# Patient Record
Sex: Female | Born: 1935
Health system: Southern US, Community
[De-identification: ages and names within clinical notes are randomized; demographics above are authoritative.]

## PROBLEM LIST (undated history)

## (undated) DIAGNOSIS — F32A Depression, unspecified: Secondary | ICD-10-CM

## (undated) DIAGNOSIS — I10 Essential (primary) hypertension: Secondary | ICD-10-CM

## (undated) DIAGNOSIS — I890 Lymphedema, not elsewhere classified: Secondary | ICD-10-CM

## (undated) DIAGNOSIS — R519 Headache, unspecified: Secondary | ICD-10-CM

## (undated) DIAGNOSIS — I272 Pulmonary hypertension, unspecified: Secondary | ICD-10-CM

## (undated) DIAGNOSIS — E785 Hyperlipidemia, unspecified: Secondary | ICD-10-CM

## (undated) DIAGNOSIS — N281 Cyst of kidney, acquired: Secondary | ICD-10-CM

## (undated) DIAGNOSIS — D649 Anemia, unspecified: Secondary | ICD-10-CM

## (undated) DIAGNOSIS — M1990 Unspecified osteoarthritis, unspecified site: Secondary | ICD-10-CM

## (undated) DIAGNOSIS — K760 Fatty (change of) liver, not elsewhere classified: Secondary | ICD-10-CM

## (undated) DIAGNOSIS — I82409 Acute embolism and thrombosis of unspecified deep veins of unspecified lower extremity: Secondary | ICD-10-CM

## (undated) DIAGNOSIS — I509 Heart failure, unspecified: Secondary | ICD-10-CM

## (undated) DIAGNOSIS — R0600 Dyspnea, unspecified: Secondary | ICD-10-CM

## (undated) DIAGNOSIS — F419 Anxiety disorder, unspecified: Secondary | ICD-10-CM

## (undated) DIAGNOSIS — E039 Hypothyroidism, unspecified: Secondary | ICD-10-CM

## (undated) DIAGNOSIS — R918 Other nonspecific abnormal finding of lung field: Secondary | ICD-10-CM

## (undated) DIAGNOSIS — R51 Headache: Secondary | ICD-10-CM

## (undated) DIAGNOSIS — R490 Dysphonia: Secondary | ICD-10-CM

## (undated) DIAGNOSIS — K5900 Constipation, unspecified: Secondary | ICD-10-CM

## (undated) DIAGNOSIS — E7800 Pure hypercholesterolemia, unspecified: Secondary | ICD-10-CM

## (undated) DIAGNOSIS — F329 Major depressive disorder, single episode, unspecified: Secondary | ICD-10-CM

## (undated) DIAGNOSIS — R079 Chest pain, unspecified: Secondary | ICD-10-CM

## (undated) DIAGNOSIS — G4733 Obstructive sleep apnea (adult) (pediatric): Secondary | ICD-10-CM

## (undated) DIAGNOSIS — M5030 Other cervical disc degeneration, unspecified cervical region: Secondary | ICD-10-CM

## (undated) DIAGNOSIS — N200 Calculus of kidney: Secondary | ICD-10-CM

## (undated) DIAGNOSIS — N3010 Interstitial cystitis (chronic) without hematuria: Secondary | ICD-10-CM

## (undated) DIAGNOSIS — I519 Heart disease, unspecified: Secondary | ICD-10-CM

## (undated) HISTORY — PX: REPLACEMENT TOTAL KNEE: SUR1224

## (undated) HISTORY — PX: HEMORRHOID SURGERY: SHX153

## (undated) HISTORY — DX: Cyst of kidney, acquired: N28.1

## (undated) HISTORY — DX: Calculus of kidney: N20.0

## (undated) HISTORY — DX: Essential (primary) hypertension: I10

## (undated) HISTORY — PX: OTHER SURGICAL HISTORY: SHX169

## (undated) HISTORY — PX: BACK SURGERY: SHX140

## (undated) HISTORY — PX: BREAST REDUCTION SURGERY: SHX8

## (undated) HISTORY — PX: ABDOMINAL HYSTERECTOMY: SHX81

## (undated) HISTORY — DX: Other nonspecific abnormal finding of lung field: R91.8

## (undated) HISTORY — DX: Depression, unspecified: F32.A

## (undated) HISTORY — DX: Interstitial cystitis (chronic) without hematuria: N30.10

## (undated) HISTORY — PX: TONSILECTOMY/ADENOIDECTOMY WITH MYRINGOTOMY: SHX6125

## (undated) HISTORY — DX: Fatty (change of) liver, not elsewhere classified: K76.0

## (undated) HISTORY — DX: Anemia, unspecified: D64.9

## (undated) HISTORY — PX: APPENDECTOMY: SHX54

## (undated) HISTORY — DX: Anxiety disorder, unspecified: F41.9

## (undated) HISTORY — PX: EYE SURGERY: SHX253

## (undated) HISTORY — DX: Unspecified osteoarthritis, unspecified site: M19.90

## (undated) HISTORY — PX: CARDIAC CATHETERIZATION: SHX172

## (undated) HISTORY — DX: Pulmonary hypertension, unspecified: I27.20

## (undated) HISTORY — DX: Major depressive disorder, single episode, unspecified: F32.9

## (undated) HISTORY — PX: FRACTURE SURGERY: SHX138

## (undated) HISTORY — DX: Hyperlipidemia, unspecified: E78.5

## (undated) HISTORY — PX: KNEE ARTHROSCOPY: SUR90

## (undated) HISTORY — PX: JOINT REPLACEMENT: SHX530

## (undated) HISTORY — PX: EXCISIONAL HEMORRHOIDECTOMY: SHX1541

## (undated) HISTORY — PX: REDUCTION MAMMAPLASTY: SUR839

## (undated) HISTORY — DX: Hypothyroidism, unspecified: E03.9

## (undated) HISTORY — PX: CERVICAL SPINE SURGERY: SHX589

## (undated) MED FILL — Iron Sucrose Inj 20 MG/ML (Fe Equiv): INTRAVENOUS | Qty: 10 | Status: AC

---

## 1999-10-06 ENCOUNTER — Encounter: Payer: Self-pay | Admitting: Internal Medicine

## 1999-10-06 ENCOUNTER — Encounter: Admission: RE | Admit: 1999-10-06 | Discharge: 1999-10-06 | Payer: Self-pay | Admitting: Internal Medicine

## 2004-07-31 ENCOUNTER — Ambulatory Visit: Payer: Self-pay | Admitting: Internal Medicine

## 2004-08-21 ENCOUNTER — Ambulatory Visit: Payer: Self-pay | Admitting: Internal Medicine

## 2004-11-29 ENCOUNTER — Ambulatory Visit: Payer: Self-pay

## 2005-01-24 ENCOUNTER — Emergency Department: Payer: Self-pay | Admitting: Emergency Medicine

## 2005-03-08 ENCOUNTER — Ambulatory Visit: Payer: Self-pay | Admitting: Gastroenterology

## 2005-03-13 ENCOUNTER — Ambulatory Visit: Payer: Self-pay | Admitting: Gastroenterology

## 2005-03-27 ENCOUNTER — Emergency Department: Payer: Self-pay | Admitting: Emergency Medicine

## 2005-05-22 ENCOUNTER — Emergency Department: Payer: Self-pay | Admitting: Emergency Medicine

## 2005-05-22 ENCOUNTER — Other Ambulatory Visit: Payer: Self-pay

## 2005-05-23 ENCOUNTER — Ambulatory Visit: Payer: Self-pay | Admitting: Emergency Medicine

## 2005-06-18 ENCOUNTER — Ambulatory Visit: Payer: Self-pay | Admitting: Internal Medicine

## 2005-08-09 ENCOUNTER — Ambulatory Visit (HOSPITAL_COMMUNITY): Admission: RE | Admit: 2005-08-09 | Discharge: 2005-08-09 | Payer: Self-pay | Admitting: Urology

## 2005-08-09 ENCOUNTER — Ambulatory Visit (HOSPITAL_BASED_OUTPATIENT_CLINIC_OR_DEPARTMENT_OTHER): Admission: RE | Admit: 2005-08-09 | Discharge: 2005-08-09 | Payer: Self-pay | Admitting: Urology

## 2005-10-23 ENCOUNTER — Ambulatory Visit: Payer: Self-pay | Admitting: Gastroenterology

## 2005-11-18 ENCOUNTER — Ambulatory Visit: Payer: Self-pay | Admitting: Internal Medicine

## 2006-03-14 ENCOUNTER — Ambulatory Visit: Payer: Self-pay | Admitting: General Practice

## 2006-03-14 ENCOUNTER — Other Ambulatory Visit: Payer: Self-pay

## 2006-03-27 ENCOUNTER — Ambulatory Visit: Payer: Self-pay | Admitting: Internal Medicine

## 2006-04-02 ENCOUNTER — Encounter: Payer: Self-pay | Admitting: General Practice

## 2006-05-03 ENCOUNTER — Encounter: Payer: Self-pay | Admitting: General Practice

## 2006-06-02 ENCOUNTER — Ambulatory Visit: Payer: Self-pay | Admitting: Internal Medicine

## 2006-06-23 ENCOUNTER — Ambulatory Visit: Payer: Self-pay | Admitting: Internal Medicine

## 2006-08-05 ENCOUNTER — Ambulatory Visit (HOSPITAL_BASED_OUTPATIENT_CLINIC_OR_DEPARTMENT_OTHER): Admission: RE | Admit: 2006-08-05 | Discharge: 2006-08-05 | Payer: Self-pay

## 2006-10-27 ENCOUNTER — Encounter: Payer: Self-pay | Admitting: Internal Medicine

## 2006-11-01 ENCOUNTER — Encounter: Payer: Self-pay | Admitting: Internal Medicine

## 2006-12-02 ENCOUNTER — Encounter: Payer: Self-pay | Admitting: Internal Medicine

## 2006-12-19 ENCOUNTER — Ambulatory Visit: Payer: Self-pay

## 2007-01-21 ENCOUNTER — Encounter: Payer: Self-pay | Admitting: Orthopedic Surgery

## 2007-02-01 ENCOUNTER — Encounter: Payer: Self-pay | Admitting: Orthopedic Surgery

## 2007-03-03 ENCOUNTER — Encounter: Payer: Self-pay | Admitting: Orthopedic Surgery

## 2007-04-03 ENCOUNTER — Encounter: Payer: Self-pay | Admitting: Orthopedic Surgery

## 2007-06-10 ENCOUNTER — Encounter: Payer: Self-pay | Admitting: Internal Medicine

## 2007-06-18 ENCOUNTER — Ambulatory Visit: Payer: Self-pay | Admitting: Physical Medicine and Rehabilitation

## 2007-06-26 ENCOUNTER — Ambulatory Visit: Payer: Self-pay | Admitting: Internal Medicine

## 2007-07-01 ENCOUNTER — Ambulatory Visit: Payer: Self-pay | Admitting: Internal Medicine

## 2007-07-04 ENCOUNTER — Encounter: Payer: Self-pay | Admitting: Internal Medicine

## 2007-07-17 ENCOUNTER — Ambulatory Visit: Payer: Self-pay | Admitting: Internal Medicine

## 2007-08-03 ENCOUNTER — Encounter: Payer: Self-pay | Admitting: Internal Medicine

## 2007-08-04 ENCOUNTER — Ambulatory Visit: Payer: Self-pay | Admitting: Internal Medicine

## 2007-08-10 ENCOUNTER — Ambulatory Visit: Payer: Self-pay | Admitting: Internal Medicine

## 2007-09-03 ENCOUNTER — Encounter: Payer: Self-pay | Admitting: Internal Medicine

## 2007-12-30 ENCOUNTER — Ambulatory Visit: Payer: Self-pay | Admitting: Internal Medicine

## 2008-02-03 ENCOUNTER — Encounter: Payer: Self-pay | Admitting: Internal Medicine

## 2008-03-02 ENCOUNTER — Encounter: Payer: Self-pay | Admitting: Internal Medicine

## 2008-05-05 ENCOUNTER — Ambulatory Visit: Payer: Self-pay | Admitting: Specialist

## 2008-06-28 ENCOUNTER — Ambulatory Visit: Payer: Self-pay | Admitting: Internal Medicine

## 2008-10-12 ENCOUNTER — Ambulatory Visit: Payer: Self-pay | Admitting: Internal Medicine

## 2008-11-11 ENCOUNTER — Ambulatory Visit: Payer: Self-pay | Admitting: Specialist

## 2008-12-26 ENCOUNTER — Ambulatory Visit: Payer: Self-pay | Admitting: Surgery

## 2009-04-05 LAB — HM PAP SMEAR: HM Pap smear: NEGATIVE

## 2009-04-07 ENCOUNTER — Ambulatory Visit: Payer: Self-pay | Admitting: Internal Medicine

## 2009-05-31 ENCOUNTER — Ambulatory Visit: Payer: Self-pay | Admitting: Specialist

## 2009-06-13 ENCOUNTER — Ambulatory Visit: Payer: Self-pay | Admitting: Gastroenterology

## 2009-06-30 ENCOUNTER — Ambulatory Visit: Payer: Self-pay | Admitting: Internal Medicine

## 2009-11-27 ENCOUNTER — Ambulatory Visit: Payer: Self-pay | Admitting: Specialist

## 2009-12-08 ENCOUNTER — Ambulatory Visit: Payer: Self-pay | Admitting: Gastroenterology

## 2009-12-13 ENCOUNTER — Encounter: Payer: Self-pay | Admitting: Unknown Physician Specialty

## 2009-12-18 ENCOUNTER — Ambulatory Visit: Payer: Self-pay | Admitting: Gastroenterology

## 2009-12-31 ENCOUNTER — Encounter: Payer: Self-pay | Admitting: Unknown Physician Specialty

## 2010-01-31 ENCOUNTER — Encounter: Payer: Self-pay | Admitting: Unknown Physician Specialty

## 2010-02-14 ENCOUNTER — Ambulatory Visit: Payer: Self-pay | Admitting: General Surgery

## 2010-04-27 ENCOUNTER — Emergency Department: Payer: Self-pay | Admitting: Emergency Medicine

## 2010-05-03 ENCOUNTER — Ambulatory Visit: Payer: Self-pay | Admitting: Specialist

## 2010-06-25 ENCOUNTER — Ambulatory Visit: Payer: Self-pay | Admitting: Otolaryngology

## 2010-07-02 ENCOUNTER — Ambulatory Visit: Payer: Self-pay | Admitting: Internal Medicine

## 2010-07-24 ENCOUNTER — Ambulatory Visit: Payer: Self-pay | Admitting: Internal Medicine

## 2011-02-16 ENCOUNTER — Emergency Department: Payer: Self-pay | Admitting: Internal Medicine

## 2011-04-01 LAB — PULMONARY FUNCTION TEST

## 2011-07-04 ENCOUNTER — Ambulatory Visit: Payer: Self-pay | Admitting: Internal Medicine

## 2011-07-09 ENCOUNTER — Ambulatory Visit: Payer: Self-pay | Admitting: Cardiology

## 2011-08-23 ENCOUNTER — Ambulatory Visit: Payer: Self-pay | Admitting: Gastroenterology

## 2011-08-26 ENCOUNTER — Inpatient Hospital Stay: Payer: Self-pay | Admitting: Orthopedic Surgery

## 2011-08-30 ENCOUNTER — Encounter: Payer: Self-pay | Admitting: Internal Medicine

## 2011-09-03 ENCOUNTER — Encounter: Payer: Self-pay | Admitting: Internal Medicine

## 2011-09-03 HISTORY — PX: HIP SURGERY: SHX245

## 2011-09-17 LAB — URINALYSIS, COMPLETE
Bilirubin,UR: NEGATIVE
Blood: NEGATIVE
Nitrite: NEGATIVE
Ph: 7 (ref 4.5–8.0)
Protein: NEGATIVE

## 2011-09-25 ENCOUNTER — Ambulatory Visit: Payer: Self-pay | Admitting: Internal Medicine

## 2011-09-25 ENCOUNTER — Other Ambulatory Visit: Payer: Self-pay | Admitting: Orthopedic Surgery

## 2011-09-27 LAB — VANCOMYCIN, TROUGH: Vancomycin, Trough: 7 ug/mL — ABNORMAL LOW (ref 10–20)

## 2011-09-27 LAB — CREATININE, SERUM
Creatinine: 0.73 mg/dL (ref 0.60–1.30)
EGFR (African American): >60

## 2011-09-29 LAB — CREATININE, SERUM
Creatinine: 0.89 mg/dL (ref 0.60–1.30)
EGFR (African American): >60
EGFR (Non-African Amer.): >60

## 2011-09-29 LAB — VANCOMYCIN, TROUGH: Vancomycin, Trough: 22 ug/mL (ref 10–20)

## 2011-09-29 LAB — WOUND CULTURE

## 2011-09-30 LAB — URINALYSIS, COMPLETE
Bilirubin,UR: NEGATIVE
Blood: NEGATIVE
Glucose,UR: NEGATIVE mg/dL (ref 0–75)
Hyaline Cast: 1
Nitrite: NEGATIVE
Specific Gravity: 1.008 (ref 1.003–1.030)
Squamous Epithelial: 4
WBC UR: 6 /HPF (ref 0–5)

## 2011-10-01 LAB — URINE CULTURE

## 2011-10-02 LAB — VANCOMYCIN, TROUGH: Vancomycin, Trough: 16 ug/mL (ref 10–20)

## 2011-10-02 LAB — CREATININE, SERUM
EGFR (African American): >60
EGFR (Non-African Amer.): 56 — ABNORMAL LOW

## 2011-10-03 LAB — SEDIMENTATION RATE: Erythrocyte Sed Rate: 62 mm/hr — ABNORMAL HIGH (ref 0–30)

## 2011-10-04 ENCOUNTER — Encounter: Payer: Self-pay | Admitting: Internal Medicine

## 2011-10-06 LAB — URINALYSIS, COMPLETE
Bacteria: NONE SEEN
Bilirubin,UR: NEGATIVE
Blood: NEGATIVE
Glucose,UR: NEGATIVE mg/dL (ref 0–75)
Ketone: NEGATIVE
Nitrite: NEGATIVE
RBC,UR: NONE SEEN /HPF (ref 0–5)
Specific Gravity: 1.006 (ref 1.003–1.030)
Squamous Epithelial: NONE SEEN
WBC UR: <1 /HPF (ref 0–5)

## 2011-10-09 LAB — CBC WITH DIFFERENTIAL/PLATELET
Basophil #: 0 10*3/uL (ref 0.0–0.1)
Basophil %: 0.8 %
Eosinophil #: 0.4 10*3/uL (ref 0.0–0.7)
HGB: 9 g/dL — ABNORMAL LOW (ref 12.0–16.0)
Lymphocyte %: 37.6 %
MCH: 31.8 pg (ref 26.0–34.0)
MCHC: 33.2 g/dL (ref 32.0–36.0)
MCV: 96 fL (ref 80–100)
Monocyte #: 0.5 10*3/uL (ref 0.0–0.7)
Neutrophil %: 44.5 %
Platelet: 189 10*3/uL (ref 150–440)
RBC: 2.82 10*6/uL — ABNORMAL LOW (ref 3.80–5.20)

## 2011-10-09 LAB — BASIC METABOLIC PANEL
BUN: 11 mg/dL (ref 7–18)
Calcium, Total: 9.1 mg/dL (ref 8.5–10.1)
Creatinine: 1.12 mg/dL (ref 0.60–1.30)
EGFR (African American): >60
EGFR (Non-African Amer.): 50 — ABNORMAL LOW
Glucose: 76 mg/dL (ref 65–99)
Potassium: 2.8 mmol/L — ABNORMAL LOW (ref 3.5–5.1)
Sodium: 141 mmol/L (ref 136–145)

## 2011-10-14 LAB — CBC WITH DIFFERENTIAL/PLATELET
Basophil #: 0 10*3/uL (ref 0.0–0.1)
Eosinophil %: 10.3 %
HCT: 26.8 % — ABNORMAL LOW (ref 35.0–47.0)
Lymphocyte #: 2.2 10*3/uL (ref 1.0–3.6)
MCV: 96 fL (ref 80–100)
Monocyte #: 0.6 10*3/uL (ref 0.0–0.7)
Monocyte %: 8.4 %
Neutrophil #: 3.5 10*3/uL (ref 1.4–6.5)
RBC: 2.8 10*6/uL — ABNORMAL LOW (ref 3.80–5.20)
RDW: 16.1 % — ABNORMAL HIGH (ref 11.5–14.5)
WBC: 7.1 10*3/uL (ref 3.6–11.0)

## 2011-10-14 LAB — SEDIMENTATION RATE: Erythrocyte Sed Rate: 54 mm/hr — ABNORMAL HIGH (ref 0–30)

## 2011-10-24 ENCOUNTER — Ambulatory Visit: Payer: Self-pay | Admitting: Orthopedic Surgery

## 2011-11-01 ENCOUNTER — Other Ambulatory Visit: Payer: Self-pay | Admitting: Gastroenterology

## 2011-11-01 LAB — CLOSTRIDIUM DIFFICILE BY PCR

## 2011-11-13 ENCOUNTER — Encounter: Payer: Self-pay | Admitting: Cardiothoracic Surgery

## 2011-11-13 ENCOUNTER — Encounter: Payer: Self-pay | Admitting: Nurse Practitioner

## 2011-12-02 ENCOUNTER — Encounter: Payer: Self-pay | Admitting: Nurse Practitioner

## 2011-12-02 ENCOUNTER — Encounter: Payer: Self-pay | Admitting: Cardiothoracic Surgery

## 2012-01-01 ENCOUNTER — Encounter: Payer: Self-pay | Admitting: Nurse Practitioner

## 2012-01-01 ENCOUNTER — Encounter: Payer: Self-pay | Admitting: Cardiothoracic Surgery

## 2012-02-03 ENCOUNTER — Ambulatory Visit: Payer: Self-pay | Admitting: Specialist

## 2012-02-05 ENCOUNTER — Ambulatory Visit: Payer: Self-pay | Admitting: Orthopedic Surgery

## 2012-04-02 ENCOUNTER — Ambulatory Visit: Payer: Self-pay | Admitting: Orthopedic Surgery

## 2012-04-09 ENCOUNTER — Inpatient Hospital Stay: Payer: Self-pay | Admitting: Orthopedic Surgery

## 2012-04-09 LAB — HEMOGLOBIN: HGB: 10.6 g/dL — ABNORMAL LOW (ref 12.0–16.0)

## 2012-04-10 LAB — BASIC METABOLIC PANEL
BUN: 13 mg/dL (ref 7–18)
Calcium, Total: 7.5 mg/dL — ABNORMAL LOW (ref 8.5–10.1)
Chloride: 106 mmol/L (ref 98–107)
EGFR (African American): >60
EGFR (Non-African Amer.): >60
Glucose: 129 mg/dL — ABNORMAL HIGH (ref 65–99)
Osmolality: 281 (ref 275–301)
Sodium: 140 mmol/L (ref 136–145)

## 2012-04-10 LAB — PLATELET COUNT: Platelet: 151 10*3/uL (ref 150–440)

## 2012-04-10 LAB — HEMOGLOBIN: HGB: 8.8 g/dL — ABNORMAL LOW (ref 12.0–16.0)

## 2012-04-11 LAB — HEMOGLOBIN: HGB: 8.3 g/dL — ABNORMAL LOW (ref 12.0–16.0)

## 2012-04-12 LAB — HEMOGLOBIN: HGB: 7 g/dL — ABNORMAL LOW (ref 12.0–16.0)

## 2012-04-13 LAB — HEMOGLOBIN: HGB: 7.7 g/dL — ABNORMAL LOW

## 2012-04-14 ENCOUNTER — Encounter: Payer: Self-pay | Admitting: Internal Medicine

## 2012-04-14 LAB — HEMOGLOBIN: HGB: 8.6 g/dL — ABNORMAL LOW (ref 12.0–16.0)

## 2012-06-03 ENCOUNTER — Ambulatory Visit: Payer: Self-pay | Admitting: Gastroenterology

## 2012-06-08 ENCOUNTER — Telehealth: Payer: Self-pay | Admitting: Internal Medicine

## 2012-06-08 DIAGNOSIS — Z139 Encounter for screening, unspecified: Secondary | ICD-10-CM

## 2012-06-08 NOTE — Telephone Encounter (Signed)
I placed an order for a screening mammogram on this pt - per her request.  She would like a 10:30-11:00 appt.  See orders.  Thanks.

## 2012-06-08 NOTE — Telephone Encounter (Signed)
Pt called she received letter from [facility] stated it time for her mammogram Pt needs order Pt would like 10:30 - 11 appointment

## 2012-06-16 NOTE — Telephone Encounter (Signed)
Patient scheduled at [facility].

## 2012-06-22 ENCOUNTER — Other Ambulatory Visit: Payer: Self-pay | Admitting: Internal Medicine

## 2012-06-22 NOTE — Telephone Encounter (Signed)
Patient has an appt on 07/03/2012 with Dr. Nicki Reaper, ok to refill until appt?

## 2012-06-23 NOTE — Telephone Encounter (Signed)
Ok to refill x 3 

## 2012-06-24 ENCOUNTER — Other Ambulatory Visit: Payer: Self-pay | Admitting: Internal Medicine

## 2012-06-24 NOTE — Telephone Encounter (Signed)
Refill on Lipitor (generic) 20 mg.

## 2012-06-25 ENCOUNTER — Ambulatory Visit: Payer: Self-pay | Admitting: Orthopedic Surgery

## 2012-07-01 MED ORDER — ATORVASTATIN CALCIUM 20 MG PO TABS: 20.0000 mg | ORAL_TABLET | Freq: Every day | ORAL | Status: DC

## 2012-07-01 NOTE — Telephone Encounter (Signed)
Ok to refill x 4

## 2012-07-01 NOTE — Telephone Encounter (Signed)
Already sent in to pharmacy.

## 2012-07-03 ENCOUNTER — Ambulatory Visit (INDEPENDENT_AMBULATORY_CARE_PROVIDER_SITE_OTHER): Payer: Medicare Other | Admitting: Internal Medicine

## 2012-07-03 ENCOUNTER — Encounter: Payer: Self-pay | Admitting: Internal Medicine

## 2012-07-03 ENCOUNTER — Other Ambulatory Visit: Payer: Self-pay | Admitting: *Deleted

## 2012-07-03 VITALS — BP 148/62 | HR 84 | Temp 98.4°F | Ht 64.0 in | Wt 180.0 lb

## 2012-07-03 DIAGNOSIS — I2789 Other specified pulmonary heart diseases: Secondary | ICD-10-CM

## 2012-07-03 DIAGNOSIS — Z23 Encounter for immunization: Secondary | ICD-10-CM

## 2012-07-03 DIAGNOSIS — I272 Pulmonary hypertension, unspecified: Secondary | ICD-10-CM

## 2012-07-03 DIAGNOSIS — E7800 Pure hypercholesterolemia, unspecified: Secondary | ICD-10-CM

## 2012-07-03 DIAGNOSIS — D649 Anemia, unspecified: Secondary | ICD-10-CM

## 2012-07-03 DIAGNOSIS — Z0000 Encounter for general adult medical examination without abnormal findings: Secondary | ICD-10-CM

## 2012-07-03 DIAGNOSIS — I10 Essential (primary) hypertension: Secondary | ICD-10-CM

## 2012-07-03 DIAGNOSIS — R918 Other nonspecific abnormal finding of lung field: Secondary | ICD-10-CM

## 2012-07-03 DIAGNOSIS — R5381 Other malaise: Secondary | ICD-10-CM

## 2012-07-03 DIAGNOSIS — M1990 Unspecified osteoarthritis, unspecified site: Secondary | ICD-10-CM

## 2012-07-03 DIAGNOSIS — K5900 Constipation, unspecified: Secondary | ICD-10-CM

## 2012-07-03 DIAGNOSIS — E039 Hypothyroidism, unspecified: Secondary | ICD-10-CM

## 2012-07-03 DIAGNOSIS — Z139 Encounter for screening, unspecified: Secondary | ICD-10-CM

## 2012-07-03 DIAGNOSIS — K5909 Other constipation: Secondary | ICD-10-CM

## 2012-07-03 DIAGNOSIS — R5383 Other fatigue: Secondary | ICD-10-CM

## 2012-07-03 LAB — CBC WITH DIFFERENTIAL/PLATELET
Basophils Relative: 0 % (ref 0–1)
Eosinophils Absolute: 0.3 10*3/uL (ref 0.0–0.7)
Eosinophils Relative: 3 % (ref 0–5)
Lymphs Abs: 2.7 10*3/uL (ref 0.7–4.0)
MCH: 30.7 pg (ref 26.0–34.0)
MCHC: 32.6 g/dL (ref 30.0–36.0)
MCV: 94.1 fL (ref 78.0–100.0)
Monocytes Relative: 8 % (ref 3–12)
Neutrophils Relative %: 61 % (ref 43–77)
Platelets: 235 10*3/uL (ref 150–400)
RBC: 3.55 MIL/uL — ABNORMAL LOW (ref 3.87–5.11)

## 2012-07-03 MED ORDER — BENAZEPRIL HCL 40 MG PO TABS: 40.0000 mg | ORAL_TABLET | Freq: Every day | ORAL | Status: DC

## 2012-07-03 MED ORDER — AMLODIPINE BESYLATE 10 MG PO TABS: 10.0000 mg | ORAL_TABLET | Freq: Every day | ORAL | Status: DC

## 2012-07-03 MED ORDER — METOPROLOL SUCCINATE ER 100 MG PO TB24: 100.0000 mg | ORAL_TABLET | Freq: Every day | ORAL | Status: DC

## 2012-07-03 MED ORDER — ATORVASTATIN CALCIUM 20 MG PO TABS: 20.0000 mg | ORAL_TABLET | Freq: Every day | ORAL | Status: DC

## 2012-07-03 MED ORDER — HYDROCHLOROTHIAZIDE 25 MG PO TABS: 25.0000 mg | ORAL_TABLET | Freq: Every day | ORAL | Status: DC

## 2012-07-03 MED ORDER — LEVOTHYROXINE SODIUM 100 MCG PO TABS: 100.0000 ug | ORAL_TABLET | Freq: Every day | ORAL | Status: DC

## 2012-07-04 ENCOUNTER — Encounter: Payer: Self-pay | Admitting: Internal Medicine

## 2012-07-04 ENCOUNTER — Other Ambulatory Visit: Payer: Self-pay | Admitting: Internal Medicine

## 2012-07-04 DIAGNOSIS — R918 Other nonspecific abnormal finding of lung field: Secondary | ICD-10-CM | POA: Insufficient documentation

## 2012-07-04 DIAGNOSIS — I10 Essential (primary) hypertension: Secondary | ICD-10-CM | POA: Insufficient documentation

## 2012-07-04 DIAGNOSIS — D649 Anemia, unspecified: Secondary | ICD-10-CM | POA: Insufficient documentation

## 2012-07-04 DIAGNOSIS — E785 Hyperlipidemia, unspecified: Secondary | ICD-10-CM | POA: Insufficient documentation

## 2012-07-04 DIAGNOSIS — E039 Hypothyroidism, unspecified: Secondary | ICD-10-CM | POA: Insufficient documentation

## 2012-07-04 DIAGNOSIS — I272 Pulmonary hypertension, unspecified: Secondary | ICD-10-CM | POA: Insufficient documentation

## 2012-07-04 DIAGNOSIS — M1990 Unspecified osteoarthritis, unspecified site: Secondary | ICD-10-CM | POA: Insufficient documentation

## 2012-07-04 DIAGNOSIS — K5909 Other constipation: Secondary | ICD-10-CM | POA: Insufficient documentation

## 2012-07-04 NOTE — Progress Notes (Signed)
  Subjective:    Patient ID: Kimberly Roy, female    DOB: [birth year], 78 y.o.   MRN: [id number]  HPI 78 year old female with past history of hypertension, hypercholesterolemia, hypothyroidism, anemia, chronic constipation and pulmonary hypertension (new diagnosis).  She comes in today for scheduled follow up.  Accompanied by her daughter.  History obtained from both of them.  She had questions regarding the new diagnosis of pulmonary hypertension.  Had a right heart cath that revealed elevated pulmonary artery pressure.  Pt reported - mild.  Saw Dr Raul Del.  Wanted her to start Adcirca.  She request to be referred to another pulmonologist to discuss the diagnosis and treatment recommendations.  Breathing is stable.  No chest pain or tightness.  No nausea or vomiting.  Persistent problems with constipation.  Is on chronic narcotic pain meds.  Saw Dawn Aline Brochure this past week.  Planning to start Relistor.  No blood in the stool.  Does report increased fatigue.  History of anemia.  Was recently in rehab.  Synthroid changed to generic.  She is concerned that her "levels may be off".  Wants checked today.  No increased abdominal pain currently.  Swelling improved.  Wearing her compression stockings.     Past Medical History  Diagnosis Date  . Osteoarthritis     knees/cervical and lumbar spine  . Hypertension   . Hyperlipidemia   . Hypothyroidism   . Anemia   . Depression   . Anxiety   . Nephrolithiasis   . Interstitial cystitis   . Pulmonary nodules     followed by Dr Raul Del  . Fatty liver   . Renal cyst     right  . Pulmonary hypertension     Review of Systems Patient denies any headache, lightheadedness or dizziness.  No chest pain, tightness or palpitations. Breathing is stable.  No significant increased cough.  Some allergy symptoms - controlled with Nasonex.  No nausea or vomiting.  No abdominal pain or cramping currently.  No BRBPR or melana.  Persistent problems with constipation.   No  urine change.  No vaginal problems.        Objective:   Physical Exam Filed Vitals:   07/03/12 1435  BP: 148/62  Temp: 98.4 F (45.28 C)   78 year old female in no acute distress.   HEENT:  Nares - clear.  OP- without lesions or erythema.  NECK:  Supple, nontender.  No audible bruit.   HEART:  Appears to be regular. LUNGS:  Without crackles or wheezing audible.  Respirations even and unlabored.   RADIAL PULSE:  Equal bilaterally.  ABDOMEN:  Soft, nontender.  No audible abdominal bruit.   EXTREMITIES:  No increased edema to be present.  Improved.  Support hose in place.      Assessment & Plan:  HEALTH MAINTENANCE.  Will review records from outside.  Keep on track with her physicals.  Colonoscopy as outlined.    CARDIOVASCULAR.  Sees Dr Saralyn Pilar.  Currently symptoms stable.  Continue risk factor modification.

## 2012-07-04 NOTE — Assessment & Plan Note (Addendum)
Hgb has been stable around 10.  Recent iron studies and B12 (10/13) wnl.  Folate wnl. Just saw [employer].  Is followed by GI (Dr Candace Cruise).  Colonoscopy 06/03/12 - prep inadequate.  Diverticulosis in the sigmoid colon.  Exam otherwise normal.  EGD 06/03/12 - normal.  Check cbc today to confirm stable.

## 2012-07-04 NOTE — Patient Instructions (Signed)
Will check thyroid function and blood count today.  Will schedule an appt with pulmonary.

## 2012-07-04 NOTE — Assessment & Plan Note (Signed)
On Lipitor.  Low cholesterol diet.  Follow lipid panel and liver function. Will check lipid panel with next fasting labs.

## 2012-07-04 NOTE — Assessment & Plan Note (Signed)
Persistent problem for her.  Sees GI.  Planning to try Methylnaltrexone (Relistor).  Takes chronic narcotic pain meds.

## 2012-07-04 NOTE — Assessment & Plan Note (Signed)
On Levothyroxine now.  (Not on name brand Synthroid).  Check tsh today.

## 2012-07-04 NOTE — Assessment & Plan Note (Signed)
Followed by Dr Jefm Bryant.  Chronic pain on chronic pain meds.  CT thoracic spine revealed multilevel/multifactorial regions of spondylolysis and hypertrophic bone formation.  Findings consistent with chronic compression deformities within the lumbar spine.  Compression deformities involve T11 and T10.  No acute abnormality noted.

## 2012-07-04 NOTE — Assessment & Plan Note (Signed)
Recently diagnosed.  Saw Dr Saralyn Pilar.  Had a right heart cath.  Elevated pulmonary artery pressure.  (No records available of actual measurement.  Pt and her daughter report - mild).  Referred to Dr Raul Del.  Discussed starting Adcirca (Tadalafil) 79m bid.  Pt had questions regarding starting this medication and request referral to another pulmonologist to discuss the diagnosis and treatment recommendations.  Will schedule an appt with Dr MLake Bells  Breathing stable.

## 2012-07-04 NOTE — Assessment & Plan Note (Signed)
Has been followed by Dr Raul Del.  CT chest (05/19/12) reveals several small nodules in the periphery of the lower lobes with the largest on the right measuring .6cm and largest on the left .7cm - unchanged from 05/31/09.

## 2012-07-04 NOTE — Assessment & Plan Note (Signed)
Blood pressure has been low lately.  Readings from home averaging - 114-120s/50-60.  Will decrease Norvasc to 81m q day.  Follow blood pressures.  Send in readings.  Will continue to adjust as needed.  Continue the remainder of the med regimen as she is doing.  She did report that if she takes Lasix (she does no take the HCTZ).  She has been on daily lasix lately.

## 2012-07-06 LAB — VARICELLA ZOSTER ANTIBODY, IGG: Varicella IgG: 3.63 {ISR} — ABNORMAL HIGH (ref <0.90)

## 2012-07-07 ENCOUNTER — Ambulatory Visit (INDEPENDENT_AMBULATORY_CARE_PROVIDER_SITE_OTHER): Payer: Medicare Other | Admitting: Pulmonary Disease

## 2012-07-07 ENCOUNTER — Encounter: Payer: Self-pay | Admitting: Pulmonary Disease

## 2012-07-07 VITALS — BP 138/64 | HR 63 | Temp 98.0°F | Ht 60.0 in | Wt 228.0 lb

## 2012-07-07 DIAGNOSIS — I272 Pulmonary hypertension, unspecified: Secondary | ICD-10-CM

## 2012-07-07 DIAGNOSIS — I2789 Other specified pulmonary heart diseases: Secondary | ICD-10-CM

## 2012-07-07 DIAGNOSIS — G4733 Obstructive sleep apnea (adult) (pediatric): Secondary | ICD-10-CM

## 2012-07-07 NOTE — Assessment & Plan Note (Addendum)
Kimberly Roy is a very pleasant female with multiple comorbid illnesses including obesity and deconditioning who has recently been given the diagnosis of pulmonary hypertension.  She is referred to [place] for a second opinion on whether or not she should be treated for it.   Based on her right heart catheterization from November 2012 it is evident that she has mildly elevated right heart pressures with a mean PA pressure of 35 and 2.99 Woods units and a slightly decreased cardiac index at 2.3 L per minute per meter squared.  Pulmonary hypertension is often the endpoint of a spectrum of illnesses and can sometimes be idiopathic. The idiopathic cases are the most likely to benefit from vasodilator medications such as tadalafil. There is very little high-quality evidence in the literature to support the use of pulmonary vasodilators in patients who have pulmonary hypertension secondary to some other illness.  In her case I think that is exceedingly likely that Kimberly Roy has pulmonary hypertension from her congestive heart failure (wedge pressure was 21 on her right heart catheterization) and underlying obstructive sleep apnea. We know already that she has overnight hypoxemia and because of her multiple pain issues she is unable to participate in a formal sleep study. Another remote possibility as a cause of her pulmonary hypertension could be chronic thromboembolic pulmonary hypertension given the fact that she has had joint replacements and it least one documented pulmonary embolism in the past.   We discussed this situation at length and I advised her that she should not take the tadalafil. If she wants treatment for her pulmonary hypertension (which may or may not be contributing to her shortness of breath) the first step would be to have a formal overnight sleep study.  I will continue to obtain records from Dr. Gust Brooms office to look for the remainder of the workup for both her shortness of breath and for  secondary causes of pulmonary hypertension.  We will see her back in 2 weeks and discuss her findings.  In the meantime she is not to take the tadalafil and I advised her to exercise and try to lose weight as best as possible. At this point she prefers to forego a polysomnogram.

## 2012-07-07 NOTE — Progress Notes (Signed)
Subjective:    Patient ID: Kimberly Roy, female    DOB: 1931-02-27, 78 y.o.   MRN: [id number]  HPI  Kimberly Roy is a 78 year old female who has pulmonary hypertension who is referred to [place] for a second opinion on whether or not it should be treated. She had no childhood respiratory illnesses and never smoked cigarettes. She has been short of breath ever since the late 80s when she had her first knee replacement. At some point in the 90s after another joint replacement she had a pulmonary embolism and was treated for this for 3-6 months. Since then she does not believe she has had another pulmonary embolism.  She is sent to [place] because recently she has been diagnosed with pulmonary hypertension and has been recommended that she take tadalafil. She is not sure she should take his medications as she wants to talk to me about.  She states that she has not noticed an increase in her baseline shortness of breath which as noted above has persisted since the late 1980s. She says that she thinks she is short of breath because she is "overweight and out of shape". She does not have a cough she does not have chest pain and she has minimal leg swelling. She says that she snores heavily at night and has never undergone a formal polysomnogram because she is unable to sleep in the supine position secondary to chronic pain issues.  She underwent a right heart catheterization and a left heart catheterization in November of 2012 and this showed a mean PA pressure of 35, a wedge pressure of 21, pulmonary vascular resistance of 2.99 Woods units, and a cardiac index that was decreased to 2.3 L per minute per meter squared.  Since having a right heart catheterization she unfortunately had a fall and a hip fracture and so treatment for her shortness of breath and pulmonary hypertension was put off until several months ago. She saw Dr. Raul Del for this and he recommended that she start taking tadalafil.  She states that she  snores heavily and has polyuria and often feels sleepy during the day. She has fatigue throughout the day as well.  Past Medical History  Diagnosis Date  . Osteoarthritis     knees/cervical and lumbar spine  . Hypertension   . Hyperlipidemia   . Hypothyroidism   . Anemia   . Depression   . Anxiety   . Nephrolithiasis   . Interstitial cystitis   . Pulmonary nodules     followed by Dr Raul Del  . Fatty liver   . Renal cyst     right  . Pulmonary hypertension      Family History  Problem Relation Age of Onset  . Heart disease Mother   . Stroke Mother   . Heart disease Father   . Hypertension       History   Social History  . Marital Status: Married    Spouse Name: N/A    Number of Children: N/A  . Years of Education: N/A   Occupational History  . Not on file.   Social History Main Topics  . Smoking status: Never Smoker   . Smokeless tobacco: Never Used  . Alcohol Use: No  . Drug Use: No  . Sexually Active: Not on file   Other Topics Concern  . Not on file   Social History Narrative  . No narrative on file     Allergies  Allergen Reactions  . Biaxin (Clarithromycin)   .  Levaquin (Levofloxacin)   . Lyrica (Pregabalin)   . Nucynta Er (Tapentadol Hcl Er) Other (See Comments)    Severe constipation   . Sulfa Antibiotics      Outpatient Prescriptions Prior to Visit  Medication Sig Dispense Refill  . alendronate (FOSAMAX) 70 MG tablet Take 70 mg by mouth every 7 (seven) days.       Marland Kitchen amitriptyline (ELAVIL) 25 MG tablet Take 25 mg by mouth at bedtime.      Marland Kitchen atorvastatin (LIPITOR) 20 MG tablet TAKE ONE TABLET AT BEDTIME  90 tablet  0  . benazepril (LOTENSIN) 40 MG tablet Take 1 tablet (40 mg total) by mouth daily.  90 tablet  1  . diclofenac sodium (VOLTAREN) 1 % GEL Apply topically.      . furosemide (LASIX) 20 MG tablet Take 20 mg by mouth daily as needed.       . hydrochlorothiazide (HYDRODIURIL) 25 MG tablet Take 1 tablet (25 mg total) by mouth daily.   90 tablet  1  . HYDROcodone-acetaminophen (NORCO) 10-325 MG per tablet Take 1 tablet by mouth every 4 (four) hours as needed.      Marland Kitchen levothyroxine (SYNTHROID, LEVOTHROID) 100 MCG tablet Take 1 tablet (100 mcg total) by mouth daily.  90 tablet  1  . metoprolol succinate (TOPROL-XL) 100 MG 24 hr tablet Take 100 mg by mouth 2 (two) times daily. Take with or immediately following a meal.      . mometasone (NASONEX) 50 MCG/ACT nasal spray Place 2 sprays into the nose daily.      Marland Kitchen morphine (MS CONTIN) 100 MG 12 hr tablet Take 100 mg by mouth 3 (three) times daily.      . Multiple Vitamins-Minerals (CENTRUM SILVER ADULT 50+ PO) Take by mouth daily.      . pantoprazole (PROTONIX) 40 MG injection Take 40 mg by mouth 2 (two) times daily.       . polyethylene glycol powder (MIRALAX) powder Take 17 g by mouth daily.      . sodium chloride (OCEAN) 0.65 % nasal spray Place 1 spray into the nose as needed.      . [DISCONTINUED] amLODipine (NORVASC) 10 MG tablet Take 1 tablet (10 mg total) by mouth daily.  90 tablet  1  . [DISCONTINUED] atorvastatin (LIPITOR) 20 MG tablet Take 1 tablet (20 mg total) by mouth daily.  90 tablet  1  . [DISCONTINUED] oxyCODONE (OXY IR/ROXICODONE) 5 MG immediate release tablet        Last reviewed on 07/07/2012  4:01 PM by [name], CMA    Review of Systems  Constitutional: Negative for fever, chills and unexpected weight change.  HENT: Negative for ear pain, nosebleeds, congestion, sore throat, rhinorrhea, sneezing, trouble swallowing, dental problem, voice change, postnasal drip and sinus pressure.   Eyes: Negative for visual disturbance.  Respiratory: Positive for shortness of breath. Negative for cough and choking.   Cardiovascular: Negative for chest pain and leg swelling.  Gastrointestinal: Positive for abdominal pain. Negative for vomiting and diarrhea.  Genitourinary: Negative for difficulty urinating.  Musculoskeletal: Positive for arthralgias.  Skin: Negative  for rash.  Neurological: Negative for tremors, syncope and headaches.  Hematological: Does not bruise/bleed easily.       Objective:   Physical Exam  Filed Vitals:   07/07/12 1603  BP: 138/64  Pulse: 63  Temp: 98 F (36.7 C)  TempSrc: Oral  Height: 5' (1.524 m)  Weight: 228 lb (103.42 kg)  SpO2: 97%  Gen: chronically ill appearing, obese, no acute distress HEENT: NCAT, PERRL, EOMi, OP clear, neck supple without masses PULM: Few insp crackles in bases CV: RRR, slight systolic murmur, no JVD AB: BS+, soft, nontender, no hsm Ext: warm, minimal edema in ankles, no clubbing, no cyanosis Derm: no rash or skin breakdown Neuro: A&Ox4, CN II-XII intact, strength 5/5 in all 4 extremities       Assessment & Plan:   Pulmonary hypertension Kimberly Roy is a very pleasant female with multiple comorbid illnesses including obesity and deconditioning who has recently been given the diagnosis of pulmonary hypertension.  She is referred to [place] for a second opinion on whether or not she should be treated for it.   Based on her right heart catheterization from November 2012 it is evident that she has mildly elevated right heart pressures with a mean PA pressure of 35 and 2.99 Woods units and a slightly decreased cardiac index at 2.3 L per minute per meter squared.  Pulmonary hypertension is often the endpoint of a spectrum of illnesses and can sometimes be idiopathic. The idiopathic cases are the most likely to benefit from vasodilator medications such as tadalafil. There is very little high-quality evidence in the literature to support the use of pulmonary vasodilators in patients who have pulmonary hypertension secondary to some other illness.  In her case I think that is exceedingly likely that Kimberly Roy has pulmonary hypertension from her congestive heart failure (wedge pressure was 21 on her right heart catheterization) and underlying obstructive sleep apnea. We know already that she has  overnight hypoxemia and because of her multiple pain issues she is unable to participate in a formal sleep study. Another remote possibility as a cause of her pulmonary hypertension could be chronic thromboembolic pulmonary hypertension given the fact that she has had joint replacements and it least one documented pulmonary embolism in the past.   We discussed this situation at length and I advised her that she should not take the tadalafil. If she wants treatment for her pulmonary hypertension (which may or may not be contributing to her shortness of breath) the first step would be to have a formal overnight sleep study.  I will continue to obtain records from Dr. Gust Brooms office to look for the remainder of the workup for both her shortness of breath and for secondary causes of pulmonary hypertension.  We will see her back in 2 weeks and discuss her findings.  In the meantime she is not to take the tadalafil and I advised her to exercise and try to lose weight as best as possible. At this point she prefers to forego a polysomnogram.   OSA (obstructive sleep apnea) I think it is highly likely that Kimberly Roy has obstructive sleep apnea based on her snoring and daytime somnolence. She says that because of her multiple chronic pain issues she is unable to sleep in a supine position and so therefore she has never undergone a formal sleep study. To the best of my nausea only test she has had has been an overnight oximetry test which has proven that she has nocturnal hypoxemia. I recommended that she have an overnight polysomnogram if she's not interested.    Updated Medication List Outpatient Encounter Prescriptions as of 07/07/2012  Medication Sig Dispense Refill  . alendronate (FOSAMAX) 70 MG tablet Take 70 mg by mouth every 7 (seven) days.       Marland Kitchen amitriptyline (ELAVIL) 25 MG tablet Take 25 mg by mouth at bedtime.      Marland Kitchen  amLODipine (NORVASC) 10 MG tablet Take 5 mg by mouth daily.      Marland Kitchen  atorvastatin (LIPITOR) 20 MG tablet TAKE ONE TABLET AT BEDTIME  90 tablet  0  . benazepril (LOTENSIN) 40 MG tablet Take 1 tablet (40 mg total) by mouth daily.  90 tablet  1  . diclofenac sodium (VOLTAREN) 1 % GEL Apply topically.      . furosemide (LASIX) 20 MG tablet Take 20 mg by mouth daily as needed.       . hydrochlorothiazide (HYDRODIURIL) 25 MG tablet Take 1 tablet (25 mg total) by mouth daily.  90 tablet  1  . HYDROcodone-acetaminophen (NORCO) 10-325 MG per tablet Take 1 tablet by mouth every 4 (four) hours as needed.      Marland Kitchen levothyroxine (SYNTHROID, LEVOTHROID) 100 MCG tablet Take 1 tablet (100 mcg total) by mouth daily.  90 tablet  1  . metoprolol succinate (TOPROL-XL) 100 MG 24 hr tablet Take 100 mg by mouth 2 (two) times daily. Take with or immediately following a meal.      . mometasone (NASONEX) 50 MCG/ACT nasal spray Place 2 sprays into the nose daily.      Marland Kitchen morphine (MS CONTIN) 100 MG 12 hr tablet Take 100 mg by mouth 3 (three) times daily.      . Multiple Vitamins-Minerals (CENTRUM SILVER ADULT 50+ PO) Take by mouth daily.      . pantoprazole (PROTONIX) 40 MG injection Take 40 mg by mouth 2 (two) times daily.       . polyethylene glycol powder (MIRALAX) powder Take 17 g by mouth daily.      . sodium chloride (OCEAN) 0.65 % nasal spray Place 1 spray into the nose as needed.      . [DISCONTINUED] amLODipine (NORVASC) 10 MG tablet Take 1 tablet (10 mg total) by mouth daily.  90 tablet  1  . ADCIRCA 20 MG TABS Take 2 tablets by mouth daily.      . [DISCONTINUED] atorvastatin (LIPITOR) 20 MG tablet Take 1 tablet (20 mg total) by mouth daily.  90 tablet  1  . [DISCONTINUED] oxyCODONE (OXY IR/ROXICODONE) 5 MG immediate release tablet

## 2012-07-07 NOTE — Patient Instructions (Signed)
Do not take Adcirca (Tadalafil) Try to exercise regularly and lose weight Keep using oxygen at night  We will see you back in two weeks to discuss the results of the tests that have been performed (when I get the records)

## 2012-07-08 ENCOUNTER — Telehealth: Payer: Self-pay | Admitting: *Deleted

## 2012-07-09 DIAGNOSIS — G4733 Obstructive sleep apnea (adult) (pediatric): Secondary | ICD-10-CM | POA: Insufficient documentation

## 2012-07-09 NOTE — Telephone Encounter (Signed)
I was looking at medication for this patient, it looks like it has already been order. I wanted to make sure before I order .

## 2012-07-09 NOTE — Telephone Encounter (Signed)
i think this has already been done

## 2012-07-09 NOTE — Assessment & Plan Note (Addendum)
I think it is highly likely that Kimberly Roy has obstructive sleep apnea based on her snoring and daytime somnolence. She says that because of her multiple chronic pain issues she is unable to sleep in a supine position and so therefore she has never undergone a formal sleep study. To the best of my nausea only test she has had has been an overnight oximetry test which has proven that she has nocturnal hypoxemia. I recommended that she have an overnight polysomnogram if she's not interested.

## 2012-07-10 ENCOUNTER — Telehealth: Payer: Self-pay | Admitting: *Deleted

## 2012-07-14 ENCOUNTER — Other Ambulatory Visit: Payer: Self-pay | Admitting: Internal Medicine

## 2012-07-14 MED ORDER — POLYETHYLENE GLYCOL 3350 17 GM/SCOOP PO POWD: 17.0000 g | Freq: Every day | ORAL | Status: DC | PRN

## 2012-07-14 MED ORDER — NYSTATIN 100000 UNIT/GM EX CREA: TOPICAL_CREAM | Freq: Two times a day (BID) | CUTANEOUS | Status: DC

## 2012-07-15 ENCOUNTER — Ambulatory Visit: Payer: Self-pay | Admitting: Specialist

## 2012-07-15 NOTE — Telephone Encounter (Signed)
Notified patient of lab results 

## 2012-07-15 NOTE — Telephone Encounter (Signed)
Notified patient of lab results

## 2012-07-17 ENCOUNTER — Telehealth: Payer: Self-pay | Admitting: *Deleted

## 2012-07-17 NOTE — Telephone Encounter (Signed)
Called and gave lab results to patient. Patient notified.

## 2012-07-20 ENCOUNTER — Other Ambulatory Visit: Payer: Self-pay | Admitting: *Deleted

## 2012-07-20 MED ORDER — LEVOTHYROXINE SODIUM 112 MCG PO TABS: 112.0000 ug | ORAL_TABLET | Freq: Every day | ORAL | Status: DC

## 2012-07-20 NOTE — Telephone Encounter (Signed)
Sent in to pharmacy.

## 2012-07-20 NOTE — Telephone Encounter (Signed)
Sent in to pharmacy. Patient notified.

## 2012-07-21 NOTE — Telephone Encounter (Signed)
Pt called wanting dr Nicki Reaper nurse to call concerning a thyroid test pt already had done

## 2012-07-22 NOTE — Telephone Encounter (Signed)
This pt has a history of hypothyroidism and this diagnosis should cover for a tsh to be checked.  Can you help with this.  Thanks.

## 2012-07-22 NOTE — Telephone Encounter (Signed)
Patient said that her TSH was denied by [facility]. Wants to know if the test performed was different than usual. Patients husband is going to drop off the letter this afternoon for you to look at.

## 2012-07-27 ENCOUNTER — Telehealth: Payer: Self-pay | Admitting: *Deleted

## 2012-07-27 ENCOUNTER — Ambulatory Visit: Payer: Self-pay | Admitting: Internal Medicine

## 2012-07-27 ENCOUNTER — Other Ambulatory Visit (INDEPENDENT_AMBULATORY_CARE_PROVIDER_SITE_OTHER): Payer: Medicare Other | Admitting: *Deleted

## 2012-07-27 ENCOUNTER — Telehealth: Payer: Self-pay | Admitting: Pulmonary Disease

## 2012-07-27 DIAGNOSIS — N390 Urinary tract infection, site not specified: Secondary | ICD-10-CM

## 2012-07-27 LAB — POCT URINALYSIS DIPSTICK
Bilirubin, UA: NEGATIVE
Blood, UA: NEGATIVE
Glucose, UA: NEGATIVE
Nitrite, UA: NEGATIVE
Spec Grav, UA: 1.02
pH, UA: 7

## 2012-07-27 MED ORDER — CEFUROXIME AXETIL 250 MG PO TABS: 250.0000 mg | ORAL_TABLET | Freq: Two times a day (BID) | ORAL | Status: DC

## 2012-07-27 NOTE — Telephone Encounter (Signed)
Patient sent letter to Dr. Nicki Reaper concerning Dr. Lake Bells. Dr. Nicki Reaper advised me to let patient know she would have to call Dr. Anastasia Pall office for information. Patient notified.

## 2012-07-27 NOTE — Telephone Encounter (Signed)
pts urine shows initial changes of a uti.  Will send for culture.  Confirm pt is not allergic to pcn or related med.  If not the can call in ceftin 248m bid x 1 week.  Will get cx back and confirm is sensitive.

## 2012-07-27 NOTE — Telephone Encounter (Signed)
Pt appt is 08/04/12 w/ BQ. Pt records is still not received by Dr. Leane Platt office yet. Pt stated if records are not received by her appt date then to call and cancel her appt as this is the reason for her appt. Will forward to leslie to f/u on.

## 2012-07-27 NOTE — Telephone Encounter (Signed)
Called patient to let her know. Patient stated that she is not allergic to pcn or related drug. Medication phoned in med to pharmacy.

## 2012-07-29 ENCOUNTER — Telehealth: Payer: Self-pay | Admitting: Internal Medicine

## 2012-07-29 LAB — URINE CULTURE

## 2012-07-29 NOTE — Telephone Encounter (Signed)
Please advise leslie if you received records thanks

## 2012-07-29 NOTE — Telephone Encounter (Signed)
Called and gave lab results to patient.

## 2012-07-29 NOTE — Telephone Encounter (Signed)
Still have not received, and so I refaxed the request to Dr. Gust Brooms office 07/28/12 Will await records

## 2012-07-29 NOTE — Telephone Encounter (Signed)
Refill request for nasonex 50 mcg/inh nose aer gm #17 Sig: two sprays in each nostril once a day Patient has been seen

## 2012-07-29 NOTE — Telephone Encounter (Signed)
Notify pt mammo - ok.  There is also a lab message on this pt - so can notify both at same time.  Thanks.

## 2012-07-29 NOTE — Progress Notes (Signed)
Called patient with results.

## 2012-07-31 MED ORDER — MOMETASONE FUROATE 50 MCG/ACT NA SUSP: 2.0000 | Freq: Every day | NASAL | Status: DC

## 2012-07-31 NOTE — Telephone Encounter (Signed)
Opened by mistake.

## 2012-07-31 NOTE — Telephone Encounter (Signed)
Sent in to pharmacy.

## 2012-08-03 NOTE — Telephone Encounter (Signed)
Records received and given to Dr. Lake Bells to review [facility] for pt that she needs to reschedule her appt.

## 2012-08-03 NOTE — Telephone Encounter (Signed)
Records requested again this am Will call the pt back to reschedule her f/u once received

## 2012-08-04 ENCOUNTER — Ambulatory Visit: Payer: Medicare Other | Admitting: Pulmonary Disease

## 2012-08-11 ENCOUNTER — Encounter: Payer: Self-pay | Admitting: Pulmonary Disease

## 2012-08-18 ENCOUNTER — Encounter: Payer: Self-pay | Admitting: *Deleted

## 2012-08-19 ENCOUNTER — Ambulatory Visit: Payer: Medicare Other | Admitting: Internal Medicine

## 2012-08-19 ENCOUNTER — Ambulatory Visit (INDEPENDENT_AMBULATORY_CARE_PROVIDER_SITE_OTHER): Payer: Medicare Other | Admitting: Internal Medicine

## 2012-08-19 ENCOUNTER — Encounter: Payer: Self-pay | Admitting: Internal Medicine

## 2012-08-19 VITALS — BP 140/68 | HR 64 | Temp 98.3°F | Ht 64.0 in | Wt 220.0 lb

## 2012-08-19 DIAGNOSIS — D649 Anemia, unspecified: Secondary | ICD-10-CM

## 2012-08-19 DIAGNOSIS — I272 Pulmonary hypertension, unspecified: Secondary | ICD-10-CM

## 2012-08-19 DIAGNOSIS — M1990 Unspecified osteoarthritis, unspecified site: Secondary | ICD-10-CM

## 2012-08-19 DIAGNOSIS — E7800 Pure hypercholesterolemia, unspecified: Secondary | ICD-10-CM

## 2012-08-19 DIAGNOSIS — I10 Essential (primary) hypertension: Secondary | ICD-10-CM

## 2012-08-19 DIAGNOSIS — G629 Polyneuropathy, unspecified: Secondary | ICD-10-CM

## 2012-08-19 DIAGNOSIS — G4733 Obstructive sleep apnea (adult) (pediatric): Secondary | ICD-10-CM

## 2012-08-19 DIAGNOSIS — E039 Hypothyroidism, unspecified: Secondary | ICD-10-CM

## 2012-08-19 DIAGNOSIS — I2789 Other specified pulmonary heart diseases: Secondary | ICD-10-CM

## 2012-08-19 DIAGNOSIS — G589 Mononeuropathy, unspecified: Secondary | ICD-10-CM

## 2012-08-19 DIAGNOSIS — R918 Other nonspecific abnormal finding of lung field: Secondary | ICD-10-CM

## 2012-08-19 LAB — BASIC METABOLIC PANEL
CO2: 29 mEq/L (ref 19–32)
Calcium: 9.5 mg/dL (ref 8.4–10.5)
Chloride: 98 mEq/L (ref 96–112)
Glucose, Bld: 116 mg/dL — ABNORMAL HIGH (ref 70–99)
Potassium: 4 mEq/L (ref 3.5–5.1)
Sodium: 138 mEq/L (ref 135–145)

## 2012-08-19 LAB — TSH: TSH: 0.97 u[IU]/mL (ref 0.35–5.50)

## 2012-08-19 LAB — VITAMIN B12: Vitamin B-12: >1500 pg/mL — ABNORMAL HIGH (ref 211–911)

## 2012-08-20 ENCOUNTER — Other Ambulatory Visit: Payer: Self-pay | Admitting: Internal Medicine

## 2012-08-20 DIAGNOSIS — E039 Hypothyroidism, unspecified: Secondary | ICD-10-CM

## 2012-08-20 DIAGNOSIS — D649 Anemia, unspecified: Secondary | ICD-10-CM

## 2012-08-20 DIAGNOSIS — E7800 Pure hypercholesterolemia, unspecified: Secondary | ICD-10-CM

## 2012-08-20 DIAGNOSIS — I10 Essential (primary) hypertension: Secondary | ICD-10-CM

## 2012-08-21 ENCOUNTER — Telehealth: Payer: Self-pay | Admitting: Internal Medicine

## 2012-08-21 ENCOUNTER — Encounter: Payer: Self-pay | Admitting: *Deleted

## 2012-08-21 NOTE — Telephone Encounter (Signed)
Patient given results and scheduled fasting blood work on 11-16-12

## 2012-08-21 NOTE — Telephone Encounter (Signed)
Patient calling for her lab results and wants an appointment for her son.

## 2012-08-27 ENCOUNTER — Encounter: Payer: Self-pay | Admitting: Internal Medicine

## 2012-08-28 ENCOUNTER — Encounter: Payer: Self-pay | Admitting: Internal Medicine

## 2012-08-28 NOTE — Assessment & Plan Note (Signed)
On synthroid.  Just adjusted dose.  Follow tsh.

## 2012-08-28 NOTE — Progress Notes (Signed)
  Subjective:    Patient ID: Kimberly Roy, female    DOB: 12-29-1930, 78 y.o.   MRN: [id number]  HPI 78 year old female with past history of hypertension, hypercholesterolemia, hypothyroidism, anemia, chronic constipation and pulmonary hypertension (new diagnosis).  She comes in today for scheduled follow up.  Accompanied by her husband.  History obtained from both of them.  Breathing is stable.  No chest pain or tightness.  No nausea or vomiting.  Swelling improved.  Wearing her compression stockings.  She is having some burning in her feet.  Some pain in her lower back.  Is scheduled to see pain clinic tomorrow.  Eating and drinking well.     Past Medical History  Diagnosis Date  . Osteoarthritis     knees/cervical and lumbar spine  . Hypertension   . Hyperlipidemia   . Hypothyroidism   . Anemia   . Depression   . Anxiety   . Nephrolithiasis   . Interstitial cystitis   . Pulmonary nodules     followed by Dr Raul Del  . Fatty liver   . Renal cyst     right  . Pulmonary hypertension     Review of Systems Patient denies any headache, lightheadedness or dizziness.  No chest pain, tightness or palpitations. Breathing is stable.  No significant increased cough.  Some allergy symptoms - controlled with Nasonex. Some hoarseness and dry nose reported.   No nausea or vomiting.  No abdominal pain or cramping currently.  No BRBPR or melana.  Persistent problems with constipation.   No urine change. No vaginal problems.        Objective:   Physical Exam  Filed Vitals:   08/19/12 1030  BP: 140/68  Pulse: 64  Temp: 98.3 F (36.8 C)   Blood pressure recheck:  54/91  78 year old female in no acute distress.   HEENT:  Nares - clear.  OP- without lesions or erythema.  NECK:  Supple, nontender.  No audible bruit.   HEART:  Appears to be regular. LUNGS:  Without crackles or wheezing audible.  Respirations even and unlabored.   RADIAL PULSE:  Equal bilaterally.  ABDOMEN:  Soft, nontender.   No audible abdominal bruit.   EXTREMITIES:  No increased edema to be present.  Improved.  Support hose in place.      Assessment & Plan:  HEALTH MAINTENANCE.  Will review records from outside.  Keep on track with her physicals.  Colonoscopy as outlined.    CARDIOVASCULAR.  Sees Dr Saralyn Pilar.  Currently symptoms stable.  Continue risk factor modification.    QUESTIONABLE NEUROPATHY.  Check B12.  She wants to discuss further with ortho.    HEALTH MAINTENANCE.  Physical 11/19/11.  Being followed by GI.  Pap 04/05/09 within normal limits.  Mammogram 07/27/12 -  Birads II.

## 2012-08-28 NOTE — Assessment & Plan Note (Signed)
Just saw Dr Lake Bells.  See his note for details.  I again discussed with her regarding a follow up sleep study and CPAP titration.  She declines.  Follow.

## 2012-08-28 NOTE — Assessment & Plan Note (Signed)
Nodules have been stable.  No further follow up CT warranted.    

## 2012-08-28 NOTE — Assessment & Plan Note (Signed)
On Lipitor.  Low cholesterol diet and exercise.  Follow lipid proflie and liver panel   

## 2012-08-28 NOTE — Assessment & Plan Note (Signed)
Blood pressure under reasonable control on current medication regimen.  Follow metabolic panel.

## 2012-08-28 NOTE — Assessment & Plan Note (Addendum)
Discussed the need for sleep study and titration study.  She declines.  Follow.

## 2012-08-28 NOTE — Assessment & Plan Note (Signed)
Follow cbc and ferritin.

## 2012-08-28 NOTE — Assessment & Plan Note (Signed)
Continue follow up with ortho and pain clinic.  Follow.     

## 2012-08-31 ENCOUNTER — Ambulatory Visit: Payer: Self-pay | Admitting: Orthopedic Surgery

## 2012-10-05 ENCOUNTER — Telehealth: Payer: Self-pay | Admitting: Internal Medicine

## 2012-10-05 ENCOUNTER — Ambulatory Visit: Payer: Medicare Other | Admitting: Internal Medicine

## 2012-10-05 NOTE — Telephone Encounter (Signed)
Refill on Norvasc 10 mg . Pharmacy filled Norvasc 5 mg ,but the patient has been taking 10 mg for 1 month.  Refill on Lipitor 20 mg  #90

## 2012-10-07 MED ORDER — AMLODIPINE BESYLATE 10 MG PO TABS: 10.0000 mg | ORAL_TABLET | Freq: Every day | ORAL | Status: DC

## 2012-10-07 MED ORDER — ATORVASTATIN CALCIUM 20 MG PO TABS: ORAL_TABLET | ORAL | Status: DC

## 2012-10-07 NOTE — Telephone Encounter (Signed)
Sent in to pharmacy.

## 2012-10-08 ENCOUNTER — Other Ambulatory Visit: Payer: Self-pay | Admitting: Internal Medicine

## 2012-11-10 ENCOUNTER — Other Ambulatory Visit: Payer: Self-pay | Admitting: *Deleted

## 2012-11-13 ENCOUNTER — Telehealth: Payer: Self-pay | Admitting: *Deleted

## 2012-11-13 NOTE — Telephone Encounter (Signed)
Called patient about info she left with you. Patient stated that she would discuss this with you when comes in on 11/30/12.

## 2012-11-16 ENCOUNTER — Other Ambulatory Visit: Payer: Medicare Other

## 2012-11-17 ENCOUNTER — Other Ambulatory Visit: Payer: Self-pay | Admitting: *Deleted

## 2012-11-18 ENCOUNTER — Encounter: Payer: Self-pay | Admitting: Internal Medicine

## 2012-11-18 ENCOUNTER — Telehealth: Payer: Self-pay | Admitting: Internal Medicine

## 2012-11-18 ENCOUNTER — Ambulatory Visit (INDEPENDENT_AMBULATORY_CARE_PROVIDER_SITE_OTHER): Payer: Medicare Other | Admitting: Internal Medicine

## 2012-11-18 VITALS — BP 130/60 | HR 66 | Temp 98.7°F | Ht 64.0 in | Wt 229.2 lb

## 2012-11-18 DIAGNOSIS — I10 Essential (primary) hypertension: Secondary | ICD-10-CM

## 2012-11-18 DIAGNOSIS — N390 Urinary tract infection, site not specified: Secondary | ICD-10-CM

## 2012-11-18 LAB — POCT URINALYSIS DIPSTICK
Ketones, UA: NEGATIVE
Nitrite, UA: NEGATIVE
Protein, UA: NEGATIVE
Urobilinogen, UA: 0.2
pH, UA: 5

## 2012-11-18 MED ORDER — NITROFURANTOIN MONOHYD MACRO 100 MG PO CAPS: 100.0000 mg | ORAL_CAPSULE | Freq: Two times a day (BID) | ORAL | Status: DC

## 2012-11-18 NOTE — Telephone Encounter (Signed)
Pt states she thinks she has a bladder infection.  She has been taking OTC meds that turn her urine orange and are supposed to help but she is still having severe pain and burning and frequency.  Pt asking if her husband can bring in her urine sample today??  Please advise.  Pt would like a phone call.

## 2012-11-18 NOTE — Assessment & Plan Note (Signed)
Blood pressure under reasonable control on current medication regimen.  Follow.

## 2012-11-18 NOTE — Telephone Encounter (Signed)
See if she can come on in now.  Unable to call in abx for uti.  Can work her in during lunch - work in for this problem.

## 2012-11-18 NOTE — Telephone Encounter (Signed)
Patient will come for appointment at lunch.

## 2012-11-18 NOTE — Progress Notes (Signed)
Subjective:    Patient ID: Kimberly Roy, female    DOB: 1931-01-04, 79 y.o.   MRN: [id number]  Urinary Tract Infection   79 year old female with past history of hypertension, hypercholesterolemia, hypothyroidism, anemia, chronic constipation and pulmonary hypertension (new diagnosis).  She comes in today as a work in with concerns regarding a possible urinary tract infection.  States symptoms started a few days ago.  Increased dysuria and increased lower suprapubic pressure.  No other abdominal pain.  No change - back pain.  Increased frequency.  No documented fever.  Will feel hot at times.  Eating and drinking well.     Past Medical History  Diagnosis Date  . Osteoarthritis     knees/cervical and lumbar spine  . Hypertension   . Hyperlipidemia   . Hypothyroidism   . Anemia   . Depression   . Anxiety   . Nephrolithiasis   . Interstitial cystitis   . Pulmonary nodules     followed by Dr Raul Del  . Fatty liver   . Renal cyst     right  . Pulmonary hypertension     Current Outpatient Prescriptions on File Prior to Visit  Medication Sig Dispense Refill  . ADCIRCA 20 MG TABS Take 2 tablets by mouth daily.      Marland Kitchen alendronate (FOSAMAX) 70 MG tablet Take 70 mg by mouth every 7 (seven) days.       Marland Kitchen amitriptyline (ELAVIL) 25 MG tablet Take 25 mg by mouth at bedtime.      Marland Kitchen amLODipine (NORVASC) 10 MG tablet Take 1 tablet (10 mg total) by mouth daily.  90 tablet  1  . atorvastatin (LIPITOR) 20 MG tablet Take one tablet by mouth daily at bedtime  90 tablet  1  . benazepril (LOTENSIN) 40 MG tablet Take 1 tablet (40 mg total) by mouth daily.  90 tablet  1  . cefUROXime (CEFTIN) 250 MG tablet Take 1 tablet (250 mg total) by mouth 2 (two) times daily.  14 tablet  0  . diclofenac sodium (VOLTAREN) 1 % GEL Apply topically.      . furosemide (LASIX) 20 MG tablet Take 20 mg by mouth daily as needed.       . hydrochlorothiazide (HYDRODIURIL) 25 MG tablet Take 1 tablet (25 mg total) by mouth daily.   90 tablet  1  . HYDROcodone-acetaminophen (NORCO) 10-325 MG per tablet Take 1 tablet by mouth every 4 (four) hours as needed.      . metoprolol succinate (TOPROL-XL) 100 MG 24 hr tablet Take 100 mg by mouth 2 (two) times daily. Take with or immediately following a meal.      . mometasone (NASONEX) 50 MCG/ACT nasal spray Place 2 sprays into the nose daily.  17 g  3  . morphine (MS CONTIN) 100 MG 12 hr tablet Take 100 mg by mouth 3 (three) times daily.      . Multiple Vitamins-Minerals (CENTRUM SILVER ADULT 50+ PO) Take by mouth daily.      Marland Kitchen nystatin cream (MYCOSTATIN) Apply topically 2 (two) times daily.  30 g  1  . pantoprazole (PROTONIX) 40 MG injection Take 40 mg by mouth 2 (two) times daily.       . polyethylene glycol powder (GLYCOLAX/MIRALAX) powder Take 17 g by mouth daily as needed.  850 g  1  . polyethylene glycol powder (MIRALAX) powder Take 17 g by mouth daily.      . sodium chloride (OCEAN) 0.65 %  nasal spray Place 1 spray into the nose as needed.      Marland Kitchen SYNTHROID 112 MCG tablet TAKE ONE (1) TABLET EACH DAY  90 each  0   No current facility-administered medications on file prior to visit.    Review of Systems reports the increased urinary frequency and dysuria as outlined.  Lower suprapubic pressure.  No increased back pain.   No nausea or vomiting.  No abdominal pain or cramping currently.  No BRBPR or melana.  Persistent problems with constipation. No vaginal problems.   Eating and drinking well.      Objective:   Physical Exam  Filed Vitals:   11/18/12 1225  BP: 130/60  Pulse: 66  Temp: 98.7 F (50.49 C)   79 year old female in no acute distress.   HEART:  Appears to be regular. LUNGS:  Without crackles or wheezing audible.  Respirations even and unlabored, ABDOMEN:  Soft, nontender.  No audible abdominal bruit.   BACK:  No CVA tenderness.      Assessment & Plan:  PROBABLE UTI.  Symptoms as outlined.  Urine dip with trace leukocytes and trace blood.  Will treat  with macrobid.  She has taken this and tolerated in the past.  Send urine for culture.  Drink fluids.     CARDIOVASCULAR.  Sees Dr Saralyn Pilar.  Currently symptoms stable.  Continue risk factor modification.    HEALTH MAINTENANCE.  Physical 11/19/11.  Being followed by GI.  Pap 04/05/09 within normal limits.  Mammogram 07/27/12 -  Birads II.

## 2012-11-20 ENCOUNTER — Ambulatory Visit: Payer: Medicare Other | Admitting: Internal Medicine

## 2012-11-20 ENCOUNTER — Telehealth: Payer: Self-pay | Admitting: *Deleted

## 2012-11-20 MED ORDER — AMITRIPTYLINE HCL 25 MG PO TABS: 25.0000 mg | ORAL_TABLET | Freq: Every day | ORAL | Status: DC

## 2012-11-20 NOTE — Telephone Encounter (Signed)
Pharmacy requested refill

## 2012-11-21 LAB — URINE CULTURE

## 2012-11-24 ENCOUNTER — Telehealth: Payer: Self-pay | Admitting: Internal Medicine

## 2012-11-24 MED ORDER — CEFUROXIME AXETIL 250 MG PO TABS: 250.0000 mg | ORAL_TABLET | Freq: Two times a day (BID) | ORAL | Status: AC

## 2012-11-24 NOTE — Telephone Encounter (Signed)
Patient stated that she is not allergic to penicillin or (meds like keflex). Sent in rx [facility].

## 2012-11-24 NOTE — Telephone Encounter (Signed)
Patient thinks her infection has not completely gone away . She is still having burning from her UTI.

## 2012-11-24 NOTE — Telephone Encounter (Signed)
If persistent symptoms, confirm with pt not allergic to penicillin or (meds like keflex).  If not allergic, then call in ceftin 256m bid x 1 week.  If allergic - let me know

## 2012-11-26 ENCOUNTER — Other Ambulatory Visit (INDEPENDENT_AMBULATORY_CARE_PROVIDER_SITE_OTHER): Payer: Medicare Other

## 2012-11-26 DIAGNOSIS — D649 Anemia, unspecified: Secondary | ICD-10-CM

## 2012-11-26 DIAGNOSIS — I10 Essential (primary) hypertension: Secondary | ICD-10-CM

## 2012-11-26 DIAGNOSIS — E7800 Pure hypercholesterolemia, unspecified: Secondary | ICD-10-CM

## 2012-11-26 DIAGNOSIS — E039 Hypothyroidism, unspecified: Secondary | ICD-10-CM

## 2012-11-26 LAB — BASIC METABOLIC PANEL
BUN: 15 mg/dL (ref 6–23)
CO2: 31 mEq/L (ref 19–32)
Calcium: 8.9 mg/dL (ref 8.4–10.5)
Creatinine, Ser: 0.7 mg/dL (ref 0.4–1.2)
Glucose, Bld: 100 mg/dL — ABNORMAL HIGH (ref 70–99)

## 2012-11-26 LAB — TSH: TSH: 2.37 u[IU]/mL (ref 0.35–5.50)

## 2012-11-26 LAB — HEPATIC FUNCTION PANEL
AST: 20 U/L (ref 0–37)
Albumin: 3.9 g/dL (ref 3.5–5.2)
Alkaline Phosphatase: 91 U/L (ref 39–117)
Bilirubin, Direct: 0.1 mg/dL (ref 0.0–0.3)
Total Bilirubin: 0.4 mg/dL (ref 0.3–1.2)

## 2012-11-26 LAB — FERRITIN: Ferritin: 92.9 ng/mL (ref 10.0–291.0)

## 2012-11-26 LAB — CBC WITH DIFFERENTIAL/PLATELET
Eosinophils Relative: 3.2 % (ref 0.0–5.0)
HCT: 32.7 % — ABNORMAL LOW (ref 36.0–46.0)
Lymphocytes Relative: 34.5 % (ref 12.0–46.0)
Lymphs Abs: 2.8 10*3/uL (ref 0.7–4.0)
Monocytes Relative: 7.6 % (ref 3.0–12.0)
Platelets: 213 10*3/uL (ref 150.0–400.0)
WBC: 8.1 10*3/uL (ref 4.5–10.5)

## 2012-11-26 LAB — LIPID PANEL: Total CHOL/HDL Ratio: 4

## 2012-11-30 ENCOUNTER — Encounter: Payer: Self-pay | Admitting: Internal Medicine

## 2012-11-30 ENCOUNTER — Ambulatory Visit (INDEPENDENT_AMBULATORY_CARE_PROVIDER_SITE_OTHER): Payer: Medicare Other | Admitting: Internal Medicine

## 2012-11-30 VITALS — BP 142/70 | HR 72 | Temp 98.6°F | Ht 64.0 in | Wt 232.0 lb

## 2012-11-30 DIAGNOSIS — I272 Pulmonary hypertension, unspecified: Secondary | ICD-10-CM

## 2012-11-30 DIAGNOSIS — R05 Cough: Secondary | ICD-10-CM

## 2012-11-30 DIAGNOSIS — R059 Cough, unspecified: Secondary | ICD-10-CM

## 2012-11-30 DIAGNOSIS — E039 Hypothyroidism, unspecified: Secondary | ICD-10-CM

## 2012-11-30 DIAGNOSIS — K5900 Constipation, unspecified: Secondary | ICD-10-CM

## 2012-11-30 DIAGNOSIS — G4733 Obstructive sleep apnea (adult) (pediatric): Secondary | ICD-10-CM

## 2012-11-30 DIAGNOSIS — I10 Essential (primary) hypertension: Secondary | ICD-10-CM

## 2012-11-30 DIAGNOSIS — E7800 Pure hypercholesterolemia, unspecified: Secondary | ICD-10-CM

## 2012-11-30 DIAGNOSIS — K5909 Other constipation: Secondary | ICD-10-CM

## 2012-11-30 DIAGNOSIS — D649 Anemia, unspecified: Secondary | ICD-10-CM

## 2012-11-30 DIAGNOSIS — I2789 Other specified pulmonary heart diseases: Secondary | ICD-10-CM

## 2012-11-30 DIAGNOSIS — M1990 Unspecified osteoarthritis, unspecified site: Secondary | ICD-10-CM

## 2012-12-15 ENCOUNTER — Ambulatory Visit: Payer: Self-pay | Admitting: General Practice

## 2012-12-21 ENCOUNTER — Telehealth: Payer: Self-pay | Admitting: Pulmonary Disease

## 2012-12-21 NOTE — Telephone Encounter (Signed)
Pt decided not to leave a message.  Satira Anis

## 2012-12-29 ENCOUNTER — Encounter: Payer: Self-pay | Admitting: Pulmonary Disease

## 2012-12-29 ENCOUNTER — Ambulatory Visit (INDEPENDENT_AMBULATORY_CARE_PROVIDER_SITE_OTHER): Payer: Medicare Other | Admitting: Pulmonary Disease

## 2012-12-29 VITALS — BP 164/74 | HR 80 | Temp 98.0°F | Ht 60.0 in | Wt 235.0 lb

## 2012-12-29 DIAGNOSIS — I272 Pulmonary hypertension, unspecified: Secondary | ICD-10-CM

## 2012-12-29 DIAGNOSIS — I2789 Other specified pulmonary heart diseases: Secondary | ICD-10-CM

## 2012-12-29 DIAGNOSIS — G4733 Obstructive sleep apnea (adult) (pediatric): Secondary | ICD-10-CM

## 2012-12-29 DIAGNOSIS — R0609 Other forms of dyspnea: Secondary | ICD-10-CM

## 2012-12-29 DIAGNOSIS — R0600 Dyspnea, unspecified: Secondary | ICD-10-CM

## 2012-12-29 DIAGNOSIS — I509 Heart failure, unspecified: Secondary | ICD-10-CM

## 2012-12-29 DIAGNOSIS — R05 Cough: Secondary | ICD-10-CM

## 2012-12-29 DIAGNOSIS — R079 Chest pain, unspecified: Secondary | ICD-10-CM | POA: Insufficient documentation

## 2012-12-29 NOTE — Assessment & Plan Note (Signed)
Most likely related to sinus congestion from allergic rhinitis.  Plan: -continue Nasonex - add Zyrtec -Add twice a day saline rinses -Add as needed decongestant (phenylephrine) -If no improvement then continue stopping benazepril next visit

## 2012-12-29 NOTE — Progress Notes (Signed)
Subjective:    Patient ID: Kimberly Roy, female    DOB: Jan 22, 1931, 79 y.o.   MRN: [id number]  Synopsis: Kimberly Roy first saw the [facility] pulmonary clinic in November of 2013 for evaluation of pulmonary hypertension. In November 2012 she had a left heart cath and right heart cath. Her left heart catheterization showed no obstructive coronary disease the right heart cath showed a mean PA pressure of 35. This is associated with a cardiac index of 2.99 as well as a wedge pressure of 21. An echocardiogram at that time showed an LVEF of 40-45% and right ventricular dilation. She had used Adcirca but this did not improve her symptoms. She noted symptoms consistent with obstructive sleep apnea and never had a polysomnogram.  HPI  12/29/2012 ROV- Since our last visit [name]'s symptoms have not worsened significantly up until about 2 months ago. At that point she developed sinus congestion and saw her ear nose and throat physician who gave her an antibiotic. She said initially this helped but she has had persistent symptoms since. She describes sinus congestion postnasal drip and runny nose which is nearly continuous. This is associated with hoarseness and cough. She has had some chest tightness as well. She uses Nasonex but nothing else for her sinuses other than saline rinses at night. She has not been back to her ear, nose and throat physician. She has experienced some shortness of breath in the last several weeks as well. Her leg swelling has been worse lately but her weight has been stable. She attributes the leg swelling problem to not wearing her compression stockings. She has noticed some chest tightness in the last several weeks as well that is constant, substernal, and nonradiating.   Past Medical History  Diagnosis Date  . Osteoarthritis     knees/cervical and lumbar spine  . Hypertension   . Hyperlipidemia   . Hypothyroidism   . Anemia   . Depression   . Anxiety   . Nephrolithiasis    . Interstitial cystitis   . Pulmonary nodules     followed by Dr Raul Del  . Fatty liver   . Renal cyst     right  . Pulmonary hypertension     Review of Systems  Constitutional: Positive for fatigue. Negative for fever and chills.  HENT: Positive for congestion, rhinorrhea, postnasal drip and sinus pressure.   Respiratory: Positive for cough and shortness of breath. Negative for wheezing.   Cardiovascular: Positive for chest pain and leg swelling. Negative for palpitations.       Objective:   Physical Exam  Filed Vitals:   12/29/12 1018  BP: 164/74  Pulse: 80  Temp: 98 F (36.7 C)  TempSrc: Oral  Height: 5' (1.524 m)  Weight: 235 lb (106.595 kg)  SpO2: 97%  RA  Gen: obese, chronically ill appearing, no acute distress HEENT: NCAT, EOMi, OP clear, some nasal mucosal erythema PULM: CTA B CV: RRR, systolic murmur RUSB, no JVD AB: BS+, soft, nontender, no hsm Ext: warm, pitting edema in legs, compression stockings in place, no clubbing, no cyanosis    03/2012 Full PFT >> Ratio 82%, FEV 1.45 L (82%), TLC 2.99L (72% pred) DLCO 53% pred 07/2011 LHC >> normal coronary anatomy 07/2011 RHC >> Wedge 21; PA mean 35, 2.99 woods units; CO 4.6 L/min, CI 2.3 L/min/m2 07/2011 TTE >> mild RV dilation, LVEF 40-45%  12/29/2012 EKG > poor study due to TENS unit, no clear ST wave changes  Assessment & Plan:   Pulmonary hypertension Due to CHF (07/2011 RHC wedge pressure 21, EF 40-45%) and possibly OSA, therefore no role for pulmonary vasodilator.  Plan: -home polysomonogram ordered -continue oxygen at night  CHF (congestive heart failure) She does not appear to be in exacerbation today, but she does not follow Na restriction or watch her weight closely.  Plan: -I instructed her to discuss this with her cardiologist -watch weight carefully -use lasix bid when weight up > 3lbs -keep Na intake < 2gm/day -continue current medication regimen  Cough Most likely related to  sinus congestion from allergic rhinitis.  Plan: -continue Nasonex - add Zyrtec -Add twice a day saline rinses -Add as needed decongestant (phenylephrine) -If no improvement then continue stopping benazepril next visit   OSA (obstructive sleep apnea) Plan home polysomnogram  Chest pain This a chronic problem and has not been rapidly accelerating lately. It is atypical in nature. Her EKG was difficult to interpret today due to her TENS unit. I advised her to seek care in an emergency department or call 911 if her chest pain rapidly accelerates. She will discuss this with her cardiologist.      Updated Medication List Outpatient Encounter Prescriptions as of 12/29/2012  Medication Sig Dispense Refill  . alendronate (FOSAMAX) 70 MG tablet Take 70 mg by mouth every 7 (seven) days.       Marland Kitchen amitriptyline (ELAVIL) 25 MG tablet Take 1 tablet (25 mg total) by mouth at bedtime.  30 tablet  5  . amLODipine (NORVASC) 10 MG tablet Take 1 tablet (10 mg total) by mouth daily.  90 tablet  1  . atorvastatin (LIPITOR) 20 MG tablet Take one tablet by mouth daily at bedtime  90 tablet  1  . benazepril (LOTENSIN) 40 MG tablet Take 1 tablet (40 mg total) by mouth daily.  90 tablet  1  . Calcium-Magnesium-Vitamin D 600-40-500 MG-MG-UNIT TB24 Take 630 mg by mouth 4 (four) times daily.      [name] Sodium 30-100 MG CAPS Take by mouth.      . diclofenac sodium (VOLTAREN) 1 % GEL Apply topically.      . furosemide (LASIX) 20 MG tablet Take 20 mg by mouth daily as needed.       . gabapentin (NEURONTIN) 300 MG capsule Take 300 mg by mouth 3 (three) times daily.      . hydrochlorothiazide (HYDRODIURIL) 25 MG tablet Take 1 tablet (25 mg total) by mouth daily.  90 tablet  1  . metoprolol succinate (TOPROL-XL) 100 MG 24 hr tablet Take 100 mg by mouth 2 (two) times daily. Take with or immediately following a meal.      . morphine (MS CONTIN) 100 MG 12 hr tablet Take 100 mg by mouth 3 (three) times daily.       . Multiple Vitamins-Minerals (CENTRUM SILVER ADULT 50+ PO) Take by mouth daily.      . nitrofurantoin, macrocrystal-monohydrate, (MACROBID) 100 MG capsule Take 1 capsule (100 mg total) by mouth 2 (two) times daily.  10 capsule  0  . nystatin cream (MYCOSTATIN) Apply topically 2 (two) times daily.  30 g  1  . pantoprazole (PROTONIX) 40 MG injection Take 40 mg by mouth 2 (two) times daily.       . polyethylene glycol powder (MIRALAX) powder Take 17 g by mouth daily.      Marland Kitchen SYNTHROID 112 MCG tablet TAKE ONE (1) TABLET EACH DAY  90 each  0  . ADCIRCA  20 MG TABS Take 2 tablets by mouth daily.      Marland Kitchen HYDROcodone-acetaminophen (NORCO) 10-325 MG per tablet Take 1 tablet by mouth every 4 (four) hours as needed.      . mometasone (NASONEX) 50 MCG/ACT nasal spray Place 2 sprays into the nose daily.  17 g  3  . sodium chloride (OCEAN) 0.65 % nasal spray Place 1 spray into the nose as needed.      . [DISCONTINUED] polyethylene glycol powder (GLYCOLAX/MIRALAX) powder Take 17 g by mouth daily as needed.  850 g  1   No facility-administered encounter medications on file as of 12/29/2012.

## 2012-12-29 NOTE — Assessment & Plan Note (Signed)
Plan home polysomnogram

## 2012-12-29 NOTE — Assessment & Plan Note (Signed)
This a chronic problem and has not been rapidly accelerating lately. It is atypical in nature. Her EKG was difficult to interpret today due to her TENS unit. I advised her to seek care in an emergency department or call 911 if her chest pain rapidly accelerates. She will discuss this with her cardiologist.

## 2012-12-29 NOTE — Assessment & Plan Note (Signed)
Due to CHF (07/2011 RHC wedge pressure 21, EF 40-45%) and possibly OSA, therefore no role for pulmonary vasodilator.  Plan: -home polysomonogram ordered -continue oxygen at night

## 2012-12-29 NOTE — Assessment & Plan Note (Signed)
She does not appear to be in exacerbation today, but she does not follow Na restriction or watch her weight closely.  Plan: -I instructed her to discuss this with her cardiologist -watch weight carefully -use lasix bid when weight up > 3lbs -keep Na intake < 2gm/day -continue current medication regimen

## 2012-12-29 NOTE — Patient Instructions (Addendum)
Chest pain: -If the chest pain gets worse then go to the ER or call 911.   -Talk to Dr. Saralyn Pilar about the chest pain  Sinus congestion: -start using an over the counter generic antihistamine such as cetirizine (Zyrtec), fexofenadine (Allegra) -use your saline rinses at least twice a day -use phenylephrine as needed for sinus congestion -go see your ENT physician again  Heart failure: -avoid more than 2gm of sodium daily -watch your weight and take an extra lasix in the afternoon if your weight is up by 3 lbs or more with leg swelling -follow up with Dr. Saralyn Pilar  Pulmonary hypertension -you need a sleep study to look for sleep apnea as this can cause pulmonary hypertension -we will set this up in your home  We will see you back in 3 months or sooner if needed

## 2013-01-04 ENCOUNTER — Other Ambulatory Visit: Payer: Self-pay | Admitting: Internal Medicine

## 2013-01-06 ENCOUNTER — Ambulatory Visit (INDEPENDENT_AMBULATORY_CARE_PROVIDER_SITE_OTHER): Payer: Medicare Other | Admitting: Pulmonary Disease

## 2013-01-06 ENCOUNTER — Other Ambulatory Visit: Payer: Self-pay | Admitting: *Deleted

## 2013-01-06 DIAGNOSIS — G4733 Obstructive sleep apnea (adult) (pediatric): Secondary | ICD-10-CM

## 2013-01-06 DIAGNOSIS — R0600 Dyspnea, unspecified: Secondary | ICD-10-CM

## 2013-01-06 DIAGNOSIS — R079 Chest pain, unspecified: Secondary | ICD-10-CM

## 2013-01-06 MED ORDER — METOPROLOL SUCCINATE ER 100 MG PO TB24: 100.0000 mg | ORAL_TABLET | Freq: Two times a day (BID) | ORAL | Status: DC

## 2013-01-11 ENCOUNTER — Encounter: Payer: Self-pay | Admitting: Internal Medicine

## 2013-01-11 NOTE — Assessment & Plan Note (Signed)
Persistent problem for her.  Sees GI.  Takes chronic narcotic pain meds. Stay hydrated.  Continue follow up with GI.

## 2013-01-11 NOTE — Assessment & Plan Note (Signed)
On Lipitor.  Low cholesterol diet and exercise.  Follow lipid proflie and liver panel   

## 2013-01-11 NOTE — Assessment & Plan Note (Signed)
No significant cough reported today.  Some hoarseness.  Continue treatment for reflux.  Saline nasal spray.  Steroid nasal spray as directed.  Evaluated by Dr Kathyrn Sheriff.

## 2013-01-11 NOTE — Assessment & Plan Note (Signed)
On synthroid.  Follow tsh.   

## 2013-01-11 NOTE — Assessment & Plan Note (Signed)
Have discussed the need for sleep study and titration study.  She has declined.  Follow.

## 2013-01-11 NOTE — Assessment & Plan Note (Signed)
Followed by Dr Lake Bells.

## 2013-01-11 NOTE — Assessment & Plan Note (Signed)
Follow cbc and ferritin.  

## 2013-01-11 NOTE — Assessment & Plan Note (Signed)
Continue follow up with ortho and pain clinic.  Follow.

## 2013-01-11 NOTE — Progress Notes (Signed)
Subjective:    Patient ID: Kimberly Roy, female    DOB: [birth year], 79 y.o.   MRN: [id number]  Urinary Tract Infection   79 year old female with past history of hypertension, hypercholesterolemia, hypothyroidism, anemia, chronic constipation and pulmonary hypertension (new diagnosis).  She comes in today to follow up on these issues as well as for a complete physical exam.  Had been having issues with her feet burning.  On gabapentin 329m bid.  She is due to see Dr HMarry Guanthis week.  Describes some hoarseness.  Saw Dr JKathyrn Sheriff  Nose is dry.  Discussed using saline nasal spray.  Breathing stable.  Eating and drinking well.  Some constipation.  Previous urinary tract infection - cleared.     Past Medical History  Diagnosis Date  . Osteoarthritis     knees/cervical and lumbar spine  . Hypertension   . Hyperlipidemia   . Hypothyroidism   . Anemia   . Depression   . Anxiety   . Nephrolithiasis   . Interstitial cystitis   . Pulmonary nodules     followed by Dr FRaul Del . Fatty liver   . Renal cyst     right  . Pulmonary hypertension     Current Outpatient Prescriptions on File Prior to Visit  Medication Sig Dispense Refill  . amitriptyline (ELAVIL) 25 MG tablet Take 1 tablet (25 mg total) by mouth at bedtime.  30 tablet  5  . amLODipine (NORVASC) 10 MG tablet Take 1 tablet (10 mg total) by mouth daily.  90 tablet  1  . atorvastatin (LIPITOR) 20 MG tablet Take one tablet by mouth daily at bedtime  90 tablet  1  . furosemide (LASIX) 20 MG tablet Take 20 mg by mouth daily as needed.       . hydrochlorothiazide (HYDRODIURIL) 25 MG tablet Take 1 tablet (25 mg total) by mouth daily.  90 tablet  1  . HYDROcodone-acetaminophen (NORCO) 10-325 MG per tablet Take 1 tablet by mouth every 4 (four) hours as needed.      .Marland Kitchenmorphine (MS CONTIN) 100 MG 12 hr tablet Take 100 mg by mouth 3 (three) times daily.      . Multiple Vitamins-Minerals (CENTRUM SILVER ADULT 50+ PO) Take by mouth daily.      .  nitrofurantoin, macrocrystal-monohydrate, (MACROBID) 100 MG capsule Take 1 capsule (100 mg total) by mouth 2 (two) times daily.  10 capsule  0  . pantoprazole (PROTONIX) 40 MG injection Take 40 mg by mouth 2 (two) times daily.       . polyethylene glycol powder (MIRALAX) powder Take 17 g by mouth daily.      . ADCIRCA 20 MG TABS Take 2 tablets by mouth daily.      .Marland Kitchenalendronate (FOSAMAX) 70 MG tablet Take 70 mg by mouth every 7 (seven) days.       . diclofenac sodium (VOLTAREN) 1 % GEL Apply topically.      . mometasone (NASONEX) 50 MCG/ACT nasal spray Place 2 sprays into the nose daily.  17 g  3  . nystatin cream (MYCOSTATIN) Apply topically 2 (two) times daily.  30 g  1  . sodium chloride (OCEAN) 0.65 % nasal spray Place 1 spray into the nose as needed.       No current facility-administered medications on file prior to visit.    Review of Systems Patient denies any headache, lightheadedness or dizziness.  Some hoarseness.  Feels like something in her throat.  No chest pain, tightness or palpitations.  No increased shortness of breath, cough or congestion.  Breathing stable.  No nausea or vomiting.  No abdominal pain or cramping.  No bowel change, such as diarrhea, BRBPR or melana.  Some constipation.  No urine change.          Objective:   Physical Exam  Filed Vitals:   11/30/12 1038  BP: 142/70  Pulse: 72  Temp: 98.6 F (37 C)   Blood pressure recheck:  134/68, pulse 34  79 year old female in no acute distress.   HEENT:  Nares- clear.  Oropharynx - without lesions. NECK:  Supple.  Nontender.  No audible bruit.  HEART:  Appears to be regular. LUNGS:  No crackles or wheezing audible.  Respirations even and unlabored.  RADIAL PULSE:  Equal bilaterally.    BREASTS:  No nipple discharge or nipple retraction present.  Could not appreciate any distinct nodules or axillary adenopathy.  ABDOMEN:  Soft, nontender.  Bowel sounds present and normal.  No audible abdominal bruit.   EXTREMITIES:  No increased edema present.  DP pulses palpable and equal bilaterally.          Assessment & Plan:  PREVIOUS UTI.  Symptoms Resolved.       CARDIOVASCULAR.  Sees Dr Saralyn Pilar.  Currently symptoms stable.  Continue risk factor modification.    HEALTH MAINTENANCE.  Physical today.  Being followed by GI.  Pap 04/05/09 within normal limits.  Mammogram 07/27/12 -  Birads II.

## 2013-01-11 NOTE — Assessment & Plan Note (Signed)
Blood pressure under reasonable control on current medication regimen.  Follow.      

## 2013-01-12 ENCOUNTER — Other Ambulatory Visit: Payer: Self-pay | Admitting: Pulmonary Disease

## 2013-01-12 ENCOUNTER — Encounter: Payer: Self-pay | Admitting: Pulmonary Disease

## 2013-01-12 DIAGNOSIS — G4733 Obstructive sleep apnea (adult) (pediatric): Secondary | ICD-10-CM

## 2013-01-12 NOTE — Progress Notes (Signed)
Quick Note:  Spoke with pt and notified of results per Dr. Lake Bells Pt verbalized understanding and denied any questions. Order sent to [facility] for CPAP start with download  ______

## 2013-01-20 ENCOUNTER — Telehealth: Payer: Self-pay | Admitting: Pulmonary Disease

## 2013-01-20 NOTE — Telephone Encounter (Signed)
Order was faxed to [place] 01/12/13 I called [employer] and was advised Melissa that they were planning on calling tp today to discuss everything with her ATC pt line has fast busy signal. [facility]

## 2013-01-21 NOTE — Telephone Encounter (Signed)
Pt advised [employer] will be contacting them and if she does not hear from them to please let us know. This message was left on voicemail as per DPR. [place], CMA

## 2013-01-22 ENCOUNTER — Encounter: Payer: Self-pay | Admitting: Internal Medicine

## 2013-01-22 ENCOUNTER — Ambulatory Visit (INDEPENDENT_AMBULATORY_CARE_PROVIDER_SITE_OTHER): Payer: Medicare Other | Admitting: Internal Medicine

## 2013-01-22 VITALS — BP 130/60 | HR 76 | Temp 98.3°F | Ht 60.0 in | Wt 239.2 lb

## 2013-01-22 DIAGNOSIS — R05 Cough: Secondary | ICD-10-CM

## 2013-01-22 DIAGNOSIS — G4733 Obstructive sleep apnea (adult) (pediatric): Secondary | ICD-10-CM

## 2013-01-22 DIAGNOSIS — I2789 Other specified pulmonary heart diseases: Secondary | ICD-10-CM

## 2013-01-22 DIAGNOSIS — R059 Cough, unspecified: Secondary | ICD-10-CM

## 2013-01-22 DIAGNOSIS — M7989 Other specified soft tissue disorders: Secondary | ICD-10-CM

## 2013-01-22 DIAGNOSIS — I272 Pulmonary hypertension, unspecified: Secondary | ICD-10-CM

## 2013-01-22 DIAGNOSIS — D649 Anemia, unspecified: Secondary | ICD-10-CM

## 2013-01-22 DIAGNOSIS — I10 Essential (primary) hypertension: Secondary | ICD-10-CM

## 2013-01-24 ENCOUNTER — Encounter: Payer: Self-pay | Admitting: Internal Medicine

## 2013-01-24 DIAGNOSIS — M7989 Other specified soft tissue disorders: Secondary | ICD-10-CM | POA: Insufficient documentation

## 2013-01-24 NOTE — Assessment & Plan Note (Signed)
Followed by Dr Lake Bells.  Had her sleep study.  Planning to get her CPAP set up soon.

## 2013-01-24 NOTE — Assessment & Plan Note (Signed)
Blood pressure under reasonable control on current medication regimen.  Follow.      

## 2013-01-24 NOTE — Assessment & Plan Note (Signed)
Multifactorial.  Has some venous insufficiency.  Wears support hose.  The support hose are not fitting as well now.  Refer back for fitting and a new pair of hose.  Continue leg elevation.  Avoid increased salt/sodium intake.  Treat the sleep apnea.  Follow.

## 2013-01-24 NOTE — Progress Notes (Signed)
Subjective:    Patient ID: Kimberly Roy, female    DOB: [birth year], 79 y.o.   MRN: [id number]  HPI 79 year old female with past history of hypertension, hypercholesterolemia, hypothyroidism, anemia, chronic constipation and pulmonary hypertension (new diagnosis).  She comes in today as a work in with concerns regarding some increased lower extremity swelling.   Accompanied by her husband.  History obtained from both of them.  Breathing is stable.  No chest pain or tightness.  No nausea or vomiting.  Wearing her compression stockings.  Has had them for six months.  They are "bunching up at her ankles".  Will have increased swelling where they are "bunched up".  Had some burning in her feet.  Gabapentin helping this.  Does make her sleepy.  She plans to discuss with ortho.   Eating and drinking well.  Having some issues with hoarseness and increased "clearing of her throat".  Saw Dr Kathyrn Sheriff.  Treating allergies.  Feels this will improve with treating her sleep apnea.  Plans to get her CPAP soon.     Past Medical History  Diagnosis Date  . Osteoarthritis     knees/cervical and lumbar spine  . Hypertension   . Hyperlipidemia   . Hypothyroidism   . Anemia   . Depression   . Anxiety   . Nephrolithiasis   . Interstitial cystitis   . Pulmonary nodules     followed by Dr Raul Del  . Fatty liver   . Renal cyst     right  . Pulmonary hypertension     Current Outpatient Prescriptions on File Prior to Visit  Medication Sig Dispense Refill  . ADCIRCA 20 MG TABS Take 2 tablets by mouth daily.      Marland Kitchen alendronate (FOSAMAX) 70 MG tablet Take 70 mg by mouth every 7 (seven) days.       Marland Kitchen amitriptyline (ELAVIL) 25 MG tablet Take 1 tablet (25 mg total) by mouth at bedtime.  30 tablet  5  . amLODipine (NORVASC) 10 MG tablet Take 1 tablet (10 mg total) by mouth daily.  90 tablet  1  . atorvastatin (LIPITOR) 20 MG tablet Take one tablet by mouth daily at bedtime  90 tablet  1  . benazepril (LOTENSIN) 40 MG  tablet TAKE ONE (1) TABLET EACH DAY  90 tablet  1  . Calcium-Magnesium-Vitamin D 600-40-500 MG-MG-UNIT TB24 Take 630 mg by mouth 4 (four) times daily.      [name] Sodium 30-100 MG CAPS Take by mouth.      . diclofenac sodium (VOLTAREN) 1 % GEL Apply topically.      . furosemide (LASIX) 20 MG tablet Take 20 mg by mouth daily as needed.       . gabapentin (NEURONTIN) 300 MG capsule Take 300 mg by mouth 3 (three) times daily.      . hydrochlorothiazide (HYDRODIURIL) 25 MG tablet Take 1 tablet (25 mg total) by mouth daily.  90 tablet  1  . HYDROcodone-acetaminophen (NORCO) 10-325 MG per tablet Take 1 tablet by mouth every 4 (four) hours as needed.      . metoprolol succinate (TOPROL-XL) 100 MG 24 hr tablet Take 1 tablet (100 mg total) by mouth 2 (two) times daily. Take with or immediately following a meal.  60 tablet  5  . mometasone (NASONEX) 50 MCG/ACT nasal spray Place 2 sprays into the nose daily.  17 g  3  . morphine (MS CONTIN) 100 MG 12 hr tablet Take 100  mg by mouth 3 (three) times daily.      . Multiple Vitamins-Minerals (CENTRUM SILVER ADULT 50+ PO) Take by mouth daily.      . nitrofurantoin, macrocrystal-monohydrate, (MACROBID) 100 MG capsule Take 1 capsule (100 mg total) by mouth 2 (two) times daily.  10 capsule  0  . nystatin cream (MYCOSTATIN) Apply topically 2 (two) times daily.  30 g  1  . pantoprazole (PROTONIX) 40 MG injection Take 40 mg by mouth 2 (two) times daily.       . polyethylene glycol powder (MIRALAX) powder Take 17 g by mouth daily.      . sodium chloride (OCEAN) 0.65 % nasal spray Place 1 spray into the nose as needed.      Marland Kitchen SYNTHROID 112 MCG tablet TAKE ONE (1) TABLET EACH DAY  90 tablet  1   No current facility-administered medications on file prior to visit.    Review of Systems Patient denies any headache, lightheadedness or dizziness.  No chest pain, tightness or palpitations. Breathing is stable.  No significant increased cough.  Some allergy  symptoms - controlled with Nasonex.  Some hoarseness and increased clearing of her throat reported.   Seeing Dr Kathyrn Sheriff.  No nausea or vomiting.  No abdominal pain or cramping currently.  No BRBPR or melana.   No urine change.  No vaginal problems.  Swelling as outlined.       Objective:   Physical Exam  Filed Vitals:   01/22/13 1016  BP: 130/60  Pulse: 76  Temp: 98.3 F (18.63 C)   79 year old female in no acute distress.   HEENT:  Nares - clear.  OP- without lesions or erythema.  NECK:  Supple, nontender.  No audible bruit.   HEART:  Appears to be regular. LUNGS:  Without crackles or wheezing audible.  Respirations even and unlabored.   RADIAL PULSE:  Equal bilaterally.  ABDOMEN:  Soft, nontender.  No audible abdominal bruit.   EXTREMITIES:  Support hose in place.  Some pedal and ankle edema.  Hose tighter and "bunching up" at her ankle.       Assessment & Plan:  HEALTH MAINTENANCE.  Will review records from outside.  Keep on track with her physicals.  Colonoscopy as outlined.    CARDIOVASCULAR.  Sees Dr Saralyn Pilar.  Currently symptoms stable.  Continue risk factor modification.    QUESTIONABLE NEUROPATHY.  On gabapentin and this has helped the pain.  Plans to discuss with ortho regarding some increased somnolence.     HEALTH MAINTENANCE.  Physical 11/30/12.  Being followed by GI.  Pap 04/05/09 within normal limits.  Mammogram 07/27/12 -  Birads II.

## 2013-01-24 NOTE — Assessment & Plan Note (Signed)
Has had the sleep study.  Plans to get her CPAP set up soon.  Feel this will help with the swelling and the daytime somnolence.  Follow.

## 2013-01-24 NOTE — Assessment & Plan Note (Signed)
No significant cough reported today.  Some hoarseness.  Continue treatment for reflux.  Saline nasal spray.  Steroid nasal spray as directed.  Evaluated by Dr Kathyrn Sheriff.  Did  Discuss the possibility of ACE inhibitor cough.  Will follow.

## 2013-01-24 NOTE — Assessment & Plan Note (Signed)
Follow cbc and ferritin.  Last hgb stable (10).

## 2013-01-27 ENCOUNTER — Telehealth: Payer: Self-pay | Admitting: *Deleted

## 2013-01-27 NOTE — Telephone Encounter (Signed)
Rx faxed

## 2013-01-27 NOTE — Telephone Encounter (Signed)
Pharmacy called re: Rx for compression hose. They need a Rx to state 30-40 compression. (fax# [phone])

## 2013-01-27 NOTE — Telephone Encounter (Signed)
rx written and placed in your box.

## 2013-02-01 ENCOUNTER — Other Ambulatory Visit: Payer: Self-pay | Admitting: Internal Medicine

## 2013-02-23 ENCOUNTER — Encounter: Payer: Self-pay | Admitting: Pulmonary Disease

## 2013-02-24 ENCOUNTER — Telehealth: Payer: Self-pay | Admitting: *Deleted

## 2013-02-24 NOTE — Telephone Encounter (Signed)
Message copied by [name] on Wed Feb 24, 2013  5:24 PM ------      Message from: [name]      Created: Tue Feb 23, 2013  5:45 PM       L,            Can we make sure that her home autotitrating CPAP device is set for 6-10cm H20 autotitrate?            Thanks,      [name] ------

## 2013-02-24 NOTE — Telephone Encounter (Signed)
LMTCB to see what her CPAP is on

## 2013-02-25 ENCOUNTER — Telehealth: Payer: Self-pay | Admitting: Pulmonary Disease

## 2013-02-25 NOTE — Telephone Encounter (Signed)
Pt is returning [name]'s call & can be reached at [phone].  Satira Anis

## 2013-02-25 NOTE — Telephone Encounter (Signed)
Spoke with pt and verified CPAP setting on auto 6-10  She states that BQ had called and spoke with her about this yesterday Nothing further needed and so will close encounter

## 2013-02-25 NOTE — Telephone Encounter (Signed)
Message (from 02/24/13)  closed in error copied below:  Satira Anis at 02/25/2013 11:38 AM   Status: Signed            Pt is returning [name]'s call & can be reached at [phone]. [name] [place], [place] at 02/24/2013 5:27 PM    Status: Signed             LMTCB to see what her CPAP is on          [name], [place] at 02/24/2013 5:24 PM    Status: Signed             Message copied by [name] on Wed Feb 24, 2013 5:24 PM  ------  Message from: [name]  Created: Tue Feb 23, 2013 5:45 PM  L,  Can we make sure that her home autotitrating CPAP device is set for 6-10cm H20 autotitrate?  Thanks,  [name]  ------             Encounter MyChart Messages    No messages in this encounter         Routing History    Priority Sent On From To Message Type    02/24/2013 5:27 PM [name], [place], CMA Patient Calls      Created by    [name], CMA on 02/24/2013 05:24 PM                           [place], [place]         Contacts      Type Contact Phone   02/24/2013 5:24 PM Phone ([address]) Kimberly Roy, Kimberly Roy (Self) [phone] (H)

## 2013-02-25 NOTE — Telephone Encounter (Signed)
Already handled this msg, see other phone note

## 2013-03-02 ENCOUNTER — Encounter: Payer: Self-pay | Admitting: Pulmonary Disease

## 2013-03-02 ENCOUNTER — Ambulatory Visit (INDEPENDENT_AMBULATORY_CARE_PROVIDER_SITE_OTHER): Payer: Medicare Other | Admitting: Pulmonary Disease

## 2013-03-02 VITALS — BP 130/82 | HR 63 | Temp 98.7°F | Ht 60.0 in

## 2013-03-02 DIAGNOSIS — R918 Other nonspecific abnormal finding of lung field: Secondary | ICD-10-CM

## 2013-03-02 DIAGNOSIS — I2789 Other specified pulmonary heart diseases: Secondary | ICD-10-CM

## 2013-03-02 DIAGNOSIS — I272 Pulmonary hypertension, unspecified: Secondary | ICD-10-CM

## 2013-03-02 DIAGNOSIS — G4733 Obstructive sleep apnea (adult) (pediatric): Secondary | ICD-10-CM

## 2013-03-02 NOTE — Assessment & Plan Note (Signed)
Due to OSA, CHF, and obesity.  No role for pulmonary vasodilators.  Plan: -continue CPAP

## 2013-03-02 NOTE — Assessment & Plan Note (Signed)
She has been very compliant with her CPAP machine.  Her mild nasal congestion and dryness is due in part to the CPAP machine.  Plan: -continue CPAP as written -add saline gel at night to nose

## 2013-03-02 NOTE — Progress Notes (Signed)
Subjective:    Patient ID: Kimberly Roy, female    DOB: [birth year], 79 y.o.   MRN: [id number]  Synopsis: Kimberly Roy first saw the [facility] pulmonary clinic in November of 2013 for evaluation of pulmonary hypertension. In November 2012 she had a left heart cath and right heart cath. Her left heart catheterization showed no obstructive coronary disease the right heart cath showed a mean PA pressure of 35. This is associated with a cardiac index of 2.99 as well as a wedge pressure of 21. An echocardiogram at that time showed an LVEF of 40-45% and right ventricular dilation. She had used Adcirca but this did not improve her symptoms. She noted symptoms consistent with obstructive sleep apnea and never had a polysomnogram.  HPI   12/29/2012 ROV- Since our last visit Kimberly Roy's symptoms have not worsened significantly up until about 2 months ago. At that point she developed sinus congestion and saw her ear nose and throat physician who gave her an antibiotic. She said initially this helped but she has had persistent symptoms since. She describes sinus congestion postnasal drip and runny nose which is nearly continuous. This is associated with hoarseness and cough. She has had some chest tightness as well. She uses Nasonex but nothing else for her sinuses other than saline rinses at night. She has not been back to her ear, nose and throat physician. She has experienced some shortness of breath in the last several weeks as well. Her leg swelling has been worse lately but her weight has been stable. She attributes the leg swelling problem to not wearing her compression stockings. She has noticed some chest tightness in the last several weeks as well that is constant, substernal, and nonradiating.   03/02/2013 ROV >> Since the last visit she has been using the CPAP machine regularly at least 7 hours per night.  This has been validated by the recent download I received one week ago.  She has been experiencing  a lot of nasal congestion lately with the CPAP machine.  She can't tell if there is a major difference in terms of energy level, breathing etc in large part because she has been very immobile due to her hip problems.   Past Medical History  Diagnosis Date  . Osteoarthritis     knees/cervical and lumbar spine  . Hypertension   . Hyperlipidemia   . Hypothyroidism   . Anemia   . Depression   . Anxiety   . Nephrolithiasis   . Interstitial cystitis   . Pulmonary nodules     followed by Dr Raul Del  . Fatty liver   . Renal cyst     right  . Pulmonary hypertension     Review of Systems  Constitutional: Positive for fatigue. Negative for fever and chills.  HENT: Positive for congestion, rhinorrhea, postnasal drip and sinus pressure.   Respiratory: Negative for cough, shortness of breath and wheezing.   Cardiovascular: Positive for leg swelling. Negative for chest pain and palpitations.       Objective:   Physical Exam   Filed Vitals:   03/02/13 1205  BP: 130/82  Pulse: 63  Temp: 98.7 F (37.1 C)  TempSrc: Oral  Height: 5' (1.524 m)  SpO2: 94%  RA  Gen: obese, chronically ill appearing, no acute distress HEENT: NCAT,  some nasal mucosal erythema PULM: CTA B CV: RRR, systolic murmur RUSB, no JVD AB: BS+, soft, nontender, no hsm Ext: warm, pitting edema in legs, compression stockings in  place, no clubbing, no cyanosis    03/2012 Full PFT >> Ratio 82%, FEV 1.45 L (82%), TLC 2.99L (72% pred) DLCO 53% pred 07/2011 LHC >> normal coronary anatomy 07/2011 RHC >> Wedge 21; PA mean 35, 2.99 woods units; CO 4.6 L/min, CI 2.3 L/min/m2 07/2011 TTE >> mild RV dilation, LVEF 40-45%  12/29/2012 EKG > poor study due to TENS unit, no clear ST wave changes     Assessment & Plan:   OSA (obstructive sleep apnea) She has been very compliant with her CPAP machine.  Her mild nasal congestion and dryness is due in part to the CPAP machine.  Plan: -continue CPAP as written -add saline  gel at night to nose  Pulmonary hypertension Due to OSA, CHF, and obesity.  No role for pulmonary vasodilators.  Plan: -continue CPAP    Updated Medication List Outpatient Encounter Prescriptions as of 03/02/2013  Medication Sig Dispense Refill  . ADCIRCA 20 MG TABS Take 2 tablets by mouth daily.      Marland Kitchen alendronate (FOSAMAX) 70 MG tablet Take 70 mg by mouth every 7 (seven) days.       Marland Kitchen amitriptyline (ELAVIL) 25 MG tablet Take 1 tablet (25 mg total) by mouth at bedtime.  30 tablet  5  . amLODipine (NORVASC) 10 MG tablet Take 1 tablet (10 mg total) by mouth daily.  90 tablet  1  . atorvastatin (LIPITOR) 20 MG tablet Take one tablet by mouth daily at bedtime  90 tablet  1  . benazepril (LOTENSIN) 40 MG tablet TAKE ONE (1) TABLET EACH DAY  90 tablet  1  . Calcium-Magnesium-Vitamin D 600-40-500 MG-MG-UNIT TB24 Take 630 mg by mouth 4 (four) times daily.      [name] Sodium 30-100 MG CAPS Take by mouth.      . diclofenac sodium (VOLTAREN) 1 % GEL Apply topically.      . furosemide (LASIX) 20 MG tablet Take 20 mg by mouth daily as needed.       . gabapentin (NEURONTIN) 300 MG capsule Take 300 mg by mouth 3 (three) times daily.      . hydrochlorothiazide (HYDRODIURIL) 25 MG tablet Take 1 tablet (25 mg total) by mouth daily.  90 tablet  1  . HYDROcodone-acetaminophen (NORCO) 10-325 MG per tablet Take 1 tablet by mouth every 4 (four) hours as needed.      . metoprolol succinate (TOPROL-XL) 100 MG 24 hr tablet Take 1 tablet (100 mg total) by mouth 2 (two) times daily. Take with or immediately following a meal.  60 tablet  5  . mometasone (NASONEX) 50 MCG/ACT nasal spray Place 2 sprays into the nose daily.  17 g  3  . morphine (MS CONTIN) 100 MG 12 hr tablet Take 100 mg by mouth 3 (three) times daily.      . Multiple Vitamins-Minerals (CENTRUM SILVER ADULT 50+ PO) Take by mouth daily.      . nitrofurantoin, macrocrystal-monohydrate, (MACROBID) 100 MG capsule Take 1 capsule (100 mg  total) by mouth 2 (two) times daily.  10 capsule  0  . nystatin cream (MYCOSTATIN) Apply topically 2 (two) times daily.  30 g  1  . pantoprazole (PROTONIX) 40 MG injection Take 40 mg by mouth 2 (two) times daily.       . polyethylene glycol powder (GLYCOLAX/MIRALAX) powder MIX 17GM (AS MARKED IN BOTTLE TOP) IN 8 OUNCES OF WATER, MIX AND DRINK ONCE A DAY AS DIRECTED.  527 g  11  . sodium  chloride (OCEAN) 0.65 % nasal spray Place 1 spray into the nose as needed.      Marland Kitchen SYNTHROID 112 MCG tablet TAKE ONE (1) TABLET EACH DAY  90 tablet  1   No facility-administered encounter medications on file as of 03/02/2013.

## 2013-03-02 NOTE — Patient Instructions (Signed)
Use saline gel every night before you go to bed and use the CPAP machine  We will see you back in 6 months or sooner if needed

## 2013-03-09 ENCOUNTER — Other Ambulatory Visit: Payer: Self-pay | Admitting: *Deleted

## 2013-03-09 MED ORDER — FUROSEMIDE 20 MG PO TABS: 20.0000 mg | ORAL_TABLET | Freq: Every day | ORAL | Status: DC | PRN

## 2013-03-30 ENCOUNTER — Ambulatory Visit: Payer: Medicare Other | Admitting: Pulmonary Disease

## 2013-04-02 ENCOUNTER — Ambulatory Visit: Payer: Medicare Other | Admitting: Internal Medicine

## 2013-04-05 ENCOUNTER — Ambulatory Visit: Payer: Medicare Other | Admitting: Internal Medicine

## 2013-04-07 ENCOUNTER — Other Ambulatory Visit: Payer: Self-pay

## 2013-04-08 ENCOUNTER — Telehealth: Payer: Self-pay | Admitting: *Deleted

## 2013-04-08 ENCOUNTER — Encounter: Payer: Self-pay | Admitting: Internal Medicine

## 2013-04-08 ENCOUNTER — Ambulatory Visit (INDEPENDENT_AMBULATORY_CARE_PROVIDER_SITE_OTHER): Payer: Medicare Other | Admitting: Internal Medicine

## 2013-04-08 VITALS — BP 120/60 | HR 66 | Temp 97.9°F | Ht 60.0 in | Wt 235.5 lb

## 2013-04-08 DIAGNOSIS — K5900 Constipation, unspecified: Secondary | ICD-10-CM

## 2013-04-08 DIAGNOSIS — I2789 Other specified pulmonary heart diseases: Secondary | ICD-10-CM

## 2013-04-08 DIAGNOSIS — M7989 Other specified soft tissue disorders: Secondary | ICD-10-CM

## 2013-04-08 DIAGNOSIS — E039 Hypothyroidism, unspecified: Secondary | ICD-10-CM

## 2013-04-08 DIAGNOSIS — I10 Essential (primary) hypertension: Secondary | ICD-10-CM

## 2013-04-08 DIAGNOSIS — K5909 Other constipation: Secondary | ICD-10-CM

## 2013-04-08 DIAGNOSIS — R490 Dysphonia: Secondary | ICD-10-CM

## 2013-04-08 DIAGNOSIS — D649 Anemia, unspecified: Secondary | ICD-10-CM

## 2013-04-08 DIAGNOSIS — E7800 Pure hypercholesterolemia, unspecified: Secondary | ICD-10-CM

## 2013-04-08 DIAGNOSIS — G4733 Obstructive sleep apnea (adult) (pediatric): Secondary | ICD-10-CM

## 2013-04-08 DIAGNOSIS — I272 Pulmonary hypertension, unspecified: Secondary | ICD-10-CM

## 2013-04-08 NOTE — Telephone Encounter (Signed)
Pt was seen this morning, She wanted to know if she could change to a generic for Synthroid (name brand cost $70/month). Please advise. [place]

## 2013-04-08 NOTE — Progress Notes (Signed)
Subjective:    Patient ID: [name], female    DOB: 12-04-1930, 79 y.o.   MRN: [id number]  HPI 79 year old female with past history of hypertension, hypercholesterolemia, hypothyroidism, anemia, chronic constipation and pulmonary hypertension (new diagnosis).  She comes in today for a scheduled follow up.   Accompanied by her husband.  History obtained from both of them.  Breathing is stable.  No chest pain or tightness.  No nausea or vomiting.  Wearing her compression stockings.  Has some burning in her feet.   Gabapentin helping this.  Feels she may need to increase the dose.  Plans to discuss with Dr Shanon Brow office.  Eating and drinking well.  Having some issues with hoarseness and increased "clearing of her throat".  Saw Dr Kathyrn Sheriff.  Treating allergies. Does not feel improving.  States he mentioned possible acid reflux.  S/p laryngoscopy.   Using her CPAP at times.  Some nights, does not feel she tolerates as well.  Some increased fatigue.  Not sleeping as well at times.      Past Medical History  Diagnosis Date  . Osteoarthritis     knees/cervical and lumbar spine  . Hypertension   . Hyperlipidemia   . Hypothyroidism   . Anemia   . Depression   . Anxiety   . Nephrolithiasis   . Interstitial cystitis   . Pulmonary nodules     followed by Dr Raul Del  . Fatty liver   . Renal cyst     right  . Pulmonary hypertension     Current Outpatient Prescriptions on File Prior to Visit  Medication Sig Dispense Refill  . ADCIRCA 20 MG TABS Take 2 tablets by mouth daily.      Marland Kitchen alendronate (FOSAMAX) 70 MG tablet Take 70 mg by mouth every 7 (seven) days.       Marland Kitchen amitriptyline (ELAVIL) 25 MG tablet Take 1 tablet (25 mg total) by mouth at bedtime.  30 tablet  5  . amLODipine (NORVASC) 10 MG tablet Take 1 tablet (10 mg total) by mouth daily.  90 tablet  1  . atorvastatin (LIPITOR) 20 MG tablet Take one tablet by mouth daily at bedtime  90 tablet  1  . benazepril (LOTENSIN) 40 MG tablet TAKE  ONE (1) TABLET EACH DAY  90 tablet  1  . Calcium-Magnesium-Vitamin D 600-40-500 MG-MG-UNIT TB24 Take 630 mg by mouth 4 (four) times daily.      [name] Sodium 30-100 MG CAPS Take by mouth.      . diclofenac sodium (VOLTAREN) 1 % GEL Apply topically.      . furosemide (LASIX) 20 MG tablet Take 1 tablet (20 mg total) by mouth daily as needed.  30 tablet  5  . gabapentin (NEURONTIN) 300 MG capsule Take 300 mg by mouth 3 (three) times daily.      . hydrochlorothiazide (HYDRODIURIL) 25 MG tablet Take 1 tablet (25 mg total) by mouth daily.  90 tablet  1  . HYDROcodone-acetaminophen (NORCO) 10-325 MG per tablet Take 1 tablet by mouth every 4 (four) hours as needed.      . metoprolol succinate (TOPROL-XL) 100 MG 24 hr tablet Take 1 tablet (100 mg total) by mouth 2 (two) times daily. Take with or immediately following a meal.  60 tablet  5  . morphine (MS CONTIN) 100 MG 12 hr tablet Take 100 mg by mouth 3 (three) times daily.      . Multiple Vitamins-Minerals (CENTRUM SILVER ADULT  50+ PO) Take by mouth daily.      Marland Kitchen nystatin cream (MYCOSTATIN) Apply topically 2 (two) times daily.  30 g  1  . pantoprazole (PROTONIX) 40 MG injection Take 40 mg by mouth 2 (two) times daily.       . polyethylene glycol powder (GLYCOLAX/MIRALAX) powder MIX 17GM (AS MARKED IN BOTTLE TOP) IN 8 OUNCES OF WATER, MIX AND DRINK ONCE A DAY AS DIRECTED.  527 g  11  . sodium chloride (OCEAN) 0.65 % nasal spray Place 1 spray into the nose as needed.      Marland Kitchen SYNTHROID 112 MCG tablet TAKE ONE (1) TABLET EACH DAY  90 tablet  1   No current facility-administered medications on file prior to visit.    Review of Systems Patient denies any headache, lightheadedness or dizziness.  No chest pain, tightness or palpitations. Breathing is stable.  No significant increased cough.  Some allergy symptoms - controlled with Nasonex.  Some hoarseness and increased clearing of her throat reported.   Saw Dr Kathyrn Sheriff.  Had work up as  outlined.  No nausea or vomiting.  No abdominal pain or cramping currently.  No BRBPR or melana.   No urine change.        Objective:   Physical Exam  Filed Vitals:   04/08/13 0919  BP: 120/60  Pulse: 66  Temp: 97.9 F (16.71 C)   79 year old female in no acute distress.   HEENT:  Nares - clear.  OP- without lesions or erythema.  NECK:  Supple, nontender.  No audible bruit.   HEART:  Appears to be regular. LUNGS:  Without crackles or wheezing audible.  Respirations even and unlabored.   RADIAL PULSE:  Equal bilaterally.  ABDOMEN:  Soft, nontender.  No audible abdominal bruit.   EXTREMITIES:  Support hose in place.  Swelling improved.        Assessment & Plan:  CARDIOVASCULAR.  Sees Dr Saralyn Pilar.  Currently symptoms stable.  Continue risk factor modification.    QUESTIONABLE NEUROPATHY.  On gabapentin and this has helped the pain.  Plans to discuss with ortho regarding increasing the dose.       HEALTH MAINTENANCE.  Physical 11/30/12.  Being followed by GI.  Pap 04/05/09 within normal limits.  Mammogram 07/27/12 -  Birads II.

## 2013-04-08 NOTE — Telephone Encounter (Signed)
My preference would be for her to stay on name brand (if possible).  More steady dosing.  If needs to change, let me know.

## 2013-04-08 NOTE — Telephone Encounter (Signed)
Pt notified 

## 2013-04-09 ENCOUNTER — Encounter: Payer: Self-pay | Admitting: Internal Medicine

## 2013-04-09 ENCOUNTER — Other Ambulatory Visit (INDEPENDENT_AMBULATORY_CARE_PROVIDER_SITE_OTHER): Payer: Medicare Other

## 2013-04-09 DIAGNOSIS — E039 Hypothyroidism, unspecified: Secondary | ICD-10-CM

## 2013-04-09 DIAGNOSIS — D649 Anemia, unspecified: Secondary | ICD-10-CM

## 2013-04-09 DIAGNOSIS — E7800 Pure hypercholesterolemia, unspecified: Secondary | ICD-10-CM

## 2013-04-09 DIAGNOSIS — I10 Essential (primary) hypertension: Secondary | ICD-10-CM

## 2013-04-09 LAB — CBC WITH DIFFERENTIAL/PLATELET
Basophils Relative: 0.5 % (ref 0.0–3.0)
Eosinophils Absolute: 0.2 10*3/uL (ref 0.0–0.7)
Hemoglobin: 11.3 g/dL — ABNORMAL LOW (ref 12.0–15.0)
Lymphocytes Relative: 28.1 % (ref 12.0–46.0)
MCHC: 33.6 g/dL (ref 30.0–36.0)
Neutro Abs: 5 10*3/uL (ref 1.4–7.7)
RBC: 3.55 Mil/uL — ABNORMAL LOW (ref 3.87–5.11)

## 2013-04-09 LAB — BASIC METABOLIC PANEL
CO2: 31 mEq/L (ref 19–32)
Calcium: 9.2 mg/dL (ref 8.4–10.5)
Chloride: 100 mEq/L (ref 96–112)
Sodium: 140 mEq/L (ref 135–145)

## 2013-04-09 LAB — HEPATIC FUNCTION PANEL
ALT: 13 U/L (ref 0–35)
Total Bilirubin: 0.6 mg/dL (ref 0.3–1.2)

## 2013-04-09 LAB — VITAMIN B12: Vitamin B-12: >1500 pg/mL — ABNORMAL HIGH (ref 211–911)

## 2013-04-09 LAB — LIPID PANEL
LDL Cholesterol: 60 mg/dL (ref 0–99)
VLDL: 28.2 mg/dL (ref 0.0–40.0)

## 2013-04-09 LAB — FERRITIN: Ferritin: 77 ng/mL (ref 10.0–291.0)

## 2013-04-09 NOTE — Assessment & Plan Note (Signed)
Has some nights where she does not tolerate CPAP.  Some fatigue.  Will discuss with [employer] regarding autotitration to confirm correct settings.

## 2013-04-09 NOTE — Assessment & Plan Note (Signed)
Stable with compression hose.  Follow.

## 2013-04-09 NOTE — Addendum Note (Signed)
Addended by: Karlene Einstein D on: 04/09/2013 10:31 AM   Modules accepted: Orders

## 2013-04-09 NOTE — Assessment & Plan Note (Signed)
On Lipitor.  Low cholesterol diet and exercise.  Follow lipid proflie and liver panel   

## 2013-04-09 NOTE — Assessment & Plan Note (Signed)
On synthroid.  Follow tsh.   

## 2013-04-09 NOTE — Assessment & Plan Note (Signed)
Follow cbc and ferritin.  Last hgb stable (10.9).  Given persistent anemia and persistent clearing of her throat and hoarseness, will refer back to GI for evaluation and treatment recommendations.  Hold on cxr at this time.  Sees Dr Lake Bells.

## 2013-04-09 NOTE — Assessment & Plan Note (Signed)
Blood pressure under reasonable control on current medication regimen.  Follow.      

## 2013-04-09 NOTE — Assessment & Plan Note (Signed)
Persistent problem for her.  Sees GI.  Takes chronic narcotic pain meds. Stay hydrated.  Continue follow up with GI.  Currently stable.

## 2013-04-09 NOTE — Assessment & Plan Note (Signed)
Followed by Dr Lake Bells.  Had her sleep study.  CPAP arranged.  Plan for possible autotitration as outlined.

## 2013-04-11 ENCOUNTER — Encounter: Payer: Self-pay | Admitting: Internal Medicine

## 2013-04-19 ENCOUNTER — Other Ambulatory Visit (INDEPENDENT_AMBULATORY_CARE_PROVIDER_SITE_OTHER): Payer: Medicare Other

## 2013-04-19 ENCOUNTER — Telehealth: Payer: Self-pay | Admitting: Internal Medicine

## 2013-04-19 DIAGNOSIS — R350 Frequency of micturition: Secondary | ICD-10-CM

## 2013-04-19 DIAGNOSIS — N390 Urinary tract infection, site not specified: Secondary | ICD-10-CM

## 2013-04-19 LAB — POCT URINALYSIS DIPSTICK
Bilirubin, UA: NEGATIVE
Blood, UA: NEGATIVE
Glucose, UA: NEGATIVE
Ketones, UA: NEGATIVE
Spec Grav, UA: 1.015
Urobilinogen, UA: 0.2

## 2013-04-19 NOTE — Telephone Encounter (Signed)
Pt went ahead and brought in a sample of urine to have lab test for bladder infection. I let him know that we don't have pt's drop off urine like that but he insisted that he should bring it in and have it checked. Urine is in lab.

## 2013-04-19 NOTE — Telephone Encounter (Signed)
Order placed for urine culture

## 2013-04-19 NOTE — Telephone Encounter (Signed)
Notify pt that her initial urine looks good.  No evidence of infection.  Will add urine culture to confirm no infection.

## 2013-04-19 NOTE — Telephone Encounter (Signed)
Pt notified

## 2013-04-19 NOTE — Telephone Encounter (Signed)
Pt thinks she has a UTI.  Asking if husband can drop off a sample for her.  He would arrive to the office around 2:30 p.m., so she needs to know before then if this is okay.

## 2013-04-19 NOTE — Telephone Encounter (Signed)
See below

## 2013-04-20 NOTE — Addendum Note (Signed)
Addended by: Karlene Einstein D on: 04/20/2013 10:59 AM   Modules accepted: Orders

## 2013-04-22 ENCOUNTER — Encounter: Payer: Self-pay | Admitting: Internal Medicine

## 2013-04-22 LAB — URINE CULTURE

## 2013-05-10 ENCOUNTER — Ambulatory Visit: Payer: Medicare Other | Admitting: Internal Medicine

## 2013-05-10 ENCOUNTER — Ambulatory Visit (INDEPENDENT_AMBULATORY_CARE_PROVIDER_SITE_OTHER): Payer: Medicare Other | Admitting: Internal Medicine

## 2013-05-10 ENCOUNTER — Encounter: Payer: Self-pay | Admitting: Internal Medicine

## 2013-05-10 ENCOUNTER — Ambulatory Visit: Payer: Self-pay | Admitting: Internal Medicine

## 2013-05-10 VITALS — BP 122/50 | HR 70 | Temp 98.5°F | Ht 60.0 in | Wt 238.0 lb

## 2013-05-10 DIAGNOSIS — I272 Pulmonary hypertension, unspecified: Secondary | ICD-10-CM

## 2013-05-10 DIAGNOSIS — I10 Essential (primary) hypertension: Secondary | ICD-10-CM

## 2013-05-10 DIAGNOSIS — E039 Hypothyroidism, unspecified: Secondary | ICD-10-CM

## 2013-05-10 DIAGNOSIS — I2789 Other specified pulmonary heart diseases: Secondary | ICD-10-CM

## 2013-05-10 DIAGNOSIS — D649 Anemia, unspecified: Secondary | ICD-10-CM

## 2013-05-10 DIAGNOSIS — M7989 Other specified soft tissue disorders: Secondary | ICD-10-CM

## 2013-05-10 MED ORDER — CEPHALEXIN 500 MG PO CAPS: 500.0000 mg | ORAL_CAPSULE | Freq: Three times a day (TID) | ORAL | Status: DC

## 2013-05-11 ENCOUNTER — Encounter: Payer: Self-pay | Admitting: Internal Medicine

## 2013-05-11 ENCOUNTER — Ambulatory Visit: Payer: Medicare Other | Admitting: Pulmonary Disease

## 2013-05-11 NOTE — Assessment & Plan Note (Signed)
Follow cbc and ferritin.  Last hgb stable (10.9).

## 2013-05-11 NOTE — Assessment & Plan Note (Signed)
Followed by Dr Lake Bells.  Had her sleep study.  CPAP arranged.

## 2013-05-11 NOTE — Assessment & Plan Note (Signed)
Increased swelling.  Has some problems with chronic venous insufficiency.  Left leg worse today.  Increased pain.  Will check lower extremity ultrasound to confirm no DVT.   Has a history of DVT.  Also, with the increased erythema - worse right leg - concern regarding cellulitis.  Some venostasis changes present as well.  Will treat with keflex 534m tid.  Follow.  Will refer back to vascular surgery for treatment of the infection and worsening edema. Has required "wrapping" in the past.

## 2013-05-11 NOTE — Progress Notes (Signed)
Subjective:    Patient ID: Kimberly Roy, female    DOB: 09-10-1930, 79 y.o.   MRN: [id number]  HPI 79 year old female with past history of hypertension, hypercholesterolemia, hypothyroidism, anemia, chronic constipation and pulmonary hypertension (new diagnosis).  She comes in today as a work in with concerns regarding lower extremity swelling and increased redness.   Accompanied by her husband.  History obtained from both of them.  Breathing is stable.  No chest pain or tightness.  No nausea or vomiting.  Wearing her compression stockings.  Was questioning if she needed a new pair. Noticed increased swelling recently.  Left leg is worse.  Noticed increased redness in both legs.  The redness - worse - right leg.   Noticed some blistering on her right leg.  Increased discomfort in her left groin.  Feels this is coming from her back.  Due to f/u with ortho next week.  Having more problems with her left knee.     Past Medical History  Diagnosis Date  . Osteoarthritis     knees/cervical and lumbar spine  . Hypertension   . Hyperlipidemia   . Hypothyroidism   . Anemia   . Depression   . Anxiety   . Nephrolithiasis   . Interstitial cystitis   . Pulmonary nodules     followed by Dr Raul Del  . Fatty liver   . Renal cyst     right  . Pulmonary hypertension     Current Outpatient Prescriptions on File Prior to Visit  Medication Sig Dispense Refill  . alendronate (FOSAMAX) 70 MG tablet Take 70 mg by mouth every 7 (seven) days.       Marland Kitchen amitriptyline (ELAVIL) 25 MG tablet Take 1 tablet (25 mg total) by mouth at bedtime.  30 tablet  5  . amLODipine (NORVASC) 10 MG tablet Take 1 tablet (10 mg total) by mouth daily.  90 tablet  1  . atorvastatin (LIPITOR) 20 MG tablet Take one tablet by mouth daily at bedtime  90 tablet  1  . benazepril (LOTENSIN) 40 MG tablet TAKE ONE (1) TABLET EACH DAY  90 tablet  1  . Calcium-Magnesium-Vitamin D 600-40-500 MG-MG-UNIT TB24 Take 630 mg by mouth 4 (four) times  daily.      [name] Sodium 30-100 MG CAPS Take by mouth.      . furosemide (LASIX) 20 MG tablet Take 1 tablet (20 mg total) by mouth daily as needed.  30 tablet  5  . gabapentin (NEURONTIN) 300 MG capsule Take 300 mg by mouth 3 (three) times daily.      . hydrochlorothiazide (HYDRODIURIL) 25 MG tablet Take 1 tablet (25 mg total) by mouth daily.  90 tablet  1  . HYDROcodone-acetaminophen (NORCO) 10-325 MG per tablet Take 1 tablet by mouth every 4 (four) hours as needed.      . metoprolol succinate (TOPROL-XL) 100 MG 24 hr tablet Take 1 tablet (100 mg total) by mouth 2 (two) times daily. Take with or immediately following a meal.  60 tablet  5  . morphine (MS CONTIN) 100 MG 12 hr tablet Take 100 mg by mouth 3 (three) times daily.      . Multiple Vitamins-Minerals (CENTRUM SILVER ADULT 50+ PO) Take by mouth daily.      Marland Kitchen nystatin cream (MYCOSTATIN) Apply topically 2 (two) times daily.  30 g  1  . pantoprazole (PROTONIX) 40 MG injection Take 40 mg by mouth 2 (two) times daily.       Marland Kitchen  polyethylene glycol powder (GLYCOLAX/MIRALAX) powder MIX 17GM (AS MARKED IN BOTTLE TOP) IN 8 OUNCES OF WATER, MIX AND DRINK ONCE A DAY AS DIRECTED.  527 g  11  . sodium chloride (OCEAN) 0.65 % nasal spray Place 1 spray into the nose as needed.      Marland Kitchen SYNTHROID 112 MCG tablet TAKE ONE (1) TABLET EACH DAY  90 tablet  1  . fluticasone (FLONASE) 50 MCG/ACT nasal spray Place 2 sprays into the nose daily.       No current facility-administered medications on file prior to visit.    Review of Systems Patient denies any headache, lightheadedness or dizziness.  No chest pain, tightness or palpitations. Breathing is stable.  No significant increased cough.   No nausea or vomiting.  No abdominal pain or cramping currently.  No BRBPR or melana.   No urine change.  Lower extremity swelling and redness as outlined.  Increased pain in her left knee and in her left groin.  Feels that some of this is originating from her  back.  The left leg swelling has improved some compared to two days ago.       Objective:   Physical Exam  Filed Vitals:   05/10/13 1329  BP: 122/50  Pulse: 70  Temp: 98.5 F (66.47 C)   79 year old female in no acute distress.  NECK:  Supple, nontender.  No audible bruit.   HEART:  Appears to be regular. LUNGS:  Without crackles or wheezing audible.  Respirations even and unlabored.   RADIAL PULSE:  Equal bilaterally.  ABDOMEN:  Soft, nontender.    EXTREMITIES:  Support hose in place.  Increased swelling bilateral lower extremities - L>R.  Increased erythema - R>L.  DP pulses palpable and equal bilaterally.         Assessment & Plan:  MSK.  Has chronic pain in her back. Persistent problems with her knee.  Increased pain now in her knee and in the left groin.  Check lower extremity ultrasound as outlined.  Has appt with ortho next week.  Offered earlier appt.  She prefers to see Barnet Pall next week.  Will follow.   CARDIOVASCULAR.  Sees Dr Saralyn Pilar.  Currently symptoms stable.  Continue risk factor modification.    QUESTIONABLE NEUROPATHY.  On gabapentin and this has helped the pain.      HEALTH MAINTENANCE.  Physical 11/30/12.  Being followed by GI.  Pap 04/05/09 within normal limits.  Mammogram 07/27/12 -  Birads II.

## 2013-05-11 NOTE — Assessment & Plan Note (Signed)
On synthroid.  Follow tsh.  Recent tsh wnl.

## 2013-05-11 NOTE — Assessment & Plan Note (Signed)
Blood pressure under reasonable control on current medication regimen.  Follow.      

## 2013-05-12 ENCOUNTER — Encounter: Payer: Self-pay | Admitting: Internal Medicine

## 2013-05-13 ENCOUNTER — Encounter: Payer: Self-pay | Admitting: Internal Medicine

## 2013-05-19 ENCOUNTER — Encounter: Payer: Self-pay | Admitting: Internal Medicine

## 2013-05-25 ENCOUNTER — Ambulatory Visit (INDEPENDENT_AMBULATORY_CARE_PROVIDER_SITE_OTHER): Payer: Medicare Other | Admitting: Pulmonary Disease

## 2013-05-25 ENCOUNTER — Encounter: Payer: Self-pay | Admitting: Pulmonary Disease

## 2013-05-25 VITALS — BP 140/80 | HR 67 | Temp 98.5°F | Ht 65.0 in | Wt 238.0 lb

## 2013-05-25 DIAGNOSIS — G4733 Obstructive sleep apnea (adult) (pediatric): Secondary | ICD-10-CM

## 2013-05-25 DIAGNOSIS — Z23 Encounter for immunization: Secondary | ICD-10-CM

## 2013-05-25 NOTE — Patient Instructions (Addendum)
We will call [facility] with the following message:  "Please inspect Kimberly Roy's machine to make sure that it is providing adequate humidity.  We are worried based on her symptoms of sinus congestion that the air is too dry.  If possible, please increase the humidity level."  Call us when you have been using the machine regularly for three weeks and we will get another download   We will see you back in 6 months or sooner if needed

## 2013-05-25 NOTE — Assessment & Plan Note (Signed)
I am concerned that Kimberly Roy has been experiencing increasing nasal congestion because she has been receiving inadequate humidity from her CPAP machine. We're going to ask her home medical supply are to check the machine. It is also unclear to me if she is receiving the appropriate pressure as her husband reports seeing a number on the machine which is markedly lower than what her setting should be.  She has been struggling with claustrophobia but is making progress by using the machine during the daytime.  Plan: -Once she is using the machine again for 3 weeks in a row she will call us for a download so we can see what pressure she needs -We will ask her DME company to check the humidity on the machine and increase it if possible -Followup with me in 6 months.

## 2013-05-25 NOTE — Progress Notes (Signed)
Subjective:    Patient ID: Kimberly Roy, female    DOB: 10-10-1930, 79 y.o.   MRN: [id number]  Synopsis: Kimberly Roy first saw the [facility] pulmonary clinic in November of 2013 for evaluation of pulmonary hypertension. In November 2012 she had a left heart cath and right heart cath. Her left heart catheterization showed no obstructive coronary disease the right heart cath showed a mean PA pressure of 35. This is associated with a cardiac index of 2.99 as well as a wedge pressure of 21. An echocardiogram at that time showed an LVEF of 40-45% and right ventricular dilation. She had used Adcirca but this did not improve her symptoms. She noted symptoms consistent with obstructive sleep apnea and never had a polysomnogram.  HPI   12/29/2012 ROV- Since our last visit Kimberly Roy's symptoms have not worsened significantly up until about 2 months ago. At that point she developed sinus congestion and saw her ear nose and throat physician who gave her an antibiotic. She said initially this helped but she has had persistent symptoms since. She describes sinus congestion postnasal drip and runny nose which is nearly continuous. This is associated with hoarseness and cough. She has had some chest tightness as well. She uses Nasonex but nothing else for her sinuses other than saline rinses at night. She has not been back to her ear, nose and throat physician. She has experienced some shortness of breath in the last several weeks as well. Her leg swelling has been worse lately but her weight has been stable. She attributes the leg swelling problem to not wearing her compression stockings. She has noticed some chest tightness in the last several weeks as well that is constant, substernal, and nonradiating.   03/02/2013 ROV >> Since the last visit she has been using the CPAP machine regularly at least 7 hours per night.  This has been validated by the recent download I received one week ago.  She has been experiencing  a lot of nasal congestion lately with the CPAP machine.  She can't tell if there is a major difference in terms of energy level, breathing etc in large part because she has been very immobile due to her hip problems.  05/25/2013 ROV >> Kimberly Roy has been having trouble with compliance with the CPAP machine lately. She believes it is due to claustrophobia. Unfortunately she went several weeks without using the machine. She has started using the machine again during the daytime to help get use to it. She says that she continues to have sinus congestion at night. She does not have a postnasal drip or a runny nose that instead notes that her sinus passages become completely blocked up. This happened even when she was not using the CPAP machine. She has been seeing her otolaryngologist for this but unfortunately the problem still continues.   Past Medical History  Diagnosis Date  . Osteoarthritis     knees/cervical and lumbar spine  . Hypertension   . Hyperlipidemia   . Hypothyroidism   . Anemia   . Depression   . Anxiety   . Nephrolithiasis   . Interstitial cystitis   . Pulmonary nodules     followed by Dr Raul Del  . Fatty liver   . Renal cyst     right  . Pulmonary hypertension     Review of Systems  Constitutional: Positive for fatigue. Negative for fever and chills.  HENT: Positive for congestion, rhinorrhea, postnasal drip and sinus pressure.  Respiratory: Negative for cough, shortness of breath and wheezing.   Cardiovascular: Positive for leg swelling. Negative for chest pain and palpitations.       Objective:   Physical Exam   Filed Vitals:   05/25/13 1200  BP: 140/80  Pulse: 67  Temp: 98.5 F (36.9 C)  TempSrc: Oral  Height: _0  (1.651 m)  Weight: 238 lb (107.956 kg)  SpO2: 94%  RA  Gen: obese, chronically ill appearing, no acute distress HEENT: NCAT,  OP clear PULM: CTA B CV: RRR, systolic murmur RUSB, no JVD AB: BS+, soft, nontender, no hsm Ext: warm,  pitting edema in legs, compression stockings in place, no clubbing, no cyanosis    03/2012 Full PFT >> Ratio 82%, FEV 1.45 L (82%), TLC 2.99L (72% pred) DLCO 53% pred 07/2011 LHC >> normal coronary anatomy 07/2011 RHC >> Wedge 21; PA mean 35, 2.99 woods units; CO 4.6 L/min, CI 2.3 L/min/m2 07/2011 TTE >> mild RV dilation, LVEF 40-45%  12/29/2012 EKG > poor study due to TENS unit, no clear ST wave changes     Assessment & Plan:   OSA (obstructive sleep apnea) I am concerned that Kimberly Roy has been experiencing increasing nasal congestion because she has been receiving inadequate humidity from her CPAP machine. We're going to ask her home medical supply are to check the machine. It is also unclear to me if she is receiving the appropriate pressure as her husband reports seeing a number on the machine which is markedly lower than what her setting should be.  She has been struggling with claustrophobia but is making progress by using the machine during the daytime.  Plan: -Once she is using the machine again for 3 weeks in a row she will call us for a download so we can see what pressure she needs -We will ask her DME company to check the humidity on the machine and increase it if possible -Followup with me in 6 months.    Updated Medication List Outpatient Encounter Prescriptions as of 05/25/2013  Medication Sig Dispense Refill  . alendronate (FOSAMAX) 70 MG tablet Take 70 mg by mouth every 7 (seven) days.       Marland Kitchen amitriptyline (ELAVIL) 25 MG tablet Take 1 tablet (25 mg total) by mouth at bedtime.  30 tablet  5  . amLODipine (NORVASC) 10 MG tablet Take 1 tablet (10 mg total) by mouth daily.  90 tablet  1  . atorvastatin (LIPITOR) 20 MG tablet Take one tablet by mouth daily at bedtime  90 tablet  1  . benazepril (LOTENSIN) 40 MG tablet TAKE ONE (1) TABLET EACH DAY  90 tablet  1  . Calcium-Magnesium-Vitamin D 600-40-500 MG-MG-UNIT TB24 Take 630 mg by mouth 4 (four) times daily.      [name] Sodium 30-100 MG CAPS Take by mouth.      . cephALEXin (KEFLEX) 500 MG capsule Take 1 capsule (500 mg total) by mouth 3 (three) times daily.  21 capsule  0  . fluticasone (FLONASE) 50 MCG/ACT nasal spray Place 2 sprays into the nose daily.      . furosemide (LASIX) 20 MG tablet Take 1 tablet (20 mg total) by mouth daily as needed.  30 tablet  5  . gabapentin (NEURONTIN) 300 MG capsule Take 300 mg by mouth 3 (three) times daily.      . hydrochlorothiazide (HYDRODIURIL) 25 MG tablet Take 1 tablet (25 mg total) by mouth daily.  90 tablet  1  .  HYDROcodone-acetaminophen (NORCO) 10-325 MG per tablet Take 1 tablet by mouth every 4 (four) hours as needed.      . metoprolol succinate (TOPROL-XL) 100 MG 24 hr tablet Take 1 tablet (100 mg total) by mouth 2 (two) times daily. Take with or immediately following a meal.  60 tablet  5  . morphine (MS CONTIN) 100 MG 12 hr tablet Take 100 mg by mouth 3 (three) times daily.      . Multiple Vitamins-Minerals (CENTRUM SILVER ADULT 50+ PO) Take by mouth daily.      Marland Kitchen nystatin cream (MYCOSTATIN) Apply topically 2 (two) times daily.  30 g  1  . pantoprazole (PROTONIX) 40 MG injection Take 40 mg by mouth 2 (two) times daily.       . polyethylene glycol powder (GLYCOLAX/MIRALAX) powder MIX 17GM (AS MARKED IN BOTTLE TOP) IN 8 OUNCES OF WATER, MIX AND DRINK ONCE A DAY AS DIRECTED.  527 g  11  . sodium chloride (OCEAN) 0.65 % nasal spray Place 1 spray into the nose as needed.      Marland Kitchen SYNTHROID 112 MCG tablet TAKE ONE (1) TABLET EACH DAY  90 tablet  1   No facility-administered encounter medications on file as of 05/25/2013.

## 2013-06-17 ENCOUNTER — Emergency Department: Payer: Self-pay | Admitting: Emergency Medicine

## 2013-07-01 ENCOUNTER — Other Ambulatory Visit: Payer: Self-pay | Admitting: Internal Medicine

## 2013-07-03 ENCOUNTER — Encounter: Payer: Self-pay | Admitting: Internal Medicine

## 2013-07-03 DIAGNOSIS — Z1239 Encounter for other screening for malignant neoplasm of breast: Secondary | ICD-10-CM

## 2013-07-05 ENCOUNTER — Other Ambulatory Visit: Payer: Self-pay | Admitting: Internal Medicine

## 2013-07-05 MED ORDER — NYSTATIN 100000 UNIT/GM EX CREA: TOPICAL_CREAM | Freq: Two times a day (BID) | CUTANEOUS | Status: DC

## 2013-07-05 NOTE — Telephone Encounter (Signed)
Refilled nystatin x 2.  Placed order for mammo.  Messaged pt.

## 2013-07-15 ENCOUNTER — Encounter: Payer: Self-pay | Admitting: Internal Medicine

## 2013-07-15 ENCOUNTER — Other Ambulatory Visit: Payer: Self-pay | Admitting: Internal Medicine

## 2013-08-04 ENCOUNTER — Ambulatory Visit: Payer: Self-pay | Admitting: Internal Medicine

## 2013-08-09 ENCOUNTER — Other Ambulatory Visit: Payer: Self-pay | Admitting: *Deleted

## 2013-08-09 ENCOUNTER — Encounter: Payer: Self-pay | Admitting: Internal Medicine

## 2013-08-10 ENCOUNTER — Other Ambulatory Visit: Payer: Self-pay | Admitting: Internal Medicine

## 2013-08-10 ENCOUNTER — Encounter: Payer: Self-pay | Admitting: Internal Medicine

## 2013-08-10 ENCOUNTER — Ambulatory Visit (INDEPENDENT_AMBULATORY_CARE_PROVIDER_SITE_OTHER): Payer: Medicare Other | Admitting: Internal Medicine

## 2013-08-10 VITALS — BP 138/70 | HR 70 | Temp 98.1°F | Ht 65.0 in | Wt 234.8 lb

## 2013-08-10 DIAGNOSIS — I10 Essential (primary) hypertension: Secondary | ICD-10-CM

## 2013-08-10 DIAGNOSIS — D649 Anemia, unspecified: Secondary | ICD-10-CM

## 2013-08-10 DIAGNOSIS — M7989 Other specified soft tissue disorders: Secondary | ICD-10-CM

## 2013-08-10 DIAGNOSIS — E039 Hypothyroidism, unspecified: Secondary | ICD-10-CM

## 2013-08-10 DIAGNOSIS — R918 Other nonspecific abnormal finding of lung field: Secondary | ICD-10-CM

## 2013-08-10 DIAGNOSIS — E7800 Pure hypercholesterolemia, unspecified: Secondary | ICD-10-CM

## 2013-08-10 DIAGNOSIS — R490 Dysphonia: Secondary | ICD-10-CM

## 2013-08-10 DIAGNOSIS — I272 Pulmonary hypertension, unspecified: Secondary | ICD-10-CM

## 2013-08-10 DIAGNOSIS — M1990 Unspecified osteoarthritis, unspecified site: Secondary | ICD-10-CM

## 2013-08-10 DIAGNOSIS — K5909 Other constipation: Secondary | ICD-10-CM

## 2013-08-10 DIAGNOSIS — K5900 Constipation, unspecified: Secondary | ICD-10-CM

## 2013-08-10 DIAGNOSIS — G4733 Obstructive sleep apnea (adult) (pediatric): Secondary | ICD-10-CM

## 2013-08-10 DIAGNOSIS — I2789 Other specified pulmonary heart diseases: Secondary | ICD-10-CM

## 2013-08-10 LAB — HEPATIC FUNCTION PANEL
ALT: 15 U/L (ref 0–35)
Albumin: 4 g/dL (ref 3.5–5.2)
Bilirubin, Direct: 0.1 mg/dL (ref 0.0–0.3)
Total Protein: 7 g/dL (ref 6.0–8.3)

## 2013-08-10 LAB — CBC WITH DIFFERENTIAL/PLATELET
Basophils Relative: 0.4 % (ref 0.0–3.0)
Eosinophils Absolute: 0.2 10*3/uL (ref 0.0–0.7)
Hemoglobin: 11 g/dL — ABNORMAL LOW (ref 12.0–15.0)
Lymphocytes Relative: 24 % (ref 12.0–46.0)
MCHC: 33 g/dL (ref 30.0–36.0)
MCV: 95.1 fl (ref 78.0–100.0)
Monocytes Absolute: 0.6 10*3/uL (ref 0.1–1.0)
Monocytes Relative: 7.4 % (ref 3.0–12.0)
Neutrophils Relative %: 65.7 % (ref 43.0–77.0)
RBC: 3.5 Mil/uL — ABNORMAL LOW (ref 3.87–5.11)
WBC: 8.2 10*3/uL (ref 4.5–10.5)

## 2013-08-10 LAB — LIPID PANEL
Cholesterol: 124 mg/dL (ref 0–200)
HDL: 33.7 mg/dL — ABNORMAL LOW (ref >39.00)
LDL Cholesterol: 68 mg/dL (ref 0–99)
Triglycerides: 114 mg/dL (ref 0.0–149.0)
VLDL: 22.8 mg/dL (ref 0.0–40.0)

## 2013-08-10 LAB — FERRITIN: Ferritin: 91.4 ng/mL (ref 10.0–291.0)

## 2013-08-10 LAB — TSH: TSH: 3.33 u[IU]/mL (ref 0.35–5.50)

## 2013-08-10 LAB — BASIC METABOLIC PANEL
GFR: 62.36 mL/min (ref >60.00)
Glucose, Bld: 106 mg/dL — ABNORMAL HIGH (ref 70–99)
Potassium: 4.3 mEq/L (ref 3.5–5.1)
Sodium: 135 mEq/L (ref 135–145)

## 2013-08-10 NOTE — Progress Notes (Signed)
Pre-visit discussion using our clinic review tool. No additional management support is needed unless otherwise documented below in the visit note.  

## 2013-08-10 NOTE — Progress Notes (Signed)
Orders placed for f/u labs.  

## 2013-08-13 ENCOUNTER — Telehealth: Payer: Self-pay | Admitting: Internal Medicine

## 2013-08-13 ENCOUNTER — Encounter: Payer: Self-pay | Admitting: Internal Medicine

## 2013-08-13 DIAGNOSIS — R739 Hyperglycemia, unspecified: Secondary | ICD-10-CM

## 2013-08-13 DIAGNOSIS — R748 Abnormal levels of other serum enzymes: Secondary | ICD-10-CM

## 2013-08-13 NOTE — Telephone Encounter (Signed)
Pt notified that results were negative (should also receive a letter from the imaging center)

## 2013-08-13 NOTE — Telephone Encounter (Signed)
Pt notified of lab results via my chart.  Needs a fasting lab appointment within the next few weeks.  Please schedule and contact her with a lab appointment date and time.  Thanks

## 2013-08-13 NOTE — Telephone Encounter (Signed)
The patient is wanting her mammogram results today.

## 2013-08-14 ENCOUNTER — Encounter: Payer: Self-pay | Admitting: Internal Medicine

## 2013-08-14 DIAGNOSIS — R490 Dysphonia: Secondary | ICD-10-CM | POA: Insufficient documentation

## 2013-08-14 NOTE — Assessment & Plan Note (Signed)
Blood pressure under reasonable control on current medication regimen.  Follow.      

## 2013-08-14 NOTE — Assessment & Plan Note (Signed)
On synthroid.  Follow tsh.   

## 2013-08-14 NOTE — Assessment & Plan Note (Signed)
Persistent hoarseness.  Some congestion in her throat.  See symptoms as outlined.  Taking an antihistamine regularly.  On PPI.  Reflux controlled.  Plans to follow up with Dr Kathyrn Sheriff.

## 2013-08-14 NOTE — Assessment & Plan Note (Signed)
Followed by Dr Lake Bells.  Had sleep study.  CPAP.

## 2013-08-14 NOTE — Assessment & Plan Note (Signed)
Let swelling improved.  Had unna boots.  Now wearing compression hose.  Better.  Follow.      

## 2013-08-14 NOTE — Assessment & Plan Note (Signed)
Persistent problem for her.  Sees GI.  Takes chronic narcotic pain meds. Stay hydrated.  Continue follow up with GI.  Currently stable.

## 2013-08-14 NOTE — Assessment & Plan Note (Signed)
On Lipitor.  Low cholesterol diet and exercise.  Follow lipid proflie and liver panel

## 2013-08-14 NOTE — Assessment & Plan Note (Signed)
Nodules have been stable.  No further follow up CT warranted.

## 2013-08-14 NOTE — Progress Notes (Signed)
Subjective:    Patient ID: Kimberly Roy, female    DOB: Apr 22, 1931, 79 y.o.   MRN: [id number]  HPI 79 year old female with past history of hypertension, hypercholesterolemia, hypothyroidism, anemia, chronic constipation and pulmonary hypertension (new diagnosis).  She comes in today for a scheduled follow up.   Accompanied by her husband.  History obtained from both of them.  Breathing is stable.  No chest pain or tightness.  No nausea or vomiting.  Wearing her compression stockings.  Leg swelling is better.  Her main complaint is that of persistent hoarseness.  Notices more in the am.  Feels as if something is clogged in her throat.  Gets better through the day.  Overall otherwise things are stable. Appears to be doing better.     Past Medical History  Diagnosis Date  . Osteoarthritis     knees/cervical and lumbar spine  . Hypertension   . Hyperlipidemia   . Hypothyroidism   . Anemia   . Depression   . Anxiety   . Nephrolithiasis   . Interstitial cystitis   . Pulmonary nodules     followed by Dr Raul Del  . Fatty liver   . Renal cyst     right  . Pulmonary hypertension     Current Outpatient Prescriptions on File Prior to Visit  Medication Sig Dispense Refill  . alendronate (FOSAMAX) 70 MG tablet Take 70 mg by mouth every 7 (seven) days.       Marland Kitchen amitriptyline (ELAVIL) 25 MG tablet Take 1 tablet (25 mg total) by mouth at bedtime.  30 tablet  5  . amLODipine (NORVASC) 10 MG tablet TAKE ONE (1) TABLET EACH DAY  90 tablet  5  . atorvastatin (LIPITOR) 20 MG tablet TAKE ONE TABLET AT BEDTIME  90 tablet  1  . benazepril (LOTENSIN) 40 MG tablet TAKE ONE (1) TABLET EACH DAY  90 tablet  1  . Calcium-Magnesium-Vitamin D 600-40-500 MG-MG-UNIT TB24 Take 630 mg by mouth 4 (four) times daily.      [name] Sodium 30-100 MG CAPS Take by mouth.      . cephALEXin (KEFLEX) 500 MG capsule Take 1 capsule (500 mg total) by mouth 3 (three) times daily.  21 capsule  0  . fluticasone  (FLONASE) 50 MCG/ACT nasal spray Place 2 sprays into the nose daily.      . furosemide (LASIX) 20 MG tablet Take 1 tablet (20 mg total) by mouth daily as needed.  30 tablet  5  . gabapentin (NEURONTIN) 300 MG capsule Take 300 mg by mouth 3 (three) times daily.      . hydrochlorothiazide (HYDRODIURIL) 25 MG tablet Take 1 tablet (25 mg total) by mouth daily.  90 tablet  1  . HYDROcodone-acetaminophen (NORCO) 10-325 MG per tablet Take 1 tablet by mouth every 4 (four) hours as needed.      . metoprolol succinate (TOPROL-XL) 100 MG 24 hr tablet TAKE ONE TABLET TWICE DAILY. TAKE WITH OR IMMEDIATELY FOLLOWING A MEAL  60 tablet  5  . morphine (MS CONTIN) 100 MG 12 hr tablet Take 100 mg by mouth 3 (three) times daily.      . Multiple Vitamins-Minerals (CENTRUM SILVER ADULT 50+ PO) Take by mouth daily.      Marland Kitchen nystatin cream (MYCOSTATIN) Apply topically 2 (two) times daily.  30 g  1  . pantoprazole (PROTONIX) 40 MG injection Take 40 mg by mouth 2 (two) times daily.       Marland Kitchen  polyethylene glycol powder (GLYCOLAX/MIRALAX) powder MIX 17GM (AS MARKED IN BOTTLE TOP) IN 8 OUNCES OF WATER, MIX AND DRINK ONCE A DAY AS DIRECTED.  527 g  11  . sodium chloride (OCEAN) 0.65 % nasal spray Place 1 spray into the nose as needed.      Marland Kitchen SYNTHROID 112 MCG tablet TAKE ONE (1) TABLET EACH DAY  90 tablet  2   No current facility-administered medications on file prior to visit.    Review of Systems Patient denies any headache, lightheadedness or dizziness.  No chest pain, tightness or palpitations. Breathing is stable.  No significant increased cough.   No nausea or vomiting.  No abdominal pain or cramping currently.  No BRBPR or melana.   No urine change.  Swelling better.  Hoarseness as outlined.  Some congestion in her throat as outlined.       Objective:   Physical Exam  Filed Vitals:   08/10/13 0901  BP: 138/70  Pulse: 70  Temp: 98.1 F (51.43 C)   79 year old female in no acute distress. HEENT:  Nares clear.   Oropharynx without lesions.    NECK:  Supple, nontender.  No audible bruit.   HEART:  Appears to be regular. LUNGS:  Without crackles or wheezing audible.  Respirations even and unlabored.   RADIAL PULSE:  Equal bilaterally.  ABDOMEN:  Soft, nontender.    EXTREMITIES:  Support hose in place.  Swelling better.   DP pulses palpable and equal bilaterally.         Assessment & Plan:  MSK.  Has chronic pain in her back.  Stable.    CARDIOVASCULAR.  Sees Dr Saralyn Pilar.  Currently symptoms stable.  Continue risk factor modification.    QUESTIONABLE NEUROPATHY.  On gabapentin and this has helped the pain.      HEALTH MAINTENANCE.  Physical 11/30/12.  Being followed by GI.  Pap 04/05/09 within normal limits.  Mammogram 07/27/12 -  Birads II.  Due f/u mammogram.

## 2013-08-14 NOTE — Assessment & Plan Note (Signed)
Follow cbc and ferritin.  Last hgb stable.

## 2013-08-14 NOTE — Assessment & Plan Note (Signed)
Continue follow up with ortho and pain clinic.  Follow.     

## 2013-08-16 NOTE — Telephone Encounter (Signed)
Mailed unread message to pt  

## 2013-08-18 NOTE — Telephone Encounter (Signed)
Appointment 12/23 pt aware

## 2013-08-24 ENCOUNTER — Other Ambulatory Visit: Payer: Medicare Other

## 2013-08-31 ENCOUNTER — Other Ambulatory Visit: Payer: Self-pay | Admitting: Internal Medicine

## 2013-09-02 ENCOUNTER — Encounter: Payer: Self-pay | Admitting: Internal Medicine

## 2013-09-08 ENCOUNTER — Encounter: Payer: Self-pay | Admitting: Internal Medicine

## 2013-09-14 ENCOUNTER — Other Ambulatory Visit: Payer: Medicare Other

## 2013-09-16 ENCOUNTER — Other Ambulatory Visit (INDEPENDENT_AMBULATORY_CARE_PROVIDER_SITE_OTHER): Payer: Medicare Other

## 2013-09-16 DIAGNOSIS — R739 Hyperglycemia, unspecified: Secondary | ICD-10-CM

## 2013-09-16 DIAGNOSIS — R7309 Other abnormal glucose: Secondary | ICD-10-CM

## 2013-09-16 DIAGNOSIS — R748 Abnormal levels of other serum enzymes: Secondary | ICD-10-CM

## 2013-09-16 LAB — ALKALINE PHOSPHATASE: Alkaline Phosphatase: 111 U/L (ref 39–117)

## 2013-09-16 LAB — HEMOGLOBIN A1C: HEMOGLOBIN A1C: 6.1 % (ref 4.6–6.5)

## 2013-09-17 ENCOUNTER — Encounter: Payer: Self-pay | Admitting: Internal Medicine

## 2013-09-17 LAB — GLUCOSE, FASTING: GLUCOSE, FASTING: 110 mg/dL — AB (ref 70–99)

## 2013-09-24 ENCOUNTER — Telehealth: Payer: Self-pay | Admitting: Internal Medicine

## 2013-09-24 NOTE — Telephone Encounter (Signed)
I can see her on 10/06/13 - at 11:30.

## 2013-09-24 NOTE — Telephone Encounter (Signed)
Pt needs appt for orthopedic surgical clearance.  Next available 30 min slot March.  Pt hoping to have her surgery in March.  Please advise.

## 2013-09-27 ENCOUNTER — Telehealth: Payer: Self-pay | Admitting: Internal Medicine

## 2013-09-27 ENCOUNTER — Encounter: Payer: Self-pay | Admitting: Internal Medicine

## 2013-09-27 NOTE — Telephone Encounter (Signed)
Pt called and was wanting to know if her husband could bring in a specimen. I told her that we have to schedule appointments and she wanted to ask a nurse to see. I told her i would send a message back but she would have to make appointment more than likely.

## 2013-09-27 NOTE — Telephone Encounter (Signed)
Yes, unable to call in abx.  Will need appt for evaluation to see if infection present.  Thanks.

## 2013-09-27 NOTE — Telephone Encounter (Signed)
Left detailed message on voicemail.  

## 2013-09-27 NOTE — Telephone Encounter (Signed)
Please advise

## 2013-09-28 NOTE — Telephone Encounter (Signed)
That time is blocked for thrive proficiency traning  Do you want me still make appointment

## 2013-09-28 NOTE — Telephone Encounter (Signed)
No, thank you for catching this.  Please put her on 10/07/13 at 9:00 and block 30 minutes.  Thanks.

## 2013-09-29 NOTE — Telephone Encounter (Signed)
Appointment 2/5 pt aware

## 2013-10-07 ENCOUNTER — Ambulatory Visit (INDEPENDENT_AMBULATORY_CARE_PROVIDER_SITE_OTHER): Payer: Medicare Other | Admitting: Internal Medicine

## 2013-10-07 ENCOUNTER — Encounter: Payer: Self-pay | Admitting: Internal Medicine

## 2013-10-07 VITALS — BP 120/50 | HR 63 | Temp 98.7°F | Ht 65.0 in | Wt 230.0 lb

## 2013-10-07 DIAGNOSIS — D649 Anemia, unspecified: Secondary | ICD-10-CM

## 2013-10-07 DIAGNOSIS — E7800 Pure hypercholesterolemia, unspecified: Secondary | ICD-10-CM

## 2013-10-07 DIAGNOSIS — R918 Other nonspecific abnormal finding of lung field: Secondary | ICD-10-CM

## 2013-10-07 DIAGNOSIS — G4733 Obstructive sleep apnea (adult) (pediatric): Secondary | ICD-10-CM

## 2013-10-07 DIAGNOSIS — M1990 Unspecified osteoarthritis, unspecified site: Secondary | ICD-10-CM

## 2013-10-07 DIAGNOSIS — M7989 Other specified soft tissue disorders: Secondary | ICD-10-CM

## 2013-10-07 DIAGNOSIS — I272 Pulmonary hypertension, unspecified: Secondary | ICD-10-CM

## 2013-10-07 DIAGNOSIS — I2789 Other specified pulmonary heart diseases: Secondary | ICD-10-CM

## 2013-10-07 DIAGNOSIS — E039 Hypothyroidism, unspecified: Secondary | ICD-10-CM

## 2013-10-07 DIAGNOSIS — I10 Essential (primary) hypertension: Secondary | ICD-10-CM

## 2013-10-07 DIAGNOSIS — Z01818 Encounter for other preprocedural examination: Secondary | ICD-10-CM

## 2013-10-07 NOTE — Progress Notes (Signed)
Pre-visit discussion using our clinic review tool. No additional management support is needed unless otherwise documented below in the visit note.

## 2013-10-12 ENCOUNTER — Encounter: Payer: Self-pay | Admitting: Internal Medicine

## 2013-10-12 DIAGNOSIS — Z01818 Encounter for other preprocedural examination: Secondary | ICD-10-CM | POA: Insufficient documentation

## 2013-10-12 NOTE — Assessment & Plan Note (Signed)
Continue follow up with ortho and pain clinic.  Follow.  Planning for surgery.  Seeing Dr Marry Guan.

## 2013-10-12 NOTE — Assessment & Plan Note (Signed)
Nodules have been stable.  No further follow up CT warranted.    

## 2013-10-12 NOTE — Assessment & Plan Note (Signed)
On Lipitor.  Low cholesterol diet and exercise.  Follow lipid proflie and liver panel

## 2013-10-12 NOTE — Assessment & Plan Note (Signed)
On synthroid.  Follow tsh.   

## 2013-10-12 NOTE — Assessment & Plan Note (Signed)
Needs to use CPAP regularly.    

## 2013-10-12 NOTE — Assessment & Plan Note (Signed)
Has seen Dr Saralyn Pilar.  Per pt - felt to be at low risk from a cardiac standpoint to proceed with surgery.  Will need close intra op and post op monitoring of heart rate and blood pressure to avoid extremes.  Breathing better.

## 2013-10-12 NOTE — Progress Notes (Signed)
Subjective:    Patient ID: Kimberly Roy, female    DOB: Sep 17, 1930, 80 y.o.   MRN: [id number]  HPI 80 year old female with past history of hypertension, hypercholesterolemia, hypothyroidism, anemia, chronic constipation and pulmonary hypertension (new diagnosis).  She comes in today for pre op evaluation.   Accompanied by her husband.  History obtained from both of them.  Breathing is stable. She actually feels it is better.   No chest pain or tightness.  No nausea or vomiting.  Wearing her compression stockings.  Leg swelling is better. Is planning to have surgery.  Dr Marry Guan to perform.  Has f/u with him 10/20/13.  Saw Dr Saralyn Pilar.  States he feels she is at low risk from a cardiac standpoint - to proceed with surgery.  Refer to Dr Saralyn Pilar note for details.  Bowels stable.  Blood pressure doing well.  No urinary symptoms now.     Past Medical History  Diagnosis Date  . Osteoarthritis     knees/cervical and lumbar spine  . Hypertension   . Hyperlipidemia   . Hypothyroidism   . Anemia   . Depression   . Anxiety   . Nephrolithiasis   . Interstitial cystitis   . Pulmonary nodules     followed by Dr Raul Del  . Fatty liver   . Renal cyst     right  . Pulmonary hypertension     Current Outpatient Prescriptions on File Prior to Visit  Medication Sig Dispense Refill  . alendronate (FOSAMAX) 70 MG tablet Take 70 mg by mouth every 7 (seven) days.       Marland Kitchen amitriptyline (ELAVIL) 25 MG tablet Take 1 tablet (25 mg total) by mouth at bedtime.  30 tablet  5  . amLODipine (NORVASC) 10 MG tablet TAKE ONE (1) TABLET EACH DAY  90 tablet  5  . atorvastatin (LIPITOR) 20 MG tablet TAKE ONE TABLET AT BEDTIME  90 tablet  1  . benazepril (LOTENSIN) 40 MG tablet TAKE ONE (1) TABLET EACH DAY  90 tablet  1  . Calcium-Magnesium-Vitamin D 600-40-500 MG-MG-UNIT TB24 Take 630 mg by mouth 4 (four) times daily.      [name] Sodium 30-100 MG CAPS Take by mouth.      . cephALEXin (KEFLEX) 500 MG  capsule Take 1 capsule (500 mg total) by mouth 3 (three) times daily.  21 capsule  0  . fluticasone (FLONASE) 50 MCG/ACT nasal spray Place 2 sprays into the nose daily.      . furosemide (LASIX) 20 MG tablet Take 1 tablet (20 mg total) by mouth daily as needed.  30 tablet  5  . gabapentin (NEURONTIN) 300 MG capsule Take 300 mg by mouth 3 (three) times daily.      . hydrochlorothiazide (HYDRODIURIL) 25 MG tablet TAKE ONE (1) TABLET EACH DAY  90 tablet  1  . HYDROcodone-acetaminophen (NORCO) 10-325 MG per tablet Take 1 tablet by mouth every 4 (four) hours as needed.      . metoprolol succinate (TOPROL-XL) 100 MG 24 hr tablet TAKE ONE TABLET TWICE DAILY. TAKE WITH OR IMMEDIATELY FOLLOWING A MEAL  60 tablet  5  . morphine (MS CONTIN) 100 MG 12 hr tablet Take 100 mg by mouth 3 (three) times daily.      . Multiple Vitamins-Minerals (CENTRUM SILVER ADULT 50+ PO) Take by mouth daily.      Marland Kitchen nystatin cream (MYCOSTATIN) Apply topically 2 (two) times daily.  30 g  1  . pantoprazole (  PROTONIX) 40 MG injection Take 40 mg by mouth 2 (two) times daily.       . polyethylene glycol powder (GLYCOLAX/MIRALAX) powder MIX 17GM (AS MARKED IN BOTTLE TOP) IN 8 OUNCES OF WATER, MIX AND DRINK ONCE A DAY AS DIRECTED.  527 g  11  . sodium chloride (OCEAN) 0.65 % nasal spray Place 1 spray into the nose as needed.      Marland Kitchen SYNTHROID 112 MCG tablet TAKE ONE (1) TABLET EACH DAY  90 tablet  2   No current facility-administered medications on file prior to visit.    Review of Systems Patient denies any headache, lightheadedness or dizziness.  No chest pain, tightness or palpitations. Breathing is stable.  Reports is better.  No cough or congestion.  No nausea or vomiting.  No abdominal pain or cramping.  No BRBPR or melana.   No urine change.  Swelling better.  Saw Dr Saralyn Pilar recently.        Objective:   Physical Exam  Filed Vitals:   10/07/13 0850  BP: 120/50  Pulse: 63  Temp: 98.7 F (37.1 C)   Blood pressure  recheck:  30/52  80 year old female in no acute distress. HEENT:  Nares clear.  Oropharynx without lesions.    NECK:  Supple, nontender.  No audible bruit.   HEART:  Appears to be regular. LUNGS:  Without crackles or wheezing audible.  Respirations even and unlabored.   RADIAL PULSE:  Equal bilaterally.  ABDOMEN:  Soft, nontender.    EXTREMITIES:  Support hose in place.  Swelling better.   DP pulses palpable and equal bilaterally.         Assessment & Plan:  MSK.  Has chronic pain in her back.  Stable.    CARDIOVASCULAR.  Sees Dr Saralyn Pilar.  Currently doing well.  Per pt, she recently saw Dr Saralyn Pilar and he feels she is at low risk from a cardiac standpoint to proceed with surgery.     QUESTIONABLE NEUROPATHY.  On gabapentin and this has helped the pain.      HEALTH MAINTENANCE.  Physical 11/30/12.  Being followed by GI.  Pap 04/05/09 within normal limits.  Mammogram 08/04/13 -  Birads I.

## 2013-10-12 NOTE — Assessment & Plan Note (Signed)
Let swelling improved.  Had unna boots.  Now wearing compression hose.  Better.  Follow.

## 2013-10-12 NOTE — Assessment & Plan Note (Signed)
Followed by Dr Lake Bells.  Had sleep study.  CPAP.

## 2013-10-12 NOTE — Assessment & Plan Note (Signed)
Blood pressure under control on current medication regimen.  Follow.   

## 2013-10-12 NOTE — Assessment & Plan Note (Signed)
Follow cbc and ferritin.  Last hgb stable.  Will need pre op cbc to assess hgb.

## 2013-10-21 ENCOUNTER — Other Ambulatory Visit: Payer: Self-pay | Admitting: Internal Medicine

## 2013-11-30 ENCOUNTER — Encounter: Payer: Medicare Other | Admitting: Internal Medicine

## 2013-12-01 ENCOUNTER — Encounter: Payer: Medicare Other | Admitting: Internal Medicine

## 2013-12-08 ENCOUNTER — Ambulatory Visit: Payer: Self-pay | Admitting: General Practice

## 2013-12-08 LAB — URINALYSIS, COMPLETE
BILIRUBIN, UR: NEGATIVE
BLOOD: NEGATIVE
Bacteria: NONE SEEN
GLUCOSE, UR: NEGATIVE mg/dL (ref 0–75)
Ketone: NEGATIVE
Leukocyte Esterase: NEGATIVE
NITRITE: NEGATIVE
PROTEIN: NEGATIVE
Ph: 7 (ref 4.5–8.0)
RBC,UR: <1 /HPF (ref 0–5)
SQUAMOUS EPITHELIAL: NONE SEEN
Specific Gravity: 1.009 (ref 1.003–1.030)
WBC UR: <1 /HPF (ref 0–5)

## 2013-12-08 LAB — CBC
HCT: 31.5 % — ABNORMAL LOW (ref 35.0–47.0)
HGB: 10.6 g/dL — ABNORMAL LOW (ref 12.0–16.0)
MCH: 32.2 pg (ref 26.0–34.0)
MCHC: 33.5 g/dL (ref 32.0–36.0)
MCV: 96 fL (ref 80–100)
Platelet: 164 10*3/uL (ref 150–440)
RBC: 3.28 10*6/uL — ABNORMAL LOW (ref 3.80–5.20)
RDW: 13.4 % (ref 11.5–14.5)
WBC: 6.5 10*3/uL (ref 3.6–11.0)

## 2013-12-08 LAB — BASIC METABOLIC PANEL
Anion Gap: 6 — ABNORMAL LOW (ref 7–16)
BUN: 18 mg/dL (ref 7–18)
CALCIUM: 8.9 mg/dL (ref 8.5–10.1)
Chloride: 101 mmol/L (ref 98–107)
Co2: 32 mmol/L (ref 21–32)
Creatinine: 1.08 mg/dL (ref 0.60–1.30)
EGFR (African American): 56 — ABNORMAL LOW
EGFR (Non-African Amer.): 48 — ABNORMAL LOW
Glucose: 93 mg/dL (ref 65–99)
OSMOLALITY: 279 (ref 275–301)
Potassium: 3.6 mmol/L (ref 3.5–5.1)
SODIUM: 139 mmol/L (ref 136–145)

## 2013-12-08 LAB — APTT: Activated PTT: 30.1 secs (ref 23.6–35.9)

## 2013-12-08 LAB — SEDIMENTATION RATE: Erythrocyte Sed Rate: 72 mm/hr — ABNORMAL HIGH (ref 0–30)

## 2013-12-08 LAB — PROTIME-INR
INR: 1
Prothrombin Time: 13.4 s

## 2013-12-08 LAB — MRSA PCR SCREENING

## 2013-12-09 LAB — URINE CULTURE

## 2013-12-21 DIAGNOSIS — IMO0002 Reserved for concepts with insufficient information to code with codable children: Secondary | ICD-10-CM | POA: Insufficient documentation

## 2013-12-22 ENCOUNTER — Inpatient Hospital Stay: Payer: Self-pay | Admitting: General Practice

## 2013-12-23 LAB — BASIC METABOLIC PANEL
Anion Gap: 4 — ABNORMAL LOW (ref 7–16)
BUN: 16 mg/dL (ref 7–18)
CHLORIDE: 103 mmol/L (ref 98–107)
Calcium, Total: 7.8 mg/dL — ABNORMAL LOW (ref 8.5–10.1)
Co2: 29 mmol/L (ref 21–32)
Creatinine: 0.94 mg/dL (ref 0.60–1.30)
EGFR (African American): >60
EGFR (Non-African Amer.): 57 — ABNORMAL LOW
Glucose: 131 mg/dL — ABNORMAL HIGH (ref 65–99)
OSMOLALITY: 275 (ref 275–301)
Potassium: 4.5 mmol/L (ref 3.5–5.1)
SODIUM: 136 mmol/L (ref 136–145)

## 2013-12-23 LAB — HEMOGLOBIN: HGB: 8.9 g/dL — AB (ref 12.0–16.0)

## 2013-12-23 LAB — PLATELET COUNT: Platelet: 151 10*3/uL (ref 150–440)

## 2013-12-24 LAB — BASIC METABOLIC PANEL
ANION GAP: 4 — AB (ref 7–16)
BUN: 17 mg/dL (ref 7–18)
CALCIUM: 8.1 mg/dL — AB (ref 8.5–10.1)
CHLORIDE: 102 mmol/L (ref 98–107)
Co2: 33 mmol/L — ABNORMAL HIGH (ref 21–32)
Creatinine: 0.94 mg/dL (ref 0.60–1.30)
EGFR (African American): >60
GFR CALC NON AF AMER: 57 — AB
GLUCOSE: 109 mg/dL — AB (ref 65–99)
OSMOLALITY: 280 (ref 275–301)
Potassium: 4.3 mmol/L (ref 3.5–5.1)
Sodium: 139 mmol/L (ref 136–145)

## 2013-12-24 LAB — PLATELET COUNT: PLATELETS: 138 10*3/uL — AB (ref 150–440)

## 2013-12-24 LAB — HEMOGLOBIN: HGB: 8.4 g/dL — ABNORMAL LOW (ref 12.0–16.0)

## 2013-12-25 LAB — HEMOGLOBIN: HGB: 8 g/dL — ABNORMAL LOW (ref 12.0–16.0)

## 2013-12-26 ENCOUNTER — Encounter: Payer: Self-pay | Admitting: Internal Medicine

## 2013-12-31 ENCOUNTER — Ambulatory Visit: Payer: Self-pay | Admitting: Gerontology

## 2013-12-31 ENCOUNTER — Encounter: Payer: Self-pay | Admitting: Internal Medicine

## 2014-01-06 ENCOUNTER — Encounter: Payer: Self-pay | Admitting: General Practice

## 2014-01-13 ENCOUNTER — Telehealth: Payer: Self-pay | Admitting: Internal Medicine

## 2014-01-13 NOTE — Telephone Encounter (Signed)
The patient was at physical therapy this morning and the therapist notice that the patient was weak . She had a knee replacement on 4.22.15 . The therapist thinks she needs a CBC done . Please advise.

## 2014-01-13 NOTE — Telephone Encounter (Signed)
If weak with recent surgery, I do not mind her having a cbc checked, but I think she needs evaluation especially since I am not going to be here tomorrow or Monday.  I do not want to just order a lab - may need something more and I will not be here to f/u.  Needs appt for evaluation.  Thanks

## 2014-01-14 ENCOUNTER — Other Ambulatory Visit: Payer: Self-pay | Admitting: Internal Medicine

## 2014-01-14 NOTE — Telephone Encounter (Signed)
Pt's husband is in hospital, would need to schedule transportation. Offered appts with Raquel either today or Monday because Dr. Nicki Reaper is out of the office. Pt declined to schedule an appointment at this time until husband is released from hospital and she is able to have transportation, states will call back next week to schedule. She will call back if symptoms worsen.

## 2014-01-19 ENCOUNTER — Other Ambulatory Visit: Payer: Self-pay | Admitting: Internal Medicine

## 2014-01-31 ENCOUNTER — Encounter: Payer: Self-pay | Admitting: General Practice

## 2014-02-03 ENCOUNTER — Encounter: Payer: Self-pay | Admitting: Surgery

## 2014-02-03 ENCOUNTER — Telehealth: Payer: Self-pay | Admitting: Internal Medicine

## 2014-02-03 DIAGNOSIS — D649 Anemia, unspecified: Secondary | ICD-10-CM

## 2014-02-03 DIAGNOSIS — I10 Essential (primary) hypertension: Secondary | ICD-10-CM

## 2014-02-03 DIAGNOSIS — E7800 Pure hypercholesterolemia, unspecified: Secondary | ICD-10-CM

## 2014-02-03 DIAGNOSIS — E039 Hypothyroidism, unspecified: Secondary | ICD-10-CM

## 2014-02-03 NOTE — Telephone Encounter (Signed)
Notify pt that I received lab results from Dr [employer] office.  Her hgb is decreased.  She has a history of decreased hgb, but is slightly more decreased.  (overall relatively stable).  Need to check f/u labs within the next week.  Have her come in for fasting labs.

## 2014-02-04 NOTE — Telephone Encounter (Signed)
Pt notified & scheduled lab appt for: 02/11/14 (Fri) @ 9:15 AM. Also aware that she has a CPE appt on 02/23/14.

## 2014-02-07 ENCOUNTER — Other Ambulatory Visit: Payer: Medicare Other

## 2014-02-11 ENCOUNTER — Other Ambulatory Visit: Payer: Medicare Other

## 2014-02-21 ENCOUNTER — Other Ambulatory Visit (INDEPENDENT_AMBULATORY_CARE_PROVIDER_SITE_OTHER): Payer: Medicare Other

## 2014-02-21 DIAGNOSIS — E039 Hypothyroidism, unspecified: Secondary | ICD-10-CM

## 2014-02-21 DIAGNOSIS — E7800 Pure hypercholesterolemia, unspecified: Secondary | ICD-10-CM

## 2014-02-21 DIAGNOSIS — D649 Anemia, unspecified: Secondary | ICD-10-CM

## 2014-02-21 DIAGNOSIS — I10 Essential (primary) hypertension: Secondary | ICD-10-CM

## 2014-02-21 LAB — CBC WITH DIFFERENTIAL/PLATELET
Basophils Absolute: 0 10*3/uL (ref 0.0–0.1)
Basophils Relative: 0.3 % (ref 0.0–3.0)
Eosinophils Absolute: 0.3 10*3/uL (ref 0.0–0.7)
Eosinophils Relative: 4.3 % (ref 0.0–5.0)
HEMATOCRIT: 32.1 % — AB (ref 36.0–46.0)
HEMOGLOBIN: 10.4 g/dL — AB (ref 12.0–15.0)
Lymphocytes Relative: 35.5 % (ref 12.0–46.0)
Lymphs Abs: 2.8 10*3/uL (ref 0.7–4.0)
MCHC: 32.5 g/dL (ref 30.0–36.0)
MCV: 95 fl (ref 78.0–100.0)
MONOS PCT: 7.2 % (ref 3.0–12.0)
Monocytes Absolute: 0.6 10*3/uL (ref 0.1–1.0)
NEUTROS ABS: 4.2 10*3/uL (ref 1.4–7.7)
Neutrophils Relative %: 52.7 % (ref 43.0–77.0)
Platelets: 197 10*3/uL (ref 150.0–400.0)
RBC: 3.38 Mil/uL — ABNORMAL LOW (ref 3.87–5.11)
RDW: 15.1 % (ref 11.5–15.5)
WBC: 8 10*3/uL (ref 4.0–10.5)

## 2014-02-21 LAB — LIPID PANEL
Cholesterol: 129 mg/dL (ref 0–200)
HDL: 31.3 mg/dL — ABNORMAL LOW (ref >39.00)
LDL Cholesterol: 59 mg/dL (ref 0–99)
NonHDL: 97.7
Total CHOL/HDL Ratio: 4
Triglycerides: 194 mg/dL — ABNORMAL HIGH (ref 0.0–149.0)
VLDL: 38.8 mg/dL (ref 0.0–40.0)

## 2014-02-21 LAB — HEPATIC FUNCTION PANEL
ALT: 14 U/L (ref 0–35)
AST: 22 U/L (ref 0–37)
Albumin: 4.1 g/dL (ref 3.5–5.2)
Alkaline Phosphatase: 84 U/L (ref 39–117)
Bilirubin, Direct: 0.1 mg/dL (ref 0.0–0.3)
Total Bilirubin: 0.6 mg/dL (ref 0.2–1.2)
Total Protein: 6.8 g/dL (ref 6.0–8.3)

## 2014-02-21 LAB — BASIC METABOLIC PANEL
BUN: 17 mg/dL (ref 6–23)
CO2: 31 mEq/L (ref 19–32)
CREATININE: 0.9 mg/dL (ref 0.4–1.2)
Calcium: 9.3 mg/dL (ref 8.4–10.5)
Chloride: 100 mEq/L (ref 96–112)
GFR: 63.07 mL/min (ref >60.00)
Glucose, Bld: 93 mg/dL (ref 70–99)
Potassium: 4.3 mEq/L (ref 3.5–5.1)
Sodium: 141 mEq/L (ref 135–145)

## 2014-02-21 LAB — TSH: TSH: 1.99 u[IU]/mL (ref 0.35–4.50)

## 2014-02-21 LAB — VITAMIN B12: Vitamin B-12: >1500 pg/mL — ABNORMAL HIGH (ref 211–911)

## 2014-02-21 LAB — FERRITIN: Ferritin: 90.1 ng/mL (ref 10.0–291.0)

## 2014-02-21 LAB — IBC PANEL
IRON: 55 ug/dL (ref 42–145)
Saturation Ratios: 16.6 % — ABNORMAL LOW (ref 20.0–50.0)
TRANSFERRIN: 237.3 mg/dL (ref 212.0–360.0)

## 2014-02-23 ENCOUNTER — Ambulatory Visit (INDEPENDENT_AMBULATORY_CARE_PROVIDER_SITE_OTHER): Payer: Medicare Other | Admitting: Internal Medicine

## 2014-02-23 ENCOUNTER — Encounter: Payer: Self-pay | Admitting: Internal Medicine

## 2014-02-23 VITALS — BP 130/60 | HR 66 | Temp 98.3°F | Wt 223.5 lb

## 2014-02-23 DIAGNOSIS — D649 Anemia, unspecified: Secondary | ICD-10-CM

## 2014-02-23 DIAGNOSIS — M7989 Other specified soft tissue disorders: Secondary | ICD-10-CM

## 2014-02-23 DIAGNOSIS — E7800 Pure hypercholesterolemia, unspecified: Secondary | ICD-10-CM

## 2014-02-23 DIAGNOSIS — R5381 Other malaise: Secondary | ICD-10-CM

## 2014-02-23 DIAGNOSIS — I272 Pulmonary hypertension, unspecified: Secondary | ICD-10-CM

## 2014-02-23 DIAGNOSIS — I2789 Other specified pulmonary heart diseases: Secondary | ICD-10-CM

## 2014-02-23 DIAGNOSIS — G4733 Obstructive sleep apnea (adult) (pediatric): Secondary | ICD-10-CM

## 2014-02-23 DIAGNOSIS — R918 Other nonspecific abnormal finding of lung field: Secondary | ICD-10-CM

## 2014-02-23 DIAGNOSIS — E039 Hypothyroidism, unspecified: Secondary | ICD-10-CM

## 2014-02-23 DIAGNOSIS — Z1211 Encounter for screening for malignant neoplasm of colon: Secondary | ICD-10-CM

## 2014-02-23 DIAGNOSIS — E669 Obesity, unspecified: Secondary | ICD-10-CM

## 2014-02-23 DIAGNOSIS — R5383 Other fatigue: Secondary | ICD-10-CM

## 2014-02-23 DIAGNOSIS — I10 Essential (primary) hypertension: Secondary | ICD-10-CM

## 2014-02-23 NOTE — Progress Notes (Signed)
Pre visit review using our clinic review tool, if applicable. No additional management support is needed unless otherwise documented below in the visit note.

## 2014-02-24 ENCOUNTER — Encounter: Payer: Self-pay | Admitting: Internal Medicine

## 2014-02-25 ENCOUNTER — Encounter: Payer: Self-pay | Admitting: Internal Medicine

## 2014-02-27 ENCOUNTER — Encounter: Payer: Self-pay | Admitting: Internal Medicine

## 2014-02-27 DIAGNOSIS — E669 Obesity, unspecified: Secondary | ICD-10-CM | POA: Insufficient documentation

## 2014-02-27 DIAGNOSIS — R5383 Other fatigue: Secondary | ICD-10-CM | POA: Insufficient documentation

## 2014-02-27 NOTE — Assessment & Plan Note (Signed)
On synthroid.  Follow tsh.   

## 2014-02-27 NOTE — Assessment & Plan Note (Signed)
Discussed diet, exercise and weight loss.  Follow.

## 2014-02-27 NOTE — Assessment & Plan Note (Signed)
Needs to use CPAP regularly.    

## 2014-02-27 NOTE — Assessment & Plan Note (Signed)
Last hgb slightly more decreased.  Has seen GI previously.  Will refer to hematology for further evaluation and treatment.

## 2014-02-27 NOTE — Assessment & Plan Note (Signed)
On Lipitor.  Low cholesterol diet and exercise.  Follow lipid proflie and liver panel

## 2014-02-27 NOTE — Assessment & Plan Note (Signed)
Followed by Dr Lake Bells.  Had sleep study.  CPAP.

## 2014-02-27 NOTE — Assessment & Plan Note (Signed)
Blood pressure under control on current medication regimen.  Follow.   

## 2014-02-27 NOTE — Assessment & Plan Note (Signed)
Nodules have been stable.  No further follow up CT warranted.    

## 2014-02-27 NOTE — Assessment & Plan Note (Signed)
Continue follow up with ortho and pain clinic.  S/p surgery.  Doing better.  Seeing Dr Marry Guan.  Attempting to decrease pain medication.

## 2014-02-27 NOTE — Assessment & Plan Note (Signed)
Felt to be multifactorial.  Will refer for her anemia.  Continue physical therapy.  Continue CPAP.  Continue to decrease pain medications.

## 2014-02-27 NOTE — Assessment & Plan Note (Signed)
Let swelling overall improved.  Had unna boots.  Now wearing compression hose.  Follow.

## 2014-02-27 NOTE — Progress Notes (Signed)
Subjective:    Patient ID: Kimberly Roy, female    DOB: [birth year], 80 y.o.   MRN: [id number]  HPI 79 year old female with past history of hypertension, hypercholesterolemia, hypothyroidism, anemia, chronic constipation and pulmonary hypertension (new diagnosis).  She comes in today to follow up on these issues as well as for a complete physical exam.   Accompanied by her husband.  History obtained from both of them.  Breathing is stable. She actually feels it is better.   No chest pain or tightness.  No nausea or vomiting.  Wearing her compression stockings.  Leg swelling has been better.  Some increase recently.   Just had surgery.  Performed by Dr Marry Guan.  Doing well.  Working with physical therapy.   Bowels stable.  Blood pressure doing well.  No urinary symptoms now.  Fatigue.  Knees feel weak.  Pain is better.  She is trying to decrease her pain medication dosage.  Has had persistent decreased hemoglobin.  A little lower on this last check.  Has seen GI previously.  She has noticed some occasional acid reflux despite medication.  Not a frequent occurrence.     Past Medical History  Diagnosis Date  . Osteoarthritis     knees/cervical and lumbar spine  . Hypertension   . Hyperlipidemia   . Hypothyroidism   . Anemia   . Depression   . Anxiety   . Nephrolithiasis   . Interstitial cystitis   . Pulmonary nodules     followed by Dr Raul Del  . Fatty liver   . Renal cyst     right  . Pulmonary hypertension     Current Outpatient Prescriptions on File Prior to Visit  Medication Sig Dispense Refill  . alendronate (FOSAMAX) 70 MG tablet Take 70 mg by mouth every 7 (seven) days.       Marland Kitchen amitriptyline (ELAVIL) 25 MG tablet TAKE ONE TO TWO TABLETS AT BEDTIME  60 tablet  5  . amLODipine (NORVASC) 10 MG tablet TAKE ONE (1) TABLET EACH DAY  90 tablet  5  . atorvastatin (LIPITOR) 20 MG tablet TAKE ONE TABLET AT BEDTIME  90 tablet  0  . benazepril (LOTENSIN) 40 MG tablet TAKE ONE (1) TABLET EACH  DAY  90 tablet  1  . Calcium-Magnesium-Vitamin D 600-40-500 MG-MG-UNIT TB24 Take 630 mg by mouth 4 (four) times daily.      [name] Sodium 30-100 MG CAPS Take by mouth.      . fluticasone (FLONASE) 50 MCG/ACT nasal spray Place 2 sprays into the nose daily.      . furosemide (LASIX) 20 MG tablet Take 1 tablet (20 mg total) by mouth daily as needed.  30 tablet  5  . gabapentin (NEURONTIN) 300 MG capsule Take 300 mg by mouth 3 (three) times daily.      . hydrochlorothiazide (HYDRODIURIL) 25 MG tablet TAKE ONE (1) TABLET EACH DAY  90 tablet  1  . HYDROcodone-acetaminophen (NORCO) 10-325 MG per tablet Take 1 tablet by mouth every 4 (four) hours as needed.      . metoprolol succinate (TOPROL-XL) 100 MG 24 hr tablet TAKE ONE TABLET TWICE DAILY. TAKE WITH OR IMMEDIATELY FOLLOWING A MEAL  60 tablet  5  . morphine (MS CONTIN) 100 MG 12 hr tablet Take by mouth 3 (three) times daily. 37m am, 1096mlunch, 6049minner      . Multiple Vitamins-Minerals (CENTRUM SILVER ADULT 50+ PO) Take by mouth daily.      .Marland Kitchen  nystatin cream (MYCOSTATIN) Apply topically 2 (two) times daily.  30 g  1  . pantoprazole (PROTONIX) 40 MG injection Take 40 mg by mouth 2 (two) times daily.       . polyethylene glycol powder (GLYCOLAX/MIRALAX) powder MIX 17GM (AS MARKED IN BOTTLE TOP) IN 8 OUNCES OF WATER, MIX AND DRINK ONCE A DAY AS DIRECTED.  527 g  11  . sodium chloride (OCEAN) 0.65 % nasal spray Place 1 spray into the nose as needed.      Marland Kitchen SYNTHROID 112 MCG tablet TAKE ONE (1) TABLET EACH DAY  90 tablet  2   No current facility-administered medications on file prior to visit.    Review of Systems Patient denies any headache, lightheadedness or dizziness.  No chest pain, tightness or palpitations. Breathing is stable.  Reports is better.  No cough or congestion.  No nausea or vomiting.  No abdominal pain or cramping.  No BRBPR or melana.   No urine change.  Swelling overall better.   Followed by Dr Saralyn Pilar.  From  a cardiac standpoint stable.  Increased fatigue.  Knees feel weak.  Working with therapy.  Trying to decrease her pain medication.  Some acid reflux as outlined.         Objective:   Physical Exam  Filed Vitals:   02/23/14 1008  BP: 130/60  Pulse: 66  Temp: 98.3 F (36.8 C)   Blood pressure recheck:  62  80 year old female in no acute distress.   HEENT:  Nares- clear.  Oropharynx - without lesions. NECK:  Supple.  Nontender.  No audible bruit.  HEART:  Appears to be regular. LUNGS:  No crackles or wheezing audible.  Respirations even and unlabored.  RADIAL PULSE:  Equal bilaterally.    BREASTS:  No nipple discharge or nipple retraction present.  Could not appreciate any distinct nodules or axillary adenopathy.  ABDOMEN:  Soft, nontender.  Bowel sounds present and normal.  No audible abdominal bruit.  GU:  Not performed.     EXTREMITIES:  Some increased pedal and ankle edema.  Overall stable.           Assessment & Plan:  MSK.  Has chronic pain in her back.  Pain better since her surgery.  Attempting to decrease her pain medication.   CARDIOVASCULAR.  Sees Dr Saralyn Pilar.  Currently doing well.       QUESTIONABLE NEUROPATHY.  On gabapentin.     HEALTH MAINTENANCE.  Physical today.  Being followed by GI.  Pap 04/05/09 within normal limits.  Mammogram 08/04/13 -  Birads I.

## 2014-03-02 ENCOUNTER — Encounter: Payer: Self-pay | Admitting: General Practice

## 2014-03-05 ENCOUNTER — Encounter: Payer: Self-pay | Admitting: Internal Medicine

## 2014-03-07 ENCOUNTER — Encounter: Payer: Self-pay | Admitting: Internal Medicine

## 2014-03-18 ENCOUNTER — Ambulatory Visit: Payer: Self-pay | Admitting: Internal Medicine

## 2014-03-18 LAB — CBC CANCER CENTER
BASOS ABS: 0 x10 3/mm (ref 0.0–0.1)
Basophil %: 0.3 %
EOS PCT: 2.3 %
Eosinophil #: 0.2 x10 3/mm (ref 0.0–0.7)
HCT: 33.2 % — AB (ref 35.0–47.0)
HGB: 10.8 g/dL — ABNORMAL LOW (ref 12.0–16.0)
LYMPHS PCT: 25.6 %
Lymphocyte #: 1.7 x10 3/mm (ref 1.0–3.6)
MCH: 30.6 pg (ref 26.0–34.0)
MCHC: 32.5 g/dL (ref 32.0–36.0)
MCV: 94 fL (ref 80–100)
MONOS PCT: 7.3 %
Monocyte #: 0.5 x10 3/mm (ref 0.2–0.9)
NEUTROS ABS: 4.3 x10 3/mm (ref 1.4–6.5)
Neutrophil %: 64.5 %
Platelet: 154 x10 3/mm (ref 150–440)
RBC: 3.52 10*6/uL — ABNORMAL LOW (ref 3.80–5.20)
RDW: 14.9 % — ABNORMAL HIGH (ref 11.5–14.5)
WBC: 6.7 x10 3/mm (ref 3.6–11.0)

## 2014-03-18 LAB — HEPATIC FUNCTION PANEL A (ARMC)
ALT: 20 U/L (ref 12–78)
Albumin: 3.5 g/dL (ref 3.4–5.0)
Alkaline Phosphatase: 94 U/L
BILIRUBIN TOTAL: 0.3 mg/dL (ref 0.2–1.0)
SGOT(AST): 20 U/L (ref 15–37)
Total Protein: 7.5 g/dL (ref 6.4–8.2)

## 2014-03-18 LAB — FERRITIN: FERRITIN (ARMC): 68 ng/mL (ref 8–388)

## 2014-03-18 LAB — CREATININE, SERUM
Creatinine: 0.89 mg/dL (ref 0.60–1.30)
EGFR (African American): >60
EGFR (Non-African Amer.): >60

## 2014-03-18 LAB — IRON AND TIBC
IRON BIND. CAP.(TOTAL): 281 ug/dL (ref 250–450)
Iron Saturation: 26 %
Iron: 72 ug/dL (ref 50–170)
Unbound Iron-Bind.Cap.: 209 ug/dL

## 2014-03-18 LAB — LACTATE DEHYDROGENASE: LDH: 161 U/L (ref 81–246)

## 2014-03-22 LAB — PROT IMMUNOELECTROPHORES(ARMC)

## 2014-03-22 LAB — KAPPA/LAMBDA FREE LIGHT CHAINS (ARMC)

## 2014-04-02 ENCOUNTER — Ambulatory Visit: Payer: Self-pay | Admitting: Internal Medicine

## 2014-04-02 ENCOUNTER — Encounter: Payer: Self-pay | Admitting: General Practice

## 2014-04-18 LAB — CBC CANCER CENTER
Basophil #: 0 x10 3/mm (ref 0.0–0.1)
Basophil %: 0.4 %
Eosinophil #: 0.4 x10 3/mm (ref 0.0–0.7)
Eosinophil %: 6.4 %
HCT: 33.1 % — ABNORMAL LOW (ref 35.0–47.0)
HGB: 10.7 g/dL — ABNORMAL LOW (ref 12.0–16.0)
Lymphocyte #: 2.4 x10 3/mm (ref 1.0–3.6)
Lymphocyte %: 36.5 %
MCH: 30.4 pg (ref 26.0–34.0)
MCHC: 32.3 g/dL (ref 32.0–36.0)
MCV: 94 fL (ref 80–100)
Monocyte #: 0.6 x10 3/mm (ref 0.2–0.9)
Monocyte %: 8.4 %
Neutrophil #: 3.2 x10 3/mm (ref 1.4–6.5)
Neutrophil %: 48.3 %
Platelet: 164 x10 3/mm (ref 150–440)
RBC: 3.52 10*6/uL — ABNORMAL LOW (ref 3.80–5.20)
RDW: 14.8 % — ABNORMAL HIGH (ref 11.5–14.5)
WBC: 6.6 x10 3/mm (ref 3.6–11.0)

## 2014-04-20 ENCOUNTER — Other Ambulatory Visit: Payer: Self-pay | Admitting: Internal Medicine

## 2014-05-03 ENCOUNTER — Ambulatory Visit: Payer: Self-pay | Admitting: Internal Medicine

## 2014-05-17 ENCOUNTER — Ambulatory Visit (INDEPENDENT_AMBULATORY_CARE_PROVIDER_SITE_OTHER): Payer: Medicare Other | Admitting: Pulmonary Disease

## 2014-05-17 ENCOUNTER — Encounter: Payer: Self-pay | Admitting: Pulmonary Disease

## 2014-05-17 VITALS — BP 128/72 | HR 66 | Ht 65.0 in | Wt 224.0 lb

## 2014-05-17 DIAGNOSIS — G4733 Obstructive sleep apnea (adult) (pediatric): Secondary | ICD-10-CM

## 2014-05-17 DIAGNOSIS — I272 Pulmonary hypertension, unspecified: Secondary | ICD-10-CM

## 2014-05-17 DIAGNOSIS — I2789 Other specified pulmonary heart diseases: Secondary | ICD-10-CM

## 2014-05-17 NOTE — Progress Notes (Signed)
Subjective:    Patient ID: Kimberly Roy, female    DOB: 06-25-1931, 80 y.o.   MRN: [id number]  Synopsis: Kimberly Roy first saw the [facility] pulmonary clinic in November of 2013 for evaluation of pulmonary hypertension. In November 2012 she had a left heart cath and right heart cath. Her left heart catheterization showed no obstructive coronary disease the right heart cath showed a mean PA pressure of 35. This is associated with a cardiac index of 2.99 as well as a wedge pressure of 21. An echocardiogram at that time showed an LVEF of 40-45% and right ventricular dilation. She had used Adcirca but this did not improve her symptoms. She noted symptoms consistent with obstructive sleep apnea and never had a polysomnogram.  HPI   05/17/14 ROV > [name] has been doing well since I saw her last. She says that she still doesn't like the CPAP machine but still wears it.  She had knee surgery a few months back and didn't wear it much.  She really just started using it back in the last three weeks.  It sounds like the use has been pretty poor in fact.  She has some episodes of claustrophobia.  She would like to try another mask.  She is still sleeping in a chair.  She can't sleep in the bed because of bilateral hip pain.  She feels no shortness of breath with her regular activity.     Past Medical History  Diagnosis Date  . Osteoarthritis     knees/cervical and lumbar spine  . Hypertension   . Hyperlipidemia   . Hypothyroidism   . Anemia   . Depression   . Anxiety   . Nephrolithiasis   . Interstitial cystitis   . Pulmonary nodules     followed by Dr Raul Del  . Fatty liver   . Renal cyst     right  . Pulmonary hypertension     Review of Systems  Constitutional: Positive for fatigue. Negative for fever and chills.  HENT: Positive for congestion, postnasal drip, rhinorrhea and sinus pressure.   Respiratory: Negative for cough, shortness of breath and wheezing.   Cardiovascular: Positive for  leg swelling. Negative for chest pain and palpitations.       Objective:   Physical Exam   Filed Vitals:   05/17/14 1554  BP: 128/72  Pulse: 66  Height: _0  (1.651 m)  Weight: 224 lb (101.606 kg)  SpO2: 96%  RA  Gen: obese, chronically ill appearing, no acute distress HEENT: NCAT,  OP clear PULM: CTA B CV: RRR, systolic murmur RUSB, no JVD AB: BS+, soft, nontender, no hsm Ext: warm, compression stockings in place, no clubbing, no cyanosis  03/2012 Full PFT >> Ratio 82%, FEV 1.45 L (82%), TLC 2.99L (72% pred) DLCO 53% pred 07/2011 LHC >> normal coronary anatomy 07/2011 RHC >> Wedge 21; PA mean 35, 2.99 woods units; CO 4.6 L/min, CI 2.3 L/min/m2 07/2011 TTE >> mild RV dilation, LVEF 40-45%  12/29/2012 EKG > poor study due to TENS unit, no clear ST wave changes     Assessment & Plan:   Pulmonary hypertension Due to systolic CHF and sleep apnea. Need to focus treatment on those. No role for pulmonary vasodilators  OSA (obstructive sleep apnea) Her compliance has been poor mostly due to intolerance of the mask.  We will prescribe a nasal mask and will arrange for a downloaded compliance report in about two months.    Updated Medication List Outpatient  Encounter Prescriptions as of 05/17/2014  Medication Sig  . alendronate (FOSAMAX) 70 MG tablet Take 70 mg by mouth every 7 (seven) days.   Marland Kitchen amitriptyline (ELAVIL) 25 MG tablet TAKE ONE TO TWO TABLETS AT BEDTIME  . amLODipine (NORVASC) 10 MG tablet TAKE ONE (1) TABLET EACH DAY  . atorvastatin (LIPITOR) 20 MG tablet TAKE ONE TABLET AT BEDTIME  . benazepril (LOTENSIN) 40 MG tablet TAKE ONE (1) TABLET EACH DAY  . Calcium-Magnesium-Vitamin D 600-40-500 MG-MG-UNIT TB24 Take 630 mg by mouth 4 (four) times daily.  [name] Sodium 30-100 MG CAPS Take by mouth.  . fluticasone (FLONASE) 50 MCG/ACT nasal spray Place 2 sprays into the nose daily.  . furosemide (LASIX) 20 MG tablet Take 1 tablet (20 mg total) by mouth  daily as needed.  . gabapentin (NEURONTIN) 300 MG capsule Take 300 mg by mouth 3 (three) times daily.  . hydrochlorothiazide (HYDRODIURIL) 25 MG tablet TAKE ONE (1) TABLET EACH DAY  . HYDROcodone-acetaminophen (NORCO) 10-325 MG per tablet Take 1 tablet by mouth every 4 (four) hours as needed.  . metoprolol succinate (TOPROL-XL) 100 MG 24 hr tablet TAKE ONE TABLET TWICE DAILY. TAKE WITH OR IMMEDIATELY FOLLOWING A MEAL  . morphine (MS CONTIN) 100 MG 12 hr tablet Take by mouth 3 (three) times daily. 45m am, 101mlunch, 604minner  . Multiple Vitamins-Minerals (CENTRUM SILVER ADULT 50+ PO) Take by mouth daily.  . nMarland Kitchenstatin cream (MYCOSTATIN) Apply topically 2 (two) times daily.  . pantoprazole (PROTONIX) 40 MG injection Take 40 mg by mouth 2 (two) times daily.   . polyethylene glycol powder (GLYCOLAX/MIRALAX) powder MIX 17GM (AS MARKED IN BOTTLE TOP) IN 8 OUNCES OF WATER, MIX AND DRINK ONCE A DAY AS DIRECTED.  . sMarland Kitchendium chloride (OCEAN) 0.65 % nasal spray Place 1 spray into the nose as needed.  . SMarland KitchenNTHROID 112 MCG tablet TAKE ONE (1) TABLET EACH DAY

## 2014-05-17 NOTE — Patient Instructions (Signed)
We will send prescription to [facility] for a nasal CPAP mask. We will ask for a download from your CPAP machine in 2 months We will see you back in 6 months

## 2014-05-17 NOTE — Assessment & Plan Note (Signed)
Due to systolic CHF and sleep apnea. Need to focus treatment on those. No role for pulmonary vasodilators

## 2014-05-17 NOTE — Assessment & Plan Note (Signed)
Her compliance has been poor mostly due to intolerance of the mask.  We will prescribe a nasal mask and will arrange for a downloaded compliance report in about two months.

## 2014-05-25 ENCOUNTER — Other Ambulatory Visit: Payer: Self-pay | Admitting: Internal Medicine

## 2014-05-31 ENCOUNTER — Ambulatory Visit (INDEPENDENT_AMBULATORY_CARE_PROVIDER_SITE_OTHER): Payer: Medicare Other | Admitting: Internal Medicine

## 2014-05-31 ENCOUNTER — Other Ambulatory Visit: Payer: Self-pay | Admitting: Internal Medicine

## 2014-05-31 ENCOUNTER — Encounter: Payer: Self-pay | Admitting: Internal Medicine

## 2014-05-31 VITALS — BP 138/70 | HR 71 | Temp 98.5°F | Ht 65.0 in | Wt 221.5 lb

## 2014-05-31 DIAGNOSIS — I2789 Other specified pulmonary heart diseases: Secondary | ICD-10-CM

## 2014-05-31 DIAGNOSIS — D649 Anemia, unspecified: Secondary | ICD-10-CM

## 2014-05-31 DIAGNOSIS — F411 Generalized anxiety disorder: Secondary | ICD-10-CM

## 2014-05-31 DIAGNOSIS — E039 Hypothyroidism, unspecified: Secondary | ICD-10-CM

## 2014-05-31 DIAGNOSIS — F439 Reaction to severe stress, unspecified: Secondary | ICD-10-CM

## 2014-05-31 DIAGNOSIS — E7800 Pure hypercholesterolemia, unspecified: Secondary | ICD-10-CM

## 2014-05-31 DIAGNOSIS — J3489 Other specified disorders of nose and nasal sinuses: Secondary | ICD-10-CM

## 2014-05-31 DIAGNOSIS — Z733 Stress, not elsewhere classified: Secondary | ICD-10-CM

## 2014-05-31 DIAGNOSIS — R7309 Other abnormal glucose: Secondary | ICD-10-CM

## 2014-05-31 DIAGNOSIS — G4733 Obstructive sleep apnea (adult) (pediatric): Secondary | ICD-10-CM

## 2014-05-31 DIAGNOSIS — R1310 Dysphagia, unspecified: Secondary | ICD-10-CM

## 2014-05-31 DIAGNOSIS — E669 Obesity, unspecified: Secondary | ICD-10-CM

## 2014-05-31 DIAGNOSIS — I10 Essential (primary) hypertension: Secondary | ICD-10-CM

## 2014-05-31 DIAGNOSIS — I272 Pulmonary hypertension, unspecified: Secondary | ICD-10-CM

## 2014-05-31 DIAGNOSIS — Z23 Encounter for immunization: Secondary | ICD-10-CM

## 2014-05-31 DIAGNOSIS — M7989 Other specified soft tissue disorders: Secondary | ICD-10-CM

## 2014-05-31 DIAGNOSIS — R490 Dysphonia: Secondary | ICD-10-CM

## 2014-05-31 DIAGNOSIS — F419 Anxiety disorder, unspecified: Secondary | ICD-10-CM

## 2014-05-31 DIAGNOSIS — R739 Hyperglycemia, unspecified: Secondary | ICD-10-CM

## 2014-05-31 NOTE — Progress Notes (Signed)
Pre visit review using our clinic review tool, if applicable. No additional management support is needed unless otherwise documented below in the visit note.

## 2014-06-01 ENCOUNTER — Encounter: Payer: Self-pay | Admitting: Internal Medicine

## 2014-06-01 DIAGNOSIS — J3489 Other specified disorders of nose and nasal sinuses: Secondary | ICD-10-CM | POA: Insufficient documentation

## 2014-06-01 DIAGNOSIS — R1310 Dysphagia, unspecified: Secondary | ICD-10-CM | POA: Insufficient documentation

## 2014-06-01 DIAGNOSIS — F439 Reaction to severe stress, unspecified: Secondary | ICD-10-CM | POA: Insufficient documentation

## 2014-06-01 NOTE — Assessment & Plan Note (Signed)
Persistent.  Refer back to ENT for further evaluation.  Also refer to GI given dysphagia, etc.

## 2014-06-01 NOTE — Assessment & Plan Note (Signed)
Has seen GI previously.  Was referred to hematology for further evaluation and treatment.  Follow cbc.  Obtain records.

## 2014-06-01 NOTE — Assessment & Plan Note (Signed)
Have discussed diet, exercise and weight loss.  Follow.

## 2014-06-01 NOTE — Assessment & Plan Note (Signed)
Describes intermittent episodes of food getting "stuck".   Had a bad episode recently as outlined.  On protonix bid.  Will refer to GI for further evaluation and treatment.
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
## 2014-06-01 NOTE — Assessment & Plan Note (Signed)
Followed by Dr Lake Bells.  Had sleep study.  CPAP.

## 2014-06-01 NOTE — Assessment & Plan Note (Signed)
On Lipitor.  Low cholesterol diet and exercise.  Follow lipid proflie and liver panel

## 2014-06-01 NOTE — Assessment & Plan Note (Signed)
Saline nasal spray.  May want to cut back on taking a regular antihistamine.  Given this with the persistent hoarseness, will refer back to Dr Kathyrn Sheriff for further evaluation.

## 2014-06-01 NOTE — Assessment & Plan Note (Signed)
Increased stress and anxiety.  She request referral to psychiatrist in town.  Referral made.

## 2014-06-01 NOTE — Assessment & Plan Note (Signed)
Needs to use CPAP regularly.

## 2014-06-01 NOTE — Assessment & Plan Note (Signed)
Blood pressure under control on current medication regimen.  Follow.

## 2014-06-01 NOTE — Assessment & Plan Note (Signed)
Let swelling overall improved.  Had unna boots.  Now wearing compression hose.  Follow.

## 2014-06-01 NOTE — Assessment & Plan Note (Signed)
On synthroid.  Follow tsh.

## 2014-06-07 ENCOUNTER — Other Ambulatory Visit (INDEPENDENT_AMBULATORY_CARE_PROVIDER_SITE_OTHER): Payer: Medicare Other

## 2014-06-07 DIAGNOSIS — I10 Essential (primary) hypertension: Secondary | ICD-10-CM

## 2014-06-07 DIAGNOSIS — D649 Anemia, unspecified: Secondary | ICD-10-CM

## 2014-06-07 DIAGNOSIS — E7800 Pure hypercholesterolemia, unspecified: Secondary | ICD-10-CM

## 2014-06-07 DIAGNOSIS — R739 Hyperglycemia, unspecified: Secondary | ICD-10-CM

## 2014-06-07 LAB — CBC WITH DIFFERENTIAL/PLATELET
Basophils Absolute: 0 10*3/uL (ref 0.0–0.1)
Basophils Relative: 0.4 % (ref 0.0–3.0)
Eosinophils Absolute: 0.2 10*3/uL (ref 0.0–0.7)
Eosinophils Relative: 3.1 % (ref 0.0–5.0)
HCT: 34 % — ABNORMAL LOW (ref 36.0–46.0)
Hemoglobin: 11.1 g/dL — ABNORMAL LOW (ref 12.0–15.0)
Lymphocytes Relative: 38.1 % (ref 12.0–46.0)
Lymphs Abs: 3 10*3/uL (ref 0.7–4.0)
MCHC: 32.8 g/dL (ref 30.0–36.0)
MCV: 95.7 fl (ref 78.0–100.0)
MONO ABS: 0.6 10*3/uL (ref 0.1–1.0)
Monocytes Relative: 7 % (ref 3.0–12.0)
Neutro Abs: 4.1 10*3/uL (ref 1.4–7.7)
Neutrophils Relative %: 51.4 % (ref 43.0–77.0)
PLATELETS: 179 10*3/uL (ref 150.0–400.0)
RBC: 3.56 Mil/uL — ABNORMAL LOW (ref 3.87–5.11)
RDW: 15.9 % — AB (ref 11.5–15.5)
WBC: 7.9 10*3/uL (ref 4.0–10.5)

## 2014-06-07 LAB — HEMOGLOBIN A1C: HEMOGLOBIN A1C: 6.2 % (ref 4.6–6.5)

## 2014-06-08 LAB — FERRITIN: Ferritin: 79.5 ng/mL (ref 10.0–291.0)

## 2014-06-08 LAB — HEPATIC FUNCTION PANEL
ALBUMIN: 4 g/dL (ref 3.5–5.2)
ALT: 13 U/L (ref 0–35)
AST: 18 U/L (ref 0–37)
Alkaline Phosphatase: 79 U/L (ref 39–117)
BILIRUBIN DIRECT: 0.1 mg/dL (ref 0.0–0.3)
TOTAL PROTEIN: 7.5 g/dL (ref 6.0–8.3)
Total Bilirubin: 0.5 mg/dL (ref 0.2–1.2)

## 2014-06-08 LAB — BASIC METABOLIC PANEL
BUN: 15 mg/dL (ref 6–23)
CHLORIDE: 102 meq/L (ref 96–112)
CO2: 28 mEq/L (ref 19–32)
Calcium: 9.4 mg/dL (ref 8.4–10.5)
Creatinine, Ser: 0.8 mg/dL (ref 0.4–1.2)
GFR: 72.08 mL/min (ref >60.00)
Glucose, Bld: 85 mg/dL (ref 70–99)
POTASSIUM: 4.6 meq/L (ref 3.5–5.1)
Sodium: 141 mEq/L (ref 135–145)

## 2014-06-08 LAB — LIPID PANEL
CHOL/HDL RATIO: 4
CHOLESTEROL: 132 mg/dL (ref 0–200)
HDL: 37.4 mg/dL — AB (ref >39.00)
LDL Cholesterol: 77 mg/dL (ref 0–99)
NonHDL: 94.6
TRIGLYCERIDES: 89 mg/dL (ref 0.0–149.0)
VLDL: 17.8 mg/dL (ref 0.0–40.0)

## 2014-06-09 ENCOUNTER — Encounter: Payer: Self-pay | Admitting: Internal Medicine

## 2014-06-09 ENCOUNTER — Ambulatory Visit: Payer: Self-pay | Admitting: Gastroenterology

## 2014-06-17 ENCOUNTER — Other Ambulatory Visit: Payer: Self-pay

## 2014-07-11 ENCOUNTER — Encounter: Payer: Self-pay | Admitting: Internal Medicine

## 2014-07-11 DIAGNOSIS — Z1239 Encounter for other screening for malignant neoplasm of breast: Secondary | ICD-10-CM

## 2014-07-11 MED ORDER — NYSTATIN 100000 UNIT/GM EX CREA: TOPICAL_CREAM | Freq: Two times a day (BID) | CUTANEOUS | Status: DC

## 2014-07-11 NOTE — Telephone Encounter (Signed)
Order placed for screening mammogram.  rx sent in for nystatin cream - per pt request.

## 2014-07-18 ENCOUNTER — Other Ambulatory Visit: Payer: Self-pay | Admitting: Internal Medicine

## 2014-08-22 ENCOUNTER — Encounter: Payer: Self-pay | Admitting: Internal Medicine

## 2014-08-24 ENCOUNTER — Encounter: Payer: Self-pay | Admitting: Internal Medicine

## 2014-08-24 ENCOUNTER — Ambulatory Visit: Payer: Self-pay | Admitting: Internal Medicine

## 2014-08-24 LAB — HM MAMMOGRAPHY: HM Mammogram: NEGATIVE

## 2014-08-25 ENCOUNTER — Encounter: Payer: Self-pay | Admitting: Internal Medicine

## 2014-08-30 ENCOUNTER — Ambulatory Visit (INDEPENDENT_AMBULATORY_CARE_PROVIDER_SITE_OTHER): Payer: Medicare Other | Admitting: Internal Medicine

## 2014-08-30 ENCOUNTER — Encounter: Payer: Self-pay | Admitting: Internal Medicine

## 2014-08-30 VITALS — BP 130/60 | HR 63 | Temp 98.4°F | Ht 65.0 in | Wt 224.2 lb

## 2014-08-30 DIAGNOSIS — R1310 Dysphagia, unspecified: Secondary | ICD-10-CM

## 2014-08-30 DIAGNOSIS — G4733 Obstructive sleep apnea (adult) (pediatric): Secondary | ICD-10-CM

## 2014-08-30 DIAGNOSIS — E7800 Pure hypercholesterolemia, unspecified: Secondary | ICD-10-CM

## 2014-08-30 DIAGNOSIS — D649 Anemia, unspecified: Secondary | ICD-10-CM

## 2014-08-30 DIAGNOSIS — Z658 Other specified problems related to psychosocial circumstances: Secondary | ICD-10-CM

## 2014-08-30 DIAGNOSIS — F439 Reaction to severe stress, unspecified: Secondary | ICD-10-CM

## 2014-08-30 DIAGNOSIS — E2839 Other primary ovarian failure: Secondary | ICD-10-CM

## 2014-08-30 DIAGNOSIS — M7989 Other specified soft tissue disorders: Secondary | ICD-10-CM

## 2014-08-30 DIAGNOSIS — R2681 Unsteadiness on feet: Secondary | ICD-10-CM

## 2014-08-30 DIAGNOSIS — M542 Cervicalgia: Secondary | ICD-10-CM

## 2014-08-30 DIAGNOSIS — E039 Hypothyroidism, unspecified: Secondary | ICD-10-CM

## 2014-08-30 DIAGNOSIS — E669 Obesity, unspecified: Secondary | ICD-10-CM

## 2014-08-30 DIAGNOSIS — K5900 Constipation, unspecified: Secondary | ICD-10-CM

## 2014-08-30 DIAGNOSIS — I10 Essential (primary) hypertension: Secondary | ICD-10-CM

## 2014-08-30 DIAGNOSIS — Z13820 Encounter for screening for osteoporosis: Secondary | ICD-10-CM

## 2014-08-30 DIAGNOSIS — R351 Nocturia: Secondary | ICD-10-CM

## 2014-08-30 DIAGNOSIS — K5909 Other constipation: Secondary | ICD-10-CM

## 2014-08-30 DIAGNOSIS — R739 Hyperglycemia, unspecified: Secondary | ICD-10-CM

## 2014-08-30 NOTE — Progress Notes (Signed)
Pre visit review using our clinic review tool, if applicable. No additional management support is needed unless otherwise documented below in the visit note.

## 2014-08-31 ENCOUNTER — Encounter: Payer: Self-pay | Admitting: Internal Medicine

## 2014-09-03 ENCOUNTER — Encounter: Payer: Self-pay | Admitting: Internal Medicine

## 2014-09-03 DIAGNOSIS — M542 Cervicalgia: Secondary | ICD-10-CM | POA: Insufficient documentation

## 2014-09-03 DIAGNOSIS — R351 Nocturia: Secondary | ICD-10-CM | POA: Insufficient documentation

## 2014-09-03 DIAGNOSIS — Z6833 Body mass index (BMI) 33.0-33.9, adult: Secondary | ICD-10-CM | POA: Insufficient documentation

## 2014-09-03 DIAGNOSIS — R2681 Unsteadiness on feet: Secondary | ICD-10-CM | POA: Insufficient documentation

## 2014-09-03 NOTE — Progress Notes (Signed)
Subjective:    Patient ID: Kimberly Roy, female    DOB: Mar 03, 1931, 81 y.o.   MRN: [id number]  HPI 81 year old female with past history of hypertension, hypercholesterolemia, hypothyroidism, anemia, chronic constipation and pulmonary hypertension.  She comes in today for a scheduled follow up.   Accompanied by her husband.  History obtained from both of them.  She had reported noticing that it seems like food is getting "stuck" when she swallows.  Occasional acid reflux reported.  See last note for details.  She saw GI.  EGD 06/09/14 - no abnormality noted.  Esophagus dilated.  Reports still noticing some dysphagia. Reports have had episodes of food "coming back up". Also having persistent problems with constipation.  Breathing is stable.   No chest pain or tightness.  No vomiting.   Wearing her compression stockings.  Leg swelling has been better.   Bowels stable.  Has seen Dr Kathyrn Sheriff.    Blood pressure doing well..  Also reported increased stress and anxiety.  Seeing Dr Nicolasa Ducking.  On cymbalta now.  Off trazodone.  Off zoloft (intolerance).  Just changed over to cymbalta.  Not using her CPAP.  Discussed the need to use the CPAP regularly.  She is also having problems with pain in her neck.  States was told previously had bone spur.  Reports unsteady gait.  States right leg shorter.  Discussed physical therapy.  Using miralax for constipation.     Past Medical History  Diagnosis Date  . Osteoarthritis     knees/cervical and lumbar spine  . Hypertension   . Hyperlipidemia   . Hypothyroidism   . Anemia   . Depression   . Anxiety   . Nephrolithiasis   . Interstitial cystitis   . Pulmonary nodules     followed by Dr Raul Del  . Fatty liver   . Renal cyst     right  . Pulmonary hypertension     Current Outpatient Prescriptions on File Prior to Visit  Medication Sig Dispense Refill  . alendronate (FOSAMAX) 70 MG tablet Take 70 mg by mouth every 7 (seven) days.     Marland Kitchen atorvastatin (LIPITOR) 20 MG  tablet TAKE ONE TABLET AT BEDTIME 90 tablet 1  . benazepril (LOTENSIN) 40 MG tablet TAKE ONE (1) TABLET EACH DAY 90 tablet 1  . Calcium-Magnesium-Vitamin D 600-40-500 MG-MG-UNIT TB24 Take 630 mg by mouth 4 (four) times daily.    [name] Sodium 30-100 MG CAPS Take by mouth.    . fluticasone (FLONASE) 50 MCG/ACT nasal spray Place 2 sprays into the nose daily.    . furosemide (LASIX) 20 MG tablet TAKE ONE (1) TABLET EACH DAY AS NEEDED 30 tablet 6  . gabapentin (NEURONTIN) 300 MG capsule Take 300 mg by mouth 3 (three) times daily.    . hydrochlorothiazide (HYDRODIURIL) 25 MG tablet TAKE ONE (1) TABLET EACH DAY 90 tablet 1  . HYDROcodone-acetaminophen (NORCO) 10-325 MG per tablet Take 1 tablet by mouth every 4 (four) hours as needed.    . metoprolol succinate (TOPROL-XL) 100 MG 24 hr tablet TAKE ONE TABLET TWICE DAILY. TAKE WITH OR IMMEDIATELY FOLLOWING A MEAL 60 tablet 5  . morphine (MS CONTIN) 100 MG 12 hr tablet Take by mouth 3 (three) times daily. 66m am, 1047mlunch, 6065minner    . Multiple Vitamins-Minerals (CENTRUM SILVER ADULT 50+ PO) Take by mouth daily.    . nMarland Kitchenstatin cream (MYCOSTATIN) Apply topically 2 (two) times daily. 30 g 1  . pantoprazole (  PROTONIX) 40 MG injection Take 40 mg by mouth 2 (two) times daily.     . polyethylene glycol powder (GLYCOLAX/MIRALAX) powder MIX 17GM (AS MARKED IN BOTTLE TOP) IN 8 OUNCES OF WATER, MIX AND DRINK ONCE A DAY AS DIRECTED. 527 g 2  . sodium chloride (OCEAN) 0.65 % nasal spray Place 1 spray into the nose as needed.    Marland Kitchen SYNTHROID 112 MCG tablet TAKE ONE (1) TABLET EACH DAY 90 tablet 1   No current facility-administered medications on file prior to visit.    Review of Systems Patient denies any headache, lightheadedness or dizziness.   No chest pain, tightness or palpitations. Breathing is stable.  No cough or congestion.  No nausea or vomiting.  No abdominal pain or cramping.  Dysphagia as outlined.  No BRBPR or melana.   Lower  extremity swelling overall better.   Followed by Dr Saralyn Pilar. From a cardiac standpoint stable.   Was having increased anxiety and stress.  Now seeing Dr Nicolasa Ducking.  Medication being adjusted.  Persistent constipation.  Nocturia.  Not using CPAP.  Discussed the need to use the CPAP.  Unsteady gait as outlined.       Objective:   Physical Exam  Filed Vitals:   08/30/14 1557  BP: 130/60  Pulse: 63  Temp: 98.4 F (79.68 C)   81 year old female in no acute distress.   HEENT:  Nares- clear.  Oropharynx - without lesions. NECK:  Supple.  Nontender.  No audible bruit.  HEART:  Appears to be regular. LUNGS:  No crackles or wheezing audible.  Respirations even and unlabored.  RADIAL PULSE:  Equal bilaterally.  ABDOMEN:  Soft, nontender.  Bowel sounds present and normal.  No audible abdominal bruit.   EXTREMITIES:  Some increased pedal and ankle edema.  Overall better.  Compression hose in place.           Assessment & Plan:  1. Nocturia - Urinalysis, Routine w reflex microscopic; Future  2. Obesity (BMI 30-39.9) Diet and weight loss.   3. Essential hypertension Blood pressure doing well.  Same medication regimen.  - Basic metabolic panel; Future  4. OSA (obstructive sleep apnea) Not wearing CPAP.  Discussed the need to wear CPAP.  Plans to start wearing more regularly.    5. Dysphagia Still problems with swallowing.  Just had EGD.  S/p dilatation.  Refer back to GI for further evaluation for possible etiology of ehr swallowing problems.    6. Hypothyroidism, unspecified hypothyroidism type On synthroid.  Follow tsh.    7. Anemia, unspecified anemia type Has seen GI.   - CBC with Differential; Future - Ferritin; Future  8. Hypercholesterolemia Low cholesterol diet and exercise.  Follow lipid panel and liver function.  On lipitor.  - Lipid panel; Future - Hepatic function panel; Future Lab Results  Component Value Date   CHOL 132 06/07/2014   HDL 37.40* 06/07/2014   LDLCALC  77 06/07/2014   TRIG 89.0 06/07/2014   CHOLHDL 4 06/07/2014   9. Leg swelling Better.  Continue compression hose.   10. Stress Seeing Dr Nicolasa Ducking.  On cymbalta.  Follow.   11. Screening for osteoporosis Schedule bone density.   12. Estrogen deficiency - DG Bone Density; Future  13. Neck pain Persistent.  Check C-Spine.    14. Unsteady gait States one leg shorter than the other leg.  Discussed physical therapy for strengthening and for gait training.    15. Chronic constipation On miralax.  Still problems.  Refer back to GI.    16. Hyperglycemia Low carb diet.   - Hemoglobin A1c; Future  17. MSK.  Has chronic pain in her back.  Pain better since her surgery.  Attempting to decrease her pain medication.   18. CARDIOVASCULAR.  Sees Dr Saralyn Pilar.  Currently doing well.       19. QUESTIONABLE NEUROPATHY.  On gabapentin.     HEALTH MAINTENANCE.  Physical 02/23/14.  Being followed by GI.  Pap 04/05/09 within normal limits.  Mammogram 08/24/14 -  Birads I.  I spent 40 minutes with the patient and more than 50% of the time was spent in consultation regarding the above.

## 2014-09-14 ENCOUNTER — Encounter: Payer: Self-pay | Admitting: Internal Medicine

## 2014-09-14 LAB — HM DEXA SCAN

## 2014-09-15 ENCOUNTER — Encounter: Payer: Self-pay | Admitting: *Deleted

## 2014-09-21 ENCOUNTER — Encounter: Payer: Self-pay | Admitting: Internal Medicine

## 2014-09-27 ENCOUNTER — Encounter: Payer: Self-pay | Admitting: Internal Medicine

## 2014-09-28 ENCOUNTER — Encounter: Payer: Self-pay | Admitting: *Deleted

## 2014-09-30 ENCOUNTER — Ambulatory Visit: Payer: Self-pay | Admitting: Internal Medicine

## 2014-10-02 ENCOUNTER — Telehealth: Payer: Self-pay | Admitting: Internal Medicine

## 2014-10-02 NOTE — Telephone Encounter (Signed)
Notified of xray results via my chart.  Moderate to severe arthritis changes.  Refer to physical therapy.

## 2014-10-12 ENCOUNTER — Encounter: Payer: Self-pay | Admitting: Internal Medicine

## 2014-10-17 ENCOUNTER — Other Ambulatory Visit: Payer: Self-pay | Admitting: Internal Medicine

## 2014-10-19 ENCOUNTER — Encounter: Payer: Self-pay | Admitting: Internal Medicine

## 2014-10-19 ENCOUNTER — Ambulatory Visit (INDEPENDENT_AMBULATORY_CARE_PROVIDER_SITE_OTHER): Payer: PPO | Admitting: Internal Medicine

## 2014-10-19 VITALS — BP 130/70 | HR 64 | Temp 98.1°F | Ht 65.0 in | Wt 227.1 lb

## 2014-10-19 DIAGNOSIS — F439 Reaction to severe stress, unspecified: Secondary | ICD-10-CM

## 2014-10-19 DIAGNOSIS — Z658 Other specified problems related to psychosocial circumstances: Secondary | ICD-10-CM

## 2014-10-19 DIAGNOSIS — K5900 Constipation, unspecified: Secondary | ICD-10-CM

## 2014-10-19 DIAGNOSIS — K5909 Other constipation: Secondary | ICD-10-CM

## 2014-10-19 DIAGNOSIS — R1310 Dysphagia, unspecified: Secondary | ICD-10-CM

## 2014-10-19 DIAGNOSIS — R739 Hyperglycemia, unspecified: Secondary | ICD-10-CM

## 2014-10-19 DIAGNOSIS — M810 Age-related osteoporosis without current pathological fracture: Secondary | ICD-10-CM

## 2014-10-19 DIAGNOSIS — E7800 Pure hypercholesterolemia, unspecified: Secondary | ICD-10-CM

## 2014-10-19 DIAGNOSIS — D649 Anemia, unspecified: Secondary | ICD-10-CM

## 2014-10-19 DIAGNOSIS — K625 Hemorrhage of anus and rectum: Secondary | ICD-10-CM

## 2014-10-19 DIAGNOSIS — I10 Essential (primary) hypertension: Secondary | ICD-10-CM

## 2014-10-19 LAB — BASIC METABOLIC PANEL
BUN: 18 mg/dL (ref 6–23)
CALCIUM: 9.6 mg/dL (ref 8.4–10.5)
CO2: 34 mEq/L — ABNORMAL HIGH (ref 19–32)
Chloride: 100 mEq/L (ref 96–112)
Creatinine, Ser: 0.74 mg/dL (ref 0.40–1.20)
GFR: 79.93 mL/min (ref >60.00)
GLUCOSE: 99 mg/dL (ref 70–99)
Potassium: 4.6 mEq/L (ref 3.5–5.1)
SODIUM: 140 meq/L (ref 135–145)

## 2014-10-19 LAB — LIPID PANEL
CHOL/HDL RATIO: 4
CHOLESTEROL: 136 mg/dL (ref 0–200)
HDL: 36.5 mg/dL — AB (ref >39.00)
LDL Cholesterol: 72 mg/dL (ref 0–99)
NonHDL: 99.5
TRIGLYCERIDES: 136 mg/dL (ref 0.0–149.0)
VLDL: 27.2 mg/dL (ref 0.0–40.0)

## 2014-10-19 LAB — CBC WITH DIFFERENTIAL/PLATELET
BASOS PCT: 0.2 % (ref 0.0–3.0)
Basophils Absolute: 0 10*3/uL (ref 0.0–0.1)
EOS PCT: 2 % (ref 0.0–5.0)
Eosinophils Absolute: 0.2 10*3/uL (ref 0.0–0.7)
HCT: 34.8 % — ABNORMAL LOW (ref 36.0–46.0)
HEMOGLOBIN: 11.7 g/dL — AB (ref 12.0–15.0)
LYMPHS PCT: 24.8 % (ref 12.0–46.0)
Lymphs Abs: 2.1 10*3/uL (ref 0.7–4.0)
MCHC: 33.6 g/dL (ref 30.0–36.0)
MCV: 94.6 fl (ref 78.0–100.0)
MONOS PCT: 6.3 % (ref 3.0–12.0)
Monocytes Absolute: 0.5 10*3/uL (ref 0.1–1.0)
NEUTROS ABS: 5.7 10*3/uL (ref 1.4–7.7)
NEUTROS PCT: 66.7 % (ref 43.0–77.0)
Platelets: 191 10*3/uL (ref 150.0–400.0)
RBC: 3.68 Mil/uL — AB (ref 3.87–5.11)
RDW: 13.9 % (ref 11.5–15.5)
WBC: 8.6 10*3/uL (ref 4.0–10.5)

## 2014-10-19 LAB — HEMOGLOBIN A1C: Hgb A1c MFr Bld: 6 % (ref 4.6–6.5)

## 2014-10-19 LAB — HEPATIC FUNCTION PANEL
ALBUMIN: 4.1 g/dL (ref 3.5–5.2)
ALT: 13 U/L (ref 0–35)
AST: 19 U/L (ref 0–37)
Alkaline Phosphatase: 91 U/L (ref 39–117)
Bilirubin, Direct: 0.1 mg/dL (ref 0.0–0.3)
TOTAL PROTEIN: 7.1 g/dL (ref 6.0–8.3)
Total Bilirubin: 0.4 mg/dL (ref 0.2–1.2)

## 2014-10-19 LAB — FERRITIN: Ferritin: 62.8 ng/mL (ref 10.0–291.0)

## 2014-10-19 LAB — VITAMIN D 25 HYDROXY (VIT D DEFICIENCY, FRACTURES): VITD: 28.56 ng/mL — AB (ref 30.00–100.00)

## 2014-10-19 NOTE — Progress Notes (Signed)
Pre visit review using our clinic review tool, if applicable. No additional management support is needed unless otherwise documented below in the visit note.

## 2014-10-19 NOTE — Assessment & Plan Note (Signed)
Recent bone density as outlined.  Discussed treatment options.  She wants to research the medication.  Follow.  Check vitamin D level.  Will let me know of her decision.

## 2014-10-19 NOTE — Assessment & Plan Note (Signed)
Persistent intermittent bleeding and exam as outlined.  Refer to Dr Pat Patrick for evaluation and treatment.

## 2014-10-19 NOTE — Assessment & Plan Note (Signed)
Seeing Dr Nicolasa Ducking.  See her note for details.  Follow.

## 2014-10-19 NOTE — Assessment & Plan Note (Signed)
Taking miralax.  Bowels are some better.  Saw GI.  Still with abdominal discomfort.  Refer back for f/u.  Pt agrees.

## 2014-10-19 NOTE — Assessment & Plan Note (Signed)
Has seen GI.  Recheck cbc.

## 2014-10-19 NOTE — Assessment & Plan Note (Signed)
Low cholesterol diet and exercise.  On lipitor.  Follow lipid panel and liver function tests.

## 2014-10-19 NOTE — Assessment & Plan Note (Signed)
Persistent problems swallowing.  Saw GI.  Had cxr.  Needs f/u with GI.  Schedule.

## 2014-10-19 NOTE — Assessment & Plan Note (Signed)
Blood pressure has been doing well.  Continue the same medication regimen.  Check met b.

## 2014-10-19 NOTE — Progress Notes (Signed)
Patient ID: Kimberly Roy, female   DOB: 28-Jan-1931, 81 y.o.   MRN: [id number]   Subjective:    Patient ID: Kimberly Roy, female    DOB: 02-06-1931, 81 y.o.   MRN: [id number]  HPI  Patient here to discuss her bone density results.  She also has several issues she would like to discuss.  Had a recent bone density that reveals a T score -4.0 at the femoral neck.  Unable to measure the back.  She has been on fosamax.  Has been having problems with swallowing.  We discussed stopping the fosamax.  Discussed other treatment options.  (reclast, forteo and prolia).  She wants to research these medications.  She recently saw Dr Candace Cruise for dysphagia.  Had cxr.  Do not have results.  She reports still having issues with swallowing.  She is taking miralax and a stool softener.  Bowels are some better.  Still with some abdominal discomfort.  Discussed f/u with GI.  She also reports rectal bleeding.  She is concerned about a hemorrhoid.  Recently saw Smith Robert for her knee.  Taking meloxicam.  Just started.  Plans to only take for two weeks.  With increased left elbow pain.  Just started the meloxicam.   No pain with flexion and extension.  Pain with palpation. No injury or trauma.     Past Medical History  Diagnosis Date  . Osteoarthritis     knees/cervical and lumbar spine  . Hypertension   . Hyperlipidemia   . Hypothyroidism   . Anemia   . Depression   . Anxiety   . Nephrolithiasis   . Interstitial cystitis   . Pulmonary nodules     followed by Dr Raul Del  . Fatty liver   . Renal cyst     right  . Pulmonary hypertension     Current Outpatient Prescriptions on File Prior to Visit  Medication Sig Dispense Refill  . alendronate (FOSAMAX) 70 MG tablet Take 70 mg by mouth every 7 (seven) days.     Marland Kitchen atorvastatin (LIPITOR) 20 MG tablet TAKE ONE TABLET AT BEDTIME 90 tablet 2  . benazepril (LOTENSIN) 40 MG tablet TAKE ONE (1) TABLET EACH DAY 90 tablet 1  . Calcium-Magnesium-Vitamin D 600-40-500 MG-MG-UNIT TB24  Take 630 mg by mouth 4 (four) times daily.    [name] Sodium 30-100 MG CAPS Take by mouth.    . DULoxetine (CYMBALTA) 30 MG capsule   0  . fluticasone (FLONASE) 50 MCG/ACT nasal spray Place 2 sprays into the nose daily.    . furosemide (LASIX) 20 MG tablet TAKE ONE (1) TABLET EACH DAY AS NEEDED 30 tablet 6  . gabapentin (NEURONTIN) 300 MG capsule Take 300 mg by mouth 3 (three) times daily.    . hydrochlorothiazide (HYDRODIURIL) 25 MG tablet TAKE ONE (1) TABLET EACH DAY 90 tablet 1  . HYDROcodone-acetaminophen (NORCO) 10-325 MG per tablet Take 1 tablet by mouth every 4 (four) hours as needed.    . metoprolol succinate (TOPROL-XL) 100 MG 24 hr tablet TAKE ONE TABLET TWICE DAILY. TAKE WITH OR IMMEDIATELY FOLLOWING A MEAL 60 tablet 5  . morphine (MS CONTIN) 100 MG 12 hr tablet Take by mouth 3 (three) times daily. 80m am, 1049mlunch, 6031minner    . Multiple Vitamins-Minerals (CENTRUM SILVER ADULT 50+ PO) Take by mouth daily.    . polyethylene glycol powder (GLYCOLAX/MIRALAX) powder MIX 17GM (AS MARKED IN BOTTLE TOP) IN 8 OUNCES OF WATER, MIX AND DRINK ONCE  A DAY AS DIRECTED. 527 g 2  . SYNTHROID 112 MCG tablet TAKE ONE (1) TABLET EACH DAY 90 tablet 2  . nystatin cream (MYCOSTATIN) Apply topically 2 (two) times daily. (Patient not taking: Reported on 10/19/2014) 30 g 1  . sodium chloride (OCEAN) 0.65 % nasal spray Place 1 spray into the nose as needed.     No current facility-administered medications on file prior to visit.    Review of Systems  Constitutional: Negative for appetite change, fatigue and unexpected weight change.  HENT: Negative for congestion and sinus pressure.   Respiratory: Negative for cough, chest tightness and shortness of breath.   Cardiovascular: Positive for leg swelling (some minimal leg swelling.  wearing support hose.  ). Negative for chest pain and palpitations.  Gastrointestinal: Positive for abdominal pain (abdominal discomfort as outlined.  ) and  constipation (moving some better.). Negative for vomiting and diarrhea.       Still with dysphagia,  Genitourinary: Negative for dysuria and difficulty urinating.  Musculoskeletal: Positive for back pain (chronic). Negative for joint swelling.  Skin: Negative for color change and rash.  Neurological: Negative for dizziness, light-headedness and headaches.  Hematological: Negative for adenopathy. Does not bruise/bleed easily.  Psychiatric/Behavioral:       Seeing Dr Nicolasa Ducking now for increased stress.  See her note.         Objective:    Physical Exam  Constitutional: She appears well-developed and well-nourished. No distress.  HENT:  Nose: Nose normal.  Mouth/Throat: Oropharynx is clear and moist.  Neck: Neck supple. No thyromegaly present.  Cardiovascular: Normal rate and regular rhythm.   Pulmonary/Chest: Breath sounds normal. No respiratory distress. She has no wheezes.  Abdominal: Soft. Bowel sounds are normal. There is no tenderness.  Genitourinary:  Rectal exam revealed - increased firm tissue above the anal opening.  No significant tenderness.  No active bleeding.    Musculoskeletal: She exhibits edema (minimal pedal and ankle edema). She exhibits no tenderness.  Increased point tenderness over her left elbow.  No pain with rotation of her left forearm.    Lymphadenopathy:    She has no cervical adenopathy.  Skin: No rash noted. No erythema.    BP 130/70 mmHg  Pulse 64  Temp(Src) 98.1 F (36.7 C) (Oral)  Ht _0  (1.651 m)  Wt 227 lb 2 oz (103.023 kg)  BMI 37.80 kg/m2  SpO2 96% Wt Readings from Last 3 Encounters:  10/19/14 227 lb 2 oz (103.023 kg)  08/30/14 224 lb 4 oz (101.719 kg)  05/31/14 221 lb 8 oz (100.472 kg)     Lab Results  Component Value Date   WBC 8.6 10/19/2014   HGB 11.7* 10/19/2014   HCT 34.8* 10/19/2014   PLT 191.0 10/19/2014   GLUCOSE 99 10/19/2014   CHOL 136 10/19/2014   TRIG 136.0 10/19/2014   HDL 36.50* 10/19/2014   LDLCALC 72 10/19/2014    ALT 13 10/19/2014   AST 19 10/19/2014   NA 140 10/19/2014   K 4.6 10/19/2014   CL 100 10/19/2014   CREATININE 0.74 10/19/2014   BUN 18 10/19/2014   CO2 34* 10/19/2014   TSH 1.99 02/21/2014   HGBA1C 6.0 10/19/2014      Assessment & Plan:   Problem List Items Addressed This Visit    Anemia    Has seen GI.  Recheck cbc.        Chronic constipation    Taking miralax.  Bowels are some better.  Saw GI.  Still with abdominal discomfort.  Refer back for f/u.  Pt agrees.      Relevant Orders   Ambulatory referral to Gastroenterology   Dysphagia    Persistent problems swallowing.  Saw GI.  Had cxr.  Needs f/u with GI.  Schedule.        Relevant Orders   Ambulatory referral to Gastroenterology   Hypercholesterolemia    Low cholesterol diet and exercise.  On lipitor.  Follow lipid panel and liver function tests.        Relevant Medications   amLODipine (NORVASC) 10 MG tablet   Hypertension    Blood pressure has been doing well.  Continue the same medication regimen.  Check met b.       Relevant Medications   amLODipine (NORVASC) 10 MG tablet   Osteoporosis - Primary    Recent bone density as outlined.  Discussed treatment options.  She wants to research the medication.  Follow.  Check vitamin D level.  Will let me know of her decision.        Relevant Orders   Vit D  25 hydroxy (rtn osteoporosis monitoring) (Completed)   Rectal bleeding    Persistent intermittent bleeding and exam as outlined.  Refer to Dr Pat Patrick for evaluation and treatment.        Relevant Orders   Ambulatory referral to General Surgery   Stress    Seeing Dr Nicolasa Ducking.  See her note for details.  Follow.        Other Visit Diagnoses    Hyperglycemia          I spent 25 minutes with the patient and more than 50% of the time was spent in consultation regarding the above.     Einar Pheasant, MD

## 2014-10-21 NOTE — Telephone Encounter (Signed)
Unread mychart message mailed to patient

## 2014-10-25 ENCOUNTER — Telehealth: Payer: Self-pay | Admitting: Internal Medicine

## 2014-10-25 NOTE — Telephone Encounter (Signed)
Please notify pt that this is the route that Dr Pat Patrick wanted her to go.  Thank.

## 2014-10-25 NOTE — Telephone Encounter (Signed)
Called Dr Rolin Barry office to schedule appointment to see Dr Pat Patrick for ?hemorroids  ,his nurse thought she should see GI first, referral already in place to see Dr Candace Cruise,  Please advise

## 2014-10-26 ENCOUNTER — Encounter: Payer: Self-pay | Admitting: Internal Medicine

## 2014-10-27 ENCOUNTER — Other Ambulatory Visit (INDEPENDENT_AMBULATORY_CARE_PROVIDER_SITE_OTHER): Payer: PPO | Admitting: *Deleted

## 2014-10-27 DIAGNOSIS — R351 Nocturia: Secondary | ICD-10-CM

## 2014-10-27 LAB — URINALYSIS, ROUTINE W REFLEX MICROSCOPIC
Bilirubin Urine: NEGATIVE
Hgb urine dipstick: NEGATIVE
Ketones, ur: NEGATIVE
NITRITE: NEGATIVE
SPECIFIC GRAVITY, URINE: 1.02 (ref 1.000–1.030)
Total Protein, Urine: NEGATIVE
URINE GLUCOSE: NEGATIVE
UROBILINOGEN UA: 0.2 (ref 0.0–1.0)
pH: 6 (ref 5.0–8.0)

## 2014-10-27 NOTE — Telephone Encounter (Signed)
Please advise.

## 2014-10-28 ENCOUNTER — Encounter: Payer: Self-pay | Admitting: Internal Medicine

## 2014-10-28 ENCOUNTER — Ambulatory Visit: Payer: Self-pay | Admitting: Gastroenterology

## 2014-11-01 ENCOUNTER — Encounter
Admit: 2014-11-01 | Disposition: A | Discharge status: Home / Self Care | Payer: Self-pay | Attending: Internal Medicine | Admitting: Internal Medicine

## 2014-11-03 ENCOUNTER — Other Ambulatory Visit: Payer: Self-pay | Admitting: Internal Medicine

## 2014-11-20 ENCOUNTER — Encounter: Payer: Self-pay | Admitting: Internal Medicine

## 2014-11-20 DIAGNOSIS — N898 Other specified noninflammatory disorders of vagina: Secondary | ICD-10-CM

## 2014-11-22 ENCOUNTER — Other Ambulatory Visit (INDEPENDENT_AMBULATORY_CARE_PROVIDER_SITE_OTHER): Payer: PPO

## 2014-11-22 ENCOUNTER — Telehealth: Payer: Self-pay | Admitting: *Deleted

## 2014-11-22 DIAGNOSIS — D649 Anemia, unspecified: Secondary | ICD-10-CM

## 2014-11-22 NOTE — Telephone Encounter (Signed)
Kimberly Roy already rescheduled the gyn appt.  If she is concerned about a possible uti, will need to be evaluated.  Labs addressed with carolyn.

## 2014-11-22 NOTE — Telephone Encounter (Signed)
Sent patient a mychart message in response to her message

## 2014-11-22 NOTE — Addendum Note (Signed)
Addended by: Karlene Einstein D on: 11/22/2014 04:34 PM   Modules accepted: Orders

## 2014-11-22 NOTE — Telephone Encounter (Signed)
Order for referral placed.

## 2014-11-22 NOTE — Telephone Encounter (Signed)
Did the pt say why she was here?  I can't find anything I have ordered.  I may be overlooking.  She is usually up on things.  If she did not say anything, do you mind calling her and seeing if she knows what she was coming in for - or notify Carolee Rota to call.  Let me know if any problems or if you cannot reach her.

## 2014-11-22 NOTE — Telephone Encounter (Signed)
Patient would like for Dr Nicki Reaper to call her

## 2014-11-22 NOTE — Telephone Encounter (Signed)
Labs and dx?

## 2014-11-22 NOTE — Telephone Encounter (Signed)
Called pt, she said that she thought since she has an appointment on the 29th she thought she needed blood work, and she said someone was suppose to call her about the referral for the OBGYN on where to go that it was today at 2:00pm and now she isnt goimg to be able to go since no one gave her a call back. And that she thinks  She has a bladder infection

## 2014-11-23 ENCOUNTER — Ambulatory Visit: Payer: PPO | Admitting: Pulmonary Disease

## 2014-11-23 LAB — CBC WITH DIFFERENTIAL/PLATELET
BASOS PCT: 0.4 % (ref 0.0–3.0)
Basophils Absolute: 0 10*3/uL (ref 0.0–0.1)
Eosinophils Absolute: 0.1 10*3/uL (ref 0.0–0.7)
Eosinophils Relative: 1.4 % (ref 0.0–5.0)
HCT: 34.4 % — ABNORMAL LOW (ref 36.0–46.0)
HEMOGLOBIN: 11.4 g/dL — AB (ref 12.0–15.0)
Lymphocytes Relative: 22.7 % (ref 12.0–46.0)
Lymphs Abs: 2.1 10*3/uL (ref 0.7–4.0)
MCHC: 33.2 g/dL (ref 30.0–36.0)
MCV: 95.4 fl (ref 78.0–100.0)
Monocytes Absolute: 0.3 10*3/uL (ref 0.1–1.0)
Monocytes Relative: 3.7 % (ref 3.0–12.0)
Neutro Abs: 6.5 10*3/uL (ref 1.4–7.7)
Neutrophils Relative %: 71.8 % (ref 43.0–77.0)
Platelets: 203 10*3/uL (ref 150.0–400.0)
RBC: 3.6 Mil/uL — AB (ref 3.87–5.11)
RDW: 14.5 % (ref 11.5–15.5)
WBC: 9.1 10*3/uL (ref 4.0–10.5)

## 2014-11-23 LAB — FERRITIN: Ferritin: 63.1 ng/mL (ref 10.0–291.0)

## 2014-11-26 ENCOUNTER — Encounter: Payer: Self-pay | Admitting: Internal Medicine

## 2014-11-29 ENCOUNTER — Encounter: Payer: Self-pay | Admitting: Internal Medicine

## 2014-11-29 ENCOUNTER — Ambulatory Visit (INDEPENDENT_AMBULATORY_CARE_PROVIDER_SITE_OTHER): Payer: PPO | Admitting: Internal Medicine

## 2014-11-29 VITALS — BP 174/78 | HR 73 | Temp 98.3°F | Ht 65.0 in | Wt 220.1 lb

## 2014-11-29 DIAGNOSIS — Z23 Encounter for immunization: Secondary | ICD-10-CM | POA: Diagnosis not present

## 2014-11-29 DIAGNOSIS — E039 Hypothyroidism, unspecified: Secondary | ICD-10-CM | POA: Diagnosis not present

## 2014-11-29 DIAGNOSIS — M25522 Pain in left elbow: Secondary | ICD-10-CM

## 2014-11-29 DIAGNOSIS — D649 Anemia, unspecified: Secondary | ICD-10-CM

## 2014-11-29 DIAGNOSIS — E669 Obesity, unspecified: Secondary | ICD-10-CM

## 2014-11-29 DIAGNOSIS — I270 Primary pulmonary hypertension: Secondary | ICD-10-CM | POA: Diagnosis not present

## 2014-11-29 DIAGNOSIS — Z658 Other specified problems related to psychosocial circumstances: Secondary | ICD-10-CM

## 2014-11-29 DIAGNOSIS — I10 Essential (primary) hypertension: Secondary | ICD-10-CM | POA: Diagnosis not present

## 2014-11-29 DIAGNOSIS — M7989 Other specified soft tissue disorders: Secondary | ICD-10-CM

## 2014-11-29 DIAGNOSIS — I272 Pulmonary hypertension, unspecified: Secondary | ICD-10-CM

## 2014-11-29 DIAGNOSIS — F439 Reaction to severe stress, unspecified: Secondary | ICD-10-CM

## 2014-11-29 DIAGNOSIS — E7800 Pure hypercholesterolemia, unspecified: Secondary | ICD-10-CM

## 2014-11-29 DIAGNOSIS — Z0000 Encounter for general adult medical examination without abnormal findings: Secondary | ICD-10-CM

## 2014-11-29 NOTE — Progress Notes (Signed)
Pre visit review using our clinic review tool, if applicable. No additional management support is needed unless otherwise documented below in the visit note.

## 2014-11-30 ENCOUNTER — Encounter: Payer: Self-pay | Admitting: Internal Medicine

## 2014-11-30 DIAGNOSIS — Z0000 Encounter for general adult medical examination without abnormal findings: Secondary | ICD-10-CM | POA: Insufficient documentation

## 2014-11-30 DIAGNOSIS — M25522 Pain in left elbow: Secondary | ICD-10-CM | POA: Insufficient documentation

## 2014-11-30 NOTE — Assessment & Plan Note (Signed)
On thyroid replacement.  Follow tsh.  

## 2014-11-30 NOTE — Assessment & Plan Note (Signed)
Seeing Dr Nicolasa Ducking.  Doing better.  Follow.

## 2014-11-30 NOTE — Assessment & Plan Note (Signed)
Has improved.  Continue compression hose.

## 2014-11-30 NOTE — Assessment & Plan Note (Signed)
Blood pressure on recheck improved.  Has been doing well.  Same medication regimen.  Follow pressures.  Follow metabolic panel.

## 2014-11-30 NOTE — Assessment & Plan Note (Signed)
Followed by Dr McQuaid.  Had sleep study.  CPAP.   

## 2014-11-30 NOTE — Assessment & Plan Note (Signed)
Diet and exercise.

## 2014-11-30 NOTE — Assessment & Plan Note (Signed)
Physical 02/23/14.  Mammogram 08/24/14 - Birads I.  Sees GI.

## 2014-11-30 NOTE — Assessment & Plan Note (Signed)
On lipitor.  Low cholesterol diet and exercise.  Follow lipid panel and liver function tests.   

## 2014-11-30 NOTE — Assessment & Plan Note (Signed)
Persistent intermittent left elbow pain.  Tender to palpation.  Avoid strenuous lifting, etc.  Tylenol.  Discussed evaluation by Dr Jefm Bryant.  May need injection.

## 2014-11-30 NOTE — Progress Notes (Signed)
Patient ID: Kimberly Roy, female   DOB: 12-24-1930, 81 y.o.   MRN: [id number]   Subjective:    Patient ID: Kimberly Roy, female    DOB: 26-Nov-1930, 81 y.o.   MRN: [id number]  HPI  Patient here for a scheduled follow up.  She states she feels some better.  No sob.  Breathing stable.  No urinary tract infection - symptoms.  Breathing stable.  No cardiac symptoms.  Eating and drinking well.  No vomiting.  No acid reflux.  Bowels stable.  She does report some left elbow pain.  Point tenderness.  No known injury or trauma.  No redness.  Is not a constant pain.  Swelling is better.  Takes lasix.  Does not take daily.     Past Medical History  Diagnosis Date  . Osteoarthritis     knees/cervical and lumbar spine  . Hypertension   . Hyperlipidemia   . Hypothyroidism   . Anemia   . Depression   . Anxiety   . Nephrolithiasis   . Interstitial cystitis   . Pulmonary nodules     followed by Dr Raul Del  . Fatty liver   . Renal cyst     right  . Pulmonary hypertension     Current Outpatient Prescriptions on File Prior to Visit  Medication Sig Dispense Refill  . amLODipine (NORVASC) 10 MG tablet Take 10 mg by mouth daily.    Marland Kitchen atorvastatin (LIPITOR) 20 MG tablet TAKE ONE TABLET AT BEDTIME 90 tablet 2  . benazepril (LOTENSIN) 40 MG tablet TAKE ONE (1) TABLET EACH DAY 90 tablet 1  . Calcium-Magnesium-Vitamin D 600-40-500 MG-MG-UNIT TB24 Take 630 mg by mouth 4 (four) times daily.    [name] Sodium 30-100 MG CAPS Take by mouth.    . DULoxetine (CYMBALTA) 30 MG capsule   0  . fluticasone (FLONASE) 50 MCG/ACT nasal spray Place 2 sprays into the nose daily.    . furosemide (LASIX) 20 MG tablet TAKE ONE (1) TABLET EACH DAY AS NEEDED 30 tablet 6  . gabapentin (NEURONTIN) 300 MG capsule Take 300 mg by mouth 3 (three) times daily.    . hydrochlorothiazide (HYDRODIURIL) 25 MG tablet TAKE ONE (1) TABLET EACH DAY 90 tablet 1  . HYDROcodone-acetaminophen (NORCO) 10-325 MG per tablet Take 1 tablet by  mouth every 4 (four) hours as needed.    . metoprolol succinate (TOPROL-XL) 100 MG 24 hr tablet TAKE ONE TABLET TWICE DAILY. TAKE WITH OR IMMEDIATELY FOLLOWING A MEAL 60 tablet 5  . Multiple Vitamins-Minerals (CENTRUM SILVER ADULT 50+ PO) Take by mouth daily.    Marland Kitchen nystatin cream (MYCOSTATIN) Apply topically 2 (two) times daily. 30 g 1  . pantoprazole (PROTONIX) 40 MG tablet Take 40 mg by mouth 2 (two) times daily.    . polyethylene glycol powder (GLYCOLAX/MIRALAX) powder MIX 17GM (AS MARKED IN BOTTLE TOP) IN 8 OUNCES OF WATER, MIX AND DRINK ONCE A DAY AS DIRECTED. 527 g 2  . sodium chloride (OCEAN) 0.65 % nasal spray Place 1 spray into the nose as needed.    Marland Kitchen SYNTHROID 112 MCG tablet TAKE ONE (1) TABLET EACH DAY 90 tablet 2  . zolpidem (AMBIEN) 10 MG tablet Take 10 mg by mouth at bedtime as needed for sleep.     No current facility-administered medications on file prior to visit.    Review of Systems  Constitutional: Negative for appetite change and unexpected weight change.  HENT: Negative for congestion and sinus pressure.   Respiratory: Negative  for cough, chest tightness and shortness of breath.   Cardiovascular: Positive for leg swelling (leg swelling is better.  ). Negative for chest pain and palpitations.  Gastrointestinal: Negative for nausea, vomiting, abdominal pain and diarrhea.  Genitourinary: Negative for dysuria and difficulty urinating.  Musculoskeletal: Positive for back pain (chronic back pain. ).       Increased pain - left elbow - with palpation.   Skin: Negative for color change and rash.  Neurological: Negative for dizziness, light-headedness and headaches.  Psychiatric/Behavioral: Negative for dysphoric mood and agitation.       Objective:     Blood pressure recheck:  134/64  Physical Exam  Constitutional: She appears well-developed and well-nourished. No distress.  HENT:  Nose: Nose normal.  Mouth/Throat: Oropharynx is clear and moist.  Neck: Neck supple.  No thyromegaly present.  Cardiovascular: Normal rate and regular rhythm.   Pulmonary/Chest: Breath sounds normal. No respiratory distress. She has no wheezes.  Abdominal: Soft. Bowel sounds are normal. There is no tenderness.  Musculoskeletal: She exhibits no edema or tenderness.  Increased tenderness to palpation over the left elbow.  No soft tissue swelling.  No increased erythema.   Lymphadenopathy:    She has no cervical adenopathy.    BP 174/78 mmHg  Pulse 73  Temp(Src) 98.3 F (36.8 C) (Oral)  Ht 5' 5" (1.651 m)  Wt 220 lb 2 oz (99.848 kg)  BMI 36.63 kg/m2  SpO2 95% Wt Readings from Last 3 Encounters:  11/29/14 220 lb 2 oz (99.848 kg)  10/19/14 227 lb 2 oz (103.023 kg)  08/30/14 224 lb 4 oz (101.719 kg)     Lab Results  Component Value Date   WBC 9.1 11/22/2014   HGB 11.4* 11/22/2014   HCT 34.4* 11/22/2014   PLT 203.0 11/22/2014   GLUCOSE 99 10/19/2014   CHOL 136 10/19/2014   TRIG 136.0 10/19/2014   HDL 36.50* 10/19/2014   LDLCALC 72 10/19/2014   ALT 13 10/19/2014   AST 19 10/19/2014   NA 140 10/19/2014   K 4.6 10/19/2014   CL 100 10/19/2014   CREATININE 0.74 10/19/2014   BUN 18 10/19/2014   CO2 34* 10/19/2014   TSH 1.99 02/21/2014   HGBA1C 6.0 10/19/2014       Assessment & Plan:   Problem List Items Addressed This Visit    Anemia    Has seen GI.  Last hgb stable.  Follow cbc.       Health care maintenance    Physical 02/23/14.  Mammogram 08/24/14 - Birads I.  Sees GI.       Hypercholesterolemia    On lipitor.  Low cholesterol diet and exercise.  Follow lipid panel and liver function tests.        Hypertension - Primary    Blood pressure on recheck improved.  Has been doing well.  Same medication regimen.  Follow pressures.  Follow metabolic panel.        Hypothyroidism    On thyroid replacement.  Follow tsh.       Left elbow pain    Persistent intermittent left elbow pain.  Tender to palpation.  Avoid strenuous lifting, etc.  Tylenol.   Discussed evaluation by Dr Jefm Bryant.  May need injection.        Leg swelling    Has improved.  Continue compression hose.       Obesity (BMI 30-39.9)    Diet and exercise.        Pulmonary hypertension  Followed by Dr [place] Bells.  Had sleep study.  CPAP.       Stress    Seeing Dr Nicolasa Ducking.  Doing better.  Follow.        Other Visit Diagnoses    Need for prophylactic vaccination against Streptococcus pneumoniae (pneumococcus)        Relevant Orders    Pneumococcal conjugate vaccine 13-valent (Completed)      I spent 25 minutes with the patient and more than 50% of the time was spent in consultation regarding the above.     Einar Pheasant, MD

## 2014-11-30 NOTE — Assessment & Plan Note (Signed)
Has seen GI.  Last hgb stable.  Follow cbc.

## 2014-12-02 ENCOUNTER — Encounter
Admit: 2014-12-02 | Disposition: A | Discharge status: Home / Self Care | Payer: Self-pay | Attending: Internal Medicine | Admitting: Internal Medicine

## 2014-12-11 ENCOUNTER — Encounter: Payer: Self-pay | Admitting: Internal Medicine

## 2014-12-12 ENCOUNTER — Ambulatory Visit (INDEPENDENT_AMBULATORY_CARE_PROVIDER_SITE_OTHER): Payer: PPO | Admitting: Internal Medicine

## 2014-12-12 ENCOUNTER — Encounter: Payer: Self-pay | Admitting: Internal Medicine

## 2014-12-12 VITALS — BP 159/67 | HR 71 | Temp 97.7°F | Ht 65.0 in | Wt 220.4 lb

## 2014-12-12 DIAGNOSIS — N3000 Acute cystitis without hematuria: Secondary | ICD-10-CM | POA: Diagnosis not present

## 2014-12-12 MED ORDER — METOPROLOL SUCCINATE ER 100 MG PO TB24: ORAL_TABLET | ORAL | Status: DC

## 2014-12-12 MED ORDER — CIPROFLOXACIN HCL 500 MG PO TABS: 500.0000 mg | ORAL_TABLET | Freq: Two times a day (BID) | ORAL | Status: DC

## 2014-12-12 NOTE — Patient Instructions (Signed)
Urinary Tract Infection Urinary tract infections (UTIs) can develop anywhere along your urinary tract. Your urinary tract is your body's drainage system for removing wastes and extra water. Your urinary tract includes two kidneys, two ureters, a bladder, and a urethra. Your kidneys are a pair of bean-shaped organs. Each kidney is about the size of your fist. They are located below your ribs, one on each side of your spine. CAUSES Infections are caused by microbes, which are microscopic organisms, including fungi, viruses, and bacteria. These organisms are so small that they can only be seen through a microscope. Bacteria are the microbes that most commonly cause UTIs. SYMPTOMS  Symptoms of UTIs may vary by age and gender of the patient and by the location of the infection. Symptoms in young women typically include a frequent and intense urge to urinate and a painful, burning feeling in the bladder or urethra during urination. Older women and men are more likely to be tired, shaky, and weak and have muscle aches and abdominal pain. A fever may mean the infection is in your kidneys. Other symptoms of a kidney infection include pain in your back or sides below the ribs, nausea, and vomiting. DIAGNOSIS To diagnose a UTI, your caregiver will ask you about your symptoms. Your caregiver also will ask to provide a urine sample. The urine sample will be tested for bacteria and white blood cells. White blood cells are made by your body to help fight infection. TREATMENT  Typically, UTIs can be treated with medication. Because most UTIs are caused by a bacterial infection, they usually can be treated with the use of antibiotics. The choice of antibiotic and length of treatment depend on your symptoms and the type of bacteria causing your infection. HOME CARE INSTRUCTIONS  If you were prescribed antibiotics, take them exactly as your caregiver instructs you. Finish the medication even if you feel better after you  have only taken some of the medication.  Drink enough water and fluids to keep your urine clear or pale yellow.  Avoid caffeine, tea, and carbonated beverages. They tend to irritate your bladder.  Empty your bladder often. Avoid holding urine for long periods of time.  Empty your bladder before and after sexual intercourse.  After a bowel movement, women should cleanse from front to back. Use each tissue only once. SEEK MEDICAL CARE IF:   You have back pain.  You develop a fever.  Your symptoms do not begin to resolve within 3 days. SEEK IMMEDIATE MEDICAL CARE IF:   You have severe back pain or lower abdominal pain.  You develop chills.  You have nausea or vomiting.  You have continued burning or discomfort with urination. MAKE SURE YOU:   Understand these instructions.  Will watch your condition.  Will get help right away if you are not doing well or get worse. Document Released: 05/29/2005 Document Revised: 02/18/2012 Document Reviewed: 09/27/2011 [employer] [facility] [employer] Patient Information 2015 [place], [place]. This information is not intended to replace advice given to you by your health care provider. Make sure you discuss any questions you have with your health care provider.

## 2014-12-12 NOTE — Telephone Encounter (Signed)
I saw Kimberly Roy in the office.  Dr Gilford Rile saw Kimberly Roy.

## 2014-12-12 NOTE — Progress Notes (Signed)
   Subjective:    Patient ID: Kimberly Roy, female    DOB: 1931-08-14, 81 y.o.   MRN: [id number]  HPI  81YO female presents for acute visit.  Dysuria - Notes burning with urination and urinary frequency. No blood in urine. Some chronic back pain. No fever, chills. Symptoms started 2 days ago. Not taking anything for this.  Past medical, surgical, family and social history per today's encounter.  Review of Systems  Constitutional: Negative for fever, chills and fatigue.  Gastrointestinal: Negative for nausea, vomiting, abdominal pain, diarrhea, constipation and rectal pain.  Genitourinary: Positive for dysuria, urgency and frequency. Negative for hematuria, flank pain, decreased urine volume, vaginal bleeding, vaginal discharge, difficulty urinating, vaginal pain and pelvic pain.       Objective:    BP 159/67 mmHg  Pulse 71  Temp(Src) 97.7 F (36.5 C) (Oral)  Ht _0  (1.651 m)  Wt 220 lb 6 oz (99.961 kg)  BMI 36.67 kg/m2  SpO2 94% Physical Exam  Constitutional: She is oriented to person, place, and time. She appears well-developed and well-nourished. No distress.  HENT:  Head: Normocephalic and atraumatic.  Right Ear: External ear normal.  Left Ear: External ear normal.  Nose: Nose normal.  Mouth/Throat: Oropharynx is clear and moist.  Eyes: Conjunctivae are normal. Pupils are equal, round, and reactive to light. Right eye exhibits no discharge. Left eye exhibits no discharge. No scleral icterus.  Neck: Normal range of motion. Neck supple. No tracheal deviation present. No thyromegaly present.  Cardiovascular: Normal rate, regular rhythm, normal heart sounds and intact distal pulses.  Exam reveals no gallop and no friction rub.   No murmur heard. Pulmonary/Chest: Effort normal and breath sounds normal. No respiratory distress. She has no wheezes. She has no rales. She exhibits no tenderness.  Abdominal: There is no tenderness (no CVA tenderness).  Musculoskeletal: Normal range  of motion. She exhibits no edema or tenderness.  Lymphadenopathy:    She has no cervical adenopathy.  Neurological: She is alert and oriented to person, place, and time. No cranial nerve deficit. She exhibits normal muscle tone. Coordination normal.  Skin: Skin is warm and dry. No rash noted. She is not diaphoretic. No erythema. No pallor.  Psychiatric: She has a normal mood and affect. Her behavior is normal. Judgment and thought content normal.          Assessment & Plan:   Problem List Items Addressed This Visit      Unprioritized   Acute cystitis without hematuria - Primary    Symptoms c/w acute cystitis. She was unable to provide a urine sample at the time of the visit, but will try to give a urine sample later today for UA and urinalysis. Start Cipro 531m po bid x 14 days. Follow up prn.      Relevant Medications   ciprofloxacin (CIPRO) tablet   Other Relevant Orders   CULTURE, URINE COMPREHENSIVE   POCT Urinalysis Dipstick       Return if symptoms worsen or fail to improve.

## 2014-12-12 NOTE — Progress Notes (Signed)
Pre visit review using our clinic review tool, if applicable. No additional management support is needed unless otherwise documented below in the visit note.

## 2014-12-12 NOTE — Assessment & Plan Note (Addendum)
Symptoms c/w acute cystitis. She was unable to provide a urine sample at the time of the visit, but will try to give a urine sample later today for UA and urinalysis. Start Cipro 570m po bid x 14 days. Follow up prn.

## 2014-12-15 ENCOUNTER — Encounter: Payer: Self-pay | Admitting: Internal Medicine

## 2014-12-15 ENCOUNTER — Telehealth: Payer: Self-pay

## 2014-12-15 NOTE — Telephone Encounter (Signed)
The pt called and stated her UTI symptoms were not better.  She states she is still feeling pain and discomfort.  She was transferred to triage, but refused to speak with anyone aside from [place].

## 2014-12-15 NOTE — Telephone Encounter (Signed)
Really need a urine sample to know for sure if infection.  (ok to bring one in - since has been seen).  May be her interstitial cystitis.  I would also recommend her notify Dr Amalia Hailey office.

## 2014-12-15 NOTE — Telephone Encounter (Signed)
Pt states that she is on an antibiotic now so she doesn't feel that a urine would be accurate. She also states that she is seeing Dr. Amalia Hailey next week. She was unclear if she was suppose to bring a urine back after seeing Dr. Gilford Rile the other day but figured it wouldn't show anything since she picked up the antibiotic & started on it right away. Please advise

## 2014-12-16 ENCOUNTER — Other Ambulatory Visit: Payer: Self-pay | Admitting: *Deleted

## 2014-12-16 DIAGNOSIS — Z139 Encounter for screening, unspecified: Secondary | ICD-10-CM

## 2014-12-16 LAB — POCT URINALYSIS DIPSTICK
BILIRUBIN UA: NEGATIVE
Glucose, UA: NEGATIVE
Ketones, UA: NEGATIVE
Leukocytes, UA: NEGATIVE
Nitrite, UA: NEGATIVE
Protein, UA: NEGATIVE
RBC UA: NEGATIVE
SPEC GRAV UA: 1.015
Urobilinogen, UA: 0.2
pH, UA: 5

## 2014-12-16 NOTE — Telephone Encounter (Signed)
Pt notified & states that her pain in in the lower pelvic area. Will bring in urine.

## 2014-12-16 NOTE — Telephone Encounter (Signed)
I would still like for her to submit a urine sample.  If resistant to cipro, may still show if infection.  If no infection, may be her interstitial cystitis.  Keep appt with Dr Amalia Hailey.  If problems, will need to be reevaluated.

## 2014-12-18 LAB — URINE CULTURE
COLONY COUNT: NO GROWTH
Organism ID, Bacteria: NO GROWTH

## 2014-12-19 ENCOUNTER — Encounter: Payer: Self-pay | Admitting: Internal Medicine

## 2014-12-20 NOTE — Discharge Summary (Signed)
  Duanne Guess PA ELECTRONICALLY SIGNED 04/30/2012 6:46

## 2014-12-20 NOTE — Discharge Summary (Signed)
PATIENT NAME:  Kimberly Roy, Kimberly Roy MR#:  [id number] DATE OF BIRTH:  1930-11-12  DATE OF ADMISSION:  04/09/2012 DATE OF DISCHARGE:  04/14/2012  ADMITTING DIAGNOSIS: Right proximal femur nonunion.   DISCHARGE DIAGNOSIS: Right proximal femur nonunion.   PROCEDURE: Removal of deep hardware, right femur, open reduction internal fixation with plate and screws, right proximal femur.   SURGEON: Laurene Footman, M.D.   ANESTHESIA: General.   ESTIMATED BLOOD LOSS: 1000 mL related to lateral approach to the proximal femur as well as bleeding.   IMPLANTS: 4.5 mm LCP proximal femur hook plate with six holes with multiple locking screws and cortical screw.   CONDITION: To recovery room stable.   COMPLICATIONS: None. No evidence of infection.   SPECIMEN: None.   HISTORY: HISTORY: The patient is a 81 year old female who had undergone a right hip subtrochanteric fracture with open reduction and internal fixation by Dr. Rudene Christians on 08/27/2011. She did not progress well with physical therapy due to significant pain. X-rays taken at follow-up appointment showed the patient had very minimal callus formation. She was diagnosed with a nonunion. Despite bone stimulator, the patient was unable to have healing within the right hip. In June 2013 the patient had a CT scan of the hip, which confirmed a nonunion. She continued to have persistent pain in the groin as well as pain with rotation and right lateral hip pain.   PHYSICAL EXAMINATION:  GENERAL: Well developed, well nourished, obese female in no apparent distress. She presents in a wheelchair. HEENT: Head is normocephalic, atraumatic. Pupils equal, round, and reactive to light. HEART: Regular rate and rhythm. There is no murmur. There is normal apical pulse. LUNGS: Clear to auscultation. There is no wheeze, rhonchi, or crackles. There is normal expansion of bilateral chest walls. RIGHT LOWER EXTREMITY: Examination of the of the right hip shows the patient has stiffness on  rotation. She is mildly tender over the lateral hip joint. She has some groin pain with internal and external rotation. There is approximately 1 inch of shortening on the right compared to the left.   HOSPITAL COURSE: The patient was admitted to the hospital on 04/09/2012. She had surgery that same day was brought to the orthopedic floor from the PAC-U in stable condition. The patient 08/09 through 08/11 received physical therapy. She had slow progress with physical therapy. On  04/12/2012 hemoglobin was ordered and the patient had dropped down to 7. The patient was given 1 unit and hemoglobin was rechecked the next day on 04/13/2012 and was only brought up to 7.7. The patient then was given one more additional unit of packed red blood cells which on 04/14/2012 brought her hemoglobin up to 8.6. On 04/14/2012 the patient was stable and ready for discharge. She will be going to [facility] facility.   DISCHARGE INSTRUCTIONS:  1. The patient will follow up at [facility] in two weeks for staple removal.  2. She will be evaluated and treated for difficulty walking with right partial weight-bearing.  3. She will resume a regular diet.  4. She will wear TED hose knee-highs on bilateral knees, take off at nighttime.  5. She is partial weight-bearing.  6. The patient is allowed to adjust her spinal cord stimulator per Dr. Rudene Christians.   DISCHARGE MEDICATIONS:  1. Pantoprazole 40 mg oral b.i.d.  2. Levothyroxine 0.1 mg oral q. 6 a.m.  3. Mometasone furoate nasal spray, one spray both nostrils daily. 4. Polyethylene glycol powder 17 grams oral daily p.r.n. constipation.  5. Metoprolol 100 mg oral daily.  6. Atorvastatin 20 mg oral daily.  7. HCTZ 25 mg oral every morning.  8. Furosemide 20 mg oral daily.  9. Lotensin 40 mg oral daily.  10. Tylenol 500 to 1,000 mg oral q. 4 hours. 11. Senokot 1 tablet oral b.i.d.  12. Milk of magnesia 30 mL oral b.i.d. p.r.n. constipation.  13. Dulcolax  suppository 10 mg rectal daily p.r.n. constipation.  14. Aspirin 325 mg oral daily.  15. Zolpidem 10 mg oral at bedtime p.r.n. insomnia.  16. Amlodipine 10 mg oral daily.  17. Morphine ER tablet 100 mg oral q. 8 h. 18. Oxycodone 5 to 10 mg oral q. 3 h. p.r.n. for pain. 19. Nystatin, apply to affected area t.i.d.    ____________________________ Duanne Guess, PA-C tcg:bjt D: 04/14/2012 08:23:59 ET T: 04/14/2012 09:10:18 ET JOB#: [id number]  cc: Duanne Guess, PA-C, <Dictator>  ____________________________ Duanne Guess, PA-C tcg:cms D: 04/14/2012 08:17:48 ET T: 04/14/2012 08:51:19 ET JOB#: [id number] cc: Duanne Guess, PA-C, <Dictator> Duanne Guess [place] ELECTRONICALLY SIGNED 04/15/2012 12:02

## 2014-12-20 NOTE — Op Note (Signed)
PATIENT NAME:  Kimberly Roy, Kimberly Roy MR#:  [id number] DATE OF BIRTH:  [birth year]  DATE OF PROCEDURE:  04/09/2012  PREOPERATIVE DIAGNOSIS: Right proximal femur nonunion.   POSTOPERATIVE DIAGNOSIS: Right proximal femur nonunion.   PROCEDURE:  Removal of deep hardware, right femur, open reduction and internal fixation with plate and screws, right proximal femur.   SURGEON: Laurene Footman, M.D.   ANESTHESIA: General.  DESCRIPTION OF PROCEDURE: The patient was brought to the Operating Room and after adequate anesthesia was obtained, the patient was placed on the fracture table and the hip rubbed in the barrier drape method. After patient identification and time-out procedures were carried out, a distal incision was made over the distal screw with C-arm being used to help remove this. Distal screw was removed from the prior Affixus rod. Going proximally, an incision was made directly over the femur. Direct lateral approach made, first to the greater trochanter and then to the lag screw from the prior rod. Hemostasis was achieved with electrocautery. After exposure of the lateral hardware from the lag screw and D[place]-Miles cable, a proximal femur was exposed with difficulty being obtained to loosen the set screw secondary to scar tissue. Subsequently, a reamer or guidewire was used and the set screw was loosened off the lag screw. The lag screw was removed and the rod subsequently removed. This was with some difficulty secondary to scar tissue. After removal of the plate and hardware, a proximal femur hook plate six hole from [employer] was applied and when it was in the appropriate position on both AP and lateral projections, guidewires were placed for the screw fixation with a 7.3 cannulated screw proximally with locking and a 5.0 cannulated conical screw placed locking to the plate. These gave proximal fixation and then a whirlybird attachment was used to bring the plate to the shaft. Proximal cortical screw inserted  followed by multiple locking screws using standard technique, drilling, measuring and placing the screws. After all these screws have been placed, the fracture appeared stably reduced. After the lag screw had been removed, 20 mL of cancellus bone graft was impacted up into this screw hole to help aid in healing as well as to prevent migration into this area by the new hardware. After a thorough irrigation of the wound, the IT band was repaired using a running heavy quill suture along with the abductors at the level of the trochanter. A 0 quill was placed subcutaneously followed by skin staples. A wound VAC was applied to minimize hematoma formation. Sterile dressing applied. Staples were used on the distal incision with Xeroform, 4 x 4's, and ABD.   ESTIMATED BLOOD LOSS: 1000 mL related to the lateral approach to the proximal femur as well as bone bleeding.   IMPLANTS: 4.5 mm LCP proximal femur hook plate six hole with multiple locking screws and cortical screw.   CONDITION: To recovery room stable.   COMPLICATIONS: None. No evidence of infection.   SPECIMEN: None.    ____________________________ Laurene Footman, MD mjm:ap D: 04/09/2012 20:39:12 ET T: 04/10/2012 09:55:08 ET JOB#: [id number]  cc: Laurene Footman, MD, <Dictator>  Laurene Footman MD ELECTRONICALLY SIGNED 04/10/2012 12:27

## 2014-12-21 ENCOUNTER — Ambulatory Visit: Payer: PPO | Admitting: Pulmonary Disease

## 2014-12-24 NOTE — Op Note (Signed)
PATIENT NAME:  Kimberly Roy, Kimberly Roy MR#:  [id number] DATE OF BIRTH:  08-Feb-1931  DATE OF PROCEDURE:  12/22/2013  PREOPERATIVE DIAGNOSIS: Degenerative arthrosis of the left knee.   POSTOPERATIVE DIAGNOSIS: Degenerative arthrosis of the left knee.   PROCEDURE PERFORMED: Left total knee arthroplasty using computer-assisted navigation.   SURGEON: Laurice Record. Hooten, M.D.   ASSISTANT: Vance Peper, PA (required to maintain retraction throughout the procedure).   ANESTHESIA: General.   ESTIMATED BLOOD LOSS: 200 mL.   FLUIDS REPLACED: 2000 mL of crystalloid.   TOURNIQUET TIME: 98 minutes.   DRAINS: Two medium drains to reinfusion system.   SOFT TISSUE RELEASES: Anterior cruciate ligament, posterior cruciate ligament, deep medial collateral ligament and patellofemoral ligament.   IMPLANTS UTILIZED: DePuy PFC Sigma size 3 posterior stabilized femoral component (cemented), size 3 MBT tibial component (cemented), 35 mm three peg oval dome patella (cemented), and a 10 mm stabilized rotating platform polyethylene insert. Gentamicin bone cement was utilized due to the patient's positive MRSA nasal swab and previous history of MRSA infection.   INDICATIONS FOR SURGERY: The patient is an 81 year old female who has been seen for complaints of progressive left knee pain. She had a greater than 20 degree flexion contracture, and x-rays demonstrated severe degenerative changes in tricompartmental fashion. After discussion of the risks and benefits of surgical intervention, the patient expressed understanding of the risks, benefits, and agreed with plans for surgical intervention.   PROCEDURE IN DETAIL: The patient was brought to the Operating Room and, after adequate general anesthesia was achieved, a tourniquet was placed on the patient's upper left thigh. The patient's left knee and leg were cleaned and prepped with alcohol and DuraPrep, draped in the usual sterile fashion. A "timeout" was performed as per usual  protocol. The left lower extremity was exsanguinated using an Esmarch, and the tourniquet was inflated to 300 mmHg. An anterior longitudinal incision was made, followed by a standard mid vastus approach. Moderate effusion was evacuated. Deep fibers of the medial collateral ligament were elevated in a subperiosteal fashion off the medial flare of the tibia so as to maintain a continuous soft tissue sleeve. The patella was subluxed laterally and the patellofemoral ligament was incised.   Inspection of the left knee demonstrated severe degenerative changes in tricompartmental fashion. Prominent osteophytes were debrided using a rongeur. Anterior and posterior cruciate ligaments were excised. Two 4.0 mm Schanz pins were inserted into the femur and into the tibia for attachment of the array of trackers used for computer-assisted navigation. Hip center was identified using circumduction technique. Distal landmarks were mapped using the computer. The distal femur and proximal tibia were mapped using the computer. Distal femoral cutting guide was positioned using computer-assisted navigation so as to achieve a 5 degree distal valgus cut. Cut was performed and verified using the computer.   Distal femur was sized, and it was felt that a size 3 femoral component was appropriate. A size 3 cutting guide was positioned, and the anterior cut was performed and verified using the computer. This was followed by completion of the posterior and chamfer cuts. Femoral cutting guide for a central box was then positioned, and the central box cut was performed. Attention was then directed to the proximal tibia. Medial and lateral menisci were excised. The extramedullary tibial cutting guide was positioned using computer-assisted navigation so as to achieve 0 degree varus valgus alignment and 0 degree posterior slope. Cut was performed and verified using the computer.   The proximal tibia was sized, and it  was felt that a size 3  tibial tray was appropriate. Femoral and tibial trials were placed. Posterior osteophytes were debrided using a rongeur and osteotome. A 10 mm polyethylene trial was inserted, and the knee was placed through a range of motion. The knee was felt to be tight, both in flexion and in extension. Trial components were removed, and the extramedullary tibial cutting guide was positioned so as remove an additional 2 mm of bone. Cut was performed and verified using the computer, and trial components were repositioned. A 10 mm polyethylene trial was inserted and the knee was placed through a range of motion with excellent mediolateral and soft tissue balancing appreciated. Finally, the patella was cut and prepared so as to accommodate a 35 mm three-peg oval dome patella. Patellar trial was placed and the knee was placed through range of motion, with excellent patellar tracking appreciated. The femoral trial was removed. Central post hole for the tibial component was reamed followed by insertion of a keel punch. The cut surfaces of bone were irrigated with copious amounts of normal saline with antibiotic solution using pulsatile lavage, and then suctioned dry. Polymethyl methacrylate cement with gentamicin was prepared in the usual fashion using a vacuum mixer. Cement was applied to the cut surface of the proximal tibia as well as along the undersurface of the size 3 sports MBT tibial component. The tibial component was positioned and impacted into place. Excess cement was removed using [employer] fast streamer.   Cement was applied to the cut surface of the femur as well as along the posterior flanges of the size 3 posterior stabilized femoral component. Femoral component was positioned and impacted into place. Excess cement was removed using [employer] fast streamer. A 10 mm polyethylene trial was inserted and the knee was brought in full extension with steady axial compression applied. Finally, cement was applied to the backside of a 35 mm  three-peg oval dome patella, and the patellar component was positioned and patellar clamp applied. Excess cement was removed using [employer] fast streamer. After adequate curing of the cement, the tourniquet was deflated after a total tourniquet time of 98 minutes. Hemostasis was achieved using electrocautery.   The knee was irrigated with copious amounts of normal saline with antibiotic solution using pulsatile lavage, and then suctioned dry. Good hemostasis was appreciated. The knee was inspected for any residual cement debris. 20 mL of 1.3% Exparel and 40 mL of normal saline was injected along the posterior capsule, medial and lateral gutters, and along the arthrotomy site. A 10 mm stabilized rotating platform polyethylene insert was inserted and the knee was placed through a range of motion with excellent patellar tracking appreciated and excellent mediolateral soft tissue balance appreciated, both in full extension and in flexion. Two medium drains were placed in the wound bed and brought out through a separate stab incision to be attached to a reinfusion system.   The medial parapatellar portion of the incision was reapproximated using interrupted sutures of #1 Vicryl. The subcutaneous tissue was approximated in layers using first #0 Vicryl followed by 2-0 Vicryl. Then, 30 mL of 0.25% Marcaine with epinephrine was injected along the subcutaneous tissue in line with the skin incision. Skin edges were then reapproximated using skin staples. A sterile dressing was applied. The patient tolerated the procedure well. She was transported to the recovery room in stable condition.    ____________________________ Laurice Record. Holley Bouche., MD jph:cg D: 12/23/2013 00:14:29 ET T: 12/23/2013 00:44:05 ET JOB#: [id number]  cc: Laurice Record. Hooten Jr.,  MD, <Dictator> Marciano Sequin MD ELECTRONICALLY SIGNED 12/25/2013 18:19

## 2014-12-24 NOTE — Discharge Summary (Signed)
PATIENT NAME:  Kimberly Roy, Kimberly Roy MR#:  [id number] DATE OF BIRTH:  03/22/1931  DATE OF ADMISSION:  12/22/2013 DATE OF DISCHARGE: 12/25/2013   DICTATING FOR: Jeneen Rinks P. Holley Bouche., MD  ADMITTING DIAGNOSIS: Degenerative arthrosis of the left knee.   DISCHARGE DIAGNOSIS: Degenerative arthrosis of the left knee.   HISTORY: The patient is an 81 year old who has been followed at [facility] with a long history of progressive left knee pain. She had not seen any significant improvement in her condition despite cortisone injections as well as a previous series of Synvisc injections. She has stated that the pain was aggravated with weight-bearing activities. She had actually gone to using a wheelchair a lot for any long distance for ambulation. She stated that her walking had been significantly restricted. She did report some swelling of the knee as well as also appreciated some significant restriction in range of motion. Her pain had increased to the point that it was significantly interfering with her activities of daily living. X-rays taken in [facility] showed narrowing of the medial cartilage space with associated varus alignment. Osteophyte as well as subchondral sclerosis were noted. There was noted to be significant changes to the patellofemoral articulation. After discussion of the risks and benefits of surgical intervention, the patient expressed her understanding of the risks and benefits and agreed for plans for surgical intervention.   The patient does have a significant chronic pain syndrome for which she takes MS Contin, morphine and hydrocodone prior to this surgery.   PROCEDURE: Left total knee arthroplasty using computer-assisted navigation.   ANESTHESIA: General.   SOFT TISSUE RELEASE: Anterior cruciate ligament, posterior cruciate ligament, deep medial collateral ligaments as well as patellofemoral ligament.   IMPLANTS UTILIZED: DePuy PFC Sigma size 3 posterior stabilized  femoral component (cemented), size 3 MBT tibial component (cemented), 35 mm 3 pegged oval dome patella (cemented), and a 10 mm stabilized rotating platform polyethylene insert. Gentamicin bone cement was utilized due to a past history of MRSA, but had a negative nasal swab prior to admission.   HOSPITAL COURSE: The patient tolerated the procedure very well. She had no complications. She was then taken to the PACU, where she was stabilized and then transferred to the orthopedic floor. She began receiving anticoagulation therapy of Lovenox 40 mg subcutaneous q.12 hours per anesthesia and pharmacy protocol. She was fitted with TED stockings bilaterally. These were allowed to be removed 1 hour per 8-hour shift. The left one was applied on day 2 following removal of the Hemovac and dressing change. The patient was also fitted with AVI compression foot pumps bilaterally set at 80 mmHg. Her calves have been nontender. There has been no evidence of any DVTs. Negative Homans sign. Heels were elevated off the bed using rolled towels.   The patient has denied any chest pains or shortness of breath. Her vital signs have been stable. She has been afebrile. Hemodynamically, no transfusions were given other than the Autovac transfusion given the first 6 hours postoperatively. On day of transfer, her hemoglobin was 8.0.   Physical therapy was initiated on day 1 for gait training and transfers. She did fairly well the first day. She had range of motion of 0 to 75 and walking 40 feet. However, she has been taking significant increased narcotics, and subsequently, her ambulation has not been all that great. Upon transfer, she states that she did not feel good. She was noted to be quite sedated on day 2 to the point that she  had difficulty keeping her eyes open, but on day of transfer, she was able keep her eyes open and was eating a very  good breakfast and appeared to being doing better even though she said she felt worse.  Occupational therapy was also initiated on day 1 for ADLs and assistive devices. Overall, she has done well other than deconditioning and weakness.   The patient's IV, Foley and Hemovac were discontinued on day 2 along with a dressing change. The wound was free of any drainage or any signs of infection. The Polar Care was reapplied to the surgical leg, maintaining a temperature of 40 to 50 degrees Fahrenheit.   DISPOSITION: The patient is being discharged to a skilled nursing facility in improved stable condition.   DISCHARGE INSTRUCTIONS:  1. She may weight bear as tolerated. Continue PT for gait training and transfers. OT for ADLs and assistive devices.  2. Elevate the heels off the bed using rolled towels. Be sure to keep the foot straight up so that we can keep the leg in full extension.  3. TED stockings are to be worn around the clock, but may be removed 1 hour per 8-hour shift. The patient has her own support stockings at home that are supposedly more conforming to her legs, and she may wear these if she so desires.  4. Encourage the patient using incentive spirometer q.1 hour while awake. Encourage cough and deep breathing q.2 hours while awake.  5. She is placed on an ADA diet.  6. Continue with Polar Care, maintaining a temperature of 40 to 50 degrees Fahrenheit.  7. She was instructed not to get the dressing or bandage wet.  8. She has a followup appointment with [facility] on May 5th. At that time, will remove the staples. Change dressing in the meantime as needed.  9. She will continue PT for gait training and transfers and OT for ADLs and assistive devices.    DRUG ALLERGIES: BIAXIN, LEVAQUIN, LYRICA, NUCYNTA AND SULFA DRUGS.   MEDICATIONS:  1. Amitriptyline 25 mg at bedtime.  2. Norvasc 10 mg q.a.m.  3. Atorvastatin 20 mg at bedtime.  4. Lotensin 40 mg q.a.m. 5. Calcium carbonate 2 tablets b.i.d. breakfast and lunch. 6. Gabapentin 300 mg t.i.d. 7. Senokot-S 1 tablet  b.i.d.  8. Fluticasone spray 2 sprays to both nostrils daily.  9. Hydrochlorothiazide 25 mg daily. 10. Metoprolol ER 100 mg b.i.d. 11. MS Contin 100 mg q.8 hours.  12. Multivitamin capsule 1 tablet daily.  13. Nystatin topical powder applied to affected area b.i.d. 14. Pantoprazole 40 mg b.i.d. 15. MiraLax 17 grams daily p.r.n. 16. Lovenox 40 mg subcutaneous q.12 hours for 14 days, then discontinue and begin taking 1 enteric-coated aspirin as needed unless contraindicated.  17. Fosamax 70 mg weekly. 18. Tylenol ES 500 to 1000 mg q.4-6 hours p.r.n. for temperatures of greater than 100.4. 19. Mylanta DS 30 mL q.6 hours p.r.n. 20. Dulcolax suppositories 10 mg rectally p.r.n. for constipation.  21. Lasix 20 mg daily p.r.n. 22. Milk of Magnesia 30 mL b.i.d. p.r.n.  23. Skelaxin 800 mg q.8 hours p.r.n. 24. Oxycodone 5 to 10 mg q.4-6 hours p.r.n.  25. Tramadol 50 to 100 mg q.4-6 hours p.r.n.  26. Enema soapsuds if no results with Milk of Magnesia or Dulcolax.  Recommend trying to discontinue the oxycodone and tramadol as soon as possible since she has chronic pain syndrome and seemed to be a little sedated. I did discuss this with the patient that she  needed to back off on the narcotics since this was interfering with her rehab program.   PAST MEDICAL HISTORY: History of anemia, migraines, hypertension, hypercholesterolemia, depression/anxiety, history of DVTs, fatty liver, osteoporosis, degenerative disk disease, cataracts, hemorrhoids, hypothyroidism, pulmonary hypertension.   ____________________________ Vance Peper, PA jrw:lb D: 12/25/2013 09:33:00 ET T: 12/25/2013 09:47:49 ET JOB#: [id number]  cc: Vance Peper, PA, <Dictator> JON WOLFE PA ELECTRONICALLY SIGNED 12/28/2013 20:04

## 2014-12-25 NOTE — Consult Note (Signed)
PATIENT NAME:  Kimberly Roy, Kimberly Roy MR#:  [id number] DATE OF BIRTH:  1931/06/03  DATE OF CONSULTATION:  08/28/2011  REFERRING PHYSICIAN:  Laurene Footman, MD CONSULTING PHYSICIAN:  Loreli Dollar, MD  HISTORY OF PRESENT ILLNESS: This 81 year old female was referred for general surgery consultation with the complaint of abdominal distention. She had a fall two days ago in which she broke her right hip. She underwent surgery yesterday morning with insertion of a rod and also screws and a wire. She reports that while in the hospital she has had some nausea and also spit up a scant bit of bilious fluid, however has not had any type of regurgitation today. She reports that yesterday her abdomen was somewhat full and felt firm, but does feel some better today. She has taking small amounts of clear liquids today which she tolerated, but did have somewhat early satiety. She reports she does have a history of constipation which has been related to chronic need for narcotics. She has been taking MiraLax regularly, which seems to help keep her stools soft. She has in the last two years had some frequent burping and this seems to have been somewhat worse in the last few months. Does have a history of gastroesophageal reflux. History of heartburn and has been chronically taking Protonix which helps to keep her from having heartburn.   She reports stable weight. She reports no chills or fever. She has had a dry cough in recent days but not a productive cough.   PAST MEDICAL HISTORY: Her past medical history was reviewed and this does include hypertension. There is no specific history of heart disease. She has a history of chronic obstructive pulmonary disease, chronic anemia, hypercholesterolemia, hypothyroidism, depression and anxiety, history of renal stone and interstitial cystitis.   PAST SURGICAL HISTORY: Past surgical history was reviewed and the abdominal surgery was a hysterectomy but did not have her ovaries removed.  She has also had an appendectomy. No other abdominal surgery. It is also noted she has had five back operations with persistent chronic low back pain. She does have a spinal cord stimulator. She has had a right total knee replacement.   MEDICATIONS: Amitriptyline, benazepril, calcium with vitamin D, hydrochlorothiazide, hydrocodone with acetaminophen, Lasix, Lipitor, metoprolol, Ambien, vitamins, MiraLax 17 grams every other day, oxygen 2 liters at night, Valium, MS Contin 100 mg 3 times a day, amlodipine, Synthroid, and recent Cipro.   ALLERGIES: Sulfa, Lyrica, Biaxin, Levaquin.   PHYSICAL EXAMINATION:  GENERAL: She was initially found to be drowsy, however after some discussion she became more alert.   VITAL SIGNS: Temperature 98, pulse 74, respirations 18, blood pressure 93/60, oxygen saturation 95% on 3 liters of oxygen. Her weight is 233, height is 5 feet, and BMI is 45.5.   SKIN: Slightly pale.   LUNGS: Lung sounds are clear.   HEART: Regular rhythm, S1 and S2.   ABDOMEN: Very obese. There is some tympany in the upper abdomen with evidence of diastases recti. Large lower abdominal pannus.   ABDOMEN: Soft and nontender.   RECTAL: Rectal exam demonstrates good sphincter tone. No impaction. No palpable rectal mass.   EXTREMITIES: I noted the presence of a large dressing over the lateral aspect of the right thigh.   CLINICAL DATA: Glucose 132, creatinine 0.79, potassium 3.6. Hepatic panel was normal. Initial hemoglobin 11.8 and today was 9.3. Her MCV is 97.  Her pro time on admission was 13.0.   I reviewed her abdominal x-ray which on portable  films there appears to be some gaseous distention in the stomach but otherwise a moderate amount of air within the intestinal tract.   IMPRESSION:  Probable acute gastric retention which seems symptomatically improved today.   I do not think she needs any surgery, but I recommended she continue to sip slowly on clear liquids and take stool  softeners to help avoid constipation.   I recommended we repeat an abdominal x-ray tomorrow to again examined the bowel gas pattern and determine if she is improved and then make a decision about whether her diet can be advanced.   I reviewed her medicines. She is receiving the stool softener and also Protonix injection twice a day and p.r.n. Reglan.  I will order the follow-up x-ray for tomorrow and recheck tomorrow.  ____________________________ Lenna Sciara. Rochel Brome, MD jws:rbg D: 08/28/2011 19:30:33 ET T: 08/29/2011 10:31:51 ET JOB#: [id number]  cc: Loreli Dollar, MD, <Dictator> Loreli Dollar MD ELECTRONICALLY SIGNED 09/15/2011 9:10

## 2014-12-25 NOTE — Discharge Summary (Signed)
PATIENT NAME:  Kimberly Roy, Kimberly Roy MR#:  [id number] DATE OF BIRTH:  [birth year]  DATE OF ADMISSION:  08/26/2011 DATE OF DISCHARGE:  08/30/2011  DATE OF SURGERY: 08/27/2011  CONSULTATIONS:  1. Hospitalist/PrimeDoc.   2. Dr. Rochel Brome.   ADMITTING DIAGNOSIS: Right hip fracture.  DISCHARGE DIAGNOSES: 1. Right hip fracture.  2. Acute blood loss anemia secondary to surgery. 3. Abdominal distention without obstruction.   HISTORY: Patient is a 81 year old who has been followed at [facility] for years. On the day of admission patient had fallen while at home. She was unable to ambulate or bear weight. She denied any loss of consciousness. Denied any other significant orthopedic problems. She subsequently was brought to the [facility] where x-rays were obtained which revealed a fracture of the right hip. She states that she thinks that the sleeping medicine that she had taken had made her unstable on the morning of the fall. She was admitted to the hospital and subsequently underwent medical clearance by the hospitalist. Clearance was granted. She then was prepped for surgery which was done on 08/27/2011.  PROCEDURE: Open reduction internal fixation with intramedullary device and cerclage wire.   ANESTHESIA: General.   IMPLANTS UTILIZED: 11 x 360 mm right 130 degree AFFIXUS fracture nail, 115 mm lag screw, Synthes cables.    COMPLICATIONS: Breakage of drill bit which was able to be removed.   HOSPITAL COURSE: Patient tolerated the procedure very well. She had no complications. She was then taken to PAC-U where she was stabilized, then transferred to the orthopedic floor. Patient began receiving anticoagulation therapy of Lovenox 30 mg subcutaneous q.12 hours per anesthesia and pharmacy protocol. She was fitted with TED stockings bilaterally. These are allowed to be removed one hour per eight hour shift. The calves have been nontender and free of any evidence of any deep venous  thromboses. Negative Homans sign. Heels were elevated off the bed using rolled towels or pillows. Heels have been nontender. No tissue breakdown noted.   Patient was noted to be extremely nauseated on day one and complained of a lot of distention. She was noted to have hypoactive bowel sounds. She felt that she was regurgitating some bowel contents. Subsequently an abdominal x-ray was made which showed some distention in the left loop of the colon but no bowel obstruction. She was placed on clear liquid. This seemed to improve her condition. Subsequently a medical consult by Dr. Rochel Brome was obtained. A repeat abdominal film was reviewed which showed less distention and was felt to be more of the stomach versus actually true distention of the bowel. She continued to improve with the clear liquids and subsequently this was increased to regular diet which she was tolerating and was having no problems. She has voiced no other complaints.   Physical therapy was initiated on day one for gait training and transfers. This has been very slow due to the pain and size of the patient. Occupational therapy was also initiated on day one for activities of daily living and assistive devices.   Patient's vital signs have been stable. She has been afebrile. Hemodynamically she was stable. Hemodynamically her hemoglobin did drop to 6.9 and subsequently 2 units of packed RBCs was administered. A follow-up hemoglobin was 8.3. She was asymptomatic during this timeframe. Her other laboratory studies have all been within normal range.   Patient's catheter was removed on day two. The dressing was also changed on day two. The wounds have been free of any  drainage or signs of infection. The IV was discontinued on day three after the blood transfusion.   Patient is being discharged to  skilled nursing facility in improved stable condition.   DISCHARGE INSTRUCTIONS:  1. Will continue with TED stockings bilaterally. These are  allowed to be removed one hour per eight hour shift.  2. Incentive spirometer q.1 hour while awake.  3. Encourage cough, deep breathing every two hours while awake.  4. Physical therapy for gait training and transfers.  5. Occupational therapy for activities of daily living and assistive devices.  6. May weight bear as tolerated.  7. She is placed on a regular diet.  8. She will follow up in clinic in two weeks, sooner if any problems.  9. Log roll the patient every two hours.  10. Okay to gatch the head of the bed.  11. Elevate heels off the bed using rolled towels.  12. Change dressing as needed.   DRUG ALLERGIES: Biaxin, Levaquin, Lyrica, sulfa drugs.   MEDICATIONS:  1. Norco 5/325, 1 to 2 tablets every six hours p.r.n. for pain. 2. Tylenol ES 500 mg every four hours p.r.n.  3. Surfak 240 mg at bedtime.  4. Dulcolax suppository 10 mg rectally q.6 hours p.r.n.  5. MS Contin 100 mg every eight hours.  6. Synthroid 0.1 mg q.6 a.m.  7. Norvasc 10 mg daily. 8. Toprol-XL 100 mg b.i.d.  9. Lipitor 20 mg daily. 10. Amitriptyline 25 mg at bedtime. 11. Valium 5 mg q.8 hours p.r.n. for anxiety.  12. MiraLax 17 grams orally p.r.n.  13. Nucynta 150 mg every six hours p.r.n.  14. Lovenox 30 mg subcutaneous every 12 hours for 14 days, then discontinue and begin taking one 81 mg enteric-coated aspirin. 15. Hydrochlorothiazide 25 mg daily.   PAST MEDICAL HISTORY:  1. Benign hypertension.  2. Anemia.   3. Chronic obstructive pulmonary disease. 4. Hypercholesterolemia. 5. Hypothyroidism. 6. Gastroesophageal reflux disease.  7. Depression/anxiety.  8. History of prior kidney stones.  9. Interstitial cystitis. 10. Renal cyst.   ____________________________ Vance Peper, PA jrw:cms D: 08/30/2011 07:25:06 ET T: 08/30/2011 07:57:02 ET JOB#: [id number]  cc: Vance Peper, PA, <Dictator> Ellamae Lybeck PA ELECTRONICALLY SIGNED 09/04/2011 7:37

## 2015-01-04 ENCOUNTER — Other Ambulatory Visit: Payer: Self-pay | Admitting: Internal Medicine

## 2015-01-13 ENCOUNTER — Ambulatory Visit: Payer: PPO | Admitting: Pulmonary Disease

## 2015-01-13 ENCOUNTER — Ambulatory Visit (INDEPENDENT_AMBULATORY_CARE_PROVIDER_SITE_OTHER): Payer: PPO | Admitting: Pulmonary Disease

## 2015-01-13 ENCOUNTER — Encounter: Payer: Self-pay | Admitting: Pulmonary Disease

## 2015-01-13 VITALS — BP 126/82 | HR 73 | Temp 98.6°F | Ht 65.0 in | Wt 220.0 lb

## 2015-01-13 DIAGNOSIS — I272 Pulmonary hypertension, unspecified: Secondary | ICD-10-CM

## 2015-01-13 DIAGNOSIS — I270 Primary pulmonary hypertension: Secondary | ICD-10-CM

## 2015-01-13 DIAGNOSIS — G4733 Obstructive sleep apnea (adult) (pediatric): Secondary | ICD-10-CM | POA: Diagnosis not present

## 2015-01-13 NOTE — Progress Notes (Signed)
Subjective:    Patient ID: Kimberly Roy, female    DOB: 1931/08/07, 81 y.o.   MRN: [id number]  Synopsis: Mrs. Richer first saw the [facility] pulmonary clinic in November of 2013 for evaluation of pulmonary hypertension. In November 2012 she had a left heart cath and right heart cath. Her left heart catheterization showed no obstructive coronary disease the right heart cath showed a mean PA pressure of 35. This is associated with a cardiac index of 2.99 as well as a wedge pressure of 21. An echocardiogram at that time showed an LVEF of 40-45% and right ventricular dilation. She had used Adcirca but this did not improve her symptoms. She noted symptoms consistent with obstructive sleep apnea and never had a polysomnogram.  HPI  Chief Complaint  Patient presents with  . Follow-up    F/U PH and OSA: Pt has not been compliant with CPAP therapy. DME could not send download. PCP gave Ambien to help with sleep. Pt is having alot of health concerns, she is scheduled for bladder surgery on 01/23/15. She getting injection to help with her back pain. Pt has sob with exertion.    Solomiya has been suffering with a lot of bladder pain and has been undergoing injections.  She is supposed to have a cystoscopy in about ten days. She says the pain is making her miserable.  She has been told she has interstitial cystitis.    Jakaylah has not had any trouble breathing lately, but she does not remain very active.  She continues to use the oxygen at night but she has not been using the CPAP lately.  She says she just doesn't feel up to it.       Past Medical History  Diagnosis Date  . Osteoarthritis     knees/cervical and lumbar spine  . Hypertension   . Hyperlipidemia   . Hypothyroidism   . Anemia   . Depression   . Anxiety   . Nephrolithiasis   . Interstitial cystitis   . Pulmonary nodules     followed by Dr Raul Del  . Fatty liver   . Renal cyst     right  . Pulmonary hypertension     Review of  Systems  Constitutional: Positive for fatigue. Negative for fever and chills.  HENT: Negative for congestion, postnasal drip, rhinorrhea and sinus pressure.   Respiratory: Negative for cough, shortness of breath and wheezing.   Cardiovascular: Positive for leg swelling. Negative for chest pain and palpitations.       Objective:   Physical Exam  Filed Vitals:   01/13/15 1126  BP: 126/82  Pulse: 73  Temp: 98.6 F (37 C)  TempSrc: Oral  Height: _0  (1.651 m)  Weight: 220 lb (99.791 kg)  SpO2: 93%  RA  Gen: obese, chronically ill appearing, no acute distress HEENT: NCAT,  OP clear PULM: CTA B CV: RRR, systolic murmur RUSB, no JVD GI: BS+, soft, nontender, no hsm MSK: normal bulk and tone, warm, compression stockings in place, no clubbing, no cyanosis Derm: No no rash or skin breakdown  03/2012 Full PFT >> Ratio 82%, FEV 1.45 L (82%), TLC 2.99L (72% pred) DLCO 53% pred 07/2011 LHC >> normal coronary anatomy 07/2011 RHC >> Wedge 21; PA mean 35, 2.99 woods units; CO 4.6 L/min, CI 2.3 L/min/m2 07/2011 TTE >> mild RV dilation, LVEF 40-45%  12/29/2012 EKG > poor study due to TENS unit, no clear ST wave changes     Assessment &  Plan:   Pulmonary hypertension Due to CHF and OSA.  She has not been compliant with her CPAP recently.  Today I stressed the imprortance of using CPAP every night to reduce stress on her heart because of her systolic heart failure and pulmonary hypertension.   Her risk of a perioperative pulmonary complication from moderate anesthesia is low to moderate (less than 10%). I am okay with her proceeding with a cystoscopy.  Plan: Continue CPAP nightly, advised at length to resume treatment No role for a pulmonary vasodilator   OSA (obstructive sleep apnea) Advised at length today to resume treatment with the CPAP.  Plan: Obtain compliance report     Updated Medication List Outpatient Encounter Prescriptions as of 01/13/2015  Medication Sig  .  amLODipine (NORVASC) 10 MG tablet Take 10 mg by mouth daily.  Marland Kitchen atorvastatin (LIPITOR) 20 MG tablet TAKE ONE TABLET AT BEDTIME  . benazepril (LOTENSIN) 40 MG tablet TAKE ONE (1) TABLET EACH DAY  . Calcium-Magnesium-Vitamin D 600-40-500 MG-MG-UNIT TB24 Take 630 mg by mouth 4 (four) times daily.  [name] Sodium 30-100 MG CAPS Take by mouth.  . Cholecalciferol (VITAMIN D3) 1000 UNITS CAPS Take by mouth.  . DULoxetine (CYMBALTA) 30 MG capsule   . fluticasone (FLONASE) 50 MCG/ACT nasal spray Place 2 sprays into the nose daily.  . furosemide (LASIX) 20 MG tablet TAKE ONE (1) TABLET EACH DAY AS NEEDED  . gabapentin (NEURONTIN) 300 MG capsule Take 300 mg by mouth 3 (three) times daily.  . hydrochlorothiazide (HYDRODIURIL) 25 MG tablet TAKE ONE (1) TABLET EACH DAY  . HYDROcodone-acetaminophen (NORCO) 10-325 MG per tablet Take 1 tablet by mouth every 4 (four) hours as needed.  . Inulin (FIBER CHOICE PO) Take 2 tablets by mouth daily. Chew tablets  . metoprolol succinate (TOPROL-XL) 100 MG 24 hr tablet TAKE ONE TABLET TWICE DAILY. TAKE WITH OR IMMEDIATELY FOLLOWING A MEAL  . morphine (MS CONTIN) 60 MG 12 hr tablet Take 60 mg by mouth every 8 (eight) hours.  . Multiple Vitamins-Minerals (CENTRUM SILVER ADULT 50+ PO) Take by mouth daily.  Marland Kitchen nystatin cream (MYCOSTATIN) Apply topically 2 (two) times daily.  . pantoprazole (PROTONIX) 40 MG tablet Take 40 mg by mouth 2 (two) times daily.  . polyethylene glycol powder (GLYCOLAX/MIRALAX) powder MIX 17 GRAMS (AS MARKED IN BOTTLE TOP) IN 8 OUNCES OF WATER, MIX AND DRINK ONCEA DAY AS DIRECTED.  . Probiotic Product (ALIGN PO) Take by mouth daily.  . sodium chloride (OCEAN) 0.65 % nasal spray Place 1 spray into the nose as needed.  Marland Kitchen SYNTHROID 112 MCG tablet TAKE ONE (1) TABLET EACH DAY  . zolpidem (AMBIEN) 10 MG tablet Take 10 mg by mouth at bedtime as needed for sleep.  . [DISCONTINUED] ciprofloxacin (CIPRO) 500 MG tablet Take 1 tablet (500 mg  total) by mouth 2 (two) times daily. (Patient not taking: Reported on 01/13/2015)   No facility-administered encounter medications on file as of 01/13/2015.

## 2015-01-13 NOTE — Assessment & Plan Note (Signed)
Advised at length today to resume treatment with the CPAP.  Plan: Obtain compliance report

## 2015-01-13 NOTE — Assessment & Plan Note (Addendum)
Due to CHF and OSA.  She has not been compliant with her CPAP recently.  Today I stressed the imprortance of using CPAP every night to reduce stress on her heart because of her systolic heart failure and pulmonary hypertension.   Her risk of a perioperative pulmonary complication from moderate anesthesia is low to moderate (less than 10%). I am okay with her proceeding with a cystoscopy.  Plan: Continue CPAP nightly, advised at length to resume treatment No role for a pulmonary vasodilator

## 2015-01-13 NOTE — Patient Instructions (Signed)
User CPAP regularly, every night with oxygen We will obtain a download from your CPAP machine We will see you back in 6 months or sooner if needed

## 2015-01-16 ENCOUNTER — Other Ambulatory Visit: Payer: Self-pay | Admitting: Internal Medicine

## 2015-01-18 ENCOUNTER — Other Ambulatory Visit: Payer: Self-pay | Admitting: Nurse Practitioner

## 2015-01-18 DIAGNOSIS — R1010 Upper abdominal pain, unspecified: Secondary | ICD-10-CM

## 2015-01-19 DIAGNOSIS — G8929 Other chronic pain: Secondary | ICD-10-CM | POA: Insufficient documentation

## 2015-01-24 ENCOUNTER — Ambulatory Visit
Admission: RE | Admit: 2015-01-24 | Discharge: 2015-01-24 | Disposition: A | Discharge status: Home / Self Care | Payer: PPO | Source: Ambulatory Visit | Attending: Nurse Practitioner | Admitting: Nurse Practitioner

## 2015-01-24 DIAGNOSIS — R1010 Upper abdominal pain, unspecified: Secondary | ICD-10-CM | POA: Diagnosis present

## 2015-01-24 DIAGNOSIS — D1779 Benign lipomatous neoplasm of other sites: Secondary | ICD-10-CM | POA: Diagnosis not present

## 2015-02-02 ENCOUNTER — Encounter: Payer: Self-pay | Admitting: Internal Medicine

## 2015-02-08 ENCOUNTER — Encounter: Payer: Self-pay | Admitting: Internal Medicine

## 2015-02-10 ENCOUNTER — Ambulatory Visit (INDEPENDENT_AMBULATORY_CARE_PROVIDER_SITE_OTHER): Payer: PPO | Admitting: Internal Medicine

## 2015-02-10 ENCOUNTER — Encounter: Payer: Self-pay | Admitting: Internal Medicine

## 2015-02-10 VITALS — BP 134/50 | HR 71 | Temp 97.9°F | Ht 65.0 in | Wt 214.4 lb

## 2015-02-10 DIAGNOSIS — F439 Reaction to severe stress, unspecified: Secondary | ICD-10-CM

## 2015-02-10 DIAGNOSIS — E039 Hypothyroidism, unspecified: Secondary | ICD-10-CM | POA: Diagnosis not present

## 2015-02-10 DIAGNOSIS — R1084 Generalized abdominal pain: Secondary | ICD-10-CM

## 2015-02-10 DIAGNOSIS — R300 Dysuria: Secondary | ICD-10-CM | POA: Diagnosis not present

## 2015-02-10 DIAGNOSIS — R252 Cramp and spasm: Secondary | ICD-10-CM

## 2015-02-10 DIAGNOSIS — M5440 Lumbago with sciatica, unspecified side: Secondary | ICD-10-CM

## 2015-02-10 DIAGNOSIS — I10 Essential (primary) hypertension: Secondary | ICD-10-CM

## 2015-02-10 DIAGNOSIS — M7989 Other specified soft tissue disorders: Secondary | ICD-10-CM

## 2015-02-10 DIAGNOSIS — Z658 Other specified problems related to psychosocial circumstances: Secondary | ICD-10-CM

## 2015-02-10 DIAGNOSIS — M542 Cervicalgia: Secondary | ICD-10-CM

## 2015-02-10 DIAGNOSIS — D649 Anemia, unspecified: Secondary | ICD-10-CM | POA: Diagnosis not present

## 2015-02-10 LAB — URINALYSIS, ROUTINE W REFLEX MICROSCOPIC
BILIRUBIN URINE: NEGATIVE
HGB URINE DIPSTICK: NEGATIVE
Ketones, ur: NEGATIVE
Nitrite: NEGATIVE
PH: 6.5 (ref 5.0–8.0)
SPECIFIC GRAVITY, URINE: 1.015 (ref 1.000–1.030)
Total Protein, Urine: NEGATIVE
URINE GLUCOSE: NEGATIVE
Urobilinogen, UA: 0.2 (ref 0.0–1.0)

## 2015-02-10 LAB — CBC WITH DIFFERENTIAL/PLATELET
Basophils Absolute: 0 10*3/uL (ref 0.0–0.1)
Basophils Relative: 0.4 % (ref 0.0–3.0)
EOS ABS: 0.1 10*3/uL (ref 0.0–0.7)
Eosinophils Relative: 1.6 % (ref 0.0–5.0)
HCT: 32.7 % — ABNORMAL LOW (ref 36.0–46.0)
HEMOGLOBIN: 10.8 g/dL — AB (ref 12.0–15.0)
LYMPHS ABS: 2.2 10*3/uL (ref 0.7–4.0)
LYMPHS PCT: 24.6 % (ref 12.0–46.0)
MCHC: 33 g/dL (ref 30.0–36.0)
MCV: 96.6 fl (ref 78.0–100.0)
MONO ABS: 0.5 10*3/uL (ref 0.1–1.0)
MONOS PCT: 5.7 % (ref 3.0–12.0)
NEUTROS PCT: 67.7 % (ref 43.0–77.0)
Neutro Abs: 6 10*3/uL (ref 1.4–7.7)
PLATELETS: 172 10*3/uL (ref 150.0–400.0)
RBC: 3.39 Mil/uL — ABNORMAL LOW (ref 3.87–5.11)
RDW: 14.8 % (ref 11.5–15.5)
WBC: 8.9 10*3/uL (ref 4.0–10.5)

## 2015-02-10 LAB — BASIC METABOLIC PANEL
BUN: 22 mg/dL (ref 6–23)
CO2: 30 mEq/L (ref 19–32)
CREATININE: 1.01 mg/dL (ref 0.40–1.20)
Calcium: 9 mg/dL (ref 8.4–10.5)
Chloride: 100 mEq/L (ref 96–112)
GFR: 55.78 mL/min — ABNORMAL LOW (ref >60.00)
Glucose, Bld: 90 mg/dL (ref 70–99)
POTASSIUM: 4 meq/L (ref 3.5–5.1)
Sodium: 137 mEq/L (ref 135–145)

## 2015-02-10 LAB — TSH: TSH: 1.1 u[IU]/mL (ref 0.35–4.50)

## 2015-02-10 LAB — VITAMIN B12: Vitamin B-12: >1500 pg/mL — ABNORMAL HIGH (ref 211–911)

## 2015-02-10 LAB — MAGNESIUM: Magnesium: 1.5 mg/dL (ref 1.5–2.5)

## 2015-02-10 NOTE — Progress Notes (Signed)
Pre visit review using our clinic review tool, if applicable. No additional management support is needed unless otherwise documented below in the visit note.

## 2015-02-11 ENCOUNTER — Encounter: Payer: Self-pay | Admitting: Internal Medicine

## 2015-02-11 DIAGNOSIS — R252 Cramp and spasm: Secondary | ICD-10-CM | POA: Insufficient documentation

## 2015-02-11 DIAGNOSIS — M549 Dorsalgia, unspecified: Secondary | ICD-10-CM | POA: Insufficient documentation

## 2015-02-11 DIAGNOSIS — R109 Unspecified abdominal pain: Secondary | ICD-10-CM | POA: Insufficient documentation

## 2015-02-11 MED ORDER — MAGNESIUM OXIDE 400 MG PO TABS: 400.0000 mg | ORAL_TABLET | Freq: Every day | ORAL | Status: DC

## 2015-02-11 NOTE — Assessment & Plan Note (Signed)
Persistent intermittent abdominal pain.  Seeing GI.  Ultrasound as outlined.  On protonix bid.  Bowels doing well.  Planning for EGD soon.

## 2015-02-11 NOTE — Progress Notes (Signed)
Patient ID: SHANIKA LEVINGS, female   DOB: 31-Mar-1931, 81 y.o.   MRN: [id number]   Subjective:    Patient ID: [name], female    DOB: [birth year], 81 y.o.   MRN: [id number]  HPI  Patient here as a work in with concerns regarding some popping and cracking in her neck.  Also some increased neck pain that causes pain in her posterior head.  We discussed her recent neck xray.  Degenerative changes.  Discussed posture, etc.  She also reports some increased cramping in her feet and legs.  Describes her fingers and toes drawing.  Some jerking of her muscles - intermittently.  She has had persistent back pain and pain going down her legs.  Has seen pain clinic and ortho previously.  She request referral to neurosurgery for another opinion regarding her back.  She wants to see Dr Carloyn Manner.  She is still having increased intermittent episodes of abdominal pain and discomfort.  Seeing GI.  Had recent ultrasound.  Ok.  On protonix bid.  Is planning for EGD soon.  Bowels are doing better.  Some decreased appetite.  She is also concerned regarding a possible uti.    Past Medical History  Diagnosis Date  . Osteoarthritis     knees/cervical and lumbar spine  . Hypertension   . Hyperlipidemia   . Hypothyroidism   . Anemia   . Depression   . Anxiety   . Nephrolithiasis   . Interstitial cystitis   . Pulmonary nodules     followed by Dr Raul Del  . Fatty liver   . Renal cyst     right  . Pulmonary hypertension     Current Outpatient Prescriptions on File Prior to Visit  Medication Sig Dispense Refill  . amLODipine (NORVASC) 10 MG tablet TAKE ONE (1) TABLET EACH DAY 90 tablet 1  . atorvastatin (LIPITOR) 20 MG tablet TAKE ONE TABLET AT BEDTIME 90 tablet 2  . benazepril (LOTENSIN) 40 MG tablet TAKE ONE (1) TABLET EACH DAY 90 tablet 1  . Calcium-Magnesium-Vitamin D 600-40-500 MG-MG-UNIT TB24 Take 630 mg by mouth 4 (four) times daily.    [name] Sodium 30-100 MG CAPS Take by mouth.    .  Cholecalciferol (VITAMIN D3) 1000 UNITS CAPS Take by mouth.    . DULoxetine (CYMBALTA) 30 MG capsule   0  . fluticasone (FLONASE) 50 MCG/ACT nasal spray Place 2 sprays into the nose daily.    . furosemide (LASIX) 20 MG tablet TAKE ONE (1) TABLET EACH DAY AS NEEDED 30 tablet 6  . gabapentin (NEURONTIN) 300 MG capsule Take 300 mg by mouth 3 (three) times daily.    . hydrochlorothiazide (HYDRODIURIL) 25 MG tablet TAKE ONE (1) TABLET EACH DAY 90 tablet 1  . HYDROcodone-acetaminophen (NORCO) 10-325 MG per tablet Take 1 tablet by mouth every 4 (four) hours as needed.    . Inulin (FIBER CHOICE PO) Take 2 tablets by mouth daily. Chew tablets    . metoprolol succinate (TOPROL-XL) 100 MG 24 hr tablet TAKE ONE TABLET TWICE DAILY. TAKE WITH OR IMMEDIATELY FOLLOWING A MEAL 180 tablet 3  . morphine (MS CONTIN) 60 MG 12 hr tablet Take 60 mg by mouth every 8 (eight) hours.    . Multiple Vitamins-Minerals (CENTRUM SILVER ADULT 50+ PO) Take by mouth daily.    Marland Kitchen nystatin cream (MYCOSTATIN) Apply topically 2 (two) times daily. 30 g 1  . pantoprazole (PROTONIX) 40 MG tablet Take 40 mg by mouth 2 (two) times daily.    Marland Kitchen  polyethylene glycol powder (GLYCOLAX/MIRALAX) powder MIX 17 GRAMS (AS MARKED IN BOTTLE TOP) IN 8 OUNCES OF WATER, MIX AND DRINK ONCEA DAY AS DIRECTED. 527 g 2  . Probiotic Product (ALIGN PO) Take by mouth daily.    . sodium chloride (OCEAN) 0.65 % nasal spray Place 1 spray into the nose as needed.    Marland Kitchen SYNTHROID 112 MCG tablet TAKE ONE (1) TABLET EACH DAY 90 tablet 2  . zolpidem (AMBIEN) 10 MG tablet Take 10 mg by mouth at bedtime as needed for sleep.     No current facility-administered medications on file prior to visit.    Review of Systems  Constitutional: Positive for appetite change (some decreased appetite. ) and fatigue. Negative for unexpected weight change.  HENT: Negative for congestion and sinus pressure.   Respiratory: Negative for cough, chest tightness and shortness of breath.     Cardiovascular: Negative for chest pain, palpitations and leg swelling (leg swelling improved. ).  Gastrointestinal: Positive for abdominal pain. Negative for nausea, vomiting and diarrhea.  Genitourinary: Positive for dysuria. Negative for difficulty urinating.  Musculoskeletal: Positive for back pain (back and leg pain as outlined. ).       Increased neck and shoulder pain as outlined.    Skin: Negative for color change and rash.  Neurological: Positive for headaches (related to her neck. ). Negative for dizziness and light-headedness.  Psychiatric/Behavioral: Negative for dysphoric mood and agitation.       Objective:     Pulse 56-60  Physical Exam  Constitutional: She appears well-developed and well-nourished.  HENT:  Nose: Nose normal.  Mouth/Throat: Oropharynx is clear and moist.  Neck: Neck supple.  Increased pain to palpation over her posterior lateral neck and upper shoulders.  Some increased pain with rotation of her head from right to left.    Cardiovascular: Normal rate and regular rhythm.   Pulmonary/Chest: Breath sounds normal. No respiratory distress. She has no wheezes.  Abdominal: Soft. Bowel sounds are normal. There is no tenderness.  Musculoskeletal: She exhibits no edema or tenderness.  Lymphadenopathy:    She has no cervical adenopathy.  Skin: No rash noted. No erythema.  Psychiatric: She has a normal mood and affect. Her behavior is normal.    BP 134/50 mmHg  Pulse 71  Temp(Src) 97.9 F (36.6 C) (Oral)  Ht _0  (1.651 m)  Wt 214 lb 6 oz (97.24 kg)  BMI 35.67 kg/m2  SpO2 95% Wt Readings from Last 3 Encounters:  02/10/15 214 lb 6 oz (97.24 kg)  01/13/15 220 lb (99.791 kg)  12/12/14 220 lb 6 oz (99.961 kg)     Lab Results  Component Value Date   WBC 8.9 02/10/2015   HGB 10.8* 02/10/2015   HCT 32.7* 02/10/2015   PLT 172.0 02/10/2015   GLUCOSE 90 02/10/2015   CHOL 136 10/19/2014   TRIG 136.0 10/19/2014   HDL 36.50* 10/19/2014   LDLCALC 72  10/19/2014   ALT 13 10/19/2014   AST 19 10/19/2014   NA 137 02/10/2015   K 4.0 02/10/2015   CL 100 02/10/2015   CREATININE 1.01 02/10/2015   BUN 22 02/10/2015   CO2 30 02/10/2015   TSH 1.10 02/10/2015   INR 1.0 12/08/2013   HGBA1C 6.0 10/19/2014    US Abdomen Complete  01/24/2015   CLINICAL DATA:  Abdominal pain.  EXAM: ULTRASOUND ABDOMEN COMPLETE  COMPARISON:  CT 10/28/2014.  FINDINGS: Gallbladder: No gallstones or wall thickening visualized. No sonographic Murphy sign noted.  Common bile  duct: Diameter: 7.4 mm. This is stable to slightly smaller than on prior CT of 10/28/2014. No obstructing lesion identified.  Liver: No focal lesion identified. Within normal limits in parenchymal echogenicity. Stable mild intrahepatic biliary ductal prominence noted.  IVC: No abnormality visualized.  Pancreas: Visualized portion unremarkable.  Spleen: Size and appearance within normal limits.  Right Kidney: Length: 12.1 cm. Echogenicity within normal limits. No hydronephrosis visualized. 6.0 cm cyst. Renal cortical thinning.  Left Kidney: Length: 12.1 cm. Echogenicity within normal limits. No mass or hydronephrosis visualized. Item cortical thinning.  Abdominal aorta: No aneurysm visualized.  Other findings: Small nodular hypoechoic lesion with central increased echogenicity known right suprarenal lesions consistent patient's known adrenal myelo lipoma.  IMPRESSION: 1. Stable mild intrahepatic and extrahepatic biliary distention. No acute abnormality .  2. Right adrenal myelo lipoma.  Stable .   Electronically Signed   By: Marcello Moores  Register   On: 01/24/2015 11:58       Assessment & Plan:   Problem List Items Addressed This Visit    Abdominal pain    Persistent intermittent abdominal pain.  Seeing GI.  Ultrasound as outlined.  On protonix bid.  Bowels doing well.  Planning for EGD soon.        Anemia   Relevant Orders   CBC with Differential/Platelet (Completed)   Back pain    Persistent back pain and  pain in her legs.  Has been evaluated previously by ortho and pain clinic.  Desires to see neurosurgery (Dr Carloyn Manner).  Will schedule appointment.        Hypertension - Primary    Blood pressure under good control.  Same medication regimen.  Follow pressures.  Follow metabolic panel.       Relevant Orders   Basic metabolic panel (Completed)   Hypothyroidism    On thyroid replacement.  Check tsh.       Relevant Orders   TSH (Completed)   Leg cramps    Leg and foot/toe cramping and arms and hand cramping.  Fingers and toes drawing.  Check electrolyte panel and magnesium.  Stay hydrated.  Follow.       Relevant Orders   Vitamin B12 (Completed)   Magnesium (Completed)   Leg swelling    Doing well with compression hose.  Follow.       Neck pain    Increased neck and shoulder pain.  Previous xray with degenerative changes.  Work on posture, etc.  Desires no further intervention at this time.  Follow.        Stress    Increased stress.  Seeing Dr Nicolasa Ducking.         Other Visit Diagnoses    Dysuria        Relevant Orders    Urinalysis, Routine w reflex microscopic (not at [facility]) (Completed)    CULTURE, URINE COMPREHENSIVE      I spent 25 minutes with the patient and more than 50% of the time was spent in consultation regarding the above.     Einar Pheasant, MD

## 2015-02-11 NOTE — Assessment & Plan Note (Signed)
On thyroid replacement.  Check tsh.

## 2015-02-11 NOTE — Assessment & Plan Note (Signed)
Blood pressure under good control.  Same medication regimen.  Follow pressures.  Follow metabolic panel.

## 2015-02-11 NOTE — Assessment & Plan Note (Signed)
Leg and foot/toe cramping and arms and hand cramping.  Fingers and toes drawing.  Check electrolyte panel and magnesium.  Stay hydrated.  Follow.

## 2015-02-11 NOTE — Assessment & Plan Note (Signed)
Persistent back pain and pain in her legs.  Has been evaluated previously by ortho and pain clinic.  Desires to see neurosurgery (Dr Carloyn Manner).  Will schedule appointment.

## 2015-02-11 NOTE — Assessment & Plan Note (Signed)
Increased neck and shoulder pain.  Previous xray with degenerative changes.  Work on posture, etc.  Desires no further intervention at this time.  Follow.

## 2015-02-11 NOTE — Assessment & Plan Note (Signed)
Increased stress.  Seeing Dr Nicolasa Ducking.

## 2015-02-11 NOTE — Assessment & Plan Note (Signed)
Doing well with compression hose.  Follow.

## 2015-02-13 LAB — CULTURE, URINE COMPREHENSIVE: Colony Count: >=100000

## 2015-02-14 ENCOUNTER — Other Ambulatory Visit: Payer: Self-pay | Admitting: *Deleted

## 2015-02-14 MED ORDER — CEFDINIR 300 MG PO CAPS: 300.0000 mg | ORAL_CAPSULE | Freq: Two times a day (BID) | ORAL | Status: DC

## 2015-02-15 ENCOUNTER — Telehealth: Payer: Self-pay

## 2015-02-15 NOTE — Telephone Encounter (Signed)
Patient called and was started on Omincef last night for a urine infection.  Patient started this morning throwing up yellow bile and having diarrhea.  Please advise what patient should do? Thanks

## 2015-02-15 NOTE — Telephone Encounter (Signed)
Not sure if symptoms are related to omnicef.  She has been having problems with her stomach and this also could be a virus.  Can hold abx today.  Fluids and if able bland food.  If persistent symptoms, will have to be evaluated.

## 2015-02-16 ENCOUNTER — Ambulatory Visit: Admission: RE | Admit: 2015-02-16 | Payer: PPO | Source: Ambulatory Visit | Admitting: Gastroenterology

## 2015-02-16 ENCOUNTER — Encounter: Admission: RE | Payer: Self-pay | Source: Ambulatory Visit

## 2015-02-16 SURGERY — EGD (ESOPHAGOGASTRODUODENOSCOPY)
Anesthesia: General

## 2015-02-16 NOTE — Telephone Encounter (Signed)
Spoke with the patient.  She had originally had a upper GI procedure scheduled today, but cancellled as she doesn't feel any better.  She threw up all day yesterday and has only drank a small amount of fluids.  Feels weak today.  Discussed that she needs to increase her fluid intake and try to eat some bland foods as tolerated.  She is concerned that if she holds the antibiotic she will have issues with the urinary infection.  She agreed to call the office this afternoon if she isn't feeling better to make an appointment.

## 2015-02-16 NOTE — Telephone Encounter (Signed)
Please f/u with her this pm.  If still having vomiting, etc - will probably need evaluation at the ER - may need fluids.

## 2015-02-16 NOTE — Telephone Encounter (Signed)
Spoke with patient.  She is still not feeling great, having burning with urination, she chose to hold on the antibiotic.  She has not thrown up today or had diarrhea.  She did tolerate food and has been drinking fluids.

## 2015-02-16 NOTE — Telephone Encounter (Signed)
Per Dr. Nicki Reaper, called patient back and asked her to take the antibiotic tonight and I will call in the AM to follow up.

## 2015-02-17 NOTE — Telephone Encounter (Signed)
Noted.  Just let us know if needs anything more.

## 2015-02-17 NOTE — Telephone Encounter (Signed)
Spoke with Patient this morning.  She was able to tolerate the medication last night with dinner and it has stayed down.  Kimberly Roy that she is feeling much better today so far.

## 2015-02-24 ENCOUNTER — Encounter: Payer: Self-pay | Admitting: *Deleted

## 2015-02-27 ENCOUNTER — Encounter: Payer: Self-pay | Admitting: *Deleted

## 2015-02-27 ENCOUNTER — Ambulatory Visit
Admission: RE | Admit: 2015-02-27 | Discharge: 2015-02-27 | Disposition: A | Discharge status: Home / Self Care | Payer: PPO | Source: Ambulatory Visit | Attending: Gastroenterology | Admitting: Gastroenterology

## 2015-02-27 ENCOUNTER — Encounter
Admission: RE | Disposition: A | Discharge status: Home / Self Care | Payer: Self-pay | Source: Ambulatory Visit | Attending: Gastroenterology

## 2015-02-27 ENCOUNTER — Ambulatory Visit: Payer: PPO | Admitting: Registered Nurse

## 2015-02-27 DIAGNOSIS — Z9889 Other specified postprocedural states: Secondary | ICD-10-CM | POA: Insufficient documentation

## 2015-02-27 DIAGNOSIS — F419 Anxiety disorder, unspecified: Secondary | ICD-10-CM | POA: Diagnosis not present

## 2015-02-27 DIAGNOSIS — Z79899 Other long term (current) drug therapy: Secondary | ICD-10-CM | POA: Insufficient documentation

## 2015-02-27 DIAGNOSIS — E785 Hyperlipidemia, unspecified: Secondary | ICD-10-CM | POA: Diagnosis not present

## 2015-02-27 DIAGNOSIS — Z90710 Acquired absence of both cervix and uterus: Secondary | ICD-10-CM | POA: Insufficient documentation

## 2015-02-27 DIAGNOSIS — Z888 Allergy status to other drugs, medicaments and biological substances status: Secondary | ICD-10-CM | POA: Insufficient documentation

## 2015-02-27 DIAGNOSIS — Z8249 Family history of ischemic heart disease and other diseases of the circulatory system: Secondary | ICD-10-CM | POA: Insufficient documentation

## 2015-02-27 DIAGNOSIS — M5030 Other cervical disc degeneration, unspecified cervical region: Secondary | ICD-10-CM | POA: Diagnosis not present

## 2015-02-27 DIAGNOSIS — Z881 Allergy status to other antibiotic agents status: Secondary | ICD-10-CM | POA: Diagnosis not present

## 2015-02-27 DIAGNOSIS — I272 Other secondary pulmonary hypertension: Secondary | ICD-10-CM | POA: Insufficient documentation

## 2015-02-27 DIAGNOSIS — R1310 Dysphagia, unspecified: Secondary | ICD-10-CM | POA: Diagnosis present

## 2015-02-27 DIAGNOSIS — I10 Essential (primary) hypertension: Secondary | ICD-10-CM | POA: Insufficient documentation

## 2015-02-27 DIAGNOSIS — Z86718 Personal history of other venous thrombosis and embolism: Secondary | ICD-10-CM | POA: Diagnosis not present

## 2015-02-27 DIAGNOSIS — F329 Major depressive disorder, single episode, unspecified: Secondary | ICD-10-CM | POA: Insufficient documentation

## 2015-02-27 DIAGNOSIS — K760 Fatty (change of) liver, not elsewhere classified: Secondary | ICD-10-CM | POA: Diagnosis not present

## 2015-02-27 DIAGNOSIS — Z823 Family history of stroke: Secondary | ICD-10-CM | POA: Insufficient documentation

## 2015-02-27 DIAGNOSIS — M1990 Unspecified osteoarthritis, unspecified site: Secondary | ICD-10-CM | POA: Diagnosis not present

## 2015-02-27 DIAGNOSIS — R1010 Upper abdominal pain, unspecified: Secondary | ICD-10-CM | POA: Diagnosis present

## 2015-02-27 DIAGNOSIS — E039 Hypothyroidism, unspecified: Secondary | ICD-10-CM | POA: Insufficient documentation

## 2015-02-27 DIAGNOSIS — Z882 Allergy status to sulfonamides status: Secondary | ICD-10-CM | POA: Insufficient documentation

## 2015-02-27 DIAGNOSIS — K219 Gastro-esophageal reflux disease without esophagitis: Secondary | ICD-10-CM | POA: Diagnosis present

## 2015-02-27 HISTORY — DX: Acute embolism and thrombosis of unspecified deep veins of unspecified lower extremity: I82.409

## 2015-02-27 HISTORY — DX: Headache, unspecified: R51.9

## 2015-02-27 HISTORY — PX: ESOPHAGOGASTRODUODENOSCOPY: SHX5428

## 2015-02-27 HISTORY — DX: Other cervical disc degeneration, unspecified cervical region: M50.30

## 2015-02-27 HISTORY — DX: Headache: R51

## 2015-02-27 SURGERY — EGD (ESOPHAGOGASTRODUODENOSCOPY)
Anesthesia: General

## 2015-02-27 MED ORDER — PROPOFOL INFUSION 10 MG/ML OPTIME
INTRAVENOUS | Status: DC | PRN
  Administered 2015-02-27: 140 ug/kg/min via INTRAVENOUS

## 2015-02-27 MED ORDER — MIDAZOLAM HCL 2 MG/2ML IJ SOLN
INTRAMUSCULAR | Status: DC | PRN
  Administered 2015-02-27: 1 mg via INTRAVENOUS

## 2015-02-27 MED ORDER — GLYCOPYRROLATE 0.2 MG/ML IJ SOLN
INTRAMUSCULAR | Status: DC | PRN
  Administered 2015-02-27: 0.2 mg via INTRAVENOUS

## 2015-02-27 MED ORDER — PROPOFOL 10 MG/ML IV BOLUS
INTRAVENOUS | Status: DC | PRN
  Administered 2015-02-27: 50 mg via INTRAVENOUS

## 2015-02-27 MED ORDER — SODIUM CHLORIDE 0.9 % IV SOLN
INTRAVENOUS | Status: DC
Start: 2015-02-27 — End: 2015-02-27

## 2015-02-27 MED ORDER — FENTANYL CITRATE (PF) 100 MCG/2ML IJ SOLN
INTRAMUSCULAR | Status: DC | PRN
Start: 2015-02-27 — End: 2015-02-27
  Administered 2015-02-27: 50 ug via INTRAVENOUS

## 2015-02-27 MED ORDER — EPHEDRINE SULFATE 50 MG/ML IJ SOLN
INTRAMUSCULAR | Status: DC | PRN
  Administered 2015-02-27: 15 mg via INTRAVENOUS

## 2015-02-27 MED ORDER — LIDOCAINE HCL (CARDIAC) 20 MG/ML IV SOLN
INTRAVENOUS | Status: DC | PRN
  Administered 2015-02-27: 40 mg via INTRAVENOUS

## 2015-02-27 MED ORDER — SODIUM CHLORIDE 0.9 % IV SOLN
INTRAVENOUS | Status: DC
  Administered 2015-02-27: 11:00:00 via INTRAVENOUS

## 2015-02-27 NOTE — Anesthesia Procedure Notes (Signed)
Date/Time: 02/27/2015 11:04 AM Performed by: Doreen Salvage Pre-anesthesia Checklist: Patient identified, Emergency Drugs available, Suction available and Patient being monitored Patient Re-evaluated:Patient Re-evaluated prior to inductionOxygen Delivery Method: Nasal cannula

## 2015-02-27 NOTE — Anesthesia Preprocedure Evaluation (Signed)
Anesthesia Evaluation  Patient identified by MRN, date of birth, ID band Patient awake    Reviewed: Allergy & Precautions, H&P , NPO status , Patient's Chart, lab work & pertinent test results, reviewed documented beta blocker date and time   Airway Mallampati: II  TM Distance: >3 FB Neck ROM: full    Dental no notable dental hx.    Pulmonary neg pulmonary ROS, sleep apnea ,  breath sounds clear to auscultation  Pulmonary exam normal       Cardiovascular Exercise Tolerance: Good hypertension, negative cardio ROS  Rhythm:regular Rate:Normal     Neuro/Psych  Headaches, PSYCHIATRIC DISORDERS negative neurological ROS  negative psych ROS   GI/Hepatic negative GI ROS, Neg liver ROS,   Endo/Other  negative endocrine ROSHypothyroidism   Renal/GU negative Renal ROS  negative genitourinary   Musculoskeletal   Abdominal   Peds  Hematology negative hematology ROS (+) anemia ,   Anesthesia Other Findings   Reproductive/Obstetrics negative OB ROS                             Anesthesia Physical Anesthesia Plan  ASA: III  Anesthesia Plan: General   Post-op Pain Management:    Induction:   Airway Management Planned:   Additional Equipment:   Intra-op Plan:   Post-operative Plan:   Informed Consent: I have reviewed the patients History and Physical, chart, labs and discussed the procedure including the risks, benefits and alternatives for the proposed anesthesia with the patient or authorized representative who has indicated his/her understanding and acceptance.   Dental Advisory Given  Plan Discussed with: CRNA  Anesthesia Plan Comments:         Anesthesia Quick Evaluation

## 2015-02-27 NOTE — Transfer of Care (Signed)
  Immediate Anesthesia Transfer of Care Note  Patient: Kimberly Roy  Procedure(s) Performed: Procedure(s): ESOPHAGOGASTRODUODENOSCOPY (EGD) (N/A)  Patient Location: PACU and Endoscopy Unit  Anesthesia Type:General  Level of Consciousness: sedated  Airway & Oxygen Therapy: Patient Spontanous Breathing and Patient connected to nasal cannula oxygen  Post-op Assessment: Report given to RN and Post -op Vital signs reviewed and stable  Post vital signs: Reviewed and stable  Last Vitals:  Filed Vitals:   02/27/15 1120  BP: 94/37  Pulse: 60  Temp: 35.6 C  Resp: 14    Complications: No apparent anesthesia complications

## 2015-02-27 NOTE — Op Note (Signed)
[facility] Patient Name: Kimberly Roy Procedure Date: 02/27/2015 11:02 AM MRN: [id number] Account #: [id number] Date of Birth: [birth year] Admit Type: Outpatient Age: 81 Room: [facility] ENDO ROOM 4 Gender: Female Note Status: Finalized Procedure:         Upper GI endoscopy Indications:       Upper abdominal pain, Dysphagia, Suspected esophageal                     reflux, Nausea with vomiting Providers:         Lupita Dawn. Candace Cruise, MD Referring MD:      Einar Pheasant, MD (Referring MD) Medicines:         Monitored Anesthesia Care Complications:     No immediate complications. Procedure:         Pre-Anesthesia Assessment:                    - Prior to the procedure, a History and Physical was                     performed, and patient medications, allergies and                     sensitivities were reviewed. The patient's tolerance of                     previous anesthesia was reviewed.                    - The risks and benefits of the procedure and the sedation                     options and risks were discussed with the patient. All                     questions were answered and informed consent was obtained.                    - After reviewing the risks and benefits, the patient was                     deemed in satisfactory condition to undergo the procedure.                    After obtaining informed consent, the endoscope was passed                     under direct vision. Throughout the procedure, the                     patient's blood pressure, pulse, and oxygen saturations                     were monitored continuously. The Olympus GIF-160 endoscope                     (S#. [id number]) was introduced through the mouth, and                     advanced to the second part of duodenum. The upper GI                     endoscopy was accomplished without difficulty. The patient  tolerated the procedure well. Findings:      No  endoscopic abnormality was evident in the esophagus to explain the       patient's complaint of dysphagia. It was decided, however, to proceed       with dilation of the entire esophagus. The scope was withdrawn. Dilation       was performed with a Maloney dilator with mild resistance at 35 Fr.      The entire examined stomach was normal. Biopsies were taken with a cold       forceps for Helicobacter pylori testing.      The examined duodenum was normal. Impression:        - No endoscopic esophageal abnormality to explain                     patient's dysphagia. Esophagus dilated. Dilated.                    - Normal stomach. Biopsied.                    - Normal examined duodenum.                    - May have IBS Recommendation:    - Discharge patient to home.                    - Observe patient's clinical course.                    - Continue present medications.                    - The findings and recommendations were discussed with the                     patient. Procedure Code(s): --- Professional ---                    [phone], Esophagogastroduodenoscopy, flexible, transoral;                     with biopsy, single or multiple                    43450, Dilation of esophagus, by unguided sound or bougie,                     single or multiple passes Diagnosis Code(s): --- Professional ---                    R13.10, Dysphagia, unspecified                    R10.10, Upper abdominal pain, unspecified                    R11.2, Nausea with vomiting, unspecified CPT copyright 2014 [employer]. All rights reserved. The codes documented in this report are preliminary and upon coder review may  be revised to meet current compliance requirements. Hulen Luster, MD 02/27/2015 11:12:58 AM This report has been signed electronically. Number of Addenda: 0 Note Initiated On: 02/27/2015 11:02 AM      [facility]

## 2015-02-27 NOTE — H&P (Signed)
Primary Care Physician:  Einar Pheasant, MD Primary Gastroenterologist:  Dr. Candace Cruise  Pre-Procedure History & Physical: HPI:  Kimberly Roy is a 81 y.o. female is here for an EGD with possible dilation.   Past Medical History  Diagnosis Date  . Osteoarthritis     knees/cervical and lumbar spine  . Hypertension   . Hyperlipidemia   . Hypothyroidism   . Anemia   . Depression   . Anxiety   . Nephrolithiasis   . Interstitial cystitis   . Pulmonary nodules     followed by Dr Raul Del  . Fatty liver   . Renal cyst     right  . Pulmonary hypertension   . Headache   . DVT (deep venous thrombosis)   . Fatty liver   . DDD (degenerative disc disease), cervical     Past Surgical History  Procedure Laterality Date  . Breast reduction surgery      3/99  . Back surgeries    . Appendectomy    . Cervical spine surgery    . Tonsilectomy/adenoidectomy with myringotomy    . Excisional hemorrhoidectomy    . Rotator cuff surgery      blilateral  . Knee arthroscopy      left  . Abdominal hysterectomy      ovaries left in place  . Cataracts Bilateral   . Hemorrhoid surgery      Prior to Admission medications   Medication Sig Start Date End Date Taking? Authorizing Provider  ondansetron (ZOFRAN-ODT) 4 MG disintegrating tablet Take 4 mg by mouth every 8 (eight) hours as needed for nausea or vomiting.   Yes Historical Provider, MD  aluminum-magnesium hydroxide-simethicone (MAALOX) 811-914-78 MG/5ML SUSP Take by mouth.    Historical Provider, MD  amLODipine (NORVASC) 10 MG tablet TAKE ONE (1) TABLET EACH DAY 01/16/15   Einar Pheasant, MD  atorvastatin (LIPITOR) 20 MG tablet TAKE ONE TABLET AT BEDTIME 10/17/14   Einar Pheasant, MD  benazepril (LOTENSIN) 40 MG tablet TAKE ONE (1) TABLET EACH DAY 01/16/15   Einar Pheasant, MD  Calcium-Magnesium-Vitamin D 295-62-130 MG-MG-UNIT TB24 Take 630 mg by mouth 4 (four) times daily.    Historical Provider, MD  Casanthranol-Docusate Sodium 30-100 MG CAPS  Take by mouth.    Historical Provider, MD  cefdinir (OMNICEF) 300 MG capsule Take 1 capsule (300 mg total) by mouth 2 (two) times daily. 02/14/15   Einar Pheasant, MD  Cholecalciferol (VITAMIN D3) 1000 UNITS CAPS Take by mouth.    Historical Provider, MD  DULoxetine (CYMBALTA) 30 MG capsule  08/01/14   Historical Provider, MD  fluticasone (FLONASE) 50 MCG/ACT nasal spray Place 2 sprays into the nose daily.    Historical Provider, MD  furosemide (LASIX) 20 MG tablet TAKE ONE (1) TABLET EACH DAY AS NEEDED 05/25/14   Einar Pheasant, MD  gabapentin (NEURONTIN) 300 MG capsule Take 300 mg by mouth 3 (three) times daily.    Historical Provider, MD  hydrochlorothiazide (HYDRODIURIL) 25 MG tablet TAKE ONE (1) TABLET EACH DAY 11/03/14   Einar Pheasant, MD  HYDROcodone-acetaminophen (NORCO) 10-325 MG per tablet Take 1 tablet by mouth every 4 (four) hours as needed.    Historical Provider, MD  hydrOXYzine (ATARAX/VISTARIL) 25 MG tablet Take 25 mg by mouth at bedtime.    Historical Provider, MD  Inulin (FIBER CHOICE PO) Take 2 tablets by mouth daily. Chew tablets    Historical Provider, MD  magnesium oxide (MAG-OX) 400 MG tablet Take 1 tablet (400 mg total)  by mouth daily. 02/11/15   Einar Pheasant, MD  metoprolol succinate (TOPROL-XL) 100 MG 24 hr tablet TAKE ONE TABLET TWICE DAILY. TAKE WITH OR IMMEDIATELY FOLLOWING A MEAL 12/12/14   Jackolyn Confer, MD  morphine (MS CONTIN) 60 MG 12 hr tablet Take 60 mg by mouth every 8 (eight) hours.    Historical Provider, MD  Multiple Vitamins-Minerals (CENTRUM SILVER ADULT 50+ PO) Take by mouth daily.    Historical Provider, MD  nystatin cream (MYCOSTATIN) Apply topically 2 (two) times daily. 07/11/14   Einar Pheasant, MD  pantoprazole (PROTONIX) 40 MG tablet Take 40 mg by mouth 2 (two) times daily.    Historical Provider, MD  polyethylene glycol powder (GLYCOLAX/MIRALAX) powder MIX 17 GRAMS (AS MARKED IN BOTTLE TOP) IN 8 OUNCES OF WATER, MIX AND DRINK ONCEA DAY AS DIRECTED.  01/04/15   Einar Pheasant, MD  Probiotic Product (ALIGN PO) Take by mouth daily.    Historical Provider, MD  Simethicone (GAS-X PO) Take by mouth.    Historical Provider, MD  sodium chloride (OCEAN) 0.65 % nasal spray Place 1 spray into the nose as needed.    Historical Provider, MD  SYNTHROID 112 MCG tablet TAKE ONE (1) TABLET EACH DAY 10/17/14   Einar Pheasant, MD  zolpidem (AMBIEN) 10 MG tablet Take 10 mg by mouth at bedtime as needed for sleep.    Historical Provider, MD    Allergies as of 02/24/2015 - Review Complete 02/15/2015  Allergen Reaction Noted  . Biaxin [clarithromycin]  07/03/2012  . Levaquin [levofloxacin]  07/03/2012  . Lyrica [pregabalin]  07/03/2012  . Nucynta er [tapentadol hcl er] Other (See Comments) 07/04/2012  . Sertraline  01/13/2015  . Sulfa antibiotics  07/03/2012  . Zoloft [sertraline hcl]  08/30/2014    Family History  Problem Relation Age of Onset  . Heart disease Mother   . Stroke Mother   . Heart disease Father     myocardial infarction age 48  . Hypertension Mother     History   Social History  . Marital Status: Married    Spouse Name: N/A  . Number of Children: 2  . Years of Education: N/A   Occupational History  . Not on file.   Social History Main Topics  . Smoking status: Never Smoker   . Smokeless tobacco: Never Used  . Alcohol Use: No  . Drug Use: No  . Sexual Activity: Not on file   Other Topics Concern  . Not on file   Social History Narrative    Review of Systems: See HPI, otherwise negative ROS  Physical Exam: There were no vitals taken for this visit. General:   Alert,  pleasant and cooperative in NAD Head:  Normocephalic and atraumatic. Neck:  Supple; no masses or thyromegaly. Lungs:  Clear throughout to auscultation.    Heart:  Regular rate and rhythm. Abdomen:  Soft, nontender and nondistended. Normal bowel sounds, without guarding, and without rebound.   Neurologic:  Alert and  oriented x4;  grossly normal  neurologically.  Impression/Plan: Kimberly Roy is here for an EGD to be performed for nausea, GERD, dysphagia, and upper abdominal pain.  Risks, benefits, limitations, and alternatives regarding EGD have been reviewed with the patient.  Questions have been answered.  All parties agreeable.   Elzie Knisley, Lupita Dawn, MD  02/27/2015, 10:19 AM

## 2015-02-28 LAB — SURGICAL PATHOLOGY

## 2015-02-28 NOTE — Anesthesia Postprocedure Evaluation (Signed)
  Anesthesia Post-op Note  Patient: Kimberly Roy  Procedure(s) Performed: Procedure(s): ESOPHAGOGASTRODUODENOSCOPY (EGD) (N/A)  Anesthesia type:General  Patient location: PACU  Post pain: Pain level controlled  Post assessment: Post-op Vital signs reviewed, Patient's Cardiovascular Status Stable, Respiratory Function Stable, Patent Airway and No signs of Nausea or vomiting  Post vital signs: Reviewed and stable  Last Vitals:  Filed Vitals:   02/27/15 1150  BP: 100/43  Pulse: 59  Temp:   Resp: 16    Level of consciousness: awake, alert  and patient cooperative  Complications: No apparent anesthesia complications

## 2015-03-01 ENCOUNTER — Encounter: Payer: Self-pay | Admitting: Gastroenterology

## 2015-03-02 ENCOUNTER — Ambulatory Visit (INDEPENDENT_AMBULATORY_CARE_PROVIDER_SITE_OTHER): Payer: PPO | Admitting: Internal Medicine

## 2015-03-02 ENCOUNTER — Encounter: Payer: Self-pay | Admitting: Internal Medicine

## 2015-03-02 VITALS — BP 118/60 | HR 57 | Temp 98.5°F | Ht 65.0 in | Wt 216.0 lb

## 2015-03-02 DIAGNOSIS — M5440 Lumbago with sciatica, unspecified side: Secondary | ICD-10-CM

## 2015-03-02 DIAGNOSIS — M1990 Unspecified osteoarthritis, unspecified site: Secondary | ICD-10-CM

## 2015-03-02 DIAGNOSIS — D649 Anemia, unspecified: Secondary | ICD-10-CM

## 2015-03-02 DIAGNOSIS — R253 Fasciculation: Secondary | ICD-10-CM

## 2015-03-02 DIAGNOSIS — G4733 Obstructive sleep apnea (adult) (pediatric): Secondary | ICD-10-CM | POA: Diagnosis not present

## 2015-03-02 DIAGNOSIS — I10 Essential (primary) hypertension: Secondary | ICD-10-CM | POA: Diagnosis not present

## 2015-03-02 DIAGNOSIS — R258 Other abnormal involuntary movements: Secondary | ICD-10-CM

## 2015-03-02 DIAGNOSIS — R5383 Other fatigue: Secondary | ICD-10-CM | POA: Diagnosis not present

## 2015-03-02 DIAGNOSIS — E669 Obesity, unspecified: Secondary | ICD-10-CM

## 2015-03-02 DIAGNOSIS — Z658 Other specified problems related to psychosocial circumstances: Secondary | ICD-10-CM

## 2015-03-02 DIAGNOSIS — Z0000 Encounter for general adult medical examination without abnormal findings: Secondary | ICD-10-CM

## 2015-03-02 DIAGNOSIS — E7800 Pure hypercholesterolemia, unspecified: Secondary | ICD-10-CM

## 2015-03-02 DIAGNOSIS — F439 Reaction to severe stress, unspecified: Secondary | ICD-10-CM

## 2015-03-02 DIAGNOSIS — M25522 Pain in left elbow: Secondary | ICD-10-CM

## 2015-03-02 DIAGNOSIS — R1310 Dysphagia, unspecified: Secondary | ICD-10-CM

## 2015-03-02 DIAGNOSIS — E039 Hypothyroidism, unspecified: Secondary | ICD-10-CM

## 2015-03-02 MED ORDER — AMLODIPINE BESYLATE 5 MG PO TABS: 5.0000 mg | ORAL_TABLET | Freq: Every day | ORAL | Status: DC

## 2015-03-02 NOTE — Progress Notes (Signed)
Patient ID: CORBYN STEEDMAN, female   DOB: 05-25-1931, 81 y.o.   MRN: [id number]   Subjective:    Patient ID: [name], female    DOB: [birth year], 81 y.o.   MRN: [id number]  HPI  Patient here for a scheduled follow up.  Previously had urinary tract infection.  Treated with abx.  Urinary symptoms resolved.  No nausea or vomiting now.  Is eating.  Still with stomach issues.  Was unable to have the EGD secondary to being sick.  This is being rescheduled.  She reports fatigue.  She has cut her hydrocodone down to bid dosing.  Has f/u with Dr Farrell Ours on 03/15/15.  Elbow is better s/p injection - per Dr Jefm Bryant.  Still with increased back pain.  Limits her mobility and activity.  She request referral to see Dr Glenna Fellows - to get his opinion regarding further treatment options.  She also reports persistent increased muscle twitching and jerking.  Electrolytes are wnl.     Past Medical History  Diagnosis Date  . Osteoarthritis     knees/cervical and lumbar spine  . Hypertension   . Hyperlipidemia   . Hypothyroidism   . Anemia   . Depression   . Anxiety   . Nephrolithiasis   . Interstitial cystitis   . Pulmonary nodules     followed by Dr Raul Del  . Fatty liver   . Renal cyst     right  . Pulmonary hypertension   . Headache   . DVT (deep venous thrombosis)   . Fatty liver   . DDD (degenerative disc disease), cervical     Current Outpatient Prescriptions on File Prior to Visit  Medication Sig Dispense Refill  . aluminum-magnesium hydroxide-simethicone (MAALOX) 295-284-13 MG/5ML SUSP Take by mouth.    Marland Kitchen atorvastatin (LIPITOR) 20 MG tablet TAKE ONE TABLET AT BEDTIME 90 tablet 2  . benazepril (LOTENSIN) 40 MG tablet TAKE ONE (1) TABLET EACH DAY 90 tablet 1  . Calcium-Magnesium-Vitamin D 600-40-500 MG-MG-UNIT TB24 Take 630 mg by mouth 4 (four) times daily.    [name] Sodium 30-100 MG CAPS Take by mouth.    . Cholecalciferol (VITAMIN D3) 1000 UNITS CAPS Take by mouth.    .  DULoxetine (CYMBALTA) 30 MG capsule   0  . fluticasone (FLONASE) 50 MCG/ACT nasal spray Place 2 sprays into the nose daily.    . furosemide (LASIX) 20 MG tablet TAKE ONE (1) TABLET EACH DAY AS NEEDED 30 tablet 6  . gabapentin (NEURONTIN) 300 MG capsule Take 300 mg by mouth 3 (three) times daily.    . hydrochlorothiazide (HYDRODIURIL) 25 MG tablet TAKE ONE (1) TABLET EACH DAY 90 tablet 1  . HYDROcodone-acetaminophen (NORCO) 10-325 MG per tablet Take 1 tablet by mouth every 4 (four) hours as needed.    . hydrOXYzine (ATARAX/VISTARIL) 25 MG tablet Take 25 mg by mouth at bedtime.    . Inulin (FIBER CHOICE PO) Take 2 tablets by mouth daily. Chew tablets    . magnesium oxide (MAG-OX) 400 MG tablet Take 1 tablet (400 mg total) by mouth daily. 30 tablet 1  . metoprolol succinate (TOPROL-XL) 100 MG 24 hr tablet TAKE ONE TABLET TWICE DAILY. TAKE WITH OR IMMEDIATELY FOLLOWING A MEAL 180 tablet 3  . morphine (MS CONTIN) 60 MG 12 hr tablet Take 60 mg by mouth every 8 (eight) hours.    . Multiple Vitamins-Minerals (CENTRUM SILVER ADULT 50+ PO) Take by mouth daily.    Marland Kitchen nystatin cream (MYCOSTATIN)  Apply topically 2 (two) times daily. 30 g 1  . ondansetron (ZOFRAN-ODT) 4 MG disintegrating tablet Take 4 mg by mouth every 8 (eight) hours as needed for nausea or vomiting.    . pantoprazole (PROTONIX) 40 MG tablet Take 40 mg by mouth 2 (two) times daily.    . polyethylene glycol powder (GLYCOLAX/MIRALAX) powder MIX 17 GRAMS (AS MARKED IN BOTTLE TOP) IN 8 OUNCES OF WATER, MIX AND DRINK ONCEA DAY AS DIRECTED. 527 g 2  . Probiotic Product (ALIGN PO) Take by mouth daily.    . Simethicone (GAS-X PO) Take by mouth.    . sodium chloride (OCEAN) 0.65 % nasal spray Place 1 spray into the nose as needed.    Marland Kitchen SYNTHROID 112 MCG tablet TAKE ONE (1) TABLET EACH DAY 90 tablet 2  . zolpidem (AMBIEN) 10 MG tablet Take 10 mg by mouth at bedtime as needed for sleep.     No current facility-administered medications on file prior to  visit.    Review of Systems  Constitutional: Positive for appetite change and fatigue.  HENT: Negative for congestion and sinus pressure.   Respiratory: Negative for cough, chest tightness and shortness of breath.   Cardiovascular: Negative for chest pain, palpitations and leg swelling.  Gastrointestinal: Positive for nausea. Negative for vomiting, abdominal pain and diarrhea.  Genitourinary: Negative for dysuria and difficulty urinating.  Musculoskeletal: Positive for back pain.  Skin: Negative for color change and rash.  Neurological: Negative for dizziness, light-headedness and headaches.  Hematological: Negative for adenopathy. Does not bruise/bleed easily.  Psychiatric/Behavioral: Negative for dysphoric mood and agitation.       Better, since seeing Dr Nicolasa Ducking.        Objective:    Physical Exam  Constitutional: She appears well-developed and well-nourished. No distress.  HENT:  Nose: Nose normal.  Mouth/Throat: Oropharynx is clear and moist.  Neck: Neck supple. No thyromegaly present.  Cardiovascular: Normal rate and regular rhythm.   Pulmonary/Chest: Breath sounds normal. No respiratory distress. She has no wheezes.  Abdominal: Soft. Bowel sounds are normal. There is no tenderness.  Musculoskeletal: She exhibits no edema or tenderness.  Lymphadenopathy:    She has no cervical adenopathy.  Skin: No rash noted. No erythema.  Psychiatric: She has a normal mood and affect. Her behavior is normal.    BP 118/60 mmHg  Pulse 57  Temp(Src) 98.5 F (36.9 C) (Oral)  Ht _0  (1.651 m)  Wt 216 lb (97.977 kg)  BMI 35.94 kg/m2  SpO2 94% Wt Readings from Last 3 Encounters:  03/02/15 216 lb (97.977 kg)  02/10/15 214 lb 6 oz (97.24 kg)  01/13/15 220 lb (99.791 kg)     Lab Results  Component Value Date   WBC 8.9 02/10/2015   HGB 10.8* 02/10/2015   HCT 32.7* 02/10/2015   PLT 172.0 02/10/2015   GLUCOSE 90 02/10/2015   CHOL 136 10/19/2014   TRIG 136.0 10/19/2014   HDL  36.50* 10/19/2014   LDLCALC 72 10/19/2014   ALT 13 10/19/2014   AST 19 10/19/2014   NA 137 02/10/2015   K 4.0 02/10/2015   CL 100 02/10/2015   CREATININE 1.01 02/10/2015   BUN 22 02/10/2015   CO2 30 02/10/2015   TSH 1.10 02/10/2015   INR 1.0 12/08/2013   HGBA1C 6.0 10/19/2014       Assessment & Plan:   Problem List Items Addressed This Visit    Anemia    Seeing GI.  Last hgb stable.  Follow  cbc.        Relevant Orders   CBC with Differential/Platelet   Ferritin   Back pain    Persistent back pain and pain in her legs.  Has been evaluated by ortho and pain clinic.  Desires to see neurosurgery.  Make sure appt scheduled.  Want to see Dr Glenna Fellows.       Relevant Orders   Ambulatory referral to Neurosurgery   Dysphagia    Followed by GI.  Planning for EGD.        Fatigue - Primary    Feel probably multifactorial.  F/u with GI.  Continue w/u with GI.  Follow.       Health care maintenance    Physical 02/23/14.  Mammogram 08/24/14 - Birads I.  Sees GI.       Hypercholesterolemia    Low cholesterol diet and exercise.  Follow lipid panel and liver function tests.  On lipitor.        Relevant Medications   amLODipine (NORVASC) 5 MG tablet   Other Relevant Orders   Lipid panel   Hepatic function panel   Hypertension    Blood pressure doing well.  Same medication regimen.  Follow pressures.  Follow metabolic panel.        Relevant Medications   amLODipine (NORVASC) 5 MG tablet   Other Relevant Orders   Basic metabolic panel   Hypothyroidism    On thyroid replacement.  Follow tsh.       Left elbow pain    Better s/p injection from Dr Jefm Bryant.        Muscle twitching    Persistent muscle twitching and jerking.  Will have neurology evaluate.  Electrolytes wnl.        Relevant Orders   Ambulatory referral to Neurology   Obesity (BMI 30-39.9)    Discussed diet and exercise.        OSA (obstructive sleep apnea)    CPAP.       Osteoarthritis     Continue f/u at pain clinic.  Has been seeing Dr Marry Guan as well.        Stress    Followed by Dr Nicolasa Ducking.         I spent 25 minutes with the patient and more than 50% of the time was spent in consultation regarding the above.     Einar Pheasant, MD

## 2015-03-02 NOTE — Progress Notes (Signed)
Pre visit review using our clinic review tool, if applicable. No additional management support is needed unless otherwise documented below in the visit note.

## 2015-03-05 ENCOUNTER — Encounter: Payer: Self-pay | Admitting: Internal Medicine

## 2015-03-05 DIAGNOSIS — R253 Fasciculation: Secondary | ICD-10-CM | POA: Insufficient documentation

## 2015-03-05 NOTE — Assessment & Plan Note (Signed)
Physical 02/23/14.  Mammogram 08/24/14 - Birads I.  Sees GI.  

## 2015-03-05 NOTE — Assessment & Plan Note (Signed)
On thyroid replacement.  Follow tsh.  

## 2015-03-05 NOTE — Assessment & Plan Note (Signed)
Discussed diet and exercise 

## 2015-03-05 NOTE — Assessment & Plan Note (Signed)
CPAP.

## 2015-03-05 NOTE — Assessment & Plan Note (Signed)
Followed by GI.  Planning for EGD.

## 2015-03-05 NOTE — Assessment & Plan Note (Signed)
Persistent muscle twitching and jerking.  Will have neurology evaluate.  Electrolytes wnl.

## 2015-03-05 NOTE — Assessment & Plan Note (Signed)
Low cholesterol diet and exercise.  Follow lipid panel and liver function tests.  On lipitor.

## 2015-03-05 NOTE — Assessment & Plan Note (Signed)
Feel probably multifactorial.  F/u with GI.  Continue w/u with GI.  Follow.

## 2015-03-05 NOTE — Assessment & Plan Note (Signed)
Seeing GI.  Last hgb stable.  Follow cbc.

## 2015-03-05 NOTE — Assessment & Plan Note (Signed)
Better s/p injection from Dr Jefm Bryant.

## 2015-03-05 NOTE — Assessment & Plan Note (Signed)
Continue f/u at pain clinic.  Has been seeing Dr Marry Guan as well.

## 2015-03-05 NOTE — Assessment & Plan Note (Signed)
Blood pressure doing well.  Same medication regimen.  Follow pressures.  Follow metabolic panel.

## 2015-03-05 NOTE — Assessment & Plan Note (Signed)
Persistent back pain and pain in her legs.  Has been evaluated by ortho and pain clinic.  Desires to see neurosurgery.  Make sure appt scheduled.  Want to see Dr Glenna Fellows.

## 2015-03-05 NOTE — Assessment & Plan Note (Signed)
Followed by Dr Nicolasa Ducking.

## 2015-03-08 ENCOUNTER — Ambulatory Visit (INDEPENDENT_AMBULATORY_CARE_PROVIDER_SITE_OTHER): Payer: PPO

## 2015-03-08 VITALS — BP 124/62 | HR 67 | Temp 97.8°F | Resp 14 | Ht 62.5 in | Wt 215.4 lb

## 2015-03-08 DIAGNOSIS — Z0000 Encounter for general adult medical examination without abnormal findings: Secondary | ICD-10-CM | POA: Diagnosis not present

## 2015-03-08 NOTE — Progress Notes (Deleted)
Subjective:   Kimberly Roy is a 81 y.o. female who presents for Medicare Annual (Subsequent) preventive examination.  Review of Systems:  ***       Objective:     Vitals: BP 124/62 mmHg  Pulse 67  Temp(Src) 97.8 F (36.6 C) (Oral)  Resp 14  Ht 5' 2.5" (1.588 m)  Wt 215 lb 6.4 oz (97.705 kg)  BMI 38.75 kg/m2  SpO2 95%  Tobacco History  Smoking status  . Never Smoker   Smokeless tobacco  . Never Used     Counseling given: No   Past Medical History  Diagnosis Date  . Osteoarthritis     knees/cervical and lumbar spine  . Hypertension   . Hyperlipidemia   . Hypothyroidism   . Anemia   . Depression   . Anxiety   . Nephrolithiasis   . Interstitial cystitis   . Pulmonary nodules     followed by Dr Raul Del  . Fatty liver   . Renal cyst     right  . Pulmonary hypertension   . Headache   . DVT (deep venous thrombosis)   . Fatty liver   . DDD (degenerative disc disease), cervical    Past Surgical History  Procedure Laterality Date  . Breast reduction surgery      3/99  . Back surgeries    . Appendectomy    . Cervical spine surgery    . Tonsilectomy/adenoidectomy with myringotomy    . Excisional hemorrhoidectomy    . Rotator cuff surgery      blilateral  . Knee arthroscopy      left and right  . Abdominal hysterectomy      ovaries left in place  . Cataracts Bilateral   . Hemorrhoid surgery    . Esophagogastroduodenoscopy N/A 02/27/2015    Procedure: ESOPHAGOGASTRODUODENOSCOPY (EGD);  Surgeon: Hulen Luster, MD;  Location: [facility] ENDOSCOPY;  Service: Gastroenterology;  Laterality: N/A;  . Hip surgery  2013    Right hip surgery   Family History  Problem Relation Age of Onset  . Heart disease Mother   . Stroke Mother   . Heart disease Father     myocardial infarction age 14  . Hypertension Mother    History  Sexual Activity  . Sexual Activity: Not Currently    Outpatient Encounter Prescriptions as of 03/08/2015  Medication Sig  . aluminum-magnesium  hydroxide-simethicone (MAALOX) 993-570-17 MG/5ML SUSP Take by mouth.  Marland Kitchen amLODipine (NORVASC) 5 MG tablet Take 1 tablet (5 mg total) by mouth daily.  Marland Kitchen atorvastatin (LIPITOR) 20 MG tablet TAKE ONE TABLET AT BEDTIME  . benazepril (LOTENSIN) 40 MG tablet TAKE ONE (1) TABLET EACH DAY  . Calcium-Magnesium-Vitamin D 600-40-500 MG-MG-UNIT TB24 Take 630 mg by mouth 4 (four) times daily.  [name] Sodium 30-100 MG CAPS Take by mouth.  . Cholecalciferol (VITAMIN D3) 1000 UNITS CAPS Take by mouth.  . DULoxetine (CYMBALTA) 30 MG capsule   . fluticasone (FLONASE) 50 MCG/ACT nasal spray Place 2 sprays into the nose daily.  . furosemide (LASIX) 20 MG tablet TAKE ONE (1) TABLET EACH DAY AS NEEDED  . gabapentin (NEURONTIN) 300 MG capsule Take 300 mg by mouth 3 (three) times daily.  . hydrochlorothiazide (HYDRODIURIL) 25 MG tablet TAKE ONE (1) TABLET EACH DAY  . HYDROcodone-acetaminophen (NORCO) 10-325 MG per tablet Take 1 tablet by mouth every 4 (four) hours as needed.  . hydrOXYzine (ATARAX/VISTARIL) 25 MG tablet Take 25 mg by mouth at bedtime.  . Inulin (  FIBER CHOICE PO) Take 2 tablets by mouth daily. Chew tablets  . magnesium oxide (MAG-OX) 400 MG tablet Take 1 tablet (400 mg total) by mouth daily.  . metoprolol succinate (TOPROL-XL) 100 MG 24 hr tablet TAKE ONE TABLET TWICE DAILY. TAKE WITH OR IMMEDIATELY FOLLOWING A MEAL  . morphine (MS CONTIN) 60 MG 12 hr tablet Take 60 mg by mouth every 8 (eight) hours.  . Multiple Vitamins-Minerals (CENTRUM SILVER ADULT 50+ PO) Take by mouth daily.  Marland Kitchen nystatin cream (MYCOSTATIN) Apply topically 2 (two) times daily.  . ondansetron (ZOFRAN-ODT) 4 MG disintegrating tablet Take 4 mg by mouth every 8 (eight) hours as needed for nausea or vomiting.  . pantoprazole (PROTONIX) 40 MG tablet Take 40 mg by mouth 2 (two) times daily.  . polyethylene glycol powder (GLYCOLAX/MIRALAX) powder MIX 17 GRAMS (AS MARKED IN BOTTLE TOP) IN 8 OUNCES OF WATER, MIX AND DRINK  ONCEA DAY AS DIRECTED.  . Probiotic Product (ALIGN PO) Take by mouth daily.  . Simethicone (GAS-X PO) Take by mouth.  . sodium chloride (OCEAN) 0.65 % nasal spray Place 1 spray into the nose as needed.  Marland Kitchen SYNTHROID 112 MCG tablet TAKE ONE (1) TABLET EACH DAY  . zolpidem (AMBIEN) 10 MG tablet Take 10 mg by mouth at bedtime as needed for sleep.   No facility-administered encounter medications on file as of 03/08/2015.    Activities of Daily Living In your present state of health, do you have any difficulty performing the following activities: 03/08/2015  Hearing? Y  Vision? Y  Difficulty concentrating or making decisions? N  Walking or climbing stairs? Y  Dressing or bathing? N  Doing errands, shopping? Y    Patient Care Team: Einar Pheasant, [name], MD as Attending Physician (Cardiology)    Assessment:    *** Exercise Activities and Dietary recommendations    Goals    . Increase physical activity     Return to physical therapy in 4-6 weeks at [facility], focusing on parallel bars for 30 minutes a day, 3 days a week.      Fall Risk Fall Risk  03/08/2015 02/23/2014 08/10/2013  Falls in the past year? No No Yes  Number falls in past yr: - - 1  Risk for fall due to : Impaired balance/gait - -   Depression Screen PHQ 2/9 Scores 03/08/2015 02/23/2014 08/10/2013 07/03/2012  PHQ - 2 Score 1 0 0 0     Cognitive Testing No flowsheet data found.  Immunization History  Administered Date(s) Administered  . Influenza Split 07/13/2009, 07/03/2012  . Influenza,inj,Quad PF,36+ Mos 05/25/2013, 05/31/2014  . Pneumococcal Conjugate-13 11/29/2014  . Pneumococcal-Unspecified 06/22/2001   Screening Tests Health Maintenance  Topic Date Due  . TETANUS/TDAP  03/04/1952  . ZOSTAVAX  03/04/1993  . INFLUENZA VACCINE  04/03/2015  . MAMMOGRAM  08/25/2015  . COLONOSCOPY  06/03/2022  . DEXA SCAN  Completed  . PNA vac Low Risk Adult  Completed        Plan:   *** During the course of the visit the patient was educated and counseled about the following appropriate screening and preventive services:   Vaccines to include Pneumoccal, Influenza, Hepatitis B, Td, Zostavax, HCV  Electrocardiogram  Cardiovascular Disease  Colorectal cancer screening  Bone density screening  Diabetes screening  Glaucoma screening  Mammography/PAP  Nutrition counseling   Patient Instructions (the written plan) was given to the patient.   Varney Biles, LPN  04/07/3816

## 2015-03-08 NOTE — Progress Notes (Signed)
Subjective:   Kimberly Roy is a 81 y.o. female who presents for Medicare Annual (Subsequent) preventive examination.  Review of Systems:    No ROS. Medicare Wellness Visit.     Objective:     Vitals: BP 124/62 mmHg  Pulse 67  Temp(Src) 97.8 F (36.6 C) (Oral)  Resp 14  Ht 5' 2.5" (1.588 m)  Wt 215 lb 6.4 oz (97.705 kg)  BMI 38.75 kg/m2  SpO2 95%  Tobacco History  Smoking status  . Never Smoker   Smokeless tobacco  . Never Used     Counseling given: No   Past Medical History  Diagnosis Date  . Osteoarthritis     knees/cervical and lumbar spine  . Hypertension   . Hyperlipidemia   . Hypothyroidism   . Anemia   . Depression   . Anxiety   . Nephrolithiasis   . Interstitial cystitis   . Pulmonary nodules     followed by Dr Raul Del  . Fatty liver   . Renal cyst     right  . Pulmonary hypertension   . Headache   . DVT (deep venous thrombosis)   . Fatty liver   . DDD (degenerative disc disease), cervical    Past Surgical History  Procedure Laterality Date  . Breast reduction surgery      3/99  . Back surgeries    . Appendectomy    . Cervical spine surgery    . Tonsilectomy/adenoidectomy with myringotomy    . Excisional hemorrhoidectomy    . Rotator cuff surgery      blilateral  . Knee arthroscopy      left and right  . Abdominal hysterectomy      ovaries left in place  . Cataracts Bilateral   . Hemorrhoid surgery    . Esophagogastroduodenoscopy N/A 02/27/2015    Procedure: ESOPHAGOGASTRODUODENOSCOPY (EGD);  Surgeon: Hulen Luster, MD;  Location: [facility] ENDOSCOPY;  Service: Gastroenterology;  Laterality: N/A;  . Hip surgery  2013    Right hip surgery   Family History  Problem Relation Age of Onset  . Heart disease Mother   . Stroke Mother   . Heart disease Father     myocardial infarction age 33  . Hypertension Mother    History  Sexual Activity  . Sexual Activity: Not Currently    Outpatient Encounter Prescriptions as of 03/08/2015    Medication Sig  . aluminum-magnesium hydroxide-simethicone (MAALOX) 574-734-03 MG/5ML SUSP Take by mouth.  Marland Kitchen amLODipine (NORVASC) 5 MG tablet Take 1 tablet (5 mg total) by mouth daily.  Marland Kitchen atorvastatin (LIPITOR) 20 MG tablet TAKE ONE TABLET AT BEDTIME  . benazepril (LOTENSIN) 40 MG tablet TAKE ONE (1) TABLET EACH DAY  . Calcium-Magnesium-Vitamin D 600-40-500 MG-MG-UNIT TB24 Take 630 mg by mouth 4 (four) times daily.  [name] Sodium 30-100 MG CAPS Take by mouth.  . Cholecalciferol (VITAMIN D3) 1000 UNITS CAPS Take by mouth.  . DULoxetine (CYMBALTA) 30 MG capsule   . fluticasone (FLONASE) 50 MCG/ACT nasal spray Place 2 sprays into the nose daily.  . furosemide (LASIX) 20 MG tablet TAKE ONE (1) TABLET EACH DAY AS NEEDED  . gabapentin (NEURONTIN) 300 MG capsule Take 300 mg by mouth 3 (three) times daily.  . hydrochlorothiazide (HYDRODIURIL) 25 MG tablet TAKE ONE (1) TABLET EACH DAY  . HYDROcodone-acetaminophen (NORCO) 10-325 MG per tablet Take 1 tablet by mouth every 4 (four) hours as needed.  . hydrOXYzine (ATARAX/VISTARIL) 25 MG tablet Take 25 mg by mouth  at bedtime.  . Inulin (FIBER CHOICE PO) Take 2 tablets by mouth daily. Chew tablets  . magnesium oxide (MAG-OX) 400 MG tablet Take 1 tablet (400 mg total) by mouth daily.  . metoprolol succinate (TOPROL-XL) 100 MG 24 hr tablet TAKE ONE TABLET TWICE DAILY. TAKE WITH OR IMMEDIATELY FOLLOWING A MEAL  . morphine (MS CONTIN) 60 MG 12 hr tablet Take 60 mg by mouth every 8 (eight) hours.  . Multiple Vitamins-Minerals (CENTRUM SILVER ADULT 50+ PO) Take by mouth daily.  Marland Kitchen nystatin cream (MYCOSTATIN) Apply topically 2 (two) times daily.  . ondansetron (ZOFRAN-ODT) 4 MG disintegrating tablet Take 4 mg by mouth every 8 (eight) hours as needed for nausea or vomiting.  . pantoprazole (PROTONIX) 40 MG tablet Take 40 mg by mouth 2 (two) times daily.  . polyethylene glycol powder (GLYCOLAX/MIRALAX) powder MIX 17 GRAMS (AS MARKED IN BOTTLE TOP)  IN 8 OUNCES OF WATER, MIX AND DRINK ONCEA DAY AS DIRECTED.  . Probiotic Product (ALIGN PO) Take by mouth daily.  . Simethicone (GAS-X PO) Take by mouth.  . sodium chloride (OCEAN) 0.65 % nasal spray Place 1 spray into the nose as needed.  Marland Kitchen SYNTHROID 112 MCG tablet TAKE ONE (1) TABLET EACH DAY  . zolpidem (AMBIEN) 10 MG tablet Take 10 mg by mouth at bedtime as needed for sleep.   No facility-administered encounter medications on file as of 03/08/2015.    Activities of Daily Living In your present state of health, do you have any difficulty performing the following activities: 03/08/2015  Hearing? Y  Vision? Y  Difficulty concentrating or making decisions? N  Walking or climbing stairs? Y  Dressing or bathing? N  Doing errands, shopping? Y    Patient Care Team: Einar Pheasant, [name], [name], [name], MD (Otolaryngology)    Assessment:      Exercise Activities and Dietary recommendations   Patient currently uses a walker to ambulate. Hx of knee and hip surgeries.    Home exercise routine, limited. Type of exercise: walking.  Patient encouraged to continue a healthy diet lifestyle.  Goals    . Increase physical activity     Patient desires to return to physical therapy in 4-6 weeks at [facility], focusing on parallel bars for 30 minutes a day, 3 days a week.      Fall Risk Falls in the past year? No Number of falls in 1 year? 0 Depression Screen PHQ 2/9 Scores 03/08/2015 02/23/2014 08/10/2013 07/03/2012  PHQ - 2 Score 1 0 0 0     Cognitive Testing  MMSE - Mini Mental State Exam 03/08/2015  Orientation to time 5  Orientation to Place 5  Registration 3  Attention/ Calculation 5  Recall 3  Language- name 2 objects 2  Language- repeat 1  Language- follow 3 step command 3  Language- read & follow direction 1  Write a sentence 1  Copy design 1  Total score  30    Immunization History  Administered Date(s) Administered  . Influenza Split 07/13/2009, 07/03/2012  . Influenza,inj,Quad PF,36+ Mos 05/25/2013, 05/31/2014  . Pneumococcal Conjugate-13 11/29/2014  . Pneumococcal-Unspecified 06/22/2001   Screening Tests Health Maintenance  Topic Date Due  . ZOSTAVAX  03/07/2016 (Originally 03/04/1993)  . TETANUS/TDAP  03/07/2016 (Originally 03/04/1950)  . INFLUENZA VACCINE  04/03/2015  . MAMMOGRAM  08/25/2015  . COLONOSCOPY  06/03/2022  . DEXA SCAN  Completed  . PNA  vac Low Risk Adult  Completed      Plan:    During the course of the visit the patient was educated and counseled about the following appropriate screening and preventive services:   Vaccines to include Pneumoccal, Influenza, Hepatitis B, Td, Zostavax, HCV  Electrocardiogram  Cardiovascular Disease  Colorectal cancer screening  Bone density screening  Diabetes screening  Glaucoma screening  Mammography/PAP  Nutrition counseling   Additional Notes: Patient to follow up with PCP in regards to some abdominal pain.  Endoscopy completed at [facility] on 02/27/15, awaiting results.  -Patient failed hearing screening in R ear today. She reports loss of hearing to R ear 3 years ago.  Seen by ENT/Dr.Juengel.  -Educational information given on hearing loss, diet and fall prevention. -Follow up in 6 months for routine visit with PCP. TDAP postponed today for further discussion with PCP. -Zostavax postponed today for further discussion with PCP.  Patient Instructions (the written plan) was given to the patient.   Varney Biles, LPN  03/02/6966

## 2015-03-08 NOTE — Patient Instructions (Addendum)
An After Visit Summary was printed and given to the patient.  Thank you Kimberly Roy for taking the time to come for your Medicare Wellness Visit.  I appreciate your ongoing commitment to your health goals.  Please review the following plan we discussed and let me know if I can assist you in the future.  Follow up with PCP (Primary Care Provider) in regards to abdominal pain. Awaiting results of endoscopy completed at [facility] on 02/27/15.  Personal exercise goal of physical therapy 3 times weekly for 30 minutes.  Continue medication as prescribed by physician.  Make follow up appointment with PCP in 6 months for routine visit.      Health Maintenance Adopting a healthy lifestyle and getting preventive care can go a long way to promote health and wellness. Talk with your health care provider about what schedule of regular examinations is right for you. This is a good chance for you to check in with your provider about disease prevention and staying healthy. In between checkups, there are plenty of things you can do on your own. Experts have done a lot of research about which lifestyle changes and preventive measures are most likely to keep you healthy. Ask your health care provider for more information. WEIGHT AND DIET  Eat a healthy diet  Be sure to include plenty of vegetables, fruits, low-fat dairy products, and lean protein.  Do not eat a lot of foods high in solid fats, added sugars, or salt.  Get regular exercise. This is one of the most important things you can do for your health.  Most adults should exercise for at least 150 minutes each week. The exercise should increase your heart rate and make you sweat (moderate-intensity exercise).  Most adults should also do strengthening exercises at least twice a week. This is in addition to the moderate-intensity exercise.  Maintain a healthy weight  Body mass index (BMI) is a measurement that can be used to identify possible weight  problems. It estimates body fat based on height and weight. Your health care provider can help determine your BMI and help you achieve or maintain a healthy weight.  For females 39 years of age and older:   A BMI below 18.5 is considered underweight.  A BMI of 18.5 to 24.9 is normal.  A BMI of 25 to 29.9 is considered overweight.  A BMI of 30 and above is considered obese.  Watch levels of cholesterol and blood lipids  You should start having your blood tested for lipids and cholesterol at 79 years of age, then have this test every 5 years.  You may need to have your cholesterol levels checked more often if:  Your lipid or cholesterol levels are high.  You are older than 79 years of age.  You are at high risk for heart disease.  CANCER SCREENING   Lung Cancer  Lung cancer screening is recommended for adults 72-56 years old who are at high risk for lung cancer because of a history of smoking.  A yearly low-dose CT scan of the lungs is recommended for people who:  Currently smoke.  Have quit within the past 15 years.  Have at least a 30-pack-year history of smoking. A pack year is smoking an average of one pack of cigarettes a day for 1 year.  Yearly screening should continue until it has been 15 years since you quit.  Yearly screening should stop if you develop a health problem that would prevent you from having  lung cancer treatment.  Breast Cancer  Practice breast self-awareness. This means understanding how your breasts normally appear and feel.  It also means doing regular breast self-exams. Let your health care provider know about any changes, no matter how small.  If you are in your 20s or 30s, you should have a clinical breast exam (CBE) by a health care provider every 1-3 years as part of a regular health exam.  If you are 64 or older, have a CBE every year. Also consider having a breast X-ray (mammogram) every year.  If you have a family history of breast  cancer, talk to your health care provider about genetic screening.  If you are at high risk for breast cancer, talk to your health care provider about having an MRI and a mammogram every year.  Breast cancer gene (BRCA) assessment is recommended for women who have family members with BRCA-related cancers. BRCA-related cancers include:  Breast.  Ovarian.  Tubal.  Peritoneal cancers.  Results of the assessment will determine the need for genetic counseling and BRCA1 and BRCA2 testing. Cervical Cancer Routine pelvic examinations to screen for cervical cancer are no longer recommended for nonpregnant women who are considered low risk for cancer of the pelvic organs (ovaries, uterus, and vagina) and who do not have symptoms. A pelvic examination may be necessary if you have symptoms including those associated with pelvic infections. Ask your health care provider if a screening pelvic exam is right for you.   The Pap test is the screening test for cervical cancer for women who are considered at risk.  If you had a hysterectomy for a problem that was not cancer or a condition that could lead to cancer, then you no longer need Pap tests.  If you are older than 65 years, and you have had normal Pap tests for the past 10 years, you no longer need to have Pap tests.  If you have had past treatment for cervical cancer or a condition that could lead to cancer, you need Pap tests and screening for cancer for at least 20 years after your treatment.  If you no longer get a Pap test, assess your risk factors if they change (such as having a new sexual partner). This can affect whether you should start being screened again.  Some women have medical problems that increase their chance of getting cervical cancer. If this is the case for you, your health care provider may recommend more frequent screening and Pap tests.  The human papillomavirus (HPV) test is another test that may be used for cervical  cancer screening. The HPV test looks for the virus that can cause cell changes in the cervix. The cells collected during the Pap test can be tested for HPV.  The HPV test can be used to screen women 39 years of age and older. Getting tested for HPV can extend the interval between normal Pap tests from three to five years.  An HPV test also should be used to screen women of any age who have unclear Pap test results.  After 79 years of age, women should have HPV testing as often as Pap tests.  Colorectal Cancer  This type of cancer can be detected and often prevented.  Routine colorectal cancer screening usually begins at 79 years of age and continues through 79 years of age.  Your health care provider may recommend screening at an earlier age if you have risk factors for colon cancer.  Your health care provider  may also recommend using home test kits to check for hidden blood in the stool.  A small camera at the end of a tube can be used to examine your colon directly (sigmoidoscopy or colonoscopy). This is done to check for the earliest forms of colorectal cancer.  Routine screening usually begins at age 19.  Direct examination of the colon should be repeated every 5-10 years through 79 years of age. However, you may need to be screened more often if early forms of precancerous polyps or small growths are found. Skin Cancer  Check your skin from head to toe regularly.  Tell your health care provider about any new moles or changes in moles, especially if there is a change in a mole's shape or color.  Also tell your health care provider if you have a mole that is larger than the size of a pencil eraser.  Always use sunscreen. Apply sunscreen liberally and repeatedly throughout the day.  Protect yourself by wearing long sleeves, pants, a wide-brimmed hat, and sunglasses whenever you are outside. HEART DISEASE, DIABETES, AND HIGH BLOOD PRESSURE   Have your blood pressure checked at  least every 1-2 years. High blood pressure causes heart disease and increases the risk of stroke.  If you are between 59 years and 55 years old, ask your health care provider if you should take aspirin to prevent strokes.  Have regular diabetes screenings. This involves taking a blood sample to check your fasting blood sugar level.  If you are at a normal weight and have a low risk for diabetes, have this test once every three years after 79 years of age.  If you are overweight and have a high risk for diabetes, consider being tested at a younger age or more often. PREVENTING INFECTION  Hepatitis B  If you have a higher risk for hepatitis B, you should be screened for this virus. You are considered at high risk for hepatitis B if:  You were born in a country where hepatitis B is common. Ask your health care provider which countries are considered high risk.  Your parents were born in a high-risk country, and you have not been immunized against hepatitis B (hepatitis B vaccine).  You have HIV or AIDS.  You use needles to inject street drugs.  You live with someone who has hepatitis B.  You have had sex with someone who has hepatitis B.  You get hemodialysis treatment.  You take certain medicines for conditions, including cancer, organ transplantation, and autoimmune conditions. Hepatitis C  Blood testing is recommended for:  Everyone born from [birth year] through 1963.  Anyone with known risk factors for hepatitis C. Sexually transmitted infections (STIs)  You should be screened for sexually transmitted infections (STIs) including gonorrhea and chlamydia if:  You are sexually active and are younger than 79 years of age.  You are older than 79 years of age and your health care provider tells you that you are at risk for this type of infection.  Your sexual activity has changed since you were last screened and you are at an increased risk for chlamydia or gonorrhea. Ask your health  care provider if you are at risk.  If you do not have HIV, but are at risk, it may be recommended that you take a prescription medicine daily to prevent HIV infection. This is called pre-exposure prophylaxis (PrEP). You are considered at risk if:  You are sexually active and do not regularly use condoms or know the  HIV status of your partner(s).  You take drugs by injection.  You are sexually active with a partner who has HIV. Talk with your health care provider about whether you are at high risk of being infected with HIV. If you choose to begin PrEP, you should first be tested for HIV. You should then be tested every 3 months for as long as you are taking PrEP.  PREGNANCY   If you are premenopausal and you may become pregnant, ask your health care provider about preconception counseling.  If you may become pregnant, take 400 to 800 micrograms (mcg) of folic acid every day.  If you want to prevent pregnancy, talk to your health care provider about birth control (contraception). OSTEOPOROSIS AND MENOPAUSE   Osteoporosis is a disease in which the bones lose minerals and strength with aging. This can result in serious bone fractures. Your risk for osteoporosis can be identified using a bone density scan.  If you are 24 years of age or older, or if you are at risk for osteoporosis and fractures, ask your health care provider if you should be screened.  Ask your health care provider whether you should take a calcium or vitamin D supplement to lower your risk for osteoporosis.  Menopause may have certain physical symptoms and risks.  Hormone replacement therapy may reduce some of these symptoms and risks. Talk to your health care provider about whether hormone replacement therapy is right for you.  HOME CARE INSTRUCTIONS   Schedule regular health, dental, and eye exams.  Stay current with your immunizations.   Do not use any tobacco products including cigarettes, chewing tobacco, or  electronic cigarettes.  If you are pregnant, do not drink alcohol.  If you are breastfeeding, limit how much and how often you drink alcohol.  Limit alcohol intake to no more than 1 drink per day for nonpregnant women. One drink equals 12 ounces of beer, 5 ounces of wine, or 1 ounces of hard liquor.  Do not use street drugs.  Do not share needles.  Ask your health care provider for help if you need support or information about quitting drugs.  Tell your health care provider if you often feel depressed.  Tell your health care provider if you have ever been abused or do not feel safe at home. Document Released: 03/04/2011 Document Revised: 01/03/2014 Document Reviewed: 07/21/2013 [facility] Patient Information 2015 [place], [place]. This information is not intended to replace advice given to you by your health care provider. Make sure you discuss any questions you have with your health care provider.   Bone Densitometry Bone densitometry is a special X-ray that measures your bone density and can be used to help predict your risk of bone fractures. This test is used to determine bone mineral content and density to diagnose osteoporosis. Osteoporosis is the loss of bone that may cause the bone to become weak. Osteoporosis commonly occurs in women entering menopause. However, it may be found in men and in people with other diseases. PREPARATION FOR TEST No preparation necessary. WHO SHOULD BE TESTED?  All women older than 24.  Postmenopausal women (50 to 43) with risk factors for osteoporosis.  People with a previous fracture caused by normal activities.  People with a small body frame (less than 127 poundsor a body mass index [BMI] of less than 21).  People who have a parent with a hip fracture or history of osteoporosis.  People who smoke.  People who have rheumatoid arthritis.  Anyone  who engages in excessive alcohol use (more than 3 drinks most days).  Women who experience  early menopause. WHEN SHOULD YOU BE RETESTED? Current guidelines suggest that you should wait at least 2 years before doing a bone density test again if your first test was normal.Recent studies indicated that women with normal bone density may be able to wait a few years before needing to repeat a bone density test. You should discuss this with your caregiver.  NORMAL FINDINGS   Normal: less than standard deviation below normal (greater than -1).  Osteopenia: 1 to 2.5 standard deviations below normal (-1 to -2.5).  Osteoporosis: greater than 2.5 standard deviations below normal (less than -2.5). Test results are reported as a "T score" and a "Z score."The T score is a number that compares your bone density with the bone density of healthy, young women.The Z score is a number that compares your bone density with the scores of women who are the same age, gender, and race.  Ranges for normal findings may vary among different laboratories and hospitals. You should always check with your doctor after having lab work or other tests done to discuss the meaning of your test results and whether your values are considered within normal limits. MEANING OF TEST  Your caregiver will go over the test results with you and discuss the importance and meaning of your results, as well as treatment options and the need for additional tests if necessary. OBTAINING THE TEST RESULTS It is your responsibility to obtain your test results. Ask the lab or department performing the test when and how you will get your results. Document Released: 09/10/2004 Document Revised: 11/11/2011 Document Reviewed: 10/03/2010 [facility] Patient Information 2015 [place], [place]. This information is not intended to replace advice given to you by your health care provider. Make sure you discuss any questions you have with your health care provider.  Fat and Cholesterol Control Diet Fat and cholesterol levels in your blood and organs are  influenced by your diet. High levels of fat and cholesterol may lead to diseases of the heart, small and large blood vessels, gallbladder, liver, and pancreas. CONTROLLING FAT AND CHOLESTEROL WITH DIET Although exercise and lifestyle factors are important, your diet is key. That is because certain foods are known to raise cholesterol and others to lower it. The goal is to balance foods for their effect on cholesterol and more importantly, to replace saturated and trans fat with other types of fat, such as monounsaturated fat, polyunsaturated fat, and omega-3 fatty acids. On average, a person should consume no more than 15 to 17 g of saturated fat daily. Saturated and trans fats are considered "bad" fats, and they will raise LDL cholesterol. Saturated fats are primarily found in animal products such as meats, butter, and cream. However, that does not mean you need to give up all your favorite foods. Today, there are good tasting, low-fat, low-cholesterol substitutes for most of the things you like to eat. Choose low-fat or nonfat alternatives. Choose round or loin cuts of red meat. These types of cuts are lowest in fat and cholesterol. Chicken (without the skin), fish, veal, and ground [place] breast are great choices. Eliminate fatty meats, such as hot dogs and salami. Even shellfish have little or no saturated fat. Have a 3 oz (85 g) portion when you eat lean meat, poultry, or fish. Trans fats are also called "partially hydrogenated oils." They are oils that have been scientifically manipulated so that they are solid at  room temperature resulting in a longer shelf life and improved taste and texture of foods in which they are added. Trans fats are found in stick margarine, some tub margarines, cookies, crackers, and baked goods.  When baking and cooking, oils are a great substitute for butter. The monounsaturated oils are especially beneficial since it is believed they lower LDL and raise HDL. The oils you  should avoid entirely are saturated tropical oils, such as coconut and palm.  Remember to eat a lot from food groups that are naturally free of saturated and trans fat, including fish, fruit, vegetables, beans, grains (barley, rice, couscous, bulgur wheat), and pasta (without cream sauces).  IDENTIFYING FOODS THAT LOWER FAT AND CHOLESTEROL  Soluble fiber may lower your cholesterol. This type of fiber is found in fruits such as apples, vegetables such as broccoli, potatoes, and carrots, legumes such as beans, peas, and lentils, and grains such as barley. Foods fortified with plant sterols (phytosterol) may also lower cholesterol. You should eat at least 2 g per day of these foods for a cholesterol lowering effect.  Read package labels to identify low-saturated fats, trans fat free, and low-fat foods at the supermarket. Select cheeses that have only 2 to 3 g saturated fat per ounce. Use a heart-healthy tub margarine that is free of trans fats or partially hydrogenated oil. When buying baked goods (cookies, crackers), avoid partially hydrogenated oils. Breads and muffins should be made from whole grains (whole-wheat or whole oat flour, instead of "flour" or "enriched flour"). Buy non-creamy canned soups with reduced salt and no added fats.  FOOD PREPARATION TECHNIQUES  Never deep-fry. If you must fry, either stir-fry, which uses very little fat, or use non-stick cooking sprays. When possible, broil, bake, or roast meats, and steam vegetables. Instead of putting butter or margarine on vegetables, use lemon and herbs, applesauce, and cinnamon (for squash and sweet potatoes). Use nonfat yogurt, salsa, and low-fat dressings for salads.  LOW-SATURATED FAT / LOW-FAT FOOD SUBSTITUTES Meats / Saturated Fat (g)  Avoid: Steak, marbled (3 oz/85 g) / 11 g  Choose: Steak, lean (3 oz/85 g) / 4 g  Avoid: Hamburger (3 oz/85 g) / 7 g  Choose: Hamburger, lean (3 oz/85 g) / 5 g  Avoid: Ham (3 oz/85 g) / 6 g  Choose:  Ham, lean cut (3 oz/85 g) / 2.4 g  Avoid: Chicken, with skin, dark meat (3 oz/85 g) / 4 g  Choose: Chicken, skin removed, dark meat (3 oz/85 g) / 2 g  Avoid: Chicken, with skin, light meat (3 oz/85 g) / 2.5 g  Choose: Chicken, skin removed, light meat (3 oz/85 g) / 1 g Dairy / Saturated Fat (g)  Avoid: Whole milk (1 cup) / 5 g  Choose: Low-fat milk, 2% (1 cup) / 3 g  Choose: Low-fat milk, 1% (1 cup) / 1.5 g  Choose: Skim milk (1 cup) / 0.3 g  Avoid: Hard cheese (1 oz/28 g) / 6 g  Choose: Skim milk cheese (1 oz/28 g) / 2 to 3 g  Avoid: Cottage cheese, 4% fat (1 cup) / 6.5 g  Choose: Low-fat cottage cheese, 1% fat (1 cup) / 1.5 g  Avoid: Ice cream (1 cup) / 9 g  Choose: Sherbet (1 cup) / 2.5 g  Choose: Nonfat frozen yogurt (1 cup) / 0.3 g  Choose: Frozen fruit bar / trace  Avoid: Whipped cream (1 tbs) / 3.5 g  Choose: Nondairy whipped topping (1 tbs) / 1 g Condiments /  Saturated Fat (g)  Avoid: Mayonnaise (1 tbs) / 2 g  Choose: Low-fat mayonnaise (1 tbs) / 1 g  Avoid: Butter (1 tbs) / 7 g  Choose: Extra light margarine (1 tbs) / 1 g  Avoid: Coconut oil (1 tbs) / 11.8 g  Choose: Olive oil (1 tbs) / 1.8 g  Choose: Corn oil (1 tbs) / 1.7 g  Choose: Safflower oil (1 tbs) / 1.2 g  Choose: Sunflower oil (1 tbs) / 1.4 g  Choose: Soybean oil (1 tbs) / 2.4 g  Choose: Canola oil (1 tbs) / 1 g Document Released: 08/19/2005 Document Revised: 12/14/2012 Document Reviewed: 11/17/2013 [employer] Patient Information 2015 [place], [place]. This information is not intended to replace advice given to you by your health care provider. Make sure you discuss any questions you have with your health care provider.  Fall Prevention and Home Safety Falls cause injuries and can affect all age groups. It is possible to prevent falls.  HOW TO PREVENT FALLS  Wear shoes with rubber soles that do not have an opening for your toes.  Keep the inside and outside of your house well  lit.  Use night lights throughout your home.  Remove clutter from floors.  Clean up floor spills.  Remove throw rugs or fasten them to the floor with carpet tape.  Do not place electrical cords across pathways.  Put grab bars by your tub, shower, and toilet. Do not use towel bars as grab bars.  Put handrails on both sides of the stairway. Fix loose handrails.  Do not climb on stools or stepladders, if possible.  Do not wax your floors.  Repair uneven or unsafe sidewalks, walkways, or stairs.  Keep items you use a lot within reach.  Be aware of pets.  Keep emergency numbers next to the telephone.  Put smoke detectors in your home and near bedrooms. Ask your doctor what other things you can do to prevent falls. Document Released: 06/15/2009 Document Revised: 02/18/2012 Document Reviewed: 11/19/2011 [facility] Patient Information 2015 [place], [place]. This information is not intended to replace advice given to you by your health care provider. Make sure you discuss any questions you have with your health care provider.  Health Maintenance Adopting a healthy lifestyle and getting preventive care can go a long way to promote health and wellness. Talk with your health care provider about what schedule of regular examinations is right for you. This is a good chance for you to check in with your provider about disease prevention and staying healthy. In between checkups, there are plenty of things you can do on your own. Experts have done a lot of research about which lifestyle changes and preventive measures are most likely to keep you healthy. Ask your health care provider for more information. WEIGHT AND DIET  Eat a healthy diet  Be sure to include plenty of vegetables, fruits, low-fat dairy products, and lean protein.  Do not eat a lot of foods high in solid fats, added sugars, or salt.  Get regular exercise. This is one of the most important things you can do for your  health.  Most adults should exercise for at least 150 minutes each week. The exercise should increase your heart rate and make you sweat (moderate-intensity exercise).  Most adults should also do strengthening exercises at least twice a week. This is in addition to the moderate-intensity exercise.  Maintain a healthy weight  Body mass index (BMI) is a measurement that can be used to  identify possible weight problems. It estimates body fat based on height and weight. Your health care provider can help determine your BMI and help you achieve or maintain a healthy weight.  For females 62 years of age and older:   A BMI below 18.5 is considered underweight.  A BMI of 18.5 to 24.9 is normal.  A BMI of 25 to 29.9 is considered overweight.  A BMI of 30 and above is considered obese.  Watch levels of cholesterol and blood lipids  You should start having your blood tested for lipids and cholesterol at 79 years of age, then have this test every 5 years.  You may need to have your cholesterol levels checked more often if:  Your lipid or cholesterol levels are high.  You are older than 79 years of age.  You are at high risk for heart disease.  CANCER SCREENING   Lung Cancer  Lung cancer screening is recommended for adults 20-66 years old who are at high risk for lung cancer because of a history of smoking.  A yearly low-dose CT scan of the lungs is recommended for people who:  Currently smoke.  Have quit within the past 15 years.  Have at least a 30-pack-year history of smoking. A pack year is smoking an average of one pack of cigarettes a day for 1 year.  Yearly screening should continue until it has been 15 years since you quit.  Yearly screening should stop if you develop a health problem that would prevent you from having lung cancer treatment.  Breast Cancer  Practice breast self-awareness. This means understanding how your breasts normally appear and feel.  It also  means doing regular breast self-exams. Let your health care provider know about any changes, no matter how small.  If you are in your 20s or 30s, you should have a clinical breast exam (CBE) by a health care provider every 1-3 years as part of a regular health exam.  If you are 46 or older, have a CBE every year. Also consider having a breast X-ray (mammogram) every year.  If you have a family history of breast cancer, talk to your health care provider about genetic screening.  If you are at high risk for breast cancer, talk to your health care provider about having an MRI and a mammogram every year.  Breast cancer gene (BRCA) assessment is recommended for women who have family members with BRCA-related cancers. BRCA-related cancers include:  Breast.  Ovarian.  Tubal.  Peritoneal cancers.  Results of the assessment will determine the need for genetic counseling and BRCA1 and BRCA2 testing. Cervical Cancer Routine pelvic examinations to screen for cervical cancer are no longer recommended for nonpregnant women who are considered low risk for cancer of the pelvic organs (ovaries, uterus, and vagina) and who do not have symptoms. A pelvic examination may be necessary if you have symptoms including those associated with pelvic infections. Ask your health care provider if a screening pelvic exam is right for you.   The Pap test is the screening test for cervical cancer for women who are considered at risk.  If you had a hysterectomy for a problem that was not cancer or a condition that could lead to cancer, then you no longer need Pap tests.  If you are older than 65 years, and you have had normal Pap tests for the past 10 years, you no longer need to have Pap tests.  If you have had past treatment for cervical  cancer or a condition that could lead to cancer, you need Pap tests and screening for cancer for at least 20 years after your treatment.  If you no longer get a Pap test, assess  your risk factors if they change (such as having a new sexual partner). This can affect whether you should start being screened again.  Some women have medical problems that increase their chance of getting cervical cancer. If this is the case for you, your health care provider may recommend more frequent screening and Pap tests.  The human papillomavirus (HPV) test is another test that may be used for cervical cancer screening. The HPV test looks for the virus that can cause cell changes in the cervix. The cells collected during the Pap test can be tested for HPV.  The HPV test can be used to screen women 47 years of age and older. Getting tested for HPV can extend the interval between normal Pap tests from three to five years.  An HPV test also should be used to screen women of any age who have unclear Pap test results.  After 79 years of age, women should have HPV testing as often as Pap tests.  Colorectal Cancer  This type of cancer can be detected and often prevented.  Routine colorectal cancer screening usually begins at 79 years of age and continues through 79 years of age.  Your health care provider may recommend screening at an earlier age if you have risk factors for colon cancer.  Your health care provider may also recommend using home test kits to check for hidden blood in the stool.  A small camera at the end of a tube can be used to examine your colon directly (sigmoidoscopy or colonoscopy). This is done to check for the earliest forms of colorectal cancer.  Routine screening usually begins at age 34.  Direct examination of the colon should be repeated every 5-10 years through 79 years of age. However, you may need to be screened more often if early forms of precancerous polyps or small growths are found. Skin Cancer  Check your skin from head to toe regularly.  Tell your health care provider about any new moles or changes in moles, especially if there is a change in a  mole's shape or color.  Also tell your health care provider if you have a mole that is larger than the size of a pencil eraser.  Always use sunscreen. Apply sunscreen liberally and repeatedly throughout the day.  Protect yourself by wearing long sleeves, pants, a wide-brimmed hat, and sunglasses whenever you are outside. HEART DISEASE, DIABETES, AND HIGH BLOOD PRESSURE   Have your blood pressure checked at least every 1-2 years. High blood pressure causes heart disease and increases the risk of stroke.  If you are between 41 years and 17 years old, ask your health care provider if you should take aspirin to prevent strokes.  Have regular diabetes screenings. This involves taking a blood sample to check your fasting blood sugar level.  If you are at a normal weight and have a low risk for diabetes, have this test once every three years after 79 years of age.  If you are overweight and have a high risk for diabetes, consider being tested at a younger age or more often. PREVENTING INFECTION  Hepatitis B  If you have a higher risk for hepatitis B, you should be screened for this virus. You are considered at high risk for hepatitis B if:  You were born in a country where hepatitis B is common. Ask your health care provider which countries are considered high risk.  Your parents were born in a high-risk country, and you have not been immunized against hepatitis B (hepatitis B vaccine).  You have HIV or AIDS.  You use needles to inject street drugs.  You live with someone who has hepatitis B.  You have had sex with someone who has hepatitis B.  You get hemodialysis treatment.  You take certain medicines for conditions, including cancer, organ transplantation, and autoimmune conditions. Hepatitis C  Blood testing is recommended for:  Everyone born from [birth year] through 1963.  Anyone with known risk factors for hepatitis C. Sexually transmitted infections (STIs)  You should be  screened for sexually transmitted infections (STIs) including gonorrhea and chlamydia if:  You are sexually active and are younger than 79 years of age.  You are older than 79 years of age and your health care provider tells you that you are at risk for this type of infection.  Your sexual activity has changed since you were last screened and you are at an increased risk for chlamydia or gonorrhea. Ask your health care provider if you are at risk.  If you do not have HIV, but are at risk, it may be recommended that you take a prescription medicine daily to prevent HIV infection. This is called pre-exposure prophylaxis (PrEP). You are considered at risk if:  You are sexually active and do not regularly use condoms or know the HIV status of your partner(s).  You take drugs by injection.  You are sexually active with a partner who has HIV. Talk with your health care provider about whether you are at high risk of being infected with HIV. If you choose to begin PrEP, you should first be tested for HIV. You should then be tested every 3 months for as long as you are taking PrEP.  PREGNANCY   If you are premenopausal and you may become pregnant, ask your health care provider about preconception counseling.  If you may become pregnant, take 400 to 800 micrograms (mcg) of folic acid every day.  If you want to prevent pregnancy, talk to your health care provider about birth control (contraception). OSTEOPOROSIS AND MENOPAUSE   Osteoporosis is a disease in which the bones lose minerals and strength with aging. This can result in serious bone fractures. Your risk for osteoporosis can be identified using a bone density scan.  If you are 61 years of age or older, or if you are at risk for osteoporosis and fractures, ask your health care provider if you should be screened.  Ask your health care provider whether you should take a calcium or vitamin D supplement to lower your risk for  osteoporosis.  Menopause may have certain physical symptoms and risks.  Hormone replacement therapy may reduce some of these symptoms and risks. Talk to your health care provider about whether hormone replacement therapy is right for you.  HOME CARE INSTRUCTIONS   Schedule regular health, dental, and eye exams.  Stay current with your immunizations.   Do not use any tobacco products including cigarettes, chewing tobacco, or electronic cigarettes.  If you are pregnant, do not drink alcohol.  If you are breastfeeding, limit how much and how often you drink alcohol.  Limit alcohol intake to no more than 1 drink per day for nonpregnant women. One drink equals 12 ounces of beer, 5 ounces of wine, or 1 ounces  of hard liquor.  Do not use street drugs.  Do not share needles.  Ask your health care provider for help if you need support or information about quitting drugs.  Tell your health care provider if you often feel depressed.  Tell your health care provider if you have ever been abused or do not feel safe at home. Document Released: 03/04/2011 Document Revised: 01/03/2014 Document Reviewed: 07/21/2013 [facility] Patient Information 2015 [place], [place]. This information is not intended to replace advice given to you by your health care provider. Make sure you discuss any questions you have with your health care provider.  Hearing Loss A hearing loss is sometimes called deafness. Hearing loss may be partial or total. CAUSES Hearing loss may be caused by:  Wax in the ear canal.  Infection of the ear canal.  Infection of the middle ear.  Trauma to the ear or surrounding area.  Fluid in the middle ear.  A hole in the eardrum (perforated eardrum).  Exposure to loud sounds or music.  Problems with the hearing nerve.  Certain medications. Hearing loss without wax, infection, or a history of injury may mean that the nerve is involved. Hearing loss with severe dizziness,  nausea and vomiting or ringing in the ear may suggest a hearing nerve irritation or problems in the middle or inner ear. If hearing loss is untreated, there is a greater likelihood for residual or permanent hearing loss. DIAGNOSIS A hearing test (audiometry) assesses hearing loss. The audiometry test needs to be performed by a hearing specialist (audiologist). TREATMENT Treatment for recent onset of hearing loss may include:  Ear wax removal.  Medications that kill germs (antibiotics).  Cortisone medications.  Prompt follow up with the appropriate specialist. Return of hearing depends on the cause of your hearing loss, so proper medical follow-up is important. Some hearing loss may not be reversible, and a caregiver should discuss care and treatment options with you. SEEK MEDICAL CARE IF:   You have a severe headache, dizziness, or changes in vision.  You have new or increased weakness.  You develop repeated vomiting or other serious medical problems.  You have a fever. Document Released: 08/19/2005 Document Revised: 11/11/2011 Document Reviewed: 12/14/2009 [facility] Patient Information 2015 [place], [place]. This information is not intended to replace advice given to you by your health care provider. Make sure you discuss any questions you have with your health care provider.

## 2015-03-12 ENCOUNTER — Encounter: Payer: Self-pay | Admitting: Internal Medicine

## 2015-04-03 ENCOUNTER — Encounter: Payer: Self-pay | Admitting: Internal Medicine

## 2015-04-19 ENCOUNTER — Encounter: Payer: Self-pay | Admitting: Internal Medicine

## 2015-04-20 ENCOUNTER — Encounter: Payer: Self-pay | Admitting: Internal Medicine

## 2015-04-25 ENCOUNTER — Encounter: Payer: Self-pay | Admitting: Internal Medicine

## 2015-04-27 ENCOUNTER — Ambulatory Visit (INDEPENDENT_AMBULATORY_CARE_PROVIDER_SITE_OTHER): Payer: PPO

## 2015-04-27 DIAGNOSIS — Z23 Encounter for immunization: Secondary | ICD-10-CM | POA: Diagnosis not present

## 2015-05-05 ENCOUNTER — Encounter: Payer: Self-pay | Admitting: Internal Medicine

## 2015-05-05 ENCOUNTER — Other Ambulatory Visit: Payer: Self-pay | Admitting: Neurology

## 2015-05-05 DIAGNOSIS — R2689 Other abnormalities of gait and mobility: Secondary | ICD-10-CM

## 2015-05-05 DIAGNOSIS — M6281 Muscle weakness (generalized): Secondary | ICD-10-CM

## 2015-05-06 NOTE — Telephone Encounter (Signed)
There is an opening on 05/11/15 at 11:00.  Please see if she can come in at this time and block 30 minute spot.  Thanks.

## 2015-05-07 NOTE — Telephone Encounter (Signed)
Pt has been scheduled for 05/11/15 @ 11am & notified via [employer]

## 2015-05-11 ENCOUNTER — Encounter: Payer: Self-pay | Admitting: Internal Medicine

## 2015-05-11 ENCOUNTER — Ambulatory Visit (INDEPENDENT_AMBULATORY_CARE_PROVIDER_SITE_OTHER): Payer: PPO | Admitting: Internal Medicine

## 2015-05-11 VITALS — BP 177/68 | HR 61 | Temp 98.0°F | Ht 65.0 in | Wt 212.1 lb

## 2015-05-11 DIAGNOSIS — M5440 Lumbago with sciatica, unspecified side: Secondary | ICD-10-CM

## 2015-05-11 DIAGNOSIS — I10 Essential (primary) hypertension: Secondary | ICD-10-CM | POA: Diagnosis not present

## 2015-05-11 DIAGNOSIS — R253 Fasciculation: Secondary | ICD-10-CM

## 2015-05-11 DIAGNOSIS — M542 Cervicalgia: Secondary | ICD-10-CM

## 2015-05-11 DIAGNOSIS — R1030 Lower abdominal pain, unspecified: Secondary | ICD-10-CM | POA: Diagnosis not present

## 2015-05-11 DIAGNOSIS — R1032 Left lower quadrant pain: Secondary | ICD-10-CM

## 2015-05-11 DIAGNOSIS — Z1239 Encounter for other screening for malignant neoplasm of breast: Secondary | ICD-10-CM

## 2015-05-11 DIAGNOSIS — M25552 Pain in left hip: Secondary | ICD-10-CM | POA: Diagnosis not present

## 2015-05-11 DIAGNOSIS — R258 Other abnormal involuntary movements: Secondary | ICD-10-CM

## 2015-05-11 NOTE — Progress Notes (Signed)
Patient ID: Kimberly Roy, female   DOB: 1930/11/28, 81 y.o.   MRN: [id number]   Subjective:    Patient ID: Kimberly Roy, female    DOB: October 29, 1930, 81 y.o.   MRN: [id number]  HPI  Patient here as a work in with concerns regarding increased back pain.  She had been having issues with increased neck pain.  Per her report, recently had CT neck and found to have bone spur and arthritis.  Due f/u in 06/2015 regarding her neck.  Has been seeing Dr Farrell Ours.  managing her pain.  She recently saw Dr Manuella Ghazi for muscle twitching.  See his note for details.  Had CT head that did not reveal any acute abnormality.  Planning for EEG.  Her main complaint today is that of worsening mid to lower back pain.  Has had chronic back pain.  Feels worsened.  Limiting her mobility and activity.  Wants to see Dr Carloyn Manner for second opinion.  They want more recent scan of her back.  She is unable to have MRI.  Agreeable for CT scan.   Also reports pain in her groin.  No vaginal pain.  No symptom change regarding her bladder.     Past Medical History  Diagnosis Date  . Osteoarthritis     knees/cervical and lumbar spine  . Hypertension   . Hyperlipidemia   . Hypothyroidism   . Anemia   . Depression   . Anxiety   . Nephrolithiasis   . Interstitial cystitis   . Pulmonary nodules     followed by Dr Raul Del  . Fatty liver   . Renal cyst     right  . Pulmonary hypertension   . Headache   . DVT (deep venous thrombosis)   . Fatty liver   . DDD (degenerative disc disease), cervical    Past Surgical History  Procedure Laterality Date  . Breast reduction surgery      3/99  . Back surgeries    . Appendectomy    . Cervical spine surgery    . Tonsilectomy/adenoidectomy with myringotomy    . Excisional hemorrhoidectomy    . Rotator cuff surgery      blilateral  . Knee arthroscopy      left and right  . Abdominal hysterectomy      ovaries left in place  . Cataracts Bilateral   . Hemorrhoid surgery    .  Esophagogastroduodenoscopy N/A 02/27/2015    Procedure: ESOPHAGOGASTRODUODENOSCOPY (EGD);  Surgeon: Hulen Luster, MD;  Location: [facility] ENDOSCOPY;  Service: Gastroenterology;  Laterality: N/A;  . Hip surgery  2013    Right hip surgery   Family History  Problem Relation Age of Onset  . Heart disease Mother   . Stroke Mother   . Heart disease Father     myocardial infarction age 33  . Hypertension Mother    Social History   Social History  . Marital Status: Married    Spouse Name: N/A  . Number of Children: 2  . Years of Education: N/A   Social History Main Topics  . Smoking status: Never Smoker   . Smokeless tobacco: Never Used  . Alcohol Use: No  . Drug Use: No  . Sexual Activity: Not Currently   Other Topics Concern  . None   Social History Narrative    Outpatient Encounter Prescriptions as of 05/11/2015  Medication Sig  . aluminum-magnesium hydroxide-simethicone (MAALOX) 035-597-41 MG/5ML SUSP Take by mouth.  Marland Kitchen amLODipine (NORVASC) 5 MG  tablet Take 1 tablet (5 mg total) by mouth daily.  Marland Kitchen atorvastatin (LIPITOR) 20 MG tablet TAKE ONE TABLET AT BEDTIME  . benazepril (LOTENSIN) 40 MG tablet TAKE ONE (1) TABLET EACH DAY  . Calcium-Magnesium-Vitamin D 600-40-500 MG-MG-UNIT TB24 Take 630 mg by mouth 4 (four) times daily.  [name] Sodium 30-100 MG CAPS Take by mouth.  . Cholecalciferol (VITAMIN D3) 1000 UNITS CAPS Take by mouth.  . DULoxetine (CYMBALTA) 30 MG capsule   . fluticasone (FLONASE) 50 MCG/ACT nasal spray Place 2 sprays into the nose daily.  . furosemide (LASIX) 20 MG tablet TAKE ONE (1) TABLET EACH DAY AS NEEDED  . gabapentin (NEURONTIN) 300 MG capsule Take 300 mg by mouth 3 (three) times daily.  . hydrochlorothiazide (HYDRODIURIL) 25 MG tablet TAKE ONE (1) TABLET EACH DAY  . HYDROcodone-acetaminophen (NORCO) 10-325 MG per tablet Take 1 tablet by mouth every 4 (four) hours as needed.  . hydrOXYzine (ATARAX/VISTARIL) 25 MG tablet Take 25 mg by mouth at  bedtime.  . Inulin (FIBER CHOICE PO) Take 2 tablets by mouth daily. Chew tablets  . magnesium oxide (MAG-OX) 400 MG tablet Take 1 tablet (400 mg total) by mouth daily.  . metoprolol succinate (TOPROL-XL) 100 MG 24 hr tablet TAKE ONE TABLET TWICE DAILY. TAKE WITH OR IMMEDIATELY FOLLOWING A MEAL  . morphine (MS CONTIN) 60 MG 12 hr tablet Take 60 mg by mouth every 8 (eight) hours.  . Multiple Vitamins-Minerals (CENTRUM SILVER ADULT 50+ PO) Take by mouth daily.  Marland Kitchen nystatin cream (MYCOSTATIN) Apply topically 2 (two) times daily.  . ondansetron (ZOFRAN-ODT) 4 MG disintegrating tablet Take 4 mg by mouth every 8 (eight) hours as needed for nausea or vomiting.  . pantoprazole (PROTONIX) 40 MG tablet Take 40 mg by mouth 2 (two) times daily.  . polyethylene glycol powder (GLYCOLAX/MIRALAX) powder MIX 17 GRAMS (AS MARKED IN BOTTLE TOP) IN 8 OUNCES OF WATER, MIX AND DRINK ONCEA DAY AS DIRECTED.  . Probiotic Product (ALIGN PO) Take by mouth daily.  . Simethicone (GAS-X PO) Take by mouth.  . sodium chloride (OCEAN) 0.65 % nasal spray Place 1 spray into the nose as needed.  Marland Kitchen SYNTHROID 112 MCG tablet TAKE ONE (1) TABLET EACH DAY  . [DISCONTINUED] zolpidem (AMBIEN) 10 MG tablet Take 10 mg by mouth at bedtime as needed for sleep.   No facility-administered encounter medications on file as of 05/11/2015.    Review of Systems  Constitutional: Negative for appetite change and unexpected weight change.  HENT: Negative for congestion and sinus pressure.   Respiratory: Negative for cough, chest tightness and shortness of breath.   Cardiovascular: Negative for chest pain, palpitations and leg swelling.  Gastrointestinal: Negative for nausea, vomiting, abdominal pain and diarrhea.  Genitourinary: Negative for dysuria and difficulty urinating.  Musculoskeletal: Positive for back pain.       Groin pain as outlined.  Increased pain.   Skin: Negative for color change and rash.  Neurological: Negative for dizziness,  light-headedness and headaches.  Psychiatric/Behavioral: Negative for dysphoric mood and agitation.       Objective:    Physical Exam  Constitutional: She appears well-developed and well-nourished. No distress.  HENT:  Nose: Nose normal.  Mouth/Throat: Oropharynx is clear and moist.  Neck: Neck supple. No thyromegaly present.  Cardiovascular: Normal rate and regular rhythm.   Pulmonary/Chest: Effort normal and breath sounds normal. She has no wheezes.  Abdominal: Soft. Bowel sounds are normal. There is no tenderness.  Musculoskeletal: She exhibits no edema  or tenderness.  Increased pain to palpation over the groin - left hip - to palpation.   Lymphadenopathy:    She has no cervical adenopathy.  Skin: No rash noted. No erythema.  Psychiatric: She has a normal mood and affect. Her behavior is normal.    BP 177/68 mmHg  Pulse 61  Temp(Src) 98 F (36.7 C) (Oral)  Ht _0  (1.651 m)  Wt 212 lb 2 oz (96.219 kg)  BMI 35.30 kg/m2  SpO2 97% Wt Readings from Last 3 Encounters:  05/11/15 212 lb 2 oz (96.219 kg)  03/08/15 215 lb 6.4 oz (97.705 kg)  03/02/15 216 lb (97.977 kg)     Lab Results  Component Value Date   WBC 8.9 02/10/2015   HGB 10.8* 02/10/2015   HCT 32.7* 02/10/2015   PLT 172.0 02/10/2015   GLUCOSE 90 02/10/2015   CHOL 136 10/19/2014   TRIG 136.0 10/19/2014   HDL 36.50* 10/19/2014   LDLCALC 72 10/19/2014   ALT 13 10/19/2014   AST 19 10/19/2014   NA 137 02/10/2015   K 4.0 02/10/2015   CL 100 02/10/2015   CREATININE 1.01 02/10/2015   BUN 22 02/10/2015   CO2 30 02/10/2015   TSH 1.10 02/10/2015   INR 1.0 12/08/2013   HGBA1C 6.0 10/19/2014       Assessment & Plan:   Problem List Items Addressed This Visit    Back pain    Increased pain as outlined.  Wants to see Dr Carloyn Manner.  See note.  Needs CT scan of back.  Schedule.        Groin pain    Increased pain to palpation.  Pain appears to be localized over the bone.  No pain over her lower abdomen to  palpation.  Refer to ortho for evaluation.        Hypertension    Blood pressure has been well controlled.  Continue same medication regimen.  We have been adjusted.  Follow pressures.  Follow metabolic panel.        Muscle twitching    Seeing Dr Manuella Ghazi.  Work up in progress.  Planning for EEG.       Neck pain    Just had CT of neck as outlined.  Seeing Dr Farrell Ours.  Has pain medication.  Has f/u in 10/16.         Other Visit Diagnoses    Left groin pain    -  Primary    Relevant Orders    Ambulatory referral to Orthopedic Surgery    Screening breast examination        Relevant Orders    MM DIGITAL SCREENING BILATERAL    Left hip pain        Relevant Orders    Ambulatory referral to Orthopedic Surgery        Einar Pheasant, MD

## 2015-05-11 NOTE — Progress Notes (Signed)
Pre-visit discussion using our clinic review tool. No additional management support is needed unless otherwise documented below in the visit note.

## 2015-05-14 ENCOUNTER — Encounter: Payer: Self-pay | Admitting: Internal Medicine

## 2015-05-14 DIAGNOSIS — R1030 Lower abdominal pain, unspecified: Secondary | ICD-10-CM | POA: Insufficient documentation

## 2015-05-14 NOTE — Assessment & Plan Note (Signed)
Blood pressure has been well controlled.  Continue same medication regimen.  We have been adjusted.  Follow pressures.  Follow metabolic panel.

## 2015-05-14 NOTE — Assessment & Plan Note (Signed)
Increased pain as outlined.  Wants to see Dr Carloyn Manner.  See note.  Needs CT scan of back.  Schedule.

## 2015-05-14 NOTE — Assessment & Plan Note (Signed)
Increased pain to palpation.  Pain appears to be localized over the bone.  No pain over her lower abdomen to palpation.  Refer to ortho for evaluation.

## 2015-05-14 NOTE — Assessment & Plan Note (Signed)
Seeing Dr Manuella Ghazi.  Work up in progress.  Planning for EEG.

## 2015-05-14 NOTE — Assessment & Plan Note (Signed)
Just had CT of neck as outlined.  Seeing Dr Farrell Ours.  Has pain medication.  Has f/u in 10/16.

## 2015-05-15 ENCOUNTER — Ambulatory Visit
Admission: RE | Admit: 2015-05-15 | Discharge: 2015-05-15 | Disposition: A | Discharge status: Home / Self Care | Payer: PPO | Source: Ambulatory Visit | Attending: Neurology | Admitting: Neurology

## 2015-05-15 ENCOUNTER — Encounter: Payer: Self-pay | Admitting: Internal Medicine

## 2015-05-15 DIAGNOSIS — R2689 Other abnormalities of gait and mobility: Secondary | ICD-10-CM | POA: Diagnosis present

## 2015-05-15 DIAGNOSIS — M6281 Muscle weakness (generalized): Secondary | ICD-10-CM | POA: Insufficient documentation

## 2015-05-17 ENCOUNTER — Other Ambulatory Visit: Payer: PPO

## 2015-05-18 ENCOUNTER — Encounter: Payer: Self-pay | Admitting: Internal Medicine

## 2015-05-18 ENCOUNTER — Other Ambulatory Visit: Payer: PPO

## 2015-05-19 ENCOUNTER — Other Ambulatory Visit: Payer: Self-pay | Admitting: Internal Medicine

## 2015-05-19 DIAGNOSIS — M51369 Other intervertebral disc degeneration, lumbar region without mention of lumbar back pain or lower extremity pain: Secondary | ICD-10-CM

## 2015-05-19 DIAGNOSIS — M5489 Other dorsalgia: Secondary | ICD-10-CM

## 2015-05-19 DIAGNOSIS — M5134 Other intervertebral disc degeneration, thoracic region: Secondary | ICD-10-CM

## 2015-05-19 DIAGNOSIS — M5136 Other intervertebral disc degeneration, lumbar region: Secondary | ICD-10-CM

## 2015-05-19 NOTE — Telephone Encounter (Signed)
See if pt can come in Tuesday 05/23/15 at 10:30.  Block 30 min

## 2015-05-19 NOTE — Progress Notes (Signed)
Order placed for CT scan.

## 2015-05-23 ENCOUNTER — Ambulatory Visit (INDEPENDENT_AMBULATORY_CARE_PROVIDER_SITE_OTHER): Payer: PPO | Admitting: Internal Medicine

## 2015-05-23 ENCOUNTER — Encounter: Payer: Self-pay | Admitting: Internal Medicine

## 2015-05-23 ENCOUNTER — Other Ambulatory Visit: Payer: PPO

## 2015-05-23 VITALS — BP 152/80 | HR 67 | Temp 98.1°F | Ht 65.0 in | Wt 208.0 lb

## 2015-05-23 DIAGNOSIS — I10 Essential (primary) hypertension: Secondary | ICD-10-CM | POA: Diagnosis not present

## 2015-05-23 DIAGNOSIS — M5440 Lumbago with sciatica, unspecified side: Secondary | ICD-10-CM

## 2015-05-23 DIAGNOSIS — E7800 Pure hypercholesterolemia, unspecified: Secondary | ICD-10-CM

## 2015-05-23 DIAGNOSIS — D649 Anemia, unspecified: Secondary | ICD-10-CM | POA: Diagnosis not present

## 2015-05-23 LAB — HEPATIC FUNCTION PANEL
ALBUMIN: 4.1 g/dL (ref 3.5–5.2)
ALT: 34 U/L (ref 0–35)
AST: 18 U/L (ref 0–37)
Alkaline Phosphatase: 64 U/L (ref 39–117)
Bilirubin, Direct: 0.1 mg/dL (ref 0.0–0.3)
TOTAL PROTEIN: 7.2 g/dL (ref 6.0–8.3)
Total Bilirubin: 0.6 mg/dL (ref 0.2–1.2)

## 2015-05-23 LAB — BASIC METABOLIC PANEL
BUN: 27 mg/dL — ABNORMAL HIGH (ref 6–23)
CHLORIDE: 95 meq/L — AB (ref 96–112)
CO2: 36 mEq/L — ABNORMAL HIGH (ref 19–32)
Calcium: 10.2 mg/dL (ref 8.4–10.5)
Creatinine, Ser: 0.94 mg/dL (ref 0.40–1.20)
GFR: 60.56 mL/min (ref >60.00)
Glucose, Bld: 94 mg/dL (ref 70–99)
POTASSIUM: 5.2 meq/L — AB (ref 3.5–5.1)
SODIUM: 139 meq/L (ref 135–145)

## 2015-05-23 LAB — CBC WITH DIFFERENTIAL/PLATELET
Basophils Absolute: 0 10*3/uL (ref 0.0–0.1)
Basophils Relative: 0.2 % (ref 0.0–3.0)
Eosinophils Absolute: 0.3 10*3/uL (ref 0.0–0.7)
Eosinophils Relative: 2 % (ref 0.0–5.0)
HEMATOCRIT: 38.9 % (ref 36.0–46.0)
Hemoglobin: 12.7 g/dL (ref 12.0–15.0)
LYMPHS PCT: 36.6 % (ref 12.0–46.0)
Lymphs Abs: 4.8 10*3/uL — ABNORMAL HIGH (ref 0.7–4.0)
MCHC: 32.6 g/dL (ref 30.0–36.0)
MCV: 97.8 fl (ref 78.0–100.0)
MONOS PCT: 7.9 % (ref 3.0–12.0)
Monocytes Absolute: 1 10*3/uL (ref 0.1–1.0)
NEUTROS ABS: 7 10*3/uL (ref 1.4–7.7)
Neutrophils Relative %: 53.3 % (ref 43.0–77.0)
Platelets: 238 10*3/uL (ref 150.0–400.0)
RBC: 3.98 Mil/uL (ref 3.87–5.11)
RDW: 15.2 % (ref 11.5–15.5)
WBC: 13.2 10*3/uL — ABNORMAL HIGH (ref 4.0–10.5)

## 2015-05-23 LAB — LIPID PANEL
CHOL/HDL RATIO: 3
Cholesterol: 144 mg/dL (ref 0–200)
HDL: 46.8 mg/dL (ref >39.00)
LDL CALC: 63 mg/dL (ref 0–99)
NonHDL: 97.53
TRIGLYCERIDES: 171 mg/dL — AB (ref 0.0–149.0)
VLDL: 34.2 mg/dL (ref 0.0–40.0)

## 2015-05-23 LAB — FERRITIN: Ferritin: 112.3 ng/mL (ref 10.0–291.0)

## 2015-05-23 NOTE — Progress Notes (Signed)
Pre-visit discussion using our clinic review tool. No additional management support is needed unless otherwise documented below in the visit note.  

## 2015-05-25 ENCOUNTER — Ambulatory Visit
Admission: RE | Admit: 2015-05-25 | Discharge: 2015-05-25 | Disposition: A | Discharge status: Home / Self Care | Payer: PPO | Source: Ambulatory Visit | Attending: Internal Medicine | Admitting: Internal Medicine

## 2015-05-25 DIAGNOSIS — M5136 Other intervertebral disc degeneration, lumbar region: Secondary | ICD-10-CM

## 2015-05-25 DIAGNOSIS — I2510 Atherosclerotic heart disease of native coronary artery without angina pectoris: Secondary | ICD-10-CM | POA: Diagnosis not present

## 2015-05-25 DIAGNOSIS — M545 Low back pain: Secondary | ICD-10-CM | POA: Diagnosis present

## 2015-05-25 DIAGNOSIS — M5134 Other intervertebral disc degeneration, thoracic region: Secondary | ICD-10-CM

## 2015-05-25 DIAGNOSIS — M47816 Spondylosis without myelopathy or radiculopathy, lumbar region: Secondary | ICD-10-CM | POA: Diagnosis not present

## 2015-05-25 DIAGNOSIS — M5489 Other dorsalgia: Secondary | ICD-10-CM

## 2015-05-25 DIAGNOSIS — Z9889 Other specified postprocedural states: Secondary | ICD-10-CM | POA: Diagnosis not present

## 2015-05-25 DIAGNOSIS — M4806 Spinal stenosis, lumbar region: Secondary | ICD-10-CM | POA: Insufficient documentation

## 2015-05-26 ENCOUNTER — Encounter: Payer: Self-pay | Admitting: Internal Medicine

## 2015-05-28 ENCOUNTER — Encounter: Payer: Self-pay | Admitting: Internal Medicine

## 2015-05-28 NOTE — Progress Notes (Signed)
Patient ID: Kimberly Roy, female   DOB: August 30, 1931, 81 y.o.   MRN: [id number]   Subjective:    Patient ID: Kimberly Roy, female    DOB: Jan 01, 1931, 81 y.o.   MRN: [id number]  HPI  Patient here today as a workin with concerns regarding back pain and pain extending down her mid posterior thigh to her knee and also to her groin.  Also has had pain extending down to her left great toe.  No numbness or tingling.  Increased pain several days ago.  Is some better now.  Is planning to have CT scan in two days in preparation for appt with Dr Carloyn Manner.  No urine change.  Is concerned that some of this pain may be from her bladder.  Plans to discuss with her urologist.     Past Medical History  Diagnosis Date  . Osteoarthritis     knees/cervical and lumbar spine  . Hypertension   . Hyperlipidemia   . Hypothyroidism   . Anemia   . Depression   . Anxiety   . Nephrolithiasis   . Interstitial cystitis   . Pulmonary nodules     followed by Dr Raul Del  . Fatty liver   . Renal cyst     right  . Pulmonary hypertension   . Headache   . DVT (deep venous thrombosis)   . Fatty liver   . DDD (degenerative disc disease), cervical    Past Surgical History  Procedure Laterality Date  . Breast reduction surgery      3/99  . Back surgeries    . Appendectomy    . Cervical spine surgery    . Tonsilectomy/adenoidectomy with myringotomy    . Excisional hemorrhoidectomy    . Rotator cuff surgery      blilateral  . Knee arthroscopy      left and right  . Abdominal hysterectomy      ovaries left in place  . Cataracts Bilateral   . Hemorrhoid surgery    . Esophagogastroduodenoscopy N/A 02/27/2015    Procedure: ESOPHAGOGASTRODUODENOSCOPY (EGD);  Surgeon: Hulen Luster, MD;  Location: [facility] ENDOSCOPY;  Service: Gastroenterology;  Laterality: N/A;  . Hip surgery  2013    Right hip surgery   Family History  Problem Relation Age of Onset  . Heart disease Mother   . Stroke Mother   . Heart disease Father    myocardial infarction age 31  . Hypertension Mother    Social History   Social History  . Marital Status: Married    Spouse Name: N/A  . Number of Children: 2  . Years of Education: N/A   Social History Main Topics  . Smoking status: Never Smoker   . Smokeless tobacco: Never Used  . Alcohol Use: No  . Drug Use: No  . Sexual Activity: Not Currently   Other Topics Concern  . None   Social History Narrative    Outpatient Encounter Prescriptions as of 05/23/2015  Medication Sig  . aluminum-magnesium hydroxide-simethicone (MAALOX) 532-992-42 MG/5ML SUSP Take by mouth.  Marland Kitchen amLODipine (NORVASC) 5 MG tablet Take 1 tablet (5 mg total) by mouth daily.  Marland Kitchen atorvastatin (LIPITOR) 20 MG tablet TAKE ONE TABLET AT BEDTIME  . benazepril (LOTENSIN) 40 MG tablet TAKE ONE (1) TABLET EACH DAY  . Calcium-Magnesium-Vitamin D 600-40-500 MG-MG-UNIT TB24 Take 630 mg by mouth 4 (four) times daily.  [name] Sodium 30-100 MG CAPS Take by mouth.  . Cholecalciferol (VITAMIN D3) 1000 UNITS CAPS Take  by mouth.  . DULoxetine (CYMBALTA) 30 MG capsule   . fluticasone (FLONASE) 50 MCG/ACT nasal spray Place 2 sprays into the nose daily.  . furosemide (LASIX) 20 MG tablet TAKE ONE (1) TABLET EACH DAY AS NEEDED  . gabapentin (NEURONTIN) 300 MG capsule Take 300 mg by mouth 3 (three) times daily.  . hydrochlorothiazide (HYDRODIURIL) 25 MG tablet TAKE ONE (1) TABLET EACH DAY  . HYDROcodone-acetaminophen (NORCO) 10-325 MG per tablet Take 1 tablet by mouth every 4 (four) hours as needed.  . hydrOXYzine (ATARAX/VISTARIL) 25 MG tablet Take 25 mg by mouth at bedtime.  . Inulin (FIBER CHOICE PO) Take 2 tablets by mouth daily. Chew tablets  . magnesium oxide (MAG-OX) 400 MG tablet Take 1 tablet (400 mg total) by mouth daily.  . metoprolol succinate (TOPROL-XL) 100 MG 24 hr tablet TAKE ONE TABLET TWICE DAILY. TAKE WITH OR IMMEDIATELY FOLLOWING A MEAL  . morphine (MS CONTIN) 60 MG 12 hr tablet Take 60 mg by  mouth every 8 (eight) hours.  . Multiple Vitamins-Minerals (CENTRUM SILVER ADULT 50+ PO) Take by mouth daily.  Marland Kitchen nystatin cream (MYCOSTATIN) Apply topically 2 (two) times daily.  . ondansetron (ZOFRAN-ODT) 4 MG disintegrating tablet Take 4 mg by mouth every 8 (eight) hours as needed for nausea or vomiting.  . pantoprazole (PROTONIX) 40 MG tablet Take 40 mg by mouth 2 (two) times daily.  . polyethylene glycol powder (GLYCOLAX/MIRALAX) powder MIX 17 GRAMS (AS MARKED IN BOTTLE TOP) IN 8 OUNCES OF WATER, MIX AND DRINK ONCEA DAY AS DIRECTED.  . Probiotic Product (ALIGN PO) Take by mouth daily.  . Simethicone (GAS-X PO) Take by mouth.  . sodium chloride (OCEAN) 0.65 % nasal spray Place 1 spray into the nose as needed.  Marland Kitchen SYNTHROID 112 MCG tablet TAKE ONE (1) TABLET EACH DAY   No facility-administered encounter medications on file as of 05/23/2015.    Review of Systems  Constitutional: Negative for appetite change and unexpected weight change.  HENT: Negative for congestion and sinus pressure.   Respiratory: Negative for cough, chest tightness and shortness of breath.   Cardiovascular: Negative for chest pain, palpitations and leg swelling.  Gastrointestinal: Negative for nausea, vomiting, abdominal pain and diarrhea.  Genitourinary: Negative for dysuria and difficulty urinating.  Musculoskeletal: Positive for back pain.       Leg pain as outlined.    Skin: Negative for color change and rash.  Neurological: Negative for dizziness, light-headedness and headaches.       Objective:     Blood pressure rechecked by me:  138-140/60  Physical Exam  Constitutional: She appears well-developed and well-nourished. No distress.  Neck: Neck supple. No thyromegaly present.  Cardiovascular: Normal rate and regular rhythm.   Pulmonary/Chest: Breath sounds normal. No respiratory distress. She has no wheezes.  Abdominal: Soft. Bowel sounds are normal. There is no tenderness.  Musculoskeletal: She  exhibits no edema or tenderness.  No increased pain in her groin with abduction of her leg.  Some increased pain with straight leg raise.  Motor strength appears to be equal bilaterally.  Plantar and dorsiflexion intact.    Lymphadenopathy:    She has no cervical adenopathy.  Skin: No rash noted. No erythema.    BP 152/80 mmHg  Pulse 67  Temp(Src) 98.1 F (36.7 C) (Oral)  Ht _0  (1.651 m)  Wt 208 lb (94.348 kg)  BMI 34.61 kg/m2  SpO2 98% Wt Readings from Last 3 Encounters:  05/23/15 208 lb (94.348 kg)  05/11/15  212 lb 2 oz (96.219 kg)  03/08/15 215 lb 6.4 oz (97.705 kg)     Lab Results  Component Value Date   WBC 13.2* 05/23/2015   HGB 12.7 05/23/2015   HCT 38.9 05/23/2015   PLT 238.0 05/23/2015   GLUCOSE 94 05/23/2015   CHOL 144 05/23/2015   TRIG 171.0* 05/23/2015   HDL 46.80 05/23/2015   LDLCALC 63 05/23/2015   ALT 34 05/23/2015   AST 18 05/23/2015   NA 139 05/23/2015   K 5.2* 05/23/2015   CL 95* 05/23/2015   CREATININE 0.94 05/23/2015   BUN 27* 05/23/2015   CO2 36* 05/23/2015   TSH 1.10 02/10/2015   INR 1.0 12/08/2013   HGBA1C 6.0 10/19/2014    Ct Thoracic Spine Wo Contrast  05/25/2015   CLINICAL DATA:  81 year old female with chronic left thoracic and lumbar pain (greater in the thoracic region) for the past 30 years. Difficulty walking. Multiple spine surgeries. Stimulator in place. Hypertension. Subsequent encounter.  EXAM: CT THORACIC SPINE WITHOUT CONTRAST  TECHNIQUE: Multidetector CT imaging of the thoracic spine was performed without intravenous contrast administration. Multiplanar CT image reconstructions were also generated.  COMPARISON:  06/17/2013 thoracic spine CT. CT lumbar spine performed same date is dictated separately.  FINDINGS: Utilizing right-sided ribs for proper level assignment, the present examination incorporates from upper C7 to upper L2 level. Supplemental imaging was performed from the upper C5 through the lower T2 level.  A spine  stimulating device enters at the T8-9 level with the tip of the stimulating device extending from the lower T6 to the upper T8 level.  No acute or focal thoracic compression fracture.  Mild dextroscoliosis thoracic spine.  C5-6: Broad-based disc osteophyte complex greater to the left. Mild to moderate right-sided and moderate left-sided spinal stenosis. Mild right-sided and marked left-sided foraminal narrowing.  C6-7: Disc osteophyte complex greater to left. Mild spinal stenosis greater on the left. Mild right-sided and mild-to-moderate left-sided foraminal narrowing.  C7-T1:  Minimal bulge.  T1-2: Spur with mild bilateral foraminal narrowing. Focal bony projection projects from the anterior aspect of the spinous process this 11 articulation distal left midline with slight impression upon the dorsal thecal sac without cord compression.  T2-3: Disc osteophyte complex with minimal narrowing ventral aspect of thecal sac. Moderate right foraminal narrowing.  T3-4:  Minimal foraminal narrowing.  T4-5:  Mild right-sided and moderate left-sided foraminal narrowing.  T5-6: Bony overgrowth central posterior elements with slight impression upon the dorsal thecal sac without cord compression.  T6-7: Stimulating device just left of midline dorsal aspect the canal which streak artifact.  T7-8: Stimulating device just left of midline dorsal aspect canal or streak artifact.  T8-9: Shallow disc osteophyte complex. Mild narrowing ventral aspect of thecal sac. Mild bilateral foraminal narrowing.  T9-10: Ossification posterior ligaments/dorsal bony overgrowth. Mild narrowing dorsal lateral aspect of the canal without significant cord compression. Shallow disc osteophyte complex. Mild moderate right-sided and mild left-sided foraminal narrowing.  T10-11: Broad-based disc osteophyte complex greater to the right. Posterior element hypertrophy. Mild spinal stenosis greater on the right. Mild bilateral foraminal narrowing.  T11-12:  Rotation. Disc osteophyte complex greater right paracentral position. Mild facet joint degenerative changes. Ligamentum flavum hypertrophy. Mild spinal stenosis greater on the right. Moderate to marked right-sided and mild left-sided foraminal narrowing.  T12-L1: Fusion posterior elements. Disc osteophyte complex greater to left. Mild posterior element hypertrophy. Mild spinal stenosis greater on left.  Atherosclerotic type changes thoracic aorta, upper abdominal aorta, left renal artery, great vessels  and right carotid bifurcation. Mild ectasia ascending thoracic aorta measuring up to 3.6 cm.  Asymmetric thyroid gland larger on the right with streak artifact through this region without obvious mass identified.  No worrisome lung mass identified on limited imaging of the lungs.  IMPRESSION: Minimal change from prior exam with scattered multi-level degenerative changes as detailed above.  A spine stimulating device enters at the T8-9 level with the tip of the stimulating device extending from the lower T6 to the upper T8 level.   Electronically Signed   By: Genia Del M.D.   On: 05/25/2015 15:36   Ct Lumbar Spine Wo Contrast  05/25/2015   CLINICAL DATA:  Chronic low and mid back pain. History of multiple spine surgeries and neural stimulator placement.  EXAM: CT LUMBAR SPINE WITHOUT CONTRAST  TECHNIQUE: Multidetector CT imaging of the lumbar spine was performed without intravenous contrast administration. Multiplanar CT image reconstructions were also generated.  COMPARISON:  Abdomen and pelvis CT dated 10/28/2014.  FINDINGS: Five non-rib-bearing lumbar vertebrae and partially lumbarized S1 vertebra. Posterior laminectomy defects and solid bone fusion at the L1 through S2 levels. Bone harvesting defects in both iliac bones.  T12-L1: Mild diffuse posterior disc bulging and spur formation and mild bilateral facet hypertrophy with bilateral ligamentum flavum hypertrophy. These changes are producing mild to  moderate canal stenosis and moderate foraminal stenosis on the right without significant foraminal stenosis on the left.  L1-2: Mild to moderate diffuse posterior disc bulging and associated spur formation. Facet spur formation on the right. Bilateral facet hypertrophy. These changes are producing mild moderate canal stenosis, mild-to-moderate foraminal stenosis on the left and mild foraminal stenosis on the right.  L2-3: Minimal diffuse disc bulging without significant canal or foraminal stenosis.  L3-4: Minimal diffuse disc bulging without significant canal or foraminal stenosis.  L4-5: Mild bilateral ligamentum flavum hypertrophy with minimal canal stenosis and no significant foraminal stenosis.  L5-S1: Moderate bilateral ligamentum flavum hypertrophy with mild canal stenosis and mild bilateral foraminal stenosis.  No focal disc protrusions are seen and no nerve root compression seen at any level. Mild atheromatous arterial calcifications are noted.  IMPRESSION: Postoperative and degenerative changes, as described above. No evidence of nerve root compression at any level.   Electronically Signed   By: Claudie Revering M.D.   On: 05/25/2015 15:15       Assessment & Plan:   Problem List Items Addressed This Visit    Anemia    Has seen GI.  Follow hgb.       Back pain - Primary    Increased pain.  Pain down leg as outlined.  Planning for CT scan this week.  Wants to see Dr Carloyn Manner.  Have placed order for referral.        Hypercholesterolemia    Low cholesterol diet and exercise.  On lipitor.  Follow lipid panel and liver function tests.        Hypertension    Blood pressure on recheck by me improved.  Same medication regimen.  Follow pressures.  Follow metabolic panel.          I spent 25 minutes with the patient and more than 50% of the time was spent in consultation regarding the above.     Einar Pheasant, MD

## 2015-05-28 NOTE — Assessment & Plan Note (Signed)
Has seen GI.  Follow hgb.

## 2015-05-28 NOTE — Assessment & Plan Note (Signed)
Low cholesterol diet and exercise.  On lipitor.  Follow lipid panel and liver function tests.   

## 2015-05-28 NOTE — Assessment & Plan Note (Signed)
Blood pressure on recheck by me improved.  Same medication regimen.  Follow pressures.  Follow metabolic panel.

## 2015-05-28 NOTE — Assessment & Plan Note (Signed)
Increased pain.  Pain down leg as outlined.  Planning for CT scan this week.  Wants to see Dr Carloyn Manner.  Have placed order for referral.

## 2015-05-29 ENCOUNTER — Telehealth: Payer: Self-pay | Admitting: *Deleted

## 2015-05-29 ENCOUNTER — Other Ambulatory Visit (INDEPENDENT_AMBULATORY_CARE_PROVIDER_SITE_OTHER): Payer: PPO

## 2015-05-29 DIAGNOSIS — E875 Hyperkalemia: Secondary | ICD-10-CM

## 2015-05-29 DIAGNOSIS — D72829 Elevated white blood cell count, unspecified: Secondary | ICD-10-CM

## 2015-05-29 NOTE — Telephone Encounter (Signed)
Order placed for labs.

## 2015-05-29 NOTE — Telephone Encounter (Signed)
Labs and dx?

## 2015-05-30 LAB — CBC WITH DIFFERENTIAL/PLATELET
BASOS ABS: 0 10*3/uL (ref 0.0–0.1)
BASOS PCT: 0.4 % (ref 0.0–3.0)
EOS PCT: 3 % (ref 0.0–5.0)
Eosinophils Absolute: 0.2 10*3/uL (ref 0.0–0.7)
HEMATOCRIT: 34.4 % — AB (ref 36.0–46.0)
Hemoglobin: 11.3 g/dL — ABNORMAL LOW (ref 12.0–15.0)
LYMPHS ABS: 2 10*3/uL (ref 0.7–4.0)
LYMPHS PCT: 25.1 % (ref 12.0–46.0)
MCHC: 33 g/dL (ref 30.0–36.0)
MCV: 97.8 fl (ref 78.0–100.0)
MONOS PCT: 6.4 % (ref 3.0–12.0)
Monocytes Absolute: 0.5 10*3/uL (ref 0.1–1.0)
NEUTROS ABS: 5.1 10*3/uL (ref 1.4–7.7)
NEUTROS PCT: 65.1 % (ref 43.0–77.0)
PLATELETS: 175 10*3/uL (ref 150.0–400.0)
RBC: 3.51 Mil/uL — ABNORMAL LOW (ref 3.87–5.11)
RDW: 14.8 % (ref 11.5–15.5)
WBC: 7.9 10*3/uL (ref 4.0–10.5)

## 2015-05-30 LAB — BASIC METABOLIC PANEL
BUN: 19 mg/dL (ref 6–23)
CALCIUM: 9.5 mg/dL (ref 8.4–10.5)
CHLORIDE: 97 meq/L (ref 96–112)
CO2: 35 meq/L — AB (ref 19–32)
CREATININE: 0.88 mg/dL (ref 0.40–1.20)
GFR: 65.35 mL/min (ref >60.00)
Glucose, Bld: 113 mg/dL — ABNORMAL HIGH (ref 70–99)
Potassium: 4.7 mEq/L (ref 3.5–5.1)
Sodium: 139 mEq/L (ref 135–145)

## 2015-05-31 ENCOUNTER — Encounter: Payer: Self-pay | Admitting: Internal Medicine

## 2015-06-05 ENCOUNTER — Other Ambulatory Visit: Payer: Self-pay | Admitting: Internal Medicine

## 2015-06-09 ENCOUNTER — Telehealth: Payer: Self-pay | Admitting: *Deleted

## 2015-06-09 ENCOUNTER — Encounter: Payer: Self-pay | Admitting: Internal Medicine

## 2015-06-09 NOTE — Telephone Encounter (Signed)
-----  Message from Einar Pheasant, MD sent at 06/09/2015  2:01 PM EDT ----- Regarding: neurology information Pt sent my chart message.  Was questioning me on results of EEG by Dr Manuella Ghazi.  I am unable to pull these up.  Please call [facility] neurology and see if they can send me a copy of his interpretation of EEG and his recommendation.  Thanks    Dr Nicki Reaper

## 2015-06-09 NOTE — Telephone Encounter (Signed)
Records requested via fax

## 2015-06-12 ENCOUNTER — Encounter: Payer: Self-pay | Admitting: Internal Medicine

## 2015-06-13 ENCOUNTER — Encounter: Payer: Self-pay | Admitting: Internal Medicine

## 2015-06-16 ENCOUNTER — Telehealth: Payer: Self-pay | Admitting: Internal Medicine

## 2015-06-16 NOTE — Telephone Encounter (Signed)
Note sent to pt via my chart - regarding EEG.

## 2015-06-17 ENCOUNTER — Encounter: Payer: Self-pay | Admitting: Internal Medicine

## 2015-06-20 ENCOUNTER — Other Ambulatory Visit: Payer: Self-pay | Admitting: Orthopedic Surgery

## 2015-06-20 DIAGNOSIS — M25552 Pain in left hip: Principal | ICD-10-CM

## 2015-06-20 DIAGNOSIS — G8929 Other chronic pain: Secondary | ICD-10-CM

## 2015-06-23 ENCOUNTER — Ambulatory Visit
Admission: RE | Admit: 2015-06-23 | Discharge: 2015-06-23 | Disposition: A | Discharge status: Home / Self Care | Payer: PPO | Source: Ambulatory Visit | Attending: Orthopedic Surgery | Admitting: Orthopedic Surgery

## 2015-06-23 DIAGNOSIS — M25552 Pain in left hip: Secondary | ICD-10-CM | POA: Insufficient documentation

## 2015-06-23 DIAGNOSIS — G8929 Other chronic pain: Secondary | ICD-10-CM

## 2015-07-05 ENCOUNTER — Other Ambulatory Visit: Payer: Self-pay | Admitting: Internal Medicine

## 2015-07-12 ENCOUNTER — Emergency Department: Payer: PPO

## 2015-07-12 ENCOUNTER — Emergency Department
Admission: EM | Admit: 2015-07-12 | Discharge: 2015-07-12 | Disposition: A | Discharge status: Home / Self Care | Payer: PPO | Attending: Emergency Medicine | Admitting: Emergency Medicine

## 2015-07-12 ENCOUNTER — Encounter: Payer: Self-pay | Admitting: Internal Medicine

## 2015-07-12 DIAGNOSIS — L03115 Cellulitis of right lower limb: Secondary | ICD-10-CM | POA: Diagnosis not present

## 2015-07-12 DIAGNOSIS — I10 Essential (primary) hypertension: Secondary | ICD-10-CM | POA: Insufficient documentation

## 2015-07-12 DIAGNOSIS — Z79891 Long term (current) use of opiate analgesic: Secondary | ICD-10-CM | POA: Diagnosis not present

## 2015-07-12 DIAGNOSIS — Z7951 Long term (current) use of inhaled steroids: Secondary | ICD-10-CM | POA: Diagnosis not present

## 2015-07-12 DIAGNOSIS — Z79899 Other long term (current) drug therapy: Secondary | ICD-10-CM | POA: Insufficient documentation

## 2015-07-12 DIAGNOSIS — R2243 Localized swelling, mass and lump, lower limb, bilateral: Secondary | ICD-10-CM | POA: Diagnosis present

## 2015-07-12 LAB — BASIC METABOLIC PANEL
Anion gap: 10 (ref 5–15)
BUN: 14 mg/dL (ref 6–20)
CHLORIDE: 99 mmol/L — AB (ref 101–111)
CO2: 30 mmol/L (ref 22–32)
Calcium: 9.5 mg/dL (ref 8.9–10.3)
Creatinine, Ser: 0.68 mg/dL (ref 0.44–1.00)
GFR calc non Af Amer: >60 mL/min (ref >60)
Glucose, Bld: 87 mg/dL (ref 65–99)
POTASSIUM: 3.6 mmol/L (ref 3.5–5.1)
SODIUM: 139 mmol/L (ref 135–145)

## 2015-07-12 LAB — CBC
HEMATOCRIT: 34.6 % — AB (ref 35.0–47.0)
HEMOGLOBIN: 11.4 g/dL — AB (ref 12.0–16.0)
MCH: 31.8 pg (ref 26.0–34.0)
MCHC: 33 g/dL (ref 32.0–36.0)
MCV: 96.5 fL (ref 80.0–100.0)
Platelets: 198 10*3/uL (ref 150–440)
RBC: 3.58 MIL/uL — AB (ref 3.80–5.20)
RDW: 13.9 % (ref 11.5–14.5)
WBC: 7 10*3/uL (ref 3.6–11.0)

## 2015-07-12 MED ORDER — DOXYCYCLINE HYCLATE 100 MG PO CAPS: 100.0000 mg | ORAL_CAPSULE | Freq: Two times a day (BID) | ORAL | Status: DC

## 2015-07-12 MED ORDER — DOXYCYCLINE HYCLATE 100 MG PO TABS
100.0000 mg | ORAL_TABLET | Freq: Once | ORAL | Status: AC
  Administered 2015-07-12: 100 mg via ORAL
  Filled 2015-07-12: qty 1

## 2015-07-12 NOTE — Discharge Instructions (Signed)
Cellulitis Cellulitis is an infection of the skin and the tissue beneath it. The infected area is usually red and tender. Cellulitis occurs most often in the arms and lower legs.  CAUSES  Cellulitis is caused by bacteria that enter the skin through cracks or cuts in the skin. The most common types of bacteria that cause cellulitis are staphylococci and streptococci. SIGNS AND SYMPTOMS   Redness and warmth.  Swelling.  Tenderness or pain.  Fever. DIAGNOSIS  Your health care provider can usually determine what is wrong based on a physical exam. Blood tests may also be done. TREATMENT  Treatment usually involves taking an antibiotic medicine. HOME CARE INSTRUCTIONS   Take your antibiotic medicine as directed by your health care provider. Finish the antibiotic even if you start to feel better.  Keep the infected arm or leg elevated to reduce swelling.  Apply a warm cloth to the affected area up to 4 times per day to relieve pain.  Take medicines only as directed by your health care provider.  Keep all follow-up visits as directed by your health care provider. SEEK MEDICAL CARE IF:   You notice red streaks coming from the infected area.  Your red area gets larger or turns dark in color.  Your bone or joint underneath the infected area becomes painful after the skin has healed.  Your infection returns in the same area or another area.  You notice a swollen bump in the infected area.  You develop new symptoms.  You have a fever. SEEK IMMEDIATE MEDICAL CARE IF:   You feel very sleepy.  You develop vomiting or diarrhea.  You have a general ill feeling (malaise) with muscle aches and pains.   This information is not intended to replace advice given to you by your health care provider. Make sure you discuss any questions you have with your health care provider.   Document Released: 05/29/2005 Document Revised: 05/10/2015 Document Reviewed: 11/04/2011 Elsevier Interactive  Patient Education [employer].

## 2015-07-12 NOTE — ED Notes (Signed)
Patient comes in with complaints of right leg swelling.  Patient with history of DVT to right leg and bilateral lower leg cellulitis.  Upon assessment patient with 2+ bilateral pedal pulse, right leg red and more swollen than left.

## 2015-07-12 NOTE — ED Notes (Signed)
Patient transported to CT 

## 2015-07-12 NOTE — ED Provider Notes (Signed)
[facility] Emergency Department Provider Note REMINDER - THIS NOTE IS NOT A FINAL MEDICAL RECORD UNTIL IT IS SIGNED. UNTIL THEN, THE CONTENT BELOW MAY REFLECT INFORMATION FROM A DOCUMENTATION TEMPLATE, NOT THE ACTUAL PATIENT VISIT. ____________________________________________  Time seen: Approximately 5:50 PM  I have reviewed the triage vital signs and the nursing notes.   HISTORY  Chief Complaint Leg Swelling    HPI Kimberly Roy is a 81 y.o. female reports a previous history of a blood clot in the right leg as well as cellulitis. She reports that she saw her psychiatrist today and told her about an area of redness that she has in the right lower shin and advised her to come the ER.  Patient reports she's had cellulitis before, and believe she has some cellulitis in this leg. She denies pain in the leg and no fevers, chills, nausea or vomiting. No cold tingling or blue feet. She is able to ambulate on the leg without difficulty. She reports chronic swelling in both legs, unchanged.   Past Medical History  Diagnosis Date  . Osteoarthritis     knees/cervical and lumbar spine  . Hypertension   . Hyperlipidemia   . Hypothyroidism   . Anemia   . Depression   . Anxiety   . Nephrolithiasis   . Interstitial cystitis   . Pulmonary nodules     followed by Dr Raul Del  . Fatty liver   . Renal cyst     right  . Pulmonary hypertension ([place])   . Headache   . DVT (deep venous thrombosis) ([place])   . Fatty liver   . DDD (degenerative disc disease), cervical     Patient Active Problem List   Diagnosis Date Noted  . Groin pain 05/14/2015  . Muscle twitching 03/05/2015  . Leg cramps 02/11/2015  . Back pain 02/11/2015  . Abdominal pain 02/11/2015  . Acute cystitis without hematuria 12/12/2014  . Left elbow pain 11/30/2014  . Health care maintenance 11/30/2014  . Osteoporosis 10/19/2014  . Rectal bleeding 10/19/2014  . Obesity (BMI 30-39.9) 09/03/2014  .  Neck pain 09/03/2014  . Unsteady gait 09/03/2014  . Nocturia 09/03/2014  . Dysphagia 06/01/2014  . Nasal dryness 06/01/2014  . Stress 06/01/2014  . Obesity 02/27/2014  . Fatigue 02/27/2014  . Pre-op evaluation 10/12/2013  . Hoarseness 08/14/2013  . Leg swelling 01/24/2013  . CHF (congestive heart failure) ([place]) 12/29/2012  . Cough 12/29/2012  . Chest pain 12/29/2012  . OSA (obstructive sleep apnea) 07/09/2012  . Osteoarthritis 07/04/2012  . Anemia 07/04/2012  . Chronic constipation 07/04/2012  . Pulmonary hypertension ([place]) 07/04/2012  . Pulmonary nodules 07/04/2012  . Hypertension 07/04/2012  . Hypercholesterolemia 07/04/2012  . Hypothyroidism 07/04/2012    Past Surgical History  Procedure Laterality Date  . Breast reduction surgery      3/99  . Back surgeries    . Appendectomy    . Cervical spine surgery    . Tonsilectomy/adenoidectomy with myringotomy    . Excisional hemorrhoidectomy    . Rotator cuff surgery      blilateral  . Knee arthroscopy      left and right  . Abdominal hysterectomy      ovaries left in place  . Cataracts Bilateral   . Hemorrhoid surgery    . Esophagogastroduodenoscopy N/A 02/27/2015    Procedure: ESOPHAGOGASTRODUODENOSCOPY (EGD);  Surgeon: Hulen Luster, MD;  Location: [facility] ENDOSCOPY;  Service: Gastroenterology;  Laterality: N/A;  . Hip surgery  2013  Right hip surgery    Current Outpatient Rx  Name  Route  Sig  Dispense  Refill  . aluminum-magnesium hydroxide-simethicone (MAALOX) 511-021-11 MG/5ML SUSP   Oral   Take by mouth.         Marland Kitchen amLODipine (NORVASC) 5 MG tablet   Oral   Take 1 tablet (5 mg total) by mouth daily.   90 tablet   1   . atorvastatin (LIPITOR) 20 MG tablet      TAKE ONE TABLET AT BEDTIME   90 tablet   2   . benazepril (LOTENSIN) 40 MG tablet      TAKE ONE (1) TABLET EACH DAY   90 tablet   1   . Calcium-Magnesium-Vitamin D 600-40-500 MG-MG-UNIT TB24   Oral   Take 630 mg by mouth 4 (four) times  daily.         [name] Sodium 30-100 MG CAPS   Oral   Take by mouth.         . Cholecalciferol (VITAMIN D3) 1000 UNITS CAPS   Oral   Take by mouth.         . doxycycline (VIBRAMYCIN) 100 MG capsule   Oral   Take 1 capsule (100 mg total) by mouth 2 (two) times daily.   20 capsule   0   . DULoxetine (CYMBALTA) 30 MG capsule            0   . fluticasone (FLONASE) 50 MCG/ACT nasal spray   Nasal   Place 2 sprays into the nose daily.         . furosemide (LASIX) 20 MG tablet      TAKE ONE (1) TABLET EACH DAY AS NEEDED   30 tablet   3   . gabapentin (NEURONTIN) 300 MG capsule   Oral   Take 300 mg by mouth 3 (three) times daily.         . hydrochlorothiazide (HYDRODIURIL) 25 MG tablet      TAKE ONE (1) TABLET EACH DAY   90 tablet   1   . HYDROcodone-acetaminophen (NORCO) 10-325 MG per tablet   Oral   Take 1 tablet by mouth every 4 (four) hours as needed.         . hydrOXYzine (ATARAX/VISTARIL) 25 MG tablet   Oral   Take 25 mg by mouth at bedtime.         . Inulin (FIBER CHOICE PO)   Oral   Take 2 tablets by mouth daily. Chew tablets         . magnesium oxide (MAG-OX) 400 MG tablet   Oral   Take 1 tablet (400 mg total) by mouth daily.   30 tablet   1   . metoprolol succinate (TOPROL-XL) 100 MG 24 hr tablet      TAKE ONE TABLET TWICE DAILY. TAKE WITH OR IMMEDIATELY FOLLOWING A MEAL   180 tablet   3   . morphine (MS CONTIN) 60 MG 12 hr tablet   Oral   Take 60 mg by mouth every 8 (eight) hours.         . Multiple Vitamins-Minerals (CENTRUM SILVER ADULT 50+ PO)   Oral   Take by mouth daily.         Marland Kitchen nystatin cream (MYCOSTATIN)   Topical   Apply topically 2 (two) times daily.   30 g   1   . ondansetron (ZOFRAN-ODT) 4 MG disintegrating tablet   Oral   Take 4  mg by mouth every 8 (eight) hours as needed for nausea or vomiting.         . pantoprazole (PROTONIX) 40 MG tablet   Oral   Take 40 mg by mouth 2 (two)  times daily.         . polyethylene glycol powder (GLYCOLAX/MIRALAX) powder      MIX 17 GRAMS (AS MARKED IN BOTTLE TOP) IN 8 OUNCES OF WATER, MIX AND DRINK ONCEA DAY AS DIRECTED.   527 g   2   . Probiotic Product (ALIGN PO)   Oral   Take by mouth daily.         . Simethicone (GAS-X PO)   Oral   Take by mouth.         . sodium chloride (OCEAN) 0.65 % nasal spray   Nasal   Place 1 spray into the nose as needed.         Marland Kitchen SYNTHROID 112 MCG tablet      TAKE ONE (1) TABLET EACH DAY   90 tablet   2     Dispense as written.     Allergies Biaxin; Levaquin; Lyrica; Nucynta er; Sertraline; Sulfa antibiotics; and Zoloft  Family History  Problem Relation Age of Onset  . Heart disease Mother   . Stroke Mother   . Heart disease Father     myocardial infarction age 90  . Hypertension Mother     Social History Social History  Substance Use Topics  . Smoking status: Never Smoker   . Smokeless tobacco: Never Used  . Alcohol Use: No    Review of Systems Constitutional: No fever/chills Eyes: No visual changes. ENT: No sore throat. Cardiovascular: Denies chest pain. Respiratory: Denies shortness of breath. Gastrointestinal: No abdominal pain.  No nausea, no vomiting.  No diarrhea.  No constipation. Genitourinary: Negative for dysuria. Musculoskeletal: Negative for back pain. Skin: Negative for rash except area about the size of her palm on her right shin. Neurological: Negative for headaches, focal weakness or numbness.  10-point ROS otherwise negative.  ____________________________________________   PHYSICAL EXAM:  VITAL SIGNS: ED Triage Vitals  Enc Vitals Group     BP 07/12/15 1618 150/104 mmHg     Pulse Rate 07/12/15 1618 64     Resp 07/12/15 1618 16     Temp 07/12/15 1618 98.2 F (36.8 C)     Temp Source 07/12/15 1618 Oral     SpO2 07/12/15 1618 95 %     Weight 07/12/15 1618 208 lb (94.348 kg)     Height 07/12/15 1618 _0  (1.575 m)     Head  Cir --      Peak Flow --      Pain Score --      Pain Loc --      Pain Edu? --      Excl. in [place]? --    Constitutional: Alert and oriented. Well appearing and in no acute distress. Eyes: Conjunctivae are normal. PERRL. EOMI. Head: Atraumatic. Nose: No congestion/rhinnorhea. Mouth/Throat: Mucous membranes are moist.  Oropharynx non-erythematous. Neck: No stridor.   Cardiovascular: Normal rate, regular rhythm. Grossly normal heart sounds.  Good peripheral circulation. Respiratory: Normal respiratory effort.  No retractions. Lungs CTAB. Gastrointestinal: Soft and nontender. No distention. No abdominal bruits. No CVA tenderness. Musculoskeletal: No lower extremity tenderness but 1+ lower extremity edema bilaterally.  No joint effusions. Normal dorsalis pedis pulses bilaterally with good capillary refill. There is an area approximately size of the palm of  the hand over the right anterior shin with erythema and some slight shiny appearance. Does have blanching erythema, no signs of induration or abscess. No redness or swelling in the left lower extremity. Neurologic:  Normal speech and language. No gross focal neurologic deficits are appreciated. No gait instability. Skin:  Skin is warm, dry and intact. No rash noted. Psychiatric: Mood and affect are normal. Speech and behavior are normal.  ____________________________________________   LABS (all labs ordered are listed, but only abnormal results are displayed)  Labs Reviewed  CBC - Abnormal; Notable for the following:    RBC 3.58 (*)    Hemoglobin 11.4 (*)    HCT 34.6 (*)    All other components within normal limits  BASIC METABOLIC PANEL - Abnormal; Notable for the following:    Chloride 99 (*)    All other components within normal limits   ____________________________________________  EKG   ____________________________________________  RADIOLOGY  IMPRESSION: No evidence of right lower extremity deep venous  thrombosis. ____________________________________________   PROCEDURES  Procedure(s) performed: None  Critical Care performed: No  ____________________________________________   INITIAL IMPRESSION / ASSESSMENT AND PLAN / ED COURSE  Pertinent labs & imaging results that were available during my care of the patient were reviewed by me and considered in my medical decision making (see chart for details).  Patient presents with concerns of right anterior shin erythema. Appears to be consistent with cellulitis, however given her previous history of DVT duplex was ordered to rule this out. She has no evidence of neurologic or systemic symptoms. She is awake alert and well-appearing. We discussed risks and benefits of treatment with antibiotics such as clindamycin. She is allergic to Bactrim, and we did discuss the risk of Clostridium difficile infection with associated with clindamycin. The patient will initiate clindamycin, she'll follow up closely with her doctor and return the emergency room if her redness has not improving within the proximal a 48 hours of starting antibiotics, she develops a fever, nausea, vomiting, severe pain or other new concerns arise.  Patient appears very appropriate and stable for outpatient management.  ----------------------------------------- 5:53 PM on 07/12/2015 -----------------------------------------  I will place the patient on doxycycline instead of clindamycin as she reports allergy to clarithromycin in her chart, which she did not initially verbally reported to me.  ----------------------------------------- 6:45 PM on 07/12/2015 -----------------------------------------  Ultrasound negative for DVT. We'll discharge the patient, placed on doxycycline with regular medications for close follow-up and return precautions advised patient and family. All agreeable with plan. ____________________________________________   FINAL CLINICAL IMPRESSION(S) / ED  DIAGNOSES  Final diagnoses:  Cellulitis of right leg      Delman Kitten, MD 07/12/15 1847

## 2015-07-12 NOTE — Telephone Encounter (Signed)
Received a call from Dr. Nicolasa Ducking this afternoon. Stating that Kimberly Roy was in her office now & her right lower leg doesn't look good at all. It is peeling, swollen & red. I have advised Dr. Nicolasa Ducking of your recommendations & she is going to have her go over to [facility] in from her office today.

## 2015-07-12 NOTE — Telephone Encounter (Signed)
Since I am not here this pm, please call pt.  If the leg is swollen and red, needs to be evaluated today.  To acute care or [facility] today.  Need to make sure no clot and if cellulitis - needs treatment.  We can then f/u after.

## 2015-07-13 ENCOUNTER — Encounter: Payer: Self-pay | Admitting: Internal Medicine

## 2015-07-13 ENCOUNTER — Ambulatory Visit (INDEPENDENT_AMBULATORY_CARE_PROVIDER_SITE_OTHER): Payer: PPO | Admitting: Internal Medicine

## 2015-07-13 VITALS — BP 142/80 | HR 70 | Temp 98.1°F | Resp 17 | Ht 65.0 in | Wt 211.0 lb

## 2015-07-13 DIAGNOSIS — R519 Headache, unspecified: Secondary | ICD-10-CM

## 2015-07-13 DIAGNOSIS — I10 Essential (primary) hypertension: Secondary | ICD-10-CM | POA: Diagnosis not present

## 2015-07-13 DIAGNOSIS — R51 Headache: Secondary | ICD-10-CM

## 2015-07-13 DIAGNOSIS — L03115 Cellulitis of right lower limb: Secondary | ICD-10-CM | POA: Diagnosis not present

## 2015-07-13 NOTE — Progress Notes (Signed)
Pre-visit discussion using our clinic review tool. No additional management support is needed unless otherwise documented below in the visit note.,

## 2015-07-13 NOTE — Telephone Encounter (Signed)
Notify pt that I am not in the office tomorrow.  I can see her this pm for work in or she can see someone tomorrow and we can f/u after if needed.  If wants evaluation today - 4:00 today.

## 2015-07-13 NOTE — Progress Notes (Signed)
Patient ID: Kimberly Roy, female   DOB: 05/19/1931, 81 y.o.   MRN: [id number]   Subjective:    Patient ID: Kimberly Roy, female    DOB: May 20, 1931, 81 y.o.   MRN: [id number]  HPI  Patient with past history of hypercholesterolemia, OA, anemia, depression and hypertension.  She comes in today as a work in for ER follow up.  She was seen yesterday and diagnosed with cellulitis.  Ultrasound negative for DVT.  Started on doxycline.  Tolerating.  Redness has improved.  Some swelling, but no significant edema.  No fever.  She is eating and drinking ok.  She also reports noticing her neck and head pain.  Posterior head pain.  Discussed with her today.  She had CT head a few months ago.  Unable to have MRI.  Sees neurology.  Discussed further evaluation and discussed referral back to neurology for evaluation.  Discussed posture and keeping her neck level with sleeping.  She is already on narcotic medications.  Avoid increased antiinflammatories.  No vision change.  No dizziness or light headedness.  Breathing stable.    Past Medical History  Diagnosis Date  . Osteoarthritis     knees/cervical and lumbar spine  . Hypertension   . Hyperlipidemia   . Hypothyroidism   . Anemia   . Depression   . Anxiety   . Nephrolithiasis   . Interstitial cystitis   . Pulmonary nodules     followed by Dr Raul Del  . Fatty liver   . Renal cyst     right  . Pulmonary hypertension ([place])   . Headache   . DVT (deep venous thrombosis) ([place])   . Fatty liver   . DDD (degenerative disc disease), cervical    Past Surgical History  Procedure Laterality Date  . Breast reduction surgery      3/99  . Back surgeries    . Appendectomy    . Cervical spine surgery    . Tonsilectomy/adenoidectomy with myringotomy    . Excisional hemorrhoidectomy    . Rotator cuff surgery      blilateral  . Knee arthroscopy      left and right  . Abdominal hysterectomy      ovaries left in place  . Cataracts Bilateral   . Hemorrhoid  surgery    . Esophagogastroduodenoscopy N/A 02/27/2015    Procedure: ESOPHAGOGASTRODUODENOSCOPY (EGD);  Surgeon: Hulen Luster, MD;  Location: [facility] ENDOSCOPY;  Service: Gastroenterology;  Laterality: N/A;  . Hip surgery  2013    Right hip surgery   Family History  Problem Relation Age of Onset  . Heart disease Mother   . Stroke Mother   . Heart disease Father     myocardial infarction age 67  . Hypertension Mother    Social History   Social History  . Marital Status: Married    Spouse Name: N/A  . Number of Children: 2  . Years of Education: N/A   Social History Main Topics  . Smoking status: Never Smoker   . Smokeless tobacco: Never Used  . Alcohol Use: No  . Drug Use: No  . Sexual Activity: Not Currently   Other Topics Concern  . None   Social History Narrative    Outpatient Encounter Prescriptions as of 07/13/2015  Medication Sig  . aluminum-magnesium hydroxide-simethicone (MAALOX) 951-884-16 MG/5ML SUSP Take by mouth.  Marland Kitchen amLODipine (NORVASC) 5 MG tablet Take 1 tablet (5 mg total) by mouth daily.  . Calcium-Magnesium-Vitamin D 606-30-160 MG-MG-UNIT  TB24 Take 630 mg by mouth 4 (four) times daily.  [name] Sodium 30-100 MG CAPS Take by mouth.  . Cholecalciferol (VITAMIN D3) 1000 UNITS CAPS Take by mouth.  . doxycycline (VIBRAMYCIN) 100 MG capsule Take 1 capsule (100 mg total) by mouth 2 (two) times daily.  . DULoxetine (CYMBALTA) 60 MG capsule Take 60 mg by mouth daily.  . fluticasone (FLONASE) 50 MCG/ACT nasal spray Place 2 sprays into the nose daily.  . furosemide (LASIX) 20 MG tablet TAKE ONE (1) TABLET EACH DAY AS NEEDED  . gabapentin (NEURONTIN) 300 MG capsule Take 300 mg by mouth 3 (three) times daily.  . hydrochlorothiazide (HYDRODIURIL) 25 MG tablet TAKE ONE (1) TABLET EACH DAY  . HYDROcodone-acetaminophen (NORCO) 10-325 MG per tablet Take 1 tablet by mouth every 4 (four) hours as needed.  . hydrOXYzine (ATARAX/VISTARIL) 25 MG tablet Take 25 mg by  mouth at bedtime.  . Inulin (FIBER CHOICE PO) Take 2 tablets by mouth daily. Chew tablets  . magnesium oxide (MAG-OX) 400 MG tablet Take 1 tablet (400 mg total) by mouth daily.  . metoprolol succinate (TOPROL-XL) 100 MG 24 hr tablet TAKE ONE TABLET TWICE DAILY. TAKE WITH OR IMMEDIATELY FOLLOWING A MEAL  . morphine (MS CONTIN) 60 MG 12 hr tablet Take 60 mg by mouth every 8 (eight) hours.  . Multiple Vitamins-Minerals (CENTRUM SILVER ADULT 50+ PO) Take by mouth daily.  Marland Kitchen nystatin cream (MYCOSTATIN) Apply topically 2 (two) times daily.  . ondansetron (ZOFRAN-ODT) 4 MG disintegrating tablet Take 4 mg by mouth every 8 (eight) hours as needed for nausea or vomiting.  . pantoprazole (PROTONIX) 40 MG tablet Take 40 mg by mouth 2 (two) times daily.  . polyethylene glycol powder (GLYCOLAX/MIRALAX) powder MIX 17 GRAMS (AS MARKED IN BOTTLE TOP) IN 8 OUNCES OF WATER, MIX AND DRINK ONCEA DAY AS DIRECTED.  . Probiotic Product (ALIGN PO) Take by mouth daily.  . Simethicone (GAS-X PO) Take by mouth.  . sodium chloride (OCEAN) 0.65 % nasal spray Place 1 spray into the nose as needed.  . [DISCONTINUED] atorvastatin (LIPITOR) 20 MG tablet TAKE ONE TABLET AT BEDTIME  . [DISCONTINUED] benazepril (LOTENSIN) 40 MG tablet TAKE ONE (1) TABLET EACH DAY  . [DISCONTINUED] DULoxetine (CYMBALTA) 30 MG capsule   . [DISCONTINUED] SYNTHROID 112 MCG tablet TAKE ONE (1) TABLET EACH DAY   No facility-administered encounter medications on file as of 07/13/2015.    Review of Systems     Objective:    Physical Exam  Constitutional: She appears well-developed and well-nourished. No distress.  Neck: Neck supple.  Cardiovascular: Normal rate and regular rhythm.   Pulmonary/Chest: Breath sounds normal. No respiratory distress. She has no wheezes.  Abdominal: Soft. There is no tenderness.  Musculoskeletal:  Some increased lower extremity edema.  No pain to palpation over her calf.    Lymphadenopathy:    She has no cervical  adenopathy.  Skin: There is erythema.  Erythema - lower extremity.  Improved.      BP 142/80 mmHg  Pulse 70  Temp(Src) 98.1 F (36.7 C) (Oral)  Resp 17  Ht _0  (1.651 m)  Wt 211 lb (95.709 kg)  BMI 35.11 kg/m2  SpO2 96% Wt Readings from Last 3 Encounters:  07/13/15 211 lb (95.709 kg)  07/12/15 208 lb (94.348 kg)  05/23/15 208 lb (94.348 kg)     Lab Results  Component Value Date   WBC 7.0 07/12/2015   HGB 11.4* 07/12/2015   HCT 34.6* 07/12/2015  PLT 198 07/12/2015   GLUCOSE 87 07/12/2015   CHOL 144 05/23/2015   TRIG 171.0* 05/23/2015   HDL 46.80 05/23/2015   LDLCALC 63 05/23/2015   ALT 34 05/23/2015   AST 18 05/23/2015   NA 139 07/12/2015   K 3.6 07/12/2015   CL 99* 07/12/2015   CREATININE 0.68 07/12/2015   BUN 14 07/12/2015   CO2 30 07/12/2015   TSH 1.10 02/10/2015   INR 1.0 12/08/2013   HGBA1C 6.0 10/19/2014    US Venous Img Lower Unilateral Right  07/12/2015  CLINICAL DATA:  Right leg swelling and redness for 3 days. EXAM: RIGHT LOWER EXTREMITY VENOUS DOPPLER ULTRASOUND TECHNIQUE: Gray-scale sonography with graded compression, as well as color Doppler and duplex ultrasound were performed to evaluate the lower extremity deep venous systems from the level of the common femoral vein and including the common femoral, femoral, profunda femoral, popliteal and calf veins including the posterior tibial, peroneal and gastrocnemius veins when visible. The superficial great saphenous vein was also interrogated. Spectral Doppler was utilized to evaluate flow at rest and with distal augmentation maneuvers in the common femoral, femoral and popliteal veins. COMPARISON:  Bilateral lower extremity duplex 05/10/2013 FINDINGS: Contralateral Common Femoral Vein: Respiratory phasicity is normal and symmetric with the symptomatic side. No evidence of thrombus. Normal compressibility. Common Femoral Vein: No evidence of thrombus. Normal compressibility, respiratory phasicity and response  to augmentation. Saphenofemoral Junction: No evidence of thrombus. Normal compressibility and flow on color Doppler imaging. Profunda Femoral Vein: No evidence of thrombus. Normal compressibility and flow on color Doppler imaging. Femoral Vein: No evidence of thrombus. Normal compressibility, respiratory phasicity and response to augmentation. Popliteal Vein: No evidence of thrombus. Normal compressibility, respiratory phasicity and response to augmentation. Calf Veins: No evidence of thrombus. Normal compressibility and flow on color Doppler imaging. Superficial Great Saphenous Vein: No evidence of thrombus. Normal compressibility and flow on color Doppler imaging. Venous Reflux:  None. Other Findings:  None. IMPRESSION: No evidence of right lower extremity deep venous thrombosis. Electronically Signed   By: Jeb Levering M.D.   On: 07/12/2015 18:41       Assessment & Plan:   Problem List Items Addressed This Visit    Cellulitis of leg, right - Primary    On doxycycline.  Redness improved.  Continue abx.  Continue probiotics.  Leg elevation.  Follow closely.        Headache    Discussed with her today.  Discussed that her neck issues may be contributing.  She is due f/u at pain clinic next week.  Work on posture.  Discussed further w/up and f/u with neurology.  She declines at this time.  Follow.        Relevant Medications   DULoxetine (CYMBALTA) 60 MG capsule   Hypertension    Blood pressure overall well controlled.  Follow.  Same medication regimen.          Einar Pheasant, MD

## 2015-07-14 ENCOUNTER — Other Ambulatory Visit: Payer: Self-pay | Admitting: Internal Medicine

## 2015-07-16 ENCOUNTER — Encounter: Payer: Self-pay | Admitting: Internal Medicine

## 2015-07-16 DIAGNOSIS — R519 Headache, unspecified: Secondary | ICD-10-CM | POA: Insufficient documentation

## 2015-07-16 DIAGNOSIS — R51 Headache: Secondary | ICD-10-CM

## 2015-07-16 DIAGNOSIS — L03115 Cellulitis of right lower limb: Secondary | ICD-10-CM | POA: Insufficient documentation

## 2015-07-16 NOTE — Assessment & Plan Note (Signed)
Blood pressure overall well controlled.  Follow.  Same medication regimen.

## 2015-07-16 NOTE — Assessment & Plan Note (Signed)
On doxycycline.  Redness improved.  Continue abx.  Continue probiotics.  Leg elevation.  Follow closely.

## 2015-07-16 NOTE — Assessment & Plan Note (Signed)
Discussed with her today.  Discussed that her neck issues may be contributing.  She is due f/u at pain clinic next week.  Work on posture.  Discussed further w/up and f/u with neurology.  She declines at this time.  Follow.

## 2015-07-24 ENCOUNTER — Telehealth: Payer: Self-pay | Admitting: *Deleted

## 2015-07-24 ENCOUNTER — Encounter: Payer: Self-pay | Admitting: Internal Medicine

## 2015-07-24 ENCOUNTER — Ambulatory Visit (INDEPENDENT_AMBULATORY_CARE_PROVIDER_SITE_OTHER): Payer: PPO | Admitting: Internal Medicine

## 2015-07-24 VITALS — BP 138/62 | HR 84 | Temp 98.1°F | Resp 18 | Ht 65.0 in | Wt 213.4 lb

## 2015-07-24 DIAGNOSIS — R1310 Dysphagia, unspecified: Secondary | ICD-10-CM | POA: Diagnosis not present

## 2015-07-24 DIAGNOSIS — I10 Essential (primary) hypertension: Secondary | ICD-10-CM

## 2015-07-24 DIAGNOSIS — E039 Hypothyroidism, unspecified: Secondary | ICD-10-CM

## 2015-07-24 DIAGNOSIS — M542 Cervicalgia: Secondary | ICD-10-CM

## 2015-07-24 DIAGNOSIS — E7800 Pure hypercholesterolemia, unspecified: Secondary | ICD-10-CM

## 2015-07-24 DIAGNOSIS — M7989 Other specified soft tissue disorders: Secondary | ICD-10-CM

## 2015-07-24 DIAGNOSIS — L03115 Cellulitis of right lower limb: Secondary | ICD-10-CM

## 2015-07-24 DIAGNOSIS — R253 Fasciculation: Secondary | ICD-10-CM

## 2015-07-24 DIAGNOSIS — R51 Headache: Secondary | ICD-10-CM

## 2015-07-24 DIAGNOSIS — R61 Generalized hyperhidrosis: Secondary | ICD-10-CM

## 2015-07-24 DIAGNOSIS — R258 Other abnormal involuntary movements: Secondary | ICD-10-CM

## 2015-07-24 DIAGNOSIS — R519 Headache, unspecified: Secondary | ICD-10-CM

## 2015-07-24 DIAGNOSIS — M5440 Lumbago with sciatica, unspecified side: Secondary | ICD-10-CM

## 2015-07-24 MED ORDER — DOXYCYCLINE HYCLATE 100 MG PO TABS: 100.0000 mg | ORAL_TABLET | Freq: Two times a day (BID) | ORAL | Status: DC

## 2015-07-24 NOTE — Telephone Encounter (Signed)
Patient requested to have a written antibiotic for the Holiday.

## 2015-07-24 NOTE — Progress Notes (Addendum)
Patient ID: ELYANAH FARINO, female   DOB: [birth year], 81 y.o.   MRN: [id number]   Subjective:    Patient ID: [name], female    DOB: Mar 03, 1931, 81 y.o.   MRN: [id number]  HPI  Patient with past history of hypertension, anemia, depression, interstitial cystitis and hypercholesterolemia.  She comes in today to follow up on these issues.  She is accompanied by her husband.  History obtained from both of them.  She has recently been treated for cellulitis of her lower extremity.  Is better.  Still some redness, but this appears to be more c/w stasis changes.  Having more issues with lower extremity swelling.  Had issues previously and required unna boots.  Breathing stable.  No chest pain or tightness.  Planning esophageal study 08/02/15.  Found to have left sacroiliac arthritis and hamstring and left hip adductor tendinosis.  Planning to see her pain MD next week.  With increased neck and back pain.  She is also still having increased sweats.  Intermittent in nature.  No cause found.  Eating and drinking well.  Sugar is ok.  Blood pressure ok.     Past Medical History  Diagnosis Date  . Osteoarthritis     knees/cervical and lumbar spine  . Hypertension   . Hyperlipidemia   . Hypothyroidism   . Anemia   . Depression   . Anxiety   . Nephrolithiasis   . Interstitial cystitis   . Pulmonary nodules     followed by Dr Raul Del  . Fatty liver   . Renal cyst     right  . Pulmonary hypertension ([place])   . Headache   . DVT (deep venous thrombosis) ([place])   . Fatty liver   . DDD (degenerative disc disease), cervical    Past Surgical History  Procedure Laterality Date  . Breast reduction surgery      3/99  . Back surgeries    . Appendectomy    . Cervical spine surgery    . Tonsilectomy/adenoidectomy with myringotomy    . Excisional hemorrhoidectomy    . Rotator cuff surgery      blilateral  . Knee arthroscopy      left and right  . Abdominal hysterectomy      ovaries left in place  .  Cataracts Bilateral   . Hemorrhoid surgery    . Esophagogastroduodenoscopy N/A 02/27/2015    Procedure: ESOPHAGOGASTRODUODENOSCOPY (EGD);  Surgeon: Hulen Luster, MD;  Location: [facility] ENDOSCOPY;  Service: Gastroenterology;  Laterality: N/A;  . Hip surgery  2013    Right hip surgery   Family History  Problem Relation Age of Onset  . Heart disease Mother   . Stroke Mother   . Heart disease Father     myocardial infarction age 98  . Hypertension Mother    Social History   Social History  . Marital Status: Married    Spouse Name: N/A  . Number of Children: 2  . Years of Education: N/A   Social History Main Topics  . Smoking status: Never Smoker   . Smokeless tobacco: Never Used  . Alcohol Use: No  . Drug Use: No  . Sexual Activity: Not Currently   Other Topics Concern  . None   Social History Narrative    Outpatient Encounter Prescriptions as of 07/24/2015  Medication Sig  . aluminum-magnesium hydroxide-simethicone (MAALOX) [phone] MG/5ML SUSP Take by mouth.  Marland Kitchen amLODipine (NORVASC) 5 MG tablet Take 1 tablet (5 mg total) by  mouth daily.  Marland Kitchen atorvastatin (LIPITOR) 20 MG tablet TAKE ONE TABLET AT BEDTIME  . benazepril (LOTENSIN) 40 MG tablet TAKE ONE (1) TABLET EACH DAY  . Calcium-Magnesium-Vitamin D 600-40-500 MG-MG-UNIT TB24 Take 630 mg by mouth 4 (four) times daily.  [name] Sodium 30-100 MG CAPS Take by mouth.  . Cholecalciferol (VITAMIN D3) 1000 UNITS CAPS Take by mouth.  . DULoxetine (CYMBALTA) 60 MG capsule Take 60 mg by mouth daily.  . fluticasone (FLONASE) 50 MCG/ACT nasal spray Place 2 sprays into the nose daily.  . furosemide (LASIX) 20 MG tablet TAKE ONE (1) TABLET EACH DAY AS NEEDED  . gabapentin (NEURONTIN) 300 MG capsule Take 300 mg by mouth 3 (three) times daily.  . hydrochlorothiazide (HYDRODIURIL) 25 MG tablet TAKE ONE (1) TABLET EACH DAY  . HYDROcodone-acetaminophen (NORCO) 10-325 MG per tablet Take 1 tablet by mouth every 4 (four) hours as  needed.  . hydrOXYzine (ATARAX/VISTARIL) 25 MG tablet Take 25 mg by mouth at bedtime.  . Inulin (FIBER CHOICE PO) Take 2 tablets by mouth daily. Chew tablets  . magnesium oxide (MAG-OX) 400 MG tablet Take 1 tablet (400 mg total) by mouth daily.  . metoprolol succinate (TOPROL-XL) 100 MG 24 hr tablet TAKE ONE TABLET TWICE DAILY. TAKE WITH OR IMMEDIATELY FOLLOWING A MEAL  . morphine (MS CONTIN) 60 MG 12 hr tablet Take 60 mg by mouth every 8 (eight) hours.  . Multiple Vitamins-Minerals (CENTRUM SILVER ADULT 50+ PO) Take by mouth daily.  Marland Kitchen nystatin cream (MYCOSTATIN) Apply topically 2 (two) times daily.  . ondansetron (ZOFRAN-ODT) 4 MG disintegrating tablet Take 4 mg by mouth every 8 (eight) hours as needed for nausea or vomiting.  . pantoprazole (PROTONIX) 40 MG tablet Take 40 mg by mouth 2 (two) times daily.  . polyethylene glycol powder (GLYCOLAX/MIRALAX) powder MIX 17 GRAMS (AS MARKED IN BOTTLE TOP) IN 8 OUNCES OF WATER, MIX AND DRINK ONCEA DAY AS DIRECTED.  . Probiotic Product (ALIGN PO) Take by mouth daily.  . Simethicone (GAS-X PO) Take by mouth.  . sodium chloride (OCEAN) 0.65 % nasal spray Place 1 spray into the nose as needed.  Marland Kitchen SYNTHROID 112 MCG tablet TAKE ONE (1) TABLET EACH DAY  . [DISCONTINUED] doxycycline (VIBRAMYCIN) 100 MG capsule Take 1 capsule (100 mg total) by mouth 2 (two) times daily.   No facility-administered encounter medications on file as of 07/24/2015.    Review of Systems  Constitutional: Negative for fever and appetite change.  HENT: Negative for congestion and sinus pressure.   Respiratory: Negative for cough, chest tightness and shortness of breath.   Cardiovascular: Positive for leg swelling. Negative for chest pain and palpitations.  Gastrointestinal: Negative for nausea, vomiting, abdominal pain and diarrhea.  Genitourinary: Negative for dysuria and difficulty urinating.  Musculoskeletal: Positive for back pain (chronic back pain and neck pain.  ) and neck  pain.  Skin:       Changes lower extremities - appear to be c/w stasis changes.   Neurological: Positive for headaches (persistent.  no worsening.  feels may be from her neck. ). Negative for dizziness and light-headedness.  Psychiatric/Behavioral: Negative for dysphoric mood and agitation.       Objective:     Blood pressure rechecked by me:  138/62  Physical Exam  Constitutional: She appears well-developed and well-nourished. No distress.  HENT:  Nose: Nose normal.  Mouth/Throat: Oropharynx is clear and moist.  Eyes: Conjunctivae are normal. Right eye exhibits no discharge. Left eye exhibits no discharge.  Neck: Neck supple. No thyromegaly present.  Cardiovascular: Normal rate and regular rhythm.   Pulmonary/Chest: Breath sounds normal. No respiratory distress. She has no wheezes.  Abdominal: Soft. Bowel sounds are normal. There is no tenderness.  Musculoskeletal: She exhibits no tenderness.  Lower extremity swelling - mostly ankle and pedal edema.    Lymphadenopathy:    She has no cervical adenopathy.  Skin: There is erythema.  Stasis changes present.    Psychiatric: She has a normal mood and affect. Her behavior is normal.    BP 138/62 mmHg  Pulse 84  Temp(Src) 98.1 F (36.7 C) (Oral)  Resp 18  Ht _0  (1.651 m)  Wt 213 lb 6 oz (96.786 kg)  BMI 35.51 kg/m2  SpO2 94% Wt Readings from Last 3 Encounters:  07/24/15 213 lb 6 oz (96.786 kg)  07/13/15 211 lb (95.709 kg)  07/12/15 208 lb (94.348 kg)     Lab Results  Component Value Date   WBC 7.0 07/12/2015   HGB 11.4* 07/12/2015   HCT 34.6* 07/12/2015   PLT 198 07/12/2015   GLUCOSE 87 07/12/2015   CHOL 144 05/23/2015   TRIG 171.0* 05/23/2015   HDL 46.80 05/23/2015   LDLCALC 63 05/23/2015   ALT 34 05/23/2015   AST 18 05/23/2015   NA 139 07/12/2015   K 3.6 07/12/2015   CL 99* 07/12/2015   CREATININE 0.68 07/12/2015   BUN 14 07/12/2015   CO2 30 07/12/2015   TSH 1.10 02/10/2015   INR 1.0 12/08/2013    HGBA1C 6.0 10/19/2014    US Venous Img Lower Unilateral Right  07/12/2015  CLINICAL DATA:  Right leg swelling and redness for 3 days. EXAM: RIGHT LOWER EXTREMITY VENOUS DOPPLER ULTRASOUND TECHNIQUE: Gray-scale sonography with graded compression, as well as color Doppler and duplex ultrasound were performed to evaluate the lower extremity deep venous systems from the level of the common femoral vein and including the common femoral, femoral, profunda femoral, popliteal and calf veins including the posterior tibial, peroneal and gastrocnemius veins when visible. The superficial great saphenous vein was also interrogated. Spectral Doppler was utilized to evaluate flow at rest and with distal augmentation maneuvers in the common femoral, femoral and popliteal veins. COMPARISON:  Bilateral lower extremity duplex 05/10/2013 FINDINGS: Contralateral Common Femoral Vein: Respiratory phasicity is normal and symmetric with the symptomatic side. No evidence of thrombus. Normal compressibility. Common Femoral Vein: No evidence of thrombus. Normal compressibility, respiratory phasicity and response to augmentation. Saphenofemoral Junction: No evidence of thrombus. Normal compressibility and flow on color Doppler imaging. Profunda Femoral Vein: No evidence of thrombus. Normal compressibility and flow on color Doppler imaging. Femoral Vein: No evidence of thrombus. Normal compressibility, respiratory phasicity and response to augmentation. Popliteal Vein: No evidence of thrombus. Normal compressibility, respiratory phasicity and response to augmentation. Calf Veins: No evidence of thrombus. Normal compressibility and flow on color Doppler imaging. Superficial Great Saphenous Vein: No evidence of thrombus. Normal compressibility and flow on color Doppler imaging. Venous Reflux:  None. Other Findings:  None. IMPRESSION: No evidence of right lower extremity deep venous thrombosis. Electronically Signed   By: Jeb Levering M.D.    On: 07/12/2015 18:41       Assessment & Plan:   Problem List Items Addressed This Visit    Back pain    Increased pain.  Plans to f/u with her back/pain MD next week.        Cellulitis of leg, right    Leg is better.  The redness  present now appears to be more c/w stasis changes.  Hold on abx.  Have vascular surgery evaluate.        Dysphagia    EGD as outlined 02/27/15.  S/p dilation.  Planning for esophageal study 08/02/15.  Followed by GI.       Excessive sweating    Persistent intermittent episodes.  No clear etiology.  Sugar ok.  Blood pressure ok.  Will have endocrinology evaluate.        Relevant Orders   Ambulatory referral to Endocrinology   Headache    Discussed with her today.  I brought it up.  Some persistence.  She plans to discuss her neck issues next week with pain MD.  Desires no further w/up at this time.        Hypercholesterolemia    On lipitor.  Low cholesterol diet and exercise.  Follow lipid panel and liver function tests.        Hypertension - Primary    Blood pressure has been under good control.  Recheck today improved.  Continue same medication regimen.  Follow pressures.  Follow metabolic panel.        Hypothyroidism    On thyroid replacement.  Follow tsh.        Leg swelling    Having problems with increased swelling recently.  Leg elevation.  Compression hose.  Will have vascular surgery reevaluate.  Has seen previously.        Muscle twitching    Seeing neurology.        Neck pain    Had CT neck recently.  Seeing Dr Farrell Ours.  Plans to discuss next week.            Einar Pheasant, MD

## 2015-07-24 NOTE — Telephone Encounter (Signed)
rx printed and placed on your desk.   Please notify pt.  My fault.  I forgot to give to them when here.

## 2015-07-24 NOTE — Telephone Encounter (Signed)
Please advise?  

## 2015-07-24 NOTE — Telephone Encounter (Signed)
Spoke with the patient, her husband is going to come and pick up the prescription.

## 2015-07-24 NOTE — Progress Notes (Signed)
Pre-visit discussion using our clinic review tool. No additional management support is needed unless otherwise documented below in the visit note.

## 2015-07-25 ENCOUNTER — Encounter: Payer: Self-pay | Admitting: Internal Medicine

## 2015-07-25 DIAGNOSIS — M7989 Other specified soft tissue disorders: Secondary | ICD-10-CM

## 2015-07-25 NOTE — Telephone Encounter (Signed)
Order placed for referral.

## 2015-07-30 ENCOUNTER — Encounter: Payer: Self-pay | Admitting: Internal Medicine

## 2015-07-30 DIAGNOSIS — R61 Generalized hyperhidrosis: Secondary | ICD-10-CM | POA: Insufficient documentation

## 2015-07-30 NOTE — Assessment & Plan Note (Signed)
On lipitor.  Low cholesterol diet and exercise.  Follow lipid panel and liver function tests.   

## 2015-07-30 NOTE — Assessment & Plan Note (Signed)
On thyroid replacement.  Follow tsh.

## 2015-07-30 NOTE — Assessment & Plan Note (Signed)
Having problems with increased swelling recently.  Leg elevation.  Compression hose.  Will have vascular surgery reevaluate.  Has seen previously.

## 2015-07-30 NOTE — Assessment & Plan Note (Signed)
Leg is better.  The redness present now appears to be more c/w stasis changes.  Hold on abx.  Have vascular surgery evaluate.

## 2015-07-30 NOTE — Addendum Note (Signed)
Addended by: Alisa Graff on: 07/30/2015 02:22 PM   Modules accepted: Orders

## 2015-07-30 NOTE — Assessment & Plan Note (Signed)
Blood pressure has been under good control.  Recheck today improved.  Continue same medication regimen.  Follow pressures.  Follow metabolic panel.

## 2015-07-30 NOTE — Assessment & Plan Note (Signed)
Discussed with her today.  I brought it up.  Some persistence.  She plans to discuss her neck issues next week with pain MD.  Desires no further w/up at this time.

## 2015-07-30 NOTE — Assessment & Plan Note (Signed)
Increased pain.  Plans to f/u with her back/pain MD next week.

## 2015-07-30 NOTE — Assessment & Plan Note (Signed)
EGD as outlined 02/27/15.  S/p dilation.  Planning for esophageal study 08/02/15.  Followed by GI.

## 2015-07-30 NOTE — Assessment & Plan Note (Signed)
Had CT neck recently.  Seeing Dr Farrell Ours.  Plans to discuss next week.

## 2015-07-30 NOTE — Assessment & Plan Note (Signed)
Persistent intermittent episodes.  No clear etiology.  Sugar ok.  Blood pressure ok.  Will have endocrinology evaluate.

## 2015-07-30 NOTE — Assessment & Plan Note (Signed)
Seeing neurology.

## 2015-07-31 ENCOUNTER — Encounter: Payer: Self-pay | Admitting: Internal Medicine

## 2015-07-31 NOTE — Telephone Encounter (Signed)
Given symptoms, please call and informa pt that if having significant pain and hot and sweating, I recommend evaluation today.  I am unable to work in this pm.  [facility] or [facility].

## 2015-07-31 NOTE — Telephone Encounter (Signed)
Pt notified & does not want to go to [facility] any longer. However, she did agree to go to [facility]. She states it will have to be first thing in the morning.

## 2015-08-01 ENCOUNTER — Ambulatory Visit
Admission: EM | Admit: 2015-08-01 | Discharge: 2015-08-01 | Disposition: A | Discharge status: Home / Self Care | Payer: PPO | Attending: Family Medicine | Admitting: Family Medicine

## 2015-08-01 ENCOUNTER — Encounter: Payer: Self-pay | Admitting: *Deleted

## 2015-08-01 DIAGNOSIS — N390 Urinary tract infection, site not specified: Secondary | ICD-10-CM | POA: Diagnosis not present

## 2015-08-01 LAB — URINALYSIS COMPLETE WITH MICROSCOPIC (ARMC ONLY)
Bilirubin Urine: NEGATIVE
Glucose, UA: NEGATIVE mg/dL
Hgb urine dipstick: NEGATIVE
Ketones, ur: NEGATIVE mg/dL
Nitrite: NEGATIVE
Protein, ur: NEGATIVE mg/dL
RBC / HPF: NONE SEEN RBC/hpf (ref 0–5)
Specific Gravity, Urine: 1.01 (ref 1.005–1.030)
pH: 7 (ref 5.0–8.0)

## 2015-08-01 MED ORDER — CIPROFLOXACIN HCL 500 MG PO TABS: 500.0000 mg | ORAL_TABLET | Freq: Two times a day (BID) | ORAL | Status: DC

## 2015-08-01 MED ORDER — PHENAZOPYRIDINE HCL 200 MG PO TABS: 200.0000 mg | ORAL_TABLET | Freq: Three times a day (TID) | ORAL | Status: DC | PRN

## 2015-08-01 NOTE — ED Provider Notes (Signed)
CSN: [id number]     Arrival date & time 08/01/15  1144 History   First MD Initiated Contact with Patient 08/01/15 1313    Nurses notes were reviewed. Chief Complaint  Patient presents with  . Urinary Tract Infection   Patient is a elderly white female who's had a UTI symptoms or lower abdominal pain. This started since Friday. She had a history of kidney stones before but also history of UTIs before as well. She has not seen any bloody urine of this time.  (Consider location/radiation/quality/duration/timing/severity/associated sxs/prior Treatment) Patient is a 81 y.o. female presenting with urinary tract infection. The history is provided by the patient. No language interpreter was used.  Urinary Tract Infection Pain quality:  Sharp and stabbing Pain severity:  Moderate Onset quality:  Sudden Timing:  Constant Progression:  Worsening Chronicity:  New Recent urinary tract infections: no (Is been a year since her last UTI she can only take Macrobid and Cipro for UTIs)   Relieved by:  Nothing Urinary symptoms: frequent urination   Urinary symptoms: no discolored urine, no foul-smelling urine, no hematuria, no hesitancy and no bladder incontinence   Associated symptoms: no abdominal pain, no fever, no flank pain and no vaginal discharge   Risk factors: hx of urolithiasis, recurrent urinary tract infections, renal disease, sexually transmitted infections and urinary catheter use   Risk factors: no hx of pyelonephritis     Past Medical History  Diagnosis Date  . Osteoarthritis     knees/cervical and lumbar spine  . Hypertension   . Hyperlipidemia   . Hypothyroidism   . Anemia   . Depression   . Anxiety   . Nephrolithiasis   . Interstitial cystitis   . Pulmonary nodules     followed by Dr Raul Del  . Fatty liver   . Renal cyst     right  . Pulmonary hypertension ([place])   . Headache   . DVT (deep venous thrombosis) ([place])   . Fatty liver   . DDD (degenerative disc disease),  cervical    Past Surgical History  Procedure Laterality Date  . Breast reduction surgery      3/99  . Back surgeries    . Appendectomy    . Cervical spine surgery    . Tonsilectomy/adenoidectomy with myringotomy    . Excisional hemorrhoidectomy    . Rotator cuff surgery      blilateral  . Knee arthroscopy      left and right  . Abdominal hysterectomy      ovaries left in place  . Cataracts Bilateral   . Hemorrhoid surgery    . Esophagogastroduodenoscopy N/A 02/27/2015    Procedure: ESOPHAGOGASTRODUODENOSCOPY (EGD);  Surgeon: Hulen Luster, MD;  Location: [facility] ENDOSCOPY;  Service: Gastroenterology;  Laterality: N/A;  . Hip surgery  2013    Right hip surgery   Family History  Problem Relation Age of Onset  . Heart disease Mother   . Stroke Mother   . Heart disease Father     myocardial infarction age 59  . Hypertension Mother    Social History  Substance Use Topics  . Smoking status: Never Smoker   . Smokeless tobacco: Never Used  . Alcohol Use: No   OB History    No data available     Review of Systems  Constitutional: Negative for fever.  Gastrointestinal: Negative for abdominal pain.  Genitourinary: Negative for flank pain and vaginal discharge.  All other systems reviewed and are negative.   Allergies  Biaxin; Levaquin; Lyrica; Nucynta er; Sertraline; Sulfa antibiotics; and Zoloft  Home Medications   Prior to Admission medications   Medication Sig Start Date End Date Taking? Authorizing Provider  aluminum-magnesium hydroxide-simethicone (MAALOX) [phone] MG/5ML SUSP Take by mouth.   Yes Historical Provider, MD  amLODipine (NORVASC) 5 MG tablet Take 1 tablet (5 mg total) by mouth daily. 03/02/15  Yes Einar Pheasant, MD  atorvastatin (LIPITOR) 20 MG tablet TAKE ONE TABLET AT BEDTIME 07/14/15  Yes Einar Pheasant, MD  benazepril (LOTENSIN) 40 MG tablet TAKE ONE (1) TABLET EACH DAY 07/14/15  Yes Einar Pheasant, MD  Calcium-Magnesium-Vitamin D 600-40-500 MG-MG-UNIT  TB24 Take 630 mg by mouth 4 (four) times daily.   Yes Historical Provider, MD  Casanthranol-Docusate Sodium 30-100 MG CAPS Take by mouth.   Yes Historical Provider, MD  Cholecalciferol (VITAMIN D3) 1000 UNITS CAPS Take by mouth.   Yes Historical Provider, MD  DULoxetine (CYMBALTA) 60 MG capsule Take 60 mg by mouth daily.   Yes Historical Provider, MD  furosemide (LASIX) 20 MG tablet TAKE ONE (1) TABLET EACH DAY AS NEEDED 06/05/15  Yes Einar Pheasant, MD  gabapentin (NEURONTIN) 300 MG capsule Take 300 mg by mouth 3 (three) times daily.   Yes Historical Provider, MD  hydrochlorothiazide (HYDRODIURIL) 25 MG tablet TAKE ONE (1) TABLET EACH DAY 11/03/14  Yes Einar Pheasant, MD  metoprolol succinate (TOPROL-XL) 100 MG 24 hr tablet TAKE ONE TABLET TWICE DAILY. TAKE WITH OR IMMEDIATELY FOLLOWING A MEAL 12/12/14  Yes Jackolyn Confer, MD  morphine (MS CONTIN) 60 MG 12 hr tablet Take 60 mg by mouth every 8 (eight) hours.   Yes Historical Provider, MD  pantoprazole (PROTONIX) 40 MG tablet Take 40 mg by mouth 2 (two) times daily.   Yes Historical Provider, MD  Probiotic Product (ALIGN PO) Take by mouth daily.   Yes Historical Provider, MD  Simethicone (GAS-X PO) Take by mouth.   Yes Historical Provider, MD  SYNTHROID 112 MCG tablet TAKE ONE (1) TABLET EACH DAY 07/14/15  Yes Einar Pheasant, MD  ciprofloxacin (CIPRO) 500 MG tablet Take 1 tablet (500 mg total) by mouth 2 (two) times daily. 08/01/15   Frederich Cha, MD  doxycycline (VIBRA-TABS) 100 MG tablet Take 1 tablet (100 mg total) by mouth 2 (two) times daily. 07/24/15   Einar Pheasant, MD  fluticasone (FLONASE) 50 MCG/ACT nasal spray Place 2 sprays into the nose daily.    Historical Provider, MD  HYDROcodone-acetaminophen (NORCO) 10-325 MG per tablet Take 1 tablet by mouth every 4 (four) hours as needed.    Historical Provider, MD  hydrOXYzine (ATARAX/VISTARIL) 25 MG tablet Take 25 mg by mouth at bedtime.    Historical Provider, MD  Inulin (FIBER CHOICE PO) Take  2 tablets by mouth daily. Chew tablets    Historical Provider, MD  magnesium oxide (MAG-OX) 400 MG tablet Take 1 tablet (400 mg total) by mouth daily. 02/11/15   Einar Pheasant, MD  Multiple Vitamins-Minerals (CENTRUM SILVER ADULT 50+ PO) Take by mouth daily.    Historical Provider, MD  nystatin cream (MYCOSTATIN) Apply topically 2 (two) times daily. 07/11/14   Einar Pheasant, MD  ondansetron (ZOFRAN-ODT) 4 MG disintegrating tablet Take 4 mg by mouth every 8 (eight) hours as needed for nausea or vomiting.    Historical Provider, MD  phenazopyridine (PYRIDIUM) 200 MG tablet Take 1 tablet (200 mg total) by mouth 3 (three) times daily as needed for pain. 08/01/15   Frederich Cha, MD  polyethylene glycol powder (GLYCOLAX/MIRALAX) powder MIX  17 GRAMS (AS MARKED IN BOTTLE TOP) IN 8 OUNCES OF WATER, MIX AND DRINK ONCEA DAY AS DIRECTED. 07/05/15   Einar Pheasant, MD  sodium chloride (OCEAN) 0.65 % nasal spray Place 1 spray into the nose as needed.    Historical Provider, MD   Meds Ordered and Administered this Visit  Medications - No data to display  BP 172/62 mmHg  Pulse 56  Temp(Src) 97.3 F (36.3 C) (Tympanic)  Resp 18  Ht 5' 2" (1.575 m)  Wt 205 lb (92.987 kg)  BMI 37.49 kg/m2  SpO2 99% No data found.   Physical Exam  Constitutional: She is oriented to person, place, and time. She appears well-developed and well-nourished.  HENT:  Head: Normocephalic and atraumatic.  Eyes: Conjunctivae are normal. Pupils are equal, round, and reactive to light.  Abdominal: Soft. Bowel sounds are normal. She exhibits no distension.  Musculoskeletal: Normal range of motion.  Neurological: She is alert and oriented to person, place, and time.  Skin: Skin is warm.  Psychiatric: She has a normal mood and affect.  Vitals reviewed.   ED Course  Procedures (including critical care time)  Labs Review Labs Reviewed  URINALYSIS COMPLETEWITH MICROSCOPIC ([facility] ONLY) - Abnormal; Notable for the following:     Leukocytes, UA TRACE (*)    Bacteria, UA MANY (*)    Squamous Epithelial / LPF 6-30 (*)    All other components within normal limits  URINE CULTURE    Imaging Review No results found.   Visual Acuity Review  Right Eye Distance:   Left Eye Distance:   Bilateral Distance:    Right Eye Near:   Left Eye Near:    Bilateral Near:     Results for orders placed or performed during the hospital encounter of 08/01/15  Urinalysis complete, with microscopic  Result Value Ref Range   Color, Urine YELLOW YELLOW   APPearance CLEAR CLEAR   Glucose, UA NEGATIVE NEGATIVE mg/dL   Bilirubin Urine NEGATIVE NEGATIVE   Ketones, ur NEGATIVE NEGATIVE mg/dL   Specific Gravity, Urine 1.010 1.005 - 1.030   Hgb urine dipstick NEGATIVE NEGATIVE   pH 7.0 5.0 - 8.0   Protein, ur NEGATIVE NEGATIVE mg/dL   Nitrite NEGATIVE NEGATIVE   Leukocytes, UA TRACE (A) NEGATIVE   RBC / HPF NONE SEEN 0 - 5 RBC/hpf   WBC, UA 6-30 0 - 5 WBC/hpf   Bacteria, UA MANY (A) NONE SEEN   Squamous Epithelial / LPF 6-30 (A) NONE SEEN      MDM   1. UTI (lower urinary tract infection)     Explained patient this is not a good urine specimen as above of epithelial cells indicating bacterial contamination but will go with what we have. Since she does have many bacteria but no blood and urine will treat for UTI as for culture the urine will place her on Cipro 500 mg 1 tablet twice day for 7 days her pH the urine is 7 and also pain placed on Pyridium 200 mg 1 tablet 3 times a day as well follow-up with her PCP if not better in a week.     Frederich Cha, MD 08/01/15 1345

## 2015-08-01 NOTE — Discharge Instructions (Signed)
Antibiotic Medicine Antibiotic medicines are used to treat infections caused by bacteria. They work by hurting or killing the germs that are making you sick. HOW WILL MY MEDICINE BE PICKED? There are many kinds of antibiotic medicines. To help your doctor pick one, tell your doctor if:  You have any allergies.  You are pregnant or plan to get pregnant.  You are breastfeeding.  You are taking any medicines. These include over-the-counter medicines, prescription medicines, and herbal remedies.  You have a medical condition or problem. If you have questions about why your medicine was picked, ask. FOR HOW LONG SHOULD I TAKE MY MEDICINE? Take your medicine for as long as your doctor tells you to. Do not stop taking it when you feel better. If you stop taking it too soon:  You may start to feel sick again.  Your infection may get harder to treat.  New problems may develop. WHAT IF I MISS A DOSE? Try not to miss any doses of antibiotic medicine. If you miss a dose:  Take the dose as soon as you can.  If you are taking 2 doses a day, take the next dose in 5 to 6 hours.  If you are taking 3 or more doses a day, take the next dose in 2 to 4 hours. Then go back to the normal schedule. If you cannot take a missed dose, take the next dose on time. Then take the missed dose after you have taken all the doses as told by your doctor, as if you had one more dose left. DOES THIS MEDICINE AFFECT BIRTH CONTROL? Birth control pills may not work while you are on antibiotic medicines. If you are taking birth control pills, keep taking them as usual. Use a second form of birth control, such as a condom. Keep using the second form of birth control until you are finished with your current 1 month cycle of birth control pills. GET HELP IF:  You get worse.  You do not feel better a few days after starting the medicine.  You throw up (vomit).  There are white patches in your mouth.  You have new  joint pain after starting the medicine.  You have new muscle aches after starting the medicine.  You had a fever before starting the medicine, and it comes back.  You have any symptoms of an allergic reaction, such as an itchy rash. If this happens, stop taking the medicine. GET HELP RIGHT AWAY IF:  Your pee (urine) turns dark or becomes blood-colored.  Your skin turns yellow.  You bruise or bleed easily.  You have very bad watery poop (diarrhea) and cramps in your belly (abdomen).  You have a very bad headache.  You have signs of a very bad allergic reaction, such as:  Trouble breathing.  Wheezing.  Swelling of the lips, tongue, or face.  Fainting.  Blisters on the skin or in the mouth. If you have signs of a very bad allergic reaction, stop taking the antibiotic medicine right away.   This information is not intended to replace advice given to you by your health care provider. Make sure you discuss any questions you have with your health care provider.   Document Released: 05/28/2008 Document Revised: 05/10/2015 Document Reviewed: 01/04/2015 Elsevier Interactive Patient Education 2016 Elsevier Inc.  Urinary Tract Infection A urinary tract infection (UTI) can occur any place along the urinary tract. The tract includes the kidneys, ureters, bladder, and urethra. A type of germ called bacteria  often causes a UTI. UTIs are often helped with antibiotic medicine.  HOME CARE   If given, take antibiotics as told by your doctor. Finish them even if you start to feel better.  Drink enough fluids to keep your pee (urine) clear or pale yellow.  Avoid tea, drinks with caffeine, and bubbly (carbonated) drinks.  Pee often. Avoid holding your pee in for a long time.  Pee before and after having sex (intercourse).  Wipe from front to back after you poop (bowel movement) if you are a woman. Use each tissue only once. GET HELP RIGHT AWAY IF:   You have back pain.  You have  lower belly (abdominal) pain.  You have chills.  You feel sick to your stomach (nauseous).  You throw up (vomit).  Your burning or discomfort with peeing does not go away.  You have a fever.  Your symptoms are not better in 3 days. MAKE SURE YOU:   Understand these instructions.  Will watch your condition.  Will get help right away if you are not doing well or get worse.   This information is not intended to replace advice given to you by your health care provider. Make sure you discuss any questions you have with your health care provider.   Document Released: 02/05/2008 Document Revised: 09/09/2014 Document Reviewed: 03/19/2012 Elsevier Interactive Patient Education [employer].

## 2015-08-01 NOTE — ED Notes (Signed)
Started last Friday with burning with voiding, urgency, suprapubic pain

## 2015-08-02 ENCOUNTER — Encounter
Admission: RE | Disposition: A | Discharge status: Home / Self Care | Payer: Self-pay | Source: Ambulatory Visit | Attending: Gastroenterology

## 2015-08-02 ENCOUNTER — Ambulatory Visit
Admission: RE | Admit: 2015-08-02 | Discharge: 2015-08-02 | Disposition: A | Discharge status: Home / Self Care | Payer: PPO | Source: Ambulatory Visit | Attending: Gastroenterology | Admitting: Gastroenterology

## 2015-08-02 DIAGNOSIS — K220 Achalasia of cardia: Secondary | ICD-10-CM | POA: Insufficient documentation

## 2015-08-02 DIAGNOSIS — R1310 Dysphagia, unspecified: Secondary | ICD-10-CM | POA: Insufficient documentation

## 2015-08-02 HISTORY — DX: Constipation, unspecified: K59.00

## 2015-08-02 HISTORY — DX: Essential (primary) hypertension: I10

## 2015-08-02 HISTORY — DX: Pure hypercholesterolemia, unspecified: E78.00

## 2015-08-02 HISTORY — DX: Heart disease, unspecified: I51.9

## 2015-08-02 HISTORY — DX: Lymphedema, not elsewhere classified: I89.0

## 2015-08-02 HISTORY — DX: Obstructive sleep apnea (adult) (pediatric): G47.33

## 2015-08-02 HISTORY — DX: Chest pain, unspecified: R07.9

## 2015-08-02 HISTORY — DX: Dysphonia: R49.0

## 2015-08-02 HISTORY — DX: Dyspnea, unspecified: R06.00

## 2015-08-02 HISTORY — DX: Heart failure, unspecified: I50.9

## 2015-08-02 HISTORY — PX: ESOPHAGEAL MANOMETRY: SHX5429

## 2015-08-02 SURGERY — MANOMETRY, ESOPHAGUS

## 2015-08-02 MED ORDER — BUTAMBEN-TETRACAINE-BENZOCAINE 2-2-14 % EX AERO
INHALATION_SPRAY | CUTANEOUS | Status: AC
  Administered 2015-08-02: 1 via TOPICAL
  Filled 2015-08-02: qty 20

## 2015-08-02 MED ORDER — LIDOCAINE HCL 2 % EX GEL
CUTANEOUS | Status: AC
  Administered 2015-08-02: 3
  Filled 2015-08-02: qty 5

## 2015-08-02 MED ORDER — BUTAMBEN-TETRACAINE-BENZOCAINE 2-2-14 % EX AERO
1.0000 | INHALATION_SPRAY | Freq: Once | CUTANEOUS | Status: AC
  Administered 2015-08-02: 1 via TOPICAL

## 2015-08-02 MED ORDER — LIDOCAINE HCL 2 % EX GEL
1.0000 "application " | Freq: Once | CUTANEOUS | Status: AC
  Administered 2015-08-02: 3

## 2015-08-02 SURGICAL SUPPLY — 2 items
FACESHIELD LNG OPTICON STERILE (SAFETY) IMPLANT
GLOVE BIO SURGEON STRL SZ8 (GLOVE) ×4 IMPLANT

## 2015-08-03 ENCOUNTER — Encounter: Payer: Self-pay | Admitting: Internal Medicine

## 2015-08-04 LAB — URINE CULTURE: SPECIAL REQUESTS: NORMAL

## 2015-08-07 ENCOUNTER — Encounter: Payer: Self-pay | Admitting: Gastroenterology

## 2015-08-07 ENCOUNTER — Encounter: Payer: Self-pay | Admitting: Internal Medicine

## 2015-08-10 ENCOUNTER — Telehealth: Payer: Self-pay | Admitting: Internal Medicine

## 2015-08-10 NOTE — Telephone Encounter (Signed)
Pt's husband dropped of wifes paper to be filled out for a handicap sign. Please call pt when ready to pick up. Paper is out front.  Thank you, kp

## 2015-08-10 NOTE — Telephone Encounter (Signed)
placed in blue folder

## 2015-08-10 NOTE — Telephone Encounter (Signed)
Handicapped form completed and placed in your box.

## 2015-08-10 NOTE — Telephone Encounter (Signed)
Please advise?

## 2015-08-11 NOTE — Telephone Encounter (Signed)
Pt notified & form mailed at her request

## 2015-08-15 ENCOUNTER — Ambulatory Visit (INDEPENDENT_AMBULATORY_CARE_PROVIDER_SITE_OTHER): Payer: PPO | Admitting: Pulmonary Disease

## 2015-08-15 ENCOUNTER — Encounter: Payer: Self-pay | Admitting: Pulmonary Disease

## 2015-08-15 VITALS — BP 132/70 | HR 61 | Ht 65.0 in | Wt 210.0 lb

## 2015-08-15 DIAGNOSIS — I272 Other secondary pulmonary hypertension: Secondary | ICD-10-CM

## 2015-08-15 DIAGNOSIS — G4733 Obstructive sleep apnea (adult) (pediatric): Secondary | ICD-10-CM

## 2015-08-15 NOTE — Assessment & Plan Note (Signed)
As stated previously, she has WHO group 3 and group 2 pulmonary hypertension which does not benefit from the addition of pulmonary vasodilators. The mainstay of treatment for her this point should be a with weight loss, deconditioning, and treatment of her obstructive sleep apnea.  Plan: Continue CPAP for obstructive sleep apnea

## 2015-08-15 NOTE — Progress Notes (Signed)
Subjective:    Patient ID: Kimberly Roy, female    DOB: [birth year], 81 y.o.   MRN: [id number]  Synopsis: Kimberly Roy first saw the [facility] pulmonary clinic in November of 2013 for evaluation of pulmonary hypertension. In November 2012 she had a left heart cath and right heart cath. Her left heart catheterization showed no obstructive coronary disease the right heart cath showed a mean PA pressure of 35. This is associated with a cardiac index of 2.99 as well as a wedge pressure of 21. An echocardiogram at that time showed an LVEF of 40-45% and right ventricular dilation. She had used Adcirca but this did not improve her symptoms. She noted symptoms consistent with obstructive sleep apnea and never had a polysomnogram.  HPI  Chief Complaint  Patient presents with  . Follow-up    pt c/o sore throat since this morning.  pt tolerating cpap well.      Kimberly Roy says that she has een doing OK.  She has been told that she has an esophageal problem and apparently has an achalasia type problem in [place].  She has been referred to a specialist at [facility] for this, she thinks.  She has undergone endoscopy twice.   She says that her breathing has been OK.  No dyspnea with her level of activity.  She has a sore throat right now.    She has been using the CPAP machine regularly and is benefiting from it.     Past Medical History  Diagnosis Date  . Hypertension   . Hyperlipidemia   . Hypothyroidism   . Anemia   . Depression   . Anxiety   . Nephrolithiasis   . Interstitial cystitis   . Pulmonary nodules     followed by Dr Raul Del  . Fatty liver   . Renal cyst     right  . Headache   . DVT (deep venous thrombosis) ([place])   . Fatty liver   . Lymphedema   . Chest pain   . Dyspnea   . Left ventricular dysfunction   . Pulmonary hypertension ([place])   . CHF (congestive heart failure) ([place])   . Constipation   . Dysphonia   . Pure hypercholesterolemia   . Hyperpiesia   . Obstructive sleep  apnea   . Osteoarthritis     knees/cervical and lumbar spine  . DDD (degenerative disc disease), cervical     Review of Systems  Constitutional: Positive for fatigue. Negative for fever and chills.  HENT: Negative for congestion, postnasal drip, rhinorrhea and sinus pressure.   Respiratory: Negative for cough, shortness of breath and wheezing.   Cardiovascular: Positive for leg swelling. Negative for chest pain and palpitations.       Objective:   Physical Exam  Filed Vitals:   08/15/15 1114  BP: 132/70  Pulse: 61  Height: _0  (1.651 m)  Weight: 210 lb (95.255 kg)  SpO2: 97%  RA  Gen: obese, chronically ill appearing, no acute distress HEENT: NCAT,  OP clear PULM: CTA B CV: RRR, systolic murmur RUSB, no JVD GI: BS+, soft, nontender, no hsm MSK: trace ankle edema, no clubbing, no cyanosis Derm: No no rash or skin breakdown  03/2012 Full PFT >> Ratio 82%, FEV 1.45 L (82%), TLC 2.99L (72% pred) DLCO 53% pred 07/2011 LHC >> normal coronary anatomy 07/2011 RHC >> Wedge 21; PA mean 35, 2.99 woods units; CO 4.6 L/min, CI 2.3 L/min/m2 07/2011 TTE >> mild RV dilation, LVEF 40-45%  12/29/2012 EKG > poor study due to TENS unit, no clear ST wave changes     Assessment & Plan:   Pulmonary hypertension ([place]) As stated previously, she has WHO group 3 and group 2 pulmonary hypertension which does not benefit from the addition of pulmonary vasodilators. The mainstay of treatment for her this point should be a with weight loss, deconditioning, and treatment of her obstructive sleep apnea.  Plan: Continue CPAP for obstructive sleep apnea  OSA (obstructive sleep apnea) She remains compliant with her CPAP therapy for obstructive sleep apnea. We will request a download compliance report for insurance purposes.    Updated Medication List Outpatient Encounter Prescriptions as of 08/15/2015  Medication Sig  . aluminum-magnesium hydroxide-simethicone (MAALOX) [phone] MG/5ML SUSP  Take by mouth.  Marland Kitchen amLODipine (NORVASC) 5 MG tablet Take 1 tablet (5 mg total) by mouth daily.  Marland Kitchen atorvastatin (LIPITOR) 20 MG tablet TAKE ONE TABLET AT BEDTIME  . benazepril (LOTENSIN) 40 MG tablet TAKE ONE (1) TABLET EACH DAY  . Calcium-Magnesium-Vitamin D 600-40-500 MG-MG-UNIT TB24 Take 630 mg by mouth 4 (four) times daily.  [name] Sodium 30-100 MG CAPS Take by mouth.  . Cholecalciferol (VITAMIN D3) 1000 UNITS CAPS Take by mouth.  . dexlansoprazole (DEXILANT) 60 MG capsule Take 60 mg by mouth daily.  Marland Kitchen dicyclomine (BENTYL) 10 MG capsule Take 10 mg by mouth 4 (four) times daily -  before meals and at bedtime.  . DULoxetine (CYMBALTA) 60 MG capsule Take 60 mg by mouth daily.  . fluticasone (FLONASE) 50 MCG/ACT nasal spray Place 2 sprays into the nose daily.  . furosemide (LASIX) 20 MG tablet TAKE ONE (1) TABLET EACH DAY AS NEEDED  . gabapentin (NEURONTIN) 300 MG capsule Take 300 mg by mouth 3 (three) times daily.  . hydrochlorothiazide (HYDRODIURIL) 25 MG tablet TAKE ONE (1) TABLET EACH DAY  . HYDROcodone-acetaminophen (NORCO) 10-325 MG per tablet Take 1 tablet by mouth every 4 (four) hours as needed.  . Inulin (FIBER CHOICE PO) Take 2 tablets by mouth daily. Chew tablets  . magnesium oxide (MAG-OX) 400 MG tablet Take 1 tablet (400 mg total) by mouth daily.  . metoprolol succinate (TOPROL-XL) 100 MG 24 hr tablet TAKE ONE TABLET TWICE DAILY. TAKE WITH OR IMMEDIATELY FOLLOWING A MEAL  . morphine (MS CONTIN) 60 MG 12 hr tablet Take 60 mg by mouth every 8 (eight) hours.  . Multiple Vitamins-Minerals (CENTRUM SILVER ADULT 50+ PO) Take by mouth daily.  Marland Kitchen nystatin cream (MYCOSTATIN) Apply topically 2 (two) times daily.  . ondansetron (ZOFRAN-ODT) 4 MG disintegrating tablet Take 4 mg by mouth every 8 (eight) hours as needed for nausea or vomiting.  . pantoprazole (PROTONIX) 40 MG tablet Take 40 mg by mouth 2 (two) times daily.  . polyethylene glycol powder (GLYCOLAX/MIRALAX) powder  MIX 17 GRAMS (AS MARKED IN BOTTLE TOP) IN 8 OUNCES OF WATER, MIX AND DRINK ONCEA DAY AS DIRECTED.  . Probiotic Product (ALIGN PO) Take by mouth daily.  . Simethicone (GAS-X PO) Take by mouth.  . sodium chloride (OCEAN) 0.65 % nasal spray Place 1 spray into the nose as needed.  Marland Kitchen SYNTHROID 112 MCG tablet TAKE ONE (1) TABLET EACH DAY  . [DISCONTINUED] ciprofloxacin (CIPRO) 500 MG tablet Take 1 tablet (500 mg total) by mouth 2 (two) times daily. (Patient not taking: Reported on 08/15/2015)  . [DISCONTINUED] doxycycline (VIBRA-TABS) 100 MG tablet Take 1 tablet (100 mg total) by mouth 2 (two) times daily. (Patient not taking: Reported on 08/15/2015)  . [  DISCONTINUED] hydrOXYzine (ATARAX/VISTARIL) 25 MG tablet Take 25 mg by mouth at bedtime.  . [DISCONTINUED] phenazopyridine (PYRIDIUM) 200 MG tablet Take 1 tablet (200 mg total) by mouth 3 (three) times daily as needed for pain. (Patient not taking: Reported on 08/15/2015)   No facility-administered encounter medications on file as of 08/15/2015.

## 2015-08-15 NOTE — Assessment & Plan Note (Signed)
She remains compliant with her CPAP therapy for obstructive sleep apnea. We will request a download compliance report for insurance purposes.

## 2015-08-18 ENCOUNTER — Emergency Department: Payer: PPO

## 2015-08-18 ENCOUNTER — Emergency Department
Admission: EM | Admit: 2015-08-18 | Discharge: 2015-08-18 | Disposition: A | Discharge status: Home / Self Care | Payer: PPO | Attending: Emergency Medicine | Admitting: Emergency Medicine

## 2015-08-18 ENCOUNTER — Encounter: Payer: Self-pay | Admitting: Emergency Medicine

## 2015-08-18 ENCOUNTER — Other Ambulatory Visit: Payer: Self-pay | Admitting: Neurology

## 2015-08-18 ENCOUNTER — Ambulatory Visit: Payer: PPO

## 2015-08-18 DIAGNOSIS — Y9301 Activity, walking, marching and hiking: Secondary | ICD-10-CM | POA: Diagnosis not present

## 2015-08-18 DIAGNOSIS — Y998 Other external cause status: Secondary | ICD-10-CM | POA: Insufficient documentation

## 2015-08-18 DIAGNOSIS — S0990XA Unspecified injury of head, initial encounter: Secondary | ICD-10-CM | POA: Diagnosis present

## 2015-08-18 DIAGNOSIS — Y92009 Unspecified place in unspecified non-institutional (private) residence as the place of occurrence of the external cause: Secondary | ICD-10-CM | POA: Insufficient documentation

## 2015-08-18 DIAGNOSIS — Z79899 Other long term (current) drug therapy: Secondary | ICD-10-CM | POA: Diagnosis not present

## 2015-08-18 DIAGNOSIS — W19XXXA Unspecified fall, initial encounter: Secondary | ICD-10-CM

## 2015-08-18 DIAGNOSIS — W01198A Fall on same level from slipping, tripping and stumbling with subsequent striking against other object, initial encounter: Secondary | ICD-10-CM | POA: Diagnosis not present

## 2015-08-18 DIAGNOSIS — I10 Essential (primary) hypertension: Secondary | ICD-10-CM | POA: Diagnosis not present

## 2015-08-18 NOTE — ED Notes (Signed)
Patient presents to the ED post fall at home around 1pm.  Patient states she was walking with her walker and she thinks she took a wrong step and states, "all the sudden I knew I was going to fall and I tried to signal to my husband to catch me but he didn't get the message."  Patient states she did not pass out but she did hit her head.  Patient has swelling to the right side of her head and bruising to the right side of her forehead.  Patient states she hit her head on a wooden cabinet.  Patient denies taking blood thinners.

## 2015-08-18 NOTE — ED Provider Notes (Signed)
[facility] Emergency Department Provider Note  ____________________________________________  Time seen: Approximately 7:30 PM  I have reviewed the triage vital signs and the nursing notes.   HISTORY  Chief Complaint Fall   HPI Kimberly Roy is a 81 y.o. female reports she was walking with her walker when she tripped and fell. She did not pass out she did hit her head. The time I saw her she had no further headache no neck pain. She notes she has spinal stenosis and has had neck surgery. Complaint at the present time is that she is hungry.   Past Medical History  Diagnosis Date  . Hypertension   . Hyperlipidemia   . Hypothyroidism   . Anemia   . Depression   . Anxiety   . Nephrolithiasis   . Interstitial cystitis   . Pulmonary nodules     followed by Dr Raul Del  . Fatty liver   . Renal cyst     right  . Headache   . DVT (deep venous thrombosis) ([place])   . Fatty liver   . Lymphedema   . Chest pain   . Dyspnea   . Left ventricular dysfunction   . Pulmonary hypertension ([place])   . CHF (congestive heart failure) ([place])   . Constipation   . Dysphonia   . Pure hypercholesterolemia   . Hyperpiesia   . Obstructive sleep apnea   . Osteoarthritis     knees/cervical and lumbar spine  . DDD (degenerative disc disease), cervical     Patient Active Problem List   Diagnosis Date Noted  . Excessive sweating 07/30/2015  . Cellulitis of leg, right 07/16/2015  . Headache 07/16/2015  . Groin pain 05/14/2015  . Muscle twitching 03/05/2015  . Leg cramps 02/11/2015  . Back pain 02/11/2015  . Abdominal pain 02/11/2015  . Acute cystitis without hematuria 12/12/2014  . Left elbow pain 11/30/2014  . Health care maintenance 11/30/2014  . Osteoporosis 10/19/2014  . Rectal bleeding 10/19/2014  . Obesity (BMI 30-39.9) 09/03/2014  . Neck pain 09/03/2014  . Unsteady gait 09/03/2014  . Nocturia 09/03/2014  . Dysphagia 06/01/2014  . Nasal dryness 06/01/2014  .  Stress 06/01/2014  . Obesity 02/27/2014  . Fatigue 02/27/2014  . Pre-op evaluation 10/12/2013  . Hoarseness 08/14/2013  . Leg swelling 01/24/2013  . CHF (congestive heart failure) ([place]) 12/29/2012  . Cough 12/29/2012  . Chest pain 12/29/2012  . OSA (obstructive sleep apnea) 07/09/2012  . Osteoarthritis 07/04/2012  . Anemia 07/04/2012  . Chronic constipation 07/04/2012  . Pulmonary hypertension ([place]) 07/04/2012  . Pulmonary nodules 07/04/2012  . Hypertension 07/04/2012  . Hypercholesterolemia 07/04/2012  . Hypothyroidism 07/04/2012    Past Surgical History  Procedure Laterality Date  . Breast reduction surgery      3/99  . Back surgeries    . Appendectomy    . Cervical spine surgery    . Tonsilectomy/adenoidectomy with myringotomy    . Excisional hemorrhoidectomy    . Rotator cuff surgery      blilateral  . Knee arthroscopy      left and right  . Abdominal hysterectomy      ovaries left in place  . Cataracts Bilateral   . Hemorrhoid surgery    . Esophagogastroduodenoscopy N/A 02/27/2015    Procedure: ESOPHAGOGASTRODUODENOSCOPY (EGD);  Surgeon: Hulen Luster, MD;  Location: [facility] ENDOSCOPY;  Service: Gastroenterology;  Laterality: N/A;  . Hip surgery  2013    Right hip surgery  . Replacement total  knee Bilateral   . Cardiac catheterization    . Esophageal manometry N/A 08/02/2015    Procedure: ESOPHAGEAL MANOMETRY (EM);  Surgeon: Josefine Class, MD;  Location: [facility] ENDOSCOPY;  Service: Endoscopy;  Laterality: N/A;    Current Outpatient Rx  Name  Route  Sig  Dispense  Refill  . aluminum-magnesium hydroxide-simethicone (MAALOX) [phone] MG/5ML SUSP   Oral   Take by mouth.         Marland Kitchen amLODipine (NORVASC) 5 MG tablet   Oral   Take 1 tablet (5 mg total) by mouth daily.   90 tablet   1   . atorvastatin (LIPITOR) 20 MG tablet      TAKE ONE TABLET AT BEDTIME   90 tablet   1   . benazepril (LOTENSIN) 40 MG tablet      TAKE ONE (1) TABLET EACH DAY   90  tablet   1   . Calcium-Magnesium-Vitamin D 600-40-500 MG-MG-UNIT TB24   Oral   Take 630 mg by mouth 4 (four) times daily.         [name] Sodium 30-100 MG CAPS   Oral   Take by mouth.         . Cholecalciferol (VITAMIN D3) 1000 UNITS CAPS   Oral   Take by mouth.         . dexlansoprazole (DEXILANT) 60 MG capsule   Oral   Take 60 mg by mouth daily.         Marland Kitchen dicyclomine (BENTYL) 10 MG capsule   Oral   Take 10 mg by mouth 4 (four) times daily -  before meals and at bedtime.         . DULoxetine (CYMBALTA) 60 MG capsule   Oral   Take 60 mg by mouth daily.         . fluticasone (FLONASE) 50 MCG/ACT nasal spray   Nasal   Place 2 sprays into the nose daily.         . furosemide (LASIX) 20 MG tablet      TAKE ONE (1) TABLET EACH DAY AS NEEDED   30 tablet   3   . gabapentin (NEURONTIN) 300 MG capsule   Oral   Take 300 mg by mouth 3 (three) times daily.         . hydrochlorothiazide (HYDRODIURIL) 25 MG tablet      TAKE ONE (1) TABLET EACH DAY   90 tablet   1   . HYDROcodone-acetaminophen (NORCO) 10-325 MG per tablet   Oral   Take 1 tablet by mouth every 4 (four) hours as needed.         . Inulin (FIBER CHOICE PO)   Oral   Take 2 tablets by mouth daily. Chew tablets         . magnesium oxide (MAG-OX) 400 MG tablet   Oral   Take 1 tablet (400 mg total) by mouth daily.   30 tablet   1   . metoprolol succinate (TOPROL-XL) 100 MG 24 hr tablet      TAKE ONE TABLET TWICE DAILY. TAKE WITH OR IMMEDIATELY FOLLOWING A MEAL   180 tablet   3   . morphine (MS CONTIN) 60 MG 12 hr tablet   Oral   Take 60 mg by mouth every 8 (eight) hours.         . Multiple Vitamins-Minerals (CENTRUM SILVER ADULT 50+ PO)   Oral   Take by mouth daily.         Marland Kitchen  nystatin cream (MYCOSTATIN)   Topical   Apply topically 2 (two) times daily.   30 g   1   . ondansetron (ZOFRAN-ODT) 4 MG disintegrating tablet   Oral   Take 4 mg by mouth every 8  (eight) hours as needed for nausea or vomiting.         . pantoprazole (PROTONIX) 40 MG tablet   Oral   Take 40 mg by mouth 2 (two) times daily.         . polyethylene glycol powder (GLYCOLAX/MIRALAX) powder      MIX 17 GRAMS (AS MARKED IN BOTTLE TOP) IN 8 OUNCES OF WATER, MIX AND DRINK ONCEA DAY AS DIRECTED.   527 g   2   . Probiotic Product (ALIGN PO)   Oral   Take by mouth daily.         . Simethicone (GAS-X PO)   Oral   Take by mouth.         . sodium chloride (OCEAN) 0.65 % nasal spray   Nasal   Place 1 spray into the nose as needed.         Marland Kitchen SYNTHROID 112 MCG tablet      TAKE ONE (1) TABLET EACH DAY   90 tablet   1     Dispense as written.     Allergies Atarax; Biaxin; Levaquin; Lyrica; Nucynta er; Sertraline; Sulfa antibiotics; and Zoloft  Family History  Problem Relation Age of Onset  . Heart disease Mother   . Stroke Mother   . Heart disease Father     myocardial infarction age 92  . Hypertension Mother     Social History Social History  Substance Use Topics  . Smoking status: Never Smoker   . Smokeless tobacco: Never Used  . Alcohol Use: No    Review of Systems Constitutional: No fever/chills Eyes: No visual changes. ENT: No sore throat. Cardiovascular: Denies chest pain. Respiratory: Denies shortness of breath. Gastrointestinal: No abdominal pain.  No nausea, no vomiting.  No diarrhea.  No constipation. Genitourinary: Negative for dysuria. Musculoskeletal: Negative for back pain. Skin: Negative for rash. Neurological: Negative for headaches, focal weakness or numbness.  10-point ROS otherwise negative.  ____________________________________________   PHYSICAL EXAM:  VITAL SIGNS: ED Triage Vitals  Enc Vitals Group     BP 08/18/15 1629 175/67 mmHg     Pulse Rate 08/18/15 1629 55     Resp 08/18/15 1629 16     Temp 08/18/15 1629 98.7 F (37.1 C)     Temp Source 08/18/15 1629 Oral     SpO2 08/18/15 1629 99 %     Weight  08/18/15 1629 210 lb (95.255 kg)     Height 08/18/15 1629 _0  (1.575 m)     Head Cir --      Peak Flow --      Pain Score 08/18/15 1630 0     Pain Loc --      Pain Edu? --      Excl. in [place]? --     Constitutional: Alert and oriented. Well appearing and in no acute distress. Eyes: Conjunctivae are normal. PERRL. EOMI. Head: Atraumatic separate small lump in the right occipital area. Nose: No congestion/rhinnorhea. Mouth/Throat: Mucous membranes are moist.  Oropharynx non-erythematous. Neck: No stridor.  No cervical spine tenderness to palpation. Cardiovascular: Normal rate, regular rhythm. Grossly normal heart sounds.  Good peripheral circulation. Respiratory: Normal respiratory effort.  No retractions. Lungs CTAB. Gastrointestinal: Soft and nontender. No  distention. No abdominal bruits. No CVA tenderness. Musculoskeletal: No lower extremity tenderness nor edema.  No joint effusions. Neurologic:  Normal speech and language. No gross focal neurologic deficits are appreciated.  Skin:  Skin is warm, dry and intact. No rash noted. Psychiatric: Mood and affect are normal. Speech and behavior are normal.  ____________________________________________   LABS (all labs ordered are listed, but only abnormal results are displayed)  Labs Reviewed - No data to display ____________________________________________  EKG   ____________________________________________  RADIOLOGY  CT of the head and neck show no acute pathology there is dense atherosclerosis noted and spinal stenosis both of which I told the patient about. ____________________________________________   PROCEDURES    ____________________________________________   INITIAL IMPRESSION / ASSESSMENT AND PLAN / ED COURSE  Pertinent labs & imaging results that were available during my care of the patient were reviewed by me and considered in my medical decision making (see chart for  details).   ____________________________________________   FINAL CLINICAL IMPRESSION(S) / ED DIAGNOSES  Final diagnoses:  Fall, initial encounter      Nena Polio, MD 08/18/15 2156

## 2015-08-21 ENCOUNTER — Encounter: Payer: Self-pay | Admitting: *Deleted

## 2015-08-21 ENCOUNTER — Emergency Department: Payer: PPO

## 2015-08-21 ENCOUNTER — Emergency Department
Admission: EM | Admit: 2015-08-21 | Discharge: 2015-08-21 | Disposition: A | Discharge status: Home / Self Care | Payer: PPO | Attending: Student | Admitting: Student

## 2015-08-21 DIAGNOSIS — I10 Essential (primary) hypertension: Secondary | ICD-10-CM | POA: Insufficient documentation

## 2015-08-21 DIAGNOSIS — Z79891 Long term (current) use of opiate analgesic: Secondary | ICD-10-CM | POA: Diagnosis not present

## 2015-08-21 DIAGNOSIS — S29001A Unspecified injury of muscle and tendon of front wall of thorax, initial encounter: Secondary | ICD-10-CM | POA: Insufficient documentation

## 2015-08-21 DIAGNOSIS — N390 Urinary tract infection, site not specified: Secondary | ICD-10-CM | POA: Insufficient documentation

## 2015-08-21 DIAGNOSIS — Z7951 Long term (current) use of inhaled steroids: Secondary | ICD-10-CM | POA: Insufficient documentation

## 2015-08-21 DIAGNOSIS — W01198A Fall on same level from slipping, tripping and stumbling with subsequent striking against other object, initial encounter: Secondary | ICD-10-CM | POA: Insufficient documentation

## 2015-08-21 DIAGNOSIS — R0781 Pleurodynia: Secondary | ICD-10-CM

## 2015-08-21 DIAGNOSIS — Y9301 Activity, walking, marching and hiking: Secondary | ICD-10-CM | POA: Diagnosis not present

## 2015-08-21 DIAGNOSIS — Y9289 Other specified places as the place of occurrence of the external cause: Secondary | ICD-10-CM | POA: Insufficient documentation

## 2015-08-21 DIAGNOSIS — Y998 Other external cause status: Secondary | ICD-10-CM | POA: Insufficient documentation

## 2015-08-21 DIAGNOSIS — Z79899 Other long term (current) drug therapy: Secondary | ICD-10-CM | POA: Diagnosis not present

## 2015-08-21 LAB — URINALYSIS COMPLETE WITH MICROSCOPIC (ARMC ONLY)
BILIRUBIN URINE: NEGATIVE
GLUCOSE, UA: NEGATIVE mg/dL
Hgb urine dipstick: NEGATIVE
KETONES UR: NEGATIVE mg/dL
LEUKOCYTES UA: NEGATIVE
Nitrite: POSITIVE — AB
PH: 6 (ref 5.0–8.0)
Protein, ur: NEGATIVE mg/dL
Specific Gravity, Urine: 1.018 (ref 1.005–1.030)

## 2015-08-21 MED ORDER — CEPHALEXIN 500 MG PO CAPS: 500.0000 mg | ORAL_CAPSULE | Freq: Four times a day (QID) | ORAL | Status: DC

## 2015-08-21 MED ORDER — OXYCODONE HCL 5 MG PO TABS: 5.0000 mg | ORAL_TABLET | Freq: Four times a day (QID) | ORAL | Status: DC | PRN

## 2015-08-21 NOTE — ED Provider Notes (Signed)
[facility] Emergency Department Provider Note  ____________________________________________  Time seen: Approximately 2:10 PM  I have reviewed the triage vital signs and the nursing notes.   HISTORY  Chief Complaint Rib Injury    HPI Kimberly Roy is a 81 y.o. female with hypertension, hyperlipidemia, CHF, not chronically anticoagulated who presents for evaluation of 3 days constant right chest pain, gradual onset, worse with movement and deep inspiration, currently moderate. Patient reports that on 08/18/2015, she was walking with her walker when she tripped and fell, hitting her head. She was seen in this ER and had a negative CT scan of her head. She reports that she was having mild pain in the right chest after the fall however it wasn't terribly concerning to her. Over the past few days it has become more intense. She denies any shortness of breath. No sudden sweating, pain is not worse with exertion, no nausea or vomiting. No fevers. She has prior history of DVT that presented with leg swelling however no history of PE, she is not chronically anticoagulated. She denies any leg swelling. She is having painful urination and has a history of recurrent urinary tract infections.   Past Medical History  Diagnosis Date  . Hypertension   . Hyperlipidemia   . Hypothyroidism   . Anemia   . Depression   . Anxiety   . Nephrolithiasis   . Interstitial cystitis   . Pulmonary nodules     followed by Dr Raul Del  . Fatty liver   . Renal cyst     right  . Headache   . DVT (deep venous thrombosis) ([place])   . Fatty liver   . Lymphedema   . Chest pain   . Dyspnea   . Left ventricular dysfunction   . Pulmonary hypertension ([place])   . CHF (congestive heart failure) ([place])   . Constipation   . Dysphonia   . Pure hypercholesterolemia   . Hyperpiesia   . Obstructive sleep apnea   . Osteoarthritis     knees/cervical and lumbar spine  . DDD (degenerative disc disease),  cervical     Patient Active Problem List   Diagnosis Date Noted  . Excessive sweating 07/30/2015  . Cellulitis of leg, right 07/16/2015  . Headache 07/16/2015  . Groin pain 05/14/2015  . Muscle twitching 03/05/2015  . Leg cramps 02/11/2015  . Back pain 02/11/2015  . Abdominal pain 02/11/2015  . Acute cystitis without hematuria 12/12/2014  . Left elbow pain 11/30/2014  . Health care maintenance 11/30/2014  . Osteoporosis 10/19/2014  . Rectal bleeding 10/19/2014  . Obesity (BMI 30-39.9) 09/03/2014  . Neck pain 09/03/2014  . Unsteady gait 09/03/2014  . Nocturia 09/03/2014  . Dysphagia 06/01/2014  . Nasal dryness 06/01/2014  . Stress 06/01/2014  . Obesity 02/27/2014  . Fatigue 02/27/2014  . Pre-op evaluation 10/12/2013  . Hoarseness 08/14/2013  . Leg swelling 01/24/2013  . CHF (congestive heart failure) ([place]) 12/29/2012  . Cough 12/29/2012  . Chest pain 12/29/2012  . OSA (obstructive sleep apnea) 07/09/2012  . Osteoarthritis 07/04/2012  . Anemia 07/04/2012  . Chronic constipation 07/04/2012  . Pulmonary hypertension ([place]) 07/04/2012  . Pulmonary nodules 07/04/2012  . Hypertension 07/04/2012  . Hypercholesterolemia 07/04/2012  . Hypothyroidism 07/04/2012    Past Surgical History  Procedure Laterality Date  . Breast reduction surgery      3/99  . Back surgeries    . Appendectomy    . Cervical spine surgery    .  Tonsilectomy/adenoidectomy with myringotomy    . Excisional hemorrhoidectomy    . Rotator cuff surgery      blilateral  . Knee arthroscopy      left and right  . Abdominal hysterectomy      ovaries left in place  . Cataracts Bilateral   . Hemorrhoid surgery    . Esophagogastroduodenoscopy N/A 02/27/2015    Procedure: ESOPHAGOGASTRODUODENOSCOPY (EGD);  Surgeon: Hulen Luster, MD;  Location: [facility] ENDOSCOPY;  Service: Gastroenterology;  Laterality: N/A;  . Hip surgery  2013    Right hip surgery  . Replacement total knee Bilateral   . Cardiac catheterization     . Esophageal manometry N/A 08/02/2015    Procedure: ESOPHAGEAL MANOMETRY (EM);  Surgeon: Josefine Class, MD;  Location: [facility] ENDOSCOPY;  Service: Endoscopy;  Laterality: N/A;    Current Outpatient Rx  Name  Route  Sig  Dispense  Refill  . aluminum-magnesium hydroxide-simethicone (MAALOX) 417-408-14 MG/5ML SUSP   Oral   Take by mouth.         Marland Kitchen amLODipine (NORVASC) 5 MG tablet   Oral   Take 1 tablet (5 mg total) by mouth daily.   90 tablet   1   . atorvastatin (LIPITOR) 20 MG tablet      TAKE ONE TABLET AT BEDTIME   90 tablet   1   . benazepril (LOTENSIN) 40 MG tablet      TAKE ONE (1) TABLET EACH DAY   90 tablet   1   . Calcium-Magnesium-Vitamin D 600-40-500 MG-MG-UNIT TB24   Oral   Take 630 mg by mouth 4 (four) times daily.         [name] Sodium 30-100 MG CAPS   Oral   Take by mouth.         . Cholecalciferol (VITAMIN D3) 1000 UNITS CAPS   Oral   Take by mouth.         . dexlansoprazole (DEXILANT) 60 MG capsule   Oral   Take 60 mg by mouth daily.         Marland Kitchen dicyclomine (BENTYL) 10 MG capsule   Oral   Take 10 mg by mouth 4 (four) times daily -  before meals and at bedtime.         . DULoxetine (CYMBALTA) 60 MG capsule   Oral   Take 60 mg by mouth daily.         . fluticasone (FLONASE) 50 MCG/ACT nasal spray   Nasal   Place 2 sprays into the nose daily.         . furosemide (LASIX) 20 MG tablet      TAKE ONE (1) TABLET EACH DAY AS NEEDED   30 tablet   3   . gabapentin (NEURONTIN) 300 MG capsule   Oral   Take 300 mg by mouth 3 (three) times daily.         . hydrochlorothiazide (HYDRODIURIL) 25 MG tablet      TAKE ONE (1) TABLET EACH DAY   90 tablet   1   . HYDROcodone-acetaminophen (NORCO) 10-325 MG per tablet   Oral   Take 1 tablet by mouth every 4 (four) hours as needed.         . Inulin (FIBER CHOICE PO)   Oral   Take 2 tablets by mouth daily. Chew tablets         . magnesium oxide (MAG-OX)  400 MG tablet   Oral   Take 1 tablet (  400 mg total) by mouth daily.   30 tablet   1   . metoprolol succinate (TOPROL-XL) 100 MG 24 hr tablet      TAKE ONE TABLET TWICE DAILY. TAKE WITH OR IMMEDIATELY FOLLOWING A MEAL   180 tablet   3   . morphine (MS CONTIN) 60 MG 12 hr tablet   Oral   Take 60 mg by mouth every 8 (eight) hours.         . Multiple Vitamins-Minerals (CENTRUM SILVER ADULT 50+ PO)   Oral   Take by mouth daily.         Marland Kitchen nystatin cream (MYCOSTATIN)   Topical   Apply topically 2 (two) times daily.   30 g   1   . ondansetron (ZOFRAN-ODT) 4 MG disintegrating tablet   Oral   Take 4 mg by mouth every 8 (eight) hours as needed for nausea or vomiting.         . pantoprazole (PROTONIX) 40 MG tablet   Oral   Take 40 mg by mouth 2 (two) times daily.         . polyethylene glycol powder (GLYCOLAX/MIRALAX) powder      MIX 17 GRAMS (AS MARKED IN BOTTLE TOP) IN 8 OUNCES OF WATER, MIX AND DRINK ONCEA DAY AS DIRECTED.   527 g   2   . Probiotic Product (ALIGN PO)   Oral   Take by mouth daily.         . Simethicone (GAS-X PO)   Oral   Take by mouth.         . sodium chloride (OCEAN) 0.65 % nasal spray   Nasal   Place 1 spray into the nose as needed.         Marland Kitchen SYNTHROID 112 MCG tablet      TAKE ONE (1) TABLET EACH DAY   90 tablet   1     Dispense as written.     Allergies Atarax; Biaxin; Levaquin; Lyrica; Nucynta er; Sertraline; Sulfa antibiotics; and Zoloft  Family History  Problem Relation Age of Onset  . Heart disease Mother   . Stroke Mother   . Heart disease Father     myocardial infarction age 64  . Hypertension Mother     Social History Social History  Substance Use Topics  . Smoking status: Never Smoker   . Smokeless tobacco: Never Used  . Alcohol Use: No    Review of Systems Constitutional: No fever/chills Eyes: No visual changes. ENT: No sore throat. Cardiovascular: + chest pain. Respiratory: Denies shortness of  breath. Gastrointestinal: No abdominal pain.  No nausea, no vomiting.  No diarrhea.  No constipation. Genitourinary: + for dysuria. Musculoskeletal: Negative for back pain. Skin: Negative for rash. Neurological: Negative for headaches, focal weakness or numbness.  10-point ROS otherwise negative.  ____________________________________________   PHYSICAL EXAM:  VITAL SIGNS: ED Triage Vitals  Enc Vitals Group     BP 08/21/15 1238 151/55 mmHg     Pulse Rate 08/21/15 1238 61     Resp 08/21/15 1238 18     Temp 08/21/15 1238 98.4 F (36.9 C)     Temp Source 08/21/15 1238 Oral     SpO2 08/21/15 1238 97 %     Weight 08/21/15 1238 206 lb (93.441 kg)     Height 08/21/15 1238 _0  (1.575 m)     Head Cir --      Peak Flow --      Pain Score  08/21/15 1240 7     Pain Loc --      Pain Edu? --      Excl. in [place]? --     Constitutional: Alert and oriented. Well appearing and in no acute distress. Eyes: Conjunctivae are normal. PERRL. EOMI. Head: Atraumatic. Nose: No congestion/rhinnorhea. Mouth/Throat: Mucous membranes are moist.  Oropharynx non-erythematous. Neck: No stridor.   Cardiovascular: Normal rate, regular rhythm. Grossly normal heart sounds.  Good peripheral circulation. Respiratory: Normal respiratory effort.  No retractions. Lungs CTAB. Gastrointestinal: Soft and nontender. No distention.  No CVA tenderness. Genitourinary: deferred Musculoskeletal: No lower extremity tenderness nor edema.  No joint effusions. Tenderness to palpation throughout the right anterior and lateral chest wall, palpation reproduces her pain as does movement with engagement of the right pectoralis muscles. Neurologic:  Normal speech and language. No gross focal neurologic deficits are appreciated.  Skin:  Skin is warm, dry and intact. No rash noted. Psychiatric: Mood and affect are normal. Speech and behavior are normal.  ____________________________________________   LABS (all labs ordered are  listed, but only abnormal results are displayed)  Labs Reviewed  URINALYSIS COMPLETEWITH MICROSCOPIC ([place] ONLY) - Abnormal; Notable for the following:    Color, Urine AMBER (*)    APPearance CLEAR (*)    Nitrite POSITIVE (*)    Bacteria, UA MANY (*)    Squamous Epithelial / LPF 0-5 (*)    All other components within normal limits  URINE CULTURE   ____________________________________________  EKG  ED ECG REPORT I, Joanne Gavel, the attending physician, personally viewed and interpreted this ECG.   Date: 08/21/2015  EKG Time: 14:21  Rate: 53  Rhythm: sinus bradycardia  Axis: normal  Intervals:right bundle branch block  ST&T Change: No acute ST elevation. T-wave inversion in lead 3.  ____________________________________________  RADIOLOGY  CXR  IMPRESSION: No active cardiopulmonary disease.  ____________________________________________   PROCEDURES  Procedure(s) performed: None  Critical Care performed: No  ____________________________________________   INITIAL IMPRESSION / ASSESSMENT AND PLAN / ED COURSE  Pertinent labs & imaging results that were available during my care of the patient were reviewed by me and considered in my medical decision making (see chart for details).  Kimberly Roy is a 81 y.o. female with hypertension, hyperlipidemia, CHF, not chronically anticoagulated who presents for evaluation of 3 days constant right chest pain, gradual onset, worse with movement and deep inspiration, currently moderate. On exam, she is very well-appearing and in no acute distress. Vital signs are stable, she is afebrile. She has reproducible chest wall tenderness in the right anterolateral chest wall and pain is completely reproduced with movement and I suspect she is having musculoskeletal chest pain. EKG is not consistent with acute ischemia and is essentially unchanged from 2012. Pain ongoing for 3 days, nonexertional. No shortness of breath, no hypoxia, no  tachypnea. Pain is not sudden onset, not ripping or tearing in nature and does not radiate to the back or down towards the feet. Doubt ACS, acute aortic dissection or PE. Chest x-ray clear. No rib fracture, no pneumonia. Urinalysis consistent with nitrite positive urinary tract infection but no fevers, no systemic symptoms. Discussed return precautions, pain control, use of incentive spirometer. We'll DC with close PCP follow-up as well as Keflex for UTI. Patient is comfortable with the discharge plan. ____________________________________________   FINAL CLINICAL IMPRESSION(S) / ED DIAGNOSES  Final diagnoses:  Rib pain on right side  UTI (lower urinary tract infection)      Joanne Gavel,  MD 08/21/15 1505

## 2015-08-21 NOTE — ED Notes (Signed)
Reports falling on Friday and was seen and had ct of head, states later her right ribs started hurting, worse with deep breath.  Skin w/d with good color.

## 2015-08-21 NOTE — ED Notes (Signed)
Pt a/o. C/o right rib pain. No obvious injury. No bruising. BS clear.

## 2015-08-21 NOTE — ED Notes (Signed)
Pt discharged home after verbalizing understanding of discharge instructions; nad noted. 

## 2015-08-21 NOTE — ED Notes (Signed)
Pt instructed on how to use incentive spirometer. Returned demonstration.

## 2015-08-24 LAB — URINE CULTURE: Culture: 50000

## 2015-08-28 ENCOUNTER — Encounter: Payer: Self-pay | Admitting: Internal Medicine

## 2015-08-29 ENCOUNTER — Ambulatory Visit
Admission: RE | Admit: 2015-08-29 | Discharge: 2015-08-29 | Disposition: A | Discharge status: Home / Self Care | Payer: PPO | Source: Ambulatory Visit | Attending: Internal Medicine | Admitting: Internal Medicine

## 2015-08-29 DIAGNOSIS — Z1231 Encounter for screening mammogram for malignant neoplasm of breast: Secondary | ICD-10-CM | POA: Diagnosis present

## 2015-08-29 DIAGNOSIS — Z1239 Encounter for other screening for malignant neoplasm of breast: Secondary | ICD-10-CM

## 2015-08-29 NOTE — Telephone Encounter (Signed)
Please call pt and see if she can come in tomorrow at the spot we were holding.  Thanks.

## 2015-08-30 ENCOUNTER — Encounter: Payer: Self-pay | Admitting: Internal Medicine

## 2015-08-30 ENCOUNTER — Ambulatory Visit (INDEPENDENT_AMBULATORY_CARE_PROVIDER_SITE_OTHER): Payer: PPO | Admitting: Internal Medicine

## 2015-08-30 VITALS — BP 146/72 | HR 60 | Temp 98.0°F | Wt 196.0 lb

## 2015-08-30 DIAGNOSIS — R1310 Dysphagia, unspecified: Secondary | ICD-10-CM

## 2015-08-30 DIAGNOSIS — R399 Unspecified symptoms and signs involving the genitourinary system: Secondary | ICD-10-CM

## 2015-08-30 DIAGNOSIS — I10 Essential (primary) hypertension: Secondary | ICD-10-CM

## 2015-08-30 DIAGNOSIS — N3000 Acute cystitis without hematuria: Secondary | ICD-10-CM

## 2015-08-30 DIAGNOSIS — R252 Cramp and spasm: Secondary | ICD-10-CM

## 2015-08-30 LAB — POCT URINALYSIS DIPSTICK
Bilirubin, UA: NEGATIVE
Glucose, UA: NEGATIVE
Ketones, UA: NEGATIVE
NITRITE UA: NEGATIVE
PH UA: 7
PROTEIN UA: NEGATIVE
RBC UA: NEGATIVE
SPEC GRAV UA: 1.015
UROBILINOGEN UA: 0.2

## 2015-08-30 LAB — BASIC METABOLIC PANEL
BUN: 19 mg/dL (ref 6–23)
CALCIUM: 9.5 mg/dL (ref 8.4–10.5)
CO2: 31 mEq/L (ref 19–32)
Chloride: 100 mEq/L (ref 96–112)
Creatinine, Ser: 0.79 mg/dL (ref 0.40–1.20)
GFR: 73.97 mL/min (ref >60.00)
Glucose, Bld: 81 mg/dL (ref 70–99)
POTASSIUM: 4.9 meq/L (ref 3.5–5.1)
SODIUM: 139 meq/L (ref 135–145)

## 2015-08-30 LAB — MAGNESIUM: Magnesium: 2 mg/dL (ref 1.5–2.5)

## 2015-08-30 MED ORDER — AMOXICILLIN 875 MG PO TABS: 875.0000 mg | ORAL_TABLET | Freq: Two times a day (BID) | ORAL | Status: DC

## 2015-08-30 NOTE — Progress Notes (Signed)
Pre visit review using our clinic review tool, if applicable. No additional management support is needed unless otherwise documented below in the visit note.

## 2015-08-30 NOTE — Progress Notes (Signed)
Patient ID: Kimberly Roy, female   DOB: August 23, 1931, 81 y.o.   MRN: [id number]   Subjective:    Patient ID: Kimberly Roy, female    DOB: [birth year], 80 y.o.   MRN: [id number]  HPI  Patient here as a work in with concerns regarding a urinary tract infection.  She was recently evaluated at Urgent Care.  Found to have UTI (enterococcus).  Was treated with keflex.  She reports no improvement in her symptoms.  Describes a burning sensation with urination.  Some increased frequency.  No hematuria.  States feels like a urinary tract infection.  Does not feel c/w her interstitial cystitis.  Has had some issues with reoccurring urinary tract infections.  Due to see Dr Amalia Hailey 09/25/15.  She is eating.  Some decreased appetite.   Has lost weight.  Ate better over Christmas.  Saw GI.  Diagnosed with achalasia.  Following up with GI and they are scheduling her to see a specialist.     Past Medical History  Diagnosis Date  . Hypertension   . Hyperlipidemia   . Hypothyroidism   . Anemia   . Depression   . Anxiety   . Nephrolithiasis   . Interstitial cystitis   . Pulmonary nodules     followed by Dr Raul Del  . Fatty liver   . Renal cyst     right  . Headache   . DVT (deep venous thrombosis) ([place])   . Fatty liver   . Lymphedema   . Chest pain   . Dyspnea   . Left ventricular dysfunction   . Pulmonary hypertension ([place])   . CHF (congestive heart failure) ([place])   . Constipation   . Dysphonia   . Pure hypercholesterolemia   . Hyperpiesia   . Obstructive sleep apnea   . Osteoarthritis     knees/cervical and lumbar spine  . DDD (degenerative disc disease), cervical    Past Surgical History  Procedure Laterality Date  . Breast reduction surgery      3/99  . Back surgeries    . Appendectomy    . Cervical spine surgery    . Tonsilectomy/adenoidectomy with myringotomy    . Excisional hemorrhoidectomy    . Rotator cuff surgery      blilateral  . Knee arthroscopy      left and right  . Abdominal  hysterectomy      ovaries left in place  . Cataracts Bilateral   . Hemorrhoid surgery    . Esophagogastroduodenoscopy N/A 02/27/2015    Procedure: ESOPHAGOGASTRODUODENOSCOPY (EGD);  Surgeon: Hulen Luster, MD;  Location: [facility] ENDOSCOPY;  Service: Gastroenterology;  Laterality: N/A;  . Hip surgery  2013    Right hip surgery  . Replacement total knee Bilateral   . Cardiac catheterization    . Esophageal manometry N/A 08/02/2015    Procedure: ESOPHAGEAL MANOMETRY (EM);  Surgeon: Josefine Class, MD;  Location: [facility] ENDOSCOPY;  Service: Endoscopy;  Laterality: N/A;  . Reduction mammaplasty Bilateral    Family History  Problem Relation Age of Onset  . Heart disease Mother   . Stroke Mother   . Heart disease Father     myocardial infarction age 79  . Hypertension Mother    Social History   Social History  . Marital Status: Married    Spouse Name: N/A  . Number of Children: 2  . Years of Education: N/A   Social History Main Topics  . Smoking status: Never Smoker   .  Smokeless tobacco: Never Used  . Alcohol Use: No  . Drug Use: No  . Sexual Activity: Not Currently   Other Topics Concern  . None   Social History Narrative    Outpatient Encounter Prescriptions as of 08/30/2015  Medication Sig  . aluminum-magnesium hydroxide-simethicone (MAALOX) [phone] MG/5ML SUSP Take by mouth.  Marland Kitchen atorvastatin (LIPITOR) 20 MG tablet TAKE ONE TABLET AT BEDTIME  . benazepril (LOTENSIN) 40 MG tablet TAKE ONE (1) TABLET EACH DAY  . Calcium-Magnesium-Vitamin D 600-40-500 MG-MG-UNIT TB24 Take 630 mg by mouth 4 (four) times daily.  [name] Sodium 30-100 MG CAPS Take by mouth.  . cephALEXin (KEFLEX) 500 MG capsule Take 1 capsule (500 mg total) by mouth 4 (four) times daily.  . Cholecalciferol (VITAMIN D3) 1000 UNITS CAPS Take by mouth.  . dexlansoprazole (DEXILANT) 60 MG capsule Take 60 mg by mouth daily.  Marland Kitchen dicyclomine (BENTYL) 10 MG capsule Take 10 mg by mouth 4 (four) times  daily -  before meals and at bedtime.  . DULoxetine (CYMBALTA) 60 MG capsule Take 60 mg by mouth daily.  . fluticasone (FLONASE) 50 MCG/ACT nasal spray Place 2 sprays into the nose daily.  . furosemide (LASIX) 20 MG tablet TAKE ONE (1) TABLET EACH DAY AS NEEDED  . gabapentin (NEURONTIN) 300 MG capsule Take 300 mg by mouth 3 (three) times daily.  . hydrochlorothiazide (HYDRODIURIL) 25 MG tablet TAKE ONE (1) TABLET EACH DAY  . HYDROcodone-acetaminophen (NORCO) 10-325 MG per tablet Take 1 tablet by mouth every 4 (four) hours as needed.  . Inulin (FIBER CHOICE PO) Take 2 tablets by mouth daily. Chew tablets  . magnesium oxide (MAG-OX) 400 MG tablet Take 1 tablet (400 mg total) by mouth daily.  . metoprolol succinate (TOPROL-XL) 100 MG 24 hr tablet TAKE ONE TABLET TWICE DAILY. TAKE WITH OR IMMEDIATELY FOLLOWING A MEAL  . morphine (MS CONTIN) 60 MG 12 hr tablet Take 60 mg by mouth every 8 (eight) hours.  . Multiple Vitamins-Minerals (CENTRUM SILVER ADULT 50+ PO) Take by mouth daily.  Marland Kitchen nystatin cream (MYCOSTATIN) Apply topically 2 (two) times daily.  . ondansetron (ZOFRAN-ODT) 4 MG disintegrating tablet Take 4 mg by mouth every 8 (eight) hours as needed for nausea or vomiting.  Marland Kitchen oxyCODONE (ROXICODONE) 5 MG immediate release tablet Take 1 tablet (5 mg total) by mouth every 6 (six) hours as needed for moderate pain (Only take this medication when you are supervised and are going to be lying in bed as it can make you dizzy and increase risk of falling.). Do not drive while taking this medication.  . pantoprazole (PROTONIX) 40 MG tablet Take 40 mg by mouth 2 (two) times daily.  . polyethylene glycol powder (GLYCOLAX/MIRALAX) powder MIX 17 GRAMS (AS MARKED IN BOTTLE TOP) IN 8 OUNCES OF WATER, MIX AND DRINK ONCEA DAY AS DIRECTED.  . Probiotic Product (ALIGN PO) Take by mouth daily.  . Simethicone (GAS-X PO) Take by mouth.  . sodium chloride (OCEAN) 0.65 % nasal spray Place 1 spray into the nose as needed.    Marland Kitchen SYNTHROID 112 MCG tablet TAKE ONE (1) TABLET EACH DAY  . [DISCONTINUED] amLODipine (NORVASC) 5 MG tablet Take 1 tablet (5 mg total) by mouth daily.  Marland Kitchen amoxicillin (AMOXIL) 875 MG tablet Take 1 tablet (875 mg total) by mouth 2 (two) times daily.   No facility-administered encounter medications on file as of 08/30/2015.    Review of Systems  Constitutional: Negative for fever and chills.  Respiratory: Negative for  chest tightness and shortness of breath.   Cardiovascular: Negative for chest pain and leg swelling.  Gastrointestinal: Negative for nausea, vomiting, abdominal pain and diarrhea.  Genitourinary: Positive for dysuria and frequency.       No vaginal symptoms.   Musculoskeletal: Positive for back pain (chronic back pain. ).  Skin: Negative for color change and rash.  Psychiatric/Behavioral: Negative for dysphoric mood and agitation.       Objective:    Physical Exam  Constitutional: She appears well-developed and well-nourished. No distress.  Neck: Neck supple.  Cardiovascular: Normal rate and regular rhythm.   Pulmonary/Chest: Breath sounds normal. No respiratory distress. She has no wheezes.  Abdominal: Soft. Bowel sounds are normal. There is no tenderness.  Musculoskeletal: She exhibits no edema or tenderness.  Lymphadenopathy:    She has no cervical adenopathy.  Skin: No rash noted. No erythema.  Psychiatric: She has a normal mood and affect. Her behavior is normal.    BP 146/72 mmHg  Pulse 60  Temp(Src) 98 F (36.7 C) (Oral)  Wt 196 lb (88.905 kg)  SpO2 99% Wt Readings from Last 3 Encounters:  08/30/15 196 lb (88.905 kg)  08/21/15 206 lb (93.441 kg)  08/18/15 210 lb (95.255 kg)     Lab Results  Component Value Date   WBC 7.0 07/12/2015   HGB 11.4* 07/12/2015   HCT 34.6* 07/12/2015   PLT 198 07/12/2015   GLUCOSE 81 08/30/2015   CHOL 144 05/23/2015   TRIG 171.0* 05/23/2015   HDL 46.80 05/23/2015   LDLCALC 63 05/23/2015   ALT 34 05/23/2015   AST  18 05/23/2015   NA 139 08/30/2015   K 4.9 08/30/2015   CL 100 08/30/2015   CREATININE 0.79 08/30/2015   BUN 19 08/30/2015   CO2 31 08/30/2015   TSH 1.10 02/10/2015   INR 1.0 12/08/2013   HGBA1C 6.0 10/19/2014       Assessment & Plan:   Problem List Items Addressed This Visit    Acute cystitis without hematuria    Urine dip as outlined.  Symptoms c/w uti.  Reviewed recent culture.  Start on amoxicillin 873m bid.  Follow.  Has a f/u with Dr EAmalia Hailey1/23/17.  Keep appt.  Plans to f/u regarding her interstitial cystitis.  Would also like f/u and question of need for further w/up regarding reoccurring urinary tract infections.        Dysphagia    EGD 02/27/15 - s/p dilatation.  Achalasia.  Following with GI.  Being referred to a specialist.        Hypertension    Blood pressure has been well controlled.  Treat infection.  Same medication regimen.  Follow pressures.        Muscle cramps    Does describe muscle cramps.  Discussed staying hydrated and stretches.  Treat infection.  Check metabolic panel and magnesium.  She takes magnesium.  Follow.        Relevant Orders   Basic metabolic panel (Completed)   Magnesium (Completed)   Urine culture    Other Visit Diagnoses    UTI symptoms    -  Primary    Relevant Orders    POCT urinalysis dipstick (Completed)    Urine culture        SEinar Pheasant MD

## 2015-08-31 ENCOUNTER — Encounter: Payer: Self-pay | Admitting: Internal Medicine

## 2015-08-31 ENCOUNTER — Other Ambulatory Visit: Payer: Self-pay | Admitting: Internal Medicine

## 2015-08-31 DIAGNOSIS — R928 Other abnormal and inconclusive findings on diagnostic imaging of breast: Secondary | ICD-10-CM

## 2015-08-31 NOTE — Progress Notes (Signed)
Order placed for f/u diagnostic left breast mammogram and utlrasound.

## 2015-09-01 ENCOUNTER — Encounter: Payer: Self-pay | Admitting: Internal Medicine

## 2015-09-01 DIAGNOSIS — R252 Cramp and spasm: Secondary | ICD-10-CM | POA: Insufficient documentation

## 2015-09-01 LAB — URINE CULTURE: Colony Count: 85000

## 2015-09-01 NOTE — Assessment & Plan Note (Signed)
Urine dip as outlined.  Symptoms c/w uti.  Reviewed recent culture.  Start on amoxicillin 837m bid.  Follow.  Has a f/u with Dr EAmalia Hailey1/23/17.  Keep appt.  Plans to f/u regarding her interstitial cystitis.  Would also like f/u and question of need for further w/up regarding reoccurring urinary tract infections.

## 2015-09-01 NOTE — Assessment & Plan Note (Signed)
EGD 02/27/15 - s/p dilatation.  Achalasia.  Following with GI.  Being referred to a specialist.

## 2015-09-01 NOTE — Assessment & Plan Note (Signed)
Blood pressure has been well controlled.  Treat infection.  Same medication regimen.  Follow pressures.

## 2015-09-01 NOTE — Assessment & Plan Note (Signed)
Does describe muscle cramps.  Discussed staying hydrated and stretches.  Treat infection.  Check metabolic panel and magnesium.  She takes magnesium.  Follow.

## 2015-09-03 ENCOUNTER — Encounter: Payer: Self-pay | Admitting: Internal Medicine

## 2015-09-04 ENCOUNTER — Encounter: Payer: Self-pay | Admitting: Internal Medicine

## 2015-09-05 NOTE — Telephone Encounter (Signed)
Please forward the last office note and most recent cultures to Dr Amalia Hailey (urology) - per pt my chart request.  See attached my chart.  If you are not the person to forward this information, please forward to the right person.   Thanks.

## 2015-09-06 DIAGNOSIS — N3010 Interstitial cystitis (chronic) without hematuria: Secondary | ICD-10-CM | POA: Diagnosis not present

## 2015-09-11 ENCOUNTER — Ambulatory Visit: Payer: PPO

## 2015-09-11 ENCOUNTER — Other Ambulatory Visit: Payer: PPO

## 2015-09-20 DIAGNOSIS — R1319 Other dysphagia: Secondary | ICD-10-CM | POA: Diagnosis not present

## 2015-09-20 DIAGNOSIS — K220 Achalasia of cardia: Secondary | ICD-10-CM | POA: Diagnosis not present

## 2015-09-20 DIAGNOSIS — R61 Generalized hyperhidrosis: Secondary | ICD-10-CM | POA: Diagnosis not present

## 2015-09-21 ENCOUNTER — Other Ambulatory Visit: Payer: Self-pay | Admitting: Internal Medicine

## 2015-09-21 ENCOUNTER — Ambulatory Visit
Admission: RE | Admit: 2015-09-21 | Discharge: 2015-09-21 | Disposition: A | Discharge status: Home / Self Care | Payer: PPO | Source: Ambulatory Visit | Attending: Internal Medicine | Admitting: Internal Medicine

## 2015-09-21 DIAGNOSIS — R928 Other abnormal and inconclusive findings on diagnostic imaging of breast: Secondary | ICD-10-CM

## 2015-09-21 DIAGNOSIS — N63 Unspecified lump in breast: Secondary | ICD-10-CM | POA: Insufficient documentation

## 2015-09-22 DIAGNOSIS — R339 Retention of urine, unspecified: Secondary | ICD-10-CM | POA: Diagnosis not present

## 2015-09-25 ENCOUNTER — Ambulatory Visit (INDEPENDENT_AMBULATORY_CARE_PROVIDER_SITE_OTHER): Payer: PPO | Admitting: Internal Medicine

## 2015-09-25 ENCOUNTER — Encounter: Payer: Self-pay | Admitting: Internal Medicine

## 2015-09-25 VITALS — BP 130/70 | HR 60 | Temp 97.8°F | Resp 18 | Ht 65.0 in | Wt 217.2 lb

## 2015-09-25 DIAGNOSIS — E669 Obesity, unspecified: Secondary | ICD-10-CM

## 2015-09-25 DIAGNOSIS — E7800 Pure hypercholesterolemia, unspecified: Secondary | ICD-10-CM

## 2015-09-25 DIAGNOSIS — E039 Hypothyroidism, unspecified: Secondary | ICD-10-CM

## 2015-09-25 DIAGNOSIS — I6529 Occlusion and stenosis of unspecified carotid artery: Secondary | ICD-10-CM

## 2015-09-25 DIAGNOSIS — R1310 Dysphagia, unspecified: Secondary | ICD-10-CM

## 2015-09-25 DIAGNOSIS — D649 Anemia, unspecified: Secondary | ICD-10-CM

## 2015-09-25 DIAGNOSIS — I10 Essential (primary) hypertension: Secondary | ICD-10-CM

## 2015-09-25 DIAGNOSIS — L989 Disorder of the skin and subcutaneous tissue, unspecified: Secondary | ICD-10-CM

## 2015-09-25 DIAGNOSIS — G4733 Obstructive sleep apnea (adult) (pediatric): Secondary | ICD-10-CM | POA: Diagnosis not present

## 2015-09-25 DIAGNOSIS — M7989 Other specified soft tissue disorders: Secondary | ICD-10-CM

## 2015-09-25 NOTE — Progress Notes (Signed)
Pre-visit discussion using our clinic review tool. No additional management support is needed unless otherwise documented below in the visit note.

## 2015-09-25 NOTE — Progress Notes (Signed)
Patient ID: DANYLE BOENING, female   DOB: [birth year], 82 y.o.   MRN: [id number]   Subjective:    Patient ID: Kimberly Roy, female    DOB: [birth year], 82 y.o.   MRN: [id number]  HPI  Patient with past history of hypercholesterolemia, anemia, hypertension, hypothyroidism and interstitial cystitis.  She comes in today for a scheduled follow up.  She is accompanied by her husband.  History obtained from both of them.  She saw Dr Amalia Hailey.  Started on low dose suppressive abx.  No abdominal pain or cramping.  Bowels stable.  She saw Dr Stephanie Acre.  Planning for barium swallow.  See his note for details.  No chest pain or tightness.  Breathing stable.  Nasal lesion - persistent.  Refer to dermatology.  States was told needed her carotids checked.  Question of calcification in the arteries.  No headache reported.  Bowels stable.     Past Medical History  Diagnosis Date  . Hypertension   . Hyperlipidemia   . Hypothyroidism   . Anemia   . Depression   . Anxiety   . Nephrolithiasis   . Interstitial cystitis   . Pulmonary nodules     followed by Dr Raul Del  . Fatty liver   . Renal cyst     right  . Headache   . DVT (deep venous thrombosis) ([place])   . Fatty liver   . Lymphedema   . Chest pain   . Dyspnea   . Left ventricular dysfunction   . Pulmonary hypertension ([place])   . CHF (congestive heart failure) ([place])   . Constipation   . Dysphonia   . Pure hypercholesterolemia   . Hyperpiesia   . Obstructive sleep apnea   . Osteoarthritis     knees/cervical and lumbar spine  . DDD (degenerative disc disease), cervical    Past Surgical History  Procedure Laterality Date  . Breast reduction surgery      3/99  . Back surgeries    . Appendectomy    . Cervical spine surgery    . Tonsilectomy/adenoidectomy with myringotomy    . Excisional hemorrhoidectomy    . Rotator cuff surgery      blilateral  . Knee arthroscopy      left and right  . Abdominal hysterectomy      ovaries left in place  . Cataracts  Bilateral   . Hemorrhoid surgery    . Esophagogastroduodenoscopy N/A 02/27/2015    Procedure: ESOPHAGOGASTRODUODENOSCOPY (EGD);  Surgeon: Hulen Luster, MD;  Location: [facility] ENDOSCOPY;  Service: Gastroenterology;  Laterality: N/A;  . Hip surgery  2013    Right hip surgery  . Replacement total knee Bilateral   . Cardiac catheterization    . Esophageal manometry N/A 08/02/2015    Procedure: ESOPHAGEAL MANOMETRY (EM);  Surgeon: Josefine Class, MD;  Location: [facility] ENDOSCOPY;  Service: Endoscopy;  Laterality: N/A;  . Reduction mammaplasty Bilateral    Family History  Problem Relation Age of Onset  . Heart disease Mother   . Stroke Mother   . Heart disease Father     myocardial infarction age 17  . Hypertension Mother    Social History   Social History  . Marital Status: Married    Spouse Name: N/A  . Number of Children: 2  . Years of Education: N/A   Social History Main Topics  . Smoking status: Never Smoker   . Smokeless tobacco: Never Used  . Alcohol Use: No  . Drug Use:  No  . Sexual Activity: Not Currently   Other Topics Concern  . None   Social History Narrative    Outpatient Encounter Prescriptions as of 09/25/2015  Medication Sig  . aluminum-magnesium hydroxide-simethicone (MAALOX) [phone] MG/5ML SUSP Take by mouth.  Marland Kitchen amLODipine (NORVASC) 5 MG tablet TAKE 1 TABLET BY MOUTH EVERY DAY.  Marland Kitchen amoxicillin (AMOXIL) 250 MG capsule Take 250 mg by mouth daily.  Marland Kitchen atorvastatin (LIPITOR) 20 MG tablet TAKE ONE TABLET AT BEDTIME  . benazepril (LOTENSIN) 40 MG tablet TAKE ONE (1) TABLET EACH DAY  . Calcium-Magnesium-Vitamin D 600-40-500 MG-MG-UNIT TB24 Take 630 mg by mouth 4 (four) times daily.  Kimberly Roy Sodium 30-100 MG CAPS Take by mouth.  . cephALEXin (KEFLEX) 500 MG capsule Take 1 capsule (500 mg total) by mouth 4 (four) times daily.  . Cholecalciferol (VITAMIN D3) 1000 UNITS CAPS Take by mouth.  . dexlansoprazole (DEXILANT) 60 MG capsule Take 60 mg by mouth  daily.  Marland Kitchen dicyclomine (BENTYL) 10 MG capsule Take 10 mg by mouth 4 (four) times daily -  before meals and at bedtime.  . DULoxetine (CYMBALTA) 60 MG capsule Take 60 mg by mouth daily.  . fluticasone (FLONASE) 50 MCG/ACT nasal spray Place 2 sprays into the nose daily.  . furosemide (LASIX) 20 MG tablet TAKE ONE (1) TABLET EACH DAY AS NEEDED  . gabapentin (NEURONTIN) 300 MG capsule Take 300 mg by mouth 3 (three) times daily.  . hydrochlorothiazide (HYDRODIURIL) 25 MG tablet TAKE ONE (1) TABLET EACH DAY  . HYDROcodone-acetaminophen (NORCO) 10-325 MG per tablet Take 1 tablet by mouth every 4 (four) hours as needed.  . Inulin (FIBER CHOICE PO) Take 2 tablets by mouth daily. Chew tablets  . magnesium oxide (MAG-OX) 400 MG tablet Take 1 tablet (400 mg total) by mouth daily.  . metoprolol succinate (TOPROL-XL) 100 MG 24 hr tablet TAKE ONE TABLET TWICE DAILY. TAKE WITH OR IMMEDIATELY FOLLOWING A MEAL  . morphine (MS CONTIN) 60 MG 12 hr tablet Take 60 mg by mouth every 8 (eight) hours.  . Multiple Vitamins-Minerals (CENTRUM SILVER ADULT 50+ PO) Take by mouth daily.  Marland Kitchen nystatin cream (MYCOSTATIN) Apply topically 2 (two) times daily.  . ondansetron (ZOFRAN-ODT) 4 MG disintegrating tablet Take 4 mg by mouth every 8 (eight) hours as needed for nausea or vomiting.  Marland Kitchen oxyCODONE (ROXICODONE) 5 MG immediate release tablet Take 1 tablet (5 mg total) by mouth every 6 (six) hours as needed for moderate pain (Only take this medication when you are supervised and are going to be lying in bed as it can make you dizzy and increase risk of falling.). Do not drive while taking this medication.  . pantoprazole (PROTONIX) 40 MG tablet Take 40 mg by mouth 2 (two) times daily.  . polyethylene glycol powder (GLYCOLAX/MIRALAX) powder MIX 17 GRAMS (AS MARKED IN BOTTLE TOP) IN 8 OUNCES OF WATER, MIX AND DRINK ONCEA DAY AS DIRECTED.  . Probiotic Product (ALIGN PO) Take by mouth daily.  . Simethicone (GAS-X PO) Take by mouth.  .  sodium chloride (OCEAN) 0.65 % nasal spray Place 1 spray into the nose as needed.  Marland Kitchen SYNTHROID 112 MCG tablet TAKE ONE (1) TABLET EACH DAY  . [DISCONTINUED] amoxicillin (AMOXIL) 875 MG tablet Take 1 tablet (875 mg total) by mouth 2 (two) times daily.   No facility-administered encounter medications on file as of 09/25/2015.    Review of Systems  Constitutional: Negative for fever and appetite change.  HENT: Negative for congestion and  sinus pressure.   Eyes: Negative for discharge and redness.  Respiratory: Negative for cough, chest tightness and shortness of breath.   Cardiovascular: Negative for chest pain and palpitations.       Leg swelling is better.  Wearing compression hose.    Gastrointestinal: Negative for nausea, vomiting, abdominal pain and diarrhea.  Genitourinary: Negative for dysuria and difficulty urinating.  Musculoskeletal: Positive for back pain (chronic back pain). Negative for joint swelling.  Skin: Negative for color change and rash.  Neurological: Negative for dizziness and light-headedness.  Psychiatric/Behavioral: Negative for dysphoric mood and agitation.       Objective:    Physical Exam  Constitutional: She appears well-developed and well-nourished. No distress.  HENT:  Nose: Nose normal.  Mouth/Throat: Oropharynx is clear and moist.  Eyes: Conjunctivae are normal. Right eye exhibits no discharge. Left eye exhibits no discharge.  Neck: Neck supple. No thyromegaly present.  Cardiovascular: Normal rate and regular rhythm.   Pulmonary/Chest: Breath sounds normal. No respiratory distress. She has no wheezes.  Abdominal: Soft. Bowel sounds are normal. There is no tenderness.  Musculoskeletal: She exhibits no tenderness.  Swelling has improved.  Compression hose in place.   Lymphadenopathy:    She has no cervical adenopathy.  Skin: No rash noted. No erythema.  Psychiatric: She has a normal mood and affect. Her behavior is normal.    BP 130/70 mmHg   Pulse 60  Temp(Src) 97.8 F (36.6 C) (Oral)  Resp 18  Ht _0  (1.651 m)  Wt 217 lb 4 oz (98.544 kg)  BMI 36.15 kg/m2  SpO2 96% Wt Readings from Last 3 Encounters:  09/25/15 217 lb 4 oz (98.544 kg)  08/30/15 196 lb (88.905 kg)  08/21/15 206 lb (93.441 kg)     Lab Results  Component Value Date   WBC 7.0 07/12/2015   HGB 11.4* 07/12/2015   HCT 34.6* 07/12/2015   PLT 198 07/12/2015   GLUCOSE 81 08/30/2015   CHOL 144 05/23/2015   TRIG 171.0* 05/23/2015   HDL 46.80 05/23/2015   LDLCALC 63 05/23/2015   ALT 34 05/23/2015   AST 18 05/23/2015   NA 139 08/30/2015   K 4.9 08/30/2015   CL 100 08/30/2015   CREATININE 0.79 08/30/2015   BUN 19 08/30/2015   CO2 31 08/30/2015   TSH 1.10 02/10/2015   INR 1.0 12/08/2013   HGBA1C 6.0 10/19/2014    Mm Diag Breast Tomo Uni Left  09/21/2015  CLINICAL DATA:  Patient returns today to evaluate a left breast mass identified on recent screening mammogram. EXAM: DIGITAL DIAGNOSTIC LEFT MAMMOGRAM WITH 3D TOMOSYNTHESIS AND CAD COMPARISON:  Previous exams including recent screening mammogram dated 08/29/2015 and a CC projection of the left breast dated 07/27/2012. ACR Breast Density Category b: There are scattered areas of fibroglandular density. FINDINGS: On today's additional views of the left breast with 3D tomosynthesis, the questioned mass within the left breast is seen to be a benign intramammary lymph node. This lymph node, located within the far lateral left breast at posterior depth, is stable compared to the CC projection of 07/27/2012. Mammographic images were processed with CAD. IMPRESSION: No evidence of malignancy within the left breast. Stable benign intramammary lymph node confirmed within the far lateral left breast. RECOMMENDATION: Screening mammogram in one year.(Code:SM-B-01Y) I have discussed the findings and recommendations with the patient. Results were also provided in writing at the conclusion of the visit. If applicable, a reminder  letter will be sent to the patient regarding the  next appointment. BI-RADS CATEGORY  2: Benign. Electronically Signed   By: Franki Cabot M.D.   On: 09/21/2015 11:59       Assessment & Plan:   Problem List Items Addressed This Visit    Anemia   Relevant Orders   CBC with Differential/Platelet   Carotid artery calcification    Check carotid ultrasound.       Relevant Orders   US Carotid Duplex Bilateral   Dysphagia    Had EGD 02/27/15 - esophagus dilated.  Pathology - mild gastritis.  On daily PPI.  Saw Dr Stephanie Acre.  Planning for barium swallow.        External nasal lesion    Persistent.  Refer to dermatology.        Relevant Orders   Ambulatory referral to Dermatology   Hypercholesterolemia    On lipitor.  Low cholesterol diet and exercise.  Follow lipid panel and liver function tests.        Relevant Orders   Lipid panel   Hepatic function panel   Hypertension - Primary    Blood pressure has been under reasonable control.  Continue same medication regimen.  Follow pressures.  Follow metabolic panel.       Relevant Orders   Basic metabolic panel   Hypothyroidism    On thyroid replacement.  Follow tsh.       Leg swelling    Improved.  Compression hose.        Obesity (BMI 30-39.9)    Discussed diet and exercise.        OSA (obstructive sleep apnea)    CPAP          Einar Pheasant, MD

## 2015-09-26 ENCOUNTER — Encounter: Payer: Self-pay | Admitting: Internal Medicine

## 2015-09-26 ENCOUNTER — Other Ambulatory Visit: Payer: Self-pay | Admitting: Gastroenterology

## 2015-09-26 DIAGNOSIS — I6529 Occlusion and stenosis of unspecified carotid artery: Secondary | ICD-10-CM | POA: Insufficient documentation

## 2015-09-26 DIAGNOSIS — R1319 Other dysphagia: Secondary | ICD-10-CM

## 2015-09-26 NOTE — Assessment & Plan Note (Signed)
Blood pressure has been under reasonable control.  Continue same medication regimen.  Follow pressures.  Follow metabolic panel.

## 2015-09-26 NOTE — Assessment & Plan Note (Signed)
Persistent.  Refer to dermatology.

## 2015-09-26 NOTE — Assessment & Plan Note (Signed)
Had EGD 02/27/15 - esophagus dilated.  Pathology - mild gastritis.  On daily PPI.  Saw Dr Stephanie Acre.  Planning for barium swallow.

## 2015-09-26 NOTE — Assessment & Plan Note (Signed)
Check carotid ultrasound.

## 2015-09-26 NOTE — Assessment & Plan Note (Signed)
CPAP.  

## 2015-09-26 NOTE — Assessment & Plan Note (Signed)
Improved.  Compression hose.

## 2015-09-26 NOTE — Assessment & Plan Note (Signed)
On thyroid replacement.  Follow tsh.  

## 2015-09-26 NOTE — Assessment & Plan Note (Signed)
On lipitor.  Low cholesterol diet and exercise.  Follow lipid panel and liver function tests.   

## 2015-09-26 NOTE — Assessment & Plan Note (Signed)
Discussed diet and exercise 

## 2015-10-01 DIAGNOSIS — J449 Chronic obstructive pulmonary disease, unspecified: Secondary | ICD-10-CM | POA: Diagnosis not present

## 2015-10-02 DIAGNOSIS — K220 Achalasia of cardia: Secondary | ICD-10-CM | POA: Diagnosis not present

## 2015-10-02 DIAGNOSIS — K219 Gastro-esophageal reflux disease without esophagitis: Secondary | ICD-10-CM | POA: Diagnosis not present

## 2015-10-03 ENCOUNTER — Ambulatory Visit
Admission: RE | Admit: 2015-10-03 | Discharge: 2015-10-03 | Disposition: A | Discharge status: Home / Self Care | Payer: PPO | Source: Ambulatory Visit | Attending: Gastroenterology | Admitting: Gastroenterology

## 2015-10-03 ENCOUNTER — Encounter: Payer: Self-pay | Admitting: Internal Medicine

## 2015-10-03 ENCOUNTER — Other Ambulatory Visit (INDEPENDENT_AMBULATORY_CARE_PROVIDER_SITE_OTHER): Payer: PPO

## 2015-10-03 DIAGNOSIS — E7800 Pure hypercholesterolemia, unspecified: Secondary | ICD-10-CM

## 2015-10-03 DIAGNOSIS — R1319 Other dysphagia: Secondary | ICD-10-CM | POA: Diagnosis not present

## 2015-10-03 DIAGNOSIS — D649 Anemia, unspecified: Secondary | ICD-10-CM

## 2015-10-03 DIAGNOSIS — K449 Diaphragmatic hernia without obstruction or gangrene: Secondary | ICD-10-CM | POA: Diagnosis not present

## 2015-10-03 DIAGNOSIS — I10 Essential (primary) hypertension: Secondary | ICD-10-CM | POA: Diagnosis not present

## 2015-10-03 DIAGNOSIS — K219 Gastro-esophageal reflux disease without esophagitis: Secondary | ICD-10-CM | POA: Insufficient documentation

## 2015-10-03 LAB — CBC WITH DIFFERENTIAL/PLATELET
BASOS ABS: 0 10*3/uL (ref 0.0–0.1)
Basophils Relative: 0.5 % (ref 0.0–3.0)
Eosinophils Absolute: 0.2 10*3/uL (ref 0.0–0.7)
Eosinophils Relative: 2.8 % (ref 0.0–5.0)
HEMATOCRIT: 34.6 % — AB (ref 36.0–46.0)
HEMOGLOBIN: 11.1 g/dL — AB (ref 12.0–15.0)
LYMPHS PCT: 28.2 % (ref 12.0–46.0)
Lymphs Abs: 2.1 10*3/uL (ref 0.7–4.0)
MCHC: 32.3 g/dL (ref 30.0–36.0)
MCV: 98.6 fl (ref 78.0–100.0)
MONOS PCT: 7.6 % (ref 3.0–12.0)
Monocytes Absolute: 0.6 10*3/uL (ref 0.1–1.0)
NEUTROS ABS: 4.4 10*3/uL (ref 1.4–7.7)
Neutrophils Relative %: 60.9 % (ref 43.0–77.0)
Platelets: 185 10*3/uL (ref 150.0–400.0)
RBC: 3.51 Mil/uL — AB (ref 3.87–5.11)
RDW: 14.7 % (ref 11.5–15.5)
WBC: 7.3 10*3/uL (ref 4.0–10.5)

## 2015-10-03 LAB — HEPATIC FUNCTION PANEL
ALBUMIN: 3.8 g/dL (ref 3.5–5.2)
ALT: 13 U/L (ref 0–35)
AST: 17 U/L (ref 0–37)
Alkaline Phosphatase: 67 U/L (ref 39–117)
Bilirubin, Direct: 0 mg/dL (ref 0.0–0.3)
TOTAL PROTEIN: 6.8 g/dL (ref 6.0–8.3)
Total Bilirubin: 0.4 mg/dL (ref 0.2–1.2)

## 2015-10-03 LAB — LIPID PANEL
CHOL/HDL RATIO: 3
Cholesterol: 132 mg/dL (ref 0–200)
HDL: 45.5 mg/dL (ref >39.00)
LDL CALC: 72 mg/dL (ref 0–99)
NONHDL: 86.16
TRIGLYCERIDES: 72 mg/dL (ref 0.0–149.0)
VLDL: 14.4 mg/dL (ref 0.0–40.0)

## 2015-10-03 LAB — BASIC METABOLIC PANEL
BUN: 17 mg/dL (ref 6–23)
CALCIUM: 9.6 mg/dL (ref 8.4–10.5)
CHLORIDE: 100 meq/L (ref 96–112)
CO2: 36 meq/L — AB (ref 19–32)
Creatinine, Ser: 0.8 mg/dL (ref 0.40–1.20)
GFR: 72.88 mL/min (ref >60.00)
Glucose, Bld: 94 mg/dL (ref 70–99)
Potassium: 4.7 mEq/L (ref 3.5–5.1)
SODIUM: 141 meq/L (ref 135–145)

## 2015-10-05 DIAGNOSIS — M797 Fibromyalgia: Secondary | ICD-10-CM | POA: Diagnosis not present

## 2015-10-05 DIAGNOSIS — Q675 Congenital deformity of spine: Secondary | ICD-10-CM | POA: Diagnosis not present

## 2015-10-05 DIAGNOSIS — M519 Unspecified thoracic, thoracolumbar and lumbosacral intervertebral disc disorder: Secondary | ICD-10-CM | POA: Diagnosis not present

## 2015-10-09 DIAGNOSIS — F5105 Insomnia due to other mental disorder: Secondary | ICD-10-CM | POA: Diagnosis not present

## 2015-10-09 DIAGNOSIS — F330 Major depressive disorder, recurrent, mild: Secondary | ICD-10-CM | POA: Diagnosis not present

## 2015-10-09 DIAGNOSIS — F419 Anxiety disorder, unspecified: Secondary | ICD-10-CM | POA: Diagnosis not present

## 2015-10-17 ENCOUNTER — Encounter: Payer: Self-pay | Admitting: Internal Medicine

## 2015-10-17 DIAGNOSIS — R682 Dry mouth, unspecified: Secondary | ICD-10-CM | POA: Diagnosis not present

## 2015-10-17 DIAGNOSIS — K219 Gastro-esophageal reflux disease without esophagitis: Secondary | ICD-10-CM | POA: Diagnosis not present

## 2015-10-17 DIAGNOSIS — G4733 Obstructive sleep apnea (adult) (pediatric): Secondary | ICD-10-CM | POA: Diagnosis not present

## 2015-10-18 ENCOUNTER — Telehealth: Payer: Self-pay | Admitting: Internal Medicine

## 2015-10-18 NOTE — Telephone Encounter (Signed)
Did we receive this in Dr. Bary Leriche mailbox?

## 2015-10-18 NOTE — Telephone Encounter (Signed)
Pam [phone]  called from [place] regarding labs being sent on 10/04/2015 she wanted to the check the status to see if we receive it? Please call and let her know if it was received or not. Thank you!

## 2015-10-19 ENCOUNTER — Encounter: Payer: Self-pay | Admitting: Internal Medicine

## 2015-10-20 NOTE — Telephone Encounter (Signed)
Please call and let pt know that we do not have xray here yet.  I can see her next Tuesday at 10:00.  If she needs evaluation earlier, can be seen at acute care and they can xray there.  She sees ortho there.

## 2015-10-25 DIAGNOSIS — I739 Peripheral vascular disease, unspecified: Secondary | ICD-10-CM | POA: Diagnosis not present

## 2015-10-25 DIAGNOSIS — I6523 Occlusion and stenosis of bilateral carotid arteries: Secondary | ICD-10-CM | POA: Diagnosis not present

## 2015-10-25 NOTE — Telephone Encounter (Signed)
Anything that I receive I place in Dr. Bary Leriche box.

## 2015-10-25 NOTE — Telephone Encounter (Signed)
Last labs on file was from 10/03/15. No abstractions found. Will request that they refax labs.

## 2015-10-31 DIAGNOSIS — J449 Chronic obstructive pulmonary disease, unspecified: Secondary | ICD-10-CM | POA: Diagnosis not present

## 2015-11-07 ENCOUNTER — Telehealth: Payer: Self-pay | Admitting: *Deleted

## 2015-11-07 DIAGNOSIS — M461 Sacroiliitis, not elsewhere classified: Secondary | ICD-10-CM | POA: Diagnosis not present

## 2015-11-07 NOTE — Telephone Encounter (Signed)
Please advise, thanks

## 2015-11-07 NOTE — Telephone Encounter (Signed)
FYI Autumn from endocrinology from [facility] clinic stated that patient endocrinology work up, was negative and the provider suggested the patient be seen by a dermatologist.

## 2015-11-08 NOTE — Telephone Encounter (Signed)
Please call back and get a little more information.  I do not mind referring pt to dermatology, but just need to know the reason for referral.  The only note I see from endocrinology is from 08/2015.  Just need more information.  Thanks.

## 2015-11-08 NOTE — Telephone Encounter (Signed)
Kimberly Roy states that she is faxing over document on pt's visit.

## 2015-11-09 ENCOUNTER — Encounter: Payer: Self-pay | Admitting: Internal Medicine

## 2015-11-09 ENCOUNTER — Ambulatory Visit (INDEPENDENT_AMBULATORY_CARE_PROVIDER_SITE_OTHER): Payer: PPO | Admitting: Internal Medicine

## 2015-11-09 VITALS — BP 164/90 | HR 70 | Temp 97.8°F | Resp 18 | Ht 65.0 in | Wt 214.2 lb

## 2015-11-09 DIAGNOSIS — M79646 Pain in unspecified finger(s): Secondary | ICD-10-CM | POA: Diagnosis not present

## 2015-11-09 DIAGNOSIS — I10 Essential (primary) hypertension: Secondary | ICD-10-CM

## 2015-11-09 DIAGNOSIS — R61 Generalized hyperhidrosis: Secondary | ICD-10-CM

## 2015-11-09 NOTE — Progress Notes (Signed)
Patient ID: Kimberly Roy, female   DOB: 1931/03/14, 82 y.o.   MRN: [id number]   Subjective:    Patient ID: Kimberly Roy, female    DOB: November 08, 1930, 82 y.o.   MRN: [id number]  HPI  Patient with past history of hypercholesterolemia, anemia and hypertension.  She comes in today as a work in with concerns regarding finger pain.  She injured her finger years ago.  Some pain now.  Notices more with gripping.  Not a significant issue for her now.   She desires no further w/up or evaluation at this time.  Breathing stable.  Concerned over increased blood pressure.  States has been running higher recently.  No chest pain.  Eating and drinking.  Swelling better.  Still with increased sweating.  States is better.  Was referred to endocrinology.  Had w/up.  Endo recommended referral to dermatology.  States already has an appt in May.     Past Medical History  Diagnosis Date  . Hypertension   . Hyperlipidemia   . Hypothyroidism   . Anemia   . Depression   . Anxiety   . Nephrolithiasis   . Interstitial cystitis   . Pulmonary nodules     followed by Dr Raul Del  . Fatty liver   . Renal cyst     right  . Headache   . DVT (deep venous thrombosis) ([place])   . Fatty liver   . Lymphedema   . Chest pain   . Dyspnea   . Left ventricular dysfunction   . Pulmonary hypertension ([place])   . CHF (congestive heart failure) ([place])   . Constipation   . Dysphonia   . Pure hypercholesterolemia   . Hyperpiesia   . Obstructive sleep apnea   . Osteoarthritis     knees/cervical and lumbar spine  . DDD (degenerative disc disease), cervical    Past Surgical History  Procedure Laterality Date  . Breast reduction surgery      3/99  . Back surgeries    . Appendectomy    . Cervical spine surgery    . Tonsilectomy/adenoidectomy with myringotomy    . Excisional hemorrhoidectomy    . Rotator cuff surgery      blilateral  . Knee arthroscopy      left and right  . Abdominal hysterectomy      ovaries left in place  .  Cataracts Bilateral   . Hemorrhoid surgery    . Esophagogastroduodenoscopy N/A 02/27/2015    Procedure: ESOPHAGOGASTRODUODENOSCOPY (EGD);  Surgeon: Hulen Luster, MD;  Location: [facility] ENDOSCOPY;  Service: Gastroenterology;  Laterality: N/A;  . Hip surgery  2013    Right hip surgery  . Replacement total knee Bilateral   . Cardiac catheterization    . Esophageal manometry N/A 08/02/2015    Procedure: ESOPHAGEAL MANOMETRY (EM);  Surgeon: Josefine Class, MD;  Location: [facility] ENDOSCOPY;  Service: Endoscopy;  Laterality: N/A;  . Reduction mammaplasty Bilateral    Family History  Problem Relation Age of Onset  . Heart disease Mother   . Stroke Mother   . Heart disease Father     myocardial infarction age 36  . Hypertension Mother    Social History   Social History  . Marital Status: Married    Spouse Name: N/A  . Number of Children: 2  . Years of Education: N/A   Social History Main Topics  . Smoking status: Never Smoker   . Smokeless tobacco: Never Used  . Alcohol Use: No  .  Drug Use: No  . Sexual Activity: Not Currently   Other Topics Concern  . None   Social History Narrative     Review of Systems  Constitutional: Negative for appetite change and unexpected weight change.  HENT: Negative for congestion and sinus pressure.   Respiratory: Negative for cough, chest tightness and shortness of breath.   Cardiovascular: Negative for chest pain, palpitations and leg swelling.  Gastrointestinal: Negative for nausea, vomiting, abdominal pain and diarrhea.  Genitourinary: Negative for dysuria and difficulty urinating.  Musculoskeletal: Positive for back pain (chronic). Negative for joint swelling.  Skin: Negative for color change and rash.  Neurological: Negative for dizziness and light-headedness.       Has some headaches.    Psychiatric/Behavioral: Negative for dysphoric mood and agitation.       Objective:     Blood pressure rechecked by me:  156/84  Physical Exam    Constitutional: She appears well-developed and well-nourished. No distress.  HENT:  Nose: Nose normal.  Mouth/Throat: Oropharynx is clear and moist.  Neck: Neck supple. No thyromegaly present.  Cardiovascular: Normal rate and regular rhythm.   Pulmonary/Chest: Breath sounds normal. No respiratory distress. She has no wheezes.  Abdominal: Soft. Bowel sounds are normal. There is no tenderness.  Musculoskeletal: She exhibits no edema or tenderness.  Lymphadenopathy:    She has no cervical adenopathy.  Skin: No rash noted. No erythema.  Psychiatric: She has a normal mood and affect. Her behavior is normal.    BP 164/90 mmHg  Pulse 70  Temp(Src) 97.8 F (36.6 C) (Oral)  Resp 18  Ht _0  (1.651 m)  Wt 214 lb 4 oz (97.183 kg)  BMI 35.65 kg/m2  SpO2 93% Wt Readings from Last 3 Encounters:  11/09/15 214 lb 4 oz (97.183 kg)  09/25/15 217 lb 4 oz (98.544 kg)  08/30/15 196 lb (88.905 kg)     Lab Results  Component Value Date   WBC 7.3 10/03/2015   HGB 11.1* 10/03/2015   HCT 34.6* 10/03/2015   PLT 185.0 10/03/2015   GLUCOSE 94 10/03/2015   CHOL 132 10/03/2015   TRIG 72.0 10/03/2015   HDL 45.50 10/03/2015   LDLCALC 72 10/03/2015   ALT 13 10/03/2015   AST 17 10/03/2015   NA 141 10/03/2015   K 4.7 10/03/2015   CL 100 10/03/2015   CREATININE 0.80 10/03/2015   BUN 17 10/03/2015   CO2 36* 10/03/2015   TSH 1.10 02/10/2015   INR 1.0 12/08/2013   HGBA1C 6.0 10/19/2014       Assessment & Plan:   Problem List Items Addressed This Visit    Excessive sweating    Persistent intermittent episodes.  Not as bad.  Still occurring.  Saw endocrinology.  W/up unrevealing.  Endocrinology recommended dermatology referral.  Pt already has appt with dermatology - Dr Kellie Moor in May.        Finger pain    Some increased pain - worse at times.  Desires no further intervention.  Follow.        Hypertension - Primary    Blood pressure elevated.  Increase amlodipine back up to 80m q  day.  Follow pressures.  Follow metabolic panel.            SEinar Pheasant MD

## 2015-11-09 NOTE — Telephone Encounter (Signed)
Pt to come in today for an appt

## 2015-11-09 NOTE — Progress Notes (Signed)
Pre-visit discussion using our clinic review tool. No additional management support is needed unless otherwise documented below in the visit note.  

## 2015-11-13 ENCOUNTER — Encounter: Payer: Self-pay | Admitting: Internal Medicine

## 2015-11-13 DIAGNOSIS — M79646 Pain in unspecified finger(s): Secondary | ICD-10-CM | POA: Insufficient documentation

## 2015-11-13 NOTE — Assessment & Plan Note (Signed)
Persistent intermittent episodes.  Not as bad.  Still occurring.  Saw endocrinology.  W/up unrevealing.  Endocrinology recommended dermatology referral.  Pt already has appt with dermatology - Dr Kellie Moor in May.

## 2015-11-13 NOTE — Assessment & Plan Note (Signed)
Some increased pain - worse at times.  Desires no further intervention.  Follow.

## 2015-11-13 NOTE — Assessment & Plan Note (Signed)
Blood pressure elevated.  Increase amlodipine back up to 71m q day.  Follow pressures.  Follow metabolic panel.

## 2015-11-27 DIAGNOSIS — I10 Essential (primary) hypertension: Secondary | ICD-10-CM | POA: Diagnosis not present

## 2015-11-27 DIAGNOSIS — G4733 Obstructive sleep apnea (adult) (pediatric): Secondary | ICD-10-CM | POA: Diagnosis not present

## 2015-11-27 DIAGNOSIS — R0600 Dyspnea, unspecified: Secondary | ICD-10-CM | POA: Diagnosis not present

## 2015-11-27 DIAGNOSIS — Z9889 Other specified postprocedural states: Secondary | ICD-10-CM | POA: Diagnosis not present

## 2015-11-27 DIAGNOSIS — I272 Other secondary pulmonary hypertension: Secondary | ICD-10-CM | POA: Diagnosis not present

## 2015-11-27 DIAGNOSIS — I890 Lymphedema, not elsewhere classified: Secondary | ICD-10-CM | POA: Diagnosis not present

## 2015-11-27 DIAGNOSIS — I5032 Chronic diastolic (congestive) heart failure: Secondary | ICD-10-CM | POA: Diagnosis not present

## 2015-11-28 ENCOUNTER — Encounter: Payer: Self-pay | Admitting: Internal Medicine

## 2015-11-29 DIAGNOSIS — J449 Chronic obstructive pulmonary disease, unspecified: Secondary | ICD-10-CM | POA: Diagnosis not present

## 2015-12-04 ENCOUNTER — Encounter: Payer: Self-pay | Admitting: Internal Medicine

## 2015-12-05 ENCOUNTER — Other Ambulatory Visit: Payer: Self-pay | Admitting: *Deleted

## 2015-12-05 ENCOUNTER — Ambulatory Visit (INDEPENDENT_AMBULATORY_CARE_PROVIDER_SITE_OTHER): Payer: PPO | Admitting: Internal Medicine

## 2015-12-05 ENCOUNTER — Encounter: Payer: Self-pay | Admitting: Internal Medicine

## 2015-12-05 VITALS — BP 138/70 | HR 60 | Temp 98.0°F | Resp 18 | Ht 65.0 in | Wt 212.8 lb

## 2015-12-05 DIAGNOSIS — I10 Essential (primary) hypertension: Secondary | ICD-10-CM

## 2015-12-05 DIAGNOSIS — R61 Generalized hyperhidrosis: Secondary | ICD-10-CM

## 2015-12-05 DIAGNOSIS — E039 Hypothyroidism, unspecified: Secondary | ICD-10-CM

## 2015-12-05 DIAGNOSIS — E7800 Pure hypercholesterolemia, unspecified: Secondary | ICD-10-CM

## 2015-12-05 DIAGNOSIS — M7989 Other specified soft tissue disorders: Secondary | ICD-10-CM

## 2015-12-05 DIAGNOSIS — R140 Abdominal distension (gaseous): Secondary | ICD-10-CM | POA: Diagnosis not present

## 2015-12-05 DIAGNOSIS — K219 Gastro-esophageal reflux disease without esophagitis: Secondary | ICD-10-CM

## 2015-12-05 DIAGNOSIS — G4733 Obstructive sleep apnea (adult) (pediatric): Secondary | ICD-10-CM

## 2015-12-05 LAB — BASIC METABOLIC PANEL
BUN: 19 mg/dL (ref 6–23)
CALCIUM: 9.3 mg/dL (ref 8.4–10.5)
CO2: 34 meq/L — AB (ref 19–32)
Chloride: 101 mEq/L (ref 96–112)
Creatinine, Ser: 0.75 mg/dL (ref 0.40–1.20)
GFR: 78.48 mL/min (ref >60.00)
GLUCOSE: 82 mg/dL (ref 70–99)
POTASSIUM: 4.6 meq/L (ref 3.5–5.1)
SODIUM: 140 meq/L (ref 135–145)

## 2015-12-05 LAB — HEPATIC FUNCTION PANEL
ALT: 12 U/L (ref 0–35)
AST: 16 U/L (ref 0–37)
Albumin: 3.9 g/dL (ref 3.5–5.2)
Alkaline Phosphatase: 72 U/L (ref 39–117)
BILIRUBIN TOTAL: 0.4 mg/dL (ref 0.2–1.2)
Bilirubin, Direct: 0.1 mg/dL (ref 0.0–0.3)
Total Protein: 6.4 g/dL (ref 6.0–8.3)

## 2015-12-05 LAB — CBC WITH DIFFERENTIAL/PLATELET
BASOS ABS: 0 10*3/uL (ref 0.0–0.1)
Basophils Relative: 0.2 % (ref 0.0–3.0)
EOS PCT: 2.3 % (ref 0.0–5.0)
Eosinophils Absolute: 0.2 10*3/uL (ref 0.0–0.7)
HCT: 32.9 % — ABNORMAL LOW (ref 36.0–46.0)
Hemoglobin: 11 g/dL — ABNORMAL LOW (ref 12.0–15.0)
LYMPHS ABS: 2.1 10*3/uL (ref 0.7–4.0)
Lymphocytes Relative: 27.6 % (ref 12.0–46.0)
MCHC: 33.4 g/dL (ref 30.0–36.0)
MCV: 97 fl (ref 78.0–100.0)
MONO ABS: 0.6 10*3/uL (ref 0.1–1.0)
Monocytes Relative: 8.1 % (ref 3.0–12.0)
NEUTROS PCT: 61.8 % (ref 43.0–77.0)
Neutro Abs: 4.7 10*3/uL (ref 1.4–7.7)
Platelets: 180 10*3/uL (ref 150.0–400.0)
RBC: 3.39 Mil/uL — AB (ref 3.87–5.11)
RDW: 14 % (ref 11.5–15.5)
WBC: 7.6 10*3/uL (ref 4.0–10.5)

## 2015-12-05 MED ORDER — METOPROLOL SUCCINATE ER 100 MG PO TB24: ORAL_TABLET | ORAL | Status: DC

## 2015-12-05 NOTE — Progress Notes (Signed)
Patient ID: Kimberly Roy, female   DOB: [birth year], 82 y.o.   MRN: [id number]   Subjective:    Patient ID: Kimberly Roy, female    DOB: October 16, 1930, 82 y.o.   MRN: [id number]  HPI  Patient here for a scheduled follow up.  She reports that she has noticed her stomach growling and rolling more.  Some increased belching.  Some acid reflux despite taking her medication.  This has been present for approximately 10 days.  Some soft stool with increased gas.  Breathing stable.  No chest pain.  Eating.  Has f/u appt with GI, but is a ways out.  Swelling better.     Past Medical History  Diagnosis Date  . Hypertension   . Hyperlipidemia   . Hypothyroidism   . Anemia   . Depression   . Anxiety   . Nephrolithiasis   . Interstitial cystitis   . Pulmonary nodules     followed by Dr Raul Del  . Fatty liver   . Renal cyst     right  . Headache   . DVT (deep venous thrombosis) ([place])   . Fatty liver   . Lymphedema   . Chest pain   . Dyspnea   . Left ventricular dysfunction   . Pulmonary hypertension ([place])   . CHF (congestive heart failure) ([place])   . Constipation   . Dysphonia   . Pure hypercholesterolemia   . Hyperpiesia   . Obstructive sleep apnea   . Osteoarthritis     knees/cervical and lumbar spine  . DDD (degenerative disc disease), cervical    Past Surgical History  Procedure Laterality Date  . Breast reduction surgery      3/99  . Back surgeries    . Appendectomy    . Cervical spine surgery    . Tonsilectomy/adenoidectomy with myringotomy    . Excisional hemorrhoidectomy    . Rotator cuff surgery      blilateral  . Knee arthroscopy      left and right  . Abdominal hysterectomy      ovaries left in place  . Cataracts Bilateral   . Hemorrhoid surgery    . Esophagogastroduodenoscopy N/A 02/27/2015    Procedure: ESOPHAGOGASTRODUODENOSCOPY (EGD);  Surgeon: Hulen Luster, MD;  Location: [facility] ENDOSCOPY;  Service: Gastroenterology;  Laterality: N/A;  . Hip surgery  2013    Right hip  surgery  . Replacement total knee Bilateral   . Cardiac catheterization    . Esophageal manometry N/A 08/02/2015    Procedure: ESOPHAGEAL MANOMETRY (EM);  Surgeon: Josefine Class, MD;  Location: [facility] ENDOSCOPY;  Service: Endoscopy;  Laterality: N/A;  . Reduction mammaplasty Bilateral    Family History  Problem Relation Age of Onset  . Heart disease Mother   . Stroke Mother   . Heart disease Father     myocardial infarction age 64  . Hypertension Mother    Social History   Social History  . Marital Status: Married    Spouse Name: N/A  . Number of Children: 2  . Years of Education: N/A   Social History Main Topics  . Smoking status: Never Smoker   . Smokeless tobacco: Never Used  . Alcohol Use: No  . Drug Use: No  . Sexual Activity: Not Currently   Other Topics Concern  . None   Social History Narrative    Outpatient Encounter Prescriptions as of 12/05/2015  Medication Sig  . aluminum-magnesium hydroxide-simethicone (MAALOX) [phone] MG/5ML SUSP Take by  mouth.  . amLODipine (NORVASC) 5 MG tablet TAKE 1 TABLET BY MOUTH EVERY DAY.  Marland Kitchen atorvastatin (LIPITOR) 20 MG tablet TAKE ONE TABLET AT BEDTIME  . benazepril (LOTENSIN) 40 MG tablet TAKE ONE (1) TABLET EACH DAY  . Calcium-Magnesium-Vitamin D 600-40-500 MG-MG-UNIT TB24 Take 630 mg by mouth 4 (four) times daily.  [name] Sodium 30-100 MG CAPS Take by mouth.  . cephALEXin (KEFLEX) 250 MG capsule Take 250 mg by mouth daily.  . Cholecalciferol (VITAMIN D3) 1000 UNITS CAPS Take by mouth.  . dicyclomine (BENTYL) 10 MG capsule Take 10 mg by mouth 4 (four) times daily -  before meals and at bedtime.  . DULoxetine (CYMBALTA) 60 MG capsule Take 60 mg by mouth daily.  . fluticasone (FLONASE) 50 MCG/ACT nasal spray Place 2 sprays into the nose daily.  . furosemide (LASIX) 20 MG tablet TAKE ONE (1) TABLET EACH DAY AS NEEDED  . gabapentin (NEURONTIN) 300 MG capsule Take 300 mg by mouth 3 (three) times daily.  .  hydrochlorothiazide (HYDRODIURIL) 25 MG tablet TAKE ONE (1) TABLET EACH DAY  . HYDROcodone-acetaminophen (NORCO) 10-325 MG per tablet Take 1 tablet by mouth every 4 (four) hours as needed.  . Inulin (FIBER CHOICE PO) Take 2 tablets by mouth daily. Chew tablets  . magnesium oxide (MAG-OX) 400 MG tablet Take 1 tablet (400 mg total) by mouth daily.  . metoprolol succinate (TOPROL-XL) 100 MG 24 hr tablet TAKE ONE TABLET TWICE DAILY. TAKE WITH OR IMMEDIATELY FOLLOWING A MEAL  . morphine (MS CONTIN) 30 MG 12 hr tablet   . morphine (MS CONTIN) 60 MG 12 hr tablet Take 60 mg by mouth every 8 (eight) hours.  Marland Kitchen nystatin cream (MYCOSTATIN) Apply topically 2 (two) times daily.  . ondansetron (ZOFRAN-ODT) 4 MG disintegrating tablet Take 4 mg by mouth every 8 (eight) hours as needed for nausea or vomiting.  . pantoprazole (PROTONIX) 40 MG tablet Take 40 mg by mouth 2 (two) times daily.  . polyethylene glycol powder (GLYCOLAX/MIRALAX) powder MIX 17 GRAMS (AS MARKED IN BOTTLE TOP) IN 8 OUNCES OF WATER, MIX AND DRINK ONCEA DAY AS DIRECTED.  . Probiotic Product (ALIGN PO) Take by mouth daily.  . Simethicone (GAS-X PO) Take by mouth.  . sodium chloride (OCEAN) 0.65 % nasal spray Place 1 spray into the nose as needed.  Marland Kitchen SYNTHROID 112 MCG tablet TAKE ONE (1) TABLET EACH DAY  . [DISCONTINUED] cephALEXin (KEFLEX) 500 MG capsule Take 1 capsule (500 mg total) by mouth 4 (four) times daily. (Patient taking differently: Take 250 mg by mouth daily. )  . [DISCONTINUED] dexlansoprazole (DEXILANT) 60 MG capsule Take 60 mg by mouth daily.  . [DISCONTINUED] Multiple Vitamins-Minerals (CENTRUM SILVER ADULT 50+ PO) Take by mouth daily.  . [DISCONTINUED] oxyCODONE (ROXICODONE) 5 MG immediate release tablet Take 1 tablet (5 mg total) by mouth every 6 (six) hours as needed for moderate pain (Only take this medication when you are supervised and are going to be lying in bed as it can make you dizzy and increase risk of falling.). Do not  drive while taking this medication.  . [DISCONTINUED] amoxicillin (AMOXIL) 250 MG capsule Take 250 mg by mouth daily.  . [DISCONTINUED] metoprolol succinate (TOPROL-XL) 100 MG 24 hr tablet TAKE ONE TABLET TWICE DAILY. TAKE WITH OR IMMEDIATELY FOLLOWING A MEAL   No facility-administered encounter medications on file as of 12/05/2015.    Review of Systems  Constitutional: Negative for fever and appetite change.  HENT: Negative for congestion and  sinus pressure.   Respiratory: Negative for cough, chest tightness and shortness of breath.   Cardiovascular: Negative for chest pain, palpitations and leg swelling.  Gastrointestinal:       Increased gas and belching.  Some soft stool.  Some acid reflux.    Genitourinary: Negative for dysuria and difficulty urinating.  Musculoskeletal: Positive for back pain (chronic back pain. ). Negative for joint swelling.  Skin: Negative for color change and rash.  Neurological: Negative for dizziness and light-headedness.  Psychiatric/Behavioral: Negative for dysphoric mood and agitation.       Objective:     Blood pressure rechecked by me:  130/62  Physical Exam  Constitutional: She appears well-developed and well-nourished. No distress.  HENT:  Nose: Nose normal.  Mouth/Throat: Oropharynx is clear and moist.  Neck: Neck supple. No thyromegaly present.  Cardiovascular: Normal rate and regular rhythm.   Pulmonary/Chest: Breath sounds normal. No respiratory distress. She has no wheezes.  Abdominal: Soft. Bowel sounds are normal.  No significant tenderness to palpation.    Musculoskeletal: She exhibits no edema or tenderness.  Lymphadenopathy:    She has no cervical adenopathy.  Skin: No rash noted. No erythema.  Psychiatric: She has a normal mood and affect. Her behavior is normal.    BP 138/70 mmHg  Pulse 60  Temp(Src) 98 F (36.7 C) (Oral)  Resp 18  Ht _0  (1.651 m)  Wt 212 lb 12 oz (96.503 kg)  BMI 35.40 kg/m2  SpO2 96% Wt Readings  from Last 3 Encounters:  12/05/15 212 lb 12 oz (96.503 kg)  11/09/15 214 lb 4 oz (97.183 kg)  09/25/15 217 lb 4 oz (98.544 kg)     Lab Results  Component Value Date   WBC 7.6 12/05/2015   HGB 11.0* 12/05/2015   HCT 32.9* 12/05/2015   PLT 180.0 12/05/2015   GLUCOSE 82 12/05/2015   CHOL 132 10/03/2015   TRIG 72.0 10/03/2015   HDL 45.50 10/03/2015   LDLCALC 72 10/03/2015   ALT 12 12/05/2015   AST 16 12/05/2015   NA 140 12/05/2015   K 4.6 12/05/2015   CL 101 12/05/2015   CREATININE 0.75 12/05/2015   BUN 19 12/05/2015   CO2 34* 12/05/2015   TSH 1.10 02/10/2015   INR 1.0 12/08/2013   HGBA1C 6.0 10/19/2014    Dg Ugi W/o Kub  10/03/2015  CLINICAL DATA:  Dysphagia for 1 year.  Abdominal pain reflux. EXAM: UPPER GI SERIES WITHOUT KUB TECHNIQUE: Routine upper GI series was performed with thin and thick barium. FLUOROSCOPY TIME:  Radiation Exposure Index (as provided by the fluoroscopic device): 20.4 mGy COMPARISON:  None. FINDINGS: Examination of the esophagus demonstrated normal esophageal motility. Normal esophageal morphology without evidence of esophagitis or ulceration. No esophageal stricture, diverticula, or mass lesion. There is a small hiatal hernia. There is severe gastroesophageal reflux. Examination of the stomach demonstrated normal rugal folds and areae gastricae. The gastric mucosa appeared unremarkable without evidence of ulceration, scarring, or mass lesion. Gastric motility and emptying was normal. Fluoroscopic examination of the duodenum demonstrates normal motility and morphology without evidence of ulceration or mass lesion. IMPRESSION: 1. Severe gastroesophageal reflux. 2. Otherwise normal upper GI. Electronically Signed   By: Kathreen Devoid   On: 10/03/2015 10:31       Assessment & Plan:   Problem List Items Addressed This Visit    Excessive sweating    Saw endocrinology.  See their note and my last note.  W/up unrevealing.  Endocrinology recommended dermatology  evaluation.        GERD (gastroesophageal reflux disease)    Has seen GI.  They recommended her to start dexilant.  Will start.  Stop protonix.  Add zantac in the evening.  F/u with Gi.  With increased belching. Probiotic.  Check stool for c.diff.  Discussed further w/up.  She declines.  Wants to start with above first.  Follow.        Hypercholesterolemia    Low cholesterol diet and exercise.  On lipitor.  Follow lipid panel and liver function tests.        Hypertension    Blood pressure under good control.  Continue same medication regimen.  Follow pressures.  Follow metabolic panel.        Hypothyroidism    On thyroid replacement.  Follow tsh.       Leg swelling    Wearing compression hose.  Improved.        OSA (obstructive sleep apnea)    CPAP.        Other Visit Diagnoses    Abdominal bloating    -  Primary    Relevant Orders    CBC with Differential/Platelet (Completed)    Hepatic function panel (Completed)    Basic metabolic panel (Completed)    C. difficile GDH and Toxin A/B (Completed)        Einar Pheasant, MD

## 2015-12-05 NOTE — Patient Instructions (Signed)
Stop the protonix  Start dexilant as prescribed by GI  Start zantac (ranitidine) 141m - take 30 minutes before your evening meal

## 2015-12-05 NOTE — Progress Notes (Signed)
Pre-visit discussion using our clinic review tool. No additional management support is needed unless otherwise documented below in the visit note.  

## 2015-12-06 ENCOUNTER — Encounter: Payer: Self-pay | Admitting: Internal Medicine

## 2015-12-08 ENCOUNTER — Telehealth: Payer: Self-pay | Admitting: *Deleted

## 2015-12-08 ENCOUNTER — Other Ambulatory Visit: Payer: PPO

## 2015-12-08 DIAGNOSIS — R140 Abdominal distension (gaseous): Secondary | ICD-10-CM | POA: Diagnosis not present

## 2015-12-08 NOTE — Telephone Encounter (Signed)
Her note states that she had more pain and more bowel movements yesterday.  Can see how doing today.  Continue probiotic.  If increased pain and diarrhea, needs evaluation.  To acute care.

## 2015-12-08 NOTE — Telephone Encounter (Signed)
QAE:[id number] husband dropped off a note for Dr.Scott,to read. Note is in mail box.

## 2015-12-08 NOTE — Telephone Encounter (Signed)
In folder

## 2015-12-08 NOTE — Telephone Encounter (Signed)
Please advise if I need to do anything with this. thanks

## 2015-12-08 NOTE — Telephone Encounter (Signed)
Spoke with the patient, she had 4 BM's yesterday, and had increased abdominal pain from the BM's.  She states that today she has only had one BM and the pain has gotten better.  Her husband dropped off a cdiff stool specimen today.  I explained that the resulting will not occur today.  She said that was fine.  I advised her that if her pain increased please go to a urgent care, but we would follow up on next week with results when they come in.  She appreciated the call. Thanks

## 2015-12-09 ENCOUNTER — Encounter: Payer: Self-pay | Admitting: Internal Medicine

## 2015-12-09 LAB — C. DIFFICILE GDH AND TOXIN A/B
C. DIFF TOXIN A/B: NOT DETECTED
C. DIFFICILE GDH: NOT DETECTED

## 2015-12-10 ENCOUNTER — Encounter: Payer: Self-pay | Admitting: Internal Medicine

## 2015-12-10 DIAGNOSIS — K219 Gastro-esophageal reflux disease without esophagitis: Secondary | ICD-10-CM | POA: Insufficient documentation

## 2015-12-10 NOTE — Assessment & Plan Note (Signed)
Wearing compression hose.  Improved.

## 2015-12-10 NOTE — Assessment & Plan Note (Signed)
On thyroid replacement.  Follow tsh.  

## 2015-12-10 NOTE — Assessment & Plan Note (Signed)
Saw endocrinology.  See their note and my last note.  W/up unrevealing.  Endocrinology recommended dermatology evaluation.

## 2015-12-10 NOTE — Assessment & Plan Note (Signed)
Low cholesterol diet and exercise.  On lipitor.  Follow lipid panel and liver function tests.   

## 2015-12-10 NOTE — Assessment & Plan Note (Signed)
CPAP.  

## 2015-12-10 NOTE — Assessment & Plan Note (Signed)
Has seen GI.  They recommended her to start dexilant.  Will start.  Stop protonix.  Add zantac in the evening.  F/u with Gi.  With increased belching. Probiotic.  Check stool for c.diff.  Discussed further w/up.  She declines.  Wants to start with above first.  Follow.

## 2015-12-10 NOTE — Assessment & Plan Note (Signed)
Blood pressure under good control.  Continue same medication regimen.  Follow pressures.  Follow metabolic panel.   

## 2015-12-11 ENCOUNTER — Telehealth: Payer: Self-pay | Admitting: *Deleted

## 2015-12-11 ENCOUNTER — Encounter: Payer: Self-pay | Admitting: Internal Medicine

## 2015-12-11 NOTE — Telephone Encounter (Signed)
Sent patient a Pharmacist, community message as I was not able to get her via phone.

## 2015-12-11 NOTE — Telephone Encounter (Signed)
The following message is a copy and paste, patient would like a call in reference to:  Dr. Nicki Reaper-- Sunday night when I was eating dinner I had a problem with my food going down and tea swallowing after. All of a sudden I felt like this was blocked and I had difficult breathing. The windpipe was blocked and I could not get my breath. When I started to get ready for bed I had the same problem It felt like I had a tickling in my throat and it was hard to get air thru my windpipe. I am having a problem today trying to clear my throat. Feels like something needs to either come up or go down. I wonder if I need a chest Xray or CT of my throat and chest. My voice is very deep. I will call your office and ask [place] to read this and you can let me know if I need to see you. I have not had a problem with swallowing today.

## 2015-12-11 NOTE — Telephone Encounter (Signed)
Please advise patient.

## 2015-12-11 NOTE — Telephone Encounter (Signed)
Attempted to reach the patient, phone line is busy, will try again.

## 2015-12-11 NOTE — Telephone Encounter (Signed)
Is she having problems getting her breath today.  If so, she needs ER evaluation.  If notices trouble swallowing and food not going down, I can see if GI can see her on a more emergent basis.

## 2015-12-12 NOTE — Telephone Encounter (Signed)
Patient responded to email.

## 2015-12-12 NOTE — Telephone Encounter (Signed)
Dr. Nicki Reaper set up appt for Patient on 4/13 at 10am at the [facility] [place] office , [id number].  Attempted to call patient, left a VM, sent Mychart message with details. Thanks

## 2015-12-13 DIAGNOSIS — L821 Other seborrheic keratosis: Secondary | ICD-10-CM | POA: Diagnosis not present

## 2015-12-13 DIAGNOSIS — L74519 Primary focal hyperhidrosis, unspecified: Secondary | ICD-10-CM | POA: Diagnosis not present

## 2015-12-13 DIAGNOSIS — L728 Other follicular cysts of the skin and subcutaneous tissue: Secondary | ICD-10-CM | POA: Diagnosis not present

## 2015-12-14 DIAGNOSIS — K219 Gastro-esophageal reflux disease without esophagitis: Secondary | ICD-10-CM | POA: Diagnosis not present

## 2015-12-14 DIAGNOSIS — K220 Achalasia of cardia: Secondary | ICD-10-CM | POA: Diagnosis not present

## 2015-12-15 ENCOUNTER — Other Ambulatory Visit: Payer: Self-pay | Admitting: Internal Medicine

## 2015-12-30 DIAGNOSIS — J449 Chronic obstructive pulmonary disease, unspecified: Secondary | ICD-10-CM | POA: Diagnosis not present

## 2016-01-02 DIAGNOSIS — K219 Gastro-esophageal reflux disease without esophagitis: Secondary | ICD-10-CM | POA: Diagnosis not present

## 2016-01-08 ENCOUNTER — Other Ambulatory Visit: Payer: Self-pay | Admitting: Internal Medicine

## 2016-01-08 DIAGNOSIS — Z79891 Long term (current) use of opiate analgesic: Secondary | ICD-10-CM | POA: Diagnosis not present

## 2016-01-08 DIAGNOSIS — M519 Unspecified thoracic, thoracolumbar and lumbosacral intervertebral disc disorder: Secondary | ICD-10-CM | POA: Diagnosis not present

## 2016-01-08 DIAGNOSIS — Q675 Congenital deformity of spine: Secondary | ICD-10-CM | POA: Diagnosis not present

## 2016-01-08 DIAGNOSIS — G894 Chronic pain syndrome: Secondary | ICD-10-CM | POA: Diagnosis not present

## 2016-01-08 DIAGNOSIS — S76312A Strain of muscle, fascia and tendon of the posterior muscle group at thigh level, left thigh, initial encounter: Secondary | ICD-10-CM | POA: Diagnosis not present

## 2016-01-09 ENCOUNTER — Encounter: Payer: Self-pay | Admitting: Internal Medicine

## 2016-01-09 NOTE — Telephone Encounter (Signed)
Please call pt and let her know that I am out of the office and the given these symptoms she needs to go ahead and be evaluated.  We can f/u after.  (has leg swelling and severe abdominal pain).  Needs evaluation today.

## 2016-01-10 ENCOUNTER — Encounter: Payer: Self-pay | Admitting: Internal Medicine

## 2016-01-11 NOTE — Telephone Encounter (Signed)
Where I was out, they have filled my schedule for next week.  I am going to be gone the end of next week and beginning of the following week.  What I recommend is if she is having increased abdominal pain - to f/u with her gastroenterologist.  If increased swelling and problems with her legs can f/u with vascular.  (or she can be evaluated at acute care to confirm nothing more acute going on and then I can f/u after)

## 2016-01-16 DIAGNOSIS — S76312D Strain of muscle, fascia and tendon of the posterior muscle group at thigh level, left thigh, subsequent encounter: Secondary | ICD-10-CM | POA: Diagnosis not present

## 2016-01-19 ENCOUNTER — Encounter: Payer: Self-pay | Admitting: Internal Medicine

## 2016-01-19 NOTE — Telephone Encounter (Signed)
Please let her know that I am out of the office today and beginning of next week.  Given headache and elevated blood pressure, she needs to be seen today.  She can also let her cardiologist know of elevated blood pressure.  If unable to reach, see if has emergency contact number.

## 2016-01-20 ENCOUNTER — Emergency Department
Admission: EM | Admit: 2016-01-20 | Discharge: 2016-01-21 | Disposition: A | Discharge status: Home / Self Care | Payer: PPO | Attending: Emergency Medicine | Admitting: Emergency Medicine

## 2016-01-20 ENCOUNTER — Emergency Department: Payer: PPO

## 2016-01-20 ENCOUNTER — Encounter: Payer: Self-pay | Admitting: Emergency Medicine

## 2016-01-20 DIAGNOSIS — Z79899 Other long term (current) drug therapy: Secondary | ICD-10-CM | POA: Insufficient documentation

## 2016-01-20 DIAGNOSIS — M1990 Unspecified osteoarthritis, unspecified site: Secondary | ICD-10-CM | POA: Insufficient documentation

## 2016-01-20 DIAGNOSIS — Z794 Long term (current) use of insulin: Secondary | ICD-10-CM | POA: Insufficient documentation

## 2016-01-20 DIAGNOSIS — M5030 Other cervical disc degeneration, unspecified cervical region: Secondary | ICD-10-CM | POA: Insufficient documentation

## 2016-01-20 DIAGNOSIS — M810 Age-related osteoporosis without current pathological fracture: Secondary | ICD-10-CM | POA: Diagnosis not present

## 2016-01-20 DIAGNOSIS — I10 Essential (primary) hypertension: Secondary | ICD-10-CM | POA: Diagnosis not present

## 2016-01-20 DIAGNOSIS — E785 Hyperlipidemia, unspecified: Secondary | ICD-10-CM | POA: Insufficient documentation

## 2016-01-20 DIAGNOSIS — I110 Hypertensive heart disease with heart failure: Secondary | ICD-10-CM | POA: Insufficient documentation

## 2016-01-20 DIAGNOSIS — Z792 Long term (current) use of antibiotics: Secondary | ICD-10-CM | POA: Insufficient documentation

## 2016-01-20 DIAGNOSIS — E039 Hypothyroidism, unspecified: Secondary | ICD-10-CM | POA: Diagnosis not present

## 2016-01-20 DIAGNOSIS — Z86718 Personal history of other venous thrombosis and embolism: Secondary | ICD-10-CM | POA: Insufficient documentation

## 2016-01-20 DIAGNOSIS — F329 Major depressive disorder, single episode, unspecified: Secondary | ICD-10-CM | POA: Insufficient documentation

## 2016-01-20 DIAGNOSIS — I509 Heart failure, unspecified: Secondary | ICD-10-CM | POA: Insufficient documentation

## 2016-01-20 DIAGNOSIS — R51 Headache: Secondary | ICD-10-CM | POA: Diagnosis not present

## 2016-01-20 LAB — BASIC METABOLIC PANEL
Anion gap: 8 (ref 5–15)
BUN: 24 mg/dL — AB (ref 6–20)
CALCIUM: 10 mg/dL (ref 8.9–10.3)
CO2: 31 mmol/L (ref 22–32)
CREATININE: 0.94 mg/dL (ref 0.44–1.00)
Chloride: 96 mmol/L — ABNORMAL LOW (ref 101–111)
GFR calc Af Amer: >60 mL/min (ref >60)
GFR, EST NON AFRICAN AMERICAN: 55 mL/min — AB (ref >60)
GLUCOSE: 123 mg/dL — AB (ref 65–99)
Potassium: 3.7 mmol/L (ref 3.5–5.1)
Sodium: 135 mmol/L (ref 135–145)

## 2016-01-20 LAB — URINALYSIS COMPLETE WITH MICROSCOPIC (ARMC ONLY)
Bacteria, UA: NONE SEEN
Bilirubin Urine: NEGATIVE
Glucose, UA: NEGATIVE mg/dL
Hgb urine dipstick: NEGATIVE
KETONES UR: NEGATIVE mg/dL
Leukocytes, UA: NEGATIVE
Nitrite: NEGATIVE
PROTEIN: NEGATIVE mg/dL
RBC / HPF: NONE SEEN RBC/hpf (ref 0–5)
Specific Gravity, Urine: 1.006 (ref 1.005–1.030)
pH: 7 (ref 5.0–8.0)

## 2016-01-20 LAB — CBC
HCT: 37 % (ref 35.0–47.0)
Hemoglobin: 12.4 g/dL (ref 12.0–16.0)
MCH: 32.4 pg (ref 26.0–34.0)
MCHC: 33.4 g/dL (ref 32.0–36.0)
MCV: 97 fL (ref 80.0–100.0)
PLATELETS: 220 10*3/uL (ref 150–440)
RBC: 3.82 MIL/uL (ref 3.80–5.20)
RDW: 13.9 % (ref 11.5–14.5)
WBC: 12.1 10*3/uL — ABNORMAL HIGH (ref 3.6–11.0)

## 2016-01-20 MED ORDER — ACETAMINOPHEN 500 MG PO TABS
1000.0000 mg | ORAL_TABLET | Freq: Once | ORAL | Status: AC
  Administered 2016-01-21: 1000 mg via ORAL
  Filled 2016-01-20: qty 2

## 2016-01-20 NOTE — Discharge Instructions (Signed)
As we discussed, though you do have high blood pressure (hypertension), fortunately it is not immediately dangerous at this time and does not need emergency intervention or admission to the hospital.  If we add to or change your regular medications, we may cause more harm than good - it is more appropriate for your primary care doctor to evaluate you in clinic and decide if any medication changes are needed.  Please follow up in clinic as recommended in these papers.    Return to the Emergency Department (ED) if you experience any worsening chest pain/pressure/tightness, difficulty breathing, or sudden sweating, or other symptoms that concern you.   Hypertension Hypertension, commonly called high blood pressure, is when the force of blood pumping through your arteries is too strong. Your arteries are the blood vessels that carry blood from your heart throughout your body. A blood pressure reading consists of a higher number over a lower number, such as 110/72. The higher number (systolic) is the pressure inside your arteries when your heart pumps. The lower number (diastolic) is the pressure inside your arteries when your heart relaxes. Ideally you want your blood pressure below 120/80. Hypertension forces your heart to work harder to pump blood. Your arteries may become narrow or stiff. Having hypertension puts you at risk for heart disease, stroke, and other problems.  RISK FACTORS Some risk factors for high blood pressure are controllable. Others are not.  Risk factors you cannot control include:   Race. You may be at higher risk if you are African American.  Age. Risk increases with age.  Gender. Men are at higher risk than women before age 43 years. After age 55, women are at higher risk than men. Risk factors you can control include:  Not getting enough exercise or physical activity.  Being overweight.  Getting too much fat, sugar, calories, or salt in your diet.  Drinking too much  alcohol. SIGNS AND SYMPTOMS Hypertension does not usually cause signs or symptoms. Extremely high blood pressure (hypertensive crisis) may cause headache, anxiety, shortness of breath, and nosebleed. DIAGNOSIS  To check if you have hypertension, your health care provider will measure your blood pressure while you are seated, with your arm held at the level of your heart. It should be measured at least twice using the same arm. Certain conditions can cause a difference in blood pressure between your right and left arms. A blood pressure reading that is higher than normal on one occasion does not mean that you need treatment. If one blood pressure reading is high, ask your health care provider about having it checked again. TREATMENT  Treating high blood pressure includes making lifestyle changes and possibly taking medicine. Living a healthy lifestyle can help lower high blood pressure. You may need to change some of your habits. Lifestyle changes may include:  Following the DASH diet. This diet is high in fruits, vegetables, and whole grains. It is low in salt, red meat, and added sugars.  Getting at least 2 hours of brisk physical activity every week.  Losing weight if necessary.  Not smoking.  Limiting alcoholic beverages.  Learning ways to reduce stress. If lifestyle changes are not enough to get your blood pressure under control, your health care provider may prescribe medicine. You may need to take more than one. Work closely with your health care provider to understand the risks and benefits. HOME CARE INSTRUCTIONS  Have your blood pressure rechecked as directed by your health care provider.   Take medicines  only as directed by your health care provider. Follow the directions carefully. Blood pressure medicines must be taken as prescribed. The medicine does not work as well when you skip doses. Skipping doses also puts you at risk for problems.   Do not smoke.   Monitor your  blood pressure at home as directed by your health care provider. SEEK MEDICAL CARE IF:   You think you are having a reaction to medicines taken.  You have recurrent headaches or feel dizzy.  You have swelling in your ankles.  You have trouble with your vision. SEEK IMMEDIATE MEDICAL CARE IF:  You develop a severe headache or confusion.  You have unusual weakness, numbness, or feel faint.  You have severe chest or abdominal pain.  You vomit repeatedly.  You have trouble breathing. MAKE SURE YOU:   Understand these instructions.  Will watch your condition.  Will get help right away if you are not doing well or get worse. Document Released: 08/19/2005 Document Revised: 01/03/2014 Document Reviewed: 06/11/2013 [facility] Patient Information 2015 [place], [place]. This information is not intended to replace advice given to you by your health care provider. Make sure you discuss any questions you have with your health care provider.  How to Take Your Blood Pressure HOW DO I GET A BLOOD PRESSURE MACHINE?  You can buy an electronic home blood pressure machine at your local pharmacy. Insurance will sometimes cover the cost if you have a prescription.  Ask your doctor what type of machine is best for you. There are different machines for your arm and your wrist.  If you decide to buy a machine to check your blood pressure on your arm, first check the size of your arm so you can buy the right size cuff. To check the size of your arm:   Use a measuring tape that shows both inches and centimeters.   Wrap the measuring tape around the upper-middle part of your arm. You may need someone to help you measure.   Write down your arm measurement in both inches and centimeters.   To measure your blood pressure correctly, it is important to have the right size cuff.   If your arm is up to 13 inches (up to 34 centimeters), get an adult cuff size.  If your arm is 13 to 17 inches (35 to  44 centimeters), get a large adult cuff size.    If your arm is 17 to 20 inches (45 to 52 centimeters), get an adult thigh cuff.  WHAT DO THE NUMBERS MEAN?   There are two numbers that make up your blood pressure. For example: 120/80.  The first number (120 in our example) is called the "systolic pressure." It is a measure of the pressure in your blood vessels when your heart is pumping blood.  The second number (80 in our example) is called the "diastolic pressure." It is a measure of the pressure in your blood vessels when your heart is resting between beats.  Your doctor will tell you what your blood pressure should be. WHAT SHOULD I DO BEFORE I CHECK MY BLOOD PRESSURE?   Try to rest or relax for at least 30 minutes before you check your blood pressure.  Do not smoke.  Do not have any drinks with caffeine, such as:  Soda.  Coffee.  Tea.  Check your blood pressure in a quiet room.  Sit down and stretch out your arm on a table. Keep your arm at about the level  of your heart. Let your arm relax.  Make sure that your legs are not crossed. HOW DO I CHECK MY BLOOD PRESSURE?  Follow the directions that came with your machine.  Make sure you remove any tight-fighting clothing from your arm or wrist. Wrap the cuff around your upper arm or wrist. You should be able to fit a finger between the cuff and your arm. If you cannot fit a finger between the cuff and your arm, it is too tight and should be removed and rewrapped.  Some units require you to manually pump up the arm cuff.  Automatic units inflate the cuff when you press a button.  Cuff deflation is automatic in both models.  After the cuff is inflated, the unit measures your blood pressure and pulse. The readings are shown on a monitor. Hold still and breathe normally while the cuff is inflated.  Getting a reading takes less than a minute.  Some models store readings in a memory. Some provide a printout of readings. If  your machine does not store your readings, keep a written record.  Take readings with you to your next visit with your doctor. Document Released: 08/01/2008 Document Revised: 01/03/2014 Document Reviewed: 10/14/2013 [facility] Patient Information 2015 [place], [place]. This information is not intended to replace advice given to you by your health care provider. Make sure you discuss any questions you have with your health care provider.   DASH Eating Plan DASH stands for "Dietary Approaches to Stop Hypertension." The DASH eating plan is a healthy eating plan that has been shown to reduce high blood pressure (hypertension). Additional health benefits may include reducing the risk of type 2 diabetes mellitus, heart disease, and stroke. The DASH eating plan may also help with weight loss. WHAT DO I NEED TO KNOW ABOUT THE DASH EATING PLAN? For the DASH eating plan, you will follow these general guidelines:  Choose foods with a percent daily value for sodium of less than 5% (as listed on the food label).  Use salt-free seasonings or herbs instead of table salt or sea salt.  Check with your health care provider or pharmacist before using salt substitutes.  Eat lower-sodium products, often labeled as "lower sodium" or "no salt added."  Eat fresh foods.  Eat more vegetables, fruits, and low-fat dairy products.  Choose whole grains. Look for the word "whole" as the first word in the ingredient list.  Choose fish and skinless chicken or [place] more often than red meat. Limit fish, poultry, and meat to 6 oz (170 g) each day.  Limit sweets, desserts, sugars, and sugary drinks.  Choose heart-healthy fats.  Limit cheese to 1 oz (28 g) per day.  Eat more home-cooked food and less restaurant, buffet, and fast food.  Limit fried foods.  Cook foods using methods other than frying.  Limit canned vegetables. If you do use them, rinse them well to decrease the sodium.  When eating at a restaurant,  ask that your food be prepared with less salt, or no salt if possible. WHAT FOODS CAN I EAT? Seek help from a dietitian for individual calorie needs. Grains Whole grain or whole wheat bread. Brown rice. Whole grain or whole wheat pasta. Quinoa, bulgur, and whole grain cereals. Low-sodium cereals. Corn or whole wheat flour tortillas. Whole grain cornbread. Whole grain crackers. Low-sodium crackers. Vegetables Fresh or frozen vegetables (raw, steamed, roasted, or grilled). Low-sodium or reduced-sodium tomato and vegetable juices. Low-sodium or reduced-sodium tomato sauce and paste. Low-sodium or reduced-sodium  canned vegetables.  Fruits All fresh, canned (in natural juice), or frozen fruits. Meat and Other Protein Products Ground beef (85% or leaner), grass-fed beef, or beef trimmed of fat. Skinless chicken or [place]. Ground chicken or [place]. Pork trimmed of fat. All fish and seafood. Eggs. Dried beans, peas, or lentils. Unsalted nuts and seeds. Unsalted canned beans. Dairy Low-fat dairy products, such as skim or 1% milk, 2% or reduced-fat cheeses, low-fat ricotta or cottage cheese, or plain low-fat yogurt. Low-sodium or reduced-sodium cheeses. Fats and Oils Tub margarines without trans fats. Light or reduced-fat mayonnaise and salad dressings (reduced sodium). Avocado. Safflower, olive, or canola oils. Natural peanut or almond butter. Other Unsalted popcorn and pretzels. The items listed above may not be a complete list of recommended foods or beverages. Contact your dietitian for more options. WHAT FOODS ARE NOT RECOMMENDED? Grains White bread. White pasta. White rice. Refined cornbread. Bagels and croissants. Crackers that contain trans fat. Vegetables Creamed or fried vegetables. Vegetables in a cheese sauce. Regular canned vegetables. Regular canned tomato sauce and paste. Regular tomato and vegetable juices. Fruits Dried fruits. Canned fruit in light or heavy syrup. Fruit juice. Meat  and Other Protein Products Fatty cuts of meat. Ribs, chicken wings, bacon, sausage, bologna, salami, chitterlings, fatback, hot dogs, bratwurst, and packaged luncheon meats. Salted nuts and seeds. Canned beans with salt. Dairy Whole or 2% milk, cream, half-and-half, and cream cheese. Whole-fat or sweetened yogurt. Full-fat cheeses or blue cheese. Nondairy creamers and whipped toppings. Processed cheese, cheese spreads, or cheese curds. Condiments Onion and garlic salt, seasoned salt, table salt, and sea salt. Canned and packaged gravies. Worcestershire sauce. Tartar sauce. Barbecue sauce. Teriyaki sauce. Soy sauce, including reduced sodium. Steak sauce. Fish sauce. Oyster sauce. Cocktail sauce. Horseradish. Ketchup and mustard. Meat flavorings and tenderizers. Bouillon cubes. Hot sauce. Tabasco sauce. Marinades. Taco seasonings. Relishes. Fats and Oils Butter, stick margarine, lard, shortening, ghee, and bacon fat. Coconut, palm kernel, or palm oils. Regular salad dressings. Other Pickles and olives. Salted popcorn and pretzels. The items listed above may not be a complete list of foods and beverages to avoid. Contact your dietitian for more information. WHERE CAN I FIND MORE INFORMATION? National Heart, [employer], [employer] Institute: travelstabloid.com   This information is not intended to replace advice given to you by your health care provider. Make sure you discuss any questions you have with your health care provider.   Document Released: 08/08/2011 Document Revised: 09/09/2014 Document Reviewed: 06/23/2013 [employer] Interactive Patient Education [employer].

## 2016-01-20 NOTE — ED Provider Notes (Signed)
[facility] Emergency Department Provider Note  ____________________________________________  Time seen: Approximately 10:55 PM  I have reviewed the triage vital signs and the nursing notes.   HISTORY  Chief Complaint Headache and Hypertension    HPI Kimberly Roy is a 82 y.o. female with past medical history is documented below including difficult to control hypertension on multiple antihypertensive medications.  She has a primary care doctor, Dr. Nicki Reaper, who follows her closely.  The patient reports that for the last couple of weeks she has had higher blood pressure than usual, occasionally nearing 553 systolic.  She has had no symptoms associated with his hypertension except for an occasional mild global aching headache.  She denies fever/chills, visual changes, shortness of breath, chest pain, nausea, vomiting, diarrhea, dysuria.  She has had no weakness in any of her extremities, no difficulty ambulating, no falls.She has tried to touch base with her PCP but her doctor is currently unavailable so she came to the emergency department because she has been carefully checking her blood pressure multiple times a day and is concerned whether this high.  She reports that her mother died of a stroke which the patient believes was a result of high blood pressure.  She reports her blood pressure is severely elevated and nothing is making it better or worse.    Past Medical History  Diagnosis Date  . Hypertension   . Hyperlipidemia   . Hypothyroidism   . Anemia   . Depression   . Anxiety   . Nephrolithiasis   . Interstitial cystitis   . Pulmonary nodules     followed by Dr Raul Del  . Fatty liver   . Renal cyst     right  . Headache   . DVT (deep venous thrombosis) ([place])   . Fatty liver   . Lymphedema   . Chest pain   . Dyspnea   . Left ventricular dysfunction   . Pulmonary hypertension ([place])   . CHF (congestive heart failure) ([place])   . Constipation     . Dysphonia   . Pure hypercholesterolemia   . Hyperpiesia   . Obstructive sleep apnea   . Osteoarthritis     knees/cervical and lumbar spine  . DDD (degenerative disc disease), cervical     Patient Active Problem List   Diagnosis Date Noted  . GERD (gastroesophageal reflux disease) 12/10/2015  . Finger pain 11/13/2015  . Carotid artery calcification 09/26/2015  . External nasal lesion 09/25/2015  . Muscle cramps 09/01/2015  . Excessive sweating 07/30/2015  . Cellulitis of leg, right 07/16/2015  . Headache 07/16/2015  . Groin pain 05/14/2015  . Muscle twitching 03/05/2015  . Leg cramps 02/11/2015  . Back pain 02/11/2015  . Abdominal pain 02/11/2015  . Acute cystitis without hematuria 12/12/2014  . Left elbow pain 11/30/2014  . Health care maintenance 11/30/2014  . Osteoporosis 10/19/2014  . Rectal bleeding 10/19/2014  . Obesity (BMI 30-39.9) 09/03/2014  . Neck pain 09/03/2014  . Unsteady gait 09/03/2014  . Nocturia 09/03/2014  . Dysphagia 06/01/2014  . Nasal dryness 06/01/2014  . Stress 06/01/2014  . Obesity 02/27/2014  . Fatigue 02/27/2014  . Pre-op evaluation 10/12/2013  . Hoarseness 08/14/2013  . Leg swelling 01/24/2013  . CHF (congestive heart failure) ([place]) 12/29/2012  . Cough 12/29/2012  . Chest pain 12/29/2012  . OSA (obstructive sleep apnea) 07/09/2012  . Osteoarthritis 07/04/2012  . Anemia 07/04/2012  . Chronic constipation 07/04/2012  . Pulmonary hypertension ([place]) 07/04/2012  .  Pulmonary nodules 07/04/2012  . Hypertension 07/04/2012  . Hypercholesterolemia 07/04/2012  . Hypothyroidism 07/04/2012    Past Surgical History  Procedure Laterality Date  . Breast reduction surgery      3/99  . Back surgeries    . Appendectomy    . Cervical spine surgery    . Tonsilectomy/adenoidectomy with myringotomy    . Excisional hemorrhoidectomy    . Rotator cuff surgery      blilateral  . Knee arthroscopy      left and right  . Abdominal hysterectomy       ovaries left in place  . Cataracts Bilateral   . Hemorrhoid surgery    . Esophagogastroduodenoscopy N/A 02/27/2015    Procedure: ESOPHAGOGASTRODUODENOSCOPY (EGD);  Surgeon: Hulen Luster, MD;  Location: [facility] ENDOSCOPY;  Service: Gastroenterology;  Laterality: N/A;  . Hip surgery  2013    Right hip surgery  . Replacement total knee Bilateral   . Cardiac catheterization    . Esophageal manometry N/A 08/02/2015    Procedure: ESOPHAGEAL MANOMETRY (EM);  Surgeon: Josefine Class, MD;  Location: [facility] ENDOSCOPY;  Service: Endoscopy;  Laterality: N/A;  . Reduction mammaplasty Bilateral     Current Outpatient Rx  Name  Route  Sig  Dispense  Refill  . aluminum-magnesium hydroxide-simethicone (MAALOX) 595-638-75 MG/5ML SUSP   Oral   Take by mouth.         Marland Kitchen amLODipine (NORVASC) 10 MG tablet   Oral   Take 10 mg by mouth daily.         Marland Kitchen atorvastatin (LIPITOR) 20 MG tablet      TAKE ONE TABLET AT BEDTIME   90 tablet   3   . benazepril (LOTENSIN) 40 MG tablet      TAKE ONE (1) TABLET EACH DAY   90 tablet   3   . Calcium-Magnesium-Vitamin D 600-40-500 MG-MG-UNIT TB24   Oral   Take 630 mg by mouth 4 (four) times daily.         [name] Sodium 30-100 MG CAPS   Oral   Take by mouth.         . cephALEXin (KEFLEX) 250 MG capsule   Oral   Take 250 mg by mouth daily.         . Cholecalciferol (VITAMIN D3) 1000 UNITS CAPS   Oral   Take by mouth.         . DULoxetine (CYMBALTA) 60 MG capsule   Oral   Take 60 mg by mouth daily.         . fluticasone (FLONASE) 50 MCG/ACT nasal spray   Nasal   Place 2 sprays into the nose daily.         . furosemide (LASIX) 20 MG tablet      TAKE ONE (1) TABLET EACH DAY AS NEEDED   30 tablet   3   . gabapentin (NEURONTIN) 300 MG capsule   Oral   Take 300 mg by mouth 3 (three) times daily.         . hydrochlorothiazide (HYDRODIURIL) 25 MG tablet      TAKE ONE (1) TABLET EACH DAY   90 tablet   1   .  HYDROcodone-acetaminophen (NORCO) 10-325 MG per tablet   Oral   Take 1 tablet by mouth every 4 (four) hours as needed.         . Inulin (FIBER CHOICE PO)   Oral   Take 2 tablets by mouth daily. Chew tablets         .  magnesium oxide (MAG-OX) 400 MG tablet   Oral   Take 1 tablet (400 mg total) by mouth daily.   30 tablet   1   . metoprolol succinate (TOPROL-XL) 100 MG 24 hr tablet      TAKE ONE TABLET TWICE DAILY. TAKE WITH OR IMMEDIATELY FOLLOWING A MEAL   180 tablet   3   . morphine (MS CONTIN) 30 MG 12 hr tablet   Oral   Take 30 mg by mouth daily. Takes around midday         . morphine (MS CONTIN) 60 MG 12 hr tablet   Oral   Take 60 mg by mouth 2 (two) times daily.          Marland Kitchen nystatin cream (MYCOSTATIN)   Topical   Apply topically 2 (two) times daily.   30 g   1   . ondansetron (ZOFRAN-ODT) 4 MG disintegrating tablet   Oral   Take 4 mg by mouth every 8 (eight) hours as needed for nausea or vomiting.         . pantoprazole (PROTONIX) 40 MG tablet      TAKE ONE TABLET TWICE DAILY   60 tablet   11   . polyethylene glycol powder (GLYCOLAX/MIRALAX) powder      MIX 17 GRAMS (AS MARKED IN BOTTLE TOP) IN 8 OUNCES OF WATER, MIX AND DRINK ONCEA DAY AS DIRECTED.   527 g   2   . Probiotic Product (ALIGN PO)   Oral   Take by mouth daily.         . ranitidine (ZANTAC) 150 MG tablet   Oral   Take 150 mg by mouth 2 (two) times daily.         . Simethicone (GAS-X PO)   Oral   Take by mouth.         . sodium chloride (OCEAN) 0.65 % nasal spray   Nasal   Place 1 spray into the nose as needed.         Marland Kitchen SYNTHROID 112 MCG tablet      TAKE ONE (1) TABLET EACH DAY   90 tablet   3     Dispense as written.   Marland Kitchen amLODipine (NORVASC) 5 MG tablet      TAKE 1 TABLET BY MOUTH EVERY DAY. Patient not taking: Reported on 01/20/2016   90 tablet   2     Allergies Lyrica; Atarax; Dicyclomine; Hydroxyzine hcl; Levaquin; Nucynta er; Oxybutynin; Zoloft;  Biaxin; Sertraline; Sulfa antibiotics; Tape; and Tapentadol  Family History  Problem Relation Age of Onset  . Heart disease Mother   . Stroke Mother   . Heart disease Father     myocardial infarction age 25  . Hypertension Mother     Social History Social History  Substance Use Topics  . Smoking status: Never Smoker   . Smokeless tobacco: Never Used  . Alcohol Use: No    Review of Systems Constitutional: No fever/chills Eyes: No visual changes. ENT: No sore throat. Cardiovascular: Denies chest pain. Respiratory: Denies shortness of breath. Gastrointestinal: No abdominal pain.  No nausea, no vomiting.  No diarrhea.  No constipation. Genitourinary: Negative for dysuria. Musculoskeletal: Negative for back pain. Skin: Negative for rash. Neurological: Occasional mild global headache.  10-point ROS otherwise negative.  ____________________________________________   PHYSICAL EXAM:  VITAL SIGNS: ED Triage Vitals  Enc Vitals Group     BP 01/20/16 2039 178/52 mmHg     Pulse Rate 01/20/16 2039  57     Resp 01/20/16 2039 18     Temp 01/20/16 2039 97.6 F (36.4 C)     Temp Source 01/20/16 2039 Oral     SpO2 01/20/16 2039 99 %     Weight 01/20/16 2039 206 lb (93.441 kg)     Height 01/20/16 2039 _0  (1.575 m)     Head Cir --      Peak Flow --      Pain Score 01/20/16 2040 6     Pain Loc --      Pain Edu? --      Excl. in [place]? --     Constitutional: Alert and oriented. Well appearing and in no acute distress. Eyes: Conjunctivae are normal. PERRL. EOMI. Head: Atraumatic. Nose: No congestion/rhinnorhea. Mouth/Throat: Mucous membranes are moist.  Oropharynx non-erythematous. Neck: No stridor.  No meningeal signs.   Cardiovascular: Normal rate, regular rhythm. Good peripheral circulation. Grossly normal heart sounds.   Respiratory: Normal respiratory effort.  No retractions. Lungs CTAB. Gastrointestinal: Soft and nontender. No distention.  Musculoskeletal: No lower  extremity tenderness nor edema. No gross deformities of extremities. Neurologic:  Normal speech and language. No gross focal neurologic deficits are appreciated.  Skin:  Skin is warm, dry and intact. No rash noted. Psychiatric: Mood and affect are normal. Speech and behavior are normal.  ____________________________________________   LABS (all labs ordered are listed, but only abnormal results are displayed)  Labs Reviewed  CBC - Abnormal; Notable for the following:    WBC 12.1 (*)    All other components within normal limits  BASIC METABOLIC PANEL - Abnormal; Notable for the following:    Chloride 96 (*)    Glucose, Bld 123 (*)    BUN 24 (*)    GFR calc non Af Amer 55 (*)    All other components within normal limits  URINALYSIS COMPLETEWITH MICROSCOPIC ([facility] ONLY) - Abnormal; Notable for the following:    Color, Urine STRAW (*)    APPearance CLEAR (*)    Squamous Epithelial / LPF 0-5 (*)    All other components within normal limits   ____________________________________________  EKG  ED ECG REPORT I, Valorie Mcgrory, the attending physician, personally viewed and interpreted this ECG.  Date: 01/20/2016 EKG Time: 21:02 Rate: 52 Rhythm: Sinus bradycardia QRS Axis: normal Intervals: normal ST/T Wave abnormalities: Inverted T-wave in lead 3, V2, V3, all of which were present in prior EKGs from months ago.  No ST depression or elevation. Conduction Disturbances: none Narrative Interpretation: Evidence of acute ischemia.  No significant change from prior.  ____________________________________________  RADIOLOGY   Ct Head Wo Contrast  01/20/2016  CLINICAL DATA:  High blood pressure and headache for a week. EXAM: CT HEAD WITHOUT CONTRAST TECHNIQUE: Contiguous axial images were obtained from the base of the skull through the vertex without intravenous contrast. COMPARISON:  08/18/2015 FINDINGS: Diffuse cerebral atrophy. No ventricular dilatation. Patchy low-attenuation changes  in the periventricular white matter consistent with small vessel ischemia. No mass effect or midline shift. No abnormal extra-axial fluid collections. Gray-white matter junctions are distinct. Basal cisterns are not effaced. No evidence of acute intracranial hemorrhage. No depressed skull fractures. Mucous retention cyst in the sphenoid sinus. Paranasal sinuses and mastoid air cells are otherwise clear. IMPRESSION: No acute intracranial abnormalities. Mild chronic atrophy and small vessel ischemic changes. Electronically Signed   By: Lucienne Capers M.D.   On: 01/20/2016 22:46    ____________________________________________   PROCEDURES  Procedure(s) performed: None  Critical Care performed: No ____________________________________________   INITIAL IMPRESSION / ASSESSMENT AND PLAN / ED COURSE  Pertinent labs & imaging results that were available during my care of the patient were reviewed by me and considered in my medical decision making (see chart for details).  The patient is asymptomatic other than an occasional headache which is currently not present.  She has a normal and reassuring workup today.  Her labs are essentially normal except for a very mild and nonspecific leukocytosis.  She has a normal noncontrast head CT, no EKG changes, no evidence of acute kidney injury.  I had my usual and customary asymptomatic hypertension discussion with the patient including why I  believe that aggressive intervention wall to her more harm than good.  She is comfortable with a reassuring screening exam today and plans to follow-up with her primary care doctor.  ----------------------------------------- 11:34 PM on 01/20/2016 -----------------------------------------  She developed a headache before I went back to talk to her again.  However, I still feel this is likely due to secondary causes and is not results of her blood pressure and everything I said above is still true, I can do more harm  than good.  I reiterated all of this with her and she understands and agrees to outpatient follow-up.  I have reviewed her medications and her amlodipine and benazepril and hydrochlorothiazide RL at maximal therapeutic doses.  Her metoprolol succinate is at 100 mg daily which could in theory be increased, but she is already bradycardic in the 50s and I am not comfortable modify her medications at this time.  I explained this to her and she understands.   ____________________________________________  FINAL CLINICAL IMPRESSION(S) / ED DIAGNOSES  Final diagnoses:  Essential hypertension     MEDICATIONS GIVEN DURING THIS VISIT:  Medications  acetaminophen (TYLENOL) tablet 1,000 mg (1,000 mg Oral Given 01/21/16 0003)     NEW OUTPATIENT MEDICATIONS STARTED DURING THIS VISIT:  Discharge Medication List as of 01/20/2016 11:36 PM        Note:  This document was prepared using Dragon voice recognition software and may include unintentional dictation errors.   Hinda Kehr, MD 01/21/16 [phone]

## 2016-01-20 NOTE — ED Notes (Signed)
Patient left for CT scan.

## 2016-01-20 NOTE — ED Notes (Addendum)
Pt via POV with family c/o BP systolic 481 at home today and HA ("all over pounding") for the last week.  Pt and family deny facial droop or speech deficits.  Rapid NIH scan reveals no deficits.   Pt denies chest pain/N/V/D/dysuria/loss of vision.    Pt has pain stimulator implanted for chronic back pain.

## 2016-01-21 ENCOUNTER — Encounter: Payer: Self-pay | Admitting: Internal Medicine

## 2016-01-22 NOTE — Telephone Encounter (Signed)
Need to know how blood pressures are running now.  What is average blood pressure.  Also, avoid increased salt intake, etc.

## 2016-01-23 ENCOUNTER — Encounter: Payer: Self-pay | Admitting: Internal Medicine

## 2016-01-23 DIAGNOSIS — Z9889 Other specified postprocedural states: Secondary | ICD-10-CM | POA: Diagnosis not present

## 2016-01-23 DIAGNOSIS — G4733 Obstructive sleep apnea (adult) (pediatric): Secondary | ICD-10-CM | POA: Diagnosis not present

## 2016-01-23 DIAGNOSIS — I272 Other secondary pulmonary hypertension: Secondary | ICD-10-CM | POA: Diagnosis not present

## 2016-01-23 DIAGNOSIS — I5032 Chronic diastolic (congestive) heart failure: Secondary | ICD-10-CM | POA: Diagnosis not present

## 2016-01-23 DIAGNOSIS — E7800 Pure hypercholesterolemia, unspecified: Secondary | ICD-10-CM | POA: Diagnosis not present

## 2016-01-23 DIAGNOSIS — I10 Essential (primary) hypertension: Secondary | ICD-10-CM | POA: Diagnosis not present

## 2016-01-23 DIAGNOSIS — I519 Heart disease, unspecified: Secondary | ICD-10-CM | POA: Diagnosis not present

## 2016-01-23 DIAGNOSIS — R0609 Other forms of dyspnea: Secondary | ICD-10-CM | POA: Diagnosis not present

## 2016-01-29 DIAGNOSIS — J449 Chronic obstructive pulmonary disease, unspecified: Secondary | ICD-10-CM | POA: Diagnosis not present

## 2016-01-30 DIAGNOSIS — F330 Major depressive disorder, recurrent, mild: Secondary | ICD-10-CM | POA: Diagnosis not present

## 2016-01-30 DIAGNOSIS — F5105 Insomnia due to other mental disorder: Secondary | ICD-10-CM | POA: Diagnosis not present

## 2016-01-30 DIAGNOSIS — F419 Anxiety disorder, unspecified: Secondary | ICD-10-CM | POA: Diagnosis not present

## 2016-02-05 ENCOUNTER — Other Ambulatory Visit: Payer: Self-pay | Admitting: Internal Medicine

## 2016-02-12 ENCOUNTER — Other Ambulatory Visit
Admission: RE | Admit: 2016-02-12 | Discharge: 2016-02-12 | Disposition: A | Discharge status: Home / Self Care | Payer: PPO | Source: Ambulatory Visit | Attending: Internal Medicine | Admitting: Internal Medicine

## 2016-02-12 ENCOUNTER — Encounter: Payer: Self-pay | Admitting: Internal Medicine

## 2016-02-12 ENCOUNTER — Ambulatory Visit (INDEPENDENT_AMBULATORY_CARE_PROVIDER_SITE_OTHER): Payer: PPO | Admitting: Internal Medicine

## 2016-02-12 VITALS — BP 134/68 | HR 62 | Temp 98.5°F | Ht 62.0 in | Wt 213.0 lb

## 2016-02-12 DIAGNOSIS — E039 Hypothyroidism, unspecified: Secondary | ICD-10-CM

## 2016-02-12 DIAGNOSIS — D649 Anemia, unspecified: Secondary | ICD-10-CM | POA: Diagnosis not present

## 2016-02-12 DIAGNOSIS — K625 Hemorrhage of anus and rectum: Secondary | ICD-10-CM

## 2016-02-12 DIAGNOSIS — G4733 Obstructive sleep apnea (adult) (pediatric): Secondary | ICD-10-CM

## 2016-02-12 DIAGNOSIS — R258 Other abnormal involuntary movements: Secondary | ICD-10-CM

## 2016-02-12 DIAGNOSIS — R5383 Other fatigue: Secondary | ICD-10-CM

## 2016-02-12 DIAGNOSIS — K219 Gastro-esophageal reflux disease without esophagitis: Secondary | ICD-10-CM

## 2016-02-12 DIAGNOSIS — M7989 Other specified soft tissue disorders: Secondary | ICD-10-CM

## 2016-02-12 DIAGNOSIS — R253 Fasciculation: Secondary | ICD-10-CM

## 2016-02-12 DIAGNOSIS — I10 Essential (primary) hypertension: Secondary | ICD-10-CM | POA: Diagnosis not present

## 2016-02-12 LAB — BASIC METABOLIC PANEL
ANION GAP: 8 (ref 5–15)
BUN: 17 mg/dL (ref 6–20)
CHLORIDE: 98 mmol/L — AB (ref 101–111)
CO2: 31 mmol/L (ref 22–32)
Calcium: 9.5 mg/dL (ref 8.9–10.3)
Creatinine, Ser: 0.84 mg/dL (ref 0.44–1.00)
Glucose, Bld: 94 mg/dL (ref 65–99)
POTASSIUM: 4 mmol/L (ref 3.5–5.1)
SODIUM: 137 mmol/L (ref 135–145)

## 2016-02-12 LAB — CBC WITH DIFFERENTIAL/PLATELET
BASOS PCT: 0 %
Basophils Absolute: 0 10*3/uL (ref 0–0.1)
EOS ABS: 0.2 10*3/uL (ref 0–0.7)
Eosinophils Relative: 3 %
HEMATOCRIT: 33.1 % — AB (ref 35.0–47.0)
HEMOGLOBIN: 11.1 g/dL — AB (ref 12.0–16.0)
Lymphocytes Relative: 27 %
Lymphs Abs: 1.6 10*3/uL (ref 1.0–3.6)
MCH: 32.5 pg (ref 26.0–34.0)
MCHC: 33.5 g/dL (ref 32.0–36.0)
MCV: 97 fL (ref 80.0–100.0)
MONOS PCT: 8 %
Monocytes Absolute: 0.5 10*3/uL (ref 0.2–0.9)
NEUTROS ABS: 3.8 10*3/uL (ref 1.4–6.5)
NEUTROS PCT: 62 %
Platelets: 144 10*3/uL — ABNORMAL LOW (ref 150–440)
RBC: 3.41 MIL/uL — AB (ref 3.80–5.20)
RDW: 14.3 % (ref 11.5–14.5)
WBC: 6.1 10*3/uL (ref 3.6–11.0)

## 2016-02-12 LAB — HEPATIC FUNCTION PANEL
ALBUMIN: 3.8 g/dL (ref 3.5–5.0)
ALK PHOS: 68 U/L (ref 38–126)
ALT: 16 U/L (ref 14–54)
AST: 20 U/L (ref 15–41)
Bilirubin, Direct: <0.1 mg/dL — ABNORMAL LOW (ref 0.1–0.5)
TOTAL PROTEIN: 6.6 g/dL (ref 6.5–8.1)
Total Bilirubin: 0.4 mg/dL (ref 0.3–1.2)

## 2016-02-12 LAB — TSH: TSH: 1.216 u[IU]/mL (ref 0.350–4.500)

## 2016-02-12 NOTE — Progress Notes (Signed)
Patient ID: Kimberly Roy, female   DOB: 14-Dec-1930, 82 y.o.   MRN: [id number]   Subjective:    Patient ID: Kimberly Roy, female    DOB: 09/24/1930, 82 y.o.   MRN: [id number]  HPI  Patient here for a scheduled follow up.  She reports not feeling well lately.  Decreased energy.  Increased fatigue.  She is still having the jerking movements.  Occur intermittently.  No triggers.  Appear to mostly involve her upper extremities.  Due to f/u with Dr Manuella Ghazi tomorrow.  Left arm worse than right.  She has been having issues with varying blood pressures.  Evaluated in ER.  Also saw cardiology.  Was placed on lasix daily.  Has decreased her sodium intake.  She reports since being on the daily lasix, this has aggravated her bladder issues.  She is having to go to the bathroom multiple times and also report aggravates her bladder - discomfort.  Did not take today.  No pain today. She is sleeping ok.  Breathing stable.  Does report over the last few days has noticed blood in the stool  Today, noticed increased blood in the toilet after urinating - rectal bleeding.  No active bleeding now.    Past Medical History  Diagnosis Date  . Hypertension   . Hyperlipidemia   . Hypothyroidism   . Anemia   . Depression   . Anxiety   . Nephrolithiasis   . Interstitial cystitis   . Pulmonary nodules     followed by Dr Raul Del  . Fatty liver   . Renal cyst     right  . Headache   . DVT (deep venous thrombosis) ([place])   . Fatty liver   . Lymphedema   . Chest pain   . Dyspnea   . Left ventricular dysfunction   . Pulmonary hypertension ([place])   . CHF (congestive heart failure) ([place])   . Constipation   . Dysphonia   . Pure hypercholesterolemia   . Hyperpiesia   . Obstructive sleep apnea   . Osteoarthritis     knees/cervical and lumbar spine  . DDD (degenerative disc disease), cervical    Past Surgical History  Procedure Laterality Date  . Breast reduction surgery      3/99  . Back surgeries    . Appendectomy      . Cervical spine surgery    . Tonsilectomy/adenoidectomy with myringotomy    . Excisional hemorrhoidectomy    . Rotator cuff surgery      blilateral  . Knee arthroscopy      left and right  . Abdominal hysterectomy      ovaries left in place  . Cataracts Bilateral   . Hemorrhoid surgery    . Esophagogastroduodenoscopy N/A 02/27/2015    Procedure: ESOPHAGOGASTRODUODENOSCOPY (EGD);  Surgeon: Hulen Luster, MD;  Location: [facility] ENDOSCOPY;  Service: Gastroenterology;  Laterality: N/A;  . Hip surgery  2013    Right hip surgery  . Replacement total knee Bilateral   . Cardiac catheterization    . Esophageal manometry N/A 08/02/2015    Procedure: ESOPHAGEAL MANOMETRY (EM);  Surgeon: Josefine Class, MD;  Location[facility] ENDOSCOPY;  Service: Endoscopy;  Laterality: N/A;  . Reduction mammaplasty Bilateral    Family History  Problem Relation Age of Onset  . Heart disease Mother   . Stroke Mother   . Heart disease Father     myocardial infarction age 67  . Hypertension Mother    Social History  Social History  . Marital Status: Married    Spouse Name: N/A  . Number of Children: 2  . Years of Education: N/A   Social History Main Topics  . Smoking status: Never Smoker   . Smokeless tobacco: Never Used  . Alcohol Use: No  . Drug Use: No  . Sexual Activity: Not Currently   Other Topics Concern  . None   Social History Narrative    Outpatient Encounter Prescriptions as of 02/12/2016  Medication Sig  . aluminum-magnesium hydroxide-simethicone (MAALOX) 063-016-01 MG/5ML SUSP Take by mouth.  Marland Kitchen amLODipine (NORVASC) 10 MG tablet Take 10 mg by mouth daily.  Marland Kitchen atorvastatin (LIPITOR) 20 MG tablet TAKE ONE TABLET AT BEDTIME  . benazepril (LOTENSIN) 40 MG tablet TAKE ONE (1) TABLET EACH DAY  . Calcium-Magnesium-Vitamin D 600-40-500 MG-MG-UNIT TB24 Take 630 mg by mouth 4 (four) times daily.  [name] Sodium 30-100 MG CAPS Take by mouth.  . cephALEXin (KEFLEX) 250 MG  capsule Take 250 mg by mouth daily.  . Cholecalciferol (VITAMIN D3) 1000 UNITS CAPS Take by mouth.  . DULoxetine (CYMBALTA) 60 MG capsule Take 60 mg by mouth daily.  . fluticasone (FLONASE) 50 MCG/ACT nasal spray Place 2 sprays into the nose daily.  . furosemide (LASIX) 20 MG tablet TAKE ONE (1) TABLET EACH DAY AS NEEDED  . gabapentin (NEURONTIN) 300 MG capsule Take 300 mg by mouth 3 (three) times daily.  . hydrochlorothiazide (HYDRODIURIL) 25 MG tablet TAKE ONE (1) TABLET EACH DAY  . HYDROcodone-acetaminophen (NORCO) 10-325 MG per tablet Take 1 tablet by mouth every 4 (four) hours as needed.  . Inulin (FIBER CHOICE PO) Take 2 tablets by mouth daily. Chew tablets  . magnesium oxide (MAG-OX) 400 MG tablet Take 1 tablet (400 mg total) by mouth daily.  . metoprolol succinate (TOPROL-XL) 100 MG 24 hr tablet TAKE ONE TABLET TWICE DAILY. TAKE WITH OR IMMEDIATELY FOLLOWING A MEAL  . morphine (MS CONTIN) 30 MG 12 hr tablet Take 30 mg by mouth daily. Takes around midday  . morphine (MS CONTIN) 60 MG 12 hr tablet Take 60 mg by mouth 2 (two) times daily.   Marland Kitchen nystatin cream (MYCOSTATIN) Apply topically 2 (two) times daily.  . ondansetron (ZOFRAN-ODT) 4 MG disintegrating tablet Take 4 mg by mouth every 8 (eight) hours as needed for nausea or vomiting.  . pantoprazole (PROTONIX) 40 MG tablet TAKE ONE TABLET TWICE DAILY  . polyethylene glycol powder (GLYCOLAX/MIRALAX) powder MIX 17 GRAMS (AS MARKED IN BOTTLE TOP) IN 8 OUNCES OF WATER, MIX AND DRINK ONCEA DAY AS DIRECTED.  . Probiotic Product (ALIGN PO) Take by mouth daily.  . ranitidine (ZANTAC) 150 MG tablet Take 150 mg by mouth 2 (two) times daily.  . Simethicone (GAS-X PO) Take by mouth.  . sodium chloride (OCEAN) 0.65 % nasal spray Place 1 spray into the nose as needed.  Marland Kitchen SYNTHROID 112 MCG tablet TAKE ONE (1) TABLET EACH DAY  . [DISCONTINUED] amLODipine (NORVASC) 5 MG tablet TAKE 1 TABLET BY MOUTH EVERY DAY.   No facility-administered encounter  medications on file as of 02/12/2016.    Review of Systems  Constitutional: Positive for fatigue. Negative for appetite change.  HENT: Negative for congestion and sinus pressure.   Respiratory: Negative for cough, chest tightness and shortness of breath.   Cardiovascular: Negative for chest pain and palpitations.       Swelling improved.  Compression hose in place.   Gastrointestinal: Positive for blood in stool (and rectal bleeding. ).  Negative for nausea, vomiting and abdominal pain.  Genitourinary: Positive for frequency.       Increased discomfort with lasix.   Musculoskeletal: Positive for back pain (chronic. ). Negative for joint swelling.  Skin: Negative for color change and rash.  Neurological: Negative for dizziness and headaches.  Psychiatric/Behavioral: Negative for dysphoric mood and agitation.       Objective:     Blood pressure rechecked by me:  134/68  Physical Exam  Constitutional: She appears well-developed and well-nourished. No distress.  Neck: Neck supple. No thyromegaly present.  Cardiovascular: Normal rate and regular rhythm.   Pulmonary/Chest: Breath sounds normal. No respiratory distress. She has no wheezes.  Abdominal: Soft. Bowel sounds are normal. There is no tenderness.  Genitourinary:  Rectal exam - no gross blood.  Heme positive.   Musculoskeletal: She exhibits no tenderness.  No increased edema. Compression hose in place.   Lymphadenopathy:    She has no cervical adenopathy.  Skin: No rash noted. No erythema.  Psychiatric: She has a normal mood and affect. Her behavior is normal.    BP 134/68 mmHg  Pulse 62  Temp(Src) 98.5 F (36.9 C) (Oral)  Ht _0  (1.575 m)  Wt 213 lb (96.616 kg)  BMI 38.95 kg/m2  SpO2 96% Wt Readings from Last 3 Encounters:  02/12/16 213 lb (96.616 kg)  01/20/16 206 lb (93.441 kg)  12/05/15 212 lb 12 oz (96.503 kg)     Lab Results  Component Value Date   WBC 6.1 02/12/2016   HGB 11.1* 02/12/2016   HCT 33.1*  02/12/2016   PLT 144* 02/12/2016   GLUCOSE 94 02/12/2016   CHOL 132 10/03/2015   TRIG 72.0 10/03/2015   HDL 45.50 10/03/2015   LDLCALC 72 10/03/2015   ALT 16 02/12/2016   AST 20 02/12/2016   NA 137 02/12/2016   K 4.0 02/12/2016   CL 98* 02/12/2016   CREATININE 0.84 02/12/2016   BUN 17 02/12/2016   CO2 31 02/12/2016   TSH 1.216 02/12/2016   INR 1.0 12/08/2013   HGBA1C 6.0 10/19/2014    Ct Head Wo Contrast  01/20/2016  CLINICAL DATA:  High blood pressure and headache for a week. EXAM: CT HEAD WITHOUT CONTRAST TECHNIQUE: Contiguous axial images were obtained from the base of the skull through the vertex without intravenous contrast. COMPARISON:  08/18/2015 FINDINGS: Diffuse cerebral atrophy. No ventricular dilatation. Patchy low-attenuation changes in the periventricular white matter consistent with small vessel ischemia. No mass effect or midline shift. No abnormal extra-axial fluid collections. Gray-white matter junctions are distinct. Basal cisterns are not effaced. No evidence of acute intracranial hemorrhage. No depressed skull fractures. Mucous retention cyst in the sphenoid sinus. Paranasal sinuses and mastoid air cells are otherwise clear. IMPRESSION: No acute intracranial abnormalities. Mild chronic atrophy and small vessel ischemic changes. Electronically Signed   By: Lucienne Capers M.D.   On: 01/20/2016 22:46       Assessment & Plan:   Problem List Items Addressed This Visit    Anemia    Has a history of anemia.  With the rectal bleeding and increased fatigue, recheck cbc.  Has seen GI previously.  No gross blood on exam.  Heme positive.  Increased bleeding earlier today.  Question if hemorrhoidal bleeding.  No external hemorrhoid identified on exam.  Will contact GI for reevaluation.  She declines ER evaluation.        Fatigue    Felt to be multifactorial.  Stop lasix.  Check cbc, met  b and tsh.  Restart CPAP.  Just evaluated by cardiology.        GERD (gastroesophageal  reflux disease)    No upper symptoms reported.        Hypertension    Has had varying blood pressures recently.  Outside checks recently averaging 110-150/60s.  She feels worse since starting lasix daily.  Did not take today.  Hold lasix for now.  Blood pressure as outlined today.  Hold on additional medication.  Follow pressures.  Check metabolic panel.        Hypothyroidism    On thyroid replacement.  Check tsh today.        Leg swelling    Better.  Continue compression hose.       Muscle twitching    Reports persistent muscle jerking as outlined.  Unclear etiology.  No triggers.  Due to f/u with Dr Manuella Ghazi tomorrow.        OSA (obstructive sleep apnea)    She is not using CPAP.  Restart.  Discussed importance of treating sleep apnea.       Rectal bleeding - Primary    Bleeding as outlined.  Heme positive on exam.  No gross blood noted.  Increased bleeding earlier today.  Check cbc.  annusol HC suppositories as directed.  Refer to GI for reevaluation.        Relevant Orders   CBC with Differential/Platelet (Completed)   TSH (Completed)   Basic metabolic panel (Completed)   Hepatic function panel (Completed)   Ambulatory referral to Gastroenterology     I spent 40 minutes with the patient and more than 50% of the time was spent in consultation regarding the above.     Einar Pheasant, MD

## 2016-02-12 NOTE — Patient Instructions (Signed)
Stop lasix

## 2016-02-12 NOTE — Progress Notes (Signed)
Pre visit review using our clinic review tool, if applicable. No additional management support is needed unless otherwise documented below in the visit note. 

## 2016-02-13 ENCOUNTER — Encounter: Payer: Self-pay | Admitting: Internal Medicine

## 2016-02-13 DIAGNOSIS — R9401 Abnormal electroencephalogram [EEG]: Secondary | ICD-10-CM | POA: Diagnosis not present

## 2016-02-13 DIAGNOSIS — G253 Myoclonus: Secondary | ICD-10-CM | POA: Diagnosis not present

## 2016-02-13 DIAGNOSIS — S0990XD Unspecified injury of head, subsequent encounter: Secondary | ICD-10-CM | POA: Diagnosis not present

## 2016-02-13 NOTE — Assessment & Plan Note (Signed)
Better.  Continue compression hose.

## 2016-02-13 NOTE — Assessment & Plan Note (Signed)
Reports persistent muscle jerking as outlined.  Unclear etiology.  No triggers.  Due to f/u with Dr Manuella Ghazi tomorrow.

## 2016-02-13 NOTE — Assessment & Plan Note (Signed)
No upper symptoms reported.

## 2016-02-13 NOTE — Assessment & Plan Note (Signed)
On thyroid replacement.  Check tsh today.

## 2016-02-13 NOTE — Assessment & Plan Note (Signed)
Felt to be multifactorial.  Stop lasix.  Check cbc, met b and tsh.  Restart CPAP.  Just evaluated by cardiology.

## 2016-02-13 NOTE — Assessment & Plan Note (Signed)
Has had varying blood pressures recently.  Outside checks recently averaging 110-150/60s.  She feels worse since starting lasix daily.  Did not take today.  Hold lasix for now.  Blood pressure as outlined today.  Hold on additional medication.  Follow pressures.  Check metabolic panel.

## 2016-02-13 NOTE — Assessment & Plan Note (Signed)
Bleeding as outlined.  Heme positive on exam.  No gross blood noted.  Increased bleeding earlier today.  Check cbc.  annusol HC suppositories as directed.  Refer to GI for reevaluation.

## 2016-02-13 NOTE — Assessment & Plan Note (Signed)
Has a history of anemia.  With the rectal bleeding and increased fatigue, recheck cbc.  Has seen GI previously.  No gross blood on exam.  Heme positive.  Increased bleeding earlier today.  Question if hemorrhoidal bleeding.  No external hemorrhoid identified on exam.  Will contact GI for reevaluation.  She declines ER evaluation.

## 2016-02-13 NOTE — Assessment & Plan Note (Signed)
She is not using CPAP.  Restart.  Discussed importance of treating sleep apnea.

## 2016-02-14 DIAGNOSIS — Z79891 Long term (current) use of opiate analgesic: Secondary | ICD-10-CM | POA: Diagnosis not present

## 2016-02-14 DIAGNOSIS — K219 Gastro-esophageal reflux disease without esophagitis: Secondary | ICD-10-CM | POA: Diagnosis not present

## 2016-02-14 DIAGNOSIS — K220 Achalasia of cardia: Secondary | ICD-10-CM | POA: Diagnosis not present

## 2016-02-14 DIAGNOSIS — K625 Hemorrhage of anus and rectum: Secondary | ICD-10-CM | POA: Diagnosis not present

## 2016-02-14 DIAGNOSIS — K5909 Other constipation: Secondary | ICD-10-CM | POA: Diagnosis not present

## 2016-02-14 DIAGNOSIS — Z8719 Personal history of other diseases of the digestive system: Secondary | ICD-10-CM | POA: Diagnosis not present

## 2016-02-15 ENCOUNTER — Telehealth: Payer: Self-pay

## 2016-02-15 NOTE — Telephone Encounter (Signed)
Spoke with the pharmacy tech at [facility]. Changed completed. thanks

## 2016-02-15 NOTE — Telephone Encounter (Signed)
Notify ok to change - use twice daily (same directions).

## 2016-02-15 NOTE — Telephone Encounter (Signed)
Pharmacy faxed message that Patient cannot afford the suppositories ($163.00 for #14) would you consider a change to lower the cost? Alternative could be Proctosol-HC 2.5% cream is an option and it has a applicator tip? Please advise. thanks

## 2016-02-21 DIAGNOSIS — Z961 Presence of intraocular lens: Secondary | ICD-10-CM | POA: Diagnosis not present

## 2016-02-26 DIAGNOSIS — G253 Myoclonus: Secondary | ICD-10-CM | POA: Diagnosis not present

## 2016-02-26 DIAGNOSIS — G608 Other hereditary and idiopathic neuropathies: Secondary | ICD-10-CM | POA: Diagnosis not present

## 2016-02-29 DIAGNOSIS — J449 Chronic obstructive pulmonary disease, unspecified: Secondary | ICD-10-CM | POA: Diagnosis not present

## 2016-03-04 ENCOUNTER — Encounter: Payer: Self-pay | Admitting: Internal Medicine

## 2016-03-04 NOTE — Telephone Encounter (Signed)
Please confirm pt doing ok and then forward to [place] to schedule a f/u appt in the next 3-4 weeks.  Thanks

## 2016-03-08 ENCOUNTER — Ambulatory Visit: Payer: PPO

## 2016-03-08 ENCOUNTER — Telehealth: Payer: Self-pay | Admitting: Internal Medicine

## 2016-03-08 NOTE — Telephone Encounter (Signed)
Has she tried taking anything for the diarrhea?  Any other symptoms, i.e., abdominal pain, fever, blood, etc?  If any acute symptoms and persistent severe diarrhea, needs to be seen.  Recommend acute care or urgent care today and then we will follow up on their w/up.

## 2016-03-08 NOTE — Telephone Encounter (Signed)
Pt called stating that she is having sever diarrhea  for the past 5 days.. And she called [facility] and they are out till next week.. Please advise

## 2016-03-08 NOTE — Telephone Encounter (Signed)
Patient stated that she is having abdominal pain and weakness. She has been taking anti diarrheal or otherwise imodium. She was told to go to urgent care or acute clinic. Pt stated she sees GI on Monday. Pt was told if it gets worse to see urgent care.

## 2016-03-14 ENCOUNTER — Ambulatory Visit: Payer: PPO

## 2016-03-30 DIAGNOSIS — J449 Chronic obstructive pulmonary disease, unspecified: Secondary | ICD-10-CM | POA: Diagnosis not present

## 2016-04-08 ENCOUNTER — Other Ambulatory Visit: Payer: Self-pay | Admitting: Internal Medicine

## 2016-04-10 ENCOUNTER — Other Ambulatory Visit: Payer: Self-pay | Admitting: Internal Medicine

## 2016-04-11 DIAGNOSIS — S76312A Strain of muscle, fascia and tendon of the posterior muscle group at thigh level, left thigh, initial encounter: Secondary | ICD-10-CM | POA: Diagnosis not present

## 2016-04-11 DIAGNOSIS — Q675 Congenital deformity of spine: Secondary | ICD-10-CM | POA: Diagnosis not present

## 2016-04-11 DIAGNOSIS — M519 Unspecified thoracic, thoracolumbar and lumbosacral intervertebral disc disorder: Secondary | ICD-10-CM | POA: Diagnosis not present

## 2016-04-15 ENCOUNTER — Encounter: Payer: Self-pay | Admitting: Internal Medicine

## 2016-04-16 ENCOUNTER — Encounter: Payer: Self-pay | Admitting: Internal Medicine

## 2016-04-16 DIAGNOSIS — K220 Achalasia of cardia: Secondary | ICD-10-CM | POA: Diagnosis not present

## 2016-04-16 DIAGNOSIS — R1314 Dysphagia, pharyngoesophageal phase: Secondary | ICD-10-CM | POA: Diagnosis not present

## 2016-04-16 DIAGNOSIS — Z79891 Long term (current) use of opiate analgesic: Secondary | ICD-10-CM | POA: Diagnosis not present

## 2016-04-17 DIAGNOSIS — Z96653 Presence of artificial knee joint, bilateral: Secondary | ICD-10-CM | POA: Diagnosis not present

## 2016-04-17 DIAGNOSIS — M961 Postlaminectomy syndrome, not elsewhere classified: Secondary | ICD-10-CM | POA: Diagnosis not present

## 2016-04-17 DIAGNOSIS — M4806 Spinal stenosis, lumbar region: Secondary | ICD-10-CM | POA: Diagnosis not present

## 2016-04-17 DIAGNOSIS — R269 Unspecified abnormalities of gait and mobility: Secondary | ICD-10-CM | POA: Diagnosis not present

## 2016-04-17 NOTE — Telephone Encounter (Signed)
We was not referring this pt for home health. Dr. Nicki Reaper was only giving a name to her to give to the provider that is making the referral.  I will respond to pt with address and phone number

## 2016-04-25 ENCOUNTER — Encounter: Payer: Self-pay | Admitting: Internal Medicine

## 2016-04-29 ENCOUNTER — Encounter: Payer: Self-pay | Admitting: Internal Medicine

## 2016-04-30 DIAGNOSIS — J449 Chronic obstructive pulmonary disease, unspecified: Secondary | ICD-10-CM | POA: Diagnosis not present

## 2016-04-30 NOTE — Telephone Encounter (Signed)
See my chart message.  I have placed the order for the referral - in her husband's chart.  Thanks

## 2016-05-08 ENCOUNTER — Encounter: Payer: Self-pay | Admitting: Internal Medicine

## 2016-05-09 NOTE — Telephone Encounter (Signed)
Records sent electronically

## 2016-05-09 NOTE — Telephone Encounter (Signed)
Ok to forward information in chart.  I see Dr Kathee Delton notes and upper endoscopy.  Do not see chest Ct.

## 2016-05-09 NOTE — Telephone Encounter (Signed)
See previous message

## 2016-05-13 ENCOUNTER — Encounter: Payer: Self-pay | Admitting: Internal Medicine

## 2016-05-13 ENCOUNTER — Ambulatory Visit (INDEPENDENT_AMBULATORY_CARE_PROVIDER_SITE_OTHER): Payer: PPO | Admitting: Internal Medicine

## 2016-05-13 VITALS — BP 130/70 | HR 64 | Temp 98.2°F | Ht 62.0 in | Wt 202.8 lb

## 2016-05-13 DIAGNOSIS — R2681 Unsteadiness on feet: Secondary | ICD-10-CM

## 2016-05-13 DIAGNOSIS — M5442 Lumbago with sciatica, left side: Secondary | ICD-10-CM | POA: Diagnosis not present

## 2016-05-13 DIAGNOSIS — M25562 Pain in left knee: Secondary | ICD-10-CM | POA: Diagnosis not present

## 2016-05-13 DIAGNOSIS — E039 Hypothyroidism, unspecified: Secondary | ICD-10-CM

## 2016-05-13 DIAGNOSIS — I10 Essential (primary) hypertension: Secondary | ICD-10-CM

## 2016-05-13 DIAGNOSIS — G8929 Other chronic pain: Secondary | ICD-10-CM | POA: Diagnosis not present

## 2016-05-13 DIAGNOSIS — E7800 Pure hypercholesterolemia, unspecified: Secondary | ICD-10-CM | POA: Diagnosis not present

## 2016-05-13 DIAGNOSIS — D649 Anemia, unspecified: Secondary | ICD-10-CM | POA: Diagnosis not present

## 2016-05-13 DIAGNOSIS — M7989 Other specified soft tissue disorders: Secondary | ICD-10-CM

## 2016-05-13 DIAGNOSIS — R1310 Dysphagia, unspecified: Secondary | ICD-10-CM

## 2016-05-13 DIAGNOSIS — R253 Fasciculation: Secondary | ICD-10-CM

## 2016-05-13 DIAGNOSIS — Z23 Encounter for immunization: Secondary | ICD-10-CM

## 2016-05-13 DIAGNOSIS — M5440 Lumbago with sciatica, unspecified side: Secondary | ICD-10-CM

## 2016-05-13 DIAGNOSIS — L989 Disorder of the skin and subcutaneous tissue, unspecified: Secondary | ICD-10-CM

## 2016-05-13 DIAGNOSIS — E669 Obesity, unspecified: Secondary | ICD-10-CM

## 2016-05-13 DIAGNOSIS — R258 Other abnormal involuntary movements: Secondary | ICD-10-CM

## 2016-05-13 DIAGNOSIS — I272 Other secondary pulmonary hypertension: Secondary | ICD-10-CM

## 2016-05-13 LAB — CBC WITH DIFFERENTIAL/PLATELET
BASOS PCT: 0.3 % (ref 0.0–3.0)
Basophils Absolute: 0 10*3/uL (ref 0.0–0.1)
EOS ABS: 0.1 10*3/uL (ref 0.0–0.7)
EOS PCT: 2 % (ref 0.0–5.0)
HCT: 32.9 % — ABNORMAL LOW (ref 36.0–46.0)
Hemoglobin: 10.9 g/dL — ABNORMAL LOW (ref 12.0–15.0)
LYMPHS ABS: 1.7 10*3/uL (ref 0.7–4.0)
Lymphocytes Relative: 23 % (ref 12.0–46.0)
MCHC: 33.1 g/dL (ref 30.0–36.0)
MCV: 97.1 fl (ref 78.0–100.0)
MONO ABS: 0.4 10*3/uL (ref 0.1–1.0)
Monocytes Relative: 6.1 % (ref 3.0–12.0)
NEUTROS ABS: 5 10*3/uL (ref 1.4–7.7)
NEUTROS PCT: 68.6 % (ref 43.0–77.0)
PLATELETS: 184 10*3/uL (ref 150.0–400.0)
RBC: 3.39 Mil/uL — ABNORMAL LOW (ref 3.87–5.11)
RDW: 15.1 % (ref 11.5–15.5)
WBC: 7.3 10*3/uL (ref 4.0–10.5)

## 2016-05-13 LAB — BASIC METABOLIC PANEL
BUN: 15 mg/dL (ref 6–23)
CHLORIDE: 99 meq/L (ref 96–112)
CO2: 34 meq/L — AB (ref 19–32)
Calcium: 9.4 mg/dL (ref 8.4–10.5)
Creatinine, Ser: 0.7 mg/dL (ref 0.40–1.20)
GFR: 84.9 mL/min (ref >60.00)
GLUCOSE: 94 mg/dL (ref 70–99)
POTASSIUM: 3.7 meq/L (ref 3.5–5.1)
SODIUM: 137 meq/L (ref 135–145)

## 2016-05-13 LAB — HEPATIC FUNCTION PANEL
ALT: 11 U/L (ref 0–35)
AST: 15 U/L (ref 0–37)
Albumin: 4 g/dL (ref 3.5–5.2)
Alkaline Phosphatase: 78 U/L (ref 39–117)
BILIRUBIN DIRECT: 0.1 mg/dL (ref 0.0–0.3)
BILIRUBIN TOTAL: 0.3 mg/dL (ref 0.2–1.2)
Total Protein: 7 g/dL (ref 6.0–8.3)

## 2016-05-13 LAB — FERRITIN: Ferritin: 97.5 ng/mL (ref 10.0–291.0)

## 2016-05-13 MED ORDER — MUPIROCIN 2 % EX OINT: TOPICAL_OINTMENT | CUTANEOUS | 0 refills | Status: DC

## 2016-05-13 NOTE — Progress Notes (Signed)
Pre visit review using our clinic review tool, if applicable. No additional management support is needed unless otherwise documented below in the visit note.

## 2016-05-13 NOTE — Progress Notes (Signed)
Patient ID: Kimberly Roy, female   DOB: 08-03-1931, 82 y.o.   MRN: [id number]   Subjective:    Patient ID: Kimberly Roy, female    DOB: 1931-05-11, 82 y.o.   MRN: [id number]  HPI  Patient here for a scheduled follow up.  She reports skin change - lower back.  Thought at first was dry skin.  Now raised.  No significant pain.  No chest pain.  Breathing stable.  Still with some jerking of her arms and hands.  She saw neurology.  W/up unrevealing.  Discussed f/u with neurology.  She declines.  Wants to monitor.  She is having problems with being able to stand up straight.  Hard to straighten out her left leg - secondary to her knee.  This is making her more unstable.  She is due to see ortho today about this.  She also reports legs and feet have been swelling more.  Some redness, but appears to be more related to stasis changes.  Blood pressure averaging 120-150/60-70s.     Past Medical History:  Diagnosis Date  . Anemia   . Anxiety   . Chest pain   . CHF (congestive heart failure) ([place])   . Constipation   . DDD (degenerative disc disease), cervical   . Depression   . DVT (deep venous thrombosis) ([place])   . Dysphonia   . Dyspnea   . Fatty liver   . Fatty liver   . Headache   . Hyperlipidemia   . Hyperpiesia   . Hypertension   . Hypothyroidism   . Interstitial cystitis   . Left ventricular dysfunction   . Lymphedema   . Nephrolithiasis   . Obstructive sleep apnea   . Osteoarthritis    knees/cervical and lumbar spine  . Pulmonary hypertension ([place])   . Pulmonary nodules    followed by Dr Raul Del  . Pure hypercholesterolemia   . Renal cyst    right   Past Surgical History:  Procedure Laterality Date  . ABDOMINAL HYSTERECTOMY     ovaries left in place  . APPENDECTOMY    . Back Surgeries    . BREAST REDUCTION SURGERY     3/99  . CARDIAC CATHETERIZATION    . cataracts Bilateral   . CERVICAL SPINE SURGERY    . ESOPHAGEAL MANOMETRY N/A 08/02/2015   Procedure: ESOPHAGEAL MANOMETRY  (EM);  Surgeon: Josefine Class, MD;  [facility] END[facility]COPY;  Service: Endoscopy;  Laterality: N/A;  . ESOPHAGOGASTRODUODENOSCOPY N/A 02/27/2015   Procedure: ESOPHAGOGASTRODUODENOSCOPY (EGD);  Surgeon: Hulen Luster, MD;  Location[facility] ENDOSCOPY;  Service: Gastroenterology;  Laterality: N/A;  . EXCISIONAL HEMORRHOIDECTOMY    . HEMORRHOID SURGERY    . HIP SURGERY  2013   Right hip surgery  . KNEE ARTHROSCOPY     left and right  . REDUCTION MAMMAPLASTY Bilateral   . REPLACEMENT TOTAL KNEE Bilateral   . rotator cuff surgery     blilateral  . TONSILECTOMY/ADENOIDECTOMY WITH MYRINGOTOMY     Family History  Problem Relation Age of Onset  . Heart disease Mother   . Stroke Mother   . Hypertension Mother   . Heart disease Father     myocardial infarction age 24   Social History   Social History  . Marital status: Married    Spouse name: N/A  . Number of children: 2  . Years of education: N/A   Social History Main Topics  . Smoking status: Never Smoker  . Smokeless tobacco:  Never Used  . Alcohol use No  . Drug use: No  . Sexual activity: Not Currently   Other Topics Concern  . None   Social History Narrative  . None    Outpatient Encounter Prescriptions as of 05/13/2016  Medication Sig  . aluminum-magnesium hydroxide-simethicone (MAALOX) 732-202-54 MG/5ML SUSP Take by mouth.  Marland Kitchen amLODipine (NORVASC) 10 MG tablet TAKE ONE (1) TABLET EACH DAY  . atorvastatin (LIPITOR) 20 MG tablet TAKE ONE TABLET AT BEDTIME  . benazepril (LOTENSIN) 40 MG tablet TAKE ONE (1) TABLET EACH DAY  . Calcium-Magnesium-Vitamin D 600-40-500 MG-MG-UNIT TB24 Take 630 mg by mouth 4 (four) times daily.  [name] Sodium 30-100 MG CAPS Take by mouth.  . cephALEXin (KEFLEX) 250 MG capsule Take 250 mg by mouth daily.  . Cholecalciferol (VITAMIN D3) 1000 UNITS CAPS Take by mouth.  . DULoxetine (CYMBALTA) 60 MG capsule Take 60 mg by mouth daily.  . fluticasone (FLONASE) 50 MCG/ACT nasal spray  Place 2 sprays into the nose daily.  . furosemide (LASIX) 20 MG tablet TAKE ONE (1) TABLET EACH DAY AS NEEDED  . gabapentin (NEURONTIN) 300 MG capsule Take 300 mg by mouth 3 (three) times daily.  . hydrochlorothiazide (HYDRODIURIL) 25 MG tablet TAKE ONE (1) TABLET EACH DAY  . HYDROcodone-acetaminophen (NORCO) 10-325 MG per tablet Take 1 tablet by mouth every 4 (four) hours as needed.  . Inulin (FIBER CHOICE PO) Take 2 tablets by mouth daily. Chew tablets  . magnesium oxide (MAG-OX) 400 MG tablet Take 1 tablet (400 mg total) by mouth daily.  . metoprolol succinate (TOPROL-XL) 100 MG 24 hr tablet TAKE ONE TABLET TWICE DAILY. TAKE WITH OR IMMEDIATELY FOLLOWING A MEAL  . morphine (MS CONTIN) 30 MG 12 hr tablet Take 30 mg by mouth daily. Takes around midday  . morphine (MS CONTIN) 60 MG 12 hr tablet Take 60 mg by mouth 2 (two) times daily.   Marland Kitchen nystatin cream (MYCOSTATIN) Apply topically 2 (two) times daily.  . ondansetron (ZOFRAN-ODT) 4 MG disintegrating tablet Take 4 mg by mouth every 8 (eight) hours as needed for nausea or vomiting.  . pantoprazole (PROTONIX) 40 MG tablet TAKE ONE TABLET TWICE DAILY  . polyethylene glycol powder (GLYCOLAX/MIRALAX) powder MIX 17 GRAMS (AS MARKED IN BOTTLE TOP) IN 8 OUNCES OF WATER, MIX AND DRINK ONCEA DAY AS DIRECTED.  . Probiotic Product (ALIGN PO) Take by mouth daily.  . ranitidine (ZANTAC) 150 MG tablet Take 150 mg by mouth 2 (two) times daily.  . Simethicone (GAS-X PO) Take by mouth.  . sodium chloride (OCEAN) 0.65 % nasal spray Place 1 spray into the nose as needed.  Marland Kitchen SYNTHROID 112 MCG tablet TAKE ONE (1) TABLET EACH DAY  . mupirocin ointment (BACTROBAN) 2 % Apply to affected area on back bid   No facility-administered encounter medications on file as of 05/13/2016.     Review of Systems  Constitutional: Negative for appetite change and unexpected weight change.  HENT: Negative for congestion and sinus pressure.   Respiratory: Negative for cough, chest  tightness and shortness of breath.   Cardiovascular: Positive for leg swelling. Negative for chest pain and palpitations.  Gastrointestinal: Negative for abdominal pain, diarrhea, nausea and vomiting.  Musculoskeletal: Positive for back pain.       Left knee problems as outlined.    Skin: Negative for color change and rash.  Neurological: Negative for dizziness, light-headedness and headaches.  Psychiatric/Behavioral: Negative for agitation and dysphoric mood.  Objective:    Physical Exam  Constitutional: She appears well-developed and well-nourished. No distress.  HENT:  Nose: Nose normal.  Mouth/Throat: Oropharynx is clear and moist.  Neck: Neck supple. No thyromegaly present.  Cardiovascular: Normal rate and regular rhythm.   Pulmonary/Chest: Breath sounds normal. No respiratory distress. She has no wheezes.  Abdominal: Soft. Bowel sounds are normal. There is no tenderness.  Musculoskeletal: She exhibits no tenderness.  Pedal and lower extremity edema.   Lymphadenopathy:    She has no cervical adenopathy.  Skin: No erythema.  Lower extremity redness - appears to be c/w stasis changes.    Psychiatric: She has a normal mood and affect. Her behavior is normal.    BP 130/70   Pulse 64   Temp 98.2 F (36.8 C) (Oral)   Ht _0  (1.575 m)   Wt 202 lb 12.8 oz (92 kg)   SpO2 91%   BMI 37.09 kg/m  Wt Readings from Last 3 Encounters:  05/13/16 202 lb 12.8 oz (92 kg)  02/12/16 213 lb (96.6 kg)  01/20/16 206 lb (93.4 kg)     Lab Results  Component Value Date   WBC 7.3 05/13/2016   HGB 10.9 (L) 05/13/2016   HCT 32.9 (L) 05/13/2016   PLT 184.0 05/13/2016   GLUCOSE 94 05/13/2016   CHOL 132 10/03/2015   TRIG 72.0 10/03/2015   HDL 45.50 10/03/2015   LDLCALC 72 10/03/2015   ALT 11 05/13/2016   AST 15 05/13/2016   NA 137 05/13/2016   K 3.7 05/13/2016   CL 99 05/13/2016   CREATININE 0.70 05/13/2016   BUN 15 05/13/2016   CO2 34 (H) 05/13/2016   TSH 1.216 02/12/2016    INR 1.0 12/08/2013   HGBA1C 6.0 10/19/2014       Assessment & Plan:   Problem List Items Addressed This Visit    Anemia    Persistent decreased hgb.  Referred to GI.  W/up in progress.  Evaluating swallowing.  Follow.        Relevant Orders   CBC with Differential/Platelet (Completed)   Ferritin (Completed)   Back pain    Chronic.        Dysphagia    Seeing Dr Koren Bound at [facility].  Achalasia.  Planning further w/up after review of records and testing.  Continue f/u.        Hypercholesterolemia - Primary    Low cholesterol diet and exercise.  On lipitor.  Follow lipid panel and liver function tests.        Relevant Orders   Hepatic function panel (Completed)   Hypertension    Blood pressure as outlined.  Same medication regimen.  Follow pressures.  Follow metabolic panel.       Relevant Orders   Basic metabolic panel (Completed)   Hypothyroidism    On thyroid replacement.  Follow tsh.       Leg swelling    Leg elevation.  Compression hose.  Avoid increased salt intake.  Follow.       Muscle twitching    Persistent.  Saw neurology.  Desires no further w/up at this time.       Obesity (BMI 30-39.9)    Diet and exercise as tolerated.  Follow.       Pulmonary hypertension (HCC)    Continue cpap.  Has seen pulmonary.        Unsteady gait    Her knee is bothering her more.  Posterior.  Unable to straighten.  Affecting  her gait.  Seeing ortho today.         Other Visit Diagnoses    Encounter for immunization       Relevant Orders   Flu vaccine HIGH DOSE PF (Completed)   Back skin lesion       bactroban as directed.  follow.        Einar Pheasant, MD

## 2016-05-14 ENCOUNTER — Encounter: Payer: Self-pay | Admitting: Internal Medicine

## 2016-05-14 ENCOUNTER — Other Ambulatory Visit: Payer: Self-pay | Admitting: Physician Assistant

## 2016-05-14 DIAGNOSIS — M25562 Pain in left knee: Secondary | ICD-10-CM

## 2016-05-19 ENCOUNTER — Encounter: Payer: Self-pay | Admitting: Internal Medicine

## 2016-05-19 NOTE — Assessment & Plan Note (Signed)
Chronic. 

## 2016-05-19 NOTE — Assessment & Plan Note (Signed)
On thyroid replacement.  Follow tsh.

## 2016-05-19 NOTE — Assessment & Plan Note (Signed)
Persistent decreased hgb.  Referred to GI.  W/up in progress.  Evaluating swallowing.  Follow.

## 2016-05-19 NOTE — Assessment & Plan Note (Signed)
Diet and exercise as tolerated.  Follow.

## 2016-05-19 NOTE — Assessment & Plan Note (Signed)
Seeing Dr Koren Bound at [facility].  Achalasia.  Planning further w/up after review of records and testing.  Continue f/u.

## 2016-05-19 NOTE — Assessment & Plan Note (Signed)
Leg elevation.  Compression hose.  Avoid increased salt intake.  Follow.

## 2016-05-19 NOTE — Assessment & Plan Note (Signed)
Her knee is bothering her more.  Posterior.  Unable to straighten.  Affecting her gait.  Seeing ortho today.

## 2016-05-19 NOTE — Assessment & Plan Note (Signed)
Persistent.  Saw neurology.  Desires no further w/up at this time.

## 2016-05-19 NOTE — Assessment & Plan Note (Signed)
Continue cpap.  Has seen pulmonary.

## 2016-05-19 NOTE — Assessment & Plan Note (Signed)
Low cholesterol diet and exercise.  On lipitor.  Follow lipid panel and liver function tests.   

## 2016-05-19 NOTE — Assessment & Plan Note (Signed)
Blood pressure as outlined.  Same medication regimen.  Follow pressures.  Follow metabolic panel.

## 2016-05-20 ENCOUNTER — Other Ambulatory Visit: Payer: Self-pay | Admitting: Internal Medicine

## 2016-05-21 ENCOUNTER — Encounter: Payer: Self-pay | Admitting: Internal Medicine

## 2016-05-21 ENCOUNTER — Other Ambulatory Visit: Payer: Self-pay | Admitting: Internal Medicine

## 2016-05-21 DIAGNOSIS — D649 Anemia, unspecified: Secondary | ICD-10-CM

## 2016-05-22 ENCOUNTER — Ambulatory Visit
Admission: RE | Admit: 2016-05-22 | Discharge: 2016-05-22 | Disposition: A | Discharge status: Home / Self Care | Payer: PPO | Source: Ambulatory Visit | Attending: Physician Assistant | Admitting: Physician Assistant

## 2016-05-22 DIAGNOSIS — M25562 Pain in left knee: Secondary | ICD-10-CM | POA: Diagnosis not present

## 2016-05-24 ENCOUNTER — Other Ambulatory Visit: Payer: Self-pay | Admitting: *Deleted

## 2016-05-24 NOTE — Patient Outreach (Addendum)
[name]) Care Management  05/24/2016  [name] 10/09/1930 [id number]  HTA referral:  Telephone call to patient; left HIPPA compliant voice mail requesting return call.  Plan: Will follow up within 3 days.  Sherrin Daisy, RN BSN [place] Management Coordinator [facility] Care Management  [phone]

## 2016-05-26 ENCOUNTER — Encounter: Payer: Self-pay | Admitting: Internal Medicine

## 2016-05-26 DIAGNOSIS — L989 Disorder of the skin and subcutaneous tissue, unspecified: Secondary | ICD-10-CM

## 2016-05-27 ENCOUNTER — Other Ambulatory Visit: Payer: Self-pay | Admitting: *Deleted

## 2016-05-27 NOTE — Telephone Encounter (Signed)
See pts my chart message.  Order placed for dermatology referral.  Pt notified via my chart.  Thanks

## 2016-05-27 NOTE — Patient Outreach (Signed)
[facility]) Care Management  05/27/2016  MARGEAUX SWANTEK 24-Feb-1931 [id number]  Telephone call to patient; left HIPPA compliant voice mail requesting return call.  Plan: Will follow up.  Sherrin Daisy, RN BSN [place] Management Coordinator [facility] Care Management  [phone]

## 2016-05-28 ENCOUNTER — Encounter: Payer: Self-pay | Admitting: Internal Medicine

## 2016-05-29 ENCOUNTER — Other Ambulatory Visit: Payer: Self-pay | Admitting: *Deleted

## 2016-05-29 ENCOUNTER — Encounter: Payer: Self-pay | Admitting: *Deleted

## 2016-05-29 NOTE — Patient Outreach (Signed)
[facility]) Care Management  05/29/2016  Kimberly Roy 29-Dec-1930 [id number]  Telephone call to patient who was advised of reason for call and of [facility] care management services.  HIPPA  verification received from patient.   Patient had questions regarding [facility] [place] CM answered questions. Patient voices that she currently does not have health concerns but would like literature & contact information for [facility] care management services if she needs in the future.  Plan; [facility] brochure with contact information. Close case.  Sherrin Daisy, RN BSN [place] Management Coordinator [facility] Care Management  [phone]

## 2016-05-31 DIAGNOSIS — J449 Chronic obstructive pulmonary disease, unspecified: Secondary | ICD-10-CM | POA: Diagnosis not present

## 2016-06-05 DIAGNOSIS — G4733 Obstructive sleep apnea (adult) (pediatric): Secondary | ICD-10-CM | POA: Diagnosis not present

## 2016-06-05 DIAGNOSIS — Z01818 Encounter for other preprocedural examination: Secondary | ICD-10-CM | POA: Diagnosis not present

## 2016-06-05 DIAGNOSIS — E039 Hypothyroidism, unspecified: Secondary | ICD-10-CM | POA: Diagnosis not present

## 2016-06-05 DIAGNOSIS — D649 Anemia, unspecified: Secondary | ICD-10-CM | POA: Diagnosis not present

## 2016-06-05 DIAGNOSIS — E7800 Pure hypercholesterolemia, unspecified: Secondary | ICD-10-CM | POA: Diagnosis not present

## 2016-06-05 DIAGNOSIS — Z79899 Other long term (current) drug therapy: Secondary | ICD-10-CM | POA: Diagnosis not present

## 2016-06-05 DIAGNOSIS — I10 Essential (primary) hypertension: Secondary | ICD-10-CM | POA: Diagnosis not present

## 2016-06-05 DIAGNOSIS — Z9889 Other specified postprocedural states: Secondary | ICD-10-CM | POA: Diagnosis not present

## 2016-06-05 DIAGNOSIS — I5032 Chronic diastolic (congestive) heart failure: Secondary | ICD-10-CM | POA: Diagnosis not present

## 2016-06-05 DIAGNOSIS — Z22322 Carrier or suspected carrier of Methicillin resistant Staphylococcus aureus: Secondary | ICD-10-CM | POA: Diagnosis not present

## 2016-06-05 DIAGNOSIS — K220 Achalasia of cardia: Secondary | ICD-10-CM | POA: Diagnosis not present

## 2016-06-05 DIAGNOSIS — K219 Gastro-esophageal reflux disease without esophagitis: Secondary | ICD-10-CM | POA: Diagnosis not present

## 2016-06-05 DIAGNOSIS — I272 Pulmonary hypertension, unspecified: Secondary | ICD-10-CM | POA: Diagnosis not present

## 2016-06-06 ENCOUNTER — Other Ambulatory Visit: Payer: PPO

## 2016-06-10 ENCOUNTER — Other Ambulatory Visit (INDEPENDENT_AMBULATORY_CARE_PROVIDER_SITE_OTHER): Payer: PPO

## 2016-06-10 DIAGNOSIS — F5105 Insomnia due to other mental disorder: Secondary | ICD-10-CM | POA: Diagnosis not present

## 2016-06-10 DIAGNOSIS — F330 Major depressive disorder, recurrent, mild: Secondary | ICD-10-CM | POA: Diagnosis not present

## 2016-06-10 DIAGNOSIS — D649 Anemia, unspecified: Secondary | ICD-10-CM

## 2016-06-10 DIAGNOSIS — F419 Anxiety disorder, unspecified: Secondary | ICD-10-CM | POA: Diagnosis not present

## 2016-06-10 LAB — CBC WITH DIFFERENTIAL/PLATELET
Basophils Absolute: 0 10*3/uL (ref 0.0–0.1)
Basophils Relative: 0.3 % (ref 0.0–3.0)
EOS ABS: 0.2 10*3/uL (ref 0.0–0.7)
Eosinophils Relative: 2.5 % (ref 0.0–5.0)
HEMATOCRIT: 32.7 % — AB (ref 36.0–46.0)
HEMOGLOBIN: 10.7 g/dL — AB (ref 12.0–15.0)
LYMPHS PCT: 34.3 % (ref 12.0–46.0)
Lymphs Abs: 2.3 10*3/uL (ref 0.7–4.0)
MCHC: 32.8 g/dL (ref 30.0–36.0)
MCV: 96.4 fl (ref 78.0–100.0)
MONO ABS: 0.6 10*3/uL (ref 0.1–1.0)
Monocytes Relative: 8.9 % (ref 3.0–12.0)
Neutro Abs: 3.7 10*3/uL (ref 1.4–7.7)
Neutrophils Relative %: 54 % (ref 43.0–77.0)
PLATELETS: 195 10*3/uL (ref 150.0–400.0)
RBC: 3.39 Mil/uL — ABNORMAL LOW (ref 3.87–5.11)
RDW: 15 % (ref 11.5–15.5)
WBC: 6.8 10*3/uL (ref 4.0–10.5)

## 2016-06-10 LAB — FERRITIN: Ferritin: 69.3 ng/mL (ref 10.0–291.0)

## 2016-06-10 LAB — IBC PANEL
Iron: 80 ug/dL (ref 42–145)
Saturation Ratios: 26.1 % (ref 20.0–50.0)
Transferrin: 219 mg/dL (ref 212.0–360.0)

## 2016-06-10 LAB — VITAMIN B12: VITAMIN B 12: 1167 pg/mL — AB (ref 211–911)

## 2016-06-11 ENCOUNTER — Other Ambulatory Visit: Payer: Self-pay | Admitting: Internal Medicine

## 2016-06-11 ENCOUNTER — Encounter: Payer: Self-pay | Admitting: Internal Medicine

## 2016-06-11 DIAGNOSIS — D649 Anemia, unspecified: Secondary | ICD-10-CM

## 2016-06-11 DIAGNOSIS — M7022 Olecranon bursitis, left elbow: Secondary | ICD-10-CM | POA: Diagnosis not present

## 2016-06-11 NOTE — Progress Notes (Signed)
Order placed for hematology referral.

## 2016-06-12 DIAGNOSIS — L298 Other pruritus: Secondary | ICD-10-CM | POA: Diagnosis not present

## 2016-06-12 DIAGNOSIS — R208 Other disturbances of skin sensation: Secondary | ICD-10-CM | POA: Diagnosis not present

## 2016-06-12 DIAGNOSIS — M533 Sacrococcygeal disorders, not elsewhere classified: Secondary | ICD-10-CM | POA: Diagnosis not present

## 2016-06-12 DIAGNOSIS — L820 Inflamed seborrheic keratosis: Secondary | ICD-10-CM | POA: Diagnosis not present

## 2016-06-13 DIAGNOSIS — K219 Gastro-esophageal reflux disease without esophagitis: Secondary | ICD-10-CM | POA: Diagnosis not present

## 2016-06-13 DIAGNOSIS — G4733 Obstructive sleep apnea (adult) (pediatric): Secondary | ICD-10-CM | POA: Diagnosis not present

## 2016-06-13 DIAGNOSIS — E7800 Pure hypercholesterolemia, unspecified: Secondary | ICD-10-CM | POA: Diagnosis not present

## 2016-06-13 DIAGNOSIS — E039 Hypothyroidism, unspecified: Secondary | ICD-10-CM | POA: Diagnosis not present

## 2016-06-13 DIAGNOSIS — I272 Pulmonary hypertension, unspecified: Secondary | ICD-10-CM | POA: Diagnosis not present

## 2016-06-13 DIAGNOSIS — I110 Hypertensive heart disease with heart failure: Secondary | ICD-10-CM | POA: Diagnosis not present

## 2016-06-13 DIAGNOSIS — I5032 Chronic diastolic (congestive) heart failure: Secondary | ICD-10-CM | POA: Diagnosis not present

## 2016-06-13 DIAGNOSIS — Z9889 Other specified postprocedural states: Secondary | ICD-10-CM | POA: Diagnosis not present

## 2016-06-13 DIAGNOSIS — K220 Achalasia of cardia: Secondary | ICD-10-CM | POA: Diagnosis not present

## 2016-06-13 DIAGNOSIS — I10 Essential (primary) hypertension: Secondary | ICD-10-CM | POA: Diagnosis not present

## 2016-06-13 DIAGNOSIS — D649 Anemia, unspecified: Secondary | ICD-10-CM | POA: Diagnosis not present

## 2016-06-21 DIAGNOSIS — I509 Heart failure, unspecified: Secondary | ICD-10-CM | POA: Diagnosis not present

## 2016-06-21 DIAGNOSIS — Z96653 Presence of artificial knee joint, bilateral: Secondary | ICD-10-CM | POA: Diagnosis not present

## 2016-06-21 DIAGNOSIS — K449 Diaphragmatic hernia without obstruction or gangrene: Secondary | ICD-10-CM | POA: Diagnosis not present

## 2016-06-21 DIAGNOSIS — Z86718 Personal history of other venous thrombosis and embolism: Secondary | ICD-10-CM | POA: Diagnosis not present

## 2016-06-21 DIAGNOSIS — G4730 Sleep apnea, unspecified: Secondary | ICD-10-CM | POA: Diagnosis not present

## 2016-06-21 DIAGNOSIS — R1310 Dysphagia, unspecified: Secondary | ICD-10-CM | POA: Diagnosis not present

## 2016-06-21 DIAGNOSIS — I110 Hypertensive heart disease with heart failure: Secondary | ICD-10-CM | POA: Diagnosis not present

## 2016-06-21 DIAGNOSIS — K220 Achalasia of cardia: Secondary | ICD-10-CM | POA: Diagnosis not present

## 2016-06-21 DIAGNOSIS — Z79891 Long term (current) use of opiate analgesic: Secondary | ICD-10-CM | POA: Diagnosis not present

## 2016-06-21 DIAGNOSIS — I272 Pulmonary hypertension, unspecified: Secondary | ICD-10-CM | POA: Diagnosis not present

## 2016-06-21 DIAGNOSIS — E785 Hyperlipidemia, unspecified: Secondary | ICD-10-CM | POA: Diagnosis not present

## 2016-06-21 DIAGNOSIS — Z79899 Other long term (current) drug therapy: Secondary | ICD-10-CM | POA: Diagnosis not present

## 2016-06-21 DIAGNOSIS — E039 Hypothyroidism, unspecified: Secondary | ICD-10-CM | POA: Diagnosis not present

## 2016-06-21 DIAGNOSIS — F419 Anxiety disorder, unspecified: Secondary | ICD-10-CM | POA: Diagnosis not present

## 2016-06-21 DIAGNOSIS — K219 Gastro-esophageal reflux disease without esophagitis: Secondary | ICD-10-CM | POA: Diagnosis not present

## 2016-06-30 DIAGNOSIS — J449 Chronic obstructive pulmonary disease, unspecified: Secondary | ICD-10-CM | POA: Diagnosis not present

## 2016-07-04 ENCOUNTER — Inpatient Hospital Stay: Payer: PPO | Admitting: Internal Medicine

## 2016-07-06 ENCOUNTER — Encounter: Payer: Self-pay | Admitting: Internal Medicine

## 2016-07-08 MED ORDER — NYSTATIN 100000 UNIT/GM EX CREA: TOPICAL_CREAM | Freq: Two times a day (BID) | CUTANEOUS | 1 refills | Status: DC

## 2016-07-08 NOTE — Telephone Encounter (Signed)
rx sent in for nystatin cream.  See my chart message.

## 2016-07-11 ENCOUNTER — Ambulatory Visit: Payer: PPO | Admitting: Internal Medicine

## 2016-07-12 ENCOUNTER — Ambulatory Visit: Payer: PPO | Admitting: Internal Medicine

## 2016-07-22 ENCOUNTER — Encounter: Payer: Self-pay | Admitting: Internal Medicine

## 2016-07-22 ENCOUNTER — Ambulatory Visit (INDEPENDENT_AMBULATORY_CARE_PROVIDER_SITE_OTHER): Payer: PPO | Admitting: Internal Medicine

## 2016-07-22 DIAGNOSIS — G8929 Other chronic pain: Secondary | ICD-10-CM

## 2016-07-22 DIAGNOSIS — M7989 Other specified soft tissue disorders: Secondary | ICD-10-CM

## 2016-07-22 DIAGNOSIS — I272 Pulmonary hypertension, unspecified: Secondary | ICD-10-CM

## 2016-07-22 DIAGNOSIS — F439 Reaction to severe stress, unspecified: Secondary | ICD-10-CM | POA: Diagnosis not present

## 2016-07-22 DIAGNOSIS — D649 Anemia, unspecified: Secondary | ICD-10-CM

## 2016-07-22 DIAGNOSIS — E7800 Pure hypercholesterolemia, unspecified: Secondary | ICD-10-CM

## 2016-07-22 DIAGNOSIS — G4733 Obstructive sleep apnea (adult) (pediatric): Secondary | ICD-10-CM | POA: Diagnosis not present

## 2016-07-22 DIAGNOSIS — E669 Obesity, unspecified: Secondary | ICD-10-CM | POA: Diagnosis not present

## 2016-07-22 DIAGNOSIS — E039 Hypothyroidism, unspecified: Secondary | ICD-10-CM

## 2016-07-22 DIAGNOSIS — K5909 Other constipation: Secondary | ICD-10-CM

## 2016-07-22 DIAGNOSIS — M5440 Lumbago with sciatica, unspecified side: Secondary | ICD-10-CM

## 2016-07-22 DIAGNOSIS — I10 Essential (primary) hypertension: Secondary | ICD-10-CM

## 2016-07-22 DIAGNOSIS — I509 Heart failure, unspecified: Secondary | ICD-10-CM

## 2016-07-22 NOTE — Progress Notes (Signed)
Pre visit review using our clinic review tool, if applicable. No additional management support is needed unless otherwise documented below in the visit note.

## 2016-07-22 NOTE — Progress Notes (Signed)
Patient ID: Kimberly Roy, female   DOB: 1931-01-12, 82 y.o.   MRN: [id number]   Subjective:    Patient ID: Kimberly Roy, female    DOB: August 15, 1931, 82 y.o.   MRN: [id number]  HPI  Patient here for a scheduled follow up.  She is accompanied by her husband.  History obtained from both of them.  She has multiple concerns.  She is concerned about her elevated blood pressure.  Has varied from 136 - 172/60-70s.  Appears to have more readings recently in the 140-150s range.  She is only taking hctz prn and rarely takes.  She also reports problems with constipation.  Taking miralax and four stool softeners.  She is trying to stray hydrated.  Given on this regimen and still with issues, discussed referral to GI.  She is in agreement.  She has seen neurology previously for jerking movements of her extremities.  Had NCS.  C/w polyneuropathy.  She is also concerned regarding a persistent skin lesion on her buttock and one on her back.  Has seen dermatology.  Plans to f/u with dermatology.     Past Medical History:  Diagnosis Date  . Anemia   . Anxiety   . Chest pain   . CHF (congestive heart failure) ([place])   . Constipation   . DDD (degenerative disc disease), cervical   . Depression   . DVT (deep venous thrombosis) ([place])   . Dysphonia   . Dyspnea   . Fatty liver   . Fatty liver   . Headache   . Hyperlipidemia   . Hyperpiesia   . Hypertension   . Hypothyroidism   . Interstitial cystitis   . Left ventricular dysfunction   . Lymphedema   . Nephrolithiasis   . Obstructive sleep apnea   . Osteoarthritis    knees/cervical and lumbar spine  . Pulmonary hypertension   . Pulmonary nodules    followed by Dr Raul Del  . Pure hypercholesterolemia   . Renal cyst    right   Past Surgical History:  Procedure Laterality Date  . ABDOMINAL HYSTERECTOMY     ovaries left in place  . APPENDECTOMY    . Back Surgeries    . BREAST REDUCTION SURGERY     3/99  . CARDIAC CATHETERIZATION    . cataracts  Bilateral   . CERVICAL SPINE SURGERY    . ESOPHAGEAL MANOMETRY N/A 08/02/2015   Procedure: ESOPHAGEAL MANOMETRY (EM);  Surgeon: Josefine Class, MD;  Location: [facility] ENDOSCOPY;  Service: Endoscopy;  Laterality: N/A;  . ESOPHAGOGASTRODUODENOSCOPY N/A 02/27/2015   Procedure: ESOPHAGOGASTRODUODENOSCOPY (EGD);  Surgeon: Hulen Luster, MD;  Location: [facility] ENDOSCOPY;  Service: Gastroenterology;  Laterality: N/A;  . EXCISIONAL HEMORRHOIDECTOMY    . HEMORRHOID SURGERY    . HIP SURGERY  2013   Right hip surgery  . KNEE ARTHROSCOPY     left and right  . REDUCTION MAMMAPLASTY Bilateral   . REPLACEMENT TOTAL KNEE Bilateral   . rotator cuff surgery     blilateral  . TONSILECTOMY/ADENOIDECTOMY WITH MYRINGOTOMY     Family History  Problem Relation Age of Onset  . Heart disease Mother   . Stroke Mother   . Hypertension Mother   . Heart disease Father     myocardial infarction age 71   Social History   Social History  . Marital status: Married    Spouse name: N/A  . Number of children: 2  . Years of education: N/A   Social History  Main Topics  . Smoking status: Never Smoker  . Smokeless tobacco: Never Used  . Alcohol use No  . Drug use: No  . Sexual activity: Not Currently   Other Topics Concern  . None   Social History Narrative  . None    Outpatient Encounter Prescriptions as of 07/22/2016  Medication Sig  . aluminum-magnesium hydroxide-simethicone (MAALOX) [phone] MG/5ML SUSP Take by mouth.  Marland Kitchen amLODipine (NORVASC) 10 MG tablet TAKE ONE (1) TABLET EACH DAY  . atorvastatin (LIPITOR) 20 MG tablet TAKE ONE TABLET AT BEDTIME  . benazepril (LOTENSIN) 40 MG tablet TAKE ONE (1) TABLET EACH DAY  . Calcium-Magnesium-Vitamin D 600-40-500 MG-MG-UNIT TB24 Take 630 mg by mouth 4 (four) times daily.  [name] Sodium 30-100 MG CAPS Take by mouth.  . cephALEXin (KEFLEX) 250 MG capsule Take 250 mg by mouth daily.  . Cholecalciferol (VITAMIN D3) 1000 UNITS CAPS Take by mouth.    . DULoxetine (CYMBALTA) 60 MG capsule Take 60 mg by mouth daily.  . fluticasone (FLONASE) 50 MCG/ACT nasal spray Place 2 sprays into the nose daily.  . furosemide (LASIX) 20 MG tablet TAKE ONE (1) TABLET EACH DAY AS NEEDED  . gabapentin (NEURONTIN) 300 MG capsule Take 300 mg by mouth 3 (three) times daily.  . hydrochlorothiazide (HYDRODIURIL) 25 MG tablet TAKE ONE (1) TABLET EACH DAY  . HYDROcodone-acetaminophen (NORCO) 10-325 MG per tablet Take 1 tablet by mouth every 4 (four) hours as needed.  . Inulin (FIBER CHOICE PO) Take 2 tablets by mouth daily. Chew tablets  . magnesium oxide (MAG-OX) 400 MG tablet Take 1 tablet (400 mg total) by mouth daily.  . metoprolol succinate (TOPROL-XL) 100 MG 24 hr tablet TAKE ONE TABLET TWICE DAILY. TAKE WITH OR IMMEDIATELY FOLLOWING A MEAL  . morphine (MS CONTIN) 30 MG 12 hr tablet Take 30 mg by mouth daily. Takes around midday  . morphine (MS CONTIN) 60 MG 12 hr tablet Take 60 mg by mouth 2 (two) times daily.   . mupirocin ointment (BACTROBAN) 2 % Apply to affected area on back bid  . nystatin cream (MYCOSTATIN) Apply topically 2 (two) times daily.  . pantoprazole (PROTONIX) 40 MG tablet TAKE ONE TABLET TWICE DAILY  . polyethylene glycol powder (GLYCOLAX/MIRALAX) powder MIX 17 GRAMS (AS MARKED IN BOTTLE TOP) IN 8 OUNCES OF WATER, MIX AND DRINK ONCEA DAY AS DIRECTED.  . Probiotic Product (ALIGN PO) Take by mouth daily.  . ranitidine (ZANTAC) 150 MG tablet Take 150 mg by mouth 2 (two) times daily.  . Simethicone (GAS-X PO) Take by mouth.  . sodium chloride (OCEAN) 0.65 % nasal spray Place 1 spray into the nose as needed.  Marland Kitchen SYNTHROID 112 MCG tablet TAKE ONE (1) TABLET EACH DAY  . [DISCONTINUED] ondansetron (ZOFRAN-ODT) 4 MG disintegrating tablet Take 4 mg by mouth every 8 (eight) hours as needed for nausea or vomiting.   No facility-administered encounter medications on file as of 07/22/2016.     Review of Systems  Constitutional: Negative for appetite  change and unexpected weight change.  HENT: Negative for congestion and sinus pressure.   Respiratory: Negative for cough, chest tightness and shortness of breath.   Cardiovascular: Negative for chest pain and palpitations.       Wearing her compression hose.  Some lower extremity swelling.    Gastrointestinal: Positive for constipation. Negative for abdominal pain, diarrhea, nausea and vomiting.  Genitourinary: Negative for difficulty urinating and dysuria.  Musculoskeletal: Negative for joint swelling.  Chronic back pain.    Skin: Negative for color change and rash.  Neurological: Negative for dizziness, light-headedness and headaches.  Psychiatric/Behavioral: Negative for agitation and dysphoric mood.       Objective:     Blood pressure rechecked by me:  142/68  Physical Exam  Constitutional: She appears well-developed and well-nourished. No distress.  HENT:  Nose: Nose normal.  Mouth/Throat: Oropharynx is clear and moist.  Neck: Neck supple. No thyromegaly present.  Cardiovascular: Normal rate and regular rhythm.   Pulmonary/Chest: Breath sounds normal. No respiratory distress. She has no wheezes.  Abdominal: Soft. Bowel sounds are normal. There is no tenderness.  Musculoskeletal: She exhibits no tenderness.  No increased edema present.  Wearing compression hose.    Lymphadenopathy:    She has no cervical adenopathy.  Skin: No rash noted. No erythema.  Persistent lesion - buttock.  Persistent lesion - lower back.   Psychiatric: She has a normal mood and affect. Her behavior is normal.    BP (!) 142/68   Pulse 74   Temp 98.1 F (36.7 C) (Oral)   Ht _0  (1.575 m)   Wt 207 lb 12.8 oz (94.3 kg)   SpO2 94%   BMI 38.01 kg/m  Wt Readings from Last 3 Encounters:  07/22/16 207 lb 12.8 oz (94.3 kg)  05/13/16 202 lb 12.8 oz (92 kg)  02/12/16 213 lb (96.6 kg)     Lab Results  Component Value Date   WBC 6.8 06/10/2016   HGB 10.7 (L) 06/10/2016   HCT 32.7 (L)  06/10/2016   PLT 195.0 06/10/2016   GLUCOSE 94 05/13/2016   CHOL 132 10/03/2015   TRIG 72.0 10/03/2015   HDL 45.50 10/03/2015   LDLCALC 72 10/03/2015   ALT 11 05/13/2016   AST 15 05/13/2016   NA 137 05/13/2016   K 3.7 05/13/2016   CL 99 05/13/2016   CREATININE 0.70 05/13/2016   BUN 15 05/13/2016   CO2 34 (H) 05/13/2016   TSH 1.216 02/12/2016   INR 1.0 12/08/2013   HGBA1C 6.0 10/19/2014    [place] Extrem Low Left Ltd  Result Date: 05/22/2016 CLINICAL DATA:  Acute knee pain. EXAM: ULTRASOUND LEFT LOWER EXTREMITY LIMITED TECHNIQUE: Ultrasound examination of the lower extremity soft tissues was performed in the area of clinical concern. COMPARISON:  Ultrasound 12/31/2013. FINDINGS: No cystic or solid lesions noted in the region of clinical concern over the left popliteal fossa. If symptoms persist MRI can be obtained for further evaluation. IMPRESSION: Negative exam. No cystic or solid lesions identified. If symptoms persist MRI can be obtained for further evaluation. Electronically Signed   By: Marcello Moores  Register   On: 05/22/2016 16:33       Assessment & Plan:   Problem List Items Addressed This Visit    Anemia    Follow cbc.  Has seen GI.  Has been referred to hematology.  Has upcoming appt.       Back pain    Chronic.  She is followed at [facility].  Will notify me of desires a change since MD retiring.        CHF (congestive heart failure) (HCC)    No evidence today on exam.  Has lasix if needed.  Follow.        Chronic constipation    Taking miralax and stool softeners.  Still with issues.  Discussed importance of staying hydrated.  Refer back to GI for further treatment.       Relevant Orders  Ambulatory referral to Gastroenterology   Hypercholesterolemia    On lipitor.  Low cholesterol diet and exercise.  Follow lipid panel and liver function tests.        Relevant Orders   Lipid panel   Hepatic function panel   Hypertension    Blood pressure elevated.  Discussed with her  today.  Will have her start hctz on a daily basis.  Follow pressures.  Follow metabolic panel.  Have her spot check her pressure.        Relevant Orders   Basic metabolic panel   Hypothyroidism    On thyroid replacement.  Follow tsh.        Leg swelling    Compression hose.  Leg elevation.  Follow.       Obesity (BMI 30-39.9)    Diet and exercise.        OSA (obstructive sleep apnea)    CPAP.       Pulmonary hypertension ([place])    Has seen pulmonary.  Continue CPAP.       Stress    Followed by Dr Nicolasa Ducking.  Still feels anxious at times.  Follow.            Einar Pheasant, MD

## 2016-07-28 ENCOUNTER — Encounter: Payer: Self-pay | Admitting: Internal Medicine

## 2016-07-28 NOTE — Assessment & Plan Note (Signed)
On thyroid replacement.  Follow tsh.

## 2016-07-28 NOTE — Assessment & Plan Note (Signed)
Has seen pulmonary.  Continue CPAP.

## 2016-07-28 NOTE — Assessment & Plan Note (Signed)
Followed by Dr Nicolasa Ducking.  Still feels anxious at times.  Follow.

## 2016-07-28 NOTE — Assessment & Plan Note (Signed)
CPAP.

## 2016-07-28 NOTE — Assessment & Plan Note (Signed)
On lipitor.  Low cholesterol diet and exercise.  Follow lipid panel and liver function tests.

## 2016-07-28 NOTE — Assessment & Plan Note (Signed)
Compression hose.  Leg elevation.  Follow.

## 2016-07-28 NOTE — Assessment & Plan Note (Signed)
Diet and exercise.

## 2016-07-28 NOTE — Assessment & Plan Note (Addendum)
Follow cbc.  Has seen GI.  Has been referred to hematology.  Has upcoming appt.

## 2016-07-28 NOTE — Assessment & Plan Note (Signed)
No evidence today on exam.  Has lasix if needed.  Follow.

## 2016-07-28 NOTE — Assessment & Plan Note (Signed)
Blood pressure elevated.  Discussed with her today.  Will have her start hctz on a daily basis.  Follow pressures.  Follow metabolic panel.  Have her spot check her pressure.

## 2016-07-28 NOTE — Assessment & Plan Note (Signed)
Chronic.  She is followed at [facility].  Will notify me of desires a change since MD retiring.

## 2016-07-28 NOTE — Assessment & Plan Note (Signed)
Taking miralax and stool softeners.  Still with issues.  Discussed importance of staying hydrated.  Refer back to GI for further treatment.

## 2016-07-30 ENCOUNTER — Other Ambulatory Visit (INDEPENDENT_AMBULATORY_CARE_PROVIDER_SITE_OTHER): Payer: PPO

## 2016-07-30 DIAGNOSIS — I5032 Chronic diastolic (congestive) heart failure: Secondary | ICD-10-CM | POA: Diagnosis not present

## 2016-07-30 DIAGNOSIS — Z9889 Other specified postprocedural states: Secondary | ICD-10-CM | POA: Diagnosis not present

## 2016-07-30 DIAGNOSIS — G4733 Obstructive sleep apnea (adult) (pediatric): Secondary | ICD-10-CM | POA: Diagnosis not present

## 2016-07-30 DIAGNOSIS — E7800 Pure hypercholesterolemia, unspecified: Secondary | ICD-10-CM

## 2016-07-30 DIAGNOSIS — I890 Lymphedema, not elsewhere classified: Secondary | ICD-10-CM | POA: Diagnosis not present

## 2016-07-30 DIAGNOSIS — R0609 Other forms of dyspnea: Secondary | ICD-10-CM | POA: Diagnosis not present

## 2016-07-30 DIAGNOSIS — I10 Essential (primary) hypertension: Secondary | ICD-10-CM | POA: Diagnosis not present

## 2016-07-30 DIAGNOSIS — I272 Pulmonary hypertension, unspecified: Secondary | ICD-10-CM | POA: Diagnosis not present

## 2016-07-30 LAB — LIPID PANEL
CHOLESTEROL: 147 mg/dL (ref 0–200)
HDL: 45 mg/dL (ref >39.00)
LDL CALC: 78 mg/dL (ref 0–99)
NonHDL: 102.16
TRIGLYCERIDES: 120 mg/dL (ref 0.0–149.0)
Total CHOL/HDL Ratio: 3
VLDL: 24 mg/dL (ref 0.0–40.0)

## 2016-07-30 LAB — HEPATIC FUNCTION PANEL
ALBUMIN: 4.2 g/dL (ref 3.5–5.2)
ALT: 12 U/L (ref 0–35)
AST: 17 U/L (ref 0–37)
Alkaline Phosphatase: 91 U/L (ref 39–117)
BILIRUBIN TOTAL: 0.4 mg/dL (ref 0.2–1.2)
Bilirubin, Direct: 0.1 mg/dL (ref 0.0–0.3)
TOTAL PROTEIN: 6.9 g/dL (ref 6.0–8.3)

## 2016-07-30 LAB — BASIC METABOLIC PANEL
BUN: 21 mg/dL (ref 6–23)
CALCIUM: 10 mg/dL (ref 8.4–10.5)
CO2: 35 mEq/L — ABNORMAL HIGH (ref 19–32)
Chloride: 100 mEq/L (ref 96–112)
Creatinine, Ser: 0.84 mg/dL (ref 0.40–1.20)
GFR: 68.75 mL/min (ref >60.00)
GLUCOSE: 89 mg/dL (ref 70–99)
POTASSIUM: 4.7 meq/L (ref 3.5–5.1)
SODIUM: 141 meq/L (ref 135–145)

## 2016-07-31 ENCOUNTER — Encounter: Payer: Self-pay | Admitting: Internal Medicine

## 2016-07-31 ENCOUNTER — Ambulatory Visit: Payer: PPO | Admitting: Internal Medicine

## 2016-07-31 DIAGNOSIS — Z79891 Long term (current) use of opiate analgesic: Secondary | ICD-10-CM | POA: Diagnosis not present

## 2016-07-31 DIAGNOSIS — M48061 Spinal stenosis, lumbar region without neurogenic claudication: Secondary | ICD-10-CM | POA: Diagnosis not present

## 2016-07-31 DIAGNOSIS — J449 Chronic obstructive pulmonary disease, unspecified: Secondary | ICD-10-CM | POA: Diagnosis not present

## 2016-08-02 ENCOUNTER — Ambulatory Visit (INDEPENDENT_AMBULATORY_CARE_PROVIDER_SITE_OTHER): Payer: PPO

## 2016-08-02 VITALS — HR 65 | Temp 97.4°F | Resp 14 | Ht 62.0 in | Wt 196.1 lb

## 2016-08-02 DIAGNOSIS — Z0000 Encounter for general adult medical examination without abnormal findings: Secondary | ICD-10-CM

## 2016-08-02 NOTE — Patient Instructions (Addendum)
  Ms. Furnas , Thank you for taking time to come for your Medicare Wellness Visit. I appreciate your ongoing commitment to your health goals. Please review the following plan we discussed and let me know if I can assist you in the future.   FOLLOW UP WITH DR. Nicki Reaper AS NEEDED.  These are the goals we discussed: Goals    . Increase physical activity          Water exercises up to 2 days a week , 30 minutes each session        This is a list of the screening recommended for you and due dates:  Health Maintenance  Topic Date Due  . Tetanus Vaccine  08/02/2016*  . Shingles Vaccine  08/02/2017*  . Mammogram  08/28/2016  . Flu Shot  Completed  . DEXA scan (bone density measurement)  Completed  . Pneumonia vaccines  Completed  *Topic was postponed. The date shown is not the original due date.

## 2016-08-02 NOTE — Progress Notes (Addendum)
Subjective:   Kimberly Roy is a 82 y.o. female who presents for Medicare Annual (Subsequent) preventive examination.  Review of Systems:  No ROS.  Medicare Wellness Visit.  Cardiac Risk Factors include: obesity (BMI >30kg/m2);advanced age (>52mn, >>66women);hypertension     Objective:     Vitals: Pulse 65   Temp 97.4 F (36.3 C) (Oral)   Resp 14   Ht _0  (1.575 m)   Wt 196 lb 1.9 oz (89 kg)   SpO2 95%   BMI 35.87 kg/m   Body mass index is 35.87 kg/m.   Tobacco History  Smoking Status  . Never Smoker  Smokeless Tobacco  . Never Used     Counseling given: Not Answered   Past Medical History:  Diagnosis Date  . Anemia   . Anxiety   . Chest pain   . [place]) [place]   . Constipation   . DDD (degenerative disc disease), cervical   . Depression   . DVT (deep [place] thrombosis) ([place]   . Dysphonia   . Dyspnea   . Fatty liver   . Fatty liver   . Headache   . Hyperlipidemia   . Hyperpiesia   . Hypertension   . Hypothyroidism   . Interstitial cystitis   . Left ventricular dysfunction   . Lymphedema   . Nephrolithiasis   . Obstructive sleep apnea   . Osteoarthritis    knees/cervical and lumbar spine  . Pulmonary hypertension   . Pulmonary nodules    followed by Dr FRaul Del . Pure hypercholesterolemia   . Renal cyst    right   Past Surgical History:  Procedure Laterality Date  . ABDOMINAL HYSTERECTOMY     ovaries left in place  . APPENDECTOMY    . Back Surgeries    . BREAST REDUCTION SURGERY     3/99  . CARDIAC CATHETERIZATION    . cataracts Bilateral   . CERVICAL SPINE SURGERY    . ESOPHAGEAL MANOMETRY N/A 08/02/2015   Procedure: ESOPHAGEAL MANOMETRY (EM);  Surgeon: MJosefine Class MD;  [facility];  Service: Endoscopy;  Laterality: N/A;  . ESOPHAGOGASTRODUODENOSCOPY N/A 02/27/2015   Procedure: ESOPHAGOGASTRODUODENOSCOPY (EGD);  Surgeon: PHulen Luster MD;  [facility];  Service:  Gastroenterology;  Laterality: N/A;  . EXCISIONAL HEMORRHOIDECTOMY    . HEMORRHOID SURGERY    . HIP SURGERY  2013   Right hip surgery  . KNEE ARTHROSCOPY     left and right  . REDUCTION MAMMAPLASTY Bilateral   . REPLACEMENT TOTAL KNEE Bilateral   . rotator cuff surgery     blilateral  . TONSILECTOMY/ADENOIDECTOMY WITH MYRINGOTOMY     Family History  Problem Relation Age of Onset  . Heart disease Mother   . Stroke Mother   . Hypertension Mother   . Heart disease Father     myocardial infarction age 80  History  Sexual Activity  . Sexual activity: Not Currently    Outpatient Encounter Prescriptions as of 08/02/2016  Medication Sig  . aluminum-magnesium hydroxide-simethicone (MAALOX) 2329-924-26MG/5ML SUSP Take by mouth.  .Marland KitchenamLODipine (NORVASC) 10 MG tablet TAKE ONE (1) TABLET EACH DAY  . atorvastatin (LIPITOR) 20 MG tablet TAKE ONE TABLET AT BEDTIME  . benazepril (LOTENSIN) 40 MG tablet TAKE ONE (1) TABLET EACH DAY  . Calcium-Magnesium-Vitamin D 600-40-500 MG-MG-UNIT TB24 Take 630 mg by mouth 4 (four) times daily.  .Sarajane MarekSodium 30-100 MG CAPS Take by mouth.  .Marland Kitchen  cephALEXin (KEFLEX) 250 MG capsule Take 250 mg by mouth daily.  . Cholecalciferol (VITAMIN D3) 1000 UNITS CAPS Take by mouth.  . DULoxetine (CYMBALTA) 60 MG capsule Take 60 mg by mouth daily.  . fluticasone (FLONASE) 50 MCG/ACT nasal spray Place 2 sprays into the nose daily.  . furosemide (LASIX) 20 MG tablet TAKE ONE (1) TABLET EACH DAY AS NEEDED  . gabapentin (NEURONTIN) 300 MG capsule Take 300 mg by mouth 3 (three) times daily.  . hydrochlorothiazide (HYDRODIURIL) 25 MG tablet TAKE ONE (1) TABLET EACH DAY  . HYDROcodone-acetaminophen (NORCO) 10-325 MG per tablet Take 1 tablet by mouth every 4 (four) hours as needed.  . Inulin (FIBER CHOICE PO) Take 2 tablets by mouth daily. Chew tablets  . magnesium oxide (MAG-OX) 400 MG tablet Take 1 tablet (400 mg total) by mouth daily.  . metoprolol succinate  (TOPROL-XL) 100 MG 24 hr tablet TAKE ONE TABLET TWICE DAILY. TAKE WITH OR IMMEDIATELY FOLLOWING A MEAL  . morphine (MS CONTIN) 30 MG 12 hr tablet Take 30 mg by mouth daily. Takes around midday  . morphine (MS CONTIN) 60 MG 12 hr tablet Take 60 mg by mouth 2 (two) times daily.   . mupirocin ointment (BACTROBAN) 2 % Apply to affected area on back bid  . nystatin cream (MYCOSTATIN) Apply topically 2 (two) times daily.  . pantoprazole (PROTONIX) 40 MG tablet TAKE ONE TABLET TWICE DAILY  . polyethylene glycol powder (GLYCOLAX/MIRALAX) powder MIX 17 GRAMS (AS MARKED IN BOTTLE TOP) IN 8 OUNCES OF WATER, MIX AND DRINK ONCEA DAY AS DIRECTED.  . Probiotic Product (ALIGN PO) Take by mouth daily.  . ranitidine (ZANTAC) 150 MG tablet Take 150 mg by mouth 2 (two) times daily.  . Simethicone (GAS-X PO) Take by mouth.  . sodium chloride (OCEAN) 0.65 % nasal spray Place 1 spray into the nose as needed.  Marland Kitchen SYNTHROID 112 MCG tablet TAKE ONE (1) TABLET EACH DAY   No facility-administered encounter medications on file as of 08/02/2016.     Activities of Daily Living In your present state of health, do you have any difficulty performing the following activities: 08/02/2016  Hearing? Y  Vision? N  Difficulty concentrating or making decisions? Y  Walking or climbing stairs? Y  Dressing or bathing? Y  Doing errands, shopping? Y  Preparing Food and eating ? N  Using the Toilet? N  In the past six months, have you accidently leaked urine? N  Do you have problems with loss of bowel control? N  Managing your Medications? N  Managing your Finances? N  Housekeeping or managing your Housekeeping? Y  Some recent data might be hidden    Patient Care Team: Einar Pheasant, [name], [name], [name], MD (Otolaryngology)    Assessment:    This is a routine wellness examination for Kimberly Roy. The  goal of the wellness visit is to assist the patient how to close the gaps in care and create a preventative care plan for the patient.   Taking calcium VIT D as appropriate/Osteoporosis risk reviewed.  Medications reviewed; taking without issues or barriers.  Safety issues reviewed; smoke detectors in the home. No firearms in the home. Wears seatbelts when driving or riding with others. No violence in the home.  No identified risk were noted; The patient was oriented x 3; appropriate in dress and manner and no objective failures at ADL's or IADL's.  BMI; discussed the importance of a healthy diet, water intake and exercise. Educational material provided.  Health maintenance gap; closed.  Patient Concerns: None at this time. Follow up with PCP as needed.  Exercise Activities and Dietary recommendations Current Exercise Habits: Home exercise routine (Chair exercises. ROM.), Frequency (Times/Week): 6, Intensity: Mild  Goals    . Increase physical activity          Water exercises up to 2 days a week , 30 minutes each session       Fall Risk Fall Risk  08/02/2016 07/22/2016 05/13/2016 03/08/2015 02/23/2014  Falls in the past year? _0   Number falls in past yr: - - - - -  Risk for fall due to : - - - Impaired balance/gait -   Depression Screen PHQ 2/9 Scores 08/02/2016 07/22/2016 05/13/2016 03/08/2015  PHQ - 2 Score 0 0 0 1     Cognitive Function MMSE - Mini Mental State Exam 03/08/2015  Orientation to time 5  Orientation to Place 5  Registration 3  Attention/ Calculation 5  Recall 3  Language- name 2 objects 2  Language- repeat 1  Language- follow 3 step command 3  Language- read & follow direction 1  Write a sentence 1  Copy design 1  Total score 30     6CIT Screen 08/02/2016  What Year? 0 points  What month? 0 points  What time? 0 points  Count back from 20 0 points  Months in reverse 0 points  Repeat phrase 0 points  Total Score 0    Immunization  History  Administered Date(s) Administered  . Influenza Split 07/13/2009, 07/03/2012  . Influenza, High Dose Seasonal PF 05/13/2016  . Influenza,inj,Quad PF,36+ Mos 05/25/2013, 05/31/2014, 04/27/2015  . Pneumococcal Conjugate-13 11/29/2014  . Pneumococcal-Unspecified 06/22/2001   Screening Tests Health Maintenance  Topic Date Due  . TETANUS/TDAP  08/02/2016 (Originally 03/04/1950)  . ZOSTAVAX  08/02/2017 (Originally 03/04/1993)  . MAMMOGRAM  08/28/2016  . INFLUENZA VACCINE  Completed  . DEXA SCAN  Completed  . PNA vac Low Risk Adult  Completed      Plan:    End of life planning; Advance aging; Advanced directives discussed. Copy of current HCPOA/Living Will requested.  Medicare Attestation I have personally reviewed: The patient's medical and social history Their use of alcohol, tobacco or illicit drugs Their current medications and supplements The patient's functional ability including ADLs,fall risks, home safety risks, cognitive, and hearing and visual impairment Diet and physical activities Evidence for depression   The patient's weight, height, BMI, and visual acuity have been recorded in the chart.  I have made referrals and provided education to the patient based on review of the above and I have provided the patient with a written personalized care plan for preventive services.    During the course of the visit the patient was educated and counseled about the following appropriate screening and preventive services:   Vaccines to include Pneumoccal, Influenza, Hepatitis B, Td, Zostavax, HCV  Electrocardiogram  Cardiovascular Disease  Colorectal cancer screening  Bone density screening  Diabetes screening  Glaucoma screening  Mammography/PAP  Nutrition counseling   Patient Instructions (the written plan) was given to the patient.   Varney Biles, LPN  00/09/[id number]   Reviewed above information.  Agree with plan.  Dr Nicki Reaper

## 2016-08-07 ENCOUNTER — Inpatient Hospital Stay: Payer: PPO

## 2016-08-07 ENCOUNTER — Other Ambulatory Visit: Payer: Self-pay | Admitting: Internal Medicine

## 2016-08-07 ENCOUNTER — Inpatient Hospital Stay: Payer: PPO | Attending: Internal Medicine | Admitting: Internal Medicine

## 2016-08-07 DIAGNOSIS — E785 Hyperlipidemia, unspecified: Secondary | ICD-10-CM | POA: Diagnosis not present

## 2016-08-07 DIAGNOSIS — F419 Anxiety disorder, unspecified: Secondary | ICD-10-CM | POA: Diagnosis not present

## 2016-08-07 DIAGNOSIS — N3010 Interstitial cystitis (chronic) without hematuria: Secondary | ICD-10-CM | POA: Diagnosis not present

## 2016-08-07 DIAGNOSIS — R490 Dysphonia: Secondary | ICD-10-CM | POA: Insufficient documentation

## 2016-08-07 DIAGNOSIS — I10 Essential (primary) hypertension: Secondary | ICD-10-CM | POA: Diagnosis not present

## 2016-08-07 DIAGNOSIS — I890 Lymphedema, not elsewhere classified: Secondary | ICD-10-CM | POA: Diagnosis not present

## 2016-08-07 DIAGNOSIS — N3090 Cystitis, unspecified without hematuria: Secondary | ICD-10-CM | POA: Insufficient documentation

## 2016-08-07 DIAGNOSIS — G4730 Sleep apnea, unspecified: Secondary | ICD-10-CM | POA: Diagnosis not present

## 2016-08-07 DIAGNOSIS — E039 Hypothyroidism, unspecified: Secondary | ICD-10-CM | POA: Diagnosis not present

## 2016-08-07 DIAGNOSIS — N281 Cyst of kidney, acquired: Secondary | ICD-10-CM | POA: Insufficient documentation

## 2016-08-07 DIAGNOSIS — D500 Iron deficiency anemia secondary to blood loss (chronic): Secondary | ICD-10-CM | POA: Insufficient documentation

## 2016-08-07 DIAGNOSIS — R51 Headache: Secondary | ICD-10-CM | POA: Insufficient documentation

## 2016-08-07 DIAGNOSIS — Z1231 Encounter for screening mammogram for malignant neoplasm of breast: Secondary | ICD-10-CM

## 2016-08-07 DIAGNOSIS — E7800 Pure hypercholesterolemia, unspecified: Secondary | ICD-10-CM | POA: Insufficient documentation

## 2016-08-07 DIAGNOSIS — K5900 Constipation, unspecified: Secondary | ICD-10-CM | POA: Insufficient documentation

## 2016-08-07 DIAGNOSIS — R079 Chest pain, unspecified: Secondary | ICD-10-CM

## 2016-08-07 DIAGNOSIS — M5030 Other cervical disc degeneration, unspecified cervical region: Secondary | ICD-10-CM | POA: Insufficient documentation

## 2016-08-07 DIAGNOSIS — I272 Pulmonary hypertension, unspecified: Secondary | ICD-10-CM | POA: Diagnosis not present

## 2016-08-07 DIAGNOSIS — D179 Benign lipomatous neoplasm, unspecified: Secondary | ICD-10-CM | POA: Diagnosis not present

## 2016-08-07 DIAGNOSIS — I509 Heart failure, unspecified: Secondary | ICD-10-CM | POA: Insufficient documentation

## 2016-08-07 DIAGNOSIS — R918 Other nonspecific abnormal finding of lung field: Secondary | ICD-10-CM | POA: Diagnosis not present

## 2016-08-07 DIAGNOSIS — M1990 Unspecified osteoarthritis, unspecified site: Secondary | ICD-10-CM | POA: Diagnosis not present

## 2016-08-07 DIAGNOSIS — Z9049 Acquired absence of other specified parts of digestive tract: Secondary | ICD-10-CM | POA: Insufficient documentation

## 2016-08-07 DIAGNOSIS — M7989 Other specified soft tissue disorders: Secondary | ICD-10-CM | POA: Diagnosis not present

## 2016-08-07 DIAGNOSIS — R0602 Shortness of breath: Secondary | ICD-10-CM | POA: Insufficient documentation

## 2016-08-07 DIAGNOSIS — Z86718 Personal history of other venous thrombosis and embolism: Secondary | ICD-10-CM | POA: Insufficient documentation

## 2016-08-07 DIAGNOSIS — F329 Major depressive disorder, single episode, unspecified: Secondary | ICD-10-CM | POA: Insufficient documentation

## 2016-08-07 DIAGNOSIS — Z79899 Other long term (current) drug therapy: Secondary | ICD-10-CM

## 2016-08-07 DIAGNOSIS — Z87442 Personal history of urinary calculi: Secondary | ICD-10-CM | POA: Insufficient documentation

## 2016-08-07 LAB — URINALYSIS, COMPLETE (UACMP) WITH MICROSCOPIC
BILIRUBIN URINE: NEGATIVE
Bacteria, UA: NONE SEEN
GLUCOSE, UA: NEGATIVE mg/dL
HGB URINE DIPSTICK: NEGATIVE
KETONES UR: NEGATIVE mg/dL
Nitrite: NEGATIVE
PH: 6 (ref 5.0–8.0)
PROTEIN: NEGATIVE mg/dL
Specific Gravity, Urine: 1.019 (ref 1.005–1.030)

## 2016-08-07 LAB — COMPREHENSIVE METABOLIC PANEL
ALT: 14 U/L (ref 14–54)
AST: 26 U/L (ref 15–41)
Albumin: 4 g/dL (ref 3.5–5.0)
Alkaline Phosphatase: 87 U/L (ref 38–126)
Anion gap: 8 (ref 5–15)
BUN: 21 mg/dL — ABNORMAL HIGH (ref 6–20)
CHLORIDE: 98 mmol/L — AB (ref 101–111)
CO2: 32 mmol/L (ref 22–32)
Calcium: 9.3 mg/dL (ref 8.9–10.3)
Creatinine, Ser: 0.94 mg/dL (ref 0.44–1.00)
GFR, EST NON AFRICAN AMERICAN: 55 mL/min — AB (ref >60)
Glucose, Bld: 98 mg/dL (ref 65–99)
POTASSIUM: 3.6 mmol/L (ref 3.5–5.1)
SODIUM: 138 mmol/L (ref 135–145)
Total Bilirubin: <0.1 mg/dL — ABNORMAL LOW (ref 0.3–1.2)
Total Protein: 7.2 g/dL (ref 6.5–8.1)

## 2016-08-07 LAB — IRON AND TIBC
IRON: 44 ug/dL (ref 28–170)
SATURATION RATIOS: 14 % (ref 10.4–31.8)
TIBC: 305 ug/dL (ref 250–450)
UIBC: 261 ug/dL

## 2016-08-07 LAB — C-REACTIVE PROTEIN: CRP: <0.8 mg/dL (ref <1.0)

## 2016-08-07 LAB — CBC WITH DIFFERENTIAL/PLATELET
BASOS ABS: 0 10*3/uL (ref 0–0.1)
Basophils Relative: 0 %
EOS ABS: 0.2 10*3/uL (ref 0–0.7)
EOS PCT: 3 %
HCT: 32.3 % — ABNORMAL LOW (ref 35.0–47.0)
Hemoglobin: 11 g/dL — ABNORMAL LOW (ref 12.0–16.0)
LYMPHS ABS: 1.9 10*3/uL (ref 1.0–3.6)
LYMPHS PCT: 24 %
MCH: 32.4 pg (ref 26.0–34.0)
MCHC: 34.2 g/dL (ref 32.0–36.0)
MCV: 94.8 fL (ref 80.0–100.0)
MONO ABS: 0.6 10*3/uL (ref 0.2–0.9)
Monocytes Relative: 7 %
Neutro Abs: 5.2 10*3/uL (ref 1.4–6.5)
Neutrophils Relative %: 66 %
PLATELETS: 172 10*3/uL (ref 150–440)
RBC: 3.4 MIL/uL — ABNORMAL LOW (ref 3.80–5.20)
RDW: 14.1 % (ref 11.5–14.5)
WBC: 7.9 10*3/uL (ref 3.6–11.0)

## 2016-08-07 LAB — FERRITIN: FERRITIN: 58 ng/mL (ref 11–307)

## 2016-08-07 LAB — RETICULOCYTES
RBC.: 3.4 MIL/uL — ABNORMAL LOW (ref 3.80–5.20)
Retic Count, Absolute: 44.2 10*3/uL (ref 19.0–183.0)
Retic Ct Pct: 1.3 % (ref 0.4–3.1)

## 2016-08-07 LAB — LACTATE DEHYDROGENASE: LDH: 158 U/L (ref 98–192)

## 2016-08-07 NOTE — Progress Notes (Signed)
Patient is here for follow up, she is doing well

## 2016-08-07 NOTE — Progress Notes (Signed)
Kimberly Roy CONSULT NOTE  Patient Care Team: Einar Pheasant, [name], [name], [name], MD (Otolaryngology)  CHIEF COMPLAINTS/PURPOSE OF CONSULTATION: Anemia  #   No history exists.     HISTORY OF PRESENTING ILLNESS: pt is a poor historian.  Kimberly Roy 82 y.o.  female prior history of anemia followed by Dr. Inez Pilgrim in the [place] has been deferred was for further evaluation of anemia. Patient states that she had been "anemic all her life". Patient had previous colonoscopy EGD- does not remember when. Patient is not taking iron because of abdominal discomfort.  Patient denies any abdominal pain. Denies any dysuria. Denies any blood in urine. Denies any chest pain or shortness of the cochlea denies any difficulty swallowing. No constitutional diarrhea or blood in stools. Patient is chronic swelling in the legs. She is in a wheelchair.   ROS: A complete 10 point review of system is done which is negative except mentioned above in history of present illness  MEDICAL HISTORY:  Past Medical History:  Diagnosis Date  . Anemia   . Anxiety   . Chest pain   . CHF (congestive heart failure) ([place])   . Constipation   . DDD (degenerative disc disease), cervical   . Depression   . DVT (deep venous thrombosis) ([place])   . Dysphonia   . Dyspnea   . Fatty liver   . Fatty liver   . Headache   . Hyperlipidemia   . Hyperpiesia   . Hypertension   . Hypothyroidism   . Interstitial cystitis   . Left ventricular dysfunction   . Lymphedema   . Nephrolithiasis   . Obstructive sleep apnea   . Osteoarthritis    knees/cervical and lumbar spine  . Pulmonary hypertension   . Pulmonary nodules    followed by Dr Raul Del  . Pure hypercholesterolemia   . Renal cyst    right    SURGICAL HISTORY: Past Surgical History:  Procedure Laterality Date  .  ABDOMINAL HYSTERECTOMY     ovaries left in place  . APPENDECTOMY    . Back Surgeries    . BREAST REDUCTION SURGERY     3/99  . CARDIAC CATHETERIZATION    . cataracts Bilateral   . CERVICAL SPINE SURGERY    . ESOPHAGEAL MANOMETRY N/A 08/02/2015   Procedure: ESOPHAGEAL MANOMETRY (EM);  Surgeon: Josefine Class, MD;  [facility];  Service: Endoscopy;  Laterality: N/A;  . ESOPHAGOGASTRODUODENOSCOPY N/A 02/27/2015   Procedure: ESOPHAGOGASTRODUODENOSCOPY (EGD);  Surgeon: Hulen Luster, MD;  [facility];  Service: Gastroenterology;  Laterality: N/A;  . EXCISIONAL HEMORRHOIDECTOMY    . HEMORRHOID SURGERY    . HIP SURGERY  2013   Right hip surgery  . KNEE ARTHROSCOPY     left and right  . REDUCTION MAMMAPLASTY Bilateral   . REPLACEMENT TOTAL KNEE Bilateral   . rotator cuff surgery     blilateral  . TONSILECTOMY/ADENOIDECTOMY WITH MYRINGOTOMY      SOCIAL HISTORY: Social History   Social History  . Marital status: Married    Spouse name: N/A  . Number of children: 2  . Years of education: N/A   Occupational History  . Not on file.   Social History Main Topics  . Smoking status: Never Smoker  . Smokeless tobacco: Never Used  . Alcohol use No  . Drug use: No  . Sexual activity:  Not Currently   Other Topics Concern  . Not on file   Social History Narrative  . No narrative on file    FAMILY HISTORY: Family History  Problem Relation Age of Onset  . Heart disease Mother   . Stroke Mother   . Hypertension Mother   . Heart disease Father     myocardial infarction age 34    ALLERGIES:  is allergic to lyrica [pregabalin]; atarax [hydroxyzine]; dicyclomine; hydroxyzine hcl; levaquin [levofloxacin]; nucynta er [tapentadol hcl er]; oxybutynin; zoloft [sertraline hcl]; biaxin [clarithromycin]; sertraline; sulfa antibiotics; tape; and tapentadol.  MEDICATIONS:  Current Outpatient Prescriptions  Medication Sig Dispense Refill  . aluminum-magnesium  hydroxide-simethicone (MAALOX) 824-235-36 MG/5ML SUSP Take by mouth.    Marland Kitchen amLODipine (NORVASC) 10 MG tablet TAKE ONE (1) TABLET EACH DAY 90 tablet 1  . atorvastatin (LIPITOR) 20 MG tablet TAKE ONE TABLET AT BEDTIME 90 tablet 3  . benazepril (LOTENSIN) 40 MG tablet TAKE ONE (1) TABLET EACH DAY 90 tablet 3  . Calcium-Magnesium-Vitamin D 600-40-500 MG-MG-UNIT TB24 Take 630 mg by mouth 4 (four) times daily.    [name] Sodium 30-100 MG CAPS Take by mouth.    . cephALEXin (KEFLEX) 250 MG capsule Take 250 mg by mouth daily.    . Cholecalciferol (VITAMIN D3) 1000 UNITS CAPS Take by mouth.    . DULoxetine (CYMBALTA) 60 MG capsule Take 60 mg by mouth daily.    . fluticasone (FLONASE) 50 MCG/ACT nasal spray Place 2 sprays into the nose daily.    . furosemide (LASIX) 20 MG tablet TAKE ONE (1) TABLET EACH DAY AS NEEDED 30 tablet 3  . gabapentin (NEURONTIN) 300 MG capsule Take 300 mg by mouth 3 (three) times daily.    . hydrochlorothiazide (HYDRODIURIL) 25 MG tablet TAKE ONE (1) TABLET EACH DAY 90 tablet 1  . HYDROcodone-acetaminophen (NORCO) 10-325 MG per tablet Take 1 tablet by mouth every 4 (four) hours as needed.    . Inulin (FIBER CHOICE PO) Take 2 tablets by mouth daily. Chew tablets    . magnesium oxide (MAG-OX) 400 MG tablet Take 1 tablet (400 mg total) by mouth daily. 30 tablet 1  . metoprolol succinate (TOPROL-XL) 100 MG 24 hr tablet TAKE ONE TABLET TWICE DAILY. TAKE WITH OR IMMEDIATELY FOLLOWING A MEAL 180 tablet 3  . morphine (MS CONTIN) 30 MG 12 hr tablet Take 30 mg by mouth daily. Takes around midday    . morphine (MS CONTIN) 60 MG 12 hr tablet Take 60 mg by mouth 2 (two) times daily.     Marland Kitchen nystatin cream (MYCOSTATIN) Apply topically 2 (two) times daily. 30 g 1  . pantoprazole (PROTONIX) 40 MG tablet TAKE ONE TABLET TWICE DAILY 60 tablet 11  . polyethylene glycol powder (GLYCOLAX/MIRALAX) powder MIX 17 GRAMS (AS MARKED IN BOTTLE TOP) IN 8 OUNCES OF WATER, MIX AND DRINK ONCEA DAY  AS DIRECTED. 527 g 1  . Probiotic Product (ALIGN PO) Take by mouth daily.    . ranitidine (ZANTAC) 150 MG tablet Take 150 mg by mouth 2 (two) times daily.    . Simethicone (GAS-X PO) Take by mouth.    . sodium chloride (OCEAN) 0.65 % nasal spray Place 1 spray into the nose as needed.    Marland Kitchen SYNTHROID 112 MCG tablet TAKE ONE (1) TABLET EACH DAY 90 tablet 3  . mupirocin ointment (BACTROBAN) 2 % Apply to affected area on back bid (Patient not taking: Reported on 08/07/2016) 22 g 0   No current facility-administered  medications for this visit.       Marland Kitchen  PHYSICAL EXAMINATION: ECOG PERFORMANCE STATUS: 0 - Asymptomatic  Vitals:   08/07/16 1449  BP: (!) 145/70  Pulse: 61  Resp: 18  Temp: 97.7 F (36.5 C)   Filed Weights   08/07/16 1449  Weight: 212 lb (96.2 kg)    GENERAL: Well-nourished well-developed; Alert, no distress and comfortable.  Obese. Alone. A wheelchair. EYES: no pallor or icterus OROPHARYNX: no thrush or ulceration.   NECK: supple, no masses felt LYMPH:  no palpable lymphadenopathy in the cervical, axillary or inguinal regions LUNGS: clear to auscultation and  No wheeze or crackles HEART/CVS: regular rate & rhythm and no murmurs; No lower extremity edema ABDOMEN: abdomen soft, non-tender and normal bowel sounds Musculoskeletal:no cyanosis of digits and no clubbing  PSYCH: alert & oriented x 3 with fluent speech NEURO: no focal motor/sensory deficits SKIN:  no rashes or significant lesions  LABORATORY DATA:  I have reviewed the data as listed Lab Results  Component Value Date   WBC 7.9 08/07/2016   HGB 11.0 (L) 08/07/2016   HCT 32.3 (L) 08/07/2016   MCV 94.8 08/07/2016   PLT 172 08/07/2016    Recent Labs  01/20/16 2105 02/12/16 1659 05/13/16 1435 07/30/16 0955 08/07/16 1553  NA 135 137 137 141 138  K 3.7 4.0 3.7 4.7 3.6  CL 96* 98* 99 100 98*  CO2 31 31 34* 35* 32  GLUCOSE 123* 94 94 89 98  BUN 24* _0 21*  CREATININE 0.94 0.84 0.70 0.84 0.94   CALCIUM 10.0 9.5 9.4 10.0 9.3  GFRNONAA 55* >60  --   --  55*  GFRAA >60 >60  --   --  >60  PROT  --  6.6 7.0 6.9 7.2  ALBUMIN  --  3.8 4.0 4.2 4.0  AST  --  _1 ALT  --  _2 ALKPHOS  --  68 78 91 87  BILITOT  --  0.4 0.3 0.4 <0.1*  BILIDIR  --  <0.1* 0.1 0.1  --   IBILI  --  NOT CALCULATED  --   --   --     RADIOGRAPHIC STUDIES: I have personally reviewed the radiological images as listed and agreed with the findings in the report. No results found.  ASSESSMENT & PLAN:   Anemia due to blood loss, chronic Chronic anemia unclear etiology. We'll check CBC and iron studies ferritin with her folic acid; monoclonal gammopathy;. Check UA.   # Discussed with the patient that if there is significant decline in her hemoglobin would recommend a bone marrow biopsy for further evaluation. However HOLD it for now.    # Patient follow-up with me in one to 2 weeks to review the above lab work; next plan of care.  Thank you Dr.Scott for allowing me to participate in the care of your pleasant patient. Please do not hesitate to contact me with questions or concerns in the interim.   All questions were answered. The patient knows to call the clinic with any problems, questions or concerns.    Cammie Sickle, MD 08/08/2016 3:00 PM

## 2016-08-08 ENCOUNTER — Other Ambulatory Visit: Payer: Self-pay | Admitting: *Deleted

## 2016-08-08 ENCOUNTER — Telehealth: Payer: Self-pay | Admitting: *Deleted

## 2016-08-08 DIAGNOSIS — N390 Urinary tract infection, site not specified: Secondary | ICD-10-CM

## 2016-08-08 DIAGNOSIS — D500 Iron deficiency anemia secondary to blood loss (chronic): Secondary | ICD-10-CM | POA: Diagnosis not present

## 2016-08-08 LAB — KAPPA/LAMBDA LIGHT CHAINS
Kappa free light chain: 27.3 mg/L — ABNORMAL HIGH (ref 3.3–19.4)
Kappa, lambda light chain ratio: 1.32 (ref 0.26–1.65)
Lambda free light chains: 20.7 mg/L (ref 5.7–26.3)

## 2016-08-08 LAB — ERYTHROPOIETIN: Erythropoietin: 21.7 m[IU]/mL — ABNORMAL HIGH (ref 2.6–18.5)

## 2016-08-08 LAB — HAPTOGLOBIN: Haptoglobin: 190 mg/dL (ref 34–200)

## 2016-08-08 MED ORDER — CIPROFLOXACIN HCL 500 MG PO TABS: 500.0000 mg | ORAL_TABLET | Freq: Two times a day (BID) | ORAL | 0 refills | Status: DC

## 2016-08-08 NOTE — Progress Notes (Signed)
Contacted pt via phone call. Spoke patient. Discussed her urine results and her blood test results with patient. Explained to patient that the myleoma panels are still pending.  Educated patient on potential side effects of Cipro.  Discussed allergy contraindications. Pt states that it is on her chart that she has a sulfa allergy but she does not remember this occurring. "it must have occurred as a child or something because I've taken cipro before and never had a problem with it."  I asked patient to drink plenty of fluids with Cipro.  RX for ciprofloxacin 500 mg.

## 2016-08-08 NOTE — Telephone Encounter (Signed)
Contacted pt via phone call. Spoke patient. Discussed her urine results and her blood test results with patient. Explained to patient that the myleoma panels are still pending.  Educated patient on potential side effects of Cipro.  Discussed allergy contraindications. Pt states that it is on her chart that she has a sulfa allergy but she does not remember this occurring. "it must have occurred as a child or something because I've taken cipro before and never had a problem with it."  I asked patient to drink plenty of fluids with Cipro.  RX for ciprofloxacin 500 mg.

## 2016-08-08 NOTE — Assessment & Plan Note (Addendum)
Chronic anemia unclear etiology. We'll check CBC and iron studies ferritin with her folic acid; monoclonal gammopathy;. Check UA.   # Discussed with the patient that if there is significant decline in her hemoglobin would recommend a bone marrow biopsy for further evaluation. However HOLD it for now.    # Patient follow-up with me in one to 2 weeks to review the above lab work; next plan of care.  Thank you Dr.Scott for allowing me to participate in the care of your pleasant patient. Please do not hesitate to contact me with questions or concerns in the interim.

## 2016-08-08 NOTE — Telephone Encounter (Signed)
-----  Message from Cammie Sickle, MD sent at 08/08/2016  3:03 PM EST ----- likley UTI- Please call in prescription for ciprofloxacin 500 mg twice a day x5 days. Thx

## 2016-08-09 LAB — MULTIPLE MYELOMA PANEL, SERUM
ALBUMIN SERPL ELPH-MCNC: 3.6 g/dL (ref 2.9–4.4)
Albumin/Glob SerPl: 1.3 (ref 0.7–1.7)
Alpha 1: 0.3 g/dL (ref 0.0–0.4)
Alpha2 Glob SerPl Elph-Mcnc: 0.8 g/dL (ref 0.4–1.0)
B-Globulin SerPl Elph-Mcnc: 1.1 g/dL (ref 0.7–1.3)
GAMMA GLOB SERPL ELPH-MCNC: 0.9 g/dL (ref 0.4–1.8)
GLOBULIN, TOTAL: 3 g/dL (ref 2.2–3.9)
IGA: 256 mg/dL (ref 64–422)
IGM, SERUM: 58 mg/dL (ref 26–217)
IgG (Immunoglobin G), Serum: 891 mg/dL (ref 700–1600)
Total Protein ELP: 6.6 g/dL (ref 6.0–8.5)

## 2016-08-13 ENCOUNTER — Encounter: Payer: Self-pay | Admitting: Internal Medicine

## 2016-08-13 DIAGNOSIS — M5440 Lumbago with sciatica, unspecified side: Secondary | ICD-10-CM

## 2016-08-13 DIAGNOSIS — R252 Cramp and spasm: Secondary | ICD-10-CM

## 2016-08-13 DIAGNOSIS — G8929 Other chronic pain: Secondary | ICD-10-CM

## 2016-08-14 DIAGNOSIS — D485 Neoplasm of uncertain behavior of skin: Secondary | ICD-10-CM | POA: Diagnosis not present

## 2016-08-14 DIAGNOSIS — L820 Inflamed seborrheic keratosis: Secondary | ICD-10-CM | POA: Diagnosis not present

## 2016-08-14 DIAGNOSIS — M533 Sacrococcygeal disorders, not elsewhere classified: Secondary | ICD-10-CM | POA: Diagnosis not present

## 2016-08-14 DIAGNOSIS — L298 Other pruritus: Secondary | ICD-10-CM | POA: Diagnosis not present

## 2016-08-14 NOTE — Telephone Encounter (Signed)
Order placed for neurology referral.  Pt notified via my chart.

## 2016-08-15 ENCOUNTER — Encounter: Payer: Self-pay | Admitting: Pulmonary Disease

## 2016-08-15 ENCOUNTER — Ambulatory Visit (INDEPENDENT_AMBULATORY_CARE_PROVIDER_SITE_OTHER): Payer: PPO | Admitting: Pulmonary Disease

## 2016-08-15 VITALS — BP 136/82 | HR 74 | Ht 62.0 in | Wt 213.4 lb

## 2016-08-15 DIAGNOSIS — I272 Pulmonary hypertension, unspecified: Secondary | ICD-10-CM | POA: Diagnosis not present

## 2016-08-15 DIAGNOSIS — G4733 Obstructive sleep apnea (adult) (pediatric): Secondary | ICD-10-CM

## 2016-08-15 NOTE — Patient Instructions (Signed)
For your sleep apnea: We will order CPAP supplies again Keep using her CPAP machine every night Don't drive while feeling sleepy Let us know if you have any trouble over the next year and we can see you sooner, otherwise we will see you back in one year

## 2016-08-15 NOTE — Progress Notes (Signed)
Subjective:    Patient ID: Kimberly Roy, female    DOB: 1931-06-14, 82 y.o.   MRN: [id number]  Synopsis: Kimberly Roy first saw the [facility] pulmonary clinic in November of 2013 for evaluation of pulmonary hypertension. In November 2012 she had a left heart cath and right heart cath. Her left heart catheterization showed no obstructive coronary disease the right heart cath showed a mean PA pressure of 35. This is associated with a cardiac index of 2.99 as well as a wedge pressure of 21. An echocardiogram at that time showed an LVEF of 40-45% and right ventricular dilation. She had used Adcirca but this did not improve her symptoms. She noted symptoms consistent with obstructive sleep apnea and never had a polysomnogram.  HPI  Chief Complaint  Patient presents with  . Follow-up    Denies any recent breathing issues. Wears CPAP nightly. Denies problems with mask/pressure. DME: Valerie [name] has been doing well.  No breathing trouble lately, still using her CPAP machine every night. She needs a new mask because she says she has a little leak around her chin.  She still feels well rested, no excessive sleepiness in the daytime, no morning headaches.    Past Medical History:  Diagnosis Date  . Anemia   . Anxiety   . Chest pain   . CHF (congestive heart failure) ([place])   . Constipation   . DDD (degenerative disc disease), cervical   . Depression   . DVT (deep venous thrombosis) ([place])   . Dysphonia   . Dyspnea   . Fatty liver   . Fatty liver   . Headache   . Hyperlipidemia   . Hyperpiesia   . Hypertension   . Hypothyroidism   . Interstitial cystitis   . Left ventricular dysfunction   . Lymphedema   . Nephrolithiasis   . Obstructive sleep apnea   . Osteoarthritis    knees/cervical and lumbar spine  . Pulmonary hypertension   . Pulmonary nodules    followed by Dr Raul Del  . Pure hypercholesterolemia   . Renal cyst    right    Review of Systems  Constitutional: Positive  for fatigue. Negative for chills and fever.  HENT: Negative for congestion, postnasal drip, rhinorrhea and sinus pressure.   Respiratory: Negative for cough, shortness of breath and wheezing.   Cardiovascular: Positive for leg swelling. Negative for chest pain and palpitations.       Objective:   Physical Exam  Vitals:   08/15/16 1341  BP: 136/82  BP Location: Left Arm  Cuff Size: Normal  Pulse: 74  SpO2: 93%  Weight: 213 lb 6.4 oz (96.8 kg)  Height: _0  (1.575 m)  RA  Gen: obese, chronically ill appearing, no acute distress HEENT: NCAT, OP clear PULM: CTA B, normal effort CV: RRR, systolic murmur RUSB, no JVD GI: BS+, soft, nontender, no hsm MSK: trace ankle edema, no clubbing, no cyanosis Derm: No no rash or skin breakdown  03/2012 Full PFT >> Ratio 82%, FEV 1.45 L (82%), TLC 2.99L (72% pred) DLCO 53% pred 07/2011 LHC >> normal coronary anatomy 07/2011 RHC >> Wedge 21; PA mean 35, 2.99 woods units; CO 4.6 L/min, CI 2.3 L/min/m2 07/2011 TTE >> mild RV dilation, LVEF 40-45%  12/29/2012 EKG > poor study due to TENS unit, no clear ST wave changes     Assessment & Plan:   OSA (obstructive sleep apnea) This has been a stable interval for her.  She has no symptoms while using CPAP regularly.  For your sleep apnea: We will order CPAP supplies again Keep using her CPAP machine every night Don't drive while feeling sleepy Let us know if you have any trouble over the next year and we can see you sooner, otherwise we will see you back in one year  Pulmonary hypertension ([place]) She has combined [employer] group 2 and group 3 disease. There is no role for pulmonary vasodilators.  Continue expectant management.   Updated Medication List Outpatient Encounter Prescriptions as of 08/15/2016  Medication Sig Dispense Refill  . aluminum-magnesium hydroxide-simethicone (MAALOX) [phone] MG/5ML SUSP Take by mouth.    Marland Kitchen amLODipine (NORVASC) 10 MG tablet TAKE ONE (1)  TABLET EACH DAY 90 tablet 1  . atorvastatin (LIPITOR) 20 MG tablet TAKE ONE TABLET AT BEDTIME 90 tablet 3  . benazepril (LOTENSIN) 40 MG tablet TAKE ONE (1) TABLET EACH DAY 90 tablet 3  . Calcium-Magnesium-Vitamin D 600-40-500 MG-MG-UNIT TB24 Take 630 mg by mouth 4 (four) times daily.    [name] Sodium 30-100 MG CAPS Take by mouth.    . cephALEXin (KEFLEX) 250 MG capsule Take 250 mg by mouth daily.    . Cholecalciferol (VITAMIN D3) 1000 UNITS CAPS Take by mouth.    . ciprofloxacin (CIPRO) 500 MG tablet Take 1 tablet (500 mg total) by mouth 2 (two) times daily. 10 tablet 0  . DULoxetine (CYMBALTA) 60 MG capsule Take 60 mg by mouth daily.    . fluticasone (FLONASE) 50 MCG/ACT nasal spray Place 2 sprays into the nose daily.    . furosemide (LASIX) 20 MG tablet TAKE ONE (1) TABLET EACH DAY AS NEEDED 30 tablet 3  . gabapentin (NEURONTIN) 300 MG capsule Take 300 mg by mouth 3 (three) times daily.    . hydrochlorothiazide (HYDRODIURIL) 25 MG tablet TAKE ONE (1) TABLET EACH DAY 90 tablet 1  . HYDROcodone-acetaminophen (NORCO) 10-325 MG per tablet Take 1 tablet by mouth every 4 (four) hours as needed.    . Inulin (FIBER CHOICE PO) Take 2 tablets by mouth daily. Chew tablets    . magnesium oxide (MAG-OX) 400 MG tablet Take 1 tablet (400 mg total) by mouth daily. 30 tablet 1  . metoprolol succinate (TOPROL-XL) 100 MG 24 hr tablet TAKE ONE TABLET TWICE DAILY. TAKE WITH OR IMMEDIATELY FOLLOWING A MEAL 180 tablet 3  . morphine (MS CONTIN) 30 MG 12 hr tablet Take 30 mg by mouth daily. Takes around midday    . morphine (MS CONTIN) 60 MG 12 hr tablet Take 60 mg by mouth 2 (two) times daily.     . mupirocin ointment (BACTROBAN) 2 % Apply to affected area on back bid 22 g 0  . nystatin cream (MYCOSTATIN) Apply topically 2 (two) times daily. 30 g 1  . pantoprazole (PROTONIX) 40 MG tablet TAKE ONE TABLET TWICE DAILY 60 tablet 11  . polyethylene glycol powder (GLYCOLAX/MIRALAX) powder MIX 17 GRAMS  (AS MARKED IN BOTTLE TOP) IN 8 OUNCES OF WATER, MIX AND DRINK ONCEA DAY AS DIRECTED. 527 g 1  . Probiotic Product (ALIGN PO) Take by mouth daily.    . ranitidine (ZANTAC) 150 MG tablet Take 150 mg by mouth 2 (two) times daily.    . Simethicone (GAS-X PO) Take by mouth.    . sodium chloride (OCEAN) 0.65 % nasal spray Place 1 spray into the nose as needed.    Marland Kitchen SYNTHROID 112 MCG tablet TAKE ONE (1) TABLET EACH DAY 90  tablet 3   No facility-administered encounter medications on file as of 08/15/2016.

## 2016-08-15 NOTE — Assessment & Plan Note (Signed)
She has combined [employer] group 2 and group 3 disease. There is no role for pulmonary vasodilators.  Continue expectant management.

## 2016-08-15 NOTE — Assessment & Plan Note (Signed)
This has been a stable interval for her. She has no symptoms while using CPAP regularly.  For your sleep apnea: We will order CPAP supplies again Keep using her CPAP machine every night Don't drive while feeling sleepy Let us know if you have any trouble over the next year and we can see you sooner, otherwise we will see you back in one year

## 2016-08-18 ENCOUNTER — Encounter: Payer: Self-pay | Admitting: Internal Medicine

## 2016-08-18 DIAGNOSIS — D500 Iron deficiency anemia secondary to blood loss (chronic): Secondary | ICD-10-CM | POA: Diagnosis not present

## 2016-08-21 ENCOUNTER — Inpatient Hospital Stay: Payer: PPO

## 2016-08-21 ENCOUNTER — Inpatient Hospital Stay (HOSPITAL_BASED_OUTPATIENT_CLINIC_OR_DEPARTMENT_OTHER): Payer: PPO | Admitting: Internal Medicine

## 2016-08-21 ENCOUNTER — Other Ambulatory Visit: Payer: Self-pay | Admitting: *Deleted

## 2016-08-21 VITALS — BP 147/70 | HR 61 | Temp 97.5°F | Resp 18 | Wt 216.0 lb

## 2016-08-21 DIAGNOSIS — Z87442 Personal history of urinary calculi: Secondary | ICD-10-CM

## 2016-08-21 DIAGNOSIS — D500 Iron deficiency anemia secondary to blood loss (chronic): Secondary | ICD-10-CM

## 2016-08-21 DIAGNOSIS — F329 Major depressive disorder, single episode, unspecified: Secondary | ICD-10-CM

## 2016-08-21 DIAGNOSIS — I272 Pulmonary hypertension, unspecified: Secondary | ICD-10-CM

## 2016-08-21 DIAGNOSIS — R490 Dysphonia: Secondary | ICD-10-CM

## 2016-08-21 DIAGNOSIS — Z79899 Other long term (current) drug therapy: Secondary | ICD-10-CM

## 2016-08-21 DIAGNOSIS — I890 Lymphedema, not elsewhere classified: Secondary | ICD-10-CM

## 2016-08-21 DIAGNOSIS — R918 Other nonspecific abnormal finding of lung field: Secondary | ICD-10-CM

## 2016-08-21 DIAGNOSIS — N3010 Interstitial cystitis (chronic) without hematuria: Secondary | ICD-10-CM

## 2016-08-21 DIAGNOSIS — I509 Heart failure, unspecified: Secondary | ICD-10-CM

## 2016-08-21 DIAGNOSIS — M1990 Unspecified osteoarthritis, unspecified site: Secondary | ICD-10-CM

## 2016-08-21 DIAGNOSIS — R51 Headache: Secondary | ICD-10-CM

## 2016-08-21 DIAGNOSIS — Z86718 Personal history of other venous thrombosis and embolism: Secondary | ICD-10-CM

## 2016-08-21 DIAGNOSIS — I10 Essential (primary) hypertension: Secondary | ICD-10-CM

## 2016-08-21 DIAGNOSIS — F419 Anxiety disorder, unspecified: Secondary | ICD-10-CM | POA: Diagnosis not present

## 2016-08-21 DIAGNOSIS — K5900 Constipation, unspecified: Secondary | ICD-10-CM

## 2016-08-21 DIAGNOSIS — N3090 Cystitis, unspecified without hematuria: Secondary | ICD-10-CM

## 2016-08-21 DIAGNOSIS — E785 Hyperlipidemia, unspecified: Secondary | ICD-10-CM

## 2016-08-21 DIAGNOSIS — M7989 Other specified soft tissue disorders: Secondary | ICD-10-CM

## 2016-08-21 DIAGNOSIS — E7800 Pure hypercholesterolemia, unspecified: Secondary | ICD-10-CM

## 2016-08-21 DIAGNOSIS — E039 Hypothyroidism, unspecified: Secondary | ICD-10-CM

## 2016-08-21 DIAGNOSIS — M5030 Other cervical disc degeneration, unspecified cervical region: Secondary | ICD-10-CM

## 2016-08-21 DIAGNOSIS — R079 Chest pain, unspecified: Secondary | ICD-10-CM

## 2016-08-21 DIAGNOSIS — R0602 Shortness of breath: Secondary | ICD-10-CM

## 2016-08-21 DIAGNOSIS — D179 Benign lipomatous neoplasm, unspecified: Secondary | ICD-10-CM

## 2016-08-21 DIAGNOSIS — G4730 Sleep apnea, unspecified: Secondary | ICD-10-CM

## 2016-08-21 DIAGNOSIS — N281 Cyst of kidney, acquired: Secondary | ICD-10-CM

## 2016-08-21 DIAGNOSIS — Z9049 Acquired absence of other specified parts of digestive tract: Secondary | ICD-10-CM

## 2016-08-21 LAB — URINALYSIS, COMPLETE (UACMP) WITH MICROSCOPIC
BACTERIA UA: NONE SEEN
Glucose, UA: NEGATIVE mg/dL
Hgb urine dipstick: NEGATIVE
KETONES UR: NEGATIVE mg/dL
LEUKOCYTES UA: NEGATIVE
Nitrite: NEGATIVE
Protein, ur: 30 mg/dL — AB
SPECIFIC GRAVITY, URINE: 1.024 (ref 1.005–1.030)
pH: 6 (ref 5.0–8.0)

## 2016-08-21 LAB — OCCULT BLOOD X 1 CARD TO LAB, STOOL
FECAL OCCULT BLD: NEGATIVE
Fecal Occult Bld: NEGATIVE

## 2016-08-21 NOTE — Progress Notes (Signed)
Patient here today for test results.

## 2016-08-21 NOTE — Progress Notes (Signed)
[facility] CONSULT NOTE  Patient Care Team: Einar Pheasant, [name], [name], [name], MD (Otolaryngology)  CHIEF COMPLAINTS/PURPOSE OF CONSULTATION: Anemia  # CHRONIC MILD ANEMIA hb ~11.[Previous Dr.Gittin pt]; [place] 2017- work up [employer]; stool card; Iron/ monoclonal work up- NEGATIVE]   # CHRONIC INTERSTITIAL CYSTITIS/ wheel chair bound   No history exists.     HISTORY OF PRESENTING ILLNESS: pt is a poor historian.  Kimberly Roy 82 y.o.  female  with a history of chronic anemia- is here to review the results of her blood work that was ordered for anemia.  Patient was recently treated with ciprofloxacin for possible UA; symptoms of dysuria. Not completely resolved. No fevers. Interested in getting a UA.   Patient denies any abdominal pain. Denies any dysuria. Denies any blood in urine. Denies any chest pain or shortness of the cochlea denies any difficulty swallowing. No constitutional diarrhea or blood in stools. Patient is chronic swelling in the legs. She is in a wheelchair.   ROS: A complete 10 point review of system is done which is negative except mentioned above in history of present illness  MEDICAL HISTORY:  Past Medical History:  Diagnosis Date  . Anemia   . Anxiety   . Chest pain   . CHF (congestive heart failure) ([employer])   . Constipation   . DDD (degenerative disc disease), cervical   . Depression   . DVT (deep venous thrombosis) ([place])   . Dysphonia   . Dyspnea   . Fatty liver   . Fatty liver   . Headache   . Hyperlipidemia   . Hyperpiesia   . Hypertension   . Hypothyroidism   . Interstitial cystitis   . Left ventricular dysfunction   . Lymphedema   . Nephrolithiasis   . Obstructive sleep apnea   . Osteoarthritis    knees/cervical and lumbar spine  . Pulmonary hypertension   . Pulmonary nodules    followed by Dr Raul Del   . Pure hypercholesterolemia   . Renal cyst    right    SURGICAL HISTORY: Past Surgical History:  Procedure Laterality Date  . ABDOMINAL HYSTERECTOMY     ovaries left in place  . APPENDECTOMY    . Back Surgeries    . BREAST REDUCTION SURGERY     3/99  . CARDIAC CATHETERIZATION    . cataracts Bilateral   . CERVICAL SPINE SURGERY    . ESOPHAGEAL MANOMETRY N/A 08/02/2015   Procedure: ESOPHAGEAL MANOMETRY (EM);  Surgeon: Josefine Class, MD;  Location: [facility] ENDOSCOPY;  Service: Endoscopy;  Laterality: N/A;  . ESOPHAGOGASTRODUODENOSCOPY N/A 02/27/2015   Procedure: ESOPHAGOGASTRODUODENOSCOPY (EGD);  Surgeon: Hulen Luster, MD;  Location: [facility] ENDOSCOPY;  Service: Gastroenterology;  Laterality: N/A;  . EXCISIONAL HEMORRHOIDECTOMY    . HEMORRHOID SURGERY    . HIP SURGERY  2013   Right hip surgery  . KNEE ARTHROSCOPY     left and right  . REDUCTION MAMMAPLASTY Bilateral   . REPLACEMENT TOTAL KNEE Bilateral   . rotator cuff surgery     blilateral  . TONSILECTOMY/ADENOIDECTOMY WITH MYRINGOTOMY      SOCIAL HISTORY: Social History   Social History  . Marital status: Married    Spouse name: N/A  . Number of children: 2  . Years of education: N/A   Occupational History  . Not on file.   Social History Main Topics  .  Smoking status: Never Smoker  . Smokeless tobacco: Never Used  . Alcohol use No  . Drug use: No  . Sexual activity: Not Currently   Other Topics Concern  . Not on file   Social History Narrative  . No narrative on file    FAMILY HISTORY: Family History  Problem Relation Age of Onset  . Heart disease Mother   . Stroke Mother   . Hypertension Mother   . Heart disease Father     myocardial infarction age 60    ALLERGIES:  is allergic to lyrica [pregabalin]; atarax [hydroxyzine]; dicyclomine; hydroxyzine hcl; levaquin [levofloxacin]; nucynta er [tapentadol hcl er]; oxybutynin; zoloft [sertraline hcl]; biaxin [clarithromycin]; sertraline; sulfa  antibiotics; tape; and tapentadol.  MEDICATIONS:  Current Outpatient Prescriptions  Medication Sig Dispense Refill  . aluminum-magnesium hydroxide-simethicone (MAALOX) [phone] MG/5ML SUSP Take by mouth.    Marland Kitchen amLODipine (NORVASC) 10 MG tablet TAKE ONE (1) TABLET EACH DAY 90 tablet 1  . atorvastatin (LIPITOR) 20 MG tablet TAKE ONE TABLET AT BEDTIME 90 tablet 3  . benazepril (LOTENSIN) 40 MG tablet TAKE ONE (1) TABLET EACH DAY 90 tablet 3  . Calcium-Magnesium-Vitamin D 600-40-500 MG-MG-UNIT TB24 Take 630 mg by mouth 4 (four) times daily.    [name] Sodium 30-100 MG CAPS Take by mouth.    . cephALEXin (KEFLEX) 250 MG capsule Take 250 mg by mouth daily.    . Cholecalciferol (VITAMIN D3) 1000 UNITS CAPS Take by mouth.    . ciprofloxacin (CIPRO) 500 MG tablet Take 1 tablet (500 mg total) by mouth 2 (two) times daily. 10 tablet 0  . DULoxetine (CYMBALTA) 60 MG capsule Take 60 mg by mouth daily.    . fluticasone (FLONASE) 50 MCG/ACT nasal spray Place 2 sprays into the nose daily.    . furosemide (LASIX) 20 MG tablet TAKE ONE (1) TABLET EACH DAY AS NEEDED 30 tablet 3  . gabapentin (NEURONTIN) 300 MG capsule Take 300 mg by mouth 3 (three) times daily.    . hydrochlorothiazide (HYDRODIURIL) 25 MG tablet TAKE ONE (1) TABLET EACH DAY 90 tablet 1  . HYDROcodone-acetaminophen (NORCO) 10-325 MG per tablet Take 1 tablet by mouth every 4 (four) hours as needed.    . Inulin (FIBER CHOICE PO) Take 2 tablets by mouth daily. Chew tablets    . magnesium oxide (MAG-OX) 400 MG tablet Take 1 tablet (400 mg total) by mouth daily. 30 tablet 1  . metoprolol succinate (TOPROL-XL) 100 MG 24 hr tablet TAKE ONE TABLET TWICE DAILY. TAKE WITH OR IMMEDIATELY FOLLOWING A MEAL 180 tablet 3  . morphine (MS CONTIN) 30 MG 12 hr tablet Take 30 mg by mouth daily. Takes around midday    . morphine (MS CONTIN) 60 MG 12 hr tablet Take 60 mg by mouth 2 (two) times daily.     . mupirocin ointment (BACTROBAN) 2 % Apply to  affected area on back bid 22 g 0  . nystatin cream (MYCOSTATIN) Apply topically 2 (two) times daily. 30 g 1  . pantoprazole (PROTONIX) 40 MG tablet TAKE ONE TABLET TWICE DAILY 60 tablet 11  . polyethylene glycol powder (GLYCOLAX/MIRALAX) powder MIX 17 GRAMS (AS MARKED IN BOTTLE TOP) IN 8 OUNCES OF WATER, MIX AND DRINK ONCEA DAY AS DIRECTED. 527 g 1  . Probiotic Product (ALIGN PO) Take by mouth daily.    . ranitidine (ZANTAC) 150 MG tablet Take 150 mg by mouth 2 (two) times daily.    . Simethicone (GAS-X PO) Take by mouth.    Marland Kitchen  sodium chloride (OCEAN) 0.65 % nasal spray Place 1 spray into the nose as needed.    Marland Kitchen SYNTHROID 112 MCG tablet TAKE ONE (1) TABLET EACH DAY 90 tablet 3   No current facility-administered medications for this visit.       Marland Kitchen  PHYSICAL EXAMINATION: ECOG PERFORMANCE STATUS: 0 - Asymptomatic  Vitals:   08/21/16 1603  BP: (!) 147/70  Pulse: 61  Resp: 18  Temp: 97.5 F (36.4 C)   Filed Weights   08/21/16 1603  Weight: 216 lb (98 kg)    GENERAL: Well-nourished well-developed; Alert, no distress and comfortable.  Obese. With family. A wheelchair. EYES: no pallor or icterus OROPHARYNX: no thrush or ulceration.   NECK: supple, no masses felt LYMPH:  no palpable lymphadenopathy in the cervical, axillary or inguinal regions LUNGS: clear to auscultation and  No wheeze or crackles HEART/CVS: regular rate & rhythm and no murmurs; No lower extremity edema ABDOMEN: abdomen soft, non-tender and normal bowel sounds Musculoskeletal:no cyanosis of digits and no clubbing  PSYCH: alert & oriented x 3 with fluent speech NEURO: no focal motor/sensory deficits SKIN:  no rashes or significant lesions  LABORATORY DATA:  I have reviewed the data as listed Lab Results  Component Value Date   WBC 7.9 08/07/2016   HGB 11.0 (L) 08/07/2016   HCT 32.3 (L) 08/07/2016   MCV 94.8 08/07/2016   PLT 172 08/07/2016    Recent Labs  01/20/16 2105 02/12/16 1659 05/13/16 1435  07/30/16 0955 08/07/16 1553  NA 135 137 137 141 138  K 3.7 4.0 3.7 4.7 3.6  CL 96* 98* 99 100 98*  CO2 31 31 34* 35* 32  GLUCOSE 123* 94 94 89 98  BUN 24* _0 21*  CREATININE 0.94 0.84 0.70 0.84 0.94  CALCIUM 10.0 9.5 9.4 10.0 9.3  GFRNONAA 55* >60  --   --  55*  GFRAA >60 >60  --   --  >60  PROT  --  6.6 7.0 6.9 7.2  ALBUMIN  --  3.8 4.0 4.2 4.0  AST  --  _1 ALT  --  _2 ALKPHOS  --  68 78 91 87  BILITOT  --  0.4 0.3 0.4 <0.1*  BILIDIR  --  <0.1* 0.1 0.1  --   IBILI  --  NOT CALCULATED  --   --   --     RADIOGRAPHIC STUDIES: I have personally reviewed the radiological images as listed and agreed with the findings in the report. No results found.  ASSESSMENT & PLAN:   Anemia due to blood loss, chronic Chronic anemia unclear etiology. Extensive workup negative for any obvious cause. Discussed regarding a bone marrow biopsy. However patient's hemoglobin is mildly low at 11 last the patient's is chronic. Possible MDS-. Next step for diagnosis would be bone marrow biopsy. Discussed with the patient detailed. However as patient is not significantly symptomatic recommend holding off bone marrow at this time. However at any point in time if patient's hemoglobin drops further/or symptomatic then recommend a bone marrow biopsy. She agrees.  # UA/ history of chronic interstitial cystitis. Continued dysuria [status post recent antibiotics]. Not improved recheck UA & culture.  # follow up in 6 months/labs.    Cammie Sickle, MD 08/21/2016 4:46 PM

## 2016-08-21 NOTE — Assessment & Plan Note (Signed)
Chronic anemia unclear etiology. Extensive workup negative for any obvious cause. Discussed regarding a bone marrow biopsy. However patient's hemoglobin is mildly low at 11 last the patient's is chronic. Possible MDS-. Next step for diagnosis would be bone marrow biopsy. Discussed with the patient detailed. However as patient is not significantly symptomatic recommend holding off bone marrow at this time. However at any point in time if patient's hemoglobin drops further/or symptomatic then recommend a bone marrow biopsy. She agrees.  # UA/ history of chronic interstitial cystitis. Continued dysuria [status post recent antibiotics]. Not improved recheck UA & culture.  # follow up in 6 months/labs.

## 2016-08-22 NOTE — Progress Notes (Signed)
PC to patient and advised her that her UA shows no sign of infection.

## 2016-08-23 LAB — URINE CULTURE

## 2016-08-28 DIAGNOSIS — G4733 Obstructive sleep apnea (adult) (pediatric): Secondary | ICD-10-CM | POA: Diagnosis not present

## 2016-08-30 DIAGNOSIS — J449 Chronic obstructive pulmonary disease, unspecified: Secondary | ICD-10-CM | POA: Diagnosis not present

## 2016-09-04 ENCOUNTER — Other Ambulatory Visit: Payer: Self-pay | Admitting: Internal Medicine

## 2016-09-04 NOTE — Telephone Encounter (Signed)
Please advise on refill. RX not refilled since 11/2014. Patient last OV 07/22/16 and you noted you wanted her to start taking on daily basis.

## 2016-09-05 NOTE — Telephone Encounter (Signed)
ok'd rx for hctz #90 with one refill.

## 2016-09-10 DIAGNOSIS — Z8719 Personal history of other diseases of the digestive system: Secondary | ICD-10-CM | POA: Diagnosis not present

## 2016-09-10 DIAGNOSIS — K625 Hemorrhage of anus and rectum: Secondary | ICD-10-CM | POA: Diagnosis not present

## 2016-09-10 DIAGNOSIS — K5909 Other constipation: Secondary | ICD-10-CM | POA: Diagnosis not present

## 2016-09-10 DIAGNOSIS — R1310 Dysphagia, unspecified: Secondary | ICD-10-CM | POA: Diagnosis not present

## 2016-09-11 ENCOUNTER — Telehealth: Payer: Self-pay | Admitting: Pulmonary Disease

## 2016-09-11 NOTE — Telephone Encounter (Signed)
Pt aware that [facility] will need to address this, as we had placed an order to renew cpap supplies 08-15-16. I have provided pt with [facility]'s number. Pt aware and voiced her understanding.  Nothing further needed.

## 2016-09-13 ENCOUNTER — Ambulatory Visit: Payer: PPO

## 2016-09-13 ENCOUNTER — Encounter: Payer: Self-pay | Admitting: Emergency Medicine

## 2016-09-13 ENCOUNTER — Telehealth: Payer: Self-pay | Admitting: *Deleted

## 2016-09-13 ENCOUNTER — Emergency Department: Payer: PPO

## 2016-09-13 ENCOUNTER — Emergency Department
Admission: EM | Admit: 2016-09-13 | Discharge: 2016-09-13 | Disposition: A | Discharge status: Home / Self Care | Payer: PPO | Attending: Emergency Medicine | Admitting: Emergency Medicine

## 2016-09-13 DIAGNOSIS — H6991 Unspecified Eustachian tube disorder, right ear: Secondary | ICD-10-CM | POA: Diagnosis not present

## 2016-09-13 DIAGNOSIS — I509 Heart failure, unspecified: Secondary | ICD-10-CM | POA: Insufficient documentation

## 2016-09-13 DIAGNOSIS — J069 Acute upper respiratory infection, unspecified: Secondary | ICD-10-CM | POA: Diagnosis not present

## 2016-09-13 DIAGNOSIS — H6981 Other specified disorders of Eustachian tube, right ear: Secondary | ICD-10-CM

## 2016-09-13 DIAGNOSIS — E039 Hypothyroidism, unspecified: Secondary | ICD-10-CM | POA: Insufficient documentation

## 2016-09-13 DIAGNOSIS — R509 Fever, unspecified: Secondary | ICD-10-CM | POA: Diagnosis not present

## 2016-09-13 DIAGNOSIS — Z79899 Other long term (current) drug therapy: Secondary | ICD-10-CM | POA: Insufficient documentation

## 2016-09-13 DIAGNOSIS — I110 Hypertensive heart disease with heart failure: Secondary | ICD-10-CM | POA: Insufficient documentation

## 2016-09-13 DIAGNOSIS — R05 Cough: Secondary | ICD-10-CM | POA: Diagnosis not present

## 2016-09-13 MED ORDER — FLUTICASONE FUROATE 27.5 MCG/SPRAY NA SUSP: 1.0000 | Freq: Every day | NASAL | 0 refills | Status: DC

## 2016-09-13 MED ORDER — AMOXICILLIN 500 MG PO TABS: 500.0000 mg | ORAL_TABLET | Freq: Three times a day (TID) | ORAL | 0 refills | Status: DC

## 2016-09-13 NOTE — Telephone Encounter (Signed)
Patient afebrile,  Sounds very congested , coughing, started with clear mucus running down back of throat on Tuesday and today coughing up thick yellow mucus. Patient feels pressure in chest. Patient verbalized with husband she will go to ER.

## 2016-09-13 NOTE — ED Triage Notes (Addendum)
Pt to ed with c/o fever, yellow mucus, runny nose, cough, congestion since Tuesday.  Pt able to speak in full complete sentences without resp distress.  Pt alert and oriented and skin warm and dry.

## 2016-09-13 NOTE — ED Provider Notes (Signed)
[facility] Emergency Department Provider Note  ____________________________________________  Time seen: Approximately 1:32 PM  I have reviewed the triage vital signs and the nursing notes.   HISTORY  Chief Complaint Cough; Nasal Congestion; and Fever   HPI Kimberly Roy is a 83 y.o. female who presents to the emergency department for evaluation of fever, congestion, rhinorrhea, and cough since Tuesday. Denies dyspnea.    Past Medical History:  Diagnosis Date  . Anemia   . Anxiety   . Chest pain   . CHF (congestive heart failure) ([place])   . Constipation   . DDD (degenerative disc disease), cervical   . Depression   . DVT (deep venous thrombosis) ([place])   . Dysphonia   . Dyspnea   . Fatty liver   . Fatty liver   . Headache   . Hyperlipidemia   . Hyperpiesia   . Hypertension   . Hypothyroidism   . Interstitial cystitis   . Left ventricular dysfunction   . Lymphedema   . Nephrolithiasis   . Obstructive sleep apnea   . Osteoarthritis    knees/cervical and lumbar spine  . Pulmonary hypertension   . Pulmonary nodules    followed by Dr Raul Del  . Pure hypercholesterolemia   . Renal cyst    right    Patient Active Problem List   Diagnosis Date Noted  . Anemia due to blood loss, chronic 08/07/2016  . GERD (gastroesophageal reflux disease) 12/10/2015  . Finger pain 11/13/2015  . Carotid artery calcification 09/26/2015  . External nasal lesion 09/25/2015  . Muscle cramps 09/01/2015  . Excessive sweating 07/30/2015  . Cellulitis of leg, right 07/16/2015  . Headache 07/16/2015  . Groin pain 05/14/2015  . Muscle twitching 03/05/2015  . Leg cramps 02/11/2015  . Back pain 02/11/2015  . Abdominal pain 02/11/2015  . Acute cystitis without hematuria 12/12/2014  . Left elbow pain 11/30/2014  . Health care maintenance 11/30/2014  . Osteoporosis 10/19/2014  . Rectal bleeding 10/19/2014  . Obesity (BMI 30-39.9) 09/03/2014  . Neck pain 09/03/2014   . Unsteady gait 09/03/2014  . Nocturia 09/03/2014  . Dysphagia 06/01/2014  . Nasal dryness 06/01/2014  . Stress 06/01/2014  . Obesity 02/27/2014  . Fatigue 02/27/2014  . Pre-op evaluation 10/12/2013  . Hoarseness 08/14/2013  . Leg swelling 01/24/2013  . CHF (congestive heart failure) ([place]) 12/29/2012  . Cough 12/29/2012  . Chest pain 12/29/2012  . OSA (obstructive sleep apnea) 07/09/2012  . Osteoarthritis 07/04/2012  . Anemia 07/04/2012  . Chronic constipation 07/04/2012  . Pulmonary hypertension ([name]) 07/04/2012  . Pulmonary nodules 07/04/2012  . Hypertension 07/04/2012  . Hypercholesterolemia 07/04/2012  . Hypothyroidism 07/04/2012    Past Surgical History:  Procedure Laterality Date  . ABDOMINAL HYSTERECTOMY     ovaries left in place  . APPENDECTOMY    . Back Surgeries    . BREAST REDUCTION SURGERY     3/99  . CARDIAC CATHETERIZATION    . cataracts Bilateral   . CERVICAL SPINE SURGERY    . ESOPHAGEAL MANOMETRY N/A 08/02/2015   Procedure: ESOPHAGEAL MANOMETRY (EM);  Surgeon: Josefine Class, MD;  Location: [facility] ENDOSCOPY;  Service: Endoscopy;  Laterality: N/A;  . ESOPHAGOGASTRODUODENOSCOPY N/A 02/27/2015   Procedure: ESOPHAGOGASTRODUODENOSCOPY (EGD);  Surgeon: Hulen Luster, MD;  Location: [facility] ENDOSCOPY;  Service: Gastroenterology;  Laterality: N/A;  . EXCISIONAL HEMORRHOIDECTOMY    . HEMORRHOID SURGERY    . HIP SURGERY  2013   Right hip surgery  . KNEE  ARTHROSCOPY     left and right  . REDUCTION MAMMAPLASTY Bilateral   . REPLACEMENT TOTAL KNEE Bilateral   . rotator cuff surgery     blilateral  . TONSILECTOMY/ADENOIDECTOMY WITH MYRINGOTOMY      Prior to Admission medications   Medication Sig Start Date End Date Taking? Authorizing Provider  aluminum-magnesium hydroxide-simethicone (MAALOX) 170-017-49 MG/5ML SUSP Take by mouth.    Historical Provider, MD  amLODipine (NORVASC) 10 MG tablet TAKE ONE (1) TABLET EACH DAY 04/08/16   Einar Pheasant, MD  amoxicillin  (AMOXIL) 500 MG tablet Take 1 tablet (500 mg total) by mouth 3 (three) times daily. 09/13/16   Victorino Dike, FNP  atorvastatin (LIPITOR) 20 MG tablet TAKE ONE TABLET AT BEDTIME 01/08/16   Einar Pheasant, MD  benazepril (LOTENSIN) 40 MG tablet TAKE ONE (1) TABLET EACH DAY 01/08/16   Einar Pheasant, MD  Calcium-Magnesium-Vitamin D 600-40-500 MG-MG-UNIT TB24 Take 630 mg by mouth 4 (four) times daily.    Historical Provider, MD  Casanthranol-Docusate Sodium 30-100 MG CAPS Take by mouth.    Historical Provider, MD  cephALEXin (KEFLEX) 250 MG capsule Take 250 mg by mouth daily.    Historical Provider, MD  Cholecalciferol (VITAMIN D3) 1000 UNITS CAPS Take by mouth.    Historical Provider, MD  ciprofloxacin (CIPRO) 500 MG tablet Take 1 tablet (500 mg total) by mouth 2 (two) times daily. 08/08/16   Cammie Sickle, MD  DULoxetine (CYMBALTA) 60 MG capsule Take 60 mg by mouth daily.    Historical Provider, MD  fluticasone (FLONASE) 50 MCG/ACT nasal spray Place 2 sprays into the nose daily.    Historical Provider, MD  fluticasone (VERAMYST) 27.5 MCG/SPRAY nasal spray Place 1 spray into the nose daily. 09/13/16 09/20/16  Victorino Dike, FNP  furosemide (LASIX) 20 MG tablet TAKE ONE (1) TABLET EACH DAY AS NEEDED 06/05/15   Einar Pheasant, MD  gabapentin (NEURONTIN) 300 MG capsule Take 300 mg by mouth 3 (three) times daily.    Historical Provider, MD  hydrochlorothiazide (HYDRODIURIL) 25 MG tablet TAKE ONE (1) TABLET EACH DAY 09/05/16   Einar Pheasant, MD  HYDROcodone-acetaminophen (NORCO) 10-325 MG per tablet Take 1 tablet by mouth every 4 (four) hours as needed.    Historical Provider, MD  Inulin (FIBER CHOICE PO) Take 2 tablets by mouth daily. Chew tablets    Historical Provider, MD  magnesium oxide (MAG-OX) 400 MG tablet Take 1 tablet (400 mg total) by mouth daily. 02/11/15   Einar Pheasant, MD  metoprolol succinate (TOPROL-XL) 100 MG 24 hr tablet TAKE ONE TABLET TWICE DAILY. TAKE WITH OR IMMEDIATELY FOLLOWING A  MEAL 12/05/15   Einar Pheasant, MD  morphine (MS CONTIN) 30 MG 12 hr tablet Take 30 mg by mouth daily. Takes around midday    Historical Provider, MD  morphine (MS CONTIN) 60 MG 12 hr tablet Take 60 mg by mouth 2 (two) times daily.     Historical Provider, MD  mupirocin ointment (BACTROBAN) 2 % Apply to affected area on back bid 05/13/16   Einar Pheasant, MD  nystatin cream (MYCOSTATIN) Apply topically 2 (two) times daily. 07/08/16   Einar Pheasant, MD  pantoprazole (PROTONIX) 40 MG tablet TAKE ONE TABLET TWICE DAILY 12/15/15   Einar Pheasant, MD  polyethylene glycol powder (GLYCOLAX/MIRALAX) powder MIX 17 GRAMS (AS MARKED IN BOTTLE TOP) IN 8 OUNCES OF WATER, MIX AND DRINK ONCEA DAY AS DIRECTED. 04/10/16   Einar Pheasant, MD  Probiotic Product (ALIGN PO) Take by mouth daily.  Historical Provider, MD  ranitidine (ZANTAC) 150 MG tablet Take 150 mg by mouth 2 (two) times daily.    Historical Provider, MD  Simethicone (GAS-X PO) Take by mouth.    Historical Provider, MD  sodium chloride (OCEAN) 0.65 % nasal spray Place 1 spray into the nose as needed.    Historical Provider, MD  SYNTHROID 112 MCG tablet TAKE ONE (1) TABLET EACH DAY 01/08/16   Einar Pheasant, MD    Allergies Lyrica [pregabalin]; Atarax [hydroxyzine]; Dicyclomine; Hydroxyzine hcl; Levaquin [levofloxacin]; Nucynta er [tapentadol hcl er]; Oxybutynin; Zoloft [sertraline hcl]; Biaxin [clarithromycin]; Sertraline; Sulfa antibiotics; Tape; and Tapentadol  Family History  Problem Relation Age of Onset  . Heart disease Mother   . Stroke Mother   . Hypertension Mother   . Heart disease Father     myocardial infarction age 73    Social History Social History  Substance Use Topics  . Smoking status: Never Smoker  . Smokeless tobacco: Never Used  . Alcohol use No    Review of Systems Constitutional: Positive for fever/chills ENT: Negative for sore throat. Decreased hearing in the right. Cardiovascular: Denies chest pain. Respiratory:  Negative for shortness of breath. Positive for cough. Gastrointestinal: Negative for nausea,  Negative for vomiting.  Negative for diarrhea.  Musculoskeletal: Negative for body aches Skin: Negative for rash. Neurological: Negative for headaches ____________________________________________   PHYSICAL EXAM:  VITAL SIGNS: ED Triage Vitals [09/13/16 1234]  Enc Vitals Group     BP (!) 150/57     Pulse Rate 65     Resp (!) 22     Temp 99.5 F (37.5 C)     Temp Source Oral     SpO2 95 %     Weight 216 lb (98 kg)     Height      Head Circumference      Peak Flow      Pain Score 7     Pain Loc      Pain Edu?      Excl. in [place]?     Constitutional: Alert and oriented. Well appearing and in no acute distress. Eyes: Conjunctivae are normal. EOMI. Ears: Bilateral TM normal Nose: Maxillary sinus congestion; clear rhinnorhea. Mouth/Throat: Mucous membranes are moist.  Oropharynx mildly erythematous. Tonsils appear normal. Neck: No stridor.  Lymphatic: No cervical lymphadenopathy. Cardiovascular: Normal rate, regular rhythm. Grossly normal heart sounds.  Good peripheral circulation. Respiratory: Normal respiratory effort.  No retractions. Breath sounds clear to auscultation. Gastrointestinal: Soft and nontender.  Musculoskeletal: FROM x 4 extremities.  Neurologic:  Normal speech and language.  Skin:  Skin is warm, dry and intact. No rash noted. Psychiatric: Mood and affect are normal. Speech and behavior are normal.  ____________________________________________   LABS (all labs ordered are listed, but only abnormal results are displayed)  Labs Reviewed - No data to display ____________________________________________  EKG  Normal sinus rhythm with a ventricular rate of 71. No ectopy. ____________________________________________  RADIOLOGY  No acute disease process identified on chest x-ray per  radiology. ____________________________________________   PROCEDURES  Procedure(s) performed: None  Critical Care performed: No  ____________________________________________   INITIAL IMPRESSION / ASSESSMENT AND PLAN / ED COURSE     Pertinent labs & imaging results that were available during my care of the patient were reviewed by me and considered in my medical decision making (see chart for details).  83 year old female presenting to the emergency department for evaluation of cough and congestion. She was prescriptions for amoxicillin and fluticasone nasal  spray. She is to follow-up with her primary care provider for symptoms that are not improving over the next 2 days. She is to return to the emergency department for symptoms that change or worsen if she is unable schedule an appointment. ____________________________________________   FINAL CLINICAL IMPRESSION(S) / ED DIAGNOSES  Final diagnoses:  Dysfunction of right eustachian tube  Acute upper respiratory infection    Note:  This document was prepared using Dragon voice recognition software and may include unintentional dictation errors.     Victorino Dike, FNP 09/19/16 1696    Daymon Larsen, MD 09/27/16 [phone]

## 2016-09-16 ENCOUNTER — Telehealth: Payer: Self-pay | Admitting: *Deleted

## 2016-09-16 NOTE — Telephone Encounter (Signed)
Is there somewhere you would like to fit her in. I do not see anything.

## 2016-09-16 NOTE — Telephone Encounter (Signed)
Please give a time and date to place pt for a ER follow up. Pt was seen at [facility] on  01/12

## 2016-09-17 NOTE — Telephone Encounter (Signed)
Per documentation, she was seen for congestion and fever.  I am unable to pull up ER note for some reason.  In reviewing meds, she was given abx.  Need to know how she is doing.  Have symptoms started to improve?  If just needs routine follow up and doing better, will need to send to [facility] for work in appt.  Thanks  Let me know if any acute issues.

## 2016-09-17 NOTE — Telephone Encounter (Signed)
Scheduled patient to come in 09/19/16 @ 3 PM to see Dr. Nicki Reaper. Patient stated that she is on an ABX and has started to feel better.

## 2016-09-19 ENCOUNTER — Ambulatory Visit: Payer: PPO | Admitting: Internal Medicine

## 2016-09-30 DIAGNOSIS — J449 Chronic obstructive pulmonary disease, unspecified: Secondary | ICD-10-CM | POA: Diagnosis not present

## 2016-10-10 ENCOUNTER — Other Ambulatory Visit: Payer: Self-pay | Admitting: Internal Medicine

## 2016-10-11 ENCOUNTER — Ambulatory Visit: Payer: PPO

## 2016-10-15 DIAGNOSIS — G253 Myoclonus: Secondary | ICD-10-CM | POA: Diagnosis not present

## 2016-10-15 DIAGNOSIS — G608 Other hereditary and idiopathic neuropathies: Secondary | ICD-10-CM | POA: Diagnosis not present

## 2016-10-25 ENCOUNTER — Encounter: Payer: PPO | Admitting: Internal Medicine

## 2016-10-29 ENCOUNTER — Ambulatory Visit (INDEPENDENT_AMBULATORY_CARE_PROVIDER_SITE_OTHER): Payer: PPO | Admitting: Internal Medicine

## 2016-10-29 ENCOUNTER — Ambulatory Visit (INDEPENDENT_AMBULATORY_CARE_PROVIDER_SITE_OTHER): Payer: PPO

## 2016-10-29 ENCOUNTER — Encounter: Payer: Self-pay | Admitting: Internal Medicine

## 2016-10-29 VITALS — BP 140/68 | HR 74 | Temp 98.6°F | Resp 18 | Ht 62.0 in | Wt 209.0 lb

## 2016-10-29 DIAGNOSIS — M810 Age-related osteoporosis without current pathological fracture: Secondary | ICD-10-CM

## 2016-10-29 DIAGNOSIS — E7800 Pure hypercholesterolemia, unspecified: Secondary | ICD-10-CM

## 2016-10-29 DIAGNOSIS — G4733 Obstructive sleep apnea (adult) (pediatric): Secondary | ICD-10-CM | POA: Diagnosis not present

## 2016-10-29 DIAGNOSIS — D649 Anemia, unspecified: Secondary | ICD-10-CM

## 2016-10-29 DIAGNOSIS — I10 Essential (primary) hypertension: Secondary | ICD-10-CM | POA: Diagnosis not present

## 2016-10-29 DIAGNOSIS — M7989 Other specified soft tissue disorders: Secondary | ICD-10-CM | POA: Diagnosis not present

## 2016-10-29 DIAGNOSIS — K219 Gastro-esophageal reflux disease without esophagitis: Secondary | ICD-10-CM | POA: Diagnosis not present

## 2016-10-29 DIAGNOSIS — E669 Obesity, unspecified: Secondary | ICD-10-CM | POA: Diagnosis not present

## 2016-10-29 DIAGNOSIS — E039 Hypothyroidism, unspecified: Secondary | ICD-10-CM

## 2016-10-29 DIAGNOSIS — M25511 Pain in right shoulder: Secondary | ICD-10-CM

## 2016-10-29 DIAGNOSIS — F439 Reaction to severe stress, unspecified: Secondary | ICD-10-CM | POA: Diagnosis not present

## 2016-10-29 DIAGNOSIS — K5909 Other constipation: Secondary | ICD-10-CM

## 2016-10-29 DIAGNOSIS — I272 Pulmonary hypertension, unspecified: Secondary | ICD-10-CM

## 2016-10-29 DIAGNOSIS — M1990 Unspecified osteoarthritis, unspecified site: Secondary | ICD-10-CM | POA: Diagnosis not present

## 2016-10-29 DIAGNOSIS — G8929 Other chronic pain: Secondary | ICD-10-CM

## 2016-10-29 DIAGNOSIS — M19011 Primary osteoarthritis, right shoulder: Secondary | ICD-10-CM | POA: Diagnosis not present

## 2016-10-29 DIAGNOSIS — M5440 Lumbago with sciatica, unspecified side: Secondary | ICD-10-CM

## 2016-10-29 NOTE — Progress Notes (Signed)
Pre-visit discussion using our clinic review tool. No additional management support is needed unless otherwise documented below in the visit note.

## 2016-10-29 NOTE — Progress Notes (Signed)
Patient ID: Kimberly Roy, female   DOB: [birth year], 83 y.o.   MRN: [id number]   Subjective:    Patient ID: Kimberly Roy, female    DOB: February 12, 1931, 83 y.o.   MRN: [id number]  HPI  Patient here for scheduled follow up.  Was scheduled for a physical, but decided to postpone.  Had multiple concerns today.  She is accompanied by her husband.  History obtained from both of them.  She is still having some swelling of her lower extremities.  Discussed leg elevation.  Discussed decreased salt intake and wearing compression hose.  Planning to get new hose this pm.  No chest pain.  Breathing overall stable.  She does report some increased pain - right arm/shoulder.  Concern regarding possible rotator cuff injury.  Persistent pain.  No known injury or trauma.  Blood pressures on outside checks averaging 130-140s/50-60s.  Seeing Dr Manuella Ghazi.  On gabapentin.  titrating the dose.  Planning for NCS 11/18/16.  Was placed on amitiza for constipation.     Past Medical History:  Diagnosis Date  . Anemia   . Anxiety   . Chest pain   . CHF (congestive heart failure) ([place])   . Constipation   . DDD (degenerative disc disease), cervical   . Depression   . DVT (deep venous thrombosis) ([place])   . Dysphonia   . Dyspnea   . Fatty liver   . Fatty liver   . Headache   . Hyperlipidemia   . Hyperpiesia   . Hypertension   . Hypothyroidism   . Interstitial cystitis   . Left ventricular dysfunction   . Lymphedema   . Nephrolithiasis   . Obstructive sleep apnea   . Osteoarthritis    knees/cervical and lumbar spine  . Pulmonary hypertension   . Pulmonary nodules    followed by Dr Raul Del  . Pure hypercholesterolemia   . Renal cyst    right   Past Surgical History:  Procedure Laterality Date  . ABDOMINAL HYSTERECTOMY     ovaries left in place  . APPENDECTOMY    . Back Surgeries    . BREAST REDUCTION SURGERY     3/99  . CARDIAC CATHETERIZATION    . cataracts Bilateral   . CERVICAL SPINE SURGERY    . ESOPHAGEAL  MANOMETRY N/A 08/02/2015   Procedure: ESOPHAGEAL MANOMETRY (EM);  Surgeon: Josefine Class, MD;  Location: [facility] ENDOSCOPY;  Service: Endoscopy;  Laterality: N/A;  . ESOPHAGOGASTRODUODENOSCOPY N/A 02/27/2015   Procedure: ESOPHAGOGASTRODUODENOSCOPY (EGD);  Surgeon: Hulen Luster, MD;  Location: [facility] ENDOSCOPY;  Service: Gastroenterology;  Laterality: N/A;  . EXCISIONAL HEMORRHOIDECTOMY    . HEMORRHOID SURGERY    . HIP SURGERY  2013   Right hip surgery  . KNEE ARTHROSCOPY     left and right  . REDUCTION MAMMAPLASTY Bilateral YRS AGO  . REPLACEMENT TOTAL KNEE Bilateral   . rotator cuff surgery     blilateral  . TONSILECTOMY/ADENOIDECTOMY WITH MYRINGOTOMY     Family History  Problem Relation Age of Onset  . Heart disease Mother   . Stroke Mother   . Hypertension Mother   . Heart disease Father     myocardial infarction age 73  . Breast cancer Neg Hx    Social History   Social History  . Marital status: Married    Spouse name: N/A  . Number of children: 2  . Years of education: N/A   Social History Main Topics  . Smoking status: Never Smoker  .  Smokeless tobacco: Never Used  . Alcohol use No  . Drug use: No  . Sexual activity: Not Currently   Other Topics Concern  . None   Social History Narrative  . None    Outpatient Encounter Prescriptions as of 10/29/2016  Medication Sig  . aluminum-magnesium hydroxide-simethicone (MAALOX) 161-096-04 MG/5ML SUSP Take by mouth.  Marland Kitchen amLODipine (NORVASC) 10 MG tablet TAKE 1 TABLET BY MOUTH EVERY DAY.  Marland Kitchen amoxicillin (AMOXIL) 500 MG tablet Take 1 tablet (500 mg total) by mouth 3 (three) times daily.  Marland Kitchen atorvastatin (LIPITOR) 20 MG tablet TAKE ONE TABLET AT BEDTIME  . benazepril (LOTENSIN) 40 MG tablet TAKE ONE (1) TABLET EACH DAY  . Calcium-Magnesium-Vitamin D 600-40-500 MG-MG-UNIT TB24 Take 630 mg by mouth 4 (four) times daily.  [name] Sodium 30-100 MG CAPS Take by mouth.  . cephALEXin (KEFLEX) 250 MG capsule Take 250  mg by mouth daily.  . Cholecalciferol (VITAMIN D3) 1000 UNITS CAPS Take by mouth.  . ciprofloxacin (CIPRO) 500 MG tablet Take 1 tablet (500 mg total) by mouth 2 (two) times daily.  . DULoxetine (CYMBALTA) 60 MG capsule Take 60 mg by mouth daily.  . fluticasone (FLONASE) 50 MCG/ACT nasal spray Place 2 sprays into the nose daily.  . furosemide (LASIX) 20 MG tablet TAKE ONE (1) TABLET EACH DAY AS NEEDED  . gabapentin (NEURONTIN) 300 MG capsule Take 300 mg by mouth 3 (three) times daily.   . hydrochlorothiazide (HYDRODIURIL) 25 MG tablet TAKE ONE (1) TABLET EACH DAY  . HYDROcodone-acetaminophen (NORCO) 10-325 MG per tablet Take 1 tablet by mouth every 4 (four) hours as needed.  . Inulin (FIBER CHOICE PO) Take 2 tablets by mouth daily. Chew tablets  . magnesium oxide (MAG-OX) 400 MG tablet Take 1 tablet (400 mg total) by mouth daily.  Marland Kitchen morphine (MS CONTIN) 30 MG 12 hr tablet Take 30 mg by mouth daily. Takes around midday  . morphine (MS CONTIN) 60 MG 12 hr tablet Take 60 mg by mouth 2 (two) times daily.   . mupirocin ointment (BACTROBAN) 2 % Apply to affected area on back bid  . nystatin cream (MYCOSTATIN) Apply topically 2 (two) times daily.  . pantoprazole (PROTONIX) 40 MG tablet TAKE ONE TABLET TWICE DAILY  . polyethylene glycol powder (GLYCOLAX/MIRALAX) powder MIX 17 GRAMS (AS MARKED IN BOTTLE TOP) IN 8 OUNCES OF WATER, MIX AND DRINK ONCEA DAY AS DIRECTED.  . Probiotic Product (ALIGN PO) Take by mouth daily.  . ranitidine (ZANTAC) 150 MG tablet Take 150 mg by mouth 2 (two) times daily.  . Simethicone (GAS-X PO) Take by mouth.  . sodium chloride (OCEAN) 0.65 % nasal spray Place 1 spray into the nose as needed.  Marland Kitchen SYNTHROID 112 MCG tablet TAKE ONE (1) TABLET EACH DAY  . [DISCONTINUED] metoprolol succinate (TOPROL-XL) 100 MG 24 hr tablet TAKE ONE TABLET TWICE DAILY. TAKE WITH OR IMMEDIATELY FOLLOWING A MEAL  . fluticasone (VERAMYST) 27.5 MCG/SPRAY nasal spray Place 1 spray into the nose daily.    No facility-administered encounter medications on file as of 10/29/2016.     Review of Systems  Constitutional: Negative for appetite change and unexpected weight change.  HENT: Negative for congestion and sinus pressure.   Respiratory: Negative for cough, chest tightness and shortness of breath.   Cardiovascular: Positive for leg swelling. Negative for chest pain and palpitations.  Gastrointestinal: Positive for constipation. Negative for abdominal pain, diarrhea, nausea and vomiting.  Genitourinary: Negative for difficulty urinating and dysuria.  Musculoskeletal: Positive  for back pain. Negative for joint swelling.  Skin: Negative for color change and rash.  Neurological: Negative for dizziness, light-headedness and headaches.  Psychiatric/Behavioral: Negative for agitation and dysphoric mood.       Objective:     Blood pressure rechecked by me:  140/68  Physical Exam  Constitutional: She appears well-developed and well-nourished. No distress.  HENT:  Nose: Nose normal.  Mouth/Throat: Oropharynx is clear and moist.  Neck: Neck supple. No thyromegaly present.  Cardiovascular: Normal rate and regular rhythm.   Pulmonary/Chest: Breath sounds normal. No respiratory distress. She has no wheezes.  Abdominal: Soft. Bowel sounds are normal. There is no tenderness.  Musculoskeletal: She exhibits no tenderness.  Some pedal and lower extremity swelling.    Lymphadenopathy:    She has no cervical adenopathy.  Skin: No rash noted. No erythema.  Psychiatric: She has a normal mood and affect. Her behavior is normal.    BP 140/68   Pulse 74   Temp 98.6 F (37 C) (Oral)   Resp 18   Ht _0  (1.575 m)   Wt 209 lb (94.8 kg)   SpO2 94%   BMI 38.23 kg/m  Wt Readings from Last 3 Encounters:  10/29/16 209 lb (94.8 kg)  09/13/16 216 lb (98 kg)  08/21/16 216 lb (98 kg)     Lab Results  Component Value Date   WBC 7.9 08/07/2016   HGB 11.0 (L) 08/07/2016   HCT 32.3 (L) 08/07/2016    PLT 172 08/07/2016   GLUCOSE 98 08/07/2016   CHOL 147 07/30/2016   TRIG 120.0 07/30/2016   HDL 45.00 07/30/2016   LDLCALC 78 07/30/2016   ALT 14 08/07/2016   AST 26 08/07/2016   NA 138 08/07/2016   K 3.6 08/07/2016   CL 98 (L) 08/07/2016   CREATININE 0.94 08/07/2016   BUN 21 (H) 08/07/2016   CO2 32 08/07/2016   TSH 1.216 02/12/2016   INR 1.0 12/08/2013   HGBA1C 6.0 10/19/2014    Dg Chest 2 View  Result Date: 09/13/2016 CLINICAL DATA:  Non productive cough X 5 days, chronic SOB but no known lung disease. Non smoker Per chart:Pt to ed with c/o fever, yellow mucus, runny nose, cough, congestion since Tuesday. EXAM: CHEST  2 VIEW COMPARISON:  08/21/2015 FINDINGS: Mild hyperinflation. Accentuation of expected thoracic kyphosis with mild osteopenia and lower thoracic spondylosis. Bilateral glenohumeral joint osteoarthritis with high-riding humeral heads, suggesting chronic rotator cuff insufficiency. Dorsal spinal stimulator. Midline trachea. Moderate cardiomegaly. Atherosclerosis in the transverse aorta. No pleural effusion or pneumothorax. No congestive failure. Suspect remote right rib trauma. Clear lungs. IMPRESSION: Cardiomegaly and hyperinflation, without acute disease. Electronically Signed   By: Abigail Miyamoto M.D.   On: 09/13/2016 14:25       Assessment & Plan:   Problem List Items Addressed This Visit    Anemia    Seeing hematology.        Back pain    Chronic.  Followed by [facility].        Chronic constipation    Seeing GI.  Was placed on amitiza.        GERD (gastroesophageal reflux disease)    Controlled on protonix.  Follow.        Hypercholesterolemia    On lipitor.  Low cholesterol diet and exercise.  Follow lipid panel and liver function tests.        Relevant Orders   Hepatic function panel   Lipid panel   Hypertension  Blood pressure on recheck improved.  Her outside checks as outlined.  Follow pressures.  Follow metabolic panel.       Relevant Orders    Basic metabolic panel   Hypothyroidism    On thyroid replacement.  Follow tsh.        Relevant Orders   TSH   Leg swelling    Leg elevation.  Compression hose as outlined.  Follow.       Obesity (BMI 30-39.9)    Diet and exercise.  Follow.       OSA (obstructive sleep apnea)    CPAP.       Osteoarthritis    F/u with Dr Marry Guan and pain clinic.        Osteoporosis    Has known osteoporosis.  Can schedule f/u bone density when ready.        Pulmonary hypertension ([place])    Has seen pulmonary.  Stable.       Stress    Followed by Dr Nicolasa Ducking.  Handling things relatively well.  Follow.        Other Visit Diagnoses    Right shoulder pain, unspecified chronicity    -  Primary   persistent pain.  check xray.     Relevant Orders   DG Shoulder Right (Completed)       Einar Pheasant, MD

## 2016-10-30 ENCOUNTER — Encounter: Payer: Self-pay | Admitting: *Deleted

## 2016-10-30 DIAGNOSIS — J449 Chronic obstructive pulmonary disease, unspecified: Secondary | ICD-10-CM | POA: Diagnosis not present

## 2016-10-31 NOTE — Telephone Encounter (Signed)
Pt will make app herself. If she needs any records or their office needs anything from [place] she will give [place] a call. States she has another issue with her finger so she wanted to make appointment for herself.

## 2016-11-07 ENCOUNTER — Other Ambulatory Visit: Payer: Self-pay | Admitting: Internal Medicine

## 2016-11-08 ENCOUNTER — Encounter: Payer: Self-pay | Admitting: Internal Medicine

## 2016-11-08 ENCOUNTER — Ambulatory Visit
Admission: RE | Admit: 2016-11-08 | Discharge: 2016-11-08 | Disposition: A | Discharge status: Home / Self Care | Payer: PPO | Source: Ambulatory Visit | Attending: Internal Medicine | Admitting: Internal Medicine

## 2016-11-08 DIAGNOSIS — Z1231 Encounter for screening mammogram for malignant neoplasm of breast: Secondary | ICD-10-CM | POA: Diagnosis not present

## 2016-11-10 NOTE — Assessment & Plan Note (Signed)
CPAP.  

## 2016-11-10 NOTE — Assessment & Plan Note (Signed)
On thyroid replacement.  Follow tsh.

## 2016-11-10 NOTE — Assessment & Plan Note (Signed)
On lipitor.  Low cholesterol diet and exercise.  Follow lipid panel and liver function tests.   

## 2016-11-10 NOTE — Assessment & Plan Note (Signed)
Followed by Dr Nicolasa Ducking.  Handling things relatively well.  Follow.

## 2016-11-10 NOTE — Assessment & Plan Note (Signed)
Blood pressure on recheck improved.  Her outside checks as outlined.  Follow pressures.  Follow metabolic panel.

## 2016-11-10 NOTE — Assessment & Plan Note (Signed)
Has known osteoporosis.  Can schedule f/u bone density when ready.

## 2016-11-10 NOTE — Assessment & Plan Note (Signed)
Has seen pulmonary.  Stable.

## 2016-11-10 NOTE — Assessment & Plan Note (Signed)
Controlled on protonix.  Follow.

## 2016-11-10 NOTE — Assessment & Plan Note (Signed)
Seeing hematology.

## 2016-11-10 NOTE — Assessment & Plan Note (Signed)
Chronic.  Followed by [facility].

## 2016-11-10 NOTE — Assessment & Plan Note (Signed)
F/u with Dr Marry Guan and pain clinic.

## 2016-11-10 NOTE — Assessment & Plan Note (Signed)
Seeing GI.  Was placed on amitiza.

## 2016-11-10 NOTE — Assessment & Plan Note (Signed)
Diet and exercise.  Follow.

## 2016-11-10 NOTE — Assessment & Plan Note (Signed)
Leg elevation.  Compression hose as outlined.  Follow.

## 2016-11-13 DIAGNOSIS — K219 Gastro-esophageal reflux disease without esophagitis: Secondary | ICD-10-CM | POA: Diagnosis not present

## 2016-11-13 DIAGNOSIS — R140 Abdominal distension (gaseous): Secondary | ICD-10-CM | POA: Diagnosis not present

## 2016-11-18 ENCOUNTER — Encounter: Payer: Self-pay | Admitting: Internal Medicine

## 2016-11-18 DIAGNOSIS — M810 Age-related osteoporosis without current pathological fracture: Secondary | ICD-10-CM

## 2016-11-18 DIAGNOSIS — G608 Other hereditary and idiopathic neuropathies: Secondary | ICD-10-CM | POA: Diagnosis not present

## 2016-11-18 NOTE — Telephone Encounter (Signed)
Order placed for bone density

## 2016-11-18 NOTE — Telephone Encounter (Signed)
Please schedule pt for bone density.  See her my chart message.  Wants at [facility].   Thanks

## 2016-11-19 ENCOUNTER — Encounter: Payer: Self-pay | Admitting: Internal Medicine

## 2016-11-20 ENCOUNTER — Encounter: Payer: Self-pay | Admitting: Internal Medicine

## 2016-11-20 NOTE — Telephone Encounter (Signed)
Letter typed, signed and printed and placed in box.  See my chart message.

## 2016-11-25 DIAGNOSIS — M519 Unspecified thoracic, thoracolumbar and lumbosacral intervertebral disc disorder: Secondary | ICD-10-CM | POA: Diagnosis not present

## 2016-11-25 DIAGNOSIS — S76312A Strain of muscle, fascia and tendon of the posterior muscle group at thigh level, left thigh, initial encounter: Secondary | ICD-10-CM | POA: Diagnosis not present

## 2016-11-25 DIAGNOSIS — Z79899 Other long term (current) drug therapy: Secondary | ICD-10-CM | POA: Diagnosis not present

## 2016-11-25 DIAGNOSIS — Q675 Congenital deformity of spine: Secondary | ICD-10-CM | POA: Diagnosis not present

## 2016-11-26 ENCOUNTER — Other Ambulatory Visit (INDEPENDENT_AMBULATORY_CARE_PROVIDER_SITE_OTHER): Payer: PPO

## 2016-11-26 DIAGNOSIS — I10 Essential (primary) hypertension: Secondary | ICD-10-CM

## 2016-11-26 DIAGNOSIS — E039 Hypothyroidism, unspecified: Secondary | ICD-10-CM | POA: Diagnosis not present

## 2016-11-26 DIAGNOSIS — E7800 Pure hypercholesterolemia, unspecified: Secondary | ICD-10-CM

## 2016-11-26 LAB — BASIC METABOLIC PANEL
BUN: 17 mg/dL (ref 6–23)
CHLORIDE: 100 meq/L (ref 96–112)
CO2: 36 mEq/L — ABNORMAL HIGH (ref 19–32)
CREATININE: 0.76 mg/dL (ref 0.40–1.20)
Calcium: 9.3 mg/dL (ref 8.4–10.5)
GFR: 77.11 mL/min (ref >60.00)
Glucose, Bld: 88 mg/dL (ref 70–99)
POTASSIUM: 4.3 meq/L (ref 3.5–5.1)
Sodium: 141 mEq/L (ref 135–145)

## 2016-11-26 LAB — LIPID PANEL
CHOL/HDL RATIO: 3
Cholesterol: 128 mg/dL (ref 0–200)
HDL: 40.8 mg/dL (ref >39.00)
LDL CALC: 66 mg/dL (ref 0–99)
NonHDL: 86.81
TRIGLYCERIDES: 104 mg/dL (ref 0.0–149.0)
VLDL: 20.8 mg/dL (ref 0.0–40.0)

## 2016-11-26 LAB — HEPATIC FUNCTION PANEL
ALK PHOS: 93 U/L (ref 39–117)
ALT: 8 U/L (ref 0–35)
AST: 14 U/L (ref 0–37)
Albumin: 4 g/dL (ref 3.5–5.2)
BILIRUBIN DIRECT: 0.1 mg/dL (ref 0.0–0.3)
BILIRUBIN TOTAL: 0.4 mg/dL (ref 0.2–1.2)
Total Protein: 6.6 g/dL (ref 6.0–8.3)

## 2016-11-26 LAB — TSH: TSH: 1.94 u[IU]/mL (ref 0.35–4.50)

## 2016-11-27 ENCOUNTER — Encounter: Payer: Self-pay | Admitting: Internal Medicine

## 2016-11-28 DIAGNOSIS — J449 Chronic obstructive pulmonary disease, unspecified: Secondary | ICD-10-CM | POA: Diagnosis not present

## 2016-12-02 DIAGNOSIS — M180 Bilateral primary osteoarthritis of first carpometacarpal joints: Secondary | ICD-10-CM | POA: Diagnosis not present

## 2016-12-02 DIAGNOSIS — M25532 Pain in left wrist: Secondary | ICD-10-CM | POA: Diagnosis not present

## 2016-12-02 DIAGNOSIS — M25531 Pain in right wrist: Secondary | ICD-10-CM | POA: Diagnosis not present

## 2016-12-13 DIAGNOSIS — D045 Carcinoma in situ of skin of trunk: Secondary | ICD-10-CM | POA: Diagnosis not present

## 2016-12-13 DIAGNOSIS — L309 Dermatitis, unspecified: Secondary | ICD-10-CM | POA: Diagnosis not present

## 2016-12-13 DIAGNOSIS — D485 Neoplasm of uncertain behavior of skin: Secondary | ICD-10-CM | POA: Diagnosis not present

## 2016-12-17 ENCOUNTER — Encounter: Payer: Self-pay | Admitting: Internal Medicine

## 2016-12-17 DIAGNOSIS — M7989 Other specified soft tissue disorders: Secondary | ICD-10-CM

## 2016-12-18 DIAGNOSIS — K5909 Other constipation: Secondary | ICD-10-CM | POA: Diagnosis not present

## 2016-12-18 DIAGNOSIS — K625 Hemorrhage of anus and rectum: Secondary | ICD-10-CM | POA: Diagnosis not present

## 2016-12-19 ENCOUNTER — Ambulatory Visit: Payer: PPO

## 2016-12-19 NOTE — Telephone Encounter (Signed)
Order placed for vascular surgery referral.

## 2016-12-23 ENCOUNTER — Other Ambulatory Visit: Payer: Self-pay | Admitting: Internal Medicine

## 2016-12-24 ENCOUNTER — Other Ambulatory Visit: Payer: Self-pay | Admitting: Internal Medicine

## 2016-12-25 ENCOUNTER — Ambulatory Visit (INDEPENDENT_AMBULATORY_CARE_PROVIDER_SITE_OTHER): Payer: PPO | Admitting: Vascular Surgery

## 2016-12-25 ENCOUNTER — Encounter (INDEPENDENT_AMBULATORY_CARE_PROVIDER_SITE_OTHER): Payer: Self-pay | Admitting: Vascular Surgery

## 2016-12-25 VITALS — BP 147/67 | HR 66 | Resp 16 | Ht 62.0 in | Wt 210.0 lb

## 2016-12-25 DIAGNOSIS — M7989 Other specified soft tissue disorders: Secondary | ICD-10-CM | POA: Diagnosis not present

## 2016-12-25 DIAGNOSIS — G629 Polyneuropathy, unspecified: Secondary | ICD-10-CM

## 2016-12-25 NOTE — Progress Notes (Signed)
Subjective:    Patient ID: Kimberly Roy, female    DOB: 06-11-1931, 83 y.o.   MRN: [id number] Chief Complaint  Patient presents with  . Re-evaluation    Leg swelling   Patient last seen in 2016. She presents at the request of Dr. Nicki Reaper for re-evaluation of bilateral lower extremity edema. Since the last visit in 2016, the patient has been wearing medical grade one compression stockings and engaging in elevation with minimal relief in symptoms. She is still experiencing swelling in her lower extremity L>R. The patient is also experiencing discomfort in her lower legs. She has been diagnosed with neuropathy however noticed her discomfort is worsened when her lower extremity is "swollen". She denies claudication, rest pain or ulceration to the lower extremity. Denies any fever, nausea or vomiting.    Review of Systems  Constitutional: Negative.   HENT: Negative.   Eyes: Negative.   Respiratory: Negative.   Cardiovascular: Positive for leg swelling.  Gastrointestinal: Negative.   Endocrine: Negative.   Genitourinary: Negative.   Musculoskeletal: Negative.   Skin: Negative.   Allergic/Immunologic: Negative.   Neurological: Negative.   Hematological: Negative.   Psychiatric/Behavioral: Negative.       Objective:   Physical Exam  Constitutional: She is oriented to person, place, and time. She appears well-developed and well-nourished.  HENT:  Head: Normocephalic and atraumatic.  Right Ear: External ear normal.  Left Ear: External ear normal.  Eyes: Conjunctivae are normal. Pupils are equal, round, and reactive to light.  Neck: Normal range of motion.  Cardiovascular: Normal rate, regular rhythm, normal heart sounds and intact distal pulses.   Pulses:      Radial pulses are 2+ on the right side, and 2+ on the left side.       Dorsalis pedis pulses are 2+ on the right side, and 2+ on the left side.       Posterior tibial pulses are 2+ on the right side, and 2+ on the left side.    Pulmonary/Chest: Effort normal.  Musculoskeletal: Normal range of motion. She exhibits edema (Moderate 1+ pitting edema (L>R)).  Neurological: She is alert and oriented to person, place, and time.  Skin: Skin is warm and dry.  Psychiatric: She has a normal mood and affect. Her behavior is normal. Judgment and thought content normal.  Vitals reviewed.  BP (!) 147/67 (BP Location: Left Arm)   Pulse 66   Resp 16   Ht _0  (1.575 m)   Wt 210 lb (95.3 kg)   BMI 38.41 kg/m   Past Medical History:  Diagnosis Date  . Anemia   . Anxiety   . Chest pain   . CHF (congestive heart failure) ([place])   . Constipation   . DDD (degenerative disc disease), cervical   . Depression   . DVT (deep venous thrombosis) ([place])   . Dysphonia   . Dyspnea   . Fatty liver   . Fatty liver   . Headache   . Hyperlipidemia   . Hyperpiesia   . Hypertension   . Hypothyroidism   . Interstitial cystitis   . Left ventricular dysfunction   . Lymphedema   . Nephrolithiasis   . Obstructive sleep apnea   . Osteoarthritis    knees/cervical and lumbar spine  . Pulmonary hypertension ([place])   . Pulmonary nodules    followed by Dr Raul Del  . Pure hypercholesterolemia   . Renal cyst    right   Social History   Social  History  . Marital status: Married    Spouse name: N/A  . Number of children: 2  . Years of education: N/A   Occupational History  . Not on file.   Social History Main Topics  . Smoking status: Never Smoker  . Smokeless tobacco: Never Used  . Alcohol use No  . Drug use: No  . Sexual activity: Not Currently   Other Topics Concern  . Not on file   Social History Narrative  . No narrative on file   Past Surgical History:  Procedure Laterality Date  . ABDOMINAL HYSTERECTOMY     ovaries left in place  . APPENDECTOMY    . Back Surgeries    . BREAST REDUCTION SURGERY     3/99  . CARDIAC CATHETERIZATION    . cataracts Bilateral   . CERVICAL SPINE SURGERY    . ESOPHAGEAL  MANOMETRY N/A 08/02/2015   Procedure: ESOPHAGEAL MANOMETRY (EM);  Surgeon: Josefine Class, MD;  Location: [facility] ENDOSCOPY;  Service: Endoscopy;  Laterality: N/A;  . ESOPHAGOGASTRODUODENOSCOPY N/A 02/27/2015   Procedure: ESOPHAGOGASTRODUODENOSCOPY (EGD);  Surgeon: Hulen Luster, MD;  Location: [facility] ENDOSCOPY;  Service: Gastroenterology;  Laterality: N/A;  . EXCISIONAL HEMORRHOIDECTOMY    . HEMORRHOID SURGERY    . HIP SURGERY  2013   Right hip surgery  . KNEE ARTHROSCOPY     left and right  . REDUCTION MAMMAPLASTY Bilateral YRS AGO  . REPLACEMENT TOTAL KNEE Bilateral   . rotator cuff surgery     blilateral  . TONSILECTOMY/ADENOIDECTOMY WITH MYRINGOTOMY     Family History  Problem Relation Age of Onset  . Heart disease Mother   . Stroke Mother   . Hypertension Mother   . Heart disease Father     myocardial infarction age 58  . Breast cancer Neg Hx    Allergies  Allergen Reactions  . Lyrica [Pregabalin] Swelling  . Atarax [Hydroxyzine]     jittery  . Dicyclomine Other (See Comments)    Abdominal bloating   . Hydroxyzine Hcl     jittery  . Levaquin [Levofloxacin] Swelling  . Nucynta Er [Tapentadol Hcl Er] Other (See Comments)    Severe constipation   . Oxybutynin Other (See Comments)    Blurred vision  . Zoloft [Sertraline Hcl]     Severe headache  . Biaxin [Clarithromycin] Other (See Comments) and Rash    Pt does not remember Pt does not remember  . Sertraline Nausea And Vomiting    Severe headache Severe headache Other reaction(s): Headache Severe headache  . Sulfa Antibiotics Rash    Pt does not remember  . Tape Rash    [place] - redness  . Tapentadol Other (See Comments) and Rash    _0       Assessment & Plan:  Patient last seen in 2016. She presents at the request of Dr. Nicki Reaper for re-evaluation of bilateral lower extremity edema. Since the last visit in 2016,  the patient has been wearing medical grade one compression stockings and engaging in elevation with minimal relief in symptoms. She is still experiencing swelling in her lower extremity L>R. The patient is also experiencing discomfort in her lower legs. She has been diagnosed with neuropathy however noticed her discomfort is worsened when her lower extremity is "swollen". She denies claudication, rest pain or ulceration to the lower extremity. Denies any fever, nausea or vomiting.   1. Leg swelling - Worsening Worsening edema and discomfort  since last visit in 2016. Patient with good pedal pulses. Will bring patient back for venous duplex to rule out reflux Continue conservative treatment for now Based on duplex results, may consider adding lymphedema pump  The patient was instructed to call the office in the interim if any worsening edema or ulcerations to the legs, feet or toes occurs. The patient expresses their understanding.  - VAS [place] LOWER EXTREMITY VENOUS REFLUX; Future  2. Neuropathy - Stable This may be a contributing factor to patients discomfort. Neuropathic pain may be exacerbated by edema.  Current Outpatient Prescriptions on File Prior to Visit  Medication Sig Dispense Refill  . aluminum-magnesium hydroxide-simethicone (MAALOX) [phone] MG/5ML SUSP Take by mouth.    Marland Kitchen amLODipine (NORVASC) 10 MG tablet TAKE 1 TABLET BY MOUTH EVERY DAY. 90 tablet 0  . atorvastatin (LIPITOR) 20 MG tablet TAKE ONE TABLET AT BEDTIME 90 tablet 3  . benazepril (LOTENSIN) 40 MG tablet TAKE ONE (1) TABLET EACH DAY 90 tablet 3  . Calcium-Magnesium-Vitamin D 600-40-500 MG-MG-UNIT TB24 Take 630 mg by mouth 4 (four) times daily.    [name] Sodium 30-100 MG CAPS Take by mouth.    . Cholecalciferol (VITAMIN D3) 1000 UNITS CAPS Take by mouth.    . ciprofloxacin (CIPRO) 500 MG tablet Take 1 tablet (500 mg total) by mouth 2 (two) times daily. 10 tablet 0  . DULoxetine (CYMBALTA) 60 MG capsule  TAKE 1 CAPSULE BY MOUTH EVERY DAY. 30 capsule 3  . fluticasone (VERAMYST) 27.5 MCG/SPRAY nasal spray Place 1 spray into the nose daily. 10 g 0  . furosemide (LASIX) 20 MG tablet TAKE ONE (1) TABLET EACH DAY AS NEEDED 30 tablet 3  . gabapentin (NEURONTIN) 300 MG capsule Take 300 mg by mouth 3 (three) times daily.     . hydrochlorothiazide (HYDRODIURIL) 25 MG tablet TAKE ONE (1) TABLET EACH DAY 90 tablet 1  . HYDROcodone-acetaminophen (NORCO) 10-325 MG per tablet Take 1 tablet by mouth every 4 (four) hours as needed.    . Inulin (FIBER CHOICE PO) Take 2 tablets by mouth daily. Chew tablets    . magnesium oxide (MAG-OX) 400 MG tablet Take 1 tablet (400 mg total) by mouth daily. 30 tablet 1  . metoprolol succinate (TOPROL-XL) 100 MG 24 hr tablet TALE 1 TABLET BY MOUTH TWICE A DAY WITH OR IMMEDIATELY FOLLOWING A MEAL 180 tablet 2  . morphine (MS CONTIN) 30 MG 12 hr tablet Take 30 mg by mouth daily. Takes around midday    . morphine (MS CONTIN) 60 MG 12 hr tablet Take 60 mg by mouth 2 (two) times daily.     . mupirocin ointment (BACTROBAN) 2 % Apply to affected area on back bid 22 g 0  . nystatin cream (MYCOSTATIN) Apply topically 2 (two) times daily. 30 g 1  . pantoprazole (PROTONIX) 40 MG tablet TAKE ONE TABLET TWICE DAILY 60 tablet 3  . polyethylene glycol powder (GLYCOLAX/MIRALAX) powder MIX 17 GRAMS (AS MARKED IN BOTTLE TOP) IN 8 OUNCES OF WATER, MIX AND DRINK ONCEA DAY AS DIRECTED. 527 g 1  . Probiotic Product (ALIGN PO) Take by mouth daily.    . ranitidine (ZANTAC) 150 MG tablet Take 150 mg by mouth 2 (two) times daily.    . sodium chloride (OCEAN) 0.65 % nasal spray Place 1 spray into the nose as needed.    Marland Kitchen SYNTHROID 112 MCG tablet TAKE ONE (1) TABLET EACH DAY 90 tablet 3   No current facility-administered medications on file prior  to visit.     There are no Patient Instructions on file for this visit. No Follow-up on file.   Akisha Sturgill A Carol Theys, PA-C

## 2016-12-29 DIAGNOSIS — J449 Chronic obstructive pulmonary disease, unspecified: Secondary | ICD-10-CM | POA: Diagnosis not present

## 2017-01-01 DIAGNOSIS — M180 Bilateral primary osteoarthritis of first carpometacarpal joints: Secondary | ICD-10-CM | POA: Diagnosis not present

## 2017-01-08 ENCOUNTER — Encounter (INDEPENDENT_AMBULATORY_CARE_PROVIDER_SITE_OTHER): Payer: Self-pay | Admitting: Vascular Surgery

## 2017-01-08 ENCOUNTER — Ambulatory Visit (INDEPENDENT_AMBULATORY_CARE_PROVIDER_SITE_OTHER): Payer: PPO

## 2017-01-08 ENCOUNTER — Ambulatory Visit (INDEPENDENT_AMBULATORY_CARE_PROVIDER_SITE_OTHER): Payer: PPO | Admitting: Vascular Surgery

## 2017-01-08 VITALS — BP 146/66 | HR 66 | Resp 17 | Ht 63.0 in | Wt 207.0 lb

## 2017-01-08 DIAGNOSIS — I872 Venous insufficiency (chronic) (peripheral): Secondary | ICD-10-CM | POA: Diagnosis not present

## 2017-01-08 DIAGNOSIS — M7989 Other specified soft tissue disorders: Secondary | ICD-10-CM | POA: Diagnosis not present

## 2017-01-08 DIAGNOSIS — I890 Lymphedema, not elsewhere classified: Secondary | ICD-10-CM | POA: Diagnosis not present

## 2017-01-10 ENCOUNTER — Other Ambulatory Visit: Payer: Self-pay | Admitting: Internal Medicine

## 2017-01-13 DIAGNOSIS — G253 Myoclonus: Secondary | ICD-10-CM | POA: Diagnosis not present

## 2017-01-13 DIAGNOSIS — G8929 Other chronic pain: Secondary | ICD-10-CM | POA: Diagnosis not present

## 2017-01-13 DIAGNOSIS — G608 Other hereditary and idiopathic neuropathies: Secondary | ICD-10-CM | POA: Diagnosis not present

## 2017-01-13 DIAGNOSIS — M545 Low back pain: Secondary | ICD-10-CM | POA: Diagnosis not present

## 2017-01-15 DIAGNOSIS — K219 Gastro-esophageal reflux disease without esophagitis: Secondary | ICD-10-CM | POA: Diagnosis not present

## 2017-01-15 DIAGNOSIS — H903 Sensorineural hearing loss, bilateral: Secondary | ICD-10-CM | POA: Diagnosis not present

## 2017-01-15 DIAGNOSIS — H6120 Impacted cerumen, unspecified ear: Secondary | ICD-10-CM | POA: Diagnosis not present

## 2017-01-15 DIAGNOSIS — H90A21 Sensorineural hearing loss, unilateral, right ear, with restricted hearing on the contralateral side: Secondary | ICD-10-CM | POA: Diagnosis not present

## 2017-01-22 ENCOUNTER — Other Ambulatory Visit: Payer: PPO

## 2017-01-23 DIAGNOSIS — D045 Carcinoma in situ of skin of trunk: Secondary | ICD-10-CM | POA: Diagnosis not present

## 2017-01-23 DIAGNOSIS — L820 Inflamed seborrheic keratosis: Secondary | ICD-10-CM | POA: Diagnosis not present

## 2017-01-23 DIAGNOSIS — D485 Neoplasm of uncertain behavior of skin: Secondary | ICD-10-CM | POA: Diagnosis not present

## 2017-01-28 ENCOUNTER — Other Ambulatory Visit: Payer: Self-pay | Admitting: Internal Medicine

## 2017-01-28 DIAGNOSIS — J449 Chronic obstructive pulmonary disease, unspecified: Secondary | ICD-10-CM | POA: Diagnosis not present

## 2017-01-31 ENCOUNTER — Ambulatory Visit (INDEPENDENT_AMBULATORY_CARE_PROVIDER_SITE_OTHER): Payer: PPO | Admitting: Internal Medicine

## 2017-01-31 ENCOUNTER — Encounter: Payer: Self-pay | Admitting: Internal Medicine

## 2017-01-31 VITALS — BP 130/68 | HR 62 | Temp 98.6°F | Resp 14 | Wt 208.0 lb

## 2017-01-31 DIAGNOSIS — M79605 Pain in left leg: Secondary | ICD-10-CM

## 2017-01-31 DIAGNOSIS — E669 Obesity, unspecified: Secondary | ICD-10-CM

## 2017-01-31 DIAGNOSIS — G4733 Obstructive sleep apnea (adult) (pediatric): Secondary | ICD-10-CM

## 2017-01-31 DIAGNOSIS — I10 Essential (primary) hypertension: Secondary | ICD-10-CM

## 2017-01-31 DIAGNOSIS — D649 Anemia, unspecified: Secondary | ICD-10-CM | POA: Diagnosis not present

## 2017-01-31 DIAGNOSIS — K219 Gastro-esophageal reflux disease without esophagitis: Secondary | ICD-10-CM

## 2017-01-31 DIAGNOSIS — I509 Heart failure, unspecified: Secondary | ICD-10-CM | POA: Diagnosis not present

## 2017-01-31 DIAGNOSIS — I272 Pulmonary hypertension, unspecified: Secondary | ICD-10-CM

## 2017-01-31 DIAGNOSIS — G629 Polyneuropathy, unspecified: Secondary | ICD-10-CM

## 2017-01-31 DIAGNOSIS — E7800 Pure hypercholesterolemia, unspecified: Secondary | ICD-10-CM

## 2017-01-31 DIAGNOSIS — M5440 Lumbago with sciatica, unspecified side: Secondary | ICD-10-CM

## 2017-01-31 DIAGNOSIS — G8929 Other chronic pain: Secondary | ICD-10-CM

## 2017-01-31 DIAGNOSIS — E039 Hypothyroidism, unspecified: Secondary | ICD-10-CM

## 2017-01-31 NOTE — Progress Notes (Signed)
Patient ID: ADDYSEN LOUTH, female   DOB: 1930-12-15, 83 y.o.   MRN: [id number]   Subjective:    Patient ID: [name], female    DOB: 10-23-1930, 83 y.o.   MRN: [id number]  HPI  Patient here for a scheduled follow up.  She is accompanied by her husband.  History obtained from both of them.  She reports she feel she is dong some better.  She is still having pain in her leg.  Plans to see Dr Foy Guadalajara at the end of June.  She saw Dr Manuella Ghazi.  Had EMG/NCS.  Found to have L5 radiculopathy (chronic) and sensorimotor polypneuropathy.  On gabatentin.  Increasing dose.  Is a little tired in the am.  Feels better as the day progresses.  No chest pain.  Breathing stable. No acid reflux.  No abdominal pain.  Bowels moving now.    Past Medical History:  Diagnosis Date  . Anemia   . Anxiety   . Chest pain   . CHF (congestive heart failure) ([place])   . Constipation   . DDD (degenerative disc disease), cervical   . Depression   . DVT (deep venous thrombosis) ([place])   . Dysphonia   . Dyspnea   . Fatty liver   . Fatty liver   . Headache   . Hyperlipidemia   . Hyperpiesia   . Hypertension   . Hypothyroidism   . Interstitial cystitis   . Left ventricular dysfunction   . Lymphedema   . Nephrolithiasis   . Obstructive sleep apnea   . Osteoarthritis    knees/cervical and lumbar spine  . Pulmonary hypertension ([place])   . Pulmonary nodules    followed by Dr Raul Del  . Pure hypercholesterolemia   . Renal cyst    right   Past Surgical History:  Procedure Laterality Date  . ABDOMINAL HYSTERECTOMY     ovaries left in place  . APPENDECTOMY    . Back Surgeries    . BREAST REDUCTION SURGERY     3/99  . CARDIAC CATHETERIZATION    . cataracts Bilateral   . CERVICAL SPINE SURGERY    . ESOPHAGEAL MANOMETRY N/A 08/02/2015   Procedure: ESOPHAGEAL MANOMETRY (EM);  Surgeon: Josefine Class, MD;  Location: [facility] ENDOSCOPY;  Service: Endoscopy;  Laterality: N/A;  . ESOPHAGOGASTRODUODENOSCOPY N/A 02/27/2015   Procedure: ESOPHAGOGASTRODUODENOSCOPY (EGD);  Surgeon: Hulen Luster, MD;  Location: [facility] ENDOSCOPY;  Service: Gastroenterology;  Laterality: N/A;  . EXCISIONAL HEMORRHOIDECTOMY    . HEMORRHOID SURGERY    . HIP SURGERY  2013   Right hip surgery  . KNEE ARTHROSCOPY     left and right  . REDUCTION MAMMAPLASTY Bilateral YRS AGO  . REPLACEMENT TOTAL KNEE Bilateral   . rotator cuff surgery     blilateral  . TONSILECTOMY/ADENOIDECTOMY WITH MYRINGOTOMY     Family History  Problem Relation Age of Onset  . Heart disease Mother   . Stroke Mother   . Hypertension Mother   . Heart disease Father        myocardial infarction age 52  . Breast cancer Neg Hx    Social History   Social History  . Marital status: Married    Spouse name: N/A  . Number of children: 2  . Years of education: N/A   Social History Main Topics  . Smoking status: Never Smoker  . Smokeless tobacco: Never Used  . Alcohol use No  . Drug use: No  . Sexual activity: Not Currently  Other Topics Concern  . None   Social History Narrative  . None    Outpatient Encounter Prescriptions as of 01/31/2017  Medication Sig  . aluminum-magnesium hydroxide-simethicone (MAALOX) [phone] MG/5ML SUSP Take by mouth.  . AMITIZA 24 MCG capsule Take 25 mg by mouth 2 (two) times daily.  Marland Kitchen amLODipine (NORVASC) 10 MG tablet TAKE 1 TABLET BY MOUTH EVERY DAY.  Marland Kitchen atorvastatin (LIPITOR) 20 MG tablet TAKE 1 TABLET BY MOUTH EVERY NIGHT AT BEDTIME.  . benazepril (LOTENSIN) 40 MG tablet TAKE 1 TABLET BY MOUTH EVERY DAY.  . Calcium-Magnesium-Vitamin D 600-40-500 MG-MG-UNIT TB24 Take 630 mg by mouth 4 (four) times daily.  [name] Sodium 30-100 MG CAPS Take by mouth.  . cephALEXin (KEFLEX) 250 MG capsule   . Cholecalciferol (VITAMIN D3) 1000 UNITS CAPS Take by mouth.  . DULoxetine (CYMBALTA) 60 MG capsule TAKE 1 CAPSULE BY MOUTH EVERY DAY.  . furosemide (LASIX) 20 MG tablet TAKE 1 TABLET BY MOUTH EVERY DAY AS NEEDED.  Marland Kitchen  gabapentin (NEURONTIN) 300 MG capsule Take 300 mg by mouth 3 (three) times daily.   . hydrochlorothiazide (HYDRODIURIL) 25 MG tablet TAKE ONE (1) TABLET EACH DAY  . HYDROcodone-acetaminophen (NORCO) 10-325 MG per tablet Take 1 tablet by mouth 3 (three) times daily.   . Inulin (FIBER CHOICE PO) Take 2 tablets by mouth daily. Chew tablets  . magnesium oxide (MAG-OX) 400 MG tablet Take 1 tablet (400 mg total) by mouth daily.  . metoprolol succinate (TOPROL-XL) 100 MG 24 hr tablet TALE 1 TABLET BY MOUTH TWICE A DAY WITH OR IMMEDIATELY FOLLOWING A MEAL  . morphine (MS CONTIN) 30 MG 12 hr tablet Take 30 mg by mouth daily. Takes around midday  . morphine (MS CONTIN) 60 MG 12 hr tablet Take 60 mg by mouth 2 (two) times daily.   . mupirocin ointment (BACTROBAN) 2 % Apply to affected area on back bid  . nystatin cream (MYCOSTATIN) Apply topically 2 (two) times daily.  . pantoprazole (PROTONIX) 40 MG tablet TAKE ONE TABLET TWICE DAILY  . polyethylene glycol powder (GLYCOLAX/MIRALAX) powder MIX 17 GRAMS AS MARKED IN BOTTLE TOP IN 8 OUNCES OF WATER MIX AND DRINK ONCE A DAY AS DIRECTED  . Probiotic Product (ALIGN PO) Take by mouth daily.  . ranitidine (ZANTAC) 150 MG tablet Take 150 mg by mouth 2 (two) times daily.  . sodium chloride (OCEAN) 0.65 % nasal spray Place 1 spray into the nose as needed.  Marland Kitchen SYNTHROID 112 MCG tablet TAKE ONE TABLET BY MOUTH DAILY. TAKE ON AN EMPTY STOMACH WITH A GLASSOF WATER 30-60 MINUTES BEFORE BREAKFAST  . triamcinolone cream (KENALOG) 0.1 %   . [DISCONTINUED] ciprofloxacin (CIPRO) 500 MG tablet Take 1 tablet (500 mg total) by mouth 2 (two) times daily.  . [DISCONTINUED] polyethylene glycol powder (GLYCOLAX/MIRALAX) powder MIX 17 GRAMS (AS MARKED IN BOTTLE TOP) IN 8 OUNCES OF WATER, MIX AND DRINK ONCEA DAY AS DIRECTED.  . fluticasone (VERAMYST) 27.5 MCG/SPRAY nasal spray Place 1 spray into the nose daily.   No facility-administered encounter medications on file as of 01/31/2017.       Review of Systems  Constitutional: Negative for appetite change and unexpected weight change.  HENT: Negative for congestion and sinus pressure.   Respiratory: Negative for cough, chest tightness and shortness of breath.   Cardiovascular: Negative for chest pain and palpitations.       Wearing compression hose.  Swelling overall improved.    Gastrointestinal: Negative for abdominal pain, diarrhea, nausea  and vomiting.  Genitourinary: Negative for difficulty urinating and dysuria.  Musculoskeletal:       Chronic back and leg pain.  Sees ortho.  Planing an appt with Dr Foy Guadalajara.    Skin: Negative for color change and rash.  Neurological: Negative for dizziness, light-headedness and headaches.  Psychiatric/Behavioral: Negative for agitation and dysphoric mood.       Objective:    Physical Exam  Constitutional: She appears well-developed and well-nourished. No distress.  HENT:  Nose: Nose normal.  Mouth/Throat: Oropharynx is clear and moist.  Neck: Neck supple. No thyromegaly present.  Cardiovascular: Normal rate and regular rhythm.   Pulmonary/Chest: Breath sounds normal. No respiratory distress. She has no wheezes.  Abdominal: Soft. Bowel sounds are normal. There is no tenderness.  Musculoskeletal: She exhibits no tenderness.  Lower extremity swelling improved with compression hose.    Lymphadenopathy:    She has no cervical adenopathy.  Skin: No rash noted. No erythema.  Psychiatric: She has a normal mood and affect. Her behavior is normal.    BP 130/68 (BP Location: Left Arm, Patient Position: Sitting, Cuff Size: Large)   Pulse 62   Temp 98.6 F (37 C) (Oral)   Resp 14   Wt 208 lb (94.3 kg)   SpO2 95%   BMI 36.85 kg/m  Wt Readings from Last 3 Encounters:  01/31/17 208 lb (94.3 kg)  01/08/17 207 lb (93.9 kg)  12/25/16 210 lb (95.3 kg)     Lab Results  Component Value Date   WBC 7.9 08/07/2016   HGB 11.0 (L) 08/07/2016   HCT 32.3 (L) 08/07/2016   PLT 172  08/07/2016   GLUCOSE 88 11/26/2016   CHOL 128 11/26/2016   TRIG 104.0 11/26/2016   HDL 40.80 11/26/2016   LDLCALC 66 11/26/2016   ALT 8 11/26/2016   AST 14 11/26/2016   NA 141 11/26/2016   K 4.3 11/26/2016   CL 100 11/26/2016   CREATININE 0.76 11/26/2016   BUN 17 11/26/2016   CO2 36 (H) 11/26/2016   TSH 1.94 11/26/2016   INR 1.0 12/08/2013   HGBA1C 6.0 10/19/2014    Mm Screening Breast Tomo Bilateral  Result Date: 11/08/2016 CLINICAL DATA:  Screening. EXAM: 2D DIGITAL SCREENING BILATERAL MAMMOGRAM WITH CAD AND ADJUNCT TOMO COMPARISON:  Previous exam(s). ACR Breast Density Category b: There are scattered areas of fibroglandular density. FINDINGS: There are no findings suspicious for malignancy. Technically difficult exam, as the patient is unable to stand and has difficulty with holding her breath for the images. Images were processed with CAD. IMPRESSION: No mammographic evidence of malignancy. A result letter of this screening mammogram will be mailed directly to the patient. RECOMMENDATION: Screening mammogram in one year. (Code:SM-B-01Y) BI-RADS CATEGORY  1: Negative. Electronically Signed   By: Curlene Dolphin M.D.   On: 11/08/2016 15:43       Assessment & Plan:   Problem List Items Addressed This Visit    Anemia    Seeing hematology.        Back pain    Chronic.  Followed by ortho/pain clinic.  Planning to see Dr Foy Guadalajara.        CHF (congestive heart failure) (HCC)    No evidence of volume overload on exam.  Follow.        GERD (gastroesophageal reflux disease)    Controlled on protonix.        Relevant Medications   AMITIZA 24 MCG capsule   Hypercholesterolemia    On lipitor.  Low cholesterol diet and exercise.  Follow lipid panel and liver function tests.        Relevant Orders   Hepatic function panel   Lipid panel   Hypertension    Blood pressure under good control.  Continue same medication regimen.  Follow pressures.  Follow metabolic panel.         Relevant Orders   Basic metabolic panel   Hypothyroidism    On thyroid replacement.  Follow tsh.       Neuropathy    Discussed recent testing by Dr Manuella Ghazi.  On gabapentin.  Follow.       Obesity (BMI 30-39.9)    Diet and exercise.  Follow.        OSA (obstructive sleep apnea)    CPAP.       Pulmonary hypertension ([place])    Has seen pulmonary.  Stable.         Other Visit Diagnoses    Pain of left lower extremity    -  Primary   Persistent. planning to see Dr Marry Guan for her knee.  has L5 radiculopathy.  planning to see Dr Foy Guadalajara.         Einar Pheasant, MD

## 2017-02-02 ENCOUNTER — Encounter: Payer: Self-pay | Admitting: Internal Medicine

## 2017-02-02 NOTE — Assessment & Plan Note (Signed)
On lipitor.  Low cholesterol diet and exercise.  Follow lipid panel and liver function tests.

## 2017-02-02 NOTE — Assessment & Plan Note (Signed)
No evidence of volume overload on exam.  Follow.

## 2017-02-02 NOTE — Assessment & Plan Note (Signed)
CPAP.  

## 2017-02-02 NOTE — Assessment & Plan Note (Signed)
Has seen pulmonary.  Stable.

## 2017-02-02 NOTE — Assessment & Plan Note (Signed)
Discussed recent testing by Dr Manuella Ghazi.  On gabapentin.  Follow.

## 2017-02-02 NOTE — Assessment & Plan Note (Signed)
On thyroid replacement.  Follow tsh.

## 2017-02-02 NOTE — Assessment & Plan Note (Signed)
Blood pressure under good control.  Continue same medication regimen.  Follow pressures.  Follow metabolic panel.   

## 2017-02-02 NOTE — Assessment & Plan Note (Signed)
Controlled on protonix.   

## 2017-02-02 NOTE — Assessment & Plan Note (Signed)
Diet and exercise.  Follow.

## 2017-02-02 NOTE — Assessment & Plan Note (Signed)
Seeing hematology.

## 2017-02-02 NOTE — Assessment & Plan Note (Signed)
Chronic.  Followed by ortho/pain clinic.  Planning to see Dr Foy Guadalajara.

## 2017-02-05 ENCOUNTER — Ambulatory Visit (INDEPENDENT_AMBULATORY_CARE_PROVIDER_SITE_OTHER): Payer: PPO | Admitting: Vascular Surgery

## 2017-02-10 ENCOUNTER — Encounter (INDEPENDENT_AMBULATORY_CARE_PROVIDER_SITE_OTHER): Payer: Self-pay | Admitting: Vascular Surgery

## 2017-02-10 ENCOUNTER — Ambulatory Visit (INDEPENDENT_AMBULATORY_CARE_PROVIDER_SITE_OTHER): Payer: PPO | Admitting: Vascular Surgery

## 2017-02-10 VITALS — BP 150/65 | HR 59 | Resp 16 | Wt 209.0 lb

## 2017-02-10 DIAGNOSIS — I872 Venous insufficiency (chronic) (peripheral): Secondary | ICD-10-CM | POA: Insufficient documentation

## 2017-02-10 DIAGNOSIS — I890 Lymphedema, not elsewhere classified: Secondary | ICD-10-CM

## 2017-02-10 NOTE — Progress Notes (Signed)
Subjective:    Patient ID: Kimberly Roy, female    DOB: 02-Aug-1931, 83 y.o.   MRN: [id number] Chief Complaint  Patient presents with  . Follow-up   Patient presents for a monthly follow-up. During last visit, the patient underwent a bilateral venous duplex which was notable for incompetence in the right greater saphenous vein. Over the last month, since her last visit the patient has been wearing medical grade 1 compression stockings, elevating her legs and remaining active. Even though she is engaging and conservative therapy on a daily basis including exercise elevation and class I compression stockings the patient continues to present with stage I lymphedema. The patient's lymphedema has progressed to the point where it interferes with her ability to function on a daily basis. The patient denies any fever nausea vomiting.    Review of Systems  Constitutional: Negative.   HENT: Negative.   Eyes: Negative.   Respiratory: Negative.   Cardiovascular: Positive for leg swelling.  Gastrointestinal: Negative.   Endocrine: Negative.   Genitourinary: Negative.   Musculoskeletal: Negative.   Skin: Negative.   Allergic/Immunologic: Negative.   Neurological: Negative.   Hematological: Negative.   Psychiatric/Behavioral: Negative.       Objective:   Physical Exam  Constitutional: She is oriented to person, place, and time. She appears well-developed and well-nourished. No distress.  Obese  HENT:  Head: Normocephalic and atraumatic.  Eyes: Conjunctivae are normal. Pupils are equal, round, and reactive to light.  Neck: Normal range of motion.  Cardiovascular: Normal rate, regular rhythm, normal heart sounds and intact distal pulses.   Pulses:      Radial pulses are 2+ on the right side, and 2+ on the left side.  Unable to palpate pedal pulses due to patient's edema and body habitus however her bilateral feet are warm.  Pulmonary/Chest: Effort normal.  Musculoskeletal: Normal range of  motion. She exhibits edema (Moderate 1+ pitting edema bilaterally).  Neurological: She is alert and oriented to person, place, and time.  Skin: Skin is warm and dry. She is not diaphoretic.  Psychiatric: She has a normal mood and affect. Her behavior is normal. Judgment and thought content normal.   BP (!) 150/65   Pulse (!) 59   Resp 16   Wt 209 lb (94.8 kg)   BMI 37.02 kg/m   Past Medical History:  Diagnosis Date  . Anemia   . Anxiety   . Chest pain   . CHF (congestive heart failure) ([place])   . Constipation   . DDD (degenerative disc disease), cervical   . Depression   . DVT (deep venous thrombosis) ([place])   . Dysphonia   . Dyspnea   . Fatty liver   . Fatty liver   . Headache   . Hyperlipidemia   . Hyperpiesia   . Hypertension   . Hypothyroidism   . Interstitial cystitis   . Left ventricular dysfunction   . Lymphedema   . Nephrolithiasis   . Obstructive sleep apnea   . Osteoarthritis    knees/cervical and lumbar spine  . Pulmonary hypertension ([place])   . Pulmonary nodules    followed by Dr Raul Del  . Pure hypercholesterolemia   . Renal cyst    right    Social History   Social History  . Marital status: Married    Spouse name: N/A  . Number of children: 2  . Years of education: N/A   Occupational History  . Not on file.   Social History  Main Topics  . Smoking status: Never Smoker  . Smokeless tobacco: Never Used  . Alcohol use No  . Drug use: No  . Sexual activity: Not Currently   Other Topics Concern  . Not on file   Social History Narrative  . No narrative on file    Past Surgical History:  Procedure Laterality Date  . ABDOMINAL HYSTERECTOMY     ovaries left in place  . APPENDECTOMY    . Back Surgeries    . BREAST REDUCTION SURGERY     3/99  . CARDIAC CATHETERIZATION    . cataracts Bilateral   . CERVICAL SPINE SURGERY    . ESOPHAGEAL MANOMETRY N/A 08/02/2015   Procedure: ESOPHAGEAL MANOMETRY (EM);  Surgeon: Josefine Class, MD;   Location: [facility] ENDOSCOPY;  Service: Endoscopy;  Laterality: N/A;  . ESOPHAGOGASTRODUODENOSCOPY N/A 02/27/2015   Procedure: ESOPHAGOGASTRODUODENOSCOPY (EGD);  Surgeon: Hulen Luster, MD;  Location: [facility] ENDOSCOPY;  Service: Gastroenterology;  Laterality: N/A;  . EXCISIONAL HEMORRHOIDECTOMY    . HEMORRHOID SURGERY    . HIP SURGERY  2013   Right hip surgery  . KNEE ARTHROSCOPY     left and right  . REDUCTION MAMMAPLASTY Bilateral YRS AGO  . REPLACEMENT TOTAL KNEE Bilateral   . rotator cuff surgery     blilateral  . TONSILECTOMY/ADENOIDECTOMY WITH MYRINGOTOMY      Family History  Problem Relation Age of Onset  . Heart disease Mother   . Stroke Mother   . Hypertension Mother   . Heart disease Father        myocardial infarction age 24  . Breast cancer Neg Hx     Allergies  Allergen Reactions  . Lyrica [Pregabalin] Swelling  . Atarax [Hydroxyzine]     jittery  . Dicyclomine Other (See Comments)    Abdominal bloating   . Hydroxyzine Hcl     jittery  . Levaquin [Levofloxacin] Swelling  . Nucynta Er [Tapentadol Hcl Er] Other (See Comments)    Severe constipation   . Oxybutynin Other (See Comments)    Blurred vision  . Zoloft [Sertraline Hcl]     Severe headache  . Biaxin [Clarithromycin] Other (See Comments) and Rash    Pt does not remember Pt does not remember  . Sertraline Nausea And Vomiting    Severe headache Severe headache Other reaction(s): Headache Severe headache  . Sulfa Antibiotics Rash    Pt does not remember  . Tape Rash    Durabond - redness  . Tapentadol Other (See Comments) and Rash    _0        Assessment & Plan:  Patient presents for a monthly follow-up. During last visit, the patient underwent a bilateral venous duplex which was notable for incompetence in the right greater saphenous vein. Over the last month, since her last visit the patient has been  wearing medical grade 1 compression stockings, elevating her legs and remaining active. Even though she is engaging and conservative therapy on a daily basis including exercise elevation and class I compression stockings the patient continues to present with stage I lymphedema. The patient's lymphedema has progressed to the point where it interferes with her ability to function on a daily basis. The patient denies any fever nausea vomiting.   1. Venous incompetence - stable Patient with incompetence of the right greater saphenous vein found on venous duplex.  Patient has failed conservative therapy.  2. Lymphedema - worsening Despite conservative treatments  including exercise, elevation and class one compression stockings, the patient still presents with stage one lymphedema. The patient will greatly benefit from the added therapy of the lymphedema pump. The patient is in agreement. Will apply. Patient to follow up in 3 months to assess progress with the lymphedema pump.   Current Outpatient Prescriptions on File Prior to Visit  Medication Sig Dispense Refill  . aluminum-magnesium hydroxide-simethicone (MAALOX) 865-784-69 MG/5ML SUSP Take by mouth.    . AMITIZA 24 MCG capsule Take 25 mg by mouth 2 (two) times daily.    Marland Kitchen amLODipine (NORVASC) 10 MG tablet TAKE 1 TABLET BY MOUTH EVERY DAY. 90 tablet 1  . atorvastatin (LIPITOR) 20 MG tablet TAKE 1 TABLET BY MOUTH EVERY NIGHT AT BEDTIME. 90 tablet 1  . benazepril (LOTENSIN) 40 MG tablet TAKE 1 TABLET BY MOUTH EVERY DAY. 90 tablet 1  . Calcium-Magnesium-Vitamin D 600-40-500 MG-MG-UNIT TB24 Take 630 mg by mouth 4 (four) times daily.    [name] Sodium 30-100 MG CAPS Take by mouth.    . cephALEXin (KEFLEX) 250 MG capsule     . Cholecalciferol (VITAMIN D3) 1000 UNITS CAPS Take by mouth.    . DULoxetine (CYMBALTA) 60 MG capsule TAKE 1 CAPSULE BY MOUTH EVERY DAY. 30 capsule 3  . furosemide (LASIX) 20 MG tablet TAKE 1 TABLET BY MOUTH  EVERY DAY AS NEEDED. 90 tablet 1  . gabapentin (NEURONTIN) 300 MG capsule Take 300 mg by mouth 3 (three) times daily.     . hydrochlorothiazide (HYDRODIURIL) 25 MG tablet TAKE ONE (1) TABLET EACH DAY 90 tablet 1  . HYDROcodone-acetaminophen (NORCO) 10-325 MG per tablet Take 1 tablet by mouth 3 (three) times daily.     . Inulin (FIBER CHOICE PO) Take 2 tablets by mouth daily. Chew tablets    . magnesium oxide (MAG-OX) 400 MG tablet Take 1 tablet (400 mg total) by mouth daily. 30 tablet 1  . metoprolol succinate (TOPROL-XL) 100 MG 24 hr tablet TALE 1 TABLET BY MOUTH TWICE A DAY WITH OR IMMEDIATELY FOLLOWING A MEAL 180 tablet 2  . morphine (MS CONTIN) 30 MG 12 hr tablet Take 30 mg by mouth daily. Takes around midday    . morphine (MS CONTIN) 60 MG 12 hr tablet Take 60 mg by mouth 2 (two) times daily.     . mupirocin ointment (BACTROBAN) 2 % Apply to affected area on back bid 22 g 0  . nystatin cream (MYCOSTATIN) Apply topically 2 (two) times daily. 30 g 1  . pantoprazole (PROTONIX) 40 MG tablet TAKE ONE TABLET TWICE DAILY 60 tablet 3  . polyethylene glycol powder (GLYCOLAX/MIRALAX) powder MIX 17 GRAMS AS MARKED IN BOTTLE TOP IN 8 OUNCES OF WATER MIX AND DRINK ONCE A DAY AS DIRECTED 527 g 3  . Probiotic Product (ALIGN PO) Take by mouth daily.    . ranitidine (ZANTAC) 150 MG tablet Take 150 mg by mouth 2 (two) times daily.    . sodium chloride (OCEAN) 0.65 % nasal spray Place 1 spray into the nose as needed.    Marland Kitchen SYNTHROID 112 MCG tablet TAKE ONE TABLET BY MOUTH DAILY. TAKE ON AN EMPTY STOMACH WITH A GLASSOF WATER 30-60 MINUTES BEFORE BREAKFAST 90 tablet 1  . triamcinolone cream (KENALOG) 0.1 %     . fluticasone (VERAMYST) 27.5 MCG/SPRAY nasal spray Place 1 spray into the nose daily. 10 g 0   No current facility-administered medications on file prior to visit.     There are no Patient  Instructions on file for this visit. No Follow-up on file.   Kimberly Roy A Kimberly Pesce, PA-C

## 2017-02-18 NOTE — Progress Notes (Signed)
Subjective:    Patient ID: Kimberly Roy, female    DOB: [birth year], 83 y.o.   MRN: [id number] Chief Complaint  Patient presents with  . Re-evaluation    Ultrasound follow up   Patient presents to review vascular studies. The patient was last seen on 12/25/2016 for evaluation of lower extremity edema and pain. Since our our original visit, the patient has been engaging in conservative therapy consisting of wearing medical grade 1 compression stockings, elevating her legs and remaining active. The patient underwent a bilateral lower extremity venous reflux exam which was notable for no deep vein thrombosis, no superficial vein thrombosis, incompetence of the right greater saphenous vein. Patient denies fever, nausea or vomiting.   Review of Systems  Constitutional: Negative.   HENT: Negative.   Eyes: Negative.   Respiratory: Negative.   Cardiovascular:       Lower extremity pain  Gastrointestinal: Negative.   Endocrine: Negative.   Genitourinary: Negative.   Musculoskeletal: Negative.   Skin: Negative.   Allergic/Immunologic: Negative.   Neurological: Negative.   Hematological: Negative.   Psychiatric/Behavioral: Negative.       Objective:   Physical Exam  Constitutional: She is oriented to person, place, and time. She appears well-developed and well-nourished. No distress.  HENT:  Head: Normocephalic and atraumatic.  Eyes: Conjunctivae are normal. Pupils are equal, round, and reactive to light.  Neck: Normal range of motion.  Cardiovascular: Normal rate, regular rhythm, normal heart sounds and intact distal pulses.   Pulses:      Radial pulses are 2+ on the right side, and 2+ on the left side.  Pulmonary/Chest: Effort normal.  Musculoskeletal: Normal range of motion. She exhibits edema (Moderate bilateral edema noted).  Neurological: She is alert and oriented to person, place, and time.  Skin: Skin is warm and dry. She is not diaphoretic.  Psychiatric: She has a normal mood  and affect. Her behavior is normal. Judgment and thought content normal.  Vitals reviewed.   BP (!) 146/66 (BP Location: Right Arm)   Pulse 66   Resp 17   Ht _0  (1.6 m)   Wt 207 lb (93.9 kg)   BMI 36.67 kg/m   Past Medical History:  Diagnosis Date  . Anemia   . Anxiety   . Chest pain   . CHF (congestive heart failure) ([place])   . Constipation   . DDD (degenerative disc disease), cervical   . Depression   . DVT (deep venous thrombosis) ([place])   . Dysphonia   . Dyspnea   . Fatty liver   . Fatty liver   . Headache   . Hyperlipidemia   . Hyperpiesia   . Hypertension   . Hypothyroidism   . Interstitial cystitis   . Left ventricular dysfunction   . Lymphedema   . Nephrolithiasis   . Obstructive sleep apnea   . Osteoarthritis    knees/cervical and lumbar spine  . Pulmonary hypertension ([place])   . Pulmonary nodules    followed by Dr Raul Del  . Pure hypercholesterolemia   . Renal cyst    right    Social History   Social History  . Marital status: Married    Spouse name: N/A  . Number of children: 2  . Years of education: N/A   Occupational History  . Not on file.   Social History Main Topics  . Smoking status: Never Smoker  . Smokeless tobacco: Never Used  . Alcohol use No  . Drug use:  No  . Sexual activity: Not Currently   Other Topics Concern  . Not on file   Social History Narrative  . No narrative on file    Past Surgical History:  Procedure Laterality Date  . ABDOMINAL HYSTERECTOMY     ovaries left in place  . APPENDECTOMY    . Back Surgeries    . BREAST REDUCTION SURGERY     3/99  . CARDIAC CATHETERIZATION    . cataracts Bilateral   . CERVICAL SPINE SURGERY    . ESOPHAGEAL MANOMETRY N/A 08/02/2015   Procedure: ESOPHAGEAL MANOMETRY (EM);  Surgeon: Josefine Class, MD;  Location: [facility] ENDOSCOPY;  Service: Endoscopy;  Laterality: N/A;  . ESOPHAGOGASTRODUODENOSCOPY N/A 02/27/2015   Procedure: ESOPHAGOGASTRODUODENOSCOPY (EGD);  Surgeon:  Hulen Luster, MD;  Location: [facility] ENDOSCOPY;  Service: Gastroenterology;  Laterality: N/A;  . EXCISIONAL HEMORRHOIDECTOMY    . HEMORRHOID SURGERY    . HIP SURGERY  2013   Right hip surgery  . KNEE ARTHROSCOPY     left and right  . REDUCTION MAMMAPLASTY Bilateral YRS AGO  . REPLACEMENT TOTAL KNEE Bilateral   . rotator cuff surgery     blilateral  . TONSILECTOMY/ADENOIDECTOMY WITH MYRINGOTOMY      Family History  Problem Relation Age of Onset  . Heart disease Mother   . Stroke Mother   . Hypertension Mother   . Heart disease Father        myocardial infarction age 11  . Breast cancer Neg Hx     Allergies  Allergen Reactions  . Lyrica [Pregabalin] Swelling  . Atarax [Hydroxyzine]     jittery  . Dicyclomine Other (See Comments)    Abdominal bloating   . Hydroxyzine Hcl     jittery  . Levaquin [Levofloxacin] Swelling  . Nucynta Er [Tapentadol Hcl Er] Other (See Comments)    Severe constipation   . Oxybutynin Other (See Comments)    Blurred vision  . Zoloft [Sertraline Hcl]     Severe headache  . Biaxin [Clarithromycin] Other (See Comments) and Rash    Pt does not remember Pt does not remember  . Sertraline Nausea And Vomiting    Severe headache Severe headache Other reaction(s): Headache Severe headache  . Sulfa Antibiotics Rash    Pt does not remember  . Tape Rash    Durabond - redness  . Tapentadol Other (See Comments) and Rash    _0        Assessment & Plan:  Patient presents to review vascular studies. The patient was last seen on 12/25/2016 for evaluation of lower extremity edema and pain. Since our our original visit, the patient has been engaging in conservative therapy consisting of wearing medical grade 1 compression stockings, elevating her legs and remaining active. The patient underwent a bilateral lower extremity venous reflux exam which was notable for  no deep vein thrombosis, no superficial vein thrombosis, incompetence of the right greater saphenous vein. Patient denies fever, nausea or vomiting.  1. Chronic venous insufficiency - new Patient with right greater saphenous vein reflux. I have discussed chronic venous insufficiency with the patient, why it causes symptoms and how to manage them.  We also discussed lymphedema, why it causes symptoms and how to manage them. The patient was encouraged to wear graduated compression stockings (20-30 mmHg) on a daily basis. The patient was instructed to begin wearing the stockings first thing in the morning and removing them in the evening.  The patient was instructed specifically not to sleep in the stockings. The patient will continue to do this Behavioral modification including elevation during the day will be continued. Anti-inflammatories for pain. The patient is likely to benefit from endovenous laser ablation. I have discussed the risks and benefits of the procedure. The risks primarily include DVT, recanalization, bleeding, infection, and inability to gain access. The patient is unsure if she would like to proceed with laser ablation at this time. Patient to follow up in 2 months to assess conservative therapy. The patient was instructed to call the office in the interim if any worsening edema or ulcerations to the legs, feet or toes occurs. The patient expresses their understanding.  2. Lymphedema - New Encouraged good control as its slows the progression of atherosclerotic disease   Current Outpatient Prescriptions on File Prior to Visit  Medication Sig Dispense Refill  . aluminum-magnesium hydroxide-simethicone (MAALOX) 366-294-76 MG/5ML SUSP Take by mouth.    . Calcium-Magnesium-Vitamin D 600-40-500 MG-MG-UNIT TB24 Take 630 mg by mouth 4 (four) times daily.    [name] Sodium 30-100 MG CAPS Take by mouth.    . Cholecalciferol (VITAMIN D3) 1000 UNITS CAPS Take by mouth.      . DULoxetine (CYMBALTA) 60 MG capsule TAKE 1 CAPSULE BY MOUTH EVERY DAY. 30 capsule 3  . fluticasone (VERAMYST) 27.5 MCG/SPRAY nasal spray Place 1 spray into the nose daily. 10 g 0  . gabapentin (NEURONTIN) 300 MG capsule Take 300 mg by mouth 3 (three) times daily.     . hydrochlorothiazide (HYDRODIURIL) 25 MG tablet TAKE ONE (1) TABLET EACH DAY 90 tablet 1  . HYDROcodone-acetaminophen (NORCO) 10-325 MG per tablet Take 1 tablet by mouth 3 (three) times daily.     . Inulin (FIBER CHOICE PO) Take 2 tablets by mouth daily. Chew tablets    . magnesium oxide (MAG-OX) 400 MG tablet Take 1 tablet (400 mg total) by mouth daily. 30 tablet 1  . metoprolol succinate (TOPROL-XL) 100 MG 24 hr tablet TALE 1 TABLET BY MOUTH TWICE A DAY WITH OR IMMEDIATELY FOLLOWING A MEAL 180 tablet 2  . morphine (MS CONTIN) 30 MG 12 hr tablet Take 30 mg by mouth daily. Takes around midday    . morphine (MS CONTIN) 60 MG 12 hr tablet Take 60 mg by mouth 2 (two) times daily.     . mupirocin ointment (BACTROBAN) 2 % Apply to affected area on back bid 22 g 0  . nystatin cream (MYCOSTATIN) Apply topically 2 (two) times daily. 30 g 1  . pantoprazole (PROTONIX) 40 MG tablet TAKE ONE TABLET TWICE DAILY 60 tablet 3  . Probiotic Product (ALIGN PO) Take by mouth daily.    . ranitidine (ZANTAC) 150 MG tablet Take 150 mg by mouth 2 (two) times daily.    . sodium chloride (OCEAN) 0.65 % nasal spray Place 1 spray into the nose as needed.     No current facility-administered medications on file prior to visit.     There are no Patient Instructions on file for this visit. No Follow-up on file.   [place] A Jalani Cullifer, PA-C

## 2017-02-19 ENCOUNTER — Other Ambulatory Visit: Payer: PPO

## 2017-02-19 ENCOUNTER — Ambulatory Visit: Payer: PPO | Admitting: Internal Medicine

## 2017-02-20 ENCOUNTER — Other Ambulatory Visit: Payer: Self-pay | Admitting: Internal Medicine

## 2017-02-24 DIAGNOSIS — G8929 Other chronic pain: Secondary | ICD-10-CM | POA: Diagnosis not present

## 2017-02-24 DIAGNOSIS — M519 Unspecified thoracic, thoracolumbar and lumbosacral intervertebral disc disorder: Secondary | ICD-10-CM | POA: Diagnosis not present

## 2017-02-24 DIAGNOSIS — Z79891 Long term (current) use of opiate analgesic: Secondary | ICD-10-CM | POA: Diagnosis not present

## 2017-02-24 DIAGNOSIS — S76312A Strain of muscle, fascia and tendon of the posterior muscle group at thigh level, left thigh, initial encounter: Secondary | ICD-10-CM | POA: Diagnosis not present

## 2017-02-24 DIAGNOSIS — M5416 Radiculopathy, lumbar region: Secondary | ICD-10-CM | POA: Diagnosis not present

## 2017-02-24 DIAGNOSIS — Q675 Congenital deformity of spine: Secondary | ICD-10-CM | POA: Diagnosis not present

## 2017-02-24 DIAGNOSIS — Z79899 Other long term (current) drug therapy: Secondary | ICD-10-CM | POA: Diagnosis not present

## 2017-02-25 DIAGNOSIS — G8929 Other chronic pain: Secondary | ICD-10-CM | POA: Diagnosis not present

## 2017-02-25 DIAGNOSIS — M545 Low back pain: Secondary | ICD-10-CM | POA: Diagnosis not present

## 2017-02-25 DIAGNOSIS — G253 Myoclonus: Secondary | ICD-10-CM | POA: Diagnosis not present

## 2017-02-26 DIAGNOSIS — H16223 Keratoconjunctivitis sicca, not specified as Sjogren's, bilateral: Secondary | ICD-10-CM | POA: Diagnosis not present

## 2017-02-27 DIAGNOSIS — M5442 Lumbago with sciatica, left side: Secondary | ICD-10-CM | POA: Diagnosis not present

## 2017-02-27 DIAGNOSIS — G8929 Other chronic pain: Secondary | ICD-10-CM | POA: Diagnosis not present

## 2017-02-27 DIAGNOSIS — Z96652 Presence of left artificial knee joint: Secondary | ICD-10-CM | POA: Diagnosis not present

## 2017-02-27 DIAGNOSIS — Z96651 Presence of right artificial knee joint: Secondary | ICD-10-CM | POA: Diagnosis not present

## 2017-02-28 DIAGNOSIS — L820 Inflamed seborrheic keratosis: Secondary | ICD-10-CM | POA: Diagnosis not present

## 2017-02-28 DIAGNOSIS — R58 Hemorrhage, not elsewhere classified: Secondary | ICD-10-CM | POA: Diagnosis not present

## 2017-02-28 DIAGNOSIS — J449 Chronic obstructive pulmonary disease, unspecified: Secondary | ICD-10-CM | POA: Diagnosis not present

## 2017-03-04 ENCOUNTER — Ambulatory Visit (INDEPENDENT_AMBULATORY_CARE_PROVIDER_SITE_OTHER): Payer: PPO | Admitting: Vascular Surgery

## 2017-03-04 ENCOUNTER — Encounter (INDEPENDENT_AMBULATORY_CARE_PROVIDER_SITE_OTHER): Payer: PPO

## 2017-03-06 ENCOUNTER — Telehealth (INDEPENDENT_AMBULATORY_CARE_PROVIDER_SITE_OTHER): Payer: Self-pay

## 2017-03-06 NOTE — Telephone Encounter (Signed)
Debbie from [facility] called stating that the patient needed to have measurements before payment for the lymphedema pump will be approved. I explained to her that we do not do measurements in our office so I have no way of getting measurements, she stated that the request should be withdrawn until measurements are done. So I withdrew the request for a lymphedema pump.

## 2017-03-11 DIAGNOSIS — K220 Achalasia of cardia: Secondary | ICD-10-CM | POA: Diagnosis not present

## 2017-03-11 DIAGNOSIS — Z79891 Long term (current) use of opiate analgesic: Secondary | ICD-10-CM | POA: Diagnosis not present

## 2017-03-12 ENCOUNTER — Other Ambulatory Visit: Payer: PPO

## 2017-03-12 ENCOUNTER — Ambulatory Visit: Payer: PPO | Admitting: Internal Medicine

## 2017-03-12 DIAGNOSIS — M5416 Radiculopathy, lumbar region: Secondary | ICD-10-CM | POA: Diagnosis not present

## 2017-03-17 DIAGNOSIS — F330 Major depressive disorder, recurrent, mild: Secondary | ICD-10-CM | POA: Diagnosis not present

## 2017-03-17 DIAGNOSIS — F5105 Insomnia due to other mental disorder: Secondary | ICD-10-CM | POA: Diagnosis not present

## 2017-03-17 DIAGNOSIS — F419 Anxiety disorder, unspecified: Secondary | ICD-10-CM | POA: Diagnosis not present

## 2017-03-18 ENCOUNTER — Ambulatory Visit
Admission: RE | Admit: 2017-03-18 | Discharge: 2017-03-18 | Disposition: A | Discharge status: Home / Self Care | Payer: PPO | Source: Ambulatory Visit | Attending: Internal Medicine | Admitting: Internal Medicine

## 2017-03-18 DIAGNOSIS — M810 Age-related osteoporosis without current pathological fracture: Secondary | ICD-10-CM | POA: Diagnosis not present

## 2017-03-18 DIAGNOSIS — Z780 Asymptomatic menopausal state: Secondary | ICD-10-CM | POA: Diagnosis not present

## 2017-03-19 ENCOUNTER — Encounter: Payer: Self-pay | Admitting: Internal Medicine

## 2017-03-19 ENCOUNTER — Encounter: Payer: Self-pay | Admitting: *Deleted

## 2017-03-19 DIAGNOSIS — G4733 Obstructive sleep apnea (adult) (pediatric): Secondary | ICD-10-CM

## 2017-03-19 DIAGNOSIS — I272 Pulmonary hypertension, unspecified: Secondary | ICD-10-CM

## 2017-03-20 NOTE — Telephone Encounter (Signed)
Pt desires to change from Dr [facility] pulmonary) to Dr Raul Del [facility].  Do I need to place another referral.

## 2017-03-25 NOTE — Telephone Encounter (Signed)
Order placed for pulmonary referral to Dr Raul Del.

## 2017-03-25 NOTE — Telephone Encounter (Signed)
Yes please place a referral and I will get it sent to Dr. Raul Del.

## 2017-03-26 ENCOUNTER — Inpatient Hospital Stay (HOSPITAL_BASED_OUTPATIENT_CLINIC_OR_DEPARTMENT_OTHER): Payer: PPO | Admitting: Internal Medicine

## 2017-03-26 ENCOUNTER — Inpatient Hospital Stay: Payer: PPO | Attending: Internal Medicine

## 2017-03-26 VITALS — BP 130/73 | HR 54 | Temp 98.4°F | Resp 16

## 2017-03-26 DIAGNOSIS — M1990 Unspecified osteoarthritis, unspecified site: Secondary | ICD-10-CM | POA: Insufficient documentation

## 2017-03-26 DIAGNOSIS — M5030 Other cervical disc degeneration, unspecified cervical region: Secondary | ICD-10-CM

## 2017-03-26 DIAGNOSIS — R918 Other nonspecific abnormal finding of lung field: Secondary | ICD-10-CM | POA: Diagnosis not present

## 2017-03-26 DIAGNOSIS — E7800 Pure hypercholesterolemia, unspecified: Secondary | ICD-10-CM

## 2017-03-26 DIAGNOSIS — E785 Hyperlipidemia, unspecified: Secondary | ICD-10-CM

## 2017-03-26 DIAGNOSIS — Z79899 Other long term (current) drug therapy: Secondary | ICD-10-CM | POA: Insufficient documentation

## 2017-03-26 DIAGNOSIS — N3010 Interstitial cystitis (chronic) without hematuria: Secondary | ICD-10-CM

## 2017-03-26 DIAGNOSIS — I509 Heart failure, unspecified: Secondary | ICD-10-CM | POA: Diagnosis not present

## 2017-03-26 DIAGNOSIS — E039 Hypothyroidism, unspecified: Secondary | ICD-10-CM | POA: Diagnosis not present

## 2017-03-26 DIAGNOSIS — Z87442 Personal history of urinary calculi: Secondary | ICD-10-CM | POA: Diagnosis not present

## 2017-03-26 DIAGNOSIS — R609 Edema, unspecified: Secondary | ICD-10-CM | POA: Insufficient documentation

## 2017-03-26 DIAGNOSIS — G4730 Sleep apnea, unspecified: Secondary | ICD-10-CM

## 2017-03-26 DIAGNOSIS — F329 Major depressive disorder, single episode, unspecified: Secondary | ICD-10-CM | POA: Diagnosis not present

## 2017-03-26 DIAGNOSIS — F419 Anxiety disorder, unspecified: Secondary | ICD-10-CM

## 2017-03-26 DIAGNOSIS — I519 Heart disease, unspecified: Secondary | ICD-10-CM

## 2017-03-26 DIAGNOSIS — Z86718 Personal history of other venous thrombosis and embolism: Secondary | ICD-10-CM | POA: Diagnosis not present

## 2017-03-26 DIAGNOSIS — R0602 Shortness of breath: Secondary | ICD-10-CM | POA: Diagnosis not present

## 2017-03-26 DIAGNOSIS — D500 Iron deficiency anemia secondary to blood loss (chronic): Secondary | ICD-10-CM

## 2017-03-26 DIAGNOSIS — I10 Essential (primary) hypertension: Secondary | ICD-10-CM | POA: Insufficient documentation

## 2017-03-26 LAB — CBC WITH DIFFERENTIAL/PLATELET
BASOS ABS: 0.3 10*3/uL — AB (ref 0–0.1)
Basophils Relative: 4 %
Eosinophils Absolute: 0.1 10*3/uL (ref 0–0.7)
Eosinophils Relative: 2 %
HEMATOCRIT: 30.2 % — AB (ref 35.0–47.0)
Hemoglobin: 10.3 g/dL — ABNORMAL LOW (ref 12.0–16.0)
LYMPHS ABS: 1.3 10*3/uL (ref 1.0–3.6)
LYMPHS PCT: 18 %
MCH: 32.4 pg (ref 26.0–34.0)
MCHC: 34.2 g/dL (ref 32.0–36.0)
MCV: 94.8 fL (ref 80.0–100.0)
MONO ABS: 0.5 10*3/uL (ref 0.2–0.9)
Monocytes Relative: 8 %
NEUTROS ABS: 4.8 10*3/uL (ref 1.4–6.5)
Neutrophils Relative %: 68 %
Platelets: 188 10*3/uL (ref 150–440)
RBC: 3.19 MIL/uL — ABNORMAL LOW (ref 3.80–5.20)
RDW: 14.4 % (ref 11.5–14.5)
WBC: 7 10*3/uL (ref 3.6–11.0)

## 2017-03-26 LAB — BASIC METABOLIC PANEL
ANION GAP: 6 (ref 5–15)
BUN: 16 mg/dL (ref 6–20)
CALCIUM: 8.9 mg/dL (ref 8.9–10.3)
CO2: 28 mmol/L (ref 22–32)
Chloride: 101 mmol/L (ref 101–111)
Creatinine, Ser: 0.78 mg/dL (ref 0.44–1.00)
GFR calc Af Amer: >60 mL/min (ref >60)
GFR calc non Af Amer: >60 mL/min (ref >60)
GLUCOSE: 98 mg/dL (ref 65–99)
POTASSIUM: 3.9 mmol/L (ref 3.5–5.1)
Sodium: 135 mmol/L (ref 135–145)

## 2017-03-26 LAB — IRON AND TIBC
Iron: 71 ug/dL (ref 28–170)
SATURATION RATIOS: 24 % (ref 10.4–31.8)
TIBC: 292 ug/dL (ref 250–450)
UIBC: 221 ug/dL

## 2017-03-26 LAB — FERRITIN: FERRITIN: 47 ng/mL (ref 11–307)

## 2017-03-26 NOTE — Progress Notes (Signed)
Patient is here for a follow up. Patient states no new concerns today.

## 2017-03-26 NOTE — Progress Notes (Signed)
[place] NOTE  Patient Care Team: Einar Pheasant, [name], [name], [name], MD (Otolaryngology)  CHIEF COMPLAINTS/PURPOSE OF CONSULTATION: Anemia  # CHRONIC MILD ANEMIA hb ~11.[Previous Dr.Gittin pt]; [place] 2017- work up [place] [employer]; stool card; Iron/ monoclonal work up- NEGATIVE]   # CHRONIC INTERSTITIAL CYSTITIS/ wheel chair bound   No history exists.     HISTORY OF PRESENTING ILLNESS: pt is a poor historian.  Kimberly Roy 83 y.o.  female  with a history of chronic anemia- is here for a follow up.    Patient denies any abdominal pain. Denies any dysuria. Denies any blood in urine. Denies any chest pain or shortness of breath or cough;  denies any difficulty swallowing. Patient is chronic swelling in the legs. She is in a wheelchair.   ROS: A complete 10 point review of system is done which is negative except mentioned above in history of present illness  MEDICAL HISTORY:  Past Medical History:  Diagnosis Date  . Anemia   . Anxiety   . Chest pain   . CHF (congestive heart failure) ([place])   . Constipation   . DDD (degenerative disc disease), cervical   . Depression   . DVT (deep venous thrombosis) ([place])   . Dysphonia   . Dyspnea   . Fatty liver   . Fatty liver   . Headache   . Hyperlipidemia   . Hyperpiesia   . Hypertension   . Hypothyroidism   . Interstitial cystitis   . Left ventricular dysfunction   . Lymphedema   . Nephrolithiasis   . Obstructive sleep apnea   . Osteoarthritis    knees/cervical and lumbar spine  . Pulmonary hypertension ([place])   . Pulmonary nodules    followed by Dr Raul Del  . Pure hypercholesterolemia   . Renal cyst    right    SURGICAL HISTORY: Past Surgical History:  Procedure Laterality Date  . ABDOMINAL HYSTERECTOMY     ovaries left in place  . APPENDECTOMY    . Back Surgeries    .  BREAST REDUCTION SURGERY     3/99  . CARDIAC CATHETERIZATION    . cataracts Bilateral   . CERVICAL SPINE SURGERY    . ESOPHAGEAL MANOMETRY N/A 08/02/2015   Procedure: ESOPHAGEAL MANOMETRY (EM);  Surgeon: Josefine Class, MD;  Location: [facility] ENDOSCOPY;  Service: Endoscopy;  Laterality: N/A;  . ESOPHAGOGASTRODUODENOSCOPY N/A 02/27/2015   Procedure: ESOPHAGOGASTRODUODENOSCOPY (EGD);  Surgeon: Hulen Luster, MD;  [facility] ENDOSCOPY;  Service: Gastroenterology;  Laterality: N/A;  . EXCISIONAL HEMORRHOIDECTOMY    . HEMORRHOID SURGERY    . HIP SURGERY  2013   Right hip surgery  . KNEE ARTHROSCOPY     left and right  . REDUCTION MAMMAPLASTY Bilateral YRS AGO  . REPLACEMENT TOTAL KNEE Bilateral   . rotator cuff surgery     blilateral  . TONSILECTOMY/ADENOIDECTOMY WITH MYRINGOTOMY      SOCIAL HISTORY: Social History   Social History  . Marital status: Married    Spouse name: N/A  . Number of children: 2  . Years of education: N/A   Occupational History  . Not on file.   Social History Main Topics  . Smoking status: Never Smoker  . Smokeless tobacco: Never Used  . Alcohol use No  . Drug use: No  . Sexual activity: Not Currently   Other Topics Concern  .  Not on file   Social History Narrative  . No narrative on file    FAMILY HISTORY: Family History  Problem Relation Age of Onset  . Heart disease Mother   . Stroke Mother   . Hypertension Mother   . Heart disease Father        myocardial infarction age 45  . Breast cancer Neg Hx     ALLERGIES:  is allergic to lyrica [pregabalin]; atarax [hydroxyzine]; dicyclomine; hydroxyzine hcl; levaquin [levofloxacin]; nucynta er [tapentadol hcl er]; oxybutynin; zoloft [sertraline hcl]; biaxin [clarithromycin]; sertraline; sulfa antibiotics; tape; and tapentadol.  MEDICATIONS:  Current Outpatient Prescriptions  Medication Sig Dispense Refill  . aluminum-magnesium hydroxide-simethicone (MAALOX) [phone] MG/5ML SUSP Take by  mouth.    . AMITIZA 24 MCG capsule Take 25 mg by mouth 2 (two) times daily.    Marland Kitchen amLODipine (NORVASC) 10 MG tablet TAKE 1 TABLET BY MOUTH EVERY DAY. 90 tablet 1  . amoxicillin (AMOXIL) 250 MG capsule Take 250 mg by mouth 3 (three) times daily.    Marland Kitchen atorvastatin (LIPITOR) 20 MG tablet TAKE 1 TABLET BY MOUTH EVERY NIGHT AT BEDTIME. 90 tablet 1  . benazepril (LOTENSIN) 40 MG tablet TAKE 1 TABLET BY MOUTH EVERY DAY. 90 tablet 1  . Calcium-Magnesium-Vitamin D 600-40-500 MG-MG-UNIT TB24 Take 630 mg by mouth 4 (four) times daily.    [name] Sodium 30-100 MG CAPS Take by mouth.    . cephALEXin (KEFLEX) 250 MG capsule     . Cholecalciferol (VITAMIN D3) 1000 UNITS CAPS Take by mouth.    . citalopram (CELEXA) 20 MG tablet     . DULoxetine (CYMBALTA) 60 MG capsule TAKE 1 CAPSULE BY MOUTH EVERY DAY. 30 capsule 3  . furosemide (LASIX) 20 MG tablet TAKE 1 TABLET BY MOUTH EVERY DAY AS NEEDED. 90 tablet 1  . gabapentin (NEURONTIN) 300 MG capsule Take 300 mg by mouth 3 (three) times daily.     . hydrochlorothiazide (HYDRODIURIL) 25 MG tablet TAKE ONE (1) TABLET EACH DAY 90 tablet 1  . HYDROcodone-acetaminophen (NORCO) 10-325 MG per tablet Take 1 tablet by mouth 3 (three) times daily.     . Inulin (FIBER CHOICE PO) Take 2 tablets by mouth daily. Chew tablets    . magnesium oxide (MAG-OX) 400 MG tablet Take 1 tablet (400 mg total) by mouth daily. 30 tablet 1  . metoprolol succinate (TOPROL-XL) 100 MG 24 hr tablet TALE 1 TABLET BY MOUTH TWICE A DAY WITH OR IMMEDIATELY FOLLOWING A MEAL 180 tablet 2  . morphine (MS CONTIN) 30 MG 12 hr tablet Take 30 mg by mouth daily. Takes around midday    . morphine (MS CONTIN) 60 MG 12 hr tablet Take 60 mg by mouth 2 (two) times daily.     . mupirocin ointment (BACTROBAN) 2 % Apply to affected area on back bid 22 g 0  . nystatin cream (MYCOSTATIN) APPLY TO AFFECTED AREA TWICE A DAY 30 g 1  . pantoprazole (PROTONIX) 40 MG tablet TAKE ONE TABLET TWICE DAILY 60  tablet 3  . polyethylene glycol powder (GLYCOLAX/MIRALAX) powder MIX 17 GRAMS AS MARKED IN BOTTLE TOP IN 8 OUNCES OF WATER MIX AND DRINK ONCE A DAY AS DIRECTED 527 g 3  . Probiotic Product (ALIGN PO) Take by mouth daily.    . ranitidine (ZANTAC) 150 MG tablet Take 150 mg by mouth 2 (two) times daily.    . sodium chloride (OCEAN) 0.65 % nasal spray Place 1 spray into the nose as needed.    Marland Kitchen  SYNTHROID 112 MCG tablet TAKE ONE TABLET BY MOUTH DAILY. TAKE ON AN EMPTY STOMACH WITH A GLASSOF WATER 30-60 MINUTES BEFORE BREAKFAST 90 tablet 1  . triamcinolone cream (KENALOG) 0.1 %     . fluticasone (VERAMYST) 27.5 MCG/SPRAY nasal spray Place 1 spray into the nose daily. 10 g 0   No current facility-administered medications for this visit.       Marland Kitchen  PHYSICAL EXAMINATION: ECOG PERFORMANCE STATUS: 0 - Asymptomatic  Vitals:   03/26/17 1345  BP: 130/73  Pulse: (!) 54  Resp: 16  Temp: 98.4 F (36.9 C)   There were no vitals filed for this visit.  GENERAL: Well-nourished well-developed; Alert, no distress and comfortable.  Obese. With family. A wheelchair. EYES: no pallor or icterus OROPHARYNX: no thrush or ulceration.   NECK: supple, no masses felt LYMPH:  no palpable lymphadenopathy in the cervical, axillary or inguinal regions LUNGS: clear to auscultation and  No wheeze or crackles HEART/CVS: regular rate & rhythm and no murmurs; No lower extremity edema ABDOMEN: abdomen soft, non-tender and normal bowel sounds Musculoskeletal:no cyanosis of digits and no clubbing  PSYCH: alert & oriented x 3 with fluent speech NEURO: no focal motor/sensory deficits SKIN:  no rashes or significant lesions  LABORATORY DATA:  I have reviewed the data as listed Lab Results  Component Value Date   WBC 7.0 03/26/2017   HGB 10.3 (L) 03/26/2017   HCT 30.2 (L) 03/26/2017   MCV 94.8 03/26/2017   PLT 188 03/26/2017    Recent Labs  05/13/16 1435 07/30/16 0955 08/07/16 1553 11/26/16 1054 03/26/17 1300   NA 137 141 138 141 135  K 3.7 4.7 3.6 4.3 3.9  CL 99 100 98* 100 101  CO2 34* 35* 32 36* 28  GLUCOSE 94 89 98 88 98  BUN 15 21 21* 17 16  CREATININE 0.70 0.84 0.94 0.76 0.78  CALCIUM 9.4 10.0 9.3 9.3 8.9  GFRNONAA  --   --  55*  --  >60  GFRAA  --   --  >60  --  >60  PROT 7.0 6.9 7.2 6.6  --   ALBUMIN 4.0 4.2 4.0 4.0  --   AST _0 --   ALT _1 --   ALKPHOS 78 91 87 93  --   BILITOT 0.3 0.4 <0.1* 0.4  --   BILIDIR 0.1 0.1  --  0.1  --     RADIOGRAPHIC STUDIES: I have personally reviewed the radiological images as listed and agreed with the findings in the report. Dg Bone Density  Result Date: 03/18/2017 EXAM: DUAL X-RAY ABSORPTIOMETRY (DXA) FOR BONE MINERAL DENSITY IMPRESSION: Dear Dr. Nicki Reaper, Your patient Kimberly Roy completed a BMD test on 03/18/2017 using the [place] (analysis version: 14.10) manufactured by [employer]. The following summarizes the results of our evaluation. PATIENT BIOGRAPHICAL: Name: Kimberly, Roy Patient ID: [id number] Birth Date: 22-Feb-1931 Height: 63.0 in. Gender: Female Exam Date: 03/18/2017 Weight: 209.0 lbs. Indications: Advanced Age, bilateral knee replacements, Caucasian, COPD, Height Loss, History of Fracture (Adult), History of Spinal Surgery, Hysterectomy, neuropathy, Postmenopausal, right hip replacement Fractures: Right hip Treatments: CALCIUM VIT D, gabapentin, lipitor, Synthroid, Vitamin D, zantac ASSESSMENT: The BMD measured at Femur Neck is 0.662 g/cm2 with a T-score of -2.7. This patient is considered osteoporotic according to [place] [facility]) criteria. Lumbar spine was not utilized due to advanced degenerative changes/scoliosis. Righ femur was excluded due to surgical hardware.  Site Region Measured Measured WHO Young Adult BMD Date       Age      Classification T-score Left Femur Neck 03/18/2017 84.0 Osteoporosis -2.7 0.662 g/cm2 Left Forearm Radius 33% 03/18/2017 84.0 Normal -0.9 0.797 g/cm2 World Health  Organization [facility]) criteria for post-menopausal, Caucasian Women: Normal:       T-score at or above -1 SD Osteopenia:   T-score between -1 and -2.5 SD Osteoporosis: T-score at or below -2.5 SD RECOMMENDATIONS: [place] recommends that FDA-approved medical therapies be considered in postmenopausal women and men age 51 or older with a: 1. Hip or vertebral (clinical or morphometric) fracture. 2. T-score of < -2.5 at the spine or hip. 3. Ten-year fracture probability by FRAX of 3% or greater for hip fracture or 20% or greater for major osteoporotic fracture. All treatment decisions require clinical judgment and consideration of individual patient factors, including patient preferences, co-morbidities, previous drug use, risk factors not captured in the FRAX model (e.g. falls, vitamin D deficiency, increased bone turnover, interval significant decline in bone density) and possible under - or over-estimation of fracture risk by FRAX. All patients should ensure an adequate intake of dietary calcium (1200 mg/d) and vitamin D (800 IU daily) unless contraindicated. FOLLOW-UP: People with diagnosed cases of osteoporosis or at high risk for fracture should have regular bone mineral density tests. For patients eligible for Medicare, routine testing is allowed once every 2 years. The testing frequency can be increased to one year for patients who have rapidly progressing disease, those who are receiving or discontinuing medical therapy to restore bone mass, or have additional risk factors. I have reviewed this report, and agree with the above findings. [facility] Radiology Electronically Signed   By: Lowella Grip III M.D.   On: 03/18/2017 16:29    ASSESSMENT & PLAN:   Anemia due to blood loss, chronic Chronic anemia unclear etiology.  Hb10.3/ trending down; check Iron studies today; if low recommend IV iron weekly x4. Not very symptomatic. Discussed the potential and infusion reactions with the  patient. Patient has intolerance to by mouth iron. I   # UA/ history of chronic interstitial cystitis. No recent flares.   # follow up in 6 months/labs- 1 week prior; will call re: labs/IV iron infusion.     Cammie Sickle, MD 03/26/2017 3:25 PM

## 2017-03-26 NOTE — Assessment & Plan Note (Addendum)
Chronic anemia unclear etiology.  Hb10.3/ trending down; check Iron studies today; if low recommend IV iron weekly x4. Not very symptomatic. Discussed the potential and infusion reactions with the patient. Patient has intolerance to by mouth iron. I   # UA/ history of chronic interstitial cystitis. No recent flares.   # follow up in 6 months/labs- 1 week prior; will call re: labs/IV iron infusion.

## 2017-03-27 ENCOUNTER — Encounter: Payer: Self-pay | Admitting: *Deleted

## 2017-03-30 DIAGNOSIS — J449 Chronic obstructive pulmonary disease, unspecified: Secondary | ICD-10-CM | POA: Diagnosis not present

## 2017-03-31 ENCOUNTER — Encounter (INDEPENDENT_AMBULATORY_CARE_PROVIDER_SITE_OTHER): Payer: Self-pay | Admitting: Vascular Surgery

## 2017-04-10 ENCOUNTER — Telehealth (INDEPENDENT_AMBULATORY_CARE_PROVIDER_SITE_OTHER): Payer: Self-pay

## 2017-04-10 NOTE — Telephone Encounter (Signed)
Alexa from [employer] called stating that the patient is saying her legs are doing fine for now so she doesn't believe she needs the pump. So they will keep her account open for a month before closing it if she changes her mind regarding getting the pump.

## 2017-04-14 DIAGNOSIS — F330 Major depressive disorder, recurrent, mild: Secondary | ICD-10-CM | POA: Diagnosis not present

## 2017-04-14 DIAGNOSIS — F5105 Insomnia due to other mental disorder: Secondary | ICD-10-CM | POA: Diagnosis not present

## 2017-04-14 DIAGNOSIS — F419 Anxiety disorder, unspecified: Secondary | ICD-10-CM | POA: Diagnosis not present

## 2017-04-16 DIAGNOSIS — M419 Scoliosis, unspecified: Secondary | ICD-10-CM | POA: Diagnosis not present

## 2017-04-16 DIAGNOSIS — T85111D Breakdown (mechanical) of implanted electronic neurostimulator (electrode) of peripheral nerve, subsequent encounter: Secondary | ICD-10-CM | POA: Diagnosis not present

## 2017-04-16 DIAGNOSIS — M48062 Spinal stenosis, lumbar region with neurogenic claudication: Secondary | ICD-10-CM | POA: Diagnosis not present

## 2017-04-30 DIAGNOSIS — J449 Chronic obstructive pulmonary disease, unspecified: Secondary | ICD-10-CM | POA: Diagnosis not present

## 2017-05-01 DIAGNOSIS — I890 Lymphedema, not elsewhere classified: Secondary | ICD-10-CM | POA: Diagnosis not present

## 2017-05-05 ENCOUNTER — Encounter: Payer: Self-pay | Admitting: Internal Medicine

## 2017-05-05 DIAGNOSIS — R21 Rash and other nonspecific skin eruption: Secondary | ICD-10-CM

## 2017-05-06 NOTE — Telephone Encounter (Signed)
If she is having persistent problems despite nystatin, she needs dermatology evaluation.  I can place an order for the referral if she is agreeable.  Let me know who she prefers to see.

## 2017-05-07 DIAGNOSIS — Z9889 Other specified postprocedural states: Secondary | ICD-10-CM | POA: Diagnosis not present

## 2017-05-07 DIAGNOSIS — I890 Lymphedema, not elsewhere classified: Secondary | ICD-10-CM | POA: Diagnosis not present

## 2017-05-07 DIAGNOSIS — Z01810 Encounter for preprocedural cardiovascular examination: Secondary | ICD-10-CM | POA: Diagnosis not present

## 2017-05-07 DIAGNOSIS — G4733 Obstructive sleep apnea (adult) (pediatric): Secondary | ICD-10-CM | POA: Diagnosis not present

## 2017-05-07 DIAGNOSIS — I10 Essential (primary) hypertension: Secondary | ICD-10-CM | POA: Diagnosis not present

## 2017-05-07 DIAGNOSIS — I272 Pulmonary hypertension, unspecified: Secondary | ICD-10-CM | POA: Diagnosis not present

## 2017-05-07 DIAGNOSIS — R079 Chest pain, unspecified: Secondary | ICD-10-CM | POA: Diagnosis not present

## 2017-05-07 DIAGNOSIS — E7800 Pure hypercholesterolemia, unspecified: Secondary | ICD-10-CM | POA: Diagnosis not present

## 2017-05-08 NOTE — Telephone Encounter (Signed)
See her my chart message.  Needs dermatology appt asap.  See her dates regarding conflicts.

## 2017-05-09 ENCOUNTER — Other Ambulatory Visit: Payer: PPO

## 2017-05-12 ENCOUNTER — Other Ambulatory Visit (INDEPENDENT_AMBULATORY_CARE_PROVIDER_SITE_OTHER): Payer: PPO

## 2017-05-12 DIAGNOSIS — I10 Essential (primary) hypertension: Secondary | ICD-10-CM

## 2017-05-12 DIAGNOSIS — T85111D Breakdown (mechanical) of implanted electronic neurostimulator (electrode) of peripheral nerve, subsequent encounter: Secondary | ICD-10-CM | POA: Diagnosis not present

## 2017-05-12 DIAGNOSIS — M48062 Spinal stenosis, lumbar region with neurogenic claudication: Secondary | ICD-10-CM | POA: Diagnosis not present

## 2017-05-12 DIAGNOSIS — Z01818 Encounter for other preprocedural examination: Secondary | ICD-10-CM | POA: Diagnosis not present

## 2017-05-12 DIAGNOSIS — E7800 Pure hypercholesterolemia, unspecified: Secondary | ICD-10-CM

## 2017-05-12 LAB — BASIC METABOLIC PANEL
BUN: 16 mg/dL (ref 6–23)
CALCIUM: 9.7 mg/dL (ref 8.4–10.5)
CO2: 32 meq/L (ref 19–32)
CREATININE: 0.81 mg/dL (ref 0.40–1.20)
Chloride: 101 mEq/L (ref 96–112)
GFR: 71.56 mL/min (ref >60.00)
Glucose, Bld: 90 mg/dL (ref 70–99)
Potassium: 4.6 mEq/L (ref 3.5–5.1)
SODIUM: 141 meq/L (ref 135–145)

## 2017-05-12 LAB — LIPID PANEL
CHOL/HDL RATIO: 3
Cholesterol: 108 mg/dL (ref 0–200)
HDL: 35.2 mg/dL — AB (ref >39.00)
LDL Cholesterol: 55 mg/dL (ref 0–99)
NONHDL: 72.94
Triglycerides: 88 mg/dL (ref 0.0–149.0)
VLDL: 17.6 mg/dL (ref 0.0–40.0)

## 2017-05-12 LAB — HEPATIC FUNCTION PANEL
ALK PHOS: 78 U/L (ref 39–117)
ALT: 8 U/L (ref 0–35)
AST: 15 U/L (ref 0–37)
Albumin: 4.1 g/dL (ref 3.5–5.2)
BILIRUBIN DIRECT: 0.2 mg/dL (ref 0.0–0.3)
TOTAL PROTEIN: 6.9 g/dL (ref 6.0–8.3)
Total Bilirubin: 0.5 mg/dL (ref 0.2–1.2)

## 2017-05-13 ENCOUNTER — Telehealth: Payer: Self-pay | Admitting: *Deleted

## 2017-05-13 ENCOUNTER — Ambulatory Visit (INDEPENDENT_AMBULATORY_CARE_PROVIDER_SITE_OTHER): Payer: PPO | Admitting: Internal Medicine

## 2017-05-13 ENCOUNTER — Encounter: Payer: Self-pay | Admitting: Internal Medicine

## 2017-05-13 ENCOUNTER — Ambulatory Visit: Payer: PPO

## 2017-05-13 ENCOUNTER — Ambulatory Visit (INDEPENDENT_AMBULATORY_CARE_PROVIDER_SITE_OTHER): Payer: PPO | Admitting: Vascular Surgery

## 2017-05-13 VITALS — BP 144/70 | HR 67 | Temp 98.6°F | Resp 14 | Ht 63.0 in | Wt 208.0 lb

## 2017-05-13 DIAGNOSIS — M5440 Lumbago with sciatica, unspecified side: Secondary | ICD-10-CM

## 2017-05-13 DIAGNOSIS — Z01818 Encounter for other preprocedural examination: Secondary | ICD-10-CM | POA: Diagnosis not present

## 2017-05-13 DIAGNOSIS — D500 Iron deficiency anemia secondary to blood loss (chronic): Secondary | ICD-10-CM

## 2017-05-13 DIAGNOSIS — I272 Pulmonary hypertension, unspecified: Secondary | ICD-10-CM

## 2017-05-13 DIAGNOSIS — Z0000 Encounter for general adult medical examination without abnormal findings: Secondary | ICD-10-CM | POA: Diagnosis not present

## 2017-05-13 DIAGNOSIS — G4733 Obstructive sleep apnea (adult) (pediatric): Secondary | ICD-10-CM | POA: Diagnosis not present

## 2017-05-13 DIAGNOSIS — Z6836 Body mass index (BMI) 36.0-36.9, adult: Secondary | ICD-10-CM | POA: Diagnosis not present

## 2017-05-13 DIAGNOSIS — M7989 Other specified soft tissue disorders: Secondary | ICD-10-CM

## 2017-05-13 DIAGNOSIS — E7800 Pure hypercholesterolemia, unspecified: Secondary | ICD-10-CM | POA: Diagnosis not present

## 2017-05-13 DIAGNOSIS — I509 Heart failure, unspecified: Secondary | ICD-10-CM

## 2017-05-13 DIAGNOSIS — M25511 Pain in right shoulder: Secondary | ICD-10-CM | POA: Diagnosis not present

## 2017-05-13 DIAGNOSIS — M810 Age-related osteoporosis without current pathological fracture: Secondary | ICD-10-CM | POA: Diagnosis not present

## 2017-05-13 DIAGNOSIS — K219 Gastro-esophageal reflux disease without esophagitis: Secondary | ICD-10-CM

## 2017-05-13 DIAGNOSIS — I10 Essential (primary) hypertension: Secondary | ICD-10-CM | POA: Diagnosis not present

## 2017-05-13 DIAGNOSIS — E039 Hypothyroidism, unspecified: Secondary | ICD-10-CM

## 2017-05-13 DIAGNOSIS — F439 Reaction to severe stress, unspecified: Secondary | ICD-10-CM

## 2017-05-13 DIAGNOSIS — G8929 Other chronic pain: Secondary | ICD-10-CM

## 2017-05-13 DIAGNOSIS — L723 Sebaceous cyst: Secondary | ICD-10-CM

## 2017-05-13 MED ORDER — NYSTATIN 100000 UNIT/GM EX POWD: Freq: Two times a day (BID) | CUTANEOUS | 0 refills | Status: DC

## 2017-05-13 NOTE — Progress Notes (Signed)
Patient ID: Kimberly Roy, female   DOB: [birth year], 83 y.o.   MRN: [id number]   Subjective:    Patient ID: Kimberly Roy, female    DOB: [birth year], 83 y.o.   MRN: [id number]  HPI  Patient with past history of hypertension, hypercholesterolemia, hypothyroidism and OA.  She comes in today to follow up on these issues as well as for a complete physical exam.  She has been having problems with increased right shoulder pain.  States increased pain with attempts at abduction, especially at or above 90 degrees.  Also notices increased pain with trying to close a sliding van door.  Also has a sebaceous cyst on her back.  Also with rash on her arm.  Has appt with dermatology.  Plans to have evaluated.  No chest pain.  Breathing stable.  Has f/u planned with Dr Raul Del tomorrow.  No abdominal pain.  Bowels moving.  Wearing compression hose to help with her lower extremity swelling.  Still seeing Dr Nicolasa Ducking.  Stable.  Discussed osteoporosis.  Discussed reclast.  Wants to hold at this point, until can get some of the above sorted through.  Panning to have neurostimulator placed.  Just saw cardiology 05/07/17.  See note for pre op recs.     Past Medical History:  Diagnosis Date  . Anemia   . Anxiety   . Chest pain   . CHF (congestive heart failure) ([place])   . Constipation   . DDD (degenerative disc disease), cervical   . Depression   . DVT (deep venous thrombosis) ([place])   . Dysphonia   . Dyspnea   . Fatty liver   . Fatty liver   . Headache   . Hyperlipidemia   . Hyperpiesia   . Hypertension   . Hypothyroidism   . Interstitial cystitis   . Left ventricular dysfunction   . Lymphedema   . Nephrolithiasis   . Obstructive sleep apnea   . Osteoarthritis    knees/cervical and lumbar spine  . Pulmonary hypertension ([place])   . Pulmonary nodules    followed by Dr Raul Del  . Pure hypercholesterolemia   . Renal cyst    right   Past Surgical History:  Procedure Laterality Date  . ABDOMINAL HYSTERECTOMY     ovaries left in place  . APPENDECTOMY    . Back Surgeries    . BREAST REDUCTION SURGERY     3/99  . CARDIAC CATHETERIZATION    . cataracts Bilateral   . CERVICAL SPINE SURGERY    . ESOPHAGEAL MANOMETRY N/A 08/02/2015   Procedure: ESOPHAGEAL MANOMETRY (EM);  Surgeon: Josefine Class, MD;  Location[facility] ENDOSCOPY;  Service: Endoscopy;  Laterality: N/A;  . ESOPHAGOGASTRODUODENOSCOPY N/A 02/27/2015   Procedure: ESOPHAGOGASTRODUODENOSCOPY (EGD);  Surgeon: Hulen Luster, MD;  Location: [facility] ENDOSCOPY;  Service: Gastroenterology;  Laterality: N/A;  . EXCISIONAL HEMORRHOIDECTOMY    . HEMORRHOID SURGERY    . HIP SURGERY  2013   Right hip surgery  . KNEE ARTHROSCOPY     left and right  . REDUCTION MAMMAPLASTY Bilateral YRS AGO  . REPLACEMENT TOTAL KNEE Bilateral   . rotator cuff surgery     blilateral  . TONSILECTOMY/ADENOIDECTOMY WITH MYRINGOTOMY     Family History  Problem Relation Age of Onset  . Heart disease Mother   . Stroke Mother   . Hypertension Mother   . Heart disease Father        myocardial infarction age 41  . Breast cancer Neg Hx  Social History   Social History  . Marital status: Married    Spouse name: N/A  . Number of children: 2  . Years of education: N/A   Social History Main Topics  . Smoking status: Never Smoker  . Smokeless tobacco: Never Used  . Alcohol use No  . Drug use: No  . Sexual activity: Not Currently   Other Topics Concern  . None   Social History Narrative  . None    Outpatient Encounter Prescriptions as of 05/13/2017  Medication Sig  . aluminum-magnesium hydroxide-simethicone (MAALOX) 347-583-07 MG/5ML SUSP Take by mouth.  . AMITIZA 24 MCG capsule Take 25 mg by mouth 2 (two) times daily.  Marland Kitchen amLODipine (NORVASC) 10 MG tablet TAKE 1 TABLET BY MOUTH EVERY DAY.  Marland Kitchen amoxicillin (AMOXIL) 250 MG capsule Take 250 mg by mouth 3 (three) times daily.  Marland Kitchen atorvastatin (LIPITOR) 20 MG tablet TAKE 1 TABLET BY MOUTH EVERY NIGHT AT BEDTIME.  .  benazepril (LOTENSIN) 40 MG tablet TAKE 1 TABLET BY MOUTH EVERY DAY.  . Calcium-Magnesium-Vitamin D 600-40-500 MG-MG-UNIT TB24 Take 630 mg by mouth 4 (four) times daily.  [name] Sodium 30-100 MG CAPS Take by mouth.  . cephALEXin (KEFLEX) 250 MG capsule   . Cholecalciferol (VITAMIN D3) 1000 UNITS CAPS Take by mouth.  . citalopram (CELEXA) 20 MG tablet   . DULoxetine (CYMBALTA) 60 MG capsule TAKE 1 CAPSULE BY MOUTH EVERY DAY.  Marland Kitchen FLUoxetine (PROZAC) 10 MG capsule Take 10 mg by mouth daily.  . furosemide (LASIX) 20 MG tablet TAKE 1 TABLET BY MOUTH EVERY DAY AS NEEDED.  Marland Kitchen gabapentin (NEURONTIN) 300 MG capsule Take 300 mg by mouth 3 (three) times daily.   . hydrochlorothiazide (HYDRODIURIL) 25 MG tablet TAKE ONE (1) TABLET EACH DAY  . HYDROcodone-acetaminophen (NORCO) 10-325 MG per tablet Take 1 tablet by mouth 3 (three) times daily.   . Inulin (FIBER CHOICE PO) Take 2 tablets by mouth daily. Chew tablets  . magnesium oxide (MAG-OX) 400 MG tablet Take 1 tablet (400 mg total) by mouth daily.  . metoprolol succinate (TOPROL-XL) 100 MG 24 hr tablet TALE 1 TABLET BY MOUTH TWICE A DAY WITH OR IMMEDIATELY FOLLOWING A MEAL  . morphine (MS CONTIN) 30 MG 12 hr tablet Take 30 mg by mouth daily. Takes around midday  . morphine (MS CONTIN) 60 MG 12 hr tablet Take 60 mg by mouth 2 (two) times daily.   . mupirocin ointment (BACTROBAN) 2 % Apply to affected area on back bid  . nystatin cream (MYCOSTATIN) APPLY TO AFFECTED AREA TWICE A DAY  . pantoprazole (PROTONIX) 40 MG tablet TAKE ONE TABLET TWICE DAILY  . polyethylene glycol powder (GLYCOLAX/MIRALAX) powder MIX 17 GRAMS AS MARKED IN BOTTLE TOP IN 8 OUNCES OF WATER MIX AND DRINK ONCE A DAY AS DIRECTED  . Probiotic Product (ALIGN PO) Take by mouth daily.  . ranitidine (ZANTAC) 150 MG tablet Take 150 mg by mouth 2 (two) times daily.  . sodium chloride (OCEAN) 0.65 % nasal spray Place 1 spray into the nose as needed.  Marland Kitchen SYNTHROID 112 MCG tablet  TAKE ONE TABLET BY MOUTH DAILY. TAKE ON AN EMPTY STOMACH WITH A GLASSOF WATER 30-60 MINUTES BEFORE BREAKFAST  . triamcinolone cream (KENALOG) 0.1 %   . fluticasone (VERAMYST) 27.5 MCG/SPRAY nasal spray Place 1 spray into the nose daily.  Marland Kitchen nystatin (NYSTATIN) powder Apply topically 2 (two) times daily.   No facility-administered encounter medications on file as of 05/13/2017.  Review of Systems  Constitutional: Negative for appetite change and unexpected weight change.  HENT: Negative for congestion and sinus pressure.   Eyes: Negative for pain and visual disturbance.  Respiratory: Negative for cough, chest tightness and shortness of breath.   Cardiovascular: Negative for chest pain and palpitations.       Swelling relatively stable - compression hose.    Gastrointestinal: Negative for abdominal pain, diarrhea, nausea and vomiting.  Genitourinary: Negative for difficulty urinating and dysuria.  Musculoskeletal: Positive for back pain. Negative for joint swelling.  Skin: Negative for color change and rash.  Neurological: Negative for dizziness and headaches.  Hematological: Negative for adenopathy. Does not bruise/bleed easily.  Psychiatric/Behavioral: Negative for agitation and dysphoric mood.       Objective:    Physical Exam  Constitutional: She is oriented to person, place, and time. She appears well-developed and well-nourished. No distress.  HENT:  Nose: Nose normal.  Mouth/Throat: Oropharynx is clear and moist.  Eyes: Right eye exhibits no discharge. Left eye exhibits no discharge. No scleral icterus.  Neck: Neck supple. No thyromegaly present.  Cardiovascular: Normal rate and regular rhythm.   Pulmonary/Chest: Breath sounds normal. No accessory muscle usage. No tachypnea. No respiratory distress. She has no decreased breath sounds. She has no wheezes. She has no rhonchi. Right breast exhibits no inverted nipple, no mass, no nipple discharge and no tenderness (no axillary  adenopathy). Left breast exhibits no inverted nipple, no mass, no nipple discharge and no tenderness (no axilarry adenopathy).  Abdominal: Soft. Bowel sounds are normal. There is no tenderness.  Musculoskeletal: She exhibits no tenderness.  No increased swelling.    Lymphadenopathy:    She has no cervical adenopathy.  Neurological: She is alert and oriented to person, place, and time.  Skin: Skin is warm. No rash noted. No erythema.  Psychiatric: She has a normal mood and affect. Her behavior is normal.    BP (!) 144/70 (BP Location: Left Arm, Patient Position: Sitting, Cuff Size: Large)   Pulse 67   Temp 98.6 F (37 C)   Resp 14   Ht 5' 3" (1.6 m)   Wt 208 lb (94.3 kg)   SpO2 94%   BMI 36.85 kg/m  Wt Readings from Last 3 Encounters:  05/13/17 208 lb (94.3 kg)  02/10/17 209 lb (94.8 kg)  01/31/17 208 lb (94.3 kg)     Lab Results  Component Value Date   WBC 7.0 03/26/2017   HGB 10.3 (L) 03/26/2017   HCT 30.2 (L) 03/26/2017   PLT 188 03/26/2017   GLUCOSE 90 05/12/2017   CHOL 108 05/12/2017   TRIG 88.0 05/12/2017   HDL 35.20 (L) 05/12/2017   LDLCALC 55 05/12/2017   ALT 8 05/12/2017   AST 15 05/12/2017   NA 141 05/12/2017   K 4.6 05/12/2017   CL 101 05/12/2017   CREATININE 0.81 05/12/2017   BUN 16 05/12/2017   CO2 32 05/12/2017   TSH 1.94 11/26/2016   INR 1.0 12/08/2013   HGBA1C 6.0 10/19/2014    Dg Bone Density  Result Date: 03/18/2017 EXAM: DUAL X-RAY ABSORPTIOMETRY (DXA) FOR BONE MINERAL DENSITY IMPRESSION: Dear Dr. Nicki Reaper, Your patient Anabela Crayton completed a BMD test on 03/18/2017 using the [place] (analysis version: 14.10) manufactured by [employer]. The following summarizes the results of our evaluation. PATIENT BIOGRAPHICAL: Name: Kimberly Roy, Kimberly Roy Patient ID: [id number] Birth Date: [birth year] Height: 63.0 in. Gender: Female Exam Date: 03/18/2017 Weight: 209.0 lbs. Indications: Advanced Age, bilateral  knee replacements, Caucasian, COPD, Height Loss,  History of Fracture (Adult), History of Spinal Surgery, Hysterectomy, neuropathy, Postmenopausal, right hip replacement Fractures: Right hip Treatments: CALCIUM VIT D, gabapentin, lipitor, Synthroid, Vitamin D, zantac ASSESSMENT: The BMD measured at Femur Neck is 0.662 g/cm2 with a T-score of -2.7. This patient is considered osteoporotic according to [facility]) criteria. Lumbar spine was not utilized due to advanced degenerative changes/scoliosis. Righ femur was excluded due to surgical hardware. Site Region Measured Measured WHO Young Adult BMD Date       Age      Classification T-score Left Femur Neck 03/18/2017 84.0 Osteoporosis -2.7 0.662 g/cm2 Left Forearm Radius 33% 03/18/2017 84.0 Normal -0.9 0.797 g/cm2 World Health Organization [facility]) criteria for post-menopausal, Caucasian Women: Normal:       T-score at or above -1 SD Osteopenia:   T-score between -1 and -2.5 SD Osteoporosis: T-score at or below -2.5 SD RECOMMENDATIONS: [place] recommends that FDA-approved medical therapies be considered in postmenopausal women and men age 38 or older with a: 1. Hip or vertebral (clinical or morphometric) fracture. 2. T-score of < -2.5 at the spine or hip. 3. Ten-year fracture probability by FRAX of 3% or greater for hip fracture or 20% or greater for major osteoporotic fracture. All treatment decisions require clinical judgment and consideration of individual patient factors, including patient preferences, co-morbidities, previous drug use, risk factors not captured in the FRAX model (e.g. falls, vitamin D deficiency, increased bone turnover, interval significant decline in bone density) and possible under - or over-estimation of fracture risk by FRAX. All patients should ensure an adequate intake of dietary calcium (1200 mg/d) and vitamin D (800 IU daily) unless contraindicated. FOLLOW-UP: People with diagnosed cases of osteoporosis or at high risk for fracture should have  regular bone mineral density tests. For patients eligible for Medicare, routine testing is allowed once every 2 years. The testing frequency can be increased to one year for patients who have rapidly progressing disease, those who are receiving or discontinuing medical therapy to restore bone mass, or have additional risk factors. I have reviewed this report, and agree with the above findings. [facility] Radiology Electronically Signed   By: Lowella Grip III M.D.   On: 03/18/2017 16:29       Assessment & Plan:   Problem List Items Addressed This Visit    Anemia due to blood loss, chronic    Seeing hematology.       Back pain    Followed by ortho and pain clinic.  Planning to have neurostimulator placed.  Just saw cardiology.. See their note for pre op recs.        BMI 36.0-36.9,adult    Discussed diet and exercise.  Follow.       CHF (congestive heart failure) ([facility])    No evidence of volume overload on exam.  Follow.        GERD (gastroesophageal reflux disease)    Controlled on protonix.        Health care maintenance    Physical today 05/13/17.  Mammogram 11/08/16 - Birads I.        Hypercholesterolemia    On lipitor.  Low cholesterol diet and exercise.  Follow lipid panel and liver function tests.        Relevant Orders   Hepatic function panel   Lipid panel   Hypertension    Blood pressure has been under reasonable control.  Continue same medication regimen.  Follow pressures.  Follow  metabolic panel.        Relevant Orders   Basic metabolic panel   Hypothyroidism    On thyroid replacement.  Follow tsh.        Leg swelling    Compression hose and leg elevation.  Follow.        OSA (obstructive sleep apnea)    CPAP.       Osteoporosis    Bone density as outlined.  Wants to hold on reclast at this time.  Follow.        Relevant Orders   VITAMIN D 25 Hydroxy (Vit-D Deficiency, Fractures)   Pre-op evaluation    Just saw cardiology.  Note reviewed.  See  note for pre op recs.        Pulmonary hypertension ([place])    Has seen pulmonary and cardiology.        Right shoulder pain - Primary    Persistent.  Check xray.        Stress    Followed by Dr Nicolasa Ducking.  Note reviewed.  Stable.        Other Visit Diagnoses    Sebaceous cyst       Has appt with dermatology.  have them evaluate and also will evaluate arms.         Einar Pheasant, MD

## 2017-05-13 NOTE — Telephone Encounter (Signed)
Pt was unable to have Right shoulder x-ray in office. Pt was in a lot of pain & couldn't stand long. She would like to go to [facility] for x-ray on Monday. I mentioned that she could go right over today & she stated that she was in so much pain she just wanted to go home.   Please reorder xray for [facility].  Thanks

## 2017-05-14 DIAGNOSIS — R0602 Shortness of breath: Secondary | ICD-10-CM | POA: Diagnosis not present

## 2017-05-14 NOTE — Telephone Encounter (Signed)
Order placed for right shoulder xray

## 2017-05-16 ENCOUNTER — Encounter: Payer: Self-pay | Admitting: Internal Medicine

## 2017-05-16 DIAGNOSIS — M25511 Pain in right shoulder: Secondary | ICD-10-CM | POA: Insufficient documentation

## 2017-05-16 DIAGNOSIS — M25519 Pain in unspecified shoulder: Secondary | ICD-10-CM | POA: Insufficient documentation

## 2017-05-16 NOTE — Assessment & Plan Note (Signed)
Physical today 05/13/17.  Mammogram 11/08/16 - Birads I.

## 2017-05-16 NOTE — Assessment & Plan Note (Signed)
Controlled on protonix.

## 2017-05-16 NOTE — Assessment & Plan Note (Signed)
On thyroid replacement.  Follow tsh.

## 2017-05-16 NOTE — Assessment & Plan Note (Signed)
Seeing hematology.   

## 2017-05-16 NOTE — Assessment & Plan Note (Signed)
Just saw cardiology.  Note reviewed.  See note for pre op recs.

## 2017-05-16 NOTE — Assessment & Plan Note (Signed)
On lipitor.  Low cholesterol diet and exercise.  Follow lipid panel and liver function tests.   

## 2017-05-16 NOTE — Assessment & Plan Note (Signed)
Persistent.  Check xray.

## 2017-05-16 NOTE — Assessment & Plan Note (Signed)
Compression hose and leg elevation.  Follow.

## 2017-05-16 NOTE — Assessment & Plan Note (Signed)
Has seen pulmonary and cardiology.

## 2017-05-16 NOTE — Assessment & Plan Note (Signed)
Blood pressure has been under reasonable control.  Continue same medication regimen.  Follow pressures.  Follow metabolic panel.  

## 2017-05-16 NOTE — Assessment & Plan Note (Signed)
CPAP.

## 2017-05-16 NOTE — Assessment & Plan Note (Signed)
Followed by Dr Nicolasa Ducking.  Note reviewed.  Stable.

## 2017-05-16 NOTE — Assessment & Plan Note (Signed)
No evidence of volume overload on exam.  Follow.

## 2017-05-16 NOTE — Assessment & Plan Note (Signed)
Discussed diet and exercise.  Follow.   

## 2017-05-16 NOTE — Assessment & Plan Note (Signed)
Bone density as outlined.  Wants to hold on reclast at this time.  Follow.

## 2017-05-16 NOTE — Assessment & Plan Note (Signed)
Followed by ortho and pain clinic.  Planning to have neurostimulator placed.  Just saw cardiology.. See their note for pre op recs.

## 2017-05-19 DIAGNOSIS — L219 Seborrheic dermatitis, unspecified: Secondary | ICD-10-CM | POA: Diagnosis not present

## 2017-05-19 DIAGNOSIS — L304 Erythema intertrigo: Secondary | ICD-10-CM | POA: Diagnosis not present

## 2017-05-19 DIAGNOSIS — L570 Actinic keratosis: Secondary | ICD-10-CM | POA: Diagnosis not present

## 2017-05-19 DIAGNOSIS — R21 Rash and other nonspecific skin eruption: Secondary | ICD-10-CM | POA: Diagnosis not present

## 2017-05-22 DIAGNOSIS — T85113A Breakdown (mechanical) of implanted electronic neurostimulator, generator, initial encounter: Secondary | ICD-10-CM | POA: Diagnosis not present

## 2017-05-22 DIAGNOSIS — E7800 Pure hypercholesterolemia, unspecified: Secondary | ICD-10-CM | POA: Diagnosis not present

## 2017-05-22 DIAGNOSIS — H579 Unspecified disorder of eye and adnexa: Secondary | ICD-10-CM | POA: Diagnosis not present

## 2017-05-22 DIAGNOSIS — T85111D Breakdown (mechanical) of implanted electronic neurostimulator (electrode) of peripheral nerve, subsequent encounter: Secondary | ICD-10-CM | POA: Diagnosis not present

## 2017-05-22 DIAGNOSIS — T85111A Breakdown (mechanical) of implanted electronic neurostimulator (electrode) of peripheral nerve, initial encounter: Secondary | ICD-10-CM | POA: Diagnosis not present

## 2017-05-22 DIAGNOSIS — I10 Essential (primary) hypertension: Secondary | ICD-10-CM | POA: Diagnosis not present

## 2017-05-22 DIAGNOSIS — K219 Gastro-esophageal reflux disease without esophagitis: Secondary | ICD-10-CM | POA: Diagnosis not present

## 2017-05-22 DIAGNOSIS — M797 Fibromyalgia: Secondary | ICD-10-CM | POA: Diagnosis not present

## 2017-05-22 DIAGNOSIS — E079 Disorder of thyroid, unspecified: Secondary | ICD-10-CM | POA: Diagnosis not present

## 2017-05-22 DIAGNOSIS — D649 Anemia, unspecified: Secondary | ICD-10-CM | POA: Diagnosis not present

## 2017-05-22 DIAGNOSIS — R0681 Apnea, not elsewhere classified: Secondary | ICD-10-CM | POA: Diagnosis not present

## 2017-05-22 DIAGNOSIS — F329 Major depressive disorder, single episode, unspecified: Secondary | ICD-10-CM | POA: Diagnosis not present

## 2017-05-31 DIAGNOSIS — J449 Chronic obstructive pulmonary disease, unspecified: Secondary | ICD-10-CM | POA: Diagnosis not present

## 2017-06-09 DIAGNOSIS — G4733 Obstructive sleep apnea (adult) (pediatric): Secondary | ICD-10-CM | POA: Diagnosis not present

## 2017-06-11 ENCOUNTER — Ambulatory Visit (INDEPENDENT_AMBULATORY_CARE_PROVIDER_SITE_OTHER): Payer: PPO

## 2017-06-11 DIAGNOSIS — Z23 Encounter for immunization: Secondary | ICD-10-CM

## 2017-06-11 DIAGNOSIS — G4733 Obstructive sleep apnea (adult) (pediatric): Secondary | ICD-10-CM | POA: Diagnosis not present

## 2017-06-17 ENCOUNTER — Ambulatory Visit (INDEPENDENT_AMBULATORY_CARE_PROVIDER_SITE_OTHER): Payer: PPO | Admitting: Vascular Surgery

## 2017-06-17 ENCOUNTER — Encounter (INDEPENDENT_AMBULATORY_CARE_PROVIDER_SITE_OTHER): Payer: Self-pay | Admitting: Vascular Surgery

## 2017-06-17 VITALS — BP 132/61 | HR 66 | Resp 16 | Wt 199.6 lb

## 2017-06-17 DIAGNOSIS — I890 Lymphedema, not elsewhere classified: Secondary | ICD-10-CM | POA: Diagnosis not present

## 2017-06-17 DIAGNOSIS — I509 Heart failure, unspecified: Secondary | ICD-10-CM | POA: Diagnosis not present

## 2017-06-17 DIAGNOSIS — E7800 Pure hypercholesterolemia, unspecified: Secondary | ICD-10-CM | POA: Diagnosis not present

## 2017-06-17 NOTE — Patient Instructions (Signed)

## 2017-06-17 NOTE — Progress Notes (Signed)
MRN : [id number]  Kimberly Roy is a 83 y.o. (03-24-1931) female who presents with chief complaint of  Chief Complaint  Patient presents with  . Follow-up    36month .  History of Present Illness: Patient returns today in follow up of leg swelling.  Overall, her leg swelling has done pretty well.  Her husband has been putting on her compression stockings and she has been wearing these daily.  She has gotten the lymphedema pump, but has not yet started using it yet because her swelling has been down.  She has no new ulceration or infection.  She has no new complaints today.  Current Outpatient Prescriptions  Medication Sig Dispense Refill  . aluminum-magnesium hydroxide-simethicone (MAALOX) 2794-997-18MG/5ML SUSP Take by mouth.    . AMITIZA 24 MCG capsule Take 25 mg by mouth 2 (two) times daily.    .Marland KitchenamLODipine (NORVASC) 10 MG tablet TAKE 1 TABLET BY MOUTH EVERY DAY. 90 tablet 1  . atorvastatin (LIPITOR) 20 MG tablet TAKE 1 TABLET BY MOUTH EVERY NIGHT AT BEDTIME. 90 tablet 1  . benazepril (LOTENSIN) 40 MG tablet TAKE 1 TABLET BY MOUTH EVERY DAY. 90 tablet 1  . Calcium-Magnesium-Vitamin D 600-40-500 MG-MG-UNIT TB24 Take 630 mg by mouth 4 (four) times daily.    .Sarajane MarekSodium 30-100 MG CAPS Take by mouth.    . cephALEXin (KEFLEX) 250 MG capsule     . Cholecalciferol (VITAMIN D3) 1000 UNITS CAPS Take by mouth.    . citalopram (CELEXA) 20 MG tablet     . DULoxetine (CYMBALTA) 60 MG capsule TAKE 1 CAPSULE BY MOUTH EVERY DAY. 30 capsule 3  . FLUoxetine (PROZAC) 10 MG capsule Take 10 mg by mouth daily.    . furosemide (LASIX) 20 MG tablet TAKE 1 TABLET BY MOUTH EVERY DAY AS NEEDED. 90 tablet 1  . gabapentin (NEURONTIN) 300 MG capsule Take 300 mg by mouth 3 (three) times daily.     . hydrochlorothiazide (HYDRODIURIL) 25 MG tablet TAKE ONE (1) TABLET EACH DAY 90 tablet 1  . HYDROcodone-acetaminophen (NORCO) 10-325 MG per tablet Take 1 tablet by mouth 3 (three) times daily.     .  Inulin (FIBER CHOICE PO) Take 2 tablets by mouth daily. Chew tablets    . magnesium oxide (MAG-OX) 400 MG tablet Take 1 tablet (400 mg total) by mouth daily. 30 tablet 1  . metoprolol succinate (TOPROL-XL) 100 MG 24 hr tablet TALE 1 TABLET BY MOUTH TWICE A DAY WITH OR IMMEDIATELY FOLLOWING A MEAL 180 tablet 2  . morphine (MS CONTIN) 30 MG 12 hr tablet Take 30 mg by mouth daily. Takes around midday    . morphine (MS CONTIN) 60 MG 12 hr tablet Take 60 mg by mouth 2 (two) times daily.     . mupirocin ointment (BACTROBAN) 2 % Apply to affected area on back bid 22 g 0  . nystatin (NYSTATIN) powder Apply topically 2 (two) times daily. 15 g 0  . nystatin cream (MYCOSTATIN) APPLY TO AFFECTED AREA TWICE A DAY 30 g 1  . pantoprazole (PROTONIX) 40 MG tablet TAKE ONE TABLET TWICE DAILY 60 tablet 3  . polyethylene glycol powder (GLYCOLAX/MIRALAX) powder MIX 17 GRAMS AS MARKED IN BOTTLE TOP IN 8 OUNCES OF WATER MIX AND DRINK ONCE A DAY AS DIRECTED 527 g 3  . Probiotic Product (ALIGN PO) Take by mouth daily.    . ranitidine (ZANTAC) 150 MG tablet Take 150 mg by mouth 2 (two) times  daily.    . sodium chloride (OCEAN) 0.65 % nasal spray Place 1 spray into the nose as needed.    Marland Kitchen SYNTHROID 112 MCG tablet TAKE ONE TABLET BY MOUTH DAILY. TAKE ON AN EMPTY STOMACH WITH A GLASSOF WATER 30-60 MINUTES BEFORE BREAKFAST 90 tablet 1  . triamcinolone cream (KENALOG) 0.1 %     . amoxicillin (AMOXIL) 250 MG capsule Take 250 mg by mouth 3 (three) times daily.    . fluticasone (VERAMYST) 27.5 MCG/SPRAY nasal spray Place 1 spray into the nose daily. 10 g 0   No current facility-administered medications for this visit.     Past Medical History:  Diagnosis Date  . Anemia   . Anxiety   . Chest pain   . CHF (congestive heart failure) ([place])   . Constipation   . DDD (degenerative disc disease), cervical   . Depression   . DVT (deep venous thrombosis) ([place])   . Dysphonia   . Dyspnea   . Fatty liver   . Fatty liver   .  Headache   . Hyperlipidemia   . Hyperpiesia   . Hypertension   . Hypothyroidism   . Interstitial cystitis   . Left ventricular dysfunction   . Lymphedema   . Nephrolithiasis   . Obstructive sleep apnea   . Osteoarthritis    knees/cervical and lumbar spine  . Pulmonary hypertension ([place])   . Pulmonary nodules    followed by Dr Raul Del  . Pure hypercholesterolemia   . Renal cyst    right    Past Surgical History:  Procedure Laterality Date  . ABDOMINAL HYSTERECTOMY     ovaries left in place  . APPENDECTOMY    . Back Surgeries    . BREAST REDUCTION SURGERY     3/99  . CARDIAC CATHETERIZATION    . cataracts Bilateral   . CERVICAL SPINE SURGERY    . ESOPHAGEAL MANOMETRY N/A 08/02/2015   Procedure: ESOPHAGEAL MANOMETRY (EM);  Surgeon: Josefine Class, MD;  Location: [facility] ENDOSCOPY;  Service: Endoscopy;  Laterality: N/A;  . ESOPHAGOGASTRODUODENOSCOPY N/A 02/27/2015   Procedure: ESOPHAGOGASTRODUODENOSCOPY (EGD);  Surgeon: Hulen Luster, MD;  Location[facility] ENDOSCOPY;  Service: Gastroenterology;  Laterality: N/A;  . EXCISIONAL HEMORRHOIDECTOMY    . HEMORRHOID SURGERY    . HIP SURGERY  2013   Right hip surgery  . KNEE ARTHROSCOPY     left and right  . REDUCTION MAMMAPLASTY Bilateral YRS AGO  . REPLACEMENT TOTAL KNEE Bilateral   . rotator cuff surgery     blilateral  . TONSILECTOMY/ADENOIDECTOMY WITH MYRINGOTOMY      Social History Social History  Substance Use Topics  . Smoking status: Never Smoker  . Smokeless tobacco: Never Used  . Alcohol use No    Family History Family History  Problem Relation Age of Onset  . Heart disease Mother   . Stroke Mother   . Hypertension Mother   . Heart disease Father        myocardial infarction age 1  . Breast cancer Neg Hx     Allergies  Allergen Reactions  . Lyrica [Pregabalin] Swelling  . Atarax [Hydroxyzine]     jittery  . Dicyclomine Other (See Comments)    Abdominal bloating   . Hydroxyzine Hcl     jittery  .  Levaquin [Levofloxacin] Swelling  . Nucynta Er [Tapentadol Hcl Er] Other (See Comments)    Severe constipation   . Oxybutynin Other (See Comments)    Blurred vision  .  Zoloft [Sertraline Hcl]     Severe headache  . Biaxin [Clarithromycin] Other (See Comments) and Rash    Pt does not remember Pt does not remember  . Sertraline Nausea And Vomiting    Severe headache Severe headache Other reaction(s): Headache Severe headache  . Sulfa Antibiotics Rash    Pt does not remember  . Tape Rash    [place] - redness  . Tapentadol Other (See Comments) and Rash    _0      REVIEW OF SYSTEMS (Negative unless checked)  Constitutional: _1 Weight loss  _2 Fever  _3 Chills Cardiac: _4 Chest pain   _5 Chest pressure   _6 Palpitations   _7 Shortness of breath when laying flat   _8 Shortness of breath at rest   _9 Shortness of breath with exertion. Vascular:  _10 Pain in legs with walking   _11 Pain in legs at rest   _12 Pain in legs when laying flat   _13 Claudication   _14 Pain in feet when walking  _15 Pain in feet at rest  _16 Pain in feet when laying flat   _17 History of DVT   _18 Phlebitis   _19 Swelling in legs   _20 Varicose veins   _21 Non-healing ulcers Pulmonary:   _22 Uses home oxygen   _23 Productive cough   _24 Hemoptysis   _25 Wheeze  _26 COPD   _27 Asthma Neurologic:  _28 Dizziness  _29 Blackouts   _30 Seizures   _31 History of stroke   _32 History of TIA  _33 Aphasia   _34 Temporary blindness   _35 Dysphagia   _36 Weakness or numbness in arms   _37 Weakness or numbness in legs Musculoskeletal:  _38 Arthritis   _39 Joint swelling   _40 Joint pain   _41 Low back pain Hematologic:  _42 Easy bruising  _43 Easy bleeding   _44 Hypercoagulable state   _45 Anemic   Gastrointestinal:  _46 Blood in stool   _47 Vomiting blood  _48 Gastroesophageal reflux/heartburn   _49 Abdominal pain Genitourinary:  _50 Chronic kidney disease   _51 Difficult urination  _52 Frequent urination   _53 Burning with urination   _54 Hematuria Skin:  _55 Rashes   _56 Ulcers   _57 Wounds Psychological:  _58 History of anxiety   _59  History of major depression.  Physical Examination  BP 132/61 (BP Location: Right Arm)   Pulse 66   Resp 16   Wt 199 lb 9.6 oz (90.5 kg)   BMI 35.36 kg/m  Gen:  WD/WN, NAD Head: [place]/AT, No temporalis wasting. Ear/Nose/Throat: Hearing grossly intact, nares w/o erythema or drainage, trachea midline Eyes: Conjunctiva clear. Sclera non-icteric Neck: Supple.  No JVD.  Pulmonary:  Good air movement, no use of accessory muscles.  Cardiac: RRR, normal S1, S2 Vascular:  Vessel Right Left  Radial Palpable Palpable                                    Musculoskeletal: M/S 5/5 throughout.  No deformity or atrophy. 1+ BLE edema. Neurologic: Sensation grossly intact in extremities.  Symmetrical.  Speech is fluent.  Psychiatric: Judgment intact, Mood & affect appropriate for pt's clinical situation. Dermatologic: No rashes or ulcers noted.  No cellulitis or open wounds.       Labs Recent Results (from the past 2160 hour(s))  CBC with Differential/Platelet     Status: Abnormal   Collection Time: 03/26/17  1:00 PM  Result Value Ref Range   WBC 7.0 3.6 - 11.0 K/uL   RBC 3.19 (L) 3.80 - 5.20 MIL/uL   Hemoglobin 10.3 (L) 12.0 - 16.0 g/dL   HCT 30.2 (L) 35.0 - 47.0 %  MCV 94.8 80.0 - 100.0 fL   MCH 32.4 26.0 - 34.0 pg   MCHC 34.2 32.0 - 36.0 g/dL   RDW 14.4 11.5 - 14.5 %   Platelets 188 150 - 440 K/uL   Neutrophils Relative % 68 %   Neutro Abs 4.8 1.4 - 6.5 K/uL   Lymphocytes Relative 18 %   Lymphs Abs 1.3 1.0 - 3.6 K/uL   Monocytes Relative 8 %   Monocytes Absolute 0.5 0.2 - 0.9 K/uL   Eosinophils Relative 2 %   Eosinophils Absolute 0.1 0 - 0.7 K/uL   Basophils Relative 4 %   Basophils Absolute 0.3 (H) 0 - 0.1 K/uL  Basic metabolic panel     Status: None   Collection Time: 03/26/17  1:00 PM  Result Value Ref Range   Sodium 135 135 - 145 mmol/L    Potassium 3.9 3.5 - 5.1 mmol/L   Chloride 101 101 - 111 mmol/L   CO2 28 22 - 32 mmol/L   Glucose, Bld 98 65 - 99 mg/dL   BUN 16 6 - 20 mg/dL   Creatinine, Ser 0.78 0.44 - 1.00 mg/dL   Calcium 8.9 8.9 - 10.3 mg/dL   GFR calc non Af Amer >60 >60 mL/min   GFR calc Af Amer >60 >60 mL/min    Comment: (NOTE) The eGFR has been calculated using the CKD EPI equation. This calculation has not been validated in all clinical situations. eGFR's persistently <60 mL/min signify possible Chronic Kidney Disease.    Anion gap 6 5 - 15  Ferritin     Status: None   Collection Time: 03/26/17  1:00 PM  Result Value Ref Range   Ferritin 47 11 - 307 ng/mL  Iron and TIBC     Status: None   Collection Time: 03/26/17  1:00 PM  Result Value Ref Range   Iron 71 28 - 170 ug/dL   TIBC 292 250 - 450 ug/dL   Saturation Ratios 24 10.4 - 31.8 %   UIBC 221 ug/dL  Hepatic function panel     Status: None   Collection Time: 05/12/17  9:50 AM  Result Value Ref Range   Total Bilirubin 0.5 0.2 - 1.2 mg/dL   Bilirubin, Direct 0.2 0.0 - 0.3 mg/dL   Alkaline Phosphatase 78 39 - 117 U/L   AST 15 0 - 37 U/L   ALT 8 0 - 35 U/L   Total Protein 6.9 6.0 - 8.3 g/dL   Albumin 4.1 3.5 - 5.2 g/dL  Lipid panel     Status: Abnormal   Collection Time: 05/12/17  9:50 AM  Result Value Ref Range   Cholesterol 108 0 - 200 mg/dL    Comment: ATP III Classification       Desirable:  < 200 mg/dL               Borderline High:  200 - 239 mg/dL          High:  > = 240 mg/dL   Triglycerides 88.0 0.0 - 149.0 mg/dL    Comment: Normal:  <150 mg/dLBorderline High:  150 - 199 mg/dL   HDL 35.20 (L) >39.00 mg/dL   VLDL 17.6 0.0 - 40.0 mg/dL   LDL Cholesterol 55 0 - 99 mg/dL   Total CHOL/HDL Ratio 3     Comment:                Men          Women1/2 Average  Risk     3.4          3.3Average Risk          5.0          4.42X Average Risk          9.6          7.13X Average Risk          15.0          11.0                       NonHDL 72.94      Comment: NOTE:  Non-HDL goal should be 30 mg/dL higher than patient's LDL goal (i.e. LDL goal of < 70 mg/dL, would have non-HDL goal of < 100 mg/dL)  Basic metabolic panel     Status: None   Collection Time: 05/12/17  9:50 AM  Result Value Ref Range   Sodium 141 135 - 145 mEq/L   Potassium 4.6 3.5 - 5.1 mEq/L   Chloride 101 96 - 112 mEq/L   CO2 32 19 - 32 mEq/L   Glucose, Bld 90 70 - 99 mg/dL   BUN 16 6 - 23 mg/dL   Creatinine, Ser 0.81 0.40 - 1.20 mg/dL   Calcium 9.7 8.4 - 10.5 mg/dL   GFR 71.56 >60.00 mL/min    Radiology No results found.    Assessment/Plan  CHF (congestive heart failure) Can contribute to lower extremity swelling.  Hypercholesterolemia lipid control important in reducing the progression of atherosclerotic disease. Continue statin therapy   Lymphedema Her swelling is currently pretty well controlled.  She has the lymphedema pump should the swelling worsen.  I will stretch out her follow-up and plan to see her back in about 6 months.    Leotis Pain, MD  06/17/2017 2:37 PM    This note was created with Dragon medical transcription system.  Any errors from dictation are purely unintentional

## 2017-06-17 NOTE — Assessment & Plan Note (Signed)
Can contribute to lower extremity swelling.

## 2017-06-17 NOTE — Assessment & Plan Note (Signed)
Her swelling is currently pretty well controlled.  She has the lymphedema pump should the swelling worsen.  I will stretch out her follow-up and plan to see her back in about 6 months.

## 2017-06-17 NOTE — Assessment & Plan Note (Signed)
lipid control important in reducing the progression of atherosclerotic disease. Continue statin therapy

## 2017-06-19 ENCOUNTER — Other Ambulatory Visit: Payer: Self-pay | Admitting: Orthopedic Surgery

## 2017-06-19 DIAGNOSIS — G8929 Other chronic pain: Secondary | ICD-10-CM

## 2017-06-19 DIAGNOSIS — M5440 Lumbago with sciatica, unspecified side: Principal | ICD-10-CM

## 2017-06-30 ENCOUNTER — Telehealth: Payer: Self-pay | Admitting: Internal Medicine

## 2017-06-30 DIAGNOSIS — J449 Chronic obstructive pulmonary disease, unspecified: Secondary | ICD-10-CM | POA: Diagnosis not present

## 2017-06-30 DIAGNOSIS — M25511 Pain in right shoulder: Secondary | ICD-10-CM

## 2017-06-30 NOTE — Telephone Encounter (Signed)
Pt called stating that she needs to get a xray of her shoulder and she stated that she needs to get it done at the hospital due to not being able to stand. Please advise?   Call pt @ [phone].

## 2017-06-30 NOTE — Telephone Encounter (Signed)
Patient states when she was here on 9/11 for CPE you ordered a xray of shoulder but she can not have done here. Not able to stand. Patient was going to go by hospital that day but she was not able to. She wants to know if you can put in new order. She is still having pian with no changes.

## 2017-07-01 NOTE — Telephone Encounter (Signed)
Left message to return call to our office.

## 2017-07-01 NOTE — Telephone Encounter (Signed)
Order placed for right shoulder xray to be done at [facility]

## 2017-07-02 NOTE — Telephone Encounter (Signed)
Left message to return call to our office.  

## 2017-07-02 NOTE — Telephone Encounter (Signed)
Called patient and let her know.

## 2017-07-08 ENCOUNTER — Ambulatory Visit
Admission: RE | Admit: 2017-07-08 | Discharge: 2017-07-08 | Disposition: A | Discharge status: Home / Self Care | Payer: PPO | Source: Ambulatory Visit | Attending: Orthopedic Surgery | Admitting: Orthopedic Surgery

## 2017-07-08 DIAGNOSIS — G8929 Other chronic pain: Secondary | ICD-10-CM

## 2017-07-08 DIAGNOSIS — M5440 Lumbago with sciatica, unspecified side: Principal | ICD-10-CM

## 2017-07-10 ENCOUNTER — Telehealth: Payer: Self-pay | Admitting: Pulmonary Disease

## 2017-07-10 ENCOUNTER — Other Ambulatory Visit: Payer: Self-pay | Admitting: Internal Medicine

## 2017-07-10 NOTE — Telephone Encounter (Signed)
OK, please make sure they know about the [facility] Pulmonary office

## 2017-07-10 NOTE — Telephone Encounter (Signed)
Will forward to BQ as FYI.

## 2017-07-14 ENCOUNTER — Other Ambulatory Visit: Payer: Self-pay | Admitting: Orthopedic Surgery

## 2017-07-14 DIAGNOSIS — F330 Major depressive disorder, recurrent, mild: Secondary | ICD-10-CM | POA: Diagnosis not present

## 2017-07-14 DIAGNOSIS — G8929 Other chronic pain: Secondary | ICD-10-CM

## 2017-07-14 DIAGNOSIS — M5440 Lumbago with sciatica, unspecified side: Principal | ICD-10-CM

## 2017-07-14 DIAGNOSIS — F419 Anxiety disorder, unspecified: Secondary | ICD-10-CM | POA: Diagnosis not present

## 2017-07-14 DIAGNOSIS — F5105 Insomnia due to other mental disorder: Secondary | ICD-10-CM | POA: Diagnosis not present

## 2017-07-18 ENCOUNTER — Ambulatory Visit (HOSPITAL_COMMUNITY): Payer: PPO

## 2017-07-18 ENCOUNTER — Ambulatory Visit
Admission: RE | Admit: 2017-07-18 | Discharge: 2017-07-18 | Disposition: A | Discharge status: Home / Self Care | Payer: PPO | Source: Ambulatory Visit | Attending: Internal Medicine | Admitting: Internal Medicine

## 2017-07-18 ENCOUNTER — Other Ambulatory Visit
Admission: RE | Admit: 2017-07-18 | Discharge: 2017-07-18 | Disposition: A | Discharge status: Home / Self Care | Payer: PPO | Source: Ambulatory Visit | Attending: Internal Medicine | Admitting: Internal Medicine

## 2017-07-18 ENCOUNTER — Other Ambulatory Visit: Payer: Self-pay | Admitting: Orthopedic Surgery

## 2017-07-18 ENCOUNTER — Ambulatory Visit
Admission: RE | Admit: 2017-07-18 | Discharge: 2017-07-18 | Disposition: A | Discharge status: Home / Self Care | Payer: PPO | Source: Ambulatory Visit | Attending: Orthopedic Surgery | Admitting: Orthopedic Surgery

## 2017-07-18 ENCOUNTER — Other Ambulatory Visit: Payer: Self-pay | Admitting: Internal Medicine

## 2017-07-18 DIAGNOSIS — G8929 Other chronic pain: Secondary | ICD-10-CM | POA: Diagnosis not present

## 2017-07-18 DIAGNOSIS — M25511 Pain in right shoulder: Secondary | ICD-10-CM

## 2017-07-18 DIAGNOSIS — E7800 Pure hypercholesterolemia, unspecified: Secondary | ICD-10-CM

## 2017-07-18 DIAGNOSIS — I10 Essential (primary) hypertension: Secondary | ICD-10-CM

## 2017-07-18 DIAGNOSIS — Z969 Presence of functional implant, unspecified: Secondary | ICD-10-CM | POA: Diagnosis not present

## 2017-07-18 DIAGNOSIS — M5440 Lumbago with sciatica, unspecified side: Principal | ICD-10-CM

## 2017-07-18 DIAGNOSIS — M545 Low back pain: Secondary | ICD-10-CM | POA: Diagnosis not present

## 2017-07-18 DIAGNOSIS — M810 Age-related osteoporosis without current pathological fracture: Secondary | ICD-10-CM

## 2017-07-18 LAB — HEPATIC FUNCTION PANEL
ALBUMIN: 3.9 g/dL (ref 3.5–5.0)
ALT: 12 U/L — ABNORMAL LOW (ref 14–54)
AST: 21 U/L (ref 15–41)
Alkaline Phosphatase: 92 U/L (ref 38–126)
BILIRUBIN TOTAL: 0.2 mg/dL — AB (ref 0.3–1.2)
Bilirubin, Direct: <0.1 mg/dL — ABNORMAL LOW (ref 0.1–0.5)
Total Protein: 6.9 g/dL (ref 6.5–8.1)

## 2017-07-18 LAB — BASIC METABOLIC PANEL
Anion gap: 9 (ref 5–15)
BUN: 14 mg/dL (ref 6–20)
CALCIUM: 9.2 mg/dL (ref 8.9–10.3)
CO2: 30 mmol/L (ref 22–32)
CREATININE: 0.78 mg/dL (ref 0.44–1.00)
Chloride: 99 mmol/L — ABNORMAL LOW (ref 101–111)
GFR calc Af Amer: >60 mL/min (ref >60)
GFR calc non Af Amer: >60 mL/min (ref >60)
GLUCOSE: 114 mg/dL — AB (ref 65–99)
Potassium: 3.8 mmol/L (ref 3.5–5.1)
Sodium: 138 mmol/L (ref 135–145)

## 2017-07-18 LAB — LIPID PANEL
Cholesterol: 116 mg/dL (ref 0–200)
HDL: 39 mg/dL — ABNORMAL LOW (ref >40)
LDL Cholesterol: 59 mg/dL (ref 0–99)
TRIGLYCERIDES: 91 mg/dL (ref <150)
Total CHOL/HDL Ratio: 3 RATIO
VLDL: 18 mg/dL (ref 0–40)

## 2017-07-19 LAB — VITAMIN D 25 HYDROXY (VIT D DEFICIENCY, FRACTURES): Vit D, 25-Hydroxy: 24.8 ng/mL — ABNORMAL LOW (ref 30.0–100.0)

## 2017-07-23 ENCOUNTER — Ambulatory Visit: Admission: RE | Admit: 2017-07-23 | Payer: PPO | Source: Ambulatory Visit

## 2017-07-28 ENCOUNTER — Encounter: Payer: Self-pay | Admitting: Internal Medicine

## 2017-07-28 DIAGNOSIS — M519 Unspecified thoracic, thoracolumbar and lumbosacral intervertebral disc disorder: Secondary | ICD-10-CM | POA: Diagnosis not present

## 2017-07-28 DIAGNOSIS — M5416 Radiculopathy, lumbar region: Secondary | ICD-10-CM | POA: Diagnosis not present

## 2017-07-28 DIAGNOSIS — K5903 Drug induced constipation: Secondary | ICD-10-CM | POA: Diagnosis not present

## 2017-07-28 DIAGNOSIS — Z79899 Other long term (current) drug therapy: Secondary | ICD-10-CM | POA: Diagnosis not present

## 2017-07-28 DIAGNOSIS — S76312A Strain of muscle, fascia and tendon of the posterior muscle group at thigh level, left thigh, initial encounter: Secondary | ICD-10-CM | POA: Diagnosis not present

## 2017-07-28 DIAGNOSIS — Q675 Congenital deformity of spine: Secondary | ICD-10-CM | POA: Diagnosis not present

## 2017-07-29 NOTE — Telephone Encounter (Signed)
I see her daughter and her daughter has not called (not sure that we can tell Ms Bergevin this).  I would recommend notifying Ms Philemon that if she is concerned about her daughter that she should speak with her daughter and have her daughter contact us.  Thanks

## 2017-07-30 NOTE — Telephone Encounter (Signed)
Notified patient of PCP statement she voiced understanding and said she will talk with her daughter.

## 2017-07-31 DIAGNOSIS — L89320 Pressure ulcer of left buttock, unstageable: Secondary | ICD-10-CM | POA: Diagnosis not present

## 2017-07-31 DIAGNOSIS — D1801 Hemangioma of skin and subcutaneous tissue: Secondary | ICD-10-CM | POA: Diagnosis not present

## 2017-07-31 DIAGNOSIS — L812 Freckles: Secondary | ICD-10-CM | POA: Diagnosis not present

## 2017-07-31 DIAGNOSIS — L400 Psoriasis vulgaris: Secondary | ICD-10-CM | POA: Diagnosis not present

## 2017-07-31 DIAGNOSIS — L821 Other seborrheic keratosis: Secondary | ICD-10-CM | POA: Diagnosis not present

## 2017-07-31 DIAGNOSIS — J449 Chronic obstructive pulmonary disease, unspecified: Secondary | ICD-10-CM | POA: Diagnosis not present

## 2017-07-31 DIAGNOSIS — Z1283 Encounter for screening for malignant neoplasm of skin: Secondary | ICD-10-CM | POA: Diagnosis not present

## 2017-07-31 DIAGNOSIS — L304 Erythema intertrigo: Secondary | ICD-10-CM | POA: Diagnosis not present

## 2017-07-31 DIAGNOSIS — L720 Epidermal cyst: Secondary | ICD-10-CM | POA: Diagnosis not present

## 2017-07-31 DIAGNOSIS — D225 Melanocytic nevi of trunk: Secondary | ICD-10-CM | POA: Diagnosis not present

## 2017-08-04 ENCOUNTER — Ambulatory Visit: Payer: PPO

## 2017-08-12 ENCOUNTER — Other Ambulatory Visit: Payer: Self-pay | Admitting: Internal Medicine

## 2017-08-13 ENCOUNTER — Telehealth: Payer: Self-pay

## 2017-08-13 NOTE — Telephone Encounter (Signed)
Copied from [place]. Topic: Medicare AWV >> Aug 13, 2017 11:21 AM Aurelio Brash B wrote: Reason for CRM: pt called to cancel and r/s her awv,  I cx her apt  but was unable to r/s for her, as the dates/times  she was available dr Nicki Reaper was not in office, my understanding the Dr has to be in office day of apt for December

## 2017-08-13 NOTE — Telephone Encounter (Signed)
Rescheduled for 08/29/17. Pt aware

## 2017-08-14 DIAGNOSIS — M25511 Pain in right shoulder: Secondary | ICD-10-CM | POA: Diagnosis not present

## 2017-08-14 DIAGNOSIS — G8929 Other chronic pain: Secondary | ICD-10-CM | POA: Diagnosis not present

## 2017-08-14 DIAGNOSIS — M5417 Radiculopathy, lumbosacral region: Secondary | ICD-10-CM | POA: Diagnosis not present

## 2017-08-14 DIAGNOSIS — M19011 Primary osteoarthritis, right shoulder: Secondary | ICD-10-CM | POA: Diagnosis not present

## 2017-08-14 DIAGNOSIS — M545 Low back pain: Secondary | ICD-10-CM | POA: Diagnosis not present

## 2017-08-15 ENCOUNTER — Ambulatory Visit: Payer: PPO

## 2017-08-15 DIAGNOSIS — M19011 Primary osteoarthritis, right shoulder: Secondary | ICD-10-CM | POA: Insufficient documentation

## 2017-08-21 ENCOUNTER — Other Ambulatory Visit: Payer: Self-pay | Admitting: Student

## 2017-08-21 DIAGNOSIS — G8929 Other chronic pain: Secondary | ICD-10-CM

## 2017-08-21 DIAGNOSIS — M545 Low back pain: Principal | ICD-10-CM

## 2017-08-21 DIAGNOSIS — M5417 Radiculopathy, lumbosacral region: Secondary | ICD-10-CM

## 2017-08-29 ENCOUNTER — Ambulatory Visit: Payer: PPO

## 2017-08-30 DIAGNOSIS — J449 Chronic obstructive pulmonary disease, unspecified: Secondary | ICD-10-CM | POA: Diagnosis not present

## 2017-09-03 ENCOUNTER — Other Ambulatory Visit: Payer: Self-pay | Admitting: Internal Medicine

## 2017-09-10 ENCOUNTER — Ambulatory Visit (INDEPENDENT_AMBULATORY_CARE_PROVIDER_SITE_OTHER): Payer: PPO

## 2017-09-10 VITALS — BP 134/62 | HR 68 | Temp 98.4°F | Resp 14 | Ht 61.0 in | Wt 190.8 lb

## 2017-09-10 DIAGNOSIS — Z0000 Encounter for general adult medical examination without abnormal findings: Secondary | ICD-10-CM | POA: Diagnosis not present

## 2017-09-10 DIAGNOSIS — Z1331 Encounter for screening for depression: Secondary | ICD-10-CM | POA: Diagnosis not present

## 2017-09-10 MED ORDER — TETANUS-DIPHTH-ACELL PERTUSSIS 5-2.5-18.5 LF-MCG/0.5 IM SUSP: 0.5000 mL | Freq: Once | INTRAMUSCULAR | Status: DC

## 2017-09-10 NOTE — Patient Instructions (Addendum)
  Ms. Elford , Thank you for taking time to come for your Medicare Wellness Visit. I appreciate your ongoing commitment to your health goals. Please review the following plan we discussed and let me know if I can assist you in the future.   These are the goals we discussed: Goals    . Maintain Healthy Lifestyle     Chair exercises  Low carb foods Stay hydrated       This is a list of the screening recommended for you and due dates:  Health Maintenance  Topic Date Due  . Tetanus Vaccine  03/04/1950  . Mammogram  11/08/2017  . Flu Shot  Completed  . DEXA scan (bone density measurement)  Completed  . Pneumonia vaccines  Completed    Follow up with Dr. Nicki Reaper as needed.    Bring a copy of your Economy and/or Living Will to be scanned into chart.  Have a great day!

## 2017-09-10 NOTE — Progress Notes (Signed)
Subjective:   Kimberly Roy is a 84 y.o. female who presents for Medicare Annual (Subsequent) preventive examination.  Review of Systems:  No ROS.  Medicare Wellness Visit. Additional risk factors are reflected in the social history.  Cardiac Risk Factors include: advanced age (>58mn, >>32women)     Objective:     Vitals: BP 134/62 (BP Location: Left Arm, Patient Position: Sitting, Cuff Size: Normal)   Pulse 68   Temp 98.4 F (36.9 C) (Oral)   Resp 14   Ht _0  (1.549 m)   Wt 190 lb 12.8 oz (86.5 kg)   SpO2 98%   BMI 36.05 kg/m   Body mass index is 36.05 kg/m.  Advanced Directives 09/10/2017 06/17/2017 03/26/2017 12/25/2016 08/21/2016 08/07/2016 08/02/2016  Does Patient Have a Medical Advance Directive? _1  Yes Yes  Type of AParamed[place]of [place] will [place] Living will Living will - [place];Healthcare Power of Attorney  Does patient want to make changes to medical advance directive? No - Patient declined - - - - - -  Copy of HW[place]omin Chart? No - copy requested - - - - - No - copy requested  Would patient like information on creating a medical advance directive? - - - - - - -    Tobacco Social History   Tobacco Use  Smoking Status Never Smoker  Smokeless Tobacco Never Used     Counseling given: Not Answered   Clinical Intake:  Pre-visit preparation completed: Yes  Pain : No/denies pain     Nutritional Status: BMI > 30  Obese Diabetes: No  How often do you need to have someone help you when you read instructions, pamphlets, or other written materials from your doctor or pharmacy?: 1 - Never  Interpreter Needed?: No     Past Medical History:  Diagnosis Date  . Anemia   . Anxiety   . Chest pain   . CHF (congestive heart failure) ([place]   . Constipation   . DDD (degenerative disc disease), cervical   .  Depression   . DVT (deep venous thrombosis) ([place]   . Dysphonia   . Dyspnea   . Fatty liver   . Fatty liver   . Headache   . Hyperlipidemia   . Hyperpiesia   . Hypertension   . Hypothyroidism   . Interstitial cystitis   . Left ventricular dysfunction   . Lymphedema   . Nephrolithiasis   . Obstructive sleep apnea   . Osteoarthritis    knees/cervical and lumbar spine  . Pulmonary hypertension ([place]   . Pulmonary nodules    followed by Dr FRaul Del . Pure hypercholesterolemia   . Renal cyst    right   Past Surgical History:  Procedure Laterality Date  . ABDOMINAL HYSTERECTOMY     ovaries left in place  . APPENDECTOMY    . Back Surgeries    . BREAST REDUCTION SURGERY     3/99  . CARDIAC CATHETERIZATION    . cataracts Bilateral   . CERVICAL SPINE SURGERY    . ESOPHAGEAL MANOMETRY N/A 08/02/2015   Procedure: ESOPHAGEAL MANOMETRY (EM);  Surgeon: MJosefine Class MD;  [facility];  Service: Endoscopy;  Laterality: N/A;  . ESOPHAGOGASTRODUODENOSCOPY N/A 02/27/2015   Procedure: ESOPHAGOGASTRODUODENOSCOPY (EGD);  Surgeon: PHulen Luster MD;  [facility];  Service: Gastroenterology;  Laterality: N/A;  . EXCISIONAL  HEMORRHOIDECTOMY    . HEMORRHOID SURGERY    . HIP SURGERY  2013   Right hip surgery  . KNEE ARTHROSCOPY     left and right  . REDUCTION MAMMAPLASTY Bilateral YRS AGO  . REPLACEMENT TOTAL KNEE Bilateral   . rotator cuff surgery     blilateral  . TONSILECTOMY/ADENOIDECTOMY WITH MYRINGOTOMY     Family History  Problem Relation Age of Onset  . Heart disease Mother   . Stroke Mother   . Hypertension Mother   . Heart disease Father        myocardial infarction age 84  . Breast cancer Neg Hx    Social History   Socioeconomic History  . Marital status: Married    Spouse name: None  . Number of children: 2  . Years of education: None  . Highest education level: None  Social Needs  . Financial resource strain: Not very hard  . Food  insecurity - worry: Never true  . Food insecurity - inability: Never true  . Transportation needs - medical: No  . Transportation needs - non-medical: No  Occupational History  . None  Tobacco Use  . Smoking status: Never Smoker  . Smokeless tobacco: Never Used  Substance and Sexual Activity  . Alcohol use: No    Alcohol/week: 0.0 oz  . Drug use: No  . Sexual activity: Not Currently  Other Topics Concern  . None  Social History Narrative  . None    Outpatient Encounter Medications as of 09/10/2017  Medication Sig  . aluminum-magnesium hydroxide-simethicone (MAALOX) 606-301-60 MG/5ML SUSP Take by mouth.  . AMITIZA 24 MCG capsule Take 25 mg by mouth 2 (two) times daily.  Marland Kitchen amLODipine (NORVASC) 10 MG tablet TAKE 1 TABLET BY MOUTH EVERY DAY  . atorvastatin (LIPITOR) 20 MG tablet TAKE 1 TABLET BY MOUTH EVERY NIGHT AT BEDTIME  . benazepril (LOTENSIN) 40 MG tablet TAKE 1 TABLET BY MOUTH EVERY DAY  . Calcium-Magnesium-Vitamin D 600-40-500 MG-MG-UNIT TB24 Take 630 mg by mouth 4 (four) times daily.  [name] Sodium 30-100 MG CAPS Take by mouth.  . cephALEXin (KEFLEX) 250 MG capsule   . Cholecalciferol (VITAMIN D3) 1000 UNITS CAPS Take by mouth.  . citalopram (CELEXA) 20 MG tablet   . DULoxetine (CYMBALTA) 60 MG capsule TAKE 1 CAPSULE BY MOUTH EVERY DAY.  Marland Kitchen FLUoxetine (PROZAC) 10 MG capsule Take 10 mg by mouth daily.  . furosemide (LASIX) 20 MG tablet TAKE 1 TABLET BY MOUTH EVERY DAY AS NEEDED.  Marland Kitchen gabapentin (NEURONTIN) 300 MG capsule Take 300 mg by mouth 3 (three) times daily.   . hydrochlorothiazide (HYDRODIURIL) 25 MG tablet TAKE 1 TABLET BY MOUTH EVERY DAY.  Marland Kitchen HYDROcodone-acetaminophen (NORCO) 10-325 MG per tablet Take 1 tablet by mouth 3 (three) times daily.   . Inulin (FIBER CHOICE PO) Take 2 tablets by mouth daily. Chew tablets  . magnesium oxide (MAG-OX) 400 MG tablet Take 1 tablet (400 mg total) by mouth daily.  . metoprolol succinate (TOPROL-XL) 100 MG 24 hr tablet  TAKE 1 TABLET BY MOUTH TWICE A DAY WITH OR IMMEDIATELY FOLLOWING A MEAL  . morphine (MS CONTIN) 30 MG 12 hr tablet Take 30 mg by mouth daily. Takes around midday  . morphine (MS CONTIN) 60 MG 12 hr tablet Take 60 mg by mouth 2 (two) times daily.   . mupirocin ointment (BACTROBAN) 2 % Apply to affected area on back bid  . nystatin (NYSTATIN) powder Apply topically 2 (two) times daily.  Marland Kitchen  nystatin cream (MYCOSTATIN) APPLY TO AFFECTED AREA TWICE A DAY  . pantoprazole (PROTONIX) 40 MG tablet TAKE 1 TABLET BY MOUTH TWICE A DAY.  Marland Kitchen polyethylene glycol powder (GLYCOLAX/MIRALAX) powder MIX 17 GRAMS AS MARKED IN BOTTLE TOP IN 8 OUNCES OF WATER MIX AND DRINK ONCE A DAY AS DIRECTED  . Probiotic Product (ALIGN PO) Take by mouth daily.  . ranitidine (ZANTAC) 150 MG tablet Take 150 mg by mouth 2 (two) times daily.  . sodium chloride (OCEAN) 0.65 % nasal spray Place 1 spray into the nose as needed.  Marland Kitchen SYNTHROID 112 MCG tablet TAKE 1 TABLET BY MOUTH DAILY. TAKE ON ANEMPTY STOMACH WITH A GLASS OFWATER 30-60 MINUTES BEFORE BREAKFAST  . triamcinolone cream (KENALOG) 0.1 %   . [DISCONTINUED] amoxicillin (AMOXIL) 250 MG capsule Take 250 mg by mouth 3 (three) times daily.  . fluticasone (VERAMYST) 27.5 MCG/SPRAY nasal spray Place 1 spray into the nose daily.  . [DISCONTINUED] Tdap (BOOSTRIX) injection 0.5 mL    No facility-administered encounter medications on file as of 09/10/2017.     Activities of Daily Living In your present state of health, do you have any difficulty performing the following activities: 09/10/2017  Hearing? Y  Vision? N  Difficulty concentrating or making decisions? N  Walking or climbing stairs? Y  Dressing or bathing? Y  Doing errands, shopping? Y  Preparing Food and eating ? Y  Using the Toilet? N  In the past six months, have you accidently leaked urine? N  Do you have problems with loss of bowel control? N  Managing your Medications? N  Managing your Finances? N  Housekeeping or  managing your Housekeeping? Y  Some recent data might be hidden    Patient Care Team: Einar Pheasant, [name], [name], [name], MD (Otolaryngology)    Assessment:   This is a routine wellness examination for Kimberly Roy. The goal of the wellness visit is to assist the patient how to close the gaps in care and create a preventative care plan for the patient.   The roster of all physicians providing medical care to patient is listed in the Snapshot section of the chart.  Taking calcium VIT D as appropriate/Osteoporosis reviewed.    Safety issues reviewed; Smoke and carbon monoxide detectors in the home. No firearms in the home.  Wears seatbelts when riding with others. Patient does wear sunscreen or protective clothing when in direct sunlight. No violence in the home.  Depression- PHQ 2 &9 complete.  No signs/symptoms or verbal communication regarding little pleasure in doing things, feeling down, depressed or hopeless. No changes in sleeping, energy, eating, concentrating.  No thoughts of self harm or harm towards others.  Time spent on this topic is 10 minutes.   Patient is alert, normal appearance, oriented to person/place/and time. Correctly identified the president of the [place], recall of 3/3 words, and performing simple calculations. Displays appropriate judgement and can read correct time from watch face.   No new identified risk were noted.  No failures at ADL's or IADL's.  Ambulates with rolling walker or wheelchair as needed.  BMI- discussed the importance of a healthy diet, water intake and the benefits of aerobic exercise. Educational material provided.   24 hour diet recall: Regular diet   Daily fluid intake: cups of caffeine, 4-6 cups of water  Dental- every 6 months.  Dr. Jacobo Forest.  Eye- Visual acuity not assessed per  patient preference since they  have regular follow up with the ophthalmologist.  Wears corrective lenses.  Sleep patterns- Sleeps 7 hours at night.  Wakes feeling rested. She does not wear the CPAP on a regular basis.    TDAP vaccine Rx requested; to be signed and left with the front office for pick up tomorrow.   Patient Concerns: None at this time. Follow up with PCP as needed.  Exercise Activities and Dietary recommendations Current Exercise Habits: Home exercise routine, Time (Minutes): 20, Frequency (Times/Week): 3, Weekly Exercise (Minutes/Week): 60, Intensity: Mild  Goals    . Maintain Healthy Lifestyle     Chair exercises  Low carb foods Stay hydrated       Fall Risk Fall Risk  09/10/2017 08/02/2016 07/22/2016 05/13/2016 03/08/2015  Falls in the past year? _0   Number falls in past yr: - - - - -  Risk for fall due to : - - - - Impaired balance/gait   Depression Screen PHQ 2/9 Scores 09/10/2017 08/02/2016 07/22/2016 05/13/2016  PHQ - 2 Score 0 0 0 0  PHQ- 9 Score 0 - - -     Cognitive Function MMSE - Mini Mental State Exam 03/08/2015  Orientation to time 5  Orientation to Place 5  Registration 3  Attention/ Calculation 5  Recall 3  Language- name 2 objects 2  Language- repeat 1  Language- follow 3 step command 3  Language- read & follow direction 1  Write a sentence 1  Copy design 1  Total score 30     6CIT Screen 09/10/2017 08/02/2016  What Year? 0 points 0 points  What month? 0 points 0 points  What time? 0 points 0 points  Count back from 20 0 points 0 points  Months in reverse 0 points 0 points  Repeat phrase 0 points 0 points  Total Score 0 0    Immunization History  Administered Date(s) Administered  . Influenza Split 07/13/2009, 07/03/2012  . Influenza, High Dose Seasonal PF 05/13/2016, 06/11/2017  . Influenza,inj,Quad PF,6+ Mos 05/25/2013, 05/31/2014, 04/27/2015  . Pneumococcal Conjugate-13 11/29/2014  . Pneumococcal-Unspecified 06/22/2001    Screening Tests Health  Maintenance  Topic Date Due  . TETANUS/TDAP  03/04/1950  . MAMMOGRAM  11/08/2017  . INFLUENZA VACCINE  Completed  . DEXA SCAN  Completed  . PNA vac Low Risk Adult  Completed      Plan:   End of life planning; Advance aging; Advanced directives discussed. Copy of current HCPOA/Living Will requested.    I have personally reviewed and noted the following in the patient's chart:   . Medical and social history . Use of alcohol, tobacco or illicit drugs  . Current medications and supplements . Functional ability and status . Nutritional status . Physical activity . Advanced directives . List of other physicians . Hospitalizations, surgeries, and ER visits in previous 12 months . Vitals . Screenings to include cognitive, depression, and falls . Referrals and appointments  In addition, I have reviewed and discussed with patient certain preventive protocols, quality metrics, and best practice recommendations. A written personalized care plan for preventive services as well as general preventive health recommendations were provided to patient.     OBrien-Blaney, Suan Pyeatt L, LPN  02/05/4402     I have reviewed the above information and agree with above.   Deborra Medina, MD

## 2017-09-11 ENCOUNTER — Telehealth: Payer: Self-pay

## 2017-09-11 ENCOUNTER — Other Ambulatory Visit: Payer: Self-pay | Admitting: Internal Medicine

## 2017-09-11 NOTE — Telephone Encounter (Signed)
Copied from [place] [phone]. Topic: Quick Communication - See Telephone Encounter >> Sep 11, 2017  1:56 PM Robina Ade, Helene Kelp D wrote: CRM for notification. See Telephone encounter for: 09/11/17. Patient called and would like to talk to Percell Belt about a tdap they talked about on her visit. She has questions and if she can call her after 3pm.

## 2017-09-11 NOTE — Telephone Encounter (Signed)
Spoke with patient and after contacting her insurance she has decided to schedule a nurse visit to receive the TDAP instead of receiving elsewhere.  Rx retrieved from the front office and appointment scheduled.

## 2017-09-11 NOTE — Telephone Encounter (Signed)
Please advise 

## 2017-09-11 NOTE — Progress Notes (Signed)
Opened in error

## 2017-09-12 ENCOUNTER — Ambulatory Visit: Payer: PPO

## 2017-09-12 ENCOUNTER — Ambulatory Visit
Admission: RE | Admit: 2017-09-12 | Discharge: 2017-09-12 | Disposition: A | Discharge status: Home / Self Care | Payer: PPO | Source: Ambulatory Visit | Attending: Student | Admitting: Student

## 2017-09-12 DIAGNOSIS — G8929 Other chronic pain: Secondary | ICD-10-CM

## 2017-09-12 DIAGNOSIS — M48061 Spinal stenosis, lumbar region without neurogenic claudication: Secondary | ICD-10-CM | POA: Insufficient documentation

## 2017-09-12 DIAGNOSIS — M5432 Sciatica, left side: Secondary | ICD-10-CM | POA: Diagnosis not present

## 2017-09-12 DIAGNOSIS — M545 Low back pain: Secondary | ICD-10-CM

## 2017-09-12 DIAGNOSIS — M5417 Radiculopathy, lumbosacral region: Secondary | ICD-10-CM

## 2017-09-12 MED ORDER — IOPAMIDOL (ISOVUE-M 200) INJECTION 41%
20.0000 mL | Freq: Once | INTRAMUSCULAR | Status: AC
  Administered 2017-09-12: 15 mL via INTRATHECAL

## 2017-09-12 NOTE — Discharge Instructions (Signed)
Myelogram, Care After °These instructions give you information about caring for yourself after your procedure. Your doctor may also give you more specific instructions. Call your doctor if you have any problems or questions after your procedure. °Follow these instructions at home: °· Drink enough fluid to keep your pee (urine) clear or pale yellow. °· Rest as told by your doctor. °· Lie flat with your head slightly raised (elevated). °· Do not bend, lift, or do any hard activities for 24-48 hours or as told by your doctor. °· Take over-the-counter and prescription medicines only as told by your doctor. °· Take care of and remove your bandage (dressing) as told by your doctor. °· Bathe or shower as told by your doctor. °Contact a health care provider if: °· You have a fever. °· You have a headache that lasts longer than 24 hours. °· You feel sick to your stomach (nauseous). °· You throw up (vomit). °· Your neck is stiff. °· Your legs feel numb. °· You cannot pee. °· You cannot poop (have a bowel movement). °· You have a rash. °· You are itchy or sneezing. °Get help right away if: °· You have new symptoms or your symptoms get worse. °· You have a seizure. °· You have trouble breathing. °This information is not intended to replace advice given to you by your health care provider. Make sure you discuss any questions you have with your health care provider. °Document Released: 05/28/2008 Document Revised: 04/18/2016 Document Reviewed: 06/01/2015 °Elsevier Interactive Patient Education © 2018 Elsevier Inc. ° °

## 2017-09-12 NOTE — Discharge Instructions (Signed)
Myelogram, Care After Refer to this sheet in the next few weeks. These instructions provide you with information about caring for yourself after your procedure. Your health care provider may also give you more specific instructions. Your treatment has been planned according to current medical practices, but problems sometimes occur. Call your health care provider if you have any problems or questions after your procedure. What can I expect after the procedure? After the procedure, it is common to have:  Soreness at your injection site.  A mild headache.  Follow these instructions at home:  Drink enough fluid to keep your urine clear or pale yellow. This will help flush out the dye (contrast material) from your spine.  Rest as told by your health care provider. Lie flat with your head slightly raised (elevated) to reduce the risk of headache.  Do not bend, lift, or do any strenuous activity for 24-48 hours or as told by your health care provider.  Take over-the-counter and prescription medicines only as told by your health care provider.  Take care of and remove your bandage (dressing) as told by your health care provider.  Bathe or shower as told by your health care provider. Contact a health care provider if:  You have a fever.  You have a headache that lasts longer than 24 hours.  You feel nauseous or vomit.  You have a stiff neck or numbness in your legs.  You are unable to urinate or have a bowel movement.  You develop a rash, itching, or sneezing. Get help right away if:  You have new symptoms or your symptoms get worse.  You have a seizure.  You have trouble breathing. This information is not intended to replace advice given to you by your health care provider. Make sure you discuss any questions you have with your health care provider. Document Released: 09/15/2015 Document Revised: 01/25/2016 Document Reviewed: 06/01/2015 Elsevier Interactive Patient Education   [employer].

## 2017-09-12 NOTE — Procedures (Signed)
Interventional Radiology Procedure Note  Procedure: Lumbar myelogram  Complications: None  Estimated Blood Loss: None  Findings: Unsuccessful attempt at LP at L3-4 due to ankylosis and fusion. Successful LP at L2-3 with 22G spinal needle.  16 mL of Iso 200-M contrast injected into thecal sac. Fluoro images obtained.  For lumbar CT to follow.  Venetia Night. Kathlene Cote, M.D Pager:  [phone]

## 2017-09-15 DIAGNOSIS — F330 Major depressive disorder, recurrent, mild: Secondary | ICD-10-CM | POA: Diagnosis not present

## 2017-09-15 DIAGNOSIS — F419 Anxiety disorder, unspecified: Secondary | ICD-10-CM | POA: Diagnosis not present

## 2017-09-15 DIAGNOSIS — F5105 Insomnia due to other mental disorder: Secondary | ICD-10-CM | POA: Diagnosis not present

## 2017-09-17 ENCOUNTER — Ambulatory Visit (INDEPENDENT_AMBULATORY_CARE_PROVIDER_SITE_OTHER): Payer: PPO | Admitting: *Deleted

## 2017-09-17 DIAGNOSIS — Z23 Encounter for immunization: Secondary | ICD-10-CM

## 2017-09-24 ENCOUNTER — Inpatient Hospital Stay: Payer: PPO | Attending: Internal Medicine

## 2017-09-24 DIAGNOSIS — D500 Iron deficiency anemia secondary to blood loss (chronic): Secondary | ICD-10-CM | POA: Diagnosis not present

## 2017-09-24 LAB — CBC WITH DIFFERENTIAL/PLATELET
BASOS ABS: 0 10*3/uL (ref 0–0.1)
BASOS PCT: 1 %
EOS ABS: 0.2 10*3/uL (ref 0–0.7)
Eosinophils Relative: 2 %
HEMATOCRIT: 33.6 % — AB (ref 35.0–47.0)
HEMOGLOBIN: 11.2 g/dL — AB (ref 12.0–16.0)
Lymphocytes Relative: 23 %
Lymphs Abs: 1.8 10*3/uL (ref 1.0–3.6)
MCH: 32.4 pg (ref 26.0–34.0)
MCHC: 33.4 g/dL (ref 32.0–36.0)
MCV: 97.1 fL (ref 80.0–100.0)
Monocytes Absolute: 0.5 10*3/uL (ref 0.2–0.9)
Monocytes Relative: 7 %
NEUTROS ABS: 5.1 10*3/uL (ref 1.4–6.5)
Neutrophils Relative %: 67 %
Platelets: 158 10*3/uL (ref 150–440)
RBC: 3.46 MIL/uL — ABNORMAL LOW (ref 3.80–5.20)
RDW: 14.5 % (ref 11.5–14.5)
WBC: 7.7 10*3/uL (ref 3.6–11.0)

## 2017-09-24 LAB — COMPREHENSIVE METABOLIC PANEL
ALK PHOS: 73 U/L (ref 38–126)
ALT: 11 U/L — ABNORMAL LOW (ref 14–54)
ANION GAP: 3 — AB (ref 5–15)
AST: 20 U/L (ref 15–41)
Albumin: 3.9 g/dL (ref 3.5–5.0)
BUN: 19 mg/dL (ref 6–20)
CALCIUM: 9.3 mg/dL (ref 8.9–10.3)
CO2: 32 mmol/L (ref 22–32)
CREATININE: 0.97 mg/dL (ref 0.44–1.00)
Chloride: 101 mmol/L (ref 101–111)
GFR calc Af Amer: >60 mL/min (ref >60)
GFR, EST NON AFRICAN AMERICAN: 52 mL/min — AB (ref >60)
Glucose, Bld: 95 mg/dL (ref 65–99)
Potassium: 4.9 mmol/L (ref 3.5–5.1)
SODIUM: 136 mmol/L (ref 135–145)
Total Bilirubin: 0.4 mg/dL (ref 0.3–1.2)
Total Protein: 6.8 g/dL (ref 6.5–8.1)

## 2017-09-24 LAB — IRON AND TIBC
Iron: 68 ug/dL (ref 28–170)
SATURATION RATIOS: 27 % (ref 10.4–31.8)
TIBC: 257 ug/dL (ref 250–450)
UIBC: 189 ug/dL

## 2017-09-24 LAB — FERRITIN: Ferritin: 78 ng/mL (ref 11–307)

## 2017-09-26 ENCOUNTER — Inpatient Hospital Stay (HOSPITAL_BASED_OUTPATIENT_CLINIC_OR_DEPARTMENT_OTHER): Payer: PPO | Admitting: Nurse Practitioner

## 2017-09-26 ENCOUNTER — Encounter: Payer: Self-pay | Admitting: Nurse Practitioner

## 2017-09-26 ENCOUNTER — Other Ambulatory Visit: Payer: Self-pay

## 2017-09-26 VITALS — BP 162/91 | HR 55 | Temp 97.6°F | Resp 20

## 2017-09-26 DIAGNOSIS — D500 Iron deficiency anemia secondary to blood loss (chronic): Secondary | ICD-10-CM | POA: Diagnosis not present

## 2017-09-26 NOTE — Assessment & Plan Note (Addendum)
1.  Chronic anemia-unclear etiology.  Hemoglobin 11.2/improved.  Asymptomatic.  Iron-68, ferritin-78 (improved), saturation ratio 27, TIBC 257.  Will hold off on Venofer at this time given improvement in bloodwork and that she is asymptomatic. Patient agrees.   Recheck lab and iron studies in 6 months then re-evaluation a few days later.

## 2017-09-26 NOTE — Progress Notes (Signed)
[place] NOTE  Patient Care Team: Einar Pheasant, [name], [name], [name], MD (Otolaryngology)  CHIEF COMPLAINTS/PURPOSE OF CONSULTATION: Anemia  # CHRONIC MILD ANEMIA hb ~11.[Previous Dr.Gittin pt]; [place] 2017- work up [place] [employer]; stool card; Iron/ monoclonal work up- NEGATIVE]   # CHRONIC INTERSTITIAL CYSTITIS/ wheel chair bound   No history exists.     HISTORY OF PRESENTING ILLNESS: pt is a poor historian.  [name] 84 y.o.  female  with a history of chronic anemia who returns to clinic today for follow-up.    Patient denies abdominal pain or dysuria.  Denies hematuria.  Denies chest pain or shortness of breath.  Denies cough.  Denies difficulty swallowing.  Has chronic swelling in legs and is wheelchair-bound.  States energy level is good and overall feels well.  Frustrated by inability to walk and being wheelchair-bound.    ROS: A complete 10 point review of system is done which is negative except mentioned above in history of present illness  MEDICAL HISTORY:  Past Medical History:  Diagnosis Date  . Anemia   . Anxiety   . Chest pain   . CHF (congestive heart failure) ([place])   . Constipation   . DDD (degenerative disc disease), cervical   . Depression   . DVT (deep venous thrombosis) ([place])   . Dysphonia   . Dyspnea   . Fatty liver   . Fatty liver   . Headache   . Hyperlipidemia   . Hyperpiesia   . Hypertension   . Hypothyroidism   . Interstitial cystitis   . Left ventricular dysfunction   . Lymphedema   . Nephrolithiasis   . Obstructive sleep apnea   . Osteoarthritis    knees/cervical and lumbar spine  . Pulmonary hypertension ([place])   . Pulmonary nodules    followed by Dr Raul Del  . Pure hypercholesterolemia   . Renal cyst    right    SURGICAL HISTORY: Past Surgical History:  Procedure Laterality Date   . ABDOMINAL HYSTERECTOMY     ovaries left in place  . APPENDECTOMY    . Back Surgeries    . BREAST REDUCTION SURGERY     3/99  . CARDIAC CATHETERIZATION    . cataracts Bilateral   . CERVICAL SPINE SURGERY    . ESOPHAGEAL MANOMETRY N/A 08/02/2015   Procedure: ESOPHAGEAL MANOMETRY (EM);  Surgeon: Josefine Class, MD;  Location: [facility] ENDOSCOPY;  Service: Endoscopy;  Laterality: N/A;  . ESOPHAGOGASTRODUODENOSCOPY N/A 02/27/2015   Procedure: ESOPHAGOGASTRODUODENOSCOPY (EGD);  Surgeon: Hulen Luster, MD;  Location: [facility] ENDOSCOPY;  Service: Gastroenterology;  Laterality: N/A;  . EXCISIONAL HEMORRHOIDECTOMY    . HEMORRHOID SURGERY    . HIP SURGERY  2013   Right hip surgery  . KNEE ARTHROSCOPY     left and right  . REDUCTION MAMMAPLASTY Bilateral YRS AGO  . REPLACEMENT TOTAL KNEE Bilateral   . rotator cuff surgery     blilateral  . TONSILECTOMY/ADENOIDECTOMY WITH MYRINGOTOMY      SOCIAL HISTORY: Social History   Socioeconomic History  . Marital status: Married    Spouse name: Not on file  . Number of children: 2  . Years of education: Not on file  . Highest education level: Not on file  Social Needs  . Financial resource strain: Not very hard  . Food insecurity - worry: Never true  . Food insecurity -  inability: Never true  . Transportation needs - medical: No  . Transportation needs - non-medical: No  Occupational History  . Not on file  Tobacco Use  . Smoking status: Never Smoker  . Smokeless tobacco: Never Used  Substance and Sexual Activity  . Alcohol use: No    Alcohol/week: 0.0 oz  . Drug use: No  . Sexual activity: Not Currently  Other Topics Concern  . Not on file  Social History Narrative  . Not on file    FAMILY HISTORY: Family History  Problem Relation Age of Onset  . Heart disease Mother   . Stroke Mother   . Hypertension Mother   . Heart disease Father        myocardial infarction age 28  . Breast cancer Neg Hx     ALLERGIES:  is allergic to  lyrica [pregabalin]; atarax [hydroxyzine]; dicyclomine; hydroxyzine hcl; levaquin [levofloxacin]; nucynta er [tapentadol hcl er]; oxybutynin; zoloft [sertraline hcl]; biaxin [clarithromycin]; sertraline; sulfa antibiotics; tape; and tapentadol.  MEDICATIONS:  Current Outpatient Medications  Medication Sig Dispense Refill  . aluminum-magnesium hydroxide-simethicone (MAALOX) 563-893-73 MG/5ML SUSP Take 15 mLs by mouth 4 (four) times daily -  before meals and at bedtime.     Marland Kitchen amLODipine (NORVASC) 10 MG tablet TAKE 1 TABLET BY MOUTH EVERY DAY 90 tablet 1  . atorvastatin (LIPITOR) 20 MG tablet TAKE 1 TABLET BY MOUTH EVERY NIGHT AT BEDTIME 90 tablet 1  . benazepril (LOTENSIN) 40 MG tablet TAKE 1 TABLET BY MOUTH EVERY DAY 90 tablet 1  . Calcium-Magnesium-Vitamin D 600-40-500 MG-MG-UNIT TB24 Take 630 mg by mouth 4 (four) times daily.    [name] Sodium 30-100 MG CAPS Take 1 capsule by mouth daily.     . cephALEXin (KEFLEX) 250 MG capsule Take 250 mg by mouth daily.     . Cholecalciferol (VITAMIN D3) 1000 UNITS CAPS Take by mouth.    . citalopram (CELEXA) 20 MG tablet Take 20 mg by mouth daily.     . DULoxetine (CYMBALTA) 60 MG capsule TAKE 1 CAPSULE BY MOUTH EVERY DAY. 30 capsule 3  . FLUoxetine (PROZAC) 10 MG capsule Take 10 mg by mouth daily.    . fluticasone (VERAMYST) 27.5 MCG/SPRAY nasal spray Place 1 spray into the nose daily. 10 g 0  . gabapentin (NEURONTIN) 300 MG capsule Take 300 mg by mouth 3 (three) times daily.     . hydrochlorothiazide (HYDRODIURIL) 25 MG tablet TAKE 1 TABLET BY MOUTH EVERY DAY. 90 tablet 1  . HYDROcodone-acetaminophen (NORCO) 10-325 MG per tablet Take 1 tablet by mouth 3 (three) times daily.     . Inulin (FIBER CHOICE PO) Take 2 tablets by mouth daily. Chew tablets    . magnesium oxide (MAG-OX) 400 MG tablet Take 1 tablet (400 mg total) by mouth daily. 30 tablet 1  . metoprolol succinate (TOPROL-XL) 100 MG 24 hr tablet TAKE 1 TABLET BY MOUTH TWICE A DAY  WITH OR IMMEDIATELY FOLLOWING A MEAL 180 tablet 2  . morphine (MS CONTIN) 30 MG 12 hr tablet Take 30 mg by mouth daily. Takes around midday    . morphine (MS CONTIN) 60 MG 12 hr tablet Take 60 mg by mouth 2 (two) times daily.     . mupirocin ointment (BACTROBAN) 2 % Apply to affected area on back bid 22 g 0  . Naldemedine Tosylate (SYMPROIC) 0.2 MG TABS Take 0.2 mg by mouth daily.    Marland Kitchen nystatin (NYSTATIN) powder Apply topically 2 (two) times daily. 15  g 0  . nystatin cream (MYCOSTATIN) APPLY TO AFFECTED AREA TWICE A DAY 30 g 1  . pantoprazole (PROTONIX) 40 MG tablet TAKE 1 TABLET BY MOUTH TWICE A DAY. 60 tablet 3  . polyethylene glycol powder (GLYCOLAX/MIRALAX) powder MIX 17 GRAMS AS MARKED IN BOTTLE TOP IN 8 OUNCES OF WATER MIX AND DRINK ONCE A DAY AS DIRECTED 527 g 3  . Probiotic Product (ALIGN PO) Take by mouth daily.    . ranitidine (ZANTAC) 150 MG tablet Take 150 mg by mouth 2 (two) times daily.    . sodium chloride (OCEAN) 0.65 % nasal spray Place 1 spray into the nose as needed.    Marland Kitchen SYNTHROID 112 MCG tablet TAKE 1 TABLET BY MOUTH DAILY. TAKE ON ANEMPTY STOMACH WITH A GLASS OFWATER 30-60 MINUTES BEFORE BREAKFAST 90 tablet 1  . triamcinolone cream (KENALOG) 0.1 %     . AMITIZA 24 MCG capsule Take 25 mg by mouth 2 (two) times daily.     No current facility-administered medications for this visit.       Marland Kitchen  PHYSICAL EXAMINATION: ECOG PERFORMANCE STATUS: 2 - Symptomatic, <50% confined to bed  Vitals:   09/26/17 1445  BP: (!) 162/91  Pulse: (!) 55  Resp: 20  Temp: 97.6 F (36.4 C)   There were no vitals filed for this visit.  EXAM LIMITED DUE TO MOBILITY:  GENERAL: Well-nourished well-developed; Alert, no distress and comfortable. Elderly. Obese. Alone. In wheelchair.  EYES: no pallor or icterus OROPHARYNX: no thrush or ulceration.   NECK: supple, no masses felt LYMPH:  no palpable lymphadenopathy in the cervical, axillary regions LUNGS: clear to auscultation and  No wheeze  or crackles HEART/CVS: regular rate & rhythm and no murmurs; 1+ edema RLE and L knee ABDOMEN: abdomen soft, non-tender and normal bowel sounds Musculoskeletal: no cyanosis of digits and no clubbing  PSYCH: alert & oriented x 3 with fluent speech NEURO: no focal motor/sensory deficits SKIN:  no rashes or significant lesions  LABORATORY DATA:  I have reviewed the data as listed Lab Results  Component Value Date   WBC 7.7 09/24/2017   HGB 11.2 (L) 09/24/2017   HCT 33.6 (L) 09/24/2017   MCV 97.1 09/24/2017   PLT 158 09/24/2017   Recent Labs    11/26/16 1054 03/26/17 1300 05/12/17 0950 07/18/17 1028 09/24/17 1315  NA 141 135 141 138 136  K 4.3 3.9 4.6 3.8 4.9  CL 100 101 101 99* 101  CO2 36* 28 32 30 32  GLUCOSE 88 98 90 114* 95  BUN _0 CREATININE 0.76 0.78 0.81 0.78 0.97  CALCIUM 9.3 8.9 9.7 9.2 9.3  GFRNONAA  --  >60  --  >60 52*  GFRAA  --  >60  --  >60 >60  PROT 6.6  --  6.9 6.9 6.8  ALBUMIN 4.0  --  4.1 3.9 3.9  AST 14  --  _1 ALT 8  --  8 12* 11*  ALKPHOS 93  --  78 92 73  BILITOT 0.4  --  0.5 0.2* 0.4  BILIDIR 0.1  --  0.2 <0.1*  --   IBILI  --   --   --  NOT CALCULATED  --     RADIOGRAPHIC STUDIES: I have personally reviewed the radiological images as listed and agreed with the findings in the report. Ct Lumbar Spine W Contrast  Result Date: 09/12/2017 CLINICAL DATA:  Chronic low back pain.  Lumbosacral radiculopathy. Left lower extremity radiculopathy. Ankylosing spondylitis. EXAM: CT MYELOGRAPHY LUMBAR SPINE TECHNIQUE: CT imaging of the lumbar spine was performed after Isovue 200 M contrast administration. Multiplanar CT image reconstructions were also generated. COMPARISON:  None. FINDINGS: Segmentation: 5 non rib-bearing lumbar type vertebral bodies are present. There is partial lumbarization of the S1 segment. Alignment: AP alignment is anatomic. Leftward curvature is centered at T11-12. Vertebrae: Vertebral body heights are maintained.  Moderate diffuse osteopenia is present. There is no focal lytic or blastic lesion. Conus medullaris: Extends to the L1-2 level and appears normal. Paraspinal and other soft tissues: Limited imaging the abdomen demonstrates atherosclerotic change. A simple cyst at the lower pole of the right kidney measures 5.5 cm. No significant adenopathy is present. Disc levels: T12-L1: Slight retrolisthesis and extensive endplate spurring is present. Bilateral facet hypertrophy is present. There effacement of the ventral CSF without distortion of the cord. Severe right and moderate left foraminal narrowing is present. T12-L1: A broad-based disc protrusion is present. There is fusion across the disc space. Moderate facet hypertrophy is noted. Mild foraminal narrowing is worse on the right. L1-2: A broad-based disc protrusion is present. Endplate spurring is asymmetric to the left. This results in left subarticular narrowing. Laminectomy is noted. Moderate foraminal narrowing is worse on the left. L2-3: Laminectomy is present. No significant central or foraminal narrowing is present. L3-4: Laminectomy is noted. There is fusion across the disc space. No residual or recurrent stenosis is present. L4-5: Laminectomy is present. There is fusion across the disc space. No residual or recurrent stenosis is present. L5-S1: There is fusion across the disc space and posterior elements. Mild foraminal narrowing is present bilaterally. IMPRESSION: 1. Extensive lumbar fusion extending posteriorly from T12 through S1. 2. No definite fusion across the disc space at L2-3 with some gas in the disc space suggesting some element of motion. 3. Leftward endplate spurring and facet disease results in left subarticular stenosis at L1-2. Moderate foraminal narrowing bilaterally at L1-2 is worse on the left. 4. Endplate spurring and facet hypertrophy contribute to mild foraminal narrowing at T12-L1, worse on the right. 5. Slight retrolisthesis, endplate  spurring, and facet hypertrophy results in moderate central canal stenosis with severe right and moderate left foraminal narrowing at T12-L1 Electronically Signed   By: San Morelle M.D.   On: 09/12/2017 12:01   Dg Myelogram Lumbar  Result Date: 09/12/2017 CLINICAL DATA:  Low back pain with left lower extremity radiculopathy. History of prior lumbar fusion. EXAM: LUMBAR MYELOGRAM FLUOROSCOPY TIME:  3 minutes and 54 seconds. PROCEDURE: After thorough discussion of risks and benefits of the procedure including bleeding, infection, injury to nerves, blood vessels, adjacent structures as well as headache and CSF leak, written and oral informed consent was obtained. Consent was obtained by Dr. Aletta Edouard. Time out was performed prior to initiating the procedure. Patient was positioned prone on the fluoroscopy table. Local anesthesia was provided with 1% lidocaine without epinephrine after prepped with Betadine and draped in the usual sterile fashion. Puncture was performed at the L2-3 level using a 3 1/2 inch 22-gauge spinal needle via right paramedian approach. Using a single pass through the dura, the needle was placed within the thecal sac, with return of clear CSF. 15 mL of Isovue M-200 was injected into the thecal sac, with normal opacification of the nerve roots and cauda equina consistent with free flow within the subarachnoid space. I personally performed the lumbar puncture and administered the intrathecal contrast. I also personally supervised  acquisition of the myelogram images. TECHNIQUE: Fluoroscopic spot images were obtained after administration of intrathecal contrast. COMPARISON:  Lumbar spine x-rays on 08/14/2017 FINDINGS: The fluoroscopic spot images demonstrate some degree of irregular and concentric spinal stenosis at the L1-2 level. No other significant central canal stenosis identified. No significant mass effect on exiting nerve roots below the L1-2 level. IMPRESSION: Lumbar  myelography performed demonstrating some degree of spinal stenosis at L1-2. A CT of the lumbar spine with intrathecal contrast was performed immediately following the fluoroscopic portion of the myelogram and will be dictated separately. Electronically Signed   By: Aletta Edouard M.D.   On: 09/12/2017 10:41    ASSESSMENT & PLAN: Very pleasant 84 year old female who returns to clinic today for routine follow-up.  Anemia due to blood loss, chronic 1.  Chronic anemia-unclear etiology.  Hemoglobin 11.2/improved.  Asymptomatic.  Iron-68, ferritin-78 (improved), saturation ratio 27, TIBC 257.  Will hold off on Venofer at this time given improvement in bloodwork and that she is asymptomatic. Patient agrees.   Recheck lab and iron studies in 6 months then re-evaluation a few days later.     Verlon Au, NP 09/26/2017 3:53 PM

## 2017-09-29 DIAGNOSIS — G253 Myoclonus: Secondary | ICD-10-CM | POA: Diagnosis not present

## 2017-09-29 DIAGNOSIS — M545 Low back pain: Secondary | ICD-10-CM | POA: Diagnosis not present

## 2017-09-29 DIAGNOSIS — G8929 Other chronic pain: Secondary | ICD-10-CM | POA: Diagnosis not present

## 2017-09-29 DIAGNOSIS — G608 Other hereditary and idiopathic neuropathies: Secondary | ICD-10-CM | POA: Diagnosis not present

## 2017-09-30 DIAGNOSIS — J449 Chronic obstructive pulmonary disease, unspecified: Secondary | ICD-10-CM | POA: Diagnosis not present

## 2017-10-02 ENCOUNTER — Encounter: Payer: Self-pay | Admitting: Internal Medicine

## 2017-10-02 ENCOUNTER — Ambulatory Visit (INDEPENDENT_AMBULATORY_CARE_PROVIDER_SITE_OTHER): Payer: PPO | Admitting: Internal Medicine

## 2017-10-02 DIAGNOSIS — M25511 Pain in right shoulder: Secondary | ICD-10-CM

## 2017-10-02 DIAGNOSIS — G629 Polyneuropathy, unspecified: Secondary | ICD-10-CM | POA: Diagnosis not present

## 2017-10-02 DIAGNOSIS — M5440 Lumbago with sciatica, unspecified side: Secondary | ICD-10-CM

## 2017-10-02 DIAGNOSIS — I872 Venous insufficiency (chronic) (peripheral): Secondary | ICD-10-CM | POA: Diagnosis not present

## 2017-10-02 DIAGNOSIS — Z6833 Body mass index (BMI) 33.0-33.9, adult: Secondary | ICD-10-CM | POA: Diagnosis not present

## 2017-10-02 DIAGNOSIS — M7989 Other specified soft tissue disorders: Secondary | ICD-10-CM | POA: Diagnosis not present

## 2017-10-02 DIAGNOSIS — E7800 Pure hypercholesterolemia, unspecified: Secondary | ICD-10-CM | POA: Diagnosis not present

## 2017-10-02 DIAGNOSIS — D649 Anemia, unspecified: Secondary | ICD-10-CM

## 2017-10-02 DIAGNOSIS — I10 Essential (primary) hypertension: Secondary | ICD-10-CM

## 2017-10-02 DIAGNOSIS — I509 Heart failure, unspecified: Secondary | ICD-10-CM

## 2017-10-02 DIAGNOSIS — K5909 Other constipation: Secondary | ICD-10-CM

## 2017-10-02 DIAGNOSIS — K219 Gastro-esophageal reflux disease without esophagitis: Secondary | ICD-10-CM

## 2017-10-02 DIAGNOSIS — G8929 Other chronic pain: Secondary | ICD-10-CM

## 2017-10-02 DIAGNOSIS — E039 Hypothyroidism, unspecified: Secondary | ICD-10-CM | POA: Diagnosis not present

## 2017-10-02 DIAGNOSIS — G4733 Obstructive sleep apnea (adult) (pediatric): Secondary | ICD-10-CM

## 2017-10-02 NOTE — Progress Notes (Addendum)
Patient ID: Kimberly Roy, female   DOB: 1930-11-14, 84 y.o.   MRN: [id number]   Subjective:    Patient ID: Kimberly Roy, female    DOB: 1930-09-03, 84 y.o.   MRN: [id number]  HPI  Patient here for a scheduled follow up.  She is accompanied by her husband.  History obtained from both of them.  Has had issues with persistent back and neck pain.  Has seen ortho, Dr Sharlet Salina and pain clinic.  Recently saw neurology.  On gabapentin.  Has f/u in pain clinic 10/2017.  Having some left knee discomfort.  Planning to f/u with Dr Marry Guan.  Has had shoulder pain.  Various joint pains.  Request referral back to rheumatology for evaluation.  Breathing stable.  No chest pain.  No abdominal pain.  Some constipation.  Taking 3 stool softener and miralax.  Concern regarding possible hemorrhoid.  No bleeding.     Past Medical History:  Diagnosis Date  . Anemia   . Anxiety   . Chest pain   . CHF (congestive heart failure) ([place])   . Constipation   . DDD (degenerative disc disease), cervical   . Depression   . DVT (deep venous thrombosis) ([place])   . Dysphonia   . Dyspnea   . Fatty liver   . Fatty liver   . Headache   . Hyperlipidemia   . Hyperpiesia   . Hypertension   . Hypothyroidism   . Interstitial cystitis   . Left ventricular dysfunction   . Lymphedema   . Nephrolithiasis   . Obstructive sleep apnea   . Osteoarthritis    knees/cervical and lumbar spine  . Pulmonary hypertension ([place])   . Pulmonary nodules    followed by Dr Raul Del  . Pure hypercholesterolemia   . Renal cyst    right   Past Surgical History:  Procedure Laterality Date  . ABDOMINAL HYSTERECTOMY     ovaries left in place  . APPENDECTOMY    . Back Surgeries    . BREAST REDUCTION SURGERY     3/99  . CARDIAC CATHETERIZATION    . cataracts Bilateral   . CERVICAL SPINE SURGERY    . ESOPHAGEAL MANOMETRY N/A 08/02/2015   Procedure: ESOPHAGEAL MANOMETRY (EM);  Surgeon: Josefine Class, MD;  Location: [facility] ENDOSCOPY;  Service:  Endoscopy;  Laterality: N/A;  . ESOPHAGOGASTRODUODENOSCOPY N/A 02/27/2015   Procedure: ESOPHAGOGASTRODUODENOSCOPY (EGD);  Surgeon: Hulen Luster, MD;  Location: [facility] ENDOSCOPY;  Service: Gastroenterology;  Laterality: N/A;  . EXCISIONAL HEMORRHOIDECTOMY    . HEMORRHOID SURGERY    . HIP SURGERY  2013   Right hip surgery  . KNEE ARTHROSCOPY     left and right  . REDUCTION MAMMAPLASTY Bilateral YRS AGO  . REPLACEMENT TOTAL KNEE Bilateral   . rotator cuff surgery     blilateral  . TONSILECTOMY/ADENOIDECTOMY WITH MYRINGOTOMY     Family History  Problem Relation Age of Onset  . Heart disease Mother   . Stroke Mother   . Hypertension Mother   . Heart disease Father        myocardial infarction age 41  . Breast cancer Neg Hx    Social History   Socioeconomic History  . Marital status: Married    Spouse name: None  . Number of children: 2  . Years of education: None  . Highest education level: None  Social Needs  . Financial resource strain: Not very hard  . Food insecurity - worry: Never true  .  Food insecurity - inability: Never true  . Transportation needs - medical: No  . Transportation needs - non-medical: No  Occupational History  . None  Tobacco Use  . Smoking status: Never Smoker  . Smokeless tobacco: Never Used  Substance and Sexual Activity  . Alcohol use: No    Alcohol/week: 0.0 oz  . Drug use: No  . Sexual activity: Not Currently  Other Topics Concern  . None  Social History Narrative  . None    Outpatient Encounter Medications as of 10/02/2017  Medication Sig  . aluminum-magnesium hydroxide-simethicone (MAALOX) 200-200-20 MG/5ML SUSP Take 15 mLs by mouth 4 (four) times daily -  before meals and at bedtime.   Marland Kitchen amLODipine (NORVASC) 10 MG tablet TAKE 1 TABLET BY MOUTH EVERY DAY  . atorvastatin (LIPITOR) 20 MG tablet TAKE 1 TABLET BY MOUTH EVERY NIGHT AT BEDTIME  . benazepril (LOTENSIN) 40 MG tablet TAKE 1 TABLET BY MOUTH EVERY DAY  . Calcium-Magnesium-Vitamin D  600-40-500 MG-MG-UNIT TB24 Take 630 mg by mouth 4 (four) times daily.  [name] Sodium 30-100 MG CAPS Take 1 capsule by mouth daily.   . cephALEXin (KEFLEX) 250 MG capsule Take 250 mg by mouth daily.   . Cholecalciferol (VITAMIN D3) 1000 UNITS CAPS Take by mouth.  . citalopram (CELEXA) 20 MG tablet Take 20 mg by mouth daily.   . DULoxetine (CYMBALTA) 60 MG capsule TAKE 1 CAPSULE BY MOUTH EVERY DAY.  Marland Kitchen FLUoxetine (PROZAC) 10 MG capsule Take 10 mg by mouth daily.  . fluticasone (VERAMYST) 27.5 MCG/SPRAY nasal spray Place 1 spray into the nose daily.  Marland Kitchen gabapentin (NEURONTIN) 300 MG capsule Take 300 mg by mouth 3 (three) times daily.   . hydrochlorothiazide (HYDRODIURIL) 25 MG tablet TAKE 1 TABLET BY MOUTH EVERY DAY.  Marland Kitchen HYDROcodone-acetaminophen (NORCO) 10-325 MG per tablet Take 1 tablet by mouth 3 (three) times daily.   . Inulin (FIBER CHOICE PO) Take 2 tablets by mouth daily. Chew tablets  . magnesium oxide (MAG-OX) 400 MG tablet Take 1 tablet (400 mg total) by mouth daily.  . metoprolol succinate (TOPROL-XL) 100 MG 24 hr tablet TAKE 1 TABLET BY MOUTH TWICE A DAY WITH OR IMMEDIATELY FOLLOWING A MEAL  . morphine (MS CONTIN) 30 MG 12 hr tablet Take 30 mg by mouth daily. Takes around midday  . morphine (MS CONTIN) 60 MG 12 hr tablet Take 60 mg by mouth 2 (two) times daily.   . mupirocin ointment (BACTROBAN) 2 % Apply to affected area on back bid  . Naldemedine Tosylate (SYMPROIC) 0.2 MG TABS Take 0.2 mg by mouth daily.  Marland Kitchen nystatin (NYSTATIN) powder Apply topically 2 (two) times daily.  Marland Kitchen nystatin cream (MYCOSTATIN) APPLY TO AFFECTED AREA TWICE A DAY  . pantoprazole (PROTONIX) 40 MG tablet TAKE 1 TABLET BY MOUTH TWICE A DAY.  Marland Kitchen polyethylene glycol powder (GLYCOLAX/MIRALAX) powder MIX 17 GRAMS AS MARKED IN BOTTLE TOP IN 8 OUNCES OF WATER MIX AND DRINK ONCE A DAY AS DIRECTED  . Probiotic Product (ALIGN PO) Take by mouth daily.  . ranitidine (ZANTAC) 150 MG tablet Take 150 mg by mouth 2  (two) times daily.  . sodium chloride (OCEAN) 0.65 % nasal spray Place 1 spray into the nose as needed.  Marland Kitchen SYNTHROID 112 MCG tablet TAKE 1 TABLET BY MOUTH DAILY. TAKE ON ANEMPTY STOMACH WITH A GLASS OFWATER 30-60 MINUTES BEFORE BREAKFAST  . triamcinolone cream (KENALOG) 0.1 %   . [DISCONTINUED] AMITIZA 24 MCG capsule Take 25 mg by mouth  2 (two) times daily.   No facility-administered encounter medications on file as of 10/02/2017.     Review of Systems  Constitutional: Negative for appetite change and unexpected weight change.  HENT: Negative for congestion and sinus pressure.   Respiratory: Negative for cough, chest tightness and shortness of breath.   Cardiovascular: Negative for chest pain, palpitations and leg swelling.  Gastrointestinal: Positive for constipation. Negative for abdominal pain, diarrhea, nausea and vomiting.  Genitourinary: Negative for difficulty urinating and dysuria.  Musculoskeletal: Positive for back pain.       Shoulder pain as outlined.    Skin: Negative for color change and rash.  Neurological: Negative for dizziness, light-headedness and headaches.  Psychiatric/Behavioral: Negative for agitation and dysphoric mood.       Objective:    Physical Exam  Constitutional: She appears well-developed and well-nourished. No distress.  HENT:  Nose: Nose normal.  Mouth/Throat: Oropharynx is clear and moist.  Neck: Neck supple. No thyromegaly present.  Cardiovascular: Normal rate and regular rhythm.  Pulmonary/Chest: Breath sounds normal. No respiratory distress. She has no wheezes.  Abdominal: Soft. Bowel sounds are normal. There is no tenderness.  Genitourinary:  Genitourinary Comments: Rectal exam - no thrombosed hemorrhoids.  No hemorrhoids - internal exam.  No blood.    Musculoskeletal: She exhibits no edema or tenderness.  Lymphadenopathy:    She has no cervical adenopathy.  Skin: No rash noted. No erythema.  Psychiatric: She has a normal mood and affect.  Her behavior is normal.    BP (!) 142/80 (BP Location: Left Arm, Patient Position: Sitting, Cuff Size: Normal)   Pulse (!) 50   Temp 98 F (36.7 C) (Oral)   Resp 18   Wt 190 lb 12.8 oz (86.5 kg)   SpO2 96%   BMI 33.80 kg/m  Wt Readings from Last 3 Encounters:  10/02/17 190 lb 12.8 oz (86.5 kg)  09/12/17 191 lb (86.6 kg)  09/10/17 190 lb 12.8 oz (86.5 kg)     Lab Results  Component Value Date   WBC 7.7 09/24/2017   HGB 11.2 (L) 09/24/2017   HCT 33.6 (L) 09/24/2017   PLT 158 09/24/2017   GLUCOSE 95 09/24/2017   CHOL 116 07/18/2017   TRIG 91 07/18/2017   HDL 39 (L) 07/18/2017   LDLCALC 59 07/18/2017   ALT 11 (L) 09/24/2017   AST 20 09/24/2017   NA 136 09/24/2017   K 4.9 09/24/2017   CL 101 09/24/2017   CREATININE 0.97 09/24/2017   BUN 19 09/24/2017   CO2 32 09/24/2017   TSH 1.94 11/26/2016   INR 1.0 12/08/2013   HGBA1C 6.0 10/19/2014    Ct Lumbar Spine W Contrast  Result Date: 09/12/2017 CLINICAL DATA:  Chronic low back pain. Lumbosacral radiculopathy. Left lower extremity radiculopathy. Ankylosing spondylitis. EXAM: CT MYELOGRAPHY LUMBAR SPINE TECHNIQUE: CT imaging of the lumbar spine was performed after Isovue 200 M contrast administration. Multiplanar CT image reconstructions were also generated. COMPARISON:  None. FINDINGS: Segmentation: 5 non rib-bearing lumbar type vertebral bodies are present. There is partial lumbarization of the S1 segment. Alignment: AP alignment is anatomic. Leftward curvature is centered at T11-12. Vertebrae: Vertebral body heights are maintained. Moderate diffuse osteopenia is present. There is no focal lytic or blastic lesion. Conus medullaris: Extends to the L1-2 level and appears normal. Paraspinal and other soft tissues: Limited imaging the abdomen demonstrates atherosclerotic change. A simple cyst at the lower pole of the right kidney measures 5.5 cm. No significant adenopathy is present. Disc  levels: T12-L1: Slight retrolisthesis and  extensive endplate spurring is present. Bilateral facet hypertrophy is present. There effacement of the ventral CSF without distortion of the cord. Severe right and moderate left foraminal narrowing is present. T12-L1: A broad-based disc protrusion is present. There is fusion across the disc space. Moderate facet hypertrophy is noted. Mild foraminal narrowing is worse on the right. L1-2: A broad-based disc protrusion is present. Endplate spurring is asymmetric to the left. This results in left subarticular narrowing. Laminectomy is noted. Moderate foraminal narrowing is worse on the left. L2-3: Laminectomy is present. No significant central or foraminal narrowing is present. L3-4: Laminectomy is noted. There is fusion across the disc space. No residual or recurrent stenosis is present. L4-5: Laminectomy is present. There is fusion across the disc space. No residual or recurrent stenosis is present. L5-S1: There is fusion across the disc space and posterior elements. Mild foraminal narrowing is present bilaterally. IMPRESSION: 1. Extensive lumbar fusion extending posteriorly from T12 through S1. 2. No definite fusion across the disc space at L2-3 with some gas in the disc space suggesting some element of motion. 3. Leftward endplate spurring and facet disease results in left subarticular stenosis at L1-2. Moderate foraminal narrowing bilaterally at L1-2 is worse on the left. 4. Endplate spurring and facet hypertrophy contribute to mild foraminal narrowing at T12-L1, worse on the right. 5. Slight retrolisthesis, endplate spurring, and facet hypertrophy results in moderate central canal stenosis with severe right and moderate left foraminal narrowing at T12-L1 Electronically Signed   By: San Morelle M.D.   On: 09/12/2017 12:01   Dg Myelogram Lumbar  Result Date: 09/12/2017 CLINICAL DATA:  Low back pain with left lower extremity radiculopathy. History of prior lumbar fusion. EXAM: LUMBAR MYELOGRAM  FLUOROSCOPY TIME:  3 minutes and 54 seconds. PROCEDURE: After thorough discussion of risks and benefits of the procedure including bleeding, infection, injury to nerves, blood vessels, adjacent structures as well as headache and CSF leak, written and oral informed consent was obtained. Consent was obtained by Dr. Aletta Edouard. Time out was performed prior to initiating the procedure. Patient was positioned prone on the fluoroscopy table. Local anesthesia was provided with 1% lidocaine without epinephrine after prepped with Betadine and draped in the usual sterile fashion. Puncture was performed at the L2-3 level using a 3 1/2 inch 22-gauge spinal needle via right paramedian approach. Using a single pass through the dura, the needle was placed within the thecal sac, with return of clear CSF. 15 mL of Isovue M-200 was injected into the thecal sac, with normal opacification of the nerve roots and cauda equina consistent with free flow within the subarachnoid space. I personally performed the lumbar puncture and administered the intrathecal contrast. I also personally supervised acquisition of the myelogram images. TECHNIQUE: Fluoroscopic spot images were obtained after administration of intrathecal contrast. COMPARISON:  Lumbar spine x-rays on 08/14/2017 FINDINGS: The fluoroscopic spot images demonstrate some degree of irregular and concentric spinal stenosis at the L1-2 level. No other significant central canal stenosis identified. No significant mass effect on exiting nerve roots below the L1-2 level. IMPRESSION: Lumbar myelography performed demonstrating some degree of spinal stenosis at L1-2. A CT of the lumbar spine with intrathecal contrast was performed immediately following the fluoroscopic portion of the myelogram and will be dictated separately. Electronically Signed   By: Aletta Edouard M.D.   On: 09/12/2017 10:41       Assessment & Plan:   Problem List Items Addressed This Visit  Anemia     Seeing hematology.        Back pain    Has been followed by ortho.  Seeing pain clinic.        BMI 33.0-33.9,adult    Discussed diet and exercise.  Follow.        CHF (congestive heart failure) ([facility])    No evidence of volume overload.  Continue f/u with cardiology.       Chronic constipation    Followed by GI.  Currently controlling on regimen as outlined.  Follow.       Chronic venous insufficiency    Continue compression hose.        GERD (gastroesophageal reflux disease)    Controlled on protonix.        Hypercholesterolemia    On lipitor.  Low cholesterol diet and exercise.  Follow lipid panel and liver function tests.        Relevant Orders   Hepatic function panel   Lipid panel   Hypertension    Blood pressure under reasonable control.  Follow pressures.  Same medication regimen.  Follow.        Relevant Orders   Basic metabolic panel   Hypothyroidism    On thyroid replacement.  Follow tsh.        Relevant Orders   TSH   Leg swelling    Continue compression hose.  Doing well.        Neuropathy    Seeing neurology.  On gabapentin.  Follow.       OSA (obstructive sleep apnea)    CPAP.       Right shoulder pain    S/p injection.  With various joint aches, knee pain and back pain, etc. Followed by pain clinic.  Has seen ortho.   Request referral back to rheumatology to discuss.        Relevant Orders   Ambulatory referral to Rheumatology       Einar Pheasant, MD

## 2017-10-05 ENCOUNTER — Encounter: Payer: Self-pay | Admitting: Internal Medicine

## 2017-10-05 NOTE — Assessment & Plan Note (Signed)
Continue compression hose.

## 2017-10-05 NOTE — Assessment & Plan Note (Signed)
Blood pressure under reasonable control.  Follow pressures.  Same medication regimen.  Follow.

## 2017-10-05 NOTE — Assessment & Plan Note (Signed)
Followed by GI.  Currently controlling on regimen as outlined.  Follow.

## 2017-10-05 NOTE — Assessment & Plan Note (Addendum)
S/p injection.  With various joint aches, knee pain and back pain, etc. Followed by pain clinic.  Has seen ortho.   Request referral back to rheumatology to discuss.

## 2017-10-05 NOTE — Assessment & Plan Note (Signed)
No evidence of volume overload.  Continue f/u with cardiology.   

## 2017-10-05 NOTE — Assessment & Plan Note (Signed)
Has been followed by ortho.  Seeing pain clinic.

## 2017-10-05 NOTE — Assessment & Plan Note (Signed)
Seeing neurology.  On gabapentin.  Follow.

## 2017-10-05 NOTE — Assessment & Plan Note (Signed)
Controlled on protonix.   

## 2017-10-05 NOTE — Assessment & Plan Note (Signed)
Discussed diet and exercise.  Follow.

## 2017-10-05 NOTE — Assessment & Plan Note (Signed)
On lipitor.  Low cholesterol diet and exercise.  Follow lipid panel and liver function tests.   

## 2017-10-05 NOTE — Addendum Note (Signed)
Addended by: Alisa Graff on: 10/05/2017 02:18 PM   Modules accepted: Orders

## 2017-10-05 NOTE — Assessment & Plan Note (Signed)
Continue compression hose.  Doing well.

## 2017-10-05 NOTE — Assessment & Plan Note (Signed)
On thyroid replacement.  Follow tsh.

## 2017-10-05 NOTE — Assessment & Plan Note (Signed)
CPAP.

## 2017-10-05 NOTE — Assessment & Plan Note (Signed)
Seeing hematology.

## 2017-10-08 ENCOUNTER — Other Ambulatory Visit: Payer: Self-pay | Admitting: Internal Medicine

## 2017-10-08 DIAGNOSIS — Z1231 Encounter for screening mammogram for malignant neoplasm of breast: Secondary | ICD-10-CM

## 2017-10-15 ENCOUNTER — Ambulatory Visit: Payer: PPO | Admitting: Internal Medicine

## 2017-10-16 DIAGNOSIS — M25562 Pain in left knee: Secondary | ICD-10-CM | POA: Diagnosis not present

## 2017-10-16 DIAGNOSIS — M5416 Radiculopathy, lumbar region: Secondary | ICD-10-CM | POA: Diagnosis not present

## 2017-10-16 DIAGNOSIS — G8929 Other chronic pain: Secondary | ICD-10-CM | POA: Diagnosis not present

## 2017-10-16 DIAGNOSIS — M25511 Pain in right shoulder: Secondary | ICD-10-CM | POA: Diagnosis not present

## 2017-10-25 IMAGING — CT CT HEAD W/O CM
1 of 2 series · 14 of 30 positions shown, 18 images · non-contrast
Comparison: 08/18/2015

CLINICAL DATA: High blood pressure and headache for a week.

EXAM:
CT HEAD WITHOUT CONTRAST
TECHNIQUE: Contiguous axial images were obtained from the base of the skull
through the vertex without intravenous contrast.

[Series 2: head wo · axial · 0.43mm/px · z∈[-182,-52]mm · 14 of 31 slices shown, 18 images]
[im 3/31  brain]
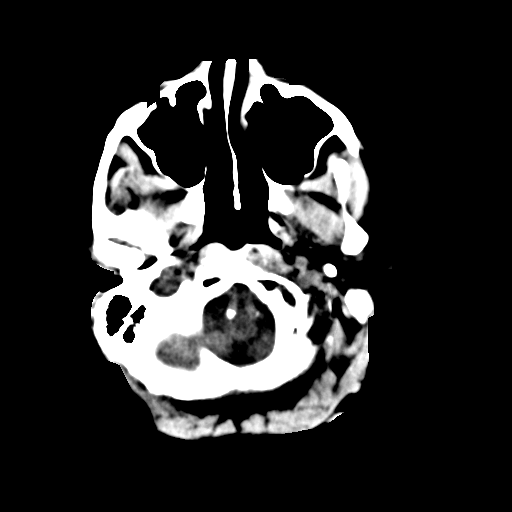
[im 3/31  bone]
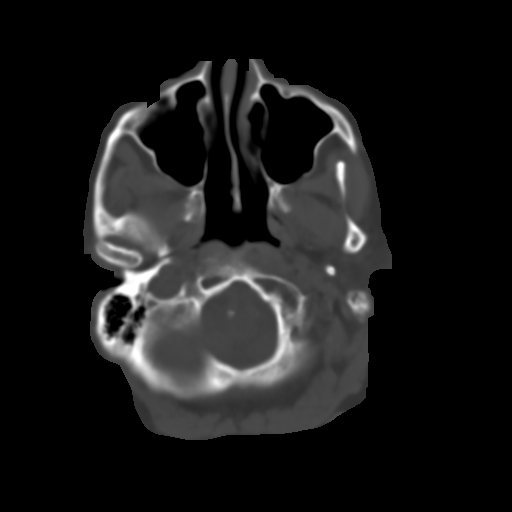
[im 5/31  brain]
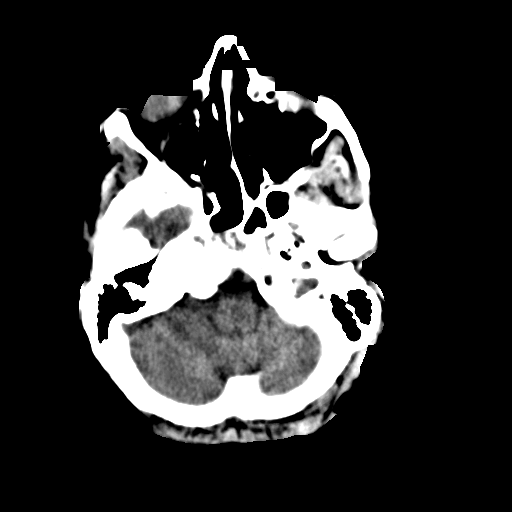
[im 7/31  brain]
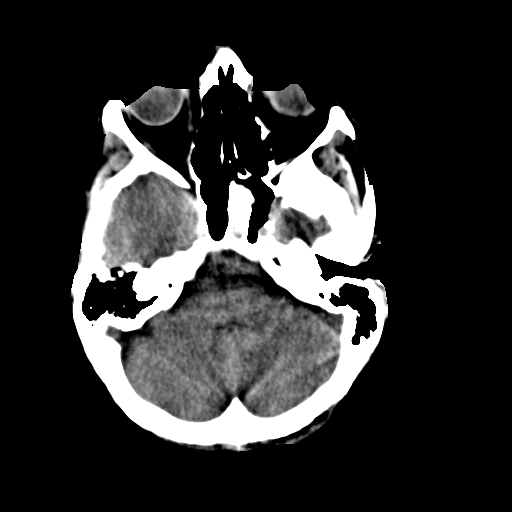
[im 9/31  brain]
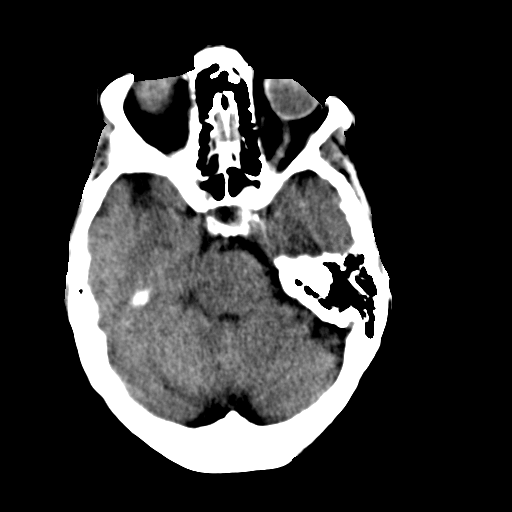
[im 11/31  brain]
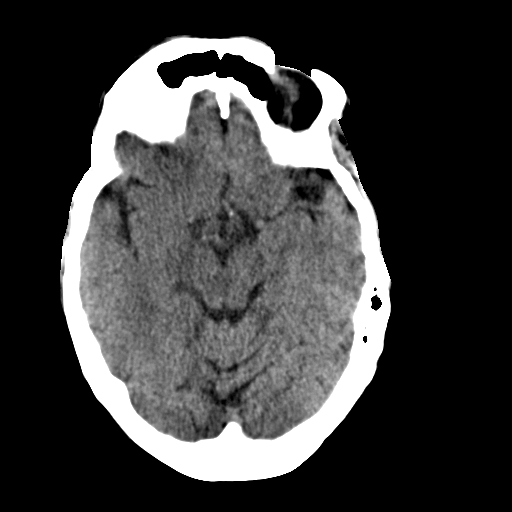
[im 11/31  bone]
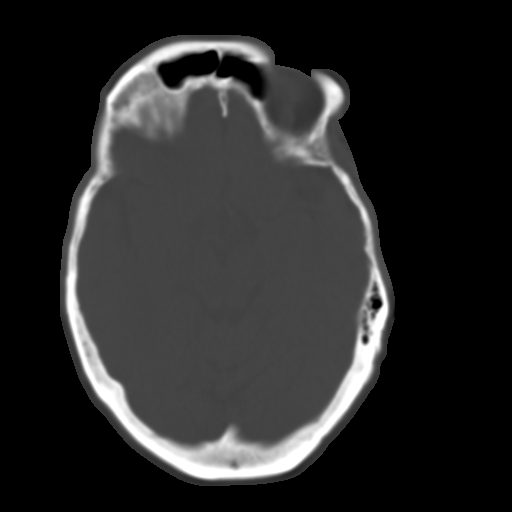
[im 13/31  brain]
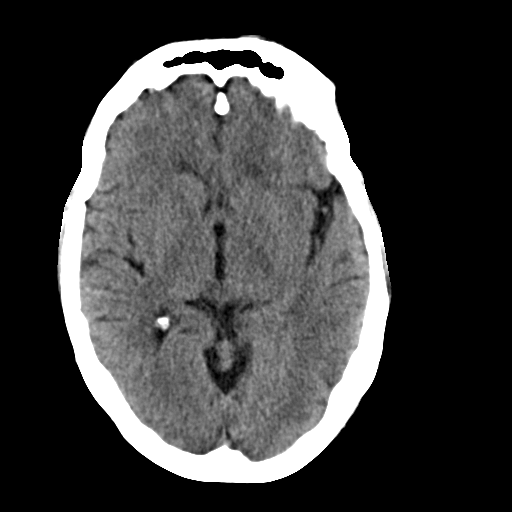
[im 15/31  brain]
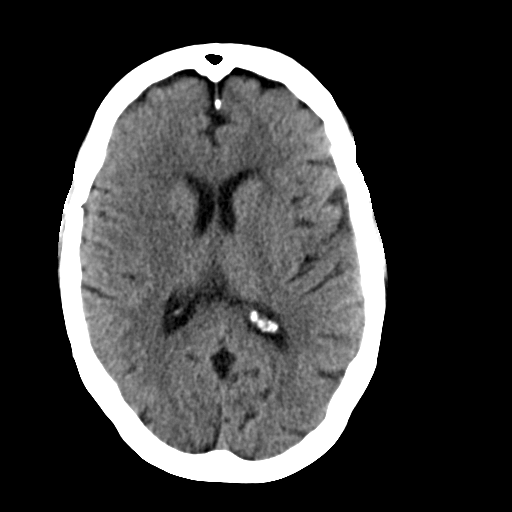
[im 17/31  brain]
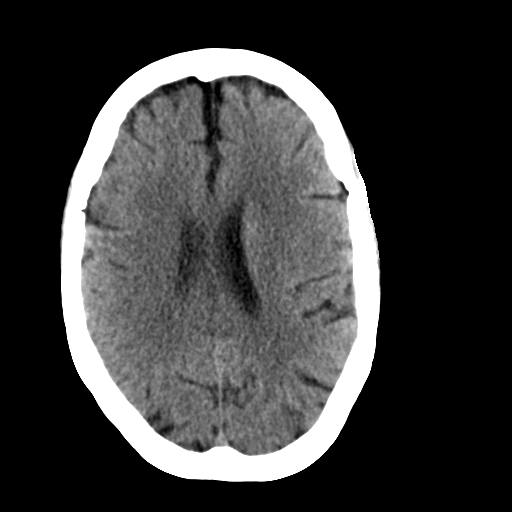
[im 19/31  brain]
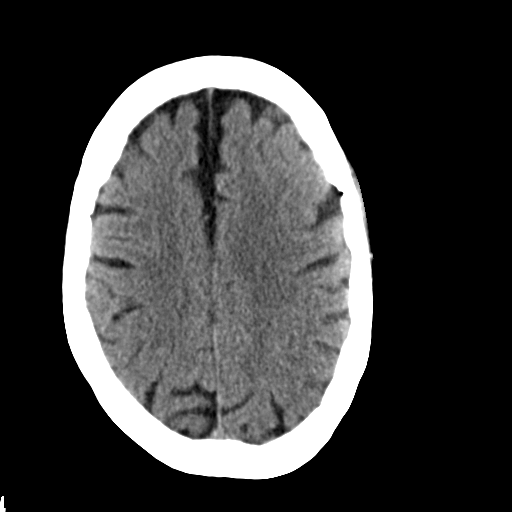
[im 19/31  bone]
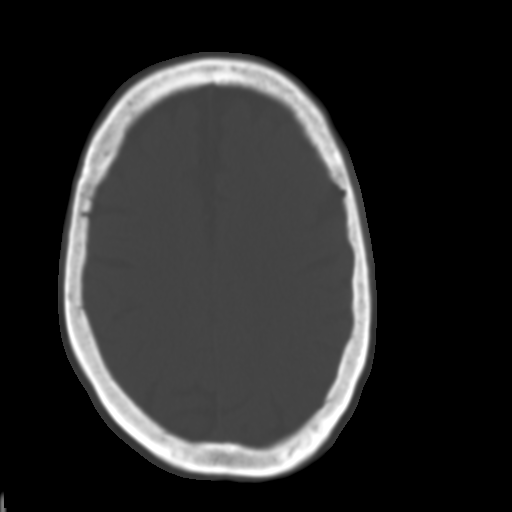
[im 21/31  brain]
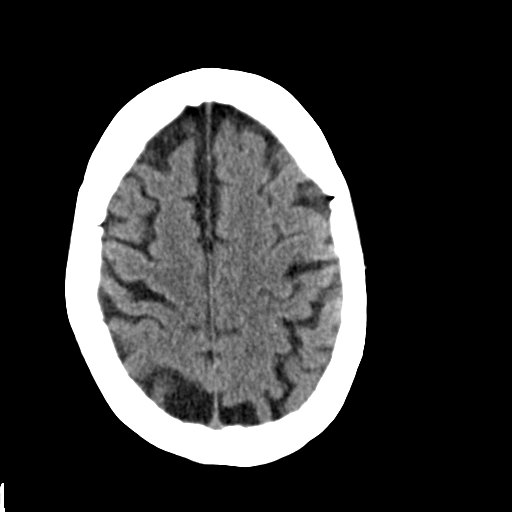
[im 23/31  brain]
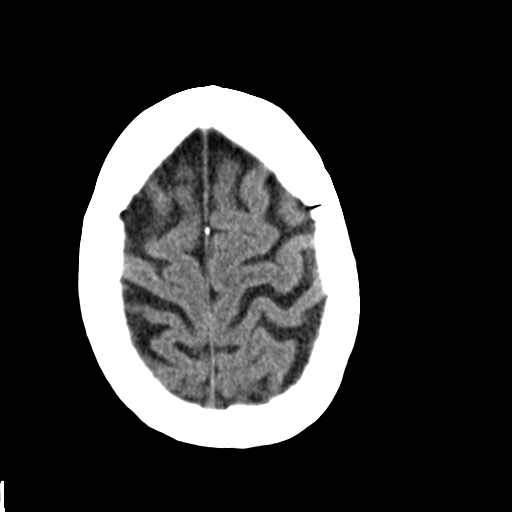
[im 25/31  brain]
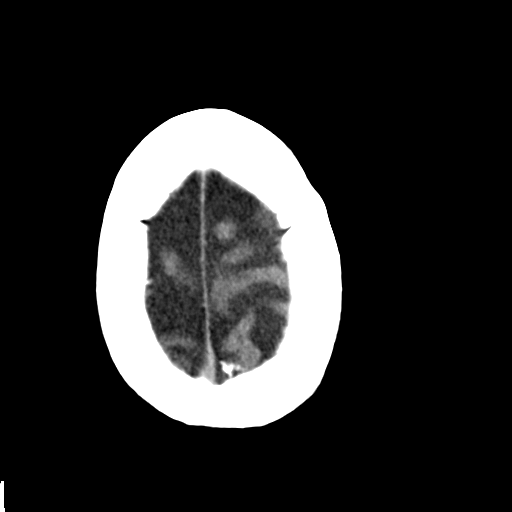
[im 27/31  brain]
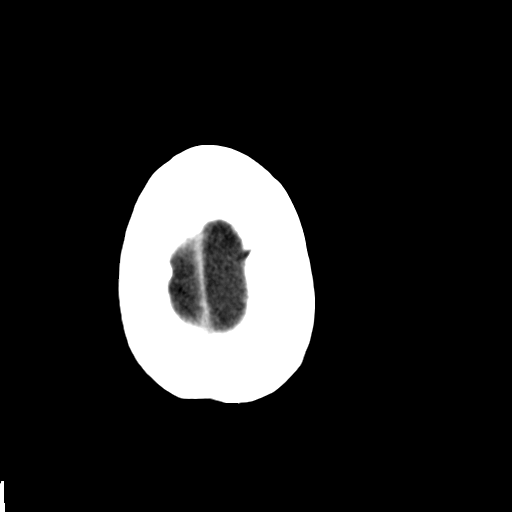
[im 27/31  bone]
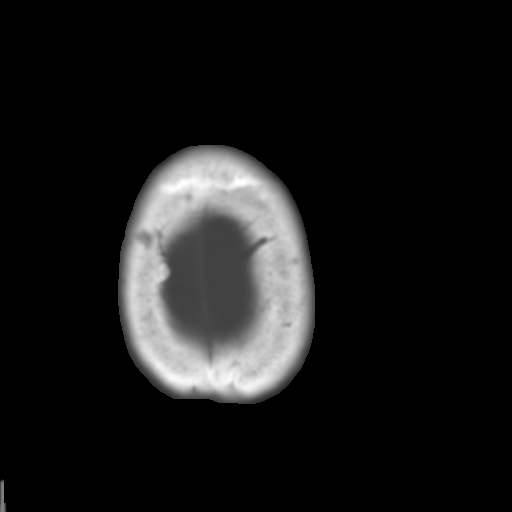
[im 29/31  brain]
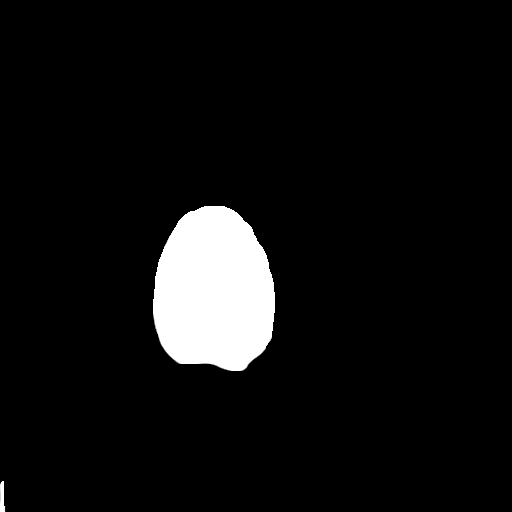

[14 of 30 positions shown; findings below may reference images not displayed]

FINDINGS: Diffuse cerebral atrophy. No ventricular dilatation. Patchy
low-attenuation changes in the periventricular white matter
consistent with small vessel ischemia. No mass effect or midline
shift. No abnormal extra-axial fluid collections. Gray-white matter
junctions are distinct. Basal cisterns are not effaced. No evidence
of acute intracranial hemorrhage. No depressed skull fractures.
Mucous retention cyst in the sphenoid sinus. Paranasal sinuses and
mastoid air cells are otherwise clear.
IMPRESSION: No acute intracranial abnormalities. Mild chronic atrophy and small
vessel ischemic changes.

## 2017-10-27 DIAGNOSIS — K5903 Drug induced constipation: Secondary | ICD-10-CM | POA: Diagnosis not present

## 2017-10-27 DIAGNOSIS — S76312A Strain of muscle, fascia and tendon of the posterior muscle group at thigh level, left thigh, initial encounter: Secondary | ICD-10-CM | POA: Diagnosis not present

## 2017-10-27 DIAGNOSIS — M5416 Radiculopathy, lumbar region: Secondary | ICD-10-CM | POA: Diagnosis not present

## 2017-10-27 DIAGNOSIS — M519 Unspecified thoracic, thoracolumbar and lumbosacral intervertebral disc disorder: Secondary | ICD-10-CM | POA: Diagnosis not present

## 2017-10-27 DIAGNOSIS — Q675 Congenital deformity of spine: Secondary | ICD-10-CM | POA: Diagnosis not present

## 2017-10-27 DIAGNOSIS — Z79899 Other long term (current) drug therapy: Secondary | ICD-10-CM | POA: Diagnosis not present

## 2017-10-30 DIAGNOSIS — J449 Chronic obstructive pulmonary disease, unspecified: Secondary | ICD-10-CM | POA: Diagnosis not present

## 2017-11-03 DIAGNOSIS — G603 Idiopathic progressive neuropathy: Secondary | ICD-10-CM | POA: Diagnosis not present

## 2017-11-03 DIAGNOSIS — R600 Localized edema: Secondary | ICD-10-CM | POA: Diagnosis not present

## 2017-11-03 DIAGNOSIS — M79671 Pain in right foot: Secondary | ICD-10-CM | POA: Diagnosis not present

## 2017-11-03 DIAGNOSIS — M79672 Pain in left foot: Secondary | ICD-10-CM | POA: Diagnosis not present

## 2017-11-05 DIAGNOSIS — I890 Lymphedema, not elsewhere classified: Secondary | ICD-10-CM | POA: Diagnosis not present

## 2017-11-05 DIAGNOSIS — M180 Bilateral primary osteoarthritis of first carpometacarpal joints: Secondary | ICD-10-CM | POA: Diagnosis not present

## 2017-11-05 DIAGNOSIS — E7800 Pure hypercholesterolemia, unspecified: Secondary | ICD-10-CM | POA: Diagnosis not present

## 2017-11-05 DIAGNOSIS — I10 Essential (primary) hypertension: Secondary | ICD-10-CM | POA: Diagnosis not present

## 2017-11-05 DIAGNOSIS — Z9889 Other specified postprocedural states: Secondary | ICD-10-CM | POA: Diagnosis not present

## 2017-11-05 DIAGNOSIS — G4733 Obstructive sleep apnea (adult) (pediatric): Secondary | ICD-10-CM | POA: Diagnosis not present

## 2017-11-05 DIAGNOSIS — I272 Pulmonary hypertension, unspecified: Secondary | ICD-10-CM | POA: Diagnosis not present

## 2017-11-07 DIAGNOSIS — M5442 Lumbago with sciatica, left side: Secondary | ICD-10-CM | POA: Diagnosis not present

## 2017-11-07 DIAGNOSIS — G8929 Other chronic pain: Secondary | ICD-10-CM | POA: Diagnosis not present

## 2017-11-07 DIAGNOSIS — Z96653 Presence of artificial knee joint, bilateral: Secondary | ICD-10-CM | POA: Diagnosis not present

## 2017-11-10 DIAGNOSIS — T300 Burn of unspecified body region, unspecified degree: Secondary | ICD-10-CM | POA: Diagnosis not present

## 2017-11-10 DIAGNOSIS — N390 Urinary tract infection, site not specified: Secondary | ICD-10-CM | POA: Diagnosis not present

## 2017-11-10 DIAGNOSIS — M5489 Other dorsalgia: Secondary | ICD-10-CM | POA: Diagnosis not present

## 2017-11-10 DIAGNOSIS — R300 Dysuria: Secondary | ICD-10-CM | POA: Diagnosis not present

## 2017-11-11 ENCOUNTER — Ambulatory Visit
Admission: RE | Admit: 2017-11-11 | Discharge: 2017-11-11 | Disposition: A | Discharge status: Home / Self Care | Payer: PPO | Source: Ambulatory Visit | Attending: Internal Medicine | Admitting: Internal Medicine

## 2017-11-11 DIAGNOSIS — Z1231 Encounter for screening mammogram for malignant neoplasm of breast: Secondary | ICD-10-CM | POA: Diagnosis not present

## 2017-11-28 DIAGNOSIS — J449 Chronic obstructive pulmonary disease, unspecified: Secondary | ICD-10-CM | POA: Diagnosis not present

## 2017-12-02 DIAGNOSIS — B9629 Other Escherichia coli [E. coli] as the cause of diseases classified elsewhere: Secondary | ICD-10-CM | POA: Diagnosis not present

## 2017-12-02 DIAGNOSIS — N3010 Interstitial cystitis (chronic) without hematuria: Secondary | ICD-10-CM | POA: Diagnosis not present

## 2017-12-02 DIAGNOSIS — N390 Urinary tract infection, site not specified: Secondary | ICD-10-CM | POA: Diagnosis not present

## 2017-12-10 DIAGNOSIS — R399 Unspecified symptoms and signs involving the genitourinary system: Secondary | ICD-10-CM | POA: Diagnosis not present

## 2017-12-16 ENCOUNTER — Ambulatory Visit (INDEPENDENT_AMBULATORY_CARE_PROVIDER_SITE_OTHER): Payer: PPO | Admitting: Vascular Surgery

## 2017-12-18 DIAGNOSIS — R32 Unspecified urinary incontinence: Secondary | ICD-10-CM | POA: Diagnosis not present

## 2017-12-18 DIAGNOSIS — N3010 Interstitial cystitis (chronic) without hematuria: Secondary | ICD-10-CM | POA: Diagnosis not present

## 2017-12-18 DIAGNOSIS — N390 Urinary tract infection, site not specified: Secondary | ICD-10-CM | POA: Diagnosis not present

## 2017-12-22 ENCOUNTER — Ambulatory Visit: Payer: PPO | Attending: Orthopedic Surgery | Admitting: Physical Therapy

## 2017-12-22 ENCOUNTER — Other Ambulatory Visit: Payer: Self-pay

## 2017-12-22 ENCOUNTER — Encounter: Payer: Self-pay | Admitting: Physical Therapy

## 2017-12-22 DIAGNOSIS — M6281 Muscle weakness (generalized): Secondary | ICD-10-CM

## 2017-12-22 DIAGNOSIS — R262 Difficulty in walking, not elsewhere classified: Secondary | ICD-10-CM | POA: Diagnosis not present

## 2017-12-22 DIAGNOSIS — M25562 Pain in left knee: Secondary | ICD-10-CM | POA: Diagnosis not present

## 2017-12-22 NOTE — Therapy (Signed)
[place] MAIN [facility] SERVICES [address] [place], [place], [address] Phone: [phone]   Fax:  [phone]  Physical Therapy Evaluation  Patient Details  Name: Kimberly Roy MRN: [id number] Date of Birth: 05/10/1931 (84) Referring Provider: Dereck Leep   Encounter Date: 12/22/2017  PT End of Session - 12/22/17 1627    Visit Number  1    Number of Visits  25    Date for PT Re-Evaluation  03/16/18    PT Start Time  0400    PT Stop Time  0500    PT Time Calculation (min)  60 min    Equipment Utilized During Treatment  Gait belt    Activity Tolerance  Patient tolerated treatment well;Patient limited by fatigue;Patient limited by pain    Behavior During Therapy  [facility] for tasks assessed/performed       Past Medical History:  Diagnosis Date  . Anemia   . Anxiety   . Chest pain   . CHF (congestive heart failure) ([place])   . Constipation   . DDD (degenerative disc disease), cervical   . Depression   . DVT (deep venous thrombosis) ([place])   . Dysphonia   . Dyspnea   . Fatty liver   . Fatty liver   . Headache   . Hyperlipidemia   . Hyperpiesia   . Hypertension   . Hypothyroidism   . Interstitial cystitis   . Left ventricular dysfunction   . Lymphedema   . Nephrolithiasis   . Obstructive sleep apnea   . Osteoarthritis    knees/cervical and lumbar spine  . Pulmonary hypertension ([place])   . Pulmonary nodules    followed by Dr Raul Del  . Pure hypercholesterolemia   . Renal cyst    right    Past Surgical History:  Procedure Laterality Date  . ABDOMINAL HYSTERECTOMY     ovaries left in place  . APPENDECTOMY    . Back Surgeries    . BREAST REDUCTION SURGERY     3/99  . CARDIAC CATHETERIZATION    . cataracts Bilateral   . CERVICAL SPINE SURGERY    . ESOPHAGEAL MANOMETRY N/A 08/02/2015   Procedure: ESOPHAGEAL MANOMETRY (EM);  Surgeon: Josefine Class, MD;  Location: [facility] [place] [facility] ENDOSCOPY;  Service: Endoscopy;  Laterality: N/A;  .  ESOPHAGOGASTRODUODENOSCOPY N/A 02/27/2015   Procedure: ESOPHAGOGASTRODUODENOSCOPY (EGD);  Surgeon: Hulen Luster, MD;  Location: [facility] ENDOSCOPY;  Service: Gastroenterology;  Laterality: N/A;  . EXCISIONAL HEMORRHOIDECTOMY    . HEMORRHOID SURGERY    . HIP SURGERY  2013   Right hip surgery  . KNEE ARTHROSCOPY     left and right  . REDUCTION MAMMAPLASTY Bilateral YRS AGO  . REPLACEMENT TOTAL KNEE Bilateral   . rotator cuff surgery     blilateral  . TONSILECTOMY/ADENOIDECTOMY WITH MYRINGOTOMY      There were no vitals filed for this visit.   Subjective Assessment - 12/22/17 1618    Subjective  Patient is having difficulty with her walking and her left hamstring is painful.     Patient is accompained by:  Family member    Pertinent History  Patient has been having left knee pain that began after the knee was replaced 4 years ago. She has been using a rollator for 4 years  and she is getting worse with her ambulation. She is having pain in her left knee 8/10 .Marland Kitchen She is taking morphine and hydrocodone for her back pain. She has had 5 back  operations . She is trying to stop taking the morphine since 1989 . She had physical therapy  4 years ago .     Limitations  Sitting    How long can you stand comfortably?  less than 5 mins    How long can you walk comfortably?  5-10 mins    Patient Stated Goals  She wants to be able to walk better and get exercise. She wants to stop using the rollator if possible and she has had it for 6 years.     Currently in Pain?  Yes    Pain Score  8     Pain Location  Knee    Pain Orientation  Left    Pain Descriptors / Indicators  Aching;Burning    Pain Type  Chronic pain    Pain Onset  More than a month ago    Pain Frequency  Constant    Aggravating Factors   walking    Pain Relieving Factors  pain medicine    Effect of Pain on Daily Activities  standing and walking is limited to 5 mins         [facility] PT Assessment - 12/22/17 1628      Assessment   Medical  Diagnosis  left hamstring pain    Referring Provider  Skip Estimable P    Onset Date/Surgical Date  12/02/17    Hand Dominance  Right      Precautions   Precautions  None      Restrictions   Weight Bearing Restrictions  No      Balance Screen   Has the patient fallen in the past 6 months  No    Has the patient had a decrease in activity level because of a fear of falling?   Yes    Is the patient reluctant to leave their home because of a fear of falling?   No      Home Environment   Living Environment  Private residence    Living Arrangements  Spouse/significant other;Children    Available Help at Discharge  Family    Type of [place] to enter    Entrance Stairs-Number of Steps  2    Entrance Stairs-Rails  Right;Left;Can reach both    [place]  One level    [place] - 4 wheels;Grab bars - tub/shower;Grab bars - toilet;Shower seat - built in      Prior Function   Level of Independence  Independent with basic ADLs;Independent with household mobility with device    Leisure  TV,         POSTURE: flexed posture   PROM/AROM: left knee -15 degs extension  STRENGTH:  Graded on a 0-5 scale Muscle Group Left Right                          Hip Flex 3+/5 4/5  Hip Abd 3+/5 4/5  Hip Add NT NT  Hip Ext NT NT  Hip IR/ER NT NT  Knee Flex 4/5 4/5  Knee Ext 4/5 4/5  Ankle DF 4/5 4/5  Ankle PF NT NT   SENSATION: WNL BLE   FUNCTIONAL MOBILITY  patient is not able to lie down flat and sleeps in a recliner Patient transfers with UE support and rollator    BALANCE  Static Standing Balance  Normal Able to maintain standing balance against maximal resistance  Good Able to maintain standing balance against moderate resistance   Good-/Fair+ Able to maintain standing balance against minimal resistance   Fair Able to stand unsupported without UE support and without LOB for 1-2 min   Fair- Requires Min A and UE support to maintain  standing without loss of balance X  Poor+ Requires mod A and UE support to maintain standing without loss of balance   Poor Requires max A and UE support to maintain standing balance without loss    Standing Dynamic Balance  Normal Stand independently unsupported, able to weight shift and cross midline maximally   Good Stand independently unsupported, able to weight shift and cross midline moderately   Good-/Fair+ Stand independently unsupported, able to weight shift across midline minimally   Fair Stand independently unsupported, weight shift, and reach ipsilaterally, loss of balance when crossing midline   Poor+ Able to stand with Min A and reach ipsilaterally, unable to weight shift   Poor Able to stand with Mod A and minimally reach ipsilaterally, unable to cross midline. X     GAIT: Patient ambulates with rollator and decreased gait speed , antalgic gait with flexed left knee during stance phase.  OUTCOME MEASURES: TEST Outcome Interpretation  5 times sit<>stand 24.75sec >37 yo, >15 sec indicates increased risk for falls  10 meter walk test     .44 m/sec            m/s <1.0 m/s indicates increased risk for falls; limited community ambulator  Timed up and Go       26.14          sec <14 sec indicates increased risk for falls                          Objective measurements completed on examination: See above findings.              PT Education - 12/22/17 1626    Education provided  Yes    Education Details  POC    Person(s) Educated  Patient    Methods  Explanation    Comprehension  Verbalized understanding       PT Short Term Goals - 12/23/17 1027      PT SHORT TERM GOAL #1   Title  Patient will be independent in home exercise program to improve strength/mobility for better functional independence with ADLs.    Time  4    Period  Weeks    Status  New    Target Date  01/19/18      PT SHORT TERM GOAL #2   Title  Patient (> 58 years old) will  complete five times sit to stand test in < 15 seconds indicating an increased LE strength and improved balance.    Time  4    Period  Weeks    Target Date  01/19/18        PT Long Term Goals - 12/23/17 1029      PT LONG TERM GOAL #1   Title  Patient will increase 10 meter walk test to >1.66ms as to improve gait speed for better community ambulation and to reduce fall risk.    Time  8    Period  Weeks    Status  New    Target Date  02/16/18      PT LONG TERM GOAL #2   Title  Patient will reduce timed up and go to <11 seconds to  reduce fall risk and demonstrate improved transfer/gait ability.    Time  8    Period  Weeks    Status  New    Target Date  02/16/18      PT LONG TERM GOAL #3   Title  Patient will report a worst pain of 3/10 on VAS in  left knee to improve tolerance with ADLs and reduced symptoms with activities.     Baseline  8/10 left knee    Time  8    Period  Weeks    Status  New    Target Date  02/16/18      PT LONG TERM GOAL #4   Title  Patient will be able to to ambulate with rollator for house hold distances and intermediate distances greater than 200 feet with less than 5/10 pain to left knee.     Time  8    Period  Weeks    Status  New    Target Date  02/16/18             Plan - 12/22/17 1753    Clinical Impression Statement  Patient has 8/10 pain and tenderness to palpation to left hamstring. She is not able to ambulate intermediate or long distances and has been having a decline in ability to ambulate over the past few months. She has deceased ROM in right knee extension - 15 degs. She has decreased functional transfers with definite need of UE for safe transfers. She ambulates with rollator short distnaces with shuffling gait with decreased gait speed. She has decreased flexibiity in LLE knee. She has a fear of falling after falling and having a left hip fracture in the past. She will benefit from skilled PT to improve flexibility in left knee,  decrease pain in left knee and improve gait and quality of life.     Clinical Presentation  Stable    Clinical Decision Making  Low    Rehab Potential  Good    PT Frequency  2x / week    PT Duration  8 weeks    PT Treatment/Interventions  Dry needling;Passive range of motion;Manual techniques;Patient/family education;Ultrasound;Cryotherapy;Electrical Stimulation;Moist Heat;Gait training;Functional mobility training;Therapeutic activities;Therapeutic exercise    PT Next Visit Plan  MH, [place], e-stim    PT Home Exercise Plan  ice     Consulted and Agree with Plan of Care  Patient       Patient will benefit from skilled therapeutic intervention in order to improve the following deficits and impairments:  Abnormal gait, Decreased balance, Decreased endurance, Decreased mobility, Difficulty walking, Pain, Postural dysfunction, Impaired flexibility, Decreased strength, Decreased activity tolerance  Visit Diagnosis: Left knee pain, unspecified chronicity  Muscle weakness (generalized)  Difficulty in walking, not elsewhere classified     Problem List Patient Active Problem List   Diagnosis Date Noted  . Right shoulder pain 05/16/2017  . Chronic venous insufficiency 02/10/2017  . Lymphedema 02/10/2017  . Neuropathy 12/25/2016  . Anemia due to blood loss, chronic 08/07/2016  . GERD (gastroesophageal reflux disease) 12/10/2015  . Finger pain 11/13/2015  . Carotid artery calcification 09/26/2015  . External nasal lesion 09/25/2015  . Muscle cramps 09/01/2015  . Excessive sweating 07/30/2015  . Headache 07/16/2015  . Groin pain 05/14/2015  . Muscle twitching 03/05/2015  . Leg cramps 02/11/2015  . Back pain 02/11/2015  . Abdominal pain 02/11/2015  . Acute cystitis without hematuria 12/12/2014  . Left elbow pain 11/30/2014  . Health care maintenance 11/30/2014  .  Osteoporosis 10/19/2014  . Rectal bleeding 10/19/2014  . BMI 33.0-33.9,adult 09/03/2014  . Neck pain 09/03/2014  .  Unsteady gait 09/03/2014  . Nocturia 09/03/2014  . Dysphagia 06/01/2014  . Nasal dryness 06/01/2014  . Stress 06/01/2014  . Fatigue 02/27/2014  . Pre-op evaluation 10/12/2013  . Hoarseness 08/14/2013  . Leg swelling 01/24/2013  . CHF (congestive heart failure) ([place]) 12/29/2012  . Cough 12/29/2012  . OSA (obstructive sleep apnea) 07/09/2012  . Osteoarthritis 07/04/2012  . Anemia 07/04/2012  . Chronic constipation 07/04/2012  . Pulmonary hypertension ([place]) 07/04/2012  . Pulmonary nodules 07/04/2012  . Hypertension 07/04/2012  . Hypercholesterolemia 07/04/2012  . Hypothyroidism 07/04/2012    Alanson Puls, PT DPT 12/23/2017, 10:35 AM  [place] MAIN [facility] SERVICES [place], [place], [address] Phone: [phone]   Fax:  [phone]  Name: SAKEENAH VALCARCEL MRN: [id number] Date of Birth: 10-12-1930

## 2017-12-29 ENCOUNTER — Ambulatory Visit: Payer: PPO | Admitting: Physical Therapy

## 2017-12-29 ENCOUNTER — Encounter: Payer: Self-pay | Admitting: Physical Therapy

## 2017-12-29 DIAGNOSIS — M25562 Pain in left knee: Secondary | ICD-10-CM

## 2017-12-29 DIAGNOSIS — M6281 Muscle weakness (generalized): Secondary | ICD-10-CM

## 2017-12-29 DIAGNOSIS — J449 Chronic obstructive pulmonary disease, unspecified: Secondary | ICD-10-CM | POA: Diagnosis not present

## 2017-12-29 DIAGNOSIS — R262 Difficulty in walking, not elsewhere classified: Secondary | ICD-10-CM

## 2017-12-29 NOTE — Therapy (Signed)
[place] MAIN [facility] SERVICES [address] [place], [place], [address] Phone: [phone]   Fax:  [phone]  Physical Therapy Treatment  Patient Details  Name: Kimberly Roy MRN: [id number] Date of Birth: 1931-07-19 Referring Provider: Dereck Leep   Encounter Date: 12/29/2017  PT End of Session - 12/29/17 1203    Visit Number  2    Number of Visits  25    Date for PT Re-Evaluation  03/16/18    PT Start Time  1150    PT Stop Time  1230    PT Time Calculation (min)  40 min    Equipment Utilized During Treatment  Gait belt    Activity Tolerance  Patient tolerated treatment well;Patient limited by fatigue;Patient limited by pain    Behavior During Therapy  [facility] for tasks assessed/performed       Past Medical History:  Diagnosis Date  . Anemia   . Anxiety   . Chest pain   . CHF (congestive heart failure) ([place])   . Constipation   . DDD (degenerative disc disease), cervical   . Depression   . DVT (deep venous thrombosis) ([place])   . Dysphonia   . Dyspnea   . Fatty liver   . Fatty liver   . Headache   . Hyperlipidemia   . Hyperpiesia   . Hypertension   . Hypothyroidism   . Interstitial cystitis   . Left ventricular dysfunction   . Lymphedema   . Nephrolithiasis   . Obstructive sleep apnea   . Osteoarthritis    knees/cervical and lumbar spine  . Pulmonary hypertension ([place])   . Pulmonary nodules    followed by Dr Raul Del  . Pure hypercholesterolemia   . Renal cyst    right    Past Surgical History:  Procedure Laterality Date  . ABDOMINAL HYSTERECTOMY     ovaries left in place  . APPENDECTOMY    . Back Surgeries    . BREAST REDUCTION SURGERY     3/99  . CARDIAC CATHETERIZATION    . cataracts Bilateral   . CERVICAL SPINE SURGERY    . ESOPHAGEAL MANOMETRY N/A 08/02/2015   Procedure: ESOPHAGEAL MANOMETRY (EM);  Surgeon: Josefine Class, MD;  Location: [facility] ENDOSCOPY;  Service: Endoscopy;  Laterality: N/A;  .  ESOPHAGOGASTRODUODENOSCOPY N/A 02/27/2015   Procedure: ESOPHAGOGASTRODUODENOSCOPY (EGD);  Surgeon: Hulen Luster, MD;  Location: [facility] ENDOSCOPY;  Service: Gastroenterology;  Laterality: N/A;  . EXCISIONAL HEMORRHOIDECTOMY    . HEMORRHOID SURGERY    . HIP SURGERY  2013   Right hip surgery  . KNEE ARTHROSCOPY     left and right  . REDUCTION MAMMAPLASTY Bilateral YRS AGO  . REPLACEMENT TOTAL KNEE Bilateral   . rotator cuff surgery     blilateral  . TONSILECTOMY/ADENOIDECTOMY WITH MYRINGOTOMY      There were no vitals filed for this visit.  Subjective Assessment - 12/29/17 1158    Subjective  Patient is having difficulty with her walking and her left hamstring is painful.     Patient is accompained by:  Family member    Pertinent History  Patient has been having left knee pain that began after the knee was replaced 4 years ago. She has been using a rollator for 4 years  and she is getting worse with her ambulation. She is having pain in her left knee 8/10 .Marland Kitchen She is taking morphine and hydrocodone for her back pain. She has had 5 back operations .  She is trying to stop taking the morphine since 1989 . She had physical therapy  4 years ago .     Limitations  Sitting    How long can you stand comfortably?  less than 5 mins    How long can you walk comfortably?  5-10 mins    Patient Stated Goals  She wants to be able to walk better and get exercise. She wants to stop using the rollator if possible and she has had it for 6 years.     Pain Score  8     Pain Location  Knee    Pain Orientation  Left    Pain Descriptors / Indicators  Aching;Burning    Pain Type  Chronic pain    Pain Onset  More than a month ago    Aggravating Factors   walking    Pain Relieving Factors  pain medicine    Effect of Pain on Daily Activities  standing is difficult    Multiple Pain Sites  No       Treatment: MH to left knee hamstring x 10 mins with stretching to left hamstring in supine with wedge under her back due  to inability to lie supine  Seated left knee stretch x 30 sec x 3  Quad sets with 3 sec hold  Standing knee extension following manual therapy   Manual therapy: Roller stick to hamstring x 10 mins   STM to Left  hamstring to decrease spasm and pain   Patient has less pain following therapy and decreases to 6/10. Patient is able to stand longer with less pain following treatment.                   PT Education - 12/29/17 1158    Education provided  Yes    Education Details  heat and stretching    Person(s) Educated  Patient    Methods  Explanation;Demonstration    Comprehension  Verbalized understanding;Returned demonstration       PT Short Term Goals - 12/23/17 1027      PT SHORT TERM GOAL #1   Title  Patient will be independent in home exercise program to improve strength/mobility for better functional independence with ADLs.    Time  4    Period  Weeks    Status  New    Target Date  01/19/18      PT SHORT TERM GOAL #2   Title  Patient (> 44 years old) will complete five times sit to stand test in < 15 seconds indicating an increased LE strength and improved balance.    Time  4    Period  Weeks    Target Date  01/19/18        PT Long Term Goals - 12/23/17 1029      PT LONG TERM GOAL #1   Title  Patient will increase 10 meter walk test to >1.9ms as to improve gait speed for better community ambulation and to reduce fall risk.    Time  8    Period  Weeks    Status  New    Target Date  02/16/18      PT LONG TERM GOAL #2   Title  Patient will reduce timed up and go to <11 seconds to reduce fall risk and demonstrate improved transfer/gait ability.    Time  8    Period  Weeks    Status  New    Target Date  02/16/18      PT LONG TERM GOAL #3   Title  Patient will report a worst pain of 3/10 on VAS in  left knee to improve tolerance with ADLs and reduced symptoms with activities.     Baseline  8/10 left knee    Time  8    Period  Weeks     Status  New    Target Date  02/16/18      PT LONG TERM GOAL #4   Title  Patient will be able to to ambulate with rollator for house hold distances and intermediate distances greater than 200 feet with less than 5/10 pain to left knee.     Time  8    Period  Weeks    Status  New    Target Date  02/16/18            Plan - 12/29/17 1203    Clinical Impression Statement  Patient continues to have decrease ROM to left knee and lacks terminal knee extension. She has weakness in left knee and is not able to stand  for longer than 1 min due to weakness and instability. She was seen for  heat and stretching to left knee with over pressure and weights. She was educated on HEP for stretching and strengthening of left knee. Patient will conitnue to benefit from skilled PT to improve strength and mobility .    Rehab Potential  Good    PT Frequency  2x / week    PT Duration  8 weeks    PT Treatment/Interventions  Dry needling;Passive range of motion;Manual techniques;Patient/family education;Ultrasound;Cryotherapy;Electrical Stimulation;Moist Heat;Gait training;Functional mobility training;Therapeutic activities;Therapeutic exercise    PT Next Visit Plan  MH, [place], e-stim    PT Home Exercise Plan  ice     Consulted and Agree with Plan of Care  Patient       Patient will benefit from skilled therapeutic intervention in order to improve the following deficits and impairments:  Abnormal gait, Decreased balance, Decreased endurance, Decreased mobility, Difficulty walking, Pain, Postural dysfunction, Impaired flexibility, Decreased strength, Decreased activity tolerance  Visit Diagnosis: Left knee pain, unspecified chronicity  Muscle weakness (generalized)  Difficulty in walking, not elsewhere classified     Problem List Patient Active Problem List   Diagnosis Date Noted  . Right shoulder pain 05/16/2017  . Chronic venous insufficiency 02/10/2017  . Lymphedema 02/10/2017  . Neuropathy  12/25/2016  . Anemia due to blood loss, chronic 08/07/2016  . GERD (gastroesophageal reflux disease) 12/10/2015  . Finger pain 11/13/2015  . Carotid artery calcification 09/26/2015  . External nasal lesion 09/25/2015  . Muscle cramps 09/01/2015  . Excessive sweating 07/30/2015  . Headache 07/16/2015  . Groin pain 05/14/2015  . Muscle twitching 03/05/2015  . Leg cramps 02/11/2015  . Back pain 02/11/2015  . Abdominal pain 02/11/2015  . Acute cystitis without hematuria 12/12/2014  . Left elbow pain 11/30/2014  . Health care maintenance 11/30/2014  . Osteoporosis 10/19/2014  . Rectal bleeding 10/19/2014  . BMI 33.0-33.9,adult 09/03/2014  . Neck pain 09/03/2014  . Unsteady gait 09/03/2014  . Nocturia 09/03/2014  . Dysphagia 06/01/2014  . Nasal dryness 06/01/2014  . Stress 06/01/2014  . Fatigue 02/27/2014  . Pre-op evaluation 10/12/2013  . Hoarseness 08/14/2013  . Leg swelling 01/24/2013  . CHF (congestive heart failure) ([place]) 12/29/2012  . Cough 12/29/2012  . OSA (obstructive sleep apnea) 07/09/2012  . Osteoarthritis 07/04/2012  . Anemia 07/04/2012  . Chronic constipation 07/04/2012  .  Pulmonary hypertension ([place]) 07/04/2012  . Pulmonary nodules 07/04/2012  . Hypertension 07/04/2012  . Hypercholesterolemia 07/04/2012  . Hypothyroidism 07/04/2012    Alanson Puls, PT DPT 12/29/2017, 12:07 PM  [place] MAIN [facility] SERVICES [address] [place], [place], [address] Phone: [phone]   Fax:  [phone]  Name: Kimberly Roy MRN: [id number] Date of Birth: 16-Jan-1931

## 2017-12-30 ENCOUNTER — Ambulatory Visit: Payer: PPO | Admitting: Physical Therapy

## 2017-12-30 DIAGNOSIS — M25511 Pain in right shoulder: Secondary | ICD-10-CM | POA: Diagnosis not present

## 2017-12-30 DIAGNOSIS — M19011 Primary osteoarthritis, right shoulder: Secondary | ICD-10-CM | POA: Diagnosis not present

## 2018-01-01 ENCOUNTER — Encounter: Payer: PPO | Admitting: Physical Therapy

## 2018-01-02 ENCOUNTER — Encounter (INDEPENDENT_AMBULATORY_CARE_PROVIDER_SITE_OTHER): Payer: Self-pay | Admitting: Vascular Surgery

## 2018-01-02 ENCOUNTER — Other Ambulatory Visit: Payer: Self-pay | Admitting: Internal Medicine

## 2018-01-02 ENCOUNTER — Ambulatory Visit (INDEPENDENT_AMBULATORY_CARE_PROVIDER_SITE_OTHER): Payer: PPO | Admitting: Vascular Surgery

## 2018-01-02 VITALS — BP 183/69 | HR 67 | Resp 16 | Ht 62.0 in | Wt 189.8 lb

## 2018-01-02 DIAGNOSIS — I10 Essential (primary) hypertension: Secondary | ICD-10-CM

## 2018-01-02 DIAGNOSIS — I509 Heart failure, unspecified: Secondary | ICD-10-CM | POA: Diagnosis not present

## 2018-01-02 DIAGNOSIS — I890 Lymphedema, not elsewhere classified: Secondary | ICD-10-CM | POA: Diagnosis not present

## 2018-01-02 NOTE — Assessment & Plan Note (Signed)
blood pressure control important in reducing the progression of atherosclerotic disease. On appropriate oral medications.

## 2018-01-02 NOTE — Assessment & Plan Note (Signed)
Compression and elevation have done a great job of controlling her swelling.  This remains reasonably mild.  At this point, she can use the lymphedema pump as needed.  I will see her back as needed.

## 2018-01-02 NOTE — Progress Notes (Signed)
MRN : [id number]  Kimberly Roy is a 84 y.o. (1931-08-20) female who presents with chief complaint of  Chief Complaint  Patient presents with  . Follow-up    63monthfollow up  .  History of Present Illness: Patient returns today in follow up of her leg swelling.  She has been diligently wearing her compression stockings and elevating her legs.  As a result, her swelling continues to be well controlled.  She has not really needed to use her lymphedema pump because her swelling has remained controlled.  No fevers or chills.  No ulceration or infection.  She has had some intermittent bouts of shooting pain down her leg into her toes.  We discussed that this is likely neuropathic pain.  Current Outpatient Medications  Medication Sig Dispense Refill  . aluminum-magnesium hydroxide-simethicone (MAALOX) 2048-889-16MG/5ML SUSP Take 15 mLs by mouth 4 (four) times daily -  before meals and at bedtime.     .Marland KitchenamLODipine (NORVASC) 10 MG tablet TAKE 1 TABLET BY MOUTH DAILY 90 tablet 1  . atorvastatin (LIPITOR) 20 MG tablet TAKE 1 TABLET BY MOUTH EVERY NIGHT AT BEDTIME 90 tablet 1  . benazepril (LOTENSIN) 40 MG tablet TAKE 1 TABLET BY MOUTH DAILY 90 tablet 1  . Calcium-Magnesium-Vitamin D 600-40-500 MG-MG-UNIT TB24 Take 630 mg by mouth 4 (four) times daily.    .Sarajane MarekSodium 30-100 MG CAPS Take 1 capsule by mouth daily.     . cephALEXin (KEFLEX) 250 MG capsule Take 250 mg by mouth daily.     . Cholecalciferol (VITAMIN D3) 1000 UNITS CAPS Take by mouth.    . citalopram (CELEXA) 20 MG tablet Take 20 mg by mouth daily.     . DULoxetine (CYMBALTA) 60 MG capsule TAKE 1 CAPSULE BY MOUTH EVERY DAY. 30 capsule 3  . FLUoxetine (PROZAC) 10 MG capsule Take 10 mg by mouth daily.    . fluticasone (VERAMYST) 27.5 MCG/SPRAY nasal spray Place 1 spray into the nose daily. 10 g 0  . gabapentin (NEURONTIN) 300 MG capsule Take 300 mg by mouth 3 (three) times daily.     . hydrochlorothiazide (HYDRODIURIL) 25  MG tablet TAKE 1 TABLET BY MOUTH EVERY DAY. 90 tablet 1  . HYDROcodone-acetaminophen (NORCO) 10-325 MG per tablet Take 1 tablet by mouth 3 (three) times daily.     . Inulin (FIBER CHOICE PO) Take 2 tablets by mouth daily. Chew tablets    . magnesium oxide (MAG-OX) 400 MG tablet Take 1 tablet (400 mg total) by mouth daily. 30 tablet 1  . metoprolol succinate (TOPROL-XL) 100 MG 24 hr tablet TAKE 1 TABLET BY MOUTH TWICE A DAY WITH OR IMMEDIATELY FOLLOWING A MEAL 180 tablet 2  . morphine (MS CONTIN) 30 MG 12 hr tablet Take 30 mg by mouth daily. Takes around midday    . morphine (MS CONTIN) 60 MG 12 hr tablet Take 60 mg by mouth 2 (two) times daily.     . mupirocin ointment (BACTROBAN) 2 % Apply to affected area on back bid 22 g 0  . Naldemedine Tosylate (SYMPROIC) 0.2 MG TABS Take 0.2 mg by mouth daily.    .Marland Kitchennystatin (NYSTATIN) powder Apply topically 2 (two) times daily. 15 g 0  . nystatin cream (MYCOSTATIN) APPLY TO AFFECTED AREA TWICE A DAY 30 g 1  . pantoprazole (PROTONIX) 40 MG tablet TAKE 1 TABLET BY MOUTH TWICE A DAY. 60 tablet 3  . polyethylene glycol powder (GLYCOLAX/MIRALAX) powder MIX 17 GRAMS AS  MARKED IN BOTTLE TOP IN 8 OUNCES OF WATER MIX AND DRINK ONCE A DAY AS DIRECTED 527 g 3  . Probiotic Product (ALIGN PO) Take by mouth daily.    . ranitidine (ZANTAC) 150 MG tablet Take 150 mg by mouth 2 (two) times daily.    . sodium chloride (OCEAN) 0.65 % nasal spray Place 1 spray into the nose as needed.    Marland Kitchen SYNTHROID 112 MCG tablet TAKE 1 TABLET BY MOUTH DAILY. TAKE ON ANEMPTY STOMACH WITH A GLASS OFWATER 30-60 MINUTES BEFORE BREAKFAST 90 tablet 1  . triamcinolone cream (KENALOG) 0.1 %      No current facility-administered medications for this visit.     Past Medical History:  Diagnosis Date  . Anemia   . Anxiety   . Chest pain   . CHF (congestive heart failure) ([place])   . Constipation   . DDD (degenerative disc disease), cervical   . Depression   . DVT (deep venous thrombosis) ([place])    . Dysphonia   . Dyspnea   . Fatty liver   . Fatty liver   . Headache   . Hyperlipidemia   . Hyperpiesia   . Hypertension   . Hypothyroidism   . Interstitial cystitis   . Left ventricular dysfunction   . Lymphedema   . Nephrolithiasis   . Obstructive sleep apnea   . Osteoarthritis    knees/cervical and lumbar spine  . Pulmonary hypertension ([place])   . Pulmonary nodules    followed by Dr Raul Del  . Pure hypercholesterolemia   . Renal cyst    right    Past Surgical History:  Procedure Laterality Date  . ABDOMINAL HYSTERECTOMY     ovaries left in place  . APPENDECTOMY    . Back Surgeries    . BREAST REDUCTION SURGERY     3/99  . CARDIAC CATHETERIZATION    . cataracts Bilateral   . CERVICAL SPINE SURGERY    . ESOPHAGEAL MANOMETRY N/A 08/02/2015   Procedure: ESOPHAGEAL MANOMETRY (EM);  Surgeon: Josefine Class, MD;  Location: [facility] ENDOSCOPY;  Service: Endoscopy;  Laterality: N/A;  . ESOPHAGOGASTRODUODENOSCOPY N/A 02/27/2015   Procedure: ESOPHAGOGASTRODUODENOSCOPY (EGD);  Surgeon: Hulen Luster, MD;  Location: [facility] ENDOSCOPY;  Service: Gastroenterology;  Laterality: N/A;  . EXCISIONAL HEMORRHOIDECTOMY    . HEMORRHOID SURGERY    . HIP SURGERY  2013   Right hip surgery  . KNEE ARTHROSCOPY     left and right  . REDUCTION MAMMAPLASTY Bilateral YRS AGO  . REPLACEMENT TOTAL KNEE Bilateral   . rotator cuff surgery     blilateral  . TONSILECTOMY/ADENOIDECTOMY WITH MYRINGOTOMY       Social History     Social History  Substance Use Topics  . Smoking status: Never Smoker  . Smokeless tobacco: Never Used  . Alcohol use No    Family History      Family History  Problem Relation Age of Onset  . Heart disease Mother   . Stroke Mother   . Hypertension Mother   . Heart disease Father        myocardial infarction age 82  . Breast cancer Neg Hx          Allergies  Allergen Reactions  . Lyrica [Pregabalin] Swelling  . Atarax [Hydroxyzine]     jittery    . Dicyclomine Other (See Comments)    Abdominal bloating   . Hydroxyzine Hcl     jittery  . Levaquin [Levofloxacin] Swelling  .  Nucynta Er [Tapentadol Hcl Er] Other (See Comments)    Severe constipation   . Oxybutynin Other (See Comments)    Blurred vision  . Zoloft [Sertraline Hcl]     Severe headache  . Biaxin [Clarithromycin] Other (See Comments) and Rash    Pt does not remember Pt does not remember  . Sertraline Nausea And Vomiting    Severe headache Severe headache Other reaction(s): Headache Severe headache  . Sulfa Antibiotics Rash    Pt does not remember  . Tape Rash    [place] - redness  . Tapentadol Other (See Comments) and Rash    _0      REVIEW OF SYSTEMS (Negative unless checked)  Constitutional: _1 Weight loss  _2 Fever  _3 Chills Cardiac: _4 Chest pain   _5 Chest pressure   _6 Palpitations   _7 Shortness of breath when laying flat   _8 Shortness of breath at rest   _9 Shortness of breath with exertion. Vascular:  _10 Pain in legs with walking   _11 Pain in legs at rest   _12 Pain in legs when laying flat   _13 Claudication   _14 Pain in feet when walking  _15 Pain in feet at rest  _16 Pain in feet when laying flat   _17 History of DVT   _18 Phlebitis   _19 Swelling in legs   _20 Varicose veins   _21 Non-healing ulcers Pulmonary:   _22 Uses home oxygen   _23 Productive cough   _24 Hemoptysis   _25 Wheeze  _26 COPD   _27 Asthma Neurologic:  _28 Dizziness  _29 Blackouts   _30 Seizures   _31 History of stroke   _32 History of TIA  _33 Aphasia   _34 Temporary blindness   _35 Dysphagia   _36 Weakness or numbness in arms   _37 Weakness or numbness in legs Musculoskeletal:  _38 Arthritis   _39 Joint swelling   _40 Joint pain   _41 Low back pain Hematologic:  _42 Easy bruising  _43 Easy bleeding   _44 Hypercoagulable state   _45 Anemic   Gastrointestinal:  _46 Blood in stool   _47 Vomiting blood  _48 Gastroesophageal  reflux/heartburn   _49 Abdominal pain Genitourinary:  _50 Chronic kidney disease   _51 Difficult urination  _52 Frequent urination  _53 Burning with urination   _54 Hematuria Skin:  _55 Rashes   _56 Ulcers   _57 Wounds Psychological:  _58 History of anxiety   _59  History of major depression.      Physical Examination  BP (!) 183/69 (BP Location: Left Arm)   Pulse 67   Resp 16   Ht _60  (1.575 m)   Wt 86.1 kg (189 lb 12.8 oz)   BMI 34.71 kg/m  Gen:  WD/WN, NAD Head: [place]/AT, No temporalis wasting. Ear/Nose/Throat: Hearing grossly intact, nares w/o erythema or drainage Eyes: Conjunctiva clear. Sclera non-icteric Neck: Supple.  Trachea midline Pulmonary:  Good air movement, no use of accessory muscles.  Cardiac: irregular Vascular:  Vessel Right Left  Radial Palpable Palpable                          PT Palpable Palpable  DP Palpable Palpable   Gastrointestinal: soft, non-tender/non-distended. No guarding/reflex.  Musculoskeletal: M/S 5/5 throughout.  No deformity or atrophy. 1+ BLE edema. Neurologic: Sensation grossly intact in extremities.  Symmetrical.  Speech is fluent.  Psychiatric: Judgment intact, Mood & affect appropriate for pt's clinical situation. Dermatologic: No rashes or ulcers noted.  No cellulitis or open wounds.       Labs No results found for this or any previous visit (from the past 2160 hour(s)).  Radiology No results found.  Assessment/Plan CHF (congestive heart failure) Can  contribute to lower extremity swelling.  Hypercholesterolemia lipid control important in reducing the progression of atherosclerotic disease. Continue statin therapy   Hypertension blood pressure control important in reducing the progression of atherosclerotic disease. On appropriate oral medications.   Lymphedema Compression and elevation have done a great job of controlling her swelling.  This remains reasonably mild.  At this point, she can use the lymphedema pump as needed.  I  will see her back as needed.    Leotis Pain, MD  01/02/2018 3:34 PM    This note was created with Dragon medical transcription system.  Any errors from dictation are purely unintentional

## 2018-01-05 ENCOUNTER — Ambulatory Visit: Payer: PPO | Attending: Orthopedic Surgery | Admitting: Physical Therapy

## 2018-01-05 ENCOUNTER — Other Ambulatory Visit: Payer: Self-pay | Admitting: Internal Medicine

## 2018-01-05 ENCOUNTER — Encounter: Payer: Self-pay | Admitting: Physical Therapy

## 2018-01-05 DIAGNOSIS — R262 Difficulty in walking, not elsewhere classified: Secondary | ICD-10-CM

## 2018-01-05 DIAGNOSIS — M6281 Muscle weakness (generalized): Secondary | ICD-10-CM | POA: Diagnosis not present

## 2018-01-05 DIAGNOSIS — M25562 Pain in left knee: Secondary | ICD-10-CM | POA: Insufficient documentation

## 2018-01-05 NOTE — Therapy (Signed)
[place] MAIN [facility] SERVICES [address] [place], [place], [address] Phone: [phone]   Fax:  [phone]  Physical Therapy Treatment  Patient Details  Name: Kimberly Roy MRN: [id number] Date of Birth: 11/04/1930 Referring Provider: Dereck Leep   Encounter Date: 01/05/2018  PT End of Session - 01/05/18 1514    Visit Number  3    Number of Visits  25    Date for PT Re-Evaluation  03/16/18    PT Start Time  0307    PT Stop Time  0345    PT Time Calculation (min)  38 min    Equipment Utilized During Treatment  Gait belt    Activity Tolerance  Patient tolerated treatment well;Patient limited by fatigue;Patient limited by pain    Behavior During Therapy  [facility] for tasks assessed/performed       Past Medical History:  Diagnosis Date  . Anemia   . Anxiety   . Chest pain   . CHF (congestive heart failure) ([place])   . Constipation   . DDD (degenerative disc disease), cervical   . Depression   . DVT (deep venous thrombosis) ([place])   . Dysphonia   . Dyspnea   . Fatty liver   . Fatty liver   . Headache   . Hyperlipidemia   . Hyperpiesia   . Hypertension   . Hypothyroidism   . Interstitial cystitis   . Left ventricular dysfunction   . Lymphedema   . Nephrolithiasis   . Obstructive sleep apnea   . Osteoarthritis    knees/cervical and lumbar spine  . Pulmonary hypertension ([place])   . Pulmonary nodules    followed by Dr Raul Del  . Pure hypercholesterolemia   . Renal cyst    right    Past Surgical History:  Procedure Laterality Date  . ABDOMINAL HYSTERECTOMY     ovaries left in place  . APPENDECTOMY    . Back Surgeries    . BREAST REDUCTION SURGERY     3/99  . CARDIAC CATHETERIZATION    . cataracts Bilateral   . CERVICAL SPINE SURGERY    . ESOPHAGEAL MANOMETRY N/A 08/02/2015   Procedure: ESOPHAGEAL MANOMETRY (EM);  Surgeon: Josefine Class, MD;  Location: [facility] ENDOSCOPY;  Service: Endoscopy;  Laterality: N/A;  .  ESOPHAGOGASTRODUODENOSCOPY N/A 02/27/2015   Procedure: ESOPHAGOGASTRODUODENOSCOPY (EGD);  Surgeon: Hulen Luster, MD;  Location: [facility] ENDOSCOPY;  Service: Gastroenterology;  Laterality: N/A;  . EXCISIONAL HEMORRHOIDECTOMY    . HEMORRHOID SURGERY    . HIP SURGERY  2013   Right hip surgery  . KNEE ARTHROSCOPY     left and right  . REDUCTION MAMMAPLASTY Bilateral YRS AGO  . REPLACEMENT TOTAL KNEE Bilateral   . rotator cuff surgery     blilateral  . TONSILECTOMY/ADENOIDECTOMY WITH MYRINGOTOMY      There were no vitals filed for this visit.  Subjective Assessment - 01/05/18 1513    Subjective  Patient is having difficulty with her walking and her left hamstring is painful, but it is better today,    Patient is accompained by:  Family member    Pertinent History  Patient has been having left knee pain that began after the knee was replaced 4 years ago. She has been using a rollator for 4 years  and she is getting worse with her ambulation. She is having pain in her left knee 8/10 .Marland Kitchen She is taking morphine and hydrocodone for her back pain. She has  had 5 back operations . She is trying to stop taking the morphine since 1989 . She had physical therapy  4 years ago .     Limitations  Sitting    How long can you stand comfortably?  less than 5 mins    How long can you walk comfortably?  5-10 mins    Patient Stated Goals  She wants to be able to walk better and get exercise. She wants to stop using the rollator if possible and she has had it for 6 years.     Currently in Pain?  Yes    Pain Score  2     Pain Location  Knee    Pain Orientation  Left    Pain Descriptors / Indicators  Aching    Pain Type  Acute pain    Pain Onset  More than a month ago         Treatment: MH to left knee hamstring x 10 mins with stretching to left hamstring in supine with wedge under her back due to inability to lie supine  Seated left knee stretch x 30 sec x 3  Quad sets with 3 sec hold  Standing knee  extension following manual therapy  Manual therapy: Roller stick to hamstring x 10 mins   Edge tool to left hamstring to reduce pain and improve mobility x 10 mins  STM to Left  hamstring to decrease spasm and pain   Patient has less pain following therapy and decreases to 6/10. Patient is able to stand longer with less pain following treatment.                       PT Education - 01/05/18 1513    Education provided  Yes    Education Details  HEP    Person(s) Educated  Patient;Spouse;Child(ren)    Methods  Explanation;Demonstration;Tactile cues;Verbal cues    Comprehension  Verbalized understanding;Returned demonstration;Verbal cues required;Tactile cues required       PT Short Term Goals - 12/23/17 1027      PT SHORT TERM GOAL #1   Title  Patient will be independent in home exercise program to improve strength/mobility for better functional independence with ADLs.    Time  4    Period  Weeks    Status  New    Target Date  01/19/18      PT SHORT TERM GOAL #2   Title  Patient (> 44 years old) will complete five times sit to stand test in < 15 seconds indicating an increased LE strength and improved balance.    Time  4    Period  Weeks    Target Date  01/19/18        PT Long Term Goals - 12/23/17 1029      PT LONG TERM GOAL #1   Title  Patient will increase 10 meter walk test to >1.23ms as to improve gait speed for better community ambulation and to reduce fall risk.    Time  8    Period  Weeks    Status  New    Target Date  02/16/18      PT LONG TERM GOAL #2   Title  Patient will reduce timed up and go to <11 seconds to reduce fall risk and demonstrate improved transfer/gait ability.    Time  8    Period  Weeks    Status  New    Target Date  02/16/18      PT LONG TERM GOAL #3   Title  Patient will report a worst pain of 3/10 on VAS in  left knee to improve tolerance with ADLs and reduced symptoms with activities.     Baseline  8/10 left  knee    Time  8    Period  Weeks    Status  New    Target Date  02/16/18      PT LONG TERM GOAL #4   Title  Patient will be able to to ambulate with rollator for house hold distances and intermediate distances greater than 200 feet with less than 5/10 pain to left knee.     Time  8    Period  Weeks    Status  New    Target Date  02/16/18            Plan - 01/05/18 1518    Clinical Impression Statement  Patient responds to STM and heat to left hamstring to decrease muscle spasm and pain and impove mobiltiy. She performs gentle hamstring stretching and PROM to left knee and is able to decrease her pain level and improve mobility with transfers and ambulation    Rehab Potential  Good    PT Frequency  2x / week    PT Duration  8 weeks    PT Treatment/Interventions  Dry needling;Passive range of motion;Manual techniques;Patient/family education;Ultrasound;Cryotherapy;Electrical Stimulation;Moist Heat;Gait training;Functional mobility training;Therapeutic activities;Therapeutic exercise    PT Next Visit Plan  MH, [place], e-stim    PT Home Exercise Plan  ice     Consulted and Agree with Plan of Care  Patient       Patient will benefit from skilled therapeutic intervention in order to improve the following deficits and impairments:  Abnormal gait, Decreased balance, Decreased endurance, Decreased mobility, Difficulty walking, Pain, Postural dysfunction, Impaired flexibility, Decreased strength, Decreased activity tolerance  Visit Diagnosis: Left knee pain, unspecified chronicity  Muscle weakness (generalized)  Difficulty in walking, not elsewhere classified     Problem List Patient Active Problem List   Diagnosis Date Noted  . Right shoulder pain 05/16/2017  . Chronic venous insufficiency 02/10/2017  . Lymphedema 02/10/2017  . Neuropathy 12/25/2016  . Anemia due to blood loss, chronic 08/07/2016  . GERD (gastroesophageal reflux disease) 12/10/2015  . Finger pain 11/13/2015   . Carotid artery calcification 09/26/2015  . External nasal lesion 09/25/2015  . Muscle cramps 09/01/2015  . Excessive sweating 07/30/2015  . Headache 07/16/2015  . Groin pain 05/14/2015  . Muscle twitching 03/05/2015  . Leg cramps 02/11/2015  . Back pain 02/11/2015  . Abdominal pain 02/11/2015  . Acute cystitis without hematuria 12/12/2014  . Left elbow pain 11/30/2014  . Health care maintenance 11/30/2014  . Osteoporosis 10/19/2014  . Rectal bleeding 10/19/2014  . BMI 33.0-33.9,adult 09/03/2014  . Neck pain 09/03/2014  . Unsteady gait 09/03/2014  . Nocturia 09/03/2014  . Dysphagia 06/01/2014  . Nasal dryness 06/01/2014  . Stress 06/01/2014  . Fatigue 02/27/2014  . Pre-op evaluation 10/12/2013  . Hoarseness 08/14/2013  . Leg swelling 01/24/2013  . CHF (congestive heart failure) ([place]) 12/29/2012  . Cough 12/29/2012  . OSA (obstructive sleep apnea) 07/09/2012  . Osteoarthritis 07/04/2012  . Anemia 07/04/2012  . Chronic constipation 07/04/2012  . Pulmonary hypertension ([place]) 07/04/2012  . Pulmonary nodules 07/04/2012  . Hypertension 07/04/2012  . Hyperlipidemia 07/04/2012  . Hypothyroidism 07/04/2012    Alanson Puls, PT DPT 01/05/2018, 3:19 PM  [facility]  [place] MAIN [facility] SERVICES [address] [place], [place], [address] Phone: [phone]   Fax:  [phone]  Name: Kimberly Roy MRN: [id number] Date of Birth: [birth year]

## 2018-01-07 ENCOUNTER — Ambulatory Visit: Payer: PPO | Admitting: Physical Therapy

## 2018-01-07 DIAGNOSIS — M6281 Muscle weakness (generalized): Secondary | ICD-10-CM

## 2018-01-07 DIAGNOSIS — M25562 Pain in left knee: Secondary | ICD-10-CM | POA: Diagnosis not present

## 2018-01-07 DIAGNOSIS — R262 Difficulty in walking, not elsewhere classified: Secondary | ICD-10-CM

## 2018-01-08 ENCOUNTER — Encounter: Payer: PPO | Admitting: Physical Therapy

## 2018-01-08 ENCOUNTER — Encounter: Payer: Self-pay | Admitting: Physical Therapy

## 2018-01-08 NOTE — Therapy (Signed)
[place] MAIN [facility] SERVICES [address] [place], [place], [address] Phone: [phone]   Fax:  [phone]  Physical Therapy Treatment  Patient Details  Name: Kimberly Roy MRN: [id number] Date of Birth: Aug 30, 1931 Referring Provider: Dereck Leep   Encounter Date: 01/07/2018  PT End of Session - 01/08/18 1333    Visit Number  4    Number of Visits  25    Date for PT Re-Evaluation  03/16/18    PT Start Time  0100    PT Stop Time  0145    PT Time Calculation (min)  45 min    Equipment Utilized During Treatment  Gait belt    Activity Tolerance  Patient tolerated treatment well;Patient limited by fatigue;Patient limited by pain    Behavior During Therapy  [facility] for tasks assessed/performed       Past Medical History:  Diagnosis Date  . Anemia   . Anxiety   . Chest pain   . CHF (congestive heart failure) ([place])   . Constipation   . DDD (degenerative disc disease), cervical   . Depression   . DVT (deep venous thrombosis) ([place])   . Dysphonia   . Dyspnea   . Fatty liver   . Fatty liver   . Headache   . Hyperlipidemia   . Hyperpiesia   . Hypertension   . Hypothyroidism   . Interstitial cystitis   . Left ventricular dysfunction   . Lymphedema   . Nephrolithiasis   . Obstructive sleep apnea   . Osteoarthritis    knees/cervical and lumbar spine  . Pulmonary hypertension ([place])   . Pulmonary nodules    followed by Dr Raul Del  . Pure hypercholesterolemia   . Renal cyst    right    Past Surgical History:  Procedure Laterality Date  . ABDOMINAL HYSTERECTOMY     ovaries left in place  . APPENDECTOMY    . Back Surgeries    . BREAST REDUCTION SURGERY     3/99  . CARDIAC CATHETERIZATION    . cataracts Bilateral   . CERVICAL SPINE SURGERY    . ESOPHAGEAL MANOMETRY N/A 08/02/2015   Procedure: ESOPHAGEAL MANOMETRY (EM);  Surgeon: Josefine Class, MD;  Location: [facility] ENDOSCOPY;  Service: Endoscopy;  Laterality: N/A;  .  ESOPHAGOGASTRODUODENOSCOPY N/A 02/27/2015   Procedure: ESOPHAGOGASTRODUODENOSCOPY (EGD);  Surgeon: Hulen Luster, MD;  Location: [facility] ENDOSCOPY;  Service: Gastroenterology;  Laterality: N/A;  . EXCISIONAL HEMORRHOIDECTOMY    . HEMORRHOID SURGERY    . HIP SURGERY  2013   Right hip surgery  . KNEE ARTHROSCOPY     left and right  . REDUCTION MAMMAPLASTY Bilateral YRS AGO  . REPLACEMENT TOTAL KNEE Bilateral   . rotator cuff surgery     blilateral  . TONSILECTOMY/ADENOIDECTOMY WITH MYRINGOTOMY      There were no vitals filed for this visit.  Subjective Assessment - 01/08/18 1332    Subjective  Patient is having difficulty with her walking and her left hamstring is painful, but it is better today,    Patient is accompained by:  Family member    Pertinent History  Patient has been having left knee pain that began after the knee was replaced 4 years ago. She has been using a rollator for 4 years  and she is getting worse with her ambulation. She is having pain in her left knee 8/10 .Marland Kitchen She is taking morphine and hydrocodone for her back pain. She has  had 5 back operations . She is trying to stop taking the morphine since 1989 . She had physical therapy  4 years ago .     Limitations  Sitting    How long can you stand comfortably?  less than 5 mins    How long can you walk comfortably?  5-10 mins    Patient Stated Goals  She wants to be able to walk better and get exercise. She wants to stop using the rollator if possible and she has had it for 6 years.     Currently in Pain?  Yes    Pain Score  3     Pain Location  Knee    Pain Orientation  Left    Pain Descriptors / Indicators  Tender;Throbbing    Pain Type  Chronic pain    Pain Onset  More than a month ago    Pain Frequency  Constant    Aggravating Factors   walking, standning     Effect of Pain on Daily Activities  difficult walking    Multiple Pain Sites  No       Treatment: MH to left knee hamstring x 10 mins with stretching to left  hamstring in supine with wedge under her back due to inability to lie supine  Seated left knee stretch x 30 sec x 3  Quad sets with 3 sec hold  Standing knee extension following manual therapy  Manual therapy: Roller stick to hamstring x 10 mins   Edge tool to left hamstring to reduce pain and improve mobility x 10 mins  : [place] to lateral left hip at 1. 5 cm squared x 10 mins constant wave form  STM to Left hamstring to decrease spasm and pain  Patient has less pain following therapy and decreases to 6/10. Patient is able to stand longer with less pain following treatment.                         PT Education - 01/08/18 1333    Education provided  Yes    Education Details  HEP    Person(s) Educated  Patient    Methods  Explanation    Comprehension  Verbalized understanding       PT Short Term Goals - 12/23/17 1027      PT SHORT TERM GOAL #1   Title  Patient will be independent in home exercise program to improve strength/mobility for better functional independence with ADLs.    Time  4    Period  Weeks    Status  New    Target Date  01/19/18      PT SHORT TERM GOAL #2   Title  Patient (> 10 years old) will complete five times sit to stand test in < 15 seconds indicating an increased LE strength and improved balance.    Time  4    Period  Weeks    Target Date  01/19/18        PT Long Term Goals - 12/23/17 1029      PT LONG TERM GOAL #1   Title  Patient will increase 10 meter walk test to >1.44ms as to improve gait speed for better community ambulation and to reduce fall risk.    Time  8    Period  Weeks    Status  New    Target Date  02/16/18      PT LONG TERM GOAL #2  Title  Patient will reduce timed up and go to <11 seconds to reduce fall risk and demonstrate improved transfer/gait ability.    Time  8    Period  Weeks    Status  New    Target Date  02/16/18      PT LONG TERM GOAL #3   Title  Patient will report a worst pain  of 3/10 on VAS in  left knee to improve tolerance with ADLs and reduced symptoms with activities.     Baseline  8/10 left knee    Time  8    Period  Weeks    Status  New    Target Date  02/16/18      PT LONG TERM GOAL #4   Title  Patient will be able to to ambulate with rollator for house hold distances and intermediate distances greater than 200 feet with less than 5/10 pain to left knee.     Time  8    Period  Weeks    Status  New    Target Date  02/16/18            Plan - 01/07/18 1333    Clinical Impression Statement  Patient presents with left knee tenderness posterially and decreased standing tolerance and decreased walking tolerance. Patient tolerates [place] and stretching and  manual therapy. to left knee. She responded to manual therapy with better knee ROM and standing tolerance.     Rehab Potential  Good    PT Frequency  2x / week    PT Duration  8 weeks    PT Treatment/Interventions  Dry needling;Passive range of motion;Manual techniques;Patient/family education;Ultrasound;Cryotherapy;Electrical Stimulation;Moist Heat;Gait training;Functional mobility training;Therapeutic activities;Therapeutic exercise    PT Next Visit Plan  MH, [place], e-stim    PT Home Exercise Plan  ice     Consulted and Agree with Plan of Care  Patient       Patient will benefit from skilled therapeutic intervention in order to improve the following deficits and impairments:  Abnormal gait, Decreased balance, Decreased endurance, Decreased mobility, Difficulty walking, Pain, Postural dysfunction, Impaired flexibility, Decreased strength, Decreased activity tolerance  Visit Diagnosis: Left knee pain, unspecified chronicity  Muscle weakness (generalized)  Difficulty in walking, not elsewhere classified     Problem List Patient Active Problem List   Diagnosis Date Noted  . Right shoulder pain 05/16/2017  . Chronic venous insufficiency 02/10/2017  . Lymphedema 02/10/2017  . Neuropathy  12/25/2016  . Anemia due to blood loss, chronic 08/07/2016  . GERD (gastroesophageal reflux disease) 12/10/2015  . Finger pain 11/13/2015  . Carotid artery calcification 09/26/2015  . External nasal lesion 09/25/2015  . Muscle cramps 09/01/2015  . Excessive sweating 07/30/2015  . Headache 07/16/2015  . Groin pain 05/14/2015  . Muscle twitching 03/05/2015  . Leg cramps 02/11/2015  . Back pain 02/11/2015  . Abdominal pain 02/11/2015  . Acute cystitis without hematuria 12/12/2014  . Left elbow pain 11/30/2014  . Health care maintenance 11/30/2014  . Osteoporosis 10/19/2014  . Rectal bleeding 10/19/2014  . BMI 33.0-33.9,adult 09/03/2014  . Neck pain 09/03/2014  . Unsteady gait 09/03/2014  . Nocturia 09/03/2014  . Dysphagia 06/01/2014  . Nasal dryness 06/01/2014  . Stress 06/01/2014  . Fatigue 02/27/2014  . Pre-op evaluation 10/12/2013  . Hoarseness 08/14/2013  . Leg swelling 01/24/2013  . CHF (congestive heart failure) ([place]) 12/29/2012  . Cough 12/29/2012  . OSA (obstructive sleep apnea) 07/09/2012  . Osteoarthritis 07/04/2012  . Anemia  07/04/2012  . Chronic constipation 07/04/2012  . Pulmonary hypertension ([place]) 07/04/2012  . Pulmonary nodules 07/04/2012  . Hypertension 07/04/2012  . Hyperlipidemia 07/04/2012  . Hypothyroidism 07/04/2012    Alanson Puls, PT DPT 01/08/2018, 1:42 PM  [place] MAIN [facility] SERVICES [address] [place], [place], [address] Phone: [phone]   Fax:  [phone]  Name: Kimberly Roy MRN: [id number] Date of Birth: 29-Aug-1931

## 2018-01-09 DIAGNOSIS — G4733 Obstructive sleep apnea (adult) (pediatric): Secondary | ICD-10-CM | POA: Diagnosis not present

## 2018-01-09 DIAGNOSIS — R0609 Other forms of dyspnea: Secondary | ICD-10-CM | POA: Diagnosis not present

## 2018-01-09 DIAGNOSIS — I2723 Pulmonary hypertension due to lung diseases and hypoxia: Secondary | ICD-10-CM | POA: Diagnosis not present

## 2018-01-09 DIAGNOSIS — J849 Interstitial pulmonary disease, unspecified: Secondary | ICD-10-CM | POA: Diagnosis not present

## 2018-01-09 NOTE — Telephone Encounter (Signed)
Last filled 03/06/17 Last office visit 10/02/17 No office visit scheduled

## 2018-01-12 ENCOUNTER — Ambulatory Visit: Payer: PPO | Admitting: Physical Therapy

## 2018-01-12 ENCOUNTER — Encounter: Payer: Self-pay | Admitting: Physical Therapy

## 2018-01-12 DIAGNOSIS — M25562 Pain in left knee: Secondary | ICD-10-CM | POA: Diagnosis not present

## 2018-01-12 DIAGNOSIS — R262 Difficulty in walking, not elsewhere classified: Secondary | ICD-10-CM

## 2018-01-12 DIAGNOSIS — M6281 Muscle weakness (generalized): Secondary | ICD-10-CM

## 2018-01-12 NOTE — Therapy (Signed)
[place] MAIN [facility] SERVICES [address] [place], [place], [address] Phone: [phone]   Fax:  [phone]  Physical Therapy Treatment  Patient Details  Name: Kimberly Roy MRN: [id number] Date of Birth: 05/04/1931 Referring Provider: Dereck Leep   Encounter Date: 01/12/2018  PT End of Session - 01/12/18 1642    Visit Number  5    Number of Visits  25    Date for PT Re-Evaluation  03/16/18    PT Start Time  0345    PT Stop Time  0430    PT Time Calculation (min)  45 min    Equipment Utilized During Treatment  Gait belt    Activity Tolerance  Patient tolerated treatment well;Patient limited by fatigue;Patient limited by pain    Behavior During Therapy  [facility] for tasks assessed/performed       Past Medical History:  Diagnosis Date  . Anemia   . Anxiety   . Chest pain   . CHF (congestive heart failure) ([place])   . Constipation   . DDD (degenerative disc disease), cervical   . Depression   . DVT (deep venous thrombosis) ([place])   . Dysphonia   . Dyspnea   . Fatty liver   . Fatty liver   . Headache   . Hyperlipidemia   . Hyperpiesia   . Hypertension   . Hypothyroidism   . Interstitial cystitis   . Left ventricular dysfunction   . Lymphedema   . Nephrolithiasis   . Obstructive sleep apnea   . Osteoarthritis    knees/cervical and lumbar spine  . Pulmonary hypertension ([place])   . Pulmonary nodules    followed by Dr Raul Del  . Pure hypercholesterolemia   . Renal cyst    right    Past Surgical History:  Procedure Laterality Date  . ABDOMINAL HYSTERECTOMY     ovaries left in place  . APPENDECTOMY    . Back Surgeries    . BREAST REDUCTION SURGERY     3/99  . CARDIAC CATHETERIZATION    . cataracts Bilateral   . CERVICAL SPINE SURGERY    . ESOPHAGEAL MANOMETRY N/A 08/02/2015   Procedure: ESOPHAGEAL MANOMETRY (EM);  Surgeon: Josefine Class, MD;  Location: [facility] ENDOSCOPY;  Service: Endoscopy;  Laterality: N/A;  .  ESOPHAGOGASTRODUODENOSCOPY N/A 02/27/2015   Procedure: ESOPHAGOGASTRODUODENOSCOPY (EGD);  Surgeon: Hulen Luster, MD;  Location: [facility] ENDOSCOPY;  Service: Gastroenterology;  Laterality: N/A;  . EXCISIONAL HEMORRHOIDECTOMY    . HEMORRHOID SURGERY    . HIP SURGERY  2013   Right hip surgery  . KNEE ARTHROSCOPY     left and right  . REDUCTION MAMMAPLASTY Bilateral YRS AGO  . REPLACEMENT TOTAL KNEE Bilateral   . rotator cuff surgery     blilateral  . TONSILECTOMY/ADENOIDECTOMY WITH MYRINGOTOMY      There were no vitals filed for this visit.  Subjective Assessment - 01/12/18 1641    Subjective  Patient is having difficulty with her walking and her left hamstring is painful, but it is better today,    Patient is accompained by:  Family member    Pertinent History  Patient has been having left knee pain that began after the knee was replaced 4 years ago. She has been using a rollator for 4 years  and she is getting worse with her ambulation. She is having pain in her left knee 8/10 .Marland Kitchen She is taking morphine and hydrocodone for her back pain. She has  had 5 back operations . She is trying to stop taking the morphine since 1989 . She had physical therapy  4 years ago .     Limitations  Sitting    How long can you stand comfortably?  less than 5 mins    How long can you walk comfortably?  5-10 mins    Patient Stated Goals  She wants to be able to walk better and get exercise. She wants to stop using the rollator if possible and she has had it for 6 years.     Currently in Pain?  Yes    Pain Score  2     Pain Location  Knee    Pain Orientation  Left    Pain Descriptors / Indicators  Aching    Pain Type  Chronic pain    Pain Onset  More than a month ago    Multiple Pain Sites  No         Treatment: MH to left knee hamstring x 10 mins with stretching to left hamstring in supine with wedge under her back due to inability to lie supine  Seated left knee stretch x 30 sec x 3  Quad sets with 3  sec hold  Standing knee extension following manual therapy  Manual therapy: Roller stick to hamstring x 10 mins  Edge tool to left hamstring to reduce pain and improve mobility x 10 mins  : [place] to lateral left hip at 1. 5 cm squared x 10 mins constant wave form  STM to Left hamstring to decrease spasm and pain  Patient has less pain following therapy and decreases to 6/10. Patient is able to stand longer with less pain following treatment.                     PT Education - 01/12/18 1642    Education provided  Yes    Education Details  HEP    Person(s) Educated  Patient    Methods  Explanation;Demonstration    Comprehension  Verbalized understanding       PT Short Term Goals - 12/23/17 1027      PT SHORT TERM GOAL #1   Title  Patient will be independent in home exercise program to improve strength/mobility for better functional independence with ADLs.    Time  4    Period  Weeks    Status  New    Target Date  01/19/18      PT SHORT TERM GOAL #2   Title  Patient (> 82 years old) will complete five times sit to stand test in < 15 seconds indicating an increased LE strength and improved balance.    Time  4    Period  Weeks    Target Date  01/19/18        PT Long Term Goals - 12/23/17 1029      PT LONG TERM GOAL #1   Title  Patient will increase 10 meter walk test to >1.14ms as to improve gait speed for better community ambulation and to reduce fall risk.    Time  8    Period  Weeks    Status  New    Target Date  02/16/18      PT LONG TERM GOAL #2   Title  Patient will reduce timed up and go to <11 seconds to reduce fall risk and demonstrate improved transfer/gait ability.    Time  8    Period  Weeks    Status  New    Target Date  02/16/18      PT LONG TERM GOAL #3   Title  Patient will report a worst pain of 3/10 on VAS in  left knee to improve tolerance with ADLs and reduced symptoms with activities.     Baseline  8/10 left knee     Time  8    Period  Weeks    Status  New    Target Date  02/16/18      PT LONG TERM GOAL #4   Title  Patient will be able to to ambulate with rollator for house hold distances and intermediate distances greater than 200 feet with less than 5/10 pain to left knee.     Time  8    Period  Weeks    Status  New    Target Date  02/16/18            Plan - 01/12/18 1643    Clinical Impression Statement Patient presents with left knee tenderness posterially and decreased standing tolerance and decreased walking tolerance. Patient tolerates [place] and stretching and manual therapy. to left knee. She responded to manual therapy with better knee ROM and standing tolerance   Rehab Potential  Good    PT Frequency  2x / week    PT Duration  8 weeks    PT Treatment/Interventions  Dry needling;Passive range of motion;Manual techniques;Patient/family education;Ultrasound;Cryotherapy;Electrical Stimulation;Moist Heat;Gait training;Functional mobility training;Therapeutic activities;Therapeutic exercise    PT Next Visit Plan  MH, [place], e-stim    PT Home Exercise Plan  ice     Consulted and Agree with Plan of Care  Patient       Patient will benefit from skilled therapeutic intervention in order to improve the following deficits and impairments:  Abnormal gait, Decreased balance, Decreased endurance, Decreased mobility, Difficulty walking, Pain, Postural dysfunction, Impaired flexibility, Decreased strength, Decreased activity tolerance  Visit Diagnosis: Left knee pain, unspecified chronicity  Muscle weakness (generalized)  Difficulty in walking, not elsewhere classified     Problem List Patient Active Problem List   Diagnosis Date Noted  . Right shoulder pain 05/16/2017  . Chronic venous insufficiency 02/10/2017  . Lymphedema 02/10/2017  . Neuropathy 12/25/2016  . Anemia due to blood loss, chronic 08/07/2016  . GERD (gastroesophageal reflux disease) 12/10/2015  . Finger pain 11/13/2015  .  Carotid artery calcification 09/26/2015  . External nasal lesion 09/25/2015  . Muscle cramps 09/01/2015  . Excessive sweating 07/30/2015  . Headache 07/16/2015  . Groin pain 05/14/2015  . Muscle twitching 03/05/2015  . Leg cramps 02/11/2015  . Back pain 02/11/2015  . Abdominal pain 02/11/2015  . Acute cystitis without hematuria 12/12/2014  . Left elbow pain 11/30/2014  . Health care maintenance 11/30/2014  . Osteoporosis 10/19/2014  . Rectal bleeding 10/19/2014  . BMI 33.0-33.9,adult 09/03/2014  . Neck pain 09/03/2014  . Unsteady gait 09/03/2014  . Nocturia 09/03/2014  . Dysphagia 06/01/2014  . Nasal dryness 06/01/2014  . Stress 06/01/2014  . Fatigue 02/27/2014  . Pre-op evaluation 10/12/2013  . Hoarseness 08/14/2013  . Leg swelling 01/24/2013  . CHF (congestive heart failure) ([place]) 12/29/2012  . Cough 12/29/2012  . OSA (obstructive sleep apnea) 07/09/2012  . Osteoarthritis 07/04/2012  . Anemia 07/04/2012  . Chronic constipation 07/04/2012  . Pulmonary hypertension ([place]) 07/04/2012  . Pulmonary nodules 07/04/2012  . Hypertension 07/04/2012  . Hyperlipidemia 07/04/2012  . Hypothyroidism 07/04/2012    Alanson Puls, PT  DPT 01/12/2018, 5:31 PM  [place] MAIN [facility] SERVICES [address] [place], [place], [address] Phone: [phone]   Fax:  [phone]  Name: Kimberly Roy MRN: [id number] Date of Birth: 29-May-1931

## 2018-01-13 DIAGNOSIS — G608 Other hereditary and idiopathic neuropathies: Secondary | ICD-10-CM | POA: Diagnosis not present

## 2018-01-13 DIAGNOSIS — M47012 Anterior spinal artery compression syndromes, cervical region: Secondary | ICD-10-CM | POA: Diagnosis not present

## 2018-01-13 DIAGNOSIS — M545 Low back pain: Secondary | ICD-10-CM | POA: Diagnosis not present

## 2018-01-13 DIAGNOSIS — G253 Myoclonus: Secondary | ICD-10-CM | POA: Diagnosis not present

## 2018-01-14 ENCOUNTER — Ambulatory Visit: Payer: PPO | Admitting: Physical Therapy

## 2018-01-20 ENCOUNTER — Ambulatory Visit: Payer: PPO | Admitting: Physical Therapy

## 2018-01-20 ENCOUNTER — Encounter: Payer: Self-pay | Admitting: Physical Therapy

## 2018-01-20 DIAGNOSIS — M25562 Pain in left knee: Secondary | ICD-10-CM | POA: Diagnosis not present

## 2018-01-20 DIAGNOSIS — M6281 Muscle weakness (generalized): Secondary | ICD-10-CM

## 2018-01-20 DIAGNOSIS — R262 Difficulty in walking, not elsewhere classified: Secondary | ICD-10-CM

## 2018-01-20 NOTE — Therapy (Signed)
[place] MAIN [facility] SERVICES [address] [place], [place], [address] Phone: [phone]   Fax:  [phone]  Physical Therapy Treatment  Patient Details  Name: Kimberly Roy MRN: [id number] Date of Birth: [birth year] Referring Provider: Dereck Leep   Encounter Date: 01/20/2018  PT End of Session - 01/20/18 1548    Visit Number  6    Number of Visits  25    Date for PT Re-Evaluation  03/16/18    PT Start Time  1941    PT Stop Time  1630    PT Time Calculation (min)  43 min    Equipment Utilized During Treatment  Gait belt    Activity Tolerance  Patient tolerated treatment well;Patient limited by fatigue;Patient limited by pain    Behavior During Therapy  [facility] for tasks assessed/performed       Past Medical History:  Diagnosis Date  . Anemia   . Anxiety   . Chest pain   . CHF (congestive heart failure) ([place])   . Constipation   . DDD (degenerative disc disease), cervical   . Depression   . DVT (deep venous thrombosis) ([place])   . Dysphonia   . Dyspnea   . Fatty liver   . Fatty liver   . Headache   . Hyperlipidemia   . Hyperpiesia   . Hypertension   . Hypothyroidism   . Interstitial cystitis   . Left ventricular dysfunction   . Lymphedema   . Nephrolithiasis   . Obstructive sleep apnea   . Osteoarthritis    knees/cervical and lumbar spine  . Pulmonary hypertension ([place])   . Pulmonary nodules    followed by Dr Raul Del  . Pure hypercholesterolemia   . Renal cyst    right    Past Surgical History:  Procedure Laterality Date  . ABDOMINAL HYSTERECTOMY     ovaries left in place  . APPENDECTOMY    . Back Surgeries    . BREAST REDUCTION SURGERY     3/99  . CARDIAC CATHETERIZATION    . cataracts Bilateral   . CERVICAL SPINE SURGERY    . ESOPHAGEAL MANOMETRY N/A 08/02/2015   Procedure: ESOPHAGEAL MANOMETRY (EM);  Surgeon: Josefine Class, MD;  Location: [facility] ENDOSCOPY;  Service: Endoscopy;  Laterality: N/A;  .  ESOPHAGOGASTRODUODENOSCOPY N/A 02/27/2015   Procedure: ESOPHAGOGASTRODUODENOSCOPY (EGD);  Surgeon: Hulen Luster, MD;  Location: [facility] ENDOSCOPY;  Service: Gastroenterology;  Laterality: N/A;  . EXCISIONAL HEMORRHOIDECTOMY    . HEMORRHOID SURGERY    . HIP SURGERY  2013   Right hip surgery  . KNEE ARTHROSCOPY     left and right  . REDUCTION MAMMAPLASTY Bilateral YRS AGO  . REPLACEMENT TOTAL KNEE Bilateral   . rotator cuff surgery     blilateral  . TONSILECTOMY/ADENOIDECTOMY WITH MYRINGOTOMY      There were no vitals filed for this visit.  Subjective Assessment - 01/20/18 1552    Subjective  Pt reports she is having back pain and leg pain.  Pt reports that her husband had a stroke this past Friday.  She is hopeful that he will be approved for CIR.      Patient is accompained by:  Family member    Pertinent History  Patient has been having left knee pain that began after the knee was replaced 4 years ago. She has been using a rollator for 4 years  and she is getting worse with her ambulation. She is having pain in  her left knee 8/10 .Marland Kitchen She is taking morphine and hydrocodone for her back pain. She has had 5 back operations . She is trying to stop taking the morphine since 1989 . She had physical therapy  4 years ago .     Limitations  Sitting    How long can you stand comfortably?  less than 5 mins    How long can you walk comfortably?  5-10 mins    Patient Stated Goals  She wants to be able to walk better and get exercise. She wants to stop using the rollator if possible and she has had it for 6 years.     Currently in Pain?  Yes    Pain Score  6     Pain Location  Back    Pain Orientation  Lower    Pain Descriptors / Indicators  Aching    Pain Type  Chronic pain    Pain Onset  More than a month ago    Multiple Pain Sites  Yes    Pain Score  4    Pain Location  Knee    Pain Orientation  Left    Pain Descriptors / Indicators  Aching       TREATMENT   Vitals taken at start of session  with pt in sitting: BP 135/48, SpO2 100%, pulse 63   Strain counterstrain with light pressure to L hamstring x20 each direction into L knee F and E   Quad sets with 10 sec holds x10 (added to HEP)   Repeated with light manual overpressure for stretch   STM distal medial hamstring muscle belly   L Grade III AP joint mobilization to proximal tibia on femur   L Grade III patella mobilizations 2x30 seconds each direction superior, inferior, medial   Seated left hamstring stretch with L foot on floor 3x30 seconds (added to HEP)                          PT Education - 01/20/18 1548    Education provided  Yes    Education Details  Exercise technique    Person(s) Educated  Patient    Methods  Explanation;Demonstration;Verbal cues    Comprehension  Verbalized understanding;Returned demonstration;Verbal cues required;Need further instruction       PT Short Term Goals - 12/23/17 1027      PT SHORT TERM GOAL #1   Title  Patient will be independent in home exercise program to improve strength/mobility for better functional independence with ADLs.    Time  4    Period  Weeks    Status  New    Target Date  01/19/18      PT SHORT TERM GOAL #2   Title  Patient (> 35 years old) will complete five times sit to stand test in < 15 seconds indicating an increased LE strength and improved balance.    Time  4    Period  Weeks    Target Date  01/19/18        PT Long Term Goals - 12/23/17 1029      PT LONG TERM GOAL #1   Title  Patient will increase 10 meter walk test to >1.36ms as to improve gait speed for better community ambulation and to reduce fall risk.    Time  8    Period  Weeks    Status  New    Target Date  02/16/18  PT LONG TERM GOAL #2   Title  Patient will reduce timed up and go to <11 seconds to reduce fall risk and demonstrate improved transfer/gait ability.    Time  8    Period  Weeks    Status  New    Target Date  02/16/18      PT LONG TERM  GOAL #3   Title  Patient will report a worst pain of 3/10 on VAS in  left knee to improve tolerance with ADLs and reduced symptoms with activities.     Baseline  8/10 left knee    Time  8    Period  Weeks    Status  New    Target Date  02/16/18      PT LONG TERM GOAL #4   Title  Patient will be able to to ambulate with rollator for house hold distances and intermediate distances greater than 200 feet with less than 5/10 pain to left knee.     Time  8    Period  Weeks    Status  New    Target Date  02/16/18            Plan - 01/20/18 1554    Clinical Impression Statement  Pt demonstrates TTP along distal medial hamstring muscle belly and responded well to STM.  Introduced light manual pressure with knee presses this session to further promote L knee extension.  Additionally introduced joint mobilizations to L patella and tib fib joint in an effort to improve ROM.  Pt sent home with HEP handout on L hamstring stretch and quad sets. Pt will benefit from continued skilled PT interventions for decreased pain and improved mobility.     Rehab Potential  Good    PT Frequency  2x / week    PT Duration  8 weeks    PT Treatment/Interventions  Dry needling;Passive range of motion;Manual techniques;Patient/family education;Ultrasound;Cryotherapy;Electrical Stimulation;Moist Heat;Gait training;Functional mobility training;Therapeutic activities;Therapeutic exercise    PT Next Visit Plan  MH, [place], e-stim    PT Home Exercise Plan  ice, quad sets, hamstring stretch in sitting    Consulted and Agree with Plan of Care  Patient       Patient will benefit from skilled therapeutic intervention in order to improve the following deficits and impairments:  Abnormal gait, Decreased balance, Decreased endurance, Decreased mobility, Difficulty walking, Pain, Postural dysfunction, Impaired flexibility, Decreased strength, Decreased activity tolerance  Visit Diagnosis: Left knee pain, unspecified  chronicity  Muscle weakness (generalized)  Difficulty in walking, not elsewhere classified     Problem List Patient Active Problem List   Diagnosis Date Noted  . Right shoulder pain 05/16/2017  . Chronic venous insufficiency 02/10/2017  . Lymphedema 02/10/2017  . Neuropathy 12/25/2016  . Anemia due to blood loss, chronic 08/07/2016  . GERD (gastroesophageal reflux disease) 12/10/2015  . Finger pain 11/13/2015  . Carotid artery calcification 09/26/2015  . External nasal lesion 09/25/2015  . Muscle cramps 09/01/2015  . Excessive sweating 07/30/2015  . Headache 07/16/2015  . Groin pain 05/14/2015  . Muscle twitching 03/05/2015  . Leg cramps 02/11/2015  . Back pain 02/11/2015  . Abdominal pain 02/11/2015  . Acute cystitis without hematuria 12/12/2014  . Left elbow pain 11/30/2014  . Health care maintenance 11/30/2014  . Osteoporosis 10/19/2014  . Rectal bleeding 10/19/2014  . BMI 33.0-33.9,adult 09/03/2014  . Neck pain 09/03/2014  . Unsteady gait 09/03/2014  . Nocturia 09/03/2014  . Dysphagia 06/01/2014  . Nasal dryness  06/01/2014  . Stress 06/01/2014  . Fatigue 02/27/2014  . Pre-op evaluation 10/12/2013  . Hoarseness 08/14/2013  . Leg swelling 01/24/2013  . CHF (congestive heart failure) ([place]) 12/29/2012  . Cough 12/29/2012  . OSA (obstructive sleep apnea) 07/09/2012  . Osteoarthritis 07/04/2012  . Anemia 07/04/2012  . Chronic constipation 07/04/2012  . Pulmonary hypertension ([place]) 07/04/2012  . Pulmonary nodules 07/04/2012  . Hypertension 07/04/2012  . Hyperlipidemia 07/04/2012  . Hypothyroidism 07/04/2012    Collie Siad PT, DPT 01/20/2018, 4:38 PM  [place] MAIN [facility] SERVICES [address] [place], [place], [address] Phone: [phone]   Fax:  [phone]  Name: Kimberly Roy MRN: [id number] Date of Birth: 1931-02-07

## 2018-01-22 ENCOUNTER — Encounter: Payer: Self-pay | Admitting: Physical Therapy

## 2018-01-22 ENCOUNTER — Ambulatory Visit: Payer: PPO | Admitting: Physical Therapy

## 2018-01-22 DIAGNOSIS — R262 Difficulty in walking, not elsewhere classified: Secondary | ICD-10-CM

## 2018-01-22 DIAGNOSIS — F330 Major depressive disorder, recurrent, mild: Secondary | ICD-10-CM | POA: Diagnosis not present

## 2018-01-22 DIAGNOSIS — N390 Urinary tract infection, site not specified: Secondary | ICD-10-CM | POA: Diagnosis not present

## 2018-01-22 DIAGNOSIS — F5105 Insomnia due to other mental disorder: Secondary | ICD-10-CM | POA: Diagnosis not present

## 2018-01-22 DIAGNOSIS — F419 Anxiety disorder, unspecified: Secondary | ICD-10-CM | POA: Diagnosis not present

## 2018-01-22 DIAGNOSIS — R32 Unspecified urinary incontinence: Secondary | ICD-10-CM | POA: Diagnosis not present

## 2018-01-22 DIAGNOSIS — M25562 Pain in left knee: Secondary | ICD-10-CM | POA: Diagnosis not present

## 2018-01-22 DIAGNOSIS — M6281 Muscle weakness (generalized): Secondary | ICD-10-CM

## 2018-01-22 NOTE — Therapy (Signed)
[place] MAIN [facility] SERVICES [address] [place], [place], [address] Phone: [phone]   Fax:  [phone]  Physical Therapy Treatment  Patient Details  Name: Kimberly Roy MRN: [id number] Date of Birth: [birth year] Referring Provider: Dereck Leep   Encounter Date: 01/22/2018  PT End of Session - 01/22/18 1207    Visit Number  7    Number of Visits  25    Date for PT Re-Evaluation  03/16/18    PT Start Time  1130    PT Stop Time  1215    PT Time Calculation (min)  45 min    Equipment Utilized During Treatment  Gait belt    Activity Tolerance  Patient tolerated treatment well;Patient limited by fatigue;Patient limited by pain    Behavior During Therapy  [facility] for tasks assessed/performed       Past Medical History:  Diagnosis Date  . Anemia   . Anxiety   . Chest pain   . CHF (congestive heart failure) ([place])   . Constipation   . DDD (degenerative disc disease), cervical   . Depression   . DVT (deep venous thrombosis) ([place])   . Dysphonia   . Dyspnea   . Fatty liver   . Fatty liver   . Headache   . Hyperlipidemia   . Hyperpiesia   . Hypertension   . Hypothyroidism   . Interstitial cystitis   . Left ventricular dysfunction   . Lymphedema   . Nephrolithiasis   . Obstructive sleep apnea   . Osteoarthritis    knees/cervical and lumbar spine  . Pulmonary hypertension ([place])   . Pulmonary nodules    followed by Dr Raul Del  . Pure hypercholesterolemia   . Renal cyst    right    Past Surgical History:  Procedure Laterality Date  . ABDOMINAL HYSTERECTOMY     ovaries left in place  . APPENDECTOMY    . Back Surgeries    . BREAST REDUCTION SURGERY     3/99  . CARDIAC CATHETERIZATION    . cataracts Bilateral   . CERVICAL SPINE SURGERY    . ESOPHAGEAL MANOMETRY N/A 08/02/2015   Procedure: ESOPHAGEAL MANOMETRY (EM);  Surgeon: Josefine Class, MD;  Location: [facility] ENDOSCOPY;  Service: Endoscopy;  Laterality: N/A;  .  ESOPHAGOGASTRODUODENOSCOPY N/A 02/27/2015   Procedure: ESOPHAGOGASTRODUODENOSCOPY (EGD);  Surgeon: Hulen Luster, MD;  Location: [facility] ENDOSCOPY;  Service: Gastroenterology;  Laterality: N/A;  . EXCISIONAL HEMORRHOIDECTOMY    . HEMORRHOID SURGERY    . HIP SURGERY  2013   Right hip surgery  . KNEE ARTHROSCOPY     left and right  . REDUCTION MAMMAPLASTY Bilateral YRS AGO  . REPLACEMENT TOTAL KNEE Bilateral   . rotator cuff surgery     blilateral  . TONSILECTOMY/ADENOIDECTOMY WITH MYRINGOTOMY      There were no vitals filed for this visit.  Subjective Assessment - 01/22/18 1206    Subjective  Pt reports she is having back pain and leg pain.  She is having 4/10 pain to left knee,     Patient is accompained by:  Family member    Pertinent History  Patient has been having left knee pain that began after the knee was replaced 4 years ago. She has been using a rollator for 4 years  and she is getting worse with her ambulation. She is having pain in her left knee 8/10 .Marland Kitchen She is taking morphine and hydrocodone for her back pain.  She has had 5 back operations . She is trying to stop taking the morphine since 1989 . She had physical therapy  4 years ago .     Limitations  Sitting    How long can you stand comfortably?  less than 5 mins    How long can you walk comfortably?  5-10 mins    Patient Stated Goals  She wants to be able to walk better and get exercise. She wants to stop using the rollator if possible and she has had it for 6 years.     Currently in Pain?  Yes    Pain Score  4     Pain Location  Knee    Pain Orientation  Left    Pain Descriptors / Indicators  Aching    Pain Onset  More than a month ago       Treatment:    Standing knee extension with BTB with 5 sec hold x 10 x 2 sets    MH to left knee hamstring x 10 mins with stretching to left hamstring in supine with wedge under her back due to inability to lie supine  Seated left knee stretch x 30 sec x 3  Quad sets with 3 sec  hold  Standing knee extension with BTB with 5 sec hold x 10 x 2 sets    Manual therapy: Roller stick to hamstring x 10 mins  Edge tool to left hamstring to reduce pain and improve mobility x 10 mins  : [place] to lateral left hip at 1. 5 cm squared x 10 mins constant wave form  STM to Left hamstring to decrease spasm and pain  Patient has less pain following therapy and decreases to 6/10. Patient is able to stand longer with less pain following treatment.                      PT Education - 01/22/18 1207    Education provided  Yes    Education Details  HEP    Person(s) Educated  Patient    Methods  Explanation;Demonstration    Comprehension  Verbalized understanding;Returned demonstration       PT Short Term Goals - 12/23/17 1027      PT SHORT TERM GOAL #1   Title  Patient will be independent in home exercise program to improve strength/mobility for better functional independence with ADLs.    Time  4    Period  Weeks    Status  New    Target Date  01/19/18      PT SHORT TERM GOAL #2   Title  Patient (> 83 years old) will complete five times sit to stand test in < 15 seconds indicating an increased LE strength and improved balance.    Time  4    Period  Weeks    Target Date  01/19/18        PT Long Term Goals - 12/23/17 1029      PT LONG TERM GOAL #1   Title  Patient will increase 10 meter walk test to >1.30ms as to improve gait speed for better community ambulation and to reduce fall risk.    Time  8    Period  Weeks    Status  New    Target Date  02/16/18      PT LONG TERM GOAL #2   Title  Patient will reduce timed up and go to <11 seconds to reduce fall  risk and demonstrate improved transfer/gait ability.    Time  8    Period  Weeks    Status  New    Target Date  02/16/18      PT LONG TERM GOAL #3   Title  Patient will report a worst pain of 3/10 on VAS in  left knee to improve tolerance with ADLs and reduced symptoms with  activities.     Baseline  8/10 left knee    Time  8    Period  Weeks    Status  New    Target Date  02/16/18      PT LONG TERM GOAL #4   Title  Patient will be able to to ambulate with rollator for house hold distances and intermediate distances greater than 200 feet with less than 5/10 pain to left knee.     Time  8    Period  Weeks    Status  New    Target Date  02/16/18            Plan - 01/22/18 1207    Clinical Impression Statement  Patient has pain and tenderness in left hamstring that is decreasing each visit. She is able to stand for very short time periods due to weakness and pain. She is able to walk longer distances but continues to have decreased standing tolerance and weakness. She will continue to benefit from PT to reach goals.    Rehab Potential  Good    PT Frequency  2x / week    PT Duration  8 weeks    PT Treatment/Interventions  Dry needling;Passive range of motion;Manual techniques;Patient/family education;Ultrasound;Cryotherapy;Electrical Stimulation;Moist Heat;Gait training;Functional mobility training;Therapeutic activities;Therapeutic exercise    PT Next Visit Plan  MH, [place], e-stim    PT Home Exercise Plan  ice, quad sets, hamstring stretch in sitting    Consulted and Agree with Plan of Care  Patient       Patient will benefit from skilled therapeutic intervention in order to improve the following deficits and impairments:  Abnormal gait, Decreased balance, Decreased endurance, Decreased mobility, Difficulty walking, Pain, Postural dysfunction, Impaired flexibility, Decreased strength, Decreased activity tolerance  Visit Diagnosis: Left knee pain, unspecified chronicity  Muscle weakness (generalized)  Difficulty in walking, not elsewhere classified     Problem List Patient Active Problem List   Diagnosis Date Noted  . Right shoulder pain 05/16/2017  . Chronic venous insufficiency 02/10/2017  . Lymphedema 02/10/2017  . Neuropathy 12/25/2016  .  Anemia due to blood loss, chronic 08/07/2016  . GERD (gastroesophageal reflux disease) 12/10/2015  . Finger pain 11/13/2015  . Carotid artery calcification 09/26/2015  . External nasal lesion 09/25/2015  . Muscle cramps 09/01/2015  . Excessive sweating 07/30/2015  . Headache 07/16/2015  . Groin pain 05/14/2015  . Muscle twitching 03/05/2015  . Leg cramps 02/11/2015  . Back pain 02/11/2015  . Abdominal pain 02/11/2015  . Acute cystitis without hematuria 12/12/2014  . Left elbow pain 11/30/2014  . Health care maintenance 11/30/2014  . Osteoporosis 10/19/2014  . Rectal bleeding 10/19/2014  . BMI 33.0-33.9,adult 09/03/2014  . Neck pain 09/03/2014  . Unsteady gait 09/03/2014  . Nocturia 09/03/2014  . Dysphagia 06/01/2014  . Nasal dryness 06/01/2014  . Stress 06/01/2014  . Fatigue 02/27/2014  . Pre-op evaluation 10/12/2013  . Hoarseness 08/14/2013  . Leg swelling 01/24/2013  . CHF (congestive heart failure) ([place]) 12/29/2012  . Cough 12/29/2012  . OSA (obstructive sleep apnea) 07/09/2012  . Osteoarthritis  07/04/2012  . Anemia 07/04/2012  . Chronic constipation 07/04/2012  . Pulmonary hypertension ([place]) 07/04/2012  . Pulmonary nodules 07/04/2012  . Hypertension 07/04/2012  . Hyperlipidemia 07/04/2012  . Hypothyroidism 07/04/2012    Alanson Puls, PT DPT 01/22/2018, 12:11 PM  [place] MAIN [facility] SERVICES [address] [place], [place], [address] Phone: [phone]   Fax:  [phone]  Name: SHAUNEE MULKERN MRN: [id number] Date of Birth: 11/11/1930

## 2018-01-23 DIAGNOSIS — J300 Vasomotor rhinitis: Secondary | ICD-10-CM | POA: Diagnosis not present

## 2018-01-23 DIAGNOSIS — R490 Dysphonia: Secondary | ICD-10-CM | POA: Diagnosis not present

## 2018-01-27 ENCOUNTER — Encounter: Payer: Self-pay | Admitting: Physical Therapy

## 2018-01-27 ENCOUNTER — Ambulatory Visit: Payer: PPO | Admitting: Physical Therapy

## 2018-01-27 DIAGNOSIS — M6281 Muscle weakness (generalized): Secondary | ICD-10-CM

## 2018-01-27 DIAGNOSIS — M25562 Pain in left knee: Secondary | ICD-10-CM

## 2018-01-27 DIAGNOSIS — R262 Difficulty in walking, not elsewhere classified: Secondary | ICD-10-CM

## 2018-01-27 NOTE — Patient Instructions (Addendum)
Knee Co-Contraction: Standing (Single Leg)    Shoulder width stance, loop band just above knee and anchor behind. Loop another just below knee and anchor in front. Perform a partial squat, then straighten knee. Repeat _10_ times per set. . Do 2__ sets per session. Do 7__ sessions per week. Anchor Height: Knee  http://tub.exer.us/38   Copyright  VHI. All rights reserved.  HIP / KNEE: Extension, Forward [employer] over high table or mat. Squeeze glutes. Raise leg up. Keep knee straight. __10_ reps per set, 2___ sets per day, _7__ days per week  Copyright  VHI. All rights reserved.  Hip Flexion / Knee Extension: Straight-Leg Raise (Eccentric)    Lie on back. Lift leg with knee straight. Slowly lower leg for 3-5 seconds. __10_ reps per set, __2_ sets per day,7 ___ days per week.  Copyright  VHI. All rights reserved.  Hip Abduction / Adduction: with Extended Knee (Supine)    Bring left leg out to side and return. Keep knee straight. Repeat 10____ times per set. Do _2___ sets per session. Do _7___ sessions per day.  http://orth.exer.us/681   Copyright  VHI. All rights reserved.  Heel Raises    Stand with support. Tighten pelvic floor and hold. With knees straight, raise heels off ground.  Repeat _10__ times. Do _2__ times a day.  Copyright  VHI. All rights reserved.  Extension: Stretch - Posterior Knee (Supine / Sitting)    Position Patient: Lie or sit with left ankle and foot supported. Helper: Place hands above and below knee joint on front of leg. Motion - Helper applies steady downward pressure in direction of straightening knee. CAUTION: Use moderate pressure to patient's tolerance. Do not allow knee to go beyond straight. Hold _30__ seconds. Repeat _3__ times. . Do __2_ sessions per day. Variation:   Copyright  VHI. All rights reserved.

## 2018-01-27 NOTE — Therapy (Signed)
[place] MAIN [facility] SERVICES [address] [place], [place], [address] Phone: [phone]   Fax:  [phone]  Physical Therapy Treatment  Patient Details  Name: Kimberly Roy MRN: [id number] Date of Birth: 1931-04-03 Referring Provider: Dereck Leep   Encounter Date: 01/27/2018  PT End of Session - 01/27/18 1223    Visit Number  8    Number of Visits  25    Date for PT Re-Evaluation  03/16/18    PT Start Time  8329    PT Stop Time  1230    PT Time Calculation (min)  45 min    Equipment Utilized During Treatment  Gait belt    Activity Tolerance  Patient tolerated treatment well;Patient limited by fatigue;Patient limited by pain    Behavior During Therapy  [facility] for tasks assessed/performed       Past Medical History:  Diagnosis Date  . Anemia   . Anxiety   . Chest pain   . CHF (congestive heart failure) ([place])   . Constipation   . DDD (degenerative disc disease), cervical   . Depression   . DVT (deep venous thrombosis) ([place])   . Dysphonia   . Dyspnea   . Fatty liver   . Fatty liver   . Headache   . Hyperlipidemia   . Hyperpiesia   . Hypertension   . Hypothyroidism   . Interstitial cystitis   . Left ventricular dysfunction   . Lymphedema   . Nephrolithiasis   . Obstructive sleep apnea   . Osteoarthritis    knees/cervical and lumbar spine  . Pulmonary hypertension ([place])   . Pulmonary nodules    followed by Dr Raul Del  . Pure hypercholesterolemia   . Renal cyst    right    Past Surgical History:  Procedure Laterality Date  . ABDOMINAL HYSTERECTOMY     ovaries left in place  . APPENDECTOMY    . Back Surgeries    . BREAST REDUCTION SURGERY     3/99  . CARDIAC CATHETERIZATION    . cataracts Bilateral   . CERVICAL SPINE SURGERY    . ESOPHAGEAL MANOMETRY N/A 08/02/2015   Procedure: ESOPHAGEAL MANOMETRY (EM);  Surgeon: Josefine Class, MD;  Location: [facility] ENDOSCOPY;  Service: Endoscopy;  Laterality: N/A;  .  ESOPHAGOGASTRODUODENOSCOPY N/A 02/27/2015   Procedure: ESOPHAGOGASTRODUODENOSCOPY (EGD);  Surgeon: Hulen Luster, MD;  Location: [facility] ENDOSCOPY;  Service: Gastroenterology;  Laterality: N/A;  . EXCISIONAL HEMORRHOIDECTOMY    . HEMORRHOID SURGERY    . HIP SURGERY  2013   Right hip surgery  . KNEE ARTHROSCOPY     left and right  . REDUCTION MAMMAPLASTY Bilateral YRS AGO  . REPLACEMENT TOTAL KNEE Bilateral   . rotator cuff surgery     blilateral  . TONSILECTOMY/ADENOIDECTOMY WITH MYRINGOTOMY      There were no vitals filed for this visit.  Subjective Assessment - 01/27/18 1216    Subjective  Pt reports she is having back pain and leg pain.  She is having 4/10 pain to left knee,     Patient is accompained by:  Family member    Pertinent History  Patient has been having left knee pain that began after the knee was replaced 4 years ago. She has been using a rollator for 4 years  and she is getting worse with her ambulation. She is having pain in her left knee 8/10 .Marland Kitchen She is taking morphine and hydrocodone for her back pain.  She has had 5 back operations . She is trying to stop taking the morphine since 1989 . She had physical therapy  4 years ago .     Limitations  Sitting    How long can you stand comfortably?  less than 5 mins    How long can you walk comfortably?  5-10 mins    Patient Stated Goals  She wants to be able to walk better and get exercise. She wants to stop using the rollator if possible and she has had it for 6 years.     Currently in Pain?  Yes    Pain Score  4     Pain Location  Knee    Pain Descriptors / Indicators  Aching    Pain Type  Chronic pain    Pain Onset  More than a month ago    Multiple Pain Sites  Yes 8/10 back        Treatment:   Seated left knee stretch x 30 sec x 3  Quad sets with 3 sec hold  Standing knee extension over pressure x 30 sec x 2   SLR x 10   Supine hip abd x 10    Manual therapy: Roller stick to hamstring x 10 mins  Edge  tool to left hamstring to reduce pain and improve mobility x 10 mins  : [place] to lateral left hip at 1. 5 cm squared x 10 mins constant wave form  STM to Left hamstring to decrease spasm and pain  Patient has less pain following therapy and decreases to 6/10. Patient is able to stand longer with less pain following treatment.                        PT Education - 01/27/18 1218    Education provided  Yes    Education Details  strengthening of L knee and stretching of left knee    Person(s) Educated  Patient    Methods  Explanation;Tactile cues;Demonstration    Comprehension  Returned demonstration;Verbalized understanding       PT Short Term Goals - 12/23/17 1027      PT SHORT TERM GOAL #1   Title  Patient will be independent in home exercise program to improve strength/mobility for better functional independence with ADLs.    Time  4    Period  Weeks    Status  New    Target Date  01/19/18      PT SHORT TERM GOAL #2   Title  Patient (> 71 years old) will complete five times sit to stand test in < 15 seconds indicating an increased LE strength and improved balance.    Time  4    Period  Weeks    Target Date  01/19/18        PT Long Term Goals - 01/27/18 1456      PT LONG TERM GOAL #1   Title  Patient will increase 10 meter walk test to >1.51ms as to improve gait speed for better community ambulation and to reduce fall risk.    Baseline  01/27/17 . 54 m/sec    Time  8    Period  Weeks    Status  Partially Met    Target Date  03/16/18      PT LONG TERM GOAL #2   Title  Patient will reduce timed up and go to <11 seconds to reduce fall risk and demonstrate improved  transfer/gait ability.    Baseline  01/27/18  21.50 sec     Time  8    Period  Weeks    Status  Partially Met    Target Date  03/16/18      PT LONG TERM GOAL #3   Title  Patient will report a worst pain of 3/10 on VAS in  left knee to improve tolerance with ADLs and reduced symptoms  with activities.     Baseline  8/10 left knee , 01/27/18 4/10     Time  8    Period  Weeks    Status  Partially Met    Target Date  03/16/18      PT LONG TERM GOAL #4   Title  Patient will be able to to ambulate with rollator for house hold distances and intermediate distances greater than 200 feet with less than 5/10 pain to left knee.     Baseline  patient ambulates 100 feet with rollator    Time  8    Period  Weeks    Status  Partially Met    Target Date  03/16/18            Plan - 01/27/18 1224    Clinical Impression Statement  Patient continues to have some left extension lack today is -13 extension. Patient has weakness in left knee and is unable to perform terminal knee extension in standing. She tolerates passive stretching in supine and standing without incrased pain. She has weakness in trunk with excessive trunk flex in standing and walking and has low tolerance for standing and walking. She is making progress towards better knee extension and now is able to begin strengthening  of left knee. Her pain level as decreasaed and she will conitnue to benefit from skilled PT to improve ROM and strength.     Rehab Potential  Good    PT Frequency  2x / week    PT Duration  8 weeks    PT Treatment/Interventions  Dry needling;Passive range of motion;Manual techniques;Patient/family education;Ultrasound;Cryotherapy;Electrical Stimulation;Moist Heat;Gait training;Functional mobility training;Therapeutic activities;Therapeutic exercise    PT Next Visit Plan  MH, [place], e-stim    PT Home Exercise Plan  ice, quad sets, hamstring stretch in sitting    Consulted and Agree with Plan of Care  Patient       Patient will benefit from skilled therapeutic intervention in order to improve the following deficits and impairments:  Abnormal gait, Decreased balance, Decreased endurance, Decreased mobility, Difficulty walking, Pain, Postural dysfunction, Impaired flexibility, Decreased strength,  Decreased activity tolerance  Visit Diagnosis: Left knee pain, unspecified chronicity  Muscle weakness (generalized)  Difficulty in walking, not elsewhere classified     Problem List Patient Active Problem List   Diagnosis Date Noted  . Right shoulder pain 05/16/2017  . Chronic venous insufficiency 02/10/2017  . Lymphedema 02/10/2017  . Neuropathy 12/25/2016  . Anemia due to blood loss, chronic 08/07/2016  . GERD (gastroesophageal reflux disease) 12/10/2015  . Finger pain 11/13/2015  . Carotid artery calcification 09/26/2015  . External nasal lesion 09/25/2015  . Muscle cramps 09/01/2015  . Excessive sweating 07/30/2015  . Headache 07/16/2015  . Groin pain 05/14/2015  . Muscle twitching 03/05/2015  . Leg cramps 02/11/2015  . Back pain 02/11/2015  . Abdominal pain 02/11/2015  . Acute cystitis without hematuria 12/12/2014  . Left elbow pain 11/30/2014  . Health care maintenance 11/30/2014  . Osteoporosis 10/19/2014  . Rectal bleeding 10/19/2014  . BMI  33.0-33.9,adult 09/03/2014  . Neck pain 09/03/2014  . Unsteady gait 09/03/2014  . Nocturia 09/03/2014  . Dysphagia 06/01/2014  . Nasal dryness 06/01/2014  . Stress 06/01/2014  . Fatigue 02/27/2014  . Pre-op evaluation 10/12/2013  . Hoarseness 08/14/2013  . Leg swelling 01/24/2013  . CHF (congestive heart failure) ([place]) 12/29/2012  . Cough 12/29/2012  . OSA (obstructive sleep apnea) 07/09/2012  . Osteoarthritis 07/04/2012  . Anemia 07/04/2012  . Chronic constipation 07/04/2012  . Pulmonary hypertension ([place]) 07/04/2012  . Pulmonary nodules 07/04/2012  . Hypertension 07/04/2012  . Hyperlipidemia 07/04/2012  . Hypothyroidism 07/04/2012    Alanson Puls, PT DPT 01/27/2018, 3:03 PM  [place] MAIN [facility] SERVICES [address] [place], [place], [address] Phone: [phone]   Fax:  [phone]  Name: STAR RESLER MRN: [id number] Date of Birth: May 27, 1931

## 2018-01-28 DIAGNOSIS — J449 Chronic obstructive pulmonary disease, unspecified: Secondary | ICD-10-CM | POA: Diagnosis not present

## 2018-01-29 ENCOUNTER — Encounter: Payer: Self-pay | Admitting: Physical Therapy

## 2018-01-29 ENCOUNTER — Ambulatory Visit: Payer: PPO | Admitting: Physical Therapy

## 2018-01-29 DIAGNOSIS — M25562 Pain in left knee: Secondary | ICD-10-CM | POA: Diagnosis not present

## 2018-01-29 DIAGNOSIS — M6281 Muscle weakness (generalized): Secondary | ICD-10-CM

## 2018-01-29 DIAGNOSIS — R262 Difficulty in walking, not elsewhere classified: Secondary | ICD-10-CM

## 2018-01-29 NOTE — Therapy (Signed)
[place] MAIN [facility] SERVICES [address] [place], [place], [address] Phone: [phone]   Fax:  [phone]  Physical Therapy Treatment  Patient Details  Name: Kimberly Roy MRN: [id number] Date of Birth: 02/16/1931 Referring Provider: Dereck Leep   Encounter Date: 01/29/2018  PT End of Session - 01/29/18 1203    Visit Number  9    Number of Visits  25    Date for PT Re-Evaluation  03/16/18    PT Start Time  1140    PT Stop Time  1220    PT Time Calculation (min)  40 min    Equipment Utilized During Treatment  Gait belt    Activity Tolerance  Patient tolerated treatment well;Patient limited by fatigue;Patient limited by pain    Behavior During Therapy  [facility] for tasks assessed/performed       Past Medical History:  Diagnosis Date  . Anemia   . Anxiety   . Chest pain   . CHF (congestive heart failure) ([place])   . Constipation   . DDD (degenerative disc disease), cervical   . Depression   . DVT (deep venous thrombosis) ([place])   . Dysphonia   . Dyspnea   . Fatty liver   . Fatty liver   . Headache   . Hyperlipidemia   . Hyperpiesia   . Hypertension   . Hypothyroidism   . Interstitial cystitis   . Left ventricular dysfunction   . Lymphedema   . Nephrolithiasis   . Obstructive sleep apnea   . Osteoarthritis    knees/cervical and lumbar spine  . Pulmonary hypertension ([place])   . Pulmonary nodules    followed by Dr Raul Del  . Pure hypercholesterolemia   . Renal cyst    right    Past Surgical History:  Procedure Laterality Date  . ABDOMINAL HYSTERECTOMY     ovaries left in place  . APPENDECTOMY    . Back Surgeries    . BREAST REDUCTION SURGERY     3/99  . CARDIAC CATHETERIZATION    . cataracts Bilateral   . CERVICAL SPINE SURGERY    . ESOPHAGEAL MANOMETRY N/A 08/02/2015   Procedure: ESOPHAGEAL MANOMETRY (EM);  Surgeon: Josefine Class, MD;  Location: [facility] ENDOSCOPY;  Service: Endoscopy;  Laterality: N/A;  .  ESOPHAGOGASTRODUODENOSCOPY N/A 02/27/2015   Procedure: ESOPHAGOGASTRODUODENOSCOPY (EGD);  Surgeon: Hulen Luster, MD;  Location: [facility] ENDOSCOPY;  Service: Gastroenterology;  Laterality: N/A;  . EXCISIONAL HEMORRHOIDECTOMY    . HEMORRHOID SURGERY    . HIP SURGERY  2013   Right hip surgery  . KNEE ARTHROSCOPY     left and right  . REDUCTION MAMMAPLASTY Bilateral YRS AGO  . REPLACEMENT TOTAL KNEE Bilateral   . rotator cuff surgery     blilateral  . TONSILECTOMY/ADENOIDECTOMY WITH MYRINGOTOMY      There were no vitals filed for this visit.  Subjective Assessment - 01/29/18 1202    Subjective  Pt reports she is having back pain and back of her knee pain.  She is having 2/10 pain to left knee,     Patient is accompained by:  Family member    Pertinent History  Patient has been having left knee pain that began after the knee was replaced 4 years ago. She has been using a rollator for 4 years  and she is getting worse with her ambulation. She is having pain in her left knee 8/10 .Marland Kitchen She is taking morphine and hydrocodone for  her back pain. She has had 5 back operations . She is trying to stop taking the morphine since 1989 . She had physical therapy  4 years ago .     Limitations  Sitting    How long can you stand comfortably?  less than 5 mins    How long can you walk comfortably?  5-10 mins    Patient Stated Goals  She wants to be able to walk better and get exercise. She wants to stop using the rollator if possible and she has had it for 6 years.     Currently in Pain?  Yes    Pain Score  2     Pain Location  Knee    Pain Orientation  Left    Pain Descriptors / Indicators  Tender    Pain Type  Chronic pain    Pain Onset  More than a month ago          Treatment: LLE : SLR x 10 SAQ with 4 lbs x 20  Seated left knee stretch x 30 sec x 3 Quad sets with 3 sec hold Standing knee extension over pressure x 30 sec x 2  Supine hip abd x 10  Standing with left knee extension and pulling with  RTB x 10 with 5 sec stretch x 2 sets  Manual therapy: Roller stick to hamstring x 10 mins [place] to lateral left hip at 1. 5 cm squared x 10 mins constant wave form STM to Left hamstring to decrease spasm and pain  Patient has less pain following therapy and decreases to 1/10. Patient is able to stand longer with less pain following treatment.                      PT Education - 01/29/18 1202    Education provided  Yes    Education Details  HEP    Person(s) Educated  Patient    Methods  Explanation    Comprehension  Verbalized understanding       PT Short Term Goals - 12/23/17 1027      PT SHORT TERM GOAL #1   Title  Patient will be independent in home exercise program to improve strength/mobility for better functional independence with ADLs.    Time  4    Period  Weeks    Status  New    Target Date  01/19/18      PT SHORT TERM GOAL #2   Title  Patient (> 72 years old) will complete five times sit to stand test in < 15 seconds indicating an increased LE strength and improved balance.    Time  4    Period  Weeks    Target Date  01/19/18        PT Long Term Goals - 01/27/18 1456      PT LONG TERM GOAL #1   Title  Patient will increase 10 meter walk test to >1.58ms as to improve gait speed for better community ambulation and to reduce fall risk.    Baseline  01/27/17 . 54 m/sec    Time  8    Period  Weeks    Status  Partially Met    Target Date  03/16/18      PT LONG TERM GOAL #2   Title  Patient will reduce timed up and go to <11 seconds to reduce fall risk and demonstrate improved transfer/gait ability.    Baseline  01/27/18  21.50 sec     Time  8    Period  Weeks    Status  Partially Met    Target Date  03/16/18      PT LONG TERM GOAL #3   Title  Patient will report a worst pain of 3/10 on VAS in  left knee to improve tolerance with ADLs and reduced symptoms with activities.     Baseline  8/10 left knee , 01/27/18 4/10     Time  8    Period   Weeks    Status  Partially Met    Target Date  03/16/18      PT LONG TERM GOAL #4   Title  Patient will be able to to ambulate with rollator for house hold distances and intermediate distances greater than 200 feet with less than 5/10 pain to left knee.     Baseline  patient ambulates 100 feet with rollator    Time  8    Period  Weeks    Status  Partially Met    Target Date  03/16/18            Plan - 01/29/18 1203    Clinical Impression Statement  Patient continues to have some left extension lack today, with left knee weakness. . Patient has weakness in left knee and is unable to perform terminal knee extension in standing. She tolerates passive stretching in supine and standing without incrased pain. She has weakness in trunk with excessive trunk flex in standing and walking and has low tolerance for standing and walking. She is making progress towards better knee extension and now is able to begin strengthening of left knee. Her pain level as decreasaed and she will conitnue to benefit from skilled PT to improve ROM and strength.    Rehab Potential  Good    PT Frequency  2x / week    PT Duration  8 weeks    PT Treatment/Interventions  Dry needling;Passive range of motion;Manual techniques;Patient/family education;Ultrasound;Cryotherapy;Electrical Stimulation;Moist Heat;Gait training;Functional mobility training;Therapeutic activities;Therapeutic exercise    PT Next Visit Plan  MH, [place], e-stim    PT Home Exercise Plan  ice, quad sets, hamstring stretch in sitting    Consulted and Agree with Plan of Care  Patient       Patient will benefit from skilled therapeutic intervention in order to improve the following deficits and impairments:  Abnormal gait, Decreased balance, Decreased endurance, Decreased mobility, Difficulty walking, Pain, Postural dysfunction, Impaired flexibility, Decreased strength, Decreased activity tolerance  Visit Diagnosis: Left knee pain, unspecified  chronicity  Muscle weakness (generalized)  Difficulty in walking, not elsewhere classified     Problem List Patient Active Problem List   Diagnosis Date Noted  . Right shoulder pain 05/16/2017  . Chronic venous insufficiency 02/10/2017  . Lymphedema 02/10/2017  . Neuropathy 12/25/2016  . Anemia due to blood loss, chronic 08/07/2016  . GERD (gastroesophageal reflux disease) 12/10/2015  . Finger pain 11/13/2015  . Carotid artery calcification 09/26/2015  . External nasal lesion 09/25/2015  . Muscle cramps 09/01/2015  . Excessive sweating 07/30/2015  . Headache 07/16/2015  . Groin pain 05/14/2015  . Muscle twitching 03/05/2015  . Leg cramps 02/11/2015  . Back pain 02/11/2015  . Abdominal pain 02/11/2015  . Acute cystitis without hematuria 12/12/2014  . Left elbow pain 11/30/2014  . Health care maintenance 11/30/2014  . Osteoporosis 10/19/2014  . Rectal bleeding 10/19/2014  . BMI 33.0-33.9,adult 09/03/2014  . Neck pain 09/03/2014  .  Unsteady gait 09/03/2014  . Nocturia 09/03/2014  . Dysphagia 06/01/2014  . Nasal dryness 06/01/2014  . Stress 06/01/2014  . Fatigue 02/27/2014  . Pre-op evaluation 10/12/2013  . Hoarseness 08/14/2013  . Leg swelling 01/24/2013  . CHF (congestive heart failure) ([place]) 12/29/2012  . Cough 12/29/2012  . OSA (obstructive sleep apnea) 07/09/2012  . Osteoarthritis 07/04/2012  . Anemia 07/04/2012  . Chronic constipation 07/04/2012  . Pulmonary hypertension ([place]) 07/04/2012  . Pulmonary nodules 07/04/2012  . Hypertension 07/04/2012  . Hyperlipidemia 07/04/2012  . Hypothyroidism 07/04/2012    Alanson Puls, PT DPT 01/29/2018, 12:05 PM  [place] MAIN [facility] SERVICES [address] [place], [place], [address] Phone: [phone]   Fax:  [phone]  Name: Kimberly Roy MRN: [id number] Date of Birth: 1931/07/23

## 2018-02-03 ENCOUNTER — Encounter: Payer: Self-pay | Admitting: Physical Therapy

## 2018-02-03 ENCOUNTER — Ambulatory Visit: Payer: PPO | Attending: Orthopedic Surgery | Admitting: Physical Therapy

## 2018-02-03 DIAGNOSIS — R262 Difficulty in walking, not elsewhere classified: Secondary | ICD-10-CM

## 2018-02-03 DIAGNOSIS — M25562 Pain in left knee: Secondary | ICD-10-CM

## 2018-02-03 DIAGNOSIS — M6281 Muscle weakness (generalized): Secondary | ICD-10-CM

## 2018-02-03 NOTE — Therapy (Signed)
[place] MAIN [facility] SERVICES [address] [place], [place], [address] Phone: [phone]   Fax:  [phone]  Physical Therapy Treatment Physical Therapy Progress Note   Dates of reporting period  12/22/17  To 02/03/18 Patient Details  Name: Kimberly Roy MRN: [id number] Date of Birth: 1931/07/26 Referring Provider: Dereck Leep   Encounter Date: 02/03/2018  PT End of Session - 02/03/18 1403    Visit Number  10    Number of Visits  25    Date for PT Re-Evaluation  03/16/18    PT Start Time  0145    PT Stop Time  0230    PT Time Calculation (min)  45 min    Equipment Utilized During Treatment  Gait belt    Activity Tolerance  Patient tolerated treatment well;Patient limited by fatigue;Patient limited by pain    Behavior During Therapy  WFL for tasks assessed/performed       Past Medical History:  Diagnosis Date  . Anemia   . Anxiety   . Chest pain   . CHF (congestive heart failure) ([place])   . Constipation   . DDD (degenerative disc disease), cervical   . Depression   . DVT (deep venous thrombosis) ([place])   . Dysphonia   . Dyspnea   . Fatty liver   . Fatty liver   . Headache   . Hyperlipidemia   . Hyperpiesia   . Hypertension   . Hypothyroidism   . Interstitial cystitis   . Left ventricular dysfunction   . Lymphedema   . Nephrolithiasis   . Obstructive sleep apnea   . Osteoarthritis    knees/cervical and lumbar spine  . Pulmonary hypertension ([place])   . Pulmonary nodules    followed by Dr Raul Del  . Pure hypercholesterolemia   . Renal cyst    right    Past Surgical History:  Procedure Laterality Date  . ABDOMINAL HYSTERECTOMY     ovaries left in place  . APPENDECTOMY    . Back Surgeries    . BREAST REDUCTION SURGERY     3/99  . CARDIAC CATHETERIZATION    . cataracts Bilateral   . CERVICAL SPINE SURGERY    . ESOPHAGEAL MANOMETRY N/A 08/02/2015   Procedure: ESOPHAGEAL MANOMETRY (EM);  Surgeon: Josefine Class,  MD;  Location: [facility] ENDOSCOPY;  Service: Endoscopy;  Laterality: N/A;  . ESOPHAGOGASTRODUODENOSCOPY N/A 02/27/2015   Procedure: ESOPHAGOGASTRODUODENOSCOPY (EGD);  Surgeon: Hulen Luster, MD;  Location: [facility] ENDOSCOPY;  Service: Gastroenterology;  Laterality: N/A;  . EXCISIONAL HEMORRHOIDECTOMY    . HEMORRHOID SURGERY    . HIP SURGERY  2013   Right hip surgery  . KNEE ARTHROSCOPY     left and right  . REDUCTION MAMMAPLASTY Bilateral YRS AGO  . REPLACEMENT TOTAL KNEE Bilateral   . rotator cuff surgery     blilateral  . TONSILECTOMY/ADENOIDECTOMY WITH MYRINGOTOMY      There were no vitals filed for this visit.  Subjective Assessment - 02/03/18 1401    Subjective  Patient reports that her knee is not hurting today, just her back.    Patient is accompained by:  Family member    Pertinent History  Patient has been having left knee pain that began after the knee was replaced 4 years ago. She has been using a rollator for 4 years  and she is getting worse with her ambulation. She is having pain in her left knee 8/10 .Marland Kitchen She is taking morphine  and hydrocodone for her back pain. She has had 5 back operations . She is trying to stop taking the morphine since 1989 . She had physical therapy  4 years ago .     Limitations  Sitting    How long can you stand comfortably?  less than 5 mins    How long can you walk comfortably?  5-10 mins    Patient Stated Goals  She wants to be able to walk better and get exercise. She wants to stop using the rollator if possible and she has had it for 6 years.     Currently in Pain?  Yes    Pain Score  7     Pain Location  Back    Pain Descriptors / Indicators  Aching    Pain Type  Chronic pain    Pain Onset  More than a month ago    Aggravating Factors   standing, lying down    Effect of Pain on Daily Activities  unable to stand long    Multiple Pain Sites  No       Therapeutic activities: Ambulating in parallel bars x 40 feet  Ambulating with rollator SBA x 50  feet Transfer from wc to mat, wc to stand and stand to prone lying x 5 mins BLE hip flexor stretch x 30 sec x 1 Standing with left knee extension and stepping fwd with RLE past the LLE x 10   Manual therapy:  [place] to lateral left hip at 1. 5 cm squared x 10 mins constant wave form STM to Left hamstring to decrease spasm and pain  Patient reports back pain has increased following prone lying but does not rate it. She is able to get from prone to standing and then sit in the wc with sBA.                         PT Education - 02/03/18 1402    Education provided  Yes    Education Details  HEP    Person(s) Educated  Patient    Methods  Explanation    Comprehension  Verbalized understanding       PT Short Term Goals - 12/23/17 1027      PT SHORT TERM GOAL #1   Title  Patient will be independent in home exercise program to improve strength/mobility for better functional independence with ADLs.    Time  4    Period  Weeks    Status  New    Target Date  01/19/18      PT SHORT TERM GOAL #2   Title  Patient (> 88 years old) will complete five times sit to stand test in < 15 seconds indicating an increased LE strength and improved balance.    Time  4    Period  Weeks    Target Date  01/19/18        PT Long Term Goals - 01/27/18 1456      PT LONG TERM GOAL #1   Title  Patient will increase 10 meter walk test to >1.31ms as to improve gait speed for better community ambulation and to reduce fall risk.    Baseline  01/27/17 . 54 m/sec    Time  8    Period  Weeks    Status  Partially Met    Target Date  03/16/18      PT LONG TERM GOAL #2   Title  Patient will reduce timed up and go to <11 seconds to reduce fall risk and demonstrate improved transfer/gait ability.    Baseline  01/27/18  21.50 sec     Time  8    Period  Weeks    Status  Partially Met    Target Date  03/16/18      PT LONG TERM GOAL #3   Title  Patient will report a worst pain of 3/10 on VAS  in  left knee to improve tolerance with ADLs and reduced symptoms with activities.     Baseline  8/10 left knee , 01/27/18 4/10     Time  8    Period  Weeks    Status  Partially Met    Target Date  03/16/18      PT LONG TERM GOAL #4   Title  Patient will be able to to ambulate with rollator for house hold distances and intermediate distances greater than 200 feet with less than 5/10 pain to left knee.     Baseline  patient ambulates 100 feet with rollator    Time  8    Period  Weeks    Status  Partially Met    Target Date  03/16/18            Plan - 02/03/18 1403    Clinical Impression Statement Patient's condition has the potential to improve in response to therapy. Maximum improvement is yet to be obtained. The anticipated improvement is attainable and reasonable in a generally predictable time. Start date of reporting period 12/22/17 end date of reporting period 02/03/18.  Patient is able to ambulate longer distances with her rollator. She reports no left knee pain . She ambulates in parallel bars 40 feet and with rollator for 50 feet. She is not able to keep her left knee in extension and keep her hips in extension during standing to stand up straight. She attempts to get ito a straight position in prone but has increased back pain following 5 mins in prone. She is able to perform all transfers independently from prone to standing and sit to stand from wc to standing position. She continues to have left knee extension weakness and is not able to step with right leg and keep her left knee straight without UE assist. She has multiple comorbities including back weakness and core weakness not allowing her to stand up straight. She has not tried to lay down in several months. She will continue to benefti from skilled PT to strengthen her knee and improve her mobility.     Rehab Potential  Good    PT Frequency  2x / week    PT Duration  8 weeks    PT Treatment/Interventions  Dry needling;Passive  range of motion;Manual techniques;Patient/family education;Ultrasound;Cryotherapy;Electrical Stimulation;Moist Heat;Gait training;Functional mobility training;Therapeutic activities;Therapeutic exercise    PT Next Visit Plan  MH, [place], e-stim    PT Home Exercise Plan  ice, quad sets, hamstring stretch in sitting    Consulted and Agree with Plan of Care  Patient       Patient will benefit from skilled therapeutic intervention in order to improve the following deficits and impairments:  Abnormal gait, Decreased balance, Decreased endurance, Decreased mobility, Difficulty walking, Pain, Postural dysfunction, Impaired flexibility, Decreased strength, Decreased activity tolerance  Visit Diagnosis: Left knee pain, unspecified chronicity  Muscle weakness (generalized)  Difficulty in walking, not elsewhere classified     Problem List Patient Active Problem List   Diagnosis Date  Noted  . Right shoulder pain 05/16/2017  . Chronic venous insufficiency 02/10/2017  . Lymphedema 02/10/2017  . Neuropathy 12/25/2016  . Anemia due to blood loss, chronic 08/07/2016  . GERD (gastroesophageal reflux disease) 12/10/2015  . Finger pain 11/13/2015  . Carotid artery calcification 09/26/2015  . External nasal lesion 09/25/2015  . Muscle cramps 09/01/2015  . Excessive sweating 07/30/2015  . Headache 07/16/2015  . Groin pain 05/14/2015  . Muscle twitching 03/05/2015  . Leg cramps 02/11/2015  . Back pain 02/11/2015  . Abdominal pain 02/11/2015  . Acute cystitis without hematuria 12/12/2014  . Left elbow pain 11/30/2014  . Health care maintenance 11/30/2014  . Osteoporosis 10/19/2014  . Rectal bleeding 10/19/2014  . BMI 33.0-33.9,adult 09/03/2014  . Neck pain 09/03/2014  . Unsteady gait 09/03/2014  . Nocturia 09/03/2014  . Dysphagia 06/01/2014  . Nasal dryness 06/01/2014  . Stress 06/01/2014  . Fatigue 02/27/2014  . Pre-op evaluation 10/12/2013  . Hoarseness 08/14/2013  . Leg swelling  01/24/2013  . CHF (congestive heart failure) ([place]) 12/29/2012  . Cough 12/29/2012  . OSA (obstructive sleep apnea) 07/09/2012  . Osteoarthritis 07/04/2012  . Anemia 07/04/2012  . Chronic constipation 07/04/2012  . Pulmonary hypertension ([place]) 07/04/2012  . Pulmonary nodules 07/04/2012  . Hypertension 07/04/2012  . Hyperlipidemia 07/04/2012  . Hypothyroidism 07/04/2012    Alanson Puls, PT DPT 02/03/2018, 2:10 PM  [place] MAIN [facility] SERVICES [address] [place], [place], [address] Phone: [phone]   Fax:  [phone]  Name: Kimberly Roy MRN: [id number] Date of Birth: 1931/06/12

## 2018-02-05 ENCOUNTER — Ambulatory Visit: Payer: PPO | Admitting: Physical Therapy

## 2018-02-10 ENCOUNTER — Encounter: Payer: Self-pay | Admitting: Physical Therapy

## 2018-02-10 ENCOUNTER — Ambulatory Visit: Payer: PPO | Admitting: Physical Therapy

## 2018-02-10 ENCOUNTER — Ambulatory Visit (INDEPENDENT_AMBULATORY_CARE_PROVIDER_SITE_OTHER): Payer: PPO | Admitting: Internal Medicine

## 2018-02-10 ENCOUNTER — Encounter: Payer: Self-pay | Admitting: Internal Medicine

## 2018-02-10 DIAGNOSIS — F439 Reaction to severe stress, unspecified: Secondary | ICD-10-CM

## 2018-02-10 DIAGNOSIS — E039 Hypothyroidism, unspecified: Secondary | ICD-10-CM

## 2018-02-10 DIAGNOSIS — K219 Gastro-esophageal reflux disease without esophagitis: Secondary | ICD-10-CM | POA: Diagnosis not present

## 2018-02-10 DIAGNOSIS — I872 Venous insufficiency (chronic) (peripheral): Secondary | ICD-10-CM | POA: Diagnosis not present

## 2018-02-10 DIAGNOSIS — G4733 Obstructive sleep apnea (adult) (pediatric): Secondary | ICD-10-CM | POA: Diagnosis not present

## 2018-02-10 DIAGNOSIS — M5440 Lumbago with sciatica, unspecified side: Secondary | ICD-10-CM | POA: Diagnosis not present

## 2018-02-10 DIAGNOSIS — G629 Polyneuropathy, unspecified: Secondary | ICD-10-CM

## 2018-02-10 DIAGNOSIS — Z6833 Body mass index (BMI) 33.0-33.9, adult: Secondary | ICD-10-CM | POA: Diagnosis not present

## 2018-02-10 DIAGNOSIS — G8929 Other chronic pain: Secondary | ICD-10-CM

## 2018-02-10 DIAGNOSIS — I10 Essential (primary) hypertension: Secondary | ICD-10-CM

## 2018-02-10 DIAGNOSIS — R918 Other nonspecific abnormal finding of lung field: Secondary | ICD-10-CM

## 2018-02-10 DIAGNOSIS — I272 Pulmonary hypertension, unspecified: Secondary | ICD-10-CM

## 2018-02-10 DIAGNOSIS — R262 Difficulty in walking, not elsewhere classified: Secondary | ICD-10-CM

## 2018-02-10 DIAGNOSIS — E785 Hyperlipidemia, unspecified: Secondary | ICD-10-CM

## 2018-02-10 DIAGNOSIS — D649 Anemia, unspecified: Secondary | ICD-10-CM

## 2018-02-10 DIAGNOSIS — I509 Heart failure, unspecified: Secondary | ICD-10-CM

## 2018-02-10 DIAGNOSIS — M6281 Muscle weakness (generalized): Secondary | ICD-10-CM

## 2018-02-10 DIAGNOSIS — M25562 Pain in left knee: Secondary | ICD-10-CM | POA: Diagnosis not present

## 2018-02-10 NOTE — Therapy (Signed)
[place] MAIN [facility] SERVICES [address] [place], [place], [address] Phone: [phone]   Fax:  [phone]  Physical Therapy Treatment  Patient Details  Name: Kimberly Roy MRN: [id number] Date of Birth: 02/07/1931 Referring Provider: Dereck Leep   Encounter Date: 02/10/2018  PT End of Session - 02/10/18 1347    Visit Number  11    Number of Visits  25    Date for PT Re-Evaluation  03/16/18    PT Start Time  0140    PT Stop Time  0218    PT Time Calculation (min)  38 min    Equipment Utilized During Treatment  Gait belt    Activity Tolerance  Patient tolerated treatment well;Patient limited by fatigue;Patient limited by pain    Behavior During Therapy  [facility] for tasks assessed/performed       Past Medical History:  Diagnosis Date  . Anemia   . Anxiety   . Chest pain   . CHF (congestive heart failure) ([place])   . Constipation   . DDD (degenerative disc disease), cervical   . Depression   . DVT (deep venous thrombosis) ([place])   . Dysphonia   . Dyspnea   . Fatty liver   . Fatty liver   . Headache   . Hyperlipidemia   . Hyperpiesia   . Hypertension   . Hypothyroidism   . Interstitial cystitis   . Left ventricular dysfunction   . Lymphedema   . Nephrolithiasis   . Obstructive sleep apnea   . Osteoarthritis    knees/cervical and lumbar spine  . Pulmonary hypertension ([place])   . Pulmonary nodules    followed by Dr Raul Del  . Pure hypercholesterolemia   . Renal cyst    right    Past Surgical History:  Procedure Laterality Date  . ABDOMINAL HYSTERECTOMY     ovaries left in place  . APPENDECTOMY    . Back Surgeries    . BREAST REDUCTION SURGERY     3/99  . CARDIAC CATHETERIZATION    . cataracts Bilateral   . CERVICAL SPINE SURGERY    . ESOPHAGEAL MANOMETRY N/A 08/02/2015   Procedure: ESOPHAGEAL MANOMETRY (EM);  Surgeon: Josefine Class, MD;  Location: [facility] ENDOSCOPY;  Service: Endoscopy;  Laterality: N/A;  .  ESOPHAGOGASTRODUODENOSCOPY N/A 02/27/2015   Procedure: ESOPHAGOGASTRODUODENOSCOPY (EGD);  Surgeon: Hulen Luster, MD;  Location: [facility] ENDOSCOPY;  Service: Gastroenterology;  Laterality: N/A;  . EXCISIONAL HEMORRHOIDECTOMY    . HEMORRHOID SURGERY    . HIP SURGERY  2013   Right hip surgery  . KNEE ARTHROSCOPY     left and right  . REDUCTION MAMMAPLASTY Bilateral YRS AGO  . REPLACEMENT TOTAL KNEE Bilateral   . rotator cuff surgery     blilateral  . TONSILECTOMY/ADENOIDECTOMY WITH MYRINGOTOMY      There were no vitals filed for this visit.  Subjective Assessment - 02/10/18 1345    Subjective  Patient reports that she has been busy because of her husband being in the hospital following a stroke and she is visiting him and is having to do more at home.     Patient is accompained by:  Family member    Pertinent History  Patient has been having left knee pain that began after the knee was replaced 4 years ago. She has been using a rollator for 4 years  and she is getting worse with her ambulation. She is having pain in her left knee  8/10 .. She is taking morphine and hydrocodone for her back pain. She has had 5 back operations . She is trying to stop taking the morphine since 1989 . She had physical therapy  4 years ago .     Limitations  Sitting    How long can you stand comfortably?  less than 5 mins    How long can you walk comfortably?  5-10 mins    Patient Stated Goals  She wants to be able to walk better and get exercise. She wants to stop using the rollator if possible and she has had it for 6 years.     Currently in Pain?  Yes    Pain Score  5     Pain Location  Knee    Pain Orientation  Left    Pain Descriptors / Indicators  Aching    Pain Onset  More than a month ago      Treatment: LAQ x 20 sititng  Supine quad sets x 15 with 5 sec hold SAQ with 2 lbs x 20 x 3 Standing with rollator and keeping her trunk straight, left knee straight and head up x 1 min, 2 mins 45 sec, 2 mins 15  seconds Patient ambulates with rollator with 100 feet and cues to straighten her left knee during stance; patient lacks 15 deg of extension in left knee    Patient has fatigue after standing and walking with her muscles trembling, she recovers with a rest period.                       PT Education - 02/10/18 1346    Education provided  Yes    Education Details  HEP    Person(s) Educated  Patient    Methods  Explanation    Comprehension  Verbalized understanding       PT Short Term Goals - 12/23/17 1027      PT SHORT TERM GOAL #1   Title  Patient will be independent in home exercise program to improve strength/mobility for better functional independence with ADLs.    Time  4    Period  Weeks    Status  New    Target Date  01/19/18      PT SHORT TERM GOAL #2   Title  Patient (> 66 years old) will complete five times sit to stand test in < 15 seconds indicating an increased LE strength and improved balance.    Time  4    Period  Weeks    Target Date  01/19/18        PT Long Term Goals - 01/27/18 1456      PT LONG TERM GOAL #1   Title  Patient will increase 10 meter walk test to >1.51ms as to improve gait speed for better community ambulation and to reduce fall risk.    Baseline  01/27/17 . 54 m/sec    Time  8    Period  Weeks    Status  Partially Met    Target Date  03/16/18      PT LONG TERM GOAL #2   Title  Patient will reduce timed up and go to <11 seconds to reduce fall risk and demonstrate improved transfer/gait ability.    Baseline  01/27/18  21.50 sec     Time  8    Period  Weeks    Status  Partially Met    Target Date  03/16/18  PT LONG TERM GOAL #3   Title  Patient will report a worst pain of 3/10 on VAS in  left knee to improve tolerance with ADLs and reduced symptoms with activities.     Baseline  8/10 left knee , 01/27/18 4/10     Time  8    Period  Weeks    Status  Partially Met    Target Date  03/16/18      PT LONG TERM GOAL  #4   Title  Patient will be able to to ambulate with rollator for house hold distances and intermediate distances greater than 200 feet with less than 5/10 pain to left knee.     Baseline  patient ambulates 100 feet with rollator    Time  8    Period  Weeks    Status  Partially Met    Target Date  03/16/18            Plan - 02/10/18 1347    Clinical Impression Statement  Patient continues to have weakness in left knee and is not able to stand and keep her left knee in extension during weight bearing. She has 5/10 knee pain and her trunk is severly flexed and she ambulates with rollator with poor posture that is weverly flexed. She will continue to benefit from skilled PT to improve left knee strength to improve her mobility.     Rehab Potential  Good    PT Frequency  2x / week    PT Duration  8 weeks    PT Treatment/Interventions  Dry needling;Passive range of motion;Manual techniques;Patient/family education;Ultrasound;Cryotherapy;Electrical Stimulation;Moist Heat;Gait training;Functional mobility training;Therapeutic activities;Therapeutic exercise    PT Next Visit Plan  MH, [place], e-stim    PT Home Exercise Plan  ice, quad sets, hamstring stretch in sitting    Consulted and Agree with Plan of Care  Patient       Patient will benefit from skilled therapeutic intervention in order to improve the following deficits and impairments:  Abnormal gait, Decreased balance, Decreased endurance, Decreased mobility, Difficulty walking, Pain, Postural dysfunction, Impaired flexibility, Decreased strength, Decreased activity tolerance  Visit Diagnosis: Left knee pain, unspecified chronicity  Muscle weakness (generalized)  Difficulty in walking, not elsewhere classified     Problem List Patient Active Problem List   Diagnosis Date Noted  . Right shoulder pain 05/16/2017  . Chronic venous insufficiency 02/10/2017  . Lymphedema 02/10/2017  . Neuropathy 12/25/2016  . Anemia due to blood  loss, chronic 08/07/2016  . GERD (gastroesophageal reflux disease) 12/10/2015  . Finger pain 11/13/2015  . Carotid artery calcification 09/26/2015  . External nasal lesion 09/25/2015  . Muscle cramps 09/01/2015  . Excessive sweating 07/30/2015  . Headache 07/16/2015  . Groin pain 05/14/2015  . Muscle twitching 03/05/2015  . Leg cramps 02/11/2015  . Back pain 02/11/2015  . Abdominal pain 02/11/2015  . Acute cystitis without hematuria 12/12/2014  . Left elbow pain 11/30/2014  . Health care maintenance 11/30/2014  . Osteoporosis 10/19/2014  . Rectal bleeding 10/19/2014  . BMI 33.0-33.9,adult 09/03/2014  . Neck pain 09/03/2014  . Unsteady gait 09/03/2014  . Nocturia 09/03/2014  . Dysphagia 06/01/2014  . Nasal dryness 06/01/2014  . Stress 06/01/2014  . Fatigue 02/27/2014  . Pre-op evaluation 10/12/2013  . Hoarseness 08/14/2013  . Leg swelling 01/24/2013  . CHF (congestive heart failure) ([place]) 12/29/2012  . Cough 12/29/2012  . OSA (obstructive sleep apnea) 07/09/2012  . Osteoarthritis 07/04/2012  . Anemia 07/04/2012  . Chronic  constipation 07/04/2012  . Pulmonary hypertension ([place]) 07/04/2012  . Pulmonary nodules 07/04/2012  . Hypertension 07/04/2012  . Hyperlipidemia 07/04/2012  . Hypothyroidism 07/04/2012    Alanson Puls, PT DPT 02/10/2018, 1:54 PM  [place] MAIN [facility] SERVICES [address] [place], [place], [address] Phone: [phone]   Fax:  [phone]  Name: Kimberly Roy MRN: [id number] Date of Birth: April 04, 1931

## 2018-02-10 NOTE — Progress Notes (Signed)
Patient ID: DANIAH ZALDIVAR, female   DOB: [birth year], 84 y.o.   MRN: [id number]   Subjective:    Patient ID: [name], female    DOB: [birth year], 84 y.o.   MRN: [id number]  HPI  Patient here for a scheduled follow up.  Some increased stress recently with her husband's health issues.  He recently had a stroke and is in rehab.  She overall feels she is handling things relatively well.  Seeing Dr Nicolasa Ducking.  On cymbalta.  Buspar added.  She does not feel needs anything more at this time.  Has chronic pain.  Being followed by pain clinic and ortho.  She was also recently evaluated by Dr Raul Del for f/u pulmonary hypertension.  Recommended continuing CPAP and weight loss.  Also recommended continued cardiology f/u.  Also reported stable pulmonary nodules.  No further chest CT warranted.  Also saw Dr Marry Guan for shoulder pain.  S/p injection.  No chest pain.  Breathing stable.  No acid reflux.  No abdominal pain.     Past Medical History:  Diagnosis Date  . Anemia   . Anxiety   . Chest pain   . CHF (congestive heart failure) ([place])   . Constipation   . DDD (degenerative disc disease), cervical   . Depression   . DVT (deep venous thrombosis) ([place])   . Dysphonia   . Dyspnea   . Fatty liver   . Fatty liver   . Headache   . Hyperlipidemia   . Hyperpiesia   . Hypertension   . Hypothyroidism   . Interstitial cystitis   . Left ventricular dysfunction   . Lymphedema   . Nephrolithiasis   . Obstructive sleep apnea   . Osteoarthritis    knees/cervical and lumbar spine  . Pulmonary hypertension ([place])   . Pulmonary nodules    followed by Dr Raul Del  . Pure hypercholesterolemia   . Renal cyst    right   Past Surgical History:  Procedure Laterality Date  . ABDOMINAL HYSTERECTOMY     ovaries left in place  . APPENDECTOMY    . Back Surgeries    . BREAST REDUCTION SURGERY     3/99  . CARDIAC CATHETERIZATION    . cataracts Bilateral   . CERVICAL SPINE SURGERY    . ESOPHAGEAL MANOMETRY N/A  08/02/2015   Procedure: ESOPHAGEAL MANOMETRY (EM);  Surgeon: Josefine Class, MD;  Location: [facility] ENDOSCOPY;  Service: Endoscopy;  Laterality: N/A;  . ESOPHAGOGASTRODUODENOSCOPY N/A 02/27/2015   Procedure: ESOPHAGOGASTRODUODENOSCOPY (EGD);  Surgeon: Hulen Luster, MD;  Location: [facility] ENDOSCOPY;  Service: Gastroenterology;  Laterality: N/A;  . EXCISIONAL HEMORRHOIDECTOMY    . HEMORRHOID SURGERY    . HIP SURGERY  2013   Right hip surgery  . KNEE ARTHROSCOPY     left and right  . REDUCTION MAMMAPLASTY Bilateral YRS AGO  . REPLACEMENT TOTAL KNEE Bilateral   . rotator cuff surgery     blilateral  . TONSILECTOMY/ADENOIDECTOMY WITH MYRINGOTOMY     Family History  Problem Relation Age of Onset  . Heart disease Mother   . Stroke Mother   . Hypertension Mother   . Heart disease Father        myocardial infarction age 93  . Breast cancer Neg Hx    Social History   Socioeconomic History  . Marital status: Married    Spouse name: Not on file  . Number of children: 2  . Years of education: Not on file  .  Highest education level: Not on file  Occupational History  . Not on file  Social Needs  . Financial resource strain: Not very hard  . Food insecurity:    Worry: Never true    Inability: Never true  . Transportation needs:    Medical: No    Non-medical: No  Tobacco Use  . Smoking status: Never Smoker  . Smokeless tobacco: Never Used  Substance and Sexual Activity  . Alcohol use: No    Alcohol/week: 0.0 oz  . Drug use: No  . Sexual activity: Not Currently  Lifestyle  . Physical activity:    Days per week: Not on file    Minutes per session: Not on file  . Stress: Not on file  Relationships  . Social connections:    Talks on phone: Patient refused    Gets together: Patient refused    Attends religious service: Patient refused    Active member of club or organization: Patient refused    Attends meetings of clubs or organizations: Patient refused    Relationship status:  Patient refused  Other Topics Concern  . Not on file  Social History Narrative  . Not on file    Outpatient Encounter Medications as of 02/10/2018  Medication Sig  . aluminum-magnesium hydroxide-simethicone (MAALOX) 200-200-20 MG/5ML SUSP Take 15 mLs by mouth 4 (four) times daily -  before meals and at bedtime.   Marland Kitchen amLODipine (NORVASC) 10 MG tablet TAKE 1 TABLET BY MOUTH DAILY  . atorvastatin (LIPITOR) 20 MG tablet TAKE 1 TABLET BY MOUTH EVERY NIGHT AT BEDTIME  . benazepril (LOTENSIN) 40 MG tablet TAKE 1 TABLET BY MOUTH DAILY  . Calcium-Magnesium-Vitamin D 600-40-500 MG-MG-UNIT TB24 Take 630 mg by mouth 4 (four) times daily.  [name] Sodium 30-100 MG CAPS Take 1 capsule by mouth daily.   . cephALEXin (KEFLEX) 250 MG capsule Take 250 mg by mouth daily.   . Cholecalciferol (VITAMIN D3) 1000 UNITS CAPS Take by mouth.  . DULoxetine (CYMBALTA) 60 MG capsule TAKE 1 CAPSULE BY MOUTH EVERY DAY.  . fluticasone (VERAMYST) 27.5 MCG/SPRAY nasal spray Place 1 spray into the nose daily.  Marland Kitchen gabapentin (NEURONTIN) 300 MG capsule Take 300 mg by mouth 3 (three) times daily.   . hydrochlorothiazide (HYDRODIURIL) 25 MG tablet TAKE 1 TABLET BY MOUTH EVERY DAY.  Marland Kitchen HYDROcodone-acetaminophen (NORCO) 10-325 MG per tablet Take 1 tablet by mouth 3 (three) times daily.   . Inulin (FIBER CHOICE PO) Take 2 tablets by mouth daily. Chew tablets  . magnesium oxide (MAG-OX) 400 MG tablet Take 1 tablet (400 mg total) by mouth daily.  . metoprolol succinate (TOPROL-XL) 100 MG 24 hr tablet TAKE 1 TABLET BY MOUTH TWICE A DAY WITH OR IMMEDIATELY FOLLOWING A MEAL  . morphine (MS CONTIN) 60 MG 12 hr tablet Take 60 mg by mouth 2 (two) times daily.   . mupirocin ointment (BACTROBAN) 2 % Apply to affected area on back bid  . Naldemedine Tosylate (SYMPROIC) 0.2 MG TABS Take 0.2 mg by mouth daily.  Marland Kitchen nystatin (NYSTATIN) powder Apply topically 2 (two) times daily.  Marland Kitchen nystatin cream (MYCOSTATIN) APPLY TO AFFECTED AREA  TWICE A DAY.  . pantoprazole (PROTONIX) 40 MG tablet TAKE 1 TABLET BY MOUTH TWICE A DAY.  Marland Kitchen polyethylene glycol powder (GLYCOLAX/MIRALAX) powder MIX 17 GRAMS AS MARKED IN BOTTLE TOP IN 8 OUNCES OF WATER MIX AND DRINK ONCE A DAY AS DIRECTED  . Probiotic Product (ALIGN PO) Take by mouth daily.  . ranitidine (ZANTAC)  150 MG tablet Take 150 mg by mouth 2 (two) times daily.  . sodium chloride (OCEAN) 0.65 % nasal spray Place 1 spray into the nose as needed.  Marland Kitchen SYNTHROID 112 MCG tablet TAKE 1 TABLET BY MOUTH DAILY. TAKE ON ANEMPTY STOMACH WITH A GLASS OFWATER 30-60 MINUTES BEFORE BREAKFAST  . triamcinolone cream (KENALOG) 0.1 %   . citalopram (CELEXA) 20 MG tablet Take 20 mg by mouth daily.   . [DISCONTINUED] FLUoxetine (PROZAC) 10 MG capsule Take 10 mg by mouth daily.  . [DISCONTINUED] morphine (MS CONTIN) 30 MG 12 hr tablet Take 30 mg by mouth daily. Takes around midday   No facility-administered encounter medications on file as of 02/10/2018.     Review of Systems  Constitutional: Negative for appetite change and unexpected weight change.  HENT: Negative for congestion and sinus pressure.   Respiratory: Negative for cough and chest tightness.        Breathing stable.   Cardiovascular: Negative for chest pain and palpitations.       Lower extremity swelling - doing well with compression hose.    Gastrointestinal: Negative for abdominal pain, diarrhea, nausea and vomiting.  Genitourinary: Negative for difficulty urinating and dysuria.  Musculoskeletal: Negative for joint swelling and myalgias.  Skin: Negative for color change and rash.  Neurological: Negative for dizziness, light-headedness and headaches.  Psychiatric/Behavioral: Negative for agitation and dysphoric mood.       Objective:     Blood pressure rechecked by me:  134/62  Physical Exam  Constitutional: She appears well-developed and well-nourished. No distress.  HENT:  Nose: Nose normal.  Mouth/Throat: Oropharynx is clear  and moist.  Neck: Neck supple. No thyromegaly present.  Cardiovascular: Normal rate and regular rhythm.  Pulmonary/Chest: Breath sounds normal. No respiratory distress. She has no wheezes.  Abdominal: Soft. Bowel sounds are normal. There is no tenderness.  Musculoskeletal: She exhibits no tenderness.  Stable.  Compression hose.    Lymphadenopathy:    She has no cervical adenopathy.  Skin: No rash noted. No erythema.  Psychiatric: She has a normal mood and affect. Her behavior is normal.    BP 134/62   Pulse (!) 59   Temp 97.6 F (36.4 C) (Oral)   Wt 185 lb 8 oz (84.1 kg)   SpO2 96%   BMI 33.93 kg/m  Wt Readings from Last 3 Encounters:  02/10/18 185 lb 8 oz (84.1 kg)  01/02/18 189 lb 12.8 oz (86.1 kg)  10/02/17 190 lb 12.8 oz (86.5 kg)     Lab Results  Component Value Date   WBC 7.7 09/24/2017   HGB 11.2 (L) 09/24/2017   HCT 33.6 (L) 09/24/2017   PLT 158 09/24/2017   GLUCOSE 95 09/24/2017   CHOL 116 07/18/2017   TRIG 91 07/18/2017   HDL 39 (L) 07/18/2017   LDLCALC 59 07/18/2017   ALT 11 (L) 09/24/2017   AST 20 09/24/2017   NA 136 09/24/2017   K 4.9 09/24/2017   CL 101 09/24/2017   CREATININE 0.97 09/24/2017   BUN 19 09/24/2017   CO2 32 09/24/2017   TSH 1.94 11/26/2016   INR 1.0 12/08/2013   HGBA1C 6.0 10/19/2014    Mm Screening Breast Tomo Bilateral  Result Date: 11/11/2017 CLINICAL DATA:  Screening. EXAM: DIGITAL SCREENING BILATERAL MAMMOGRAM WITH TOMO AND CAD COMPARISON:  Previous exam(s). ACR Breast Density Category b: There are scattered areas of fibroglandular density. FINDINGS: There are no findings suspicious for malignancy. Images were processed with CAD. IMPRESSION: No  mammographic evidence of malignancy. A result letter of this screening mammogram will be mailed directly to the patient. RECOMMENDATION: Screening mammogram in one year. (Code:SM-B-01Y) BI-RADS CATEGORY  1: Negative. Electronically Signed   By: Lajean Manes M.D.   On: 11/11/2017 15:23        Assessment & Plan:   Problem List Items Addressed This Visit    Anemia    Being followed by hematology.        Back pain    Chronic pain.  Followed by pain clinic and ortho.        BMI 33.0-33.9,adult    Discussed diet and exercise.        CHF (congestive heart failure) ([facility])    No evidence of volume overload.  Continue f/u with cardiology.        Chronic venous insufficiency    Continue compression hose.        GERD (gastroesophageal reflux disease)    Controlled on current regimen.  Follow.       Hyperlipidemia    On lipitor.  Low cholesterol diet and exercise.  Follow lipid panel and liver function tests.      Hypertension    Blood pressure on recheck improved.  Continue same medication regimen.  Follow pressures.  Follow metabolic panel.        Hypothyroidism    On thyroid replacement.  Follow tsh.       Neuropathy    Has seen neurology and ortho. On gabapentin        OSA (obstructive sleep apnea)    CPAP.       Pulmonary hypertension ([place])    Just evaluated by pulmonary.  Continue cpap.  Stable.       Pulmonary nodules    Just saw pulmonary.  Stable.  Recommended no further w/up - scan.       Stress    Increased stress as outlined.  Seeing Dr Nicolasa Ducking.  On cymbalta.  buspar added.  Stable.  Follow.            Einar Pheasant, MD

## 2018-02-12 ENCOUNTER — Encounter: Payer: PPO | Admitting: Physical Therapy

## 2018-02-13 DIAGNOSIS — M5416 Radiculopathy, lumbar region: Secondary | ICD-10-CM | POA: Diagnosis not present

## 2018-02-13 DIAGNOSIS — Z79899 Other long term (current) drug therapy: Secondary | ICD-10-CM | POA: Diagnosis not present

## 2018-02-13 DIAGNOSIS — K5903 Drug induced constipation: Secondary | ICD-10-CM | POA: Diagnosis not present

## 2018-02-13 DIAGNOSIS — S76312A Strain of muscle, fascia and tendon of the posterior muscle group at thigh level, left thigh, initial encounter: Secondary | ICD-10-CM | POA: Diagnosis not present

## 2018-02-13 DIAGNOSIS — M48061 Spinal stenosis, lumbar region without neurogenic claudication: Secondary | ICD-10-CM | POA: Diagnosis not present

## 2018-02-13 DIAGNOSIS — M519 Unspecified thoracic, thoracolumbar and lumbosacral intervertebral disc disorder: Secondary | ICD-10-CM | POA: Diagnosis not present

## 2018-02-13 DIAGNOSIS — Z79891 Long term (current) use of opiate analgesic: Secondary | ICD-10-CM | POA: Diagnosis not present

## 2018-02-13 DIAGNOSIS — Q675 Congenital deformity of spine: Secondary | ICD-10-CM | POA: Diagnosis not present

## 2018-02-17 ENCOUNTER — Encounter: Payer: PPO | Admitting: Physical Therapy

## 2018-02-18 ENCOUNTER — Other Ambulatory Visit: Payer: PPO

## 2018-02-18 NOTE — Assessment & Plan Note (Signed)
Continue compression hose.

## 2018-02-18 NOTE — Assessment & Plan Note (Signed)
Just saw pulmonary.  Stable.  Recommended no further w/up - scan.

## 2018-02-18 NOTE — Assessment & Plan Note (Signed)
Just evaluated by pulmonary.  Continue cpap.  Stable.

## 2018-02-18 NOTE — Assessment & Plan Note (Signed)
Has seen neurology and ortho. On gabapentin

## 2018-02-18 NOTE — Assessment & Plan Note (Signed)
CPAP.  

## 2018-02-18 NOTE — Assessment & Plan Note (Signed)
On lipitor.  Low cholesterol diet and exercise.  Follow lipid panel and liver function tests.   

## 2018-02-18 NOTE — Assessment & Plan Note (Signed)
Chronic pain.  Followed by pain clinic and ortho.

## 2018-02-18 NOTE — Assessment & Plan Note (Signed)
Controlled on current regimen.  Follow.

## 2018-02-18 NOTE — Assessment & Plan Note (Signed)
On thyroid replacement.  Follow tsh.

## 2018-02-18 NOTE — Assessment & Plan Note (Signed)
Being followed by hematology.

## 2018-02-18 NOTE — Assessment & Plan Note (Signed)
Blood pressure on recheck improved.  Continue same medication regimen.  Follow pressures.  Follow metabolic panel.

## 2018-02-18 NOTE — Assessment & Plan Note (Signed)
Discussed diet and exercise 

## 2018-02-18 NOTE — Assessment & Plan Note (Signed)
No evidence of volume overload.  Continue f/u with cardiology.

## 2018-02-18 NOTE — Assessment & Plan Note (Signed)
Increased stress as outlined.  Seeing Dr Nicolasa Ducking.  On cymbalta.  buspar added.  Stable.  Follow.

## 2018-02-19 ENCOUNTER — Encounter: Payer: PPO | Admitting: Physical Therapy

## 2018-02-24 ENCOUNTER — Ambulatory Visit: Payer: PPO | Admitting: Physical Therapy

## 2018-02-26 ENCOUNTER — Encounter: Payer: PPO | Admitting: Physical Therapy

## 2018-03-03 ENCOUNTER — Encounter: Payer: PPO | Admitting: Physical Therapy

## 2018-03-10 ENCOUNTER — Encounter: Payer: PPO | Admitting: Physical Therapy

## 2018-03-12 ENCOUNTER — Encounter: Payer: PPO | Admitting: Physical Therapy

## 2018-03-17 ENCOUNTER — Encounter: Payer: PPO | Admitting: Physical Therapy

## 2018-03-19 ENCOUNTER — Other Ambulatory Visit: Payer: PPO

## 2018-03-19 ENCOUNTER — Encounter: Payer: PPO | Admitting: Physical Therapy

## 2018-03-19 DIAGNOSIS — F419 Anxiety disorder, unspecified: Secondary | ICD-10-CM | POA: Diagnosis not present

## 2018-03-19 DIAGNOSIS — F330 Major depressive disorder, recurrent, mild: Secondary | ICD-10-CM | POA: Diagnosis not present

## 2018-03-19 DIAGNOSIS — F5105 Insomnia due to other mental disorder: Secondary | ICD-10-CM | POA: Diagnosis not present

## 2018-03-24 ENCOUNTER — Inpatient Hospital Stay: Payer: PPO

## 2018-03-24 ENCOUNTER — Encounter: Payer: PPO | Admitting: Physical Therapy

## 2018-03-26 ENCOUNTER — Encounter: Payer: PPO | Admitting: Physical Therapy

## 2018-03-27 ENCOUNTER — Ambulatory Visit: Payer: PPO | Admitting: Internal Medicine

## 2018-03-31 ENCOUNTER — Encounter: Payer: PPO | Admitting: Physical Therapy

## 2018-04-02 ENCOUNTER — Ambulatory Visit: Payer: PPO | Admitting: Physical Therapy

## 2018-04-04 ENCOUNTER — Other Ambulatory Visit: Payer: Self-pay | Admitting: Internal Medicine

## 2018-04-07 ENCOUNTER — Encounter: Payer: PPO | Admitting: Physical Therapy

## 2018-04-09 ENCOUNTER — Encounter: Payer: PPO | Admitting: Physical Therapy

## 2018-04-13 ENCOUNTER — Other Ambulatory Visit: Payer: Self-pay | Admitting: Gastroenterology

## 2018-04-13 DIAGNOSIS — R1084 Generalized abdominal pain: Secondary | ICD-10-CM

## 2018-04-13 DIAGNOSIS — R634 Abnormal weight loss: Secondary | ICD-10-CM | POA: Diagnosis not present

## 2018-04-16 ENCOUNTER — Telehealth: Payer: Self-pay | Admitting: *Deleted

## 2018-04-16 ENCOUNTER — Encounter: Payer: Self-pay | Admitting: Internal Medicine

## 2018-04-16 NOTE — Telephone Encounter (Signed)
Patient called and rescheduled her CT to 04/30/18. Her FOLLOW UP with Dr B is 04/22/18, Do you want to reschedule her follow up with you to after her CT? Please advise

## 2018-04-16 NOTE — Telephone Encounter (Signed)
You had informed me that you were going to get more information for letter.

## 2018-04-16 NOTE — Telephone Encounter (Signed)
Called patient to clarify what was needed. See me about this. It is regarding her son.

## 2018-04-17 ENCOUNTER — Ambulatory Visit
Admission: RE | Admit: 2018-04-17 | Discharge: 2018-04-17 | Disposition: A | Discharge status: Home / Self Care | Payer: PPO | Source: Ambulatory Visit | Attending: Gastroenterology | Admitting: Gastroenterology

## 2018-04-17 NOTE — Telephone Encounter (Signed)
Patient needs letter written for her son stating that it is medically necessary for him to have increased hours with [facility] in order to be able to complete ADLs due to change of condition at home. He is having increased anxiety with all of the changes that are going on at home.

## 2018-04-20 ENCOUNTER — Telehealth: Payer: Self-pay | Admitting: *Deleted

## 2018-04-20 ENCOUNTER — Inpatient Hospital Stay: Payer: PPO

## 2018-04-20 NOTE — Telephone Encounter (Signed)
-----  Message from Donnita Falls, RN sent at 04/20/2018  9:14 AM EDT ----- Patient called and wants to reschedule lab appt for today at 2 but also said she is having stomach pain, maybe she needs a call from nursing as well  Thanks,  tracie

## 2018-04-20 NOTE — Telephone Encounter (Signed)
I have marked to print letter, but I am not sure printed.  If you could print (if not printed) and see if [facility] needs it to say anything more.  I just kept the letter basic.  If ok, let me know or let me know if I need to add to the letter.  The letter is in her son's chart.  Thanks.

## 2018-04-20 NOTE — Telephone Encounter (Signed)
Patient instructed to call PCP regarding abdominal pain and she states she was going to, she just wanted to move her lab appointment to next Monday. Transferred to [employer]

## 2018-04-21 NOTE — Telephone Encounter (Signed)
I printed the letter and called Cindy at [facility]. I read the letter to her and she said that there was nothing else needed. Just need your signature so I can fax.

## 2018-04-22 ENCOUNTER — Ambulatory Visit: Payer: PPO | Admitting: Internal Medicine

## 2018-04-22 NOTE — Telephone Encounter (Signed)
Letter signed and place in your box.

## 2018-04-25 ENCOUNTER — Encounter: Payer: Self-pay | Admitting: Internal Medicine

## 2018-04-27 ENCOUNTER — Inpatient Hospital Stay: Payer: PPO | Attending: Internal Medicine

## 2018-04-27 ENCOUNTER — Other Ambulatory Visit (INDEPENDENT_AMBULATORY_CARE_PROVIDER_SITE_OTHER): Payer: PPO

## 2018-04-27 DIAGNOSIS — E039 Hypothyroidism, unspecified: Secondary | ICD-10-CM | POA: Diagnosis not present

## 2018-04-27 DIAGNOSIS — I10 Essential (primary) hypertension: Secondary | ICD-10-CM | POA: Diagnosis not present

## 2018-04-27 DIAGNOSIS — D500 Iron deficiency anemia secondary to blood loss (chronic): Secondary | ICD-10-CM | POA: Diagnosis not present

## 2018-04-27 DIAGNOSIS — E7800 Pure hypercholesterolemia, unspecified: Secondary | ICD-10-CM | POA: Diagnosis not present

## 2018-04-27 DIAGNOSIS — N3011 Interstitial cystitis (chronic) with hematuria: Secondary | ICD-10-CM | POA: Diagnosis not present

## 2018-04-27 LAB — COMPREHENSIVE METABOLIC PANEL
ALK PHOS: 67 U/L (ref 38–126)
ALT: 11 U/L (ref 0–44)
AST: 19 U/L (ref 15–41)
Albumin: 3.8 g/dL (ref 3.5–5.0)
Anion gap: 8 (ref 5–15)
BUN: 15 mg/dL (ref 8–23)
CALCIUM: 9.2 mg/dL (ref 8.9–10.3)
CO2: 28 mmol/L (ref 22–32)
CREATININE: 0.8 mg/dL (ref 0.44–1.00)
Chloride: 104 mmol/L (ref 98–111)
GFR calc Af Amer: >60 mL/min (ref >60)
GFR calc non Af Amer: >60 mL/min (ref >60)
GLUCOSE: 99 mg/dL (ref 70–99)
Potassium: 3.9 mmol/L (ref 3.5–5.1)
SODIUM: 140 mmol/L (ref 135–145)
Total Bilirubin: 0.5 mg/dL (ref 0.3–1.2)
Total Protein: 6.5 g/dL (ref 6.5–8.1)

## 2018-04-27 LAB — HEPATIC FUNCTION PANEL
ALT: 9 U/L (ref 0–35)
AST: 15 U/L (ref 0–37)
Albumin: 4.1 g/dL (ref 3.5–5.2)
Alkaline Phosphatase: 73 U/L (ref 39–117)
BILIRUBIN TOTAL: 0.5 mg/dL (ref 0.2–1.2)
Bilirubin, Direct: 0.1 mg/dL (ref 0.0–0.3)
Total Protein: 7.1 g/dL (ref 6.0–8.3)

## 2018-04-27 LAB — IRON AND TIBC
IRON: 64 ug/dL (ref 28–170)
Saturation Ratios: 24 % (ref 10.4–31.8)
TIBC: 264 ug/dL (ref 250–450)
UIBC: 200 ug/dL

## 2018-04-27 LAB — CBC WITH DIFFERENTIAL/PLATELET
Basophils Absolute: 0 10*3/uL (ref 0–0.1)
Basophils Relative: 0 %
EOS ABS: 0.1 10*3/uL (ref 0–0.7)
EOS PCT: 2 %
HCT: 31 % — ABNORMAL LOW (ref 35.0–47.0)
Hemoglobin: 10.4 g/dL — ABNORMAL LOW (ref 12.0–16.0)
Lymphocytes Relative: 26 %
Lymphs Abs: 1.7 10*3/uL (ref 1.0–3.6)
MCH: 33.4 pg (ref 26.0–34.0)
MCHC: 33.6 g/dL (ref 32.0–36.0)
MCV: 99.4 fL (ref 80.0–100.0)
MONO ABS: 0.6 10*3/uL (ref 0.2–0.9)
MONOS PCT: 9 %
Neutro Abs: 4.1 10*3/uL (ref 1.4–6.5)
Neutrophils Relative %: 63 %
PLATELETS: 152 10*3/uL (ref 150–440)
RBC: 3.11 MIL/uL — ABNORMAL LOW (ref 3.80–5.20)
RDW: 14 % (ref 11.5–14.5)
WBC: 6.6 10*3/uL (ref 3.6–11.0)

## 2018-04-27 LAB — BASIC METABOLIC PANEL
BUN: 15 mg/dL (ref 6–23)
CO2: 31 mEq/L (ref 19–32)
Calcium: 10 mg/dL (ref 8.4–10.5)
Chloride: 104 mEq/L (ref 96–112)
Creatinine, Ser: 0.84 mg/dL (ref 0.40–1.20)
GFR: 68.47 mL/min (ref >60.00)
Glucose, Bld: 100 mg/dL — ABNORMAL HIGH (ref 70–99)
POTASSIUM: 4.6 meq/L (ref 3.5–5.1)
SODIUM: 141 meq/L (ref 135–145)

## 2018-04-27 LAB — LIPID PANEL
Cholesterol: 126 mg/dL (ref 0–200)
HDL: 45.4 mg/dL (ref >39.00)
LDL Cholesterol: 62 mg/dL (ref 0–99)
NonHDL: 80.25
TRIGLYCERIDES: 89 mg/dL (ref 0.0–149.0)
Total CHOL/HDL Ratio: 3
VLDL: 17.8 mg/dL (ref 0.0–40.0)

## 2018-04-27 LAB — TSH: TSH: 0.34 u[IU]/mL — AB (ref 0.35–4.50)

## 2018-04-27 LAB — FERRITIN: Ferritin: 58 ng/mL (ref 11–307)

## 2018-04-27 NOTE — Telephone Encounter (Signed)
Please call. Per discharge summary, pt is in skilled nursing now.  The physician for the facility prescribes the medication for him while in skilled nursing.

## 2018-04-27 NOTE — Telephone Encounter (Signed)
LMTCB

## 2018-04-29 DIAGNOSIS — M19011 Primary osteoarthritis, right shoulder: Secondary | ICD-10-CM | POA: Diagnosis not present

## 2018-04-30 ENCOUNTER — Ambulatory Visit
Admission: RE | Admit: 2018-04-30 | Discharge: 2018-04-30 | Disposition: A | Discharge status: Home / Self Care | Payer: PPO | Source: Ambulatory Visit | Attending: Gastroenterology | Admitting: Gastroenterology

## 2018-04-30 DIAGNOSIS — I700 Atherosclerosis of aorta: Secondary | ICD-10-CM | POA: Diagnosis not present

## 2018-04-30 DIAGNOSIS — K429 Umbilical hernia without obstruction or gangrene: Secondary | ICD-10-CM | POA: Insufficient documentation

## 2018-04-30 DIAGNOSIS — R109 Unspecified abdominal pain: Secondary | ICD-10-CM | POA: Diagnosis not present

## 2018-04-30 DIAGNOSIS — R1084 Generalized abdominal pain: Secondary | ICD-10-CM | POA: Diagnosis not present

## 2018-04-30 DIAGNOSIS — D1779 Benign lipomatous neoplasm of other sites: Secondary | ICD-10-CM | POA: Insufficient documentation

## 2018-04-30 LAB — POCT I-STAT CREATININE: CREATININE: 0.7 mg/dL (ref 0.44–1.00)

## 2018-04-30 MED ORDER — IOPAMIDOL (ISOVUE-300) INJECTION 61%
100.0000 mL | Freq: Once | INTRAVENOUS | Status: AC | PRN
  Administered 2018-04-30: 100 mL via INTRAVENOUS

## 2018-05-01 ENCOUNTER — Inpatient Hospital Stay: Payer: PPO | Admitting: Internal Medicine

## 2018-05-01 NOTE — Telephone Encounter (Signed)
Patient is aware and stated she would talk to the doctor there. Advised to let us know if they needed anything else,.

## 2018-05-05 ENCOUNTER — Other Ambulatory Visit: Payer: Self-pay

## 2018-05-05 MED ORDER — LEVOTHYROXINE SODIUM 100 MCG PO TABS: 100.0000 ug | ORAL_TABLET | Freq: Every day | ORAL | 3 refills | Status: DC

## 2018-05-06 DIAGNOSIS — I5032 Chronic diastolic (congestive) heart failure: Secondary | ICD-10-CM | POA: Diagnosis not present

## 2018-05-06 DIAGNOSIS — I272 Pulmonary hypertension, unspecified: Secondary | ICD-10-CM | POA: Diagnosis not present

## 2018-05-06 DIAGNOSIS — I10 Essential (primary) hypertension: Secondary | ICD-10-CM | POA: Diagnosis not present

## 2018-05-06 DIAGNOSIS — G4733 Obstructive sleep apnea (adult) (pediatric): Secondary | ICD-10-CM | POA: Diagnosis not present

## 2018-05-06 DIAGNOSIS — I350 Nonrheumatic aortic (valve) stenosis: Secondary | ICD-10-CM | POA: Diagnosis not present

## 2018-05-14 ENCOUNTER — Telehealth: Payer: Self-pay | Admitting: *Deleted

## 2018-05-14 NOTE — Telephone Encounter (Signed)
Patient call requesting lab results from August. Left vm at 1640

## 2018-05-18 NOTE — Telephone Encounter (Signed)
Patient's call returned- lab results from 8/26 discussed with the patient.

## 2018-05-19 ENCOUNTER — Telehealth: Payer: Self-pay | Admitting: Internal Medicine

## 2018-05-19 DIAGNOSIS — D509 Iron deficiency anemia, unspecified: Secondary | ICD-10-CM

## 2018-05-19 NOTE — Telephone Encounter (Signed)
Please have patient follow-up with me in 6 months/labs CBC/iron studies ferritin. Thx

## 2018-05-20 NOTE — Addendum Note (Signed)
Addended by: Renita Papa R on: 05/20/2018 10:02 AM   Modules accepted: Orders

## 2018-05-20 NOTE — Telephone Encounter (Signed)
Colette, please schedule patient's followup

## 2018-05-21 DIAGNOSIS — K559 Vascular disorder of intestine, unspecified: Secondary | ICD-10-CM | POA: Diagnosis not present

## 2018-05-21 DIAGNOSIS — R1084 Generalized abdominal pain: Secondary | ICD-10-CM | POA: Diagnosis not present

## 2018-05-29 ENCOUNTER — Other Ambulatory Visit: Payer: Self-pay | Admitting: Internal Medicine

## 2018-06-04 ENCOUNTER — Other Ambulatory Visit: Payer: Self-pay | Admitting: Internal Medicine

## 2018-06-05 ENCOUNTER — Telehealth: Payer: Self-pay | Admitting: Internal Medicine

## 2018-06-05 ENCOUNTER — Ambulatory Visit (INDEPENDENT_AMBULATORY_CARE_PROVIDER_SITE_OTHER): Payer: PPO

## 2018-06-05 DIAGNOSIS — Z23 Encounter for immunization: Secondary | ICD-10-CM | POA: Diagnosis not present

## 2018-06-05 NOTE — Telephone Encounter (Signed)
Copied from [place] [id number]. Topic: General - Other >> Jun 05, 2018  5:06 PM Keene Breath wrote: Reason for CRM: Dawn with [facility] called to inform the doctor that the patient is requesting a bigger gram of her nystatin (NYSTATIN) powder.  Patient would like 60 grams.  Please advise.  CB# [phone]

## 2018-06-05 NOTE — Telephone Encounter (Signed)
Okay to refill? 

## 2018-06-05 NOTE — Telephone Encounter (Signed)
rx ok'd for nystatin powder.

## 2018-06-06 DIAGNOSIS — Q675 Congenital deformity of spine: Secondary | ICD-10-CM | POA: Diagnosis not present

## 2018-06-06 DIAGNOSIS — K5903 Drug induced constipation: Secondary | ICD-10-CM | POA: Diagnosis not present

## 2018-06-06 DIAGNOSIS — M48061 Spinal stenosis, lumbar region without neurogenic claudication: Secondary | ICD-10-CM | POA: Diagnosis not present

## 2018-06-06 DIAGNOSIS — M5416 Radiculopathy, lumbar region: Secondary | ICD-10-CM | POA: Diagnosis not present

## 2018-06-06 DIAGNOSIS — M519 Unspecified thoracic, thoracolumbar and lumbosacral intervertebral disc disorder: Secondary | ICD-10-CM | POA: Diagnosis not present

## 2018-06-06 DIAGNOSIS — Z79899 Other long term (current) drug therapy: Secondary | ICD-10-CM | POA: Diagnosis not present

## 2018-06-06 DIAGNOSIS — S76312A Strain of muscle, fascia and tendon of the posterior muscle group at thigh level, left thigh, initial encounter: Secondary | ICD-10-CM | POA: Diagnosis not present

## 2018-06-08 NOTE — Telephone Encounter (Signed)
Ok for 60 gram.

## 2018-06-08 NOTE — Telephone Encounter (Signed)
OK to fill Nystatin Powder at 60 G size?

## 2018-06-08 NOTE — Telephone Encounter (Signed)
rx request

## 2018-06-09 DIAGNOSIS — I350 Nonrheumatic aortic (valve) stenosis: Secondary | ICD-10-CM | POA: Diagnosis not present

## 2018-06-09 DIAGNOSIS — I5032 Chronic diastolic (congestive) heart failure: Secondary | ICD-10-CM | POA: Diagnosis not present

## 2018-06-09 DIAGNOSIS — I10 Essential (primary) hypertension: Secondary | ICD-10-CM | POA: Diagnosis not present

## 2018-06-09 DIAGNOSIS — I272 Pulmonary hypertension, unspecified: Secondary | ICD-10-CM | POA: Diagnosis not present

## 2018-06-10 MED ORDER — NYSTATIN 100000 UNIT/GM EX CREA: TOPICAL_CREAM | CUTANEOUS | 1 refills | Status: DC

## 2018-06-10 NOTE — Telephone Encounter (Signed)
Order sent as verbal approval given

## 2018-06-12 ENCOUNTER — Encounter (INDEPENDENT_AMBULATORY_CARE_PROVIDER_SITE_OTHER): Payer: Self-pay | Admitting: Vascular Surgery

## 2018-06-12 ENCOUNTER — Ambulatory Visit (INDEPENDENT_AMBULATORY_CARE_PROVIDER_SITE_OTHER): Payer: PPO | Admitting: Vascular Surgery

## 2018-06-12 VITALS — BP 151/62 | HR 66 | Resp 19 | Ht 66.0 in | Wt 186.0 lb

## 2018-06-12 DIAGNOSIS — I890 Lymphedema, not elsewhere classified: Secondary | ICD-10-CM

## 2018-06-12 DIAGNOSIS — R600 Localized edema: Secondary | ICD-10-CM

## 2018-06-12 DIAGNOSIS — E785 Hyperlipidemia, unspecified: Secondary | ICD-10-CM

## 2018-06-12 DIAGNOSIS — R1084 Generalized abdominal pain: Secondary | ICD-10-CM | POA: Diagnosis not present

## 2018-06-12 DIAGNOSIS — I10 Essential (primary) hypertension: Secondary | ICD-10-CM

## 2018-06-12 DIAGNOSIS — M7989 Other specified soft tissue disorders: Secondary | ICD-10-CM

## 2018-06-12 DIAGNOSIS — I509 Heart failure, unspecified: Secondary | ICD-10-CM

## 2018-06-12 NOTE — Assessment & Plan Note (Signed)
The patient has postprandial abdominal pain and weight loss which is concerning for chronic mesenteric ischemia.  Her CT is really not helpful in evaluating for this.  I have recommended a mesenteric duplex to be performed at her convenience in the near future for further evaluation.  I discussed the pathophysiology and natural history of chronic mesenteric ischemia.  We will see her back following the study

## 2018-06-12 NOTE — Progress Notes (Signed)
MRN : [id number]  Kimberly Roy is a 84 y.o. ([birth year]) female who presents with chief complaint of  Chief Complaint  Patient presents with  . New Patient (Initial Visit)    Abdominal pain  .  History of Present Illness: Patient returns today in follow up on request from her gastroenterology providers.  She she has been followed by [place] for some time for her lymphedema.  Her legs are actually doing quite well but now she is having postprandial abdominal pain, unintentional weight loss, and a decreased appetite.  This has been steadily progressing for several months.  She is apparently lost almost 10 pounds since April.  This is not intentional.  The pain is severe, at times 10 out of 10.  It is midabdominal.  No fever or chills.  She underwent a CT scan of the abdomen and pelvis which I have reviewed.  This is a 5 mm cut standard CT scans of the really is not much inference that can may be made about her visceral flow.  She does have some aortic atherosclerosis present.  Current Outpatient Medications  Medication Sig Dispense Refill  . aluminum-magnesium hydroxide-simethicone (MAALOX) 409-811-91 MG/5ML SUSP Take 15 mLs by mouth 4 (four) times daily -  before meals and at bedtime.     Marland Kitchen amLODipine (NORVASC) 10 MG tablet TAKE 1 TABLET BY MOUTH DAILY 90 tablet 1  . atorvastatin (LIPITOR) 20 MG tablet TAKE 1 TABLET BY MOUTH EVERY NIGHT AT BEDTIME 90 tablet 1  . benazepril (LOTENSIN) 40 MG tablet TAKE 1 TABLET BY MOUTH DAILY 90 tablet 1  . busPIRone (BUSPAR) 10 MG tablet     . Calcium Citrate-Vitamin D 315-250 MG-UNIT TABS Take by mouth.    . Calcium-Magnesium-Vitamin D 600-40-500 MG-MG-UNIT TB24 Take 630 mg by mouth 4 (four) times daily.    [name] Sodium 30-100 MG CAPS Take 1 capsule by mouth daily.     . cephALEXin (KEFLEX) 250 MG capsule Take 250 mg by mouth daily.     . Cholecalciferol (VITAMIN D3) 1000 UNITS CAPS Take by mouth.    . citalopram (CELEXA) 20 MG tablet Take 20 mg  by mouth daily.     . diclofenac sodium (VOLTAREN) 1 % GEL Apply topically.    . DULoxetine (CYMBALTA) 60 MG capsule TAKE 1 CAPSULE BY MOUTH EVERY DAY. 30 capsule 3  . FLUoxetine (PROZAC) 20 MG tablet     . fluticasone (FLONASE) 50 MCG/ACT nasal spray     . fluticasone (VERAMYST) 27.5 MCG/SPRAY nasal spray Place 1 spray into the nose daily. 10 g 0  . gabapentin (NEURONTIN) 300 MG capsule Take 300 mg by mouth 3 (three) times daily.     . hydrochlorothiazide (HYDRODIURIL) 25 MG tablet TAKE 1 TABLET BY MOUTH EVERY DAY. 90 tablet 1  . HYDROcodone-acetaminophen (NORCO) 10-325 MG per tablet Take 1 tablet by mouth 3 (three) times daily.     . Inulin (FIBER CHOICE PO) Take 2 tablets by mouth daily. Chew tablets    . levothyroxine (SYNTHROID, LEVOTHROID) 100 MCG tablet Take 1 tablet (100 mcg total) by mouth daily. 90 tablet 3  . magnesium oxide (MAG-OX) 400 MG tablet Take 1 tablet (400 mg total) by mouth daily. 30 tablet 1  . metoprolol succinate (TOPROL-XL) 100 MG 24 hr tablet TAKE 1 TABLET BY MOUTH TWICE A DAY WITH OR IMMEDIATELY FOLLOWING A MEAL 180 tablet 2  . morphine (MS CONTIN) 60 MG 12 hr tablet Take 60 mg by mouth  2 (two) times daily.     . mupirocin ointment (BACTROBAN) 2 % Apply to affected area on back bid 22 g 0  . Naldemedine Tosylate (SYMPROIC) 0.2 MG TABS Take 0.2 mg by mouth daily.    Marland Kitchen NYAMYC powder APPLY TOPICALLY 2 TIMES DAILY 30 g 0  . nystatin cream (MYCOSTATIN) APPLY TO AFFECTED AREA TWICE A DAY. 60 g 1  . pantoprazole (PROTONIX) 40 MG tablet TAKE 1 TABLET BY MOUTH TWICE A DAY. 60 tablet 3  . polyethylene glycol powder (GLYCOLAX/MIRALAX) powder MIX 17 GRAMS AS MARKED ON BOTTLE TOP IN 8 OUNCES OF WATER AND DRINK ONCE A DAY AS DIRECTED. 527 g 3  . Probiotic Product (ALIGN PO) Take by mouth daily.    . ranitidine (ZANTAC) 150 MG tablet Take 150 mg by mouth 2 (two) times daily.    . sodium chloride (OCEAN) 0.65 % nasal spray Place 1 spray into the nose as needed.    . triamcinolone  cream (KENALOG) 0.1 %     . Vitamins/Minerals TABS Take by mouth.     No current facility-administered medications for this visit.     Past Medical History:  Diagnosis Date  . Anemia   . Anxiety   . Chest pain   . CHF (congestive heart failure) ([place])   . Constipation   . DDD (degenerative disc disease), cervical   . Depression   . DVT (deep venous thrombosis) ([place])   . Dysphonia   . Dyspnea   . Fatty liver   . Fatty liver   . Headache   . Hyperlipidemia   . Hyperpiesia   . Hypertension   . Hypothyroidism   . Interstitial cystitis   . Left ventricular dysfunction   . Lymphedema   . Nephrolithiasis   . Obstructive sleep apnea   . Osteoarthritis    knees/cervical and lumbar spine  . Pulmonary hypertension ([place])   . Pulmonary nodules    followed by Dr Raul Del  . Pure hypercholesterolemia   . Renal cyst    right    Past Surgical History:  Procedure Laterality Date  . ABDOMINAL HYSTERECTOMY     ovaries left in place  . APPENDECTOMY    . Back Surgeries    . BREAST REDUCTION SURGERY     3/99  . CARDIAC CATHETERIZATION    . cataracts Bilateral   . CERVICAL SPINE SURGERY    . ESOPHAGEAL MANOMETRY N/A 08/02/2015   Procedure: ESOPHAGEAL MANOMETRY (EM);  Surgeon: Josefine Class, MD;  [facility] ENDOSCOPY;  Service: Endoscopy;  Laterality: N/A;  . ESOPHAGOGASTRODUODENOSCOPY N/A 02/27/2015   Procedure: ESOPHAGOGASTRODUODENOSCOPY (EGD);  Surgeon: Hulen Luster, MD;  Location[facility] ENDOSCOPY;  Service: Gastroenterology;  Laterality: N/A;  . EXCISIONAL HEMORRHOIDECTOMY    . HEMORRHOID SURGERY    . HIP SURGERY  2013   Right hip surgery  . KNEE ARTHROSCOPY     left and right  . REDUCTION MAMMAPLASTY Bilateral YRS AGO  . REPLACEMENT TOTAL KNEE Bilateral   . rotator cuff surgery     blilateral  . TONSILECTOMY/ADENOIDECTOMY WITH MYRINGOTOMY      Social History Social History   Tobacco Use  . Smoking status: Never Smoker  . Smokeless tobacco: Never Used    Substance Use Topics  . Alcohol use: No    Alcohol/week: 0.0 standard drinks  . Drug use: No    Family History Family History  Problem Relation Age of Onset  . Heart disease Mother   . Stroke Mother   .  Hypertension Mother   . Heart disease Father        myocardial infarction age 42  . Breast cancer Neg Hx      Allergies  Allergen Reactions  . Lyrica [Pregabalin] Swelling  . Atarax [Hydroxyzine]     jittery  . Dicyclomine Other (See Comments)    Abdominal bloating   . Hydroxyzine Hcl     jittery  . Levaquin [Levofloxacin] Swelling  . Nucynta Er [Tapentadol Hcl Er] Other (See Comments)    Severe constipation   . Oxybutynin Other (See Comments)    Blurred vision  . Zoloft [Sertraline Hcl]     Severe headache  . Biaxin [Clarithromycin] Other (See Comments) and Rash    Pt does not remember Pt does not remember  . Sertraline Nausea And Vomiting    Severe headache Severe headache Other reaction(s): Headache Severe headache  . Sulfa Antibiotics Rash    Pt does not remember  . Sulfasalazine Rash    Pt does not remember  . Tape Rash    [place] - redness  . Tapentadol Other (See Comments) and Rash    _0      REVIEW OF SYSTEMS (Negative unless checked)  Constitutional: _1 Weight loss  _2 Fever  _3 Chills Cardiac: _4 Chest pain   _5 Chest pressure   _6 Palpitations   _7 Shortness of breath when laying flat   _8 Shortness of breath at rest   _9 Shortness of breath with exertion. Vascular:  _10 Pain in legs with walking   _11 Pain in legs at rest   _12 Pain in legs when laying flat   _13 Claudication   _14 Pain in feet when walking  _15 Pain in feet at rest  _16 Pain in feet when laying flat   _17 History of DVT   _18 Phlebitis   _19 Swelling in legs   _20 Varicose veins   _21 Non-healing ulcers Pulmonary:   _22 Uses home oxygen   _23 Productive cough   _24 Hemoptysis   _25 Wheeze  _26 COPD    _27 Asthma Neurologic:  _28 Dizziness  _29 Blackouts   _30 Seizures   _31 History of stroke   _32 History of TIA  _33 Aphasia   _34 Temporary blindness   _35 Dysphagia   _36 Weakness or numbness in arms   _37 Weakness or numbness in legs Musculoskeletal:  _38 Arthritis   _39 Joint swelling   _40 Joint pain   _41 Low back pain Hematologic:  _42 Easy bruising  _43 Easy bleeding   _44 Hypercoagulable state   _45 Anemic   Gastrointestinal:  _46 Blood in stool   _47 Vomiting blood  _48 Gastroesophageal reflux/heartburn   _49 Abdominal pain Genitourinary:  _50 Chronic kidney disease   _51 Difficult urination  _52 Frequent urination  _53 Burning with urination   _54 Hematuria Skin:  _55 Rashes   _56 Ulcers   _57 Wounds Psychological:  _58 History of anxiety   _59  History of major depression.  Physical Examination  BP (!) 151/62 (BP Location: Right Arm, Patient Position: Sitting)   Pulse 66   Resp 19   Ht _60  (1.676 m)   Wt 186 lb (84.4 kg)   BMI 30.02 kg/m  Gen:  WD/WN, NAD Head: [place]/AT, No temporalis wasting. Ear/Nose/Throat: Hearing grossly intact, nares w/o erythema or drainage Eyes: Conjunctiva clear. Sclera non-icteric Neck: Supple.  Trachea midline Pulmonary:  Good air movement, no use of accessory muscles.  Cardiac: RRR, no JVD Vascular:  Vessel Right Left  Radial Palpable Palpable                                   Gastrointestinal:  soft, non-tender/non-distended. No guarding/reflex.  Musculoskeletal: M/S 5/5 throughout.  Uses a walker.  No deformity or atrophy.  Mild bilateral lower extremity edema. Neurologic: Sensation grossly intact in extremities.  Symmetrical.  Speech is fluent.  Psychiatric: Judgment intact, Mood & affect appropriate for pt's clinical situation. Dermatologic: No rashes or ulcers noted.  No cellulitis or open wounds.       Labs Recent Results (from the past 2160 hour(s))  Ferritin     Status: None   Collection Time: 04/27/18 10:33 AM  Result Value Ref Range   Ferritin 58 11 - 307 ng/mL    Comment:  Performed at [facility], [address] [place], [place] [address]  Iron and TIBC     Status: None   Collection Time: 04/27/18 10:33 AM  Result Value Ref Range   Iron 64 28 - 170 ug/dL   TIBC 264 250 - 450 ug/dL   Saturation Ratios 24 10.4 - 31.8 %   UIBC 200 ug/dL    Comment: Performed at [facility], [address] [place], [place] [address]  Comprehensive metabolic panel     Status: None   Collection Time: 04/27/18 10:33 AM  Result Value Ref Range   Sodium 140 135 - 145 mmol/L   Potassium 3.9 3.5 - 5.1 mmol/L   Chloride 104 98 - 111 mmol/L   CO2 28 22 - 32 mmol/L   Glucose, Bld 99 70 - 99 mg/dL   BUN 15 8 - 23 mg/dL   Creatinine, Ser 0.80 0.44 - 1.00 mg/dL   Calcium 9.2 8.9 - 10.3 mg/dL   Total Protein 6.5 6.5 - 8.1 g/dL   Albumin 3.8 3.5 - 5.0 g/dL   AST 19 15 - 41 U/L   ALT 11 0 - 44 U/L   Alkaline Phosphatase 67 38 - 126 U/L   Total Bilirubin 0.5 0.3 - 1.2 mg/dL   GFR calc non Af Amer >60 >60 mL/min   GFR calc Af Amer >60 >60 mL/min    Comment: (NOTE) The eGFR has been calculated using the CKD EPI equation. This calculation has not been validated in all clinical situations. eGFR's persistently <60 mL/min signify possible Chronic Kidney Disease.    Anion gap 8 5 - 15    Comment: Performed at [facility], [address] [place], [place] [address]  CBC with Differential     Status: Abnormal   Collection Time: 04/27/18 10:33 AM  Result Value Ref Range   WBC 6.6 3.6 - 11.0 K/uL   RBC 3.11 (L) 3.80 - 5.20 MIL/uL   Hemoglobin 10.4 (L) 12.0 - 16.0 g/dL   HCT 31.0 (L) 35.0 - 47.0 %   MCV 99.4 80.0 - 100.0 fL   MCH 33.4 26.0 - 34.0 pg   MCHC 33.6 32.0 - 36.0 g/dL   RDW 14.0 11.5 - 14.5 %   Platelets 152 150 - 440 K/uL   Neutrophils Relative % 63 %   Neutro Abs 4.1 1.4 - 6.5 K/uL   Lymphocytes Relative 26 %   Lymphs Abs 1.7 1.0 - 3.6 K/uL   Monocytes Relative 9 %   Monocytes Absolute 0.6 0.2 - 0.9 K/uL   Eosinophils Relative 2 %    Eosinophils Absolute 0.1 0 - 0.7 K/uL   Basophils Relative 0 %   Basophils Absolute 0.0 0 - 0.1 K/uL    Comment: Performed at [facility], [address], [place], [place] [address]  Basic metabolic panel  Status: Abnormal   Collection Time: 04/27/18 11:00 AM  Result Value Ref Range   Sodium 141 135 - 145 mEq/L   Potassium 4.6 3.5 - 5.1 mEq/L   Chloride 104 96 - 112 mEq/L   CO2 31 19 - 32 mEq/L   Glucose, Bld 100 (H) 70 - 99 mg/dL   BUN 15 6 - 23 mg/dL   Creatinine, Ser 0.84 0.40 - 1.20 mg/dL   Calcium 10.0 8.4 - 10.5 mg/dL   GFR 68.47 >60.00 mL/min  TSH     Status: Abnormal   Collection Time: 04/27/18 11:00 AM  Result Value Ref Range   TSH 0.34 (L) 0.35 - 4.50 uIU/mL  Lipid panel     Status: None   Collection Time: 04/27/18 11:00 AM  Result Value Ref Range   Cholesterol 126 0 - 200 mg/dL    Comment: ATP III Classification       Desirable:  < 200 mg/dL               Borderline High:  200 - 239 mg/dL          High:  > = 240 mg/dL   Triglycerides 89.0 0.0 - 149.0 mg/dL    Comment: Normal:  <150 mg/dLBorderline High:  150 - 199 mg/dL   HDL 45.40 >39.00 mg/dL   VLDL 17.8 0.0 - 40.0 mg/dL   LDL Cholesterol 62 0 - 99 mg/dL   Total CHOL/HDL Ratio 3     Comment:                Men          Women1/2 Average Risk     3.4          3.3Average Risk          5.0          4.42X Average Risk          9.6          7.13X Average Risk          15.0          11.0                       NonHDL 80.25     Comment: NOTE:  Non-HDL goal should be 30 mg/dL higher than patient's LDL goal (i.e. LDL goal of < 70 mg/dL, would have non-HDL goal of < 100 mg/dL)  Hepatic function panel     Status: None   Collection Time: 04/27/18 11:00 AM  Result Value Ref Range   Total Bilirubin 0.5 0.2 - 1.2 mg/dL   Bilirubin, Direct 0.1 0.0 - 0.3 mg/dL   Alkaline Phosphatase 73 39 - 117 U/L   AST 15 0 - 37 U/L   ALT 9 0 - 35 U/L   Total Protein 7.1 6.0 - 8.3 g/dL   Albumin 4.1 3.5 - 5.2 g/dL  I-STAT creatinine      Status: None   Collection Time: 04/30/18  3:18 PM  Result Value Ref Range   Creatinine, Ser 0.70 0.44 - 1.00 mg/dL    Radiology No results found.  Assessment/Plan CHF (congestive heart failure) Can contribute to lower extremity swelling.  Hypercholesterolemia lipid control important in reducing the progression of atherosclerotic disease. Continue statin therapy   Hypertension blood pressure control important in reducing the progression of atherosclerotic disease. On appropriate oral medications.   Lymphedema Compression and elevation have done a great job of controlling her  swelling.  This remains reasonably mild.  At this point, she can use the lymphedema pump as needed.    Abdominal pain The patient has postprandial abdominal pain and weight loss which is concerning for chronic mesenteric ischemia.  Her CT is really not helpful in evaluating for this.  I have recommended a mesenteric duplex to be performed at her convenience in the near future for further evaluation.  I discussed the pathophysiology and natural history of chronic mesenteric ischemia.  We will see her back following the study    Leotis Pain, MD  06/12/2018 3:33 PM    This note was created with Dragon medical transcription system.  Any errors from dictation are purely unintentional

## 2018-06-12 NOTE — Patient Instructions (Signed)
Chronic Mesenteric Ischemia Mesenteric ischemia is poor blood flow (circulation) in the vessels that supply blood to the stomach, intestines, and liver (mesenteric organs). Chronic mesenteric ischemia, also called mesenteric angina or intestinal angina, is a long-term (chronic) condition. It happens when an artery or vein that provides blood to the mesenteric organs gradually becomes blocked or narrow, restricting the blood supply to the organs. When the blood supply is severely restricted, the mesenteric organs cannot work properly. What are the causes? This condition is commonly caused by fatty deposits that build up in an artery (plaque), which can narrow the artery and restrict blood flow. Other causes include:  Weakened areas in blood vessel walls (aneurysms).  Conditions that cause twisting or inflammation of blood vessels, such as fibromuscular dysplasia or arteritis.  A disorder in which blood clots form in the veins (venous thrombosis).  Scarring and thickening (fibrosis) of blood vessels caused by radiation therapy.  A tear in the aorta, the body's main artery (aortic dissection).  Blood vessel problems after illegal drug use, such as use of cocaine.  Tumors in the nervous system (neurofibromatosis).  Certain autoimmune diseases, such as lupus.  What increases the risk? The following factors may make you more likely to develop this condition:  Being female.  Being over age 41, especially if you have a history of heart problems.  Smoking.  Congestive heart failure.  Irregular heartbeat (arrhythmia).  Having a history of heart attack or stroke.  Diabetes.  High cholesterol.  High blood pressure (hypertension).  Being overweight or obese.  Kidney disease (renal disease) requiring dialysis.  What are the signs or symptoms? Symptoms of this condition include:  Abdomen (abdominal) pain or cramps that develop 15-60 minutes after a meal. This pain may last for 1-3  hours. Some people may develop a fear of eating because of this symptom.  Weight loss.  Diarrhea.  Bloody stool.  Nausea.  Vomiting.  Bloating.  Abdominal pain after stress or with exercise.  How is this diagnosed? This condition is diagnosed based on:  Your medical history.  A physical exam.  Tests, such as: ? Ultrasound. ? CT scan. ? Blood tests. ? Urine tests. ? An imaging test that involves injecting a dye into your arteries to show blood flow through blood vessels (angiogram). This can help to show if there are any blockages in the vessels that lead to the intestines. ? Passing a small probe through the mouth and into the stomach to measure the output of carbon dioxide (gastric tonometry). This can help to indicate whether there is decreased blood flow to the stomach and intestines.  How is this treated? This condition may be treated with:  Dietary changes such as eating smaller, low-fat, meals more frequently.  Lifestyle changes to treat underlying conditions that contribute to the disease, such as high cholesterol and high blood pressure.  Medicines to reduce blood clotting and increase blood flow.  Surgery to remove the blockage, repair arteries or veins, and restore blood flow. This may involve: ? Angioplasty. This is surgery to widen the affected artery, reduce the blockage, and sometimes insert a small, mesh tube (stent). ? Bypass surgery. This may be done to go around (bypass) the blockage and reconnect healthy arteries or veins. ? Placing a stent in the affected area. This may be done to help keep blocked arteries open.  Follow these instructions at home: Eating and drinking  Eat a heart-healthy diet. This includes fresh fruits and vegetables, whole grains, and lean proteins  like chicken, fish, eggs, and beans.  Avoid foods that contain a lot of: ? Salt (sodium). ? Sugar. ? Saturated fat (such as red meat). ? Trans fat (such as fried foods).  Stay  hydrated. Drink enough fluid to keep your urine clear or pale yellow. Lifestyle  Stay active and get regular exercise as told by your health care provider. Aim for 150 minutes of moderate activity or 75 minutes of vigorous activity a week. Ask your health care provider what activities and forms of exercise are safe for you.  Maintain a healthy weight.  Work with your health care provider to manage your cholesterol.  Manage any other health problems you have, such as high blood pressure, diabetes, or heart rhythm problems.  Do not use any products that contain nicotine or tobacco, such as cigarettes and e-cigarettes. If you need help quitting, ask your health care provider. General instructions  Take over-the-counter and prescription medicines only as told by your health care provider.  Keep all follow-up visits as told by your health care provider. This is important. Contact a health care provider if:  Your symptoms do not improve or they return after treatment.  You have a fever. Get help right away if:  You have severe abdominal pain.  You have severe chest pain.  You have shortness of breath.  You feel weak or dizzy.  You have palpitations.  You have numbness or weakness in your face, arm, or leg.  You are confused.  You have trouble speaking or people have trouble understanding what you are saying.  You are constipated.  You have trouble urinating.  You have blood in your stool.  You have severe nausea, vomiting, or persistent diarrhea. Summary  Mesenteric ischemia is poor circulation in the vessels that supply blood to the the stomach, intestines, and liver (mesenteric organs).  This condition happens when an artery or vein that provides blood to the mesenteric organs gradually becomes blocked or narrow, restricting the blood supply to the organs.  This condition is commonly caused by fatty deposits that build up in an artery (plaque), which can narrow the  artery and restrict blood flow.  You are more likely to develop this condition if you are over age 47 and have a history of heart problems, high blood pressure, diabetes, or high cholesterol.  This condition is usually treated with medicines, dietary and lifestyle changes, and surgery to remove the blockage, repair arteries or veins, and restore blood flow. This information is not intended to replace advice given to you by your health care provider. Make sure you discuss any questions you have with your health care provider. Document Released: 04/08/2011 Document Revised: 08/03/2016 Document Reviewed: 08/03/2016 Elsevier Interactive Patient Education  2017 [employer].

## 2018-06-15 ENCOUNTER — Telehealth: Payer: Self-pay | Admitting: Radiology

## 2018-06-15 NOTE — Telephone Encounter (Signed)
Pt coming in tomorrow for labs, please place future orders. Thank you.

## 2018-06-16 ENCOUNTER — Other Ambulatory Visit (INDEPENDENT_AMBULATORY_CARE_PROVIDER_SITE_OTHER): Payer: PPO

## 2018-06-16 ENCOUNTER — Other Ambulatory Visit: Payer: Self-pay | Admitting: Internal Medicine

## 2018-06-16 DIAGNOSIS — E039 Hypothyroidism, unspecified: Secondary | ICD-10-CM

## 2018-06-16 LAB — TSH: TSH: 0.45 u[IU]/mL (ref 0.35–4.50)

## 2018-06-16 NOTE — Telephone Encounter (Signed)
Order placed for f/u tsh.  

## 2018-06-16 NOTE — Progress Notes (Signed)
Order placed for f/u tsh.

## 2018-06-17 ENCOUNTER — Encounter: Payer: Self-pay | Admitting: Internal Medicine

## 2018-06-19 ENCOUNTER — Encounter: Payer: Self-pay | Admitting: Internal Medicine

## 2018-06-19 ENCOUNTER — Ambulatory Visit (INDEPENDENT_AMBULATORY_CARE_PROVIDER_SITE_OTHER): Payer: PPO | Admitting: Internal Medicine

## 2018-06-19 DIAGNOSIS — I890 Lymphedema, not elsewhere classified: Secondary | ICD-10-CM | POA: Diagnosis not present

## 2018-06-19 DIAGNOSIS — R1084 Generalized abdominal pain: Secondary | ICD-10-CM

## 2018-06-19 DIAGNOSIS — I10 Essential (primary) hypertension: Secondary | ICD-10-CM | POA: Diagnosis not present

## 2018-06-19 DIAGNOSIS — D649 Anemia, unspecified: Secondary | ICD-10-CM

## 2018-06-19 DIAGNOSIS — K219 Gastro-esophageal reflux disease without esophagitis: Secondary | ICD-10-CM

## 2018-06-19 DIAGNOSIS — G4733 Obstructive sleep apnea (adult) (pediatric): Secondary | ICD-10-CM | POA: Diagnosis not present

## 2018-06-19 DIAGNOSIS — I872 Venous insufficiency (chronic) (peripheral): Secondary | ICD-10-CM | POA: Diagnosis not present

## 2018-06-19 DIAGNOSIS — E785 Hyperlipidemia, unspecified: Secondary | ICD-10-CM | POA: Diagnosis not present

## 2018-06-19 DIAGNOSIS — E039 Hypothyroidism, unspecified: Secondary | ICD-10-CM | POA: Diagnosis not present

## 2018-06-19 DIAGNOSIS — I272 Pulmonary hypertension, unspecified: Secondary | ICD-10-CM | POA: Diagnosis not present

## 2018-06-19 DIAGNOSIS — F439 Reaction to severe stress, unspecified: Secondary | ICD-10-CM

## 2018-06-19 DIAGNOSIS — I509 Heart failure, unspecified: Secondary | ICD-10-CM

## 2018-06-19 NOTE — Progress Notes (Signed)
Patient ID: Kimberly Roy, female   DOB: 1931-04-24, 84 y.o.   MRN: [id number]   Subjective:    Patient ID: Kimberly Roy, female    DOB: May 11, 1931, 84 y.o.   MRN: [id number]  HPI  Patient here for her physical exam.  She was evaluated by vascular surgery 06/12/18 for postprandial abdominal pain and decreased appetite.  Also some weight loss.  Had CT scan.  Reviewed.  Concern over chronic mesenteric ischemia.   Planning for vascular procedure to further evaluate.  Also saw cardiology for f/u chronic diastolic CHF and hypertension.  Recommended f/u echo and f/u in 6 months.  No chest pain.  Breathing stable.  Bowels moving.  Seeing Vance Peper for right shoulder.  Handling stress.  Does not feel needs any further intervention.     Past Medical History:  Diagnosis Date  . Anemia   . Anxiety   . Chest pain   . CHF (congestive heart failure) ([place])   . Constipation   . DDD (degenerative disc disease), cervical   . Depression   . DVT (deep venous thrombosis) ([place])   . Dysphonia   . Dyspnea   . Fatty liver   . Fatty liver   . Headache   . Hyperlipidemia   . Hyperpiesia   . Hypertension   . Hypothyroidism   . Interstitial cystitis   . Left ventricular dysfunction   . Lymphedema   . Nephrolithiasis   . Obstructive sleep apnea   . Osteoarthritis    knees/cervical and lumbar spine  . Pulmonary hypertension ([place])   . Pulmonary nodules    followed by Dr Raul Del  . Pure hypercholesterolemia   . Renal cyst    right   Past Surgical History:  Procedure Laterality Date  . ABDOMINAL HYSTERECTOMY     ovaries left in place  . APPENDECTOMY    . Back Surgeries    . BREAST REDUCTION SURGERY     3/99  . CARDIAC CATHETERIZATION    . cataracts Bilateral   . CERVICAL SPINE SURGERY    . ESOPHAGEAL MANOMETRY N/A 08/02/2015   Procedure: ESOPHAGEAL MANOMETRY (EM);  Surgeon: Josefine Class, MD;  Location: [facility] ENDOSCOPY;  Service: Endoscopy;  Laterality: N/A;  . ESOPHAGOGASTRODUODENOSCOPY N/A  02/27/2015   Procedure: ESOPHAGOGASTRODUODENOSCOPY (EGD);  Surgeon: Hulen Luster, MD;  Location: [facility] ENDOSCOPY;  Service: Gastroenterology;  Laterality: N/A;  . EXCISIONAL HEMORRHOIDECTOMY    . HEMORRHOID SURGERY    . HIP SURGERY  2013   Right hip surgery  . KNEE ARTHROSCOPY     left and right  . REDUCTION MAMMAPLASTY Bilateral YRS AGO  . REPLACEMENT TOTAL KNEE Bilateral   . rotator cuff surgery     blilateral  . TONSILECTOMY/ADENOIDECTOMY WITH MYRINGOTOMY     Family History  Problem Relation Age of Onset  . Heart disease Mother   . Stroke Mother   . Hypertension Mother   . Heart disease Father        myocardial infarction age 86  . Breast cancer Neg Hx    Social History   Socioeconomic History  . Marital status: Married    Spouse name: Not on file  . Number of children: 2  . Years of education: Not on file  . Highest education level: Not on file  Occupational History  . Not on file  Social Needs  . Financial resource strain: Not very hard  . Food insecurity:    Worry: Never true    Inability:  Never true  . Transportation needs:    Medical: No    Non-medical: No  Tobacco Use  . Smoking status: Never Smoker  . Smokeless tobacco: Never Used  Substance and Sexual Activity  . Alcohol use: No    Alcohol/week: 0.0 standard drinks  . Drug use: No  . Sexual activity: Not Currently  Lifestyle  . Physical activity:    Days per week: Not on file    Minutes per session: Not on file  . Stress: Not on file  Relationships  . Social connections:    Talks on phone: Patient refused    Gets together: Patient refused    Attends religious service: Patient refused    Active member of club or organization: Patient refused    Attends meetings of clubs or organizations: Patient refused    Relationship status: Patient refused  Other Topics Concern  . Not on file  Social History Narrative  . Not on file    Outpatient Encounter Medications as of 06/19/2018  Medication Sig  .  aluminum-magnesium hydroxide-simethicone (MAALOX) 200-200-20 MG/5ML SUSP Take 15 mLs by mouth 4 (four) times daily -  before meals and at bedtime.   . busPIRone (BUSPAR) 10 MG tablet   . Calcium Citrate-Vitamin D 315-250 MG-UNIT TABS Take by mouth.  . Calcium-Magnesium-Vitamin D 600-40-500 MG-MG-UNIT TB24 Take 630 mg by mouth 4 (four) times daily.  [name] Sodium 30-100 MG CAPS Take 1 capsule by mouth daily.   . cephALEXin (KEFLEX) 250 MG capsule Take 250 mg by mouth daily.   . Cholecalciferol (VITAMIN D3) 1000 UNITS CAPS Take by mouth.  . citalopram (CELEXA) 20 MG tablet Take 20 mg by mouth daily.   . diclofenac sodium (VOLTAREN) 1 % GEL Apply topically.  . DULoxetine (CYMBALTA) 60 MG capsule TAKE 1 CAPSULE BY MOUTH EVERY DAY.  Marland Kitchen FLUoxetine (PROZAC) 20 MG tablet   . fluticasone (FLONASE) 50 MCG/ACT nasal spray   . fluticasone (VERAMYST) 27.5 MCG/SPRAY nasal spray Place 1 spray into the nose daily.  Marland Kitchen gabapentin (NEURONTIN) 300 MG capsule Take 300 mg by mouth 3 (three) times daily.   . hydrochlorothiazide (HYDRODIURIL) 25 MG tablet TAKE 1 TABLET BY MOUTH EVERY DAY.  Marland Kitchen HYDROcodone-acetaminophen (NORCO) 10-325 MG per tablet Take 1 tablet by mouth 3 (three) times daily.   . Inulin (FIBER CHOICE PO) Take 2 tablets by mouth daily. Chew tablets  . levothyroxine (SYNTHROID, LEVOTHROID) 100 MCG tablet Take 1 tablet (100 mcg total) by mouth daily.  . magnesium oxide (MAG-OX) 400 MG tablet Take 1 tablet (400 mg total) by mouth daily.  . metoprolol succinate (TOPROL-XL) 100 MG 24 hr tablet TAKE 1 TABLET BY MOUTH TWICE A DAY WITH OR IMMEDIATELY FOLLOWING A MEAL (Patient taking differently: No sig reported)  . morphine (MS CONTIN) 60 MG 12 hr tablet Take 60 mg by mouth 2 (two) times daily.   . mupirocin ointment (BACTROBAN) 2 % Apply to affected area on back bid  . Naldemedine Tosylate (SYMPROIC) 0.2 MG TABS Take 0.2 mg by mouth daily.  Marland Kitchen NYAMYC powder APPLY TOPICALLY 2 TIMES DAILY  .  nystatin cream (MYCOSTATIN) APPLY TO AFFECTED AREA TWICE A DAY.  . pantoprazole (PROTONIX) 40 MG tablet TAKE 1 TABLET BY MOUTH TWICE A DAY.  Marland Kitchen polyethylene glycol powder (GLYCOLAX/MIRALAX) powder MIX 17 GRAMS AS MARKED ON BOTTLE TOP IN 8 OUNCES OF WATER AND DRINK ONCE A DAY AS DIRECTED.  . Probiotic Product (ALIGN PO) Take by mouth daily.  . ranitidine (ZANTAC)  150 MG tablet Take 150 mg by mouth 2 (two) times daily.  . sodium chloride (OCEAN) 0.65 % nasal spray Place 1 spray into the nose as needed.  . triamcinolone cream (KENALOG) 0.1 %   . [DISCONTINUED] amLODipine (NORVASC) 10 MG tablet TAKE 1 TABLET BY MOUTH DAILY  . [DISCONTINUED] atorvastatin (LIPITOR) 20 MG tablet TAKE 1 TABLET BY MOUTH EVERY NIGHT AT BEDTIME  . [DISCONTINUED] benazepril (LOTENSIN) 40 MG tablet TAKE 1 TABLET BY MOUTH DAILY  . [DISCONTINUED] Vitamins/Minerals TABS Take by mouth.   No facility-administered encounter medications on file as of 06/19/2018.     Review of Systems  Constitutional: Negative for appetite change and unexpected weight change.  HENT: Negative for congestion and sinus pressure.   Respiratory: Negative for cough and chest tightness.        Breathing stable.    Cardiovascular: Negative for chest pain, palpitations and leg swelling.       Lower extremity - swelling improved.    Gastrointestinal: Negative for diarrhea, nausea and vomiting.       Abdominal discomfort.    Genitourinary: Negative for difficulty urinating and dysuria.  Musculoskeletal: Negative for myalgias.       Right shoulder pain as outlined.    Skin: Negative for color change and rash.  Neurological: Negative for dizziness, light-headedness and headaches.  Psychiatric/Behavioral: Negative for agitation and dysphoric mood.       Objective:    Physical Exam  Constitutional: She appears well-developed and well-nourished. No distress.  HENT:  Nose: Nose normal.  Mouth/Throat: Oropharynx is clear and moist.  Neck: Neck  supple. No thyromegaly present.  Cardiovascular: Normal rate and regular rhythm.  Pulmonary/Chest: Breath sounds normal. No respiratory distress. She has no wheezes.  Abdominal: Soft. Bowel sounds are normal. There is no tenderness.  Musculoskeletal: She exhibits no edema or tenderness.  Lymphadenopathy:    She has no cervical adenopathy.  Skin: No rash noted. No erythema.  Psychiatric: She has a normal mood and affect. Her behavior is normal.    BP (!) 158/84 (BP Location: Left Arm, Patient Position: Sitting, Cuff Size: Large)   Pulse 61   Temp 97.9 F (36.6 C) (Oral)   Resp 16   Wt 188 lb (85.3 kg)   SpO2 96%   BMI 30.34 kg/m  Wt Readings from Last 3 Encounters:  06/19/18 188 lb (85.3 kg)  06/12/18 186 lb (84.4 kg)  02/10/18 185 lb 8 oz (84.1 kg)     Lab Results  Component Value Date   WBC 6.6 04/27/2018   HGB 10.4 (L) 04/27/2018   HCT 31.0 (L) 04/27/2018   PLT 152 04/27/2018   GLUCOSE 100 (H) 04/27/2018   CHOL 126 04/27/2018   TRIG 89.0 04/27/2018   HDL 45.40 04/27/2018   LDLCALC 62 04/27/2018   ALT 9 04/27/2018   AST 15 04/27/2018   NA 141 04/27/2018   K 4.6 04/27/2018   CL 104 04/27/2018   CREATININE 0.70 04/30/2018   BUN 15 04/27/2018   CO2 31 04/27/2018   TSH 0.45 06/16/2018   INR 1.0 12/08/2013   HGBA1C 6.0 10/19/2014    Ct Abdomen Pelvis W Contrast  Result Date: 05/01/2018 CLINICAL DATA:  Mid abdominal pain for several months. Unintentional weight loss. EXAM: CT ABDOMEN AND PELVIS WITH CONTRAST TECHNIQUE: Multidetector CT imaging of the abdomen and pelvis was performed using the standard protocol following bolus administration of intravenous contrast. CONTRAST:  181m ISOVUE-300 IOPAMIDOL (ISOVUE-300) INJECTION 61% COMPARISON:  CT scan of  October 28, 2014. FINDINGS: Lower chest: No acute abnormality. Stable subpleural nodule seen in right lower lobe which can be considered benign at this point. Hepatobiliary: No gallstones are noted. Stable intrahepatic  biliary dilatation is noted. The liver is unremarkable. Pancreas: Unremarkable. No pancreatic ductal dilatation or surrounding inflammatory changes. Spleen: Normal in size without focal abnormality. Adrenals/Urinary Tract: Stable right adrenal myelolipoma. Left adrenal gland appears normal. Stable right renal cysts are noted. No hydronephrosis or renal obstruction is noted. No renal or ureteral calculi are noted. Urinary bladder is unremarkable. Stomach/Bowel: The stomach appears normal. There is no evidence of bowel obstruction or inflammation. Stool is noted throughout the colon. Status post appendectomy. Vascular/Lymphatic: Aortic atherosclerosis. No enlarged abdominal or pelvic lymph nodes. Reproductive: Status post hysterectomy. No adnexal masses. Other: Small fat containing periumbilical hernia is noted. No abnormal fluid collection is noted. Musculoskeletal: No acute or significant osseous findings. IMPRESSION: Stable right adrenal myelolipoma. Small fat containing periumbilical hernia. No acute abnormality seen in the abdomen or pelvis. Aortic Atherosclerosis (ICD10-I70.0). Electronically Signed   By: Marijo Conception, M.D.   On: 05/01/2018 09:29       Assessment & Plan:   Problem List Items Addressed This Visit    Abdominal pain    Postprandial pain and previous weight loss.  CT as outlined.  Saw vascular surgery.  Planning for mesenteric duplex for further evaluation.        Anemia    Followed by hematology.        CHF (congestive heart failure) ([place])    Continue f/u with cardiology.  No evidence of volume overload.        Chronic venous insufficiency    Compression hose.        GERD (gastroesophageal reflux disease)    Controlled on current regimen.        Hyperlipidemia    On lipitor.  Low cholesterol diet and exercise.  Follow lipid panel and liver function tests.        Relevant Orders   Lipid panel   Hepatic function panel   Hypertension    Blood pressure slightly  elevated today.  Recheck improved.  Will have her spot check her pressure.  Notify if persistent elevation.        Relevant Orders   Basic metabolic panel   Hypothyroidism    On thyroid replacement. Follow tsh.        Relevant Orders   TSH   Lymphedema    Lymphedema pump.  Swelling improved.         OSA (obstructive sleep apnea)    CPAP.        Pulmonary hypertension ([place])    Followed by cardiology.  Also evaluated by pulmonary.  Continue cpap.        Stress    Followed by Dr Nicolasa Ducking.  Stable.            Einar Pheasant, MD

## 2018-06-22 ENCOUNTER — Encounter: Payer: Self-pay | Admitting: Internal Medicine

## 2018-06-22 ENCOUNTER — Other Ambulatory Visit: Payer: Self-pay | Admitting: Internal Medicine

## 2018-06-22 NOTE — Assessment & Plan Note (Signed)
Followed by Dr Nicolasa Ducking.  Stable.

## 2018-06-22 NOTE — Assessment & Plan Note (Signed)
Lymphedema pump.  Swelling improved.

## 2018-06-22 NOTE — Assessment & Plan Note (Signed)
Followed by hematology.

## 2018-06-22 NOTE — Assessment & Plan Note (Signed)
Followed by cardiology.  Also evaluated by pulmonary.  Continue cpap.

## 2018-06-22 NOTE — Assessment & Plan Note (Signed)
Blood pressure slightly elevated today.  Recheck improved.  Will have her spot check her pressure.  Notify if persistent elevation.

## 2018-06-22 NOTE — Assessment & Plan Note (Signed)
On lipitor.  Low cholesterol diet and exercise.  Follow lipid panel and liver function tests.   

## 2018-06-22 NOTE — Assessment & Plan Note (Signed)
CPAP.  

## 2018-06-22 NOTE — Assessment & Plan Note (Signed)
Compression hose.

## 2018-06-22 NOTE — Assessment & Plan Note (Signed)
Continue f/u with cardiology.  No evidence of volume overload.

## 2018-06-22 NOTE — Assessment & Plan Note (Signed)
Controlled on current regimen.   

## 2018-06-22 NOTE — Assessment & Plan Note (Signed)
Postprandial pain and previous weight loss.  CT as outlined.  Saw vascular surgery.  Planning for mesenteric duplex for further evaluation.

## 2018-06-22 NOTE — Assessment & Plan Note (Signed)
On thyroid replacement.  Follow tsh.  

## 2018-07-03 ENCOUNTER — Ambulatory Visit (INDEPENDENT_AMBULATORY_CARE_PROVIDER_SITE_OTHER): Payer: PPO | Admitting: Family Medicine

## 2018-07-03 ENCOUNTER — Other Ambulatory Visit: Payer: Self-pay | Admitting: Internal Medicine

## 2018-07-03 ENCOUNTER — Encounter: Payer: Self-pay | Admitting: Family Medicine

## 2018-07-03 VITALS — BP 146/72 | HR 76 | Temp 98.3°F | Wt 183.8 lb

## 2018-07-03 DIAGNOSIS — R350 Frequency of micturition: Secondary | ICD-10-CM

## 2018-07-03 DIAGNOSIS — R309 Painful micturition, unspecified: Secondary | ICD-10-CM

## 2018-07-03 LAB — POCT URINALYSIS DIPSTICK
Bilirubin, UA: NEGATIVE
Blood, UA: NEGATIVE
Glucose, UA: NEGATIVE
KETONES UA: NEGATIVE
LEUKOCYTES UA: NEGATIVE
Nitrite, UA: NEGATIVE
PH UA: 7.5 (ref 5.0–8.0)
PROTEIN UA: NEGATIVE
SPEC GRAV UA: 1.01 (ref 1.010–1.025)
UROBILINOGEN UA: 0.2 U/dL

## 2018-07-03 MED ORDER — NITROFURANTOIN MONOHYD MACRO 100 MG PO CAPS: 100.0000 mg | ORAL_CAPSULE | Freq: Two times a day (BID) | ORAL | 0 refills | Status: AC

## 2018-07-03 NOTE — Progress Notes (Signed)
Subjective:    Patient ID: Kimberly Roy, female    DOB: [birth year], 84 y.o.   MRN: [id number]  HPI   Patient presents to clinic complaining of burning and frequency with urination that began this morning.  Patient reports suprapubic pressure.  Patient states this is exactly how all of her UTIs began to present and she is concerned that she will get worse over the weekend.  Denies fever or chills.  Denies nausea, vomiting or diarrhea.  Patient Active Problem List   Diagnosis Date Noted  . Right shoulder pain 05/16/2017  . Chronic venous insufficiency 02/10/2017  . Lymphedema 02/10/2017  . Neuropathy 12/25/2016  . Anemia due to blood loss, chronic 08/07/2016  . GERD (gastroesophageal reflux disease) 12/10/2015  . Finger pain 11/13/2015  . Carotid artery calcification 09/26/2015  . External nasal lesion 09/25/2015  . Muscle cramps 09/01/2015  . Excessive sweating 07/30/2015  . Headache 07/16/2015  . Groin pain 05/14/2015  . Muscle twitching 03/05/2015  . Leg cramps 02/11/2015  . Back pain 02/11/2015  . Abdominal pain 02/11/2015  . Acute cystitis without hematuria 12/12/2014  . Left elbow pain 11/30/2014  . Health care maintenance 11/30/2014  . Osteoporosis 10/19/2014  . Rectal bleeding 10/19/2014  . Neck pain 09/03/2014  . Unsteady gait 09/03/2014  . Nocturia 09/03/2014  . Dysphagia 06/01/2014  . Nasal dryness 06/01/2014  . Stress 06/01/2014  . Fatigue 02/27/2014  . Pre-op evaluation 10/12/2013  . Hoarseness 08/14/2013  . Leg swelling 01/24/2013  . CHF (congestive heart failure) ([place]) 12/29/2012  . Cough 12/29/2012  . OSA (obstructive sleep apnea) 07/09/2012  . Osteoarthritis 07/04/2012  . Anemia 07/04/2012  . Chronic constipation 07/04/2012  . Pulmonary hypertension ([place]) 07/04/2012  . Pulmonary nodules 07/04/2012  . Hypertension 07/04/2012  . Hyperlipidemia 07/04/2012  . Hypothyroidism 07/04/2012   Social History   Tobacco Use  . Smoking status: Never Smoker    . Smokeless tobacco: Never Used  Substance Use Topics  . Alcohol use: No    Alcohol/week: 0.0 standard drinks   Review of Systems  Constitutional: Negative for chills, fatigue and fever.  HENT: Negative for congestion, ear pain, sinus pain and sore throat.   Eyes: Negative.   Respiratory: Negative for cough, shortness of breath and wheezing.   Cardiovascular: Negative for chest pain, palpitations and leg swelling.  Gastrointestinal: Negative for abdominal pain, diarrhea, nausea and vomiting.  Genitourinary: +dysuria, frequency and urgency.  Musculoskeletal: Negative for arthralgias and myalgias.  Skin: Negative for color change, pallor and rash.  Neurological: Negative for syncope, light-headedness and headaches.  Psychiatric/Behavioral: The patient is not nervous/anxious.       Objective:   Physical Exam  Constitutional: She is oriented to person, place, and time. No distress.  Chronically ill elderly female  HENT:  Head: Normocephalic and atraumatic.  Eyes: Conjunctivae and EOM are normal. No scleral icterus.  Neck: Neck supple. No tracheal deviation present.  Cardiovascular: Normal rate and regular rhythm.  Pulmonary/Chest: Effort normal and breath sounds normal.  Abdominal: Soft. There is tenderness.  Suprapubic tenderness.   Musculoskeletal: She exhibits no edema.  Neurological: She is alert and oriented to person, place, and time.  Walks with 4 wheeled walker  Skin: Skin is warm and dry. She is not diaphoretic. No pallor.  Psychiatric: She has a normal mood and affect. Her behavior is normal.      Vitals:   07/03/18 1534 07/03/18 1557  BP: (!) 200/80 (!) 146/72  Pulse: 76  Temp: 98.3 F (36.8 C)   SpO2: 94%     Assessment & Plan:   Painful urination, urinary frequency - urinalysis appears clear on our exam in office.  However due to patient's long list of chronic illnesses, and reports that this is how all of her UTIs present I will treat her with Macrobid  100 mg twice daily for 5 days due to it being the weekend.  Patient advised that if symptoms worsen and do not improve over the weekend, to go to urgent care or emergency room right away.  Macrobid was chosen due to patient's long list of allergies including many and different antibiotics.  Patient advised to increase water intake, do good handwashing, wear cotton underwear.  Keep regularly scheduled follow-up with PCP as planned.  Return to clinic sooner if any issues arise

## 2018-07-05 LAB — URINE CULTURE
MICRO NUMBER:: 91317589
SPECIMEN QUALITY: ADEQUATE

## 2018-07-06 ENCOUNTER — Encounter: Payer: Self-pay | Admitting: Family Medicine

## 2018-07-06 ENCOUNTER — Telehealth: Payer: Self-pay | Admitting: Internal Medicine

## 2018-07-06 ENCOUNTER — Other Ambulatory Visit: Payer: Self-pay

## 2018-07-06 MED ORDER — NYSTATIN 100000 UNIT/GM EX POWD: CUTANEOUS | 0 refills | Status: DC

## 2018-07-06 NOTE — Telephone Encounter (Signed)
Copied from [place]. Topic: Quick Communication - Rx Refill/Question >> Jul 06, 2018 12:20 PM Bea Graff, NT wrote: Medication: [facility] powder  Pharmacy calling and states they do not carry the 30g in stock but do have the 60 if this can be changed for the pt.  Has the patient contacted their pharmacy? Yes.   (Agent: If no, request that the patient contact the pharmacy for the refill.) (Agent: If yes, when and what did the pharmacy advise?)  Preferred Pharmacy (with phone number or street name): [place], [place] - [phone] (Phone) [phone] (Fax)    Agent: Please be advised that RX refills may take up to 3 business days. We ask that you follow-up with your pharmacy.

## 2018-07-06 NOTE — Telephone Encounter (Signed)
rx sent in

## 2018-07-06 NOTE — Telephone Encounter (Signed)
ok 

## 2018-07-06 NOTE — Telephone Encounter (Signed)
Ok to change to the 60g?

## 2018-07-13 ENCOUNTER — Ambulatory Visit: Payer: Self-pay | Admitting: *Deleted

## 2018-07-13 NOTE — Telephone Encounter (Signed)
Please advise if patient should come back sooner to have TSH recheck?

## 2018-07-13 NOTE — Telephone Encounter (Signed)
Charted in error.

## 2018-07-13 NOTE — Telephone Encounter (Signed)
I would have her restart the dose she was on previously.  Last TSH wnl.  Recheck tsh (and other fasting labs) in 8 weeks.

## 2018-07-13 NOTE — Telephone Encounter (Signed)
Patient had TSH drawn on 06/16/18 and per this note she has not taken any thyroid medication in 6 weeks. Does she need her TSH rechecked again?

## 2018-07-13 NOTE — Telephone Encounter (Signed)
Contacted pt regarding her concerns; the pt has not taken her thyroid medication for 6 weeks; she says that she dropped her bottle behind the recliner; because of her husband's health she did not realize that she had not been taking the medication; she is normally seen by Dr Nicki Reaper, [facility] and her last TSH was 06/16/18;  spoke with Judson Roch at [facility] and she will speak with provider for final disposition and will also call the pt back; the pt can be contacted at [phone]..   Reason for Disposition . Caller has URGENT medication question about med that PCP prescribed and triager unable to answer question  Answer Assessment - Initial Assessment Questions 1. SYMPTOMS: "Do you have any symptoms?"     Medication questions 2. SEVERITY: If symptoms are present, ask "Are they mild, moderate or severe?" No symptoms; pt has not taken thyroid medication in 6 weeks and would like to know what dose should she take  Protocols used: MEDICATION QUESTION CALL-A-AH

## 2018-07-14 NOTE — Telephone Encounter (Signed)
Pt aware to restart medication and scheduled for fasting labs on 09/08/2018

## 2018-07-15 ENCOUNTER — Ambulatory Visit (INDEPENDENT_AMBULATORY_CARE_PROVIDER_SITE_OTHER): Payer: PPO

## 2018-07-15 ENCOUNTER — Encounter (INDEPENDENT_AMBULATORY_CARE_PROVIDER_SITE_OTHER): Payer: Self-pay | Admitting: Vascular Surgery

## 2018-07-15 ENCOUNTER — Ambulatory Visit (INDEPENDENT_AMBULATORY_CARE_PROVIDER_SITE_OTHER): Payer: PPO | Admitting: Vascular Surgery

## 2018-07-15 VITALS — BP 151/58 | HR 54 | Resp 17 | Ht 64.0 in | Wt 184.0 lb

## 2018-07-15 DIAGNOSIS — I890 Lymphedema, not elsewhere classified: Secondary | ICD-10-CM

## 2018-07-15 DIAGNOSIS — R1084 Generalized abdominal pain: Secondary | ICD-10-CM | POA: Diagnosis not present

## 2018-07-15 DIAGNOSIS — I872 Venous insufficiency (chronic) (peripheral): Secondary | ICD-10-CM | POA: Diagnosis not present

## 2018-07-15 DIAGNOSIS — K551 Chronic vascular disorders of intestine: Secondary | ICD-10-CM | POA: Insufficient documentation

## 2018-07-15 DIAGNOSIS — I771 Stricture of artery: Secondary | ICD-10-CM | POA: Diagnosis not present

## 2018-07-15 NOTE — Progress Notes (Signed)
Subjective:    Patient ID: Kimberly Roy, female    DOB: 12/29/1930, 84 y.o.   MRN: [id number] Chief Complaint  Patient presents with  . Follow-up    Mesenteric follow up   Patient presents to review vascular studies.  The patient was last seen on June 12, 2018 at the request of her gastroenterologist to rule out mesenteric ischemia.  Patient notes that she still experiences abdominal pain with eating.  Patient denies any nausea vomiting after eating.  The patient denies any changes in her bowel habits.  The patient notes that she has not undergone an endoscopy in the last couple of years.  The patient underwent a mesenteric duplex which was notable for a patent aorta, patent celiac artery and patent IMA.  There is a stenosis of approximately 70% to the SMA.  The patient denies any fever, nausea or vomiting.  The patient continues to engage in conservative therapy including wearing medical grade 1 compression socks, elevating her legs and trying to remain as active as possible.  This seems to be controlling her chronic venous insufficiency/lymphedema symptoms.  Review of Systems  Constitutional: Negative.   HENT: Negative.   Eyes: Negative.   Respiratory: Negative.   Cardiovascular: Negative.   Gastrointestinal: Positive for abdominal pain.  Endocrine: Negative.   Genitourinary: Negative.   Musculoskeletal: Negative.   Skin: Negative.   Allergic/Immunologic: Negative.   Neurological: Negative.   Hematological: Negative.   Psychiatric/Behavioral: Negative.       Objective:   Physical Exam  Constitutional: She is oriented to person, place, and time. She appears well-developed and well-nourished. No distress.  HENT:  Head: Normocephalic and atraumatic.  Right Ear: External ear normal.  Left Ear: External ear normal.  Eyes: Pupils are equal, round, and reactive to light. Conjunctivae and EOM are normal.  Neck: Normal range of motion.  Cardiovascular: Normal rate, regular rhythm,  normal heart sounds and intact distal pulses.  Pulses:      Radial pulses are 2+ on the right side, and 2+ on the left side.  To palpate pedal pulses however the bilateral feet are warm.  There is a good capillary refill bilaterally  Pulmonary/Chest: Effort normal and breath sounds normal.  Abdominal: Soft. Bowel sounds are normal. She exhibits no distension. There is no tenderness. There is no guarding.  Musculoskeletal: Normal range of motion. She exhibits edema (Mild nonpitting edema noted bilaterally.).  Neurological: She is alert and oriented to person, place, and time.  Skin: Skin is warm and dry. She is not diaphoretic.  Psychiatric: She has a normal mood and affect. Her behavior is normal. Judgment and thought content normal.  Vitals reviewed.  BP (!) 151/58 (BP Location: Left Arm, Patient Position: Sitting)   Pulse (!) 54   Resp 17   Ht _0  (1.626 m)   Wt 184 lb (83.5 kg)   BMI 31.58 kg/m   Past Medical History:  Diagnosis Date  . Anemia   . Anxiety   . Chest pain   . CHF (congestive heart failure) ([place])   . Constipation   . DDD (degenerative disc disease), cervical   . Depression   . DVT (deep venous thrombosis) ([place])   . Dysphonia   . Dyspnea   . Fatty liver   . Fatty liver   . Headache   . Hyperlipidemia   . Hyperpiesia   . Hypertension   . Hypothyroidism   . Interstitial cystitis   . Left ventricular dysfunction   .  Lymphedema   . Nephrolithiasis   . Obstructive sleep apnea   . Osteoarthritis    knees/cervical and lumbar spine  . Pulmonary hypertension ([place])   . Pulmonary nodules    followed by Dr Raul Del  . Pure hypercholesterolemia   . Renal cyst    right   Social History   Socioeconomic History  . Marital status: Married    Spouse name: Not on file  . Number of children: 2  . Years of education: Not on file  . Highest education level: Not on file  Occupational History  . Not on file  Social Needs  . Financial resource strain: Not very  hard  . Food insecurity:    Worry: Never true    Inability: Never true  . Transportation needs:    Medical: No    Non-medical: No  Tobacco Use  . Smoking status: Never Smoker  . Smokeless tobacco: Never Used  Substance and Sexual Activity  . Alcohol use: No    Alcohol/week: 0.0 standard drinks  . Drug use: No  . Sexual activity: Not Currently  Lifestyle  . Physical activity:    Days per week: Not on file    Minutes per session: Not on file  . Stress: Not on file  Relationships  . Social connections:    Talks on phone: Patient refused    Gets together: Patient refused    Attends religious service: Patient refused    Active member of club or organization: Patient refused    Attends meetings of clubs or organizations: Patient refused    Relationship status: Patient refused  . Intimate partner violence:    Fear of current or ex partner: No    Emotionally abused: No    Physically abused: No    Forced sexual activity: No  Other Topics Concern  . Not on file  Social History Narrative  . Not on file   Past Surgical History:  Procedure Laterality Date  . ABDOMINAL HYSTERECTOMY     ovaries left in place  . APPENDECTOMY    . Back Surgeries    . BREAST REDUCTION SURGERY     3/99  . CARDIAC CATHETERIZATION    . cataracts Bilateral   . CERVICAL SPINE SURGERY    . ESOPHAGEAL MANOMETRY N/A 08/02/2015   Procedure: ESOPHAGEAL MANOMETRY (EM);  Surgeon: Josefine Class, MD;  Location: [facility] ENDOSCOPY;  Service: Endoscopy;  Laterality: N/A;  . ESOPHAGOGASTRODUODENOSCOPY N/A 02/27/2015   Procedure: ESOPHAGOGASTRODUODENOSCOPY (EGD);  Surgeon: Hulen Luster, MD;  Location[facility] ENDOSCOPY;  Service: Gastroenterology;  Laterality: N/A;  . EXCISIONAL HEMORRHOIDECTOMY    . HEMORRHOID SURGERY    . HIP SURGERY  2013   Right hip surgery  . KNEE ARTHROSCOPY     left and right  . REDUCTION MAMMAPLASTY Bilateral YRS AGO  . REPLACEMENT TOTAL KNEE Bilateral   . rotator cuff surgery      blilateral  . TONSILECTOMY/ADENOIDECTOMY WITH MYRINGOTOMY     Family History  Problem Relation Age of Onset  . Heart disease Mother   . Stroke Mother   . Hypertension Mother   . Heart disease Father        myocardial infarction age 69  . Breast cancer Neg Hx    Allergies  Allergen Reactions  . Lyrica [Pregabalin] Swelling  . Atarax [Hydroxyzine]     jittery  . Dicyclomine Other (See Comments)    Abdominal bloating   . Hydroxyzine Hcl     jittery  .  Levaquin [Levofloxacin] Swelling  . Nucynta Er [Tapentadol Hcl Er] Other (See Comments)    Severe constipation   . Oxybutynin Other (See Comments)    Blurred vision  . Zoloft [Sertraline Hcl]     Severe headache  . Biaxin [Clarithromycin] Other (See Comments) and Rash    Pt does not remember Pt does not remember  . Sertraline Nausea And Vomiting    Severe headache Severe headache Other reaction(s): Headache Severe headache  . Sulfa Antibiotics Rash    Pt does not remember  . Sulfasalazine Rash    Pt does not remember  . Tape Rash    [place] - redness  . Tapentadol Other (See Comments) and Rash    _0       Assessment & Plan:  Patient presents to review vascular studies.  The patient was last seen on June 12, 2018 at the request of her gastroenterologist to rule out mesenteric ischemia.  Patient notes that she still experiences abdominal pain with eating.  Patient denies any nausea vomiting after eating.  The patient denies any changes in her bowel habits.  The patient notes that she has not undergone an endoscopy in the last couple of years.  The patient underwent a mesenteric duplex which was notable for a patent aorta, patent celiac artery and patent IMA.  There is a stenosis of approximately 70% to the SMA.  The patient denies any fever, nausea or vomiting.  The patient continues to engage in conservative therapy  including wearing medical grade 1 compression socks, elevating her legs and trying to remain as active as possible.  This seems to be controlling her chronic venous insufficiency/lymphedema symptoms.  1. Superior mesenteric artery stenosis [facility]) - New Patient with stenosis on today's duplex of the SMA approximately 70% Patent aorta, celiac and IMA We had a long discussion about possibly moving forward with a mesenteric angiogram to assess this area of stenosis and then if appropriate an attempt to revascularize the SMA can be made at that time. The patient would like to follow-up with her gastroenterologist first and possibly undergo an EGD. Patient on statin. Would consider adding a baby aspirin to daily regimen.  At this time, I will bring the patient back in 6 months.  The patient understands that if her abdominal pain should worsen or if she should start to experience increasing nausea and vomiting she should call the office sooner and we will move forward with the mesenteric angiogram The patient and her daughter who was present at today's exam are in agreement  - VAS [place] MESENTERIC DUPLEX; Future  2. Chronic venous insufficiency - Stable The patient engages in conservative therapy including wearing medical grade 1 compression socks, elevating her legs and remaining active. This seems to be controlling the patient's bilateral lower extremity edema The patient should continue doing this on a daily basis  3. Lymphedema - Stable As above  Current Outpatient Medications on File Prior to Visit  Medication Sig Dispense Refill  . aluminum-magnesium hydroxide-simethicone (MAALOX) 340-370-96 MG/5ML SUSP Take 15 mLs by mouth 4 (four) times daily -  before meals and at bedtime.     Marland Kitchen amLODipine (NORVASC) 10 MG tablet TAKE 1 TABLET BY MOUTH DAILY 90 tablet 1  . atorvastatin (LIPITOR) 20 MG tablet TAKE ONE TABLET BY MOUTH AT BEDTIME 90 tablet 1  . benazepril (LOTENSIN) 40 MG tablet TAKE 1 TABLET BY  MOUTH DAILY 90 tablet 1  . busPIRone (  BUSPAR) 10 MG tablet     . Calcium-Magnesium-Vitamin D 600-40-500 MG-MG-UNIT TB24 Take 630 mg by mouth 4 (four) times daily.    [name] Sodium 30-100 MG CAPS Take 1 capsule by mouth daily.     . cephALEXin (KEFLEX) 250 MG capsule Take 250 mg by mouth daily.     . Cholecalciferol (VITAMIN D3) 1000 UNITS CAPS Take by mouth.    . diclofenac sodium (VOLTAREN) 1 % GEL Apply topically.    . DULoxetine (CYMBALTA) 60 MG capsule TAKE 1 CAPSULE BY MOUTH EVERY DAY. 30 capsule 3  . FLUoxetine (PROZAC) 20 MG tablet     . fluticasone (FLONASE) 50 MCG/ACT nasal spray     . gabapentin (NEURONTIN) 300 MG capsule Take 300 mg by mouth 3 (three) times daily.     . hydrochlorothiazide (HYDRODIURIL) 25 MG tablet TAKE 1 TABLET BY MOUTH EVERY DAY. 90 tablet 1  . HYDROcodone-acetaminophen (NORCO) 10-325 MG per tablet Take 1 tablet by mouth 3 (three) times daily.     . Inulin (FIBER CHOICE PO) Take 2 tablets by mouth daily. Chew tablets    . levothyroxine (SYNTHROID, LEVOTHROID) 100 MCG tablet Take 1 tablet (100 mcg total) by mouth daily. 90 tablet 3  . magnesium oxide (MAG-OX) 400 MG tablet Take 1 tablet (400 mg total) by mouth daily. 30 tablet 1  . metoprolol succinate (TOPROL-XL) 100 MG 24 hr tablet TAKE 1 TABLET BY MOUTH TWICE A DAY WITH OR IMMEDIATELY FOLLOWING A MEAL (Patient taking differently: No sig reported) 180 tablet 2  . morphine (MS CONTIN) 60 MG 12 hr tablet Take 60 mg by mouth 2 (two) times daily.     . mupirocin ointment (BACTROBAN) 2 % Apply to affected area on back bid 22 g 0  . Naldemedine Tosylate (SYMPROIC) 0.2 MG TABS Take 0.2 mg by mouth daily.    Marland Kitchen nystatin (NYAMYC) powder APPLY TOPICALLY 2 TIMES DAILY 60 g 0  . pantoprazole (PROTONIX) 40 MG tablet TAKE 1 TABLET BY MOUTH TWICE A DAY. 60 tablet 3  . polyethylene glycol powder (GLYCOLAX/MIRALAX) powder MIX 17 GRAMS AS MARKED ON BOTTLE TOP IN 8 OUNCES OF WATER AND DRINK ONCE A DAY AS DIRECTED.  527 g 3  . Probiotic Product (ALIGN PO) Take by mouth daily.    . sodium chloride (OCEAN) 0.65 % nasal spray Place 1 spray into the nose as needed.    . triamcinolone cream (KENALOG) 0.1 %      No current facility-administered medications on file prior to visit.    There are no Patient Instructions on file for this visit. No follow-ups on file.  Chauntel Windsor A Berthe Oley, PA-C

## 2018-07-16 DIAGNOSIS — I272 Pulmonary hypertension, unspecified: Secondary | ICD-10-CM | POA: Diagnosis not present

## 2018-07-16 DIAGNOSIS — R0609 Other forms of dyspnea: Secondary | ICD-10-CM | POA: Diagnosis not present

## 2018-07-16 DIAGNOSIS — G4733 Obstructive sleep apnea (adult) (pediatric): Secondary | ICD-10-CM | POA: Diagnosis not present

## 2018-07-21 DIAGNOSIS — F419 Anxiety disorder, unspecified: Secondary | ICD-10-CM | POA: Diagnosis not present

## 2018-07-21 DIAGNOSIS — F5105 Insomnia due to other mental disorder: Secondary | ICD-10-CM | POA: Diagnosis not present

## 2018-07-21 DIAGNOSIS — F330 Major depressive disorder, recurrent, mild: Secondary | ICD-10-CM | POA: Diagnosis not present

## 2018-07-27 DIAGNOSIS — R1084 Generalized abdominal pain: Secondary | ICD-10-CM | POA: Diagnosis not present

## 2018-08-06 ENCOUNTER — Emergency Department: Payer: PPO

## 2018-08-06 ENCOUNTER — Inpatient Hospital Stay
Admission: EM | Admit: 2018-08-06 | Discharge: 2018-08-10 | DRG: 480 | Disposition: A | Discharge status: Skilled Nursing Facility | Payer: PPO | Attending: Internal Medicine | Admitting: Internal Medicine

## 2018-08-06 ENCOUNTER — Other Ambulatory Visit: Payer: Self-pay

## 2018-08-06 DIAGNOSIS — W010XXA Fall on same level from slipping, tripping and stumbling without subsequent striking against object, initial encounter: Secondary | ICD-10-CM | POA: Diagnosis present

## 2018-08-06 DIAGNOSIS — Z918 Other specified personal risk factors, not elsewhere classified: Secondary | ICD-10-CM | POA: Diagnosis not present

## 2018-08-06 DIAGNOSIS — F329 Major depressive disorder, single episode, unspecified: Secondary | ICD-10-CM | POA: Diagnosis not present

## 2018-08-06 DIAGNOSIS — Z882 Allergy status to sulfonamides status: Secondary | ICD-10-CM | POA: Diagnosis not present

## 2018-08-06 DIAGNOSIS — Z96653 Presence of artificial knee joint, bilateral: Secondary | ICD-10-CM | POA: Diagnosis not present

## 2018-08-06 DIAGNOSIS — S7290XA Unspecified fracture of unspecified femur, initial encounter for closed fracture: Secondary | ICD-10-CM | POA: Diagnosis present

## 2018-08-06 DIAGNOSIS — M1990 Unspecified osteoarthritis, unspecified site: Secondary | ICD-10-CM | POA: Diagnosis not present

## 2018-08-06 DIAGNOSIS — W19XXXA Unspecified fall, initial encounter: Secondary | ICD-10-CM | POA: Diagnosis not present

## 2018-08-06 DIAGNOSIS — Z86718 Personal history of other venous thrombosis and embolism: Secondary | ICD-10-CM

## 2018-08-06 DIAGNOSIS — M5030 Other cervical disc degeneration, unspecified cervical region: Secondary | ICD-10-CM | POA: Diagnosis present

## 2018-08-06 DIAGNOSIS — K5900 Constipation, unspecified: Secondary | ICD-10-CM | POA: Diagnosis not present

## 2018-08-06 DIAGNOSIS — Z8249 Family history of ischemic heart disease and other diseases of the circulatory system: Secondary | ICD-10-CM

## 2018-08-06 DIAGNOSIS — Z741 Need for assistance with personal care: Secondary | ICD-10-CM | POA: Diagnosis not present

## 2018-08-06 DIAGNOSIS — S72341A Displaced spiral fracture of shaft of right femur, initial encounter for closed fracture: Secondary | ICD-10-CM | POA: Diagnosis not present

## 2018-08-06 DIAGNOSIS — Z79899 Other long term (current) drug therapy: Secondary | ICD-10-CM

## 2018-08-06 DIAGNOSIS — I272 Pulmonary hypertension, unspecified: Secondary | ICD-10-CM | POA: Diagnosis present

## 2018-08-06 DIAGNOSIS — E039 Hypothyroidism, unspecified: Secondary | ICD-10-CM | POA: Diagnosis not present

## 2018-08-06 DIAGNOSIS — I110 Hypertensive heart disease with heart failure: Secondary | ICD-10-CM | POA: Diagnosis present

## 2018-08-06 DIAGNOSIS — S299XXA Unspecified injury of thorax, initial encounter: Secondary | ICD-10-CM | POA: Diagnosis not present

## 2018-08-06 DIAGNOSIS — I5032 Chronic diastolic (congestive) heart failure: Secondary | ICD-10-CM | POA: Diagnosis present

## 2018-08-06 DIAGNOSIS — Z8781 Personal history of (healed) traumatic fracture: Secondary | ICD-10-CM

## 2018-08-06 DIAGNOSIS — F419 Anxiety disorder, unspecified: Secondary | ICD-10-CM | POA: Diagnosis not present

## 2018-08-06 DIAGNOSIS — Z888 Allergy status to other drugs, medicaments and biological substances status: Secondary | ICD-10-CM | POA: Diagnosis not present

## 2018-08-06 DIAGNOSIS — Z9889 Other specified postprocedural states: Secondary | ICD-10-CM

## 2018-08-06 DIAGNOSIS — I509 Heart failure, unspecified: Secondary | ICD-10-CM | POA: Diagnosis not present

## 2018-08-06 DIAGNOSIS — I959 Hypotension, unspecified: Secondary | ICD-10-CM | POA: Diagnosis not present

## 2018-08-06 DIAGNOSIS — R52 Pain, unspecified: Secondary | ICD-10-CM | POA: Diagnosis not present

## 2018-08-06 DIAGNOSIS — E785 Hyperlipidemia, unspecified: Secondary | ICD-10-CM | POA: Diagnosis not present

## 2018-08-06 DIAGNOSIS — D62 Acute posthemorrhagic anemia: Secondary | ICD-10-CM | POA: Diagnosis not present

## 2018-08-06 DIAGNOSIS — T148XXA Other injury of unspecified body region, initial encounter: Secondary | ICD-10-CM

## 2018-08-06 DIAGNOSIS — E7800 Pure hypercholesterolemia, unspecified: Secondary | ICD-10-CM | POA: Diagnosis present

## 2018-08-06 DIAGNOSIS — M9701XA Periprosthetic fracture around internal prosthetic right hip joint, initial encounter: Principal | ICD-10-CM | POA: Diagnosis present

## 2018-08-06 DIAGNOSIS — Z91048 Other nonmedicinal substance allergy status: Secondary | ICD-10-CM | POA: Diagnosis not present

## 2018-08-06 DIAGNOSIS — Y92009 Unspecified place in unspecified non-institutional (private) residence as the place of occurrence of the external cause: Secondary | ICD-10-CM | POA: Diagnosis not present

## 2018-08-06 DIAGNOSIS — S72351A Displaced comminuted fracture of shaft of right femur, initial encounter for closed fracture: Secondary | ICD-10-CM | POA: Diagnosis not present

## 2018-08-06 DIAGNOSIS — S72401A Unspecified fracture of lower end of right femur, initial encounter for closed fracture: Secondary | ICD-10-CM | POA: Diagnosis not present

## 2018-08-06 DIAGNOSIS — R278 Other lack of coordination: Secondary | ICD-10-CM | POA: Diagnosis not present

## 2018-08-06 DIAGNOSIS — G4733 Obstructive sleep apnea (adult) (pediatric): Secondary | ICD-10-CM | POA: Diagnosis not present

## 2018-08-06 DIAGNOSIS — S72001A Fracture of unspecified part of neck of right femur, initial encounter for closed fracture: Secondary | ICD-10-CM | POA: Diagnosis not present

## 2018-08-06 DIAGNOSIS — R262 Difficulty in walking, not elsewhere classified: Secondary | ICD-10-CM | POA: Diagnosis not present

## 2018-08-06 DIAGNOSIS — W19XXXD Unspecified fall, subsequent encounter: Secondary | ICD-10-CM | POA: Diagnosis not present

## 2018-08-06 DIAGNOSIS — M6281 Muscle weakness (generalized): Secondary | ICD-10-CM | POA: Diagnosis not present

## 2018-08-06 DIAGNOSIS — K219 Gastro-esophageal reflux disease without esophagitis: Secondary | ICD-10-CM | POA: Diagnosis not present

## 2018-08-06 DIAGNOSIS — S79191A Other physeal fracture of lower end of right femur, initial encounter for closed fracture: Secondary | ICD-10-CM | POA: Diagnosis not present

## 2018-08-06 DIAGNOSIS — M2550 Pain in unspecified joint: Secondary | ICD-10-CM | POA: Diagnosis not present

## 2018-08-06 DIAGNOSIS — M25519 Pain in unspecified shoulder: Secondary | ICD-10-CM | POA: Diagnosis not present

## 2018-08-06 DIAGNOSIS — Z01810 Encounter for preprocedural cardiovascular examination: Secondary | ICD-10-CM | POA: Diagnosis not present

## 2018-08-06 DIAGNOSIS — R001 Bradycardia, unspecified: Secondary | ICD-10-CM | POA: Diagnosis not present

## 2018-08-06 DIAGNOSIS — S7291XA Unspecified fracture of right femur, initial encounter for closed fracture: Secondary | ICD-10-CM | POA: Diagnosis not present

## 2018-08-06 DIAGNOSIS — Z9181 History of falling: Secondary | ICD-10-CM | POA: Diagnosis not present

## 2018-08-06 DIAGNOSIS — I10 Essential (primary) hypertension: Secondary | ICD-10-CM | POA: Diagnosis not present

## 2018-08-06 DIAGNOSIS — Z7401 Bed confinement status: Secondary | ICD-10-CM | POA: Diagnosis not present

## 2018-08-06 LAB — CBC
HCT: 34.2 % — ABNORMAL LOW (ref 36.0–46.0)
HEMOGLOBIN: 11.1 g/dL — AB (ref 12.0–15.0)
MCH: 32.4 pg (ref 26.0–34.0)
MCHC: 32.5 g/dL (ref 30.0–36.0)
MCV: 99.7 fL (ref 80.0–100.0)
Platelets: 178 10*3/uL (ref 150–400)
RBC: 3.43 MIL/uL — AB (ref 3.87–5.11)
RDW: 13.5 % (ref 11.5–15.5)
WBC: 10.7 10*3/uL — ABNORMAL HIGH (ref 4.0–10.5)
nRBC: 0 % (ref 0.0–0.2)

## 2018-08-06 LAB — BASIC METABOLIC PANEL
Anion gap: 10 (ref 5–15)
BUN: 22 mg/dL (ref 8–23)
CHLORIDE: 99 mmol/L (ref 98–111)
CO2: 29 mmol/L (ref 22–32)
Calcium: 9.6 mg/dL (ref 8.9–10.3)
Creatinine, Ser: 0.94 mg/dL (ref 0.44–1.00)
GFR calc Af Amer: >60 mL/min (ref >60)
GFR calc non Af Amer: 55 mL/min — ABNORMAL LOW (ref >60)
Glucose, Bld: 124 mg/dL — ABNORMAL HIGH (ref 70–99)
Potassium: 4 mmol/L (ref 3.5–5.1)
Sodium: 138 mmol/L (ref 135–145)

## 2018-08-06 LAB — PROTIME-INR
INR: 0.89
Prothrombin Time: 12 seconds (ref 11.4–15.2)

## 2018-08-06 LAB — APTT: aPTT: 31 seconds (ref 24–36)

## 2018-08-06 MED ORDER — MORPHINE SULFATE (PF) 4 MG/ML IV SOLN
4.0000 mg | INTRAVENOUS | Status: DC | PRN
  Administered 2018-08-06 – 2018-08-07 (×7): 4 mg via INTRAVENOUS
  Filled 2018-08-06 (×7): qty 1

## 2018-08-06 MED ORDER — FENTANYL CITRATE (PF) 100 MCG/2ML IJ SOLN
25.0000 ug | Freq: Once | INTRAMUSCULAR | Status: AC
  Administered 2018-08-06: 25 ug via INTRAVENOUS
  Filled 2018-08-06: qty 2

## 2018-08-06 MED ORDER — ONDANSETRON HCL 4 MG/2ML IJ SOLN
4.0000 mg | Freq: Once | INTRAMUSCULAR | Status: AC
  Administered 2018-08-06: 4 mg via INTRAVENOUS
  Filled 2018-08-06: qty 2

## 2018-08-06 NOTE — ED Notes (Signed)
Patient transported to X-ray 

## 2018-08-06 NOTE — ED Provider Notes (Signed)
ED ECG REPORT I, Hinda Kehr, the attending physician, personally viewed and interpreted this ECG.  Date: 08/06/2018 EKG Time: 23:19 Rate: 51 Rhythm: sinus bradycardia QRS Axis: normal Intervals: IVCD ST/T Wave abnormalities: Non-specific ST segment / T-wave changes, but no evidence of acute ischemia. Narrative Interpretation: no evidence of acute ischemia     Hinda Kehr, MD 08/06/18 2337

## 2018-08-06 NOTE — ED Provider Notes (Signed)
[facility] Provider Note  ____________________________________________  Time seen: Approximately 10:31 PM  I have reviewed the triage vital signs and the nursing notes.   HISTORY  Chief Complaint Fall   HPI Kimberly Roy is a 84 y.o. female with a history of a previously broken right hip and right knee replacement as well as several chronic medical problems as listed below who presents for evaluation of fall and right leg pain.  Patient reports that she was walking in her room between her recliner in the commode when she tripped and fell onto her right hip.  She is complaining of immediate severe sharp pain, she was unable to ambulate.  The pain is mild at rest after receiving fentanyl per EMS but becomes severe with minimal movement of the leg.  She denies head trauma or LOC.  She is not on blood thinners.  She denies back pain or neck pain.  Past Medical History:  Diagnosis Date  . Anemia   . Anxiety   . Chest pain   . CHF (congestive heart failure) ([place])   . Constipation   . DDD (degenerative disc disease), cervical   . Depression   . DVT (deep venous thrombosis) ([place])   . Dysphonia   . Dyspnea   . Fatty liver   . Fatty liver   . Headache   . Hyperlipidemia   . Hyperpiesia   . Hypertension   . Hypothyroidism   . Interstitial cystitis   . Left ventricular dysfunction   . Lymphedema   . Nephrolithiasis   . Obstructive sleep apnea   . Osteoarthritis    knees/cervical and lumbar spine  . Pulmonary hypertension ([place])   . Pulmonary nodules    followed by Dr Raul Del  . Pure hypercholesterolemia   . Renal cyst    right    Patient Active Problem List   Diagnosis Date Noted  . Superior mesenteric artery stenosis ([place]) 07/15/2018  . Right shoulder pain 05/16/2017  . Chronic venous insufficiency 02/10/2017  . Lymphedema 02/10/2017  . Neuropathy 12/25/2016  . Anemia due to blood loss, chronic 08/07/2016  . GERD  (gastroesophageal reflux disease) 12/10/2015  . Finger pain 11/13/2015  . Carotid artery calcification 09/26/2015  . External nasal lesion 09/25/2015  . Muscle cramps 09/01/2015  . Excessive sweating 07/30/2015  . Headache 07/16/2015  . Groin pain 05/14/2015  . Muscle twitching 03/05/2015  . Leg cramps 02/11/2015  . Back pain 02/11/2015  . Abdominal pain 02/11/2015  . Acute cystitis without hematuria 12/12/2014  . Left elbow pain 11/30/2014  . Health care maintenance 11/30/2014  . Osteoporosis 10/19/2014  . Rectal bleeding 10/19/2014  . Neck pain 09/03/2014  . Unsteady gait 09/03/2014  . Nocturia 09/03/2014  . Dysphagia 06/01/2014  . Nasal dryness 06/01/2014  . Stress 06/01/2014  . Fatigue 02/27/2014  . Pre-op evaluation 10/12/2013  . Hoarseness 08/14/2013  . Leg swelling 01/24/2013  . CHF (congestive heart failure) ([place]) 12/29/2012  . Cough 12/29/2012  . OSA (obstructive sleep apnea) 07/09/2012  . Osteoarthritis 07/04/2012  . Anemia 07/04/2012  . Chronic constipation 07/04/2012  . Pulmonary hypertension ([place]) 07/04/2012  . Pulmonary nodules 07/04/2012  . Hypertension 07/04/2012  . Hyperlipidemia 07/04/2012  . Hypothyroidism 07/04/2012    Past Surgical History:  Procedure Laterality Date  . ABDOMINAL HYSTERECTOMY     ovaries left in place  . APPENDECTOMY    . Back Surgeries    . BREAST REDUCTION SURGERY  3/99  . CARDIAC CATHETERIZATION    . cataracts Bilateral   . CERVICAL SPINE SURGERY    . ESOPHAGEAL MANOMETRY N/A 08/02/2015   Procedure: ESOPHAGEAL MANOMETRY (EM);  Surgeon: Josefine Class, MD;  Location: [facility] ENDOSCOPY;  Service: Endoscopy;  Laterality: N/A;  . ESOPHAGOGASTRODUODENOSCOPY N/A 02/27/2015   Procedure: ESOPHAGOGASTRODUODENOSCOPY (EGD);  Surgeon: Hulen Luster, MD;  Location: [facility] ENDOSCOPY;  Service: Gastroenterology;  Laterality: N/A;  . EXCISIONAL HEMORRHOIDECTOMY    . HEMORRHOID SURGERY    . HIP SURGERY  2013   Right hip surgery  . KNEE  ARTHROSCOPY     left and right  . REDUCTION MAMMAPLASTY Bilateral YRS AGO  . REPLACEMENT TOTAL KNEE Bilateral   . rotator cuff surgery     blilateral  . TONSILECTOMY/ADENOIDECTOMY WITH MYRINGOTOMY      Prior to Admission medications   Medication Sig Start Date End Date Taking? Authorizing Provider  aluminum-magnesium hydroxide-simethicone (MAALOX) [phone] MG/5ML SUSP Take 15 mLs by mouth 4 (four) times daily -  before meals and at bedtime.     [provider]  amLODipine (NORVASC) 10 MG tablet TAKE 1 TABLET BY MOUTH DAILY 06/22/18   Einar Pheasant, MD  atorvastatin (LIPITOR) 20 MG tablet TAKE ONE TABLET BY MOUTH AT BEDTIME 06/22/18   Einar Pheasant, MD  benazepril (LOTENSIN) 40 MG tablet TAKE 1 TABLET BY MOUTH DAILY 06/22/18   Einar Pheasant, MD  busPIRone (BUSPAR) 10 MG tablet  03/31/18   [provider]  Calcium-Magnesium-Vitamin D 600-40-500 MG-MG-UNIT TB24 Take 630 mg by mouth 4 (four) times daily.    [provider]  Casanthranol-Docusate Sodium 30-100 MG CAPS Take 1 capsule by mouth daily.     [provider]  cephALEXin (KEFLEX) 250 MG capsule Take 250 mg by mouth daily.  01/23/17   [provider]  Cholecalciferol (VITAMIN D3) 1000 UNITS CAPS Take by mouth.    [provider]  diclofenac sodium (VOLTAREN) 1 % GEL Apply topically. 10/16/17 10/16/18  [provider]  DULoxetine (CYMBALTA) 60 MG capsule TAKE 1 CAPSULE BY MOUTH EVERY DAY. 12/25/16   Einar Pheasant, MD  FLUoxetine (PROZAC) 20 MG tablet  08/12/17   [provider]  fluticasone Asencion Islam) 50 MCG/ACT nasal spray  03/19/18   [provider]  gabapentin (NEURONTIN) 300 MG capsule Take 300 mg by mouth 3 (three) times daily.     [provider]  hydrochlorothiazide (HYDRODIURIL) 25 MG tablet TAKE 1 TABLET BY MOUTH EVERY DAY. 07/18/17   Einar Pheasant, MD  HYDROcodone-acetaminophen (NORCO) 10-325 MG per tablet Take 1 tablet by mouth 3  (three) times daily.     [provider]  Inulin (FIBER CHOICE PO) Take 2 tablets by mouth daily. Chew tablets    [provider]  levothyroxine (SYNTHROID, LEVOTHROID) 100 MCG tablet Take 1 tablet (100 mcg total) by mouth daily. 05/05/18   Einar Pheasant, MD  magnesium oxide (MAG-OX) 400 MG tablet Take 1 tablet (400 mg total) by mouth daily. 02/11/15   Einar Pheasant, MD  metoprolol succinate (TOPROL-XL) 100 MG 24 hr tablet TAKE 1 TABLET BY MOUTH TWICE A DAY WITH OR IMMEDIATELY FOLLOWING A MEAL Patient taking differently: No sig reported 06/02/18   Einar Pheasant, MD  morphine (MS CONTIN) 60 MG 12 hr tablet Take 60 mg by mouth 2 (two) times daily.     [provider]  mupirocin ointment (BACTROBAN) 2 % Apply to affected area on back bid 05/13/16   Einar Pheasant, MD  Naldemedine  Tosylate (SYMPROIC) 0.2 MG TABS Take 0.2 mg by mouth daily.    [provider]  nystatin [facility]) powder APPLY TOPICALLY 2 TIMES DAILY 07/06/18   Einar Pheasant, MD  pantoprazole (PROTONIX) 40 MG tablet TAKE 1 TABLET BY MOUTH TWICE A DAY. 08/13/17   Einar Pheasant, MD  polyethylene glycol powder (GLYCOLAX/MIRALAX) powder MIX 17 GRAMS AS MARKED ON BOTTLE TOP IN 8 OUNCES OF WATER AND DRINK ONCE A DAY AS DIRECTED. 04/06/18   Einar Pheasant, MD  Probiotic Product (ALIGN PO) Take by mouth daily.    [provider]  sodium chloride (OCEAN) 0.65 % nasal spray Place 1 spray into the nose as needed.    [provider]  triamcinolone cream (KENALOG) 0.1 %  01/23/17   [provider]    Allergies Lyrica [pregabalin]; Atarax [hydroxyzine]; Dicyclomine; Hydroxyzine hcl; Levaquin [levofloxacin]; Nucynta er [tapentadol hcl er]; Oxybutynin; Zoloft [sertraline hcl]; Biaxin [clarithromycin]; Sertraline; Sulfa antibiotics; Sulfasalazine; Tape; and Tapentadol  Family History  Problem Relation Age of Onset  . Heart disease Mother   . Stroke Mother   . Hypertension Mother   .  Heart disease Father        myocardial infarction age 22  . Breast cancer Neg Hx     Social History Social History   Tobacco Use  . Smoking status: Never Smoker  . Smokeless tobacco: Never Used  Substance Use Topics  . Alcohol use: No    Alcohol/week: 0.0 standard drinks  . Drug use: No    Review of Systems  Constitutional: Negative for fever. Eyes: Negative for visual changes. ENT: Negative for facial injury or neck injury Cardiovascular: Negative for chest injury. Respiratory: Negative for shortness of breath. Negative for chest wall injury. Gastrointestinal: Negative for abdominal pain or injury. Genitourinary: Negative for dysuria. Musculoskeletal: Negative for back injury, + R leg and hip pain Skin: Negative for laceration/abrasions. Neurological: Negative for head injury.  ____________________________________________   PHYSICAL EXAM:  VITAL SIGNS: ED Triage Vitals  Enc Vitals Group     BP 08/06/18 2137 (!) 189/66     Pulse Rate 08/06/18 2137 (!) 55     Resp 08/06/18 2137 19     Temp 08/06/18 2137 97.9 F (36.6 C)     Temp Source 08/06/18 2137 Oral     SpO2 08/06/18 2137 97 %     Weight 08/06/18 2138 185 lb (83.9 kg)     Height 08/06/18 2138 _0  (1.6 m)     Head Circumference --      Peak Flow --      Pain Score 08/06/18 2138 5     Pain Loc --      Pain Edu? --      Excl. in [place]? --    Full spinal precautions maintained throughout the trauma exam. Constitutional: Alert and oriented. No acute distress. Does not appear intoxicated. HEENT Head: Normocephalic and atraumatic. Face: No facial bony tenderness. Stable midface Ears: No hemotympanum bilaterally. No Battle sign Eyes: No eye injury. PERRL. No raccoon eyes Nose: Nontender. No epistaxis. No rhinorrhea Mouth/Throat: Mucous membranes are moist. No oropharyngeal blood. No dental injury. Airway patent without stridor. Normal voice. Neck: no C-collar in place. No midline c-spine tenderness.    Cardiovascular: Normal rate, regular rhythm. Normal and symmetric distal pulses are present in all extremities. Pulmonary/Chest: Chest wall is stable and nontender to palpation/compression. Normal respiratory effort. Breath sounds are normal. No crepitus.  Abdominal: Soft, nontender, non distended. Musculoskeletal: Tender to palpation over  the R mid femur with no obvious deformity. nontender with normal full range of motion in all other extremities. No deformities. No thoracic or lumbar midline spinal tenderness. Pelvis is stable. Skin: Skin is warm, dry and intact. No abrasions or contutions. Psychiatric: Speech and behavior are appropriate. Neurological: Normal speech and language. Moves all extremities to command. No gross focal neurologic deficits are appreciated.  Glascow Coma Score: 4 - Opens eyes on own 6 - Follows simple motor commands 5 - Alert and oriented GCS: 15  ____________________________________________   LABS (all labs ordered are listed, but only abnormal results are displayed)  Labs Reviewed  CBC  BASIC METABOLIC PANEL  PROTIME-INR  APTT  TYPE AND SCREEN   ____________________________________________  EKG  PND  ____________________________________________  RADIOLOGY  I have personally reviewed the images performed during this visit and I agree with the Radiologist's read.   Interpretation by Radiologist:  Dg Chest 1 View  Result Date: 08/06/2018 CLINICAL DATA:  Fall. EXAM: CHEST  1 VIEW COMPARISON:  07/18/2017 FINDINGS: Cardiomegaly. There may be a small loculated left lateral pleural effusion. No confluent airspace opacities. No acute bony abnormality. IMPRESSION: Cardiomegaly. Possible small loculated left lateral pleural effusion. Electronically Signed   By: Rolm Baptise M.D.   On: 08/06/2018 22:39   Dg Knee 2 Views Right  Result Date: 08/06/2018 CLINICAL DATA:  84 year old female with fall and right lower extremity pain. EXAM: RIGHT KNEE - 1-2  VIEW COMPARISON:  Right lower extremity radiograph dated 10/27/2010 FINDINGS: There is a total right knee arthroplasty. The arthroplasty components appear intact and in anatomic alignment. No evidence of arthroplasty loosening. There is a displaced spiral fracture of the distal femoral diaphysis with approximately 3 cm medial displacement of the distal fracture fragment. The bones are osteopenic. There is no dislocation. The soft tissues are unremarkable. IMPRESSION: 1. Displaced spiral fracture of the distal femoral diaphysis. 2. Hardware intact. Electronically Signed   By: Anner Crete M.D.   On: 08/06/2018 22:38   Dg Hip Unilat W Or Wo Pelvis 2-3 Views Right  Result Date: 08/06/2018 CLINICAL DATA:  Fall.  Right hip pain. EXAM: DG HIP (WITH OR WITHOUT PELVIS) 2-3V RIGHT COMPARISON:  CT 04/30/2018 FINDINGS: Changes of remote proximal right femoral fracture and repair. No acute fracture, subluxation or dislocation. Moderate degenerative changes in the hips bilaterally. Diffuse osteopenia. IMPRESSION: Remote right proximal femoral fracture and internal fixation. No acute bony abnormality. Electronically Signed   By: Rolm Baptise M.D.   On: 08/06/2018 22:38      ____________________________________________   PROCEDURES  Procedure(s) performed: None Procedures Critical Care performed:  None ____________________________________________   INITIAL IMPRESSION / ASSESSMENT AND PLAN / ED COURSE   84 y.o. female with a history of a previously broken right hip and right knee replacement as well as several chronic medical problems as listed below who presents for evaluation of fall and right leg pain.  X-ray consistent with a distal femur shaft spiral fracture with displacement.  Limb is vascularly intact. Dr. Rudene Christians has been paged.     _________________________ 10:56 PM on 08/06/2018 -----------------------------------------  Spoke with Dr. Rudene Christians who requested Hospitalist to admit the patient for  medical clearance and possible surgical repair tomorrow.    As part of my medical decision making, I reviewed the following data within the [place] notes reviewed and incorporated, Old chart reviewed, Radiograph reviewed , A consult was requested and obtained from this/these consultant(s) Orthopedics, Notes from prior ED visits and  [place] Controlled Substance Database    Pertinent labs & imaging results that were available during my care of the patient were reviewed by me and considered in my medical decision making (see chart for details).    ____________________________________________   FINAL CLINICAL IMPRESSION(S) / ED DIAGNOSES  Final diagnoses:  Fall, initial encounter  Closed displaced spiral fracture of shaft of right femur, initial encounter ([place])      NEW MEDICATIONS STARTED DURING THIS VISIT:  ED Discharge Orders    None       Note:  This document was prepared using Dragon voice recognition software and may include unintentional dictation errors.    Rudene Re, MD 08/06/18 [phone]

## 2018-08-06 NOTE — ED Triage Notes (Signed)
Pt cmes via [facility] from home after fall. Pt states she didn't lock her walker and went to grab it and missed and fell.  Pt c/o right upper hip to knee pain. Pt denies LOC or hitting head.  Pt has bruising noted under right knee and little swelling.  Pt also states she had just taken morphine, Neurontin, and hydrocodone.  Pt denies any dizziness or chest pain.

## 2018-08-06 NOTE — ED Notes (Signed)
Report given to [facility]

## 2018-08-06 NOTE — ED Notes (Signed)
Pt back from XRAY

## 2018-08-07 ENCOUNTER — Inpatient Hospital Stay: Payer: PPO

## 2018-08-07 ENCOUNTER — Inpatient Hospital Stay: Payer: PPO | Admitting: Anesthesiology

## 2018-08-07 ENCOUNTER — Encounter: Payer: Self-pay | Admitting: Anesthesiology

## 2018-08-07 ENCOUNTER — Encounter
Admission: EM | Disposition: A | Discharge status: Skilled Nursing Facility | Payer: Self-pay | Source: Home / Self Care | Attending: Internal Medicine

## 2018-08-07 HISTORY — PX: ORIF FEMUR FRACTURE: SHX2119

## 2018-08-07 LAB — BASIC METABOLIC PANEL
Anion gap: 9 (ref 5–15)
BUN: 23 mg/dL (ref 8–23)
CALCIUM: 9.5 mg/dL (ref 8.9–10.3)
CO2: 30 mmol/L (ref 22–32)
Chloride: 99 mmol/L (ref 98–111)
Creatinine, Ser: 0.84 mg/dL (ref 0.44–1.00)
GFR calc Af Amer: >60 mL/min (ref >60)
GFR calc non Af Amer: >60 mL/min (ref >60)
Glucose, Bld: 120 mg/dL — ABNORMAL HIGH (ref 70–99)
Potassium: 3.8 mmol/L (ref 3.5–5.1)
Sodium: 138 mmol/L (ref 135–145)

## 2018-08-07 LAB — CBC
HCT: 33.9 % — ABNORMAL LOW (ref 36.0–46.0)
Hemoglobin: 10.9 g/dL — ABNORMAL LOW (ref 12.0–15.0)
MCH: 32.2 pg (ref 26.0–34.0)
MCHC: 32.2 g/dL (ref 30.0–36.0)
MCV: 100.3 fL — ABNORMAL HIGH (ref 80.0–100.0)
Platelets: 174 10*3/uL (ref 150–400)
RBC: 3.38 MIL/uL — ABNORMAL LOW (ref 3.87–5.11)
RDW: 13.5 % (ref 11.5–15.5)
WBC: 11.1 10*3/uL — ABNORMAL HIGH (ref 4.0–10.5)
nRBC: 0 % (ref 0.0–0.2)

## 2018-08-07 LAB — GLUCOSE, CAPILLARY: Glucose-Capillary: 100 mg/dL — ABNORMAL HIGH (ref 70–99)

## 2018-08-07 LAB — MRSA PCR SCREENING: MRSA by PCR: NEGATIVE

## 2018-08-07 SURGERY — OPEN REDUCTION INTERNAL FIXATION (ORIF) DISTAL FEMUR FRACTURE
Anesthesia: Spinal | Site: Leg Upper | Laterality: Right

## 2018-08-07 MED ORDER — FENTANYL CITRATE (PF) 100 MCG/2ML IJ SOLN
INTRAMUSCULAR | Status: AC
  Filled 2018-08-07: qty 2

## 2018-08-07 MED ORDER — BISACODYL 5 MG PO TBEC: 5.0000 mg | DELAYED_RELEASE_TABLET | Freq: Every day | ORAL | Status: DC | PRN

## 2018-08-07 MED ORDER — METOCLOPRAMIDE HCL 10 MG PO TABS: 5.0000 mg | ORAL_TABLET | Freq: Three times a day (TID) | ORAL | Status: DC | PRN

## 2018-08-07 MED ORDER — GABAPENTIN 300 MG PO CAPS
600.0000 mg | ORAL_CAPSULE | Freq: Two times a day (BID) | ORAL | Status: DC
  Administered 2018-08-08 – 2018-08-10 (×5): 600 mg via ORAL
  Filled 2018-08-07 (×5): qty 2

## 2018-08-07 MED ORDER — AMLODIPINE BESYLATE 10 MG PO TABS
10.0000 mg | ORAL_TABLET | Freq: Every day | ORAL | Status: DC
  Administered 2018-08-08 – 2018-08-09 (×2): 10 mg via ORAL
  Filled 2018-08-07 (×2): qty 1

## 2018-08-07 MED ORDER — ZOLPIDEM TARTRATE 5 MG PO TABS: 5.0000 mg | ORAL_TABLET | Freq: Every evening | ORAL | Status: DC | PRN

## 2018-08-07 MED ORDER — ACETAMINOPHEN 650 MG RE SUPP: 650.0000 mg | Freq: Four times a day (QID) | RECTAL | Status: DC | PRN

## 2018-08-07 MED ORDER — ENOXAPARIN SODIUM 40 MG/0.4ML ~~LOC~~ SOLN
40.0000 mg | SUBCUTANEOUS | Status: DC
  Administered 2018-08-08 – 2018-08-10 (×3): 40 mg via SUBCUTANEOUS
  Filled 2018-08-07 (×3): qty 0.4

## 2018-08-07 MED ORDER — ROCURONIUM BROMIDE 100 MG/10ML IV SOLN
INTRAVENOUS | Status: DC | PRN
  Administered 2018-08-07: 35 mg via INTRAVENOUS
  Administered 2018-08-07: 5 mg via INTRAVENOUS

## 2018-08-07 MED ORDER — HYDROMORPHONE HCL 1 MG/ML IJ SOLN: 0.5000 mg | INTRAMUSCULAR | Status: DC | PRN

## 2018-08-07 MED ORDER — TRAZODONE HCL 50 MG PO TABS: 25.0000 mg | ORAL_TABLET | Freq: Every evening | ORAL | Status: DC | PRN

## 2018-08-07 MED ORDER — CEFAZOLIN SODIUM-DEXTROSE 2-4 GM/100ML-% IV SOLN
2.0000 g | Freq: Four times a day (QID) | INTRAVENOUS | Status: AC
  Administered 2018-08-08 (×3): 2 g via INTRAVENOUS
  Filled 2018-08-07 (×3): qty 100

## 2018-08-07 MED ORDER — ACETAMINOPHEN 500 MG PO TABS
1000.0000 mg | ORAL_TABLET | Freq: Four times a day (QID) | ORAL | Status: AC
  Administered 2018-08-08 (×4): 1000 mg via ORAL
  Filled 2018-08-07 (×4): qty 2

## 2018-08-07 MED ORDER — PHENYLEPHRINE HCL 10 MG/ML IJ SOLN
INTRAMUSCULAR | Status: DC | PRN
  Administered 2018-08-07 (×3): 100 ug via INTRAVENOUS

## 2018-08-07 MED ORDER — DULOXETINE HCL 60 MG PO CPEP
60.0000 mg | ORAL_CAPSULE | Freq: Every day | ORAL | Status: DC
  Administered 2018-08-08 – 2018-08-10 (×3): 60 mg via ORAL
  Filled 2018-08-07 (×4): qty 1

## 2018-08-07 MED ORDER — SODIUM CHLORIDE 0.9 % IV SOLN
INTRAVENOUS | Status: DC | PRN
  Administered 2018-08-07: 20 ug/min via INTRAVENOUS

## 2018-08-07 MED ORDER — ACETAMINOPHEN 325 MG PO TABS: 650.0000 mg | ORAL_TABLET | Freq: Four times a day (QID) | ORAL | Status: DC | PRN

## 2018-08-07 MED ORDER — SODIUM CHLORIDE 0.9 % IV SOLN
INTRAVENOUS | Status: DC
  Administered 2018-08-08 (×2): via INTRAVENOUS

## 2018-08-07 MED ORDER — EPHEDRINE SULFATE 50 MG/ML IJ SOLN
INTRAMUSCULAR | Status: DC | PRN
  Administered 2018-08-07: 10 mg via INTRAVENOUS
  Administered 2018-08-07 (×2): 5 mg via INTRAVENOUS

## 2018-08-07 MED ORDER — HYDROCODONE-ACETAMINOPHEN 5-325 MG PO TABS
1.0000 | ORAL_TABLET | ORAL | Status: DC | PRN
  Administered 2018-08-10 (×2): 2 via ORAL
  Filled 2018-08-07 (×2): qty 2

## 2018-08-07 MED ORDER — KETAMINE HCL 50 MG/ML IJ SOLN
INTRAMUSCULAR | Status: AC
  Filled 2018-08-07: qty 10

## 2018-08-07 MED ORDER — SUCCINYLCHOLINE CHLORIDE 20 MG/ML IJ SOLN
INTRAMUSCULAR | Status: DC | PRN
  Administered 2018-08-07: 80 mg via INTRAVENOUS

## 2018-08-07 MED ORDER — FENTANYL CITRATE (PF) 100 MCG/2ML IJ SOLN
25.0000 ug | INTRAMUSCULAR | Status: DC | PRN
  Administered 2018-08-07 (×4): 25 ug via INTRAVENOUS

## 2018-08-07 MED ORDER — BENAZEPRIL HCL 20 MG PO TABS
40.0000 mg | ORAL_TABLET | Freq: Every day | ORAL | Status: DC
  Administered 2018-08-08 – 2018-08-09 (×2): 40 mg via ORAL
  Filled 2018-08-07 (×4): qty 2

## 2018-08-07 MED ORDER — VITAMIN D 25 MCG (1000 UNIT) PO TABS
1000.0000 [IU] | ORAL_TABLET | Freq: Every morning | ORAL | Status: DC
  Administered 2018-08-08 – 2018-08-10 (×3): 1000 [IU] via ORAL
  Filled 2018-08-07 (×3): qty 1

## 2018-08-07 MED ORDER — PROPOFOL 10 MG/ML IV BOLUS
INTRAVENOUS | Status: DC | PRN
  Administered 2018-08-07: 80 mg via INTRAVENOUS

## 2018-08-07 MED ORDER — METOPROLOL SUCCINATE ER 50 MG PO TB24
100.0000 mg | ORAL_TABLET | Freq: Every day | ORAL | Status: DC
  Administered 2018-08-08 – 2018-08-10 (×3): 100 mg via ORAL
  Filled 2018-08-07 (×3): qty 2

## 2018-08-07 MED ORDER — DOCUSATE SODIUM 100 MG PO CAPS: 100.0000 mg | ORAL_CAPSULE | Freq: Two times a day (BID) | ORAL | Status: DC

## 2018-08-07 MED ORDER — CEFAZOLIN SODIUM-DEXTROSE 2-3 GM-%(50ML) IV SOLR
INTRAVENOUS | Status: DC | PRN
  Administered 2018-08-07: 2 g via INTRAVENOUS

## 2018-08-07 MED ORDER — CEFAZOLIN SODIUM-DEXTROSE 2-4 GM/100ML-% IV SOLN
2.0000 g | Freq: Once | INTRAVENOUS | Status: DC
  Filled 2018-08-07 (×2): qty 100

## 2018-08-07 MED ORDER — MIDAZOLAM HCL 2 MG/2ML IJ SOLN
INTRAMUSCULAR | Status: DC | PRN
  Administered 2018-08-07: 1 mg via INTRAVENOUS

## 2018-08-07 MED ORDER — ONDANSETRON HCL 4 MG PO TABS: 4.0000 mg | ORAL_TABLET | Freq: Four times a day (QID) | ORAL | Status: DC | PRN

## 2018-08-07 MED ORDER — MIDAZOLAM HCL 2 MG/2ML IJ SOLN
INTRAMUSCULAR | Status: AC
  Filled 2018-08-07: qty 2

## 2018-08-07 MED ORDER — METOCLOPRAMIDE HCL 5 MG/ML IJ SOLN: 5.0000 mg | Freq: Three times a day (TID) | INTRAMUSCULAR | Status: DC | PRN

## 2018-08-07 MED ORDER — KETAMINE HCL 10 MG/ML IJ SOLN
INTRAMUSCULAR | Status: DC | PRN
  Administered 2018-08-07: 20 mg via INTRAVENOUS

## 2018-08-07 MED ORDER — GLYCOPYRROLATE 0.2 MG/ML IJ SOLN
INTRAMUSCULAR | Status: DC | PRN
  Administered 2018-08-07 (×2): .2 mg via INTRAVENOUS

## 2018-08-07 MED ORDER — HYDROCHLOROTHIAZIDE 25 MG PO TABS
25.0000 mg | ORAL_TABLET | Freq: Every day | ORAL | Status: DC
  Administered 2018-08-08 – 2018-08-09 (×2): 25 mg via ORAL
  Filled 2018-08-07 (×2): qty 1

## 2018-08-07 MED ORDER — OXYCODONE HCL 5 MG PO TABS
5.0000 mg | ORAL_TABLET | ORAL | Status: DC | PRN
  Administered 2018-08-08 (×3): 5 mg via ORAL
  Administered 2018-08-09: 10 mg via ORAL
  Administered 2018-08-09: 5 mg via ORAL
  Filled 2018-08-07 (×3): qty 1
  Filled 2018-08-07: qty 2
  Filled 2018-08-07: qty 1

## 2018-08-07 MED ORDER — LEVOTHYROXINE SODIUM 100 MCG PO TABS
100.0000 ug | ORAL_TABLET | Freq: Every day | ORAL | Status: DC
  Administered 2018-08-08 – 2018-08-10 (×3): 100 ug via ORAL
  Filled 2018-08-07 (×3): qty 1

## 2018-08-07 MED ORDER — CALCIUM CARBONATE-VITAMIN D 500-200 MG-UNIT PO TABS
1.0000 | ORAL_TABLET | Freq: Every day | ORAL | Status: DC
  Administered 2018-08-08 – 2018-08-10 (×3): 1 via ORAL
  Filled 2018-08-07 (×3): qty 1

## 2018-08-07 MED ORDER — METHOCARBAMOL 1000 MG/10ML IJ SOLN
500.0000 mg | Freq: Four times a day (QID) | INTRAVENOUS | Status: DC | PRN
  Filled 2018-08-07: qty 5

## 2018-08-07 MED ORDER — FENTANYL CITRATE (PF) 100 MCG/2ML IJ SOLN
INTRAMUSCULAR | Status: AC
  Administered 2018-08-07: 25 ug via INTRAVENOUS
  Filled 2018-08-07: qty 2

## 2018-08-07 MED ORDER — LACTATED RINGERS IV SOLN
INTRAVENOUS | Status: DC | PRN
  Administered 2018-08-07: 18:00:00 via INTRAVENOUS

## 2018-08-07 MED ORDER — SUGAMMADEX SODIUM 200 MG/2ML IV SOLN
INTRAVENOUS | Status: DC | PRN
  Administered 2018-08-07: 150 mg via INTRAVENOUS

## 2018-08-07 MED ORDER — FENTANYL CITRATE (PF) 100 MCG/2ML IJ SOLN
INTRAMUSCULAR | Status: DC | PRN
  Administered 2018-08-07: 50 ug via INTRAVENOUS

## 2018-08-07 MED ORDER — ONDANSETRON HCL 4 MG/2ML IJ SOLN: 4.0000 mg | Freq: Once | INTRAMUSCULAR | Status: DC | PRN

## 2018-08-07 MED ORDER — DOCUSATE SODIUM 100 MG PO CAPS
100.0000 mg | ORAL_CAPSULE | Freq: Two times a day (BID) | ORAL | Status: DC
  Administered 2018-08-08 – 2018-08-10 (×5): 100 mg via ORAL
  Filled 2018-08-07 (×6): qty 1

## 2018-08-07 MED ORDER — MAGNESIUM HYDROXIDE 400 MG/5ML PO SUSP
30.0000 mL | Freq: Every day | ORAL | Status: DC | PRN
  Administered 2018-08-09 – 2018-08-10 (×2): 30 mL via ORAL
  Filled 2018-08-07 (×2): qty 30

## 2018-08-07 MED ORDER — METHOCARBAMOL 500 MG PO TABS: 500.0000 mg | ORAL_TABLET | Freq: Four times a day (QID) | ORAL | Status: DC | PRN

## 2018-08-07 MED ORDER — ONDANSETRON HCL 4 MG/2ML IJ SOLN
4.0000 mg | Freq: Four times a day (QID) | INTRAMUSCULAR | Status: DC | PRN
  Administered 2018-08-07: 4 mg via INTRAVENOUS

## 2018-08-07 MED ORDER — MORPHINE SULFATE ER 30 MG PO TBCR
60.0000 mg | EXTENDED_RELEASE_TABLET | Freq: Two times a day (BID) | ORAL | Status: DC
  Administered 2018-08-08 – 2018-08-10 (×5): 60 mg via ORAL
  Filled 2018-08-07 (×5): qty 2

## 2018-08-07 MED ORDER — DIPHENHYDRAMINE HCL 12.5 MG/5ML PO ELIX: 12.5000 mg | ORAL_SOLUTION | ORAL | Status: DC | PRN

## 2018-08-07 MED ORDER — MAGNESIUM CITRATE PO SOLN
1.0000 | Freq: Once | ORAL | Status: AC | PRN
  Administered 2018-08-10: 1 via ORAL
  Filled 2018-08-07 (×2): qty 296

## 2018-08-07 MED ORDER — ATORVASTATIN CALCIUM 20 MG PO TABS
20.0000 mg | ORAL_TABLET | Freq: Every day | ORAL | Status: DC
  Administered 2018-08-08 – 2018-08-09 (×2): 20 mg via ORAL
  Filled 2018-08-07 (×2): qty 1

## 2018-08-07 MED ORDER — PANTOPRAZOLE SODIUM 40 MG PO TBEC
40.0000 mg | DELAYED_RELEASE_TABLET | Freq: Two times a day (BID) | ORAL | Status: DC
  Administered 2018-08-08 – 2018-08-10 (×5): 40 mg via ORAL
  Filled 2018-08-07 (×5): qty 1

## 2018-08-07 MED ORDER — LIDOCAINE HCL (CARDIAC) PF 100 MG/5ML IV SOSY
PREFILLED_SYRINGE | INTRAVENOUS | Status: DC | PRN
  Administered 2018-08-07: 60 mg via INTRAVENOUS

## 2018-08-07 MED ORDER — NEOMYCIN-POLYMYXIN B GU 40-200000 IR SOLN
Status: DC | PRN
  Administered 2018-08-07: 2 mL

## 2018-08-07 SURGICAL SUPPLY — 53 items
BIT DRILL 4.3 (BIT) ×2 IMPLANT
BIT DRILL 4.3X300MM (BIT) IMPLANT
CABLE CERLAGE W/CRIMP 1.8 (Cable) ×1 IMPLANT
CANISTER SUCT 1200ML W/VALVE (MISCELLANEOUS) ×2 IMPLANT
CAP LOCK NCB (Cap) ×6 IMPLANT
CHLORAPREP W/TINT 26ML (MISCELLANEOUS) ×2 IMPLANT
COVER LIGHT HANDLE STERIS (MISCELLANEOUS) ×1 IMPLANT
COVER WAND RF STERILE (DRAPES) ×1 IMPLANT
DRAPE C-ARM XRAY 36X54 (DRAPES) ×2 IMPLANT
DRAPE C-ARMOR (DRAPES) ×2 IMPLANT
DRILL BIT 4.3 (BIT) ×2
ELECT REM PT RETURN 9FT ADLT (ELECTROSURGICAL) ×2
ELECTRODE REM PT RTRN 9FT ADLT (ELECTROSURGICAL) ×1 IMPLANT
GAUZE PETRO XEROFOAM 1X8 (MISCELLANEOUS) ×2 IMPLANT
GAUZE SPONGE 4X4 12PLY STRL (GAUZE/BANDAGES/DRESSINGS) ×2 IMPLANT
GLOVE BIOGEL PI IND STRL 9 (GLOVE) ×1 IMPLANT
GLOVE BIOGEL PI INDICATOR 9 (GLOVE) ×1
GLOVE SURG SYN 9.0  PF PI (GLOVE) ×1
GLOVE SURG SYN 9.0 PF PI (GLOVE) ×1 IMPLANT
GOWN SRG 2XL LVL 4 RGLN SLV (GOWNS) ×1 IMPLANT
GOWN STRL NON-REIN 2XL LVL4 (GOWNS) ×2
GOWN STRL REUS W/ TWL LRG LVL3 (GOWN DISPOSABLE) ×1 IMPLANT
GOWN STRL REUS W/TWL LRG LVL3 (GOWN DISPOSABLE) ×4
GRAFT MIIG INJECTABLE 15CC (Orthopedic Implant) IMPLANT
HEMOVAC 400ML (MISCELLANEOUS)
K-WIRE 2.0 (WIRE) ×4
K-WIRE FXSTD 280X2XNS SS (WIRE) ×2
KIT DRAIN HEMOVAC JP 7FR 400ML (MISCELLANEOUS) ×1 IMPLANT
KIT TURNOVER KIT A (KITS) ×2 IMPLANT
KWIRE FXSTD 280X2XNS SS (WIRE) IMPLANT
MAT ABSORB  FLUID 56X50 GRAY (MISCELLANEOUS) ×1
MAT ABSORB FLUID 56X50 GRAY (MISCELLANEOUS) ×1 IMPLANT
MIIG 15CC (Orthopedic Implant) ×2 IMPLANT
NDL FILTER BLUNT 18X1 1/2 (NEEDLE) ×1 IMPLANT
NEEDLE FILTER BLUNT 18X 1/2SAF (NEEDLE) ×1
NEEDLE FILTER BLUNT 18X1 1/2 (NEEDLE) ×1 IMPLANT
NS IRRIG 500ML POUR BTL (IV SOLUTION) ×2 IMPLANT
PACK TOTAL KNEE (MISCELLANEOUS) ×2 IMPLANT
PLATE FEMUR DISTAL NCB 5H (Plate) ×1 IMPLANT
SCALPEL PROTECTED #10 DISP (BLADE) ×4 IMPLANT
SCREW 5.0 32MM (Screw) ×1 IMPLANT
SCREW CORT NCB SELFTAP 5.0X50 (Screw) ×2 IMPLANT
SCREW CORTICAL NCB 5.0X65 (Screw) ×1 IMPLANT
SCREW NCB 5.0X28MM (Screw) ×1 IMPLANT
SCREW NCB 5.0X34MM (Screw) ×1 IMPLANT
SCREW NCB 5.0X46MM (Screw) ×2 IMPLANT
SCREW NCB 5.0X55MM (Screw) ×2 IMPLANT
STAPLER SKIN PROX 35W (STAPLE) ×2 IMPLANT
SUT VIC AB 0 CT1 36 (SUTURE) ×4 IMPLANT
SUT VIC AB 2-0 CT1 27 (SUTURE) ×4
SUT VIC AB 2-0 CT1 TAPERPNT 27 (SUTURE) ×2 IMPLANT
SYR 5ML LL (SYRINGE) ×2 IMPLANT
TAPE MICROFOAM 4IN (TAPE) ×1 IMPLANT

## 2018-08-07 NOTE — Transfer of Care (Signed)
Immediate Anesthesia Transfer of Care Note  Patient: Kimberly Roy  Procedure(s) Performed: OPEN REDUCTION INTERNAL FIXATION (ORIF) DISTAL FEMUR FRACTURE (Right Leg Upper)  Patient Location: PACU  Anesthesia Type:General  Level of Consciousness: awake  Airway & Oxygen Therapy: Patient Spontanous Breathing and Patient connected to face mask oxygen  Post-op Assessment: Report given to RN and Post -op Vital signs reviewed and stable  Post vital signs: Reviewed and stable  Last Vitals:  Vitals Value Taken Time  BP    Temp    Pulse 85 08/07/2018  8:52 PM  Resp 18 08/07/2018  8:52 PM  SpO2 100 % 08/07/2018  8:52 PM  Vitals shown include unvalidated device data.  Last Pain:  Vitals:   08/07/18 1717  TempSrc:   PainSc: 10-Worst pain ever         Complications: No apparent anesthesia complications

## 2018-08-07 NOTE — Op Note (Signed)
08/07/2018  8:52 PM  PATIENT:  Kimberly Roy  84 y.o. female  PRE-OPERATIVE DIAGNOSIS:  right distal femur fracture, periprosthetic  POST-OPERATIVE DIAGNOSIS:  right distal femur fracture periprosthetic  PROCEDURE:  Procedure(s): OPEN REDUCTION INTERNAL FIXATION (ORIF) DISTAL FEMUR FRACTURE (Right)  SURGEON: Laurene Footman, MD  ASSISTANTS: None  ANESTHESIA:   general  EBL:  Total I/O In: -  Out: 200 [Blood:200]  BLOOD ADMINISTERED:none  DRAINS: none   LOCAL MEDICATIONS USED:  NONE  SPECIMEN:  No Specimen  DISPOSITION OF SPECIMEN:  N/A  COUNTS:  YES  TOURNIQUET:  * No tourniquets in log *  IMPLANTS: [employer] distal NCB 5 hole plate with multiple screws cerclage wire and calcium sulfate graft  DICTATION: .Dragon Dictation patient was brought to the operating room and after adequate general anesthesia was obtained the right leg was prepped and draped in the usual sterile manner.  C arm was used to assess alignment.  Plate length was determined at this time a distal incision was made after appropriate patient identification and timeout procedure were completed.  A distal incision made over the lateral femur with the incision down through the skin and splitting the IT band was carried out followed by incising the capsule and evacuating hematoma.  The fracture was somewhat comminuted and quite displaced.  The incision was extended proximally enough that the guide for the distal femoral plate could have attachments for percutaneous screws more proximally.  It was very difficult to get anatomic alignment of the fracture and subsequently a cerclage wire was used to reapproximate the fragments getting optimal alignment.  This was done with the plate in place.  Proximal nonlocking cortical screws were placed through the guide drilling measuring and placing the nonlocking cortical screws the cerclage wire was placed subsequent to this to help get near anatomic alignment with just minimal  recurvatum and slight valgus deformity.  The distal locking screw holes were all filled using standard technique drilling and then placing the screws followed by the locking caps to make them locking screws with 1 more proximal screw crossing the fracture site to get some bone compression at the fracture actual fracture site.  At this point the wounds were thoroughly irrigated and does insufflate graft was injected into the fracture site to aid in bone healing.  The wound was closed with a running #1 Vicryl for the IT band and 2-0 Vicryl subcutaneously.  Skin staples were used additionally a clamp had been used to try to help with alignment placed through on the distal femur medial lateral and so small stab incision been made medially this was also closed with staples and clot covered with Xeroform 4 x 4's ABD and tape  PLAN OF CARE: Continue as inpatient  PATIENT DISPOSITION:  PACU - hemodynamically stable.

## 2018-08-07 NOTE — Consult Note (Signed)
Patient is a 84 year old with history of prior right hip fracture and right total knee suffered a fall last night at home.  She has a periprosthetic fracture of the right distal femur.  She was unable to bear weight was brought to the emergency room and found to have the fracture.  On exam she has swelling to the distal thigh and holds the leg in a flexed position to be comfortable.  Skin is intact with prior midline skin incision from the total knee and lateral incision more proximally at the hip  She is able flex extend her toes and has palpable pulses sensation intact.  Impression is periprosthetic distal femur fracture with more proximal femoral hardware  Plan: ORIF with subcutaneous plate technique she will be partial weightbearing postop.

## 2018-08-07 NOTE — Anesthesia Post-op Follow-up Note (Signed)
Anesthesia QCDR form completed.        

## 2018-08-07 NOTE — ED Notes (Signed)
Pt up to floor by EDT per charge nurse

## 2018-08-07 NOTE — Clinical Social Work Placement (Signed)
   CLINICAL SOCIAL WORK PLACEMENT  NOTE  Date:  08/07/2018  Patient Details  Name: Kimberly Roy MRN: [id number] Date of Birth: [birth year]  Clinical Social Work is seeking post-discharge placement for this patient at the [place] level of care (*CSW will initial, date and re-position this form in  chart as items are completed):  Yes   Patient/family provided with [facility] Work Department's list of facilities offering this level of care within the geographic area requested by the patient (or if unable, by the patient's family).  Yes   Patient/family informed of their freedom to choose among providers that offer the needed level of care, that participate in Medicare, Medicaid or managed care program needed by the patient, have an available bed and are willing to accept the patient.  Yes   Patient/family informed of [facility]'s ownership interest in [facility] and [facility], as well as of the fact that they are under no obligation to receive care at these facilities.  PASRR submitted to EDS on       PASRR number received on       Existing PASRR number confirmed on 08/07/18     FL2 transmitted to all facilities in geographic area requested by pt/family on 08/07/18     FL2 transmitted to all facilities within larger geographic area on       Patient informed that his/her managed care company has contracts with or will negotiate with certain facilities, including the following:            Patient/family informed of bed offers received.  Patient chooses bed at       Physician recommends and patient chooses bed at      Patient to be transferred to   on  .  Patient to be transferred to facility by       Patient family notified on   of transfer.  Name of family member notified:        PHYSICIAN       Additional Comment:    _______________________________________________ Ellen Goris, Veronia Beets, LCSW 08/07/2018, 10:09 AM

## 2018-08-07 NOTE — [place] FL2 (Signed)
[place] LEVEL OF CARE SCREENING TOOL     IDENTIFICATION  Patient Name: Kimberly Roy Birthdate: 27-Oct-1930 Sex: female Admission Date (Current Location): 08/06/2018  [place] and [place] Number:  Engineering geologist and Address:  [facility], [address] [place],  [address]      Provider Number: [id number]  Attending Physician Name and Address:  Dustin Flock, MD  Relative Name and Phone Number:       Current Level of Care: Hospital Recommended Level of Care: [place] Prior Approval Number:    Date Approved/Denied:   PASRR Number: [id number])  Discharge Plan: SNF    Current Diagnoses: Patient Active Problem List   Diagnosis Date Noted  . Femur fracture ([place]) 08/06/2018  . Superior mesenteric artery stenosis ([place]) 07/15/2018  . Right shoulder pain 05/16/2017  . Chronic venous insufficiency 02/10/2017  . Lymphedema 02/10/2017  . Neuropathy 12/25/2016  . Anemia due to blood loss, chronic 08/07/2016  . GERD (gastroesophageal reflux disease) 12/10/2015  . Finger pain 11/13/2015  . Carotid artery calcification 09/26/2015  . External nasal lesion 09/25/2015  . Muscle cramps 09/01/2015  . Excessive sweating 07/30/2015  . Headache 07/16/2015  . Groin pain 05/14/2015  . Muscle twitching 03/05/2015  . Leg cramps 02/11/2015  . Back pain 02/11/2015  . Abdominal pain 02/11/2015  . Acute cystitis without hematuria 12/12/2014  . Left elbow pain 11/30/2014  . Health care maintenance 11/30/2014  . Osteoporosis 10/19/2014  . Rectal bleeding 10/19/2014  . Neck pain 09/03/2014  . Unsteady gait 09/03/2014  . Nocturia 09/03/2014  . Dysphagia 06/01/2014  . Nasal dryness 06/01/2014  . Stress 06/01/2014  . Fatigue 02/27/2014  . Pre-op evaluation 10/12/2013  . Hoarseness 08/14/2013  . Leg swelling 01/24/2013  . CHF (congestive heart failure) ([place]) 12/29/2012  . Cough 12/29/2012  . OSA (obstructive sleep  apnea) 07/09/2012  . Osteoarthritis 07/04/2012  . Anemia 07/04/2012  . Chronic constipation 07/04/2012  . Pulmonary hypertension ([place]) 07/04/2012  . Pulmonary nodules 07/04/2012  . Hypertension 07/04/2012  . Hyperlipidemia 07/04/2012  . Hypothyroidism 07/04/2012    Orientation RESPIRATION BLADDER Height & Weight     Self, Time, Situation, Place  O2(3 Liters Oxygen. ) Continent Weight: 186 lb (84.4 kg) Height:  _0  (160 cm)  BEHAVIORAL SYMPTOMS/MOOD NEUROLOGICAL BOWEL NUTRITION STATUS      Continent Diet(Diet: NPO for surgery to be advanced. )  AMBULATORY STATUS COMMUNICATION OF NEEDS Skin   Extensive Assist Verbally Surgical wounds                       Personal Care Assistance Level of Assistance  Bathing, Feeding, Dressing Bathing Assistance: Limited assistance Feeding assistance: Independent Dressing Assistance: Limited assistance     Functional Limitations Info  Sight, Hearing, Speech Sight Info: Adequate Hearing Info: Impaired Speech Info: Adequate    SPECIAL CARE FACTORS FREQUENCY  PT (By licensed PT), OT (By licensed OT)     PT Frequency: (5) OT Frequency: (5)            Contractures      Additional Factors Info  Code Status, Allergies Code Status Info: (Full Code. ) Allergies Info: (Lyrica Pregabalin, Atarax Hydroxyzine, Dicyclomine, Hydroxyzine Hcl, Levaquin Levofloxacin, Nucynta Er Tapentadol Hcl Er, Oxybutynin, Zoloft Sertraline Hcl, Biaxin Clarithromycin, Sertraline, Sulfa Antibiotics, Sulfasalazine, Tape, Tapentadol)           Current Medications (08/07/2018):  This is the current hospital active medication list Current  Facility-Administered Medications  Medication Dose Route Frequency Provider Last Rate Last Dose  . acetaminophen (TYLENOL) tablet 650 mg  650 mg Oral Q6H PRN Amelia Jo, MD       Or  . acetaminophen (TYLENOL) suppository 650 mg  650 mg Rectal Q6H PRN Amelia Jo, MD      . amLODipine (NORVASC) tablet 10 mg  10 mg  Oral Daily Amelia Jo, MD      . atorvastatin (LIPITOR) tablet 20 mg  20 mg Oral QHS Amelia Jo, MD      . benazepril (LOTENSIN) tablet 40 mg  40 mg Oral Daily Amelia Jo, MD      . bisacodyl (DULCOLAX) EC tablet 5 mg  5 mg Oral Daily PRN Amelia Jo, MD      . calcium-vitamin D (OSCAL WITH D) 500-200 MG-UNIT per tablet 1 tablet  1 tablet Oral Daily Amelia Jo, MD      . ceFAZolin (ANCEF) IVPB 2g/100 mL premix  2 g Intravenous Once Hessie Knows, MD      . cholecalciferol (VITAMIN D3) tablet 1,000 Units  1,000 Units Oral q morning - 10a Amelia Jo, MD      . docusate sodium (COLACE) capsule 100 mg  100 mg Oral BID Amelia Jo, MD      . DULoxetine (CYMBALTA) DR capsule 60 mg  60 mg Oral Daily Amelia Jo, MD      . gabapentin (NEURONTIN) capsule 600 mg  600 mg Oral BID Amelia Jo, MD      . hydrochlorothiazide (HYDRODIURIL) tablet 25 mg  25 mg Oral Daily Amelia Jo, MD      . HYDROcodone-acetaminophen (NORCO/VICODIN) 5-325 MG per tablet 1-2 tablet  1-2 tablet Oral Q4H PRN Amelia Jo, MD      . HYDROmorphone (DILAUDID) injection 0.5 mg  0.5 mg Intravenous Q4H PRN Amelia Jo, MD      . levothyroxine (SYNTHROID, LEVOTHROID) tablet 100 mcg  100 mcg Oral Daily Amelia Jo, MD      . metoprolol succinate (TOPROL-XL) 24 hr tablet 100 mg  100 mg Oral Daily Amelia Jo, MD      . morphine (MS CONTIN) 12 hr tablet 60 mg  60 mg Oral BID Amelia Jo, MD      . morphine 4 MG/ML injection 4 mg  4 mg Intravenous Q2H PRN Rudene Re, MD   4 mg at 08/07/18 0850  . ondansetron (ZOFRAN) tablet 4 mg  4 mg Oral Q6H PRN Amelia Jo, MD       Or  . ondansetron [facility]) injection 4 mg  4 mg Intravenous Q6H PRN Amelia Jo, MD      . pantoprazole (PROTONIX) EC tablet 40 mg  40 mg Oral BID Amelia Jo, MD      . traZODone (DESYREL) tablet 25 mg  25 mg Oral QHS PRN Amelia Jo, MD         Discharge Medications: Please see discharge summary for a list of discharge  medications.  Relevant Imaging Results:  Relevant Lab Results:   Additional Information (SSN: [id number])  Pariss Hommes, Veronia Beets, LCSW

## 2018-08-07 NOTE — Anesthesia Procedure Notes (Signed)
Procedure Name: Intubation Date/Time: 08/07/2018 6:40 PM Performed by: Allean Found, CRNA Pre-anesthesia Checklist: Patient identified, Patient being monitored, Timeout performed, Emergency Drugs available and Suction available Patient Re-evaluated:Patient Re-evaluated prior to induction Oxygen Delivery Method: Circle system utilized Preoxygenation: Pre-oxygenation with 100% oxygen Induction Type: IV induction Ventilation: Mask ventilation without difficulty Laryngoscope Size: Mac and 3 Grade View: Grade III Tube type: Oral Tube size: 7.0 mm Number of attempts: 1 Airway Equipment and Method: Stylet Placement Confirmation: ETT inserted through vocal cords under direct vision,  positive ETCO2 and breath sounds checked- equal and bilateral Secured at: 22 cm Tube secured with: Tape Dental Injury: Teeth and Oropharynx as per pre-operative assessment  Difficulty Due To: Difficulty was unanticipated

## 2018-08-07 NOTE — Clinical Social Work Note (Signed)
Clinical Social Work Assessment  Patient Details  Name: Kimberly Roy MRN: [id number] Date of Birth: 1930-11-22  Date of referral:  08/07/18               Reason for consult:  Facility Placement                Permission sought to share information with:  Chartered certified accountant granted to share information::  Yes, Verbal Permission Granted  Name::      [place]::   [place]   Relationship::     Contact Information:     Housing/Transportation Living arrangements for the past 2 months:  [place] of Information:  Patient Patient Interpreter Needed:  None Criminal Activity/Legal Involvement Pertinent to Current Situation/Hospitalization:  No - Comment as needed Significant Relationships:  Adult Children, Spouse, Other Family Members Lives with:  Adult Children, Spouse Do you feel safe going back to the place where you live?  Yes Need for family participation in patient care:  Yes (Comment)  Care giving concerns:  Patient lives in [place] with her husband Therwell and 81 y.o son Shanon Brow.    Social Worker assessment / plan:  Holiday representative (CSW) reviewed chart and noted that patient has a femur fracture. Patient will have surgery today. PT is pending. CSW met with patient prior to surgery today and her niece Santiago Glad was at bedside. Patient was alert and oriented X4 and was laying in the bed. CSW introduced self and explained role of CSW department. Per patient she lives in [place] with her son and husband. Patient reported that her husband and her daughter [name] share [place] for her. CSW explained that PT will evaluate patient after surgery and make a recommendation of home health or SNF. Patient prefers to go home but is open to SNF if needed. Per patient she went to [facility] in 2012. Patient prefers [facility] or [facility] if SNF is needed. CSW explained that Health Team will have to approve SNF and patient will have a co-pay of $10-$20  per day at [facility]. Patient verbalized her understanding. Patient stated that she prefers [facility] for home health if she goes home. FL2 complete and faxed out. RN case manager aware of above. CSW will continue to follow and assist as needed.    Employment status:  Retired Nurse, adult PT Recommendations:  Not assessed at this time Information / Referral to community resources:  [facility](SNF vs. [facility] [place] )  Patient/Family's Response to care:  Patient prefers to go home but is open to SNF if needed.   Patient/Family's Understanding of and Emotional Response to Diagnosis, Current Treatment, and Prognosis:  Patient was very pleasant and thanked CSW for assistance.   Emotional Assessment Appearance:  Appears stated age Attitude/Demeanor/Rapport:    Affect (typically observed):  Accepting, Adaptable, Pleasant Orientation:  Oriented to Self, Oriented to Place, Oriented to  Time, Oriented to Situation Alcohol / Substance use:  Not Applicable Psych involvement (Current and /or in the community):  No (Comment)  Discharge Needs  Concerns to be addressed:  Discharge Planning Concerns Readmission within the last 30 days:  No Current discharge risk:  Dependent with Mobility Barriers to Discharge:  Continued Medical Work up   [employer], Veronia Beets, LCSW 08/07/2018, 10:10 AM

## 2018-08-07 NOTE — ED Notes (Signed)
Admitting MD at bedside.

## 2018-08-07 NOTE — H&P (Signed)
[facility] [place] NAME: Kimberly Roy    MR#:  [id number]  DATE OF BIRTH:  [birth year]  DATE OF ADMISSION:  08/06/2018  PRIMARY CARE PHYSICIAN: Einar Pheasant, MD   REQUESTING/REFERRING PHYSICIAN:   CHIEF COMPLAINT:   Chief Complaint  Patient presents with  . Fall    HISTORY OF PRESENT ILLNESS: Kimberly Roy  is a 84 y.o. female with a known history of diastolic CHF, hypertension, hyperlipidemia, pulmonary hypertension, obstructive sleep apnea on 2 L oxygen at night.  Patient underwent right hip and left knee replacement surgeries in the past, without complications. She was brought to emergency room for right lower extremity pain status post mechanical fall, at home.  Patient has severe, sharp pain at the right femur area and she is unable to ambulate.  There was no head trauma or loss of consciousness, no bleeding.  No chest pain, no shortness of breath.  Patient is not on any blood thinners. Patient is usually able to ambulate around the house, without chest pain or shortness of breath.  Left knee pain limits her walks to less than 20 feet. Right knee x-ray confirms displaced spiral fracture of the distal femoral diaphysis. Blood test done emergency room, including CBC and CMP are grossly unremarkable. No acute cardiopulmonary abnormalities per chest x-ray. EKG shows sinus bradycardia with heart rate at 51 bpm, no acute ischemic changes.  Most recent echocardiogram, 2 months ago showed ejection fraction at 55%. Patient is admitted for further evaluation and treatment.   PAST MEDICAL HISTORY:   Past Medical History:  Diagnosis Date  . Anemia   . Anxiety   . Chest pain   . CHF (congestive heart failure) ([place])   . Constipation   . DDD (degenerative disc disease), cervical   . Depression   . DVT (deep venous thrombosis) ([place])   . Dysphonia   . Dyspnea   . Fatty liver   . Fatty liver   . Headache   . Hyperlipidemia   . Hyperpiesia    . Hypertension   . Hypothyroidism   . Interstitial cystitis   . Left ventricular dysfunction   . Lymphedema   . Nephrolithiasis   . Obstructive sleep apnea   . Osteoarthritis    knees/cervical and lumbar spine  . Pulmonary hypertension ([place])   . Pulmonary nodules    followed by Dr Raul Del  . Pure hypercholesterolemia   . Renal cyst    right    PAST SURGICAL HISTORY:  Past Surgical History:  Procedure Laterality Date  . ABDOMINAL HYSTERECTOMY     ovaries left in place  . APPENDECTOMY    . Back Surgeries    . BREAST REDUCTION SURGERY     3/99  . CARDIAC CATHETERIZATION    . cataracts Bilateral   . CERVICAL SPINE SURGERY    . ESOPHAGEAL MANOMETRY N/A 08/02/2015   Procedure: ESOPHAGEAL MANOMETRY (EM);  Surgeon: Josefine Class, MD;  Location: [facility] ENDOSCOPY;  Service: Endoscopy;  Laterality: N/A;  . ESOPHAGOGASTRODUODENOSCOPY N/A 02/27/2015   Procedure: ESOPHAGOGASTRODUODENOSCOPY (EGD);  Surgeon: Hulen Luster, MD;  Location: [facility] ENDOSCOPY;  Service: Gastroenterology;  Laterality: N/A;  . EXCISIONAL HEMORRHOIDECTOMY    . HEMORRHOID SURGERY    . HIP SURGERY  2013   Right hip surgery  . KNEE ARTHROSCOPY     left and right  . REDUCTION MAMMAPLASTY Bilateral YRS AGO  . REPLACEMENT TOTAL KNEE Bilateral   . rotator cuff surgery  blilateral  . TONSILECTOMY/ADENOIDECTOMY WITH MYRINGOTOMY      SOCIAL HISTORY:  Social History   Tobacco Use  . Smoking status: Never Smoker  . Smokeless tobacco: Never Used  Substance Use Topics  . Alcohol use: No    Alcohol/week: 0.0 standard drinks    FAMILY HISTORY:  Family History  Problem Relation Age of Onset  . Heart disease Mother   . Stroke Mother   . Hypertension Mother   . Heart disease Father        myocardial infarction age 30  . Breast cancer Neg Hx     DRUG ALLERGIES:  Allergies  Allergen Reactions  . Lyrica [Pregabalin] Swelling  . Atarax [Hydroxyzine]     jittery  . Dicyclomine Other (See Comments)     Abdominal bloating   . Hydroxyzine Hcl     jittery  . Levaquin [Levofloxacin] Swelling  . Nucynta Er [Tapentadol Hcl Er] Other (See Comments)    Severe constipation   . Oxybutynin Other (See Comments)    Blurred vision  . Zoloft [Sertraline Hcl]     Severe headache  . Biaxin [Clarithromycin] Other (See Comments) and Rash    Pt does not remember Pt does not remember  . Sertraline Nausea And Vomiting    Severe headache Severe headache Other reaction(s): Headache Severe headache  . Sulfa Antibiotics Rash    Pt does not remember  . Sulfasalazine Rash    Pt does not remember  . Tape Rash    [place] - redness  . Tapentadol Other (See Comments) and Rash    _0     REVIEW OF SYSTEMS:   CONSTITUTIONAL: No fever, fatigue or weakness.  EYES: No changes in vision.  EARS, NOSE, AND THROAT: No tinnitus or ear pain.  RESPIRATORY: No cough, shortness of breath, wheezing or hemoptysis.  CARDIOVASCULAR: No chest pain, orthopnea, edema.  GASTROINTESTINAL: No nausea, vomiting, diarrhea or abdominal pain.  GENITOURINARY: No dysuria, hematuria.  ENDOCRINE: No polyuria, nocturia. HEMATOLOGY: No bleeding. SKIN: No rash or lesion. MUSCULOSKELETAL: Positive for right lower extremity pain secondary to femur fracture.   NEUROLOGIC: No focal weakness.  PSYCHIATRY: No anxiety or depression.   MEDICATIONS AT HOME:  Prior to Admission medications   Medication Sig Start Date End Date Taking? Authorizing Provider  aluminum-magnesium hydroxide-simethicone (MAALOX) 500-938-18 MG/5ML SUSP Take 15 mLs by mouth 4 (four) times daily -  before meals and at bedtime.    Yes [provider]  amLODipine (NORVASC) 10 MG tablet TAKE 1 TABLET BY MOUTH DAILY 06/22/18  Yes Einar Pheasant, MD  atorvastatin (LIPITOR) 20 MG tablet TAKE ONE TABLET BY MOUTH AT BEDTIME 06/22/18  Yes Einar Pheasant, MD   benazepril (LOTENSIN) 40 MG tablet TAKE 1 TABLET BY MOUTH DAILY 06/22/18  Yes Einar Pheasant, MD  calcium citrate-vitamin D (CITRACAL+D) 315-200 MG-UNIT tablet Take 1 tablet by mouth daily.   Yes [provider]  Cholecalciferol (VITAMIN D3) 1000 UNITS CAPS Take by mouth.   Yes [provider]  DULoxetine (CYMBALTA) 60 MG capsule TAKE 1 CAPSULE BY MOUTH EVERY DAY. 12/25/16  Yes Einar Pheasant, MD  gabapentin (NEURONTIN) 300 MG capsule Take 600 mg by mouth 2 (two) times daily.    Yes [provider]  hydrochlorothiazide (HYDRODIURIL) 25 MG tablet TAKE 1 TABLET BY MOUTH EVERY DAY. 07/18/17  Yes Einar Pheasant, MD  HYDROcodone-acetaminophen (NORCO) 10-325 MG per tablet Take 1 tablet by mouth 3 (three) times daily.  Yes [provider]  levothyroxine (SYNTHROID, LEVOTHROID) 100 MCG tablet Take 1 tablet (100 mcg total) by mouth daily. 05/05/18  Yes Einar Pheasant, MD  magnesium oxide (MAG-OX) 400 MG tablet Take 1 tablet (400 mg total) by mouth daily. 02/11/15  Yes Einar Pheasant, MD  metoprolol succinate (TOPROL-XL) 100 MG 24 hr tablet TAKE 1 TABLET BY MOUTH TWICE A DAY WITH OR IMMEDIATELY FOLLOWING A MEAL Patient taking differently: No sig reported 06/02/18  Yes Einar Pheasant, MD  morphine (MS CONTIN) 60 MG 12 hr tablet Take 60 mg by mouth 2 (two) times daily.    Yes [provider]  Naldemedine Tosylate (SYMPROIC) 0.2 MG TABS Take 0.2 mg by mouth daily.   Yes [provider]  pantoprazole (PROTONIX) 40 MG tablet TAKE 1 TABLET BY MOUTH TWICE A DAY. 08/13/17  Yes Einar Pheasant, MD  senna (SENOKOT) 8.6 MG TABS tablet Take 1 tablet by mouth 2 (two) times daily.   Yes [provider]  Simethicone (GAS-X PO) Take 2 tablets by mouth 2 (two) times daily.   Yes [provider]  busPIRone (BUSPAR) 10 MG tablet  03/31/18   [provider]  Calcium-Magnesium-Vitamin D 600-40-500 MG-MG-UNIT TB24 Take 630 mg by mouth 4 (four)  times daily.    [provider]  Casanthranol-Docusate Sodium 30-100 MG CAPS Take 1 capsule by mouth daily.     [provider]  cephALEXin (KEFLEX) 250 MG capsule Take 250 mg by mouth daily.  01/23/17   [provider]  diclofenac sodium (VOLTAREN) 1 % GEL Apply topically. 10/16/17 10/16/18  [provider]  FLUoxetine (PROZAC) 20 MG tablet  08/12/17   [provider]  fluticasone Asencion Islam) 50 MCG/ACT nasal spray  03/19/18   [provider]  Inulin (FIBER CHOICE PO) Take 2 tablets by mouth daily. Chew tablets    [provider]  mupirocin ointment (BACTROBAN) 2 % Apply to affected area on back bid Patient not taking: Reported on 08/06/2018 05/13/16   Einar Pheasant, MD  nystatin [facility]) powder APPLY TOPICALLY 2 TIMES DAILY Patient not taking: Reported on 08/06/2018 07/06/18   Einar Pheasant, MD  polyethylene glycol powder (GLYCOLAX/MIRALAX) powder MIX 17 GRAMS AS MARKED ON BOTTLE TOP IN 8 OUNCES OF WATER AND DRINK ONCE A DAY AS DIRECTED. Patient not taking: Reported on 08/06/2018 04/06/18   Einar Pheasant, MD  Probiotic Product (ALIGN PO) Take by mouth daily.    [provider]  sodium chloride (OCEAN) 0.65 % nasal spray Place 1 spray into the nose as needed.    [provider]  triamcinolone cream (KENALOG) 0.1 %  01/23/17   [provider]      PHYSICAL EXAMINATION:   VITAL SIGNS: Blood pressure (!) 180/60, pulse (!) 52, temperature 97.9 F (36.6 C), temperature source Oral, resp. rate 19, height _0  (1.6 m), weight 83.9 kg, SpO2 94 %.  GENERAL:  84 y.o.-year-old patient lying in the bed with no acute distress.  EYES: Pupils equal, round, reactive to light and accommodation. No scleral icterus. Extraocular muscles intact.  HEENT: Head atraumatic, normocephalic. Oropharynx and nasopharynx clear.  NECK:  Supple, no jugular venous distention. No thyroid enlargement, no tenderness.  LUNGS: Normal breath  sounds bilaterally, no wheezing, rales,rhonchi or crepitation. No use of accessory muscles of respiration.  CARDIOVASCULAR: S1, S2 normal. No S3/S4.  ABDOMEN: Soft, nontender, nondistended. Bowel sounds present. No organomegaly or mass.  EXTREMITIES: There is severe tenderness with palpation over the right mid femoral area;  no obvious deformity noted. NEUROLOGIC: No focal weakness. PSYCHIATRIC: The patient is alert and oriented x 3.  SKIN: No obvious rash, lesion, or ulcer.   LABORATORY PANEL:   CBC Recent Labs  Lab 08/06/18 2308  WBC 10.7*  HGB 11.1*  HCT 34.2*  PLT 178  MCV 99.7  MCH 32.4  MCHC 32.5  RDW 13.5   ------------------------------------------------------------------------------------------------------------------  Chemistries  Recent Labs  Lab 08/06/18 2308  NA 138  K 4.0  CL 99  CO2 29  GLUCOSE 124*  BUN 22  CREATININE 0.94  CALCIUM 9.6   ------------------------------------------------------------------------------------------------------------------ estimated creatinine clearance is 44.9 mL/min (by C-G formula based on SCr of 0.94 mg/dL). ------------------------------------------------------------------------------------------------------------------ No results for input(s): TSH, T4TOTAL, T3FREE, THYROIDAB in the last 72 hours.  Invalid input(s): FREET3   Coagulation profile Recent Labs  Lab 08/06/18 2308  INR 0.89   ------------------------------------------------------------------------------------------------------------------- No results for input(s): DDIMER in the last 72 hours. -------------------------------------------------------------------------------------------------------------------  Cardiac Enzymes No results for input(s): CKMB, TROPONINI, MYOGLOBIN in the last 168 hours.  Invalid input(s): CK ------------------------------------------------------------------------------------------------------------------ Invalid input(s):  POCBNP  ---------------------------------------------------------------------------------------------------------------  Urinalysis    Component Value Date/Time   COLORURINE AMBER (A) 08/21/2016 1632   APPEARANCEUR CLEAR (A) 08/21/2016 1632   APPEARANCEUR Clear 12/08/2013 1435   LABSPEC 1.024 08/21/2016 1632   LABSPEC 1.009 12/08/2013 1435   PHURINE 6.0 08/21/2016 1632   GLUCOSEU NEGATIVE 08/21/2016 1632   GLUCOSEU NEGATIVE 02/10/2015 1330   HGBUR NEGATIVE 08/21/2016 1632   BILIRUBINUR neg 07/03/2018 1607   BILIRUBINUR Negative 12/08/2013 1435   KETONESUR NEGATIVE 08/21/2016 1632   PROTEINUR Negative 07/03/2018 1607   PROTEINUR 30 (A) 08/21/2016 1632   UROBILINOGEN 0.2 07/03/2018 1607   UROBILINOGEN 0.2 02/10/2015 1330   NITRITE neg 07/03/2018 1607   NITRITE NEGATIVE 08/21/2016 1632   LEUKOCYTESUR Negative 07/03/2018 1607   LEUKOCYTESUR Negative 12/08/2013 1435     RADIOLOGY: Dg Chest 1 View  Result Date: 08/06/2018 CLINICAL DATA:  Fall. EXAM: CHEST  1 VIEW COMPARISON:  07/18/2017 FINDINGS: Cardiomegaly. There may be a small loculated left lateral pleural effusion. No confluent airspace opacities. No acute bony abnormality. IMPRESSION: Cardiomegaly. Possible small loculated left lateral pleural effusion. Electronically Signed   By: Rolm Baptise M.D.   On: 08/06/2018 22:39   Dg Knee 2 Views Right  Result Date: 08/06/2018 CLINICAL DATA:  84 year old female with fall and right lower extremity pain. EXAM: RIGHT KNEE - 1-2 VIEW COMPARISON:  Right lower extremity radiograph dated 10/27/2010 FINDINGS: There is a total right knee arthroplasty. The arthroplasty components appear intact and in anatomic alignment. No evidence of arthroplasty loosening. There is a displaced spiral fracture of the distal femoral diaphysis with approximately 3 cm medial displacement of the distal fracture fragment. The bones are osteopenic. There is no dislocation. The soft tissues are unremarkable.  IMPRESSION: 1. Displaced spiral fracture of the distal femoral diaphysis. 2. Hardware intact. Electronically Signed   By: Anner Crete M.D.   On: 08/06/2018 22:38   Dg Hip Unilat W Or Wo Pelvis 2-3 Views Right  Result Date: 08/06/2018 CLINICAL DATA:  Fall.  Right hip pain. EXAM: DG HIP (WITH OR WITHOUT PELVIS) 2-3V RIGHT COMPARISON:  CT 04/30/2018 FINDINGS: Changes of remote proximal right femoral fracture and repair. No acute fracture, subluxation or dislocation. Moderate degenerative changes in the hips bilaterally. Diffuse osteopenia. IMPRESSION: Remote right proximal femoral fracture and internal fixation. No acute bony abnormality. Electronically Signed   By: Rolm Baptise M.D.   On: 08/06/2018 22:38  EKG: Orders placed or performed during the hospital encounter of 08/06/18  . ED EKG  . ED EKG  . EKG 12-Lead  . EKG 12-Lead    IMPRESSION AND PLAN:  1.  Acute displaced, spiral fracture of the distal femoral diaphysis.  Patient is scheduled for surgical repair in a.m.  We will keep patient n.p.o. and avoid any anticoagulants.  Continue pain control. Based on the review of patient's medical problems, physical capacity and EKG report, she is medically clear for the above-mentioned surgical procedure.  She is at low to moderate risk for postop cardiopulmonary complications. 2.  Chronic diastolic heart failure, currently clinically compensated.  Will continue medical treatment. 3.  Obstructive sleep apnea.  Patient was unable to tolerate CPAP, but she uses 2 L oxygen per nasal cannula at night. 4.  Hypertension, stable, resume home medications. 5.  Hyperlipidemia, continue statin. 6.  Mild to moderate pulmonary hypertension, type 2 on 3, stable.  Continue 2 L oxygen per nasal cannula at night, per pulmonary specialist recommendations.  All the records are reviewed and case discussed with ED provider. Management plans discussed with the patient, who is in agreement.  CODE STATUS:  Full    TOTAL TIME TAKING CARE OF THIS PATIENT: 50 minutes.    Amelia Jo M.D on 08/07/2018 at 12:04 AM  Between 7am to 6pm - Pager - [phone]  After 6pm go to [email or web address] - [email or web address] [facility] [place] [facility]  [phone]  CC: Primary care physician; Einar Pheasant, MD

## 2018-08-07 NOTE — Anesthesia Preprocedure Evaluation (Addendum)
Anesthesia Evaluation  Patient identified by MRN, date of birth, ID band Patient awake    Reviewed: Allergy & Precautions, H&P , NPO status , Patient's Chart, lab work & pertinent test results, reviewed documented beta blocker date and time   Airway Mallampati: II  TM Distance: >3 FB Neck ROM: full    Dental no notable dental hx.    Pulmonary shortness of breath and with exertion, sleep apnea ,    Pulmonary exam normal breath sounds clear to auscultation       Cardiovascular Exercise Tolerance: Good hypertension, Pt. on medications and Pt. on home beta blockers +CHF   Rhythm:regular Rate:Normal     Neuro/Psych  Headaches, PSYCHIATRIC DISORDERS Anxiety Depression negative neurological ROS  negative psych ROS   GI/Hepatic Neg liver ROS, GERD  ,  Endo/Other  negative endocrine ROSHypothyroidism   Renal/GU Renal disease  negative genitourinary   Musculoskeletal  (+) Arthritis , Osteoarthritis,    Abdominal   Peds negative pediatric ROS (+)  Hematology negative hematology ROS (+) anemia ,   Anesthesia Other Findings Past Medical History: No date: Anemia No date: Anxiety No date: Chest pain No date: CHF (congestive heart failure) ([facility]) No date: Constipation No date: DDD (degenerative disc disease), cervical No date: Depression No date: DVT (deep venous thrombosis) ([facility]) No date: Dysphonia No date: Dyspnea No date: Fatty liver No date: Fatty liver No date: Headache No date: Hyperlipidemia No date: Hyperpiesia No date: Hypertension No date: Hypothyroidism No date: Interstitial cystitis No date: Left ventricular dysfunction No date: Lymphedema No date: Nephrolithiasis No date: Obstructive sleep apnea No date: Osteoarthritis     Comment:  knees/cervical and lumbar spine No date: Pulmonary hypertension (HCC) No date: Pulmonary nodules     Comment:  followed by Dr Raul Del No date: Pure  hypercholesterolemia No date: Renal cyst     Comment:  Right  Fusion from thoracic to S1...  Reproductive/Obstetrics negative OB ROS                            Anesthesia Physical  Anesthesia Plan  ASA: III  Anesthesia Plan: General   Post-op Pain Management:    Induction: Intravenous  PONV Risk Score and Plan:   Airway Management Planned: Oral ETT  Additional Equipment:   Intra-op Plan:   Post-operative Plan: Extubation in OR  Informed Consent: I have reviewed the patients History and Physical, chart, labs and discussed the procedure including the risks, benefits and alternatives for the proposed anesthesia with the patient or authorized representative who has indicated his/her understanding and acceptance.   Dental Advisory Given  Plan Discussed with: CRNA  Anesthesia Plan Comments: (Despite risks of possible post op ventilation, family and patient prefer GOT.)       Anesthesia Quick Evaluation

## 2018-08-07 NOTE — Progress Notes (Signed)
[place] at [facility]                                                                                                                                                                                  Patient Demographics   Kimberly Roy, is a 84 y.o. female, DOB - 12/21/1930, EXO:[id number]  Admit date - 08/06/2018   Admitting Physician Amelia Jo, MD  Outpatient Primary MD for the patient is Einar Pheasant, MD   LOS - 1  Subjective: Patient currently denies any chest pain or shortness of breath states that she slipped when she had a fracture pain is currently under control    Review of Systems:   CONSTITUTIONAL: No documented fever. No fatigue, weakness. No weight gain, no weight loss.  EYES: No blurry or double vision.  ENT: No tinnitus. No postnasal drip. No redness of the oropharynx.  RESPIRATORY: No cough, no wheeze, no hemoptysis. No dyspnea.  CARDIOVASCULAR: No chest pain. No orthopnea. No palpitations. No syncope.  GASTROINTESTINAL: No nausea, no vomiting or diarrhea. No abdominal pain. No melena or hematochezia.  GENITOURINARY: No dysuria or hematuria.  ENDOCRINE: No polyuria or nocturia. No heat or cold intolerance.  HEMATOLOGY: No anemia. No bruising. No bleeding.  INTEGUMENTARY: No rashes. No lesions.  MUSCULOSKELETAL: No arthritis. No swelling. No gout.  Pain under control NEUROLOGIC: No numbness, tingling, or ataxia. No seizure-type activity.  PSYCHIATRIC: No anxiety. No insomnia. No ADD.    Vitals:   Vitals:   08/07/18 0000 08/07/18 0108 08/07/18 0454 08/07/18 0759  BP: (!) 144/50 (!) 185/62  (!) 143/44  Pulse: (!) 50 (!) 49  (!) 53  Resp: _0 Temp:    98.3 F (36.8 C)  TempSrc:      SpO2: 97% 100%  100%  Weight:   84.4 kg   Height:        Wt Readings from Last 3 Encounters:  08/07/18 84.4 kg  07/15/18 83.5 kg  07/03/18 83.4 kg    No intake or output data in the 24 hours ending 08/07/18 1249  Physical Exam:    GENERAL: Pleasant-appearing in no apparent distress.  HEAD, EYES, EARS, NOSE AND THROAT: Atraumatic, normocephalic. Extraocular muscles are intact. Pupils equal and reactive to light. Sclerae anicteric. No conjunctival injection. No oro-pharyngeal erythema.  NECK: Supple. There is no jugular venous distention. No bruits, no lymphadenopathy, no thyromegaly.  HEART: Regular rate and rhythm,. No murmurs, no rubs, no clicks.  LUNGS: Clear to auscultation bilaterally. No rales or rhonchi. No wheezes.  ABDOMEN: Soft, flat, nontender, nondistended. Has good bowel sounds. No hepatosplenomegaly appreciated.  EXTREMITIES: No evidence of any cyanosis, clubbing,  or peripheral edema.  +2 pedal and radial pulses bilaterally.  NEUROLOGIC: The patient is alert, awake, and oriented x3 with no focal motor or sensory deficits appreciated bilaterally.  SKIN: Moist and warm with no rashes appreciated.  Psych: Not anxious, depressed LN: No inguinal LN enlargement    Antibiotics   Anti-infectives (From admission, onward)   Start     Dose/Rate Route Frequency Ordered Stop   08/07/18 1700  ceFAZolin (ANCEF) IVPB 2g/100 mL premix     2 g 200 mL/hr over 30 Minutes Intravenous  Once 08/07/18 0825        Medications   Scheduled Meds: . amLODipine  10 mg Oral Daily  . atorvastatin  20 mg Oral QHS  . benazepril  40 mg Oral Daily  . calcium-vitamin D  1 tablet Oral Daily  . cholecalciferol  1,000 Units Oral q morning - 10a  . docusate sodium  100 mg Oral BID  . DULoxetine  60 mg Oral Daily  . gabapentin  600 mg Oral BID  . hydrochlorothiazide  25 mg Oral Daily  . levothyroxine  100 mcg Oral Daily  . metoprolol succinate  100 mg Oral Daily  . morphine  60 mg Oral BID  . pantoprazole  40 mg Oral BID   Continuous Infusions: .  ceFAZolin (ANCEF) IV     PRN Meds:.acetaminophen **OR** acetaminophen, bisacodyl, HYDROcodone-acetaminophen, HYDROmorphone (DILAUDID) injection, morphine injection, ondansetron  **OR** ondansetron (ZOFRAN) IV, traZODone   Data Review:   Micro Results Recent Results (from the past 240 hour(s))  MRSA PCR Screening     Status: None   Collection Time: 08/07/18  1:16 AM  Result Value Ref Range Status   MRSA by PCR NEGATIVE NEGATIVE Final    Comment:        The GeneXpert MRSA Assay (FDA approved for NASAL specimens only), is one component of a comprehensive MRSA colonization surveillance program. It is not intended to diagnose MRSA infection nor to guide or monitor treatment for MRSA infections. Performed at [facility], [address] [place], [place] [address]     Radiology Reports Dg Chest 1 View  Result Date: 08/06/2018 CLINICAL DATA:  Fall. EXAM: CHEST  1 VIEW COMPARISON:  07/18/2017 FINDINGS: Cardiomegaly. There may be a small loculated left lateral pleural effusion. No confluent airspace opacities. No acute bony abnormality. IMPRESSION: Cardiomegaly. Possible small loculated left lateral pleural effusion. Electronically Signed   By: Rolm Baptise M.D.   On: 08/06/2018 22:39   Dg Knee 2 Views Right  Result Date: 08/06/2018 CLINICAL DATA:  84 year old female with fall and right lower extremity pain. EXAM: RIGHT KNEE - 1-2 VIEW COMPARISON:  Right lower extremity radiograph dated 10/27/2010 FINDINGS: There is a total right knee arthroplasty. The arthroplasty components appear intact and in anatomic alignment. No evidence of arthroplasty loosening. There is a displaced spiral fracture of the distal femoral diaphysis with approximately 3 cm medial displacement of the distal fracture fragment. The bones are osteopenic. There is no dislocation. The soft tissues are unremarkable. IMPRESSION: 1. Displaced spiral fracture of the distal femoral diaphysis. 2. Hardware intact. Electronically Signed   By: Anner Crete M.D.   On: 08/06/2018 22:38   Vas [place] Mesenteric Duplex  Result Date: 07/17/2018 ABDOMINAL VISCERAL Limitations: Air/bowel gas, obesity  and patient discomfort. Performing Technologist: Charlane Ferretti RT (R)(VS) Supporting Technologist: Concha Norway RVT  Examination Guidelines: A complete evaluation includes B-mode imaging, spectral Doppler, color Doppler, and power Doppler as needed of all accessible portions of  each vessel. Bilateral testing is considered an integral part of a complete examination. Limited examinations for reoccurring indications may be performed as noted.  Duplex Findings: +----------------------+--------+--------+------+--------+ Mesenteric            PSV cm/sEDV cm/sPlaqueComments +----------------------+--------+--------+------+--------+ Aorta Prox               90                          +----------------------+--------+--------+------+--------+ Aorta Mid               143                          +----------------------+--------+--------+------+--------+ Aorta Distal            132                          +----------------------+--------+--------+------+--------+ Celiac Artery Proximal   66                          +----------------------+--------+--------+------+--------+ SMA Proximal            112                          +----------------------+--------+--------+------+--------+ SMA Mid                 293      17                  +----------------------+--------+--------+------+--------+ SMA Distal              170                          +----------------------+--------+--------+------+--------+ CHA                     174                          +----------------------+--------+--------+------+--------+ Splenic                 169                          +----------------------+--------+--------+------+--------+ IMA                      93                          +----------------------+--------+--------+------+--------+  Summary: Mesenteric:  No significant stenosis visualized in the mesenteric vessels other than mid SMA which shows increased  velocity and a ratio of 2.6 which indicates possibly a greater than 70% stenosis.  *See table(s) above for measurements and observations.  Diagnosing physician: Leotis Pain MD  Electronically signed by Leotis Pain MD on 07/17/2018 at 2:31:05 PM.    Final    Dg Hip Unilat W Or Wo Pelvis 2-3 Views Right  Result Date: 08/06/2018 CLINICAL DATA:  Fall.  Right hip pain. EXAM: DG HIP (WITH OR WITHOUT PELVIS) 2-3V RIGHT COMPARISON:  CT 04/30/2018 FINDINGS: Changes of remote proximal right femoral fracture and repair. No acute fracture, subluxation or dislocation. Moderate degenerative changes in the hips bilaterally. Diffuse osteopenia. IMPRESSION: Remote right proximal femoral fracture  and internal fixation. No acute bony abnormality. Electronically Signed   By: Rolm Baptise M.D.   On: 08/06/2018 22:38     CBC Recent Labs  Lab 08/06/18 2308 08/07/18 0127  WBC 10.7* 11.1*  HGB 11.1* 10.9*  HCT 34.2* 33.9*  PLT 178 174  MCV 99.7 100.3*  MCH 32.4 32.2  MCHC 32.5 32.2  RDW 13.5 13.5    Chemistries  Recent Labs  Lab 08/06/18 2308 08/07/18 0127  NA 138 138  K 4.0 3.8  CL 99 99  CO2 29 30  GLUCOSE 124* 120*  BUN 22 23  CREATININE 0.94 0.84  CALCIUM 9.6 9.5   ------------------------------------------------------------------------------------------------------------------ estimated creatinine clearance is 50.4 mL/min (by C-G formula based on SCr of 0.84 mg/dL). ------------------------------------------------------------------------------------------------------------------ No results for input(s): HGBA1C in the last 72 hours. ------------------------------------------------------------------------------------------------------------------ No results for input(s): CHOL, HDL, LDLCALC, TRIG, CHOLHDL, LDLDIRECT in the last 72 hours. ------------------------------------------------------------------------------------------------------------------ No results for input(s): TSH, T4TOTAL, T3FREE,  THYROIDAB in the last 72 hours.  Invalid input(s): FREET3 ------------------------------------------------------------------------------------------------------------------ No results for input(s): VITAMINB12, FOLATE, FERRITIN, TIBC, IRON, RETICCTPCT in the last 72 hours.  Coagulation profile Recent Labs  Lab 08/06/18 2308  INR 0.89    No results for input(s): DDIMER in the last 72 hours.  Cardiac Enzymes No results for input(s): CKMB, TROPONINI, MYOGLOBIN in the last 168 hours.  Invalid input(s): CK ------------------------------------------------------------------------------------------------------------------ Invalid input(s): [place]  Patient is 84 year old who presented with hip fracture  1. Acute displaced, spiral fracture of the distal femoral diaphysis.  No further cardiopulmonary work-up needed patient at moderate risk of surgical complications based on her age and other medical problems  2.  Chronic diastolic heart failure, currently clinically compensated.  No evidence of exasperation  Will continue medical treatment. 3.  Obstructive sleep apnea.  Patient was unable to tolerate CPAP, but she uses 2 L oxygen per nasal cannula at night. 4.  Hypertension, stable, resume home medications. 5.  Hyperlipidemia, continue statin. 6.  Mild to moderate pulmonary hypertension, type 2 on 3, stable.  Continue 2 L oxygen per nasal cannula at night, per pulmonary specialist recommendations      Code Status Orders  (From admission, onward)         Start     Ordered   08/07/18 0019  Full code  Continuous     08/07/18 0018        Code Status History    This patient has a current code status but no historical code status.    Advance Directive Documentation     Most Recent Value  Type of Advance Directive  Healthcare Power of Attorney, Living will  Pre-existing out of facility DNR order (yellow form or pink MOST form)  -  "MOST" Form in Place?  -            Consults orthopedic  DVT Prophylaxis  scd's  Lab Results  Component Value Date   PLT 174 08/07/2018     Time Spent in minutes 72mn  Greater than 50% of time spent in care coordination and counseling patient regarding the condition and plan of care.   SDustin FlockM.D on 08/07/2018 at 12:49 PM  Between 7am to 6pm - Pager - [phone]  After 6pm go to [email or web address] - [employer]   Office  [phone]

## 2018-08-07 NOTE — Progress Notes (Signed)
Clinical Education officer, museum (CSW) presented bed offers to patient and her niece Santiago Glad. CSW made patient and Santiago Glad aware of quality measures for the facilities. Per patient she will let CSW know her SNF choice this weekend after PT evaluation.  [employer], LCSW [phone]

## 2018-08-07 NOTE — Care Management Note (Signed)
Case Management Note  Patient Details  Name: YONA KOSEK MRN: [id number] Date of Birth: Sep 05, 1930  Subjective/Objective:                  RNCM met with patient, her niece Santiago Glad and Lydia Guiles husband prior to patient's orthopedic procedure today.  She is from home with her husband and son. Her husband is being [facility] status "several strokes" per patient. Her son is mentally handicapped.  She ambulates at baseline with a walker.  She believes that her daughter [name] is her HCPOA.  She prefers home health with [facility] however she will consider SNF if needed.   Action/Plan: RNCM provided list of home health agencies based on 3-5 star rating per [email or web address]. Referral to [facility] with [facility].  RNCM will follow.   Expected Discharge Date:                  Expected Discharge Plan:     In-House Referral:  Clinical Social Work  Discharge planning Services  CM Consult  Post Acute Care Choice:    Choice offered to:  Patient  DME Arranged:    DME Agency:     HH Arranged:    [place] Agency:  [facility]  Status of Service:  In process, will continue to follow  If discussed at Long Length of Stay Meetings, dates discussed:    Additional Comments:  Marshell Garfinkel, RN 08/07/2018, 11:02 AM

## 2018-08-08 LAB — CBC
HCT: 25.7 % — ABNORMAL LOW (ref 36.0–46.0)
Hemoglobin: 8.3 g/dL — ABNORMAL LOW (ref 12.0–15.0)
MCH: 32 pg (ref 26.0–34.0)
MCHC: 32.3 g/dL (ref 30.0–36.0)
MCV: 99.2 fL (ref 80.0–100.0)
Platelets: 130 10*3/uL — ABNORMAL LOW (ref 150–400)
RBC: 2.59 MIL/uL — AB (ref 3.87–5.11)
RDW: 13.6 % (ref 11.5–15.5)
WBC: 8.4 10*3/uL (ref 4.0–10.5)
nRBC: 0 % (ref 0.0–0.2)

## 2018-08-08 LAB — BASIC METABOLIC PANEL
Anion gap: 7 (ref 5–15)
BUN: 21 mg/dL (ref 8–23)
CO2: 31 mmol/L (ref 22–32)
Calcium: 8.5 mg/dL — ABNORMAL LOW (ref 8.9–10.3)
Chloride: 101 mmol/L (ref 98–111)
Creatinine, Ser: 0.9 mg/dL (ref 0.44–1.00)
GFR calc Af Amer: >60 mL/min (ref >60)
GFR calc non Af Amer: 58 mL/min — ABNORMAL LOW (ref >60)
Glucose, Bld: 122 mg/dL — ABNORMAL HIGH (ref 70–99)
Potassium: 4 mmol/L (ref 3.5–5.1)
Sodium: 139 mmol/L (ref 135–145)

## 2018-08-08 LAB — GLUCOSE, CAPILLARY: Glucose-Capillary: 110 mg/dL — ABNORMAL HIGH (ref 70–99)

## 2018-08-08 MED ORDER — POLYETHYLENE GLYCOL 3350 17 G PO PACK
17.0000 g | PACK | Freq: Every day | ORAL | Status: DC
  Administered 2018-08-08 – 2018-08-10 (×3): 17 g via ORAL
  Filled 2018-08-08 (×3): qty 1

## 2018-08-08 MED ORDER — BUSPIRONE HCL 10 MG PO TABS
10.0000 mg | ORAL_TABLET | Freq: Two times a day (BID) | ORAL | Status: DC
  Administered 2018-08-08 – 2018-08-10 (×5): 10 mg via ORAL
  Filled 2018-08-08 (×5): qty 1

## 2018-08-08 MED ORDER — FE FUMARATE-B12-VIT C-FA-IFC PO CAPS
1.0000 | ORAL_CAPSULE | Freq: Two times a day (BID) | ORAL | Status: DC
  Administered 2018-08-08 – 2018-08-10 (×4): 1 via ORAL
  Filled 2018-08-08 (×6): qty 1

## 2018-08-08 MED ORDER — ALUM & MAG HYDROXIDE-SIMETH 200-200-20 MG/5ML PO SUSP
30.0000 mL | ORAL | Status: DC | PRN
  Administered 2018-08-08: 30 mL via ORAL
  Filled 2018-08-08: qty 30

## 2018-08-08 NOTE — Evaluation (Signed)
Occupational Therapy Evaluation Patient Details Name: Kimberly Roy MRN: [id number] DOB: 07-08-1931 Today's Date: 08/08/2018    History of Present Illness Pt is 84 y/o F who presented to ED via EMS 08/06/18 after mechanical fall at home in which she forgot to lock her 4WW brakes prior to attempt to stand. Pt sustained R dital femur fx and s/p R ORIF 08/07/18. Pt has PMH: OA, DDD with chronic back pain, CHF, HLD, OSA, anxiety and depression.   Clinical Impression   Pt seen for OT evaluation this date, POD#1 from above surgery. Pt was independent in most ADLs prior to surgery requiring light assistance/setup with bathing/dressing and use of 4WW for fxl mobility.  Pt lives in single level ranch home with her husband and son. Pt's niece assists with transportation and other IADLs while [facility] CNAs care for pt's husband. Pt is eager to return to PLOF with improved safety and independence. Pt currently requires MOD/MAX A for LB dressing while in seated position due to pain and limited AROM of R hip, MOD A for sup to sit and sit to stand . Pt educated on TWB precautions for RLE at start of session and unable to verbalize how to implement during ADL and mobility. Pt instructed in how to implement TWB for fxl transfers and self care skills, as well as falls prevention strategies, home/routines modifications, DME/AE for LB bathing and dressing tasks, and compression stocking mgt strategies.  At end of session, pt able to recall TWB restrictions, but does not consistently demonstrate understanding on assessment. Pt would benefit from additional instruction in self care skills and techniques to help maintain precautions with or without assistive devices to support recall and carryover prior to discharge. Recommend SNF upon discharge.      Follow Up Recommendations  SNF    Equipment Recommendations  3 in 1 bedside commode    Recommendations for Other Services       Precautions / Restrictions  Precautions Precautions: Fall Restrictions Weight Bearing Restrictions: Yes RLE Weight Bearing: Touchdown weight bearing      Mobility Bed Mobility Overal bed mobility: Needs Assistance Bed Mobility: Supine to Sit;Sit to Supine     Supine to sit: Mod assist(pt uses bedrail and HOB 30 degrees, but still requires assist to bring R LE toward EOB and stabilizing UB/torso. ) Sit to supine: Mod assist(to manage LEs)      Transfers Overall transfer level: Needs assistance Equipment used: Rolling walker (2 wheeled) Transfers: Sit to/from Stand Sit to Stand: Mod assist              Balance Overall balance assessment: Needs assistance Sitting-balance support: Bilateral upper extremity supported Sitting balance-Leahy Scale: Fair     Standing balance support: Bilateral upper extremity supported Standing balance-Leahy Scale: Fair                             ADL either performed or assessed with clinical judgement   ADL Overall ADL's : Needs assistance/impaired     Grooming: Wash/dry face;Oral care;Set up   Upper Body Bathing: Set up   Lower Body Bathing: Moderate assistance;Sitting/lateral leans   Upper Body Dressing : Minimal assistance;Sitting   Lower Body Dressing: Moderate assistance;Sitting/lateral leans   Toilet Transfer: Maximal assistance;RW;BSC Toilet Transfer Details (indicate cue type and reason): Pt attempts SPS with OT once in standing, but d/t shakiness and inability to decrease pressure being applied to R LE despite cueing, pt returned to  EOB sitting for safety. Toileting- Clothing Manipulation and Hygiene: Moderate assistance;Maximal assistance Toileting - Clothing Manipulation Details (indicate cue type and reason): based on clinical observation Tub/ Shower Transfer: Maximal assistance Tub/Shower Transfer Details (indicate cue type and reason): based on clinical observation  Functional mobility during ADLs: (not attempted at this time d/t pt  intermittently putting weight through R LE in static standing despite cues and education about TWB. )       Vision Baseline Vision/History: Wears glasses Patient Visual Report: No change from baseline       Perception     Praxis      Pertinent Vitals/Pain Pain Assessment: 0-10 Pain Score: 6  Pain Location: pt with limited c/o pain in post-op area, primarily c/o back pain which she states was present prior to fall.  Pain Descriptors / Indicators: Aching;Constant Pain Intervention(s): Limited activity within patient's tolerance;Monitored during session     Hand Dominance     Extremity/Trunk Assessment Upper Extremity Assessment Upper Extremity Assessment: Overall WFL for tasks assessed;RUE deficits/detail;LUE deficits/detail RUE Deficits / Details: Pt with c/o R shoulder pain (hx rotator cuff repair sx), no MMT performed, grip 4/5, ROM WFL.  LUE Deficits / Details: 4/5 shld flex/ext, horixontal abd/add, elbow flex/ext, grip 4/5.    Lower Extremity Assessment Lower Extremity Assessment: Defer to PT evaluation;Generalized weakness;RLE deficits/detail RLE Deficits / Details: RLE grossly 3/5 during tasks performed, pt requires increased time d/t pain.  RLE: Unable to fully assess due to pain       Communication Communication Communication: HOH   Cognition Arousal/Alertness: Awake/alert Behavior During Therapy: WFL for tasks assessed/performed Overall Cognitive Status: Within Functional Limits for tasks assessed                                     General Comments       Exercises Other Exercises Other Exercises: Pt and dtr present in room on evaluation educated on fall prevention and use of call light for safety. Noth parties confirm understanding.  Other Exercises: Pt and dtr educated on TWB status and applications to t/f attempts. Both parties confirm understanding, but reinforcement required.  Other Exercises: Pt and dtr educated on use of AE for LB ADLs  and require reinforcement education.   Shoulder Instructions      Home Living Family/patient expects to be discharged to:: Skilled nursing facility Living Arrangements: Spouse/significant other;Children                               Additional Comments: Pt's son that lives in the residence is "mentally disabled" per pt's daughter who is present in room on evalution. [facility] CNAs also assist with attending to pt's husband daily. Pt's dtr and son-in-law assist intermittently and pt's neice gets groceries, cooks, and provides transportation to appointments.       Prior Functioning/Environment Level of Independence: Independent with assistive device(s)        Comments: Pt used 4WW for fxl mobility, some light assistance/setup for bathing/dressing d/t limited LE ROM (hx OA and knee replacements) and back pain.         OT Problem List: Decreased strength;Decreased range of motion;Decreased activity tolerance;Impaired balance (sitting and/or standing);Decreased knowledge of use of DME or AE;Decreased knowledge of precautions;Pain      OT Treatment/Interventions: Self-care/ADL training;Therapeutic exercise;DME and/or AE instruction;Therapeutic activities;Patient/family education    OT Goals(Current goals  can be found in the care plan section) Acute Rehab OT Goals Patient Stated Goal: to get back to what I was able to do before OT Goal Formulation: With patient/family Time For Goal Achievement: 08/22/18 Potential to Achieve Goals: Good  OT Frequency: Min 1X/week   Barriers to D/C:            Co-evaluation              AM-PAC OT "6 Clicks" Daily Activity     Outcome Measure Help from another person eating meals?: None Help from another person taking care of personal grooming?: A Little Help from another person toileting, which includes using toliet, bedpan, or urinal?: A Lot Help from another person bathing (including washing, rinsing, drying)?: A Lot Help from  another person to put on and taking off regular upper body clothing?: A Little Help from another person to put on and taking off regular lower body clothing?: A Lot 6 Click Score: 16   End of Session Equipment Utilized During Treatment: Gait belt;Rolling walker Nurse Communication: Mobility status(CNA educated on t/f status)  Activity Tolerance: Patient tolerated treatment well Patient left: in bed;with bed alarm set;with call bell/phone within reach;with family/visitor present;Other (comment)(Pt requesting to leave SCDs off to cool down as she complains about heat, RN notified. )  OT Visit Diagnosis: Unsteadiness on feet (R26.81);Muscle weakness (generalized) (M62.81)                Time: 2583-4621 OT Time Calculation (min): 43 min Charges:  OT Evaluation $OT Eval Moderate Complexity: 1 Mod OT Treatments $Self Care/Home Management : 8-22 mins $Therapeutic Activity: 8-22 mins  Gerrianne Scale, MS, OTR/L ascom [phone] or [phone] 08/08/18, 9:35 AM

## 2018-08-08 NOTE — Progress Notes (Signed)
[facility] NAME: Han Lysne    MR#:  [id number]  DATE OF BIRTH:  03/13/1931  SUBJECTIVE:  Pt was having 'jumpy legs" earlier, better now  REVIEW OF SYSTEMS:   Review of Systems  Constitutional: Negative for chills, fever and weight loss.  HENT: Negative for ear discharge, ear pain and nosebleeds.   Eyes: Negative for blurred vision, pain and discharge.  Respiratory: Negative for sputum production, shortness of breath, wheezing and stridor.   Cardiovascular: Negative for chest pain, palpitations, orthopnea and PND.  Gastrointestinal: Negative for abdominal pain, diarrhea, nausea and vomiting.  Genitourinary: Negative for frequency and urgency.  Musculoskeletal: Positive for joint pain. Negative for back pain.  Neurological: Positive for weakness. Negative for sensory change, speech change and focal weakness.  Psychiatric/Behavioral: Negative for depression and hallucinations. The patient is not nervous/anxious.    Tolerating Diet:yes Tolerating PT: pending  DRUG ALLERGIES:   Allergies  Allergen Reactions  . Lyrica [Pregabalin] Swelling  . Atarax [Hydroxyzine]     jittery  . Dicyclomine Other (See Comments)    Abdominal bloating   . Hydroxyzine Hcl     jittery  . Levaquin [Levofloxacin] Swelling  . Nucynta Er [Tapentadol Hcl Er] Other (See Comments)    Severe constipation   . Oxybutynin Other (See Comments)    Blurred vision  . Zoloft [Sertraline Hcl]     Severe headache  . Biaxin [Clarithromycin] Other (See Comments) and Rash    Pt does not remember Pt does not remember  . Sertraline Nausea And Vomiting    Severe headache Severe headache Other reaction(s): Headache Severe headache  . Sulfa Antibiotics Rash    Pt does not remember  . Sulfasalazine Rash    Pt does not remember  . Tape Rash    [place] - redness  . Tapentadol Other (See Comments) and Rash    Severe constipation Severe  constipation Severe constipation Severe constipation Severe constipation    VITALS:  Blood pressure (!) 149/69, pulse 81, temperature 99.9 F (37.7 C), temperature source Oral, resp. rate 18, height _0  (1.6 m), weight 98.4 kg, SpO2 98 %.  PHYSICAL EXAMINATION:   Physical Exam  GENERAL:  84 y.o.-year-old patient lying in the bed with no acute distress.  EYES: Pupils equal, round, reactive to light and accommodation. No scleral icterus. Extraocular muscles intact.  HEENT: Head atraumatic, normocephalic. Oropharynx and nasopharynx clear.  NECK:  Supple, no jugular venous distention. No thyroid enlargement, no tenderness.  LUNGS: Normal breath sounds bilaterally, no wheezing, rales, rhonchi. No use of accessory muscles of respiration.  CARDIOVASCULAR: S1, S2 normal. No murmurs, rubs, or gallops.  ABDOMEN: Soft, nontender, nondistended. Bowel sounds present. No organomegaly or mass.  EXTREMITIES: No cyanosis, clubbing or edema b/l.    NEUROLOGIC: Cranial nerves II through XII are intact. No focal Motor or sensory deficits b/l. weak  PSYCHIATRIC:  patient is alert and oriented x 3.  SKIN: No obvious rash, lesion, or ulcer.   LABORATORY PANEL:  CBC Recent Labs  Lab 08/08/18 0311  WBC 8.4  HGB 8.3*  HCT 25.7*  PLT 130*    Chemistries  Recent Labs  Lab 08/08/18 0311  NA 139  K 4.0  CL 101  CO2 31  GLUCOSE 122*  BUN 21  CREATININE 0.90  CALCIUM 8.5*   Cardiac Enzymes No results for input(s): TROPONINI in the last 168 hours. RADIOLOGY:  Dg Chest 1 View  Result Date: 08/06/2018 CLINICAL  DATA:  Fall. EXAM: CHEST  1 VIEW COMPARISON:  07/18/2017 FINDINGS: Cardiomegaly. There may be a small loculated left lateral pleural effusion. No confluent airspace opacities. No acute bony abnormality. IMPRESSION: Cardiomegaly. Possible small loculated left lateral pleural effusion. Electronically Signed   By: Rolm Baptise M.D.   On: 08/06/2018 22:39   Dg Knee 2 Views Right  Result  Date: 08/06/2018 CLINICAL DATA:  84 year old female with fall and right lower extremity pain. EXAM: RIGHT KNEE - 1-2 VIEW COMPARISON:  Right lower extremity radiograph dated 10/27/2010 FINDINGS: There is a total right knee arthroplasty. The arthroplasty components appear intact and in anatomic alignment. No evidence of arthroplasty loosening. There is a displaced spiral fracture of the distal femoral diaphysis with approximately 3 cm medial displacement of the distal fracture fragment. The bones are osteopenic. There is no dislocation. The soft tissues are unremarkable. IMPRESSION: 1. Displaced spiral fracture of the distal femoral diaphysis. 2. Hardware intact. Electronically Signed   By: Anner Crete M.D.   On: 08/06/2018 22:38   Dg Knee Right Port  Result Date: 08/07/2018 CLINICAL DATA:  Status post ORIF EXAM: PORTABLE RIGHT KNEE - 1-2 VIEW COMPARISON:  08/06/2018 FINDINGS: Lateral compression plate and screw fixation of a distal femoral shaft fracture. Approximately 1/2 shaft width medial displacement of the distal fracture fragment persists. Associated distal cerclage wire. Lateral skin staples and mild soft tissue gas. Prior proximal femoral shaft compression plate fixation hardware, incompletely visualized. Prior right knee arthroplasty, without evidence of complication. IMPRESSION: Status post ORIF of a distal femoral shaft fracture, with mild residual displacement, as described above. Electronically Signed   By: Julian Hy M.D.   On: 08/07/2018 21:59   Dg C-arm 1-60 Min-no Report  Result Date: 08/07/2018 Fluoroscopy was utilized by the requesting physician.  No radiographic interpretation.   Dg Hip Unilat W Or Wo Pelvis 2-3 Views Right  Result Date: 08/06/2018 CLINICAL DATA:  Fall.  Right hip pain. EXAM: DG HIP (WITH OR WITHOUT PELVIS) 2-3V RIGHT COMPARISON:  CT 04/30/2018 FINDINGS: Changes of remote proximal right femoral fracture and repair. No acute fracture, subluxation or  dislocation. Moderate degenerative changes in the hips bilaterally. Diffuse osteopenia. IMPRESSION: Remote right proximal femoral fracture and internal fixation. No acute bony abnormality. Electronically Signed   By: Rolm Baptise M.D.   On: 08/06/2018 22:38   ASSESSMENT AND PLAN:  84 year old who presented with hip fracture  1.Acute displaced,spiral fracture of the distal femoral diaphysis. POD #1 -begin PT -OT has seen pt  2.Chronic diastolic heart failure,currently clinically compensated.  No evidence of exasperationWill continue medical treatment.  3.Obstructive sleep apnea.Patient was unable to tolerate CPAP,but she uses 2 L oxygen per nasal cannula at night.  4.Hypertension, stable,resume home medications.  5.Hyperlipidemia, continue statin.  6.Mild to moderate pulmonary hypertension,type 2on 3,stable.Continue 2 L oxygen per nasal cannula at night,per pulmonary specialist recommendations  D/w pt and dter. PT to see pt and will work on d/c planning     Case discussed with Care Management/Social Worker. Management plans discussed with the patient, family and they are in agreement.  CODE STATUS: full  DVT Prophylaxis: lovenox  TOTAL TIME TAKING CARE OF THIS PATIENT: *32* minutes.  >50% time spent on counselling and coordination of care  POSSIBLE D/C IN *1-2* DAYS, DEPENDING ON CLINICAL CONDITION.  Note: This dictation was prepared with Dragon dictation along with smaller phrase technology. Any transcriptional errors that result from this process are unintentional.  Fritzi Mandes M.D on 08/08/2018 at 3:18 PM  Between 7am to 6pm - Pager - [phone]  After 6pm go to [email or web address] - password [email or web address]  Office  [phone]  CC: Primary care physician; Einar Pheasant, MDPatient ID: [name], female   DOB: March 06, 1931, 84 y.o.   MRN: [id number]

## 2018-08-08 NOTE — Progress Notes (Signed)
PT Cancellation Note  Patient Details Name: Kimberly Roy MRN: [id number] DOB: 23-Dec-1930   Cancelled Treatment:    Reason Eval/Treat Not Completed: Medical issues which prohibited therapy: Patient is having high anxiety and unable to perform eval due to excessive LE movements. PT will monitor the patient and attempt the evaluation later today.    Alanson Puls, [place] DPT 08/08/2018, 11:25 AM

## 2018-08-08 NOTE — Progress Notes (Signed)
Physical Therapy Evaluation Patient Details Name: Kimberly Roy MRN: [id number] DOB: 1931-04-28 Today's Date: 08/08/2018   History of Present Illness   Kimberly Roy  is a 84 y.o. female with history of diastolic CHF, hypertension, hyperlipidemia, pulmonary hypertension, obstructive sleep apnea on 2 L oxygen at night. She lives with her husband and ambulates independently with RW.   Clinical Impression  Patient is s/p R ORIF following a fall and fracture to hip. .She is TDWB RLE and needs max assist for bed mobility and mod assist for transfers sit to stand with RW, TDWB RLE. She is not able to maintain the precaautions of TDWB required for gait. She has decreased strength of RLE and LLE , poor standing posture with trunk flexion and decreased bed mobility. She will continue to need skilled PT and SNF is recommended for DC.     Follow Up Recommendations      Equipment Recommendations       Recommendations for Other Services       Precautions / Restrictions Precautions Precautions: (P) Fall Restrictions Weight Bearing Restrictions: (P) Yes RLE Weight Bearing: (P) Touchdown weight bearing      Mobility  Bed Mobility Overal bed mobility: (P) Needs Assistance Bed Mobility: (P) Supine to Sit     Supine to sit: (P) Max assist Sit to supine: (P) Max assist      Transfers Overall transfer level: (P) Needs assistance Equipment used: (P) Rolling walker (2 wheeled) Transfers: (P) Sit to/from Stand Sit to Stand: (P) Mod assist            Ambulation/Gait Ambulation/Gait assistance: (P) (unable to keep TDWB RLE)              Stairs            Wheelchair Mobility    Modified Rankin (Stroke Patients Only)       Balance Overall balance assessment: (P) Needs assistance Sitting-balance support: (P) Bilateral upper extremity supported Sitting balance-Leahy Scale: (P) Fair     Standing balance support: (P) Bilateral upper extremity supported Standing balance-Leahy  Scale: (P) Fair                               Pertinent Vitals/Pain Pain Assessment: (P) 0-10 Pain Score: (P) 2  Pain Location: (P) LLE Pain Descriptors / Indicators: (P) Aching    Home Living Family/patient expects to be discharged to:: (P) Skilled nursing facility Living Arrangements: (P) Spouse/significant other                    Prior Function Level of Independence: (P) Independent with assistive device(s)               Hand Dominance        Extremity/Trunk Assessment   Upper Extremity Assessment Upper Extremity Assessment: (P) Generalized weakness LUE Deficits / Details: (P) 4/5 shoulder flex/ext    Lower Extremity Assessment Lower Extremity Assessment: (P) Generalized weakness RLE Deficits / Details: (P) LLE 3/5, RLE 2/5       Communication   Communication: (P) No difficulties  Cognition Arousal/Alertness: (P) Awake/alert Behavior During Therapy: (P) WFL for tasks assessed/performed Overall Cognitive Status: (P) Within Functional Limits for tasks assessed  General Comments      Exercises     Assessment/Plan    PT Assessment    PT Problem List         PT Treatment Interventions      PT Goals (Current goals can be found in the Care Plan section)       Frequency     Barriers to discharge        Co-evaluation               AM-PAC PT "6 Clicks" Mobility  Outcome Measure                  End of Session Equipment Utilized During Treatment: (P) Gait belt;Oxygen            Time:  -      Charges:                Alanson Puls PT DPT 08/08/2018, 3:00 PM

## 2018-08-08 NOTE — Plan of Care (Signed)
  Problem: Acute Rehab PT Goals(only PT should resolve) Goal: Pt Will Transfer Bed To Chair/Chair To Bed Flowsheets (Taken 08/08/2018 1457) Pt will Transfer Bed to Chair/Chair to Bed: with mod assist

## 2018-08-08 NOTE — Progress Notes (Addendum)
  Subjective: 1 Day Post-Op Procedure(s) (LRB): OPEN REDUCTION INTERNAL FIXATION (ORIF) DISTAL FEMUR FRACTURE (Right) Patient reports pain as moderate.   Patient seen in rounds with Dr. Rudene Christians. Patient is well, and has had no acute complaints or problems Plan is to go Rehab after hospital stay. Negative for chest pain and shortness of breath Fever: 99.1 low-grade fever Gastrointestinal: Negative for nausea and vomiting  Objective: Vital signs in last 24 hours: Temp:  [97 F (36.1 C)-99.3 F (37.4 C)] 99.1 F (37.3 C) (12/07 0226) Pulse Rate:  [53-85] 79 (12/07 0226) Resp:  [14-20] 18 (12/07 0226) BP: (128-178)/(43-77) 141/56 (12/07 0226) SpO2:  [94 %-100 %] 97 % (12/07 0226) FiO2 (%):  [28 %] 28 % (12/06 2319) Weight:  [98.4 kg] 98.4 kg (12/07 0607)  Intake/Output from previous day:  Intake/Output Summary (Last 24 hours) at 08/08/2018 0650 Last data filed at 08/08/2018 0636 Gross per 24 hour  Intake 1200 ml  Output 500 ml  Net 700 ml    Intake/Output this shift: Total I/O In: 700 [I.V.:700] Out: 500 [Urine:300; Blood:200]  Labs: Recent Labs    08/06/18 2308 08/07/18 0127 08/08/18 0311  HGB 11.1* 10.9* 8.3*   Recent Labs    08/07/18 0127 08/08/18 0311  WBC 11.1* 8.4  RBC 3.38* 2.59*  HCT 33.9* 25.7*  PLT 174 130*   Recent Labs    08/07/18 0127 08/08/18 0311  NA 138 139  K 3.8 4.0  CL 99 101  CO2 30 31  BUN 23 21  CREATININE 0.84 0.90  GLUCOSE 120* 122*  CALCIUM 9.5 8.5*   Recent Labs    08/06/18 2308  INR 0.89     EXAM General - Patient is Alert and Oriented Extremity - Neurovascular intact Dorsiflexion/Plantar flexion intact No cellulitis present Compartment soft Dressing/Incision - blood tinged drainage, but reinforced. Motor Function - intact, moving foot and toes well on exam.   Past Medical History:  Diagnosis Date  . Anemia   . Anxiety   . Chest pain   . CHF (congestive heart failure) ([place])   . Constipation   . DDD  (degenerative disc disease), cervical   . Depression   . DVT (deep venous thrombosis) ([place])   . Dysphonia   . Dyspnea   . Fatty liver   . Fatty liver   . Headache   . Hyperlipidemia   . Hyperpiesia   . Hypertension   . Hypothyroidism   . Interstitial cystitis   . Left ventricular dysfunction   . Lymphedema   . Nephrolithiasis   . Obstructive sleep apnea   . Osteoarthritis    knees/cervical and lumbar spine  . Pulmonary hypertension ([place])   . Pulmonary nodules    followed by Dr Raul Del  . Pure hypercholesterolemia   . Renal cyst    right    Assessment/Plan: 1 Day Post-Op Procedure(s) (LRB): OPEN REDUCTION INTERNAL FIXATION (ORIF) DISTAL FEMUR FRACTURE (Right) Active Problems:   Femur fracture (H[facility])  Estimated body mass index is 38.44 kg/m as calculated from the following:   Height as of this encounter: _0  (1.6 m).   Weight as of this encounter: 98.4 kg. Advance diet Up with therapy D/C IV fluids Discharge to SNF when cleared by medicine. Recheck labs.  DVT Prophylaxis - Lovenox, Foot Pumps and TED hose Toe-touch weight-Bearing to right leg  Reche Dixon, PA-C Orthopaedic Surgery 08/08/2018, 6:50 AM  Post op blood loss anemia, will order Fe supplement and recheck hgb daily

## 2018-08-08 NOTE — Anesthesia Postprocedure Evaluation (Signed)
Anesthesia Post Note  Patient: Kimberly Roy  Procedure(s) Performed: OPEN REDUCTION INTERNAL FIXATION (ORIF) DISTAL FEMUR FRACTURE (Right Leg Upper)  Patient location during evaluation: PACU Anesthesia Type: General Level of consciousness: awake and alert and oriented Pain management: pain level controlled Vital Signs Assessment: post-procedure vital signs reviewed and stable Respiratory status: spontaneous breathing Cardiovascular status: blood pressure returned to baseline Anesthetic complications: no     Last Vitals:  Vitals:   08/08/18 0025 08/08/18 0226  BP: (!) 133/50 (!) 141/56  Pulse: 66 79  Resp: 18 18  Temp: 37.2 C 37.3 C  SpO2: 97% 97%    Last Pain:  Vitals:   08/08/18 0325  TempSrc:   PainSc: Asleep                 Ronnie Mallette

## 2018-08-08 NOTE — Progress Notes (Signed)
Bladder scan 477 MD notified of patient unable to void, order for in and out cath.

## 2018-08-08 NOTE — Discharge Instructions (Signed)
INSTRUCTIONS AFTER Surgery  o Remove items at home which could result in a fall. This includes throw rugs or furniture in walking pathways o ICE to the affected joint every three hours while awake for 30 minutes at a time, for at least the first 3-5 days, and then as needed for pain and swelling.  Continue to use ice for pain and swelling. You may notice swelling that will progress down to the foot and ankle.  This is normal after surgery.  Elevate your leg when you are not up walking on it.   o Continue to use the breathing machine you got in the hospital (incentive spirometer) which will help keep your temperature down.  It is common for your temperature to cycle up and down following surgery, especially at night when you are not up moving around and exerting yourself.  The breathing machine keeps your lungs expanded and your temperature down.   DIET:  As you were doing prior to hospitalization, we recommend a well-balanced diet.  DRESSING / WOUND CARE / SHOWERING  Dressing change as needed.  No showering.  Staples will be removed at 2 weeks at [facility] clinic orthopedics.  Watch for signs of infection.  ACTIVITY  o Increase activity slowly as tolerated, but follow the weight bearing instructions below.   o No driving for 6 weeks or until further direction given by your physician.  You cannot drive while taking narcotics.  o No lifting or carrying greater than 10 lbs. until further directed by your surgeon. o Avoid periods of inactivity such as sitting longer than an hour when not asleep. This helps prevent blood clots.  o You may return to work once you are authorized by your doctor.     WEIGHT BEARING  Toe-touch weightbearing on the right with a walker.   EXERCISES Gait training and ambulation.  Gentle range of motion exercises.  CONSTIPATION  Constipation is defined medically as fewer than three stools per week and severe constipation as less than one stool per week.  Even if you  have a regular bowel pattern at home, your normal regimen is likely to be disrupted due to multiple reasons following surgery.  Combination of anesthesia, postoperative narcotics, change in appetite and fluid intake all can affect your bowels.   YOU MUST use at least one of the following options; they are listed in order of increasing strength to get the job done.  They are all available over the counter, and you may need to use some, POSSIBLY even all of these options:    Drink plenty of fluids (prune juice may be helpful) and high fiber foods Colace 100 mg by mouth twice a day  Senokot for constipation as directed and as needed Dulcolax (bisacodyl), take with full glass of water  Miralax (polyethylene glycol) once or twice a day as needed.  If you have tried all these things and are unable to have a bowel movement in the first 3-4 days after surgery call either your surgeon or your primary doctor.    If you experience loose stools or diarrhea, hold the medications until you stool forms back up.  If your symptoms do not get better within 1 week or if they get worse, check with your doctor.  If you experience "the worst abdominal pain ever" or develop nausea or vomiting, please contact the office immediately for further recommendations for treatment.   ITCHING:  If you experience itching with your medications, try taking only a single pain  pill, or even half a pain pill at a time.  You can also use Benadryl over the counter for itching or also to help with sleep.   TED HOSE STOCKINGS:  Use stockings on both legs until for at least 2 weeks or as directed by physician office. They may be removed at night for sleeping.  MEDICATIONS:  See your medication summary on the After Visit Summary that nursing will review with you.  You may have some home medications which will be placed on hold until you complete the course of blood thinner medication.  It is important for you to complete the blood thinner  medication as prescribed.  PRECAUTIONS:  If you experience chest pain or shortness of breath - call 911 immediately for transfer to the hospital emergency department.   If you develop a fever greater that 101 F, purulent drainage from wound, increased redness or drainage from wound, foul odor from the wound/dressing, or calf pain - CONTACT YOUR SURGEON.                                                   FOLLOW-UP APPOINTMENTS:  If you do not already have a post-op appointment, please call the office for an appointment to be seen by your surgeon.  Guidelines for how soon to be seen are listed in your After Visit Summary, but are typically between 1-4 weeks after surgery.  OTHER INSTRUCTIONS:     MAKE SURE YOU:   Understand these instructions.   Get help right away if you are not doing well or get worse.    Thank you for letting us be a part of your medical care team.  It is a privilege we respect greatly.  We hope these instructions will help you stay on track for a fast and full recovery!

## 2018-08-09 LAB — HEMOGLOBIN: Hemoglobin: 7.2 g/dL — ABNORMAL LOW (ref 12.0–15.0)

## 2018-08-09 LAB — BASIC METABOLIC PANEL
Anion gap: 6 (ref 5–15)
BUN: 19 mg/dL (ref 8–23)
CO2: 31 mmol/L (ref 22–32)
Calcium: 8 mg/dL — ABNORMAL LOW (ref 8.9–10.3)
Chloride: 100 mmol/L (ref 98–111)
Creatinine, Ser: 0.99 mg/dL (ref 0.44–1.00)
GFR calc Af Amer: >60 mL/min (ref >60)
GFR, EST NON AFRICAN AMERICAN: 52 mL/min — AB (ref >60)
Glucose, Bld: 112 mg/dL — ABNORMAL HIGH (ref 70–99)
Potassium: 3.4 mmol/L — ABNORMAL LOW (ref 3.5–5.1)
SODIUM: 137 mmol/L (ref 135–145)

## 2018-08-09 LAB — GLUCOSE, CAPILLARY: GLUCOSE-CAPILLARY: 88 mg/dL (ref 70–99)

## 2018-08-09 LAB — HEMOGLOBIN AND HEMATOCRIT, BLOOD
HCT: 26 % — ABNORMAL LOW (ref 36.0–46.0)
Hemoglobin: 8.4 g/dL — ABNORMAL LOW (ref 12.0–15.0)

## 2018-08-09 LAB — PREPARE RBC (CROSSMATCH)

## 2018-08-09 MED ORDER — OXYCODONE HCL 5 MG PO TABS: 5.0000 mg | ORAL_TABLET | ORAL | 0 refills | Status: DC | PRN

## 2018-08-09 MED ORDER — ENOXAPARIN SODIUM 40 MG/0.4ML ~~LOC~~ SOLN: 40.0000 mg | SUBCUTANEOUS | 0 refills | Status: DC

## 2018-08-09 NOTE — Progress Notes (Signed)
[place] [facility] [place] NAME: Phylis Javed    MR#:  [id number]  DATE OF BIRTH:  [birth year]  SUBJECTIVE:   No new complaints dter in the room Pt getting BT  REVIEW OF SYSTEMS:   Review of Systems  Constitutional: Negative for chills, fever and weight loss.  HENT: Negative for ear discharge, ear pain and nosebleeds.   Eyes: Negative for blurred vision, pain and discharge.  Respiratory: Negative for sputum production, shortness of breath, wheezing and stridor.   Cardiovascular: Negative for chest pain, palpitations, orthopnea and PND.  Gastrointestinal: Negative for abdominal pain, diarrhea, nausea and vomiting.  Genitourinary: Negative for frequency and urgency.  Musculoskeletal: Positive for joint pain. Negative for back pain.  Neurological: Positive for weakness. Negative for sensory change, speech change and focal weakness.  Psychiatric/Behavioral: Negative for depression and hallucinations. The patient is not nervous/anxious.    Tolerating Diet:yes Tolerating PT: rehab  DRUG ALLERGIES:   Allergies  Allergen Reactions  . Lyrica [Pregabalin] Swelling  . Atarax [Hydroxyzine]     jittery  . Dicyclomine Other (See Comments)    Abdominal bloating   . Hydroxyzine Hcl     jittery  . Levaquin [Levofloxacin] Swelling  . Nucynta Er [Tapentadol Hcl Er] Other (See Comments)    Severe constipation   . Oxybutynin Other (See Comments)    Blurred vision  . Zoloft [Sertraline Hcl]     Severe headache  . Biaxin [Clarithromycin] Other (See Comments) and Rash    Pt does not remember Pt does not remember  . Sertraline Nausea And Vomiting    Severe headache Severe headache Other reaction(s): Headache Severe headache  . Sulfa Antibiotics Rash    Pt does not remember  . Sulfasalazine Rash    Pt does not remember  . Tape Rash    Durabond - redness  . Tapentadol Other (See Comments) and Rash    Severe constipation Severe  constipation Severe constipation Severe constipation Severe constipation    VITALS:  Blood pressure (!) 116/36, pulse 70, temperature 98.4 F (36.9 C), temperature source Oral, resp. rate 19, height _0  (1.6 m), weight 98.4 kg, SpO2 95 %.  PHYSICAL EXAMINATION:   Physical Exam  GENERAL:  84 y.o.-year-old patient lying in the bed with no acute distress.  EYES: Pupils equal, round, reactive to light and accommodation. No scleral icterus. Extraocular muscles intact.  HEENT: Head atraumatic, normocephalic. Oropharynx and nasopharynx clear.  NECK:  Supple, no jugular venous distention. No thyroid enlargement, no tenderness.  LUNGS: Normal breath sounds bilaterally, no wheezing, rales, rhonchi. No use of accessory muscles of respiration.  CARDIOVASCULAR: S1, S2 normal. No murmurs, rubs, or gallops.  ABDOMEN: Soft, nontender, nondistended. Bowel sounds present. No organomegaly or mass.  EXTREMITIES: No cyanosis, clubbing or edema b/l.    NEUROLOGIC: Cranial nerves II through XII are intact. No focal Motor or sensory deficits b/l. weak  PSYCHIATRIC:  patient is alert and oriented x 3.  SKIN: No obvious rash, lesion, or ulcer.   LABORATORY PANEL:  CBC Recent Labs  Lab 08/08/18 0311 08/09/18 0324  WBC 8.4  --   HGB 8.3* 7.2*  HCT 25.7*  --   PLT 130*  --     Chemistries  Recent Labs  Lab 08/09/18 0324  NA 137  K 3.4*  CL 100  CO2 31  GLUCOSE 112*  BUN 19  CREATININE 0.99  CALCIUM 8.0*   Cardiac Enzymes No results for input(s): TROPONINI in  the last 168 hours. RADIOLOGY:  Dg Knee Right Port  Result Date: 08/07/2018 CLINICAL DATA:  Status post ORIF EXAM: PORTABLE RIGHT KNEE - 1-2 VIEW COMPARISON:  08/06/2018 FINDINGS: Lateral compression plate and screw fixation of a distal femoral shaft fracture. Approximately 1/2 shaft width medial displacement of the distal fracture fragment persists. Associated distal cerclage wire. Lateral skin staples and mild soft tissue gas.  Prior proximal femoral shaft compression plate fixation hardware, incompletely visualized. Prior right knee arthroplasty, without evidence of complication. IMPRESSION: Status post ORIF of a distal femoral shaft fracture, with mild residual displacement, as described above. Electronically Signed   By: Julian Hy M.D.   On: 08/07/2018 21:59   Dg C-arm 1-60 Min-no Report  Result Date: 08/07/2018 Fluoroscopy was utilized by the requesting physician.  No radiographic interpretation.   ASSESSMENT AND PLAN:  84 year old who presented with hip fracture  1.Acute displaced,spiral fracture of the distal femoral diaphysis. POD #1 -begin PT -recommends rehab --post op acute anemia--1 unit T today  2.Chronic diastolic heart failure,currently clinically compensated.  No evidence of exasperationWill continue medical treatment.  3.Obstructive sleep apnea.Patient was unable to tolerate CPAP,but she uses 2 L oxygen per nasal cannula at night.  4.Hypertension, stable,resume home medications.  5.Hyperlipidemia, continue statin.  6.Mild to moderate pulmonary hypertension,type 2on 3,stable.Continue 2 L oxygen per nasal cannula at night,per pulmonary specialist recommendations  D/w pt and dter. CSW to work on d/c planning     Case discussed with Care Management/Social Worker. Management plans discussed with the patient, family and they are in agreement.  CODE STATUS: full  DVT Prophylaxis: lovenox  TOTAL TIME TAKING CARE OF THIS PATIENT: *32* minutes.  >50% time spent on counselling and coordination of care  POSSIBLE D/C IN *1-2* DAYS, DEPENDING ON CLINICAL CONDITION.  Note: This dictation was prepared with Dragon dictation along with smaller phrase technology. Any transcriptional errors that result from this process are unintentional.  Fritzi Mandes M.D on 08/09/2018 at 3:52 PM  Between 7am to 6pm - Pager - [phone]  After 6pm go to [email or web address] - password  [facility]  Office  [phone]  CC: Primary care physician; Einar Pheasant, MDPatient ID: [name], female   DOB: Aug 11, 1931, 84 y.o.   MRN: [id number]

## 2018-08-09 NOTE — Progress Notes (Signed)
  Subjective: 2 Days Post-Op Procedure(s) (LRB): OPEN REDUCTION INTERNAL FIXATION (ORIF) DISTAL FEMUR FRACTURE (Right) Patient reports pain as moderate.   Patient seen in rounds with Dr. Rudene Christians. Patient is well, and has had no acute complaints or problems Plan is to go Rehab after hospital stay. Negative for chest pain and shortness of breath Fever: 99  Gastrointestinal: Negative for nausea and vomiting  Objective: Vital signs in last 24 hours: Temp:  [99 F (37.2 C)-99.9 F (37.7 C)] 99 F (37.2 C) (12/07 2334) Pulse Rate:  [69-89] 89 (12/07 2334) Resp:  [19] 19 (12/07 2334) BP: (115-149)/(37-69) 115/48 (12/07 2334) SpO2:  [91 %-98 %] 91 % (12/07 2334)  Intake/Output from previous day:  Intake/Output Summary (Last 24 hours) at 08/09/2018 0700 Last data filed at 08/08/2018 1858 Gross per 24 hour  Intake 600 ml  Output -  Net 600 ml    Intake/Output this shift: No intake/output data recorded.  Labs: Recent Labs    08/06/18 2308 08/07/18 0127 08/08/18 0311 08/09/18 0324  HGB 11.1* 10.9* 8.3* 7.2*   Recent Labs    08/07/18 0127 08/08/18 0311  WBC 11.1* 8.4  RBC 3.38* 2.59*  HCT 33.9* 25.7*  PLT 174 130*   Recent Labs    08/08/18 0311 08/09/18 0324  NA 139 137  K 4.0 3.4*  CL 101 100  CO2 31 31  BUN 21 19  CREATININE 0.90 0.99  GLUCOSE 122* 112*  CALCIUM 8.5* 8.0*   Recent Labs    08/06/18 2308  INR 0.89     EXAM General - Patient is Alert and Oriented Extremity - Neurovascular intact Dorsiflexion/Plantar flexion intact No cellulitis present Compartment soft Dressing/Incision -dressing changed to new honeycomb with reinforced gauze. Motor Function - intact, moving foot and toes well on exam.   Past Medical History:  Diagnosis Date  . Anemia   . Anxiety   . Chest pain   . CHF (congestive heart failure) ([place])   . Constipation   . DDD (degenerative disc disease), cervical   . Depression   . DVT (deep venous thrombosis) ([place])   .  Dysphonia   . Dyspnea   . Fatty liver   . Fatty liver   . Headache   . Hyperlipidemia   . Hyperpiesia   . Hypertension   . Hypothyroidism   . Interstitial cystitis   . Left ventricular dysfunction   . Lymphedema   . Nephrolithiasis   . Obstructive sleep apnea   . Osteoarthritis    knees/cervical and lumbar spine  . Pulmonary hypertension ([place])   . Pulmonary nodules    followed by Dr Raul Del  . Pure hypercholesterolemia   . Renal cyst    right    Assessment/Plan: 2 Days Post-Op Procedure(s) (LRB): OPEN REDUCTION INTERNAL FIXATION (ORIF) DISTAL FEMUR FRACTURE (Right) Active Problems:   Femur fracture (HCC)  Estimated body mass index is 38.44 kg/m as calculated from the following:   Height as of this encounter: 5' 3" (1.6 m).   Weight as of this encounter: 98.4 kg. Advance diet Up with therapy D/C IV fluids Discharge to SNF when cleared by medicine. Recheck labs..  Postop anemia.  Hemoglobin 7.2.  Transfuse 1 unit packed red blood cell with recheck hemoglobin.  DVT Prophylaxis - Lovenox, Foot Pumps and TED hose Toe-touch weight-Bearing to right leg  Reche Dixon, PA-C Orthopaedic Surgery 08/09/2018, 7:00 AM

## 2018-08-10 ENCOUNTER — Telehealth: Payer: Self-pay

## 2018-08-10 ENCOUNTER — Encounter: Payer: Self-pay | Admitting: Orthopedic Surgery

## 2018-08-10 DIAGNOSIS — E785 Hyperlipidemia, unspecified: Secondary | ICD-10-CM | POA: Diagnosis not present

## 2018-08-10 DIAGNOSIS — R52 Pain, unspecified: Secondary | ICD-10-CM | POA: Diagnosis not present

## 2018-08-10 DIAGNOSIS — I5032 Chronic diastolic (congestive) heart failure: Secondary | ICD-10-CM | POA: Diagnosis not present

## 2018-08-10 DIAGNOSIS — Z918 Other specified personal risk factors, not elsewhere classified: Secondary | ICD-10-CM | POA: Diagnosis not present

## 2018-08-10 DIAGNOSIS — Z7401 Bed confinement status: Secondary | ICD-10-CM | POA: Diagnosis not present

## 2018-08-10 DIAGNOSIS — G4733 Obstructive sleep apnea (adult) (pediatric): Secondary | ICD-10-CM | POA: Diagnosis not present

## 2018-08-10 DIAGNOSIS — R278 Other lack of coordination: Secondary | ICD-10-CM | POA: Diagnosis not present

## 2018-08-10 DIAGNOSIS — R262 Difficulty in walking, not elsewhere classified: Secondary | ICD-10-CM | POA: Diagnosis not present

## 2018-08-10 DIAGNOSIS — D649 Anemia, unspecified: Secondary | ICD-10-CM | POA: Diagnosis not present

## 2018-08-10 DIAGNOSIS — I5033 Acute on chronic diastolic (congestive) heart failure: Secondary | ICD-10-CM | POA: Diagnosis present

## 2018-08-10 DIAGNOSIS — F1120 Opioid dependence, uncomplicated: Secondary | ICD-10-CM | POA: Diagnosis not present

## 2018-08-10 DIAGNOSIS — S7291XA Unspecified fracture of right femur, initial encounter for closed fracture: Secondary | ICD-10-CM | POA: Diagnosis not present

## 2018-08-10 DIAGNOSIS — I272 Pulmonary hypertension, unspecified: Secondary | ICD-10-CM | POA: Diagnosis not present

## 2018-08-10 DIAGNOSIS — I959 Hypotension, unspecified: Secondary | ICD-10-CM | POA: Diagnosis not present

## 2018-08-10 DIAGNOSIS — K219 Gastro-esophageal reflux disease without esophagitis: Secondary | ICD-10-CM | POA: Diagnosis not present

## 2018-08-10 DIAGNOSIS — Z741 Need for assistance with personal care: Secondary | ICD-10-CM | POA: Diagnosis not present

## 2018-08-10 DIAGNOSIS — S72401A Unspecified fracture of lower end of right femur, initial encounter for closed fracture: Secondary | ICD-10-CM | POA: Diagnosis not present

## 2018-08-10 DIAGNOSIS — I509 Heart failure, unspecified: Secondary | ICD-10-CM | POA: Diagnosis not present

## 2018-08-10 DIAGNOSIS — W19XXXD Unspecified fall, subsequent encounter: Secondary | ICD-10-CM | POA: Diagnosis not present

## 2018-08-10 DIAGNOSIS — I110 Hypertensive heart disease with heart failure: Secondary | ICD-10-CM | POA: Diagnosis not present

## 2018-08-10 DIAGNOSIS — F329 Major depressive disorder, single episode, unspecified: Secondary | ICD-10-CM | POA: Diagnosis not present

## 2018-08-10 DIAGNOSIS — I10 Essential (primary) hypertension: Secondary | ICD-10-CM | POA: Diagnosis not present

## 2018-08-10 DIAGNOSIS — M2550 Pain in unspecified joint: Secondary | ICD-10-CM | POA: Diagnosis not present

## 2018-08-10 DIAGNOSIS — Z9181 History of falling: Secondary | ICD-10-CM | POA: Diagnosis not present

## 2018-08-10 DIAGNOSIS — M6281 Muscle weakness (generalized): Secondary | ICD-10-CM | POA: Diagnosis not present

## 2018-08-10 DIAGNOSIS — E039 Hypothyroidism, unspecified: Secondary | ICD-10-CM | POA: Diagnosis not present

## 2018-08-10 DIAGNOSIS — M80051A Age-related osteoporosis with current pathological fracture, right femur, initial encounter for fracture: Secondary | ICD-10-CM | POA: Diagnosis not present

## 2018-08-10 DIAGNOSIS — M545 Low back pain: Secondary | ICD-10-CM | POA: Diagnosis not present

## 2018-08-10 LAB — BASIC METABOLIC PANEL
ANION GAP: 3 — AB (ref 5–15)
BUN: 21 mg/dL (ref 8–23)
CO2: 34 mmol/L — ABNORMAL HIGH (ref 22–32)
Calcium: 8 mg/dL — ABNORMAL LOW (ref 8.9–10.3)
Chloride: 99 mmol/L (ref 98–111)
Creatinine, Ser: 0.92 mg/dL (ref 0.44–1.00)
GFR calc Af Amer: >60 mL/min (ref >60)
GFR, EST NON AFRICAN AMERICAN: 57 mL/min — AB (ref >60)
Glucose, Bld: 109 mg/dL — ABNORMAL HIGH (ref 70–99)
Potassium: 3.6 mmol/L (ref 3.5–5.1)
Sodium: 136 mmol/L (ref 135–145)

## 2018-08-10 LAB — HEMOGLOBIN: Hemoglobin: 8.2 g/dL — ABNORMAL LOW (ref 12.0–15.0)

## 2018-08-10 LAB — GLUCOSE, CAPILLARY: Glucose-Capillary: 97 mg/dL (ref 70–99)

## 2018-08-10 MED ORDER — BISACODYL 10 MG RE SUPP
10.0000 mg | Freq: Once | RECTAL | Status: AC
  Administered 2018-08-10: 10 mg via RECTAL
  Filled 2018-08-10: qty 1

## 2018-08-10 MED ORDER — FLEET ENEMA 7-19 GM/118ML RE ENEM
1.0000 | ENEMA | Freq: Once | RECTAL | Status: AC
  Administered 2018-08-10: 1 via RECTAL

## 2018-08-10 MED ORDER — HYDROCODONE-ACETAMINOPHEN 10-325 MG PO TABS: 1.0000 | ORAL_TABLET | Freq: Three times a day (TID) | ORAL | 0 refills | Status: DC

## 2018-08-10 MED ORDER — MORPHINE SULFATE ER 60 MG PO TBCR: 60.0000 mg | EXTENDED_RELEASE_TABLET | Freq: Two times a day (BID) | ORAL | 0 refills | Status: DC

## 2018-08-10 MED ORDER — METOPROLOL SUCCINATE ER 50 MG PO TB24: 50.0000 mg | ORAL_TABLET | Freq: Every day | ORAL | 0 refills | Status: DC

## 2018-08-10 MED ORDER — BENAZEPRIL HCL 20 MG PO TABS: 20.0000 mg | ORAL_TABLET | Freq: Every day | ORAL | 0 refills | Status: DC

## 2018-08-10 MED ORDER — FE FUMARATE-B12-VIT C-FA-IFC PO CAPS: 1.0000 | ORAL_CAPSULE | Freq: Two times a day (BID) | ORAL | 0 refills | Status: DC

## 2018-08-10 NOTE — Telephone Encounter (Signed)
Yes.  We can f/u after she is discharged from [facility].  Thanks.

## 2018-08-10 NOTE — Telephone Encounter (Signed)
Patient has been discharged to [facility] she would see Dr. Silvio Pate at [facility] right?

## 2018-08-10 NOTE — Discharge Summary (Signed)
[place] at [place] NAME: Kimberly Roy    MR#:  [id number]  DATE OF BIRTH:  1931/01/09  DATE OF ADMISSION:  08/06/2018 ADMITTING PHYSICIAN: Amelia Jo, MD  DATE OF DISCHARGE: 08/10/2018  PRIMARY CARE PHYSICIAN: Einar Pheasant, MD    ADMISSION DIAGNOSIS:  Closed displaced spiral fracture of shaft of right femur, initial encounter ([place]) [S72.341A] Fall, initial encounter [W19.XXXA]  DISCHARGE DIAGNOSIS:  Acute displaced,spiral fracture of the distal femoral diaphysis. POD # 3 Acute post-op Anemia--s/p BT  SECONDARY DIAGNOSIS:   Past Medical History:  Diagnosis Date  . Anemia   . Anxiety   . Chest pain   . CHF (congestive heart failure) ([place])   . Constipation   . DDD (degenerative disc disease), cervical   . Depression   . DVT (deep venous thrombosis) ([place])   . Dysphonia   . Dyspnea   . Fatty liver   . Fatty liver   . Headache   . Hyperlipidemia   . Hyperpiesia   . Hypertension   . Hypothyroidism   . Interstitial cystitis   . Left ventricular dysfunction   . Lymphedema   . Nephrolithiasis   . Obstructive sleep apnea   . Osteoarthritis    knees/cervical and lumbar spine  . Pulmonary hypertension ([place])   . Pulmonary nodules    followed by Dr Raul Del  . Pure hypercholesterolemia   . Renal cyst    right    HOSPITAL COURSE:   84 year old who presented with hip fracture  1.Acute displaced,spiral fracture of the distal femoral diaphysis. POD # 3 -begin PT -recommends rehab --post op acute anemia--1 unit BT--hgb 8.2  2.Chronic diastolic heart failure,currently clinically compensated.No evidence of exacerbationWill continue medical treatment.  3.Obstructive sleep apnea.Patient was unable to tolerate CPAP,but she uses 2 L oxygen per nasal cannula at night.  4.Hypertension BP soft D/ced HCTZ Decreased benzapril to 20 mg and toprol XL 50 mg daily Holding parameter for SBP  <115  5.Hyperlipidemia, continue statin.  6.Mild to moderate pulmonary hypertension,type 2on 3,stable.Continue 2 L oxygen per nasal cannula at night,per pulmonary specialist recommendations  7.DVT prophylaxis per ortho  D/w pt and dter. CSW to work on d/c planning--d/c to rehab today pending bed availability.   CONSULTS OBTAINED:  Treatment Team:  Hessie Knows, MD  DRUG ALLERGIES:   Allergies  Allergen Reactions  . Lyrica [Pregabalin] Swelling  . Atarax [Hydroxyzine]     jittery  . Dicyclomine Other (See Comments)    Abdominal bloating   . Hydroxyzine Hcl     jittery  . Levaquin [Levofloxacin] Swelling  . Nucynta Er [Tapentadol Hcl Er] Other (See Comments)    Severe constipation   . Oxybutynin Other (See Comments)    Blurred vision  . Zoloft [Sertraline Hcl]     Severe headache  . Biaxin [Clarithromycin] Other (See Comments) and Rash    Pt does not remember Pt does not remember  . Sertraline Nausea And Vomiting    Severe headache Severe headache Other reaction(s): Headache Severe headache  . Sulfa Antibiotics Rash    Pt does not remember  . Sulfasalazine Rash    Pt does not remember  . Tape Rash    Durabond - redness  . Tapentadol Other (See Comments) and Rash    _0     DISCHARGE MEDICATIONS:   Allergies as of 08/10/2018      Reactions   Lyrica [pregabalin] Swelling  Atarax [hydroxyzine]    jittery   Dicyclomine Other (See Comments)   Abdominal bloating   Hydroxyzine Hcl    jittery   Levaquin [levofloxacin] Swelling   Nucynta Er [tapentadol Hcl Er] Other (See Comments)   Severe constipation   Oxybutynin Other (See Comments)   Blurred vision   Zoloft [sertraline Hcl]    Severe headache   Biaxin [clarithromycin] Other (See Comments), Rash   Pt does not remember Pt does not remember   Sertraline Nausea And Vomiting   Severe  headache Severe headache Other reaction(s): Headache Severe headache   Sulfa Antibiotics Rash   Pt does not remember   Sulfasalazine Rash   Pt does not remember   Tape Rash   Durabond - redness   Tapentadol Other (See Comments), Rash   _0       Medication List    STOP taking these medications   Calcium-Magnesium-Vitamin D 600-40-500 MG-MG-UNIT Tb24   hydrochlorothiazide 25 MG tablet Commonly known as:  HYDRODIURIL   mupirocin ointment 2 % Commonly known as:  BACTROBAN   nystatin powder Commonly known as:  MYCOSTATIN/NYSTOP   polyethylene glycol powder powder Commonly known as:  GLYCOLAX/MIRALAX     TAKE these medications   ALIGN PO Take by mouth daily.   aluminum-magnesium hydroxide-simethicone 814-481-85 MG/5ML Susp Commonly known as:  MAALOX Take 15 mLs by mouth 4 (four) times daily -  before meals and at bedtime.   amLODipine 10 MG tablet Commonly known as:  NORVASC TAKE 1 TABLET BY MOUTH DAILY   atorvastatin 20 MG tablet Commonly known as:  LIPITOR TAKE ONE TABLET BY MOUTH AT BEDTIME   benazepril 20 MG tablet Commonly known as:  LOTENSIN Take 1 tablet (20 mg total) by mouth daily. What changed:    medication strength  how much to take   busPIRone 10 MG tablet Commonly known as:  BUSPAR Take 10 mg by mouth 2 (two) times daily.   calcium citrate-vitamin D 315-200 MG-UNIT tablet Commonly known as:  CITRACAL+D Take 1 tablet by mouth daily.   Casanthranol-Docusate Sodium 30-100 MG Caps Take 1 capsule by mouth daily.   cephALEXin 250 MG capsule Commonly known as:  KEFLEX Take 250 mg by mouth daily.   diclofenac sodium 1 % Gel Commonly known as:  VOLTAREN Apply topically.   DULoxetine 60 MG capsule Commonly known as:  CYMBALTA TAKE 1 CAPSULE BY MOUTH EVERY DAY.   enoxaparin 40 MG/0.4ML injection Commonly known as:  LOVENOX Inject 0.4 mLs  (40 mg total) into the skin daily.   ferrous [id number]-CHYIFOY C-folic acid capsule Commonly known as:  TRINSICON / FOLTRIN Take 1 capsule by mouth 2 (two) times daily after a meal.   FIBER CHOICE PO Take 2 tablets by mouth daily. Chew tablets   FLUoxetine 20 MG tablet Commonly known as:  PROZAC   fluticasone 50 MCG/ACT nasal spray Commonly known as:  FLONASE   gabapentin 300 MG capsule Commonly known as:  NEURONTIN Take 600 mg by mouth 2 (two) times daily.   GAS-X PO Take 2 tablets by mouth 2 (two) times daily.   HYDROcodone-acetaminophen 10-325 MG tablet Commonly known as:  NORCO Take 1 tablet by mouth 3 (three) times daily.   levothyroxine 100 MCG tablet Commonly known as:  SYNTHROID, LEVOTHROID Take 1 tablet (100 mcg total) by mouth daily.   magnesium oxide 400 MG tablet Commonly known as:  MAG-OX Take 1 tablet (400 mg total) by mouth  daily.   metoprolol succinate 50 MG 24 hr tablet Commonly known as:  TOPROL-XL Take 1 tablet (50 mg total) by mouth daily. Take with or immediately following a meal. What changed:    medication strength  See the new instructions.   morphine 60 MG 12 hr tablet Commonly known as:  MS CONTIN Take 1 tablet (60 mg total) by mouth 2 (two) times daily.   pantoprazole 40 MG tablet Commonly known as:  PROTONIX TAKE 1 TABLET BY MOUTH TWICE A DAY.   senna 8.6 MG Tabs tablet Commonly known as:  SENOKOT Take 1 tablet by mouth 2 (two) times daily.   sodium chloride 0.65 % nasal spray Commonly known as:  OCEAN Place 1 spray into the nose as needed.   SYMPROIC 0.2 MG Tabs Generic drug:  Naldemedine Tosylate Take 0.2 mg by mouth daily.   triamcinolone cream 0.1 % Commonly known as:  KENALOG   Vitamin D3 25 MCG (1000 UT) Caps Take by mouth.       If you experience worsening of your admission symptoms, develop shortness of breath, life threatening emergency, suicidal or homicidal thoughts you must seek medical attention  immediately by calling 911 or calling your MD immediately  if symptoms less severe.  You Must read complete instructions/literature along with all the possible adverse reactions/side effects for all the Medicines you take and that have been prescribed to you. Take any new Medicines after you have completely understood and accept all the possible adverse reactions/side effects.   Please note  You were cared for by a hospitalist during your hospital stay. If you have any questions about your discharge medications or the care you received while you were in the hospital after you are discharged, you can call the unit and asked to speak with the hospitalist on call if the hospitalist that took care of you is not available. Once you are discharged, your primary care physician will handle any further medical issues. Please note that NO REFILLS for any discharge medications will be authorized once you are discharged, as it is imperative that you return to your primary care physician (or establish a relationship with a primary care physician if you do not have one) for your aftercare needs so that they can reassess your need for medications and monitor your lab values. Today   SUBJECTIVE   Doing well dter in the room  VITAL SIGNS:  Blood pressure (!) 138/51, pulse 71, temperature 98.9 F (37.2 C), temperature source Oral, resp. rate 18, height _0  (1.6 m), weight 98.9 kg, SpO2 94 %.  I/O:    Intake/Output Summary (Last 24 hours) at 08/10/2018 0748 Last data filed at 08/10/2018 0427 Gross per 24 hour  Intake 1685.03 ml  Output 1200 ml  Net 485.03 ml    PHYSICAL EXAMINATION:  GENERAL:  84 y.o.-year-old patient lying in the bed with no acute distress. Weak, pallor EYES: Pupils equal, round, reactive to light and accommodation. No scleral icterus. Extraocular muscles intact.  HEENT: Head atraumatic, normocephalic. Oropharynx and nasopharynx clear.  NECK:  Supple, no jugular venous distention. No  thyroid enlargement, no tenderness.  LUNGS: Normal breath sounds bilaterally, no wheezing, rales,rhonchi or crepitation. No use of accessory muscles of respiration.  CARDIOVASCULAR: S1, S2 normal. No murmurs, rubs, or gallops.  ABDOMEN: Soft, non-tender, non-distended. Bowel sounds present. No organomegaly or mass.  EXTREMITIES: No pedal edema, cyanosis, or clubbing.  NEUROLOGIC: Cranial nerves II through XII are intact. Muscle strength 5/5 in all extremities.  Sensation intact. Gait not checked.  PSYCHIATRIC: The patient is alert and oriented x 3.  SKIN: No obvious rash, lesion, or ulcer.   DATA REVIEW:   CBC  Recent Labs  Lab 08/08/18 0311  08/09/18 1617 08/10/18 0306  WBC 8.4  --   --   --   HGB 8.3*   < > 8.4* 8.2*  HCT 25.7*  --  26.0*  --   PLT 130*  --   --   --    < > = values in this interval not displayed.    Chemistries  Recent Labs  Lab 08/10/18 0306  NA 136  K 3.6  CL 99  CO2 34*  GLUCOSE 109*  BUN 21  CREATININE 0.92  CALCIUM 8.0*    Microbiology Results   Recent Results (from the past 240 hour(s))  MRSA PCR Screening     Status: None   Collection Time: 08/07/18  1:16 AM  Result Value Ref Range Status   MRSA by PCR NEGATIVE NEGATIVE Final    Comment:        The GeneXpert MRSA Assay (FDA approved for NASAL specimens only), is one component of a comprehensive MRSA colonization surveillance program. It is not intended to diagnose MRSA infection nor to guide or monitor treatment for MRSA infections. Performed at [facility], [address] [place], [place] [address]     RADIOLOGY:  No results found.   Management plans discussed with the patient, family and they are in agreement.  CODE STATUS:     Code Status Orders  (From admission, onward)         Start     Ordered   08/07/18 0019  Full code  Continuous     08/07/18 0018        Code Status History    This patient has a current code status but no historical code  status.    Advance Directive Documentation     Most Recent Value  Type of Advance Directive  Healthcare Power of Attorney, Living will  Pre-existing out of facility DNR order (yellow form or pink MOST form)  -  "MOST" Form in Place?  -      TOTAL TIME TAKING CARE OF THIS PATIENT: *40* minutes.    Fritzi Mandes M.D on 08/10/2018 at 7:48 AM  Between 7am to 6pm - Pager - [phone] After 6pm go to [email or web address] - password [facility]  Office  [phone]  CC: Primary care physician; Einar Pheasant, MD

## 2018-08-10 NOTE — Progress Notes (Signed)
Pt unable to have BM after suppository and enema. MD Posey Pronto ok with pt still being discharged to SNF. EMS called for transport.

## 2018-08-10 NOTE — Clinical Social Work Placement (Signed)
   CLINICAL SOCIAL WORK PLACEMENT  NOTE  Date:  08/10/2018  Patient Details  Name: Kimberly Roy MRN: [id number] Date of Birth: 1931/06/12  Clinical Social Work is seeking post-discharge placement for this patient at the [place] level of care (*CSW will initial, date and re-position this form in  chart as items are completed):  Yes   Patient/family provided with [place] Work Department's list of facilities offering this level of care within the geographic area requested by the patient (or if unable, by the patient's family).  Yes   Patient/family informed of their freedom to choose among providers that offer the needed level of care, that participate in Medicare, Medicaid or managed care program needed by the patient, have an available bed and are willing to accept the patient.  Yes   Patient/family informed of [facility] [place]'s ownership interest in [facility] [facility] and [facility], as well as of the fact that they are under no obligation to receive care at these facilities.  PASRR submitted to EDS on       PASRR number received on       Existing PASRR number confirmed on 08/07/18     FL2 transmitted to all facilities in geographic area requested by pt/family on 08/07/18     FL2 transmitted to all facilities within larger geographic area on       Patient informed that his/her managed care company has contracts with or will negotiate with certain facilities, including the following:        Yes   Patient/family informed of bed offers received.  Patient chooses bed at [facility] )     Physician recommends and patient chooses bed at      Patient to be transferred to [facility] ) on 08/10/18.  Patient to be transferred to facility by [facility] )     Patient family notified on 08/10/18 of transfer.  Name of family member notified:  (CSW left patient's daughter [name] a Advertising account executive. )     PHYSICIAN       Additional Comment:      _______________________________________________ [name], Veronia Beets, LCSW 08/10/2018, 12:16 PM

## 2018-08-10 NOTE — Progress Notes (Signed)
Report called and given to [place] at [facility]. Waiting on pt to have BM and will call EMS for transport.

## 2018-08-10 NOTE — Care Management Important Message (Signed)
Important Message  Patient Details  Name: Kimberly Roy MRN: [id number] Date of Birth: May 14, 1931   Medicare Important Message Given:  Yes    [name] 08/10/2018, 10:30 AM

## 2018-08-10 NOTE — Evaluation (Signed)
Physical Therapy Treatment note Patient Details Name: Kimberly Roy MRN: [id number] DOB: 1930/10/07 Today's Date: 08/10/2018   History of Present Illness  presented to ER secondary to mechanical fall in home environment, immediate onset of R hip pain; admitted for peri-prosthetic distal femur fracture on R, status post ORIF (12/6), TDWB R LE.  Clinical Impression  Patient alert and oriented; eager for OOB to chair this date.  Able to complete with less physical assist than noted during previous session, continuous cuing for adherence to TTWB R LE.  Does fatigues quickly and demonstrates marked difficulty with hopping/stepping on L LE due to chronic wrist/shoulder troubles (and difficulty supporting weight on RW).  Patient very motivated/eager to participate/progress; anticipate consistent progress towards goals with continued therapy in STR setting.    Follow Up Recommendations SNF    Equipment Recommendations  Rolling walker with 5" wheels    Recommendations for Other Services       Precautions / Restrictions Precautions Precautions: Fall Restrictions Weight Bearing Restrictions: Yes RLE Weight Bearing: Touchdown weight bearing      Mobility  Bed Mobility Overal bed mobility: Needs Assistance Bed Mobility: Supine to Sit     Supine to sit: Min assist;Mod assist     General bed mobility comments: assist for R LE management, truncal elevation  Transfers Overall transfer level: Needs assistance Equipment used: Rolling walker (2 wheeled) Transfers: Sit to/from Stand Sit to Stand: Mod assist;+2 physical assistance         General transfer comment: assist for R LE TTWB, lift off and standing balance; difficulty with hand placement and UE WBing due to wrist injury/arthritis  Ambulation/Gait Ambulation/Gait assistance: Mod assist;+2 physical assistance Gait Distance (Feet): (3-4) Assistive device: Rolling walker (2 wheeled)       General Gait Details: 1-2 small 'hops'  with RW, mod assis t+2 for balance and support (assist to facilitate TTWB R LE); fatigues quickly and tends to 'scoot' foot across floor to complete OOB to chair transfer.  Fair balance; do recommend continued use of +2 for optimal safety at this time.  Anticipate additional gait distance to be limited significantly by WBing restsrictions to R LE  Stairs            Wheelchair Mobility    Modified Rankin (Stroke Patients Only)       Balance Overall balance assessment: Needs assistance Sitting-balance support: No upper extremity supported;Feet supported Sitting balance-Leahy Scale: Fair     Standing balance support: Bilateral upper extremity supported Standing balance-Leahy Scale: Poor                               Pertinent Vitals/Pain Pain Assessment: Faces Faces Pain Scale: Hurts little more Pain Location: R LE Pain Descriptors / Indicators: Aching Pain Intervention(s): Limited activity within patient's tolerance;Monitored during session;Repositioned    Home Living Family/patient expects to be discharged to:: Private residence Living Arrangements: Spouse/significant other Available Help at Discharge: Family;Personal care attendant;Available 24 hours/day Type of Home: House       Home Layout: One level        Prior Function Level of Independence: Independent with assistive device(s)         Comments: Pt used 4WW for fxl mobility, some light assistance/setup for bathing/dressing d/t limited LE ROM (hx OA and knee replacements) and back pain.   Has hired caregivers in the home 24/7 to assist with husband (previous CVA).  Family provides frequent check in  and support for community needs.     Hand Dominance        Extremity/Trunk Assessment   Upper Extremity Assessment Upper Extremity Assessment: (grossly 4-/5, chronic arthritic changes bilat shoulders; R UE with significant ecchymosis to R mid-humerus)    Lower Extremity Assessment Lower  Extremity Assessment: Generalized weakness(R LE grossly 3-/5, limited by pain; L LE grossly 4-/5 (limited knee ROM dur to previous surgery))       Communication   Communication: No difficulties  Cognition Arousal/Alertness: Awake/alert Behavior During Therapy: WFL for tasks assessed/performed Overall Cognitive Status: Within Functional Limits for tasks assessed                                        General Comments      Exercises Other Exercises Other Exercises: Sit/stand x3 with RW, mod assist +2 for hand placement, lift off and standing balance   Assessment/Plan    PT Assessment Patient needs continued PT services  PT Problem List Decreased strength;Decreased range of motion;Decreased activity tolerance;Decreased balance;Decreased mobility;Decreased knowledge of use of DME;Decreased safety awareness;Decreased knowledge of precautions;Decreased skin integrity;Pain       PT Treatment Interventions DME instruction;Gait training;Stair training;Functional mobility training;Therapeutic activities;Balance training;Neuromuscular re-education;Therapeutic exercise;Patient/family education    PT Goals (Current goals can be found in the Care Plan section)  Acute Rehab PT Goals Patient Stated Goal: to get rehab and get back on my feet PT Goal Formulation: With patient Time For Goal Achievement: 08/24/18 Potential to Achieve Goals: Good    Frequency BID   Barriers to discharge        Co-evaluation               AM-PAC PT "6 Clicks" Mobility  Outcome Measure Help needed turning from your back to your side while in a flat bed without using bedrails?: A Lot Help needed moving from lying on your back to sitting on the side of a flat bed without using bedrails?: A Lot Help needed moving to and from a bed to a chair (including a wheelchair)?: A Lot Help needed standing up from a chair using your arms (e.g., wheelchair or bedside chair)?: A Lot Help needed to walk  in hospital room?: A Lot Help needed climbing 3-5 steps with a railing? : Total 6 Click Score: 11    End of Session Equipment Utilized During Treatment: Gait belt Activity Tolerance: Patient tolerated treatment well Patient left: in chair;with call bell/phone within reach;with chair alarm set Nurse Communication: Mobility status PT Visit Diagnosis: Muscle weakness (generalized) (M62.81);History of falling (Z91.81);Difficulty in walking, not elsewhere classified (R26.2);Pain Pain - Right/Left: Right Pain - part of body: Hip    Time: 6578-4696 PT Time Calculation (min) (ACUTE ONLY): 23 min   Charges:     PT Treatments $Therapeutic Activity: 23-37 mins        Kalene Cutler H. Owens Shark, PT, DPT, NCS 08/10/18, 11:02 AM [phone]

## 2018-08-10 NOTE — Progress Notes (Signed)
Clinical Education officer, museum (CSW) met with patient and her daughter [name] was on speaker phone while CSW was in the room. CSW presented bed offers. Patient and daughter chose [place]. Health Team SNF authorization has been received, authorization # [id number]. Patient is medically stable for D/C to [facility] today. Per Seth Bake admissions coordinator at [facility] patient can come today to room 325. RN will call report at [phone] and arrange EMS for transport. CSW sent D/C orders to [facility] via [place]. Patient is aware that she will have a co-pay of $10-$20 per day days 1-20 at [facility]. Patient is also aware that she will have a co-pay of $140-$170 per day starting day 21. Patient reported that she would let her husband Therwell know about the discharge today and asked that CSW not call him. Please reconsult if future social work needs arise. CSW signing off.   [employer], LCSW [phone]

## 2018-08-10 NOTE — Telephone Encounter (Signed)
Copied from [place] [id number]. Topic: Appointment Scheduling - Scheduling Inquiry for Clinic >> Aug 10, 2018  9:37 AM Nils Flack wrote: Reason for CRM: pt needs to have hospital follow up within 1 week. No appts are available for Dr Nicki Reaper in that time. Please call [phone] ask for Manuela Schwartz to schedule >> Aug 10, 2018  4:09 PM Virl Axe D wrote: Manuela Schwartz from [facility] following up about hospital F/U for pt. Please advise

## 2018-08-10 NOTE — Progress Notes (Signed)
This nurse spoke with Paulina at [facility] to let her know pt unable to have BM. Nurse is ok with information.

## 2018-08-10 NOTE — Progress Notes (Addendum)
Late-entry cancellation note by Shelton Silvas, PT, applicable to treatment date 08/09/18 Riley Hallum H. Owens Shark, PT, DPT, NCS 08/10/18, 9:20 AM [phone]    Physical Therapy Treatment Patient Details Name: Kimberly Roy MRN: [id number] DOB: [birth year] Today's Date: 08/10/2018    History of Present Illness      PT Comments    Checked with patient in am, recieving blood transfusion. Checked back in afternoon; held per nursing for continuing transfusion  Follow Up Recommendations        Equipment Recommendations       Recommendations for Other Services       Precautions / Restrictions      Mobility  Bed Mobility                  Transfers                    Ambulation/Gait                 Stairs             Wheelchair Mobility    Modified Rankin (Stroke Patients Only)       Balance                                            Cognition                                              Exercises      General Comments        Pertinent Vitals/Pain      Home Living                      Prior Function            PT Goals (current goals can now be found in the care plan section)      Frequency           PT Plan      Co-evaluation              AM-PAC PT "6 Clicks" Mobility   Outcome Measure                   End of Session               Time:  -     Charges:                       Shelton Silvas PT, DPT   Shelton Silvas 08/10/2018, 8:28 AM

## 2018-08-10 NOTE — Progress Notes (Signed)
PT Cancellation Note  Patient Details Name: Kimberly Roy MRN: [id number] DOB: 1930/10/18   Cancelled Treatment:    Reason Eval/Treat Not Completed: Patient at procedure or test/unavailable(Treatment session attempted.  Patient currently with CNA for ADL and patient care activities.  Will re-attempt at later time/date as medically appropriate and available.)   [name] H. Owens Shark, PT, DPT, NCS 08/10/18, 9:45 AM [phone]

## 2018-08-11 DIAGNOSIS — K219 Gastro-esophageal reflux disease without esophagitis: Secondary | ICD-10-CM | POA: Diagnosis not present

## 2018-08-11 DIAGNOSIS — I10 Essential (primary) hypertension: Secondary | ICD-10-CM | POA: Diagnosis not present

## 2018-08-11 DIAGNOSIS — M545 Low back pain: Secondary | ICD-10-CM | POA: Diagnosis not present

## 2018-08-11 DIAGNOSIS — I5032 Chronic diastolic (congestive) heart failure: Secondary | ICD-10-CM | POA: Diagnosis not present

## 2018-08-11 DIAGNOSIS — S72401A Unspecified fracture of lower end of right femur, initial encounter for closed fracture: Secondary | ICD-10-CM | POA: Diagnosis not present

## 2018-08-11 DIAGNOSIS — F1120 Opioid dependence, uncomplicated: Secondary | ICD-10-CM | POA: Diagnosis not present

## 2018-08-11 NOTE — Addendum Note (Signed)
Addendum  created 08/11/18 1316 by Doreen Salvage, CRNA   Charge Capture section accepted

## 2018-08-11 NOTE — Telephone Encounter (Signed)
Kimberly Roy from [facility] is calling in requesting a order for dressing change.  Fax# [phone]

## 2018-08-11 NOTE — Telephone Encounter (Signed)
Please advise?

## 2018-08-12 NOTE — Telephone Encounter (Signed)
Patient is being followed by Dr Silvio Pate at [facility] until she is d/c'd

## 2018-08-13 LAB — TYPE AND SCREEN
ABO/RH(D): O POS
Antibody Screen: POSITIVE
Donor AG Type: NEGATIVE
UNIT DIVISION: 0
Unit division: 0

## 2018-08-13 LAB — BPAM RBC
Blood Product Expiration Date: 201912312359
Blood Product Expiration Date: 201912312359
ISSUE DATE / TIME: 201912081217
ISSUE DATE / TIME: 201912111742
Unit Type and Rh: 5100
Unit Type and Rh: 5100

## 2018-08-17 LAB — ABO/RH: ABO/RH(D): O POS

## 2018-08-21 ENCOUNTER — Telehealth: Payer: Self-pay | Admitting: *Deleted

## 2018-08-21 NOTE — Telephone Encounter (Signed)
I can do the TCM on this patient but I need a place to schedule.

## 2018-08-21 NOTE — Telephone Encounter (Signed)
Copied from [place] [id number]. Topic: Appointment Scheduling - Scheduling Inquiry for Clinic >> Aug 21, 2018  3:27 PM Virl Axe D wrote: Reason for CRM: [facility] called to set up follow up appt for pt with Dr. Nicki Reaper. She will be released on 08/24/18 Dr. Nicki Reaper unavailable until March. Pt has appt scheduled for 10/15/18. Please advise

## 2018-08-24 DIAGNOSIS — M80051A Age-related osteoporosis with current pathological fracture, right femur, initial encounter for fracture: Secondary | ICD-10-CM | POA: Diagnosis not present

## 2018-08-25 ENCOUNTER — Other Ambulatory Visit: Payer: Self-pay | Admitting: *Deleted

## 2018-08-25 NOTE — Patient Outreach (Signed)
[facility]) Care Management  08/25/2018  Kimberly Roy 1930-09-03 [id number]   Spoke with discharge [place] at [facility].  Seth Bake stated patient would benefit from [place] Management services at discharge.  Patient cares for mentally disabled son and also has a husband who is in the home with health issues which requires home health.  Patient recently fell which resulted in a right femur fracture.   Attempted to visit patient at [place] facility but patient had already discharged to home.   Referral placed for [place] manager per discharge planners request for services.   Plan to make [facility] UM team member aware referral for [facility] CM placed.   Rutherford Limerick RN, BSN [place] Acute Care Coordinator [phone]) Business Mobile [phone]) Toll free office

## 2018-08-27 ENCOUNTER — Telehealth: Payer: Self-pay

## 2018-08-27 DIAGNOSIS — S72341D Displaced spiral fracture of shaft of right femur, subsequent encounter for closed fracture with routine healing: Secondary | ICD-10-CM | POA: Diagnosis not present

## 2018-08-27 NOTE — Telephone Encounter (Signed)
Copied from [place] [id number]. Topic: Appointment Scheduling - Scheduling Inquiry for Clinic >> Aug 21, 2018  3:27 PM Virl Axe D wrote: Reason for CRM: [facility] called to set up follow up appt for pt with Dr. Nicki Reaper. She will be released on 08/24/18 Dr. Nicki Reaper unavailable until March. Pt has appt scheduled for 10/15/18. Please advise >> Aug 27, 2018 12:17 PM Alanda Slim E wrote: Pt called in to check on getting on Dr. Bary Leriche schedule since needing a follow up appt from [facility]. / please call back and advise

## 2018-08-27 NOTE — Telephone Encounter (Signed)
Lm to call back

## 2018-08-27 NOTE — Telephone Encounter (Signed)
See other phone note  Lm to call back

## 2018-08-28 ENCOUNTER — Encounter: Payer: Self-pay | Admitting: *Deleted

## 2018-08-28 ENCOUNTER — Other Ambulatory Visit: Payer: Self-pay | Admitting: *Deleted

## 2018-08-28 NOTE — Patient Outreach (Signed)
[facility]) Care Management [facility] CM Telephone Outreach, Transition of Care day # 1 Post- SNF discharge day # 5  08/28/2018  Kimberly Roy [birth year] [id number]  Transition of Care call outreach attempt to Kimberly Roy, 85 y/o female referred to [place] by [facility] PAC on 08/25/18; PAC received original referral/ notification of patient discharge from SNF to home after recent SNF rehabilitation visit post-hospital visit/ discharge December 6-9, 2019 for mechanical fall resulting in (R) femur/ hip fracture requiring ORIF with post-operative anemia.  Patient has history including, but not limited to, HTN/ HLD; CHF; OSA/ pulmonary HTN, on O2 2 L/min via [place] q HS; GERD.  1:10 pm: Successful telephone outreach to person identifying herself as patient's daughter, "[name];" [name] provides HIPAA identifiers for patient's name and date of birth, and immediately requests that I re-attempt call to them another time, as they are currently busy; explained to daughter that I would re-attempt call later today if possible, or early next week; daughter agreeable.  1:50 pm:  Attempted second outreach to patient and was able to successfully connect with her; HIPAA/ identity verified and [place] services were discussed with patient; patient provides verbal consent for [place] services.    Today, patient reports that she is "doing as good as can be expected," and she reports ongoing pain in operative site/ (R) leg; states pain is well-managed with prescribed medications, but that pain relief from medication "doesn't last long."  Denies new/ recent falls post- SNF discharge.  Patient sounds to be in no obvious or apparent distress throughout 25 minute phone call today.  Patient further reports:  Medications: -- Has all medicationsand takes as prescribed;denies questions about current medications, states that her daughter [name] is a Software engineer and helps as needed with  medications.  -- self-manage medications using weekly pill planner box, states that she has not needed assistance from her pharmacist daughter in [place] medications, but that daughter is available and willing to help if needed. -- denies issues with swallowing medications -- declines medication review today, as she is "uncomfortable and not near medications at time of our call; discussed need for prompt medication review and patient is agreeable to this at time of next scheduled phone outreach  Home health [facility]) services: -- [place] services for PT, OT, RN in place through [facility]); states that this [facility] team has been visiting her husband regularly at home as he recently had a stroke; stated that she has contacted home health team to establish services for herself; reports has not yet heard back from [facility] team, but "knows they will call soon."  Declines need for my assistance in establishing home health services.  Advised patient to contact [facility] agency office on Monday 08/31/18 if she has not heard from [facility] team, and provided office phone number for [place] office to patient -- encouraged patient's active participation in home health services as ordered post- SNF discharge; patient verblaizes understanding and agreement   Provider appointments: -- All upcoming provider appointments were reviewed with patient today; noted that patient does not yet have scheduled post-SNF discharge office visit with PCP, although she does have annual wellness visit scheduled in early January; patient reports that she has contacted her PCP office to schedule post- SNF discharge office visit and is waiting to hear back from PCP team with appointment options; declines need for my assistance around scheduling this appointment  Safety/ Mobility/ Falls: -- denies new/ recent falls post-hospital/  SNF discharges; reports one fall that resulted in her recent hospitalization/ SNF visit; stated she never used assistive  devices and is "not sure" why she fell in early December; states that 'for some reason" she "just lost her balance" -- assistive devices: currently wheelchair bound post- SNF discharge with restrictions on weight bearing; reports has cane/ walker and wheelchair in her home, and endorses "using al the time;" states she is "managing fairly well" maneuvering wheelchair in home with assistance of family members -- general fall risks/ prevention education discussed with patient today  Social/ Community Resource needs: -- currently denies community resource needs, stating supportive family members that assist with care needs as indicated: reports pharmacist daughter "[name]" has taken FMLA and is staying with her and her husband "indefinitely" and assisting with care needs as indicated -- family provides transportation for patient to all provider appointments, errands, etc  Advanced Directive (AD) Planning:   --reports does currently have exisisting AD in place for HCPOA and Living Will and declines desire to make changes to ethert.  Patient stated that she needed to get off phone to reposition herself and she denies further issues, concerns, or problems today.   Provided and confirmed that patient has my direct phone number, the main [facility] CM office phone number, and the [facility] CM 24-hour nurse advice phone number should issues arise prior to next scheduled [place] outreach by phone next week, and encouraged patient to contact me directly if needs, questions, issues, or concerns arise prior to next scheduled outreach; patient agreed to do so.  Plan:  Patient will take medications as prescribed and will attend all scheduled provider appointments  Patient will promptly schedule post-hospital discharge office visit with PCP  Patient will promptly notify care providers for any new concerns/ issues/ problems that arise  Patient will actively participate in home health services as ordered post-hospital  discharge  I will make patien't PCP aware of [place] RN CM involvement in patient's care-- will send barriers letter  [place] outreach for ongoing transition of care to continue with scheduled phone call next week  [facility] CM Care Plan Problem One     Most Recent Value  Care Plan Problem One  High Risk for hospital readmission as evidenced by recent hospital visit December 6-9, 2019 for mechanical fall with subsequent hip/ femur fracture, with discharge to SNF for rehabilitation  Role Documenting the Problem One  Care Management [place] for Problem One  Active  [facility] Long Term Goal   Over the next 31 days, patient will not experience hospital readmission as evidenced by patient reporting and review of EMR during [facility] RN CCM outreach  [place] Term Goal Start Date  08/28/18  Interventions for Problem One Long Term Goal  Discussed with patient current clinical condition post- SNF discharge,  discussed [place] CM program and initiated transition of care program,  confirmed that patient does not have current clinical concerns post- recent SNF discharge   [facility] CM Short Term Goal #1   Over the next 30 days, patient will actively participate in home health services as ordered post- SNF discharge, as evidenced by patient reporting and collaboration with home health team as indicated during [place] outreach  [facility] CM Short Term Goal #1 Start Date  08/28/18  Interventions for Short Term Goal #1  Confirmed that patient has home health team's contact information and that she has reached out to home health team and is waiting  call back,  discussed difference between home health services and [place] services,  provided office phone number for home health agency to patient  [facility] CM Short Term Goal #2   Over the next 30 days, patient will attend all scheduled provider office visits, as evidenced by patient reporitng and review of EMR/ collaboration with providers as indicated  during [place] outreach  [facility] CM Short Term Goal #2 Start Date  08/28/18  Interventions for Short Term Goal #2  Discussed with patient value of prompt post- SNF PCP office visit and confirmed that she has contacted PCP office to schedule,  reviewed already scheduled provider office visits with patient and confirmed that she has reliable transportation to all scheduled provider visits along with plans ot attend    [facility] CM Care Plan Problem Two     Most Recent Value  Care Plan Problem Two  High fall risk related to/ as evidenced by recent fall with injury and subsequent mobility restrictions requiring patient to be in wheelchair  Role Documenting the Problem Two  Care Management [place] for Problem Two  Active  Interventions for Problem Two Long Term Goal   Discussed patient's current mobility restrictions with her post SNF discharge and confirmed that she is using assistive devices as intructed post- SNF discharge,  encouraged her to actively participate in home health PT services and using teach back method, discussed fall risks and prevention with patient  [facility] Long Term Goal  Over the next 45 days, patient will use assistive devices as indicated and instructed post- recent ORIF due to hip/ femur fracture, as evidenced by patient reporting during [facility] CCM outreach  [facility] Long Term Goal Start Date  08/28/18     I appreciate the opportunity to participate in Eltha's care,  Oneta Rack, RN, BSN, [employer] Coordinator [facility] Care Management  [phone]

## 2018-08-29 DIAGNOSIS — G8929 Other chronic pain: Secondary | ICD-10-CM | POA: Diagnosis not present

## 2018-08-29 DIAGNOSIS — Z79891 Long term (current) use of opiate analgesic: Secondary | ICD-10-CM | POA: Diagnosis not present

## 2018-08-29 DIAGNOSIS — F329 Major depressive disorder, single episode, unspecified: Secondary | ICD-10-CM | POA: Diagnosis not present

## 2018-08-29 DIAGNOSIS — Z9981 Dependence on supplemental oxygen: Secondary | ICD-10-CM | POA: Diagnosis not present

## 2018-08-29 DIAGNOSIS — Z96653 Presence of artificial knee joint, bilateral: Secondary | ICD-10-CM | POA: Diagnosis not present

## 2018-08-29 DIAGNOSIS — F419 Anxiety disorder, unspecified: Secondary | ICD-10-CM | POA: Diagnosis not present

## 2018-08-29 DIAGNOSIS — E039 Hypothyroidism, unspecified: Secondary | ICD-10-CM | POA: Diagnosis not present

## 2018-08-29 DIAGNOSIS — I872 Venous insufficiency (chronic) (peripheral): Secondary | ICD-10-CM | POA: Diagnosis not present

## 2018-08-29 DIAGNOSIS — D649 Anemia, unspecified: Secondary | ICD-10-CM | POA: Diagnosis not present

## 2018-08-29 DIAGNOSIS — G4733 Obstructive sleep apnea (adult) (pediatric): Secondary | ICD-10-CM | POA: Diagnosis not present

## 2018-08-29 DIAGNOSIS — M545 Low back pain: Secondary | ICD-10-CM | POA: Diagnosis not present

## 2018-08-29 DIAGNOSIS — M5030 Other cervical disc degeneration, unspecified cervical region: Secondary | ICD-10-CM | POA: Diagnosis not present

## 2018-08-29 DIAGNOSIS — E785 Hyperlipidemia, unspecified: Secondary | ICD-10-CM | POA: Diagnosis not present

## 2018-08-29 DIAGNOSIS — I272 Pulmonary hypertension, unspecified: Secondary | ICD-10-CM | POA: Diagnosis not present

## 2018-08-29 DIAGNOSIS — I110 Hypertensive heart disease with heart failure: Secondary | ICD-10-CM | POA: Diagnosis not present

## 2018-08-29 DIAGNOSIS — K219 Gastro-esophageal reflux disease without esophagitis: Secondary | ICD-10-CM | POA: Diagnosis not present

## 2018-08-29 DIAGNOSIS — S72341D Displaced spiral fracture of shaft of right femur, subsequent encounter for closed fracture with routine healing: Secondary | ICD-10-CM | POA: Diagnosis not present

## 2018-08-29 DIAGNOSIS — I890 Lymphedema, not elsewhere classified: Secondary | ICD-10-CM | POA: Diagnosis not present

## 2018-08-29 DIAGNOSIS — Z86718 Personal history of other venous thrombosis and embolism: Secondary | ICD-10-CM | POA: Diagnosis not present

## 2018-08-29 DIAGNOSIS — I5032 Chronic diastolic (congestive) heart failure: Secondary | ICD-10-CM | POA: Diagnosis not present

## 2018-08-29 DIAGNOSIS — K760 Fatty (change of) liver, not elsewhere classified: Secondary | ICD-10-CM | POA: Diagnosis not present

## 2018-08-30 DIAGNOSIS — F329 Major depressive disorder, single episode, unspecified: Secondary | ICD-10-CM | POA: Diagnosis not present

## 2018-08-30 DIAGNOSIS — F419 Anxiety disorder, unspecified: Secondary | ICD-10-CM | POA: Diagnosis not present

## 2018-08-30 DIAGNOSIS — Z96653 Presence of artificial knee joint, bilateral: Secondary | ICD-10-CM | POA: Diagnosis not present

## 2018-08-30 DIAGNOSIS — I272 Pulmonary hypertension, unspecified: Secondary | ICD-10-CM | POA: Diagnosis not present

## 2018-08-30 DIAGNOSIS — D649 Anemia, unspecified: Secondary | ICD-10-CM | POA: Diagnosis not present

## 2018-08-30 DIAGNOSIS — I872 Venous insufficiency (chronic) (peripheral): Secondary | ICD-10-CM | POA: Diagnosis not present

## 2018-08-30 DIAGNOSIS — S72341D Displaced spiral fracture of shaft of right femur, subsequent encounter for closed fracture with routine healing: Secondary | ICD-10-CM | POA: Diagnosis not present

## 2018-08-30 DIAGNOSIS — K760 Fatty (change of) liver, not elsewhere classified: Secondary | ICD-10-CM | POA: Diagnosis not present

## 2018-08-30 DIAGNOSIS — I5032 Chronic diastolic (congestive) heart failure: Secondary | ICD-10-CM | POA: Diagnosis not present

## 2018-08-30 DIAGNOSIS — E785 Hyperlipidemia, unspecified: Secondary | ICD-10-CM | POA: Diagnosis not present

## 2018-08-30 DIAGNOSIS — K219 Gastro-esophageal reflux disease without esophagitis: Secondary | ICD-10-CM | POA: Diagnosis not present

## 2018-08-30 DIAGNOSIS — G4733 Obstructive sleep apnea (adult) (pediatric): Secondary | ICD-10-CM | POA: Diagnosis not present

## 2018-08-30 DIAGNOSIS — I110 Hypertensive heart disease with heart failure: Secondary | ICD-10-CM | POA: Diagnosis not present

## 2018-08-30 DIAGNOSIS — Z86718 Personal history of other venous thrombosis and embolism: Secondary | ICD-10-CM | POA: Diagnosis not present

## 2018-08-30 DIAGNOSIS — I890 Lymphedema, not elsewhere classified: Secondary | ICD-10-CM | POA: Diagnosis not present

## 2018-08-30 DIAGNOSIS — Z79891 Long term (current) use of opiate analgesic: Secondary | ICD-10-CM | POA: Diagnosis not present

## 2018-08-30 DIAGNOSIS — E039 Hypothyroidism, unspecified: Secondary | ICD-10-CM | POA: Diagnosis not present

## 2018-08-30 DIAGNOSIS — Z9981 Dependence on supplemental oxygen: Secondary | ICD-10-CM | POA: Diagnosis not present

## 2018-08-30 DIAGNOSIS — M5030 Other cervical disc degeneration, unspecified cervical region: Secondary | ICD-10-CM | POA: Diagnosis not present

## 2018-08-30 DIAGNOSIS — G8929 Other chronic pain: Secondary | ICD-10-CM | POA: Diagnosis not present

## 2018-08-30 DIAGNOSIS — M545 Low back pain: Secondary | ICD-10-CM | POA: Diagnosis not present

## 2018-08-31 ENCOUNTER — Other Ambulatory Visit: Payer: Self-pay | Admitting: *Deleted

## 2018-08-31 NOTE — Patient Outreach (Signed)
[facility]) Care Management [facility] CM EMMI Red-Flag notification 08/31/2018  Kimberly Roy Aug 01, 1931 [id number]   Received EMMI red-flag notification for EMMI automated call placed Friday 08/28/18:  Discharge instructions/ follow up care, re:   Kimberly Roy, 84 y/o female referred to [place] by [facility] PAC on 08/25/18; PAC received original referral/ notification of patient discharge from SNF to home after recent SNF rehabilitation visit post-hospital visit/ discharge December 6-9, 2019 for mechanical fall resulting in (R) femur/ hip fracture requiring ORIF with post-operative anemia.  Patient has history including, but not limited to, HTN/ HLD; CHF; OSA/ pulmonary HTN, on O2 2 L/min via [place] q HS; GERD.  I had spoken with patient on same day of EMMI automated call Friday 08/28/18 and confirmed with patient at that time that she understands discharge instructions and has contacted her PCP for post-hospital discharge follow appointment.  Plan:  Call today deferred, as I spoke with patient on same day of EMMI call 08/28/18   Will continue transition of care outreach as previously scheduled post- call to patient on August 28, 2018  Oneta Rack, RN, BSN, [employer] Coordinator [facility] Care Management  [phone]

## 2018-09-01 ENCOUNTER — Other Ambulatory Visit: Payer: Self-pay | Admitting: Internal Medicine

## 2018-09-03 NOTE — Telephone Encounter (Signed)
LMTCB to see if patient has d/c this resumed taking it?

## 2018-09-04 ENCOUNTER — Other Ambulatory Visit: Payer: Self-pay | Admitting: *Deleted

## 2018-09-04 ENCOUNTER — Encounter: Payer: Self-pay | Admitting: *Deleted

## 2018-09-04 NOTE — Patient Outreach (Signed)
[facility]) Care Management [facility] CM Telephone Outreach, Transition of Care day 8 Post- SNF discharge day # 12 09/04/2018  Kimberly Roy    03-06-1931 [id number]  Successful telephone outreach to Kimberly Roy, 85 y/o female referred to [place] by [facility] PAC on 08/25/18; PAC received original referral/ notification of patient discharge from SNF to home after recent SNF rehabilitation visit post-hospital visit/ discharge December 6-9, 2019 for mechanical fall resulting in (R) femur/ hip fracture requiring ORIF with post-operative anemia.  Patient has history including, but not limited to, HTN/ HLD; CHF; OSA/ pulmonary HTN, on O2 2 L/min via [place] q HS; GERD.  HIPAA/ identity verified.  Today, patient reports that she is "doing okay," and states that her husband was re-hospitalized with another stroke on New Years day; patient is worried about him- emotional support provided.  Reports that her daughter continues staying at her home, and reports that she thinks she "is doing better, overall," other than being worried about her husband.  Patient denies pain today and also denies new/ recent falls- states that she is continuing to use wheelchair primarily but also has a cane and walker.  Patient sounds to be in no distress throughout phone call today.  Patient further reports:  Medications: -- Has all medicationsand takes as prescribed;denies questions/ concerns, recent changes around current medications, again declines medication review, stating that her pharmacist/ daughter Kimberly Roy continues assisting with medications as needed and that she that she is too upset about her husband's new stroke today to go through her medications; she assures me that she has no concerns around medications  Home health [facility]) services: -- [place] services for PT, OT, RN continue through [facility]); states that [facility] team has visited her several times post- SNF discharge and that she "got a  great report" from home health PT this week, stating, "she told me she was impressed that I could do as much as I could."  Encouraged patient's ongoing active participation in all [facility] services, and she verbalizes agreement -- patient reports today that she has a private duty CNA that assists her in care for ADL's regularly  Provider appointments: -- All upcoming provider appointments were reviewed with patient today; patient reports plans to attend all, despite her husband being in hospital; states that either her daughter or her private duty CNA will provide transportation  Patient denies further issues, concerns, or problems today.  Provided and confirmed that patient hasmy direct phone number, the main [facility] CM office phone number, and the [facility] CM 24-hour nurse advice phone number should issues arise prior to next scheduled [place] outreach by phone next week, and encouraged patient to contact me directly if needs, questions, issues, or concerns arise prior to next scheduled outreach; patient agreed to do so.  Plan:  Patient will take medications as prescribed and will attend all scheduled provider appointments  Patient will promptly notify care providers for any new concerns/ issues/ problems that arise  Patient will actively participate in home health services as ordered post-hospital discharge  [place] outreach for ongoing transition of care to continue with scheduled phone call next week  [facility] CM Care Plan Problem One     Most Recent Value  Care Plan Problem One  High Risk for hospital readmission as evidenced by recent hospital visit December 6-9, 2019 for mechanical fall with subsequent hip/ femur fracture, with discharge to SNF for rehabilitation  Role Documenting the Problem One  Care Management Coordinator  Care Plan for Problem One  Active  [facility] Long Term Goal   Over the next 31 days, patient will not experience hospital readmission as evidenced by patient  reporting and review of EMR during [facility] RN CCM outreach  [place] Term Goal Start Date  08/28/18  Interventions for Problem One Long Term Goal  Discussed with patient current clinical condition and confirmed that she has not current concerns around her clinical condition,  encouraged patient to promptly notify care providers for any new concerns/ issues/ problems that might arise  [facility] CM Short Term Goal #1   Over the next 30 days, patient will actively participate in home health services as ordered post- SNF discharge, as evidenced by patient reporting and collaboration with home health team as indicated during [place] CCM outreach  [facility] CM Short Term Goal #1 Start Date  08/28/18  Interventions for Short Term Goal #1  Confirmed that home health services have arrived and that patient is actively participating in all disciplines as ordered post- SNF discharge,  confirmed that patient has contact information for home health team,  encouraged her ongoing active participation in home health services  [facility]N CM Short Term Goal #2   Over the next 30 days, patient will attend all scheduled provider office visits, as evidenced by patient reporitng and review of EMR/ collaboration with providers as indicated during [facility] RN CCM outreach  [facility] CM Short Term Goal #2 Start Date  08/28/18  Interventions for Short Term Goal #2  Reviewed upcoming provider appointmnets with patient and confirmed that she has no concerns around transportation and verbalizes plans to attend    [facility] CM Care Plan Problem Two     Most Recent Value  Care Plan Problem Two  High fall risk related to/ as evidenced by recent fall with injury and subsequent mobility restrictions requiring patient to be in wheelchair  Role Documenting the Problem Two  Care Management Coordinator  Care Plan for Problem Two  Active  Interventions for Problem Two Long Term Goal   Confirmed that patient has experienced no recent or new falls post- SNF discharge and continues  using assistive devices as instructed post- SNF discharge,  confirmed that she is actively participating in home health services for PT post SNF discharge,  reiterated fall risks and prevention education as previously discussed with patient  THN Long Term Goal  Over the next 45 days, patient will use assistive devices as indicated and instructed post- recent ORIF due to hip/ femur fracture, as evidenced by patient reporting during [facility] RN CCM outreach  [facility] Long Term Goal Start Date  08/28/18     Oneta Rack, RN, BSN, [employer] Coordinator [facility] Care Management  [phone]

## 2018-09-07 ENCOUNTER — Telehealth: Payer: Self-pay

## 2018-09-07 NOTE — Telephone Encounter (Signed)
Patient scheduled for 09/10/18, that is what patient was calling about FYI .

## 2018-09-07 NOTE — Telephone Encounter (Signed)
Noted

## 2018-09-07 NOTE — Telephone Encounter (Signed)
Copied from [place] [id number]. Topic: General - Other >> Sep 07, 2018 11:12 AM Yvette Rack wrote: Reason for CRM: pt calling wanting to speak with Dr Nicki Reaper assistant only about a an appt pt states that she didn't want me to help

## 2018-09-08 ENCOUNTER — Other Ambulatory Visit: Payer: PPO

## 2018-09-10 ENCOUNTER — Encounter: Payer: Self-pay | Admitting: *Deleted

## 2018-09-10 ENCOUNTER — Ambulatory Visit (INDEPENDENT_AMBULATORY_CARE_PROVIDER_SITE_OTHER): Payer: PPO | Admitting: Internal Medicine

## 2018-09-10 ENCOUNTER — Encounter: Payer: Self-pay | Admitting: Internal Medicine

## 2018-09-10 ENCOUNTER — Other Ambulatory Visit: Payer: Self-pay | Admitting: *Deleted

## 2018-09-10 DIAGNOSIS — G4733 Obstructive sleep apnea (adult) (pediatric): Secondary | ICD-10-CM | POA: Diagnosis not present

## 2018-09-10 DIAGNOSIS — K5909 Other constipation: Secondary | ICD-10-CM

## 2018-09-10 DIAGNOSIS — M810 Age-related osteoporosis without current pathological fracture: Secondary | ICD-10-CM

## 2018-09-10 DIAGNOSIS — I872 Venous insufficiency (chronic) (peripheral): Secondary | ICD-10-CM

## 2018-09-10 DIAGNOSIS — D649 Anemia, unspecified: Secondary | ICD-10-CM

## 2018-09-10 DIAGNOSIS — M7989 Other specified soft tissue disorders: Secondary | ICD-10-CM | POA: Diagnosis not present

## 2018-09-10 DIAGNOSIS — R1084 Generalized abdominal pain: Secondary | ICD-10-CM | POA: Diagnosis not present

## 2018-09-10 DIAGNOSIS — E039 Hypothyroidism, unspecified: Secondary | ICD-10-CM | POA: Diagnosis not present

## 2018-09-10 DIAGNOSIS — I10 Essential (primary) hypertension: Secondary | ICD-10-CM

## 2018-09-10 DIAGNOSIS — E785 Hyperlipidemia, unspecified: Secondary | ICD-10-CM

## 2018-09-10 DIAGNOSIS — M5440 Lumbago with sciatica, unspecified side: Secondary | ICD-10-CM

## 2018-09-10 DIAGNOSIS — G8929 Other chronic pain: Secondary | ICD-10-CM

## 2018-09-10 DIAGNOSIS — K219 Gastro-esophageal reflux disease without esophagitis: Secondary | ICD-10-CM | POA: Diagnosis not present

## 2018-09-10 DIAGNOSIS — S7291XD Unspecified fracture of right femur, subsequent encounter for closed fracture with routine healing: Secondary | ICD-10-CM | POA: Diagnosis not present

## 2018-09-10 DIAGNOSIS — I272 Pulmonary hypertension, unspecified: Secondary | ICD-10-CM

## 2018-09-10 LAB — BASIC METABOLIC PANEL
BUN: 18 mg/dL (ref 6–23)
CO2: 31 mEq/L (ref 19–32)
Calcium: 9.5 mg/dL (ref 8.4–10.5)
Chloride: 102 mEq/L (ref 96–112)
Creatinine, Ser: 0.84 mg/dL (ref 0.40–1.20)
GFR: 68.4 mL/min (ref >60.00)
GLUCOSE: 83 mg/dL (ref 70–99)
Potassium: 4.9 mEq/L (ref 3.5–5.1)
Sodium: 139 mEq/L (ref 135–145)

## 2018-09-10 LAB — LIPID PANEL
Cholesterol: 117 mg/dL (ref 0–200)
HDL: 41.4 mg/dL (ref >39.00)
LDL Cholesterol: 57 mg/dL (ref 0–99)
NonHDL: 75.25
Total CHOL/HDL Ratio: 3
Triglycerides: 91 mg/dL (ref 0.0–149.0)
VLDL: 18.2 mg/dL (ref 0.0–40.0)

## 2018-09-10 LAB — TSH: TSH: 2.87 u[IU]/mL (ref 0.35–4.50)

## 2018-09-10 LAB — HEPATIC FUNCTION PANEL
ALT: 9 U/L (ref 0–35)
AST: 12 U/L (ref 0–37)
Albumin: 3.5 g/dL (ref 3.5–5.2)
Alkaline Phosphatase: 192 U/L — ABNORMAL HIGH (ref 39–117)
BILIRUBIN DIRECT: 0.1 mg/dL (ref 0.0–0.3)
Total Bilirubin: 0.3 mg/dL (ref 0.2–1.2)
Total Protein: 6.5 g/dL (ref 6.0–8.3)

## 2018-09-10 NOTE — Patient Outreach (Signed)
[facility]) Care Management [facility] CM Telephone Outreach, Transition of Care day 14 Post- SNF discharge day # 18 Unsuccessful outreach attempt # 1- engaged patient 09/10/2018  [name] 18-Sep-1930 [id number]  Unsuccessful telephone outreach to Kimberly Roy, 85 y/o female [facility] [place] [facility]18; PAC received original referral/ notification of patient discharge from SNF to home after recent SNF rehabilitation visit post-hospital visit/ discharge December 6-9, 2019 for mechanical fall resulting in (R) femur/ hip fracture requiring ORIF with post-operative anemia.Patient has history including, but not limited to, HTN/ HLD; CHF; OSA/ pulmonary HTN, on O2 2 L/min via [place] q HS; GERD.  HIPAA compliant voice mail message left for patient, requesting return call back.  Plan:  Will re-attempt [place] telephone outreach within 4 business days if I do not hear back from patient first  Oneta Rack, RN, BSN, [employer] Coordinator [facility] Care Management  [phone]

## 2018-09-10 NOTE — Progress Notes (Signed)
Patient ID: Kimberly Roy, female   DOB: [birth year], 85 y.o.   MRN: [id number]   Subjective:    Patient ID: Kimberly Roy, female    DOB: May 28, 1931, 85 y.o.   MRN: [id number]  HPI  Patient here for a scheduled follow up.  She is accompanied by her daughter and her husband.  History obtained from her and her daughter.  She recently suffered a right distal femur fracture.  S/p ORIF 08/07/18.  Non weight bearing on right leg.  Planning physical therapy at home.  Some increased fatigue.  No chest pain.  Breathing stable.  Still with some increased gas.  Some difficulty with bowel movements.  Not taking her stool softeners and miralax daily.  Plans to start.  Drinking warm prune juice.  Used an enema recently.  Had bm this am.  Saw GI for increased gas and bloating.  Was scheduled for colonoscopy.  Had to cancel secondary to recent fracture.  Plans to f/u with GI.  States blood pressures averaging 130/60s.  Handling stress with her husband's health issues.  Has caretakers coming into the home.  Daughter staying with them at night.     Past Medical History:  Diagnosis Date  . Anemia   . Anxiety   . Chest pain   . CHF (congestive heart failure) ([place])   . Constipation   . DDD (degenerative disc disease), cervical   . Depression   . DVT (deep venous thrombosis) ([place])   . Dysphonia   . Dyspnea   . Fatty liver   . Fatty liver   . Headache   . Hyperlipidemia   . Hyperpiesia   . Hypertension   . Hypothyroidism   . Interstitial cystitis   . Left ventricular dysfunction   . Lymphedema   . Nephrolithiasis   . Obstructive sleep apnea   . Osteoarthritis    knees/cervical and lumbar spine  . Pulmonary hypertension ([place])   . Pulmonary nodules    followed by Dr Raul Del  . Pure hypercholesterolemia   . Renal cyst    right   Past Surgical History:  Procedure Laterality Date  . ABDOMINAL HYSTERECTOMY     ovaries left in place  . APPENDECTOMY    . Back Surgeries    . BREAST REDUCTION SURGERY     3/99  . CARDIAC CATHETERIZATION    . cataracts Bilateral   . CERVICAL SPINE SURGERY    . ESOPHAGEAL MANOMETRY N/A 08/02/2015   Procedure: ESOPHAGEAL MANOMETRY (EM);  Surgeon: Josefine Class, MD;  Location: [facility] ENDOSCOPY;  Service: Endoscopy;  Laterality: N/A;  . ESOPHAGOGASTRODUODENOSCOPY N/A 02/27/2015   Procedure: ESOPHAGOGASTRODUODENOSCOPY (EGD);  Surgeon: Hulen Luster, MD;  Location: [facility] ENDOSCOPY;  Service: Gastroenterology;  Laterality: N/A;  . EXCISIONAL HEMORRHOIDECTOMY    . HEMORRHOID SURGERY    . HIP SURGERY  2013   Right hip surgery  . KNEE ARTHROSCOPY     left and right  . ORIF FEMUR FRACTURE Right 08/07/2018   Procedure: OPEN REDUCTION INTERNAL FIXATION (ORIF) DISTAL FEMUR FRACTURE;  Surgeon: Hessie Knows, MD;  Location: [facility] ORS;  Service: Orthopedics;  Laterality: Right;  . REDUCTION MAMMAPLASTY Bilateral YRS AGO  . REPLACEMENT TOTAL KNEE Bilateral   . rotator cuff surgery     blilateral  . TONSILECTOMY/ADENOIDECTOMY WITH MYRINGOTOMY     Family History  Problem Relation Age of Onset  . Heart disease Mother   . Stroke Mother   . Hypertension Mother   . Heart disease Father  myocardial infarction age 50  . Breast cancer Neg Hx    Social History   Socioeconomic History  . Marital status: Married    Spouse name: Not on file  . Number of children: 2  . Years of education: Not on file  . Highest education level: Not on file  Occupational History  . Not on file  Social Needs  . Financial resource strain: Not very hard  . Food insecurity:    Worry: Never true    Inability: Never true  . Transportation needs:    Medical: No    Non-medical: No  Tobacco Use  . Smoking status: Never Smoker  . Smokeless tobacco: Never Used  Substance and Sexual Activity  . Alcohol use: No    Alcohol/week: 0.0 standard drinks  . Drug use: No  . Sexual activity: Not Currently  Lifestyle  . Physical activity:    Days per week: Not on file    Minutes per session: Not  on file  . Stress: Not on file  Relationships  . Social connections:    Talks on phone: Patient refused    Gets together: Patient refused    Attends religious service: Patient refused    Active member of club or organization: Patient refused    Attends meetings of clubs or organizations: Patient refused    Relationship status: Patient refused  Other Topics Concern  . Not on file  Social History Narrative  . Not on file    Outpatient Encounter Medications as of 09/10/2018  Medication Sig  . aluminum-magnesium hydroxide-simethicone (MAALOX) [phone] MG/5ML SUSP Take 15 mLs by mouth 4 (four) times daily -  before meals and at bedtime.   Marland Kitchen amLODipine (NORVASC) 10 MG tablet TAKE 1 TABLET BY MOUTH DAILY  . atorvastatin (LIPITOR) 20 MG tablet TAKE ONE TABLET BY MOUTH AT BEDTIME  . benazepril (LOTENSIN) 20 MG tablet Take 1 tablet (20 mg total) by mouth daily.  . busPIRone (BUSPAR) 10 MG tablet Take 10 mg by mouth 2 (two) times daily.   . calcium citrate-vitamin D (CITRACAL+D) 315-200 MG-UNIT tablet Take 1 tablet by mouth daily.  [name] Sodium 30-100 MG CAPS Take 1 capsule by mouth daily.   . Cholecalciferol (VITAMIN D3) 1000 UNITS CAPS Take by mouth.  . diclofenac sodium (VOLTAREN) 1 % GEL Apply topically.  . DULoxetine (CYMBALTA) 60 MG capsule TAKE 1 CAPSULE BY MOUTH EVERY DAY.  Marland Kitchen FLUoxetine (PROZAC) 20 MG tablet   . fluticasone (FLONASE) 50 MCG/ACT nasal spray   . gabapentin (NEURONTIN) 300 MG capsule Take 600 mg by mouth 2 (two) times daily.   Marland Kitchen HYDROcodone-acetaminophen (NORCO) 10-325 MG tablet Take 1 tablet by mouth 3 (three) times daily.  . Inulin (FIBER CHOICE PO) Take 2 tablets by mouth daily. Chew tablets  . levothyroxine (SYNTHROID, LEVOTHROID) 100 MCG tablet Take 1 tablet (100 mcg total) by mouth daily.  . magnesium oxide (MAG-OX) 400 MG tablet Take 1 tablet (400 mg total) by mouth daily.  . metoprolol succinate (TOPROL-XL) 50 MG 24 hr tablet Take 1 tablet (50  mg total) by mouth daily. Take with or immediately following a meal.  . morphine (MS CONTIN) 60 MG 12 hr tablet Take 1 tablet (60 mg total) by mouth 2 (two) times daily.  Melynda Ripple Tosylate (SYMPROIC) 0.2 MG TABS Take 0.2 mg by mouth daily.  . pantoprazole (PROTONIX) 40 MG tablet TAKE 1 TABLET BY MOUTH TWICE A DAY.  . Probiotic Product (ALIGN PO) Take by mouth daily.  Marland Kitchen  senna (SENOKOT) 8.6 MG TABS tablet Take 1 tablet by mouth 2 (two) times daily.  . Simethicone (GAS-X PO) Take 2 tablets by mouth 2 (two) times daily.  . sodium chloride (OCEAN) 0.65 % nasal spray Place 1 spray into the nose as needed.  . triamcinolone cream (KENALOG) 0.1 %   . [DISCONTINUED] cephALEXin (KEFLEX) 250 MG capsule Take 250 mg by mouth daily.   . [DISCONTINUED] enoxaparin (LOVENOX) 40 MG/0.4ML injection Inject 0.4 mLs (40 mg total) into the skin daily.  . [DISCONTINUED] ferrous [id number] C-folic acid (TRINSICON / FOLTRIN) capsule Take 1 capsule by mouth 2 (two) times daily after a meal.   No facility-administered encounter medications on file as of 09/10/2018.     Review of Systems  Constitutional: Positive for fatigue. Negative for appetite change.  HENT: Negative for congestion and sinus pressure.   Respiratory: Negative for cough and chest tightness.        Breathing stable.    Cardiovascular: Negative for chest pain and palpitations.       No significant increased swelling.    Gastrointestinal: Positive for constipation. Negative for diarrhea and vomiting.       Increased gas and bloating.    Genitourinary: Negative for difficulty urinating and dysuria.  Musculoskeletal: Negative for joint swelling and myalgias.  Skin: Negative for color change and rash.  Neurological: Negative for dizziness, light-headedness and headaches.  Psychiatric/Behavioral: Negative for agitation and dysphoric mood.       Objective:    Physical Exam Constitutional:      General: She is not in acute distress.     Appearance: Normal appearance.  HENT:     Nose: Nose normal. No congestion.     Mouth/Throat:     Pharynx: No oropharyngeal exudate or posterior oropharyngeal erythema.  Neck:     Musculoskeletal: Neck supple. No muscular tenderness.     Thyroid: No thyromegaly.  Cardiovascular:     Rate and Rhythm: Normal rate and regular rhythm.  Pulmonary:     Effort: No respiratory distress.     Breath sounds: Normal breath sounds. No wheezing.  Abdominal:     General: Bowel sounds are normal.     Palpations: Abdomen is soft.     Tenderness: There is no abdominal tenderness.  Musculoskeletal:        General: No tenderness.     Comments: No increased lower extremity swelling.    Lymphadenopathy:     Cervical: No cervical adenopathy.  Skin:    Findings: No erythema or rash.  Neurological:     Mental Status: She is alert.  Psychiatric:        Mood and Affect: Mood normal.        Behavior: Behavior normal.     BP 122/70 (BP Location: Left Arm, Patient Position: Sitting, Cuff Size: Large)   Pulse 60   Temp 98.1 F (36.7 C) (Oral)   Resp 16   SpO2 96%  Wt Readings from Last 3 Encounters:  08/10/18 218 lb 0.6 oz (98.9 kg)  07/15/18 184 lb (83.5 kg)  07/03/18 183 lb 12.8 oz (83.4 kg)     Lab Results  Component Value Date   WBC 8.4 08/08/2018   HGB 8.2 (L) 08/10/2018   HCT 26.0 (L) 08/09/2018   PLT 130 (L) 08/08/2018   GLUCOSE 83 09/10/2018   CHOL 117 09/10/2018   TRIG 91.0 09/10/2018   HDL 41.40 09/10/2018   LDLCALC 57 09/10/2018   ALT 9 09/10/2018   AST  12 09/10/2018   NA 139 09/10/2018   K 4.9 09/10/2018   CL 102 09/10/2018   CREATININE 0.84 09/10/2018   BUN 18 09/10/2018   CO2 31 09/10/2018   TSH 2.87 09/10/2018   INR 0.89 08/06/2018   HGBA1C 6.0 10/19/2014    Dg Chest 1 View  Result Date: 08/06/2018 CLINICAL DATA:  Fall. EXAM: CHEST  1 VIEW COMPARISON:  07/18/2017 FINDINGS: Cardiomegaly. There may be a small loculated left lateral pleural effusion. No confluent  airspace opacities. No acute bony abnormality. IMPRESSION: Cardiomegaly. Possible small loculated left lateral pleural effusion. Electronically Signed   By: Rolm Baptise M.D.   On: 08/06/2018 22:39   Dg Knee 2 Views Right  Result Date: 08/06/2018 CLINICAL DATA:  85 year old female with fall and right lower extremity pain. EXAM: RIGHT KNEE - 1-2 VIEW COMPARISON:  Right lower extremity radiograph dated 10/27/2010 FINDINGS: There is a total right knee arthroplasty. The arthroplasty components appear intact and in anatomic alignment. No evidence of arthroplasty loosening. There is a displaced spiral fracture of the distal femoral diaphysis with approximately 3 cm medial displacement of the distal fracture fragment. The bones are osteopenic. There is no dislocation. The soft tissues are unremarkable. IMPRESSION: 1. Displaced spiral fracture of the distal femoral diaphysis. 2. Hardware intact. Electronically Signed   By: Anner Crete M.D.   On: 08/06/2018 22:38   Dg Knee Right Port  Result Date: 08/07/2018 CLINICAL DATA:  Status post ORIF EXAM: PORTABLE RIGHT KNEE - 1-2 VIEW COMPARISON:  08/06/2018 FINDINGS: Lateral compression plate and screw fixation of a distal femoral shaft fracture. Approximately 1/2 shaft width medial displacement of the distal fracture fragment persists. Associated distal cerclage wire. Lateral skin staples and mild soft tissue gas. Prior proximal femoral shaft compression plate fixation hardware, incompletely visualized. Prior right knee arthroplasty, without evidence of complication. IMPRESSION: Status post ORIF of a distal femoral shaft fracture, with mild residual displacement, as described above. Electronically Signed   By: Julian Hy M.D.   On: 08/07/2018 21:59   Dg C-arm 1-60 Min-no Report  Result Date: 08/07/2018 Fluoroscopy was utilized by the requesting physician.  No radiographic interpretation.   Dg Hip Unilat W Or Wo Pelvis 2-3 Views Right  Result Date:  08/06/2018 CLINICAL DATA:  Fall.  Right hip pain. EXAM: DG HIP (WITH OR WITHOUT PELVIS) 2-3V RIGHT COMPARISON:  CT 04/30/2018 FINDINGS: Changes of remote proximal right femoral fracture and repair. No acute fracture, subluxation or dislocation. Moderate degenerative changes in the hips bilaterally. Diffuse osteopenia. IMPRESSION: Remote right proximal femoral fracture and internal fixation. No acute bony abnormality. Electronically Signed   By: Rolm Baptise M.D.   On: 08/06/2018 22:38       Assessment & Plan:   Problem List Items Addressed This Visit    Abdominal pain    With bloating and increased gas as outlined.  Has seen GI.  Was planning for colonoscopy.  Had to be postponed secondary to recent fracture.  Continue GasX.  Take miralax and stool softeners on a daily basis.        Anemia    Followed by hematology.        Back pain    Chronic pain.  Has been followed by ortho and pain clinic.        Chronic constipation    Has been evaluated by GI.  Colonoscopy had to be postponed.  miralax and stool softeners daily.  Follow.       Chronic venous insufficiency  Continue compression hose.        Femur fracture (HCC)    S/p ORIF. Working with therapy.  Continue f/u with orho.  Plan for f/u bone density.        GERD (gastroesophageal reflux disease)    Controlled on current regimen.        Hyperlipidemia    On lipitor.  Low cholesterol diet and exercise.  Follow lipid panel and liver function tests.        Hypertension    Blood pressure under good control.  Continue same medication regimen.  Follow pressures.  Follow metabolic panel.        Hypothyroidism    On thyroid replacement.  Follow tsh.       Leg swelling    Continue compression hose.        OSA (obstructive sleep apnea)    CPAP.       Osteoporosis    Will need f/u.  Recent fracture.  Has declined reclast previously.        Pulmonary hypertension ([place])    Followed by cardiology.  Has also seen  pulmonary.  Treat sleep apnea.           Einar Pheasant, MD

## 2018-09-11 ENCOUNTER — Ambulatory Visit: Payer: PPO

## 2018-09-12 DIAGNOSIS — M5416 Radiculopathy, lumbar region: Secondary | ICD-10-CM | POA: Diagnosis not present

## 2018-09-12 DIAGNOSIS — Q675 Congenital deformity of spine: Secondary | ICD-10-CM | POA: Diagnosis not present

## 2018-09-12 DIAGNOSIS — K5903 Drug induced constipation: Secondary | ICD-10-CM | POA: Diagnosis not present

## 2018-09-12 DIAGNOSIS — Z79899 Other long term (current) drug therapy: Secondary | ICD-10-CM | POA: Diagnosis not present

## 2018-09-12 DIAGNOSIS — S76312A Strain of muscle, fascia and tendon of the posterior muscle group at thigh level, left thigh, initial encounter: Secondary | ICD-10-CM | POA: Diagnosis not present

## 2018-09-12 DIAGNOSIS — M519 Unspecified thoracic, thoracolumbar and lumbosacral intervertebral disc disorder: Secondary | ICD-10-CM | POA: Diagnosis not present

## 2018-09-12 DIAGNOSIS — M48061 Spinal stenosis, lumbar region without neurogenic claudication: Secondary | ICD-10-CM | POA: Diagnosis not present

## 2018-09-13 ENCOUNTER — Encounter: Payer: Self-pay | Admitting: Internal Medicine

## 2018-09-13 NOTE — Assessment & Plan Note (Signed)
Followed by hematology.

## 2018-09-13 NOTE — Assessment & Plan Note (Signed)
Blood pressure under good control.  Continue same medication regimen.  Follow pressures.  Follow metabolic panel.

## 2018-09-13 NOTE — Assessment & Plan Note (Signed)
With bloating and increased gas as outlined.  Has seen GI.  Was planning for colonoscopy.  Had to be postponed secondary to recent fracture.  Continue GasX.  Take miralax and stool softeners on a daily basis.

## 2018-09-13 NOTE — Assessment & Plan Note (Signed)
Followed by cardiology.  Has also seen pulmonary.  Treat sleep apnea.

## 2018-09-13 NOTE — Assessment & Plan Note (Signed)
S/p ORIF. Working with therapy.  Continue f/u with orho.  Plan for f/u bone density.

## 2018-09-13 NOTE — Assessment & Plan Note (Signed)
Continue compression hose.   

## 2018-09-13 NOTE — Assessment & Plan Note (Signed)
Will need f/u.  Recent fracture.  Has declined reclast previously.

## 2018-09-13 NOTE — Assessment & Plan Note (Signed)
On thyroid replacement.  Follow tsh.  

## 2018-09-13 NOTE — Assessment & Plan Note (Signed)
Chronic pain.  Has been followed by ortho and pain clinic.

## 2018-09-13 NOTE — Assessment & Plan Note (Signed)
Has been evaluated by GI.  Colonoscopy had to be postponed.  miralax and stool softeners daily.  Follow.

## 2018-09-13 NOTE — Assessment & Plan Note (Signed)
On lipitor.  Low cholesterol diet and exercise.  Follow lipid panel and liver function tests.   

## 2018-09-13 NOTE — Assessment & Plan Note (Signed)
CPAP.  

## 2018-09-13 NOTE — Assessment & Plan Note (Signed)
Controlled on current regimen.   

## 2018-09-14 ENCOUNTER — Telehealth: Payer: Self-pay | Admitting: Internal Medicine

## 2018-09-14 NOTE — Telephone Encounter (Signed)
Results given and documented in result note.

## 2018-09-14 NOTE — Telephone Encounter (Signed)
Copied from [place] [id number]. Topic: Quick Communication - Lab Results (Clinic Use ONLY) >> Sep 11, 2018  5:05 PM Lars Masson, LPN wrote: Called patient to inform them of lab results. When patient returns call, triage nurse may disclose results. >> Sep 14, 2018  1:55 PM Alfredia Ferguson R wrote: Patient calling in for lab results

## 2018-09-16 ENCOUNTER — Encounter: Payer: Self-pay | Admitting: *Deleted

## 2018-09-16 ENCOUNTER — Other Ambulatory Visit: Payer: Self-pay | Admitting: *Deleted

## 2018-09-16 NOTE — Patient Outreach (Signed)
[place] [facility]) Care Management [facility] Community CM Telephone Outreach, Transition of Care day 20 Post-SNF discharge day # 24 Unsuccessful second consecutive outreach attempt 09/16/2018  Kimberly Roy 1931/01/04 [id number]  Unsuccessful telephone outreach to [name], 85 y/o female referred to [place] by [facility] PAC on 08/25/18; PAC received original referral/ notification of patient discharge from SNF to home after recent SNF rehabilitation visit post-hospital visit/ discharge December 6-9, 2019 for mechanical fall resulting in (R) femur/ hip fracture requiring ORIF with post-operative anemia.Patient has history including, but not limited to, HTN/ HLD; CHF; OSA/ pulmonary HTN, on O2 2 L/min via [place] q HS; GERD.    HIPAA compliant voice mail message left for patient, requesting return call back.  Plan:  Will place [facility] Community CM unsuccessful patient outreach letter in mail requesting call back in writing  Will re-attempt [place] telephone outreach within 4 business days if I do not hear back from patient first.  Oneta Rack, RN, BSN, [employer] Coordinator [facility] Care Management  [phone]

## 2018-09-21 ENCOUNTER — Encounter: Payer: Self-pay | Admitting: *Deleted

## 2018-09-21 ENCOUNTER — Other Ambulatory Visit: Payer: Self-pay | Admitting: *Deleted

## 2018-09-21 NOTE — Patient Outreach (Signed)
[place] [facility]) Care Management [facility] Community CM Telephone Outreach, Transition of Care day 25 Post- SNF discharge day # 29 09/21/2018  JORDIE SKALSKY 02/05/1931 [id number]  Successful telephone outreach to [name], 85 y/o female referred to [place] by [facility] PAC on 08/25/18; PAC received original referral/ notification of patient discharge from SNF to home after recent SNF rehabilitation visit post-hospital visit/ discharge December 6-9, 2019 for mechanical fall resulting in (R) femur/ hip fracture requiring ORIF with post-operative anemia.Patient has history including, but not limited to, HTN/ HLD; CHF; OSA/ pulmonary HTN, on O2 2 L/min via [place] q HS; GERD.  HIPAA/ identity verified.  Today, patient reportsthat she is "doing okay, actually much better lately."  Reports today that her husband has returned home from his recently reported hospitalization; states "things are going well for both of [place]."  Reports that her daughter has continued staying at her home and will be at her home "until the end of the month."  Reports that at that time, they will continue using private duty caregivers; also reports today that she and her husband has long term care insurance benefits, "which should kick in by the time" her daughter is no longer there to assist in care.  Patient denies pain today and also denies new/ recent falls- states that she is continuing to use wheelchair primarily but also has a cane and walker.  Patient sounds to be in no distress throughout phone call today.  Patient further reports:  Medications: -- Has all medicationsand takes as prescribed;denies questions/ concerns, recent changes around current medications. -- Patient again declines medication review, stating that her pharmacist/ daughter [name] continues supervising medications as needed; states she "just doesn't feel like" reviewing her medications with me, despite my encouragement to do so.    -- reports that  although her daughter has been assisting in her medication management post- SNF discharge, that she is "very knowledgeable about medications," and that she has no concerns/ questions around her medications.  States pharmacist daughter has been "helping less and less" with medication management post- SNF discharge, and that she has resumed all aspects of medication management in preparation for daughter's departure at end of month.  Home health [facility]) services: -- [place] [facility], [facility], [facility] states that [facility] team has continued visiting her regularly; states that until she is able to begin bearing weight, that [facility] PT has signed off; states that she is to contact [facility] PT once she receives approval to resume weight bearing.  Patient confirms that she has the contact information for [facility] PT -- patient reports again today that she has a private duty CNA that assists her in care for ADL's regularlyfrom 8-4 pm "every day."  Provider appointments: -- All recent and upcoming provider appointments were reviewed with patient today; patient reports plans to attend all, and confirms that either her daughter or her private duty CNA will transport to appointments -- acknowledges that she attended PCP office visit 09/10/2018- denies questions post-office visit; notes were reviewed with patient today -- reports upcoming orthopedic surgeon appointment next week on Monday 09/28/2018  Self-health management of chronic disease state of CHF/ pulmonary HTN: -- denies concerns around breathing status: states continues using O2 at home and CPAP; denies questions/ concerns around breathing status -- unable to monitor/ record daily weights at home due to non-weight bearing status -- briefly discussed signs/ symptoms concerning for yellow CHF zone; patient reports that she is not  having problems and "already understands" reasons to contact care providers/ seek emergency care around changes in  breathing status  Patient denies further issues, concerns, or problems today.Again confirmed that patient hasmy direct phone number, the main [facility] CM office phone number, and the [facility] CM 24-hour nurse advice phone number should issues arise prior to next scheduled [place] outreachby phone next week, and encouraged patient to contact me directly if needs, questions, issues, or concerns arise prior to next scheduled outreach; patient agreed to do so.  Plan:  Patient will take medications as prescribed and will attend all scheduled provider appointments  Patient will promptly notify care providers for any new concerns/ issues/ problems that arise  Patient will actively participate in home health services as ordered post-hospital discharge  I will send today's [place] notes/ care plan to patient's PCP as initial assessment and will make PCP aware that patient has continued to decline [facility] Community CM medication review post- SNF discharge  [place] outreachfor ongoing transition of careto continue with scheduled phone call next week  [facility] CM Care Plan Problem One     Most Recent Value  Care Plan Problem One  High Risk for hospital readmission as evidenced by recent hospital visit December 6-9, 2019 for mechanical fall with subsequent hip/ femur fracture, with discharge to SNF for rehabilitation  Role Documenting the Problem One  Care Management [place] for Problem One  Active  THN Long Term Goal   Over the next 31 days, patient will not experience hospital readmission as evidenced by patient reporting and review of EMR during [facility] RN CCM outreach  [place] Term Goal Start Date  08/28/18  Interventions for Problem One Long Term Goal  Discussed current clinical condition with patient,  confirmed that she has no current concerns aorund her clinical condition,  confirmed that she attended recent PCP office visit and reviewed post-appointment discharge  instructions with patient,  reviewed upcoming provider appointments and ocnfirmed that patient has reliable transportation to upcoming provider appointments  THN CM Short Term Goal #1   Over the next 30 days, patient will actively participate in home health services as ordered post- SNF discharge, as evidenced by patient reporting and collaboration with home health team as indicated during [place] outreach  [facility] CM Short Term Goal #1 Start Date  08/28/18  Interventions for Short Term Goal #1  Discussed ongoing home health services with patient and confirmed that she continues actively participating oin all disciplines and has contact information for home health team  [facility] CM Short Term Goal #2   Over the next 30 days, patient will attend all scheduled provider office visits, as evidenced by patient reporitng and review of EMR/ collaboration with providers as indicated during [facility] RN CCM outreach  [facility] CM Short Term Goal #2 Start Date  08/28/18  Interventions for Short Term Goal #2  Reviewed recent and upcoming provider appointments with patient and confirmed that she has ongoing reliable transportation in place for all scheduled provider appointments    [facility] CM Care Plan Problem Two     Most Recent Value  Care Plan Problem Two  High fall risk related to/ as evidenced by recent fall with injury and subsequent mobility restrictions requiring patient to be in wheelchair  Role Documenting the Problem Two  Care Management Coordinator  Care Plan for Problem Two  Active  Interventions for Problem Two Long Term Goal   Discussed patient's progress with  home health PT and her current use of assistive devices,  confirmed that patient has had no recent falls and using teach back method, re-iterated fall risks/ prevention education, as previously provided  [facility] Long Term Goal  Over the next 45 days, patient will use assistive devices as indicated and instructed post- recent ORIF due to hip/ femur fracture, as  evidenced by patient reporting during [place] outreach  [place] Term Goal Start Date  08/28/18     Oneta Rack, RN, BSN, [place] Coordinator [facility] Care Management  [phone]

## 2018-09-28 DIAGNOSIS — M80051D Age-related osteoporosis with current pathological fracture, right femur, subsequent encounter for fracture with routine healing: Secondary | ICD-10-CM | POA: Diagnosis not present

## 2018-09-29 ENCOUNTER — Encounter: Payer: Self-pay | Admitting: *Deleted

## 2018-09-29 ENCOUNTER — Ambulatory Visit: Admit: 2018-09-29 | Payer: PPO | Admitting: Gastroenterology

## 2018-09-29 ENCOUNTER — Other Ambulatory Visit: Payer: Self-pay | Admitting: *Deleted

## 2018-09-29 SURGERY — ESOPHAGOGASTRODUODENOSCOPY (EGD) WITH PROPOFOL
Anesthesia: General

## 2018-09-29 NOTE — Patient Outreach (Addendum)
[place] [facility]) Care Management [facility] Community CM Telephone Outreach, Transition of Care day 33 Post- SNF discharge day # 37 09/29/2018  Kimberly Roy 11/16/1930 [id number]  Successful telephone outreach Kimberly Roy, 85 y/o female referred to [place] by [facility] PAC on 08/25/18; PAC received original referral/ notification of patient discharge from SNF to home after recent SNF rehabilitation visit post-Roy visit/ discharge December 6-9, 2019 for mechanical fall resulting in (R) femur/ hip fracture requiring ORIF with post-operative anemia.Patient has history including, but not limited to, HTN/ HLD; CHF; OSA/ pulmonary HTN, on O2 2 L/min via [place] q HS; GERD. HIPAA/ identity verified.  Today, patient reportsthat "everything is going really great," and she denies pain, new recent falls, and she sounds to be in no distress throughout phone call today.  .    Patient states that she visited with her orthopedic provider yesterday and "got a great report." States that orthopedic provider has released her to resume home health PT and that she was approved to begin weight bearing exercises; states she has already contacted home health PT who was previously active in her care post- SNF discharge to resume home health visits.  Discussed with patient that I am able to facilitate this if needed, and she stated that the home health PT was waiting for her to contact for resumption of services.  Patient declines needing assistance with this today- I encouraged her to contact me at any point should she require assistance, and she agrees to do so if needed.  Patient confirms that her daughter currently remains at her home to assist as needed for both patient and her her spouse, who has had a recent CVA.  Reports "everything under control," and confirms that she continues having in-home private duty care assistance every day.  Continues to decline medication review and denies concerns around all  aspects of medications.  Reviewed with patient upcoming scheduled provider appointments and she confirms that she has reliable transportation for all scheduled provider office visits; states that she "may cancel February PCP appointment;" states that this appointment was "only made in case things were not going well," post- last PCP office visit in January- states that PCP told her that if all was going well, she could cancel appointment.  Self-health management of chronic disease state of CHF/ pulmonary HTN: -- continues to deny concerns around breathing status: continues using O2 at home and CPAP; denies concerns around breathing status/ lower extremity swelling -- still unable to monitor/ record daily weights at home due to non-weight bearing status; reiterated with patient need to resume daily weight monitoring as soon as she is able to safely bear weight, and she verbalizes understanding and agreement and states she will do. -- reiterated with patient previously provided education around signs/ symptoms concerning for yellow CHF zone; patient reports that she is not having problems and continues to verbalize good understanding of reasons to contact care providers/ seek emergency care around changes in breathing status  Patientdenies further issues, concerns, or problems today.Discussed with patient that she has thus far made great progress in meeting her previously established [place] CM goals, and we discussed possibility of case closure with next telephone outreach next month; patient verbalizes agreement with this plan.  Again confirmed that patient hasmy direct phone number, the main [facility] CM office phone number, and the [facility] CM 24-hour nurse advice phone number should issues arise prior to next scheduled [place] outreachby phone next month, and encouraged patient  to contact me directly if needs, questions, issues, or concerns arise prior to next scheduled outreach; patient agreed  to do so.  Plan:  Patient will take medications as prescribed and will attend all scheduled provider appointments  Patient will promptly notify care providers for any new concerns/ issues/ problems that arise  Patient will actively participate in home health services as ordered post-Roy discharge  [place] outreachto continue with scheduled phone call next month, possibly for case closure  [facility] CM Care Plan Problem One     Most Recent Value  Care Plan Problem One  High Risk for [place] readmission as evidenced by recent [place] visit December 6-9, 2019 for mechanical fall with subsequent hip/ femur fracture, with discharge to SNF for rehabilitation  Role Documenting the Problem One  Care Management [place] for Problem One  Not Active  THN Long Term Goal   Over the next 31 days, patient will not experience [place] readmission as evidenced by patient reporting and review of EMR during [facility] RN CCM outreach  [place] Term Goal Start Date  08/28/18  [facility] Long Term Goal Met Date  09/29/18 [facility] Met]  Interventions for Problem One Long Term Goal  Confirmed that patient did not experience [place] readmission post- last discharge from [place]/ SNF  [facility] CM Short Term Goal #1   Over the next 30 days, patient will actively participate in home health services as ordered post- SNF discharge, as evidenced by patient reporting and collaboration with home health team as indicated during [place] outreach  [facility] CM Short Term Goal #1 Start Date  08/28/18  [facility] CM Short Term Goal #1 Met Date  09/29/18 [Goal Met]  Interventions for Short Term Goal #1  Confirmed with patient that she has actively participated in home health services,  confirmed that she has re-contacted home health PT to resume home visit post- orthopedic provider office visit yesterday  THN CM Short Term Goal #2   Over the next 30 days, patient will attend all scheduled provider office visits, as evidenced by  patient reporitng and review of EMR/ collaboration with providers as indicated during [place] CCM outreach  [facility] CM Short Term Goal #2 Start Date  08/28/18  [facility] CM Short Term Goal #2 Met Date  09/29/18 [Goal Met]  Interventions for Short Term Goal #2  Reviewed yesterday's office visit with orthopedic provider with patient,  reviewed upcoming scheduled provider visits with patient and confirmed that she has ongoing reliable transportation to all appointments    [facility] CM Care Plan Problem Two     Most Recent Value  Care Plan Problem Two  High fall risk related to/ as evidenced by recent fall with injury and subsequent mobility restrictions requiring patient to be in wheelchair  Role Documenting the Problem Two  Care Management Coordinator  Care Plan for Problem Two  Active  Interventions for Problem Two Long Term Goal   Discussed new insructions for activity restrictions and resumption of home health PT with patient post- orthopedic provider office visit yesterday  THN Long Term Goal  Over the next 22 days, patient will use assistive devices as indicated and instructed post- recent ORIF due to hip/ femur fracture, as evidenced by patient reporting during [facility] RN CCM outreach [Goal extended today]  THN Long Term Goal Start Date  09/29/18  [facility] CM Short Term Goal #1   Over the next 22 days, patient will actively participate with home health PT services to  increase activity and weight bearing, as evidenced by patient reporting and collaboration with home health team as indicated during [place] outreach  [facility] CM Short Term Goal #1 Start Date  09/29/18  Interventions for Short Term Goal #2   Confirmed that patient has contacted home health PT to resume home visits post- orthopedic provider office visit yesterday,  encouraged patient's ongoing active participation in home health PT     Oneta Rack, RN, BSN, [employer] Coordinator [facility] Care Management  [phone]

## 2018-09-30 DIAGNOSIS — S72341D Displaced spiral fracture of shaft of right femur, subsequent encounter for closed fracture with routine healing: Secondary | ICD-10-CM | POA: Diagnosis not present

## 2018-10-01 DIAGNOSIS — N3010 Interstitial cystitis (chronic) without hematuria: Secondary | ICD-10-CM | POA: Diagnosis not present

## 2018-10-07 ENCOUNTER — Telehealth: Payer: Self-pay | Admitting: Internal Medicine

## 2018-10-07 NOTE — Telephone Encounter (Signed)
Patient says she is taking 100 mg in the morning and 50 mg at night. She has not been checking her blood pressure but feels fine. Advised that I would have to talk to you before sending in refills. Advised that you are out of the office and will return tomorrow.

## 2018-10-07 NOTE — Telephone Encounter (Signed)
I am confused.  Per the discharge summary, she was on metoprolol XL 91m q day.  When did she increase to 1024min the am and 5075mn the pm.  Need to clarify with pt and with pharmacy how she has bee taking.

## 2018-10-07 NOTE — Telephone Encounter (Signed)
Copied from [place] [phone]. Topic: Quick Communication - Rx Refill/Question >> Oct 07, 2018 10:44 AM Kimberly Roy wrote: Medication: metoprolol succinate (TOPROL-XL) 50 MG 24 hr tablet - was prescribed when pt was in the hospital - she is asking for refills from Dr. Nicki Reaper - she has 3 days left - she has some of the 130m but is concerned about cutting them in half. Please advise.   Has the patient contacted their pharmacy? Yes - no refills - need new RX Preferred Pharmacy (with phone number or street name): [name]

## 2018-10-07 NOTE — Telephone Encounter (Signed)
Sent to me by accident

## 2018-10-07 NOTE — Telephone Encounter (Signed)
Per note, is taking toprol XL 36m one po q day.  If correct, ok to send in rx for refills.

## 2018-10-08 ENCOUNTER — Other Ambulatory Visit: Payer: Self-pay

## 2018-10-08 MED ORDER — METOPROLOL SUCCINATE ER 50 MG PO TB24: ORAL_TABLET | ORAL | 1 refills | Status: DC

## 2018-10-08 NOTE — Telephone Encounter (Signed)
Spoke with pharmacy. Patient picked up Metoprolol XL 50 mg on 08-10-18 #30 no refills from the hospital. On 09-01-18, patient picked up Metoprolol XL 100 mg #90 that was written from our office in October. Patient has been taking the 100 mg in the morning and the 50 mg at night. Today, her BP was 130/70. Patient does not want to stop taking the 100 mg because she has been doing okay on this dose and she does not want her BP to trend up.

## 2018-10-08 NOTE — Telephone Encounter (Signed)
New rx sent in with corrected directions. Patient stated she still had a lot of 100 mg tablets left but she verbalized understanding that when she starts new bottle she should take 2 tabs in the morning and 1 tablet at night

## 2018-10-08 NOTE — Telephone Encounter (Signed)
Per review of records, cardiology's last note (prior to her last discharge note), she was on toprol 159m in the am and 50 mg in the evening.  If she has been taking this and tolerating and blood pressure and pulse doing well, continue on this dose.  Will need to clarify with pharmacy.

## 2018-10-12 DIAGNOSIS — M25511 Pain in right shoulder: Secondary | ICD-10-CM | POA: Diagnosis not present

## 2018-10-12 DIAGNOSIS — M19011 Primary osteoarthritis, right shoulder: Secondary | ICD-10-CM | POA: Diagnosis not present

## 2018-10-15 ENCOUNTER — Encounter: Payer: Self-pay | Admitting: Internal Medicine

## 2018-10-15 ENCOUNTER — Ambulatory Visit (INDEPENDENT_AMBULATORY_CARE_PROVIDER_SITE_OTHER): Payer: PPO | Admitting: Internal Medicine

## 2018-10-15 VITALS — BP 120/78 | HR 57 | Temp 97.9°F

## 2018-10-15 DIAGNOSIS — R748 Abnormal levels of other serum enzymes: Secondary | ICD-10-CM | POA: Diagnosis not present

## 2018-10-15 DIAGNOSIS — E785 Hyperlipidemia, unspecified: Secondary | ICD-10-CM | POA: Diagnosis not present

## 2018-10-15 DIAGNOSIS — I509 Heart failure, unspecified: Secondary | ICD-10-CM

## 2018-10-15 DIAGNOSIS — I872 Venous insufficiency (chronic) (peripheral): Secondary | ICD-10-CM

## 2018-10-15 DIAGNOSIS — I10 Essential (primary) hypertension: Secondary | ICD-10-CM | POA: Diagnosis not present

## 2018-10-15 DIAGNOSIS — G4733 Obstructive sleep apnea (adult) (pediatric): Secondary | ICD-10-CM | POA: Diagnosis not present

## 2018-10-15 DIAGNOSIS — E039 Hypothyroidism, unspecified: Secondary | ICD-10-CM

## 2018-10-15 DIAGNOSIS — G629 Polyneuropathy, unspecified: Secondary | ICD-10-CM

## 2018-10-15 DIAGNOSIS — R1084 Generalized abdominal pain: Secondary | ICD-10-CM

## 2018-10-15 DIAGNOSIS — F439 Reaction to severe stress, unspecified: Secondary | ICD-10-CM | POA: Diagnosis not present

## 2018-10-15 DIAGNOSIS — D649 Anemia, unspecified: Secondary | ICD-10-CM

## 2018-10-15 DIAGNOSIS — I272 Pulmonary hypertension, unspecified: Secondary | ICD-10-CM

## 2018-10-15 DIAGNOSIS — K219 Gastro-esophageal reflux disease without esophagitis: Secondary | ICD-10-CM

## 2018-10-15 LAB — CBC WITH DIFFERENTIAL/PLATELET
Basophils Absolute: 0 10*3/uL (ref 0.0–0.1)
Basophils Relative: 0.4 % (ref 0.0–3.0)
Eosinophils Absolute: 0.1 10*3/uL (ref 0.0–0.7)
Eosinophils Relative: 0.4 % (ref 0.0–5.0)
HCT: 34.8 % — ABNORMAL LOW (ref 36.0–46.0)
Hemoglobin: 11.4 g/dL — ABNORMAL LOW (ref 12.0–15.0)
LYMPHS ABS: 2.5 10*3/uL (ref 0.7–4.0)
Lymphocytes Relative: 21.9 % (ref 12.0–46.0)
MCHC: 32.8 g/dL (ref 30.0–36.0)
MCV: 95.9 fl (ref 78.0–100.0)
MONO ABS: 1.1 10*3/uL — AB (ref 0.1–1.0)
Monocytes Relative: 9.5 % (ref 3.0–12.0)
Neutro Abs: 7.7 10*3/uL (ref 1.4–7.7)
Neutrophils Relative %: 67.8 % (ref 43.0–77.0)
Platelets: 202 10*3/uL (ref 150.0–400.0)
RBC: 3.63 Mil/uL — ABNORMAL LOW (ref 3.87–5.11)
RDW: 15.4 % (ref 11.5–15.5)
WBC: 11.3 10*3/uL — ABNORMAL HIGH (ref 4.0–10.5)

## 2018-10-15 LAB — HEPATIC FUNCTION PANEL
ALT: 12 U/L (ref 0–35)
AST: 19 U/L (ref 0–37)
Albumin: 4.3 g/dL (ref 3.5–5.2)
Alkaline Phosphatase: 163 U/L — ABNORMAL HIGH (ref 39–117)
Bilirubin, Direct: 0.1 mg/dL (ref 0.0–0.3)
Total Bilirubin: 0.5 mg/dL (ref 0.2–1.2)
Total Protein: 7.2 g/dL (ref 6.0–8.3)

## 2018-10-15 LAB — IBC PANEL
IRON: 93 ug/dL (ref 42–145)
SATURATION RATIOS: 27.3 % (ref 20.0–50.0)
Transferrin: 243 mg/dL (ref 212.0–360.0)

## 2018-10-15 LAB — FERRITIN: Ferritin: 51.4 ng/mL (ref 10.0–291.0)

## 2018-10-15 MED ORDER — AZELASTINE HCL 0.1 % NA SOLN: 1.0000 | Freq: Two times a day (BID) | NASAL | 1 refills | Status: DC

## 2018-10-15 NOTE — Progress Notes (Signed)
Patient ID: Kimberly Roy, female   DOB: 1931/05/05, 85 y.o.   MRN: [id number]   Subjective:    Patient ID: Kimberly Roy, female    DOB: 1930/12/19, 85 y.o.   MRN: [id number]  HPI  Patient here for a scheduled follow up.   She is accompanied by her niece.  History obtained from both of them.  Increased stress with her husband's health issues.  He is staying up at night.  She is not sleeping.  Increased stress and fatigue.  Seeing psychiatry.  Her husband has just been prescribed something different to help him sleep.  Hopefully she will start sleeping better.  Has caretaker at home for him at night.  No chest pain.  Breathing stable.  No acid reflux.  Still with some abdominal discomfort.  Seeing GI.  Planning for endoscopy soon.  Alkaline phosphatase elevated.  Recent surgery.  Following.  Son with increased anxiety.  On prozac. Discussed increasing dose of son's prozac.     Past Medical History:  Diagnosis Date  . Anemia   . Anxiety   . Chest pain   . CHF (congestive heart failure) ([place])   . Constipation   . DDD (degenerative disc disease), cervical   . Depression   . DVT (deep venous thrombosis) ([place])   . Dysphonia   . Dyspnea   . Fatty liver   . Fatty liver   . Headache   . Hyperlipidemia   . Hyperpiesia   . Hypertension   . Hypothyroidism   . Interstitial cystitis   . Left ventricular dysfunction   . Lymphedema   . Nephrolithiasis   . Obstructive sleep apnea   . Osteoarthritis    knees/cervical and lumbar spine  . Pulmonary hypertension ([place])   . Pulmonary nodules    followed by Dr Raul Del  . Pure hypercholesterolemia   . Renal cyst    right   Past Surgical History:  Procedure Laterality Date  . ABDOMINAL HYSTERECTOMY     ovaries left in place  . APPENDECTOMY    . Back Surgeries    . BREAST REDUCTION SURGERY     3/99  . CARDIAC CATHETERIZATION    . cataracts Bilateral   . CERVICAL SPINE SURGERY    . ESOPHAGEAL MANOMETRY N/A 08/02/2015   Procedure: ESOPHAGEAL  MANOMETRY (EM);  Surgeon: Josefine Class, MD;  Location[facility] ENDOSCOPY;  Service: Endoscopy;  Laterality: N/A;  . ESOPHAGOGASTRODUODENOSCOPY N/A 02/27/2015   Procedure: ESOPHAGOGASTRODUODENOSCOPY (EGD);  Surgeon: Hulen Luster, MD;  Location: [facility] ENDOSCOPY;  Service: Gastroenterology;  Laterality: N/A;  . EXCISIONAL HEMORRHOIDECTOMY    . HEMORRHOID SURGERY    . HIP SURGERY  2013   Right hip surgery  . KNEE ARTHROSCOPY     left and right  . ORIF FEMUR FRACTURE Right 08/07/2018   Procedure: OPEN REDUCTION INTERNAL FIXATION (ORIF) DISTAL FEMUR FRACTURE;  Surgeon: Hessie Knows, MD;  Location: [facility] ORS;  Service: Orthopedics;  Laterality: Right;  . REDUCTION MAMMAPLASTY Bilateral YRS AGO  . REPLACEMENT TOTAL KNEE Bilateral   . rotator cuff surgery     blilateral  . TONSILECTOMY/ADENOIDECTOMY WITH MYRINGOTOMY     Family History  Problem Relation Age of Onset  . Heart disease Mother   . Stroke Mother   . Hypertension Mother   . Heart disease Father        myocardial infarction age 34  . Breast cancer Neg Hx    Social History   Socioeconomic History  . Marital  status: Married    Spouse name: Not on file  . Number of children: 2  . Years of education: Not on file  . Highest education level: Not on file  Occupational History  . Not on file  Social Needs  . Financial resource strain: Not very hard  . Food insecurity:    Worry: Never true    Inability: Never true  . Transportation needs:    Medical: No    Non-medical: No  Tobacco Use  . Smoking status: Never Smoker  . Smokeless tobacco: Never Used  Substance and Sexual Activity  . Alcohol use: No    Alcohol/week: 0.0 standard drinks  . Drug use: No  . Sexual activity: Not Currently  Lifestyle  . Physical activity:    Days per week: Not on file    Minutes per session: Not on file  . Stress: Not on file  Relationships  . Social connections:    Talks on phone: Patient refused    Gets together: Patient refused     Attends religious service: Patient refused    Active member of club or organization: Patient refused    Attends meetings of clubs or organizations: Patient refused    Relationship status: Patient refused  Other Topics Concern  . Not on file  Social History Narrative  . Not on file    Outpatient Encounter Medications as of 10/15/2018  Medication Sig  . aluminum-magnesium hydroxide-simethicone (MAALOX) 200-200-20 MG/5ML SUSP Take 15 mLs by mouth 4 (four) times daily -  before meals and at bedtime.   Marland Kitchen amLODipine (NORVASC) 10 MG tablet TAKE 1 TABLET BY MOUTH DAILY  . atorvastatin (LIPITOR) 20 MG tablet TAKE ONE TABLET BY MOUTH AT BEDTIME  . benazepril (LOTENSIN) 20 MG tablet Take 1 tablet (20 mg total) by mouth daily.  . busPIRone (BUSPAR) 10 MG tablet Take 10 mg by mouth 2 (two) times daily.   . calcium citrate-vitamin D (CITRACAL+D) 315-200 MG-UNIT tablet Take 1 tablet by mouth daily.  [name] Sodium 30-100 MG CAPS Take 1 capsule by mouth daily.   . Cholecalciferol (VITAMIN D3) 1000 UNITS CAPS Take by mouth.  . [EXPIRED] diclofenac sodium (VOLTAREN) 1 % GEL Apply topically.  . DULoxetine (CYMBALTA) 60 MG capsule TAKE 1 CAPSULE BY MOUTH EVERY DAY.  Marland Kitchen FLUoxetine (PROZAC) 20 MG tablet   . fluticasone (FLONASE) 50 MCG/ACT nasal spray   . gabapentin (NEURONTIN) 300 MG capsule Take 600 mg by mouth 2 (two) times daily.   Marland Kitchen HYDROcodone-acetaminophen (NORCO) 10-325 MG tablet Take 1 tablet by mouth 3 (three) times daily.  . Inulin (FIBER CHOICE PO) Take 2 tablets by mouth daily. Chew tablets  . levothyroxine (SYNTHROID, LEVOTHROID) 100 MCG tablet Take 1 tablet (100 mcg total) by mouth daily.  . magnesium oxide (MAG-OX) 400 MG tablet Take 1 tablet (400 mg total) by mouth daily.  . metoprolol succinate (TOPROL-XL) 50 MG 24 hr tablet Take 2 tablets by mouth in the morning and 1 tablet by mouth in the evening.  Marland Kitchen morphine (MS CONTIN) 60 MG 12 hr tablet Take 1 tablet (60 mg total) by  mouth 2 (two) times daily.  Melynda Ripple Tosylate (SYMPROIC) 0.2 MG TABS Take 0.2 mg by mouth daily.  . pantoprazole (PROTONIX) 40 MG tablet TAKE 1 TABLET BY MOUTH TWICE A DAY.  . Probiotic Product (ALIGN PO) Take by mouth daily.  Marland Kitchen senna (SENOKOT) 8.6 MG TABS tablet Take 1 tablet by mouth 2 (two) times daily.  . Simethicone (GAS-X  PO) Take 2 tablets by mouth 2 (two) times daily.  . sodium chloride (OCEAN) 0.65 % nasal spray Place 1 spray into the nose as needed.  . triamcinolone cream (KENALOG) 0.1 %   . azelastine (ASTELIN) 0.1 % nasal spray Place 1 spray into both nostrils 2 (two) times daily. Use in each nostril as directed   No facility-administered encounter medications on file as of 10/15/2018.     Review of Systems  Constitutional: Negative for appetite change and unexpected weight change.  HENT: Negative for congestion and sinus pressure.   Respiratory: Negative for cough, chest tightness and shortness of breath.   Cardiovascular: Negative for chest pain and palpitations.       Wearing compression hose.  Swelling better.    Gastrointestinal: Positive for abdominal pain. Negative for diarrhea and vomiting.  Genitourinary: Negative for difficulty urinating and dysuria.  Musculoskeletal:       Chronic msk pain.  No acute issues.    Skin: Negative for color change and rash.  Neurological: Negative for dizziness, light-headedness and headaches.  Psychiatric/Behavioral:       Increased stress as outlined.         Objective:    Physical Exam Constitutional:      General: She is not in acute distress.    Appearance: Normal appearance.  HENT:     Nose: Nose normal. No congestion.     Mouth/Throat:     Pharynx: No oropharyngeal exudate or posterior oropharyngeal erythema.  Neck:     Musculoskeletal: Neck supple. No muscular tenderness.     Thyroid: No thyromegaly.  Cardiovascular:     Rate and Rhythm: Normal rate and regular rhythm.  Pulmonary:     Effort: No respiratory  distress.     Breath sounds: Normal breath sounds. No wheezing.  Abdominal:     General: Bowel sounds are normal.     Palpations: Abdomen is soft.     Tenderness: There is no abdominal tenderness.  Musculoskeletal:        General: No swelling or tenderness.  Lymphadenopathy:     Cervical: No cervical adenopathy.  Skin:    Findings: No erythema or rash.  Neurological:     Mental Status: She is alert.  Psychiatric:        Mood and Affect: Mood normal.        Behavior: Behavior normal.    BP 120/78   Pulse (!) 57   Temp 97.9 F (36.6 C) (Oral)   SpO2 100%  Wt Readings from Last 3 Encounters:  08/10/18 218 lb 0.6 oz (98.9 kg)  07/15/18 184 lb (83.5 kg)  07/03/18 183 lb 12.8 oz (83.4 kg)     Lab Results  Component Value Date   WBC 11.3 (H) 10/15/2018   HGB 11.4 (L) 10/15/2018   HCT 34.8 (L) 10/15/2018   PLT 202.0 10/15/2018   GLUCOSE 83 09/10/2018   CHOL 117 09/10/2018   TRIG 91.0 09/10/2018   HDL 41.40 09/10/2018   LDLCALC 57 09/10/2018   ALT 12 10/15/2018   AST 19 10/15/2018   NA 139 09/10/2018   K 4.9 09/10/2018   CL 102 09/10/2018   CREATININE 0.84 09/10/2018   BUN 18 09/10/2018   CO2 31 09/10/2018   TSH 2.87 09/10/2018   INR 0.89 08/06/2018   HGBA1C 6.0 10/19/2014    Dg Chest 1 View  Result Date: 08/06/2018 CLINICAL DATA:  Fall. EXAM: CHEST  1 VIEW COMPARISON:  07/18/2017 FINDINGS: Cardiomegaly. There may be  a small loculated left lateral pleural effusion. No confluent airspace opacities. No acute bony abnormality. IMPRESSION: Cardiomegaly. Possible small loculated left lateral pleural effusion. Electronically Signed   By: Rolm Baptise M.D.   On: 08/06/2018 22:39   Dg Knee 2 Views Right  Result Date: 08/06/2018 CLINICAL DATA:  85 year old female with fall and right lower extremity pain. EXAM: RIGHT KNEE - 1-2 VIEW COMPARISON:  Right lower extremity radiograph dated 10/27/2010 FINDINGS: There is a total right knee arthroplasty. The arthroplasty components  appear intact and in anatomic alignment. No evidence of arthroplasty loosening. There is a displaced spiral fracture of the distal femoral diaphysis with approximately 3 cm medial displacement of the distal fracture fragment. The bones are osteopenic. There is no dislocation. The soft tissues are unremarkable. IMPRESSION: 1. Displaced spiral fracture of the distal femoral diaphysis. 2. Hardware intact. Electronically Signed   By: Anner Crete M.D.   On: 08/06/2018 22:38   Dg Knee Right Port  Result Date: 08/07/2018 CLINICAL DATA:  Status post ORIF EXAM: PORTABLE RIGHT KNEE - 1-2 VIEW COMPARISON:  08/06/2018 FINDINGS: Lateral compression plate and screw fixation of a distal femoral shaft fracture. Approximately 1/2 shaft width medial displacement of the distal fracture fragment persists. Associated distal cerclage wire. Lateral skin staples and mild soft tissue gas. Prior proximal femoral shaft compression plate fixation hardware, incompletely visualized. Prior right knee arthroplasty, without evidence of complication. IMPRESSION: Status post ORIF of a distal femoral shaft fracture, with mild residual displacement, as described above. Electronically Signed   By: Julian Hy M.D.   On: 08/07/2018 21:59   Dg C-arm 1-60 Min-no Report  Result Date: 08/07/2018 Fluoroscopy was utilized by the requesting physician.  No radiographic interpretation.   Dg Hip Unilat W Or Wo Pelvis 2-3 Views Right  Result Date: 08/06/2018 CLINICAL DATA:  Fall.  Right hip pain. EXAM: DG HIP (WITH OR WITHOUT PELVIS) 2-3V RIGHT COMPARISON:  CT 04/30/2018 FINDINGS: Changes of remote proximal right femoral fracture and repair. No acute fracture, subluxation or dislocation. Moderate degenerative changes in the hips bilaterally. Diffuse osteopenia. IMPRESSION: Remote right proximal femoral fracture and internal fixation. No acute bony abnormality. Electronically Signed   By: Rolm Baptise M.D.   On: 08/06/2018 22:38         Assessment & Plan:   Problem List Items Addressed This Visit    Abdominal pain    Seeing GI.  Planning for EGD soon.  Continue current medication regimen.  Follow.        Anemia    Followed by hematology.       Relevant Orders   CBC with Differential/Platelet (Completed)   Ferritin (Completed)   IBC panel (Completed)   CHF (congestive heart failure) (HCC)    No evidence of volume overload.  Stable.  Sees cardiology.        Chronic venous insufficiency    Continue compression hose.        GERD (gastroesophageal reflux disease)    Controlled on current regimen.        Hyperlipidemia    On lipitor.  Low cholesterol diet and exercise.  Follow lipid panel and liver function tests.        Hypertension    Blood pressure under good control.  Continue same medication regimen.  Follow pressures.  Follow metabolic panel.        Hypothyroidism    On thyroid replacement.  Follow tsh.       Neuropathy    Stable on gabapentin.  OSA (obstructive sleep apnea)    Continue CPAP.       Pulmonary hypertension ([place])    Followed by cardiology.  Also has seen pulmonary.  Treat sleep apnea.       Stress    Followed by Dr Nicolasa Ducking.  Increased stress with her husband's health issues.         Other Visit Diagnoses    Elevated alkaline phosphatase level    -  Primary   Relevant Orders   Hepatic function panel (Completed)       Einar Pheasant, MD

## 2018-10-18 ENCOUNTER — Encounter: Payer: Self-pay | Admitting: Internal Medicine

## 2018-10-18 NOTE — Assessment & Plan Note (Signed)
Blood pressure under good control.  Continue same medication regimen.  Follow pressures.  Follow metabolic panel.   

## 2018-10-18 NOTE — Assessment & Plan Note (Signed)
On thyroid replacement.  Follow tsh.  

## 2018-10-18 NOTE — Assessment & Plan Note (Signed)
Followed by cardiology.  Also has seen pulmonary.  Treat sleep apnea.

## 2018-10-18 NOTE — Assessment & Plan Note (Signed)
Seeing GI.  Planning for EGD soon.  Continue current medication regimen.  Follow.

## 2018-10-18 NOTE — Assessment & Plan Note (Signed)
Followed by hematology 

## 2018-10-18 NOTE — Assessment & Plan Note (Signed)
Continue CPAP.

## 2018-10-18 NOTE — Assessment & Plan Note (Signed)
Continue compression hose.

## 2018-10-18 NOTE — Assessment & Plan Note (Signed)
Controlled on current regimen.

## 2018-10-18 NOTE — Assessment & Plan Note (Signed)
No evidence of volume overload.  Stable.  Sees cardiology.

## 2018-10-18 NOTE — Assessment & Plan Note (Signed)
Followed by Dr Nicolasa Ducking.  Increased stress with her husband's health issues.

## 2018-10-18 NOTE — Assessment & Plan Note (Signed)
Stable on gabapentin.

## 2018-10-18 NOTE — Assessment & Plan Note (Signed)
On lipitor.  Low cholesterol diet and exercise.  Follow lipid panel and liver function tests.   

## 2018-10-20 ENCOUNTER — Encounter: Payer: Self-pay | Admitting: *Deleted

## 2018-10-20 ENCOUNTER — Other Ambulatory Visit: Payer: Self-pay | Admitting: Internal Medicine

## 2018-10-20 ENCOUNTER — Other Ambulatory Visit: Payer: Self-pay | Admitting: *Deleted

## 2018-10-20 ENCOUNTER — Telehealth: Payer: Self-pay | Admitting: Internal Medicine

## 2018-10-20 DIAGNOSIS — R748 Abnormal levels of other serum enzymes: Secondary | ICD-10-CM | POA: Diagnosis not present

## 2018-10-20 DIAGNOSIS — Z1231 Encounter for screening mammogram for malignant neoplasm of breast: Secondary | ICD-10-CM

## 2018-10-20 NOTE — Patient Outreach (Signed)
[facility]) Care Management [place]- SNF discharge day # 58  10/20/2018  Kimberly Roy Jan 10, 1931 [id number]  Successful telephone outreach Kimberly Roy, 85 y/o female referred to [facility] by [facility] PAC on 08/25/18; PAC received original referral/ notification of patient discharge from SNF to home after recent SNF rehabilitation visit post-hospital visit/ discharge December 6-9, 2019 for mechanical fall resulting in (R) femur/ hip fracture requiring ORIF with post-operative anemia.Patient has history including, but not limited to, HTN/ HLD; CHF; OSA/ pulmonary HTN, on O2 2 L/min via [facility] q HS; GERD. HIPAA/ identity verified.  Today, patient reportsthat she is doing "excellent."  Patient denies new/ recent falls and reports that she continues working with home health PT team and weight bearing and is now primarily using walker for ambulation; denies new/ recent falls, and positive reinforcement was provided for her progress in weight bearing/ ambulation and we discussed ongoing home health services, which she expects to remain active for "another week or two."  Patient confirms that she attended recent PCP office visit on 10/15/2018, stated that visit went well, without changes to overall plan of care.  Reports that she plans to have endoscopy as planned at end of this month.  Patient states that she is unable to talk any longer today, as she has a GI provider appointment and her transportation has just arrived.  We agreed that I would re-contact her in 2 weeks for update/ possible case closure.  Patient denies further issues, concerns, or problems today.  I confirmed that patient has my direct phone number, the main [facility] CM office phone number, and the [facility] CM 24-hour nurse advice phone number should issues arise prior to next scheduled [place] outreach.  Encouraged patient to contact me directly if needs, questions, issues, or concerns  arise prior to next scheduled outreach; patient agreed to do so.  Plan:  Patient will take medications as prescribed and will attend all scheduled provider appointments  Patient will promptly notify care providers for any new concerns/ issues/ problems that arise  Patient will actively participate in home health services as ordered post-hospital discharge  [place] outreachto continue with scheduled phone call in 2 weeks, possibly for case closure  [facility] CM Care Plan Problem Two     Most Recent Value  Care Plan Problem Two  High fall risk related to/ as evidenced by recent fall with injury and subsequent mobility restrictions requiring patient to be in wheelchair  Role Documenting the Problem Two  Care Management [place] for Problem Two  Active  Interventions for Problem Two Long Term Goal   Discussed with patient her progress with home health PT and confirmed that she has now been using walker for most of ambulation,  confirmed that patient has had no new/ recent falls since she began using walker for ambulation  [facility] Long Term Goal  Over the next 14 days, patient will use assistive devices as indicated and instructed post- recent ORIF due to hip/ femur fracture, as evidenced by patient reporting during [place] outreach [Goal extended today]  [facility] Long Term Goal Start Date  10/20/18  [facility] CM Short Term Goal #1   Over the next 14 days, patient will actively participate with home health PT services to increase activity and weight bearing, as evidenced by patient reporting and collaboration with home health team as indicated during [facility] RN CCM outreach [Goal extended today]  [facility] CM  Short Term Goal #1 Start Date  10/20/18  Interventions for Short Term Goal #2   Discussed wiht patient her progress with home health PT and confirmed that she continues actively participating in home health PT services,  discussed extension of home health PT services and encouraged her ongoing  participation     Oneta Rack, RN, BSN, [employer] Coordinator [facility] Care Management  [phone]

## 2018-10-20 NOTE — Telephone Encounter (Signed)
Copied from [place] [id number]. Topic: Quick Communication - Home Health Verbal Orders >> Oct 20, 2018  9:39 AM Margot Ables wrote: Caller/Agency: Joya Gaskins PT w/[facility] Callback Number: [phone], secure VM if not available Requesting OT/PT/Skilled Nursing/Social Work: PT Frequency: requesting VO for PT 2 additional visits, 1 week 2 for gait and strength, pt has not met goal

## 2018-10-20 NOTE — Telephone Encounter (Signed)
Called Joya Gaskins, PT w/ [facility] and gave verbal orders as requested.

## 2018-10-21 ENCOUNTER — Inpatient Hospital Stay: Payer: PPO

## 2018-10-21 ENCOUNTER — Ambulatory Visit: Payer: Self-pay | Admitting: Internal Medicine

## 2018-10-21 ENCOUNTER — Telehealth (INDEPENDENT_AMBULATORY_CARE_PROVIDER_SITE_OTHER): Payer: Self-pay | Admitting: Vascular Surgery

## 2018-10-21 ENCOUNTER — Inpatient Hospital Stay: Payer: PPO | Admitting: Internal Medicine

## 2018-10-21 NOTE — Telephone Encounter (Signed)
See triage notes below.  I returned pt's call.  Reason for Disposition . Caller has NON-URGENT medication question about med that PCP prescribed and triager unable to answer question  Answer Assessment - Initial Assessment Questions 1. SYMPTOMS: "Do you have any symptoms?"     No symptoms just a question:  She is taking a total of 3,000 IU of vitamin D3.   She takes 2 1,000 IU pills and 1 Citracal +D with 1,000 IU of D3.   Should she only take 1 of the vitamin D3 pills so she gets a total of 2,000 IU?    She said Dr. Nicki Reaper wanted her to take 2,000 IU.     She did not realize she was taking 3,000 IU of D3 until the physical therapist went over her meds with her. 2. SEVERITY: If symptoms are present, ask "Are they mild, moderate or severe?"     No symptoms.  Protocols used: MEDICATION QUESTION CALL-A-AH  I have routed my triage notes to Dr. Bary Leriche nurse pool for further disposition.

## 2018-10-21 NOTE — Telephone Encounter (Signed)
Received a phone call from the gastroenterology physician assistant [facility] clinic.  The patient is currently being ruled out for a gastric ulcer by endoscopy however does have a 70% stenosis of her SMA.  The patient continues to be symptomatic.  I will have our office call the patient and set up an appointment with a mesenteric duplex.

## 2018-10-21 NOTE — Telephone Encounter (Signed)
Told pt that she has been on this for quite some time. It should be fine for her to continue. Pt stated that PT just wanted to double check

## 2018-10-30 ENCOUNTER — Ambulatory Visit: Payer: PPO | Admitting: Anesthesiology

## 2018-10-30 ENCOUNTER — Encounter: Payer: Self-pay | Admitting: *Deleted

## 2018-10-30 ENCOUNTER — Ambulatory Visit
Admission: RE | Admit: 2018-10-30 | Discharge: 2018-10-30 | Disposition: A | Discharge status: Home / Self Care | Payer: PPO | Attending: Gastroenterology | Admitting: Gastroenterology

## 2018-10-30 ENCOUNTER — Encounter
Admission: RE | Disposition: A | Discharge status: Home / Self Care | Payer: Self-pay | Source: Home / Self Care | Attending: Gastroenterology

## 2018-10-30 DIAGNOSIS — Z888 Allergy status to other drugs, medicaments and biological substances status: Secondary | ICD-10-CM | POA: Insufficient documentation

## 2018-10-30 DIAGNOSIS — Z882 Allergy status to sulfonamides status: Secondary | ICD-10-CM | POA: Diagnosis not present

## 2018-10-30 DIAGNOSIS — E039 Hypothyroidism, unspecified: Secondary | ICD-10-CM | POA: Insufficient documentation

## 2018-10-30 DIAGNOSIS — K3189 Other diseases of stomach and duodenum: Secondary | ICD-10-CM | POA: Diagnosis not present

## 2018-10-30 DIAGNOSIS — M1990 Unspecified osteoarthritis, unspecified site: Secondary | ICD-10-CM | POA: Insufficient documentation

## 2018-10-30 DIAGNOSIS — I110 Hypertensive heart disease with heart failure: Secondary | ICD-10-CM | POA: Diagnosis not present

## 2018-10-30 DIAGNOSIS — F329 Major depressive disorder, single episode, unspecified: Secondary | ICD-10-CM | POA: Diagnosis not present

## 2018-10-30 DIAGNOSIS — K2950 Unspecified chronic gastritis without bleeding: Secondary | ICD-10-CM | POA: Diagnosis not present

## 2018-10-30 DIAGNOSIS — Z86718 Personal history of other venous thrombosis and embolism: Secondary | ICD-10-CM | POA: Diagnosis not present

## 2018-10-30 DIAGNOSIS — E7800 Pure hypercholesterolemia, unspecified: Secondary | ICD-10-CM | POA: Diagnosis not present

## 2018-10-30 DIAGNOSIS — D649 Anemia, unspecified: Secondary | ICD-10-CM | POA: Insufficient documentation

## 2018-10-30 DIAGNOSIS — K224 Dyskinesia of esophagus: Secondary | ICD-10-CM | POA: Diagnosis not present

## 2018-10-30 DIAGNOSIS — G4733 Obstructive sleep apnea (adult) (pediatric): Secondary | ICD-10-CM | POA: Insufficient documentation

## 2018-10-30 DIAGNOSIS — Z79899 Other long term (current) drug therapy: Secondary | ICD-10-CM | POA: Diagnosis not present

## 2018-10-30 DIAGNOSIS — K228 Other specified diseases of esophagus: Secondary | ICD-10-CM | POA: Diagnosis not present

## 2018-10-30 DIAGNOSIS — Z91048 Other nonmedicinal substance allergy status: Secondary | ICD-10-CM | POA: Insufficient documentation

## 2018-10-30 DIAGNOSIS — R0602 Shortness of breath: Secondary | ICD-10-CM | POA: Insufficient documentation

## 2018-10-30 DIAGNOSIS — I272 Pulmonary hypertension, unspecified: Secondary | ICD-10-CM | POA: Insufficient documentation

## 2018-10-30 DIAGNOSIS — F419 Anxiety disorder, unspecified: Secondary | ICD-10-CM | POA: Insufficient documentation

## 2018-10-30 DIAGNOSIS — K760 Fatty (change of) liver, not elsewhere classified: Secondary | ICD-10-CM | POA: Insufficient documentation

## 2018-10-30 DIAGNOSIS — E785 Hyperlipidemia, unspecified: Secondary | ICD-10-CM | POA: Insufficient documentation

## 2018-10-30 DIAGNOSIS — Z881 Allergy status to other antibiotic agents status: Secondary | ICD-10-CM | POA: Insufficient documentation

## 2018-10-30 DIAGNOSIS — B379 Candidiasis, unspecified: Secondary | ICD-10-CM | POA: Diagnosis not present

## 2018-10-30 DIAGNOSIS — K319 Disease of stomach and duodenum, unspecified: Secondary | ICD-10-CM | POA: Insufficient documentation

## 2018-10-30 DIAGNOSIS — K210 Gastro-esophageal reflux disease with esophagitis: Secondary | ICD-10-CM | POA: Insufficient documentation

## 2018-10-30 DIAGNOSIS — K2940 Chronic atrophic gastritis without bleeding: Secondary | ICD-10-CM | POA: Diagnosis not present

## 2018-10-30 DIAGNOSIS — N281 Cyst of kidney, acquired: Secondary | ICD-10-CM | POA: Diagnosis not present

## 2018-10-30 DIAGNOSIS — I509 Heart failure, unspecified: Secondary | ICD-10-CM | POA: Diagnosis not present

## 2018-10-30 DIAGNOSIS — Z87442 Personal history of urinary calculi: Secondary | ICD-10-CM | POA: Diagnosis not present

## 2018-10-30 DIAGNOSIS — M5030 Other cervical disc degeneration, unspecified cervical region: Secondary | ICD-10-CM | POA: Diagnosis not present

## 2018-10-30 DIAGNOSIS — K5900 Constipation, unspecified: Secondary | ICD-10-CM | POA: Insufficient documentation

## 2018-10-30 DIAGNOSIS — R1084 Generalized abdominal pain: Secondary | ICD-10-CM | POA: Diagnosis present

## 2018-10-30 DIAGNOSIS — R109 Unspecified abdominal pain: Secondary | ICD-10-CM | POA: Diagnosis not present

## 2018-10-30 HISTORY — PX: ESOPHAGOGASTRODUODENOSCOPY (EGD) WITH PROPOFOL: SHX5813

## 2018-10-30 LAB — HM COLONOSCOPY

## 2018-10-30 LAB — KOH PREP: KOH Prep: NONE SEEN

## 2018-10-30 SURGERY — ESOPHAGOGASTRODUODENOSCOPY (EGD) WITH PROPOFOL
Anesthesia: General

## 2018-10-30 MED ORDER — PROPOFOL 500 MG/50ML IV EMUL
INTRAVENOUS | Status: AC
  Filled 2018-10-30: qty 50

## 2018-10-30 MED ORDER — SODIUM CHLORIDE 0.9 % IV SOLN
INTRAVENOUS | Status: DC
  Administered 2018-10-30: 10:00:00 via INTRAVENOUS

## 2018-10-30 MED ORDER — PROPOFOL 10 MG/ML IV BOLUS
INTRAVENOUS | Status: AC
  Filled 2018-10-30: qty 20

## 2018-10-30 MED ORDER — SODIUM CHLORIDE 0.9 % IV SOLN
INTRAVENOUS | Status: DC
  Administered 2018-10-30: 1000 mL via INTRAVENOUS

## 2018-10-30 MED ORDER — PROPOFOL 500 MG/50ML IV EMUL
INTRAVENOUS | Status: DC | PRN
  Administered 2018-10-30: 175 ug/kg/min via INTRAVENOUS

## 2018-10-30 MED ORDER — PHENYLEPHRINE HCL 10 MG/ML IJ SOLN
INTRAMUSCULAR | Status: DC | PRN
  Administered 2018-10-30: 50 ug via INTRAVENOUS

## 2018-10-30 MED ORDER — LIDOCAINE HCL (CARDIAC) PF 100 MG/5ML IV SOSY
PREFILLED_SYRINGE | INTRAVENOUS | Status: DC | PRN
  Administered 2018-10-30: 50 mg via INTRAVENOUS

## 2018-10-30 MED ORDER — EPHEDRINE SULFATE 50 MG/ML IJ SOLN
INTRAMUSCULAR | Status: DC | PRN
  Administered 2018-10-30: 10 mg via INTRAVENOUS

## 2018-10-30 NOTE — Anesthesia Preprocedure Evaluation (Signed)
Anesthesia Evaluation  Patient identified by MRN, date of birth, ID band Patient awake    Reviewed: Allergy & Precautions, H&P , NPO status , Patient's Chart, lab work & pertinent test results, reviewed documented beta blocker date and time   History of Anesthesia Complications Negative for: history of anesthetic complications  Airway Mallampati: II  TM Distance: >3 FB Neck ROM: full    Dental  (+) Dental Advidsory Given Veneers at the top:   Pulmonary shortness of breath and with exertion, sleep apnea , neg COPD, neg recent URI,           Cardiovascular Exercise Tolerance: Good hypertension, Pt. on medications and Pt. on home beta blockers (-) angina+CHF  (-) CAD, (-) Past MI, (-) Cardiac Stents and (-) CABG (-) dysrhythmias (-) Valvular Problems/Murmurs     Neuro/Psych  Headaches, PSYCHIATRIC DISORDERS Anxiety Depression negative neurological ROS  negative psych ROS   GI/Hepatic Neg liver ROS, GERD  ,  Endo/Other  negative endocrine ROSHypothyroidism   Renal/GU Renal disease  negative genitourinary   Musculoskeletal  (+) Arthritis , Osteoarthritis,    Abdominal   Peds negative pediatric ROS (+)  Hematology negative hematology ROS (+) Blood dyscrasia, anemia ,   Anesthesia Other Findings Past Medical History: No date: Anemia No date: Anxiety No date: Chest pain No date: CHF (congestive heart failure) (HCC) No date: Constipation No date: DDD (degenerative disc disease), cervical No date: Depression No date: DVT (deep venous thrombosis) (HCC) No date: Dysphonia No date: Dyspnea No date: Fatty liver No date: Fatty liver No date: Headache No date: Hyperlipidemia No date: Hyperpiesia No date: Hypertension No date: Hypothyroidism No date: Interstitial cystitis No date: Left ventricular dysfunction No date: Lymphedema No date: Nephrolithiasis No date: Obstructive sleep apnea No date: Osteoarthritis  Comment:  knees/cervical and lumbar spine No date: Pulmonary hypertension (HCC) No date: Pulmonary nodules     Comment:  followed by Dr Raul Del No date: Pure hypercholesterolemia No date: Renal cyst     Comment:  Right  Fusion from thoracic to S1...  Reproductive/Obstetrics negative OB ROS                             Anesthesia Physical  Anesthesia Plan  ASA: III  Anesthesia Plan: General   Post-op Pain Management:    Induction: Intravenous  PONV Risk Score and Plan: 3 and Propofol infusion and TIVA  Airway Management Planned: Natural Airway and Nasal Cannula  Additional Equipment:   Intra-op Plan:   Post-operative Plan:   Informed Consent: I have reviewed the patients History and Physical, chart, labs and discussed the procedure including the risks, benefits and alternatives for the proposed anesthesia with the patient or authorized representative who has indicated his/her understanding and acceptance.     Dental Advisory Given  Plan Discussed with: CRNA  Anesthesia Plan Comments: (Despite risks of possible post op ventilation, family and patient prefer GOT.)        Anesthesia Quick Evaluation

## 2018-10-30 NOTE — Anesthesia Postprocedure Evaluation (Signed)
Anesthesia Post Note  Patient: Kimberly Roy  Procedure(s) Performed: ESOPHAGOGASTRODUODENOSCOPY (EGD) WITH PROPOFOL (N/A )  Patient location during evaluation: Endoscopy Anesthesia Type: General Level of consciousness: awake and alert Pain management: pain level controlled Vital Signs Assessment: post-procedure vital signs reviewed and stable Respiratory status: spontaneous breathing, nonlabored ventilation, respiratory function stable and patient connected to nasal cannula oxygen Cardiovascular status: blood pressure returned to baseline and stable Postop Assessment: no apparent nausea or vomiting Anesthetic complications: no     Last Vitals:  Vitals:   10/30/18 1100 10/30/18 1110  BP: (!) 142/66 (!) 146/64  Pulse: 60 (!) 58  Resp: 16 17  Temp:    SpO2: 100% 100%    Last Pain:  Vitals:   10/30/18 1040  TempSrc: Tympanic  PainSc:                  Martha Clan

## 2018-10-30 NOTE — H&P (Signed)
Outpatient short stay form Pre-procedure 10/30/2018 10:03 AM Kimberly Sails MD  Primary Physician: Einar Pheasant, MD  Reason for visit: EGD  History of present illness: Patient is presenting today for an EGD in regards to complaint of diffuse abdominal pain.  Dates that that it has been occurring after eating a year but has worsened more recently.  She had a CT scan that is concerning for changes of mesenteric ischemia and had been referred to vascular surgery.  Wanting further evaluation along that line to rule out Tritus or peptic ulcer disease.  Has been on a proton pump inhibitor orally for quite some time.  Further discussion with her she may have some fear of food at times.  Patient does have a history of dysphagia in the past however underwent treatment at [facility] with Botox injections for achalasia which have been beneficial and she currently denies any difficulty with swallowing.  She takes no aspirin or blood thinning agent with the exception of 81 mg aspirin.  Last time yesterday.    Current Facility-Administered Medications:  .  0.9 %  sodium chloride infusion, , Intravenous, Continuous, Kimberly Sails, MD .  0.9 %  sodium chloride infusion, , Intravenous, Continuous, Kimberly Sails, MD, Last Rate: 20 mL/hr at 10/30/18 0929, 1,000 mL at 10/30/18 2355  Medications Prior to Admission  Medication Sig Dispense Refill Last Dose  . amLODipine (NORVASC) 10 MG tablet TAKE 1 TABLET BY MOUTH DAILY 90 tablet 1 10/30/2018 at 0600  . atorvastatin (LIPITOR) 20 MG tablet TAKE ONE TABLET BY MOUTH AT BEDTIME 90 tablet 1 10/29/2018 at Unknown time  . azelastine (ASTELIN) 0.1 % nasal spray Place 1 spray into both nostrils 2 (two) times daily. Use in each nostril as directed 30 mL 1 Past Week at Unknown time  . benazepril (LOTENSIN) 20 MG tablet Take 1 tablet (20 mg total) by mouth daily. 20 tablet 0 10/30/2018 at 0600  . busPIRone (BUSPAR) 10 MG tablet Take 10 mg by mouth 2 (two)  times daily.    10/30/2018 at 0600  . calcium citrate-vitamin D (CITRACAL+D) 315-200 MG-UNIT tablet Take 1 tablet by mouth daily.   10/29/2018 at Unknown time  . Casanthranol-Docusate Sodium 30-100 MG CAPS Take 1 capsule by mouth daily.    10/29/2018 at Unknown time  . Cholecalciferol (VITAMIN D3) 1000 UNITS CAPS Take by mouth.   10/29/2018 at Unknown time  . DULoxetine (CYMBALTA) 60 MG capsule TAKE 1 CAPSULE BY MOUTH EVERY DAY. 30 capsule 3 10/29/2018 at Unknown time  . gabapentin (NEURONTIN) 300 MG capsule Take 600 mg by mouth 2 (two) times daily.    10/29/2018 at Unknown time  . HYDROcodone-acetaminophen (NORCO) 10-325 MG tablet Take 1 tablet by mouth 3 (three) times daily. 10 tablet 0 10/29/2018 at Unknown time  . levothyroxine (SYNTHROID, LEVOTHROID) 100 MCG tablet Take 1 tablet (100 mcg total) by mouth daily. 90 tablet 3 10/29/2018 at Unknown time  . magnesium oxide (MAG-OX) 400 MG tablet Take 1 tablet (400 mg total) by mouth daily. 30 tablet 1 10/29/2018 at Unknown time  . metoprolol succinate (TOPROL-XL) 50 MG 24 hr tablet Take 2 tablets by mouth in the morning and 1 tablet by mouth in the evening. 270 tablet 1 10/30/2018 at 0600  . morphine (MS CONTIN) 60 MG 12 hr tablet Take 1 tablet (60 mg total) by mouth 2 (two) times daily. 10 tablet 0 10/30/2018 at 0600  . Naldemedine Tosylate (SYMPROIC) 0.2 MG TABS Take 0.2 mg by  mouth daily.   10/29/2018 at Unknown time  . Probiotic Product (ALIGN PO) Take by mouth daily.   10/29/2018 at Unknown time  . ranitidine (ZANTAC) 300 MG tablet Take 300 mg by mouth 2 (two) times daily.   10/29/2018 at Unknown time  . senna (SENOKOT) 8.6 MG TABS tablet Take 1 tablet by mouth 2 (two) times daily.   10/29/2018 at Unknown time  . Simethicone (GAS-X PO) Take 2 tablets by mouth 2 (two) times daily.   10/29/2018 at Unknown time  . sodium chloride (OCEAN) 0.65 % nasal spray Place 1 spray into the nose as needed.   10/29/2018 at Unknown time  . triamcinolone cream (KENALOG) 0.1 %     10/29/2018 at Unknown time  . aluminum-magnesium hydroxide-simethicone (MAALOX) 355-217-47 MG/5ML SUSP Take 15 mLs by mouth 4 (four) times daily -  before meals and at bedtime.     at prn  . FLUoxetine (PROZAC) 20 MG tablet    Not Taking at Unknown time  . fluticasone (FLONASE) 50 MCG/ACT nasal spray    Not Taking at Unknown time  . hydrochlorothiazide (MICROZIDE) 12.5 MG capsule Take 12.5 mg by mouth daily.   Not Taking at Unknown time  . Inulin (FIBER CHOICE PO) Take 2 tablets by mouth daily. Chew tablets   Taking  . pantoprazole (PROTONIX) 40 MG tablet TAKE 1 TABLET BY MOUTH TWICE A DAY. (Patient not taking: Reported on 10/30/2018) 60 tablet 3 Not Taking at Unknown time     Allergies  Allergen Reactions  . Lyrica [Pregabalin] Swelling  . Atarax [Hydroxyzine]     jittery  . Dicyclomine Other (See Comments)    Abdominal bloating   . Hydroxyzine Hcl     jittery  . Levaquin [Levofloxacin] Swelling  . Nucynta Er [Tapentadol Hcl Er] Other (See Comments)    Severe constipation   . Oxybutynin Other (See Comments)    Blurred vision  . Zoloft [Sertraline Hcl]     Severe headache  . Biaxin [Clarithromycin] Other (See Comments) and Rash    Pt does not remember Pt does not remember  . Sertraline Nausea And Vomiting    Severe headache Severe headache Other reaction(s): Headache Severe headache  . Sulfa Antibiotics Rash    Pt does not remember  . Sulfasalazine Rash    Pt does not remember  . Tape Rash    [place] - redness  . Tapentadol Other (See Comments) and Rash    _0      Past Medical History:  Diagnosis Date  . Anemia   . Anxiety   . Chest pain   . CHF (congestive heart failure) ([place])   . Constipation   . DDD (degenerative disc disease), cervical   . Depression   . DVT (deep venous thrombosis) ([place])   . Dysphonia   . Dyspnea   . Fatty liver   . Fatty liver   .  Headache   . Hyperlipidemia   . Hyperpiesia   . Hypertension   . Hypothyroidism   . Interstitial cystitis   . Left ventricular dysfunction   . Lymphedema   . Nephrolithiasis   . Obstructive sleep apnea   . Osteoarthritis    knees/cervical and lumbar spine  . Pulmonary hypertension ([place])   . Pulmonary nodules    followed by Dr Raul Del  . Pure hypercholesterolemia   . Renal cyst    right    Review of systems:  Physical Exam    Heart and lungs: Regular rate and rhythm without rub or gallop, lungs are bilaterally clear.    HEENT: Normocephalic atraumatic eyes are anicteric    Other:    Pertinant exam for procedure: Soft nontender nondistended bowel sounds positive normoactive    Planned proceedures: EGD and indicated procedures. I have discussed the risks benefits and complications of procedures to include not limited to bleeding, infection, perforation and the risk of sedation and the patient wishes to proceed.    Kimberly Sails, MD Gastroenterology 10/30/2018  10:03 AM

## 2018-10-30 NOTE — Anesthesia Post-op Follow-up Note (Signed)
Anesthesia QCDR form completed.

## 2018-10-30 NOTE — Op Note (Signed)
[facility] Gastroenterology Patient Name: Kimberly Roy Procedure Date: 10/30/2018 9:58 AM MRN: [id number] Account #: [id number] Date of Birth: 08-30-1931 Admit Type: Outpatient Age: 85 Room: [facility] ENDO ROOM 3 Gender: Female Note Status: Finalized Procedure:            Upper GI endoscopy Indications:          Generalized abdominal pain Providers:            Lollie Sails, MD Referring MD:         Einar Pheasant, MD (Referring MD) Medicines:            Monitored Anesthesia Care Complications:        No immediate complications. Procedure:            Pre-Anesthesia Assessment:                       - ASA Grade Assessment: III - A patient with severe                        systemic disease.                       After obtaining informed consent, the endoscope was                        passed under direct vision. Throughout the procedure,                        the patient's blood pressure, pulse, and oxygen                        saturations were monitored continuously. The Endoscope                        was introduced through the mouth, and advanced to the                        third part of duodenum. The upper GI endoscopy was                        accomplished without difficulty. The patient tolerated                        the procedure well. Findings:      The Z-line was variable. Biopsies were taken with a cold forceps for       histology.      Abnormal motility was noted in the mid esophagus and in the distal       esophagus. The cricopharyngeus was normal. There are extra peristaltic       waves in the esophageal body. The distal esophagus/lower esophageal       sphincter is spastic, but gives up passage to the endoscope. Tertiary       peristaltic waves are noted.      Patchy moderate inflammation characterized by erythema was found in the       gastric antrum. Biopsies were taken with a cold forceps for histology.      The exam of the stomach was  otherwise normal.      Biopsies were taken with a cold forceps in the gastric body for       histology.  Diffuse moderate mucosal variance characterized by atrophy and       smoothness was found in the entire duodenum. Biopsies were taken with a       cold forceps for histology from the 2 to 3rd portion in one jar and from       the duodenal bulb in a separate jar.      The pyloric opening appears wide open, with no motility.      Patchy, white plaques were found in the mid esophagus. Cells for       cytology were obtained by brushing. Impression:           - Z-line variable. Biopsied.                       - Abnormal esophageal motility.                       - Atrophic gastritis. Biopsied.                       - Mucosal variant in the duodenum. Biopsied.                       - Biopsies were taken with a cold forceps for histology                        in the gastric body. Recommendation:       - Discharge patient to home.                       - Soft diet today, then advance as tolerated to advance                        diet as tolerated.                       - Patient to continue care of vascular subspeciality. Procedure Code(s):    --- Professional ---                       [phone], Esophagogastroduodenoscopy, flexible, transoral;                        with biopsy, single or multiple Diagnosis Code(s):    --- Professional ---                       K22.8, Other specified diseases of esophagus                       K22.4, Dyskinesia of esophagus                       K29.40, Chronic atrophic gastritis without bleeding                       K31.89, Other diseases of stomach and duodenum                       R10.84, Generalized abdominal pain CPT copyright 2018 American Medical Association. All rights reserved. The codes documented in this report are preliminary and upon coder review may  be revised to meet current compliance requirements. Lollie Sails, MD 10/30/2018  10:46:23  AM This report has been signed electronically. Number of Addenda: 0 Note Initiated On: 10/30/2018 9:58 AM      [facility]

## 2018-10-30 NOTE — Transfer of Care (Signed)
Immediate Anesthesia Transfer of Care Note  Patient: Kimberly Roy  Procedure(s) Performed: ESOPHAGOGASTRODUODENOSCOPY (EGD) WITH PROPOFOL (N/A )  Patient Location: Endoscopy Unit  Anesthesia Type:General  Level of Consciousness: awake, alert  and oriented  Airway & Oxygen Therapy: Patient Spontanous Breathing and Patient connected to nasal cannula oxygen  Post-op Assessment: Report given to RN and Post -op Vital signs reviewed and stable  Post vital signs: Reviewed and stable  Last Vitals:  Vitals Value Taken Time  BP    Temp    Pulse    Resp    SpO2      Last Pain:  Vitals:   10/30/18 0903  TempSrc: Tympanic  PainSc: 0-No pain         Complications: No apparent anesthesia complications

## 2018-10-31 DIAGNOSIS — S72341D Displaced spiral fracture of shaft of right femur, subsequent encounter for closed fracture with routine healing: Secondary | ICD-10-CM | POA: Diagnosis not present

## 2018-11-02 ENCOUNTER — Other Ambulatory Visit: Payer: Self-pay | Admitting: *Deleted

## 2018-11-02 ENCOUNTER — Encounter: Payer: Self-pay | Admitting: *Deleted

## 2018-11-02 LAB — SURGICAL PATHOLOGY

## 2018-11-02 NOTE — Patient Outreach (Signed)
[facility]) Care Management [place] Telephone Outreach- Case Closure Post-SNF discharge day # 71  11/02/2018  Kimberly Roy 12-03-1930 [id number]  Successful telephone outreach Kimberly Roy, 85 y/o female referred to [facility] by [facility] PAC on 08/25/18; PAC received original referral/ notification of patient discharge from SNF to home after recent SNF rehabilitation visit post-hospital visit/ discharge December 6-9, 2019 for mechanical fall resulting in (R) femur/ hip fracture requiring ORIF with post-operative anemia.Patient has history including, but not limited to, HTN/ HLD; CHF; OSA/ pulmonary HTN, on O2 2 L/min via [place] q HS; GERD. HIPAA/ identity verified.  Today, patient reportsthat she continues doing"excellent."  Patient denies new/ recent falls and reports that she continues completing exercises at home which she learned from home health PT team, who has now signed off.  Patient reports that she continues walking every day, short distances, "going slow" and she confirms that she continues using assistive devices as indicated when she leaves the home.  Patient also confirms that she and her husband continue having private duty care assistance in the home, and she reports this is more for her husband than for herself, as she "has made such great progress."  Patient confirms that she attended recent endoscopy and reports "it went well" and that "nothing major was found."  We reviewed recent and upcoming provider appointments, and patient verbalizes a good understanding of same with plans to attend all; confirms that faily continues to provide transportation to all provider appointments.  Patient denies further issues, concerns, or problems today.  Discussed again today with patient that she has thus far successfully met her previously established [place] CM goals; patient agrees with [place] case closure and she denies further care coordination  needs.  Confirmed that patient has my direct phone number, the [facility] CM main office phone number, and the 24-hour nurse advice line should issues arise in the future.  Encouraged patient to maintain regular communication with care providers on ongoing basis, and to promptly notify for any concerns that arise  Plan:  Will close [place] CM case, as patient has successfully met her previously established goals and denies further care coordination needs, and will make patient's PCP aware of same  [facility] CM Care Plan Problem Two     Most Recent Value  Care Plan Problem Two  High fall risk related to/ as evidenced by recent fall with injury and subsequent mobility restrictions requiring patient to be in wheelchair  Role Documenting the Problem Two  Care Management [place] for Problem Two  Not Active  Interventions for Problem Two Long Term Goal   Discussed with patient her progress with ambulation and weight bearing,  confirmed that patient contiues to use assistive devices as indicated and using teach back method, re-reviewed with patient previously provided education around fall risks/ prevention  [facility] Long Term Goal  Over the next 14 days, patient will use assistive devices as indicated and instructed post- recent ORIF due to hip/ femur fracture, as evidenced by patient reporting during [place] outreach  [facility] Long Term Goal Start Date  10/20/18  [facility] Long Term Goal Met Date  11/02/18 [Goal Met]  [facility] CM Short Term Goal #1   Over the next 14 days, patient will actively participate with home health PT services to increase activity and weight bearing, as evidenced by patient reporting and collaboration with home health team as indicated during [place] outreach  [facility]  CM Short Term Goal #1 Start Date  10/20/18  [facility] CM Short Term Goal #1 Met Date   11/02/18 [Goal Met]  Interventions for Short Term Goal #2   Confirmed with patient that home health services have now been completed and that  patient continues doing exercises at home independently- positive reinforcement provided     It has been a pleasure caring for Kimberly Pages, RN, BSN, [place] Coordinator [facility] Care Management  [phone]

## 2018-11-03 ENCOUNTER — Inpatient Hospital Stay: Payer: PPO | Attending: Internal Medicine

## 2018-11-03 ENCOUNTER — Other Ambulatory Visit: Payer: Self-pay

## 2018-11-03 ENCOUNTER — Inpatient Hospital Stay (HOSPITAL_BASED_OUTPATIENT_CLINIC_OR_DEPARTMENT_OTHER): Payer: PPO | Admitting: Internal Medicine

## 2018-11-03 DIAGNOSIS — I110 Hypertensive heart disease with heart failure: Secondary | ICD-10-CM

## 2018-11-03 DIAGNOSIS — R5383 Other fatigue: Secondary | ICD-10-CM

## 2018-11-03 DIAGNOSIS — D500 Iron deficiency anemia secondary to blood loss (chronic): Secondary | ICD-10-CM | POA: Diagnosis not present

## 2018-11-03 DIAGNOSIS — Z8249 Family history of ischemic heart disease and other diseases of the circulatory system: Secondary | ICD-10-CM | POA: Insufficient documentation

## 2018-11-03 DIAGNOSIS — Z79899 Other long term (current) drug therapy: Secondary | ICD-10-CM

## 2018-11-03 DIAGNOSIS — D509 Iron deficiency anemia, unspecified: Secondary | ICD-10-CM

## 2018-11-03 LAB — CBC WITH DIFFERENTIAL/PLATELET
Abs Immature Granulocytes: 0.01 10*3/uL (ref 0.00–0.07)
BASOS ABS: 0 10*3/uL (ref 0.0–0.1)
Basophils Relative: 1 %
Eosinophils Absolute: 0.1 10*3/uL (ref 0.0–0.5)
Eosinophils Relative: 2 %
HCT: 33 % — ABNORMAL LOW (ref 36.0–46.0)
Hemoglobin: 10.6 g/dL — ABNORMAL LOW (ref 12.0–15.0)
Immature Granulocytes: 0 %
Lymphocytes Relative: 30 %
Lymphs Abs: 2 10*3/uL (ref 0.7–4.0)
MCH: 31.5 pg (ref 26.0–34.0)
MCHC: 32.1 g/dL (ref 30.0–36.0)
MCV: 98.2 fL (ref 80.0–100.0)
Monocytes Absolute: 0.5 10*3/uL (ref 0.1–1.0)
Monocytes Relative: 7 %
NEUTROS ABS: 4 10*3/uL (ref 1.7–7.7)
Neutrophils Relative %: 60 %
Platelets: 156 10*3/uL (ref 150–400)
RBC: 3.36 MIL/uL — ABNORMAL LOW (ref 3.87–5.11)
RDW: 14.4 % (ref 11.5–15.5)
WBC: 6.6 10*3/uL (ref 4.0–10.5)
nRBC: 0 % (ref 0.0–0.2)

## 2018-11-03 LAB — IRON AND TIBC
Iron: 66 ug/dL (ref 28–170)
Saturation Ratios: 23 % (ref 10.4–31.8)
TIBC: 293 ug/dL (ref 250–450)
UIBC: 227 ug/dL

## 2018-11-03 LAB — FERRITIN: Ferritin: 47 ng/mL (ref 11–307)

## 2018-11-03 NOTE — Assessment & Plan Note (Addendum)
1.  Chronic anemia-unclear etiology.  Hemoglobin stable between 10-11.  Asymptomatic.  Saturations 23%; ferritin 47.  Will hold off on Venofer at this time given improvement in bloodwork and that she is asymptomatic. Patient agrees.   # HTN- poorly controlled-discussed regarding compliance with medication checking blood pressure regular basis at home.  Inform PCP of the log.  # DISPOSITION:  # Follow up in 6 months- MD-labs-cbc/cmp/iron studies/ferritin-Dr.B

## 2018-11-03 NOTE — Progress Notes (Signed)
[place] NOTE  Patient Care Team: Einar Pheasant, [name], [name], [name], MD (Otolaryngology)  CHIEF COMPLAINTS/PURPOSE OF CONSULTATION: Anemia  # CHRONIC MILD ANEMIA hb ~11.[Previous Dr.Gittin pt]; [place] 2017- work up [employer]; stool card; Iron/ monoclonal work up- NEGATIVE]   # CHRONIC INTERSTITIAL CYSTITIS/ wheel chair bound   No history exists.     HISTORY OF PRESENTING ILLNESS: pt is a poor historian.  Kimberly Roy 85 y.o.  female  with a history of chronic anemia- is here for a follow up.   Patient denies any blood in urine or blood in stools or black or stools.  Chronic mild to moderate fatigue.  Not new.  Review of Systems  Constitutional: Positive for malaise/fatigue. Negative for chills, diaphoresis and fever.  HENT: Negative for nosebleeds and sore throat.   Eyes: Negative for double vision.  Respiratory: Negative for cough, hemoptysis, sputum production, shortness of breath and wheezing.   Cardiovascular: Negative for chest pain, palpitations, orthopnea and leg swelling.  Gastrointestinal: Negative for abdominal pain, blood in stool, constipation, diarrhea, heartburn, melena, nausea and vomiting.  Genitourinary: Negative for dysuria, frequency and urgency.  Musculoskeletal: Positive for back pain and joint pain.  Skin: Negative.  Negative for itching and rash.  Neurological: Negative for dizziness, tingling, focal weakness, weakness and headaches.  Endo/Heme/Allergies: Does not bruise/bleed easily.  Psychiatric/Behavioral: Negative for depression. The patient is not nervous/anxious and does not have insomnia.      MEDICAL HISTORY:  Past Medical History:  Diagnosis Date  . Anemia   . Anxiety   . Chest pain   . CHF (congestive heart failure) ([place])   . Constipation   . DDD (degenerative disc disease), cervical   .  Depression   . DVT (deep venous thrombosis) ([place])   . Dysphonia   . Dyspnea   . Fatty liver   . Fatty liver   . Headache   . Hyperlipidemia   . Hyperpiesia   . Hypertension   . Hypothyroidism   . Interstitial cystitis   . Left ventricular dysfunction   . Lymphedema   . Nephrolithiasis   . Obstructive sleep apnea   . Osteoarthritis    knees/cervical and lumbar spine  . Pulmonary hypertension ([place])   . Pulmonary nodules    followed by Dr Raul Del  . Pure hypercholesterolemia   . Renal cyst    right    SURGICAL HISTORY: Past Surgical History:  Procedure Laterality Date  . ABDOMINAL HYSTERECTOMY     ovaries left in place  . APPENDECTOMY    . Back Surgeries    . BACK SURGERY    . BREAST REDUCTION SURGERY     3/99  . CARDIAC CATHETERIZATION    . cataracts Bilateral   . CERVICAL SPINE SURGERY    . ESOPHAGEAL MANOMETRY N/A 08/02/2015   Procedure: ESOPHAGEAL MANOMETRY (EM);  Surgeon: Josefine Class, MD;  Location[facility] ENDOSCOPY;  Service: Endoscopy;  Laterality: N/A;  . ESOPHAGOGASTRODUODENOSCOPY N/A 02/27/2015   Procedure: ESOPHAGOGASTRODUODENOSCOPY (EGD);  Surgeon: Hulen Luster, MD;  Location: [facility] ENDOSCOPY;  Service: Gastroenterology;  Laterality: N/A;  . ESOPHAGOGASTRODUODENOSCOPY (EGD) WITH PROPOFOL N/A 10/30/2018   Procedure: ESOPHAGOGASTRODUODENOSCOPY (EGD) WITH PROPOFOL;  Surgeon: Lollie Sails, MD;  Location[facility] ENDOSCOPY;  Service: Endoscopy;  Laterality: N/A;  . EXCISIONAL HEMORRHOIDECTOMY    . EYE SURGERY    . FRACTURE SURGERY    .  HEMORRHOID SURGERY    . HIP SURGERY  2013   Right hip surgery  . JOINT REPLACEMENT    . KNEE ARTHROSCOPY     left and right  . ORIF FEMUR FRACTURE Right 08/07/2018   Procedure: OPEN REDUCTION INTERNAL FIXATION (ORIF) DISTAL FEMUR FRACTURE;  Surgeon: Hessie Knows, MD;  Location: [facility] ORS;  Service: Orthopedics;  Laterality: Right;  . REDUCTION MAMMAPLASTY Bilateral YRS AGO  . REPLACEMENT TOTAL KNEE Bilateral   . rotator  cuff surgery     blilateral  . TONSILECTOMY/ADENOIDECTOMY WITH MYRINGOTOMY      SOCIAL HISTORY: Social History   Socioeconomic History  . Marital status: Married    Spouse name: Not on file  . Number of children: 2  . Years of education: Not on file  . Highest education level: Not on file  Occupational History  . Not on file  Social Needs  . Financial resource strain: Not very hard  . Food insecurity:    Worry: Never true    Inability: Never true  . Transportation needs:    Medical: No    Non-medical: No  Tobacco Use  . Smoking status: Never Smoker  . Smokeless tobacco: Never Used  Substance and Sexual Activity  . Alcohol use: No    Alcohol/week: 0.0 standard drinks  . Drug use: Never  . Sexual activity: Not Currently  Lifestyle  . Physical activity:    Days per week: Not on file    Minutes per session: Not on file  . Stress: Not on file  Relationships  . Social connections:    Talks on phone: Patient refused    Gets together: Patient refused    Attends religious service: Patient refused    Active member of club or organization: Patient refused    Attends meetings of clubs or organizations: Patient refused    Relationship status: Patient refused  . Intimate partner violence:    Fear of current or ex partner: No    Emotionally abused: No    Physically abused: No    Forced sexual activity: No  Other Topics Concern  . Not on file  Social History Narrative  . Not on file    FAMILY HISTORY: Family History  Problem Relation Age of Onset  . Heart disease Mother   . Stroke Mother   . Hypertension Mother   . Heart disease Father        myocardial infarction age 12  . Breast cancer Neg Hx     ALLERGIES:  is allergic to lyrica [pregabalin]; atarax [hydroxyzine]; dicyclomine; hydroxyzine hcl; levaquin [levofloxacin]; nucynta er [tapentadol hcl er]; oxybutynin; zoloft [sertraline hcl]; biaxin [clarithromycin]; sertraline; sulfa antibiotics; sulfasalazine; tape;  and tapentadol.  MEDICATIONS:  Current Outpatient Medications  Medication Sig Dispense Refill  . aluminum-magnesium hydroxide-simethicone (MAALOX) 476-546-50 MG/5ML SUSP Take 15 mLs by mouth 4 (four) times daily -  before meals and at bedtime.     Marland Kitchen amLODipine (NORVASC) 10 MG tablet TAKE 1 TABLET BY MOUTH DAILY 90 tablet 1  . atorvastatin (LIPITOR) 20 MG tablet TAKE ONE TABLET BY MOUTH AT BEDTIME 90 tablet 1  . azelastine (ASTELIN) 0.1 % nasal spray Place 1 spray into both nostrils 2 (two) times daily. Use in each nostril as directed 30 mL 1  . benazepril (LOTENSIN) 20 MG tablet Take 1 tablet (20 mg total) by mouth daily. 20 tablet 0  . busPIRone (BUSPAR) 10 MG tablet Take 10 mg by mouth 2 (two) times daily.     Marland Kitchen  calcium citrate-vitamin D (CITRACAL+D) 315-200 MG-UNIT tablet Take 1 tablet by mouth daily.    [name] Sodium 30-100 MG CAPS Take 1 capsule by mouth daily.     . Cholecalciferol (VITAMIN D3) 1000 UNITS CAPS Take by mouth.    . DULoxetine (CYMBALTA) 60 MG capsule TAKE 1 CAPSULE BY MOUTH EVERY DAY. 30 capsule 3  . gabapentin (NEURONTIN) 300 MG capsule Take 600 mg by mouth 2 (two) times daily.     . hydrochlorothiazide (MICROZIDE) 12.5 MG capsule Take 12.5 mg by mouth daily.    Marland Kitchen HYDROcodone-acetaminophen (NORCO) 10-325 MG tablet Take 1 tablet by mouth 3 (three) times daily. 10 tablet 0  . Inulin (FIBER CHOICE PO) Take 2 tablets by mouth daily. Chew tablets    . levothyroxine (SYNTHROID, LEVOTHROID) 100 MCG tablet Take 1 tablet (100 mcg total) by mouth daily. 90 tablet 3  . magnesium oxide (MAG-OX) 400 MG tablet Take 1 tablet (400 mg total) by mouth daily. 30 tablet 1  . metoprolol succinate (TOPROL-XL) 50 MG 24 hr tablet Take 2 tablets by mouth in the morning and 1 tablet by mouth in the evening. 270 tablet 1  . morphine (MS CONTIN) 60 MG 12 hr tablet Take 1 tablet (60 mg total) by mouth 2 (two) times daily. 10 tablet 0  . Naldemedine Tosylate (SYMPROIC) 0.2 MG TABS  Take 0.2 mg by mouth daily.    Marland Kitchen nystatin cream (MYCOSTATIN) Apply 1 application topically daily as needed for itching.    . Probiotic Product (ALIGN PO) Take by mouth daily.    . RABEprazole (ACIPHEX) 20 MG tablet Take 1 tablet by mouth daily.    Marland Kitchen senna (SENOKOT) 8.6 MG TABS tablet Take 1 tablet by mouth 2 (two) times daily.    . Simethicone (GAS-X PO) Take 2 tablets by mouth 2 (two) times daily.    . sodium chloride (OCEAN) 0.65 % nasal spray Place 1 spray into the nose as needed.    . triamcinolone cream (KENALOG) 0.1 %      No current facility-administered medications for this visit.       Marland Kitchen  PHYSICAL EXAMINATION: ECOG PERFORMANCE STATUS: 0 - Asymptomatic  Vitals:   11/03/18 1400  BP: (!) 203/67  Pulse: (!) 57  Resp: 20  Temp: 99.1 F (37.3 C)   Filed Weights   11/03/18 1400  Weight: 185 lb (83.9 kg)    Physical Exam  Constitutional: She is oriented to person, place, and time and well-developed, well-nourished, and in no distress.  Patient in wheelchair because of chronic arthritic problems.  HENT:  Head: Normocephalic and atraumatic.  Mouth/Throat: Oropharynx is clear and moist. No oropharyngeal exudate.  Eyes: Pupils are equal, round, and reactive to light.  Neck: Normal range of motion. Neck supple.  Cardiovascular: Normal rate and regular rhythm.  Pulmonary/Chest: Effort normal and breath sounds normal. No respiratory distress. She has no wheezes.  Abdominal: Soft. Bowel sounds are normal. She exhibits no distension and no mass. There is no abdominal tenderness. There is no rebound and no guarding.  Musculoskeletal: Normal range of motion.        General: Edema present. No tenderness.  Neurological: She is alert and oriented to person, place, and time.  Skin: Skin is warm.  Psychiatric: Affect normal.    LABORATORY DATA:  I have reviewed the data as listed Lab Results  Component Value Date   WBC 6.6 11/03/2018   HGB 10.6 (L) 11/03/2018   HCT 33.0 (L)  11/03/2018  MCV 98.2 11/03/2018   PLT 156 11/03/2018   Recent Labs    04/27/18 1100  08/08/18 0311 08/09/18 0324 08/10/18 0306 09/10/18 1324 10/15/18 1212  NA 141   < > 139 137 136 139  --   K 4.6   < > 4.0 3.4* 3.6 4.9  --   CL 104   < > 101 100 99 102  --   CO2 31   < > 31 31 34* 31  --   GLUCOSE 100*   < > 122* 112* 109* 83  --   BUN 15   < > _0 --   CREATININE 0.84   < > 0.90 0.99 0.92 0.84  --   CALCIUM 10.0   < > 8.5* 8.0* 8.0* 9.5  --   GFRNONAA  --    < > 58* 52* 57*  --   --   GFRAA  --    < > >60 >60 >60  --   --   PROT 7.1  --   --   --   --  6.5 7.2  ALBUMIN 4.1  --   --   --   --  3.5 4.3  AST 15  --   --   --   --  12 19  ALT 9  --   --   --   --  9 12  ALKPHOS 73  --   --   --   --  192* 163*  BILITOT 0.5  --   --   --   --  0.3 0.5  BILIDIR 0.1  --   --   --   --  0.1 0.1   < > = values in this interval not displayed.    RADIOGRAPHIC STUDIES: I have personally reviewed the radiological images as listed and agreed with the findings in the report. No results found.  ASSESSMENT & PLAN:   Anemia due to blood loss, chronic 1.  Chronic anemia-unclear etiology.  Hemoglobin stable between 10-11.  Asymptomatic.  Saturations 23%; ferritin 47.  Will hold off on Venofer at this time given improvement in bloodwork and that she is asymptomatic. Patient agrees.   # HTN- poorly controlled-discussed regarding compliance with medication checking blood pressure regular basis at home.  Inform PCP of the log.  # DISPOSITION:  # Follow up in 6 months- MD-labs-cbc/cmp/iron studies/ferritin-Dr.B       Cammie Sickle, MD 11/05/2018 8:19 AM

## 2018-11-04 DIAGNOSIS — M21751 Unequal limb length (acquired), right femur: Secondary | ICD-10-CM | POA: Diagnosis not present

## 2018-11-06 ENCOUNTER — Encounter: Payer: Self-pay | Admitting: Family Medicine

## 2018-11-06 ENCOUNTER — Ambulatory Visit (INDEPENDENT_AMBULATORY_CARE_PROVIDER_SITE_OTHER): Payer: PPO | Admitting: Family Medicine

## 2018-11-06 ENCOUNTER — Telehealth: Payer: Self-pay

## 2018-11-06 VITALS — BP 130/70 | HR 97 | Temp 99.1°F

## 2018-11-06 DIAGNOSIS — R0989 Other specified symptoms and signs involving the circulatory and respiratory systems: Secondary | ICD-10-CM

## 2018-11-06 DIAGNOSIS — R05 Cough: Secondary | ICD-10-CM | POA: Diagnosis not present

## 2018-11-06 DIAGNOSIS — R058 Other specified cough: Secondary | ICD-10-CM

## 2018-11-06 DIAGNOSIS — J988 Other specified respiratory disorders: Secondary | ICD-10-CM | POA: Diagnosis not present

## 2018-11-06 MED ORDER — PREDNISONE 10 MG PO TABS: 10.0000 mg | ORAL_TABLET | Freq: Every day | ORAL | 0 refills | Status: AC

## 2018-11-06 MED ORDER — DOXYCYCLINE HYCLATE 100 MG PO TABS: 100.0000 mg | ORAL_TABLET | Freq: Two times a day (BID) | ORAL | 0 refills | Status: DC

## 2018-11-06 MED ORDER — ALBUTEROL SULFATE HFA 108 (90 BASE) MCG/ACT IN AERS: 2.0000 | INHALATION_SPRAY | Freq: Four times a day (QID) | RESPIRATORY_TRACT | 0 refills | Status: DC | PRN

## 2018-11-06 NOTE — Progress Notes (Signed)
Subjective:    Patient ID: Kimberly Roy, female    DOB: 08/04/1931, 85 y.o.   MRN: [id number]  HPI   Patient comes in clinic complaining of cough, chest congestion, bringing up yellow sputum for almost 1 week.  She has been taking over-the-counter Mucinex without much effect in helping symptoms to improve. Feels like phlegm getting caught in chest.   Denies fever or chills.  Denies nausea vomiting or diarrhea.  Denies shortness of breath.  Patient does have albuterol inhaler to use if needed, but has not felt she needed it.  Patient Active Problem List   Diagnosis Date Noted  . Femur fracture ([place]) 08/06/2018  . Superior mesenteric artery stenosis ([place]) 07/15/2018  . Right shoulder pain 05/16/2017  . Chronic venous insufficiency 02/10/2017  . Lymphedema 02/10/2017  . Neuropathy 12/25/2016  . Anemia due to blood loss, chronic 08/07/2016  . GERD (gastroesophageal reflux disease) 12/10/2015  . Finger pain 11/13/2015  . Carotid artery calcification 09/26/2015  . External nasal lesion 09/25/2015  . Muscle cramps 09/01/2015  . Excessive sweating 07/30/2015  . Headache 07/16/2015  . Groin pain 05/14/2015  . Muscle twitching 03/05/2015  . Leg cramps 02/11/2015  . Back pain 02/11/2015  . Abdominal pain 02/11/2015  . Acute cystitis without hematuria 12/12/2014  . Left elbow pain 11/30/2014  . Health care maintenance 11/30/2014  . Osteoporosis 10/19/2014  . Rectal bleeding 10/19/2014  . Neck pain 09/03/2014  . Unsteady gait 09/03/2014  . Nocturia 09/03/2014  . Dysphagia 06/01/2014  . Nasal dryness 06/01/2014  . Stress 06/01/2014  . Fatigue 02/27/2014  . Pre-op evaluation 10/12/2013  . Hoarseness 08/14/2013  . Leg swelling 01/24/2013  . CHF (congestive heart failure) ([place]) 12/29/2012  . Cough 12/29/2012  . OSA (obstructive sleep apnea) 07/09/2012  . Osteoarthritis 07/04/2012  . Anemia 07/04/2012  . Chronic constipation 07/04/2012  . Pulmonary hypertension ([place]) 07/04/2012  .  Pulmonary nodules 07/04/2012  . Hypertension 07/04/2012  . Hyperlipidemia 07/04/2012  . Hypothyroidism 07/04/2012   Social History   Tobacco Use  . Smoking status: Never Smoker  . Smokeless tobacco: Never Used  Substance Use Topics  . Alcohol use: No    Alcohol/week: 0.0 standard drinks    Review of Systems  Constitutional: Negative for chills, fatigue and fever.  HENT: Negative for congestion, ear pain, sinus pain and sore throat.   Eyes: Negative.   Respiratory: +cough, chest congestion, yellow phlegm, shortness of breath and wheezing.   Cardiovascular: Negative for chest pain, palpitations and leg swelling.  Gastrointestinal: Negative for abdominal pain, diarrhea, nausea and vomiting.  Genitourinary: Negative for dysuria, frequency and urgency.  Musculoskeletal: Negative for arthralgias and myalgias.  Skin: Negative for color change, pallor and rash.  Neurological: Negative for syncope, light-headedness and headaches.  Psychiatric/Behavioral: The patient is not nervous/anxious.       Objective:   Physical Exam Vitals signs and nursing note reviewed.  Constitutional:      General: She is not in acute distress.    Appearance: She is obese. She is not toxic-appearing.  HENT:     Head: Normocephalic.     Right Ear: Tympanic membrane, ear canal and external ear normal.     Left Ear: Tympanic membrane, ear canal and external ear normal.     Nose: Congestion and rhinorrhea present.     Mouth/Throat:     Mouth: Mucous membranes are moist.     Pharynx: No oropharyngeal exudate or posterior oropharyngeal erythema.  Eyes:  General: No scleral icterus.    Extraocular Movements: Extraocular movements intact.     Conjunctiva/sclera: Conjunctivae normal.  Neck:     Musculoskeletal: Neck supple. No neck rigidity.  Cardiovascular:     Rate and Rhythm: Normal rate and regular rhythm.  Pulmonary:     Effort: Pulmonary effort is normal. No respiratory distress.     Breath  sounds: Wheezing and rhonchi present. No rales.     Comments: +wet cough Lymphadenopathy:     Cervical: No cervical adenopathy.  Skin:    General: Skin is warm and dry.     Coloration: Skin is not jaundiced or pale.  Neurological:     Mental Status: She is alert and oriented to person, place, and time.  Psychiatric:        Mood and Affect: Mood normal.        Behavior: Behavior normal.     Vitals:   11/06/18 1101  BP: 130/70  Pulse: 97  Temp: 99.1 F (37.3 C)  SpO2: 97%      Assessment & Plan:   Respiratory infection, chest congestion, cough with yellow sputum - due to patient's symptoms, and rhonchorous lung sounds we will treat with doxycycline twice daily for 10 days.  She will also take oral steroid to help reduce inflammation.  She has albuterol inhaler to use if needed for any shortness of breath or wheezing.  Advised to continue using Mucinex, this is a great over-the-counter choice to help reduce cough and chest congestion symptoms.  Advised to keep regular scheduled follow-up PCP as planned.  Offered chest x-ray in clinic, but patient and family members declined.  Strict return precautions given, advised if breathing or symptoms worsen in any way to call office right away for reevaluation.

## 2018-11-06 NOTE — Telephone Encounter (Signed)
Ok to double book them this time  In future, she needs to call ahead and get her own appt time so we are not always getting double booked.

## 2018-11-06 NOTE — Telephone Encounter (Signed)
Copied from [employer] [id number]. Topic: General - Inquiry >> Nov 06, 2018  8:55 AM Ahmed Prima L wrote: Reason for CRM: patient states her husband is coming in at 10:40 this morning to see Lauren and now she is having symptoms of sore throat, chest congestion// no international travel. She would like to know could she be worked in around that same time. She is advised there are no open appointments.

## 2018-11-06 NOTE — Telephone Encounter (Signed)
I called and spoke with the pt and scheduledher appt with her husbands per L. Guse.  Gae Bon, CMA

## 2018-11-10 ENCOUNTER — Encounter (INDEPENDENT_AMBULATORY_CARE_PROVIDER_SITE_OTHER): Payer: Self-pay | Admitting: Vascular Surgery

## 2018-11-10 ENCOUNTER — Ambulatory Visit (INDEPENDENT_AMBULATORY_CARE_PROVIDER_SITE_OTHER): Payer: PPO | Admitting: Vascular Surgery

## 2018-11-10 ENCOUNTER — Other Ambulatory Visit: Payer: Self-pay

## 2018-11-10 VITALS — BP 187/68 | HR 69 | Resp 12 | Ht 62.0 in | Wt 185.0 lb

## 2018-11-10 DIAGNOSIS — Z79899 Other long term (current) drug therapy: Secondary | ICD-10-CM | POA: Diagnosis not present

## 2018-11-10 DIAGNOSIS — K551 Chronic vascular disorders of intestine: Secondary | ICD-10-CM

## 2018-11-10 DIAGNOSIS — I10 Essential (primary) hypertension: Secondary | ICD-10-CM

## 2018-11-10 DIAGNOSIS — E785 Hyperlipidemia, unspecified: Secondary | ICD-10-CM

## 2018-11-10 DIAGNOSIS — F5105 Insomnia due to other mental disorder: Secondary | ICD-10-CM | POA: Diagnosis not present

## 2018-11-10 DIAGNOSIS — I509 Heart failure, unspecified: Secondary | ICD-10-CM | POA: Diagnosis not present

## 2018-11-10 DIAGNOSIS — F419 Anxiety disorder, unspecified: Secondary | ICD-10-CM | POA: Diagnosis not present

## 2018-11-10 DIAGNOSIS — I890 Lymphedema, not elsewhere classified: Secondary | ICD-10-CM

## 2018-11-10 DIAGNOSIS — I771 Stricture of artery: Secondary | ICD-10-CM | POA: Diagnosis not present

## 2018-11-10 DIAGNOSIS — F330 Major depressive disorder, recurrent, mild: Secondary | ICD-10-CM | POA: Diagnosis not present

## 2018-11-10 NOTE — Assessment & Plan Note (Signed)
The patient has gastric erosions and mucosal abnormalities of her duodenum with a known history of SMA stenosis.  Her gastroenterologist has recommended intervention.  We discussed intervention some today, but the patient was not committed to having the procedure.  The patient had another appointment this afternoon and left before deciding on whether or not to have the procedure.  She said she would reschedule if she would like to discuss things further.

## 2018-11-10 NOTE — Progress Notes (Signed)
MRN : [id number]  Kimberly Roy is a 85 y.o. (30-Dec-1930) female who presents with chief complaint of  Chief Complaint  Patient presents with  . Follow-up  .  History of Present Illness: Patient returns today in follow up of his enteric artery disease.  She underwent an endoscopy last month which demonstrated gastritis and mucosal abnormalities.  She has a known history of SMA stenosis on previous duplex studies that we have seen in the past.  She still has abdominal pain and this is frequently postprandial in nature.  She reports no unintentional weight loss.  She does have some food fear.  Her gastroenterologist was very concerned with her findings on endoscopy and her known history of SMA stenosis so referred her back to recommend intervention to her SMA.  Current Outpatient Medications  Medication Sig Dispense Refill  . albuterol (PROVENTIL HFA;VENTOLIN HFA) 108 (90 Base) MCG/ACT inhaler Inhale 2 puffs into the lungs every 6 (six) hours as needed for wheezing or shortness of breath. 1 Inhaler 0  . aluminum-magnesium hydroxide-simethicone (MAALOX) 053-976-73 MG/5ML SUSP Take 15 mLs by mouth 4 (four) times daily -  before meals and at bedtime.     Marland Kitchen amLODipine (NORVASC) 10 MG tablet TAKE 1 TABLET BY MOUTH DAILY 90 tablet 1  . atorvastatin (LIPITOR) 20 MG tablet TAKE ONE TABLET BY MOUTH AT BEDTIME 90 tablet 1  . azelastine (ASTELIN) 0.1 % nasal spray Place 1 spray into both nostrils 2 (two) times daily. Use in each nostril as directed 30 mL 1  . benazepril (LOTENSIN) 20 MG tablet Take 1 tablet (20 mg total) by mouth daily. 20 tablet 0  . busPIRone (BUSPAR) 10 MG tablet Take 10 mg by mouth 2 (two) times daily.     . calcium citrate-vitamin D (CITRACAL+D) 315-200 MG-UNIT tablet Take 1 tablet by mouth daily.    [name] Sodium 30-100 MG CAPS Take 1 capsule by mouth daily.     . Cholecalciferol (VITAMIN D3) 1000 UNITS CAPS Take by mouth.    . doxycycline (VIBRA-TABS) 100 MG tablet  Take 1 tablet (100 mg total) by mouth 2 (two) times daily. 20 tablet 0  . DULoxetine (CYMBALTA) 60 MG capsule TAKE 1 CAPSULE BY MOUTH EVERY DAY. 30 capsule 3  . gabapentin (NEURONTIN) 300 MG capsule Take 600 mg by mouth 2 (two) times daily.     Marland Kitchen HYDROcodone-acetaminophen (NORCO) 10-325 MG tablet Take 1 tablet by mouth 3 (three) times daily. 10 tablet 0  . levothyroxine (SYNTHROID, LEVOTHROID) 100 MCG tablet Take 1 tablet (100 mcg total) by mouth daily. 90 tablet 3  . magnesium oxide (MAG-OX) 400 MG tablet Take 1 tablet (400 mg total) by mouth daily. 30 tablet 1  . metoprolol succinate (TOPROL-XL) 50 MG 24 hr tablet Take 2 tablets by mouth in the morning and 1 tablet by mouth in the evening. 270 tablet 1  . morphine (MS CONTIN) 60 MG 12 hr tablet Take 1 tablet (60 mg total) by mouth 2 (two) times daily. 10 tablet 0  . Naldemedine Tosylate (SYMPROIC) 0.2 MG TABS Take 0.2 mg by mouth daily.    Marland Kitchen nystatin cream (MYCOSTATIN) Apply 1 application topically daily as needed for itching.    . predniSONE (DELTASONE) 10 MG tablet Take 1 tablet (10 mg total) by mouth daily with breakfast for 5 days. 5 tablet 0  . RABEprazole (ACIPHEX) 20 MG tablet Take 1 tablet by mouth daily.    Marland Kitchen senna (SENOKOT) 8.6 MG TABS tablet  Take 1 tablet by mouth 2 (two) times daily.    . Simethicone (GAS-X PO) Take 2 tablets by mouth 2 (two) times daily.    . sodium chloride (OCEAN) 0.65 % nasal spray Place 1 spray into the nose as needed.    . triamcinolone cream (KENALOG) 0.1 %     . hydrochlorothiazide (MICROZIDE) 12.5 MG capsule Take 12.5 mg by mouth daily.    . Inulin (FIBER CHOICE PO) Take 2 tablets by mouth daily. Chew tablets    . Probiotic Product (ALIGN PO) Take by mouth daily.     No current facility-administered medications for this visit.     Past Medical History:  Diagnosis Date  . Anemia   . Anxiety   . Chest pain   . CHF (congestive heart failure) ([place])   . Constipation   . DDD (degenerative disc disease),  cervical   . Depression   . DVT (deep venous thrombosis) ([place])   . Dysphonia   . Dyspnea   . Fatty liver   . Fatty liver   . Headache   . Hyperlipidemia   . Hyperpiesia   . Hypertension   . Hypothyroidism   . Interstitial cystitis   . Left ventricular dysfunction   . Lymphedema   . Nephrolithiasis   . Obstructive sleep apnea   . Osteoarthritis    knees/cervical and lumbar spine  . Pulmonary hypertension ([place])   . Pulmonary nodules    followed by Dr Raul Del  . Pure hypercholesterolemia   . Renal cyst    right    Past Surgical History:  Procedure Laterality Date  . ABDOMINAL HYSTERECTOMY     ovaries left in place  . APPENDECTOMY    . Back Surgeries    . BACK SURGERY    . BREAST REDUCTION SURGERY     3/99  . CARDIAC CATHETERIZATION    . cataracts Bilateral   . CERVICAL SPINE SURGERY    . ESOPHAGEAL MANOMETRY N/A 08/02/2015   Procedure: ESOPHAGEAL MANOMETRY (EM);  Surgeon: Josefine Class, MD;  Location: [facility] ENDOSCOPY;  Service: Endoscopy;  Laterality: N/A;  . ESOPHAGOGASTRODUODENOSCOPY N/A 02/27/2015   Procedure: ESOPHAGOGASTRODUODENOSCOPY (EGD);  Surgeon: Hulen Luster, MD;  Location: [facility] ENDOSCOPY;  Service: Gastroenterology;  Laterality: N/A;  . ESOPHAGOGASTRODUODENOSCOPY (EGD) WITH PROPOFOL N/A 10/30/2018   Procedure: ESOPHAGOGASTRODUODENOSCOPY (EGD) WITH PROPOFOL;  Surgeon: Lollie Sails, MD;  Location: [facility] ENDOSCOPY;  Service: Endoscopy;  Laterality: N/A;  . EXCISIONAL HEMORRHOIDECTOMY    . EYE SURGERY    . FRACTURE SURGERY    . HEMORRHOID SURGERY    . HIP SURGERY  2013   Right hip surgery  . JOINT REPLACEMENT    . KNEE ARTHROSCOPY     left and right  . ORIF FEMUR FRACTURE Right 08/07/2018   Procedure: OPEN REDUCTION INTERNAL FIXATION (ORIF) DISTAL FEMUR FRACTURE;  Surgeon: Hessie Knows, MD;  Location: [facility] ORS;  Service: Orthopedics;  Laterality: Right;  . REDUCTION MAMMAPLASTY Bilateral YRS AGO  . REPLACEMENT TOTAL KNEE Bilateral   . rotator cuff  surgery     blilateral  . TONSILECTOMY/ADENOIDECTOMY WITH MYRINGOTOMY      Social History        Tobacco Use  . Smoking status: Never Smoker  . Smokeless tobacco: Never Used  Substance Use Topics  . Alcohol use: No    Alcohol/week: 0.0 standard drinks  . Drug use: No    Family History      Family History  Problem Relation Age of  Onset  . Heart disease Mother   . Stroke Mother   . Hypertension Mother   . Heart disease Father        myocardial infarction age 21  . Breast cancer Neg Hx           Allergies  Allergen Reactions  . Lyrica [Pregabalin] Swelling  . Atarax [Hydroxyzine]     jittery  . Dicyclomine Other (See Comments)    Abdominal bloating   . Hydroxyzine Hcl     jittery  . Levaquin [Levofloxacin] Swelling  . Nucynta Er [Tapentadol Hcl Er] Other (See Comments)    Severe constipation   . Oxybutynin Other (See Comments)    Blurred vision  . Zoloft [Sertraline Hcl]     Severe headache  . Biaxin [Clarithromycin] Other (See Comments) and Rash    Pt does not remember Pt does not remember  . Sertraline Nausea And Vomiting    Severe headache Severe headache Other reaction(s): Headache Severe headache  . Sulfa Antibiotics Rash    Pt does not remember  . Sulfasalazine Rash    Pt does not remember  . Tape Rash    [place] - redness  . Tapentadol Other (See Comments) and Rash    _0      REVIEW OF SYSTEMS (Negative unless checked)  Constitutional: _1 ?Weight loss  _2 ?Fever  _3 ?Chills Cardiac: _4 ?Chest pain   _5 ?Chest pressure   _6 ?Palpitations   _7 ?Shortness of breath when laying flat   _8 ?Shortness of breath at rest   _9 ?Shortness of breath with exertion. Vascular:  _10 ?Pain in legs with walking   _11 ?Pain in legs at rest   _12 ?Pain in legs when laying flat   _13 ?Claudication   _14 ?Pain in feet when walking   _15 ?Pain in feet at rest  _16 ?Pain in feet when laying flat   _17 ?History of DVT   _18 ?Phlebitis   _19 ?Swelling in legs   _20 ?Varicose veins   _21 ?Non-healing ulcers Pulmonary:   _22 ?Uses home oxygen   _23 ?Productive cough   _24 ?Hemoptysis   _25 ?Wheeze  _26 ?COPD   _27 ?Asthma Neurologic:  _28 ?Dizziness  _29 ?Blackouts   _30 ?Seizures   _31 ?History of stroke   _32 ?History of TIA  _33 ?Aphasia   _34 ?Temporary blindness   _35 ?Dysphagia   _36 ?Weakness or numbness in arms   _37 ?Weakness or numbness in legs Musculoskeletal:  _38 ?Arthritis   _39 ?Joint swelling   _40 ?Joint pain   _41 ?Low back pain Hematologic:  _42 ?Easy bruising  _43 ?Easy bleeding   _44 ?Hypercoagulable state   _45 ?Anemic   Gastrointestinal:  _46 ?Blood in stool   _47 ?Vomiting blood  _48 ?Gastroesophageal reflux/heartburn   _49 ?Abdominal pain Genitourinary:  _50 ?Chronic kidney disease   _51 ?Difficult urination  _52 ?Frequent urination  _53 ?Burning with urination   _54 ?Hematuria Skin:  _55 ?Rashes   _56 ?Ulcers   _57 ?Wounds Psychological:  _58 ?History of anxiety   _59 ? History of major depression.    Physical Examination  BP (!) 187/68 (BP Location: Left Arm, Patient Position: Sitting, Cuff Size: Large)   Pulse 69   Resp 12   Ht _60  (1.575 m)   Wt 185 lb (83.9 kg)   BMI 33.84 kg/m  Gen:  WD/WN, NAD Head: [place]/AT, No temporalis wasting. Ear/Nose/Throat: Hearing grossly intact, nares w/o erythema or drainage Eyes: Conjunctiva clear. Sclera non-icteric Neck: Supple.  Trachea midline Pulmonary:  Good air movement, no use of accessory muscles.  Cardiac: RRR, no JVD Vascular:  Vessel Right Left  Radial Palpable Palpable  Musculoskeletal: M/S 5/5 throughout.  No deformity or atrophy.  In a wheelchair. Neurologic: Sensation grossly intact in extremities.  Symmetrical.  Speech is fluent.  Psychiatric: Judgment intact, Mood & affect appropriate for pt's clinical situation. Dermatologic: No rashes or ulcers noted.  No cellulitis or open  wounds.       Labs Recent Results (from the past 2160 hour(s))  TSH     Status: None   Collection Time: 09/10/18  1:24 PM  Result Value Ref Range   TSH 2.87 0.35 - 4.50 uIU/mL  Lipid panel     Status: None   Collection Time: 09/10/18  1:24 PM  Result Value Ref Range   Cholesterol 117 0 - 200 mg/dL    Comment: ATP III Classification       Desirable:  < 200 mg/dL               Borderline High:  200 - 239 mg/dL          High:  > = 240 mg/dL   Triglycerides 91.0 0.0 - 149.0 mg/dL    Comment: Normal:  <150 mg/dLBorderline High:  150 - 199 mg/dL   HDL 41.40 >39.00 mg/dL   VLDL 18.2 0.0 - 40.0 mg/dL   LDL Cholesterol 57 0 - 99 mg/dL   Total CHOL/HDL Ratio 3     Comment:                Men          Women1/2 Average Risk     3.4          3.3Average Risk          5.0          4.42X Average Risk          9.6          7.13X Average Risk          15.0          11.0                       NonHDL 75.25     Comment: NOTE:  Non-HDL goal should be 30 mg/dL higher than patient's LDL goal (i.e. LDL goal of < 70 mg/dL, would have non-HDL goal of < 100 mg/dL)  Hepatic function panel     Status: Abnormal   Collection Time: 09/10/18  1:24 PM  Result Value Ref Range   Total Bilirubin 0.3 0.2 - 1.2 mg/dL   Bilirubin, Direct 0.1 0.0 - 0.3 mg/dL   Alkaline Phosphatase 192 (H) 39 - 117 U/L   AST 12 0 - 37 U/L   ALT 9 0 - 35 U/L   Total Protein 6.5 6.0 - 8.3 g/dL   Albumin 3.5 3.5 - 5.2 g/dL  Basic metabolic panel     Status: None   Collection Time: 09/10/18  1:24 PM  Result Value Ref Range   Sodium 139 135 - 145 mEq/L   Potassium 4.9 3.5 - 5.1 mEq/L   Chloride 102 96 - 112 mEq/L   CO2 31 19 - 32 mEq/L   Glucose, Bld 83 70 - 99 mg/dL   BUN 18 6 - 23 mg/dL   Creatinine, Ser 0.84 0.40 - 1.20 mg/dL   Calcium 9.5 8.4 - 10.5 mg/dL   GFR 68.40 >60.00 mL/min  CBC with Differential/Platelet     Status: Abnormal   Collection Time: 10/15/18 12:12 PM  Result Value Ref Range  WBC 11.3 (H) 4.0 - 10.5 K/uL    RBC 3.63 (L) 3.87 - 5.11 Mil/uL   Hemoglobin 11.4 (L) 12.0 - 15.0 g/dL   HCT 34.8 (L) 36.0 - 46.0 %   MCV 95.9 78.0 - 100.0 fl   MCHC 32.8 30.0 - 36.0 g/dL   RDW 15.4 11.5 - 15.5 %   Platelets 202.0 150.0 - 400.0 K/uL   Neutrophils Relative % 67.8 43.0 - 77.0 %   Lymphocytes Relative 21.9 12.0 - 46.0 %   Monocytes Relative 9.5 3.0 - 12.0 %   Eosinophils Relative 0.4 0.0 - 5.0 %   Basophils Relative 0.4 0.0 - 3.0 %   Neutro Abs 7.7 1.4 - 7.7 K/uL   Lymphs Abs 2.5 0.7 - 4.0 K/uL   Monocytes Absolute 1.1 (H) 0.1 - 1.0 K/uL   Eosinophils Absolute 0.1 0.0 - 0.7 K/uL   Basophils Absolute 0.0 0.0 - 0.1 K/uL  Ferritin     Status: None   Collection Time: 10/15/18 12:12 PM  Result Value Ref Range   Ferritin 51.4 10.0 - 291.0 ng/mL  Hepatic function panel     Status: Abnormal   Collection Time: 10/15/18 12:12 PM  Result Value Ref Range   Total Bilirubin 0.5 0.2 - 1.2 mg/dL   Bilirubin, Direct 0.1 0.0 - 0.3 mg/dL   Alkaline Phosphatase 163 (H) 39 - 117 U/L   AST 19 0 - 37 U/L   ALT 12 0 - 35 U/L   Total Protein 7.2 6.0 - 8.3 g/dL   Albumin 4.3 3.5 - 5.2 g/dL  IBC panel     Status: None   Collection Time: 10/15/18 12:12 PM  Result Value Ref Range   Iron 93 42 - 145 ug/dL   Transferrin 243.0 212.0 - 360.0 mg/dL   Saturation Ratios 27.3 20.0 - 50.0 %  Surgical pathology     Status: None   Collection Time: 10/30/18 10:28 AM  Result Value Ref Range   SURGICAL PATHOLOGY      Surgical Pathology CASE: [id number] PATIENT: Adriella Bothwell Surgical Pathology Report     SPECIMEN SUBMITTED: A. Duodenum; cbx B. Duodenum bulb; cbx C. Stomach, antrum; cbx D. Stomach, body; cbx E. GEJ; cbx  CLINICAL HISTORY: None provided  PRE-OPERATIVE DIAGNOSIS: Abdominal pain  POST-OPERATIVE DIAGNOSIS: Candida, atrophic gastritis, atonic pylorus, esophageal dysmotility     DIAGNOSIS: A.  DUODENUM; COLD BIOPSY: - DUODENAL MUCOSA WITH INTACT VILLI. - NEGATIVE FOR ACTIVE INFLAMMATION,  INTRAEPITHELIAL LYMPHOCYTOSIS, AND INFECTIOUS AGENTS.  B.  DUODENAL BULB; COLD BIOPSY: - DUODENAL MUCOSA WITH INTACT VILLI AND NONSPECIFIC BRUNNER GLAND HYPERPLASIA. - NEGATIVE FOR ACTIVE INFLAMMATION, INTRAEPITHELIAL LYMPHOCYTOSIS, AND INFECTIOUS AGENTS.  C.  STOMACH, ANTRUM; COLD BIOPSY: - MILD REACTIVE GASTROPATHY. - NEGATIVE FOR ACTIVE INFLAMMATION, H. PYLORI, INTESTINAL METAPLASIA, DYSPLASIA, AND MALIGNANCY.  D.  STOMACH, BODY; COLD BIOPSY: - PROTON PUMP IN HIBITOR EFFECT. - NEGATIVE FOR H. PYLORI, INTESTINAL METAPLASIA, DYSPLASIA, AND MALIGNANCY.  E.  GASTROESOPHAGEAL JUNCTION; COLD BIOPSY: - SQUAMOCOLUMNAR MUCOSA WITH MILD CHRONIC ACTIVE INFLAMMATION AND REACTIVE EPITHELIAL CHANGES CONSISTENT WITH REFLUX ESOPHAGITIS. - GASTRIC OXYNTIC-TYPE MUCOSA WITH MILD CHRONIC INFLAMMATION. - NEGATIVE FOR GOBLET CELLS, DYSPLASIA, AND MALIGNANCY.  GROSS DESCRIPTION: A. Labeled: C BX duodenum Received: Formalin Tissue fragment(s): One Size: 0.3 cm Description: Tan soft tissue fragment Entirely submitted in A1.  B. Labeled: C BX duodenal bulb Received: Formalin Tissue fragment(s): One Size: 0.3 cm Description: Tan soft tissue fragments Entirely submitted in B1.  C. Labeled: C BX antrum Received: Formalin Tissue fragment(s): Two  Size: 0.1 and a 0.2 cm Description: Tan soft tissue fragments Entirely submitted in C1.  D. Labeled: C BX gastric body Received: Formalin Tissue fragment(s): Two Size: Each, 0.3 cm Description : Tan soft tissue fragments Entirely submitted in D1.  E. Labeled: C BX GE junction Received: Formalin Tissue fragment(s): Three Size: Aggregate, 0.3 x 0.2 x 0.1 cm Description: Tan soft tissue fragments Entirely submitted in E1.   Final Diagnosis performed by Bryan Lemma, MD.   Electronically signed 11/02/2018 5:40:04PM The electronic signature indicates that the named Attending Pathologist has evaluated the specimen  Technical component performed  at [facility], [address], [place], [place] [address] Lab: [phone] Dir: Rush Farmer, MD, MMM  Professional component performed at [facility], [facility], [place], [place], [place] [address] Lab: [phone] Dir: Dellia Nims. Rubinas, MD   KOH prep     Status: None   Collection Time: 10/30/18 10:38 AM  Result Value Ref Range   Specimen Description BRONCHIAL BRUSHING    Special Requests NONE    KOH Prep      NO YEAST OR FUNGAL ELEMENTS SEEN Performed at [facility], [address] [place], [place] [address]    Report Status 10/30/2018 FINAL   Iron and TIBC     Status: None   Collection Time: 11/03/18  1:19 PM  Result Value Ref Range   Iron 66 28 - 170 ug/dL   TIBC 293 250 - 450 ug/dL   Saturation Ratios 23 10.4 - 31.8 %   UIBC 227 ug/dL    Comment: Performed at [facility], [address] [place], [place] [address]  Ferritin     Status: None   Collection Time: 11/03/18  1:19 PM  Result Value Ref Range   Ferritin 47 11 - 307 ng/mL    Comment: Performed at [facility], [address] [place], [place] [address]  CBC with Differential     Status: Abnormal   Collection Time: 11/03/18  1:19 PM  Result Value Ref Range   WBC 6.6 4.0 - 10.5 K/uL   RBC 3.36 (L) 3.87 - 5.11 MIL/uL   Hemoglobin 10.6 (L) 12.0 - 15.0 g/dL   HCT 33.0 (L) 36.0 - 46.0 %   MCV 98.2 80.0 - 100.0 fL   MCH 31.5 26.0 - 34.0 pg   MCHC 32.1 30.0 - 36.0 g/dL   RDW 14.4 11.5 - 15.5 %   Platelets 156 150 - 400 K/uL   nRBC 0.0 0.0 - 0.2 %   Neutrophils Relative % 60 %   Neutro Abs 4.0 1.7 - 7.7 K/uL   Lymphocytes Relative 30 %   Lymphs Abs 2.0 0.7 - 4.0 K/uL   Monocytes Relative 7 %   Monocytes Absolute 0.5 0.1 - 1.0 K/uL   Eosinophils Relative 2 %   Eosinophils Absolute 0.1 0.0 - 0.5 K/uL   Basophils Relative 1 %   Basophils Absolute 0.0 0.0 - 0.1 K/uL   Immature Granulocytes 0 %   Abs Immature Granulocytes 0.01 0.00 - 0.07 K/uL    Comment:  Performed at [facility], [address], [place], [place] [address]    Radiology No results found.  Assessment/Plan CHF (congestive heart failure) Can contribute to lower extremity swelling.  Hypercholesterolemia lipid control important in reducing the progression of atherosclerotic disease. Continue statin therapy   Hypertension blood pressure control important in reducing the progression of atherosclerotic disease. On appropriate oral medications.   Lymphedema Compression and elevation have  done a great job of controlling her swelling. This remains reasonably mild. At this point, she can use the lymphedema pump as needed.   Superior mesenteric artery stenosis (HCC) The patient has gastric erosions and mucosal abnormalities of her duodenum with a known history of SMA stenosis.  Her gastroenterologist has recommended intervention.  We discussed intervention some today, but the patient was not committed to having the procedure.  The patient had another appointment this afternoon and left before deciding on whether or not to have the procedure.  She said she would reschedule if she would like to discuss things further.    Leotis Pain, MD  11/10/2018 3:28 PM    This note was created with Dragon medical transcription system.  Any errors from dictation are purely unintentional

## 2018-11-11 ENCOUNTER — Other Ambulatory Visit: Payer: Self-pay

## 2018-11-11 ENCOUNTER — Ambulatory Visit (INDEPENDENT_AMBULATORY_CARE_PROVIDER_SITE_OTHER): Payer: PPO

## 2018-11-11 VITALS — BP 136/72 | HR 58 | Temp 98.3°F | Resp 16 | Ht 62.0 in | Wt 185.0 lb

## 2018-11-11 DIAGNOSIS — Z0000 Encounter for general adult medical examination without abnormal findings: Secondary | ICD-10-CM

## 2018-11-11 NOTE — Progress Notes (Addendum)
Subjective:   Kimberly Roy is a 85 y.o. female who presents for Medicare Annual (Subsequent) preventive examination.  Review of Systems:  No ROS.  Medicare Wellness Visit. Additional risk factors are reflected in the social history. Cardiac Risk Factors include: advanced age (>85mn, >>32women);hypertension     Objective:     Vitals: BP 136/72 (Cuff Size: Large)   Pulse (!) 58   Temp 98.3 F (36.8 C) (Oral)   SpO2 95%   There is no height or weight on file to calculate BMI.  Advanced Directives 11/11/2018 11/03/2018 10/30/2018 08/28/2018 08/07/2018 08/06/2018 12/22/2017  Does Patient Have a Medical Advance Directive? _0  No Yes  Type of Advance Directive Living will;Healthcare Power of [place]  Does patient want to make changes to medical advance directive? - No - Patient declined - No - Patient declined No - Patient declined - -  Copy of [place] Chart? No - copy requested No - copy requested - - - - -  Would patient like information on creating a medical advance directive? - No - Patient declined - - No - Patient declined - -    Tobacco Social History   Tobacco Use  Smoking Status Never Smoker  Smokeless Tobacco Never Used     Counseling given: Not Answered   Clinical Intake:  Pre-visit preparation completed: Yes        Diabetes: No  How often do you need to have someone help you when you read instructions, pamphlets, or other written materials from your doctor or pharmacy?: 1 - Never  Interpreter Needed?: No     Past Medical History:  Diagnosis Date  . Anemia   . Anxiety   . Chest pain   . CHF (congestive heart failure) ([place]   . Constipation   . DDD (degenerative disc disease), cervical   . Depression   . DVT (deep venous thrombosis) ([place]   .  Dysphonia   . Dyspnea   . Fatty liver   . Fatty liver   . Headache   . Hyperlipidemia   . Hyperpiesia   . Hypertension   . Hypothyroidism   . Interstitial cystitis   . Left ventricular dysfunction   . Lymphedema   . Nephrolithiasis   . Obstructive sleep apnea   . Osteoarthritis    knees/cervical and lumbar spine  . Pulmonary hypertension ([place]   . Pulmonary nodules    followed by Dr FRaul Del . Pure hypercholesterolemia   . Renal cyst    right   Past Surgical History:  Procedure Laterality Date  . ABDOMINAL HYSTERECTOMY     ovaries left in place  . APPENDECTOMY    . Back Surgeries    . BACK SURGERY    . BREAST REDUCTION SURGERY     3/99  . CARDIAC CATHETERIZATION    . cataracts Bilateral   . CERVICAL SPINE SURGERY    . ESOPHAGEAL MANOMETRY N/A 08/02/2015   Procedure: ESOPHAGEAL MANOMETRY (EM);  Surgeon: MJosefine Class MD;  [facility];  Service: Endoscopy;  Laterality: N/A;  . ESOPHAGOGASTRODUODENOSCOPY N/A 02/27/2015   Procedure: ESOPHAGOGASTRODUODENOSCOPY (EGD);  Surgeon: PHulen Luster MD;  [facility];  Service: Gastroenterology;  Laterality: N/A;  . ESOPHAGOGASTRODUODENOSCOPY (EGD) WITH PROPOFOL N/A 10/30/2018   Procedure: ESOPHAGOGASTRODUODENOSCOPY (EGD) WITH PROPOFOL;  Surgeon: SLoistine Simas  U, MD;  Location: [facility] ENDOSCOPY;  Service: Endoscopy;  Laterality: N/A;  . EXCISIONAL HEMORRHOIDECTOMY    . EYE SURGERY    . FRACTURE SURGERY    . HEMORRHOID SURGERY    . HIP SURGERY  2013   Right hip surgery  . JOINT REPLACEMENT    . KNEE ARTHROSCOPY     left and right  . ORIF FEMUR FRACTURE Right 08/07/2018   Procedure: OPEN REDUCTION INTERNAL FIXATION (ORIF) DISTAL FEMUR FRACTURE;  Surgeon: Hessie Knows, MD;  Location: [facility] ORS;  Service: Orthopedics;  Laterality: Right;  . REDUCTION MAMMAPLASTY Bilateral YRS AGO  . REPLACEMENT TOTAL KNEE Bilateral   . rotator cuff surgery     blilateral  . TONSILECTOMY/ADENOIDECTOMY WITH MYRINGOTOMY      Family History  Problem Relation Age of Onset  . Heart disease Mother   . Stroke Mother   . Hypertension Mother   . Heart disease Father        myocardial infarction age 85  . Breast cancer Neg Hx    Social History   Socioeconomic History  . Marital status: Married    Spouse name: Not on file  . Number of children: 2  . Years of education: Not on file  . Highest education level: Not on file  Occupational History  . Not on file  Social Needs  . Financial resource strain: Not very hard  . Food insecurity:    Worry: Never true    Inability: Never true  . Transportation needs:    Medical: No    Non-medical: No  Tobacco Use  . Smoking status: Never Smoker  . Smokeless tobacco: Never Used  Substance and Sexual Activity  . Alcohol use: No    Alcohol/week: 0.0 standard drinks  . Drug use: Never  . Sexual activity: Not Currently  Lifestyle  . Physical activity:    Days per week: 0 days    Minutes per session: Not on file  . Stress: Not on file  Relationships  . Social connections:    Talks on phone: Patient refused    Gets together: Patient refused    Attends religious service: Patient refused    Active member of club or organization: Patient refused    Attends meetings of clubs or organizations: Patient refused    Relationship status: Patient refused  Other Topics Concern  . Not on file  Social History Narrative  . Not on file    Outpatient Encounter Medications as of 11/11/2018  Medication Sig  . albuterol (PROVENTIL HFA;VENTOLIN HFA) 108 (90 Base) MCG/ACT inhaler Inhale 2 puffs into the lungs every 6 (six) hours as needed for wheezing or shortness of breath.  Marland Kitchen aluminum-magnesium hydroxide-simethicone (MAALOX) 867-619-50 MG/5ML SUSP Take 15 mLs by mouth 4 (four) times daily -  before meals and at bedtime.   Marland Kitchen amLODipine (NORVASC) 10 MG tablet TAKE 1 TABLET BY MOUTH DAILY  . atorvastatin (LIPITOR) 20 MG tablet TAKE ONE TABLET BY MOUTH AT BEDTIME  . azelastine  (ASTELIN) 0.1 % nasal spray Place 1 spray into both nostrils 2 (two) times daily. Use in each nostril as directed  . benazepril (LOTENSIN) 20 MG tablet Take 1 tablet (20 mg total) by mouth daily.  . busPIRone (BUSPAR) 10 MG tablet Take 10 mg by mouth 2 (two) times daily.   . calcium citrate-vitamin D (CITRACAL+D) 315-200 MG-UNIT tablet Take 1 tablet by mouth daily.  [name] Sodium 30-100 MG CAPS Take 1 capsule by mouth daily.   Marland Kitchen  Cholecalciferol (VITAMIN D3) 1000 UNITS CAPS Take by mouth.  . doxycycline (VIBRA-TABS) 100 MG tablet Take 1 tablet (100 mg total) by mouth 2 (two) times daily.  . DULoxetine (CYMBALTA) 60 MG capsule TAKE 1 CAPSULE BY MOUTH EVERY DAY.  Marland Kitchen gabapentin (NEURONTIN) 300 MG capsule Take 600 mg by mouth 2 (two) times daily.   . hydrochlorothiazide (MICROZIDE) 12.5 MG capsule Take 12.5 mg by mouth daily.  Marland Kitchen HYDROcodone-acetaminophen (NORCO) 10-325 MG tablet Take 1 tablet by mouth 3 (three) times daily.  . Inulin (FIBER CHOICE PO) Take 2 tablets by mouth daily. Chew tablets  . levothyroxine (SYNTHROID, LEVOTHROID) 100 MCG tablet Take 1 tablet (100 mcg total) by mouth daily.  . magnesium oxide (MAG-OX) 400 MG tablet Take 1 tablet (400 mg total) by mouth daily.  . metoprolol succinate (TOPROL-XL) 50 MG 24 hr tablet Take 2 tablets by mouth in the morning and 1 tablet by mouth in the evening.  Marland Kitchen morphine (MS CONTIN) 60 MG 12 hr tablet Take 1 tablet (60 mg total) by mouth 2 (two) times daily.  Melynda Ripple Tosylate (SYMPROIC) 0.2 MG TABS Take 0.2 mg by mouth daily.  Marland Kitchen nystatin cream (MYCOSTATIN) Apply 1 application topically daily as needed for itching.  . predniSONE (DELTASONE) 10 MG tablet Take 1 tablet (10 mg total) by mouth daily with breakfast for 5 days.  . Probiotic Product (ALIGN PO) Take by mouth daily.  . RABEprazole (ACIPHEX) 20 MG tablet Take 1 tablet by mouth daily.  Marland Kitchen senna (SENOKOT) 8.6 MG TABS tablet Take 1 tablet by mouth 2 (two) times daily.  .  Simethicone (GAS-X PO) Take 2 tablets by mouth 2 (two) times daily.  . sodium chloride (OCEAN) 0.65 % nasal spray Place 1 spray into the nose as needed.  . triamcinolone cream (KENALOG) 0.1 %    No facility-administered encounter medications on file as of 11/11/2018.     Activities of Daily Living In your present state of health, do you have any difficulty performing the following activities: 11/11/2018 09/04/2018  Hearing? Y Y  Comment Hearing aid -  Vision? N N  Difficulty concentrating or making decisions? N -  Walking or climbing stairs? Y -  Comment - -  Dressing or bathing? N -  Comment - -  Doing errands, shopping? [place] and eating ? Y -  Comment - -  Using the Toilet? N -  Comment - -  In the past six months, have you accidently leaked urine? N -  Do you have problems with loss of bowel control? N -  Managing your Medications? N -  Managing your Finances? - -  Housekeeping or managing your Housekeeping? Y -  Comment - -  Some recent data might be hidden    Patient Care Team: Einar Pheasant, [name], [name], [name], MD (Otolaryngology)    Assessment:   This is a routine wellness examination for Kerina.  Health Screenings  Mammogram -11/11/17 Bone Density -03/18/17 Glaucoma -none Hearing -hearing aids Cholesterol -09/10/18 Dental- every 6 month Vision- annual visits  Oxygen 2L at night in use.   Social  Alcohol intake -no Smoking history- never Smokers in home? none Illicit drug use? none Exercise -none Diet -regular Sexually Active -not currently  Safety  Patient feels safe at home.  Patient does have smoke detectors at home. Patient does wear sunscreen or  protective clothing when in direct sunlight.  Patient does wear seat belt when riding with others. She does not drive.   Activities of Daily Living  Denies needing assistance with: feeding themselves, getting from bed to chair, getting to the toilet, bathing/showering, dressing, managing money. Home health services help with meal prep and household chores. Family assists with driving.   Depression Screen Patient denies losing interest in daily life, feeling hopeless, or crying easily over simple problems.   Fall Screen Patient denies being afraid of falling and has has one fall in the last year.  Followed by pcp.    Memory Screen Patient denies problems with memory, misplacing items, and is able to balance checkbook/bank accounts.  Patient is alert, normal appearance, oriented to person/place/and time. Correctly identified the president of the [place], recall of 2/3 objects, and performing simple calculations.  Patient displays appropriate judgement and can read correct time from watch face.   Immunizations The following Immunizations are up to date: Influenza, pneumonia, and tetanus. Shingles discussed.   Other Providers Patient Care Team: Einar Pheasant, [name], [name], [name], MD (Otolaryngology)  Exercise Activities and Dietary recommendations Current Exercise Habits: The patient does not participate in regular exercise at present  Goals      Patient Stated   . Maintain Healthy Lifestyle (pt-stated)     Stay active Stay hydrated Healthy diet       Fall Risk Fall Risk  11/11/2018 11/02/2018 10/20/2018 09/29/2018 09/21/2018  Falls in the past year? 1 (No Data) (No Data) (No Data) (No Data)  Comment - Patient denies new/ recent falls Patient denies new/ recent falls Denies new/ recent falls Denies new/ recent falls  Number falls in past yr: 0 - - - -  Injury with Fall? 1 - - - -  Comment Followed by pcp - - - -  Risk for fall due to : - - - - Impaired balance/gait;Impaired mobility;History of  fall(s);Medication side effect  Follow up - Falls prevention discussed Falls prevention discussed Falls prevention discussed Falls prevention discussed   Depression Screen PHQ 2/9 Scores 11/11/2018 08/28/2018 09/10/2017 08/02/2016  PHQ - 2 Score - 0 0 0  PHQ- 9 Score - - 0 -  Exception Documentation Patient refusal - - -     Cognitive Function MMSE - Mini Mental State Exam 03/08/2015  Orientation to time 5  Orientation to Place 5  Registration 3  Attention/ Calculation 5  Recall 3  Language- name 2 objects 2  Language- repeat 1  Language- follow 3 step command 3  Language- read & follow direction 1  Write a sentence 1  Copy design 1  Total score 30     6CIT Screen 11/11/2018 09/10/2017 08/02/2016  What Year? 0 points 0 points 0 points  What month? 0 points 0 points 0 points  What time? 0 points 0 points 0 points  Count back from 20 0 points 0 points 0 points  Months in reverse 0 points 0 points 0 points  Repeat phrase 0 points 0 points 0 points  Total Score 0 0 0    Immunization History  Administered Date(s) Administered  . Influenza Split 07/13/2009, 07/03/2012  . Influenza, High Dose Seasonal PF 05/13/2016, 06/11/2017, 06/05/2018  . Influenza,inj,Quad PF,6+ Mos 05/25/2013, 05/31/2014, 04/27/2015  . Pneumococcal Conjugate-13 11/29/2014  . Pneumococcal-Unspecified 06/22/2001  . Tdap 09/17/2017   Screening Tests Health Maintenance  Topic Date  Due  . MAMMOGRAM  11/12/2018  . TETANUS/TDAP  09/18/2027  . INFLUENZA VACCINE  Completed  . DEXA SCAN  Completed  . PNA vac Low Risk Adult  Completed      Plan:    End of life planning; Advance aging; Advanced directives discussed. Copy of current HCPOA/Living Will requested.    I have personally reviewed and noted the following in the patient's chart:   . Medical and social history . Use of alcohol, tobacco or illicit drugs  . Current medications and supplements . Functional ability and status . Nutritional status .  Physical activity . Advanced directives . List of other physicians . Hospitalizations, surgeries, and ER visits in previous 12 months . Vitals . Screenings to include cognitive, depression, and falls . Referrals and appointments  In addition, I have reviewed and discussed with patient certain preventive protocols, quality metrics, and best practice recommendations. A written personalized care plan for preventive services as well as general preventive health recommendations were provided to patient.     OBrien-Blaney, Luciel Brickman L, LPN  12/24/5359   I have reviewed the above information and agree with above.   Deborra Medina, MD

## 2018-11-11 NOTE — Patient Instructions (Addendum)
  Kimberly Roy , Thank you for taking time to come for your Medicare Wellness Visit. I appreciate your ongoing commitment to your health goals. Please review the following plan we discussed and let me know if I can assist you in the future.   These are the goals we discussed: Goals      Patient Stated   . Maintain Healthy Lifestyle (pt-stated)     Stay active Stay hydrated Healthy diet       This is a list of the screening recommended for you and due dates:  Health Maintenance  Topic Date Due  . Mammogram  11/12/2018  . Tetanus Vaccine  09/18/2027  . Flu Shot  Completed  . DEXA scan (bone density measurement)  Completed  . Pneumonia vaccines  Completed

## 2018-11-13 ENCOUNTER — Other Ambulatory Visit: Payer: Self-pay

## 2018-11-13 ENCOUNTER — Ambulatory Visit
Admission: RE | Admit: 2018-11-13 | Discharge: 2018-11-13 | Disposition: A | Discharge status: Home / Self Care | Payer: PPO | Source: Ambulatory Visit | Attending: Internal Medicine | Admitting: Internal Medicine

## 2018-11-13 DIAGNOSIS — Z1231 Encounter for screening mammogram for malignant neoplasm of breast: Secondary | ICD-10-CM | POA: Diagnosis not present

## 2018-11-19 ENCOUNTER — Ambulatory Visit (INDEPENDENT_AMBULATORY_CARE_PROVIDER_SITE_OTHER): Payer: PPO | Admitting: Nurse Practitioner

## 2018-11-19 ENCOUNTER — Encounter (INDEPENDENT_AMBULATORY_CARE_PROVIDER_SITE_OTHER): Payer: PPO

## 2018-11-23 ENCOUNTER — Other Ambulatory Visit: Payer: Self-pay | Admitting: Internal Medicine

## 2018-11-29 DIAGNOSIS — S72341D Displaced spiral fracture of shaft of right femur, subsequent encounter for closed fracture with routine healing: Secondary | ICD-10-CM | POA: Diagnosis not present

## 2018-12-03 DIAGNOSIS — E538 Deficiency of other specified B group vitamins: Secondary | ICD-10-CM | POA: Diagnosis not present

## 2018-12-03 DIAGNOSIS — Z79899 Other long term (current) drug therapy: Secondary | ICD-10-CM | POA: Diagnosis not present

## 2018-12-03 DIAGNOSIS — M48061 Spinal stenosis, lumbar region without neurogenic claudication: Secondary | ICD-10-CM | POA: Diagnosis not present

## 2018-12-03 DIAGNOSIS — S76312A Strain of muscle, fascia and tendon of the posterior muscle group at thigh level, left thigh, initial encounter: Secondary | ICD-10-CM | POA: Diagnosis not present

## 2018-12-03 DIAGNOSIS — M519 Unspecified thoracic, thoracolumbar and lumbosacral intervertebral disc disorder: Secondary | ICD-10-CM | POA: Diagnosis not present

## 2018-12-03 DIAGNOSIS — Q675 Congenital deformity of spine: Secondary | ICD-10-CM | POA: Diagnosis not present

## 2018-12-03 DIAGNOSIS — M5416 Radiculopathy, lumbar region: Secondary | ICD-10-CM | POA: Diagnosis not present

## 2018-12-03 DIAGNOSIS — K5903 Drug induced constipation: Secondary | ICD-10-CM | POA: Diagnosis not present

## 2018-12-04 DIAGNOSIS — S72341D Displaced spiral fracture of shaft of right femur, subsequent encounter for closed fracture with routine healing: Secondary | ICD-10-CM | POA: Diagnosis not present

## 2018-12-15 ENCOUNTER — Telehealth (INDEPENDENT_AMBULATORY_CARE_PROVIDER_SITE_OTHER): Payer: Self-pay

## 2018-12-16 ENCOUNTER — Other Ambulatory Visit (INDEPENDENT_AMBULATORY_CARE_PROVIDER_SITE_OTHER): Payer: Self-pay | Admitting: Nurse Practitioner

## 2018-12-16 DIAGNOSIS — L03119 Cellulitis of unspecified part of limb: Principal | ICD-10-CM

## 2018-12-16 DIAGNOSIS — L02419 Cutaneous abscess of limb, unspecified: Secondary | ICD-10-CM

## 2018-12-16 MED ORDER — DOXYCYCLINE HYCLATE 100 MG PO CAPS: 100.0000 mg | ORAL_CAPSULE | Freq: Two times a day (BID) | ORAL | 0 refills | Status: DC

## 2018-12-16 NOTE — Progress Notes (Signed)
Received a call form Levada Dy at [facility] in regards to Kimberly Roy's legs being red, painful and swollen.  I then spoke with Kimberly Roy directly and she stated that her legs were red,swollen and painful.  She states that her legs go from slightly pinkish to normal but in this instance they are remaining more on the reddish side. She denies using her lymphedema pump at this time.  She said she has two small wounds but they are improving with neosporin.  She states that this has happened previously and antibiotics helped to resolve it.    We will do a short course of antibiotics to see if this clears the redness and pain. Ordinarily, this would require an office visit but given the patient's age and risk of complications from [id number].  If this fails to clear the pain and redness, we will need to see her in office.  I also stressed elevation, as well use of her lymphedema pump twice daily.  She should also being wearing her compression stockings daily.    She also informed us that she would like to wait on the mesenteric angiogram at this time.  She will contact us in one week to inform us how her legs are.

## 2018-12-17 NOTE — Telephone Encounter (Signed)
Patient was informed with Dr Lucky Cowboy medical advice and due to the COVID-19 the patient is waiting to schedule Mesenteric angiogram

## 2018-12-25 ENCOUNTER — Ambulatory Visit: Payer: Self-pay

## 2018-12-25 NOTE — Telephone Encounter (Signed)
Noted.  Ice, elevation.  With wrapping the toe, just monitor the swelling.  Ice should help that.  Make sure tape not to tight.  Keep [place] posted and let us know if needs anything.

## 2018-12-25 NOTE — Telephone Encounter (Signed)
Please call pt and see if it is just toe, or is her foot sore.  If she is unable to stand and increased pain (foot), can see [facility] to confirm if fracture and if anything needs to be done.  Continue elevation, ice.

## 2018-12-25 NOTE — Telephone Encounter (Signed)
It is just her toe. She says she thinks it is okay. She wrapped her toe and can move it and she is walking fine. She is going to let me know if any problems. She says she just panicked after she did it and wanted to make sure she was doing the right thing.

## 2018-12-25 NOTE — Telephone Encounter (Signed)
Noted. Pt is aware

## 2018-12-25 NOTE — Telephone Encounter (Signed)
Pt called and said she was getting up from her chair and her left foot slipped and kicked her husbands wheelchair. She states see believes she broke her 2nd toe. She states it is blue looking and the joint appears to bend downward.  She states that it is painful to put weight on her foot. She denies any open areas. Per protocol call was place to office. Pt will elevate and ice and wait to be notified by office.  Reason for Disposition . Looks like a broken bone (e.g., crooked or deformed)  Answer Assessment - Initial Assessment Questions 1. MECHANISM: "How did the injury happen?"      Slip no fall 2. ONSET: "When did the injury happen?" (Minutes or hours ago)      Left foot 20 minutesago 3. LOCATION: "What part of the toe is injured?" "Is the nail damaged?"     Left 2nd toe 4. APPEARANCE of TOE INJURY: "What does the injury look like?"     Blue knuckle looks out of place bent downward 5. SEVERITY: "Can you use the foot normally?" "Can you walk?"      Foot hurts 6. SIZE: For cuts, bruises, or swelling, ask: "How large is it?" (e.g., inches or centimeters;  entire toe)      swollen 7. PAIN: "Is there pain?" If so, ask: "How bad is the pain?"   (e.g., Scale 1-10; or mild, moderate, severe)     3-4 8. TETANUS: For any breaks in the skin, ask: "When was the last tetanus booster?"     No broken skin 9. DIABETES: "Do you have a history of diabetes or poor circulation in the feet?"     no 10. OTHER SYMPTOMS: "Do you have any other symptoms?"        No 11. PREGNANCY: "Is there any chance you are pregnant?" "When was your last menstrual period?"       N/A  Protocols used: TOE INJURY-A-AH

## 2018-12-30 DIAGNOSIS — S72341D Displaced spiral fracture of shaft of right femur, subsequent encounter for closed fracture with routine healing: Secondary | ICD-10-CM | POA: Diagnosis not present

## 2019-01-11 ENCOUNTER — Telehealth (INDEPENDENT_AMBULATORY_CARE_PROVIDER_SITE_OTHER): Payer: Self-pay | Admitting: Vascular Surgery

## 2019-01-11 NOTE — Telephone Encounter (Signed)
Per patient last visit note.... Dew said if patient wants to discuss procedure she can schedule apt to do so. AS, CMA

## 2019-01-13 ENCOUNTER — Ambulatory Visit (INDEPENDENT_AMBULATORY_CARE_PROVIDER_SITE_OTHER): Payer: PPO | Admitting: Vascular Surgery

## 2019-01-13 ENCOUNTER — Encounter (INDEPENDENT_AMBULATORY_CARE_PROVIDER_SITE_OTHER): Payer: PPO

## 2019-01-19 ENCOUNTER — Encounter: Payer: PPO | Admitting: Internal Medicine

## 2019-01-27 ENCOUNTER — Other Ambulatory Visit: Payer: Self-pay | Admitting: Internal Medicine

## 2019-01-29 ENCOUNTER — Other Ambulatory Visit: Payer: Self-pay | Admitting: Internal Medicine

## 2019-01-29 DIAGNOSIS — S72341D Displaced spiral fracture of shaft of right femur, subsequent encounter for closed fracture with routine healing: Secondary | ICD-10-CM | POA: Diagnosis not present

## 2019-02-02 ENCOUNTER — Encounter (INDEPENDENT_AMBULATORY_CARE_PROVIDER_SITE_OTHER): Payer: Self-pay | Admitting: Vascular Surgery

## 2019-02-02 ENCOUNTER — Other Ambulatory Visit: Payer: Self-pay

## 2019-02-02 ENCOUNTER — Ambulatory Visit (INDEPENDENT_AMBULATORY_CARE_PROVIDER_SITE_OTHER): Payer: PPO | Admitting: Vascular Surgery

## 2019-02-02 ENCOUNTER — Other Ambulatory Visit: Payer: Self-pay | Admitting: Internal Medicine

## 2019-02-02 VITALS — BP 164/63 | HR 49 | Resp 14 | Ht 62.0 in

## 2019-02-02 DIAGNOSIS — I890 Lymphedema, not elsewhere classified: Secondary | ICD-10-CM | POA: Diagnosis not present

## 2019-02-02 DIAGNOSIS — I771 Stricture of artery: Secondary | ICD-10-CM

## 2019-02-02 DIAGNOSIS — E785 Hyperlipidemia, unspecified: Secondary | ICD-10-CM

## 2019-02-02 DIAGNOSIS — K551 Chronic vascular disorders of intestine: Secondary | ICD-10-CM

## 2019-02-02 DIAGNOSIS — Z79899 Other long term (current) drug therapy: Secondary | ICD-10-CM | POA: Diagnosis not present

## 2019-02-02 DIAGNOSIS — I509 Heart failure, unspecified: Secondary | ICD-10-CM | POA: Diagnosis not present

## 2019-02-02 DIAGNOSIS — I10 Essential (primary) hypertension: Secondary | ICD-10-CM

## 2019-02-02 NOTE — Assessment & Plan Note (Signed)
Symptoms worsening.  Now with daily abdominal pain and food fear.  Discussed again options including mesenteric angiogram and possible intervention to her SMA stenosis.  At this time she is interested in proceeding.  This will be scheduled in the near future at her convenience.

## 2019-02-02 NOTE — Patient Instructions (Signed)
Chronic Mesenteric Ischemia  Mesenteric ischemia is poor blood flow (circulation) in the vessels that supply blood to the stomach, intestines, and liver (mesenteric organs). Chronic mesenteric ischemia, also called mesenteric angina or intestinal angina, is a long-term (chronic) condition. It happens when an artery or vein that provides blood to the mesenteric organs gradually becomes blocked or narrow, restricting the blood supply to the organs. When the blood supply is severely restricted, the mesenteric organs cannot work properly. What are the causes? This condition is commonly caused by fatty deposits that build up in an artery (plaque), which can narrow the artery and restrict blood flow. Other causes include:  Weakened areas in blood vessel walls (aneurysms).  Conditions that cause twisting or inflammation of blood vessels, such as fibromuscular dysplasia or arteritis.  A disorder in which blood clots form in the veins (venous thrombosis).  Scarring and thickening (fibrosis) of blood vessels caused by radiation therapy.  A tear in the aorta, the body's main artery (aortic dissection).  Blood vessel problems after illegal drug use, such as use of cocaine.  Tumors in the nervous system (neurofibromatosis).  Certain autoimmune diseases, such as lupus. What increases the risk? The following factors may make you more likely to develop this condition:  Being female.  Being over age 50, especially if you have a history of heart problems.  Smoking.  Congestive heart failure.  Irregular heartbeat (arrhythmia).  Having a history of heart attack or stroke.  Diabetes.  High cholesterol.  High blood pressure (hypertension).  Being overweight or obese.  Kidney disease (renal disease) requiring dialysis. What are the signs or symptoms? Symptoms of this condition include:  Abdomen (abdominal) pain or cramps that develop 15-60 minutes after a meal. This pain may last for 1-3  hours. Some people may develop a fear of eating because of this symptom.  Weight loss.  Diarrhea.  Bloody stool.  Nausea.  Vomiting.  Bloating.  Abdominal pain after stress or with exercise. How is this diagnosed? This condition is diagnosed based on:  Your medical history.  A physical exam.  Tests, such as: ? Ultrasound. ? CT scan. ? Blood tests. ? Urine tests. ? An imaging test that involves injecting a dye into your arteries to show blood flow through blood vessels (angiogram). This can help to show if there are any blockages in the vessels that lead to the intestines. ? Passing a small probe through the mouth and into the stomach to measure the output of carbon dioxide (gastric tonometry). This can help to indicate whether there is decreased blood flow to the stomach and intestines. How is this treated? This condition may be treated with:  Dietary changes such as eating smaller, low-fat, meals more frequently.  Lifestyle changes to treat underlying conditions that contribute to the disease, such as high cholesterol and high blood pressure.  Medicines to reduce blood clotting and increase blood flow.  Surgery to remove the blockage, repair arteries or veins, and restore blood flow. This may involve: ? Angioplasty. This is surgery to widen the affected artery, reduce the blockage, and sometimes insert a small, mesh tube (stent). ? Bypass surgery. This may be done to go around (bypass) the blockage and reconnect healthy arteries or veins. ? Placing a stent in the affected area. This may be done to help keep blocked arteries open. Follow these instructions at home: Eating and drinking  Eat a heart-healthy diet. This includes fresh fruits and vegetables, whole grains, and lean proteins like chicken, fish, eggs,   and beans.  Avoid foods that contain a lot of: ? Salt (sodium). ? Sugar. ? Saturated fat (such as red meat). ? Trans fat (such as fried foods).  Stay  hydrated. Drink enough fluid to keep your urine clear or pale yellow. Lifestyle  Stay active and get regular exercise as told by your health care provider. Aim for 150 minutes of moderate activity or 75 minutes of vigorous activity a week. Ask your health care provider what activities and forms of exercise are safe for you.  Maintain a healthy weight.  Work with your health care provider to manage your cholesterol.  Manage any other health problems you have, such as high blood pressure, diabetes, or heart rhythm problems.  Do not use any products that contain nicotine or tobacco, such as cigarettes and e-cigarettes. If you need help quitting, ask your health care provider. General instructions  Take over-the-counter and prescription medicines only as told by your health care provider.  Keep all follow-up visits as told by your health care provider. This is important. Contact a health care provider if:  Your symptoms do not improve or they return after treatment.  You have a fever. Get help right away if:  You have severe abdominal pain.  You have severe chest pain.  You have shortness of breath.  You feel weak or dizzy.  You have palpitations.  You have numbness or weakness in your face, arm, or leg.  You are confused.  You have trouble speaking or people have trouble understanding what you are saying.  You are constipated.  You have trouble urinating.  You have blood in your stool.  You have severe nausea, vomiting, or persistent diarrhea. Summary  Mesenteric ischemia is poor circulation in the vessels that supply blood to the the stomach, intestines, and liver (mesenteric organs).  This condition happens when an artery or vein that provides blood to the mesenteric organs gradually becomes blocked or narrow, restricting the blood supply to the organs.  This condition is commonly caused by fatty deposits that build up in an artery (plaque), which can narrow the  artery and restrict blood flow.  You are more likely to develop this condition if you are over age 50 and have a history of heart problems, high blood pressure, diabetes, or high cholesterol.  This condition is usually treated with medicines, dietary and lifestyle changes, and surgery to remove the blockage, repair arteries or veins, and restore blood flow. This information is not intended to replace advice given to you by your health care provider. Make sure you discuss any questions you have with your health care provider. Document Released: 04/08/2011 Document Revised: 08/03/2016 Document Reviewed: 08/03/2016 Elsevier Interactive Patient Education  2019 [employer]  

## 2019-02-02 NOTE — Progress Notes (Signed)
MRN : [id number]  Kimberly Roy is a 85 y.o. (01-Aug-1931) female who presents with chief complaint of  Chief Complaint  Patient presents with  . Follow-up    discuss procedure  .  History of Present Illness: Patient returns today in follow up of her SMA stenosis as well as her lower extremity swelling and lymphedema.  Several weeks ago, she was treated with oral antibiotics and compression for what sounded like some cellulitis of her lower extremities.  This resolved and her stable chronic swelling is much better.  She is wearing her compression stockings and elevating her legs. A more pressing issue is her SMA stenosis.  She reports that over this same span, her abdominal pain has worsened and is bothersome to her every day.  She reports pain with almost every meal.  She had previously discussed doing a mesenteric angiogram and possible SMA intervention a few months ago, but COVID-19 put any of those discussions on hold for her.  Her symptoms do seem to be worsening and she is now interested in proceeding even with concerns over COVID-19.  Current Outpatient Medications  Medication Sig Dispense Refill  . aluminum-magnesium hydroxide-simethicone (MAALOX) 845-364-68 MG/5ML SUSP Take 15 mLs by mouth 4 (four) times daily -  before meals and at bedtime.     Marland Kitchen amLODipine (NORVASC) 10 MG tablet TAKE 1 TABLET BY MOUTH DAILY 90 tablet 1  . atorvastatin (LIPITOR) 20 MG tablet TAKE ONE TABLET BY MOUTH AT BEDTIME 90 tablet 1  . azelastine (ASTELIN) 0.1 % nasal spray Place 1 spray into both nostrils 2 (two) times daily. Use in each nostril as directed 30 mL 1  . benazepril (LOTENSIN) 40 MG tablet TAKE 1 TABLET BY MOUTH DAILY 90 tablet 1  . busPIRone (BUSPAR) 10 MG tablet Take 10 mg by mouth 2 (two) times daily.     . calcium citrate-vitamin D (CITRACAL+D) 315-200 MG-UNIT tablet Take 1 tablet by mouth daily.    [name] Sodium 30-100 MG CAPS Take 1 capsule by mouth daily.     .  Cholecalciferol (VITAMIN D3) 1000 UNITS CAPS Take by mouth.    . DULoxetine (CYMBALTA) 60 MG capsule TAKE 1 CAPSULE BY MOUTH EVERY DAY. 30 capsule 3  . furosemide (LASIX) 20 MG tablet TAKE 1 TABLET BY MOUTH EVERY DAY AS NEEDED. 90 tablet 0  . gabapentin (NEURONTIN) 300 MG capsule Take 600 mg by mouth 2 (two) times daily.     . hydrochlorothiazide (MICROZIDE) 12.5 MG capsule Take 12.5 mg by mouth daily.    Marland Kitchen HYDROcodone-acetaminophen (NORCO) 10-325 MG tablet Take 1 tablet by mouth 3 (three) times daily. 10 tablet 0  . Inulin (FIBER CHOICE PO) Take 2 tablets by mouth daily. Chew tablets    . levothyroxine (SYNTHROID, LEVOTHROID) 100 MCG tablet Take 1 tablet (100 mcg total) by mouth daily. 90 tablet 3  . magnesium oxide (MAG-OX) 400 MG tablet Take 1 tablet (400 mg total) by mouth daily. 30 tablet 1  . metoprolol succinate (TOPROL-XL) 50 MG 24 hr tablet Take 2 tablets by mouth in the morning and 1 tablet by mouth in the evening. 270 tablet 1  . morphine (MS CONTIN) 60 MG 12 hr tablet Take 1 tablet (60 mg total) by mouth 2 (two) times daily. 10 tablet 0  . Naldemedine Tosylate (SYMPROIC) 0.2 MG TABS Take 0.2 mg by mouth daily.    Marland Kitchen nystatin cream (MYCOSTATIN) Apply 1 application topically daily as needed for itching.    Marland Kitchen  polyethylene glycol powder (GLYCOLAX/MIRALAX) 17 GM/SCOOP powder MIX 17 GRAMS AS MARKED ON BOTTLE TOP IN 8 OUNCES OF WATER AND DRINK ONCE A DAY AS DIRECTED. 527 g 0  . Probiotic Product (ALIGN PO) Take by mouth daily.    . RABEprazole (ACIPHEX) 20 MG tablet Take 1 tablet by mouth daily.    Marland Kitchen senna (SENOKOT) 8.6 MG TABS tablet Take 1 tablet by mouth 2 (two) times daily.    . Simethicone (GAS-X PO) Take 2 tablets by mouth 2 (two) times daily.    . sodium chloride (OCEAN) 0.65 % nasal spray Place 1 spray into the nose as needed.    . triamcinolone cream (KENALOG) 0.1 %     . albuterol (PROVENTIL HFA;VENTOLIN HFA) 108 (90 Base) MCG/ACT inhaler Inhale 2 puffs into the lungs every 6 (six)  hours as needed for wheezing or shortness of breath. (Patient not taking: Reported on 02/02/2019) 1 Inhaler 0  . benazepril (LOTENSIN) 20 MG tablet Take 1 tablet (20 mg total) by mouth daily. (Patient not taking: Reported on 02/02/2019) 20 tablet 0  . doxycycline (VIBRA-TABS) 100 MG tablet Take 1 tablet (100 mg total) by mouth 2 (two) times daily. (Patient not taking: Reported on 02/02/2019) 20 tablet 0  . doxycycline (VIBRAMYCIN) 100 MG capsule Take 1 capsule (100 mg total) by mouth 2 (two) times daily. (Patient not taking: Reported on 02/02/2019) 14 capsule 0   No current facility-administered medications for this visit.     Past Medical History:  Diagnosis Date  . Anemia   . Anxiety   . Chest pain   . CHF (congestive heart failure) ([place])   . Constipation   . DDD (degenerative disc disease), cervical   . Depression   . DVT (deep venous thrombosis) ([place])   . Dysphonia   . Dyspnea   . Fatty liver   . Fatty liver   . Headache   . Hyperlipidemia   . Hyperpiesia   . Hypertension   . Hypothyroidism   . Interstitial cystitis   . Left ventricular dysfunction   . Lymphedema   . Nephrolithiasis   . Obstructive sleep apnea   . Osteoarthritis    knees/cervical and lumbar spine  . Pulmonary hypertension ([place])   . Pulmonary nodules    followed by Dr Raul Del  . Pure hypercholesterolemia   . Renal cyst    right    Past Surgical History:  Procedure Laterality Date  . ABDOMINAL HYSTERECTOMY     ovaries left in place  . APPENDECTOMY    . Back Surgeries    . BACK SURGERY    . BREAST REDUCTION SURGERY     3/99  . CARDIAC CATHETERIZATION    . cataracts Bilateral   . CERVICAL SPINE SURGERY    . ESOPHAGEAL MANOMETRY N/A 08/02/2015   Procedure: ESOPHAGEAL MANOMETRY (EM);  Surgeon: Josefine Class, MD;  Location: [facility] ENDOSCOPY;  Service: Endoscopy;  Laterality: N/A;  . ESOPHAGOGASTRODUODENOSCOPY N/A 02/27/2015   Procedure: ESOPHAGOGASTRODUODENOSCOPY (EGD);  Surgeon: Hulen Luster, MD;   Location: [facility] ENDOSCOPY;  Service: Gastroenterology;  Laterality: N/A;  . ESOPHAGOGASTRODUODENOSCOPY (EGD) WITH PROPOFOL N/A 10/30/2018   Procedure: ESOPHAGOGASTRODUODENOSCOPY (EGD) WITH PROPOFOL;  Surgeon: Lollie Sails, MD;  Location: [facility] ENDOSCOPY;  Service: Endoscopy;  Laterality: N/A;  . EXCISIONAL HEMORRHOIDECTOMY    . EYE SURGERY    . FRACTURE SURGERY    . HEMORRHOID SURGERY    . HIP SURGERY  2013   Right hip surgery  . JOINT REPLACEMENT    .  KNEE ARTHROSCOPY     left and right  . ORIF FEMUR FRACTURE Right 08/07/2018   Procedure: OPEN REDUCTION INTERNAL FIXATION (ORIF) DISTAL FEMUR FRACTURE;  Surgeon: Hessie Knows, MD;  Location: [facility] ORS;  Service: Orthopedics;  Laterality: Right;  . REDUCTION MAMMAPLASTY Bilateral YRS AGO  . REPLACEMENT TOTAL KNEE Bilateral   . rotator cuff surgery     blilateral  . TONSILECTOMY/ADENOIDECTOMY WITH MYRINGOTOMY      Social History        Tobacco Use  . Smoking status: Never Smoker  . Smokeless tobacco: Never Used  Substance Use Topics  . Alcohol use: No    Alcohol/week: 0.0 standard drinks  . Drug use: No    Family History      Family History  Problem Relation Age of Onset  . Heart disease Mother   . Stroke Mother   . Hypertension Mother   . Heart disease Father    myocardial infarction age 74  . Breast cancer Neg Hx           Allergies  Allergen Reactions  . Lyrica [Pregabalin] Swelling  . Atarax [Hydroxyzine]     jittery  . Dicyclomine Other (See Comments)    Abdominal bloating   . Hydroxyzine Hcl     jittery  . Levaquin [Levofloxacin] Swelling  . Nucynta Er [Tapentadol Hcl Er] Other (See Comments)    Severe constipation   . Oxybutynin Other (See Comments)    Blurred vision  . Zoloft [Sertraline Hcl]     Severe headache  . Biaxin [Clarithromycin] Other (See Comments) and Rash    Pt does not remember Pt does not remember  . Sertraline Nausea And Vomiting     Severe headache Severe headache Other reaction(s): Headache Severe headache  . Sulfa Antibiotics Rash    Pt does not remember  . Sulfasalazine Rash    Pt does not remember  . Tape Rash    [place] - redness  . Tapentadol Other (See Comments) and Rash    _0      REVIEW OF SYSTEMS(Negative unless checked)  Constitutional: _1 ??Weight loss _2 ??Fever _3 ??Chills Cardiac: _4 ??Chest pain _5 ??Chest pressure _6 ??Palpitations _7 ??Shortness of breath when laying flat _8 ??Shortness of breath at rest _9 ??Shortness of breath with exertion. Vascular: _10 ??Pain in legs with walking _11 ??Pain in legs at rest _12 ??Pain in legs when laying flat _13 ??Claudication _14 ??Pain in feet when walking _15 ??Pain in feet at rest _16 ??Pain in feet when laying flat _17 ??History of DVT _18 ??Phlebitis _19 ??Swelling in legs _20 ??Varicose veins _21 ??Non-healing ulcers Pulmonary: _22 ??Uses home oxygen _23 ??Productive cough _24 ??Hemoptysis _25 ??Wheeze _26 ??COPD _27 ??Asthma Neurologic: _28 ??Dizziness _29 ??Blackouts _30 ??Seizures _31 ??History of stroke _32 ??History of TIA _33 ??Aphasia _34 ??Temporary blindness _35 ??Dysphagia _36 ??Weakness or numbness in arms _37 ??Weakness or numbness in legs Musculoskeletal: _38 ??Arthritis _39 ??Joint swelling _40 ??Joint pain _41 ??Low back pain Hematologic: _42 ??Easy bruising _43 ??Easy bleeding _44 ??Hypercoagulable state _45 ??Anemic  Gastrointestinal: _46 ??Blood in stool _47 ??Vomiting blood _48 ??Gastroesophageal reflux/heartburn _49 ??Abdominal pain Genitourinary: _50 ??Chronic kidney disease _51 ??Difficult urination _52 ??Frequent urination _53 ??Burning with urination _54 ??Hematuria Skin: _55 ??Rashes _56 ??Ulcers _57 ??Wounds Psychological: _58 ??History of anxiety _59 ??History of major depression.    Physical Examination  BP  (!) 164/63   Pulse (!) 49   Resp 14   Ht _60  (1.575 m)   BMI 33.84 kg/m  Gen:  WD/WN, NAD Head: [place]/AT, No temporalis wasting. Ear/Nose/Throat: Hearing grossly intact, nares w/o erythema or drainage Eyes: Conjunctiva clear. Sclera non-icteric Neck: Supple.  Trachea midline Pulmonary:  Good air movement, no use of accessory muscles.  Cardiac: RRR, no JVD  Vascular:  Vessel Right Left  Radial Palpable Palpable                                   Gastrointestinal: soft, non-tender/non-distended.  Musculoskeletal: M/S 5/5 throughout.  No deformity or atrophy.  Using a wheelchair.  1+ bilateral lower extremity edema. Neurologic: Sensation grossly intact in extremities.  Symmetrical.  Speech is fluent.  Psychiatric: Judgment intact, Mood & affect appropriate for pt's clinical situation. Dermatologic: No rashes or ulcers noted.  No cellulitis or open wounds.       Labs No results found for this or any previous visit (from the past 2160 hour(s)).  Radiology No results found.  Assessment/Plan CHF (congestive heart failure) Can contribute to lower extremity swelling.  Hypercholesterolemia lipid control important in reducing the progression of atherosclerotic disease. Continue statin therapy   Hypertension blood pressure control important in reducing the progression of atherosclerotic disease. On appropriate oral medications.  Lymphedema Improved after course of antibiotics about 6 weeks ago.  Stable chronic swelling at this point.  Superior mesenteric artery stenosis (HCC) Symptoms worsening.  Now with daily abdominal pain and food fear.  Discussed again options including mesenteric angiogram and possible intervention to her SMA stenosis.  At this time she is interested in proceeding.  This will be scheduled in the near future at her convenience.    Leotis Pain, MD  02/02/2019 1:54 PM    This note was created with Dragon medical transcription system.  Any errors  from dictation are purely unintentional

## 2019-02-02 NOTE — Assessment & Plan Note (Signed)
Improved after course of antibiotics about 6 weeks ago.  Stable chronic swelling at this point.

## 2019-02-04 ENCOUNTER — Encounter (INDEPENDENT_AMBULATORY_CARE_PROVIDER_SITE_OTHER): Payer: Self-pay

## 2019-02-09 ENCOUNTER — Ambulatory Visit (INDEPENDENT_AMBULATORY_CARE_PROVIDER_SITE_OTHER): Payer: PPO | Admitting: Internal Medicine

## 2019-02-09 ENCOUNTER — Encounter: Payer: Self-pay | Admitting: Internal Medicine

## 2019-02-09 ENCOUNTER — Other Ambulatory Visit: Payer: Self-pay

## 2019-02-09 DIAGNOSIS — I272 Pulmonary hypertension, unspecified: Secondary | ICD-10-CM | POA: Diagnosis not present

## 2019-02-09 DIAGNOSIS — F439 Reaction to severe stress, unspecified: Secondary | ICD-10-CM

## 2019-02-09 DIAGNOSIS — K551 Chronic vascular disorders of intestine: Secondary | ICD-10-CM

## 2019-02-09 DIAGNOSIS — R1084 Generalized abdominal pain: Secondary | ICD-10-CM

## 2019-02-09 DIAGNOSIS — I771 Stricture of artery: Secondary | ICD-10-CM | POA: Diagnosis not present

## 2019-02-09 DIAGNOSIS — E785 Hyperlipidemia, unspecified: Secondary | ICD-10-CM

## 2019-02-09 DIAGNOSIS — K219 Gastro-esophageal reflux disease without esophagitis: Secondary | ICD-10-CM | POA: Diagnosis not present

## 2019-02-09 DIAGNOSIS — I10 Essential (primary) hypertension: Secondary | ICD-10-CM | POA: Diagnosis not present

## 2019-02-09 DIAGNOSIS — G4733 Obstructive sleep apnea (adult) (pediatric): Secondary | ICD-10-CM | POA: Diagnosis not present

## 2019-02-09 DIAGNOSIS — E039 Hypothyroidism, unspecified: Secondary | ICD-10-CM | POA: Diagnosis not present

## 2019-02-09 DIAGNOSIS — D649 Anemia, unspecified: Secondary | ICD-10-CM | POA: Diagnosis not present

## 2019-02-09 DIAGNOSIS — I872 Venous insufficiency (chronic) (peripheral): Secondary | ICD-10-CM

## 2019-02-09 NOTE — Progress Notes (Signed)
Patient ID: Kimberly Roy, female   DOB: 08-13-1931, 85 y.o.   MRN: [id number]   Virtual Visit via video Note  This visit type was conducted due to national recommendations for restrictions regarding the COVID-19 pandemic (e.g. social distancing).  This format is felt to be most appropriate for this patient at this time.  All issues noted in this document were discussed and addressed.  No physical exam was performed (except for noted visual exam findings with Video Visits).   I connected with Kimberly Roy by a video enabled telemedicine application and verified that I am speaking with the correct person using two identifiers. Location patient: home Location provider: work  Persons participating in the virtual visit: patient, provider  I discussed the limitations, risks, security and privacy concerns of performing an evaluation and management service by video and the availability of in person appointments. The patient expressed understanding and agreed to proceed.  Interactive audio and video telecommunications were attempted between this provider and patient and were successful at first.  Due to patient having technical difficulties we had to convert the visit to a telephone visit.    We continued and completed visit with audio only.   Reason for visit: scheduled follow up.   HPI: Increased stress with her husband's health issues.  Discussed with her today.  Overall she feels she is handling things relatively well.  Sees Dr Nicolasa Ducking.  Still having abdominal discomfort after eating.  Saw [facility] - SMA stenosis.  Angiogram planned 02/25/19.  Legs doing better if elevating and wearing compression hose.  Has been taking metoprolol.  She adjusted her metoprolol dose to 133m in am and 564mq pm.  Blood pressures have been doing well since the change averaging 130s/50-60. No chest pain.  No acid reflux.  Bowels are moving.  No nausea or vomiting.      ROS: See pertinent positives and negatives per HPI.  Past  Medical History:  Diagnosis Date  . Anemia   . Anxiety   . Chest pain   . CHF (congestive heart failure) ([place]  . Constipation   . DDD (degenerative disc disease), cervical   . Depression   . DVT (deep venous thrombosis) ([facility]  . Dysphonia   . Dyspnea   . Fatty liver   . Fatty liver   . Headache   . Hyperlipidemia   . Hyperpiesia   . Hypertension   . Hypothyroidism   . Interstitial cystitis   . Left ventricular dysfunction   . Lymphedema   . Nephrolithiasis   . Obstructive sleep apnea   . Osteoarthritis    knees/cervical and lumbar spine  . Pulmonary hypertension (HCS[place]  . Pulmonary nodules    followed by Dr FlRaul Del. Pure hypercholesterolemia   . Renal cyst    right    Past Surgical History:  Procedure Laterality Date  . ABDOMINAL HYSTERECTOMY     ovaries left in place  . APPENDECTOMY    . Back Surgeries    . BACK SURGERY    . BREAST REDUCTION SURGERY     3/99  . CARDIAC CATHETERIZATION    . cataracts Bilateral   . CERVICAL SPINE SURGERY    . ESOPHAGEAL MANOMETRY N/A 08/02/2015   Procedure: ESOPHAGEAL MANOMETRY (EM);  Surgeon: MaJosefine ClassMD;  [facility];  Service: Endoscopy;  Laterality: N/A;  . ESOPHAGOGASTRODUODENOSCOPY N/A 02/27/2015   Procedure: ESOPHAGOGASTRODUODENOSCOPY (EGD);  Surgeon: PaHulen LusterMD;  [facility];  Service: Gastroenterology;  Laterality: N/A;  . ESOPHAGOGASTRODUODENOSCOPY (EGD) WITH PROPOFOL N/A 10/30/2018   Procedure: ESOPHAGOGASTRODUODENOSCOPY (EGD) WITH PROPOFOL;  Surgeon: Lollie Sails, MD;  Location[facility]COPY;  Service: Endoscopy;  Laterality: N/A;  . EXCISIONAL HEMORRHOIDECTOMY    . EYE SURGERY    . FRACTURE SURGERY    . HEMORRHOID SURGERY    . HIP SURGERY  2013   Right hip surgery  . JOINT REPLACEMENT    . KNEE ARTHROSCOPY     left and right  . ORIF FEMUR FRACTURE Right 08/07/2018   Procedure: OPEN REDUCTION INTERNAL FIXATION (ORIF) DISTAL FEMUR FRACTURE;  Surgeon: Hessie Knows, MD;  Location: [facility] ORS;  Service: Orthopedics;  Laterality: Right;  . REDUCTION MAMMAPLASTY Bilateral YRS AGO  . REPLACEMENT TOTAL KNEE Bilateral   . rotator cuff surgery     blilateral  . TONSILECTOMY/ADENOIDECTOMY WITH MYRINGOTOMY      Family History  Problem Relation Age of Onset  . Heart disease Mother   . Stroke Mother   . Hypertension Mother   . Heart disease Father        myocardial infarction age 53  . Breast cancer Neg Hx     SOCIAL HX: reviewed.    Current Outpatient Medications:  .  aluminum-magnesium hydroxide-simethicone (MAALOX) 272-536-64 MG/5ML SUSP, Take 15 mLs by mouth 4 (four) times daily -  before meals and at bedtime. , Disp: , Rfl:  .  amLODipine (NORVASC) 10 MG tablet, TAKE 1 TABLET BY MOUTH DAILY, Disp: 90 tablet, Rfl: 1 .  atorvastatin (LIPITOR) 20 MG tablet, TAKE ONE TABLET BY MOUTH AT BEDTIME, Disp: 90 tablet, Rfl: 1 .  azelastine (ASTELIN) 0.1 % nasal spray, Place 1 spray into both nostrils 2 (two) times daily. Use in each nostril as directed, Disp: 30 mL, Rfl: 1 .  benazepril (LOTENSIN) 40 MG tablet, TAKE 1 TABLET BY MOUTH DAILY, Disp: 90 tablet, Rfl: 1 .  busPIRone (BUSPAR) 10 MG tablet, Take 10 mg by mouth 2 (two) times daily. , Disp: , Rfl:  .  calcium citrate-vitamin D (CITRACAL+D) 315-200 MG-UNIT tablet, Take 1 tablet by mouth daily., Disp: , Rfl:  .  Casanthranol-Docusate Sodium 30-100 MG CAPS, Take 1 capsule by mouth daily. , Disp: , Rfl:  .  Cholecalciferol (VITAMIN D3) 1000 UNITS CAPS, Take by mouth., Disp: , Rfl:  .  DULoxetine (CYMBALTA) 60 MG capsule, TAKE 1 CAPSULE BY MOUTH EVERY DAY., Disp: 30 capsule, Rfl: 3 .  furosemide (LASIX) 20 MG tablet, TAKE 1 TABLET BY MOUTH EVERY DAY AS NEEDED., Disp: 90 tablet, Rfl: 0 .  gabapentin (NEURONTIN) 300 MG capsule, Take 600 mg by mouth 2 (two) times daily. , Disp: , Rfl:  .  hydrochlorothiazide (MICROZIDE) 12.5 MG capsule, Take 12.5 mg by mouth daily., Disp: , Rfl:  .   HYDROcodone-acetaminophen (NORCO) 10-325 MG tablet, Take 1 tablet by mouth 3 (three) times daily., Disp: 10 tablet, Rfl: 0 .  Inulin (FIBER CHOICE PO), Take 2 tablets by mouth daily. Chew tablets, Disp: , Rfl:  .  levothyroxine (SYNTHROID, LEVOTHROID) 100 MCG tablet, Take 1 tablet (100 mcg total) by mouth daily., Disp: 90 tablet, Rfl: 3 .  magnesium oxide (MAG-OX) 400 MG tablet, Take 1 tablet (400 mg total) by mouth daily., Disp: 30 tablet, Rfl: 1 .  metoprolol succinate (TOPROL-XL) 50 MG 24 hr tablet, Take 2 tablets by mouth in the morning and 1 tablet by mouth in the evening., Disp: 270 tablet, Rfl: 1 .  morphine (MS CONTIN)  60 MG 12 hr tablet, Take 1 tablet (60 mg total) by mouth 2 (two) times daily., Disp: 10 tablet, Rfl: 0 .  Naldemedine Tosylate (SYMPROIC) 0.2 MG TABS, Take 0.2 mg by mouth daily., Disp: , Rfl:  .  nystatin cream (MYCOSTATIN), Apply 1 application topically daily as needed for itching., Disp: , Rfl:  .  polyethylene glycol powder (GLYCOLAX/MIRALAX) 17 GM/SCOOP powder, MIX 17 GRAMS AS MARKED ON BOTTLE TOP IN 8 OUNCES OF WATER AND DRINK ONCE A DAY AS DIRECTED., Disp: 527 g, Rfl: 0 .  Probiotic Product (ALIGN PO), Take by mouth daily., Disp: , Rfl:  .  RABEprazole (ACIPHEX) 20 MG tablet, Take 1 tablet by mouth daily., Disp: , Rfl:  .  senna (SENOKOT) 8.6 MG TABS tablet, Take 1 tablet by mouth 2 (two) times daily., Disp: , Rfl:  .  Simethicone (GAS-X PO), Take 2 tablets by mouth 2 (two) times daily., Disp: , Rfl:  .  sodium chloride (OCEAN) 0.65 % nasal spray, Place 1 spray into the nose as needed., Disp: , Rfl:  .  triamcinolone cream (KENALOG) 0.1 %, , Disp: , Rfl:   EXAM:  VITALS per patient:  134/54  GENERAL: alert, oriented, appears well and in no acute distress  HEENT: atraumatic, conjunttiva clear, no obvious abnormalities on inspection of external nose and ears  NECK: normal movements of the head and neck  LUNGS: on inspection no signs of respiratory distress,  breathing rate appears normal, no obvious gross SOB, gasping or wheezing  CV: no obvious cyanosis  PSYCH/NEURO: pleasant and cooperative, no obvious depression or anxiety, speech and thought processing grossly intact  ASSESSMENT AND PLAN:  Discussed the following assessment and plan:  Abdominal pain Seeing [facility].  Pain after eating. With SMA stenosis.  Planning for angiogram 02/25/19.    Anemia Has been followed by hematology.  Last hgb stable.    Chronic venous insufficiency Continue leg elevation and compression hose.  Doing well.   GERD (gastroesophageal reflux disease) Controlled on current regimen.    Hyperlipidemia On lipitor.  Low cholesterol diet and exercise.  Follow lipid panel and liver function tests.    Hypertension She reduced metoprolol dose to 114m in am and 548min pm.  Blood pressures doing well.  Continue current medication regimen.  Follow pressures.  Follow metabolic panel.    Hypothyroidism On thyroid replacement.  Follow tsh.    OSA (obstructive sleep apnea) Continue cpap.   Pulmonary hypertension (HCArtesiaFollowed by cardiology.  Continue cpap.    Superior mesenteric artery stenosis (HC[facility]atte WoodsSeeing [facility].  Planning for angiogram 02/25/19.    Stress Discussed with her today.  Seeing Dr KaNicolasa Ducking     I discussed the assessment and treatment plan with the patient. The patient was provided an opportunity to ask questions and all were answered. The patient agreed with the plan and demonstrated an understanding of the instructions.   The patient was advised to call back or seek an in-person evaluation if the symptoms worsen or if the condition fails to improve as anticipated.  I provided 25 minutes of non-face-to-face time during this encounter.   ChEinar PheasantMD

## 2019-02-14 NOTE — Assessment & Plan Note (Signed)
Followed by cardiology.  Continue cpap.

## 2019-02-14 NOTE — Assessment & Plan Note (Signed)
She reduced metoprolol dose to 182m in am and 572min pm.  Blood pressures doing well.  Continue current medication regimen.  Follow pressures.  Follow metabolic panel.

## 2019-02-14 NOTE — Assessment & Plan Note (Signed)
On lipitor.  Low cholesterol diet and exercise.  Follow lipid panel and liver function tests.   

## 2019-02-14 NOTE — Assessment & Plan Note (Signed)
Has been followed by hematology.  Last hgb stable.

## 2019-02-14 NOTE — Assessment & Plan Note (Signed)
On thyroid replacement.  Follow tsh.

## 2019-02-14 NOTE — Assessment & Plan Note (Signed)
Controlled on current regimen.   

## 2019-02-14 NOTE — Assessment & Plan Note (Signed)
Seeing [facility].  Planning for angiogram 02/25/19.

## 2019-02-14 NOTE — Assessment & Plan Note (Signed)
Seeing [facility].  Pain after eating. With SMA stenosis.  Planning for angiogram 02/25/19.

## 2019-02-14 NOTE — Assessment & Plan Note (Signed)
Discussed with her today.  Seeing Dr Nicolasa Ducking.

## 2019-02-14 NOTE — Assessment & Plan Note (Signed)
Continue leg elevation and compression hose.  Doing well.

## 2019-02-14 NOTE — Assessment & Plan Note (Signed)
Continue cpap.  

## 2019-02-21 ENCOUNTER — Other Ambulatory Visit (INDEPENDENT_AMBULATORY_CARE_PROVIDER_SITE_OTHER): Payer: Self-pay | Admitting: Nurse Practitioner

## 2019-02-22 ENCOUNTER — Other Ambulatory Visit: Payer: Self-pay

## 2019-02-22 ENCOUNTER — Other Ambulatory Visit
Admission: RE | Admit: 2019-02-22 | Discharge: 2019-02-22 | Disposition: A | Discharge status: Home / Self Care | Payer: PPO | Source: Ambulatory Visit | Attending: Vascular Surgery | Admitting: Vascular Surgery

## 2019-02-22 DIAGNOSIS — Z01812 Encounter for preprocedural laboratory examination: Secondary | ICD-10-CM | POA: Insufficient documentation

## 2019-02-22 DIAGNOSIS — K551 Chronic vascular disorders of intestine: Secondary | ICD-10-CM | POA: Insufficient documentation

## 2019-02-22 DIAGNOSIS — Z1159 Encounter for screening for other viral diseases: Secondary | ICD-10-CM | POA: Diagnosis not present

## 2019-02-22 LAB — CREATININE, SERUM
Creatinine, Ser: 0.78 mg/dL (ref 0.44–1.00)
GFR calc Af Amer: >60 mL/min (ref >60)
GFR calc non Af Amer: >60 mL/min (ref >60)

## 2019-02-22 LAB — BUN: BUN: 22 mg/dL (ref 8–23)

## 2019-02-23 LAB — NOVEL CORONAVIRUS, NAA (HOSP ORDER, SEND-OUT TO REF LAB; TAT 18-24 HRS): SARS-CoV-2, NAA: NOT DETECTED

## 2019-02-25 ENCOUNTER — Encounter
Admission: RE | Disposition: A | Discharge status: Home / Self Care | Payer: Self-pay | Source: Home / Self Care | Attending: Vascular Surgery

## 2019-02-25 ENCOUNTER — Other Ambulatory Visit: Payer: Self-pay

## 2019-02-25 ENCOUNTER — Ambulatory Visit
Admission: RE | Admit: 2019-02-25 | Discharge: 2019-02-25 | Disposition: A | Discharge status: Home / Self Care | Payer: PPO | Attending: Vascular Surgery | Admitting: Vascular Surgery

## 2019-02-25 DIAGNOSIS — Z79890 Hormone replacement therapy (postmenopausal): Secondary | ICD-10-CM | POA: Diagnosis not present

## 2019-02-25 DIAGNOSIS — Z881 Allergy status to other antibiotic agents status: Secondary | ICD-10-CM | POA: Insufficient documentation

## 2019-02-25 DIAGNOSIS — Z882 Allergy status to sulfonamides status: Secondary | ICD-10-CM | POA: Insufficient documentation

## 2019-02-25 DIAGNOSIS — E039 Hypothyroidism, unspecified: Secondary | ICD-10-CM | POA: Diagnosis not present

## 2019-02-25 DIAGNOSIS — Z86718 Personal history of other venous thrombosis and embolism: Secondary | ICD-10-CM | POA: Diagnosis not present

## 2019-02-25 DIAGNOSIS — K551 Chronic vascular disorders of intestine: Secondary | ICD-10-CM

## 2019-02-25 DIAGNOSIS — I509 Heart failure, unspecified: Secondary | ICD-10-CM | POA: Insufficient documentation

## 2019-02-25 DIAGNOSIS — Z8249 Family history of ischemic heart disease and other diseases of the circulatory system: Secondary | ICD-10-CM | POA: Insufficient documentation

## 2019-02-25 DIAGNOSIS — I272 Pulmonary hypertension, unspecified: Secondary | ICD-10-CM | POA: Insufficient documentation

## 2019-02-25 DIAGNOSIS — Z794 Long term (current) use of insulin: Secondary | ICD-10-CM | POA: Insufficient documentation

## 2019-02-25 DIAGNOSIS — K760 Fatty (change of) liver, not elsewhere classified: Secondary | ICD-10-CM | POA: Diagnosis not present

## 2019-02-25 DIAGNOSIS — Z888 Allergy status to other drugs, medicaments and biological substances status: Secondary | ICD-10-CM | POA: Insufficient documentation

## 2019-02-25 DIAGNOSIS — Z823 Family history of stroke: Secondary | ICD-10-CM | POA: Diagnosis not present

## 2019-02-25 DIAGNOSIS — R109 Unspecified abdominal pain: Secondary | ICD-10-CM | POA: Diagnosis not present

## 2019-02-25 DIAGNOSIS — Z90710 Acquired absence of both cervix and uterus: Secondary | ICD-10-CM | POA: Insufficient documentation

## 2019-02-25 DIAGNOSIS — I110 Hypertensive heart disease with heart failure: Secondary | ICD-10-CM | POA: Diagnosis not present

## 2019-02-25 DIAGNOSIS — E785 Hyperlipidemia, unspecified: Secondary | ICD-10-CM | POA: Diagnosis not present

## 2019-02-25 DIAGNOSIS — M1990 Unspecified osteoarthritis, unspecified site: Secondary | ICD-10-CM | POA: Insufficient documentation

## 2019-02-25 DIAGNOSIS — I890 Lymphedema, not elsewhere classified: Secondary | ICD-10-CM | POA: Insufficient documentation

## 2019-02-25 DIAGNOSIS — Z79899 Other long term (current) drug therapy: Secondary | ICD-10-CM | POA: Insufficient documentation

## 2019-02-25 DIAGNOSIS — Z96653 Presence of artificial knee joint, bilateral: Secondary | ICD-10-CM | POA: Diagnosis not present

## 2019-02-25 DIAGNOSIS — I771 Stricture of artery: Secondary | ICD-10-CM | POA: Diagnosis present

## 2019-02-25 HISTORY — PX: VISCERAL ARTERY INTERVENTION: CATH118277

## 2019-02-25 SURGERY — VISCERAL ARTERY INTERVENTION
Anesthesia: Moderate Sedation

## 2019-02-25 MED ORDER — MIDAZOLAM HCL 2 MG/ML PO SYRP: 8.0000 mg | ORAL_SOLUTION | Freq: Once | ORAL | Status: DC | PRN

## 2019-02-25 MED ORDER — DIPHENHYDRAMINE HCL 50 MG/ML IJ SOLN: 50.0000 mg | Freq: Once | INTRAMUSCULAR | Status: DC | PRN

## 2019-02-25 MED ORDER — METHYLPREDNISOLONE SODIUM SUCC 125 MG IJ SOLR: 125.0000 mg | Freq: Once | INTRAMUSCULAR | Status: DC | PRN

## 2019-02-25 MED ORDER — FENTANYL CITRATE (PF) 100 MCG/2ML IJ SOLN
INTRAMUSCULAR | Status: DC | PRN
  Administered 2019-02-25: 25 ug via INTRAVENOUS

## 2019-02-25 MED ORDER — MIDAZOLAM HCL 2 MG/2ML IJ SOLN
INTRAMUSCULAR | Status: DC | PRN
  Administered 2019-02-25: 1 mg via INTRAVENOUS

## 2019-02-25 MED ORDER — MIDAZOLAM HCL 5 MG/5ML IJ SOLN
INTRAMUSCULAR | Status: AC
  Administered 2019-02-25: 5 mg
  Filled 2019-02-25: qty 5

## 2019-02-25 MED ORDER — HYDROMORPHONE HCL 1 MG/ML IJ SOLN
INTRAMUSCULAR | Status: AC
  Filled 2019-02-25: qty 0.5

## 2019-02-25 MED ORDER — CEFAZOLIN SODIUM-DEXTROSE 2-4 GM/100ML-% IV SOLN: 2.0000 g | Freq: Once | INTRAVENOUS | Status: DC

## 2019-02-25 MED ORDER — LIDOCAINE-EPINEPHRINE (PF) 1 %-1:200000 IJ SOLN
INTRAMUSCULAR | Status: AC
  Filled 2019-02-25: qty 30

## 2019-02-25 MED ORDER — FAMOTIDINE 20 MG PO TABS: 40.0000 mg | ORAL_TABLET | Freq: Once | ORAL | Status: DC | PRN

## 2019-02-25 MED ORDER — HEPARIN SODIUM (PORCINE) 1000 UNIT/ML IJ SOLN
INTRAMUSCULAR | Status: AC
  Filled 2019-02-25: qty 1

## 2019-02-25 MED ORDER — IODIXANOL 320 MG/ML IV SOLN
INTRAVENOUS | Status: DC | PRN
  Administered 2019-02-25: 60 mL via INTRA_ARTERIAL

## 2019-02-25 MED ORDER — MIDAZOLAM HCL 2 MG/2ML IJ SOLN
INTRAMUSCULAR | Status: DC | PRN
  Administered 2019-02-25 (×2): 1 mg via INTRAVENOUS

## 2019-02-25 MED ORDER — FENTANYL CITRATE (PF) 100 MCG/2ML IJ SOLN
INTRAMUSCULAR | Status: DC | PRN
  Administered 2019-02-25: 50 ug via INTRAVENOUS

## 2019-02-25 MED ORDER — HYDROMORPHONE HCL 1 MG/ML IJ SOLN
1.0000 mg | Freq: Once | INTRAMUSCULAR | Status: AC | PRN
  Administered 2019-02-25: 0.5 mg via INTRAVENOUS

## 2019-02-25 MED ORDER — FENTANYL CITRATE (PF) 100 MCG/2ML IJ SOLN
INTRAMUSCULAR | Status: AC
  Administered 2019-02-25: 100 ug
  Filled 2019-02-25: qty 2

## 2019-02-25 MED ORDER — SODIUM CHLORIDE 0.9 % IV SOLN
INTRAVENOUS | Status: DC
  Administered 2019-02-25: 08:00:00 via INTRAVENOUS

## 2019-02-25 MED ORDER — ONDANSETRON HCL 4 MG/2ML IJ SOLN
4.0000 mg | Freq: Four times a day (QID) | INTRAMUSCULAR | Status: DC | PRN
  Administered 2019-02-25: 4 mg via INTRAVENOUS

## 2019-02-25 MED ORDER — CEFAZOLIN SODIUM-DEXTROSE 2-4 GM/100ML-% IV SOLN
INTRAVENOUS | Status: AC
  Administered 2019-02-25: 2000 mg
  Filled 2019-02-25: qty 100

## 2019-02-25 MED ORDER — ONDANSETRON HCL 4 MG/2ML IJ SOLN
INTRAMUSCULAR | Status: AC
  Filled 2019-02-25: qty 2

## 2019-02-25 SURGICAL SUPPLY — 11 items
CATH IMAGER II S 5FR 65CM (CATHETERS) ×1 IMPLANT
CATH VS15FR (CATHETERS) ×1 IMPLANT
COVER PROBE U/S 5X48 (MISCELLANEOUS) ×1 IMPLANT
DEVICE STARCLOSE SE CLOSURE (Vascular Products) ×1 IMPLANT
DEVICE TORQUE .025-.038 (MISCELLANEOUS) ×1 IMPLANT
GLIDEWIRE STIFF .35X180X3 HYDR (WIRE) ×1 IMPLANT
PACK ANGIOGRAPHY (CUSTOM PROCEDURE TRAY) ×1 IMPLANT
SHEATH BRITE TIP 5FRX11 (SHEATH) ×1 IMPLANT
SYR MEDRAD MARK 7 150ML (SYRINGE) ×1 IMPLANT
TUBING CONTRAST HIGH PRESS 72 (TUBING) ×1 IMPLANT
WIRE J 3MM .035X145CM (WIRE) ×1 IMPLANT

## 2019-02-25 NOTE — Op Note (Signed)
[place] [facility] Percutaneous Study/Intervention Procedural Note   Date: 02/25/2019  Surgeon(s): Leotis Pain, MD  Assistants: none  Pre-operative Diagnosis: 1.  Abdominal pain worrisome for chronic mesenteric ischemia 2.  Duplex suggesting SMA stenosis   Post-operative diagnosis: Same but no findings on angiography of SMA or celiac artery stenosis  Procedure(s) Performed: 1. Ultrasound guidance for vascular access right femoral artery 2. Catheter placement into SMA and celiac artery from right femoral approach 3. Aortogram and selective angiogram of the SMA and celiac arteries 4. StarClose closure device right femoral artery  Contrast: 60 cc  Fluoro time: 5.2 minutes  EBL: 3 cc  Anesthesia: Approximately 30 minutes of Moderate conscious sedation using 2 mg of Versed and 75 Mcg of Fentanyl  Indications: Patient is a 85 y.o. female who has symptoms consistent with mesenteric ischemia. The patient has a duplex showing possible SMA stenosis. The patient is brought in for angiography for further evaluation and potential treatment. Risks and benefits are discussed and informed consent is obtained  Procedure: The patient was identified and appropriate procedural time out was performed. The patient was then placed supine on the table and prepped and draped in the usual sterile fashion.Moderate conscious sedation was administered during a face to face encounter with the patient throughout the procedure with my supervision of the RN administering medicines and monitoring the patient's vital signs, pulse oximetry, telemetry and mental status throughout from the start of the procedure until the patient was taken to the recovery room. Ultrasound was used to evaluate the right common femoral artery. It was patent . A digital ultrasound image was acquired. A Seldinger needle was used to access the right common  femoral artery under direct ultrasound guidance and a permanent image was performed. A 0.035 J wire was advanced without resistance and a 5Fr sheath was placed. Pigtail catheter was placed into the aorta and an AP aortogram was performed. This demonstrated reasonably normal but tortuous aorta and iliac arteries.  The renal arteries appeared normal.  The AP projection showed good flow through the celiac and SMA although their origins were a little difficult to evaluate.  We transitioned to a steep RAO projection and then selectively cannulated the SMA first and perform selective imaging which showed no significant stenosis within the superior mesenteric artery with brisk flow.  We then cannulated the celiac artery perform selective imaging which showed no significant stenosis of the celiac artery with brisk flow.  With these findings, there was clearly no significant lesion causing her abdominal pain from lack of flow and no intervention was warranted.  At this point, I elected to terminate the procedure. The diagnostic catheter was removed. StarClose closure device was deployed in usual fashion with excellent hemostatic result. The patient was taken to the recovery room in stable condition having tolerated the procedure well.     Findings:Reasonably normal but tortuous aorta and iliac arteries.  The renal arteries appeared normal.  The AP projection showed good flow through the celiac and SMA although their origins were a little difficult to evaluate.  We transitioned to a steep RAO projection and then selectively cannulated the SMA first and perform selective imaging which showed no significant stenosis within the superior mesenteric artery with brisk flow.  We then cannulated the celiac artery perform selective imaging which showed no significant stenosis of the celiac artery with brisk flow.  Disposition: Patient was taken to the recovery room in stable condition having tolerated the procedure  well.  Complications: None  Leotis Pain 02/25/2019 9:05 AM   This note was created with Dragon Medical transcription system. Any errors in dictation are purely unintentional.

## 2019-02-25 NOTE — H&P (Signed)
[place] [facility] History & Physical Update  The patient was interviewed and re-examined.  The patient's previous History and Physical has been reviewed and is unchanged.  There is no change in the plan of care. We plan to proceed with the scheduled procedure.  Leotis Pain, MD  02/25/2019, 8:12 AM

## 2019-02-26 ENCOUNTER — Encounter: Payer: Self-pay | Admitting: Vascular Surgery

## 2019-03-01 DIAGNOSIS — S72341D Displaced spiral fracture of shaft of right femur, subsequent encounter for closed fracture with routine healing: Secondary | ICD-10-CM | POA: Diagnosis not present

## 2019-03-02 DIAGNOSIS — F419 Anxiety disorder, unspecified: Secondary | ICD-10-CM | POA: Diagnosis not present

## 2019-03-02 DIAGNOSIS — F330 Major depressive disorder, recurrent, mild: Secondary | ICD-10-CM | POA: Diagnosis not present

## 2019-03-02 DIAGNOSIS — F5105 Insomnia due to other mental disorder: Secondary | ICD-10-CM | POA: Diagnosis not present

## 2019-03-11 DIAGNOSIS — M519 Unspecified thoracic, thoracolumbar and lumbosacral intervertebral disc disorder: Secondary | ICD-10-CM | POA: Diagnosis not present

## 2019-03-11 DIAGNOSIS — M5416 Radiculopathy, lumbar region: Secondary | ICD-10-CM | POA: Diagnosis not present

## 2019-03-11 DIAGNOSIS — K5903 Drug induced constipation: Secondary | ICD-10-CM | POA: Diagnosis not present

## 2019-03-11 DIAGNOSIS — Z79899 Other long term (current) drug therapy: Secondary | ICD-10-CM | POA: Diagnosis not present

## 2019-03-11 DIAGNOSIS — Q675 Congenital deformity of spine: Secondary | ICD-10-CM | POA: Diagnosis not present

## 2019-03-11 DIAGNOSIS — M48061 Spinal stenosis, lumbar region without neurogenic claudication: Secondary | ICD-10-CM | POA: Diagnosis not present

## 2019-03-11 DIAGNOSIS — S76312A Strain of muscle, fascia and tendon of the posterior muscle group at thigh level, left thigh, initial encounter: Secondary | ICD-10-CM | POA: Diagnosis not present

## 2019-03-17 DIAGNOSIS — H6123 Impacted cerumen, bilateral: Secondary | ICD-10-CM | POA: Diagnosis not present

## 2019-03-17 DIAGNOSIS — H9209 Otalgia, unspecified ear: Secondary | ICD-10-CM | POA: Diagnosis not present

## 2019-03-22 DIAGNOSIS — M19011 Primary osteoarthritis, right shoulder: Secondary | ICD-10-CM | POA: Diagnosis not present

## 2019-03-24 DIAGNOSIS — I5032 Chronic diastolic (congestive) heart failure: Secondary | ICD-10-CM | POA: Diagnosis not present

## 2019-03-31 ENCOUNTER — Telehealth: Payer: Self-pay

## 2019-03-31 DIAGNOSIS — S72341D Displaced spiral fracture of shaft of right femur, subsequent encounter for closed fracture with routine healing: Secondary | ICD-10-CM | POA: Diagnosis not present

## 2019-03-31 NOTE — Telephone Encounter (Signed)
Called Kimberly Roy. She just wanted to give [place] an update and let us know that Kimberly Roy has decided to obtain help through her long term care claim since she is not as stable as she used to be, needs help bathing/dressing due to her shoulder, etc. The insurance company will reach out when ready to request records, etc. I have advised them to keep [place] posted and let us know if anything is needed.

## 2019-03-31 NOTE — Telephone Encounter (Signed)
Copied from [place] [id number]. Topic: General - Other >> Mar 31, 2019  9:53 AM Nils Flack wrote: Reason for CRM: daughter Bayard Hugger called  she is asking for a call back regarding long term care claim.  Please call [phone]

## 2019-04-07 ENCOUNTER — Encounter: Payer: Self-pay | Admitting: Internal Medicine

## 2019-04-08 ENCOUNTER — Other Ambulatory Visit: Payer: Self-pay

## 2019-04-08 ENCOUNTER — Ambulatory Visit (INDEPENDENT_AMBULATORY_CARE_PROVIDER_SITE_OTHER): Payer: PPO | Admitting: Nurse Practitioner

## 2019-04-08 DIAGNOSIS — M25562 Pain in left knee: Secondary | ICD-10-CM | POA: Diagnosis not present

## 2019-04-08 DIAGNOSIS — E669 Obesity, unspecified: Secondary | ICD-10-CM | POA: Diagnosis not present

## 2019-04-08 DIAGNOSIS — Z96652 Presence of left artificial knee joint: Secondary | ICD-10-CM | POA: Diagnosis not present

## 2019-04-08 MED ORDER — GABAPENTIN 300 MG PO CAPS: 600.0000 mg | ORAL_CAPSULE | Freq: Three times a day (TID) | ORAL | 2 refills | Status: DC

## 2019-04-08 NOTE — Telephone Encounter (Signed)
Per Dr Trena Platt last note, rx was written for gabapentin 300 mg- take 2 capsules PO TID. Confirmed this is what patient is taking. [facility]

## 2019-04-08 NOTE — Telephone Encounter (Signed)
If Dr Manuella Ghazi has been following and she is no longer seeing him, I am ok to prescribe for her.  Need to confirm dose.  Her message states tid.  Our note states bid.  Just need to clarify how taking and then ok to send in rx.

## 2019-04-13 DIAGNOSIS — R079 Chest pain, unspecified: Secondary | ICD-10-CM | POA: Diagnosis not present

## 2019-04-13 DIAGNOSIS — I10 Essential (primary) hypertension: Secondary | ICD-10-CM | POA: Diagnosis not present

## 2019-04-13 DIAGNOSIS — I5032 Chronic diastolic (congestive) heart failure: Secondary | ICD-10-CM | POA: Diagnosis not present

## 2019-04-19 DIAGNOSIS — I10 Essential (primary) hypertension: Secondary | ICD-10-CM | POA: Diagnosis not present

## 2019-04-22 ENCOUNTER — Ambulatory Visit (INDEPENDENT_AMBULATORY_CARE_PROVIDER_SITE_OTHER): Payer: PPO | Admitting: Nurse Practitioner

## 2019-04-22 ENCOUNTER — Telehealth (INDEPENDENT_AMBULATORY_CARE_PROVIDER_SITE_OTHER): Payer: Self-pay | Admitting: Nurse Practitioner

## 2019-04-22 DIAGNOSIS — R1084 Generalized abdominal pain: Secondary | ICD-10-CM

## 2019-04-22 NOTE — Telephone Encounter (Signed)
Per the patient request to give her a call to discuss her post procedure follow-up.  The patient requested not to come in to the office due to COVID-19 concerns.  The patient recently underwent a mesenteric angiogram however during the angiogram it was found that the level of stenosis was not as significant as initially estimated on ultrasound.  I had a long discussion with the patient as to the differences between ultrasound technology versus angiogram and why the angiogram is a much more sensitive test.  Understandably, the patient has some frustration regarding continued abdominal discomfort.  Advised the patient to continue to follow-up with her gastroenterologist to do further work-up of this matter.  She denies any issues with her groin sites.  She endorses some soreness however very little bruising, no oozing or foul-smelling odors.  She denies any fever, chills, nausea, vomiting or diarrhea.  She also states that her leg swelling is doing well at this time.  We will have the patient follow-up in 1 year with noninvasive studies to evaluate her lymphedema otherwise the patient is advised to contact us sooner if she should have issues with controlling her swelling with her lymphedema pump as well as medical grade 1 compression stockings.  Also if she begins to have ulcerations, weeping or concern for cellulitis.

## 2019-04-26 ENCOUNTER — Encounter (INDEPENDENT_AMBULATORY_CARE_PROVIDER_SITE_OTHER): Payer: Self-pay | Admitting: Nurse Practitioner

## 2019-04-26 DIAGNOSIS — M1711 Unilateral primary osteoarthritis, right knee: Secondary | ICD-10-CM | POA: Diagnosis not present

## 2019-04-26 DIAGNOSIS — M25512 Pain in left shoulder: Secondary | ICD-10-CM | POA: Diagnosis not present

## 2019-04-26 DIAGNOSIS — M7061 Trochanteric bursitis, right hip: Secondary | ICD-10-CM | POA: Diagnosis not present

## 2019-04-26 DIAGNOSIS — M25551 Pain in right hip: Secondary | ICD-10-CM | POA: Diagnosis not present

## 2019-04-26 DIAGNOSIS — M7542 Impingement syndrome of left shoulder: Secondary | ICD-10-CM | POA: Diagnosis not present

## 2019-04-26 NOTE — Progress Notes (Signed)
Per the patient request to give her a call to discuss her post procedure follow-up.  The patient requested not to come in to the office due to COVID-19 concerns.  The patient recently underwent a mesenteric angiogram however during the angiogram it was found that the level of stenosis was not as significant as initially estimated on ultrasound.  I had a long discussion with the patient as to the differences between ultrasound technology versus angiogram and why the angiogram is a much more sensitive test.  Understandably, the patient has some frustration regarding continued abdominal discomfort.  Advised the patient to continue to follow-up with her gastroenterologist to do further work-up of this matter.  She denies any issues with her groin sites.  She endorses some soreness however very little bruising, no oozing or foul-smelling odors.  She denies any fever, chills, nausea, vomiting or diarrhea.  She also states that her leg swelling is doing well at this time.  We will have the patient follow-up in 1 year with noninvasive studies to evaluate her lymphedema otherwise the patient is advised to contact us sooner if she should have issues with controlling her swelling with her lymphedema pump as well as medical grade 1 compression stockings.  Also if she begins to have ulcerations, weeping or concern for cellulitis. 

## 2019-05-01 DIAGNOSIS — S72341D Displaced spiral fracture of shaft of right femur, subsequent encounter for closed fracture with routine healing: Secondary | ICD-10-CM | POA: Diagnosis not present

## 2019-05-07 ENCOUNTER — Other Ambulatory Visit: Payer: Self-pay

## 2019-05-07 DIAGNOSIS — D500 Iron deficiency anemia secondary to blood loss (chronic): Secondary | ICD-10-CM

## 2019-05-11 ENCOUNTER — Inpatient Hospital Stay: Payer: PPO | Attending: Internal Medicine

## 2019-05-11 ENCOUNTER — Inpatient Hospital Stay: Payer: PPO | Admitting: Internal Medicine

## 2019-05-11 NOTE — Assessment & Plan Note (Deleted)
1.  Chronic anemia-unclear etiology.  Hemoglobin stable between 10-11.  Asymptomatic.  Saturations 23%; ferritin 47.  Will hold off on Venofer at this time given improvement in bloodwork and that she is asymptomatic. Patient agrees.   # HTN- poorly controlled-discussed regarding compliance with medication checking blood pressure regular basis at home.  Inform PCP of the log.  # DISPOSITION:  # Follow up in 6 months- MD-labs-cbc/cmp/iron studies/ferritin-Dr.B

## 2019-05-11 NOTE — Progress Notes (Deleted)
Kimberly Roy NOTE  Patient Care Team: Einar Pheasant, [name], [name], [name], MD (Otolaryngology)  CHIEF COMPLAINTS/PURPOSE OF CONSULTATION: Anemia  # CHRONIC MILD ANEMIA hb ~11.[Previous Dr.Gittin pt]; [place] 2017- work up [place] [employer]; stool card; Iron/ monoclonal work up- NEGATIVE]   # CHRONIC INTERSTITIAL CYSTITIS/ wheel chair bound  Oncology History   No history exists.     HISTORY OF PRESENTING ILLNESS: pt is a poor historian.  Kimberly Roy 85 y.o.  female  with a history of chronic anemia- is here for a follow up.   Patient denies any blood in urine or blood in stools or black or stools.  Chronic mild to moderate fatigue.  Not new.  Review of Systems  Constitutional: Positive for malaise/fatigue. Negative for chills, diaphoresis and fever.  HENT: Negative for nosebleeds and sore throat.   Eyes: Negative for double vision.  Respiratory: Negative for cough, hemoptysis, sputum production, shortness of breath and wheezing.   Cardiovascular: Negative for chest pain, palpitations, orthopnea and leg swelling.  Gastrointestinal: Negative for abdominal pain, blood in stool, constipation, diarrhea, heartburn, melena, nausea and vomiting.  Genitourinary: Negative for dysuria, frequency and urgency.  Musculoskeletal: Positive for back pain and joint pain.  Skin: Negative.  Negative for itching and rash.  Neurological: Negative for dizziness, tingling, focal weakness, weakness and headaches.  Endo/Heme/Allergies: Does not bruise/bleed easily.  Psychiatric/Behavioral: Negative for depression. The patient is not nervous/anxious and does not have insomnia.      MEDICAL HISTORY:  Past Medical History:  Diagnosis Date  . Anemia   . Anxiety   . Chest pain   . CHF (congestive heart failure) ([place])   . Constipation   . DDD (degenerative disc  disease), cervical   . Depression   . DVT (deep venous thrombosis) ([place])   . Dysphonia   . Dyspnea   . Fatty liver   . Fatty liver   . Headache   . Hyperlipidemia   . Hyperpiesia   . Hypertension   . Hypothyroidism   . Interstitial cystitis   . Left ventricular dysfunction   . Lymphedema   . Nephrolithiasis   . Obstructive sleep apnea   . Osteoarthritis    knees/cervical and lumbar spine  . Pulmonary hypertension ([place])   . Pulmonary nodules    followed by Dr Raul Del  . Pure hypercholesterolemia   . Renal cyst    right    SURGICAL HISTORY: Past Surgical History:  Procedure Laterality Date  . ABDOMINAL HYSTERECTOMY     ovaries left in place  . APPENDECTOMY    . Back Surgeries    . BACK SURGERY    . BREAST REDUCTION SURGERY     3/99  . CARDIAC CATHETERIZATION    . cataracts Bilateral   . CERVICAL SPINE SURGERY    . ESOPHAGEAL MANOMETRY N/A 08/02/2015   Procedure: ESOPHAGEAL MANOMETRY (EM);  Surgeon: Josefine Class, MD;  Location: [facility] ENDOSCOPY;  Service: Endoscopy;  Laterality: N/A;  . ESOPHAGOGASTRODUODENOSCOPY N/A 02/27/2015   Procedure: ESOPHAGOGASTRODUODENOSCOPY (EGD);  Surgeon: Hulen Luster, MD;  Location: [facility] ENDOSCOPY;  Service: Gastroenterology;  Laterality: N/A;  . ESOPHAGOGASTRODUODENOSCOPY (EGD) WITH PROPOFOL N/A 10/30/2018   Procedure: ESOPHAGOGASTRODUODENOSCOPY (EGD) WITH PROPOFOL;  Surgeon: Lollie Sails, MD;  Location: [facility] ENDOSCOPY;  Service: Endoscopy;  Laterality: N/A;  . EXCISIONAL HEMORRHOIDECTOMY    . EYE SURGERY    . FRACTURE SURGERY    .  HEMORRHOID SURGERY    . HIP SURGERY  2013   Right hip surgery  . JOINT REPLACEMENT    . KNEE ARTHROSCOPY     left and right  . ORIF FEMUR FRACTURE Right 08/07/2018   Procedure: OPEN REDUCTION INTERNAL FIXATION (ORIF) DISTAL FEMUR FRACTURE;  Surgeon: Hessie Knows, MD;  Location: [facility] ORS;  Service: Orthopedics;  Laterality: Right;  . REDUCTION MAMMAPLASTY Bilateral YRS AGO  . REPLACEMENT TOTAL KNEE  Bilateral   . rotator cuff surgery     blilateral  . TONSILECTOMY/ADENOIDECTOMY WITH MYRINGOTOMY    . VISCERAL ARTERY INTERVENTION N/A 02/25/2019   Procedure: VISCERAL ARTERY INTERVENTION;  Surgeon: Algernon Huxley, MD;  Location: [place] CV LAB;  Service: Cardiovascular;  Laterality: N/A;    SOCIAL HISTORY: Social History   Socioeconomic History  . Marital status: Married    Spouse name: Not on file  . Number of children: 2  . Years of education: Not on file  . Highest education level: Not on file  Occupational History    Comment: insurance agency  Social Needs  . Financial resource strain: Not very hard  . Food insecurity    Worry: Never true    Inability: Never true  . Transportation needs    Medical: No    Non-medical: No  Tobacco Use  . Smoking status: Never Smoker  . Smokeless tobacco: Never Used  Substance and Sexual Activity  . Alcohol use: No    Alcohol/week: 0.0 standard drinks  . Drug use: Never  . Sexual activity: Not Currently  Lifestyle  . Physical activity    Days per week: 0 days    Minutes per session: Not on file  . Stress: Not on file  Relationships  . Social Herbalist on phone: Patient refused    Gets together: Patient refused    Attends religious service: Patient refused    Active member of club or organization: Patient refused    Attends meetings of clubs or organizations: Patient refused    Relationship status: Patient refused  . Intimate partner violence    Fear of current or ex partner: No    Emotionally abused: No    Physically abused: No    Forced sexual activity: No  Other Topics Concern  . Not on file  Social History Narrative  . Not on file    FAMILY HISTORY: Family History  Problem Relation Age of Onset  . Heart disease Mother   . Stroke Mother   . Hypertension Mother   . Heart disease Father        myocardial infarction age 16  . Breast cancer Neg Hx     ALLERGIES:  is allergic to lyrica [pregabalin];  atarax [hydroxyzine]; dicyclomine; hydroxyzine hcl; levaquin [levofloxacin]; nucynta er [tapentadol hcl er]; oxybutynin; zoloft [sertraline hcl]; biaxin [clarithromycin]; sertraline; sulfa antibiotics; sulfasalazine; tape; and tapentadol.  MEDICATIONS:  Current Outpatient Medications  Medication Sig Dispense Refill  . aluminum-magnesium hydroxide-simethicone (MAALOX) 790-240-97 MG/5ML SUSP Take 15 mLs by mouth 4 (four) times daily -  before meals and at bedtime.     Marland Kitchen amLODipine (NORVASC) 10 MG tablet TAKE 1 TABLET BY MOUTH DAILY 90 tablet 1  . atorvastatin (LIPITOR) 20 MG tablet TAKE ONE TABLET BY MOUTH AT BEDTIME 90 tablet 1  . azelastine (ASTELIN) 0.1 % nasal spray Place 1 spray into both nostrils 2 (two) times daily. Use in each nostril as directed 30 mL 1  . benazepril (LOTENSIN) 40 MG tablet TAKE  1 TABLET BY MOUTH DAILY 90 tablet 1  . busPIRone (BUSPAR) 10 MG tablet Take 10 mg by mouth 2 (two) times daily.     . calcium citrate-vitamin D (CITRACAL+D) 315-200 MG-UNIT tablet Take 1 tablet by mouth daily.    [name] Sodium 30-100 MG CAPS Take 1 capsule by mouth daily.     . cephALEXin (KEFLEX) 250 MG capsule Take 250 mg by mouth daily.    . Cholecalciferol (VITAMIN D3) 1000 UNITS CAPS Take by mouth.    . DULoxetine (CYMBALTA) 60 MG capsule TAKE 1 CAPSULE BY MOUTH EVERY DAY. 30 capsule 3  . furosemide (LASIX) 20 MG tablet TAKE 1 TABLET BY MOUTH EVERY DAY AS NEEDED. (Patient not taking: Reported on 02/25/2019) 90 tablet 0  . gabapentin (NEURONTIN) 300 MG capsule Take 2 capsules (600 mg total) by mouth 3 (three) times daily. 540 capsule 2  . hydrochlorothiazide (MICROZIDE) 12.5 MG capsule Take 12.5 mg by mouth daily.    Marland Kitchen HYDROcodone-acetaminophen (NORCO) 10-325 MG tablet Take 1 tablet by mouth 3 (three) times daily. (Patient not taking: Reported on 02/25/2019) 10 tablet 0  . Inulin (FIBER CHOICE PO) Take 2 tablets by mouth daily. Chew tablets    . levothyroxine (SYNTHROID,  LEVOTHROID) 100 MCG tablet Take 1 tablet (100 mcg total) by mouth daily. 90 tablet 3  . magnesium oxide (MAG-OX) 400 MG tablet Take 1 tablet (400 mg total) by mouth daily. 30 tablet 1  . metoprolol succinate (TOPROL-XL) 50 MG 24 hr tablet Take 2 tablets by mouth in the morning and 1 tablet by mouth in the evening. 270 tablet 1  . morphine (MS CONTIN) 60 MG 12 hr tablet Take 1 tablet (60 mg total) by mouth 2 (two) times daily. 10 tablet 0  . Naldemedine Tosylate (SYMPROIC) 0.2 MG TABS Take 0.2 mg by mouth daily.    Marland Kitchen nystatin cream (MYCOSTATIN) Apply 1 application topically daily as needed for itching.    . polyethylene glycol powder (GLYCOLAX/MIRALAX) 17 GM/SCOOP powder MIX 17 GRAMS AS MARKED ON BOTTLE TOP IN 8 OUNCES OF WATER AND DRINK ONCE A DAY AS DIRECTED. 527 g 0  . Probiotic Product (ALIGN PO) Take by mouth daily.    . RABEprazole (ACIPHEX) 20 MG tablet Take 1 tablet by mouth daily.    Marland Kitchen senna (SENOKOT) 8.6 MG TABS tablet Take 1 tablet by mouth 2 (two) times daily.    . Simethicone (GAS-X PO) Take 2 tablets by mouth 2 (two) times daily.    . sodium chloride (OCEAN) 0.65 % nasal spray Place 1 spray into the nose as needed.    . triamcinolone cream (KENALOG) 0.1 %      No current facility-administered medications for this visit.       Marland Kitchen  PHYSICAL EXAMINATION: ECOG PERFORMANCE STATUS: 0 - Asymptomatic  There were no vitals filed for this visit. There were no vitals filed for this visit.  Physical Exam  Constitutional: She is oriented to person, place, and time and well-developed, well-nourished, and in no distress.  Patient in wheelchair because of chronic arthritic problems.  HENT:  Head: Normocephalic and atraumatic.  Mouth/Throat: Oropharynx is clear and moist. No oropharyngeal exudate.  Eyes: Pupils are equal, round, and reactive to light.  Neck: Normal range of motion. Neck supple.  Cardiovascular: Normal rate and regular rhythm.  Pulmonary/Chest: Effort normal and breath  sounds normal. No respiratory distress. She has no wheezes.  Abdominal: Soft. Bowel sounds are normal. She exhibits no distension and no  mass. There is no abdominal tenderness. There is no rebound and no guarding.  Musculoskeletal: Normal range of motion.        General: Edema present. No tenderness.  Neurological: She is alert and oriented to person, place, and time.  Skin: Skin is warm.  Psychiatric: Affect normal.    LABORATORY DATA:  I have reviewed the data as listed Lab Results  Component Value Date   WBC 6.6 11/03/2018   HGB 10.6 (L) 11/03/2018   HCT 33.0 (L) 11/03/2018   MCV 98.2 11/03/2018   PLT 156 11/03/2018   Recent Labs    08/09/18 0324 08/10/18 0306 09/10/18 1324 10/15/18 1212 02/22/19 1059  NA 137 136 139  --   --   K 3.4* 3.6 4.9  --   --   CL 100 99 102  --   --   CO2 31 34* 31  --   --   GLUCOSE 112* 109* 83  --   --   BUN _0 --  22  CREATININE 0.99 0.92 0.84  --  0.78  CALCIUM 8.0* 8.0* 9.5  --   --   GFRNONAA 52* 57*  --   --  >60  GFRAA >60 >60  --   --  >60  PROT  --   --  6.5 7.2  --   ALBUMIN  --   --  3.5 4.3  --   AST  --   --  12 19  --   ALT  --   --  9 12  --   ALKPHOS  --   --  192* 163*  --   BILITOT  --   --  0.3 0.5  --   BILIDIR  --   --  0.1 0.1  --     RADIOGRAPHIC STUDIES: I have personally reviewed the radiological images as listed and agreed with the findings in the report. No results found.  ASSESSMENT & PLAN:   Anemia due to blood loss, chronic 1.  Chronic anemia-unclear etiology.  Hemoglobin stable between 10-11.  Asymptomatic.  Saturations 23%; ferritin 47.  Will hold off on Venofer at this time given improvement in bloodwork and that she is asymptomatic. Patient agrees.   # HTN- poorly controlled-discussed regarding compliance with medication checking blood pressure regular basis at home.  Inform PCP of the log.  # DISPOSITION:  # Follow up in 6 months- MD-labs-cbc/cmp/iron studies/ferritin-Dr.B        Cammie Sickle, MD 05/11/2019 7:53 AM

## 2019-05-17 ENCOUNTER — Telehealth: Payer: Self-pay | Admitting: Internal Medicine

## 2019-05-17 NOTE — Telephone Encounter (Signed)
For ortho problems, the orthopedist usually call in the abx. (they call in what they want).   Let me know if a problem.

## 2019-05-17 NOTE — Telephone Encounter (Signed)
Patient is needing amoxicillin prior to dental procedure. Are you ok with sending this in?

## 2019-05-17 NOTE — Telephone Encounter (Signed)
Pt is having a root canal on 05-20-2019 and needs to be premedicated and needs abx amoxicillin due to knee replacement. [facility] in [place]. Pt said her dental will not call medication in

## 2019-05-18 NOTE — Telephone Encounter (Signed)
Pt called back in to follow up on request. Advised per PCP response

## 2019-05-24 ENCOUNTER — Other Ambulatory Visit: Payer: Self-pay | Admitting: Internal Medicine

## 2019-05-25 ENCOUNTER — Other Ambulatory Visit: Payer: Self-pay | Admitting: Internal Medicine

## 2019-05-28 ENCOUNTER — Other Ambulatory Visit: Payer: Self-pay

## 2019-05-28 ENCOUNTER — Ambulatory Visit (INDEPENDENT_AMBULATORY_CARE_PROVIDER_SITE_OTHER): Payer: PPO | Admitting: Internal Medicine

## 2019-05-28 DIAGNOSIS — E785 Hyperlipidemia, unspecified: Secondary | ICD-10-CM | POA: Diagnosis not present

## 2019-05-28 DIAGNOSIS — K219 Gastro-esophageal reflux disease without esophagitis: Secondary | ICD-10-CM

## 2019-05-28 DIAGNOSIS — G8929 Other chronic pain: Secondary | ICD-10-CM | POA: Diagnosis not present

## 2019-05-28 DIAGNOSIS — I10 Essential (primary) hypertension: Secondary | ICD-10-CM

## 2019-05-28 DIAGNOSIS — R1084 Generalized abdominal pain: Secondary | ICD-10-CM

## 2019-05-28 DIAGNOSIS — F439 Reaction to severe stress, unspecified: Secondary | ICD-10-CM

## 2019-05-28 DIAGNOSIS — D649 Anemia, unspecified: Secondary | ICD-10-CM | POA: Diagnosis not present

## 2019-05-28 DIAGNOSIS — M5440 Lumbago with sciatica, unspecified side: Secondary | ICD-10-CM

## 2019-05-28 NOTE — Progress Notes (Signed)
Patient ID: Kimberly Roy, female   DOB: [birth year], 85 y.o.   MRN: [id number]   Virtual Visit via telephone Note  This visit type was conducted due to national recommendations for restrictions regarding the COVID-19 pandemic (e.g. social distancing).  This format is felt to be most appropriate for this patient at this time.  All issues noted in this document were discussed and addressed.  No physical exam was performed (except for noted visual exam findings with Video Visits).   I connected with AnnWrenn by telephone and verified that I am speaking with the correct person using two identifiers. Location patient: home Location provider: work  Persons participating in the telephone visit: patient, provider  I discussed the limitations, risks, security and privacy concerns of performing an evaluation and management service by telephone and the availability of in person appointments.  The patient expressed understanding and agreed to proceed.   Reason for visit:  Was scheduled for physical.  Pt preferred not to come in the office.  Preferred telephone visit.    HPI: Recently seeing vascular surgery for evaluation abdominal pain.  W/up unrevealing for significant stenosis.  Still with some discomfort.  Has seen GI.  Desires no further testing or intervention at this time.  Reports her feet/toes feel cold.  Toes hurt at night.  Swelling is better.  Blood pressure varying.  Occasionally elevated.  On metoprolol 141m bid now.  Also on amlodipine, lotensin and lasix.  Increased stress.  Seeing Dr KNicolasa Ducking  Not using cpap and desires not to use.     ROS: See pertinent positives and negatives per HPI.  Past Medical History:  Diagnosis Date  . Anemia   . Anxiety   . Chest pain   . [place]   . Constipation   . DDD (degenerative disc disease), cervical   . Depression   . D[place] (deep [place] thrombosis) ([place]   . Dysphonia   . Dyspnea   . Fatty liver   . Fatty liver   .  Headache   . Hyperlipidemia   . Hyperpiesia   . Hypertension   . Hypothyroidism   . Interstitial cystitis   . Left ventricular dysfunction   . Lymphedema   . Nephrolithiasis   . Obstructive sleep apnea   . Osteoarthritis    knees/cervical and lumbar spine  . Pulmonary hypertension ([place]   . Pulmonary nodules    followed by Dr FRaul Del . Pure hypercholesterolemia   . Renal cyst    right    Past Surgical History:  Procedure Laterality Date  . ABDOMINAL HYSTERECTOMY     ovaries left in place  . APPENDECTOMY    . Back Surgeries    . BACK SURGERY    . BREAST REDUCTION SURGERY     3/99  . CARDIAC CATHETERIZATION    . cataracts Bilateral   . CERVICAL SPINE SURGERY    . ESOPHAGEAL MANOMETRY N/A 08/02/2015   Procedure: ESOPHAGEAL MANOMETRY (EM);  Surgeon: MJosefine Class MD;  [facility];  Service: Endoscopy;  Laterality: N/A;  . ESOPHAGOGASTRODUODENOSCOPY N/A 02/27/2015   Procedure: ESOPHAGOGASTRODUODENOSCOPY (EGD);  Surgeon: PHulen Luster MD;  [facility];  Service: Gastroenterology;  Laterality: N/A;  . ESOPHAGOGASTRODUODENOSCOPY (EGD) WITH PROPOFOL N/A 10/30/2018   Procedure: ESOPHAGOGASTRODUODENOSCOPY (EGD) WITH PROPOFOL;  Surgeon: SLollie Sails MD;  [facility];  Service: Endoscopy;  Laterality: N/A;  . EXCISIONAL HEMORRHOIDECTOMY    . EYE SURGERY    . FRACTURE  SURGERY    . HEMORRHOID SURGERY    . HIP SURGERY  2013   Right hip surgery  . JOINT REPLACEMENT    . KNEE ARTHROSCOPY     left and right  . ORIF FEMUR FRACTURE Right 08/07/2018   Procedure: OPEN REDUCTION INTERNAL FIXATION (ORIF) DISTAL FEMUR FRACTURE;  Surgeon: Hessie Knows, MD;  Location: [facility] ORS;  Service: Orthopedics;  Laterality: Right;  . REDUCTION MAMMAPLASTY Bilateral YRS AGO  . REPLACEMENT TOTAL KNEE Bilateral   . rotator cuff surgery     blilateral  . TONSILECTOMY/ADENOIDECTOMY WITH MYRINGOTOMY    . VISCERAL ARTERY INTERVENTION N/A 02/25/2019    Procedure: VISCERAL ARTERY INTERVENTION;  Surgeon: Algernon Huxley, MD;  Location: [place] CV LAB;  Service: Cardiovascular;  Laterality: N/A;    Family History  Problem Relation Age of Onset  . Heart disease Mother   . Stroke Mother   . Hypertension Mother   . Heart disease Father        myocardial infarction age 106  . Breast cancer Neg Hx     SOCIAL HX: reviewed.    Current Outpatient Medications:  .  aluminum-magnesium hydroxide-simethicone (MAALOX) 403-474-25 MG/5ML SUSP, Take 15 mLs by mouth 4 (four) times daily -  before meals and at bedtime. , Disp: , Rfl:  .  amLODipine (NORVASC) 10 MG tablet, TAKE 1 TABLET BY MOUTH DAILY, Disp: 90 tablet, Rfl: 1 .  atorvastatin (LIPITOR) 20 MG tablet, TAKE ONE TABLET BY MOUTH AT BEDTIME, Disp: 90 tablet, Rfl: 1 .  azelastine (ASTELIN) 0.1 % nasal spray, Place 1 spray into both nostrils 2 (two) times daily. Use in each nostril as directed, Disp: 30 mL, Rfl: 1 .  benazepril (LOTENSIN) 40 MG tablet, TAKE 1 TABLET BY MOUTH DAILY, Disp: 90 tablet, Rfl: 1 .  busPIRone (BUSPAR) 10 MG tablet, Take 10 mg by mouth 2 (two) times daily. , Disp: , Rfl:  .  calcium citrate-vitamin D (CITRACAL+D) 315-200 MG-UNIT tablet, Take 1 tablet by mouth daily., Disp: , Rfl:  .  Casanthranol-Docusate Sodium 30-100 MG CAPS, Take 1 capsule by mouth daily. , Disp: , Rfl:  .  cephALEXin (KEFLEX) 250 MG capsule, Take 250 mg by mouth daily., Disp: , Rfl:  .  Cholecalciferol (VITAMIN D3) 1000 UNITS CAPS, Take by mouth., Disp: , Rfl:  .  DULoxetine (CYMBALTA) 60 MG capsule, TAKE 1 CAPSULE BY MOUTH EVERY DAY., Disp: 30 capsule, Rfl: 3 .  furosemide (LASIX) 20 MG tablet, TAKE 1 TABLET BY MOUTH DAILY AS NEEDED, Disp: 90 tablet, Rfl: 0 .  gabapentin (NEURONTIN) 300 MG capsule, Take 2 capsules (600 mg total) by mouth 3 (three) times daily., Disp: 540 capsule, Rfl: 2 .  hydrochlorothiazide (MICROZIDE) 12.5 MG capsule, Take 12.5 mg by mouth daily., Disp: , Rfl:  .   HYDROcodone-acetaminophen (NORCO) 10-325 MG tablet, Take 1 tablet by mouth 3 (three) times daily. (Patient not taking: Reported on 02/25/2019), Disp: 10 tablet, Rfl: 0 .  Inulin (FIBER CHOICE PO), Take 2 tablets by mouth daily. Chew tablets, Disp: , Rfl:  .  magnesium oxide (MAG-OX) 400 MG tablet, Take 1 tablet (400 mg total) by mouth daily., Disp: 30 tablet, Rfl: 1 .  metoprolol succinate (TOPROL-XL) 50 MG 24 hr tablet, Take 2 tablets by mouth in the morning and 1 tablet by mouth in the evening., Disp: 270 tablet, Rfl: 1 .  morphine (MS CONTIN) 60 MG 12 hr tablet, Take 1 tablet (60 mg total) by mouth 2 (two) times daily.,  Disp: 10 tablet, Rfl: 0 .  Naldemedine Tosylate (SYMPROIC) 0.2 MG TABS, Take 0.2 mg by mouth daily., Disp: , Rfl:  .  nystatin cream (MYCOSTATIN), Apply 1 application topically daily as needed for itching., Disp: , Rfl:  .  polyethylene glycol powder (GLYCOLAX/MIRALAX) 17 GM/SCOOP powder, MIX 17 GRAMS AS MARKED ON BOTTLE TOP IN 8 OUNCES OF WATER AND DRINK ONCE A DAY AS DIRECTED., Disp: 527 g, Rfl: 0 .  Probiotic Product (ALIGN PO), Take by mouth daily., Disp: , Rfl:  .  RABEprazole (ACIPHEX) 20 MG tablet, Take 1 tablet by mouth daily., Disp: , Rfl:  .  senna (SENOKOT) 8.6 MG TABS tablet, Take 1 tablet by mouth 2 (two) times daily., Disp: , Rfl:  .  Simethicone (GAS-X PO), Take 2 tablets by mouth 2 (two) times daily., Disp: , Rfl:  .  sodium chloride (OCEAN) 0.65 % nasal spray, Place 1 spray into the nose as needed., Disp: , Rfl:  .  SYNTHROID 100 MCG tablet, TAKE 1 TABLET BY MOUTH DAILY, Disp: 90 tablet, Rfl: 3 .  triamcinolone cream (KENALOG) 0.1 %, , Disp: , Rfl:   EXAM:  VITALS per patient if applicable: [id number].    GENERAL: alert.  Sounds to be in no acute distress. Answering questions appropriately.    PSYCH/NEURO: pleasant and cooperative, no obvious depression or anxiety, speech and thought processing grossly intact  ASSESSMENT AND PLAN:  Discussed the  following assessment and plan:  Abdominal pain Saw AVVS.  No significant stenosis.  Persistent pain.  Declines any further evaluation.    Anemia Has been followed by hematology.  Recheck cbc.   Back pain Has been followed by ortho and pain clinic.   GERD (gastroesophageal reflux disease) Controlled.    Hyperlipidemia On lipitor.  Low cholesterol diet and exercise. Follow lipid panel and liver function tests.    Hypertension Blood pressure as outlined. Varying pressures.  Continue current medication regimen.  Spot check her pressure.  Follow metabolic panel.    Stress Increased stress. Discussed with her today.  Seeing Dr Nicolasa Ducking.      I discussed the assessment and treatment plan with the patient. The patient was provided an opportunity to ask questions and all were answered. The patient agreed with the plan and demonstrated an understanding of the instructions.   The patient was advised to call back or seek an in-person evaluation if the symptoms worsen or if the condition fails to improve as anticipated.  I provided 25 minutes of non-face-to-face time during this encounter.   Einar Pheasant, MD

## 2019-05-30 ENCOUNTER — Encounter: Payer: Self-pay | Admitting: Internal Medicine

## 2019-05-30 NOTE — Assessment & Plan Note (Signed)
Controlled.  

## 2019-05-30 NOTE — Assessment & Plan Note (Signed)
On lipitor.  Low cholesterol diet and exercise.  Follow lipid panel and liver function tests.   

## 2019-05-30 NOTE — Assessment & Plan Note (Signed)
Increased stress. Discussed with her today.  Seeing Dr Nicolasa Ducking.

## 2019-05-30 NOTE — Assessment & Plan Note (Signed)
Has been followed by ortho and pain clinic.

## 2019-05-30 NOTE — Assessment & Plan Note (Signed)
Saw AVVS.  No significant stenosis.  Persistent pain.  Declines any further evaluation.

## 2019-05-30 NOTE — Assessment & Plan Note (Signed)
Blood pressure as outlined. Varying pressures.  Continue current medication regimen.  Spot check her pressure.  Follow metabolic panel.

## 2019-05-30 NOTE — Assessment & Plan Note (Signed)
Has been followed by hematology.  Recheck cbc.  

## 2019-06-01 DIAGNOSIS — S72341D Displaced spiral fracture of shaft of right femur, subsequent encounter for closed fracture with routine healing: Secondary | ICD-10-CM | POA: Diagnosis not present

## 2019-06-01 DIAGNOSIS — M7542 Impingement syndrome of left shoulder: Secondary | ICD-10-CM | POA: Diagnosis not present

## 2019-06-07 DIAGNOSIS — Z79899 Other long term (current) drug therapy: Secondary | ICD-10-CM | POA: Diagnosis not present

## 2019-06-07 DIAGNOSIS — M519 Unspecified thoracic, thoracolumbar and lumbosacral intervertebral disc disorder: Secondary | ICD-10-CM | POA: Diagnosis not present

## 2019-06-07 DIAGNOSIS — M48061 Spinal stenosis, lumbar region without neurogenic claudication: Secondary | ICD-10-CM | POA: Diagnosis not present

## 2019-06-07 DIAGNOSIS — Q675 Congenital deformity of spine: Secondary | ICD-10-CM | POA: Diagnosis not present

## 2019-06-07 DIAGNOSIS — K5903 Drug induced constipation: Secondary | ICD-10-CM | POA: Diagnosis not present

## 2019-06-07 DIAGNOSIS — S76312A Strain of muscle, fascia and tendon of the posterior muscle group at thigh level, left thigh, initial encounter: Secondary | ICD-10-CM | POA: Diagnosis not present

## 2019-06-07 DIAGNOSIS — M5416 Radiculopathy, lumbar region: Secondary | ICD-10-CM | POA: Diagnosis not present

## 2019-06-08 ENCOUNTER — Ambulatory Visit: Payer: PPO

## 2019-06-17 DIAGNOSIS — F330 Major depressive disorder, recurrent, mild: Secondary | ICD-10-CM | POA: Diagnosis not present

## 2019-06-17 DIAGNOSIS — F5105 Insomnia due to other mental disorder: Secondary | ICD-10-CM | POA: Diagnosis not present

## 2019-06-17 DIAGNOSIS — F419 Anxiety disorder, unspecified: Secondary | ICD-10-CM | POA: Diagnosis not present

## 2019-06-24 ENCOUNTER — Other Ambulatory Visit: Payer: Self-pay | Admitting: Internal Medicine

## 2019-06-28 ENCOUNTER — Other Ambulatory Visit: Payer: Self-pay

## 2019-06-30 ENCOUNTER — Ambulatory Visit
Admission: RE | Admit: 2019-06-30 | Discharge: 2019-06-30 | Disposition: A | Discharge status: Home / Self Care | Payer: PPO | Source: Ambulatory Visit | Attending: Internal Medicine | Admitting: Internal Medicine

## 2019-06-30 ENCOUNTER — Ambulatory Visit: Payer: PPO

## 2019-06-30 ENCOUNTER — Other Ambulatory Visit: Payer: Self-pay

## 2019-06-30 ENCOUNTER — Ambulatory Visit (INDEPENDENT_AMBULATORY_CARE_PROVIDER_SITE_OTHER): Payer: PPO | Admitting: Internal Medicine

## 2019-06-30 ENCOUNTER — Ambulatory Visit
Admission: RE | Admit: 2019-06-30 | Discharge: 2019-06-30 | Disposition: A | Discharge status: Home / Self Care | Payer: PPO | Attending: Internal Medicine | Admitting: Internal Medicine

## 2019-06-30 VITALS — BP 138/78 | HR 65 | Temp 96.4°F | Resp 16

## 2019-06-30 DIAGNOSIS — M5440 Lumbago with sciatica, unspecified side: Secondary | ICD-10-CM

## 2019-06-30 DIAGNOSIS — I10 Essential (primary) hypertension: Secondary | ICD-10-CM | POA: Diagnosis not present

## 2019-06-30 DIAGNOSIS — M25562 Pain in left knee: Secondary | ICD-10-CM | POA: Diagnosis not present

## 2019-06-30 DIAGNOSIS — K219 Gastro-esophageal reflux disease without esophagitis: Secondary | ICD-10-CM

## 2019-06-30 DIAGNOSIS — J9811 Atelectasis: Secondary | ICD-10-CM | POA: Diagnosis not present

## 2019-06-30 DIAGNOSIS — R042 Hemoptysis: Secondary | ICD-10-CM | POA: Insufficient documentation

## 2019-06-30 DIAGNOSIS — D649 Anemia, unspecified: Secondary | ICD-10-CM

## 2019-06-30 DIAGNOSIS — E785 Hyperlipidemia, unspecified: Secondary | ICD-10-CM | POA: Diagnosis not present

## 2019-06-30 DIAGNOSIS — R2681 Unsteadiness on feet: Secondary | ICD-10-CM

## 2019-06-30 DIAGNOSIS — R1084 Generalized abdominal pain: Secondary | ICD-10-CM

## 2019-06-30 DIAGNOSIS — G8929 Other chronic pain: Secondary | ICD-10-CM

## 2019-06-30 DIAGNOSIS — I872 Venous insufficiency (chronic) (peripheral): Secondary | ICD-10-CM | POA: Diagnosis not present

## 2019-06-30 DIAGNOSIS — E039 Hypothyroidism, unspecified: Secondary | ICD-10-CM | POA: Diagnosis not present

## 2019-06-30 LAB — CBC WITH DIFFERENTIAL/PLATELET
Basophils Absolute: 0 10*3/uL (ref 0.0–0.1)
Basophils Relative: 0.4 % (ref 0.0–3.0)
Eosinophils Absolute: 0.2 10*3/uL (ref 0.0–0.7)
Eosinophils Relative: 3.4 % (ref 0.0–5.0)
HCT: 35.2 % — ABNORMAL LOW (ref 36.0–46.0)
Hemoglobin: 11.7 g/dL — ABNORMAL LOW (ref 12.0–15.0)
Lymphocytes Relative: 26.7 % (ref 12.0–46.0)
Lymphs Abs: 1.7 10*3/uL (ref 0.7–4.0)
MCHC: 33.1 g/dL (ref 30.0–36.0)
MCV: 99.4 fl (ref 78.0–100.0)
Monocytes Absolute: 0.6 10*3/uL (ref 0.1–1.0)
Monocytes Relative: 10.2 % (ref 3.0–12.0)
Neutro Abs: 3.7 10*3/uL (ref 1.4–7.7)
Neutrophils Relative %: 59.3 % (ref 43.0–77.0)
Platelets: 182 10*3/uL (ref 150.0–400.0)
RBC: 3.54 Mil/uL — ABNORMAL LOW (ref 3.87–5.11)
RDW: 14.8 % (ref 11.5–15.5)
WBC: 6.3 10*3/uL (ref 4.0–10.5)

## 2019-06-30 LAB — LIPID PANEL
Cholesterol: 125 mg/dL (ref 0–200)
HDL: 51.2 mg/dL (ref >39.00)
LDL Cholesterol: 59 mg/dL (ref 0–99)
NonHDL: 73.3
Total CHOL/HDL Ratio: 2
Triglycerides: 73 mg/dL (ref 0.0–149.0)
VLDL: 14.6 mg/dL (ref 0.0–40.0)

## 2019-06-30 LAB — HEPATIC FUNCTION PANEL
ALT: 11 U/L (ref 0–35)
AST: 16 U/L (ref 0–37)
Albumin: 4.2 g/dL (ref 3.5–5.2)
Alkaline Phosphatase: 77 U/L (ref 39–117)
Bilirubin, Direct: 0.2 mg/dL (ref 0.0–0.3)
Total Bilirubin: 0.6 mg/dL (ref 0.2–1.2)
Total Protein: 6.7 g/dL (ref 6.0–8.3)

## 2019-06-30 LAB — BASIC METABOLIC PANEL
BUN: 21 mg/dL (ref 6–23)
CO2: 34 mEq/L — ABNORMAL HIGH (ref 19–32)
Calcium: 10.2 mg/dL (ref 8.4–10.5)
Chloride: 102 mEq/L (ref 96–112)
Creatinine, Ser: 1.09 mg/dL (ref 0.40–1.20)
GFR: 47.56 mL/min — ABNORMAL LOW (ref >60.00)
Glucose, Bld: 92 mg/dL (ref 70–99)
Potassium: 5.3 mEq/L — ABNORMAL HIGH (ref 3.5–5.1)
Sodium: 141 mEq/L (ref 135–145)

## 2019-06-30 LAB — LIPASE: Lipase: 12 U/L (ref 11.0–59.0)

## 2019-06-30 LAB — AMYLASE: Amylase: 32 U/L (ref 27–131)

## 2019-06-30 NOTE — Progress Notes (Signed)
Patient ID: Kimberly Roy, female   DOB: 08-31-1931, 85 y.o.   MRN: [id number]   Subjective:    Patient ID: Kimberly Roy, female    DOB: June 19, 1931, 85 y.o.   MRN: [id number]  HPI  Patient here for a scheduled follow up.  She is accompanied by a caretaker.  History obtained from both of them.  Having trouble getting around. Having issues with her left knee.  Affecting her gait and balance.  Hard to get up and down.  We discussed home health physical therapy.  She requested ortho referral.  [facility]ed [facility].  No chest pain.  Did notice some cough recently.  Cough recent productive of blood/mucus.  Had that episode approximately one week ago and has not noticed any since. Does notice some sob with exertion.  Stable.  No acid reflux.  Still with persistent abdominal pain.  Has seen GI.  Has had extensive GI w/up.  Had vascular w/up.  All unrevealing.  Discussed referral back to GI.  States she will let me know if she wants to pursue further GI w/up.     Past Medical History:  Diagnosis Date  . Anemia   . Anxiety   . Chest pain   . CHF (congestive heart failure) ([place])   . Constipation   . DDD (degenerative disc disease), cervical   . Depression   . DVT (deep venous thrombosis) ([place])   . Dysphonia   . Dyspnea   . Fatty liver   . Fatty liver   . Headache   . Hyperlipidemia   . Hyperpiesia   . Hypertension   . Hypothyroidism   . Interstitial cystitis   . Left ventricular dysfunction   . Lymphedema   . Nephrolithiasis   . Obstructive sleep apnea   . Osteoarthritis    knees/cervical and lumbar spine  . Pulmonary hypertension ([place])   . Pulmonary nodules    followed by Dr Raul Del  . Pure hypercholesterolemia   . Renal cyst    right   Past Surgical History:  Procedure Laterality Date  . ABDOMINAL HYSTERECTOMY     ovaries left in place  . APPENDECTOMY    . Back Surgeries    . BACK SURGERY    . BREAST REDUCTION SURGERY     3/99  . CARDIAC CATHETERIZATION    . cataracts Bilateral    . CERVICAL SPINE SURGERY    . ESOPHAGEAL MANOMETRY N/A 08/02/2015   Procedure: ESOPHAGEAL MANOMETRY (EM);  Surgeon: Josefine Class, MD;  Location: [facility];  Service: Endoscopy;  Laterality: N/A;  . ESOPHAGOGASTRODUODENOSCOPY N/A 02/27/2015   Procedure: ESOPHAGOGASTRODUODENOSCOPY (EGD);  Surgeon: Hulen Luster, MD;  Location[facility];  Service: Gastroenterology;  Laterality: N/A;  . ESOPHAGOGASTRODUODENOSCOPY (EGD) WITH PROPOFOL N/A 10/30/2018   Procedure: ESOPHAGOGASTRODUODENOSCOPY (EGD) WITH PROPOFOL;  Surgeon: Lollie Sails, MD;  Location: [facility] ENDOSCOPY;  Service: Endoscopy;  Laterality: N/A;  . EXCISIONAL HEMORRHOIDECTOMY    . EYE SURGERY    . FRACTURE SURGERY    . HEMORRHOID SURGERY    . HIP SURGERY  2013   Right hip surgery  . JOINT REPLACEMENT    . KNEE ARTHROSCOPY     left and right  . ORIF FEMUR FRACTURE Right 08/07/2018   Procedure: OPEN REDUCTION INTERNAL FIXATION (ORIF) DISTAL FEMUR FRACTURE;  Surgeon: Hessie Knows, MD;  Location: [facility] ORS;  Service: Orthopedics;  Laterality: Right;  . REDUCTION MAMMAPLASTY Bilateral YRS AGO  . REPLACEMENT TOTAL KNEE Bilateral   .  rotator cuff surgery     blilateral  . TONSILECTOMY/ADENOIDECTOMY WITH MYRINGOTOMY    . VISCERAL ARTERY INTERVENTION N/A 02/25/2019   Procedure: VISCERAL ARTERY INTERVENTION;  Surgeon: Algernon Huxley, MD;  Location: [facility];  Service: Cardiovascular;  Laterality: N/A;   Family History  Problem Relation Age of Onset  . Heart disease Mother   . Stroke Mother   . Hypertension Mother   . Heart disease Father        myocardial infarction age 54  . Breast cancer Neg Hx    Social History   Socioeconomic History  . Marital status: Married    Spouse name: Not on file  . Number of children: 2  . Years of education: Not on file  . Highest education level: Not on file  Occupational History    Comment: insurance agency  Social Needs  . Financial resource strain: Not very hard  .  Food insecurity    Worry: Never true    Inability: Never true  . Transportation needs    Medical: No    Non-medical: No  Tobacco Use  . Smoking status: Never Smoker  . Smokeless tobacco: Never Used  Substance and Sexual Activity  . Alcohol use: No    Alcohol/week: 0.0 standard drinks  . Drug use: Never  . Sexual activity: Not Currently  Lifestyle  . Physical activity    Days per week: 0 days    Minutes per session: Not on file  . Stress: Not on file  Relationships  . Social Herbalist on phone: Patient refused    Gets together: Patient refused    Attends religious service: Patient refused    Active member of club or organization: Patient refused    Attends meetings of clubs or organizations: Patient refused    Relationship status: Patient refused  Other Topics Concern  . Not on file  Social History Narrative  . Not on file    Outpatient Encounter Medications as of 06/30/2019  Medication Sig  . aluminum-magnesium hydroxide-simethicone (MAALOX) 751-025-85 MG/5ML SUSP Take 15 mLs by mouth 4 (four) times daily -  before meals and at bedtime.   Marland Kitchen amLODipine (NORVASC) 10 MG tablet TAKE 1 TABLET BY MOUTH DAILY  . atorvastatin (LIPITOR) 20 MG tablet TAKE ONE TABLET BY MOUTH AT BEDTIME  . azelastine (ASTELIN) 0.1 % nasal spray Place 1 spray into both nostrils 2 (two) times daily. Use in each nostril as directed  . benazepril (LOTENSIN) 40 MG tablet TAKE 1 TABLET BY MOUTH DAILY  . busPIRone (BUSPAR) 10 MG tablet Take 10 mg by mouth 2 (two) times daily.   . calcium citrate-vitamin D (CITRACAL+D) 315-200 MG-UNIT tablet Take 1 tablet by mouth daily.  [name] Sodium 30-100 MG CAPS Take 1 capsule by mouth daily.   . cephALEXin (KEFLEX) 250 MG capsule Take 250 mg by mouth daily.  . Cholecalciferol (VITAMIN D3) 1000 UNITS CAPS Take by mouth.  . DULoxetine (CYMBALTA) 60 MG capsule TAKE 1 CAPSULE BY MOUTH EVERY DAY.  . furosemide (LASIX) 20 MG tablet TAKE 1 TABLET  BY MOUTH DAILY AS NEEDED  . gabapentin (NEURONTIN) 300 MG capsule Take 2 capsules (600 mg total) by mouth 3 (three) times daily.  . hydrochlorothiazide (MICROZIDE) 12.5 MG capsule Take 12.5 mg by mouth daily.  Marland Kitchen HYDROcodone-acetaminophen (NORCO) 10-325 MG tablet Take 1 tablet by mouth 3 (three) times daily. (Patient not taking: Reported on 02/25/2019)  . Inulin (FIBER CHOICE PO) Take 2  tablets by mouth daily. Chew tablets  . magnesium oxide (MAG-OX) 400 MG tablet Take 1 tablet (400 mg total) by mouth daily.  . metoprolol succinate (TOPROL-XL) 100 MG 24 hr tablet TAKE 1 TABLET BY MOUTH TWICE A DAY WITH OR IMMEDIATELY FOLLOWING A MEAL  . metoprolol succinate (TOPROL-XL) 50 MG 24 hr tablet Take 2 tablets by mouth in the morning and 1 tablet by mouth in the evening.  Marland Kitchen morphine (MS CONTIN) 60 MG 12 hr tablet Take 1 tablet (60 mg total) by mouth 2 (two) times daily.  Melynda Ripple Tosylate (SYMPROIC) 0.2 MG TABS Take 0.2 mg by mouth daily.  Marland Kitchen nystatin cream (MYCOSTATIN) Apply 1 application topically daily as needed for itching.  . polyethylene glycol powder (GLYCOLAX/MIRALAX) 17 GM/SCOOP powder MIX 17 GRAMS AS MARKED ON BOTTLE TOP IN 8 OUNCES OF WATER AND DRINK ONCE A DAY AS DIRECTED.  . Probiotic Product (ALIGN PO) Take by mouth daily.  . RABEprazole (ACIPHEX) 20 MG tablet Take 1 tablet by mouth daily.  Marland Kitchen senna (SENOKOT) 8.6 MG TABS tablet Take 1 tablet by mouth 2 (two) times daily.  . Simethicone (GAS-X PO) Take 2 tablets by mouth 2 (two) times daily.  . sodium chloride (OCEAN) 0.65 % nasal spray Place 1 spray into the nose as needed.  Marland Kitchen SYNTHROID 100 MCG tablet TAKE 1 TABLET BY MOUTH DAILY  . triamcinolone cream (KENALOG) 0.1 %    No facility-administered encounter medications on file as of 06/30/2019.    Review of Systems  Constitutional: Positive for fatigue.       Decreased appetite.  Has lost weight.    HENT: Negative for congestion and sinus pressure.   Respiratory: Negative for chest  tightness.        Some sob with exertion.  Stable.  Some cough previously as outlined.    Cardiovascular: Negative for chest pain and palpitations.       Leg swelling -stable.   Gastrointestinal: Positive for abdominal pain. Negative for diarrhea and vomiting.  Genitourinary: Negative for difficulty urinating and dysuria.  Musculoskeletal: Negative for myalgias.       Knee pain.  Left knee - affecting her gait and balance.    Skin: Negative for color change and rash.  Neurological: Negative for dizziness, light-headedness and headaches.  Psychiatric/Behavioral: Negative for agitation and dysphoric mood.       Objective:    Physical Exam Constitutional:      General: She is not in acute distress.    Appearance: Normal appearance.  HENT:     Head: Normocephalic and atraumatic.     Right Ear: External ear normal.     Left Ear: External ear normal.  Eyes:     General: No scleral icterus.       Right eye: No discharge.        Left eye: No discharge.     Conjunctiva/sclera: Conjunctivae normal.  Neck:     Musculoskeletal: Neck supple. No muscular tenderness.     Thyroid: No thyromegaly.  Cardiovascular:     Rate and Rhythm: Normal rate and regular rhythm.  Pulmonary:     Effort: No respiratory distress.     Breath sounds: Normal breath sounds. No wheezing.  Abdominal:     General: Bowel sounds are normal.     Palpations: Abdomen is soft.     Comments: Diffuse tenderness to palpation - minimal .    Musculoskeletal:        General: No tenderness.  Comments: No increased swelling.    Lymphadenopathy:     Cervical: No cervical adenopathy.  Skin:    Findings: No erythema or rash.  Neurological:     Mental Status: She is alert.  Psychiatric:        Mood and Affect: Mood normal.        Behavior: Behavior normal.     BP 138/78   Pulse 65   Temp (!) 96.4 F (35.8 C)   Resp 16   SpO2 98%  Wt Readings from Last 3 Encounters:  02/25/19 182 lb (82.6 kg)  11/11/18 185  lb (83.9 kg)  11/10/18 185 lb (83.9 kg)     Lab Results  Component Value Date   WBC 6.3 06/30/2019   HGB 11.7 (L) 06/30/2019   HCT 35.2 (L) 06/30/2019   PLT 182.0 06/30/2019   GLUCOSE 92 06/30/2019   CHOL 125 06/30/2019   TRIG 73.0 06/30/2019   HDL 51.20 06/30/2019   LDLCALC 59 06/30/2019   ALT 11 06/30/2019   AST 16 06/30/2019   NA 141 06/30/2019   K 5.3 No hemolysis seen (H) 06/30/2019   CL 102 06/30/2019   CREATININE 1.09 06/30/2019   BUN 21 06/30/2019   CO2 34 (H) 06/30/2019   TSH 0.42 06/30/2019   INR 0.89 08/06/2018   HGBA1C 6.0 10/19/2014       Assessment & Plan:   Problem List Items Addressed This Visit    Abdominal pain - Primary    Persistent.  Saw [facility].  No significant stenosis.  Has seen GI.  Extensive GI w/up.  Recheck labs.  Discussed f/u with GI.  She will notify me if desires to pursue any further w/up or evaluation.        Relevant Orders   Amylase (Completed)   Lipase (Completed)   Anemia    Has been followed by hematology.  Recheck cbc.       Back pain    Chronic.  Has been followed by ortho and pain clinic.        Chronic venous insufficiency    Compression hose.  Stable.        GERD (gastroesophageal reflux disease)    Controlled.        Hemoptysis    Had the episode as outlined.  No fever.  Some sob with exertion, but breathing overall stable.  Check cxr.  Further w/up pending results.        Relevant Orders   DG Chest 2 View   DG Chest 2 View (Completed)   Hyperlipidemia    On lipitor. Low cholesterol diet and exercise.  Follow lipid panel and liver function tests.        Hypertension    Blood pressure on recheck improved.  Hold on making adjustments in her medication.  Follow pressures.  Follow metabolic panel.        Hypothyroidism    On thyroid replacement.  Follow tsh.       Left knee pain    Left knee pain causing unsteady gait and balance issues.  Persistent problem for her.  Request referral to [facility] for  further evaluation.        Unsteady gait    Her knee is bothering her more.  Affecting her gait and balance.  Request referral to [facility] to have her knee reevaluated.  Discussed home physical therapy.            Einar Pheasant, MD

## 2019-07-01 DIAGNOSIS — S72341D Displaced spiral fracture of shaft of right femur, subsequent encounter for closed fracture with routine healing: Secondary | ICD-10-CM | POA: Diagnosis not present

## 2019-07-01 LAB — TSH: TSH: 0.42 u[IU]/mL (ref 0.35–4.50)

## 2019-07-01 LAB — FERRITIN: Ferritin: 38.8 ng/mL (ref 10.0–291.0)

## 2019-07-02 ENCOUNTER — Other Ambulatory Visit: Payer: Self-pay | Admitting: Internal Medicine

## 2019-07-02 DIAGNOSIS — E875 Hyperkalemia: Secondary | ICD-10-CM

## 2019-07-02 NOTE — Progress Notes (Signed)
Order placed for f/u lab.

## 2019-07-03 ENCOUNTER — Other Ambulatory Visit: Payer: Self-pay | Admitting: Internal Medicine

## 2019-07-03 DIAGNOSIS — R042 Hemoptysis: Secondary | ICD-10-CM

## 2019-07-03 NOTE — Progress Notes (Signed)
Order placed for pulmonary referral.

## 2019-07-04 ENCOUNTER — Encounter: Payer: Self-pay | Admitting: Internal Medicine

## 2019-07-04 DIAGNOSIS — M25562 Pain in left knee: Secondary | ICD-10-CM | POA: Insufficient documentation

## 2019-07-04 DIAGNOSIS — M25569 Pain in unspecified knee: Secondary | ICD-10-CM | POA: Insufficient documentation

## 2019-07-04 NOTE — Assessment & Plan Note (Signed)
Chronic.  Has been followed by ortho and pain clinic.

## 2019-07-04 NOTE — Assessment & Plan Note (Signed)
Had the episode as outlined.  No fever.  Some sob with exertion, but breathing overall stable.  Check cxr.  Further w/up pending results.

## 2019-07-04 NOTE — Assessment & Plan Note (Signed)
On lipitor. Low cholesterol diet and exercise.  Follow lipid panel and liver function tests.

## 2019-07-04 NOTE — Assessment & Plan Note (Signed)
Compression hose.  Stable.

## 2019-07-04 NOTE — Assessment & Plan Note (Signed)
Controlled.

## 2019-07-04 NOTE — Assessment & Plan Note (Signed)
Her knee is bothering her more.  Affecting her gait and balance.  Request referral to [facility] to have her knee reevaluated.  Discussed home physical therapy.

## 2019-07-04 NOTE — Assessment & Plan Note (Signed)
Persistent.  Saw [facility].  No significant stenosis.  Has seen GI.  Extensive GI w/up.  Recheck labs.  Discussed f/u with GI.  She will notify me if desires to pursue any further w/up or evaluation.

## 2019-07-04 NOTE — Assessment & Plan Note (Signed)
Left knee pain causing unsteady gait and balance issues.  Persistent problem for her.  Request referral to [facility] for further evaluation.

## 2019-07-04 NOTE — Assessment & Plan Note (Signed)
Blood pressure on recheck improved.  Hold on making adjustments in her medication.  Follow pressures.  Follow metabolic panel.

## 2019-07-04 NOTE — Assessment & Plan Note (Signed)
Has been followed by hematology.  Recheck cbc.

## 2019-07-04 NOTE — Assessment & Plan Note (Signed)
On thyroid replacement.  Follow tsh.  

## 2019-07-05 ENCOUNTER — Other Ambulatory Visit
Admission: RE | Admit: 2019-07-05 | Discharge: 2019-07-05 | Disposition: A | Discharge status: Home / Self Care | Payer: PPO | Source: Ambulatory Visit | Attending: Internal Medicine | Admitting: Internal Medicine

## 2019-07-05 DIAGNOSIS — E875 Hyperkalemia: Secondary | ICD-10-CM | POA: Diagnosis not present

## 2019-07-05 LAB — BASIC METABOLIC PANEL
Anion gap: 9 (ref 5–15)
BUN: 18 mg/dL (ref 8–23)
CO2: 30 mmol/L (ref 22–32)
Calcium: 9.5 mg/dL (ref 8.9–10.3)
Chloride: 101 mmol/L (ref 98–111)
Creatinine, Ser: 0.88 mg/dL (ref 0.44–1.00)
GFR calc Af Amer: >60 mL/min (ref >60)
GFR calc non Af Amer: 59 mL/min — ABNORMAL LOW (ref >60)
Glucose, Bld: 131 mg/dL — ABNORMAL HIGH (ref 70–99)
Potassium: 3.9 mmol/L (ref 3.5–5.1)
Sodium: 140 mmol/L (ref 135–145)

## 2019-07-06 ENCOUNTER — Encounter: Payer: Self-pay | Admitting: Internal Medicine

## 2019-07-16 DIAGNOSIS — M76892 Other specified enthesopathies of left lower limb, excluding foot: Secondary | ICD-10-CM | POA: Diagnosis not present

## 2019-07-16 DIAGNOSIS — M25561 Pain in right knee: Secondary | ICD-10-CM | POA: Diagnosis not present

## 2019-07-16 DIAGNOSIS — Z96652 Presence of left artificial knee joint: Secondary | ICD-10-CM | POA: Diagnosis not present

## 2019-07-16 DIAGNOSIS — M76899 Other specified enthesopathies of unspecified lower limb, excluding foot: Secondary | ICD-10-CM | POA: Diagnosis not present

## 2019-07-16 DIAGNOSIS — M1712 Unilateral primary osteoarthritis, left knee: Secondary | ICD-10-CM | POA: Diagnosis not present

## 2019-07-20 DIAGNOSIS — H16223 Keratoconjunctivitis sicca, not specified as Sjogren's, bilateral: Secondary | ICD-10-CM | POA: Diagnosis not present

## 2019-07-23 DIAGNOSIS — G4733 Obstructive sleep apnea (adult) (pediatric): Secondary | ICD-10-CM | POA: Diagnosis not present

## 2019-07-23 DIAGNOSIS — R0602 Shortness of breath: Secondary | ICD-10-CM | POA: Diagnosis not present

## 2019-07-23 DIAGNOSIS — I272 Pulmonary hypertension, unspecified: Secondary | ICD-10-CM | POA: Diagnosis not present

## 2019-08-01 DIAGNOSIS — S72341D Displaced spiral fracture of shaft of right femur, subsequent encounter for closed fracture with routine healing: Secondary | ICD-10-CM | POA: Diagnosis not present

## 2019-08-07 ENCOUNTER — Other Ambulatory Visit: Payer: Self-pay | Admitting: Internal Medicine

## 2019-08-09 ENCOUNTER — Telehealth: Payer: Self-pay

## 2019-08-09 NOTE — Telephone Encounter (Signed)
Copied from [place] [phone]. Topic: General - Other >> Aug 09, 2019  1:23 PM Rainey Pines A wrote: Patient would like a callback from [place] in regards to her medication refill requests. Please advise

## 2019-08-10 NOTE — Telephone Encounter (Signed)
Medication has already been refilled advised patient to let me know if she needs anything.

## 2019-08-17 ENCOUNTER — Telehealth: Payer: Self-pay | Admitting: Internal Medicine

## 2019-08-17 NOTE — Chronic Care Management (AMB) (Signed)
  Chronic Care Management   Note  08/17/2019 Name: Kimberly Roy MRN: [id number] DOB: 1931/03/01  Kimberly Roy is a 85 y.o. year old female who is a primary care patient of Einar Pheasant, MD. I reached out to [name] by phone today in response to a referral sent by Kimberly Roy's health plan.     [name] was given information about Chronic Care Management services today including:  1. CCM service includes personalized support from designated clinical staff supervised by her physician, including individualized plan of care and coordination with other care providers 2. 24/7 contact phone numbers for assistance for urgent and routine care needs. 3. Service will only be billed when office clinical staff spend 20 minutes or more in a month to coordinate care. 4. Only one practitioner may furnish and bill the service in a calendar month. 5. The patient may stop CCM services at any time (effective at the end of the month) by phone call to the office staff. 6. The patient will be responsible for cost sharing (co-pay) of up to 20% of the service fee (after annual deductible is met).  Patient did not agree to enrollment in care management services and does not wish to consider at this time.  Follow up plan: The patient has been provided with contact information for the chronic care management team and has been advised to call with any health related questions or concerns.   [place], [place] [address] Direct Dial: [email or web address]  Website: [email or web address]

## 2019-08-23 ENCOUNTER — Other Ambulatory Visit: Payer: Self-pay | Admitting: Internal Medicine

## 2019-08-24 NOTE — Telephone Encounter (Signed)
Received request for refill on aciphex.  It appears that gastroenterology has been prescribing.  Please confirm with pt - refill from GI.  Let me know if a problem.

## 2019-08-25 ENCOUNTER — Other Ambulatory Visit: Payer: Self-pay | Admitting: Internal Medicine

## 2019-08-26 NOTE — Telephone Encounter (Signed)
Called pt. Advised that this is normally filled by GI. Called Logan at [facility]. Rx request sent to [facility] since she is being followed by them. He is going to let me know if any problems.

## 2019-08-31 DIAGNOSIS — S72341D Displaced spiral fracture of shaft of right femur, subsequent encounter for closed fracture with routine healing: Secondary | ICD-10-CM | POA: Diagnosis not present

## 2019-09-06 DIAGNOSIS — Q675 Congenital deformity of spine: Secondary | ICD-10-CM | POA: Diagnosis not present

## 2019-09-06 DIAGNOSIS — S76312A Strain of muscle, fascia and tendon of the posterior muscle group at thigh level, left thigh, initial encounter: Secondary | ICD-10-CM | POA: Diagnosis not present

## 2019-09-06 DIAGNOSIS — M5416 Radiculopathy, lumbar region: Secondary | ICD-10-CM | POA: Diagnosis not present

## 2019-09-06 DIAGNOSIS — Z79899 Other long term (current) drug therapy: Secondary | ICD-10-CM | POA: Diagnosis not present

## 2019-09-06 DIAGNOSIS — M519 Unspecified thoracic, thoracolumbar and lumbosacral intervertebral disc disorder: Secondary | ICD-10-CM | POA: Diagnosis not present

## 2019-09-06 DIAGNOSIS — K5903 Drug induced constipation: Secondary | ICD-10-CM | POA: Diagnosis not present

## 2019-09-06 DIAGNOSIS — M48061 Spinal stenosis, lumbar region without neurogenic claudication: Secondary | ICD-10-CM | POA: Diagnosis not present

## 2019-09-07 ENCOUNTER — Encounter: Payer: Self-pay | Admitting: Internal Medicine

## 2019-09-07 ENCOUNTER — Ambulatory Visit (INDEPENDENT_AMBULATORY_CARE_PROVIDER_SITE_OTHER): Payer: PPO | Admitting: Internal Medicine

## 2019-09-07 ENCOUNTER — Other Ambulatory Visit: Payer: Self-pay

## 2019-09-07 ENCOUNTER — Telehealth: Payer: Self-pay | Admitting: Internal Medicine

## 2019-09-07 VITALS — BP 138/69 | Ht 62.0 in | Wt 185.0 lb

## 2019-09-07 DIAGNOSIS — E785 Hyperlipidemia, unspecified: Secondary | ICD-10-CM

## 2019-09-07 DIAGNOSIS — D649 Anemia, unspecified: Secondary | ICD-10-CM

## 2019-09-07 DIAGNOSIS — G8929 Other chronic pain: Secondary | ICD-10-CM

## 2019-09-07 DIAGNOSIS — M5440 Lumbago with sciatica, unspecified side: Secondary | ICD-10-CM

## 2019-09-07 DIAGNOSIS — R1084 Generalized abdominal pain: Secondary | ICD-10-CM

## 2019-09-07 DIAGNOSIS — I10 Essential (primary) hypertension: Secondary | ICD-10-CM

## 2019-09-07 DIAGNOSIS — I272 Pulmonary hypertension, unspecified: Secondary | ICD-10-CM | POA: Diagnosis not present

## 2019-09-07 DIAGNOSIS — F439 Reaction to severe stress, unspecified: Secondary | ICD-10-CM | POA: Diagnosis not present

## 2019-09-07 DIAGNOSIS — R6889 Other general symptoms and signs: Secondary | ICD-10-CM | POA: Diagnosis not present

## 2019-09-07 DIAGNOSIS — K219 Gastro-esophageal reflux disease without esophagitis: Secondary | ICD-10-CM

## 2019-09-07 DIAGNOSIS — E039 Hypothyroidism, unspecified: Secondary | ICD-10-CM | POA: Diagnosis not present

## 2019-09-07 DIAGNOSIS — I509 Heart failure, unspecified: Secondary | ICD-10-CM | POA: Diagnosis not present

## 2019-09-07 DIAGNOSIS — I872 Venous insufficiency (chronic) (peripheral): Secondary | ICD-10-CM

## 2019-09-07 NOTE — Telephone Encounter (Signed)
Pt wants to know if she needs a covid test. Had chills over the weekend and now has other symptoms. Please advise.

## 2019-09-07 NOTE — Progress Notes (Signed)
Patient ID: Kimberly Roy, female   DOB: 09/13/1930, 86 y.o.   MRN: [id number]   Virtual Visit via video Note  This visit type was conducted due to national recommendations for restrictions regarding the COVID-19 pandemic (e.g. social distancing).  This format is felt to be most appropriate for this patient at this time.  All issues noted in this document were discussed and addressed.  No physical exam was performed (except for noted visual exam findings with Video Visits).   I connected with [name] by a video enabled telemedicine application and verified that I am speaking with the correct person using two identifiers. Location patient: home Location provider: work  Persons participating in the virtual visit: patient, provider  The limitations, risks, security and privacy concerns of performing an evaluation and management service by video and the availability of in person appointments have been discussed. The patient expressed understanding and agreed to proceed.   Reason for visit: scheduled follow up.   HPI: Previously having persistent left knee issues.  Saw Dr Mack Guise.  Discussed therapy and possibly some other testing, including bone scan.  Wanted to think about options - per note.  She is still having some pain in her feet/ankles/toes.  Has neuropathy.  On gabapentin.  Given dose she is taking, discussed adding alpha lipoic acid, instead of increasing dose more.  Some decreased appetite.  She is eating.  Bowels are ok.  Still with abdomina pain.  Has been worked up extensively.  See previous notes and GI and vascular notes.  No vomiting.  Some increased acid reflux and increased gas.  Discussed aciphex bid.  No chest pain reported.  Breathing stable.  Over the weekend - noticed feeling cold.  No documented fever.  No cough or congestion.  Asked her to monitor.  Blood pressures averaging 130s/50-60s.    ROS: See pertinent positives and negatives per HPI.  Past Medical History:    Diagnosis Date  . Anemia   . Anxiety   . Chest pain   . CHF (congestive heart failure) ([place])   . Constipation   . DDD (degenerative disc disease), cervical   . Depression   . DVT (deep venous thrombosis) ([place])   . Dysphonia   . Dyspnea   . Fatty liver   . Fatty liver   . Headache   . Hyperlipidemia   . Hyperpiesia   . Hypertension   . Hypothyroidism   . Interstitial cystitis   . Left ventricular dysfunction   . Lymphedema   . Nephrolithiasis   . Obstructive sleep apnea   . Osteoarthritis    knees/cervical and lumbar spine  . Pulmonary hypertension ([place])   . Pulmonary nodules    followed by Dr Raul Del  . Pure hypercholesterolemia   . Renal cyst    right    Past Surgical History:  Procedure Laterality Date  . ABDOMINAL HYSTERECTOMY     ovaries left in place  . APPENDECTOMY    . Back Surgeries    . BACK SURGERY    . BREAST REDUCTION SURGERY     3/99  . CARDIAC CATHETERIZATION    . cataracts Bilateral   . CERVICAL SPINE SURGERY    . ESOPHAGEAL MANOMETRY N/A 08/02/2015   Procedure: ESOPHAGEAL MANOMETRY (EM);  Surgeon: Josefine Class, MD;  Location: [facility] ENDOSCOPY;  Service: Endoscopy;  Laterality: N/A;  . ESOPHAGOGASTRODUODENOSCOPY N/A 02/27/2015   Procedure: ESOPHAGOGASTRODUODENOSCOPY (EGD);  Surgeon: Hulen Luster, MD;  Location: [facility] ENDOSCOPY;  Service: Gastroenterology;  Laterality: N/A;  . ESOPHAGOGASTRODUODENOSCOPY (EGD) WITH PROPOFOL N/A 10/30/2018   Procedure: ESOPHAGOGASTRODUODENOSCOPY (EGD) WITH PROPOFOL;  Surgeon: Lollie Sails, MD;  Location: [facility] ENDOSCOPY;  Service: Endoscopy;  Laterality: N/A;  . EXCISIONAL HEMORRHOIDECTOMY    . EYE SURGERY    . FRACTURE SURGERY    . HEMORRHOID SURGERY    . HIP SURGERY  2013   Right hip surgery  . JOINT REPLACEMENT    . KNEE ARTHROSCOPY     left and right  . ORIF FEMUR FRACTURE Right 08/07/2018   Procedure: OPEN REDUCTION INTERNAL FIXATION (ORIF) DISTAL FEMUR FRACTURE;  Surgeon: Hessie Knows, MD;   Location: [facility] ORS;  Service: Orthopedics;  Laterality: Right;  . REDUCTION MAMMAPLASTY Bilateral YRS AGO  . REPLACEMENT TOTAL KNEE Bilateral   . rotator cuff surgery     blilateral  . TONSILECTOMY/ADENOIDECTOMY WITH MYRINGOTOMY    . VISCERAL ARTERY INTERVENTION N/A 02/25/2019   Procedure: VISCERAL ARTERY INTERVENTION;  Surgeon: Algernon Huxley, MD;  Location: [place] CV LAB;  Service: Cardiovascular;  Laterality: N/A;    Family History  Problem Relation Age of Onset  . Heart disease Mother   . Stroke Mother   . Hypertension Mother   . Heart disease Father        myocardial infarction age 53  . Breast cancer Neg Hx     SOCIAL HX: reviewed.    Current Outpatient Medications:  .  metoprolol succinate (TOPROL-XL) 100 MG 24 hr tablet, Take 100 mg by mouth 2 (two) times daily., Disp: , Rfl:  .  aluminum-magnesium hydroxide-simethicone (MAALOX) 179-150-56 MG/5ML SUSP, Take 15 mLs by mouth 4 (four) times daily -  before meals and at bedtime. , Disp: , Rfl:  .  amLODipine (NORVASC) 10 MG tablet, TAKE 1 TABLET BY MOUTH DAILY, Disp: 90 tablet, Rfl: 1 .  atorvastatin (LIPITOR) 20 MG tablet, TAKE 1 TABLET BY MOUTH AT BEDTIME, Disp: 90 tablet, Rfl: 1 .  azelastine (ASTELIN) 0.1 % nasal spray, Place 1 spray into both nostrils 2 (two) times daily. Use in each nostril as directed, Disp: 30 mL, Rfl: 1 .  benazepril (LOTENSIN) 40 MG tablet, TAKE 1 TABLET BY MOUTH DAILY, Disp: 90 tablet, Rfl: 1 .  busPIRone (BUSPAR) 10 MG tablet, Take 10 mg by mouth 2 (two) times daily. , Disp: , Rfl:  .  calcium citrate-vitamin D (CITRACAL+D) 315-200 MG-UNIT tablet, Take 1 tablet by mouth daily., Disp: , Rfl:  .  Casanthranol-Docusate Sodium 30-100 MG CAPS, Take 1 capsule by mouth daily. , Disp: , Rfl:  .  cephALEXin (KEFLEX) 250 MG capsule, Take 250 mg by mouth daily., Disp: , Rfl:  .  Cholecalciferol (VITAMIN D3) 1000 UNITS CAPS, Take by mouth., Disp: , Rfl:  .  DULoxetine (CYMBALTA) 60 MG capsule, TAKE 1  CAPSULE BY MOUTH EVERY DAY., Disp: 30 capsule, Rfl: 3 .  furosemide (LASIX) 20 MG tablet, TAKE 1 TABLET BY MOUTH DAILY AS NEEDED, Disp: 90 tablet, Rfl: 0 .  gabapentin (NEURONTIN) 300 MG capsule, Take 2 capsules (600 mg total) by mouth 3 (three) times daily., Disp: 540 capsule, Rfl: 2 .  hydrochlorothiazide (MICROZIDE) 12.5 MG capsule, Take 12.5 mg by mouth daily., Disp: , Rfl:  .  HYDROcodone-acetaminophen (NORCO) 10-325 MG tablet, Take 1 tablet by mouth 3 (three) times daily. (Patient not taking: Reported on 02/25/2019), Disp: 10 tablet, Rfl: 0 .  Inulin (FIBER CHOICE PO), Take 2 tablets by mouth daily. Chew tablets, Disp: , Rfl:  .  magnesium oxide (  MAG-OX) 400 MG tablet, Take 1 tablet (400 mg total) by mouth daily., Disp: 30 tablet, Rfl: 1 .  morphine (MS CONTIN) 60 MG 12 hr tablet, Take 1 tablet (60 mg total) by mouth 2 (two) times daily., Disp: 10 tablet, Rfl: 0 .  Naldemedine Tosylate (SYMPROIC) 0.2 MG TABS, Take 0.2 mg by mouth daily., Disp: , Rfl:  .  nystatin cream (MYCOSTATIN), Apply 1 application topically daily as needed for itching., Disp: , Rfl:  .  polyethylene glycol powder (GLYCOLAX/MIRALAX) 17 GM/SCOOP powder, MIX 17 GRAMS AS MARKED ON BOTTLE TOP IN 8 OUNCES OF WATER AND DRINK ONCE A DAY AS DIRECTED., Disp: 527 g, Rfl: 0 .  Probiotic Product (ALIGN PO), Take by mouth daily., Disp: , Rfl:  .  RABEprazole (ACIPHEX) 20 MG tablet, Take 1 tablet by mouth daily., Disp: , Rfl:  .  senna (SENOKOT) 8.6 MG TABS tablet, Take 1 tablet by mouth 2 (two) times daily., Disp: , Rfl:  .  Simethicone (GAS-X PO), Take 2 tablets by mouth 2 (two) times daily., Disp: , Rfl:  .  sodium chloride (OCEAN) 0.65 % nasal spray, Place 1 spray into the nose as needed., Disp: , Rfl:  .  SYNTHROID 100 MCG tablet, TAKE 1 TABLET BY MOUTH DAILY, Disp: 90 tablet, Rfl: 3 .  triamcinolone cream (KENALOG) 0.1 %, , Disp: , Rfl:   EXAM:  GENERAL: alert, oriented, appears well and in no acute distress  HEENT:  atraumatic, conjunttiva clear, no obvious abnormalities on inspection of external nose and ears  NECK: normal movements of the head and neck  LUNGS: on inspection no signs of respiratory distress, breathing rate appears normal, no obvious gross SOB, gasping or wheezing  CV: no obvious cyanosis  PSYCH/NEURO: pleasant and cooperative, no obvious depression or anxiety, speech and thought processing grossly intact  ASSESSMENT AND PLAN:  Discussed the following assessment and plan:  Abdominal pain Persistent.  Has had extensive evaluation/work up.  Has seen GI.  Saw [facility].  No significant stenosis.  Discussed further w/up and evaluation.  Will notify me if desires another opinion.    Anemia Has been followed by hematology.  Follow cbc.   CHF (congestive heart failure) Does not appear to be volume overloaded today on exam.  Followed by cardiology.  Follow metabolic panel.  Breathing stable.   Chronic venous insufficiency Continue compression hose.    GERD (gastroesophageal reflux disease) Acid as outlined.  Previous EGD - mild reactive gastropathy.  Discussed aciphex bid.  Notify me if agreeable to referral back to GI, given persistent symptoms despite medication.   Hyperlipidemia On lipitor.  Follow lipid panel and liver function tests.    Hypertension Blood pressure as outlined.  Improved.  Continue current medication regimen.  Follow pressures.  Follow metabolic panel.   Hypothyroidism On thyroid replacement.  Follow tsh.   Pulmonary hypertension ([place]) Followed by cardiology.  Has known sleep apnea.    Stress Increased stress.  Does not feel needs any further intervention.  Follow.    Cold feeling Reported feeling cold this past weekend.  No other symptoms.  No fever.  No chest congestion or cough.  Follow.     Orders Placed This Encounter  Procedures  . Hepatic function panel    Standing Status:   Future    Standing Expiration Date:   09/06/2020  . Lipid panel     Standing Status:   Future    Standing Expiration Date:   09/06/2020  . CBC with  Differential/Platelet    Standing Status:   Future    Standing Expiration Date:   09/06/2020  . Ferritin    Standing Status:   Future    Standing Expiration Date:   09/06/2020  . Basic metabolic panel (future)    Standing Status:   Future    Standing Expiration Date:   09/06/2020     I discussed the assessment and treatment plan with the patient. The patient was provided an opportunity to ask questions and all were answered. The patient agreed with the plan and demonstrated an understanding of the instructions.   The patient was advised to call back or seek an in-person evaluation if the symptoms worsen or if the condition fails to improve as anticipated.   Einar Pheasant, MD

## 2019-09-08 NOTE — Telephone Encounter (Signed)
If symptoms are persistent and she wants to be tested, I am ok to get her scheduled to be tested.  Let me know if I need to do anything.

## 2019-09-08 NOTE — Telephone Encounter (Signed)
Patient stated she is still having chills. Still no fever and no new acute symptoms. Pt stated you discussed her being tested. Pt wanting to know if she should make an appointment to be tested or if she should wait a couple more days?

## 2019-09-08 NOTE — Telephone Encounter (Signed)
Patient aware and will go get tested.

## 2019-09-12 ENCOUNTER — Encounter: Payer: Self-pay | Admitting: Internal Medicine

## 2019-09-12 DIAGNOSIS — R6889 Other general symptoms and signs: Secondary | ICD-10-CM | POA: Insufficient documentation

## 2019-09-12 NOTE — Assessment & Plan Note (Signed)
Does not appear to be volume overloaded today on exam.  Followed by cardiology.  Follow metabolic panel.  Breathing stable.

## 2019-09-12 NOTE — Assessment & Plan Note (Signed)
Has been followed by hematology.  Follow cbc.

## 2019-09-12 NOTE — Assessment & Plan Note (Signed)
On thyroid replacement.  Follow tsh.  

## 2019-09-12 NOTE — Assessment & Plan Note (Signed)
Blood pressure as outlined.  Improved.  Continue current medication regimen.  Follow pressures.  Follow metabolic panel.

## 2019-09-12 NOTE — Assessment & Plan Note (Signed)
Followed by cardiology.  Has known sleep apnea.

## 2019-09-12 NOTE — Assessment & Plan Note (Signed)
Increased stress.  Does not feel needs any further intervention.  Follow.

## 2019-09-12 NOTE — Assessment & Plan Note (Signed)
Continue compression hose.   

## 2019-09-12 NOTE — Assessment & Plan Note (Signed)
Persistent.  Has had extensive evaluation/work up.  Has seen GI.  Saw [facility].  No significant stenosis.  Discussed further w/up and evaluation.  Will notify me if desires another opinion.

## 2019-09-12 NOTE — Assessment & Plan Note (Signed)
Acid as outlined.  Previous EGD - mild reactive gastropathy.  Discussed aciphex bid.  Notify me if agreeable to referral back to GI, given persistent symptoms despite medication.

## 2019-09-12 NOTE — Assessment & Plan Note (Signed)
On lipitor.  Follow lipid panel and liver function tests.

## 2019-09-12 NOTE — Assessment & Plan Note (Signed)
Reported feeling cold this past weekend.  No other symptoms.  No fever.  No chest congestion or cough.  Follow.

## 2019-09-14 DIAGNOSIS — M19011 Primary osteoarthritis, right shoulder: Secondary | ICD-10-CM | POA: Diagnosis not present

## 2019-09-14 DIAGNOSIS — M25511 Pain in right shoulder: Secondary | ICD-10-CM | POA: Diagnosis not present

## 2019-09-18 DIAGNOSIS — Z20822 Contact with and (suspected) exposure to covid-19: Secondary | ICD-10-CM | POA: Diagnosis not present

## 2019-09-18 DIAGNOSIS — Z03818 Encounter for observation for suspected exposure to other biological agents ruled out: Secondary | ICD-10-CM | POA: Diagnosis not present

## 2019-09-21 ENCOUNTER — Telehealth: Payer: Self-pay | Admitting: Internal Medicine

## 2019-09-21 ENCOUNTER — Telehealth: Payer: Self-pay

## 2019-09-21 DIAGNOSIS — F419 Anxiety disorder, unspecified: Secondary | ICD-10-CM | POA: Diagnosis not present

## 2019-09-21 DIAGNOSIS — F330 Major depressive disorder, recurrent, mild: Secondary | ICD-10-CM | POA: Diagnosis not present

## 2019-09-21 DIAGNOSIS — F5105 Insomnia due to other mental disorder: Secondary | ICD-10-CM | POA: Diagnosis not present

## 2019-09-21 DIAGNOSIS — R300 Dysuria: Secondary | ICD-10-CM

## 2019-09-21 NOTE — Telephone Encounter (Signed)
Pt complaining of burning and frequency. Advised to go to [facility] out pt lab to collect urine. Will have to call back to set up virtual with Dr Nicki Reaper this week.

## 2019-09-21 NOTE — Telephone Encounter (Signed)
Orders placed for urine to be done at [facility]

## 2019-09-21 NOTE — Telephone Encounter (Signed)
LMTCB. Need more info

## 2019-09-21 NOTE — Telephone Encounter (Signed)
Pt states that she thinks she has UTI and would like to know what to do. Please advise.

## 2019-09-22 NOTE — Telephone Encounter (Signed)
Pt going to get labs done tomorrow. Appt on Friday.

## 2019-09-23 ENCOUNTER — Inpatient Hospital Stay: Admission: RE | Admit: 2019-09-23 | Payer: PPO | Source: Ambulatory Visit

## 2019-09-23 ENCOUNTER — Other Ambulatory Visit: Payer: PPO

## 2019-09-23 ENCOUNTER — Other Ambulatory Visit
Admission: RE | Admit: 2019-09-23 | Discharge: 2019-09-23 | Disposition: A | Discharge status: Home / Self Care | Payer: PPO | Source: Ambulatory Visit | Attending: Internal Medicine | Admitting: Internal Medicine

## 2019-09-23 ENCOUNTER — Telehealth: Payer: Self-pay | Admitting: Internal Medicine

## 2019-09-23 DIAGNOSIS — R300 Dysuria: Secondary | ICD-10-CM | POA: Diagnosis not present

## 2019-09-23 LAB — URINALYSIS, ROUTINE W REFLEX MICROSCOPIC
Bacteria, UA: NONE SEEN
Bilirubin Urine: NEGATIVE
Glucose, UA: NEGATIVE mg/dL
Hgb urine dipstick: NEGATIVE
Ketones, ur: NEGATIVE mg/dL
Nitrite: NEGATIVE
Protein, ur: 30 mg/dL — AB
Specific Gravity, Urine: 1.019 (ref 1.005–1.030)
pH: 5 (ref 5.0–8.0)

## 2019-09-23 NOTE — Telephone Encounter (Signed)
Patient called today complaining of a very bad sore throat/coughing up thick yellow mucus  and would like something for it. Pt was advised there were no appts available today and that she has an appt with Dr. Nicki Reaper tomorrow. Please advise patient.

## 2019-09-23 NOTE — Telephone Encounter (Signed)
LMTCB. Has appt tomorrow.

## 2019-09-24 ENCOUNTER — Other Ambulatory Visit: Payer: Self-pay

## 2019-09-24 ENCOUNTER — Ambulatory Visit (INDEPENDENT_AMBULATORY_CARE_PROVIDER_SITE_OTHER): Payer: PPO | Admitting: Internal Medicine

## 2019-09-24 ENCOUNTER — Encounter: Payer: Self-pay | Admitting: Internal Medicine

## 2019-09-24 DIAGNOSIS — R05 Cough: Secondary | ICD-10-CM

## 2019-09-24 DIAGNOSIS — K219 Gastro-esophageal reflux disease without esophagitis: Secondary | ICD-10-CM

## 2019-09-24 DIAGNOSIS — R059 Cough, unspecified: Secondary | ICD-10-CM

## 2019-09-24 DIAGNOSIS — R350 Frequency of micturition: Secondary | ICD-10-CM

## 2019-09-24 MED ORDER — CEFDINIR 300 MG PO CAPS: 300.0000 mg | ORAL_CAPSULE | Freq: Two times a day (BID) | ORAL | 0 refills | Status: DC

## 2019-09-24 NOTE — Progress Notes (Signed)
Patient ID: Kimberly Roy, female   DOB: 1931-07-29, 86 y.o.   MRN: [id number]   Virtual Visit via video Note  This visit type was conducted due to national recommendations for restrictions regarding the COVID-19 pandemic (e.g. social distancing).  This format is felt to be most appropriate for this patient at this time.  All issues noted in this document were discussed and addressed.  No physical exam was performed (except for noted visual exam findings with Video Visits).   I connected with [name] by a video enabled telemedicine application and verified that I am speaking with the correct person using two identifiers. Location patient: home Location provider: work Persons participating in the virtual visit: patient, provider  The limitations, risks, security and privacy concerns of performing an evaluation and management service by video and the availability of in person appointments have been discussed.  The patient expressed understanding and agreed to proceed.  Reason for visit: work in appt  HPI: She reports that starting a few days ago, developed a sore throat, cough with thick productive mucus.  Some hoarseness.  No significant head congestion.  Some chest congestion.  No increased sob.  Eating.  No diarrhea or vomiting.  Pulse ox 93-94%.  Eating.  Also has noticed increased urinary frequency.  Some burning - bladder.  No hematuria.  No vaginal symptoms reported.  No bowel change reported.     ROS: See pertinent positives and negatives per HPI.  Past Medical History:  Diagnosis Date  . Anemia   . Anxiety   . Chest pain   . CHF (congestive heart failure) ([place])   . Constipation   . DDD (degenerative disc disease), cervical   . Depression   . DVT (deep venous thrombosis) ([place])   . Dysphonia   . Dyspnea   . Fatty liver   . Fatty liver   . Headache   . Hyperlipidemia   . Hyperpiesia   . Hypertension   . Hypothyroidism   . Interstitial cystitis   . Left ventricular  dysfunction   . Lymphedema   . Nephrolithiasis   . Obstructive sleep apnea   . Osteoarthritis    knees/cervical and lumbar spine  . Pulmonary hypertension ([place])   . Pulmonary nodules    followed by Dr Raul Del  . Pure hypercholesterolemia   . Renal cyst    right    Past Surgical History:  Procedure Laterality Date  . ABDOMINAL HYSTERECTOMY     ovaries left in place  . APPENDECTOMY    . Back Surgeries    . BACK SURGERY    . BREAST REDUCTION SURGERY     3/99  . CARDIAC CATHETERIZATION    . cataracts Bilateral   . CERVICAL SPINE SURGERY    . ESOPHAGEAL MANOMETRY N/A 08/02/2015   Procedure: ESOPHAGEAL MANOMETRY (EM);  Surgeon: Josefine Class, MD;  Location: [facility] ENDOSCOPY;  Service: Endoscopy;  Laterality: N/A;  . ESOPHAGOGASTRODUODENOSCOPY N/A 02/27/2015   Procedure: ESOPHAGOGASTRODUODENOSCOPY (EGD);  Surgeon: Hulen Luster, MD;  Location: [facility] ENDOSCOPY;  Service: Gastroenterology;  Laterality: N/A;  . ESOPHAGOGASTRODUODENOSCOPY (EGD) WITH PROPOFOL N/A 10/30/2018   Procedure: ESOPHAGOGASTRODUODENOSCOPY (EGD) WITH PROPOFOL;  Surgeon: Lollie Sails, MD;  Location: [facility] ENDOSCOPY;  Service: Endoscopy;  Laterality: N/A;  . EXCISIONAL HEMORRHOIDECTOMY    . EYE SURGERY    . FRACTURE SURGERY    . HEMORRHOID SURGERY    . HIP SURGERY  2013   Right hip surgery  . JOINT REPLACEMENT    .  KNEE ARTHROSCOPY     left and right  . ORIF FEMUR FRACTURE Right 08/07/2018   Procedure: OPEN REDUCTION INTERNAL FIXATION (ORIF) DISTAL FEMUR FRACTURE;  Surgeon: Hessie Knows, MD;  Location: [facility] ORS;  Service: Orthopedics;  Laterality: Right;  . REDUCTION MAMMAPLASTY Bilateral YRS AGO  . REPLACEMENT TOTAL KNEE Bilateral   . rotator cuff surgery     blilateral  . TONSILECTOMY/ADENOIDECTOMY WITH MYRINGOTOMY    . VISCERAL ARTERY INTERVENTION N/A 02/25/2019   Procedure: VISCERAL ARTERY INTERVENTION;  Surgeon: Algernon Huxley, MD;  Location: [place] CV LAB;  Service: Cardiovascular;  Laterality:  N/A;    Family History  Problem Relation Age of Onset  . Heart disease Mother   . Stroke Mother   . Hypertension Mother   . Heart disease Father        myocardial infarction age 55  . Breast cancer Neg Hx     SOCIAL HX: reviewed.    Current Outpatient Medications:  .  aluminum-magnesium hydroxide-simethicone (MAALOX) 528-413-24 MG/5ML SUSP, Take 15 mLs by mouth 4 (four) times daily -  before meals and at bedtime. , Disp: , Rfl:  .  amLODipine (NORVASC) 10 MG tablet, TAKE 1 TABLET BY MOUTH DAILY, Disp: 90 tablet, Rfl: 1 .  atorvastatin (LIPITOR) 20 MG tablet, TAKE 1 TABLET BY MOUTH AT BEDTIME, Disp: 90 tablet, Rfl: 1 .  azelastine (ASTELIN) 0.1 % nasal spray, Place 1 spray into both nostrils 2 (two) times daily. Use in each nostril as directed, Disp: 30 mL, Rfl: 1 .  benazepril (LOTENSIN) 40 MG tablet, TAKE 1 TABLET BY MOUTH DAILY, Disp: 90 tablet, Rfl: 1 .  busPIRone (BUSPAR) 10 MG tablet, Take 10 mg by mouth 2 (two) times daily. , Disp: , Rfl:  .  calcium citrate-vitamin D (CITRACAL+D) 315-200 MG-UNIT tablet, Take 1 tablet by mouth daily., Disp: , Rfl:  .  Casanthranol-Docusate Sodium 30-100 MG CAPS, Take 1 capsule by mouth daily. , Disp: , Rfl:  .  cefdinir (OMNICEF) 300 MG capsule, Take 1 capsule (300 mg total) by mouth 2 (two) times daily., Disp: 20 capsule, Rfl: 0 .  cephALEXin (KEFLEX) 250 MG capsule, Take 250 mg by mouth daily., Disp: , Rfl:  .  Cholecalciferol (VITAMIN D3) 1000 UNITS CAPS, Take by mouth., Disp: , Rfl:  .  DULoxetine (CYMBALTA) 60 MG capsule, TAKE 1 CAPSULE BY MOUTH EVERY DAY., Disp: 30 capsule, Rfl: 3 .  furosemide (LASIX) 20 MG tablet, TAKE 1 TABLET BY MOUTH DAILY AS NEEDED, Disp: 90 tablet, Rfl: 0 .  gabapentin (NEURONTIN) 300 MG capsule, Take 2 capsules (600 mg total) by mouth 3 (three) times daily., Disp: 540 capsule, Rfl: 2 .  hydrochlorothiazide (MICROZIDE) 12.5 MG capsule, Take 12.5 mg by mouth daily., Disp: , Rfl:  .  HYDROcodone-acetaminophen (NORCO)  10-325 MG tablet, Take 1 tablet by mouth 3 (three) times daily. (Patient not taking: Reported on 02/25/2019), Disp: 10 tablet, Rfl: 0 .  Inulin (FIBER CHOICE PO), Take 2 tablets by mouth daily. Chew tablets, Disp: , Rfl:  .  magnesium oxide (MAG-OX) 400 MG tablet, Take 1 tablet (400 mg total) by mouth daily., Disp: 30 tablet, Rfl: 1 .  metoprolol succinate (TOPROL-XL) 100 MG 24 hr tablet, Take 100 mg by mouth 2 (two) times daily., Disp: , Rfl:  .  morphine (MS CONTIN) 60 MG 12 hr tablet, Take 1 tablet (60 mg total) by mouth 2 (two) times daily., Disp: 10 tablet, Rfl: 0 .  Naldemedine Tosylate (SYMPROIC) 0.2 MG  TABS, Take 0.2 mg by mouth daily., Disp: , Rfl:  .  nystatin cream (MYCOSTATIN), Apply 1 application topically daily as needed for itching., Disp: , Rfl:  .  polyethylene glycol powder (GLYCOLAX/MIRALAX) 17 GM/SCOOP powder, MIX 17 GRAMS AS MARKED ON BOTTLE TOP IN 8 OUNCES OF WATER AND DRINK ONCE A DAY AS DIRECTED., Disp: 527 g, Rfl: 0 .  Probiotic Product (ALIGN PO), Take by mouth daily., Disp: , Rfl:  .  RABEprazole (ACIPHEX) 20 MG tablet, Take 1 tablet by mouth daily., Disp: , Rfl:  .  senna (SENOKOT) 8.6 MG TABS tablet, Take 1 tablet by mouth 2 (two) times daily., Disp: , Rfl:  .  Simethicone (GAS-X PO), Take 2 tablets by mouth 2 (two) times daily., Disp: , Rfl:  .  sodium chloride (OCEAN) 0.65 % nasal spray, Place 1 spray into the nose as needed., Disp: , Rfl:  .  SYNTHROID 100 MCG tablet, TAKE 1 TABLET BY MOUTH DAILY, Disp: 90 tablet, Rfl: 3 .  triamcinolone cream (KENALOG) 0.1 %, , Disp: , Rfl:   EXAM:  GENERAL: alert, oriented, appears well and in no acute distress  HEENT: atraumatic, conjunttiva clear, no obvious abnormalities on inspection of external nose and ears  NECK: normal movements of the head and neck  LUNGS: on inspection no signs of respiratory distress, breathing rate appears normal, no obvious gross SOB, gasping or wheezing  CV: no obvious cyanosis  PSYCH/NEURO:  pleasant and cooperative, no obvious depression or anxiety, speech and thought processing grossly intact  ASSESSMENT AND PLAN:  Discussed the following assessment and plan:  Cough Increased chest congestion and cough productive of colored mucus.  Will treat for URI.  Treat with omnicef as directed.  Mucinex/robitussin as directed.  Steroid nasal spray and saline nasal spray as directed.  Rest.  Fluids.  Follow.  Discussed recent possible exposure to covid.  Recent test negative.  Discussed with these symptoms, retesting.  She will try to get back for retesting. Follow symptoms closely.  Continue quarantine.  Call with update regarding symptoms.    Urinary frequency Dysuria and urinary frequency as outlined.  Trace leukocytes.  Await culture results.  omnicef should cover.  Stay hydrated.  Follow.    GERD (gastroesophageal reflux disease) No increased acid reflux reported.  Follow.    No orders of the defined types were placed in this encounter.   Meds ordered this encounter  Medications  . cefdinir (OMNICEF) 300 MG capsule    Sig: Take 1 capsule (300 mg total) by mouth 2 (two) times daily.    Dispense:  20 capsule    Refill:  0     I discussed the assessment and treatment plan with the patient. The patient was provided an opportunity to ask questions and all were answered. The patient agreed with the plan and demonstrated an understanding of the instructions.   The patient was advised to call back or seek an in-person evaluation if the symptoms worsen or if the condition fails to improve as anticipated.   Einar Pheasant, MD

## 2019-09-25 LAB — URINE CULTURE: Culture: NO GROWTH

## 2019-09-26 ENCOUNTER — Encounter: Payer: Self-pay | Admitting: Internal Medicine

## 2019-09-26 DIAGNOSIS — R350 Frequency of micturition: Secondary | ICD-10-CM | POA: Insufficient documentation

## 2019-09-26 NOTE — Assessment & Plan Note (Signed)
Increased chest congestion and cough productive of colored mucus.  Will treat for URI.  Treat with omnicef as directed.  Mucinex/robitussin as directed.  Steroid nasal spray and saline nasal spray as directed.  Rest.  Fluids.  Follow.  Discussed recent possible exposure to covid.  Recent test negative.  Discussed with these symptoms, retesting.  She will try to get back for retesting. Follow symptoms closely.  Continue quarantine.  Call with update regarding symptoms.

## 2019-09-26 NOTE — Assessment & Plan Note (Signed)
No increased acid reflux reported.  Follow.

## 2019-09-26 NOTE — Assessment & Plan Note (Signed)
Dysuria and urinary frequency as outlined.  Trace leukocytes.  Await culture results.  omnicef should cover.  Stay hydrated.  Follow.

## 2019-09-27 ENCOUNTER — Encounter: Payer: Self-pay | Admitting: Internal Medicine

## 2019-09-27 MED ORDER — NYSTATIN 100000 UNIT/GM EX CREA: 1.0000 "application " | TOPICAL_CREAM | Freq: Every day | CUTANEOUS | 0 refills | Status: DC | PRN

## 2019-09-27 NOTE — Telephone Encounter (Signed)
Pt is not feeling well from the antibiotic.Current  Antibiotic is making patient deathly sick. Please advise.

## 2019-09-27 NOTE — Telephone Encounter (Signed)
Please see MyChart note.

## 2019-09-27 NOTE — Telephone Encounter (Signed)
Please call and get more information about how she is doing now and update from what happened over the weekend. (this message came through when we were not here and I am just now seeing).  She was having cough/congestion and urinary symptoms.  Thanks

## 2019-09-27 NOTE — Telephone Encounter (Signed)
I sent the Nystatin patient says he has no other symptoms but burning at urination. FYI

## 2019-09-27 NOTE — Telephone Encounter (Signed)
See previous sent to provider.

## 2019-09-27 NOTE — Telephone Encounter (Signed)
Spoke to [place].  Burning with urination - concern with skin irritation - urine touch.  Culture negative.  Pulmonary symptoms resolved.  nystatin cream.  Wants to hold on collecting another urine sample.  Follow.  Call with update.

## 2019-09-27 NOTE — Telephone Encounter (Signed)
See other my chart message.  Need to know how she is doing now.  If worsening symptoms, not eating, etc - ER evaluation.  Caretaker had covid.

## 2019-09-27 NOTE — Telephone Encounter (Signed)
Urine culture from 09/23/19 was negative for infection.  Can give another urine sample and can see if positive -   Also, can try nystatin cream apply to affected area bid to see if this helps.  If worsening symptoms - to acute care for evaluation.

## 2019-09-27 NOTE — Telephone Encounter (Signed)
Patient says she is sure she has UTI burning really bad, medication made her really sick stopped taking it but still burning really bad says she needs something for UTI, she says he does not have  any respiratory symptoms.  Patient asking for new ABX for UTI.  Denies nausea or vomiting now denies any respiratory symptoms just burning bad.

## 2019-09-28 ENCOUNTER — Other Ambulatory Visit: Payer: PPO

## 2019-09-28 ENCOUNTER — Ambulatory Visit: Payer: PPO | Attending: Internal Medicine

## 2019-09-28 DIAGNOSIS — Z20822 Contact with and (suspected) exposure to covid-19: Secondary | ICD-10-CM | POA: Diagnosis not present

## 2019-09-29 LAB — NOVEL CORONAVIRUS, NAA: SARS-CoV-2, NAA: DETECTED — AB

## 2019-09-30 ENCOUNTER — Telehealth: Payer: Self-pay | Admitting: Adult Health

## 2019-09-30 NOTE — Telephone Encounter (Signed)
Called patient to discuss COVID-19 infection, patient is in high risk category, to discuss possible qualification for monoclonal antibody infusion.  Was unable to reach.  Left message on machine to return call at infusion center.   Charnise Lovan NP-C  [facility]   09/30/2019

## 2019-10-03 ENCOUNTER — Encounter: Payer: Self-pay | Admitting: Internal Medicine

## 2019-10-04 ENCOUNTER — Telehealth: Payer: Self-pay | Admitting: Internal Medicine

## 2019-10-04 ENCOUNTER — Telehealth: Payer: Self-pay | Admitting: Lab

## 2019-10-04 ENCOUNTER — Ambulatory Visit (INDEPENDENT_AMBULATORY_CARE_PROVIDER_SITE_OTHER): Payer: PPO | Admitting: Internal Medicine

## 2019-10-04 ENCOUNTER — Telehealth: Payer: Self-pay | Admitting: Family

## 2019-10-04 ENCOUNTER — Encounter: Payer: Self-pay | Admitting: Internal Medicine

## 2019-10-04 ENCOUNTER — Other Ambulatory Visit: Payer: Self-pay

## 2019-10-04 VITALS — Ht 62.0 in | Wt 185.0 lb

## 2019-10-04 DIAGNOSIS — R05 Cough: Secondary | ICD-10-CM

## 2019-10-04 DIAGNOSIS — R059 Cough, unspecified: Secondary | ICD-10-CM

## 2019-10-04 DIAGNOSIS — F439 Reaction to severe stress, unspecified: Secondary | ICD-10-CM | POA: Diagnosis not present

## 2019-10-04 DIAGNOSIS — U071 COVID-19: Secondary | ICD-10-CM | POA: Diagnosis not present

## 2019-10-04 MED ORDER — PREDNISONE 10 MG PO TABS: ORAL_TABLET | ORAL | 0 refills | Status: DC

## 2019-10-04 NOTE — Telephone Encounter (Signed)
FYI Kimberly Roy and Kimberly Roy,  This is a Kimberly Roy 86 yo patient who is seeing Dr Aundra Dubin tomorrow for COVID positive tested 09/28/19. Her test was via [facility] however she was not contacted for the option for monoclonal antibodies.   Had a vv with patient's daughterNeoma Laming Roy) today who cares for her 51 year old mother who was present in video.   Ms Kimberly Roy is pt of Dr Kimberly Roy and seeing Kimberly Roy tomorrow due to covid.   Daughter states she is coughing. No fever,  Wheezing. 'some SOB' per Ms Kimberly Roy.  Sa02 95 without her oxygen as it was checked while I was on the phone.   Since I didn't have a visit with her, I didn't want to go further with asking questions. Ms Kimberly Roy primarily wanted information and to be called regarding the monoclonal antibodies and WILL KEEP appt with Dr Aundra Dubin. Mr Kimberly Roy was more worried about her daughter how had an appt with today. I am working on getting daughter, Kimberly Roy for monoclonal asap.

## 2019-10-04 NOTE — Telephone Encounter (Signed)
No appointment with you canceled .

## 2019-10-04 NOTE — Telephone Encounter (Signed)
Scheduled Virtual for today at 12 .

## 2019-10-04 NOTE — Telephone Encounter (Signed)
I have placed the order for cxr (to be performed at [facility]).  I sent my chart message.  Please make sure that daughter and Ms Pavlovich aware.  Thanks    Dr Nicki Reaper

## 2019-10-04 NOTE — Telephone Encounter (Signed)
See note.  Needs to be seen as we discussed.

## 2019-10-04 NOTE — Progress Notes (Signed)
Patient ID: Kimberly Roy, female   DOB: May 26, 1931, 86 y.o.   MRN: [id number]   Virtual Visit via video Note  This visit type was conducted due to national recommendations for restrictions regarding the COVID-19 pandemic (e.g. social distancing).  This format is felt to be most appropriate for this patient at this time.  All issues noted in this document were discussed and addressed.  No physical exam was performed (except for noted visual exam findings with Video Visits).   I connected with [name] by a video enabled telemedicine application and verified that I am speaking with the correct person using two identifiers. Location patient: home Location provider: work  Persons participating in the virtual visit: patient, provider  The limitations, risks, security and privacy concerns of performing an evaluation and management service by video and the availability of in person appointments have been discussed.  The patient expressed understanding and agreed to proceed.   Reason for visit: work in appt.   HPI: [name] ws initially seen on 09/24/19 with sore throat and cough.  Also reported some burning with urination.  covid test was negative.  She was placed on omincef to cover for presumed UTI and to treat URI.  Received a message the following day stating that she had an episode of emesis after taking the abx.  Follow up regarding her symptoms, she reported 09/25/19 that she just needed something for the burning and was having no respiratory symptoms.  Treated for yeast infection when urine culture returned negative for infection.    Worked in today with increased cough and congestion.  Her husband is in [facility] with covid.  Her daughter has covid and Ms Grounds tested positive 09/27/19.  On questioning her today, she reports she has had cough and congestion for the last 10 days.  (unsure).  Increased congestion - right nostril and head congestion.  Has to clear her throat.  No sore  throat.  No fever.  Some productive mucus from her nose.  Some blood tinge nasal mucus - nose feels dry.  Some increased chest congestion and increased cough.  Feels tight in her chest.  Some minimal sob with exertion.  Has oxygen at home that she uses at night.  Oxygen saturation 95%.  No vomiting.  She is eating.  Took one dose of robitussin.  Using flonase.  No diarrhea.     ROS: See pertinent positives and negatives per HPI.  Past Medical History:  Diagnosis Date  . Anemia   . Anxiety   . Chest pain   . CHF (congestive heart failure) ([place])   . Constipation   . DDD (degenerative disc disease), cervical   . Depression   . DVT (deep venous thrombosis) ([place])   . Dysphonia   . Dyspnea   . Fatty liver   . Fatty liver   . Headache   . Hyperlipidemia   . Hyperpiesia   . Hypertension   . Hypothyroidism   . Interstitial cystitis   . Left ventricular dysfunction   . Lymphedema   . Nephrolithiasis   . Obstructive sleep apnea   . Osteoarthritis    knees/cervical and lumbar spine  . Pulmonary hypertension ([place])   . Pulmonary nodules    followed by Dr Raul Del  . Pure hypercholesterolemia   . Renal cyst    right    Past Surgical History:  Procedure Laterality Date  . ABDOMINAL HYSTERECTOMY     ovaries left in place  .  APPENDECTOMY    . Back Surgeries    . BACK SURGERY    . BREAST REDUCTION SURGERY     3/99  . CARDIAC CATHETERIZATION    . cataracts Bilateral   . CERVICAL SPINE SURGERY    . ESOPHAGEAL MANOMETRY N/A 08/02/2015   Procedure: ESOPHAGEAL MANOMETRY (EM);  Surgeon: Josefine Class, MD;  Location[facility] ENDOSCOPY;  Service: Endoscopy;  Laterality: N/A;  . ESOPHAGOGASTRODUODENOSCOPY N/A 02/27/2015   Procedure: ESOPHAGOGASTRODUODENOSCOPY (EGD);  Surgeon: Hulen Luster, MD;  Location: [facility] ENDOSCOPY;  Service: Gastroenterology;  Laterality: N/A;  . ESOPHAGOGASTRODUODENOSCOPY (EGD) WITH PROPOFOL N/A 10/30/2018   Procedure: ESOPHAGOGASTRODUODENOSCOPY (EGD) WITH PROPOFOL;   Surgeon: Lollie Sails, MD;  Location: [facility] ENDOSCOPY;  Service: Endoscopy;  Laterality: N/A;  . EXCISIONAL HEMORRHOIDECTOMY    . EYE SURGERY    . FRACTURE SURGERY    . HEMORRHOID SURGERY    . HIP SURGERY  2013   Right hip surgery  . JOINT REPLACEMENT    . KNEE ARTHROSCOPY     left and right  . ORIF FEMUR FRACTURE Right 08/07/2018   Procedure: OPEN REDUCTION INTERNAL FIXATION (ORIF) DISTAL FEMUR FRACTURE;  Surgeon: Hessie Knows, MD;  Location: [facility] ORS;  Service: Orthopedics;  Laterality: Right;  . REDUCTION MAMMAPLASTY Bilateral YRS AGO  . REPLACEMENT TOTAL KNEE Bilateral   . rotator cuff surgery     blilateral  . TONSILECTOMY/ADENOIDECTOMY WITH MYRINGOTOMY    . VISCERAL ARTERY INTERVENTION N/A 02/25/2019   Procedure: VISCERAL ARTERY INTERVENTION;  Surgeon: Algernon Huxley, MD;  Location: [facility];  Service: Cardiovascular;  Laterality: N/A;    Family History  Problem Relation Age of Onset  . Heart disease Mother   . Stroke Mother   . Hypertension Mother   . Heart disease Father        myocardial infarction age 56  . Breast cancer Neg Hx     SOCIAL HX: reviewed.    Current Outpatient Medications:  .  aluminum-magnesium hydroxide-simethicone (MAALOX) 099-833-82 MG/5ML SUSP, Take 15 mLs by mouth 4 (four) times daily -  before meals and at bedtime. , Disp: , Rfl:  .  amLODipine (NORVASC) 10 MG tablet, TAKE 1 TABLET BY MOUTH DAILY, Disp: 90 tablet, Rfl: 1 .  atorvastatin (LIPITOR) 20 MG tablet, TAKE 1 TABLET BY MOUTH AT BEDTIME, Disp: 90 tablet, Rfl: 1 .  azelastine (ASTELIN) 0.1 % nasal spray, Place 1 spray into both nostrils 2 (two) times daily. Use in each nostril as directed, Disp: 30 mL, Rfl: 1 .  benazepril (LOTENSIN) 40 MG tablet, TAKE 1 TABLET BY MOUTH DAILY, Disp: 90 tablet, Rfl: 1 .  busPIRone (BUSPAR) 10 MG tablet, Take 10 mg by mouth 2 (two) times daily. , Disp: , Rfl:  .  calcium citrate-vitamin D (CITRACAL+D) 315-200 MG-UNIT tablet, Take 1 tablet by  mouth daily., Disp: , Rfl:  .  Casanthranol-Docusate Sodium 30-100 MG CAPS, Take 1 capsule by mouth daily. , Disp: , Rfl:  .  cefdinir (OMNICEF) 300 MG capsule, Take 1 capsule (300 mg total) by mouth 2 (two) times daily., Disp: 20 capsule, Rfl: 0 .  cephALEXin (KEFLEX) 250 MG capsule, Take 250 mg by mouth daily., Disp: , Rfl:  .  Cholecalciferol (VITAMIN D3) 1000 UNITS CAPS, Take by mouth., Disp: , Rfl:  .  DULoxetine (CYMBALTA) 60 MG capsule, TAKE 1 CAPSULE BY MOUTH EVERY DAY., Disp: 30 capsule, Rfl: 3 .  furosemide (LASIX) 20 MG tablet, TAKE 1 TABLET BY MOUTH DAILY AS  NEEDED, Disp: 90 tablet, Rfl: 0 .  gabapentin (NEURONTIN) 300 MG capsule, Take 2 capsules (600 mg total) by mouth 3 (three) times daily., Disp: 540 capsule, Rfl: 2 .  hydrochlorothiazide (MICROZIDE) 12.5 MG capsule, Take 12.5 mg by mouth daily., Disp: , Rfl:  .  HYDROcodone-acetaminophen (NORCO) 10-325 MG tablet, Take 1 tablet by mouth 3 (three) times daily., Disp: 10 tablet, Rfl: 0 .  Inulin (FIBER CHOICE PO), Take 2 tablets by mouth daily. Chew tablets, Disp: , Rfl:  .  magnesium oxide (MAG-OX) 400 MG tablet, Take 1 tablet (400 mg total) by mouth daily., Disp: 30 tablet, Rfl: 1 .  metoprolol succinate (TOPROL-XL) 100 MG 24 hr tablet, Take 100 mg by mouth 2 (two) times daily., Disp: , Rfl:  .  morphine (MS CONTIN) 60 MG 12 hr tablet, Take 1 tablet (60 mg total) by mouth 2 (two) times daily., Disp: 10 tablet, Rfl: 0 .  Naldemedine Tosylate (SYMPROIC) 0.2 MG TABS, Take 0.2 mg by mouth daily., Disp: , Rfl:  .  nystatin cream (MYCOSTATIN), Apply 1 application topically daily as needed., Disp: 30 g, Rfl: 0 .  polyethylene glycol powder (GLYCOLAX/MIRALAX) 17 GM/SCOOP powder, MIX 17 GRAMS AS MARKED ON BOTTLE TOP IN 8 OUNCES OF WATER AND DRINK ONCE A DAY AS DIRECTED., Disp: 527 g, Rfl: 0 .  Probiotic Product (ALIGN PO), Take by mouth daily., Disp: , Rfl:  .  RABEprazole (ACIPHEX) 20 MG tablet, Take 1 tablet by mouth daily., Disp: , Rfl:    .  senna (SENOKOT) 8.6 MG TABS tablet, Take 1 tablet by mouth 2 (two) times daily., Disp: , Rfl:  .  Simethicone (GAS-X PO), Take 2 tablets by mouth 2 (two) times daily., Disp: , Rfl:  .  sodium chloride (OCEAN) 0.65 % nasal spray, Place 1 spray into the nose as needed., Disp: , Rfl:  .  SYNTHROID 100 MCG tablet, TAKE 1 TABLET BY MOUTH DAILY, Disp: 90 tablet, Rfl: 3 .  triamcinolone cream (KENALOG) 0.1 %, , Disp: , Rfl:  .  doxycycline (VIBRA-TABS) 100 MG tablet, Take 1 tablet (100 mg total) by mouth 2 (two) times daily., Disp: 14 tablet, Rfl: 0 .  predniSONE (DELTASONE) 10 MG tablet, Take 4 tablets x 1 day and then decrease by 1/2 tablet per day until down to zero mg., Disp: 18 tablet, Rfl: 0  EXAM:  GENERAL: alert, oriented, appears well and in no acute distress  HEENT: atraumatic, conjunttiva clear, no obvious abnormalities on inspection of external nose and ears  NECK: normal movements of the head and neck  LUNGS: on inspection no signs of respiratory distress, breathing rate appears normal, no obvious gross SOB, gasping or wheezing  CV: no obvious cyanosis  PSYCH/NEURO: pleasant and cooperative, no obvious depression or anxiety, speech and thought processing grossly intact  ASSESSMENT AND PLAN:  Discussed the following assessment and plan:  COVID-19 virus detected Tested positive for covid 09/27/19.  Symptoms as outlined.  Reports head and chest congestion with associated cough.  No significant sob.  Treat with steroid nasal spray and saline nasal spray as directed.  Mucinex/robitussin as directed.  Prednisone taper.  Follow closely.  Discussed cxr. Call with update.    Stress Increased stress.  Overall appears to be handling things relatively well.  Follow.     Orders Placed This Encounter  Procedures  . DG Chest 2 View    Standing Status:   Future    Number of Occurrences:   1  Standing Expiration Date:   12/01/2020    Order Specific Question:   Reason for Exam (SYMPTOM   OR DIAGNOSIS REQUIRED)    Answer:   cough and congestion.  - covid positive.    Order Specific Question:   Preferred imaging location?    Answer:   [facility]    Order Specific Question:   Radiology Contrast Protocol - do NOT remove file path    Answer:   [email or web address]    Meds ordered this encounter  Medications  . predniSONE (DELTASONE) 10 MG tablet    Sig: Take 4 tablets x 1 day and then decrease by 1/2 tablet per day until down to zero mg.    Dispense:  18 tablet    Refill:  0     I discussed the assessment and treatment plan with the patient. The patient was provided an opportunity to ask questions and all were answered. The patient agreed with the plan and demonstrated an understanding of the instructions.   The patient was advised to call back or seek an in-person evaluation if the symptoms worsen or if the condition fails to improve as anticipated.    Einar Pheasant, MD

## 2019-10-04 NOTE — Telephone Encounter (Signed)
Called Pt back to tell her that an X-ray order has been put in for her daughter.  Pt wants an X-ray order for herself as well put in.

## 2019-10-04 NOTE — Telephone Encounter (Signed)
As discussed, ok to schedule for appt today.

## 2019-10-04 NOTE — Telephone Encounter (Signed)
She has appt 2/1 with Dr. Nicki Reaper and 2/2 with me?  Does she need both?  Thanks [employer]

## 2019-10-04 NOTE — Telephone Encounter (Signed)
Patient says she has mucous in right nostril yellow thick mucous, said she is feeling better but her daughter is worse per patient. Kimberly Roy has 02 at home and her pulse ox reading is 96. Patient speaking in full sentences , no audible wheezing heard. Patient wants virtual with PCP.

## 2019-10-05 ENCOUNTER — Ambulatory Visit
Admission: RE | Admit: 2019-10-05 | Discharge: 2019-10-05 | Disposition: A | Discharge status: Home / Self Care | Payer: PPO | Source: Ambulatory Visit | Attending: Internal Medicine | Admitting: Internal Medicine

## 2019-10-05 ENCOUNTER — Ambulatory Visit: Payer: PPO | Admitting: Internal Medicine

## 2019-10-05 DIAGNOSIS — R059 Cough, unspecified: Secondary | ICD-10-CM

## 2019-10-05 DIAGNOSIS — R05 Cough: Secondary | ICD-10-CM | POA: Diagnosis not present

## 2019-10-05 DIAGNOSIS — U071 COVID-19: Secondary | ICD-10-CM

## 2019-10-05 NOTE — Telephone Encounter (Signed)
error 

## 2019-10-06 NOTE — Telephone Encounter (Signed)
Pt wanted to know if there a antibiotic for covid pneumonia? Please advise and Thank you!  Call pt @ [phone].

## 2019-10-06 NOTE — Telephone Encounter (Signed)
My chart message was read on 2/2

## 2019-10-06 NOTE — Telephone Encounter (Signed)
LMTCB.

## 2019-10-07 NOTE — Telephone Encounter (Signed)
See xray results.

## 2019-10-08 ENCOUNTER — Other Ambulatory Visit: Payer: Self-pay

## 2019-10-08 MED ORDER — DOXYCYCLINE HYCLATE 100 MG PO TABS: 100.0000 mg | ORAL_TABLET | Freq: Two times a day (BID) | ORAL | 0 refills | Status: DC

## 2019-10-09 ENCOUNTER — Encounter: Payer: Self-pay | Admitting: Internal Medicine

## 2019-10-10 NOTE — Assessment & Plan Note (Signed)
Increased stress.  Overall appears to be handling things relatively well.  Follow.

## 2019-10-10 NOTE — Assessment & Plan Note (Signed)
Tested positive for covid 09/27/19.  Symptoms as outlined.  Reports head and chest congestion with associated cough.  No significant sob.  Treat with steroid nasal spray and saline nasal spray as directed.  Mucinex/robitussin as directed.  Prednisone taper.  Follow closely.  Discussed cxr. Call with update.

## 2019-10-18 ENCOUNTER — Ambulatory Visit: Payer: PPO | Attending: Internal Medicine

## 2019-10-18 ENCOUNTER — Telehealth: Payer: Self-pay | Admitting: Internal Medicine

## 2019-10-18 DIAGNOSIS — Z20822 Contact with and (suspected) exposure to covid-19: Secondary | ICD-10-CM | POA: Diagnosis not present

## 2019-10-18 NOTE — Telephone Encounter (Signed)
Pt is still having headaches and congestion. Pt asked to advise Trish. No appts available today. Please call pt back

## 2019-10-19 LAB — NOVEL CORONAVIRUS, NAA: SARS-CoV-2, NAA: NOT DETECTED

## 2019-10-19 NOTE — Telephone Encounter (Signed)
Patient scheduled tomorrow. Confirmed doing ok.  Has had a sinus headche almost everyday in the evening recently. Also has nasal congestion. Using saline spray. No other acute symptoms. Chest tightness is better.

## 2019-10-20 ENCOUNTER — Other Ambulatory Visit: Payer: Self-pay

## 2019-10-20 ENCOUNTER — Ambulatory Visit (INDEPENDENT_AMBULATORY_CARE_PROVIDER_SITE_OTHER): Payer: PPO | Admitting: Internal Medicine

## 2019-10-20 DIAGNOSIS — S31809A Unspecified open wound of unspecified buttock, initial encounter: Secondary | ICD-10-CM | POA: Diagnosis not present

## 2019-10-20 DIAGNOSIS — F439 Reaction to severe stress, unspecified: Secondary | ICD-10-CM | POA: Diagnosis not present

## 2019-10-20 DIAGNOSIS — U071 COVID-19: Secondary | ICD-10-CM

## 2019-10-20 MED ORDER — DOXYCYCLINE HYCLATE 100 MG PO TABS: 100.0000 mg | ORAL_TABLET | Freq: Two times a day (BID) | ORAL | 0 refills | Status: DC

## 2019-10-20 NOTE — Progress Notes (Signed)
Patient ID: Kimberly Roy, female   DOB: [birth year], 86 y.o.   MRN: [id number]   Virtual Visit via video Note  This visit type was conducted due to national recommendations for restrictions regarding the COVID-19 pandemic (e.g. social distancing).  This format is felt to be most appropriate for this patient at this time.  All issues noted in this document were discussed and addressed.  No physical exam was performed (except for noted visual exam findings with Video Visits).   I connected with [name] by a video enabled telemedicine application and verified that I am speaking with the correct person using two identifiers. Location patient: home Location provider: work  Persons participating in the virtual visit: patient, provider and pts daughter ([name]).    The limitations, risks, security and privacy concerns of performing an evaluation and management service by video and the availability of in person appointments have been discussed. The patient expressed understanding and agreed to proceed.   Reason for visit:  Work in appt.   HPI: Tested positive for covid 09/27/19.  Was treated with steroid nasal spray, saline nasal spray, mucinex and a prednisone taper (initially).  Doxycycline was added.  cxr revealed mild covid 19 pneumonia.  Increased stress with this as well as her husband just recently passed away - was in the hospital with covid. Funeral last week.  Crying intermittently through this exam.  Reports she is eating.  No vomiting or diarrhea.  Trying to stay hydrated.  Still with increased nasal congestion.  Some sinus congestion.  Some headache.  Mucus appears clear.  Denies any increased sob or chest congestion.  Cough is better.  Took laxative to help bowels move.  Blood pressure - ok - 133/54.  She is having persistent pain and discomfort from open lesion - buttock.  Discussed trying to keep pressure of the area.  Daughter using a gentle foam on the area.  Discussed the need for wound clinic  involvement.  She is agreeable.     ROS: See pertinent positives and negatives per HPI.  Past Medical History:  Diagnosis Date  . Anemia   . Anxiety   . Chest pain   . CHF (congestive heart failure) ([place])   . Constipation   . DDD (degenerative disc disease), cervical   . Depression   . DVT (deep venous thrombosis) ([place])   . Dysphonia   . Dyspnea   . Fatty liver   . Fatty liver   . Headache   . Hyperlipidemia   . Hyperpiesia   . Hypertension   . Hypothyroidism   . Interstitial cystitis   . Left ventricular dysfunction   . Lymphedema   . Nephrolithiasis   . Obstructive sleep apnea   . Osteoarthritis    knees/cervical and lumbar spine  . Pulmonary hypertension ([place])   . Pulmonary nodules    followed by Dr Raul Del  . Pure hypercholesterolemia   . Renal cyst    right    Past Surgical History:  Procedure Laterality Date  . ABDOMINAL HYSTERECTOMY     ovaries left in place  . APPENDECTOMY    . Back Surgeries    . BACK SURGERY    . BREAST REDUCTION SURGERY     3/99  . CARDIAC CATHETERIZATION    . cataracts Bilateral   . CERVICAL SPINE SURGERY    . ESOPHAGEAL MANOMETRY N/A 08/02/2015   Procedure: ESOPHAGEAL MANOMETRY (EM);  Surgeon: Josefine Class, MD;  Location: [facility] ENDOSCOPY;  Service: Endoscopy;  Laterality: N/A;  . ESOPHAGOGASTRODUODENOSCOPY N/A 02/27/2015   Procedure: ESOPHAGOGASTRODUODENOSCOPY (EGD);  Surgeon: Hulen Luster, MD;  Location: [facility] ENDOSCOPY;  Service: Gastroenterology;  Laterality: N/A;  . ESOPHAGOGASTRODUODENOSCOPY (EGD) WITH PROPOFOL N/A 10/30/2018   Procedure: ESOPHAGOGASTRODUODENOSCOPY (EGD) WITH PROPOFOL;  Surgeon: Lollie Sails, MD;  Location: [facility] ENDOSCOPY;  Service: Endoscopy;  Laterality: N/A;  . EXCISIONAL HEMORRHOIDECTOMY    . EYE SURGERY    . FRACTURE SURGERY    . HEMORRHOID SURGERY    . HIP SURGERY  2013   Right hip surgery  . JOINT REPLACEMENT    . KNEE ARTHROSCOPY     left and right  . ORIF FEMUR FRACTURE Right 08/07/2018    Procedure: OPEN REDUCTION INTERNAL FIXATION (ORIF) DISTAL FEMUR FRACTURE;  Surgeon: Hessie Knows, MD;  Location: [facility] ORS;  Service: Orthopedics;  Laterality: Right;  . REDUCTION MAMMAPLASTY Bilateral YRS AGO  . REPLACEMENT TOTAL KNEE Bilateral   . rotator cuff surgery     blilateral  . TONSILECTOMY/ADENOIDECTOMY WITH MYRINGOTOMY    . VISCERAL ARTERY INTERVENTION N/A 02/25/2019   Procedure: VISCERAL ARTERY INTERVENTION;  Surgeon: Algernon Huxley, MD;  Location: [place] [facility] LAB;  Service: Cardiovascular;  Laterality: N/A;    Family History  Problem Relation Age of Onset  . Heart disease Mother   . Stroke Mother   . Hypertension Mother   . Heart disease Father        myocardial infarction age 53  . Breast cancer Neg Hx     SOCIAL HX: reviewed.    Current Outpatient Medications:  .  aluminum-magnesium hydroxide-simethicone (MAALOX) 964-383-81 MG/5ML SUSP, Take 15 mLs by mouth 4 (four) times daily -  before meals and at bedtime. , Disp: , Rfl:  .  amLODipine (NORVASC) 10 MG tablet, TAKE 1 TABLET BY MOUTH DAILY, Disp: 90 tablet, Rfl: 1 .  atorvastatin (LIPITOR) 20 MG tablet, TAKE 1 TABLET BY MOUTH AT BEDTIME, Disp: 90 tablet, Rfl: 1 .  azelastine (ASTELIN) 0.1 % nasal spray, Place 1 spray into both nostrils 2 (two) times daily. Use in each nostril as directed, Disp: 30 mL, Rfl: 1 .  benazepril (LOTENSIN) 40 MG tablet, TAKE 1 TABLET BY MOUTH DAILY, Disp: 90 tablet, Rfl: 1 .  busPIRone (BUSPAR) 10 MG tablet, Take 10 mg by mouth 2 (two) times daily. , Disp: , Rfl:  .  calcium citrate-vitamin D (CITRACAL+D) 315-200 MG-UNIT tablet, Take 1 tablet by mouth daily., Disp: , Rfl:  .  Casanthranol-Docusate Sodium 30-100 MG CAPS, Take 1 capsule by mouth daily. , Disp: , Rfl:  .  cephALEXin (KEFLEX) 250 MG capsule, Take 250 mg by mouth daily., Disp: , Rfl:  .  Cholecalciferol (VITAMIN D3) 1000 UNITS CAPS, Take by mouth., Disp: , Rfl:  .  doxycycline (VIBRA-TABS) 100 MG tablet, Take 1 tablet (100  mg total) by mouth 2 (two) times daily., Disp: 14 tablet, Rfl: 0 .  DULoxetine (CYMBALTA) 60 MG capsule, TAKE 1 CAPSULE BY MOUTH EVERY DAY., Disp: 30 capsule, Rfl: 3 .  furosemide (LASIX) 20 MG tablet, TAKE 1 TABLET BY MOUTH DAILY AS NEEDED, Disp: 90 tablet, Rfl: 0 .  gabapentin (NEURONTIN) 300 MG capsule, Take 2 capsules (600 mg total) by mouth 3 (three) times daily., Disp: 540 capsule, Rfl: 2 .  hydrochlorothiazide (MICROZIDE) 12.5 MG capsule, Take 12.5 mg by mouth daily., Disp: , Rfl:  .  HYDROcodone-acetaminophen (NORCO) 10-325 MG tablet, Take 1 tablet by mouth 3 (three) times daily., Disp: 10 tablet, Rfl: 0 .  Inulin (FIBER CHOICE PO), Take 2 tablets by mouth daily. Chew tablets, Disp: , Rfl:  .  magnesium oxide (MAG-OX) 400 MG tablet, Take 1 tablet (400 mg total) by mouth daily., Disp: 30 tablet, Rfl: 1 .  metoprolol succinate (TOPROL-XL) 100 MG 24 hr tablet, Take 100 mg by mouth 2 (two) times daily., Disp: , Rfl:  .  morphine (MS CONTIN) 60 MG 12 hr tablet, Take 1 tablet (60 mg total) by mouth 2 (two) times daily., Disp: 10 tablet, Rfl: 0 .  Naldemedine Tosylate (SYMPROIC) 0.2 MG TABS, Take 0.2 mg by mouth daily., Disp: , Rfl:  .  nystatin cream (MYCOSTATIN), Apply 1 application topically daily as needed., Disp: 30 g, Rfl: 0 .  polyethylene glycol powder (GLYCOLAX/MIRALAX) 17 GM/SCOOP powder, MIX 17 GRAMS AS MARKED ON BOTTLE TOP IN 8 OUNCES OF WATER AND DRINK ONCE A DAY AS DIRECTED., Disp: 527 g, Rfl: 0 .  predniSONE (DELTASONE) 10 MG tablet, Take 4 tablets x 1 day and then decrease by 1/2 tablet per day until down to zero mg., Disp: 18 tablet, Rfl: 0 .  Probiotic Product (ALIGN PO), Take by mouth daily., Disp: , Rfl:  .  RABEprazole (ACIPHEX) 20 MG tablet, Take 1 tablet by mouth daily., Disp: , Rfl:  .  senna (SENOKOT) 8.6 MG TABS tablet, Take 1 tablet by mouth 2 (two) times daily., Disp: , Rfl:  .  Simethicone (GAS-X PO), Take 2 tablets by mouth 2 (two) times daily., Disp: , Rfl:  .   sodium chloride (OCEAN) 0.65 % nasal spray, Place 1 spray into the nose as needed., Disp: , Rfl:  .  SYNTHROID 100 MCG tablet, TAKE 1 TABLET BY MOUTH DAILY, Disp: 90 tablet, Rfl: 3 .  triamcinolone cream (KENALOG) 0.1 %, , Disp: , Rfl:   EXAM:  VITALS per patient if applicable: 175/10  GENERAL: alert, oriented, in no acute distress.  Crying intermittently through the exam.    HEENT: atraumatic, conjunttiva clear, no obvious abnormalities on inspection of external nose and ears  NECK: normal movements of the head and neck  LUNGS: on inspection no signs of respiratory distress, breathing rate appears normal, no obvious gross SOB, gasping or wheezing  CV: no obvious cyanosis  SKIN:  Open wound - buttock.  Some minimal surrounding erythema.    PSYCH/NEURO: pleasant and cooperative, no obvious depression or anxiety, speech and thought processing grossly intact  ASSESSMENT AND PLAN:  Discussed the following assessment and plan:  Wound of buttock Persistent wound - buttock.  Discussed trying to keep pressure off the area.  Tried initially to keep the area dry.  Increased size.  Some minimal erythema. Has been on doxycycline.  Extending out for respiratory issues.  Should cover - open wound.  Refer to wound clinic.    Stress Increased stress.  Husband recently passed away.  Sick with covid.  Daughter staying with her .  Follow.    COVID-19 virus infection Diagnosed with covid 09/27/19.  Has been treated with steroid nasal spray, saline nasal spray, mucinex, prednisone taper and doxycycline.  Is some better.  Denies any increased sob.  No chest congestion.  Cough is better.  cxr with changes c/w mild covid pneumonia.  Head is some better.  Will extend out doxycycline x 1 week.  Continue nasal sprays and mucinex.  Follow closely.  May need f/u with pulmonary if persistent symptoms.     Orders Placed This Encounter  Procedures  . Ambulatory referral to Wound Clinic  Referral Priority:    Urgent    Referral Type:   Consultation    Referral Reason:   Specialty Services Required    Requested Specialty:   Wound Care    Number of Visits Requested:   1    Meds ordered this encounter  Medications  . doxycycline (VIBRA-TABS) 100 MG tablet    Sig: Take 1 tablet (100 mg total) by mouth 2 (two) times daily.    Dispense:  14 tablet    Refill:  0     I discussed the assessment and treatment plan with the patient. The patient was provided an opportunity to ask questions and all were answered. The patient agreed with the plan and demonstrated an understanding of the instructions.   The patient was advised to call back or seek an in-person evaluation if the symptoms worsen or if the condition fails to improve as anticipated.   Einar Pheasant, MD

## 2019-10-25 ENCOUNTER — Telehealth: Payer: Self-pay | Admitting: Internal Medicine

## 2019-10-25 ENCOUNTER — Encounter: Payer: Self-pay | Admitting: Internal Medicine

## 2019-10-25 DIAGNOSIS — S31809A Unspecified open wound of unspecified buttock, initial encounter: Secondary | ICD-10-CM | POA: Insufficient documentation

## 2019-10-25 NOTE — Assessment & Plan Note (Signed)
Increased stress.  Husband recently passed away.  Sick with covid.  Daughter staying with her .  Follow.

## 2019-10-25 NOTE — Telephone Encounter (Signed)
Pt states that the doxycycline (VIBRA-TABS) 100 MG tablet is not working to alleviate symptoms. Please advise

## 2019-10-25 NOTE — Assessment & Plan Note (Signed)
Persistent wound - buttock.  Discussed trying to keep pressure off the area.  Tried initially to keep the area dry.  Increased size.  Some minimal erythema. Has been on doxycycline.  Extending out for respiratory issues.  Should cover - open wound.  Refer to wound clinic.

## 2019-10-25 NOTE — Assessment & Plan Note (Signed)
Diagnosed with covid 09/27/19.  Has been treated with steroid nasal spray, saline nasal spray, mucinex, prednisone taper and doxycycline.  Is some better.  Denies any increased sob.  No chest congestion.  Cough is better.  cxr with changes c/w mild covid pneumonia.  Head is some better.  Will extend out doxycycline x 1 week.  Continue nasal sprays and mucinex.  Follow closely.  May need f/u with pulmonary if persistent symptoms.

## 2019-10-26 NOTE — Telephone Encounter (Signed)
Pt was returning your call.

## 2019-10-26 NOTE — Telephone Encounter (Signed)
Called pt. She is doing ok overall but started wheezing last night. Requesting an inhaler. She is set up to see respiratory clinic @ 5:30 on Friday 2/26 (first available). She is aware that you are out of the office and will return tomorrow. Confirmed doing ok. No acute distress. Are you ok with sending in inhaler for her to use?

## 2019-10-26 NOTE — Telephone Encounter (Signed)
LMTCB

## 2019-10-27 MED ORDER — ALBUTEROL SULFATE HFA 108 (90 BASE) MCG/ACT IN AERS: 2.0000 | INHALATION_SPRAY | Freq: Four times a day (QID) | RESPIRATORY_TRACT | 0 refills | Status: DC | PRN

## 2019-10-27 NOTE — Addendum Note (Signed)
Addended by: Alisa Graff on: 10/27/2019 05:41 AM   Modules accepted: Orders

## 2019-10-27 NOTE — Telephone Encounter (Signed)
Albuterol inhaler sent in to [facility].  I was not sure which pharmacy to send the prescription.  Recently has used [facility].  Will need to confirm correct pharmacy.  Also, if worsening symptoms, will need to be evaluated earlier.

## 2019-10-27 NOTE — Telephone Encounter (Signed)
Pt aware of below and [facility].

## 2019-10-29 ENCOUNTER — Ambulatory Visit (INDEPENDENT_AMBULATORY_CARE_PROVIDER_SITE_OTHER): Payer: PPO

## 2019-10-29 ENCOUNTER — Other Ambulatory Visit: Payer: Self-pay

## 2019-10-29 ENCOUNTER — Other Ambulatory Visit: Payer: Self-pay | Admitting: Family Medicine

## 2019-10-29 ENCOUNTER — Ambulatory Visit (INDEPENDENT_AMBULATORY_CARE_PROVIDER_SITE_OTHER): Payer: PPO | Admitting: Family Medicine

## 2019-10-29 VITALS — BP 162/92 | HR 66 | Temp 99.2°F | Resp 16 | Wt 185.0 lb

## 2019-10-29 DIAGNOSIS — U071 COVID-19: Secondary | ICD-10-CM

## 2019-10-29 DIAGNOSIS — J1282 Pneumonia due to coronavirus disease 2019: Secondary | ICD-10-CM

## 2019-10-29 DIAGNOSIS — R0602 Shortness of breath: Secondary | ICD-10-CM

## 2019-10-29 DIAGNOSIS — R0789 Other chest pain: Secondary | ICD-10-CM | POA: Diagnosis not present

## 2019-10-29 DIAGNOSIS — J019 Acute sinusitis, unspecified: Secondary | ICD-10-CM | POA: Diagnosis not present

## 2019-10-29 DIAGNOSIS — R5383 Other fatigue: Secondary | ICD-10-CM | POA: Diagnosis not present

## 2019-10-29 DIAGNOSIS — R05 Cough: Secondary | ICD-10-CM | POA: Diagnosis not present

## 2019-10-29 MED ORDER — GUAIFENESIN 100 MG/5ML PO SOLN: 15.0000 mL | ORAL | 0 refills | Status: DC | PRN

## 2019-10-29 MED ORDER — PREDNISONE 20 MG PO TABS: ORAL_TABLET | ORAL | 0 refills | Status: DC

## 2019-10-29 MED ORDER — ALBUTEROL SULFATE HFA 108 (90 BASE) MCG/ACT IN AERS: 2.0000 | INHALATION_SPRAY | Freq: Four times a day (QID) | RESPIRATORY_TRACT | 0 refills | Status: DC | PRN

## 2019-10-29 MED ORDER — CEFDINIR 300 MG PO CAPS: 300.0000 mg | ORAL_CAPSULE | Freq: Every day | ORAL | 0 refills | Status: DC

## 2019-10-29 NOTE — Patient Instructions (Addendum)
Start Omnicef 600 mg once daily for 10 days for coverage of sinusitis and this will cover any bronchitis or pneumonia.  Start guaifenesin 15 mL every 6 hours to loosen secretions and relief chest congestion.  Start you back on a course of prednisone you will take as follows: Take Prednisone 20 mg,  in mornings with breakfast as follows:  Take 3 pills for 3 days, Take 2 pills for 3 days, and Take 1 pill for 3 days.  Complete all medication.  Continue albuterol inhaler 2 puffs every 6 hours as needed for shortness of breath.  Encourage you to keep practicing deep breathing as this will keep your lungs open and strong.  We will notify you of your lab results via MyChart and copy your primary care provider on those results.  If breathing worsens and is unable to improve with rest and or chest pain develops, go immediately to the Emergency Department.   I am placing a referral to remote health which is a program designated for patient diagnosed with COVID-19 to be followed in the home.      COVID-19 Frequently Asked Questions COVID-19 (coronavirus disease) is an infection that is caused by a large family of viruses. Some viruses cause illness in people and others cause illness in animals like camels, cats, and bats. In some cases, the viruses that cause illness in animals can spread to humans. Where did the coronavirus come from? In December 2019, [place] told the [facility]) of several cases of lung disease (human respiratory illness). These cases were linked to an open seafood and livestock market in the city of [place]. The link to the seafood and livestock market suggests that the virus may have spread from animals to humans. However, since that first outbreak in December, the virus has also been shown to spread from person to person. What is the name of the disease and the virus? Disease name Early on, this disease was called novel coronavirus. This is because scientists  determined that the disease was caused by a new (novel) respiratory virus. The World Health Organization [facility]) has now named the disease COVID-19, or coronavirus disease. Virus name The virus that causes the disease is called severe acute respiratory syndrome coronavirus 2 (SARS-CoV-2). More information on disease and virus naming World Health Organization [facility]): www.who.int/emergencies/diseases/novel-coronavirus-2019/technical-guidance/naming-the-coronavirus-disease-(covid-2019)-and-the-virus-that-causes-it Who is at risk for complications from coronavirus disease? Some people may be at higher risk for complications from coronavirus disease. This includes older adults and people who have chronic diseases, such as heart disease, diabetes, and lung disease. If you are at higher risk for complications, take these extra precautions:  Stay home as much as possible.  Avoid social gatherings and travel.  Avoid close contact with others. Stay at least 6 ft (2 m) away from others, if possible.  Wash your hands often with soap and water for at least 20 seconds.  Avoid touching your face, mouth, nose, or eyes.  Keep supplies on hand at home, such as food, medicine, and cleaning supplies.  If you must go out in public, wear a cloth face covering or face mask. Make sure your mask covers your nose and mouth. How does coronavirus disease spread? The virus that causes coronavirus disease spreads easily from person to person (is contagious). You may catch the virus by:  Breathing in droplets from an infected person. Droplets can be spread by a person breathing, speaking, singing, coughing, or sneezing.  Touching something, like a table or a doorknob, that was  exposed to the virus (contaminated) and then touching your mouth, nose, or eyes. Can I get the virus from touching surfaces or objects? There is still a lot that we do not know about the virus that causes coronavirus disease. Scientists are basing  a lot of information on what they know about similar viruses, such as:  Viruses cannot generally survive on surfaces for long. They need a human body (host) to survive.  It is more likely that the virus is spread by close contact with people who are sick (direct contact), such as through: ? Shaking hands or hugging. ? Breathing in respiratory droplets that travel through the air. Droplets can be spread by a person breathing, speaking, singing, coughing, or sneezing.  It is less likely that the virus is spread when a person touches a surface or object that has the virus on it (indirect contact). The virus may be able to enter the body if the person touches a surface or object and then touches his or her face, eyes, nose, or mouth. Can a person spread the virus without having symptoms of the disease? It may be possible for the virus to spread before a person has symptoms of the disease, but this is most likely not the main way the virus is spreading. It is more likely for the virus to spread by being in close contact with people who are sick and breathing in the respiratory droplets spread by a person breathing, speaking, singing, coughing, or sneezing. What are the symptoms of coronavirus disease? Symptoms vary from person to person and can range from mild to severe. Symptoms may include:  Fever or chills.  Cough.  Difficulty breathing or feeling short of breath.  Headaches, body aches, or muscle aches.  Runny or stuffy (congested) nose.  Sore throat.  New loss of taste or smell.  Nausea, vomiting, or diarrhea. These symptoms can appear anywhere from 2 to 14 days after you have been exposed to the virus. Some people may not have any symptoms. If you develop symptoms, call your health care provider. People with severe symptoms may need hospital care. Should I be tested for this virus? Your health care provider will decide whether to test you based on your symptoms, history of exposure,  and your risk factors. How does a health care provider test for this virus? Health care providers will collect samples to send for testing. Samples may include:  Taking a swab of fluid from the back of your nose and throat, your nose, or your throat.  Taking fluid from the lungs by having you cough up mucus (sputum) into a sterile cup.  Taking a blood sample. Is there a treatment or vaccine for this virus? Currently, there is no vaccine to prevent coronavirus disease. Also, there are no medicines like antibiotics or antivirals to treat the virus. A person who becomes sick is given supportive care, which means rest and fluids. A person may also relieve his or her symptoms by using over-the-counter medicines that treat sneezing, coughing, and runny nose. These are the same medicines that a person takes for the common cold. If you develop symptoms, call your health care provider. People with severe symptoms may need hospital care. What can I do to protect myself and my family from this virus?     You can protect yourself and your family by taking the same actions that you would take to prevent the spread of other viruses. Take the following actions:  Wash your hands often  with soap and water for at least 20 seconds. If soap and water are not available, use alcohol-based hand sanitizer.  Avoid touching your face, mouth, nose, or eyes.  Cough or sneeze into a tissue, sleeve, or elbow. Do not cough or sneeze into your hand or the air. ? If you cough or sneeze into a tissue, throw it away immediately and wash your hands.  Disinfect objects and surfaces that you frequently touch every day.  Stay away from people who are sick.  Avoid going out in public, follow guidance from your state and local health authorities.  Avoid crowded indoor spaces. Stay at least 6 ft (2 m) away from others.  If you must go out in public, wear a cloth face covering or face mask. Make sure your mask covers your  nose and mouth.  Stay home if you are sick, except to get medical care. Call your health care provider before you get medical care. Your health care provider will tell you how long to stay home.  Make sure your vaccines are up to date. Ask your health care provider what vaccines you need. What should I do if I need to travel? Follow travel recommendations from your local health authority, the CDC, and WHO. Travel information and advice  Centers for Disease Control and Prevention (CDC): BodyEditor.hu  World Health Organization [facility]): ThirdIncome.ca Know the risks and take action to protect your health  You are at higher risk of getting coronavirus disease if you are traveling to areas with an outbreak or if you are exposed to travelers from areas with an outbreak.  Wash your hands often and practice good hygiene to lower the risk of catching or spreading the virus. What should I do if I am sick? General instructions to stop the spread of infection  Wash your hands often with soap and water for at least 20 seconds. If soap and water are not available, use alcohol-based hand sanitizer.  Cough or sneeze into a tissue, sleeve, or elbow. Do not cough or sneeze into your hand or the air.  If you cough or sneeze into a tissue, throw it away immediately and wash your hands.  Stay home unless you must get medical care. Call your health care provider or local health authority before you get medical care.  Avoid public areas. Do not take public transportation, if possible.  If you can, wear a mask if you must go out of the house or if you are in close contact with someone who is not sick. Make sure your mask covers your nose and mouth. Keep your home clean  Disinfect objects and surfaces that are frequently touched every day. This may include: ? Counters and tables. ? Doorknobs and light  switches. ? Sinks and faucets. ? Electronics such as phones, remote controls, keyboards, computers, and tablets.  Wash dishes in hot, soapy water or use a dishwasher. Air-dry your dishes.  Wash laundry in hot water. Prevent infecting other household members  Let healthy household members care for children and pets, if possible. If you have to care for children or pets, wash your hands often and wear a mask.  Sleep in a different bedroom or bed, if possible.  Do not share personal items, such as razors, toothbrushes, deodorant, combs, brushes, towels, and washcloths. Where to find more information Centers for Disease Control and Prevention (CDC)  Information and news updates: https://www.butler-gonzalez.com/ World Health Organization [facility])  Information and news updates: MissExecutive.com.ee  Coronavirus health topic: https://www.castaneda.info/  Questions and answers on COVID-19: OpportunityDebt.at  Global tracker: who.sprinklr.com American Academy of Pediatrics (AAP)  Information for families: www.healthychildren.org/English/health-issues/conditions/chest-lungs/Pages/2019-Novel-Coronavirus.aspx The coronavirus situation is changing rapidly. Check your local health authority website or the CDC and [facility] websites for updates and news. When should I contact a health care provider?  Contact your health care provider if you have symptoms of an infection, such as fever or cough, and you: ? Have been near anyone who is known to have coronavirus disease. ? Have come into contact with a person who is suspected to have coronavirus disease. ? Have traveled to an area where there is an outbreak of COVID-19. When should I get emergency medical care?  Get help right away by calling your local emergency services (911 in the [place] if you have: ? Trouble breathing. ? Pain or pressure in your  chest. ? Confusion. ? Blue-tinged lips and fingernails. ? Difficulty waking from sleep. ? Symptoms that get worse. Let the emergency medical personnel know if you think you have coronavirus disease. Summary  A new respiratory virus is spreading from person to person and causing COVID-19 (coronavirus disease).  The virus that causes COVID-19 appears to spread easily. It spreads from one person to another through droplets from breathing, speaking, singing, coughing, or sneezing.  Older adults and those with chronic diseases are at higher risk of disease. If you are at higher risk for complications, take extra precautions.  There is currently no vaccine to prevent coronavirus disease. There are no medicines, such as antibiotics or antivirals, to treat the virus.  You can protect yourself and your family by washing your hands often, avoiding touching your face, and covering your coughs and sneezes. This information is not intended to replace advice given to you by your health care provider. Make sure you discuss any questions you have with your health care provider. Document Revised: 06/18/2019 Document Reviewed: 12/15/2018 Elsevier Patient Education  [place] of Breath, Adult Shortness of breath means you have trouble breathing. Shortness of breath could be a sign of a medical problem. Follow these instructions at home:   Watch for any changes in your symptoms.  Do not use any products that contain nicotine or tobacco, such as cigarettes, e-cigarettes, and chewing tobacco.  Do not smoke. Smoking can cause shortness of breath. If you need help to quit smoking, ask your doctor.  Avoid things that can make it harder to breathe, such as: ? Mold. ? Dust. ? Air pollution. ? Chemical smells. ? Things that can cause allergy symptoms (allergens), if you have allergies.  Keep your living space clean. Use products that help remove mold and dust.  Rest as needed.  Slowly return to your normal activities.  Take over-the-counter and prescription medicines only as told by your doctor. This includes oxygen therapy and inhaled medicines.  Keep all follow-up visits as told by your doctor. This is important. Contact a doctor if:  Your condition does not get better as soon as expected.  You have a hard time doing your normal activities, even after you rest.  You have new symptoms. Get help right away if:  Your shortness of breath gets worse.  You have trouble breathing when you are resting.  You feel light-headed or you pass out (faint).  You have a cough that is not helped by medicines.  You cough up blood.  You have pain with breathing.  You have pain in your chest, arms, shoulders, or belly (abdomen).  You  have a fever.  You cannot walk up stairs.  You cannot exercise the way you normally do. These symptoms may represent a serious problem that is an emergency. Do not wait to see if the symptoms will go away. Get medical help right away. Call your local emergency services (911 in the [place] Do not drive yourself to the hospital. Summary  Shortness of breath is when you have trouble breathing enough air. It can be a sign of a medical problem.  Avoid things that make it hard for you to breathe, such as smoking, pollution, mold, and dust.  Watch for any changes in your symptoms. Contact your doctor if you do not get better or you get worse. This information is not intended to replace advice given to you by your health care provider. Make sure you discuss any questions you have with your health care provider. Document Revised: 01/19/2018 Document Reviewed: 01/19/2018 Elsevier Patient Education  [place].

## 2019-10-29 NOTE — Progress Notes (Signed)
Patient ID: Kimberly Roy, female    DOB: November 29, 1930, 86 y.o.   MRN: [id number]  PCP: Einar Pheasant, MD  Chief Complaint  Patient presents with  . Shortness of Breath    post covid SOB and chest tightness     Subjective:  HPI  Kimberly Roy is a 86 y.o. female presents to [facility] clinic for evaluation of symptoms related to COVID-19 infection.   Patient diagnosed with COVID-19 on September 28, 2019 after providing direct care to spouse who subsequently passed away from complications related to COVID-19 infection. Chest x-ray 10/05/19 confirmed mild COVID-19 pneumonia. Patient has been managed at home with conservative measures including two course of Doxycyline and a short course of prednisone prescribed by PCP. Symptoms of shortness of breath have progressed and are present at rest. She has underlying CHF, OSA, and obesity which increases risk for complications associated with COVID-19. Most recently, PCP started her on an albuterol inhaler which she uses without significant improvement. Endorses occasional chest tight and HA which includes facial pressure behind the eyes and at forehead. This has been present for several weeks. She unable to produce much mucus with blowing her nose and right side feels completely occluded. BP is elevated today, however controlled at home less than 130/80 with her current medications.     Review of Systems Pertinent negatives listed in HPI  Patient Active Problem List   Diagnosis Date Noted  . Wound of buttock 10/25/2019  . COVID-19 virus infection 10/04/2019  . Urinary frequency 09/26/2019  . Cold feeling 09/12/2019  . Left knee pain 07/04/2019  . Hemoptysis 06/30/2019  . Femur fracture ([place]) 08/06/2018  . Right shoulder pain 05/16/2017  . Chronic venous insufficiency 02/10/2017  . Lymphedema 02/10/2017  . Neuropathy 12/25/2016  . Anemia due to blood loss, chronic 08/07/2016  . GERD (gastroesophageal reflux disease) 12/10/2015  . Finger pain  11/13/2015  . Carotid artery calcification 09/26/2015  . External nasal lesion 09/25/2015  . Muscle cramps 09/01/2015  . Excessive sweating 07/30/2015  . Headache 07/16/2015  . Groin pain 05/14/2015  . Muscle twitching 03/05/2015  . Leg cramps 02/11/2015  . Back pain 02/11/2015  . Abdominal pain 02/11/2015  . Acute cystitis without hematuria 12/12/2014  . Left elbow pain 11/30/2014  . Health care maintenance 11/30/2014  . Osteoporosis 10/19/2014  . Rectal bleeding 10/19/2014  . Neck pain 09/03/2014  . Unsteady gait 09/03/2014  . Nocturia 09/03/2014  . Dysphagia 06/01/2014  . Nasal dryness 06/01/2014  . Stress 06/01/2014  . Fatigue 02/27/2014  . Pre-op evaluation 10/12/2013  . Hoarseness 08/14/2013  . Leg swelling 01/24/2013  . CHF (congestive heart failure) ([place]) 12/29/2012  . Cough 12/29/2012  . OSA (obstructive sleep apnea) 07/09/2012  . Osteoarthritis 07/04/2012  . Anemia 07/04/2012  . Chronic constipation 07/04/2012  . Pulmonary hypertension ([place]) 07/04/2012  . Pulmonary nodules 07/04/2012  . Hypertension 07/04/2012  . Hyperlipidemia 07/04/2012  . Hypothyroidism 07/04/2012      Prior to Admission medications   Medication Sig Start Date End Date Taking? Authorizing Provider  albuterol (VENTOLIN HFA) 108 (90 Base) MCG/ACT inhaler Inhale 2 puffs into the lungs every 6 (six) hours as needed for wheezing or shortness of breath. 10/27/19   Einar Pheasant, MD  aluminum-magnesium hydroxide-simethicone (MAALOX) 200-200-20 MG/5ML SUSP Take 15 mLs by mouth 4 (four) times daily -  before meals and at bedtime.     [provider]  amLODipine (NORVASC) 10 MG tablet TAKE 1 TABLET BY MOUTH DAILY  08/09/19   Einar Pheasant, MD  atorvastatin (LIPITOR) 20 MG tablet TAKE 1 TABLET BY MOUTH AT BEDTIME 08/09/19   Einar Pheasant, MD  azelastine (ASTELIN) 0.1 % nasal spray Place 1 spray into both nostrils 2 (two) times daily. Use in each nostril as directed 10/15/18   Einar Pheasant, MD  benazepril (LOTENSIN) 40 MG tablet TAKE 1 TABLET BY MOUTH DAILY 08/09/19   Einar Pheasant, MD  busPIRone (BUSPAR) 10 MG tablet Take 10 mg by mouth 2 (two) times daily.  03/31/18   [provider]  calcium citrate-vitamin D (CITRACAL+D) 315-200 MG-UNIT tablet Take 1 tablet by mouth daily.    [provider]  Casanthranol-Docusate Sodium 30-100 MG CAPS Take 1 capsule by mouth daily.     [provider]  cephALEXin (KEFLEX) 250 MG capsule Take 250 mg by mouth daily.    [provider]  Cholecalciferol (VITAMIN D3) 1000 UNITS CAPS Take by mouth.    [provider]  DULoxetine (CYMBALTA) 60 MG capsule TAKE 1 CAPSULE BY MOUTH EVERY DAY. 12/25/16   Einar Pheasant, MD  furosemide (LASIX) 20 MG tablet TAKE 1 TABLET BY MOUTH DAILY AS NEEDED 05/25/19   Einar Pheasant, MD  gabapentin (NEURONTIN) 300 MG capsule Take 2 capsules (600 mg total) by mouth 3 (three) times daily. 04/08/19   Einar Pheasant, MD  hydrochlorothiazide (MICROZIDE) 12.5 MG capsule Take 12.5 mg by mouth daily.    [provider]  HYDROcodone-acetaminophen (NORCO) 10-325 MG tablet Take 1 tablet by mouth 3 (three) times daily. 08/10/18   Fritzi Mandes, MD  magnesium oxide (MAG-OX) 400 MG tablet Take 1 tablet (400 mg total) by mouth daily. 02/11/15   Einar Pheasant, MD  metoprolol succinate (TOPROL-XL) 100 MG 24 hr tablet Take 100 mg by mouth 2 (two) times daily. 03/24/19   [provider]  morphine (MS CONTIN) 60 MG 12 hr tablet Take 1 tablet (60 mg total) by mouth 2 (two) times daily. 08/10/18   Fritzi Mandes, MD  Naldemedine Tosylate (SYMPROIC) 0.2 MG TABS Take 0.2 mg by mouth daily.    [provider]  nystatin cream (MYCOSTATIN) Apply 1 application topically daily as needed. 09/27/19   Einar Pheasant, MD  polyethylene glycol powder (GLYCOLAX/MIRALAX) 17 GM/SCOOP powder MIX 17 GRAMS AS MARKED ON BOTTLE TOP IN 8 OUNCES OF WATER AND DRINK ONCE A DAY AS DIRECTED. 02/05/19    Einar Pheasant, MD  predniSONE (DELTASONE) 10 MG tablet Take 4 tablets x 1 day and then decrease by 1/2 tablet per day until down to zero mg. 10/04/19   Einar Pheasant, MD  Probiotic Product (ALIGN PO) Take by mouth daily.    [provider]  RABEprazole (ACIPHEX) 20 MG tablet Take 1 tablet by mouth daily. 10/20/18   [provider]  senna (SENOKOT) 8.6 MG TABS tablet Take 1 tablet by mouth 2 (two) times daily.    [provider]  Simethicone (GAS-X PO) Take 2 tablets by mouth 2 (two) times daily.    [provider]  sodium chloride (OCEAN) 0.65 % nasal spray Place 1 spray into the nose as needed.    [provider]  SYNTHROID 100 MCG tablet TAKE 1 TABLET BY MOUTH DAILY 05/25/19   Einar Pheasant, MD  triamcinolone cream (KENALOG) 0.1 %  01/23/17   [provider]    Past Medical, Surgical Family and Social History reviewed and updated.    Objective:   Today's Vitals   10/29/19 1746  BP: (!) 162/92  Pulse: 66  Resp: 16  Temp: 99.2 F (37.3 C)  TempSrc: Oral  SpO2: 95%  Weight: 185 lb (83.9 kg)    Wt Readings from Last 3 Encounters:  10/29/19 185 lb (83.9 kg)  10/04/19 185 lb (83.9 kg)  09/07/19 185 lb (83.9 kg)     Physical Exam Constitutional:      Appearance: She is obese. She is ill-appearing.  HENT:     Head: Normocephalic.  Pulmonary:     Breath sounds: Decreased breath sounds and wheezing present.     Comments: Increase WOB Abdominal:     Palpations: Abdomen is soft.  Musculoskeletal:     Comments: Impaired Gait  Skin:    General: Skin is warm.  Neurological:     Mental Status: She is oriented to person, place, and time.     Motor: Weakness present.     Comments: Generalized weakness  Psychiatric:        Mood and Affect: Mood normal.      Assessment & Plan:  1. COVID-19 virus infection, symptomatic.  Although patient has continued to experience residual respiratory symptoms related to infection and  pneumonia. Repeated CXR -negative for PNA - CBC with Differential, evaluating for leukocytosis  -Patient referred to [facility] given moderate risk for complication and hospitalization related to COVID-19 virus.  2. Pneumonia due to COVID-19 virus Chest x-ray repeated, unfortunately only 1 view available patient is unable to ambulate to standing position. Imaging negative for opacities or effusions. Given persistent and worsening SOB, will cover for bronchitis and pneumonia with Omnicef  600 mg once daily x 10 days. Wheezing, start a prednisone taper . Continue Albuterol -Mucinex syrup prescribed to loosen secretions. - Comprehensive metabolic panel, checking renal, liver, and electrolyte function.  3. Shortness of breath -Continue albuterol -Start prednisone taper   4. Other fatigue -Activities as tolerated -Continue vitamin D, C , Zinc -treatment to improve increase WOB should improve activity tolerance   5. Acute Sinusitis  -Omnicef 600 mg x 10 days       Meds ordered this encounter  Medications  . cefdinir (OMNICEF) 300 MG capsule    Sig: Take 1 capsule (300 mg total) by mouth daily.    Dispense:  20 capsule    Refill:  0  . predniSONE (DELTASONE) 20 MG tablet    Sig: Take 3 PO QAM x3days, 2 PO QAM x3days, 1 PO QAM x3days    Dispense:  18 tablet    Refill:  0  . guaiFENesin (ROBITUSSIN) 100 MG/5ML SOLN    Sig: Take 15 mLs (300 mg total) by mouth every 4 (four) hours as needed for cough or to loosen phlegm.    Dispense:  473 mL    Refill:  0  . albuterol (VENTOLIN HFA) 108 (90 Base) MCG/ACT inhaler    Sig: Inhale 2 puffs into the lungs every 6 (six) hours as needed for wheezing or shortness of breath (cough, shortness of breath or wheezing.).    Dispense:  18 g    Refill:  0      -The patient was given clear instructions to go to ER or return to medical center if symptoms do not improve, worsen or new problems develop. The patient verbalized  understanding.     Molli Barrows, FNP-C [facility], PRN Provider  [facility]. [place] Phone: [phone] Clinic Fax: [phone] Clinic Hours: 5:30 pm -7:30 pm (Monday-Friday)

## 2019-11-01 ENCOUNTER — Telehealth: Payer: Self-pay

## 2019-11-01 MED ORDER — AZITHROMYCIN 250 MG PO TABS: ORAL_TABLET | ORAL | 0 refills | Status: DC

## 2019-11-01 NOTE — Telephone Encounter (Signed)
Stop all antibiotics tonight-keflex and Cefdinir. Resume Cefdinir only tomorrow evening with dinner. I think the reaction is related to her taking both Cefdinir and keflex together. Likely to much antibiotic for GI system. We will follow-up with her via phone to check on her Wednesday.

## 2019-11-01 NOTE — Telephone Encounter (Signed)
Called pt dtr and informed of [name] recommendation.   Pt dtr stated that pt has stopped the Keflex and still had the reaction to the Cefdinir.   Please advise.

## 2019-11-01 NOTE — Telephone Encounter (Signed)
Per patient she is not taking Keflex and Cefdinir. I will cover Azithromycin and she can resume keflex for chronic cystitis. Will have staff call patient to ensure N&V has resolved.

## 2019-11-01 NOTE — Telephone Encounter (Signed)
Pt dtr (debbie collins) called and stated that pt was not able to take the abx (cefdinir 300 mg) that was prescribed at the visit on Friday. Severe vomiting and diarrhea were the side effects.   Pt dtr is requesting an alternative.

## 2019-11-02 ENCOUNTER — Encounter: Payer: PPO | Attending: Physician Assistant | Admitting: Physician Assistant

## 2019-11-02 ENCOUNTER — Other Ambulatory Visit: Payer: Self-pay

## 2019-11-02 DIAGNOSIS — I10 Essential (primary) hypertension: Secondary | ICD-10-CM | POA: Diagnosis not present

## 2019-11-02 DIAGNOSIS — L89312 Pressure ulcer of right buttock, stage 2: Secondary | ICD-10-CM | POA: Diagnosis not present

## 2019-11-02 DIAGNOSIS — G4730 Sleep apnea, unspecified: Secondary | ICD-10-CM | POA: Diagnosis not present

## 2019-11-02 LAB — COMPREHENSIVE METABOLIC PANEL
ALT: 6 IU/L (ref 0–32)
AST: 15 IU/L (ref 0–40)
Albumin/Globulin Ratio: 1.2 (ref 1.2–2.2)
Albumin: 3.3 g/dL — ABNORMAL LOW (ref 3.6–4.6)
Alkaline Phosphatase: 100 IU/L (ref 39–117)
BUN/Creatinine Ratio: 17 (ref 12–28)
BUN: 14 mg/dL (ref 8–27)
Bilirubin Total: <0.2 mg/dL (ref 0.0–1.2)
CO2: 30 mmol/L — ABNORMAL HIGH (ref 20–29)
Calcium: 9.8 mg/dL (ref 8.7–10.3)
Chloride: 100 mmol/L (ref 96–106)
Creatinine, Ser: 0.84 mg/dL (ref 0.57–1.00)
GFR calc Af Amer: 73 mL/min/{1.73_m2} (ref >59)
GFR calc non Af Amer: 63 mL/min/{1.73_m2} (ref >59)
Globulin, Total: 2.7 g/dL (ref 1.5–4.5)
Glucose: 89 mg/dL (ref 65–99)
Potassium: 5.3 mmol/L — ABNORMAL HIGH (ref 3.5–5.2)
Sodium: 140 mmol/L (ref 134–144)
Total Protein: 6 g/dL (ref 6.0–8.5)

## 2019-11-02 LAB — CBC WITH DIFFERENTIAL/PLATELET
Basophils Absolute: 0 10*3/uL (ref 0.0–0.2)
Basos: 0 %
EOS (ABSOLUTE): 0.3 10*3/uL (ref 0.0–0.4)
Eos: 4 %
Hematocrit: 30.8 % — ABNORMAL LOW (ref 34.0–46.6)
Hemoglobin: 10.4 g/dL — ABNORMAL LOW (ref 11.1–15.9)
Immature Grans (Abs): 0 10*3/uL (ref 0.0–0.1)
Immature Granulocytes: 0 %
Lymphocytes Absolute: 2 10*3/uL (ref 0.7–3.1)
Lymphs: 22 %
MCH: 32.2 pg (ref 26.6–33.0)
MCHC: 33.8 g/dL (ref 31.5–35.7)
MCV: 95 fL (ref 79–97)
Monocytes Absolute: 1 10*3/uL — ABNORMAL HIGH (ref 0.1–0.9)
Monocytes: 11 %
Neutrophils Absolute: 5.6 10*3/uL (ref 1.4–7.0)
Neutrophils: 63 %
Platelets: 284 10*3/uL (ref 150–450)
RBC: 3.23 x10E6/uL — ABNORMAL LOW (ref 3.77–5.28)
RDW: 12.5 % (ref 11.7–15.4)
WBC: 8.9 10*3/uL (ref 3.4–10.8)

## 2019-11-02 NOTE — Telephone Encounter (Signed)
Late Entry - Monday 3.1.21 7pm - 8pm: Spoke to patient dtr (debbie) and informed to restart the Keflex and that zpak was sent in. Pt dtr agreed.    Spoke to [employer] at [employer]. Informed of conversation last night with Gaynelle Adu and myself. Informed that pt tolerated the azithromycin well and we are sending that in due to the intolerated cefdinir that was tried over that last several days.   Dawn stated understanding and will dispense the abx.

## 2019-11-02 NOTE — Telephone Encounter (Signed)
Dawn with [employer] calling and states that the medication that was sent in yesterday, azithromycin (ZITHROMAX) 250 MG tablet States that patient has an extensive allergy list and this falls into the family of one on the the allergy list since it is a Macrolide. Please advise.  States that a call could be given if you wanted the extensive allergy list.  CB#: [phone]

## 2019-11-02 NOTE — Progress Notes (Signed)
Keckler, Monserrat W. ([id number]) Visit Report for 11/02/2019 Abuse/Suicide Risk Screen Details Patient Name: Kimberly Roy, Kimberly Roy [facility]. Date of Service: 11/02/2019 1:15 PM Medical Record Number: [id number] Patient Account Number: [id number] Date of Birth/Sex: 02-19-1931 (86 y.o. F) Treating RN: Army Melia Primary Care Maahir Horst: Einar Pheasant Other Clinician: Referring Ruari Duggan: Einar Pheasant Treating Aleyza Salmi/Extender: Melburn Hake, HOYT Weeks in Treatment: 0 Abuse/Suicide Risk Screen Items Answer ABUSE RISK SCREEN: Has anyone close to you tried to hurt or harm you recentlyo No Do you feel uncomfortable with anyone in your familyo No Has anyone forced you do things that you didnot want to doo No Electronic Signature(s) Signed: 11/02/2019 4:27:58 PM By: Army Melia Entered By: Army Melia on 11/02/2019 13:39:16 Bahr, Shelbee W. ([id number]) -------------------------------------------------------------------------------- Activities of Daily Living Details Patient Name: Kimberly Roy Date of Service: 11/02/2019 1:15 PM Medical Record Number: [id number] Patient Account Number: [id number] Date of Birth/Sex: 1930/12/05 (86 y.o. F) Treating RN: Army Melia Primary Care Fay Bagg: Einar Pheasant Other Clinician: Referring Tyrrell Stephens: Einar Pheasant Treating Caroline Matters/Extender: Melburn Hake, HOYT Weeks in Treatment: 0 Activities of Daily Living Items Answer Activities of Daily Living (Please select one for each item) Drive Automobile Not Able Take Medications Completely Able Use Telephone Completely Able Care for Appearance Need Assistance Use Toilet Need Assistance Bath / Shower Need Assistance Dress Self Need Assistance Feed Self Completely Able Walk Not Able Get In / Out Bed Need Assistance Housework Need Assistance Prepare Meals Need Assistance Handle Money Need Assistance Shop for Self Need Assistance Electronic Signature(s) Signed: 11/02/2019 4:27:58 PM By: Army Melia Entered By: Army Melia on  11/02/2019 13:39:34 Helder, Kristin W. ([id number]) -------------------------------------------------------------------------------- Education Screening Details Patient Name: Kimberly Roy Date of Service: 11/02/2019 1:15 PM Medical Record Number: [id number] Patient Account Number: [id number] Date of Birth/Sex: May 30, 1931 (86 y.o. F) Treating RN: Army Melia Primary Care Brinson Tozzi: Einar Pheasant Other Clinician: Referring Melondy Blanchard: Einar Pheasant Treating Kealey Kemmer/Extender: Melburn Hake, HOYT Weeks in Treatment: 0 Primary Learner Assessed: Patient Learning Preferences/Education Level/Primary Language Learning Preference: Explanation, Demonstration Highest Education Level: College or Above Preferred Language: English Cognitive Barrier Language Barrier: No Translator Needed: No Memory Deficit: No Emotional Barrier: No Cultural/Religious Beliefs Affecting Medical Care: No Physical Barrier Impaired Vision: No Impaired Hearing: No Decreased Hand dexterity: No Knowledge/Comprehension Knowledge Level: High Comprehension Level: High Ability to understand written instructions: High Ability to understand verbal instructions: High Motivation Anxiety Level: Calm Cooperation: Cooperative Education Importance: Acknowledges Need Interest in Health Problems: Asks Questions Perception: Coherent Willingness to Engage in Self-Management High Activities: Readiness to Engage in Self-Management High Activities: Electronic Signature(s) Signed: 11/02/2019 4:27:58 PM By: Army Melia Entered By: Army Melia on 11/02/2019 13:39:55 Cozzolino, Aryan W. ([id number]) -------------------------------------------------------------------------------- Fall Risk Assessment Details Patient Name: Kimberly Roy Date of Service: 11/02/2019 1:15 PM Medical Record Number: [id number] Patient Account Number: [id number] Date of Birth/Sex: April 04, 1931 (86 y.o. F) Treating RN: Army Melia Primary Care Johann Santone: Einar Pheasant  Other Clinician: Referring Erhard Senske: Einar Pheasant Treating Makaveli Hoard/Extender: Melburn Hake, HOYT Weeks in Treatment: 0 Fall Risk Assessment Items Have you had 2 or more falls in the last 12 monthso 0 No Have you had any fall that resulted in injury in the last 12 monthso 0 No FALLS RISK SCREEN History of falling - immediate or within 3 months 0 No Secondary diagnosis (Do you have 2 or more medical diagnoseso) 0 No Ambulatory aid None/bed rest/wheelchair/nurse 0 Yes Crutches/cane/walker 15 Yes Furniture 0 No Intravenous therapy Access/Saline/Heparin Lock 0 No Gait/Transferring Normal/ bed rest/ wheelchair 0 No Weak (short  steps with or without shuffle, stooped but able to lift head while walking, may seek 10 Yes support from furniture) Impaired (short steps with shuffle, may have difficulty arising from chair, head down, impaired 0 No balance) Mental Status Oriented to own ability 0 No Electronic Signature(s) Signed: 11/02/2019 4:27:58 PM By: Army Melia Entered By: Army Melia on 11/02/2019 13:40:06 Mcginniss, Kaeley W. ([id number]) -------------------------------------------------------------------------------- Nutrition Risk Screening Details Patient Name: Kimberly Roy Date of Service: 11/02/2019 1:15 PM Medical Record Number: [id number] Patient Account Number: [id number] Date of Birth/Sex: 1931-07-24 (86 y.o. F) Treating RN: Army Melia Primary Care Aubreyana Saltz: Einar Pheasant Other Clinician: Referring Prashant Glosser: Einar Pheasant Treating Linkyn Gobin/Extender: Melburn Hake, HOYT Weeks in Treatment: 0 Height (in): 63 Weight (lbs): 190 Body Mass Index (BMI): 33.7 Nutrition Risk Screening Items Score Screening NUTRITION RISK SCREEN: I have an illness or condition that made me change the kind and/or amount of food I eat 0 No I eat fewer than two meals per day 0 No I eat few fruits and vegetables, or milk products 0 No I have three or more drinks of beer, liquor or wine almost every day 0  No I have tooth or mouth problems that make it hard for me to eat 0 No I don't always have enough money to buy the food I need 0 No I eat alone most of the time 0 No I take three or more different prescribed or over-the-counter drugs a day 0 No Without wanting to, I have lost or gained 10 pounds in the last six months 0 No I am not always physically able to shop, cook and/or feed myself 0 No Nutrition Protocols Good Risk Protocol 0 No interventions needed Moderate Risk Protocol High Risk Proctocol Risk Level: Good Risk Score: 0 Electronic Signature(s) Signed: 11/02/2019 4:27:58 PM By: Army Melia Entered By: Army Melia on 11/02/2019 13:40:10

## 2019-11-02 NOTE — Progress Notes (Addendum)
Roy, Kimberly W. ([id number]) Visit Report for 11/02/2019 Allergy List Details Patient Name: Kimberly Roy, Kimberly [facility]. Date of Service: 11/02/2019 1:15 PM Medical Record Number: [id number] Patient Account Number: [id number] Date of Birth/Sex: [birth year] (86 y.o. F) Treating RN: Army Melia Primary Care Pawel Soules: Einar Pheasant Other Clinician: Referring Saul Fabiano: Einar Pheasant Treating Arda Daggs/Extender: STONE III, HOYT Weeks in Treatment: 0 Allergies Active Allergies Biaxin Levaquin dermabond Lyrica Reaction: swelling Sulfa (Sulfonamide Antibiotics) nucynya Reaction: severe constipation Allergy Notes Electronic Signature(s) Signed: 11/02/2019 4:27:58 PM By: Army Melia Entered By: Army Melia on 11/02/2019 13:37:14 Donnan, Yamile W. ([id number]) -------------------------------------------------------------------------------- Arrival Information Details Patient Name: Kimberly Roy Date of Service: 11/02/2019 1:15 PM Medical Record Number: [id number] Patient Account Number: [id number] Date of Birth/Sex: 20-Sep-1930 (86 y.o. F) Treating RN: Montey Hora Primary Care Audelia Knape: Einar Pheasant Other Clinician: Referring Addyson Traub: Einar Pheasant Treating Cheri Ayotte/Extender: Melburn Hake, HOYT Weeks in Treatment: 0 Visit Information Patient Arrived: Wheel Chair Arrival Time: 13:29 Accompanied By: daughter Transfer Assistance: Manual Patient Identification Verified: Yes Secondary Verification Process Completed: Yes History Since Last Visit Added or deleted any medications: No Any new allergies or adverse reactions: No Had a fall or experienced change in activities of daily living that may affect risk of falls: No Signs or symptoms of abuse/neglect since last visito No Hospitalized since last visit: No Implantable device outside of the clinic excluding cellular tissue based products placed in the center since last visit: No Electronic Signature(s) Signed: 11/02/2019 4:01:06 PM By: Lorine Bears RCP, RRT, CHT Entered By: Lorine Bears on 11/02/2019 13:29:33 Connery, Ayliana W. ([id number]) -------------------------------------------------------------------------------- Clinic Level of Care Assessment Details Patient Name: Kimberly [facility], Catherina W. Date of Service: 11/02/2019 1:15 PM Medical Record Number: [id number] Patient Account Number: [id number] Date of Birth/Sex: 05/23/1931 (86 y.o. F) Treating RN: Montey Hora Primary Care Chemika Nightengale: Einar Pheasant Other Clinician: Referring Novie Maggio: Einar Pheasant Treating Galen Malkowski/Extender: Melburn Hake, HOYT Weeks in Treatment: 0 Clinic Level of Care Assessment Items TOOL 1 Quantity Score _0  - Use when EandM and Procedure is performed on INITIAL visit 0 ASSESSMENTS - Nursing Assessment / Reassessment X - General Physical Exam (combine w/ comprehensive assessment (listed just below) when performed on new pt. 1 20 evals) X- 1 25 Comprehensive Assessment (HX, ROS, Risk Assessments, Wounds Hx, etc.) ASSESSMENTS - Wound and Skin Assessment / Reassessment _1  - Dermatologic / Skin Assessment (not related to wound area) 0 ASSESSMENTS - Ostomy and/or Continence Assessment and Care _2  - Incontinence Assessment and Management 0 _3  - 0 Ostomy Care Assessment and Management (repouching, etc.) PROCESS - Coordination of Care X - Simple Patient / Family Education for ongoing care 1 15 _4  - 0 Complex (extensive) Patient / Family Education for ongoing care X- 1 10 Staff obtains Programmer, systems, Records, Test Results / Process Orders _5  - 0 Staff telephones HHA, Nursing Homes / Clarify orders / etc _6  - 0 Routine Transfer to another Facility (non-emergent condition) _7  - 0 Routine Hospital Admission (non-emergent condition) X- 1 15 New Admissions / Biomedical engineer / Ordering NPWT, Apligraf, etc. _8  - 0 Emergency Hospital Admission (emergent condition) PROCESS - Special Needs _9  - Pediatric / Minor Patient Management 0 _10  - 0 Isolation  Patient Management _11  - 0 Hearing / Language / Visual special needs _12  - 0 Assessment of Community assistance (transportation, D/C planning, etc.) _13  - 0 Additional assistance / Altered mentation _14  - 0 Support Surface(s) Assessment (bed, cushion, seat, etc.) INTERVENTIONS - Miscellaneous _15  - External ear exam 0 _16  - 0 Patient Transfer (multiple  staff / Harrel Lemon Lift / Similar devices) _0  - 0 Simple Staple / Suture removal (25 or less) _1  - 0 Complex Staple / Suture removal (26 or more) _2  - 0 Hypo/Hyperglycemic Management (do not check if billed separately) Aigner, Delrae W. ([id number]) _3  - 0 Ankle / Brachial Index (ABI) - do not check if billed separately Has the patient been seen at the hospital within the last three years: Yes Total Score: 85 Level Of Care: New/Established - Level 3 Electronic Signature(s) Signed: 11/02/2019 4:45:34 PM By: Montey Hora Entered By: Montey Hora on 11/02/2019 14:27:58 Reffett, Sachiko W. ([id number]) -------------------------------------------------------------------------------- Encounter Discharge Information Details Patient Name: Kimberly Roy. Date of Service: 11/02/2019 1:15 PM Medical Record Number: [id number] Patient Account Number: [id number] Date of Birth/Sex: Jan 03, 1931 (86 y.o. F) Treating RN: Montey Hora Primary Care Guliana Weyandt: Einar Pheasant Other Clinician: Referring Lyfe Reihl: Einar Pheasant Treating Anisa Leanos/Extender: Melburn Hake, HOYT Weeks in Treatment: 0 Encounter Discharge Information Items Post Procedure Vitals Discharge Condition: Stable Temperature (F): 99.1 Ambulatory Status: Wheelchair Pulse (bpm): 64 Discharge Destination: Home Respiratory Rate (breaths/min): 16 Transportation: Private Auto Blood Pressure (mmHg): 158/54 Accompanied By: daughter Schedule Follow-up Appointment: Yes Clinical Summary of Care: Electronic Signature(s) Signed: 11/02/2019 4:45:34 PM By: Montey Hora Entered By: Montey Hora on 11/02/2019  14:30:07 Waren, Jannice W. ([id number]) -------------------------------------------------------------------------------- Lower Extremity Assessment Details Patient Name: Kimberly Roy Date of Service: 11/02/2019 1:15 PM Medical Record Number: [id number] Patient Account Number: [id number] Date of Birth/Sex: 1931/01/12 (86 y.o. F) Treating RN: Army Melia Primary Care Sim Choquette: Einar Pheasant Other Clinician: Referring Steffani Dionisio: Einar Pheasant Treating Ellyn Rubiano/Extender: Melburn Hake, HOYT Weeks in Treatment: 0 Electronic Signature(s) Signed: 11/02/2019 4:27:58 PM By: Army Melia Entered By: Army Melia on 11/02/2019 13:40:22 [place], [place] ([id number]) -------------------------------------------------------------------------------- Multi Wound Chart Details Patient Name: Kimberly Roy Date of Service: 11/02/2019 1:15 PM Medical Record Number: [id number] Patient Account Number: [id number] Date of Birth/Sex: 08-29-1931 (86 y.o. F) Treating RN: Montey Hora Primary Care Rickey Sadowski: Einar Pheasant Other Clinician: Referring Jacquelyn Antony: Einar Pheasant Treating Amante Fomby/Extender: Melburn Hake, HOYT Weeks in Treatment: 0 Vital Signs Height(in): 63 Pulse(bpm): 31 Weight(lbs): 190 Blood Pressure(mmHg): 158/54 Body Mass Index(BMI): 34 Temperature(F): 99.1 Respiratory Rate(breaths/min): 18 Photos: [N/A:N/A] Wound Location: Right Gluteus N/A N/A Wounding Event: Gradually Appeared N/A N/A Primary Etiology: Pressure Ulcer N/A N/A Comorbid History: Cataracts, Sleep Apnea, N/A N/A Hypertension, Osteoarthritis Date Acquired: 10/18/2019 N/A N/A Weeks of Treatment: 0 N/A N/A Wound Status: Open N/A N/A Measurements L x W x D (cm) 3x2x0.1 N/A N/A Area (cm) : 4.712 N/A N/A Volume (cm) : 0.471 N/A N/A Classification: Category/Stage II N/A N/A Exudate Amount: Medium N/A N/A Exudate Type: Serosanguineous N/A N/A Exudate Color: red, brown N/A N/A Wound Margin: Flat and Intact N/A N/A Granulation Amount:  Large (67-100%) N/A N/A Granulation Quality: Red N/A N/A Necrotic Amount: Small (1-33%) N/A N/A Necrotic Tissue: Eschar N/A N/A Exposed Structures: Fat Layer (Subcutaneous Tissue) N/A N/A Exposed: Yes Fascia: No Tendon: No Muscle: No Joint: No Bone: No Epithelialization: Medium (34-66%) N/A N/A Debridement: Debridement - Selective/Open N/A N/A Wound Pre-procedure Verification/Time 14:23 N/A N/A Out Taken: Pain Control: Lidocaine 4% Topical Solution N/A N/A Level: Skin/Epidermis N/A N/A Debridement Area (sq cm): 0.25 N/A N/A Instrument: Forceps, Scissors N/A N/A Bleeding: Minimum N/A N/A Hemostasis Achieved: Pressure N/A N/A Jeschke, Gabryel W. ([id number]) Procedural Pain: 0 N/A N/A Post Procedural Pain: 0 N/A N/A Debridement Treatment Procedure was tolerated well N/A N/A Response: Post Debridement Measurements 3x2x0.1 N/A N/A L x W x D (cm) Post  Debridement Volume: (cm) 0.471 N/A N/A Post Debridement Stage: Category/Stage II N/A N/A Procedures Performed: Debridement N/A N/A Treatment Notes Electronic Signature(s) Signed: 11/02/2019 4:45:34 PM By: Montey Hora Entered By: Montey Hora on 11/02/2019 14:29:30 Kindley, Jhoanna W. ([id number]) -------------------------------------------------------------------------------- Multi-Disciplinary Care Plan Details Patient Name: Kimberly Roy. Date of Service: 11/02/2019 1:15 PM Medical Record Number: [id number] Patient Account Number: [id number] Date of Birth/Sex: [birth year] (86 y.o. F) Treating RN: Montey Hora Primary Care Alexie Samson: Einar Pheasant Other Clinician: Referring Paisely Brick: Einar Pheasant Treating Kallen Mccrystal/Extender: Melburn Hake, HOYT Weeks in Treatment: 0 Active Inactive Abuse / Safety / Falls / Self Care Management Nursing Diagnoses: Potential for falls Goals: Patient will not experience any injury related to falls Date Initiated: 11/02/2019 Target Resolution Date: 02/05/2020 Goal Status: Active Interventions: Assess fall  risk on admission and as needed Notes: Orientation to the Wound Care Program Nursing Diagnoses: Knowledge deficit related to the wound healing center program Goals: Patient/caregiver will verbalize understanding of the Reading Program Date Initiated: 11/02/2019 Target Resolution Date: 02/05/2020 Goal Status: Active Interventions: Provide education on orientation to the wound center Notes: Pressure Nursing Diagnoses: Knowledge deficit related to causes and risk factors for pressure ulcer development Goals: Patient will remain free from development of additional pressure ulcers Date Initiated: 11/02/2019 Target Resolution Date: 02/05/2020 Goal Status: Active Interventions: Assess potential for pressure ulcer upon admission and as needed Notes: Wound/Skin Impairment Nursing Diagnoses: Impaired tissue integrity Hole, Rheannon W. ([id number]) Goals: Ulcer/skin breakdown will heal within 14 weeks Date Initiated: 11/02/2019 Target Resolution Date: 02/05/2020 Goal Status: Active Interventions: Assess patient/caregiver ability to obtain necessary supplies Assess patient/caregiver ability to perform ulcer/skin care regimen upon admission and as needed Assess ulceration(s) every visit Notes: Electronic Signature(s) Signed: 11/02/2019 4:45:34 PM By: Montey Hora Entered By: Montey Hora on 11/02/2019 14:31:59 Rohlfs, Teneshia W. ([id number]) -------------------------------------------------------------------------------- Pain Assessment Details Patient Name: Kimberly Roy Date of Service: 11/02/2019 1:15 PM Medical Record Number: [id number] Patient Account Number: [id number] Date of Birth/Sex: Jun 21, 1931 (86 y.o. F) Treating RN: Montey Hora Primary Care Luvia Orzechowski: Einar Pheasant Other Clinician: Referring Edric Fetterman: Einar Pheasant Treating Simran Mannis/Extender: Melburn Hake, HOYT Weeks in Treatment: 0 Active Problems Location of Pain Severity and Description of Pain Patient Has Paino  Yes Site Locations Rate the pain. Current Pain Level: 10 Pain Management and Medication Current Pain Management: Electronic Signature(s) Signed: 11/02/2019 4:01:06 PM By: Lorine Bears RCP, RRT, CHT Signed: 11/02/2019 4:45:34 PM By: Montey Hora Entered By: Lorine Bears on 11/02/2019 13:29:49 Mucha, Nyiesha W. ([id number]) -------------------------------------------------------------------------------- Patient/Caregiver Education Details Patient Name: Kimberly Roy Date of Service: 11/02/2019 1:15 PM Medical Record Number: [id number] Patient Account Number: [id number] Date of Birth/Gender: October 08, 1930 (86 y.o. F) Treating RN: Montey Hora Primary Care Physician: Einar Pheasant Other Clinician: Referring Physician: Einar Pheasant Treating Physician/Extender: Sharalyn Ink in Treatment: 0 Education Assessment Education Provided To: Patient and Caregiver Education Topics Provided Pressure: Handouts: Other: pressure relief Methods: Explain/Verbal Responses: State content correctly Wound/Skin Impairment: Handouts: Other: wound care asordered Methods: Demonstration, Explain/Verbal Responses: State content correctly Electronic Signature(s) Signed: 11/02/2019 4:45:34 PM By: Montey Hora Entered By: Montey Hora on 11/02/2019 14:28:33 Kentner, Macon W. ([id number]) -------------------------------------------------------------------------------- Wound Assessment Details Patient Name: Kimberly Roy Date of Service: 11/02/2019 1:15 PM Medical Record Number: [id number] Patient Account Number: [id number] Date of Birth/Sex: 1931/08/06 (86 y.o. F) Treating RN: Montey Hora Primary Care Jaylan Duggar: Einar Pheasant Other Clinician: Referring Sula Fetterly: Einar Pheasant Treating Boe Deans/Extender: STONE III, HOYT Weeks in Treatment: 0 Wound Status Wound Number: 4 Primary  Etiology: Pressure Ulcer Wound Location: Right Gluteus Wound Status: Open Wounding Event:  Gradually Appeared Comorbid History: Cataracts, Sleep Apnea, Hypertension, Osteoarthritis Date Acquired: 10/18/2019 Weeks Of Treatment: 0 Clustered Wound: No Photos Wound Measurements Length: (cm) 3 % Reduct Width: (cm) 2 % Reduct Depth: (cm) 0.1 Epitheli Area: (cm) 4.712 Tunneli Volume: (cm) 0.471 Undermi ion in Area: 0% ion in Volume: 0% alization: Medium (34-66%) ng: No ning: No Wound Description Classification: Category/Stage II Foul Odo Wound Margin: Flat and Intact Slough/F Exudate Amount: Medium Exudate Type: Serosanguineous Exudate Color: red, brown r After Cleansing: No ibrino Yes Wound Bed Granulation Amount: None Present (0%) Exposed Structure Necrotic Amount: None Present (0%) Fascia Exposed: No Fat Layer (Subcutaneous Tissue) Exposed: No Tendon Exposed: No Muscle Exposed: No Joint Exposed: No Bone Exposed: No Limited to Skin Breakdown Treatment Notes Wound #4 (Right Gluteus) Notes prisma, BFD Electronic Signature(s) Burkitt, Debbe W. ([id number]) Signed: 11/02/2019 5:46:36 PM By: Worthy Keeler PA-C Signed: 11/03/2019 4:27:26 PM By: Montey Hora Previous Signature: 11/02/2019 4:27:58 PM Version By: Army Melia Entered By: Worthy Keeler on 11/02/2019 17:46:36 Lindon, Calah W. ([id number]) -------------------------------------------------------------------------------- Vitals Details Patient Name: Kimberly Roy Date of Service: 11/02/2019 1:15 PM Medical Record Number: [id number] Patient Account Number: [id number] Date of Birth/Sex: [birth year] (86 y.o. F) Treating RN: Montey Hora Primary Care Tysean V[place]: Einar Pheasant Other Clinician: Referring Darriana Deboy: Einar Pheasant Treating Elasia Furnish/Extender: Melburn Hake, HOYT Weeks in Treatment: 0 Vital Signs Time Taken: 13:25 Temperature (F): 99.1 Height (in): 63 Pulse (bpm): 64 Source: Stated Respiratory Rate (breaths/min): 18 Weight (lbs): 190 Blood Pressure (mmHg): 158/54 Source: Stated Reference Range: 80  - 120 mg / dl Body Mass Index (BMI): 33.7 Electronic Signature(s) Signed: 11/02/2019 4:01:06 PM By: Lorine Bears RCP, RRT, CHT Entered By: Lorine Bears on 11/02/2019 13:31:05

## 2019-11-03 ENCOUNTER — Ambulatory Visit: Payer: PPO

## 2019-11-03 DIAGNOSIS — U071 COVID-19: Secondary | ICD-10-CM

## 2019-11-03 DIAGNOSIS — L89310 Pressure ulcer of right buttock, unstageable: Secondary | ICD-10-CM | POA: Diagnosis not present

## 2019-11-03 NOTE — Progress Notes (Signed)
Patient dtr [name]) came in office today with Maryln Gottron (pt son). Debbied stated that pt started the zpak today and took the first 2 pills during breakfast. Pt has been able to tolerate the zpak so far. Confirmed with [name] that pt has been taking the Keflex as originally prescribed.

## 2019-11-05 ENCOUNTER — Telehealth: Payer: Self-pay | Admitting: *Deleted

## 2019-11-05 ENCOUNTER — Other Ambulatory Visit: Payer: Self-pay | Admitting: Internal Medicine

## 2019-11-05 DIAGNOSIS — D649 Anemia, unspecified: Secondary | ICD-10-CM | POA: Diagnosis not present

## 2019-11-05 DIAGNOSIS — E785 Hyperlipidemia, unspecified: Secondary | ICD-10-CM | POA: Diagnosis not present

## 2019-11-05 DIAGNOSIS — E875 Hyperkalemia: Secondary | ICD-10-CM

## 2019-11-05 NOTE — Telephone Encounter (Signed)
-----  Message from Einar Pheasant, MD sent at 11/05/2019  5:58 AM EST ----- Received outside labs.  Her potassium is slightly increased.  Please confirm not taking any potassium supplements or using an increased amount of salt substitutes.  Need to recheck potassium within the next few days.  Hgb slightly decreased, but overall relatively stable.  Will follow.  Notify her to have stat lab drawn at [facility]

## 2019-11-05 NOTE — Progress Notes (Unsigned)
Order placed for f/u labs.

## 2019-11-05 NOTE — Telephone Encounter (Signed)
See result note from 11/05/19

## 2019-11-05 NOTE — Telephone Encounter (Signed)
Called patient concerning labs left message to return call to office.

## 2019-11-05 NOTE — Progress Notes (Signed)
Bevans, Alene W. ([id number]) Visit Report for 11/02/2019 Chief Complaint Document Details Patient Name: Kimberly Roy, Kimberly Roy [facility] Date of Service: 11/02/2019 1:15 PM Medical Record Number: [id number] Patient Account Number: [id number] Date of Birth/Sex: [birth year] (86 y.o. F) Treating RN: Montey Hora Primary Care Provider: Einar Pheasant Other Clinician: Referring Provider: Einar Pheasant Treating Provider/Extender: Melburn Hake, Aleria Maheu Weeks in Treatment: 0 Information Obtained from: Patient Chief Complaint Pressure ulcer right buttock Electronic Signature(s) Signed: 11/02/2019 1:47:45 PM By: Worthy Keeler PA-C Entered By: Worthy Keeler on 11/02/2019 13:47:45 Kimberly Roy, Kimberly W. ([id number]) -------------------------------------------------------------------------------- Debridement Details Patient Name: Kimberly Roy Date of Service: 11/02/2019 1:15 PM Medical Record Number: [id number] Patient Account Number: [id number] Date of Birth/Sex: Sep 04, 1930 (86 y.o. F) Treating RN: Montey Hora Primary Care Provider: Einar Pheasant Other Clinician: Referring Provider: Einar Pheasant Treating Provider/Extender: Melburn Hake, Crandall Harvel Weeks in Treatment: 0 Debridement Performed for Wound #4 Right Gluteus Assessment: Performed By: Physician STONE III, Roel Douthat E., PA-C Debridement Type: Debridement Level of Consciousness (Pre- Awake and Alert procedure): Pre-procedure Verification/Time Out Yes - 14:23 Taken: Start Time: 14:23 Pain Control: Lidocaine 4% Topical Solution Total Area Debrided (L x W): 0.5 (cm) x 0.5 (cm) = 0.25 (cm) Tissue and other material debrided: Non-Viable, Skin: Epidermis Level: Skin/Epidermis Debridement Description: Selective/Open Wound Instrument: Forceps, Scissors Bleeding: Minimum Hemostasis Achieved: Pressure End Time: 14:24 Procedural Pain: 0 Post Procedural Pain: 0 Response to Treatment: Procedure was tolerated well Level of Consciousness (Post- Awake and Alert procedure): Post  Debridement Measurements of Total Wound Length: (cm) 3 Stage: Category/Stage II Width: (cm) 2 Depth: (cm) 0.1 Volume: (cm) 0.471 Character of Wound/Ulcer Post Debridement: Improved Post Procedure Diagnosis Same as Pre-procedure Electronic Signature(s) Signed: 11/02/2019 4:45:34 PM By: Montey Hora Signed: 11/04/2019 5:37:52 PM By: Worthy Keeler PA-C Entered By: Montey Hora on 11/02/2019 14:27:43 Kimberly Roy, Kimberly W. ([id number]) -------------------------------------------------------------------------------- HPI Details Patient Name: Kimberly Roy Date of Service: 11/02/2019 1:15 PM Medical Record Number: [id number] Patient Account Number: [id number] Date of Birth/Sex: Aug 09, 1931 (86 y.o. F) Treating RN: Montey Hora Primary Care Provider: Einar Pheasant Other Clinician: Referring Provider: Einar Pheasant Treating Provider/Extender: Melburn Hake, Aldair Rickel Weeks in Treatment: 0 History of Present Illness HPI Description: 11/02/2019 upon evaluation today patient actually presents for initial evaluation here in our clinic concerning issues she has with a pressure ulcer of the right gluteal region which fortunately appears to be somewhat superficial but nonetheless is causing her a lot of discomfort. She does have a history of hypertension and sleep apnea. With that being said the patient's wound unfortunately seems to be having of occurred as a result of pressure. The patient is not really able to lay flat and therefore spends a lot of time having to sit. This is due to issues with her back I do believe. Nonetheless I think this has been a big issue as far as trying to get the area to effectively heal. The do note that she is having a lot of pain about the a.m. in the patient and her daughter who helps to care for her as well. With that being said the fortunate thing is I see no signs of infection at this point which is great news. No fevers, chills, nausea, vomiting, or diarrhea. Electronic  Signature(s) Signed: 11/02/2019 5:45:52 PM By: Worthy Keeler PA-C Entered By: Worthy Keeler on 11/02/2019 17:45:52 Kimberly Roy, Kimberly W. ([id number]) -------------------------------------------------------------------------------- Physical Exam Details Patient Name: Kimberly Roy Date of Service: 11/02/2019 1:15 PM Medical Record Number: [id number] Patient Account Number: [id number] Date  of Birth/Sex: [birth year] (86 y.o. F) Treating RN: Montey Hora Primary Care Provider: Einar Pheasant Other Clinician: Referring Provider: Einar Pheasant Treating Provider/Extender: Melburn Hake, Bailley Guilford Weeks in Treatment: 0 Constitutional patient is hypertensive.. pulse regular and within target range for patient.Marland Kitchen respirations regular, non-labored and within target range for patient.Marland Kitchen temperature within target range for patient.. Obese and well-hydrated in no acute distress. Eyes conjunctiva clear no eyelid edema noted. pupils equal round and reactive to light and accommodation. Ears, Nose, Mouth, and Throat no gross abnormality of ear auricles or external auditory canals. normal hearing noted during conversation. mucus membranes moist. Respiratory normal breathing without difficulty. clear to auscultation bilaterally. Cardiovascular trace pitting edema of the bilateral lower extremities. Musculoskeletal Patient unable to walk without assistance. Psychiatric this patient is able to make decisions and demonstrates good insight into disease process. Alert and Oriented x 3. pleasant and cooperative. Notes Upon inspection today the patient's wound bed actually showed signs of good epithelization at this point. Fortunately there was no evidence of breakdown to the fat layer of tissue here. I think this is a much more superficial wound which is excellent news. I believe this bodes well for healing especially if we can keep pressure off of the region. I did explain to the patient and her daughter as well today that I  do believe that we will be able to effectively treat this the main issue is good to be keeping pressure off of the region to be perfectly honest. The more that she is able to mitigate this the better she is going to do. I explained ways in which they can work on trying to attempt this more effectively. Part of that is going to be offloading and since she is having trouble being able to lay in the bed that is ketamine repositioning as much as she can may be using a pillow under 1 gluteal region or the other to offload and potentially getting up and moving around a little bit more. Fortunately the patient seems to favor her right side when she does lie down. Electronic Signature(s) Signed: 11/02/2019 5:48:07 PM By: Worthy Keeler PA-C Entered By: Worthy Keeler on 11/02/2019 17:48:07 Kimberly Roy, Kimberly W. ([id number]) -------------------------------------------------------------------------------- Physician Orders Details Patient Name: Kimberly Roy Date of Service: 11/02/2019 1:15 PM Medical Record Number: [id number] Patient Account Number: [id number] Date of Birth/Sex: [birth year] (86 y.o. F) Treating RN: Montey Hora Primary Care Provider: Einar Pheasant Other Clinician: Referring Provider: Einar Pheasant Treating Provider/Extender: Melburn Hake, Mads Borgmeyer Weeks in Treatment: 0 Verbal / Phone Orders: No Diagnosis Coding ICD-10 Coding Code Description L89.312 Pressure ulcer of right buttock, stage 2 I10 Essential (primary) hypertension G47.30 Sleep apnea, unspecified Wound Cleansing Wound #4 Right Gluteus o Clean wound with Normal Saline. o May Shower, gently pat wound dry prior to applying new dressing. Skin Barriers/Peri-Wound Care Wound #4 Right Gluteus o Skin Prep Primary Wound Dressing Wound #4 Right Gluteus o Silver Collagen - moisten with hydrogel or [place] Jelly Secondary Dressing Wound #4 Right Gluteus o Boardered Foam Dressing Dressing Change Frequency Wound #4 Right  Gluteus o Change dressing every other day. Follow-up Appointments o Return Appointment in 1 week. Off-Loading o Turn and reposition every 2 hours Electronic Signature(s) Signed: 11/02/2019 4:45:34 PM By: Montey Hora Signed: 11/04/2019 5:37:52 PM By: Worthy Keeler PA-C Entered By: Montey Hora on 11/02/2019 14:29:12 Kimberly Roy, Kimberly W. ([id number]) -------------------------------------------------------------------------------- Problem List Details Patient Name: Kimberly Roy Date of Service: 11/02/2019 1:15 PM Medical Record Number: [id number] Patient Account  Number: [id number] Date of Birth/Sex: 12-27-1930 (86 y.o. F) Treating RN: Montey Hora Primary Care Provider: Einar Pheasant Other Clinician: Referring Provider: Einar Pheasant Treating Provider/Extender: Melburn Hake, Claudina Oliphant Weeks in Treatment: 0 Active Problems ICD-10 Evaluated Encounter Code Description Active Date Today Diagnosis L89.312 Pressure ulcer of right buttock, stage 2 11/02/2019 No Yes I10 Essential (primary) hypertension 11/02/2019 No Yes G47.30 Sleep apnea, unspecified 11/02/2019 No Yes Inactive Problems Resolved Problems Electronic Signature(s) Signed: 11/02/2019 1:47:12 PM By: Worthy Keeler PA-C Entered By: Worthy Keeler on 11/02/2019 13:47:11 Kimberly Roy, Kimberly W. ([id number]) -------------------------------------------------------------------------------- Progress Note Details Patient Name: Kimberly Roy Date of Service: 11/02/2019 1:15 PM Medical Record Number: [id number] Patient Account Number: [id number] Date of Birth/Sex: [birth year] (86 y.o. F) Treating RN: Montey Hora Primary Care Provider: Einar Pheasant Other Clinician: Referring Provider: Einar Pheasant Treating Provider/Extender: Melburn Hake, Rashay Barnette Weeks in Treatment: 0 Subjective Chief Complaint Information obtained from Patient Pressure ulcer right buttock History of Present Illness (HPI) 11/02/2019 upon evaluation today patient actually presents for  initial evaluation here in our clinic concerning issues she has with a pressure ulcer of the right gluteal region which fortunately appears to be somewhat superficial but nonetheless is causing her a lot of discomfort. She does have a history of hypertension and sleep apnea. With that being said the patient's wound unfortunately seems to be having of occurred as a result of pressure. The patient is not really able to lay flat and therefore spends a lot of time having to sit. This is due to issues with her back I do believe. Nonetheless I think this has been a big issue as far as trying to get the area to effectively heal. The do note that she is having a lot of pain about the a.m. in the patient and her daughter who helps to care for her as well. With that being said the fortunate thing is I see no signs of infection at this point which is great news. No fevers, chills, nausea, vomiting, or diarrhea. Patient History Information obtained from Patient. Allergies Biaxin, Levaquin, dermabond, Lyrica (Reaction: swelling), Sulfa (Sulfonamide Antibiotics), nucynya (Reaction: severe constipation) Family History Heart Disease - Father, Hypertension - Mother, Stroke - Mother, No family history of Cancer, Diabetes, Kidney Disease, Lung Disease, Seizures, Thyroid Problems. Social History Never smoker, Marital Status - Married, Alcohol Use - Never, Drug Use - No History, Caffeine Use - Never. Medical History Eyes Patient has history of Cataracts - surgery Denies history of Glaucoma, Optic Neuritis Respiratory Patient has history of Sleep Apnea - cpap Denies history of Aspiration, Asthma, Chronic Obstructive Pulmonary Disease (COPD), Pneumothorax, Tuberculosis Cardiovascular Patient has history of Hypertension - controlled with medication Denies history of Angina, Arrhythmia, Congestive Heart Failure, Coronary Artery Disease, Deep Vein Thrombosis, Hypotension, Myocardial Infarction, Peripheral Arterial  Disease, Peripheral Venous Disease, Phlebitis, Vasculitis Gastrointestinal Denies history of Cirrhosis , Colitis, Crohn s, Hepatitis A, Hepatitis B, Hepatitis C Endocrine Denies history of Type I Diabetes, Type II Diabetes Integumentary (Skin) Denies history of History of Burn Musculoskeletal Patient has history of Osteoarthritis Denies history of Gout, Rheumatoid Arthritis, Osteomyelitis Oncologic Denies history of Received Chemotherapy, Received Radiation Medical And Surgical History Notes Constitutional Symptoms (General Health) broken hip, 5 back surgeries, knee surgery, rotator cuff Review of Systems (ROS) Constitutional Symptoms (General Health) Denies complaints or symptoms of Fatigue, Fever, Chills, Marked Weight Change. Kimberly Roy, Kimberly W. ([id number]) Eyes Denies complaints or symptoms of Dry Eyes, Vision Changes, Glasses / Contacts. Ear/Nose/Mouth/Throat Denies complaints or symptoms of Difficult clearing  ears, Sinusitis. Hematologic/Lymphatic Denies complaints or symptoms of Bleeding / Clotting Disorders, Human Immunodeficiency Virus. Respiratory Denies complaints or symptoms of Chronic or frequent coughs, Shortness of Breath. Cardiovascular Denies complaints or symptoms of Chest pain, LE edema. Gastrointestinal Denies complaints or symptoms of Frequent diarrhea, Nausea, Vomiting. Endocrine Complains or has symptoms of Thyroid disease. Denies complaints or symptoms of Hepatitis, Polydypsia (Excessive Thirst). Genitourinary Denies complaints or symptoms of Kidney failure/ Dialysis, Incontinence/dribbling. Immunological Denies complaints or symptoms of Hives, Itching. Integumentary (Skin) Complains or has symptoms of Wounds. Denies complaints or symptoms of Bleeding or bruising tendency, Breakdown, Swelling. Musculoskeletal Denies complaints or symptoms of Muscle Pain, Muscle Weakness. Neurologic Denies complaints or symptoms of Numbness/parasthesias,  Focal/Weakness. Psychiatric Denies complaints or symptoms of Anxiety, Claustrophobia. Objective Constitutional patient is hypertensive.. pulse regular and within target range for patient.Marland Kitchen respirations regular, non-labored and within target range for patient.Marland Kitchen temperature within target range for patient.. Obese and well-hydrated in no acute distress. Vitals Time Taken: 1:25 PM, Height: 63 in, Source: Stated, Weight: 190 lbs, Source: Stated, BMI: 33.7, Temperature: 99.1 F, Pulse: 64 bpm, Respiratory Rate: 18 breaths/min, Blood Pressure: 158/54 mmHg. Eyes conjunctiva clear no eyelid edema noted. pupils equal round and reactive to light and accommodation. Ears, Nose, Mouth, and Throat no gross abnormality of ear auricles or external auditory canals. normal hearing noted during conversation. mucus membranes moist. Respiratory normal breathing without difficulty. clear to auscultation bilaterally. Cardiovascular trace pitting edema of the bilateral lower extremities. Musculoskeletal Patient unable to walk without assistance. Psychiatric this patient is able to make decisions and demonstrates good insight into disease process. Alert and Oriented x 3. pleasant and cooperative. General Notes: Upon inspection today the patient's wound bed actually showed signs of good epithelization at this point. Fortunately there was no evidence of breakdown to the fat layer of tissue here. I think this is a much more superficial wound which is excellent news. I believe this bodes well for healing especially if we can keep pressure off of the region. I did explain to the patient and her daughter as well today that I do believe that we will be able to effectively treat this the main issue is good to be keeping pressure off of the region to be perfectly honest. The more that she is able to mitigate this the better she is going to do. I explained ways in which they can work on trying to attempt this more  effectively. Part of that is going to be offloading Fotheringham, Laural W. ([id number]) and since she is having trouble being able to lay in the bed that is ketamine repositioning as much as she can may be using a pillow under 1 gluteal region or the other to offload and potentially getting up and moving around a little bit more. Fortunately the patient seems to favor her right side when she does lie down. Integumentary (Hair, Skin) Wound #4 status is Open. Original cause of wound was Gradually Appeared. The wound is located on the Right Gluteus. The wound measures 3cm length x 2cm width x 0.1cm depth; 4.712cm^2 area and 0.471cm^3 volume. The wound is limited to skin breakdown. There is no tunneling or undermining noted. There is a medium amount of serosanguineous drainage noted. The wound margin is flat and intact. There is no granulation within the wound bed. There is no necrotic tissue within the wound bed. Assessment Active Problems ICD-10 Pressure ulcer of right buttock, stage 2 Essential (primary) hypertension Sleep apnea, unspecified Procedures Wound #4 Pre-procedure diagnosis of  Wound #4 is a Pressure Ulcer located on the Right Gluteus . There was a Selective/Open Wound Skin/Epidermis Debridement with a total area of 0.25 sq cm performed by STONE III, Teria Khachatryan E., PA-C. With the following instrument(s): Forceps, and Scissors to remove Non-Viable tissue/material. Material removed includes Skin: Epidermis after achieving pain control using Lidocaine 4% Topical Solution. No specimens were taken. A time out was conducted at 14:23, prior to the start of the procedure. A Minimum amount of bleeding was controlled with Pressure. The procedure was tolerated well with a pain level of 0 throughout and a pain level of 0 following the procedure. Post Debridement Measurements: 3cm length x 2cm width x 0.1cm depth; 0.471cm^3 volume. Post debridement Stage noted as Category/Stage II. Character of Wound/Ulcer Post  Debridement is improved. Post procedure Diagnosis Wound #4: Same as Pre-Procedure Plan Wound Cleansing: Wound #4 Right Gluteus: Clean wound with Normal Saline. May Shower, gently pat wound dry prior to applying new dressing. Skin Barriers/Peri-Wound Care: Wound #4 Right Gluteus: Skin Prep Primary Wound Dressing: Wound #4 Right Gluteus: Silver Collagen - moisten with hydrogel or [place] Jelly Secondary Dressing: Wound #4 Right Gluteus: Boardered Foam Dressing Dressing Change Frequency: Wound #4 Right Gluteus: Change dressing every other day. Follow-up Appointments: Return Appointment in 1 week. Off-Loading: Turn and reposition every 2 hours Kimberly Roy, Kimberly W. ([id number]) 1. My suggestion at this time is good to be that we focus on appropriate offloading including trying as much as possible get a pillow up underneath the patient's gluteal region to try to offload to one side or the other. Also think trying to move around at least once every 2 hours although ideally every hour for 5 to 10 minutes would be good as far as keeping the blood moving. 2. I am also going to suggest at this point that we try to as much as possible improve the patient from the standpoint of dressings I do believe that a silver collagen dressing would be ideal. I think that we can use hydrogel or K-Y jelly to moisten this and then subsequently use a border foam dressing to protect the region. 3. Outside of this we will continue to monitor for any signs of infection though I do not really see any evidence of infection at this point which is good news. We will see patient back for reevaluation in 1 week here in the clinic. If anything worsens or changes patient will contact our office for additional recommendations. Electronic Signature(s) Signed: 11/02/2019 5:50:06 PM By: Worthy Keeler PA-C Entered By: Worthy Keeler on 11/02/2019 17:50:06 Kimberly Roy, Kimberly W.  ([id number]) -------------------------------------------------------------------------------- ROS/PFSH Details Patient Name: Kimberly Roy Date of Service: 11/02/2019 1:15 PM Medical Record Number: [id number] Patient Account Number: [id number] Date of Birth/Sex: [birth year] (86 y.o. F) Treating RN: Army Melia Primary Care Provider: Einar Pheasant Other Clinician: Referring Provider: Einar Pheasant Treating Provider/Extender: Melburn Hake, Nanetta Wiegman Weeks in Treatment: 0 Information Obtained From Patient Constitutional Symptoms (General Health) Complaints and Symptoms: Negative for: Fatigue; Fever; Chills; Marked Weight Change Medical History: Past Medical History Notes: broken hip, 5 back surgeries, knee surgery, rotator cuff Eyes Complaints and Symptoms: Negative for: Dry Eyes; Vision Changes; Glasses / Contacts Medical History: Positive for: Cataracts - surgery Negative for: Glaucoma; Optic Neuritis Ear/Nose/Mouth/Throat Complaints and Symptoms: Negative for: Difficult clearing ears; Sinusitis Hematologic/Lymphatic Complaints and Symptoms: Negative for: Bleeding / Clotting Disorders; Human Immunodeficiency Virus Respiratory Complaints and Symptoms: Negative for: Chronic or frequent coughs; Shortness of Breath Medical History: Positive for: Sleep Apnea -  cpap Negative for: Aspiration; Asthma; Chronic Obstructive Pulmonary Disease (COPD); Pneumothorax; Tuberculosis Cardiovascular Complaints and Symptoms: Negative for: Chest pain; LE edema Medical History: Positive for: Hypertension - controlled with medication Negative for: Angina; Arrhythmia; Congestive Heart Failure; Coronary Artery Disease; Deep Vein Thrombosis; Hypotension; Myocardial Infarction; Peripheral Arterial Disease; Peripheral Venous Disease; Phlebitis; Vasculitis Gastrointestinal Complaints and Symptoms: Negative for: Frequent diarrhea; Nausea; Vomiting Medical History: Negative for: Cirrhosis ; Colitis; Crohnos;  Hepatitis A; Hepatitis B; Hepatitis C Endocrine Italiano, Jerline W. ([id number]) Complaints and Symptoms: Positive for: Thyroid disease Negative for: Hepatitis; Polydypsia (Excessive Thirst) Medical History: Negative for: Type I Diabetes; Type II Diabetes Genitourinary Complaints and Symptoms: Negative for: Kidney failure/ Dialysis; Incontinence/dribbling Immunological Complaints and Symptoms: Negative for: Hives; Itching Integumentary (Skin) Complaints and Symptoms: Positive for: Wounds Negative for: Bleeding or bruising tendency; Breakdown; Swelling Medical History: Negative for: History of Burn Musculoskeletal Complaints and Symptoms: Negative for: Muscle Pain; Muscle Weakness Medical History: Positive for: Osteoarthritis Negative for: Gout; Rheumatoid Arthritis; Osteomyelitis Neurologic Complaints and Symptoms: Negative for: Numbness/parasthesias; Focal/Weakness Psychiatric Complaints and Symptoms: Negative for: Anxiety; Claustrophobia Oncologic Medical History: Negative for: Received Chemotherapy; Received Radiation HBO Extended History Items Eyes: Cataracts Immunizations Pneumococcal Vaccine: Received Pneumococcal Vaccination: Yes Implantable Devices None Family and Social History Cancer: No; Diabetes: No; Heart Disease: Yes - Father; Hypertension: Yes - Mother; Kidney Disease: No; Lung Disease: No; Seizures: No; Stroke: Yes - Mother; Thyroid Problems: No; Never smoker; Marital Status - Married; Alcohol Use: Never; Drug Use: No History; Caffeine Use: Never; Financial Concerns: No; Food, Clothing or Shelter Needs: No; Support System Lacking: No; Transportation Concerns: No Electronic Signature(s) Kimberly Roy, Kimberly W. ([id number]) Signed: 11/02/2019 4:27:58 PM By: Army Melia Signed: 11/04/2019 5:37:52 PM By: Worthy Keeler PA-C Entered By: Army Melia on 11/02/2019 13:39:10 Kimberly Roy, Ladeana W.  ([id number]) -------------------------------------------------------------------------------- SuperBill Details Patient Name: Kimberly Roy Date of Service: 11/02/2019 Medical Record Number: [id number] Patient Account Number: [id number] Date of Birth/Sex: 1931/08/14 (86 y.o. F) Treating RN: Montey Hora Primary Care Provider: Einar Pheasant Other Clinician: Referring Provider: Einar Pheasant Treating Provider/Extender: Melburn Hake, Tienna Bienkowski Weeks in Treatment: 0 Diagnosis Coding ICD-10 Codes Code Description T97.182 Pressure ulcer of right buttock, stage 2 I10 Essential (primary) hypertension G47.30 Sleep apnea, unspecified Facility Procedures CPT4 Code: [id number] Description: 99213 - WOUND CARE VISIT-LEV 3 EST PT Modifier: Quantity: 1 CPT4 Code: [id number] Description: 53317 - DEBRIDE WOUND 1ST 20 SQ CM OR < Modifier: Quantity: 1 CPT4 Code: Description: ICD-10 Diagnosis Description L89.312 Pressure ulcer of right buttock, stage 2 Modifier: Quantity: Physician Procedures CPT4 Code: 4099278 Description: WC PHYS LEVEL 3 o NEW PT Modifier: 25 Quantity: 1 CPT4 Code: Description: ICD-10 Diagnosis Description S04.471 Pressure ulcer of right buttock, stage 2 I10 Essential (primary) hypertension G47.30 Sleep apnea, unspecified Modifier: Quantity: CPT4 Code: [id number] Description: 97597 - WC PHYS DEBR WO ANESTH 20 SQ CM Modifier: Quantity: 1 CPT4 Code: Description: ICD-10 Diagnosis Description L89.312 Pressure ulcer of right buttock, stage 2 Modifier: Quantity: Electronic Signature(s) Signed: 11/02/2019 5:50:21 PM By: Worthy Keeler PA-C Entered By: Worthy Keeler on 11/02/2019 17:50:21

## 2019-11-07 LAB — BASIC METABOLIC PANEL: Potassium: 4.5 (ref 3.4–5.3)

## 2019-11-07 LAB — CBC: RBC: 3.18 — AB (ref 3.87–5.11)

## 2019-11-07 LAB — CBC AND DIFFERENTIAL
HCT: 31 — AB (ref 36–46)
Hemoglobin: 10.4 — AB (ref 12.0–16.0)
Neutrophils Absolute: 11
Platelets: 325 (ref 150–399)
WBC: 14.4

## 2019-11-08 DIAGNOSIS — N390 Urinary tract infection, site not specified: Secondary | ICD-10-CM | POA: Diagnosis not present

## 2019-11-09 ENCOUNTER — Encounter: Payer: PPO | Admitting: Physician Assistant

## 2019-11-09 ENCOUNTER — Other Ambulatory Visit: Payer: Self-pay

## 2019-11-09 DIAGNOSIS — L89319 Pressure ulcer of right buttock, unspecified stage: Secondary | ICD-10-CM | POA: Diagnosis not present

## 2019-11-09 DIAGNOSIS — L89312 Pressure ulcer of right buttock, stage 2: Secondary | ICD-10-CM | POA: Diagnosis not present

## 2019-11-09 NOTE — Progress Notes (Addendum)
Roy, Kimberly W. ([id number]) Visit Report for 11/09/2019 Arrival Information Details Patient Name: Kimberly Roy, Kimberly Roy [facility]. Date of Service: 11/09/2019 2:00 PM Medical Record Number: [id number] Patient Account Number: [id number] Date of Birth/Sex: Nov 30, 1930 (86 y.o. F) Treating RN: Kimberly Roy Primary Care Kimberly Roy: Kimberly Roy Other Clinician: Referring Kimberly Roy: Kimberly Roy Treating Kimberly Roy/Extender: Melburn Hake, Kimberly Roy in Treatment: 1 Visit Information History Since Last Visit Added or deleted any medications: No Patient Arrived: Wheel Chair Any new allergies or adverse reactions: No Arrival Time: 14:18 Had a fall or experienced change in No Accompanied By: daughter, son activities of daily living that may affect Transfer Assistance: None risk of falls: Patient Identification Verified: Yes Signs or symptoms of abuse/neglect since last visito No Secondary Verification Process Completed: Yes Hospitalized since last visit: No Implantable device outside of the clinic excluding No cellular tissue based products placed in the center since last visit: Pain Present Now: Yes Electronic Signature(s) Signed: 11/09/2019 4:21:39 PM By: Kimberly Roy Entered By: Kimberly Bears on 11/09/2019 14:20:15 Kimberly Roy, Kimberly W. ([id number]) -------------------------------------------------------------------------------- Clinic Level of Care Assessment Details Patient Name: [facility], [name] Date of Service: 11/09/2019 2:00 PM Medical Record Number: [id number] Patient Account Number: [id number] Date of Birth/Sex: Jul 15, 1931 (86 y.o. F) Treating RN: Kimberly Roy Primary Care Kimberly Roy: Kimberly Roy Other Clinician: Referring Kimberly Roy: Kimberly Roy Treating Kimberly Roy/Extender: Melburn Hake, Kimberly Roy in Treatment: 1 Clinic Level of Care Assessment Items TOOL 4 Quantity Score _0  - Use when only an EandM is performed on FOLLOW-UP visit 0 ASSESSMENTS - Nursing Assessment  / Reassessment X - Reassessment of Co-morbidities (includes updates in patient status) 1 10 X- 1 5 Reassessment of Adherence to Treatment Plan ASSESSMENTS - Wound and Skin Assessment / Reassessment X - Simple Wound Assessment / Reassessment - one wound 1 5 _1  - 0 Complex Wound Assessment / Reassessment - multiple wounds _2  - 0 Dermatologic / Skin Assessment (not related to wound area) ASSESSMENTS - Focused Assessment _3  - Circumferential Edema Measurements - multi extremities 0 _4  - 0 Nutritional Assessment / Counseling / Intervention _5  - 0 Lower Extremity Assessment (monofilament, tuning fork, pulses) _6  - 0 Peripheral Arterial Disease Assessment (using hand held doppler) ASSESSMENTS - Ostomy and/or Continence Assessment and Care _7  - Incontinence Assessment and Management 0 _8  - 0 Ostomy Care Assessment and Management (repouching, etc.) PROCESS - Coordination of Care X - Simple Patient / Family Education for ongoing care 1 15 _9  - 0 Complex (extensive) Patient / Family Education for ongoing care X- 1 10 Staff obtains Programmer, systems, Records, Test Results / Process Orders _10  - 0 Staff telephones HHA, Nursing Homes / Clarify orders / etc _11  - 0 Routine Transfer to another Facility (non-emergent condition) _12  - 0 Routine Hospital Admission (non-emergent condition) _13  - 0 New Admissions / Biomedical engineer / Ordering NPWT, Apligraf, etc. _14  - 0 Emergency Hospital Admission (emergent condition) X- 1 10 Simple Discharge Coordination _15  - 0 Complex (extensive) Discharge Coordination PROCESS - Special Needs _16  - Pediatric / Minor Patient Management 0 _17  - 0 Isolation Patient Management _18  - 0 Hearing / Language / Visual special needs _19  - 0 Assessment of Community assistance (transportation, D/C planning, etc.) Kimberly Roy, Kimberly W. ([id number]) _20  - 0 Additional assistance / Altered mentation _21  - 0 Support Surface(s) Assessment (bed, cushion, seat, etc.) INTERVENTIONS -  Wound Cleansing / Measurement X - Simple Wound Cleansing - one wound 1 5 _22  - 0 Complex Wound Cleansing - multiple wounds X- 1 5 Wound Imaging (  photographs - any number of wounds) _0  - 0 Wound Tracing (instead of photographs) X- 1 5 Simple Wound Measurement - one wound _1  - 0 Complex Wound Measurement - multiple wounds INTERVENTIONS - Wound Dressings _2  - Small Wound Dressing one or multiple wounds 0 _3  - 0 Medium Wound Dressing one or multiple wounds _4  - 0 Large Wound Dressing one or multiple wounds X- 1 5 Application of Medications - topical <[id number]>_2<\/[id number]>_7  - 0 Application of Medications - injection INTERVENTIONS - Miscellaneous _6  - External ear exam 0 _7  - 0 Specimen Collection (cultures, biopsies, blood, body fluids, etc.) _8  - 0 Specimen(s) / Culture(s) sent or taken to Lab for analysis _9  - 0 Patient Transfer (multiple staff / Civil Service fast streamer / Similar devices) _10  - 0 Simple Staple / Suture removal (25 or less) _11  - 0 Complex Staple / Suture removal (26 or more) _12  - 0 Hypo / Hyperglycemic Management (close monitor of Blood Glucose) _13  - 0 Ankle / Brachial Index (ABI) - do not check if billed separately X- 1 5 Vital Signs Has the patient been seen at the hospital within the last three years: Yes Total Score: 80 Level Of Care: New/Established - Level 3 Electronic Signature(s) Signed: 11/09/2019 4:36:26 PM By: Kimberly Roy Entered By: Kimberly Roy on 11/09/2019 14:49:17 Kimberly Roy, Kimberly W. ([id number]) -------------------------------------------------------------------------------- Encounter Discharge Information Details Patient Name: Kimberly Roy. Date of Service: 11/09/2019 2:00 PM Medical Record Number: [id number] Patient Account Number: [id number] Date of Birth/Sex: 1931-09-02 (86 y.o. F) Treating RN: Kimberly Roy Primary Care Sander Remedios: Kimberly Roy Other Clinician: Referring Averi Kilty: Kimberly Roy Treating Jamaria Amborn/Extender: Melburn Hake, Kimberly Roy in Treatment:  1 Encounter Discharge Information Items Discharge Condition: Stable Ambulatory Status: Wheelchair Discharge Destination: Home Transportation: Private Auto Accompanied By: self Schedule Follow-up Appointment: Yes Clinical Summary of Care: Electronic Signature(s) Signed: 11/09/2019 4:36:26 PM By: Kimberly Roy Entered By: Kimberly Roy on 11/09/2019 14:51:17 Kimberly Roy, Kimberly W. ([id number]) -------------------------------------------------------------------------------- Lower Extremity Assessment Details Patient Name: Kimberly Roy Date of Service: 11/09/2019 2:00 PM Medical Record Number: [id number] Patient Account Number: [id number] Date of Birth/Sex: August 08, 1931 (86 y.o. F) Treating RN: Army Melia Primary Care Saramarie Stinger: Kimberly Roy Other Clinician: Referring Azha Constantin: Kimberly Roy Treating Michala Deblanc/Extender: Melburn Hake, Kimberly Roy in Treatment: 1 Electronic Signature(s) Signed: 11/10/2019 8:09:31 AM By: Army Melia Entered By: Army Melia on 11/09/2019 14:25:56 Pollino, Kortney W. ([id number]) -------------------------------------------------------------------------------- Multi Wound Chart Details Patient Name: Kimberly Roy Date of Service: 11/09/2019 2:00 PM Medical Record Number: [id number] Patient Account Number: [id number] Date of Birth/Sex: 1930-09-27 (86 y.o. F) Treating RN: Kimberly Roy Primary Care Williom Cedar: Kimberly Roy Other Clinician: Referring Karlene Southard: Kimberly Roy Treating Agusta Hackenberg/Extender: Melburn Hake, Kimberly Roy in Treatment: 1 Vital Signs Height(in): 63 Pulse(bpm): 50 Weight(lbs): 190 Blood Pressure(mmHg): 145/55 Body Mass Index(BMI): 34 Temperature(F): 98.8 Respiratory Rate(breaths/min): 18 Photos: [N/A:N/A] Wound Location: Right Gluteus N/A N/A Wounding Event: Gradually Appeared N/A N/A Primary Etiology: Pressure Ulcer N/A N/A Comorbid History: Cataracts, Sleep Apnea, N/A N/A Hypertension, Osteoarthritis Date Acquired: 10/18/2019 N/A N/A Roy of  Treatment: 1 N/A N/A Wound Status: Healed - Epithelialized N/A N/A Measurements L x W x D (cm) 0x0x0 N/A N/A Area (cm) : 0 N/A N/A Volume (cm) : 0 N/A N/A % Reduction in Area: 100.00% N/A N/A % Reduction in Volume: 100.00% N/A N/A Classification: Category/Stage II N/A N/A Exudate Amount: Medium N/A N/A Exudate Type: Serosanguineous N/A N/A Exudate Color: red, brown N/A N/A Wound Margin: Flat and Intact N/A N/A Granulation Amount: None Present (0%) N/A N/A Necrotic Amount:  None Present (0%) N/A N/A Exposed Structures: Fascia: No N/A N/A Fat Layer (Subcutaneous Tissue) Exposed: No Tendon: No Muscle: No Joint: No Bone: No Limited to Skin Breakdown Epithelialization: Medium (34-66%) N/A N/A Treatment Notes Electronic Signature(s) Signed: 11/09/2019 4:36:26 PM By: Kimberly Roy Entered By: Kimberly Roy on 11/09/2019 14:44:43 Kimberly Roy, Kimberly W. ([id number]) Kimberly Roy, Kimberly W. ([id number]) -------------------------------------------------------------------------------- [place] Details Patient Name: Kimberly Roy. Date of Service: 11/09/2019 2:00 PM Medical Record Number: [id number] Patient Account Number: [id number] Date of Birth/Sex: Jul 01, 1931 (86 y.o. F) Treating RN: Kimberly Roy Primary Care Keric Zehren: Kimberly Roy Other Clinician: Referring Rembert Browe: Kimberly Roy Treating Bricia Taher/Extender: Melburn Hake, Kimberly Roy in Treatment: 1 Active Inactive Abuse / Safety / Falls / Self Care Management Nursing Diagnoses: Potential for falls Goals: Patient will not experience any injury related to falls Date Initiated: 11/02/2019 Target Resolution Date: 02/05/2020 Goal Status: Active Interventions: Assess fall risk on admission and as needed Notes: Orientation to the Wound Care Program Nursing Diagnoses: Knowledge deficit related to the wound healing center program Goals: Patient/caregiver will verbalize understanding of the [place] Program Date  Initiated: 11/02/2019 Target Resolution Date: 02/05/2020 Goal Status: Active Interventions: Provide education on orientation to the wound center Notes: Pressure Nursing Diagnoses: Knowledge deficit related to causes and risk factors for pressure ulcer development Goals: Patient will remain free from development of additional pressure ulcers Date Initiated: 11/02/2019 Target Resolution Date: 02/05/2020 Goal Status: Active Interventions: Assess potential for pressure ulcer upon admission and as needed Notes: Electronic Signature(s) Signed: 11/09/2019 4:36:26 PM By: Kimberly Roy Entered By: Kimberly Roy on 11/09/2019 14:44:36 Kimberly Roy, Kimberly W. ([id number]) Kimberly Roy, Kimberly W. ([id number]) -------------------------------------------------------------------------------- Pain Assessment Details Patient Name: Kimberly Roy. Date of Service: 11/09/2019 2:00 PM Medical Record Number: [id number] Patient Account Number: [id number] Date of Birth/Sex: 17-Feb-1931 (86 y.o. F) Treating RN: Army Melia Primary Care Tresia Revolorio: Kimberly Roy Other Clinician: Referring Joshuwa Vecchio: Kimberly Roy Treating Eleyna Brugh/Extender: Melburn Hake, Kimberly Roy in Treatment: 1 Active Problems Location of Pain Severity and Description of Pain Patient Has Paino Yes Site Locations Pain Location: Pain in Ulcers Rate the pain. Current Pain Level: 6 Pain Management and Medication Current Pain Management: Electronic Signature(s) Signed: 11/10/2019 8:09:31 AM By: Army Melia Entered By: Army Melia on 11/09/2019 14:24:52 Kimberly Roy, Kimberly W. ([id number]) -------------------------------------------------------------------------------- Patient/Caregiver Education Details Patient Name: Kimberly Roy Date of Service: 11/09/2019 2:00 PM Medical Record Number: [id number] Patient Account Number: [id number] Date of Birth/Gender: [birth year] (86 y.o. F) Treating RN: Kimberly Roy Primary Care Physician: Kimberly Roy Other Clinician: Referring  Physician: Einar Roy Treating Physician/Extender: Sharalyn Ink in Treatment: 1 Education Assessment Education Provided To: Patient Education Topics Provided Pressure: Methods: Explain/Verbal Responses: State content correctly Electronic Signature(s) Signed: 11/09/2019 4:36:26 PM By: Kimberly Roy Entered By: Kimberly Roy on 11/09/2019 14:49:37 Kimberly Roy, Kimberly W. ([id number]) -------------------------------------------------------------------------------- Wound Assessment Details Patient Name: Kimberly Roy Date of Service: 11/09/2019 2:00 PM Medical Record Number: [id number] Patient Account Number: [id number] Date of Birth/Sex: [birth year] (86 y.o. F) Treating RN: Kimberly Roy Primary Care Taaliyah Delpriore: Kimberly Roy Other Clinician: Referring Ellanora Rayborn: Kimberly Roy Treating Heriberto Stmartin/Extender: Melburn Hake, Kimberly Roy in Treatment: 1 Wound Status Wound Number: 4 Primary Etiology: Pressure Ulcer Wound Location: Right Gluteus Wound Status: Healed - Epithelialized Wounding Event: Gradually Appeared Comorbid History: Cataracts, Sleep Apnea, Hypertension, Osteoarthritis Date Acquired: 10/18/2019 Roy Of Treatment: 1 Clustered Wound: No Photos Wound Measurements Length: (cm) Width: (cm) Depth: (cm) Area: (cm) Volume: (cm) 0 % Reduction in Area: 100% 0 % Reduction in Volume: 100%  0 Epithelialization: Medium (34-66%) 0 0 Wound Description Classification: Category/Stage II Wound Margin: Flat and Intact Exudate Amount: Medium Exudate Type: Serosanguineous Exudate Color: red, brown Foul Odor After Cleansing: No Slough/Fibrino Yes Wound Bed Granulation Amount: None Present (0%) Exposed Structure Necrotic Amount: None Present (0%) Fascia Exposed: No Fat Layer (Subcutaneous Tissue) Exposed: No Tendon Exposed: No Muscle Exposed: No Joint Exposed: No Bone Exposed: No Limited to Skin Breakdown Electronic Signature(s) Signed: 11/09/2019 4:36:26 PM By: Kimberly Roy Entered By: Kimberly Roy on 11/09/2019 14:42:41 Needles, [place] W. ([id number]) -------------------------------------------------------------------------------- [place] Details Patient Name: Kimberly Roy. Date of Service: 11/09/2019 2:00 PM Medical Record Number: [id number] Patient Account Number: [id number] Date of Birth/Sex: 1931/05/29 (86 y.o. F) Treating RN: Kimberly Roy Primary Care Winfred Iiams: Kimberly Roy Other Clinician: Referring Jad Johansson: Kimberly Roy Treating Arbadella Kimbler/Extender: Melburn Hake, Kimberly Roy in Treatment: 1 Vital Signs Time Taken: 14:15 Temperature (F): 98.8 Height (in): 63 Pulse (bpm): 50 Weight (lbs): 190 Respiratory Rate (breaths/min): 18 Body Mass Index (BMI): 33.7 Blood Pressure (mmHg): 145/55 Reference Range: 80 - 120 mg / dl Electronic Signature(s) Signed: 11/09/2019 4:21:39 PM By: Kimberly Roy Entered By: Kimberly Bears on 11/09/2019 14:20:36

## 2019-11-09 NOTE — Progress Notes (Addendum)
Roy, Kimberly W. ([id number]) Visit Report for 11/09/2019 Chief Complaint Document Details Patient Name: Kimberly Roy, Kimberly Roy [facility] W. Date of Service: 11/09/2019 2:00 PM Medical Record Number: [id number] Patient Account Number: [id number] Date of Birth/Sex: [birth year] (86 y.o. F) Treating RN: Montey Hora Primary Care Provider: Einar Pheasant Other Clinician: Referring Provider: Einar Pheasant Treating Provider/Extender: Melburn Hake, Labrandon Knoch Weeks in Treatment: 1 Information Obtained from: Patient Chief Complaint Pressure ulcer right buttock Electronic Signature(s) Signed: 11/09/2019 2:16:35 PM By: Worthy Keeler PA-C Entered By: Worthy Keeler on 11/09/2019 14:16:34 Kimberly Roy, Kimberly W. ([id number]) -------------------------------------------------------------------------------- HPI Details Patient Name: Kimberly Roy Date of Service: 11/09/2019 2:00 PM Medical Record Number: [id number] Patient Account Number: [id number] Date of Birth/Sex: 1931/08/04 (86 y.o. F) Treating RN: Montey Hora Primary Care Provider: Einar Pheasant Other Clinician: Referring Provider: Einar Pheasant Treating Provider/Extender: Melburn Hake, Arturo Freundlich Weeks in Treatment: 1 History of Present Illness HPI Description: 11/02/2019 upon evaluation today patient actually presents for initial evaluation here in our clinic concerning issues she has with a pressure ulcer of the right gluteal region which fortunately appears to be somewhat superficial but nonetheless is causing her a lot of discomfort. She does have a history of hypertension and sleep apnea. With that being said the patient's wound unfortunately seems to be having of occurred as a result of pressure. The patient is not really able to lay flat and therefore spends a lot of time having to sit. This is due to issues with her back I do believe. Nonetheless I think this has been a big issue as far as trying to get the area to effectively heal. The do note that she is having a lot of pain about the  a.m. in the patient and her daughter who helps to care for her as well. With that being said the fortunate thing is I see no signs of infection at this point which is great news. No fevers, chills, nausea, vomiting, or diarrhea. 11/09/2019 upon evaluation today patient appears to be doing very well with regard to the gluteal region. There is no signs of open wounds at this time which is great news. No fevers, chills, nausea, vomiting, or diarrhea. I am very pleased with how things seem to be progressing at this point. She is still having pain in the gluteal/sacral region however I think this is more due to pressure to the region I think she needs to definitely focus more on trying as much as possible to prevent any issues with this continuing to be the case. Electronic Signature(s) Signed: 11/09/2019 3:53:10 PM By: Worthy Keeler PA-C Entered By: Worthy Keeler on 11/09/2019 15:53:10 Kimberly Roy, Kimberly W. ([id number]) -------------------------------------------------------------------------------- Physical Exam Details Patient Name: Kimberly Roy Date of Service: 11/09/2019 2:00 PM Medical Record Number: [id number] Patient Account Number: [id number] Date of Birth/Sex: 1930-10-29 (86 y.o. F) Treating RN: Montey Hora Primary Care Provider: Einar Pheasant Other Clinician: Referring Provider: Einar Pheasant Treating Provider/Extender: STONE III, Othelia Riederer Weeks in Treatment: 1 Constitutional Well-nourished and well-hydrated in no acute distress. Respiratory normal breathing without difficulty. Psychiatric this patient is able to make decisions and demonstrates good insight into disease process. Alert and Oriented x 3. pleasant and cooperative. Notes Upon inspection today there does not appear to be any evidence of open wounds in the gluteal region at this point she is completely healed based on what I am seeing. There is still tenderness over the gluteal and sacral region in general and I think this is due  to the fact that she  needs to try to as much as possible keep pressure off of this region by repositioning quite frequently. Electronic Signature(s) Signed: 11/09/2019 3:52:36 PM By: Worthy Keeler PA-C Entered By: Worthy Keeler on 11/09/2019 15:52:36 Kimberly Roy, Kimberly W. ([id number]) -------------------------------------------------------------------------------- Physician Orders Details Patient Name: Kimberly Roy Date of Service: 11/09/2019 2:00 PM Medical Record Number: [id number] Patient Account Number: [id number] Date of Birth/Sex: 1931-05-25 (86 y.o. F) Treating RN: Montey Hora Primary Care Provider: Einar Pheasant Other Clinician: Referring Provider: Einar Pheasant Treating Provider/Extender: Melburn Hake, Kavina Cantave Weeks in Treatment: 1 Verbal / Phone Orders: No Diagnosis Coding ICD-10 Coding Code Description L89.312 Pressure ulcer of right buttock, stage 2 I10 Essential (primary) hypertension G47.30 Sleep apnea, unspecified Wound Cleansing o May Shower, gently pat wound dry prior to applying new dressing. Skin Barriers/Peri-Wound Care o Barrier cream - zinc oxide cream Follow-up Appointments o Return Appointment in 1 week. Electronic Signature(s) Signed: 11/09/2019 4:36:26 PM By: Montey Hora Signed: 11/09/2019 4:47:23 PM By: Worthy Keeler PA-C Entered By: Montey Hora on 11/09/2019 14:45:18 Kimberly Roy, Kimberly W. ([id number]) -------------------------------------------------------------------------------- Problem List Details Patient Name: Kimberly Roy Date of Service: 11/09/2019 2:00 PM Medical Record Number: [id number] Patient Account Number: [id number] Date of Birth/Sex: [birth year] (86 y.o. F) Treating RN: Montey Hora Primary Care Provider: Einar Pheasant Other Clinician: Referring Provider: Einar Pheasant Treating Provider/Extender: Melburn Hake, Kavaughn Faucett Weeks in Treatment: 1 Active Problems ICD-10 Evaluated Encounter Code Description Active Date Today Diagnosis L89.312  Pressure ulcer of right buttock, stage 2 11/02/2019 No Yes I10 Essential (primary) hypertension 11/02/2019 No Yes G47.30 Sleep apnea, unspecified 11/02/2019 No Yes Inactive Problems Resolved Problems Electronic Signature(s) Signed: 11/09/2019 2:16:22 PM By: Worthy Keeler PA-C Entered By: Worthy Keeler on 11/09/2019 14:16:22 Kimberly Roy, Kimberly W. ([id number]) -------------------------------------------------------------------------------- Progress Note Details Patient Name: Kimberly Roy Date of Service: 11/09/2019 2:00 PM Medical Record Number: [id number] Patient Account Number: [id number] Date of Birth/Sex: 1931-06-23 (86 y.o. F) Treating RN: Montey Hora Primary Care Provider: Einar Pheasant Other Clinician: Referring Provider: Einar Pheasant Treating Provider/Extender: Melburn Hake, Cassidey Barrales Weeks in Treatment: 1 Subjective Chief Complaint Information obtained from Patient Pressure ulcer right buttock History of Present Illness (HPI) 11/02/2019 upon evaluation today patient actually presents for initial evaluation here in our clinic concerning issues she has with a pressure ulcer of the right gluteal region which fortunately appears to be somewhat superficial but nonetheless is causing her a lot of discomfort. She does have a history of hypertension and sleep apnea. With that being said the patient's wound unfortunately seems to be having of occurred as a result of pressure. The patient is not really able to lay flat and therefore spends a lot of time having to sit. This is due to issues with her back I do believe. Nonetheless I think this has been a big issue as far as trying to get the area to effectively heal. The do note that she is having a lot of pain about the a.m. in the patient and her daughter who helps to care for her as well. With that being said the fortunate thing is I see no signs of infection at this point which is great news. No fevers, chills, nausea, vomiting, or diarrhea. 11/09/2019  upon evaluation today patient appears to be doing very well with regard to the gluteal region. There is no signs of open wounds at this time which is great news. No fevers, chills, nausea, vomiting, or diarrhea. I am very pleased with how things seem to be progressing at  this point. She is still having pain in the gluteal/sacral region however I think this is more due to pressure to the region I think she needs to definitely focus more on trying as much as possible to prevent any issues with this continuing to be the case. Objective Constitutional Well-nourished and well-hydrated in no acute distress. Vitals Time Taken: 2:15 PM, Height: 63 in, Weight: 190 lbs, BMI: 33.7, Temperature: 98.8 F, Pulse: 50 bpm, Respiratory Rate: 18 breaths/min, Blood Pressure: 145/55 mmHg. Respiratory normal breathing without difficulty. Psychiatric this patient is able to make decisions and demonstrates good insight into disease process. Alert and Oriented x 3. pleasant and cooperative. General Notes: Upon inspection today there does not appear to be any evidence of open wounds in the gluteal region at this point she is completely healed based on what I am seeing. There is still tenderness over the gluteal and sacral region in general and I think this is due to the fact that she needs to try to as much as possible keep pressure off of this region by repositioning quite frequently. Integumentary (Hair, Skin) Wound #4 status is Healed - Epithelialized. Original cause of wound was Gradually Appeared. The wound is located on the Right Gluteus. The wound measures 0cm length x 0cm width x 0cm depth; 0cm^2 area and 0cm^3 volume. The wound is limited to skin breakdown. There is a medium amount of serosanguineous drainage noted. The wound margin is flat and intact. There is no granulation within the wound bed. There is no necrotic tissue within the wound bed. Kimberly Roy, Kimberly W. ([id number]) Assessment Active  Problems ICD-10 Pressure ulcer of right buttock, stage 2 Essential (primary) hypertension Sleep apnea, unspecified Plan Wound Cleansing: May Shower, gently pat wound dry prior to applying new dressing. Skin Barriers/Peri-Wound Care: Barrier cream - zinc oxide cream Follow-up Appointments: Return Appointment in 1 week. 1. My recommendation 2. Also think she needs to have appropriate and frequent offloading in order to prevent any additional at this point is good to be that we use zinc oxide to prevent skin breakdown in the sacral and gluteal region which the patient is in agreement with. Breakdown as far as the sacral gluteal region is concerned this is can be the one thing to do to try and prevent there from being issues ongoing. We will see the patient back for follow-up visit in 1 week's time in order to reevaluate and see where things stand. If she is doing well at that point I will plan to discharge at that time from wound care services. Electronic Signature(s) Signed: 11/09/2019 3:53:32 PM By: Worthy Keeler PA-C Entered By: Worthy Keeler on 11/09/2019 15:53:31 Kimberly Roy, Kimberly W. ([id number]) -------------------------------------------------------------------------------- SuperBill Details Patient Name: Kimberly Roy Date of Service: 11/09/2019 Medical Record Number: [id number] Patient Account Number: [id number] Date of Birth/Sex: 1931/03/06 (86 y.o. F) Treating RN: Montey Hora Primary Care Provider: Einar Pheasant Other Clinician: Referring Provider: Einar Pheasant Treating Provider/Extender: Melburn Hake, Bellamy Judson Weeks in Treatment: 1 Diagnosis Coding ICD-10 Codes Code Description L89.312 Pressure ulcer of right buttock, stage 2 I10 Essential (primary) hypertension G47.30 Sleep apnea, unspecified Facility Procedures CPT4 Code: 26948546 Description: 99213 - WOUND CARE VISIT-LEV 3 EST PT Modifier: Quantity: 1 Physician Procedures CPT4 Code: 2703500 Description: 99213 - WC PHYS  LEVEL 3 - EST PT Modifier: Quantity: 1 CPT4 Code: Description: ICD-10 Diagnosis Description X38.182 Pressure ulcer of right buttock, stage 2 I10 Essential (primary) hypertension G47.30 Sleep apnea, unspecified Modifier: Quantity: Electronic Signature(s) Signed: 11/09/2019 3:53:47 PM  By: Worthy Keeler PA-C Entered By: Worthy Keeler on 11/09/2019 15:53:46

## 2019-11-10 ENCOUNTER — Other Ambulatory Visit: Payer: PPO

## 2019-11-12 ENCOUNTER — Other Ambulatory Visit: Payer: Self-pay

## 2019-11-12 ENCOUNTER — Ambulatory Visit (INDEPENDENT_AMBULATORY_CARE_PROVIDER_SITE_OTHER): Payer: PPO

## 2019-11-12 ENCOUNTER — Telehealth (INDEPENDENT_AMBULATORY_CARE_PROVIDER_SITE_OTHER): Payer: PPO | Admitting: Internal Medicine

## 2019-11-12 VITALS — BP 132/51 | Ht 62.0 in | Wt 177.0 lb

## 2019-11-12 DIAGNOSIS — R5383 Other fatigue: Secondary | ICD-10-CM

## 2019-11-12 DIAGNOSIS — K219 Gastro-esophageal reflux disease without esophagitis: Secondary | ICD-10-CM | POA: Diagnosis not present

## 2019-11-12 DIAGNOSIS — D649 Anemia, unspecified: Secondary | ICD-10-CM | POA: Diagnosis not present

## 2019-11-12 DIAGNOSIS — I10 Essential (primary) hypertension: Secondary | ICD-10-CM | POA: Diagnosis not present

## 2019-11-12 DIAGNOSIS — M5440 Lumbago with sciatica, unspecified side: Secondary | ICD-10-CM

## 2019-11-12 DIAGNOSIS — Z0000 Encounter for general adult medical examination without abnormal findings: Secondary | ICD-10-CM | POA: Diagnosis not present

## 2019-11-12 DIAGNOSIS — D72829 Elevated white blood cell count, unspecified: Secondary | ICD-10-CM

## 2019-11-12 DIAGNOSIS — U071 COVID-19: Secondary | ICD-10-CM | POA: Diagnosis not present

## 2019-11-12 DIAGNOSIS — S31809D Unspecified open wound of unspecified buttock, subsequent encounter: Secondary | ICD-10-CM

## 2019-11-12 DIAGNOSIS — G8929 Other chronic pain: Secondary | ICD-10-CM | POA: Diagnosis not present

## 2019-11-12 NOTE — Progress Notes (Signed)
Patient ID: Kimberly Roy, female   DOB: 1930-10-08, 86 y.o.   MRN: [id number]   Virtual Visit via video Note  This visit type was conducted due to national recommendations for restrictions regarding the COVID-19 pandemic (e.g. social distancing).  This format is felt to be most appropriate for this patient at this time.  All issues noted in this document were discussed and addressed.  No physical exam was performed (except for noted visual exam findings with Video Visits).   I connected with [name] by a video enabled telemedicine application or telephone and verified that I am speaking with the correct person using two identifiers. Location patient: home Location provider: work  Persons participating in the virtual visit: patient, provider  The limitations, risks, security and privacy concerns of performing an evaluation and management service by video and the availability of in person appointments have been discussed.  The patient expressed understanding and agreed to proceed.   Reason for visit: scheduled follow up.   HPI: Diagnosed with covid 09/28/19.  Was previously treated with abx and prednisone.  Also started on albuterol inhaler.  She reports she is feeling better.  Having occasional drainage.  No fever.  No significant cough.  No nausea or vomiting.  Repeat cxr negative for pneumonia.  Was evaluated in respiratory clinic and treated with omnicef and continued on inhaler.  As stated,feeling better.  Still with weakness and fatigue.  Blood pressures doing well - averaging 120'2-140s/50-60s.  Eating.  No nausea or vomiting.  No abdominal pain.  Has been to the wound center.  Wound healing.  No pain now.  Bowels moving.  Daughter had questions about her labs.  Also, request to be referred to pulmonary.  Daughter (who also had covid) was just evaluated in pulmonary clinic.     ROS: See pertinent positives and negatives per HPI.  Past Medical History:  Diagnosis Date  . Anemia   . Anxiety    . Chest pain   . CHF (congestive heart failure) ([place])   . Constipation   . DDD (degenerative disc disease), cervical   . Depression   . DVT (deep venous thrombosis) ([place])   . Dysphonia   . Dyspnea   . Fatty liver   . Fatty liver   . Headache   . Hyperlipidemia   . Hyperpiesia   . Hypertension   . Hypothyroidism   . Interstitial cystitis   . Left ventricular dysfunction   . Lymphedema   . Nephrolithiasis   . Obstructive sleep apnea   . Osteoarthritis    knees/cervical and lumbar spine  . Pulmonary hypertension ([place])   . Pulmonary nodules    followed by Dr Raul Del  . Pure hypercholesterolemia   . Renal cyst    right    Past Surgical History:  Procedure Laterality Date  . ABDOMINAL HYSTERECTOMY     ovaries left in place  . APPENDECTOMY    . Back Surgeries    . BACK SURGERY    . BREAST REDUCTION SURGERY     3/99  . CARDIAC CATHETERIZATION    . cataracts Bilateral   . CERVICAL SPINE SURGERY    . ESOPHAGEAL MANOMETRY N/A 08/02/2015   Procedure: ESOPHAGEAL MANOMETRY (EM);  Surgeon: Josefine Class, MD;  Location[facility] ENDOSCOPY;  Service: Endoscopy;  Laterality: N/A;  . ESOPHAGOGASTRODUODENOSCOPY N/A 02/27/2015   Procedure: ESOPHAGOGASTRODUODENOSCOPY (EGD);  Surgeon: Hulen Luster, MD;  Location: [facility] ENDOSCOPY;  Service: Gastroenterology;  Laterality: N/A;  . ESOPHAGOGASTRODUODENOSCOPY (EGD)  WITH PROPOFOL N/A 10/30/2018   Procedure: ESOPHAGOGASTRODUODENOSCOPY (EGD) WITH PROPOFOL;  Surgeon: Lollie Sails, MD;  Location: [facility] ENDOSCOPY;  Service: Endoscopy;  Laterality: N/A;  . EXCISIONAL HEMORRHOIDECTOMY    . EYE SURGERY    . FRACTURE SURGERY    . HEMORRHOID SURGERY    . HIP SURGERY  2013   Right hip surgery  . JOINT REPLACEMENT    . KNEE ARTHROSCOPY     left and right  . ORIF FEMUR FRACTURE Right 08/07/2018   Procedure: OPEN REDUCTION INTERNAL FIXATION (ORIF) DISTAL FEMUR FRACTURE;  Surgeon: Hessie Knows, MD;  Location: [facility] ORS;  Service: Orthopedics;   Laterality: Right;  . REDUCTION MAMMAPLASTY Bilateral YRS AGO  . REPLACEMENT TOTAL KNEE Bilateral   . rotator cuff surgery     blilateral  . TONSILECTOMY/ADENOIDECTOMY WITH MYRINGOTOMY    . VISCERAL ARTERY INTERVENTION N/A 02/25/2019   Procedure: VISCERAL ARTERY INTERVENTION;  Surgeon: Algernon Huxley, MD;  Location: [facility] CV LAB;  Service: Cardiovascular;  Laterality: N/A;    Family History  Problem Relation Age of Onset  . Heart disease Mother   . Stroke Mother   . Hypertension Mother   . Heart disease Father        myocardial infarction age 82  . Breast cancer Neg Hx     SOCIAL HX: reviewed.    Current Outpatient Medications:  .  albuterol (VENTOLIN HFA) 108 (90 Base) MCG/ACT inhaler, INHALE 2 PUFFS INTO THE LUNGS EVERY 6 (SIX) HOURS AS NEEDED FOR WHEEZING OR SHORTNESS OF BREATH, Disp: 18 g, Rfl: 0 .  aluminum-magnesium hydroxide-simethicone (MAALOX) 735-329-92 MG/5ML SUSP, Take 15 mLs by mouth 4 (four) times daily -  before meals and at bedtime. , Disp: , Rfl:  .  amLODipine (NORVASC) 10 MG tablet, TAKE 1 TABLET BY MOUTH DAILY, Disp: 90 tablet, Rfl: 1 .  atorvastatin (LIPITOR) 20 MG tablet, TAKE 1 TABLET BY MOUTH AT BEDTIME, Disp: 90 tablet, Rfl: 1 .  azelastine (ASTELIN) 0.1 % nasal spray, Place 1 spray into both nostrils 2 (two) times daily. Use in each nostril as directed, Disp: 30 mL, Rfl: 1 .  benazepril (LOTENSIN) 40 MG tablet, TAKE 1 TABLET BY MOUTH DAILY, Disp: 90 tablet, Rfl: 1 .  busPIRone (BUSPAR) 10 MG tablet, Take 10 mg by mouth 2 (two) times daily. , Disp: , Rfl:  .  calcium citrate-vitamin D (CITRACAL+D) 315-200 MG-UNIT tablet, Take 1 tablet by mouth daily., Disp: , Rfl:  .  Casanthranol-Docusate Sodium 30-100 MG CAPS, Take 1 capsule by mouth daily. , Disp: , Rfl:  .  cefdinir (OMNICEF) 300 MG capsule, Take 1 capsule (300 mg total) by mouth daily., Disp: 20 capsule, Rfl: 0 .  Cholecalciferol (VITAMIN D3) 1000 UNITS CAPS, Take by mouth., Disp: , Rfl:  .   DULoxetine (CYMBALTA) 60 MG capsule, TAKE 1 CAPSULE BY MOUTH EVERY DAY., Disp: 30 capsule, Rfl: 3 .  furosemide (LASIX) 20 MG tablet, TAKE 1 TABLET BY MOUTH DAILY AS NEEDED, Disp: 90 tablet, Rfl: 0 .  gabapentin (NEURONTIN) 300 MG capsule, Take 2 capsules (600 mg total) by mouth 3 (three) times daily., Disp: 540 capsule, Rfl: 2 .  hydrochlorothiazide (MICROZIDE) 12.5 MG capsule, Take 12.5 mg by mouth daily., Disp: , Rfl:  .  HYDROcodone-acetaminophen (NORCO) 10-325 MG tablet, Take 1 tablet by mouth 3 (three) times daily., Disp: 10 tablet, Rfl: 0 .  magnesium oxide (MAG-OX) 400 MG tablet, Take 1 tablet (400 mg total) by mouth daily., Disp: 30 tablet,  Rfl: 1 .  metoprolol succinate (TOPROL-XL) 100 MG 24 hr tablet, Take 100 mg by mouth 2 (two) times daily., Disp: , Rfl:  .  morphine (MS CONTIN) 60 MG 12 hr tablet, Take 1 tablet (60 mg total) by mouth 2 (two) times daily., Disp: 10 tablet, Rfl: 0 .  Naldemedine Tosylate (SYMPROIC) 0.2 MG TABS, Take 0.2 mg by mouth daily., Disp: , Rfl:  .  nystatin cream (MYCOSTATIN), Apply 1 application topically daily as needed., Disp: 30 g, Rfl: 0 .  polyethylene glycol powder (GLYCOLAX/MIRALAX) 17 GM/SCOOP powder, MIX 17 GRAMS AS MARKED ON BOTTLE TOP IN 8 OUNCES OF WATER AND DRINK ONCE A DAY AS DIRECTED., Disp: 527 g, Rfl: 0 .  Probiotic Product (ALIGN PO), Take by mouth daily., Disp: , Rfl:  .  RABEprazole (ACIPHEX) 20 MG tablet, Take 1 tablet by mouth daily., Disp: , Rfl:  .  senna (SENOKOT) 8.6 MG TABS tablet, Take 1 tablet by mouth 2 (two) times daily., Disp: , Rfl:  .  Simethicone (GAS-X PO), Take 2 tablets by mouth 2 (two) times daily., Disp: , Rfl:  .  sodium chloride (OCEAN) 0.65 % nasal spray, Place 1 spray into the nose as needed., Disp: , Rfl:  .  SYNTHROID 100 MCG tablet, TAKE 1 TABLET BY MOUTH DAILY, Disp: 90 tablet, Rfl: 3 .  traZODone (DESYREL) 50 MG tablet, Take 25-50 mg by mouth at bedtime as needed., Disp: , Rfl:  .  triamcinolone cream (KENALOG)  0.1 %, , Disp: , Rfl:   EXAM:  VITALS per patient if applicable:  335/45  GENERAL: alert, oriented, appears well and in no acute distress  HEENT: atraumatic, conjunttiva clear, no obvious abnormalities on inspection of external nose and ears  NECK: normal movements of the head and neck  LUNGS: on inspection no signs of respiratory distress, breathing rate appears normal, no obvious gross SOB, gasping or wheezing  CV: no obvious cyanosis  PSYCH/NEURO: pleasant and cooperative, no obvious depression or anxiety, speech and thought processing grossly intact  ASSESSMENT AND PLAN:  Discussed the following assessment and plan:  Anemia hgb just checked through home health - stable 10.4.  Follow.    Wound of buttock Was evaluated at wound clinic.  Healing.  No pain now.  Follow.    Back pain Chronic.  Stable.  Has been followed by ortho and pain clinic.   COVID-19 virus infection Diagnosed with covid 09/27/19.  Has been treated with steroid nasal spray, saline nasal spray, mucinex, prednisone taper and abx (doxycycline and omnicef).  She is better.  No significant cough now.  Breathing better.  Still fatigued.  She request referral to pulmonary.    Fatigue Persistent fatigue - discussed covid and residual effects.  Discussed continued rest.  Therapy/exercise.    GERD (gastroesophageal reflux disease) Controlled on aciphex.    Hypertension Blood pressures as outlined.  Continue hctz, amlodipine and benazepril.  Follow pressures.  Follow metabolic panel.   Leukocytosis Leukocytosis found on recent lab drawn by home health.  They are rechecking labs on her next week.  Follow.     Orders Placed This Encounter  Procedures  . Ambulatory referral to Pulmonology    Referral Priority:   Routine    Referral Type:   Consultation    Referral Reason:   Specialty Services Required    Requested Specialty:   Pulmonary Disease    Number of Visits Requested:   1     I discussed the  assessment and  treatment plan with the patient. The patient was provided an opportunity to ask questions and all were answered. The patient agreed with the plan and demonstrated an understanding of the instructions.   The patient was advised to call back or seek an in-person evaluation if the symptoms worsen or if the condition fails to improve as anticipated.   Einar Pheasant, MD

## 2019-11-12 NOTE — Progress Notes (Addendum)
Subjective:   Kimberly Roy is a 86 y.o. female who presents for Medicare Annual (Subsequent) preventive examination.  Review of Systems:  No ROS.  Medicare Wellness Virtual Visit.  Visual/audio telehealth visit. Vitals provided by patient. See social history for additional risk factors.  Cardiac Risk Factors include: advanced age (>30mn, >>54women);hypertension     Objective:     Vitals: BP (!) 132/51   Ht 5' 2" (1.575 m)   Wt 177 lb (80.3 kg)   BMI 32.37 kg/m   Body mass index is 32.37 kg/m.  Advanced Directives 11/12/2019 02/25/2019 11/11/2018 11/03/2018 10/30/2018 08/28/2018 08/07/2018  Does Patient Have a Medical Advance Directive? _0  Yes Yes  Type of AParamed[place]of APle[place]Living will Living will;Healthcare Power of [place] will - HV[place] will [place] will  Does patient want to make changes to medical advance directive? No - Patient declined - - No - Patient declined - No - Patient declined No - Patient declined  Copy of [place] Chart? No - copy requested No - copy requested No - copy requested No - copy requested - - -  Would patient like information on creating a medical advance directive? - - - No - Patient declined - - No - Patient declined    Tobacco Social History   Tobacco Use  Smoking Status Never Smoker  Smokeless Tobacco Never Used     Counseling given: Not Answered   Clinical Intake:  Pre-visit preparation completed: Yes        Diabetes: No  How often do you need to have someone help you when you read instructions, pamphlets, or other written materials from your doctor or pharmacy?: 1 - Never  Interpreter Needed?: No     Past Medical History:  Diagnosis Date  . Anemia   . Anxiety   . Chest pain   . CHF (congestive heart failure) ([place]   . Constipation   .  DDD (degenerative disc disease), cervical   . Depression   . DVT (deep venous thrombosis) ([place]   . Dysphonia   . Dyspnea   . Fatty liver   . Fatty liver   . Headache   . Hyperlipidemia   . Hyperpiesia   . Hypertension   . Hypothyroidism   . Interstitial cystitis   . Left ventricular dysfunction   . Lymphedema   . Nephrolithiasis   . Obstructive sleep apnea   . Osteoarthritis    knees/cervical and lumbar spine  . Pulmonary hypertension ([place]   . Pulmonary nodules    followed by Dr FRaul Del . Pure hypercholesterolemia   . Renal cyst    right   Past Surgical History:  Procedure Laterality Date  . ABDOMINAL HYSTERECTOMY     ovaries left in place  . APPENDECTOMY    . Back Surgeries    . BACK SURGERY    . BREAST REDUCTION SURGERY     3/99  . CARDIAC CATHETERIZATION    . cataracts Bilateral   . CERVICAL SPINE SURGERY    . ESOPHAGEAL MANOMETRY N/A 08/02/2015   Procedure: ESOPHAGEAL MANOMETRY (EM);  Surgeon: MJosefine Class MD;  [facility];  Service: Endoscopy;  Laterality: N/A;  . ESOPHAGOGASTRODUODENOSCOPY N/A 02/27/2015   Procedure: ESOPHAGOGASTRODUODENOSCOPY (EGD);  Surgeon: PHulen Luster MD;  [facility];  Service: Gastroenterology;  Laterality: N/A;  . ESOPHAGOGASTRODUODENOSCOPY (EGD)  WITH PROPOFOL N/A 10/30/2018   Procedure: ESOPHAGOGASTRODUODENOSCOPY (EGD) WITH PROPOFOL;  Surgeon: Lollie Sails, MD;  Location[facility];  Service: Endoscopy;  Laterality: N/A;  . EXCISIONAL HEMORRHOIDECTOMY    . EYE SURGERY    . FRACTURE SURGERY    . HEMORRHOID SURGERY    . HIP SURGERY  2013   Right hip surgery  . JOINT REPLACEMENT    . KNEE ARTHROSCOPY     left and right  . ORIF FEMUR FRACTURE Right 08/07/2018   Procedure: OPEN REDUCTION INTERNAL FIXATION (ORIF) DISTAL FEMUR FRACTURE;  Surgeon: Hessie Knows, MD;  Location: [facility] ORS;  Service: Orthopedics;  Laterality: Right;  . REDUCTION MAMMAPLASTY Bilateral YRS AGO  . REPLACEMENT TOTAL  KNEE Bilateral   . rotator cuff surgery     blilateral  . TONSILECTOMY/ADENOIDECTOMY WITH MYRINGOTOMY    . VISCERAL ARTERY INTERVENTION N/A 02/25/2019   Procedure: VISCERAL ARTERY INTERVENTION;  Surgeon: Algernon Huxley, MD;  Location: [facility];  Service: Cardiovascular;  Laterality: N/A;   Family History  Problem Relation Age of Onset  . Heart disease Mother   . Stroke Mother   . Hypertension Mother   . Heart disease Father        myocardial infarction age 63  . Breast cancer Neg Hx    Social History   Socioeconomic History  . Marital status: Widowed    Spouse name: Not on file  . Number of children: 2  . Years of education: Not on file  . Highest education level: Not on file  Occupational History    Comment: insurance agency  Tobacco Use  . Smoking status: Never Smoker  . Smokeless tobacco: Never Used  Substance and Sexual Activity  . Alcohol use: No    Alcohol/week: 0.0 standard drinks  . Drug use: Never  . Sexual activity: Not Currently  Other Topics Concern  . Not on file  Social History Narrative  . Not on file   Social Determinants of Health   Financial Resource Strain:   . Difficulty of Paying Living Expenses:   Food Insecurity:   . Worried About Charity fundraiser in the Last Year:   . Arboriculturist in the Last Year:   Transportation Needs:   . Film/video editor (Medical):   Marland Kitchen Lack of Transportation (Non-Medical):   Physical Activity:   . Days of Exercise per Week:   . Minutes of Exercise per Session:   Stress:   . Feeling of Stress :   Social Connections:   . Frequency of Communication with Friends and Family:   . Frequency of Social Gatherings with Friends and Family:   . Attends Religious Services:   . Active Member of Clubs or Organizations:   . Attends Archivist Meetings:   Marland Kitchen Marital Status:     Outpatient Encounter Medications as of 11/12/2019  Medication Sig  . albuterol (VENTOLIN HFA) 108 (90 Base) MCG/ACT  inhaler Inhale 2 puffs into the lungs every 6 (six) hours as needed for wheezing or shortness of breath (cough, shortness of breath or wheezing.).  Marland Kitchen aluminum-magnesium hydroxide-simethicone (MAALOX) 720-947-09 MG/5ML SUSP Take 15 mLs by mouth 4 (four) times daily -  before meals and at bedtime.   Marland Kitchen amLODipine (NORVASC) 10 MG tablet TAKE 1 TABLET BY MOUTH DAILY  . atorvastatin (LIPITOR) 20 MG tablet TAKE 1 TABLET BY MOUTH AT BEDTIME  . azelastine (ASTELIN) 0.1 % nasal spray Place 1 spray into both nostrils 2 (  two) times daily. Use in each nostril as directed  . benazepril (LOTENSIN) 40 MG tablet TAKE 1 TABLET BY MOUTH DAILY  . busPIRone (BUSPAR) 10 MG tablet Take 10 mg by mouth 2 (two) times daily.   . calcium citrate-vitamin D (CITRACAL+D) 315-200 MG-UNIT tablet Take 1 tablet by mouth daily.  [name] Sodium 30-100 MG CAPS Take 1 capsule by mouth daily.   . cefdinir (OMNICEF) 300 MG capsule Take 1 capsule (300 mg total) by mouth daily.  . Cholecalciferol (VITAMIN D3) 1000 UNITS CAPS Take by mouth.  . DULoxetine (CYMBALTA) 60 MG capsule TAKE 1 CAPSULE BY MOUTH EVERY DAY.  . furosemide (LASIX) 20 MG tablet TAKE 1 TABLET BY MOUTH DAILY AS NEEDED  . gabapentin (NEURONTIN) 300 MG capsule Take 2 capsules (600 mg total) by mouth 3 (three) times daily.  . hydrochlorothiazide (MICROZIDE) 12.5 MG capsule Take 12.5 mg by mouth daily.  Marland Kitchen HYDROcodone-acetaminophen (NORCO) 10-325 MG tablet Take 1 tablet by mouth 3 (three) times daily.  . magnesium oxide (MAG-OX) 400 MG tablet Take 1 tablet (400 mg total) by mouth daily.  . metoprolol succinate (TOPROL-XL) 100 MG 24 hr tablet Take 100 mg by mouth 2 (two) times daily.  Marland Kitchen morphine (MS CONTIN) 60 MG 12 hr tablet Take 1 tablet (60 mg total) by mouth 2 (two) times daily.  Melynda Ripple Tosylate (SYMPROIC) 0.2 MG TABS Take 0.2 mg by mouth daily.  Marland Kitchen nystatin cream (MYCOSTATIN) Apply 1 application topically daily as needed.  . polyethylene glycol  powder (GLYCOLAX/MIRALAX) 17 GM/SCOOP powder MIX 17 GRAMS AS MARKED ON BOTTLE TOP IN 8 OUNCES OF WATER AND DRINK ONCE A DAY AS DIRECTED.  . Probiotic Product (ALIGN PO) Take by mouth daily.  . RABEprazole (ACIPHEX) 20 MG tablet Take 1 tablet by mouth daily.  Marland Kitchen senna (SENOKOT) 8.6 MG TABS tablet Take 1 tablet by mouth 2 (two) times daily.  . Simethicone (GAS-X PO) Take 2 tablets by mouth 2 (two) times daily.  . sodium chloride (OCEAN) 0.65 % nasal spray Place 1 spray into the nose as needed.  Marland Kitchen SYNTHROID 100 MCG tablet TAKE 1 TABLET BY MOUTH DAILY  . traZODone (DESYREL) 50 MG tablet Take 25-50 mg by mouth at bedtime as needed.  . triamcinolone cream (KENALOG) 0.1 %    No facility-administered encounter medications on file as of 11/12/2019.    Activities of Daily Living In your present state of health, do you have any difficulty performing the following activities: 11/12/2019  Hearing? Y  Comment Hearing aids  Vision? N  Difficulty concentrating or making decisions? N  Walking or climbing stairs? Y  Comment Unsteady gait; walker in use.  Dressing or bathing? N  Doing errands, shopping? Y  Comment She does not Physiological scientist and eating ? Y  Comment Daughter meal preps. Self feeds.  Using the Toilet? N  In the past six months, have you accidently leaked urine? N  Do you have problems with loss of bowel control? N  Managing your Medications? N  Managing your Finances? N  Housekeeping or managing your Housekeeping? Y  Comment Daughter assist.  Some recent data might be hidden    Patient Care Team: Einar Pheasant, [name], [name], [name], MD (Otolaryngology)    Assessment:   This is a routine wellness examination for Kimberly Roy.  Nurse connected with patient 11/12/19 at  2:00 PM EST by a  telephone enabled telemedicine application and verified that I am  speaking with the correct person using two identifiers. Patient stated full name and DOB. Patient gave permission to continue with virtual visit. Patient's location was at home and Nurse's location was at [place] office.   Patient is alert and oriented x3. Patient denies difficulty focusing or concentrating. Patient likes to read on the ipad and watch tv for brain stimulation.   Health Maintenance Due: See completed HM at the end of note.   Eye: Visual acuity not assessed. Virtual visit. Followed by their ophthalmologist. Wears glasses for distance only.  Dental: Visits every 6 months.    Hearing: Hearing aids- yes  Safety:  Patient feels safe at home- yes Patient does have smoke detectors at home- yes Patient does wear sunscreen or protective clothing when in direct sunlight - yes Patient does wear seat belt when in a moving vehicle - yes Patient drives- no Adequate lighting in walkways free from debris- yes Grab bars and handrails used as appropriate- yes Ambulates with an assistive device- yes; walker  Social: Alcohol intake - no     Smoking history- never   Smokers in home? none Illicit drug use? none  Medication: Taking as directed and without issues.  Pill box in use -yes  Self managed - yes   Covid-19: Precautions and sickness symptoms discussed. Wears mask, social distancing, hand hygiene as appropriate.   Activities of Daily Living Patient denies needing assistance with: feeding themselves, getting from bed to chair, getting to the toilet, bathing/showering, dressing, managing money.  Assisted by daughter with household chores, and preparing meals.  Patient is considering meals on wheels in the future when daughter is no longer able to assist. Referral to be placed at that time.    Discussed the importance of a healthy diet, water intake and the benefits of aerobic exercise.  Physical activity- walking back and forth inside the home.   Diet:   Regular Water: good intake Caffeine: 1 cup of coffee  Other Providers Patient Care Team: Einar Pheasant, [name], [name], [name], MD (Otolaryngology)  Exercise Activities and Dietary recommendations Current Exercise Habits: Home exercise routine, Intensity: Mild  Goals      Patient Stated   . Maintain Healthy Lifestyle (pt-stated)     Stay active Stay hydrated Healthy diet       Fall Risk Fall Risk  11/12/2019 10/04/2019 11/11/2018 11/02/2018 10/20/2018  Falls in the past year? 0 0 1 (No Data) (No Data)  Comment - - - Patient denies new/ recent falls Patient denies new/ recent falls  Number falls in past yr: - 0 0 - -  Injury with Fall? - 0 1 - -  Comment - - Followed by pcp - -  Risk for fall due to : - - - - -  Follow up Falls evaluation completed Falls evaluation completed - Falls prevention discussed Falls prevention discussed   Timed Get Up and Go performed: no, virtual visit  Depression Screen PHQ 2/9 Scores 11/12/2019 10/04/2019 11/11/2018 08/28/2018  PHQ - 2 Score 0 0 - 0  PHQ- 9 Score 0 0 - -  Exception Documentation - - Patient refusal -     Cognitive Function MMSE - Mini Mental State Exam 03/08/2015  Orientation to time 5  Orientation to Place 5  Registration 3  Attention/ Calculation 5  Recall 3  Language- name 2 objects 2  Language- repeat 1  Language- follow 3 step command 3  Language- read & follow direction 1  Write a sentence 1  Copy design 1  Total score 30     6CIT Screen 11/11/2018 09/10/2017 08/02/2016  What Year? 0 points 0 points 0 points  What month? 0 points 0 points 0 points  What time? 0 points 0 points 0 points  Count back from 20 0 points 0 points 0 points  Months in reverse 0 points 0 points 0 points  Repeat phrase 0 points 0 points 0 points  Total Score 0 0 0    Immunization History  Administered Date(s)  Administered  . Influenza Split 07/13/2009, 07/03/2012  . Influenza, High Dose Seasonal PF 05/13/2016, 06/11/2017, 06/05/2018, 06/07/2019  . Influenza,inj,Quad PF,6+ Mos 05/25/2013, 05/31/2014, 04/27/2015  . Pneumococcal Conjugate-13 11/29/2014  . Pneumococcal-Unspecified 06/22/2001  . Tdap 09/17/2017  . Zoster Recombinat (Shingrix) 06/07/2019   Screening Tests Health Maintenance  Topic Date Due  . MAMMOGRAM  11/13/2019  . TETANUS/TDAP  09/18/2027  . INFLUENZA VACCINE  Completed  . DEXA SCAN  Completed  . PNA vac Low Risk Adult  Completed      Plan:   Keep all routine maintenance appointments.   Follow up as directed by your doctor.   Medicare Attestation I have personally reviewed: The patient's medical and social history Their use of alcohol, tobacco or illicit drugs Their current medications and supplements The patient's functional ability including ADLs,fall risks, home safety risks, cognitive, and hearing and visual impairment Diet and physical activities Evidence for depression   I have reviewed and discussed with patient certain preventive protocols, quality metrics, and best practice recommendations.     Varney Biles, LPN  3/39/1792   Reviewed above information.  Agree with assessment and plan.   Dr Nicki Reaper

## 2019-11-12 NOTE — Patient Instructions (Addendum)
  Ms. Perdomo , Thank you for taking time to come for your Medicare Wellness Visit. I appreciate your ongoing commitment to your health goals. Please review the following plan we discussed and let me know if I can assist you in the future.   These are the goals we discussed: Goals      Patient Stated   . Maintain Healthy Lifestyle (pt-stated)     Stay active Stay hydrated Healthy diet       This is a list of the screening recommended for you and due dates:  Health Maintenance  Topic Date Due  . Mammogram  11/13/2019  . Tetanus Vaccine  09/18/2027  . Flu Shot  Completed  . DEXA scan (bone density measurement)  Completed  . Pneumonia vaccines  Completed

## 2019-11-13 ENCOUNTER — Other Ambulatory Visit: Payer: Self-pay | Admitting: Family Medicine

## 2019-11-13 NOTE — Telephone Encounter (Signed)
  Notes to clinic There is no protocol listed for me to go by.

## 2019-11-15 ENCOUNTER — Encounter: Payer: Self-pay | Admitting: Internal Medicine

## 2019-11-15 ENCOUNTER — Other Ambulatory Visit: Payer: Self-pay

## 2019-11-15 ENCOUNTER — Encounter: Payer: PPO | Admitting: Physician Assistant

## 2019-11-15 DIAGNOSIS — D649 Anemia, unspecified: Secondary | ICD-10-CM | POA: Diagnosis not present

## 2019-11-15 DIAGNOSIS — I509 Heart failure, unspecified: Secondary | ICD-10-CM | POA: Diagnosis not present

## 2019-11-15 DIAGNOSIS — L89312 Pressure ulcer of right buttock, stage 2: Secondary | ICD-10-CM | POA: Diagnosis not present

## 2019-11-15 DIAGNOSIS — I10 Essential (primary) hypertension: Secondary | ICD-10-CM | POA: Diagnosis not present

## 2019-11-15 DIAGNOSIS — D72829 Elevated white blood cell count, unspecified: Secondary | ICD-10-CM | POA: Insufficient documentation

## 2019-11-15 DIAGNOSIS — G4730 Sleep apnea, unspecified: Secondary | ICD-10-CM | POA: Diagnosis not present

## 2019-11-15 NOTE — Assessment & Plan Note (Signed)
Leukocytosis found on recent lab drawn by home health.  They are rechecking labs on her next week.  Follow.

## 2019-11-15 NOTE — Progress Notes (Addendum)
[name], [name] ([id number]) Visit Report for 11/15/2019 Chief Complaint Document Details Patient Name: Kimberly, Roy [facility] W. Date of Service: 11/15/2019 3:00 PM Medical Record Number: [id number] Patient Account Number: [id number] Date of Birth/Sex: [birth year] (86 y.o. F) Treating RN: Primary Care Provider: Einar Pheasant Other Clinician: Referring Provider: Einar Pheasant Treating Provider/Extender: Melburn Hake, Berkley Wrightsman Weeks in Treatment: 1 Information Obtained from: Patient Chief Complaint Pressure ulcer right buttock Electronic Signature(s) Signed: 11/15/2019 3:25:50 PM By: Worthy Keeler PA-C Entered By: Worthy Keeler on 11/15/2019 15:25:49 Kimberly Roy, Kimberly W. ([id number]) -------------------------------------------------------------------------------- HPI Details Patient Name: Kimberly Roy Date of Service: 11/15/2019 3:00 PM Medical Record Number: [id number] Patient Account Number: [id number] Date of Birth/Sex: [birth year] (86 y.o. F) Treating RN: Primary Care Provider: Einar Pheasant Other Clinician: Referring Provider: Einar Pheasant Treating Provider/Extender: Melburn Hake, Taleen Prosser Weeks in Treatment: 1 History of Present Illness HPI Description: 11/02/2019 upon evaluation today patient actually presents for initial evaluation here in our clinic concerning issues she has with a pressure ulcer of the right gluteal region which fortunately appears to be somewhat superficial but nonetheless is causing her a lot of discomfort. She does have a history of hypertension and sleep apnea. With that being said the patient's wound unfortunately seems to be having of occurred as a result of pressure. The patient is not really able to lay flat and therefore spends a lot of time having to sit. This is due to issues with her back I do believe. Nonetheless I think this has been a big issue as far as trying to get the area to effectively heal. The do note that she is having a lot of pain about the a.m. in the patient and her  daughter who helps to care for her as well. With that being said the fortunate thing is I see no signs of infection at this point which is great news. No fevers, chills, nausea, vomiting, or diarrhea. 11/09/2019 upon evaluation today patient appears to be doing very well with regard to the gluteal region. There is no signs of open wounds at this time which is great news. No fevers, chills, nausea, vomiting, or diarrhea. I am very pleased with how things seem to be progressing at this point. She is still having pain in the gluteal/sacral region however I think this is more due to pressure to the region I think she needs to definitely focus more on trying as much as possible to prevent any issues with this continuing to be the case. 11/15/2019 upon evaluation today patient appears to be doing well with regard to her sacral region there is no signs of anything open in the sacral or gluteal area. She does still have some discomfort but this I think is just due to pressure in general she does need to try to spend more time not sitting for prolonged periods of time we discussed this in great detail previous. Electronic Signature(s) Signed: 11/15/2019 3:40:15 PM By: Worthy Keeler PA-C Entered By: Worthy Keeler on 11/15/2019 15:40:15 Kimberly Roy, Kimberly W. ([id number]) -------------------------------------------------------------------------------- Physical Exam Details Patient Name: Kimberly Roy Date of Service: 11/15/2019 3:00 PM Medical Record Number: [id number] Patient Account Number: [id number] Date of Birth/Sex: 1930-09-07 (86 y.o. F) Treating RN: Primary Care Provider: Einar Pheasant Other Clinician: Referring Provider: Einar Pheasant Treating Provider/Extender: STONE III, Saw Mendenhall Weeks in Treatment: 1 Constitutional Well-nourished and well-hydrated in no acute distress. Respiratory normal breathing without difficulty. Psychiatric this patient is able to make decisions and demonstrates good insight into  disease process.  Alert and Oriented x 3. pleasant and cooperative. Notes Patient's gluteal and sacral area appear to be completely healed there is no signs of open wounds and subsequently at this point her skin though erythematous does not appear to be threatening to breakdown. I did not see any evidence of zinc on her skin she tells me she did not put on any this morning but I am not really sure she is even been using this in general or if she has she has been really washing it off very well which she does not necessarily describe that hard on because normally the zinc will stick to a greater degree to the area. I could not see any residual anywhere. Electronic Signature(s) Signed: 11/15/2019 3:40:58 PM By: Worthy Keeler PA-C Entered By: Worthy Keeler on 11/15/2019 15:40:58 Kimberly Roy, Kimberly W. ([id number]) -------------------------------------------------------------------------------- Physician Orders Details Patient Name: Kimberly Roy Date of Service: 11/15/2019 3:00 PM Medical Record Number: [id number] Patient Account Number: [id number] Date of Birth/Sex: 03/12/1931 (86 y.o. F) Treating RN: Army Melia Primary Care Provider: Einar Pheasant Other Clinician: Referring Provider: Einar Pheasant Treating Provider/Extender: Melburn Hake, Jeston Junkins Weeks in Treatment: 1 Verbal / Phone Orders: No Diagnosis Coding ICD-10 Coding Code Description E99.371 Pressure ulcer of right buttock, stage 2 I10 Essential (primary) hypertension G47.30 Sleep apnea, unspecified Discharge From [facility] o Discharge from [place] complete Electronic Signature(s) Signed: 11/15/2019 4:46:45 PM By: Worthy Keeler PA-C Signed: 11/16/2019 10:13:19 AM By: Army Melia Entered By: Army Melia on 11/15/2019 15:38:33 Kimberly Roy, Kimberly W. ([id number]) -------------------------------------------------------------------------------- Problem List Details Patient Name: Kimberly Roy. Date of Service: 11/15/2019  3:00 PM Medical Record Number: [id number] Patient Account Number: [id number] Date of Birth/Sex: October 28, 1930 (86 y.o. F) Treating RN: Primary Care Provider: Einar Pheasant Other Clinician: Referring Provider: Einar Pheasant Treating Provider/Extender: Melburn Hake, Latina Frank Weeks in Treatment: 1 Active Problems ICD-10 Evaluated Encounter Code Description Active Date Today Diagnosis L89.312 Pressure ulcer of right buttock, stage 2 11/02/2019 No Yes I10 Essential (primary) hypertension 11/02/2019 No Yes G47.30 Sleep apnea, unspecified 11/02/2019 No Yes Inactive Problems Resolved Problems Electronic Signature(s) Signed: 11/15/2019 3:25:36 PM By: Worthy Keeler PA-C Entered By: Worthy Keeler on 11/15/2019 15:25:35 Gawronski, Audrielle W. ([id number]) -------------------------------------------------------------------------------- Progress Note Details Patient Name: Kimberly Roy Date of Service: 11/15/2019 3:00 PM Medical Record Number: [id number] Patient Account Number: [id number] Date of Birth/Sex: 1930/11/16 (86 y.o. F) Treating RN: Primary Care Provider: Einar Pheasant Other Clinician: Referring Provider: Einar Pheasant Treating Provider/Extender: Melburn Hake, Rozlyn Yerby Weeks in Treatment: 1 Subjective Chief Complaint Information obtained from Patient Pressure ulcer right buttock History of Present Illness (HPI) 11/02/2019 upon evaluation today patient actually presents for initial evaluation here in our clinic concerning issues she has with a pressure ulcer of the right gluteal region which fortunately appears to be somewhat superficial but nonetheless is causing her a lot of discomfort. She does have a history of hypertension and sleep apnea. With that being said the patient's wound unfortunately seems to be having of occurred as a result of pressure. The patient is not really able to lay flat and therefore spends a lot of time having to sit. This is due to issues with her back I do believe. Nonetheless I  think this has been a big issue as far as trying to get the area to effectively heal. The do note that she is having a lot of pain about the a.m. in the patient and her daughter who helps to care for her  as well. With that being said the fortunate thing is I see no signs of infection at this point which is great news. No fevers, chills, nausea, vomiting, or diarrhea. 11/09/2019 upon evaluation today patient appears to be doing very well with regard to the gluteal region. There is no signs of open wounds at this time which is great news. No fevers, chills, nausea, vomiting, or diarrhea. I am very pleased with how things seem to be progressing at this point. She is still having pain in the gluteal/sacral region however I think this is more due to pressure to the region I think she needs to definitely focus more on trying as much as possible to prevent any issues with this continuing to be the case. 11/15/2019 upon evaluation today patient appears to be doing well with regard to her sacral region there is no signs of anything open in the sacral or gluteal area. She does still have some discomfort but this I think is just due to pressure in general she does need to try to spend more time not sitting for prolonged periods of time we discussed this in great detail previous. Objective Constitutional Well-nourished and well-hydrated in no acute distress. Vitals Time Taken: 3:20 PM, Height: 63 in, Weight: 190 lbs, BMI: 33.7, Temperature: 98.5 F, Pulse: 50 bpm, Respiratory Rate: 16 breaths/min, Blood Pressure: 129/55 mmHg. Respiratory normal breathing without difficulty. Psychiatric this patient is able to make decisions and demonstrates good insight into disease process. Alert and Oriented x 3. pleasant and cooperative. General Notes: Patient's gluteal and sacral area appear to be completely healed there is no signs of open wounds and subsequently at this point her skin though erythematous does not appear  to be threatening to breakdown. I did not see any evidence of zinc on her skin she tells me she did not put on any this morning but I am not really sure she is even been using this in general or if she has she has been really washing it off very well which she does not necessarily describe that hard on because normally the zinc will stick to a greater degree to the area. I could not see any residual anywhere. Assessment Kimberly Roy, Kimberly W. ([id number]) Active Problems ICD-10 Pressure ulcer of right buttock, stage 2 Essential (primary) hypertension Sleep apnea, unspecified Plan Discharge From [facility]: Discharge from [place] complete 1. At this point I would recommend she continue to utilize the zinc twice a day be applied to the sacral and gluteal region where she is prone to breakdown with this disorder this will help protect the area and hopefully allow it to heal more effectively and quickly. 2. I am also can recommend at this time that the patient continue to appropriately offload keeping pressure off of the sacral and gluteal region obviously this is to be the ideal thing to Kimberly Roy her from having any discomfort or pain. 3. I am also can recommend that she continue to attempt as much as possible to stand and walk I do not know how much she is able to really do this but especially if she is having to sit most of the time she needs to change positions frequently. We will see her back for follow-up visit as needed if anything changes or worsens. Electronic Signature(s) Signed: 11/15/2019 3:41:48 PM By: Worthy Keeler PA-C Entered By: Worthy Keeler on 11/15/2019 15:41:48 Kimberly Roy, Kimberly W. ([id number]) -------------------------------------------------------------------------------- SuperBill Details Patient Name: Kimberly Roy, Kimberly W. Date of Service:  11/15/2019 Medical Record Number: [id number] Patient Account Number: [id number] Date of Birth/Sex: 06/27/1931 (86 y.o. F) Treating  RN: Primary Care Provider: Einar Pheasant Other Clinician: Referring Provider: Einar Pheasant Treating Provider/Extender: Melburn Hake, Emberlyn Burlison Weeks in Treatment: 1 Diagnosis Coding ICD-10 Codes Code Description O27.741 Pressure ulcer of right buttock, stage 2 I10 Essential (primary) hypertension G47.30 Sleep apnea, unspecified Facility Procedures CPT4 Code: [id number] Description: 99213 - WOUND CARE VISIT-LEV 3 EST PT Modifier: Quantity: 1 Physician Procedures CPT4 Code: [id number] Description: 62836 - WC PHYS LEVEL 3 - EST PT Modifier: Quantity: 1 CPT4 Code: Description: ICD-10 Diagnosis Description O29.476 Pressure ulcer of right buttock, stage 2 I10 Essential (primary) hypertension G47.30 Sleep apnea, unspecified Modifier: Quantity: Electronic Signature(s) Signed: 11/15/2019 3:42:01 PM By: Worthy Keeler PA-C Entered By: Worthy Keeler on 11/15/2019 15:42:00

## 2019-11-15 NOTE — Assessment & Plan Note (Signed)
Controlled on aciphex.

## 2019-11-15 NOTE — Assessment & Plan Note (Signed)
hgb just checked through home health - stable 10.4.  Follow.

## 2019-11-15 NOTE — Assessment & Plan Note (Signed)
Persistent fatigue - discussed covid and residual effects.  Discussed continued rest.  Therapy/exercise.

## 2019-11-15 NOTE — Assessment & Plan Note (Signed)
Blood pressures as outlined.  Continue hctz, amlodipine and benazepril.  Follow pressures.  Follow metabolic panel.

## 2019-11-15 NOTE — Assessment & Plan Note (Signed)
Diagnosed with covid 09/27/19.  Has been treated with steroid nasal spray, saline nasal spray, mucinex, prednisone taper and abx (doxycycline and omnicef).  She is better.  No significant cough now.  Breathing better.  Still fatigued.  She request referral to pulmonary.

## 2019-11-15 NOTE — Assessment & Plan Note (Signed)
Was evaluated at wound clinic.  Healing.  No pain now.  Follow.

## 2019-11-15 NOTE — Assessment & Plan Note (Signed)
Chronic.  Stable.  Has been followed by ortho and pain clinic.

## 2019-11-19 DIAGNOSIS — E7800 Pure hypercholesterolemia, unspecified: Secondary | ICD-10-CM | POA: Diagnosis not present

## 2019-11-19 DIAGNOSIS — D649 Anemia, unspecified: Secondary | ICD-10-CM | POA: Diagnosis not present

## 2019-11-19 DIAGNOSIS — I890 Lymphedema, not elsewhere classified: Secondary | ICD-10-CM | POA: Diagnosis not present

## 2019-11-19 DIAGNOSIS — E039 Hypothyroidism, unspecified: Secondary | ICD-10-CM | POA: Diagnosis not present

## 2019-11-19 DIAGNOSIS — U071 COVID-19: Secondary | ICD-10-CM | POA: Diagnosis not present

## 2019-11-19 DIAGNOSIS — M810 Age-related osteoporosis without current pathological fracture: Secondary | ICD-10-CM | POA: Diagnosis not present

## 2019-11-19 DIAGNOSIS — Z9181 History of falling: Secondary | ICD-10-CM | POA: Diagnosis not present

## 2019-11-19 DIAGNOSIS — Z96653 Presence of artificial knee joint, bilateral: Secondary | ICD-10-CM | POA: Diagnosis not present

## 2019-11-19 DIAGNOSIS — I509 Heart failure, unspecified: Secondary | ICD-10-CM | POA: Diagnosis not present

## 2019-11-19 DIAGNOSIS — I872 Venous insufficiency (chronic) (peripheral): Secondary | ICD-10-CM | POA: Diagnosis not present

## 2019-11-19 DIAGNOSIS — I700 Atherosclerosis of aorta: Secondary | ICD-10-CM | POA: Diagnosis not present

## 2019-11-19 DIAGNOSIS — I110 Hypertensive heart disease with heart failure: Secondary | ICD-10-CM | POA: Diagnosis not present

## 2019-11-19 DIAGNOSIS — I272 Pulmonary hypertension, unspecified: Secondary | ICD-10-CM | POA: Diagnosis not present

## 2019-11-19 DIAGNOSIS — K219 Gastro-esophageal reflux disease without esophagitis: Secondary | ICD-10-CM | POA: Diagnosis not present

## 2019-11-19 DIAGNOSIS — F439 Reaction to severe stress, unspecified: Secondary | ICD-10-CM | POA: Diagnosis not present

## 2019-11-23 DIAGNOSIS — N390 Urinary tract infection, site not specified: Secondary | ICD-10-CM | POA: Diagnosis not present

## 2019-11-23 DIAGNOSIS — N3010 Interstitial cystitis (chronic) without hematuria: Secondary | ICD-10-CM | POA: Diagnosis not present

## 2019-11-30 ENCOUNTER — Telehealth: Payer: Self-pay | Admitting: Internal Medicine

## 2019-11-30 MED ORDER — RABEPRAZOLE SODIUM 20 MG PO TBEC: 20.0000 mg | DELAYED_RELEASE_TABLET | Freq: Every day | ORAL | 2 refills | Status: DC

## 2019-11-30 NOTE — Telephone Encounter (Signed)
Pt needs a refill on RABEprazole (ACIPHEX) 20 MG tablet sent to [facility] she said that [facility] had been filling it but wanted Dr. Nicki Reaper to take over

## 2019-11-30 NOTE — Telephone Encounter (Signed)
rx ok for refill aciphex.

## 2019-11-30 NOTE — Addendum Note (Signed)
Addended by: Alisa Graff on: 11/30/2019 11:34 PM   Modules accepted: Orders

## 2019-12-02 DIAGNOSIS — D225 Melanocytic nevi of trunk: Secondary | ICD-10-CM | POA: Diagnosis not present

## 2019-12-02 DIAGNOSIS — Z1283 Encounter for screening for malignant neoplasm of skin: Secondary | ICD-10-CM | POA: Diagnosis not present

## 2019-12-02 DIAGNOSIS — L821 Other seborrheic keratosis: Secondary | ICD-10-CM | POA: Diagnosis not present

## 2019-12-02 DIAGNOSIS — L243 Irritant contact dermatitis due to cosmetics: Secondary | ICD-10-CM | POA: Diagnosis not present

## 2019-12-02 DIAGNOSIS — D1801 Hemangioma of skin and subcutaneous tissue: Secondary | ICD-10-CM | POA: Diagnosis not present

## 2019-12-02 DIAGNOSIS — L89310 Pressure ulcer of right buttock, unstageable: Secondary | ICD-10-CM | POA: Diagnosis not present

## 2019-12-02 DIAGNOSIS — L988 Other specified disorders of the skin and subcutaneous tissue: Secondary | ICD-10-CM | POA: Diagnosis not present

## 2019-12-02 DIAGNOSIS — L853 Xerosis cutis: Secondary | ICD-10-CM | POA: Diagnosis not present

## 2019-12-02 DIAGNOSIS — L812 Freckles: Secondary | ICD-10-CM | POA: Diagnosis not present

## 2019-12-06 DIAGNOSIS — M48061 Spinal stenosis, lumbar region without neurogenic claudication: Secondary | ICD-10-CM | POA: Diagnosis not present

## 2019-12-06 DIAGNOSIS — M519 Unspecified thoracic, thoracolumbar and lumbosacral intervertebral disc disorder: Secondary | ICD-10-CM | POA: Diagnosis not present

## 2019-12-06 DIAGNOSIS — Z79899 Other long term (current) drug therapy: Secondary | ICD-10-CM | POA: Diagnosis not present

## 2019-12-06 DIAGNOSIS — Q675 Congenital deformity of spine: Secondary | ICD-10-CM | POA: Diagnosis not present

## 2019-12-06 DIAGNOSIS — M5416 Radiculopathy, lumbar region: Secondary | ICD-10-CM | POA: Diagnosis not present

## 2019-12-06 DIAGNOSIS — S76312D Strain of muscle, fascia and tendon of the posterior muscle group at thigh level, left thigh, subsequent encounter: Secondary | ICD-10-CM | POA: Diagnosis not present

## 2019-12-06 DIAGNOSIS — K5903 Drug induced constipation: Secondary | ICD-10-CM | POA: Diagnosis not present

## 2019-12-07 DIAGNOSIS — L89310 Pressure ulcer of right buttock, unstageable: Secondary | ICD-10-CM | POA: Diagnosis not present

## 2019-12-07 DIAGNOSIS — R399 Unspecified symptoms and signs involving the genitourinary system: Secondary | ICD-10-CM | POA: Diagnosis not present

## 2019-12-08 DIAGNOSIS — E860 Dehydration: Secondary | ICD-10-CM | POA: Diagnosis not present

## 2019-12-10 ENCOUNTER — Other Ambulatory Visit: Payer: Self-pay

## 2019-12-10 ENCOUNTER — Ambulatory Visit: Payer: PPO | Admitting: Critical Care Medicine

## 2019-12-10 ENCOUNTER — Encounter: Payer: Self-pay | Admitting: Internal Medicine

## 2019-12-10 ENCOUNTER — Encounter: Payer: Self-pay | Admitting: Critical Care Medicine

## 2019-12-10 VITALS — BP 140/62 | HR 64 | Temp 98.7°F | Ht 62.0 in | Wt 183.0 lb

## 2019-12-10 DIAGNOSIS — R0600 Dyspnea, unspecified: Secondary | ICD-10-CM

## 2019-12-10 DIAGNOSIS — R059 Cough, unspecified: Secondary | ICD-10-CM

## 2019-12-10 DIAGNOSIS — J9611 Chronic respiratory failure with hypoxia: Secondary | ICD-10-CM

## 2019-12-10 DIAGNOSIS — R05 Cough: Secondary | ICD-10-CM

## 2019-12-10 DIAGNOSIS — Z8616 Personal history of COVID-19: Secondary | ICD-10-CM | POA: Diagnosis not present

## 2019-12-10 MED ORDER — BUDESONIDE-FORMOTEROL FUMARATE 160-4.5 MCG/ACT IN AERO: 2.0000 | INHALATION_SPRAY | Freq: Two times a day (BID) | RESPIRATORY_TRACT | 6 refills | Status: DC

## 2019-12-10 NOTE — Progress Notes (Signed)
Synopsis: Referred in April 2021 for post- covid by Einar Pheasant, MD.  Subjective:   PATIENT ID: Kimberly Roy GENDER: female DOB: [birth year], MRN: [id number]  Chief Complaint  Patient presents with  . Consult    Patient had Covid in Jan. Patient was given albuterol inhaler, also went to respiratory clinic and treated with omnicef. Patient has hx of anemia. Patient has a lot of fatigue. Patient walks around with a walker short distances. Patient wears oxygen at night 2 liters. Daughter has been living with her for last 2 years and feels like she is getting worse. Oxygen has been dropping during the day into 80's sometimes 70's. dry Cough sometimes.     Kimberly Roy is an 86 y/o woman who presents for evaluation of chronic dyspnea since having Covid in January 2021. She is accompanied by her daughter Kimberly Roy. During the acute phase of the illness she had headache, shortness of breath, lost of taste, severe fatigue.  Her symptoms have improved somewhat, but she has persistent shortness of breath.  She has been on chronic supplemental oxygen at night at 2 L for many years, but she is unsure why.  Due to back pain she sleeps in recliner.  In the last few months when she wakes up in the morning she will walk across her living room to check her saturations and she is often in the upper 70s or early 52s.  There is sometimes quite a bit of fluctuation.  Her saturations will drop throughout the day when she is walking around her house.  She has to walk with a walker and WC due to limited mobility, which pre-dates covid.  Prior to Covid she felt that her breathing was fairly normal.  She initially was having wheezing and was prescribed an albuterol inhaler about 6 weeks ago, but her wheezing has mostly improved.  She continues to use her albuterol inhaler most days, notes that it does make her breathing feel better.  She has not been prescribed inhalers in the past.  She has a dry cough; she previously had sputum  production which improved with antibiotics that she took last month.  Her cough is worsened again since the antibiotics were stopped.  No fever, chills, sweats, chest pain.  She has chronic heartburn and leg edema which are at baseline.  She has no previous history of asthma or COPD.    Past Medical History:  Diagnosis Date  . Anemia   . Anxiety   . Chest pain   . CHF (congestive heart failure) ([place])   . Constipation   . DDD (degenerative disc disease), cervical   . Depression   . DVT (deep venous thrombosis) ([place])   . Dysphonia   . Dyspnea   . Fatty liver   . Fatty liver   . Headache   . Hyperlipidemia   . Hyperpiesia   . Hypertension   . Hypothyroidism   . Interstitial cystitis   . Left ventricular dysfunction   . Lymphedema   . Nephrolithiasis   . Obstructive sleep apnea   . Osteoarthritis    knees/cervical and lumbar spine  . Pulmonary hypertension ([place])   . Pulmonary nodules    followed by Dr Raul Del  . Pure hypercholesterolemia   . Renal cyst    right     Family History  Problem Relation Age of Onset  . Heart disease Mother   . Stroke Mother   . Hypertension Mother   . Heart disease Father  myocardial infarction age 76  . Breast cancer Neg Hx      Past Surgical History:  Procedure Laterality Date  . ABDOMINAL HYSTERECTOMY     ovaries left in place  . APPENDECTOMY    . Back Surgeries    . BACK SURGERY    . BREAST REDUCTION SURGERY     3/99  . CARDIAC CATHETERIZATION    . cataracts Bilateral   . CERVICAL SPINE SURGERY    . ESOPHAGEAL MANOMETRY N/A 08/02/2015   Procedure: ESOPHAGEAL MANOMETRY (EM);  Surgeon: Josefine Class, MD;  Location: [facility] ENDOSCOPY;  Service: Endoscopy;  Laterality: N/A;  . ESOPHAGOGASTRODUODENOSCOPY N/A 02/27/2015   Procedure: ESOPHAGOGASTRODUODENOSCOPY (EGD);  Surgeon: Hulen Luster, MD;  Location: [facility] ENDOSCOPY;  Service: Gastroenterology;  Laterality: N/A;  . ESOPHAGOGASTRODUODENOSCOPY (EGD) WITH PROPOFOL N/A  10/30/2018   Procedure: ESOPHAGOGASTRODUODENOSCOPY (EGD) WITH PROPOFOL;  Surgeon: Lollie Sails, MD;  Location: [facility] ENDOSCOPY;  Service: Endoscopy;  Laterality: N/A;  . EXCISIONAL HEMORRHOIDECTOMY    . EYE SURGERY    . FRACTURE SURGERY    . HEMORRHOID SURGERY    . HIP SURGERY  2013   Right hip surgery  . JOINT REPLACEMENT    . KNEE ARTHROSCOPY     left and right  . ORIF FEMUR FRACTURE Right 08/07/2018   Procedure: OPEN REDUCTION INTERNAL FIXATION (ORIF) DISTAL FEMUR FRACTURE;  Surgeon: Hessie Knows, MD;  Location: [facility] ORS;  Service: Orthopedics;  Laterality: Right;  . REDUCTION MAMMAPLASTY Bilateral YRS AGO  . REPLACEMENT TOTAL KNEE Bilateral   . rotator cuff surgery     blilateral  . TONSILECTOMY/ADENOIDECTOMY WITH MYRINGOTOMY    . VISCERAL ARTERY INTERVENTION N/A 02/25/2019   Procedure: VISCERAL ARTERY INTERVENTION;  Surgeon: Algernon Huxley, MD;  Location: [place] [facility];  Service: Cardiovascular;  Laterality: N/A;    Social History   Socioeconomic History  . Marital status: Widowed    Spouse name: Not on file  . Number of children: 2  . Years of education: Not on file  . Highest education level: Not on file  Occupational History    Comment: insurance agency  Tobacco Use  . Smoking status: Never Smoker  . Smokeless tobacco: Never Used  Substance and Sexual Activity  . Alcohol use: No    Alcohol/week: 0.0 standard drinks  . Drug use: Never  . Sexual activity: Not Currently  Other Topics Concern  . Not on file  Social History Narrative  . Not on file   Social Determinants of Health   Financial Resource Strain:   . Difficulty of Paying Living Expenses:   Food Insecurity:   . Worried About Charity fundraiser in the Last Year:   . Arboriculturist in the Last Year:   Transportation Needs:   . Film/video editor (Medical):   Marland Kitchen Lack of Transportation (Non-Medical):   Physical Activity:   . Days of Exercise per Week:   . Minutes of Exercise per Session:    Stress:   . Feeling of Stress :   Social Connections:   . Frequency of Communication with Friends and Family:   . Frequency of Social Gatherings with Friends and Family:   . Attends Religious Services:   . Active Member of Clubs or Organizations:   . Attends Archivist Meetings:   Marland Kitchen Marital Status:   Intimate Partner Violence:   . Fear of Current or Ex-Partner:   . Emotionally Abused:   Marland Kitchen Physically Abused:   .  Sexually Abused:      Allergies  Allergen Reactions  . Lyrica [Pregabalin] Swelling  . Omnicef [Cefdinir] Diarrhea and Nausea And Vomiting  . Atarax [Hydroxyzine]     jittery  . Dicyclomine Other (See Comments)    Abdominal bloating   . Hydroxyzine Hcl     jittery  . Levaquin [Levofloxacin] Swelling  . Nucynta Er [Tapentadol Hcl Er] Other (See Comments)    Severe constipation   . Oxybutynin Other (See Comments)    Blurred vision  . Zoloft [Sertraline Hcl]     Severe headache  . Biaxin [Clarithromycin] Other (See Comments) and Rash    Pt does not remember Pt does not remember  . Sertraline Nausea And Vomiting    Severe headache Severe headache Other reaction(s): Headache Severe headache  . Sulfa Antibiotics Rash    Pt does not remember  . Sulfasalazine Rash    Pt does not remember  . Tape Rash    [place] - redness  . Tapentadol Other (See Comments) and Rash    _0      Immunization History  Administered Date(s) Administered  . Influenza Split 07/13/2009, 07/03/2012  . Influenza, High Dose Seasonal PF 05/13/2016, 06/11/2017, 06/05/2018, 06/07/2019  . Influenza,inj,Quad PF,6+ Mos 05/25/2013, 05/31/2014, 04/27/2015  . Pneumococcal Conjugate-13 11/29/2014  . Pneumococcal-Unspecified 06/22/2001  . Tdap 09/17/2017  . Zoster Recombinat (Shingrix) 06/07/2019    Outpatient Medications Prior to Visit  Medication Sig Dispense Refill  . albuterol  (VENTOLIN HFA) 108 (90 Base) MCG/ACT inhaler INHALE 2 PUFFS INTO THE LUNGS EVERY 6 (SIX) HOURS AS NEEDED FOR WHEEZING OR SHORTNESS OF BREATH 18 g 0  . aluminum-magnesium hydroxide-simethicone (MAALOX) 758-832-54 MG/5ML SUSP Take 15 mLs by mouth 4 (four) times daily -  before meals and at bedtime.     Marland Kitchen amLODipine (NORVASC) 10 MG tablet TAKE 1 TABLET BY MOUTH DAILY 90 tablet 1  . atorvastatin (LIPITOR) 20 MG tablet TAKE 1 TABLET BY MOUTH AT BEDTIME 90 tablet 1  . azelastine (ASTELIN) 0.1 % nasal spray Place 1 spray into both nostrils 2 (two) times daily. Use in each nostril as directed 30 mL 1  . benazepril (LOTENSIN) 40 MG tablet TAKE 1 TABLET BY MOUTH DAILY 90 tablet 1  . busPIRone (BUSPAR) 10 MG tablet Take 10 mg by mouth 2 (two) times daily.     . calcium citrate-vitamin D (CITRACAL+D) 315-200 MG-UNIT tablet Take 1 tablet by mouth daily.    [name] Sodium 30-100 MG CAPS Take 1 capsule by mouth daily.     . Cholecalciferol (VITAMIN D3) 1000 UNITS CAPS Take by mouth.    . DULoxetine (CYMBALTA) 60 MG capsule TAKE 1 CAPSULE BY MOUTH EVERY DAY. 30 capsule 3  . furosemide (LASIX) 20 MG tablet TAKE 1 TABLET BY MOUTH DAILY AS NEEDED 90 tablet 0  . gabapentin (NEURONTIN) 300 MG capsule Take 2 capsules (600 mg total) by mouth 3 (three) times daily. 540 capsule 2  . hydrochlorothiazide (MICROZIDE) 12.5 MG capsule Take 12.5 mg by mouth daily.    Marland Kitchen HYDROcodone-acetaminophen (NORCO) 10-325 MG tablet Take 1 tablet by mouth 3 (three) times daily. 10 tablet 0  . magnesium oxide (MAG-OX) 400 MG tablet Take 1 tablet (400 mg total) by mouth daily. 30 tablet 1  . metoprolol succinate (TOPROL-XL) 100 MG 24 hr tablet Take 100 mg by mouth 2 (two) times daily.    Marland Kitchen morphine (MS CONTIN) 60 MG 12 hr tablet Take  1 tablet (60 mg total) by mouth 2 (two) times daily. 10 tablet 0  . Naldemedine Tosylate (SYMPROIC) 0.2 MG TABS Take 0.2 mg by mouth daily.    Marland Kitchen nystatin cream (MYCOSTATIN) Apply 1 application  topically daily as needed. 30 g 0  . polyethylene glycol powder (GLYCOLAX/MIRALAX) 17 GM/SCOOP powder MIX 17 GRAMS AS MARKED ON BOTTLE TOP IN 8 OUNCES OF WATER AND DRINK ONCE A DAY AS DIRECTED. 527 g 0  . Probiotic Product (ALIGN PO) Take by mouth daily.    . RABEprazole (ACIPHEX) 20 MG tablet Take 1 tablet (20 mg total) by mouth daily. 30 tablet 2  . senna (SENOKOT) 8.6 MG TABS tablet Take 1 tablet by mouth 2 (two) times daily.    . Simethicone (GAS-X PO) Take 2 tablets by mouth 2 (two) times daily.    . sodium chloride (OCEAN) 0.65 % nasal spray Place 1 spray into the nose as needed.    Marland Kitchen SYNTHROID 100 MCG tablet TAKE 1 TABLET BY MOUTH DAILY 90 tablet 3  . traZODone (DESYREL) 50 MG tablet Take 25-50 mg by mouth at bedtime as needed.    . triamcinolone cream (KENALOG) 0.1 %     . cefdinir (OMNICEF) 300 MG capsule Take 1 capsule (300 mg total) by mouth daily. 20 capsule 0   No facility-administered medications prior to visit.    Review of Systems  Constitutional: Negative for chills and fever.  HENT: Negative for congestion.   Respiratory: Positive for cough and shortness of breath. Negative for sputum production and wheezing.   Cardiovascular: Negative for chest pain and leg swelling.  Gastrointestinal: Negative for heartburn, nausea and vomiting.     Objective:   Vitals:   12/10/19 1620  BP: 140/62  Pulse: 64  Temp: 98.7 F (37.1 C)  TempSrc: Temporal  SpO2: 91%  Weight: 183 lb (83 kg)  Height: _0  (1.575 m)   91% on   RA BMI Readings from Last 3 Encounters:  12/10/19 33.47 kg/m  11/12/19 32.37 kg/m  11/12/19 32.37 kg/m   Wt Readings from Last 3 Encounters:  12/10/19 183 lb (83 kg)  11/12/19 177 lb (80.3 kg)  11/12/19 177 lb (80.3 kg)    Physical Exam Vitals reviewed.  Constitutional:      General: She is not in acute distress.    Appearance: She is obese.     Comments: Frail and chronically ill-appearing.  Sitting in a wheelchair.  HENT:     Head:  Normocephalic and atraumatic.  Eyes:     General: No scleral icterus. Cardiovascular:     Rate and Rhythm: Normal rate and regular rhythm.  Pulmonary:     Comments: Bilateral rales posteriorly, clear to auscultation anteriorly.  No wheezing or rhonchi.  No conversational dyspnea. Abdominal:     General: There is no distension.     Palpations: Abdomen is soft.     Tenderness: There is no abdominal tenderness.  Musculoskeletal:        General: No swelling.     Cervical back: Neck supple.     Comments: Kyphosis  Lymphadenopathy:     Cervical: No cervical adenopathy.  Skin:    General: Skin is warm and dry.     Findings: No rash.  Neurological:     Mental Status: She is alert.     Comments: Globally weak  Psychiatric:        Mood and Affect: Mood normal.        Behavior: Behavior  normal.      CBC    Component Value Date/Time   WBC 14.4 11/07/2019 0000   WBC 6.3 06/30/2019 1221   RBC 3.18 (A) 11/07/2019 0000   HGB 10.4 (A) 11/07/2019 0000   HGB 10.4 (L) 10/29/2019 0000   HCT 31 (A) 11/07/2019 0000   HCT 30.8 (L) 10/29/2019 0000   PLT 325 11/07/2019 0000   PLT 284 10/29/2019 0000   MCV 95 10/29/2019 0000   MCV 94 04/18/2014 0944   MCH 32.2 10/29/2019 0000   MCH 31.5 11/03/2018 1319   MCHC 33.8 10/29/2019 0000   MCHC 33.1 06/30/2019 1221   RDW 12.5 10/29/2019 0000   RDW 14.8 (H) 04/18/2014 0944   LYMPHSABS 2.0 10/29/2019 0000   LYMPHSABS 2.4 04/18/2014 0944   MONOABS 0.6 06/30/2019 1221   MONOABS 0.6 04/18/2014 0944   EOSABS 0.3 10/29/2019 0000   EOSABS 0.4 04/18/2014 0944   BASOSABS 0.0 10/29/2019 0000   BASOSABS 0.0 04/18/2014 0944    CHEMISTRY No results for input(s): NA, K, CL, CO2, GLUCOSE, BUN, CREATININE, CALCIUM, MG, PHOS in the last 168 hours. CrCl cannot be calculated (Patient's most recent lab result is older than the maximum 21 days allowed.).   Chest Imaging- films reviewed: CXR 1 view 10/29/2019-cardiomegaly, increased lung markings  bilaterally  Pulmonary Functions Testing Results: No flowsheet data found.      Assessment & Plan:     ICD-10-CM   1. Dyspnea, unspecified type  R06.00 Pulse oximetry, overnight    Pulmonary function test    CT Chest High Resolution  2. History of COVID-19  Z86.16 Pulse oximetry, overnight    Pulmonary function test    CT Chest High Resolution  3. Cough  R05 Pulse oximetry, overnight    Pulmonary function test    CT Chest High Resolution  4. Chronic respiratory failure with hypoxia (HCC)  J96.11     Chronic DOE with wheezing and cough since covid 19 viral pneumonia. -HRCT chest -PFTs; discussed with the patient and her daughter that these may be difficult to interpret with kyphosis and at her age. -Trial of Symbicort twice daily  Chronic hypoxic respiratory failure -Overnight oximetry due to concern for nocturnal hypoxia with low morning saturations. -Walked in the office today to determine if she had exertional desaturations, which she did.   RTC in 1 month.   Current Outpatient Medications:  .  albuterol (VENTOLIN HFA) 108 (90 Base) MCG/ACT inhaler, INHALE 2 PUFFS INTO THE LUNGS EVERY 6 (SIX) HOURS AS NEEDED FOR WHEEZING OR SHORTNESS OF BREATH, Disp: 18 g, Rfl: 0 .  aluminum-magnesium hydroxide-simethicone (MAALOX) 449-675-91 MG/5ML SUSP, Take 15 mLs by mouth 4 (four) times daily -  before meals and at bedtime. , Disp: , Rfl:  .  amLODipine (NORVASC) 10 MG tablet, TAKE 1 TABLET BY MOUTH DAILY, Disp: 90 tablet, Rfl: 1 .  atorvastatin (LIPITOR) 20 MG tablet, TAKE 1 TABLET BY MOUTH AT BEDTIME, Disp: 90 tablet, Rfl: 1 .  azelastine (ASTELIN) 0.1 % nasal spray, Place 1 spray into both nostrils 2 (two) times daily. Use in each nostril as directed, Disp: 30 mL, Rfl: 1 .  benazepril (LOTENSIN) 40 MG tablet, TAKE 1 TABLET BY MOUTH DAILY, Disp: 90 tablet, Rfl: 1 .  busPIRone (BUSPAR) 10 MG tablet, Take 10 mg by mouth 2 (two) times daily. , Disp: , Rfl:  .  calcium citrate-vitamin D  (CITRACAL+D) 315-200 MG-UNIT tablet, Take 1 tablet by mouth daily., Disp: , Rfl:  .  Casanthranol-Docusate Sodium 30-100 MG CAPS, Take 1 capsule by mouth daily. , Disp: , Rfl:  .  Cholecalciferol (VITAMIN D3) 1000 UNITS CAPS, Take by mouth., Disp: , Rfl:  .  DULoxetine (CYMBALTA) 60 MG capsule, TAKE 1 CAPSULE BY MOUTH EVERY DAY., Disp: 30 capsule, Rfl: 3 .  furosemide (LASIX) 20 MG tablet, TAKE 1 TABLET BY MOUTH DAILY AS NEEDED, Disp: 90 tablet, Rfl: 0 .  gabapentin (NEURONTIN) 300 MG capsule, Take 2 capsules (600 mg total) by mouth 3 (three) times daily., Disp: 540 capsule, Rfl: 2 .  hydrochlorothiazide (MICROZIDE) 12.5 MG capsule, Take 12.5 mg by mouth daily., Disp: , Rfl:  .  HYDROcodone-acetaminophen (NORCO) 10-325 MG tablet, Take 1 tablet by mouth 3 (three) times daily., Disp: 10 tablet, Rfl: 0 .  magnesium oxide (MAG-OX) 400 MG tablet, Take 1 tablet (400 mg total) by mouth daily., Disp: 30 tablet, Rfl: 1 .  metoprolol succinate (TOPROL-XL) 100 MG 24 hr tablet, Take 100 mg by mouth 2 (two) times daily., Disp: , Rfl:  .  morphine (MS CONTIN) 60 MG 12 hr tablet, Take 1 tablet (60 mg total) by mouth 2 (two) times daily., Disp: 10 tablet, Rfl: 0 .  Naldemedine Tosylate (SYMPROIC) 0.2 MG TABS, Take 0.2 mg by mouth daily., Disp: , Rfl:  .  nystatin cream (MYCOSTATIN), Apply 1 application topically daily as needed., Disp: 30 g, Rfl: 0 .  polyethylene glycol powder (GLYCOLAX/MIRALAX) 17 GM/SCOOP powder, MIX 17 GRAMS AS MARKED ON BOTTLE TOP IN 8 OUNCES OF WATER AND DRINK ONCE A DAY AS DIRECTED., Disp: 527 g, Rfl: 0 .  Probiotic Product (ALIGN PO), Take by mouth daily., Disp: , Rfl:  .  RABEprazole (ACIPHEX) 20 MG tablet, Take 1 tablet (20 mg total) by mouth daily., Disp: 30 tablet, Rfl: 2 .  senna (SENOKOT) 8.6 MG TABS tablet, Take 1 tablet by mouth 2 (two) times daily., Disp: , Rfl:  .  Simethicone (GAS-X PO), Take 2 tablets by mouth 2 (two) times daily., Disp: , Rfl:  .  sodium chloride (OCEAN) 0.65  % nasal spray, Place 1 spray into the nose as needed., Disp: , Rfl:  .  SYNTHROID 100 MCG tablet, TAKE 1 TABLET BY MOUTH DAILY, Disp: 90 tablet, Rfl: 3 .  traZODone (DESYREL) 50 MG tablet, Take 25-50 mg by mouth at bedtime as needed., Disp: , Rfl:  .  triamcinolone cream (KENALOG) 0.1 %, , Disp: , Rfl:     Julian Hy, DO [facility] 12/10/2019 4:26 PM

## 2019-12-10 NOTE — Patient Instructions (Addendum)
Thank you for visiting Dr. Carlis Abbott at [facility]. We recommend the following: Orders Placed This Encounter  Procedures  . CT Chest High Resolution  . Pulse oximetry, overnight  . Pulmonary function test   Orders Placed This Encounter  Procedures  . CT Chest High Resolution    First available    Standing Status:   Future    Standing Expiration Date:   02/08/2021    Order Specific Question:   ** REASON FOR EXAM (FREE TEXT)    Answer:   post-covid    Order Specific Question:   Preferred imaging location?    Answer:   [facility]    Order Specific Question:   Radiology Contrast Protocol - do NOT remove file path    Answer:   [email or web address]  . Pulse oximetry, overnight    Standing Status:   Future    Standing Expiration Date:   12/09/2020  . Pulmonary function test    Standing Status:   Future    Standing Expiration Date:   12/09/2020    Order Specific Question:   Where should this test be performed?    Answer:   [facility]    Order Specific Question:   Full PFT: includes the following: basic spirometry, spirometry pre & post bronchodilator, diffusion capacity (DLCO), lung volumes    Answer:   Full PFT    Meds ordered this encounter  Medications  . budesonide-formoterol (SYMBICORT) 160-4.5 MCG/ACT inhaler    Sig: Inhale 2 puffs into the lungs 2 (two) times daily.    Dispense:  1 Inhaler    Refill:  6    Return in about 4 weeks (around 01/07/2020).    Please do your part to reduce the spread of COVID-19.

## 2019-12-13 DIAGNOSIS — I509 Heart failure, unspecified: Secondary | ICD-10-CM | POA: Diagnosis not present

## 2019-12-13 DIAGNOSIS — I872 Venous insufficiency (chronic) (peripheral): Secondary | ICD-10-CM | POA: Diagnosis not present

## 2019-12-13 DIAGNOSIS — U071 COVID-19: Secondary | ICD-10-CM | POA: Diagnosis not present

## 2019-12-13 DIAGNOSIS — I890 Lymphedema, not elsewhere classified: Secondary | ICD-10-CM | POA: Diagnosis not present

## 2019-12-13 DIAGNOSIS — I272 Pulmonary hypertension, unspecified: Secondary | ICD-10-CM | POA: Diagnosis not present

## 2019-12-13 DIAGNOSIS — E7800 Pure hypercholesterolemia, unspecified: Secondary | ICD-10-CM | POA: Diagnosis not present

## 2019-12-13 DIAGNOSIS — I700 Atherosclerosis of aorta: Secondary | ICD-10-CM | POA: Diagnosis not present

## 2019-12-13 DIAGNOSIS — F439 Reaction to severe stress, unspecified: Secondary | ICD-10-CM | POA: Diagnosis not present

## 2019-12-13 DIAGNOSIS — Z96653 Presence of artificial knee joint, bilateral: Secondary | ICD-10-CM | POA: Diagnosis not present

## 2019-12-13 DIAGNOSIS — K219 Gastro-esophageal reflux disease without esophagitis: Secondary | ICD-10-CM | POA: Diagnosis not present

## 2019-12-13 DIAGNOSIS — D649 Anemia, unspecified: Secondary | ICD-10-CM | POA: Diagnosis not present

## 2019-12-13 DIAGNOSIS — Z9181 History of falling: Secondary | ICD-10-CM | POA: Diagnosis not present

## 2019-12-13 DIAGNOSIS — E039 Hypothyroidism, unspecified: Secondary | ICD-10-CM | POA: Diagnosis not present

## 2019-12-13 DIAGNOSIS — I110 Hypertensive heart disease with heart failure: Secondary | ICD-10-CM | POA: Diagnosis not present

## 2019-12-13 DIAGNOSIS — M810 Age-related osteoporosis without current pathological fracture: Secondary | ICD-10-CM | POA: Diagnosis not present

## 2019-12-14 NOTE — Telephone Encounter (Signed)
Pt states that she is taking two a day and would like the directions to be corrected and sent to the pharmacy? Please advise

## 2019-12-15 MED ORDER — RABEPRAZOLE SODIUM 20 MG PO TBEC: 20.0000 mg | DELAYED_RELEASE_TABLET | Freq: Two times a day (BID) | ORAL | 2 refills | Status: DC

## 2019-12-15 NOTE — Telephone Encounter (Signed)
rx sent in for aciphex bid #[phone] with 2 refills.

## 2019-12-15 NOTE — Addendum Note (Signed)
Addended by: Alisa Graff on: 12/15/2019 04:59 AM   Modules accepted: Orders

## 2019-12-16 DIAGNOSIS — N3010 Interstitial cystitis (chronic) without hematuria: Secondary | ICD-10-CM | POA: Diagnosis not present

## 2019-12-16 DIAGNOSIS — R809 Proteinuria, unspecified: Secondary | ICD-10-CM | POA: Diagnosis not present

## 2019-12-17 ENCOUNTER — Ambulatory Visit: Payer: PPO | Admitting: Nurse Practitioner

## 2019-12-17 ENCOUNTER — Other Ambulatory Visit: Payer: Self-pay

## 2019-12-17 VITALS — BP 146/80 | HR 67 | Temp 97.9°F | Ht 62.0 in | Wt 189.0 lb

## 2019-12-17 DIAGNOSIS — K219 Gastro-esophageal reflux disease without esophagitis: Secondary | ICD-10-CM | POA: Diagnosis not present

## 2019-12-17 DIAGNOSIS — R490 Dysphonia: Secondary | ICD-10-CM | POA: Diagnosis not present

## 2019-12-17 DIAGNOSIS — R1084 Generalized abdominal pain: Secondary | ICD-10-CM

## 2019-12-17 DIAGNOSIS — R1310 Dysphagia, unspecified: Secondary | ICD-10-CM | POA: Diagnosis not present

## 2019-12-17 NOTE — Progress Notes (Signed)
Established Patient Office Visit  Subjective:  Patient ID: Kimberly Roy, female    DOB: November 07, 1930  Age: 86 y.o. MRN: [id number]  CC:  Chief Complaint  Patient presents with  . Gastroesophageal Reflux    HPI Kimberly Roy is an 86 yo with history of esophageal Candida, atrophic gastritis, atonic pylorus, esophageal dysmotility per EGD 2020. She presents today for dysphagia.  She was hospitalized in January for Covid.  She was treated with albuterol inhaler and Omnicef and was seen by pulmonology on 12/10/2019.  She has been reporting fatigue, using a walker, using oxygen 2 L at night before Covid.  She feels like she has been getting worse.  Her O2 sats are dropping during the day and she has had SOB, wheezing- that improved, Due to back pain she has been sleeping in a recliner. Her chronic reflux is getting worse. A chest CT has been ordered by her Pulmonologist . She has chronic stable anemia. Hgb 10.4.  Chief complaint: After she eats,  she swallows and clears throat constantly and feels like something is in her throat that she can't get out. She feels like there is fluid running down the back of her throat.  Yet, she has no nasal congestion or post nasal drip. Using Flonase for seasonal allergies. She does not feel like food is coming back up the esophagus.  She feels like food and liquid pass down the esophagus fine.  In fact, she gets up in the morning, takes a whole handful of pills,  eats a muffin and that passes fine.  A few minutes or so later, she starts to have throat clearing repetitively. She is swallowing saliva and beverages fine.  She can't get her voice to clear up- hoarseness. Since, Covid, she has a non- productive cough and has had  chest discomfort. She is on  Aciphex  BID and Gas X .  She has had no nausea or vomiting.  No food impactions.  No aspiration.  Bowel movements are regular.  She gets a pain periodically in the lower abdomen and that has been evaluated in the past without  definite cause.  Kimberly Roy  would like to have a second gastroenterology opinion on her esophagus and abdominal pain.  She does have interstitial cystitis and plans to see her urologist, Dr. Amalia Hailey in [place] next week. No dysuria.    Past Medical History:  Diagnosis Date  . Anemia   . Anxiety   . Chest pain   . CHF (congestive heart failure) ([place])   . Constipation   . DDD (degenerative disc disease), cervical   . Depression   . DVT (deep venous thrombosis) ([place])   . Dysphonia   . Dyspnea   . Fatty liver   . Fatty liver   . Headache   . Hyperlipidemia   . Hyperpiesia   . Hypertension   . Hypothyroidism   . Interstitial cystitis   . Left ventricular dysfunction   . Lymphedema   . Nephrolithiasis   . Obstructive sleep apnea   . Osteoarthritis    knees/cervical and lumbar spine  . Pulmonary hypertension ([place])   . Pulmonary nodules    followed by Dr Raul Del  . Pure hypercholesterolemia   . Renal cyst    right    Past Surgical History:  Procedure Laterality Date  . ABDOMINAL HYSTERECTOMY     ovaries left in place  . APPENDECTOMY    . Back Surgeries    . BACK  SURGERY    . BREAST REDUCTION SURGERY     3/99  . CARDIAC CATHETERIZATION    . cataracts Bilateral   . CERVICAL SPINE SURGERY    . ESOPHAGEAL MANOMETRY N/A 08/02/2015   Procedure: ESOPHAGEAL MANOMETRY (EM);  Surgeon: Josefine Class, MD;  Location: [facility] ENDOSCOPY;  Service: Endoscopy;  Laterality: N/A;  . ESOPHAGOGASTRODUODENOSCOPY N/A 02/27/2015   Procedure: ESOPHAGOGASTRODUODENOSCOPY (EGD);  Surgeon: Hulen Luster, MD;  Location: [facility] ENDOSCOPY;  Service: Gastroenterology;  Laterality: N/A;  . ESOPHAGOGASTRODUODENOSCOPY (EGD) WITH PROPOFOL N/A 10/30/2018   Procedure: ESOPHAGOGASTRODUODENOSCOPY (EGD) WITH PROPOFOL;  Surgeon: Lollie Sails, MD;  Location: [facility] ENDOSCOPY;  Service: Endoscopy;  Laterality: N/A;  . EXCISIONAL HEMORRHOIDECTOMY    . EYE SURGERY    . FRACTURE SURGERY    . HEMORRHOID SURGERY      . HIP SURGERY  2013   Right hip surgery  . JOINT REPLACEMENT    . KNEE ARTHROSCOPY     left and right  . ORIF FEMUR FRACTURE Right 08/07/2018   Procedure: OPEN REDUCTION INTERNAL FIXATION (ORIF) DISTAL FEMUR FRACTURE;  Surgeon: Hessie Knows, MD;  Location: [facility] ORS;  Service: Orthopedics;  Laterality: Right;  . REDUCTION MAMMAPLASTY Bilateral YRS AGO  . REPLACEMENT TOTAL KNEE Bilateral   . rotator cuff surgery     blilateral  . TONSILECTOMY/ADENOIDECTOMY WITH MYRINGOTOMY    . VISCERAL ARTERY INTERVENTION N/A 02/25/2019   Procedure: VISCERAL ARTERY INTERVENTION;  Surgeon: Algernon Huxley, MD;  Location: [facility];  Service: Cardiovascular;  Laterality: N/A;    Family History  Problem Relation Age of Onset  . Heart disease Mother   . Stroke Mother   . Hypertension Mother   . Heart disease Father        myocardial infarction age 28  . Breast cancer Neg Hx     Social History   Socioeconomic History  . Marital status: Widowed    Spouse name: Not on file  . Number of children: 2  . Years of education: Not on file  . Highest education level: Not on file  Occupational History    Comment: insurance agency  Tobacco Use  . Smoking status: Never Smoker  . Smokeless tobacco: Never Used  Substance and Sexual Activity  . Alcohol use: No    Alcohol/week: 0.0 standard drinks  . Drug use: Never  . Sexual activity: Not Currently  Other Topics Concern  . Not on file  Social History Narrative  . Not on file   Social Determinants of Health   Financial Resource Strain:   . Difficulty of Paying Living Expenses:   Food Insecurity:   . Worried About Charity fundraiser in the Last Year:   . Arboriculturist in the Last Year:   Transportation Needs:   . Film/video editor (Medical):   Marland Kitchen Lack of Transportation (Non-Medical):   Physical Activity:   . Days of Exercise per Week:   . Minutes of Exercise per Session:   Stress:   . Feeling of Stress :   Social Connections:    . Frequency of Communication with Friends and Family:   . Frequency of Social Gatherings with Friends and Family:   . Attends Religious Services:   . Active Member of Clubs or Organizations:   . Attends Archivist Meetings:   Marland Kitchen Marital Status:   Intimate Partner Violence:   . Fear of Current or Ex-Partner:   . Emotionally Abused:   Marland Kitchen Physically  Abused:   . Sexually Abused:     Outpatient Medications Prior to Visit  Medication Sig Dispense Refill  . albuterol (VENTOLIN HFA) 108 (90 Base) MCG/ACT inhaler INHALE 2 PUFFS INTO THE LUNGS EVERY 6 (SIX) HOURS AS NEEDED FOR WHEEZING OR SHORTNESS OF BREATH 18 g 0  . aluminum-magnesium hydroxide-simethicone (MAALOX) 916-384-66 MG/5ML SUSP Take 15 mLs by mouth 4 (four) times daily -  before meals and at bedtime.     Marland Kitchen amLODipine (NORVASC) 10 MG tablet TAKE 1 TABLET BY MOUTH DAILY 90 tablet 1  . atorvastatin (LIPITOR) 20 MG tablet TAKE 1 TABLET BY MOUTH AT BEDTIME 90 tablet 1  . azelastine (ASTELIN) 0.1 % nasal spray Place 1 spray into both nostrils 2 (two) times daily. Use in each nostril as directed 30 mL 1  . benazepril (LOTENSIN) 40 MG tablet TAKE 1 TABLET BY MOUTH DAILY 90 tablet 1  . budesonide-formoterol (SYMBICORT) 160-4.5 MCG/ACT inhaler Inhale 2 puffs into the lungs 2 (two) times daily. 1 Inhaler 6  . busPIRone (BUSPAR) 10 MG tablet Take 10 mg by mouth 2 (two) times daily.     . calcium citrate-vitamin D (CITRACAL+D) 315-200 MG-UNIT tablet Take 1 tablet by mouth daily.    [name] Sodium 30-100 MG CAPS Take 1 capsule by mouth daily.     . Cholecalciferol (VITAMIN D3) 1000 UNITS CAPS Take by mouth.    . DULoxetine (CYMBALTA) 60 MG capsule TAKE 1 CAPSULE BY MOUTH EVERY DAY. 30 capsule 3  . furosemide (LASIX) 20 MG tablet TAKE 1 TABLET BY MOUTH DAILY AS NEEDED 90 tablet 0  . gabapentin (NEURONTIN) 300 MG capsule Take 2 capsules (600 mg total) by mouth 3 (three) times daily. 540 capsule 2  . hydrochlorothiazide  (MICROZIDE) 12.5 MG capsule Take 12.5 mg by mouth daily.    Marland Kitchen HYDROcodone-acetaminophen (NORCO) 10-325 MG tablet Take 1 tablet by mouth 3 (three) times daily. 10 tablet 0  . magnesium oxide (MAG-OX) 400 MG tablet Take 1 tablet (400 mg total) by mouth daily. 30 tablet 1  . metoprolol succinate (TOPROL-XL) 100 MG 24 hr tablet Take 100 mg by mouth 2 (two) times daily.    Marland Kitchen morphine (MS CONTIN) 60 MG 12 hr tablet Take 1 tablet (60 mg total) by mouth 2 (two) times daily. 10 tablet 0  . Naldemedine Tosylate (SYMPROIC) 0.2 MG TABS Take 0.2 mg by mouth daily.    Marland Kitchen nystatin cream (MYCOSTATIN) Apply 1 application topically daily as needed. 30 g 0  . polyethylene glycol powder (GLYCOLAX/MIRALAX) 17 GM/SCOOP powder MIX 17 GRAMS AS MARKED ON BOTTLE TOP IN 8 OUNCES OF WATER AND DRINK ONCE A DAY AS DIRECTED. 527 g 0  . Probiotic Product (ALIGN PO) Take by mouth daily.    . RABEprazole (ACIPHEX) 20 MG tablet Take 1 tablet (20 mg total) by mouth 2 (two) times daily before a meal. 60 tablet 2  . senna (SENOKOT) 8.6 MG TABS tablet Take 1 tablet by mouth 2 (two) times daily.    . Simethicone (GAS-X PO) Take 2 tablets by mouth 2 (two) times daily.    . sodium chloride (OCEAN) 0.65 % nasal spray Place 1 spray into the nose as needed.    Marland Kitchen SYNTHROID 100 MCG tablet TAKE 1 TABLET BY MOUTH DAILY 90 tablet 3  . traZODone (DESYREL) 50 MG tablet Take 25-50 mg by mouth at bedtime as needed.    . triamcinolone cream (KENALOG) 0.1 %      No facility-administered medications prior  to visit.    Allergies  Allergen Reactions  . Lyrica [Pregabalin] Swelling  . Omnicef [Cefdinir] Diarrhea and Nausea And Vomiting  . Atarax [Hydroxyzine]     jittery  . Dicyclomine Other (See Comments)    Abdominal bloating   . Hydroxyzine Hcl     jittery  . Levaquin [Levofloxacin] Swelling  . Nucynta Er [Tapentadol Hcl Er] Other (See Comments)    Severe constipation   . Oxybutynin Other (See Comments)    Blurred vision  . Zoloft  [Sertraline Hcl]     Severe headache  . Biaxin [Clarithromycin] Other (See Comments) and Rash    Pt does not remember Pt does not remember  . Sertraline Nausea And Vomiting    Severe headache Severe headache Other reaction(s): Headache Severe headache  . Sulfa Antibiotics Rash    Pt does not remember  . Sulfasalazine Rash    Pt does not remember  . Tape Rash    [place] - redness  . Tapentadol Other (See Comments) and Rash    _0    ROS: See pertinent positives and negatives in history of present illness.   Objective:    Physical Exam  Constitutional: She is oriented to person, place, and time. She appears well-developed and well-nourished.  HENT:  Head: Normocephalic and atraumatic.  Cardiovascular: Normal rate and regular rhythm.  Pulmonary/Chest: Effort normal and breath sounds normal. No respiratory distress. She has no wheezes. She has no rales.  Abdominal: Soft. She exhibits no distension. There is no abdominal tenderness.  Musculoskeletal:     Comments: Examined in a chair- unable to climb onto stepstool to get onto exam table. Right hip and  knee chronic pain.   Neurological: She is alert and oriented to person, place, and time.  Skin: Skin is warm and dry.  Psychiatric: She has a normal mood and affect. Her behavior is normal. Judgment and thought content normal.  Mood is normal and sad when talking about the loss of her husband. They both had Covid virus and he did not survive it. She brings in a list of her concerns and is very organized.     BP (!) 146/80   Pulse 67   Temp 97.9 F (36.6 C) (Oral)   Ht _1  (1.575 m)   Wt 189 lb (85.7 kg)   SpO2 94%   BMI 34.57 kg/m  Wt Readings from Last 3 Encounters:  12/17/19 189 lb (85.7 kg)  12/10/19 183 lb (83 kg)  11/12/19 177 lb (80.3 kg)    Health Maintenance Due  Topic Date Due  . MAMMOGRAM  11/13/2019     There are no preventive care reminders to display for this patient.  Lab Results  Component Value Date   TSH 0.42 06/30/2019   Lab Results  Component Value Date   WBC 14.4 11/07/2019   HGB 10.4 (A) 11/07/2019   HCT 31 (A) 11/07/2019   MCV 95 10/29/2019   PLT 325 11/07/2019   Lab Results  Component Value Date   NA 140 10/29/2019   K 4.5 11/07/2019   CO2 30 (H) 10/29/2019   GLUCOSE 89 10/29/2019   BUN 14 10/29/2019   CREATININE 0.84 10/29/2019   BILITOT <0.2 10/29/2019   ALKPHOS 100 10/29/2019   AST 15 10/29/2019   ALT 6 10/29/2019   PROT 6.0 10/29/2019   ALBUMIN 3.3 (L) 10/29/2019   CALCIUM 9.8 10/29/2019   ANIONGAP 9 07/05/2019   GFR  47.56 (L) 06/30/2019   Lab Results  Component Value Date   CHOL 125 06/30/2019   Lab Results  Component Value Date   HDL 51.20 06/30/2019   Lab Results  Component Value Date   LDLCALC 59 06/30/2019   Lab Results  Component Value Date   TRIG 73.0 06/30/2019   Lab Results  Component Value Date   CHOLHDL 2 06/30/2019   Lab Results  Component Value Date   HGBA1C 6.0 10/19/2014      Assessment & Plan:   Problem List Items Addressed This Visit      Digestive   Dysphagia - Primary   Relevant Orders   Ambulatory referral to Gastroenterology   GERD (gastroesophageal reflux disease)   Relevant Orders   Ambulatory referral to Gastroenterology     Other   Hoarseness   Abdominal pain   Relevant Orders   Ambulatory referral to Gastroenterology     We reviewed your upper endoscopy completed by Dr. Gustavo Lah last year.  You do have esophageal spasm   and gastritis.  Stay on your Aciphex. Avoid ibuprofen, Aleve, Advil.   Try not to drink iced cold beverages. Chew well, eat slowly, and remain upright after eating.  I would also advise you to stop taking all of your pills at one time- a handful-  in the morning. Also, try to change your  breakfast to scrambled eggs, yogurt, and soften toast. Very warm coffee is OK.  Stay  upright after eating every meal.  You have had antibiotics with your Covid infection and the possibility of esophageal candidiasis is discussed today.  That would be treated with nystatin swallow.  We can consider this next week if the above suggestions do not help.  Await results of the chest CT as planned by her pulmonologist.  For your GERD/dysphagia and continued intermittent abdominal pain  I will refer you to Dr. Thornton Park in [place]. You have requested a second opinion.  You should be notified about that appointment within a week.  Let us know if you have not heard back.  Follow-up office visit with Dr. Nicki Reaper in 3 weeks for a follow up of hip and knee pain.   No orders of the defined types were placed in this encounter.  This visit occurred during the SARS-CoV-2 public health emergency.  Safety protocols were in place, including screening questions prior to the visit, additional usage of staff PPE, and extensive cleaning of exam room while observing appropriate contact time as indicated for disinfecting solutions.   A total of 40 minutes was spent with patient more than half of which was spent in counseling patient on the above mentioned issues , reviewing and explaining recent labs and imaging studies done, and coordination of care.  Follow-up: No follow-ups on file.    Denice Paradise, NP

## 2019-12-17 NOTE — Patient Instructions (Addendum)
It was nice to see you again today.  We reviewed your upper endoscopy completed by Dr. Gustavo Lah last year.  You do have esophageal spasm   and gastritis.  Stay on your Aciphex. Avoid ibuprofen, Aleve, Advil.   Try not to drink iced cold beverages. Chew well, eat slowly, and remain upright after eating.  I would also advise you to stop taking all of your pills at one time- a handful-  in the morning. Also, try to change your  breakfast to scrambled eggs, yogurt, and soften toast. Very warm coffee is OK.  Stay upright after eating every meal.  You have had antibiotics with your Covid infection and the possibility of esophageal candidiasis is discussed today.  That would be treated with nystatin swallow.  We can consider this next week if the above suggestions do not help.  Await results of the chest CT as planned by her pulmonologist.  For your continued intermittent abdominal pain, and request for second opinion I will refer you to Dr. Thornton Park in [place].  You should be notified about that appointment within a week.  Let us know if you have not heard back.  Follow-up office visit with Dr. Nicki Reaper for a follow up of hip and knee pain.  Dysphagia  Dysphagia is trouble swallowing. This condition occurs when solids and liquids stick in a person's throat on the way down to the stomach, or when food takes longer to get to the stomach than usual. You may have problems swallowing food, liquids, or both. You may also have pain while trying to swallow. It may take you more time and effort to swallow something. What are the causes? This condition may be caused by:  Muscle problems. They may make it difficult for you to move food and liquids through the esophagus, which is the tube that connects your mouth to your stomach.  Blockages. You may have ulcers, scar tissue, or inflammation that blocks the normal passage of food and liquids. Causes of these problems include: ? Acid reflux from your  stomach into your esophagus (gastroesophageal reflux). ? Infections. ? Radiation treatment for cancer. ? Medicines taken without enough fluids to wash them down into your stomach.  Stroke. This can affect the nerves and make it difficult to swallow.  Nerve problems. These prevent signals from being sent to the muscles of your esophagus to squeeze (contract) and move what you swallow down to your stomach.  Globus pharyngeus. This is a common problem that involves a feeling like something is stuck in your throat or a sense of trouble with swallowing, even though nothing is wrong with the swallowing passages.  Certain conditions, such as cerebral palsy or Parkinson's disease. What are the signs or symptoms? Common symptoms of this condition include:  A feeling that solids or liquids are stuck in your throat on the way down to the stomach.  Pain while swallowing.  Coughing or gagging while trying to swallow. Other symptoms include:  Food moving back from your stomach to your mouth (regurgitation).  Noises coming from your throat.  Chest discomfort with swallowing.  A feeling of fullness when swallowing.  Drooling, especially when the throat is blocked.  Heartburn. How is this diagnosed? This condition may be diagnosed by:  Barium X-ray. In this test, you will swallow a white liquid that sticks to the inside of your esophagus. X-ray images are then taken.  Endoscopy. In this test, a flexible telescope is inserted down your throat to look at your  esophagus and your stomach.  CT scans and an MRI. How is this treated? Treatment for dysphagia depends on the cause of this condition, such as:  If the dysphagia is caused by acid reflux or infection, medicines may be used. They may include antibiotics and heartburn medicines.  If the dysphagia is caused by problems with the muscles, swallowing therapy may be used to help you strengthen your swallowing muscles. You may have to do  specific exercises to strengthen the muscles or stretch them.  If the dysphagia is caused by a blockage or mass, procedures to remove the blockage may be done. You may need surgery and a feeding tube. You may need to make diet changes. Ask your health care provider for specific instructions. Follow these instructions at home: Medicines  Take over-the-counter and prescription medicines only as told by your health care provider.  If you were prescribed an antibiotic medicine, take it as told by your health care provider. Do not stop taking the antibiotic even if you start to feel better. Eating and drinking   Follow any diet changes as told by your health care provider.  Work with a diet and nutrition specialist (dietitian) to create an eating plan that will help you get the nutrients you need in order to stay healthy.  Eat soft foods that are easier to swallow.  Cut your food into small pieces and eat slowly. Take small bites.  Eat and drink only when you are sitting upright.  Do not drink alcohol or caffeine. If you need help quitting, ask your health care provider. General instructions  Check your weight every day to make sure you are not losing weight.  Do not use any products that contain nicotine or tobacco, such as cigarettes, e-cigarettes, and chewing tobacco. If you need help quitting, ask your health care provider.  Keep all follow-up visits as told by your health care provider. This is important. Contact a health care provider if you:  Lose weight because you cannot swallow.  Cough when you drink liquids.  Cough up partially digested food. Get help right away if you:  Cannot swallow your saliva.  Have shortness of breath, a fever, or both.  Have a hoarse voice and also have trouble swallowing. Summary  Dysphagia is trouble swallowing. This condition occurs when solids and liquids stick in a person's throat on the way down to the stomach. You may cough or gag  while trying to swallow.  Dysphagia has many possible causes.  Treatment for dysphagia depends on the cause of the condition.  Keep all follow-up visits as told by your health care provider. This is important. This information is not intended to replace advice given to you by your health care provider. Make sure you discuss any questions you have with your health care provider. Document Revised: 01/13/2019 Document Reviewed: 01/13/2019 Elsevier Patient Education  [place].  Gastroesophageal Reflux Disease, Adult Gastroesophageal reflux (GER) happens when acid from the stomach flows up into the tube that connects the mouth and the stomach (esophagus). Normally, food travels down the esophagus and stays in the stomach to be digested. However, when a person has GER, food and stomach acid sometimes move back up into the esophagus. If this becomes a more serious problem, the person may be diagnosed with a disease called gastroesophageal reflux disease (GERD). GERD occurs when the reflux:  Happens often.  Causes frequent or severe symptoms.  Causes problems such as damage to the esophagus. When stomach acid comes  in contact with the esophagus, the acid may cause soreness (inflammation) in the esophagus. Over time, GERD may create small holes (ulcers) in the lining of the esophagus. What are the causes? This condition is caused by a problem with the muscle between the esophagus and the stomach (lower esophageal sphincter, or LES). Normally, the LES muscle closes after food passes through the esophagus to the stomach. When the LES is weakened or abnormal, it does not close properly, and that allows food and stomach acid to go back up into the esophagus. The LES can be weakened by certain dietary substances, medicines, and medical conditions, including:  Tobacco use.  Pregnancy.  Having a hiatal hernia.  Alcohol use.  Certain foods and beverages, such as coffee, chocolate, onions,  and peppermint. What increases the risk? You are more likely to develop this condition if you:  Have an increased body weight.  Have a connective tissue disorder.  Use NSAID medicines. What are the signs or symptoms? Symptoms of this condition include:  Heartburn.  Difficult or painful swallowing.  The feeling of having a lump in the throat.  Abitter taste in the mouth.  Bad breath.  Having a large amount of saliva.  Having an upset or bloated stomach.  Belching.  Chest pain. Different conditions can cause chest pain. Make sure you see your health care provider if you experience chest pain.  Shortness of breath or wheezing.  Ongoing (chronic) cough or a night-time cough.  Wearing away of tooth enamel.  Weight loss. How is this diagnosed? Your health care provider will take a medical history and perform a physical exam. To determine if you have mild or severe GERD, your health care provider may also monitor how you respond to treatment. You may also have tests, including:  A test to examine your stomach and esophagus with a small camera (endoscopy).  A test thatmeasures the acidity level in your esophagus.  A test thatmeasures how much pressure is on your esophagus.  A barium swallow or modified barium swallow test to show the shape, size, and functioning of your esophagus. How is this treated? The goal of treatment is to help relieve your symptoms and to prevent complications. Treatment for this condition may vary depending on how severe your symptoms are. Your health care provider may recommend:  Changes to your diet.  Medicine.  Surgery. Follow these instructions at home: Eating and drinking   Follow a diet as recommended by your health care provider. This may involve avoiding foods and drinks such as: ? Coffee and tea (with or without caffeine). ? Drinks that containalcohol. ? Energy drinks and sports drinks. ? Carbonated drinks or  sodas. ? Chocolate and cocoa. ? Peppermint and mint flavorings. ? Garlic and onions. ? Horseradish. ? Spicy and acidic foods, including peppers, chili powder, curry powder, vinegar, hot sauces, and barbecue sauce. ? Citrus fruit juices and citrus fruits, such as oranges, lemons, and limes. ? Tomato-based foods, such as red sauce, chili, salsa, and pizza with red sauce. ? Fried and fatty foods, such as donuts, french fries, potato chips, and high-fat dressings. ? High-fat meats, such as hot dogs and fatty cuts of red and white meats, such as rib eye steak, sausage, ham, and bacon. ? High-fat dairy items, such as whole milk, butter, and cream cheese.  Eat small, frequent meals instead of large meals.  Avoid drinking large amounts of liquid with your meals.  Avoid eating meals during the 2-3 hours before bedtime.  Avoid  lying down right after you eat.  Do not exercise right after you eat. Lifestyle   Do not use any products that contain nicotine or tobacco, such as cigarettes, e-cigarettes, and chewing tobacco. If you need help quitting, ask your health care provider.  Try to reduce your stress by using methods such as yoga or meditation. If you need help reducing stress, ask your health care provider.  If you are overweight, reduce your weight to an amount that is healthy for you. Ask your health care provider for guidance about a safe weight loss goal. General instructions  Pay attention to any changes in your symptoms.  Take over-the-counter and prescription medicines only as told by your health care provider. Do not take aspirin, ibuprofen, or other NSAID's unless your health care provider told you to do so.  Wear loose-fitting clothing. Do not wear anything tight around your waist that causes pressure on your abdomen.  Raise (elevate) the head of your bed about 6 inches (15 cm).  Avoid bending over if this makes your symptoms worse.  Keep all follow-up visits as told by  your health care provider. This is important. Contact a health care provider if:  You have: ? New symptoms. ? Unexplained weight loss. ? Difficulty swallowing or it hurts to swallow. ? Wheezing or a persistent cough. ? A hoarse voice.  Your symptoms do not improve with treatment. Get help right away if you:  Have pain in your arms, neck, jaw, teeth, or back.  Feel sweaty, dizzy, or light-headed.  Have chest pain or shortness of breath.  Vomit and your vomit looks like blood or coffee grounds.  Faint.  Have stool that is bloody or black.  Cannot swallow, drink, or eat. Summary  Gastroesophageal reflux happens when acid from the stomach flows up into the esophagus. GERD is a disease in which the reflux happens often, causes frequent or severe symptoms, or causes problems such as damage to the esophagus.  Treatment for this condition may vary depending on how severe your symptoms are. Your health care provider may recommend diet and lifestyle changes, medicine, or surgery.  Contact a health care provider if you have new or worsening symptoms.  Take over-the-counter and prescription medicines only as told by your health care provider. Do not take aspirin, ibuprofen, or other NSAIDs unless your health care provider told you to do so.  Keep all follow-up visits as told by your health care provider. This is important. This information is not intended to replace advice given to you by your health care provider. Make sure you discuss any questions you have with your health care provider. Document Revised: 02/25/2018 Document Reviewed: 02/25/2018 Elsevier Patient Education  [place].

## 2019-12-19 ENCOUNTER — Telehealth: Payer: Self-pay | Admitting: Nurse Practitioner

## 2019-12-19 ENCOUNTER — Encounter: Payer: Self-pay | Admitting: Nurse Practitioner

## 2019-12-19 NOTE — Telephone Encounter (Signed)
Please call for an update on her throat clearing and GERD/dysphagia symptoms after applying the recommendations we discussed on Friday.    Also, please arrange an OV f/up with Dr. Nicki Reaper for hip and knee chronic pain. Thank you.

## 2019-12-20 ENCOUNTER — Encounter: Payer: Self-pay | Admitting: Emergency Medicine

## 2019-12-20 ENCOUNTER — Telehealth: Payer: Self-pay | Admitting: Internal Medicine

## 2019-12-20 ENCOUNTER — Other Ambulatory Visit: Payer: Self-pay

## 2019-12-20 ENCOUNTER — Ambulatory Visit
Admission: EM | Admit: 2019-12-20 | Discharge: 2019-12-20 | Disposition: A | Discharge status: Home / Self Care | Payer: PPO | Attending: Urgent Care | Admitting: Urgent Care

## 2019-12-20 DIAGNOSIS — M79622 Pain in left upper arm: Secondary | ICD-10-CM

## 2019-12-20 DIAGNOSIS — T50Z95A Adverse effect of other vaccines and biological substances, initial encounter: Secondary | ICD-10-CM

## 2019-12-20 MED ORDER — DICLOFENAC SODIUM 3 % EX GEL: 1.0000 g | Freq: Two times a day (BID) | CUTANEOUS | 0 refills | Status: DC | PRN

## 2019-12-20 NOTE — Telephone Encounter (Signed)
Pt called in and said she got Covid vaccine last week and her arm is still swollen. She wants to know Dr. Bary Leriche opinion on what she should do. I asked her was there a number that she could call about reactions to covid vaccine and she said she will call them but it also told her to call her pcp.

## 2019-12-20 NOTE — ED Provider Notes (Signed)
Kimberly Roy   MRN: [id number] DOB: 08/31/1931  Subjective:   Kimberly Roy is a 86 y.o. female presenting for 3 to 4-day history of persistent left arm pain with redness and warmth.  Patient started having symptoms after she got her Covid vaccine on Thursday.  Denies fever, chest pain or changes in her shortness of breath.  She has had COVID-19 back in January and is still recovering.  States that her pulse oximetry ranges between 91% to 95%.  She also has a history of heart failure, OSA, pulmonary hypertension.  No other chronic lung disease.  Has used Tylenol for the pain.  She does have Voltaren at home but has not used this.  No current facility-administered medications for this encounter.  Current Outpatient Medications:  .  albuterol (VENTOLIN HFA) 108 (90 Base) MCG/ACT inhaler, INHALE 2 PUFFS INTO THE LUNGS EVERY 6 (SIX) HOURS AS NEEDED FOR WHEEZING OR SHORTNESS OF BREATH, Disp: 18 g, Rfl: 0 .  aluminum-magnesium hydroxide-simethicone (MAALOX) 585-277-82 MG/5ML SUSP, Take 15 mLs by mouth 4 (four) times daily -  before meals and at bedtime. , Disp: , Rfl:  .  amLODipine (NORVASC) 10 MG tablet, TAKE 1 TABLET BY MOUTH DAILY, Disp: 90 tablet, Rfl: 1 .  atorvastatin (LIPITOR) 20 MG tablet, TAKE 1 TABLET BY MOUTH AT BEDTIME, Disp: 90 tablet, Rfl: 1 .  azelastine (ASTELIN) 0.1 % nasal spray, Place 1 spray into both nostrils 2 (two) times daily. Use in each nostril as directed, Disp: 30 mL, Rfl: 1 .  benazepril (LOTENSIN) 40 MG tablet, TAKE 1 TABLET BY MOUTH DAILY, Disp: 90 tablet, Rfl: 1 .  budesonide-formoterol (SYMBICORT) 160-4.5 MCG/ACT inhaler, Inhale 2 puffs into the lungs 2 (two) times daily., Disp: 1 Inhaler, Rfl: 6 .  busPIRone (BUSPAR) 10 MG tablet, Take 10 mg by mouth 2 (two) times daily. , Disp: , Rfl:  .  calcium citrate-vitamin D (CITRACAL+D) 315-200 MG-UNIT tablet, Take 1 tablet by mouth daily., Disp: , Rfl:  .  Casanthranol-Docusate Sodium 30-100 MG CAPS, Take 1  capsule by mouth daily. , Disp: , Rfl:  .  Cholecalciferol (VITAMIN D3) 1000 UNITS CAPS, Take by mouth., Disp: , Rfl:  .  DULoxetine (CYMBALTA) 60 MG capsule, TAKE 1 CAPSULE BY MOUTH EVERY DAY., Disp: 30 capsule, Rfl: 3 .  furosemide (LASIX) 20 MG tablet, TAKE 1 TABLET BY MOUTH DAILY AS NEEDED, Disp: 90 tablet, Rfl: 0 .  gabapentin (NEURONTIN) 300 MG capsule, Take 2 capsules (600 mg total) by mouth 3 (three) times daily., Disp: 540 capsule, Rfl: 2 .  hydrochlorothiazide (MICROZIDE) 12.5 MG capsule, Take 12.5 mg by mouth daily., Disp: , Rfl:  .  HYDROcodone-acetaminophen (NORCO) 10-325 MG tablet, Take 1 tablet by mouth 3 (three) times daily., Disp: 10 tablet, Rfl: 0 .  magnesium oxide (MAG-OX) 400 MG tablet, Take 1 tablet (400 mg total) by mouth daily., Disp: 30 tablet, Rfl: 1 .  metoprolol succinate (TOPROL-XL) 100 MG 24 hr tablet, Take 100 mg by mouth 2 (two) times daily., Disp: , Rfl:  .  morphine (MS CONTIN) 60 MG 12 hr tablet, Take 1 tablet (60 mg total) by mouth 2 (two) times daily., Disp: 10 tablet, Rfl: 0 .  Naldemedine Tosylate (SYMPROIC) 0.2 MG TABS, Take 0.2 mg by mouth daily., Disp: , Rfl:  .  nystatin cream (MYCOSTATIN), Apply 1 application topically daily as needed., Disp: 30 g, Rfl: 0 .  polyethylene glycol powder (GLYCOLAX/MIRALAX) 17 GM/SCOOP powder, MIX 17 GRAMS AS MARKED ON BOTTLE  TOP IN 8 OUNCES OF WATER AND DRINK ONCE A DAY AS DIRECTED., Disp: 527 g, Rfl: 0 .  Probiotic Product (ALIGN PO), Take by mouth daily., Disp: , Rfl:  .  RABEprazole (ACIPHEX) 20 MG tablet, Take 1 tablet (20 mg total) by mouth 2 (two) times daily before a meal., Disp: 60 tablet, Rfl: 2 .  senna (SENOKOT) 8.6 MG TABS tablet, Take 1 tablet by mouth 2 (two) times daily., Disp: , Rfl:  .  Simethicone (GAS-X PO), Take 2 tablets by mouth 2 (two) times daily., Disp: , Rfl:  .  sodium chloride (OCEAN) 0.65 % nasal spray, Place 1 spray into the nose as needed., Disp: , Rfl:  .  SYNTHROID 100 MCG tablet, TAKE 1  TABLET BY MOUTH DAILY, Disp: 90 tablet, Rfl: 3 .  traZODone (DESYREL) 50 MG tablet, Take 25-50 mg by mouth at bedtime as needed., Disp: , Rfl:  .  triamcinolone cream (KENALOG) 0.1 %, , Disp: , Rfl:    Allergies  Allergen Reactions  . Lyrica [Pregabalin] Swelling  . Omnicef [Cefdinir] Diarrhea and Nausea And Vomiting  . Atarax [Hydroxyzine]     jittery  . Dicyclomine Other (See Comments)    Abdominal bloating   . Hydroxyzine Hcl     jittery  . Levaquin [Levofloxacin] Swelling  . Nucynta Er [Tapentadol Hcl Er] Other (See Comments)    Severe constipation   . Oxybutynin Other (See Comments)    Blurred vision  . Zoloft [Sertraline Hcl]     Severe headache  . Biaxin [Clarithromycin] Other (See Comments) and Rash    Pt does not remember Pt does not remember  . Sertraline Nausea And Vomiting    Severe headache Severe headache Other reaction(s): Headache Severe headache  . Sulfa Antibiotics Rash    Pt does not remember  . Sulfasalazine Rash    Pt does not remember  . Tape Rash    [place] - redness  . Tapentadol Other (See Comments) and Rash    _0     Past Medical History:  Diagnosis Date  . Anemia   . Anxiety   . Chest pain   . CHF (congestive heart failure) ([place])   . Constipation   . DDD (degenerative disc disease), cervical   . Depression   . DVT (deep venous thrombosis) ([place])   . Dysphonia   . Dyspnea   . Fatty liver   . Fatty liver   . Headache   . Hyperlipidemia   . Hyperpiesia   . Hypertension   . Hypothyroidism   . Interstitial cystitis   . Left ventricular dysfunction   . Lymphedema   . Nephrolithiasis   . Obstructive sleep apnea   . Osteoarthritis    knees/cervical and lumbar spine  . Pulmonary hypertension ([place])   . Pulmonary nodules    followed by Dr Raul Del  . Pure hypercholesterolemia   . Renal cyst    right     Past Surgical History:    Procedure Laterality Date  . ABDOMINAL HYSTERECTOMY     ovaries left in place  . APPENDECTOMY    . Back Surgeries    . BACK SURGERY    . BREAST REDUCTION SURGERY     3/99  . CARDIAC CATHETERIZATION    . cataracts Bilateral   . CERVICAL SPINE SURGERY    . ESOPHAGEAL MANOMETRY N/A 08/02/2015   Procedure: ESOPHAGEAL MANOMETRY (EM);  Surgeon: Josefine Class, MD;  Location: [facility] ENDOSCOPY;  Service: Endoscopy;  Laterality: N/A;  . ESOPHAGOGASTRODUODENOSCOPY N/A 02/27/2015   Procedure: ESOPHAGOGASTRODUODENOSCOPY (EGD);  Surgeon: Hulen Luster, MD;  Location: [facility] ENDOSCOPY;  Service: Gastroenterology;  Laterality: N/A;  . ESOPHAGOGASTRODUODENOSCOPY (EGD) WITH PROPOFOL N/A 10/30/2018   Procedure: ESOPHAGOGASTRODUODENOSCOPY (EGD) WITH PROPOFOL;  Surgeon: Lollie Sails, MD;  Location: [facility] ENDOSCOPY;  Service: Endoscopy;  Laterality: N/A;  . EXCISIONAL HEMORRHOIDECTOMY    . EYE SURGERY    . FRACTURE SURGERY    . HEMORRHOID SURGERY    . HIP SURGERY  2013   Right hip surgery  . JOINT REPLACEMENT    . KNEE ARTHROSCOPY     left and right  . ORIF FEMUR FRACTURE Right 08/07/2018   Procedure: OPEN REDUCTION INTERNAL FIXATION (ORIF) DISTAL FEMUR FRACTURE;  Surgeon: Hessie Knows, MD;  Location: [facility] ORS;  Service: Orthopedics;  Laterality: Right;  . REDUCTION MAMMAPLASTY Bilateral YRS AGO  . REPLACEMENT TOTAL KNEE Bilateral   . rotator cuff surgery     blilateral  . TONSILECTOMY/ADENOIDECTOMY WITH MYRINGOTOMY    . VISCERAL ARTERY INTERVENTION N/A 02/25/2019   Procedure: VISCERAL ARTERY INTERVENTION;  Surgeon: Algernon Huxley, MD;  Location: [facility];  Service: Cardiovascular;  Laterality: N/A;    Family History  Problem Relation Age of Onset  . Heart disease Mother   . Stroke Mother   . Hypertension Mother   . Heart disease Father        myocardial infarction age 39  . Breast cancer Neg Hx     Social History   Tobacco Use  . Smoking status: Never Smoker  . Smokeless  tobacco: Never Used  Substance Use Topics  . Alcohol use: No    Alcohol/week: 0.0 standard drinks  . Drug use: Never    ROS   Objective:   Vitals: BP (!) 149/55   Pulse 60   Temp 98.9 F (37.2 C) (Oral)   Resp 18   Wt 189 lb (85.7 kg)   SpO2 95%   BMI 34.57 kg/m   Physical Exam Constitutional:      General: She is not in acute distress.    Appearance: Normal appearance. She is well-developed. She is ill-appearing (Chronically). She is not toxic-appearing or diaphoretic.  HENT:     Head: Normocephalic and atraumatic.     Nose: Nose normal.     Mouth/Throat:     Mouth: Mucous membranes are moist.  Eyes:     Extraocular Movements: Extraocular movements intact.     Pupils: Pupils are equal, round, and reactive to light.  Cardiovascular:     Rate and Rhythm: Normal rate and regular rhythm.     Pulses: Normal pulses.     Heart sounds: Normal heart sounds. No murmur. No friction rub. No gallop.   Pulmonary:     Effort: Pulmonary effort is normal. No respiratory distress.     Breath sounds: Normal breath sounds. No stridor. No wheezing, rhonchi or rales.  Musculoskeletal:       Arms:  Skin:    General: Skin is warm and dry.     Findings: No rash.  Neurological:     Mental Status: She is alert and oriented to person, place, and time.  Psychiatric:        Mood and Affect: Mood normal.        Behavior: Behavior normal.        Thought Content: Thought content normal.        Judgment: Judgment  normal.         Assessment and Plan :   PDMP not reviewed this encounter.  1. Left upper arm pain   2. Vaccine reaction, initial encounter     Low suspicion for cellulitis.  Counseled that this is a local reaction and will have patient start conservative management including maintaining Tylenol, icing and diclofenac gel.  Despite her allergy to sulfasalazine patient has been able to take Voltaren in the past.  Pulse oximetry did range between 91 to 95% in office but is  not new for the patient.  Lung sounds are clear otherwise. Counseled patient on potential for adverse effects with medications prescribed/recommended today, ER and return-to-clinic precautions discussed, patient verbalized understanding.    Jaynee Eagles, [place] 12/21/19 [phone]

## 2019-12-20 NOTE — Discharge Instructions (Signed)
If the redness worsens, the arm becomes more painful or starts to have drainage of pus or bleeding then please return for recheck.  Otherwise, continue to use Tylenol at a dose of 500 mg-650 mg once every 6 hours as needed for the pain.  You can use icing over the area with a thin barrier in between that and the arm; apply for 20 minutes once every 2 hours.

## 2019-12-20 NOTE — ED Triage Notes (Signed)
Patient in office with cousin (Lynne)c/o left deltoid swelling,redness and heat  after covid shot given on Thursday. Patient was instructed to come to Urgent care for further eval. Per provider   Denies: n/v, fever

## 2019-12-20 NOTE — Telephone Encounter (Signed)
Patient stated the spot at the injection site is red and swollen bigger than it was last week. Hurts to touch and very warm. The vaccination site did say she may react this way but they stated sx are worsening. No fever, chills, arm px, and she is taking tylenol for sx. Due to not having availability in office I instructed her to go to an UC.  Patient and patients daughter stated they will go.

## 2019-12-20 NOTE — Telephone Encounter (Signed)
Patient stated the sx are not improving they are the same as on Friday. She does not want to try anything at the moment.

## 2019-12-20 NOTE — Telephone Encounter (Signed)
Please clarify if she is having local swelling or is whole arm swollen.  Any other problems or symptoms.  Any redness.  If acute issues, need evaluation.

## 2019-12-22 DIAGNOSIS — E039 Hypothyroidism, unspecified: Secondary | ICD-10-CM | POA: Diagnosis not present

## 2019-12-22 DIAGNOSIS — M810 Age-related osteoporosis without current pathological fracture: Secondary | ICD-10-CM | POA: Diagnosis not present

## 2019-12-22 DIAGNOSIS — F439 Reaction to severe stress, unspecified: Secondary | ICD-10-CM | POA: Diagnosis not present

## 2019-12-22 DIAGNOSIS — K219 Gastro-esophageal reflux disease without esophagitis: Secondary | ICD-10-CM | POA: Diagnosis not present

## 2019-12-22 DIAGNOSIS — D649 Anemia, unspecified: Secondary | ICD-10-CM | POA: Diagnosis not present

## 2019-12-22 DIAGNOSIS — Z9181 History of falling: Secondary | ICD-10-CM | POA: Diagnosis not present

## 2019-12-22 DIAGNOSIS — I272 Pulmonary hypertension, unspecified: Secondary | ICD-10-CM | POA: Diagnosis not present

## 2019-12-22 DIAGNOSIS — U071 COVID-19: Secondary | ICD-10-CM | POA: Diagnosis not present

## 2019-12-22 DIAGNOSIS — I700 Atherosclerosis of aorta: Secondary | ICD-10-CM | POA: Diagnosis not present

## 2019-12-22 DIAGNOSIS — I509 Heart failure, unspecified: Secondary | ICD-10-CM | POA: Diagnosis not present

## 2019-12-22 DIAGNOSIS — I890 Lymphedema, not elsewhere classified: Secondary | ICD-10-CM | POA: Diagnosis not present

## 2019-12-22 DIAGNOSIS — I872 Venous insufficiency (chronic) (peripheral): Secondary | ICD-10-CM | POA: Diagnosis not present

## 2019-12-22 DIAGNOSIS — I110 Hypertensive heart disease with heart failure: Secondary | ICD-10-CM | POA: Diagnosis not present

## 2019-12-22 DIAGNOSIS — Z96653 Presence of artificial knee joint, bilateral: Secondary | ICD-10-CM | POA: Diagnosis not present

## 2019-12-22 DIAGNOSIS — E7800 Pure hypercholesterolemia, unspecified: Secondary | ICD-10-CM | POA: Diagnosis not present

## 2019-12-22 NOTE — Telephone Encounter (Signed)
I called her on the telephone. She is no longer taking a handful of pills in the morning at one time. She is  separating out 3 pills  at a time.  She is not putting ice cubes in her drinks.  She is eating slowly, taking small bites, and chewing well.  She says she is only about 10% better.  Patient discloses that her swallowing issues have been a chronic problem for many years and not since she had Covid in January.  She declines trial of nystatin at this time.  She is awaiting a chest CT ordered next week.  I have placed a GI referral.

## 2019-12-23 ENCOUNTER — Ambulatory Visit: Payer: PPO

## 2019-12-23 DIAGNOSIS — F439 Reaction to severe stress, unspecified: Secondary | ICD-10-CM | POA: Diagnosis not present

## 2019-12-23 DIAGNOSIS — U071 COVID-19: Secondary | ICD-10-CM | POA: Diagnosis not present

## 2019-12-23 DIAGNOSIS — K219 Gastro-esophageal reflux disease without esophagitis: Secondary | ICD-10-CM | POA: Diagnosis not present

## 2019-12-23 DIAGNOSIS — I890 Lymphedema, not elsewhere classified: Secondary | ICD-10-CM | POA: Diagnosis not present

## 2019-12-23 DIAGNOSIS — I272 Pulmonary hypertension, unspecified: Secondary | ICD-10-CM | POA: Diagnosis not present

## 2019-12-23 DIAGNOSIS — Z9181 History of falling: Secondary | ICD-10-CM | POA: Diagnosis not present

## 2019-12-23 DIAGNOSIS — M810 Age-related osteoporosis without current pathological fracture: Secondary | ICD-10-CM | POA: Diagnosis not present

## 2019-12-23 DIAGNOSIS — D649 Anemia, unspecified: Secondary | ICD-10-CM | POA: Diagnosis not present

## 2019-12-23 DIAGNOSIS — N390 Urinary tract infection, site not specified: Secondary | ICD-10-CM | POA: Diagnosis not present

## 2019-12-23 DIAGNOSIS — I509 Heart failure, unspecified: Secondary | ICD-10-CM | POA: Diagnosis not present

## 2019-12-23 DIAGNOSIS — I872 Venous insufficiency (chronic) (peripheral): Secondary | ICD-10-CM | POA: Diagnosis not present

## 2019-12-23 DIAGNOSIS — N3010 Interstitial cystitis (chronic) without hematuria: Secondary | ICD-10-CM | POA: Diagnosis not present

## 2019-12-23 DIAGNOSIS — I700 Atherosclerosis of aorta: Secondary | ICD-10-CM | POA: Diagnosis not present

## 2019-12-23 DIAGNOSIS — I110 Hypertensive heart disease with heart failure: Secondary | ICD-10-CM | POA: Diagnosis not present

## 2019-12-23 DIAGNOSIS — E7800 Pure hypercholesterolemia, unspecified: Secondary | ICD-10-CM | POA: Diagnosis not present

## 2019-12-23 DIAGNOSIS — Z96653 Presence of artificial knee joint, bilateral: Secondary | ICD-10-CM | POA: Diagnosis not present

## 2019-12-23 DIAGNOSIS — E039 Hypothyroidism, unspecified: Secondary | ICD-10-CM | POA: Diagnosis not present

## 2019-12-24 ENCOUNTER — Telehealth: Payer: Self-pay | Admitting: Internal Medicine

## 2019-12-24 ENCOUNTER — Other Ambulatory Visit: Payer: Self-pay

## 2019-12-24 MED ORDER — NYSTATIN 100000 UNIT/GM EX POWD: CUTANEOUS | 1 refills | Status: DC

## 2019-12-24 NOTE — Telephone Encounter (Signed)
Pt said that [facility] sent [place] a request to refill nystatin powder not cream. She said she has plenty of the cream.

## 2019-12-24 NOTE — Telephone Encounter (Signed)
Medication refilled

## 2019-12-27 DIAGNOSIS — F439 Reaction to severe stress, unspecified: Secondary | ICD-10-CM | POA: Diagnosis not present

## 2019-12-27 DIAGNOSIS — U071 COVID-19: Secondary | ICD-10-CM | POA: Diagnosis not present

## 2019-12-27 DIAGNOSIS — E7800 Pure hypercholesterolemia, unspecified: Secondary | ICD-10-CM | POA: Diagnosis not present

## 2019-12-27 DIAGNOSIS — K219 Gastro-esophageal reflux disease without esophagitis: Secondary | ICD-10-CM | POA: Diagnosis not present

## 2019-12-27 DIAGNOSIS — I272 Pulmonary hypertension, unspecified: Secondary | ICD-10-CM | POA: Diagnosis not present

## 2019-12-27 DIAGNOSIS — I509 Heart failure, unspecified: Secondary | ICD-10-CM | POA: Diagnosis not present

## 2019-12-27 DIAGNOSIS — I110 Hypertensive heart disease with heart failure: Secondary | ICD-10-CM | POA: Diagnosis not present

## 2019-12-27 DIAGNOSIS — E039 Hypothyroidism, unspecified: Secondary | ICD-10-CM | POA: Diagnosis not present

## 2019-12-27 DIAGNOSIS — Z96653 Presence of artificial knee joint, bilateral: Secondary | ICD-10-CM | POA: Diagnosis not present

## 2019-12-27 DIAGNOSIS — I700 Atherosclerosis of aorta: Secondary | ICD-10-CM | POA: Diagnosis not present

## 2019-12-27 DIAGNOSIS — Z9181 History of falling: Secondary | ICD-10-CM | POA: Diagnosis not present

## 2019-12-27 DIAGNOSIS — I872 Venous insufficiency (chronic) (peripheral): Secondary | ICD-10-CM | POA: Diagnosis not present

## 2019-12-27 DIAGNOSIS — D649 Anemia, unspecified: Secondary | ICD-10-CM | POA: Diagnosis not present

## 2019-12-27 DIAGNOSIS — I890 Lymphedema, not elsewhere classified: Secondary | ICD-10-CM | POA: Diagnosis not present

## 2019-12-27 DIAGNOSIS — M810 Age-related osteoporosis without current pathological fracture: Secondary | ICD-10-CM | POA: Diagnosis not present

## 2019-12-28 DIAGNOSIS — N3010 Interstitial cystitis (chronic) without hematuria: Secondary | ICD-10-CM | POA: Diagnosis not present

## 2019-12-29 ENCOUNTER — Ambulatory Visit
Admission: RE | Admit: 2019-12-29 | Discharge: 2019-12-29 | Disposition: A | Discharge status: Home / Self Care | Payer: PPO | Source: Ambulatory Visit | Attending: Critical Care Medicine | Admitting: Critical Care Medicine

## 2019-12-29 ENCOUNTER — Other Ambulatory Visit: Payer: Self-pay | Admitting: Internal Medicine

## 2019-12-29 ENCOUNTER — Other Ambulatory Visit: Payer: Self-pay

## 2019-12-29 DIAGNOSIS — Z8616 Personal history of COVID-19: Secondary | ICD-10-CM | POA: Diagnosis not present

## 2019-12-29 DIAGNOSIS — R059 Cough, unspecified: Secondary | ICD-10-CM

## 2019-12-29 DIAGNOSIS — R05 Cough: Secondary | ICD-10-CM | POA: Insufficient documentation

## 2019-12-29 DIAGNOSIS — R0600 Dyspnea, unspecified: Secondary | ICD-10-CM | POA: Insufficient documentation

## 2019-12-29 DIAGNOSIS — R0602 Shortness of breath: Secondary | ICD-10-CM | POA: Diagnosis not present

## 2019-12-29 DIAGNOSIS — Z1231 Encounter for screening mammogram for malignant neoplasm of breast: Secondary | ICD-10-CM

## 2019-12-30 DIAGNOSIS — N3010 Interstitial cystitis (chronic) without hematuria: Secondary | ICD-10-CM | POA: Diagnosis not present

## 2020-01-01 NOTE — Progress Notes (Signed)
Please let Kimberly Roy know that her CT scan does not show scarring or fibrosis in her lungs but does look like what we see with sometimes see with asthma. We will follow up in clinic after her PFTs. Thanks!  LPC

## 2020-01-03 DIAGNOSIS — D649 Anemia, unspecified: Secondary | ICD-10-CM | POA: Diagnosis not present

## 2020-01-03 DIAGNOSIS — I110 Hypertensive heart disease with heart failure: Secondary | ICD-10-CM | POA: Diagnosis not present

## 2020-01-03 DIAGNOSIS — E039 Hypothyroidism, unspecified: Secondary | ICD-10-CM | POA: Diagnosis not present

## 2020-01-03 DIAGNOSIS — I509 Heart failure, unspecified: Secondary | ICD-10-CM | POA: Diagnosis not present

## 2020-01-03 DIAGNOSIS — U071 COVID-19: Secondary | ICD-10-CM | POA: Diagnosis not present

## 2020-01-03 DIAGNOSIS — E7800 Pure hypercholesterolemia, unspecified: Secondary | ICD-10-CM | POA: Diagnosis not present

## 2020-01-03 DIAGNOSIS — I872 Venous insufficiency (chronic) (peripheral): Secondary | ICD-10-CM | POA: Diagnosis not present

## 2020-01-03 DIAGNOSIS — I890 Lymphedema, not elsewhere classified: Secondary | ICD-10-CM | POA: Diagnosis not present

## 2020-01-03 DIAGNOSIS — F439 Reaction to severe stress, unspecified: Secondary | ICD-10-CM | POA: Diagnosis not present

## 2020-01-03 DIAGNOSIS — I700 Atherosclerosis of aorta: Secondary | ICD-10-CM | POA: Diagnosis not present

## 2020-01-03 DIAGNOSIS — Z9181 History of falling: Secondary | ICD-10-CM | POA: Diagnosis not present

## 2020-01-03 DIAGNOSIS — K219 Gastro-esophageal reflux disease without esophagitis: Secondary | ICD-10-CM | POA: Diagnosis not present

## 2020-01-03 DIAGNOSIS — Z96653 Presence of artificial knee joint, bilateral: Secondary | ICD-10-CM | POA: Diagnosis not present

## 2020-01-03 DIAGNOSIS — M26629 Arthralgia of temporomandibular joint, unspecified side: Secondary | ICD-10-CM | POA: Diagnosis not present

## 2020-01-03 DIAGNOSIS — I272 Pulmonary hypertension, unspecified: Secondary | ICD-10-CM | POA: Diagnosis not present

## 2020-01-03 DIAGNOSIS — H9209 Otalgia, unspecified ear: Secondary | ICD-10-CM | POA: Diagnosis not present

## 2020-01-03 DIAGNOSIS — M810 Age-related osteoporosis without current pathological fracture: Secondary | ICD-10-CM | POA: Diagnosis not present

## 2020-01-04 DIAGNOSIS — N3010 Interstitial cystitis (chronic) without hematuria: Secondary | ICD-10-CM | POA: Diagnosis not present

## 2020-01-05 ENCOUNTER — Other Ambulatory Visit: Payer: Self-pay

## 2020-01-05 ENCOUNTER — Ambulatory Visit (INDEPENDENT_AMBULATORY_CARE_PROVIDER_SITE_OTHER): Payer: PPO | Admitting: Internal Medicine

## 2020-01-05 ENCOUNTER — Encounter: Payer: Self-pay | Admitting: Internal Medicine

## 2020-01-05 DIAGNOSIS — M25551 Pain in right hip: Secondary | ICD-10-CM

## 2020-01-05 DIAGNOSIS — K219 Gastro-esophageal reflux disease without esophagitis: Secondary | ICD-10-CM

## 2020-01-05 DIAGNOSIS — G4733 Obstructive sleep apnea (adult) (pediatric): Secondary | ICD-10-CM

## 2020-01-05 DIAGNOSIS — Z96653 Presence of artificial knee joint, bilateral: Secondary | ICD-10-CM | POA: Diagnosis not present

## 2020-01-05 DIAGNOSIS — Z9181 History of falling: Secondary | ICD-10-CM | POA: Diagnosis not present

## 2020-01-05 DIAGNOSIS — F439 Reaction to severe stress, unspecified: Secondary | ICD-10-CM

## 2020-01-05 DIAGNOSIS — E7800 Pure hypercholesterolemia, unspecified: Secondary | ICD-10-CM | POA: Diagnosis not present

## 2020-01-05 DIAGNOSIS — I872 Venous insufficiency (chronic) (peripheral): Secondary | ICD-10-CM | POA: Diagnosis not present

## 2020-01-05 DIAGNOSIS — G629 Polyneuropathy, unspecified: Secondary | ICD-10-CM | POA: Diagnosis not present

## 2020-01-05 DIAGNOSIS — I700 Atherosclerosis of aorta: Secondary | ICD-10-CM | POA: Diagnosis not present

## 2020-01-05 DIAGNOSIS — M7989 Other specified soft tissue disorders: Secondary | ICD-10-CM

## 2020-01-05 DIAGNOSIS — I10 Essential (primary) hypertension: Secondary | ICD-10-CM

## 2020-01-05 DIAGNOSIS — U071 COVID-19: Secondary | ICD-10-CM | POA: Diagnosis not present

## 2020-01-05 DIAGNOSIS — M5416 Radiculopathy, lumbar region: Secondary | ICD-10-CM | POA: Diagnosis not present

## 2020-01-05 DIAGNOSIS — S31809D Unspecified open wound of unspecified buttock, subsequent encounter: Secondary | ICD-10-CM | POA: Diagnosis not present

## 2020-01-05 DIAGNOSIS — I509 Heart failure, unspecified: Secondary | ICD-10-CM

## 2020-01-05 DIAGNOSIS — E039 Hypothyroidism, unspecified: Secondary | ICD-10-CM | POA: Diagnosis not present

## 2020-01-05 DIAGNOSIS — M7061 Trochanteric bursitis, right hip: Secondary | ICD-10-CM | POA: Diagnosis not present

## 2020-01-05 DIAGNOSIS — E785 Hyperlipidemia, unspecified: Secondary | ICD-10-CM

## 2020-01-05 DIAGNOSIS — D649 Anemia, unspecified: Secondary | ICD-10-CM | POA: Diagnosis not present

## 2020-01-05 DIAGNOSIS — I272 Pulmonary hypertension, unspecified: Secondary | ICD-10-CM | POA: Diagnosis not present

## 2020-01-05 DIAGNOSIS — I890 Lymphedema, not elsewhere classified: Secondary | ICD-10-CM | POA: Diagnosis not present

## 2020-01-05 DIAGNOSIS — M810 Age-related osteoporosis without current pathological fracture: Secondary | ICD-10-CM | POA: Diagnosis not present

## 2020-01-05 DIAGNOSIS — I110 Hypertensive heart disease with heart failure: Secondary | ICD-10-CM | POA: Diagnosis not present

## 2020-01-05 LAB — CBC WITH DIFFERENTIAL/PLATELET
Basophils Absolute: 0.1 10*3/uL (ref 0.0–0.1)
Basophils Relative: 0.6 % (ref 0.0–3.0)
Eosinophils Absolute: 0.3 10*3/uL (ref 0.0–0.7)
Eosinophils Relative: 3.2 % (ref 0.0–5.0)
HCT: 32.6 % — ABNORMAL LOW (ref 36.0–46.0)
Hemoglobin: 10.8 g/dL — ABNORMAL LOW (ref 12.0–15.0)
Lymphocytes Relative: 22.7 % (ref 12.0–46.0)
Lymphs Abs: 2 10*3/uL (ref 0.7–4.0)
MCHC: 33.3 g/dL (ref 30.0–36.0)
MCV: 100.1 fl — ABNORMAL HIGH (ref 78.0–100.0)
Monocytes Absolute: 0.7 10*3/uL (ref 0.1–1.0)
Monocytes Relative: 7.6 % (ref 3.0–12.0)
Neutro Abs: 5.8 10*3/uL (ref 1.4–7.7)
Neutrophils Relative %: 65.9 % (ref 43.0–77.0)
Platelets: 185 10*3/uL (ref 150.0–400.0)
RBC: 3.26 Mil/uL — ABNORMAL LOW (ref 3.87–5.11)
RDW: 15.1 % (ref 11.5–15.5)
WBC: 8.8 10*3/uL (ref 4.0–10.5)

## 2020-01-05 LAB — BASIC METABOLIC PANEL
BUN: 16 mg/dL (ref 6–23)
CO2: 32 mEq/L (ref 19–32)
Calcium: 10 mg/dL (ref 8.4–10.5)
Chloride: 103 mEq/L (ref 96–112)
Creatinine, Ser: 0.77 mg/dL (ref 0.40–1.20)
GFR: 70.94 mL/min (ref >60.00)
Glucose, Bld: 98 mg/dL (ref 70–99)
Potassium: 4.9 mEq/L (ref 3.5–5.1)
Sodium: 142 mEq/L (ref 135–145)

## 2020-01-05 LAB — LIPID PANEL
Cholesterol: 144 mg/dL (ref 0–200)
HDL: 53.3 mg/dL (ref >39.00)
LDL Cholesterol: 70 mg/dL (ref 0–99)
NonHDL: 91.18
Total CHOL/HDL Ratio: 3
Triglycerides: 104 mg/dL (ref 0.0–149.0)
VLDL: 20.8 mg/dL (ref 0.0–40.0)

## 2020-01-05 LAB — HEPATIC FUNCTION PANEL
ALT: 7 U/L (ref 0–35)
AST: 16 U/L (ref 0–37)
Albumin: 4.3 g/dL (ref 3.5–5.2)
Alkaline Phosphatase: 94 U/L (ref 39–117)
Bilirubin, Direct: 0.2 mg/dL (ref 0.0–0.3)
Total Bilirubin: 0.7 mg/dL (ref 0.2–1.2)
Total Protein: 6.8 g/dL (ref 6.0–8.3)

## 2020-01-05 LAB — IBC + FERRITIN
Ferritin: 51.8 ng/mL (ref 10.0–291.0)
Iron: 121 ug/dL (ref 42–145)
Saturation Ratios: 35.9 % (ref 20.0–50.0)
Transferrin: 241 mg/dL (ref 212.0–360.0)

## 2020-01-05 LAB — VITAMIN B12: Vitamin B-12: 670 pg/mL (ref 211–911)

## 2020-01-05 LAB — TSH: TSH: 1.01 u[IU]/mL (ref 0.35–4.50)

## 2020-01-05 NOTE — Progress Notes (Signed)
Patient ID: Kimberly Roy, female   DOB: Jul 08, 1931, 86 y.o.   MRN: [id number]   Subjective:    Patient ID: Kimberly Roy, female    DOB: 10-19-1930, 86 y.o.   MRN: [id number]  HPI This visit occurred during the SARS-CoV-2 public health emergency.  Safety protocols were in place, including screening questions prior to the visit, additional usage of staff PPE, and extensive cleaning of exam room while observing appropriate contact time as indicated for disinfecting solutions.  Patient here as a work in appt with concerns regarding right hip and leg pain.  Increased pain with sitting and standing - right buttock and into hip.  Previous wound - right buttock - healed.  Pain in right hip - to knee and occasionally into toes.  Present now for 2-3 months.  Sitting worse.  Taking hydrocodone.  Also on morphine 76m q 12 hours and has morphine 315mto take prn.  Also on cymbalta and gabapentin.  Has seen ortho previously.  S/p hip surgery.  She is wearing oxygen at night.  Seeing pulmonary.  Pulse ox remaining 94-96%.  S/p covid.  Breathing is better.  She is trying to ride her bike for exercise.  Also reports that her caretaker has mentioned her right foot feels cold.  No ulcers.  No pain.  Handling stress.    Past Medical History:  Diagnosis Date  . Anemia   . Anxiety   . Chest pain   . CHF (congestive heart failure) ([place]  . Constipation   . DDD (degenerative disc disease), cervical   . Depression   . DVT (deep venous thrombosis) ([place]  . Dysphonia   . Dyspnea   . Fatty liver   . Fatty liver   . Headache   . Hyperlipidemia   . Hyperpiesia   . Hypertension   . Hypothyroidism   . Interstitial cystitis   . Left ventricular dysfunction   . Lymphedema   . Nephrolithiasis   . Obstructive sleep apnea   . Osteoarthritis    knees/cervical and lumbar spine  . Pulmonary hypertension ([place]  . Pulmonary nodules    followed by Dr FlRaul Del. Pure hypercholesterolemia   . Renal cyst    right   Past  Surgical History:  Procedure Laterality Date  . ABDOMINAL HYSTERECTOMY     ovaries left in place  . APPENDECTOMY    . Back Surgeries    . BACK SURGERY    . BREAST REDUCTION SURGERY     3/99  . CARDIAC CATHETERIZATION    . cataracts Bilateral   . CERVICAL SPINE SURGERY    . ESOPHAGEAL MANOMETRY N/A 08/02/2015   Procedure: ESOPHAGEAL MANOMETRY (EM);  Surgeon: MaJosefine ClassMD;  [facility];  Service: Endoscopy;  Laterality: N/A;  . ESOPHAGOGASTRODUODENOSCOPY N/A 02/27/2015   Procedure: ESOPHAGOGASTRODUODENOSCOPY (EGD);  Surgeon: PaHulen LusterMD;  [facility];  Service: Gastroenterology;  Laterality: N/A;  . ESOPHAGOGASTRODUODENOSCOPY (EGD) WITH PROPOFOL N/A 10/30/2018   Procedure: ESOPHAGOGASTRODUODENOSCOPY (EGD) WITH PROPOFOL;  Surgeon: SkLollie SailsMD;  [facility];  Service: Endoscopy;  Laterality: N/A;  . EXCISIONAL HEMORRHOIDECTOMY    . EYE SURGERY    . FRACTURE SURGERY    . HEMORRHOID SURGERY    . HIP SURGERY  2013   Right hip surgery  . JOINT REPLACEMENT    . KNEE ARTHROSCOPY     left and right  . ORIF FEMUR FRACTURE Right 08/07/2018   Procedure:  OPEN REDUCTION INTERNAL FIXATION (ORIF) DISTAL FEMUR FRACTURE;  Surgeon: Hessie Knows, MD;  Location: [facility];  Service: Orthopedics;  Laterality: Right;  . REDUCTION MAMMAPLASTY Bilateral YRS AGO  . REPLACEMENT TOTAL KNEE Bilateral   . rotator cuff surgery     blilateral  . TONSILECTOMY/ADENOIDECTOMY WITH MYRINGOTOMY    . VISCERAL ARTERY INTERVENTION N/A 02/25/2019   Procedure: VISCERAL ARTERY INTERVENTION;  Surgeon: Algernon Huxley, MD;  Location: [facility];  Service: Cardiovascular;  Laterality: N/A;   Family History  Problem Relation Age of Onset  . Heart disease Mother   . Stroke Mother   . Hypertension Mother   . Heart disease Father        myocardial infarction age 19  . Breast cancer Neg Hx    Social History   Socioeconomic History  . Marital status:  Widowed    Spouse name: Not on file  . Number of children: 2  . Years of education: Not on file  . Highest education level: Not on file  Occupational History    Comment: insurance agency  Tobacco Use  . Smoking status: Never Smoker  . Smokeless tobacco: Never Used  Substance and Sexual Activity  . Alcohol use: No    Alcohol/week: 0.0 standard drinks  . Drug use: Never  . Sexual activity: Not Currently  Other Topics Concern  . Not on file  Social History Narrative  . Not on file   Social Determinants of Health   Financial Resource Strain:   . Difficulty of Paying Living Expenses:   Food Insecurity:   . Worried About Charity fundraiser in the Last Year:   . Arboriculturist in the Last Year:   Transportation Needs:   . Film/video editor (Medical):   Marland Kitchen Lack of Transportation (Non-Medical):   Physical Activity:   . Days of Exercise per Week:   . Minutes of Exercise per Session:   Stress:   . Feeling of Stress :   Social Connections:   . Frequency of Communication with Friends and Family:   . Frequency of Social Gatherings with Friends and Family:   . Attends Religious Services:   . Active Member of Clubs or Organizations:   . Attends Archivist Meetings:   Marland Kitchen Marital Status:     Outpatient Encounter Medications as of 01/05/2020  Medication Sig  . albuterol (VENTOLIN HFA) 108 (90 Base) MCG/ACT inhaler INHALE 2 PUFFS INTO THE LUNGS EVERY 6 (SIX) HOURS AS NEEDED FOR WHEEZING OR SHORTNESS OF BREATH  . aluminum-magnesium hydroxide-simethicone (MAALOX) 119-147-82 MG/5ML SUSP Take 15 mLs by mouth 4 (four) times daily -  before meals and at bedtime.   Marland Kitchen amLODipine (NORVASC) 10 MG tablet TAKE 1 TABLET BY MOUTH DAILY  . atorvastatin (LIPITOR) 20 MG tablet TAKE 1 TABLET BY MOUTH AT BEDTIME  . azelastine (ASTELIN) 0.1 % nasal spray Place 1 spray into both nostrils 2 (two) times daily. Use in each nostril as directed  . benazepril (LOTENSIN) 40 MG tablet TAKE 1 TABLET BY  MOUTH DAILY  . budesonide-formoterol (SYMBICORT) 160-4.5 MCG/ACT inhaler Inhale 2 puffs into the lungs 2 (two) times daily.  . busPIRone (BUSPAR) 10 MG tablet Take 10 mg by mouth 2 (two) times daily.   . calcium citrate-vitamin D (CITRACAL+D) 315-200 MG-UNIT tablet Take 1 tablet by mouth daily.  [name] Sodium 30-100 MG CAPS Take 1 capsule by mouth daily.   . Cholecalciferol (VITAMIN D3) 1000 UNITS CAPS Take by  mouth.  . Diclofenac Sodium 3 % GEL Apply 1 g topically 2 (two) times daily as needed.  . DULoxetine (CYMBALTA) 60 MG capsule TAKE 1 CAPSULE BY MOUTH EVERY DAY.  . furosemide (LASIX) 20 MG tablet TAKE 1 TABLET BY MOUTH DAILY AS NEEDED  . gabapentin (NEURONTIN) 300 MG capsule Take 2 capsules (600 mg total) by mouth 3 (three) times daily.  . hydrochlorothiazide (MICROZIDE) 12.5 MG capsule Take 12.5 mg by mouth daily.  Marland Kitchen HYDROcodone-acetaminophen (NORCO) 10-325 MG tablet Take 1 tablet by mouth 3 (three) times daily.  . magnesium oxide (MAG-OX) 400 MG tablet Take 1 tablet (400 mg total) by mouth daily.  . metoprolol succinate (TOPROL-XL) 100 MG 24 hr tablet Take 100 mg by mouth 2 (two) times daily.  Marland Kitchen morphine (MS CONTIN) 60 MG 12 hr tablet Take 1 tablet (60 mg total) by mouth 2 (two) times daily.  Melynda Ripple Tosylate (SYMPROIC) 0.2 MG TABS Take 0.2 mg by mouth daily.  Marland Kitchen nystatin (NYAMYC) powder APPLY TOPICALLY 2 TIMES DAILY  . nystatin cream (MYCOSTATIN) Apply 1 application topically daily as needed.  . polyethylene glycol powder (GLYCOLAX/MIRALAX) 17 GM/SCOOP powder MIX 17 GRAMS AS MARKED ON BOTTLE TOP IN 8 OUNCES OF WATER AND DRINK ONCE A DAY AS DIRECTED.  . Probiotic Product (ALIGN PO) Take by mouth daily.  . RABEprazole (ACIPHEX) 20 MG tablet Take 1 tablet (20 mg total) by mouth 2 (two) times daily before a meal.  . senna (SENOKOT) 8.6 MG TABS tablet Take 1 tablet by mouth 2 (two) times daily.  . Simethicone (GAS-X PO) Take 2 tablets by mouth 2 (two) times daily.  .  sodium chloride (OCEAN) 0.65 % nasal spray Place 1 spray into the nose as needed.  Marland Kitchen SYNTHROID 100 MCG tablet TAKE 1 TABLET BY MOUTH DAILY  . traZODone (DESYREL) 50 MG tablet Take 25-50 mg by mouth at bedtime as needed.  . triamcinolone cream (KENALOG) 0.1 %    No facility-administered encounter medications on file as of 01/05/2020.    Review of Systems  Constitutional: Negative for appetite change and unexpected weight change.  HENT: Negative for congestion and sinus pressure.   Respiratory: Negative for cough, chest tightness and shortness of breath.   Cardiovascular: Negative for chest pain and palpitations.       No increased leg swelling.  Wears compression hose.    Gastrointestinal: Negative for abdominal pain, diarrhea, nausea and vomiting.  Genitourinary: Negative for difficulty urinating and dysuria.  Musculoskeletal: Negative for myalgias.       Increased swelling - right hip and leg.    Skin: Negative for color change and rash.  Neurological: Negative for dizziness, light-headedness and headaches.  Psychiatric/Behavioral: Negative for agitation and dysphoric mood.       Objective:    Physical Exam Vitals reviewed.  Constitutional:      General: She is not in acute distress.    Appearance: Normal appearance.  HENT:     Head: Normocephalic and atraumatic.     Right Ear: External ear normal.     Left Ear: External ear normal.  Eyes:     General: No scleral icterus.       Right eye: No discharge.        Left eye: No discharge.     Conjunctiva/sclera: Conjunctivae normal.  Neck:     Thyroid: No thyromegaly.  Cardiovascular:     Rate and Rhythm: Normal rate and regular rhythm.  Pulmonary:     Effort: No  respiratory distress.     Breath sounds: Normal breath sounds. No wheezing.  Abdominal:     General: Bowel sounds are normal.     Palpations: Abdomen is soft.     Tenderness: There is no abdominal tenderness.  Musculoskeletal:        General: No swelling or  tenderness.     Cervical back: Neck supple. No tenderness.     Comments: Increased pain right hip - sitting.  Also noted when first up walking.  DP pulses palpable and equal bilaterally.    Lymphadenopathy:     Cervical: No cervical adenopathy.  Skin:    Findings: No erythema or rash.  Neurological:     Mental Status: She is alert.  Psychiatric:        Mood and Affect: Mood normal.        Behavior: Behavior normal.     BP 138/82   Pulse 69   Temp 98 F (36.7 C)   Resp 16   Ht _0  (1.575 m)   Wt 179 lb (81.2 kg)   SpO2 96%   BMI 32.74 kg/m  Wt Readings from Last 3 Encounters:  01/05/20 179 lb (81.2 kg)  12/20/19 189 lb (85.7 kg)  12/17/19 189 lb (85.7 kg)     Lab Results  Component Value Date   WBC 8.8 01/05/2020   HGB 10.8 (L) 01/05/2020   HCT 32.6 (L) 01/05/2020   PLT 185.0 01/05/2020   GLUCOSE 98 01/05/2020   CHOL 144 01/05/2020   TRIG 104.0 01/05/2020   HDL 53.30 01/05/2020   LDLCALC 70 01/05/2020   ALT 7 01/05/2020   AST 16 01/05/2020   NA 142 01/05/2020   K 4.9 01/05/2020   CL 103 01/05/2020   CREATININE 0.77 01/05/2020   BUN 16 01/05/2020   CO2 32 01/05/2020   TSH 1.01 01/05/2020   INR 0.89 08/06/2018   HGBA1C 6.0 10/19/2014    CT Chest High Resolution  Result Date: 12/29/2019 CLINICAL DATA:  Shortness of breath on exertion, history of COVID infection in January 2021. Shortness of breath since. EXAM: CT CHEST WITHOUT CONTRAST TECHNIQUE: Multidetector CT imaging of the chest was performed following the standard protocol without intravenous contrast. High resolution imaging of the lungs, as well as inspiratory and expiratory imaging, was performed. COMPARISON:  07/15/2012. FINDINGS: Cardiovascular: Atherosclerotic calcification of the aorta, aortic valve and coronary arteries. Heart is enlarged. No pericardial effusion. Mediastinum/Nodes: Mediastinal lymph nodes are not enlarged by CT size criteria. No axillary adenopathy. Hilar regions are difficult to  definitively evaluate without IV contrast. Esophagus is grossly unremarkable. Lungs/Pleura: Mosaic pulmonary parenchymal attenuation with air trapping on expiratory phase imaging. Pulmonary nodules measure up to 6 mm in the peripheral right lower lobe (3/99), unchanged and considered benign. Central bronchiectasis. Basilar septal thickening. Image quality in the lung bases is degraded by respiratory motion. No definitive subpleural reticulation, traction, architectural distortion or honeycombing. Small bilateral pleural effusions. Upper Abdomen: Visualized portion of the liver is unremarkable. 2.1 cm right adrenal myelolipoma, unchanged. Visualized portions of the left adrenal gland, kidneys, spleen, pancreas, stomach and bowel are grossly unremarkable. Musculoskeletal: Postoperative and degenerative changes in the spine. Spinal stimulator is in place. Bilateral shoulder are show arthritis. IMPRESSION: 1. No definitive evidence of interstitial lung disease. 2. Mild congestive heart failure. 3. Air trapping is indicative of small airways disease. 4. Scattered pulmonary nodules are similar to 07/15/2012 and considered benign. 5. Aortic atherosclerosis (ICD10-I70.0). Coronary artery calcification. Electronically Signed   By:  Lorin Picket M.D.   On: 12/29/2019 14:46       Assessment & Plan:   Problem List Items Addressed This Visit    Anemia    Follow cbc.        Relevant Orders   CBC with Differential/Platelet (Completed)   Vitamin B12 (Completed)   IBC + Ferritin (Completed)   CHF (congestive heart failure) ([place])    Does not appear to be volume overloaded on exam today.  Continue benazepril, fluid pill.  Sees cardiology.  Request f/u with Dr Saralyn Pilar.  States overdue.        Relevant Orders   Ambulatory referral to Cardiology   GERD (gastroesophageal reflux disease)    Aciphex.  Controlled.       Hyperlipidemia    On lipitor.  Low cholesterol diet and exercise.  Follow lipid panel and  liver function tests.        Relevant Orders   Hepatic function panel (Completed)   Lipid panel (Completed)   Hypertension    Blood pressure as outlined.  Continue hctz, benazepril and amlodipine.  Follow pressures.  Follow metabolic panel.        Relevant Orders   Basic metabolic panel (Completed)   Hypothyroidism    On thyroid replacement.  Follow tsh.        Relevant Orders   TSH (Completed)   Leg swelling    Continue compression hose.        Neuropathy    On gabapentin.       OSA (obstructive sleep apnea)    CPAP.       Pulmonary hypertension ([place])    Has known sleep apnea.  Followed by pulmonary.        Right hip pain    Increased pain as outlined.  S/p surgery.  Contacted ortho.  They will see pt today for further evaluation.        Stress    Increased stress with recent death of her husband.  Overall handling things well.  Follow.  Does not feel needs any further intervention.        Wound of buttock    Healed.            Einar Pheasant, MD

## 2020-01-05 NOTE — Patient Instructions (Signed)
appt in Tobie Poet 2:45 - today 01/05/20

## 2020-01-07 ENCOUNTER — Encounter: Payer: Self-pay | Admitting: Internal Medicine

## 2020-01-10 ENCOUNTER — Encounter: Payer: Self-pay | Admitting: Internal Medicine

## 2020-01-10 DIAGNOSIS — M25551 Pain in right hip: Secondary | ICD-10-CM | POA: Insufficient documentation

## 2020-01-10 NOTE — Assessment & Plan Note (Signed)
Continue compression hose.   

## 2020-01-10 NOTE — Assessment & Plan Note (Signed)
Has known sleep apnea.  Followed by pulmonary.

## 2020-01-10 NOTE — Assessment & Plan Note (Signed)
Healed

## 2020-01-10 NOTE — Assessment & Plan Note (Signed)
Increased stress with recent death of her husband.  Overall handling things well.  Follow.  Does not feel needs any further intervention.

## 2020-01-10 NOTE — Assessment & Plan Note (Signed)
Does not appear to be volume overloaded on exam today.  Continue benazepril, fluid pill.  Sees cardiology.  Request f/u with Dr Saralyn Pilar.  States overdue.

## 2020-01-10 NOTE — Assessment & Plan Note (Signed)
On gabapentin.

## 2020-01-10 NOTE — Assessment & Plan Note (Signed)
Increased pain as outlined.  S/p surgery.  Contacted ortho.  They will see pt today for further evaluation.

## 2020-01-10 NOTE — Assessment & Plan Note (Signed)
Blood pressure as outlined.  Continue hctz, benazepril and amlodipine.  Follow pressures.  Follow metabolic panel.

## 2020-01-10 NOTE — Assessment & Plan Note (Signed)
On thyroid replacement.  Follow tsh.

## 2020-01-10 NOTE — Assessment & Plan Note (Signed)
On lipitor.  Low cholesterol diet and exercise.  Follow lipid panel and liver function tests.

## 2020-01-10 NOTE — Assessment & Plan Note (Signed)
CPAP.  

## 2020-01-10 NOTE — Assessment & Plan Note (Signed)
Aciphex.  Controlled.

## 2020-01-10 NOTE — Assessment & Plan Note (Signed)
Follow cbc.

## 2020-01-11 ENCOUNTER — Telehealth: Payer: Self-pay | Admitting: Critical Care Medicine

## 2020-01-11 DIAGNOSIS — I110 Hypertensive heart disease with heart failure: Secondary | ICD-10-CM | POA: Diagnosis not present

## 2020-01-11 DIAGNOSIS — I890 Lymphedema, not elsewhere classified: Secondary | ICD-10-CM | POA: Diagnosis not present

## 2020-01-11 DIAGNOSIS — I509 Heart failure, unspecified: Secondary | ICD-10-CM | POA: Diagnosis not present

## 2020-01-11 DIAGNOSIS — M810 Age-related osteoporosis without current pathological fracture: Secondary | ICD-10-CM | POA: Diagnosis not present

## 2020-01-11 DIAGNOSIS — I700 Atherosclerosis of aorta: Secondary | ICD-10-CM | POA: Diagnosis not present

## 2020-01-11 DIAGNOSIS — N3010 Interstitial cystitis (chronic) without hematuria: Secondary | ICD-10-CM | POA: Diagnosis not present

## 2020-01-11 DIAGNOSIS — K219 Gastro-esophageal reflux disease without esophagitis: Secondary | ICD-10-CM | POA: Diagnosis not present

## 2020-01-11 DIAGNOSIS — D649 Anemia, unspecified: Secondary | ICD-10-CM | POA: Diagnosis not present

## 2020-01-11 DIAGNOSIS — Z96653 Presence of artificial knee joint, bilateral: Secondary | ICD-10-CM | POA: Diagnosis not present

## 2020-01-11 DIAGNOSIS — E7800 Pure hypercholesterolemia, unspecified: Secondary | ICD-10-CM | POA: Diagnosis not present

## 2020-01-11 DIAGNOSIS — E039 Hypothyroidism, unspecified: Secondary | ICD-10-CM | POA: Diagnosis not present

## 2020-01-11 DIAGNOSIS — I872 Venous insufficiency (chronic) (peripheral): Secondary | ICD-10-CM | POA: Diagnosis not present

## 2020-01-11 DIAGNOSIS — F439 Reaction to severe stress, unspecified: Secondary | ICD-10-CM | POA: Diagnosis not present

## 2020-01-11 DIAGNOSIS — I272 Pulmonary hypertension, unspecified: Secondary | ICD-10-CM | POA: Diagnosis not present

## 2020-01-11 DIAGNOSIS — Z9181 History of falling: Secondary | ICD-10-CM | POA: Diagnosis not present

## 2020-01-11 DIAGNOSIS — U071 COVID-19: Secondary | ICD-10-CM | POA: Diagnosis not present

## 2020-01-11 NOTE — Telephone Encounter (Signed)
ATC daughter [name] [phone] per DPR left detailed message letting her know about message from Dr. Carlis Abbott and results of ONO. Nothing further needed at this time.

## 2020-01-11 NOTE — Telephone Encounter (Signed)
ONO with 51 min below 89%, lowest saturation 88%. Does not need supplemental O2 at night at this time.  Report to be scanned into chart.  Julian Hy, DO 01/11/20 9:22 AM [facility]

## 2020-01-13 DIAGNOSIS — N3010 Interstitial cystitis (chronic) without hematuria: Secondary | ICD-10-CM | POA: Diagnosis not present

## 2020-01-14 ENCOUNTER — Other Ambulatory Visit (HOSPITAL_COMMUNITY)
Admission: RE | Admit: 2020-01-14 | Discharge: 2020-01-14 | Disposition: A | Discharge status: Home / Self Care | Payer: PPO | Source: Ambulatory Visit | Attending: Critical Care Medicine | Admitting: Critical Care Medicine

## 2020-01-14 ENCOUNTER — Telehealth: Payer: Self-pay | Admitting: Internal Medicine

## 2020-01-14 ENCOUNTER — Other Ambulatory Visit (HOSPITAL_COMMUNITY): Payer: PPO

## 2020-01-14 DIAGNOSIS — Z20822 Contact with and (suspected) exposure to covid-19: Secondary | ICD-10-CM | POA: Insufficient documentation

## 2020-01-14 DIAGNOSIS — Z01812 Encounter for preprocedural laboratory examination: Secondary | ICD-10-CM | POA: Insufficient documentation

## 2020-01-14 LAB — SARS CORONAVIRUS 2 (TAT 6-24 HRS): SARS Coronavirus 2: NEGATIVE

## 2020-01-14 NOTE — Telephone Encounter (Signed)
Pt would like to know how to get meals on wheels. Please advise

## 2020-01-14 NOTE — Telephone Encounter (Signed)
Can you help with this?

## 2020-01-17 ENCOUNTER — Encounter: Payer: Self-pay | Admitting: Adult Health

## 2020-01-17 ENCOUNTER — Ambulatory Visit (INDEPENDENT_AMBULATORY_CARE_PROVIDER_SITE_OTHER): Payer: PPO | Admitting: Critical Care Medicine

## 2020-01-17 ENCOUNTER — Ambulatory Visit: Payer: PPO | Admitting: Adult Health

## 2020-01-17 ENCOUNTER — Other Ambulatory Visit: Payer: Self-pay

## 2020-01-17 ENCOUNTER — Ambulatory Visit: Payer: PPO | Admitting: Critical Care Medicine

## 2020-01-17 DIAGNOSIS — R918 Other nonspecific abnormal finding of lung field: Secondary | ICD-10-CM | POA: Diagnosis not present

## 2020-01-17 DIAGNOSIS — J9611 Chronic respiratory failure with hypoxia: Secondary | ICD-10-CM | POA: Diagnosis not present

## 2020-01-17 DIAGNOSIS — R0600 Dyspnea, unspecified: Secondary | ICD-10-CM | POA: Diagnosis not present

## 2020-01-17 DIAGNOSIS — U071 COVID-19: Secondary | ICD-10-CM

## 2020-01-17 DIAGNOSIS — R059 Cough, unspecified: Secondary | ICD-10-CM

## 2020-01-17 DIAGNOSIS — Z8616 Personal history of COVID-19: Secondary | ICD-10-CM

## 2020-01-17 LAB — PULMONARY FUNCTION TEST
DL/VA % pred: 100 %
DL/VA: 4.13 ml/min/mmHg/L
DLCO cor % pred: 71 %
DLCO cor: 12.35 ml/min/mmHg
DLCO unc % pred: 65 %
DLCO unc: 11.24 ml/min/mmHg
FEF 25-75 Post: 1.55 L/sec
FEF 25-75 Pre: 1.24 L/sec
FEF2575-%Change-Post: 24 %
FEF2575-%Pred-Post: 157 %
FEF2575-%Pred-Pre: 126 %
FEV1-%Change-Post: 4 %
FEV1-%Pred-Post: 92 %
FEV1-%Pred-Pre: 88 %
FEV1-Post: 1.42 L
FEV1-Pre: 1.36 L
FEV1FVC-%Change-Post: 4 %
FEV1FVC-%Pred-Pre: 109 %
FEV6-%Change-Post: 0 %
FEV6-%Pred-Post: 86 %
FEV6-%Pred-Pre: 87 %
FEV6-Post: 1.71 L
FEV6-Pre: 1.71 L
FEV6FVC-%Pred-Post: 107 %
FEV6FVC-%Pred-Pre: 107 %
FVC-%Change-Post: 0 %
FVC-%Pred-Post: 81 %
FVC-%Pred-Pre: 81 %
FVC-Post: 1.71 L
FVC-Pre: 1.71 L
Post FEV1/FVC ratio: 83 %
Post FEV6/FVC ratio: 100 %
Pre FEV1/FVC ratio: 80 %
Pre FEV6/FVC Ratio: 100 %
RV % pred: 81 %
RV: 1.95 L
TLC % pred: 76 %
TLC: 3.62 L

## 2020-01-17 NOTE — Assessment & Plan Note (Signed)
Continue on O2 At bedtime   ONO was done on O2 at 2l/m showing no significant desats.

## 2020-01-17 NOTE — Telephone Encounter (Signed)
Thank you.

## 2020-01-17 NOTE — Assessment & Plan Note (Signed)
Developed COVID 19 in 09/2019 with long haul symptoms .  She has associated deconditioning. HRCT Chest shows no post covid fibrosis .  Continue to monitor symptoms

## 2020-01-17 NOTE — Assessment & Plan Note (Signed)
Stable on CT chest since 2013 c/w benign etiology

## 2020-01-17 NOTE — Assessment & Plan Note (Signed)
Dyspnea suspect is multifactoral in nature with multiple co-morbidities - PFT shows normal lung function with decreased DLCO - May have component of Reactive Airway -Had perceived benefit from Symbicort. Good news is no post covid fibrosis /ILD on HRCT chest .  Would advance activity as tolerated.   Plan  Patient Instructions  Continue on Oxygen 2l/m At bedtime  .  Continue on Symbicort 2 puffs Twice daily, rinse after use.  Continue on follow up with Cardiology  Low salt diet  Follow up with Dr. Carlis Abbott in 4 months and As needed   Please contact office for sooner follow up if symptoms do not improve or worsen or seek emergency care

## 2020-01-17 NOTE — Progress Notes (Signed)
PFT done today. 

## 2020-01-17 NOTE — Telephone Encounter (Signed)
Noted

## 2020-01-17 NOTE — Progress Notes (Signed)
_0  ID: Kimberly Roy, female    DOB: 1931-04-26, 86 y.o.   MRN: [id number]  Chief Complaint  Patient presents with  . Follow-up    Dyspnea    Referring provider: Einar Pheasant, MD  HPI: 86 year old female never smoker seen for pulmonary consult December 10, 2019 for progressive dyspnea since developing COVID-19 in January 2021.  TEST/EVENTS :   01/17/2020 Follow up : Dyspnea  Patient returns for a 1 month follow-up.  Patient was seen last visit for a pulmonary consult for ongoing shortness of breath since developing COVID-19 in January 2021.  Patient has been on chronic oxygen 2 L .At bedtime  for many years.  She complained of increased shortness of breath with activity along with intermittent cough and wheezing. She was set up for a high-resolution CT chest that showed no significant adenopathy, mosaic pulmonary parenchymal attenuation with air trapping, small scattered pulmonary nodules in the right lower lobe considered unchanged.  Central bronchiectasis, negative for interstitial lung disease.  Mild congestive heart failure.Marland Kitchen PFTs today showed no airflow obstruction or restriction FEV1 92%, ratio 83, FVC 81%, no significant bronchodilator response, DLCO slightly decreased at 71% (corrected) Patient was set up for an overnight oximetry test that showed minimum desaturations felt not to need nocturnal oxygen at this time.Marland Kitchen However patient clarified today that she took the test with oxygen 2l/m on as she always does.  She was started on Symbicort, says feels it may have helped some w/ less dyspnea.   Daughter was very sick with Covid as well, Her husband died during this time.   Follows with Cardiology in [place]    Not very active due chronic back pain and previous back surgeries.   Allergies  Allergen Reactions  . Lyrica [Pregabalin] Swelling  . Omnicef [Cefdinir] Diarrhea and Nausea And Vomiting  . Atarax [Hydroxyzine]     jittery  . Dicyclomine Other (See Comments)     Abdominal bloating   . Hydroxyzine Hcl     jittery  . Levaquin [Levofloxacin] Swelling  . Nucynta Er [Tapentadol Hcl Er] Other (See Comments)    Severe constipation   . Oxybutynin Other (See Comments)    Blurred vision  . Zoloft [Sertraline Hcl]     Severe headache  . Biaxin [Clarithromycin] Other (See Comments) and Rash    Pt does not remember Pt does not remember  . Sertraline Nausea And Vomiting    Severe headache Severe headache Other reaction(s): Headache Severe headache  . Sulfa Antibiotics Rash    Pt does not remember  . Sulfasalazine Rash    Pt does not remember  . Tape Rash    [place] - redness  . Tapentadol Other (See Comments) and Rash    _1     Immunization History  Administered Date(s) Administered  . Influenza Split 07/13/2009, 07/03/2012  . Influenza, High Dose Seasonal PF 05/13/2016, 06/11/2017, 06/05/2018, 06/07/2019  . Influenza,inj,Quad PF,6+ Mos 05/25/2013, 05/31/2014, 04/27/2015  . PFIZER SARS-COV-2 Vaccination 12/16/2019  . Pneumococcal Conjugate-13 11/29/2014  . Pneumococcal-Unspecified 06/22/2001  . Tdap 09/17/2017  . Zoster Recombinat (Shingrix) 06/07/2019    Past Medical History:  Diagnosis Date  . Anemia   . Anxiety   . Chest pain   . CHF (congestive heart failure) ([place])   . Constipation   . DDD (degenerative disc disease), cervical   . Depression   . DVT (deep venous thrombosis) ([place])   . Dysphonia   . Dyspnea   .  Fatty liver   . Fatty liver   . Headache   . Hyperlipidemia   . Hyperpiesia   . Hypertension   . Hypothyroidism   . Interstitial cystitis   . Left ventricular dysfunction   . Lymphedema   . Nephrolithiasis   . Obstructive sleep apnea   . Osteoarthritis    knees/cervical and lumbar spine  . Pulmonary hypertension ([place])   . Pulmonary nodules    followed by Dr Raul Del  . Pure hypercholesterolemia   . Renal cyst     right    Tobacco History: Social History   Tobacco Use  Smoking Status Never Smoker  Smokeless Tobacco Never Used   Counseling given: Not Answered   Outpatient Medications Prior to Visit  Medication Sig Dispense Refill  . albuterol (VENTOLIN HFA) 108 (90 Base) MCG/ACT inhaler INHALE 2 PUFFS INTO THE LUNGS EVERY 6 (SIX) HOURS AS NEEDED FOR WHEEZING OR SHORTNESS OF BREATH 18 g 0  . aluminum-magnesium hydroxide-simethicone (MAALOX) 216-244-69 MG/5ML SUSP Take 15 mLs by mouth 4 (four) times daily -  before meals and at bedtime.     Marland Kitchen amLODipine (NORVASC) 10 MG tablet TAKE 1 TABLET BY MOUTH DAILY 90 tablet 1  . atorvastatin (LIPITOR) 20 MG tablet TAKE 1 TABLET BY MOUTH AT BEDTIME 90 tablet 1  . azelastine (ASTELIN) 0.1 % nasal spray Place 1 spray into both nostrils 2 (two) times daily. Use in each nostril as directed 30 mL 1  . benazepril (LOTENSIN) 40 MG tablet TAKE 1 TABLET BY MOUTH DAILY 90 tablet 1  . budesonide-formoterol (SYMBICORT) 160-4.5 MCG/ACT inhaler Inhale 2 puffs into the lungs 2 (two) times daily. 1 Inhaler 6  . busPIRone (BUSPAR) 10 MG tablet Take 10 mg by mouth 2 (two) times daily.     . calcium citrate-vitamin D (CITRACAL+D) 315-200 MG-UNIT tablet Take 1 tablet by mouth daily.    [name] Sodium 30-100 MG CAPS Take 1 capsule by mouth daily.     . Cholecalciferol (VITAMIN D3) 1000 UNITS CAPS Take by mouth.    . Diclofenac Sodium 3 % GEL Apply 1 g topically 2 (two) times daily as needed. 60 g 0  . DULoxetine (CYMBALTA) 60 MG capsule TAKE 1 CAPSULE BY MOUTH EVERY DAY. 30 capsule 3  . furosemide (LASIX) 20 MG tablet TAKE 1 TABLET BY MOUTH DAILY AS NEEDED 90 tablet 0  . gabapentin (NEURONTIN) 300 MG capsule Take 2 capsules (600 mg total) by mouth 3 (three) times daily. 540 capsule 2  . hydrochlorothiazide (MICROZIDE) 12.5 MG capsule Take 12.5 mg by mouth daily.    Marland Kitchen HYDROcodone-acetaminophen (NORCO) 10-325 MG tablet Take 1 tablet by mouth 3 (three) times  daily. 10 tablet 0  . magnesium oxide (MAG-OX) 400 MG tablet Take 1 tablet (400 mg total) by mouth daily. 30 tablet 1  . metoprolol succinate (TOPROL-XL) 100 MG 24 hr tablet Take 100 mg by mouth 2 (two) times daily.    Marland Kitchen morphine (MS CONTIN) 60 MG 12 hr tablet Take 1 tablet (60 mg total) by mouth 2 (two) times daily. 10 tablet 0  . Naldemedine Tosylate (SYMPROIC) 0.2 MG TABS Take 0.2 mg by mouth daily.    Marland Kitchen nystatin (NYAMYC) powder APPLY TOPICALLY 2 TIMES DAILY 60 g 1  . nystatin cream (MYCOSTATIN) Apply 1 application topically daily as needed. 30 g 0  . polyethylene glycol powder (GLYCOLAX/MIRALAX) 17 GM/SCOOP powder MIX 17 GRAMS AS MARKED ON BOTTLE TOP IN 8 OUNCES OF WATER AND DRINK ONCE A  DAY AS DIRECTED. 527 g 0  . Probiotic Product (ALIGN PO) Take by mouth daily.    . RABEprazole (ACIPHEX) 20 MG tablet Take 1 tablet (20 mg total) by mouth 2 (two) times daily before a meal. 60 tablet 2  . senna (SENOKOT) 8.6 MG TABS tablet Take 1 tablet by mouth 2 (two) times daily.    . Simethicone (GAS-X PO) Take 2 tablets by mouth 2 (two) times daily.    . sodium chloride (OCEAN) 0.65 % nasal spray Place 1 spray into the nose as needed.    Marland Kitchen SYNTHROID 100 MCG tablet TAKE 1 TABLET BY MOUTH DAILY 90 tablet 3  . traZODone (DESYREL) 50 MG tablet Take 25-50 mg by mouth at bedtime as needed.    . triamcinolone cream (KENALOG) 0.1 %      No facility-administered medications prior to visit.     Review of Systems:   Constitutional:   No  weight loss, night sweats,  Fevers, chills,  +fatigue, or  lassitude.  HEENT:   No headaches,  Difficulty swallowing,  Tooth/dental problems, or  Sore throat,                No sneezing, itching, ear ache, nasal congestion, post nasal drip,   CV:  No chest pain,  Orthopnea, PND, swelling in lower extremities, anasarca, dizziness, palpitations, syncope.   GI  No heartburn, indigestion, abdominal pain, nausea, vomiting, diarrhea, change in bowel habits, loss of appetite,  bloody stools.   Resp:    No chest wall deformity  Skin: no rash or lesions.  GU: no dysuria, change in color of urine, no urgency or frequency.  No flank pain, no hematuria   MS:  No joint pain or swelling.  No decreased range of motion.  No back pain.    Physical Exam  BP 130/78 (BP Location: Left Arm, Cuff Size: Normal)   Pulse 94   Temp 98.1 F (36.7 C) (Temporal)   Ht _0  (1.575 m)   Wt 179 lb (81.2 kg)   SpO2 94%   BMI 32.74 kg/m   GEN: A/Ox3; pleasant , NAD, wheelchair, elderly    HEENT:  [place]/AT,   , NOSE-clear, THROAT-clear, no lesions, no postnasal drip or exudate noted.   NECK:  Supple w/ fair ROM; no JVD; normal carotid impulses w/o bruits; no thyromegaly or nodules palpated; no lymphadenopathy.    RESP  Clear  P & A; w/o, wheezes/ rales/ or rhonchi. no accessory muscle use, no dullness to percussion Kyphosis   CARD:  RRR, no m/r/g, 1-2 + peripheral edema, pulses intact, no cyanosis or clubbing.  GI:   Soft & nt; nml bowel sounds; no organomegaly or masses detected.   Musco: Warm bil, no deformities or joint swelling noted.   Neuro: alert, no focal deficits noted.    Skin: Warm, no lesions or rashes    Lab Results:    BNP No results found for: BNP  ProBNP No results found for: PROBNP  Imaging: CT Chest High Resolution  Result Date: 12/29/2019 CLINICAL DATA:  Shortness of breath on exertion, history of COVID infection in January 2021. Shortness of breath since. EXAM: CT CHEST WITHOUT CONTRAST TECHNIQUE: Multidetector CT imaging of the chest was performed following the standard protocol without intravenous contrast. High resolution imaging of the lungs, as well as inspiratory and expiratory imaging, was performed. COMPARISON:  07/15/2012. FINDINGS: Cardiovascular: Atherosclerotic calcification of the aorta, aortic valve and coronary arteries. Heart is enlarged. No pericardial effusion.  Mediastinum/Nodes: Mediastinal lymph nodes are not enlarged by  CT size criteria. No axillary adenopathy. Hilar regions are difficult to definitively evaluate without IV contrast. Esophagus is grossly unremarkable. Lungs/Pleura: Mosaic pulmonary parenchymal attenuation with air trapping on expiratory phase imaging. Pulmonary nodules measure up to 6 mm in the peripheral right lower lobe (3/99), unchanged and considered benign. Central bronchiectasis. Basilar septal thickening. Image quality in the lung bases is degraded by respiratory motion. No definitive subpleural reticulation, traction, architectural distortion or honeycombing. Small bilateral pleural effusions. Upper Abdomen: Visualized portion of the liver is unremarkable. 2.1 cm right adrenal myelolipoma, unchanged. Visualized portions of the left adrenal gland, kidneys, spleen, pancreas, stomach and bowel are grossly unremarkable. Musculoskeletal: Postoperative and degenerative changes in the spine. Spinal stimulator is in place. Bilateral shoulder are show arthritis. IMPRESSION: 1. No definitive evidence of interstitial lung disease. 2. Mild congestive heart failure. 3. Air trapping is indicative of small airways disease. 4. Scattered pulmonary nodules are similar to 07/15/2012 and considered benign. 5. Aortic atherosclerosis (ICD10-I70.0). Coronary artery calcification. Electronically Signed   By: Lorin Picket M.D.   On: 12/29/2019 14:46      PFT Results Latest Ref Rng & Units 01/17/2020  FVC-Pre L 1.71  FVC-Predicted Pre % 81  FVC-Post L 1.71  FVC-Predicted Post % 81  Pre FEV1/FVC % % 80  Post FEV1/FCV % % 83  FEV1-Pre L 1.36  FEV1-Predicted Pre % 88  FEV1-Post L 1.42  DLCO UNC% % 65  DLCO COR %Predicted % 100  TLC L 3.62  TLC % Predicted % 76  RV % Predicted % 81    No results found for: NITRICOXIDE      Assessment & Plan:   COVID-19 virus infection Developed COVID 19 in 09/2019 with long haul symptoms .  She has associated deconditioning. HRCT Chest shows no post covid fibrosis .   Continue to monitor symptoms   Pulmonary nodules Stable on CT chest since 2013 c/w benign etiology   Dyspnea Dyspnea suspect is multifactoral in nature with multiple co-morbidities - PFT shows normal lung function with decreased DLCO - May have component of Reactive Airway -Had perceived benefit from Symbicort. Good news is no post covid fibrosis /ILD on HRCT chest .  Would advance activity as tolerated.   Plan  Patient Instructions  Continue on Oxygen 2l/m At bedtime  .  Continue on Symbicort 2 puffs Twice daily, rinse after use.  Continue on follow up with Cardiology  Low salt diet  Follow up with Dr. Carlis Abbott in 4 months and As needed   Please contact office for sooner follow up if symptoms do not improve or worsen or seek emergency care      Chronic respiratory failure with hypoxia ([place]) Continue on O2 At bedtime   ONO was done on O2 at 2l/m showing no significant desats.      Rexene Edison, NP 01/17/2020

## 2020-01-17 NOTE — Telephone Encounter (Signed)
I have ordered [employer] wheels for patient and son since he is in the home and also handicapped.

## 2020-01-17 NOTE — Patient Instructions (Signed)
Continue on Oxygen 2l/m At bedtime  .  Continue on Symbicort 2 puffs Twice daily, rinse after use.  Continue on follow up with Cardiology  Low salt diet  Follow up with Dr. Carlis Abbott in 4 months and As needed   Please contact office for sooner follow up if symptoms do not improve or worsen or seek emergency care

## 2020-01-18 DIAGNOSIS — N3010 Interstitial cystitis (chronic) without hematuria: Secondary | ICD-10-CM | POA: Diagnosis not present

## 2020-01-22 ENCOUNTER — Other Ambulatory Visit: Payer: Self-pay | Admitting: Internal Medicine

## 2020-01-24 ENCOUNTER — Other Ambulatory Visit: Payer: Self-pay | Admitting: Internal Medicine

## 2020-01-25 DIAGNOSIS — N3010 Interstitial cystitis (chronic) without hematuria: Secondary | ICD-10-CM | POA: Diagnosis not present

## 2020-01-27 DIAGNOSIS — N3010 Interstitial cystitis (chronic) without hematuria: Secondary | ICD-10-CM | POA: Diagnosis not present

## 2020-02-03 DIAGNOSIS — F330 Major depressive disorder, recurrent, mild: Secondary | ICD-10-CM | POA: Diagnosis not present

## 2020-02-03 DIAGNOSIS — F419 Anxiety disorder, unspecified: Secondary | ICD-10-CM | POA: Diagnosis not present

## 2020-02-03 DIAGNOSIS — F5105 Insomnia due to other mental disorder: Secondary | ICD-10-CM | POA: Diagnosis not present

## 2020-02-07 DIAGNOSIS — M25512 Pain in left shoulder: Secondary | ICD-10-CM | POA: Diagnosis not present

## 2020-02-08 DIAGNOSIS — N3010 Interstitial cystitis (chronic) without hematuria: Secondary | ICD-10-CM | POA: Diagnosis not present

## 2020-02-10 ENCOUNTER — Telehealth: Payer: Self-pay | Admitting: Internal Medicine

## 2020-02-10 NOTE — Telephone Encounter (Signed)
Pt would like a call back but only would say that it has to do with her son? Please advise

## 2020-02-11 ENCOUNTER — Other Ambulatory Visit: Payer: Self-pay | Admitting: Internal Medicine

## 2020-02-11 ENCOUNTER — Telehealth: Payer: Self-pay | Admitting: Internal Medicine

## 2020-02-11 NOTE — Telephone Encounter (Signed)
LMTCB

## 2020-02-11 NOTE — Telephone Encounter (Signed)
Pt returned call

## 2020-02-11 NOTE — Telephone Encounter (Signed)
See other note

## 2020-02-14 DIAGNOSIS — M19012 Primary osteoarthritis, left shoulder: Secondary | ICD-10-CM | POA: Diagnosis not present

## 2020-02-14 DIAGNOSIS — G8929 Other chronic pain: Secondary | ICD-10-CM | POA: Diagnosis not present

## 2020-02-14 DIAGNOSIS — M19011 Primary osteoarthritis, right shoulder: Secondary | ICD-10-CM | POA: Diagnosis not present

## 2020-02-14 DIAGNOSIS — M25511 Pain in right shoulder: Secondary | ICD-10-CM | POA: Diagnosis not present

## 2020-02-14 DIAGNOSIS — M25512 Pain in left shoulder: Secondary | ICD-10-CM | POA: Diagnosis not present

## 2020-02-15 DIAGNOSIS — N3010 Interstitial cystitis (chronic) without hematuria: Secondary | ICD-10-CM | POA: Diagnosis not present

## 2020-02-17 DIAGNOSIS — N3010 Interstitial cystitis (chronic) without hematuria: Secondary | ICD-10-CM | POA: Diagnosis not present

## 2020-02-21 DIAGNOSIS — R079 Chest pain, unspecified: Secondary | ICD-10-CM | POA: Diagnosis not present

## 2020-02-21 DIAGNOSIS — I10 Essential (primary) hypertension: Secondary | ICD-10-CM | POA: Diagnosis not present

## 2020-02-21 DIAGNOSIS — I890 Lymphedema, not elsewhere classified: Secondary | ICD-10-CM | POA: Diagnosis not present

## 2020-02-21 DIAGNOSIS — Z01810 Encounter for preprocedural cardiovascular examination: Secondary | ICD-10-CM | POA: Diagnosis not present

## 2020-02-21 DIAGNOSIS — I272 Pulmonary hypertension, unspecified: Secondary | ICD-10-CM | POA: Diagnosis not present

## 2020-02-21 DIAGNOSIS — E7800 Pure hypercholesterolemia, unspecified: Secondary | ICD-10-CM | POA: Diagnosis not present

## 2020-02-21 DIAGNOSIS — Z9889 Other specified postprocedural states: Secondary | ICD-10-CM | POA: Diagnosis not present

## 2020-02-21 DIAGNOSIS — I5032 Chronic diastolic (congestive) heart failure: Secondary | ICD-10-CM | POA: Diagnosis not present

## 2020-02-22 DIAGNOSIS — N3010 Interstitial cystitis (chronic) without hematuria: Secondary | ICD-10-CM | POA: Diagnosis not present

## 2020-02-23 ENCOUNTER — Ambulatory Visit
Admission: RE | Admit: 2020-02-23 | Discharge: 2020-02-23 | Disposition: A | Discharge status: Home / Self Care | Payer: PPO | Source: Ambulatory Visit | Attending: Internal Medicine | Admitting: Internal Medicine

## 2020-02-23 DIAGNOSIS — Z1231 Encounter for screening mammogram for malignant neoplasm of breast: Secondary | ICD-10-CM | POA: Diagnosis not present

## 2020-02-24 DIAGNOSIS — N3010 Interstitial cystitis (chronic) without hematuria: Secondary | ICD-10-CM | POA: Diagnosis not present

## 2020-02-29 DIAGNOSIS — N3010 Interstitial cystitis (chronic) without hematuria: Secondary | ICD-10-CM | POA: Diagnosis not present

## 2020-03-02 DIAGNOSIS — M3501 Sicca syndrome with keratoconjunctivitis: Secondary | ICD-10-CM | POA: Diagnosis not present

## 2020-03-02 DIAGNOSIS — N3010 Interstitial cystitis (chronic) without hematuria: Secondary | ICD-10-CM | POA: Diagnosis not present

## 2020-03-06 ENCOUNTER — Other Ambulatory Visit: Payer: Self-pay | Admitting: Internal Medicine

## 2020-03-13 DIAGNOSIS — Z79899 Other long term (current) drug therapy: Secondary | ICD-10-CM | POA: Diagnosis not present

## 2020-03-13 DIAGNOSIS — Q675 Congenital deformity of spine: Secondary | ICD-10-CM | POA: Diagnosis not present

## 2020-03-13 DIAGNOSIS — Z79891 Long term (current) use of opiate analgesic: Secondary | ICD-10-CM | POA: Diagnosis not present

## 2020-03-13 DIAGNOSIS — S76312D Strain of muscle, fascia and tendon of the posterior muscle group at thigh level, left thigh, subsequent encounter: Secondary | ICD-10-CM | POA: Diagnosis not present

## 2020-03-13 DIAGNOSIS — K5903 Drug induced constipation: Secondary | ICD-10-CM | POA: Diagnosis not present

## 2020-03-13 DIAGNOSIS — M48061 Spinal stenosis, lumbar region without neurogenic claudication: Secondary | ICD-10-CM | POA: Diagnosis not present

## 2020-03-13 DIAGNOSIS — M519 Unspecified thoracic, thoracolumbar and lumbosacral intervertebral disc disorder: Secondary | ICD-10-CM | POA: Diagnosis not present

## 2020-03-13 DIAGNOSIS — Z5181 Encounter for therapeutic drug level monitoring: Secondary | ICD-10-CM | POA: Diagnosis not present

## 2020-03-13 DIAGNOSIS — M5416 Radiculopathy, lumbar region: Secondary | ICD-10-CM | POA: Diagnosis not present

## 2020-03-23 ENCOUNTER — Other Ambulatory Visit: Payer: Self-pay | Admitting: Internal Medicine

## 2020-03-30 ENCOUNTER — Telehealth: Payer: Self-pay | Admitting: Internal Medicine

## 2020-03-30 MED ORDER — FLUTICASONE PROPIONATE 50 MCG/ACT NA SUSP: 2.0000 | Freq: Every day | NASAL | 1 refills | Status: DC

## 2020-03-30 NOTE — Telephone Encounter (Signed)
Pt needs a refill on Flonase sent to [facility].

## 2020-03-31 ENCOUNTER — Other Ambulatory Visit: Payer: Self-pay | Admitting: Internal Medicine

## 2020-04-07 ENCOUNTER — Ambulatory Visit: Payer: PPO | Admitting: Internal Medicine

## 2020-04-07 ENCOUNTER — Ambulatory Visit (INDEPENDENT_AMBULATORY_CARE_PROVIDER_SITE_OTHER): Payer: PPO | Admitting: Internal Medicine

## 2020-04-07 ENCOUNTER — Other Ambulatory Visit: Payer: Self-pay

## 2020-04-07 DIAGNOSIS — F439 Reaction to severe stress, unspecified: Secondary | ICD-10-CM | POA: Diagnosis not present

## 2020-04-07 DIAGNOSIS — L299 Pruritus, unspecified: Secondary | ICD-10-CM | POA: Diagnosis not present

## 2020-04-07 DIAGNOSIS — I10 Essential (primary) hypertension: Secondary | ICD-10-CM | POA: Diagnosis not present

## 2020-04-07 DIAGNOSIS — E039 Hypothyroidism, unspecified: Secondary | ICD-10-CM | POA: Diagnosis not present

## 2020-04-07 DIAGNOSIS — K219 Gastro-esophageal reflux disease without esophagitis: Secondary | ICD-10-CM

## 2020-04-07 DIAGNOSIS — E785 Hyperlipidemia, unspecified: Secondary | ICD-10-CM

## 2020-04-07 DIAGNOSIS — D649 Anemia, unspecified: Secondary | ICD-10-CM

## 2020-04-07 DIAGNOSIS — I272 Pulmonary hypertension, unspecified: Secondary | ICD-10-CM

## 2020-04-07 DIAGNOSIS — I890 Lymphedema, not elsewhere classified: Secondary | ICD-10-CM | POA: Diagnosis not present

## 2020-04-07 NOTE — Progress Notes (Signed)
Patient ID: Kimberly Roy, female   DOB: May 11, 1931, 86 y.o.   MRN: [id number]   Subjective:    Patient ID: Kimberly Roy, female    DOB: [birth year], 86 y.o.   MRN: [id number]  HPI This visit occurred during the SARS-CoV-2 public health emergency.  Safety protocols were in place, including screening questions prior to the visit, additional usage of staff PPE, and extensive cleaning of exam room while observing appropriate contact time as indicated for disinfecting solutions.  Patient here for a scheduled follow up.  She is accompanied by her caretaker and her son.  History obtained from pt and caretaker.  She reports she is doing relatively well.  Breathing is better.  No increased cough or congestion.  No chest pain.  Eating.  Getting around with her walker.  No acid reflux.  No increased abdominal pain.  Abdominal discomfort much improved.  Bowels moving.  She does report increased itching.  Has lesions (that look like bites) - appear, itch and resolve.  Multiple areas.  No actual rash.  No one else in the house affected.  Has had [facility] come out and assess.  No bed bugs, etc.  Has used various steroid creams.  Request dermatology referral.  Seeing urology - Dr Amalia Hailey.  Bladder symptoms are better.  States blood pressure - systolic readings mostly averaging 145-155.  Overall feeling better.  Discussed increased stress.     Past Medical History:  Diagnosis Date  . Anemia   . Anxiety   . Chest pain   . CHF (congestive heart failure) ([place])   . Constipation   . DDD (degenerative disc disease), cervical   . Depression   . DVT (deep venous thrombosis) ([place])   . Dysphonia   . Dyspnea   . Fatty liver   . Fatty liver   . Headache   . Hyperlipidemia   . Hyperpiesia   . Hypertension   . Hypothyroidism   . Interstitial cystitis   . Left ventricular dysfunction   . Lymphedema   . Nephrolithiasis   . Obstructive sleep apnea   . Osteoarthritis    knees/cervical and lumbar spine  . Pulmonary  hypertension ([place])   . Pulmonary nodules    followed by Dr Raul Del  . Pure hypercholesterolemia   . Renal cyst    right   Past Surgical History:  Procedure Laterality Date  . ABDOMINAL HYSTERECTOMY     ovaries left in place  . APPENDECTOMY    . Back Surgeries    . BACK SURGERY    . BREAST REDUCTION SURGERY     3/99  . CARDIAC CATHETERIZATION    . cataracts Bilateral   . CERVICAL SPINE SURGERY    . ESOPHAGEAL MANOMETRY N/A 08/02/2015   Procedure: ESOPHAGEAL MANOMETRY (EM);  Surgeon: Josefine Class, MD;  Location: [facility] ENDOSCOPY;  Service: Endoscopy;  Laterality: N/A;  . ESOPHAGOGASTRODUODENOSCOPY N/A 02/27/2015   Procedure: ESOPHAGOGASTRODUODENOSCOPY (EGD);  Surgeon: Hulen Luster, MD;  Location: [facility] ENDOSCOPY;  Service: Gastroenterology;  Laterality: N/A;  . ESOPHAGOGASTRODUODENOSCOPY (EGD) WITH PROPOFOL N/A 10/30/2018   Procedure: ESOPHAGOGASTRODUODENOSCOPY (EGD) WITH PROPOFOL;  Surgeon: Lollie Sails, MD;  Location: [facility] ENDOSCOPY;  Service: Endoscopy;  Laterality: N/A;  . EXCISIONAL HEMORRHOIDECTOMY    . EYE SURGERY    . FRACTURE SURGERY    . HEMORRHOID SURGERY    . HIP SURGERY  2013   Right hip surgery  . JOINT REPLACEMENT    . KNEE ARTHROSCOPY  left and right  . ORIF FEMUR FRACTURE Right 08/07/2018   Procedure: OPEN REDUCTION INTERNAL FIXATION (ORIF) DISTAL FEMUR FRACTURE;  Surgeon: Hessie Knows, MD;  Location: [facility] ORS;  Service: Orthopedics;  Laterality: Right;  . REDUCTION MAMMAPLASTY Bilateral YRS AGO  . REPLACEMENT TOTAL KNEE Bilateral   . rotator cuff surgery     blilateral  . TONSILECTOMY/ADENOIDECTOMY WITH MYRINGOTOMY    . VISCERAL ARTERY INTERVENTION N/A 02/25/2019   Procedure: VISCERAL ARTERY INTERVENTION;  Surgeon: Algernon Huxley, MD;  Location: [facility] LAB;  Service: Cardiovascular;  Laterality: N/A;   Family History  Problem Relation Age of Onset  . Heart disease Mother   . Stroke Mother   . Hypertension Mother   . Heart disease Father         myocardial infarction age 18  . Breast cancer Neg Hx    Social History   Socioeconomic History  . Marital status: Widowed    Spouse name: Not on file  . Number of children: 2  . Years of education: Not on file  . Highest education level: Not on file  Occupational History    Comment: insurance agency  Tobacco Use  . Smoking status: Never Smoker  . Smokeless tobacco: Never Used  Vaping Use  . Vaping Use: Never used  Substance and Sexual Activity  . Alcohol use: No    Alcohol/week: 0.0 standard drinks  . Drug use: Never  . Sexual activity: Not Currently  Other Topics Concern  . Not on file  Social History Narrative  . Not on file   Social Determinants of Health   Financial Resource Strain:   . Difficulty of Paying Living Expenses:   Food Insecurity:   . Worried About Charity fundraiser in the Last Year:   . Arboriculturist in the Last Year:   Transportation Needs:   . Film/video editor (Medical):   Marland Kitchen Lack of Transportation (Non-Medical):   Physical Activity:   . Days of Exercise per Week:   . Minutes of Exercise per Session:   Stress:   . Feeling of Stress :   Social Connections:   . Frequency of Communication with Friends and Family:   . Frequency of Social Gatherings with Friends and Family:   . Attends Religious Services:   . Active Member of Clubs or Organizations:   . Attends Archivist Meetings:   Marland Kitchen Marital Status:     Outpatient Encounter Medications as of 04/07/2020  Medication Sig  . albuterol (VENTOLIN HFA) 108 (90 Base) MCG/ACT inhaler INHALE 2 PUFFS INTO THE LUNGS EVERY 6 (SIX) HOURS AS NEEDED FOR WHEEZING OR SHORTNESS OF BREATH  . aluminum-magnesium hydroxide-simethicone (MAALOX) 865-784-69 MG/5ML SUSP Take 15 mLs by mouth 4 (four) times daily -  before meals and at bedtime.   Marland Kitchen amLODipine (NORVASC) 10 MG tablet TAKE 1 TABLET BY MOUTH DAILY  . atorvastatin (LIPITOR) 20 MG tablet TAKE 1 TABLET BY MOUTH AT BEDTIME  . azelastine  (ASTELIN) 0.1 % nasal spray PLACE 1 SPRAY INTO BOTH NOSTRILS TWO TIMES DAILY  . benazepril (LOTENSIN) 40 MG tablet TAKE 1 TABLET BY MOUTH DAILY  . budesonide-formoterol (SYMBICORT) 160-4.5 MCG/ACT inhaler Inhale 2 puffs into the lungs 2 (two) times daily.  . busPIRone (BUSPAR) 10 MG tablet Take 10 mg by mouth 2 (two) times daily.   . calcium citrate-vitamin D (CITRACAL+D) 315-200 MG-UNIT tablet Take 1 tablet by mouth daily.  . Cholecalciferol (VITAMIN D3) 1000 UNITS CAPS Take  by mouth.  . Diclofenac Sodium 3 % GEL Apply 1 g topically 2 (two) times daily as needed.  . DULoxetine (CYMBALTA) 60 MG capsule TAKE 1 CAPSULE BY MOUTH EVERY DAY.  . fluticasone (FLONASE) 50 MCG/ACT nasal spray Place 2 sprays into both nostrils daily.  . furosemide (LASIX) 20 MG tablet TAKE 1 TABLET BY MOUTH EVERY DAY AS NEEDED  . gabapentin (NEURONTIN) 300 MG capsule Take 2 capsules (600 mg total) by mouth 3 (three) times daily.  . hydrochlorothiazide (MICROZIDE) 12.5 MG capsule Take 12.5 mg by mouth daily.  Marland Kitchen HYDROcodone-acetaminophen (NORCO) 10-325 MG tablet Take 1 tablet by mouth 3 (three) times daily.  . magnesium oxide (MAG-OX) 400 MG tablet Take 1 tablet (400 mg total) by mouth daily.  . metoprolol succinate (TOPROL-XL) 100 MG 24 hr tablet Take 100 mg by mouth 2 (two) times daily.  Marland Kitchen morphine (MS CONTIN) 60 MG 12 hr tablet Take 1 tablet (60 mg total) by mouth 2 (two) times daily.  Melynda Ripple Tosylate (SYMPROIC) 0.2 MG TABS Take 0.2 mg by mouth daily.  Marland Kitchen nystatin (NYAMYC) powder APPLY TOPICALLY 2 TIMES DAILY  . nystatin cream (MYCOSTATIN) Apply 1 application topically daily as needed.  . polyethylene glycol powder (GLYCOLAX/MIRALAX) 17 GM/SCOOP powder MIX 17 GRAMS AS MARKED ON BOTTLE TOP IN 8 OUNCES OF WATER AND DRINK ONCE A DAY AS DIRECTED.  . Probiotic Product (ALIGN PO) Take by mouth daily.  . RABEprazole (ACIPHEX) 20 MG tablet TAKE 1 TABLET BY MOUTH 2 TIMES DAILY BEFORE A MEAL  . senna (SENOKOT) 8.6 MG TABS  tablet Take 1 tablet by mouth 2 (two) times daily.  . Simethicone (GAS-X PO) Take 2 tablets by mouth 2 (two) times daily.  . sodium chloride (OCEAN) 0.65 % nasal spray Place 1 spray into the nose as needed.  Marland Kitchen SYNTHROID 100 MCG tablet TAKE 1 TABLET BY MOUTH DAILY  . traZODone (DESYREL) 50 MG tablet Take 25-50 mg by mouth at bedtime as needed.  . triamcinolone cream (KENALOG) 0.1 %   . [DISCONTINUED] Casanthranol-Docusate Sodium 30-100 MG CAPS Take 1 capsule by mouth daily.    No facility-administered encounter medications on file as of 04/07/2020.    Review of Systems  Constitutional: Negative for appetite change and unexpected weight change.  HENT: Negative for congestion and sinus pressure.   Respiratory: Negative for cough, chest tightness and shortness of breath.   Cardiovascular: Negative for chest pain and palpitations.       Leg swelling - improved.   Gastrointestinal: Negative for abdominal pain, diarrhea, nausea and vomiting.  Genitourinary: Negative for difficulty urinating and dysuria.  Musculoskeletal: Negative for joint swelling and myalgias.  Skin:       itching as outlined.  Skin lesions - bites.  No diffuse rash.    Neurological: Negative for dizziness, light-headedness and headaches.  Psychiatric/Behavioral: Negative for agitation and dysphoric mood.       Objective:    Physical Exam Vitals reviewed.  Constitutional:      General: She is not in acute distress.    Appearance: Normal appearance.  HENT:     Head: Normocephalic and atraumatic.     Right Ear: External ear normal.     Left Ear: External ear normal.  Eyes:     General: No scleral icterus.       Right eye: No discharge.        Left eye: No discharge.     Conjunctiva/sclera: Conjunctivae normal.  Neck:  Thyroid: No thyromegaly.  Cardiovascular:     Rate and Rhythm: Normal rate and regular rhythm.  Pulmonary:     Effort: No respiratory distress.     Breath sounds: Normal breath sounds. No  wheezing.  Abdominal:     General: Bowel sounds are normal.     Palpations: Abdomen is soft.     Tenderness: There is no abdominal tenderness.  Musculoskeletal:        General: No tenderness.     Cervical back: Neck supple. No tenderness.     Comments: Lower extremity swelling - improved.  Some venostasis changes - lower leg.    Lymphadenopathy:     Cervical: No cervical adenopathy.  Skin:    Findings: No rash.     Comments: Skin lesions - small circular - appear to be c/w bites.   Neurological:     Mental Status: She is alert.  Psychiatric:        Mood and Affect: Mood normal.        Behavior: Behavior normal.     BP 132/68   Pulse 78   Temp 97.6 F (36.4 C) (Oral)   Resp 16   Ht _0  (1.575 m)   Wt 177 lb (80.3 kg)   SpO2 96%   BMI 32.37 kg/m  Wt Readings from Last 3 Encounters:  04/07/20 177 lb (80.3 kg)  01/17/20 179 lb (81.2 kg)  01/05/20 179 lb (81.2 kg)     Lab Results  Component Value Date   WBC 8.8 01/05/2020   HGB 10.8 (L) 01/05/2020   HCT 32.6 (L) 01/05/2020   PLT 185.0 01/05/2020   GLUCOSE 98 01/05/2020   CHOL 144 01/05/2020   TRIG 104.0 01/05/2020   HDL 53.30 01/05/2020   LDLCALC 70 01/05/2020   ALT 7 01/05/2020   AST 16 01/05/2020   NA 142 01/05/2020   K 4.9 01/05/2020   CL 103 01/05/2020   CREATININE 0.77 01/05/2020   BUN 16 01/05/2020   CO2 32 01/05/2020   TSH 1.01 01/05/2020   INR 0.89 08/06/2018   HGBA1C 6.0 10/19/2014    MM 3D SCREEN BREAST BILATERAL  Result Date: 02/25/2020 CLINICAL DATA:  Screening. EXAM: DIGITAL SCREENING BILATERAL MAMMOGRAM WITH TOMO AND CAD COMPARISON:  Previous exam(s). ACR Breast Density Category b: There are scattered areas of fibroglandular density. FINDINGS: There are no findings suspicious for malignancy. Images were processed with CAD. IMPRESSION: No mammographic evidence of malignancy. A result letter of this screening mammogram will be mailed directly to the patient. RECOMMENDATION: Screening mammogram  in one year. (Code:SM-B-01Y) BI-RADS CATEGORY  1: Negative. Electronically Signed   By: Kristopher Oppenheim M.D.   On: 02/25/2020 13:26       Assessment & Plan:   Problem List Items Addressed This Visit    Anemia    Follow cbc. Evaluated by hematology.       Relevant Orders   CBC with Differential/Platelet   IBC + Ferritin   GERD (gastroesophageal reflux disease)    Controlled. Aciphex.        Hyperlipidemia    Low cholesterol diet and exercise.  On lipitor.  Follow lipid panel and liver function tests.        Relevant Orders   Hepatic function panel   Lipid panel   Hypertension    Blood pressure elevated on initial check.  Outside checks as outlined.  My check today ok.  Continue current medication regimen - hctz, benazepril and amlodipine.  Follow pressures.  Follow metabolic panel.        Relevant Orders   Basic metabolic panel   Hypothyroidism    On thyroid replacement.  Follow tsh.       Itching    Itching as outlined.  Associated - "bites".  Varied lesions.  No one else in house affected.  Has tried varied steroid creams.  Request dermatology referral.        Relevant Orders   Ambulatory referral to Dermatology   Lymphedema    Lower extremity swelling improved.  Continue compression stockings.        Pulmonary hypertension (HCC)    Followed by pulmonary - has known sleep apnea.       Stress    Increased stress.  Discussed with her today.  Follow.            Einar Pheasant, MD

## 2020-04-08 ENCOUNTER — Encounter: Payer: Self-pay | Admitting: Internal Medicine

## 2020-04-08 DIAGNOSIS — L299 Pruritus, unspecified: Secondary | ICD-10-CM | POA: Insufficient documentation

## 2020-04-08 NOTE — Assessment & Plan Note (Addendum)
Follow cbc. Evaluated by hematology.

## 2020-04-08 NOTE — Assessment & Plan Note (Signed)
Lower extremity swelling improved.  Continue compression stockings.

## 2020-04-08 NOTE — Assessment & Plan Note (Signed)
Itching as outlined.  Associated - "bites".  Varied lesions.  No one else in house affected.  Has tried varied steroid creams.  Request dermatology referral.

## 2020-04-08 NOTE — Assessment & Plan Note (Signed)
Followed by pulmonary - has known sleep apnea.

## 2020-04-08 NOTE — Assessment & Plan Note (Signed)
Increased stress.  Discussed with her today.  Follow.

## 2020-04-08 NOTE — Assessment & Plan Note (Signed)
On thyroid replacement.  Follow tsh.  

## 2020-04-08 NOTE — Assessment & Plan Note (Signed)
Blood pressure elevated on initial check.  Outside checks as outlined.  My check today ok.  Continue current medication regimen - hctz, benazepril and amlodipine.  Follow pressures.  Follow metabolic panel.

## 2020-04-08 NOTE — Assessment & Plan Note (Signed)
Low cholesterol diet and exercise.  On lipitor.  Follow lipid panel and liver function tests.   

## 2020-04-08 NOTE — Assessment & Plan Note (Signed)
Controlled. Aciphex.

## 2020-04-25 DIAGNOSIS — N3010 Interstitial cystitis (chronic) without hematuria: Secondary | ICD-10-CM | POA: Diagnosis not present

## 2020-04-25 DIAGNOSIS — R399 Unspecified symptoms and signs involving the genitourinary system: Secondary | ICD-10-CM | POA: Diagnosis not present

## 2020-04-27 ENCOUNTER — Other Ambulatory Visit: Payer: Self-pay

## 2020-04-27 ENCOUNTER — Other Ambulatory Visit: Payer: Self-pay | Admitting: Internal Medicine

## 2020-04-27 ENCOUNTER — Other Ambulatory Visit (INDEPENDENT_AMBULATORY_CARE_PROVIDER_SITE_OTHER): Payer: PPO

## 2020-04-27 ENCOUNTER — Other Ambulatory Visit: Payer: PPO

## 2020-04-27 DIAGNOSIS — I10 Essential (primary) hypertension: Secondary | ICD-10-CM

## 2020-04-27 DIAGNOSIS — E785 Hyperlipidemia, unspecified: Secondary | ICD-10-CM | POA: Diagnosis not present

## 2020-04-27 DIAGNOSIS — D649 Anemia, unspecified: Secondary | ICD-10-CM | POA: Diagnosis not present

## 2020-04-27 LAB — CBC WITH DIFFERENTIAL/PLATELET
Basophils Absolute: 0 10*3/uL (ref 0.0–0.1)
Basophils Relative: 0.5 % (ref 0.0–3.0)
Eosinophils Absolute: 0.3 10*3/uL (ref 0.0–0.7)
Eosinophils Relative: 5.2 % — ABNORMAL HIGH (ref 0.0–5.0)
HCT: 32.4 % — ABNORMAL LOW (ref 36.0–46.0)
Hemoglobin: 10.6 g/dL — ABNORMAL LOW (ref 12.0–15.0)
Lymphocytes Relative: 30.8 % (ref 12.0–46.0)
Lymphs Abs: 2 10*3/uL (ref 0.7–4.0)
MCHC: 32.8 g/dL (ref 30.0–36.0)
MCV: 96.7 fl (ref 78.0–100.0)
Monocytes Absolute: 0.6 10*3/uL (ref 0.1–1.0)
Monocytes Relative: 8.5 % (ref 3.0–12.0)
Neutro Abs: 3.5 10*3/uL (ref 1.4–7.7)
Neutrophils Relative %: 55 % (ref 43.0–77.0)
Platelets: 152 10*3/uL (ref 150.0–400.0)
RBC: 3.35 Mil/uL — ABNORMAL LOW (ref 3.87–5.11)
RDW: 15.5 % (ref 11.5–15.5)
WBC: 6.4 10*3/uL (ref 4.0–10.5)

## 2020-04-27 LAB — IBC + FERRITIN
Ferritin: 27 ng/mL (ref 10.0–291.0)
Iron: 56 ug/dL (ref 42–145)
Saturation Ratios: 16.3 % — ABNORMAL LOW (ref 20.0–50.0)
Transferrin: 245 mg/dL (ref 212.0–360.0)

## 2020-04-27 LAB — BASIC METABOLIC PANEL
BUN: 23 mg/dL (ref 6–23)
CO2: 33 mEq/L — ABNORMAL HIGH (ref 19–32)
Calcium: 9.6 mg/dL (ref 8.4–10.5)
Chloride: 103 mEq/L (ref 96–112)
Creatinine, Ser: 0.98 mg/dL (ref 0.40–1.20)
GFR: 53.67 mL/min — ABNORMAL LOW (ref >60.00)
Glucose, Bld: 84 mg/dL (ref 70–99)
Potassium: 4.8 mEq/L (ref 3.5–5.1)
Sodium: 141 mEq/L (ref 135–145)

## 2020-04-27 LAB — HEPATIC FUNCTION PANEL
ALT: 8 U/L (ref 0–35)
AST: 14 U/L (ref 0–37)
Albumin: 3.9 g/dL (ref 3.5–5.2)
Alkaline Phosphatase: 96 U/L (ref 39–117)
Bilirubin, Direct: 0.1 mg/dL (ref 0.0–0.3)
Total Bilirubin: 0.4 mg/dL (ref 0.2–1.2)
Total Protein: 6.2 g/dL (ref 6.0–8.3)

## 2020-04-27 LAB — LIPID PANEL
Cholesterol: 123 mg/dL (ref 0–200)
HDL: 46.5 mg/dL (ref >39.00)
LDL Cholesterol: 57 mg/dL (ref 0–99)
NonHDL: 76.02
Total CHOL/HDL Ratio: 3
Triglycerides: 96 mg/dL (ref 0.0–149.0)
VLDL: 19.2 mg/dL (ref 0.0–40.0)

## 2020-05-04 DIAGNOSIS — L219 Seborrheic dermatitis, unspecified: Secondary | ICD-10-CM | POA: Diagnosis not present

## 2020-05-04 DIAGNOSIS — L282 Other prurigo: Secondary | ICD-10-CM | POA: Diagnosis not present

## 2020-05-04 DIAGNOSIS — L299 Pruritus, unspecified: Secondary | ICD-10-CM | POA: Diagnosis not present

## 2020-05-05 ENCOUNTER — Other Ambulatory Visit: Payer: Self-pay | Admitting: Internal Medicine

## 2020-05-16 ENCOUNTER — Telehealth: Payer: Self-pay | Admitting: Internal Medicine

## 2020-05-16 DIAGNOSIS — R200 Anesthesia of skin: Secondary | ICD-10-CM | POA: Diagnosis not present

## 2020-05-16 DIAGNOSIS — M25512 Pain in left shoulder: Secondary | ICD-10-CM | POA: Diagnosis not present

## 2020-05-16 DIAGNOSIS — M19011 Primary osteoarthritis, right shoulder: Secondary | ICD-10-CM | POA: Diagnosis not present

## 2020-05-16 DIAGNOSIS — G8929 Other chronic pain: Secondary | ICD-10-CM | POA: Diagnosis not present

## 2020-05-16 DIAGNOSIS — R202 Paresthesia of skin: Secondary | ICD-10-CM | POA: Diagnosis not present

## 2020-05-16 DIAGNOSIS — M25511 Pain in right shoulder: Secondary | ICD-10-CM | POA: Diagnosis not present

## 2020-05-16 DIAGNOSIS — M19012 Primary osteoarthritis, left shoulder: Secondary | ICD-10-CM | POA: Diagnosis not present

## 2020-05-16 NOTE — Telephone Encounter (Signed)
Patient called in about son allergy medication

## 2020-05-25 ENCOUNTER — Other Ambulatory Visit: Payer: Self-pay | Admitting: Internal Medicine

## 2020-05-26 NOTE — Telephone Encounter (Signed)
LMTCB

## 2020-05-29 NOTE — Telephone Encounter (Signed)
LMTCB

## 2020-05-31 ENCOUNTER — Other Ambulatory Visit: Payer: Self-pay

## 2020-05-31 ENCOUNTER — Ambulatory Visit (INDEPENDENT_AMBULATORY_CARE_PROVIDER_SITE_OTHER): Payer: PPO

## 2020-05-31 DIAGNOSIS — Z23 Encounter for immunization: Secondary | ICD-10-CM

## 2020-05-31 NOTE — Telephone Encounter (Signed)
Son is coming in for appt today.

## 2020-06-05 ENCOUNTER — Telehealth: Payer: Self-pay | Admitting: *Deleted

## 2020-06-05 DIAGNOSIS — S76312D Strain of muscle, fascia and tendon of the posterior muscle group at thigh level, left thigh, subsequent encounter: Secondary | ICD-10-CM | POA: Diagnosis not present

## 2020-06-05 DIAGNOSIS — K5903 Drug induced constipation: Secondary | ICD-10-CM | POA: Diagnosis not present

## 2020-06-05 DIAGNOSIS — D500 Iron deficiency anemia secondary to blood loss (chronic): Secondary | ICD-10-CM

## 2020-06-05 DIAGNOSIS — Q675 Congenital deformity of spine: Secondary | ICD-10-CM | POA: Diagnosis not present

## 2020-06-05 DIAGNOSIS — M5416 Radiculopathy, lumbar region: Secondary | ICD-10-CM | POA: Diagnosis not present

## 2020-06-05 DIAGNOSIS — Z79899 Other long term (current) drug therapy: Secondary | ICD-10-CM | POA: Diagnosis not present

## 2020-06-05 DIAGNOSIS — M48061 Spinal stenosis, lumbar region without neurogenic claudication: Secondary | ICD-10-CM | POA: Diagnosis not present

## 2020-06-05 DIAGNOSIS — M519 Unspecified thoracic, thoracolumbar and lumbosacral intervertebral disc disorder: Secondary | ICD-10-CM | POA: Diagnosis not present

## 2020-06-05 NOTE — Telephone Encounter (Signed)
Patient called to r/s her 6 month follow-up with Dr. Rogue Bussing. Per Dr. Sharmaine Base note: cbc, metc, ferr, and iron studies need to be ordered. Patient spoke with scheduler and she r/s her apts to mid - Oct.  Lab orders entered.

## 2020-06-07 ENCOUNTER — Encounter: Payer: Self-pay | Admitting: Internal Medicine

## 2020-06-07 ENCOUNTER — Telehealth: Payer: Self-pay | Admitting: Internal Medicine

## 2020-06-07 ENCOUNTER — Telehealth: Payer: Self-pay

## 2020-06-07 NOTE — Telephone Encounter (Signed)
Patient called in earlier to access nurse with weakness, etc. Patient was scheduled for a virtual with NP tomorrow at 4. Called patient to follow up. Weakness is a chronic condition for her. Confirmed she is doing ok. She is feeling weak and stated that overall she does not feel well and she is tired. No complaint of blurry vision at this time. Gave her info to the local urgent cares and advised that if her symptoms worsen, she needs to be evaluated tonight. Patient agreed to do so but still kept appt with NP and scheduled f/u with Dr Nicki Reaper next week.

## 2020-06-08 ENCOUNTER — Encounter: Payer: Self-pay | Admitting: Nurse Practitioner

## 2020-06-08 ENCOUNTER — Telehealth (INDEPENDENT_AMBULATORY_CARE_PROVIDER_SITE_OTHER): Payer: PPO | Admitting: Nurse Practitioner

## 2020-06-08 ENCOUNTER — Other Ambulatory Visit: Payer: Self-pay

## 2020-06-08 VITALS — BP 136/59 | HR 58 | Ht 62.0 in | Wt 171.0 lb

## 2020-06-08 DIAGNOSIS — R001 Bradycardia, unspecified: Secondary | ICD-10-CM | POA: Diagnosis not present

## 2020-06-08 DIAGNOSIS — R5383 Other fatigue: Secondary | ICD-10-CM

## 2020-06-08 NOTE — Patient Instructions (Addendum)
Your heart rate is 50-52-58 today at rest. This is not unusual for you. Your oxygen is 95% and BP 136/59. You had fatigue yesterday- better today.   Take half of  the metoprolol this evening. Check your heart rate periodically and record. Call us tomorrow morning with the pulse and BP.   You chart shows that your heart rate is always lower at home- when resting than in the office when you have walked into the office.

## 2020-06-08 NOTE — Telephone Encounter (Signed)
Ok to get pneumovax - if no acute respiratory symptoms, etc.

## 2020-06-08 NOTE — Progress Notes (Signed)
Virtual Visit via Telephone Note  This visit type was conducted due to national recommendations for restrictions regarding the COVID-19 pandemic (e.g. social distancing).  This format is felt to be most appropriate for this patient at this time.  All issues noted in this document were discussed and addressed.  No physical exam was performed (except for noted visual exam findings with Video Visits).   I connected with@ on 06/08/20 at  4:00 PM EDT by a video enabled telemedicine application or telephone and verified that I am speaking with the correct person using two identifiers. Location patient: home Location provider: work or home office Persons participating in the virtual visit: patient, provider  I discussed the limitations, risks, security and privacy concerns of performing an evaluation and management service by telephone and the availability of in person appointments. I also discussed with the patient that there may be a patient responsible charge related to this service. The patient expressed understanding and agreed to proceed.  Interactive audio and video telecommunications were attempted between this provider and patient, however failed, due to patient did not have access to video capability.  We continued and completed visit with audio only.   Reason for visit: weak and had low pulse on Monday feeling bette now  HPI: This 86 yo reports that she has been doing extra work since her home assistant was not able to come last weekend. Patient currently has been taking care of herself and her son and has been exhausted. She reports that she is not used to doing this much work. She has unusual weakness, and fatigue and thinks she overexerted herself yesterday. She started checking her heart rate and found that it was in the 50s. She has been on stable dose of metoprolol for many years and does get  bradycardia at rest and has been asymptomatic.   She has had no chest pain, pressure, heaviness  or tightness. No shortness of breath or DOE. She decided not to do anything today and rest. She is feeling better-back to normal. She sees Dr. Nicki Reaper on Monday. She sees no reason to go to Urgent Care.   Pulse Readings from Last 3 Encounters:  06/08/20 (!) 58  04/07/20 78  01/17/20 94     ROS: See pertinent positives and negatives per HPI.  Past Medical History:  Diagnosis Date  . Anemia   . Anxiety   . Chest pain   . CHF (congestive heart failure) ([place])   . Constipation   . DDD (degenerative disc disease), cervical   . Depression   . DVT (deep venous thrombosis) ([place])   . Dysphonia   . Dyspnea   . Fatty liver   . Fatty liver   . Headache   . Hyperlipidemia   . Hyperpiesia   . Hypertension   . Hypothyroidism   . Interstitial cystitis   . Left ventricular dysfunction   . Lymphedema   . Nephrolithiasis   . Obstructive sleep apnea   . Osteoarthritis    knees/cervical and lumbar spine  . Pulmonary hypertension ([place])   . Pulmonary nodules    followed by Dr Raul Del  . Pure hypercholesterolemia   . Renal cyst    right    Past Surgical History:  Procedure Laterality Date  . ABDOMINAL HYSTERECTOMY     ovaries left in place  . APPENDECTOMY    . Back Surgeries    . BACK SURGERY    . BREAST REDUCTION SURGERY     3/99  .  CARDIAC CATHETERIZATION    . cataracts Bilateral   . CERVICAL SPINE SURGERY    . ESOPHAGEAL MANOMETRY N/A 08/02/2015   Procedure: ESOPHAGEAL MANOMETRY (EM);  Surgeon: Josefine Class, MD;  Location: [facility] ENDOSCOPY;  Service: Endoscopy;  Laterality: N/A;  . ESOPHAGOGASTRODUODENOSCOPY N/A 02/27/2015   Procedure: ESOPHAGOGASTRODUODENOSCOPY (EGD);  Surgeon: Hulen Luster, MD;  Location: [facility] ENDOSCOPY;  Service: Gastroenterology;  Laterality: N/A;  . ESOPHAGOGASTRODUODENOSCOPY (EGD) WITH PROPOFOL N/A 10/30/2018   Procedure: ESOPHAGOGASTRODUODENOSCOPY (EGD) WITH PROPOFOL;  Surgeon: Lollie Sails, MD;  Location: [facility] ENDOSCOPY;  Service: Endoscopy;   Laterality: N/A;  . EXCISIONAL HEMORRHOIDECTOMY    . EYE SURGERY    . FRACTURE SURGERY    . HEMORRHOID SURGERY    . HIP SURGERY  2013   Right hip surgery  . JOINT REPLACEMENT    . KNEE ARTHROSCOPY     left and right  . ORIF FEMUR FRACTURE Right 08/07/2018   Procedure: OPEN REDUCTION INTERNAL FIXATION (ORIF) DISTAL FEMUR FRACTURE;  Surgeon: Hessie Knows, MD;  Location: [facility] ORS;  Service: Orthopedics;  Laterality: Right;  . REDUCTION MAMMAPLASTY Bilateral YRS AGO  . REPLACEMENT TOTAL KNEE Bilateral   . rotator cuff surgery     blilateral  . TONSILECTOMY/ADENOIDECTOMY WITH MYRINGOTOMY    . VISCERAL ARTERY INTERVENTION N/A 02/25/2019   Procedure: VISCERAL ARTERY INTERVENTION;  Surgeon: Algernon Huxley, MD;  Location: [place] CV LAB;  Service: Cardiovascular;  Laterality: N/A;    Family History  Problem Relation Age of Onset  . Heart disease Mother   . Stroke Mother   . Hypertension Mother   . Heart disease Father        myocardial infarction age 2  . Breast cancer Neg Hx     SOCIAL HX: Never smoked   Current Outpatient Medications:  .  albuterol (VENTOLIN HFA) 108 (90 Base) MCG/ACT inhaler, INHALE 2 PUFFS INTO THE LUNGS EVERY 6 (SIX) HOURS AS NEEDED FOR WHEEZING OR SHORTNESS OF BREATH, Disp: 18 g, Rfl: 0 .  aluminum-magnesium hydroxide-simethicone (MAALOX) 169-678-93 MG/5ML SUSP, Take 15 mLs by mouth 4 (four) times daily -  before meals and at bedtime. , Disp: , Rfl:  .  amLODipine (NORVASC) 10 MG tablet, TAKE 1 TABLET BY MOUTH DAILY, Disp: 90 tablet, Rfl: 1 .  atorvastatin (LIPITOR) 20 MG tablet, TAKE 1 TABLET BY MOUTH AT BEDTIME, Disp: 90 tablet, Rfl: 1 .  azelastine (ASTELIN) 0.1 % nasal spray, PLACE 1 SPRAY INTO BOTH NOSTRILS TWO TIMES DAILY, Disp: 30 mL, Rfl: 1 .  benazepril (LOTENSIN) 40 MG tablet, TAKE 1 TABLET BY MOUTH DAILY, Disp: 90 tablet, Rfl: 1 .  busPIRone (BUSPAR) 10 MG tablet, Take 10 mg by mouth 2 (two) times daily. , Disp: , Rfl:  .  calcium citrate-vitamin  D (CITRACAL+D) 315-200 MG-UNIT tablet, Take 1 tablet by mouth daily., Disp: , Rfl:  .  Cholecalciferol (VITAMIN D3) 1000 UNITS CAPS, Take by mouth., Disp: , Rfl:  .  DULoxetine (CYMBALTA) 60 MG capsule, TAKE 1 CAPSULE BY MOUTH EVERY DAY., Disp: 30 capsule, Rfl: 3 .  fluticasone (FLONASE) 50 MCG/ACT nasal spray, Place 2 sprays into both nostrils daily., Disp: 16 g, Rfl: 1 .  furosemide (LASIX) 20 MG tablet, TAKE 1 TABLET BY MOUTH EVERY DAY AS NEEDED, Disp: 90 tablet, Rfl: 0 .  gabapentin (NEURONTIN) 300 MG capsule, TAKE 2 CAPSULES BY MOUTH 3 TIMES A DAY., Disp: 540 capsule, Rfl: 2 .  hydrochlorothiazide (MICROZIDE) 12.5 MG capsule, Take 12.5 mg by mouth  daily., Disp: , Rfl:  .  HYDROcodone-acetaminophen (NORCO) 10-325 MG tablet, Take 1 tablet by mouth 3 (three) times daily., Disp: 10 tablet, Rfl: 0 .  magnesium oxide (MAG-OX) 400 MG tablet, Take 1 tablet (400 mg total) by mouth daily., Disp: 30 tablet, Rfl: 1 .  metoprolol succinate (TOPROL-XL) 100 MG 24 hr tablet, TAKE 1 TABLET BY MOUTH TWICE A DAY WITH OR IMMEDIATELY FOLLOWING A MEAL., Disp: 180 tablet, Rfl: 1 .  morphine (MS CONTIN) 60 MG 12 hr tablet, Take 1 tablet (60 mg total) by mouth 2 (two) times daily., Disp: 10 tablet, Rfl: 0 .  Naldemedine Tosylate (SYMPROIC) 0.2 MG TABS, Take 0.2 mg by mouth daily., Disp: , Rfl:  .  polyethylene glycol powder (GLYCOLAX/MIRALAX) 17 GM/SCOOP powder, MIX 17 GRAMS AS MARKED ON BOTTLE TOP IN 8 OUNCES OF WATER AND DRINK ONCE A DAY AS DIRECTED., Disp: 527 g, Rfl: 0 .  RABEprazole (ACIPHEX) 20 MG tablet, TAKE 1 TABLET BY MOUTH 2 TIMES DAILY BEFORE A MEAL, Disp: 60 tablet, Rfl: 2 .  senna (SENOKOT) 8.6 MG TABS tablet, Take 1 tablet by mouth 2 (two) times daily., Disp: , Rfl:  .  Simethicone (GAS-X PO), Take 2 tablets by mouth 2 (two) times daily., Disp: , Rfl:  .  sodium chloride (OCEAN) 0.65 % nasal spray, Place 1 spray into the nose as needed., Disp: , Rfl:  .  SYNTHROID 100 MCG tablet, TAKE 1 TABLET BY MOUTH  DAILY, Disp: 90 tablet, Rfl: 3 .  traZODone (DESYREL) 50 MG tablet, Take 25-50 mg by mouth at bedtime as needed., Disp: , Rfl:   EXAM:  VITALS per patient if applicable: Weight 244 pounds, pulse 58, blood pressure 136/59, pulse ox 95% with heart rate 51-52 at rest  LUNGS: No sounds of respiratory distress, breathing rate appears normal, no obvious gross SOB, gasping or wheezing  PSYCH/NEURO: pleasant and cooperative, mild anxiety, speech and thought processing grossly intact  ASSESSMENT AND PLAN:  Discussed the following assessment and plan:  Fatigue, unspecified type  Bradycardia  No problem-specific Assessment & Plan notes found for this encounter.  Mrs. Downie is an 86 year old widow who is now the sole caretaker of her adult handicapped son. She has a caretaker that has not been able to come to the house recently. The patient feels that she is not used to doing everything since last weekend and became overly fatigued. She rested, and is feeling better.   Patient advised: Your heart rate is 50-52-58 today at rest. This is not unusual for you. Your oxygen is 95% and BP 136/59. You had fatigue yesterday- better today.   Take half of  the Metoprolol this evening. Check your heart rate periodically and record. Call us tomorrow morning with the pulse and BP. This case was discussed in collaboration with Dr. Nicki Reaper. Dr. Nicki Reaper will follow up with a phone call tomorrow and further recommendations.  You chart shows that your heart rate is always lower at home- when resting than in the office when you have walked into the office.    I discussed the assessment and treatment plan with the patient. The patient was provided an opportunity to ask questions and all were answered. The patient agreed with the plan and demonstrated an understanding of the instructions.   The patient was advised to call back or seek an in-person evaluation if the symptoms worsen or if the condition fails to improve as  anticipated.  Denice Paradise, NP Adult Nurse Practitioner [facility]  Station/[employer] [phone]  I provided 7 minutes of telephone call.

## 2020-06-09 ENCOUNTER — Telehealth: Payer: Self-pay | Admitting: Internal Medicine

## 2020-06-09 NOTE — Telephone Encounter (Signed)
Patient is calling in BP readings; 9:30am today, 131/60, heart rate 52, oxygen 96. 2:45pm; bp 131/53, heart rate 52, oxygen 97. Patient forgot to take BP medication last night or take the metoprolol succinate (TOPROL-XL) 100 MG 24 hr tablet  This morning. Patient has been moving around this afternoon more.

## 2020-06-09 NOTE — Telephone Encounter (Signed)
Patient called in with readings. Advised that she did not take any metoprolol since appt yesterday. She is going to cut her dose in half until Monday, monitor pressures and pulse and then f/u with [place] Monday. She feels back to normal today. Pt wanted to make sure this was okay with you?

## 2020-06-09 NOTE — Telephone Encounter (Signed)
Ok to continue metoprolol 152m in am and 1/2 1053mq pm.  Continue monitoring blood pressure and record.

## 2020-06-09 NOTE — Telephone Encounter (Signed)
Patient needs to know what she should do with the metoprolol succinate (TOPROL-XL) 100 MG 24 hr tablet this weekend.

## 2020-06-09 NOTE — Telephone Encounter (Signed)
LMTCB

## 2020-06-10 ENCOUNTER — Encounter: Payer: Self-pay | Admitting: Nurse Practitioner

## 2020-06-12 ENCOUNTER — Other Ambulatory Visit: Payer: Self-pay | Admitting: Internal Medicine

## 2020-06-12 ENCOUNTER — Telehealth (INDEPENDENT_AMBULATORY_CARE_PROVIDER_SITE_OTHER): Payer: PPO | Admitting: Internal Medicine

## 2020-06-12 ENCOUNTER — Encounter: Payer: Self-pay | Admitting: Internal Medicine

## 2020-06-12 DIAGNOSIS — I10 Essential (primary) hypertension: Secondary | ICD-10-CM

## 2020-06-12 DIAGNOSIS — M25562 Pain in left knee: Secondary | ICD-10-CM | POA: Diagnosis not present

## 2020-06-12 DIAGNOSIS — R5383 Other fatigue: Secondary | ICD-10-CM

## 2020-06-12 DIAGNOSIS — M7061 Trochanteric bursitis, right hip: Secondary | ICD-10-CM | POA: Diagnosis not present

## 2020-06-12 DIAGNOSIS — R001 Bradycardia, unspecified: Secondary | ICD-10-CM | POA: Diagnosis not present

## 2020-06-12 NOTE — Progress Notes (Signed)
Patient ID: Kimberly Roy, female   DOB: [birth year], 86 y.o.   MRN: [id number]   Virtual Visit via video Note  This visit type was conducted due to national recommendations for restrictions regarding the COVID-19 pandemic (e.g. social distancing).  This format is felt to be most appropriate for this patient at this time.  All issues noted in this document were discussed and addressed.  No physical exam was performed (except for noted visual exam findings with Video Visits).   I connected with [name] today by telephone and verified that I am speaking with the correct person using two identifiers. Location patient: home Location provider: work  Persons participating in the virtual visit: patient, provider  The limitations, risks, security and privacy concerns of performing an evaluation and management service by telephone and the availability of in person appointments have been discussed.  It has also been discussed with the patient that there may be a patient responsible charge related to this service. The patient expressed understanding and agreed to proceed.   Reason for visit: work in appt.   HPI: Was seen as a work in appt by Dawson Bills 06/08/20 - evaluated for weakness and low pulse rate.  Note reviewed.  Pulse rate in 50s.  Her metoprolol dose was adjusted.  She is currently taking 1/2 174m bid metoprolol.  She feels better.  Walking better.  More strength.  No chest pain or tightness.  Breathing stable.  No increased cough or congestion.  She has been having to use her arms to push up out of the chair.  Aggravated her shoulder.  Has had injections.  Seeing ortho today for her knee.  No nausea or vomiting.  Eating.  Does report rash - right buttock.  Clearing up.  Used TCC.     ROS: See pertinent positives and negatives per HPI.  Past Medical History:  Diagnosis Date  . Anemia   . Anxiety   . Chest pain   . CHF ([place]estive heart failure) ([place]   . Constipation   . DDD (degenerative  disc disease), cervical   . Depression   . D[place] ([place]) ([place]   . Dysphonia   . Dyspnea   . Fatty liver   . Fatty liver   . Headache   . Hyperlipidemia   . Hyperpiesia   . Hypertension   . Hypothyroidism   . Interstitial cystitis   . Left ventricular dysfunction   . Lymphedema   . Nephrolithiasis   . Obstructive sleep apnea   . Osteoarthritis    knees/cervical and lumbar spine  . Pulmonary hypertension [place]   . Pulmonary nodules    followed by Dr FRaul Del . Pure hypercholesterolemia   . Renal cyst    right    Past Surgical History:  Procedure Laterality Date  . ABDOMINAL HYSTERECTOMY     ovaries left in place  . APPENDECTOMY    . Back Surgeries    . BACK SURGERY    . BREAST REDUCTION SURGERY     3/99  . CARDIAC CATHETERIZATION    . cataracts Bilateral   . CERVICAL SPINE SURGERY    . ESOPHAGEAL MANOMETRY N/A 08/02/2015   Procedure: ESOPHAGEAL MANOMETRY (EM);  Surgeon: MJosefine Class MD;  [facility];  Service: Endoscopy;  Laterality: N/A;  . ESOPHAGOGASTRODUODENOSCOPY N/A 02/27/2015   Procedure: ESOPHAGOGASTRODUODENOSCOPY (EGD);  Surgeon: PHulen Luster MD;  [facility];  Service: Gastroenterology;  Laterality: N/A;  . ESOPHAGOGASTRODUODENOSCOPY (EGD) WITH  PROPOFOL N/A 10/30/2018   Procedure: ESOPHAGOGASTRODUODENOSCOPY (EGD) WITH PROPOFOL;  Surgeon: Lollie Sails, MD;  Location[facility] END[facility]COPY;  Service: Endoscopy;  Laterality: N/A;  . EXCISIONAL HEMORRHOIDECTOMY    . EYE SURGERY    . FRACTURE SURGERY    . HEMORRHOID SURGERY    . HIP SURGERY  2013   Right hip surgery  . JOINT REPLACEMENT    . KNEE ARTHROSCOPY     left and right  . ORIF FEMUR FRACTURE Right 08/07/2018   Procedure: OPEN REDUCTION INTERNAL FIXATION (ORIF) DISTAL FEMUR FRACTURE;  Surgeon: Hessie Knows, MD;  Location: [facility] ORS;  Service: Orthopedics;  Laterality: Right;  . REDUCTION MAMMAPLASTY Bilateral YRS AGO  . REPLACEMENT TOTAL KNEE Bilateral   .  rotator cuff surgery     blilateral  . TONSILECTOMY/ADENOIDECTOMY WITH MYRINGOTOMY    . VISCERAL ARTERY INTERVENTION N/A 02/25/2019   Procedure: VISCERAL ARTERY INTERVENTION;  Surgeon: Algernon Huxley, MD;  Location: [place] CV LAB;  Service: Cardiovascular;  Laterality: N/A;    Family History  Problem Relation Age of Onset  . Heart disease Mother   . Stroke Mother   . Hypertension Mother   . Heart disease Father        myocardial infarction age 79  . Breast cancer Neg Hx     SOCIAL HX: reviewed.    Current Outpatient Medications:  .  albuterol (VENTOLIN HFA) 108 (90 Base) MCG/ACT inhaler, INHALE 2 PUFFS INTO THE LUNGS EVERY 6 (SIX) HOURS AS NEEDED FOR WHEEZING OR SHORTNESS OF BREATH, Disp: 18 g, Rfl: 0 .  aluminum-magnesium hydroxide-simethicone (MAALOX) 324-401-02 MG/5ML SUSP, Take 15 mLs by mouth 4 (four) times daily -  before meals and at bedtime. , Disp: , Rfl:  .  amLODipine (NORVASC) 10 MG tablet, TAKE 1 TABLET BY MOUTH DAILY, Disp: 90 tablet, Rfl: 1 .  atorvastatin (LIPITOR) 20 MG tablet, TAKE 1 TABLET BY MOUTH AT BEDTIME, Disp: 90 tablet, Rfl: 1 .  azelastine (ASTELIN) 0.1 % nasal spray, PLACE 1 SPRAY INTO BOTH NOSTRILS TWO TIMES DAILY, Disp: 30 mL, Rfl: 1 .  benazepril (LOTENSIN) 40 MG tablet, TAKE 1 TABLET BY MOUTH DAILY, Disp: 90 tablet, Rfl: 1 .  busPIRone (BUSPAR) 10 MG tablet, Take 10 mg by mouth 2 (two) times daily. , Disp: , Rfl:  .  calcium citrate-vitamin D (CITRACAL+D) 315-200 MG-UNIT tablet, Take 1 tablet by mouth daily., Disp: , Rfl:  .  Cholecalciferol (VITAMIN D3) 1000 UNITS CAPS, Take by mouth., Disp: , Rfl:  .  DULoxetine (CYMBALTA) 60 MG capsule, TAKE 1 CAPSULE BY MOUTH EVERY DAY., Disp: 30 capsule, Rfl: 3 .  fluticasone (FLONASE) 50 MCG/ACT nasal spray, Place 2 sprays into both nostrils daily., Disp: 16 g, Rfl: 1 .  furosemide (LASIX) 20 MG tablet, TAKE 1 TABLET BY MOUTH EVERY DAY AS NEEDED, Disp: 90 tablet, Rfl: 0 .  gabapentin (NEURONTIN) 300 MG  capsule, TAKE 2 CAPSULES BY MOUTH 3 TIMES A DAY., Disp: 540 capsule, Rfl: 2 .  hydrochlorothiazide (MICROZIDE) 12.5 MG capsule, Take 12.5 mg by mouth daily., Disp: , Rfl:  .  HYDROcodone-acetaminophen (NORCO) 10-325 MG tablet, Take 1 tablet by mouth 3 (three) times daily., Disp: 10 tablet, Rfl: 0 .  magnesium oxide (MAG-OX) 400 MG tablet, Take 1 tablet (400 mg total) by mouth daily., Disp: 30 tablet, Rfl: 1 .  metoprolol succinate (TOPROL-XL) 100 MG 24 hr tablet, TAKE 1 TABLET BY MOUTH TWICE A DAY WITH OR IMMEDIATELY FOLLOWING A MEAL., Disp: 180 tablet,  Rfl: 1 .  morphine (MS CONTIN) 60 MG 12 hr tablet, Take 1 tablet (60 mg total) by mouth 2 (two) times daily., Disp: 10 tablet, Rfl: 0 .  Naldemedine Tosylate (SYMPROIC) 0.2 MG TABS, Take 0.2 mg by mouth daily., Disp: , Rfl:  .  polyethylene glycol powder (GLYCOLAX/MIRALAX) 17 GM/SCOOP powder, MIX 17 GRAMS AS MARKED ON BOTTLE TOP IN 8 OUNCES OF WATER AND DRINK ONCE A DAY AS DIRECTED., Disp: 527 g, Rfl: 0 .  RABEprazole (ACIPHEX) 20 MG tablet, TAKE 1 TABLET BY MOUTH 2 TIMES DAILY BEFORE A MEAL, Disp: 60 tablet, Rfl: 2 .  senna (SENOKOT) 8.6 MG TABS tablet, Take 1 tablet by mouth 2 (two) times daily., Disp: , Rfl:  .  Simethicone (GAS-X PO), Take 2 tablets by mouth 2 (two) times daily., Disp: , Rfl:  .  sodium chloride (OCEAN) 0.65 % nasal spray, Place 1 spray into the nose as needed., Disp: , Rfl:  .  SYNTHROID 100 MCG tablet, TAKE 1 TABLET BY MOUTH DAILY, Disp: 90 tablet, Rfl: 3 .  traZODone (DESYREL) 50 MG tablet, Take 25-50 mg by mouth at bedtime as needed., Disp: , Rfl:   EXAM:  VITALS per patient if applicable: blood pressures averaging 120-130s/50-60s.  Pulse 48-58.    GENERAL: alert.  Answering questions appropriately.  Sounds to be in no acute distress.    PSYCH/NEURO: pleasant and cooperative, no obvious depression or anxiety, speech and thought processing grossly intact  ASSESSMENT AND PLAN:  Discussed the following assessment and  plan:  Problem List Items Addressed This Visit    Hypertension    Blood pressure doing well on 1/2 metoprolol 169m bid.  Also on amlodipine.  Follow pressures.  Continue 1/2 dose.  Feels better.       Fatigue    Improved.  Follow.  Continue on 1/2 dose metoprolol.        Bradycardia    Bradycardia as outlined.  On 1/2 metoprolol now (1/2 1054mbid).  Feels better.  Follow pressures.  Follow pulse rate.            I discussed the assessment and treatment plan with the patient. The patient was provided an opportunity to ask questions and all were answered. The patient agreed with the plan and demonstrated an understanding of the instructions.   The patient was advised to call back or seek an in-person evaluation if the symptoms worsen or if the condition fails to improve as anticipated.  I provided 22 minutes of non-face-to-face time during this encounter.   ChEinar PheasantMD

## 2020-06-13 NOTE — Telephone Encounter (Signed)
Hard to tell for sure by picture.  Can confirm with pt if raised, scaling, etc.  She states is getting better.  If desires, can schedule in person visit to evaluate.  Will need to follow until clears.

## 2020-06-14 DIAGNOSIS — F330 Major depressive disorder, recurrent, mild: Secondary | ICD-10-CM | POA: Diagnosis not present

## 2020-06-14 DIAGNOSIS — F5105 Insomnia due to other mental disorder: Secondary | ICD-10-CM | POA: Diagnosis not present

## 2020-06-14 DIAGNOSIS — F419 Anxiety disorder, unspecified: Secondary | ICD-10-CM | POA: Diagnosis not present

## 2020-06-14 NOTE — Telephone Encounter (Signed)
Patients care giver aware. Area is not raised or scaling. They will continue to monitor and she will let me know if she decides she wants an appt

## 2020-06-18 ENCOUNTER — Encounter: Payer: Self-pay | Admitting: Internal Medicine

## 2020-06-18 NOTE — Assessment & Plan Note (Signed)
Improved.  Follow.  Continue on 1/2 dose metoprolol.

## 2020-06-18 NOTE — Assessment & Plan Note (Signed)
Bradycardia as outlined.  On 1/2 metoprolol now (1/2 133m bid).  Feels better.  Follow pressures.  Follow pulse rate.

## 2020-06-18 NOTE — Assessment & Plan Note (Signed)
Blood pressure doing well on 1/2 metoprolol 128m bid.  Also on amlodipine.  Follow pressures.  Continue 1/2 dose.  Feels better.

## 2020-06-19 NOTE — Telephone Encounter (Signed)
Spoke with patient to confim ok. Heart rate has came up. She does not feel as weak. Stated that even earlier in the day she did not feel weaker than normal for her. She had been feeling better for the last few days. She would like to continue to monitor and let me know how she feels in the morning. She agreed if she has any acute symptoms- she will be evaluated over night.

## 2020-06-19 NOTE — Telephone Encounter (Signed)
BP reading on Saturday was 142/52 HR 55. Yesterday was 148/61 HR 50. This morning around 10:30am it was 128/54 HR 44. She just took her bp around 2:30pm and it was 168/62 HR 48. Pt said she is extremely weak. She wants to know if Dr. Nicki Reaper wants to make any other changes?

## 2020-06-23 ENCOUNTER — Other Ambulatory Visit: Payer: Self-pay

## 2020-06-23 ENCOUNTER — Inpatient Hospital Stay (HOSPITAL_BASED_OUTPATIENT_CLINIC_OR_DEPARTMENT_OTHER): Payer: PPO | Admitting: Internal Medicine

## 2020-06-23 ENCOUNTER — Telehealth: Payer: Self-pay | Admitting: Internal Medicine

## 2020-06-23 ENCOUNTER — Inpatient Hospital Stay: Payer: PPO | Attending: Internal Medicine

## 2020-06-23 DIAGNOSIS — Z881 Allergy status to other antibiotic agents status: Secondary | ICD-10-CM | POA: Insufficient documentation

## 2020-06-23 DIAGNOSIS — Z882 Allergy status to sulfonamides status: Secondary | ICD-10-CM | POA: Diagnosis not present

## 2020-06-23 DIAGNOSIS — E039 Hypothyroidism, unspecified: Secondary | ICD-10-CM | POA: Insufficient documentation

## 2020-06-23 DIAGNOSIS — Z8719 Personal history of other diseases of the digestive system: Secondary | ICD-10-CM | POA: Diagnosis not present

## 2020-06-23 DIAGNOSIS — G4733 Obstructive sleep apnea (adult) (pediatric): Secondary | ICD-10-CM | POA: Diagnosis not present

## 2020-06-23 DIAGNOSIS — M1990 Unspecified osteoarthritis, unspecified site: Secondary | ICD-10-CM | POA: Insufficient documentation

## 2020-06-23 DIAGNOSIS — Z9049 Acquired absence of other specified parts of digestive tract: Secondary | ICD-10-CM | POA: Diagnosis not present

## 2020-06-23 DIAGNOSIS — I509 Heart failure, unspecified: Secondary | ICD-10-CM | POA: Diagnosis not present

## 2020-06-23 DIAGNOSIS — D500 Iron deficiency anemia secondary to blood loss (chronic): Secondary | ICD-10-CM

## 2020-06-23 DIAGNOSIS — Z888 Allergy status to other drugs, medicaments and biological substances status: Secondary | ICD-10-CM | POA: Diagnosis not present

## 2020-06-23 DIAGNOSIS — M2550 Pain in unspecified joint: Secondary | ICD-10-CM | POA: Insufficient documentation

## 2020-06-23 DIAGNOSIS — D649 Anemia, unspecified: Secondary | ICD-10-CM | POA: Diagnosis not present

## 2020-06-23 DIAGNOSIS — R5383 Other fatigue: Secondary | ICD-10-CM | POA: Insufficient documentation

## 2020-06-23 DIAGNOSIS — Z8249 Family history of ischemic heart disease and other diseases of the circulatory system: Secondary | ICD-10-CM | POA: Insufficient documentation

## 2020-06-23 DIAGNOSIS — I272 Pulmonary hypertension, unspecified: Secondary | ICD-10-CM | POA: Diagnosis not present

## 2020-06-23 DIAGNOSIS — M549 Dorsalgia, unspecified: Secondary | ICD-10-CM | POA: Diagnosis not present

## 2020-06-23 DIAGNOSIS — E785 Hyperlipidemia, unspecified: Secondary | ICD-10-CM | POA: Diagnosis not present

## 2020-06-23 DIAGNOSIS — Z86718 Personal history of other venous thrombosis and embolism: Secondary | ICD-10-CM | POA: Diagnosis not present

## 2020-06-23 DIAGNOSIS — N3010 Interstitial cystitis (chronic) without hematuria: Secondary | ICD-10-CM | POA: Insufficient documentation

## 2020-06-23 DIAGNOSIS — Z823 Family history of stroke: Secondary | ICD-10-CM | POA: Diagnosis not present

## 2020-06-23 DIAGNOSIS — I110 Hypertensive heart disease with heart failure: Secondary | ICD-10-CM | POA: Insufficient documentation

## 2020-06-23 DIAGNOSIS — Z79899 Other long term (current) drug therapy: Secondary | ICD-10-CM | POA: Insufficient documentation

## 2020-06-23 DIAGNOSIS — Z993 Dependence on wheelchair: Secondary | ICD-10-CM | POA: Insufficient documentation

## 2020-06-23 LAB — COMPREHENSIVE METABOLIC PANEL
ALT: 10 U/L (ref 0–44)
AST: 17 U/L (ref 15–41)
Albumin: 3.7 g/dL (ref 3.5–5.0)
Alkaline Phosphatase: 90 U/L (ref 38–126)
Anion gap: 6 (ref 5–15)
BUN: 22 mg/dL (ref 8–23)
CO2: 31 mmol/L (ref 22–32)
Calcium: 8.9 mg/dL (ref 8.9–10.3)
Chloride: 103 mmol/L (ref 98–111)
Creatinine, Ser: 0.99 mg/dL (ref 0.44–1.00)
GFR, Estimated: 55 mL/min — ABNORMAL LOW (ref >60)
Glucose, Bld: 98 mg/dL (ref 70–99)
Potassium: 4.9 mmol/L (ref 3.5–5.1)
Sodium: 140 mmol/L (ref 135–145)
Total Bilirubin: 0.5 mg/dL (ref 0.3–1.2)
Total Protein: 6.2 g/dL — ABNORMAL LOW (ref 6.5–8.1)

## 2020-06-23 LAB — IRON AND TIBC
Iron: 50 ug/dL (ref 28–170)
Saturation Ratios: 16 % (ref 10.4–31.8)
TIBC: 318 ug/dL (ref 250–450)
UIBC: 268 ug/dL

## 2020-06-23 LAB — CBC WITH DIFFERENTIAL/PLATELET
Abs Immature Granulocytes: 0.02 10*3/uL (ref 0.00–0.07)
Basophils Absolute: 0 10*3/uL (ref 0.0–0.1)
Basophils Relative: 1 %
Eosinophils Absolute: 0.3 10*3/uL (ref 0.0–0.5)
Eosinophils Relative: 3 %
HCT: 32.8 % — ABNORMAL LOW (ref 36.0–46.0)
Hemoglobin: 10.5 g/dL — ABNORMAL LOW (ref 12.0–15.0)
Immature Granulocytes: 0 %
Lymphocytes Relative: 25 %
Lymphs Abs: 2 10*3/uL (ref 0.7–4.0)
MCH: 32.1 pg (ref 26.0–34.0)
MCHC: 32 g/dL (ref 30.0–36.0)
MCV: 100.3 fL — ABNORMAL HIGH (ref 80.0–100.0)
Monocytes Absolute: 0.6 10*3/uL (ref 0.1–1.0)
Monocytes Relative: 8 %
Neutro Abs: 5 10*3/uL (ref 1.7–7.7)
Neutrophils Relative %: 63 %
Platelets: 147 10*3/uL — ABNORMAL LOW (ref 150–400)
RBC: 3.27 MIL/uL — ABNORMAL LOW (ref 3.87–5.11)
RDW: 14.6 % (ref 11.5–15.5)
WBC: 7.9 10*3/uL (ref 4.0–10.5)
nRBC: 0 % (ref 0.0–0.2)

## 2020-06-23 LAB — FERRITIN: Ferritin: 18 ng/mL (ref 11–307)

## 2020-06-23 NOTE — Assessment & Plan Note (Addendum)
#  Chronic anemia-unclear etiology.  Hemoglobin stable between 10-11.  Mild symptoms of ongoing fatigue.  Iron studies-August 2021-saturation 16% ferritin-27. Today-emoglobin today is 10.5.  Iron studies pending.   #Discussed with the patient that if iron levels are low today would recommend IV iron infusion especially as patient is mildly symptomatic. However discussed with the patient and daughter that if iron levels are adequate-I would not recommend IV iron infusion.   #Also discussed the possibility of MDS/low-grade-given the chronic anemia; and mild macrocytosis.  Discussed that a bone marrow biopsy would be helpful in diagnosing MDS.  However as patient is clinically stable at this time I would not recommend a bone marrow biopsy.  A bone marrow biopsy would be indicated if patient hemoglobin is dropping or patient is more symptomatic.    Asymptomatic.  Saturations 23%; ferritin 47.  Will hold off on Venofer at this time given improvement in bloodwork and that she is asymptomatic. Patient agrees.   # DISPOSITION: will call [phone] with results/IV venofer.  # Follow up TBD- Dr.B

## 2020-06-23 NOTE — Telephone Encounter (Signed)
Pt needs a refill on RABEprazole (ACIPHEX) 20 MG tablet and SYNTHROID 100 MCG tablet Sent to [facility] it is cheaper for pt

## 2020-06-23 NOTE — Progress Notes (Signed)
[place] NOTE  Patient Care Team: Einar Pheasant, [name], [name], [name], MD (Otolaryngology)  CHIEF COMPLAINTS/PURPOSE OF CONSULTATION: Anemia  # CHRONIC MILD ANEMIA hb ~11.[Previous Dr.Gittin pt]; [name] 2017- work up [employer]; stool card; Iron/ monoclonal work up- NEGATIVE]   # CHRONIC INTERSTITIAL CYSTITIS/ wheel chair bound  Oncology History   No history exists.     HISTORY OF PRESENTING ILLNESS: pt is a poor historian.  Kimberly Roy 86 y.o.  female  with a history of chronic anemia- is here for a follow up.   Patient denies any blood in stools or black or stools.  Denies any blood in urine.  No nausea vomiting.  No weight loss.  Chronic mild to moderate fatigue.   Review of Systems  Constitutional: Positive for malaise/fatigue. Negative for chills, diaphoresis and fever.  HENT: Negative for nosebleeds and sore throat.   Eyes: Negative for double vision.  Respiratory: Negative for cough, hemoptysis, sputum production, shortness of breath and wheezing.   Cardiovascular: Negative for chest pain, palpitations, orthopnea and leg swelling.  Gastrointestinal: Negative for abdominal pain, blood in stool, constipation, diarrhea, heartburn, melena, nausea and vomiting.  Genitourinary: Negative for dysuria, frequency and urgency.  Musculoskeletal: Positive for back pain and joint pain.  Skin: Negative.  Negative for itching and rash.  Neurological: Negative for dizziness, tingling, focal weakness, weakness and headaches.  Endo/Heme/Allergies: Does not bruise/bleed easily.  Psychiatric/Behavioral: Negative for depression. The patient is not nervous/anxious and does not have insomnia.      MEDICAL HISTORY:  Past Medical History:  Diagnosis Date  . Anemia   . Anxiety   . Chest pain   . CHF (congestive heart failure) ([place])   .  Constipation   . DDD (degenerative disc disease), cervical   . Depression   . DVT (deep venous thrombosis) ([place])   . Dysphonia   . Dyspnea   . Fatty liver   . Fatty liver   . Headache   . Hyperlipidemia   . Hyperpiesia   . Hypertension   . Hypothyroidism   . Interstitial cystitis   . Left ventricular dysfunction   . Lymphedema   . Nephrolithiasis   . Obstructive sleep apnea   . Osteoarthritis    knees/cervical and lumbar spine  . Pulmonary hypertension ([place])   . Pulmonary nodules    followed by Dr Raul Del  . Pure hypercholesterolemia   . Renal cyst    right    SURGICAL HISTORY: Past Surgical History:  Procedure Laterality Date  . ABDOMINAL HYSTERECTOMY     ovaries left in place  . APPENDECTOMY    . Back Surgeries    . BACK SURGERY    . BREAST REDUCTION SURGERY     3/99  . CARDIAC CATHETERIZATION    . cataracts Bilateral   . CERVICAL SPINE SURGERY    . ESOPHAGEAL MANOMETRY N/A 08/02/2015   Procedure: ESOPHAGEAL MANOMETRY (EM);  Surgeon: Josefine Class, MD;  Location[facility] ENDOSCOPY;  Service: Endoscopy;  Laterality: N/A;  . ESOPHAGOGASTRODUODENOSCOPY N/A 02/27/2015   Procedure: ESOPHAGOGASTRODUODENOSCOPY (EGD);  Surgeon: Hulen Luster, MD;  Location: [facility] ENDOSCOPY;  Service: Gastroenterology;  Laterality: N/A;  . ESOPHAGOGASTRODUODENOSCOPY (EGD) WITH PROPOFOL N/A 10/30/2018   Procedure: ESOPHAGOGASTRODUODENOSCOPY (EGD) WITH PROPOFOL;  Surgeon: Lollie Sails, MD;  Location: [facility] ENDOSCOPY;  Service: Endoscopy;  Laterality: N/A;  . EXCISIONAL HEMORRHOIDECTOMY    . EYE  SURGERY    . FRACTURE SURGERY    . HEMORRHOID SURGERY    . HIP SURGERY  2013   Right hip surgery  . JOINT REPLACEMENT    . KNEE ARTHROSCOPY     left and right  . ORIF FEMUR FRACTURE Right 08/07/2018   Procedure: OPEN REDUCTION INTERNAL FIXATION (ORIF) DISTAL FEMUR FRACTURE;  Surgeon: Hessie Knows, MD;  Location: [facility] ORS;  Service: Orthopedics;  Laterality: Right;  . REDUCTION MAMMAPLASTY  Bilateral YRS AGO  . REPLACEMENT TOTAL KNEE Bilateral   . rotator cuff surgery     blilateral  . TONSILECTOMY/ADENOIDECTOMY WITH MYRINGOTOMY    . VISCERAL ARTERY INTERVENTION N/A 02/25/2019   Procedure: VISCERAL ARTERY INTERVENTION;  Surgeon: Algernon Huxley, MD;  Location: [place] CV LAB;  Service: Cardiovascular;  Laterality: N/A;    SOCIAL HISTORY: Social History   Socioeconomic History  . Marital status: Widowed    Spouse name: Not on file  . Number of children: 2  . Years of education: Not on file  . Highest education level: Not on file  Occupational History    Comment: insurance agency  Tobacco Use  . Smoking status: Never Smoker  . Smokeless tobacco: Never Used  Vaping Use  . Vaping Use: Never used  Substance and Sexual Activity  . Alcohol use: No    Alcohol/week: 0.0 standard drinks  . Drug use: Never  . Sexual activity: Not Currently  Other Topics Concern  . Not on file  Social History Narrative  . Not on file   Social Determinants of Health   Financial Resource Strain:   . Difficulty of Paying Living Expenses: Not on file  Food Insecurity:   . Worried About Charity fundraiser in the Last Year: Not on file  . Ran Out of Food in the Last Year: Not on file  Transportation Needs:   . Lack of Transportation (Medical): Not on file  . Lack of Transportation (Non-Medical): Not on file  Physical Activity:   . Days of Exercise per Week: Not on file  . Minutes of Exercise per Session: Not on file  Stress:   . Feeling of Stress : Not on file  Social Connections:   . Frequency of Communication with Friends and Family: Not on file  . Frequency of Social Gatherings with Friends and Family: Not on file  . Attends Religious Services: Not on file  . Active Member of Clubs or Organizations: Not on file  . Attends Archivist Meetings: Not on file  . Marital Status: Not on file  Intimate Partner Violence:   . Fear of Current or Ex-Partner: Not on file  .  Emotionally Abused: Not on file  . Physically Abused: Not on file  . Sexually Abused: Not on file    FAMILY HISTORY: Family History  Problem Relation Age of Onset  . Heart disease Mother   . Stroke Mother   . Hypertension Mother   . Heart disease Father        myocardial infarction age 59  . Breast cancer Neg Hx     ALLERGIES:  is allergic to lyrica [pregabalin], omnicef [cefdinir], atarax [hydroxyzine], dicyclomine, hydroxyzine hcl, levaquin [levofloxacin], nitrofurantoin, nucynta er [tapentadol hcl er], oxybutynin, zoloft [sertraline hcl], biaxin [clarithromycin], sertraline, sulfa antibiotics, sulfasalazine, tape, and tapentadol.  MEDICATIONS:  Current Outpatient Medications  Medication Sig Dispense Refill  . aluminum-magnesium hydroxide-simethicone (MAALOX) 449-753-00 MG/5ML SUSP Take 15 mLs by mouth 4 (four) times daily -  before meals  and at bedtime.     Marland Kitchen amLODipine (NORVASC) 10 MG tablet TAKE 1 TABLET BY MOUTH DAILY 90 tablet 1  . atorvastatin (LIPITOR) 20 MG tablet TAKE 1 TABLET BY MOUTH AT BEDTIME 90 tablet 1  . azelastine (ASTELIN) 0.1 % nasal spray PLACE 1 SPRAY INTO BOTH NOSTRILS TWO TIMES DAILY 30 mL 1  . benazepril (LOTENSIN) 40 MG tablet TAKE 1 TABLET BY MOUTH DAILY 90 tablet 1  . busPIRone (BUSPAR) 10 MG tablet Take 10 mg by mouth 2 (two) times daily.     . calcium citrate-vitamin D (CITRACAL+D) 315-200 MG-UNIT tablet Take 1 tablet by mouth daily.    . Cholecalciferol (VITAMIN D3) 1000 UNITS CAPS Take by mouth.    . DULoxetine (CYMBALTA) 60 MG capsule TAKE 1 CAPSULE BY MOUTH EVERY DAY. 30 capsule 3  . fluticasone (FLONASE) 50 MCG/ACT nasal spray Place 2 sprays into both nostrils daily. 16 g 1  . furosemide (LASIX) 20 MG tablet TAKE 1 TABLET BY MOUTH EVERY DAY AS NEEDED 90 tablet 0  . gabapentin (NEURONTIN) 300 MG capsule TAKE 2 CAPSULES BY MOUTH 3 TIMES A DAY. 540 capsule 2  . hydrochlorothiazide (MICROZIDE) 12.5 MG capsule Take 12.5 mg by mouth daily.    Marland Kitchen  HYDROcodone-acetaminophen (NORCO) 10-325 MG tablet Take 1 tablet by mouth 3 (three) times daily. 10 tablet 0  . magnesium oxide (MAG-OX) 400 MG tablet Take 1 tablet (400 mg total) by mouth daily. 30 tablet 1  . metoprolol succinate (TOPROL-XL) 100 MG 24 hr tablet TAKE 1 TABLET BY MOUTH TWICE A DAY WITH OR IMMEDIATELY FOLLOWING A MEAL. (Patient taking differently: Take 50 mg by mouth in the morning and at bedtime. ) 180 tablet 1  . morphine (MS CONTIN) 60 MG 12 hr tablet Take 1 tablet (60 mg total) by mouth 2 (two) times daily. 10 tablet 0  . Naldemedine Tosylate (SYMPROIC) 0.2 MG TABS Take 0.2 mg by mouth daily.    . polyethylene glycol powder (GLYCOLAX/MIRALAX) 17 GM/SCOOP powder MIX 17 GRAMS AS MARKED ON BOTTLE TOP IN 8 OUNCES OF WATER AND DRINK ONCE A DAY AS DIRECTED. 527 g 0  . RABEprazole (ACIPHEX) 20 MG tablet TAKE 1 TABLET BY MOUTH 2 TIMES DAILY BEFORE A MEAL 60 tablet 2  . senna (SENOKOT) 8.6 MG TABS tablet Take 1 tablet by mouth 2 (two) times daily.    . Simethicone (GAS-X PO) Take 2 tablets by mouth 2 (two) times daily.    . sodium chloride (OCEAN) 0.65 % nasal spray Place 1 spray into the nose as needed.    Marland Kitchen SYNTHROID 100 MCG tablet TAKE 1 TABLET BY MOUTH DAILY 90 tablet 3  . traZODone (DESYREL) 50 MG tablet Take 25-50 mg by mouth at bedtime as needed.    Marland Kitchen albuterol (VENTOLIN HFA) 108 (90 Base) MCG/ACT inhaler INHALE 2 PUFFS INTO THE LUNGS EVERY 6 (SIX) HOURS AS NEEDED FOR WHEEZING OR SHORTNESS OF BREATH (Patient not taking: Reported on 06/23/2020) 18 g 0   No current facility-administered medications for this visit.      Marland Kitchen  PHYSICAL EXAMINATION: ECOG PERFORMANCE STATUS: 0 - Asymptomatic  Vitals:   06/23/20 1136  BP: (!) 132/49  Pulse: (!) 54  Resp: 16  Temp: (!) 97.3 F (36.3 C)  SpO2: 96%   There were no vitals filed for this visit.  Physical Exam Constitutional:      Comments: Patient in wheelchair because of chronic arthritic problems.  HENT:     Head:  Normocephalic  and atraumatic.     Mouth/Throat:     Pharynx: No oropharyngeal exudate.  Eyes:     Pupils: Pupils are equal, round, and reactive to light.  Cardiovascular:     Rate and Rhythm: Normal rate and regular rhythm.  Pulmonary:     Effort: Pulmonary effort is normal. No respiratory distress.     Breath sounds: Normal breath sounds. No wheezing.  Abdominal:     General: Bowel sounds are normal. There is no distension.     Palpations: Abdomen is soft. There is no mass.     Tenderness: There is no abdominal tenderness. There is no guarding or rebound.  Musculoskeletal:        General: No tenderness. Normal range of motion.     Cervical back: Normal range of motion and neck supple.  Skin:    General: Skin is warm.  Neurological:     Mental Status: She is alert and oriented to person, place, and time.  Psychiatric:        Mood and Affect: Affect normal.     LABORATORY DATA:  I have reviewed the data as listed Lab Results  Component Value Date   WBC 7.9 06/23/2020   HGB 10.5 (L) 06/23/2020   HCT 32.8 (L) 06/23/2020   MCV 100.3 (H) 06/23/2020   PLT 147 (L) 06/23/2020   Recent Labs    06/30/19 1221 06/30/19 1221 07/05/19 1520 07/05/19 1520 10/29/19 0000 11/07/19 0000 01/05/20 1016 04/27/20 1022 06/23/20 1123  NA 141   < > 140   < > 140  --  142 141 140  K 5.3 No hemolysis seen*   < > 3.9   < > 5.3*   < > 4.9 4.8 4.9  CL 102   < > 101   < > 100  --  103 103 103  CO2 34*   < > 30   < > 30*  --  32 33* 31  GLUCOSE 92   < > 131*   < > 89  --  98 84 98  BUN 21   < > 18   < > 14  --  _0 CREATININE 1.09   < > 0.88   < > 0.84  --  0.77 0.98 0.99  CALCIUM 10.2   < > 9.5   < > 9.8  --  10.0 9.6 8.9  GFRNONAA  --   --  59*  --  63  --   --   --  55*  GFRAA  --   --  >60  --  73  --   --   --   --   PROT 6.7   < >  --   --  6.0  --  6.8 6.2 6.2*  ALBUMIN 4.2   < >  --   --  3.3*  --  4.3 3.9 3.7  AST 16   < >  --   --  15  --  _1 ALT 11   < >  --   --  6   --  _2 ALKPHOS 77   < >  --   --  100  --  94 96 90  BILITOT 0.6   < >  --   --  <0.2  --  0.7 0.4 0.5  BILIDIR 0.2  --   --   --   --   --  0.2  0.1  --    < > = values in this interval not displayed.    RADIOGRAPHIC STUDIES: I have personally reviewed the radiological images as listed and agreed with the findings in the report. No results found.  ASSESSMENT & PLAN:   Anemia due to blood loss, chronic #  Chronic anemia-unclear etiology.  Hemoglobin stable between 10-11.  Mild symptoms of ongoing fatigue.  Iron studies-August 2021-saturation 16% ferritin-27. Today-emoglobin today is 10.5.  Iron studies pending.   #Discussed with the patient that if iron levels are low today would recommend IV iron infusion especially as patient is mildly symptomatic. However discussed with the patient and daughter that if iron levels are adequate-I would not recommend IV iron infusion.   #Also discussed the possibility of MDS/low-grade-given the chronic anemia; and mild macrocytosis.  Discussed that a bone marrow biopsy would be helpful in diagnosing MDS.  However as patient is clinically stable at this time I would not recommend a bone marrow biopsy.  A bone marrow biopsy would be indicated if patient hemoglobin is dropping or patient is more symptomatic.    Asymptomatic.  Saturations 23%; ferritin 47.  Will hold off on Venofer at this time given improvement in bloodwork and that she is asymptomatic. Patient agrees.   # DISPOSITION: will call [phone] with results/IV venofer.  # Follow up TBD- Dr.B       Cammie Sickle, MD 06/23/2020 1:08 PM

## 2020-06-23 NOTE — Telephone Encounter (Signed)
Pt requested 90 day refills due to insurance and the medications being cheaper. Ok to refill as 90 days? Pt message has been forwarded to you.

## 2020-06-24 MED ORDER — RABEPRAZOLE SODIUM 20 MG PO TBEC
DELAYED_RELEASE_TABLET | ORAL | 1 refills | Status: DC
Start: 2020-06-24 — End: 2020-11-27

## 2020-06-24 NOTE — Telephone Encounter (Signed)
rx sent in for aciphex (#180 with one refill).  Per review of chart, it appears that synthroid has already been sent in (05/2020).

## 2020-06-24 NOTE — Telephone Encounter (Signed)
rx sent in for aciphex.  Synthroid rx already addressed.

## 2020-06-24 NOTE — Addendum Note (Signed)
Addended by: Alisa Graff on: 06/24/2020 10:54 AM   Modules accepted: Orders

## 2020-06-29 ENCOUNTER — Other Ambulatory Visit: Payer: Self-pay | Admitting: Internal Medicine

## 2020-06-29 NOTE — Progress Notes (Signed)
Taylor-please inform patient that I would recommend IV iron infusion to keep the hemoglobin up.  Please inform that I will try to reach her later in the afternoon.  C-schedule for Venofer next week;  Follow-up in 6 months-MD labs-CBC/BMP; iron studies ferritin; possible Venofer.

## 2020-06-29 NOTE — Progress Notes (Signed)
Spoke to patient in regards to note below. She expressed understanding and is aware you will reach out to her later.

## 2020-07-03 DIAGNOSIS — I872 Venous insufficiency (chronic) (peripheral): Secondary | ICD-10-CM | POA: Diagnosis not present

## 2020-07-03 DIAGNOSIS — R001 Bradycardia, unspecified: Secondary | ICD-10-CM | POA: Diagnosis not present

## 2020-07-03 DIAGNOSIS — E7800 Pure hypercholesterolemia, unspecified: Secondary | ICD-10-CM | POA: Diagnosis not present

## 2020-07-03 DIAGNOSIS — R0600 Dyspnea, unspecified: Secondary | ICD-10-CM | POA: Diagnosis not present

## 2020-07-03 DIAGNOSIS — I5032 Chronic diastolic (congestive) heart failure: Secondary | ICD-10-CM | POA: Diagnosis not present

## 2020-07-03 DIAGNOSIS — I10 Essential (primary) hypertension: Secondary | ICD-10-CM | POA: Diagnosis not present

## 2020-07-03 DIAGNOSIS — I890 Lymphedema, not elsewhere classified: Secondary | ICD-10-CM | POA: Diagnosis not present

## 2020-07-03 DIAGNOSIS — K551 Chronic vascular disorders of intestine: Secondary | ICD-10-CM | POA: Diagnosis not present

## 2020-07-03 DIAGNOSIS — I340 Nonrheumatic mitral (valve) insufficiency: Secondary | ICD-10-CM | POA: Diagnosis not present

## 2020-07-03 DIAGNOSIS — I350 Nonrheumatic aortic (valve) stenosis: Secondary | ICD-10-CM | POA: Diagnosis not present

## 2020-07-03 DIAGNOSIS — I272 Pulmonary hypertension, unspecified: Secondary | ICD-10-CM | POA: Diagnosis not present

## 2020-07-03 DIAGNOSIS — Z9889 Other specified postprocedural states: Secondary | ICD-10-CM | POA: Diagnosis not present

## 2020-07-07 ENCOUNTER — Encounter: Payer: Self-pay | Admitting: Critical Care Medicine

## 2020-07-07 ENCOUNTER — Other Ambulatory Visit: Payer: Self-pay

## 2020-07-07 ENCOUNTER — Ambulatory Visit: Payer: PPO | Admitting: Critical Care Medicine

## 2020-07-07 VITALS — BP 136/84 | HR 66 | Temp 98.2°F | Ht 62.0 in | Wt 176.4 lb

## 2020-07-07 DIAGNOSIS — Z8616 Personal history of COVID-19: Secondary | ICD-10-CM

## 2020-07-07 DIAGNOSIS — J4530 Mild persistent asthma, uncomplicated: Secondary | ICD-10-CM | POA: Diagnosis not present

## 2020-07-07 MED ORDER — BUDESONIDE-FORMOTEROL FUMARATE 80-4.5 MCG/ACT IN AERO: 2.0000 | INHALATION_SPRAY | Freq: Two times a day (BID) | RESPIRATORY_TRACT | 12 refills | Status: DC

## 2020-07-07 NOTE — Patient Instructions (Addendum)
Thank you for visiting Dr. Carlis Abbott at [facility]. We recommend the following:   Meds ordered this encounter  Medications  . budesonide-formoterol (SYMBICORT) 80-4.5 MCG/ACT inhaler    Sig: Inhale 2 puffs into the lungs 2 (two) times daily.    Dispense:  1 each    Refill:  12    Return in about 3 months (around 10/07/2020). with Dr. Erin Fulling (30 minutes visit).    Please do your part to reduce the spread of COVID-19.

## 2020-07-07 NOTE — Progress Notes (Signed)
Synopsis: Referred in April 2021 for post- covid by Einar Pheasant, MD.  Subjective:   PATIENT ID: Kimberly Roy GENDER: female DOB: [birth year], MRN: [id number]  Chief Complaint  Patient presents with  . Follow-up    breathing improved since last visit     Kimberly Roy is an 86 year old woman who presents for follow-up of shortness of breath and chronic respiratory failure post Covid.  Since starting Symbicort she has noticed an improvement in her symptoms, but she is not back to her baseline.  She continues to use 2 L of oxygen when sleeping.  She denies coughing, wheezing, sputum production.  Follow-up with NP Parrett on 01/17/2020-notes reviewed.  She has a history of chronic HFpEF, NYHA class III, mild aortic stenosis, pulmonary hypertension.  She followed up with her Dr. Saralyn Pilar, her cardiologist at [facility] on Monday due to concern for severe bradycardic episodes that may be precipitating some of her symptoms.  She has recorded heart rates in the 30s at times.  Her metoprolol was decreased.  She had a Holter monitor in place after her visit Monday that she sent back today.  She is up-to-date on her flu shot and Covid vaccines.  She is scheduled for Covid booster at the end of the month.    OV 12/10/19: [name] is an 86 y/o woman who presents for evaluation of chronic dyspnea since having Covid in January 2021. She is accompanied by her daughter Kimberly Roy. During the acute phase of the illness she had headache, shortness of breath, lost of taste, severe fatigue.  Her symptoms have improved somewhat, but she has persistent shortness of breath.  She has been on chronic supplemental oxygen at night at 2 L for many years, but she is unsure why.  Due to back pain she sleeps in recliner.  In the last few months when she wakes up in the morning she will walk across her living room to check her saturations and she is often in the upper 70s or early 70s.  There is sometimes quite a bit of fluctuation.  Her  saturations will drop throughout the day when she is walking around her house.  She has to walk with a walker and WC due to limited mobility, which pre-dates covid.  Prior to Covid she felt that her breathing was fairly normal.  She initially was having wheezing and was prescribed an albuterol inhaler about 6 weeks ago, but her wheezing has mostly improved.  She continues to use her albuterol inhaler most days, notes that it does make her breathing feel better.  She has not been prescribed inhalers in the past.  She has a dry cough; she previously had sputum production which improved with antibiotics that she took last month.  Her cough is worsened again since the antibiotics were stopped.  No fever, chills, sweats, chest pain.  She has chronic heartburn and leg edema which are at baseline.  She has no previous history of asthma or COPD.    Past Medical History:  Diagnosis Date  . Anemia   . Anxiety   . Chest pain   . CHF (congestive heart failure) ([place])   . Constipation   . DDD (degenerative disc disease), cervical   . Depression   . DVT (deep venous thrombosis) ([place])   . Dysphonia   . Dyspnea   . Fatty liver   . Fatty liver   . Headache   . Hyperlipidemia   . Hyperpiesia   . Hypertension   .  Hypothyroidism   . Interstitial cystitis   . Left ventricular dysfunction   . Lymphedema   . Nephrolithiasis   . Obstructive sleep apnea   . Osteoarthritis    knees/cervical and lumbar spine  . Pulmonary hypertension ([place])   . Pulmonary nodules    followed by Dr Raul Del  . Pure hypercholesterolemia   . Renal cyst    right     Family History  Problem Relation Age of Onset  . Heart disease Mother   . Stroke Mother   . Hypertension Mother   . Heart disease Father        myocardial infarction age 40  . Breast cancer Neg Hx      Past Surgical History:  Procedure Laterality Date  . ABDOMINAL HYSTERECTOMY     ovaries left in place  . APPENDECTOMY    . Back Surgeries    . BACK  SURGERY    . BREAST REDUCTION SURGERY     3/99  . CARDIAC CATHETERIZATION    . cataracts Bilateral   . CERVICAL SPINE SURGERY    . ESOPHAGEAL MANOMETRY N/A 08/02/2015   Procedure: ESOPHAGEAL MANOMETRY (EM);  Surgeon: Josefine Class, MD;  Location: [facility] ENDOSCOPY;  Service: Endoscopy;  Laterality: N/A;  . ESOPHAGOGASTRODUODENOSCOPY N/A 02/27/2015   Procedure: ESOPHAGOGASTRODUODENOSCOPY (EGD);  Surgeon: Hulen Luster, MD;  Location: [facility] ENDOSCOPY;  Service: Gastroenterology;  Laterality: N/A;  . ESOPHAGOGASTRODUODENOSCOPY (EGD) WITH PROPOFOL N/A 10/30/2018   Procedure: ESOPHAGOGASTRODUODENOSCOPY (EGD) WITH PROPOFOL;  Surgeon: Lollie Sails, MD;  Location: [facility] ENDOSCOPY;  Service: Endoscopy;  Laterality: N/A;  . EXCISIONAL HEMORRHOIDECTOMY    . EYE SURGERY    . FRACTURE SURGERY    . HEMORRHOID SURGERY    . HIP SURGERY  2013   Right hip surgery  . JOINT REPLACEMENT    . KNEE ARTHROSCOPY     left and right  . ORIF FEMUR FRACTURE Right 08/07/2018   Procedure: OPEN REDUCTION INTERNAL FIXATION (ORIF) DISTAL FEMUR FRACTURE;  Surgeon: Hessie Knows, MD;  Location: [facility] ORS;  Service: Orthopedics;  Laterality: Right;  . REDUCTION MAMMAPLASTY Bilateral YRS AGO  . REPLACEMENT TOTAL KNEE Bilateral   . rotator cuff surgery     blilateral  . TONSILECTOMY/ADENOIDECTOMY WITH MYRINGOTOMY    . VISCERAL ARTERY INTERVENTION N/A 02/25/2019   Procedure: VISCERAL ARTERY INTERVENTION;  Surgeon: Algernon Huxley, MD;  Location: [place] [facility];  Service: Cardiovascular;  Laterality: N/A;    Social History   Socioeconomic History  . Marital status: Widowed    Spouse name: Not on file  . Number of children: 2  . Years of education: Not on file  . Highest education level: Not on file  Occupational History    Comment: insurance agency  Tobacco Use  . Smoking status: Never Smoker  . Smokeless tobacco: Never Used  Vaping Use  . Vaping Use: Never used  Substance and Sexual Activity  . Alcohol use:  No    Alcohol/week: 0.0 standard drinks  . Drug use: Never  . Sexual activity: Not Currently  Other Topics Concern  . Not on file  Social History Narrative  . Not on file   Social Determinants of Health   Financial Resource Strain:   . Difficulty of Paying Living Expenses: Not on file  Food Insecurity:   . Worried About Charity fundraiser in the Last Year: Not on file  . Ran Out of Food in the Last Year: Not on file  Transportation Needs:   .  Lack of Transportation (Medical): Not on file  . Lack of Transportation (Non-Medical): Not on file  Physical Activity:   . Days of Exercise per Week: Not on file  . Minutes of Exercise per Session: Not on file  Stress:   . Feeling of Stress : Not on file  Social Connections:   . Frequency of Communication with Friends and Family: Not on file  . Frequency of Social Gatherings with Friends and Family: Not on file  . Attends Religious Services: Not on file  . Active Member of Clubs or Organizations: Not on file  . Attends Archivist Meetings: Not on file  . Marital Status: Not on file  Intimate Partner Violence:   . Fear of Current or Ex-Partner: Not on file  . Emotionally Abused: Not on file  . Physically Abused: Not on file  . Sexually Abused: Not on file     Allergies  Allergen Reactions  . Lyrica [Pregabalin] Swelling  . Omnicef [Cefdinir] Diarrhea and Nausea And Vomiting  . Atarax [Hydroxyzine]     jittery  . Dicyclomine Other (See Comments)    Abdominal bloating   . Hydroxyzine Hcl     jittery  . Levaquin [Levofloxacin] Swelling  . Nitrofurantoin Diarrhea  . Nucynta Er [Tapentadol Hcl Er] Other (See Comments)    Severe constipation   . Oxybutynin Other (See Comments)    Blurred vision  . Zoloft [Sertraline Hcl]     Severe headache  . Biaxin [Clarithromycin] Other (See Comments) and Rash    Pt does not remember Pt does not remember  . Sertraline Nausea And Vomiting    Severe headache Severe  headache Other reaction(s): Headache Severe headache  . Sulfa Antibiotics Rash    Pt does not remember  . Sulfasalazine Rash    Pt does not remember  . Tape Rash    [place] - redness  . Tapentadol Other (See Comments) and Rash    _0      Immunization History  Administered Date(s) Administered  . Fluad Quad(high Dose 65+) 05/31/2020  . Influenza Split 07/13/2009, 07/03/2012  . Influenza, High Dose Seasonal PF 05/13/2016, 06/11/2017, 06/05/2018, 06/07/2019  . Influenza,inj,Quad PF,6+ Mos 05/25/2013, 05/31/2014, 04/27/2015  . PFIZER SARS-COV-2 Vaccination 12/16/2019, 01/14/2020  . Pneumococcal Conjugate-13 11/29/2014  . Pneumococcal-Unspecified 06/22/2001  . Tdap 09/17/2017  . Zoster Recombinat (Shingrix) 06/07/2019    Outpatient Medications Prior to Visit  Medication Sig Dispense Refill  . albuterol (VENTOLIN HFA) 108 (90 Base) MCG/ACT inhaler INHALE 2 PUFFS INTO THE LUNGS EVERY 6 (SIX) HOURS AS NEEDED FOR WHEEZING OR SHORTNESS OF BREATH 18 g 0  . aluminum-magnesium hydroxide-simethicone (MAALOX) 308-569-43 MG/5ML SUSP Take 15 mLs by mouth 4 (four) times daily -  before meals and at bedtime.     Marland Kitchen amLODipine (NORVASC) 10 MG tablet TAKE 1 TABLET BY MOUTH DAILY 90 tablet 1  . atorvastatin (LIPITOR) 20 MG tablet TAKE 1 TABLET BY MOUTH AT BEDTIME 90 tablet 1  . azelastine (ASTELIN) 0.1 % nasal spray PLACE 1 SPRAY INTO BOTH NOSTRILS TWO TIMES DAILY 30 mL 1  . benazepril (LOTENSIN) 40 MG tablet TAKE 1 TABLET BY MOUTH DAILY 90 tablet 1  . busPIRone (BUSPAR) 10 MG tablet Take 10 mg by mouth 2 (two) times daily.     . calcium citrate-vitamin D (CITRACAL+D) 315-200 MG-UNIT tablet Take 1 tablet by mouth daily.    . Cholecalciferol (VITAMIN D3) 1000 UNITS CAPS Take by mouth.    Marland Kitchen  DULoxetine (CYMBALTA) 60 MG capsule TAKE 1 CAPSULE BY MOUTH EVERY DAY. 30 capsule 3  . fluticasone (FLONASE) 50  MCG/ACT nasal spray Place 2 sprays into both nostrils daily. 16 g 1  . furosemide (LASIX) 20 MG tablet TAKE 1 TABLET BY MOUTH EVERY DAY AS NEEDED 90 tablet 0  . gabapentin (NEURONTIN) 300 MG capsule TAKE 2 CAPSULES BY MOUTH 3 TIMES A DAY. 540 capsule 2  . hydrochlorothiazide (MICROZIDE) 12.5 MG capsule Take 12.5 mg by mouth daily.    Marland Kitchen HYDROcodone-acetaminophen (NORCO) 10-325 MG tablet Take 1 tablet by mouth 3 (three) times daily. 10 tablet 0  . magnesium oxide (MAG-OX) 400 MG tablet Take 1 tablet (400 mg total) by mouth daily. 30 tablet 1  . metoprolol succinate (TOPROL-XL) 100 MG 24 hr tablet TAKE 1 TABLET BY MOUTH TWICE A DAY WITH OR IMMEDIATELY FOLLOWING A MEAL. (Patient taking differently: Take 50 mg by mouth in the morning and at bedtime. ) 180 tablet 1  . morphine (MS CONTIN) 60 MG 12 hr tablet Take 1 tablet (60 mg total) by mouth 2 (two) times daily. 10 tablet 0  . Naldemedine Tosylate (SYMPROIC) 0.2 MG TABS Take 0.2 mg by mouth daily.    . polyethylene glycol powder (GLYCOLAX/MIRALAX) 17 GM/SCOOP powder MIX 17 GRAMS AS MARKED ON BOTTLE TOP IN 8 OUNCES OF WATER AND DRINK ONCE A DAY AS DIRECTED. 527 g 0  . RABEprazole (ACIPHEX) 20 MG tablet TAKE 1 TABLET BY MOUTH 2 TIMES DAILY BEFORE A MEAL 180 tablet 1  . senna (SENOKOT) 8.6 MG TABS tablet Take 1 tablet by mouth 2 (two) times daily.    . Simethicone (GAS-X PO) Take 2 tablets by mouth 2 (two) times daily.    . sodium chloride (OCEAN) 0.65 % nasal spray Place 1 spray into the nose as needed.    Marland Kitchen SYNTHROID 100 MCG tablet TAKE 1 TABLET BY MOUTH DAILY 90 tablet 3  . traZODone (DESYREL) 50 MG tablet Take 25-50 mg by mouth at bedtime as needed.     No facility-administered medications prior to visit.    Review of Systems  Constitutional: Negative for chills and fever.  HENT: Negative for congestion.   Respiratory: Positive for cough and shortness of breath. Negative for sputum production and wheezing.   Cardiovascular: Negative for chest  pain and leg swelling.  Gastrointestinal: Negative for heartburn, nausea and vomiting.     Objective:   Vitals:   07/07/20 1431  BP: 136/84  Pulse: 66  Temp: 98.2 F (36.8 C)  SpO2: 98%  Weight: 176 lb 6.4 oz (80 kg)  Height: 5' 2" (1.575 m)   98% on   RA BMI Readings from Last 3 Encounters:  07/07/20 32.26 kg/m  06/08/20 31.28 kg/m  04/07/20 32.37 kg/m   Wt Readings from Last 3 Encounters:  07/07/20 176 lb 6.4 oz (80 kg)  06/08/20 171 lb (77.6 kg)  04/07/20 177 lb (80.3 kg)    Physical Exam Vitals reviewed.  Constitutional:      Appearance: She is obese.     Comments: Chronically ill-appearing  HENT:     Head: Normocephalic and atraumatic.  Eyes:     General: No scleral icterus. Cardiovascular:     Rate and Rhythm: Normal rate and regular rhythm.     Heart sounds: No murmur heard.   Pulmonary:     Comments: Breathing comfortably on room air, no conversational dyspnea.  Clear to auscultation bilaterally. Musculoskeletal:  General: Swelling present. No deformity.     Cervical back: Neck supple.     Comments: Compression stockings on LEs.  Significant kyphosis.  Lymphadenopathy:     Cervical: No cervical adenopathy.  Skin:    General: Skin is warm and dry.     Findings: No rash.  Neurological:     Mental Status: She is alert.     Coordination: Coordination normal.     Comments: Ambulates with a walker  Psychiatric:        Mood and Affect: Mood normal.        Behavior: Behavior normal.      CBC    Component Value Date/Time   WBC 7.9 06/23/2020 1123   RBC 3.27 (L) 06/23/2020 1123   HGB 10.5 (L) 06/23/2020 1123   HGB 10.4 (L) 10/29/2019 0000   HCT 32.8 (L) 06/23/2020 1123   HCT 30.8 (L) 10/29/2019 0000   PLT 147 (L) 06/23/2020 1123   PLT 284 10/29/2019 0000   MCV 100.3 (H) 06/23/2020 1123   MCV 95 10/29/2019 0000   MCV 94 04/18/2014 0944   MCH 32.1 06/23/2020 1123   MCHC 32.0 06/23/2020 1123   RDW 14.6 06/23/2020 1123   RDW 12.5  10/29/2019 0000   RDW 14.8 (H) 04/18/2014 0944   LYMPHSABS 2.0 06/23/2020 1123   LYMPHSABS 2.0 10/29/2019 0000   LYMPHSABS 2.4 04/18/2014 0944   MONOABS 0.6 06/23/2020 1123   MONOABS 0.6 04/18/2014 0944   EOSABS 0.3 06/23/2020 1123   EOSABS 0.3 10/29/2019 0000   EOSABS 0.4 04/18/2014 0944   BASOSABS 0.0 06/23/2020 1123   BASOSABS 0.0 10/29/2019 0000   BASOSABS 0.0 04/18/2014 0944    CHEMISTRY No results for input(s): NA, K, CL, CO2, GLUCOSE, BUN, CREATININE, CALCIUM, MG, PHOS in the last 168 hours. Estimated Creatinine Clearance: 39.2 mL/min (by C-G formula based on SCr of 0.99 mg/dL).   Chest Imaging- films reviewed: CXR 1 view 10/29/2019-cardiomegaly, increased lung markings bilaterally  HRCT chest 12/29/19-mosaicism in upper lobes, mild airway thickening.  Peripheral pulmonary nodules-unchanged.  No fibrosis.  No significant mediastinal or hilar adenopathy.  Patulous esophagus.  Small dependent bilateral pleural effusions.  Pulmonary Functions Testing Results: PFT Results Latest Ref Rng & Units 01/17/2020  FVC-Pre L 1.71  FVC-Predicted Pre % 81  FVC-Post L 1.71  FVC-Predicted Post % 81  Pre FEV1/FVC % % 80  Post FEV1/FCV % % 83  FEV1-Pre L 1.36  FEV1-Predicted Pre % 88  FEV1-Post L 1.42  DLCO uncorrected ml/min/mmHg 11.24  DLCO UNC% % 65  DLCO corrected ml/min/mmHg 12.35  DLCO COR %Predicted % 71  DLVA Predicted % 100  TLC L 3.62  TLC % Predicted % 76  RV % Predicted % 81   2021-no significant obstruction or bronchodilator reversibility.  Mild restriction, mild diffusion impairment.  Flow volume loop suggests obstruction.     Assessment & Plan:     ICD-10-CM   1. History of COVID-19  Z86.16   2. Mild persistent asthma without complication  U31.49     Chronic DOE with wheezing and cough since covid 19 viral pneumonia-improved since starting Symbicort, but no obstruction on PFTs.  Mild restriction on PFTs likely attributable to severe kyphosis.  No evidence of ILD  on CT. -De-escalate to low-dose Symbicort to minimize the risk of side effects.  If she notices that she is having worsened pulmonary symptoms she should reescalate to the higher dose and let us know.  If she is doing well at  follow-up could consider de-escalating to ICS monotherapy. -Albuterol as needed  Chronic hypoxic respiratory failure -Continue supplemental oxygen use as prescribed.   RTC in 3 months with Dr. Erin Fulling.   Current Outpatient Medications:  .  albuterol (VENTOLIN HFA) 108 (90 Base) MCG/ACT inhaler, INHALE 2 PUFFS INTO THE LUNGS EVERY 6 (SIX) HOURS AS NEEDED FOR WHEEZING OR SHORTNESS OF BREATH, Disp: 18 g, Rfl: 0 .  aluminum-magnesium hydroxide-simethicone (MAALOX) 119-417-40 MG/5ML SUSP, Take 15 mLs by mouth 4 (four) times daily -  before meals and at bedtime. , Disp: , Rfl:  .  amLODipine (NORVASC) 10 MG tablet, TAKE 1 TABLET BY MOUTH DAILY, Disp: 90 tablet, Rfl: 1 .  atorvastatin (LIPITOR) 20 MG tablet, TAKE 1 TABLET BY MOUTH AT BEDTIME, Disp: 90 tablet, Rfl: 1 .  azelastine (ASTELIN) 0.1 % nasal spray, PLACE 1 SPRAY INTO BOTH NOSTRILS TWO TIMES DAILY, Disp: 30 mL, Rfl: 1 .  benazepril (LOTENSIN) 40 MG tablet, TAKE 1 TABLET BY MOUTH DAILY, Disp: 90 tablet, Rfl: 1 .  busPIRone (BUSPAR) 10 MG tablet, Take 10 mg by mouth 2 (two) times daily. , Disp: , Rfl:  .  calcium citrate-vitamin D (CITRACAL+D) 315-200 MG-UNIT tablet, Take 1 tablet by mouth daily., Disp: , Rfl:  .  Cholecalciferol (VITAMIN D3) 1000 UNITS CAPS, Take by mouth., Disp: , Rfl:  .  DULoxetine (CYMBALTA) 60 MG capsule, TAKE 1 CAPSULE BY MOUTH EVERY DAY., Disp: 30 capsule, Rfl: 3 .  fluticasone (FLONASE) 50 MCG/ACT nasal spray, Place 2 sprays into both nostrils daily., Disp: 16 g, Rfl: 1 .  furosemide (LASIX) 20 MG tablet, TAKE 1 TABLET BY MOUTH EVERY DAY AS NEEDED, Disp: 90 tablet, Rfl: 0 .  gabapentin (NEURONTIN) 300 MG capsule, TAKE 2 CAPSULES BY MOUTH 3 TIMES A DAY., Disp: 540 capsule, Rfl: 2 .   hydrochlorothiazide (MICROZIDE) 12.5 MG capsule, Take 12.5 mg by mouth daily., Disp: , Rfl:  .  HYDROcodone-acetaminophen (NORCO) 10-325 MG tablet, Take 1 tablet by mouth 3 (three) times daily., Disp: 10 tablet, Rfl: 0 .  magnesium oxide (MAG-OX) 400 MG tablet, Take 1 tablet (400 mg total) by mouth daily., Disp: 30 tablet, Rfl: 1 .  metoprolol succinate (TOPROL-XL) 100 MG 24 hr tablet, TAKE 1 TABLET BY MOUTH TWICE A DAY WITH OR IMMEDIATELY FOLLOWING A MEAL. (Patient taking differently: Take 50 mg by mouth in the morning and at bedtime. ), Disp: 180 tablet, Rfl: 1 .  morphine (MS CONTIN) 60 MG 12 hr tablet, Take 1 tablet (60 mg total) by mouth 2 (two) times daily., Disp: 10 tablet, Rfl: 0 .  Naldemedine Tosylate (SYMPROIC) 0.2 MG TABS, Take 0.2 mg by mouth daily., Disp: , Rfl:  .  polyethylene glycol powder (GLYCOLAX/MIRALAX) 17 GM/SCOOP powder, MIX 17 GRAMS AS MARKED ON BOTTLE TOP IN 8 OUNCES OF WATER AND DRINK ONCE A DAY AS DIRECTED., Disp: 527 g, Rfl: 0 .  RABEprazole (ACIPHEX) 20 MG tablet, TAKE 1 TABLET BY MOUTH 2 TIMES DAILY BEFORE A MEAL, Disp: 180 tablet, Rfl: 1 .  senna (SENOKOT) 8.6 MG TABS tablet, Take 1 tablet by mouth 2 (two) times daily., Disp: , Rfl:  .  Simethicone (GAS-X PO), Take 2 tablets by mouth 2 (two) times daily., Disp: , Rfl:  .  sodium chloride (OCEAN) 0.65 % nasal spray, Place 1 spray into the nose as needed., Disp: , Rfl:  .  SYNTHROID 100 MCG tablet, TAKE 1 TABLET BY MOUTH DAILY, Disp: 90 tablet, Rfl: 3 .  traZODone (DESYREL) 50  MG tablet, Take 25-50 mg by mouth at bedtime as needed., Disp: , Rfl:  .  budesonide-formoterol (SYMBICORT) 80-4.5 MCG/ACT inhaler, Inhale 2 puffs into the lungs 2 (two) times daily., Disp: 1 each, Rfl: [place] Lancer Thurner, DO [facility] 07/07/2020 3:05 PM

## 2020-07-11 ENCOUNTER — Other Ambulatory Visit: Payer: Self-pay | Admitting: Internal Medicine

## 2020-07-13 ENCOUNTER — Inpatient Hospital Stay: Payer: PPO | Attending: Internal Medicine

## 2020-07-13 ENCOUNTER — Other Ambulatory Visit: Payer: Self-pay

## 2020-07-13 VITALS — BP 150/60 | HR 51 | Temp 97.0°F

## 2020-07-13 DIAGNOSIS — D649 Anemia, unspecified: Secondary | ICD-10-CM | POA: Insufficient documentation

## 2020-07-13 DIAGNOSIS — D500 Iron deficiency anemia secondary to blood loss (chronic): Secondary | ICD-10-CM

## 2020-07-13 MED ORDER — IRON SUCROSE 20 MG/ML IV SOLN
200.0000 mg | Freq: Once | INTRAVENOUS | Status: AC
  Administered 2020-07-13: 200 mg via INTRAVENOUS
  Filled 2020-07-13: qty 10

## 2020-07-13 MED ORDER — SODIUM CHLORIDE 0.9 % IV SOLN
Freq: Once | INTRAVENOUS | Status: AC
  Filled 2020-07-13: qty 250

## 2020-07-13 MED ORDER — SODIUM CHLORIDE 0.9 % IV SOLN: 200.0000 mg | Freq: Once | INTRAVENOUS | Status: DC

## 2020-07-13 NOTE — Progress Notes (Signed)
Pt tolerated venofer infusion well with no signs of complications. RN educated pt on the importance of notifying the clinic if any complications occur. Pt verbalized understanding, and all questions at this time. VSS. Pt stable for discharge. Pt discharged home with daughter.   Kimberly Roy [employer]

## 2020-07-19 DIAGNOSIS — R001 Bradycardia, unspecified: Secondary | ICD-10-CM | POA: Diagnosis not present

## 2020-07-20 DIAGNOSIS — R001 Bradycardia, unspecified: Secondary | ICD-10-CM | POA: Diagnosis not present

## 2020-07-20 DIAGNOSIS — Z9889 Other specified postprocedural states: Secondary | ICD-10-CM | POA: Diagnosis not present

## 2020-07-20 DIAGNOSIS — I10 Essential (primary) hypertension: Secondary | ICD-10-CM | POA: Diagnosis not present

## 2020-07-20 DIAGNOSIS — I5032 Chronic diastolic (congestive) heart failure: Secondary | ICD-10-CM | POA: Diagnosis not present

## 2020-07-20 DIAGNOSIS — E7800 Pure hypercholesterolemia, unspecified: Secondary | ICD-10-CM | POA: Diagnosis not present

## 2020-07-20 DIAGNOSIS — I272 Pulmonary hypertension, unspecified: Secondary | ICD-10-CM | POA: Diagnosis not present

## 2020-07-20 DIAGNOSIS — R0600 Dyspnea, unspecified: Secondary | ICD-10-CM | POA: Diagnosis not present

## 2020-07-24 DIAGNOSIS — Z961 Presence of intraocular lens: Secondary | ICD-10-CM | POA: Diagnosis not present

## 2020-08-11 ENCOUNTER — Ambulatory Visit (INDEPENDENT_AMBULATORY_CARE_PROVIDER_SITE_OTHER): Payer: PPO | Admitting: Internal Medicine

## 2020-08-11 ENCOUNTER — Encounter: Payer: Self-pay | Admitting: Internal Medicine

## 2020-08-11 ENCOUNTER — Other Ambulatory Visit: Payer: Self-pay

## 2020-08-11 VITALS — BP 128/60 | HR 105 | Temp 98.2°F | Ht 62.01 in | Wt 180.6 lb

## 2020-08-11 DIAGNOSIS — I509 Heart failure, unspecified: Secondary | ICD-10-CM

## 2020-08-11 DIAGNOSIS — F439 Reaction to severe stress, unspecified: Secondary | ICD-10-CM | POA: Diagnosis not present

## 2020-08-11 DIAGNOSIS — D649 Anemia, unspecified: Secondary | ICD-10-CM | POA: Diagnosis not present

## 2020-08-11 DIAGNOSIS — I10 Essential (primary) hypertension: Secondary | ICD-10-CM | POA: Diagnosis not present

## 2020-08-11 DIAGNOSIS — M7989 Other specified soft tissue disorders: Secondary | ICD-10-CM

## 2020-08-11 DIAGNOSIS — G629 Polyneuropathy, unspecified: Secondary | ICD-10-CM

## 2020-08-11 DIAGNOSIS — E785 Hyperlipidemia, unspecified: Secondary | ICD-10-CM | POA: Diagnosis not present

## 2020-08-11 DIAGNOSIS — E039 Hypothyroidism, unspecified: Secondary | ICD-10-CM | POA: Diagnosis not present

## 2020-08-11 DIAGNOSIS — I272 Pulmonary hypertension, unspecified: Secondary | ICD-10-CM

## 2020-08-11 DIAGNOSIS — G4733 Obstructive sleep apnea (adult) (pediatric): Secondary | ICD-10-CM | POA: Diagnosis not present

## 2020-08-11 DIAGNOSIS — K219 Gastro-esophageal reflux disease without esophagitis: Secondary | ICD-10-CM | POA: Diagnosis not present

## 2020-08-11 LAB — BASIC METABOLIC PANEL
BUN: 23 mg/dL (ref 6–23)
CO2: 31 mEq/L (ref 19–32)
Calcium: 9.3 mg/dL (ref 8.4–10.5)
Chloride: 100 mEq/L (ref 96–112)
Creatinine, Ser: 0.99 mg/dL (ref 0.40–1.20)
GFR: 51.29 mL/min — ABNORMAL LOW (ref >60.00)
Glucose, Bld: 83 mg/dL (ref 70–99)
Potassium: 4.1 mEq/L (ref 3.5–5.1)
Sodium: 139 mEq/L (ref 135–145)

## 2020-08-11 LAB — LIPID PANEL
Cholesterol: 130 mg/dL (ref 0–200)
HDL: 48.3 mg/dL (ref >39.00)
LDL Cholesterol: 66 mg/dL (ref 0–99)
NonHDL: 81.66
Total CHOL/HDL Ratio: 3
Triglycerides: 80 mg/dL (ref 0.0–149.0)
VLDL: 16 mg/dL (ref 0.0–40.0)

## 2020-08-11 LAB — HEPATIC FUNCTION PANEL
ALT: 10 U/L (ref 0–35)
AST: 17 U/L (ref 0–37)
Albumin: 3.9 g/dL (ref 3.5–5.2)
Alkaline Phosphatase: 84 U/L (ref 39–117)
Bilirubin, Direct: 0.1 mg/dL (ref 0.0–0.3)
Total Bilirubin: 0.5 mg/dL (ref 0.2–1.2)
Total Protein: 6.3 g/dL (ref 6.0–8.3)

## 2020-08-11 LAB — TSH: TSH: 1.17 u[IU]/mL (ref 0.35–4.50)

## 2020-08-11 NOTE — Progress Notes (Signed)
Patient ID: Kimberly Roy, female   DOB: [birth year], 86 y.o.   MRN: [id number]   Subjective:    Patient ID: Kimberly Roy, female    DOB: 20-Feb-1931, 86 y.o.   MRN: [id number]  HPI This visit occurred during the SARS-CoV-2 public health emergency.  Safety protocols were in place, including screening questions prior to the visit, additional usage of staff PPE, and extensive cleaning of exam room while observing appropriate contact time as indicated for disinfecting solutions.  Patient here for a scheduled follow up.  Here to follow up regarding her blood pressure and cholesterol.  She is doing well.  Feels better. Recently metoprolol was decreased. She is now taking 46m q day.  Feels like she has more energy.  Getting around her house better.  Seeing pulmonary for f/u of sob and chronic respiratory failure post covid.  Per pulmonary, was started on symbicort.  She reports she is no longer taking symbicort.  Breathing is doing better.  No increased sob reported.  No abdominal pain.  Bowels stable.  Handling stress.  Seeing psychiatry.  Stable.   Past Medical History:  Diagnosis Date  . Anemia   . Anxiety   . Chest pain   . [place] ([place]estive heart failure) [place]   . Constipation   . DDD (degenerative disc disease), cervical   . Depression   . DVT (deep venous thrombosis) ([place]   . Dysphonia   . Dyspnea   . Fatty liver   . Fatty liver   . Headache   . Hyperlipidemia   . Hyperpiesia   . Hypertension   . Hypothyroidism   . Interstitial cystitis   . Left ventricular dysfunction   . Lymphedema   . Nephrolithiasis   . Obstructive sleep apnea   . Osteoarthritis    knees/cervical and lumbar spine  . Pulmonary hypertension ([place]   . Pulmonary nodules    followed by Dr FRaul Del . Pure hypercholesterolemia   . Renal cyst    right   Past Surgical History:  Procedure Laterality Date  . ABDOMINAL HYSTERECTOMY     ovaries left in place  . APPENDECTOMY    . Back Surgeries    . BACK SURGERY    .  BREAST REDUCTION SURGERY     3/99  . CARDIAC CATHETERIZATION    . cataracts Bilateral   . CERVICAL SPINE SURGERY    . ESOPHAGEAL MANOMETRY N/A 08/02/2015   Procedure: ESOPHAGEAL MANOMETRY (EM);  Surgeon: MJosefine Class MD;  [facility];  Service: Endoscopy;  Laterality: N/A;  . ESOPHAGOGASTRODUODENOSCOPY N/A 02/27/2015   Procedure: ESOPHAGOGASTRODUODENOSCOPY (EGD);  Surgeon: PHulen Luster MD;  [facility];  Service: Gastroenterology;  Laterality: N/A;  . ESOPHAGOGASTRODUODENOSCOPY (EGD) WITH PROPOFOL N/A 10/30/2018   Procedure: ESOPHAGOGASTRODUODENOSCOPY (EGD) WITH PROPOFOL;  Surgeon: SLollie Sails MD;  [facility];  Service: Endoscopy;  Laterality: N/A;  . EXCISIONAL HEMORRHOIDECTOMY    . EYE SURGERY    . FRACTURE SURGERY    . HEMORRHOID SURGERY    . HIP SURGERY  2013   Right hip surgery  . JOINT REPLACEMENT    . KNEE ARTHROSCOPY     left and right  . ORIF FEMUR FRACTURE Right 08/07/2018   Procedure: OPEN REDUCTION INTERNAL FIXATION (ORIF) DISTAL FEMUR FRACTURE;  Surgeon: MHessie Knows MD;  Location: [facility];  Service: Orthopedics;  Laterality: Right;  . REDUCTION MAMMAPLASTY Bilateral YRS AGO  . REPLACEMENT TOTAL KNEE Bilateral   . rotator cuff  surgery     blilateral  . TONSILECTOMY/ADENOIDECTOMY WITH MYRINGOTOMY    . VISCERAL ARTERY INTERVENTION N/A 02/25/2019   Procedure: VISCERAL ARTERY INTERVENTION;  Surgeon: Algernon Huxley, MD;  Location: [facility];  Service: Cardiovascular;  Laterality: N/A;   Family History  Problem Relation Age of Onset  . Heart disease Mother   . Stroke Mother   . Hypertension Mother   . Heart disease Father        myocardial infarction age 60  . Breast cancer Neg Hx    Social History   Socioeconomic History  . Marital status: Widowed    Spouse name: Not on file  . Number of children: 2  . Years of education: Not on file  . Highest education level: Not on file  Occupational History     Comment: insurance agency  Tobacco Use  . Smoking status: Never Smoker  . Smokeless tobacco: Never Used  Vaping Use  . Vaping Use: Never used  Substance and Sexual Activity  . Alcohol use: No    Alcohol/week: 0.0 standard drinks  . Drug use: Never  . Sexual activity: Not Currently  Other Topics Concern  . Not on file  Social History Narrative  . Not on file   Social Determinants of Health   Financial Resource Strain: Not on file  Food Insecurity: Not on file  Transportation Needs: Not on file  Physical Activity: Not on file  Stress: Not on file  Social Connections: Not on file    Outpatient Encounter Medications as of 08/11/2020  Medication Sig  . albuterol (VENTOLIN HFA) 108 (90 Base) MCG/ACT inhaler INHALE 2 PUFFS INTO THE LUNGS EVERY 6 (SIX) HOURS AS NEEDED FOR WHEEZING OR SHORTNESS OF BREATH  . aluminum-magnesium hydroxide-simethicone (MAALOX) 200-200-20 MG/5ML SUSP Take 15 mLs by mouth 4 (four) times daily -  before meals and at bedtime.   Marland Kitchen amLODipine (NORVASC) 10 MG tablet TAKE 1 TABLET BY MOUTH DAILY  . aspirin 81 MG EC tablet Take by mouth.  Marland Kitchen atorvastatin (LIPITOR) 20 MG tablet TAKE 1 TABLET BY MOUTH AT BEDTIME  . azelastine (ASTELIN) 0.1 % nasal spray PLACE 1 SPRAY INTO BOTH NOSTRILS TWO TIMES DAILY  . benazepril (LOTENSIN) 40 MG tablet TAKE 1 TABLET BY MOUTH DAILY  . budesonide-formoterol (SYMBICORT) 80-4.5 MCG/ACT inhaler Inhale 2 puffs into the lungs 2 (two) times daily.  . busPIRone (BUSPAR) 10 MG tablet Take 10 mg by mouth 2 (two) times daily.   . calcium citrate-vitamin D (CITRACAL+D) 315-200 MG-UNIT tablet Take 1 tablet by mouth daily.  . cephALEXin (KEFLEX) 250 MG capsule Take 250 mg by mouth daily.  . Cholecalciferol (VITAMIN D3) 1000 UNITS CAPS Take by mouth.  . DULoxetine (CYMBALTA) 60 MG capsule TAKE 1 CAPSULE BY MOUTH EVERY DAY.  . fluocinonide (LIDEX) 0.05 % external solution Apply topically.  . fluticasone (FLONASE) 50 MCG/ACT nasal spray Place 2  sprays into both nostrils daily.  . furosemide (LASIX) 20 MG tablet TAKE 1 TABLET BY MOUTH EVERY DAY AS NEEDED  . gabapentin (NEURONTIN) 300 MG capsule TAKE 2 CAPSULES BY MOUTH 3 TIMES A DAY.  . hydrochlorothiazide (MICROZIDE) 12.5 MG capsule Take 12.5 mg by mouth daily.  Marland Kitchen HYDROcodone-acetaminophen (NORCO) 10-325 MG tablet Take 1 tablet by mouth 3 (three) times daily.  Marland Kitchen ketoconazole (NIZORAL) 2 % shampoo Apply 1 application topically 2 (two) times a week.  . magnesium oxide (MAG-OX) 400 MG tablet Take 1 tablet (400 mg total) by mouth daily.  Marland Kitchen  metoprolol succinate (TOPROL-XL) 100 MG 24 hr tablet TAKE 1 TABLET BY MOUTH TWICE A DAY WITH OR IMMEDIATELY FOLLOWING A MEAL. (Patient taking differently: Take 50 mg by mouth in the morning and at bedtime.)  . morphine (MS CONTIN) 60 MG 12 hr tablet Take 1 tablet (60 mg total) by mouth 2 (two) times daily.  . Naldemedine Tosylate 0.2 MG TABS Take 0.2 mg by mouth daily.  . polyethylene glycol powder (GLYCOLAX/MIRALAX) 17 GM/SCOOP powder MIX 17 GRAMS AS MARKED ON BOTTLE TOP IN 8 OUNCES OF WATER AND DRINK ONCE A DAY AS DIRECTED.  . RABEprazole (ACIPHEX) 20 MG tablet TAKE 1 TABLET BY MOUTH 2 TIMES DAILY BEFORE A MEAL  . senna (SENOKOT) 8.6 MG TABS tablet Take 1 tablet by mouth 2 (two) times daily.  . Simethicone (GAS-X PO) Take 2 tablets by mouth 2 (two) times daily.  . sodium chloride (OCEAN) 0.65 % nasal spray Place 1 spray into the nose as needed.  Marland Kitchen SYNTHROID 100 MCG tablet TAKE 1 TABLET BY MOUTH DAILY  . traZODone (DESYREL) 50 MG tablet Take 25-50 mg by mouth at bedtime as needed.   No facility-administered encounter medications on file as of 08/11/2020.    Review of Systems  Constitutional: Negative for appetite change and unexpected weight change.       Energy better.   HENT: Negative for congestion and sinus pressure.   Respiratory: Negative for cough, chest tightness and shortness of breath.   Cardiovascular: Negative for chest pain,  palpitations and leg swelling.  Gastrointestinal: Negative for abdominal pain, diarrhea, nausea and vomiting.  Genitourinary: Negative for difficulty urinating and dysuria.  Musculoskeletal: Negative for joint swelling and myalgias.  Skin: Negative for color change and rash.  Neurological: Negative for dizziness, light-headedness and headaches.  Psychiatric/Behavioral: Negative for agitation and dysphoric mood.       Objective:    Physical Exam Vitals reviewed.  Constitutional:      General: She is not in acute distress.    Appearance: Normal appearance.  HENT:     Head: Normocephalic and atraumatic.     Right Ear: External ear normal.     Left Ear: External ear normal.     Mouth/Throat:     Mouth: Oropharynx is clear and moist.  Eyes:     General: No scleral icterus.       Right eye: No discharge.        Left eye: No discharge.     Conjunctiva/sclera: Conjunctivae normal.  Neck:     Thyroid: No thyromegaly.  Cardiovascular:     Rate and Rhythm: Normal rate and regular rhythm.  Pulmonary:     Effort: No respiratory distress.     Breath sounds: Normal breath sounds. No wheezing.  Abdominal:     General: Bowel sounds are normal.     Palpations: Abdomen is soft.     Tenderness: There is no abdominal tenderness.  Musculoskeletal:        General: No swelling, tenderness or edema.     Cervical back: Neck supple. No tenderness.  Lymphadenopathy:     Cervical: No cervical adenopathy.  Skin:    Findings: No erythema or rash.  Neurological:     Mental Status: She is alert.  Psychiatric:        Mood and Affect: Mood normal.        Behavior: Behavior normal.     BP 128/60   Pulse (!) 105   Temp 98.2 F (36.8 C)  Ht 5' 2.01" (1.575 m)   Wt 180 lb 9.6 oz (81.9 kg)   SpO2 96%   BMI 33.02 kg/m  Wt Readings from Last 3 Encounters:  08/11/20 180 lb 9.6 oz (81.9 kg)  07/07/20 176 lb 6.4 oz (80 kg)  06/08/20 171 lb (77.6 kg)     Lab Results  Component Value Date    WBC 7.9 06/23/2020   HGB 10.5 (L) 06/23/2020   HCT 32.8 (L) 06/23/2020   PLT 147 (L) 06/23/2020   GLUCOSE 83 08/11/2020   CHOL 130 08/11/2020   TRIG 80.0 08/11/2020   HDL 48.30 08/11/2020   LDLCALC 66 08/11/2020   ALT 10 08/11/2020   AST 17 08/11/2020   NA 139 08/11/2020   K 4.1 08/11/2020   CL 100 08/11/2020   CREATININE 0.99 08/11/2020   BUN 23 08/11/2020   CO2 31 08/11/2020   TSH 1.17 08/11/2020   INR 0.89 08/06/2018   HGBA1C 6.0 10/19/2014    MM 3D SCREEN BREAST BILATERAL  Result Date: 02/25/2020 CLINICAL DATA:  Screening. EXAM: DIGITAL SCREENING BILATERAL MAMMOGRAM WITH TOMO AND CAD COMPARISON:  Previous exam(s). ACR Breast Density Category b: There are scattered areas of fibroglandular density. FINDINGS: There are no findings suspicious for malignancy. Images were processed with CAD. IMPRESSION: No mammographic evidence of malignancy. A result letter of this screening mammogram will be mailed directly to the patient. RECOMMENDATION: Screening mammogram in one year. (Code:SM-B-01Y) BI-RADS CATEGORY  1: Negative. Electronically Signed   By: Kristopher Oppenheim M.D.   On: 02/25/2020 13:26       Assessment & Plan:   Problem List Items Addressed This Visit    Stress    Seeing psychiatry.  Stable on current medication regimen.  Continues on cymbalta, buspar and trazodone.        Pulmonary hypertension (HCC)    Known sleep apnea.  Followed by pulmonary.       Relevant Medications   aspirin 81 MG EC tablet   OSA (obstructive sleep apnea)    CPAP.       Neuropathy    Continues on gabapentin.       Leg swelling    Much improved.  Continue compression hose.       Hypothyroidism - Primary    On thyroid replacement.  Follow tsh.       Hypertension    On metoprolol 4m q day now.  Also on amlodipine.  Blood pressure on recheck as outlined.  Continue current medication.  Follow pressure.  Follow metabolic panel.       Relevant Medications   aspirin 81 MG EC tablet    Other Relevant Orders   TSH (Completed)   Basic metabolic panel (Completed)   Hyperlipidemia    On lipitor.  Low cholesterol diet and exercise.  Follow lipid panel and liver function tests.        Relevant Medications   aspirin 81 MG EC tablet   Other Relevant Orders   Hepatic function panel (Completed)   Lipid panel (Completed)   GERD (gastroesophageal reflux disease)    Controlled. On aciphex.        CHF (congestive heart failure) (HCC)    No evidence of volume overload.  Continues on lasix and benazepril.  Followed by cardiology.  Stable.        Relevant Medications   aspirin 81 MG EC tablet   Anemia    Followed by hematology.  Kahlyn Shippey, MD 

## 2020-08-12 ENCOUNTER — Encounter: Payer: Self-pay | Admitting: Internal Medicine

## 2020-08-12 NOTE — Assessment & Plan Note (Signed)
Continues on gabapentin.   

## 2020-08-12 NOTE — Assessment & Plan Note (Signed)
No evidence of volume overload.  Continues on lasix and benazepril.  Followed by cardiology.  Stable.

## 2020-08-12 NOTE — Assessment & Plan Note (Signed)
Followed by hematology.

## 2020-08-12 NOTE — Assessment & Plan Note (Signed)
Seeing psychiatry.  Stable on current medication regimen.  Continues on cymbalta, buspar and trazodone.

## 2020-08-12 NOTE — Assessment & Plan Note (Signed)
On thyroid replacement.  Follow tsh.

## 2020-08-12 NOTE — Assessment & Plan Note (Signed)
Known sleep apnea.  Followed by pulmonary.

## 2020-08-12 NOTE — Assessment & Plan Note (Signed)
CPAP.  

## 2020-08-12 NOTE — Assessment & Plan Note (Signed)
On metoprolol 49m q day now.  Also on amlodipine.  Blood pressure on recheck as outlined.  Continue current medication.  Follow pressure.  Follow metabolic panel.

## 2020-08-12 NOTE — Assessment & Plan Note (Signed)
On lipitor.  Low cholesterol diet and exercise.  Follow lipid panel and liver function tests.   

## 2020-08-12 NOTE — Assessment & Plan Note (Signed)
Much improved.  Continue compression hose.

## 2020-08-12 NOTE — Assessment & Plan Note (Signed)
Controlled. On aciphex.

## 2020-09-01 DIAGNOSIS — M19011 Primary osteoarthritis, right shoulder: Secondary | ICD-10-CM | POA: Diagnosis not present

## 2020-09-01 DIAGNOSIS — M19012 Primary osteoarthritis, left shoulder: Secondary | ICD-10-CM | POA: Diagnosis not present

## 2020-09-04 ENCOUNTER — Telehealth: Payer: Self-pay

## 2020-09-04 NOTE — Telephone Encounter (Signed)
Pt needs 90 day supply of lipitor sent to [facility].

## 2020-09-05 ENCOUNTER — Other Ambulatory Visit: Payer: Self-pay

## 2020-09-05 MED ORDER — ATORVASTATIN CALCIUM 20 MG PO TABS: 20.0000 mg | ORAL_TABLET | Freq: Every day | ORAL | 1 refills | Status: DC

## 2020-09-05 NOTE — Addendum Note (Signed)
Addended by: Nanci Pina on: 09/05/2020 04:23 PM   Modules accepted: Orders

## 2020-09-05 NOTE — Telephone Encounter (Signed)
Refill sent as requested. 

## 2020-09-11 DIAGNOSIS — Q675 Congenital deformity of spine: Secondary | ICD-10-CM | POA: Diagnosis not present

## 2020-09-11 DIAGNOSIS — M48061 Spinal stenosis, lumbar region without neurogenic claudication: Secondary | ICD-10-CM | POA: Diagnosis not present

## 2020-09-11 DIAGNOSIS — M5416 Radiculopathy, lumbar region: Secondary | ICD-10-CM | POA: Diagnosis not present

## 2020-09-11 DIAGNOSIS — S76312D Strain of muscle, fascia and tendon of the posterior muscle group at thigh level, left thigh, subsequent encounter: Secondary | ICD-10-CM | POA: Diagnosis not present

## 2020-09-11 DIAGNOSIS — M519 Unspecified thoracic, thoracolumbar and lumbosacral intervertebral disc disorder: Secondary | ICD-10-CM | POA: Diagnosis not present

## 2020-09-11 DIAGNOSIS — K5903 Drug induced constipation: Secondary | ICD-10-CM | POA: Diagnosis not present

## 2020-09-11 DIAGNOSIS — Z79899 Other long term (current) drug therapy: Secondary | ICD-10-CM | POA: Diagnosis not present

## 2020-09-12 ENCOUNTER — Telehealth: Payer: Self-pay | Admitting: Internal Medicine

## 2020-09-12 DIAGNOSIS — Z8616 Personal history of COVID-19: Secondary | ICD-10-CM

## 2020-09-12 NOTE — Telephone Encounter (Signed)
Pt wanted to schedule a lab appt to check her covid antibodies before she gets the booster

## 2020-09-13 NOTE — Telephone Encounter (Signed)
Are you ok with her coming in to have this done?

## 2020-09-13 NOTE — Addendum Note (Signed)
Addended by: Alisa Graff on: 09/13/2020 03:27 PM   Modules accepted: Orders

## 2020-09-13 NOTE — Telephone Encounter (Signed)
Order placed for covid ab test.  Ok to schedule if she desires to check.

## 2020-09-14 NOTE — Telephone Encounter (Signed)
Labs scheduled

## 2020-09-15 ENCOUNTER — Other Ambulatory Visit: Payer: Self-pay

## 2020-09-15 ENCOUNTER — Other Ambulatory Visit (INDEPENDENT_AMBULATORY_CARE_PROVIDER_SITE_OTHER): Payer: PPO

## 2020-09-15 DIAGNOSIS — Z8616 Personal history of COVID-19: Secondary | ICD-10-CM

## 2020-09-15 DIAGNOSIS — M25512 Pain in left shoulder: Secondary | ICD-10-CM | POA: Diagnosis not present

## 2020-09-15 DIAGNOSIS — M25511 Pain in right shoulder: Secondary | ICD-10-CM | POA: Diagnosis not present

## 2020-09-15 NOTE — Addendum Note (Signed)
Addended by: Leeanne Rio on: 09/15/2020 10:08 AM   Modules accepted: Orders

## 2020-09-20 ENCOUNTER — Telehealth: Payer: Self-pay | Admitting: Internal Medicine

## 2020-09-20 ENCOUNTER — Encounter: Payer: Self-pay | Admitting: Internal Medicine

## 2020-09-20 NOTE — Telephone Encounter (Signed)
Patient is having extreme pain in both feet, rt is the worse. Patient wants to know what to do .

## 2020-09-20 NOTE — Telephone Encounter (Signed)
Called and spoke to Kimberly Roy.  Pain resolved now. She is walking around.  Feels better.  Seems to be more c/w cramp.  Foot - drew. First time occurred.  Discussed stretches.  Discussed staying hydrated.  Hold on adding pain meds.  Follow.  Notify me if persistent.  Pt comfortable with this plan.

## 2020-09-20 NOTE — Telephone Encounter (Signed)
Patient stated she is having severe pain in both feet but mainly the right. She stated its like a cramp on top of the foot that wont go away,the px  runs up her leg episode has lasted an hour today. Patient hasn't taken any medication. Patient wants to know if she can take more gabapentin or take her norco. Patient is not having any swelling, redness, numbness, and SOB.Patient is wearing her support hose for her hx of edema.Marland Kitchen

## 2020-09-21 LAB — SARS-COV-2 SEMI-QUANTITATIVE TOTAL ANTIBODY, SPIKE: SARS COV2 AB, Total Spike Semi QN: >2500 U/mL — ABNORMAL HIGH (ref <0.8)

## 2020-09-21 NOTE — Telephone Encounter (Signed)
When I talked to her, she was not having any pain or cramping.  Per note, has not had any more cramping.  I was not going to change her medication, I had mentioned if cramps continued, we may have to start something like mag oxide.

## 2020-09-21 NOTE — Telephone Encounter (Signed)
FYI 

## 2020-09-21 NOTE — Telephone Encounter (Signed)
Patient is okay with this. She is going to let me know if symptoms persist so we can change medication if needed.

## 2020-09-21 NOTE — Telephone Encounter (Signed)
Do you want to add mag ox and see if this works for her?

## 2020-09-21 NOTE — Telephone Encounter (Signed)
Is this something you discussed with her?

## 2020-09-21 NOTE — Telephone Encounter (Signed)
Pt said she didn't have any more cramping last night. She said Dr. Nicki Reaper mentioned changing gabapentin and putting her on something else. She wants to know what medication Dr. Nicki Reaper was going to put her on? She has someone picking up her prescriptions today and she wants to make sure they pick this one up too.

## 2020-09-21 NOTE — Telephone Encounter (Signed)
Pt called to let you know that cramping in right leg has came back. FYI

## 2020-09-21 NOTE — Telephone Encounter (Signed)
I want her to stay hydrated.  Stretches.  Can call in mag oxide 474m q day.  Will need to let [place] if she develops any new symptoms or if cramping continues.

## 2020-09-21 NOTE — Telephone Encounter (Signed)
Patient has been notified

## 2020-09-22 ENCOUNTER — Other Ambulatory Visit: Payer: Self-pay

## 2020-09-22 MED ORDER — MAGNESIUM OXIDE 400 MG PO TABS: 400.0000 mg | ORAL_TABLET | Freq: Every day | ORAL | 1 refills | Status: DC

## 2020-09-22 NOTE — Telephone Encounter (Signed)
Pt called and said that last night she started to have restless leg and a jerking feeling when she laid down

## 2020-09-22 NOTE — Telephone Encounter (Signed)
Called patient to discuss. She has not tried the mag ox yet. She is going to monitor over the weekend and let me know. If does not work she is going to do visit with Dr Nicki Reaper to discuss.

## 2020-09-29 DIAGNOSIS — F411 Generalized anxiety disorder: Secondary | ICD-10-CM | POA: Diagnosis not present

## 2020-09-29 DIAGNOSIS — F331 Major depressive disorder, recurrent, moderate: Secondary | ICD-10-CM | POA: Diagnosis not present

## 2020-10-01 ENCOUNTER — Encounter: Payer: Self-pay | Admitting: Internal Medicine

## 2020-10-02 ENCOUNTER — Telehealth: Payer: Self-pay | Admitting: Internal Medicine

## 2020-10-02 NOTE — Telephone Encounter (Signed)
Patient called in wanted to know if her mychart message had been checked

## 2020-10-02 NOTE — Telephone Encounter (Signed)
See mychart.  

## 2020-10-02 NOTE — Telephone Encounter (Signed)
Pt scheduled with Dr Nicki Reaper

## 2020-10-04 ENCOUNTER — Telehealth (INDEPENDENT_AMBULATORY_CARE_PROVIDER_SITE_OTHER): Payer: PPO | Admitting: Internal Medicine

## 2020-10-04 DIAGNOSIS — I509 Heart failure, unspecified: Secondary | ICD-10-CM

## 2020-10-04 DIAGNOSIS — D649 Anemia, unspecified: Secondary | ICD-10-CM | POA: Diagnosis not present

## 2020-10-04 DIAGNOSIS — R252 Cramp and spasm: Secondary | ICD-10-CM | POA: Diagnosis not present

## 2020-10-04 DIAGNOSIS — I10 Essential (primary) hypertension: Secondary | ICD-10-CM

## 2020-10-04 NOTE — Progress Notes (Signed)
Patient ID: Kimberly Roy, female   DOB: 1931-08-13, 87 y.o.   MRN: [id number]   Virtual Visit via video Note  This visit type was conducted due to national recommendations for restrictions regarding the COVID-19 pandemic (e.g. social distancing).  This format is felt to be most appropriate for this patient at this time.  All issues noted in this document were discussed and addressed.  No physical exam was performed (except for noted visual exam findings with Video Visits).   I connected with [name] by a video enabled telemedicine application and verified that I am speaking with the correct person using two identifiers. Location patient: home Location provider: work  Persons participating in the virtual visit: patient, provider  The limitations, risks, security and privacy concerns of performing an evaluation and management service by video and the availability of in person appointments have been discussed.  It has also been discussed with the patient that there may be a patient responsible charge related to this service. The patient expressed understanding and agreed to proceed.   Reason for visit: work in appt  HPI: Work in appt with concerns regarding cramps and extremity shaking.  Has noticed arms and hands shaking.  She is feeling like she is going to drop things.  Hard for her to type.  Able to eat.  No chest pain or sob. More pain back and shoulders. Injection helped.  Leans over to walk.  Also reports cramping - mostly notices at night.  One episode of restless legs.  Has been doing some foot exercises.  Started magnesium.     ROS: See pertinent positives and negatives per HPI.  Past Medical History:  Diagnosis Date  . Anemia   . Anxiety   . Chest pain   . CHF (congestive heart failure) ([place])   . Constipation   . DDD (degenerative disc disease), cervical   . Depression   . DVT (deep venous thrombosis) ([place])   . Dysphonia   . Dyspnea   . Fatty liver   . Fatty liver   .  Headache   . Hyperlipidemia   . Hyperpiesia   . Hypertension   . Hypothyroidism   . Interstitial cystitis   . Left ventricular dysfunction   . Lymphedema   . Nephrolithiasis   . Obstructive sleep apnea   . Osteoarthritis    knees/cervical and lumbar spine  . Pulmonary hypertension ([place])   . Pulmonary nodules    followed by Dr Raul Del  . Pure hypercholesterolemia   . Renal cyst    right    Past Surgical History:  Procedure Laterality Date  . ABDOMINAL HYSTERECTOMY     ovaries left in place  . APPENDECTOMY    . Back Surgeries    . BACK SURGERY    . BREAST REDUCTION SURGERY     3/99  . CARDIAC CATHETERIZATION    . cataracts Bilateral   . CERVICAL SPINE SURGERY    . ESOPHAGEAL MANOMETRY N/A 08/02/2015   Procedure: ESOPHAGEAL MANOMETRY (EM);  Surgeon: Josefine Class, MD;  Location: [facility] ENDOSCOPY;  Service: Endoscopy;  Laterality: N/A;  . ESOPHAGOGASTRODUODENOSCOPY N/A 02/27/2015   Procedure: ESOPHAGOGASTRODUODENOSCOPY (EGD);  Surgeon: Hulen Luster, MD;  Location: [facility] ENDOSCOPY;  Service: Gastroenterology;  Laterality: N/A;  . ESOPHAGOGASTRODUODENOSCOPY (EGD) WITH PROPOFOL N/A 10/30/2018   Procedure: ESOPHAGOGASTRODUODENOSCOPY (EGD) WITH PROPOFOL;  Surgeon: Lollie Sails, MD;  Location: [facility] ENDOSCOPY;  Service: Endoscopy;  Laterality: N/A;  . EXCISIONAL HEMORRHOIDECTOMY    . EYE  SURGERY    . FRACTURE SURGERY    . HEMORRHOID SURGERY    . HIP SURGERY  2013   Right hip surgery  . JOINT REPLACEMENT    . KNEE ARTHROSCOPY     left and right  . ORIF FEMUR FRACTURE Right 08/07/2018   Procedure: OPEN REDUCTION INTERNAL FIXATION (ORIF) DISTAL FEMUR FRACTURE;  Surgeon: Hessie Knows, MD;  Location: [facility] ORS;  Service: Orthopedics;  Laterality: Right;  . REDUCTION MAMMAPLASTY Bilateral YRS AGO  . REPLACEMENT TOTAL KNEE Bilateral   . rotator cuff surgery     blilateral  . TONSILECTOMY/ADENOIDECTOMY WITH MYRINGOTOMY    . VISCERAL ARTERY INTERVENTION N/A 02/25/2019    Procedure: VISCERAL ARTERY INTERVENTION;  Surgeon: Algernon Huxley, MD;  Location: [place] CV LAB;  Service: Cardiovascular;  Laterality: N/A;    Family History  Problem Relation Age of Onset  . Heart disease Mother   . Stroke Mother   . Hypertension Mother   . Heart disease Father        myocardial infarction age 25  . Breast cancer Neg Hx     SOCIAL HX: reviewed.    Current Outpatient Medications:  .  albuterol (VENTOLIN HFA) 108 (90 Base) MCG/ACT inhaler, INHALE 2 PUFFS INTO THE LUNGS EVERY 6 (SIX) HOURS AS NEEDED FOR WHEEZING OR SHORTNESS OF BREATH, Disp: 18 g, Rfl: 0 .  aluminum-magnesium hydroxide-simethicone (MAALOX) 353-299-24 MG/5ML SUSP, Take 15 mLs by mouth 4 (four) times daily -  before meals and at bedtime. , Disp: , Rfl:  .  amLODipine (NORVASC) 10 MG tablet, TAKE 1 TABLET BY MOUTH DAILY, Disp: 90 tablet, Rfl: 1 .  aspirin 81 MG EC tablet, Take by mouth., Disp: , Rfl:  .  atorvastatin (LIPITOR) 20 MG tablet, Take 1 tablet (20 mg total) by mouth at bedtime., Disp: 90 tablet, Rfl: 1 .  azelastine (ASTELIN) 0.1 % nasal spray, PLACE 1 SPRAY INTO BOTH NOSTRILS TWO TIMES DAILY, Disp: 30 mL, Rfl: 1 .  benazepril (LOTENSIN) 40 MG tablet, TAKE 1 TABLET BY MOUTH DAILY, Disp: 90 tablet, Rfl: 1 .  budesonide-formoterol (SYMBICORT) 80-4.5 MCG/ACT inhaler, Inhale 2 puffs into the lungs 2 (two) times daily., Disp: 1 each, Rfl: 12 .  busPIRone (BUSPAR) 10 MG tablet, Take 10 mg by mouth 2 (two) times daily. , Disp: , Rfl:  .  calcium citrate-vitamin D (CITRACAL+D) 315-200 MG-UNIT tablet, Take 1 tablet by mouth daily., Disp: , Rfl:  .  cephALEXin (KEFLEX) 250 MG capsule, Take 250 mg by mouth daily., Disp: , Rfl:  .  Cholecalciferol (VITAMIN D3) 1000 UNITS CAPS, Take by mouth., Disp: , Rfl:  .  DULoxetine (CYMBALTA) 60 MG capsule, TAKE 1 CAPSULE BY MOUTH EVERY DAY., Disp: 30 capsule, Rfl: 3 .  fluocinonide (LIDEX) 0.05 % external solution, Apply topically., Disp: , Rfl:  .  fluticasone  (FLONASE) 50 MCG/ACT nasal spray, Place 2 sprays into both nostrils daily., Disp: 16 g, Rfl: 1 .  furosemide (LASIX) 20 MG tablet, TAKE 1 TABLET BY MOUTH EVERY DAY AS NEEDED, Disp: 90 tablet, Rfl: 0 .  gabapentin (NEURONTIN) 300 MG capsule, TAKE 2 CAPSULES BY MOUTH 3 TIMES A DAY., Disp: 540 capsule, Rfl: 2 .  hydrochlorothiazide (MICROZIDE) 12.5 MG capsule, Take 12.5 mg by mouth daily., Disp: , Rfl:  .  HYDROcodone-acetaminophen (NORCO) 10-325 MG tablet, Take 1 tablet by mouth 3 (three) times daily., Disp: 10 tablet, Rfl: 0 .  ketoconazole (NIZORAL) 2 % shampoo, Apply 1 application topically 2 (two) times  a week., Disp: , Rfl:  .  magnesium oxide (MAG-OX) 400 MG tablet, Take 1 tablet (400 mg total) by mouth daily., Disp: 30 tablet, Rfl: 1 .  metoprolol succinate (TOPROL-XL) 100 MG 24 hr tablet, TAKE 1 TABLET BY MOUTH TWICE A DAY WITH OR IMMEDIATELY FOLLOWING A MEAL. (Patient taking differently: Take 50 mg by mouth in the morning and at bedtime.), Disp: 180 tablet, Rfl: 1 .  morphine (MS CONTIN) 60 MG 12 hr tablet, Take 1 tablet (60 mg total) by mouth 2 (two) times daily., Disp: 10 tablet, Rfl: 0 .  Naldemedine Tosylate 0.2 MG TABS, Take 0.2 mg by mouth daily., Disp: , Rfl:  .  polyethylene glycol powder (GLYCOLAX/MIRALAX) 17 GM/SCOOP powder, MIX 17 GRAMS AS MARKED ON BOTTLE TOP IN 8 OUNCES OF WATER AND DRINK ONCE A DAY AS DIRECTED., Disp: 527 g, Rfl: 0 .  RABEprazole (ACIPHEX) 20 MG tablet, TAKE 1 TABLET BY MOUTH 2 TIMES DAILY BEFORE A MEAL, Disp: 180 tablet, Rfl: 1 .  senna (SENOKOT) 8.6 MG TABS tablet, Take 1 tablet by mouth 2 (two) times daily., Disp: , Rfl:  .  Simethicone (GAS-X PO), Take 2 tablets by mouth 2 (two) times daily., Disp: , Rfl:  .  sodium chloride (OCEAN) 0.65 % nasal spray, Place 1 spray into the nose as needed., Disp: , Rfl:  .  SYNTHROID 100 MCG tablet, TAKE 1 TABLET BY MOUTH DAILY, Disp: 90 tablet, Rfl: 3 .  traZODone (DESYREL) 50 MG tablet, Take 25-50 mg by mouth at bedtime as  needed., Disp: , Rfl:   EXAM:  GENERAL: alert, oriented, appears well and in no acute distress  HEENT: atraumatic, conjunttiva clear, no obvious abnormalities on inspection of external nose and ears  NECK: normal movements of the head and neck  LUNGS: on inspection no signs of respiratory distress, breathing rate appears normal, no obvious gross SOB, gasping or wheezing  CV: no obvious cyanosis  PSYCH/NEURO: pleasant and cooperative, no obvious depression or anxiety, speech and thought processing grossly intact  ASSESSMENT AND PLAN:  Discussed the following assessment and plan:  Problem List Items Addressed This Visit   None      I discussed the assessment and treatment plan with the patient. The patient was provided an opportunity to ask questions and all were answered. The patient agreed with the plan and demonstrated an understanding of the instructions.   The patient was advised to call back or seek an in-person evaluation if the symptoms worsen or if the condition fails to improve as anticipated.    Einar Pheasant, MD

## 2020-10-06 ENCOUNTER — Telehealth: Payer: Self-pay

## 2020-10-06 NOTE — Telephone Encounter (Signed)
Pt states that [facility] is not in her network so she needs to go to neurologist in [facility]. FYI

## 2020-10-08 ENCOUNTER — Telehealth: Payer: Self-pay | Admitting: Internal Medicine

## 2020-10-08 ENCOUNTER — Encounter: Payer: Self-pay | Admitting: Internal Medicine

## 2020-10-08 NOTE — Assessment & Plan Note (Signed)
Describes the muscle cramps.  Taking magnesium.  Discussed stretches.  Has noticed the cramps, unsteadiness and tremors as outlined.  Discussed further evaluation.  Refer to neurology for further evaluation.

## 2020-10-08 NOTE — Assessment & Plan Note (Signed)
Followed by hematology.  Follow cbc and iron studies.

## 2020-10-08 NOTE — Assessment & Plan Note (Signed)
No evidence of volume overload.  Continue lasix and benazepril.  Stable.

## 2020-10-08 NOTE — Assessment & Plan Note (Signed)
Continue amlodipine and metoprolol.  Follow pressures.

## 2020-10-08 NOTE — Telephone Encounter (Signed)
Please schedule fasting lab appt in 2-3 weeks.  Thanks.

## 2020-10-09 NOTE — Telephone Encounter (Signed)
Called and scheduled pt

## 2020-10-10 DIAGNOSIS — F330 Major depressive disorder, recurrent, mild: Secondary | ICD-10-CM | POA: Diagnosis not present

## 2020-10-10 DIAGNOSIS — E261 Secondary hyperaldosteronism: Secondary | ICD-10-CM | POA: Diagnosis not present

## 2020-10-10 DIAGNOSIS — I509 Heart failure, unspecified: Secondary | ICD-10-CM | POA: Diagnosis not present

## 2020-10-10 DIAGNOSIS — Z96651 Presence of right artificial knee joint: Secondary | ICD-10-CM | POA: Diagnosis not present

## 2020-10-10 DIAGNOSIS — M461 Sacroiliitis, not elsewhere classified: Secondary | ICD-10-CM | POA: Diagnosis not present

## 2020-10-10 DIAGNOSIS — J449 Chronic obstructive pulmonary disease, unspecified: Secondary | ICD-10-CM | POA: Diagnosis not present

## 2020-10-10 DIAGNOSIS — T8484XD Pain due to internal orthopedic prosthetic devices, implants and grafts, subsequent encounter: Secondary | ICD-10-CM | POA: Diagnosis not present

## 2020-10-10 DIAGNOSIS — F5105 Insomnia due to other mental disorder: Secondary | ICD-10-CM | POA: Diagnosis not present

## 2020-10-10 DIAGNOSIS — F419 Anxiety disorder, unspecified: Secondary | ICD-10-CM | POA: Diagnosis not present

## 2020-10-10 DIAGNOSIS — E1151 Type 2 diabetes mellitus with diabetic peripheral angiopathy without gangrene: Secondary | ICD-10-CM | POA: Diagnosis not present

## 2020-10-10 DIAGNOSIS — Z515 Encounter for palliative care: Secondary | ICD-10-CM | POA: Diagnosis not present

## 2020-10-10 DIAGNOSIS — D692 Other nonthrombocytopenic purpura: Secondary | ICD-10-CM | POA: Diagnosis not present

## 2020-10-13 ENCOUNTER — Telehealth: Payer: Self-pay | Admitting: Internal Medicine

## 2020-10-13 NOTE — Telephone Encounter (Signed)
Pt called and would like to have an abdominal US done  She is having stomach pain and she is unable to sleep at night because of the pain

## 2020-10-13 NOTE — Telephone Encounter (Signed)
Pt notified.

## 2020-10-13 NOTE — Telephone Encounter (Signed)
Agree with recommendation if increased abdominal pain, needs to be seen.  (if severe, would recommend being seen now for evaluation)

## 2020-10-13 NOTE — Telephone Encounter (Signed)
Patient say she is having abdominal discomfort all over abdomen more to upper left quadrant. Say she has had this for along time. Now the discomfort is worse than the past. Patient rated pain at a 9 on the pain. Patient says her bowels are loose but she takes a lot of medication to make it that way. Advised patient if her pain rated at a 9 PCP would want her to go to UC or ER to be evaluated, patient says "it might not be that bad". Patient passing Flatulence, no nausea or vomiting , but does have decreased appetite. Scheduled Patient for 10/17/20 at 12. Advised symptoms worsen , increased pain, nausea , vomiting or more than 6 to 8 loose stools needs to be seen.

## 2020-10-17 ENCOUNTER — Ambulatory Visit (INDEPENDENT_AMBULATORY_CARE_PROVIDER_SITE_OTHER): Payer: PPO | Admitting: Internal Medicine

## 2020-10-17 ENCOUNTER — Other Ambulatory Visit: Payer: Self-pay

## 2020-10-17 ENCOUNTER — Encounter: Payer: Self-pay | Admitting: Internal Medicine

## 2020-10-17 VITALS — BP 136/62 | HR 71 | Temp 98.1°F | Ht 62.01 in | Wt 184.6 lb

## 2020-10-17 DIAGNOSIS — E039 Hypothyroidism, unspecified: Secondary | ICD-10-CM

## 2020-10-17 DIAGNOSIS — K219 Gastro-esophageal reflux disease without esophagitis: Secondary | ICD-10-CM | POA: Diagnosis not present

## 2020-10-17 DIAGNOSIS — G4733 Obstructive sleep apnea (adult) (pediatric): Secondary | ICD-10-CM

## 2020-10-17 DIAGNOSIS — I509 Heart failure, unspecified: Secondary | ICD-10-CM | POA: Diagnosis not present

## 2020-10-17 DIAGNOSIS — D649 Anemia, unspecified: Secondary | ICD-10-CM

## 2020-10-17 DIAGNOSIS — I272 Pulmonary hypertension, unspecified: Secondary | ICD-10-CM

## 2020-10-17 DIAGNOSIS — M7989 Other specified soft tissue disorders: Secondary | ICD-10-CM

## 2020-10-17 DIAGNOSIS — G629 Polyneuropathy, unspecified: Secondary | ICD-10-CM | POA: Diagnosis not present

## 2020-10-17 DIAGNOSIS — F439 Reaction to severe stress, unspecified: Secondary | ICD-10-CM | POA: Diagnosis not present

## 2020-10-17 DIAGNOSIS — I10 Essential (primary) hypertension: Secondary | ICD-10-CM

## 2020-10-17 DIAGNOSIS — E785 Hyperlipidemia, unspecified: Secondary | ICD-10-CM

## 2020-10-17 DIAGNOSIS — R1084 Generalized abdominal pain: Secondary | ICD-10-CM

## 2020-10-17 DIAGNOSIS — J9611 Chronic respiratory failure with hypoxia: Secondary | ICD-10-CM

## 2020-10-17 LAB — AMYLASE: Amylase: 37 U/L (ref 27–131)

## 2020-10-17 LAB — CBC WITH DIFFERENTIAL/PLATELET
Basophils Absolute: 0 10*3/uL (ref 0.0–0.1)
Basophils Relative: 0.4 % (ref 0.0–3.0)
Eosinophils Absolute: 0.2 10*3/uL (ref 0.0–0.7)
Eosinophils Relative: 3 % (ref 0.0–5.0)
HCT: 34.8 % — ABNORMAL LOW (ref 36.0–46.0)
Hemoglobin: 11.4 g/dL — ABNORMAL LOW (ref 12.0–15.0)
Lymphocytes Relative: 21.5 % (ref 12.0–46.0)
Lymphs Abs: 1.4 10*3/uL (ref 0.7–4.0)
MCHC: 32.8 g/dL (ref 30.0–36.0)
MCV: 97.8 fl (ref 78.0–100.0)
Monocytes Absolute: 0.5 10*3/uL (ref 0.1–1.0)
Monocytes Relative: 7.2 % (ref 3.0–12.0)
Neutro Abs: 4.4 10*3/uL (ref 1.4–7.7)
Neutrophils Relative %: 67.9 % (ref 43.0–77.0)
Platelets: 182 10*3/uL (ref 150.0–400.0)
RBC: 3.56 Mil/uL — ABNORMAL LOW (ref 3.87–5.11)
RDW: 14.3 % (ref 11.5–15.5)
WBC: 6.5 10*3/uL (ref 4.0–10.5)

## 2020-10-17 LAB — HEPATIC FUNCTION PANEL
ALT: 11 U/L (ref 0–35)
AST: 17 U/L (ref 0–37)
Albumin: 3.9 g/dL (ref 3.5–5.2)
Alkaline Phosphatase: 91 U/L (ref 39–117)
Bilirubin, Direct: 0.1 mg/dL (ref 0.0–0.3)
Total Bilirubin: 0.5 mg/dL (ref 0.2–1.2)
Total Protein: 6.5 g/dL (ref 6.0–8.3)

## 2020-10-17 LAB — BASIC METABOLIC PANEL
BUN: 19 mg/dL (ref 6–23)
CO2: 34 mEq/L — ABNORMAL HIGH (ref 19–32)
Calcium: 10 mg/dL (ref 8.4–10.5)
Chloride: 101 mEq/L (ref 96–112)
Creatinine, Ser: 0.92 mg/dL (ref 0.40–1.20)
GFR: 55.94 mL/min — ABNORMAL LOW (ref >60.00)
Glucose, Bld: 95 mg/dL (ref 70–99)
Potassium: 4.7 mEq/L (ref 3.5–5.1)
Sodium: 140 mEq/L (ref 135–145)

## 2020-10-17 LAB — LIPASE: Lipase: 14 U/L (ref 11.0–59.0)

## 2020-10-17 NOTE — Progress Notes (Signed)
Patient ID: Kimberly Roy, female   DOB: 1930-10-08, 87 y.o.   MRN: [id number]   Subjective:    Patient ID: Kimberly Roy, female    DOB: 05-Dec-1930, 87 y.o.   MRN: [id number]  HPI This visit occurred during the SARS-CoV-2 public health emergency.  Safety protocols were in place, including screening questions prior to the visit, additional usage of staff PPE, and extensive cleaning of exam room while observing appropriate contact time as indicated for disinfecting solutions.  Patient here for work in appt.  She is accompanied by family member.  History obtained from both of them.  Worked in to discuss her abdominal discomfort.  Had several concerns.  Having some issues wit her right elbow. Seeing ortho next week.  Some swelling pedal and ankle swelling.  Wears compression hose.  Discussed leg elevation.  No increased sob.  Taking lasix.  Discussed the need for decreased salt intake/sodium intake.  Reports some increased fatigue.  Taking sleeping pill - 1/2 tablet.  Discussed holding if she feels is making her too groggy the next am.  Will try melatonin to help with her sleep issues. She is not using her oxygen.  Discussed restarting.   Increased stress.  Brother-n-law - recent heart attack.  Has good support.  Also reports decreased appetite. Some abdominal discomfort.  Increased gas.  Taking gas-x.  Bowels ok.  No vomiting.    Past Medical History:  Diagnosis Date  . Anemia   . Anxiety   . Chest pain   . CHF (congestive heart failure) ([place])   . Constipation   . DDD (degenerative disc disease), cervical   . Depression   . DVT (deep venous thrombosis) ([place])   . Dysphonia   . Dyspnea   . Fatty liver   . Fatty liver   . Headache   . Hyperlipidemia   . Hyperpiesia   . Hypertension   . Hypothyroidism   . Interstitial cystitis   . Left ventricular dysfunction   . Lymphedema   . Nephrolithiasis   . Obstructive sleep apnea   . Osteoarthritis    knees/cervical and lumbar spine  . Pulmonary  hypertension ([place])   . Pulmonary nodules    followed by Dr Raul Del  . Pure hypercholesterolemia   . Renal cyst    right   Past Surgical History:  Procedure Laterality Date  . ABDOMINAL HYSTERECTOMY     ovaries left in place  . APPENDECTOMY    . Back Surgeries    . BACK SURGERY    . BREAST REDUCTION SURGERY     3/99  . CARDIAC CATHETERIZATION    . cataracts Bilateral   . CERVICAL SPINE SURGERY    . ESOPHAGEAL MANOMETRY N/A 08/02/2015   Procedure: ESOPHAGEAL MANOMETRY (EM);  Surgeon: Josefine Class, MD;  Location[facility] ENDOSCOPY;  Service: Endoscopy;  Laterality: N/A;  . ESOPHAGOGASTRODUODENOSCOPY N/A 02/27/2015   Procedure: ESOPHAGOGASTRODUODENOSCOPY (EGD);  Surgeon: Hulen Luster, MD;  Location: [facility] ENDOSCOPY;  Service: Gastroenterology;  Laterality: N/A;  . ESOPHAGOGASTRODUODENOSCOPY (EGD) WITH PROPOFOL N/A 10/30/2018   Procedure: ESOPHAGOGASTRODUODENOSCOPY (EGD) WITH PROPOFOL;  Surgeon: Lollie Sails, MD;  Location: [facility] ENDOSCOPY;  Service: Endoscopy;  Laterality: N/A;  . EXCISIONAL HEMORRHOIDECTOMY    . EYE SURGERY    . FRACTURE SURGERY    . HEMORRHOID SURGERY    . HIP SURGERY  2013   Right hip surgery  . JOINT REPLACEMENT    . KNEE ARTHROSCOPY     left and right  .  ORIF FEMUR FRACTURE Right 08/07/2018   Procedure: OPEN REDUCTION INTERNAL FIXATION (ORIF) DISTAL FEMUR FRACTURE;  Surgeon: Hessie Knows, MD;  Location: [facility] ORS;  Service: Orthopedics;  Laterality: Right;  . REDUCTION MAMMAPLASTY Bilateral YRS AGO  . REPLACEMENT TOTAL KNEE Bilateral   . rotator cuff surgery     blilateral  . TONSILECTOMY/ADENOIDECTOMY WITH MYRINGOTOMY    . VISCERAL ARTERY INTERVENTION N/A 02/25/2019   Procedure: VISCERAL ARTERY INTERVENTION;  Surgeon: Algernon Huxley, MD;  Location: [place] CV LAB;  Service: Cardiovascular;  Laterality: N/A;   Family History  Problem Relation Age of Onset  . Heart disease Mother   . Stroke Mother   . Hypertension Mother   . Heart disease Father         myocardial infarction age 12  . Breast cancer Neg Hx    Social History   Socioeconomic History  . Marital status: Widowed    Spouse name: Not on file  . Number of children: 2  . Years of education: Not on file  . Highest education level: Not on file  Occupational History    Comment: insurance agency  Tobacco Use  . Smoking status: Never Smoker  . Smokeless tobacco: Never Used  Vaping Use  . Vaping Use: Never used  Substance and Sexual Activity  . Alcohol use: No    Alcohol/week: 0.0 standard drinks  . Drug use: Never  . Sexual activity: Not Currently  Other Topics Concern  . Not on file  Social History Narrative  . Not on file   Social Determinants of Health   Financial Resource Strain: Not on file  Food Insecurity: Not on file  Transportation Needs: Not on file  Physical Activity: Not on file  Stress: Not on file  Social Connections: Not on file    Outpatient Encounter Medications as of 10/17/2020  Medication Sig  . albuterol (VENTOLIN HFA) 108 (90 Base) MCG/ACT inhaler INHALE 2 PUFFS INTO THE LUNGS EVERY 6 (SIX) HOURS AS NEEDED FOR WHEEZING OR SHORTNESS OF BREATH  . aluminum-magnesium hydroxide-simethicone (MAALOX) 200-200-20 MG/5ML SUSP Take 15 mLs by mouth 4 (four) times daily -  before meals and at bedtime.   Marland Kitchen amLODipine (NORVASC) 10 MG tablet TAKE 1 TABLET BY MOUTH DAILY  . aspirin 81 MG EC tablet Take by mouth.  Marland Kitchen atorvastatin (LIPITOR) 20 MG tablet Take 1 tablet (20 mg total) by mouth at bedtime.  Marland Kitchen azelastine (ASTELIN) 0.1 % nasal spray PLACE 1 SPRAY INTO BOTH NOSTRILS TWO TIMES DAILY  . benazepril (LOTENSIN) 40 MG tablet TAKE 1 TABLET BY MOUTH DAILY  . budesonide-formoterol (SYMBICORT) 80-4.5 MCG/ACT inhaler Inhale 2 puffs into the lungs 2 (two) times daily.  . busPIRone (BUSPAR) 10 MG tablet Take 10 mg by mouth 2 (two) times daily.   . calcium citrate-vitamin D (CITRACAL+D) 315-200 MG-UNIT tablet Take 1 tablet by mouth daily.  . cephALEXin (KEFLEX)  250 MG capsule Take 250 mg by mouth daily.  . Cholecalciferol (VITAMIN D3) 1000 UNITS CAPS Take by mouth.  . DULoxetine (CYMBALTA) 60 MG capsule TAKE 1 CAPSULE BY MOUTH EVERY DAY.  . fluocinonide (LIDEX) 0.05 % external solution Apply topically.  . fluticasone (FLONASE) 50 MCG/ACT nasal spray Place 2 sprays into both nostrils daily.  . furosemide (LASIX) 20 MG tablet TAKE 1 TABLET BY MOUTH EVERY DAY AS NEEDED  . gabapentin (NEURONTIN) 300 MG capsule TAKE 2 CAPSULES BY MOUTH 3 TIMES A DAY.  . hydrochlorothiazide (MICROZIDE) 12.5 MG capsule Take 12.5 mg by mouth  daily.  Marland Kitchen HYDROcodone-acetaminophen (NORCO) 10-325 MG tablet Take 1 tablet by mouth 3 (three) times daily.  Marland Kitchen ketoconazole (NIZORAL) 2 % shampoo Apply 1 application topically 2 (two) times a week.  . magnesium oxide (MAG-OX) 400 MG tablet Take 1 tablet (400 mg total) by mouth daily.  . metoprolol succinate (TOPROL-XL) 100 MG 24 hr tablet TAKE 1 TABLET BY MOUTH TWICE A DAY WITH OR IMMEDIATELY FOLLOWING A MEAL. (Patient taking differently: Take 50 mg by mouth in the morning and at bedtime.)  . morphine (MS CONTIN) 60 MG 12 hr tablet Take 1 tablet (60 mg total) by mouth 2 (two) times daily.  . Naldemedine Tosylate 0.2 MG TABS Take 0.2 mg by mouth daily.  . polyethylene glycol powder (GLYCOLAX/MIRALAX) 17 GM/SCOOP powder MIX 17 GRAMS AS MARKED ON BOTTLE TOP IN 8 OUNCES OF WATER AND DRINK ONCE A DAY AS DIRECTED.  . RABEprazole (ACIPHEX) 20 MG tablet TAKE 1 TABLET BY MOUTH 2 TIMES DAILY BEFORE A MEAL  . senna (SENOKOT) 8.6 MG TABS tablet Take 1 tablet by mouth 2 (two) times daily.  . Simethicone (GAS-X PO) Take 2 tablets by mouth 2 (two) times daily.  . sodium chloride (OCEAN) 0.65 % nasal spray Place 1 spray into the nose as needed.  Marland Kitchen SYNTHROID 100 MCG tablet TAKE 1 TABLET BY MOUTH DAILY  . traZODone (DESYREL) 50 MG tablet Take 25-50 mg by mouth at bedtime as needed.   No facility-administered encounter medications on file as of 10/17/2020.     Review of Systems  Constitutional: Positive for appetite change and fatigue. Negative for unexpected weight change.  HENT: Negative for congestion and sinus pressure.   Respiratory: Negative for cough and chest tightness.        Breathing stable.   Cardiovascular: Positive for leg swelling. Negative for chest pain and palpitations.  Gastrointestinal: Positive for abdominal pain. Negative for diarrhea and vomiting.  Genitourinary: Negative for difficulty urinating and dysuria.  Musculoskeletal: Negative for joint swelling.       Elbow pain as outlined.    Skin: Negative for color change and rash.  Neurological: Negative for dizziness, light-headedness and headaches.  Psychiatric/Behavioral: Negative for agitation and dysphoric mood.       Increased stress.         Objective:    Physical Exam Vitals reviewed.  Constitutional:      General: She is not in acute distress.    Appearance: She is well-developed.  HENT:     Head: Normocephalic and atraumatic.     Nose: Nose normal.     Mouth/Throat:     Mouth: Oropharynx is clear and moist.  Neck:     Thyroid: No thyromegaly.  Cardiovascular:     Rate and Rhythm: Normal rate and regular rhythm.  Pulmonary:     Effort: No respiratory distress.     Breath sounds: Normal breath sounds. No wheezing.  Abdominal:     General: Bowel sounds are normal.     Palpations: Abdomen is soft.     Tenderness: There is no abdominal tenderness.     Comments: Minimal tenderness to palpation.   Musculoskeletal:        General: No tenderness or edema.     Cervical back: Neck supple.  Lymphadenopathy:     Cervical: No cervical adenopathy.  Skin:    Comments: Stasis changes - lower extremities.   Pedal and ankle edema.    Neurological:     Mental Status: She is alert.  Psychiatric:        Mood and Affect: Mood normal.        Behavior: Behavior normal.     BP 136/62   Pulse 71   Temp 98.1 F (36.7 C)   Ht 5' 2.01" (1.575 m)   Wt 184  lb 9.6 oz (83.7 kg)   SpO2 97%   BMI 33.76 kg/m  Wt Readings from Last 3 Encounters:  10/17/20 184 lb 9.6 oz (83.7 kg)  10/04/20 180 lb (81.6 kg)  08/11/20 180 lb 9.6 oz (81.9 kg)     Lab Results  Component Value Date   WBC 6.5 10/17/2020   HGB 11.4 (L) 10/17/2020   HCT 34.8 (L) 10/17/2020   PLT 182.0 10/17/2020   GLUCOSE 95 10/17/2020   CHOL 130 08/11/2020   TRIG 80.0 08/11/2020   HDL 48.30 08/11/2020   LDLCALC 66 08/11/2020   ALT 11 10/17/2020   AST 17 10/17/2020   NA 140 10/17/2020   K 4.7 10/17/2020   CL 101 10/17/2020   CREATININE 0.92 10/17/2020   BUN 19 10/17/2020   CO2 34 (H) 10/17/2020   TSH 1.17 08/11/2020   INR 0.89 08/06/2018   HGBA1C 6.0 10/19/2014    MM 3D SCREEN BREAST BILATERAL  Result Date: 02/25/2020 CLINICAL DATA:  Screening. EXAM: DIGITAL SCREENING BILATERAL MAMMOGRAM WITH TOMO AND CAD COMPARISON:  Previous exam(s). ACR Breast Density Category b: There are scattered areas of fibroglandular density. FINDINGS: There are no findings suspicious for malignancy. Images were processed with CAD. IMPRESSION: No mammographic evidence of malignancy. A result letter of this screening mammogram will be mailed directly to the patient. RECOMMENDATION: Screening mammogram in one year. (Code:SM-B-01Y) BI-RADS CATEGORY  1: Negative. Electronically Signed   By: Kristopher Oppenheim M.D.   On: 02/25/2020 13:26       Assessment & Plan:   Problem List Items Addressed This Visit    Abdominal pain - Primary    Has had previous episodes of abdominal pain.  Extensive evaluation previously.  Has seen GI.  Saw [facility].  No significant stenosis.  Recurring pain now.  Decreased appetite.  Check amylase, lipase and liver function tests.  Discussed further evaluation including ultrasound, CT.  Follow.       Relevant Orders   Hepatic function panel (Completed)   Basic metabolic panel (Completed)   Amylase (Completed)   Lipase (Completed)   Anemia    Has been followed by hematology.   Check cbc.       Relevant Orders   CBC with Differential/Platelet (Completed)   CHF (congestive heart failure) ([facility])    Lower extremity swelling.  Weight up a few pounds.  On lasix.  Check metabolic panel.  Discussed increasing lasix for a couple of days.  Follow.        Chronic respiratory failure with hypoxia ([facility])    Has oxygen to wear at night.  Has not been wearing.  Restart.        GERD (gastroesophageal reflux disease)    Has been controlled on aciphex.       Hyperlipidemia    On lipitor.  Low cholesterol diet and exercise. Follow lipid panel and liver function tests.       Hypertension    Blood pressure on recheck improved.  Continue lasix, amlodipine and metoprolol.  Follow pressures.  Check metabolic panel.       Hypothyroidism    On thyroid replacement.  Follow tsh.       Leg swelling  Continue leg elevation.  Compression hose.  Decreased sodium intake.  Check metabolic panel.  On lasix.  Discussed increase for a couple of days.  Follow        Neuropathy    Remains on gabapentin.  Follow.       OSA (obstructive sleep apnea)    Has seen pulmonary previously. On oxygen at night.  Needs to restart.  Discussed f/u with pulmonary or reassessment by [facility].        Pulmonary hypertension ([place])    Has been followed by pulmonary.  Not using oxygen at night.  Instructed to restart.        Stress    Will stop trazodone.  Seeing psychiatry.  Feels is making her too groggy in the am.  Continues on cymbalta and buspar.  Follow.           I spent over 40 minutes with the patient and more than 50% of the time was spent in consultation regarding the above.  Time spent discussing current symptoms and concerns.  Time also spent discussing further evaluation and treatment.   Einar Pheasant, MD

## 2020-10-18 ENCOUNTER — Ambulatory Visit: Payer: PPO | Admitting: Internal Medicine

## 2020-10-18 DIAGNOSIS — I340 Nonrheumatic mitral (valve) insufficiency: Secondary | ICD-10-CM | POA: Diagnosis not present

## 2020-10-18 DIAGNOSIS — R079 Chest pain, unspecified: Secondary | ICD-10-CM | POA: Diagnosis not present

## 2020-10-18 DIAGNOSIS — R001 Bradycardia, unspecified: Secondary | ICD-10-CM | POA: Diagnosis not present

## 2020-10-18 DIAGNOSIS — E7800 Pure hypercholesterolemia, unspecified: Secondary | ICD-10-CM | POA: Diagnosis not present

## 2020-10-18 DIAGNOSIS — Z9889 Other specified postprocedural states: Secondary | ICD-10-CM | POA: Diagnosis not present

## 2020-10-18 DIAGNOSIS — I272 Pulmonary hypertension, unspecified: Secondary | ICD-10-CM | POA: Diagnosis not present

## 2020-10-18 DIAGNOSIS — I350 Nonrheumatic aortic (valve) stenosis: Secondary | ICD-10-CM | POA: Diagnosis not present

## 2020-10-18 DIAGNOSIS — I5032 Chronic diastolic (congestive) heart failure: Secondary | ICD-10-CM | POA: Diagnosis not present

## 2020-10-22 ENCOUNTER — Encounter: Payer: Self-pay | Admitting: Internal Medicine

## 2020-10-22 NOTE — Assessment & Plan Note (Signed)
Has been controlled on aciphex.

## 2020-10-22 NOTE — Assessment & Plan Note (Signed)
Will stop trazodone.  Seeing psychiatry.  Feels is making her too groggy in the am.  Continues on cymbalta and buspar.  Follow.

## 2020-10-22 NOTE — Assessment & Plan Note (Signed)
Has had previous episodes of abdominal pain.  Extensive evaluation previously.  Has seen GI.  Saw [facility].  No significant stenosis.  Recurring pain now.  Decreased appetite.  Check amylase, lipase and liver function tests.  Discussed further evaluation including ultrasound, CT.  Follow.

## 2020-10-22 NOTE — Assessment & Plan Note (Signed)
Lower extremity swelling.  Weight up a few pounds.  On lasix.  Check metabolic panel.  Discussed increasing lasix for a couple of days.  Follow.

## 2020-10-22 NOTE — Assessment & Plan Note (Signed)
Has seen pulmonary previously. On oxygen at night.  Needs to restart.  Discussed f/u with pulmonary or reassessment by [facility].

## 2020-10-22 NOTE — Assessment & Plan Note (Signed)
On lipitor.  Low cholesterol diet and exercise.  Follow lipid panel and liver function tests.   

## 2020-10-22 NOTE — Assessment & Plan Note (Signed)
Blood pressure on recheck improved.  Continue lasix, amlodipine and metoprolol.  Follow pressures.  Check metabolic panel.

## 2020-10-22 NOTE — Assessment & Plan Note (Signed)
Remains on gabapentin.  Follow.

## 2020-10-22 NOTE — Assessment & Plan Note (Signed)
Has oxygen to wear at night.  Has not been wearing.  Restart.

## 2020-10-22 NOTE — Assessment & Plan Note (Signed)
Has been followed by pulmonary.  Not using oxygen at night.  Instructed to restart.

## 2020-10-22 NOTE — Assessment & Plan Note (Signed)
Has been followed by hematology.  Check cbc.

## 2020-10-22 NOTE — Assessment & Plan Note (Signed)
On thyroid replacement.  Follow tsh.  

## 2020-10-22 NOTE — Assessment & Plan Note (Signed)
Continue leg elevation.  Compression hose.  Decreased sodium intake.  Check metabolic panel.  On lasix.  Discussed increase for a couple of days.  Follow

## 2020-10-23 DIAGNOSIS — M25521 Pain in right elbow: Secondary | ICD-10-CM | POA: Diagnosis not present

## 2020-10-23 DIAGNOSIS — M542 Cervicalgia: Secondary | ICD-10-CM | POA: Diagnosis not present

## 2020-10-23 DIAGNOSIS — M47812 Spondylosis without myelopathy or radiculopathy, cervical region: Secondary | ICD-10-CM | POA: Diagnosis not present

## 2020-10-24 ENCOUNTER — Telehealth: Payer: Self-pay | Admitting: Internal Medicine

## 2020-10-24 NOTE — Telephone Encounter (Signed)
Has oxygen that she is supposed to wear at night. She was reporting increased fatigue when here for her appt.  She was instructed to stop her 1/2 tablet sleeping pill.  She was going to try melatonin.  Also was going to start wearing her oxygen.  Please confirm if she is doing better.  If not, then I had discussed with her regarding reevaluation for sleep apnea.  If needed, let me know and I can place order for the referral.  Also, per last lab note, Fransisco Beau had informed her of her labs and to take extra lasix for two days.   She was also having increased abdominal pain.  Labs were ok.  If persistent pain, would like to obtain CT scan and/or refer her back to GI.

## 2020-10-25 ENCOUNTER — Other Ambulatory Visit: Payer: Self-pay | Admitting: Internal Medicine

## 2020-10-25 DIAGNOSIS — R1084 Generalized abdominal pain: Secondary | ICD-10-CM

## 2020-10-25 DIAGNOSIS — F331 Major depressive disorder, recurrent, moderate: Secondary | ICD-10-CM | POA: Diagnosis not present

## 2020-10-25 DIAGNOSIS — F411 Generalized anxiety disorder: Secondary | ICD-10-CM | POA: Diagnosis not present

## 2020-10-25 NOTE — Telephone Encounter (Signed)
Oxygen is helping. It takes her a little while to go to sleep but seems to be sleeping some better. She does not want to see GI again yet but she is agreeable to do the CT scan then will see GI if needed.

## 2020-10-25 NOTE — Telephone Encounter (Signed)
Order placed for CT abdomen.

## 2020-10-25 NOTE — Progress Notes (Signed)
Order placed for abdominal pain.

## 2020-10-31 ENCOUNTER — Other Ambulatory Visit: Payer: Self-pay

## 2020-10-31 ENCOUNTER — Other Ambulatory Visit (INDEPENDENT_AMBULATORY_CARE_PROVIDER_SITE_OTHER): Payer: PPO

## 2020-10-31 DIAGNOSIS — I10 Essential (primary) hypertension: Secondary | ICD-10-CM | POA: Diagnosis not present

## 2020-10-31 DIAGNOSIS — D649 Anemia, unspecified: Secondary | ICD-10-CM | POA: Diagnosis not present

## 2020-10-31 DIAGNOSIS — I509 Heart failure, unspecified: Secondary | ICD-10-CM

## 2020-10-31 LAB — BASIC METABOLIC PANEL
BUN: 24 mg/dL — ABNORMAL HIGH (ref 6–23)
CO2: 34 mEq/L — ABNORMAL HIGH (ref 19–32)
Calcium: 10 mg/dL (ref 8.4–10.5)
Chloride: 101 mEq/L (ref 96–112)
Creatinine, Ser: 1 mg/dL (ref 0.40–1.20)
GFR: 50.6 mL/min — ABNORMAL LOW (ref >60.00)
Glucose, Bld: 87 mg/dL (ref 70–99)
Potassium: 4.8 mEq/L (ref 3.5–5.1)
Sodium: 140 mEq/L (ref 135–145)

## 2020-10-31 LAB — CBC WITH DIFFERENTIAL/PLATELET
Basophils Absolute: 0 10*3/uL (ref 0.0–0.1)
Basophils Relative: 0.2 % (ref 0.0–3.0)
Eosinophils Absolute: 0.2 10*3/uL (ref 0.0–0.7)
Eosinophils Relative: 3.3 % (ref 0.0–5.0)
HCT: 34.4 % — ABNORMAL LOW (ref 36.0–46.0)
Hemoglobin: 11.2 g/dL — ABNORMAL LOW (ref 12.0–15.0)
Lymphocytes Relative: 31.7 % (ref 12.0–46.0)
Lymphs Abs: 2.1 10*3/uL (ref 0.7–4.0)
MCHC: 32.6 g/dL (ref 30.0–36.0)
MCV: 99.2 fl (ref 78.0–100.0)
Monocytes Absolute: 0.6 10*3/uL (ref 0.1–1.0)
Monocytes Relative: 9.1 % (ref 3.0–12.0)
Neutro Abs: 3.7 10*3/uL (ref 1.4–7.7)
Neutrophils Relative %: 55.7 % (ref 43.0–77.0)
Platelets: 158 10*3/uL (ref 150.0–400.0)
RBC: 3.47 Mil/uL — ABNORMAL LOW (ref 3.87–5.11)
RDW: 14.2 % (ref 11.5–15.5)
WBC: 6.6 10*3/uL (ref 4.0–10.5)

## 2020-10-31 LAB — HEPATIC FUNCTION PANEL
ALT: 10 U/L (ref 0–35)
AST: 15 U/L (ref 0–37)
Albumin: 3.9 g/dL (ref 3.5–5.2)
Alkaline Phosphatase: 83 U/L (ref 39–117)
Bilirubin, Direct: 0.1 mg/dL (ref 0.0–0.3)
Total Bilirubin: 0.5 mg/dL (ref 0.2–1.2)
Total Protein: 6.2 g/dL (ref 6.0–8.3)

## 2020-10-31 LAB — LIPID PANEL
Cholesterol: 136 mg/dL (ref 0–200)
HDL: 51.1 mg/dL (ref >39.00)
LDL Cholesterol: 65 mg/dL (ref 0–99)
NonHDL: 85.28
Total CHOL/HDL Ratio: 3
Triglycerides: 101 mg/dL (ref 0.0–149.0)
VLDL: 20.2 mg/dL (ref 0.0–40.0)

## 2020-10-31 LAB — VITAMIN B12: Vitamin B-12: 750 pg/mL (ref 211–911)

## 2020-11-01 ENCOUNTER — Other Ambulatory Visit: Payer: Self-pay | Admitting: Internal Medicine

## 2020-11-02 ENCOUNTER — Encounter: Payer: Self-pay | Admitting: Pulmonary Disease

## 2020-11-02 ENCOUNTER — Other Ambulatory Visit: Payer: Self-pay

## 2020-11-02 ENCOUNTER — Ambulatory Visit: Payer: PPO | Admitting: Pulmonary Disease

## 2020-11-02 VITALS — BP 112/72 | HR 55 | Temp 97.9°F | Ht 62.0 in | Wt 182.0 lb

## 2020-11-02 DIAGNOSIS — J4530 Mild persistent asthma, uncomplicated: Secondary | ICD-10-CM

## 2020-11-02 DIAGNOSIS — G4733 Obstructive sleep apnea (adult) (pediatric): Secondary | ICD-10-CM

## 2020-11-02 DIAGNOSIS — J9611 Chronic respiratory failure with hypoxia: Secondary | ICD-10-CM

## 2020-11-02 NOTE — Progress Notes (Signed)
Synopsis: Referred in April 2021 for post- covid by Einar Pheasant, MD.  Subjective:   PATIENT ID: Kimberly Roy GENDER: female DOB: 06/12/1931, MRN: [id number]   HPI  Chief Complaint  Patient presents with   Follow-up    SOB with exertion. Some wheezing   Kimberly Roy is an 87 year old woman, never smoker with HFpEF, mild aortic stenosis, pulmonary hypertension, nocturnal hypoxemia and kyphosis with post-viral reactive airways disease since having covid 12/2019 who returns for follow up.   She was started on symbicort 160-4.72mg in 01/2020 with improvement in her wheezing and cough. Her symbicort dose was reduced at the last visit in 07/07/2020 and she has not noticed any increase in her symptoms. She has occasional wheezing in which she uses albuterol with some relief. She is using 2L of oxygen at night. She has history of sleep apnea but did not tolerate the mask well in the past.  Past Medical History:  Diagnosis Date   Anemia    Anxiety    Chest pain    CHF (congestive heart failure) (HCC)    Constipation    DDD (degenerative disc disease), cervical    Depression    DVT (deep venous thrombosis) (HCC)    Dysphonia    Dyspnea    Fatty liver    Fatty liver    Headache    Hyperlipidemia    Hyperpiesia    Hypertension    Hypothyroidism    Interstitial cystitis    Left ventricular dysfunction    Lymphedema    Nephrolithiasis    Obstructive sleep apnea    Osteoarthritis    knees/cervical and lumbar spine   Pulmonary hypertension (HCC)    Pulmonary nodules    followed by Dr FRaul Del  Pure hypercholesterolemia    Renal cyst    right     Family History  Problem Relation Age of Onset   Heart disease Mother    Stroke Mother    Hypertension Mother    Heart disease Father        myocardial infarction age 85  Breast cancer Neg Hx      Social History   Socioeconomic History   Marital status: Widowed    Spouse name: Not on file    Number of children: 2   Years of education: Not on file   Highest education level: Not on file  Occupational History    Comment: insurance agency  Tobacco Use   Smoking status: Never Smoker   Smokeless tobacco: Never Used  Vaping Use   Vaping Use: Never used  Substance and Sexual Activity   Alcohol use: No    Alcohol/week: 0.0 standard drinks   Drug use: Never   Sexual activity: Not Currently  Other Topics Concern   Not on file  Social History Narrative   Not on file   Social Determinants of Health   Financial Resource Strain: Not on file  Food Insecurity: Not on file  Transportation Needs: Not on file  Physical Activity: Not on file  Stress: Not on file  Social Connections: Not on file  Intimate Partner Violence: Not on file     Allergies  Allergen Reactions   Lyrica [Pregabalin] Swelling   Omnicef [Cefdinir] Diarrhea and Nausea And Vomiting   Atarax [Hydroxyzine]     jittery   Dicyclomine Other (See Comments)    Abdominal bloating    Hydroxyzine Hcl     jittery   Levaquin [Levofloxacin] Swelling  Nitrofurantoin Diarrhea   Nucynta Er [Tapentadol Hcl Er] Other (See Comments)    Severe constipation    Oxybutynin Other (See Comments)    Blurred vision   Zoloft [Sertraline Hcl]     Severe headache   Biaxin [Clarithromycin] Other (See Comments) and Rash    Pt does not remember Pt does not remember   Sertraline Nausea And Vomiting    Severe headache Severe headache Other reaction(s): Headache Severe headache   Sulfa Antibiotics Rash    Pt does not remember   Sulfasalazine Rash    Pt does not remember   Tape Rash    Durabond - redness   Tapentadol Other (See Comments) and Rash    _0      Outpatient Medications Prior to Visit  Medication Sig Dispense Refill   albuterol (VENTOLIN HFA) 108 (90 Base) MCG/ACT inhaler INHALE 2 PUFFS  INTO THE LUNGS EVERY 6 (SIX) HOURS AS NEEDED FOR WHEEZING OR SHORTNESS OF BREATH 18 g 0   aluminum-magnesium hydroxide-simethicone (MAALOX) 219-471-25 MG/5ML SUSP Take 15 mLs by mouth 4 (four) times daily -  before meals and at bedtime.      amLODipine (NORVASC) 10 MG tablet TAKE 1 TABLET BY MOUTH DAILY 90 tablet 1   aspirin 81 MG EC tablet Take by mouth.     atorvastatin (LIPITOR) 20 MG tablet Take 1 tablet (20 mg total) by mouth at bedtime. 90 tablet 1   azelastine (ASTELIN) 0.1 % nasal spray PLACE 1 SPRAY INTO BOTH NOSTRILS TWO TIMES DAILY 30 mL 1   benazepril (LOTENSIN) 40 MG tablet TAKE 1 TABLET BY MOUTH DAILY 90 tablet 1   budesonide-formoterol (SYMBICORT) 80-4.5 MCG/ACT inhaler Inhale 2 puffs into the lungs 2 (two) times daily. 1 each 12   busPIRone (BUSPAR) 10 MG tablet Take 10 mg by mouth 2 (two) times daily.      calcium citrate-vitamin D (CITRACAL+D) 315-200 MG-UNIT tablet Take 1 tablet by mouth daily.     cephALEXin (KEFLEX) 250 MG capsule Take 250 mg by mouth daily.     Cholecalciferol (VITAMIN D3) 1000 UNITS CAPS Take by mouth.     DULoxetine (CYMBALTA) 60 MG capsule TAKE 1 CAPSULE BY MOUTH EVERY DAY. 30 capsule 3   fluocinonide (LIDEX) 0.05 % external solution Apply topically.     fluticasone (FLONASE) 50 MCG/ACT nasal spray Place 2 sprays into both nostrils daily. 16 g 1   furosemide (LASIX) 20 MG tablet TAKE 1 TABLET BY MOUTH EVERY DAY AS NEEDED 90 tablet 0   gabapentin (NEURONTIN) 300 MG capsule TAKE 2 CAPSULES BY MOUTH 3 TIMES A DAY. 540 capsule 2   hydrochlorothiazide (MICROZIDE) 12.5 MG capsule Take 12.5 mg by mouth daily.     HYDROcodone-acetaminophen (NORCO) 10-325 MG tablet Take 1 tablet by mouth 3 (three) times daily. 10 tablet 0   ketoconazole (NIZORAL) 2 % shampoo Apply 1 application topically 2 (two) times a week.     magnesium oxide (MAG-OX) 400 MG tablet Take 1 tablet (400 mg total) by mouth daily. 30 tablet 1   metoprolol succinate (TOPROL-XL)  100 MG 24 hr tablet TAKE 1 TABLET BY MOUTH TWICE A DAY WITH OR IMMEDIATELY FOLLOWING A MEAL. (Patient taking differently: Take 50 mg by mouth in the morning and at bedtime.) 180 tablet 1   morphine (MS CONTIN) 60 MG 12 hr tablet Take 1 tablet (60 mg total) by mouth 2 (two) times daily. 10 tablet 0   Naldemedine  Tosylate 0.2 MG TABS Take 0.2 mg by mouth daily.     polyethylene glycol powder (GLYCOLAX/MIRALAX) 17 GM/SCOOP powder MIX 17 GRAMS AS MARKED ON BOTTLE TOP IN 8 OUNCES OF WATER AND DRINK ONCE A DAY AS DIRECTED. 527 g 0   RABEprazole (ACIPHEX) 20 MG tablet TAKE 1 TABLET BY MOUTH 2 TIMES DAILY BEFORE A MEAL 180 tablet 1   senna (SENOKOT) 8.6 MG TABS tablet Take 1 tablet by mouth 2 (two) times daily.     Simethicone (GAS-X PO) Take 2 tablets by mouth 2 (two) times daily.     sodium chloride (OCEAN) 0.65 % nasal spray Place 1 spray into the nose as needed.     SYNTHROID 100 MCG tablet TAKE 1 TABLET BY MOUTH DAILY 90 tablet 3   traZODone (DESYREL) 50 MG tablet Take 25-50 mg by mouth at bedtime as needed.     No facility-administered medications prior to visit.    Review of Systems  Constitutional: Negative for chills, fever, malaise/fatigue and weight loss.  HENT: Negative for congestion, sinus pain and sore throat.   Eyes: Negative.   Respiratory: Positive for shortness of breath and wheezing. Negative for cough, hemoptysis and sputum production.   Cardiovascular: Negative for chest pain, palpitations, orthopnea, claudication and leg swelling.  Gastrointestinal: Negative for abdominal pain, heartburn, nausea and vomiting.  Genitourinary: Negative.   Musculoskeletal: Negative for joint pain and myalgias.  Skin: Negative for rash.  Neurological: Negative for weakness.  Endo/Heme/Allergies: Negative.   Psychiatric/Behavioral: Negative.     Objective:   Vitals:   11/02/20 1328  BP: 112/72  Pulse: (!) 55  Temp: 97.9 F (36.6 C)  SpO2: 93%  Weight: 182 lb (82.6 kg)   Height: _0  (1.575 m)     Physical Exam Constitutional:      General: She is not in acute distress.    Appearance: She is not ill-appearing.  HENT:     Head: Normocephalic and atraumatic.  Eyes:     General: No scleral icterus.    Conjunctiva/sclera: Conjunctivae normal.     Pupils: Pupils are equal, round, and reactive to light.  Cardiovascular:     Rate and Rhythm: Normal rate and regular rhythm.     Pulses: Normal pulses.     Heart sounds: Normal heart sounds. No murmur heard.   Pulmonary:     Effort: Pulmonary effort is normal.     Breath sounds: Normal breath sounds. No wheezing, rhonchi or rales.  Abdominal:     General: Bowel sounds are normal.     Palpations: Abdomen is soft.  Musculoskeletal:     Right lower leg: No edema.     Left lower leg: No edema.  Lymphadenopathy:     Cervical: No cervical adenopathy.  Skin:    General: Skin is warm and dry.  Neurological:     General: No focal deficit present.     Mental Status: She is alert.  Psychiatric:        Mood and Affect: Mood normal.        Behavior: Behavior normal.        Thought Content: Thought content normal.        Judgment: Judgment normal.    CBC    Component Value Date/Time   WBC 6.6 10/31/2020 0949   RBC 3.47 (L) 10/31/2020 0949   HGB 11.2 (L) 10/31/2020 0949   HGB 10.4 (L) 10/29/2019 0000   HCT 34.4 (L) 10/31/2020 0949   HCT 30.8 (L)  10/29/2019 0000   PLT 158.0 10/31/2020 0949   PLT 284 10/29/2019 0000   MCV 99.2 10/31/2020 0949   MCV 95 10/29/2019 0000   MCV 94 04/18/2014 0944   MCH 32.1 06/23/2020 1123   MCHC 32.6 10/31/2020 0949   RDW 14.2 10/31/2020 0949   RDW 12.5 10/29/2019 0000   RDW 14.8 (H) 04/18/2014 0944   LYMPHSABS 2.1 10/31/2020 0949   LYMPHSABS 2.0 10/29/2019 0000   LYMPHSABS 2.4 04/18/2014 0944   MONOABS 0.6 10/31/2020 0949   MONOABS 0.6 04/18/2014 0944   EOSABS 0.2 10/31/2020 0949   EOSABS 0.3 10/29/2019 0000   EOSABS 0.4 04/18/2014 0944   BASOSABS 0.0  10/31/2020 0949   BASOSABS 0.0 10/29/2019 0000   BASOSABS 0.0 04/18/2014 0944   BMP Latest Ref Rng & Units 10/31/2020 10/17/2020 08/11/2020  Glucose 70 - 99 mg/dL 87 95 83  BUN 6 - 23 mg/dL 24(H) 19 23  Creatinine 0.40 - 1.20 mg/dL 1.00 0.92 0.99  BUN/Creat Ratio 12 - 28 - - -  Sodium 135 - 145 mEq/L 140 140 139  Potassium 3.5 - 5.1 mEq/L 4.8 4.7 4.1  Chloride 96 - 112 mEq/L 101 101 100  CO2 19 - 32 mEq/L 34(H) 34(H) 31  Calcium 8.4 - 10.5 mg/dL 10.0 10.0 9.3   Chest imaging: HRCT Chest 12/29/19 1. No definitive evidence of interstitial lung disease. 2. Mild congestive heart failure. 3. Air trapping is indicative of small airways disease. 4. Scattered pulmonary nodules are similar to 07/15/2012 and considered benign. 5. Aortic atherosclerosis (ICD10-I70.0). Coronary artery Calcification.  PFT: PFT Results Latest Ref Rng & Units 01/17/2020  FVC-Pre L 1.71  FVC-Predicted Pre % 81  FVC-Post L 1.71  FVC-Predicted Post % 81  Pre FEV1/FVC % % 80  Post FEV1/FCV % % 83  FEV1-Pre L 1.36  FEV1-Predicted Pre % 88  FEV1-Post L 1.42  DLCO uncorrected ml/min/mmHg 11.24  DLCO UNC% % 65  DLCO corrected ml/min/mmHg 12.35  DLCO COR %Predicted % 71  DLVA Predicted % 100  TLC L 3.62  TLC % Predicted % 76  RV % Predicted % 81    Home Sleep Test 2014 Mild OSA, AHI 11/hr     Assessment & Plan:   Mild persistent asthma without complication  Chronic respiratory failure with hypoxia (HCC)  OSA (obstructive sleep apnea)  Discussion: Kimberly Roy is an 87 year old woman, never smoker with HFpEF, mild aortic stenosis, pulmonary hypertension, nocturnal hypoxemia and kyphosis with post-viral reactive airways disease since having covid 12/2019 who returns for follow up.   She is to continue on symbicort 80-4.57mg 2 puffs twice daily along with as needed albuterol.   She is to continue nocturnal oxygen.   Follow up in 6 months.  JFreda Jackson MD [facility] Office:  [phone]  Current Outpatient Medications:    albuterol (VENTOLIN HFA) 108 (90 Base) MCG/ACT inhaler, INHALE 2 PUFFS INTO THE LUNGS EVERY 6 (SIX) HOURS AS NEEDED FOR WHEEZING OR SHORTNESS OF BREATH, Disp: 18 g, Rfl: 0   aluminum-magnesium hydroxide-simethicone (MAALOX) 2094-709-62MG/5ML SUSP, Take 15 mLs by mouth 4 (four) times daily -  before meals and at bedtime. , Disp: , Rfl:    amLODipine (NORVASC) 10 MG tablet, TAKE 1 TABLET BY MOUTH DAILY, Disp: 90 tablet, Rfl: 1   aspirin 81 MG EC tablet, Take by mouth., Disp: , Rfl:    atorvastatin (LIPITOR) 20 MG tablet, Take 1 tablet (20 mg total) by mouth at bedtime.,  Disp: 90 tablet, Rfl: 1   azelastine (ASTELIN) 0.1 % nasal spray, PLACE 1 SPRAY INTO BOTH NOSTRILS TWO TIMES DAILY, Disp: 30 mL, Rfl: 1   benazepril (LOTENSIN) 40 MG tablet, TAKE 1 TABLET BY MOUTH DAILY, Disp: 90 tablet, Rfl: 1   budesonide-formoterol (SYMBICORT) 80-4.5 MCG/ACT inhaler, Inhale 2 puffs into the lungs 2 (two) times daily., Disp: 1 each, Rfl: 12   busPIRone (BUSPAR) 10 MG tablet, Take 10 mg by mouth 2 (two) times daily. , Disp: , Rfl:    calcium citrate-vitamin D (CITRACAL+D) 315-200 MG-UNIT tablet, Take 1 tablet by mouth daily., Disp: , Rfl:    cephALEXin (KEFLEX) 250 MG capsule, Take 250 mg by mouth daily., Disp: , Rfl:    Cholecalciferol (VITAMIN D3) 1000 UNITS CAPS, Take by mouth., Disp: , Rfl:    DULoxetine (CYMBALTA) 60 MG capsule, TAKE 1 CAPSULE BY MOUTH EVERY DAY., Disp: 30 capsule, Rfl: 3   fluocinonide (LIDEX) 0.05 % external solution, Apply topically., Disp: , Rfl:    fluticasone (FLONASE) 50 MCG/ACT nasal spray, Place 2 sprays into both nostrils daily., Disp: 16 g, Rfl: 1   furosemide (LASIX) 20 MG tablet, TAKE 1 TABLET BY MOUTH EVERY DAY AS NEEDED, Disp: 90 tablet, Rfl: 0   gabapentin (NEURONTIN) 300 MG capsule, TAKE 2 CAPSULES BY MOUTH 3 TIMES A DAY., Disp: 540 capsule, Rfl: 2   hydrochlorothiazide (MICROZIDE) 12.5 MG capsule, Take 12.5  mg by mouth daily., Disp: , Rfl:    HYDROcodone-acetaminophen (NORCO) 10-325 MG tablet, Take 1 tablet by mouth 3 (three) times daily., Disp: 10 tablet, Rfl: 0   ketoconazole (NIZORAL) 2 % shampoo, Apply 1 application topically 2 (two) times a week., Disp: , Rfl:    magnesium oxide (MAG-OX) 400 MG tablet, Take 1 tablet (400 mg total) by mouth daily., Disp: 30 tablet, Rfl: 1   metoprolol succinate (TOPROL-XL) 100 MG 24 hr tablet, TAKE 1 TABLET BY MOUTH TWICE A DAY WITH OR IMMEDIATELY FOLLOWING A MEAL. (Patient taking differently: Take 50 mg by mouth in the morning and at bedtime.), Disp: 180 tablet, Rfl: 1   morphine (MS CONTIN) 60 MG 12 hr tablet, Take 1 tablet (60 mg total) by mouth 2 (two) times daily., Disp: 10 tablet, Rfl: 0   Naldemedine Tosylate 0.2 MG TABS, Take 0.2 mg by mouth daily., Disp: , Rfl:    polyethylene glycol powder (GLYCOLAX/MIRALAX) 17 GM/SCOOP powder, MIX 17 GRAMS AS MARKED ON BOTTLE TOP IN 8 OUNCES OF WATER AND DRINK ONCE A DAY AS DIRECTED., Disp: 527 g, Rfl: 0   RABEprazole (ACIPHEX) 20 MG tablet, TAKE 1 TABLET BY MOUTH 2 TIMES DAILY BEFORE A MEAL, Disp: 180 tablet, Rfl: 1   senna (SENOKOT) 8.6 MG TABS tablet, Take 1 tablet by mouth 2 (two) times daily., Disp: , Rfl:    Simethicone (GAS-X PO), Take 2 tablets by mouth 2 (two) times daily., Disp: , Rfl:    sodium chloride (OCEAN) 0.65 % nasal spray, Place 1 spray into the nose as needed., Disp: , Rfl:    SYNTHROID 100 MCG tablet, TAKE 1 TABLET BY MOUTH DAILY, Disp: 90 tablet, Rfl: 3   traZODone (DESYREL) 50 MG tablet, Take 25-50 mg by mouth at bedtime as needed., Disp: , Rfl:

## 2020-11-02 NOTE — Patient Instructions (Addendum)
Continue symbicort 80-4.66mg 2 puffs twice daily - rinse mouth after each use  Use albuterol as needed 1-2 puffs every 4-6 hours for wheezing, cough, shortness of breath or chest tightness  Continue supplemental oxygen use at night

## 2020-11-03 ENCOUNTER — Encounter: Payer: Self-pay | Admitting: Pulmonary Disease

## 2020-11-03 DIAGNOSIS — I509 Heart failure, unspecified: Secondary | ICD-10-CM | POA: Diagnosis not present

## 2020-11-03 DIAGNOSIS — R350 Frequency of micturition: Secondary | ICD-10-CM | POA: Diagnosis not present

## 2020-11-07 DIAGNOSIS — N390 Urinary tract infection, site not specified: Secondary | ICD-10-CM | POA: Diagnosis not present

## 2020-11-07 DIAGNOSIS — N3010 Interstitial cystitis (chronic) without hematuria: Secondary | ICD-10-CM | POA: Diagnosis not present

## 2020-11-08 DIAGNOSIS — R339 Retention of urine, unspecified: Secondary | ICD-10-CM | POA: Diagnosis not present

## 2020-11-10 ENCOUNTER — Ambulatory Visit (INDEPENDENT_AMBULATORY_CARE_PROVIDER_SITE_OTHER): Payer: PPO | Admitting: Internal Medicine

## 2020-11-10 ENCOUNTER — Other Ambulatory Visit: Payer: Self-pay

## 2020-11-10 DIAGNOSIS — I890 Lymphedema, not elsewhere classified: Secondary | ICD-10-CM

## 2020-11-10 DIAGNOSIS — G629 Polyneuropathy, unspecified: Secondary | ICD-10-CM | POA: Diagnosis not present

## 2020-11-10 DIAGNOSIS — I509 Heart failure, unspecified: Secondary | ICD-10-CM

## 2020-11-10 DIAGNOSIS — K219 Gastro-esophageal reflux disease without esophagitis: Secondary | ICD-10-CM | POA: Diagnosis not present

## 2020-11-10 DIAGNOSIS — I10 Essential (primary) hypertension: Secondary | ICD-10-CM | POA: Diagnosis not present

## 2020-11-10 DIAGNOSIS — F439 Reaction to severe stress, unspecified: Secondary | ICD-10-CM

## 2020-11-10 DIAGNOSIS — E039 Hypothyroidism, unspecified: Secondary | ICD-10-CM

## 2020-11-10 DIAGNOSIS — E785 Hyperlipidemia, unspecified: Secondary | ICD-10-CM

## 2020-11-10 DIAGNOSIS — D649 Anemia, unspecified: Secondary | ICD-10-CM

## 2020-11-10 DIAGNOSIS — I272 Pulmonary hypertension, unspecified: Secondary | ICD-10-CM

## 2020-11-10 MED ORDER — MUPIROCIN 2 % EX OINT: 1.0000 "application " | TOPICAL_OINTMENT | Freq: Two times a day (BID) | CUTANEOUS | 0 refills | Status: DC

## 2020-11-10 NOTE — Progress Notes (Signed)
Patient ID: Kimberly Roy, female   DOB: 06-18-1931, 87 y.o.   MRN: [id number]   Subjective:    Patient ID: Kimberly Roy, female    DOB: 1931/02/22, 87 y.o.   MRN: [id number]  HPI This visit occurred during the SARS-CoV-2 public health emergency.  Safety protocols were in place, including screening questions prior to the visit, additional usage of staff PPE, and extensive cleaning of exam room while observing appropriate contact time as indicated for disinfecting solutions.  Patient here for a scheduled follow up.  She is accompanied by family member.  History obtained from both of them.  Overall she is doing relatively well.  Still having some abdominal discomfort.  Eating.  No nausea or vomiting.  Scheduled for abdominal CT.  Seeing urology for f/u IC - s/p bladder installation - note reviewed. No chest pain. Breathing stable.  Continue symbicort.  No increased cough or congestion.  Elbow better.  Off trazodone.  Not as groggy through the day.  Taking melatonin at night.  Blood pressure doing well.  Bowels moving.   Past Medical History:  Diagnosis Date  . Anemia   . Anxiety   . Chest pain   . CHF (congestive heart failure) ([place])   . Constipation   . DDD (degenerative disc disease), cervical   . Depression   . DVT (deep venous thrombosis) ([place])   . Dysphonia   . Dyspnea   . Fatty liver   . Fatty liver   . Headache   . Hyperlipidemia   . Hyperpiesia   . Hypertension   . Hypothyroidism   . Interstitial cystitis   . Left ventricular dysfunction   . Lymphedema   . Nephrolithiasis   . Obstructive sleep apnea   . Osteoarthritis    knees/cervical and lumbar spine  . Pulmonary hypertension ([place])   . Pulmonary nodules    followed by Dr Raul Del  . Pure hypercholesterolemia   . Renal cyst    right   Past Surgical History:  Procedure Laterality Date  . ABDOMINAL HYSTERECTOMY     ovaries left in place  . APPENDECTOMY    . Back Surgeries    . BACK SURGERY    . BREAST REDUCTION SURGERY      3/99  . CARDIAC CATHETERIZATION    . cataracts Bilateral   . CERVICAL SPINE SURGERY    . ESOPHAGEAL MANOMETRY N/A 08/02/2015   Procedure: ESOPHAGEAL MANOMETRY (EM);  Surgeon: Josefine Class, MD;  Location: [facility] ENDOSCOPY;  Service: Endoscopy;  Laterality: N/A;  . ESOPHAGOGASTRODUODENOSCOPY N/A 02/27/2015   Procedure: ESOPHAGOGASTRODUODENOSCOPY (EGD);  Surgeon: Hulen Luster, MD;  Location: [facility] ENDOSCOPY;  Service: Gastroenterology;  Laterality: N/A;  . ESOPHAGOGASTRODUODENOSCOPY (EGD) WITH PROPOFOL N/A 10/30/2018   Procedure: ESOPHAGOGASTRODUODENOSCOPY (EGD) WITH PROPOFOL;  Surgeon: Lollie Sails, MD;  Location: [facility] ENDOSCOPY;  Service: Endoscopy;  Laterality: N/A;  . EXCISIONAL HEMORRHOIDECTOMY    . EYE SURGERY    . FRACTURE SURGERY    . HEMORRHOID SURGERY    . HIP SURGERY  2013   Right hip surgery  . JOINT REPLACEMENT    . KNEE ARTHROSCOPY     left and right  . ORIF FEMUR FRACTURE Right 08/07/2018   Procedure: OPEN REDUCTION INTERNAL FIXATION (ORIF) DISTAL FEMUR FRACTURE;  Surgeon: Hessie Knows, MD;  Location: [facility] ORS;  Service: Orthopedics;  Laterality: Right;  . REDUCTION MAMMAPLASTY Bilateral YRS AGO  . REPLACEMENT TOTAL KNEE Bilateral   . rotator cuff surgery  blilateral  . TONSILECTOMY/ADENOIDECTOMY WITH MYRINGOTOMY    . VISCERAL ARTERY INTERVENTION N/A 02/25/2019   Procedure: VISCERAL ARTERY INTERVENTION;  Surgeon: Algernon Huxley, MD;  Location: [place] [facility];  Service: Cardiovascular;  Laterality: N/A;   Family History  Problem Relation Age of Onset  . Heart disease Mother   . Stroke Mother   . Hypertension Mother   . Heart disease Father        myocardial infarction age 53  . Breast cancer Neg Hx    Social History   Socioeconomic History  . Marital status: Widowed    Spouse name: Not on file  . Number of children: 2  . Years of education: Not on file  . Highest education level: Not on file  Occupational History    Comment: insurance agency   Tobacco Use  . Smoking status: Never Smoker  . Smokeless tobacco: Never Used  Vaping Use  . Vaping Use: Never used  Substance and Sexual Activity  . Alcohol use: No    Alcohol/week: 0.0 standard drinks  . Drug use: Never  . Sexual activity: Not Currently  Other Topics Concern  . Not on file  Social History Narrative  . Not on file   Social Determinants of Health   Financial Resource Strain: Low Risk   . Difficulty of Paying Living Expenses: Not hard at all  Food Insecurity: No Food Insecurity  . Worried About Charity fundraiser in the Last Year: Never true  . Ran Out of Food in the Last Year: Never true  Transportation Needs: No Transportation Needs  . Lack of Transportation (Medical): No  . Lack of Transportation (Non-Medical): No  Physical Activity: Not on file  Stress: No Stress Concern Present  . Feeling of Stress : Not at all  Social Connections: Unknown  . Frequency of Communication with Friends and Family: Not on file  . Frequency of Social Gatherings with Friends and Family: Twice a week  . Attends Religious Services: Not on file  . Active Member of Clubs or Organizations: Not on file  . Attends Archivist Meetings: Not on file  . Marital Status: Widowed    Outpatient Encounter Medications as of 11/10/2020  Medication Sig  . mupirocin ointment (BACTROBAN) 2 % Apply 1 application topically 2 (two) times daily.  Marland Kitchen albuterol (VENTOLIN HFA) 108 (90 Base) MCG/ACT inhaler INHALE 2 PUFFS INTO THE LUNGS EVERY 6 (SIX) HOURS AS NEEDED FOR WHEEZING OR SHORTNESS OF BREATH  . aluminum-magnesium hydroxide-simethicone (MAALOX) 353-614-43 MG/5ML SUSP Take 15 mLs by mouth 4 (four) times daily -  before meals and at bedtime.   Marland Kitchen amLODipine (NORVASC) 10 MG tablet TAKE 1 TABLET BY MOUTH DAILY  . aspirin 81 MG EC tablet Take by mouth.  Marland Kitchen atorvastatin (LIPITOR) 20 MG tablet Take 1 tablet (20 mg total) by mouth at bedtime.  Marland Kitchen azelastine (ASTELIN) 0.1 % nasal spray PLACE 1  SPRAY INTO BOTH NOSTRILS TWO TIMES DAILY  . benazepril (LOTENSIN) 40 MG tablet TAKE 1 TABLET BY MOUTH DAILY  . budesonide-formoterol (SYMBICORT) 80-4.5 MCG/ACT inhaler Inhale 2 puffs into the lungs 2 (two) times daily.  . busPIRone (BUSPAR) 10 MG tablet Take 10 mg by mouth 2 (two) times daily.   . calcium citrate-vitamin D (CITRACAL+D) 315-200 MG-UNIT tablet Take 1 tablet by mouth daily.  . cephALEXin (KEFLEX) 250 MG capsule Take 250 mg by mouth daily.  . Cholecalciferol (VITAMIN D3) 1000 UNITS CAPS Take by mouth.  . DULoxetine (CYMBALTA)  60 MG capsule TAKE 1 CAPSULE BY MOUTH EVERY DAY.  . fluocinonide (LIDEX) 0.05 % external solution Apply topically.  . fluticasone (FLONASE) 50 MCG/ACT nasal spray Place 2 sprays into both nostrils daily.  . furosemide (LASIX) 20 MG tablet TAKE 1 TABLET BY MOUTH EVERY DAY AS NEEDED  . gabapentin (NEURONTIN) 300 MG capsule TAKE 2 CAPSULES BY MOUTH 3 TIMES A DAY.  . hydrochlorothiazide (MICROZIDE) 12.5 MG capsule Take 12.5 mg by mouth daily.  Marland Kitchen HYDROcodone-acetaminophen (NORCO) 10-325 MG tablet Take 1 tablet by mouth 3 (three) times daily.  Marland Kitchen ketoconazole (NIZORAL) 2 % shampoo Apply 1 application topically 2 (two) times a week.  . magnesium oxide (MAG-OX) 400 MG tablet Take 1 tablet (400 mg total) by mouth daily.  . metoprolol succinate (TOPROL-XL) 100 MG 24 hr tablet TAKE 1 TABLET BY MOUTH TWICE A DAY WITH OR IMMEDIATELY FOLLOWING A MEAL. (Patient taking differently: Take 50 mg by mouth in the morning and at bedtime.)  . morphine (MS CONTIN) 60 MG 12 hr tablet Take 1 tablet (60 mg total) by mouth 2 (two) times daily.  . Naldemedine Tosylate 0.2 MG TABS Take 0.2 mg by mouth daily.  . polyethylene glycol powder (GLYCOLAX/MIRALAX) 17 GM/SCOOP powder MIX 17 GRAMS AS MARKED ON BOTTLE TOP IN 8 OUNCES OF WATER AND DRINK ONCE A DAY AS DIRECTED.  . RABEprazole (ACIPHEX) 20 MG tablet TAKE 1 TABLET BY MOUTH 2 TIMES DAILY BEFORE A MEAL  . senna (SENOKOT) 8.6 MG TABS tablet  Take 1 tablet by mouth 2 (two) times daily.  . Simethicone (GAS-X PO) Take 2 tablets by mouth 2 (two) times daily.  . sodium chloride (OCEAN) 0.65 % nasal spray Place 1 spray into the nose as needed.  Marland Kitchen SYNTHROID 100 MCG tablet TAKE 1 TABLET BY MOUTH DAILY  . traZODone (DESYREL) 50 MG tablet Take 25-50 mg by mouth at bedtime as needed.   No facility-administered encounter medications on file as of 11/10/2020.    Review of Systems  Constitutional: Negative for appetite change and unexpected weight change.  HENT: Negative for congestion and sinus pressure.   Respiratory: Negative for cough, chest tightness and shortness of breath.   Cardiovascular: Negative for chest pain and palpitations.       No increased swelling.  Wearing compression hose.   Gastrointestinal: Positive for abdominal pain. Negative for diarrhea, nausea and vomiting.  Genitourinary: Negative for difficulty urinating and dysuria.  Musculoskeletal: Negative for joint swelling and myalgias.  Skin: Negative for color change and rash.  Neurological: Negative for dizziness, light-headedness and headaches.  Psychiatric/Behavioral: Negative for agitation and dysphoric mood.       Objective:    Physical Exam Vitals reviewed.  Constitutional:      General: She is not in acute distress.    Appearance: Normal appearance.  HENT:     Head: Normocephalic and atraumatic.     Right Ear: External ear normal.     Left Ear: External ear normal.  Eyes:     General: No scleral icterus.       Right eye: No discharge.        Left eye: No discharge.     Conjunctiva/sclera: Conjunctivae normal.  Neck:     Thyroid: No thyromegaly.  Cardiovascular:     Rate and Rhythm: Normal rate and regular rhythm.  Pulmonary:     Effort: No respiratory distress.     Breath sounds: Normal breath sounds. No wheezing.  Abdominal:     General: Bowel  sounds are normal.     Palpations: Abdomen is soft.     Comments: Minimal tenderness to palpation.    Musculoskeletal:        General: No tenderness.     Cervical back: Neck supple. No tenderness.     Comments: No increased swelling.    Lymphadenopathy:     Cervical: No cervical adenopathy.  Skin:    Findings: No erythema or rash.  Neurological:     Mental Status: She is alert.  Psychiatric:        Mood and Affect: Mood normal.        Behavior: Behavior normal.     BP 120/60   Pulse 68   Temp 98.4 F (36.9 C) (Oral)   Resp 16   Ht _0  (1.575 m)   Wt 182 lb (82.6 kg)   SpO2 96%   BMI 33.29 kg/m  Wt Readings from Last 3 Encounters:  11/14/20 182 lb (82.6 kg)  11/10/20 182 lb (82.6 kg)  11/02/20 182 lb (82.6 kg)     Lab Results  Component Value Date   WBC 6.6 10/31/2020   HGB 11.2 (L) 10/31/2020   HCT 34.4 (L) 10/31/2020   PLT 158.0 10/31/2020   GLUCOSE 87 10/31/2020   CHOL 136 10/31/2020   TRIG 101.0 10/31/2020   HDL 51.10 10/31/2020   LDLCALC 65 10/31/2020   ALT 10 10/31/2020   AST 15 10/31/2020   NA 140 10/31/2020   K 4.8 10/31/2020   CL 101 10/31/2020   CREATININE 1.00 10/31/2020   BUN 24 (H) 10/31/2020   CO2 34 (H) 10/31/2020   TSH 1.17 08/11/2020   INR 0.89 08/06/2018   HGBA1C 6.0 10/19/2014    MM 3D SCREEN BREAST BILATERAL  Result Date: 02/25/2020 CLINICAL DATA:  Screening. EXAM: DIGITAL SCREENING BILATERAL MAMMOGRAM WITH TOMO AND CAD COMPARISON:  Previous exam(s). ACR Breast Density Category b: There are scattered areas of fibroglandular density. FINDINGS: There are no findings suspicious for malignancy. Images were processed with CAD. IMPRESSION: No mammographic evidence of malignancy. A result letter of this screening mammogram will be mailed directly to the patient. RECOMMENDATION: Screening mammogram in one year. (Code:SM-B-01Y) BI-RADS CATEGORY  1: Negative. Electronically Signed   By: Kristopher Oppenheim M.D.   On: 02/25/2020 13:26       Assessment & Plan:   Problem List Items Addressed This Visit    Anemia    Has been followed by  hematology.  Follow cbc.       CHF (congestive heart failure) (HCC)    Weight stable, down a couple of pounds from previous check.  On lasix.  Swelling improved.  Follow metabolic panel.       GERD (gastroesophageal reflux disease)    No upper symptoms reported.  On aciphex.        Hyperlipidemia    On lipitor.  Low cholesterol diet and exercise.  Follow lipid panel and liver function tests.        Hypertension    Blood pressure as outlined.  Continue amlodipine, lasix and metoprolol.  Follow pressures.  Follow metabolic panel.       Hypothyroidism    On thyroid replacement.  Follow tsh.       Lymphedema    Lower extremity swelling improved.  Continue compression hose.        Neuropathy    Continues on gabapentin.        Pulmonary hypertension ([place])    Has been followed by  pulmonary.        Stress    Followed by psychiatry.  Off trazodone.  Stopped last visit due to increased sedation in the am.  Feels she is doing better.  Melatonin q hs. Follow.            Einar Pheasant, MD

## 2020-11-14 ENCOUNTER — Ambulatory Visit (INDEPENDENT_AMBULATORY_CARE_PROVIDER_SITE_OTHER): Payer: PPO

## 2020-11-14 VITALS — Ht 62.0 in | Wt 182.0 lb

## 2020-11-14 DIAGNOSIS — Z0000 Encounter for general adult medical examination without abnormal findings: Secondary | ICD-10-CM | POA: Diagnosis not present

## 2020-11-14 NOTE — Patient Instructions (Addendum)
Ms. Kimberly Roy , Thank you for taking time to come for your Medicare Wellness Visit. I appreciate your ongoing commitment to your health goals. Please review the following plan we discussed and let me know if I can assist you in the future.   These are the goals we discussed: Goals      Patient Stated   .  I plan to start walking for exercise. (pt-stated)       This is a list of the screening recommended for you and due dates:  Health Maintenance  Topic Date Due  . COVID-19 Vaccine (3 - Booster for Pfizer series) 11/26/2020*  . Mammogram  02/22/2021  . Tetanus Vaccine  09/18/2027  . Flu Shot  Completed  . DEXA scan (bone density measurement)  Completed  . Pneumonia vaccines  Completed  . HPV Vaccine  Aged Out  *Topic was postponed. The date shown is not the original due date.    Immunizations Immunization History  Administered Date(s) Administered  . Fluad Quad(high Dose 65+) 05/31/2020  . Influenza Split 07/13/2009, 07/03/2012  . Influenza, High Dose Seasonal PF 05/13/2016, 06/11/2017, 06/05/2018, 06/07/2019  . Influenza,inj,Quad PF,6+ Mos 05/25/2013, 05/31/2014, 04/27/2015  . PFIZER(Purple Top)SARS-COV-2 Vaccination 12/16/2019, 01/14/2020  . Pneumococcal Conjugate-13 11/29/2014  . Pneumococcal-Unspecified 06/22/2001  . Tdap 09/17/2017  . Zoster Recombinat (Shingrix) 06/07/2019   Keep all routine maintenance appointments.   Follow up 03/02/21 @ 1:30  Mammogram and Dexa Scan future order placed. Patient verbalizes understanding to call and schedule on or after 02/22/21.   Advanced directives: End of life planning; Advance aging; Advanced directives discussed.  Copy of current HCPOA/Living Will requested.    Conditions/risks identified: none new.  Follow up in one year for your annual wellness visit.   Preventive Care 60 Years and Older, Female Preventive care refers to lifestyle choices and visits with your health care provider that can promote health and wellness. What  does preventive care include?  A yearly physical exam. This is also called an annual well check.  Dental exams once or twice a year.  Routine eye exams. Ask your health care provider how often you should have your eyes checked.  Personal lifestyle choices, including:  Daily care of your teeth and gums.  Regular physical activity.  Eating a healthy diet.  Avoiding tobacco and drug use.  Limiting alcohol use.  Practicing safe sex.  Taking low-dose aspirin every day.  Taking vitamin and mineral supplements as recommended by your health care provider. What happens during an annual well check? The services and screenings done by your health care provider during your annual well check will depend on your age, overall health, lifestyle risk factors, and family history of disease. Counseling  Your health care provider may ask you questions about your:  Alcohol use.  Tobacco use.  Drug use.  Emotional well-being.  Home and relationship well-being.  Sexual activity.  Eating habits.  History of falls.  Memory and ability to understand (cognition).  Work and work Statistician.  Reproductive health. Screening  You may have the following tests or measurements:  Height, weight, and BMI.  Blood pressure.  Lipid and cholesterol levels. These may be checked every 5 years, or more frequently if you are over 57 years old.  Skin check.  Lung cancer screening. You may have this screening every year starting at age 75 if you have a 30-pack-year history of smoking and currently smoke or have quit within the past 15 years.  Fecal occult blood test (FOBT)  of the stool. You may have this test every year starting at age 7.  Flexible sigmoidoscopy or colonoscopy. You may have a sigmoidoscopy every 5 years or a colonoscopy every 10 years starting at age 88.  Hepatitis C blood test.  Hepatitis B blood test.  Sexually transmitted disease (STD) testing.  Diabetes screening.  This is done by checking your blood sugar (glucose) after you have not eaten for a while (fasting). You may have this done every 1-3 years.  Bone density scan. This is done to screen for osteoporosis. You may have this done starting at age 73.  Mammogram. This may be done every 1-2 years. Talk to your health care provider about how often you should have regular mammograms. Talk with your health care provider about your test results, treatment options, and if necessary, the need for more tests. Vaccines  Your health care provider may recommend certain vaccines, such as:  Influenza vaccine. This is recommended every year.  Tetanus, diphtheria, and acellular pertussis (Tdap, Td) vaccine. You may need a Td booster every 10 years.  Zoster vaccine. You may need this after age 67.  Pneumococcal 13-valent conjugate (PCV13) vaccine. One dose is recommended after age 37.  Pneumococcal polysaccharide (PPSV23) vaccine. One dose is recommended after age 37. Talk to your health care provider about which screenings and vaccines you need and how often you need them. This information is not intended to replace advice given to you by your health care provider. Make sure you discuss any questions you have with your health care provider. Document Released: 09/15/2015 Document Revised: 05/08/2016 Document Reviewed: 06/20/2015 Elsevier Interactive Patient Education  2017 [place] Prevention in the Home Falls can cause injuries. They can happen to people of all ages. There are many things you can do to make your home safe and to help prevent falls. What can I do on the outside of my home?  Regularly fix the edges of walkways and driveways and fix any cracks.  Remove anything that might make you trip as you walk through a door, such as a raised step or threshold.  Trim any bushes or trees on the path to your home.  Use bright outdoor lighting.  Clear any walking paths of anything that might  make someone trip, such as rocks or tools.  Regularly check to see if handrails are loose or broken. Make sure that both sides of any steps have handrails.  Any raised decks and porches should have guardrails on the edges.  Have any leaves, snow, or ice cleared regularly.  Use sand or salt on walking paths during winter.  Clean up any spills in your garage right away. This includes oil or grease spills. What can I do in the bathroom?  Use night lights.  Install grab bars by the toilet and in the tub and shower. Do not use towel bars as grab bars.  Use non-skid mats or decals in the tub or shower.  If you need to sit down in the shower, use a plastic, non-slip stool.  Keep the floor dry. Clean up any water that spills on the floor as soon as it happens.  Remove soap buildup in the tub or shower regularly.  Attach bath mats securely with double-sided non-slip rug tape.  Do not have throw rugs and other things on the floor that can make you trip. What can I do in the bedroom?  Use night lights.  Make sure that you have a light by  your bed that is easy to reach.  Do not use any sheets or blankets that are too big for your bed. They should not hang down onto the floor.  Have a firm chair that has side arms. You can use this for support while you get dressed.  Do not have throw rugs and other things on the floor that can make you trip. What can I do in the kitchen?  Clean up any spills right away.  Avoid walking on wet floors.  Keep items that you use a lot in easy-to-reach places.  If you need to reach something above you, use a strong step stool that has a grab bar.  Keep electrical cords out of the way.  Do not use floor polish or wax that makes floors slippery. If you must use wax, use non-skid floor wax.  Do not have throw rugs and other things on the floor that can make you trip. What can I do with my stairs?  Do not leave any items on the stairs.  Make sure  that there are handrails on both sides of the stairs and use them. Fix handrails that are broken or loose. Make sure that handrails are as long as the stairways.  Check any carpeting to make sure that it is firmly attached to the stairs. Fix any carpet that is loose or worn.  Avoid having throw rugs at the top or bottom of the stairs. If you do have throw rugs, attach them to the floor with carpet tape.  Make sure that you have a light switch at the top of the stairs and the bottom of the stairs. If you do not have them, ask someone to add them for you. What else can I do to help prevent falls?  Wear shoes that:  Do not have high heels.  Have rubber bottoms.  Are comfortable and fit you well.  Are closed at the toe. Do not wear sandals.  If you use a stepladder:  Make sure that it is fully opened. Do not climb a closed stepladder.  Make sure that both sides of the stepladder are locked into place.  Ask someone to hold it for you, if possible.  Clearly mark and make sure that you can see:  Any grab bars or handrails.  First and last steps.  Where the edge of each step is.  Use tools that help you move around (mobility aids) if they are needed. These include:  Canes.  Walkers.  Scooters.  Crutches.  Turn on the lights when you go into a dark area. Replace any light bulbs as soon as they burn out.  Set up your furniture so you have a clear path. Avoid moving your furniture around.  If any of your floors are uneven, fix them.  If there are any pets around you, be aware of where they are.  Review your medicines with your doctor. Some medicines can make you feel dizzy. This can increase your chance of falling. Ask your doctor what other things that you can do to help prevent falls. This information is not intended to replace advice given to you by your health care provider. Make sure you discuss any questions you have with your health care provider. Document  Released: 06/15/2009 Document Revised: 01/25/2016 Document Reviewed: 09/23/2014 Elsevier Interactive Patient Education  2017 [employer].

## 2020-11-14 NOTE — Progress Notes (Signed)
Subjective:   Kimberly Roy is a 87 y.o. female who presents for Medicare Annual (Subsequent) preventive examination.  Review of Systems    No ROS.  Medicare Wellness Virtual Visit.     Cardiac Risk Factors include: advanced age (>62mn, >>39women);hypertension     Objective:    Today's Vitals   11/14/20 1406  Weight: 182 lb (82.6 kg)  Height: _0  (1.575 m)   Body mass index is 33.29 kg/m.  Advanced Directives 11/14/2020 11/12/2019 02/25/2019 11/11/2018 11/03/2018 10/30/2018 08/28/2018  Does Patient Have a Medical Advance Directive? _1  Yes Yes  Type of AParamedicof AParksLiving will Healthcare Power of AGlen HeadLiving will Living will;Healthcare Power of AShioctonLiving will - HSt. JosephLiving will  Does patient want to make changes to medical advance directive? No - Patient declined No - Patient declined - - No - Patient declined - No - Patient declined  Copy of HPigeon Fallsin Chart? No - copy requested No - copy requested No - copy requested No - copy requested No - copy requested - -  Would patient like information on creating a medical advance directive? - - - - No - Patient declined - -    Current Medications (verified) Outpatient Encounter Medications as of 11/14/2020  Medication Sig  . albuterol (VENTOLIN HFA) 108 (90 Base) MCG/ACT inhaler INHALE 2 PUFFS INTO THE LUNGS EVERY 6 (SIX) HOURS AS NEEDED FOR WHEEZING OR SHORTNESS OF BREATH  . aluminum-magnesium hydroxide-simethicone (MAALOX) 200-200-20 MG/5ML SUSP Take 15 mLs by mouth 4 (four) times daily -  before meals and at bedtime.   .Marland KitchenamLODipine (NORVASC) 10 MG tablet TAKE 1 TABLET BY MOUTH DAILY  . aspirin 81 MG EC tablet Take by mouth.  .Marland Kitchenatorvastatin (LIPITOR) 20 MG tablet Take 1 tablet (20 mg total) by mouth at bedtime.  .Marland Kitchenazelastine (ASTELIN) 0.1 % nasal spray PLACE 1 SPRAY INTO BOTH  NOSTRILS TWO TIMES DAILY  . benazepril (LOTENSIN) 40 MG tablet TAKE 1 TABLET BY MOUTH DAILY  . budesonide-formoterol (SYMBICORT) 80-4.5 MCG/ACT inhaler Inhale 2 puffs into the lungs 2 (two) times daily.  . busPIRone (BUSPAR) 10 MG tablet Take 10 mg by mouth 2 (two) times daily.   . calcium citrate-vitamin D (CITRACAL+D) 315-200 MG-UNIT tablet Take 1 tablet by mouth daily.  . cephALEXin (KEFLEX) 250 MG capsule Take 250 mg by mouth daily.  . Cholecalciferol (VITAMIN D3) 1000 UNITS CAPS Take by mouth.  . DULoxetine (CYMBALTA) 60 MG capsule TAKE 1 CAPSULE BY MOUTH EVERY DAY.  . fluocinonide (LIDEX) 0.05 % external solution Apply topically.  . fluticasone (FLONASE) 50 MCG/ACT nasal spray Place 2 sprays into both nostrils daily.  . furosemide (LASIX) 20 MG tablet TAKE 1 TABLET BY MOUTH EVERY DAY AS NEEDED  . gabapentin (NEURONTIN) 300 MG capsule TAKE 2 CAPSULES BY MOUTH 3 TIMES A DAY.  . hydrochlorothiazide (MICROZIDE) 12.5 MG capsule Take 12.5 mg by mouth daily.  .Marland KitchenHYDROcodone-acetaminophen (NORCO) 10-325 MG tablet Take 1 tablet by mouth 3 (three) times daily.  .Marland Kitchenketoconazole (NIZORAL) 2 % shampoo Apply 1 application topically 2 (two) times a week.  . magnesium oxide (MAG-OX) 400 MG tablet Take 1 tablet (400 mg total) by mouth daily.  . metoprolol succinate (TOPROL-XL) 100 MG 24 hr tablet TAKE 1 TABLET BY MOUTH TWICE A DAY WITH OR IMMEDIATELY FOLLOWING A MEAL. (Patient taking differently: Take 50 mg by mouth  in the morning and at bedtime.)  . morphine (MS CONTIN) 60 MG 12 hr tablet Take 1 tablet (60 mg total) by mouth 2 (two) times daily.  . mupirocin ointment (BACTROBAN) 2 % Apply 1 application topically 2 (two) times daily.  . Naldemedine Tosylate 0.2 MG TABS Take 0.2 mg by mouth daily.  . polyethylene glycol powder (GLYCOLAX/MIRALAX) 17 GM/SCOOP powder MIX 17 GRAMS AS MARKED ON BOTTLE TOP IN 8 OUNCES OF WATER AND DRINK ONCE A DAY AS DIRECTED.  . RABEprazole (ACIPHEX) 20 MG tablet TAKE 1 TABLET BY  MOUTH 2 TIMES DAILY BEFORE A MEAL  . senna (SENOKOT) 8.6 MG TABS tablet Take 1 tablet by mouth 2 (two) times daily.  . Simethicone (GAS-X PO) Take 2 tablets by mouth 2 (two) times daily.  . sodium chloride (OCEAN) 0.65 % nasal spray Place 1 spray into the nose as needed.  Marland Kitchen SYNTHROID 100 MCG tablet TAKE 1 TABLET BY MOUTH DAILY  . traZODone (DESYREL) 50 MG tablet Take 25-50 mg by mouth at bedtime as needed.   No facility-administered encounter medications on file as of 11/14/2020.    Allergies (verified) Lyrica [pregabalin], Omnicef [cefdinir], Atarax [hydroxyzine], Dicyclomine, Hydroxyzine hcl, Levaquin [levofloxacin], Nitrofurantoin, Nucynta er [tapentadol hcl er], Oxybutynin, Zoloft [sertraline hcl], Biaxin [clarithromycin], Sertraline, Sulfa antibiotics, Sulfasalazine, Tape, and Tapentadol   History: Past Medical History:  Diagnosis Date  . Anemia   . Anxiety   . Chest pain   . CHF (congestive heart failure) ([place])   . Constipation   . DDD (degenerative disc disease), cervical   . Depression   . DVT (deep venous thrombosis) ([place])   . Dysphonia   . Dyspnea   . Fatty liver   . Fatty liver   . Headache   . Hyperlipidemia   . Hyperpiesia   . Hypertension   . Hypothyroidism   . Interstitial cystitis   . Left ventricular dysfunction   . Lymphedema   . Nephrolithiasis   . Obstructive sleep apnea   . Osteoarthritis    knees/cervical and lumbar spine  . Pulmonary hypertension ([place])   . Pulmonary nodules    followed by Dr Raul Del  . Pure hypercholesterolemia   . Renal cyst    right   Past Surgical History:  Procedure Laterality Date  . ABDOMINAL HYSTERECTOMY     ovaries left in place  . APPENDECTOMY    . Back Surgeries    . BACK SURGERY    . BREAST REDUCTION SURGERY     3/99  . CARDIAC CATHETERIZATION    . cataracts Bilateral   . CERVICAL SPINE SURGERY    . ESOPHAGEAL MANOMETRY N/A 08/02/2015   Procedure: ESOPHAGEAL MANOMETRY (EM);  Surgeon: Josefine Class, MD;   [facility] ENDOSCOPY;  Service: Endoscopy;  Laterality: N/A;  . ESOPHAGOGASTRODUODENOSCOPY N/A 02/27/2015   Procedure: ESOPHAGOGASTRODUODENOSCOPY (EGD);  Surgeon: Hulen Luster, MD;  Location: [facility] ENDOSCOPY;  Service: Gastroenterology;  Laterality: N/A;  . ESOPHAGOGASTRODUODENOSCOPY (EGD) WITH PROPOFOL N/A 10/30/2018   Procedure: ESOPHAGOGASTRODUODENOSCOPY (EGD) WITH PROPOFOL;  Surgeon: Lollie Sails, MD;  Location: [facility] ENDOSCOPY;  Service: Endoscopy;  Laterality: N/A;  . EXCISIONAL HEMORRHOIDECTOMY    . EYE SURGERY    . FRACTURE SURGERY    . HEMORRHOID SURGERY    . HIP SURGERY  2013   Right hip surgery  . JOINT REPLACEMENT    . KNEE ARTHROSCOPY     left and right  . ORIF FEMUR FRACTURE Right 08/07/2018   Procedure: OPEN REDUCTION INTERNAL  FIXATION (ORIF) DISTAL FEMUR FRACTURE;  Surgeon: Hessie Knows, MD;  Location: [facility] ORS;  Service: Orthopedics;  Laterality: Right;  . REDUCTION MAMMAPLASTY Bilateral YRS AGO  . REPLACEMENT TOTAL KNEE Bilateral   . rotator cuff surgery     blilateral  . TONSILECTOMY/ADENOIDECTOMY WITH MYRINGOTOMY    . VISCERAL ARTERY INTERVENTION N/A 02/25/2019   Procedure: VISCERAL ARTERY INTERVENTION;  Surgeon: Algernon Huxley, MD;  Location: [facility] CV LAB;  Service: Cardiovascular;  Laterality: N/A;   Family History  Problem Relation Age of Onset  . Heart disease Mother   . Stroke Mother   . Hypertension Mother   . Heart disease Father        myocardial infarction age 32  . Breast cancer Neg Hx    Social History   Socioeconomic History  . Marital status: Widowed    Spouse name: Not on file  . Number of children: 2  . Years of education: Not on file  . Highest education level: Not on file  Occupational History    Comment: insurance agency  Tobacco Use  . Smoking status: Never Smoker  . Smokeless tobacco: Never Used  Vaping Use  . Vaping Use: Never used  Substance and Sexual Activity  . Alcohol use: No    Alcohol/week: 0.0 standard drinks   . Drug use: Never  . Sexual activity: Not Currently  Other Topics Concern  . Not on file  Social History Narrative  . Not on file   Social Determinants of Health   Financial Resource Strain: Low Risk   . Difficulty of Paying Living Expenses: Not hard at all  Food Insecurity: No Food Insecurity  . Worried About Charity fundraiser in the Last Year: Never true  . Ran Out of Food in the Last Year: Never true  Transportation Needs: No Transportation Needs  . Lack of Transportation (Medical): No  . Lack of Transportation (Non-Medical): No  Physical Activity: Not on file  Stress: No Stress Concern Present  . Feeling of Stress : Not at all  Social Connections: Unknown  . Frequency of Communication with Friends and Family: Not on file  . Frequency of Social Gatherings with Friends and Family: Twice a week  . Attends Religious Services: Not on file  . Active Member of Clubs or Organizations: Not on file  . Attends Archivist Meetings: Not on file  . Marital Status: Widowed    Tobacco Counseling Counseling given: Not Answered   Clinical Intake:  Pre-visit preparation completed: Yes        Diabetes: No  How often do you need to have someone help you when you read instructions, pamphlets, or other written materials from your doctor or pharmacy?: 1 - Never   Interpreter Needed?: No      Activities of Daily Living In your present state of health, do you have any difficulty performing the following activities: 11/14/2020  Hearing? Y  Vision? N  Difficulty concentrating or making decisions? N  Walking or climbing stairs? Y  Dressing or bathing? N  Doing errands, shopping? Y  Preparing Food and eating ? Y  Comment Meals on wheels. Self feeds.  Using the Toilet? N  In the past six months, have you accidently leaked urine? N  Do you have problems with loss of bowel control? N  Managing your Medications? N  Managing your Finances? N  Housekeeping or managing  your Housekeeping? Y  Some recent data might be hidden    Patient  Care Team: Einar Pheasant, [name], [name], [name], [name], MD as Consulting Physician (Hematology and Oncology)  Indicate any recent Medical Services you may have received from other than [facility] providers in the past year (date may be approximate).     Assessment:   This is a routine wellness examination for Araya.  I connected with Diannah today by telephone and verified that I am speaking with the correct person using two identifiers. Location patient: home Location provider: work Persons participating in the virtual visit: patient, Marine scientist.    I discussed the limitations, risks, security and privacy concerns of performing an evaluation and management service by telephone and the availability of in person appointments. The patient expressed understanding and verbally consented to this telephonic visit.    Interactive audio and video telecommunications were attempted between this provider and patient, however failed, due to patient having technical difficulties OR patient did not have access to video capability.  We continued and completed visit with audio only.  Some vital signs may be absent or patient reported.   Hearing/Vision screen  Hearing Screening   125Hz 250Hz 500Hz 1000Hz 2000Hz 3000Hz 4000Hz 6000Hz 8000Hz  Right ear:           Left ear:           Comments: Followed by [facility]  Visits as needed  Hearing aid, bilateral  Vision Screening Comments: Followed by [facility]  Cataract extraction, bilateral   Dietary issues and exercise activities discussed: Current Exercise Habits: The patient does not participate in regular exercise at present, Intensity: Mild  Goals      Patient Stated   .  I plan to start walking for exercise.  (pt-stated)      Depression Screen PHQ 2/9 Scores 11/14/2020 10/17/2020 08/11/2020 11/12/2019 10/04/2019 11/11/2018 08/28/2018  PHQ - 2 Score 0 0 0 0 0 - 0  PHQ- 9 Score - - - 0 0 - -  Exception Documentation - - - - - Patient refusal -    Fall Risk Fall Risk  11/14/2020 10/17/2020 08/11/2020 11/12/2019 10/04/2019  Falls in the past year? 0 0 0 0 0  Comment - - - - -  Number falls in past yr: 0 0 0 - 0  Injury with Fall? 0 0 0 - 0  Comment - - - - -  Risk for fall due to : Impaired balance/gait - - - -  Follow up _0     FALL RISK PREVENTION PERTAINING TO THE HOME: Handrails in use when climbing stairs? Yes Home free of loose throw rugs in walkways, pet beds, electrical cords, etc? Yes  Adequate lighting in your home to reduce risk of falls? Yes   ASSISTIVE DEVICES UTILIZED TO PREVENT FALLS: Life alert? No  Use of a cane, walker or w/c? Yes    Grab bars in the bathroom? Yes  Shower chair or bench in shower? Yes  Elevated toilet seat or a handicapped toilet? Yes   TIMED UP AND GO: Was the test performed? No . Virtual visit.  Cognitive Function: Patient is alert and oriented x3.  Denies difficulty focusing, making decisions, memory loss.  Manages her own finances and enjoys some brain engaging games.  MMSE/6CIT deferred. Normal by direct communication/observation.   MMSE - Mini Mental State Exam 03/08/2015  Orientation to time 5  Orientation to Place 5  Registration 3  Attention/ Calculation 5  Recall 3  Language- name 2 objects 2  Language- repeat 1  Language- follow 3 step command 3  Language- read & follow direction 1  Write a sentence 1  Copy design 1  Total score 30     6CIT Screen 11/11/2018 09/10/2017 08/02/2016  What Year? 0 points 0 points 0 points  What month? 0 points 0 points 0 points  What time? 0 points 0 points 0 points  Count back  from 20 0 points 0 points 0 points  Months in reverse 0 points 0 points 0 points  Repeat phrase 0 points 0 points 0 points  Total Score 0 0 0    Immunizations Immunization History  Administered Date(s) Administered  . Fluad Quad(high Dose 65+) 05/31/2020  . Influenza Split 07/13/2009, 07/03/2012  . Influenza, High Dose Seasonal PF 05/13/2016, 06/11/2017, 06/05/2018, 06/07/2019  . Influenza,inj,Quad PF,6+ Mos 05/25/2013, 05/31/2014, 04/27/2015  . PFIZER(Purple Top)SARS-COV-2 Vaccination 12/16/2019, 01/14/2020  . Pneumococcal Conjugate-13 11/29/2014  . Pneumococcal-Unspecified 06/22/2001  . Tdap 09/17/2017  . Zoster Recombinat (Shingrix) 06/07/2019    Health Maintenance There are no preventive care reminders to display for this patient. Health Maintenance  Topic Date Due  . COVID-19 Vaccine (3 - Booster for [place] series) 11/26/2020 (Originally 07/16/2020)  . MAMMOGRAM  02/22/2021  . TETANUS/TDAP  09/18/2027  . INFLUENZA VACCINE  Completed  . DEXA SCAN  Completed  . PNA vac Low Risk Adult  Completed  . HPV VACCINES  Aged Out   Colorectal cancer screening: No longer required.   Mammogram status: Completed 02/23/20. Repeat every year  Bone Density status: Completed 03/18/17. Results reflect: Bone density results: OSTEOPOROSIS. Repeat every 2 years. Ordered per patient request.   Lung Cancer Screening: (Low Dose CT Chest recommended if Age 31-80 years, 30 pack-year currently smoking OR have quit w/in 15years.) does not qualify.     Vision Screening: Recommended annual ophthalmology exams for early detection of glaucoma and other disorders of the eye. Is the patient up to date with their annual eye exam?  Yes  Who is the provider or what is the name of the office in which the patient attends annual eye exams? [place]   Dental Screening: Recommended annual dental exams for proper oral hygiene. Visits every 6 months.  Community Resource Referral / Chronic Care  Management: CRR required this visit?  No   CCM required this visit?  No      Plan:   Keep all routine maintenance appointments.   Follow up 03/02/21 @ 1:30  Notes tenderness to last/baby toe discussed with pcp during last visit is feeling much better.   Mammogram and Dexa Scan future order placed. Patient verbalizes understanding to call and schedule on or after 02/22/21.   I have personally reviewed and noted the following in the patient's chart:   . Medical and social history . Use of alcohol, tobacco or illicit drugs  . Current medications and supplements . Functional ability and status . Nutritional status . Physical activity . Advanced directives . List of other physicians . Hospitalizations, surgeries, and ER visits in previous 12 months . Vitals . Screenings to include cognitive, depression, and falls . Referrals and appointments  In addition, I have reviewed and discussed with patient certain preventive protocols, quality metrics, and best practice recommendations. A written personalized care plan for preventive services as well as general preventive health recommendations were provided  to patient via mychart.     Varney Biles, LPN   [id number]

## 2020-11-15 DIAGNOSIS — F331 Major depressive disorder, recurrent, moderate: Secondary | ICD-10-CM | POA: Diagnosis not present

## 2020-11-15 DIAGNOSIS — F411 Generalized anxiety disorder: Secondary | ICD-10-CM | POA: Diagnosis not present

## 2020-11-16 ENCOUNTER — Ambulatory Visit
Admission: RE | Admit: 2020-11-16 | Discharge: 2020-11-16 | Disposition: A | Discharge status: Home / Self Care | Payer: PPO | Source: Ambulatory Visit | Attending: Internal Medicine | Admitting: Internal Medicine

## 2020-11-16 ENCOUNTER — Other Ambulatory Visit: Payer: Self-pay

## 2020-11-16 DIAGNOSIS — R1084 Generalized abdominal pain: Secondary | ICD-10-CM | POA: Insufficient documentation

## 2020-11-16 DIAGNOSIS — D3501 Benign neoplasm of right adrenal gland: Secondary | ICD-10-CM | POA: Diagnosis not present

## 2020-11-19 ENCOUNTER — Encounter: Payer: Self-pay | Admitting: Internal Medicine

## 2020-11-19 NOTE — Assessment & Plan Note (Signed)
On lipitor.  Low cholesterol diet and exercise.  Follow lipid panel and liver function tests.   

## 2020-11-19 NOTE — Assessment & Plan Note (Signed)
Has been followed by hematology.  Follow cbc.

## 2020-11-19 NOTE — Assessment & Plan Note (Signed)
Followed by psychiatry.  Off trazodone.  Stopped last visit due to increased sedation in the am.  Feels she is doing better.  Melatonin q hs. Follow.

## 2020-11-19 NOTE — Assessment & Plan Note (Signed)
Weight stable, down a couple of pounds from previous check.  On lasix.  Swelling improved.  Follow metabolic panel.

## 2020-11-19 NOTE — Assessment & Plan Note (Signed)
On thyroid replacement.  Follow tsh.  

## 2020-11-19 NOTE — Assessment & Plan Note (Signed)
Has been followed by pulmonary.

## 2020-11-19 NOTE — Assessment & Plan Note (Signed)
Blood pressure as outlined.  Continue amlodipine, lasix and metoprolol.  Follow pressures.  Follow metabolic panel.

## 2020-11-19 NOTE — Assessment & Plan Note (Signed)
Continues on gabapentin.

## 2020-11-19 NOTE — Assessment & Plan Note (Signed)
No upper symptoms reported.  On aciphex.

## 2020-11-19 NOTE — Assessment & Plan Note (Signed)
Lower extremity swelling improved.  Continue compression hose.

## 2020-11-20 ENCOUNTER — Encounter: Payer: Self-pay | Admitting: Internal Medicine

## 2020-11-20 ENCOUNTER — Other Ambulatory Visit: Payer: Self-pay | Admitting: Internal Medicine

## 2020-11-20 DIAGNOSIS — I700 Atherosclerosis of aorta: Secondary | ICD-10-CM | POA: Insufficient documentation

## 2020-11-21 ENCOUNTER — Other Ambulatory Visit: Payer: Self-pay | Admitting: Internal Medicine

## 2020-11-27 ENCOUNTER — Other Ambulatory Visit: Payer: Self-pay | Admitting: Internal Medicine

## 2020-11-29 ENCOUNTER — Other Ambulatory Visit: Payer: Self-pay | Admitting: Internal Medicine

## 2020-11-29 DIAGNOSIS — R1084 Generalized abdominal pain: Secondary | ICD-10-CM

## 2020-11-29 NOTE — Progress Notes (Signed)
Order placed for GI referral.   

## 2020-11-30 ENCOUNTER — Other Ambulatory Visit: Payer: Self-pay | Admitting: Internal Medicine

## 2020-12-04 DIAGNOSIS — S76312A Strain of muscle, fascia and tendon of the posterior muscle group at thigh level, left thigh, initial encounter: Secondary | ICD-10-CM | POA: Diagnosis not present

## 2020-12-04 DIAGNOSIS — Q675 Congenital deformity of spine: Secondary | ICD-10-CM | POA: Diagnosis not present

## 2020-12-04 DIAGNOSIS — M19012 Primary osteoarthritis, left shoulder: Secondary | ICD-10-CM | POA: Diagnosis not present

## 2020-12-04 DIAGNOSIS — K5903 Drug induced constipation: Secondary | ICD-10-CM | POA: Diagnosis not present

## 2020-12-04 DIAGNOSIS — M48061 Spinal stenosis, lumbar region without neurogenic claudication: Secondary | ICD-10-CM | POA: Diagnosis not present

## 2020-12-04 DIAGNOSIS — M519 Unspecified thoracic, thoracolumbar and lumbosacral intervertebral disc disorder: Secondary | ICD-10-CM | POA: Diagnosis not present

## 2020-12-04 DIAGNOSIS — M5416 Radiculopathy, lumbar region: Secondary | ICD-10-CM | POA: Diagnosis not present

## 2020-12-04 DIAGNOSIS — M19011 Primary osteoarthritis, right shoulder: Secondary | ICD-10-CM | POA: Diagnosis not present

## 2020-12-04 DIAGNOSIS — Z79899 Other long term (current) drug therapy: Secondary | ICD-10-CM | POA: Diagnosis not present

## 2020-12-07 DIAGNOSIS — F331 Major depressive disorder, recurrent, moderate: Secondary | ICD-10-CM | POA: Diagnosis not present

## 2020-12-07 DIAGNOSIS — F411 Generalized anxiety disorder: Secondary | ICD-10-CM | POA: Diagnosis not present

## 2020-12-25 DIAGNOSIS — R339 Retention of urine, unspecified: Secondary | ICD-10-CM | POA: Diagnosis not present

## 2020-12-26 DIAGNOSIS — M25511 Pain in right shoulder: Secondary | ICD-10-CM | POA: Diagnosis not present

## 2020-12-27 ENCOUNTER — Other Ambulatory Visit: Payer: Self-pay

## 2020-12-27 ENCOUNTER — Encounter: Payer: Self-pay | Admitting: Internal Medicine

## 2020-12-27 ENCOUNTER — Inpatient Hospital Stay: Payer: PPO | Attending: Internal Medicine

## 2020-12-27 ENCOUNTER — Inpatient Hospital Stay (HOSPITAL_BASED_OUTPATIENT_CLINIC_OR_DEPARTMENT_OTHER): Payer: PPO | Admitting: Internal Medicine

## 2020-12-27 ENCOUNTER — Inpatient Hospital Stay: Payer: PPO

## 2020-12-27 DIAGNOSIS — M2550 Pain in unspecified joint: Secondary | ICD-10-CM | POA: Insufficient documentation

## 2020-12-27 DIAGNOSIS — Z8249 Family history of ischemic heart disease and other diseases of the circulatory system: Secondary | ICD-10-CM | POA: Insufficient documentation

## 2020-12-27 DIAGNOSIS — Z8719 Personal history of other diseases of the digestive system: Secondary | ICD-10-CM | POA: Insufficient documentation

## 2020-12-27 DIAGNOSIS — D500 Iron deficiency anemia secondary to blood loss (chronic): Secondary | ICD-10-CM

## 2020-12-27 DIAGNOSIS — Z79899 Other long term (current) drug therapy: Secondary | ICD-10-CM | POA: Insufficient documentation

## 2020-12-27 DIAGNOSIS — Z9049 Acquired absence of other specified parts of digestive tract: Secondary | ICD-10-CM | POA: Insufficient documentation

## 2020-12-27 DIAGNOSIS — Z881 Allergy status to other antibiotic agents status: Secondary | ICD-10-CM | POA: Diagnosis not present

## 2020-12-27 DIAGNOSIS — E785 Hyperlipidemia, unspecified: Secondary | ICD-10-CM | POA: Insufficient documentation

## 2020-12-27 DIAGNOSIS — Z882 Allergy status to sulfonamides status: Secondary | ICD-10-CM | POA: Insufficient documentation

## 2020-12-27 DIAGNOSIS — D649 Anemia, unspecified: Secondary | ICD-10-CM | POA: Insufficient documentation

## 2020-12-27 DIAGNOSIS — M549 Dorsalgia, unspecified: Secondary | ICD-10-CM | POA: Insufficient documentation

## 2020-12-27 DIAGNOSIS — Z823 Family history of stroke: Secondary | ICD-10-CM | POA: Diagnosis not present

## 2020-12-27 DIAGNOSIS — I272 Pulmonary hypertension, unspecified: Secondary | ICD-10-CM | POA: Diagnosis not present

## 2020-12-27 DIAGNOSIS — E039 Hypothyroidism, unspecified: Secondary | ICD-10-CM | POA: Insufficient documentation

## 2020-12-27 DIAGNOSIS — Z888 Allergy status to other drugs, medicaments and biological substances status: Secondary | ICD-10-CM | POA: Insufficient documentation

## 2020-12-27 DIAGNOSIS — R5383 Other fatigue: Secondary | ICD-10-CM | POA: Diagnosis not present

## 2020-12-27 DIAGNOSIS — N3010 Interstitial cystitis (chronic) without hematuria: Secondary | ICD-10-CM | POA: Diagnosis not present

## 2020-12-27 DIAGNOSIS — I509 Heart failure, unspecified: Secondary | ICD-10-CM | POA: Insufficient documentation

## 2020-12-27 LAB — CBC WITH DIFFERENTIAL/PLATELET
Abs Immature Granulocytes: 0.04 10*3/uL (ref 0.00–0.07)
Basophils Absolute: 0 10*3/uL (ref 0.0–0.1)
Basophils Relative: 0 %
Eosinophils Absolute: 0 10*3/uL (ref 0.0–0.5)
Eosinophils Relative: 0 %
HCT: 29.4 % — ABNORMAL LOW (ref 36.0–46.0)
Hemoglobin: 9.7 g/dL — ABNORMAL LOW (ref 12.0–15.0)
Immature Granulocytes: 1 %
Lymphocytes Relative: 13 %
Lymphs Abs: 0.8 10*3/uL (ref 0.7–4.0)
MCH: 33.1 pg (ref 26.0–34.0)
MCHC: 33 g/dL (ref 30.0–36.0)
MCV: 100.3 fL — ABNORMAL HIGH (ref 80.0–100.0)
Monocytes Absolute: 0.3 10*3/uL (ref 0.1–1.0)
Monocytes Relative: 4 %
Neutro Abs: 5.4 10*3/uL (ref 1.7–7.7)
Neutrophils Relative %: 82 %
Platelets: 145 10*3/uL — ABNORMAL LOW (ref 150–400)
RBC: 2.93 MIL/uL — ABNORMAL LOW (ref 3.87–5.11)
RDW: 13.6 % (ref 11.5–15.5)
WBC: 6.6 10*3/uL (ref 4.0–10.5)
nRBC: 0 % (ref 0.0–0.2)

## 2020-12-27 LAB — BASIC METABOLIC PANEL
Anion gap: 14 (ref 5–15)
BUN: 25 mg/dL — ABNORMAL HIGH (ref 8–23)
CO2: 24 mmol/L (ref 22–32)
Calcium: 9.6 mg/dL (ref 8.9–10.3)
Chloride: 100 mmol/L (ref 98–111)
Creatinine, Ser: 1.03 mg/dL — ABNORMAL HIGH (ref 0.44–1.00)
GFR, Estimated: 53 mL/min — ABNORMAL LOW (ref >60)
Glucose, Bld: 177 mg/dL — ABNORMAL HIGH (ref 70–99)
Potassium: 3.8 mmol/L (ref 3.5–5.1)
Sodium: 138 mmol/L (ref 135–145)

## 2020-12-27 LAB — IRON AND TIBC
Iron: 77 ug/dL (ref 28–170)
Saturation Ratios: 25 % (ref 10.4–31.8)
TIBC: 309 ug/dL (ref 250–450)
UIBC: 232 ug/dL

## 2020-12-27 LAB — FERRITIN: Ferritin: 42 ng/mL (ref 11–307)

## 2020-12-27 MED ORDER — SODIUM CHLORIDE 0.9 % IV SOLN: 200.0000 mg | Freq: Once | INTRAVENOUS | Status: DC

## 2020-12-27 MED ORDER — SODIUM CHLORIDE 0.9 % IV SOLN
Freq: Once | INTRAVENOUS | Status: AC
  Filled 2020-12-27: qty 250

## 2020-12-27 MED ORDER — IRON SUCROSE 20 MG/ML IV SOLN
200.0000 mg | Freq: Once | INTRAVENOUS | Status: AC
  Administered 2020-12-27: 200 mg via INTRAVENOUS
  Filled 2020-12-27: qty 10

## 2020-12-27 NOTE — Assessment & Plan Note (Addendum)
#  Chronic anemia-unclear etiology-   Mild symptoms of ongoing fatigue.  Iron studies-August 2021-saturation 16% ferritin-27. Today-emoglobin today is 10.5.  Iron studies pending.   # ETIOLOGY anemia unclear- IDA/CKD vs MDS; if not improving consider bone marrow biopsy.    # DISPOSITION:  # venofer today # follow up in 3 months- MD; labs-cbc/bmp;possible venofer

## 2020-12-27 NOTE — Progress Notes (Signed)
[facility] NOTE  Patient Care Team: Einar Pheasant, [name], [name], [name], [name], MD as Consulting Physician (Hematology and Oncology)  CHIEF COMPLAINTS/PURPOSE OF CONSULTATION: Anemia  # CHRONIC MILD ANEMIA hb ~11.[Previous Dr.Gittin pt]; [facility] 2017- work up [place] [employer]; stool card; Iron/ monoclonal work up- NEGATIVE]   # CHRONIC INTERSTITIAL CYSTITIS/ wheel chair bound  Oncology History   No history exists.     HISTORY OF PRESENTING ILLNESS: pt is a poor historian.  Kimberly Roy 87 y.o.  female  with a history of chronic anemia- is here for a follow up.   Patient complains of mild to moderate fatigue.  Denies any blood in stools or black-colored stools.  No nausea no vomiting.  No abdominal pain.  No easy bleeding/bruising noted.   Review of Systems  Constitutional: Positive for malaise/fatigue. Negative for chills, diaphoresis and fever.  HENT: Negative for nosebleeds and sore throat.   Eyes: Negative for double vision.  Respiratory: Negative for cough, hemoptysis, sputum production, shortness of breath and wheezing.   Cardiovascular: Negative for chest pain, palpitations, orthopnea and leg swelling.  Gastrointestinal: Negative for abdominal pain, blood in stool, constipation, diarrhea, heartburn, melena, nausea and vomiting.  Genitourinary: Negative for dysuria, frequency and urgency.  Musculoskeletal: Positive for back pain and joint pain.  Skin: Negative.  Negative for itching and rash.  Neurological: Negative for dizziness, tingling, focal weakness, weakness and headaches.  Endo/Heme/Allergies: Does not bruise/bleed easily.  Psychiatric/Behavioral: Negative for depression. The patient is not nervous/anxious and does not have insomnia.      MEDICAL HISTORY:  Past Medical History:  Diagnosis  Date  . Anemia   . Anxiety   . Chest pain   . CHF (congestive heart failure) ([place])   . Constipation   . DDD (degenerative disc disease), cervical   . Depression   . DVT (deep venous thrombosis) ([place])   . Dysphonia   . Dyspnea   . Fatty liver   . Fatty liver   . Headache   . Hyperlipidemia   . Hyperpiesia   . Hypertension   . Hypothyroidism   . Interstitial cystitis   . Left ventricular dysfunction   . Lymphedema   . Nephrolithiasis   . Obstructive sleep apnea   . Osteoarthritis    knees/cervical and lumbar spine  . Pulmonary hypertension ([place])   . Pulmonary nodules    followed by Dr Raul Del  . Pure hypercholesterolemia   . Renal cyst    right    SURGICAL HISTORY: Past Surgical History:  Procedure Laterality Date  . ABDOMINAL HYSTERECTOMY     ovaries left in place  . APPENDECTOMY    . Back Surgeries    . BACK SURGERY    . BREAST REDUCTION SURGERY     3/99  . CARDIAC CATHETERIZATION    . cataracts Bilateral   . CERVICAL SPINE SURGERY    . ESOPHAGEAL MANOMETRY N/A 08/02/2015   Procedure: ESOPHAGEAL MANOMETRY (EM);  Surgeon: Josefine Class, MD;  Location: [facility] ENDOSCOPY;  Service: Endoscopy;  Laterality: N/A;  . ESOPHAGOGASTRODUODENOSCOPY N/A 02/27/2015   Procedure: ESOPHAGOGASTRODUODENOSCOPY (EGD);  Surgeon: Hulen Luster, MD;  Location: [facility] ENDOSCOPY;  Service: Gastroenterology;  Laterality: N/A;  . ESOPHAGOGASTRODUODENOSCOPY (EGD) WITH PROPOFOL N/A 10/30/2018   Procedure: ESOPHAGOGASTRODUODENOSCOPY (EGD) WITH PROPOFOL;  Surgeon: Lollie Sails, MD;  Location: [facility] ENDOSCOPY;  Service: Endoscopy;  Laterality:  N/A;  . EXCISIONAL HEMORRHOIDECTOMY    . EYE SURGERY    . FRACTURE SURGERY    . HEMORRHOID SURGERY    . HIP SURGERY  2013   Right hip surgery  . JOINT REPLACEMENT    . KNEE ARTHROSCOPY     left and right  . ORIF FEMUR FRACTURE Right 08/07/2018   Procedure: OPEN REDUCTION INTERNAL FIXATION (ORIF) DISTAL FEMUR FRACTURE;  Surgeon: Hessie Knows, MD;   Location: [facility] ORS;  Service: Orthopedics;  Laterality: Right;  . REDUCTION MAMMAPLASTY Bilateral YRS AGO  . REPLACEMENT TOTAL KNEE Bilateral   . rotator cuff surgery     blilateral  . TONSILECTOMY/ADENOIDECTOMY WITH MYRINGOTOMY    . VISCERAL ARTERY INTERVENTION N/A 02/25/2019   Procedure: VISCERAL ARTERY INTERVENTION;  Surgeon: Algernon Huxley, MD;  Location: [facility];  Service: Cardiovascular;  Laterality: N/A;    SOCIAL HISTORY: Social History   Socioeconomic History  . Marital status: Widowed    Spouse name: Not on file  . Number of children: 2  . Years of education: Not on file  . Highest education level: Not on file  Occupational History    Comment: insurance agency  Tobacco Use  . Smoking status: Never Smoker  . Smokeless tobacco: Never Used  Vaping Use  . Vaping Use: Never used  Substance and Sexual Activity  . Alcohol use: No    Alcohol/week: 0.0 standard drinks  . Drug use: Never  . Sexual activity: Not Currently  Other Topics Concern  . Not on file  Social History Narrative  . Not on file   Social Determinants of Health   Financial Resource Strain: Low Risk   . Difficulty of Paying Living Expenses: Not hard at all  Food Insecurity: No Food Insecurity  . Worried About Charity fundraiser in the Last Year: Never true  . Ran Out of Food in the Last Year: Never true  Transportation Needs: No Transportation Needs  . Lack of Transportation (Medical): No  . Lack of Transportation (Non-Medical): No  Physical Activity: Not on file  Stress: No Stress Concern Present  . Feeling of Stress : Not at all  Social Connections: Unknown  . Frequency of Communication with Friends and Family: Not on file  . Frequency of Social Gatherings with Friends and Family: Twice a week  . Attends Religious Services: Not on file  . Active Member of Clubs or Organizations: Not on file  . Attends Archivist Meetings: Not on file  . Marital Status: Widowed  Intimate  Partner Violence: Not At Risk  . Fear of Current or Ex-Partner: No  . Emotionally Abused: No  . Physically Abused: No  . Sexually Abused: No    FAMILY HISTORY: Family History  Problem Relation Age of Onset  . Heart disease Mother   . Stroke Mother   . Hypertension Mother   . Heart disease Father        myocardial infarction age 67  . Breast cancer Neg Hx     ALLERGIES:  is allergic to lyrica [pregabalin], omnicef [cefdinir], atarax [hydroxyzine], dicyclomine, hydroxyzine hcl, levaquin [levofloxacin], nitrofurantoin, nucynta er [tapentadol hcl er], oxybutynin, zoloft [sertraline hcl], biaxin [clarithromycin], sertraline, sulfa antibiotics, sulfasalazine, tape, and tapentadol.  MEDICATIONS:  Current Outpatient Medications  Medication Sig Dispense Refill  . albuterol (VENTOLIN HFA) 108 (90 Base) MCG/ACT inhaler INHALE 2 PUFFS INTO THE LUNGS EVERY 6 (SIX) HOURS AS NEEDED FOR WHEEZING OR SHORTNESS OF BREATH 18 g 0  .  aluminum-magnesium hydroxide-simethicone (MAALOX) 411-464-31 MG/5ML SUSP Take 15 mLs by mouth 4 (four) times daily -  before meals and at bedtime.     Marland Kitchen amLODipine (NORVASC) 10 MG tablet TAKE 1 TABLET BY MOUTH DAILY 90 tablet 1  . aspirin 81 MG EC tablet Take by mouth.    Marland Kitchen atorvastatin (LIPITOR) 20 MG tablet Take 1 tablet (20 mg total) by mouth at bedtime. 90 tablet 1  . Azelastine HCl 137 MCG/SPRAY SOLN PLACE 1 SPRAY INTO BOTH NOSTRILS TWO TIMES DAILY 30 mL 1  . benazepril (LOTENSIN) 40 MG tablet TAKE 1 TABLET BY MOUTH DAILY 90 tablet 1  . budesonide-formoterol (SYMBICORT) 80-4.5 MCG/ACT inhaler Inhale 2 puffs into the lungs 2 (two) times daily. 1 each 12  . busPIRone (BUSPAR) 10 MG tablet Take 10 mg by mouth 2 (two) times daily.     . calcium citrate-vitamin D (CITRACAL+D) 315-200 MG-UNIT tablet Take 1 tablet by mouth daily.    . cephALEXin (KEFLEX) 250 MG capsule Take 250 mg by mouth daily.    . Cholecalciferol (VITAMIN D3) 1000 UNITS CAPS Take 1 capsule by mouth daily.     . DULoxetine (CYMBALTA) 60 MG capsule TAKE 1 CAPSULE BY MOUTH EVERY DAY. 30 capsule 3  . fluocinonide (LIDEX) 0.05 % external solution Apply topically.    . furosemide (LASIX) 20 MG tablet TAKE 1 TABLET BY MOUTH EVERY DAY AS NEEDED 90 tablet 0  . gabapentin (NEURONTIN) 300 MG capsule TAKE 2 CAPSULES BY MOUTH 3 TIMES A DAY. 540 capsule 2  . hydrochlorothiazide (MICROZIDE) 12.5 MG capsule Take 12.5 mg by mouth daily.    Marland Kitchen HYDROcodone-acetaminophen (NORCO) 10-325 MG tablet Take 1 tablet by mouth 3 (three) times daily. 10 tablet 0  . magnesium oxide (MAG-OX) 400 MG tablet Take 1 tablet (400 mg total) by mouth daily. 30 tablet 1  . metoprolol succinate (TOPROL-XL) 100 MG 24 hr tablet TAKE 1 TABLET BY MOUTH TWICE A DAY WITH OR IMMEDIATELY FOLLOWING A MEAL. (Patient taking differently: Take 50 mg by mouth in the morning and at bedtime.) 180 tablet 1  . mupirocin ointment (BACTROBAN) 2 % Apply 1 application topically 2 (two) times daily. 22 g 0  . polyethylene glycol powder (GLYCOLAX/MIRALAX) 17 GM/SCOOP powder MIX 17 GRAMS AS MARKED ON BOTTLE TOP IN 8 OUNCES OF WATER AND DRINK ONCE A DAY AS DIRECTED. 527 g 0  . RABEprazole (ACIPHEX) 20 MG tablet TAKE 1 TABLET BY MOUTH TWICE A DAY BEFORE A MEAL. 180 tablet 1  . senna (SENOKOT) 8.6 MG TABS tablet Take 1 tablet by mouth 2 (two) times daily.    . Simethicone (GAS-X PO) Take 2 tablets by mouth 2 (two) times daily.    . sodium chloride (OCEAN) 0.65 % nasal spray Place 1 spray into the nose as needed.    Marland Kitchen SYNTHROID 100 MCG tablet TAKE 1 TABLET BY MOUTH DAILY 90 tablet 3  . traZODone (DESYREL) 50 MG tablet Take 25-50 mg by mouth at bedtime as needed.    . fluticasone (FLONASE) 50 MCG/ACT nasal spray Place 2 sprays into both nostrils daily. (Patient not taking: Reported on 12/27/2020) 16 g 1  . ketoconazole (NIZORAL) 2 % shampoo Apply 1 application topically 2 (two) times a week.    . morphine (MS CONTIN) 60 MG 12 hr tablet Take 1 tablet (60 mg total) by mouth  2 (two) times daily. 10 tablet 0  . Naldemedine Tosylate 0.2 MG TABS Take 0.2 mg by mouth daily.  No current facility-administered medications for this visit.      Marland Kitchen  PHYSICAL EXAMINATION: ECOG PERFORMANCE STATUS: 0 - Asymptomatic  Vitals:   12/27/20 1305  BP: (!) 130/47  Pulse: 66  Resp: 20  Temp: 99 F (37.2 C)   Filed Weights   12/27/20 1305  Weight: 183 lb (83 kg)    Physical Exam Constitutional:      Comments: Patient in wheelchair because of chronic arthritic problems.  HENT:     Head: Normocephalic and atraumatic.     Mouth/Throat:     Pharynx: No oropharyngeal exudate.  Eyes:     Pupils: Pupils are equal, round, and reactive to light.  Cardiovascular:     Rate and Rhythm: Normal rate and regular rhythm.  Pulmonary:     Effort: Pulmonary effort is normal. No respiratory distress.     Breath sounds: Normal breath sounds. No wheezing.  Abdominal:     General: Bowel sounds are normal. There is no distension.     Palpations: Abdomen is soft. There is no mass.     Tenderness: There is no abdominal tenderness. There is no guarding or rebound.  Musculoskeletal:        General: No tenderness. Normal range of motion.     Cervical back: Normal range of motion and neck supple.  Skin:    General: Skin is warm.  Neurological:     Mental Status: She is alert and oriented to person, place, and time.  Psychiatric:        Mood and Affect: Affect normal.     LABORATORY DATA:  I have reviewed the data as listed Lab Results  Component Value Date   WBC 6.6 12/27/2020   HGB 9.7 (L) 12/27/2020   HCT 29.4 (L) 12/27/2020   MCV 100.3 (H) 12/27/2020   PLT 145 (L) 12/27/2020   Recent Labs    06/23/20 1123 08/11/20 1226 10/17/20 1253 10/31/20 0949 12/27/20 1256  NA 140 139 140 140 138  K 4.9 4.1 4.7 4.8 3.8  CL 103 100 101 101 100  CO2 31 31 34* 34* 24  GLUCOSE 98 83 95 87 177*  BUN _0 24* 25*  CREATININE 0.99 0.99 0.92 1.00 1.03*  CALCIUM 8.9 9.3  10.0 10.0 9.6  GFRNONAA 55*  --   --   --  53*  PROT 6.2* 6.3 6.5 6.2  --   ALBUMIN 3.7 3.9 3.9 3.9  --   AST _1 --   ALT _2 --   ALKPHOS 90 84 91 83  --   BILITOT 0.5 0.5 0.5 0.5  --   BILIDIR  --  0.1 0.1 0.1  --     RADIOGRAPHIC STUDIES: I have personally reviewed the radiological images as listed and agreed with the findings in the report. No results found.  ASSESSMENT & PLAN:   Anemia due to blood loss, chronic #  Chronic anemia-unclear etiology-   Mild symptoms of ongoing fatigue.  Iron studies-August 2021-saturation 16% ferritin-27. Today-emoglobin today is 10.5.  Iron studies pending.   # ETIOLOGY anemia unclear- IDA/CKD vs MDS; if not improving consider bone marrow biopsy.    # DISPOSITION:  # venofer today # follow up in 3 months- MD; labs-cbc/bmp;possible venofer       Cammie Sickle, MD 12/27/2020 1:29 PM

## 2020-12-27 NOTE — Progress Notes (Signed)
Pt tolerated venofer infusion well with no problems or complaints.  Pt left infusion suite stable in a wheelchair.

## 2021-01-22 ENCOUNTER — Telehealth: Payer: Self-pay | Admitting: Internal Medicine

## 2021-01-22 DIAGNOSIS — R339 Retention of urine, unspecified: Secondary | ICD-10-CM | POA: Diagnosis not present

## 2021-01-22 NOTE — Telephone Encounter (Signed)
Patient would like a referral to have PT on her neck.

## 2021-01-22 NOTE — Telephone Encounter (Signed)
Can we do this without a visit.  I thought new home health orders have to have visit.  If so, ok to schedule virtual visit.

## 2021-01-22 NOTE — Telephone Encounter (Signed)
Patient is wanting to have physical therapy for her neck and right shoulder. No falls or injury. She is having more difficulty with mobility over time and feels like physical therapy may help her. Are you ok to order this without a visit? Would like to do home health physical therapy.

## 2021-01-23 ENCOUNTER — Other Ambulatory Visit: Payer: Self-pay | Admitting: Internal Medicine

## 2021-01-23 ENCOUNTER — Telehealth: Payer: Self-pay | Admitting: Internal Medicine

## 2021-01-23 ENCOUNTER — Telehealth: Payer: Self-pay

## 2021-01-23 DIAGNOSIS — M810 Age-related osteoporosis without current pathological fracture: Secondary | ICD-10-CM

## 2021-01-23 DIAGNOSIS — Z1231 Encounter for screening mammogram for malignant neoplasm of breast: Secondary | ICD-10-CM

## 2021-01-23 NOTE — Telephone Encounter (Addendum)
Acid reflux on saturday now she is having some wheezing in her chest

## 2021-01-23 NOTE — Telephone Encounter (Signed)
Bone density ordered and scheduled

## 2021-01-23 NOTE — Telephone Encounter (Signed)
Pt is having a mammogram on 02/27/21 . She would also like to have a bone density test at the same time. Please place order if able

## 2021-01-24 ENCOUNTER — Other Ambulatory Visit
Admission: RE | Admit: 2021-01-24 | Discharge: 2021-01-24 | Disposition: A | Discharge status: Home / Self Care | Payer: PPO | Source: Home / Self Care | Attending: Adult Health | Admitting: Adult Health

## 2021-01-24 ENCOUNTER — Encounter: Payer: Self-pay | Admitting: Adult Health

## 2021-01-24 ENCOUNTER — Telehealth (INDEPENDENT_AMBULATORY_CARE_PROVIDER_SITE_OTHER): Payer: PPO | Admitting: Adult Health

## 2021-01-24 ENCOUNTER — Ambulatory Visit
Admission: RE | Admit: 2021-01-24 | Discharge: 2021-01-24 | Disposition: A | Discharge status: Home / Self Care | Payer: PPO | Attending: Adult Health | Admitting: Adult Health

## 2021-01-24 ENCOUNTER — Ambulatory Visit
Admission: RE | Admit: 2021-01-24 | Discharge: 2021-01-24 | Disposition: A | Discharge status: Home / Self Care | Payer: PPO | Source: Ambulatory Visit | Attending: Adult Health | Admitting: Adult Health

## 2021-01-24 VITALS — Ht 62.01 in | Wt 182.0 lb

## 2021-01-24 DIAGNOSIS — R062 Wheezing: Secondary | ICD-10-CM | POA: Insufficient documentation

## 2021-01-24 DIAGNOSIS — R21 Rash and other nonspecific skin eruption: Secondary | ICD-10-CM | POA: Diagnosis not present

## 2021-01-24 DIAGNOSIS — R5383 Other fatigue: Secondary | ICD-10-CM

## 2021-01-24 DIAGNOSIS — I517 Cardiomegaly: Secondary | ICD-10-CM | POA: Diagnosis not present

## 2021-01-24 DIAGNOSIS — R059 Cough, unspecified: Secondary | ICD-10-CM

## 2021-01-24 DIAGNOSIS — J189 Pneumonia, unspecified organism: Secondary | ICD-10-CM | POA: Diagnosis not present

## 2021-01-24 LAB — CBC WITH DIFFERENTIAL/PLATELET
Abs Immature Granulocytes: 0.01 10*3/uL (ref 0.00–0.07)
Basophils Absolute: 0 10*3/uL (ref 0.0–0.1)
Basophils Relative: 0 %
Eosinophils Absolute: 0.3 10*3/uL (ref 0.0–0.5)
Eosinophils Relative: 3 %
HCT: 33.2 % — ABNORMAL LOW (ref 36.0–46.0)
Hemoglobin: 10.8 g/dL — ABNORMAL LOW (ref 12.0–15.0)
Immature Granulocytes: 0 %
Lymphocytes Relative: 31 %
Lymphs Abs: 2.3 10*3/uL (ref 0.7–4.0)
MCH: 32.7 pg (ref 26.0–34.0)
MCHC: 32.5 g/dL (ref 30.0–36.0)
MCV: 100.6 fL — ABNORMAL HIGH (ref 80.0–100.0)
Monocytes Absolute: 0.6 10*3/uL (ref 0.1–1.0)
Monocytes Relative: 8 %
Neutro Abs: 4.3 10*3/uL (ref 1.7–7.7)
Neutrophils Relative %: 58 %
Platelets: 177 10*3/uL (ref 150–400)
RBC: 3.3 MIL/uL — ABNORMAL LOW (ref 3.87–5.11)
RDW: 13.5 % (ref 11.5–15.5)
WBC: 7.4 10*3/uL (ref 4.0–10.5)
nRBC: 0 % (ref 0.0–0.2)

## 2021-01-24 LAB — URINALYSIS, ROUTINE W REFLEX MICROSCOPIC
Bilirubin Urine: NEGATIVE
Glucose, UA: NEGATIVE mg/dL
Hgb urine dipstick: NEGATIVE
Ketones, ur: NEGATIVE mg/dL
Leukocytes,Ua: NEGATIVE
Nitrite: NEGATIVE
Protein, ur: NEGATIVE mg/dL
Specific Gravity, Urine: 1.009 (ref 1.005–1.030)
pH: 7 (ref 5.0–8.0)

## 2021-01-24 LAB — COMPREHENSIVE METABOLIC PANEL
ALT: 13 U/L (ref 0–44)
AST: 20 U/L (ref 15–41)
Albumin: 4 g/dL (ref 3.5–5.0)
Alkaline Phosphatase: 86 U/L (ref 38–126)
Anion gap: 10 (ref 5–15)
BUN: 18 mg/dL (ref 8–23)
CO2: 31 mmol/L (ref 22–32)
Calcium: 9.9 mg/dL (ref 8.9–10.3)
Chloride: 98 mmol/L (ref 98–111)
Creatinine, Ser: 0.84 mg/dL (ref 0.44–1.00)
GFR, Estimated: >60 mL/min (ref >60)
Glucose, Bld: 96 mg/dL (ref 70–99)
Potassium: 4.2 mmol/L (ref 3.5–5.1)
Sodium: 139 mmol/L (ref 135–145)
Total Bilirubin: 0.6 mg/dL (ref 0.3–1.2)
Total Protein: 6.8 g/dL (ref 6.5–8.1)

## 2021-01-24 LAB — BRAIN NATRIURETIC PEPTIDE: B Natriuretic Peptide: 309.6 pg/mL — ABNORMAL HIGH (ref 0.0–100.0)

## 2021-01-24 NOTE — Telephone Encounter (Signed)
Noted and seen. Testing ordered.

## 2021-01-24 NOTE — Progress Notes (Signed)
Virtual Visit via Video Note  I connected with [name] on 01/24/21 at  9:30 AM EDT by a video enabled telemedicine application and verified that I am speaking with the correct person using two identifiers.  Parties involved in visit as below:   Location: Patient: at home  Provider: Provider: Provider's office at  [facility], [place].     I discussed the limitations of evaluation and management by telemedicine and the availability of in person appointments. The patient expressed understanding and agreed to proceed.  History of Present Illness: Patient reports on 01/21/21 Sunday morning, she leaned over to put her hose on and had acid reflux come out of her mouth and she felt like she might have aspirated slightly but had no symptoms. Denies nay choking. Denies any problems swallowing.   She then started with some mild wheezing yesterday 01/23/21, she had a productive cough tinged with yellow. She has cough this morning.  Denies any shortness of breath.  2 L of oxygen at night is her normal.  She reports acid is improved.  She has 97 temporal.   Pulse oximetry 95%  Heart rate 58 normal for her. She just does not feel well today.  Denies any chest pain, chest tenderness or tightness.  She reports  no reflux today orf vomiting.   Denies any abdominal pain or shortness of breath. Her daughter is on video visit.  Patient  denies any fever, body aches,chills, rash, chest pain, shortness of breath, nausea, vomiting, or diarrhea.  Denies dizziness, lightheadedness, pre syncopal or syncopal episodes.    Observations/Objective:   Patient is alert and oriented and responsive to questions Engages in conversation with provider. Speaks in full sentences without any pauses without any shortness of breath or distress.  Bronchial type cough heard on video visit once.   Assessment and Plan:  Cough - Plan: DG Chest 2 View, CBC with Differential/Platelet,  Comprehensive metabolic panel, B Nat Peptide  Fatigue, unspecified type - Plan: Urine Culture, Urinalysis, Routine w reflex microscopic, CANCELED: Urinalysis, Routine w reflex microscopic, CANCELED: Urine Culture  Will do covid at home test today.  Offer covid/ influenza here in office declined for now.  Will go for testing at [facility] as ordered.   Orders Placed This Encounter  Procedures  . Urine Culture  . DG Chest 2 View  . CBC with Differential/Platelet  . Comprehensive metabolic panel  . B Nat Peptide  . Urinalysis, Routine w reflex microscopic    Follow Up Instructions:   Advised in person evaluation at anytime is advised if any symptoms do not improve, worsen or change at any given time.  Red Flags discussed. The patient was given clear instructions to go to ER or return to medical center if any red flags develop, symptoms do not improve, worsen or new problems develop. They verbalized understanding.   I discussed the assessment and treatment plan with the patient. The patient was provided an opportunity to ask questions and all were answered. The patient agreed with the plan and demonstrated an understanding of the instructions.   The patient was advised to call back or seek an in-person evaluation if the symptoms worsen or if the condition fails to improve as anticipated.  I provided minutes of non-face-to-face time during this encounter.   Marcille Buffy, FNP

## 2021-01-24 NOTE — Telephone Encounter (Signed)
Juliann Pulse- this patient saw Dr Nicki Reaper in March and then did an acute video visit with NP today. Can we use Dr Lars Mage last OV note for [facility] PT referral or do I need to work her in somewhere in order to get this set up?

## 2021-01-24 NOTE — Telephone Encounter (Signed)
Left message for patient to return call back.

## 2021-01-24 NOTE — Telephone Encounter (Signed)
Called and spoke to [place]. Lamaya states she started to experience an "extreme bout" of Acid Reflux. She states that she was experiencing burning in her throat and believes that she had inhaled some as her "lungs felt full". She states that she started to wheeze and cough up phlegm on Tuesday and today she feels better. Katharin believes that her symptoms were due to her Acid Reflux and that she had coughed everything out on Tuesday. Reatha declines being in any distress today and declines going to the Urgent care to be evaluated. She states that she is having no SOB and is not having any of the coughing and chest burning that she had felt prior. She scheduled for a virtual visit with Sharyn Lull Flinchum at 9:30 on 01/24/21 to be evaluated.

## 2021-01-24 NOTE — Telephone Encounter (Signed)
This will need to be a visit that addresses this particular problem due to Medicare will not cover without it.

## 2021-01-25 NOTE — Progress Notes (Signed)
Chest x ray is within normal limits. Please see if she went for covid testing and what the result was ?  Is she feeling any better today ?If she is not I recommend she be evaluated in person at Urgent Care as discussed on video visit. ( see lab note as well)

## 2021-01-25 NOTE — Progress Notes (Signed)
BNP elevated, any edema ? CMP stable and within normal limits.  CBC is stable, anemia improved.  ( see x ray note as well) verify if she has covid tested ? How is she feeling today ?

## 2021-01-25 NOTE — Telephone Encounter (Signed)
Pt scheduled for virtual visit

## 2021-01-26 LAB — URINE CULTURE: Culture: NO GROWTH

## 2021-01-26 NOTE — Progress Notes (Signed)
Urine is negative

## 2021-01-30 ENCOUNTER — Telehealth (INDEPENDENT_AMBULATORY_CARE_PROVIDER_SITE_OTHER): Payer: PPO | Admitting: Internal Medicine

## 2021-01-30 ENCOUNTER — Other Ambulatory Visit: Payer: Self-pay

## 2021-01-30 DIAGNOSIS — I10 Essential (primary) hypertension: Secondary | ICD-10-CM | POA: Diagnosis not present

## 2021-01-30 DIAGNOSIS — I700 Atherosclerosis of aorta: Secondary | ICD-10-CM | POA: Diagnosis not present

## 2021-01-30 DIAGNOSIS — J9611 Chronic respiratory failure with hypoxia: Secondary | ICD-10-CM | POA: Diagnosis not present

## 2021-01-30 DIAGNOSIS — M542 Cervicalgia: Secondary | ICD-10-CM

## 2021-01-30 NOTE — Progress Notes (Signed)
Patient ID: Kimberly Roy, female   DOB: 05-19-1931, 87 y.o.   MRN: [id number]   Virtual Visit via video Note  This visit type was conducted due to national recommendations for restrictions regarding the COVID-19 pandemic (e.g. social distancing).  This format is felt to be most appropriate for this patient at this time.  All issues noted in this document were discussed and addressed.  No physical exam was performed (except for noted visual exam findings with Video Visits).   I connected with [name] by a video enabled telemedicine application and verified that I am speaking with the correct person using two identifiers. Location patient: home Location provider: work  Persons participating in the virtual visit: patient, provider  The limitations, risks, security and privacy concerns of performing an evaluation and management service by video and the availability of in person appointments have been discussed.  It has also been discussed with the patient that there may be a patient responsible charge related to this service. The patient expressed understanding and agreed to proceed.   Reason for visit: work in appt.   HPI: Work in to discuss neck and shoulder pain.  Has had problems for years.  Has to sleep in recliner.  Worsened recently.  Uses a neck pillow.  States has degenerative changes.  Hard to turn right side.  Limited rom.  She uses a walker.  Pain aggravated when pushing down on walker.  Limits some activity as outlined.  No chest pain.  Using oxygen at night.  Breathing at baseline.  Eating.  No nausea or vomiting.     ROS: See pertinent positives and negatives per HPI.  Past Medical History:  Diagnosis Date  . Anemia   . Anxiety   . Chest pain   . CHF (congestive heart failure) ([place])   . Constipation   . DDD (degenerative disc disease), cervical   . Depression   . DVT (deep venous thrombosis) ([place])   . Dysphonia   . Dyspnea   . Fatty liver   . Fatty liver   . Headache    . Hyperlipidemia   . Hyperpiesia   . Hypertension   . Hypothyroidism   . Interstitial cystitis   . Left ventricular dysfunction   . Lymphedema   . Nephrolithiasis   . Obstructive sleep apnea   . Osteoarthritis    knees/cervical and lumbar spine  . Pulmonary hypertension ([place])   . Pulmonary nodules    followed by Dr Raul Del  . Pure hypercholesterolemia   . Renal cyst    right    Past Surgical History:  Procedure Laterality Date  . ABDOMINAL HYSTERECTOMY     ovaries left in place  . APPENDECTOMY    . Back Surgeries    . BACK SURGERY    . BREAST REDUCTION SURGERY     3/99  . CARDIAC CATHETERIZATION    . cataracts Bilateral   . CERVICAL SPINE SURGERY    . ESOPHAGEAL MANOMETRY N/A 08/02/2015   Procedure: ESOPHAGEAL MANOMETRY (EM);  Surgeon: Josefine Class, MD;  Location[facility] ENDOSCOPY;  Service: Endoscopy;  Laterality: N/A;  . ESOPHAGOGASTRODUODENOSCOPY N/A 02/27/2015   Procedure: ESOPHAGOGASTRODUODENOSCOPY (EGD);  Surgeon: Hulen Luster, MD;  Location: [facility] ENDOSCOPY;  Service: Gastroenterology;  Laterality: N/A;  . ESOPHAGOGASTRODUODENOSCOPY (EGD) WITH PROPOFOL N/A 10/30/2018   Procedure: ESOPHAGOGASTRODUODENOSCOPY (EGD) WITH PROPOFOL;  Surgeon: Lollie Sails, MD;  Location: [facility] ENDOSCOPY;  Service: Endoscopy;  Laterality: N/A;  . EXCISIONAL HEMORRHOIDECTOMY    .  EYE SURGERY    . FRACTURE SURGERY    . HEMORRHOID SURGERY    . HIP SURGERY  2013   Right hip surgery  . JOINT REPLACEMENT    . KNEE ARTHROSCOPY     left and right  . ORIF FEMUR FRACTURE Right 08/07/2018   Procedure: OPEN REDUCTION INTERNAL FIXATION (ORIF) DISTAL FEMUR FRACTURE;  Surgeon: Hessie Knows, MD;  Location: [facility] ORS;  Service: Orthopedics;  Laterality: Right;  . REDUCTION MAMMAPLASTY Bilateral YRS AGO  . REPLACEMENT TOTAL KNEE Bilateral   . rotator cuff surgery     blilateral  . TONSILECTOMY/ADENOIDECTOMY WITH MYRINGOTOMY    . VISCERAL ARTERY INTERVENTION N/A 02/25/2019   Procedure: VISCERAL  ARTERY INTERVENTION;  Surgeon: Algernon Huxley, MD;  Location: [facility];  Service: Cardiovascular;  Laterality: N/A;    Family History  Problem Relation Age of Onset  . Heart disease Mother   . Stroke Mother   . Hypertension Mother   . Heart disease Father        myocardial infarction age 37  . Breast cancer Neg Hx     SOCIAL HX: reviewed.    Current Outpatient Medications:  .  albuterol (VENTOLIN HFA) 108 (90 Base) MCG/ACT inhaler, INHALE 2 PUFFS INTO THE LUNGS EVERY 6 (SIX) HOURS AS NEEDED FOR WHEEZING OR SHORTNESS OF BREATH, Disp: 18 g, Rfl: 0 .  aluminum-magnesium hydroxide-simethicone (MAALOX) 833-582-51 MG/5ML SUSP, Take 15 mLs by mouth 4 (four) times daily -  before meals and at bedtime. , Disp: , Rfl:  .  amLODipine (NORVASC) 10 MG tablet, TAKE 1 TABLET BY MOUTH DAILY, Disp: 90 tablet, Rfl: 1 .  aspirin 81 MG EC tablet, Take by mouth., Disp: , Rfl:  .  atorvastatin (LIPITOR) 20 MG tablet, Take 1 tablet (20 mg total) by mouth at bedtime., Disp: 90 tablet, Rfl: 1 .  Azelastine HCl 137 MCG/SPRAY SOLN, PLACE 1 SPRAY INTO BOTH NOSTRILS TWO TIMES DAILY, Disp: 30 mL, Rfl: 1 .  benazepril (LOTENSIN) 40 MG tablet, TAKE 1 TABLET BY MOUTH DAILY, Disp: 90 tablet, Rfl: 1 .  betamethasone dipropionate 0.05 % cream, Apply 1 application topically 2 (two) times daily., Disp: , Rfl:  .  budesonide-formoterol (SYMBICORT) 80-4.5 MCG/ACT inhaler, Inhale 2 puffs into the lungs 2 (two) times daily., Disp: 1 each, Rfl: 12 .  busPIRone (BUSPAR) 10 MG tablet, Take 10 mg by mouth 2 (two) times daily. , Disp: , Rfl:  .  calcium citrate-vitamin D (CITRACAL+D) 315-200 MG-UNIT tablet, Take 1 tablet by mouth daily., Disp: , Rfl:  .  cephALEXin (KEFLEX) 250 MG capsule, Take 250 mg by mouth daily., Disp: , Rfl:  .  Cholecalciferol (VITAMIN D3) 1000 UNITS CAPS, Take 1 capsule by mouth daily., Disp: , Rfl:  .  Diclofenac Sodium 3 % GEL, Apply topically 2 (two) times daily., Disp: , Rfl:  .  Docusate  Sodium (DSS) 100 MG CAPS, Take by mouth., Disp: , Rfl:  .  DULoxetine (CYMBALTA) 60 MG capsule, TAKE 1 CAPSULE BY MOUTH EVERY DAY., Disp: 30 capsule, Rfl: 3 .  fluocinonide (LIDEX) 0.05 % external solution, Apply topically., Disp: , Rfl:  .  fluticasone (FLONASE) 50 MCG/ACT nasal spray, Place 2 sprays into both nostrils daily., Disp: 16 g, Rfl: 1 .  furosemide (LASIX) 20 MG tablet, TAKE 1 TABLET BY MOUTH EVERY DAY AS NEEDED, Disp: 90 tablet, Rfl: 0 .  gabapentin (NEURONTIN) 300 MG capsule, TAKE 2 CAPSULES BY MOUTH 3 TIMES A DAY., Disp: 540 capsule,  Rfl: 2 .  hydrochlorothiazide (MICROZIDE) 12.5 MG capsule, Take 12.5 mg by mouth daily., Disp: , Rfl:  .  HYDROcodone-acetaminophen (NORCO) 10-325 MG tablet, Take 1 tablet by mouth 3 (three) times daily., Disp: 10 tablet, Rfl: 0 .  ketoconazole (NIZORAL) 2 % shampoo, Apply 1 application topically 2 (two) times a week., Disp: , Rfl:  .  magnesium oxide (MAG-OX) 400 MG tablet, Take 1 tablet (400 mg total) by mouth daily., Disp: 30 tablet, Rfl: 1 .  metoprolol succinate (TOPROL-XL) 100 MG 24 hr tablet, TAKE 1 TABLET BY MOUTH TWICE A DAY WITH OR IMMEDIATELY FOLLOWING A MEAL. (Patient taking differently: Take 50 mg by mouth in the morning and at bedtime.), Disp: 180 tablet, Rfl: 1 .  montelukast (SINGULAIR) 10 MG tablet, 10 mg once daily as needed, Disp: , Rfl:  .  morphine (MS CONTIN) 60 MG 12 hr tablet, Take 1 tablet (60 mg total) by mouth 2 (two) times daily., Disp: 10 tablet, Rfl: 0 .  mupirocin ointment (BACTROBAN) 2 %, Apply 1 application topically 2 (two) times daily., Disp: 22 g, Rfl: 0 .  Naldemedine Tosylate 0.2 MG TABS, Take 0.2 mg by mouth daily., Disp: , Rfl:  .  polyethylene glycol powder (GLYCOLAX/MIRALAX) 17 GM/SCOOP powder, MIX 17 GRAMS AS MARKED ON BOTTLE TOP IN 8 OUNCES OF WATER AND DRINK ONCE A DAY AS DIRECTED., Disp: 527 g, Rfl: 0 .  RABEprazole (ACIPHEX) 20 MG tablet, TAKE 1 TABLET BY MOUTH TWICE A DAY BEFORE A MEAL., Disp: 180 tablet,  Rfl: 1 .  senna (SENOKOT) 8.6 MG TABS tablet, Take 1 tablet by mouth 2 (two) times daily., Disp: , Rfl:  .  Simethicone (GAS-X PO), Take 2 tablets by mouth 2 (two) times daily., Disp: , Rfl:  .  sodium chloride (OCEAN) 0.65 % nasal spray, Place 1 spray into the nose as needed., Disp: , Rfl:  .  SYNTHROID 100 MCG tablet, TAKE 1 TABLET BY MOUTH DAILY, Disp: 90 tablet, Rfl: 3 .  traZODone (DESYREL) 50 MG tablet, Take 25-50 mg by mouth at bedtime as needed., Disp: , Rfl:  .  triamcinolone ointment (KENALOG) 0.1 %, SMARTSIG:1 Application Topical 2-3 Times Daily, Disp: , Rfl:   EXAM: GENERAL: alert, oriented, appears well and in no acute distress  HEENT: atraumatic, conjunttiva clear, no obvious abnormalities on inspection of external nose and ears  NECK: normal movements of the head and neck  LUNGS: on inspection no signs of respiratory distress, breathing rate appears normal, no obvious gross SOB, gasping or wheezing  CV: no obvious cyanosis  MS:  Increased pain with rotation of head - limited rom - more when turning to right side.   PSYCH/NEURO: pleasant and cooperative, no obvious depression or anxiety, speech and thought processing grossly intact  ASSESSMENT AND PLAN:  Discussed the following assessment and plan:  Problem List Items Addressed This Visit    Aortic atherosclerosis ([place])    Continue lipitor.       Chronic respiratory failure with hypoxia (HCC)    Breathing at baseline.  Using oxygen at night.       Hypertension    Continue amlodipine, lasix and metoprolol.  Follow pressures.  Follow metabolic panel.       Neck pain    Increased neck and shoulder pain.  Limited rom.  Increased pain using her walker as outlined.  Limited lifting.  Request home health PT.  Referral placed.       Relevant Orders   Ambulatory referral to  Home Health      Return if symptoms worsen or fail to improve.   I discussed the assessment and treatment plan with the patient. The  patient was provided an opportunity to ask questions and all were answered. The patient agreed with the plan and demonstrated an understanding of the instructions.   The patient was advised to call back or seek an in-person evaluation if the symptoms worsen or if the condition fails to improve as anticipated.    Einar Pheasant, MD

## 2021-02-01 ENCOUNTER — Telehealth: Payer: Self-pay

## 2021-02-01 NOTE — Chronic Care Management (AMB) (Signed)
  Chronic Care Management   Note  02/01/2021 Name: RACINE ERBY MRN: [id number] DOB: October 03, 1930  Doristine Section Rakers is a 87 y.o. year old female who is a primary care patient of Einar Pheasant, MD. I reached out to [name] by phone today in response to a referral sent by Ms. Doristine Section Hinostroza's PCP, Einar Pheasant, MD     Ms. Pytel was given information about Chronic Care Management services today including:  1. CCM service includes personalized support from designated clinical staff supervised by her physician, including individualized plan of care and coordination with other care providers 2. 24/7 contact phone numbers for assistance for urgent and routine care needs. 3. Service will only be billed when office clinical staff spend 20 minutes or more in a month to coordinate care. 4. Only one practitioner may furnish and bill the service in a calendar month. 5. The patient may stop CCM services at any time (effective at the end of the month) by phone call to the office staff. 6. The patient will be responsible for cost sharing (co-pay) of up to 20% of the service fee (after annual deductible is met).  Patient agreed to services and verbal consent obtained.   Follow up plan: Telephone appointment with care management team member scheduled for:02/09/2021  Noreene Larsson, [place], [place], [place] [address] Direct Dial: [phone] [email or web address]

## 2021-02-04 ENCOUNTER — Encounter: Payer: Self-pay | Admitting: Internal Medicine

## 2021-02-04 NOTE — Assessment & Plan Note (Signed)
Continue amlodipine, lasix and metoprolol.  Follow pressures.  Follow metabolic panel.

## 2021-02-04 NOTE — Assessment & Plan Note (Signed)
Increased neck and shoulder pain.  Limited rom.  Increased pain using her walker as outlined.  Limited lifting.  Request home health PT.  Referral placed.

## 2021-02-04 NOTE — Assessment & Plan Note (Signed)
Continue lipitor.

## 2021-02-04 NOTE — Assessment & Plan Note (Signed)
Breathing at baseline.  Using oxygen at night.

## 2021-02-06 DIAGNOSIS — F419 Anxiety disorder, unspecified: Secondary | ICD-10-CM | POA: Diagnosis not present

## 2021-02-06 DIAGNOSIS — F5105 Insomnia due to other mental disorder: Secondary | ICD-10-CM | POA: Diagnosis not present

## 2021-02-06 DIAGNOSIS — F330 Major depressive disorder, recurrent, mild: Secondary | ICD-10-CM | POA: Diagnosis not present

## 2021-02-09 ENCOUNTER — Ambulatory Visit (INDEPENDENT_AMBULATORY_CARE_PROVIDER_SITE_OTHER): Payer: PPO | Admitting: *Deleted

## 2021-02-09 DIAGNOSIS — E039 Hypothyroidism, unspecified: Secondary | ICD-10-CM | POA: Diagnosis not present

## 2021-02-09 DIAGNOSIS — I272 Pulmonary hypertension, unspecified: Secondary | ICD-10-CM

## 2021-02-09 DIAGNOSIS — I10 Essential (primary) hypertension: Secondary | ICD-10-CM | POA: Diagnosis not present

## 2021-02-09 DIAGNOSIS — M810 Age-related osteoporosis without current pathological fracture: Secondary | ICD-10-CM | POA: Diagnosis not present

## 2021-02-09 DIAGNOSIS — M542 Cervicalgia: Secondary | ICD-10-CM

## 2021-02-09 DIAGNOSIS — F439 Reaction to severe stress, unspecified: Secondary | ICD-10-CM

## 2021-02-09 NOTE — Chronic Care Management (AMB) (Signed)
Chronic Care Management   CCM RN Visit Note  02/09/2021 Name: Kimberly Roy MRN: [id number] DOB: 10-Dec-1930  Subjective: Kimberly Roy is a 87 y.o. year old female who is a primary care patient of Einar Pheasant, MD. The care management team was consulted for assistance with disease management and care coordination needs.    Engaged with patient by telephone for initial visit in response to provider referral for case management and/or care coordination services.   Consent to Services:  The patient was given the following information about Chronic Care Management services today, agreed to services, and gave verbal consent: 1. CCM service includes personalized support from designated clinical staff supervised by the primary care provider, including individualized plan of care and coordination with other care providers 2. 24/7 contact phone numbers for assistance for urgent and routine care needs. 3. Service will only be billed when office clinical staff spend 20 minutes or more in a month to coordinate care. 4. Only one practitioner may furnish and bill the service in a calendar month. 5.The patient may stop CCM services at any time (effective at the end of the month) by phone call to the office staff. 6. The patient will be responsible for cost sharing (co-pay) of up to 20% of the service fee (after annual deductible is met). Patient agreed to services and consent obtained.  Patient agreed to services and verbal consent obtained.   Assessment: Review of patient past medical history, allergies, medications, health status, including review of consultants reports, laboratory and other test data, was performed as part of comprehensive evaluation and provision of chronic care management services.   SDOH (Social Determinants of Health) assessments and interventions performed:  SDOH Interventions    Flowsheet Row Most Recent Value  SDOH Interventions   Food Insecurity Interventions Intervention Not  Indicated  Financial Strain Interventions Intervention Not Indicated  Housing Interventions Intervention Not Indicated  Intimate Partner Violence Interventions Intervention Not Indicated  Transportation Interventions Intervention Not Indicated  [cousin now provides tranportation]        CCM Care Plan  Allergies  Allergen Reactions   Lyrica [Pregabalin] Swelling   Omnicef [Cefdinir] Diarrhea and Nausea And Vomiting   Atarax [Hydroxyzine]     jittery   Dicyclomine Other (See Comments)    Abdominal bloating    Hydroxyzine Hcl     jittery   Levaquin [Levofloxacin] Swelling   Nitrofurantoin Diarrhea   Nucynta Er [Tapentadol Hcl Er] Other (See Comments)    Severe constipation    Oxybutynin Other (See Comments)    Blurred vision   Zoloft [Sertraline Hcl]     Severe headache   Biaxin [Clarithromycin] Other (See Comments) and Rash    Pt does not remember Pt does not remember   Sertraline Nausea And Vomiting    Severe headache Severe headache Other reaction(s): Headache Severe headache   Sulfa Antibiotics Rash    Pt does not remember   Sulfasalazine Rash    Pt does not remember   Tape Rash    Durabond - redness   Tapentadol Other (See Comments) and Rash    _0     Outpatient Encounter Medications as of 02/09/2021  Medication Sig Note   albuterol (VENTOLIN HFA) 108 (90 Base) MCG/ACT inhaler INHALE 2 PUFFS INTO THE LUNGS EVERY 6 (SIX) HOURS AS NEEDED FOR WHEEZING OR SHORTNESS OF BREATH    aluminum-magnesium hydroxide-simethicone (MAALOX) 200-200-20 MG/5ML SUSP Take 15 mLs by mouth 4 (four)  times daily -  before meals and at bedtime.  02/09/2021: Reports taking as needed   amLODipine (NORVASC) 10 MG tablet TAKE 1 TABLET BY MOUTH DAILY    aspirin 81 MG EC tablet Take by mouth.    atorvastatin (LIPITOR) 20 MG tablet Take 1 tablet (20 mg total) by mouth at bedtime.    Azelastine  HCl 137 MCG/SPRAY SOLN PLACE 1 SPRAY INTO BOTH NOSTRILS TWO TIMES DAILY    benazepril (LOTENSIN) 40 MG tablet TAKE 1 TABLET BY MOUTH DAILY    betamethasone dipropionate 0.05 % cream Apply 1 application topically 2 (two) times daily. 02/09/2021: Reports uses as needed   busPIRone (BUSPAR) 10 MG tablet Take 10 mg by mouth 2 (two) times daily.     calcium citrate-vitamin D (CITRACAL+D) 315-200 MG-UNIT tablet Take 1 tablet by mouth daily.    cephALEXin (KEFLEX) 250 MG capsule Take 250 mg by mouth daily.    Cholecalciferol (VITAMIN D3) 1000 UNITS CAPS Take 1 capsule by mouth daily.    Diclofenac Sodium 3 % GEL Apply topically 2 (two) times daily. 02/09/2021: As needed   Docusate Sodium (DSS) 100 MG CAPS Take by mouth.    DULoxetine (CYMBALTA) 60 MG capsule TAKE 1 CAPSULE BY MOUTH EVERY DAY.    fluocinonide (LIDEX) 0.05 % external solution Apply topically.    furosemide (LASIX) 20 MG tablet TAKE 1 TABLET BY MOUTH EVERY DAY AS NEEDED    gabapentin (NEURONTIN) 300 MG capsule TAKE 2 CAPSULES BY MOUTH 3 TIMES A DAY. 02/09/2021: Reports taking twice a day   HYDROcodone-acetaminophen (NORCO) 10-325 MG tablet Take 1 tablet by mouth 3 (three) times daily.    magnesium oxide (MAG-OX) 400 MG tablet Take 1 tablet (400 mg total) by mouth daily.    Melatonin 10 MG CAPS Take 1 capsule by mouth.    metoprolol succinate (TOPROL-XL) 100 MG 24 hr tablet TAKE 1 TABLET BY MOUTH TWICE A DAY WITH OR IMMEDIATELY FOLLOWING A MEAL. (Patient taking differently: Take 50 mg by mouth in the morning and at bedtime.)    montelukast (SINGULAIR) 10 MG tablet 10 mg once daily as needed    morphine (MS CONTIN) 60 MG 12 hr tablet Take 1 tablet (60 mg total) by mouth 2 (two) times daily.    Naldemedine Tosylate 0.2 MG TABS Take 0.2 mg by mouth daily.    polyethylene glycol powder (GLYCOLAX/MIRALAX) 17 GM/SCOOP powder MIX 17 GRAMS AS MARKED ON BOTTLE TOP IN 8 OUNCES OF WATER AND DRINK ONCE A DAY AS DIRECTED.    RABEprazole (ACIPHEX) 20 MG  tablet TAKE 1 TABLET BY MOUTH TWICE A DAY BEFORE A MEAL.    senna (SENOKOT) 8.6 MG TABS tablet Take 1 tablet by mouth 2 (two) times daily.    Simethicone (GAS-X PO) Take 2 tablets by mouth 2 (two) times daily.    sodium chloride (OCEAN) 0.65 % nasal spray Place 1 spray into the nose as needed.    SYNTHROID 100 MCG tablet TAKE 1 TABLET BY MOUTH DAILY    traZODone (DESYREL) 50 MG tablet Take 25-50 mg by mouth at bedtime as needed.    triamcinolone ointment (KENALOG) 0.1 % SMARTSIG:1 Application Topical 2-3 Times Daily    budesonide-formoterol (SYMBICORT) 80-4.5 MCG/ACT inhaler Inhale 2 puffs into the lungs 2 (two) times daily. (Patient not taking: Reported on 02/09/2021) 02/09/2021: Reports not taking at this time   fluticasone (FLONASE) 50 MCG/ACT nasal spray Place 2 sprays into both nostrils daily. (Patient not taking: Reported on 02/09/2021)  hydrochlorothiazide (MICROZIDE) 12.5 MG capsule Take 12.5 mg by mouth daily. (Patient not taking: Reported on 02/09/2021) 02/09/2021: Reports not taking   ketoconazole (NIZORAL) 2 % shampoo Apply 1 application topically 2 (two) times a week.    mupirocin ointment (BACTROBAN) 2 % Apply 1 application topically 2 (two) times daily.    No facility-administered encounter medications on file as of 02/09/2021.    Patient Active Problem List   Diagnosis Date Noted   Aortic atherosclerosis ([place]) 11/20/2020   Bradycardia 06/08/2020   Itching 04/08/2020   Dyspnea 01/17/2020   Chronic respiratory failure with hypoxia ([facility]) 01/17/2020   Right hip pain 01/10/2020   Leukocytosis 11/15/2019   Wound of buttock 10/25/2019   COVID-19 virus infection 10/04/2019   Urinary frequency 09/26/2019   Cold feeling 09/12/2019   Left knee pain 07/04/2019   Hemoptysis 06/30/2019   Femur fracture ([place]) 08/06/2018   Right shoulder pain 05/16/2017   Chronic venous insufficiency 02/10/2017   Lymphedema 02/10/2017   Neuropathy 12/25/2016   Anemia due to blood loss, chronic  08/07/2016   GERD (gastroesophageal reflux disease) 12/10/2015   Finger pain 11/13/2015   Carotid artery calcification 09/26/2015   External nasal lesion 09/25/2015   Muscle cramps 09/01/2015   Excessive sweating 07/30/2015   Headache 07/16/2015   Groin pain 05/14/2015   Muscle twitching 03/05/2015   Leg cramps 02/11/2015   Back pain 02/11/2015   Abdominal pain 02/11/2015   Acute cystitis without hematuria 12/12/2014   Left elbow pain 11/30/2014   Health care maintenance 11/30/2014   Osteoporosis 10/19/2014   Rectal bleeding 10/19/2014   Neck pain 09/03/2014   Unsteady gait 09/03/2014   Nocturia 09/03/2014   Dysphagia 06/01/2014   Nasal dryness 06/01/2014   Stress 06/01/2014   Fatigue 02/27/2014   Pre-op evaluation 10/12/2013   Hoarseness 08/14/2013   Leg swelling 01/24/2013   CHF (congestive heart failure) ([place]) 12/29/2012   Cough 12/29/2012   OSA (obstructive sleep apnea) 07/09/2012   Osteoarthritis 07/04/2012   Anemia 07/04/2012   Chronic constipation 07/04/2012   Pulmonary hypertension ([place]) 07/04/2012   Pulmonary nodules 07/04/2012   Hypertension 07/04/2012   Hyperlipidemia 07/04/2012   Hypothyroidism 07/04/2012    Conditions to be addressed/monitored:HTN and Chronic Pain  Care Plan : Hypertension (Adult)  Updates made by Leona Singleton, RN since 02/09/2021 12:00 AM     Problem: Hypertension (Hypertension)   Priority: Medium     Goal: Hypertension Monitored   Start Date: 02/09/2021  Expected End Date: 08/01/2021  Priority: Medium  Note:   Objective:  Last practice recorded BP readings:  BP Readings from Last 3 Encounters:  12/27/20 (!) 130/47  11/10/20 120/60  11/02/20 112/72  Current Barriers:  Knowledge Deficits related to basic understanding of hypertension pathophysiology and self care management; patient reports she does not monitor blood pressures often at home.  Does state that when she does it is typically normal.  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  Would like Respite Care or Adult Day Care resources. Case Manager Clinical Goal(s):  patient will verbalize understanding of plan for hypertension management patient will demonstrate improved adherence to prescribed treatment plan for hypertension as evidenced by taking all medications as prescribed, monitoring and recording blood pressure as directed, adhering to low sodium/DASH diet Interventions:  Collaboration with Einar Pheasant, MD regarding development and update of comprehensive plan of care as evidenced by provider attestation and co-signature Inter-disciplinary care team collaboration (see longitudinal plan  of care) Evaluation of current treatment plan related to hypertension self management and patient's adherence to plan as established by provider. Provided education to patient re: stroke prevention, s/s of heart attack and stroke, DASH diet, complications of uncontrolled blood pressure Reviewed medications with patient and discussed importance of compliance Discussed plans with patient for ongoing care management follow up and provided patient with direct contact information for care management team Advised patient, providing education and rationale, to monitor blood pressure weekly and record, calling PCP for findings outside established parameters.  Reviewed scheduled/upcoming provider appointments including:  PCP 03/06/21 Encouraged to call provider office for new concerns, questions, or BP outside discussed parameters Encouraged to follow a low sodium diet/DASH diet Care guide referral placed for Adult Day Care resources Referred to CCM Social Worker for Respite Care resources Encouraged to check blood pressure weekly and write blood pressure results in a log to take to appointments for provider review Patient Goals/Self-Care Activities:: Check blood pressure weekly Write blood pressure results in a log and take log to  appointments for provider review Work with Care Guides and CCM Social Worker for Respite Care/Adult Day Care resources for your son Follow Up Plan: The care management team will reach out to the patient again over the next 20 business days.     Care Plan : Chronic Pain (Adult)  Updates made by Leona Singleton, RN since 02/09/2021 12:00 AM     Problem: Pain Management Plan (Chronic Pain)   Priority: Medium     Long-Range Goal: Pain Management Plan Developed   Start Date: 02/09/2021  Expected End Date: 08/01/2021  Priority: Medium  Note:   Current Barriers:  Knowledge Deficits related to self-health management of chronic pain; patient reporting severe pain to shoulder.  States her shoulder is "bone on bone" and she receives injections every 3 months.  Reports pain is causing her to have to sleep in recliner. Chronic Disease Management support and education needs related to chronic pain Clinical Goal(s):  patient will verbalize understanding of plan for pain management. , patient will demonstrate use of different relaxation  skills and/or diversional activities to assist with pain reduction (distraction, imagery, relaxation, massage, acupressure, TENS, heat, and cold application., patient will use pharmacological and nonpharmacological pain relief strategies as prescribed. , and patient will verbalize acceptable level of pain relief and ability to engage in desired activities Interventions:  Collaboration with Einar Pheasant, MD regarding development and update of comprehensive plan of care as evidenced by provider attestation and co-signature Pain assessment performed Medications reviewed and encouraged medication compliance Discussed plans with patient for ongoing care management follow up and provided patient with direct contact information for care management team Evaluation of current treatment plan related to pain and patient's adherence to plan as established by provider. Care Guide  referral for adult day care options Social Work referral for Respite Care Resources Pain assessed and pain treatment goals reviewed Patient response to treatment assessed Discussed using ice and heat to help with pain Acknowledged patient as the expert in pain self-management Patient Goals/Self Care Activities:  Learn relaxation techniques and use relaxation during pain  Practice acceptance of chronic pain Practice relaxation or meditation daily Tell myself I can (not I can't) Use distraction techniques Take medication as prescribed Use Ice and Heat to help with pain Follow Up Plan: The care management team will reach out to the patient again over the next 20 business days.       Plan:The care management team will  reach out to the patient again over the next 20 business days.  Hubert Azure RN, MSN RN Care Management Coordinator [place] [phone] [email or web address]

## 2021-02-09 NOTE — Patient Instructions (Signed)
Visit Information   PATIENT GOALS:   Goals Addressed             This Visit's Progress    (RNCM) Cope with Chronic Pain       Timeframe:  Long-Range Goal Priority:  Medium Start Date:   02/09/21                          Expected End Date: 08/01/21                      Follow Up Date 02/19/21   Learn relaxation techniques and use relaxation during pain  Practice acceptance of chronic pain Practice relaxation or meditation daily Tell myself I can (not I can't) Use distraction techniques Take medication as prescribed Use Ice and Heat to help with pain   Why is this important?   Stress makes chronic pain feel worse.  Feelings like depression, anxiety, stress and anger can make your body more sensitive to pain.  Learning ways to cope with stress or depression may help you find some relief from the pain.     Notes:       (RNCM) Track and Manage My Blood Pressure-Hypertension       Timeframe:  Long-Range Goal Priority:  Medium Start Date:   02/09/21                          Expected End Date: 08/01/21                      Follow Up Date 02/19/21    Check blood pressure weekly Write blood pressure results in a log and take log to appointments for provider review Work with Care Guides and CCM Social Worker for Respite Care/Adult Day Care resources for your son   Why is this important?   You won't feel high blood pressure, but it can still hurt your blood vessels.  High blood pressure can cause heart or kidney problems. It can also cause a stroke.  Making lifestyle changes like losing a little weight or eating less salt will help.  Checking your blood pressure at home and at different times of the day can help to control blood pressure.  If the doctor prescribes medicine remember to take it the way the doctor ordered.  Call the office if you cannot afford the medicine or if there are questions about it.     Notes:          Consent to CCM Services: Ms. Mosco was given  information about Chronic Care Management services today including:  CCM service includes personalized support from designated clinical staff supervised by her physician, including individualized plan of care and coordination with other care providers 24/7 contact phone numbers for assistance for urgent and routine care needs. Service will only be billed when office clinical staff spend 20 minutes or more in a month to coordinate care. Only one practitioner may furnish and bill the service in a calendar month. The patient may stop CCM services at any time (effective at the end of the month) by phone call to the office staff. The patient will be responsible for cost sharing (co-pay) of up to 20% of the service fee (after annual deductible is met).  Patient agreed to services and verbal consent obtained.   Patient verbalizes understanding of instructions provided today and agrees to view in [place].  The care management team will reach out to the patient again over the next 20 business days.   Hubert Azure RN, MSN RN Care Management Coordinator [place] [phone] [email or web address]   CLINICAL CARE PLAN: Patient Care Plan: Hypertension (Adult)     Problem Identified: Hypertension (Hypertension)   Priority: Medium     Goal: Hypertension Monitored   Start Date: 02/09/2021  Expected End Date: 08/01/2021  Priority: Medium  Note:   Objective:  Last practice recorded BP readings:  BP Readings from Last 3 Encounters:  12/27/20 (!) 130/47  11/10/20 120/60  11/02/20 112/72  Current Barriers:  Knowledge Deficits related to basic understanding of hypertension pathophysiology and self care management; patient reports she does not monitor blood pressures often at home.  Does state that when she does it is typically normal.  She is primary caregiver for her 70 year old son who is mentally disabled.  Would like Respite Care or Adult Day Care  resources. Case Manager Clinical Goal(s):  patient will verbalize understanding of plan for hypertension management patient will demonstrate improved adherence to prescribed treatment plan for hypertension as evidenced by taking all medications as prescribed, monitoring and recording blood pressure as directed, adhering to low sodium/DASH diet Interventions:  Collaboration with Einar Pheasant, MD regarding development and update of comprehensive plan of care as evidenced by provider attestation and co-signature Inter-disciplinary care team collaboration (see longitudinal plan of care) Evaluation of current treatment plan related to hypertension self management and patient's adherence to plan as established by provider. Provided education to patient re: stroke prevention, s/s of heart attack and stroke, DASH diet, complications of uncontrolled blood pressure Reviewed medications with patient and discussed importance of compliance Discussed plans with patient for ongoing care management follow up and provided patient with direct contact information for care management team Advised patient, providing education and rationale, to monitor blood pressure weekly and record, calling PCP for findings outside established parameters.  Reviewed scheduled/upcoming provider appointments including:  PCP 03/06/21 Encouraged to call provider office for new concerns, questions, or BP outside discussed parameters Encouraged to follow a low sodium diet/DASH diet Care guide referral placed for Adult Day Care resources Referred to CCM Social Worker for Respite Care resources Encouraged to check blood pressure weekly and write blood pressure results in a log to take to appointments for provider review Patient Goals/Self-Care Activities:: Check blood pressure weekly Write blood pressure results in a log and take log to appointments for provider review Work with Care Guides and CCM Social Worker for Respite Care/Adult Day  Care resources for your son Follow Up Plan: The care management team will reach out to the patient again over the next 20 business days.     Patient Care Plan: Chronic Pain (Adult)     Problem Identified: Pain Management Plan (Chronic Pain)   Priority: Medium     Long-Range Goal: Pain Management Plan Developed   Start Date: 02/09/2021  Expected End Date: 08/01/2021  Priority: Medium  Note:   Current Barriers:  Knowledge Deficits related to self-health management of chronic pain; patient reporting severe pain to shoulder.  States her shoulder is "bone on bone" and she receives injections every 3 months.  Reports pain is causing her to have to sleep in recliner. Chronic Disease Management support and education needs related to chronic pain Clinical Goal(s):  patient will verbalize understanding of plan for pain management. , patient will demonstrate use of different relaxation  skills and/or diversional activities to assist with pain  reduction (distraction, imagery, relaxation, massage, acupressure, TENS, heat, and cold application., patient will use pharmacological and nonpharmacological pain relief strategies as prescribed. , and patient will verbalize acceptable level of pain relief and ability to engage in desired activities Interventions:  Collaboration with Einar Pheasant, MD regarding development and update of comprehensive plan of care as evidenced by provider attestation and co-signature Pain assessment performed Medications reviewed and encouraged medication compliance Discussed plans with patient for ongoing care management follow up and provided patient with direct contact information for care management team Evaluation of current treatment plan related to pain and patient's adherence to plan as established by provider. Care Guide referral for adult day care options Social Work referral for Respite Care Resources Pain assessed and pain treatment goals reviewed Patient response  to treatment assessed Discussed using ice and heat to help with pain Acknowledged patient as the expert in pain self-management Patient Goals/Self Care Activities:  Learn relaxation techniques and use relaxation during pain  Practice acceptance of chronic pain Practice relaxation or meditation daily Tell myself I can (not I can't) Use distraction techniques Take medication as prescribed Use Ice and Heat to help with pain Follow Up Plan: The care management team will reach out to the patient again over the next 20 business days.

## 2021-02-14 ENCOUNTER — Telehealth: Payer: Self-pay

## 2021-02-14 DIAGNOSIS — I350 Nonrheumatic aortic (valve) stenosis: Secondary | ICD-10-CM | POA: Diagnosis not present

## 2021-02-14 DIAGNOSIS — I872 Venous insufficiency (chronic) (peripheral): Secondary | ICD-10-CM | POA: Diagnosis not present

## 2021-02-14 DIAGNOSIS — I340 Nonrheumatic mitral (valve) insufficiency: Secondary | ICD-10-CM | POA: Diagnosis not present

## 2021-02-14 DIAGNOSIS — R001 Bradycardia, unspecified: Secondary | ICD-10-CM | POA: Diagnosis not present

## 2021-02-14 DIAGNOSIS — E7800 Pure hypercholesterolemia, unspecified: Secondary | ICD-10-CM | POA: Diagnosis not present

## 2021-02-14 DIAGNOSIS — I5032 Chronic diastolic (congestive) heart failure: Secondary | ICD-10-CM | POA: Diagnosis not present

## 2021-02-14 NOTE — Telephone Encounter (Signed)
Telephone encounter was:  Successful.  02/14/2021 Name: Kimberly Roy MRN: [id number] DOB: Jan 11, 1931  Kimberly Roy is a 87 y.o. year old female who is a primary care patient of Einar Pheasant, MD . The community resource team was consulted for assistance with Adult day care and respite care resources  Care guide performed the following interventions: Adult day care and respite care resources.  Follow Up Plan:  Care guide will follow up with patient by phone over the next 7 days  Edna Grover, AAS Paralegal, [facility]  [address], [place] [address] ??[email or web address]  ?? [phone]   [email or web address]

## 2021-02-16 ENCOUNTER — Other Ambulatory Visit: Payer: Self-pay

## 2021-02-16 ENCOUNTER — Telehealth: Payer: Self-pay | Admitting: Internal Medicine

## 2021-02-16 MED ORDER — FUROSEMIDE 20 MG PO TABS: 20.0000 mg | ORAL_TABLET | Freq: Every day | ORAL | 0 refills | Status: DC | PRN

## 2021-02-16 NOTE — Telephone Encounter (Signed)
Patient called and let her know refill sent to [facility].

## 2021-02-16 NOTE — Telephone Encounter (Signed)
PT called to request a refill of their furosemide (LASIX) 20 MG tablet to be called in.

## 2021-02-17 ENCOUNTER — Other Ambulatory Visit: Payer: Self-pay | Admitting: Internal Medicine

## 2021-02-19 ENCOUNTER — Telehealth: Payer: Self-pay

## 2021-02-19 ENCOUNTER — Ambulatory Visit: Payer: PPO | Admitting: *Deleted

## 2021-02-19 DIAGNOSIS — E039 Hypothyroidism, unspecified: Secondary | ICD-10-CM | POA: Diagnosis not present

## 2021-02-19 DIAGNOSIS — I10 Essential (primary) hypertension: Secondary | ICD-10-CM

## 2021-02-19 DIAGNOSIS — M810 Age-related osteoporosis without current pathological fracture: Secondary | ICD-10-CM | POA: Diagnosis not present

## 2021-02-19 DIAGNOSIS — G8929 Other chronic pain: Secondary | ICD-10-CM

## 2021-02-19 MED ORDER — SYNTHROID 100 MCG PO TABS: 100.0000 ug | ORAL_TABLET | Freq: Every day | ORAL | 3 refills | Status: DC

## 2021-02-19 MED ORDER — METOPROLOL SUCCINATE ER 50 MG PO TB24: 50.0000 mg | ORAL_TABLET | Freq: Every day | ORAL | 1 refills | Status: DC

## 2021-02-19 NOTE — Telephone Encounter (Signed)
-----  Message from Leona Singleton, RN sent at 02/19/2021  2:10 PM EDT ----- Regarding: Groin Pain and Medication refills Hi Ladies,  I briefly spoke with Ms. Hernan this afternoon and she wanted me to relay these below messages to you guys.  1-pain to her left groin she relates due to "bladder insulation"; thinks she may have pulled her left groin muscle and would like to know what treatment/medicine can be prescribed to help with pain in the left groin.  Reports pain is worse with ambulation.   2-states she needs refill of Toprol she is taking 50 mg daily in the morning (needs 90 day supply)  3-also refill of her Synthroid (was told by pharmacy PCP denied medication and she is not sure why)  Thanks,  Hubert Azure RN, MSN RN Care Management Coordinator [place] [phone] [email or web address]

## 2021-02-19 NOTE — Telephone Encounter (Signed)
Patient is going to urgent care about her groin pain. Toprol and synthroid sent in

## 2021-02-19 NOTE — Chronic Care Management (AMB) (Signed)
Chronic Care Management   CCM RN Visit Note  02/19/2021 Name: Kimberly Roy MRN: [id number] DOB: [birth year]  Subjective: Kimberly Roy is a 87 y.o. year old female who is a primary care patient of Einar Pheasant, MD. The care management team was consulted for assistance with disease management and care coordination needs.    Engaged with patient by telephone for follow up visit in response to provider referral for case management and/or care coordination services.   Consent to Services:  The patient was given information about Chronic Care Management services, agreed to services, and gave verbal consent prior to initiation of services.  Please see initial visit note for detailed documentation.   Patient agreed to services and verbal consent obtained.   Assessment: Review of patient past medical history, allergies, medications, health status, including review of consultants reports, laboratory and other test data, was performed as part of comprehensive evaluation and provision of chronic care management services.   SDOH (Social Determinants of Health) assessments and interventions performed:    CCM Care Plan  Allergies  Allergen Reactions   Lyrica [Pregabalin] Swelling   Omnicef [Cefdinir] Diarrhea and Nausea And Vomiting   Atarax [Hydroxyzine]     jittery   Dicyclomine Other (See Comments)    Abdominal bloating    Hydroxyzine Hcl     jittery   Levaquin [Levofloxacin] Swelling   Nitrofurantoin Diarrhea   Nucynta Er [Tapentadol Hcl Er] Other (See Comments)    Severe constipation    Oxybutynin Other (See Comments)    Blurred vision   Zoloft [Sertraline Hcl]     Severe headache   Biaxin [Clarithromycin] Other (See Comments) and Rash    Pt does not remember Pt does not remember   Sertraline Nausea And Vomiting    Severe headache Severe headache Other reaction(s): Headache Severe headache   Sulfa Antibiotics Rash    Pt does not remember   Sulfasalazine Rash    Pt does not  remember   Tape Rash    Durabond - redness   Tapentadol Other (See Comments) and Rash    _0     Outpatient Encounter Medications as of 02/19/2021  Medication Sig Note   metoprolol succinate (TOPROL-XL) 100 MG 24 hr tablet TAKE 1 TABLET BY MOUTH TWICE A DAY WITH OR IMMEDIATELY FOLLOWING A MEAL. (Patient taking differently: Take 50 mg by mouth in the morning and at bedtime.) 02/19/2021: Reports taking 50 mg daily in the morning   albuterol (VENTOLIN HFA) 108 (90 Base) MCG/ACT inhaler INHALE 2 PUFFS INTO THE LUNGS EVERY 6 (SIX) HOURS AS NEEDED FOR WHEEZING OR SHORTNESS OF BREATH    aluminum-magnesium hydroxide-simethicone (MAALOX) 200-200-20 MG/5ML SUSP Take 15 mLs by mouth 4 (four) times daily -  before meals and at bedtime.  02/09/2021: Reports taking as needed   amLODipine (NORVASC) 10 MG tablet TAKE 1 TABLET BY MOUTH DAILY    aspirin 81 MG EC tablet Take by mouth.    atorvastatin (LIPITOR) 20 MG tablet Take 1 tablet (20 mg total) by mouth at bedtime.    Azelastine HCl 137 MCG/SPRAY SOLN PLACE 1 SPRAY INTO BOTH NOSTRILS TWO TIMES DAILY    benazepril (LOTENSIN) 40 MG tablet TAKE 1 TABLET BY MOUTH DAILY    betamethasone dipropionate 0.05 % cream Apply 1 application topically 2 (two) times daily. 02/09/2021: Reports uses as needed   budesonide-formoterol (SYMBICORT) 80-4.5 MCG/ACT inhaler Inhale 2 puffs into the lungs 2 (two) times daily. (  Patient not taking: Reported on 02/09/2021) 02/09/2021: Reports not taking at this time   busPIRone (BUSPAR) 10 MG tablet Take 10 mg by mouth 2 (two) times daily.     calcium citrate-vitamin D (CITRACAL+D) 315-200 MG-UNIT tablet Take 1 tablet by mouth daily.    cephALEXin (KEFLEX) 250 MG capsule Take 250 mg by mouth daily.    Cholecalciferol (VITAMIN D3) 1000 UNITS CAPS Take 1 capsule by mouth daily.    Diclofenac Sodium 3 % GEL Apply topically 2 (two) times daily.  02/09/2021: As needed   Docusate Sodium (DSS) 100 MG CAPS Take by mouth.    DULoxetine (CYMBALTA) 60 MG capsule TAKE 1 CAPSULE BY MOUTH EVERY DAY.    fluocinonide (LIDEX) 0.05 % external solution Apply topically.    fluticasone (FLONASE) 50 MCG/ACT nasal spray Place 2 sprays into both nostrils daily. (Patient not taking: Reported on 02/09/2021)    furosemide (LASIX) 20 MG tablet Take 1 tablet (20 mg total) by mouth daily as needed.    gabapentin (NEURONTIN) 300 MG capsule TAKE 2 CAPSULES BY MOUTH 3 TIMES A DAY. 02/09/2021: Reports taking twice a day   hydrochlorothiazide (MICROZIDE) 12.5 MG capsule Take 12.5 mg by mouth daily. (Patient not taking: Reported on 02/09/2021) 02/09/2021: Reports not taking   HYDROcodone-acetaminophen (NORCO) 10-325 MG tablet Take 1 tablet by mouth 3 (three) times daily.    ketoconazole (NIZORAL) 2 % shampoo Apply 1 application topically 2 (two) times a week.    magnesium oxide (MAG-OX) 400 MG tablet Take 1 tablet (400 mg total) by mouth daily.    Melatonin 10 MG CAPS Take 1 capsule by mouth.    montelukast (SINGULAIR) 10 MG tablet 10 mg once daily as needed    morphine (MS CONTIN) 60 MG 12 hr tablet Take 1 tablet (60 mg total) by mouth 2 (two) times daily.    mupirocin ointment (BACTROBAN) 2 % Apply 1 application topically 2 (two) times daily.    Naldemedine Tosylate 0.2 MG TABS Take 0.2 mg by mouth daily.    polyethylene glycol powder (GLYCOLAX/MIRALAX) 17 GM/SCOOP powder MIX 17 GRAMS AS MARKED ON BOTTLE TOP IN 8 OUNCES OF WATER AND DRINK ONCE A DAY AS DIRECTED.    RABEprazole (ACIPHEX) 20 MG tablet TAKE 1 TABLET BY MOUTH TWICE A DAY BEFORE A MEAL.    senna (SENOKOT) 8.6 MG TABS tablet Take 1 tablet by mouth 2 (two) times daily.    Simethicone (GAS-X PO) Take 2 tablets by mouth 2 (two) times daily.    sodium chloride (OCEAN) 0.65 % nasal spray Place 1 spray into the nose as needed.    SYNTHROID 100 MCG tablet TAKE 1 TABLET BY MOUTH DAILY    traZODone (DESYREL) 50 MG  tablet Take 25-50 mg by mouth at bedtime as needed.    triamcinolone ointment (KENALOG) 0.1 % SMARTSIG:1 Application Topical 2-3 Times Daily    No facility-administered encounter medications on file as of 02/19/2021.    Patient Active Problem List   Diagnosis Date Noted   Aortic atherosclerosis ([place]) 11/20/2020   Bradycardia 06/08/2020   Itching 04/08/2020   Dyspnea 01/17/2020   Chronic respiratory failure with hypoxia ([place]) 01/17/2020   Right hip pain 01/10/2020   Leukocytosis 11/15/2019   Wound of buttock 10/25/2019   COVID-19 virus infection 10/04/2019   Urinary frequency 09/26/2019   Cold feeling 09/12/2019   Left knee pain 07/04/2019   Hemoptysis 06/30/2019   Femur fracture ([place]) 08/06/2018   Primary osteoarthritis of right shoulder 08/15/2017  Right shoulder pain 05/16/2017   Chronic venous insufficiency 02/10/2017   Lymphedema 02/10/2017   Neuropathy 12/25/2016   Anemia due to blood loss, chronic 08/07/2016   GERD (gastroesophageal reflux disease) 12/10/2015   Finger pain 11/13/2015   Carotid artery calcification 09/26/2015   External nasal lesion 09/25/2015   Muscle cramps 09/01/2015   Excessive sweating 07/30/2015   Headache 07/16/2015   Groin pain 05/14/2015   Muscle twitching 03/05/2015   Leg cramps 02/11/2015   Back pain 02/11/2015   Abdominal pain 02/11/2015   Chronic pain 01/19/2015   Acute cystitis without hematuria 12/12/2014   Left elbow pain 11/30/2014   Health care maintenance 11/30/2014   Osteoporosis 10/19/2014   Rectal bleeding 10/19/2014   Neck pain 09/03/2014   Unsteady gait 09/03/2014   Nocturia 09/03/2014   Dysphagia 06/01/2014   Nasal dryness 06/01/2014   Stress 06/01/2014   Fatigue 02/27/2014   Degenerative disc disease 12/21/2013   Pre-op evaluation 10/12/2013   Hoarseness 08/14/2013   Leg swelling 01/24/2013   CHF (congestive heart failure) ([place]) 12/29/2012   Cough 12/29/2012   OSA (obstructive sleep apnea) 07/09/2012    Osteoarthritis 07/04/2012   Anemia 07/04/2012   Chronic constipation 07/04/2012   Pulmonary hypertension ([place]) 07/04/2012   Pulmonary nodules 07/04/2012   Hypertension 07/04/2012   Hyperlipidemia 07/04/2012   Hypothyroidism 07/04/2012    Conditions to be addressed/monitored:HTN and Pain  Care Plan : Hypertension (Adult)  Updates made by Leona Singleton, RN since 02/19/2021 12:00 AM     Problem: Hypertension (Hypertension)   Priority: Medium     Goal: Hypertension Monitored   Start Date: 02/09/2021  Expected End Date: 08/01/2021  Priority: Medium  Note:   Objective:  Last practice recorded BP readings:  BP Readings from Last 3 Encounters:  12/27/20 (!) 130/47  11/10/20 120/60  11/02/20 112/72  Current Barriers:  Knowledge Deficits related to basic understanding of hypertension pathophysiology and self care management; patient reports she does not monitor blood pressures often at home.  Does state that when she does it is typically normal.  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  Would like Respite Care or Adult Day Care resources.  Did not speak with patient long due to leaving for dental appointment Case Manager Clinical Goal(s):  patient will verbalize understanding of plan for hypertension management patient will demonstrate improved adherence to prescribed treatment plan for hypertension as evidenced by taking all medications as prescribed, monitoring and recording blood pressure as directed, adhering to low sodium/DASH diet Interventions:  Collaboration with Einar Pheasant, MD regarding development and update of comprehensive plan of care as evidenced by provider attestation and co-signature Inter-disciplinary care team collaboration (see longitudinal plan of care) Evaluation of current treatment plan related to hypertension self management and patient's adherence to plan as established by provider. Provided education to patient re:  stroke prevention, s/s of heart attack and stroke, DASH diet, complications of uncontrolled blood pressure Reviewed medications with patient and discussed importance of compliance Discussed plans with patient for ongoing care management follow up and provided patient with direct contact information for care management team Advised patient, providing education and rationale, to monitor blood pressure weekly and record, calling PCP for findings outside established parameters.  Reviewed scheduled/upcoming provider appointments including:  PCP 03/06/21 Encouraged to call provider office for new concerns, questions, or BP outside discussed parameters Encouraged to follow a low sodium diet/DASH diet Care guide referral placed for Adult Day Care resources  Referred to CCM Education officer, museum for Respite Care resources Encouraged to check blood pressure weekly and write blood pressure results in a log to take to appointments for provider review Patient Goals/Self-Care Activities:: Check blood pressure weekly Write blood pressure results in a log and take log to appointments for provider review Work with Care Guides and CCM Social Worker for Respite Care/Adult Day Care resources for your son Follow Up Plan: The care management team will reach out to the patient again over the next 30 business days.     Care Plan : Chronic Pain (Adult)  Updates made by Leona Singleton, RN since 02/19/2021 12:00 AM     Problem: Pain Management Plan (Chronic Pain)   Priority: Medium     Long-Range Goal: Pain Management Plan Developed   Start Date: 02/09/2021  Expected End Date: 08/01/2021  Priority: Medium  Note:   Current Barriers:  Knowledge Deficits related to self-health management of chronic pain; patient reporting severe pain to shoulder.  States her shoulder is "bone on bone" and she receives injections every 3 months.  Reports pain is causing her to have to sleep in recliner. Did not speak with patient long due to  her having to leave for dental appointment.  Does report pain to her left groin she relates due to "bladder insulation"; thinks she may have pulled her left groin muscle and would like to know what treatment/medicine can be prescribed to help with pain in the left groin.  Reports pain is worse with ambulation.  Also have questions about medication refills.  States she needs refill of Toprol she is taking 50 mg daily in the morning (needs 90 day supply) and also refill of her Synthroid (was told by pharmacy PCP denied medication and she is not sure why) Chronic Disease Management support and education needs related to chronic pain Clinical Goal(s):  patient will verbalize understanding of plan for pain management. , patient will demonstrate use of different relaxation  skills and/or diversional activities to assist with pain reduction (distraction, imagery, relaxation, massage, acupressure, TENS, heat, and cold application., patient will use pharmacological and nonpharmacological pain relief strategies as prescribed. , and patient will verbalize acceptable level of pain relief and ability to engage in desired activities Interventions:  Collaboration with Einar Pheasant, MD regarding development and update of comprehensive plan of care as evidenced by provider attestation and co-signature Pain assessment performed Medications reviewed and encouraged medication compliance Discussed plans with patient for ongoing care management follow up and provided patient with direct contact information for care management team Evaluation of current treatment plan related to pain and patient's adherence to plan as established by provider. Care Guide referral for adult day care options Social Work referral for Respite Care Resources Pain assessed and pain treatment goals reviewed Patient response to treatment assessed Discussed using ice and heat to help with pain Acknowledged patient as the expert in pain  self-management Discussed urinary bladder training exercises (bladder insulation) and thinks she has pulled her left groin muscle during these therapies Patient Goals/Self Care Activities:  Learn relaxation techniques and use relaxation during pain  Practice acceptance of chronic pain Practice relaxation or meditation daily Tell myself I can (not I can't) Use distraction techniques Take medication as prescribed Use Ice and Heat to help with pain Follow Up Plan: The care management team will reach out to the patient again over the next 30 business days.       Plan:The care management team will reach out to  the patient again over the next 30 business days.  Hubert Azure RN, MSN RN Care Management Coordinator [place] [phone] [email or web address]

## 2021-02-19 NOTE — Telephone Encounter (Signed)
Toprol directions changed to 50 mg q day per Dr Nicki Reaper. Patient has been taking this dose for almost a year

## 2021-02-19 NOTE — Telephone Encounter (Signed)
   Telephone encounter was:  Successful.  02/19/2021 Name: [name] MRN: [id number] DOB: 04-14-1931  [name] is a 87 y.o. year old female who is a primary care patient of Einar Pheasant, MD . The community resource team was consulted for assistance with Adult day care and respite care resources  Care guide performed the following interventions: Adult day care and respite care resources.  Follow Up Plan:  No further follow up planned at this time. The patient has been provided with needed resources.  Katherene Dinino, AAS Paralegal, [facility]  [address], [place] [address] ??[email or web address]  ?? [phone]   [email or web address]

## 2021-02-19 NOTE — Patient Instructions (Signed)
Visit Information  PATIENT GOALS:  Goals Addressed             This Visit's Progress    (RNCM) Cope with Chronic Pain       Timeframe:  Long-Range Goal Priority:  Medium Start Date:   02/09/21                          Expected End Date: 08/01/21                      Follow Up Date 7/8//22   Learn relaxation techniques and use relaxation during pain  Practice acceptance of chronic pain Practice relaxation or meditation daily Tell myself I can (not I can't) Use distraction techniques Take medication as prescribed Use Ice and Heat to help with pain   Why is this important?   Stress makes chronic pain feel worse.  Feelings like depression, anxiety, stress and anger can make your body more sensitive to pain.  Learning ways to cope with stress or depression may help you find some relief from the pain.     Notes:       (RNCM) Track and Manage My Blood Pressure-Hypertension       Timeframe:  Long-Range Goal Priority:  Medium Start Date:   02/09/21                          Expected End Date: 08/01/21                      Follow Up Date  7/822    Check blood pressure weekly Write blood pressure results in a log and take log to appointments for provider review Work with Care Guides and CCM Social Worker for Respite Care/Adult Day Care resources for your son   Why is this important?   You won't feel high blood pressure, but it can still hurt your blood vessels.  High blood pressure can cause heart or kidney problems. It can also cause a stroke.  Making lifestyle changes like losing a little weight or eating less salt will help.  Checking your blood pressure at home and at different times of the day can help to control blood pressure.  If the doctor prescribes medicine remember to take it the way the doctor ordered.  Call the office if you cannot afford the medicine or if there are questions about it.     Notes:          Patient verbalizes understanding of instructions  provided today and agrees to view in [place].   The care management team will reach out to the patient again over the next 30 business days.   Hubert Azure RN, MSN RN Care Management Coordinator [place] [phone] [email or web address]

## 2021-02-20 ENCOUNTER — Encounter: Payer: Self-pay | Admitting: Emergency Medicine

## 2021-02-20 ENCOUNTER — Ambulatory Visit
Admission: EM | Admit: 2021-02-20 | Discharge: 2021-02-20 | Disposition: A | Discharge status: Home / Self Care | Payer: PPO

## 2021-02-20 ENCOUNTER — Other Ambulatory Visit: Payer: Self-pay

## 2021-02-20 DIAGNOSIS — I10 Essential (primary) hypertension: Secondary | ICD-10-CM | POA: Diagnosis not present

## 2021-02-20 DIAGNOSIS — R1032 Left lower quadrant pain: Secondary | ICD-10-CM

## 2021-02-20 NOTE — ED Provider Notes (Signed)
Roderic Palau    CSN: [id number] Arrival date & time: 02/20/21  1042      History   Chief Complaint Chief Complaint  Patient presents with   Leg Pain   Groin Pain    left    HPI Kimberly Roy is a 87 y.o. female.   Accompanied by her niece/caregiver, patient presents with 2-week history of left groin pain.  No falls or trauma.  She thinks she may have pulled a muscle when spreading her legs for her bladder procedure.  She denies fever, rash, wounds, abdominal pain, dysuria, or other symptoms.  No treatments attempted at home other than her usual medications.  She is on morphine at home.  Her medical history includes hypertension, pulmonary hypertension, CHF, DVT, chronic pain, arthritis, DDD.  The history is provided by the patient, a caregiver and medical records.   Past Medical History:  Diagnosis Date   Anemia    Anxiety    Chest pain    CHF (congestive heart failure) ([place])    Constipation    DDD (degenerative disc disease), cervical    Depression    DVT (deep venous thrombosis) (HCC)    Dysphonia    Dyspnea    Fatty liver    Fatty liver    Headache    Hyperlipidemia    Hyperpiesia    Hypertension    Hypothyroidism    Interstitial cystitis    Left ventricular dysfunction    Lymphedema    Nephrolithiasis    Obstructive sleep apnea    Osteoarthritis    knees/cervical and lumbar spine   Pulmonary hypertension (HCC)    Pulmonary nodules    followed by Dr Raul Del   Pure hypercholesterolemia    Renal cyst    right    Patient Active Problem List   Diagnosis Date Noted   Aortic atherosclerosis ([place]) 11/20/2020   Bradycardia 06/08/2020   Itching 04/08/2020   Dyspnea 01/17/2020   Chronic respiratory failure with hypoxia ([place]) 01/17/2020   Right hip pain 01/10/2020   Leukocytosis 11/15/2019   Wound of buttock 10/25/2019   COVID-19 virus infection 10/04/2019   Urinary frequency 09/26/2019   Cold feeling 09/12/2019   Left knee pain 07/04/2019    Hemoptysis 06/30/2019   Femur fracture ([place]) 08/06/2018   Primary osteoarthritis of right shoulder 08/15/2017   Right shoulder pain 05/16/2017   Chronic venous insufficiency 02/10/2017   Lymphedema 02/10/2017   Neuropathy 12/25/2016   Anemia due to blood loss, chronic 08/07/2016   GERD (gastroesophageal reflux disease) 12/10/2015   Finger pain 11/13/2015   Carotid artery calcification 09/26/2015   External nasal lesion 09/25/2015   Muscle cramps 09/01/2015   Excessive sweating 07/30/2015   Headache 07/16/2015   Groin pain 05/14/2015   Muscle twitching 03/05/2015   Leg cramps 02/11/2015   Back pain 02/11/2015   Abdominal pain 02/11/2015   Chronic pain 01/19/2015   Acute cystitis without hematuria 12/12/2014   Left elbow pain 11/30/2014   Health care maintenance 11/30/2014   Osteoporosis 10/19/2014   Rectal bleeding 10/19/2014   Neck pain 09/03/2014   Unsteady gait 09/03/2014   Nocturia 09/03/2014   Dysphagia 06/01/2014   Nasal dryness 06/01/2014   Stress 06/01/2014   Fatigue 02/27/2014   Degenerative disc disease 12/21/2013   Pre-op evaluation 10/12/2013   Hoarseness 08/14/2013   Leg swelling 01/24/2013   CHF (congestive heart failure) ([place]) 12/29/2012   Cough 12/29/2012   OSA (obstructive sleep apnea) 07/09/2012   Osteoarthritis 07/04/2012  Anemia 07/04/2012   Chronic constipation 07/04/2012   Pulmonary hypertension ([place]) 07/04/2012   Pulmonary nodules 07/04/2012   Hypertension 07/04/2012   Hyperlipidemia 07/04/2012   Hypothyroidism 07/04/2012    Past Surgical History:  Procedure Laterality Date   ABDOMINAL HYSTERECTOMY     ovaries left in place   APPENDECTOMY     Back Surgeries     BACK SURGERY     BREAST REDUCTION SURGERY     3/99   CARDIAC CATHETERIZATION     cataracts Bilateral    CERVICAL SPINE SURGERY     ESOPHAGEAL MANOMETRY N/A 08/02/2015   Procedure: ESOPHAGEAL MANOMETRY (EM);  Surgeon: Josefine Class, MD;  Location[facility] END[facility]COPY;   Service: Endoscopy;  Laterality: N/A;   ESOPHAGOGASTRODUODENOSCOPY N/A 02/27/2015   Procedure: ESOPHAGOGASTRODUODENOSCOPY (EGD);  Surgeon: Hulen Luster, MD;  Location: [facility] ENDOSCOPY;  Service: Gastroenterology;  Laterality: N/A;   ESOPHAGOGASTRODUODENOSCOPY (EGD) WITH PROPOFOL N/A 10/30/2018   Procedure: ESOPHAGOGASTRODUODENOSCOPY (EGD) WITH PROPOFOL;  Surgeon: Lollie Sails, MD;  Location: [facility] ENDOSCOPY;  Service: Endoscopy;  Laterality: N/A;   EXCISIONAL HEMORRHOIDECTOMY     EYE SURGERY     FRACTURE SURGERY     HEMORRHOID SURGERY     HIP SURGERY  2013   Right hip surgery   JOINT REPLACEMENT     KNEE ARTHROSCOPY     left and right   ORIF FEMUR FRACTURE Right 08/07/2018   Procedure: OPEN REDUCTION INTERNAL FIXATION (ORIF) DISTAL FEMUR FRACTURE;  Surgeon: Hessie Knows, MD;  Location: [facility] ORS;  Service: Orthopedics;  Laterality: Right;   REDUCTION MAMMAPLASTY Bilateral YRS AGO   REPLACEMENT TOTAL KNEE Bilateral    rotator cuff surgery     blilateral   TONSILECTOMY/ADENOIDECTOMY WITH MYRINGOTOMY     VISCERAL ARTERY INTERVENTION N/A 02/25/2019   Procedure: VISCERAL ARTERY INTERVENTION;  Surgeon: Algernon Huxley, MD;  Location: [facility] CV LAB;  Service: Cardiovascular;  Laterality: N/A;    OB History   No obstetric history on file.      Home Medications    Prior to Admission medications   Medication Sig Start Date End Date Taking? Authorizing Provider  HYDROcodone-acetaminophen (NORCO) 10-325 MG tablet Take 1 tablet by mouth 3 (three) times daily. 08/10/18  Yes Fritzi Mandes, MD  morphine (MS CONTIN) 60 MG 12 hr tablet Take 1 tablet (60 mg total) by mouth 2 (two) times daily. 08/10/18  Yes Fritzi Mandes, MD  albuterol (VENTOLIN HFA) 108 (90 Base) MCG/ACT inhaler INHALE 2 PUFFS INTO THE LUNGS EVERY 6 (SIX) HOURS AS NEEDED FOR WHEEZING OR SHORTNESS OF BREATH 11/13/19   Scot Jun, FNP  aluminum-magnesium hydroxide-simethicone (MAALOX) 200-200-20 MG/5ML SUSP Take 15 mLs by mouth 4  (four) times daily -  before meals and at bedtime.     [provider]  amLODipine (NORVASC) 10 MG tablet TAKE 1 TABLET BY MOUTH DAILY 11/21/20   Einar Pheasant, MD  aspirin 81 MG EC tablet Take by mouth.    [provider]  atorvastatin (LIPITOR) 20 MG tablet Take 1 tablet (20 mg total) by mouth at bedtime. 09/05/20   Einar Pheasant, MD  Azelastine HCl 137 MCG/SPRAY SOLN PLACE 1 SPRAY INTO BOTH NOSTRILS TWO TIMES DAILY 11/20/20   Einar Pheasant, MD  benazepril (LOTENSIN) 40 MG tablet TAKE 1 TABLET BY MOUTH DAILY 11/21/20   Einar Pheasant, MD  betamethasone dipropionate 0.05 % cream Apply 1 application topically 2 (two) times daily. 10/19/20   [provider]  budesonide-formoterol (SYMBICORT) 80-4.5 MCG/ACT inhaler  Inhale 2 puffs into the lungs 2 (two) times daily. Patient not taking: Reported on 02/09/2021 07/07/20   Noemi Chapel P, DO  busPIRone (BUSPAR) 10 MG tablet Take 10 mg by mouth 2 (two) times daily.  03/31/18   [provider]  calcium citrate-vitamin D (CITRACAL+D) 315-200 MG-UNIT tablet Take 1 tablet by mouth daily.    [provider]  cephALEXin (KEFLEX) 250 MG capsule Take 250 mg by mouth daily. 06/06/20   [provider]  Cholecalciferol (VITAMIN D3) 1000 UNITS CAPS Take 1 capsule by mouth daily.    [provider]  Diclofenac Sodium 3 % GEL Apply topically 2 (two) times daily. 12/20/19   [provider]  Docusate Sodium (DSS) 100 MG CAPS Take by mouth.    [provider]  DULoxetine (CYMBALTA) 60 MG capsule TAKE 1 CAPSULE BY MOUTH EVERY DAY. 12/25/16   Einar Pheasant, MD  fluocinonide (LIDEX) 0.05 % external solution Apply topically. 05/04/20   [provider]  fluticasone (FLONASE) 50 MCG/ACT nasal spray Place 2 sprays into both nostrils daily. Patient not taking: Reported on 02/09/2021 03/30/20   Einar Pheasant, MD  furosemide (LASIX) 20 MG tablet Take 1 tablet (20 mg total) by mouth daily as needed.  02/16/21 05/17/21  Einar Pheasant, MD  gabapentin (NEURONTIN) 300 MG capsule TAKE 2 CAPSULES BY MOUTH 3 TIMES A DAY. 04/27/20   Einar Pheasant, MD  hydrochlorothiazide (MICROZIDE) 12.5 MG capsule Take 12.5 mg by mouth daily. Patient not taking: Reported on 02/09/2021    [provider]  ketoconazole (NIZORAL) 2 % shampoo Apply 1 application topically 2 (two) times a week. 05/04/20   [provider]  magnesium oxide (MAG-OX) 400 MG tablet Take 1 tablet (400 mg total) by mouth daily. 09/22/20   Einar Pheasant, MD  Melatonin 10 MG CAPS Take 1 capsule by mouth.    [provider]  metoprolol succinate (TOPROL-XL) 50 MG 24 hr tablet Take 1 tablet (50 mg total) by mouth daily. Take with or immediately following a meal. 02/19/21   Einar Pheasant, MD  montelukast (SINGULAIR) 10 MG tablet 10 mg once daily as needed 12/23/19   [provider]  mupirocin ointment (BACTROBAN) 2 % Apply 1 application topically 2 (two) times daily. 11/10/20   Einar Pheasant, MD  Naldemedine Tosylate 0.2 MG TABS Take 0.2 mg by mouth daily.    [provider]  polyethylene glycol powder (GLYCOLAX/MIRALAX) 17 GM/SCOOP powder MIX 17 GRAMS AS MARKED ON BOTTLE TOP IN 8 OUNCES OF WATER AND DRINK ONCE A DAY AS DIRECTED. 02/05/19   Einar Pheasant, MD  RABEprazole (ACIPHEX) 20 MG tablet TAKE 1 TABLET BY MOUTH TWICE A DAY BEFORE A MEAL. 11/27/20   Einar Pheasant, MD  senna (SENOKOT) 8.6 MG TABS tablet Take 1 tablet by mouth 2 (two) times daily.    [provider]  Simethicone (GAS-X PO) Take 2 tablets by mouth 2 (two) times daily.    [provider]  sodium chloride (OCEAN) 0.65 % nasal spray Place 1 spray into the nose as needed.    [provider]  SYNTHROID 100 MCG tablet Take 1 tablet (100 mcg total) by mouth daily. 02/19/21   Einar Pheasant, MD  traZODone (DESYREL) 50 MG tablet Take 25-50 mg by mouth at bedtime as needed. 10/08/19   [provider]  triamcinolone  ointment (KENALOG) 0.1 % SMARTSIG:1 Application Topical 2-3 Times Daily 10/19/20   [provider]    Family History Family History  Problem  Relation Age of Onset   Heart disease Mother    Stroke Mother    Hypertension Mother    Heart disease Father        myocardial infarction age 60   Breast cancer Neg Hx     Social History Social History   Tobacco Use   Smoking status: Never   Smokeless tobacco: Never  Vaping Use   Vaping Use: Never used  Substance Use Topics   Alcohol use: No    Alcohol/week: 0.0 standard drinks   Drug use: Never     Allergies   Lyrica [pregabalin], Omnicef [cefdinir], Atarax [hydroxyzine], Dicyclomine, Hydroxyzine hcl, Levaquin [levofloxacin], Nitrofurantoin, Nucynta er [tapentadol hcl er], Oxybutynin, Zoloft [sertraline hcl], Biaxin [clarithromycin], Sertraline, Sulfa antibiotics, Sulfasalazine, Tape, and Tapentadol   Review of Systems Review of Systems  Constitutional:  Negative for chills and fever.  Respiratory:  Negative for cough and shortness of breath.   Cardiovascular:  Negative for chest pain and palpitations.  Gastrointestinal:  Negative for abdominal pain and vomiting.  Genitourinary:  Negative for dysuria and hematuria.  Musculoskeletal:  Positive for myalgias. Negative for joint swelling.  Skin:  Negative for color change and rash.  All other systems reviewed and are negative.   Physical Exam Triage Vital Signs ED Triage Vitals  Enc Vitals Group     BP      Pulse      Resp      Temp      Temp src      SpO2      Weight      Height      Head Circumference      Peak Flow      Pain Score      Pain Loc      Pain Edu?      Excl. in [place]?    No data found.  Updated Vital Signs BP (!) 157/71 (BP Location: Left Arm)   Pulse 71   Temp 98.6 F (37 C) (Oral)   Resp 20   SpO2 94%   Visual Acuity Right Eye Distance:   Left Eye Distance:   Bilateral Distance:    Right Eye Near:   Left Eye Near:    Bilateral  Near:     Physical Exam Vitals and nursing note reviewed.  Constitutional:      General: She is not in acute distress.    Appearance: She is well-developed.  HENT:     Head: Normocephalic and atraumatic.     Mouth/Throat:     Mouth: Mucous membranes are moist.  Eyes:     Conjunctiva/sclera: Conjunctivae normal.  Cardiovascular:     Rate and Rhythm: Normal rate and regular rhythm.     Heart sounds: Normal heart sounds.  Pulmonary:     Effort: Pulmonary effort is normal. No respiratory distress.     Breath sounds: Normal breath sounds.  Abdominal:     Palpations: Abdomen is soft.     Tenderness: There is no abdominal tenderness. There is no guarding or rebound.  Musculoskeletal:        General: Tenderness present. No swelling, deformity or signs of injury. Normal range of motion.     Cervical back: Neck supple.       Legs:     Comments: Left groin and upper thigh mildly tender to palpation.  No palpable masses or lymphadenopathy.  No rash, wounds, erythema, ecchymosis.  Skin:    General: Skin is warm and dry.  Findings: No bruising, erythema, lesion or rash.  Neurological:     Mental Status: She is alert. Mental status is at baseline.  Psychiatric:        Mood and Affect: Mood normal.        Behavior: Behavior normal.     UC Treatments / Results  Labs (all labs ordered are listed, but only abnormal results are displayed) Labs Reviewed - No data to display  EKG   Radiology No results found.  Procedures Procedures (including critical care time)  Medications Ordered in UC Medications - No data to display  Initial Impression / Assessment and Plan / UC Course  I have reviewed the triage vital signs and the nursing notes.  Pertinent labs & imaging results that were available during my care of the patient were reviewed by me and considered in my medical decision making (see chart for details).  Left groin pain, Elevated blood pressure reading with known  hypertension.  No indication of infection or injury in groin.  Instructed patient to continue her current medications and add Tylenol if needed for discomfort.  Discussed topical Biofreeze; precautions given to patient and her caregiver to only apply this to intact skin over muscles.  Instructed her to follow-up with her PCP if her symptoms are not improving.  Also discussed that her blood pressure is elevated today and needs to be rechecked by her PCP in 2 to 4 weeks.  Education provided on managing hypertension.  Patient and her caregiver agree to plan of care.   Final Clinical Impressions(s) / UC Diagnoses   Final diagnoses:  Left groin pain  Elevated blood pressure reading in office with diagnosis of hypertension     Discharge Instructions      Take Tylenol as needed for discomfort.  Apply topical muscle pain relievers such as Biofreeze.  Follow up with your primary care provider if your symptoms are not improving.    Your blood pressure is elevated today at 157/71.  Please have this rechecked by your primary care provider in 2-4 weeks.          ED Prescriptions   None    PDMP not reviewed this encounter.   Sharion Balloon, NP 02/20/21 [id number]

## 2021-02-20 NOTE — Discharge Instructions (Addendum)
Take Tylenol as needed for discomfort.  Apply topical muscle pain relievers such as Biofreeze.  Follow up with your primary care provider if your symptoms are not improving.    Your blood pressure is elevated today at 157/71.  Please have this rechecked by your primary care provider in 2-4 weeks.

## 2021-02-20 NOTE — ED Triage Notes (Signed)
Pt presents today with c/o pain to left groin x 2 weeks. Denies injury.

## 2021-02-22 DIAGNOSIS — R339 Retention of urine, unspecified: Secondary | ICD-10-CM | POA: Diagnosis not present

## 2021-02-26 DIAGNOSIS — M519 Unspecified thoracic, thoracolumbar and lumbosacral intervertebral disc disorder: Secondary | ICD-10-CM | POA: Diagnosis not present

## 2021-02-26 DIAGNOSIS — M48061 Spinal stenosis, lumbar region without neurogenic claudication: Secondary | ICD-10-CM | POA: Diagnosis not present

## 2021-02-26 DIAGNOSIS — S76312A Strain of muscle, fascia and tendon of the posterior muscle group at thigh level, left thigh, initial encounter: Secondary | ICD-10-CM | POA: Diagnosis not present

## 2021-02-26 DIAGNOSIS — M19011 Primary osteoarthritis, right shoulder: Secondary | ICD-10-CM | POA: Diagnosis not present

## 2021-02-26 DIAGNOSIS — M19012 Primary osteoarthritis, left shoulder: Secondary | ICD-10-CM | POA: Diagnosis not present

## 2021-02-26 DIAGNOSIS — Q675 Congenital deformity of spine: Secondary | ICD-10-CM | POA: Diagnosis not present

## 2021-02-26 DIAGNOSIS — K5903 Drug induced constipation: Secondary | ICD-10-CM | POA: Diagnosis not present

## 2021-02-26 DIAGNOSIS — Z79899 Other long term (current) drug therapy: Secondary | ICD-10-CM | POA: Diagnosis not present

## 2021-02-26 DIAGNOSIS — M5416 Radiculopathy, lumbar region: Secondary | ICD-10-CM | POA: Diagnosis not present

## 2021-02-27 ENCOUNTER — Other Ambulatory Visit: Payer: PPO

## 2021-03-02 ENCOUNTER — Encounter: Payer: Self-pay | Admitting: Internal Medicine

## 2021-03-02 ENCOUNTER — Other Ambulatory Visit: Payer: Self-pay

## 2021-03-02 ENCOUNTER — Ambulatory Visit (INDEPENDENT_AMBULATORY_CARE_PROVIDER_SITE_OTHER): Payer: PPO | Admitting: Internal Medicine

## 2021-03-02 DIAGNOSIS — I872 Venous insufficiency (chronic) (peripheral): Secondary | ICD-10-CM | POA: Diagnosis not present

## 2021-03-02 DIAGNOSIS — F439 Reaction to severe stress, unspecified: Secondary | ICD-10-CM

## 2021-03-02 DIAGNOSIS — I272 Pulmonary hypertension, unspecified: Secondary | ICD-10-CM | POA: Diagnosis not present

## 2021-03-02 DIAGNOSIS — L989 Disorder of the skin and subcutaneous tissue, unspecified: Secondary | ICD-10-CM

## 2021-03-02 DIAGNOSIS — I700 Atherosclerosis of aorta: Secondary | ICD-10-CM | POA: Diagnosis not present

## 2021-03-02 DIAGNOSIS — M542 Cervicalgia: Secondary | ICD-10-CM

## 2021-03-02 DIAGNOSIS — I890 Lymphedema, not elsewhere classified: Secondary | ICD-10-CM | POA: Diagnosis not present

## 2021-03-02 DIAGNOSIS — D649 Anemia, unspecified: Secondary | ICD-10-CM

## 2021-03-02 DIAGNOSIS — E033 Postinfectious hypothyroidism: Secondary | ICD-10-CM

## 2021-03-02 DIAGNOSIS — K219 Gastro-esophageal reflux disease without esophagitis: Secondary | ICD-10-CM | POA: Diagnosis not present

## 2021-03-02 DIAGNOSIS — J9611 Chronic respiratory failure with hypoxia: Secondary | ICD-10-CM | POA: Diagnosis not present

## 2021-03-02 DIAGNOSIS — E785 Hyperlipidemia, unspecified: Secondary | ICD-10-CM | POA: Diagnosis not present

## 2021-03-02 DIAGNOSIS — I10 Essential (primary) hypertension: Secondary | ICD-10-CM | POA: Diagnosis not present

## 2021-03-02 DIAGNOSIS — I509 Heart failure, unspecified: Secondary | ICD-10-CM | POA: Diagnosis not present

## 2021-03-02 DIAGNOSIS — R21 Rash and other nonspecific skin eruption: Secondary | ICD-10-CM

## 2021-03-02 MED ORDER — MUPIROCIN 2 % EX OINT: 1.0000 "application " | TOPICAL_OINTMENT | Freq: Two times a day (BID) | CUTANEOUS | 0 refills | Status: DC

## 2021-03-02 MED ORDER — TRIAMCINOLONE ACETONIDE 0.1 % EX OINT: TOPICAL_OINTMENT | CUTANEOUS | 0 refills | Status: DC

## 2021-03-02 NOTE — Progress Notes (Signed)
Patient ID: Kimberly Roy, female   DOB: [birth year], 87 y.o.   MRN: [id number]   Subjective:    Patient ID: Kimberly Roy, female    DOB: April 28, 1931, 86 y.o.   MRN: [id number]  HPI This visit occurred during the SARS-CoV-2 public health emergency.  Safety protocols were in place, including screening questions prior to the visit, additional usage of staff PPE, and extensive cleaning of exam room while observing appropriate contact time as indicated for disinfecting solutions.   Patient here for a scheduled follow up.  Here to follow up regarding her blood pressure. Brought in outside readings.  Reviewed.  Most readings ranging between 120s to 130s over 50s to 60s.  No chest pain reported.  Overall breathing appears to be stable.  Recently had an episode where she did not feel well.  This lasted for several days.  During that time had some bladder pain.  States this has resolved.  She feels better now.  No nausea or vomiting.  Eating.  Abdomen is currently doing well.  No increased pain.  Went to urgent care for groin pain.  This is resolved.  She has had a rash on her left knee.  She has been applying triamcinolone cream.  It is improving.  Also notes pain in her right shoulder and then extending up her neck and into her right ear.  No rash.  She is planning to see orthopedics Tuesday for her shoulder.  No headache reported.  Discussed some notice of weakness.  Little hard for her to do things.  We discussed occupational therapy and physical therapy.  She wants to hold at this point.  Will notify me if she changes her mind.  Saw cardiology in June.  Stable.  Recommended follow-up in 4 months.   Past Medical History:  Diagnosis Date   Anemia    Anxiety    Chest pain    CHF (congestive heart failure) (HCC)    Constipation    DDD (degenerative disc disease), cervical    Depression    DVT (deep venous thrombosis) (HCC)    Dysphonia    Dyspnea    Fatty liver    Fatty liver    Headache    Hyperlipidemia     Hyperpiesia    Hypertension    Hypothyroidism    Interstitial cystitis    Left ventricular dysfunction    Lymphedema    Nephrolithiasis    Obstructive sleep apnea    Osteoarthritis    knees/cervical and lumbar spine   Pulmonary hypertension (HCC)    Pulmonary nodules    followed by Dr Raul Del   Pure hypercholesterolemia    Renal cyst    right   Past Surgical History:  Procedure Laterality Date   ABDOMINAL HYSTERECTOMY     ovaries left in place   APPENDECTOMY     Back Surgeries     BACK SURGERY     BREAST REDUCTION SURGERY     3/99   CARDIAC CATHETERIZATION     cataracts Bilateral    CERVICAL SPINE SURGERY     ESOPHAGEAL MANOMETRY N/A 08/02/2015   Procedure: ESOPHAGEAL MANOMETRY (EM);  Surgeon: Josefine Class, MD;  Location: [facility] ENDOSCOPY;  Service: Endoscopy;  Laterality: N/A;   ESOPHAGOGASTRODUODENOSCOPY N/A 02/27/2015   Procedure: ESOPHAGOGASTRODUODENOSCOPY (EGD);  Surgeon: Hulen Luster, MD;  Location: [facility] ENDOSCOPY;  Service: Gastroenterology;  Laterality: N/A;   ESOPHAGOGASTRODUODENOSCOPY (EGD) WITH PROPOFOL N/A 10/30/2018   Procedure: ESOPHAGOGASTRODUODENOSCOPY (EGD) WITH PROPOFOL;  Surgeon:  Lollie Sails, MD;  Location: [facility] ENDOSCOPY;  Service: Endoscopy;  Laterality: N/A;   EXCISIONAL HEMORRHOIDECTOMY     EYE SURGERY     FRACTURE SURGERY     HEMORRHOID SURGERY     HIP SURGERY  2013   Right hip surgery   JOINT REPLACEMENT     KNEE ARTHROSCOPY     left and right   ORIF FEMUR FRACTURE Right 08/07/2018   Procedure: OPEN REDUCTION INTERNAL FIXATION (ORIF) DISTAL FEMUR FRACTURE;  Surgeon: Hessie Knows, MD;  Location: [facility] ORS;  Service: Orthopedics;  Laterality: Right;   REDUCTION MAMMAPLASTY Bilateral YRS AGO   REPLACEMENT TOTAL KNEE Bilateral    rotator cuff surgery     blilateral   TONSILECTOMY/ADENOIDECTOMY WITH MYRINGOTOMY     VISCERAL ARTERY INTERVENTION N/A 02/25/2019   Procedure: VISCERAL ARTERY INTERVENTION;  Surgeon: Algernon Huxley, MD;   Location: [facility];  Service: Cardiovascular;  Laterality: N/A;   Family History  Problem Relation Age of Onset   Heart disease Mother    Stroke Mother    Hypertension Mother    Heart disease Father        myocardial infarction age 30   Breast cancer Neg Hx    Social History   Socioeconomic History   Marital status: Widowed    Spouse name: Not on file   Number of children: 2   Years of education: Not on file   Highest education level: Not on file  Occupational History    Comment: insurance agency  Tobacco Use   Smoking status: Never   Smokeless tobacco: Never  Vaping Use   Vaping Use: Never used  Substance and Sexual Activity   Alcohol use: No    Alcohol/week: 0.0 standard drinks   Drug use: Never   Sexual activity: Not Currently  Other Topics Concern   Not on file  Social History Narrative   Not on file   Social Determinants of Health   Financial Resource Strain: Low Risk    Difficulty of Paying Living Expenses: Not hard at all  Food Insecurity: No Food Insecurity   Worried About Charity fundraiser in the Last Year: Never true   [place] in the Last Year: Never true  Transportation Needs: Unmet Transportation Needs   Lack of Transportation (Medical): Yes   Lack of Transportation (Non-Medical): No  Physical Activity: Not on file  Stress: No Stress Concern Present   Feeling of Stress : Not at all  Social Connections: Unknown   Frequency of Communication with Friends and Family: Not on file   Frequency of Social Gatherings with Friends and Family: Twice a week   Attends Religious Services: Not on Electrical engineer or Organizations: Not on file   Attends Archivist Meetings: Not on file   Marital Status: Widowed    Review of Systems  Constitutional:  Negative for appetite change and unexpected weight change.  HENT:  Negative for congestion and sinus pressure.   Respiratory:  Negative for cough, chest tightness and  shortness of breath.   Cardiovascular:  Negative for chest pain and palpitations.       Wearing compression hose.  Swelling improved.    Gastrointestinal:  Negative for abdominal pain, diarrhea, nausea and vomiting.  Genitourinary:  Negative for difficulty urinating and dysuria.  Musculoskeletal:  Negative for joint swelling and myalgias.  Skin:  Negative for color change and rash.  Neurological:  Negative for dizziness,  light-headedness and headaches.  Psychiatric/Behavioral:  Negative for agitation and dysphoric mood.       Objective:    Physical Exam Vitals reviewed.  Constitutional:      General: She is not in acute distress.    Appearance: Normal appearance.  HENT:     Head: Normocephalic and atraumatic.     Right Ear: External ear normal.     Left Ear: External ear normal.  Eyes:     General: No scleral icterus.       Right eye: No discharge.        Left eye: No discharge.     Conjunctiva/sclera: Conjunctivae normal.  Neck:     Thyroid: No thyromegaly.  Cardiovascular:     Rate and Rhythm: Normal rate and regular rhythm.  Pulmonary:     Effort: No respiratory distress.     Breath sounds: Normal breath sounds. No wheezing.  Abdominal:     General: Bowel sounds are normal.     Palpations: Abdomen is soft.     Tenderness: There is no abdominal tenderness.  Musculoskeletal:        General: No swelling or tenderness.     Cervical back: Neck supple. No tenderness.  Lymphadenopathy:     Cervical: No cervical adenopathy.  Skin:    Findings: No erythema.     Comments: Rash - left knee - lesions healing.   Neurological:     Mental Status: She is alert.  Psychiatric:        Mood and Affect: Mood normal.        Behavior: Behavior normal.    BP 130/62   Pulse 60   Ht 5' 2.01" (1.575 m)   Wt 181 lb (82.1 kg)   SpO2 94%   BMI 33.10 kg/m  Wt Readings from Last 3 Encounters:  03/02/21 181 lb (82.1 kg)  01/30/21 182 lb (82.6 kg)  01/24/21 182 lb (82.6 kg)     Outpatient Encounter Medications as of 03/02/2021  Medication Sig   albuterol (VENTOLIN HFA) 108 (90 Base) MCG/ACT inhaler INHALE 2 PUFFS INTO THE LUNGS EVERY 6 (SIX) HOURS AS NEEDED FOR WHEEZING OR SHORTNESS OF BREATH   aluminum-magnesium hydroxide-simethicone (MAALOX) 080-223-36 MG/5ML SUSP Take 15 mLs by mouth 4 (four) times daily -  before meals and at bedtime.    amLODipine (NORVASC) 10 MG tablet TAKE 1 TABLET BY MOUTH DAILY   aspirin 81 MG EC tablet Take by mouth.   atorvastatin (LIPITOR) 20 MG tablet Take 1 tablet (20 mg total) by mouth at bedtime.   Azelastine HCl 137 MCG/SPRAY SOLN PLACE 1 SPRAY INTO BOTH NOSTRILS TWO TIMES DAILY   benazepril (LOTENSIN) 40 MG tablet TAKE 1 TABLET BY MOUTH DAILY   betamethasone dipropionate 0.05 % cream Apply 1 application topically 2 (two) times daily.   budesonide-formoterol (SYMBICORT) 80-4.5 MCG/ACT inhaler Inhale 2 puffs into the lungs 2 (two) times daily.   busPIRone (BUSPAR) 10 MG tablet Take 10 mg by mouth 2 (two) times daily.    calcium citrate-vitamin D (CITRACAL+D) 315-200 MG-UNIT tablet Take 1 tablet by mouth daily.   cephALEXin (KEFLEX) 250 MG capsule Take 250 mg by mouth daily.   Cholecalciferol (VITAMIN D3) 1000 UNITS CAPS Take 1 capsule by mouth daily.   Diclofenac Sodium 3 % GEL Apply topically 2 (two) times daily.   Docusate Sodium (DSS) 100 MG CAPS Take by mouth.   DULoxetine (CYMBALTA) 60 MG capsule TAKE 1 CAPSULE BY MOUTH EVERY DAY.   fluocinonide (LIDEX)  0.05 % external solution Apply topically.   furosemide (LASIX) 20 MG tablet Take 1 tablet (20 mg total) by mouth daily as needed.   gabapentin (NEURONTIN) 300 MG capsule TAKE 2 CAPSULES BY MOUTH 3 TIMES A DAY.   HYDROcodone-acetaminophen (NORCO) 10-325 MG tablet Take 1 tablet by mouth 3 (three) times daily.   ketoconazole (NIZORAL) 2 % shampoo Apply 1 application topically 2 (two) times a week.   magnesium oxide (MAG-OX) 400 MG tablet Take 1 tablet (400 mg total) by mouth  daily.   Melatonin 10 MG CAPS Take 1 capsule by mouth.   metoprolol succinate (TOPROL-XL) 50 MG 24 hr tablet Take 1 tablet (50 mg total) by mouth daily. Take with or immediately following a meal.   montelukast (SINGULAIR) 10 MG tablet 10 mg once daily as needed   morphine (MS CONTIN) 60 MG 12 hr tablet Take 1 tablet (60 mg total) by mouth 2 (two) times daily.   Naldemedine Tosylate 0.2 MG TABS Take 0.2 mg by mouth daily.   polyethylene glycol powder (GLYCOLAX/MIRALAX) 17 GM/SCOOP powder MIX 17 GRAMS AS MARKED ON BOTTLE TOP IN 8 OUNCES OF WATER AND DRINK ONCE A DAY AS DIRECTED.   RABEprazole (ACIPHEX) 20 MG tablet TAKE 1 TABLET BY MOUTH TWICE A DAY BEFORE A MEAL.   senna (SENOKOT) 8.6 MG TABS tablet Take 1 tablet by mouth 2 (two) times daily.   Simethicone (GAS-X PO) Take 2 tablets by mouth 2 (two) times daily.   sodium chloride (OCEAN) 0.65 % nasal spray Place 1 spray into the nose as needed.   SYNTHROID 100 MCG tablet Take 1 tablet (100 mcg total) by mouth daily.   traZODone (DESYREL) 50 MG tablet Take 25-50 mg by mouth at bedtime as needed.   [DISCONTINUED] mupirocin ointment (BACTROBAN) 2 % Apply 1 application topically 2 (two) times daily.   [DISCONTINUED] triamcinolone ointment (KENALOG) 0.1 % SMARTSIG:1 Application Topical 2-3 Times Daily   mupirocin ointment (BACTROBAN) 2 % Apply 1 application topically 2 (two) times daily.   triamcinolone ointment (KENALOG) 0.1 % SMARTSIG:1 Application Topical 2x/day   [DISCONTINUED] fluticasone (FLONASE) 50 MCG/ACT nasal spray Place 2 sprays into both nostrils daily. (Patient not taking: No sig reported)   [DISCONTINUED] hydrochlorothiazide (MICROZIDE) 12.5 MG capsule Take 12.5 mg by mouth daily. (Patient not taking: No sig reported)   No facility-administered encounter medications on file as of 03/02/2021.     Lab Results  Component Value Date   WBC 7.4 01/24/2021   HGB 10.8 (L) 01/24/2021   HCT 33.2 (L) 01/24/2021   PLT 177 01/24/2021   GLUCOSE  96 01/24/2021   CHOL 136 10/31/2020   TRIG 101.0 10/31/2020   HDL 51.10 10/31/2020   LDLCALC 65 10/31/2020   ALT 13 01/24/2021   AST 20 01/24/2021   NA 139 01/24/2021   K 4.2 01/24/2021   CL 98 01/24/2021   CREATININE 0.84 01/24/2021   BUN 18 01/24/2021   CO2 31 01/24/2021   TSH 1.17 08/11/2020   INR 0.89 08/06/2018   HGBA1C 6.0 10/19/2014       Assessment & Plan:   Problem List Items Addressed This Visit     Anemia    Has been followed by hematology.  Follow cbc.        Aortic atherosclerosis (HCC)    Continue lipitor.        CHF (congestive heart failure) (HCC)    On lasix.  Lower extremity swelling improved.  No evidence of volume overload.  Follow.        Chronic respiratory failure with hypoxia (HCC)    Uses oxygen at night.  Breathing at baseline.        Chronic venous insufficiency    Continue compression hose.        GERD (gastroesophageal reflux disease)    No upper symptoms reported.  On aciphex.         Hyperlipidemia    On lipitor.  Low cholesterol diet and exercise.  Follow lipid panel and liver function tests.         Hypertension    Continue amlodipine, lasix and metoprolol.  Follow pressures.  Follow metabolic panel. Recheck blood pressure improved.        Hypothyroidism    On thyroid replacement.  Follow tsh.        Lymphedema    Continues compression hose.  Swelling improved.         Neck pain    Increased right shoulder pain and neck pain.  Ear exam - ok.  She is seeing ortho in several days. Plans to discuss.  Follow.         Pulmonary hypertension ([place])    Has been followed by pulmonary.        Rash    Rash left knee.  Improved.  Continue triamcinolone cream.  Follow.         Skin lesion    Buttock.  Healing.  Avoid pressure as much as possible.  Bactroban.  Follow.        Stress    Followed by psychiatry.  Appears to be stable.  Follow.          Einar Pheasant, MD

## 2021-03-03 ENCOUNTER — Encounter: Payer: Self-pay | Admitting: Internal Medicine

## 2021-03-03 DIAGNOSIS — L989 Disorder of the skin and subcutaneous tissue, unspecified: Secondary | ICD-10-CM | POA: Insufficient documentation

## 2021-03-03 DIAGNOSIS — R21 Rash and other nonspecific skin eruption: Secondary | ICD-10-CM | POA: Insufficient documentation

## 2021-03-03 NOTE — Assessment & Plan Note (Signed)
Continue lipitor.

## 2021-03-03 NOTE — Assessment & Plan Note (Signed)
Rash left knee.  Improved.  Continue triamcinolone cream.  Follow.

## 2021-03-03 NOTE — Assessment & Plan Note (Signed)
Buttock.  Healing.  Avoid pressure as much as possible.  Bactroban.  Follow.

## 2021-03-03 NOTE — Assessment & Plan Note (Signed)
On thyroid replacement.  Follow tsh.

## 2021-03-03 NOTE — Assessment & Plan Note (Signed)
Uses oxygen at night.  Breathing at baseline.

## 2021-03-03 NOTE — Assessment & Plan Note (Signed)
On lipitor.  Low cholesterol diet and exercise.  Follow lipid panel and liver function tests.   

## 2021-03-03 NOTE — Assessment & Plan Note (Signed)
Continues compression hose.  Swelling improved.

## 2021-03-03 NOTE — Assessment & Plan Note (Signed)
Has been followed by hematology.  Follow cbc.  

## 2021-03-03 NOTE — Assessment & Plan Note (Signed)
Continue compression hose.

## 2021-03-03 NOTE — Assessment & Plan Note (Signed)
No upper symptoms reported.  On aciphex.

## 2021-03-03 NOTE — Assessment & Plan Note (Signed)
Increased right shoulder pain and neck pain.  Ear exam - ok.  She is seeing ortho in several days. Plans to discuss.  Follow.

## 2021-03-03 NOTE — Assessment & Plan Note (Signed)
On lasix.  Lower extremity swelling improved.  No evidence of volume overload.  Follow.

## 2021-03-03 NOTE — Assessment & Plan Note (Signed)
Has been followed by pulmonary.   °

## 2021-03-03 NOTE — Assessment & Plan Note (Signed)
Followed by psychiatry.  Appears to be stable.  Follow.  °

## 2021-03-03 NOTE — Assessment & Plan Note (Signed)
Continue amlodipine, lasix and metoprolol.  Follow pressures.  Follow metabolic panel. Recheck blood pressure improved.

## 2021-03-06 DIAGNOSIS — M7061 Trochanteric bursitis, right hip: Secondary | ICD-10-CM | POA: Diagnosis not present

## 2021-03-08 ENCOUNTER — Other Ambulatory Visit: Payer: Self-pay

## 2021-03-08 ENCOUNTER — Ambulatory Visit
Admission: RE | Admit: 2021-03-08 | Discharge: 2021-03-08 | Disposition: A | Discharge status: Home / Self Care | Payer: PPO | Source: Ambulatory Visit | Attending: Internal Medicine | Admitting: Internal Medicine

## 2021-03-08 ENCOUNTER — Telehealth: Payer: PPO | Admitting: Physician Assistant

## 2021-03-08 ENCOUNTER — Encounter: Payer: Self-pay | Admitting: Physician Assistant

## 2021-03-08 DIAGNOSIS — M810 Age-related osteoporosis without current pathological fracture: Secondary | ICD-10-CM | POA: Diagnosis not present

## 2021-03-08 DIAGNOSIS — Z1231 Encounter for screening mammogram for malignant neoplasm of breast: Secondary | ICD-10-CM | POA: Diagnosis not present

## 2021-03-08 DIAGNOSIS — R3989 Other symptoms and signs involving the genitourinary system: Secondary | ICD-10-CM

## 2021-03-08 MED ORDER — PHENAZOPYRIDINE HCL 100 MG PO TABS
100.0000 mg | ORAL_TABLET | Freq: Three times a day (TID) | ORAL | 0 refills | Status: DC | PRN
Start: 2021-03-08 — End: 2021-03-28

## 2021-03-08 MED ORDER — CIPROFLOXACIN HCL 250 MG PO TABS: 250.0000 mg | ORAL_TABLET | Freq: Two times a day (BID) | ORAL | 0 refills | Status: DC

## 2021-03-08 NOTE — Patient Instructions (Signed)
Kimberly Roy, thank you for joining Mar Daring, PA-C for today's virtual visit.  While this provider is not your primary care provider (PCP), if your PCP is located in our provider database this encounter information will be shared with them immediately following your visit.  Consent: (Patient) Kimberly Roy provided verbal consent for this virtual visit at the beginning of the encounter.  Current Medications:  Current Outpatient Medications:    ciprofloxacin (CIPRO) 250 MG tablet, Take 1 tablet (250 mg total) by mouth 2 (two) times daily., Disp: 14 tablet, Rfl: 0   phenazopyridine (PYRIDIUM) 100 MG tablet, Take 1 tablet (100 mg total) by mouth 3 (three) times daily as needed for pain., Disp: 10 tablet, Rfl: 0   albuterol (VENTOLIN HFA) 108 (90 Base) MCG/ACT inhaler, INHALE 2 PUFFS INTO THE LUNGS EVERY 6 (SIX) HOURS AS NEEDED FOR WHEEZING OR SHORTNESS OF BREATH, Disp: 18 g, Rfl: 0   aluminum-magnesium hydroxide-simethicone (MAALOX) 810-175-10 MG/5ML SUSP, Take 15 mLs by mouth 4 (four) times daily -  before meals and at bedtime. , Disp: , Rfl:    amLODipine (NORVASC) 10 MG tablet, TAKE 1 TABLET BY MOUTH DAILY, Disp: 90 tablet, Rfl: 1   aspirin 81 MG EC tablet, Take by mouth., Disp: , Rfl:    atorvastatin (LIPITOR) 20 MG tablet, Take 1 tablet (20 mg total) by mouth at bedtime., Disp: 90 tablet, Rfl: 1   Azelastine HCl 137 MCG/SPRAY SOLN, PLACE 1 SPRAY INTO BOTH NOSTRILS TWO TIMES DAILY, Disp: 30 mL, Rfl: 1   benazepril (LOTENSIN) 40 MG tablet, TAKE 1 TABLET BY MOUTH DAILY, Disp: 90 tablet, Rfl: 1   betamethasone dipropionate 0.05 % cream, Apply 1 application topically 2 (two) times daily., Disp: , Rfl:    budesonide-formoterol (SYMBICORT) 80-4.5 MCG/ACT inhaler, Inhale 2 puffs into the lungs 2 (two) times daily., Disp: 1 each, Rfl: 12   busPIRone (BUSPAR) 10 MG tablet, Take 10 mg by mouth 2 (two) times daily. , Disp: , Rfl:    calcium citrate-vitamin D (CITRACAL+D) 315-200 MG-UNIT tablet,  Take 1 tablet by mouth daily., Disp: , Rfl:    cephALEXin (KEFLEX) 250 MG capsule, Take 250 mg by mouth daily., Disp: , Rfl:    Cholecalciferol (VITAMIN D3) 1000 UNITS CAPS, Take 1 capsule by mouth daily., Disp: , Rfl:    Diclofenac Sodium 3 % GEL, Apply topically 2 (two) times daily., Disp: , Rfl:    Docusate Sodium (DSS) 100 MG CAPS, Take by mouth., Disp: , Rfl:    DULoxetine (CYMBALTA) 60 MG capsule, TAKE 1 CAPSULE BY MOUTH EVERY DAY., Disp: 30 capsule, Rfl: 3   fluocinonide (LIDEX) 0.05 % external solution, Apply topically., Disp: , Rfl:    furosemide (LASIX) 20 MG tablet, Take 1 tablet (20 mg total) by mouth daily as needed., Disp: 90 tablet, Rfl: 0   gabapentin (NEURONTIN) 300 MG capsule, TAKE 2 CAPSULES BY MOUTH 3 TIMES A DAY., Disp: 540 capsule, Rfl: 2   HYDROcodone-acetaminophen (NORCO) 10-325 MG tablet, Take 1 tablet by mouth 3 (three) times daily., Disp: 10 tablet, Rfl: 0   ketoconazole (NIZORAL) 2 % shampoo, Apply 1 application topically 2 (two) times a week., Disp: , Rfl:    magnesium oxide (MAG-OX) 400 MG tablet, Take 1 tablet (400 mg total) by mouth daily., Disp: 30 tablet, Rfl: 1   Melatonin 10 MG CAPS, Take 1 capsule by mouth., Disp: , Rfl:    metoprolol succinate (TOPROL-XL) 50 MG 24 hr tablet, Take 1 tablet (50 mg  total) by mouth daily. Take with or immediately following a meal., Disp: 90 tablet, Rfl: 1   montelukast (SINGULAIR) 10 MG tablet, 10 mg once daily as needed, Disp: , Rfl:    morphine (MS CONTIN) 60 MG 12 hr tablet, Take 1 tablet (60 mg total) by mouth 2 (two) times daily., Disp: 10 tablet, Rfl: 0   mupirocin ointment (BACTROBAN) 2 %, Apply 1 application topically 2 (two) times daily., Disp: 22 g, Rfl: 0   Naldemedine Tosylate 0.2 MG TABS, Take 0.2 mg by mouth daily., Disp: , Rfl:    polyethylene glycol powder (GLYCOLAX/MIRALAX) 17 GM/SCOOP powder, MIX 17 GRAMS AS MARKED ON BOTTLE TOP IN 8 OUNCES OF WATER AND DRINK ONCE A DAY AS DIRECTED., Disp: 527 g, Rfl: 0    RABEprazole (ACIPHEX) 20 MG tablet, TAKE 1 TABLET BY MOUTH TWICE A DAY BEFORE A MEAL., Disp: 180 tablet, Rfl: 1   senna (SENOKOT) 8.6 MG TABS tablet, Take 1 tablet by mouth 2 (two) times daily., Disp: , Rfl:    Simethicone (GAS-X PO), Take 2 tablets by mouth 2 (two) times daily., Disp: , Rfl:    sodium chloride (OCEAN) 0.65 % nasal spray, Place 1 spray into the nose as needed., Disp: , Rfl:    SYNTHROID 100 MCG tablet, Take 1 tablet (100 mcg total) by mouth daily., Disp: 90 tablet, Rfl: 3   traZODone (DESYREL) 50 MG tablet, Take 25-50 mg by mouth at bedtime as needed., Disp: , Rfl:    triamcinolone ointment (KENALOG) 0.1 %, SMARTSIG:1 Application Topical 2x/day, Disp: 30 g, Rfl: 0   Medications ordered in this encounter:  Meds ordered this encounter  Medications   ciprofloxacin (CIPRO) 250 MG tablet    Sig: Take 1 tablet (250 mg total) by mouth 2 (two) times daily.    Dispense:  14 tablet    Refill:  0    Order Specific Question:   Supervising Provider    Answer:   Sabra Heck, BRIAN [[id number]]   phenazopyridine (PYRIDIUM) 100 MG tablet    Sig: Take 1 tablet (100 mg total) by mouth 3 (three) times daily as needed for pain.    Dispense:  10 tablet    Refill:  0    Order Specific Question:   Supervising Provider    Answer:   Sabra Heck, BRIAN [[id number]]     *If you need refills on other medications prior to your next appointment, please contact your pharmacy*  Follow-Up: Call back or seek an in-person evaluation if the symptoms worsen or if the condition fails to improve as anticipated.  If you have been instructed to have an in-person evaluation today at a local Urgent Care facility, please use the link below. It will take you to a list of all of our available [facility], including address, phone number and hours of operation. Please do not delay care.  [facility]  If you or a family member do not have a primary care provider, use the link below to schedule a visit and  establish care. When you choose a [facility] primary care physician or advanced practice provider, you gain a long-term partner in health. Find a Primary Care Provider  Learn more about [facility] in-office and virtual care options: [facility]  Urinary Tract Infection, Adult A urinary tract infection (UTI) is an infection of any part of the urinary tract. The urinary tract includes: The kidneys. The ureters. The bladder. The urethra. These organs  make, store, and get rid of pee (urine) in the body. What are the causes? This infection is caused by germs (bacteria) in your genital area. These germs grow and cause swelling (inflammation) of your urinary tract. What increases the risk? The following factors may make you more likely to develop this condition: Using a small, thin tube (catheter) to drain pee. Not being able to control when you pee or poop (incontinence). Being female. If you are female, these things can increase the risk: Using these methods to prevent pregnancy: A medicine that kills sperm (spermicide). A device that blocks sperm (diaphragm). Having low levels of a female hormone (estrogen). Being pregnant. You are more likely to develop this condition if: You have genes that add to your risk. You are sexually active. You take antibiotic medicines. You have trouble peeing because of: A prostate that is bigger than normal, if you are female. A blockage in the part of your body that drains pee from the bladder. A kidney stone. A nerve condition that affects your bladder. Not getting enough to drink. Not peeing often enough. You have other conditions, such as: Diabetes. A weak disease-fighting system (immune system). Sickle cell disease. Gout. Injury of the spine. What are the signs or symptoms? Symptoms of this condition include: Needing to pee right away. Peeing small amounts often. Pain or burning when peeing. Blood in the pee. Pee that  smells bad or not like normal. Trouble peeing. Pee that is cloudy. Fluid coming from the vagina, if you are female. Pain in the belly or lower back. Other symptoms include: Vomiting. Not feeling hungry. Feeling mixed up (confused). This may be the first symptom in older adults. Being tired and grouchy (irritable). A fever. Watery poop (diarrhea). How is this treated? Taking antibiotic medicine. Taking other medicines. Drinking enough water. In some cases, you may need to see a specialist. Follow these instructions at home:  Medicines Take over-the-counter and prescription medicines only as told by your doctor. If you were prescribed an antibiotic medicine, take it as told by your doctor. Do not stop taking it even if you start to feel better. General instructions Make sure you: Pee until your bladder is empty. Do not hold pee for a long time. Empty your bladder after sex. Wipe from front to back after peeing or pooping if you are a female. Use each tissue one time when you wipe. Drink enough fluid to keep your pee pale yellow. Keep all follow-up visits. Contact a doctor if: You do not get better after 1-2 days. Your symptoms go away and then come back. Get help right away if: You have very bad back pain. You have very bad pain in your lower belly. You have a fever. You have chills. You feeling like you will vomit or you vomit. Summary A urinary tract infection (UTI) is an infection of any part of the urinary tract. This condition is caused by germs in your genital area. There are many risk factors for a UTI. Treatment includes antibiotic medicines. Drink enough fluid to keep your pee pale yellow. This information is not intended to replace advice given to you by your health care provider. Make sure you discuss any questions you have with your healthcare provider. Document Revised: 03/31/2020 Document Reviewed: 03/31/2020 Elsevier Patient Education  [place].

## 2021-03-08 NOTE — Progress Notes (Signed)
Ms. makynzee, tigges are scheduled for a virtual visit with your provider today.    Just as we do with appointments in the office, we must obtain your consent to participate.  Your consent will be active for this visit and any virtual visit you may have with one of our providers in the next 365 days.    If you have a MyChart account, I can also send a copy of this consent to you electronically.  All virtual visits are billed to your insurance company just like a traditional visit in the office.  As this is a virtual visit, video technology does not allow for your provider to perform a traditional examination.  This may limit your provider's ability to fully assess your condition.  If your provider identifies any concerns that need to be evaluated in person or the need to arrange testing such as labs, EKG, etc, we will make arrangements to do so.    Although advances in technology are sophisticated, we cannot ensure that it will always work on either your end or our end.  If the connection with a video visit is poor, we may have to switch to a telephone visit.  With either a video or telephone visit, we are not always able to ensure that we have a secure connection.   I need to obtain your verbal consent now.   Are you willing to proceed with your visit today?   SYBRINA LANING has provided verbal consent on 03/08/2021 for a virtual visit (video or telephone).   Mar Daring, PA-C 03/08/2021  2:31 PM  Virtual Visit Consent   [name], you are scheduled for a virtual visit with a [facility] provider today.     Just as with appointments in the office, your consent must be obtained to participate.  Your consent will be active for this visit and any virtual visit you may have with one of our providers in the next 365 days.     If you have a MyChart account, a copy of this consent can be sent to you electronically.  All virtual visits are billed to your insurance company just like a traditional visit in  the office.    As this is a virtual visit, video technology does not allow for your provider to perform a traditional examination.  This may limit your provider's ability to fully assess your condition.  If your provider identifies any concerns that need to be evaluated in person or the need to arrange testing (such as labs, EKG, etc.), we will make arrangements to do so.     Although advances in technology are sophisticated, we cannot ensure that it will always work on either your end or our end.  If the connection with a video visit is poor, the visit may have to be switched to a telephone visit.  With either a video or telephone visit, we are not always able to ensure that we have a secure connection.     I need to obtain your verbal consent now.   Are you willing to proceed with your visit today?    SHARION GRIEVES has provided verbal consent on 03/08/2021 for a virtual visit (video or telephone).   Mar Daring, PA-C   Date: 03/08/2021 2:31 PM   Virtual Visit via Video Note   I, Mar Daring, connected with  SELEEN WALTER  ([id number], [birth year]) on 03/08/21 at  2:30 PM EDT by a video-enabled telemedicine application  and verified that I am speaking with the correct person using two identifiers.  Location: Patient: Virtual Visit Location Patient: Mobile, in car with family parked, verbal ok to proceed with them present Provider: Virtual Visit Location Provider: Home Office   I discussed the limitations of evaluation and management by telemedicine and the availability of in person appointments. The patient expressed understanding and agreed to proceed.    History of Present Illness: TYREESHA MAHARAJ is a 87 y.o. who identifies as a female who was assigned female at birth, and is being seen today for possible UTI.  HPI: Urinary Tract Infection  This is a new problem. The current episode started yesterday. The problem has been gradually worsening. The quality of the pain is described as  burning. The pain is moderate. There has been no fever. She is Not sexually active. There is No history of pyelonephritis. Associated symptoms include chills, frequency and urgency. Pertinent negatives include no flank pain, hematuria, hesitancy, nausea or vomiting. She has tried increased fluids, acetaminophen and antibiotics (AZO, on low dose keflex 214m daily for prophylaxis) for the symptoms. The treatment provided no relief. Her past medical history is significant for recurrent UTIs. Interstitial cystitis     Problems:  Patient Active Problem List   Diagnosis Date Noted   Rash 03/03/2021   Skin lesion 03/03/2021   Aortic atherosclerosis ([place] 11/20/2020   Bradycardia 06/08/2020   Itching 04/08/2020   Dyspnea 01/17/2020   Chronic respiratory failure with hypoxia ([facility] 01/17/2020   Right hip pain 01/10/2020   Leukocytosis 11/15/2019   Wound of buttock 10/25/2019   COVID-19 virus infection 10/04/2019   Urinary frequency 09/26/2019   Cold feeling 09/12/2019   Left knee pain 07/04/2019   Hemoptysis 06/30/2019   Femur fracture ([place] 08/06/2018   Primary osteoarthritis of right shoulder 08/15/2017   Right shoulder pain 05/16/2017   Chronic venous insufficiency 02/10/2017   Lymphedema 02/10/2017   Neuropathy 12/25/2016   Anemia due to blood loss, chronic 08/07/2016   GERD (gastroesophageal reflux disease) 12/10/2015   Finger pain 11/13/2015   Carotid artery calcification 09/26/2015   External nasal lesion 09/25/2015   Muscle cramps 09/01/2015   Excessive sweating 07/30/2015   Headache 07/16/2015   Groin pain 05/14/2015   Muscle twitching 03/05/2015   Leg cramps 02/11/2015   Back pain 02/11/2015   Abdominal pain 02/11/2015   Chronic pain 01/19/2015   Acute cystitis without hematuria 12/12/2014   Left elbow pain 11/30/2014   Health care maintenance 11/30/2014   Osteoporosis 10/19/2014   Rectal bleeding 10/19/2014   Neck pain 09/03/2014   Unsteady gait 09/03/2014   Nocturia  09/03/2014   Dysphagia 06/01/2014   Nasal dryness 06/01/2014   Stress 06/01/2014   Fatigue 02/27/2014   Degenerative disc disease 12/21/2013   Pre-op evaluation 10/12/2013   Hoarseness 08/14/2013   Leg swelling 01/24/2013   CHF (congestive heart failure) ([facility] 12/29/2012   Cough 12/29/2012   OSA (obstructive sleep apnea) 07/09/2012   Osteoarthritis 07/04/2012   Anemia 07/04/2012   Chronic constipation 07/04/2012   Pulmonary hypertension ([place] 07/04/2012   Pulmonary nodules 07/04/2012   Hypertension 07/04/2012   Hyperlipidemia 07/04/2012   Hypothyroidism 07/04/2012    Allergies:  Allergies  Allergen Reactions   Lyrica [Pregabalin] Swelling   Omnicef [Cefdinir] Diarrhea and Nausea And Vomiting   Atarax [Hydroxyzine]     jittery   Dicyclomine Other (See Comments)    Abdominal bloating    Hydroxyzine Hcl     jittery  Levaquin [Levofloxacin] Swelling   Nitrofurantoin Diarrhea   Nucynta Er [Tapentadol Hcl Er] Other (See Comments)    Severe constipation    Oxybutynin Other (See Comments)    Blurred vision   Zoloft [Sertraline Hcl]     Severe headache   Biaxin [Clarithromycin] Other (See Comments) and Rash    Pt does not remember Pt does not remember   Sertraline Nausea And Vomiting    Severe headache Severe headache Other reaction(s): Headache Severe headache   Sulfa Antibiotics Rash    Pt does not remember   Sulfasalazine Rash    Pt does not remember   Tape Rash    Durab[place] - redness   Tapentadol Other (See Comments) and Rash    _0    Medications:  Current Outpatient Medications:    ciprofloxacin (CIPRO) 250 MG tablet, Take 1 tablet (250 mg total) by mouth 2 (two) times daily., Disp: 14 tablet, Rfl: 0   phenazopyridine (PYRIDIUM) 100 MG tablet, Take 1 tablet (100 mg total) by mouth 3 (three) times daily as needed for pain., Disp: 10 tablet, Rfl: 0   albuterol  (VENTOLIN HFA) 108 (90 Base) MCG/ACT inhaler, INHALE 2 PUFFS INTO THE LUNGS EVERY 6 (SIX) HOURS AS NEEDED FOR WHEEZING OR SHORTNESS OF BREATH, Disp: 18 g, Rfl: 0   aluminum-magnesium hydroxide-simethicone (MAALOX) 784-784-12 MG/5ML SUSP, Take 15 mLs by mouth 4 (four) times daily -  before meals and at bedtime. , Disp: , Rfl:    amLODipine (NORVASC) 10 MG tablet, TAKE 1 TABLET BY MOUTH DAILY, Disp: 90 tablet, Rfl: 1   aspirin 81 MG EC tablet, Take by mouth., Disp: , Rfl:    atorvastatin (LIPITOR) 20 MG tablet, Take 1 tablet (20 mg total) by mouth at bedtime., Disp: 90 tablet, Rfl: 1   Azelastine HCl 137 MCG/SPRAY SOLN, PLACE 1 SPRAY INTO BOTH NOSTRILS TWO TIMES DAILY, Disp: 30 mL, Rfl: 1   benazepril (LOTENSIN) 40 MG tablet, TAKE 1 TABLET BY MOUTH DAILY, Disp: 90 tablet, Rfl: 1   betamethasone dipropionate 0.05 % cream, Apply 1 application topically 2 (two) times daily., Disp: , Rfl:    budesonide-formoterol (SYMBICORT) 80-4.5 MCG/ACT inhaler, Inhale 2 puffs into the lungs 2 (two) times daily., Disp: 1 each, Rfl: 12   busPIRone (BUSPAR) 10 MG tablet, Take 10 mg by mouth 2 (two) times daily. , Disp: , Rfl:    calcium citrate-vitamin D (CITRACAL+D) 315-200 MG-UNIT tablet, Take 1 tablet by mouth daily., Disp: , Rfl:    cephALEXin (KEFLEX) 250 MG capsule, Take 250 mg by mouth daily., Disp: , Rfl:    Cholecalciferol (VITAMIN D3) 1000 UNITS CAPS, Take 1 capsule by mouth daily., Disp: , Rfl:    Diclofenac Sodium 3 % GEL, Apply topically 2 (two) times daily., Disp: , Rfl:    Docusate Sodium (DSS) 100 MG CAPS, Take by mouth., Disp: , Rfl:    DULoxetine (CYMBALTA) 60 MG capsule, TAKE 1 CAPSULE BY MOUTH EVERY DAY., Disp: 30 capsule, Rfl: 3   fluocinonide (LIDEX) 0.05 % external solution, Apply topically., Disp: , Rfl:    furosemide (LASIX) 20 MG tablet, Take 1 tablet (20 mg total) by mouth daily as needed., Disp: 90 tablet, Rfl: 0   gabapentin (NEURONTIN) 300 MG capsule, TAKE 2 CAPSULES BY MOUTH 3 TIMES A  DAY., Disp: 540 capsule, Rfl: 2   HYDROcodone-acetaminophen (NORCO) 10-325 MG tablet, Take 1 tablet by mouth 3 (three) times daily., Disp: 10 tablet, Rfl: 0  ketoconazole (NIZORAL) 2 % shampoo, Apply 1 application topically 2 (two) times a week., Disp: , Rfl:    magnesium oxide (MAG-OX) 400 MG tablet, Take 1 tablet (400 mg total) by mouth daily., Disp: 30 tablet, Rfl: 1   Melatonin 10 MG CAPS, Take 1 capsule by mouth., Disp: , Rfl:    metoprolol succinate (TOPROL-XL) 50 MG 24 hr tablet, Take 1 tablet (50 mg total) by mouth daily. Take with or immediately following a meal., Disp: 90 tablet, Rfl: 1   montelukast (SINGULAIR) 10 MG tablet, 10 mg once daily as needed, Disp: , Rfl:    morphine (MS CONTIN) 60 MG 12 hr tablet, Take 1 tablet (60 mg total) by mouth 2 (two) times daily., Disp: 10 tablet, Rfl: 0   mupirocin ointment (BACTROBAN) 2 %, Apply 1 application topically 2 (two) times daily., Disp: 22 g, Rfl: 0   Naldemedine Tosylate 0.2 MG TABS, Take 0.2 mg by mouth daily., Disp: , Rfl:    polyethylene glycol powder (GLYCOLAX/MIRALAX) 17 GM/SCOOP powder, MIX 17 GRAMS AS MARKED ON BOTTLE TOP IN 8 OUNCES OF WATER AND DRINK ONCE A DAY AS DIRECTED., Disp: 527 g, Rfl: 0   RABEprazole (ACIPHEX) 20 MG tablet, TAKE 1 TABLET BY MOUTH TWICE A DAY BEFORE A MEAL., Disp: 180 tablet, Rfl: 1   senna (SENOKOT) 8.6 MG TABS tablet, Take 1 tablet by mouth 2 (two) times daily., Disp: , Rfl:    Simethicone (GAS-X PO), Take 2 tablets by mouth 2 (two) times daily., Disp: , Rfl:    sodium chloride (OCEAN) 0.65 % nasal spray, Place 1 spray into the nose as needed., Disp: , Rfl:    SYNTHROID 100 MCG tablet, Take 1 tablet (100 mcg total) by mouth daily., Disp: 90 tablet, Rfl: 3   traZODone (DESYREL) 50 MG tablet, Take 25-50 mg by mouth at bedtime as needed., Disp: , Rfl:    triamcinolone ointment (KENALOG) 0.1 %, SMARTSIG:1 Application Topical 2x/day, Disp: 30 g, Rfl: 0  Observations/Objective: Patient is well-developed,  well-nourished in no acute distress.  Resting comfortably in car with family Head is normocephalic, atraumatic.  No labored breathing. Speech is clear and coherent with logical content.  Patient is alert and oriented at baseline.    Assessment and Plan: 1. Suspected UTI - ciprofloxacin (CIPRO) 250 MG tablet; Take 1 tablet (250 mg total) by mouth 2 (two) times daily.  Dispense: 14 tablet; Refill: 0 - phenazopyridine (PYRIDIUM) 100 MG tablet; Take 1 tablet (100 mg total) by mouth 3 (three) times daily as needed for pain.  Dispense: 10 tablet; Refill: 0 - Worsening symptoms.  - Will treat empirically with Cipro (patient reports she tolerates and is already on low dose Keflex daily for prevention). - Pyridium given for spasm.  - Continue to push fluids.  - Seek in person evaluation if symptoms do not improve or if they worsen.    Follow Up Instructions: I discussed the assessment and treatment plan with the patient. The patient was provided an opportunity to ask questions and all were answered. The patient agreed with the plan and demonstrated an understanding of the instructions.  A copy of instructions were sent to the patient via MyChart.  The patient was advised to call back or seek an in-person evaluation if the symptoms worsen or if the condition fails to improve as anticipated.  Time:  I spent 12 minutes with the patient via telehealth technology discussing the above problems/concerns.    Mar Daring, PA-C

## 2021-03-09 ENCOUNTER — Ambulatory Visit (INDEPENDENT_AMBULATORY_CARE_PROVIDER_SITE_OTHER): Payer: PPO | Admitting: *Deleted

## 2021-03-09 ENCOUNTER — Ambulatory Visit: Payer: PPO | Admitting: *Deleted

## 2021-03-09 ENCOUNTER — Encounter: Payer: Self-pay | Admitting: Internal Medicine

## 2021-03-09 DIAGNOSIS — M1990 Unspecified osteoarthritis, unspecified site: Secondary | ICD-10-CM

## 2021-03-09 DIAGNOSIS — R5383 Other fatigue: Secondary | ICD-10-CM

## 2021-03-09 DIAGNOSIS — M810 Age-related osteoporosis without current pathological fracture: Secondary | ICD-10-CM | POA: Diagnosis not present

## 2021-03-09 DIAGNOSIS — F439 Reaction to severe stress, unspecified: Secondary | ICD-10-CM

## 2021-03-09 DIAGNOSIS — I10 Essential (primary) hypertension: Secondary | ICD-10-CM

## 2021-03-09 DIAGNOSIS — R2681 Unsteadiness on feet: Secondary | ICD-10-CM

## 2021-03-09 DIAGNOSIS — E785 Hyperlipidemia, unspecified: Secondary | ICD-10-CM | POA: Diagnosis not present

## 2021-03-09 DIAGNOSIS — R3989 Other symptoms and signs involving the genitourinary system: Secondary | ICD-10-CM

## 2021-03-09 DIAGNOSIS — I509 Heart failure, unspecified: Secondary | ICD-10-CM | POA: Diagnosis not present

## 2021-03-09 NOTE — Patient Instructions (Signed)
Visit Information  PATIENT GOALS:  Goals Addressed             This Visit's Progress    (RNCM) Cope with Chronic Pain   On track    Timeframe:  Long-Range Goal Priority:  Medium Start Date:   02/09/21                          Expected End Date: 08/01/21                      Follow Up Date 7/29//22   Learn relaxation techniques and use relaxation during pain  Practice acceptance of chronic pain Practice relaxation or meditation daily Tell myself I can (not I can't) Use distraction techniques Take medication as prescribed Use Ice and Heat to help with pain Complete antibiotics as prescribed and contact PCP if UTI symptoms not getting better or resolved   Why is this important?   Stress makes chronic pain feel worse.  Feelings like depression, anxiety, stress and anger can make your body more sensitive to pain.  Learning ways to cope with stress or depression may help you find some relief from the pain.     Notes:       (RNCM) Track and Manage My Blood Pressure-Hypertension   Not on track    Timeframe:  Long-Range Goal Priority:  Medium Start Date:   02/09/21                          Expected End Date: 08/01/21                      Follow Up Date  03/30/21    Check blood pressure weekly Write blood pressure results in a log and take log to appointments for provider review Review information sent by Care Guides for Respite Care/Adult Day Care resources for your son   Why is this important?   You won't feel high blood pressure, but it can still hurt your blood vessels.  High blood pressure can cause heart or kidney problems. It can also cause a stroke.  Making lifestyle changes like losing a little weight or eating less salt will help.  Checking your blood pressure at home and at different times of the day can help to control blood pressure.  If the doctor prescribes medicine remember to take it the way the doctor ordered.  Call the office if you cannot afford the  medicine or if there are questions about it.     Notes:          Patient verbalizes understanding of instructions provided today and agrees to view in [place].   The care management team will reach out to the patient again over the next 30 business days.   Hubert Azure RN, MSN RN Care Management Coordinator [place] [phone] [email or web address]

## 2021-03-09 NOTE — Patient Instructions (Signed)
Visit Information   PATIENT GOALS:   Goals Addressed               This Visit's Progress     Maintain My Quality of Life through [place]. (pt-stated)   On track     Timeframe:  Short-Term Goal Priority:  High Start Date:  03/09/2021                         Expected End Date:  05/10/2021               Follow-Up Date: 03/15/2021 at 11:30am.    Patient Goals/Self-Care Activities:  Follow-up with Rosealee Albee, Social Worker II with the [place] [facility], to check the status of your application process. Work with [employer] on a weekly basis until approved for [employer], or until in-home care services are in place through a private agency of choice. Review list of private agencies providing in-home care services, mailed to you by LCSW. Remember: Always use handrails on the stairs. Always use your cane or walker when ambulating. Always wear your glasses, especially at night or in areas not well lit.          Consent to CCM Services: Ms. Clover was given information about Chronic Care Management services today including:  CCM service includes personalized support from designated clinical staff supervised by her physician, including individualized plan of care and coordination with other care providers 24/7 contact phone numbers for assistance for urgent and routine care needs. Service will only be billed when office clinical staff spend 20 minutes or more in a month to coordinate care. Only one practitioner may furnish and bill the service in a calendar month. The patient may stop CCM services at any time (effective at the end of the month) by phone call to the office staff. The patient will be responsible for cost sharing (co-pay) of up to 20% of the service fee (after annual deductible is met).  Patient agreed to services and verbal consent obtained.   Patient verbalizes understanding of instructions provided today  and agrees to view in [place].   Telephone follow-up appointment with care management team member scheduled for:  03/15/2021 at 11:30am.  Nat Christen LCSW Licensed Clinical Social Worker [place]  [phone]   CLINICAL CARE PLAN: Patient Care Plan: Hypertension (Adult)     Problem Identified: Hypertension (Hypertension)   Priority: Medium     Long-Range Goal: Hypertension Monitored   Start Date: 02/09/2021  Expected End Date: 08/01/2021  This Visit's Progress: Not on track  Priority: Medium  Note:   Objective:  Last practice recorded BP readings:  BP Readings from Last 3 Encounters:  03/03/21 130/62  02/20/21 (!) 157/71  12/27/20 (!) 130/47  Current Barriers:  Knowledge Deficits related to basic understanding of hypertension pathophysiology and self care management; patient reports she does not monitor blood pressures often at home.  Does state that when she does it is typically normal.  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  Continues to report not monitoring blood pressures, due to not feeling well the past few weeks.  Discussed importance of monitoring blood pressure.  Reports she has received email list of Respite Care and Adult Day Care options Case Manager Clinical Goal(s):  patient will verbalize understanding of plan for hypertension management patient will demonstrate improved adherence to prescribed treatment plan for  hypertension as evidenced by taking all medications as prescribed, monitoring and recording blood pressure as directed, adhering to low sodium/DASH diet Interventions:  Collaboration with Einar Pheasant, MD regarding development and update of comprehensive plan of care as evidenced by provider attestation and co-signature Inter-disciplinary care team collaboration (see longitudinal plan of care) Evaluation of current treatment plan related to hypertension self management and patient's adherence to plan as  established by provider. Provided education to patient re: stroke prevention, s/s of heart attack and stroke, DASH diet, complications of uncontrolled blood pressure Reviewed medications with patient and discussed importance of compliance Discussed plans with patient for ongoing care management follow up and provided patient with direct contact information for care management team Advised patient, providing education and rationale, to monitor blood pressure weekly and record, calling PCP for findings outside established parameters.  Encouraged to keep and attend scheduled medical appointments Encouraged to call provider office for new concerns, questions, or BP outside discussed parameters Encouraged to follow a low sodium diet/DASH diet Encouraged patient to review list of resources provided by Care Guide for adult day care options and CCM Social Worker for [place] Encouraged to check blood pressure weekly and write blood pressure results in a log to take to appointments for provider review Discussed importance of monitoring blood pressure while taking muscle relaxant with other pain medications Patient Goals/Self-Care Activities:: Check blood pressure weekly Write blood pressure results in a log and take log to appointments for provider review Review information sent by Care Guides for Respite Care/Adult Day Care resources for your son Follow Up Plan: The care management team will reach out to the patient again over the next 30 business days.     Patient Care Plan: Chronic Pain (Adult)     Problem Identified: Pain Management Plan (Chronic Pain)   Priority: Medium     Long-Range Goal: Pain Management Plan Developed   Start Date: 02/09/2021  Expected End Date: 08/01/2021  This Visit's Progress: On track  Priority: Medium  Note:   Current Barriers:  Knowledge Deficits related to self-health management of chronic pain; patient reporting severe pain to shoulder.  States her  shoulder is "bone on bone" and she receives injections every 3 months.  Reports pain is causing her to have to sleep in recliner. Patient reporting left groin pain has been resolved with injection this week by orthopedics; also reports she has been taking muscle relaxant to help with pain prescribed by ortho.  States pain in groin is much better and controlled.  Patient does report signs and symptoms of UTI; seen by provider on televisit yesterday and prescribed antibiotics.  States she has started the Cipro and Pyridium.   Chronic Disease Management support and education needs related to chronic pain Clinical Goal(s):  patient will verbalize understanding of plan for pain management. , patient will demonstrate use of different relaxation  skills and/or diversional activities to assist with pain reduction (distraction, imagery, relaxation, massage, acupressure, TENS, heat, and cold application., patient will use pharmacological and nonpharmacological pain relief strategies as prescribed. , and patient will verbalize acceptable level of pain relief and ability to engage in desired activities Interventions:  Collaboration with Einar Pheasant, MD regarding development and update of comprehensive plan of care as evidenced by provider attestation and co-signature Pain assessment performed Medications reviewed and encouraged medication compliance Discussed plans with patient for ongoing care management follow up and provided patient with direct contact information for care management team Evaluation of current treatment plan related  to pain and patient's adherence to plan as established by provider. Pain assessed and pain treatment goals reviewed Patient response to treatment assessed Discussed using ice and heat to help with pain Acknowledged patient as the expert in pain self-management Discussed urinary bladder training exercises (bladder insulation) and thinks she has pulled her left groin muscle during  these therapies Discussed UTI treatment and verified starting newly prescribed antibiotics and encouraged to complete prescribed antibiotics; encouraged to contact PCP if infection does not get better or clear Discussed and encouraged patient to stay hydrated and limit fluids to mainly water and 100% cranberry juice; limit sodas, coffee, tea, dark drinks Discussed importance of monitor self for alertness/mentation with newly prescribed muscle relaxant with other narcotic medications Patient Goals/Self Care Activities:  Learn relaxation techniques and use relaxation during pain  Practice acceptance of chronic pain Practice relaxation or meditation daily Tell myself I can (not I can't) Use distraction techniques Take medication as prescribed Use Ice and Heat to help with pain Complete antibiotics as prescribed and contact PCP if UTI symptoms not getting better or resolved Follow Up Plan: The care management team will reach out to the patient again over the next 30 business days.      Patient Care Plan: LCSW Plan of Care     Problem Identified: Maintain My Quality of Life through [facility].   Priority: High     Goal: Maintain My Quality of Life through [facility].   Start Date: 03/09/2021  Expected End Date: 05/10/2021  This Visit's Progress: On track  Priority: High  Note:   Current Barriers:   Patient with Caregiver Stress, Osteoporosis, Osteoarthritis, Chronic Pain, Hypertension, Unsteady Gait and Degenerative Disc Disease needs Support, Education, and Care Coordination to resolve unmet personal care needs in the home. Patient is unable to consistently perform ADL's/IADL's independently. Patient requires assistance with ambulation. Financial constraints related to inability to pay for in-home care services out-of-pocket. Patient is not eligible to receive funding for in-home care services through Adult Medicaid with the Department of Social Services, due to  exceeding income guidelines. Level of care concerns, for patient and 85 year old mentally disabled son, but refuses placement, of any kind, or to attend an Adult Day Care Programs. Patient is primary caregiver for 85 year old mentally disabled son. Lacks knowledge of available community agencies and resources. Clinical Goals:  Over the next 45 to 60 days, patient will have in-home care services in place, either through Rosealee Albee, Education officer, museum II with the [place] [facility], or through a private agency of choice. Patient will work with LCSW and Rosealee Albee, Social Worker II with the [place] [facility], to coordinate care for in-home aide services. Patient will receive, review and consider arranging in-home care services through a private agency of choice, from the list mailed to her by LCSW, and notify LCSW if assistance is needed with the referral process or with application completion and/or submission.   Patient will attend all scheduled medical appointments as evidenced by patient report and care team review of appointment completion in electronic medical record. Patient will demonstrate improved health management independence as evidenced by having in-home care services in place. Clinical Interventions: Patient interviewed and appropriate assessments performed. Collaboration with patient regarding development and update of comprehensive plan of care as evidenced by provider attestation and co-signature. Inter-disciplinary care team collaboration (see longitudinal plan of care). Interventions performed:  Problem Solving/Task Centered, Psychoeducation/Health Education,  Quality of Sleep Assessed and Sleep Hygiene Techniques Promoted, Caregiver Stress Acknowledged and Consideration of In-Home Care Services Encouraged. Referral placed to Rosealee Albee, Education officer, museum II with the [place] Program. Explained to patient that applications for the [employer] are screened by a Education officer, museum, through the [place], over the phone, and that client's are assessed at 3 different levels to determine what their needs are, whether home management or personal care services.   LCSW further explained to patient, that based on the level they are assessed, she can receive between 4-10 hours of in-home care services per week and that the number of hours determines the number of days she will receive services during that week.  Individuals can be disabled or aged, but cannot receive Adult Medicaid, Veteran Aide and Attendance Benefits, or any other services that would qualify them where in-home aide services are available.   Discussed plans with patient for ongoing care management follow-up and provided direct contact information for care management team. Assisted patient with obtaining information about health plan benefits through [employer]. Provided education to patient regarding level of care options. Assessed needs, level of care concerns, basic eligibility and provided education on [employer] process. LCSW collaboration with Rosealee Albee, Social Worker II with the [place] Program, to verify application is received and processed. Identified resources and durable medical equipment needed in the home to improve safety and promote independence. Patient Goals/Self-Care Activities:  Follow-up with Rosealee Albee, Social Worker II with the [place] [facility], to check the status of your application process. Work with [employer] on a weekly basis until approved for [employer], or until in-home care services are in place through a private agency of choice. Review list of private agencies providing in-home care services, mailed  to you by LCSW. Remember: Always use handrails on the stairs. Always use your cane or walker when ambulating. Always wear your glasses, especially at night or in areas not well lit.  Follow-Up Plan: LCSW will follow-up with patient by telephone on 03/15/2021 at 11:30am.

## 2021-03-09 NOTE — Chronic Care Management (AMB) (Addendum)
Chronic Care Management    Clinical Social Work Note  03/09/2021 Name: Kimberly Roy MRN: [id number] DOB: November 20, 1930  Kimberly Roy is a 87 y.o. year old female who is a primary care patient of Kimberly Pheasant, MD. The CCM team was consulted to assist the patient with chronic disease management and/or care coordination needs related to: [employer], Level of Care Concerns, and Caregiver Stress.   Engaged with patient by telephone for initial visit in response to provider referral for social work chronic care management and care coordination services.   Consent to Services:  The patient was given information about Chronic Care Management services, agreed to services, and gave verbal consent prior to initiation of services.  Please see initial visit note for detailed documentation.   Patient agreed to services and consent obtained.   Assessment: Review of patient past medical history, allergies, medications, and health status, including review of relevant consultants reports was performed today as part of a comprehensive evaluation and provision of chronic care management and care coordination services.     SDOH (Social Determinants of Health) assessments and interventions performed:  SDOH Interventions    Flowsheet Row Most Recent Value  SDOH Interventions   Food Insecurity Interventions Intervention Not Indicated  Financial Strain Interventions Intervention Not Indicated  Housing Interventions Intervention Not Indicated  Intimate Partner Violence Interventions Intervention Not Indicated  Physical Activity Interventions Patient Refused  Stress Interventions Patient Refused  Social Connections Interventions Patient Refused  Transportation Interventions Intervention Not Indicated        Advanced Directives Status: See Care Plan for related entries.  CCM Care Plan  Allergies  Allergen Reactions   Lyrica [Pregabalin] Swelling   Omnicef [Cefdinir] Diarrhea and Nausea And Vomiting    Atarax [Hydroxyzine]     jittery   Dicyclomine Other (See Comments)    Abdominal bloating    Hydroxyzine Hcl     jittery   Levaquin [Levofloxacin] Swelling   Nitrofurantoin Diarrhea   Nucynta Er [Tapentadol Hcl Er] Other (See Comments)    Severe constipation    Oxybutynin Other (See Comments)    Blurred vision   Zoloft [Sertraline Hcl]     Severe headache   Biaxin [Clarithromycin] Other (See Comments) and Rash    Pt does not remember Pt does not remember   Sertraline Nausea And Vomiting    Severe headache Severe headache Other reaction(s): Headache Severe headache   Sulfa Antibiotics Rash    Pt does not remember   Sulfasalazine Rash    Pt does not remember   Tape Rash    Durabond - redness   Tapentadol Other (See Comments) and Rash    _0     Outpatient Encounter Medications as of 03/09/2021  Medication Sig Note   albuterol (VENTOLIN HFA) 108 (90 Base) MCG/ACT inhaler INHALE 2 PUFFS INTO THE LUNGS EVERY 6 (SIX) HOURS AS NEEDED FOR WHEEZING OR SHORTNESS OF BREATH    aluminum-magnesium hydroxide-simethicone (MAALOX) 200-200-20 MG/5ML SUSP Take 15 mLs by mouth 4 (four) times daily -  before meals and at bedtime.  02/09/2021: Reports taking as needed   amLODipine (NORVASC) 10 MG tablet TAKE 1 TABLET BY MOUTH DAILY    aspirin 81 MG EC tablet Take by mouth.    atorvastatin (LIPITOR) 20 MG tablet Take 1 tablet (20 mg total) by mouth at bedtime.    Azelastine HCl 137 MCG/SPRAY SOLN PLACE 1 SPRAY INTO BOTH NOSTRILS TWO TIMES DAILY  benazepril (LOTENSIN) 40 MG tablet TAKE 1 TABLET BY MOUTH DAILY    betamethasone dipropionate 0.05 % cream Apply 1 application topically 2 (two) times daily. 02/09/2021: Reports uses as needed   budesonide-formoterol (SYMBICORT) 80-4.5 MCG/ACT inhaler Inhale 2 puffs into the lungs 2 (two) times daily. 02/09/2021: Reports not taking at this time   busPIRone  (BUSPAR) 10 MG tablet Take 10 mg by mouth 2 (two) times daily.     calcium citrate-vitamin D (CITRACAL+D) 315-200 MG-UNIT tablet Take 1 tablet by mouth daily.    cephALEXin (KEFLEX) 250 MG capsule Take 250 mg by mouth daily.    Cholecalciferol (VITAMIN D3) 1000 UNITS CAPS Take 1 capsule by mouth daily.    ciprofloxacin (CIPRO) 250 MG tablet Take 1 tablet (250 mg total) by mouth 2 (two) times daily.    Diclofenac Sodium 3 % GEL Apply topically 2 (two) times daily. 02/09/2021: As needed   Docusate Sodium (DSS) 100 MG CAPS Take by mouth.    DULoxetine (CYMBALTA) 60 MG capsule TAKE 1 CAPSULE BY MOUTH EVERY DAY.    fluocinonide (LIDEX) 0.05 % external solution Apply topically.    furosemide (LASIX) 20 MG tablet Take 1 tablet (20 mg total) by mouth daily as needed.    gabapentin (NEURONTIN) 300 MG capsule TAKE 2 CAPSULES BY MOUTH 3 TIMES A DAY. 02/09/2021: Reports taking twice a day   HYDROcodone-acetaminophen (NORCO) 10-325 MG tablet Take 1 tablet by mouth 3 (three) times daily.    ketoconazole (NIZORAL) 2 % shampoo Apply 1 application topically 2 (two) times a week.    magnesium oxide (MAG-OX) 400 MG tablet Take 1 tablet (400 mg total) by mouth daily.    Melatonin 10 MG CAPS Take 1 capsule by mouth.    methocarbamol (ROBAXIN) 500 MG tablet Take 500 mg by mouth 3 (three) times daily. As needed    metoprolol succinate (TOPROL-XL) 50 MG 24 hr tablet Take 1 tablet (50 mg total) by mouth daily. Take with or immediately following a meal.    montelukast (SINGULAIR) 10 MG tablet 10 mg once daily as needed    morphine (MS CONTIN) 60 MG 12 hr tablet Take 1 tablet (60 mg total) by mouth 2 (two) times daily.    mupirocin ointment (BACTROBAN) 2 % Apply 1 application topically 2 (two) times daily.    Naldemedine Tosylate 0.2 MG TABS Take 0.2 mg by mouth daily.    phenazopyridine (PYRIDIUM) 100 MG tablet Take 1 tablet (100 mg total) by mouth 3 (three) times daily as needed for pain.    polyethylene glycol powder  (GLYCOLAX/MIRALAX) 17 GM/SCOOP powder MIX 17 GRAMS AS MARKED ON BOTTLE TOP IN 8 OUNCES OF WATER AND DRINK ONCE A DAY AS DIRECTED.    RABEprazole (ACIPHEX) 20 MG tablet TAKE 1 TABLET BY MOUTH TWICE A DAY BEFORE A MEAL.    senna (SENOKOT) 8.6 MG TABS tablet Take 1 tablet by mouth 2 (two) times daily.    Simethicone (GAS-X PO) Take 2 tablets by mouth 2 (two) times daily.    sodium chloride (OCEAN) 0.65 % nasal spray Place 1 spray into the nose as needed.    SYNTHROID 100 MCG tablet Take 1 tablet (100 mcg total) by mouth daily.    traZODone (DESYREL) 50 MG tablet Take 25-50 mg by mouth at bedtime as needed.    triamcinolone ointment (KENALOG) 0.1 % SMARTSIG:1 Application Topical 2x/day    No facility-administered encounter medications on file as of 03/09/2021.    Patient Active Problem List  Diagnosis Date Noted   Rash 03/03/2021   Skin lesion 03/03/2021   Aortic atherosclerosis ([place]) 11/20/2020   Bradycardia 06/08/2020   Itching 04/08/2020   Dyspnea 01/17/2020   Chronic respiratory failure with hypoxia ([facility]) 01/17/2020   Right hip pain 01/10/2020   Leukocytosis 11/15/2019   Wound of buttock 10/25/2019   COVID-19 virus infection 10/04/2019   Urinary frequency 09/26/2019   Cold feeling 09/12/2019   Left knee pain 07/04/2019   Hemoptysis 06/30/2019   Femur fracture ([place]) 08/06/2018   Primary osteoarthritis of right shoulder 08/15/2017   Right shoulder pain 05/16/2017   Chronic venous insufficiency 02/10/2017   Lymphedema 02/10/2017   Neuropathy 12/25/2016   Anemia due to blood loss, chronic 08/07/2016   GERD (gastroesophageal reflux disease) 12/10/2015   Finger pain 11/13/2015   Carotid artery calcification 09/26/2015   External nasal lesion 09/25/2015   Muscle cramps 09/01/2015   Excessive sweating 07/30/2015   Headache 07/16/2015   Groin pain 05/14/2015   Muscle twitching 03/05/2015   Leg cramps 02/11/2015   Back pain 02/11/2015   Abdominal pain 02/11/2015   Chronic pain  01/19/2015   Acute cystitis without hematuria 12/12/2014   Left elbow pain 11/30/2014   Health care maintenance 11/30/2014   Osteoporosis 10/19/2014   Rectal bleeding 10/19/2014   Neck pain 09/03/2014   Unsteady gait 09/03/2014   Nocturia 09/03/2014   Dysphagia 06/01/2014   Nasal dryness 06/01/2014   Stress 06/01/2014   Fatigue 02/27/2014   Degenerative disc disease 12/21/2013   Pre-op evaluation 10/12/2013   Hoarseness 08/14/2013   Leg swelling 01/24/2013   CHF (congestive heart failure) ([place]) 12/29/2012   Cough 12/29/2012   OSA (obstructive sleep apnea) 07/09/2012   Osteoarthritis 07/04/2012   Anemia 07/04/2012   Chronic constipation 07/04/2012   Pulmonary hypertension ([place]) 07/04/2012   Pulmonary nodules 07/04/2012   Hypertension 07/04/2012   Hyperlipidemia 07/04/2012   Hypothyroidism 07/04/2012    Conditions to be addressed/monitored:  Caregiver Stress and Chronic Pain. Limited Social Support, Level of Care Concerns, ADL/IADL Limitations.  Care Plan : LCSW Plan of Care  Updates made by Francis Gaines, LCSW since 03/09/2021 12:00 AM     Problem: Maintain My Quality of Life through [facility].   Priority: High     Goal: Maintain My Quality of Life through [facility].   Start Date: 03/09/2021  Expected End Date: 05/10/2021  This Visit's Progress: On track  Priority: High  Note:   Current Barriers:   Patient with Caregiver Stress, Osteoporosis, Osteoarthritis, Chronic Pain, Hypertension, Unsteady Gait and Degenerative Disc Disease needs Support, Education, and Care Coordination to resolve unmet personal care needs in the home. Patient is unable to consistently perform ADL's/IADL's independently. Patient requires assistance with ambulation. Financial constraints related to inability to pay for in-home care services out-of-pocket. Patient is not eligible to receive funding for in-home care services through Adult Medicaid with the Department of  Social Services, due to exceeding income guidelines. Level of care concerns, for patient and 85 year old mentally disabled son, but refuses placement, of any kind, or to attend an Adult Day Care Programs. Patient is primary caregiver for 85 year old mentally disabled son. Lacks knowledge of available community agencies and resources. Clinical Goals:  Over the next 45 to 60 days, patient will have in-home care services in place, either through Rosealee Albee, Education officer, museum II with the [place] [facility], or through a private agency of choice. Patient will work with LCSW and  Rosealee Albee, Social Worker II with the [place] [facility], to coordinate care for in-home aide services. Patient will receive, review and consider arranging in-home care services through a private agency of choice, from the list mailed to her by LCSW, and notify LCSW if assistance is needed with the referral process or with application completion and/or submission.   Patient will attend all scheduled medical appointments as evidenced by patient report and care team review of appointment completion in electronic medical record. Patient will demonstrate improved health management independence as evidenced by having in-home care services in place. Clinical Interventions: Patient interviewed and appropriate assessments performed. Collaboration with patient regarding development and update of comprehensive plan of care as evidenced by provider attestation and co-signature. Inter-disciplinary care team collaboration (see longitudinal plan of care). Interventions performed:  Problem Solving/Task Centered, Psychoeducation/Health Education, Quality of Sleep Assessed and Sleep Hygiene Techniques Promoted, Caregiver Stress Acknowledged and Consideration of In-Home Care Services Encouraged. Referral placed to Rosealee Albee, Education officer, museum II with the  [place] [facility]. Explained to patient that applications for the [employer] are screened by a Education officer, museum, through the [place], over the phone, and that client's are assessed at 3 different levels to determine what their needs are, whether home management or personal care services.   LCSW further explained to patient, that based on the level they are assessed, she can receive between 4-10 hours of in-home care services per week and that the number of hours determines the number of days she will receive services during that week.  Individuals can be disabled or aged, but cannot receive Adult Medicaid, Veteran Aide and Attendance Benefits, or any other services that would qualify them where in-home aide services are available.   Discussed plans with patient for ongoing care management follow-up and provided direct contact information for care management team. Assisted patient with obtaining information about health plan benefits through [employer]. Provided education to patient regarding level of care options. Assessed needs, level of care concerns, basic eligibility and provided education on [employer] process. LCSW collaboration with Rosealee Albee, Social Worker II with the [place] [facility], to verify application is received and processed. Identified resources and durable medical equipment needed in the home to improve safety and promote independence. Patient Goals/Self-Care Activities:  Follow-up with Rosealee Albee, Social Worker II with the [place] [facility], to check the status of your application process. Work with [employer] on a weekly basis until approved for [employer], or until in-home care services are in place through a private agency of choice. Review list of private agencies providing  in-home care services, mailed to you by LCSW. Remember: Always use handrails on the stairs. Always use your cane or walker when ambulating. Always wear your glasses, especially at night or in areas not well lit.  Follow-Up Plan: LCSW will follow-up with patient by telephone on 03/15/2021 at 11:30am.      Follow Up Plan:  LCSW will follow-up with patient by phone on 03/15/2021 at 11:30am.      Nat Christen LCSW Licensed Clinical Social Worker [place]  [phone]

## 2021-03-09 NOTE — Chronic Care Management (AMB) (Signed)
Chronic Care Management   CCM RN Visit Note  03/09/2021 Name: Kimberly Roy MRN: [id number] DOB: [birth year]  Subjective: Kimberly Roy is a 87 y.o. year old female who is a primary care patient of Einar Pheasant, MD. The care management team was consulted for assistance with disease management and care coordination needs.    Engaged with patient by telephone for follow up visit in response to provider referral for case management and/or care coordination services.   Consent to Services:  The patient was given information about Chronic Care Management services, agreed to services, and gave verbal consent prior to initiation of services.  Please see initial visit note for detailed documentation.   Patient agreed to services and verbal consent obtained.   Assessment: Review of patient past medical history, allergies, medications, health status, including review of consultants reports, laboratory and other test data, was performed as part of comprehensive evaluation and provision of chronic care management services.   SDOH (Social Determinants of Health) assessments and interventions performed:    CCM Care Plan  Allergies  Allergen Reactions   Lyrica [Pregabalin] Swelling   Omnicef [Cefdinir] Diarrhea and Nausea And Vomiting   Atarax [Hydroxyzine]     jittery   Dicyclomine Other (See Comments)    Abdominal bloating    Hydroxyzine Hcl     jittery   Levaquin [Levofloxacin] Swelling   Nitrofurantoin Diarrhea   Nucynta Er [Tapentadol Hcl Er] Other (See Comments)    Severe constipation    Oxybutynin Other (See Comments)    Blurred vision   Zoloft [Sertraline Hcl]     Severe headache   Biaxin [Clarithromycin] Other (See Comments) and Rash    Pt does not remember Pt does not remember   Sertraline Nausea And Vomiting    Severe headache Severe headache Other reaction(s): Headache Severe headache   Sulfa Antibiotics Rash    Pt does not remember   Sulfasalazine Rash    Pt does not  remember   Tape Rash    Durabond - redness   Tapentadol Other (See Comments) and Rash    _0     Outpatient Encounter Medications as of 03/09/2021  Medication Sig Note   ciprofloxacin (CIPRO) 250 MG tablet Take 1 tablet (250 mg total) by mouth 2 (two) times daily.    methocarbamol (ROBAXIN) 500 MG tablet Take 500 mg by mouth 3 (three) times daily. As needed    phenazopyridine (PYRIDIUM) 100 MG tablet Take 1 tablet (100 mg total) by mouth 3 (three) times daily as needed for pain.    albuterol (VENTOLIN HFA) 108 (90 Base) MCG/ACT inhaler INHALE 2 PUFFS INTO THE LUNGS EVERY 6 (SIX) HOURS AS NEEDED FOR WHEEZING OR SHORTNESS OF BREATH    aluminum-magnesium hydroxide-simethicone (MAALOX) 159-458-59 MG/5ML SUSP Take 15 mLs by mouth 4 (four) times daily -  before meals and at bedtime.  02/09/2021: Reports taking as needed   amLODipine (NORVASC) 10 MG tablet TAKE 1 TABLET BY MOUTH DAILY    aspirin 81 MG EC tablet Take by mouth.    atorvastatin (LIPITOR) 20 MG tablet Take 1 tablet (20 mg total) by mouth at bedtime.    Azelastine HCl 137 MCG/SPRAY SOLN PLACE 1 SPRAY INTO BOTH NOSTRILS TWO TIMES DAILY    benazepril (LOTENSIN) 40 MG tablet TAKE 1 TABLET BY MOUTH DAILY    betamethasone dipropionate 0.05 % cream Apply 1 application topically 2 (two) times daily. 02/09/2021: Reports uses as needed  budesonide-formoterol (SYMBICORT) 80-4.5 MCG/ACT inhaler Inhale 2 puffs into the lungs 2 (two) times daily. 02/09/2021: Reports not taking at this time   busPIRone (BUSPAR) 10 MG tablet Take 10 mg by mouth 2 (two) times daily.     calcium citrate-vitamin D (CITRACAL+D) 315-200 MG-UNIT tablet Take 1 tablet by mouth daily.    cephALEXin (KEFLEX) 250 MG capsule Take 250 mg by mouth daily.    Cholecalciferol (VITAMIN D3) 1000 UNITS CAPS Take 1 capsule by mouth daily.    Diclofenac Sodium 3 % GEL Apply topically 2 (two)  times daily. 02/09/2021: As needed   Docusate Sodium (DSS) 100 MG CAPS Take by mouth.    DULoxetine (CYMBALTA) 60 MG capsule TAKE 1 CAPSULE BY MOUTH EVERY DAY.    fluocinonide (LIDEX) 0.05 % external solution Apply topically.    furosemide (LASIX) 20 MG tablet Take 1 tablet (20 mg total) by mouth daily as needed.    gabapentin (NEURONTIN) 300 MG capsule TAKE 2 CAPSULES BY MOUTH 3 TIMES A DAY. 02/09/2021: Reports taking twice a day   HYDROcodone-acetaminophen (NORCO) 10-325 MG tablet Take 1 tablet by mouth 3 (three) times daily.    ketoconazole (NIZORAL) 2 % shampoo Apply 1 application topically 2 (two) times a week.    magnesium oxide (MAG-OX) 400 MG tablet Take 1 tablet (400 mg total) by mouth daily.    Melatonin 10 MG CAPS Take 1 capsule by mouth.    metoprolol succinate (TOPROL-XL) 50 MG 24 hr tablet Take 1 tablet (50 mg total) by mouth daily. Take with or immediately following a meal.    montelukast (SINGULAIR) 10 MG tablet 10 mg once daily as needed    morphine (MS CONTIN) 60 MG 12 hr tablet Take 1 tablet (60 mg total) by mouth 2 (two) times daily.    mupirocin ointment (BACTROBAN) 2 % Apply 1 application topically 2 (two) times daily.    Naldemedine Tosylate 0.2 MG TABS Take 0.2 mg by mouth daily.    polyethylene glycol powder (GLYCOLAX/MIRALAX) 17 GM/SCOOP powder MIX 17 GRAMS AS MARKED ON BOTTLE TOP IN 8 OUNCES OF WATER AND DRINK ONCE A DAY AS DIRECTED.    RABEprazole (ACIPHEX) 20 MG tablet TAKE 1 TABLET BY MOUTH TWICE A DAY BEFORE A MEAL.    senna (SENOKOT) 8.6 MG TABS tablet Take 1 tablet by mouth 2 (two) times daily.    Simethicone (GAS-X PO) Take 2 tablets by mouth 2 (two) times daily.    sodium chloride (OCEAN) 0.65 % nasal spray Place 1 spray into the nose as needed.    SYNTHROID 100 MCG tablet Take 1 tablet (100 mcg total) by mouth daily.    traZODone (DESYREL) 50 MG tablet Take 25-50 mg by mouth at bedtime as needed.    triamcinolone ointment (KENALOG) 0.1 % SMARTSIG:1 Application  Topical 2x/day    No facility-administered encounter medications on file as of 03/09/2021.    Patient Active Problem List   Diagnosis Date Noted   Rash 03/03/2021   Skin lesion 03/03/2021   Aortic atherosclerosis ([place]) 11/20/2020   Bradycardia 06/08/2020   Itching 04/08/2020   Dyspnea 01/17/2020   Chronic respiratory failure with hypoxia ([place]) 01/17/2020   Right hip pain 01/10/2020   Leukocytosis 11/15/2019   Wound of buttock 10/25/2019   COVID-19 virus infection 10/04/2019   Urinary frequency 09/26/2019   Cold feeling 09/12/2019   Left knee pain 07/04/2019   Hemoptysis 06/30/2019   Femur fracture ([place]) 08/06/2018   Primary osteoarthritis of right shoulder 08/15/2017  Right shoulder pain 05/16/2017   Chronic venous insufficiency 02/10/2017   Lymphedema 02/10/2017   Neuropathy 12/25/2016   Anemia due to blood loss, chronic 08/07/2016   GERD (gastroesophageal reflux disease) 12/10/2015   Finger pain 11/13/2015   Carotid artery calcification 09/26/2015   External nasal lesion 09/25/2015   Muscle cramps 09/01/2015   Excessive sweating 07/30/2015   Headache 07/16/2015   Groin pain 05/14/2015   Muscle twitching 03/05/2015   Leg cramps 02/11/2015   Back pain 02/11/2015   Abdominal pain 02/11/2015   Chronic pain 01/19/2015   Acute cystitis without hematuria 12/12/2014   Left elbow pain 11/30/2014   Health care maintenance 11/30/2014   Osteoporosis 10/19/2014   Rectal bleeding 10/19/2014   Neck pain 09/03/2014   Unsteady gait 09/03/2014   Nocturia 09/03/2014   Dysphagia 06/01/2014   Nasal dryness 06/01/2014   Stress 06/01/2014   Fatigue 02/27/2014   Degenerative disc disease 12/21/2013   Pre-op evaluation 10/12/2013   Hoarseness 08/14/2013   Leg swelling 01/24/2013   CHF (congestive heart failure) ([place]) 12/29/2012   Cough 12/29/2012   OSA (obstructive sleep apnea) 07/09/2012   Osteoarthritis 07/04/2012   Anemia 07/04/2012   Chronic constipation 07/04/2012    Pulmonary hypertension ([place]) 07/04/2012   Pulmonary nodules 07/04/2012   Hypertension 07/04/2012   Hyperlipidemia 07/04/2012   Hypothyroidism 07/04/2012    Conditions to be addressed/monitored:HTN and Pain  Care Plan : Hypertension (Adult)  Updates made by Leona Singleton, RN since 03/09/2021 12:00 AM     Problem: Hypertension (Hypertension)   Priority: Medium     Long-Range Goal: Hypertension Monitored   Start Date: 02/09/2021  Expected End Date: 08/01/2021  This Visit's Progress: Not on track  Priority: Medium  Note:   Objective:  Last practice recorded BP readings:  BP Readings from Last 3 Encounters:  03/03/21 130/62  02/20/21 (!) 157/71  12/27/20 (!) 130/47  Current Barriers:  Knowledge Deficits related to basic understanding of hypertension pathophysiology and self care management; patient reports she does not monitor blood pressures often at home.  Does state that when she does it is typically normal.  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  Continues to report not monitoring blood pressures, due to not feeling well the past few weeks.  Discussed importance of monitoring blood pressure.  Reports she has received email list of Respite Care and Adult Day Care options Case Manager Clinical Goal(s):  patient will verbalize understanding of plan for hypertension management patient will demonstrate improved adherence to prescribed treatment plan for hypertension as evidenced by taking all medications as prescribed, monitoring and recording blood pressure as directed, adhering to low sodium/DASH diet Interventions:  Collaboration with Einar Pheasant, MD regarding development and update of comprehensive plan of care as evidenced by provider attestation and co-signature Inter-disciplinary care team collaboration (see longitudinal plan of care) Evaluation of current treatment plan related to hypertension self management and patient's adherence to  plan as established by provider. Provided education to patient re: stroke prevention, s/s of heart attack and stroke, DASH diet, complications of uncontrolled blood pressure Reviewed medications with patient and discussed importance of compliance Discussed plans with patient for ongoing care management follow up and provided patient with direct contact information for care management team Advised patient, providing education and rationale, to monitor blood pressure weekly and record, calling PCP for findings outside established parameters.  Encouraged to keep and attend scheduled medical appointments Encouraged to call provider office for  new concerns, questions, or BP outside discussed parameters Encouraged to follow a low sodium diet/DASH diet Encouraged patient to review list of resources provided by Care Guide for adult day care options and CCM Social Worker for [place] Encouraged to check blood pressure weekly and write blood pressure results in a log to take to appointments for provider review Discussed importance of monitoring blood pressure while taking muscle relaxant with other pain medications Patient Goals/Self-Care Activities:: Check blood pressure weekly Write blood pressure results in a log and take log to appointments for provider review Review information sent by Care Guides for Respite Care/Adult Day Care resources for your son Follow Up Plan: The care management team will reach out to the patient again over the next 30 business days.     Care Plan : Chronic Pain (Adult)  Updates made by Leona Singleton, RN since 03/09/2021 12:00 AM     Problem: Pain Management Plan (Chronic Pain)   Priority: Medium     Long-Range Goal: Pain Management Plan Developed   Start Date: 02/09/2021  Expected End Date: 08/01/2021  This Visit's Progress: On track  Priority: Medium  Note:   Current Barriers:  Knowledge Deficits related to self-health management of chronic pain;  patient reporting severe pain to shoulder.  States her shoulder is "bone on bone" and she receives injections every 3 months.  Reports pain is causing her to have to sleep in recliner. Patient reporting left groin pain has been resolved with injection this week by orthopedics; also reports she has been taking muscle relaxant to help with pain prescribed by ortho.  States pain in groin is much better and controlled.  Patient does report signs and symptoms of UTI; seen by provider on televisit yesterday and prescribed antibiotics.  States she has started the Cipro and Pyridium.   Chronic Disease Management support and education needs related to chronic pain Clinical Goal(s):  patient will verbalize understanding of plan for pain management. , patient will demonstrate use of different relaxation  skills and/or diversional activities to assist with pain reduction (distraction, imagery, relaxation, massage, acupressure, TENS, heat, and cold application., patient will use pharmacological and nonpharmacological pain relief strategies as prescribed. , and patient will verbalize acceptable level of pain relief and ability to engage in desired activities Interventions:  Collaboration with Einar Pheasant, MD regarding development and update of comprehensive plan of care as evidenced by provider attestation and co-signature Pain assessment performed Medications reviewed and encouraged medication compliance Discussed plans with patient for ongoing care management follow up and provided patient with direct contact information for care management team Evaluation of current treatment plan related to pain and patient's adherence to plan as established by provider. Pain assessed and pain treatment goals reviewed Patient response to treatment assessed Discussed using ice and heat to help with pain Acknowledged patient as the expert in pain self-management Discussed urinary bladder training exercises (bladder insulation)  and thinks she has pulled her left groin muscle during these therapies Discussed UTI treatment and verified starting newly prescribed antibiotics and encouraged to complete prescribed antibiotics; encouraged to contact PCP if infection does not get better or clear Discussed and encouraged patient to stay hydrated and limit fluids to mainly water and 100% cranberry juice; limit sodas, coffee, tea, dark drinks Discussed importance of monitor self for alertness/mentation with newly prescribed muscle relaxant with other narcotic medications Patient Goals/Self Care Activities:  Learn relaxation techniques and use relaxation during pain  Practice acceptance of chronic pain Practice relaxation  or meditation daily Tell myself I can (not I can't) Use distraction techniques Take medication as prescribed Use Ice and Heat to help with pain Complete antibiotics as prescribed and contact PCP if UTI symptoms not getting better or resolved Follow Up Plan: The care management team will reach out to the patient again over the next 30 business days.       Plan:The care management team will reach out to the patient again over the next 30 business days.  Hubert Azure RN, MSN RN Care Management Coordinator [place] [phone] [email or web address]

## 2021-03-12 DIAGNOSIS — N390 Urinary tract infection, site not specified: Secondary | ICD-10-CM | POA: Diagnosis not present

## 2021-03-12 DIAGNOSIS — N952 Postmenopausal atrophic vaginitis: Secondary | ICD-10-CM | POA: Diagnosis not present

## 2021-03-12 DIAGNOSIS — N3010 Interstitial cystitis (chronic) without hematuria: Secondary | ICD-10-CM | POA: Diagnosis not present

## 2021-03-13 ENCOUNTER — Telehealth: Payer: Self-pay

## 2021-03-13 NOTE — Telephone Encounter (Signed)
Pt would like to talk with you about Pulse oximeter. Please advise

## 2021-03-13 NOTE — Telephone Encounter (Signed)
Pt wanted to know if I knew of any free services or places that has an affiliation with [place] that could provide free pulse ox. I advised patient that I did not & that she possibly could reach out to her insurance to see if they did. I let her know is store they are usually around $40 or so.

## 2021-03-14 ENCOUNTER — Encounter: Payer: Self-pay | Admitting: *Deleted

## 2021-03-15 ENCOUNTER — Telehealth: Payer: PPO

## 2021-03-22 DIAGNOSIS — N281 Cyst of kidney, acquired: Secondary | ICD-10-CM | POA: Diagnosis not present

## 2021-03-22 DIAGNOSIS — R399 Unspecified symptoms and signs involving the genitourinary system: Secondary | ICD-10-CM | POA: Diagnosis not present

## 2021-03-23 ENCOUNTER — Ambulatory Visit: Payer: PPO | Admitting: *Deleted

## 2021-03-23 DIAGNOSIS — M25512 Pain in left shoulder: Secondary | ICD-10-CM | POA: Diagnosis not present

## 2021-03-23 DIAGNOSIS — M25511 Pain in right shoulder: Secondary | ICD-10-CM | POA: Diagnosis not present

## 2021-03-23 DIAGNOSIS — I700 Atherosclerosis of aorta: Secondary | ICD-10-CM

## 2021-03-23 DIAGNOSIS — I10 Essential (primary) hypertension: Secondary | ICD-10-CM

## 2021-03-23 DIAGNOSIS — I509 Heart failure, unspecified: Secondary | ICD-10-CM

## 2021-03-23 DIAGNOSIS — M810 Age-related osteoporosis without current pathological fracture: Secondary | ICD-10-CM

## 2021-03-23 DIAGNOSIS — E785 Hyperlipidemia, unspecified: Secondary | ICD-10-CM

## 2021-03-23 DIAGNOSIS — R2681 Unsteadiness on feet: Secondary | ICD-10-CM

## 2021-03-23 DIAGNOSIS — M1990 Unspecified osteoarthritis, unspecified site: Secondary | ICD-10-CM

## 2021-03-23 DIAGNOSIS — Z5181 Encounter for therapeutic drug level monitoring: Secondary | ICD-10-CM | POA: Diagnosis not present

## 2021-03-23 DIAGNOSIS — F439 Reaction to severe stress, unspecified: Secondary | ICD-10-CM

## 2021-03-23 DIAGNOSIS — Z79899 Other long term (current) drug therapy: Secondary | ICD-10-CM | POA: Diagnosis not present

## 2021-03-23 NOTE — Patient Instructions (Signed)
Visit Information  PATIENT GOALS:  Goals Addressed               This Visit's Progress     Maintain My Quality of Life through [facility]. (pt-stated)   On track     Timeframe:  Short-Term Goal Priority:  High Start Date:  03/09/2021                         Expected End Date:  05/10/2021               Follow-Up Date:  04/06/2021 at 10:00am.   Patient Goals/Self-Care Activities:  Follow-up with Rosealee Albee, Social Worker II with the [facility], to check the status of your application process. LCSW has placed you on the waiting list for the [facility]. Work with LCSW on a weekly basis until approved for [employer], or until in-home care services are in place through a private agency of choice. Continue to review list of private agencies providing in-home care services, mailed to you by LCSW, and be prepared to provide LCSW with at least 4 agencies of interest, during our next scheduled telephone outreach.         Patient verbalizes understanding of instructions provided today and agrees to view in [place].   Telephone follow-up appointment with care management team member scheduled for:  04/06/2021 at 10:00am.  Nat Christen LCSW Licensed Clinical Social Worker [place]  [phone]

## 2021-03-23 NOTE — Chronic Care Management (AMB) (Signed)
Chronic Care Management    Clinical Social Work Note  03/23/2021 Name: Kimberly Roy MRN: [id number] DOB: September 13, 1930  Kimberly Roy is a 87 y.o. year old female who is a primary care patient of Einar Pheasant, MD. The CCM team was consulted to assist the patient with chronic disease management and/or care coordination needs related to: [employer] , Level of Care Concerns, and Caregiver Stress.   Engaged with patient by telephone for follow up visit in response to provider referral for social work chronic care management and care coordination services.   Consent to Services:  The patient was given information about Chronic Care Management services, agreed to services, and gave verbal consent prior to initiation of services.  Please see initial visit note for detailed documentation.   Patient agreed to services and consent obtained.   Assessment: Review of patient past medical history, allergies, medications, and health status, including review of relevant consultants reports was performed today as part of a comprehensive evaluation and provision of chronic care management and care coordination services.     SDOH (Social Determinants of Health) assessments and interventions performed:    Advanced Directives Status: Not addressed in this encounter.  CCM Care Plan  Allergies  Allergen Reactions   Lyrica [Pregabalin] Swelling   Omnicef [Cefdinir] Diarrhea and Nausea And Vomiting   Atarax [Hydroxyzine]     jittery   Dicyclomine Other (See Comments)    Abdominal bloating    Hydroxyzine Hcl     jittery   Levaquin [Levofloxacin] Swelling   Nitrofurantoin Diarrhea   Nucynta Er [Tapentadol Hcl Er] Other (See Comments)    Severe constipation    Oxybutynin Other (See Comments)    Blurred vision   Zoloft [Sertraline Hcl]     Severe headache   Biaxin [Clarithromycin] Other (See Comments) and Rash    Pt does not remember Pt does not remember   Sertraline Nausea And Vomiting     Severe headache Severe headache Other reaction(s): Headache Severe headache   Sulfa Antibiotics Rash    Pt does not remember   Sulfasalazine Rash    Pt does not remember   Tape Rash    Durabond - redness   Tapentadol Other (See Comments) and Rash    _0     Outpatient Encounter Medications as of 03/23/2021  Medication Sig Note   albuterol (VENTOLIN HFA) 108 (90 Base) MCG/ACT inhaler INHALE 2 PUFFS INTO THE LUNGS EVERY 6 (SIX) HOURS AS NEEDED FOR WHEEZING OR SHORTNESS OF BREATH    aluminum-magnesium hydroxide-simethicone (MAALOX) 200-200-20 MG/5ML SUSP Take 15 mLs by mouth 4 (four) times daily -  before meals and at bedtime.  02/09/2021: Reports taking as needed   amLODipine (NORVASC) 10 MG tablet TAKE 1 TABLET BY MOUTH DAILY    aspirin 81 MG EC tablet Take by mouth.    atorvastatin (LIPITOR) 20 MG tablet Take 1 tablet (20 mg total) by mouth at bedtime.    Azelastine HCl 137 MCG/SPRAY SOLN PLACE 1 SPRAY INTO BOTH NOSTRILS TWO TIMES DAILY    benazepril (LOTENSIN) 40 MG tablet TAKE 1 TABLET BY MOUTH DAILY    betamethasone dipropionate 0.05 % cream Apply 1 application topically 2 (two) times daily. 02/09/2021: Reports uses as needed   budesonide-formoterol (SYMBICORT) 80-4.5 MCG/ACT inhaler Inhale 2 puffs into the lungs 2 (two) times daily. 02/09/2021: Reports not taking at this time   busPIRone (BUSPAR) 10 MG tablet Take 10 mg by mouth  2 (two) times daily.     calcium citrate-vitamin D (CITRACAL+D) 315-200 MG-UNIT tablet Take 1 tablet by mouth daily.    cephALEXin (KEFLEX) 250 MG capsule Take 250 mg by mouth daily.    Cholecalciferol (VITAMIN D3) 1000 UNITS CAPS Take 1 capsule by mouth daily.    ciprofloxacin (CIPRO) 250 MG tablet Take 1 tablet (250 mg total) by mouth 2 (two) times daily.    Diclofenac Sodium 3 % GEL Apply topically 2 (two) times daily. 02/09/2021: As needed   Docusate Sodium  (DSS) 100 MG CAPS Take by mouth.    DULoxetine (CYMBALTA) 60 MG capsule TAKE 1 CAPSULE BY MOUTH EVERY DAY.    fluocinonide (LIDEX) 0.05 % external solution Apply topically.    furosemide (LASIX) 20 MG tablet Take 1 tablet (20 mg total) by mouth daily as needed.    gabapentin (NEURONTIN) 300 MG capsule TAKE 2 CAPSULES BY MOUTH 3 TIMES A DAY. 02/09/2021: Reports taking twice a day   HYDROcodone-acetaminophen (NORCO) 10-325 MG tablet Take 1 tablet by mouth 3 (three) times daily.    ketoconazole (NIZORAL) 2 % shampoo Apply 1 application topically 2 (two) times a week.    magnesium oxide (MAG-OX) 400 MG tablet Take 1 tablet (400 mg total) by mouth daily.    Melatonin 10 MG CAPS Take 1 capsule by mouth.    methocarbamol (ROBAXIN) 500 MG tablet Take 500 mg by mouth 3 (three) times daily. As needed    metoprolol succinate (TOPROL-XL) 50 MG 24 hr tablet Take 1 tablet (50 mg total) by mouth daily. Take with or immediately following a meal.    montelukast (SINGULAIR) 10 MG tablet 10 mg once daily as needed    morphine (MS CONTIN) 60 MG 12 hr tablet Take 1 tablet (60 mg total) by mouth 2 (two) times daily.    mupirocin ointment (BACTROBAN) 2 % Apply 1 application topically 2 (two) times daily.    Naldemedine Tosylate 0.2 MG TABS Take 0.2 mg by mouth daily.    phenazopyridine (PYRIDIUM) 100 MG tablet Take 1 tablet (100 mg total) by mouth 3 (three) times daily as needed for pain.    polyethylene glycol powder (GLYCOLAX/MIRALAX) 17 GM/SCOOP powder MIX 17 GRAMS AS MARKED ON BOTTLE TOP IN 8 OUNCES OF WATER AND DRINK ONCE A DAY AS DIRECTED.    RABEprazole (ACIPHEX) 20 MG tablet TAKE 1 TABLET BY MOUTH TWICE A DAY BEFORE A MEAL.    senna (SENOKOT) 8.6 MG TABS tablet Take 1 tablet by mouth 2 (two) times daily.    Simethicone (GAS-X PO) Take 2 tablets by mouth 2 (two) times daily.    sodium chloride (OCEAN) 0.65 % nasal spray Place 1 spray into the nose as needed.    SYNTHROID 100 MCG tablet Take 1 tablet (100 mcg  total) by mouth daily.    traZODone (DESYREL) 50 MG tablet Take 25-50 mg by mouth at bedtime as needed.    triamcinolone ointment (KENALOG) 0.1 % SMARTSIG:1 Application Topical 2x/day    No facility-administered encounter medications on file as of 03/23/2021.    Patient Active Problem List   Diagnosis Date Noted   Rash 03/03/2021   Skin lesion 03/03/2021   Aortic atherosclerosis ([place]) 11/20/2020   Bradycardia 06/08/2020   Itching 04/08/2020   Dyspnea 01/17/2020   Chronic respiratory failure with hypoxia ([place]) 01/17/2020   Right hip pain 01/10/2020   Leukocytosis 11/15/2019   Wound of buttock 10/25/2019   COVID-19 virus infection 10/04/2019   Urinary frequency 09/26/2019  Cold feeling 09/12/2019   Left knee pain 07/04/2019   Hemoptysis 06/30/2019   Femur fracture ([place]) 08/06/2018   Primary osteoarthritis of right shoulder 08/15/2017   Right shoulder pain 05/16/2017   Chronic venous insufficiency 02/10/2017   Lymphedema 02/10/2017   Neuropathy 12/25/2016   Anemia due to blood loss, chronic 08/07/2016   GERD (gastroesophageal reflux disease) 12/10/2015   Finger pain 11/13/2015   Carotid artery calcification 09/26/2015   External nasal lesion 09/25/2015   Muscle cramps 09/01/2015   Excessive sweating 07/30/2015   Headache 07/16/2015   Groin pain 05/14/2015   Muscle twitching 03/05/2015   Leg cramps 02/11/2015   Back pain 02/11/2015   Abdominal pain 02/11/2015   Chronic pain 01/19/2015   Acute cystitis without hematuria 12/12/2014   Left elbow pain 11/30/2014   Health care maintenance 11/30/2014   Osteoporosis 10/19/2014   Rectal bleeding 10/19/2014   Neck pain 09/03/2014   Unsteady gait 09/03/2014   Nocturia 09/03/2014   Dysphagia 06/01/2014   Nasal dryness 06/01/2014   Stress 06/01/2014   Fatigue 02/27/2014   Degenerative disc disease 12/21/2013   Pre-op evaluation 10/12/2013   Hoarseness 08/14/2013   Leg swelling 01/24/2013   CHF (congestive heart failure)  ([place]) 12/29/2012   Cough 12/29/2012   OSA (obstructive sleep apnea) 07/09/2012   Osteoarthritis 07/04/2012   Anemia 07/04/2012   Chronic constipation 07/04/2012   Pulmonary hypertension ([place]) 07/04/2012   Pulmonary nodules 07/04/2012   Hypertension 07/04/2012   Hyperlipidemia 07/04/2012   Hypothyroidism 07/04/2012    Conditions to be addressed/monitored: CHF and HTN. Limited Social Support, Level of Care Concerns, ADL/IADL Limitations, Limited Access to Caregiver, and Lacks Knowledge of [employer].  Care Plan : LCSW Plan of Care  Updates made by Francis Gaines, LCSW since 03/23/2021 12:00 AM     Problem: Maintain My Quality of Life through [facility].   Priority: High     Goal: Maintain My Quality of Life through [facility].   Start Date: 03/09/2021  Expected End Date: 05/10/2021  This Visit's Progress: On track  Recent Progress: On track  Priority: High  Note:   Current Barriers:   Patient with Caregiver Stress, Osteoporosis, Osteoarthritis, Chronic Pain, Hypertension, Unsteady Gait and Degenerative Disc Disease needs Support, Education, and Care Coordination to resolve unmet personal care needs in the home. Patient is unable to consistently perform ADL's/IADL's independently. Patient requires assistance with ambulation. Financial constraints related to inability to pay for in-home care services out-of-pocket. Patient is not eligible to receive funding for in-home care services through Adult Medicaid with the Department of Social Services, due to exceeding income guidelines. Level of care concerns, for patient and 85 year old mentally disabled son, but refuses placement, of any kind, or to attend an Adult Day Care Programs. Patient is primary caregiver for 85 year old mentally disabled son. Lacks knowledge of available community agencies and resources. Clinical Goals:  Over the next 45 to 60 days, patient will have in-home care services in place,  either through Rosealee Albee, Education officer, museum II with the [place] [facility], or through a private agency of choice. Patient will work with LCSW and Rosealee Albee, Social Worker II with the [place] [facility], to coordinate care for in-home aide services. Patient will receive, review and consider arranging in-home care services through a private agency of choice, from the list mailed to her by LCSW, and notify LCSW if assistance is needed with the  referral process or with application completion and/or submission.   Patient will attend all scheduled medical appointments as evidenced by patient report and care team review of appointment completion in electronic medical record. Patient will demonstrate improved health management independence as evidenced by having in-home care services in place. Clinical Interventions: Patient interviewed and appropriate assessments performed. Collaboration with patient regarding development and update of comprehensive plan of care as evidenced by provider attestation and co-signature. Inter-disciplinary care team collaboration (see longitudinal plan of care). Interventions performed:  Problem Solving/Task Centered, Psychoeducation/Health Education, Quality of Sleep Assessed and Sleep Hygiene Techniques Promoted, Caregiver Stress Acknowledged and Consideration of In-Home Care Services Encouraged. Referral placed to Rosealee Albee, Education officer, museum II with the [place] [facility]. Explained to patient that applications for the [employer] are screened by a Education officer, museum, through the [place], over the phone, and that client's are assessed at 3 different levels to determine what their needs are, whether home management or personal care services.   LCSW further explained to patient, that based on the level they are  assessed, she can receive between 4-10 hours of in-home care services per week and that the number of hours determines the number of days she will receive services during that week.  Individuals can be disabled or aged, but cannot receive Adult Medicaid, Veteran Aide and Attendance Benefits, or any other services that would qualify them where in-home aide services are available.   Discussed plans with patient for ongoing care management follow-up and provided direct contact information for care management team. Assisted patient with obtaining information about health plan benefits through [employer]. Provided education to patient regarding level of care options. Assessed needs, level of care concerns, basic eligibility and provided education on [employer] process. LCSW collaboration with Rosealee Albee, Social Worker II with the [place] [facility], to verify application is received and processed. Identified resources and durable medical equipment needed in the home to improve safety and promote independence. Patient Goals/Self-Care Activities:  Follow-up with Rosealee Albee, Social Worker II with the [facility], to check the status of your application process. LCSW has placed you on the waiting list for the [facility]. Work with LCSW on a weekly basis until approved for [employer], or until in-home care services are in place through a private agency of choice. Continue to review list of private agencies providing in-home care services, mailed to you by LCSW, and be prepared to provide LCSW with at least 4 agencies of interest, during our next scheduled telephone outreach. Follow-Up Plan: LCSW will follow-up with patient by telephone on 04/06/2021 at 10:00am.      Follow-Up Plan: LCSW will follow-up with patient by phone on 04/06/2021 at 10:00am.      Nat Christen LCSW Licensed Clinical Social Worker [place]  [phone]

## 2021-03-27 ENCOUNTER — Other Ambulatory Visit: Payer: Self-pay | Admitting: *Deleted

## 2021-03-27 DIAGNOSIS — D500 Iron deficiency anemia secondary to blood loss (chronic): Secondary | ICD-10-CM

## 2021-03-28 ENCOUNTER — Inpatient Hospital Stay: Payer: PPO | Admitting: Internal Medicine

## 2021-03-28 ENCOUNTER — Inpatient Hospital Stay: Payer: PPO

## 2021-03-28 ENCOUNTER — Encounter: Payer: Self-pay | Admitting: Internal Medicine

## 2021-03-28 ENCOUNTER — Inpatient Hospital Stay: Payer: PPO | Attending: Internal Medicine

## 2021-03-28 ENCOUNTER — Other Ambulatory Visit: Payer: Self-pay

## 2021-03-28 VITALS — BP 134/50 | HR 58 | Resp 18

## 2021-03-28 DIAGNOSIS — I509 Heart failure, unspecified: Secondary | ICD-10-CM | POA: Diagnosis not present

## 2021-03-28 DIAGNOSIS — D500 Iron deficiency anemia secondary to blood loss (chronic): Secondary | ICD-10-CM | POA: Insufficient documentation

## 2021-03-28 DIAGNOSIS — D649 Anemia, unspecified: Secondary | ICD-10-CM

## 2021-03-28 DIAGNOSIS — M1990 Unspecified osteoarthritis, unspecified site: Secondary | ICD-10-CM | POA: Insufficient documentation

## 2021-03-28 DIAGNOSIS — G4733 Obstructive sleep apnea (adult) (pediatric): Secondary | ICD-10-CM | POA: Diagnosis not present

## 2021-03-28 DIAGNOSIS — Z881 Allergy status to other antibiotic agents status: Secondary | ICD-10-CM | POA: Diagnosis not present

## 2021-03-28 DIAGNOSIS — Z8249 Family history of ischemic heart disease and other diseases of the circulatory system: Secondary | ICD-10-CM | POA: Insufficient documentation

## 2021-03-28 DIAGNOSIS — M2550 Pain in unspecified joint: Secondary | ICD-10-CM | POA: Diagnosis not present

## 2021-03-28 DIAGNOSIS — Z882 Allergy status to sulfonamides status: Secondary | ICD-10-CM | POA: Diagnosis not present

## 2021-03-28 DIAGNOSIS — Z823 Family history of stroke: Secondary | ICD-10-CM | POA: Insufficient documentation

## 2021-03-28 DIAGNOSIS — Z86718 Personal history of other venous thrombosis and embolism: Secondary | ICD-10-CM | POA: Diagnosis not present

## 2021-03-28 DIAGNOSIS — E039 Hypothyroidism, unspecified: Secondary | ICD-10-CM | POA: Insufficient documentation

## 2021-03-28 DIAGNOSIS — Z888 Allergy status to other drugs, medicaments and biological substances status: Secondary | ICD-10-CM | POA: Insufficient documentation

## 2021-03-28 DIAGNOSIS — Z8616 Personal history of COVID-19: Secondary | ICD-10-CM | POA: Insufficient documentation

## 2021-03-28 DIAGNOSIS — Z9049 Acquired absence of other specified parts of digestive tract: Secondary | ICD-10-CM | POA: Diagnosis not present

## 2021-03-28 DIAGNOSIS — Z8719 Personal history of other diseases of the digestive system: Secondary | ICD-10-CM | POA: Insufficient documentation

## 2021-03-28 DIAGNOSIS — Z79899 Other long term (current) drug therapy: Secondary | ICD-10-CM | POA: Diagnosis not present

## 2021-03-28 DIAGNOSIS — I272 Pulmonary hypertension, unspecified: Secondary | ICD-10-CM | POA: Insufficient documentation

## 2021-03-28 DIAGNOSIS — R5383 Other fatigue: Secondary | ICD-10-CM | POA: Diagnosis not present

## 2021-03-28 DIAGNOSIS — M549 Dorsalgia, unspecified: Secondary | ICD-10-CM | POA: Diagnosis not present

## 2021-03-28 DIAGNOSIS — I110 Hypertensive heart disease with heart failure: Secondary | ICD-10-CM | POA: Insufficient documentation

## 2021-03-28 LAB — BASIC METABOLIC PANEL
Anion gap: 8 (ref 5–15)
BUN: 25 mg/dL — ABNORMAL HIGH (ref 8–23)
CO2: 27 mmol/L (ref 22–32)
Calcium: 9.2 mg/dL (ref 8.9–10.3)
Chloride: 101 mmol/L (ref 98–111)
Creatinine, Ser: 1 mg/dL (ref 0.44–1.00)
GFR, Estimated: 54 mL/min — ABNORMAL LOW (ref >60)
Glucose, Bld: 122 mg/dL — ABNORMAL HIGH (ref 70–99)
Potassium: 4.3 mmol/L (ref 3.5–5.1)
Sodium: 136 mmol/L (ref 135–145)

## 2021-03-28 LAB — CBC WITH DIFFERENTIAL/PLATELET
Abs Immature Granulocytes: 0.03 10*3/uL (ref 0.00–0.07)
Basophils Absolute: 0 10*3/uL (ref 0.0–0.1)
Basophils Relative: 0 %
Eosinophils Absolute: 0.1 10*3/uL (ref 0.0–0.5)
Eosinophils Relative: 1 %
HCT: 33.7 % — ABNORMAL LOW (ref 36.0–46.0)
Hemoglobin: 10.7 g/dL — ABNORMAL LOW (ref 12.0–15.0)
Immature Granulocytes: 0 %
Lymphocytes Relative: 27 %
Lymphs Abs: 2.3 10*3/uL (ref 0.7–4.0)
MCH: 32.6 pg (ref 26.0–34.0)
MCHC: 31.8 g/dL (ref 30.0–36.0)
MCV: 102.7 fL — ABNORMAL HIGH (ref 80.0–100.0)
Monocytes Absolute: 0.6 10*3/uL (ref 0.1–1.0)
Monocytes Relative: 8 %
Neutro Abs: 5.4 10*3/uL (ref 1.7–7.7)
Neutrophils Relative %: 64 %
Platelets: 149 10*3/uL — ABNORMAL LOW (ref 150–400)
RBC: 3.28 MIL/uL — ABNORMAL LOW (ref 3.87–5.11)
RDW: 13.5 % (ref 11.5–15.5)
WBC: 8.4 10*3/uL (ref 4.0–10.5)
nRBC: 0 % (ref 0.0–0.2)

## 2021-03-28 MED ORDER — IRON SUCROSE 20 MG/ML IV SOLN
200.0000 mg | Freq: Once | INTRAVENOUS | Status: AC
  Administered 2021-03-28: 200 mg via INTRAVENOUS
  Filled 2021-03-28: qty 10

## 2021-03-28 MED ORDER — SODIUM CHLORIDE 0.9 % IV SOLN: 200.0000 mg | Freq: Once | INTRAVENOUS | Status: DC

## 2021-03-28 MED ORDER — SODIUM CHLORIDE 0.9 % IV SOLN
Freq: Once | INTRAVENOUS | Status: AC
  Filled 2021-03-28: qty 250

## 2021-03-28 NOTE — Progress Notes (Signed)
[place] NOTE  Patient Care Team: Einar Pheasant, [name], [name], [name], [name], [name], [name] [place], Maree Erie, LCSW as Social Worker (Licensed Clinical Social Worker)  CHIEF COMPLAINTS/PURPOSE OF CONSULTATION: Anemia  # CHRONIC MILD ANEMIA hb ~11.[Previous Dr.Gittin pt]; [place] 2017- work up [place] [employer]; stool card; Iron/ monoclonal work up- NEGATIVE]   # CHRONIC INTERSTITIAL CYSTITIS/ wheel chair bound  Oncology History   No history exists.     HISTORY OF PRESENTING ILLNESS:  Kimberly Roy 87 y.o.  female  with a history of chronic anemia- is here for a follow up.   Patient complains of mild to moderate fatigue.  Denies any blood in stools or black-colored stools.  No nausea no vomiting.  No abdominal pain.  No easy bleeding/bruising noted.   Review of Systems  Constitutional:  Positive for malaise/fatigue. Negative for chills, diaphoresis and fever.  HENT:  Negative for nosebleeds and sore throat.   Eyes:  Negative for double vision.  Respiratory:  Negative for cough, hemoptysis, sputum production, shortness of breath and wheezing.   Cardiovascular:  Negative for chest pain, palpitations, orthopnea and leg swelling.  Gastrointestinal:  Negative for abdominal pain, blood in stool, constipation, diarrhea, heartburn, melena, nausea and vomiting.  Genitourinary:  Negative for dysuria, frequency and urgency.  Musculoskeletal:  Positive for back pain and joint pain.  Skin: Negative.  Negative for itching and rash.  Neurological:  Negative for dizziness, tingling, focal weakness, weakness and headaches.  Endo/Heme/Allergies:  Does not bruise/bleed easily.  Psychiatric/Behavioral:  Negative for depression. The patient is not  nervous/anxious and does not have insomnia.     MEDICAL HISTORY:  Past Medical History:  Diagnosis Date   Anemia    Anxiety    Chest pain    CHF (congestive heart failure) (HCC)    Constipation    DDD (degenerative disc disease), cervical    Depression    DVT (deep venous thrombosis) (HCC)    Dysphonia    Dyspnea    Fatty liver    Fatty liver    Headache    Hyperlipidemia    Hyperpiesia    Hypertension    Hypothyroidism    Interstitial cystitis    Left ventricular dysfunction    Lymphedema    Nephrolithiasis    Obstructive sleep apnea    Osteoarthritis    knees/cervical and lumbar spine   Pulmonary hypertension (HCC)    Pulmonary nodules    followed by Dr Raul Del   Pure hypercholesterolemia    Renal cyst    right    SURGICAL HISTORY: Past Surgical History:  Procedure Laterality Date   ABDOMINAL HYSTERECTOMY     ovaries left in place   APPENDECTOMY     Back Surgeries     BACK SURGERY     BREAST REDUCTION SURGERY     3/99   CARDIAC CATHETERIZATION     cataracts Bilateral    CERVICAL SPINE SURGERY     ESOPHAGEAL MANOMETRY N/A 08/02/2015   Procedure: ESOPHAGEAL MANOMETRY (EM);  Surgeon: Josefine Class, MD;  Location: [facility] ENDOSCOPY;  Service: Endoscopy;  Laterality: N/A;   ESOPHAGOGASTRODUODENOSCOPY N/A 02/27/2015   Procedure: ESOPHAGOGASTRODUODENOSCOPY (EGD);  Surgeon: Hulen Luster, MD;  Location: [facility] ENDOSCOPY;  Service: Gastroenterology;  Laterality: N/A;   ESOPHAGOGASTRODUODENOSCOPY (EGD) WITH PROPOFOL N/A  10/30/2018   Procedure: ESOPHAGOGASTRODUODENOSCOPY (EGD) WITH PROPOFOL;  Surgeon: Lollie Sails, MD;  Location: [facility] ENDOSCOPY;  Service: Endoscopy;  Laterality: N/A;   EXCISIONAL HEMORRHOIDECTOMY     EYE SURGERY     FRACTURE SURGERY     HEMORRHOID SURGERY     HIP SURGERY  2013   Right hip surgery   JOINT REPLACEMENT     KNEE ARTHROSCOPY     left and right   ORIF FEMUR FRACTURE Right 08/07/2018   Procedure: OPEN REDUCTION INTERNAL FIXATION  (ORIF) DISTAL FEMUR FRACTURE;  Surgeon: Hessie Knows, MD;  Location: [facility] ORS;  Service: Orthopedics;  Laterality: Right;   REDUCTION MAMMAPLASTY Bilateral YRS AGO   REPLACEMENT TOTAL KNEE Bilateral    rotator cuff surgery     blilateral   TONSILECTOMY/ADENOIDECTOMY WITH MYRINGOTOMY     VISCERAL ARTERY INTERVENTION N/A 02/25/2019   Procedure: VISCERAL ARTERY INTERVENTION;  Surgeon: Algernon Huxley, MD;  Location: [place] CV LAB;  Service: Cardiovascular;  Laterality: N/A;    SOCIAL HISTORY: Social History   Socioeconomic History   Marital status: Widowed    Spouse name: Not on file   Number of children: 2   Years of education: 70   Highest education level: 12th grade  Occupational History    Comment: insurance agency  Tobacco Use   Smoking status: Never   Smokeless tobacco: Never  Vaping Use   Vaping Use: Never used  Substance and Sexual Activity   Alcohol use: No    Alcohol/week: 0.0 standard drinks   Drug use: Never   Sexual activity: Not Currently  Other Topics Concern   Not on file  Social History Narrative   Not on file   Social Determinants of Health   Financial Resource Strain: Low Risk    Difficulty of Paying Living Expenses: Not hard at all  Food Insecurity: No Food Insecurity   Worried About Charity fundraiser in the Last Year: Never true   [place] in the Last Year: Never true  Transportation Needs: No Transportation Needs   Lack of Transportation (Medical): No   Lack of Transportation (Non-Medical): No  Physical Activity: Inactive   Days of Exercise per Week: 0 days   Minutes of Exercise per Session: 0 min  Stress: No Stress Concern Present   Feeling of Stress : Only a little  Social Connections: Socially Isolated   Frequency of Communication with Friends and Family: Three times a week   Frequency of Social Gatherings with Friends and Family: Twice a week   Attends Religious Services: Never   Marine scientist or Organizations: No    Attends Archivist Meetings: Never   Marital Status: Widowed  Human resources officer Violence: Not At Risk   Fear of Current or Ex-Partner: No   Emotionally Abused: No   Physically Abused: No   Sexually Abused: No    FAMILY HISTORY: Family History  Problem Relation Age of Onset   Heart disease Mother    Stroke Mother    Hypertension Mother    Heart disease Father        myocardial infarction age 104   Breast cancer Neg Hx     ALLERGIES:  is allergic to lyrica [pregabalin], omnicef [cefdinir], atarax [hydroxyzine], dicyclomine, hydroxyzine hcl, levaquin [levofloxacin], nitrofurantoin, nucynta er [tapentadol hcl er], oxybutynin, zoloft [sertraline hcl], biaxin [clarithromycin], sertraline, sulfa antibiotics, sulfasalazine, tape, and tapentadol.  MEDICATIONS:  Current Outpatient Medications  Medication Sig Dispense Refill   albuterol (  VENTOLIN HFA) 108 (90 Base) MCG/ACT inhaler INHALE 2 PUFFS INTO THE LUNGS EVERY 6 (SIX) HOURS AS NEEDED FOR WHEEZING OR SHORTNESS OF BREATH 18 g 0   aluminum-magnesium hydroxide-simethicone (MAALOX) 782-423-53 MG/5ML SUSP Take 15 mLs by mouth 4 (four) times daily -  before meals and at bedtime.      amLODipine (NORVASC) 10 MG tablet TAKE 1 TABLET BY MOUTH DAILY 90 tablet 1   aspirin 81 MG EC tablet Take by mouth.     atorvastatin (LIPITOR) 20 MG tablet Take 1 tablet (20 mg total) by mouth at bedtime. 90 tablet 1   Azelastine HCl 137 MCG/SPRAY SOLN PLACE 1 SPRAY INTO BOTH NOSTRILS TWO TIMES DAILY 30 mL 1   benazepril (LOTENSIN) 40 MG tablet TAKE 1 TABLET BY MOUTH DAILY 90 tablet 1   betamethasone dipropionate 0.05 % cream Apply 1 application topically 2 (two) times daily.     budesonide-formoterol (SYMBICORT) 80-4.5 MCG/ACT inhaler Inhale 2 puffs into the lungs 2 (two) times daily. 1 each 12   busPIRone (BUSPAR) 10 MG tablet Take 10 mg by mouth 2 (two) times daily.      calcium citrate-vitamin D (CITRACAL+D) 315-200 MG-UNIT tablet Take 1 tablet by  mouth daily.     cephALEXin (KEFLEX) 250 MG capsule Take 250 mg by mouth daily.     Cholecalciferol (VITAMIN D3) 1000 UNITS CAPS Take 1 capsule by mouth daily.     Diclofenac Sodium 3 % GEL Apply topically 2 (two) times daily.     Docusate Sodium (DSS) 100 MG CAPS Take by mouth.     DULoxetine (CYMBALTA) 60 MG capsule TAKE 1 CAPSULE BY MOUTH EVERY DAY. 30 capsule 3   ELMIRON 100 MG capsule Take 100 mg by mouth 3 (three) times daily.     fluocinonide (LIDEX) 0.05 % external solution Apply topically.     furosemide (LASIX) 20 MG tablet Take 1 tablet (20 mg total) by mouth daily as needed. 90 tablet 0   gabapentin (NEURONTIN) 300 MG capsule TAKE 2 CAPSULES BY MOUTH 3 TIMES A DAY. 540 capsule 2   HYDROcodone-acetaminophen (NORCO) 10-325 MG tablet Take 1 tablet by mouth 3 (three) times daily. 10 tablet 0   hydrOXYzine (ATARAX/VISTARIL) 25 MG tablet Take 25 mg by mouth daily.     ketoconazole (NIZORAL) 2 % shampoo Apply 1 application topically 2 (two) times a week.     magnesium oxide (MAG-OX) 400 MG tablet Take 1 tablet (400 mg total) by mouth daily. 30 tablet 1   Melatonin 10 MG CAPS Take 1 capsule by mouth.     methocarbamol (ROBAXIN) 500 MG tablet Take 500 mg by mouth 3 (three) times daily. As needed     metoprolol succinate (TOPROL-XL) 50 MG 24 hr tablet Take 1 tablet (50 mg total) by mouth daily. Take with or immediately following a meal. 90 tablet 1   montelukast (SINGULAIR) 10 MG tablet 10 mg once daily as needed     morphine (MS CONTIN) 60 MG 12 hr tablet Take 1 tablet (60 mg total) by mouth 2 (two) times daily. 10 tablet 0   mupirocin ointment (BACTROBAN) 2 % Apply 1 application topically 2 (two) times daily. 22 g 0   NYAMYC powder Apply topically.     polyethylene glycol powder (GLYCOLAX/MIRALAX) 17 GM/SCOOP powder MIX 17 GRAMS AS MARKED ON BOTTLE TOP IN 8 OUNCES OF WATER AND DRINK ONCE A DAY AS DIRECTED. 527 g 0   RABEprazole (ACIPHEX) 20 MG tablet TAKE 1 TABLET BY  MOUTH TWICE A DAY  BEFORE A MEAL. 180 tablet 1   senna (SENOKOT) 8.6 MG TABS tablet Take 1 tablet by mouth 2 (two) times daily.     Simethicone (GAS-X PO) Take 2 tablets by mouth 2 (two) times daily.     sodium chloride (OCEAN) 0.65 % nasal spray Place 1 spray into the nose as needed.     SYNTHROID 100 MCG tablet Take 1 tablet (100 mcg total) by mouth daily. 90 tablet 3   traZODone (DESYREL) 50 MG tablet Take 25-50 mg by mouth at bedtime as needed.     triamcinolone ointment (KENALOG) 0.1 % SMARTSIG:1 Application Topical 2x/day 30 g 0   Naldemedine Tosylate 0.2 MG TABS Take 0.2 mg by mouth daily.     No current facility-administered medications for this visit.      Marland Kitchen  PHYSICAL EXAMINATION: ECOG PERFORMANCE STATUS: 0 - Asymptomatic  Vitals:   03/28/21 1422  BP: (!) 124/46  Pulse: (!) 57  Resp: 16  Temp: 98.6 F (37 C)  SpO2: 98%   Filed Weights   03/28/21 1422  Weight: 175 lb (79.4 kg)    Physical Exam Constitutional:      Comments: Patient in wheelchair because of chronic arthritic problems.  HENT:     Head: Normocephalic and atraumatic.     Mouth/Throat:     Pharynx: No oropharyngeal exudate.  Eyes:     Pupils: Pupils are equal, round, and reactive to light.  Cardiovascular:     Rate and Rhythm: Normal rate and regular rhythm.  Pulmonary:     Effort: Pulmonary effort is normal. No respiratory distress.     Breath sounds: Normal breath sounds. No wheezing.  Abdominal:     General: Bowel sounds are normal. There is no distension.     Palpations: Abdomen is soft. There is no mass.     Tenderness: no abdominal tenderness There is no guarding or rebound.  Musculoskeletal:        General: No tenderness. Normal range of motion.     Cervical back: Normal range of motion and neck supple.  Skin:    General: Skin is warm.  Neurological:     Mental Status: She is alert and oriented to person, place, and time.  Psychiatric:        Mood and Affect: Affect normal.    LABORATORY DATA:  I  have reviewed the data as listed Lab Results  Component Value Date   WBC 8.4 03/28/2021   HGB 10.7 (L) 03/28/2021   HCT 33.7 (L) 03/28/2021   MCV 102.7 (H) 03/28/2021   PLT 149 (L) 03/28/2021   Recent Labs    08/11/20 1226 10/17/20 1253 10/31/20 0949 12/27/20 1256 01/24/21 1656 03/28/21 1405  NA 139 140 140 138 139 136  K 4.1 4.7 4.8 3.8 4.2 4.3  CL 100 101 101 100 98 101  CO2 31 34* 34* _0 GLUCOSE 83 95 87 177* 96 122*  BUN 23 19 24* 25* 18 25*  CREATININE 0.99 0.92 1.00 1.03* 0.84 1.00  CALCIUM 9.3 10.0 10.0 9.6 9.9 9.2  GFRNONAA  --   --   --  53* >60 54*  PROT 6.3 6.5 6.2  --  6.8  --   ALBUMIN 3.9 3.9 3.9  --  4.0  --   AST _1 --  20  --   ALT _2 --  13  --   ALKPHOS 84 91  83  --  86  --   BILITOT 0.5 0.5 0.5  --  0.6  --   BILIDIR 0.1 0.1 0.1  --   --   --     RADIOGRAPHIC STUDIES: I have personally reviewed the radiological images as listed and agreed with the findings in the report. MM 3D SCREEN BREAST BILATERAL  Result Date: 03/12/2021 CLINICAL DATA:  Screening. EXAM: DIGITAL SCREENING BILATERAL MAMMOGRAM WITH TOMOSYNTHESIS AND CAD TECHNIQUE: Bilateral screening digital craniocaudal and mediolateral oblique mammograms were obtained. Bilateral screening digital breast tomosynthesis was performed. The images were evaluated with computer-aided detection. COMPARISON:  Previous exam(s). ACR Breast Density Category b: There are scattered areas of fibroglandular density. FINDINGS: There are no findings suspicious for malignancy. IMPRESSION: No mammographic evidence of malignancy. A result letter of this screening mammogram will be mailed directly to the patient. RECOMMENDATION: Screening mammogram in one year. (Code:SM-B-01Y) BI-RADS CATEGORY  1: Negative. Electronically Signed   By: Margarette Canada M.D.   On: 03/12/2021 12:58   ASSESSMENT & PLAN:   Anemia due to blood loss, chronic     Symptomatic anemia #  Chronic anemia-unclear etiology-   Mild  symptoms of ongoing fatigue.  Hemoglobin today is 10.5. April 2022-given the clinical response to IV Venofer continue Venofer today.  See discussion below  Ref. Range 12/27/2020 12:56  Iron Latest Ref Range: 28 - 170 ug/dL 77  UIBC Latest Units: ug/dL 232  TIBC Latest Ref Range: 250 - 450 ug/dL 309  Saturation Ratios Latest Ref Range: 10.4 - 31.8 % 25  Ferritin Latest Ref Range: 11 - 307 ng/mL 42   # ETIOLOGY anemia unclear- Long discussion re: possibility MDS-discussed re:  bone marrow biopsy- discussed pro and cons.  Patient is reluctant at this time with invasive procedure especially as this will not immediately alter her care.  Discussed that patient could be candidate for Retacrit if significant drop in hemoglobin noted.  [h-stroke/covid-died/d-long COVID].  Currently patient has poor social support/caregiver. Given the fact that patient's hemoglobin is above 10/and as this will not change her care immediately-I think is reasonable to wait on the bone marrow biopsy especially given patient preference.   # DISPOSITION:  # venofer today # follow up in 3 months- MD; labs-cbc/bmp;possible venofer-Dr.B    Cammie Sickle, MD 03/28/2021 7:58 PM

## 2021-03-28 NOTE — Assessment & Plan Note (Signed)
#  Chronic anemia-unclear etiology-   Mild symptoms of ongoing fatigue.  Hemoglobin today is 10.5. April 2022-given the clinical response to IV Venofer continue Venofer today.  See discussion below  Ref. Range 12/27/2020 12:56  Iron Latest Ref Range: 28 - 170 ug/dL 77  UIBC Latest Units: ug/dL 232  TIBC Latest Ref Range: 250 - 450 ug/dL 309  Saturation Ratios Latest Ref Range: 10.4 - 31.8 % 25  Ferritin Latest Ref Range: 11 - 307 ng/mL 42   # ETIOLOGY anemia unclear- Long discussion re: possibility MDS-discussed re:  bone marrow biopsy- discussed pro and cons.  Patient is reluctant at this time with invasive procedure especially as this will not immediately alter her care.  Discussed that patient could be candidate for Retacrit if significant drop in hemoglobin noted.  [h-stroke/covid-died/d-long COVID].  Currently patient has poor social support/caregiver. Given the fact that patient's hemoglobin is above 10/and as this will not change her care immediately-I think is reasonable to wait on the bone marrow biopsy especially given patient preference.   # DISPOSITION:  # venofer today # follow up in 3 months- MD; labs-cbc/bmp;possible venofer-Dr.B

## 2021-03-28 NOTE — Patient Instructions (Signed)
[facility] ONCOL[facility]   Discharge Instructions: Thank you for choosing [facility] to provide your oncology and hematology care.  If you have a lab appointment with the [facility], please go directly to the [facility] and check in at the registration area.  We strive to give you quality time with your provider. You may need to reschedule your appointment if you arrive late (15 or more minutes).  Arriving late affects you and other patients whose appointments are after yours.  Also, if you miss three or more appointments without notifying the office, you may be dismissed from the clinic at the provider's discretion.      For prescription refill requests, have your pharmacy contact our office and allow 72 hours for refills to be completed.    Today you received the following: Venofer.      BELOW ARE SYMPTOMS THAT SHOULD BE REPORTED IMMEDIATELY: *FEVER GREATER THAN 100.4 F (38 C) OR HIGHER *CHILLS OR SWEATING *NAUSEA AND VOMITING THAT IS NOT CONTROLLED WITH YOUR NAUSEA MEDICATION *UNUSUAL SHORTNESS OF BREATH *UNUSUAL BRUISING OR BLEEDING *URINARY PROBLEMS (pain or burning when urinating, or frequent urination) *BOWEL PROBLEMS (unusual diarrhea, constipation, pain near the anus) TENDERNESS IN MOUTH AND THROAT WITH OR WITHOUT PRESENCE OF ULCERS (sore throat, sores in mouth, or a toothache) UNUSUAL RASH, SWELLING OR PAIN  UNUSUAL VAGINAL DISCHARGE OR ITCHING   Items with * indicate a potential emergency and should be followed up as soon as possible or go to the Emergency Department if any problems should occur.  Should you have questions after your visit or need to cancel or reschedule your appointment, please contact [facility]  [phone] and follow the prompts.  Office hours are 8:00 a.m. to 4:30 p.m. Monday - Friday. Please note that voicemails left after 4:00 p.m. may not be returned until the following  business day.  We are closed weekends and major holidays. You have access to a nurse at all times for urgent questions. Please call the main number to the clinic [phone] and follow the prompts.  For any non-urgent questions, you may also contact your provider using MyChart. We now offer e-Visits for anyone 9 and older to request care online for non-urgent symptoms. For details visit [email or web address].   Also download the MyChart app! Go to the app store, search "MyChart", open the app, select [facility], and log in with your MyChart username and password.  Due to Covid, a mask is required upon entering the hospital/clinic. If you do not have a mask, one will be given to you upon arrival. For doctor visits, patients may have 1 support person aged 48 or older with them. For treatment visits, patients cannot have anyone with them due to current Covid guidelines and our immunocompromised population.

## 2021-03-30 ENCOUNTER — Ambulatory Visit: Payer: PPO | Admitting: *Deleted

## 2021-03-30 DIAGNOSIS — I872 Venous insufficiency (chronic) (peripheral): Secondary | ICD-10-CM

## 2021-03-30 DIAGNOSIS — R350 Frequency of micturition: Secondary | ICD-10-CM

## 2021-03-30 DIAGNOSIS — M25551 Pain in right hip: Secondary | ICD-10-CM

## 2021-03-30 DIAGNOSIS — I10 Essential (primary) hypertension: Secondary | ICD-10-CM

## 2021-03-30 NOTE — Chronic Care Management (AMB) (Signed)
  Care Management   Follow Up Note   03/30/2021 Name: AKYAH LAGRANGE MRN: [id number] DOB: 11-17-1930   Referred by: Einar Pheasant, MD Reason for referral : Chronic Care Management (HTN, Chronic Pain)   Successful telephone outreach to patient.  Stated she was in car at the time and requested call back at another time.  Follow Up Plan: The care management team will reach out to the patient again over the next 30 business days per patient request.   Hubert Azure RN, MSN RN Care Management Coordinator [facility] [phone] [email or web address]

## 2021-04-06 ENCOUNTER — Ambulatory Visit (INDEPENDENT_AMBULATORY_CARE_PROVIDER_SITE_OTHER): Payer: PPO | Admitting: *Deleted

## 2021-04-06 DIAGNOSIS — I10 Essential (primary) hypertension: Secondary | ICD-10-CM

## 2021-04-06 DIAGNOSIS — R2681 Unsteadiness on feet: Secondary | ICD-10-CM

## 2021-04-06 DIAGNOSIS — M810 Age-related osteoporosis without current pathological fracture: Secondary | ICD-10-CM

## 2021-04-06 DIAGNOSIS — R5383 Other fatigue: Secondary | ICD-10-CM

## 2021-04-06 DIAGNOSIS — M1990 Unspecified osteoarthritis, unspecified site: Secondary | ICD-10-CM

## 2021-04-06 DIAGNOSIS — F439 Reaction to severe stress, unspecified: Secondary | ICD-10-CM

## 2021-04-06 DIAGNOSIS — I509 Heart failure, unspecified: Secondary | ICD-10-CM

## 2021-04-06 NOTE — Patient Instructions (Signed)
Visit Information  PATIENT GOALS:  Goals Addressed               This Visit's Progress     COMPLETED: Maintain My Quality of Life through [employer]. (pt-stated)   On track     Timeframe:  Short-Term Goal Priority:  High Start Date:  03/09/2021                         Expected End Date:  04/06/2021             Follow-Up Date:  No Follow-Up Required Per Patient.   Patient Goals/Self-Care Activities:  Follow-up with Rosealee Albee, Social Worker II with the [facility], to periodically check the status of your application for services. Continue to receive long-term in-home care assistance for your husband two days per week, as well as assistance from your cousin two days per week. Contact LCSW directly (# [phone]) if you change your mind about wanting to pursue additional in-home care agencies/services, or if additional social work needs are identified in the near future. Consider self-enrollment into a caregiver support group.         Patient verbalizes understanding of instructions provided today and agrees to view in [facility].   No Follow-Up Required Per Patient.  Nat Christen LCSW Licensed Clinical Social Worker [place]  [phone]

## 2021-04-06 NOTE — Chronic Care Management (AMB) (Signed)
Chronic Care Management    Clinical Social Work Note  04/06/2021 Name: Kimberly Roy MRN: [id number] DOB: 1930/12/03  Kimberly Roy is a 87 y.o. year old female who is a primary care patient of Einar Pheasant, MD. The CCM team was consulted to assist the patient with chronic disease management and/or care coordination needs related to: Level of Care Concerns.   Engaged with patient by telephone for follow-up visit in response to provider referral for social work chronic care management and care coordination services.   Consent to Services:  The patient was given information about Chronic Care Management services, agreed to services, and gave verbal consent prior to initiation of services.  Please see initial visit note for detailed documentation.   Patient agreed to services and consent obtained.   Assessment: Review of patient past medical history, allergies, medications, and health status, including review of relevant consultants reports was performed today as part of a comprehensive evaluation and provision of chronic care management and care coordination services.     SDOH (Social Determinants of Health) assessments and interventions performed:    Advanced Directives Status: Not addressed in this encounter.  CCM Care Plan  Allergies  Allergen Reactions   Lyrica [Pregabalin] Swelling   Omnicef [Cefdinir] Diarrhea and Nausea And Vomiting   Atarax [Hydroxyzine]     jittery   Dicyclomine Other (See Comments)    Abdominal bloating    Hydroxyzine Hcl     jittery   Levaquin [Levofloxacin] Swelling   Nitrofurantoin Diarrhea   Nucynta Er [Tapentadol Hcl Er] Other (See Comments)    Severe constipation    Oxybutynin Other (See Comments)    Blurred vision   Zoloft [Sertraline Hcl]     Severe headache   Biaxin [Clarithromycin] Other (See Comments) and Rash    Pt does not remember Pt does not remember   Sertraline Nausea And Vomiting    Severe headache Severe headache Other  reaction(s): Headache Severe headache   Sulfa Antibiotics Rash    Pt does not remember   Sulfasalazine Rash    Pt does not remember   Tape Rash    Durabond - redness   Tapentadol Other (See Comments) and Rash    _0     Outpatient Encounter Medications as of 04/06/2021  Medication Sig Note   albuterol (VENTOLIN HFA) 108 (90 Base) MCG/ACT inhaler INHALE 2 PUFFS INTO THE LUNGS EVERY 6 (SIX) HOURS AS NEEDED FOR WHEEZING OR SHORTNESS OF BREATH    aluminum-magnesium hydroxide-simethicone (MAALOX) 200-200-20 MG/5ML SUSP Take 15 mLs by mouth 4 (four) times daily -  before meals and at bedtime.  02/09/2021: Reports taking as needed   amLODipine (NORVASC) 10 MG tablet TAKE 1 TABLET BY MOUTH DAILY    aspirin 81 MG EC tablet Take by mouth.    atorvastatin (LIPITOR) 20 MG tablet Take 1 tablet (20 mg total) by mouth at bedtime.    Azelastine HCl 137 MCG/SPRAY SOLN PLACE 1 SPRAY INTO BOTH NOSTRILS TWO TIMES DAILY    benazepril (LOTENSIN) 40 MG tablet TAKE 1 TABLET BY MOUTH DAILY    betamethasone dipropionate 0.05 % cream Apply 1 application topically 2 (two) times daily. 02/09/2021: Reports uses as needed   budesonide-formoterol (SYMBICORT) 80-4.5 MCG/ACT inhaler Inhale 2 puffs into the lungs 2 (two) times daily. 02/09/2021: Reports not taking at this time   busPIRone (BUSPAR) 10 MG tablet Take 10 mg by mouth 2 (two) times daily.  calcium citrate-vitamin D (CITRACAL+D) 315-200 MG-UNIT tablet Take 1 tablet by mouth daily.    cephALEXin (KEFLEX) 250 MG capsule Take 250 mg by mouth daily.    Cholecalciferol (VITAMIN D3) 1000 UNITS CAPS Take 1 capsule by mouth daily.    Diclofenac Sodium 3 % GEL Apply topically 2 (two) times daily. 02/09/2021: As needed   Docusate Sodium (DSS) 100 MG CAPS Take by mouth.    DULoxetine (CYMBALTA) 60 MG capsule TAKE 1 CAPSULE BY MOUTH EVERY DAY.    ELMIRON 100 MG capsule Take 100  mg by mouth 3 (three) times daily.    fluocinonide (LIDEX) 0.05 % external solution Apply topically.    furosemide (LASIX) 20 MG tablet Take 1 tablet (20 mg total) by mouth daily as needed.    gabapentin (NEURONTIN) 300 MG capsule TAKE 2 CAPSULES BY MOUTH 3 TIMES A DAY. 02/09/2021: Reports taking twice a day   HYDROcodone-acetaminophen (NORCO) 10-325 MG tablet Take 1 tablet by mouth 3 (three) times daily.    hydrOXYzine (ATARAX/VISTARIL) 25 MG tablet Take 25 mg by mouth daily.    ketoconazole (NIZORAL) 2 % shampoo Apply 1 application topically 2 (two) times a week.    magnesium oxide (MAG-OX) 400 MG tablet Take 1 tablet (400 mg total) by mouth daily.    Melatonin 10 MG CAPS Take 1 capsule by mouth.    methocarbamol (ROBAXIN) 500 MG tablet Take 500 mg by mouth 3 (three) times daily. As needed    metoprolol succinate (TOPROL-XL) 50 MG 24 hr tablet Take 1 tablet (50 mg total) by mouth daily. Take with or immediately following a meal.    montelukast (SINGULAIR) 10 MG tablet 10 mg once daily as needed    morphine (MS CONTIN) 60 MG 12 hr tablet Take 1 tablet (60 mg total) by mouth 2 (two) times daily.    mupirocin ointment (BACTROBAN) 2 % Apply 1 application topically 2 (two) times daily.    Naldemedine Tosylate 0.2 MG TABS Take 0.2 mg by mouth daily.    NYAMYC powder Apply topically.    polyethylene glycol powder (GLYCOLAX/MIRALAX) 17 GM/SCOOP powder MIX 17 GRAMS AS MARKED ON BOTTLE TOP IN 8 OUNCES OF WATER AND DRINK ONCE A DAY AS DIRECTED.    RABEprazole (ACIPHEX) 20 MG tablet TAKE 1 TABLET BY MOUTH TWICE A DAY BEFORE A MEAL.    senna (SENOKOT) 8.6 MG TABS tablet Take 1 tablet by mouth 2 (two) times daily.    Simethicone (GAS-X PO) Take 2 tablets by mouth 2 (two) times daily.    sodium chloride (OCEAN) 0.65 % nasal spray Place 1 spray into the nose as needed.    SYNTHROID 100 MCG tablet Take 1 tablet (100 mcg total) by mouth daily.    traZODone (DESYREL) 50 MG tablet Take 25-50 mg by mouth at  bedtime as needed.    triamcinolone ointment (KENALOG) 0.1 % SMARTSIG:1 Application Topical 2x/day    No facility-administered encounter medications on file as of 04/06/2021.    Patient Active Problem List   Diagnosis Date Noted   Rash 03/03/2021   Skin lesion 03/03/2021   Aortic atherosclerosis ([place]) 11/20/2020   Bradycardia 06/08/2020   Itching 04/08/2020   Dyspnea 01/17/2020   Chronic respiratory failure with hypoxia ([place]) 01/17/2020   Right hip pain 01/10/2020   Leukocytosis 11/15/2019   Wound of buttock 10/25/2019   COVID-19 virus infection 10/04/2019   Urinary frequency 09/26/2019   Cold feeling 09/12/2019   Left knee pain 07/04/2019   Hemoptysis 06/30/2019  Femur fracture ([place]) 08/06/2018   Primary osteoarthritis of right shoulder 08/15/2017   Right shoulder pain 05/16/2017   Chronic venous insufficiency 02/10/2017   Lymphedema 02/10/2017   Neuropathy 12/25/2016   Anemia due to blood loss, chronic 08/07/2016   GERD (gastroesophageal reflux disease) 12/10/2015   Finger pain 11/13/2015   Carotid artery calcification 09/26/2015   External nasal lesion 09/25/2015   Muscle cramps 09/01/2015   Excessive sweating 07/30/2015   Headache 07/16/2015   Groin pain 05/14/2015   Muscle twitching 03/05/2015   Leg cramps 02/11/2015   Back pain 02/11/2015   Abdominal pain 02/11/2015   Chronic pain 01/19/2015   Acute cystitis without hematuria 12/12/2014   Left elbow pain 11/30/2014   Health care maintenance 11/30/2014   Osteoporosis 10/19/2014   Rectal bleeding 10/19/2014   Neck pain 09/03/2014   Unsteady gait 09/03/2014   Nocturia 09/03/2014   Dysphagia 06/01/2014   Nasal dryness 06/01/2014   Stress 06/01/2014   Fatigue 02/27/2014   Degenerative disc disease 12/21/2013   Pre-op evaluation 10/12/2013   Hoarseness 08/14/2013   Leg swelling 01/24/2013   CHF (congestive heart failure) ([place]) 12/29/2012   Cough 12/29/2012   OSA (obstructive sleep apnea) 07/09/2012    Osteoarthritis 07/04/2012   Symptomatic anemia 07/04/2012   Chronic constipation 07/04/2012   Pulmonary hypertension ([place]) 07/04/2012   Pulmonary nodules 07/04/2012   Hypertension 07/04/2012   Hyperlipidemia 07/04/2012   Hypothyroidism 07/04/2012    Conditions to be addressed/monitored:  Caregiver Stress.   Limited Social Support, Level of Care Concerns, Limited Access to Caregiver, and Lacks Knowledge of [employer].  Care Plan : LCSW Plan of Care  Updates made by Francis Gaines, LCSW since 04/06/2021 12:00 AM     Problem: Maintain My Quality of Life through [facility]. Resolved 04/06/2021  Priority: High     Goal: Maintain My Quality of Life through [facility]. Completed 04/06/2021  Start Date: 03/09/2021  Expected End Date: 04/06/2021  This Visit's Progress: On track  Recent Progress: On track  Priority: High  Note:   Current Barriers:   Patient with Caregiver Stress, Osteoporosis, Osteoarthritis, Chronic Pain, Hypertension, Unsteady Gait and Degenerative Disc Disease needs Support, Education, and Care Coordination to resolve unmet personal care needs in the home. Patient is unable to consistently perform ADL's/IADL's independently. Patient requires assistance with ambulation. Financial constraints related to inability to pay for in-home care services out-of-pocket. Patient is not eligible to receive funding for in-home care services through Adult Medicaid with the Department of Social Services, due to exceeding income guidelines. Level of care concerns, for patient and 85 year old mentally disabled son, but refuses placement, of any kind, or to attend an Adult Day Care Programs. Patient is primary caregiver for 85 year old mentally disabled son. Lacks knowledge of available community agencies and resources. Clinical Goals:  Over the next 45 to 60 days, patient will have in-home care services in place, either through Rosealee Albee, Education officer, museum II  with the [place], or through a private agency of choice. Patient will work with LCSW and Rosealee Albee, Social Worker II with the [facility] [facility], to coordinate care for in-home aide services. Patient will receive, review and consider arranging in-home care services through a private agency of choice, from the list mailed to her by LCSW, and notify LCSW if assistance is needed with the referral process or with application completion and/or submission.   Patient will  attend all scheduled medical appointments as evidenced by patient report and care team review of appointment completion in electronic medical record. Patient will demonstrate improved health management independence as evidenced by having in-home care services in place. Clinical Interventions: Patient interviewed and appropriate assessments performed. Collaboration with patient regarding development and update of comprehensive plan of care as evidenced by provider attestation and co-signature. Inter-disciplinary care team collaboration (see longitudinal plan of care). Interventions performed:  Problem Solving/Task Centered, Psychoeducation/Health Education, Quality of Sleep Assessed and Sleep Hygiene Techniques Promoted, Caregiver Stress Acknowledged and Consideration of In-Home Care Services Encouraged. Referral placed to Rosealee Albee, Education officer, museum II with the [facility]. Explained to patient that applications for the [employer] are screened by a Education officer, museum, through the [place], over the phone, and that client's are assessed at 3 different levels to determine what their needs are, whether home management or personal care services.   LCSW further explained to patient, that based on the level they are assessed, she can receive between 4-10 hours of  in-home care services per week and that the number of hours determines the number of days she will receive services during that week.  Individuals can be disabled or aged, but cannot receive Adult Medicaid, Veteran Aide and Attendance Benefits, or any other services that would qualify them where in-home aide services are available.   Discussed plans with patient for ongoing care management follow-up and provided direct contact information for care management team. Assisted patient with obtaining information about health plan benefits through [employer]. Provided education to patient regarding level of care options. Assessed needs, level of care concerns, basic eligibility and provided education on [employer] process. LCSW collaboration with Rosealee Albee, Social Worker II with the [facility] [facility], to verify application is received and processed. Identified resources and durable medical equipment needed in the home to improve safety and promote independence. Patient Goals/Self-Care Activities:  Follow-up with Rosealee Albee, Social Worker II with the [facility], to periodically check the status of your application for services. Continue to receive long-term in-home care assistance for your husband two days per week, as well as assistance from your cousin two days per week. Contact LCSW directly (# [phone]) if you change your mind about wanting to pursue additional in-home care agencies/services, or if additional social work needs are identified in the near future. Consider self-enrollment into a caregiver support group. Follow-Up Plan:  No Follow-Up Required Per Patient.      Follow-Up Plan:  No Follow-Up Required Per Patient.      Nat Christen LCSW Licensed Clinical Social Worker [place]  [phone]

## 2021-04-09 DIAGNOSIS — R1033 Periumbilical pain: Secondary | ICD-10-CM | POA: Diagnosis not present

## 2021-04-09 DIAGNOSIS — K5909 Other constipation: Secondary | ICD-10-CM | POA: Diagnosis not present

## 2021-04-11 ENCOUNTER — Ambulatory Visit: Payer: PPO | Admitting: *Deleted

## 2021-04-11 DIAGNOSIS — M542 Cervicalgia: Secondary | ICD-10-CM

## 2021-04-11 DIAGNOSIS — M1990 Unspecified osteoarthritis, unspecified site: Secondary | ICD-10-CM | POA: Diagnosis not present

## 2021-04-11 DIAGNOSIS — G8929 Other chronic pain: Secondary | ICD-10-CM

## 2021-04-11 DIAGNOSIS — I10 Essential (primary) hypertension: Secondary | ICD-10-CM

## 2021-04-11 DIAGNOSIS — M810 Age-related osteoporosis without current pathological fracture: Secondary | ICD-10-CM | POA: Diagnosis not present

## 2021-04-11 DIAGNOSIS — E785 Hyperlipidemia, unspecified: Secondary | ICD-10-CM

## 2021-04-11 DIAGNOSIS — I509 Heart failure, unspecified: Secondary | ICD-10-CM | POA: Diagnosis not present

## 2021-04-11 NOTE — Chronic Care Management (AMB) (Signed)
Chronic Care Management   CCM RN Visit Note  04/11/2021 Name: Kimberly Roy MRN: [id number] DOB: [birth year]  Subjective: Kimberly Roy is a 87 y.o. year old female who is a primary care patient of Kimberly Pheasant, MD. The care management team was consulted for assistance with disease management and care coordination needs.    Engaged with patient by telephone for follow up visit in response to provider referral for case management and/or care coordination services.   Consent to Services:  The patient was given information about Chronic Care Management services, agreed to services, and gave verbal consent prior to initiation of services.  Please see initial visit note for detailed documentation.   Patient agreed to services and verbal consent obtained.   Assessment: Review of patient past medical history, allergies, medications, health status, including review of consultants reports, laboratory and other test data, was performed as part of comprehensive evaluation and provision of chronic care management services.   SDOH (Social Determinants of Health) assessments and interventions performed:    CCM Care Plan  Allergies  Allergen Reactions   Lyrica [Pregabalin] Swelling   Omnicef [Cefdinir] Diarrhea and Nausea And Vomiting   Atarax [Hydroxyzine]     jittery   Dicyclomine Other (See Comments)    Abdominal bloating    Hydroxyzine Hcl     jittery   Levaquin [Levofloxacin] Swelling   Nitrofurantoin Diarrhea   Nucynta Er [Tapentadol Hcl Er] Other (See Comments)    Severe constipation    Oxybutynin Other (See Comments)    Blurred vision   Zoloft [Sertraline Hcl]     Severe headache   Biaxin [Clarithromycin] Other (See Comments) and Rash    Pt does not remember Pt does not remember   Sertraline Nausea And Vomiting    Severe headache Severe headache Other reaction(s): Headache Severe headache   Sulfa Antibiotics Rash    Pt does not remember   Sulfasalazine Rash    Pt does not  remember   Tape Rash    Durabond - redness   Tapentadol Other (See Comments) and Rash    _0     Outpatient Encounter Medications as of 04/11/2021  Medication Sig Note   albuterol (VENTOLIN HFA) 108 (90 Base) MCG/ACT inhaler INHALE 2 PUFFS INTO THE LUNGS EVERY 6 (SIX) HOURS AS NEEDED FOR WHEEZING OR SHORTNESS OF BREATH    aluminum-magnesium hydroxide-simethicone (MAALOX) 200-200-20 MG/5ML SUSP Take 15 mLs by mouth 4 (four) times daily -  before meals and at bedtime.  02/09/2021: Reports taking as needed   amLODipine (NORVASC) 10 MG tablet TAKE 1 TABLET BY MOUTH DAILY    aspirin 81 MG EC tablet Take by mouth.    atorvastatin (LIPITOR) 20 MG tablet Take 1 tablet (20 mg total) by mouth at bedtime.    Azelastine HCl 137 MCG/SPRAY SOLN PLACE 1 SPRAY INTO BOTH NOSTRILS TWO TIMES DAILY    benazepril (LOTENSIN) 40 MG tablet TAKE 1 TABLET BY MOUTH DAILY    betamethasone dipropionate 0.05 % cream Apply 1 application topically 2 (two) times daily. 02/09/2021: Reports uses as needed   budesonide-formoterol (SYMBICORT) 80-4.5 MCG/ACT inhaler Inhale 2 puffs into the lungs 2 (two) times daily. 02/09/2021: Reports not taking at this time   busPIRone (BUSPAR) 10 MG tablet Take 10 mg by mouth 2 (two) times daily.     calcium citrate-vitamin D (CITRACAL+D) 315-200 MG-UNIT tablet Take 1 tablet by mouth daily.    cephALEXin (KEFLEX) 250 MG  capsule Take 250 mg by mouth daily.    Cholecalciferol (VITAMIN D3) 1000 UNITS CAPS Take 1 capsule by mouth daily.    Diclofenac Sodium 3 % GEL Apply topically 2 (two) times daily. 02/09/2021: As needed   Docusate Sodium (DSS) 100 MG CAPS Take by mouth.    DULoxetine (CYMBALTA) 60 MG capsule TAKE 1 CAPSULE BY MOUTH EVERY DAY.    ELMIRON 100 MG capsule Take 100 mg by mouth 3 (three) times daily.    fluocinonide (LIDEX) 0.05 % external solution Apply topically.    furosemide (LASIX)  20 MG tablet Take 1 tablet (20 mg total) by mouth daily as needed.    gabapentin (NEURONTIN) 300 MG capsule TAKE 2 CAPSULES BY MOUTH 3 TIMES A DAY. 02/09/2021: Reports taking twice a day   HYDROcodone-acetaminophen (NORCO) 10-325 MG tablet Take 1 tablet by mouth 3 (three) times daily.    hydrOXYzine (ATARAX/VISTARIL) 25 MG tablet Take 25 mg by mouth daily.    ketoconazole (NIZORAL) 2 % shampoo Apply 1 application topically 2 (two) times a week.    magnesium oxide (MAG-OX) 400 MG tablet Take 1 tablet (400 mg total) by mouth daily.    Melatonin 10 MG CAPS Take 1 capsule by mouth.    methocarbamol (ROBAXIN) 500 MG tablet Take 500 mg by mouth 3 (three) times daily. As needed    metoprolol succinate (TOPROL-XL) 50 MG 24 hr tablet Take 1 tablet (50 mg total) by mouth daily. Take with or immediately following a meal.    montelukast (SINGULAIR) 10 MG tablet 10 mg once daily as needed    morphine (MS CONTIN) 60 MG 12 hr tablet Take 1 tablet (60 mg total) by mouth 2 (two) times daily.    mupirocin ointment (BACTROBAN) 2 % Apply 1 application topically 2 (two) times daily.    Naldemedine Tosylate 0.2 MG TABS Take 0.2 mg by mouth daily.    NYAMYC powder Apply topically.    polyethylene glycol powder (GLYCOLAX/MIRALAX) 17 GM/SCOOP powder MIX 17 GRAMS AS MARKED ON BOTTLE TOP IN 8 OUNCES OF WATER AND DRINK ONCE A DAY AS DIRECTED.    RABEprazole (ACIPHEX) 20 MG tablet TAKE 1 TABLET BY MOUTH TWICE A DAY BEFORE A MEAL.    senna (SENOKOT) 8.6 MG TABS tablet Take 1 tablet by mouth 2 (two) times daily.    Simethicone (GAS-X PO) Take 2 tablets by mouth 2 (two) times daily.    sodium chloride (OCEAN) 0.65 % nasal spray Place 1 spray into the nose as needed.    SYNTHROID 100 MCG tablet Take 1 tablet (100 mcg total) by mouth daily.    traZODone (DESYREL) 50 MG tablet Take 25-50 mg by mouth at bedtime as needed.    triamcinolone ointment (KENALOG) 0.1 % SMARTSIG:1 Application Topical 2x/day    No facility-administered  encounter medications on file as of 04/11/2021.    Patient Active Problem List   Diagnosis Date Noted   Rash 03/03/2021   Skin lesion 03/03/2021   Aortic atherosclerosis ([place]) 11/20/2020   Bradycardia 06/08/2020   Itching 04/08/2020   Dyspnea 01/17/2020   Chronic respiratory failure with hypoxia ([place]) 01/17/2020   Right hip pain 01/10/2020   Leukocytosis 11/15/2019   Wound of buttock 10/25/2019   COVID-19 virus infection 10/04/2019   Urinary frequency 09/26/2019   Cold feeling 09/12/2019   Left knee pain 07/04/2019   Hemoptysis 06/30/2019   Femur fracture ([place]) 08/06/2018   Primary osteoarthritis of right shoulder 08/15/2017   Right shoulder pain 05/16/2017  Chronic venous insufficiency 02/10/2017   Lymphedema 02/10/2017   Neuropathy 12/25/2016   Anemia due to blood loss, chronic 08/07/2016   GERD (gastroesophageal reflux disease) 12/10/2015   Finger pain 11/13/2015   Carotid artery calcification 09/26/2015   External nasal lesion 09/25/2015   Muscle cramps 09/01/2015   Excessive sweating 07/30/2015   Headache 07/16/2015   Groin pain 05/14/2015   Muscle twitching 03/05/2015   Leg cramps 02/11/2015   Back pain 02/11/2015   Abdominal pain 02/11/2015   Chronic pain 01/19/2015   Acute cystitis without hematuria 12/12/2014   Left elbow pain 11/30/2014   Health care maintenance 11/30/2014   Osteoporosis 10/19/2014   Rectal bleeding 10/19/2014   Neck pain 09/03/2014   Unsteady gait 09/03/2014   Nocturia 09/03/2014   Dysphagia 06/01/2014   Nasal dryness 06/01/2014   Stress 06/01/2014   Fatigue 02/27/2014   Degenerative disc disease 12/21/2013   Pre-op evaluation 10/12/2013   Hoarseness 08/14/2013   Leg swelling 01/24/2013   CHF (congestive heart failure) ([place]) 12/29/2012   Cough 12/29/2012   OSA (obstructive sleep apnea) 07/09/2012   Osteoarthritis 07/04/2012   Symptomatic anemia 07/04/2012   Chronic constipation 07/04/2012   Pulmonary hypertension ([place])  07/04/2012   Pulmonary nodules 07/04/2012   Hypertension 07/04/2012   Hyperlipidemia 07/04/2012   Hypothyroidism 07/04/2012    Conditions to be addressed/monitored:HTN and chronic pain  Care Plan : Hypertension (Adult)  Updates made by Leona Singleton, RN since 04/11/2021 12:00 AM     Problem: Hypertension (Hypertension)   Priority: Medium     Long-Range Goal: Hypertension Monitored   Start Date: 02/09/2021  Expected End Date: 08/31/2021  This Visit's Progress: Not on track  Recent Progress: Not on track  Priority: Medium  Note:   Objective:  Last practice recorded BP readings:  BP Readings from Last 3 Encounters:  03/28/21 (!) 134/50  03/28/21 (!) 124/46  03/03/21 130/62  Current Barriers:  Knowledge Deficits related to basic understanding of hypertension pathophysiology and self care management; patient reports she does not monitor blood pressures often at home.  Does state that when she does it is typically normal.  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  Continues to report not monitoring blood pressures, due to not feeling well the past few weeks.  Discussed importance of monitoring blood pressure.  Last blood pressure elevated to 167/79 at GI office yesterday.  States she had taking medications but had walked into office and into exam room which was long distance. Case Manager Clinical Goal(s):  patient will verbalize understanding of plan for hypertension management patient will demonstrate improved adherence to prescribed treatment plan for hypertension as evidenced by taking all medications as prescribed, monitoring and recording blood pressure as directed, adhering to low sodium/DASH diet Interventions:  Collaboration with Kimberly Pheasant, MD regarding development and update of comprehensive plan of care as evidenced by provider attestation and co-signature Inter-disciplinary care team collaboration (see longitudinal plan of  care) Evaluation of current treatment plan related to hypertension self management and patient's adherence to plan as established by provider. Provided education to patient re: stroke prevention, s/s of heart attack and stroke, DASH diet, complications of uncontrolled blood pressure Reviewed medications with patient and discussed importance of compliance Discussed plans with patient for ongoing care management follow up and provided patient with direct contact information for care management team Advised patient, providing education and rationale, to monitor blood pressure weekly and record, calling PCP for findings outside  established parameters.  Encouraged to keep and attend scheduled medical appointments Encouraged to call provider office for new concerns, questions, or BP outside discussed parameters Encouraged to follow a low sodium diet/DASH diet Encouraged patient to review list of resources provided by Care Guide for adult day care options and CCM Education officer, museum for [employer]; provided patient with number for [facility] Encouraged to check blood pressure weekly and write blood pressure results in a log to take to appointments for provider review discusssed monitoring more frequently (daily or 3 times a week) if blood pressures are more elevated notifying provider for sustained elevations Discussed importance of monitoring blood pressure while taking muscle relaxant with other pain medications Patient Goals/Self-Care Activities:: Check blood pressure weekly or more frequently if elevated Write blood pressure results in a log and take log to appointments for provider review Continue to work with CCM Social Worker for resources for your son Follow Up Plan: The care management team will reach out to the patient again over the next 45 business days.     Care Plan : Chronic Pain (Adult)  Updates made by Leona Singleton, RN since 04/11/2021 12:00 AM     Problem:  Pain Management Plan (Chronic Pain)   Priority: Medium     Long-Range Goal: Pain Management Plan Developed   Start Date: 02/09/2021  Expected End Date: 08/01/2021  This Visit's Progress: On track  Recent Progress: On track  Priority: Medium  Note:   Current Barriers:  Knowledge Deficits related to self-health management of chronic pain; patient reporting severe pain to shoulder.  States her shoulder is "bone on bone" and she receives injections every 3 months.  Reports pain is causing her to have to sleep in recliner. Feels last injection did not help shoulder; but she continues to pull through with current prescription plan.  Patient reporting left groin pain has been resolved.    Chronic Disease Management support and education needs related to chronic pain Clinical Goal(s):  patient will verbalize understanding of plan for pain management. , patient will demonstrate use of different relaxation  skills and/or diversional activities to assist with pain reduction (distraction, imagery, relaxation, massage, acupressure, TENS, heat, and cold application., patient will use pharmacological and nonpharmacological pain relief strategies as prescribed. , and patient will verbalize acceptable level of pain relief and ability to engage in desired activities Interventions:  Collaboration with Kimberly Pheasant, MD regarding development and update of comprehensive plan of care as evidenced by provider attestation and co-signature Pain assessment performed Medications reviewed and encouraged medication compliance Discussed plans with patient for ongoing care management follow up and provided patient with direct contact information for care management team Evaluation of current treatment plan related to pain and patient's adherence to plan as established by provider. Pain assessed and pain treatment goals reviewed Patient response to treatment assessed Discussed using ice and heat to help with  pain Acknowledged patient as the expert in pain self-management Discussed urinary bladder training exercises (bladder insulation) and thinks she has pulled her left groin muscle during these therapies Discussed importance of monitor self for alertness/mentation with newly prescribed muscle relaxant with other narcotic medications No significant interventions updated/changed Patient Goals/Self Care Activities:  Learn relaxation techniques and use relaxation during pain  Practice acceptance of chronic pain Practice relaxation or meditation daily Tell myself I can (not I can't) Use distraction techniques Take medication as prescribed Use Ice and Heat to help with pain Follow Up Plan: The care management team  will reach out to the patient again over the next 45 business days.       Plan:The care management team will reach out to the patient again over the next 45 business days.  Hubert Azure RN, MSN RN Care Management Coordinator [place] [phone] [email or web address]

## 2021-04-11 NOTE — Patient Instructions (Signed)
Visit Information  PATIENT GOALS:  Goals Addressed             This Visit's Progress    (RNCM) Cope with Chronic Pain   On track    Timeframe:  Long-Range Goal Priority:  Medium Start Date:   02/09/21                          Expected End Date: 08/01/21                      Follow Up Date 9/21//22   Learn relaxation techniques and use relaxation during pain  Practice acceptance of chronic pain Practice relaxation or meditation daily Tell myself I can (not I can't) Use distraction techniques Take medication as prescribed Use Ice and Heat to help with pain   Why is this important?   Stress makes chronic pain feel worse.  Feelings like depression, anxiety, stress and anger can make your body more sensitive to pain.  Learning ways to cope with stress or depression may help you find some relief from the pain.     Notes:      (RNCM) Track and Manage My Blood Pressure-Hypertension   Not on track    Timeframe:  Long-Range Goal Priority:  Medium Start Date:   02/09/21                          Expected End Date: 08/31/21                      Follow Up Date  05/23/21    Check blood pressure weekly or more frequently if elevated Write blood pressure results in a log and take log to appointments for provider review Continue to work with CCM Social Worker for resources for your son   Why is this important?   You won't feel high blood pressure, but it can still hurt your blood vessels.  High blood pressure can cause heart or kidney problems. It can also cause a stroke.  Making lifestyle changes like losing a little weight or eating less salt will help.  Checking your blood pressure at home and at different times of the day can help to control blood pressure.  If the doctor prescribes medicine remember to take it the way the doctor ordered.  Call the office if you cannot afford the medicine or if there are questions about it.     Notes:         Patient verbalizes understanding  of instructions provided today and agrees to view in [place].   The care management team will reach out to the patient again over the next 45 business days.   Hubert Azure RN, MSN RN Care Management Coordinator [place] [phone] [email or web address]

## 2021-04-24 ENCOUNTER — Telehealth: Payer: Self-pay

## 2021-04-24 NOTE — Telephone Encounter (Signed)
Pt called and wants to speak with you about an ongoing issue of yeast in her ear (for years)?Marland Kitchen. She only wants to speak with you. Please advise

## 2021-04-25 DIAGNOSIS — H02884 Meibomian gland dysfunction left upper eyelid: Secondary | ICD-10-CM | POA: Diagnosis not present

## 2021-04-26 NOTE — Telephone Encounter (Signed)
Her right ear is itching on the outside mostly on the ear lobe. 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 Advised that a visit may be needed to determine best treatment option since PCP is not in office but patient would prefer to try the medication her daughter recommended if possible

## 2021-04-26 NOTE — Telephone Encounter (Signed)
Pt notified of recommendation. She has been using Clotrimazole Cream. She states it will improve slightly & then return. I notified pt to d/c cream until we hear back from Dr. Nicki Reaper. She wanted to know what Dr. Nicki Reaper wants her to do instead.

## 2021-04-26 NOTE — Telephone Encounter (Signed)
I agree with stopping the clotrimazole cream and trying over the counter hydrocortisone cream as directed.

## 2021-04-27 ENCOUNTER — Other Ambulatory Visit: Payer: Self-pay

## 2021-04-27 ENCOUNTER — Encounter: Payer: Self-pay | Admitting: Pulmonary Disease

## 2021-04-27 ENCOUNTER — Ambulatory Visit: Payer: PPO | Admitting: Pulmonary Disease

## 2021-04-27 VITALS — BP 116/72 | HR 62 | Ht 62.0 in | Wt 176.0 lb

## 2021-04-27 DIAGNOSIS — G4734 Idiopathic sleep related nonobstructive alveolar hypoventilation: Secondary | ICD-10-CM

## 2021-04-27 NOTE — Patient Instructions (Addendum)
Will we check your oxygen levels at night while on the 2L of oxygen to ensure you are on the right level.   Continue symbicort inhaler as needed.

## 2021-04-27 NOTE — Progress Notes (Signed)
Synopsis: Referred in April 2021 for post- covid by Einar Pheasant, MD.  Subjective:   PATIENT ID: Kimberly Roy GENDER: female DOB: [birth year], MRN: [id number]  HPI  Chief Complaint  Patient presents with   Follow-up    6 mo f/u for asthma. States she is still using 2L of O2 at night but has noticed in the mornings when she wakes up her O2 is 88%. Takes it a while to recover.    Kimberly Roy is an 87 year old woman, never smoker with HFpEF, mild aortic stenosis, pulmonary hypertension, nocturnal hypoxemia and kyphosis with post-viral reactive airways disease since having covid 12/2019 who returns for follow up.   She continues to do well on symbicort 80-4.31mg 2 puffs twice daily as needed. She reports using the symbicort mainly in the morning times. She also reports she has noticed her oxygen levels seem lower when she first wakes up, around 88-90% but then increase to 93% for the remainer of the day. She is using 2L of oxygen at night. She has history of sleep apnea but did not tolerate the mask well in the past.  She reports increased fatigue recently and loss of appetite with some abdominal pain. She denies nausea, vomiting or diarrhea. She is also having a hard time ambulating at home with a walker due to her shoulder pain.  Past Medical History:  Diagnosis Date   Anemia    Anxiety    Chest pain    CHF (congestive heart failure) (HCC)    Constipation    DDD (degenerative disc disease), cervical    Depression    DVT (deep venous thrombosis) (HCC)    Dysphonia    Dyspnea    Fatty liver    Fatty liver    Headache    Hyperlipidemia    Hyperpiesia    Hypertension    Hypothyroidism    Interstitial cystitis    Left ventricular dysfunction    Lymphedema    Nephrolithiasis    Obstructive sleep apnea    Osteoarthritis    knees/cervical and lumbar spine   Pulmonary hypertension (HCC)    Pulmonary nodules    followed by Dr FRaul Del  Pure hypercholesterolemia    Renal cyst     right     Family History  Problem Relation Age of Onset   Heart disease Mother    Stroke Mother    Hypertension Mother    Heart disease Father        myocardial infarction age 85  Breast cancer Neg Hx      Social History   Socioeconomic History   Marital status: Widowed    Spouse name: Not on file   Number of children: 2   Years of education: 131  Highest education level: 12th grade  Occupational History    Comment: insurance agency  Tobacco Use   Smoking status: Never   Smokeless tobacco: Never  Vaping Use   Vaping Use: Never used  Substance and Sexual Activity   Alcohol use: No    Alcohol/week: 0.0 standard drinks   Drug use: Never   Sexual activity: Not Currently  Other Topics Concern   Not on file  Social History Narrative   Not on file   Social Determinants of Health   Financial Resource Strain: Low Risk    Difficulty of Paying Living Expenses: Not hard at all  Food Insecurity: No Food Insecurity   Worried About RCharity fundraiserin the  Last Year: Never true   Ran Out of Food in the Last Year: Never true  Transportation Needs: No Transportation Needs   Lack of Transportation (Medical): No   Lack of Transportation (Non-Medical): No  Physical Activity: Inactive   Days of Exercise per Week: 0 days   Minutes of Exercise per Session: 0 min  Stress: No Stress Concern Present   Feeling of Stress : Only a little  Social Connections: Socially Isolated   Frequency of Communication with Friends and Family: Three times a week   Frequency of Social Gatherings with Friends and Family: Twice a week   Attends Religious Services: Never   Marine scientist or Organizations: No   Attends Archivist Meetings: Never   Marital Status: Widowed  Human resources officer Violence: Not At Risk   Fear of Current or Ex-Partner: No   Emotionally Abused: No   Physically Abused: No   Sexually Abused: No     Allergies  Allergen Reactions   Lyrica [Pregabalin]  Swelling   Omnicef [Cefdinir] Diarrhea and Nausea And Vomiting   Atarax [Hydroxyzine]     jittery   Dicyclomine Other (See Comments)    Abdominal bloating    Hydroxyzine Hcl     jittery   Levaquin [Levofloxacin] Swelling   Nitrofurantoin Diarrhea   Nucynta Er [Tapentadol Hcl Er] Other (See Comments)    Severe constipation    Oxybutynin Other (See Comments)    Blurred vision   Zoloft [Sertraline Hcl]     Severe headache   Biaxin [Clarithromycin] Other (See Comments) and Rash    Pt does not remember Pt does not remember   Sertraline Nausea And Vomiting    Severe headache Severe headache Other reaction(s): Headache Severe headache   Sulfa Antibiotics Rash    Pt does not remember   Sulfasalazine Rash    Pt does not remember   Tape Rash    Durabond - redness   Tapentadol Other (See Comments) and Rash    _0      Outpatient Medications Prior to Visit  Medication Sig Dispense Refill   albuterol (VENTOLIN HFA) 108 (90 Base) MCG/ACT inhaler INHALE 2 PUFFS INTO THE LUNGS EVERY 6 (SIX) HOURS AS NEEDED FOR WHEEZING OR SHORTNESS OF BREATH 18 g 0   aluminum-magnesium hydroxide-simethicone (MAALOX) 200-200-20 MG/5ML SUSP Take 15 mLs by mouth 4 (four) times daily -  before meals and at bedtime.      amLODipine (NORVASC) 10 MG tablet TAKE 1 TABLET BY MOUTH DAILY 90 tablet 1   aspirin 81 MG EC tablet Take by mouth.     atorvastatin (LIPITOR) 20 MG tablet Take 1 tablet (20 mg total) by mouth at bedtime. 90 tablet 1   Azelastine HCl 137 MCG/SPRAY SOLN PLACE 1 SPRAY INTO BOTH NOSTRILS TWO TIMES DAILY 30 mL 1   benazepril (LOTENSIN) 40 MG tablet TAKE 1 TABLET BY MOUTH DAILY 90 tablet 1   betamethasone dipropionate 0.05 % cream Apply 1 application topically 2 (two) times daily.     budesonide-formoterol (SYMBICORT) 80-4.5 MCG/ACT inhaler Inhale 2 puffs into the lungs 2 (two) times daily. 1 each  12   busPIRone (BUSPAR) 10 MG tablet Take 10 mg by mouth 2 (two) times daily.      calcium citrate-vitamin D (CITRACAL+D) 315-200 MG-UNIT tablet Take 1 tablet by mouth daily.     cephALEXin (KEFLEX) 250 MG capsule Take 250 mg by mouth daily.  Cholecalciferol (VITAMIN D3) 1000 UNITS CAPS Take 1 capsule by mouth daily.     Diclofenac Sodium 3 % GEL Apply topically 2 (two) times daily.     Docusate Sodium (DSS) 100 MG CAPS Take by mouth.     DULoxetine (CYMBALTA) 60 MG capsule TAKE 1 CAPSULE BY MOUTH EVERY DAY. 30 capsule 3   ELMIRON 100 MG capsule Take 100 mg by mouth 3 (three) times daily.     fluocinonide (LIDEX) 0.05 % external solution Apply topically.     furosemide (LASIX) 20 MG tablet Take 1 tablet (20 mg total) by mouth daily as needed. 90 tablet 0   gabapentin (NEURONTIN) 300 MG capsule TAKE 2 CAPSULES BY MOUTH 3 TIMES A DAY. 540 capsule 2   HYDROcodone-acetaminophen (NORCO) 10-325 MG tablet Take 1 tablet by mouth 3 (three) times daily. 10 tablet 0   hydrOXYzine (ATARAX/VISTARIL) 25 MG tablet Take 25 mg by mouth daily.     ketoconazole (NIZORAL) 2 % shampoo Apply 1 application topically 2 (two) times a week.     magnesium oxide (MAG-OX) 400 MG tablet Take 1 tablet (400 mg total) by mouth daily. 30 tablet 1   Melatonin 10 MG CAPS Take 1 capsule by mouth.     methocarbamol (ROBAXIN) 500 MG tablet Take 500 mg by mouth 3 (three) times daily. As needed     metoprolol succinate (TOPROL-XL) 50 MG 24 hr tablet Take 1 tablet (50 mg total) by mouth daily. Take with or immediately following a meal. 90 tablet 1   montelukast (SINGULAIR) 10 MG tablet 10 mg once daily as needed     morphine (MS CONTIN) 60 MG 12 hr tablet Take 1 tablet (60 mg total) by mouth 2 (two) times daily. 10 tablet 0   mupirocin ointment (BACTROBAN) 2 % Apply 1 application topically 2 (two) times daily. 22 g 0   Naldemedine Tosylate 0.2 MG TABS Take 0.2 mg by mouth daily.     NYAMYC powder Apply topically.     polyethylene  glycol powder (GLYCOLAX/MIRALAX) 17 GM/SCOOP powder MIX 17 GRAMS AS MARKED ON BOTTLE TOP IN 8 OUNCES OF WATER AND DRINK ONCE A DAY AS DIRECTED. 527 g 0   RABEprazole (ACIPHEX) 20 MG tablet TAKE 1 TABLET BY MOUTH TWICE A DAY BEFORE A MEAL. 180 tablet 1   senna (SENOKOT) 8.6 MG TABS tablet Take 1 tablet by mouth 2 (two) times daily.     Simethicone (GAS-X PO) Take 2 tablets by mouth 2 (two) times daily.     sodium chloride (OCEAN) 0.65 % nasal spray Place 1 spray into the nose as needed.     SYNTHROID 100 MCG tablet Take 1 tablet (100 mcg total) by mouth daily. 90 tablet 3   traZODone (DESYREL) 50 MG tablet Take 25-50 mg by mouth at bedtime as needed.     triamcinolone ointment (KENALOG) 0.1 % SMARTSIG:1 Application Topical 2x/day 30 g 0   No facility-administered medications prior to visit.    Review of Systems  Constitutional:  Negative for chills, fever, malaise/fatigue and weight loss.  HENT:  Negative for congestion, sinus pain and sore throat.   Eyes: Negative.   Respiratory:  Positive for shortness of breath. Negative for cough, hemoptysis, sputum production and wheezing.   Cardiovascular:  Negative for chest pain, palpitations, orthopnea, claudication and leg swelling.  Gastrointestinal:  Positive for abdominal pain. Negative for heartburn, nausea and vomiting.  Genitourinary: Negative.   Musculoskeletal:  Negative for joint pain and myalgias.  Skin:  Negative for rash.  Neurological:  Negative for weakness.  Endo/Heme/Allergies: Negative.   Psychiatric/Behavioral: Negative.     Objective:   Vitals:   04/27/21 1207  BP: 116/72  Pulse: 62  SpO2: 93%  Weight: 176 lb (79.8 kg)  Height: _0  (1.575 m)     Physical Exam Vitals reviewed.  Constitutional:      General: She is not in acute distress.    Appearance: She is not ill-appearing.  HENT:     Head: Normocephalic and atraumatic.  Eyes:     General: No scleral icterus.    Conjunctiva/sclera: Conjunctivae normal.      Pupils: Pupils are equal, round, and reactive to light.  Neck:     Comments: Cervical kyphosis Cardiovascular:     Rate and Rhythm: Normal rate and regular rhythm.     Pulses: Normal pulses.     Heart sounds: Normal heart sounds. No murmur heard. Pulmonary:     Effort: Pulmonary effort is normal.     Breath sounds: Rales (left base,mild) present. No wheezing or rhonchi.  Abdominal:     General: Bowel sounds are normal.     Palpations: Abdomen is soft.  Musculoskeletal:     Right lower leg: No edema.     Left lower leg: No edema.  Lymphadenopathy:     Cervical: No cervical adenopathy.  Skin:    General: Skin is warm and dry.  Neurological:     General: No focal deficit present.     Mental Status: She is alert.  Psychiatric:        Mood and Affect: Mood normal.        Behavior: Behavior normal.        Thought Content: Thought content normal.        Judgment: Judgment normal.   CBC    Component Value Date/Time   WBC 8.4 03/28/2021 1405   RBC 3.28 (L) 03/28/2021 1405   HGB 10.7 (L) 03/28/2021 1405   HGB 10.4 (L) 10/29/2019 0000   HCT 33.7 (L) 03/28/2021 1405   HCT 30.8 (L) 10/29/2019 0000   PLT 149 (L) 03/28/2021 1405   PLT 284 10/29/2019 0000   MCV 102.7 (H) 03/28/2021 1405   MCV 95 10/29/2019 0000   MCV 94 04/18/2014 0944   MCH 32.6 03/28/2021 1405   MCHC 31.8 03/28/2021 1405   RDW 13.5 03/28/2021 1405   RDW 12.5 10/29/2019 0000   RDW 14.8 (H) 04/18/2014 0944   LYMPHSABS 2.3 03/28/2021 1405   LYMPHSABS 2.0 10/29/2019 0000   LYMPHSABS 2.4 04/18/2014 0944   MONOABS 0.6 03/28/2021 1405   MONOABS 0.6 04/18/2014 0944   EOSABS 0.1 03/28/2021 1405   EOSABS 0.3 10/29/2019 0000   EOSABS 0.4 04/18/2014 0944   BASOSABS 0.0 03/28/2021 1405   BASOSABS 0.0 10/29/2019 0000   BASOSABS 0.0 04/18/2014 0944   BMP Latest Ref Rng & Units 03/28/2021 01/24/2021 12/27/2020  Glucose 70 - 99 mg/dL 122(H) 96 177(H)  BUN 8 - 23 mg/dL 25(H) 18 25(H)  Creatinine 0.44 - 1.00 mg/dL 1.00  0.84 1.03(H)  BUN/Creat Ratio 12 - 28 - - -  Sodium 135 - 145 mmol/L 136 139 138  Potassium 3.5 - 5.1 mmol/L 4.3 4.2 3.8  Chloride 98 - 111 mmol/L 101 98 100  CO2 22 - 32 mmol/L _1 Calcium 8.9 - 10.3 mg/dL 9.2 9.9 9.6   Chest imaging: HRCT Chest 12/29/19 1. No definitive evidence of interstitial lung disease. 2. Mild congestive  heart failure. 3. Air trapping is indicative of small airways disease. 4. Scattered pulmonary nodules are similar to 07/15/2012 and considered benign. 5. Aortic atherosclerosis (ICD10-I70.0). Coronary artery Calcification.  PFT: PFT Results Latest Ref Rng & Units 01/17/2020  FVC-Pre L 1.71  FVC-Predicted Pre % 81  FVC-Post L 1.71  FVC-Predicted Post % 81  Pre FEV1/FVC % % 80  Post FEV1/FCV % % 83  FEV1-Pre L 1.36  FEV1-Predicted Pre % 88  FEV1-Post L 1.42  DLCO uncorrected ml/min/mmHg 11.24  DLCO UNC% % 65  DLCO corrected ml/min/mmHg 12.35  DLCO COR %Predicted % 71  DLVA Predicted % 100  TLC L 3.62  TLC % Predicted % 76  RV % Predicted % 81    Home Sleep Test 2014 Mild OSA, AHI 11/hr     Assessment & Plan:   Nocturnal hypoxemia - Plan: Pulse oximetry, overnight  Discussion: Ayano Douthitt is an 87 year old woman, never smoker with HFpEF, mild aortic stenosis, pulmonary hypertension, nocturnal hypoxemia and kyphosis with post-viral reactive airways disease since having covid 12/2019 who returns for follow up.   She is to continue on symbicort 80-4.51mg 2 puffs twice daily PRN along with as needed albuterol.   She is to continue nocturnal oxygen. We will repeat an overnight oximetry test while on 2L of O2 to determine if she is on the appropriate amount of oxygen at night.  Follow up in 12 months.  JFreda Jackson MD [facility] Office: [phone]  Current Outpatient Medications:    albuterol (VENTOLIN HFA) 108 (90 Base) MCG/ACT inhaler, INHALE 2 PUFFS INTO THE LUNGS EVERY 6 (SIX) HOURS AS NEEDED FOR WHEEZING  OR SHORTNESS OF BREATH, Disp: 18 g, Rfl: 0   aluminum-magnesium hydroxide-simethicone (MAALOX) 2628-366-29MG/5ML SUSP, Take 15 mLs by mouth 4 (four) times daily -  before meals and at bedtime. , Disp: , Rfl:    amLODipine (NORVASC) 10 MG tablet, TAKE 1 TABLET BY MOUTH DAILY, Disp: 90 tablet, Rfl: 1   aspirin 81 MG EC tablet, Take by mouth., Disp: , Rfl:    atorvastatin (LIPITOR) 20 MG tablet, Take 1 tablet (20 mg total) by mouth at bedtime., Disp: 90 tablet, Rfl: 1   Azelastine HCl 137 MCG/SPRAY SOLN, PLACE 1 SPRAY INTO BOTH NOSTRILS TWO TIMES DAILY, Disp: 30 mL, Rfl: 1   benazepril (LOTENSIN) 40 MG tablet, TAKE 1 TABLET BY MOUTH DAILY, Disp: 90 tablet, Rfl: 1   betamethasone dipropionate 0.05 % cream, Apply 1 application topically 2 (two) times daily., Disp: , Rfl:    budesonide-formoterol (SYMBICORT) 80-4.5 MCG/ACT inhaler, Inhale 2 puffs into the lungs 2 (two) times daily., Disp: 1 each, Rfl: 12   busPIRone (BUSPAR) 10 MG tablet, Take 10 mg by mouth 2 (two) times daily. , Disp: , Rfl:    calcium citrate-vitamin D (CITRACAL+D) 315-200 MG-UNIT tablet, Take 1 tablet by mouth daily., Disp: , Rfl:    cephALEXin (KEFLEX) 250 MG capsule, Take 250 mg by mouth daily., Disp: , Rfl:    Cholecalciferol (VITAMIN D3) 1000 UNITS CAPS, Take 1 capsule by mouth daily., Disp: , Rfl:    Diclofenac Sodium 3 % GEL, Apply topically 2 (two) times daily., Disp: , Rfl:    Docusate Sodium (DSS) 100 MG CAPS, Take by mouth., Disp: , Rfl:    DULoxetine (CYMBALTA) 60 MG capsule, TAKE 1 CAPSULE BY MOUTH EVERY DAY., Disp: 30 capsule, Rfl: 3   ELMIRON 100 MG capsule, Take 100 mg by mouth 3 (three) times  daily., Disp: , Rfl:    fluocinonide (LIDEX) 0.05 % external solution, Apply topically., Disp: , Rfl:    furosemide (LASIX) 20 MG tablet, Take 1 tablet (20 mg total) by mouth daily as needed., Disp: 90 tablet, Rfl: 0   gabapentin (NEURONTIN) 300 MG capsule, TAKE 2 CAPSULES BY MOUTH 3 TIMES A DAY., Disp: 540 capsule, Rfl: 2    HYDROcodone-acetaminophen (NORCO) 10-325 MG tablet, Take 1 tablet by mouth 3 (three) times daily., Disp: 10 tablet, Rfl: 0   hydrOXYzine (ATARAX/VISTARIL) 25 MG tablet, Take 25 mg by mouth daily., Disp: , Rfl:    ketoconazole (NIZORAL) 2 % shampoo, Apply 1 application topically 2 (two) times a week., Disp: , Rfl:    magnesium oxide (MAG-OX) 400 MG tablet, Take 1 tablet (400 mg total) by mouth daily., Disp: 30 tablet, Rfl: 1   Melatonin 10 MG CAPS, Take 1 capsule by mouth., Disp: , Rfl:    methocarbamol (ROBAXIN) 500 MG tablet, Take 500 mg by mouth 3 (three) times daily. As needed, Disp: , Rfl:    metoprolol succinate (TOPROL-XL) 50 MG 24 hr tablet, Take 1 tablet (50 mg total) by mouth daily. Take with or immediately following a meal., Disp: 90 tablet, Rfl: 1   montelukast (SINGULAIR) 10 MG tablet, 10 mg once daily as needed, Disp: , Rfl:    morphine (MS CONTIN) 60 MG 12 hr tablet, Take 1 tablet (60 mg total) by mouth 2 (two) times daily., Disp: 10 tablet, Rfl: 0   mupirocin ointment (BACTROBAN) 2 %, Apply 1 application topically 2 (two) times daily., Disp: 22 g, Rfl: 0   Naldemedine Tosylate 0.2 MG TABS, Take 0.2 mg by mouth daily., Disp: , Rfl:    NYAMYC powder, Apply topically., Disp: , Rfl:    polyethylene glycol powder (GLYCOLAX/MIRALAX) 17 GM/SCOOP powder, MIX 17 GRAMS AS MARKED ON BOTTLE TOP IN 8 OUNCES OF WATER AND DRINK ONCE A DAY AS DIRECTED., Disp: 527 g, Rfl: 0   RABEprazole (ACIPHEX) 20 MG tablet, TAKE 1 TABLET BY MOUTH TWICE A DAY BEFORE A MEAL., Disp: 180 tablet, Rfl: 1   senna (SENOKOT) 8.6 MG TABS tablet, Take 1 tablet by mouth 2 (two) times daily., Disp: , Rfl:    Simethicone (GAS-X PO), Take 2 tablets by mouth 2 (two) times daily., Disp: , Rfl:    sodium chloride (OCEAN) 0.65 % nasal spray, Place 1 spray into the nose as needed., Disp: , Rfl:    SYNTHROID 100 MCG tablet, Take 1 tablet (100 mcg total) by mouth daily., Disp: 90 tablet, Rfl: 3   traZODone (DESYREL) 50 MG tablet, Take  25-50 mg by mouth at bedtime as needed., Disp: , Rfl:    triamcinolone ointment (KENALOG) 0.1 %, SMARTSIG:1 Application Topical 2x/day, Disp: 30 g, Rfl: 0

## 2021-04-27 NOTE — Telephone Encounter (Signed)
Pt aware of below.

## 2021-04-28 ENCOUNTER — Encounter: Payer: Self-pay | Admitting: Pulmonary Disease

## 2021-05-01 ENCOUNTER — Other Ambulatory Visit: Payer: Self-pay | Admitting: Internal Medicine

## 2021-05-01 ENCOUNTER — Encounter: Payer: Self-pay | Admitting: Pulmonary Disease

## 2021-05-01 DIAGNOSIS — M19012 Primary osteoarthritis, left shoulder: Secondary | ICD-10-CM | POA: Diagnosis not present

## 2021-05-01 DIAGNOSIS — M19011 Primary osteoarthritis, right shoulder: Secondary | ICD-10-CM | POA: Diagnosis not present

## 2021-05-01 DIAGNOSIS — M47812 Spondylosis without myelopathy or radiculopathy, cervical region: Secondary | ICD-10-CM | POA: Diagnosis not present

## 2021-05-01 DIAGNOSIS — M25512 Pain in left shoulder: Secondary | ICD-10-CM | POA: Diagnosis not present

## 2021-05-01 DIAGNOSIS — M542 Cervicalgia: Secondary | ICD-10-CM | POA: Diagnosis not present

## 2021-05-01 DIAGNOSIS — M25511 Pain in right shoulder: Secondary | ICD-10-CM | POA: Diagnosis not present

## 2021-05-08 DIAGNOSIS — F411 Generalized anxiety disorder: Secondary | ICD-10-CM | POA: Diagnosis not present

## 2021-05-08 DIAGNOSIS — F331 Major depressive disorder, recurrent, moderate: Secondary | ICD-10-CM | POA: Diagnosis not present

## 2021-05-14 ENCOUNTER — Other Ambulatory Visit: Payer: Self-pay

## 2021-05-14 ENCOUNTER — Encounter: Payer: Self-pay | Admitting: Internal Medicine

## 2021-05-14 ENCOUNTER — Ambulatory Visit (INDEPENDENT_AMBULATORY_CARE_PROVIDER_SITE_OTHER): Payer: PPO | Admitting: Internal Medicine

## 2021-05-14 VITALS — BP 124/60 | HR 77 | Temp 96.6°F | Resp 16 | Ht 62.0 in | Wt 174.0 lb

## 2021-05-14 DIAGNOSIS — I10 Essential (primary) hypertension: Secondary | ICD-10-CM | POA: Diagnosis not present

## 2021-05-14 DIAGNOSIS — E033 Postinfectious hypothyroidism: Secondary | ICD-10-CM

## 2021-05-14 DIAGNOSIS — K219 Gastro-esophageal reflux disease without esophagitis: Secondary | ICD-10-CM

## 2021-05-14 DIAGNOSIS — I700 Atherosclerosis of aorta: Secondary | ICD-10-CM

## 2021-05-14 DIAGNOSIS — J9611 Chronic respiratory failure with hypoxia: Secondary | ICD-10-CM

## 2021-05-14 DIAGNOSIS — I509 Heart failure, unspecified: Secondary | ICD-10-CM

## 2021-05-14 DIAGNOSIS — K5909 Other constipation: Secondary | ICD-10-CM

## 2021-05-14 DIAGNOSIS — G8929 Other chronic pain: Secondary | ICD-10-CM

## 2021-05-14 DIAGNOSIS — E785 Hyperlipidemia, unspecified: Secondary | ICD-10-CM | POA: Diagnosis not present

## 2021-05-14 DIAGNOSIS — M5440 Lumbago with sciatica, unspecified side: Secondary | ICD-10-CM

## 2021-05-14 DIAGNOSIS — R1084 Generalized abdominal pain: Secondary | ICD-10-CM

## 2021-05-14 DIAGNOSIS — F439 Reaction to severe stress, unspecified: Secondary | ICD-10-CM

## 2021-05-14 DIAGNOSIS — Z23 Encounter for immunization: Secondary | ICD-10-CM

## 2021-05-14 DIAGNOSIS — M542 Cervicalgia: Secondary | ICD-10-CM

## 2021-05-14 DIAGNOSIS — I872 Venous insufficiency (chronic) (peripheral): Secondary | ICD-10-CM

## 2021-05-14 DIAGNOSIS — I272 Pulmonary hypertension, unspecified: Secondary | ICD-10-CM

## 2021-05-14 DIAGNOSIS — Z8616 Personal history of COVID-19: Secondary | ICD-10-CM | POA: Diagnosis not present

## 2021-05-14 LAB — LIPID PANEL
Cholesterol: 128 mg/dL (ref 0–200)
HDL: 44.3 mg/dL (ref >39.00)
LDL Cholesterol: 60 mg/dL (ref 0–99)
NonHDL: 84.12
Total CHOL/HDL Ratio: 3
Triglycerides: 123 mg/dL (ref 0.0–149.0)
VLDL: 24.6 mg/dL (ref 0.0–40.0)

## 2021-05-14 LAB — HEPATIC FUNCTION PANEL
ALT: 11 U/L (ref 0–35)
AST: 17 U/L (ref 0–37)
Albumin: 4 g/dL (ref 3.5–5.2)
Alkaline Phosphatase: 78 U/L (ref 39–117)
Bilirubin, Direct: 0.1 mg/dL (ref 0.0–0.3)
Total Bilirubin: 0.4 mg/dL (ref 0.2–1.2)
Total Protein: 6.4 g/dL (ref 6.0–8.3)

## 2021-05-14 LAB — BASIC METABOLIC PANEL
BUN: 16 mg/dL (ref 6–23)
CO2: 35 mEq/L — ABNORMAL HIGH (ref 19–32)
Calcium: 10.2 mg/dL (ref 8.4–10.5)
Chloride: 100 mEq/L (ref 96–112)
Creatinine, Ser: 0.77 mg/dL (ref 0.40–1.20)
GFR: 68.98 mL/min (ref >60.00)
Glucose, Bld: 86 mg/dL (ref 70–99)
Potassium: 4.4 mEq/L (ref 3.5–5.1)
Sodium: 143 mEq/L (ref 135–145)

## 2021-05-14 LAB — TSH: TSH: 0.45 u[IU]/mL (ref 0.35–5.50)

## 2021-05-14 MED ORDER — MUPIROCIN 2 % EX OINT: 1.0000 "application " | TOPICAL_OINTMENT | Freq: Two times a day (BID) | CUTANEOUS | 0 refills | Status: DC

## 2021-05-14 NOTE — Progress Notes (Signed)
Patient ID: Kimberly Roy, female   DOB: [birth year], 87 y.o.   MRN: [id number]   Subjective:    Patient ID: Kimberly Roy, female    DOB: 1930/12/15, 87 y.o.   MRN: [id number]  This visit occurred during the SARS-CoV-2 public health emergency.  Safety protocols were in place, including screening questions prior to the visit, additional usage of staff PPE, and extensive cleaning of exam room while observing appropriate contact time as indicated for disinfecting solutions.   Patient here for  scheduled follow up.   Chief Complaint  Patient presents with   Follow-up    2 month follow up    .   HPI Here to follow up regarding her blood pressure, cholesterol and increased stress.  Seeing ortho.  Evaluated 05/01/21 - neck and shoulder pain.  S/p injections both shoulders.  Xrays - severe destructive arthrosis to both shoulders (bone on bone).  Neck xray - see ortho note - cervical spondylosis. Not a candidate for surgery.  Ortho increased gabapentin and recommended continuing voltaren gel.  Consider nerve stimulator.  Continues with discomfort.  Also with weakness.  Has to use a walker to ambulate.  Having trouble with this.  Also seeing pulmonary.  Recommended continuing symbicort and nocturnal oxygen.  Did recommend repeat overnight oximetry.  Breathing overall stable.  No chest pain.  Eating.  Persistent abdominal discomfort.  Saw GI 04/09/21.  Recommended increasing miralax to bid.  Continue PPI.  Recommended to call with update.  Discussed.     Past Medical History:  Diagnosis Date   Anemia    Anxiety    Chest pain    CHF (congestive heart failure) (HCC)    Constipation    DDD (degenerative disc disease), cervical    Depression    DVT (deep venous thrombosis) (HCC)    Dysphonia    Dyspnea    Fatty liver    Fatty liver    Headache    Hyperlipidemia    Hyperpiesia    Hypertension    Hypothyroidism    Interstitial cystitis    Left ventricular dysfunction    Lymphedema    Nephrolithiasis     Obstructive sleep apnea    Osteoarthritis    knees/cervical and lumbar spine   Pulmonary hypertension (HCC)    Pulmonary nodules    followed by Dr Raul Del   Pure hypercholesterolemia    Renal cyst    right   Past Surgical History:  Procedure Laterality Date   ABDOMINAL HYSTERECTOMY     ovaries left in place   APPENDECTOMY     Back Surgeries     BACK SURGERY     BREAST REDUCTION SURGERY     3/99   CARDIAC CATHETERIZATION     cataracts Bilateral    CERVICAL SPINE SURGERY     ESOPHAGEAL MANOMETRY N/A 08/02/2015   Procedure: ESOPHAGEAL MANOMETRY (EM);  Surgeon: Josefine Class, MD;  Location: [facility] ENDOSCOPY;  Service: Endoscopy;  Laterality: N/A;   ESOPHAGOGASTRODUODENOSCOPY N/A 02/27/2015   Procedure: ESOPHAGOGASTRODUODENOSCOPY (EGD);  Surgeon: Hulen Luster, MD;  Location: [facility] ENDOSCOPY;  Service: Gastroenterology;  Laterality: N/A;   ESOPHAGOGASTRODUODENOSCOPY (EGD) WITH PROPOFOL N/A 10/30/2018   Procedure: ESOPHAGOGASTRODUODENOSCOPY (EGD) WITH PROPOFOL;  Surgeon: Lollie Sails, MD;  Location: [facility] ENDOSCOPY;  Service: Endoscopy;  Laterality: N/A;   EXCISIONAL [place] SURGERY  2013  Right hip surgery   JOINT REPLACEMENT     KNEE ARTHROSCOPY     left and right   ORIF FEMUR FRACTURE Right 08/07/2018   Procedure: OPEN REDUCTION INTERNAL FIXATION (ORIF) DISTAL FEMUR FRACTURE;  Surgeon: Hessie Knows, MD;  Location: [facility] ORS;  Service: Orthopedics;  Laterality: Right;   REDUCTION MAMMAPLASTY Bilateral YRS AGO   REPLACEMENT TOTAL KNEE Bilateral    rotator cuff surgery     blilateral   TONSILECTOMY/ADENOIDECTOMY WITH MYRINGOTOMY     VISCERAL ARTERY INTERVENTION N/A 02/25/2019   Procedure: VISCERAL ARTERY INTERVENTION;  Surgeon: Algernon Huxley, MD;  Location: [facility] LAB;  Service: Cardiovascular;  Laterality: N/A;   Family History  Problem Relation Age of Onset   Heart disease Mother     Stroke Mother    Hypertension Mother    Heart disease Father        myocardial infarction age 97   Breast cancer Neg Hx    Social History   Socioeconomic History   Marital status: Widowed    Spouse name: Not on file   Number of children: 2   Years of education: 55   Highest education level: 12th grade  Occupational History    Comment: insurance agency  Tobacco Use   Smoking status: Never   Smokeless tobacco: Never  Vaping Use   Vaping Use: Never used  Substance and Sexual Activity   Alcohol use: No    Alcohol/week: 0.0 standard drinks   Drug use: Never   Sexual activity: Not Currently  Other Topics Concern   Not on file  Social History Narrative   Not on file   Social Determinants of Health   Financial Resource Strain: Low Risk    Difficulty of Paying Living Expenses: Not hard at all  Food Insecurity: No Food Insecurity   Worried About Charity fundraiser in the Last Year: Never true   [place] in the Last Year: Never true  Transportation Needs: No Transportation Needs   Lack of Transportation (Medical): No   Lack of Transportation (Non-Medical): No  Physical Activity: Inactive   Days of Exercise per Week: 0 days   Minutes of Exercise per Session: 0 min  Stress: No Stress Concern Present   Feeling of Stress : Only a little  Social Connections: Socially Isolated   Frequency of Communication with Friends and Family: Three times a week   Frequency of Social Gatherings with Friends and Family: Twice a week   Attends Religious Services: Never   Marine scientist or Organizations: No   Attends Archivist Meetings: Never   Marital Status: Widowed    Review of Systems  Constitutional:  Positive for fatigue. Negative for appetite change.  HENT:  Negative for congestion and sinus pressure.   Respiratory:  Negative for cough and chest tightness.        Breathing stable.    Cardiovascular:  Negative for chest pain and palpitations.       Wearing  compression hose.  Swelling improved.   Gastrointestinal:  Positive for abdominal pain and constipation. Negative for nausea and vomiting.  Genitourinary:  Negative for difficulty urinating and dysuria.  Musculoskeletal:  Negative for joint swelling and myalgias.  Skin:  Negative for color change and rash.  Neurological:  Negative for dizziness, light-headedness and headaches.  Psychiatric/Behavioral:  Negative for agitation and dysphoric mood.       Objective:     BP 124/60   Pulse 77  Temp (!) 96.6 F (35.9 C) (Temporal)   Resp 16   Ht _0  (1.575 m)   Wt 174 lb (78.9 kg)   SpO2 93%   BMI 31.83 kg/m  Wt Readings from Last 3 Encounters:  05/14/21 174 lb (78.9 kg)  04/27/21 176 lb (79.8 kg)  03/28/21 175 lb (79.4 kg)    Physical Exam Vitals reviewed.  Constitutional:      General: She is not in acute distress.    Appearance: Normal appearance.  HENT:     Head: Normocephalic and atraumatic.     Right Ear: External ear normal.     Left Ear: External ear normal.  Eyes:     General: No scleral icterus.       Right eye: No discharge.        Left eye: No discharge.     Conjunctiva/sclera: Conjunctivae normal.  Neck:     Thyroid: No thyromegaly.  Cardiovascular:     Rate and Rhythm: Normal rate and regular rhythm.  Pulmonary:     Effort: No respiratory distress.     Breath sounds: Normal breath sounds. No wheezing.  Abdominal:     General: Bowel sounds are normal.     Palpations: Abdomen is soft.     Tenderness: There is no abdominal tenderness.  Musculoskeletal:        General: No swelling or tenderness.     Cervical back: Neck supple. No tenderness.  Lymphadenopathy:     Cervical: No cervical adenopathy.  Skin:    Findings: No erythema or rash.  Neurological:     Mental Status: She is alert.  Psychiatric:        Mood and Affect: Mood normal.        Behavior: Behavior normal.     Outpatient Encounter Medications as of 05/14/2021  Medication Sig    albuterol (VENTOLIN HFA) 108 (90 Base) MCG/ACT inhaler INHALE 2 PUFFS INTO THE LUNGS EVERY 6 (SIX) HOURS AS NEEDED FOR WHEEZING OR SHORTNESS OF BREATH   aluminum-magnesium hydroxide-simethicone (MAALOX) 200-200-20 MG/5ML SUSP Take 15 mLs by mouth 4 (four) times daily -  before meals and at bedtime.    amLODipine (NORVASC) 10 MG tablet TAKE 1 TABLET BY MOUTH DAILY   aspirin 81 MG EC tablet Take by mouth.   atorvastatin (LIPITOR) 20 MG tablet Take 1 tablet (20 mg total) by mouth at bedtime.   Azelastine HCl 137 MCG/SPRAY SOLN PLACE 1 SPRAY INTO BOTH NOSTRILS TWO TIMES DAILY   benazepril (LOTENSIN) 40 MG tablet TAKE 1 TABLET BY MOUTH DAILY   betamethasone dipropionate 0.05 % cream Apply 1 application topically 2 (two) times daily.   budesonide-formoterol (SYMBICORT) 80-4.5 MCG/ACT inhaler Inhale 2 puffs into the lungs 2 (two) times daily.   busPIRone (BUSPAR) 10 MG tablet Take 10 mg by mouth 2 (two) times daily.    calcium citrate-vitamin D (CITRACAL+D) 315-200 MG-UNIT tablet Take 1 tablet by mouth daily.   cephALEXin (KEFLEX) 250 MG capsule Take 250 mg by mouth daily.   Cholecalciferol (VITAMIN D3) 1000 UNITS CAPS Take 1 capsule by mouth daily.   Diclofenac Sodium 3 % GEL Apply topically 2 (two) times daily.   Docusate Sodium (DSS) 100 MG CAPS Take by mouth.   DULoxetine (CYMBALTA) 60 MG capsule TAKE 1 CAPSULE BY MOUTH EVERY DAY.   ELMIRON 100 MG capsule Take 100 mg by mouth 3 (three) times daily.   fluocinonide (LIDEX) 0.05 % external solution Apply topically.   furosemide (LASIX) 20 MG  tablet Take 1 tablet (20 mg total) by mouth daily as needed.   gabapentin (NEURONTIN) 300 MG capsule TAKE 2 CAPSULES BY MOUTH 3 TIMES A DAY.   HYDROcodone-acetaminophen (NORCO) 10-325 MG tablet Take 1 tablet by mouth 3 (three) times daily.   hydrOXYzine (ATARAX/VISTARIL) 25 MG tablet Take 25 mg by mouth daily.   ketoconazole (NIZORAL) 2 % shampoo Apply 1 application topically 2 (two) times a week.   magnesium  oxide (MAG-OX) 400 MG tablet Take 1 tablet (400 mg total) by mouth daily.   Melatonin 10 MG CAPS Take 1 capsule by mouth.   methocarbamol (ROBAXIN) 500 MG tablet Take 500 mg by mouth 3 (three) times daily. As needed   metoprolol succinate (TOPROL-XL) 50 MG 24 hr tablet Take 1 tablet (50 mg total) by mouth daily. Take with or immediately following a meal.   montelukast (SINGULAIR) 10 MG tablet 10 mg once daily as needed   morphine (MS CONTIN) 60 MG 12 hr tablet Take 1 tablet (60 mg total) by mouth 2 (two) times daily.   Naldemedine Tosylate 0.2 MG TABS Take 0.2 mg by mouth daily.   NYAMYC powder Apply topically.   polyethylene glycol powder (GLYCOLAX/MIRALAX) 17 GM/SCOOP powder MIX 17 GRAMS AS MARKED ON BOTTLE TOP IN 8 OUNCES OF WATER AND DRINK ONCE A DAY AS DIRECTED.   RABEprazole (ACIPHEX) 20 MG tablet TAKE 1 TABLET BY MOUTH TWICE A DAY BEFORE A MEAL.   senna (SENOKOT) 8.6 MG TABS tablet Take 1 tablet by mouth 2 (two) times daily.   Simethicone (GAS-X PO) Take 2 tablets by mouth 2 (two) times daily.   sodium chloride (OCEAN) 0.65 % nasal spray Place 1 spray into the nose as needed.   SYNTHROID 100 MCG tablet Take 1 tablet (100 mcg total) by mouth daily.   traZODone (DESYREL) 50 MG tablet Take 25-50 mg by mouth at bedtime as needed.   triamcinolone ointment (KENALOG) 0.1 % SMARTSIG:1 Application Topical 2x/day   [DISCONTINUED] mupirocin ointment (BACTROBAN) 2 % Apply 1 application topically 2 (two) times daily.   mupirocin ointment (BACTROBAN) 2 % Apply 1 application topically 2 (two) times daily.   No facility-administered encounter medications on file as of 05/14/2021.     Lab Results  Component Value Date   WBC 8.4 03/28/2021   HGB 10.7 (L) 03/28/2021   HCT 33.7 (L) 03/28/2021   PLT 149 (L) 03/28/2021   GLUCOSE 86 05/14/2021   CHOL 128 05/14/2021   TRIG 123.0 05/14/2021   HDL 44.30 05/14/2021   LDLCALC 60 05/14/2021   ALT 11 05/14/2021   AST 17 05/14/2021   NA 143 05/14/2021   K  4.4 05/14/2021   CL 100 05/14/2021   CREATININE 0.77 05/14/2021   BUN 16 05/14/2021   CO2 35 (H) 05/14/2021   TSH 0.45 05/14/2021   INR 0.89 08/06/2018   HGBA1C 6.0 10/19/2014    MM 3D SCREEN BREAST BILATERAL  Result Date: 03/12/2021 CLINICAL DATA:  Screening. EXAM: DIGITAL SCREENING BILATERAL MAMMOGRAM WITH TOMOSYNTHESIS AND CAD TECHNIQUE: Bilateral screening digital craniocaudal and mediolateral oblique mammograms were obtained. Bilateral screening digital breast tomosynthesis was performed. The images were evaluated with computer-aided detection. COMPARISON:  Previous exam(s). ACR Breast Density Category b: There are scattered areas of fibroglandular density. FINDINGS: There are no findings suspicious for malignancy. IMPRESSION: No mammographic evidence of malignancy. A result letter of this screening mammogram will be mailed directly to the patient. RECOMMENDATION: Screening mammogram in one year. (Code:SM-B-01Y) BI-RADS CATEGORY  1: Negative.  Electronically Signed   By: Margarette Canada M.D.   On: 03/12/2021 12:58      Assessment & Plan:   Problem List Items Addressed This Visit     Abdominal pain    Persistent abdominal discomfort as outlined.  Seeing GI.  Instructed to take miralax bid - to keep bowels moving.  Previous CT unrevealing.  Keep f/u with GI.       Aortic atherosclerosis (HCC)    Continue lipitor.       Back pain    Chronic issue.  Limits activity.  Seeing ortho.  Discussed home health PT.       CHF (congestive heart failure) (HCC)    On lasix.  Lower extremity swelling improved.  No evidence of volume overload.  Follow.       Chronic constipation    Seeing GI. miralax bid.  Follow.       Chronic respiratory failure with hypoxia (HCC)    Uses oxygen at night.  Breathing at baseline.       Chronic venous insufficiency    Continue compression hose.       GERD (gastroesophageal reflux disease)    Continue aciphex.  Followed by GI.       Hyperlipidemia     On lipitor.  Low cholesterol diet and exercise.  Follow lipid panel and liver function tests.        Relevant Orders   Lipid panel (Completed)   TSH (Completed)   Hepatic function panel (Completed)   Hypertension    Continue amlodipine, lasix and metoprolol. Blood pressure on recheck improved.  Follow pressures.  Follow metabolic panel. Recheck blood pressure improved.       Relevant Orders   Basic metabolic panel (Completed)   Hypothyroidism    On thyroid replacement.  Follow tsh.       Neck pain    Neck and shoulder pain as outlined.  Saw ortho.  Some limitations - using her walker, etc.  Discussed PT.  She will notify me of which home health agency she prefers.        Pulmonary hypertension ([place])    Followed by pulmonary.  Continue nighttime oxygen.       Stress    Followed by psychiatry.  Appears to be stable.  Follow.       Other Visit Diagnoses     History of COVID-19    -  Primary   Relevant Orders   SARS-CoV-2 Semi-Quantitative Total Antibody, Spike (Completed)   Need for immunization against influenza       Relevant Orders   Flu Vaccine QUAD High Dose(Fluad) (Completed)        Einar Pheasant, MD

## 2021-05-15 ENCOUNTER — Other Ambulatory Visit: Payer: PPO

## 2021-05-16 LAB — SARS-COV-2 SEMI-QUANTITATIVE TOTAL ANTIBODY, SPIKE: SARS COV2 AB, Total Spike Semi QN: >2500 U/mL — ABNORMAL HIGH (ref <0.8)

## 2021-05-18 ENCOUNTER — Telehealth: Payer: Self-pay | Admitting: Internal Medicine

## 2021-05-18 NOTE — Telephone Encounter (Signed)
Patient would like the office to call her. She has questions about her covid labs

## 2021-05-18 NOTE — Telephone Encounter (Signed)
Patient was double checking her results.

## 2021-05-19 ENCOUNTER — Encounter: Payer: Self-pay | Admitting: Internal Medicine

## 2021-05-19 ENCOUNTER — Other Ambulatory Visit: Payer: Self-pay | Admitting: Internal Medicine

## 2021-05-19 DIAGNOSIS — G8929 Other chronic pain: Secondary | ICD-10-CM

## 2021-05-19 DIAGNOSIS — M25511 Pain in right shoulder: Secondary | ICD-10-CM

## 2021-05-19 DIAGNOSIS — R5383 Other fatigue: Secondary | ICD-10-CM

## 2021-05-19 DIAGNOSIS — M5440 Lumbago with sciatica, unspecified side: Secondary | ICD-10-CM

## 2021-05-21 ENCOUNTER — Other Ambulatory Visit: Payer: Self-pay | Admitting: Gastroenterology

## 2021-05-21 ENCOUNTER — Encounter: Payer: Self-pay | Admitting: Internal Medicine

## 2021-05-21 DIAGNOSIS — K5909 Other constipation: Secondary | ICD-10-CM

## 2021-05-21 DIAGNOSIS — R1084 Generalized abdominal pain: Secondary | ICD-10-CM

## 2021-05-21 NOTE — Assessment & Plan Note (Signed)
Seeing GI. miralax bid.  Follow.

## 2021-05-21 NOTE — Assessment & Plan Note (Signed)
On lipitor.  Low cholesterol diet and exercise.  Follow lipid panel and liver function tests.

## 2021-05-21 NOTE — Assessment & Plan Note (Signed)
Followed by psychiatry.  Appears to be stable.  Follow.  °

## 2021-05-21 NOTE — Assessment & Plan Note (Signed)
On lasix.  Lower extremity swelling improved.  No evidence of volume overload.  Follow.

## 2021-05-21 NOTE — Assessment & Plan Note (Signed)
On thyroid replacement.  Follow tsh.  

## 2021-05-21 NOTE — Assessment & Plan Note (Signed)
Continue amlodipine, lasix and metoprolol. Blood pressure on recheck improved.  Follow pressures.  Follow metabolic panel. Recheck blood pressure improved.

## 2021-05-21 NOTE — Assessment & Plan Note (Signed)
Followed by pulmonary.  Continue nighttime oxygen.

## 2021-05-21 NOTE — Assessment & Plan Note (Signed)
Continue lipitor.

## 2021-05-21 NOTE — Assessment & Plan Note (Signed)
Uses oxygen at night.  Breathing at baseline.  

## 2021-05-21 NOTE — Assessment & Plan Note (Signed)
Continue aciphex.  Followed by GI.

## 2021-05-21 NOTE — Assessment & Plan Note (Signed)
Persistent abdominal discomfort as outlined.  Seeing GI.  Instructed to take miralax bid - to keep bowels moving.  Previous CT unrevealing.  Keep f/u with GI.

## 2021-05-21 NOTE — Assessment & Plan Note (Signed)
Neck and shoulder pain as outlined.  Saw ortho.  Some limitations - using her walker, etc.  Discussed PT.  She will notify me of which home health agency she prefers.

## 2021-05-21 NOTE — Assessment & Plan Note (Signed)
Continue compression hose.

## 2021-05-21 NOTE — Assessment & Plan Note (Signed)
Chronic issue.  Limits activity.  Seeing ortho.  Discussed home health PT.

## 2021-05-23 ENCOUNTER — Ambulatory Visit: Payer: PPO | Admitting: *Deleted

## 2021-05-23 DIAGNOSIS — M19011 Primary osteoarthritis, right shoulder: Secondary | ICD-10-CM

## 2021-05-23 DIAGNOSIS — I10 Essential (primary) hypertension: Secondary | ICD-10-CM

## 2021-05-23 NOTE — Chronic Care Management (AMB) (Signed)
  Care Management   Follow Up Note   05/23/2021 Name: Kimberly Roy MRN: [id number] DOB: 1930-09-12   Referred by: Einar Pheasant, MD Reason for referral : Chronic Care Management (HTN, Pain)   Successful telephone outreach to patient for follow up telephone assessment.  Patient answered and stated she not home and unable to talk.  Requested call back another day and time.  Follow Up Plan: The care management team will reach out to the patient again over the next 30 businesss days per patient request.   Hubert Azure RN, MSN RN Care Management Coordinator [place] [phone] [email or web address]

## 2021-05-23 NOTE — Telephone Encounter (Signed)
Order placed for Home Health PT referral.

## 2021-05-24 ENCOUNTER — Telehealth: Payer: Self-pay | Admitting: Pulmonary Disease

## 2021-05-24 DIAGNOSIS — M810 Age-related osteoporosis without current pathological fracture: Secondary | ICD-10-CM | POA: Diagnosis not present

## 2021-05-24 DIAGNOSIS — K5909 Other constipation: Secondary | ICD-10-CM | POA: Diagnosis not present

## 2021-05-24 DIAGNOSIS — N200 Calculus of kidney: Secondary | ICD-10-CM | POA: Diagnosis not present

## 2021-05-24 DIAGNOSIS — M47812 Spondylosis without myelopathy or radiculopathy, cervical region: Secondary | ICD-10-CM | POA: Diagnosis not present

## 2021-05-24 DIAGNOSIS — E039 Hypothyroidism, unspecified: Secondary | ICD-10-CM | POA: Diagnosis not present

## 2021-05-24 DIAGNOSIS — I272 Pulmonary hypertension, unspecified: Secondary | ICD-10-CM | POA: Diagnosis not present

## 2021-05-24 DIAGNOSIS — I890 Lymphedema, not elsewhere classified: Secondary | ICD-10-CM | POA: Diagnosis not present

## 2021-05-24 DIAGNOSIS — G4733 Obstructive sleep apnea (adult) (pediatric): Secondary | ICD-10-CM | POA: Diagnosis not present

## 2021-05-24 DIAGNOSIS — I110 Hypertensive heart disease with heart failure: Secondary | ICD-10-CM | POA: Diagnosis not present

## 2021-05-24 DIAGNOSIS — M47816 Spondylosis without myelopathy or radiculopathy, lumbar region: Secondary | ICD-10-CM | POA: Diagnosis not present

## 2021-05-24 DIAGNOSIS — N281 Cyst of kidney, acquired: Secondary | ICD-10-CM | POA: Diagnosis not present

## 2021-05-24 DIAGNOSIS — E7800 Pure hypercholesterolemia, unspecified: Secondary | ICD-10-CM | POA: Diagnosis not present

## 2021-05-24 DIAGNOSIS — M5441 Lumbago with sciatica, right side: Secondary | ICD-10-CM | POA: Diagnosis not present

## 2021-05-24 DIAGNOSIS — I5020 Unspecified systolic (congestive) heart failure: Secondary | ICD-10-CM | POA: Diagnosis not present

## 2021-05-24 DIAGNOSIS — K219 Gastro-esophageal reflux disease without esophagitis: Secondary | ICD-10-CM | POA: Diagnosis not present

## 2021-05-24 DIAGNOSIS — D649 Anemia, unspecified: Secondary | ICD-10-CM | POA: Diagnosis not present

## 2021-05-24 DIAGNOSIS — F32A Depression, unspecified: Secondary | ICD-10-CM | POA: Diagnosis not present

## 2021-05-24 DIAGNOSIS — R911 Solitary pulmonary nodule: Secondary | ICD-10-CM | POA: Diagnosis not present

## 2021-05-24 DIAGNOSIS — I872 Venous insufficiency (chronic) (peripheral): Secondary | ICD-10-CM | POA: Diagnosis not present

## 2021-05-24 DIAGNOSIS — J9611 Chronic respiratory failure with hypoxia: Secondary | ICD-10-CM | POA: Diagnosis not present

## 2021-05-24 DIAGNOSIS — M5030 Other cervical disc degeneration, unspecified cervical region: Secondary | ICD-10-CM | POA: Diagnosis not present

## 2021-05-24 DIAGNOSIS — F419 Anxiety disorder, unspecified: Secondary | ICD-10-CM | POA: Diagnosis not present

## 2021-05-24 DIAGNOSIS — R490 Dysphonia: Secondary | ICD-10-CM | POA: Diagnosis not present

## 2021-05-24 DIAGNOSIS — I700 Atherosclerosis of aorta: Secondary | ICD-10-CM | POA: Diagnosis not present

## 2021-05-24 DIAGNOSIS — K760 Fatty (change of) liver, not elsewhere classified: Secondary | ICD-10-CM | POA: Diagnosis not present

## 2021-05-24 NOTE — Telephone Encounter (Signed)
Patient's ONO was done on room air and not 2L as requested, but she is likely on the correct amount of oxygen at 2L which she has been on in the past. She can continue her current level of night time O2.  Thanks, Wille Glaser

## 2021-05-25 NOTE — Telephone Encounter (Signed)
Pt calling back wants results [phone]

## 2021-05-25 NOTE — Telephone Encounter (Signed)
Spoke with Kimberly Roy and reviewed ONO as dictated by Dr. Erin Fulling. Kimberly Roy stated understanding. Nothing further needed at this time.

## 2021-05-28 ENCOUNTER — Telehealth: Payer: Self-pay | Admitting: Internal Medicine

## 2021-05-28 DIAGNOSIS — M47816 Spondylosis without myelopathy or radiculopathy, lumbar region: Secondary | ICD-10-CM | POA: Diagnosis not present

## 2021-05-28 DIAGNOSIS — K5909 Other constipation: Secondary | ICD-10-CM | POA: Diagnosis not present

## 2021-05-28 DIAGNOSIS — M5441 Lumbago with sciatica, right side: Secondary | ICD-10-CM | POA: Diagnosis not present

## 2021-05-28 DIAGNOSIS — K760 Fatty (change of) liver, not elsewhere classified: Secondary | ICD-10-CM | POA: Diagnosis not present

## 2021-05-28 DIAGNOSIS — M5030 Other cervical disc degeneration, unspecified cervical region: Secondary | ICD-10-CM | POA: Diagnosis not present

## 2021-05-28 DIAGNOSIS — D649 Anemia, unspecified: Secondary | ICD-10-CM | POA: Diagnosis not present

## 2021-05-28 DIAGNOSIS — N200 Calculus of kidney: Secondary | ICD-10-CM | POA: Diagnosis not present

## 2021-05-28 DIAGNOSIS — I272 Pulmonary hypertension, unspecified: Secondary | ICD-10-CM | POA: Diagnosis not present

## 2021-05-28 DIAGNOSIS — I890 Lymphedema, not elsewhere classified: Secondary | ICD-10-CM | POA: Diagnosis not present

## 2021-05-28 DIAGNOSIS — E039 Hypothyroidism, unspecified: Secondary | ICD-10-CM | POA: Diagnosis not present

## 2021-05-28 DIAGNOSIS — N281 Cyst of kidney, acquired: Secondary | ICD-10-CM | POA: Diagnosis not present

## 2021-05-28 DIAGNOSIS — R490 Dysphonia: Secondary | ICD-10-CM | POA: Diagnosis not present

## 2021-05-28 DIAGNOSIS — I110 Hypertensive heart disease with heart failure: Secondary | ICD-10-CM | POA: Diagnosis not present

## 2021-05-28 DIAGNOSIS — R911 Solitary pulmonary nodule: Secondary | ICD-10-CM | POA: Diagnosis not present

## 2021-05-28 DIAGNOSIS — F419 Anxiety disorder, unspecified: Secondary | ICD-10-CM | POA: Diagnosis not present

## 2021-05-28 DIAGNOSIS — F32A Depression, unspecified: Secondary | ICD-10-CM | POA: Diagnosis not present

## 2021-05-28 DIAGNOSIS — I5020 Unspecified systolic (congestive) heart failure: Secondary | ICD-10-CM | POA: Diagnosis not present

## 2021-05-28 DIAGNOSIS — I872 Venous insufficiency (chronic) (peripheral): Secondary | ICD-10-CM | POA: Diagnosis not present

## 2021-05-28 DIAGNOSIS — I700 Atherosclerosis of aorta: Secondary | ICD-10-CM | POA: Diagnosis not present

## 2021-05-28 DIAGNOSIS — M47812 Spondylosis without myelopathy or radiculopathy, cervical region: Secondary | ICD-10-CM | POA: Diagnosis not present

## 2021-05-28 DIAGNOSIS — M810 Age-related osteoporosis without current pathological fracture: Secondary | ICD-10-CM | POA: Diagnosis not present

## 2021-05-28 DIAGNOSIS — K219 Gastro-esophageal reflux disease without esophagitis: Secondary | ICD-10-CM | POA: Diagnosis not present

## 2021-05-28 DIAGNOSIS — E7800 Pure hypercholesterolemia, unspecified: Secondary | ICD-10-CM | POA: Diagnosis not present

## 2021-05-28 DIAGNOSIS — J9611 Chronic respiratory failure with hypoxia: Secondary | ICD-10-CM | POA: Diagnosis not present

## 2021-05-28 DIAGNOSIS — G4733 Obstructive sleep apnea (adult) (pediatric): Secondary | ICD-10-CM | POA: Diagnosis not present

## 2021-05-28 NOTE — Telephone Encounter (Signed)
Patient states that her cousin lives with her and has had Covid. Today is the 14 th day of her cousin's quarantine. Wanting to know when they can be around her again?   Please advise

## 2021-05-29 NOTE — Telephone Encounter (Signed)
Advised patient that per CDC guidelines- quarantine is 5 days. Her cousin has returned to work and is not having any symptoms. Advised patient that she should be ok to be around them as long as she is not having any symptoms or not feeling well. Patient gave verbal understanding.

## 2021-05-29 NOTE — Telephone Encounter (Signed)
Patient calling back in. States her cousin is a Patient of Dr Derrel Nip. The cousin was informed by Dr Derrel Nip that she could return to work after 14 days.   Patient would still like a call back to know if Dr Nicki Reaper thinks it is okay for them to be around each other. Please advise

## 2021-06-01 ENCOUNTER — Other Ambulatory Visit: Payer: Self-pay | Admitting: Internal Medicine

## 2021-06-05 ENCOUNTER — Telehealth: Payer: Self-pay | Admitting: Internal Medicine

## 2021-06-05 ENCOUNTER — Encounter: Payer: Self-pay | Admitting: Internal Medicine

## 2021-06-05 DIAGNOSIS — G4733 Obstructive sleep apnea (adult) (pediatric): Secondary | ICD-10-CM | POA: Diagnosis not present

## 2021-06-05 DIAGNOSIS — I272 Pulmonary hypertension, unspecified: Secondary | ICD-10-CM | POA: Diagnosis not present

## 2021-06-05 DIAGNOSIS — I5020 Unspecified systolic (congestive) heart failure: Secondary | ICD-10-CM | POA: Diagnosis not present

## 2021-06-05 DIAGNOSIS — E039 Hypothyroidism, unspecified: Secondary | ICD-10-CM | POA: Diagnosis not present

## 2021-06-05 DIAGNOSIS — M5030 Other cervical disc degeneration, unspecified cervical region: Secondary | ICD-10-CM | POA: Diagnosis not present

## 2021-06-05 DIAGNOSIS — D649 Anemia, unspecified: Secondary | ICD-10-CM | POA: Diagnosis not present

## 2021-06-05 DIAGNOSIS — N281 Cyst of kidney, acquired: Secondary | ICD-10-CM | POA: Diagnosis not present

## 2021-06-05 DIAGNOSIS — I890 Lymphedema, not elsewhere classified: Secondary | ICD-10-CM | POA: Diagnosis not present

## 2021-06-05 DIAGNOSIS — I700 Atherosclerosis of aorta: Secondary | ICD-10-CM | POA: Diagnosis not present

## 2021-06-05 DIAGNOSIS — I872 Venous insufficiency (chronic) (peripheral): Secondary | ICD-10-CM | POA: Diagnosis not present

## 2021-06-05 DIAGNOSIS — K760 Fatty (change of) liver, not elsewhere classified: Secondary | ICD-10-CM | POA: Diagnosis not present

## 2021-06-05 DIAGNOSIS — R911 Solitary pulmonary nodule: Secondary | ICD-10-CM | POA: Diagnosis not present

## 2021-06-05 DIAGNOSIS — F32A Depression, unspecified: Secondary | ICD-10-CM | POA: Diagnosis not present

## 2021-06-05 DIAGNOSIS — M47812 Spondylosis without myelopathy or radiculopathy, cervical region: Secondary | ICD-10-CM | POA: Diagnosis not present

## 2021-06-05 DIAGNOSIS — M810 Age-related osteoporosis without current pathological fracture: Secondary | ICD-10-CM | POA: Diagnosis not present

## 2021-06-05 DIAGNOSIS — J9611 Chronic respiratory failure with hypoxia: Secondary | ICD-10-CM | POA: Diagnosis not present

## 2021-06-05 DIAGNOSIS — K219 Gastro-esophageal reflux disease without esophagitis: Secondary | ICD-10-CM | POA: Diagnosis not present

## 2021-06-05 DIAGNOSIS — N200 Calculus of kidney: Secondary | ICD-10-CM | POA: Diagnosis not present

## 2021-06-05 DIAGNOSIS — F419 Anxiety disorder, unspecified: Secondary | ICD-10-CM | POA: Diagnosis not present

## 2021-06-05 DIAGNOSIS — E7800 Pure hypercholesterolemia, unspecified: Secondary | ICD-10-CM | POA: Diagnosis not present

## 2021-06-05 DIAGNOSIS — I110 Hypertensive heart disease with heart failure: Secondary | ICD-10-CM | POA: Diagnosis not present

## 2021-06-05 DIAGNOSIS — M47816 Spondylosis without myelopathy or radiculopathy, lumbar region: Secondary | ICD-10-CM | POA: Diagnosis not present

## 2021-06-05 DIAGNOSIS — R490 Dysphonia: Secondary | ICD-10-CM | POA: Diagnosis not present

## 2021-06-05 DIAGNOSIS — K5909 Other constipation: Secondary | ICD-10-CM | POA: Diagnosis not present

## 2021-06-05 DIAGNOSIS — M5441 Lumbago with sciatica, right side: Secondary | ICD-10-CM | POA: Diagnosis not present

## 2021-06-05 NOTE — Telephone Encounter (Signed)
Patient caregiver calling in wanting to schedule Patient for a pneumonia shot. Did not see a note for this in Patient's chart.   Please advise

## 2021-06-06 DIAGNOSIS — F330 Major depressive disorder, recurrent, mild: Secondary | ICD-10-CM | POA: Diagnosis not present

## 2021-06-06 DIAGNOSIS — F5105 Insomnia due to other mental disorder: Secondary | ICD-10-CM | POA: Diagnosis not present

## 2021-06-06 DIAGNOSIS — F419 Anxiety disorder, unspecified: Secondary | ICD-10-CM | POA: Diagnosis not present

## 2021-06-06 NOTE — Telephone Encounter (Signed)
See phone note. Shingrix shots added to immunization list

## 2021-06-06 NOTE — Telephone Encounter (Signed)
She has had pneumovax after 17 and prevnar, so technically she has completed pneumonia vaccine recs per guidelines.  I can discuss this with her more at her next appt.

## 2021-06-07 NOTE — Telephone Encounter (Signed)
Patient is aware and ok with this

## 2021-06-08 ENCOUNTER — Telehealth: Payer: PPO

## 2021-06-08 ENCOUNTER — Telehealth: Payer: Self-pay | Admitting: *Deleted

## 2021-06-08 NOTE — Chronic Care Management (AMB) (Signed)
  Care Management   Note  06/08/2021 Name: Kimberly Roy MRN: [id number] DOB: 1931-05-20  [name] is a 87 y.o. year old female who is a primary care patient of Einar Pheasant, MD and is actively engaged with the care management team. I reached out to [name] by phone today to assist with re-scheduling a follow up visit with the RN Case Manager  Follow up plan: Unsuccessful telephone outreach attempt made. A HIPAA compliant phone message was left for the patient providing contact information and requesting a return call.  The care management team will reach out to the patient again over the next 7 days.  If patient returns call to provider office, please advise to call [facility] at [phone].  [place] Management  Direct Dial: [phone]

## 2021-06-11 DIAGNOSIS — Z9889 Other specified postprocedural states: Secondary | ICD-10-CM | POA: Diagnosis not present

## 2021-06-11 DIAGNOSIS — I350 Nonrheumatic aortic (valve) stenosis: Secondary | ICD-10-CM | POA: Diagnosis not present

## 2021-06-11 DIAGNOSIS — R001 Bradycardia, unspecified: Secondary | ICD-10-CM | POA: Diagnosis not present

## 2021-06-11 DIAGNOSIS — I272 Pulmonary hypertension, unspecified: Secondary | ICD-10-CM | POA: Diagnosis not present

## 2021-06-11 DIAGNOSIS — E7800 Pure hypercholesterolemia, unspecified: Secondary | ICD-10-CM | POA: Diagnosis not present

## 2021-06-11 DIAGNOSIS — I5032 Chronic diastolic (congestive) heart failure: Secondary | ICD-10-CM | POA: Diagnosis not present

## 2021-06-11 DIAGNOSIS — I10 Essential (primary) hypertension: Secondary | ICD-10-CM | POA: Diagnosis not present

## 2021-06-13 ENCOUNTER — Ambulatory Visit
Admission: RE | Admit: 2021-06-13 | Discharge: 2021-06-13 | Disposition: A | Discharge status: Home / Self Care | Payer: PPO | Source: Ambulatory Visit | Attending: Gastroenterology | Admitting: Gastroenterology

## 2021-06-13 ENCOUNTER — Other Ambulatory Visit: Payer: Self-pay

## 2021-06-13 DIAGNOSIS — R1084 Generalized abdominal pain: Secondary | ICD-10-CM | POA: Insufficient documentation

## 2021-06-13 DIAGNOSIS — R109 Unspecified abdominal pain: Secondary | ICD-10-CM | POA: Diagnosis not present

## 2021-06-13 DIAGNOSIS — K5909 Other constipation: Secondary | ICD-10-CM | POA: Diagnosis not present

## 2021-06-13 MED ORDER — IOHEXOL 350 MG/ML SOLN
75.0000 mL | Freq: Once | INTRAVENOUS | Status: AC | PRN
  Administered 2021-06-13: 75 mL via INTRAVENOUS

## 2021-06-15 DIAGNOSIS — R339 Retention of urine, unspecified: Secondary | ICD-10-CM | POA: Diagnosis not present

## 2021-06-15 NOTE — Chronic Care Management (AMB) (Signed)
  Care Management   Note  06/15/2021 Name: Kimberly Roy MRN: [id number] DOB: 11-07-1930  Kimberly Roy is a 87 y.o. year old female who is a primary care patient of Einar Pheasant, MD and is actively engaged with the care management team. I reached out to [name] by phone today to assist with re-scheduling a follow up visit with the RN Case Manager  Follow up plan: Telephone appointment with care management team member scheduled for:06/29/21  [place] Management  Direct Dial: [phone]

## 2021-06-18 ENCOUNTER — Other Ambulatory Visit: Payer: Self-pay | Admitting: Internal Medicine

## 2021-06-18 DIAGNOSIS — S76312A Strain of muscle, fascia and tendon of the posterior muscle group at thigh level, left thigh, initial encounter: Secondary | ICD-10-CM | POA: Diagnosis not present

## 2021-06-18 DIAGNOSIS — M19011 Primary osteoarthritis, right shoulder: Secondary | ICD-10-CM | POA: Diagnosis not present

## 2021-06-18 DIAGNOSIS — Q675 Congenital deformity of spine: Secondary | ICD-10-CM | POA: Diagnosis not present

## 2021-06-18 DIAGNOSIS — M19012 Primary osteoarthritis, left shoulder: Secondary | ICD-10-CM | POA: Diagnosis not present

## 2021-06-18 DIAGNOSIS — M25512 Pain in left shoulder: Secondary | ICD-10-CM | POA: Diagnosis not present

## 2021-06-18 DIAGNOSIS — M25511 Pain in right shoulder: Secondary | ICD-10-CM | POA: Diagnosis not present

## 2021-06-18 DIAGNOSIS — M48061 Spinal stenosis, lumbar region without neurogenic claudication: Secondary | ICD-10-CM | POA: Diagnosis not present

## 2021-06-18 DIAGNOSIS — M519 Unspecified thoracic, thoracolumbar and lumbosacral intervertebral disc disorder: Secondary | ICD-10-CM | POA: Diagnosis not present

## 2021-06-18 DIAGNOSIS — K5903 Drug induced constipation: Secondary | ICD-10-CM | POA: Diagnosis not present

## 2021-06-18 DIAGNOSIS — Z79899 Other long term (current) drug therapy: Secondary | ICD-10-CM | POA: Diagnosis not present

## 2021-06-18 DIAGNOSIS — M5416 Radiculopathy, lumbar region: Secondary | ICD-10-CM | POA: Diagnosis not present

## 2021-06-19 ENCOUNTER — Other Ambulatory Visit: Payer: Self-pay | Admitting: Internal Medicine

## 2021-06-27 ENCOUNTER — Inpatient Hospital Stay: Payer: PPO | Attending: Internal Medicine

## 2021-06-27 ENCOUNTER — Inpatient Hospital Stay: Payer: PPO | Admitting: Internal Medicine

## 2021-06-27 ENCOUNTER — Inpatient Hospital Stay: Payer: PPO

## 2021-06-27 ENCOUNTER — Encounter: Payer: Self-pay | Admitting: Internal Medicine

## 2021-06-27 ENCOUNTER — Other Ambulatory Visit: Payer: Self-pay

## 2021-06-27 VITALS — BP 117/61 | HR 50 | Temp 96.2°F | Resp 16 | Wt 174.7 lb

## 2021-06-27 DIAGNOSIS — N281 Cyst of kidney, acquired: Secondary | ICD-10-CM | POA: Insufficient documentation

## 2021-06-27 DIAGNOSIS — D500 Iron deficiency anemia secondary to blood loss (chronic): Secondary | ICD-10-CM

## 2021-06-27 DIAGNOSIS — I272 Pulmonary hypertension, unspecified: Secondary | ICD-10-CM | POA: Insufficient documentation

## 2021-06-27 DIAGNOSIS — I110 Hypertensive heart disease with heart failure: Secondary | ICD-10-CM | POA: Diagnosis not present

## 2021-06-27 DIAGNOSIS — M2550 Pain in unspecified joint: Secondary | ICD-10-CM | POA: Insufficient documentation

## 2021-06-27 DIAGNOSIS — D649 Anemia, unspecified: Secondary | ICD-10-CM

## 2021-06-27 DIAGNOSIS — Z8719 Personal history of other diseases of the digestive system: Secondary | ICD-10-CM | POA: Insufficient documentation

## 2021-06-27 DIAGNOSIS — D1779 Benign lipomatous neoplasm of other sites: Secondary | ICD-10-CM | POA: Diagnosis not present

## 2021-06-27 DIAGNOSIS — Z9049 Acquired absence of other specified parts of digestive tract: Secondary | ICD-10-CM | POA: Diagnosis not present

## 2021-06-27 DIAGNOSIS — R5383 Other fatigue: Secondary | ICD-10-CM | POA: Insufficient documentation

## 2021-06-27 DIAGNOSIS — I700 Atherosclerosis of aorta: Secondary | ICD-10-CM | POA: Diagnosis not present

## 2021-06-27 DIAGNOSIS — Z79899 Other long term (current) drug therapy: Secondary | ICD-10-CM | POA: Insufficient documentation

## 2021-06-27 DIAGNOSIS — R109 Unspecified abdominal pain: Secondary | ICD-10-CM | POA: Insufficient documentation

## 2021-06-27 DIAGNOSIS — D539 Nutritional anemia, unspecified: Secondary | ICD-10-CM | POA: Diagnosis not present

## 2021-06-27 DIAGNOSIS — E039 Hypothyroidism, unspecified: Secondary | ICD-10-CM | POA: Insufficient documentation

## 2021-06-27 DIAGNOSIS — Z882 Allergy status to sulfonamides status: Secondary | ICD-10-CM | POA: Insufficient documentation

## 2021-06-27 DIAGNOSIS — G4733 Obstructive sleep apnea (adult) (pediatric): Secondary | ICD-10-CM | POA: Insufficient documentation

## 2021-06-27 DIAGNOSIS — I509 Heart failure, unspecified: Secondary | ICD-10-CM | POA: Insufficient documentation

## 2021-06-27 DIAGNOSIS — K429 Umbilical hernia without obstruction or gangrene: Secondary | ICD-10-CM | POA: Diagnosis not present

## 2021-06-27 DIAGNOSIS — M549 Dorsalgia, unspecified: Secondary | ICD-10-CM | POA: Diagnosis not present

## 2021-06-27 DIAGNOSIS — M8580 Other specified disorders of bone density and structure, unspecified site: Secondary | ICD-10-CM | POA: Diagnosis not present

## 2021-06-27 DIAGNOSIS — Z888 Allergy status to other drugs, medicaments and biological substances status: Secondary | ICD-10-CM | POA: Insufficient documentation

## 2021-06-27 DIAGNOSIS — Z86718 Personal history of other venous thrombosis and embolism: Secondary | ICD-10-CM | POA: Diagnosis not present

## 2021-06-27 DIAGNOSIS — Z881 Allergy status to other antibiotic agents status: Secondary | ICD-10-CM | POA: Diagnosis not present

## 2021-06-27 DIAGNOSIS — Z823 Family history of stroke: Secondary | ICD-10-CM | POA: Insufficient documentation

## 2021-06-27 DIAGNOSIS — Z8249 Family history of ischemic heart disease and other diseases of the circulatory system: Secondary | ICD-10-CM | POA: Insufficient documentation

## 2021-06-27 DIAGNOSIS — R911 Solitary pulmonary nodule: Secondary | ICD-10-CM | POA: Insufficient documentation

## 2021-06-27 DIAGNOSIS — M1990 Unspecified osteoarthritis, unspecified site: Secondary | ICD-10-CM | POA: Diagnosis not present

## 2021-06-27 LAB — COMPREHENSIVE METABOLIC PANEL
ALT: 15 U/L (ref 0–44)
AST: 20 U/L (ref 15–41)
Albumin: 4 g/dL (ref 3.5–5.0)
Alkaline Phosphatase: 72 U/L (ref 38–126)
Anion gap: 6 (ref 5–15)
BUN: 24 mg/dL — ABNORMAL HIGH (ref 8–23)
CO2: 33 mmol/L — ABNORMAL HIGH (ref 22–32)
Calcium: 9.3 mg/dL (ref 8.9–10.3)
Chloride: 98 mmol/L (ref 98–111)
Creatinine, Ser: 0.93 mg/dL (ref 0.44–1.00)
GFR, Estimated: 59 mL/min — ABNORMAL LOW (ref >60)
Glucose, Bld: 76 mg/dL (ref 70–99)
Potassium: 4.4 mmol/L (ref 3.5–5.1)
Sodium: 137 mmol/L (ref 135–145)
Total Bilirubin: 0.7 mg/dL (ref 0.3–1.2)
Total Protein: 6.5 g/dL (ref 6.5–8.1)

## 2021-06-27 LAB — CBC WITH DIFFERENTIAL/PLATELET
Abs Immature Granulocytes: 0.03 10*3/uL (ref 0.00–0.07)
Basophils Absolute: 0 10*3/uL (ref 0.0–0.1)
Basophils Relative: 0 %
Eosinophils Absolute: 0.2 10*3/uL (ref 0.0–0.5)
Eosinophils Relative: 3 %
HCT: 32.2 % — ABNORMAL LOW (ref 36.0–46.0)
Hemoglobin: 10.4 g/dL — ABNORMAL LOW (ref 12.0–15.0)
Immature Granulocytes: 0 %
Lymphocytes Relative: 26 %
Lymphs Abs: 1.8 10*3/uL (ref 0.7–4.0)
MCH: 33.4 pg (ref 26.0–34.0)
MCHC: 32.3 g/dL (ref 30.0–36.0)
MCV: 103.5 fL — ABNORMAL HIGH (ref 80.0–100.0)
Monocytes Absolute: 0.7 10*3/uL (ref 0.1–1.0)
Monocytes Relative: 11 %
Neutro Abs: 4 10*3/uL (ref 1.7–7.7)
Neutrophils Relative %: 60 %
Platelets: 155 10*3/uL (ref 150–400)
RBC: 3.11 MIL/uL — ABNORMAL LOW (ref 3.87–5.11)
RDW: 13.2 % (ref 11.5–15.5)
WBC: 6.7 10*3/uL (ref 4.0–10.5)
nRBC: 0 % (ref 0.0–0.2)

## 2021-06-27 MED ORDER — SODIUM CHLORIDE 0.9 % IV SOLN: 200.0000 mg | Freq: Once | INTRAVENOUS | Status: DC

## 2021-06-27 MED ORDER — SODIUM CHLORIDE 0.9 % IV SOLN
Freq: Once | INTRAVENOUS | Status: AC
Start: 2021-06-27 — End: 2021-06-27
  Filled 2021-06-27: qty 250

## 2021-06-27 MED ORDER — IRON SUCROSE 20 MG/ML IV SOLN
200.0000 mg | Freq: Once | INTRAVENOUS | Status: AC
  Administered 2021-06-27: 200 mg via INTRAVENOUS
  Filled 2021-06-27: qty 10

## 2021-06-27 NOTE — Progress Notes (Signed)
**Kimberly Kimberly** Kimberly Kimberly  Patient Care Team: Einar Pheasant, [name], [name], [name], [name], [name], [name]  CHIEF COMPLAINTS/PURPOSE OF CONSULTATION: Anemia  # CHRONIC MILD ANEMIA hb ~11.[Previous Dr.Gittin pt]; Effort 2017- work up [employer]; stool card; Iron/ monoclonal work up- NEGATIVE] ; MARCH 2022- B12 WNL.   # CHRONIC INTERSTITIAL CYSTITIS/ wheel chair bound  Oncology History   No history exists.     HISTORY OF PRESENTING ILLNESS:  Kimberly Kimberly 87 y.o.  female  with a history of chronic anemia- is here for a follow up.   Complains of moderate to severe fatigue.  Denies any blood in stools or black-colored stools.  No nausea no vomiting.  No abdominal pain.  No easy bleeding/bruising noted.   Review of Systems  Constitutional:  Positive for malaise/fatigue. Negative for chills, diaphoresis and fever.  HENT:  Negative for nosebleeds and sore throat.   Eyes:  Negative for double vision.  Respiratory:  Negative for cough, hemoptysis, sputum production, shortness of breath and wheezing.   Cardiovascular:  Negative for chest pain, palpitations, orthopnea and leg swelling.  Gastrointestinal:  Negative for abdominal pain, blood in stool, constipation, diarrhea, heartburn, melena, nausea and vomiting.  Genitourinary:  Negative for dysuria, frequency and urgency.  Musculoskeletal:  Positive for back pain and joint pain.  Skin: Negative.  Negative for itching and rash.  Neurological:  Negative for dizziness, tingling, focal weakness, weakness and headaches.  Endo/Heme/Allergies:  Does not bruise/bleed easily.  Psychiatric/Behavioral:  Negative for depression. The patient is not nervous/anxious and does not have insomnia.     MEDICAL  HISTORY:  Past Medical History:  Diagnosis Date  . Anemia   . Anxiety   . Chest pain   . CHF (congestive heart failure) ([place])   . Constipation   . DDD (degenerative disc disease), cervical   . Depression   . DVT (deep venous thrombosis) ([place])   . Dysphonia   . Dyspnea   . Fatty liver   . Fatty liver   . Headache   . Hyperlipidemia   . Hyperpiesia   . Hypertension   . Hypothyroidism   . Interstitial cystitis   . Left ventricular dysfunction   . Lymphedema   . Nephrolithiasis   . Obstructive sleep apnea   . Osteoarthritis    knees/cervical and lumbar spine  . Pulmonary hypertension ([place])   . Pulmonary nodules    followed by Dr Raul Del  . Pure hypercholesterolemia   . Renal cyst    right    SURGICAL HISTORY: Past Surgical History:  Procedure Laterality Date  . ABDOMINAL HYSTERECTOMY     ovaries left in place  . APPENDECTOMY    . Back Surgeries    . BACK SURGERY    . BREAST REDUCTION SURGERY     3/99  . CARDIAC CATHETERIZATION    . cataracts Bilateral   . CERVICAL SPINE SURGERY    . ESOPHAGEAL MANOMETRY N/A 08/02/2015   Procedure: ESOPHAGEAL MANOMETRY (EM);  Surgeon: Josefine Class, MD;  Location: [facility] ENDOSCOPY;  Service: Endoscopy;  Laterality: N/A;  . ESOPHAGOGASTRODUODENOSCOPY N/A 02/27/2015   Procedure: ESOPHAGOGASTRODUODENOSCOPY (EGD);  Surgeon: Hulen Luster, MD;  Location: [facility] ENDOSCOPY;  Service: Gastroenterology;  Laterality: N/A;  . ESOPHAGOGASTRODUODENOSCOPY (EGD) WITH PROPOFOL N/A 10/30/2018   Procedure: ESOPHAGOGASTRODUODENOSCOPY (EGD) WITH  PROPOFOL;  Surgeon: Lollie Sails, MD;  Location: [facility] ENDOSCOPY;  Service: Endoscopy;  Laterality: N/A;  . EXCISIONAL HEMORRHOIDECTOMY    . EYE SURGERY    . FRACTURE SURGERY    . HEMORRHOID SURGERY    . HIP SURGERY  2013   Right hip surgery  . JOINT REPLACEMENT    . KNEE ARTHROSCOPY     left and right  . ORIF FEMUR FRACTURE Right 08/07/2018   Procedure: OPEN REDUCTION INTERNAL FIXATION (ORIF) DISTAL  FEMUR FRACTURE;  Surgeon: Hessie Knows, MD;  Location: [facility] ORS;  Service: Orthopedics;  Laterality: Right;  . REDUCTION MAMMAPLASTY Bilateral YRS AGO  . REPLACEMENT TOTAL KNEE Bilateral   . rotator cuff surgery     blilateral  . TONSILECTOMY/ADENOIDECTOMY WITH MYRINGOTOMY    . VISCERAL ARTERY INTERVENTION N/A 02/25/2019   Procedure: VISCERAL ARTERY INTERVENTION;  Surgeon: Algernon Huxley, MD;  Location: [facility];  Service: Cardiovascular;  Laterality: N/A;    SOCIAL HISTORY: Social History   Socioeconomic History  . Marital status: Widowed    Spouse name: Not on file  . Number of children: 2  . Years of education: 33  . Highest education level: 12th grade  Occupational History    Comment: insurance agency  Tobacco Use  . Smoking status: Never  . Smokeless tobacco: Never  Vaping Use  . Vaping Use: Never used  Substance and Sexual Activity  . Alcohol use: No    Alcohol/week: 0.0 standard drinks  . Drug use: Never  . Sexual activity: Not Currently  Other Topics Concern  . Not on file  Social History Narrative  . Not on file   Social Determinants of Health   Financial Resource Strain: Low Risk   . Difficulty of Paying Living Expenses: Not hard at all  Food Insecurity: No Food Insecurity  . Worried About Charity fundraiser in the Last Year: Never true  . Ran Out of Food in the Last Year: Never true  Transportation Needs: No Transportation Needs  . Lack of Transportation (Medical): No  . Lack of Transportation (Non-Medical): No  Physical Activity: Inactive  . Days of Exercise per Week: 0 days  . Minutes of Exercise per Session: 0 min  Stress: No Stress Concern Present  . Feeling of Stress : Only a little  Social Connections: Socially Isolated  . Frequency of Communication with Friends and Family: Three times a week  . Frequency of Social Gatherings with Friends and Family: Twice a week  . Attends Religious Services: Never  . Active Member of Clubs or  Organizations: No  . Attends Archivist Meetings: Never  . Marital Status: Widowed  Intimate Partner Violence: Not At Risk  . Fear of Current or Ex-Partner: No  . Emotionally Abused: No  . Physically Abused: No  . Sexually Abused: No    FAMILY HISTORY: Family History  Problem Relation Age of Onset  . Heart disease Mother   . Stroke Mother   . Hypertension Mother   . Heart disease Father        myocardial infarction age 64  . Breast cancer Neg Hx     ALLERGIES:  is allergic to lyrica [pregabalin], omnicef [cefdinir], atarax [hydroxyzine], dicyclomine, hydroxyzine hcl, levaquin [levofloxacin], nitrofurantoin, nucynta er [tapentadol hcl er], oxybutynin, zoloft [sertraline hcl], biaxin [clarithromycin], sertraline, sulfa antibiotics, sulfasalazine, tape, and tapentadol.  MEDICATIONS:  Current Outpatient Medications  Medication Sig Dispense Refill  . albuterol (VENTOLIN HFA) 108 (90 Base) MCG/ACT inhaler  INHALE 2 PUFFS INTO THE LUNGS EVERY 6 (SIX) HOURS AS NEEDED FOR WHEEZING OR SHORTNESS OF BREATH 18 g 0  . aluminum-magnesium hydroxide-simethicone (MAALOX) 161-096-04 MG/5ML SUSP Take 15 mLs by mouth 4 (four) times daily -  before meals and at bedtime.     Marland Kitchen amLODipine (NORVASC) 10 MG tablet TAKE 1 TABLET BY MOUTH DAILY 90 tablet 1  . aspirin 81 MG EC tablet Take by mouth.    Marland Kitchen atorvastatin (LIPITOR) 20 MG tablet Take 1 tablet (20 mg total) by mouth at bedtime. 90 tablet 1  . Azelastine HCl 137 MCG/SPRAY SOLN PLACE 1 SPRAY INTO BOTH NOSTRILS TWO TIMES DAILY 30 mL 1  . benazepril (LOTENSIN) 40 MG tablet TAKE 1 TABLET BY MOUTH DAILY 90 tablet 1  . betamethasone dipropionate 0.05 % cream Apply 1 application topically 2 (two) times daily.    . budesonide-formoterol (SYMBICORT) 80-4.5 MCG/ACT inhaler Inhale 2 puffs into the lungs 2 (two) times daily. 1 each 12  . busPIRone (BUSPAR) 10 MG tablet Take 10 mg by mouth 2 (two) times daily.     . calcium citrate-vitamin D (CITRACAL+D)  315-200 MG-UNIT tablet Take 1 tablet by mouth daily.    . cephALEXin (KEFLEX) 250 MG capsule Take 250 mg by mouth daily.    . Cholecalciferol (VITAMIN D3) 1000 UNITS CAPS Take 1 capsule by mouth daily.    . Diclofenac Sodium 3 % GEL Apply topically 2 (two) times daily.    Mariane Baumgarten Sodium (DSS) 100 MG CAPS Take by mouth.    . DULoxetine (CYMBALTA) 60 MG capsule TAKE 1 CAPSULE BY MOUTH EVERY DAY. 30 capsule 3  . ELMIRON 100 MG capsule Take 100 mg by mouth 3 (three) times daily.    . fluocinonide (LIDEX) 0.05 % external solution Apply topically.    . furosemide (LASIX) 20 MG tablet TAKE 1 TABLET BY MOUTH DAILY AS NEEDED 90 tablet 0  . gabapentin (NEURONTIN) 300 MG capsule TAKE 2 CAPSULES BY MOUTH 3 TIMES A DAY. 540 capsule 2  . HYDROcodone-acetaminophen (NORCO) 10-325 MG tablet Take 1 tablet by mouth 3 (three) times daily. 10 tablet 0  . hydrOXYzine (ATARAX/VISTARIL) 25 MG tablet Take 25 mg by mouth daily.    Marland Kitchen ketoconazole (NIZORAL) 2 % shampoo Apply 1 application topically 2 (two) times a week.    . magnesium oxide (MAG-OX) 400 MG tablet Take 1 tablet (400 mg total) by mouth daily. 30 tablet 1  . Melatonin 10 MG CAPS Take 1 capsule by mouth.    . methocarbamol (ROBAXIN) 500 MG tablet Take 500 mg by mouth 3 (three) times daily. As needed    . metoprolol succinate (TOPROL-XL) 50 MG 24 hr tablet Take 1 tablet (50 mg total) by mouth daily. Take with or immediately following a meal. 90 tablet 1  . montelukast (SINGULAIR) 10 MG tablet 10 mg once daily as needed    . morphine (MS CONTIN) 60 MG 12 hr tablet Take 1 tablet (60 mg total) by mouth 2 (two) times daily. 10 tablet 0  . mupirocin ointment (BACTROBAN) 2 % Apply 1 application topically 2 (two) times daily. 22 g 0  . Naldemedine Tosylate 0.2 MG TABS Take 0.2 mg by mouth daily.    Marland Kitchen NYAMYC powder Apply topically.    . polyethylene glycol powder (GLYCOLAX/MIRALAX) 17 GM/SCOOP powder MIX 17 GRAMS AS MARKED ON BOTTLE TOP IN 8 OUNCES OF WATER AND  DRINK ONCE A DAY AS DIRECTED. 527 g 0  . RABEprazole (ACIPHEX) 20  MG tablet TAKE 1 TABLET BY MOUTH TWICE A DAY BEFORE A MEAL 180 tablet 1  . senna (SENOKOT) 8.6 MG TABS tablet Take 1 tablet by mouth 2 (two) times daily.    . Simethicone (GAS-X PO) Take 2 tablets by mouth 2 (two) times daily.    . sodium chloride (OCEAN) 0.65 % nasal spray Place 1 spray into the nose as needed.    Marland Kitchen SYNTHROID 100 MCG tablet Take 1 tablet (100 mcg total) by mouth daily. 90 tablet 3  . traZODone (DESYREL) 50 MG tablet Take 25-50 mg by mouth at bedtime as needed.    . triamcinolone ointment (KENALOG) 0.1 % SMARTSIG:1 Application Topical 2x/day 30 g 0   No current facility-administered medications for this visit.      Marland Kitchen  PHYSICAL EXAMINATION: ECOG PERFORMANCE STATUS: 0 - Asymptomatic  Vitals:   06/27/21 1309  BP: 117/61  Pulse: (!) 50  Resp: 16  Temp: (!) 96.2 F (35.7 C)   Filed Weights   06/27/21 1309  Weight: 174 lb 11.2 oz (79.2 kg)    Physical Exam Constitutional:      Comments: Patient in wheelchair because of chronic arthritic problems.  HENT:     Head: Normocephalic and atraumatic.     Mouth/Throat:     Pharynx: No oropharyngeal exudate.  Eyes:     Pupils: Pupils are equal, round, and reactive to light.  Cardiovascular:     Rate and Rhythm: Normal rate and regular rhythm.  Pulmonary:     Effort: Pulmonary effort is normal. No respiratory distress.     Breath sounds: Normal breath sounds. No wheezing.  Abdominal:     General: Bowel sounds are normal. There is no distension.     Palpations: Abdomen is soft. There is no mass.     Tenderness: There is no abdominal tenderness. There is no guarding or rebound.  Musculoskeletal:        General: No tenderness. Normal range of motion.     Cervical back: Normal range of motion and neck supple.  Skin:    General: Skin is warm.  Neurological:     Mental Status: She is alert and oriented to person, place, and time.  Psychiatric:         Mood and Affect: Affect normal.    LABORATORY DATA:  I have reviewed the data as listed Lab Results  Component Value Date   WBC 6.7 06/27/2021   HGB 10.4 (L) 06/27/2021   HCT 32.2 (L) 06/27/2021   MCV 103.5 (H) 06/27/2021   PLT 155 06/27/2021   Recent Labs    10/17/20 1253 10/31/20 0949 12/27/20 1256 01/24/21 1656 03/28/21 1405 05/14/21 1351 06/27/21 1236  NA 140 140   < > 139 136 143 137  K 4.7 4.8   < > 4.2 4.3 4.4 4.4  CL 101 101   < > 98 101 100 98  CO2 34* 34*   < > 31 27 35* 33*  GLUCOSE 95 87   < > 96 122* 86 76  BUN 19 24*   < > 18 25* 16 24*  CREATININE 0.92 1.00   < > 0.84 1.00 0.77 0.93  CALCIUM 10.0 10.0   < > 9.9 9.2 10.2 9.3  GFRNONAA  --   --    < > >60 54*  --  59*  PROT 6.5 6.2  --  6.8  --  6.4 6.5  ALBUMIN 3.9 3.9  --  4.0  --  4.0 4.0  AST 17 15  --  20  --  17 20  ALT 11 10  --  13  --  11 15  ALKPHOS 91 83  --  86  --  78 72  BILITOT 0.5 0.5  --  0.6  --  0.4 0.7  BILIDIR 0.1 0.1  --   --   --  0.1  --    < > = values in this interval not displayed.    RADIOGRAPHIC STUDIES: I have personally reviewed the radiological images as listed and agreed with the findings in the report. CT ABDOMEN PELVIS W CONTRAST  Result Date: 06/18/2021 CLINICAL DATA:  Abdominal pain for several months EXAM: CT ABDOMEN AND PELVIS WITH CONTRAST TECHNIQUE: Multidetector CT imaging of the abdomen and pelvis was performed using the standard protocol following bolus administration of intravenous contrast. CONTRAST:  51m OMNIPAQUE IOHEXOL 350 MG/ML SOLN COMPARISON:  CT Abdomen and Pelvis 11/16/2020. FINDINGS: Lower chest: Unchanged nodules in the included lung bases including a 6 mm left lower lobe nodule which is longstanding and considered benign. No pleural effusion. Mild cardiomegaly. Hepatobiliary: No focal liver abnormality is seen. Mild chronic intrahepatic biliary prominence is unchanged. There is no significant extrahepatic biliary dilatation, and the gallbladder is  unremarkable. Pancreas: Unremarkable. Spleen: Unremarkable. Adrenals/Urinary Tract: Unchanged 1.5 cm right adrenal myelolipoma. Unremarkable left adrenal gland. Similar appearance of right renal cysts measuring up to 5.2 cm. Subcentimeter hypodensities in both kidneys, too small to fully characterize. Mild bilateral renal cortical thinning. No renal calculi or hydronephrosis. Unremarkable bladder. Stomach/Bowel: The stomach is unremarkable. There is no evidence of bowel obstruction or inflammation. Prior appendectomy. Vascular/Lymphatic: Abdominal aortic atherosclerosis without aneurysm. No enlarged lymph nodes. Reproductive: Status post hysterectomy. No adnexal masses. Other: Unchanged small fat containing umbilical hernia. No ascites or pneumoperitoneum. Musculoskeletal: ORIF of the right femur. Prior posterior decompression and fusion in the lumbar spine. Partially visualized spinal stimulator extending towards the thoracic spine. Diffuse osteopenia. IMPRESSION: 1. No acute abnormality identified in the abdomen or pelvis. 2. Unchanged right adrenal myelolipoma. 3. Aortic Atherosclerosis (ICD10-I70.0). Electronically Signed   By: ALogan BoresM.D.   On: 06/18/2021 11:39    ASSESSMENT & PLAN:   Symptomatic anemia #  Chronic anemia-unclear etiology-   Mild symptoms of ongoing fatigue.  Hemoglobin today is 10.5. April 2022-ferritin 42 saturation 25%.-given the clinical response to IV Venofer continue Venofer today.  See discussion below  #Etiology of macrocytic anemia is unclear: Again discussed with patient regarding possibility of low-grade leukemia/MDS.  Discussed the bone marrow biopsy procedure.  Patient reluctant/might consider in 3 months.  Again is reasonable to hold off bone marrow biopsy given age hemoglobin greater than 10 and limited options.  # DISPOSITION:  # venofer today # follow up in 3 months- MD; labs-cbc/bmp; iron studies/ferritin; epo level; LDH--possible venofer-Dr.B    GCammie Sickle MD 06/27/2021 1:53 PM

## 2021-06-27 NOTE — Addendum Note (Signed)
Addended by: Levada Schilling on: 06/27/2021 02:41 PM   Modules accepted: Orders

## 2021-06-27 NOTE — Patient Instructions (Signed)

## 2021-06-27 NOTE — Assessment & Plan Note (Signed)
#  Chronic anemia-unclear etiology-   Mild symptoms of ongoing fatigue.  Hemoglobin today is 10.5. April 2022-ferritin 42 saturation 25%.-given the clinical response to IV Venofer continue Venofer today.  See discussion below  #Etiology of macrocytic anemia is unclear: Again discussed with patient regarding possibility of low-grade leukemia/MDS.  Discussed the bone marrow biopsy procedure.  Patient reluctant/might consider in 3 months.  Again is reasonable to hold off bone marrow biopsy given age hemoglobin greater than 10 and limited options.  # DISPOSITION:  # venofer today # follow up in 3 months- MD; labs-cbc/bmp; iron studies/ferritin; epo level; LDH--possible venofer-Dr.B

## 2021-06-27 NOTE — Progress Notes (Signed)
Patient's energy has has not improved.

## 2021-06-29 ENCOUNTER — Ambulatory Visit (INDEPENDENT_AMBULATORY_CARE_PROVIDER_SITE_OTHER): Payer: PPO | Admitting: *Deleted

## 2021-06-29 DIAGNOSIS — M5440 Lumbago with sciatica, unspecified side: Secondary | ICD-10-CM

## 2021-06-29 DIAGNOSIS — I10 Essential (primary) hypertension: Secondary | ICD-10-CM

## 2021-06-29 DIAGNOSIS — M25511 Pain in right shoulder: Secondary | ICD-10-CM

## 2021-06-29 DIAGNOSIS — G8929 Other chronic pain: Secondary | ICD-10-CM

## 2021-07-01 NOTE — Chronic Care Management (AMB) (Signed)
Chronic Care Management   CCM RN Visit Note  07/01/2021 Name: Kimberly Roy MRN: [id number] DOB: 1931/07/07  Subjective: Kimberly Roy is a 87 y.o. year old female who is a primary care patient of Einar Pheasant, MD. The care management team was consulted for assistance with disease management and care coordination needs.    Engaged with patient by telephone for follow up visit in response to provider referral for case management and/or care coordination services.   Consent to Services:  The patient was given information about Chronic Care Management services, agreed to services, and gave verbal consent prior to initiation of services.  Please see initial visit note for detailed documentation.   Patient agreed to services and verbal consent obtained.   Assessment: Review of patient past medical history, allergies, medications, health status, including review of consultants reports, laboratory and other test data, was performed as part of comprehensive evaluation and provision of chronic care management services.   SDOH (Social Determinants of Health) assessments and interventions performed:    CCM Care Plan  Allergies  Allergen Reactions   Lyrica [Pregabalin] Swelling   Omnicef [Cefdinir] Diarrhea and Nausea And Vomiting   Atarax [Hydroxyzine]     jittery   Dicyclomine Other (See Comments)    Abdominal bloating    Hydroxyzine Hcl     jittery   Levaquin [Levofloxacin] Swelling   Nitrofurantoin Diarrhea   Nucynta Er [Tapentadol Hcl Er] Other (See Comments)    Severe constipation    Oxybutynin Other (See Comments)    Blurred vision   Zoloft [Sertraline Hcl]     Severe headache   Biaxin [Clarithromycin] Other (See Comments) and Rash    Pt does not remember Pt does not remember   Sertraline Nausea And Vomiting    Severe headache Severe headache Other reaction(s): Headache Severe headache   Sulfa Antibiotics Rash    Pt does not remember   Sulfasalazine Rash    Pt does not  remember   Tape Rash    Durabond - redness   Tapentadol Other (See Comments) and Rash    _0     Outpatient Encounter Medications as of 06/29/2021  Medication Sig Note   albuterol (VENTOLIN HFA) 108 (90 Base) MCG/ACT inhaler INHALE 2 PUFFS INTO THE LUNGS EVERY 6 (SIX) HOURS AS NEEDED FOR WHEEZING OR SHORTNESS OF BREATH    aluminum-magnesium hydroxide-simethicone (MAALOX) 200-200-20 MG/5ML SUSP Take 15 mLs by mouth 4 (four) times daily -  before meals and at bedtime.  02/09/2021: Reports taking as needed   amLODipine (NORVASC) 10 MG tablet TAKE 1 TABLET BY MOUTH DAILY    aspirin 81 MG EC tablet Take by mouth.    atorvastatin (LIPITOR) 20 MG tablet Take 1 tablet (20 mg total) by mouth at bedtime.    Azelastine HCl 137 MCG/SPRAY SOLN PLACE 1 SPRAY INTO BOTH NOSTRILS TWO TIMES DAILY    benazepril (LOTENSIN) 40 MG tablet TAKE 1 TABLET BY MOUTH DAILY    betamethasone dipropionate 0.05 % cream Apply 1 application topically 2 (two) times daily. 02/09/2021: Reports uses as needed   budesonide-formoterol (SYMBICORT) 80-4.5 MCG/ACT inhaler Inhale 2 puffs into the lungs 2 (two) times daily. 02/09/2021: Reports not taking at this time   busPIRone (BUSPAR) 10 MG tablet Take 10 mg by mouth 2 (two) times daily.     calcium citrate-vitamin D (CITRACAL+D) 315-200 MG-UNIT tablet Take 1 tablet by mouth daily.    cephALEXin (KEFLEX) 250 MG  capsule Take 250 mg by mouth daily.    Cholecalciferol (VITAMIN D3) 1000 UNITS CAPS Take 1 capsule by mouth daily.    Diclofenac Sodium 3 % GEL Apply topically 2 (two) times daily. 02/09/2021: As needed   Docusate Sodium (DSS) 100 MG CAPS Take by mouth.    DULoxetine (CYMBALTA) 60 MG capsule TAKE 1 CAPSULE BY MOUTH EVERY DAY.    ELMIRON 100 MG capsule Take 100 mg by mouth 3 (three) times daily.    fluocinonide (LIDEX) 0.05 % external solution Apply topically.    furosemide (LASIX)  20 MG tablet TAKE 1 TABLET BY MOUTH DAILY AS NEEDED    gabapentin (NEURONTIN) 300 MG capsule TAKE 2 CAPSULES BY MOUTH 3 TIMES A DAY. 05/14/2021: Takes 3 capsules in the AM, 3 capsules in the evening and 4 at bedtime.    HYDROcodone-acetaminophen (NORCO) 10-325 MG tablet Take 1 tablet by mouth 3 (three) times daily.    hydrOXYzine (ATARAX/VISTARIL) 25 MG tablet Take 25 mg by mouth daily.    ketoconazole (NIZORAL) 2 % shampoo Apply 1 application topically 2 (two) times a week.    magnesium oxide (MAG-OX) 400 MG tablet Take 1 tablet (400 mg total) by mouth daily.    Melatonin 10 MG CAPS Take 1 capsule by mouth.    methocarbamol (ROBAXIN) 500 MG tablet Take 500 mg by mouth 3 (three) times daily. As needed    metoprolol succinate (TOPROL-XL) 50 MG 24 hr tablet Take 1 tablet (50 mg total) by mouth daily. Take with or immediately following a meal.    montelukast (SINGULAIR) 10 MG tablet 10 mg once daily as needed    morphine (MS CONTIN) 60 MG 12 hr tablet Take 1 tablet (60 mg total) by mouth 2 (two) times daily.    mupirocin ointment (BACTROBAN) 2 % Apply 1 application topically 2 (two) times daily.    Naldemedine Tosylate 0.2 MG TABS Take 0.2 mg by mouth daily.    NYAMYC powder Apply topically.    polyethylene glycol powder (GLYCOLAX/MIRALAX) 17 GM/SCOOP powder MIX 17 GRAMS AS MARKED ON BOTTLE TOP IN 8 OUNCES OF WATER AND DRINK ONCE A DAY AS DIRECTED.    RABEprazole (ACIPHEX) 20 MG tablet TAKE 1 TABLET BY MOUTH TWICE A DAY BEFORE A MEAL    senna (SENOKOT) 8.6 MG TABS tablet Take 1 tablet by mouth 2 (two) times daily.    Simethicone (GAS-X PO) Take 2 tablets by mouth 2 (two) times daily.    sodium chloride (OCEAN) 0.65 % nasal spray Place 1 spray into the nose as needed.    SYNTHROID 100 MCG tablet Take 1 tablet (100 mcg total) by mouth daily.    traZODone (DESYREL) 50 MG tablet Take 25-50 mg by mouth at bedtime as needed.    triamcinolone ointment (KENALOG) 0.1 % SMARTSIG:1 Application Topical 2x/day     No facility-administered encounter medications on file as of 06/29/2021.    Patient Active Problem List   Diagnosis Date Noted   Rash 03/03/2021   Skin lesion 03/03/2021   Aortic atherosclerosis ([place]) 11/20/2020   Bradycardia 06/08/2020   Itching 04/08/2020   Dyspnea 01/17/2020   Chronic respiratory failure with hypoxia ([place]) 01/17/2020   Right hip pain 01/10/2020   Leukocytosis 11/15/2019   Wound of buttock 10/25/2019   COVID-19 virus infection 10/04/2019   Urinary frequency 09/26/2019   Cold feeling 09/12/2019   Left knee pain 07/04/2019   Hemoptysis 06/30/2019   Femur fracture ([place]) 08/06/2018   Primary osteoarthritis of right  shoulder 08/15/2017   Right shoulder pain 05/16/2017   Chronic venous insufficiency 02/10/2017   Lymphedema 02/10/2017   Neuropathy 12/25/2016   Anemia due to blood loss, chronic 08/07/2016   GERD (gastroesophageal reflux disease) 12/10/2015   Finger pain 11/13/2015   Carotid artery calcification 09/26/2015   External nasal lesion 09/25/2015   Muscle cramps 09/01/2015   Excessive sweating 07/30/2015   Headache 07/16/2015   Groin pain 05/14/2015   Muscle twitching 03/05/2015   Leg cramps 02/11/2015   Back pain 02/11/2015   Abdominal pain 02/11/2015   Chronic pain 01/19/2015   Acute cystitis without hematuria 12/12/2014   Left elbow pain 11/30/2014   Health care maintenance 11/30/2014   Osteoporosis 10/19/2014   Rectal bleeding 10/19/2014   Neck pain 09/03/2014   Unsteady gait 09/03/2014   Nocturia 09/03/2014   Dysphagia 06/01/2014   Nasal dryness 06/01/2014   Stress 06/01/2014   Fatigue 02/27/2014   Degenerative disc disease 12/21/2013   Pre-op evaluation 10/12/2013   Hoarseness 08/14/2013   Leg swelling 01/24/2013   CHF (congestive heart failure) ([place]) 12/29/2012   Cough 12/29/2012   OSA (obstructive sleep apnea) 07/09/2012   Osteoarthritis 07/04/2012   Symptomatic anemia 07/04/2012   Chronic constipation 07/04/2012    Pulmonary hypertension ([place]) 07/04/2012   Pulmonary nodules 07/04/2012   Hypertension 07/04/2012   Hyperlipidemia 07/04/2012   Hypothyroidism 07/04/2012    Conditions to be addressed/monitored:HTN and Pain  Care Plan : Hypertension (Adult)  Updates made by Leona Singleton, RN since 07/01/2021 12:00 AM     Problem: Hypertension (Hypertension)   Priority: Medium     Long-Range Goal: Hypertension Monitored   Start Date: 02/09/2021  Expected End Date: 08/31/2021  This Visit's Progress: Not on track  Recent Progress: Not on track  Priority: Medium  Note:   Objective:  Last practice recorded BP readings:  BP Readings from Last 3 Encounters:  06/27/21 117/61  05/21/21 124/60  04/27/21 116/72  Current Barriers:  Knowledge Deficits related to basic understanding of hypertension pathophysiology and self care management; patient reports she does not monitor blood pressures often at home.  Does state that when she does it is typically normal.  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  Continues to report not monitoring blood pressures, regularly.  Discussed importance of monitoring blood pressure.  Case Manager Clinical Goal(s):  patient will verbalize understanding of plan for hypertension management patient will demonstrate improved adherence to prescribed treatment plan for hypertension as evidenced by taking all medications as prescribed, monitoring and recording blood pressure as directed, adhering to low sodium/DASH diet Interventions:  Collaboration with Einar Pheasant, MD regarding development and update of comprehensive plan of care as evidenced by provider attestation and co-signature Inter-disciplinary care team collaboration (see longitudinal plan of care) Evaluation of current treatment plan related to hypertension self management and patient's adherence to plan as established by provider. Provided education to patient re: stroke prevention,  s/s of heart attack and stroke, DASH diet, complications of uncontrolled blood pressure Reviewed medications with patient and discussed importance of compliance Discussed plans with patient for ongoing care management follow up and provided patient with direct contact information for care management team Advised patient, providing education and rationale, to monitor blood pressure weekly and record, calling PCP for findings outside established parameters.  Encouraged to keep and attend scheduled medical appointments Encouraged to call provider office for new concerns, questions, or BP outside discussed parameters Encouraged to follow a low  sodium diet/DASH diet Encouraged patient to review list of resources provided by Care Guide for adult day care options and CCM Education officer, museum for [employer]; provided patient with number for [facility] Encouraged to check blood pressure weekly and write blood pressure results in a log to take to appointments for provider review discusssed monitoring more frequently (daily or 3 times a week) if blood pressures are more elevated notifying provider for sustained elevations Discussed importance of monitoring blood pressure while taking muscle relaxant with other pain medications Patient Goals/Self-Care Activities:: Check blood pressure weekly or more frequently if elevated Write blood pressure results in a log and take log to appointments for provider review Continue to work with CCM Social Worker for resources for your son Follow Up Plan: The care management team will reach out to the patient again over the next 45 business days.     Care Plan : Chronic Pain (Adult)  Updates made by Leona Singleton, RN since 07/01/2021 12:00 AM     Problem: Pain Management Plan (Chronic Pain)   Priority: Medium     Long-Range Goal: Pain Management Plan Developed   Start Date: 02/09/2021  Expected End Date: 12/30/2021  This Visit's Progress: On  track  Recent Progress: On track  Priority: Medium  Note:   Current Barriers:  Knowledge Deficits related to self-health management of chronic pain; patient reporting severe pain to shoulder.  States her shoulder is "bone on bone" and she receives injections every 3 months.  Reports pain is causing her to have to sleep in recliner. Feels like current pain is being managed at this time Chronic Disease Management support and education needs related to chronic pain Clinical Goal(s):  patient will verbalize understanding of plan for pain management. , patient will demonstrate use of different relaxation  skills and/or diversional activities to assist with pain reduction (distraction, imagery, relaxation, massage, acupressure, TENS, heat, and cold application., patient will use pharmacological and nonpharmacological pain relief strategies as prescribed. , and patient will verbalize acceptable level of pain relief and ability to engage in desired activities Interventions:  Collaboration with Einar Pheasant, MD regarding development and update of comprehensive plan of care as evidenced by provider attestation and co-signature Pain assessment performed Medications reviewed and encouraged medication compliance Discussed plans with patient for ongoing care management follow up and provided patient with direct contact information for care management team Evaluation of current treatment plan related to pain and patient's adherence to plan as established by provider. Pain assessed and pain treatment goals reviewed Patient response to treatment assessed Discussed using ice and heat to help with pain Acknowledged patient as the expert in pain self-management Discussed urinary bladder training exercises (bladder insulation) and thinks she has pulled her left groin muscle during these therapies Discussed importance of monitor self for alertness/mentation with newly prescribed muscle relaxant with other narcotic  medications No significant interventions updated/changed Patient Goals/Self Care Activities:  Learn relaxation techniques and use relaxation during pain  Practice acceptance of chronic pain Practice relaxation or meditation daily Tell myself I can (not I can't) Use distraction techniques Take medication as prescribed Use Ice and Heat to help with pain Follow Up Plan: The care management team will reach out to the patient again over the next 45 business days.       Plan:The care management team will reach out to the patient again over the next 45 days.  Hubert Azure RN, MSN RN Care Management Coordinator [place] [phone] [email or web address]

## 2021-07-01 NOTE — Patient Instructions (Signed)
Visit Information  PATIENT GOALS:  Goals Addressed             This Visit's Progress    (RNCM) Cope with Chronic Pain   On track    Timeframe:  Long-Range Goal Priority:  Medium Start Date:   02/09/21                          Expected End Date: 08/01/21                      Follow Up Date 12/5//22   Learn relaxation techniques and use relaxation during pain  Practice acceptance of chronic pain Practice relaxation or meditation daily Tell myself I can (not I can't) Use distraction techniques Take medication as prescribed Use Ice and Heat to help with pain   Why is this important?   Stress makes chronic pain feel worse.  Feelings like depression, anxiety, stress and anger can make your body more sensitive to pain.  Learning ways to cope with stress or depression may help you find some relief from the pain.     Notes:      (RNCM) Track and Manage My Blood Pressure-Hypertension   Not on track    Timeframe:  Long-Range Goal Priority:  Medium Start Date:   02/09/21                          Expected End Date: 08/31/21                      Follow Up Date  08/06/21    Check blood pressure weekly or more frequently if elevated Write blood pressure results in a log and take log to appointments for provider review Continue to work with CCM Social Worker for resources for your son   Why is this important?   You won't feel high blood pressure, but it can still hurt your blood vessels.  High blood pressure can cause heart or kidney problems. It can also cause a stroke.  Making lifestyle changes like losing a little weight or eating less salt will help.  Checking your blood pressure at home and at different times of the day can help to control blood pressure.  If the doctor prescribes medicine remember to take it the way the doctor ordered.  Call the office if you cannot afford the medicine or if there are questions about it.     Notes:         Patient verbalizes understanding  of instructions provided today and agrees to view in [place].   The care management team will reach out to the patient again over the next 45 days.  Hubert Azure RN, MSN RN Care Management Coordinator [place] [phone] [email or web address]

## 2021-07-02 DIAGNOSIS — I10 Essential (primary) hypertension: Secondary | ICD-10-CM | POA: Diagnosis not present

## 2021-07-04 DIAGNOSIS — I700 Atherosclerosis of aorta: Secondary | ICD-10-CM | POA: Diagnosis not present

## 2021-07-04 DIAGNOSIS — F419 Anxiety disorder, unspecified: Secondary | ICD-10-CM | POA: Diagnosis not present

## 2021-07-04 DIAGNOSIS — J9611 Chronic respiratory failure with hypoxia: Secondary | ICD-10-CM | POA: Diagnosis not present

## 2021-07-04 DIAGNOSIS — N281 Cyst of kidney, acquired: Secondary | ICD-10-CM | POA: Diagnosis not present

## 2021-07-04 DIAGNOSIS — I5020 Unspecified systolic (congestive) heart failure: Secondary | ICD-10-CM | POA: Diagnosis not present

## 2021-07-04 DIAGNOSIS — M810 Age-related osteoporosis without current pathological fracture: Secondary | ICD-10-CM | POA: Diagnosis not present

## 2021-07-04 DIAGNOSIS — F32A Depression, unspecified: Secondary | ICD-10-CM | POA: Diagnosis not present

## 2021-07-04 DIAGNOSIS — K5909 Other constipation: Secondary | ICD-10-CM | POA: Diagnosis not present

## 2021-07-04 DIAGNOSIS — E7800 Pure hypercholesterolemia, unspecified: Secondary | ICD-10-CM | POA: Diagnosis not present

## 2021-07-04 DIAGNOSIS — I272 Pulmonary hypertension, unspecified: Secondary | ICD-10-CM | POA: Diagnosis not present

## 2021-07-04 DIAGNOSIS — D649 Anemia, unspecified: Secondary | ICD-10-CM | POA: Diagnosis not present

## 2021-07-04 DIAGNOSIS — M47812 Spondylosis without myelopathy or radiculopathy, cervical region: Secondary | ICD-10-CM | POA: Diagnosis not present

## 2021-07-04 DIAGNOSIS — M5030 Other cervical disc degeneration, unspecified cervical region: Secondary | ICD-10-CM | POA: Diagnosis not present

## 2021-07-04 DIAGNOSIS — K219 Gastro-esophageal reflux disease without esophagitis: Secondary | ICD-10-CM | POA: Diagnosis not present

## 2021-07-04 DIAGNOSIS — G4733 Obstructive sleep apnea (adult) (pediatric): Secondary | ICD-10-CM | POA: Diagnosis not present

## 2021-07-04 DIAGNOSIS — N200 Calculus of kidney: Secondary | ICD-10-CM | POA: Diagnosis not present

## 2021-07-04 DIAGNOSIS — E039 Hypothyroidism, unspecified: Secondary | ICD-10-CM | POA: Diagnosis not present

## 2021-07-04 DIAGNOSIS — M47816 Spondylosis without myelopathy or radiculopathy, lumbar region: Secondary | ICD-10-CM | POA: Diagnosis not present

## 2021-07-04 DIAGNOSIS — I872 Venous insufficiency (chronic) (peripheral): Secondary | ICD-10-CM | POA: Diagnosis not present

## 2021-07-04 DIAGNOSIS — I890 Lymphedema, not elsewhere classified: Secondary | ICD-10-CM | POA: Diagnosis not present

## 2021-07-04 DIAGNOSIS — I110 Hypertensive heart disease with heart failure: Secondary | ICD-10-CM | POA: Diagnosis not present

## 2021-07-04 DIAGNOSIS — R490 Dysphonia: Secondary | ICD-10-CM | POA: Diagnosis not present

## 2021-07-04 DIAGNOSIS — M5441 Lumbago with sciatica, right side: Secondary | ICD-10-CM | POA: Diagnosis not present

## 2021-07-04 DIAGNOSIS — K760 Fatty (change of) liver, not elsewhere classified: Secondary | ICD-10-CM | POA: Diagnosis not present

## 2021-07-10 ENCOUNTER — Telehealth: Payer: Self-pay | Admitting: Internal Medicine

## 2021-07-10 DIAGNOSIS — G8929 Other chronic pain: Secondary | ICD-10-CM | POA: Diagnosis not present

## 2021-07-10 DIAGNOSIS — M19011 Primary osteoarthritis, right shoulder: Secondary | ICD-10-CM | POA: Diagnosis not present

## 2021-07-10 DIAGNOSIS — M19012 Primary osteoarthritis, left shoulder: Secondary | ICD-10-CM | POA: Diagnosis not present

## 2021-07-10 NOTE — Telephone Encounter (Signed)
error 

## 2021-07-12 DIAGNOSIS — I10 Essential (primary) hypertension: Secondary | ICD-10-CM | POA: Diagnosis not present

## 2021-07-12 DIAGNOSIS — M19011 Primary osteoarthritis, right shoulder: Secondary | ICD-10-CM | POA: Diagnosis not present

## 2021-07-12 DIAGNOSIS — Z515 Encounter for palliative care: Secondary | ICD-10-CM | POA: Diagnosis not present

## 2021-07-12 DIAGNOSIS — M170 Bilateral primary osteoarthritis of knee: Secondary | ICD-10-CM | POA: Diagnosis not present

## 2021-07-12 DIAGNOSIS — M19021 Primary osteoarthritis, right elbow: Secondary | ICD-10-CM | POA: Diagnosis not present

## 2021-07-12 DIAGNOSIS — M479 Spondylosis, unspecified: Secondary | ICD-10-CM | POA: Diagnosis not present

## 2021-07-16 ENCOUNTER — Other Ambulatory Visit: Payer: Self-pay | Admitting: Internal Medicine

## 2021-07-17 DIAGNOSIS — M19011 Primary osteoarthritis, right shoulder: Secondary | ICD-10-CM | POA: Diagnosis not present

## 2021-07-17 DIAGNOSIS — R339 Retention of urine, unspecified: Secondary | ICD-10-CM | POA: Diagnosis not present

## 2021-07-17 DIAGNOSIS — G8929 Other chronic pain: Secondary | ICD-10-CM | POA: Diagnosis not present

## 2021-07-17 DIAGNOSIS — M19012 Primary osteoarthritis, left shoulder: Secondary | ICD-10-CM | POA: Diagnosis not present

## 2021-07-19 ENCOUNTER — Ambulatory Visit (INDEPENDENT_AMBULATORY_CARE_PROVIDER_SITE_OTHER): Payer: PPO | Admitting: Internal Medicine

## 2021-07-19 ENCOUNTER — Other Ambulatory Visit: Payer: Self-pay

## 2021-07-19 DIAGNOSIS — M7989 Other specified soft tissue disorders: Secondary | ICD-10-CM | POA: Diagnosis not present

## 2021-07-19 DIAGNOSIS — E785 Hyperlipidemia, unspecified: Secondary | ICD-10-CM

## 2021-07-19 DIAGNOSIS — G629 Polyneuropathy, unspecified: Secondary | ICD-10-CM | POA: Diagnosis not present

## 2021-07-19 DIAGNOSIS — E033 Postinfectious hypothyroidism: Secondary | ICD-10-CM

## 2021-07-19 DIAGNOSIS — I700 Atherosclerosis of aorta: Secondary | ICD-10-CM

## 2021-07-19 DIAGNOSIS — I509 Heart failure, unspecified: Secondary | ICD-10-CM | POA: Diagnosis not present

## 2021-07-19 DIAGNOSIS — I272 Pulmonary hypertension, unspecified: Secondary | ICD-10-CM | POA: Diagnosis not present

## 2021-07-19 DIAGNOSIS — I10 Essential (primary) hypertension: Secondary | ICD-10-CM | POA: Diagnosis not present

## 2021-07-19 DIAGNOSIS — M19011 Primary osteoarthritis, right shoulder: Secondary | ICD-10-CM

## 2021-07-19 DIAGNOSIS — I872 Venous insufficiency (chronic) (peripheral): Secondary | ICD-10-CM

## 2021-07-19 DIAGNOSIS — R21 Rash and other nonspecific skin eruption: Secondary | ICD-10-CM

## 2021-07-19 DIAGNOSIS — F439 Reaction to severe stress, unspecified: Secondary | ICD-10-CM | POA: Diagnosis not present

## 2021-07-19 DIAGNOSIS — K219 Gastro-esophageal reflux disease without esophagitis: Secondary | ICD-10-CM | POA: Diagnosis not present

## 2021-07-19 NOTE — Progress Notes (Signed)
Patient ID: Kimberly Roy, female   DOB: [birth year], 87 y.o.   MRN: [id number]   Subjective:    Patient ID: Kimberly Roy, female    DOB: 1931-07-08, 87 y.o.   MRN: [id number]  This visit occurred during the SARS-CoV-2 public health emergency.  Safety protocols were in place, including screening questions prior to the visit, additional usage of staff PPE, and extensive cleaning of exam room while observing appropriate contact time as indicated for disinfecting solutions.   Patient here for a scheduled follow up.   Chief Complaint  Patient presents with   Follow-up   Rash   .   HPI Here to follow up regarding her blood pressure, cholesterol.  Also concerned regarding persistent rash.  Has erythematous lesions - knee.  Unclear etiology.  Rash on her back appears to be more c/w possible contact dermatitis.  Trail of steroid cream on this area.  Discussed referral to dermatology for persistent rash.  No headache.  No chest pain reported.  Breathing stable.  No abdominal pain.  Bowels stable.  Request refill - ketoconazole  - for her scalp.  She is having issues with chronic pain - both shoulders.  Seeing ortho.  Per pt, was informed by Vance Peper to increase gabapentin to 946m in am and 9069mmidday and 120039m hs.  Due a refill.  Discussed the need to f/u with JonVance Peperr refills given they are following her pain and adjusting dose.     Past Medical History:  Diagnosis Date   Anemia    Anxiety    Chest pain    CHF (congestive heart failure) (HCC)    Constipation    DDD (degenerative disc disease), cervical    Depression    DVT (deep venous thrombosis) (HCC)    Dysphonia    Dyspnea    Fatty liver    Fatty liver    Headache    Hyperlipidemia    Hyperpiesia    Hypertension    Hypothyroidism    Interstitial cystitis    Left ventricular dysfunction    Lymphedema    Nephrolithiasis    Obstructive sleep apnea    Osteoarthritis    knees/cervical and lumbar spine   Pulmonary hypertension  (HCC)    Pulmonary nodules    followed by Dr FleRaul DelPure hypercholesterolemia    Renal cyst    right   Past Surgical History:  Procedure Laterality Date   ABDOMINAL HYSTERECTOMY     ovaries left in place   APPENDECTOMY     Back Surgeries     BACK SURGERY     BREAST REDUCTION SURGERY     3/99   CARDIAC CATHETERIZATION     cataracts Bilateral    CERVICAL SPINE SURGERY     ESOPHAGEAL MANOMETRY N/A 08/02/2015   Procedure: ESOPHAGEAL MANOMETRY (EM);  Surgeon: MatJosefine ClassD;  [facility];  Service: Endoscopy;  Laterality: N/A;   ESOPHAGOGASTRODUODENOSCOPY N/A 02/27/2015   Procedure: ESOPHAGOGASTRODUODENOSCOPY (EGD);  Surgeon: PauHulen LusterD;  [facility];  Service: Gastroenterology;  Laterality: N/A;   ESOPHAGOGASTRODUODENOSCOPY (EGD) WITH PROPOFOL N/A 10/30/2018   Procedure: ESOPHAGOGASTRODUODENOSCOPY (EGD) WITH PROPOFOL;  Surgeon: SkuLollie SailsD;  [facility];  Service: Endoscopy;  Laterality: N/A;   EXCISIONAL HEMORRHOIDECTOMY     EYE SURGERY     FRACTURE SURGERY     HEMORRHOID SURGERY     HIP SURGERY  2013   Right hip surgery  JOINT REPLACEMENT     KNEE ARTHROSCOPY     left and right   ORIF FEMUR FRACTURE Right 08/07/2018   Procedure: OPEN REDUCTION INTERNAL FIXATION (ORIF) DISTAL FEMUR FRACTURE;  Surgeon: Hessie Knows, MD;  Location: [facility] ORS;  Service: Orthopedics;  Laterality: Right;   REDUCTION MAMMAPLASTY Bilateral YRS AGO   REPLACEMENT TOTAL KNEE Bilateral    rotator cuff surgery     blilateral   TONSILECTOMY/ADENOIDECTOMY WITH MYRINGOTOMY     VISCERAL ARTERY INTERVENTION N/A 02/25/2019   Procedure: VISCERAL ARTERY INTERVENTION;  Surgeon: Algernon Huxley, MD;  Location: [place] [facility];  Service: Cardiovascular;  Laterality: N/A;   Family History  Problem Relation Age of Onset   Heart disease Mother    Stroke Mother    Hypertension Mother    Heart disease Father        myocardial infarction age 51    Breast cancer Neg Hx    Social History   Socioeconomic History   Marital status: Widowed    Spouse name: Not on file   Number of children: 2   Years of education: 33   Highest education level: 12th grade  Occupational History    Comment: insurance agency  Tobacco Use   Smoking status: Never   Smokeless tobacco: Never  Vaping Use   Vaping Use: Never used  Substance and Sexual Activity   Alcohol use: No    Alcohol/week: 0.0 standard drinks   Drug use: Never   Sexual activity: Not Currently  Other Topics Concern   Not on file  Social History Narrative   Not on file   Social Determinants of Health   Financial Resource Strain: Low Risk    Difficulty of Paying Living Expenses: Not hard at all  Food Insecurity: No Food Insecurity   Worried About Charity fundraiser in the Last Year: Never true   [employer] in the Last Year: Never true  Transportation Needs: No Transportation Needs   Lack of Transportation (Medical): No   Lack of Transportation (Non-Medical): No  Physical Activity: Inactive   Days of Exercise per Week: 0 days   Minutes of Exercise per Session: 0 min  Stress: No Stress Concern Present   Feeling of Stress : Only a little  Social Connections: Socially Isolated   Frequency of Communication with Friends and Family: Three times a week   Frequency of Social Gatherings with Friends and Family: Twice a week   Attends Religious Services: Never   Marine scientist or Organizations: No   Attends Archivist Meetings: Never   Marital Status: Widowed     Review of Systems  Constitutional:  Negative for appetite change and unexpected weight change.  HENT:  Negative for congestion and sinus pressure.   Respiratory:  Negative for cough, chest tightness and shortness of breath.   Cardiovascular:  Negative for chest pain and palpitations.  Gastrointestinal:  Negative for abdominal pain, diarrhea, nausea and vomiting.  Genitourinary:  Negative for  difficulty urinating and dysuria.  Musculoskeletal:  Negative for myalgias.       Shoulder pain as outlined.    Skin:  Positive for rash. Negative for color change.  Neurological:  Negative for dizziness, light-headedness and headaches.  Psychiatric/Behavioral:  Negative for agitation and dysphoric mood.       Objective:     BP 138/74   Pulse 69   Ht 5' 2.01" (1.575 m)   Wt 176 lb 9.6 oz (80.1 kg)  SpO2 95%   BMI 32.29 kg/m  Wt Readings from Last 3 Encounters:  07/19/21 176 lb 9.6 oz (80.1 kg)  06/27/21 174 lb 11.2 oz (79.2 kg)  05/14/21 174 lb (78.9 kg)    Physical Exam Vitals reviewed.  Constitutional:      General: She is not in acute distress.    Appearance: Normal appearance.  HENT:     Head: Normocephalic and atraumatic.     Right Ear: External ear normal.     Left Ear: External ear normal.  Eyes:     General: No scleral icterus.       Right eye: No discharge.        Left eye: No discharge.     Conjunctiva/sclera: Conjunctivae normal.  Neck:     Thyroid: No thyromegaly.  Cardiovascular:     Rate and Rhythm: Normal rate and regular rhythm.  Pulmonary:     Effort: No respiratory distress.     Breath sounds: Normal breath sounds. No wheezing.  Abdominal:     General: Bowel sounds are normal.     Palpations: Abdomen is soft.     Tenderness: There is no abdominal tenderness.  Musculoskeletal:        General: No swelling or tenderness.     Cervical back: Neck supple. No tenderness.  Lymphadenopathy:     Cervical: No cervical adenopathy.  Skin:    Comments: Stasis changes - lower extremities. Rash - knee    Neurological:     Mental Status: She is alert.  Psychiatric:        Mood and Affect: Mood normal.        Behavior: Behavior normal.     Outpatient Encounter Medications as of 07/19/2021  Medication Sig   albuterol (VENTOLIN HFA) 108 (90 Base) MCG/ACT inhaler INHALE 2 PUFFS INTO THE LUNGS EVERY 6 (SIX) HOURS AS NEEDED FOR WHEEZING OR SHORTNESS OF  BREATH   aluminum-magnesium hydroxide-simethicone (MAALOX) 200-200-20 MG/5ML SUSP Take 15 mLs by mouth 4 (four) times daily -  before meals and at bedtime.    amLODipine (NORVASC) 10 MG tablet TAKE 1 TABLET BY MOUTH DAILY   aspirin 81 MG EC tablet Take by mouth.   atorvastatin (LIPITOR) 20 MG tablet Take 1 tablet (20 mg total) by mouth at bedtime.   Azelastine HCl 137 MCG/SPRAY SOLN PLACE 1 SPRAY INTO BOTH NOSTRILS TWO TIMES DAILY   benazepril (LOTENSIN) 40 MG tablet TAKE 1 TABLET BY MOUTH DAILY   betamethasone dipropionate 0.05 % cream Apply 1 application topically 2 (two) times daily.   budesonide-formoterol (SYMBICORT) 80-4.5 MCG/ACT inhaler Inhale 2 puffs into the lungs 2 (two) times daily.   busPIRone (BUSPAR) 10 MG tablet Take 10 mg by mouth 2 (two) times daily.    calcium citrate-vitamin D (CITRACAL+D) 315-200 MG-UNIT tablet Take 1 tablet by mouth daily.   cephALEXin (KEFLEX) 250 MG capsule Take 250 mg by mouth daily.   Cholecalciferol (VITAMIN D3) 1000 UNITS CAPS Take 1 capsule by mouth daily.   Diclofenac Sodium 3 % GEL Apply topically 2 (two) times daily.   Docusate Sodium (DSS) 100 MG CAPS Take by mouth.   DULoxetine (CYMBALTA) 60 MG capsule TAKE 1 CAPSULE BY MOUTH EVERY DAY.   ELMIRON 100 MG capsule Take 100 mg by mouth 3 (three) times daily.   fluocinonide (LIDEX) 0.05 % external solution Apply topically.   furosemide (LASIX) 20 MG tablet TAKE 1 TABLET BY MOUTH DAILY AS NEEDED   gabapentin (NEURONTIN) 300 MG capsule TAKE  2 CAPSULES BY MOUTH 3 TIMES A DAY.   HYDROcodone-acetaminophen (NORCO) 10-325 MG tablet Take 1 tablet by mouth 3 (three) times daily.   ketoconazole (NIZORAL) 2 % shampoo Apply 1 application topically 2 (two) times a week.   magnesium oxide (MAG-OX) 400 MG tablet Take 1 tablet (400 mg total) by mouth daily.   Melatonin 10 MG CAPS Take 1 capsule by mouth.   methocarbamol (ROBAXIN) 500 MG tablet Take 500 mg by mouth 3 (three) times daily. As needed   metoprolol  succinate (TOPROL-XL) 50 MG 24 hr tablet Take 1 tablet (50 mg total) by mouth daily. Take with or immediately following a meal.   montelukast (SINGULAIR) 10 MG tablet 10 mg once daily as needed   morphine (MS CONTIN) 60 MG 12 hr tablet Take 1 tablet (60 mg total) by mouth 2 (two) times daily.   mupirocin ointment (BACTROBAN) 2 % Apply 1 application topically 2 (two) times daily.   Naldemedine Tosylate 0.2 MG TABS Take 0.2 mg by mouth daily.   NYAMYC powder Apply topically.   polyethylene glycol powder (GLYCOLAX/MIRALAX) 17 GM/SCOOP powder MIX 17 GRAMS AS MARKED ON BOTTLE TOP IN 8 OUNCES OF WATER AND DRINK ONCE A DAY AS DIRECTED.   RABEprazole (ACIPHEX) 20 MG tablet TAKE 1 TABLET BY MOUTH TWICE A DAY BEFORE A MEAL   senna (SENOKOT) 8.6 MG TABS tablet Take 1 tablet by mouth 2 (two) times daily.   Simethicone (GAS-X PO) Take 2 tablets by mouth 2 (two) times daily.   sodium chloride (OCEAN) 0.65 % nasal spray Place 1 spray into the nose as needed.   SYNTHROID 100 MCG tablet Take 1 tablet (100 mcg total) by mouth daily.   triamcinolone ointment (KENALOG) 0.1 % APPLY TWICE DAILY TO BITES AND RASH UNTIL FLAT AND SMOOTH **DO NOT APPLY TO FACE**   [DISCONTINUED] traZODone (DESYREL) 50 MG tablet Take 25-50 mg by mouth at bedtime as needed.   [DISCONTINUED] hydrOXYzine (ATARAX/VISTARIL) 25 MG tablet Take 25 mg by mouth daily. (Patient not taking: Reported on 07/19/2021)   No facility-administered encounter medications on file as of 07/19/2021.     Lab Results  Component Value Date   WBC 6.7 06/27/2021   HGB 10.4 (L) 06/27/2021   HCT 32.2 (L) 06/27/2021   PLT 155 06/27/2021   GLUCOSE 76 06/27/2021   CHOL 128 05/14/2021   TRIG 123.0 05/14/2021   HDL 44.30 05/14/2021   LDLCALC 60 05/14/2021   ALT 15 06/27/2021   AST 20 06/27/2021   NA 137 06/27/2021   K 4.4 06/27/2021   CL 98 06/27/2021   CREATININE 0.93 06/27/2021   BUN 24 (H) 06/27/2021   CO2 33 (H) 06/27/2021   TSH 0.45 05/14/2021   INR  0.89 08/06/2018   HGBA1C 6.0 10/19/2014    CT ABDOMEN PELVIS W CONTRAST  Result Date: 06/18/2021 CLINICAL DATA:  Abdominal pain for several months EXAM: CT ABDOMEN AND PELVIS WITH CONTRAST TECHNIQUE: Multidetector CT imaging of the abdomen and pelvis was performed using the standard protocol following bolus administration of intravenous contrast. CONTRAST:  63m OMNIPAQUE IOHEXOL 350 MG/ML SOLN COMPARISON:  CT Abdomen and Pelvis 11/16/2020. FINDINGS: Lower chest: Unchanged nodules in the included lung bases including a 6 mm left lower lobe nodule which is longstanding and considered benign. No pleural effusion. Mild cardiomegaly. Hepatobiliary: No focal liver abnormality is seen. Mild chronic intrahepatic biliary prominence is unchanged. There is no significant extrahepatic biliary dilatation, and the gallbladder is unremarkable. Pancreas: Unremarkable. Spleen: Unremarkable.  Adrenals/Urinary Tract: Unchanged 1.5 cm right adrenal myelolipoma. Unremarkable left adrenal gland. Similar appearance of right renal cysts measuring up to 5.2 cm. Subcentimeter hypodensities in both kidneys, too small to fully characterize. Mild bilateral renal cortical thinning. No renal calculi or hydronephrosis. Unremarkable bladder. Stomach/Bowel: The stomach is unremarkable. There is no evidence of bowel obstruction or inflammation. Prior appendectomy. Vascular/Lymphatic: Abdominal aortic atherosclerosis without aneurysm. No enlarged lymph nodes. Reproductive: Status post hysterectomy. No adnexal masses. Other: Unchanged small fat containing umbilical hernia. No ascites or pneumoperitoneum. Musculoskeletal: ORIF of the right femur. Prior posterior decompression and fusion in the lumbar spine. Partially visualized spinal stimulator extending towards the thoracic spine. Diffuse osteopenia. IMPRESSION: 1. No acute abnormality identified in the abdomen or pelvis. 2. Unchanged right adrenal myelolipoma. 3. Aortic Atherosclerosis  (ICD10-I70.0). Electronically Signed   By: Logan Bores M.D.   On: 06/18/2021 11:39       Assessment & Plan:   Problem List Items Addressed This Visit     Aortic atherosclerosis ([place])    Continue lipitor.       CHF (congestive heart failure) (HCC)    On lasix.  Lower extremity swelling improved.  No evidence of volume overload.  Follow.       Chronic venous insufficiency    Continue compression hose.       GERD (gastroesophageal reflux disease)    Continue aciphex.  Followed by GI.       Hyperlipidemia    On lipitor.  Low cholesterol diet and exercise.  Follow lipid panel and liver function tests.        Hypertension    Continue amlodipine, lasix and metoprolol. Blood pressure as outlined.  Follow pressures.  Follow metabolic panel.       Hypothyroidism    On thyroid replacement.  Follow tsh.       Leg swelling    Improved with compression hose.       Neuropathy    Has neuropathy.  Also increased shoulder pain, etc.  Seeing ortho.  They just recently increased gabapentin as outlined.  Given they are following her and adjusting dose, refill gabapentin through ortho.  Called ortho and notified.        Primary osteoarthritis of right shoulder    Followed by ortho      Pulmonary hypertension ([place])    Followed by pulmonary.  Continue nighttime oxygen.       Rash    Rash left knee.  Not responding to cream.  Refer to dermatology.        Relevant Orders   Ambulatory referral to Dermatology   Stress    Followed by psychiatry.  Appears to be stable.  Follow.         Einar Pheasant, MD

## 2021-07-20 ENCOUNTER — Telehealth: Payer: Self-pay | Admitting: Internal Medicine

## 2021-07-20 NOTE — Telephone Encounter (Signed)
Spoken ti patient, she stated she has enough gabapentin until maybe next Friday. Due to Her provider being out of town until [place], and the [facility] at [facility] wouldn't refill her dosage. She would like to see if Dr Nicki Reaper can give an rx enough until she is able to ask Dr Rogers Blocker. Patient stated 100 pills should work.

## 2021-07-20 NOTE — Telephone Encounter (Signed)
Pt was seen on 11/17 at 12pm. Pt explained she was going to have Marylee Floras fill her prescription for gabapentin (NEURONTIN) 300 MG capsule for her. Pt states Jenny Reichmann is away on vacation and the amount of pills (100 capsules)  she is needing a PA cannot write her prescription for her. Pt uses the [facility].

## 2021-07-21 NOTE — Telephone Encounter (Signed)
Called and spoke to Ms Greening.  Has gabapentin to last until end of next week.  Will need to clarify with ortho regarding prescription.  [facility] to [facility] for refill.  Vance Peper is a PA at [facility].  I will follow up to see if covering attending can refill until his return.  Please hold message until can clarify.

## 2021-07-22 ENCOUNTER — Encounter: Payer: Self-pay | Admitting: Internal Medicine

## 2021-07-23 ENCOUNTER — Encounter: Payer: Self-pay | Admitting: Internal Medicine

## 2021-07-23 DIAGNOSIS — M19011 Primary osteoarthritis, right shoulder: Secondary | ICD-10-CM | POA: Diagnosis not present

## 2021-07-23 DIAGNOSIS — M25512 Pain in left shoulder: Secondary | ICD-10-CM | POA: Diagnosis not present

## 2021-07-23 DIAGNOSIS — G8929 Other chronic pain: Secondary | ICD-10-CM | POA: Diagnosis not present

## 2021-07-23 DIAGNOSIS — M19012 Primary osteoarthritis, left shoulder: Secondary | ICD-10-CM | POA: Diagnosis not present

## 2021-07-23 NOTE — Assessment & Plan Note (Signed)
On lipitor.  Low cholesterol diet and exercise.  Follow lipid panel and liver function tests.   

## 2021-07-23 NOTE — Assessment & Plan Note (Signed)
Has neuropathy.  Also increased shoulder pain, etc.  Seeing ortho.  They just recently increased gabapentin as outlined.  Given they are following her and adjusting dose, refill gabapentin through ortho.  Called ortho and notified.

## 2021-07-23 NOTE — Assessment & Plan Note (Signed)
On thyroid replacement.  Follow tsh.  

## 2021-07-23 NOTE — Assessment & Plan Note (Signed)
Continue amlodipine, lasix and metoprolol. Blood pressure as outlined.  Follow pressures.  Follow metabolic panel.  °

## 2021-07-23 NOTE — Telephone Encounter (Signed)
Patient called about MyChart message.

## 2021-07-23 NOTE — Assessment & Plan Note (Signed)
Continue lipitor.

## 2021-07-23 NOTE — Telephone Encounter (Signed)
I have a phone message about this as well to hold until we clarify. See me about this and I can call Jefm Bryant if needed.

## 2021-07-23 NOTE — Assessment & Plan Note (Signed)
Followed by ortho

## 2021-07-23 NOTE — Assessment & Plan Note (Signed)
Continue compression hose.

## 2021-07-23 NOTE — Assessment & Plan Note (Signed)
Followed by psychiatry.  Appears to be stable.  Follow.  °

## 2021-07-23 NOTE — Assessment & Plan Note (Signed)
Improved with compression hose.   

## 2021-07-23 NOTE — Assessment & Plan Note (Signed)
Continue aciphex.  Followed by GI.

## 2021-07-23 NOTE — Assessment & Plan Note (Signed)
Followed by pulmonary.  Continue nighttime oxygen.

## 2021-07-23 NOTE — Telephone Encounter (Signed)
Holding see my chart

## 2021-07-23 NOTE — Assessment & Plan Note (Signed)
Rash left knee.  Not responding to cream.  Refer to dermatology.

## 2021-07-23 NOTE — Telephone Encounter (Signed)
Please notify Kimberly Roy that I have talked to ortho and they prefer for Kimberly Roy to refill the medication when he returns (instead of a provider that is not familiar with her).  I need to clarify exactly when she started on the current dose she is taking now (and clarify the dose - 914m in am, 9050mmidday and 1200105mt night.   (Need to know how long she has been taking this dose).  Kimberly Roy return next Wednesday.  I will refill an adjusted dose of gabapentin (lower than what she is currently taking) enough to cover until Kimberly Glaserturns, but need the information above to determine dose I will refill and number of pills.

## 2021-07-23 NOTE — Assessment & Plan Note (Signed)
On lasix.  Lower extremity swelling improved.  No evidence of volume overload.  Follow.

## 2021-07-24 MED ORDER — GABAPENTIN 300 MG PO CAPS: ORAL_CAPSULE | ORAL | 0 refills | Status: DC

## 2021-07-24 NOTE — Telephone Encounter (Signed)
Confirmed with patient that Kimberly Roy changed her dose at her last visit on 8/30. Advised patient of message below. Confirmed dose of 900 mg in the morning, 900 mg mid day, 1200 mg qhs. Needs rx sent to [facility].

## 2021-07-24 NOTE — Telephone Encounter (Signed)
LMTCB

## 2021-07-24 NOTE — Telephone Encounter (Signed)
Discussed with pt.  Discussed will decrease dose slightly.  She had been on gabapentin 388m two tid.  Is now taking 908min am, 90036midday and 1200m57mhs.  Discussed will decrease to 600mg53mam, 600mg 72may and 900mg q65m  Monitor for any dizziness, light headedness, etc.  Any problems will call.

## 2021-07-24 NOTE — Telephone Encounter (Signed)
See mychart.  

## 2021-07-24 NOTE — Telephone Encounter (Signed)
Patient returned Trisha's phone call about medication

## 2021-07-24 NOTE — Telephone Encounter (Signed)
Rx sent in to [facility].

## 2021-07-30 ENCOUNTER — Other Ambulatory Visit: Payer: Self-pay | Admitting: Dermatology

## 2021-07-30 ENCOUNTER — Other Ambulatory Visit: Payer: Self-pay

## 2021-07-30 ENCOUNTER — Ambulatory Visit: Payer: PPO | Admitting: Dermatology

## 2021-07-30 DIAGNOSIS — L219 Seborrheic dermatitis, unspecified: Secondary | ICD-10-CM

## 2021-07-30 DIAGNOSIS — L853 Xerosis cutis: Secondary | ICD-10-CM

## 2021-07-30 DIAGNOSIS — R21 Rash and other nonspecific skin eruption: Secondary | ICD-10-CM

## 2021-07-30 DIAGNOSIS — L308 Other specified dermatitis: Secondary | ICD-10-CM | POA: Diagnosis not present

## 2021-07-30 DIAGNOSIS — I872 Venous insufficiency (chronic) (peripheral): Secondary | ICD-10-CM

## 2021-07-30 MED ORDER — FLUOCINONIDE 0.05 % EX SOLN: Freq: Two times a day (BID) | CUTANEOUS | 3 refills | Status: DC

## 2021-07-30 MED ORDER — MUPIROCIN 2 % EX OINT
1.0000 | TOPICAL_OINTMENT | Freq: Every day | CUTANEOUS | 0 refills | Status: DC
Start: 2021-07-30 — End: 2022-07-12

## 2021-07-30 MED ORDER — KETOCONAZOLE 2 % EX SHAM: 1.0000 "application " | MEDICATED_SHAMPOO | CUTANEOUS | 3 refills | Status: DC

## 2021-07-30 MED ORDER — CLOBETASOL PROPIONATE 0.05 % EX CREA: 1.0000 "application " | TOPICAL_CREAM | CUTANEOUS | 1 refills | Status: DC

## 2021-07-30 NOTE — Progress Notes (Signed)
New Patient Visit  Subjective  Kimberly Roy is a 87 y.o. female who presents for the following: New Patient (Initial Visit) (Patient reports rash several places over body that started 10 days ago. She reports rash at knees, left shoulder, feet and back. Patient has been using triamcinolone and cortisone. Patient reports that it does help. Patient has new place at right foot and was painful, now itchy. Patient denies starting any new meds, detergents, or any new products. ). No pets or exposure to animals.  Bumps come up in random places and are very itchy and take a few days to improve. Pt has seborrheic dermatitis and needs rfs of medication which have been helping control her itchy scalp condition.   The following portions of the chart were reviewed this encounter and updated as appropriate:       Review of Systems:  No other skin or systemic complaints except as noted in HPI or Assessment and Plan.  Objective  Well appearing patient in no apparent distress; mood and affect are within normal limits.  A focused examination was performed including back, bilateral knees, bilateral legs, bilateral feet, arms, scalp, behind ears. Relevant physical exam findings are noted in the Assessment and Plan.  Crusted excoriations and pink papules at left knee, pink edematous papules on bilateral foot dorsum      Left knee inferior Pink edematous papule   left knee inferior Pink edematous papule  legs, arms, back Xerosis on body   Scalp Scalp clear today  bilateral lower legs With pitted edema and erythema at lower legs    Assessment & Plan  Rash and other nonspecific skin eruption left knee inferior; Left knee inferior  Punch biopsy for H&E and DIF  Bite reaction r/o  urticarial bullous pemphigoid  Start clobetasol cream bid to aas body prn itchy rash, avoid f/g/a Start mupirocin ointment to biopsy sites qd/bid and cover  Skin / nail biopsy - Left knee inferior Type of biopsy:  punch   Informed consent: discussed and consent obtained   Patient was prepped and draped in usual sterile fashion: Area prepped with alcohol. Anesthesia: the lesion was anesthetized in a standard fashion   Anesthetic:  1% lidocaine w/ epinephrine 1-100,000 buffered w/ 8.4% NaHCO3 Punch size:  3.5 mm Suture size:  4-0 Suture type: nylon   Hemostasis achieved with: suture, pressure and Gelfoam   Outcome: patient tolerated procedure well   Post-procedure details: wound care instructions given   Post-procedure details comment:  Ointment and small bandage applied.  Additional details:  For H&E  Skin / nail biopsy - left knee inferior Type of biopsy: punch   Informed consent: discussed and consent obtained   Patient was prepped and draped in usual sterile fashion: Area prepped with alcohol. Anesthesia: the lesion was anesthetized in a standard fashion   Anesthetic:  1% lidocaine w/ epinephrine 1-100,000 buffered w/ 8.4% NaHCO3 Punch size:  3.5 mm Suture size:  4-0 Suture type: nylon   Hemostasis achieved with: suture, pressure and Gelfoam   Outcome: patient tolerated procedure well   Post-procedure details: wound care instructions given   Post-procedure details comment:  Ointment and small bandage applied.  Additional details:  For DIF  clobetasol cream (TEMOVATE) 0.05 % - Left knee inferior, left knee inferior Apply 1 application topically See admin instructions. Apply to affected areas of body 1 - 2 times daily as needed for itchy bumps. Avoid applying to face, groin, and axilla. Use as directed.  mupirocin ointment (  BACTROBAN) 2 % - Left knee inferior, left knee inferior Apply 1 application topically daily. Apply to left knee until healed  Specimen 1 - Surgical pathology Differential Diagnosis: Bite reaction r/o  urticarial bullous pemphigoid   Check Margins: No  Specimen 2 - Surgical pathology Differential Diagnosis: Bite reaction r/o  urticarial  bullous pemphigoid  Check  Margins: No  Xerosis cutis legs, arms, back  Recommend mild soap and moisturizing cream 1-2 times daily.  Gentle skin care handout provided.    Seborrheic dermatitis Scalp  Improved on medication, needs rfs  Seborrheic Dermatitis  -  is a chronic persistent rash characterized by pinkness and scaling most commonly of the mid face but also can occur on the scalp (dandruff), ears; mid chest, mid back and groin.  It tends to be exacerbated by stress and cooler weather.  People who have neurologic disease may experience new onset or exacerbation of existing seborrheic dermatitis.  The condition is not curable but treatable and can be controlled.  Restart Nizoral shampoo 2% apply three times per week, massage into scalp and leave in for 10 minutes before rinsing out  restart Lidex 0.05 % external solution - apply drops topically to affected areas of scalp 2 times daily for itch.     ketoconazole (NIZORAL) 2 % shampoo - Scalp Apply 1 application topically See admin instructions. apply three times per week, massage into scalp and leave in for 10 minutes before rinsing out  fluocinonide (LIDEX) 0.05 % external solution - Scalp Apply topically 2 (two) times daily. Apply drops to scalp for itch  Stasis dermatitis of both legs bilateral lower legs  Stasis in the legs causes chronic leg swelling, which may result in itchy or painful rashes, skin discoloration, skin texture changes, and sometimes ulceration.  Recommend daily compression hose/stockings- easiest to put on first thing in morning, remove at bedtime.  Elevate legs as much as possible. Avoid salt/sodium rich foods.  Return for 1 week suture removal appointment and follow up with Dr. Sharman Crate, Ruthell Rummage, CMA, am acting as scribe for Brendolyn Patty, MD.  Documentation: I have reviewed the above documentation for accuracy and completeness, and I agree with the above.  Brendolyn Patty MD

## 2021-07-30 NOTE — Patient Instructions (Addendum)
Topical steroids (such as triamcinolone, fluocinolone, fluocinonide, mometasone, clobetasol, halobetasol, betamethasone, hydrocortisone) can cause thinning and lightening of the skin if they are used for too long in the same area. Your physician has selected the right strength medicine for your problem and area affected on the body. Please use your medication only as directed by your physician to prevent side effects.   Gentle Skin Care Guide  1. Bathe no more than once a day.  2. Avoid bathing in hot water  3. Use a mild soap like Dove, Vanicream, Cetaphil, CeraVe. Can use Lever 2000 or Cetaphil antibacterial soap  4. Use soap only where you need it. On most days, use it under your arms, between your legs, and on your feet. Let the water rinse other areas unless visibly dirty.  5. When you get out of the bath/shower, use a towel to gently blot your skin dry, don't rub it.  6. While your skin is still a little damp, apply a moisturizing cream such as Vanicream, CeraVe, Cetaphil, Eucerin, Sarna lotion or plain Vaseline Jelly. For hands apply Neutrogena [place] Hand Cream or Excipial Hand Cream.  7. Reapply moisturizer any time you start to itch or feel dry.  8. Sometimes using free and clear laundry detergents can be helpful. Fabric softener sheets should be avoided. Downy Free & Gentle liquid, or any liquid fabric softener that is free of dyes and perfumes, it acceptable to use  9. If your doctor has given you prescription creams you may apply moisturizers over them     Biopsy Wound Care Instructions  Leave the original bandage on for 24 hours if possible.  If the bandage becomes soaked or soiled before that time, it is OK to remove it and examine the wound.  A small amount of post-operative bleeding is normal.  If excessive bleeding occurs, remove the bandage, place gauze over the site and apply continuous pressure (no peeking) over the area for 30 minutes. If this does not work, please  call our clinic as soon as possible or page your doctor if it is after hours.   Once a day, cleanse the wound with soap and water. It is fine to shower. If a thick crust develops you may use a Q-tip dipped into dilute hydrogen peroxide (mix 1:1 with water) to dissolve it.  Hydrogen peroxide can slow the healing process, so use it only as needed.    After washing, apply petroleum jelly (Vaseline) or an antibiotic ointment if your doctor prescribed one for you, followed by a bandage.    For best healing, the wound should be covered with a layer of ointment at all times. If you are not able to keep the area covered with a bandage to hold the ointment in place, this may mean re-applying the ointment several times a day.  Continue this wound care until the wound has healed and is no longer open.   Itching and mild discomfort is normal during the healing process. However, if you develop pain or severe itching, please call our office.   If you have any discomfort, you can take Tylenol (acetaminophen) or ibuprofen as directed on the bottle. (Please do not take these if you have an allergy to them or cannot take them for another reason).  Some redness, tenderness and white or yellow material in the wound is normal healing.  If the area becomes very sore and red, or develops a thick yellow-green material (pus), it may be infected; please notify us.  If you have stitches, return to clinic as directed to have the stitches removed. You will continue wound care for 2-3 days after the stitches are removed.   Wound healing continues for up to one year following surgery. It is not unusual to experience pain in the scar from time to time during the interval.  If the pain becomes severe or the scar thickens, you should notify the office.    A slight amount of redness in a scar is expected for the first six months.  After six months, the redness will fade and the scar will soften and fade.  The color difference  becomes less noticeable with time.  If there are any problems, return for a post-op surgery check at your earliest convenience.  To improve the appearance of the scar, you can use silicone scar gel, cream, or sheets (such as Mederma or Serica) every night for up to one year. These are available over the counter (without a prescription).  Please call our office at [phone] for any questions or concerns.        If You Need Anything After Your Visit  If you have any questions or concerns for your doctor, please call our main line at [phone] and press option 4 to reach your doctor's medical assistant. If no one answers, please leave a voicemail as directed and we will return your call as soon as possible. Messages left after 4 pm will be answered the following business day.   You may also send [place] a message via [place]. We typically respond to MyChart messages within 1-2 business days.  For prescription refills, please ask your pharmacy to contact our office. Our fax number is [phone].  If you have an urgent issue when the clinic is closed that cannot wait until the next business day, you can page your doctor at the number below.    Please note that while we do our best to be available for urgent issues outside of office hours, we are not available 24/7.   If you have an urgent issue and are unable to reach [place], you may choose to seek medical care at your doctor's office, retail clinic, urgent care center, or emergency room.  If you have a medical emergency, please immediately call 911 or go to the emergency department.  Pager Numbers  - Dr. Nehemiah Massed: [phone]  - Dr. Laurence Ferrari: [phone]  - Dr. Nicole Kindred: [phone]  In the event of inclement weather, please call our main line at [phone] for an update on the status of any delays or closures.  Dermatology Medication Tips: Please keep the boxes that topical medications come in in order to help keep track of the  instructions about where and how to use these. Pharmacies typically print the medication instructions only on the boxes and not directly on the medication tubes.   If your medication is too expensive, please contact our office at [phone] option 4 or send [place] a message through [place].   We are unable to tell what your co-pay for medications will be in advance as this is different depending on your insurance coverage. However, we may be able to find a substitute medication at lower cost or fill out paperwork to get insurance to cover a needed medication.   If a prior authorization is required to get your medication covered by your insurance company, please allow [place] 1-2 business days to complete this process.  Drug prices often vary depending on where the prescription is filled and  some pharmacies may offer cheaper prices.  The website www.goodrx.com contains coupons for medications through different pharmacies. The prices here do not account for what the cost may be with help from insurance (it may be cheaper with your insurance), but the website can give you the price if you did not use any insurance.  - You can print the associated coupon and take it with your prescription to the pharmacy.  - You may also stop by our office during regular business hours and pick up a GoodRx coupon card.  - If you need your prescription sent electronically to a different pharmacy, notify our office through [facility] or by phone at [phone] option 4.     Si Usted Necesita Algo Despus de Su Visita  Tambin puede enviarnos un mensaje a travs de Pharmacist, community. Por lo general respondemos a los mensajes de MyChart en el transcurso de 1 a 2 das hbiles.  Para renovar recetas, por favor pida a su farmacia que se ponga en contacto con nuestra oficina. Harland Dingwall de fax es [place] [phone].  Si tiene un asunto urgente cuando la clnica est cerrada y que no puede esperar hasta el siguiente da hbil,  puede llamar/localizar a su doctor(a) al nmero que aparece a continuacin.   Por favor, tenga en cuenta que aunque hacemos todo lo posible para estar disponibles para asuntos urgentes fuera del horario de [place], no estamos disponibles las 24 horas del da, los 7 das de la [place].   Si tiene un problema urgente y no puede comunicarse con nosotros, puede optar por buscar atencin mdica  en el consultorio de su doctor(a), en una clnica privada, en un centro de atencin urgente o en una sala de emergencias.  Si tiene Engineering geologist, por favor llame inmediatamente al 911 o vaya a la sala de emergencias.  Nmeros de bper  - Dr. Nehemiah Massed: [phone]  - Dra. Moye: [phone]  - Dra. Nicole Kindred: [phone]  En caso de inclemencias del [place], por favor llame a Johnsie Kindred principal al [phone] para una actualizacin sobre el [place] de cualquier retraso o cierre.  Consejos para la medicacin en dermatologa: Por favor, guarde las cajas en las que vienen los medicamentos de uso tpico para ayudarle a seguir las instrucciones sobre dnde y cmo usarlos. Las farmacias generalmente imprimen las instrucciones del medicamento slo en las cajas y no directamente en los tubos del [place].   Si su medicamento es muy caro, por favor, pngase en contacto con Zigmund Daniel llamando al [phone] y presione la opcin 4 o envenos un mensaje a travs de Pharmacist, community.   No podemos decirle cul ser su copago por los medicamentos por adelantado ya que esto es diferente dependiendo de la cobertura de su seguro. Sin embargo, es posible que podamos encontrar un medicamento sustituto a Electrical engineer un formulario para que el seguro cubra el medicamento que se considera necesario.   Si se requiere una autorizacin previa para que su compaa de seguros [place] su medicamento, por favor permtanos de 1 a 2 das hbiles para completar este proceso.  Los precios de los medicamentos varan con  frecuencia dependiendo del Environmental consultant de dnde se surte la receta y alguna farmacias pueden ofrecer precios ms baratos.  El sitio web www.goodrx.com tiene cupones para medicamentos de Airline pilot. Los precios aqu no tienen en cuenta lo que podra costar con la ayuda del seguro (puede ser ms barato con su seguro), pero el sitio web puede darle el precio  si no utiliz [employer].  - Puede imprimir el cupn correspondiente y llevarlo con su receta a la farmacia.  - Tambin puede pasar por nuestra oficina durante el horario de atencin regular y Charity fundraiser una tarjeta de cupones de GoodRx.  - Si necesita que su receta se enve electrnicamente a una farmacia diferente, informe a nuestra oficina a travs de MyChart de [place] o por telfono llamando al [phone] y presione la opcin 4.

## 2021-08-02 ENCOUNTER — Encounter: Payer: Self-pay | Admitting: Internal Medicine

## 2021-08-02 MED ORDER — GABAPENTIN 300 MG PO CAPS
ORAL_CAPSULE | ORAL | 2 refills | Status: DC
Start: 2021-08-02 — End: 2022-05-17

## 2021-08-02 NOTE — Telephone Encounter (Signed)
Rx ok'd for gabapentin #180 with 2 refills.

## 2021-08-06 ENCOUNTER — Ambulatory Visit: Payer: PPO | Admitting: *Deleted

## 2021-08-06 DIAGNOSIS — M3501 Sicca syndrome with keratoconjunctivitis: Secondary | ICD-10-CM | POA: Diagnosis not present

## 2021-08-06 DIAGNOSIS — I10 Essential (primary) hypertension: Secondary | ICD-10-CM

## 2021-08-06 DIAGNOSIS — M25551 Pain in right hip: Secondary | ICD-10-CM

## 2021-08-06 DIAGNOSIS — J9611 Chronic respiratory failure with hypoxia: Secondary | ICD-10-CM

## 2021-08-06 DIAGNOSIS — S7291XD Unspecified fracture of right femur, subsequent encounter for closed fracture with routine healing: Secondary | ICD-10-CM

## 2021-08-06 NOTE — Chronic Care Management (AMB) (Signed)
  Care Management   Follow Up Note   08/06/2021 Name: Kimberly Roy MRN: [id number] DOB: 07-17-1931   Referred by: Einar Pheasant, MD Reason for referral : No chief complaint on file.   Successful telephone outreach to patient for follow up .  Patient answers and states it is not a good time to talk and request cal back another day and tine  Follow Up Plan: The care management team will reach out to the patient again over the next 20 days.   Hubert Azure RN, MSN RN Care Management Coordinator [place] [phone] [email or web address]

## 2021-08-07 ENCOUNTER — Ambulatory Visit: Payer: PPO | Admitting: Dermatology

## 2021-08-07 ENCOUNTER — Other Ambulatory Visit: Payer: Self-pay

## 2021-08-07 DIAGNOSIS — R21 Rash and other nonspecific skin eruption: Secondary | ICD-10-CM | POA: Diagnosis not present

## 2021-08-07 NOTE — Progress Notes (Signed)
   Follow-Up Visit   Subjective  Kimberly Roy is a 87 y.o. female who presents for the following: Follow-up (Patient here today for 1 week suture removal . She reports medications and cream helped and she has not noticed any new spots. ).  Rash fading with decreased erythema and edema  As  you have bumps continue with clobetasol until clear   Possible bug bits   The following portions of the chart were reviewed this encounter and updated as appropriate:      Review of Systems: No other skin or systemic complaints except as noted in HPI or Assessment and Plan.   Objective  Well appearing patient in no apparent distress; mood and affect are within normal limits.  A focused examination was performed including inferior left knee . Relevant physical exam findings are noted in the Assessment and Plan.  Right Knee - Anterior Healing biopsy sites L knee, Pt states other affected areas have cleared on foot (wearing compression hose today)  Assessment & Plan  Rash and other nonspecific skin eruption Right Knee - Anterior  Possible bite reaction- improving, no new lesions  Pathology showed: SUPERFICIAL AND DEEP PERIVASCULAR DERMATITIS WITH EOSINOPHILS, NEGATIVE FOR IMMUNOREACTANTS  Patient presents for suture removal. The wound is well healed without signs of infection.  The sutures are removed. Wound care and activity instructions given. Return prn.   Continue Clobetasol cream to aas qd/bid prn until rash cleared If bumps continue to come up, may need to get house checked for bedbugs  Related Medications clobetasol cream (TEMOVATE) 5.40 % Apply 1 application topically See admin instructions. Apply to affected areas of body 1 - 2 times daily as needed for itchy bumps. Avoid applying to face, groin, and axilla. Use as directed.  mupirocin ointment (BACTROBAN) 2 % Apply 1 application topically daily. Apply to left knee until healed  Return if symptoms worsen or fail to improve. I,  Ruthell Rummage, CMA, am acting as scribe for Brendolyn Patty, MD.  Documentation: I have reviewed the above documentation for accuracy and completeness, and I agree with the above.  Brendolyn Patty MD

## 2021-08-07 NOTE — Patient Instructions (Addendum)
Continue clobetasol to spot treat new itchy spots until clear  Avoid applying to face, groin, and axilla. Use as directed. Long-term use can cause thinning of the skin.  Topical steroids (such as triamcinolone, fluocinolone, fluocinonide, mometasone, clobetasol, halobetasol, betamethasone, hydrocortisone) can cause thinning and lightening of the skin if they are used for too long in the same area. Your physician has selected the right strength medicine for your problem and area affected on the body. Please use your medication only as directed by your physician to prevent side effects.    If You Need Anything After Your Visit  If you have any questions or concerns for your doctor, please call our main line at [phone] and press option 4 to reach your doctor's medical assistant. If no one answers, please leave a voicemail as directed and we will return your call as soon as possible. Messages left after 4 pm will be answered the following business day.   You may also send [place] a message via [place]. We typically respond to MyChart messages within 1-2 business days.  For prescription refills, please ask your pharmacy to contact our office. Our fax number is [phone].  If you have an urgent issue when the clinic is closed that cannot wait until the next business day, you can page your doctor at the number below.    Please note that while we do our best to be available for urgent issues outside of office hours, we are not available 24/7.   If you have an urgent issue and are unable to reach [place], you may choose to seek medical care at your doctor's office, retail clinic, urgent care center, or emergency room.  If you have a medical emergency, please immediately call 911 or go to the emergency department.  Pager Numbers  - Dr. Nehemiah Massed: [phone]  - Dr. Laurence Ferrari: [phone]  - Dr. Nicole Kindred: [phone]  In the event of inclement weather, please call our main line at [phone] for an  update on the status of any delays or closures.  Dermatology Medication Tips: Please keep the boxes that topical medications come in in order to help keep track of the instructions about where and how to use these. Pharmacies typically print the medication instructions only on the boxes and not directly on the medication tubes.   If your medication is too expensive, please contact our office at [phone] option 4 or send [place] a message through [place].   We are unable to tell what your co-pay for medications will be in advance as this is different depending on your insurance coverage. However, we may be able to find a substitute medication at lower cost or fill out paperwork to get insurance to cover a needed medication.   If a prior authorization is required to get your medication covered by your insurance company, please allow [place] 1-2 business days to complete this process.  Drug prices often vary depending on where the prescription is filled and some pharmacies may offer cheaper prices.  The website www.goodrx.com contains coupons for medications through different pharmacies. The prices here do not account for what the cost may be with help from insurance (it may be cheaper with your insurance), but the website can give you the price if you did not use any insurance.  - You can print the associated coupon and take it with your prescription to the pharmacy.  - You may also stop by our office during regular business hours and pick up a GoodRx coupon card.  -  If you need your prescription sent electronically to a different pharmacy, notify our office through [facility] or by phone at [phone] option 4.     Si Usted Necesita Algo Despus de Su Visita  Tambin puede enviarnos un mensaje a travs de Pharmacist, community. Por lo general respondemos a los mensajes de MyChart en el transcurso de 1 a 2 das hbiles.  Para renovar recetas, por favor pida a su farmacia que se ponga en contacto con  nuestra oficina. Harland Dingwall de fax es [place] [phone].  Si tiene un asunto urgente cuando la clnica est cerrada y que no puede esperar hasta el siguiente da hbil, puede llamar/localizar a su doctor(a) al nmero que aparece a continuacin.   Por favor, tenga en cuenta que aunque hacemos todo lo posible para estar disponibles para asuntos urgentes fuera del horario de [place], no estamos disponibles las 24 horas del da, los 7 das de la [place].   Si tiene un problema urgente y no puede comunicarse con nosotros, puede optar por buscar atencin mdica  en el consultorio de su doctor(a), en una clnica privada, en un centro de atencin urgente o en una sala de emergencias.  Si tiene Engineering geologist, por favor llame inmediatamente al 911 o vaya a la sala de emergencias.  Nmeros de bper  - Dr. Nehemiah Massed: [phone]  - Dra. Moye: [phone]  - Dra. Nicole Kindred: [phone]  En caso de inclemencias del [place], por favor llame a Johnsie Kindred principal al [phone] para una actualizacin sobre el [place] de cualquier retraso o cierre.  Consejos para la medicacin en dermatologa: Por favor, guarde las cajas en las que vienen los medicamentos de uso tpico para ayudarle a seguir las instrucciones sobre dnde y cmo usarlos. Las farmacias generalmente imprimen las instrucciones del medicamento slo en las cajas y no directamente en los tubos del [place].   Si su medicamento es muy caro, por favor, pngase en contacto con Zigmund Daniel llamando al [phone] y presione la opcin 4 o envenos un mensaje a travs de Pharmacist, community.   No podemos decirle cul ser su copago por los medicamentos por adelantado ya que esto es diferente dependiendo de la cobertura de su seguro. Sin embargo, es posible que podamos encontrar un medicamento sustituto a Electrical engineer un formulario para que el seguro cubra el medicamento que se considera necesario.   Si se requiere una autorizacin  previa para que su compaa de seguros [place] su medicamento, por favor permtanos de 1 a 2 das hbiles para completar este proceso.  Los precios de los medicamentos varan con frecuencia dependiendo del Environmental consultant de dnde se surte la receta y alguna farmacias pueden ofrecer precios ms baratos.  El sitio web www.goodrx.com tiene cupones para medicamentos de Airline pilot. Los precios aqu no tienen en cuenta lo que podra costar con la ayuda del seguro (puede ser ms barato con su seguro), pero el sitio web puede darle el precio si no utiliz Research scientist (physical sciences).  - Puede imprimir el cupn correspondiente y llevarlo con su receta a la farmacia.  - Tambin puede pasar por nuestra oficina durante el horario de atencin regular y Charity fundraiser una tarjeta de cupones de GoodRx.  - Si necesita que su receta se enve electrnicamente a una farmacia diferente, informe a nuestra oficina a travs de MyChart de [place] o por telfono llamando al [phone] y presione la opcin 4.

## 2021-08-08 DIAGNOSIS — M19012 Primary osteoarthritis, left shoulder: Secondary | ICD-10-CM | POA: Diagnosis not present

## 2021-08-08 DIAGNOSIS — G8929 Other chronic pain: Secondary | ICD-10-CM | POA: Diagnosis not present

## 2021-08-08 DIAGNOSIS — M25512 Pain in left shoulder: Secondary | ICD-10-CM | POA: Diagnosis not present

## 2021-08-13 ENCOUNTER — Ambulatory Visit: Payer: PPO | Admitting: *Deleted

## 2021-08-13 DIAGNOSIS — R339 Retention of urine, unspecified: Secondary | ICD-10-CM | POA: Diagnosis not present

## 2021-08-13 DIAGNOSIS — I10 Essential (primary) hypertension: Secondary | ICD-10-CM

## 2021-08-13 DIAGNOSIS — M1990 Unspecified osteoarthritis, unspecified site: Secondary | ICD-10-CM

## 2021-08-13 DIAGNOSIS — M810 Age-related osteoporosis without current pathological fracture: Secondary | ICD-10-CM

## 2021-08-13 NOTE — Chronic Care Management (AMB) (Signed)
Chronic Care Management   CCM RN Visit Note  08/13/2021 Name: Kimberly Roy MRN: [id number] DOB: 1930/09/09  Subjective: Kimberly Roy is a 87 y.o. year old female who is a primary care patient of Kimberly Pheasant, MD. The care management team was consulted for assistance with disease management and care coordination needs.    Engaged with patient by telephone for follow up visit in response to provider referral for case management and/or care coordination services.   Consent to Services:  The patient was given information about Chronic Care Management services, agreed to services, and gave verbal consent prior to initiation of services.  Please see initial visit note for detailed documentation.   Patient agreed to services and verbal consent obtained.   Assessment: Review of patient past medical history, allergies, medications, health status, including review of consultants reports, laboratory and other test data, was performed as part of comprehensive evaluation and provision of chronic care management services.   SDOH (Social Determinants of Health) assessments and interventions performed:    CCM Care Plan  Allergies  Allergen Reactions   Lyrica [Pregabalin] Swelling   Omnicef [Cefdinir] Diarrhea and Nausea And Vomiting   Atarax [Hydroxyzine]     jittery   Dicyclomine Other (See Comments)    Abdominal bloating    Hydroxyzine Hcl     jittery   Levaquin [Levofloxacin] Swelling   Nitrofurantoin Diarrhea   Nucynta Er [Tapentadol Hcl Er] Other (See Comments)    Severe constipation    Oxybutynin Other (See Comments)    Blurred vision   Zoloft [Sertraline Hcl]     Severe headache   Biaxin [Clarithromycin] Other (See Comments) and Rash    Pt does not remember Pt does not remember   Sertraline Nausea And Vomiting    Severe headache Severe headache Other reaction(s): Headache Severe headache   Sulfa Antibiotics Rash    Pt does not remember   Sulfasalazine Rash    Pt does not  remember   Tape Rash    [place] - redness   Tapentadol Other (See Comments) and Rash    _0     Outpatient Encounter Medications as of 08/13/2021  Medication Sig Note   albuterol (VENTOLIN HFA) 108 (90 Base) MCG/ACT inhaler INHALE 2 PUFFS INTO THE LUNGS EVERY 6 (SIX) HOURS AS NEEDED FOR WHEEZING OR SHORTNESS OF BREATH    aluminum-magnesium hydroxide-simethicone (MAALOX) 200-200-20 MG/5ML SUSP Take 15 mLs by mouth 4 (four) times daily -  before meals and at bedtime.  02/09/2021: Reports taking as needed   amLODipine (NORVASC) 10 MG tablet TAKE 1 TABLET BY MOUTH DAILY    aspirin 81 MG EC tablet Take by mouth.    atorvastatin (LIPITOR) 20 MG tablet Take 1 tablet (20 mg total) by mouth at bedtime.    Azelastine HCl 137 MCG/SPRAY SOLN PLACE 1 SPRAY INTO BOTH NOSTRILS TWO TIMES DAILY    benazepril (LOTENSIN) 40 MG tablet TAKE 1 TABLET BY MOUTH DAILY    betamethasone dipropionate 0.05 % cream Apply 1 application topically 2 (two) times daily. 02/09/2021: Reports uses as needed   budesonide-formoterol (SYMBICORT) 80-4.5 MCG/ACT inhaler Inhale 2 puffs into the lungs 2 (two) times daily. 02/09/2021: Reports not taking at this time   busPIRone (BUSPAR) 10 MG tablet Take 10 mg by mouth 2 (two) times daily.     calcium citrate-vitamin D (CITRACAL+D) 315-200 MG-UNIT tablet Take 1 tablet by mouth daily.    cephALEXin (KEFLEX) 250 MG  capsule Take 250 mg by mouth daily.    Cholecalciferol (VITAMIN D3) 1000 UNITS CAPS Take 1 capsule by mouth daily.    clobetasol cream (TEMOVATE) 2.72 % Apply 1 application topically See admin instructions. Apply to affected areas of body 1 - 2 times daily as needed for itchy bumps. Avoid applying to face, groin, and axilla. Use as directed.    Diclofenac Sodium 3 % GEL Apply topically 2 (two) times daily. 02/09/2021: As needed   Docusate Sodium (DSS) 100 MG CAPS Take by mouth.     DULoxetine (CYMBALTA) 60 MG capsule TAKE 1 CAPSULE BY MOUTH EVERY DAY.    ELMIRON 100 MG capsule Take 100 mg by mouth 3 (three) times daily.    fluocinonide (LIDEX) 0.05 % external solution Apply topically 2 (two) times daily. Apply drops to scalp for itch    furosemide (LASIX) 20 MG tablet TAKE 1 TABLET BY MOUTH DAILY AS NEEDED    gabapentin (NEURONTIN) 300 MG capsule Take two tablets tid (in am, midday and q hs).    HYDROcodone-acetaminophen (NORCO) 10-325 MG tablet Take 1 tablet by mouth 3 (three) times daily.    ketoconazole (NIZORAL) 2 % shampoo Apply 1 application topically See admin instructions. apply three times per week, massage into scalp and leave in for 10 minutes before rinsing out    magnesium oxide (MAG-OX) 400 MG tablet Take 1 tablet (400 mg total) by mouth daily.    Melatonin 10 MG CAPS Take 1 capsule by mouth.    methocarbamol (ROBAXIN) 500 MG tablet Take 500 mg by mouth 3 (three) times daily. As needed    metoprolol succinate (TOPROL-XL) 50 MG 24 hr tablet Take 1 tablet (50 mg total) by mouth daily. Take with or immediately following a meal.    montelukast (SINGULAIR) 10 MG tablet 10 mg once daily as needed    morphine (MS CONTIN) 60 MG 12 hr tablet Take 1 tablet (60 mg total) by mouth 2 (two) times daily.    mupirocin ointment (BACTROBAN) 2 % Apply 1 application topically 2 (two) times daily.    mupirocin ointment (BACTROBAN) 2 % Apply 1 application topically daily. Apply to left knee until healed    Naldemedine Tosylate 0.2 MG TABS Take 0.2 mg by mouth daily.    NYAMYC powder Apply topically.    polyethylene glycol powder (GLYCOLAX/MIRALAX) 17 GM/SCOOP powder MIX 17 GRAMS AS MARKED ON BOTTLE TOP IN 8 OUNCES OF WATER AND DRINK ONCE A DAY AS DIRECTED.    RABEprazole (ACIPHEX) 20 MG tablet TAKE 1 TABLET BY MOUTH TWICE A DAY BEFORE A MEAL    senna (SENOKOT) 8.6 MG TABS tablet Take 1 tablet by mouth 2 (two) times daily.    Simethicone (GAS-X PO) Take 2 tablets by mouth 2 (two)  times daily.    sodium chloride (OCEAN) 0.65 % nasal spray Place 1 spray into the nose as needed.    SYNTHROID 100 MCG tablet Take 1 tablet (100 mcg total) by mouth daily.    triamcinolone ointment (KENALOG) 0.1 % APPLY TWICE DAILY TO BITES AND RASH UNTIL FLAT AND SMOOTH **DO NOT APPLY TO FACE**    No facility-administered encounter medications on file as of 08/13/2021.    Patient Active Problem List   Diagnosis Date Noted   Rash 03/03/2021   Skin lesion 03/03/2021   Aortic atherosclerosis ([place]) 11/20/2020   Bradycardia 06/08/2020   Itching 04/08/2020   Dyspnea 01/17/2020   Chronic respiratory failure with hypoxia ([place]) 01/17/2020   Right  hip pain 01/10/2020   Leukocytosis 11/15/2019   Wound of buttock 10/25/2019   COVID-19 virus infection 10/04/2019   Urinary frequency 09/26/2019   Cold feeling 09/12/2019   Left knee pain 07/04/2019   Hemoptysis 06/30/2019   Femur fracture ([place]) 08/06/2018   Primary osteoarthritis of right shoulder 08/15/2017   Right shoulder pain 05/16/2017   Chronic venous insufficiency 02/10/2017   Lymphedema 02/10/2017   Neuropathy 12/25/2016   Anemia due to blood loss, chronic 08/07/2016   GERD (gastroesophageal reflux disease) 12/10/2015   Finger pain 11/13/2015   Carotid artery calcification 09/26/2015   External nasal lesion 09/25/2015   Muscle cramps 09/01/2015   Excessive sweating 07/30/2015   Headache 07/16/2015   Groin pain 05/14/2015   Muscle twitching 03/05/2015   Leg cramps 02/11/2015   Back pain 02/11/2015   Abdominal pain 02/11/2015   Chronic pain 01/19/2015   Acute cystitis without hematuria 12/12/2014   Left elbow pain 11/30/2014   Health care maintenance 11/30/2014   Osteoporosis 10/19/2014   Rectal bleeding 10/19/2014   Neck pain 09/03/2014   Unsteady gait 09/03/2014   Nocturia 09/03/2014   Dysphagia 06/01/2014   Nasal dryness 06/01/2014   Stress 06/01/2014   Fatigue 02/27/2014   Degenerative disc disease 12/21/2013    Pre-op evaluation 10/12/2013   Hoarseness 08/14/2013   Leg swelling 01/24/2013   CHF (congestive heart failure) ([place]) 12/29/2012   Cough 12/29/2012   OSA (obstructive sleep apnea) 07/09/2012   Osteoarthritis 07/04/2012   Symptomatic anemia 07/04/2012   Chronic constipation 07/04/2012   Pulmonary hypertension ([place]) 07/04/2012   Pulmonary nodules 07/04/2012   Hypertension 07/04/2012   Hyperlipidemia 07/04/2012   Hypothyroidism 07/04/2012    Conditions to be addressed/monitored:HTN and Pain  Care Plan : RNCM Hypertension & Chronic Pain  (Adult)  Updates made by Leona Singleton, RN since 08/13/2021 12:00 AM     Problem: Hypertension  & Chronic Pain   Priority: Medium     Long-Range Goal: Hypertension & Chronic Pain Monitored   Start Date: 02/09/2021  Expected End Date: 08/31/2021  This Visit's Progress: Not on track  Recent Progress: Not on track  Priority: Medium  Note:   Objective:  Last practice recorded BP readings:  BP Readings from Last 3 Encounters:  06/27/21 117/61  05/21/21 124/60  04/27/21 116/72  Current Barriers:  Knowledge Deficits related to basic understanding of hypertension pathophysiology and self care management; patient reports she does not monitor blood pressures often at home.  Does state that when she does it is typically normal.  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  Continues to report not monitoring blood pressures, regularly.  Discussed importance of monitoring blood pressure and patient agrees to check weekly.  Knowledge Deficits related to self-health management of chronic pain; patient reporting severe pain to shoulder.  States her shoulder is "bone on bone" and she receives injections every 3 months.  Reports pain is causing her to have to sleep in recliner. Feels like current pain is being managed at this time.  Reports today is a good day.  Received shoulder injection last week; denies any current issues or  concerns Chronic Disease Management support and education needs related to chronic pain  Case Manager Clinical Goal(s):  patient will verbalize understanding of plan for hypertension management patient will demonstrate improved adherence to prescribed treatment plan for hypertension as evidenced by taking all medications as prescribed, monitoring and recording blood pressure as directed, adhering to low  sodium/DASH diet patient will verbalize understanding of plan for pain management. , patient will demonstrate use of different relaxation  skills and/or diversional activities to assist with pain reduction (distraction, imagery, relaxation, massage, acupressure, TENS, heat, and cold application., patient will use pharmacological and nonpharmacological pain relief strategies as prescribed. , and patient will verbalize acceptable level of pain relief and ability to engage in desired activities  Pain:  (Status: Goal on Track (progressing): YES.) Long Term Goal  Pain assessment performed Medications reviewed Reviewed provider established plan for pain management; Discussed importance of adherence to all scheduled medical appointments; Counseled on the importance of reporting any/all new or changed pain symptoms or management strategies to pain management provider; Advised patient to report to care team affect of pain on daily activities; Discussed use of relaxation techniques and/or diversional activities to assist with pain reduction (distraction, imagery, relaxation, massage, acupressure, TENS, heat, and cold application; Reviewed with patient prescribed pharmacological and nonpharmacological pain relief strategies;   HTN:  (Status: Goal on track: NO.) Long Term Goal  Interventions:  Pain assessment performed Medications reviewed Collaboration with Kimberly Pheasant, MD regarding development and update of comprehensive plan of care as evidenced by provider attestation and  co-signature Inter-disciplinary care team collaboration (see longitudinal plan of care) Evaluation of current treatment plan related to hypertension self management and patient's adherence to plan as established by provider. Provided education to patient re: stroke prevention, s/s of heart attack and stroke, DASH diet, complications of uncontrolled blood pressure Reviewed medications with patient and discussed importance of compliance Discussed plans with patient for ongoing care management follow up and provided patient with direct contact information for care management team Advised patient, providing education and rationale, to monitor blood pressure weekly and record, calling PCP for findings outside established parameters.  Encouraged to keep and attend scheduled medical appointments Encouraged to call provider office for new concerns, questions, or BP outside discussed parameters Encouraged to follow a low sodium diet/DASH diet Encouraged to check blood pressure weekly and write blood pressure results in a log to take to appointments for provider review discusssed monitoring more frequently (daily or 3 times a week) if blood pressures are more elevated notifying provider for sustained elevations Discussed importance of monitoring blood pressure   Patient Goals/Self-Care Activities:: Check blood pressure weekly or more frequently if elevated Write blood pressure results in a log and take log to appointments for provider review Continue to work with CCM Education officer, museum for resources for your son Learn relaxation techniques and use relaxation during pain  Practice acceptance of chronic pain Practice relaxation or meditation daily Tell myself I can (not I can't) Use distraction techniques Take medication as prescribed Use Ice and Heat to help with pain  Follow Up Plan: The care management team will reach out to the patient again over the next 45 business days.     Care Plan : Chronic Pain  (Adult)  Updates made by Leona Singleton, RN since 08/13/2021 12:00 AM  Completed 08/13/2021   Problem: Pain Management Plan (Chronic Pain) Resolved 08/13/2021  Priority: Medium  Note:   RESOLVING DUE TO DUPLICATE GOALS    Long-Range Goal: Pain Management Plan Developed Completed 08/13/2021  Start Date: 02/09/2021  Expected End Date: 12/30/2021  Recent Progress: On track  Priority: Medium  Note:   RESOLVING DUE TO DUPLICATE GOALS   Current Barriers:  Knowledge Deficits related to self-health management of chronic pain; patient reporting severe pain to shoulder.  States her shoulder is "bone on  bone" and she receives injections every 3 months.  Reports pain is causing her to have to sleep in recliner. Feels like current pain is being managed at this time Chronic Disease Management support and education needs related to chronic pain Clinical Goal(s):  patient will verbalize understanding of plan for pain management. , patient will demonstrate use of different relaxation  skills and/or diversional activities to assist with pain reduction (distraction, imagery, relaxation, massage, acupressure, TENS, heat, and cold application., patient will use pharmacological and nonpharmacological pain relief strategies as prescribed. , and patient will verbalize acceptable level of pain relief and ability to engage in desired activities Interventions:  Collaboration with Kimberly Pheasant, MD regarding development and update of comprehensive plan of care as evidenced by provider attestation and co-signature Pain assessment performed Medications reviewed and encouraged medication compliance Discussed plans with patient for ongoing care management follow up and provided patient with direct contact information for care management team Evaluation of current treatment plan related to pain and patient's adherence to plan as established by provider. Pain assessed and pain treatment goals reviewed Patient  response to treatment assessed Discussed using ice and heat to help with pain Acknowledged patient as the expert in pain self-management Discussed urinary bladder training exercises (bladder insulation) and thinks she has pulled her left groin muscle during these therapies Discussed importance of monitor self for alertness/mentation with newly prescribed muscle relaxant with other narcotic medications No significant interventions updated/changed Patient Goals/Self Care Activities:  Learn relaxation techniques and use relaxation during pain  Practice acceptance of chronic pain Practice relaxation or meditation daily Tell myself I can (not I can't) Use distraction techniques Take medication as prescribed Use Ice and Heat to help with pain Follow Up Plan: The care management team will reach out to the patient again over the next 45 business days.       Plan:The care management team will reach out to the patient again over the next 45 days.  Hubert Azure RN, MSN RN Care Management Coordinator [place] [phone] [email or web address]

## 2021-08-13 NOTE — Patient Instructions (Signed)
Visit Information  Thank you for taking time to visit with me today. Please don't hesitate to contact me if I can be of assistance to you before our next scheduled telephone appointment.  Following are the goals we discussed today:  (Copy and paste patient goals from clinical care plan here)  Our next appointment is by telephone on 09/24/21 at 1130  Please call the care guide team at [phone] if you need to cancel or reschedule your appointment.   If you are experiencing a Mental Health or [place] or need someone to talk to, please call the Suicide and Crisis Lifeline: 988 call the [place] National Suicide Prevention Lifeline: 307-099-3969 or TTY: (320)631-1299 TTY 570-763-8201) to talk to a trained counselor call 1-800-273-TALK (toll free, 24 hour hotline) call 911   Patient verbalizes understanding of instructions provided today and agrees to view in [place].   Hubert Azure RN, MSN RN Care Management Coordinator [place] [phone] [email or web address]

## 2021-09-04 ENCOUNTER — Other Ambulatory Visit: Payer: Self-pay | Admitting: Internal Medicine

## 2021-09-04 ENCOUNTER — Encounter: Payer: Self-pay | Admitting: Internal Medicine

## 2021-09-05 ENCOUNTER — Ambulatory Visit: Payer: Medicare Other | Admitting: Dermatology

## 2021-09-05 ENCOUNTER — Other Ambulatory Visit: Payer: Self-pay

## 2021-09-05 ENCOUNTER — Telehealth: Payer: Self-pay | Admitting: Internal Medicine

## 2021-09-05 DIAGNOSIS — T148XXA Other injury of unspecified body region, initial encounter: Secondary | ICD-10-CM

## 2021-09-05 DIAGNOSIS — S00411A Abrasion of right ear, initial encounter: Secondary | ICD-10-CM

## 2021-09-05 DIAGNOSIS — L011 Impetiginization of other dermatoses: Secondary | ICD-10-CM

## 2021-09-05 DIAGNOSIS — L219 Seborrheic dermatitis, unspecified: Secondary | ICD-10-CM

## 2021-09-05 MED ORDER — DOXYCYCLINE MONOHYDRATE 100 MG PO CAPS: 100.0000 mg | ORAL_CAPSULE | Freq: Two times a day (BID) | ORAL | 0 refills | Status: AC

## 2021-09-05 NOTE — Telephone Encounter (Signed)
[facility]

## 2021-09-05 NOTE — Telephone Encounter (Signed)
LM that medication was sent in for patient.

## 2021-09-05 NOTE — Patient Instructions (Addendum)
Right ear and face - Avoid picking/scratching Start Zoryve Cream (samples) apply to affected areas on right ear and right face twice a day until areas improved.   Continue mupirocin ointment Apply after Zoryve cream to affected areas right ear and right face twice a day until improved.   Start doxycycline 165m take 1 pill twice a day with food x 1 week. Doxycycline should be taken with food to prevent nausea. Do not lay down for 30 minutes after taking. Be cautious with sun exposure and use good sun protection while on this medication. Pregnant women should not take this medication.    Scalp Start Zoryve Cream Apply 1-2 times a day to itchy area on the back of scalp at hairline until improved.   Continue ketoconazole shampoo - Apply to scalp and let sit several minutes before rinsing.  Continue fluocinonide solution Apply 1-2 drops to itchy spots in scalp once or twice daily as needed. Avoid face.    If You Need Anything After Your Visit  If you have any questions or concerns for your doctor, please call our main line at [phone] press option 4 to reach your doctor's medical assistant. If no one answers, please leave a voicemail as directed and we will return your call as soon as possible. Messages left after 4 pm will be answered the following business day.   You may also send [place] message via [place] We typically respond to MyChart messages within 1-2 business days.  For prescription refills, please ask your pharmacy to contact our office. Our fax number is [phone]  If you have an urgent issue when the clinic is closed that cannot wait until the next business day, you can page your doctor at the number below.    Please note that while we do our best to be available for urgent issues outside of office hours, we are not available 24/7.   If you have an urgent issue and are unable to reach [place] you may choose to seek medical care at your doctor's office, retail clinic,  urgent care center, or emergency room.  If you have a medical emergency, please immediately call 911 or go to the emergency department.  Pager Numbers  - Dr. KNehemiah Massed [phone] - Dr. MLaurence Ferrari [phone] - Dr. SNicole Kindred [phone] In the event of inclement weather, please call our main line at [phone] an update on the status of any delays or closures.  Dermatology Medication Tips: Please keep the boxes that topical medications come in in order to help keep track of the instructions about where and how to use these. Pharmacies typically print the medication instructions only on the boxes and not directly on the medication tubes.   If your medication is too expensive, please contact our office at [phone] 4 or send [place] message through [place]   We are unable to tell what your co-pay for medications will be in advance as this is different depending on your insurance coverage. However, we may be able to find a substitute medication at lower cost or fill out paperwork to get insurance to cover a needed medication.   If a prior authorization is required to get your medication covered by your insurance company, please allow u[place]1-2 business days to complete this process.  Drug prices often vary depending on where the prescription is filled and some pharmacies may offer cheaper prices.  The website www.goodrx.com contains coupons for medications through different pharmacies. The prices here  do not account for what the cost may be with help from insurance (it may be cheaper with your insurance), but the website can give you the price if you did not use any insurance.  - You can print the associated coupon and take it with your prescription to the pharmacy.  - You may also stop by our office during regular business hours and pick up a GoodRx coupon card.  - If you need your prescription sent electronically to a different pharmacy, notify our office through [facility] or by phone at [phone] option 4.     Si Usted Necesita Algo Despus de Su Visita  Tambin puede enviarnos un mensaje a travs de Pharmacist, community. Por lo general respondemos a los mensajes de MyChart en el transcurso de 1 a 2 das hbiles.  Para renovar recetas, por favor pida a su farmacia que se ponga en contacto con nuestra oficina. Harland Dingwall de fax es [place] [phone].  Si tiene un asunto urgente cuando la clnica est cerrada y que no puede esperar hasta el siguiente da hbil, puede llamar/localizar a su doctor(a) al nmero que aparece a continuacin.   Por favor, tenga en cuenta que aunque hacemos todo lo posible para estar disponibles para asuntos urgentes fuera del horario de [place], no estamos disponibles las 24 horas del da, los 7 das de la [place].   Si tiene un problema urgente y no puede comunicarse con nosotros, puede optar por buscar atencin mdica  en el consultorio de su doctor(a), en una clnica privada, en un centro de atencin urgente o en una sala de emergencias.  Si tiene Engineering geologist, por favor llame inmediatamente al 911 o vaya a la sala de emergencias.  Nmeros de bper  - Dr. Nehemiah Massed: [phone]  - Dra. Moye: [phone]  - Dra. Nicole Kindred: [phone]  En caso de inclemencias del [place], por favor llame a Johnsie Kindred principal al [phone] para una actualizacin sobre el [place] de cualquier retraso o cierre.  Consejos para la medicacin en dermatologa: Por favor, guarde las cajas en las que vienen los medicamentos de uso tpico para ayudarle a seguir las instrucciones sobre dnde y cmo usarlos. Las farmacias generalmente imprimen las instrucciones del medicamento slo en las cajas y no directamente en los tubos del [place].   Si su medicamento es muy caro, por favor, pngase en contacto con Zigmund Daniel llamando al [phone] y presione la opcin 4 o envenos un mensaje a travs de Pharmacist, community.   No podemos decirle cul  ser su copago por los medicamentos por adelantado ya que esto es diferente dependiendo de la cobertura de su seguro. Sin embargo, es posible que podamos encontrar un medicamento sustituto a Electrical engineer un formulario para que el seguro cubra el medicamento que se considera necesario.   Si se requiere una autorizacin previa para que su compaa de seguros [place] su medicamento, por favor permtanos de 1 a 2 das hbiles para completar este proceso.  Los precios de los medicamentos varan con frecuencia dependiendo del Environmental consultant de dnde se surte la receta y alguna farmacias pueden ofrecer precios ms baratos.  El sitio web www.goodrx.com tiene cupones para medicamentos de Airline pilot. Los precios aqu no tienen en cuenta lo que podra costar con la ayuda del seguro (puede ser ms barato con su seguro), pero el sitio web puede darle el precio si no utiliz Research scientist (physical sciences).  - Puede imprimir el cupn correspondiente y llevarlo con su receta a la farmacia.  -  Tambin puede pasar por nuestra oficina durante el horario de atencin regular y Charity fundraiser una tarjeta de cupones de GoodRx.  - Si necesita que su receta se enve electrnicamente a una farmacia diferente, informe a nuestra oficina a travs de MyChart de [place] o por telfono llamando al [phone] y presione la opcin 4.

## 2021-09-05 NOTE — Progress Notes (Signed)
° °  Follow-Up Visit   Subjective  Kimberly Roy is a 88 y.o. female who presents for the following: Spot (Right ear x 2-3 months. Very itchy and painful. Has tried 1% HC cream, mupirocin ointment, and clobetasol cream. Patient scratches and picks at. Hasn't worn earrings in 3 months. ) and Seborrheic Dermatitis (Scalp. Patient Ketoconazole shampoo and lidex solution not helping. Itchy. ).   The following portions of the chart were reviewed this encounter and updated as appropriate:       Review of Systems:  No other skin or systemic complaints except as noted in HPI or Assessment and Plan.  Objective  Well appearing patient in no apparent distress; mood and affect are within normal limits.  A focused examination was performed including scalp, ears, face. Relevant physical exam findings are noted in the Assessment and Plan.  Right Ear Crusted excoriation of the right earlobe;  excoriations on right preauricular, right mandible, right antihelix.  scalp Mild erythema at occipital hairline.    Assessment & Plan  Excoriation Right Ear  With secondary impetiginization   Avoid picking/scratching so areas can heal.  Start doxycycline 114m take 1 po BID x 7 days dsp #14 0Rf.  Continue mupirocin ointment BID to sores. Pt has at home.   Start Zoryve Cream Apply BID for itching (apply first, then mupirocin) to AA until improved.  Samples x 2 given. Lot TDCA-2 Exp 4/24  Doxycycline should be taken with food to prevent nausea. Do not lay down for 30 minutes after taking. Be cautious with sun exposure and use good sun protection while on this medication. Pregnant women should not take this medication.    doxycycline (MONODOX) 100 MG capsule - Right Ear Take 1 capsule (100 mg total) by mouth 2 (two) times daily for 7 days. Take with food.  Seborrheic dermatitis scalp  Improving  Seborrheic Dermatitis  -  is a chronic persistent rash characterized by pinkness and scaling most  commonly of the mid face but also can occur on the scalp (dandruff), ears; mid chest, mid back and groin.  It tends to be exacerbated by stress and cooler weather.  People who have neurologic disease may experience new onset or exacerbation of existing seborrheic dermatitis.  The condition is not curable but treatable and can be controlled.  Continue ketoconazole shampoo Apply to scalp and let sit several minutes before rinsing. Pt has refills.    Continue fluocinonide solution apply a few drops to itchy areas in scalp qd/bid prn itch. Pt has refills.   Can try Zoryve Cream samples prn- Spot treat itchy areas on scalp/hairline twice daily until improved.    Related Medications ketoconazole (NIZORAL) 2 % shampoo Apply 1 application topically See admin instructions. apply three times per week, massage into scalp and leave in for 10 minutes before rinsing out  fluocinonide (LIDEX) 0.05 % external solution Apply topically 2 (two) times daily. Apply drops to scalp for itch   Return in about 1 month (around 10/06/2021).  I,Jamesetta Orleans CMA, am acting as scribe for TBrendolyn Patty MD . Documentation: I have reviewed the above documentation for accuracy and completeness, and I agree with the above.  TBrendolyn PattyMD

## 2021-09-11 ENCOUNTER — Telehealth: Payer: Self-pay | Admitting: Internal Medicine

## 2021-09-11 NOTE — Telephone Encounter (Signed)
Pt called wanting the provider to put in another order for a bone density test

## 2021-09-13 ENCOUNTER — Other Ambulatory Visit: Payer: Self-pay

## 2021-09-13 DIAGNOSIS — E2839 Other primary ovarian failure: Secondary | ICD-10-CM

## 2021-09-13 DIAGNOSIS — M810 Age-related osteoporosis without current pathological fracture: Secondary | ICD-10-CM

## 2021-09-13 NOTE — Telephone Encounter (Signed)
Bone density order was placed 12/2020.  Should not need another order.  Ok to schedule bone density.

## 2021-09-13 NOTE — Telephone Encounter (Signed)
Reordered dexa scan. [place] could not see it. Patient is aware

## 2021-09-19 ENCOUNTER — Other Ambulatory Visit: Payer: Self-pay | Admitting: *Deleted

## 2021-09-19 DIAGNOSIS — D500 Iron deficiency anemia secondary to blood loss (chronic): Secondary | ICD-10-CM

## 2021-09-19 DIAGNOSIS — D649 Anemia, unspecified: Secondary | ICD-10-CM

## 2021-09-21 DIAGNOSIS — M19011 Primary osteoarthritis, right shoulder: Secondary | ICD-10-CM | POA: Diagnosis not present

## 2021-09-21 DIAGNOSIS — M48061 Spinal stenosis, lumbar region without neurogenic claudication: Secondary | ICD-10-CM | POA: Diagnosis not present

## 2021-09-21 DIAGNOSIS — Q675 Congenital deformity of spine: Secondary | ICD-10-CM | POA: Diagnosis not present

## 2021-09-21 DIAGNOSIS — M5416 Radiculopathy, lumbar region: Secondary | ICD-10-CM | POA: Diagnosis not present

## 2021-09-21 DIAGNOSIS — Z79899 Other long term (current) drug therapy: Secondary | ICD-10-CM | POA: Diagnosis not present

## 2021-09-21 DIAGNOSIS — K5903 Drug induced constipation: Secondary | ICD-10-CM | POA: Diagnosis not present

## 2021-09-21 DIAGNOSIS — M519 Unspecified thoracic, thoracolumbar and lumbosacral intervertebral disc disorder: Secondary | ICD-10-CM | POA: Diagnosis not present

## 2021-09-21 DIAGNOSIS — M19012 Primary osteoarthritis, left shoulder: Secondary | ICD-10-CM | POA: Diagnosis not present

## 2021-09-21 DIAGNOSIS — S76312A Strain of muscle, fascia and tendon of the posterior muscle group at thigh level, left thigh, initial encounter: Secondary | ICD-10-CM | POA: Diagnosis not present

## 2021-09-24 ENCOUNTER — Ambulatory Visit: Payer: PPO | Admitting: *Deleted

## 2021-09-24 DIAGNOSIS — J9611 Chronic respiratory failure with hypoxia: Secondary | ICD-10-CM

## 2021-09-24 DIAGNOSIS — I10 Essential (primary) hypertension: Secondary | ICD-10-CM

## 2021-09-24 NOTE — Chronic Care Management (AMB) (Signed)
Care Management   Follow Up Note   09/24/2021 Name: Kimberly Roy MRN: [id number] DOB: Oct 15, 1930   Referred by: Einar Pheasant, MD Reason for referral : Chronic Care Management (COPD, HTN)   Successful telephone outreach to patient for follow up.  Patient at another appointment and request telephone call back.  Follow Up Plan: The care management team will reach out to the patient again over the next 30 days.   Hubert Azure RN, MSN RN Care Management Coordinator [place] [phone] [email or web address]

## 2021-09-26 ENCOUNTER — Inpatient Hospital Stay: Payer: Medicare Other | Attending: Internal Medicine

## 2021-09-26 ENCOUNTER — Encounter: Payer: Self-pay | Admitting: Internal Medicine

## 2021-09-26 ENCOUNTER — Inpatient Hospital Stay: Payer: Medicare Other

## 2021-09-26 ENCOUNTER — Other Ambulatory Visit: Payer: Self-pay

## 2021-09-26 ENCOUNTER — Inpatient Hospital Stay: Payer: Medicare Other | Admitting: Internal Medicine

## 2021-09-26 DIAGNOSIS — N3010 Interstitial cystitis (chronic) without hematuria: Secondary | ICD-10-CM | POA: Diagnosis not present

## 2021-09-26 DIAGNOSIS — Z8719 Personal history of other diseases of the digestive system: Secondary | ICD-10-CM | POA: Diagnosis not present

## 2021-09-26 DIAGNOSIS — D500 Iron deficiency anemia secondary to blood loss (chronic): Secondary | ICD-10-CM | POA: Diagnosis not present

## 2021-09-26 DIAGNOSIS — I272 Pulmonary hypertension, unspecified: Secondary | ICD-10-CM | POA: Insufficient documentation

## 2021-09-26 DIAGNOSIS — Z823 Family history of stroke: Secondary | ICD-10-CM | POA: Insufficient documentation

## 2021-09-26 DIAGNOSIS — Z881 Allergy status to other antibiotic agents status: Secondary | ICD-10-CM | POA: Diagnosis not present

## 2021-09-26 DIAGNOSIS — D649 Anemia, unspecified: Secondary | ICD-10-CM

## 2021-09-26 DIAGNOSIS — E039 Hypothyroidism, unspecified: Secondary | ICD-10-CM | POA: Insufficient documentation

## 2021-09-26 DIAGNOSIS — E611 Iron deficiency: Secondary | ICD-10-CM | POA: Diagnosis not present

## 2021-09-26 DIAGNOSIS — M1990 Unspecified osteoarthritis, unspecified site: Secondary | ICD-10-CM | POA: Diagnosis not present

## 2021-09-26 DIAGNOSIS — Z9049 Acquired absence of other specified parts of digestive tract: Secondary | ICD-10-CM | POA: Insufficient documentation

## 2021-09-26 DIAGNOSIS — M2550 Pain in unspecified joint: Secondary | ICD-10-CM | POA: Insufficient documentation

## 2021-09-26 DIAGNOSIS — Z882 Allergy status to sulfonamides status: Secondary | ICD-10-CM | POA: Diagnosis not present

## 2021-09-26 DIAGNOSIS — R5382 Chronic fatigue, unspecified: Secondary | ICD-10-CM | POA: Diagnosis not present

## 2021-09-26 DIAGNOSIS — I110 Hypertensive heart disease with heart failure: Secondary | ICD-10-CM | POA: Insufficient documentation

## 2021-09-26 DIAGNOSIS — I509 Heart failure, unspecified: Secondary | ICD-10-CM | POA: Insufficient documentation

## 2021-09-26 DIAGNOSIS — D539 Nutritional anemia, unspecified: Secondary | ICD-10-CM | POA: Insufficient documentation

## 2021-09-26 DIAGNOSIS — Z86718 Personal history of other venous thrombosis and embolism: Secondary | ICD-10-CM | POA: Insufficient documentation

## 2021-09-26 DIAGNOSIS — Z79899 Other long term (current) drug therapy: Secondary | ICD-10-CM | POA: Insufficient documentation

## 2021-09-26 DIAGNOSIS — Z8249 Family history of ischemic heart disease and other diseases of the circulatory system: Secondary | ICD-10-CM | POA: Insufficient documentation

## 2021-09-26 DIAGNOSIS — M549 Dorsalgia, unspecified: Secondary | ICD-10-CM | POA: Insufficient documentation

## 2021-09-26 DIAGNOSIS — G4733 Obstructive sleep apnea (adult) (pediatric): Secondary | ICD-10-CM | POA: Insufficient documentation

## 2021-09-26 DIAGNOSIS — Z888 Allergy status to other drugs, medicaments and biological substances status: Secondary | ICD-10-CM | POA: Insufficient documentation

## 2021-09-26 LAB — BASIC METABOLIC PANEL
Anion gap: 8 (ref 5–15)
BUN: 14 mg/dL (ref 8–23)
CO2: 31 mmol/L (ref 22–32)
Calcium: 9.7 mg/dL (ref 8.9–10.3)
Chloride: 99 mmol/L (ref 98–111)
Creatinine, Ser: 0.69 mg/dL (ref 0.44–1.00)
GFR, Estimated: >60 mL/min (ref >60)
Glucose, Bld: 78 mg/dL (ref 70–99)
Potassium: 3.9 mmol/L (ref 3.5–5.1)
Sodium: 138 mmol/L (ref 135–145)

## 2021-09-26 LAB — CBC WITH DIFFERENTIAL/PLATELET
Abs Immature Granulocytes: 0.03 10*3/uL (ref 0.00–0.07)
Basophils Absolute: 0 10*3/uL (ref 0.0–0.1)
Basophils Relative: 1 %
Eosinophils Absolute: 0.2 10*3/uL (ref 0.0–0.5)
Eosinophils Relative: 3 %
HCT: 33.7 % — ABNORMAL LOW (ref 36.0–46.0)
Hemoglobin: 10.8 g/dL — ABNORMAL LOW (ref 12.0–15.0)
Immature Granulocytes: 1 %
Lymphocytes Relative: 23 %
Lymphs Abs: 1.5 10*3/uL (ref 0.7–4.0)
MCH: 32.9 pg (ref 26.0–34.0)
MCHC: 32 g/dL (ref 30.0–36.0)
MCV: 102.7 fL — ABNORMAL HIGH (ref 80.0–100.0)
Monocytes Absolute: 0.5 10*3/uL (ref 0.1–1.0)
Monocytes Relative: 8 %
Neutro Abs: 4.2 10*3/uL (ref 1.7–7.7)
Neutrophils Relative %: 64 %
Platelets: 130 10*3/uL — ABNORMAL LOW (ref 150–400)
RBC: 3.28 MIL/uL — ABNORMAL LOW (ref 3.87–5.11)
RDW: 13.4 % (ref 11.5–15.5)
WBC: 6.5 10*3/uL (ref 4.0–10.5)
nRBC: 0 % (ref 0.0–0.2)

## 2021-09-26 LAB — IRON AND TIBC
Iron: 109 ug/dL (ref 28–170)
Saturation Ratios: 41 % — ABNORMAL HIGH (ref 10.4–31.8)
TIBC: 263 ug/dL (ref 250–450)
UIBC: 154 ug/dL

## 2021-09-26 LAB — FERRITIN: Ferritin: 102 ng/mL (ref 11–307)

## 2021-09-26 MED ORDER — SODIUM CHLORIDE 0.9 % IV SOLN: 200.0000 mg | Freq: Once | INTRAVENOUS | Status: DC

## 2021-09-26 MED ORDER — SODIUM CHLORIDE 0.9 % IV SOLN
Freq: Once | INTRAVENOUS | Status: AC
  Filled 2021-09-26: qty 250

## 2021-09-26 MED ORDER — IRON SUCROSE 20 MG/ML IV SOLN
200.0000 mg | Freq: Once | INTRAVENOUS | Status: AC
  Administered 2021-09-26: 15:00:00 200 mg via INTRAVENOUS
  Filled 2021-09-26: qty 10

## 2021-09-26 NOTE — Progress Notes (Signed)
Pt tolerated all infusions well today with no problems or complaints.  Pt left infusion suite stable in a wheelchair.

## 2021-09-26 NOTE — Progress Notes (Signed)
Pt has concerns that her son or daughter may have low blood counts like her. She is wanting to know if there is anything she could do? You had discussed something about bone marrow at some [place] she states.

## 2021-09-26 NOTE — Progress Notes (Signed)
[name], [name], [name], [name], [name], [name], [name]  CHIEF COMPLAINTS/PURPOSE OF CONSULTATION: Anemia  # CHRONIC MILD ANEMIA hb ~11.[Previous Dr.Gittin pt]; [place] 2017- work up [name]; stool card; Iron/ monoclonal work up- NEGATIVE] ; MARCH 2022- B12 WNL.   # CHRONIC INTERSTITIAL CYSTITIS/ wheel chair bound  Oncology History   No history exists.    HISTORY OF PRESENTING ILLNESS: alone; in wheel chair.  Kimberly Roy 88 y.o.  female  with a history of chronic anemia- is here for a follow up.   Patient admits to moderate to severe fatigue.  Slight improvement after Venofer. Denies any blood in stools or black-colored stools.  No nausea no vomiting.  No abdominal pain.  No easy bleeding/bruising noted.   Review of Systems  Constitutional:  Positive for malaise/fatigue. Negative for chills, diaphoresis and fever.  HENT:  Negative for nosebleeds and sore throat.   Eyes:  Negative for double vision.  Respiratory:  Negative for cough, hemoptysis, sputum production, shortness of breath and wheezing.   Cardiovascular:  Negative for chest pain, palpitations, orthopnea and leg swelling.  Gastrointestinal:  Negative for abdominal pain, blood in stool, constipation, diarrhea, heartburn, melena, nausea and vomiting.  Genitourinary:  Negative for dysuria, frequency and urgency.  Musculoskeletal:  Positive for back pain and joint pain.  Skin: Negative.  Negative for itching and rash.  Neurological:  Negative for dizziness, tingling, focal weakness, weakness and headaches.  Endo/Heme/Allergies:  Does not bruise/bleed easily.  Psychiatric/Behavioral:  Negative for depression. The patient is not  nervous/anxious and does not have insomnia.     MEDICAL HISTORY:  Past Medical History:  Diagnosis Date   Anemia    Anxiety    Chest pain    CHF (congestive heart failure) (HCC)    Constipation    DDD (degenerative disc disease), cervical    Depression    DVT (deep venous thrombosis) (HCC)    Dysphonia    Dyspnea    Fatty liver    Fatty liver    Headache    Hyperlipidemia    Hyperpiesia    Hypertension    Hypothyroidism    Interstitial cystitis    Left ventricular dysfunction    Lymphedema    Nephrolithiasis    Obstructive sleep apnea    Osteoarthritis    knees/cervical and lumbar spine   Pulmonary hypertension (HCC)    Pulmonary nodules    followed by Dr Raul Del   Pure hypercholesterolemia    Renal cyst    right    SURGICAL HISTORY: Past Surgical History:  Procedure Laterality Date   ABDOMINAL HYSTERECTOMY     ovaries left in place   APPENDECTOMY     Back Surgeries     BACK SURGERY     BREAST REDUCTION SURGERY     3/99   CARDIAC CATHETERIZATION     cataracts Bilateral    CERVICAL SPINE SURGERY     ESOPHAGEAL MANOMETRY N/A 08/02/2015   Procedure: ESOPHAGEAL MANOMETRY (EM);  Surgeon: Josefine Class, MD;  Location: [facility] ENDOSCOPY;  Service: Endoscopy;  Laterality: N/A;   ESOPHAGOGASTRODUODENOSCOPY N/A 02/27/2015   Procedure: ESOPHAGOGASTRODUODENOSCOPY (EGD);  Surgeon: Hulen Luster, MD;  Location: [facility] ENDOSCOPY;  Service: Gastroenterology;  Laterality: N/A;   ESOPHAGOGASTRODUODENOSCOPY (EGD) WITH PROPOFOL  N/A 10/30/2018   Procedure: ESOPHAGOGASTRODUODENOSCOPY (EGD) WITH PROPOFOL;  Surgeon: Lollie Sails, MD;  Location: [facility] ENDOSCOPY;  Service: Endoscopy;  Laterality: N/A;   EXCISIONAL HEMORRHOIDECTOMY     EYE SURGERY     FRACTURE SURGERY     HEMORRHOID SURGERY     HIP SURGERY  2013   Right hip surgery   JOINT REPLACEMENT     KNEE ARTHROSCOPY     left and right   ORIF FEMUR FRACTURE Right 08/07/2018   Procedure: OPEN REDUCTION INTERNAL FIXATION  (ORIF) DISTAL FEMUR FRACTURE;  Surgeon: Hessie Knows, MD;  Location: [facility] ORS;  Service: Orthopedics;  Laterality: Right;   REDUCTION MAMMAPLASTY Bilateral YRS AGO   REPLACEMENT TOTAL KNEE Bilateral    rotator cuff surgery     blilateral   TONSILECTOMY/ADENOIDECTOMY WITH MYRINGOTOMY     VISCERAL ARTERY INTERVENTION N/A 02/25/2019   Procedure: VISCERAL ARTERY INTERVENTION;  Surgeon: Algernon Huxley, MD;  Location: [place] [facility];  Service: Cardiovascular;  Laterality: N/A;    SOCIAL HISTORY: Social History   Socioeconomic History   Marital status: Widowed    Spouse name: Not on file   Number of children: 2   Years of education: 20   Highest education level: 12th grade  Occupational History    Comment: insurance agency  Tobacco Use   Smoking status: Never   Smokeless tobacco: Never  Vaping Use   Vaping Use: Never used  Substance and Sexual Activity   Alcohol use: No    Alcohol/week: 0.0 standard drinks   Drug use: Never   Sexual activity: Not Currently  Other Topics Concern   Not on file  Social History Narrative   Not on file   Social Determinants of Health   Financial Resource Strain: Low Risk    Difficulty of Paying Living Expenses: Not hard at all  Food Insecurity: No Food Insecurity   Worried About Charity fundraiser in the Last Year: Never true   [place] in the Last Year: Never true  Transportation Needs: No Transportation Needs   Lack of Transportation (Medical): No   Lack of Transportation (Non-Medical): No  Physical Activity: Inactive   Days of Exercise per Week: 0 days   Minutes of Exercise per Session: 0 min  Stress: No Stress Concern Present   Feeling of Stress : Only a little  Social Connections: Socially Isolated   Frequency of Communication with Friends and Family: Three times a week   Frequency of Social Gatherings with Friends and Family: Twice a week   Attends Religious Services: Never   Marine scientist or Organizations: No    Attends Archivist Meetings: Never   Marital Status: Widowed  Human resources officer Violence: Not At Risk   Fear of Current or Ex-Partner: No   Emotionally Abused: No   Physically Abused: No   Sexually Abused: No    FAMILY HISTORY: Family History  Problem Relation Age of Onset   Heart disease Mother    Stroke Mother    Hypertension Mother    Heart disease Father        myocardial infarction age 11   Breast cancer Neg Hx     ALLERGIES:  is allergic to lyrica [pregabalin], omnicef [cefdinir], atarax [hydroxyzine], dicyclomine, hydroxyzine hcl, levaquin [levofloxacin], nitrofurantoin, nucynta er [tapentadol hcl er], oxybutynin, zoloft [sertraline hcl], biaxin [clarithromycin], sertraline, sulfa antibiotics, sulfasalazine, tape, and tapentadol.  MEDICATIONS:  Current Outpatient Medications  Medication Sig Dispense Refill  albuterol (VENTOLIN HFA) 108 (90 Base) MCG/ACT inhaler INHALE 2 PUFFS INTO THE LUNGS EVERY 6 (SIX) HOURS AS NEEDED FOR WHEEZING OR SHORTNESS OF BREATH 18 g 0   aluminum-magnesium hydroxide-simethicone (MAALOX) 945-859-29 MG/5ML SUSP Take 15 mLs by mouth 4 (four) times daily -  before meals and at bedtime.      amLODipine (NORVASC) 10 MG tablet TAKE 1 TABLET BY MOUTH DAILY 90 tablet 1   aspirin 81 MG EC tablet Take by mouth.     atorvastatin (LIPITOR) 20 MG tablet TAKE 1 TABLET BY MOUTH AT BEDTIME. 90 tablet 1   Azelastine HCl 137 MCG/SPRAY SOLN PLACE 1 SPRAY INTO BOTH NOSTRILS TWO TIMES DAILY 30 mL 1   benazepril (LOTENSIN) 40 MG tablet TAKE 1 TABLET BY MOUTH DAILY 90 tablet 1   betamethasone dipropionate 0.05 % cream Apply 1 application topically 2 (two) times daily.     budesonide-formoterol (SYMBICORT) 80-4.5 MCG/ACT inhaler Inhale 2 puffs into the lungs 2 (two) times daily. 1 each 12   busPIRone (BUSPAR) 10 MG tablet Take 10 mg by mouth 2 (two) times daily.      calcium citrate-vitamin D (CITRACAL+D) 315-200 MG-UNIT tablet Take 1 tablet by mouth daily.      Cholecalciferol (VITAMIN D3) 1000 UNITS CAPS Take 1 capsule by mouth daily.     clobetasol cream (TEMOVATE) 2.44 % Apply 1 application topically See admin instructions. Apply to affected areas of body 1 - 2 times daily as needed for itchy bumps. Avoid applying to face, groin, and axilla. Use as directed. 60 g 1   Diclofenac Sodium 3 % GEL Apply topically 2 (two) times daily.     ELMIRON 100 MG capsule Take 100 mg by mouth 3 (three) times daily.     fluocinonide (LIDEX) 0.05 % external solution Apply topically 2 (two) times daily. Apply drops to scalp for itch 60 mL 3   furosemide (LASIX) 20 MG tablet TAKE 1 TABLET BY MOUTH DAILY AS NEEDED 90 tablet 0   gabapentin (NEURONTIN) 300 MG capsule Take two tablets tid (in am, midday and q hs). 180 capsule 2   HYDROcodone-acetaminophen (NORCO) 10-325 MG tablet Take 1 tablet by mouth 3 (three) times daily. 10 tablet 0   ketoconazole (NIZORAL) 2 % shampoo Apply 1 application topically See admin instructions. apply three times per week, massage into scalp and leave in for 10 minutes before rinsing out 120 mL 3   magnesium oxide (MAG-OX) 400 MG tablet Take 1 tablet (400 mg total) by mouth daily. 30 tablet 1   Melatonin 10 MG CAPS Take 1 capsule by mouth.     methocarbamol (ROBAXIN) 500 MG tablet Take 500 mg by mouth 3 (three) times daily. As needed     metoprolol succinate (TOPROL-XL) 50 MG 24 hr tablet Take 1 tablet (50 mg total) by mouth daily. Take with or immediately following a meal. 90 tablet 1   montelukast (SINGULAIR) 10 MG tablet 10 mg once daily as needed     morphine (MS CONTIN) 60 MG 12 hr tablet Take 1 tablet (60 mg total) by mouth 2 (two) times daily. 10 tablet 0   mupirocin ointment (BACTROBAN) 2 % Apply 1 application topically daily. Apply to left knee until healed 22 g 0   NYAMYC powder Apply topically.     polyethylene glycol powder (GLYCOLAX/MIRALAX) 17 GM/SCOOP powder MIX 17 GRAMS AS MARKED ON BOTTLE TOP IN 8 OUNCES OF WATER AND DRINK ONCE A  DAY AS DIRECTED. 527 g 0  RABEprazole (ACIPHEX) 20 MG tablet TAKE 1 TABLET BY MOUTH TWICE A DAY BEFORE A MEAL 180 tablet 1   senna (SENOKOT) 8.6 MG TABS tablet Take 1 tablet by mouth 2 (two) times daily.     Simethicone (GAS-X PO) Take 2 tablets by mouth 2 (two) times daily.     sodium chloride (OCEAN) 0.65 % nasal spray Place 1 spray into the nose as needed.     SYNTHROID 100 MCG tablet Take 1 tablet (100 mcg total) by mouth daily. 90 tablet 3   triamcinolone ointment (KENALOG) 0.1 % APPLY TWICE DAILY TO BITES AND RASH UNTIL FLAT AND SMOOTH **DO NOT APPLY TO FACE** 80 g 0   Docusate Sodium (DSS) 100 MG CAPS Take by mouth.     DULoxetine (CYMBALTA) 60 MG capsule TAKE 1 CAPSULE BY MOUTH EVERY DAY. (Patient not taking: Reported on 09/26/2021) 30 capsule 3   mupirocin ointment (BACTROBAN) 2 % Apply 1 application topically 2 (two) times daily. 22 g 0   Naldemedine Tosylate 0.2 MG TABS Take 0.2 mg by mouth daily.     No current facility-administered medications for this visit.      Marland Kitchen  PHYSICAL EXAMINATION: ECOG PERFORMANCE STATUS: 0 - Asymptomatic  Vitals:   09/26/21 1337  BP: (!) 149/64  Pulse: (!) 57  Temp: 98 F (36.7 C)  SpO2: 100%   Filed Weights   09/26/21 1337  Weight: 178 lb (80.7 kg)    Physical Exam Constitutional:      Comments: Patient in wheelchair because of chronic arthritic problems.  HENT:     Head: Normocephalic and atraumatic.     Mouth/Throat:     Pharynx: No oropharyngeal exudate.  Eyes:     Pupils: Pupils are equal, round, and reactive to light.  Cardiovascular:     Rate and Rhythm: Normal rate and regular rhythm.  Pulmonary:     Effort: Pulmonary effort is normal. No respiratory distress.     Breath sounds: Normal breath sounds. No wheezing.  Abdominal:     General: Bowel sounds are normal. There is no distension.     Palpations: Abdomen is soft. There is no mass.     Tenderness: There is no abdominal tenderness. There is no guarding or rebound.   Musculoskeletal:        General: No tenderness. Normal range of motion.     Cervical back: Normal range of motion and neck supple.  Skin:    General: Skin is warm.  Neurological:     Mental Status: She is alert and oriented to person, place, and time.  Psychiatric:        Mood and Affect: Affect normal.    LABORATORY DATA:  I have reviewed the data as listed Lab Results  Component Value Date   WBC 6.5 09/26/2021   HGB 10.8 (L) 09/26/2021   HCT 33.7 (L) 09/26/2021   MCV 102.7 (H) 09/26/2021   PLT 130 (L) 09/26/2021   Recent Labs    10/17/20 1253 10/31/20 0949 12/27/20 1256 01/24/21 1656 03/28/21 1405 05/14/21 1351 06/27/21 1236 09/26/21 1317  NA 140 140   < > 139 136 143 137 138  K 4.7 4.8   < > 4.2 4.3 4.4 4.4 3.9  CL 101 101   < > 98 101 100 98 99  CO2 34* 34*   < > 31 27 35* 33* 31  GLUCOSE 95 87   < > 96 122* 86 76 78  BUN 19 24*   < >  18 25* 16 24* 14  CREATININE 0.92 1.00   < > 0.84 1.00 0.77 0.93 0.69  CALCIUM 10.0 10.0   < > 9.9 9.2 10.2 9.3 9.7  GFRNONAA  --   --    < > >60 54*  --  59* >60  PROT 6.5 6.2  --  6.8  --  6.4 6.5  --   ALBUMIN 3.9 3.9  --  4.0  --  4.0 4.0  --   AST 17 15  --  20  --  17 20  --   ALT 11 10  --  13  --  11 15  --   ALKPHOS 91 83  --  86  --  78 72  --   BILITOT 0.5 0.5  --  0.6  --  0.4 0.7  --   BILIDIR 0.1 0.1  --   --   --  0.1  --   --    < > = values in this interval not displayed.    RADIOGRAPHIC STUDIES: I have personally reviewed the radiological images as listed and agreed with the findings in the report. No results found.  ASSESSMENT & PLAN:   Anemia due to blood loss, chronic #  Chronic anemia-unclear etiology-   Mild symptoms of ongoing fatigue.  Hemoglobin today is 10.3. April 2022-ferritin 42 saturation 25%.-given the clinical response to IV Venofer continue Venofer today.  See discussion below  #Etiology of macrocytic anemia is unclear: Again discussed with patient regarding possibility of low-grade  leukemia/MDS. wants to hold off bone marrow biopsy procedure.  Patient reluctant/might consider in 3 months.    # DISPOSITION:  # venofer today # follow up in 4 months- MD; labs-cbc/bmp;LDH--possible venofer-Dr.B     Cammie Sickle, MD 09/26/2021 3:04 PM

## 2021-09-26 NOTE — Assessment & Plan Note (Addendum)
#  Chronic anemia-unclear etiology-   Mild symptoms of ongoing fatigue.  Hemoglobin today is 10.3. April 2022-ferritin 42 saturation 25%.-given the clinical response to IV Venofer continue Venofer today.  See discussion below  #Etiology of macrocytic anemia is unclear: Again discussed with patient regarding possibility of low-grade leukemia/MDS. wants to hold off bone marrow biopsy procedure.  Patient reluctant/might consider in 3 months.    # DISPOSITION:  # venofer today # follow up in 4 months- MD; labs-cbc/bmp;LDH--possible venofer-Dr.B

## 2021-09-26 NOTE — Patient Instructions (Signed)
Iron Sucrose Injection What is this medication? IRON SUCROSE (EYE ern SOO krose) treats low levels of iron (iron deficiency anemia) in people with kidney disease. Iron is a mineral that plays an important role in making red blood cells, which carry oxygen from your lungs to the rest of your body. This medicine may be used for other purposes; ask your health care provider or pharmacist if you have questions. COMMON BRAND NAME(S): Venofer What should I tell my care team before I take this medication? They need to know if you have any of these conditions: Anemia not caused by low iron levels Heart disease High levels of iron in the blood Kidney disease Liver disease An unusual or allergic reaction to iron, other medications, foods, dyes, or preservatives Pregnant or trying to get pregnant Breast-feeding How should I use this medication? This medication is for infusion into a vein. It is given in a hospital or clinic setting. Talk to your care team about the use of this medication in children. While this medication may be prescribed for children as young as 2 years for selected conditions, precautions do apply. Overdosage: If you think you have taken too much of this medicine contact a poison control center or emergency room at once. NOTE: This medicine is only for you. Do not share this medicine with others. What if I miss a dose? It is important not to miss your dose. Call your care team if you are unable to keep an appointment. What may interact with this medication? Do not take this medication with any of the following: Deferoxamine Dimercaprol Other iron products This medication may also interact with the following: Chloramphenicol Deferasirox This list may not describe all possible interactions. Give your health care provider a list of all the medicines, herbs, non-prescription drugs, or dietary supplements you use. Also tell them if you smoke, drink alcohol, or use illegal drugs.  Some items may interact with your medicine. What should I watch for while using this medication? Visit your care team regularly. Tell your care team if your symptoms do not start to get better or if they get worse. You may need blood work done while you are taking this medication. You may need to follow a special diet. Talk to your care team. Foods that contain iron include: whole grains/cereals, dried fruits, beans, or peas, leafy green vegetables, and organ meats (liver, kidney). What side effects may I notice from receiving this medication? Side effects that you should report to your care team as soon as possible: Allergic reactions--skin rash, itching, hives, swelling of the face, lips, tongue, or throat Low blood pressure--dizziness, feeling faint or lightheaded, blurry vision Shortness of breath Side effects that usually do not require medical attention (report to your care team if they continue or are bothersome): Flushing Headache Joint pain Muscle pain Nausea Pain, redness, or irritation at injection site This list may not describe all possible side effects. Call your doctor for medical advice about side effects. You may report side effects to FDA at 1-800-FDA-1088. Where should I keep my medication? This medication is given in a hospital or clinic and will not be stored at home. NOTE: This sheet is a summary. It may not cover all possible information. If you have questions about this medicine, talk to your doctor, pharmacist, or health care provider.  2022 Elsevier/Gold Standard (2021-01-12 00:00:00)

## 2021-09-27 DIAGNOSIS — M25512 Pain in left shoulder: Secondary | ICD-10-CM | POA: Diagnosis not present

## 2021-09-27 DIAGNOSIS — M25511 Pain in right shoulder: Secondary | ICD-10-CM | POA: Diagnosis not present

## 2021-09-27 LAB — ERYTHROPOIETIN: Erythropoietin: 16.5 m[IU]/mL (ref 2.6–18.5)

## 2021-09-28 ENCOUNTER — Telehealth: Payer: Self-pay | Admitting: Internal Medicine

## 2021-09-28 NOTE — Telephone Encounter (Signed)
Pt need a med refill on gabapentin [facility]. Pt wants to know will she be able to get medicine this weekend.

## 2021-09-28 NOTE — Telephone Encounter (Signed)
Advised patient Gabapentin was refilled 08/02/21 with 2 refills that she should have refills at the pharmacy. Patient to call pharmacy for refill.

## 2021-09-28 NOTE — Telephone Encounter (Signed)
Pt called in wanting the cma to call her regarding her medication gabapentin.

## 2021-10-03 DIAGNOSIS — M1812 Unilateral primary osteoarthritis of first carpometacarpal joint, left hand: Secondary | ICD-10-CM | POA: Diagnosis not present

## 2021-10-03 DIAGNOSIS — M1811 Unilateral primary osteoarthritis of first carpometacarpal joint, right hand: Secondary | ICD-10-CM | POA: Diagnosis not present

## 2021-10-03 DIAGNOSIS — K551 Chronic vascular disorders of intestine: Secondary | ICD-10-CM | POA: Diagnosis not present

## 2021-10-09 ENCOUNTER — Other Ambulatory Visit: Payer: Self-pay | Admitting: Internal Medicine

## 2021-10-09 ENCOUNTER — Ambulatory Visit: Payer: Medicare Other | Admitting: Dermatology

## 2021-10-09 ENCOUNTER — Ambulatory Visit (INDEPENDENT_AMBULATORY_CARE_PROVIDER_SITE_OTHER): Payer: Medicare Other | Admitting: Internal Medicine

## 2021-10-09 ENCOUNTER — Other Ambulatory Visit: Payer: Self-pay

## 2021-10-09 VITALS — BP 132/76 | HR 71 | Temp 97.9°F | Resp 18 | Ht 62.0 in | Wt 177.0 lb

## 2021-10-09 DIAGNOSIS — R0602 Shortness of breath: Secondary | ICD-10-CM

## 2021-10-09 DIAGNOSIS — I272 Pulmonary hypertension, unspecified: Secondary | ICD-10-CM

## 2021-10-09 DIAGNOSIS — K219 Gastro-esophageal reflux disease without esophagitis: Secondary | ICD-10-CM

## 2021-10-09 DIAGNOSIS — L011 Impetiginization of other dermatoses: Secondary | ICD-10-CM | POA: Diagnosis not present

## 2021-10-09 DIAGNOSIS — L219 Seborrheic dermatitis, unspecified: Secondary | ICD-10-CM | POA: Diagnosis not present

## 2021-10-09 DIAGNOSIS — I872 Venous insufficiency (chronic) (peripheral): Secondary | ICD-10-CM

## 2021-10-09 DIAGNOSIS — I10 Essential (primary) hypertension: Secondary | ICD-10-CM

## 2021-10-09 DIAGNOSIS — G629 Polyneuropathy, unspecified: Secondary | ICD-10-CM | POA: Diagnosis not present

## 2021-10-09 DIAGNOSIS — E033 Postinfectious hypothyroidism: Secondary | ICD-10-CM

## 2021-10-09 DIAGNOSIS — E785 Hyperlipidemia, unspecified: Secondary | ICD-10-CM

## 2021-10-09 DIAGNOSIS — S00411D Abrasion of right ear, subsequent encounter: Secondary | ICD-10-CM | POA: Diagnosis not present

## 2021-10-09 DIAGNOSIS — I700 Atherosclerosis of aorta: Secondary | ICD-10-CM | POA: Diagnosis not present

## 2021-10-09 DIAGNOSIS — J9611 Chronic respiratory failure with hypoxia: Secondary | ICD-10-CM

## 2021-10-09 DIAGNOSIS — T148XXA Other injury of unspecified body region, initial encounter: Secondary | ICD-10-CM

## 2021-10-09 DIAGNOSIS — F439 Reaction to severe stress, unspecified: Secondary | ICD-10-CM

## 2021-10-09 DIAGNOSIS — I509 Heart failure, unspecified: Secondary | ICD-10-CM | POA: Diagnosis not present

## 2021-10-09 DIAGNOSIS — E875 Hyperkalemia: Secondary | ICD-10-CM

## 2021-10-09 LAB — BASIC METABOLIC PANEL
BUN: 23 mg/dL (ref 6–23)
CO2: 37 mEq/L — ABNORMAL HIGH (ref 19–32)
Calcium: 9.7 mg/dL (ref 8.4–10.5)
Chloride: 101 mEq/L (ref 96–112)
Creatinine, Ser: 0.97 mg/dL (ref 0.40–1.20)
GFR: 52.14 mL/min — ABNORMAL LOW (ref >60.00)
Glucose, Bld: 73 mg/dL (ref 70–99)
Potassium: 5.3 mEq/L — ABNORMAL HIGH (ref 3.5–5.1)
Sodium: 141 mEq/L (ref 135–145)

## 2021-10-09 LAB — TSH: TSH: 2.27 u[IU]/mL (ref 0.35–5.50)

## 2021-10-09 MED ORDER — FLUOCINOLONE ACETONIDE 0.01 % OT OIL: TOPICAL_OIL | OTIC | 3 refills | Status: DC

## 2021-10-09 NOTE — Progress Notes (Signed)
Order placed for f/u potassium.

## 2021-10-09 NOTE — Progress Notes (Signed)
Follow-Up Visit   Subjective  Kimberly Roy is a 88 y.o. female who presents for the following: Follow-up (Excoriation with secondary impetiginization of the right ear, much improved. She finished doxycycline x 1 week and used mupirocin ointment and Zoryve cream. She still has some trouble with ear when she wears hearing aid. ). It is itchy and irritated in the canal.  Sores have healed up.  Patient here with son and niece.   The following portions of the chart were reviewed this encounter and updated as appropriate:       Review of Systems:  No other skin or systemic complaints except as noted in HPI or Assessment and Plan.  Objective  Well appearing patient in no apparent distress; mood and affect are within normal limits.  A focused examination was performed including face, ear. Relevant physical exam findings are noted in the Assessment and Plan.  Right Ear Clear today.   bil ears Mild erythema and scale of the bil ear canal and scalp.    Assessment & Plan  Excoriation Right Ear  With secondary impetiginization - improved and clear post oral doxy and topical mupirocin.   May d/c mupirocin ointment. Observation.   Seborrheic dermatitis bil ears  Chronic condition with duration or expected duration over one year. Condition is bothersome to patient. Not currently at goal.   Start fluocinolone oil Apply 1-2 gtts to ears QD/BID prn flares dsp 14m 2Rf.  Continue ketoconazole 2% shampoo - massage into scalp and let sit several minutes before rinsing.   Continue fluocinonide solution Apply QD/BID AA scalp prn. Avoid ear.   Seborrheic Dermatitis  -  is a chronic persistent rash characterized by pinkness and scaling most commonly of the mid face but also can occur on the scalp (dandruff), ears; mid chest, mid back and groin.  It tends to be exacerbated by stress and cooler weather.  People who have neurologic disease may experience new onset or exacerbation of existing  seborrheic dermatitis.  The condition is not curable but treatable and can be controlled.  Fluocinolone Acetonide 0.01 % OIL - bil ears Apply 1-2 drops into ears once to twice daily as needed.  Related Medications ketoconazole (NIZORAL) 2 % shampoo Apply 1 application topically See admin instructions. apply three times per week, massage into scalp and leave in for 10 minutes before rinsing out  fluocinonide (LIDEX) 0.05 % external solution Apply topically 2 (two) times daily. Apply drops to scalp for itch   Return if symptoms worsen or fail to improve.  I,Jamesetta Orleans CMA, am acting as scribe for TBrendolyn Patty MD . Documentation: I have reviewed the above documentation for accuracy and completeness, and I agree with the above.  TBrendolyn PattyMD

## 2021-10-09 NOTE — Progress Notes (Signed)
Patient ID: Kimberly Roy, female   DOB: Dec 27, 1930, 88 y.o.   MRN: [id number]   Subjective:    Patient ID: Kimberly Roy, female    DOB: [birth year], 88 y.o.   MRN: [id number]  This visit occurred during the SARS-CoV-2 public health emergency.  Safety protocols were in place, including screening questions prior to the visit, additional usage of staff PPE, and extensive cleaning of exam room while observing appropriate contact time as indicated for disinfecting solutions.   Patient here for a scheduled follow up.   Chief Complaint  Patient presents with   Extremity Weakness   .   HPI She is accompanied by her caretaker Jeani Hawking).  History obtained from both of them.  Reports problems with her "hands slipping off her walker".  She has had problems with CTS.  Also just evaluated by ortho 10/03/21 for bilateral severe CMC arthritis.  Also, she has issues with her neck, shoulders and upper back.  Significant kyphosis.  Feel this is contributing to her symptoms and issues as well.  Increased fatigue.  Does report increased problems with her breathing when she is bending over and trying to get her compression hose on.  Appears to be more of a restrictive problem.   Breathing stable when up ambulating.  No increased sob.  Seeing Dr Rogue Bussing for anemia - Question of MDS.  Last hgb 10.3.  eating.  No abdominal pain.  Discussed need for help in home.  Has a caretaker.  Discussed PT/OT.  Has not felt this has helped.  She is interested in social work consult to see what resources are available.     Past Medical History:  Diagnosis Date   Anemia    Anxiety    Chest pain    CHF (congestive heart failure) (HCC)    Constipation    DDD (degenerative disc disease), cervical    Depression    DVT (deep venous thrombosis) (HCC)    Dysphonia    Dyspnea    Fatty liver    Fatty liver    Headache    Hyperlipidemia    Hyperpiesia    Hypertension    Hypothyroidism    Interstitial cystitis    Left ventricular  dysfunction    Lymphedema    Nephrolithiasis    Obstructive sleep apnea    Osteoarthritis    knees/cervical and lumbar spine   Pulmonary hypertension (HCC)    Pulmonary nodules    followed by Dr Raul Del   Pure hypercholesterolemia    Renal cyst    right   Past Surgical History:  Procedure Laterality Date   ABDOMINAL HYSTERECTOMY     ovaries left in place   APPENDECTOMY     Back Surgeries     BACK SURGERY     BREAST REDUCTION SURGERY     3/99   CARDIAC CATHETERIZATION     cataracts Bilateral    CERVICAL SPINE SURGERY     ESOPHAGEAL MANOMETRY N/A 08/02/2015   Procedure: ESOPHAGEAL MANOMETRY (EM);  Surgeon: Josefine Class, MD;  Location: [facility] ENDOSCOPY;  Service: Endoscopy;  Laterality: N/A;   ESOPHAGOGASTRODUODENOSCOPY N/A 02/27/2015   Procedure: ESOPHAGOGASTRODUODENOSCOPY (EGD);  Surgeon: Hulen Luster, MD;  Location: [facility] ENDOSCOPY;  Service: Gastroenterology;  Laterality: N/A;   ESOPHAGOGASTRODUODENOSCOPY (EGD) WITH PROPOFOL N/A 10/30/2018   Procedure: ESOPHAGOGASTRODUODENOSCOPY (EGD) WITH PROPOFOL;  Surgeon: Lollie Sails, MD;  Location: [facility] ENDOSCOPY;  Service: Endoscopy;  Laterality: N/A;   EXCISIONAL HEMORRHOIDECTOMY     EYE SURGERY  FRACTURE SURGERY     HEMORRHOID SURGERY     HIP SURGERY  2013   Right hip surgery   JOINT REPLACEMENT     KNEE ARTHROSCOPY     left and right   ORIF FEMUR FRACTURE Right 08/07/2018   Procedure: OPEN REDUCTION INTERNAL FIXATION (ORIF) DISTAL FEMUR FRACTURE;  Surgeon: Hessie Knows, MD;  Location: [facility] ORS;  Service: Orthopedics;  Laterality: Right;   REDUCTION MAMMAPLASTY Bilateral YRS AGO   REPLACEMENT TOTAL KNEE Bilateral    rotator cuff surgery     blilateral   TONSILECTOMY/ADENOIDECTOMY WITH MYRINGOTOMY     VISCERAL ARTERY INTERVENTION N/A 02/25/2019   Procedure: VISCERAL ARTERY INTERVENTION;  Surgeon: Algernon Huxley, MD;  Location: [facility] CV LAB;  Service: Cardiovascular;  Laterality: N/A;   Family History  Problem  Relation Age of Onset   Heart disease Mother    Stroke Mother    Hypertension Mother    Heart disease Father        myocardial infarction age 52   Breast cancer Neg Hx    Social History   Socioeconomic History   Marital status: Widowed    Spouse name: Not on file   Number of children: 2   Years of education: 93   Highest education level: 12th grade  Occupational History    Comment: insurance agency  Tobacco Use   Smoking status: Never   Smokeless tobacco: Never  Vaping Use   Vaping Use: Never used  Substance and Sexual Activity   Alcohol use: No    Alcohol/week: 0.0 standard drinks   Drug use: Never   Sexual activity: Not Currently  Other Topics Concern   Not on file  Social History Narrative   Not on file   Social Determinants of Health   Financial Resource Strain: Low Risk    Difficulty of Paying Living Expenses: Not hard at all  Food Insecurity: No Food Insecurity   Worried About Charity fundraiser in the Last Year: Never true   [place] in the Last Year: Never true  Transportation Needs: No Transportation Needs   Lack of Transportation (Medical): No   Lack of Transportation (Non-Medical): No  Physical Activity: Inactive   Days of Exercise per Week: 0 days   Minutes of Exercise per Session: 0 min  Stress: No Stress Concern Present   Feeling of Stress : Only a little  Social Connections: Socially Isolated   Frequency of Communication with Friends and Family: Three times a week   Frequency of Social Gatherings with Friends and Family: Twice a week   Attends Religious Services: Never   Marine scientist or Organizations: No   Attends Archivist Meetings: Never   Marital Status: Widowed     Review of Systems  Constitutional:  Negative for appetite change and unexpected weight change.  HENT:  Negative for congestion and sinus pressure.   Respiratory:  Negative for cough and chest tightness.        Breathing stable with ambulation.    Cardiovascular:  Negative for chest pain and palpitations.       No increased swelling.  Has done well with compression hose.    Gastrointestinal:  Negative for abdominal pain, diarrhea, nausea and vomiting.  Genitourinary:  Negative for difficulty urinating and dysuria.  Musculoskeletal:  Negative for myalgias.       Neck, shoulder and upper back issues as outlined. Hand - weakness as outlined.    Skin:  Negative for color change and rash.  Neurological:  Negative for dizziness, light-headedness and headaches.  Psychiatric/Behavioral:  Negative for agitation and dysphoric mood.       Objective:     BP 132/76    Pulse 71    Temp 97.9 F (36.6 C)    Resp 18    Ht _0  (1.575 m)    Wt 177 lb (80.3 kg)    SpO2 96%    BMI 32.37 kg/m  Wt Readings from Last 3 Encounters:  10/09/21 177 lb (80.3 kg)  09/26/21 178 lb (80.7 kg)  07/19/21 176 lb 9.6 oz (80.1 kg)    Physical Exam Vitals reviewed.  Constitutional:      General: She is not in acute distress.    Appearance: Normal appearance.  HENT:     Head: Normocephalic and atraumatic.     Right Ear: External ear normal.     Left Ear: External ear normal.  Eyes:     General: No scleral icterus.       Right eye: No discharge.        Left eye: No discharge.     Conjunctiva/sclera: Conjunctivae normal.  Neck:     Thyroid: No thyromegaly.  Cardiovascular:     Rate and Rhythm: Normal rate and regular rhythm.  Pulmonary:     Effort: No respiratory distress.     Breath sounds: Normal breath sounds. No wheezing.  Abdominal:     General: Bowel sounds are normal.     Palpations: Abdomen is soft.     Tenderness: There is no abdominal tenderness.  Musculoskeletal:        General: No tenderness.     Cervical back: Neck supple. No tenderness.     Comments: No increased swelling. Grip strength appears to be normal and equal bilateral hands.   Lymphadenopathy:     Cervical: No cervical adenopathy.  Skin:    Findings: No erythema or  rash.  Neurological:     Mental Status: She is alert.  Psychiatric:        Mood and Affect: Mood normal.        Behavior: Behavior normal.     Outpatient Encounter Medications as of 10/09/2021  Medication Sig   albuterol (VENTOLIN HFA) 108 (90 Base) MCG/ACT inhaler INHALE 2 PUFFS INTO THE LUNGS EVERY 6 (SIX) HOURS AS NEEDED FOR WHEEZING OR SHORTNESS OF BREATH   aluminum-magnesium hydroxide-simethicone (MAALOX) 200-200-20 MG/5ML SUSP Take 15 mLs by mouth 4 (four) times daily -  before meals and at bedtime.    amLODipine (NORVASC) 10 MG tablet TAKE 1 TABLET BY MOUTH DAILY   aspirin 81 MG EC tablet Take by mouth.   atorvastatin (LIPITOR) 20 MG tablet TAKE 1 TABLET BY MOUTH AT BEDTIME.   Azelastine HCl 137 MCG/SPRAY SOLN PLACE 1 SPRAY INTO BOTH NOSTRILS TWO TIMES DAILY   benazepril (LOTENSIN) 40 MG tablet TAKE 1 TABLET BY MOUTH DAILY   betamethasone dipropionate 0.05 % cream Apply 1 application topically 2 (two) times daily.   budesonide-formoterol (SYMBICORT) 80-4.5 MCG/ACT inhaler Inhale 2 puffs into the lungs 2 (two) times daily.   busPIRone (BUSPAR) 10 MG tablet Take 10 mg by mouth 2 (two) times daily.    calcium citrate-vitamin D (CITRACAL+D) 315-200 MG-UNIT tablet Take 1 tablet by mouth daily.   Cholecalciferol (VITAMIN D3) 1000 UNITS CAPS Take 1 capsule by mouth daily.   clobetasol cream (TEMOVATE) 0.38 % Apply 1 application topically See admin instructions. Apply to affected areas  of body 1 - 2 times daily as needed for itchy bumps. Avoid applying to face, groin, and axilla. Use as directed.   Diclofenac Sodium 3 % GEL Apply topically 2 (two) times daily.   Docusate Sodium (DSS) 100 MG CAPS Take by mouth.   ELMIRON 100 MG capsule Take 100 mg by mouth 3 (three) times daily.   fluocinonide (LIDEX) 0.05 % external solution Apply topically 2 (two) times daily. Apply drops to scalp for itch   furosemide (LASIX) 20 MG tablet TAKE 1 TABLET BY MOUTH DAILY AS NEEDED   gabapentin (NEURONTIN) 300  MG capsule Take two tablets tid (in am, midday and q hs).   HYDROcodone-acetaminophen (NORCO) 10-325 MG tablet Take 1 tablet by mouth 3 (three) times daily.   ketoconazole (NIZORAL) 2 % shampoo Apply 1 application topically See admin instructions. apply three times per week, massage into scalp and leave in for 10 minutes before rinsing out   magnesium oxide (MAG-OX) 400 MG tablet Take 1 tablet (400 mg total) by mouth daily.   Melatonin 10 MG CAPS Take 1 capsule by mouth.   methocarbamol (ROBAXIN) 500 MG tablet Take 500 mg by mouth 3 (three) times daily. As needed   metoprolol succinate (TOPROL-XL) 50 MG 24 hr tablet Take 1 tablet (50 mg total) by mouth daily. Take with or immediately following a meal.   montelukast (SINGULAIR) 10 MG tablet 10 mg once daily as needed   morphine (MS CONTIN) 60 MG 12 hr tablet Take 1 tablet (60 mg total) by mouth 2 (two) times daily.   mupirocin ointment (BACTROBAN) 2 % Apply 1 application topically 2 (two) times daily.   mupirocin ointment (BACTROBAN) 2 % Apply 1 application topically daily. Apply to left knee until healed   Naldemedine Tosylate 0.2 MG TABS Take 0.2 mg by mouth daily.   NYAMYC powder Apply topically.   polyethylene glycol powder (GLYCOLAX/MIRALAX) 17 GM/SCOOP powder MIX 17 GRAMS AS MARKED ON BOTTLE TOP IN 8 OUNCES OF WATER AND DRINK ONCE A DAY AS DIRECTED.   RABEprazole (ACIPHEX) 20 MG tablet TAKE 1 TABLET BY MOUTH TWICE A DAY BEFORE A MEAL   senna (SENOKOT) 8.6 MG TABS tablet Take 1 tablet by mouth 2 (two) times daily.   Simethicone (GAS-X PO) Take 2 tablets by mouth 2 (two) times daily.   sodium chloride (OCEAN) 0.65 % nasal spray Place 1 spray into the nose as needed.   SYNTHROID 100 MCG tablet Take 1 tablet (100 mcg total) by mouth daily.   triamcinolone ointment (KENALOG) 0.1 % APPLY TWICE DAILY TO BITES AND RASH UNTIL FLAT AND SMOOTH **DO NOT APPLY TO FACE**   [DISCONTINUED] DULoxetine (CYMBALTA) 60 MG capsule TAKE 1 CAPSULE BY MOUTH EVERY  DAY. (Patient not taking: Reported on 09/26/2021)   No facility-administered encounter medications on file as of 10/09/2021.     Lab Results  Component Value Date   WBC 6.5 09/26/2021   HGB 10.8 (L) 09/26/2021   HCT 33.7 (L) 09/26/2021   PLT 130 (L) 09/26/2021   GLUCOSE 73 10/09/2021   CHOL 128 05/14/2021   TRIG 123.0 05/14/2021   HDL 44.30 05/14/2021   LDLCALC 60 05/14/2021   ALT 15 06/27/2021   AST 20 06/27/2021   NA 141 10/09/2021   K 4.7 10/10/2021   CL 101 10/09/2021   CREATININE 0.97 10/09/2021   BUN 23 10/09/2021   CO2 37 (H) 10/09/2021   TSH 2.27 10/09/2021   INR 0.89 08/06/2018   HGBA1C 6.0 10/19/2014  CT ABDOMEN PELVIS W CONTRAST  Result Date: 06/18/2021 CLINICAL DATA:  Abdominal pain for several months EXAM: CT ABDOMEN AND PELVIS WITH CONTRAST TECHNIQUE: Multidetector CT imaging of the abdomen and pelvis was performed using the standard protocol following bolus administration of intravenous contrast. CONTRAST:  43m OMNIPAQUE IOHEXOL 350 MG/ML SOLN COMPARISON:  CT Abdomen and Pelvis 11/16/2020. FINDINGS: Lower chest: Unchanged nodules in the included lung bases including a 6 mm left lower lobe nodule which is longstanding and considered benign. No pleural effusion. Mild cardiomegaly. Hepatobiliary: No focal liver abnormality is seen. Mild chronic intrahepatic biliary prominence is unchanged. There is no significant extrahepatic biliary dilatation, and the gallbladder is unremarkable. Pancreas: Unremarkable. Spleen: Unremarkable. Adrenals/Urinary Tract: Unchanged 1.5 cm right adrenal myelolipoma. Unremarkable left adrenal gland. Similar appearance of right renal cysts measuring up to 5.2 cm. Subcentimeter hypodensities in both kidneys, too small to fully characterize. Mild bilateral renal cortical thinning. No renal calculi or hydronephrosis. Unremarkable bladder. Stomach/Bowel: The stomach is unremarkable. There is no evidence of bowel obstruction or inflammation. Prior  appendectomy. Vascular/Lymphatic: Abdominal aortic atherosclerosis without aneurysm. No enlarged lymph nodes. Reproductive: Status post hysterectomy. No adnexal masses. Other: Unchanged small fat containing umbilical hernia. No ascites or pneumoperitoneum. Musculoskeletal: ORIF of the right femur. Prior posterior decompression and fusion in the lumbar spine. Partially visualized spinal stimulator extending towards the thoracic spine. Diffuse osteopenia. IMPRESSION: 1. No acute abnormality identified in the abdomen or pelvis. 2. Unchanged right adrenal myelolipoma. 3. Aortic Atherosclerosis (ICD10-I70.0). Electronically Signed   By: ALogan BoresM.D.   On: 06/18/2021 11:39       Assessment & Plan:   Problem List Items Addressed This Visit     Aortic atherosclerosis ([place]    Continue lipitor.       CHF (congestive heart failure) (HCC)    On lasix.  Lower extremity swelling improved.  No evidence of volume overload.  Follow.       Relevant Orders   [facility]   Chronic respiratory failure with hypoxia (HCC)    Using oxygen at night.  Breathing baseline.       Relevant Orders   [facility]   Chronic venous insufficiency    Continue compression hose.       Relevant Orders   [facility]   GERD (gastroesophageal reflux disease)    Continue aciphex.  Followed by GI.       Hyperlipidemia    On lipitor.  Low cholesterol diet and exercise.  Follow lipid panel and liver function tests.        Hypertension - Primary    Continue amlodipine, lasix and metoprolol. Blood pressure as outlined.  Follow pressures.  Follow metabolic panel.       Relevant Orders   TSH (Completed)   Basic metabolic panel (Completed)   Hypothyroidism    On thyroid replacement.  Follow tsh.       Neuropathy    Continues on gabapentin.  Follow.       Pulmonary hypertension ([place]    Followed by pulmonary.   Continue nighttime oxygen.       SOB (shortness of breath)    Occurs when bending over putting on compression hose.  Appears to be more restrictive.  Breathing stable when up and ambulating.  Discussed posture. Discussed help with compression hose.  She reports needs help in the home. Discussed possible palliative care.  She is agreeable to social work  referral to see what resources are available for her. Declines PT/OT at this time.        Relevant Orders   AMB Referral to [facility]   Stress    Followed by psychiatry.  Appears to be stable.  Follow.       Relevant Orders   AMB Referral to [facility] Coordinaton     Einar Pheasant, MD

## 2021-10-09 NOTE — Patient Instructions (Signed)
° ° °If You Need Anything After Your Visit ° °If you have any questions or concerns for your doctor, please call our main line at [phone] and press option 4 to reach your doctor's medical assistant. If no one answers, please leave a voicemail as directed and we will return your call as soon as possible. Messages left after 4 pm will be answered the following business day.  ° °You may also send us a message via MyChart. We typically respond to MyChart messages within 1-2 business days. ° °For prescription refills, please ask your pharmacy to contact our office. Our fax number is [phone]. ° °If you have an urgent issue when the clinic is closed that cannot wait until the next business day, you can page your doctor at the number below.   ° °Please note that while we do our best to be available for urgent issues outside of office hours, we are not available 24/7.  ° °If you have an urgent issue and are unable to reach us, you may choose to seek medical care at your doctor's office, retail clinic, urgent care center, or emergency room. ° °If you have a medical emergency, please immediately call 911 or go to the emergency department. ° °Pager Numbers ° °- Dr. Kowalski: [phone] ° °- Dr. Moye: [phone] ° °- Dr. Stewart: [phone] ° °In the event of inclement weather, please call our main line at [phone] for an update on the status of any delays or closures. ° °Dermatology Medication Tips: °Please keep the boxes that topical medications come in in order to help keep track of the instructions about where and how to use these. Pharmacies typically print the medication instructions only on the boxes and not directly on the medication tubes.  ° °If your medication is too expensive, please contact our office at [phone] option 4 or send us a message through MyChart.  ° °We are unable to tell what your co-pay for medications will be in advance as this is different depending on your insurance  coverage. However, we may be able to find a substitute medication at lower cost or fill out paperwork to get insurance to cover a needed medication.  ° °If a prior authorization is required to get your medication covered by your insurance company, please allow us 1-2 business days to complete this process. ° °Drug prices often vary depending on where the prescription is filled and some pharmacies may offer cheaper prices. ° °The website www.goodrx.com contains coupons for medications through different pharmacies. The prices here do not account for what the cost may be with help from insurance (it may be cheaper with your insurance), but the website can give you the price if you did not use any insurance.  °- You can print the associated coupon and take it with your prescription to the pharmacy.  °- You may also stop by our office during regular business hours and pick up a GoodRx coupon card.  °- If you need your prescription sent electronically to a different pharmacy, notify our office through [place] MyChart or by phone at [phone] option 4. ° ° ° ° °Si Usted Necesita Algo Después de Su Visita ° °También puede enviarnos un mensaje a través de MyChart. Por lo general respondemos a los mensajes de MyChart en el transcurso de 1 a 2 días hábiles. ° °Para renovar recetas, por favor pida a su farmacia que se ponga en contacto con nuestra oficina. Nuestro número de fax es el [phone]. ° °  Si tiene un asunto urgente cuando la clínica esté cerrada y que no puede esperar hasta el siguiente día hábil, puede llamar/localizar a su doctor(a) al número que aparece a continuación.  ° °Por favor, tenga en cuenta que aunque hacemos todo lo posible para estar disponibles para asuntos urgentes fuera del horario de oficina, no estamos disponibles las 24 horas del día, los 7 días de la semana.  ° °Si tiene un problema urgente y no puede comunicarse con nosotros, puede optar por buscar atención médica  en el consultorio de  su doctor(a), en una clínica privada, en un centro de atención urgente o en una sala de emergencias. ° °Si tiene una emergencia médica, por favor llame inmediatamente al 911 o vaya a la sala de emergencias. ° °Números de bíper ° °- Dr. Kowalski: [phone] ° °- Dra. Moye: [phone] ° °- Dra. Stewart: [phone] ° °En caso de inclemencias del tiempo, por favor llame a nuestra línea principal al [phone] para una actualización sobre el estado de cualquier retraso o cierre. ° °Consejos para la medicación en dermatología: °Por favor, guarde las cajas en las que vienen los medicamentos de uso tópico para ayudarle a seguir las instrucciones sobre dónde y cómo usarlos. Las farmacias generalmente imprimen las instrucciones del medicamento sólo en las cajas y no directamente en los tubos del medicamento.  ° °Si su medicamento es muy caro, por favor, póngase en contacto con nuestra oficina llamando al [phone] y presione la opción 4 o envíenos un mensaje a través de MyChart.  ° °No podemos decirle cuál será su copago por los medicamentos por adelantado ya que esto es diferente dependiendo de la cobertura de su seguro. Sin embargo, es posible que podamos encontrar un medicamento sustituto a menor costo o llenar un formulario para que el seguro cubra el medicamento que se considera necesario.  ° °Si se requiere una autorización previa para que su compañía de seguros cubra su medicamento, por favor permítanos de 1 a 2 días hábiles para completar este proceso. ° °Los precios de los medicamentos varían con frecuencia dependiendo del lugar de dónde se surte la receta y alguna farmacias pueden ofrecer precios más baratos. ° °El sitio web www.goodrx.com tiene cupones para medicamentos de diferentes farmacias. Los precios aquí no tienen en cuenta lo que podría costar con la ayuda del seguro (puede ser más barato con su seguro), pero el sitio web puede darle el precio si no utilizó ningún seguro.  °- Puede imprimir el  cupón correspondiente y llevarlo con su receta a la farmacia.  °- También puede pasar por nuestra oficina durante el horario de atención regular y recoger una tarjeta de cupones de GoodRx.  °- Si necesita que su receta se envíe electrónicamente a una farmacia diferente, informe a nuestra oficina a través de MyChart de [place] o por teléfono llamando al [phone] y presione la opción 4. ° °

## 2021-10-10 ENCOUNTER — Other Ambulatory Visit
Admission: RE | Admit: 2021-10-10 | Discharge: 2021-10-10 | Disposition: A | Discharge status: Home / Self Care | Payer: Medicare Other | Attending: Internal Medicine | Admitting: Internal Medicine

## 2021-10-10 DIAGNOSIS — E875 Hyperkalemia: Secondary | ICD-10-CM | POA: Diagnosis not present

## 2021-10-10 LAB — POTASSIUM: Potassium: 4.7 mmol/L (ref 3.5–5.1)

## 2021-10-14 ENCOUNTER — Encounter: Payer: Self-pay | Admitting: Internal Medicine

## 2021-10-14 DIAGNOSIS — R0602 Shortness of breath: Secondary | ICD-10-CM | POA: Insufficient documentation

## 2021-10-14 NOTE — Assessment & Plan Note (Signed)
Continues on gabapentin.  Follow.  

## 2021-10-14 NOTE — Assessment & Plan Note (Signed)
Continue aciphex.  Followed by GI.  

## 2021-10-14 NOTE — Assessment & Plan Note (Signed)
Using oxygen at night.  Breathing baseline.  

## 2021-10-14 NOTE — Assessment & Plan Note (Signed)
On lasix.  Lower extremity swelling improved.  No evidence of volume overload.  Follow.  

## 2021-10-14 NOTE — Assessment & Plan Note (Signed)
Occurs when bending over putting on compression hose.  Appears to be more restrictive.  Breathing stable when up and ambulating.  Discussed posture. Discussed help with compression hose.  She reports needs help in the home. Discussed possible palliative care.  She is agreeable to social work referral to see what resources are available for her. Declines PT/OT at this time.

## 2021-10-14 NOTE — Assessment & Plan Note (Signed)
Followed by psychiatry.  Appears to be stable.  Follow.

## 2021-10-14 NOTE — Assessment & Plan Note (Signed)
Continue lipitor.

## 2021-10-14 NOTE — Assessment & Plan Note (Signed)
Followed by pulmonary.  Continue nighttime oxygen.  

## 2021-10-14 NOTE — Assessment & Plan Note (Signed)
Continue amlodipine, lasix and metoprolol. Blood pressure as outlined.  Follow pressures.  Follow metabolic panel.

## 2021-10-14 NOTE — Assessment & Plan Note (Signed)
Continue compression hose.

## 2021-10-14 NOTE — Assessment & Plan Note (Signed)
On lipitor.  Low cholesterol diet and exercise.  Follow lipid panel and liver function tests.   

## 2021-10-14 NOTE — Assessment & Plan Note (Signed)
On thyroid replacement.  Follow tsh.  

## 2021-10-19 ENCOUNTER — Ambulatory Visit (INDEPENDENT_AMBULATORY_CARE_PROVIDER_SITE_OTHER): Payer: Medicare Other | Admitting: *Deleted

## 2021-10-19 DIAGNOSIS — G8929 Other chronic pain: Secondary | ICD-10-CM

## 2021-10-19 DIAGNOSIS — S7291XD Unspecified fracture of right femur, subsequent encounter for closed fracture with routine healing: Secondary | ICD-10-CM

## 2021-10-19 DIAGNOSIS — I10 Essential (primary) hypertension: Secondary | ICD-10-CM

## 2021-10-19 DIAGNOSIS — M1990 Unspecified osteoarthritis, unspecified site: Secondary | ICD-10-CM

## 2021-10-19 DIAGNOSIS — M19011 Primary osteoarthritis, right shoulder: Secondary | ICD-10-CM

## 2021-10-19 NOTE — Chronic Care Management (AMB) (Signed)
Chronic Care Management   CCM RN Visit Note  10/19/2021 Name: Kimberly Roy MRN: [id number] DOB: [birth year]  Subjective: Kimberly Roy is a 88 y.o. year old female who is a primary care patient of Einar Pheasant, MD. The care management team was consulted for assistance with disease management and care coordination needs.    Engaged with patient by telephone for follow up visit in response to provider referral for case management and/or care coordination services.   Consent to Services:  The patient was given the following information about Chronic Care Management services today, agreed to services, and gave verbal consent: 1. CCM service includes personalized support from designated clinical staff supervised by the primary care provider, including individualized plan of care and coordination with other care providers 2. 24/7 contact phone numbers for assistance for urgent and routine care needs. 3. Service will only be billed when office clinical staff spend 20 minutes or more in a month to coordinate care. 4. Only one practitioner may furnish and bill the service in a calendar month. 5.The patient may stop CCM services at any time (effective at the end of the month) by phone call to the office staff. 6. The patient will be responsible for cost sharing (co-pay) of up to 20% of the service fee (after annual deductible is met). Patient agreed to services and consent obtained.  Patient agreed to services and verbal consent obtained.   Assessment: Review of patient past medical history, allergies, medications, health status, including review of consultants reports, laboratory and other test data, was performed as part of comprehensive evaluation and provision of chronic care management services.   SDOH (Social Determinants of Health) assessments and interventions performed:    CCM Care Plan  Allergies  Allergen Reactions   Lyrica [Pregabalin] Swelling   Omnicef [Cefdinir] Diarrhea and Nausea And  Vomiting   Atarax [Hydroxyzine]     jittery   Dicyclomine Other (See Comments)    Abdominal bloating    Hydroxyzine Hcl     jittery   Levaquin [Levofloxacin] Swelling   Nitrofurantoin Diarrhea   Nucynta Er [Tapentadol Hcl Er] Other (See Comments)    Severe constipation    Oxybutynin Other (See Comments)    Blurred vision   Zoloft [Sertraline Hcl]     Severe headache   Biaxin [Clarithromycin] Other (See Comments) and Rash    Pt does not remember Pt does not remember   Sertraline Nausea And Vomiting    Severe headache Severe headache Other reaction(s): Headache Severe headache   Sulfa Antibiotics Rash    Pt does not remember   Sulfasalazine Rash    Pt does not remember   Tape Rash    Durabond - redness   Tapentadol Other (See Comments) and Rash    _0     Outpatient Encounter Medications as of 10/19/2021  Medication Sig Note   albuterol (VENTOLIN HFA) 108 (90 Base) MCG/ACT inhaler INHALE 2 PUFFS INTO THE LUNGS EVERY 6 (SIX) HOURS AS NEEDED FOR WHEEZING OR SHORTNESS OF BREATH    aluminum-magnesium hydroxide-simethicone (MAALOX) 200-200-20 MG/5ML SUSP Take 15 mLs by mouth 4 (four) times daily -  before meals and at bedtime.  02/09/2021: Reports taking as needed   amLODipine (NORVASC) 10 MG tablet TAKE 1 TABLET BY MOUTH DAILY    aspirin 81 MG EC tablet Take by mouth.    atorvastatin (LIPITOR) 20 MG tablet TAKE 1 TABLET BY MOUTH AT BEDTIME.  Azelastine HCl 137 MCG/SPRAY SOLN PLACE 1 SPRAY INTO BOTH NOSTRILS TWO TIMES DAILY    benazepril (LOTENSIN) 40 MG tablet TAKE 1 TABLET BY MOUTH DAILY    betamethasone dipropionate 0.05 % cream Apply 1 application topically 2 (two) times daily. 02/09/2021: Reports uses as needed   budesonide-formoterol (SYMBICORT) 80-4.5 MCG/ACT inhaler Inhale 2 puffs into the lungs 2 (two) times daily. 02/09/2021: Reports not taking at this time   busPIRone  (BUSPAR) 10 MG tablet Take 10 mg by mouth 2 (two) times daily.     calcium citrate-vitamin D (CITRACAL+D) 315-200 MG-UNIT tablet Take 1 tablet by mouth daily.    Cholecalciferol (VITAMIN D3) 1000 UNITS CAPS Take 1 capsule by mouth daily.    clobetasol cream (TEMOVATE) 9.93 % Apply 1 application topically See admin instructions. Apply to affected areas of body 1 - 2 times daily as needed for itchy bumps. Avoid applying to face, groin, and axilla. Use as directed.    Diclofenac Sodium 3 % GEL Apply topically 2 (two) times daily. 02/09/2021: As needed   Docusate Sodium (DSS) 100 MG CAPS Take by mouth.    ELMIRON 100 MG capsule Take 100 mg by mouth 3 (three) times daily.    Fluocinolone Acetonide 0.01 % OIL Apply 1-2 drops into ears once to twice daily as needed.    fluocinonide (LIDEX) 0.05 % external solution Apply topically 2 (two) times daily. Apply drops to scalp for itch    furosemide (LASIX) 20 MG tablet TAKE 1 TABLET BY MOUTH DAILY AS NEEDED    gabapentin (NEURONTIN) 300 MG capsule Take two tablets tid (in am, midday and q hs).    HYDROcodone-acetaminophen (NORCO) 10-325 MG tablet Take 1 tablet by mouth 3 (three) times daily.    ketoconazole (NIZORAL) 2 % shampoo Apply 1 application topically See admin instructions. apply three times per week, massage into scalp and leave in for 10 minutes before rinsing out    magnesium oxide (MAG-OX) 400 MG tablet Take 1 tablet (400 mg total) by mouth daily.    Melatonin 10 MG CAPS Take 1 capsule by mouth.    methocarbamol (ROBAXIN) 500 MG tablet Take 500 mg by mouth 3 (three) times daily. As needed    metoprolol succinate (TOPROL-XL) 50 MG 24 hr tablet Take 1 tablet (50 mg total) by mouth daily. Take with or immediately following a meal.    montelukast (SINGULAIR) 10 MG tablet 10 mg once daily as needed    morphine (MS CONTIN) 60 MG 12 hr tablet Take 1 tablet (60 mg total) by mouth 2 (two) times daily.    mupirocin ointment (BACTROBAN) 2 % Apply 1  application topically 2 (two) times daily.    mupirocin ointment (BACTROBAN) 2 % Apply 1 application topically daily. Apply to left knee until healed    Naldemedine Tosylate 0.2 MG TABS Take 0.2 mg by mouth daily.    NYAMYC powder Apply topically.    polyethylene glycol powder (GLYCOLAX/MIRALAX) 17 GM/SCOOP powder MIX 17 GRAMS AS MARKED ON BOTTLE TOP IN 8 OUNCES OF WATER AND DRINK ONCE A DAY AS DIRECTED.    RABEprazole (ACIPHEX) 20 MG tablet TAKE 1 TABLET BY MOUTH TWICE A DAY BEFORE A MEAL    senna (SENOKOT) 8.6 MG TABS tablet Take 1 tablet by mouth 2 (two) times daily.    Simethicone (GAS-X PO) Take 2 tablets by mouth 2 (two) times daily.    sodium chloride (OCEAN) 0.65 % nasal spray Place 1 spray into the nose as needed.  SYNTHROID 100 MCG tablet Take 1 tablet (100 mcg total) by mouth daily.    triamcinolone ointment (KENALOG) 0.1 % APPLY TWICE DAILY TO BITES AND RASH UNTIL FLAT AND SMOOTH **DO NOT APPLY TO FACE**    No facility-administered encounter medications on file as of 10/19/2021.    Patient Active Problem List   Diagnosis Date Noted   SOB (shortness of breath) 10/14/2021   Rash 03/03/2021   Skin lesion 03/03/2021   Aortic atherosclerosis ([place]) 11/20/2020   Bradycardia 06/08/2020   Itching 04/08/2020   Dyspnea 01/17/2020   Chronic respiratory failure with hypoxia ([place]) 01/17/2020   Right hip pain 01/10/2020   Leukocytosis 11/15/2019   Wound of buttock 10/25/2019   COVID-19 virus infection 10/04/2019   Urinary frequency 09/26/2019   Cold feeling 09/12/2019   Left knee pain 07/04/2019   Hemoptysis 06/30/2019   Femur fracture ([place]) 08/06/2018   Primary osteoarthritis of right shoulder 08/15/2017   Right shoulder pain 05/16/2017   Chronic venous insufficiency 02/10/2017   Lymphedema 02/10/2017   Neuropathy 12/25/2016   Anemia due to blood loss, chronic 08/07/2016   GERD (gastroesophageal reflux disease) 12/10/2015   Finger pain 11/13/2015   Carotid artery  calcification 09/26/2015   External nasal lesion 09/25/2015   Muscle cramps 09/01/2015   Excessive sweating 07/30/2015   Headache 07/16/2015   Groin pain 05/14/2015   Muscle twitching 03/05/2015   Leg cramps 02/11/2015   Back pain 02/11/2015   Abdominal pain 02/11/2015   Chronic pain 01/19/2015   Acute cystitis without hematuria 12/12/2014   Left elbow pain 11/30/2014   Health care maintenance 11/30/2014   Osteoporosis 10/19/2014   Rectal bleeding 10/19/2014   Neck pain 09/03/2014   Unsteady gait 09/03/2014   Nocturia 09/03/2014   Dysphagia 06/01/2014   Nasal dryness 06/01/2014   Stress 06/01/2014   Fatigue 02/27/2014   Degenerative disc disease 12/21/2013   Pre-op evaluation 10/12/2013   Hoarseness 08/14/2013   Leg swelling 01/24/2013   CHF (congestive heart failure) ([place]) 12/29/2012   Cough 12/29/2012   OSA (obstructive sleep apnea) 07/09/2012   Osteoarthritis 07/04/2012   Symptomatic anemia 07/04/2012   Chronic constipation 07/04/2012   Pulmonary hypertension ([place]) 07/04/2012   Pulmonary nodules 07/04/2012   Hypertension 07/04/2012   Hyperlipidemia 07/04/2012   Hypothyroidism 07/04/2012    Conditions to be addressed/monitored:HTN and pain  Care Plan : RNCM Hypertension & Chronic Pain  (Adult)  Updates made by Leona Singleton, RN since 10/19/2021 12:00 AM     Problem: Hypertension  & Chronic Pain   Priority: Medium     Long-Range Goal: Hypertension & Chronic Pain Monitored   Start Date: 02/09/2021  Expected End Date: 08/31/2021  Recent Progress: Not on track  Priority: Medium  Note:   Objective:  Last practice recorded BP readings:  BP Readings from Last 3 Encounters:  10/09/21 132/76  09/26/21 (!) 149/64  07/19/21 138/74  Current Barriers:  Knowledge Deficits related to basic understanding of hypertension pathophysiology and self care management; patient reports she does not monitor blood pressures often at home.  Does state that when she does it is  typically normal.  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  Continues to report not monitoring blood pressures, regularly.  Discussed importance of monitoring blood pressure and patient agrees to check weekly.  Knowledge Deficits related to self-health management of chronic pain; patient reporting severe pain to shoulder, as well as generalized arthritis pain in other joints knees,  wrist, back, and hips.  States her shoulder is "bone on bone" and she receives injections every 3 months. Feels like current pain is being managed well at this time.  Does express some concern about not receiving quality help from the aides coming to her home, not satisfied with their services.  Report she is supposed to receive  an aide twice a week 4 hours. In addition to the assistance for ger son.  Currently, only receiving help once a week.  Discussed CCM SOCIAL WORK referral for assistance Chronic Disease Management support and education needs related to chronic pain  Case Manager Clinical Goal(s):   patient will verbalize understanding of plan for hypertension management patient will demonstrate improved adherence to prescribed treatment plan for hypertension as evidenced by taking all medications as prescribed, monitoring and recording blood pressure as directed, adhering to low sodium/DASH diet patient will verbalize understanding of plan for pain management. , patient will demonstrate use of different relaxation  skills and/or diversional activities to assist with pain reduction (distraction, imagery, relaxation, massage, acupressure, TENS, heat, and cold application., patient will use pharmacological and nonpharmacological pain relief strategies as prescribed. , and patient will verbalize acceptable level of pain relief and ability to engage in desired activities  Pain:  (Status: Goal on Track (progressing): YES.) Long Term Goal  Pain assessment performed Medications  reviewed Reviewed provider established plan for pain management; Discussed importance of adherence to all scheduled medical appointments; Counseled on the importance of reporting any/all new or changed pain symptoms or management strategies to pain management provider; Advised patient to report to care team affect of pain on daily activities; Discussed use of relaxation techniques and/or diversional activities to assist with pain reduction (distraction, imagery, relaxation, massage, acupressure, TENS, heat, and cold application; Reviewed with patient prescribed pharmacological and nonpharmacological pain relief strategies;   HTN:  (Status: Goal on track: NO.) Long Term Goal  Interventions:  Pain assessment performed Medications reviewed Collaboration with Einar Pheasant, MD regarding development and update of comprehensive plan of care as evidenced by provider attestation and co-signature Inter-disciplinary care team collaboration (see longitudinal plan of care) Evaluation of current treatment plan related to hypertension self management and patient's adherence to plan as established by provider. Provided education to patient re: stroke prevention, s/s of heart attack and stroke, DASH diet, complications of uncontrolled blood pressure Reviewed medications with patient and discussed importance of compliance Discussed plans with patient for ongoing care management follow up and provided patient with direct contact information for care management team Advised patient, providing education and rationale, to monitor blood pressure weekly and record, calling PCP for findings outside established parameters.  Encouraged to keep and attend scheduled medical appointments Encouraged to call provider office for new concerns, questions, or BP outside discussed parameters Encouraged to follow a low sodium diet/DASH diet Encouraged to check blood pressure weekly and write blood pressure results in a log to  take to appointments for provider review discusssed monitoring more frequently (daily or 3 times a week) if blood pressures are more elevated notifying provider for sustained elevations Discussed importance of monitoring blood pressure   Patient Goals/Self-Care Activities:: c  Follow Up Plan: The care management team will reach out to the patient again over the next 45 business days.      Plan:The care management team will reach out to the patient again over the next 45 days.  Hubert Azure RN, MSN RN Care Management Coordinator [place] [phone] [email or web address]

## 2021-10-19 NOTE — Patient Instructions (Addendum)
Visit Information  Thank you for taking time to visit with me today. Please don't hesitate to contact me if I can be of assistance to you before our next scheduled telephone appointment.  Following are the goals we discussed today:  Check blood pressure weekly or more frequently if elevated Write blood pressure results in a log and take log to appointments for provider review Continue to work with CCM Social Worker for resources for your son Learn relaxation techniques and use relaxation during pain  Practice acceptance of chronic pain Practice relaxation or meditation daily Tell myself I can (not I can't) Use distraction techniques Take medication as prescribed Use Ice and Heat to help with pain  Our next appointment is by telephone on 3/27 at 1100  Please call the care guide team at [phone] if you need to cancel or reschedule your appointment.   If you are experiencing a Mental Health or [place] or need someone to talk to, please call the Suicide and Crisis Lifeline: 988 call the [place] National Suicide Prevention Lifeline: 418-773-9622 or TTY: 734-662-9652 TTY 986-683-7212) to talk to a trained counselor call 1-800-273-TALK (toll free, 24 hour hotline) call 911   Patient verbalizes understanding of instructions and care plan provided today and agrees to view in [place]. Active MyChart status confirmed with patient.    Hubert Azure RN, MSN RN Care Management Coordinator [place] [phone] [email or web address]

## 2021-10-30 DIAGNOSIS — M1990 Unspecified osteoarthritis, unspecified site: Secondary | ICD-10-CM

## 2021-10-30 DIAGNOSIS — M19011 Primary osteoarthritis, right shoulder: Secondary | ICD-10-CM | POA: Diagnosis not present

## 2021-10-30 DIAGNOSIS — I10 Essential (primary) hypertension: Secondary | ICD-10-CM

## 2021-11-01 ENCOUNTER — Other Ambulatory Visit: Payer: Self-pay

## 2021-11-01 ENCOUNTER — Ambulatory Visit
Admission: RE | Admit: 2021-11-01 | Discharge: 2021-11-01 | Disposition: A | Discharge status: Home / Self Care | Payer: Medicare Other | Source: Ambulatory Visit | Attending: Internal Medicine | Admitting: Internal Medicine

## 2021-11-01 DIAGNOSIS — Z780 Asymptomatic menopausal state: Secondary | ICD-10-CM | POA: Diagnosis not present

## 2021-11-01 DIAGNOSIS — E2839 Other primary ovarian failure: Secondary | ICD-10-CM | POA: Insufficient documentation

## 2021-11-01 DIAGNOSIS — M85832 Other specified disorders of bone density and structure, left forearm: Secondary | ICD-10-CM | POA: Diagnosis not present

## 2021-11-01 DIAGNOSIS — M810 Age-related osteoporosis without current pathological fracture: Secondary | ICD-10-CM | POA: Insufficient documentation

## 2021-11-14 ENCOUNTER — Encounter: Payer: Self-pay | Admitting: Internal Medicine

## 2021-11-14 ENCOUNTER — Ambulatory Visit (INDEPENDENT_AMBULATORY_CARE_PROVIDER_SITE_OTHER): Payer: Medicare Other | Admitting: *Deleted

## 2021-11-14 DIAGNOSIS — M1990 Unspecified osteoarthritis, unspecified site: Secondary | ICD-10-CM

## 2021-11-14 DIAGNOSIS — I10 Essential (primary) hypertension: Secondary | ICD-10-CM

## 2021-11-14 NOTE — Chronic Care Management (AMB) (Signed)
?Chronic Care Management  ? ? Clinical Social Work Note ? ?11/14/2021 ?Name: Kimberly Roy MRN: [id number] DOB: [birth year] ? ?Kimberly Roy is a 88 y.o. year old female who is a primary care patient of Einar Pheasant, MD. The CCM team was consulted to assist the patient with chronic disease management and/or care coordination needs related to: [employer] .  ? ?Engaged with patient by telephone for initial visit in response to provider referral for social work chronic care management and care coordination services.  ? ?Consent to Services:  ?The patient was given the following information about Chronic Care Management services today, agreed to services, and gave verbal consent: 1. CCM service includes personalized support from designated clinical staff supervised by the primary care provider, including individualized plan of care and coordination with other care providers 2. 24/7 contact phone numbers for assistance for urgent and routine care needs. 3. Service will only be billed when office clinical staff spend 20 minutes or more in a month to coordinate care. 4. Only one practitioner may furnish and bill the service in a calendar month. 5.The patient may stop CCM services at any time (effective at the end of the month) by phone call to the office staff. 6. The patient will be responsible for cost sharing (co-pay) of up to 20% of the service fee (after annual deductible is met). Patient agreed to services and consent obtained. ? ?Patient agreed to services and consent obtained.  ? ?Assessment: Review of patient past medical history, allergies, medications, and health status, including review of relevant consultants reports was performed today as part of a comprehensive evaluation and provision of chronic care management and care coordination services.    ? ?SDOH (Social Determinants of Health) assessments and interventions performed:   ? ?Advanced Directives Status: Not addressed in this encounter. ? ?CCM Care  Plan ? ?Allergies  ?Allergen Reactions  ? Lyrica [Pregabalin] Swelling  ? Omnicef [Cefdinir] Diarrhea and Nausea And Vomiting  ? Atarax [Hydroxyzine]   ?  jittery  ? Dicyclomine Other (See Comments)  ?  Abdominal bloating ?  ? Hydroxyzine Hcl   ?  jittery  ? Levaquin [Levofloxacin] Swelling  ? Nitrofurantoin Diarrhea  ? Nucynta Er [Tapentadol Hcl Er] Other (See Comments)  ?  Severe constipation ?  ? Oxybutynin Other (See Comments)  ?  Blurred vision  ? Zoloft [Sertraline Hcl]   ?  Severe headache  ? Biaxin [Clarithromycin] Other (See Comments) and Rash  ?  Pt does not remember ?Pt does not remember  ? Sertraline Nausea And Vomiting  ?  Severe headache ?Severe headache ?Other reaction(s): Headache ?Severe headache  ? Sulfa Antibiotics Rash  ?  Pt does not remember  ? Sulfasalazine Rash  ?  Pt does not remember  ? Tape Rash  ?  Durabond - redness  ? Tapentadol Other (See Comments) and Rash  ?  Severe constipation ?Severe constipation ?Severe constipation ?Severe constipation ?Severe constipation  ? ? ?Outpatient Encounter Medications as of 11/14/2021  ?Medication Sig Note  ? albuterol (VENTOLIN HFA) 108 (90 Base) MCG/ACT inhaler INHALE 2 PUFFS INTO THE LUNGS EVERY 6 (SIX) HOURS AS NEEDED FOR WHEEZING OR SHORTNESS OF BREATH   ? aluminum-magnesium hydroxide-simethicone (MAALOX) 989-211-94 MG/5ML SUSP Take 15 mLs by mouth 4 (four) times daily -  before meals and at bedtime.  02/09/2021: Reports taking as needed  ? amLODipine (NORVASC) 10 MG tablet TAKE 1 TABLET BY MOUTH DAILY   ? aspirin 81 MG EC tablet Take  by mouth.   ? atorvastatin (LIPITOR) 20 MG tablet TAKE 1 TABLET BY MOUTH AT BEDTIME.   ? Azelastine HCl 137 MCG/SPRAY SOLN PLACE 1 SPRAY INTO BOTH NOSTRILS TWO TIMES DAILY   ? benazepril (LOTENSIN) 40 MG tablet TAKE 1 TABLET BY MOUTH DAILY   ? betamethasone dipropionate 0.05 % cream Apply 1 application topically 2 (two) times daily. 02/09/2021: Reports uses as needed  ? budesonide-formoterol (SYMBICORT) 80-4.5 MCG/ACT  inhaler Inhale 2 puffs into the lungs 2 (two) times daily. 02/09/2021: Reports not taking at this time  ? busPIRone (BUSPAR) 10 MG tablet Take 10 mg by mouth 2 (two) times daily.    ? calcium citrate-vitamin D (CITRACAL+D) 315-200 MG-UNIT tablet Take 1 tablet by mouth daily.   ? Cholecalciferol (VITAMIN D3) 1000 UNITS CAPS Take 1 capsule by mouth daily.   ? clobetasol cream (TEMOVATE) 3.32 % Apply 1 application topically See admin instructions. Apply to affected areas of body 1 - 2 times daily as needed for itchy bumps. Avoid applying to face, groin, and axilla. Use as directed.   ? Diclofenac Sodium 3 % GEL Apply topically 2 (two) times daily. 02/09/2021: As needed  ? Docusate Sodium (DSS) 100 MG CAPS Take by mouth.   ? ELMIRON 100 MG capsule Take 100 mg by mouth 3 (three) times daily.   ? Fluocinolone Acetonide 0.01 % OIL Apply 1-2 drops into ears once to twice daily as needed.   ? fluocinonide (LIDEX) 0.05 % external solution Apply topically 2 (two) times daily. Apply drops to scalp for itch   ? furosemide (LASIX) 20 MG tablet TAKE 1 TABLET BY MOUTH DAILY AS NEEDED   ? gabapentin (NEURONTIN) 300 MG capsule Take two tablets tid (in am, midday and q hs).   ? HYDROcodone-acetaminophen (NORCO) 10-325 MG tablet Take 1 tablet by mouth 3 (three) times daily.   ? ketoconazole (NIZORAL) 2 % shampoo Apply 1 application topically See admin instructions. apply three times per week, massage into scalp and leave in for 10 minutes before rinsing out   ? magnesium oxide (MAG-OX) 400 MG tablet Take 1 tablet (400 mg total) by mouth daily.   ? Melatonin 10 MG CAPS Take 1 capsule by mouth.   ? methocarbamol (ROBAXIN) 500 MG tablet Take 500 mg by mouth 3 (three) times daily. As needed   ? metoprolol succinate (TOPROL-XL) 50 MG 24 hr tablet Take 1 tablet (50 mg total) by mouth daily. Take with or immediately following a meal.   ? montelukast (SINGULAIR) 10 MG tablet 10 mg once daily as needed   ? morphine (MS CONTIN) 60 MG 12 hr tablet  Take 1 tablet (60 mg total) by mouth 2 (two) times daily.   ? mupirocin ointment (BACTROBAN) 2 % Apply 1 application topically 2 (two) times daily.   ? mupirocin ointment (BACTROBAN) 2 % Apply 1 application topically daily. Apply to left knee until healed   ? Naldemedine Tosylate 0.2 MG TABS Take 0.2 mg by mouth daily.   ? NYAMYC powder Apply topically.   ? polyethylene glycol powder (GLYCOLAX/MIRALAX) 17 GM/SCOOP powder MIX 17 GRAMS AS MARKED ON BOTTLE TOP IN 8 OUNCES OF WATER AND DRINK ONCE A DAY AS DIRECTED.   ? RABEprazole (ACIPHEX) 20 MG tablet TAKE 1 TABLET BY MOUTH TWICE A DAY BEFORE A MEAL   ? senna (SENOKOT) 8.6 MG TABS tablet Take 1 tablet by mouth 2 (two) times daily.   ? Simethicone (GAS-X PO) Take 2 tablets by mouth 2 (two)  times daily.   ? sodium chloride (OCEAN) 0.65 % nasal spray Place 1 spray into the nose as needed.   ? SYNTHROID 100 MCG tablet Take 1 tablet (100 mcg total) by mouth daily.   ? triamcinolone ointment (KENALOG) 0.1 % APPLY TWICE DAILY TO BITES AND RASH UNTIL FLAT AND SMOOTH **DO NOT APPLY TO FACE**   ? ?No facility-administered encounter medications on file as of 11/14/2021.  ? ? ?Patient Active Problem List  ? Diagnosis Date Noted  ? SOB (shortness of breath) 10/14/2021  ? Rash 03/03/2021  ? Skin lesion 03/03/2021  ? Aortic atherosclerosis ([place]) 11/20/2020  ? Bradycardia 06/08/2020  ? Itching 04/08/2020  ? Dyspnea 01/17/2020  ? Chronic respiratory failure with hypoxia ([place]) 01/17/2020  ? Right hip pain 01/10/2020  ? Leukocytosis 11/15/2019  ? Wound of buttock 10/25/2019  ? COVID-19 virus infection 10/04/2019  ? Urinary frequency 09/26/2019  ? Cold feeling 09/12/2019  ? Left knee pain 07/04/2019  ? Hemoptysis 06/30/2019  ? Femur fracture ([place]) 08/06/2018  ? Primary osteoarthritis of right shoulder 08/15/2017  ? Right shoulder pain 05/16/2017  ? Chronic venous insufficiency 02/10/2017  ? Lymphedema 02/10/2017  ? Neuropathy 12/25/2016  ? Anemia due to blood loss, chronic 08/07/2016  ?  GERD (gastroesophageal reflux disease) 12/10/2015  ? Finger pain 11/13/2015  ? Carotid artery calcification 09/26/2015  ? External nasal lesion 09/25/2015  ? Muscle cramps 09/01/2015  ? Excessive sweatin

## 2021-11-14 NOTE — Patient Instructions (Addendum)
Visit Information ? ?Thank you for taking time to visit with me today. Please don't hesitate to contact me if I can be of assistance to you before our next scheduled telephone appointment. ? ?Following are the goals we discussed today:  ?- follow-up on any referrals for help I am given ?- think ahead to make sure my need does not become an emergency ?- have a back-up plan ? ?Our next appointment is by telephone on 11/28/21 at 11am ? ?Please call the care guide team at [phone] if you need to cancel or reschedule your appointment.  ? ?If you are experiencing a Mental Health or Force or need someone to talk to, please call the Suicide and Crisis Lifeline: 988  ? ?Following is a copy of your full plan of care:  ?Care Plan : General Social Work (Adult)  ?Updates made by Vern Claude, LCSW since 11/14/2021 12:00 AM  ?  ? ?Problem: [facility] AMB "PATIENT-SPECIFIC PROBLEM"   ?Note:   ?CARE PLAN ENTRY ?(see longitudinal plan of care for additional care plan information) ? ?Current Barriers:  ?Patient with CHF and HTN in need of assistance with connection to community resources  ?Knowledge deficits and need for support, education and care coordination related to community resources support  ?Inability to perform ADL's independently, Inability to perform IADL's independently, and Lacks knowledge of community resource: related to in home care ? ?Clinical Goal(s)  ?Over the next 90 days, patient will work with care management team member to address concerns related to patient's in home care and Palliative Care needs ?Interventions provided by LCSW:  ?Assessed patient's care coordination needs related to in home care needs and discussed ongoing care management follow up  ?Patient and daughter discussed increased need for additional in home care options as current agency is not working out ?Patient's daughter confirmed efforts to contact multiple agencies but has not been able to find availabilities ?Multiple  agencies contacted on patient's behalf, referral made to [facility] [phone] they will contact patient to discuss services and availabilities ?Patient also interested in Palliative care services-information provided, services reviewed ?Collaborated with appropriate clinical care team members regarding patient needs ?Motivational Interviewing employed ?Solution-Focused Strategies employed:  ?Active listening / Reflection utilized  ?Emotional Support Provided  ?Patient Self Care Activities & Deficits:  ?Patient is unable to independently navigate community resource options without care coordination support  ?Acknowledges deficits and is motivated to resolve concern  ?Unable to perform ADLs independently ?Unable to perform IADLs independently ?Strong family or social support ? ?Initial goal documentation ? ? ?  ? ? ?Ms. Bulluck was given information about Care Management services by the embedded care coordination team including:  ?Care Management services include personalized support from designated clinical staff supervised by her physician, including individualized plan of care and coordination with other care providers ?24/7 contact phone numbers for assistance for urgent and routine care needs. ?The patient may stop CCM services at any time (effective at the end of the month) by phone call to the office staff. ? ?Patient agreed to services and verbal consent obtained.  ? ?Patient verbalizes understanding of instructions and care plan provided today and agrees to view in [place]. Active MyChart status confirmed with patient.   ? ?Telephone follow up appointment with care management team member scheduled for: 11/28/21 ? ?Victor Langenbach, LCSW ?[place] ?[phone] ? ? ?  ?

## 2021-11-15 ENCOUNTER — Other Ambulatory Visit: Payer: Self-pay | Admitting: Internal Medicine

## 2021-11-15 ENCOUNTER — Ambulatory Visit (INDEPENDENT_AMBULATORY_CARE_PROVIDER_SITE_OTHER): Payer: Medicare Other

## 2021-11-15 VITALS — Ht 62.0 in | Wt 177.0 lb

## 2021-11-15 DIAGNOSIS — Z0000 Encounter for general adult medical examination without abnormal findings: Secondary | ICD-10-CM

## 2021-11-15 NOTE — Patient Instructions (Addendum)
Kimberly Roy , Thank you for taking time to come for your Medicare Wellness Visit. I appreciate your ongoing commitment to your health goals. Please review the following plan we discussed and let me know if I can assist you in the future.   These are the goals we discussed:  Goals       Patient Stated     I plan to start walking for exercise (pt-stated)      Other     Find Help in My Community      Timeframe:  Long-Range Goal Priority:  Medium Start Date:    11/14/21                         Expected End Date:    05/17/22                   Follow Up Date 11/28/21    - follow-up on any referrals for help I am given - think ahead to make sure my need does not become an emergency - have a back-up plan    Why is this important?   Knowing how and where to find help for yourself or family in your neighborhood and community is an important skill.  You will want to take some steps to learn how.    Notes:       Increase physical activity      Water exercises up to 2 days a week , 30 minutes each session         This is a list of the screening recommended for you and due dates:  Health Maintenance  Topic Date Due   Pneumonia Vaccine (2 - PPSV23 if available, else PCV20) 10/09/2022*   Mammogram  03/08/2022   Tetanus Vaccine  09/18/2027   Flu Shot  Completed   DEXA scan (bone density measurement)  Completed   Zoster (Shingles) Vaccine  Completed   HPV Vaccine  Aged Out   COVID-19 Vaccine  Discontinued  *Topic was postponed. The date shown is not the original due date.    Opioid Pain Medicine Management Opioids are powerful medicines that are used to treat moderate to severe pain. When used for short periods of time, they can help you to: Sleep better. Do better in physical or occupational therapy. Feel better in the first few days after an injury. Recover from surgery. Opioids should be taken with the supervision of a trained health care provider. They should be taken for the  shortest period of time possible. This is because opioids can be addictive, and the longer you take opioids, the greater your risk of addiction. This addiction can also be called opioid use disorder. What are the risks? Using opioid pain medicines for longer than 3 days increases your risk of side effects. Side effects include: Constipation. Nausea and vomiting. Breathing difficulties (respiratory depression). Drowsiness. Confusion. Opioid use disorder. Itching. Taking opioid pain medicine for a long period of time can affect your ability to do daily tasks. It also puts you at risk for: Motor vehicle crashes. Depression. Suicide. Heart attack. Overdose, which can be life-threatening. What is a pain treatment plan? A pain treatment plan is an agreement between you and your health care provider. Pain is unique to each person, and treatments vary depending on your condition. To manage your pain, you and your health care provider need to work together. To help you do this: Discuss the goals of your treatment, including how  much pain you might expect to have and how you will manage the pain. Review the risks and benefits of taking opioid medicines. Remember that a good treatment plan uses more than one approach and minimizes the chance of side effects. Be honest about the amount of medicines you take and about any drug or alcohol use. Get pain medicine prescriptions from only one health care provider. Pain can be managed with many types of alternative treatments. Ask your health care provider to refer you to one or more specialists who can help you manage pain through: Physical or occupational therapy. Counseling (cognitive behavioral therapy). Good nutrition. Biofeedback. Massage. Meditation. Non-opioid medicine. Following a gentle exercise program. How to use opioid pain medicine Taking medicine Take your pain medicine exactly as told by your health care provider. Take it only when you  need it. If your pain gets less severe, you may take less than your prescribed dose if your health care provider approves. If you are not having pain, do nottake pain medicine unless your health care provider tells you to take it. If your pain is severe, do nottry to treat it yourself by taking more pills than instructed on your prescription. Contact your health care provider for help. Write down the times when you take your pain medicine. It is easy to become confused while on pain medicine. Writing the time can help you avoid overdose. Take other over-the-counter or prescription medicines only as told by your health care provider. Keeping yourself and others safe  While you are taking opioid pain medicine: Do not drive, use machinery, or power tools. Do not sign legal documents. Do not drink alcohol. Do not take sleeping pills. Do not supervise children by yourself. Do not do activities that require climbing or being in high places. Do not go to a lake, river, ocean, spa, or swimming pool. Do not share your pain medicine with anyone. Keep pain medicine in a locked cabinet or in a secure area where pets and children cannot reach it. Stopping your use of opioids If you have been taking opioid medicine for more than a few weeks, you may need to slowly decrease (taper) how much you take until you stop completely. Tapering your use of opioids can decrease your risk of symptoms of withdrawal, such as: Pain and cramping in the abdomen. Nausea. Sweating. Sleepiness. Restlessness. Uncontrollable shaking (tremors). Cravings for the medicine. Do not attempt to taper your use of opioids on your own. Talk with your health care provider about how to do this. Your health care provider may prescribe a step-down schedule based on how much medicine you are taking and how long you have been taking it. Getting rid of leftover pills Do not save any leftover pills. Get rid of leftover pills safely  by: Taking the medicine to a prescription take-back program. This is usually offered by the county or law enforcement. Bringing them to a pharmacy that has a drug disposal container. Flushing them down the toilet. Check the label or package insert of your medicine to see whether this is safe to do. Throwing them out in the trash. Check the label or package insert of your medicine to see whether this is safe to do. If it is safe to throw it out, remove the medicine from the original container, put it into a sealable bag or container, and mix it with used coffee grounds, food scraps, dirt, or cat litter before putting it in the trash. Follow these instructions at home: Activity  Do exercises as told by your health care provider. Avoid activities that make your pain worse. Return to your normal activities as told by your health care provider. Ask your health care provider what activities are safe for you. General instructions You may need to take these actions to prevent or treat constipation: Drink enough fluid to keep your urine pale yellow. Take over-the-counter or prescription medicines. Eat foods that are high in fiber, such as beans, whole grains, and fresh fruits and vegetables. Limit foods that are high in fat and processed sugars, such as fried or sweet foods. Keep all follow-up visits. This is important. Where to find support If you have been taking opioids for a long time, you may benefit from receiving support for quitting from a local support group or counselor. Ask your health care provider for a referral to these resources in your area. Where to find more information Centers for Disease Control and Prevention (CDC): [employer]Exhibition.com.br [place] Food and Drug Administration (FDA): PumpkinSearch.com.ee Get help right away if: You may have taken too much of an opioid (overdosed). Common symptoms of an overdose: Your breathing is slower or more shallow than normal. You have a very slow heartbeat  (pulse). You have slurred speech. You have nausea and vomiting. Your pupils become very small. You have other potential symptoms: You are very confused. You faint or feel like you will faint. You have cold, clammy skin. You have blue lips or fingernails. You have thoughts of harming yourself or harming others. These symptoms may represent a serious problem that is an emergency. Do not wait to see if the symptoms will go away. Get medical help right away. Call your local emergency services (911 in the [place] Do not drive yourself to the hospital.  If you ever feel like you may hurt yourself or others, or have thoughts about taking your own life, get help right away. Go to your nearest emergency department or: Call your local emergency services (911 in the [place] Call the [facility] (313-755-6810 in the [place] Call a suicide crisis helpline, such as the National Suicide Prevention Lifeline at (859)839-1732 or 988 in the [place] This is open 24 hours a day in the [place] Text the Crisis Text Line at 872-321-3127 (in the [place] Summary Opioid medicines can help you manage moderate to severe pain for a short period of time. A pain treatment plan is an agreement between you and your health care provider. Discuss the goals of your treatment, including how much pain you might expect to have and how you will manage the pain. If you think that you or someone else may have taken too much of an opioid, get medical help right away. This information is not intended to replace advice given to you by your health care provider. Make sure you discuss any questions you have with your health care provider. Document Revised: 03/14/2021 Document Reviewed: 11/29/2020 Elsevier Patient Education  2022 [employer].

## 2021-11-15 NOTE — Progress Notes (Signed)
Subjective:   Kimberly Roy is a 88 y.o. female who presents for Medicare Annual (Subsequent) preventive examination.  Review of Systems    No ROS.  Medicare Wellness Virtual Visit.  Visual/audio telehealth visit, UTA vital signs.   See social history for additional risk factors.   Cardiac Risk Factors include: advanced age (>21men, >1 women);hypertension     Objective:    Today's Vitals   11/15/21 1405  Weight: 177 lb (80.3 kg)  Height: 5\' 2"  (1.575 m)   Body mass index is 32.37 kg/m.  Advanced Directives 11/15/2021 09/26/2021 06/27/2021 03/28/2021 03/09/2021 02/09/2021 12/27/2020  Does Patient Have a Medical Advance Directive? Yes Yes Yes Yes Yes Yes Yes  Type of Estate agent of [place];Living will Healthcare Power of [place];Living will Healthcare Power of [place];Living will Living will;Healthcare Power of [employer] Power of [place];Living will Living will;Healthcare Power of [employer] Power of [place];Living will  Does patient want to make changes to medical advance directive? No - Patient declined - - - No - Patient declined No - Patient declined No - Patient declined  Copy of Healthcare Power of Attorney in Chart? Yes - validated most recent copy scanned in chart (See row information) - - - No - copy requested No - copy requested No - copy requested  Would patient like information on creating a medical advance directive? - - - - - - No - Patient declined    Current Medications (verified) Outpatient Encounter Medications as of 11/15/2021  Medication Sig   albuterol (VENTOLIN HFA) 108 (90 Base) MCG/ACT inhaler INHALE 2 PUFFS INTO THE LUNGS EVERY 6 (SIX) HOURS AS NEEDED FOR WHEEZING OR SHORTNESS OF BREATH   aluminum-magnesium hydroxide-simethicone (MAALOX) 200-200-20 MG/5ML SUSP Take 15 mLs by mouth 4 (four) times daily -  before meals and at bedtime.    amLODipine (NORVASC) 10 MG tablet TAKE 1 TABLET BY MOUTH DAILY   aspirin 81 MG EC  tablet Take by mouth.   atorvastatin (LIPITOR) 20 MG tablet TAKE 1 TABLET BY MOUTH AT BEDTIME.   Azelastine HCl 137 MCG/SPRAY SOLN PLACE 1 SPRAY INTO BOTH NOSTRILS TWO TIMES DAILY   benazepril (LOTENSIN) 40 MG tablet TAKE 1 TABLET BY MOUTH DAILY   betamethasone dipropionate 0.05 % cream Apply 1 application topically 2 (two) times daily.   budesonide-formoterol (SYMBICORT) 80-4.5 MCG/ACT inhaler Inhale 2 puffs into the lungs 2 (two) times daily.   busPIRone (BUSPAR) 10 MG tablet Take 10 mg by mouth 2 (two) times daily.    calcium citrate-vitamin D (CITRACAL+D) 315-200 MG-UNIT tablet Take 1 tablet by mouth daily.   Cholecalciferol (VITAMIN D3) 1000 UNITS CAPS Take 1 capsule by mouth daily.   clobetasol cream (TEMOVATE) 0.05 % Apply 1 application topically See admin instructions. Apply to affected areas of body 1 - 2 times daily as needed for itchy bumps. Avoid applying to face, groin, and axilla. Use as directed.   Diclofenac Sodium 3 % GEL Apply topically 2 (two) times daily.   Docusate Sodium (DSS) 100 MG CAPS Take by mouth.   ELMIRON 100 MG capsule Take 100 mg by mouth 3 (three) times daily.   Fluocinolone Acetonide 0.01 % OIL Apply 1-2 drops into ears once to twice daily as needed.   fluocinonide (LIDEX) 0.05 % external solution Apply topically 2 (two) times daily. Apply drops to scalp for itch   furosemide (LASIX) 20 MG tablet TAKE 1 TABLET BY MOUTH DAILY AS NEEDED   gabapentin (NEURONTIN) 300 MG capsule Take  two tablets tid (in am, midday and q hs).   HYDROcodone-acetaminophen (NORCO) 10-325 MG tablet Take 1 tablet by mouth 3 (three) times daily.   ketoconazole (NIZORAL) 2 % shampoo Apply 1 application topically See admin instructions. apply three times per week, massage into scalp and leave in for 10 minutes before rinsing out   magnesium oxide (MAG-OX) 400 MG tablet Take 1 tablet (400 mg total) by mouth daily.   Melatonin 10 MG CAPS Take 1 capsule by mouth.   methocarbamol (ROBAXIN) 500 MG  tablet Take 500 mg by mouth 3 (three) times daily. As needed   metoprolol succinate (TOPROL-XL) 50 MG 24 hr tablet Take 1 tablet (50 mg total) by mouth daily. Take with or immediately following a meal.   montelukast (SINGULAIR) 10 MG tablet 10 mg once daily as needed   morphine (MS CONTIN) 60 MG 12 hr tablet Take 1 tablet (60 mg total) by mouth 2 (two) times daily.   mupirocin ointment (BACTROBAN) 2 % Apply 1 application topically 2 (two) times daily.   mupirocin ointment (BACTROBAN) 2 % Apply 1 application topically daily. Apply to left knee until healed   Naldemedine Tosylate 0.2 MG TABS Take 0.2 mg by mouth daily.   NYAMYC powder Apply topically.   polyethylene glycol powder (GLYCOLAX/MIRALAX) 17 GM/SCOOP powder MIX 17 GRAMS AS MARKED ON BOTTLE TOP IN 8 OUNCES OF WATER AND DRINK ONCE A DAY AS DIRECTED.   RABEprazole (ACIPHEX) 20 MG tablet TAKE 1 TABLET BY MOUTH TWICE A DAY BEFORE A MEAL   senna (SENOKOT) 8.6 MG TABS tablet Take 1 tablet by mouth 2 (two) times daily.   Simethicone (GAS-X PO) Take 2 tablets by mouth 2 (two) times daily.   sodium chloride (OCEAN) 0.65 % nasal spray Place 1 spray into the nose as needed.   SYNTHROID 100 MCG tablet Take 1 tablet (100 mcg total) by mouth daily.   triamcinolone ointment (KENALOG) 0.1 % APPLY TWICE DAILY TO BITES AND RASH UNTIL FLAT AND SMOOTH **DO NOT APPLY TO FACE**   No facility-administered encounter medications on file as of 11/15/2021.    Allergies (verified) Lyrica [pregabalin], Omnicef [cefdinir], Atarax [hydroxyzine], Dicyclomine, Hydroxyzine hcl, Levaquin [levofloxacin], Nitrofurantoin, Nucynta er [tapentadol hcl er], Oxybutynin, Zoloft [sertraline hcl], Biaxin [clarithromycin], Sertraline, Sulfa antibiotics, Sulfasalazine, Tape, and Tapentadol   History: Past Medical History:  Diagnosis Date   Anemia    Anxiety    Chest pain    CHF (congestive heart failure) (HCC)    Constipation    DDD (degenerative disc disease), cervical     Depression    DVT (deep venous thrombosis) (HCC)    Dysphonia    Dyspnea    Fatty liver    Fatty liver    Headache    Hyperlipidemia    Hyperpiesia    Hypertension    Hypothyroidism    Interstitial cystitis    Left ventricular dysfunction    Lymphedema    Nephrolithiasis    Obstructive sleep apnea    Osteoarthritis    knees/cervical and lumbar spine   Pulmonary hypertension (HCC)    Pulmonary nodules    followed by Dr Meredeth Ide   Pure hypercholesterolemia    Renal cyst    right   Past Surgical History:  Procedure Laterality Date   ABDOMINAL HYSTERECTOMY     ovaries left in place   APPENDECTOMY     Back Surgeries     BACK SURGERY     BREAST REDUCTION SURGERY  3/99   CARDIAC CATHETERIZATION     cataracts Bilateral    CERVICAL SPINE SURGERY     ESOPHAGEAL MANOMETRY N/A 08/02/2015   Procedure: ESOPHAGEAL MANOMETRY (EM);  Surgeon: Elnita Maxwell, MD;  [facility]COPY;  Service: Endoscopy;  Laterality: N/A;   ESOPHAGOGASTRODUODENOSCOPY N/A 02/27/2015   Procedure: ESOPHAGOGASTRODUODENOSCOPY (EGD);  Surgeon: Wallace Cullens, MD;  Location[facility] ENDOSCOPY;  Service: Gastroenterology;  Laterality: N/A;   ESOPHAGOGASTRODUODENOSCOPY (EGD) WITH PROPOFOL N/A 10/30/2018   Procedure: ESOPHAGOGASTRODUODENOSCOPY (EGD) WITH PROPOFOL;  Surgeon: Christena Deem, MD;  Location: [facility] ENDOSCOPY;  Service: Endoscopy;  Laterality: N/A;   EXCISIONAL HEMORRHOIDECTOMY     EYE SURGERY     FRACTURE SURGERY     HEMORRHOID SURGERY     HIP SURGERY  2013   Right hip surgery   JOINT REPLACEMENT     KNEE ARTHROSCOPY     left and right   ORIF FEMUR FRACTURE Right 08/07/2018   Procedure: OPEN REDUCTION INTERNAL FIXATION (ORIF) DISTAL FEMUR FRACTURE;  Surgeon: Kennedy Bucker, MD;  Location: [facility] ORS;  Service: Orthopedics;  Laterality: Right;   REDUCTION MAMMAPLASTY Bilateral YRS AGO   REPLACEMENT TOTAL KNEE Bilateral    rotator cuff surgery     blilateral   TONSILECTOMY/ADENOIDECTOMY WITH  MYRINGOTOMY     VISCERAL ARTERY INTERVENTION N/A 02/25/2019   Procedure: VISCERAL ARTERY INTERVENTION;  Surgeon: Annice Needy, MD;  Location: [facility] INVASIVE CV LAB;  Service: Cardiovascular;  Laterality: N/A;   Family History  Problem Relation Age of Onset   Heart disease Mother    Stroke Mother    Hypertension Mother    Heart disease Father        myocardial infarction age 47   Breast cancer Neg Hx    Social History   Socioeconomic History   Marital status: Widowed    Spouse name: Not on file   Number of children: 2   Years of education: 22   Highest education level: 12th grade  Occupational History    Comment: insurance agency  Tobacco Use   Smoking status: Never   Smokeless tobacco: Never  Vaping Use   Vaping Use: Never used  Substance and Sexual Activity   Alcohol use: No    Alcohol/week: 0.0 standard drinks   Drug use: Never   Sexual activity: Not Currently  Other Topics Concern   Not on file  Social History Narrative   Not on file   Social Determinants of Health   Financial Resource Strain: Low Risk    Difficulty of Paying Living Expenses: Not hard at all  Food Insecurity: No Food Insecurity   Worried About Programme researcher, broadcasting/film/video in the Last Year: Never true   Ran Out of Food in the Last Year: Never true  Transportation Needs: No Transportation Needs   Lack of Transportation (Medical): No   Lack of Transportation (Non-Medical): No  Physical Activity: Inactive   Days of Exercise per Week: 0 days   Minutes of Exercise per Session: 0 min  Stress: No Stress Concern Present   Feeling of Stress : Only a little  Social Connections: Socially Isolated   Frequency of Communication with Friends and Family: Three times a week   Frequency of Social Gatherings with Friends and Family: Twice a week   Attends Religious Services: Never   Database administrator or Organizations: No   Attends Banker Meetings: Never   Marital Status: Widowed    Tobacco  Counseling Counseling given: Not  Answered   Clinical Intake:  Pre-visit preparation completed: Yes           How often do you need to have someone help you when you read instructions, pamphlets, or other written materials from your doctor or pharmacy?: 1 - Never  Interpreter Needed?: No      Activities of Daily Living In your present state of health, do you have any difficulty performing the following activities: 11/15/2021 03/09/2021  Hearing? Malvin Johns  Comment Hearing aids Hearing aides  Vision? N Y  Comment - Impaired vision.  Difficulty concentrating or making decisions? N N  Walking or climbing stairs? Y Y  Comment Walker in use Unsteady gait.  Dressing or bathing? N Y  Comment Assist close by Unsteady gait.  Doing errands, shopping? Y Y  Comment Does not drive Daughter assists.  Preparing Food and eating ? (No Data) N  Comment Some assist with meal prep. Self feeds. -  Using the Toilet? N N  In the past six months, have you accidently leaked urine? N N  Do you have problems with loss of bowel control? N N  Managing your Medications? N N  Managing your Finances? N N  Housekeeping or managing your Housekeeping? (No Data) Y  Comment Some assist Daughter assists.  Some recent data might be hidden    Patient Care Team: Dale Banks, MD as PCP - General (Internal Medicine) [name], MD as Attending Physician (Cardiology) [name], MD (Ophthalmology) [name], MD (Otolaryngology) [name], MD as Consulting Physician (Hematology and Oncology) Maple Mirza, [name]  Indicate any recent Medical Services you may have received from other than [facility] providers in the past year (date may be approximate).     Assessment:   This is a routine wellness examination for Kimberly Roy.  Virtual Visit via Telephone Note  I connected with  Kimberly Roy on 11/15/21 at  2:00 PM EDT by telephone and verified that I am speaking with the  correct person using two identifiers.  Persons participating in the virtual visit: patient/Nurse Health Advisor   I discussed the limitations of  evaluation and management service by telehealth. The patient expressed understanding and agreed to proceed. We continued and completed visit with audio only.  Some vital signs may be absent or patient reported.   Hearing/Vision screen Hearing Screening - Comments:: Followed by [facility]  Visits as needed  Hearing aid, bilateral Vision Screening - Comments:: Followed by [facility] Cataract extraction, bilateral   Dietary issues and exercise activities discussed: Current Exercise Habits: The patient does not participate in regular exercise at presentHealthy diet Good water intake   Goals Addressed               This Visit's Progress     Patient Stated     I plan to start walking for exercise (pt-stated)        Other     Increase physical activity        Water exercises up to 2 days a week , 30 minutes each session        Depression Screen PHQ 2/9 Scores 11/15/2021 07/19/2021 05/14/2021 03/09/2021 03/02/2021 02/09/2021 01/24/2021  PHQ - 2 Score 0 0 0 0 0 0 0  PHQ- 9 Score - - - - - - -  Exception Documentation - - - - - - -    Fall Risk Fall Risk  11/15/2021 07/19/2021 05/14/2021 03/09/2021 03/02/2021  Falls in the  past year? 0 0 0 0 0  Comment - - - - -  Number falls in past yr: 0 0 - 0 0  Injury with Fall? - 0 - 0 0  Comment - - - - -  Risk for fall due to : Impaired balance/gait - Impaired balance/gait;Impaired mobility Impaired balance/gait;Impaired vision;Medication side effect -  Follow up Falls evaluation completed Falls evaluation completed Falls evaluation completed Education provided;Falls prevention discussed Falls evaluation completed    FALL RISK PREVENTION PERTAINING TO THE HOME:  Home free of loose throw rugs in walkways, pet beds, electrical cords, etc? Yes  Adequate lighting in your home to reduce risk  of falls? Yes   ASSISTIVE DEVICES UTILIZED TO PREVENT FALLS: Use of a cane, walker or w/c? Yes  Grab bars in the bathroom? Yes  Shower chair or bench in shower? Yes  Elevated toilet seat or a handicapped toilet? Yes   TIMED UP AND GO: Was the test performed? No .   Cognitive Function: Patient is alert and oriented x3.  Normal cognitive status assessed by direct observation by this Nurse Health Advisor. No abnormalities found.   MMSE - Mini Mental State Exam 03/08/2015  Orientation to time 5  Orientation to Place 5  Registration 3  Attention/ Calculation 5  Recall 3  Language- name 2 objects 2  Language- repeat 1  Language- follow 3 step command 3  Language- read & follow direction 1  Write a sentence 1  Copy design 1  Total score 30     6CIT Screen 11/11/2018 09/10/2017 08/02/2016  What Year? 0 points 0 points 0 points  What month? 0 points 0 points 0 points  What time? 0 points 0 points 0 points  Count back from 20 0 points 0 points 0 points  Months in reverse 0 points 0 points 0 points  Repeat phrase 0 points 0 points 0 points  Total Score 0 0 0    Immunizations Immunization History  Administered Date(s) Administered   Fluad Quad(high Dose 65+) 05/31/2020, 05/14/2021   Influenza Split 07/13/2009, 07/03/2012   Influenza, High Dose Seasonal PF 05/13/2016, 06/11/2017, 06/05/2018, 06/07/2019   Influenza,inj,Quad PF,6+ Mos 05/25/2013, 05/31/2014, 04/27/2015   PFIZER(Purple Top)SARS-COV-2 Vaccination 12/16/2019, 01/14/2020   Pneumococcal Conjugate-13 11/29/2014   Pneumococcal-Unspecified 06/22/2001   Tdap 09/17/2017   Zoster Recombinat (Shingrix) 06/07/2019, 08/20/2019    Screening Tests Health Maintenance  Topic Date Due   Pneumonia Vaccine 17+ Years old (2 - PPSV23 if available, else PCV20) 10/09/2022 (Originally 11/29/2015)   MAMMOGRAM  03/08/2022   TETANUS/TDAP  09/18/2027   INFLUENZA VACCINE  Completed   DEXA SCAN  Completed   Zoster Vaccines- Shingrix   Completed   HPV VACCINES  Aged Out   COVID-19 Vaccine  Discontinued    Health Maintenance  There are no preventive care reminders to display for this patient.  Lung Cancer Screening: (Low Dose CT Chest recommended if Age 66-80 years, 30 pack-year currently smoking OR have quit w/in 15years.) does not qualify.   Hepatitis C Screening: does not qualify.  Vision Screening: Recommended annual ophthalmology exams for early detection of glaucoma and other disorders of the eye.  Dental Screening: Recommended annual dental exams for proper oral hygiene  Community Resource Referral / Chronic Care Management: CRR required this visit?  No   CCM required this visit?  No      Plan:   Keep all routine maintenance appointments.   I have personally reviewed and noted the following in the  patient's chart:   Medical and social history Use of alcohol, tobacco or illicit drugs  Current medications and supplements including opioid prescriptions. Taking hydrocodone. Followed by [facility].  Functional ability and status Nutritional status Physical activity Advanced directives List of other physicians Hospitalizations, surgeries, and ER visits in previous 12 months Vitals Screenings to include cognitive, depression, and falls Referrals and appointments  In addition, I have reviewed and discussed with patient certain preventive protocols, quality metrics, and best practice recommendations. A written personalized care plan for preventive services as well as general preventive health recommendations were provided to patient.     Ashok Pall, LPN   1/61/0960

## 2021-11-22 ENCOUNTER — Telehealth: Payer: Self-pay | Admitting: Nurse Practitioner

## 2021-11-22 NOTE — Telephone Encounter (Signed)
Spoke with patient  regarding the Palliative referral/services and all questions were answered and she was in agreement with beginning services with [place].  I have scheduled a MyChart Palliative Consult for 11/27/21 @ 2 PM. ?

## 2021-11-26 ENCOUNTER — Ambulatory Visit: Payer: Medicare Other | Admitting: *Deleted

## 2021-11-26 DIAGNOSIS — M19011 Primary osteoarthritis, right shoulder: Secondary | ICD-10-CM

## 2021-11-26 DIAGNOSIS — I10 Essential (primary) hypertension: Secondary | ICD-10-CM

## 2021-11-26 DIAGNOSIS — I272 Pulmonary hypertension, unspecified: Secondary | ICD-10-CM

## 2021-11-26 NOTE — Patient Instructions (Addendum)
Visit Information ? ?Thank you for taking time to visit with me today. Please don't hesitate to contact me if I can be of assistance to you before our next scheduled telephone appointment. ? ?Following are the goals we discussed today:  ?Check blood pressure weekly or more frequently if elevated ?Write blood pressure results in a log and take log to appointments for provider review ?Continue to work with CCM Education officer, museum for resources for your son ?Learn relaxation techniques and use relaxation during pain  ?Practice acceptance of chronic pain ?Practice relaxation or meditation daily ?Tell myself I can (not I can't) ?Use distraction techniques ?Take medication as prescribed ?Use Ice and Heat to help with pain ? ?Our next appointment is by telephone on 5/8 at 1000 ? ?Please call the care guide team at [phone] if you need to cancel or reschedule your appointment.  ? ?If you are experiencing a Mental Health or [place] or need someone to talk to, please call the Suicide and Crisis Lifeline: 988 ?call the [place] National Suicide Prevention Lifeline: 4182781793 or TTY: (915)332-5476 TTY 6470043183) to talk to a trained counselor ?call 1-800-273-TALK (toll free, 24 hour hotline) ?call 911  ? ?Patient verbalizes understanding of instructions and care plan provided today and agrees to view in [place]. Active MyChart status confirmed with patient.   ? ?Hubert Azure RN, MSN ?RN Care Management Coordinator ?[place] ?[phone] ?[email or web address] ? ?

## 2021-11-27 ENCOUNTER — Telehealth: Payer: Medicare Other | Admitting: Nurse Practitioner

## 2021-11-27 ENCOUNTER — Other Ambulatory Visit: Payer: Self-pay

## 2021-11-27 ENCOUNTER — Encounter: Payer: Self-pay | Admitting: Nurse Practitioner

## 2021-11-27 DIAGNOSIS — Z515 Encounter for palliative care: Secondary | ICD-10-CM | POA: Diagnosis not present

## 2021-11-27 DIAGNOSIS — M158 Other polyosteoarthritis: Secondary | ICD-10-CM | POA: Diagnosis not present

## 2021-11-27 DIAGNOSIS — G8929 Other chronic pain: Secondary | ICD-10-CM

## 2021-11-27 DIAGNOSIS — R5381 Other malaise: Secondary | ICD-10-CM | POA: Diagnosis not present

## 2021-11-27 NOTE — Addendum Note (Signed)
Addended by: Shawn Stall on: 11/27/2021 03:40 PM ? ? Modules accepted: Level of Service ? ?

## 2021-11-27 NOTE — Progress Notes (Signed)
? ? ?Manufacturing engineer ?Community Palliative Care Consult Note ?Telephone: [phone]  ?Fax: [phone]  ? ?Date of encounter: 11/27/21 ?3:15 PM ?PATIENT NAME: Kimberly Roy ?[place] [address]   ?[phone] (home)  ?DOB: 1930/10/22 ?MRN: [id number] ?PRIMARY CARE PROVIDER:    ?Einar Pheasant, MD,  ?[address] ?[place] [address] ?[phone] ? ?REFERRING PROVIDER:   ?Einar Pheasant, MD ?[address] ?[address] ?[place],  [place] [address] ?[phone] ? ?RESPONSIBLE PARTY:    ?Contact Information   ? ? Name Relation Home Work Mobile  ? Collins,Debbie Daughter [phone]    ? Eusebio Me Other   [phone]  ? ?  ? ?Due to the COVID-19 crisis, this visit was done via telemedicine from my office and it was initiated and consent by this patient and or family. ? ?I connected with  Kimberly Roy OR PROXY on 11/27/21 by a video enabled telemedicine application and verified that I am speaking with the correct person using two identifiers. ?  ?I discussed the limitations of evaluation and management by telemedicine. The patient expressed understanding and agreed to proceed. Palliative Care was asked to follow this patient by consultation request of  Einar Pheasant, MD to address advance care planning and complex medical decision making. This is the initial visit.                            ?ASSESSMENT AND PLAN / RECOMMENDATIONS:  ?Advance Care Planning/Goals of Care: Goals include to maximize quality of life and symptom management. Patient/health care surrogate gave his/her permission to discuss.Our advance care planning conversation included a discussion about:    ?The value and importance of advance care planning  ?Experiences with loved ones who have been seriously ill or have died  ?Exploration of personal, cultural or spiritual beliefs that might influence medical decisions  ?Exploration of goals of care in the event of a sudden injury  or illness  ?Identification  of a healthcare agent  ?Review and updating or creation of an  advance directive document . ?Decision not to resuscitate or to de-escalate disease focused treatments due to poor prognosis. ?CODE STATUS: Full code ? ?Symptom Management/Plan: ?1. Advance Care Planning;  Full code; Discussed at length about code status DNR vs Full code. [name] endorses she would not want to be on a ventilator for a long time. We talked about medical goals, HCPOA which is her daughter, Lubertha Basque. We talked about more discussions about ACP next visit or sooner if [name] decided to change code status ? ?2. Debility secondary to DJD. We talked about mobility, ambulating with walker. We talked about fall risk. We talked about [facility] in to help her through her LTC insurance but unhappy with care and wishes for more names. Will do PC SW consult ? ?07/19/2021 weight 176 lbs ?09/26/2021 weight 178 lbs ?10/09/2021 weight 177 lbs ? ?3. Pain secondary to DJD, OA multiple sites including cervical, bilateral shoulders, bilateral knees, back. Discussed on pain scale. We talked about current pain regimen which has been effective and managed through [facility], records not currently available. Will continue to do pain management through [facility] as well as injections.  ? ?Currently taking:  ?Hydrocodone 66m/325mg TID ?Morphine 62mq12 hrs ?Gabapentin 30031mID ?Melatonin 63m57ms ? ?4. Goals of Care: Goals include to maximize quality of life and symptom management. Our advance care planning conversation included  a discussion about:    ?The value and importance of advance care planning  ?Exploration of personal, cultural or spiritual beliefs that might influence medical decisions  ?Exploration of goals of care in the event of a sudden injury or illness  ?Identification and preparation of a healthcare agent  ?Review and updating or creation of an advance directive document. ? ?5. Palliative care  encounter; Palliative care encounter; Palliative medicine team will continue to support patient, patient's family, and medical team. Visit consisted of counseling and education dealing with the complex and emotionally intense issues of symptom management and palliative care in the setting of serious and potentially life-threatening illness ? ?Follow up Palliative Care Visit: Palliative care will continue to follow for complex medical decision making, advance care planning, and clarification of goals. Return 4 weeks or prn. ? ?I spent 62 minutes providing this consultation. More than 50% of the time in this consultation was spent in counseling and care coordination. ? ?PPS: 50% ? ?Chief Complaint: Initial Palliative consult for complex medical decision making ? ?HISTORY OF PRESENT ILLNESS:  Kimberly Roy is a 88 y.o. year old female  with multiple medical problems including pulmonary HTN, CHF, chronic venous insufficiency, aortic atherosclerosis, anemia, chronic respiratory failure with hypoxia, HTN, dysphagia, hypothyroidism, neuropathy, chronic constipation, GERD, OA, DJD multiple joints including bilateral knees, shoulders, cervical, right hip, back, unsteady gait, chronic cystitis with indwelling foley. I connected by video with [name], cousin Jeani Hawking. We talked about purpose of PC visit, past medical history getting in dept with reviewing 4 back sgy's, chronic pain, chronic cystitis, ros, symptoms, debility, fall risk. We talked about functional abilities including walking with a rollaid walker no recent falls. We talked about requiring assistance with bathing, dressing as she has [facility] in helping her with ADL's through her LTC, does not like services, wishes to get more names if PC SW can call her. We talked about her appetite which she gets meals on wheels and [employer] for her. Ms. Colasurdo is able to heat things up in the microwave. We talked about sleep patterns, hygiene though sleeps in the recliner  due to her back pain chronic. We talked about medications, revisited pain as [facility] in [employer]. We talked about upcoming injection. We talked about last visit with Dr Rogue Bussing 09/26/2021 received venofer at this visit to f/u in 4 months. We talked about medical goals, code status, role pc in [place], advance directives. We talked about quality of life. We talked about f/u PC visit, Ms. Lichtman in agreement, scheduled. Therapeutic listening, emotional support provided. Questions answered.  ? ?History obtained from review of EMR, discussion with Werner Lean with Ms. Kanitz.  ?I reviewed available labs, medications, imaging, studies and related documents from the EMR.  Records reviewed and summarized above.  ? ?ROS ?10 point system reviewed all negative except HPI ? ?Physical Exam: ?deferred ?CURRENT PROBLEM LIST:  ?Patient Active Problem List  ? Diagnosis Date Noted  ? SOB (shortness of breath) 10/14/2021  ? Rash 03/03/2021  ? Skin lesion 03/03/2021  ? Aortic atherosclerosis (Kimberly) 11/20/2020  ? Bradycardia 06/08/2020  ? Itching 04/08/2020  ? Dyspnea 01/17/2020  ? Chronic respiratory failure with hypoxia ([place]) 01/17/2020  ? Right hip pain 01/10/2020  ? Leukocytosis 11/15/2019  ? Wound of buttock 10/25/2019  ? COVID-19 virus infection 10/04/2019  ? Urinary frequency 09/26/2019  ? Cold feeling 09/12/2019  ? Left knee pain 07/04/2019  ? Hemoptysis 06/30/2019  ? Femur fracture ([place]) 08/06/2018  ?  Primary osteoarthritis of right shoulder 08/15/2017  ? Right shoulder pain 05/16/2017  ? Chronic venous insufficiency 02/10/2017  ? Lymphedema 02/10/2017  ? Neuropathy 12/25/2016  ? Anemia due to blood loss, chronic 08/07/2016  ? GERD (gastroesophageal reflux disease) 12/10/2015  ? Finger pain 11/13/2015  ? Carotid artery calcification 09/26/2015  ? External nasal lesion 09/25/2015  ? Muscle cramps 09/01/2015  ? Excessive sweating 07/30/2015  ? Headache 07/16/2015  ? Groin pain 05/14/2015  ? Muscle twitching 03/05/2015   ? Leg cramps 02/11/2015  ? Back pain 02/11/2015  ? Abdominal pain 02/11/2015  ? Chronic pain 01/19/2015  ? Acute cystitis without hematuria 12/12/2014  ? Left elbow pain 11/30/2014  ? Health care ma

## 2021-11-28 ENCOUNTER — Telehealth: Payer: Self-pay

## 2021-11-28 ENCOUNTER — Telehealth: Payer: Medicare Other

## 2021-11-28 ENCOUNTER — Ambulatory Visit: Payer: Medicare Other | Admitting: *Deleted

## 2021-11-28 DIAGNOSIS — G8929 Other chronic pain: Secondary | ICD-10-CM

## 2021-11-28 DIAGNOSIS — I10 Essential (primary) hypertension: Secondary | ICD-10-CM

## 2021-11-28 DIAGNOSIS — I509 Heart failure, unspecified: Secondary | ICD-10-CM

## 2021-11-28 NOTE — Telephone Encounter (Signed)
? ?  Telephone encounter was:  Unsuccessful.  11/28/2021 ?Name: Kimberly Roy MRN: [id number] DOB: 27-Mar-1931 ? ?Unsuccessful outbound call made today to assist with:  Transportation Needs  ? ?Outreach Attempt:  1st Attempt ? ?A HIPAA compliant voice message was left requesting a return call.  Instructed patient to call back at [employer]. ?. ? ? ? ?Larena Sox ?Care Guide, Embedded Care Coordination ?[place], Care Management  ?[phone] ?[address], [place], [place] [address] ?Phone: [phone] ?Email: [email or web address] ? ?  ?

## 2021-11-28 NOTE — Chronic Care Management (AMB) (Signed)
?Chronic Care Management  ? ? Clinical Social Work Note ? ?11/28/2021 ?Name: Kimberly Roy MRN: [id number] DOB: [birth year] ? ?Kimberly Roy is a 88 y.o. year old female who is a primary care patient of Einar Pheasant, MD. The CCM team was consulted to assist the patient with chronic disease management and/or care coordination needs related to: [employer] .  ? ?Collaboration with patient's daughter by phone  for follow up visit in response to provider referral for social work chronic care management and care coordination services.  ? ?Consent to Services:  ?The patient was given information about Chronic Care Management services, agreed to services, and gave verbal consent prior to initiation of services.  Please see initial visit note for detailed documentation.  ? ?Patient agreed to services and consent obtained.  ? ?Assessment: Review of patient past medical history, allergies, medications, and health status, including review of relevant consultants reports was performed today as part of a comprehensive evaluation and provision of chronic care management and care coordination services.    ? ?SDOH (Social Determinants of Health) assessments and interventions performed:   ? ?Advanced Directives Status: Not addressed in this encounter. ? ?CCM Care Plan ? ?Allergies  ?Allergen Reactions  ? Lyrica [Pregabalin] Swelling  ? Omnicef [Cefdinir] Diarrhea and Nausea And Vomiting  ? Atarax [Hydroxyzine]   ?  jittery  ? Dicyclomine Other (See Comments)  ?  Abdominal bloating ?  ? Hydroxyzine Hcl   ?  jittery  ? Levaquin [Levofloxacin] Swelling  ? Nitrofurantoin Diarrhea  ? Nucynta Er [Tapentadol Hcl Er] Other (See Comments)  ?  Severe constipation ?  ? Oxybutynin Other (See Comments)  ?  Blurred vision  ? Zoloft [Sertraline Hcl]   ?  Severe headache  ? Biaxin [Clarithromycin] Other (See Comments) and Rash  ?  Pt does not remember ?Pt does not remember  ? Sertraline Nausea And Vomiting  ?  Severe headache ?Severe  headache ?Other reaction(s): Headache ?Severe headache  ? Sulfa Antibiotics Rash  ?  Pt does not remember  ? Sulfasalazine Rash  ?  Pt does not remember  ? Tape Rash  ?  [place] - redness  ? Tapentadol Other (See Comments) and Rash  ?  Severe constipation ?Severe constipation ?Severe constipation ?Severe constipation ?Severe constipation  ? ? ?Outpatient Encounter Medications as of 11/28/2021  ?Medication Sig Note  ? albuterol (VENTOLIN HFA) 108 (90 Base) MCG/ACT inhaler INHALE 2 PUFFS INTO THE LUNGS EVERY 6 (SIX) HOURS AS NEEDED FOR WHEEZING OR SHORTNESS OF BREATH   ? aluminum-magnesium hydroxide-simethicone (MAALOX) 740-814-48 MG/5ML SUSP Take 15 mLs by mouth 4 (four) times daily -  before meals and at bedtime.  02/09/2021: Reports taking as needed  ? amLODipine (NORVASC) 10 MG tablet TAKE 1 TABLET BY MOUTH DAILY   ? aspirin 81 MG EC tablet Take by mouth.   ? atorvastatin (LIPITOR) 20 MG tablet TAKE 1 TABLET BY MOUTH AT BEDTIME.   ? Azelastine HCl 137 MCG/SPRAY SOLN PLACE 1 SPRAY INTO BOTH NOSTRILS TWO TIMES DAILY   ? benazepril (LOTENSIN) 40 MG tablet TAKE 1 TABLET BY MOUTH DAILY   ? betamethasone dipropionate 0.05 % cream Apply 1 application topically 2 (two) times daily. 02/09/2021: Reports uses as needed  ? budesonide-formoterol (SYMBICORT) 80-4.5 MCG/ACT inhaler Inhale 2 puffs into the lungs 2 (two) times daily. 02/09/2021: Reports not taking at this time  ? busPIRone (BUSPAR) 10 MG tablet Take 10 mg by mouth 2 (two) times daily.    ?  calcium citrate-vitamin D (CITRACAL+D) 315-200 MG-UNIT tablet Take 1 tablet by mouth daily.   ? Cholecalciferol (VITAMIN D3) 1000 UNITS CAPS Take 1 capsule by mouth daily.   ? clobetasol cream (TEMOVATE) 3.24 % Apply 1 application topically See admin instructions. Apply to affected areas of body 1 - 2 times daily as needed for itchy bumps. Avoid applying to face, groin, and axilla. Use as directed.   ? Diclofenac Sodium 3 % GEL Apply topically 2 (two) times daily. 02/09/2021: As  needed  ? Docusate Sodium (DSS) 100 MG CAPS Take by mouth.   ? ELMIRON 100 MG capsule Take 100 mg by mouth 3 (three) times daily.   ? Fluocinolone Acetonide 0.01 % OIL Apply 1-2 drops into ears once to twice daily as needed.   ? fluocinonide (LIDEX) 0.05 % external solution Apply topically 2 (two) times daily. Apply drops to scalp for itch   ? furosemide (LASIX) 20 MG tablet TAKE 1 TABLET BY MOUTH DAILY AS NEEDED   ? gabapentin (NEURONTIN) 300 MG capsule Take two tablets tid (in am, midday and q hs).   ? HYDROcodone-acetaminophen (NORCO) 10-325 MG tablet Take 1 tablet by mouth 3 (three) times daily.   ? ketoconazole (NIZORAL) 2 % shampoo Apply 1 application topically See admin instructions. apply three times per week, massage into scalp and leave in for 10 minutes before rinsing out   ? magnesium oxide (MAG-OX) 400 MG tablet Take 1 tablet (400 mg total) by mouth daily.   ? Melatonin 10 MG CAPS Take 1 capsule by mouth.   ? methocarbamol (ROBAXIN) 500 MG tablet Take 500 mg by mouth 3 (three) times daily. As needed   ? metoprolol succinate (TOPROL-XL) 50 MG 24 hr tablet TAKE 1 TABLET BY MOUTH DAILY. TAKE WITH OR IMMEDIATELY FOLLOWING A MEAL.   ? montelukast (SINGULAIR) 10 MG tablet 10 mg once daily as needed   ? morphine (MS CONTIN) 60 MG 12 hr tablet Take 1 tablet (60 mg total) by mouth 2 (two) times daily.   ? mupirocin ointment (BACTROBAN) 2 % Apply 1 application topically 2 (two) times daily.   ? mupirocin ointment (BACTROBAN) 2 % Apply 1 application topically daily. Apply to left knee until healed   ? Naldemedine Tosylate 0.2 MG TABS Take 0.2 mg by mouth daily.   ? NYAMYC powder Apply topically.   ? polyethylene glycol powder (GLYCOLAX/MIRALAX) 17 GM/SCOOP powder MIX 17 GRAMS AS MARKED ON BOTTLE TOP IN 8 OUNCES OF WATER AND DRINK ONCE A DAY AS DIRECTED.   ? RABEprazole (ACIPHEX) 20 MG tablet TAKE 1 TABLET BY MOUTH TWICE A DAY BEFORE A MEAL   ? senna (SENOKOT) 8.6 MG TABS tablet Take 1 tablet by mouth 2 (two) times  daily.   ? Simethicone (GAS-X PO) Take 2 tablets by mouth 2 (two) times daily.   ? sodium chloride (OCEAN) 0.65 % nasal spray Place 1 spray into the nose as needed.   ? SYNTHROID 100 MCG tablet Take 1 tablet (100 mcg total) by mouth daily.   ? triamcinolone ointment (KENALOG) 0.1 % APPLY TWICE DAILY TO BITES AND RASH UNTIL FLAT AND SMOOTH **DO NOT APPLY TO FACE**   ? ?No facility-administered encounter medications on file as of 11/28/2021.  ? ? ?Patient Active Problem List  ? Diagnosis Date Noted  ? SOB (shortness of breath) 10/14/2021  ? Rash 03/03/2021  ? Skin lesion 03/03/2021  ? Aortic atherosclerosis ([place]) 11/20/2020  ? Bradycardia 06/08/2020  ? Itching 04/08/2020  ?  Dyspnea 01/17/2020  ? Chronic respiratory failure with hypoxia ([place]) 01/17/2020  ? Right hip pain 01/10/2020  ? Leukocytosis 11/15/2019  ? Wound of buttock 10/25/2019  ? COVID-19 virus infection 10/04/2019  ? Urinary frequency 09/26/2019  ? Cold feeling 09/12/2019  ? Left knee pain 07/04/2019  ? Hemoptysis 06/30/2019  ? Femur fracture ([place]) 08/06/2018  ? Primary osteoarthritis of right shoulder 08/15/2017  ? Right shoulder pain 05/16/2017  ? Chronic venous insufficiency 02/10/2017  ? Lymphedema 02/10/2017  ? Neuropathy 12/25/2016  ? Anemia due to blood loss, chronic 08/07/2016  ? GERD (gastroesophageal reflux disease) 12/10/2015  ? Finger pain 11/13/2015  ? Carotid artery calcification 09/26/2015  ? External nasal lesion 09/25/2015  ? Muscle cramps 09/01/2015  ? Excessive sweating 07/30/2015  ? Headache 07/16/2015  ? Groin pain 05/14/2015  ? Muscle twitching 03/05/2015  ? Leg cramps 02/11/2015  ? Back pain 02/11/2015  ? Abdominal pain 02/11/2015  ? Chronic pain 01/19/2015  ? Acute cystitis without hematuria 12/12/2014  ? Left elbow pain 11/30/2014  ? Health care maintenance 11/30/2014  ? Osteoporosis 10/19/2014  ? Rectal bleeding 10/19/2014  ? Neck pain 09/03/2014  ? Unsteady gait 09/03/2014  ? Nocturia 09/03/2014  ? Dysphagia 06/01/2014  ? Nasal  dryness 06/01/2014  ? Stress 06/01/2014  ? Fatigue 02/27/2014  ? Degenerative disc disease 12/21/2013  ? Pre-op evaluation 10/12/2013  ? Hoarseness 08/14/2013  ? Leg swelling 01/24/2013  ? CHF (congestive heart fail

## 2021-11-28 NOTE — Patient Instructions (Signed)
Visit Information ? ?Thank you for taking time to visit with me today. Please don't hesitate to contact me if I can be of assistance to you before our next scheduled telephone appointment. ? ?Following are the goals we discussed today:  ?- follow-up on any referrals for help I am given ?- think ahead to make sure my need does not become an emergency ?- have a back-up plan ? ?Our next appointment is by telephone on 12/19/21 at 11am ? ?Please call the care guide team at [phone] if you need to cancel or reschedule your appointment.  ? ?If you are experiencing a Mental Health or [place] or need someone to talk to, please call the Suicide and Crisis Lifeline: 988  ? ?Patient verbalizes understanding of instructions and care plan provided today and agrees to view in [place]. Active MyChart status confirmed with patient.   ? ?Telephone follow up appointment with care management team member scheduled for: 12/19/21 ? ?Taejah Ohalloran, LCSW ?[place] ?[phone] ? ?

## 2021-11-30 ENCOUNTER — Telehealth: Payer: Self-pay

## 2021-11-30 DIAGNOSIS — M1990 Unspecified osteoarthritis, unspecified site: Secondary | ICD-10-CM

## 2021-11-30 DIAGNOSIS — I509 Heart failure, unspecified: Secondary | ICD-10-CM

## 2021-11-30 DIAGNOSIS — I10 Essential (primary) hypertension: Secondary | ICD-10-CM

## 2021-11-30 DIAGNOSIS — M19011 Primary osteoarthritis, right shoulder: Secondary | ICD-10-CM

## 2021-11-30 NOTE — Telephone Encounter (Signed)
? ?  Telephone encounter was:  Successful.  ?11/30/2021 ?Name: Kimberly Roy MRN: [id number] DOB: [birth year] ? ?Kimberly Roy is a 88 y.o. year old female who is a primary care patient of Einar Pheasant, MD . The community resource team was consulted for assistance with Transportation Needs  ? ?Care guide performed the following interventions: Patient provided with information about care guide support team and interviewed to confirm resource needs.Patient requested information for Transportation. The daughter [name] will call insurance for transportation coverage confimation. I will mail public senior transportation resources ? ?Follow Up Plan:  Care guide will follow up with patient by phone over the next week ? ? ? ?Larena Sox ?Care Guide, Embedded Care Coordination ?[facility], Care Management  ?[phone] ?[address], [place], [place] [address] ?Phone: [phone] ?Email: [email or web address] ? ?  ?

## 2021-11-30 NOTE — Chronic Care Management (AMB) (Signed)
?Chronic Care Management  ? ?CCM RN Visit Note ? ?11/30/2021 ?Name: Kimberly Roy MRN: [id number] DOB: [birth year] ? ?Subjective: ?Kimberly Roy is a 88 y.o. year old female who is a primary care patient of Einar Pheasant, MD. The care management team was consulted for assistance with disease management and care coordination needs.   ? ?Engaged with patient by telephone for follow up visit in response to provider referral for case management and/or care coordination services.  ? ?Consent to Services:  ?The patient was given information about Chronic Care Management services, agreed to services, and gave verbal consent prior to initiation of services.  Please see initial visit note for detailed documentation.  ? ?Patient agreed to services and verbal consent obtained.  ? ?Assessment: Review of patient past medical history, allergies, medications, health status, including review of consultants reports, laboratory and other test data, was performed as part of comprehensive evaluation and provision of chronic care management services.  ? ?SDOH (Social Determinants of Health) assessments and interventions performed:   ? ?CCM Care Plan ? ?Allergies  ?Allergen Reactions  ? Lyrica [Pregabalin] Swelling  ? Omnicef [Cefdinir] Diarrhea and Nausea And Vomiting  ? Atarax [Hydroxyzine]   ?  jittery  ? Dicyclomine Other (See Comments)  ?  Abdominal bloating ?  ? Hydroxyzine Hcl   ?  jittery  ? Levaquin [Levofloxacin] Swelling  ? Nitrofurantoin Diarrhea  ? Nucynta Er [Tapentadol Hcl Er] Other (See Comments)  ?  Severe constipation ?  ? Oxybutynin Other (See Comments)  ?  Blurred vision  ? Zoloft [Sertraline Hcl]   ?  Severe headache  ? Biaxin [Clarithromycin] Other (See Comments) and Rash  ?  Pt does not remember ?Pt does not remember  ? Sertraline Nausea And Vomiting  ?  Severe headache ?Severe headache ?Other reaction(s): Headache ?Severe headache  ? Sulfa Antibiotics Rash  ?  Pt does not remember  ? Sulfasalazine Rash  ?  Pt does not  remember  ? Tape Rash  ?  [place] - redness  ? Tapentadol Other (See Comments) and Rash  ?  Severe constipation ?Severe constipation ?Severe constipation ?Severe constipation ?Severe constipation  ? ? ?Outpatient Encounter Medications as of 11/26/2021  ?Medication Sig Note  ? albuterol (VENTOLIN HFA) 108 (90 Base) MCG/ACT inhaler INHALE 2 PUFFS INTO THE LUNGS EVERY 6 (SIX) HOURS AS NEEDED FOR WHEEZING OR SHORTNESS OF BREATH   ? aluminum-magnesium hydroxide-simethicone (MAALOX) 449-675-91 MG/5ML SUSP Take 15 mLs by mouth 4 (four) times daily -  before meals and at bedtime.  02/09/2021: Reports taking as needed  ? amLODipine (NORVASC) 10 MG tablet TAKE 1 TABLET BY MOUTH DAILY   ? aspirin 81 MG EC tablet Take by mouth.   ? atorvastatin (LIPITOR) 20 MG tablet TAKE 1 TABLET BY MOUTH AT BEDTIME.   ? Azelastine HCl 137 MCG/SPRAY SOLN PLACE 1 SPRAY INTO BOTH NOSTRILS TWO TIMES DAILY   ? benazepril (LOTENSIN) 40 MG tablet TAKE 1 TABLET BY MOUTH DAILY   ? betamethasone dipropionate 0.05 % cream Apply 1 application topically 2 (two) times daily. 02/09/2021: Reports uses as needed  ? budesonide-formoterol (SYMBICORT) 80-4.5 MCG/ACT inhaler Inhale 2 puffs into the lungs 2 (two) times daily. 02/09/2021: Reports not taking at this time  ? busPIRone (BUSPAR) 10 MG tablet Take 10 mg by mouth 2 (two) times daily.    ? calcium citrate-vitamin D (CITRACAL+D) 315-200 MG-UNIT tablet Take 1 tablet by mouth daily.   ? Cholecalciferol (VITAMIN D3) 1000 UNITS CAPS Take  1 capsule by mouth daily.   ? clobetasol cream (TEMOVATE) 2.50 % Apply 1 application topically See admin instructions. Apply to affected areas of body 1 - 2 times daily as needed for itchy bumps. Avoid applying to face, groin, and axilla. Use as directed.   ? Diclofenac Sodium 3 % GEL Apply topically 2 (two) times daily. 02/09/2021: As needed  ? Docusate Sodium (DSS) 100 MG CAPS Take by mouth.   ? ELMIRON 100 MG capsule Take 100 mg by mouth 3 (three) times daily.   ? Fluocinolone  Acetonide 0.01 % OIL Apply 1-2 drops into ears once to twice daily as needed.   ? fluocinonide (LIDEX) 0.05 % external solution Apply topically 2 (two) times daily. Apply drops to scalp for itch   ? furosemide (LASIX) 20 MG tablet TAKE 1 TABLET BY MOUTH DAILY AS NEEDED   ? gabapentin (NEURONTIN) 300 MG capsule Take two tablets tid (in am, midday and q hs).   ? HYDROcodone-acetaminophen (NORCO) 10-325 MG tablet Take 1 tablet by mouth 3 (three) times daily.   ? ketoconazole (NIZORAL) 2 % shampoo Apply 1 application topically See admin instructions. apply three times per week, massage into scalp and leave in for 10 minutes before rinsing out   ? magnesium oxide (MAG-OX) 400 MG tablet Take 1 tablet (400 mg total) by mouth daily.   ? Melatonin 10 MG CAPS Take 1 capsule by mouth.   ? methocarbamol (ROBAXIN) 500 MG tablet Take 500 mg by mouth 3 (three) times daily. As needed   ? metoprolol succinate (TOPROL-XL) 50 MG 24 hr tablet TAKE 1 TABLET BY MOUTH DAILY. TAKE WITH OR IMMEDIATELY FOLLOWING A MEAL.   ? montelukast (SINGULAIR) 10 MG tablet 10 mg once daily as needed   ? morphine (MS CONTIN) 60 MG 12 hr tablet Take 1 tablet (60 mg total) by mouth 2 (two) times daily.   ? mupirocin ointment (BACTROBAN) 2 % Apply 1 application topically 2 (two) times daily.   ? mupirocin ointment (BACTROBAN) 2 % Apply 1 application topically daily. Apply to left knee until healed   ? Naldemedine Tosylate 0.2 MG TABS Take 0.2 mg by mouth daily.   ? NYAMYC powder Apply topically.   ? polyethylene glycol powder (GLYCOLAX/MIRALAX) 17 GM/SCOOP powder MIX 17 GRAMS AS MARKED ON BOTTLE TOP IN 8 OUNCES OF WATER AND DRINK ONCE A DAY AS DIRECTED.   ? RABEprazole (ACIPHEX) 20 MG tablet TAKE 1 TABLET BY MOUTH TWICE A DAY BEFORE A MEAL   ? senna (SENOKOT) 8.6 MG TABS tablet Take 1 tablet by mouth 2 (two) times daily.   ? Simethicone (GAS-X PO) Take 2 tablets by mouth 2 (two) times daily.   ? sodium chloride (OCEAN) 0.65 % nasal spray Place 1 spray into  the nose as needed.   ? SYNTHROID 100 MCG tablet Take 1 tablet (100 mcg total) by mouth daily.   ? triamcinolone ointment (KENALOG) 0.1 % APPLY TWICE DAILY TO BITES AND RASH UNTIL FLAT AND SMOOTH **DO NOT APPLY TO FACE**   ? ?No facility-administered encounter medications on file as of 11/26/2021.  ? ? ?Patient Active Problem List  ? Diagnosis Date Noted  ? SOB (shortness of breath) 10/14/2021  ? Rash 03/03/2021  ? Skin lesion 03/03/2021  ? Aortic atherosclerosis ([place]) 11/20/2020  ? Bradycardia 06/08/2020  ? Itching 04/08/2020  ? Dyspnea 01/17/2020  ? Chronic respiratory failure with hypoxia ([place]) 01/17/2020  ? Right hip pain 01/10/2020  ? Leukocytosis 11/15/2019  ?  Wound of buttock 10/25/2019  ? COVID-19 virus infection 10/04/2019  ? Urinary frequency 09/26/2019  ? Cold feeling 09/12/2019  ? Left knee pain 07/04/2019  ? Hemoptysis 06/30/2019  ? Femur fracture ([place]) 08/06/2018  ? Primary osteoarthritis of right shoulder 08/15/2017  ? Right shoulder pain 05/16/2017  ? Chronic venous insufficiency 02/10/2017  ? Lymphedema 02/10/2017  ? Neuropathy 12/25/2016  ? Anemia due to blood loss, chronic 08/07/2016  ? GERD (gastroesophageal reflux disease) 12/10/2015  ? Finger pain 11/13/2015  ? Carotid artery calcification 09/26/2015  ? External nasal lesion 09/25/2015  ? Muscle cramps 09/01/2015  ? Excessive sweating 07/30/2015  ? Headache 07/16/2015  ? Groin pain 05/14/2015  ? Muscle twitching 03/05/2015  ? Leg cramps 02/11/2015  ? Back pain 02/11/2015  ? Abdominal pain 02/11/2015  ? Chronic pain 01/19/2015  ? Acute cystitis without hematuria 12/12/2014  ? Left elbow pain 11/30/2014  ? Health care maintenance 11/30/2014  ? Osteoporosis 10/19/2014  ? Rectal bleeding 10/19/2014  ? Neck pain 09/03/2014  ? Unsteady gait 09/03/2014  ? Nocturia 09/03/2014  ? Dysphagia 06/01/2014  ? Nasal dryness 06/01/2014  ? Stress 06/01/2014  ? Fatigue 02/27/2014  ? Degenerative disc disease 12/21/2013  ? Pre-op evaluation 10/12/2013  ? Hoarseness  08/14/2013  ? Leg swelling 01/24/2013  ? CHF (congestive heart failure) ([place]) 12/29/2012  ? Cough 12/29/2012  ? OSA (obstructive sleep apnea) 07/09/2012  ? Osteoarthritis 07/04/2012  ? Symptomatic anem

## 2021-12-06 ENCOUNTER — Encounter: Payer: Self-pay | Admitting: Internal Medicine

## 2021-12-10 DIAGNOSIS — M48061 Spinal stenosis, lumbar region without neurogenic claudication: Secondary | ICD-10-CM | POA: Diagnosis not present

## 2021-12-10 DIAGNOSIS — Z79899 Other long term (current) drug therapy: Secondary | ICD-10-CM | POA: Diagnosis not present

## 2021-12-10 DIAGNOSIS — M5416 Radiculopathy, lumbar region: Secondary | ICD-10-CM | POA: Diagnosis not present

## 2021-12-10 DIAGNOSIS — Q675 Congenital deformity of spine: Secondary | ICD-10-CM | POA: Diagnosis not present

## 2021-12-10 DIAGNOSIS — K5903 Drug induced constipation: Secondary | ICD-10-CM | POA: Diagnosis not present

## 2021-12-10 DIAGNOSIS — S76312A Strain of muscle, fascia and tendon of the posterior muscle group at thigh level, left thigh, initial encounter: Secondary | ICD-10-CM | POA: Diagnosis not present

## 2021-12-10 DIAGNOSIS — M519 Unspecified thoracic, thoracolumbar and lumbosacral intervertebral disc disorder: Secondary | ICD-10-CM | POA: Diagnosis not present

## 2021-12-10 DIAGNOSIS — M19012 Primary osteoarthritis, left shoulder: Secondary | ICD-10-CM | POA: Diagnosis not present

## 2021-12-10 DIAGNOSIS — M19011 Primary osteoarthritis, right shoulder: Secondary | ICD-10-CM | POA: Diagnosis not present

## 2021-12-11 ENCOUNTER — Ambulatory Visit: Payer: Medicare Other | Admitting: Internal Medicine

## 2021-12-11 ENCOUNTER — Encounter: Payer: Self-pay | Admitting: Internal Medicine

## 2021-12-12 ENCOUNTER — Ambulatory Visit
Admission: RE | Admit: 2021-12-12 | Discharge: 2021-12-12 | Disposition: A | Discharge status: Home / Self Care | Payer: Medicare HMO | Source: Ambulatory Visit | Attending: Internal Medicine | Admitting: Internal Medicine

## 2021-12-12 ENCOUNTER — Encounter: Payer: Self-pay | Admitting: Internal Medicine

## 2021-12-12 ENCOUNTER — Ambulatory Visit (INDEPENDENT_AMBULATORY_CARE_PROVIDER_SITE_OTHER): Payer: Medicare HMO | Admitting: Internal Medicine

## 2021-12-12 VITALS — BP 140/70 | HR 71 | Temp 98.5°F | Ht 62.0 in | Wt 177.4 lb

## 2021-12-12 DIAGNOSIS — R519 Headache, unspecified: Secondary | ICD-10-CM | POA: Diagnosis not present

## 2021-12-12 DIAGNOSIS — M79604 Pain in right leg: Secondary | ICD-10-CM | POA: Insufficient documentation

## 2021-12-12 DIAGNOSIS — H6123 Impacted cerumen, bilateral: Secondary | ICD-10-CM | POA: Diagnosis not present

## 2021-12-12 DIAGNOSIS — R600 Localized edema: Secondary | ICD-10-CM

## 2021-12-12 DIAGNOSIS — M79661 Pain in right lower leg: Secondary | ICD-10-CM

## 2021-12-12 DIAGNOSIS — M79605 Pain in left leg: Secondary | ICD-10-CM | POA: Insufficient documentation

## 2021-12-12 DIAGNOSIS — Z96651 Presence of right artificial knee joint: Secondary | ICD-10-CM | POA: Diagnosis not present

## 2021-12-12 DIAGNOSIS — M25561 Pain in right knee: Secondary | ICD-10-CM

## 2021-12-12 DIAGNOSIS — M79662 Pain in left lower leg: Secondary | ICD-10-CM | POA: Diagnosis not present

## 2021-12-12 NOTE — Progress Notes (Signed)
Chief Complaint  ?Patient presents with  ? Acute Visit  ?  Rt leg pain X several weeks  ? ?F/u with son and cousin ?1. Right knee and lower leg pain severe 10/10 x weeks and worsening with baseline pain on morphine 60 and 30 with the pain clinic in [place] s/p TKR right knee and right hip pain and fracture s/p ORIF pain right femur in ~2012. Baseline long term pain meds not helping tried icey hot, voltaren gel, aspercream with lidocaine. She has pain from right hip h/o arthritis, severe lumbar arthritis. Pain radiates from right hip to right ankle  ?She is using a wheelchair today due to pain normally does not use a wheelchair  ? ?2. Hearing loss left and right ears wears L ear aid appt with Dr. Kathyrn Sheriff upcoming and pain  ?3. C/o scalp itching and burning uses lidex and antifungal Rx shampoo and not helping  ?C/o pain in top of head h/o neck arthritis not sure if radiating up to scalp and ears causing pain ?BP 140/70 today range 119/150s/160s sbp/60s-70s on norvasc 10 mg qd toprol xl 50 mg qd  ? ? ?Review of Systems  ?HENT:  Positive for ear pain and hearing loss.   ?Musculoskeletal:  Positive for joint pain.  ?Neurological:  Positive for headaches.  ?Past Medical History:  ?Diagnosis Date  ? Anemia   ? Anxiety   ? Chest pain   ? CHF (congestive heart failure) ([place])   ? Constipation   ? DDD (degenerative disc disease), cervical   ? Depression   ? DVT (deep venous thrombosis) ([place])   ? Dysphonia   ? Dyspnea   ? Fatty liver   ? Fatty liver   ? Headache   ? Hyperlipidemia   ? Hyperpiesia   ? Hypertension   ? Hypothyroidism   ? Interstitial cystitis   ? Left ventricular dysfunction   ? Lymphedema   ? Nephrolithiasis   ? Obstructive sleep apnea   ? Osteoarthritis   ? knees/cervical and lumbar spine  ? Pulmonary hypertension ([place])   ? Pulmonary nodules   ? followed by Dr Raul Del  ? Pure hypercholesterolemia   ? Renal cyst   ? right  ? ?Past Surgical History:  ?Procedure Laterality Date  ? ABDOMINAL HYSTERECTOMY    ?  ovaries left in place  ? APPENDECTOMY    ? Back Surgeries    ? BACK SURGERY    ? BREAST REDUCTION SURGERY    ? 3/99  ? CARDIAC CATHETERIZATION    ? cataracts Bilateral   ? CERVICAL SPINE SURGERY    ? ESOPHAGEAL MANOMETRY N/A 08/02/2015  ? Procedure: ESOPHAGEAL MANOMETRY (EM);  Surgeon: Josefine Class, MD;  Location: [facility] ENDOSCOPY;  Service: Endoscopy;  Laterality: N/A;  ? ESOPHAGOGASTRODUODENOSCOPY N/A 02/27/2015  ? Procedure: ESOPHAGOGASTRODUODENOSCOPY (EGD);  Surgeon: Hulen Luster, MD;  Location: [facility] ENDOSCOPY;  Service: Gastroenterology;  Laterality: N/A;  ? ESOPHAGOGASTRODUODENOSCOPY (EGD) WITH PROPOFOL N/A 10/30/2018  ? Procedure: ESOPHAGOGASTRODUODENOSCOPY (EGD) WITH PROPOFOL;  Surgeon: Lollie Sails, MD;  Location: [facility] ENDOSCOPY;  Service: Endoscopy;  Laterality: N/A;  ? EXCISIONAL HEMORRHOIDECTOMY    ? EYE SURGERY    ? FRACTURE SURGERY    ? HEMORRHOID SURGERY    ? HIP SURGERY  2013  ? Right hip surgery  ? JOINT REPLACEMENT    ? KNEE ARTHROSCOPY    ? left and right  ? ORIF FEMUR FRACTURE Right 08/07/2018  ? Procedure: OPEN REDUCTION INTERNAL FIXATION (ORIF) DISTAL FEMUR FRACTURE;  Surgeon: Hessie Knows, MD;  Location: [facility] ORS;  Service: Orthopedics;  Laterality: Right;  ? REDUCTION MAMMAPLASTY Bilateral YRS AGO  ? REPLACEMENT TOTAL KNEE Bilateral   ? rotator cuff surgery    ? blilateral  ? TONSILECTOMY/ADENOIDECTOMY WITH MYRINGOTOMY    ? VISCERAL ARTERY INTERVENTION N/A 02/25/2019  ? Procedure: VISCERAL ARTERY INTERVENTION;  Surgeon: Algernon Huxley, MD;  Location: [facility];  Service: Cardiovascular;  Laterality: N/A;  ? ?Family History  ?Problem Relation Age of Onset  ? Heart disease Mother   ? Stroke Mother   ? Hypertension Mother   ? Heart disease Father   ?     myocardial infarction age 49  ? Breast cancer Neg Hx   ? ?Social History  ? ?Socioeconomic History  ? Marital status: Widowed  ?  Spouse name: Not on file  ? Number of children: 2  ? Years of education: 67  ? Highest education  level: 12th grade  ?Occupational History  ?  Comment: insurance agency  ?Tobacco Use  ? Smoking status: Never  ? Smokeless tobacco: Never  ?Vaping Use  ? Vaping Use: Never used  ?Substance and Sexual Activity  ? Alcohol use: No  ?  Alcohol/week: 0.0 standard drinks  ? Drug use: Never  ? Sexual activity: Not Currently  ?Other Topics Concern  ? Not on file  ?Social History Narrative  ? Not on file  ? ?Social Determinants of Health  ? ?Financial Resource Strain: Low Risk   ? Difficulty of Paying Living Expenses: Not hard at all  ?Food Insecurity: No Food Insecurity  ? Worried About Charity fundraiser in the Last Year: Never true  ? Ran Out of Food in the Last Year: Never true  ?Transportation Needs: Unmet Transportation Needs  ? Lack of Transportation (Medical): Yes  ? Lack of Transportation (Non-Medical): Yes  ?Physical Activity: Inactive  ? Days of Exercise per Week: 0 days  ? Minutes of Exercise per Session: 0 min  ?Stress: No Stress Concern Present  ? Feeling of Stress : Only a little  ?Social Connections: Socially Isolated  ? Frequency of Communication with Friends and Family: Three times a week  ? Frequency of Social Gatherings with Friends and Family: Twice a week  ? Attends Religious Services: Never  ? Active Member of Clubs or Organizations: No  ? Attends Archivist Meetings: Never  ? Marital Status: Widowed  ?Intimate Partner Violence: Not At Risk  ? Fear of Current or Ex-Partner: No  ? Emotionally Abused: No  ? Physically Abused: No  ? Sexually Abused: No  ? ?Current Meds  ?Medication Sig  ? albuterol (VENTOLIN HFA) 108 (90 Base) MCG/ACT inhaler INHALE 2 PUFFS INTO THE LUNGS EVERY 6 (SIX) HOURS AS NEEDED FOR WHEEZING OR SHORTNESS OF BREATH  ? aluminum-magnesium hydroxide-simethicone (MAALOX) 403-474-25 MG/5ML SUSP Take 15 mLs by mouth 4 (four) times daily -  before meals and at bedtime.   ? amLODipine (NORVASC) 10 MG tablet TAKE 1 TABLET BY MOUTH DAILY  ? aspirin 81 MG EC tablet Take by mouth.  ?  atorvastatin (LIPITOR) 20 MG tablet TAKE 1 TABLET BY MOUTH AT BEDTIME.  ? Azelastine HCl 137 MCG/SPRAY SOLN PLACE 1 SPRAY INTO BOTH NOSTRILS TWO TIMES DAILY  ? benazepril (LOTENSIN) 40 MG tablet TAKE 1 TABLET BY MOUTH DAILY  ? betamethasone dipropionate 0.05 % cream Apply 1 application topically 2 (two) times daily.  ? budesonide-formoterol (SYMBICORT) 80-4.5 MCG/ACT inhaler Inhale 2 puffs into the  lungs 2 (two) times daily.  ? busPIRone (BUSPAR) 10 MG tablet Take 10 mg by mouth 2 (two) times daily.   ? calcium citrate-vitamin D (CITRACAL+D) 315-200 MG-UNIT tablet Take 1 tablet by mouth daily.  ? Cholecalciferol (VITAMIN D3) 1000 UNITS CAPS Take 1 capsule by mouth daily.  ? clobetasol cream (TEMOVATE) 9.75 % Apply 1 application topically See admin instructions. Apply to affected areas of body 1 - 2 times daily as needed for itchy bumps. Avoid applying to face, groin, and axilla. Use as directed.  ? Diclofenac Sodium 3 % GEL Apply topically 2 (two) times daily.  ? Docusate Sodium (DSS) 100 MG CAPS Take by mouth.  ? ELMIRON 100 MG capsule Take 100 mg by mouth 3 (three) times daily.  ? Fluocinolone Acetonide 0.01 % OIL Apply 1-2 drops into ears once to twice daily as needed.  ? fluocinonide (LIDEX) 0.05 % external solution Apply topically 2 (two) times daily. Apply drops to scalp for itch  ? furosemide (LASIX) 20 MG tablet TAKE 1 TABLET BY MOUTH DAILY AS NEEDED  ? gabapentin (NEURONTIN) 300 MG capsule Take two tablets tid (in am, midday and q hs).  ? HYDROcodone-acetaminophen (NORCO) 10-325 MG tablet Take 1 tablet by mouth 3 (three) times daily.  ? ketoconazole (NIZORAL) 2 % shampoo Apply 1 application topically See admin instructions. apply three times per week, massage into scalp and leave in for 10 minutes before rinsing out  ? magnesium oxide (MAG-OX) 400 MG tablet Take 1 tablet (400 mg total) by mouth daily.  ? Melatonin 10 MG CAPS Take 1 capsule by mouth.  ? methocarbamol (ROBAXIN) 500 MG tablet Take 500 mg by  mouth 3 (three) times daily. As needed  ? metoprolol succinate (TOPROL-XL) 50 MG 24 hr tablet TAKE 1 TABLET BY MOUTH DAILY. TAKE WITH OR IMMEDIATELY FOLLOWING A MEAL.  ? montelukast (SINGULAIR) 10 MG tablet 10 mg

## 2021-12-12 NOTE — Progress Notes (Signed)
Shrinkage of brain likely age related  ?Age related brain changes ?Chronic left sinus disease attached Dr. Kathyrn Sheriff for him to work up if needed  ?No prior stroke seen  ?

## 2021-12-12 NOTE — Telephone Encounter (Signed)
S/w pt - scheduled with Dr Olivia Mackie today at 1pm to be evaluated ?

## 2021-12-13 ENCOUNTER — Other Ambulatory Visit: Payer: Self-pay | Admitting: Nurse Practitioner

## 2021-12-17 ENCOUNTER — Telehealth: Payer: Self-pay | Admitting: Internal Medicine

## 2021-12-17 NOTE — Telephone Encounter (Signed)
Pt returning call

## 2021-12-17 NOTE — Telephone Encounter (Signed)
Lm for pt returning call ?

## 2021-12-18 DIAGNOSIS — F419 Anxiety disorder, unspecified: Secondary | ICD-10-CM | POA: Diagnosis not present

## 2021-12-18 DIAGNOSIS — F330 Major depressive disorder, recurrent, mild: Secondary | ICD-10-CM | POA: Diagnosis not present

## 2021-12-18 DIAGNOSIS — F5105 Insomnia due to other mental disorder: Secondary | ICD-10-CM | POA: Diagnosis not present

## 2021-12-19 ENCOUNTER — Ambulatory Visit (INDEPENDENT_AMBULATORY_CARE_PROVIDER_SITE_OTHER): Payer: Medicare Other | Admitting: *Deleted

## 2021-12-19 ENCOUNTER — Telehealth: Payer: Self-pay

## 2021-12-19 DIAGNOSIS — I10 Essential (primary) hypertension: Secondary | ICD-10-CM

## 2021-12-19 DIAGNOSIS — J300 Vasomotor rhinitis: Secondary | ICD-10-CM | POA: Diagnosis not present

## 2021-12-19 DIAGNOSIS — H9203 Otalgia, bilateral: Secondary | ICD-10-CM | POA: Diagnosis not present

## 2021-12-19 DIAGNOSIS — I509 Heart failure, unspecified: Secondary | ICD-10-CM

## 2021-12-19 DIAGNOSIS — M542 Cervicalgia: Secondary | ICD-10-CM | POA: Diagnosis not present

## 2021-12-19 DIAGNOSIS — H903 Sensorineural hearing loss, bilateral: Secondary | ICD-10-CM | POA: Diagnosis not present

## 2021-12-19 NOTE — Telephone Encounter (Signed)
? ?  Telephone encounter was:  Successful.  ?12/19/2021 ?Name: KHADIJATOU BORAK MRN: [id number] DOB: [birth year] ? ?CARLON DAVIDSON is a 88 y.o. year old female who is a primary care patient of Einar Pheasant, MD . The community resource team was consulted for assistance with  transportation ? ?Care guide performed the following interventions: Patient provided with information about care guide support team and interviewed to confirm resource needs.remailing resources due to not received mail yet ? ?Follow Up Plan:  Care guide will follow up with patient by phone over the next two weeks ? ? ? ?Larena Sox ?Care Guide, Embedded Care Coordination ?[place], Care Management  ?[phone] ?[address], [place], [place] [address] ?Phone: [phone] ?Email: [email or web address] ? ?  ?

## 2021-12-19 NOTE — Patient Instructions (Signed)
Visit Information ? ?Thank you for taking time to visit with me today. Please don't hesitate to contact me if I can be of assistance to you before our next scheduled telephone appointment. ? ?Following are the goals we discussed today:  ? ?- follow-up on any referrals for help I am given ?- think ahead to make sure my need does not become an emergency ?- have a back-up plan  ?-continue to follow up with [facility] home care ? ? ?If you are experiencing a Mental Health or [place] or need someone to talk to, please call the Suicide and Crisis Lifeline: 988  ? ?Patient verbalizes understanding of instructions and care plan provided today and agrees to view in [place]. Active MyChart status confirmed with patient.   ? ?No further follow up required: patient to continue to receive personal care services through [name] ? ?Tinea Nobile, LCSW ?[place] ?[phone] ? ?

## 2021-12-19 NOTE — Chronic Care Management (AMB) (Signed)
?Chronic Care Management  ? ? Clinical Social Work Note ? ?12/19/2021 ?Name: Kimberly Roy MRN: [id number] DOB: 05-22-1931 ? ?Kimberly Roy is a 88 y.o. year old female who is a primary care patient of Einar Pheasant, MD. The CCM team was consulted to assist the patient with chronic disease management and/or care coordination needs related to: [employer] .  ? ?Engaged with patient by telephone for follow up visit in response to provider referral for social work chronic care management and care coordination services.  ? ?Consent to Services:  ?The patient was given information about Chronic Care Management services, agreed to services, and gave verbal consent prior to initiation of services.  Please see initial visit note for detailed documentation.  ? ?Patient agreed to services and consent obtained.  ? ?Assessment: Review of patient past medical history, allergies, medications, and health status, including review of relevant consultants reports was performed today as part of a comprehensive evaluation and provision of chronic care management and care coordination services.    ? ?SDOH (Social Determinants of Health) assessments and interventions performed:   ? ?Advanced Directives Status: Not addressed in this encounter. ? ?CCM Care Plan ? ?Allergies  ?Allergen Reactions  ? Lyrica [Pregabalin] Swelling  ? Omnicef [Cefdinir] Diarrhea and Nausea And Vomiting  ? Atarax [Hydroxyzine]   ?  jittery  ? Dicyclomine Other (See Comments)  ?  Abdominal bloating ?  ? Hydroxyzine Hcl   ?  jittery  ? Levaquin [Levofloxacin] Swelling  ? Nitrofurantoin Diarrhea  ? Nucynta Er [Tapentadol Hcl Er] Other (See Comments)  ?  Severe constipation ?  ? Oxybutynin Other (See Comments)  ?  Blurred vision  ? Zoloft [Sertraline Hcl]   ?  Severe headache  ? Biaxin [Clarithromycin] Other (See Comments) and Rash  ?  Pt does not remember ?Pt does not remember  ? Sertraline Nausea And Vomiting  ?  Severe headache ?Severe headache ?Other  reaction(s): Headache ?Severe headache  ? Sulfa Antibiotics Rash  ?  Pt does not remember  ? Sulfasalazine Rash  ?  Pt does not remember  ? Tape Rash  ?  [place] - redness  ? Tapentadol Other (See Comments) and Rash  ?  Severe constipation ?Severe constipation ?Severe constipation ?Severe constipation ?Severe constipation  ? ? ?Outpatient Encounter Medications as of 12/19/2021  ?Medication Sig Note  ? albuterol (VENTOLIN HFA) 108 (90 Base) MCG/ACT inhaler INHALE 2 PUFFS INTO THE LUNGS EVERY 6 (SIX) HOURS AS NEEDED FOR WHEEZING OR SHORTNESS OF BREATH   ? aluminum-magnesium hydroxide-simethicone (MAALOX) 627-035-00 MG/5ML SUSP Take 15 mLs by mouth 4 (four) times daily -  before meals and at bedtime.  02/09/2021: Reports taking as needed  ? amLODipine (NORVASC) 10 MG tablet TAKE 1 TABLET BY MOUTH DAILY   ? aspirin 81 MG EC tablet Take by mouth.   ? atorvastatin (LIPITOR) 20 MG tablet TAKE 1 TABLET BY MOUTH AT BEDTIME.   ? Azelastine HCl 137 MCG/SPRAY SOLN PLACE 1 SPRAY INTO BOTH NOSTRILS TWO TIMES DAILY   ? benazepril (LOTENSIN) 40 MG tablet TAKE 1 TABLET BY MOUTH DAILY   ? betamethasone dipropionate 0.05 % cream Apply 1 application topically 2 (two) times daily. 02/09/2021: Reports uses as needed  ? budesonide-formoterol (SYMBICORT) 80-4.5 MCG/ACT inhaler Inhale 2 puffs into the lungs 2 (two) times daily. 02/09/2021: Reports not taking at this time  ? busPIRone (BUSPAR) 10 MG tablet Take 10 mg by mouth 2 (two) times daily.    ? calcium citrate-vitamin  D (CITRACAL+D) 315-200 MG-UNIT tablet Take 1 tablet by mouth daily.   ? Cholecalciferol (VITAMIN D3) 1000 UNITS CAPS Take 1 capsule by mouth daily.   ? clobetasol cream (TEMOVATE) 0.25 % Apply 1 application topically See admin instructions. Apply to affected areas of body 1 - 2 times daily as needed for itchy bumps. Avoid applying to face, groin, and axilla. Use as directed.   ? Diclofenac Sodium 3 % GEL Apply topically 2 (two) times daily. 02/09/2021: As needed  ? Docusate  Sodium (DSS) 100 MG CAPS Take by mouth.   ? ELMIRON 100 MG capsule Take 100 mg by mouth 3 (three) times daily.   ? Fluocinolone Acetonide 0.01 % OIL Apply 1-2 drops into ears once to twice daily as needed.   ? fluocinonide (LIDEX) 0.05 % external solution Apply topically 2 (two) times daily. Apply drops to scalp for itch   ? furosemide (LASIX) 20 MG tablet TAKE 1 TABLET BY MOUTH DAILY AS NEEDED   ? gabapentin (NEURONTIN) 300 MG capsule Take two tablets tid (in am, midday and q hs).   ? HYDROcodone-acetaminophen (NORCO) 10-325 MG tablet Take 1 tablet by mouth 3 (three) times daily.   ? ketoconazole (NIZORAL) 2 % shampoo Apply 1 application topically See admin instructions. apply three times per week, massage into scalp and leave in for 10 minutes before rinsing out   ? magnesium oxide (MAG-OX) 400 MG tablet Take 1 tablet (400 mg total) by mouth daily.   ? Melatonin 10 MG CAPS Take 1 capsule by mouth.   ? methocarbamol (ROBAXIN) 500 MG tablet Take 500 mg by mouth 3 (three) times daily. As needed   ? metoprolol succinate (TOPROL-XL) 50 MG 24 hr tablet TAKE 1 TABLET BY MOUTH DAILY. TAKE WITH OR IMMEDIATELY FOLLOWING A MEAL.   ? montelukast (SINGULAIR) 10 MG tablet 10 mg once daily as needed   ? morphine (MS CONTIN) 60 MG 12 hr tablet Take 1 tablet (60 mg total) by mouth 2 (two) times daily.   ? mupirocin ointment (BACTROBAN) 2 % Apply 1 application topically 2 (two) times daily.   ? mupirocin ointment (BACTROBAN) 2 % Apply 1 application topically daily. Apply to left knee until healed   ? Naldemedine Tosylate 0.2 MG TABS Take 0.2 mg by mouth daily.   ? NYAMYC powder Apply topically.   ? polyethylene glycol powder (GLYCOLAX/MIRALAX) 17 GM/SCOOP powder MIX 17 GRAMS AS MARKED ON BOTTLE TOP IN 8 OUNCES OF WATER AND DRINK ONCE A DAY AS DIRECTED.   ? RABEprazole (ACIPHEX) 20 MG tablet TAKE 1 TABLET BY MOUTH TWICE A DAY BEFORE A MEAL   ? senna (SENOKOT) 8.6 MG TABS tablet Take 1 tablet by mouth 2 (two) times daily.   ?  Simethicone (GAS-X PO) Take 2 tablets by mouth 2 (two) times daily.   ? sodium chloride (OCEAN) 0.65 % nasal spray Place 1 spray into the nose as needed.   ? SYNTHROID 100 MCG tablet Take 1 tablet (100 mcg total) by mouth daily.   ? triamcinolone ointment (KENALOG) 0.1 % APPLY TWICE DAILY TO BITES AND RASH UNTIL FLAT AND SMOOTH **DO NOT APPLY TO FACE**   ? ?No facility-administered encounter medications on file as of 12/19/2021.  ? ? ?Patient Active Problem List  ? Diagnosis Date Noted  ? SOB (shortness of breath) 10/14/2021  ? Rash 03/03/2021  ? Skin lesion 03/03/2021  ? Aortic atherosclerosis ([place]) 11/20/2020  ? Bradycardia 06/08/2020  ? Itching 04/08/2020  ? Dyspnea 01/17/2020  ?  Chronic respiratory failure with hypoxia ([place]) 01/17/2020  ? Right hip pain 01/10/2020  ? Leukocytosis 11/15/2019  ? Wound of buttock 10/25/2019  ? COVID-19 virus infection 10/04/2019  ? Urinary frequency 09/26/2019  ? Cold feeling 09/12/2019  ? Left knee pain 07/04/2019  ? Hemoptysis 06/30/2019  ? Femur fracture ([place]) 08/06/2018  ? Primary osteoarthritis of right shoulder 08/15/2017  ? Right shoulder pain 05/16/2017  ? Chronic venous insufficiency 02/10/2017  ? Lymphedema 02/10/2017  ? Neuropathy 12/25/2016  ? Anemia due to blood loss, chronic 08/07/2016  ? GERD (gastroesophageal reflux disease) 12/10/2015  ? Finger pain 11/13/2015  ? Carotid artery calcification 09/26/2015  ? External nasal lesion 09/25/2015  ? Muscle cramps 09/01/2015  ? Excessive sweating 07/30/2015  ? Headache 07/16/2015  ? Groin pain 05/14/2015  ? Muscle twitching 03/05/2015  ? Leg cramps 02/11/2015  ? Back pain 02/11/2015  ? Abdominal pain 02/11/2015  ? Chronic pain 01/19/2015  ? Acute cystitis without hematuria 12/12/2014  ? Left elbow pain 11/30/2014  ? Health care maintenance 11/30/2014  ? Osteoporosis 10/19/2014  ? Rectal bleeding 10/19/2014  ? Neck pain 09/03/2014  ? Unsteady gait 09/03/2014  ? Nocturia 09/03/2014  ? Dysphagia 06/01/2014  ? Nasal dryness  06/01/2014  ? Stress 06/01/2014  ? Fatigue 02/27/2014  ? Degenerative disc disease 12/21/2013  ? Pre-op evaluation 10/12/2013  ? Hoarseness 08/14/2013  ? Leg swelling 01/24/2013  ? CHF (congestive heart failure) ([place]) 04/

## 2021-12-25 ENCOUNTER — Telehealth: Payer: Self-pay | Admitting: Nurse Practitioner

## 2021-12-25 ENCOUNTER — Encounter: Payer: Self-pay | Admitting: Nurse Practitioner

## 2021-12-25 NOTE — Telephone Encounter (Signed)
I called Ms Osgood to confirm in person PC visit. Ms. Swanner endorses she would like to cancel pc, she does not need any more. Discussed and explained that if she changes her mind to discuss with primary to send another consult request over, verbalized understanding ?

## 2021-12-26 DIAGNOSIS — I340 Nonrheumatic mitral (valve) insufficiency: Secondary | ICD-10-CM | POA: Diagnosis not present

## 2021-12-26 DIAGNOSIS — I10 Essential (primary) hypertension: Secondary | ICD-10-CM | POA: Diagnosis not present

## 2021-12-26 DIAGNOSIS — I872 Venous insufficiency (chronic) (peripheral): Secondary | ICD-10-CM | POA: Diagnosis not present

## 2021-12-26 DIAGNOSIS — I5032 Chronic diastolic (congestive) heart failure: Secondary | ICD-10-CM | POA: Diagnosis not present

## 2021-12-26 DIAGNOSIS — I350 Nonrheumatic aortic (valve) stenosis: Secondary | ICD-10-CM | POA: Diagnosis not present

## 2021-12-26 DIAGNOSIS — E7800 Pure hypercholesterolemia, unspecified: Secondary | ICD-10-CM | POA: Diagnosis not present

## 2022-01-01 DIAGNOSIS — M25512 Pain in left shoulder: Secondary | ICD-10-CM | POA: Diagnosis not present

## 2022-01-01 DIAGNOSIS — M25511 Pain in right shoulder: Secondary | ICD-10-CM | POA: Diagnosis not present

## 2022-01-02 ENCOUNTER — Telehealth: Payer: Self-pay

## 2022-01-02 NOTE — Progress Notes (Signed)
This encounter was created in error - please disregard. ?

## 2022-01-02 NOTE — Telephone Encounter (Signed)
? ?  Telephone encounter was:  Successful.  ?01/02/2022 ?Name: Kimberly Roy MRN: [id number] DOB: 26-Oct-1930 ? ?Kimberly Roy is a 88 y.o. year old female who is a primary care patient of Einar Pheasant, MD . The community resource team was consulted for assistance with Transportation Needs  ? ?Care guide performed the following interventions: Patient provided with information about care guide support team and interviewed to confirm resource needs.Follow up mail. Patient did receive resources in the mail ? ?Follow Up Plan:  No further follow up planned at this time. The patient has been provided with needed resources. ? ? ? ?Larena Sox ?Care Guide, Embedded Care Coordination ?[place], Care Management  ?[phone] ?[address], [place], [place] [address] ?Phone: [phone] ?Email: [email or web address] ? ?  ?

## 2022-01-07 ENCOUNTER — Telehealth: Payer: Medicare Other

## 2022-01-07 ENCOUNTER — Telehealth: Payer: Self-pay | Admitting: *Deleted

## 2022-01-07 DIAGNOSIS — M19011 Primary osteoarthritis, right shoulder: Secondary | ICD-10-CM | POA: Diagnosis not present

## 2022-01-07 DIAGNOSIS — Z79899 Other long term (current) drug therapy: Secondary | ICD-10-CM | POA: Diagnosis not present

## 2022-01-07 DIAGNOSIS — M519 Unspecified thoracic, thoracolumbar and lumbosacral intervertebral disc disorder: Secondary | ICD-10-CM | POA: Diagnosis not present

## 2022-01-07 DIAGNOSIS — M19012 Primary osteoarthritis, left shoulder: Secondary | ICD-10-CM | POA: Diagnosis not present

## 2022-01-07 DIAGNOSIS — M48061 Spinal stenosis, lumbar region without neurogenic claudication: Secondary | ICD-10-CM | POA: Diagnosis not present

## 2022-01-07 DIAGNOSIS — S76312A Strain of muscle, fascia and tendon of the posterior muscle group at thigh level, left thigh, initial encounter: Secondary | ICD-10-CM | POA: Diagnosis not present

## 2022-01-07 DIAGNOSIS — K5903 Drug induced constipation: Secondary | ICD-10-CM | POA: Diagnosis not present

## 2022-01-07 DIAGNOSIS — M5416 Radiculopathy, lumbar region: Secondary | ICD-10-CM | POA: Diagnosis not present

## 2022-01-07 DIAGNOSIS — Q675 Congenital deformity of spine: Secondary | ICD-10-CM | POA: Diagnosis not present

## 2022-01-07 NOTE — Telephone Encounter (Signed)
?  Care Management  ? ?Follow Up Note ? ? ?01/07/2022 ?Name: Kimberly Roy MRN: [id number] DOB: [birth year] ? ? ?Referred by: Einar Pheasant, MD ?Reason for referral : Chronic Care Management (COPD) ? ? ?An unsuccessful telephone outreach was attempted today. The patient was referred to the case management team for assistance with care management and care coordination.  ? ?Follow Up Plan: RNCM will seek assistance from Care Guides in rescheduling appointment within the next 30 days. ? ?Hubert Azure RN, MSN ?RN Care Management Coordinator ?[place] ?[phone] ?[email or web address] ? ?

## 2022-01-11 DIAGNOSIS — M542 Cervicalgia: Secondary | ICD-10-CM | POA: Diagnosis not present

## 2022-01-16 ENCOUNTER — Telehealth: Payer: Self-pay | Admitting: Internal Medicine

## 2022-01-16 ENCOUNTER — Other Ambulatory Visit: Payer: Self-pay

## 2022-01-16 MED ORDER — NYAMYC 100000 UNIT/GM EX POWD: Freq: Two times a day (BID) | CUTANEOUS | 3 refills | Status: DC

## 2022-01-16 NOTE — Telephone Encounter (Signed)
Pt need refill on [facility] powder sent to  [facility].  ?

## 2022-01-16 NOTE — Telephone Encounter (Signed)
sent ?

## 2022-01-21 ENCOUNTER — Ambulatory Visit (INDEPENDENT_AMBULATORY_CARE_PROVIDER_SITE_OTHER): Payer: Medicare HMO | Admitting: Internal Medicine

## 2022-01-21 ENCOUNTER — Encounter: Payer: Self-pay | Admitting: Internal Medicine

## 2022-01-21 VITALS — BP 154/68 | HR 64 | Temp 98.6°F | Resp 16 | Ht 62.0 in | Wt 176.4 lb

## 2022-01-21 DIAGNOSIS — G629 Polyneuropathy, unspecified: Secondary | ICD-10-CM | POA: Diagnosis not present

## 2022-01-21 DIAGNOSIS — J9611 Chronic respiratory failure with hypoxia: Secondary | ICD-10-CM

## 2022-01-21 DIAGNOSIS — E033 Postinfectious hypothyroidism: Secondary | ICD-10-CM | POA: Diagnosis not present

## 2022-01-21 DIAGNOSIS — M7989 Other specified soft tissue disorders: Secondary | ICD-10-CM

## 2022-01-21 DIAGNOSIS — K219 Gastro-esophageal reflux disease without esophagitis: Secondary | ICD-10-CM | POA: Diagnosis not present

## 2022-01-21 DIAGNOSIS — I509 Heart failure, unspecified: Secondary | ICD-10-CM

## 2022-01-21 DIAGNOSIS — I10 Essential (primary) hypertension: Secondary | ICD-10-CM

## 2022-01-21 DIAGNOSIS — I700 Atherosclerosis of aorta: Secondary | ICD-10-CM | POA: Diagnosis not present

## 2022-01-21 DIAGNOSIS — F439 Reaction to severe stress, unspecified: Secondary | ICD-10-CM

## 2022-01-21 DIAGNOSIS — I272 Pulmonary hypertension, unspecified: Secondary | ICD-10-CM

## 2022-01-21 DIAGNOSIS — E785 Hyperlipidemia, unspecified: Secondary | ICD-10-CM

## 2022-01-21 DIAGNOSIS — M25562 Pain in left knee: Secondary | ICD-10-CM

## 2022-01-21 LAB — LIPID PANEL
Cholesterol: 143 mg/dL (ref 0–200)
HDL: 57.9 mg/dL (ref >39.00)
LDL Cholesterol: 63 mg/dL (ref 0–99)
NonHDL: 84.97
Total CHOL/HDL Ratio: 2
Triglycerides: 108 mg/dL (ref 0.0–149.0)
VLDL: 21.6 mg/dL (ref 0.0–40.0)

## 2022-01-21 LAB — HEPATIC FUNCTION PANEL
ALT: 13 U/L (ref 0–35)
AST: 15 U/L (ref 0–37)
Albumin: 4.3 g/dL (ref 3.5–5.2)
Alkaline Phosphatase: 96 U/L (ref 39–117)
Bilirubin, Direct: 0.1 mg/dL (ref 0.0–0.3)
Total Bilirubin: 0.4 mg/dL (ref 0.2–1.2)
Total Protein: 6.4 g/dL (ref 6.0–8.3)

## 2022-01-21 LAB — CBC WITH DIFFERENTIAL/PLATELET
Basophils Absolute: 0 10*3/uL (ref 0.0–0.1)
Basophils Relative: 0.3 % (ref 0.0–3.0)
Eosinophils Absolute: 0.2 10*3/uL (ref 0.0–0.7)
Eosinophils Relative: 2.9 % (ref 0.0–5.0)
HCT: 33.3 % — ABNORMAL LOW (ref 36.0–46.0)
Hemoglobin: 10.9 g/dL — ABNORMAL LOW (ref 12.0–15.0)
Lymphocytes Relative: 24.2 % (ref 12.0–46.0)
Lymphs Abs: 1.7 10*3/uL (ref 0.7–4.0)
MCHC: 32.6 g/dL (ref 30.0–36.0)
MCV: 103 fl — ABNORMAL HIGH (ref 78.0–100.0)
Monocytes Absolute: 0.5 10*3/uL (ref 0.1–1.0)
Monocytes Relative: 7.2 % (ref 3.0–12.0)
Neutro Abs: 4.5 10*3/uL (ref 1.4–7.7)
Neutrophils Relative %: 65.4 % (ref 43.0–77.0)
Platelets: 144 10*3/uL — ABNORMAL LOW (ref 150.0–400.0)
RBC: 3.23 Mil/uL — ABNORMAL LOW (ref 3.87–5.11)
RDW: 14.7 % (ref 11.5–15.5)
WBC: 6.9 10*3/uL (ref 4.0–10.5)

## 2022-01-21 LAB — BASIC METABOLIC PANEL
BUN: 23 mg/dL (ref 6–23)
CO2: 33 mEq/L — ABNORMAL HIGH (ref 19–32)
Calcium: 10 mg/dL (ref 8.4–10.5)
Chloride: 101 mEq/L (ref 96–112)
Creatinine, Ser: 1.02 mg/dL (ref 0.40–1.20)
GFR: 48.99 mL/min — ABNORMAL LOW (ref >60.00)
Glucose, Bld: 84 mg/dL (ref 70–99)
Potassium: 4.7 mEq/L (ref 3.5–5.1)
Sodium: 140 mEq/L (ref 135–145)

## 2022-01-21 LAB — TSH: TSH: 4.63 u[IU]/mL (ref 0.35–5.50)

## 2022-01-21 MED ORDER — NYSTATIN 100000 UNIT/GM EX POWD: 1.0000 "application " | Freq: Two times a day (BID) | CUTANEOUS | 0 refills | Status: DC | PRN

## 2022-01-21 NOTE — Progress Notes (Signed)
Patient ID: MALEEKA SABATINO, female   DOB: 1931-03-13, 88 y.o.   MRN: [id number]   Subjective:    Patient ID: [name], female    DOB: 1931/07/25, 88 y.o.   MRN: [id number]   Patient here for a scheduled follow up.   Chief Complaint  Patient presents with   Follow-up   Hypertension   Hypothyroidism   .   HPI She is accompanied by her daughter.  History obtained from both of them.  No chest pain.  Breathing overall is stable.  No increased cough or congestion.  Eating.  No increased abdominal pain.  Bowels appear to be stable.  Leg swelling is better.  Wearing compression hose.  Increased pain - left posterior knee.  Fullness.  Discussed possible Bakers cyst.  Blood pressure doing better.  Most pressures averaging 130s/50-60s.  Request refill nystatin powder.     Past Medical History:  Diagnosis Date   Anemia    Anxiety    Chest pain    CHF (congestive heart failure) (HCC)    Constipation    DDD (degenerative disc disease), cervical    Depression    DVT (deep venous thrombosis) (HCC)    Dysphonia    Dyspnea    Fatty liver    Fatty liver    Headache    Hyperlipidemia    Hyperpiesia    Hypertension    Hypothyroidism    Interstitial cystitis    Left ventricular dysfunction    Lymphedema    Nephrolithiasis    Obstructive sleep apnea    Osteoarthritis    knees/cervical and lumbar spine   Pulmonary hypertension (HCC)    Pulmonary nodules    followed by Dr Raul Del   Pure hypercholesterolemia    Renal cyst    right   Past Surgical History:  Procedure Laterality Date   ABDOMINAL HYSTERECTOMY     ovaries left in place   APPENDECTOMY     Back Surgeries     BACK SURGERY     BREAST REDUCTION SURGERY     3/99   CARDIAC CATHETERIZATION     cataracts Bilateral    CERVICAL SPINE SURGERY     ESOPHAGEAL MANOMETRY N/A 08/02/2015   Procedure: ESOPHAGEAL MANOMETRY (EM);  Surgeon: Josefine Class, MD;  Location: [facility] ENDOSCOPY;  Service: Endoscopy;  Laterality: N/A;    ESOPHAGOGASTRODUODENOSCOPY N/A 02/27/2015   Procedure: ESOPHAGOGASTRODUODENOSCOPY (EGD);  Surgeon: Hulen Luster, MD;  Location: [facility] ENDOSCOPY;  Service: Gastroenterology;  Laterality: N/A;   ESOPHAGOGASTRODUODENOSCOPY (EGD) WITH PROPOFOL N/A 10/30/2018   Procedure: ESOPHAGOGASTRODUODENOSCOPY (EGD) WITH PROPOFOL;  Surgeon: Lollie Sails, MD;  Location: [facility] ENDOSCOPY;  Service: Endoscopy;  Laterality: N/A;   EXCISIONAL HEMORRHOIDECTOMY     EYE SURGERY     FRACTURE SURGERY     HEMORRHOID SURGERY     HIP SURGERY  2013   Right hip surgery   JOINT REPLACEMENT     KNEE ARTHROSCOPY     left and right   ORIF FEMUR FRACTURE Right 08/07/2018   Procedure: OPEN REDUCTION INTERNAL FIXATION (ORIF) DISTAL FEMUR FRACTURE;  Surgeon: Hessie Knows, MD;  Location: [facility] ORS;  Service: Orthopedics;  Laterality: Right;   REDUCTION MAMMAPLASTY Bilateral YRS AGO   REPLACEMENT TOTAL KNEE Bilateral    rotator cuff surgery     blilateral   TONSILECTOMY/ADENOIDECTOMY WITH MYRINGOTOMY     VISCERAL ARTERY INTERVENTION N/A 02/25/2019   Procedure: VISCERAL ARTERY INTERVENTION;  Surgeon: Algernon Huxley, MD;  Location: [place] INVASIVE CV  LAB;  Service: Cardiovascular;  Laterality: N/A;   Family History  Problem Relation Age of Onset   Heart disease Mother    Stroke Mother    Hypertension Mother    Heart disease Father        myocardial infarction age 58   Breast cancer Neg Hx    Social History   Socioeconomic History   Marital status: Widowed    Spouse name: Not on file   Number of children: 2   Years of education: 78   Highest education level: 12th grade  Occupational History    Comment: insurance agency  Tobacco Use   Smoking status: Never   Smokeless tobacco: Never  Vaping Use   Vaping Use: Never used  Substance and Sexual Activity   Alcohol use: No    Alcohol/week: 0.0 standard drinks   Drug use: Never   Sexual activity: Not Currently  Other Topics Concern   Not on file  Social History Narrative    Not on file   Social Determinants of Health   Financial Resource Strain: Low Risk    Difficulty of Paying Living Expenses: Not hard at all  Food Insecurity: No Food Insecurity   Worried About Charity fundraiser in the Last Year: Never true   Arboriculturist in the Last Year: Never true  Transportation Needs: Unmet Transportation Needs   Lack of Transportation (Medical): Yes   Lack of Transportation (Non-Medical): Yes  Physical Activity: Inactive   Days of Exercise per Week: 0 days   Minutes of Exercise per Session: 0 min  Stress: No Stress Concern Present   Feeling of Stress : Only a little  Social Connections: Socially Isolated   Frequency of Communication with Friends and Family: Three times a week   Frequency of Social Gatherings with Friends and Family: Twice a week   Attends Religious Services: Never   Marine scientist or Organizations: No   Attends Archivist Meetings: Never   Marital Status: Widowed     Review of Systems  Constitutional:  Negative for appetite change and unexpected weight change.  HENT:  Negative for congestion and sinus pressure.   Respiratory:  Negative for cough and chest tightness.        Breathing stable.   Cardiovascular:  Negative for chest pain and palpitations.  Gastrointestinal:  Negative for abdominal pain, diarrhea, nausea and vomiting.  Genitourinary:  Negative for difficulty urinating and dysuria.  Musculoskeletal:  Negative for myalgias.       Left posterior knee discomfort/fullness as outlined.    Skin:  Negative for color change and rash.  Neurological:  Negative for dizziness, light-headedness and headaches.  Psychiatric/Behavioral:  Negative for agitation and dysphoric mood.       Objective:     BP (!) 154/68 (BP Location: Left Arm, Patient Position: Sitting, Cuff Size: Large)   Pulse 64   Temp 98.6 F (37 C) (Temporal)   Resp 16   Ht _0  (1.575 m)   Wt 176 lb 6.4 oz (80 kg)   SpO2 93%   BMI 32.26  kg/m  Wt Readings from Last 3 Encounters:  01/23/22 176 lb (79.8 kg)  01/21/22 176 lb 6.4 oz (80 kg)  12/12/21 177 lb 6.4 oz (80.5 kg)    Physical Exam Vitals reviewed.  Constitutional:      General: She is not in acute distress.    Appearance: Normal appearance.  HENT:     Head: Normocephalic  and atraumatic.     Right Ear: External ear normal.     Left Ear: External ear normal.  Eyes:     General: No scleral icterus.       Right eye: No discharge.        Left eye: No discharge.     Conjunctiva/sclera: Conjunctivae normal.  Neck:     Thyroid: No thyromegaly.  Cardiovascular:     Rate and Rhythm: Normal rate and regular rhythm.  Pulmonary:     Effort: No respiratory distress.     Breath sounds: Normal breath sounds. No wheezing.  Abdominal:     General: Bowel sounds are normal.     Palpations: Abdomen is soft.     Tenderness: There is no abdominal tenderness.  Musculoskeletal:        General: No tenderness.     Cervical back: Neck supple. No tenderness.     Comments: Swelling improved - stable.   Lymphadenopathy:     Cervical: No cervical adenopathy.  Skin:    Findings: No erythema or rash.  Neurological:     Mental Status: She is alert.  Psychiatric:        Mood and Affect: Mood normal.        Behavior: Behavior normal.     Outpatient Encounter Medications as of 01/21/2022  Medication Sig   albuterol (VENTOLIN HFA) 108 (90 Base) MCG/ACT inhaler INHALE 2 PUFFS INTO THE LUNGS EVERY 6 (SIX) HOURS AS NEEDED FOR WHEEZING OR SHORTNESS OF BREATH   aluminum-magnesium hydroxide-simethicone (MAALOX) 200-200-20 MG/5ML SUSP Take 15 mLs by mouth 4 (four) times daily -  before meals and at bedtime.    amLODipine (NORVASC) 10 MG tablet TAKE 1 TABLET BY MOUTH DAILY   aspirin 81 MG EC tablet Take by mouth.   atorvastatin (LIPITOR) 20 MG tablet TAKE 1 TABLET BY MOUTH AT BEDTIME.   Azelastine HCl 137 MCG/SPRAY SOLN PLACE 1 SPRAY INTO BOTH NOSTRILS TWO TIMES DAILY   benazepril  (LOTENSIN) 40 MG tablet TAKE 1 TABLET BY MOUTH DAILY   betamethasone dipropionate 0.05 % cream Apply 1 application topically 2 (two) times daily.   busPIRone (BUSPAR) 10 MG tablet Take 10 mg by mouth 2 (two) times daily.    calcium citrate-vitamin D (CITRACAL+D) 315-200 MG-UNIT tablet Take 1 tablet by mouth daily.   Cholecalciferol (VITAMIN D3) 1000 UNITS CAPS Take 1 capsule by mouth daily.   clobetasol cream (TEMOVATE) 3.01 % Apply 1 application topically See admin instructions. Apply to affected areas of body 1 - 2 times daily as needed for itchy bumps. Avoid applying to face, groin, and axilla. Use as directed.   Diclofenac Sodium 3 % GEL Apply topically 2 (two) times daily.   Docusate Sodium (DSS) 100 MG CAPS Take by mouth.   ELMIRON 100 MG capsule Take 100 mg by mouth 3 (three) times daily.   Fluocinolone Acetonide 0.01 % OIL Apply 1-2 drops into ears once to twice daily as needed.   fluocinonide (LIDEX) 0.05 % external solution Apply topically 2 (two) times daily. Apply drops to scalp for itch   furosemide (LASIX) 20 MG tablet TAKE 1 TABLET BY MOUTH DAILY AS NEEDED   gabapentin (NEURONTIN) 300 MG capsule Take two tablets tid (in am, midday and q hs).   HYDROcodone-acetaminophen (NORCO) 10-325 MG tablet Take 1 tablet by mouth 3 (three) times daily.   ketoconazole (NIZORAL) 2 % shampoo Apply 1 application topically See admin instructions. apply three times per week, massage into scalp and  leave in for 10 minutes before rinsing out   magnesium oxide (MAG-OX) 400 MG tablet Take 1 tablet (400 mg total) by mouth daily.   Melatonin 10 MG CAPS Take 1 capsule by mouth.   methocarbamol (ROBAXIN) 500 MG tablet Take 500 mg by mouth 3 (three) times daily. As needed   metoprolol succinate (TOPROL-XL) 50 MG 24 hr tablet TAKE 1 TABLET BY MOUTH DAILY. TAKE WITH OR IMMEDIATELY FOLLOWING A MEAL.   montelukast (SINGULAIR) 10 MG tablet 10 mg once daily as needed   morphine (MS CONTIN) 60 MG 12 hr tablet Take 1  tablet (60 mg total) by mouth 2 (two) times daily.   mupirocin ointment (BACTROBAN) 2 % Apply 1 application topically 2 (two) times daily.   mupirocin ointment (BACTROBAN) 2 % Apply 1 application topically daily. Apply to left knee until healed   Naldemedine Tosylate 0.2 MG TABS Take 0.2 mg by mouth daily.   nystatin (MYCOSTATIN/NYSTOP) powder Apply 1 application. topically 2 (two) times daily as needed.   polyethylene glycol powder (GLYCOLAX/MIRALAX) 17 GM/SCOOP powder MIX 17 GRAMS AS MARKED ON BOTTLE TOP IN 8 OUNCES OF WATER AND DRINK ONCE A DAY AS DIRECTED.   RABEprazole (ACIPHEX) 20 MG tablet TAKE 1 TABLET BY MOUTH TWICE A DAY BEFORE A MEAL   senna (SENOKOT) 8.6 MG TABS tablet Take 1 tablet by mouth 2 (two) times daily.   Simethicone (GAS-X PO) Take 2 tablets by mouth 2 (two) times daily.   sodium chloride (OCEAN) 0.65 % nasal spray Place 1 spray into the nose as needed.   SYNTHROID 100 MCG tablet Take 1 tablet (100 mcg total) by mouth daily.   triamcinolone ointment (KENALOG) 0.1 % APPLY TWICE DAILY TO BITES AND RASH UNTIL FLAT AND SMOOTH **DO NOT APPLY TO FACE**   [DISCONTINUED] NYAMYC powder Apply topically 2 (two) times daily.   [DISCONTINUED] budesonide-formoterol (SYMBICORT) 80-4.5 MCG/ACT inhaler Inhale 2 puffs into the lungs 2 (two) times daily. (Patient not taking: Reported on 01/21/2022)   No facility-administered encounter medications on file as of 01/21/2022.     Lab Results  Component Value Date   WBC 4.7 01/23/2022   HGB 10.2 (L) 01/23/2022   HCT 31.9 (L) 01/23/2022   PLT 122 (L) 01/23/2022   GLUCOSE 77 01/23/2022   CHOL 143 01/21/2022   TRIG 108.0 01/21/2022   HDL 57.90 01/21/2022   LDLCALC 63 01/21/2022   ALT 13 01/21/2022   AST 15 01/21/2022   NA 137 01/23/2022   K 4.5 01/23/2022   CL 98 01/23/2022   CREATININE 0.94 01/23/2022   BUN 25 (H) 01/23/2022   CO2 32 01/23/2022   TSH 4.63 01/21/2022   INR 0.89 08/06/2018   HGBA1C 6.0 10/19/2014    DG Tibia/Fibula  Right  Result Date: 12/12/2021 CLINICAL DATA:  Right knee and lower leg pain. History of right femur surgery. EXAM: RIGHT TIBIA AND FIBULA - 2 VIEW; RIGHT KNEE - COMPLETE 4+ VIEW COMPARISON:  Right knee radiographs 08/07/2018, right hip radiographs 08/06/2018 FINDINGS: There is again a lateral plate and screw fixation of the distal femur. The proximal aspect of the hardware is not imaged. There is increased chronic peripheral healing callus formation about the previously seen oblique distal femoral diaphyseal fracture. A single distal tibial cerclage wires also again noted. Status post total right knee arthroplasty. No perihardware lucency is seen to indicate hardware failure or loosening. No joint effusion. Mild chronic spurring at the quadriceps insertion on the patella, unchanged. No acute fracture is  seen within the tibia or fibula. Mild lateral tibiotalar joint space narrowing. Mild-to-moderate chronic plantar calcaneal heel spur. Mild chronic spurring at the Achilles insertion on the calcaneus. IMPRESSION:: IMPRESSION: 1. Partial visualization of distal femoral lateral plate and screw fixation hardware with progressive, apparent complete healing of the previously seen distal femoral diaphyseal fracture. 2. Status post total knee arthroplasty without evidence of hardware failure. 3. No acute fracture is seen. Electronically Signed   By: Yvonne Kendall M.D.   On: 12/12/2021 15:25   CT HEAD WO CONTRAST (5MM)  Result Date: 12/12/2021 CLINICAL DATA:  Headache, chronic, no new features h/a and scalp pain x weeks not improving h/o HTN r/o stroke EXAM: CT HEAD WITHOUT CONTRAST TECHNIQUE: Contiguous axial images were obtained from the base of the skull through the vertex without intravenous contrast. RADIATION DOSE REDUCTION: This exam was performed according to the departmental dose-optimization program which includes automated exposure control, adjustment of the mA and/or kV according to patient size and/or use  of iterative reconstruction technique. COMPARISON:  CT head Jan 20, 2016. FINDINGS: Brain: No evidence of acute large vascular territory infarction, hemorrhage, hydrocephalus, extra-axial collection or mass lesion/mass effect. Chronic cerebral atrophy. Patchy white matter hypodensities, nonspecific but compatible with chronic microvascular ischemic disease. Vascular: No hyperdense vessel identified. Calcific intracranial atherosclerosis. Skull: No acute fracture. Sinuses/Orbits: Chronic left sphenoid sinus disease with internal mineralization mucosal thickening, and osteitis. Internal hyperdensity. Other: No mastoid effusions. IMPRESSION: 1. No evidence of acute intracranial abnormality. 2. Chronic microvascular ischemic disease and cerebral atrophy. 3. Chronic left sphenoid sinus disease. Internal hyperdensity could represent inspissated secretions or fungal colonization. Electronically Signed   By: Margaretha Sheffield M.D.   On: 12/12/2021 16:23   US Venous Img Lower Bilateral  Result Date: 12/12/2021 CLINICAL DATA:  Bilateral lower leg pain and edema history of DVT EXAM: BILATERAL LOWER EXTREMITY VENOUS DOPPLER ULTRASOUND TECHNIQUE: Gray-scale sonography with graded compression, as well as color Doppler and duplex ultrasound were performed to evaluate the lower extremity deep venous systems from the level of the common femoral vein and including the common femoral, femoral, profunda femoral, popliteal and calf veins including the posterior tibial, peroneal and gastrocnemius veins when visible. The superficial great saphenous vein was also interrogated. Spectral Doppler was utilized to evaluate flow at rest and with distal augmentation maneuvers in the common femoral, femoral and popliteal veins. COMPARISON:  Right lower extremity duplex ultrasound 07/12/2015 FINDINGS: RIGHT LOWER EXTREMITY Common Femoral Vein: No evidence of thrombus. Normal compressibility, respiratory phasicity and response to augmentation.  Saphenofemoral Junction: No evidence of thrombus. Normal compressibility and flow on color Doppler imaging. Profunda Femoral Vein: No evidence of thrombus. Normal compressibility and flow on color Doppler imaging. Femoral Vein: No evidence of thrombus. Normal compressibility, respiratory phasicity and response to augmentation. Popliteal Vein: No evidence of thrombus. Normal compressibility, respiratory phasicity and response to augmentation. Calf Veins: No evidence of thrombus. Normal compressibility and flow on color Doppler imaging. Superficial Great Saphenous Vein: No evidence of thrombus. Normal compressibility. Venous Reflux:  None. Other Findings:  None. LEFT LOWER EXTREMITY Common Femoral Vein: No evidence of thrombus. Normal compressibility, respiratory phasicity and response to augmentation. Saphenofemoral Junction: No evidence of thrombus. Normal compressibility and flow on color Doppler imaging. Profunda Femoral Vein: No evidence of thrombus. Normal compressibility and flow on color Doppler imaging. Femoral Vein: No evidence of thrombus. Normal compressibility, respiratory phasicity and response to augmentation. Popliteal Vein: No evidence of thrombus. Normal compressibility, respiratory phasicity and response to augmentation. Calf Veins: No  evidence of thrombus. Normal compressibility and flow on color Doppler imaging. Superficial Great Saphenous Vein: No evidence of thrombus. Normal compressibility. Venous Reflux:  None. Other Findings:  None. IMPRESSION: No evidence of deep venous thrombosis in either lower extremity. Electronically Signed   By: Valetta Mole M.D.   On: 12/12/2021 15:58   DG Knee Complete 4 Views Right  Result Date: 12/12/2021 CLINICAL DATA:  Right knee and lower leg pain. History of right femur surgery. EXAM: RIGHT TIBIA AND FIBULA - 2 VIEW; RIGHT KNEE - COMPLETE 4+ VIEW COMPARISON:  Right knee radiographs 08/07/2018, right hip radiographs 08/06/2018 FINDINGS: There is again a  lateral plate and screw fixation of the distal femur. The proximal aspect of the hardware is not imaged. There is increased chronic peripheral healing callus formation about the previously seen oblique distal femoral diaphyseal fracture. A single distal tibial cerclage wires also again noted. Status post total right knee arthroplasty. No perihardware lucency is seen to indicate hardware failure or loosening. No joint effusion. Mild chronic spurring at the quadriceps insertion on the patella, unchanged. No acute fracture is seen within the tibia or fibula. Mild lateral tibiotalar joint space narrowing. Mild-to-moderate chronic plantar calcaneal heel spur. Mild chronic spurring at the Achilles insertion on the calcaneus. IMPRESSION:: IMPRESSION: 1. Partial visualization of distal femoral lateral plate and screw fixation hardware with progressive, apparent complete healing of the previously seen distal femoral diaphyseal fracture. 2. Status post total knee arthroplasty without evidence of hardware failure. 3. No acute fracture is seen. Electronically Signed   By: Yvonne Kendall M.D.   On: 12/12/2021 15:25       Assessment & Plan:   Problem List Items Addressed This Visit     Aortic atherosclerosis ([place])    Continue lipitor.        CHF (congestive heart failure) (HCC)    On lasix.  Lower extremity swelling improved.  No evidence of volume overload.  Follow.        Chronic respiratory failure with hypoxia (HCC)    Using oxygen at night.  Breathing baseline.        GERD (gastroesophageal reflux disease)    Continue aciphex.  Followed by GI.        Hyperlipidemia    On lipitor.  Low cholesterol diet and exercise.  Follow lipid panel and liver function tests.         Relevant Orders   Lipid Profile (Completed)   Hepatic function panel (Completed)   CBC with Differential/Platelet (Completed)   Hypertension - Primary    Continue amlodipine, lasix and metoprolol. Blood pressure as outlined.   Follow pressures.  Follow metabolic panel.        Relevant Orders   Basic Metabolic Panel (BMET) (Completed)   Hypothyroidism   Relevant Orders   TSH (Completed)   Left knee pain    Left knee pain and fulness as outlined.  Request referral back to ortho.         Relevant Orders   Ambulatory referral to Orthopedic Surgery   Leg swelling    Improved with compression hose.         Neuropathy    Continues on gabapentin.  Follow.        Pulmonary hypertension ([place])    Followed by pulmonary.  Continue nighttime oxygen.        Stress    Followed by psychiatry.  Appears to be stable.  Follow.          Kermit Arnette  Nicki Reaper, MD

## 2022-01-23 ENCOUNTER — Inpatient Hospital Stay: Payer: Medicare HMO | Attending: Internal Medicine

## 2022-01-23 ENCOUNTER — Encounter: Payer: Self-pay | Admitting: Internal Medicine

## 2022-01-23 ENCOUNTER — Inpatient Hospital Stay (HOSPITAL_BASED_OUTPATIENT_CLINIC_OR_DEPARTMENT_OTHER): Payer: Medicare HMO | Admitting: Internal Medicine

## 2022-01-23 ENCOUNTER — Inpatient Hospital Stay: Payer: Medicare HMO

## 2022-01-23 ENCOUNTER — Telehealth: Payer: Self-pay

## 2022-01-23 VITALS — BP 118/45 | HR 47 | Temp 97.7°F | Resp 16 | Wt 176.0 lb

## 2022-01-23 DIAGNOSIS — D500 Iron deficiency anemia secondary to blood loss (chronic): Secondary | ICD-10-CM

## 2022-01-23 DIAGNOSIS — Z86718 Personal history of other venous thrombosis and embolism: Secondary | ICD-10-CM | POA: Diagnosis not present

## 2022-01-23 DIAGNOSIS — Z79899 Other long term (current) drug therapy: Secondary | ICD-10-CM | POA: Diagnosis not present

## 2022-01-23 DIAGNOSIS — I272 Pulmonary hypertension, unspecified: Secondary | ICD-10-CM | POA: Diagnosis not present

## 2022-01-23 DIAGNOSIS — E039 Hypothyroidism, unspecified: Secondary | ICD-10-CM | POA: Diagnosis not present

## 2022-01-23 DIAGNOSIS — M549 Dorsalgia, unspecified: Secondary | ICD-10-CM | POA: Diagnosis not present

## 2022-01-23 DIAGNOSIS — Z823 Family history of stroke: Secondary | ICD-10-CM | POA: Insufficient documentation

## 2022-01-23 DIAGNOSIS — I509 Heart failure, unspecified: Secondary | ICD-10-CM | POA: Diagnosis not present

## 2022-01-23 DIAGNOSIS — Z881 Allergy status to other antibiotic agents status: Secondary | ICD-10-CM | POA: Insufficient documentation

## 2022-01-23 DIAGNOSIS — Z882 Allergy status to sulfonamides status: Secondary | ICD-10-CM | POA: Insufficient documentation

## 2022-01-23 DIAGNOSIS — Z888 Allergy status to other drugs, medicaments and biological substances status: Secondary | ICD-10-CM | POA: Diagnosis not present

## 2022-01-23 DIAGNOSIS — D539 Nutritional anemia, unspecified: Secondary | ICD-10-CM | POA: Insufficient documentation

## 2022-01-23 DIAGNOSIS — M1990 Unspecified osteoarthritis, unspecified site: Secondary | ICD-10-CM | POA: Diagnosis not present

## 2022-01-23 DIAGNOSIS — I110 Hypertensive heart disease with heart failure: Secondary | ICD-10-CM | POA: Diagnosis not present

## 2022-01-23 DIAGNOSIS — G4733 Obstructive sleep apnea (adult) (pediatric): Secondary | ICD-10-CM | POA: Diagnosis not present

## 2022-01-23 DIAGNOSIS — Z9049 Acquired absence of other specified parts of digestive tract: Secondary | ICD-10-CM | POA: Diagnosis not present

## 2022-01-23 DIAGNOSIS — M542 Cervicalgia: Secondary | ICD-10-CM | POA: Diagnosis not present

## 2022-01-23 DIAGNOSIS — M2550 Pain in unspecified joint: Secondary | ICD-10-CM | POA: Diagnosis not present

## 2022-01-23 DIAGNOSIS — Z8719 Personal history of other diseases of the digestive system: Secondary | ICD-10-CM | POA: Diagnosis not present

## 2022-01-23 DIAGNOSIS — Z8249 Family history of ischemic heart disease and other diseases of the circulatory system: Secondary | ICD-10-CM | POA: Insufficient documentation

## 2022-01-23 DIAGNOSIS — R5383 Other fatigue: Secondary | ICD-10-CM | POA: Diagnosis not present

## 2022-01-23 LAB — CBC WITH DIFFERENTIAL/PLATELET
Abs Immature Granulocytes: 0.01 10*3/uL (ref 0.00–0.07)
Basophils Absolute: 0 10*3/uL (ref 0.0–0.1)
Basophils Relative: 0 %
Eosinophils Absolute: 0.2 10*3/uL (ref 0.0–0.5)
Eosinophils Relative: 4 %
HCT: 31.9 % — ABNORMAL LOW (ref 36.0–46.0)
Hemoglobin: 10.2 g/dL — ABNORMAL LOW (ref 12.0–15.0)
Immature Granulocytes: 0 %
Lymphocytes Relative: 30 %
Lymphs Abs: 1.4 10*3/uL (ref 0.7–4.0)
MCH: 33.6 pg (ref 26.0–34.0)
MCHC: 32 g/dL (ref 30.0–36.0)
MCV: 104.9 fL — ABNORMAL HIGH (ref 80.0–100.0)
Monocytes Absolute: 0.4 10*3/uL (ref 0.1–1.0)
Monocytes Relative: 9 %
Neutro Abs: 2.7 10*3/uL (ref 1.7–7.7)
Neutrophils Relative %: 57 %
Platelets: 122 10*3/uL — ABNORMAL LOW (ref 150–400)
RBC: 3.04 MIL/uL — ABNORMAL LOW (ref 3.87–5.11)
RDW: 13.8 % (ref 11.5–15.5)
WBC: 4.7 10*3/uL (ref 4.0–10.5)
nRBC: 0 % (ref 0.0–0.2)

## 2022-01-23 LAB — BASIC METABOLIC PANEL
Anion gap: 7 (ref 5–15)
BUN: 25 mg/dL — ABNORMAL HIGH (ref 8–23)
CO2: 32 mmol/L (ref 22–32)
Calcium: 9.3 mg/dL (ref 8.9–10.3)
Chloride: 98 mmol/L (ref 98–111)
Creatinine, Ser: 0.94 mg/dL (ref 0.44–1.00)
GFR, Estimated: 58 mL/min — ABNORMAL LOW (ref >60)
Glucose, Bld: 77 mg/dL (ref 70–99)
Potassium: 4.5 mmol/L (ref 3.5–5.1)
Sodium: 137 mmol/L (ref 135–145)

## 2022-01-23 LAB — LACTATE DEHYDROGENASE: LDH: 129 U/L (ref 98–192)

## 2022-01-23 NOTE — Assessment & Plan Note (Signed)
#  Chronic anemia-unclear etiology-   Mild symptoms of ongoing fatigue.  JAN 2023--ferritin 101 saturation 41%%.; HOLD venofer today.  #Etiology of macrocytic anemia is unclear: Again discussed with patient regarding possibility of low-grade leukemia/MDS. Discussed with the patient the bone marrow biopsy and aspiration indication and procedure at length.  Given significant discomfort involved-I would recommend under anesthesia/with radiology in the hospital. I discussed the potential complications include-bleeding/trauma and risk of infection; which are fortunately very rare.  Patient is in agreement. Patient will sign the consent prior to the procedure. Bone marrow biopsy/aspiration is ordered.   Care giver-[place] # DISPOSITION:  # bone marrow biospy in 2 weeks # HOLD venofer today # follow up in 1 months- MD; labs-cbc/bmp;LDH; possible retacritDr.B

## 2022-01-23 NOTE — Progress Notes (Unsigned)
[facility] NOTE  Patient Care Team: Einar Pheasant, [name], [name], [name], [name], [name], [name]  CHIEF COMPLAINTS/PURPOSE OF CONSULTATION: Anemia  # CHRONIC MILD ANEMIA hb ~11.[Previous Dr.Gittin pt]; [place] 2017- work up [place] [employer]; stool card; Iron/ monoclonal work up- NEGATIVE] ; MARCH 2022- B12 WNL.   # CHRONIC INTERSTITIAL CYSTITIS/ wheel chair bound  Oncology History   No history exists.    HISTORY OF PRESENTING ILLNESS: alone; in wheel chair.  Kimberly Roy 88 y.o.  female  with a history of chronic anemia- is here for a follow up.   Patient denies new problems/concerns today.  Patient is currently awaiting work-up with orthopedics/[facility] for neck pain .    Patient continues to complain of chronic moderate fatigue.  Notes no significant improvement after iron infusion.. Denies any blood in stools or black-colored stools.  No nausea no vomiting.  No abdominal pain.  No easy bleeding/bruising noted.   Review of Systems  Constitutional:  Positive for malaise/fatigue. Negative for chills, diaphoresis and fever.  HENT:  Negative for nosebleeds and sore throat.   Eyes:  Negative for double vision.  Respiratory:  Negative for cough, hemoptysis, sputum production, shortness of breath and wheezing.   Cardiovascular:  Negative for chest pain, palpitations, orthopnea and leg swelling.  Gastrointestinal:  Negative for abdominal pain, blood in stool, constipation, diarrhea, heartburn, melena, nausea and vomiting.  Genitourinary:  Negative for dysuria, frequency and urgency.  Musculoskeletal:  Positive for back pain and joint pain.  Skin: Negative.  Negative for itching and rash.  Neurological:  Negative for dizziness, tingling, focal  weakness, weakness and headaches.  Endo/Heme/Allergies:  Does not bruise/bleed easily.  Psychiatric/Behavioral:  Negative for depression. The patient is not nervous/anxious and does not have insomnia.     MEDICAL HISTORY:  Past Medical History:  Diagnosis Date   Anemia    Anxiety    Chest pain    CHF (congestive heart failure) (HCC)    Constipation    DDD (degenerative disc disease), cervical    Depression    DVT (deep venous thrombosis) (HCC)    Dysphonia    Dyspnea    Fatty liver    Fatty liver    Headache    Hyperlipidemia    Hyperpiesia    Hypertension    Hypothyroidism    Interstitial cystitis    Left ventricular dysfunction    Lymphedema    Nephrolithiasis    Obstructive sleep apnea    Osteoarthritis    knees/cervical and lumbar spine   Pulmonary hypertension (HCC)    Pulmonary nodules    followed by Dr Raul Del   Pure hypercholesterolemia    Renal cyst    right    SURGICAL HISTORY: Past Surgical History:  Procedure Laterality Date   ABDOMINAL HYSTERECTOMY     ovaries left in place   APPENDECTOMY     Back Surgeries     BACK SURGERY     BREAST REDUCTION SURGERY     3/99   CARDIAC CATHETERIZATION     cataracts Bilateral    CERVICAL SPINE SURGERY     ESOPHAGEAL MANOMETRY N/A 08/02/2015   Procedure: ESOPHAGEAL MANOMETRY (EM);  Surgeon: Josefine Class, MD;  Location: [facility] ENDOSCOPY;  Service: Endoscopy;  Laterality: N/A;   ESOPHAGOGASTRODUODENOSCOPY N/A 02/27/2015   Procedure:  ESOPHAGOGASTRODUODENOSCOPY (EGD);  Surgeon: Hulen Luster, MD;  Location: [facility] ENDOSCOPY;  Service: Gastroenterology;  Laterality: N/A;   ESOPHAGOGASTRODUODENOSCOPY (EGD) WITH PROPOFOL N/A 10/30/2018   Procedure: ESOPHAGOGASTRODUODENOSCOPY (EGD) WITH PROPOFOL;  Surgeon: Lollie Sails, MD;  Location: [facility] ENDOSCOPY;  Service: Endoscopy;  Laterality: N/A;   EXCISIONAL HEMORRHOIDECTOMY     EYE SURGERY     FRACTURE SURGERY     HEMORRHOID SURGERY     HIP SURGERY  2013   Right hip  surgery   JOINT REPLACEMENT     KNEE ARTHROSCOPY     left and right   ORIF FEMUR FRACTURE Right 08/07/2018   Procedure: OPEN REDUCTION INTERNAL FIXATION (ORIF) DISTAL FEMUR FRACTURE;  Surgeon: Hessie Knows, MD;  Location: [facility] ORS;  Service: Orthopedics;  Laterality: Right;   REDUCTION MAMMAPLASTY Bilateral YRS AGO   REPLACEMENT TOTAL KNEE Bilateral    rotator cuff surgery     blilateral   TONSILECTOMY/ADENOIDECTOMY WITH MYRINGOTOMY     VISCERAL ARTERY INTERVENTION N/A 02/25/2019   Procedure: VISCERAL ARTERY INTERVENTION;  Surgeon: Algernon Huxley, MD;  Location: [facility];  Service: Cardiovascular;  Laterality: N/A;    SOCIAL HISTORY: Social History   Socioeconomic History   Marital status: Widowed    Spouse name: Not on file   Number of children: 2   Years of education: 41   Highest education level: 12th grade  Occupational History    Comment: insurance agency  Tobacco Use   Smoking status: Never   Smokeless tobacco: Never  Vaping Use   Vaping Use: Never used  Substance and Sexual Activity   Alcohol use: No    Alcohol/week: 0.0 standard drinks   Drug use: Never   Sexual activity: Not Currently  Other Topics Concern   Not on file  Social History Narrative   Not on file   Social Determinants of Health   Financial Resource Strain: Low Risk    Difficulty of Paying Living Expenses: Not hard at all  Food Insecurity: No Food Insecurity   Worried About Charity fundraiser in the Last Year: Never true   Arboriculturist in the Last Year: Never true  Transportation Needs: Unmet Transportation Needs   Lack of Transportation (Medical): Yes   Lack of Transportation (Non-Medical): Yes  Physical Activity: Inactive   Days of Exercise per Week: 0 days   Minutes of Exercise per Session: 0 min  Stress: No Stress Concern Present   Feeling of Stress : Only a little  Social Connections: Socially Isolated   Frequency of Communication with Friends and Family: Three times a  week   Frequency of Social Gatherings with Friends and Family: Twice a week   Attends Religious Services: Never   Marine scientist or Organizations: No   Attends Archivist Meetings: Never   Marital Status: Widowed  Human resources officer Violence: Not At Risk   Fear of Current or Ex-Partner: No   Emotionally Abused: No   Physically Abused: No   Sexually Abused: No    FAMILY HISTORY: Family History  Problem Relation Age of Onset   Heart disease Mother    Stroke Mother    Hypertension Mother    Heart disease Father        myocardial infarction age 12   Breast cancer Neg Hx     ALLERGIES:  is allergic to lyrica [pregabalin], omnicef [cefdinir], atarax [hydroxyzine], dicyclomine, hydroxyzine hcl, levaquin [levofloxacin], nitrofurantoin, nucynta er [tapentadol hcl er], oxybutynin, zoloft [  sertraline hcl], biaxin [clarithromycin], sertraline, sulfa antibiotics, sulfasalazine, tape, and tapentadol.  MEDICATIONS:  Current Outpatient Medications  Medication Sig Dispense Refill   albuterol (VENTOLIN HFA) 108 (90 Base) MCG/ACT inhaler INHALE 2 PUFFS INTO THE LUNGS EVERY 6 (SIX) HOURS AS NEEDED FOR WHEEZING OR SHORTNESS OF BREATH 18 g 0   aluminum-magnesium hydroxide-simethicone (MAALOX) 330-076-22 MG/5ML SUSP Take 15 mLs by mouth 4 (four) times daily -  before meals and at bedtime.      amLODipine (NORVASC) 10 MG tablet TAKE 1 TABLET BY MOUTH DAILY 90 tablet 1   aspirin 81 MG EC tablet Take by mouth.     atorvastatin (LIPITOR) 20 MG tablet TAKE 1 TABLET BY MOUTH AT BEDTIME. 90 tablet 1   Azelastine HCl 137 MCG/SPRAY SOLN PLACE 1 SPRAY INTO BOTH NOSTRILS TWO TIMES DAILY 30 mL 1   benazepril (LOTENSIN) 40 MG tablet TAKE 1 TABLET BY MOUTH DAILY 90 tablet 1   betamethasone dipropionate 0.05 % cream Apply 1 application topically 2 (two) times daily.     busPIRone (BUSPAR) 10 MG tablet Take 10 mg by mouth 2 (two) times daily.      calcium citrate-vitamin D (CITRACAL+D) 315-200 MG-UNIT  tablet Take 1 tablet by mouth daily.     Cholecalciferol (VITAMIN D3) 1000 UNITS CAPS Take 1 capsule by mouth daily.     clobetasol cream (TEMOVATE) 6.33 % Apply 1 application topically See admin instructions. Apply to affected areas of body 1 - 2 times daily as needed for itchy bumps. Avoid applying to face, groin, and axilla. Use as directed. 60 g 1   Diclofenac Sodium 3 % GEL Apply topically 2 (two) times daily.     Docusate Sodium (DSS) 100 MG CAPS Take by mouth.     ELMIRON 100 MG capsule Take 100 mg by mouth 3 (three) times daily.     Fluocinolone Acetonide 0.01 % OIL Apply 1-2 drops into ears once to twice daily as needed. 20 mL 3   fluocinonide (LIDEX) 0.05 % external solution Apply topically 2 (two) times daily. Apply drops to scalp for itch 60 mL 3   furosemide (LASIX) 20 MG tablet TAKE 1 TABLET BY MOUTH DAILY AS NEEDED 90 tablet 0   gabapentin (NEURONTIN) 300 MG capsule Take two tablets tid (in am, midday and q hs). 180 capsule 2   HYDROcodone-acetaminophen (NORCO) 10-325 MG tablet Take 1 tablet by mouth 3 (three) times daily. 10 tablet 0   ketoconazole (NIZORAL) 2 % shampoo Apply 1 application topically See admin instructions. apply three times per week, massage into scalp and leave in for 10 minutes before rinsing out 120 mL 3   magnesium oxide (MAG-OX) 400 MG tablet Take 1 tablet (400 mg total) by mouth daily. 30 tablet 1   Melatonin 10 MG CAPS Take 1 capsule by mouth.     methocarbamol (ROBAXIN) 500 MG tablet Take 500 mg by mouth 3 (three) times daily. As needed     metoprolol succinate (TOPROL-XL) 50 MG 24 hr tablet TAKE 1 TABLET BY MOUTH DAILY. TAKE WITH OR IMMEDIATELY FOLLOWING A MEAL. 90 tablet 1   montelukast (SINGULAIR) 10 MG tablet 10 mg once daily as needed     morphine (MS CONTIN) 60 MG 12 hr tablet Take 1 tablet (60 mg total) by mouth 2 (two) times daily. 10 tablet 0   mupirocin ointment (BACTROBAN) 2 % Apply 1 application topically 2 (two) times daily. 22 g 0   mupirocin  ointment (BACTROBAN) 2 % Apply 1  application topically daily. Apply to left knee until healed 22 g 0   Naldemedine Tosylate 0.2 MG TABS Take 0.2 mg by mouth daily.     nystatin (MYCOSTATIN/NYSTOP) powder Apply 1 application. topically 2 (two) times daily as needed. 60 g 0   polyethylene glycol powder (GLYCOLAX/MIRALAX) 17 GM/SCOOP powder MIX 17 GRAMS AS MARKED ON BOTTLE TOP IN 8 OUNCES OF WATER AND DRINK ONCE A DAY AS DIRECTED. 527 g 0   RABEprazole (ACIPHEX) 20 MG tablet TAKE 1 TABLET BY MOUTH TWICE A DAY BEFORE A MEAL 180 tablet 1   senna (SENOKOT) 8.6 MG TABS tablet Take 1 tablet by mouth 2 (two) times daily.     Simethicone (GAS-X PO) Take 2 tablets by mouth 2 (two) times daily.     sodium chloride (OCEAN) 0.65 % nasal spray Place 1 spray into the nose as needed.     SYNTHROID 100 MCG tablet Take 1 tablet (100 mcg total) by mouth daily. 90 tablet 3   triamcinolone ointment (KENALOG) 0.1 % APPLY TWICE DAILY TO BITES AND RASH UNTIL FLAT AND SMOOTH **DO NOT APPLY TO FACE** 80 g 0   budesonide-formoterol (SYMBICORT) 80-4.5 MCG/ACT inhaler Inhale 2 puffs into the lungs 2 (two) times daily. (Patient not taking: Reported on 01/21/2022) 1 each 12   LINZESS 145 MCG CAPS capsule Take 145 mcg by mouth daily. (Patient not taking: Reported on 01/23/2022)     No current facility-administered medications for this visit.      Marland Kitchen  PHYSICAL EXAMINATION: ECOG PERFORMANCE STATUS: 0 - Asymptomatic  Vitals:   01/23/22 1300  BP: (!) 118/45  Pulse: (!) 47  Resp: 16  Temp: 97.7 F (36.5 C)   Filed Weights   01/23/22 1300  Weight: 176 lb (79.8 kg)    Physical Exam Constitutional:      Comments: Patient in wheelchair because of chronic arthritic problems.  HENT:     Head: Normocephalic and atraumatic.     Mouth/Throat:     Pharynx: No oropharyngeal exudate.  Eyes:     Pupils: Pupils are equal, round, and reactive to light.  Cardiovascular:     Rate and Rhythm: Normal rate and regular rhythm.   Pulmonary:     Effort: Pulmonary effort is normal. No respiratory distress.     Breath sounds: Normal breath sounds. No wheezing.  Abdominal:     General: Bowel sounds are normal. There is no distension.     Palpations: Abdomen is soft. There is no mass.     Tenderness: There is no abdominal tenderness. There is no guarding or rebound.  Musculoskeletal:        General: No tenderness. Normal range of motion.     Cervical back: Normal range of motion and neck supple.  Skin:    General: Skin is warm.  Neurological:     Mental Status: She is alert and oriented to person, place, and time.  Psychiatric:        Mood and Affect: Affect normal.    LABORATORY DATA:  I have reviewed the data as listed Lab Results  Component Value Date   WBC 4.7 01/23/2022   HGB 10.2 (L) 01/23/2022   HCT 31.9 (L) 01/23/2022   MCV 104.9 (H) 01/23/2022   PLT 122 (L) 01/23/2022   Recent Labs    05/14/21 1351 06/27/21 1236 09/26/21 1317 10/09/21 1258 10/10/21 1419 01/21/22 1200 01/23/22 1256  NA 143 137 138 141  --  140 137  K 4.4 4.4 3.9  5.3* 4.7 4.7 4.5  CL 100 98 99 101  --  101 98  CO2 35* 33* 31 37*  --  33* 32  GLUCOSE 86 76 78 73  --  84 77  BUN 16 24* 14 23  --  23 25*  CREATININE 0.77 0.93 0.69 0.97  --  1.02 0.94  CALCIUM 10.2 9.3 9.7 9.7  --  10.0 9.3  GFRNONAA  --  59* >60  --   --   --  58*  PROT 6.4 6.5  --   --   --  6.4  --   ALBUMIN 4.0 4.0  --   --   --  4.3  --   AST 17 20  --   --   --  15  --   ALT 11 15  --   --   --  13  --   ALKPHOS 78 72  --   --   --  96  --   BILITOT 0.4 0.7  --   --   --  0.4  --   BILIDIR 0.1  --   --   --   --  0.1  --     RADIOGRAPHIC STUDIES: I have personally reviewed the radiological images as listed and agreed with the findings in the report. No results found.  ASSESSMENT & PLAN:   Anemia due to blood loss, chronic #  Chronic anemia-unclear etiology-   Mild symptoms of ongoing fatigue.  JAN 2023--ferritin 101 saturation 41%%.; HOLD  venofer today.  #Etiology of macrocytic anemia is unclear: Again discussed with patient regarding possibility of low-grade leukemia/MDS. Discussed with the patient the bone marrow biopsy and aspiration indication and procedure at length.  Given significant discomfort involved-I would recommend under anesthesia/with radiology in the hospital. I discussed the potential complications include-bleeding/trauma and risk of infection; which are fortunately very rare.  Patient is in agreement. Patient will sign the consent prior to the procedure. Bone marrow biopsy/aspiration is ordered.  Discussed that if patient has a low-grade MDS-we will recommend Retacrit.   Care giver-[place] # DISPOSITION:  # bone marrow biospy in 2 weeks # HOLD venofer today # follow up in 1 months- MD; labs-cbc/bmp;LDH; possible retacritDr.B  Addendum: The above plan of care was discussed with patient's daughter [name] at length.  She is in agreement.  Recommend to be available for the next visit/to review bone marrow results.     Cammie Sickle, MD 01/24/2022 8:49 PM

## 2022-01-23 NOTE — Telephone Encounter (Signed)
Schedule request for bone marrow biopsy (in 2 weeks) faxed to specialty scheduling.

## 2022-01-23 NOTE — Progress Notes (Unsigned)
Patient denies new problems/concerns today.    Patient going thru work up for neck pain (5/10 pain scale) with [facility].  Bp in office check 118/45, HR 47

## 2022-01-24 ENCOUNTER — Encounter: Payer: Self-pay | Admitting: Internal Medicine

## 2022-01-24 NOTE — Progress Notes (Signed)
I spoke to patient daughter, [name]. Explained at length the rationale for bone marrow, biopsy/possible low grade leukemia. Recommend follow up discuss further details, when biopsy available.

## 2022-01-28 ENCOUNTER — Encounter: Payer: Self-pay | Admitting: Internal Medicine

## 2022-01-28 NOTE — Assessment & Plan Note (Signed)
On lasix.  Lower extremity swelling improved.  No evidence of volume overload.  Follow.

## 2022-01-28 NOTE — Assessment & Plan Note (Signed)
On lipitor.  Low cholesterol diet and exercise.  Follow lipid panel and liver function tests.   

## 2022-01-28 NOTE — Assessment & Plan Note (Signed)
Followed by psychiatry.  Appears to be stable.  Follow.  °

## 2022-01-28 NOTE — Assessment & Plan Note (Signed)
Continue aciphex.  Followed by GI.

## 2022-01-28 NOTE — Assessment & Plan Note (Signed)
Continue lipitor.

## 2022-01-28 NOTE — Assessment & Plan Note (Signed)
Left knee pain and fulness as outlined.  Request referral back to ortho.

## 2022-01-28 NOTE — Assessment & Plan Note (Signed)
Continues on gabapentin.  Follow.

## 2022-01-28 NOTE — Assessment & Plan Note (Signed)
Using oxygen at night.  Breathing baseline.

## 2022-01-28 NOTE — Assessment & Plan Note (Signed)
Followed by pulmonary.  Continue nighttime oxygen.

## 2022-01-28 NOTE — Assessment & Plan Note (Signed)
Improved with compression hose.

## 2022-01-28 NOTE — Assessment & Plan Note (Signed)
Continue amlodipine, lasix and metoprolol. Blood pressure as outlined.  Follow pressures.  Follow metabolic panel.  °

## 2022-01-29 ENCOUNTER — Telehealth: Payer: Self-pay | Admitting: Internal Medicine

## 2022-01-29 ENCOUNTER — Ambulatory Visit (INDEPENDENT_AMBULATORY_CARE_PROVIDER_SITE_OTHER): Payer: Medicare HMO

## 2022-01-29 ENCOUNTER — Ambulatory Visit (INDEPENDENT_AMBULATORY_CARE_PROVIDER_SITE_OTHER): Payer: Medicare HMO | Admitting: Internal Medicine

## 2022-01-29 ENCOUNTER — Encounter: Payer: Self-pay | Admitting: Internal Medicine

## 2022-01-29 DIAGNOSIS — M79671 Pain in right foot: Secondary | ICD-10-CM

## 2022-01-29 DIAGNOSIS — I10 Essential (primary) hypertension: Secondary | ICD-10-CM | POA: Diagnosis not present

## 2022-01-29 DIAGNOSIS — S9031XA Contusion of right foot, initial encounter: Secondary | ICD-10-CM | POA: Diagnosis not present

## 2022-01-29 DIAGNOSIS — M7731 Calcaneal spur, right foot: Secondary | ICD-10-CM | POA: Diagnosis not present

## 2022-01-29 NOTE — Telephone Encounter (Signed)
Patient notified that bone marrow bx scheduled on 02/07/2022 arrive at 8:00 am for 9:00 am appt and IR nurse will be calling to go over any special instructions.

## 2022-01-29 NOTE — Progress Notes (Unsigned)
Patient ID: Kimberly Roy, female   DOB: 06-20-1931, 88 y.o.   MRN: [id number]   Subjective:    Patient ID: Kimberly Roy, female    DOB: 1931-07-11, 88 y.o.   MRN: [id number]  This visit occurred during the SARS-CoV-2 public health emergency.  Safety protocols were in place, including screening questions prior to the visit, additional usage of staff PPE, and extensive cleaning of exam room while observing appropriate contact time as indicated for disinfecting solutions.   Patient here for  Chief Complaint  Patient presents with   Acute Visit   .   HPI    Past Medical History:  Diagnosis Date   Anemia    Anxiety    Chest pain    CHF (congestive heart failure) (HCC)    Constipation    DDD (degenerative disc disease), cervical    Depression    DVT (deep venous thrombosis) (HCC)    Dysphonia    Dyspnea    Fatty liver    Fatty liver    Headache    Hyperlipidemia    Hyperpiesia    Hypertension    Hypothyroidism    Interstitial cystitis    Left ventricular dysfunction    Lymphedema    Nephrolithiasis    Obstructive sleep apnea    Osteoarthritis    knees/cervical and lumbar spine   Pulmonary hypertension (HCC)    Pulmonary nodules    followed by Dr Raul Del   Pure hypercholesterolemia    Renal cyst    right   Past Surgical History:  Procedure Laterality Date   ABDOMINAL HYSTERECTOMY     ovaries left in place   APPENDECTOMY     Back Surgeries     BACK SURGERY     BREAST REDUCTION SURGERY     3/99   CARDIAC CATHETERIZATION     cataracts Bilateral    CERVICAL SPINE SURGERY     ESOPHAGEAL MANOMETRY N/A 08/02/2015   Procedure: ESOPHAGEAL MANOMETRY (EM);  Surgeon: Josefine Class, MD;  Location: [facility] ENDOSCOPY;  Service: Endoscopy;  Laterality: N/A;   ESOPHAGOGASTRODUODENOSCOPY N/A 02/27/2015   Procedure: ESOPHAGOGASTRODUODENOSCOPY (EGD);  Surgeon: Hulen Luster, MD;  Location: [facility] ENDOSCOPY;  Service: Gastroenterology;  Laterality: N/A;   ESOPHAGOGASTRODUODENOSCOPY (EGD)  WITH PROPOFOL N/A 10/30/2018   Procedure: ESOPHAGOGASTRODUODENOSCOPY (EGD) WITH PROPOFOL;  Surgeon: Lollie Sails, MD;  Location: [facility] ENDOSCOPY;  Service: Endoscopy;  Laterality: N/A;   EXCISIONAL HEMORRHOIDECTOMY     EYE SURGERY     FRACTURE SURGERY     HEMORRHOID SURGERY     HIP SURGERY  2013   Right hip surgery   JOINT REPLACEMENT     KNEE ARTHROSCOPY     left and right   ORIF FEMUR FRACTURE Right 08/07/2018   Procedure: OPEN REDUCTION INTERNAL FIXATION (ORIF) DISTAL FEMUR FRACTURE;  Surgeon: Hessie Knows, MD;  Location: [facility] ORS;  Service: Orthopedics;  Laterality: Right;   REDUCTION MAMMAPLASTY Bilateral YRS AGO   REPLACEMENT TOTAL KNEE Bilateral    rotator cuff surgery     blilateral   TONSILECTOMY/ADENOIDECTOMY WITH MYRINGOTOMY     VISCERAL ARTERY INTERVENTION N/A 02/25/2019   Procedure: VISCERAL ARTERY INTERVENTION;  Surgeon: Algernon Huxley, MD;  Location: [place] [facility];  Service: Cardiovascular;  Laterality: N/A;   Family History  Problem Relation Age of Onset   Heart disease Mother    Stroke Mother    Hypertension Mother    Heart disease Father        myocardial  infarction age 53   Breast cancer Neg Hx    Social History   Socioeconomic History   Marital status: Widowed    Spouse name: Not on file   Number of children: 2   Years of education: 71   Highest education level: 12th grade  Occupational History    Comment: insurance agency  Tobacco Use   Smoking status: Never   Smokeless tobacco: Never  Vaping Use   Vaping Use: Never used  Substance and Sexual Activity   Alcohol use: No    Alcohol/week: 0.0 standard drinks   Drug use: Never   Sexual activity: Not Currently  Other Topics Concern   Not on file  Social History Narrative   Not on file   Social Determinants of Health   Financial Resource Strain: Low Risk    Difficulty of Paying Living Expenses: Not hard at all  Food Insecurity: No Food Insecurity   Worried About Charity fundraiser in  the Last Year: Never true   Arboriculturist in the Last Year: Never true  Transportation Needs: Unmet Transportation Needs   Lack of Transportation (Medical): Yes   Lack of Transportation (Non-Medical): Yes  Physical Activity: Inactive   Days of Exercise per Week: 0 days   Minutes of Exercise per Session: 0 min  Stress: No Stress Concern Present   Feeling of Stress : Only a little  Social Connections: Socially Isolated   Frequency of Communication with Friends and Family: Three times a week   Frequency of Social Gatherings with Friends and Family: Twice a week   Attends Religious Services: Never   Marine scientist or Organizations: No   Attends Archivist Meetings: Never   Marital Status: Widowed     Review of Systems     Objective:     Ht 5' 2" (1.575 m)   Wt 176 lb (79.8 kg)   BMI 32.19 kg/m  Wt Readings from Last 3 Encounters:  01/29/22 176 lb (79.8 kg)  01/23/22 176 lb (79.8 kg)  01/21/22 176 lb 6.4 oz (80 kg)    Physical Exam   Outpatient Encounter Medications as of 01/29/2022  Medication Sig   albuterol (VENTOLIN HFA) 108 (90 Base) MCG/ACT inhaler INHALE 2 PUFFS INTO THE LUNGS EVERY 6 (SIX) HOURS AS NEEDED FOR WHEEZING OR SHORTNESS OF BREATH   aluminum-magnesium hydroxide-simethicone (MAALOX) 200-200-20 MG/5ML SUSP Take 15 mLs by mouth 4 (four) times daily -  before meals and at bedtime.    amLODipine (NORVASC) 10 MG tablet TAKE 1 TABLET BY MOUTH DAILY   aspirin 81 MG EC tablet Take by mouth.   atorvastatin (LIPITOR) 20 MG tablet TAKE 1 TABLET BY MOUTH AT BEDTIME.   Azelastine HCl 137 MCG/SPRAY SOLN PLACE 1 SPRAY INTO BOTH NOSTRILS TWO TIMES DAILY   benazepril (LOTENSIN) 40 MG tablet TAKE 1 TABLET BY MOUTH DAILY   betamethasone dipropionate 0.05 % cream Apply 1 application topically 2 (two) times daily.   busPIRone (BUSPAR) 10 MG tablet Take 10 mg by mouth 2 (two) times daily.    calcium citrate-vitamin D (CITRACAL+D) 315-200 MG-UNIT tablet Take  1 tablet by mouth daily.   Cholecalciferol (VITAMIN D3) 1000 UNITS CAPS Take 1 capsule by mouth daily.   clobetasol cream (TEMOVATE) 2.72 % Apply 1 application topically See admin instructions. Apply to affected areas of body 1 - 2 times daily as needed for itchy bumps. Avoid applying to face, groin, and axilla. Use as directed.  Diclofenac Sodium 3 % GEL Apply topically 2 (two) times daily.   Docusate Sodium (DSS) 100 MG CAPS Take by mouth.   ELMIRON 100 MG capsule Take 100 mg by mouth 3 (three) times daily.   Fluocinolone Acetonide 0.01 % OIL Apply 1-2 drops into ears once to twice daily as needed.   fluocinonide (LIDEX) 0.05 % external solution Apply topically 2 (two) times daily. Apply drops to scalp for itch   furosemide (LASIX) 20 MG tablet TAKE 1 TABLET BY MOUTH DAILY AS NEEDED   gabapentin (NEURONTIN) 300 MG capsule Take two tablets tid (in am, midday and q hs).   HYDROcodone-acetaminophen (NORCO) 10-325 MG tablet Take 1 tablet by mouth 3 (three) times daily.   ketoconazole (NIZORAL) 2 % shampoo Apply 1 application topically See admin instructions. apply three times per week, massage into scalp and leave in for 10 minutes before rinsing out   LINZESS 145 MCG CAPS capsule Take 145 mcg by mouth daily.   magnesium oxide (MAG-OX) 400 MG tablet Take 1 tablet (400 mg total) by mouth daily.   Melatonin 10 MG CAPS Take 1 capsule by mouth.   methocarbamol (ROBAXIN) 500 MG tablet Take 500 mg by mouth 3 (three) times daily. As needed   metoprolol succinate (TOPROL-XL) 50 MG 24 hr tablet TAKE 1 TABLET BY MOUTH DAILY. TAKE WITH OR IMMEDIATELY FOLLOWING A MEAL.   montelukast (SINGULAIR) 10 MG tablet 10 mg once daily as needed   morphine (MS CONTIN) 60 MG 12 hr tablet Take 1 tablet (60 mg total) by mouth 2 (two) times daily.   mupirocin ointment (BACTROBAN) 2 % Apply 1 application topically 2 (two) times daily.   mupirocin ointment (BACTROBAN) 2 % Apply 1 application topically daily. Apply to left knee  until healed   Naldemedine Tosylate 0.2 MG TABS Take 0.2 mg by mouth daily.   nystatin (MYCOSTATIN/NYSTOP) powder Apply 1 application. topically 2 (two) times daily as needed.   polyethylene glycol powder (GLYCOLAX/MIRALAX) 17 GM/SCOOP powder MIX 17 GRAMS AS MARKED ON BOTTLE TOP IN 8 OUNCES OF WATER AND DRINK ONCE A DAY AS DIRECTED.   RABEprazole (ACIPHEX) 20 MG tablet TAKE 1 TABLET BY MOUTH TWICE A DAY BEFORE A MEAL   senna (SENOKOT) 8.6 MG TABS tablet Take 1 tablet by mouth 2 (two) times daily.   Simethicone (GAS-X PO) Take 2 tablets by mouth 2 (two) times daily.   sodium chloride (OCEAN) 0.65 % nasal spray Place 1 spray into the nose as needed.   SYNTHROID 100 MCG tablet Take 1 tablet (100 mcg total) by mouth daily.   triamcinolone ointment (KENALOG) 0.1 % APPLY TWICE DAILY TO BITES AND RASH UNTIL FLAT AND SMOOTH **DO NOT APPLY TO FACE**   No facility-administered encounter medications on file as of 01/29/2022.     Lab Results  Component Value Date   WBC 4.7 01/23/2022   HGB 10.2 (L) 01/23/2022   HCT 31.9 (L) 01/23/2022   PLT 122 (L) 01/23/2022   GLUCOSE 77 01/23/2022   CHOL 143 01/21/2022   TRIG 108.0 01/21/2022   HDL 57.90 01/21/2022   LDLCALC 63 01/21/2022   ALT 13 01/21/2022   AST 15 01/21/2022   NA 137 01/23/2022   K 4.5 01/23/2022   CL 98 01/23/2022   CREATININE 0.94 01/23/2022   BUN 25 (H) 01/23/2022   CO2 32 01/23/2022   TSH 4.63 01/21/2022   INR 0.89 08/06/2018   HGBA1C 6.0 10/19/2014    DG Tibia/Fibula Right  Result Date:  12/12/2021 CLINICAL DATA:  Right knee and lower leg pain. History of right femur surgery. EXAM: RIGHT TIBIA AND FIBULA - 2 VIEW; RIGHT KNEE - COMPLETE 4+ VIEW COMPARISON:  Right knee radiographs 08/07/2018, right hip radiographs 08/06/2018 FINDINGS: There is again a lateral plate and screw fixation of the distal femur. The proximal aspect of the hardware is not imaged. There is increased chronic peripheral healing callus formation about the  previously seen oblique distal femoral diaphyseal fracture. A single distal tibial cerclage wires also again noted. Status post total right knee arthroplasty. No perihardware lucency is seen to indicate hardware failure or loosening. No joint effusion. Mild chronic spurring at the quadriceps insertion on the patella, unchanged. No acute fracture is seen within the tibia or fibula. Mild lateral tibiotalar joint space narrowing. Mild-to-moderate chronic plantar calcaneal heel spur. Mild chronic spurring at the Achilles insertion on the calcaneus. IMPRESSION:: IMPRESSION: 1. Partial visualization of distal femoral lateral plate and screw fixation hardware with progressive, apparent complete healing of the previously seen distal femoral diaphyseal fracture. 2. Status post total knee arthroplasty without evidence of hardware failure. 3. No acute fracture is seen. Electronically Signed   By: Yvonne Kendall M.D.   On: 12/12/2021 15:25   CT HEAD WO CONTRAST (5MM)  Result Date: 12/12/2021 CLINICAL DATA:  Headache, chronic, no new features h/a and scalp pain x weeks not improving h/o HTN r/o stroke EXAM: CT HEAD WITHOUT CONTRAST TECHNIQUE: Contiguous axial images were obtained from the base of the skull through the vertex without intravenous contrast. RADIATION DOSE REDUCTION: This exam was performed according to the departmental dose-optimization program which includes automated exposure control, adjustment of the mA and/or kV according to patient size and/or use of iterative reconstruction technique. COMPARISON:  CT head Jan 20, 2016. FINDINGS: Brain: No evidence of acute large vascular territory infarction, hemorrhage, hydrocephalus, extra-axial collection or mass lesion/mass effect. Chronic cerebral atrophy. Patchy white matter hypodensities, nonspecific but compatible with chronic microvascular ischemic disease. Vascular: No hyperdense vessel identified. Calcific intracranial atherosclerosis. Skull: No acute fracture.  Sinuses/Orbits: Chronic left sphenoid sinus disease with internal mineralization mucosal thickening, and osteitis. Internal hyperdensity. Other: No mastoid effusions. IMPRESSION: 1. No evidence of acute intracranial abnormality. 2. Chronic microvascular ischemic disease and cerebral atrophy. 3. Chronic left sphenoid sinus disease. Internal hyperdensity could represent inspissated secretions or fungal colonization. Electronically Signed   By: Margaretha Sheffield M.D.   On: 12/12/2021 16:23   US Venous Img Lower Bilateral  Result Date: 12/12/2021 CLINICAL DATA:  Bilateral lower leg pain and edema history of DVT EXAM: BILATERAL LOWER EXTREMITY VENOUS DOPPLER ULTRASOUND TECHNIQUE: Gray-scale sonography with graded compression, as well as color Doppler and duplex ultrasound were performed to evaluate the lower extremity deep venous systems from the level of the common femoral vein and including the common femoral, femoral, profunda femoral, popliteal and calf veins including the posterior tibial, peroneal and gastrocnemius veins when visible. The superficial great saphenous vein was also interrogated. Spectral Doppler was utilized to evaluate flow at rest and with distal augmentation maneuvers in the common femoral, femoral and popliteal veins. COMPARISON:  Right lower extremity duplex ultrasound 07/12/2015 FINDINGS: RIGHT LOWER EXTREMITY Common Femoral Vein: No evidence of thrombus. Normal compressibility, respiratory phasicity and response to augmentation. Saphenofemoral Junction: No evidence of thrombus. Normal compressibility and flow on color Doppler imaging. Profunda Femoral Vein: No evidence of thrombus. Normal compressibility and flow on color Doppler imaging. Femoral Vein: No evidence of thrombus. Normal compressibility, respiratory phasicity and response to augmentation. Popliteal  Vein: No evidence of thrombus. Normal compressibility, respiratory phasicity and response to augmentation. Calf Veins: No evidence  of thrombus. Normal compressibility and flow on color Doppler imaging. Superficial Great Saphenous Vein: No evidence of thrombus. Normal compressibility. Venous Reflux:  None. Other Findings:  None. LEFT LOWER EXTREMITY Common Femoral Vein: No evidence of thrombus. Normal compressibility, respiratory phasicity and response to augmentation. Saphenofemoral Junction: No evidence of thrombus. Normal compressibility and flow on color Doppler imaging. Profunda Femoral Vein: No evidence of thrombus. Normal compressibility and flow on color Doppler imaging. Femoral Vein: No evidence of thrombus. Normal compressibility, respiratory phasicity and response to augmentation. Popliteal Vein: No evidence of thrombus. Normal compressibility, respiratory phasicity and response to augmentation. Calf Veins: No evidence of thrombus. Normal compressibility and flow on color Doppler imaging. Superficial Great Saphenous Vein: No evidence of thrombus. Normal compressibility. Venous Reflux:  None. Other Findings:  None. IMPRESSION: No evidence of deep venous thrombosis in either lower extremity. Electronically Signed   By: Valetta Mole M.D.   On: 12/12/2021 15:58   DG Knee Complete 4 Views Right  Result Date: 12/12/2021 CLINICAL DATA:  Right knee and lower leg pain. History of right femur surgery. EXAM: RIGHT TIBIA AND FIBULA - 2 VIEW; RIGHT KNEE - COMPLETE 4+ VIEW COMPARISON:  Right knee radiographs 08/07/2018, right hip radiographs 08/06/2018 FINDINGS: There is again a lateral plate and screw fixation of the distal femur. The proximal aspect of the hardware is not imaged. There is increased chronic peripheral healing callus formation about the previously seen oblique distal femoral diaphyseal fracture. A single distal tibial cerclage wires also again noted. Status post total right knee arthroplasty. No perihardware lucency is seen to indicate hardware failure or loosening. No joint effusion. Mild chronic spurring at the quadriceps  insertion on the patella, unchanged. No acute fracture is seen within the tibia or fibula. Mild lateral tibiotalar joint space narrowing. Mild-to-moderate chronic plantar calcaneal heel spur. Mild chronic spurring at the Achilles insertion on the calcaneus. IMPRESSION:: IMPRESSION: 1. Partial visualization of distal femoral lateral plate and screw fixation hardware with progressive, apparent complete healing of the previously seen distal femoral diaphyseal fracture. 2. Status post total knee arthroplasty without evidence of hardware failure. 3. No acute fracture is seen. Electronically Signed   By: Yvonne Kendall M.D.   On: 12/12/2021 15:25       Assessment & Plan:   Problem List Items Addressed This Visit   None    Einar Pheasant, MD

## 2022-01-29 NOTE — Telephone Encounter (Signed)
Pt need refill on albuterol [facility]

## 2022-01-30 ENCOUNTER — Encounter: Payer: Self-pay | Admitting: Internal Medicine

## 2022-01-30 ENCOUNTER — Other Ambulatory Visit: Payer: Self-pay

## 2022-01-30 MED ORDER — ALBUTEROL SULFATE HFA 108 (90 BASE) MCG/ACT IN AERS: 2.0000 | INHALATION_SPRAY | Freq: Four times a day (QID) | RESPIRATORY_TRACT | 0 refills | Status: DC | PRN

## 2022-01-30 NOTE — Assessment & Plan Note (Signed)
Blood pressure 142/80 today.  Has been under better control.  Continue current medications.  Follow pressures.

## 2022-01-30 NOTE — Telephone Encounter (Signed)
sent

## 2022-01-30 NOTE — Assessment & Plan Note (Signed)
Foot pain and bruising as outlined.  Appears to have occurred yesterday evening - per history.  Able to walk without increased pain.  Will check xray. Further w/up pending results.

## 2022-02-04 DIAGNOSIS — M7122 Synovial cyst of popliteal space [Baker], left knee: Secondary | ICD-10-CM | POA: Diagnosis not present

## 2022-02-04 DIAGNOSIS — M25562 Pain in left knee: Secondary | ICD-10-CM | POA: Diagnosis not present

## 2022-02-04 NOTE — Progress Notes (Signed)
Spoke with patient's daughter, Lubertha Basque, 02/04/22 @ 14:20. Went over pre-procedure instructions to include need to arrive at 8:00 for 9:00 procedure, need to be NPO after midnight on night prior to procedure, need to hold Aspirin morning of procedure and need for driver post-procedure. Daughter verbalized understanding.

## 2022-02-05 ENCOUNTER — Other Ambulatory Visit: Payer: Self-pay | Admitting: Radiology

## 2022-02-05 DIAGNOSIS — D649 Anemia, unspecified: Secondary | ICD-10-CM

## 2022-02-06 ENCOUNTER — Other Ambulatory Visit: Payer: Self-pay | Admitting: Radiology

## 2022-02-07 ENCOUNTER — Other Ambulatory Visit: Payer: Self-pay

## 2022-02-07 ENCOUNTER — Ambulatory Visit
Admission: RE | Admit: 2022-02-07 | Discharge: 2022-02-07 | Disposition: A | Discharge status: Home / Self Care | Payer: Medicare HMO | Source: Ambulatory Visit | Attending: Internal Medicine | Admitting: Internal Medicine

## 2022-02-07 DIAGNOSIS — D696 Thrombocytopenia, unspecified: Secondary | ICD-10-CM | POA: Diagnosis not present

## 2022-02-07 DIAGNOSIS — D649 Anemia, unspecified: Secondary | ICD-10-CM

## 2022-02-07 DIAGNOSIS — D539 Nutritional anemia, unspecified: Secondary | ICD-10-CM | POA: Diagnosis not present

## 2022-02-07 LAB — CBC WITH DIFFERENTIAL/PLATELET
Abs Immature Granulocytes: 0.02 10*3/uL (ref 0.00–0.07)
Basophils Absolute: 0 10*3/uL (ref 0.0–0.1)
Basophils Relative: 0 %
Eosinophils Absolute: 0.1 10*3/uL (ref 0.0–0.5)
Eosinophils Relative: 3 %
HCT: 33.4 % — ABNORMAL LOW (ref 36.0–46.0)
Hemoglobin: 10.5 g/dL — ABNORMAL LOW (ref 12.0–15.0)
Immature Granulocytes: 0 %
Lymphocytes Relative: 31 %
Lymphs Abs: 1.7 10*3/uL (ref 0.7–4.0)
MCH: 32.4 pg (ref 26.0–34.0)
MCHC: 31.4 g/dL (ref 30.0–36.0)
MCV: 103.1 fL — ABNORMAL HIGH (ref 80.0–100.0)
Monocytes Absolute: 0.4 10*3/uL (ref 0.1–1.0)
Monocytes Relative: 8 %
Neutro Abs: 3.1 10*3/uL (ref 1.7–7.7)
Neutrophils Relative %: 58 %
Platelets: 120 10*3/uL — ABNORMAL LOW (ref 150–400)
RBC: 3.24 MIL/uL — ABNORMAL LOW (ref 3.87–5.11)
RDW: 13.6 % (ref 11.5–15.5)
WBC: 5.4 10*3/uL (ref 4.0–10.5)
nRBC: 0 % (ref 0.0–0.2)

## 2022-02-07 MED ORDER — FENTANYL CITRATE (PF) 100 MCG/2ML IJ SOLN
INTRAMUSCULAR | Status: AC
  Filled 2022-02-07: qty 2

## 2022-02-07 MED ORDER — SODIUM CHLORIDE 0.9 % IV SOLN: INTRAVENOUS | Status: DC

## 2022-02-07 MED ORDER — MIDAZOLAM HCL 2 MG/2ML IJ SOLN
INTRAMUSCULAR | Status: AC
  Filled 2022-02-07: qty 2

## 2022-02-07 MED ORDER — HEPARIN SOD (PORK) LOCK FLUSH 100 UNIT/ML IV SOLN
INTRAVENOUS | Status: AC
  Filled 2022-02-07: qty 5

## 2022-02-07 MED ORDER — FENTANYL CITRATE (PF) 100 MCG/2ML IJ SOLN
INTRAMUSCULAR | Status: AC | PRN
  Administered 2022-02-07: 25 ug via INTRAVENOUS

## 2022-02-07 NOTE — Procedures (Signed)
Vascular and Interventional Radiology Procedure Note  Patient: Kimberly Roy DOB: 03/10/1931 Medical Record Number: [id number] Note Date/Time: 02/07/22 9:37 AM   Performing Physician: Michaelle Birks, MD Assistant(s): None  Diagnosis: Anemia.  Procedure: BONE MARROW ASPIRATION and BIOPSY  Anesthesia: Conscious Sedation Complications: None Estimated Blood Loss: Minimal Specimens: Sent for Pathology  Findings:  Successful CT-guided bone marrow aspiration and biopsy A total of 2 cores were obtained. Hemostasis of the tract was achieved using Manual Pressure.  Plan: Bed rest for 1 hours.  See detailed procedure note with images in PACS. The patient tolerated the procedure well without incident or complication and was returned to Recovery in stable condition.    Michaelle Birks, MD [facility]   Pager. [place]

## 2022-02-07 NOTE — H&P (Signed)
Chief Complaint: Patient was seen in consultation today for chronic macrocytic anemia at the request of Brahmanday,Govinda R  Referring Physician(s): Cammie Sickle  Supervising Physician: Michaelle Birks  Patient Status: [facility] - Out-pt  History of Present Illness: Kimberly Roy is a 88 y.o. female with PMHx significant for chronic macrocytic anemia- etiology unknown and patient follows with hematology. She was last seen in the office on 5/24 and request received for bone marrow biopsy with image guidance and with moderate sedation for further evaluation to rule out low grade leukemia/MDS.   The patient has had a H&P performed within the last 30 days, all history, medications, and exam have been reviewed. The patient denies any interval changes since the H&P.  The patient denies any current chest pain or shortness of breath. She has no known complications to sedation.    Past Medical History:  Diagnosis Date   Anemia    Anxiety    Chest pain    CHF (congestive heart failure) (HCC)    Constipation    DDD (degenerative disc disease), cervical    Depression    DVT (deep venous thrombosis) (HCC)    Dysphonia    Dyspnea    Fatty liver    Fatty liver    Headache    Hyperlipidemia    Hyperpiesia    Hypertension    Hypothyroidism    Interstitial cystitis    Left ventricular dysfunction    Lymphedema    Nephrolithiasis    Obstructive sleep apnea    Osteoarthritis    knees/cervical and lumbar spine   Pulmonary hypertension (HCC)    Pulmonary nodules    followed by Dr Raul Del   Pure hypercholesterolemia    Renal cyst    right    Past Surgical History:  Procedure Laterality Date   ABDOMINAL HYSTERECTOMY     ovaries left in place   APPENDECTOMY     Back Surgeries     BACK SURGERY     BREAST REDUCTION SURGERY     3/99   CARDIAC CATHETERIZATION     cataracts Bilateral    CERVICAL SPINE SURGERY     ESOPHAGEAL MANOMETRY N/A 08/02/2015   Procedure: ESOPHAGEAL  MANOMETRY (EM);  Surgeon: Josefine Class, MD;  Location: [facility] ENDOSCOPY;  Service: Endoscopy;  Laterality: N/A;   ESOPHAGOGASTRODUODENOSCOPY N/A 02/27/2015   Procedure: ESOPHAGOGASTRODUODENOSCOPY (EGD);  Surgeon: Hulen Luster, MD;  Location: [facility] ENDOSCOPY;  Service: Gastroenterology;  Laterality: N/A;   ESOPHAGOGASTRODUODENOSCOPY (EGD) WITH PROPOFOL N/A 10/30/2018   Procedure: ESOPHAGOGASTRODUODENOSCOPY (EGD) WITH PROPOFOL;  Surgeon: Lollie Sails, MD;  Location: [facility] ENDOSCOPY;  Service: Endoscopy;  Laterality: N/A;   EXCISIONAL HEMORRHOIDECTOMY     EYE SURGERY     FRACTURE SURGERY     HEMORRHOID SURGERY     HIP SURGERY  2013   Right hip surgery   JOINT REPLACEMENT     KNEE ARTHROSCOPY     left and right   ORIF FEMUR FRACTURE Right 08/07/2018   Procedure: OPEN REDUCTION INTERNAL FIXATION (ORIF) DISTAL FEMUR FRACTURE;  Surgeon: Hessie Knows, MD;  Location: [facility] ORS;  Service: Orthopedics;  Laterality: Right;   REDUCTION MAMMAPLASTY Bilateral YRS AGO   REPLACEMENT TOTAL KNEE Bilateral    rotator cuff surgery     blilateral   TONSILECTOMY/ADENOIDECTOMY WITH MYRINGOTOMY     VISCERAL ARTERY INTERVENTION N/A 02/25/2019   Procedure: VISCERAL ARTERY INTERVENTION;  Surgeon: Algernon Huxley, MD;  Location: [place] [facility];  Service: Cardiovascular;  Laterality:  N/A;    Allergies: Lyrica [pregabalin], Omnicef [cefdinir], Atarax [hydroxyzine], Dicyclomine, Hydroxyzine hcl, Levaquin [levofloxacin], Nitrofurantoin, Nucynta er [tapentadol hcl er], Oxybutynin, Zoloft [sertraline hcl], Biaxin [clarithromycin], Sertraline, Sulfa antibiotics, Sulfasalazine, Tape, and Tapentadol  Medications: Prior to Admission medications   Medication Sig Start Date End Date Taking? Authorizing Provider  aluminum-magnesium hydroxide-simethicone (MAALOX) [phone] MG/5ML SUSP Take 15 mLs by mouth 4 (four) times daily -  before meals and at bedtime.    Yes [provider]  amLODipine (NORVASC) 10 MG  tablet TAKE 1 TABLET BY MOUTH DAILY 11/16/21  Yes Einar Pheasant, MD  aspirin 81 MG EC tablet Take by mouth.   Yes [provider]  atorvastatin (LIPITOR) 20 MG tablet TAKE 1 TABLET BY MOUTH AT BEDTIME. 09/05/21  Yes Einar Pheasant, MD  benazepril (LOTENSIN) 40 MG tablet TAKE 1 TABLET BY MOUTH DAILY 11/16/21  Yes Einar Pheasant, MD  busPIRone (BUSPAR) 10 MG tablet Take 10 mg by mouth 2 (two) times daily.  03/31/18  Yes [provider]  calcium citrate-vitamin D (CITRACAL+D) 315-200 MG-UNIT tablet Take 1 tablet by mouth daily.   Yes [provider]  Cholecalciferol (VITAMIN D3) 1000 UNITS CAPS Take 1 capsule by mouth daily.   Yes [provider]  ELMIRON 100 MG capsule Take 100 mg by mouth 3 (three) times daily. 11/07/20  Yes [provider]  furosemide (LASIX) 20 MG tablet TAKE 1 TABLET BY MOUTH DAILY AS NEEDED 11/16/21  Yes Einar Pheasant, MD  gabapentin (NEURONTIN) 300 MG capsule Take two tablets tid (in am, midday and q hs). 08/02/21  Yes Einar Pheasant, MD  HYDROcodone-acetaminophen (NORCO) 10-325 MG tablet Take 1 tablet by mouth 3 (three) times daily. 08/10/18  Yes Fritzi Mandes, MD  LINZESS 145 MCG CAPS capsule Take 145 mcg by mouth daily. 10/22/21  Yes [provider]  magnesium oxide (MAG-OX) 400 MG tablet Take 1 tablet (400 mg total) by mouth daily. 09/22/20  Yes Einar Pheasant, MD  Melatonin 10 MG CAPS Take 1 capsule by mouth.   Yes [provider]  methocarbamol (ROBAXIN) 500 MG tablet Take 500 mg by mouth 3 (three) times daily. As needed 03/06/21  Yes [provider]  metoprolol succinate (TOPROL-XL) 50 MG 24 hr tablet TAKE 1 TABLET BY MOUTH DAILY. TAKE WITH OR IMMEDIATELY FOLLOWING A MEAL. 11/16/21  Yes Einar Pheasant, MD  mupirocin ointment (BACTROBAN) 2 % Apply 1 application topically 2 (two) times daily. 05/14/21  Yes Einar Pheasant, MD  Naldemedine Tosylate 0.2 MG TABS Take 0.2 mg by mouth daily.   Yes [provider]  RABEprazole (ACIPHEX) 20 MG tablet TAKE 1 TABLET BY MOUTH TWICE A DAY BEFORE A MEAL 11/16/21  Yes Einar Pheasant, MD  SYNTHROID 100 MCG tablet Take 1 tablet (100 mcg total) by mouth daily. 02/19/21  Yes Einar Pheasant, MD  albuterol (VENTOLIN HFA) 108 (90 Base) MCG/ACT inhaler Inhale 2 puffs into the lungs every 6 (six) hours as needed for wheezing or shortness of breath. 01/30/22   Einar Pheasant, MD  Azelastine HCl 137 MCG/SPRAY SOLN PLACE 1 SPRAY INTO BOTH NOSTRILS TWO TIMES DAILY 06/19/21   Einar Pheasant, MD  betamethasone dipropionate 0.05 % cream Apply 1 application topically 2 (two) times daily. 10/19/20   [provider]  clobetasol cream (TEMOVATE) 6.16 % Apply 1 application topically See admin instructions. Apply to affected areas of body 1 - 2 times daily as needed for itchy bumps. Avoid applying to face, groin, and axilla. Use as directed. 07/30/21  Brendolyn Patty, MD  Diclofenac Sodium 3 % GEL Apply topically 2 (two) times daily. 12/20/19   [provider]  Docusate Sodium (DSS) 100 MG CAPS Take by mouth.    [provider]  Fluocinolone Acetonide 0.01 % OIL Apply 1-2 drops into ears once to twice daily as needed. 10/09/21   Brendolyn Patty, MD  fluocinonide (LIDEX) 0.05 % external solution Apply topically 2 (two) times daily. Apply drops to scalp for itch 07/30/21   Brendolyn Patty, MD  ketoconazole (NIZORAL) 2 % shampoo Apply 1 application topically See admin instructions. apply three times per week, massage into scalp and leave in for 10 minutes before rinsing out 07/30/21   Brendolyn Patty, MD  montelukast (SINGULAIR) 10 MG tablet 10 mg once daily as needed 12/23/19   [provider]  morphine (MS CONTIN) 60 MG 12 hr tablet Take 1 tablet (60 mg total) by mouth 2 (two) times daily. 08/10/18   Fritzi Mandes, MD  mupirocin ointment (BACTROBAN) 2 % Apply 1 application topically daily. Apply to left knee until healed 07/30/21   Brendolyn Patty, MD   nystatin (MYCOSTATIN/NYSTOP) powder Apply 1 application. topically 2 (two) times daily as needed. 01/21/22   Einar Pheasant, MD  polyethylene glycol powder (GLYCOLAX/MIRALAX) 17 GM/SCOOP powder MIX 17 GRAMS AS MARKED ON BOTTLE TOP IN 8 OUNCES OF WATER AND DRINK ONCE A DAY AS DIRECTED. 02/05/19   Einar Pheasant, MD  senna (SENOKOT) 8.6 MG TABS tablet Take 1 tablet by mouth 2 (two) times daily.    [provider]  Simethicone (GAS-X PO) Take 2 tablets by mouth 2 (two) times daily.    [provider]  sodium chloride (OCEAN) 0.65 % nasal spray Place 1 spray into the nose as needed.    [provider]  triamcinolone ointment (KENALOG) 0.1 % APPLY TWICE DAILY TO BITES AND RASH UNTIL FLAT AND SMOOTH **DO NOT APPLY TO FACE** 07/16/21   Einar Pheasant, MD     Family History  Problem Relation Age of Onset   Heart disease Mother    Stroke Mother    Hypertension Mother    Heart disease Father        myocardial infarction age 64   Breast cancer Neg Hx     Social History   Socioeconomic History   Marital status: Widowed    Spouse name: Not on file   Number of children: 2   Years of education: 62   Highest education level: 12th grade  Occupational History    Comment: insurance agency  Tobacco Use   Smoking status: Never   Smokeless tobacco: Never  Vaping Use   Vaping Use: Never used  Substance and Sexual Activity   Alcohol use: No    Alcohol/week: 0.0 standard drinks of alcohol   Drug use: Never   Sexual activity: Not Currently  Other Topics Concern   Not on file  Social History Narrative   Not on file   Social Determinants of Health   Financial Resource Strain: Low Risk  (03/09/2021)   Overall Financial Resource Strain (CARDIA)    Difficulty of Paying Living Expenses: Not hard at all  Food Insecurity: No Food Insecurity (11/15/2021)   Hunger Vital Sign    Worried About Running Out of Food in the Last Year: Never true    Ran Out of Food in the Last Year:  Never true  Transportation Needs: Unmet Transportation Needs (11/30/2021)   PRAPARE - Hydrologist (Medical):  Yes    Lack of Transportation (Non-Medical): Yes  Physical Activity: Inactive (03/09/2021)   Exercise Vital Sign    Days of Exercise per Week: 0 days    Minutes of Exercise per Session: 0 min  Stress: No Stress Concern Present (03/09/2021)   Bulger    Feeling of Stress : Only a little  Social Connections: Socially Isolated (11/15/2021)   Social Connection and Isolation Panel [NHANES]    Frequency of Communication with Friends and Family: Three times a week    Frequency of Social Gatherings with Friends and Family: Twice a week    Attends Religious Services: Never    Marine scientist or Organizations: No    Attends Archivist Meetings: Never    Marital Status: Widowed   Review of Systems: A 12 point ROS discussed and pertinent positives are indicated in the HPI above.  All other systems are negative.  Review of Systems  Vital Signs: BP (!) 166/104   Pulse (!) 102   Temp 98.1 F (36.7 C) (Oral)   Ht _0  (1.575 m)   Wt 176 lb 2 oz (79.9 kg)   SpO2 94%   BMI 32.21 kg/m   Physical Exam Constitutional:      Appearance: Normal appearance.  HENT:     Head: Normocephalic and atraumatic.  Cardiovascular:     Rate and Rhythm: Regular rhythm. Tachycardia present.  Pulmonary:     Effort: Pulmonary effort is normal. No respiratory distress.     Breath sounds: Normal breath sounds.  Skin:    General: Skin is warm and dry.  Neurological:     Mental Status: She is alert and oriented to person, place, and time.     Imaging: DG Foot 2 Views Right  Result Date: 01/30/2022 CLINICAL DATA:  Foot pain and bruising status post injury. EXAM: RIGHT FOOT - 2 VIEW COMPARISON:  None FINDINGS: Diffuse decreased bone mineralization. Moderate plantar calcaneal heel spur with  mild adjacent punctate mineralization. No acute fracture is seen. No dislocation. IMPRESSION: Moderate plantar calcaneal heel spur.  No acute fracture is seen. Electronically Signed   By: Yvonne Kendall M.D.   On: 01/30/2022 17:58    Labs:  CBC: Recent Labs    06/27/21 1236 09/26/21 1317 01/21/22 1200 01/23/22 1256  WBC 6.7 6.5 6.9 4.7  HGB 10.4* 10.8* 10.9* 10.2*  HCT 32.2* 33.7* 33.3* 31.9*  PLT 155 130* 144.0* 122*    COAGS: No results for input(s): "INR", "APTT" in the last 8760 hours.  BMP: Recent Labs    03/28/21 1405 05/14/21 1351 06/27/21 1236 09/26/21 1317 10/09/21 1258 10/10/21 1419 01/21/22 1200 01/23/22 1256  NA 136   < > 137 138 141  --  140 137  K 4.3   < > 4.4 3.9 5.3* 4.7 4.7 4.5  CL 101   < > 98 99 101  --  101 98  CO2 27   < > 33* 31 37*  --  33* 32  GLUCOSE 122*   < > 76 78 73  --  84 77  BUN 25*   < > 24* 14 23  --  23 25*  CALCIUM 9.2   < > 9.3 9.7 9.7  --  10.0 9.3  CREATININE 1.00   < > 0.93 0.69 0.97  --  1.02 0.94  GFRNONAA 54*  --  59* >60  --   --   --  58*   < > = values in this interval not displayed.    LIVER FUNCTION TESTS: Recent Labs    05/14/21 1351 06/27/21 1236 01/21/22 1200  BILITOT 0.4 0.7 0.4  AST _0 ALT _1 ALKPHOS 78 72 96  PROT 6.4 6.5 6.4  ALBUMIN 4.0 4.0 4.3    Assessment and Plan: This is a 88 year old female with PMHx significant for chronic macrocytic anemia- etiology unknown and patient follows with hematology. She was last seen in the office on 5/24 and request received for bone marrow biopsy with image guidance and with moderate sedation for further evaluation to rule out low grade leukemia/MDS.   The patient has been NPO, labs and vitals have been reviewed.  Risks and benefits of image guided bone marrow biopsy with moderate sedation was discussed with the patient including, but not limited to bleeding, infection, damage to adjacent structures or low yield requiring additional tests.  All of  the questions were answered and there is agreement to proceed.  Consent signed and in chart.  Thank you for this interesting consult.  I greatly enjoyed meeting Kimberly Roy and look forward to participating in their care.  A copy of this report was sent to the requesting provider on this date.  Electronically Signed: Hedy Jacob, PA-C 02/07/2022, 9:24 AM   I spent a total of 15 Minutes in face to face in clinical consultation, greater than 50% of which was counseling/coordinating care for macrocytic anemia.

## 2022-02-07 NOTE — Progress Notes (Signed)
Patient clinically stable post BMB per Dr Maryelizabeth Kaufmann, local along with Fentanyl 25 mcg IV given for procedure. Vitals stable pre and post procedure. Report given to [facility] R[facility] post procedure/specials.

## 2022-02-08 ENCOUNTER — Ambulatory Visit (INDEPENDENT_AMBULATORY_CARE_PROVIDER_SITE_OTHER): Payer: Medicare HMO | Admitting: *Deleted

## 2022-02-08 DIAGNOSIS — I10 Essential (primary) hypertension: Secondary | ICD-10-CM

## 2022-02-08 DIAGNOSIS — M79604 Pain in right leg: Secondary | ICD-10-CM

## 2022-02-08 DIAGNOSIS — G8929 Other chronic pain: Secondary | ICD-10-CM

## 2022-02-08 NOTE — Chronic Care Management (AMB) (Signed)
Chronic Care Management   CCM RN Visit Note  02/08/2022 Name: Kimberly Roy MRN: [id number] DOB: 1930/09/27  Subjective: Kimberly Roy is a 88 y.o. year old female who is a primary care patient of Einar Pheasant, MD. The care management team was consulted for assistance with disease management and care coordination needs.    Engaged with patient by telephone for follow up visit in response to provider referral for case management and/or care coordination services.   Consent to Services:  The patient was given information about Chronic Care Management services, agreed to services, and gave verbal consent prior to initiation of services.  Please see initial visit note for detailed documentation.   Patient agreed to services and verbal consent obtained.   Assessment: Review of patient past medical history, allergies, medications, health status, including review of consultants reports, laboratory and other test data, was performed as part of comprehensive evaluation and provision of chronic care management services.   SDOH (Social Determinants of Health) assessments and interventions performed:    CCM Care Plan  Allergies  Allergen Reactions   Lyrica [Pregabalin] Swelling   Omnicef [Cefdinir] Diarrhea and Nausea And Vomiting   Atarax [Hydroxyzine]     jittery   Dicyclomine Other (See Comments)    Abdominal bloating    Hydroxyzine Hcl     jittery   Levaquin [Levofloxacin] Swelling   Nitrofurantoin Diarrhea   Nucynta Er [Tapentadol Hcl Er] Other (See Comments)    Severe constipation    Oxybutynin Other (See Comments)    Blurred vision   Zoloft [Sertraline Hcl]     Severe headache   Biaxin [Clarithromycin] Other (See Comments) and Rash    Pt does not remember Pt does not remember   Sertraline Nausea And Vomiting    Severe headache Severe headache Other reaction(s): Headache Severe headache   Sulfa Antibiotics Rash    Pt does not remember   Sulfasalazine Rash    Pt does not  remember   Tape Rash    Durabond - redness   Tapentadol Other (See Comments) and Rash    _0     Outpatient Encounter Medications as of 02/08/2022  Medication Sig Note   albuterol (VENTOLIN HFA) 108 (90 Base) MCG/ACT inhaler Inhale 2 puffs into the lungs every 6 (six) hours as needed for wheezing or shortness of breath.    aluminum-magnesium hydroxide-simethicone (MAALOX) 762-263-33 MG/5ML SUSP Take 15 mLs by mouth 4 (four) times daily -  before meals and at bedtime.  02/09/2021: Reports taking as needed   amLODipine (NORVASC) 10 MG tablet TAKE 1 TABLET BY MOUTH DAILY    aspirin 81 MG EC tablet Take by mouth.    atorvastatin (LIPITOR) 20 MG tablet TAKE 1 TABLET BY MOUTH AT BEDTIME.    Azelastine HCl 137 MCG/SPRAY SOLN PLACE 1 SPRAY INTO BOTH NOSTRILS TWO TIMES DAILY    benazepril (LOTENSIN) 40 MG tablet TAKE 1 TABLET BY MOUTH DAILY    betamethasone dipropionate 0.05 % cream Apply 1 application topically 2 (two) times daily. 02/09/2021: Reports uses as needed   busPIRone (BUSPAR) 10 MG tablet Take 10 mg by mouth 2 (two) times daily.     calcium citrate-vitamin D (CITRACAL+D) 315-200 MG-UNIT tablet Take 1 tablet by mouth daily.    Cholecalciferol (VITAMIN D3) 1000 UNITS CAPS Take 1 capsule by mouth daily.    clobetasol cream (TEMOVATE) 5.45 % Apply 1 application topically See admin instructions. Apply to affected areas  of body 1 - 2 times daily as needed for itchy bumps. Avoid applying to face, groin, and axilla. Use as directed.    Diclofenac Sodium 3 % GEL Apply topically 2 (two) times daily. 02/09/2021: As needed   Docusate Sodium (DSS) 100 MG CAPS Take by mouth.    ELMIRON 100 MG capsule Take 100 mg by mouth 3 (three) times daily.    Fluocinolone Acetonide 0.01 % OIL Apply 1-2 drops into ears once to twice daily as needed.    fluocinonide (LIDEX) 0.05 % external solution Apply topically 2 (two)  times daily. Apply drops to scalp for itch    furosemide (LASIX) 20 MG tablet TAKE 1 TABLET BY MOUTH DAILY AS NEEDED    gabapentin (NEURONTIN) 300 MG capsule Take two tablets tid (in am, midday and q hs).    HYDROcodone-acetaminophen (NORCO) 10-325 MG tablet Take 1 tablet by mouth 3 (three) times daily.    ketoconazole (NIZORAL) 2 % shampoo Apply 1 application topically See admin instructions. apply three times per week, massage into scalp and leave in for 10 minutes before rinsing out    LINZESS 145 MCG CAPS capsule Take 145 mcg by mouth daily.    magnesium oxide (MAG-OX) 400 MG tablet Take 1 tablet (400 mg total) by mouth daily.    Melatonin 10 MG CAPS Take 1 capsule by mouth.    methocarbamol (ROBAXIN) 500 MG tablet Take 500 mg by mouth 3 (three) times daily. As needed    metoprolol succinate (TOPROL-XL) 50 MG 24 hr tablet TAKE 1 TABLET BY MOUTH DAILY. TAKE WITH OR IMMEDIATELY FOLLOWING A MEAL.    montelukast (SINGULAIR) 10 MG tablet 10 mg once daily as needed    morphine (MS CONTIN) 60 MG 12 hr tablet Take 1 tablet (60 mg total) by mouth 2 (two) times daily.    mupirocin ointment (BACTROBAN) 2 % Apply 1 application topically 2 (two) times daily.    mupirocin ointment (BACTROBAN) 2 % Apply 1 application topically daily. Apply to left knee until healed    Naldemedine Tosylate 0.2 MG TABS Take 0.2 mg by mouth daily.    nystatin (MYCOSTATIN/NYSTOP) powder Apply 1 application. topically 2 (two) times daily as needed.    polyethylene glycol powder (GLYCOLAX/MIRALAX) 17 GM/SCOOP powder MIX 17 GRAMS AS MARKED ON BOTTLE TOP IN 8 OUNCES OF WATER AND DRINK ONCE A DAY AS DIRECTED.    RABEprazole (ACIPHEX) 20 MG tablet TAKE 1 TABLET BY MOUTH TWICE A DAY BEFORE A MEAL    senna (SENOKOT) 8.6 MG TABS tablet Take 1 tablet by mouth 2 (two) times daily.    Simethicone (GAS-X PO) Take 2 tablets by mouth 2 (two) times daily.    sodium chloride (OCEAN) 0.65 % nasal spray Place 1 spray into the nose as needed.     SYNTHROID 100 MCG tablet Take 1 tablet (100 mcg total) by mouth daily.    triamcinolone ointment (KENALOG) 0.1 % APPLY TWICE DAILY TO BITES AND RASH UNTIL FLAT AND SMOOTH **DO NOT APPLY TO FACE**    No facility-administered encounter medications on file as of 02/08/2022.    Patient Active Problem List   Diagnosis Date Noted   Right foot pain 01/29/2022   SOB (shortness of breath) 10/14/2021   Rash 03/03/2021   Skin lesion 03/03/2021   Aortic atherosclerosis ([place]) 11/20/2020   Bradycardia 06/08/2020   Itching 04/08/2020   Dyspnea 01/17/2020   Chronic respiratory failure with hypoxia ([place]) 01/17/2020   Right hip pain 01/10/2020  Leukocytosis 11/15/2019   Wound of buttock 10/25/2019   COVID-19 virus infection 10/04/2019   Urinary frequency 09/26/2019   Cold feeling 09/12/2019   Left knee pain 07/04/2019   Hemoptysis 06/30/2019   Femur fracture ([place]) 08/06/2018   Primary osteoarthritis of right shoulder 08/15/2017   Right shoulder pain 05/16/2017   Chronic venous insufficiency 02/10/2017   Lymphedema 02/10/2017   Neuropathy 12/25/2016   Anemia due to blood loss, chronic 08/07/2016   GERD (gastroesophageal reflux disease) 12/10/2015   Finger pain 11/13/2015   Carotid artery calcification 09/26/2015   External nasal lesion 09/25/2015   Muscle cramps 09/01/2015   Excessive sweating 07/30/2015   Headache 07/16/2015   Groin pain 05/14/2015   Muscle twitching 03/05/2015   Leg cramps 02/11/2015   Back pain 02/11/2015   Abdominal pain 02/11/2015   Chronic pain 01/19/2015   Acute cystitis without hematuria 12/12/2014   Left elbow pain 11/30/2014   Health care maintenance 11/30/2014   Osteoporosis 10/19/2014   Rectal bleeding 10/19/2014   Neck pain 09/03/2014   Unsteady gait 09/03/2014   Nocturia 09/03/2014   Dysphagia 06/01/2014   Nasal dryness 06/01/2014   Stress 06/01/2014   Fatigue 02/27/2014   Degenerative disc disease 12/21/2013   Pre-op evaluation 10/12/2013    Hoarseness 08/14/2013   Leg swelling 01/24/2013   CHF (congestive heart failure) ([place]) 12/29/2012   Cough 12/29/2012   OSA (obstructive sleep apnea) 07/09/2012   Osteoarthritis 07/04/2012   Symptomatic anemia 07/04/2012   Chronic constipation 07/04/2012   Pulmonary hypertension ([place]) 07/04/2012   Pulmonary nodules 07/04/2012   Hypertension 07/04/2012   Hyperlipidemia 07/04/2012   Hypothyroidism 07/04/2012    Conditions to be addressed/monitored:HTN and pain  Care Plan : RNCM Hypertension & Chronic Pain  (Adult)  Updates made by Leona Singleton, RN since 02/08/2022 12:00 AM     Problem: Hypertension  & Chronic Pain   Priority: Medium     Long-Range Goal: Hypertension & Chronic Pain Monitored   Start Date: 02/08/2022  Expected End Date: 02/07/2023  Recent Progress: Not on track  Priority: Medium  Note:   Objective:   Last practice recorded BP readings:  BP Readings from Last 3 Encounters:  10/09/21 132/76  09/26/21 (!) 149/64  07/19/21 138/74  Current Barriers:  Knowledge Deficits related to basic understanding of hypertension pathophysiology and self care management; patient reports she does not monitor blood pressures often at home.  Does state that when she does it is typically normal.  She is primary caregiver for her 92 year old son who is mentally disabled.  Knowledge Deficits related to self-health management of chronic pain;  Chronic Disease Management support and education needs related to chronic pain 10/2021--Continues to report not monitoring blood pressures, regularly.  Discussed importance of monitoring blood pressure and patient agrees to check weekly.  patient reporting severe pain to shoulder, as well as generalized arthritis pain in other joints knees, wrist, back, and hips.  States her shoulder is "bone on bone" and she receives injections every 3 months. Feels like current pain is being managed well at this time.  Does express some concern about not receiving  quality help from the aides coming to her home, not satisfied with their services.  Report she is supposed to receive  an aide twice a week 4 hours. In addition to the assistance for ger son.  Currently, only receiving help once a week.  Discussed CCM SOCIAL WORK referral for assistance 3/27--reports increase in shoulder pain related to almost  time for shoulder injections.  States blood pressures have been ok with systolic ranges of 854-627'O, unsure of diastolic at this time.  Continues to search for reliable assistance with managing IADLs at home, working with CCM Social Work; awaiting [place] 6/9--Underwent bone marrow biopsy yesterday and is pretty sore from procedure.  Does report she has obtained home health aide from [facility] [place] and she is happy with aide.  Having left knee pain and has appointment with [facility]  02/19/2022 10:30 AM.  Case Manager Clinical Goal(s):   patient will verbalize understanding of plan for hypertension management patient will demonstrate improved adherence to prescribed treatment plan for hypertension as evidenced by taking all medications as prescribed, monitoring and recording blood pressure as directed, adhering to low sodium/DASH diet patient will verbalize understanding of plan for pain management. , patient will demonstrate use of different relaxation  skills and/or diversional activities to assist with pain reduction (distraction, imagery, relaxation, massage, acupressure, TENS, heat, and cold application., patient will use pharmacological and nonpharmacological pain relief strategies as prescribed. , and patient will verbalize acceptable level of pain relief and ability to engage in desired activities  Pain:  (Status: Goal on track: NO.) Long Term Goal  Pain assessment performed Medications reviewed Reviewed provider established plan for pain management; Discussed importance of adherence to all scheduled medical  appointments; Counseled on the importance of reporting any/all new or changed pain symptoms or management strategies to pain management provider; Advised patient to report to care team affect of pain on daily activities; Discussed use of relaxation techniques and/or diversional activities to assist with pain reduction (distraction, imagery, relaxation, massage, acupressure, TENS, heat, and cold application; Reviewed with patient prescribed pharmacological and nonpharmacological pain relief strategies;  Encouraged to contact provider if pan becomes unbearable  HTN:  (Status: Goal on track: NO.) Long Term Goal  Interventions:  Pain assessment performed Medications reviewed Collaboration with Einar Pheasant, MD regarding development and update of comprehensive plan of care as evidenced by provider attestation and co-signature Inter-disciplinary care team collaboration (see longitudinal plan of care) Evaluation of current treatment plan related to hypertension self management and patient's adherence to plan as established by provider. Provided education to patient re: stroke prevention, s/s of heart attack and stroke, DASH diet, complications of uncontrolled blood pressure Reviewed medications with patient and discussed importance of compliance Discussed plans with patient for ongoing care management follow up and provided patient with direct contact information for care management team Advised patient, providing education and rationale, to monitor blood pressure weekly and record, calling PCP for findings outside established parameters.  Encouraged to keep and attend scheduled medical appointments Encouraged to call provider office for new concerns, questions, or BP outside discussed parameters Encouraged to follow a low sodium diet/DASH diet Encouraged to check blood pressure weekly and write blood pressure results in a log to take to appointments for provider review discusssed monitoring more  frequently (daily or 3 times a week) if blood pressures are more elevated notifying provider for sustained elevations Congratulated on monitoring blood pressures and taking readings for provider review  Patient Goals/Self-Care Activities: Check blood pressure weekly or more frequently if elevated Write blood pressure results in a log and take log to appointments for provider review Continue to work with CCM Social Worker for resources for respite care for son Learn relaxation techniques and use relaxation during pain  Practice acceptance of chronic pain Practice relaxation or meditation daily Tell myself I can (not I can't) Use  distraction techniques Take medication as prescribed Use Ice and Heat to help with pain Attend appointment with ortho Follow Up Plan: The care management team will reach out to the patient again over the next 45 business days.      Plan:The care management team will reach out to the patient again over the next 45 days.  Hubert Azure RN, MSN RN Care Management Coordinator [place] [phone] [email or web address]

## 2022-02-08 NOTE — Patient Instructions (Signed)
Visit Information  Thank you for taking time to visit with me today. Please don't hesitate to contact me if I can be of assistance to you before our next scheduled telephone appointment.  Following are the goals we discussed today:  Check blood pressure weekly or more frequently if elevated Write blood pressure results in a log and take log to appointments for provider review Continue to work with CCM Social Worker for resources for respite care for son Learn relaxation techniques and use relaxation during pain  Practice acceptance of chronic pain Practice relaxation or meditation daily Tell myself I can (not I can't) Use distraction techniques Take medication as prescribed Use Ice and Heat to help with pain Attend appointment with ortho  Our next appointment is by telephone on 7/7 at 1039  Please call the care guide team at [phone] if you need to cancel or reschedule your appointment.   If you are experiencing a Mental Health or [place] or need someone to talk to, please call the Suicide and Crisis Lifeline: 988 call the [place] National Suicide Prevention Lifeline: (856)389-9503 or TTY: 989-547-3840 TTY 2145863695) to talk to a trained counselor call 1-800-273-TALK (toll free, 24 hour hotline) call 911   Patient verbalizes understanding of instructions and care plan provided today and agrees to view in [place]. Active MyChart status and patient understanding of how to access instructions and care plan via MyChart confirmed with patient.     Hubert Azure RN, MSN RN Care Management Coordinator [place] [phone] [email or web address]

## 2022-02-15 ENCOUNTER — Encounter (HOSPITAL_COMMUNITY): Payer: Self-pay | Admitting: Internal Medicine

## 2022-02-18 ENCOUNTER — Other Ambulatory Visit: Payer: Self-pay | Admitting: Internal Medicine

## 2022-02-18 DIAGNOSIS — Z1231 Encounter for screening mammogram for malignant neoplasm of breast: Secondary | ICD-10-CM

## 2022-02-19 LAB — SURGICAL PATHOLOGY

## 2022-02-20 ENCOUNTER — Other Ambulatory Visit: Payer: Self-pay | Admitting: Internal Medicine

## 2022-02-22 ENCOUNTER — Other Ambulatory Visit: Payer: Self-pay | Admitting: Specialist

## 2022-02-22 DIAGNOSIS — M7122 Synovial cyst of popliteal space [Baker], left knee: Secondary | ICD-10-CM

## 2022-02-25 ENCOUNTER — Ambulatory Visit: Payer: Medicare HMO

## 2022-02-25 ENCOUNTER — Other Ambulatory Visit: Payer: Medicare HMO

## 2022-02-25 ENCOUNTER — Ambulatory Visit: Payer: Medicare HMO | Admitting: Internal Medicine

## 2022-02-28 ENCOUNTER — Inpatient Hospital Stay (HOSPITAL_BASED_OUTPATIENT_CLINIC_OR_DEPARTMENT_OTHER): Payer: Medicare HMO | Admitting: Internal Medicine

## 2022-02-28 ENCOUNTER — Inpatient Hospital Stay: Payer: Medicare HMO

## 2022-02-28 ENCOUNTER — Inpatient Hospital Stay: Payer: Medicare HMO | Attending: Internal Medicine

## 2022-02-28 ENCOUNTER — Encounter: Payer: Self-pay | Admitting: Internal Medicine

## 2022-02-28 DIAGNOSIS — M549 Dorsalgia, unspecified: Secondary | ICD-10-CM | POA: Insufficient documentation

## 2022-02-28 DIAGNOSIS — M2550 Pain in unspecified joint: Secondary | ICD-10-CM | POA: Insufficient documentation

## 2022-02-28 DIAGNOSIS — D649 Anemia, unspecified: Secondary | ICD-10-CM | POA: Diagnosis not present

## 2022-02-28 DIAGNOSIS — E039 Hypothyroidism, unspecified: Secondary | ICD-10-CM | POA: Diagnosis not present

## 2022-02-28 DIAGNOSIS — Z86718 Personal history of other venous thrombosis and embolism: Secondary | ICD-10-CM | POA: Insufficient documentation

## 2022-02-28 DIAGNOSIS — I509 Heart failure, unspecified: Secondary | ICD-10-CM | POA: Insufficient documentation

## 2022-02-28 DIAGNOSIS — Z881 Allergy status to other antibiotic agents status: Secondary | ICD-10-CM | POA: Insufficient documentation

## 2022-02-28 DIAGNOSIS — Z888 Allergy status to other drugs, medicaments and biological substances status: Secondary | ICD-10-CM | POA: Diagnosis not present

## 2022-02-28 DIAGNOSIS — D696 Thrombocytopenia, unspecified: Secondary | ICD-10-CM | POA: Insufficient documentation

## 2022-02-28 DIAGNOSIS — Z8719 Personal history of other diseases of the digestive system: Secondary | ICD-10-CM | POA: Insufficient documentation

## 2022-02-28 DIAGNOSIS — Z79899 Other long term (current) drug therapy: Secondary | ICD-10-CM | POA: Insufficient documentation

## 2022-02-28 DIAGNOSIS — Z823 Family history of stroke: Secondary | ICD-10-CM | POA: Insufficient documentation

## 2022-02-28 DIAGNOSIS — G8929 Other chronic pain: Secondary | ICD-10-CM | POA: Insufficient documentation

## 2022-02-28 DIAGNOSIS — Z882 Allergy status to sulfonamides status: Secondary | ICD-10-CM | POA: Insufficient documentation

## 2022-02-28 DIAGNOSIS — Z9049 Acquired absence of other specified parts of digestive tract: Secondary | ICD-10-CM | POA: Insufficient documentation

## 2022-02-28 DIAGNOSIS — D539 Nutritional anemia, unspecified: Secondary | ICD-10-CM | POA: Diagnosis not present

## 2022-02-28 DIAGNOSIS — Z8249 Family history of ischemic heart disease and other diseases of the circulatory system: Secondary | ICD-10-CM | POA: Diagnosis not present

## 2022-02-28 DIAGNOSIS — R5383 Other fatigue: Secondary | ICD-10-CM | POA: Diagnosis not present

## 2022-02-28 DIAGNOSIS — I110 Hypertensive heart disease with heart failure: Secondary | ICD-10-CM | POA: Insufficient documentation

## 2022-02-28 DIAGNOSIS — I272 Pulmonary hypertension, unspecified: Secondary | ICD-10-CM | POA: Insufficient documentation

## 2022-02-28 DIAGNOSIS — Z79890 Hormone replacement therapy (postmenopausal): Secondary | ICD-10-CM | POA: Diagnosis not present

## 2022-02-28 DIAGNOSIS — E611 Iron deficiency: Secondary | ICD-10-CM | POA: Diagnosis not present

## 2022-02-28 DIAGNOSIS — D500 Iron deficiency anemia secondary to blood loss (chronic): Secondary | ICD-10-CM

## 2022-02-28 LAB — CBC WITH DIFFERENTIAL/PLATELET
Abs Immature Granulocytes: 0.02 10*3/uL (ref 0.00–0.07)
Basophils Absolute: 0 10*3/uL (ref 0.0–0.1)
Basophils Relative: 1 %
Eosinophils Absolute: 0.2 10*3/uL (ref 0.0–0.5)
Eosinophils Relative: 4 %
HCT: 31.4 % — ABNORMAL LOW (ref 36.0–46.0)
Hemoglobin: 10 g/dL — ABNORMAL LOW (ref 12.0–15.0)
Immature Granulocytes: 0 %
Lymphocytes Relative: 19 %
Lymphs Abs: 1.2 10*3/uL (ref 0.7–4.0)
MCH: 33.1 pg (ref 26.0–34.0)
MCHC: 31.8 g/dL (ref 30.0–36.0)
MCV: 104 fL — ABNORMAL HIGH (ref 80.0–100.0)
Monocytes Absolute: 0.4 10*3/uL (ref 0.1–1.0)
Monocytes Relative: 7 %
Neutro Abs: 4.2 10*3/uL (ref 1.7–7.7)
Neutrophils Relative %: 69 %
Platelets: 113 10*3/uL — ABNORMAL LOW (ref 150–400)
RBC: 3.02 MIL/uL — ABNORMAL LOW (ref 3.87–5.11)
RDW: 13.4 % (ref 11.5–15.5)
WBC: 6.1 10*3/uL (ref 4.0–10.5)
nRBC: 0 % (ref 0.0–0.2)

## 2022-02-28 LAB — BASIC METABOLIC PANEL
Anion gap: 9 (ref 5–15)
BUN: 20 mg/dL (ref 8–23)
CO2: 31 mmol/L (ref 22–32)
Calcium: 9 mg/dL (ref 8.9–10.3)
Chloride: 98 mmol/L (ref 98–111)
Creatinine, Ser: 1 mg/dL (ref 0.44–1.00)
GFR, Estimated: 54 mL/min — ABNORMAL LOW (ref >60)
Glucose, Bld: 124 mg/dL — ABNORMAL HIGH (ref 70–99)
Potassium: 3.9 mmol/L (ref 3.5–5.1)
Sodium: 138 mmol/L (ref 135–145)

## 2022-02-28 LAB — LACTATE DEHYDROGENASE: LDH: 143 U/L (ref 98–192)

## 2022-02-28 NOTE — Progress Notes (Signed)
[name] NOTE  Patient Care Team: Einar Pheasant, [name], [name], [name], [name], [name], [name]  CHIEF COMPLAINTS/PURPOSE OF CONSULTATION: Anemia  # CHRONIC MILD ANEMIA hb ~11.[Previous Dr.Gittin pt]; [place] 2017- work up  [employer] [place] [employer]; stool card; Iron/ monoclonal work up- NEGATIVE] ; MARCH 2022- B12 WNL. BONE MARROW BIOPSY-JUNE 2023- DIAGNOSIS:   BONE MARROW, ASPIRATE, CLOT, CORE:  -Overall normocellular bone marrow with orderly trilineage  hematopoiesis, see note.  -  Essentially absent histiocytic iron stores   PERIPHERAL BLOOD:  -Macrocytic anemia and thrombocytopenia.   NOTE:  While the biopsy is inadequate for interpretation the overall aspirate  and clot section are adequate and revealed an otherwise normocellular  bone marrow.  While there is orderly maturation and unremarkable  morphology to the myeloid and erythroid lineages a compensatory  erythroid hyperplasia with left shift is not definitively identified.  There is a mild megakaryocytic hyperplasia with some atypical forms;  however, this may simply be a compensatory megakaryocytic hyperplasia  with left shift.  There is no definitive dysplasia in any of the 3  lineages.  Given the borderline plasmacytosis by aspirate morphology  immunohistochemical stains were performed identifying a normal number of  polyclonal plasma cells.   It should be noted that while my indices did anemia is macrocytic there  is essentially absent histiocytic iron stores.  A morphologic etiology  for the patient's anemia and thrombocytopenia is not identified.  Correlation with pending conventional cytogenetics is recommended to  fully exclude a possible myelodysplastic syndrome.  If  clinically  indicated additional molecular testing such as a next generation myeloid  panel could be considered.   # CHRONIC INTERSTITIAL CYSTITIS/ wheel chair bound  Oncology History   No history exists.    HISTORY OF PRESENTING ILLNESS: Accompanied by caregiver.  In wheel chair.   Kimberly Roy 88 y.o.  female  with a history of chronic anemia- is here for a follow up/review results of the bone marrow biopsy.  Patient continues to complain of chronic mild to moderate fatigue.  Continues to have chronic back pain overall stable.   Patient continues to complain of chronic moderate fatigue.  Notes no significant improvement after iron infusion.. Denies any blood in stools or black-colored stools.  No nausea no vomiting.  No abdominal pain.  No easy bleeding/bruising noted.   Review of Systems  Constitutional:  Positive for malaise/fatigue. Negative for chills, diaphoresis and fever.  HENT:  Negative for nosebleeds and sore throat.   Eyes:  Negative for double vision.  Respiratory:  Negative for cough, hemoptysis, sputum production, shortness of breath and wheezing.   Cardiovascular:  Negative for chest pain, palpitations, orthopnea and leg swelling.  Gastrointestinal:  Negative for abdominal pain, blood in stool, constipation, diarrhea, heartburn, melena, nausea and vomiting.  Genitourinary:  Negative for dysuria, frequency and urgency.  Musculoskeletal:  Positive for back pain and joint pain.  Skin: Negative.  Negative for itching and rash.  Neurological:  Negative for dizziness, tingling, focal weakness, weakness and headaches.  Endo/Heme/Allergies:  Does not bruise/bleed easily.  Psychiatric/Behavioral:  Negative for depression. The patient is not nervous/anxious and does not have insomnia.      MEDICAL HISTORY:  Past Medical History:  Diagnosis Date   Anemia    Anxiety    Chest pain  CHF (congestive heart failure) (HCC)    Constipation    DDD (degenerative disc disease),  cervical    Depression    DVT (deep venous thrombosis) (HCC)    Dysphonia    Dyspnea    Fatty liver    Fatty liver    Headache    Hyperlipidemia    Hyperpiesia    Hypertension    Hypothyroidism    Interstitial cystitis    Left ventricular dysfunction    Lymphedema    Nephrolithiasis    Obstructive sleep apnea    Osteoarthritis    knees/cervical and lumbar spine   Pulmonary hypertension (HCC)    Pulmonary nodules    followed by Dr Raul Del   Pure hypercholesterolemia    Renal cyst    right    SURGICAL HISTORY: Past Surgical History:  Procedure Laterality Date   ABDOMINAL HYSTERECTOMY     ovaries left in place   APPENDECTOMY     Back Surgeries     BACK SURGERY     BREAST REDUCTION SURGERY     3/99   CARDIAC CATHETERIZATION     cataracts Bilateral    CERVICAL SPINE SURGERY     ESOPHAGEAL MANOMETRY N/A 08/02/2015   Procedure: ESOPHAGEAL MANOMETRY (EM);  Surgeon: Josefine Class, MD;  Location: [facility] ENDOSCOPY;  Service: Endoscopy;  Laterality: N/A;   ESOPHAGOGASTRODUODENOSCOPY N/A 02/27/2015   Procedure: ESOPHAGOGASTRODUODENOSCOPY (EGD);  Surgeon: Hulen Luster, MD;  Location[facility] ENDOSCOPY;  Service: Gastroenterology;  Laterality: N/A;   ESOPHAGOGASTRODUODENOSCOPY (EGD) WITH PROPOFOL N/A 10/30/2018   Procedure: ESOPHAGOGASTRODUODENOSCOPY (EGD) WITH PROPOFOL;  Surgeon: Lollie Sails, MD;  Location: [facility] ENDOSCOPY;  Service: Endoscopy;  Laterality: N/A;   EXCISIONAL HEMORRHOIDECTOMY     EYE SURGERY     FRACTURE SURGERY     HEMORRHOID SURGERY     HIP SURGERY  2013   Right hip surgery   JOINT REPLACEMENT     KNEE ARTHROSCOPY     left and right   ORIF FEMUR FRACTURE Right 08/07/2018   Procedure: OPEN REDUCTION INTERNAL FIXATION (ORIF) DISTAL FEMUR FRACTURE;  Surgeon: Hessie Knows, MD;  Location: [facility] ORS;  Service: Orthopedics;  Laterality: Right;   REDUCTION MAMMAPLASTY Bilateral YRS AGO   REPLACEMENT TOTAL KNEE Bilateral    rotator cuff surgery     blilateral    TONSILECTOMY/ADENOIDECTOMY WITH MYRINGOTOMY     VISCERAL ARTERY INTERVENTION N/A 02/25/2019   Procedure: VISCERAL ARTERY INTERVENTION;  Surgeon: Algernon Huxley, MD;  Location: [facility];  Service: Cardiovascular;  Laterality: N/A;    SOCIAL HISTORY: Social History   Socioeconomic History   Marital status: Widowed    Spouse name: Not on file   Number of children: 2   Years of education: 27   Highest education level: 12th grade  Occupational History    Comment: insurance agency  Tobacco Use   Smoking status: Never   Smokeless tobacco: Never  Vaping Use   Vaping Use: Never used  Substance and Sexual Activity   Alcohol use: No    Alcohol/week: 0.0 standard drinks of alcohol   Drug use: Never   Sexual activity: Not Currently  Other Topics Concern   Not on file  Social History Narrative   Not on file   Social Determinants of Health   Financial Resource Strain: Low Risk  (03/09/2021)   Overall Financial Resource Strain (CARDIA)    Difficulty of Paying Living Expenses: Not hard at all  Food Insecurity: No Food Insecurity (11/15/2021)  Hunger Vital Sign    Worried About Running Out of Food in the Last Year: Never true    Ran Out of Food in the Last Year: Never true  Transportation Needs: Unmet Transportation Needs (11/30/2021)   PRAPARE - Transportation    Lack of Transportation (Medical): Yes    Lack of Transportation (Non-Medical): Yes  Physical Activity: Inactive (03/09/2021)   Exercise Vital Sign    Days of Exercise per Week: 0 days    Minutes of Exercise per Session: 0 min  Stress: No Stress Concern Present (03/09/2021)   [place]    Feeling of Stress : Only a little  Social Connections: Socially Isolated (11/15/2021)   Social Connection and Isolation Panel [NHANES]    Frequency of Communication with Friends and Family: Three times a week    Frequency of Social Gatherings with Friends and Family:  Twice a week    Attends Religious Services: Never    Marine scientist or Organizations: No    Attends Archivist Meetings: Never    Marital Status: Widowed  Intimate Partner Violence: Not At Risk (11/15/2021)   Humiliation, Afraid, Rape, and Kick questionnaire    Fear of Current or Ex-Partner: No    Emotionally Abused: No    Physically Abused: No    Sexually Abused: No    FAMILY HISTORY: Family History  Problem Relation Age of Onset   Heart disease Mother    Stroke Mother    Hypertension Mother    Heart disease Father        myocardial infarction age 5   Breast cancer Neg Hx     ALLERGIES:  is allergic to lyrica [pregabalin], omnicef [cefdinir], atarax [hydroxyzine], dicyclomine, hydroxyzine hcl, levaquin [levofloxacin], nitrofurantoin, nucynta er [tapentadol hcl er], oxybutynin, zoloft [sertraline hcl], biaxin [clarithromycin], sertraline, sulfa antibiotics, sulfasalazine, tape, and tapentadol.  MEDICATIONS:  Current Outpatient Medications  Medication Sig Dispense Refill   albuterol (VENTOLIN HFA) 108 (90 Base) MCG/ACT inhaler Inhale 2 puffs into the lungs every 6 (six) hours as needed for wheezing or shortness of breath. 18 g 0   aluminum-magnesium hydroxide-simethicone (MAALOX) 295-621-30 MG/5ML SUSP Take 15 mLs by mouth 4 (four) times daily -  before meals and at bedtime.      amLODipine (NORVASC) 10 MG tablet TAKE 1 TABLET BY MOUTH DAILY 90 tablet 1   aspirin 81 MG EC tablet Take by mouth.     atorvastatin (LIPITOR) 20 MG tablet TAKE 1 TABLET BY MOUTH AT BEDTIME. 90 tablet 1   Azelastine HCl 137 MCG/SPRAY SOLN PLACE 1 SPRAY INTO BOTH NOSTRILS TWO TIMES DAILY 30 mL 1   benazepril (LOTENSIN) 40 MG tablet TAKE 1 TABLET BY MOUTH DAILY 90 tablet 1   betamethasone dipropionate 0.05 % cream Apply 1 application topically 2 (two) times daily.     busPIRone (BUSPAR) 10 MG tablet Take 10 mg by mouth 2 (two) times daily.      calcium citrate-vitamin D (CITRACAL+D)  315-200 MG-UNIT tablet Take 1 tablet by mouth daily.     Cholecalciferol (VITAMIN D3) 1000 UNITS CAPS Take 1 capsule by mouth daily.     clobetasol cream (TEMOVATE) 8.65 % Apply 1 application topically See admin instructions. Apply to affected areas of body 1 - 2 times daily as needed for itchy bumps. Avoid applying to face, groin, and axilla. Use as directed. 60 g 1   Diclofenac Sodium 3 % GEL Apply topically 2 (two) times daily.  Docusate Sodium (DSS) 100 MG CAPS Take by mouth.     DULoxetine (CYMBALTA) 60 MG capsule Take 60 mg by mouth daily.     ELMIRON 100 MG capsule Take 100 mg by mouth 3 (three) times daily.     Fluocinolone Acetonide 0.01 % OIL Apply 1-2 drops into ears once to twice daily as needed. 20 mL 3   fluocinonide (LIDEX) 0.05 % external solution Apply topically 2 (two) times daily. Apply drops to scalp for itch 60 mL 3   furosemide (LASIX) 20 MG tablet TAKE 1 TABLET BY MOUTH DAILY AS NEEDED 90 tablet 0   gabapentin (NEURONTIN) 300 MG capsule Take two tablets tid (in am, midday and q hs). 180 capsule 2   HYDROcodone-acetaminophen (NORCO) 10-325 MG tablet Take 1 tablet by mouth 3 (three) times daily. 10 tablet 0   ketoconazole (NIZORAL) 2 % shampoo Apply 1 application topically See admin instructions. apply three times per week, massage into scalp and leave in for 10 minutes before rinsing out 120 mL 3   LINZESS 145 MCG CAPS capsule Take 145 mcg by mouth daily.     magnesium oxide (MAG-OX) 400 MG tablet Take 1 tablet (400 mg total) by mouth daily. 30 tablet 1   Melatonin 10 MG CAPS Take 1 capsule by mouth.     meloxicam (MOBIC) 15 MG tablet Take 15 mg by mouth daily.     methocarbamol (ROBAXIN) 500 MG tablet Take 500 mg by mouth 3 (three) times daily. As needed     metoprolol succinate (TOPROL-XL) 50 MG 24 hr tablet TAKE 1 TABLET BY MOUTH DAILY. TAKE WITH OR IMMEDIATELY FOLLOWING A MEAL. 90 tablet 1   montelukast (SINGULAIR) 10 MG tablet 10 mg once daily as needed     morphine  (MS CONTIN) 60 MG 12 hr tablet Take 1 tablet (60 mg total) by mouth 2 (two) times daily. 10 tablet 0   mupirocin ointment (BACTROBAN) 2 % Apply 1 application topically 2 (two) times daily. 22 g 0   mupirocin ointment (BACTROBAN) 2 % Apply 1 application topically daily. Apply to left knee until healed 22 g 0   Naldemedine Tosylate 0.2 MG TABS Take 0.2 mg by mouth daily.     nystatin (MYCOSTATIN/NYSTOP) powder Apply 1 application. topically 2 (two) times daily as needed. 60 g 0   polyethylene glycol powder (GLYCOLAX/MIRALAX) 17 GM/SCOOP powder MIX 17 GRAMS AS MARKED ON BOTTLE TOP IN 8 OUNCES OF WATER AND DRINK ONCE A DAY AS DIRECTED. 527 g 0   RABEprazole (ACIPHEX) 20 MG tablet TAKE 1 TABLET BY MOUTH TWICE A DAY BEFORE A MEAL 180 tablet 1   senna (SENOKOT) 8.6 MG TABS tablet Take 1 tablet by mouth 2 (two) times daily.     Simethicone (GAS-X PO) Take 2 tablets by mouth 2 (two) times daily.     sodium chloride (OCEAN) 0.65 % nasal spray Place 1 spray into the nose as needed.     SYNTHROID 100 MCG tablet Take 1 tablet (100 mcg total) by mouth daily. 90 tablet 3   triamcinolone ointment (KENALOG) 0.1 % APPLY TWICE DAILY TO BITES AND RASH UNTIL FLAT AND SMOOTH **DO NOT APPLY TO FACE** 80 g 0   No current facility-administered medications for this visit.      Marland Kitchen  PHYSICAL EXAMINATION: ECOG PERFORMANCE STATUS: 0 - Asymptomatic  Vitals:   02/28/22 1100  BP: (!) 142/58  Pulse: 60  Resp: 18  Temp: 98.7 F (37.1 C)   There  were no vitals filed for this visit.   Physical Exam Constitutional:      Comments: Patient in wheelchair because of chronic arthritic problems.  HENT:     Head: Normocephalic and atraumatic.     Mouth/Throat:     Pharynx: No oropharyngeal exudate.  Eyes:     Pupils: Pupils are equal, round, and reactive to light.  Cardiovascular:     Rate and Rhythm: Normal rate and regular rhythm.  Pulmonary:     Effort: Pulmonary effort is normal. No respiratory distress.      Breath sounds: Normal breath sounds. No wheezing.  Abdominal:     General: Bowel sounds are normal. There is no distension.     Palpations: Abdomen is soft. There is no mass.     Tenderness: There is no abdominal tenderness. There is no guarding or rebound.  Musculoskeletal:        General: No tenderness. Normal range of motion.     Cervical back: Normal range of motion and neck supple.  Skin:    General: Skin is warm.  Neurological:     Mental Status: She is alert and oriented to person, place, and time.  Psychiatric:        Mood and Affect: Affect normal.     LABORATORY DATA:  I have reviewed the data as listed Lab Results  Component Value Date   WBC 6.1 02/28/2022   HGB 10.0 (L) 02/28/2022   HCT 31.4 (L) 02/28/2022   MCV 104.0 (H) 02/28/2022   PLT 113 (L) 02/28/2022   Recent Labs    05/14/21 1351 06/27/21 1236 09/26/21 1317 10/09/21 1258 01/21/22 1200 01/23/22 1256 02/28/22 1116  NA 143 137 138   < > 140 137 138  K 4.4 4.4 3.9   < > 4.7 4.5 3.9  CL 100 98 99   < > 101 98 98  CO2 35* 33* 31   < > 33* 32 31  GLUCOSE 86 76 78   < > 84 77 124*  BUN 16 24* 14   < > 23 25* 20  CREATININE 0.77 0.93 0.69   < > 1.02 0.94 1.00  CALCIUM 10.2 9.3 9.7   < > 10.0 9.3 9.0  GFRNONAA  --  59* >60  --   --  58* 54*  PROT 6.4 6.5  --   --  6.4  --   --   ALBUMIN 4.0 4.0  --   --  4.3  --   --   AST 17 20  --   --  15  --   --   ALT 11 15  --   --  13  --   --   ALKPHOS 78 72  --   --  96  --   --   BILITOT 0.4 0.7  --   --  0.4  --   --   BILIDIR 0.1  --   --   --  0.1  --   --    < > = values in this interval not displayed.    RADIOGRAPHIC STUDIES: I have personally reviewed the radiological images as listed and agreed with the findings in the report. CT BONE MARROW BIOPSY & ASPIRATION  Result Date: 02/07/2022 INDICATION: Anemia. EXAM: CT GUIDED BONE MARROW ASPIRATION AND CORE BIOPSY MEDICATIONS: None. ANESTHESIA/SEDATION: Anesthetic and single agent sedation was employed  during this procedure. A total of fentanyl 25 mcg was administered intravenously. The patient's level of  consciousness and vital signs were monitored continuously by radiology nursing throughout the procedure under my direct supervision. FLUOROSCOPY TIME:  CT dose in mGy was not provided. COMPLICATIONS: None immediate. Estimated blood loss: <5 mL PROCEDURE: Informed written consent was obtained from the the patient and/or patient's representative after a thorough discussion of the procedural risks, benefits and alternatives. All questions were addressed. Maximal Sterile Barrier Technique was utilized including caps, mask, sterile gowns, sterile gloves, sterile drape, hand hygiene and skin antiseptic. A timeout was performed prior to the initiation of the procedure. The patient was positioned LEFT lateral decubitus and non-contrast localization CT was performed of the pelvis to demonstrate the iliac marrow spaces. Maximal barrier sterile technique utilized including caps, mask, sterile gowns, sterile gloves, large sterile drape, hand hygiene, and chlorhexidine prep. Under sterile conditions and local anesthesia, an 11 gauge coaxial bone biopsy needle was advanced into the LEFT iliac marrow space. Needle position was confirmed with CT imaging. Initially, bone marrow aspiration was performed. Next, the 11 gauge outer cannula was utilized to obtain a 2 iliac bone marrow core biopsy. Needle was removed. Hemostasis was obtained with compression. The patient tolerated the procedure well. Samples were prepared with the cytotechnologist. IMPRESSION: 1. Challenging evaluation secondary to difficulty in patient positioning. 2. Successful CT-guided bone marrow aspiration and biopsy, as above Michaelle Birks, MD [facility] Radiology Electronically Signed   By: Michaelle Birks M.D.   On: 02/07/2022 11:49    ASSESSMENT & PLAN:   Symptomatic anemia #  Chronic anemia-unclear  etiology-hemoglobin around 10-mild thrombocytopenia greater than 100 platelets   mild symptoms of ongoing fatigue.  JAN 2023--ferritin 101 saturation 41%%; June 2023 all the peripheral blood shows mild macrocytic anemia-bone marrow biopsy shows Overall normocellular bone marrow with orderly trilineage  hematopoiesis,-  Essentially absent histiocytic iron stores; conventional cytogenetics normal.  Will check [facility] panel.  #I reviewed with the patient that no obvious evidence of malignancy noted on the bone marrow biopsy.  However would recommend awaiting on [facility] testing.  #Given the absence of iron stores I would recommend proceeding with IV Venofer infusion weekly x3.  Hold off Retacrit at this time given patient's stable renal function/and absence of any MDS in the bone marrow.  Offered to speak to [name], as per patient patient's daughter out of town.  Care giver-[place] # DISPOSITION:  # HOLD Retacrit today # venofer weekly x 3 # follow up in 2 months- MD; labs-cbc/bmp;iron studies/ferritin; B12 ;LDH; possible venofer-Dr.B       Cammie Sickle, MD 02/28/2022 3:41 PM

## 2022-02-28 NOTE — Assessment & Plan Note (Addendum)
#  Chronic anemia-unclear etiology-hemoglobin around 10-mild thrombocytopenia greater than 100 platelets   mild symptoms of ongoing fatigue.  JAN 2023--ferritin 101 saturation 41%%; June 2023 all the peripheral blood shows mild macrocytic anemia-bone marrow biopsy shows Overall normocellular bone marrow with orderly trilineage  hematopoiesis,- Essentially absent histiocytic iron stores; conventional cytogenetics normal.  Will check [facility] panel.  #I reviewed with the patient that no obvious evidence of malignancy noted on the bone marrow biopsy.  However would recommend awaiting on [facility] testing.  #Given the absence of iron stores I would recommend proceeding with IV Venofer infusion weekly x3.  Hold off Retacrit at this time given patient's stable renal function/and absence of any MDS in the bone marrow.  Offered to speak to [name], as per patient patient's daughter out of town.  Care giver-[place] # DISPOSITION:  # HOLD Retacrit today # venofer weekly x 3 # follow up in 2 months- MD; labs-cbc/bmp;iron studies/ferritin; B12 ;LDH; possible venofer-Dr.B

## 2022-02-28 NOTE — Progress Notes (Signed)
Patient reports that she twisted her back getting in the car today.

## 2022-03-01 DIAGNOSIS — I10 Essential (primary) hypertension: Secondary | ICD-10-CM

## 2022-03-02 LAB — ERYTHROPOIETIN: Erythropoietin: 16.6 m[IU]/mL (ref 2.6–18.5)

## 2022-03-05 DIAGNOSIS — Z96651 Presence of right artificial knee joint: Secondary | ICD-10-CM | POA: Diagnosis not present

## 2022-03-05 DIAGNOSIS — M25561 Pain in right knee: Secondary | ICD-10-CM | POA: Diagnosis not present

## 2022-03-06 ENCOUNTER — Telehealth: Payer: Self-pay | Admitting: Internal Medicine

## 2022-03-06 ENCOUNTER — Other Ambulatory Visit: Payer: Self-pay | Admitting: Internal Medicine

## 2022-03-06 ENCOUNTER — Other Ambulatory Visit: Payer: Self-pay

## 2022-03-06 MED ORDER — SYNTHROID 100 MCG PO TABS: 100.0000 ug | ORAL_TABLET | Freq: Every day | ORAL | 3 refills | Status: DC

## 2022-03-06 NOTE — Telephone Encounter (Signed)
sent 

## 2022-03-06 NOTE — Telephone Encounter (Signed)
Pt need refill on SYNTHROID [facility]

## 2022-03-06 NOTE — Telephone Encounter (Signed)
Velvet from [facility] called stating pt need an order for an oxygen tank and oxygen concentrator. Pt only has one tank and pt would like to be called when its ordered

## 2022-03-07 ENCOUNTER — Telehealth: Payer: Self-pay | Admitting: Internal Medicine

## 2022-03-07 ENCOUNTER — Ambulatory Visit
Admission: RE | Admit: 2022-03-07 | Discharge: 2022-03-07 | Disposition: A | Discharge status: Home / Self Care | Payer: Medicare HMO | Source: Ambulatory Visit | Attending: Specialist | Admitting: Specialist

## 2022-03-07 ENCOUNTER — Inpatient Hospital Stay: Payer: Medicare HMO | Attending: Internal Medicine

## 2022-03-07 VITALS — BP 135/69 | HR 65 | Temp 98.1°F | Resp 16

## 2022-03-07 DIAGNOSIS — Z86718 Personal history of other venous thrombosis and embolism: Secondary | ICD-10-CM | POA: Insufficient documentation

## 2022-03-07 DIAGNOSIS — Z79899 Other long term (current) drug therapy: Secondary | ICD-10-CM | POA: Diagnosis not present

## 2022-03-07 DIAGNOSIS — Z823 Family history of stroke: Secondary | ICD-10-CM | POA: Diagnosis not present

## 2022-03-07 DIAGNOSIS — Z7952 Long term (current) use of systemic steroids: Secondary | ICD-10-CM | POA: Insufficient documentation

## 2022-03-07 DIAGNOSIS — Z888 Allergy status to other drugs, medicaments and biological substances status: Secondary | ICD-10-CM | POA: Insufficient documentation

## 2022-03-07 DIAGNOSIS — L299 Pruritus, unspecified: Secondary | ICD-10-CM | POA: Diagnosis not present

## 2022-03-07 DIAGNOSIS — Z881 Allergy status to other antibiotic agents status: Secondary | ICD-10-CM | POA: Insufficient documentation

## 2022-03-07 DIAGNOSIS — Z9049 Acquired absence of other specified parts of digestive tract: Secondary | ICD-10-CM | POA: Diagnosis not present

## 2022-03-07 DIAGNOSIS — Z79890 Hormone replacement therapy (postmenopausal): Secondary | ICD-10-CM | POA: Diagnosis not present

## 2022-03-07 DIAGNOSIS — Z882 Allergy status to sulfonamides status: Secondary | ICD-10-CM | POA: Diagnosis not present

## 2022-03-07 DIAGNOSIS — M7122 Synovial cyst of popliteal space [Baker], left knee: Secondary | ICD-10-CM | POA: Insufficient documentation

## 2022-03-07 DIAGNOSIS — Z0389 Encounter for observation for other suspected diseases and conditions ruled out: Secondary | ICD-10-CM | POA: Diagnosis not present

## 2022-03-07 DIAGNOSIS — R21 Rash and other nonspecific skin eruption: Secondary | ICD-10-CM | POA: Insufficient documentation

## 2022-03-07 DIAGNOSIS — I509 Heart failure, unspecified: Secondary | ICD-10-CM | POA: Diagnosis not present

## 2022-03-07 DIAGNOSIS — Z8249 Family history of ischemic heart disease and other diseases of the circulatory system: Secondary | ICD-10-CM | POA: Diagnosis not present

## 2022-03-07 DIAGNOSIS — Z8719 Personal history of other diseases of the digestive system: Secondary | ICD-10-CM | POA: Insufficient documentation

## 2022-03-07 DIAGNOSIS — D500 Iron deficiency anemia secondary to blood loss (chronic): Secondary | ICD-10-CM

## 2022-03-07 DIAGNOSIS — D539 Nutritional anemia, unspecified: Secondary | ICD-10-CM | POA: Diagnosis present

## 2022-03-07 DIAGNOSIS — M1990 Unspecified osteoarthritis, unspecified site: Secondary | ICD-10-CM | POA: Insufficient documentation

## 2022-03-07 DIAGNOSIS — I110 Hypertensive heart disease with heart failure: Secondary | ICD-10-CM | POA: Diagnosis not present

## 2022-03-07 DIAGNOSIS — I272 Pulmonary hypertension, unspecified: Secondary | ICD-10-CM | POA: Insufficient documentation

## 2022-03-07 DIAGNOSIS — E039 Hypothyroidism, unspecified: Secondary | ICD-10-CM | POA: Insufficient documentation

## 2022-03-07 MED ORDER — SODIUM CHLORIDE 0.9 % IV SOLN
Freq: Once | INTRAVENOUS | Status: AC
  Filled 2022-03-07: qty 250

## 2022-03-07 MED ORDER — SODIUM CHLORIDE 0.9 % IV SOLN
200.0000 mg | Freq: Once | INTRAVENOUS | Status: AC
  Administered 2022-03-07: 200 mg via INTRAVENOUS
  Filled 2022-03-07: qty 10

## 2022-03-07 NOTE — Telephone Encounter (Signed)
Patient had xrays dropped off for Dr Nicki Reaper. Xrays are up front in Dr Bary Leriche color folder.

## 2022-03-07 NOTE — Telephone Encounter (Signed)
Per Gwenlyn Perking at [facility] - pt insurance is in network with [employer]. Order form faxed for [employer], supplies  and tanks.

## 2022-03-07 NOTE — Telephone Encounter (Signed)
S/w pt - was previously getting oxygen and supplies from [place]. Kimberly Roy is no longer in network with [employer].

## 2022-03-11 NOTE — Telephone Encounter (Signed)
New supplier - [employer] [phone] Will reach out to see status of order

## 2022-03-11 NOTE — Telephone Encounter (Signed)
Patient called and said she received a phone call from her oxygen suplier. They told her she had 5 days remaining. Patient was advised that it could be next week until new supplier delivers. Patient is going to call her supplier and see if it can be extended.

## 2022-03-12 ENCOUNTER — Inpatient Hospital Stay: Payer: Medicare HMO

## 2022-03-12 ENCOUNTER — Encounter: Payer: Self-pay | Admitting: Dermatology

## 2022-03-12 ENCOUNTER — Ambulatory Visit: Payer: Medicare HMO | Admitting: Dermatology

## 2022-03-12 VITALS — BP 129/55 | HR 66 | Temp 97.9°F | Resp 18

## 2022-03-12 DIAGNOSIS — D692 Other nonthrombocytopenic purpura: Secondary | ICD-10-CM

## 2022-03-12 DIAGNOSIS — D500 Iron deficiency anemia secondary to blood loss (chronic): Secondary | ICD-10-CM

## 2022-03-12 DIAGNOSIS — L720 Epidermal cyst: Secondary | ICD-10-CM | POA: Diagnosis not present

## 2022-03-12 DIAGNOSIS — R21 Rash and other nonspecific skin eruption: Secondary | ICD-10-CM

## 2022-03-12 DIAGNOSIS — I509 Heart failure, unspecified: Secondary | ICD-10-CM | POA: Diagnosis not present

## 2022-03-12 DIAGNOSIS — L230 Allergic contact dermatitis due to metals: Secondary | ICD-10-CM | POA: Diagnosis not present

## 2022-03-12 DIAGNOSIS — L309 Dermatitis, unspecified: Secondary | ICD-10-CM | POA: Diagnosis not present

## 2022-03-12 DIAGNOSIS — I110 Hypertensive heart disease with heart failure: Secondary | ICD-10-CM | POA: Diagnosis not present

## 2022-03-12 DIAGNOSIS — M1990 Unspecified osteoarthritis, unspecified site: Secondary | ICD-10-CM | POA: Diagnosis not present

## 2022-03-12 DIAGNOSIS — L299 Pruritus, unspecified: Secondary | ICD-10-CM | POA: Diagnosis not present

## 2022-03-12 DIAGNOSIS — E039 Hypothyroidism, unspecified: Secondary | ICD-10-CM | POA: Diagnosis not present

## 2022-03-12 DIAGNOSIS — M7122 Synovial cyst of popliteal space [Baker], left knee: Secondary | ICD-10-CM | POA: Diagnosis not present

## 2022-03-12 DIAGNOSIS — Z7952 Long term (current) use of systemic steroids: Secondary | ICD-10-CM | POA: Diagnosis not present

## 2022-03-12 DIAGNOSIS — I272 Pulmonary hypertension, unspecified: Secondary | ICD-10-CM | POA: Diagnosis not present

## 2022-03-12 MED ORDER — CLOBETASOL PROPIONATE 0.05 % EX CREA: TOPICAL_CREAM | CUTANEOUS | 2 refills | Status: DC

## 2022-03-12 MED ORDER — SODIUM CHLORIDE 0.9 % IV SOLN
Freq: Once | INTRAVENOUS | Status: AC
  Filled 2022-03-12: qty 250

## 2022-03-12 MED ORDER — SODIUM CHLORIDE 0.9 % IV SOLN
200.0000 mg | Freq: Once | INTRAVENOUS | Status: AC
  Administered 2022-03-12: 200 mg via INTRAVENOUS
  Filled 2022-03-12: qty 10

## 2022-03-12 NOTE — Patient Instructions (Addendum)
[facility] CANCER CTR AT [place]  Discharge Instructions: Thank you for choosing [facility] to provide your oncology and hematology care.  If you have a lab appointment with the [facility], please go directly to the [facility] and check in at the registration area.  Wear comfortable clothing and clothing appropriate for easy access to any Portacath or PICC line.   We strive to give you quality time with your provider. You may need to reschedule your appointment if you arrive late (15 or more minutes).  Arriving late affects you and other patients whose appointments are after yours.  Also, if you miss three or more appointments without notifying the office, you may be dismissed from the clinic at the provider's discretion.      For prescription refill requests, have your pharmacy contact our office and allow 72 hours for refills to be completed.    Today you received the following chemotherapy and/or immunotherapy agents VENOFER      To help prevent nausea and vomiting after your treatment, we encourage you to take your nausea medication as directed.  BELOW ARE SYMPTOMS THAT SHOULD BE REPORTED IMMEDIATELY: *FEVER GREATER THAN 100.4 F (38 C) OR HIGHER *CHILLS OR SWEATING *NAUSEA AND VOMITING THAT IS NOT CONTROLLED WITH YOUR NAUSEA MEDICATION *UNUSUAL SHORTNESS OF BREATH *UNUSUAL BRUISING OR BLEEDING *URINARY PROBLEMS (pain or burning when urinating, or frequent urination) *BOWEL PROBLEMS (unusual diarrhea, constipation, pain near the anus) TENDERNESS IN MOUTH AND THROAT WITH OR WITHOUT PRESENCE OF ULCERS (sore throat, sores in mouth, or a toothache) UNUSUAL RASH, SWELLING OR PAIN  UNUSUAL VAGINAL DISCHARGE OR ITCHING   Items with * indicate a potential emergency and should be followed up as soon as possible or go to the Emergency Department if any problems should occur.  Please show the CHEMOTHERAPY ALERT CARD or IMMUNOTHERAPY ALERT CARD at check-in to the  Emergency Department and triage nurse.  Should you have questions after your visit or need to cancel or reschedule your appointment, please contact [facility] CANCER [place] AT [place]  [phone] and follow the prompts.  Office hours are 8:00 a.m. to 4:30 p.m. Monday - Friday. Please note that voicemails left after 4:00 p.m. may not be returned until the following business day.  We are closed weekends and major holidays. You have access to a nurse at all times for urgent questions. Please call the main number to the clinic [phone] and follow the prompts.  For any non-urgent questions, you may also contact your provider using MyChart. We now offer e-Visits for anyone 17 and older to request care online for non-urgent symptoms. For details visit [email or web address].   Also download the MyChart app! Go to the app store, search "MyChart", open the app, select [facility], and log in with your MyChart username and password.  Masks are optional in the cancer centers. If you would like for your care team to wear a mask while they are taking care of you, please let them know. For doctor visits, patients may have with them one support person who is at least 86 years old. At this time, visitors are not allowed in the infusion area.  Iron Sucrose Injection ?? ??? ??????? ????? ????? ?????? ?????? ??????? ?????? (??? ???? ?????? ?? ??? ??????) ??? ??????? ???????? ?????? ?????.  ??????? ?? ???? ???? ????? ????? ?? ????? ????? ???? ??????? ???? ???? ???????? ?? ????? ??? ???? ????? ?????. ???? ??????? ??? ?????? ?????? ????? ???? ???? ??????? ?????? ?? ??????? ??? ???? ???? ?????. ????? ???????? ???????? ????????:?  [facility]er ?? ?? ??????? ???? ??? ?? ???? ??? ???? ??????? ?????? ??? ????? ??? ???????  ?? ????? ??? ????? ?? ??? ??? ?????? ??? ?? ??? ??????? ????:  ??? ???? ??? ?????? ?? ?????? ??????? ??????  ????? ?????  ??????? ?????? ?? ?????? ?? ????  ????? ?????  ????? ?????  ?? ???  ??? ???? ?? ?????? ?? ??????? ?? ??????? ??????? ?? ???????? ?? ???????? ?? ?????? ???????  ????? ?? ?????? ????? ??????? ???????? ??? ??? ???? ??????? ??? ??????? ??? ?????? ???? ??????? ???? ????.  ??? ?????? ?? ?????? ?? ?????. ???? ??? ???? ??????? ???? ??????? ??????? ??? ?????? ???????.  ?? ??? ?? ??? ?????? ???? ?? ???? ??????? ????? ?????? ?? ????? 2 ????? ????? ????? ??????? ???????? ??? ??? ???? ????? ?????????? ???????. ?????? ??????? : ??? ?????? ??? ?????? ???? ????? ???? ?? ??? ??????? ???? ????? ?????? ?? ?????? ?? ???? ??????? ?????. ??????: ?????? ??? ?????? ?? ???? ??? ???. ?? ????? ????? ??? ?? ????? ??? ??????. ???? ?? ???? (????) ????? ?? ????? ??? ???? ????.  ???? ????? ??????? ??? ??? ??? ???? ??? ???????? ??? ??? ????????. ?? ?? ??????? ???? ?? ?????? ?? ??? ???????  ?? ????? ??? ?????? ?? ?? ??? ???:  ????????????  ??????????  ?????? ?????? ?????? ??? ?????? ???? ?? ?????? ????? ?? ??????? ???????:  ????????????? ???????????? ??? ??????? ?? ?? ??? ?? ????????? ????????. ?? ?????? ???? ??????? ?????? ????? ??? ??????? ?? ??????? ?? ??????? ???? ???? ?? ???????? ???????? ???? ????????. ?????? ????? ??? ??? ???? ?? ???? ?????? ?? ?????? ?????? ??? ???????. ?? ?????? ??? ??????? ?? ?????. ?? ???? ??? ???? ????? ??? ??????? ??? ??????? ?? ?????? ???? ??????? ???????.  ???? ???? ??????? ??? ?? ???? ?????? ?? ?????? ?? ??? ?????? ?????.  ??? ????? ??? ????? ???????? ?????? ???? ????? ?????? ???? ??????. ?? ????? ??? ????? ???? ????? ???.  ???? ??? ???? ???????.  ???? ??????? ???? ????? ??? ??????:  ?????? ??????? ???????? ??????? ?????????? ?? ???????? ?????? ???????? ??????? ??????? (????? ??????). ?? ?? ?????? ???????? ???? ???? ?? ??????? ??? ???? ??? ???????  ?????? ???????? ???? ??? ?? ???? ???? ??????? ??????? ?? ??? ?? ???? ??? ????:  ??????? ????????--????? ??????? ?????? ??????????? (?????)? ???? ????? ?? ?????? ?? ?????? ?? ?????  ?????? ??? ????--??????? ??????  ???????? ?? ??????? ??? ???? ??????  ??? ?????? ?????? ???????? ???? ?? ????? ????? ???? (???? ????? ?? ???? ??????? ??? ?????? ?? ???? ????? ?????):  ?????? ?????  ??????  ??? ???????  ??? ???????  ??????? ??? ?? ?????? ?? ?????? ?? ???? ?????? ??? ??????? ?? ?? ??? ?? ?????? ???????? ????????. ???? ?????? ????????? ?????? ?? ?????? ????????. ????? ??????? ?? ?????? ???????? ?????? ??????? ???????? ????????? (FDA) ??? ????? [id number]??? ??? ??? ???? ???????? ??????? ??? ????? ??? ?????? ?? ???? ?????? ????? ???? ?? ?????? ?? ?????.  ?? ??? ?????? ??? ?????? ??????? ?? ??????. ??????: ??? ??????? ????? ?? ????: ?? ?? ???? ??? ???????? ?? ????????? ???????. ??? ???? ???? ?? ????? ?? ??? ??????? ????? ??? ?????? ?? ??????? ?? ???? ??????? ??????.   2023 Elsevier/Gold Standard (2021-03-23 00:00:00)

## 2022-03-12 NOTE — Patient Instructions (Addendum)
Start Clobetasol cream twice daily to affected areas up to 2 weeks as needed for rash/itching. Avoid applying to face, groin, and axilla. Use as directed. Long-term use can cause thinning of the skin. Apply to affected areas on scalp as needed.   Continue Ketoconazole 2% shampoo 2-3 times a week as directed.   Topical steroids (such as triamcinolone, fluocinolone, fluocinonide, mometasone, clobetasol, halobetasol, betamethasone, hydrocortisone) can cause thinning and lightening of the skin if they are used for too long in the same area. Your physician has selected the right strength medicine for your problem and area affected on the body. Please use your medication only as directed by your physician to prevent side effects.    Recommend OTC Gold Bond Rapid Relief Anti-Itch cream (pramoxine + menthol), CeraVe Anti-itch cream or lotion (pramoxine), Sarna lotion (Original- menthol + camphor or Sensitive- pramoxine) or Eucerin 12 hour Itch Relief lotion (menthol) up to 3 times per day to areas on body that are itchy.    Wound Care Instructions  Cleanse wound gently with soap and water once a day then pat dry with clean gauze. Apply a thing coat of Petrolatum (petroleum jelly, "Vaseline") over the wound (unless you have an allergy to this). We recommend that you use a new, sterile tube of Vaseline. Do not pick or remove scabs. Do not remove the yellow or white "healing tissue" from the base of the wound.  Cover the wound with fresh, clean, nonstick gauze and secure with paper tape. You may use Band-Aids in place of gauze and tape if the would is small enough, but would recommend trimming much of the tape off as there is often too much. Sometimes Band-Aids can irritate the skin.  You should call the office for your biopsy report after 1 week if you have not already been contacted.  If you experience any problems, such as abnormal amounts of bleeding, swelling, significant bruising, significant pain, or  evidence of infection, please call the office immediately.  FOR ADULT SURGERY PATIENTS: If you need something for pain relief you may take 1 extra strength Tylenol (acetaminophen) AND 2 Ibuprofen (255m each) together every 4 hours as needed for pain. (do not take these if you are allergic to them or if you have a reason you should not take them.) Typically, you may only need pain medication for 1 to 3 days.        Pre-Operative Instructions  [facility] We recommend you read the following instructions. If you have any questions or concerns, please call the office at [phone]  Shower and wash the entire body with soap and water the day of your surgery paying special attention to cleansing at and around the planned surgery site.  Avoid aspirin or aspirin containing products at least fourteen (14) days prior to your surgical procedure and for at least one week (7 Days) after your surgical procedure. If you take aspirin on a regular basis for heart disease or history of stroke or for any other reason, we may recommend you continue taking aspirin but please notify u[place]if you take this on a regular basis. Aspirin can cause more bleeding to occur during surgery as well as prolonged bleeding and bruising after surgery.   Avoid other nonsteroidal pain medications at least one week prior to surgery and at least one week prior to your surgery. These include medications such as Ibuprofen (Motrin, Advil and Nuprin), Naprosyn, Voltaren, Relafen, etc. If medications  are used for therapeutic reasons, please inform us as they can cause increased bleeding or prolonged bleeding during and bruising after surgical procedures.   Please advise [place] if you are taking any "blood thinner" medications such as Coumadin or Dipyridamole or Plavix or similar medications. These cause increased bleeding and prolonged bleeding during procedures and bruising after surgical  procedures. We may have to consider discontinuing these medications briefly prior to and shortly after your surgery if safe to do so.   Please inform us of all medications you are currently taking. All medications that are taken regularly should be taken the day of surgery as you always do. Nevertheless, we need to be informed of what medications you are taking prior to surgery to know whether they will affect the procedure or cause any complications.   Please inform us of any medication allergies. Also inform us of whether you have allergies to Latex or rubber products or whether you have had any adverse reaction to Lidocaine or Epinephrine.  Please inform us of any prosthetic or artificial body parts such as artificial heart valve, joint replacements, etc., or similar condition that might require preoperative antibiotics.   We recommend avoidance of alcohol at least two weeks prior to surgery and continued avoidance for at least two weeks after surgery.   We recommend discontinuation of tobacco smoking at least two weeks prior to surgery and continued abstinence for at least two weeks after surgery.  Do not plan strenuous exercise, strenuous work or strenuous lifting for approximately four weeks after your surgery.   We request if you are unable to make your scheduled surgical appointment, please call us at least a week in advance or as soon as you are aware of a problem so that we can cancel or reschedule the appointment.   You MAY TAKE TYLENOL (acetaminophen) for pain as it is not a blood thinner.   PLEASE PLAN TO BE IN TOWN FOR TWO WEEKS FOLLOWING SURGERY, THIS IS IMPORTANT SO YOU CAN BE CHECKED FOR DRESSING CHANGES, SUTURE REMOVAL AND TO MONITOR FOR POSSIBLE COMPLICATIONS.   Gentle Skin Care Guide  1. Bathe no more than once a day.  2. Avoid bathing in hot water  3. Use a mild soap like Dove, Vanicream, Cetaphil, CeraVe. Can use Lever 2000 or Cetaphil antibacterial soap  4. Use soap  only where you need it. On most days, use it under your arms, between your legs, and on your feet. Let the water rinse other areas unless visibly dirty.  5. When you get out of the bath/shower, use a towel to gently blot your skin dry, don't rub it.  6. While your skin is still a little damp, apply a moisturizing cream such as Vanicream, CeraVe, Cetaphil, Eucerin, Sarna lotion or plain Vaseline Jelly. For hands apply Neutrogena [place] Hand Cream or Excipial Hand Cream.  7. Reapply moisturizer any time you start to itch or feel dry.  8. Sometimes using free and clear laundry detergents can be helpful. Fabric softener sheets should be avoided. Downy Free & Gentle liquid, or any liquid fabric softener that is free of dyes and perfumes, it acceptable to use  9. If your doctor has given you prescription creams you may apply moisturizers over them      Due to recent changes in healthcare laws, you may see results of your pathology and/or laboratory studies on MyChart before the doctors have had a chance to review them. We understand that in some cases there may be  results that are confusing or concerning to you. Please understand that not all results are received at the same time and often the doctors may need to interpret multiple results in order to provide you with the best plan of care or course of treatment. Therefore, we ask that you please give [place] 2 business days to thoroughly review all your results before contacting the office for clarification. Should we see a critical lab result, you will be contacted sooner.   If You Need Anything After Your Visit  If you have any questions or concerns for your doctor, please call our main line at [phone] and press option 4 to reach your doctor's medical assistant. If no one answers, please leave a voicemail as directed and we will return your call as soon as possible. Messages left after 4 pm will be answered the following business day.   You may  also send [place] a message via [place]. We typically respond to MyChart messages within 1-2 business days.  For prescription refills, please ask your pharmacy to contact our office. Our fax number is [phone].  If you have an urgent issue when the clinic is closed that cannot wait until the next business day, you can page your doctor at the number below.    Please note that while we do our best to be available for urgent issues outside of office hours, we are not available 24/7.   If you have an urgent issue and are unable to reach [place], you may choose to seek medical care at your doctor's office, retail clinic, urgent care center, or emergency room.  If you have a medical emergency, please immediately call 911 or go to the emergency department.  Pager Numbers  - Dr. Nehemiah Massed: [phone]  - Dr. Laurence Ferrari: [phone]  - Dr. Nicole Kindred: [phone]  In the event of inclement weather, please call our main line at [phone] for an update on the status of any delays or closures.  Dermatology Medication Tips: Please keep the boxes that topical medications come in in order to help keep track of the instructions about where and how to use these. Pharmacies typically print the medication instructions only on the boxes and not directly on the medication tubes.   If your medication is too expensive, please contact our office at [phone] option 4 or send [place] a message through [place].   We are unable to tell what your co-pay for medications will be in advance as this is different depending on your insurance coverage. However, we may be able to find a substitute medication at lower cost or fill out paperwork to get insurance to cover a needed medication.   If a prior authorization is required to get your medication covered by your insurance company, please allow [place] 1-2 business days to complete this process.  Drug prices often vary depending on where the prescription is filled and some pharmacies  may offer cheaper prices.  The website www.goodrx.com contains coupons for medications through different pharmacies. The prices here do not account for what the cost may be with help from insurance (it may be cheaper with your insurance), but the website can give you the price if you did not use any insurance.  - You can print the associated coupon and take it with your prescription to the pharmacy.  - You may also stop by our office during regular business hours and pick up a GoodRx coupon card.  - If you need your prescription sent electronically to a different  pharmacy, notify our office through [facility] or by phone at [phone] option 4.     Si Usted Necesita Algo Despus de Su Visita  Tambin puede enviarnos un mensaje a travs de Pharmacist, community. Por lo general respondemos a los mensajes de MyChart en el transcurso de 1 a 2 das hbiles.  Para renovar recetas, por favor pida a su farmacia que se ponga en contacto con nuestra oficina. Harland Dingwall de fax es [place] [phone].  Si tiene un asunto urgente cuando la clnica est cerrada y que no puede esperar hasta el siguiente da hbil, puede llamar/localizar a su doctor(a) al nmero que aparece a continuacin.   Por favor, tenga en cuenta que aunque hacemos todo lo posible para estar disponibles para asuntos urgentes fuera del horario de [place], no estamos disponibles las 24 horas del da, los 7 das de la [place].   Si tiene un problema urgente y no puede comunicarse con nosotros, puede optar por buscar atencin mdica  en el consultorio de su doctor(a), en una clnica privada, en un centro de atencin urgente o en una sala de emergencias.  Si tiene Engineering geologist, por favor llame inmediatamente al 911 o vaya a la sala de emergencias.  Nmeros de bper  - Dr. Nehemiah Massed: [phone]  - Dra. Moye: [phone]  - Dra. Nicole Kindred: [phone]  En caso de inclemencias del [place], por favor llame a Johnsie Kindred principal  al [phone] para una actualizacin sobre el [place] de cualquier retraso o cierre.  Consejos para la medicacin en dermatologa: Por favor, guarde las cajas en las que vienen los medicamentos de uso tpico para ayudarle a seguir las instrucciones sobre dnde y cmo usarlos. Las farmacias generalmente imprimen las instrucciones del medicamento slo en las cajas y no directamente en los tubos del [place].   Si su medicamento es muy caro, por favor, pngase en contacto con Zigmund Daniel llamando al [phone] y presione la opcin 4 o envenos un mensaje a travs de Pharmacist, community.   No podemos decirle cul ser su copago por los medicamentos por adelantado ya que esto es diferente dependiendo de la cobertura de su seguro. Sin embargo, es posible que podamos encontrar un medicamento sustituto a Electrical engineer un formulario para que el seguro cubra el medicamento que se considera necesario.   Si se requiere una autorizacin previa para que su compaa de seguros [place] su medicamento, por favor permtanos de 1 a 2 das hbiles para completar este proceso.  Los precios de los medicamentos varan con frecuencia dependiendo del Environmental consultant de dnde se surte la receta y alguna farmacias pueden ofrecer precios ms baratos.  El sitio web www.goodrx.com tiene cupones para medicamentos de Airline pilot. Los precios aqu no tienen en cuenta lo que podra costar con la ayuda del seguro (puede ser ms barato con su seguro), pero el sitio web puede darle el precio si no utiliz Research scientist (physical sciences).  - Puede imprimir el cupn correspondiente y llevarlo con su receta a la farmacia.  - Tambin puede pasar por nuestra oficina durante el horario de atencin regular y Charity fundraiser una tarjeta de cupones de GoodRx.  - Si necesita que su receta se enve electrnicamente a una farmacia diferente, informe a nuestra oficina a travs de MyChart de [place] o por telfono llamando al [phone] y presione la opcin 4.

## 2022-03-12 NOTE — Progress Notes (Signed)
Follow-Up Visit   Subjective  Kimberly Roy is a 88 y.o. female who presents for the following: Rash (Breaking out all over. Dur: 1 week. Has come and gone for several weeks. Has used OTC HC cream as needed. Started Meloxicam 10 days ago, caused stomach upset, stopped 5 days ago).  The patient has spots, moles and lesions to be evaluated, some may be new or changing and the patient has concerns that these could be cancer.  Caregiver, Levada Dy, with patient.   The following portions of the chart were reviewed this encounter and updated as appropriate:  Tobacco  Allergies  Meds  Problems  Med Hx  Surg Hx  Fam Hx      Review of Systems: No other skin or systemic complaints except as noted in HPI or Assessment and Plan.   Objective  Well appearing patient in no apparent distress; mood and affect are within normal limits.  A full examination was performed including scalp, head, eyes, ears, nose, lips, neck, chest, axillae, abdomen, back, buttocks, bilateral upper extremities, bilateral lower extremities, hands, feet, fingers, toes, fingernails, and toenails. All findings within normal limits unless otherwise noted below.  left upper back Excoriated pink papules and plaques at neck, upper back, scalp, feet, ankles  B/L earlobes No reaction today  upper mid back Subcutaneous nodule with cental open punctum   Assessment & Plan  Rash left upper back  Insect bites vs ACD vs eczema vs hypersensitivity reaction  Exam most suggestive of bite reaction at feet, eczematous process at back  Discussed treatment vs biopsy. Pt prefers biopsy today.    Skin / nail biopsy - left upper back Type of biopsy: punch   Informed consent: discussed and consent obtained   Timeout: patient name, date of birth, surgical site, and procedure verified   Procedure prep:  Patient was prepped and draped in usual sterile fashion Prep type:  Isopropyl alcohol Anesthesia: the lesion was anesthetized in a  standard fashion   Anesthetic:  1% lidocaine w/ epinephrine 1-100,000 buffered w/ 8.4% NaHCO3 Punch size:  4 mm Suture size:  4-0 Suture type: Prolene (polypropylene)   Hemostasis achieved with: suture   Outcome: patient tolerated procedure well   Post-procedure details: sterile dressing applied and wound care instructions given   Dressing type: bacitracin and pressure dressing    clobetasol cream (TEMOVATE) 0.05 % - left upper back Apply twice daily to affected areas up to 2 weeks as needed for rash/itching. Avoid applying to face, groin, and axilla.  Specimen 1 - Surgical pathology Differential Diagnosis: Insect bites vs allergic contact dermatitis vs eczema vs hypersensitivity reaction  Check Margins: No  Allergic contact dermatitis due to metals B/L earlobes  History of recurrent rash to gold earrings (not gold plated). Avoid wearing gold earrings. Discussed option of patch testing.  Can try silver earrings or titanium and coat earring post with vaseline jelly to help put them in (avoid neosporin for this due to risk of contact allergy).  Epidermal inclusion cyst upper mid back  Benign-appearing. Exam most consistent with an epidermal inclusion cyst. Discussed that a cyst is a benign growth that can grow over time and sometimes get irritated or inflamed. Recommend observation if it is not bothersome. Discussed option of surgical excision to remove it if it is growing, symptomatic, or other changes noted. Please call for new or changing lesions so they can be evaluated.  Patient will schedule for surgery.   Itch Left Breast  Start Clobetasol cream  twice daily to affected areas up to 2 weeks as needed for rash/itching. Avoid applying to face, groin, and axilla. Use as directed. Long-term use can cause thinning of the skin.  Topical steroids (such as triamcinolone, fluocinolone, fluocinonide, mometasone, clobetasol, halobetasol, betamethasone, hydrocortisone) can cause thinning  and lightening of the skin if they are used for too long in the same area. Your physician has selected the right strength medicine for your problem and area affected on the body. Please use your medication only as directed by your physician to prevent side effects.   Continue Ketoconazole 2% shampoo 2-3 times a week as directed for scalp.   Purpura - Chronic; persistent and recurrent.  Treatable, but not curable. - Violaceous macules and patches - Benign - Related to trauma, age, sun damage and/or use of blood thinners, chronic use of topical and/or oral steroids - Observe - Can use OTC arnica containing moisturizer such as Dermend Bruise Formula if desired - Call for worsening or other concerns   Return for Rash Follow Up in 3-4 weeks.  I, Emelia Salisbury, CMA, am acting as scribe for Forest Gleason, MD.  Documentation: I have reviewed the above documentation for accuracy and completeness, and I agree with the above.  Forest Gleason, MD

## 2022-03-13 ENCOUNTER — Inpatient Hospital Stay (HOSPITAL_BASED_OUTPATIENT_CLINIC_OR_DEPARTMENT_OTHER): Payer: Medicare HMO | Admitting: Medical Oncology

## 2022-03-13 ENCOUNTER — Telehealth: Payer: Self-pay | Admitting: Internal Medicine

## 2022-03-13 ENCOUNTER — Telehealth: Payer: Self-pay | Admitting: *Deleted

## 2022-03-13 DIAGNOSIS — I272 Pulmonary hypertension, unspecified: Secondary | ICD-10-CM | POA: Diagnosis not present

## 2022-03-13 DIAGNOSIS — Z7952 Long term (current) use of systemic steroids: Secondary | ICD-10-CM | POA: Diagnosis not present

## 2022-03-13 DIAGNOSIS — M1990 Unspecified osteoarthritis, unspecified site: Secondary | ICD-10-CM | POA: Diagnosis not present

## 2022-03-13 DIAGNOSIS — I509 Heart failure, unspecified: Secondary | ICD-10-CM | POA: Diagnosis not present

## 2022-03-13 DIAGNOSIS — I110 Hypertensive heart disease with heart failure: Secondary | ICD-10-CM | POA: Diagnosis not present

## 2022-03-13 DIAGNOSIS — L282 Other prurigo: Secondary | ICD-10-CM

## 2022-03-13 DIAGNOSIS — L299 Pruritus, unspecified: Secondary | ICD-10-CM | POA: Diagnosis not present

## 2022-03-13 DIAGNOSIS — R21 Rash and other nonspecific skin eruption: Secondary | ICD-10-CM | POA: Diagnosis not present

## 2022-03-13 DIAGNOSIS — E039 Hypothyroidism, unspecified: Secondary | ICD-10-CM | POA: Diagnosis not present

## 2022-03-13 DIAGNOSIS — M7122 Synovial cyst of popliteal space [Baker], left knee: Secondary | ICD-10-CM | POA: Diagnosis not present

## 2022-03-13 MED ORDER — PREDNISONE 10 MG PO TABS: 10.0000 mg | ORAL_TABLET | Freq: Every day | ORAL | 0 refills | Status: DC

## 2022-03-13 NOTE — Telephone Encounter (Signed)
Spoke to daughter. She is not currently with the patient, but she agreed to the virtual visit. Daughter stated that her cousin was with the patient and could assist pt with the virtual visit. Appointment scheduled.

## 2022-03-13 NOTE — Progress Notes (Signed)
Virtual Visit Progress Note  Kimberly Roy,you are scheduled for a virtual visit with your provider today.    Just as we do with appointments in the office, we must obtain your consent to participate.  Your consent will be active for this visit and any virtual visit you may have with one of our providers in the next 365 days.    If you have a MyChart account, I can also send a copy of this consent to you electronically.  All virtual visits are billed to your insurance company just like a traditional visit in the office.  As this is a virtual visit, video technology does not allow for your provider to perform a traditional examination.  This may limit your provider's ability to fully assess your condition.  If your provider identifies any concerns that need to be evaluated in person or the need to arrange testing such as labs, EKG, etc, we will make arrangements to do so.    Although advances in technology are sophisticated, we cannot ensure that it will always work on either your end or our end.  If the connection with a video visit is poor, we may have to switch to a telephone visit.  With either a video or telephone visit, we are not always able to ensure that we have a secure connection.   I need to obtain your verbal consent now.   Are you willing to proceed with your visit today?   Kimberly Roy has provided verbal consent on 03/13/2022 for a virtual visit (video or telephone).   Hughie Closs, PA-C 03/13/2022  2:23 PM    I connected with Kimberly Roy on 03/13/22 at  1:30 PM EDT by video enabled telemedicine visit and verified that I am speaking with the correct person using two identifiers.   I discussed the limitations, risks, security and privacy concerns of performing an evaluation and management service by telemedicine and the availability of in-person appointments. I also discussed with the patient that there may be a patient responsible charge related to this service. The patient  expressed understanding and agreed to proceed.   Other persons participating in the visit and their role in the encounter: Cousin,   Patient's location: Home Provider's location: Home  Chief Complaint: Rash   Rash: Patient states that she has noticed a rash of her entire body-head to toe- since around the time of her first Venofer infusion in July. Previously felt tired after these infusions but never had a rash. Rash is scattered small red dots. Itchy. 1-2 small that may look like clear fluid filled blisters. No fevers, SOB. Saw dermatology yesterday who thought this rash may be due to meloxicam and she was given a steroid cream which has helped some. Has stopped the meloxicam.      Patient Care Team: Einar Pheasant, [name], [name], [name], [name], [name], [name]   Name of the patient: Kimberly Roy  [id number]  [birth year]   Date of visit: 03/13/22  History of Presenting Illness-   Review of systems- ROS   Allergies  Allergen Reactions   Lyrica [Pregabalin] Swelling   Omnicef [Cefdinir] Diarrhea and Nausea And Vomiting   Atarax [Hydroxyzine]     jittery   Dicyclomine Other (See Comments)    Abdominal bloating  Hydroxyzine Hcl     jittery   Levaquin [Levofloxacin] Swelling   Nitrofurantoin Diarrhea   Nucynta Er [Tapentadol Hcl Er] Other (See Comments)    Severe constipation    Oxybutynin Other (See Comments)    Blurred vision   Zoloft [Sertraline Hcl]     Severe headache   Biaxin [Clarithromycin] Other (See Comments) and Rash    Pt does not remember Pt does not remember   Sertraline Nausea And Vomiting    Severe headache Severe headache Other reaction(s): Headache Severe headache   Sulfa Antibiotics Rash    Pt does not remember    Sulfasalazine Rash    Pt does not remember   Tape Rash    Durabond - redness   Tapentadol Other (See Comments) and Rash    _0     Past Medical History:  Diagnosis Date   Anemia    Anxiety    Chest pain    CHF (congestive heart failure) (HCC)    Constipation    DDD (degenerative disc disease), cervical    Depression    DVT (deep venous thrombosis) (HCC)    Dysphonia    Dyspnea    Fatty liver    Fatty liver    Headache    Hyperlipidemia    Hyperpiesia    Hypertension    Hypothyroidism    Interstitial cystitis    Left ventricular dysfunction    Lymphedema    Nephrolithiasis    Obstructive sleep apnea    Osteoarthritis    knees/cervical and lumbar spine   Pulmonary hypertension (HCC)    Pulmonary nodules    followed by Dr Raul Del   Pure hypercholesterolemia    Renal cyst    right    Past Surgical History:  Procedure Laterality Date   ABDOMINAL HYSTERECTOMY     ovaries left in place   APPENDECTOMY     Back Surgeries     BACK SURGERY     BREAST REDUCTION SURGERY     3/99   CARDIAC CATHETERIZATION     cataracts Bilateral    CERVICAL SPINE SURGERY     ESOPHAGEAL MANOMETRY N/A 08/02/2015   Procedure: ESOPHAGEAL MANOMETRY (EM);  Surgeon: Josefine Class, MD;  Location: [facility] ENDOSCOPY;  Service: Endoscopy;  Laterality: N/A;   ESOPHAGOGASTRODUODENOSCOPY N/A 02/27/2015   Procedure: ESOPHAGOGASTRODUODENOSCOPY (EGD);  Surgeon: Hulen Luster, MD;  Location: [facility] ENDOSCOPY;  Service: Gastroenterology;  Laterality: N/A;   ESOPHAGOGASTRODUODENOSCOPY (EGD) WITH PROPOFOL N/A 10/30/2018   Procedure: ESOPHAGOGASTRODUODENOSCOPY (EGD) WITH PROPOFOL;  Surgeon: Lollie Sails, MD;  Location: [facility] END[facility]COPY;  Service: Endoscopy;  Laterality: N/A;   EXCISIONAL HEMORRHOIDECTOMY     EYE SURGERY     FRACTURE SURGERY     HEMORRHOID SURGERY     HIP SURGERY  2013   Right hip surgery    JOINT REPLACEMENT     KNEE ARTHROSCOPY     left and right   ORIF FEMUR FRACTURE Right 08/07/2018   Procedure: OPEN REDUCTION INTERNAL FIXATION (ORIF) DISTAL FEMUR FRACTURE;  Surgeon: Hessie Knows, MD;  Location: [facility] ORS;  Service: Orthopedics;  Laterality: Right;   REDUCTION MAMMAPLASTY Bilateral YRS AGO   REPLACEMENT TOTAL KNEE Bilateral    rotator cuff surgery     blilateral   TONSILECTOMY/ADENOIDECTOMY WITH MYRINGOTOMY     VISCERAL ARTERY INTERVENTION N/A 02/25/2019   Procedure: VISCERAL ARTERY INTERVENTION;  Surgeon: Algernon Huxley, MD;  Location: [place] [facility];  Service:  Cardiovascular;  Laterality: N/A;    Social History   Socioeconomic History   Marital status: Widowed    Spouse name: Not on file   Number of children: 2   Years of education: 72   Highest education level: 12th grade  Occupational History    Comment: insurance agency  Tobacco Use   Smoking status: Never   Smokeless tobacco: Never  Vaping Use   Vaping Use: Never used  Substance and Sexual Activity   Alcohol use: No    Alcohol/week: 0.0 standard drinks of alcohol   Drug use: Never   Sexual activity: Not Currently  Other Topics Concern   Not on file  Social History Narrative   Not on file   Social Determinants of Health   Financial Resource Strain: Low Risk  (03/09/2021)   Overall Financial Resource Strain (CARDIA)    Difficulty of Paying Living Expenses: Not hard at all  Food Insecurity: No Food Insecurity (11/15/2021)   Hunger Vital Sign    Worried About Running Out of Food in the Last Year: Never true    [place] in the Last Year: Never true  Transportation Needs: Unmet Transportation Needs (11/30/2021)   PRAPARE - Transportation    Lack of Transportation (Medical): Yes    Lack of Transportation (Non-Medical): Yes  Physical Activity: Inactive (03/09/2021)   Exercise Vital Sign    Days of Exercise per Week: 0 days    Minutes of Exercise per Session: 0 min  Stress: No Stress Concern  Present (03/09/2021)   [place]    Feeling of Stress : Only a little  Social Connections: Socially Isolated (11/15/2021)   Social Connection and Isolation Panel [NHANES]    Frequency of Communication with Friends and Family: Three times a week    Frequency of Social Gatherings with Friends and Family: Twice a week    Attends Religious Services: Never    Marine scientist or Organizations: No    Attends Archivist Meetings: Never    Marital Status: Widowed  Intimate Partner Violence: Not At Risk (11/15/2021)   Humiliation, Afraid, Rape, and Kick questionnaire    Fear of Current or Ex-Partner: No    Emotionally Abused: No    Physically Abused: No    Sexually Abused: No    Immunization History  Administered Date(s) Administered   Fluad Quad(high Dose 65+) 05/31/2020, 05/14/2021   Influenza Split 07/13/2009, 07/03/2012   Influenza, High Dose Seasonal PF 05/13/2016, 06/11/2017, 06/05/2018, 06/07/2019   Influenza,inj,Quad PF,6+ Mos 05/25/2013, 05/31/2014, 04/27/2015   PFIZER(Purple Top)SARS-COV-2 Vaccination 12/16/2019, 01/14/2020   Pneumococcal Conjugate-13 11/29/2014   Pneumococcal-Unspecified 06/22/2001   Tdap 09/17/2017   Zoster Recombinat (Shingrix) 06/07/2019, 08/20/2019    Family History  Problem Relation Age of Onset   Heart disease Mother    Stroke Mother    Hypertension Mother    Heart disease Father        myocardial infarction age 55   Breast cancer Neg Hx      Current Outpatient Medications:    predniSONE (DELTASONE) 10 MG tablet, Take 1 tablet (10 mg total) by mouth daily with breakfast for 7 days., Disp: 7 tablet, Rfl: 0   albuterol (VENTOLIN HFA) 108 (90 Base) MCG/ACT inhaler, Inhale 2 puffs into the lungs every 6 (six) hours as needed for wheezing or shortness of breath., Disp: 18 g, Rfl: 0   aluminum-magnesium hydroxide-simethicone (MAALOX) 287-681-15 MG/5ML SUSP, Take 15 mLs by  mouth 4 (four) times daily -  before meals and at bedtime. , Disp: , Rfl:    amLODipine (NORVASC) 10 MG tablet, TAKE 1 TABLET BY MOUTH DAILY, Disp: 90 tablet, Rfl: 1   aspirin 81 MG EC tablet, Take by mouth., Disp: , Rfl:    atorvastatin (LIPITOR) 20 MG tablet, TAKE 1 TABLET BY MOUTH AT BEDTIME, Disp: 90 tablet, Rfl: 1   Azelastine HCl 137 MCG/SPRAY SOLN, PLACE 1 SPRAY INTO BOTH NOSTRILS TWO TIMES DAILY, Disp: 30 mL, Rfl: 1   benazepril (LOTENSIN) 40 MG tablet, TAKE 1 TABLET BY MOUTH DAILY, Disp: 90 tablet, Rfl: 1   betamethasone dipropionate 0.05 % cream, Apply 1 application topically 2 (two) times daily., Disp: , Rfl:    busPIRone (BUSPAR) 10 MG tablet, Take 10 mg by mouth 2 (two) times daily. , Disp: , Rfl:    calcium citrate-vitamin D (CITRACAL+D) 315-200 MG-UNIT tablet, Take 1 tablet by mouth daily., Disp: , Rfl:    Cholecalciferol (VITAMIN D3) 1000 UNITS CAPS, Take 1 capsule by mouth daily., Disp: , Rfl:    clobetasol cream (TEMOVATE) 8.92 %, Apply 1 application topically See admin instructions. Apply to affected areas of body 1 - 2 times daily as needed for itchy bumps. Avoid applying to face, groin, and axilla. Use as directed., Disp: 60 g, Rfl: 1   clobetasol cream (TEMOVATE) 0.05 %, Apply twice daily to affected areas up to 2 weeks as needed for rash/itching. Avoid applying to face, groin, and axilla., Disp: 60 g, Rfl: 2   Diclofenac Sodium 3 % GEL, Apply topically 2 (two) times daily., Disp: , Rfl:    Docusate Sodium (DSS) 100 MG CAPS, Take by mouth., Disp: , Rfl:    DULoxetine (CYMBALTA) 60 MG capsule, Take 60 mg by mouth daily., Disp: , Rfl:    ELMIRON 100 MG capsule, Take 100 mg by mouth 3 (three) times daily., Disp: , Rfl:    Fluocinolone Acetonide 0.01 % OIL, Apply 1-2 drops into ears once to twice daily as needed., Disp: 20 mL, Rfl: 3   fluocinonide (LIDEX) 0.05 % external solution, Apply topically 2 (two) times daily. Apply drops to scalp for itch, Disp: 60 mL, Rfl: 3   furosemide  (LASIX) 20 MG tablet, TAKE 1 TABLET BY MOUTH DAILY AS NEEDED, Disp: 90 tablet, Rfl: 0   gabapentin (NEURONTIN) 300 MG capsule, Take two tablets tid (in am, midday and q hs)., Disp: 180 capsule, Rfl: 2   HYDROcodone-acetaminophen (NORCO) 10-325 MG tablet, Take 1 tablet by mouth 3 (three) times daily., Disp: 10 tablet, Rfl: 0   ketoconazole (NIZORAL) 2 % shampoo, Apply 1 application topically See admin instructions. apply three times per week, massage into scalp and leave in for 10 minutes before rinsing out, Disp: 120 mL, Rfl: 3   LINZESS 145 MCG CAPS capsule, Take 145 mcg by mouth daily., Disp: , Rfl:    magnesium oxide (MAG-OX) 400 MG tablet, Take 1 tablet (400 mg total) by mouth daily., Disp: 30 tablet, Rfl: 1   Melatonin 10 MG CAPS, Take 1 capsule by mouth., Disp: , Rfl:    meloxicam (MOBIC) 15 MG tablet, Take 15 mg by mouth daily., Disp: , Rfl:    methocarbamol (ROBAXIN) 500 MG tablet, Take 500 mg by mouth 3 (three) times daily. As needed, Disp: , Rfl:    metoprolol succinate (TOPROL-XL) 50 MG 24 hr tablet, TAKE 1 TABLET BY MOUTH DAILY. TAKE WITH OR IMMEDIATELY FOLLOWING A MEAL., Disp: 90 tablet, Rfl: 1  montelukast (SINGULAIR) 10 MG tablet, 10 mg once daily as needed, Disp: , Rfl:    morphine (MS CONTIN) 60 MG 12 hr tablet, Take 1 tablet (60 mg total) by mouth 2 (two) times daily., Disp: 10 tablet, Rfl: 0   mupirocin ointment (BACTROBAN) 2 %, Apply 1 application topically 2 (two) times daily., Disp: 22 g, Rfl: 0   mupirocin ointment (BACTROBAN) 2 %, Apply 1 application topically daily. Apply to left knee until healed, Disp: 22 g, Rfl: 0   Naldemedine Tosylate 0.2 MG TABS, Take 0.2 mg by mouth daily., Disp: , Rfl:    nystatin (MYCOSTATIN/NYSTOP) powder, Apply 1 application. topically 2 (two) times daily as needed., Disp: 60 g, Rfl: 0   polyethylene glycol powder (GLYCOLAX/MIRALAX) 17 GM/SCOOP powder, MIX 17 GRAMS AS MARKED ON BOTTLE TOP IN 8 OUNCES OF WATER AND DRINK ONCE A DAY AS DIRECTED.,  Disp: 527 g, Rfl: 0   RABEprazole (ACIPHEX) 20 MG tablet, TAKE 1 TABLET BY MOUTH TWICE A DAY BEFORE A MEAL, Disp: 180 tablet, Rfl: 1   senna (SENOKOT) 8.6 MG TABS tablet, Take 1 tablet by mouth 2 (two) times daily., Disp: , Rfl:    Simethicone (GAS-X PO), Take 2 tablets by mouth 2 (two) times daily., Disp: , Rfl:    sodium chloride (OCEAN) 0.65 % nasal spray, Place 1 spray into the nose as needed., Disp: , Rfl:    SYNTHROID 100 MCG tablet, Take 1 tablet (100 mcg total) by mouth daily., Disp: 90 tablet, Rfl: 3   triamcinolone ointment (KENALOG) 0.1 %, APPLY TWICE DAILY TO BITES AND RASH UNTIL FLAT AND SMOOTH **DO NOT APPLY TO FACE**, Disp: 80 g, Rfl: 0  Physical exam: Exam limited due to telemedicine Physical Exam Constitutional:      General: She is not in acute distress.    Appearance: Normal appearance. She is not ill-appearing.  HENT:     Head: Normocephalic.  Skin:    General: Skin is warm.     Findings: Rash: No visible rash seen from video camera.  Neurological:     Mental Status: She is alert.      Assessment and plan- Patient is a 88 y.o. female    Visit Diagnosis 1. Pruritic rash    New. Discussed that the rash she is describing could be secondary to both the meloxicam or the Venofer. Easy to switch IV iron. Holding next weeks dose. Having her continue her cream and adding on very low dose prednisone which she has done well with in the past. Reviewed how to use. Daughter is pharmacist- discussed plan with her as well with patient's permission. Daughter also had a question about infusion rate- linked with Lorrine Kin. Follow up next week in office to ensure rash is improving. Red flag signs and symptoms discussed.    Patient expressed understanding and was in agreement with this plan. She also understands that She can call clinic at any time with any questions, concerns, or complaints.   I discussed the assessment and treatment plan with the patient. The patient was  provided an opportunity to ask questions and all were answered. The patient agreed with the plan and demonstrated an understanding of the instructions.   The patient was advised to call back or seek an in-person evaluation if the symptoms worsen or if the condition fails to improve as anticipated.   I spent 15 minutes face-to-face video visit time dedicated to the care of this patient on the date of this encounter to  include pre-visit review of Dermatology/PCP note as well as previous infusion details, face-to-face time with the patient, and post visit ordering of testing/documentation.    Thank you for allowing me to participate in the care of this very pleasant patient.   [facility]  CC:

## 2022-03-13 NOTE — Telephone Encounter (Signed)
Per Merrily Pew- Please see if Clarise Cruz, [place] can see patient via mychart

## 2022-03-13 NOTE — Telephone Encounter (Signed)
Pt called in stating that she believes she is having a reaction to venofer she received yesterday

## 2022-03-13 NOTE — Telephone Encounter (Signed)
Patient called reporting that she got her second iron infusion this week (yesterday) and she is now broke out in little red bumps that itches like crazy She states that the rash started after her first infusion, and now it is much worse after second infusion.  She did have a dermatology appointment yesterday and she was given a cream for the itchy rash, but did not think to correlate it to be associated with the iron until she spoke with her daughter later in the day.. She has another infusion scheduled for next week and is asking something to be done  Please advise She is requesting Dr B call her daughter [phone]

## 2022-03-14 ENCOUNTER — Telehealth: Payer: Self-pay | Admitting: Internal Medicine

## 2022-03-14 ENCOUNTER — Telehealth: Payer: Self-pay

## 2022-03-14 NOTE — Telephone Encounter (Signed)
I spoke to patient's daughter re: recent skin rash from venofer vs. Meloxicam [X allergy with sulfa] s/p evaluation with APP. Agree with Holding venofer next week.   Discussed with Judson Roch.   Follow up as planned with me as planned- GB

## 2022-03-14 NOTE — Telephone Encounter (Signed)
-----  Message from [place], MD sent at 03/14/2022  1:55 PM EDT ----- Skin , left upper back PERIVASCULAR DERMATITIS WITH EOSINOPHILS, SEE DESCRIPTION --> suggestive of drug rash  Recommend review of any medications that were new in the last 3 months before the rash appeared and consider whether we can stop them or switch to an alternative. Will discuss further at her suture removal appt. If she is not doing well with regards to her itch, we could consider oral prednisone but this has side effects so if we can control this with the topical cream, that is the preference.   MAs please call. Thank you!

## 2022-03-14 NOTE — Telephone Encounter (Signed)
Discussed pathology results with patient. She will bring updated medication list with her to appointment for suture removal. Will try to have specifics on any newer medications started ~3 months prior to rash. Patient voiced understanding. 2:30 pm. JP

## 2022-03-15 DIAGNOSIS — Z96651 Presence of right artificial knee joint: Secondary | ICD-10-CM | POA: Diagnosis not present

## 2022-03-15 DIAGNOSIS — M25562 Pain in left knee: Secondary | ICD-10-CM | POA: Diagnosis not present

## 2022-03-15 DIAGNOSIS — M25561 Pain in right knee: Secondary | ICD-10-CM | POA: Diagnosis not present

## 2022-03-18 ENCOUNTER — Ambulatory Visit (INDEPENDENT_AMBULATORY_CARE_PROVIDER_SITE_OTHER): Payer: Medicare HMO | Admitting: *Deleted

## 2022-03-18 DIAGNOSIS — I10 Essential (primary) hypertension: Secondary | ICD-10-CM

## 2022-03-18 DIAGNOSIS — G8929 Other chronic pain: Secondary | ICD-10-CM

## 2022-03-18 NOTE — Patient Instructions (Signed)
CONGRATULATIONS ON COMPLETING YOUR GOALS.  IT AS BEEN A PLEASURE WORKING WITH AND TALKING TO YOU.  IF  NEEDS [facility] IN THE FUTURE PLEASE DO NOT HESITATE TO CONTACT ME  [phone]  Hubert Azure RN, MSN RN Care Management Coordinator [place] [phone] [email or web address]

## 2022-03-18 NOTE — Chronic Care Management (AMB) (Signed)
  Care Management   Follow Up Note   03/18/2022 Name: Kimberly Roy MRN: [id number] DOB: March 22, 1931   Referred by: Einar Pheasant, MD Reason for referral : Chronic Care Management   Successful outreach to patient.  States she is doing well without complaints.  Discussed goals and both agree patient has met goals of the program.  Follow Up Plan: The patient has been provided with contact information for the care management team and has been advised to call with any health-related questions or concerns.  No further follow up required: as personal goals have been met.  Hubert Azure RN, MSN RN Care Management Coordinator [place] [phone] [email or web address]

## 2022-03-19 ENCOUNTER — Ambulatory Visit: Payer: Medicare HMO

## 2022-03-19 ENCOUNTER — Ambulatory Visit
Admission: RE | Admit: 2022-03-19 | Discharge: 2022-03-19 | Disposition: A | Discharge status: Home / Self Care | Payer: Medicare HMO | Source: Ambulatory Visit | Attending: Internal Medicine | Admitting: Internal Medicine

## 2022-03-19 ENCOUNTER — Inpatient Hospital Stay: Payer: Medicare HMO | Admitting: Medical Oncology

## 2022-03-19 ENCOUNTER — Telehealth: Payer: Self-pay | Admitting: Internal Medicine

## 2022-03-19 VITALS — BP 126/42 | HR 58 | Temp 98.4°F | Resp 16

## 2022-03-19 DIAGNOSIS — I110 Hypertensive heart disease with heart failure: Secondary | ICD-10-CM | POA: Diagnosis not present

## 2022-03-19 DIAGNOSIS — Z1231 Encounter for screening mammogram for malignant neoplasm of breast: Secondary | ICD-10-CM | POA: Diagnosis not present

## 2022-03-19 DIAGNOSIS — E039 Hypothyroidism, unspecified: Secondary | ICD-10-CM | POA: Diagnosis not present

## 2022-03-19 DIAGNOSIS — I272 Pulmonary hypertension, unspecified: Secondary | ICD-10-CM | POA: Diagnosis not present

## 2022-03-19 DIAGNOSIS — R21 Rash and other nonspecific skin eruption: Secondary | ICD-10-CM | POA: Diagnosis not present

## 2022-03-19 DIAGNOSIS — M1990 Unspecified osteoarthritis, unspecified site: Secondary | ICD-10-CM | POA: Diagnosis not present

## 2022-03-19 DIAGNOSIS — I509 Heart failure, unspecified: Secondary | ICD-10-CM | POA: Diagnosis not present

## 2022-03-19 DIAGNOSIS — Z7952 Long term (current) use of systemic steroids: Secondary | ICD-10-CM | POA: Diagnosis not present

## 2022-03-19 DIAGNOSIS — M7122 Synovial cyst of popliteal space [Baker], left knee: Secondary | ICD-10-CM | POA: Diagnosis not present

## 2022-03-19 DIAGNOSIS — L299 Pruritus, unspecified: Secondary | ICD-10-CM | POA: Diagnosis not present

## 2022-03-19 DIAGNOSIS — L282 Other prurigo: Secondary | ICD-10-CM

## 2022-03-19 MED ORDER — PREDNISONE 20 MG PO TABS: 20.0000 mg | ORAL_TABLET | Freq: Every day | ORAL | 0 refills | Status: DC

## 2022-03-19 MED FILL — Iron Sucrose Inj 20 MG/ML (Fe Equiv): INTRAVENOUS | Qty: 10 | Status: AC

## 2022-03-19 NOTE — Progress Notes (Signed)
Symptom Management [place] at [facility] Telephone:(336) [phone] Fax:(336) [phone]  Patient Care Team: Einar Pheasant, [name], [name], [name], [name], MD as Consulting Physician (Hematology and Oncology)   Name of the patient: Kimberly Roy  [id number]  November 13, 1930   Date of visit: 03/19/22  Reason for Consult: Kimberly Roy is a 88 y.o. female who presents today for:  Rash: Patient has had a rash of her body since her last iron treatment. Initially this was suspected to be due to meloxicam which was recently started. Once she thought about it more she thinks she had a similar rash after her previous iron infusion- which is why she does not believe it is from the meloxicam. She was seen by her dermatologist- biopsy showed drug rash. Meloxicam stopped. Last new bump occurred yesterday. Using topical steroids with some success. At our last visit on 03/13/2022 I started her on low dose prednisone to help with itching. This has helped some. She has tolerated this well.   Denies any neurologic complaints. Denies recent fevers or illnesses. Denies any easy bleeding or bruising. Reports good appetite and denies weight loss. Denies chest pain. Denies any nausea, vomiting, constipation, or diarrhea. Denies urinary complaints. Patient offers no further specific complaints today.    PAST MEDICAL HISTORY: Past Medical History:  Diagnosis Date   Anemia    Anxiety    Chest pain    CHF (congestive heart failure) (HCC)    Constipation    DDD (degenerative disc disease), cervical    Depression    DVT (deep venous thrombosis) (HCC)    Dysphonia    Dyspnea    Fatty liver    Fatty liver    Headache    Hyperlipidemia    Hyperpiesia    Hypertension    Hypothyroidism    Interstitial cystitis    Left  ventricular dysfunction    Lymphedema    Nephrolithiasis    Obstructive sleep apnea    Osteoarthritis    knees/cervical and lumbar spine   Pulmonary hypertension ([place])    Pulmonary nodules    followed by Dr Raul Del   Pure hypercholesterolemia    Renal cyst    right    PAST SURGICAL HISTORY:  Past Surgical History:  Procedure Laterality Date   ABDOMINAL HYSTERECTOMY     ovaries left in place   APPENDECTOMY     Back Surgeries     BACK SURGERY     BREAST REDUCTION SURGERY     3/99   CARDIAC CATHETERIZATION     cataracts Bilateral    CERVICAL SPINE SURGERY     ESOPHAGEAL MANOMETRY N/A 08/02/2015   Procedure: ESOPHAGEAL MANOMETRY (EM);  Surgeon: Josefine Class, MD;  Location: [facility] ENDOSCOPY;  Service: Endoscopy;  Laterality: N/A;   ESOPHAGOGASTRODUODENOSCOPY N/A 02/27/2015   Procedure: ESOPHAGOGASTRODUODENOSCOPY (EGD);  Surgeon: Hulen Luster, MD;  Location: [facility] ENDOSCOPY;  Service: Gastroenterology;  Laterality: N/A;   ESOPHAGOGASTRODUODENOSCOPY (EGD) WITH PROPOFOL N/A 10/30/2018   Procedure: ESOPHAGOGASTRODUODENOSCOPY (EGD) WITH PROPOFOL;  Surgeon: Lollie Sails, MD;  Location: [facility] ENDOSCOPY;  Service: Endoscopy;  Laterality: N/A;   EXCISIONAL HEMORRHOIDECTOMY     EYE SURGERY     FRACTURE SURGERY     HEMORRHOID SURGERY     HIP SURGERY  2013   Right hip surgery   JOINT REPLACEMENT     KNEE ARTHROSCOPY  left and right   ORIF FEMUR FRACTURE Right 08/07/2018   Procedure: OPEN REDUCTION INTERNAL FIXATION (ORIF) DISTAL FEMUR FRACTURE;  Surgeon: Hessie Knows, MD;  Location: [facility] ORS;  Service: Orthopedics;  Laterality: Right;   REDUCTION MAMMAPLASTY Bilateral YRS AGO   REPLACEMENT TOTAL KNEE Bilateral    rotator cuff surgery     blilateral   TONSILECTOMY/ADENOIDECTOMY WITH MYRINGOTOMY     VISCERAL ARTERY INTERVENTION N/A 02/25/2019   Procedure: VISCERAL ARTERY INTERVENTION;  Surgeon: Algernon Huxley, MD;  Location: [place] LAB;  Service: Cardiovascular;   Laterality: N/A;    HEMATOLOGY/ONCOLOGY HISTORY:  Oncology History   No history exists.    ALLERGIES:  is allergic to lyrica [pregabalin], omnicef [cefdinir], atarax [hydroxyzine], dicyclomine, hydroxyzine hcl, levaquin [levofloxacin], nitrofurantoin, nucynta er [tapentadol hcl er], oxybutynin, zoloft [sertraline hcl], biaxin [clarithromycin], sertraline, sulfa antibiotics, sulfasalazine, tape, tapentadol, and venofer [iron sucrose].  MEDICATIONS:  Current Outpatient Medications  Medication Sig Dispense Refill   predniSONE (DELTASONE) 20 MG tablet Take 1 tablet (20 mg total) by mouth daily with breakfast. 5 tablet 0   albuterol (VENTOLIN HFA) 108 (90 Base) MCG/ACT inhaler Inhale 2 puffs into the lungs every 6 (six) hours as needed for wheezing or shortness of breath. 18 g 0   aluminum-magnesium hydroxide-simethicone (MAALOX) 935-701-77 MG/5ML SUSP Take 15 mLs by mouth 4 (four) times daily -  before meals and at bedtime.      amLODipine (NORVASC) 10 MG tablet TAKE 1 TABLET BY MOUTH DAILY 90 tablet 1   aspirin 81 MG EC tablet Take by mouth.     atorvastatin (LIPITOR) 20 MG tablet TAKE 1 TABLET BY MOUTH AT BEDTIME 90 tablet 1   Azelastine HCl 137 MCG/SPRAY SOLN PLACE 1 SPRAY INTO BOTH NOSTRILS TWO TIMES DAILY 30 mL 1   benazepril (LOTENSIN) 40 MG tablet TAKE 1 TABLET BY MOUTH DAILY 90 tablet 1   betamethasone dipropionate 0.05 % cream Apply 1 application topically 2 (two) times daily.     busPIRone (BUSPAR) 10 MG tablet Take 10 mg by mouth 2 (two) times daily.      calcium citrate-vitamin D (CITRACAL+D) 315-200 MG-UNIT tablet Take 1 tablet by mouth daily.     Cholecalciferol (VITAMIN D3) 1000 UNITS CAPS Take 1 capsule by mouth daily.     clobetasol cream (TEMOVATE) 9.39 % Apply 1 application topically See admin instructions. Apply to affected areas of body 1 - 2 times daily as needed for itchy bumps. Avoid applying to face, groin, and axilla. Use as directed. 60 g 1   clobetasol cream  (TEMOVATE) 0.05 % Apply twice daily to affected areas up to 2 weeks as needed for rash/itching. Avoid applying to face, groin, and axilla. 60 g 2   Diclofenac Sodium 3 % GEL Apply topically 2 (two) times daily.     Docusate Sodium (DSS) 100 MG CAPS Take by mouth.     DULoxetine (CYMBALTA) 60 MG capsule Take 60 mg by mouth daily.     ELMIRON 100 MG capsule Take 100 mg by mouth 3 (three) times daily.     Fluocinolone Acetonide 0.01 % OIL Apply 1-2 drops into ears once to twice daily as needed. 20 mL 3   fluocinonide (LIDEX) 0.05 % external solution Apply topically 2 (two) times daily. Apply drops to scalp for itch 60 mL 3   furosemide (LASIX) 20 MG tablet TAKE 1 TABLET BY MOUTH DAILY AS NEEDED 90 tablet 0   gabapentin (NEURONTIN) 300 MG capsule Take two tablets tid (  in am, midday and q hs). 180 capsule 2   HYDROcodone-acetaminophen (NORCO) 10-325 MG tablet Take 1 tablet by mouth 3 (three) times daily. 10 tablet 0   ketoconazole (NIZORAL) 2 % shampoo Apply 1 application topically See admin instructions. apply three times per week, massage into scalp and leave in for 10 minutes before rinsing out 120 mL 3   LINZESS 145 MCG CAPS capsule Take 145 mcg by mouth daily.     magnesium oxide (MAG-OX) 400 MG tablet Take 1 tablet (400 mg total) by mouth daily. 30 tablet 1   Melatonin 10 MG CAPS Take 1 capsule by mouth.     meloxicam (MOBIC) 15 MG tablet Take 15 mg by mouth daily.     methocarbamol (ROBAXIN) 500 MG tablet Take 500 mg by mouth 3 (three) times daily. As needed     metoprolol succinate (TOPROL-XL) 50 MG 24 hr tablet TAKE 1 TABLET BY MOUTH DAILY. TAKE WITH OR IMMEDIATELY FOLLOWING A MEAL. 90 tablet 1   montelukast (SINGULAIR) 10 MG tablet 10 mg once daily as needed     morphine (MS CONTIN) 60 MG 12 hr tablet Take 1 tablet (60 mg total) by mouth 2 (two) times daily. 10 tablet 0   mupirocin ointment (BACTROBAN) 2 % Apply 1 application topically 2 (two) times daily. 22 g 0   mupirocin ointment  (BACTROBAN) 2 % Apply 1 application topically daily. Apply to left knee until healed 22 g 0   Naldemedine Tosylate 0.2 MG TABS Take 0.2 mg by mouth daily.     nystatin (MYCOSTATIN/NYSTOP) powder Apply 1 application. topically 2 (two) times daily as needed. 60 g 0   polyethylene glycol powder (GLYCOLAX/MIRALAX) 17 GM/SCOOP powder MIX 17 GRAMS AS MARKED ON BOTTLE TOP IN 8 OUNCES OF WATER AND DRINK ONCE A DAY AS DIRECTED. 527 g 0   RABEprazole (ACIPHEX) 20 MG tablet TAKE 1 TABLET BY MOUTH TWICE A DAY BEFORE A MEAL 180 tablet 1   senna (SENOKOT) 8.6 MG TABS tablet Take 1 tablet by mouth 2 (two) times daily.     Simethicone (GAS-X PO) Take 2 tablets by mouth 2 (two) times daily.     sodium chloride (OCEAN) 0.65 % nasal spray Place 1 spray into the nose as needed.     SYNTHROID 100 MCG tablet Take 1 tablet (100 mcg total) by mouth daily. 90 tablet 3   triamcinolone ointment (KENALOG) 0.1 % APPLY TWICE DAILY TO BITES AND RASH UNTIL FLAT AND SMOOTH **DO NOT APPLY TO FACE** 80 g 0   No current facility-administered medications for this visit.    VITAL SIGNS: BP (!) 126/42   Pulse (!) 58   Temp 98.4 F (36.9 C) (Oral)   Resp 16   SpO2 99%  There were no vitals filed for this visit.  Estimated body mass index is 32.21 kg/m as calculated from the following:   Height as of 02/07/22: _0  (1.575 m).   Weight as of 02/07/22: 176 lb 2 oz (79.9 kg).  LABS: CBC:    Component Value Date/Time   WBC 6.1 02/28/2022 1116   HGB 10.0 (L) 02/28/2022 1116   HGB 10.4 (L) 10/29/2019 0000   HCT 31.4 (L) 02/28/2022 1116   HCT 30.8 (L) 10/29/2019 0000   PLT 113 (L) 02/28/2022 1116   PLT 284 10/29/2019 0000   MCV 104.0 (H) 02/28/2022 1116   MCV 95 10/29/2019 0000   MCV 94 04/18/2014 0944   NEUTROABS 4.2 02/28/2022 1116  NEUTROABS 11 11/07/2019 0000   NEUTROABS 3.2 04/18/2014 0944   LYMPHSABS 1.2 02/28/2022 1116   LYMPHSABS 2.0 10/29/2019 0000   LYMPHSABS 2.4 04/18/2014 0944   MONOABS 0.4 02/28/2022 1116    MONOABS 0.6 04/18/2014 0944   EOSABS 0.2 02/28/2022 1116   EOSABS 0.3 10/29/2019 0000   EOSABS 0.4 04/18/2014 0944   BASOSABS 0.0 02/28/2022 1116   BASOSABS 0.0 10/29/2019 0000   BASOSABS 0.0 04/18/2014 0944   Comprehensive Metabolic Panel:    Component Value Date/Time   NA 138 02/28/2022 1116   NA 140 10/29/2019 0000   NA 139 12/24/2013 0529   K 3.9 02/28/2022 1116   K 4.3 12/24/2013 0529   CL 98 02/28/2022 1116   CL 102 12/24/2013 0529   CO2 31 02/28/2022 1116   CO2 33 (H) 12/24/2013 0529   BUN 20 02/28/2022 1116   BUN 14 10/29/2019 0000   BUN 17 12/24/2013 0529   CREATININE 1.00 02/28/2022 1116   CREATININE 0.89 03/18/2014 1044   GLUCOSE 124 (H) 02/28/2022 1116   GLUCOSE 109 (H) 12/24/2013 0529   CALCIUM 9.0 02/28/2022 1116   CALCIUM 8.1 (L) 12/24/2013 0529   AST 15 01/21/2022 1200   AST 20 03/18/2014 1044   ALT 13 01/21/2022 1200   ALT 20 03/18/2014 1044   ALKPHOS 96 01/21/2022 1200   ALKPHOS 94 03/18/2014 1044   BILITOT 0.4 01/21/2022 1200   BILITOT <0.2 10/29/2019 0000   BILITOT 0.3 03/18/2014 1044   PROT 6.4 01/21/2022 1200   PROT 6.0 10/29/2019 0000   PROT 7.5 03/18/2014 1044   ALBUMIN 4.3 01/21/2022 1200   ALBUMIN 3.3 (L) 10/29/2019 0000   ALBUMIN 3.5 03/18/2014 1044    RADIOGRAPHIC STUDIES: [place] LT LOWER EXTREM LTD SOFT TISSUE NON VASCULAR  Result Date: 03/07/2022 CLINICAL DATA:  Evaluate synovial cyst of the left knee. EXAM: ULTRASOUND LEFT LOWER EXTREMITY LIMITED TECHNIQUE: Ultrasound examination of the lower extremity soft tissues was performed in the area of clinical concern. COMPARISON:  None Available. FINDINGS: Joint Space: No effusion. Muscles: Normal. Tendons: Normal Other Soft Tissue Structures: Normal. IMPRESSION: Unremarkable limited ultrasound of the left lower extremity. Electronically Signed   By: Virgina Norfolk M.D.   On: 03/07/2022 21:53    PERFORMANCE STATUS (ECOG) : 0 - Asymptomatic  Review of Systems Unless otherwise noted, a  complete review of systems is negative.  Physical Exam General: NAD Cardiovascular: regular rate and rhythm Pulmonary: clear ant fields Abdomen: soft, nontender, + bowel sounds GU: no suprapubic tenderness Extremities: no edema, no joint deformities Skin: See below Neurological: Weakness but otherwise nonfocal     Assessment and Plan- Patient is a 88 y.o. female    Encounter Diagnosis  Name Primary?   Pruritic rash Yes    Given that this rash is still occurring after stopping the meloxicam for about 2 weeks this rash does appear to be secondary to her Venofer iron infusion. She would like to increase and extend the prednisone slightly to help as the Venofer effects resolve with time. She will also start pepcid. Venofer to be stopped and she will be switch to an alternative. She will follow up in 2 weeks for labs and consideration of Monoferric.    Patient expressed understanding and was in agreement with this plan. She also understands that She can call clinic at any time with any questions, concerns, or complaints.   Thank you for allowing me to participate in the care of this very pleasant patient.  Time Total: 25  Visit consisted of counseling and education dealing with the complex and emotionally intense issues of symptom management in the setting of serious illness.Greater than 50%  of this time was spent counseling and coordinating care related to the above assessment and plan.  Signed by: Nelwyn Salisbury, PA-C

## 2022-03-19 NOTE — Progress Notes (Signed)
Pt returns for follow-up of rash. She reports that rash is a little better, but still persists. Pt stated that her dermatologist biopsied the rash and informed of the results as a possible drug reaction.

## 2022-03-19 NOTE — Telephone Encounter (Signed)
Per chat- sorry- 2 weeks labs and next day with provider-  Appointments scheduled

## 2022-03-20 ENCOUNTER — Telehealth: Payer: Self-pay | Admitting: Internal Medicine

## 2022-03-20 ENCOUNTER — Telehealth: Payer: Self-pay

## 2022-03-20 ENCOUNTER — Encounter: Payer: Self-pay | Admitting: Dermatology

## 2022-03-20 MED ORDER — IPRATROPIUM BROMIDE 0.03 % NA SOLN: 2.0000 | Freq: Two times a day (BID) | NASAL | 1 refills | Status: DC

## 2022-03-20 NOTE — Telephone Encounter (Signed)
Patient is calling about questions related to her oxygen.  Per patient this is her 2nd request.

## 2022-03-20 NOTE — Telephone Encounter (Signed)
error 

## 2022-03-20 NOTE — Telephone Encounter (Signed)
Kimberly Roy from [facility], is requesting any face to face to notes or documents related to the portable oxygen concentrator order for the patient.  Documents can faxed to [phone].

## 2022-03-20 NOTE — Telephone Encounter (Signed)
Most recent office note faxed to [phone]  S/w [place] - advised of status - records faxed, working on order.

## 2022-03-20 NOTE — Telephone Encounter (Signed)
I have sent in the atrovent nasal spray as she requested.  Needs to stop astelin nasal srpay.  Regarding the diclofenac gel, she has a listed allergy to sulfsalazine.  There is a listed allergy interaction.  Can hold on gel given possible allergy interaction.

## 2022-03-20 NOTE — Telephone Encounter (Signed)
Pt asking for rx Ipratropium Nasal spray to be sent to local pharm. States using the Azelastine, but nose still runs and is bothersome. Pt states her daughter uses Ipratropium, and it works well for her so pt would like to give it a try.  Pt also asking for rx for Diclofenac 1% topical gel to be sent to local pharm. States uses otc voltaren for shoulder pain, but found out if she can get it sent in as a 1% prescription that it is cheaper for her.  Please advise if ok to send.

## 2022-03-22 DIAGNOSIS — M6289 Other specified disorders of muscle: Secondary | ICD-10-CM | POA: Diagnosis not present

## 2022-03-22 DIAGNOSIS — G4733 Obstructive sleep apnea (adult) (pediatric): Secondary | ICD-10-CM | POA: Diagnosis not present

## 2022-03-22 DIAGNOSIS — R102 Pelvic and perineal pain: Secondary | ICD-10-CM | POA: Diagnosis not present

## 2022-03-22 DIAGNOSIS — N281 Cyst of kidney, acquired: Secondary | ICD-10-CM | POA: Diagnosis not present

## 2022-03-22 DIAGNOSIS — N952 Postmenopausal atrophic vaginitis: Secondary | ICD-10-CM | POA: Diagnosis not present

## 2022-03-22 DIAGNOSIS — R350 Frequency of micturition: Secondary | ICD-10-CM | POA: Diagnosis not present

## 2022-03-22 DIAGNOSIS — Z87440 Personal history of urinary (tract) infections: Secondary | ICD-10-CM | POA: Diagnosis not present

## 2022-03-22 DIAGNOSIS — R32 Unspecified urinary incontinence: Secondary | ICD-10-CM | POA: Diagnosis not present

## 2022-03-22 DIAGNOSIS — N3010 Interstitial cystitis (chronic) without hematuria: Secondary | ICD-10-CM | POA: Diagnosis not present

## 2022-03-27 ENCOUNTER — Encounter: Payer: Self-pay | Admitting: Internal Medicine

## 2022-03-27 ENCOUNTER — Ambulatory Visit (INDEPENDENT_AMBULATORY_CARE_PROVIDER_SITE_OTHER): Payer: Medicare HMO | Admitting: Internal Medicine

## 2022-03-27 DIAGNOSIS — I509 Heart failure, unspecified: Secondary | ICD-10-CM | POA: Diagnosis not present

## 2022-03-27 DIAGNOSIS — G629 Polyneuropathy, unspecified: Secondary | ICD-10-CM | POA: Diagnosis not present

## 2022-03-27 DIAGNOSIS — N3010 Interstitial cystitis (chronic) without hematuria: Secondary | ICD-10-CM

## 2022-03-27 DIAGNOSIS — I700 Atherosclerosis of aorta: Secondary | ICD-10-CM

## 2022-03-27 DIAGNOSIS — S31809D Unspecified open wound of unspecified buttock, subsequent encounter: Secondary | ICD-10-CM

## 2022-03-27 DIAGNOSIS — E033 Postinfectious hypothyroidism: Secondary | ICD-10-CM

## 2022-03-27 DIAGNOSIS — M25562 Pain in left knee: Secondary | ICD-10-CM

## 2022-03-27 DIAGNOSIS — I272 Pulmonary hypertension, unspecified: Secondary | ICD-10-CM

## 2022-03-27 DIAGNOSIS — M25511 Pain in right shoulder: Secondary | ICD-10-CM

## 2022-03-27 DIAGNOSIS — K219 Gastro-esophageal reflux disease without esophagitis: Secondary | ICD-10-CM

## 2022-03-27 DIAGNOSIS — I872 Venous insufficiency (chronic) (peripheral): Secondary | ICD-10-CM

## 2022-03-27 DIAGNOSIS — I10 Essential (primary) hypertension: Secondary | ICD-10-CM

## 2022-03-27 DIAGNOSIS — J9611 Chronic respiratory failure with hypoxia: Secondary | ICD-10-CM | POA: Diagnosis not present

## 2022-03-27 DIAGNOSIS — E785 Hyperlipidemia, unspecified: Secondary | ICD-10-CM

## 2022-03-27 MED ORDER — DERMACLOUD EX OINT: TOPICAL_OINTMENT | CUTANEOUS | 1 refills | Status: DC

## 2022-03-27 NOTE — Progress Notes (Unsigned)
Patient ID: Kimberly Roy, female   DOB: 04-13-1931, 88 y.o.   MRN: [id number]   Subjective:    Patient ID: Kimberly Roy, female    DOB: 12-Aug-1931, 88 y.o.   MRN: [id number]    Patient here for  HPI    Past Medical History:  Diagnosis Date   Anemia    Anxiety    Chest pain    CHF (congestive heart failure) ([place])    Constipation    DDD (degenerative disc disease), cervical    Depression    DVT (deep venous thrombosis) (HCC)    Dysphonia    Dyspnea    Fatty liver    Fatty liver    Headache    Hyperlipidemia    Hyperpiesia    Hypertension    Hypothyroidism    Interstitial cystitis    Left ventricular dysfunction    Lymphedema    Nephrolithiasis    Obstructive sleep apnea    Osteoarthritis    knees/cervical and lumbar spine   Pulmonary hypertension (HCC)    Pulmonary nodules    followed by Dr Raul Del   Pure hypercholesterolemia    Renal cyst    right   Past Surgical History:  Procedure Laterality Date   ABDOMINAL HYSTERECTOMY     ovaries left in place   APPENDECTOMY     Back Surgeries     BACK SURGERY     BREAST REDUCTION SURGERY     3/99   CARDIAC CATHETERIZATION     cataracts Bilateral    CERVICAL SPINE SURGERY     ESOPHAGEAL MANOMETRY N/A 08/02/2015   Procedure: ESOPHAGEAL MANOMETRY (EM);  Surgeon: Josefine Class, MD;  Location: [facility] ENDOSCOPY;  Service: Endoscopy;  Laterality: N/A;   ESOPHAGOGASTRODUODENOSCOPY N/A 02/27/2015   Procedure: ESOPHAGOGASTRODUODENOSCOPY (EGD);  Surgeon: Hulen Luster, MD;  Location: [facility] ENDOSCOPY;  Service: Gastroenterology;  Laterality: N/A;   ESOPHAGOGASTRODUODENOSCOPY (EGD) WITH PROPOFOL N/A 10/30/2018   Procedure: ESOPHAGOGASTRODUODENOSCOPY (EGD) WITH PROPOFOL;  Surgeon: Lollie Sails, MD;  Location: [facility] ENDOSCOPY;  Service: Endoscopy;  Laterality: N/A;   EXCISIONAL HEMORRHOIDECTOMY     EYE SURGERY     FRACTURE SURGERY     HEMORRHOID SURGERY     HIP SURGERY  2013   Right hip surgery   JOINT REPLACEMENT     KNEE  ARTHROSCOPY     left and right   ORIF FEMUR FRACTURE Right 08/07/2018   Procedure: OPEN REDUCTION INTERNAL FIXATION (ORIF) DISTAL FEMUR FRACTURE;  Surgeon: Hessie Knows, MD;  Location: [facility] ORS;  Service: Orthopedics;  Laterality: Right;   REDUCTION MAMMAPLASTY Bilateral YRS AGO   REPLACEMENT TOTAL KNEE Bilateral    rotator cuff surgery     blilateral   TONSILECTOMY/ADENOIDECTOMY WITH MYRINGOTOMY     VISCERAL ARTERY INTERVENTION N/A 02/25/2019   Procedure: VISCERAL ARTERY INTERVENTION;  Surgeon: Algernon Huxley, MD;  Location: [place] [facility];  Service: Cardiovascular;  Laterality: N/A;   Family History  Problem Relation Age of Onset   Heart disease Mother    Stroke Mother    Hypertension Mother    Heart disease Father        myocardial infarction age 59   Breast cancer Neg Hx    Social History   Socioeconomic History   Marital status: Widowed    Spouse name: Not on file   Number of children: 2   Years of education: 53   Highest education level: 12th grade  Occupational History    Comment: insurance  agency  Tobacco Use   Smoking status: Never   Smokeless tobacco: Never  Vaping Use   Vaping Use: Never used  Substance and Sexual Activity   Alcohol use: No    Alcohol/week: 0.0 standard drinks of alcohol   Drug use: Never   Sexual activity: Not Currently  Other Topics Concern   Not on file  Social History Narrative   Not on file   Social Determinants of Health   Financial Resource Strain: Low Risk  (03/09/2021)   Overall Financial Resource Strain (CARDIA)    Difficulty of Paying Living Expenses: Not hard at all  Food Insecurity: No Food Insecurity (11/15/2021)   Hunger Vital Sign    Worried About Running Out of Food in the Last Year: Never true    Ran Out of Food in the Last Year: Never true  Transportation Needs: Unmet Transportation Needs (11/30/2021)   PRAPARE - Transportation    Lack of Transportation (Medical): Yes    Lack of Transportation (Non-Medical): Yes   Physical Activity: Inactive (03/09/2021)   Exercise Vital Sign    Days of Exercise per Week: 0 days    Minutes of Exercise per Session: 0 min  Stress: No Stress Concern Present (03/09/2021)   [place]    Feeling of Stress : Only a little  Social Connections: Socially Isolated (11/15/2021)   Social Connection and Isolation Panel [NHANES]    Frequency of Communication with Friends and Family: Three times a week    Frequency of Social Gatherings with Friends and Family: Twice a week    Attends Religious Services: Never    Marine scientist or Organizations: No    Attends Archivist Meetings: Never    Marital Status: Widowed     Review of Systems     Objective:     BP (!) 130/58 (BP Location: Left Arm, Patient Position: Sitting, Cuff Size: Large)   Pulse 60   Temp 98.3 F (36.8 C) (Temporal)   Resp 15   Ht _0  (1.575 m)   Wt 175 lb 4.8 oz (79.5 kg)   SpO2 97%   BMI 32.06 kg/m  Wt Readings from Last 3 Encounters:  03/27/22 175 lb 4.8 oz (79.5 kg)  02/07/22 176 lb 2 oz (79.9 kg)  01/29/22 176 lb (79.8 kg)    Physical Exam   Outpatient Encounter Medications as of 03/27/2022  Medication Sig   albuterol (VENTOLIN HFA) 108 (90 Base) MCG/ACT inhaler Inhale 2 puffs into the lungs every 6 (six) hours as needed for wheezing or shortness of breath.   aluminum-magnesium hydroxide-simethicone (MAALOX) 893-734-28 MG/5ML SUSP Take 15 mLs by mouth 4 (four) times daily -  before meals and at bedtime.    amLODipine (NORVASC) 10 MG tablet TAKE 1 TABLET BY MOUTH DAILY   aspirin 81 MG EC tablet Take by mouth.   atorvastatin (LIPITOR) 20 MG tablet TAKE 1 TABLET BY MOUTH AT BEDTIME   benazepril (LOTENSIN) 40 MG tablet TAKE 1 TABLET BY MOUTH DAILY   betamethasone dipropionate 0.05 % cream Apply 1 application topically 2 (two) times daily.   busPIRone (BUSPAR) 10 MG tablet Take 10 mg by mouth 2 (two) times daily.     calcium citrate-vitamin D (CITRACAL+D) 315-200 MG-UNIT tablet Take 1 tablet by mouth daily.   Cholecalciferol (VITAMIN D3) 1000 UNITS CAPS Take 1 capsule by mouth daily.   clobetasol cream (TEMOVATE) 7.68 % Apply 1 application topically See admin instructions.  Apply to affected areas of body 1 - 2 times daily as needed for itchy bumps. Avoid applying to face, groin, and axilla. Use as directed.   clobetasol cream (TEMOVATE) 0.05 % Apply twice daily to affected areas up to 2 weeks as needed for rash/itching. Avoid applying to face, groin, and axilla.   Diclofenac Sodium 3 % GEL Apply topically 2 (two) times daily.   Docusate Sodium (DSS) 100 MG CAPS Take by mouth.   DULoxetine (CYMBALTA) 60 MG capsule Take 60 mg by mouth daily.   ELMIRON 100 MG capsule Take 100 mg by mouth 3 (three) times daily.   Fluocinolone Acetonide 0.01 % OIL Apply 1-2 drops into ears once to twice daily as needed.   fluocinonide (LIDEX) 0.05 % external solution Apply topically 2 (two) times daily. Apply drops to scalp for itch   furosemide (LASIX) 20 MG tablet TAKE 1 TABLET BY MOUTH DAILY AS NEEDED   gabapentin (NEURONTIN) 300 MG capsule Take two tablets tid (in am, midday and q hs).   HYDROcodone-acetaminophen (NORCO) 10-325 MG tablet Take 1 tablet by mouth 3 (three) times daily.   Infant Care Products [facility]) OINT Apply topically as directed (as discussed).   ipratropium (ATROVENT) 0.03 % nasal spray Place 2 sprays into both nostrils every 12 (twelve) hours.   ketoconazole (NIZORAL) 2 % shampoo Apply 1 application topically See admin instructions. apply three times per week, massage into scalp and leave in for 10 minutes before rinsing out   LINZESS 145 MCG CAPS capsule Take 145 mcg by mouth daily.   magnesium oxide (MAG-OX) 400 MG tablet Take 1 tablet (400 mg total) by mouth daily.   Melatonin 10 MG CAPS Take 1 capsule by mouth.   meloxicam (MOBIC) 15 MG tablet Take 15 mg by mouth daily.   methocarbamol (ROBAXIN)  500 MG tablet Take 500 mg by mouth 3 (three) times daily. As needed   metoprolol succinate (TOPROL-XL) 50 MG 24 hr tablet TAKE 1 TABLET BY MOUTH DAILY. TAKE WITH OR IMMEDIATELY FOLLOWING A MEAL.   montelukast (SINGULAIR) 10 MG tablet 10 mg once daily as needed   morphine (MS CONTIN) 60 MG 12 hr tablet Take 1 tablet (60 mg total) by mouth 2 (two) times daily.   mupirocin ointment (BACTROBAN) 2 % Apply 1 application topically 2 (two) times daily.   mupirocin ointment (BACTROBAN) 2 % Apply 1 application topically daily. Apply to left knee until healed   Naldemedine Tosylate 0.2 MG TABS Take 0.2 mg by mouth daily.   nystatin (MYCOSTATIN/NYSTOP) powder Apply 1 application. topically 2 (two) times daily as needed.   polyethylene glycol powder (GLYCOLAX/MIRALAX) 17 GM/SCOOP powder MIX 17 GRAMS AS MARKED ON BOTTLE TOP IN 8 OUNCES OF WATER AND DRINK ONCE A DAY AS DIRECTED.   predniSONE (DELTASONE) 20 MG tablet Take 1 tablet (20 mg total) by mouth daily with breakfast.   RABEprazole (ACIPHEX) 20 MG tablet TAKE 1 TABLET BY MOUTH TWICE A DAY BEFORE A MEAL   senna (SENOKOT) 8.6 MG TABS tablet Take 1 tablet by mouth 2 (two) times daily.   Simethicone (GAS-X PO) Take 2 tablets by mouth 2 (two) times daily.   sodium chloride (OCEAN) 0.65 % nasal spray Place 1 spray into the nose as needed.   SYNTHROID 100 MCG tablet Take 1 tablet (100 mcg total) by mouth daily.   triamcinolone ointment (KENALOG) 0.1 % APPLY TWICE DAILY TO BITES AND RASH UNTIL FLAT AND SMOOTH **DO NOT APPLY TO FACE**   No  facility-administered encounter medications on file as of 03/27/2022.     Lab Results  Component Value Date   WBC 6.1 02/28/2022   HGB 10.0 (L) 02/28/2022   HCT 31.4 (L) 02/28/2022   PLT 113 (L) 02/28/2022   GLUCOSE 124 (H) 02/28/2022   CHOL 143 01/21/2022   TRIG 108.0 01/21/2022   HDL 57.90 01/21/2022   LDLCALC 63 01/21/2022   ALT 13 01/21/2022   AST 15 01/21/2022   NA 138 02/28/2022   K 3.9 02/28/2022   CL 98  02/28/2022   CREATININE 1.00 02/28/2022   BUN 20 02/28/2022   CO2 31 02/28/2022   TSH 4.63 01/21/2022   INR 0.89 08/06/2018   HGBA1C 6.0 10/19/2014    MM 3D SCREEN BREAST BILATERAL  Result Date: 03/20/2022 CLINICAL DATA:  Screening. EXAM: DIGITAL SCREENING BILATERAL MAMMOGRAM WITH TOMOSYNTHESIS AND CAD TECHNIQUE: Bilateral screening digital craniocaudal and mediolateral oblique mammograms were obtained. Bilateral screening digital breast tomosynthesis was performed. The images were evaluated with computer-aided detection. COMPARISON:  Previous exam(s). ACR Breast Density Category b: There are scattered areas of fibroglandular density. FINDINGS: There are no findings suspicious for malignancy. IMPRESSION: No mammographic evidence of malignancy. A result letter of this screening mammogram will be mailed directly to the patient. RECOMMENDATION: Screening mammogram in one year. (Code:SM-B-01Y) BI-RADS CATEGORY  1: Negative. Electronically Signed   By: Everlean Alstrom M.D.   On: 03/20/2022 13:23       Assessment & Plan:   Problem List Items Addressed This Visit   None    Einar Pheasant, MD

## 2022-03-28 ENCOUNTER — Encounter: Payer: Self-pay | Admitting: Internal Medicine

## 2022-03-28 DIAGNOSIS — N3010 Interstitial cystitis (chronic) without hematuria: Secondary | ICD-10-CM | POA: Insufficient documentation

## 2022-03-28 DIAGNOSIS — J323 Chronic sphenoidal sinusitis: Secondary | ICD-10-CM | POA: Diagnosis not present

## 2022-03-28 NOTE — Assessment & Plan Note (Signed)
Continues on gabapentin.  Follow.

## 2022-03-28 NOTE — Assessment & Plan Note (Signed)
On thyroid replacement.  Follow tsh.  

## 2022-03-28 NOTE — Assessment & Plan Note (Signed)
Continue aciphex.  Followed by GI.

## 2022-03-28 NOTE — Assessment & Plan Note (Signed)
On lipitor.  Low cholesterol diet and exercise.  Follow lipid panel and liver function tests.   

## 2022-03-28 NOTE — Assessment & Plan Note (Signed)
Open lesion as outlined.  No evidence of infection.  Discussed needing to keep pressure off of the area.  Using a doughnut pillow.  dermacloud as directed.  Follow.

## 2022-03-28 NOTE — Assessment & Plan Note (Signed)
Seeing [facility] - recommended home health PT.  Just evaluated 03/19/22.

## 2022-03-28 NOTE — Assessment & Plan Note (Signed)
Continue lipitor.

## 2022-03-28 NOTE — Assessment & Plan Note (Signed)
Followed by pulmonary.  Continue nighttime oxygen.

## 2022-03-28 NOTE — Assessment & Plan Note (Signed)
Persistent pain.  Seeing ortho. Planning for injection 04/03/22.

## 2022-03-28 NOTE — Assessment & Plan Note (Signed)
On lasix.  Lower extremity swelling improved.  No evidence of volume overload.  Follow.

## 2022-03-28 NOTE — Assessment & Plan Note (Signed)
Blood pressure as outlined.  Continue current medication - metoprolol, benazepril and amlodipine.  Follow metabolic panel.

## 2022-03-28 NOTE — Assessment & Plan Note (Signed)
Seeing urology.  Just evaluated.  Stopped keflex.  No infection in > 1 year.  Discussed vaginal estrogen.  She plans to discuss baclofen and hydroxyzine with urology.

## 2022-03-28 NOTE — Assessment & Plan Note (Signed)
Continue compression hose.

## 2022-03-28 NOTE — Assessment & Plan Note (Signed)
Using oxygen at night.  Breathing baseline.

## 2022-04-01 ENCOUNTER — Inpatient Hospital Stay: Payer: Medicare HMO

## 2022-04-01 DIAGNOSIS — E611 Iron deficiency: Secondary | ICD-10-CM

## 2022-04-01 DIAGNOSIS — I10 Essential (primary) hypertension: Secondary | ICD-10-CM

## 2022-04-01 DIAGNOSIS — I509 Heart failure, unspecified: Secondary | ICD-10-CM

## 2022-04-01 DIAGNOSIS — R21 Rash and other nonspecific skin eruption: Secondary | ICD-10-CM | POA: Diagnosis not present

## 2022-04-01 DIAGNOSIS — L299 Pruritus, unspecified: Secondary | ICD-10-CM | POA: Diagnosis not present

## 2022-04-01 DIAGNOSIS — I110 Hypertensive heart disease with heart failure: Secondary | ICD-10-CM | POA: Diagnosis not present

## 2022-04-01 DIAGNOSIS — E039 Hypothyroidism, unspecified: Secondary | ICD-10-CM | POA: Diagnosis not present

## 2022-04-01 DIAGNOSIS — M5440 Lumbago with sciatica, unspecified side: Secondary | ICD-10-CM | POA: Diagnosis not present

## 2022-04-01 DIAGNOSIS — G8929 Other chronic pain: Secondary | ICD-10-CM

## 2022-04-01 DIAGNOSIS — M1990 Unspecified osteoarthritis, unspecified site: Secondary | ICD-10-CM | POA: Diagnosis not present

## 2022-04-01 DIAGNOSIS — D649 Anemia, unspecified: Secondary | ICD-10-CM

## 2022-04-01 DIAGNOSIS — I272 Pulmonary hypertension, unspecified: Secondary | ICD-10-CM | POA: Diagnosis not present

## 2022-04-01 DIAGNOSIS — M7122 Synovial cyst of popliteal space [Baker], left knee: Secondary | ICD-10-CM | POA: Diagnosis not present

## 2022-04-01 DIAGNOSIS — Z7952 Long term (current) use of systemic steroids: Secondary | ICD-10-CM | POA: Diagnosis not present

## 2022-04-01 LAB — CBC WITH DIFFERENTIAL/PLATELET
Abs Immature Granulocytes: 0.04 10*3/uL (ref 0.00–0.07)
Basophils Absolute: 0 10*3/uL (ref 0.0–0.1)
Basophils Relative: 0 %
Eosinophils Absolute: 0.1 10*3/uL (ref 0.0–0.5)
Eosinophils Relative: 1 %
HCT: 32.3 % — ABNORMAL LOW (ref 36.0–46.0)
Hemoglobin: 10.4 g/dL — ABNORMAL LOW (ref 12.0–15.0)
Immature Granulocytes: 0 %
Lymphocytes Relative: 17 %
Lymphs Abs: 1.6 10*3/uL (ref 0.7–4.0)
MCH: 33.4 pg (ref 26.0–34.0)
MCHC: 32.2 g/dL (ref 30.0–36.0)
MCV: 103.9 fL — ABNORMAL HIGH (ref 80.0–100.0)
Monocytes Absolute: 0.8 10*3/uL (ref 0.1–1.0)
Monocytes Relative: 8 %
Neutro Abs: 7.2 10*3/uL (ref 1.7–7.7)
Neutrophils Relative %: 74 %
Platelets: 137 10*3/uL — ABNORMAL LOW (ref 150–400)
RBC: 3.11 MIL/uL — ABNORMAL LOW (ref 3.87–5.11)
RDW: 13.6 % (ref 11.5–15.5)
WBC: 9.7 10*3/uL (ref 4.0–10.5)
nRBC: 0 % (ref 0.0–0.2)

## 2022-04-01 LAB — BASIC METABOLIC PANEL
Anion gap: 6 (ref 5–15)
BUN: 18 mg/dL (ref 8–23)
CO2: 31 mmol/L (ref 22–32)
Calcium: 9.7 mg/dL (ref 8.9–10.3)
Chloride: 102 mmol/L (ref 98–111)
Creatinine, Ser: 0.94 mg/dL (ref 0.44–1.00)
GFR, Estimated: 58 mL/min — ABNORMAL LOW (ref >60)
Glucose, Bld: 126 mg/dL — ABNORMAL HIGH (ref 70–99)
Potassium: 3.8 mmol/L (ref 3.5–5.1)
Sodium: 139 mmol/L (ref 135–145)

## 2022-04-01 LAB — IRON AND TIBC
Iron: 122 ug/dL (ref 28–170)
Saturation Ratios: 50 % — ABNORMAL HIGH (ref 10.4–31.8)
TIBC: 246 ug/dL — ABNORMAL LOW (ref 250–450)
UIBC: 124 ug/dL

## 2022-04-01 LAB — VITAMIN B12: Vitamin B-12: 156 pg/mL — ABNORMAL LOW (ref 180–914)

## 2022-04-01 LAB — LACTATE DEHYDROGENASE: LDH: 134 U/L (ref 98–192)

## 2022-04-01 LAB — FERRITIN: Ferritin: 168 ng/mL (ref 11–307)

## 2022-04-02 ENCOUNTER — Ambulatory Visit: Payer: Medicare HMO | Admitting: Dermatology

## 2022-04-02 ENCOUNTER — Inpatient Hospital Stay: Payer: Medicare HMO | Attending: Internal Medicine | Admitting: Internal Medicine

## 2022-04-02 ENCOUNTER — Encounter: Payer: Self-pay | Admitting: Internal Medicine

## 2022-04-02 DIAGNOSIS — I272 Pulmonary hypertension, unspecified: Secondary | ICD-10-CM | POA: Insufficient documentation

## 2022-04-02 DIAGNOSIS — Z882 Allergy status to sulfonamides status: Secondary | ICD-10-CM | POA: Insufficient documentation

## 2022-04-02 DIAGNOSIS — Z86718 Personal history of other venous thrombosis and embolism: Secondary | ICD-10-CM | POA: Insufficient documentation

## 2022-04-02 DIAGNOSIS — Z79890 Hormone replacement therapy (postmenopausal): Secondary | ICD-10-CM | POA: Diagnosis not present

## 2022-04-02 DIAGNOSIS — M549 Dorsalgia, unspecified: Secondary | ICD-10-CM | POA: Diagnosis not present

## 2022-04-02 DIAGNOSIS — I110 Hypertensive heart disease with heart failure: Secondary | ICD-10-CM | POA: Diagnosis not present

## 2022-04-02 DIAGNOSIS — S80861A Insect bite (nonvenomous), right lower leg, initial encounter: Secondary | ICD-10-CM | POA: Diagnosis not present

## 2022-04-02 DIAGNOSIS — Z823 Family history of stroke: Secondary | ICD-10-CM | POA: Diagnosis not present

## 2022-04-02 DIAGNOSIS — R5383 Other fatigue: Secondary | ICD-10-CM | POA: Insufficient documentation

## 2022-04-02 DIAGNOSIS — L309 Dermatitis, unspecified: Secondary | ICD-10-CM

## 2022-04-02 DIAGNOSIS — M1990 Unspecified osteoarthritis, unspecified site: Secondary | ICD-10-CM | POA: Insufficient documentation

## 2022-04-02 DIAGNOSIS — I509 Heart failure, unspecified: Secondary | ICD-10-CM | POA: Insufficient documentation

## 2022-04-02 DIAGNOSIS — Z888 Allergy status to other drugs, medicaments and biological substances status: Secondary | ICD-10-CM | POA: Insufficient documentation

## 2022-04-02 DIAGNOSIS — Z881 Allergy status to other antibiotic agents status: Secondary | ICD-10-CM | POA: Diagnosis not present

## 2022-04-02 DIAGNOSIS — Z79899 Other long term (current) drug therapy: Secondary | ICD-10-CM | POA: Insufficient documentation

## 2022-04-02 DIAGNOSIS — R21 Rash and other nonspecific skin eruption: Secondary | ICD-10-CM | POA: Insufficient documentation

## 2022-04-02 DIAGNOSIS — Z9049 Acquired absence of other specified parts of digestive tract: Secondary | ICD-10-CM | POA: Insufficient documentation

## 2022-04-02 DIAGNOSIS — E538 Deficiency of other specified B group vitamins: Secondary | ICD-10-CM | POA: Diagnosis not present

## 2022-04-02 DIAGNOSIS — D649 Anemia, unspecified: Secondary | ICD-10-CM | POA: Diagnosis not present

## 2022-04-02 DIAGNOSIS — D539 Nutritional anemia, unspecified: Secondary | ICD-10-CM | POA: Diagnosis not present

## 2022-04-02 DIAGNOSIS — Z8249 Family history of ischemic heart disease and other diseases of the circulatory system: Secondary | ICD-10-CM | POA: Insufficient documentation

## 2022-04-02 DIAGNOSIS — I872 Venous insufficiency (chronic) (peripheral): Secondary | ICD-10-CM | POA: Diagnosis not present

## 2022-04-02 DIAGNOSIS — Z8719 Personal history of other diseases of the digestive system: Secondary | ICD-10-CM | POA: Insufficient documentation

## 2022-04-02 DIAGNOSIS — D696 Thrombocytopenia, unspecified: Secondary | ICD-10-CM | POA: Insufficient documentation

## 2022-04-02 DIAGNOSIS — Z7952 Long term (current) use of systemic steroids: Secondary | ICD-10-CM | POA: Diagnosis not present

## 2022-04-02 DIAGNOSIS — W57XXXA Bitten or stung by nonvenomous insect and other nonvenomous arthropods, initial encounter: Secondary | ICD-10-CM | POA: Diagnosis not present

## 2022-04-02 DIAGNOSIS — E039 Hypothyroidism, unspecified: Secondary | ICD-10-CM | POA: Insufficient documentation

## 2022-04-02 DIAGNOSIS — S80862A Insect bite (nonvenomous), left lower leg, initial encounter: Secondary | ICD-10-CM

## 2022-04-02 DIAGNOSIS — L89312 Pressure ulcer of right buttock, stage 2: Secondary | ICD-10-CM

## 2022-04-02 DIAGNOSIS — M2550 Pain in unspecified joint: Secondary | ICD-10-CM | POA: Insufficient documentation

## 2022-04-02 NOTE — Progress Notes (Unsigned)
Follow-Up Visit   Subjective  Kimberly Roy is a 88 y.o. female who presents for the following: Follow-up (Patient here today for bx proven dermatitis follow up and suture removal. ).  Patient has had some itching at back and legs.   Patient accompanied by care giver who contributes to history.   The following portions of the chart were reviewed this encounter and updated as appropriate:   Tobacco  Allergies  Meds  Problems  Med Hx  Surg Hx  Fam Hx      Review of Systems:  No other skin or systemic complaints except as noted in HPI or Assessment and Plan.  Objective  Well appearing patient in no apparent distress; mood and affect are within normal limits.  A focused examination was performed including back, legs. Relevant physical exam findings are noted in the Assessment and Plan.  lower legs Erythematous, scaly patches involving the ankle and distal lower leg with associated lower leg edema.   lower legs Firm excoriated pink papules at legs  right inguinal fold Ulceration right gluteal crease; patch of fixed erythema with superficial erosions    Assessment & Plan  Venous stasis dermatitis of both lower extremities lower legs  Chronic and persistent condition with duration or expected duration over one year. Condition is bothersome/symptomatic for patient. Currently flared.  Patient does normally wear compression stockings   Start clobetasol cream twice daily for 1 week to lower legs.Avoid applying to face, groin, and axilla. Use as directed. Long-term use can cause thinning of the skin.  Topical steroids (such as triamcinolone, fluocinolone, fluocinonide, mometasone, clobetasol, halobetasol, betamethasone, hydrocortisone) can cause thinning and lightening of the skin if they are used for too long in the same area. Your physician has selected the right strength medicine for your problem and area affected on the body. Please use your medication only as directed by  your physician to prevent side effects.    Reaction to insect bite lower legs  Start clobetasol twice a day as needed. Avoid applying to face, groin, and axilla. Use as directed. Long-term use can cause thinning of the skin.  Topical steroids (such as triamcinolone, fluocinolone, fluocinonide, mometasone, clobetasol, halobetasol, betamethasone, hydrocortisone) can cause thinning and lightening of the skin if they are used for too long in the same area. Your physician has selected the right strength medicine for your problem and area affected on the body. Please use your medication only as directed by your physician to prevent side effects.    Eczema, unspecified type Left Upper Back  Clinically exam c/w eczema  Continue clobetasol twice daily to affected areas for up to 2 weeks as needed for itch. Avoid applying to face, groin, and axilla. Use as directed. Long-term use can cause thinning of the skin.  Topical steroids (such as triamcinolone, fluocinolone, fluocinonide, mometasone, clobetasol, halobetasol, betamethasone, hydrocortisone) can cause thinning and lightening of the skin if they are used for too long in the same area. Your physician has selected the right strength medicine for your problem and area affected on the body. Please use your medication only as directed by your physician to prevent side effects.   Consider oral prednisone, Dupixent in future   Pressure injury of right buttock, stage 2 (HCC) right inguinal fold  Counseled that it is critical to avoid pressure at the site to speed healing. Recommend donut pillow and frequent position changing.  Sample of Strata GRT given to patient to use twice daily and follow with Dermacloud. Continue  Dermacloud as prescribed by PCP   Rash and other nonspecific skin eruption Right Upper Back  Biopsy showed Skin , left upper back PERIVASCULAR DERMATITIS WITH EOSINOPHILS, ddx including hypersensitivity, contact dermatitis, id  reaction, early eczema  Will reach out to Dr. Bary Leriche office regarding recent medication changes to see if we can identify any new medications in the last 6 months.   Currently doing ok with regards to itch so will continue clobetasol twice a day as needed up to 2 weeks to affected areas on the body. Avoid applying to face, groin, and axilla. Use as directed. Long-term use can cause thinning of the skin.    Related Medications clobetasol cream (TEMOVATE) 2.70 % Apply 1 application topically See admin instructions. Apply to affected areas of body 1 - 2 times daily as needed for itchy bumps. Avoid applying to face, groin, and axilla. Use as directed.  mupirocin ointment (BACTROBAN) 2 % Apply 1 application topically daily. Apply to left knee until healed  Encounter for Removal of Sutures - Incision site at the left upper back is clean, dry and intact - Wound cleansed, sutures removed, wound cleansed and steri strips applied.  - Discussed pathology results showing dermatitis  - Patient advised to keep steri-strips dry until they fall off. - Scars remodel for a full year. - Once steri-strips fall off, patient can apply over-the-counter silicone scar cream each night to help with scar remodeling if desired. - Patient advised to call with any concerns or if they notice any new or changing lesions.  Return in about 4 weeks (around 04/30/2022).  Graciella Belton, RMA, am acting as scribe for Forest Gleason, MD .  Documentation: I have reviewed the above documentation for accuracy and completeness, and I agree with the above.  Forest Gleason, MD

## 2022-04-02 NOTE — Progress Notes (Signed)
[place] NOTE  Patient Care Team: Einar Pheasant, [name], [name], [name], [name], MD as Consulting Physician (Hematology and Oncology)  CHIEF COMPLAINTS/PURPOSE OF CONSULTATION: Anemia  # CHRONIC MILD ANEMIA hb ~11.[Previous Dr.Gittin pt]; [place] 2017- work up [facility] [employer]; stool card; Iron/ monoclonal work up- NEGATIVE] ; MARCH 2022- B12 WNL. BONE MARROW BIOPSY-JUNE 2023- DIAGNOSIS:   BONE MARROW, ASPIRATE, CLOT, CORE:  -Overall normocellular bone marrow with orderly trilineage  hematopoiesis, see note.  -  Essentially absent histiocytic iron stores   PERIPHERAL BLOOD:  -Macrocytic anemia and thrombocytopenia.   NOTE:  While the biopsy is inadequate for interpretation the overall aspirate  and clot section are adequate and revealed an otherwise normocellular  bone marrow.  While there is orderly maturation and unremarkable  morphology to the myeloid and erythroid lineages a compensatory  erythroid hyperplasia with left shift is not definitively identified.  There is a mild megakaryocytic hyperplasia with some atypical forms;  however, this may simply be a compensatory megakaryocytic hyperplasia  with left shift.  There is no definitive dysplasia in any of the 3  lineages.  Given the borderline plasmacytosis by aspirate morphology  immunohistochemical stains were performed identifying a normal number of  polyclonal plasma cells.   It should be noted that while my indices did anemia is macrocytic there  is essentially absent histiocytic iron stores.  A morphologic etiology  for the patient's anemia and thrombocytopenia is not identified.  Correlation with pending conventional cytogenetics is recommended to  fully exclude a possible myelodysplastic syndrome.  If clinically  indicated additional molecular  testing such as a next generation myeloid  panel could be considered.   # CHRONIC INTERSTITIAL CYSTITIS/ wheel chair bound  Oncology History   No history exists.    HISTORY OF PRESENTING ILLNESS: Accompanied by caregiver/son  In wheel chair.   Kimberly Roy 88 y.o.  female  with a history of chronic anemia- > 10 years and mild thrombocytopenia-possible iron deficiency is here for follow-up.  Patient status post IV iron infusion Venofer x2.  Noted to have a skin rash.  Needed steroids prednisone resolved.  Patient has poor tolerance to oral iron previously.  Concerned about constipation.  Complains of ongoing fatigue.  Review of Systems  Constitutional:  Positive for malaise/fatigue. Negative for chills, diaphoresis and fever.  HENT:  Negative for nosebleeds and sore throat.   Eyes:  Negative for double vision.  Respiratory:  Negative for cough, hemoptysis, sputum production, shortness of breath and wheezing.   Cardiovascular:  Negative for chest pain, palpitations, orthopnea and leg swelling.  Gastrointestinal:  Negative for abdominal pain, blood in stool, constipation, diarrhea, heartburn, melena, nausea and vomiting.  Genitourinary:  Negative for dysuria, frequency and urgency.  Musculoskeletal:  Positive for back pain and joint pain.  Skin: Negative.  Negative for itching and rash.  Neurological:  Negative for dizziness, tingling, focal weakness, weakness and headaches.  Endo/Heme/Allergies:  Does not bruise/bleed easily.  Psychiatric/Behavioral:  Negative for depression. The patient is not nervous/anxious and does not have insomnia.      MEDICAL HISTORY:  Past Medical History:  Diagnosis Date   Anemia    Anxiety    Chest pain    CHF (congestive heart failure) (HCC)    Constipation    DDD (degenerative disc disease), cervical    Depression    DVT (deep venous thrombosis) (  [place])    Dysphonia    Dyspnea    Fatty liver    Fatty liver    Headache    Hyperlipidemia     Hyperpiesia    Hypertension    Hypothyroidism    Interstitial cystitis    Left ventricular dysfunction    Lymphedema    Nephrolithiasis    Obstructive sleep apnea    Osteoarthritis    knees/cervical and lumbar spine   Pulmonary hypertension (HCC)    Pulmonary nodules    followed by Dr Raul Del   Pure hypercholesterolemia    Renal cyst    right    SURGICAL HISTORY: Past Surgical History:  Procedure Laterality Date   ABDOMINAL HYSTERECTOMY     ovaries left in place   APPENDECTOMY     Back Surgeries     BACK SURGERY     BREAST REDUCTION SURGERY     3/99   CARDIAC CATHETERIZATION     cataracts Bilateral    CERVICAL SPINE SURGERY     ESOPHAGEAL MANOMETRY N/A 08/02/2015   Procedure: ESOPHAGEAL MANOMETRY (EM);  Surgeon: Josefine Class, MD;  Location: [facility] ENDOSCOPY;  Service: Endoscopy;  Laterality: N/A;   ESOPHAGOGASTRODUODENOSCOPY N/A 02/27/2015   Procedure: ESOPHAGOGASTRODUODENOSCOPY (EGD);  Surgeon: Hulen Luster, MD;  Location[facility] ENDOSCOPY;  Service: Gastroenterology;  Laterality: N/A;   ESOPHAGOGASTRODUODENOSCOPY (EGD) WITH PROPOFOL N/A 10/30/2018   Procedure: ESOPHAGOGASTRODUODENOSCOPY (EGD) WITH PROPOFOL;  Surgeon: Lollie Sails, MD;  Location: [facility] ENDOSCOPY;  Service: Endoscopy;  Laterality: N/A;   EXCISIONAL HEMORRHOIDECTOMY     EYE SURGERY     FRACTURE SURGERY     HEMORRHOID SURGERY     HIP SURGERY  2013   Right hip surgery   JOINT REPLACEMENT     KNEE ARTHROSCOPY     left and right   ORIF FEMUR FRACTURE Right 08/07/2018   Procedure: OPEN REDUCTION INTERNAL FIXATION (ORIF) DISTAL FEMUR FRACTURE;  Surgeon: Hessie Knows, MD;  Location: [facility] ORS;  Service: Orthopedics;  Laterality: Right;   REDUCTION MAMMAPLASTY Bilateral YRS AGO   REPLACEMENT TOTAL KNEE Bilateral    rotator cuff surgery     blilateral   TONSILECTOMY/ADENOIDECTOMY WITH MYRINGOTOMY     VISCERAL ARTERY INTERVENTION N/A 02/25/2019   Procedure: VISCERAL ARTERY INTERVENTION;  Surgeon: Algernon Huxley, MD;  Location: [facility];  Service: Cardiovascular;  Laterality: N/A;    SOCIAL HISTORY: Social History   Socioeconomic History   Marital status: Widowed    Spouse name: Not on file   Number of children: 2   Years of education: 14   Highest education level: 12th grade  Occupational History    Comment: insurance agency  Tobacco Use   Smoking status: Never   Smokeless tobacco: Never  Vaping Use   Vaping Use: Never used  Substance and Sexual Activity   Alcohol use: No    Alcohol/week: 0.0 standard drinks of alcohol   Drug use: Never   Sexual activity: Not Currently  Other Topics Concern   Not on file  Social History Narrative   Not on file   Social Determinants of Health   Financial Resource Strain: Low Risk  (03/09/2021)   Overall Financial Resource Strain (CARDIA)    Difficulty of Paying Living Expenses: Not hard at all  Food Insecurity: No Food Insecurity (11/15/2021)   Hunger Vital Sign    Worried About Running Out of Food in the Last Year: Never true    Ran Out of Food in the  Last Year: Never true  Transportation Needs: Unmet Transportation Needs (11/30/2021)   PRAPARE - Transportation    Lack of Transportation (Medical): Yes    Lack of Transportation (Non-Medical): Yes  Physical Activity: Inactive (03/09/2021)   Exercise Vital Sign    Days of Exercise per Week: 0 days    Minutes of Exercise per Session: 0 min  Stress: No Stress Concern Present (03/09/2021)   [place]    Feeling of Stress : Only a little  Social Connections: Socially Isolated (11/15/2021)   Social Connection and Isolation Panel [NHANES]    Frequency of Communication with Friends and Family: Three times a week    Frequency of Social Gatherings with Friends and Family: Twice a week    Attends Religious Services: Never    Marine scientist or Organizations: No    Attends Archivist Meetings: Never     Marital Status: Widowed  Intimate Partner Violence: Not At Risk (11/15/2021)   Humiliation, Afraid, Rape, and Kick questionnaire    Fear of Current or Ex-Partner: No    Emotionally Abused: No    Physically Abused: No    Sexually Abused: No    FAMILY HISTORY: Family History  Problem Relation Age of Onset   Heart disease Mother    Stroke Mother    Hypertension Mother    Heart disease Father        myocardial infarction age 23   Breast cancer Neg Hx     ALLERGIES:  is allergic to lyrica [pregabalin], omnicef [cefdinir], atarax [hydroxyzine], dicyclomine, hydroxyzine hcl, levaquin [levofloxacin], nitrofurantoin, nucynta er [tapentadol hcl er], oxybutynin, zoloft [sertraline hcl], biaxin [clarithromycin], meloxicam, sertraline, sulfa antibiotics, sulfasalazine, tape, tapentadol, and venofer [iron sucrose].  MEDICATIONS:  Current Outpatient Medications  Medication Sig Dispense Refill   albuterol (VENTOLIN HFA) 108 (90 Base) MCG/ACT inhaler Inhale 2 puffs into the lungs every 6 (six) hours as needed for wheezing or shortness of breath. 18 g 0   aluminum-magnesium hydroxide-simethicone (MAALOX) 062-694-85 MG/5ML SUSP Take 15 mLs by mouth 4 (four) times daily -  before meals and at bedtime.      amLODipine (NORVASC) 10 MG tablet TAKE 1 TABLET BY MOUTH DAILY 90 tablet 1   aspirin 81 MG EC tablet Take by mouth.     atorvastatin (LIPITOR) 20 MG tablet TAKE 1 TABLET BY MOUTH AT BEDTIME 90 tablet 1   benazepril (LOTENSIN) 40 MG tablet TAKE 1 TABLET BY MOUTH DAILY 90 tablet 1   betamethasone dipropionate 0.05 % cream Apply 1 application topically 2 (two) times daily.     busPIRone (BUSPAR) 10 MG tablet Take 10 mg by mouth 2 (two) times daily.      calcium citrate-vitamin D (CITRACAL+D) 315-200 MG-UNIT tablet Take 1 tablet by mouth daily.     Cholecalciferol (VITAMIN D3) 1000 UNITS CAPS Take 1 capsule by mouth daily.     clobetasol cream (TEMOVATE) 4.62 % Apply 1 application topically See admin  instructions. Apply to affected areas of body 1 - 2 times daily as needed for itchy bumps. Avoid applying to face, groin, and axilla. Use as directed. 60 g 1   Diclofenac Sodium 3 % GEL Apply topically 2 (two) times daily.     Docusate Sodium (DSS) 100 MG CAPS Take by mouth.     DULoxetine (CYMBALTA) 60 MG capsule Take 60 mg by mouth daily.     ELMIRON 100 MG capsule Take 100 mg by mouth 3 (  three) times daily.     Fluocinolone Acetonide 0.01 % OIL Apply 1-2 drops into ears once to twice daily as needed. 20 mL 3   fluocinonide (LIDEX) 0.05 % external solution Apply topically 2 (two) times daily. Apply drops to scalp for itch 60 mL 3   furosemide (LASIX) 20 MG tablet TAKE 1 TABLET BY MOUTH DAILY AS NEEDED 90 tablet 0   gabapentin (NEURONTIN) 300 MG capsule Take two tablets tid (in am, midday and q hs). 180 capsule 2   HYDROcodone-acetaminophen (NORCO) 10-325 MG tablet Take 1 tablet by mouth 3 (three) times daily. 10 tablet 0   Infant Care Products [facility]) OINT Apply topically as directed (as discussed). 430 g 1   ipratropium (ATROVENT) 0.03 % nasal spray Place 2 sprays into both nostrils every 12 (twelve) hours. 30 mL 1   ketoconazole (NIZORAL) 2 % shampoo Apply 1 application topically See admin instructions. apply three times per week, massage into scalp and leave in for 10 minutes before rinsing out 120 mL 3   LINZESS 145 MCG CAPS capsule Take 145 mcg by mouth daily.     magnesium oxide (MAG-OX) 400 MG tablet Take 1 tablet (400 mg total) by mouth daily. 30 tablet 1   Melatonin 10 MG CAPS Take 1 capsule by mouth.     meloxicam (MOBIC) 15 MG tablet Take 15 mg by mouth daily.     methocarbamol (ROBAXIN) 500 MG tablet Take 500 mg by mouth 3 (three) times daily. As needed     metoprolol succinate (TOPROL-XL) 50 MG 24 hr tablet TAKE 1 TABLET BY MOUTH DAILY. TAKE WITH OR IMMEDIATELY FOLLOWING A MEAL. 90 tablet 1   montelukast (SINGULAIR) 10 MG tablet 10 mg once daily as needed     morphine (MS  CONTIN) 60 MG 12 hr tablet Take 1 tablet (60 mg total) by mouth 2 (two) times daily. 10 tablet 0   mupirocin ointment (BACTROBAN) 2 % Apply 1 application topically 2 (two) times daily. 22 g 0   mupirocin ointment (BACTROBAN) 2 % Apply 1 application topically daily. Apply to left knee until healed 22 g 0   Naldemedine Tosylate 0.2 MG TABS Take 0.2 mg by mouth daily.     nystatin (MYCOSTATIN/NYSTOP) powder Apply 1 application. topically 2 (two) times daily as needed. 60 g 0   polyethylene glycol powder (GLYCOLAX/MIRALAX) 17 GM/SCOOP powder MIX 17 GRAMS AS MARKED ON BOTTLE TOP IN 8 OUNCES OF WATER AND DRINK ONCE A DAY AS DIRECTED. 527 g 0   predniSONE (DELTASONE) 20 MG tablet Take 1 tablet (20 mg total) by mouth daily with breakfast. 5 tablet 0   RABEprazole (ACIPHEX) 20 MG tablet TAKE 1 TABLET BY MOUTH TWICE A DAY BEFORE A MEAL 180 tablet 1   senna (SENOKOT) 8.6 MG TABS tablet Take 1 tablet by mouth 2 (two) times daily.     Simethicone (GAS-X PO) Take 2 tablets by mouth 2 (two) times daily.     sodium chloride (OCEAN) 0.65 % nasal spray Place 1 spray into the nose as needed.     SYNTHROID 100 MCG tablet Take 1 tablet (100 mcg total) by mouth daily. 90 tablet 3   triamcinolone ointment (KENALOG) 0.1 % APPLY TWICE DAILY TO BITES AND RASH UNTIL FLAT AND SMOOTH **DO NOT APPLY TO FACE** 80 g 0   No current facility-administered medications for this visit.      Marland Kitchen  PHYSICAL EXAMINATION: ECOG PERFORMANCE STATUS: 0 - Asymptomatic  Vitals:   04/02/22  1456  BP: (!) 145/52  Pulse: (!) 55  Resp: 19  Temp: 98.7 F (37.1 C)  SpO2: 97%   Filed Weights   04/02/22 1456  Weight: 173 lb 12.8 oz (78.8 kg)     Physical Exam Constitutional:      Comments: Patient in wheelchair because of chronic arthritic problems.  HENT:     Head: Normocephalic and atraumatic.     Mouth/Throat:     Pharynx: No oropharyngeal exudate.  Eyes:     Pupils: Pupils are equal, round, and reactive to light.   Cardiovascular:     Rate and Rhythm: Normal rate and regular rhythm.  Pulmonary:     Effort: Pulmonary effort is normal. No respiratory distress.     Breath sounds: Normal breath sounds. No wheezing.  Abdominal:     General: Bowel sounds are normal. There is no distension.     Palpations: Abdomen is soft. There is no mass.     Tenderness: There is no abdominal tenderness. There is no guarding or rebound.  Musculoskeletal:        General: No tenderness. Normal range of motion.     Cervical back: Normal range of motion and neck supple.  Skin:    General: Skin is warm.  Neurological:     Mental Status: She is alert and oriented to person, place, and time.  Psychiatric:        Mood and Affect: Affect normal.     LABORATORY DATA:  I have reviewed the data as listed Lab Results  Component Value Date   WBC 9.7 04/01/2022   HGB 10.4 (L) 04/01/2022   HCT 32.3 (L) 04/01/2022   MCV 103.9 (H) 04/01/2022   PLT 137 (L) 04/01/2022   Recent Labs    05/14/21 1351 06/27/21 1236 09/26/21 1317 01/21/22 1200 01/23/22 1256 02/28/22 1116 04/01/22 1345  NA 143 137   < > 140 137 138 139  K 4.4 4.4   < > 4.7 4.5 3.9 3.8  CL 100 98   < > 101 98 98 102  CO2 35* 33*   < > 33* 32 31 31  GLUCOSE 86 76   < > 84 77 124* 126*  BUN 16 24*   < > 23 25* 20 18  CREATININE 0.77 0.93   < > 1.02 0.94 1.00 0.94  CALCIUM 10.2 9.3   < > 10.0 9.3 9.0 9.7  GFRNONAA  --  59*   < >  --  58* 54* 58*  PROT 6.4 6.5  --  6.4  --   --   --   ALBUMIN 4.0 4.0  --  4.3  --   --   --   AST 17 20  --  15  --   --   --   ALT 11 15  --  13  --   --   --   ALKPHOS 78 72  --  96  --   --   --   BILITOT 0.4 0.7  --  0.4  --   --   --   BILIDIR 0.1  --   --  0.1  --   --   --    < > = values in this interval not displayed.    RADIOGRAPHIC STUDIES: I have personally reviewed the radiological images as listed and agreed with the findings in the report. US Venous Img Lower Unilateral Left (DVT)  Result Date:  04/10/2022 CLINICAL DATA:  Left lower extremity swelling EXAM: LEFT LOWER EXTREMITY VENOUS DOPPLER ULTRASOUND TECHNIQUE: Gray-scale sonography with compression, as well as color and duplex ultrasound, were performed to evaluate the deep venous system(s) from the level of the common femoral vein through the popliteal and proximal calf veins. COMPARISON:  12/20/2021 FINDINGS: VENOUS Normal compressibility of the common femoral, superficial femoral, and popliteal veins, as well as the visualized calf veins. Visualized portions of profunda femoral vein and great saphenous vein unremarkable. No filling defects to suggest DVT on grayscale or color Doppler imaging. Doppler waveforms show normal direction of venous flow, normal respiratory plasticity and response to augmentation. Limited views of the contralateral common femoral vein are unremarkable. OTHER None. Limitations: none IMPRESSION: Negative. Electronically Signed   By: Merilyn Baba M.D.   On: 04/10/2022 17:32   MM 3D SCREEN BREAST BILATERAL  Result Date: 03/20/2022 CLINICAL DATA:  Screening. EXAM: DIGITAL SCREENING BILATERAL MAMMOGRAM WITH TOMOSYNTHESIS AND CAD TECHNIQUE: Bilateral screening digital craniocaudal and mediolateral oblique mammograms were obtained. Bilateral screening digital breast tomosynthesis was performed. The images were evaluated with computer-aided detection. COMPARISON:  Previous exam(s). ACR Breast Density Category b: There are scattered areas of fibroglandular density. FINDINGS: There are no findings suspicious for malignancy. IMPRESSION: No mammographic evidence of malignancy. A result letter of this screening mammogram will be mailed directly to the patient. RECOMMENDATION: Screening mammogram in one year. (Code:SM-B-01Y) BI-RADS CATEGORY  1: Negative. Electronically Signed   By: Everlean Alstrom M.D.   On: 03/20/2022 13:23    ASSESSMENT & PLAN:   Symptomatic anemia #  Chronic anemia-unclear etiology-hemoglobin around 10-mild  thrombocytopenia greater than 100 platelets   mild symptoms of ongoing fatigue.  JAN 2023--ferritin 101 saturation 41%%; June 2023 all the peripheral blood shows mild macrocytic anemia-bone marrow biopsy shows Overall normocellular bone marrow with orderly trilineage  hematopoiesis,-  Essentially absent histiocytic iron stores; conventional cytogenetics normal.    #Given the absence of iron stores-status post Venofer-hemoglobin today is 10.8-all all stable.  Hold off IV iron infusion-Venofer given skin rash.  Recommend gentle iron 1 pill a day; less likely to cause upset stomach or cause constipation.   Care giver-[place]  # DISPOSITION:  # HOLD venofer today # cancel AUG 29th appts # follow up in 2 months- MD; labs-cbc/bmp- Dr.B  Addendum: August 2023 -B12 level low; recommend monthly B12 injections.  I tried to reach the patient unable to reach her; unable to leave voicemail.  Staff will reach out to the patient.        Cammie Sickle, MD 04/12/2022 8:06 PM

## 2022-04-02 NOTE — Patient Instructions (Signed)
#  Recommend gentle iron 1 pill a day; should not upset your stomach or cause constipation.  Talk to the pharmacist if you can find it/it is over-the-counter.

## 2022-04-02 NOTE — Assessment & Plan Note (Addendum)
#  Chronic anemia-unclear etiology-hemoglobin around 10-mild thrombocytopenia greater than 100 platelets   mild symptoms of ongoing fatigue.  JAN 2023--ferritin 101 saturation 41%%; June 2023 all the peripheral blood shows mild macrocytic anemia-bone marrow biopsy shows Overall normocellular bone marrow with orderly trilineage  hematopoiesis,- Essentially absent histiocytic iron stores; conventional cytogenetics normal.    #Given the absence of iron stores-status post Venofer-hemoglobin today is 10.8-all all stable.  Hold off IV iron infusion-Venofer given skin rash.  Recommend gentle iron 1 pill a day; less likely to cause upset stomach or cause constipation.   Care giver-[place]  # DISPOSITION:  # HOLD venofer today # cancel AUG 29th appts # follow up in 2 months- MD; labs-cbc/bmp- Dr.B  Addendum: August 2023 -B12 level low; recommend monthly B12 injections.  I tried to reach the patient unable to reach her; unable to leave voicemail.  Staff will reach out to the patient.

## 2022-04-02 NOTE — Patient Instructions (Addendum)
Start clobetasol cream twice daily for 1 week to lower legs.Avoid applying to face, groin, and axilla. Use as directed. Long-term use can cause thinning of the skin.  Sample of Strata GRT given to patient to use twice daily and follow with [place].   Topical steroids (such as triamcinolone, fluocinolone, fluocinonide, mometasone, clobetasol, halobetasol, betamethasone, hydrocortisone) can cause thinning and lightening of the skin if they are used for too long in the same area. Your physician has selected the right strength medicine for your problem and area affected on the body. Please use your medication only as directed by your physician to prevent side effects.   Due to recent changes in healthcare laws, you may see results of your pathology and/or laboratory studies on MyChart before the doctors have had a chance to review them. We understand that in some cases there may be results that are confusing or concerning to you. Please understand that not all results are received at the same time and often the doctors may need to interpret multiple results in order to provide you with the best plan of care or course of treatment. Therefore, we ask that you please give [place] 2 business days to thoroughly review all your results before contacting the office for clarification. Should we see a critical lab result, you will be contacted sooner.   If You Need Anything After Your Visit  If you have any questions or concerns for your doctor, please call our main line at [phone] and press option 4 to reach your doctor's medical assistant. If no one answers, please leave a voicemail as directed and we will return your call as soon as possible. Messages left after 4 pm will be answered the following business day.   You may also send [place] a message via [place]. We typically respond to MyChart messages within 1-2 business days.  For prescription refills, please ask your pharmacy to contact our office. Our fax  number is [phone].  If you have an urgent issue when the clinic is closed that cannot wait until the next business day, you can page your doctor at the number below.    Please note that while we do our best to be available for urgent issues outside of office hours, we are not available 24/7.   If you have an urgent issue and are unable to reach [place], you may choose to seek medical care at your doctor's office, retail clinic, urgent care center, or emergency room.  If you have a medical emergency, please immediately call 911 or go to the emergency department.  Pager Numbers  - Dr. Nehemiah Massed: [phone]  - Dr. Laurence Ferrari: [phone]  - Dr. Nicole Kindred: [phone]  In the event of inclement weather, please call our main line at [phone] for an update on the status of any delays or closures.  Dermatology Medication Tips: Please keep the boxes that topical medications come in in order to help keep track of the instructions about where and how to use these. Pharmacies typically print the medication instructions only on the boxes and not directly on the medication tubes.   If your medication is too expensive, please contact our office at [phone] option 4 or send [place] a message through [place].   We are unable to tell what your co-pay for medications will be in advance as this is different depending on your insurance coverage. However, we may be able to find a substitute medication at lower cost or fill out paperwork to get insurance to cover  a needed medication.   If a prior authorization is required to get your medication covered by your insurance company, please allow [place] 1-2 business days to complete this process.  Drug prices often vary depending on where the prescription is filled and some pharmacies may offer cheaper prices.  The website www.goodrx.com contains coupons for medications through different pharmacies. The prices here do not account for what the cost may be with help  from insurance (it may be cheaper with your insurance), but the website can give you the price if you did not use any insurance.  - You can print the associated coupon and take it with your prescription to the pharmacy.  - You may also stop by our office during regular business hours and pick up a GoodRx coupon card.  - If you need your prescription sent electronically to a different pharmacy, notify our office through [facility] or by phone at [phone] option 4.     Si Usted Necesita Algo Despus de Su Visita  Tambin puede enviarnos un mensaje a travs de Pharmacist, community. Por lo general respondemos a los mensajes de MyChart en el transcurso de 1 a 2 das hbiles.  Para renovar recetas, por favor pida a su farmacia que se ponga en contacto con nuestra oficina. Harland Dingwall de fax es [place] [phone].  Si tiene un asunto urgente cuando la clnica est cerrada y que no puede esperar hasta el siguiente da hbil, puede llamar/localizar a su doctor(a) al nmero que aparece a continuacin.   Por favor, tenga en cuenta que aunque hacemos todo lo posible para estar disponibles para asuntos urgentes fuera del horario de [place], no estamos disponibles las 24 horas del da, los 7 das de la [place].   Si tiene un problema urgente y no puede comunicarse con nosotros, puede optar por buscar atencin mdica  en el consultorio de su doctor(a), en una clnica privada, en un centro de atencin urgente o en una sala de emergencias.  Si tiene Engineering geologist, por favor llame inmediatamente al 911 o vaya a la sala de emergencias.  Nmeros de bper  - Dr. Nehemiah Massed: [phone]  - Dra. Moye: [phone]  - Dra. Nicole Kindred: [phone]  En caso de inclemencias del [place], por favor llame a Johnsie Kindred principal al [phone] para una actualizacin sobre el [place] de cualquier retraso o cierre.  Consejos para la medicacin en dermatologa: Por favor, guarde las cajas en las que vienen los  medicamentos de uso tpico para ayudarle a seguir las instrucciones sobre dnde y cmo usarlos. Las farmacias generalmente imprimen las instrucciones del medicamento slo en las cajas y no directamente en los tubos del [place].   Si su medicamento es muy caro, por favor, pngase en contacto con Zigmund Daniel llamando al [phone] y presione la opcin 4 o envenos un mensaje a travs de Pharmacist, community.   No podemos decirle cul ser su copago por los medicamentos por adelantado ya que esto es diferente dependiendo de la cobertura de su seguro. Sin embargo, es posible que podamos encontrar un medicamento sustituto a Electrical engineer un formulario para que el seguro cubra el medicamento que se considera necesario.   Si se requiere una autorizacin previa para que su compaa de seguros [place] su medicamento, por favor permtanos de 1 a 2 das hbiles para completar este proceso.  Los precios de los medicamentos varan con frecuencia dependiendo del Environmental consultant de dnde se surte la receta y alguna farmacias pueden ofrecer precios ms baratos.  El sitio web www.goodrx.com tiene cupones para medicamentos de Airline pilot. Los precios aqu no tienen en cuenta lo que podra costar con la ayuda del seguro (puede ser ms barato con su seguro), pero el sitio web puede darle el precio si no utiliz Research scientist (physical sciences).  - Puede imprimir el cupn correspondiente y llevarlo con su receta a la farmacia.  - Tambin puede pasar por nuestra oficina durante el horario de atencin regular y Charity fundraiser una tarjeta de cupones de GoodRx.  - Si necesita que su receta se enve electrnicamente a una farmacia diferente, informe a nuestra oficina a travs de MyChart de [place] o por telfono llamando al [phone] y presione la opcin 4.

## 2022-04-03 ENCOUNTER — Encounter: Payer: Self-pay | Admitting: Dermatology

## 2022-04-03 DIAGNOSIS — M25512 Pain in left shoulder: Secondary | ICD-10-CM | POA: Diagnosis not present

## 2022-04-03 DIAGNOSIS — M25511 Pain in right shoulder: Secondary | ICD-10-CM | POA: Diagnosis not present

## 2022-04-03 NOTE — Progress Notes (Signed)
I tried to reach the patient regarding low  b 12 levels.  Unable to reach the patient /unable to leave a voicemail.  Heather,/ Seth Bake X Please follow up in the morning regarding b12 injections.

## 2022-04-04 ENCOUNTER — Encounter: Payer: Self-pay | Admitting: Internal Medicine

## 2022-04-04 DIAGNOSIS — E538 Deficiency of other specified B group vitamins: Secondary | ICD-10-CM | POA: Insufficient documentation

## 2022-04-05 ENCOUNTER — Telehealth: Payer: Self-pay | Admitting: Internal Medicine

## 2022-04-05 ENCOUNTER — Telehealth: Payer: Self-pay

## 2022-04-05 DIAGNOSIS — E538 Deficiency of other specified B group vitamins: Secondary | ICD-10-CM

## 2022-04-05 NOTE — Telephone Encounter (Signed)
Message from Dr. Jacinto Reap:  B12 level low; recommend monthly B12 injections.  I tried to reach the patient unable to reach her; unable to leave voicemail.  Staff will reach out to the patient.   Patient informed and agrees with plan.  Please contact pt to schedule monthly B12 inj including next MD visit.

## 2022-04-05 NOTE — Telephone Encounter (Signed)
S/w debbie - stated was following up on medication request placed 7/19. Has been away for 2 weeks and didn't know if pt had started medications. I advised Debbie due to allergy to sulfasalazine, we are going to hold on diclofenac gel. Also advised [name] that pt has already started using new nasal spray - I had asked pt if it was working better at her most recent visit 7/26 and she stated she felt it was.

## 2022-04-05 NOTE — Telephone Encounter (Signed)
See Telephone note from 03/20/2022, unable to attach this note.  Debbie called and stated her mother never received medications. She would like a call back.

## 2022-04-08 ENCOUNTER — Inpatient Hospital Stay: Payer: Medicare HMO

## 2022-04-08 DIAGNOSIS — D539 Nutritional anemia, unspecified: Secondary | ICD-10-CM | POA: Diagnosis not present

## 2022-04-08 DIAGNOSIS — R5383 Other fatigue: Secondary | ICD-10-CM | POA: Diagnosis not present

## 2022-04-08 DIAGNOSIS — I272 Pulmonary hypertension, unspecified: Secondary | ICD-10-CM | POA: Diagnosis not present

## 2022-04-08 DIAGNOSIS — D500 Iron deficiency anemia secondary to blood loss (chronic): Secondary | ICD-10-CM

## 2022-04-08 DIAGNOSIS — I110 Hypertensive heart disease with heart failure: Secondary | ICD-10-CM | POA: Diagnosis not present

## 2022-04-08 DIAGNOSIS — R21 Rash and other nonspecific skin eruption: Secondary | ICD-10-CM | POA: Diagnosis not present

## 2022-04-08 DIAGNOSIS — M549 Dorsalgia, unspecified: Secondary | ICD-10-CM | POA: Diagnosis not present

## 2022-04-08 DIAGNOSIS — M2550 Pain in unspecified joint: Secondary | ICD-10-CM | POA: Diagnosis not present

## 2022-04-08 DIAGNOSIS — I509 Heart failure, unspecified: Secondary | ICD-10-CM | POA: Diagnosis not present

## 2022-04-08 DIAGNOSIS — D696 Thrombocytopenia, unspecified: Secondary | ICD-10-CM | POA: Diagnosis not present

## 2022-04-08 MED ORDER — CYANOCOBALAMIN 1000 MCG/ML IJ SOLN
1000.0000 ug | Freq: Once | INTRAMUSCULAR | Status: AC
  Administered 2022-04-08: 1000 ug via INTRAMUSCULAR
  Filled 2022-04-08: qty 1

## 2022-04-09 ENCOUNTER — Ambulatory Visit: Payer: Medicare HMO | Admitting: Dermatology

## 2022-04-10 ENCOUNTER — Telehealth: Payer: Self-pay | Admitting: Internal Medicine

## 2022-04-10 ENCOUNTER — Ambulatory Visit
Admission: RE | Admit: 2022-04-10 | Discharge: 2022-04-10 | Disposition: A | Discharge status: Home / Self Care | Payer: Medicare HMO | Source: Ambulatory Visit | Attending: Orthopedic Surgery | Admitting: Orthopedic Surgery

## 2022-04-10 ENCOUNTER — Other Ambulatory Visit: Payer: Self-pay | Admitting: Orthopedic Surgery

## 2022-04-10 DIAGNOSIS — R2242 Localized swelling, mass and lump, left lower limb: Secondary | ICD-10-CM | POA: Insufficient documentation

## 2022-04-10 DIAGNOSIS — M7989 Other specified soft tissue disorders: Secondary | ICD-10-CM | POA: Diagnosis not present

## 2022-04-10 NOTE — Telephone Encounter (Signed)
Lm for bonnie to cb

## 2022-04-10 NOTE — Telephone Encounter (Signed)
Patient states her physical therapist called earlier today and she is following-up on that call.  Patient states her physical therapist is concerned that there may be a blood clot in her left leg.  I spoke with Denita Lung, CMA, and transferred call to her.

## 2022-04-10 NOTE — Telephone Encounter (Signed)
Bonnie from [employer] called stating pt has been having injections in her arms last week and Horris Latino is concerned about her Left lower extremities that are a 10 out of a 10 that is even when she is at rest also. [phone]

## 2022-04-10 NOTE — Telephone Encounter (Signed)
Pt called in stating that [facility] is going to schedule an ultrasound of pt leg... Pt stated disregard below note.Marland KitchenMarland KitchenMarland Kitchen

## 2022-04-12 ENCOUNTER — Encounter: Payer: Self-pay | Admitting: Internal Medicine

## 2022-04-13 ENCOUNTER — Encounter: Payer: Self-pay | Admitting: Internal Medicine

## 2022-04-13 DIAGNOSIS — D649 Anemia, unspecified: Secondary | ICD-10-CM | POA: Insufficient documentation

## 2022-04-15 ENCOUNTER — Ambulatory Visit
Admission: EM | Admit: 2022-04-15 | Discharge: 2022-04-15 | Disposition: A | Discharge status: Home / Self Care | Payer: Medicare HMO | Attending: Family Medicine | Admitting: Family Medicine

## 2022-04-15 ENCOUNTER — Other Ambulatory Visit: Payer: Self-pay

## 2022-04-15 ENCOUNTER — Encounter: Payer: Self-pay | Admitting: *Deleted

## 2022-04-15 ENCOUNTER — Telehealth: Payer: Self-pay | Admitting: Internal Medicine

## 2022-04-15 DIAGNOSIS — L509 Urticaria, unspecified: Secondary | ICD-10-CM | POA: Diagnosis not present

## 2022-04-15 DIAGNOSIS — L259 Unspecified contact dermatitis, unspecified cause: Secondary | ICD-10-CM

## 2022-04-15 DIAGNOSIS — M5416 Radiculopathy, lumbar region: Secondary | ICD-10-CM | POA: Diagnosis not present

## 2022-04-15 DIAGNOSIS — L03115 Cellulitis of right lower limb: Secondary | ICD-10-CM | POA: Diagnosis not present

## 2022-04-15 DIAGNOSIS — Z79899 Other long term (current) drug therapy: Secondary | ICD-10-CM | POA: Diagnosis not present

## 2022-04-15 DIAGNOSIS — L03116 Cellulitis of left lower limb: Secondary | ICD-10-CM

## 2022-04-15 DIAGNOSIS — M519 Unspecified thoracic, thoracolumbar and lumbosacral intervertebral disc disorder: Secondary | ICD-10-CM | POA: Diagnosis not present

## 2022-04-15 DIAGNOSIS — M19012 Primary osteoarthritis, left shoulder: Secondary | ICD-10-CM | POA: Diagnosis not present

## 2022-04-15 DIAGNOSIS — S76312A Strain of muscle, fascia and tendon of the posterior muscle group at thigh level, left thigh, initial encounter: Secondary | ICD-10-CM | POA: Diagnosis not present

## 2022-04-15 DIAGNOSIS — M19011 Primary osteoarthritis, right shoulder: Secondary | ICD-10-CM | POA: Diagnosis not present

## 2022-04-15 DIAGNOSIS — M48061 Spinal stenosis, lumbar region without neurogenic claudication: Secondary | ICD-10-CM | POA: Diagnosis not present

## 2022-04-15 DIAGNOSIS — K5903 Drug induced constipation: Secondary | ICD-10-CM | POA: Diagnosis not present

## 2022-04-15 DIAGNOSIS — Q675 Congenital deformity of spine: Secondary | ICD-10-CM | POA: Diagnosis not present

## 2022-04-15 MED ORDER — DEXAMETHASONE SODIUM PHOSPHATE 10 MG/ML IJ SOLN
5.0000 mg | Freq: Once | INTRAMUSCULAR | Status: AC
  Administered 2022-04-15: 5 mg via INTRAMUSCULAR

## 2022-04-15 MED ORDER — DIPHENHYDRAMINE HCL 50 MG/ML IJ SOLN
12.5000 mg | Freq: Once | INTRAMUSCULAR | Status: AC
Start: 2022-04-15 — End: 2022-04-15
  Administered 2022-04-15: 12.5 mg via INTRAMUSCULAR

## 2022-04-15 MED ORDER — CETIRIZINE HCL 10 MG PO TABS: 10.0000 mg | ORAL_TABLET | Freq: Every day | ORAL | 0 refills | Status: DC

## 2022-04-15 MED ORDER — DEXAMETHASONE SODIUM PHOSPHATE 10 MG/ML IJ SOLN: 5.0000 mg | Freq: Once | INTRAMUSCULAR | Status: DC

## 2022-04-15 MED ORDER — DOXYCYCLINE HYCLATE 100 MG PO CAPS: 100.0000 mg | ORAL_CAPSULE | Freq: Two times a day (BID) | ORAL | 0 refills | Status: DC

## 2022-04-15 MED ORDER — DOXYCYCLINE HYCLATE 100 MG PO CAPS: 100.0000 mg | ORAL_CAPSULE | Freq: Two times a day (BID) | ORAL | 0 refills | Status: AC

## 2022-04-15 NOTE — ED Provider Notes (Signed)
Kimberly Roy    CSN: [id number] Arrival date & time: 04/15/22  1505      History   Chief Complaint Chief Complaint  Patient presents with  . Rash    HPI Kimberly Roy is a 88 y.o. female.   HPI Patient with a history of recurrent  Past Medical History:  Diagnosis Date  . Anemia   . Anxiety   . Chest pain   . CHF (congestive heart failure) ([place])   . Constipation   . DDD (degenerative disc disease), cervical   . Depression   . DVT (deep venous thrombosis) ([place])   . Dysphonia   . Dyspnea   . Fatty liver   . Fatty liver   . Headache   . Hyperlipidemia   . Hyperpiesia   . Hypertension   . Hypothyroidism   . Interstitial cystitis   . Left ventricular dysfunction   . Lymphedema   . Nephrolithiasis   . Obstructive sleep apnea   . Osteoarthritis    knees/cervical and lumbar spine  . Pulmonary hypertension ([place])   . Pulmonary nodules    followed by Dr Raul Del  . Pure hypercholesterolemia   . Renal cyst    right    Patient Active Problem List   Diagnosis Date Noted  . Anemia 04/13/2022  . B12 deficiency 04/04/2022  . Interstitial cystitis 03/28/2022  . Iron deficiency 02/28/2022  . Right foot pain 01/29/2022  . SOB (shortness of breath) 10/14/2021  . Rash 03/03/2021  . Skin lesion 03/03/2021  . Aortic atherosclerosis ([place]) 11/20/2020  . Bradycardia 06/08/2020  . Itching 04/08/2020  . Dyspnea 01/17/2020  . Chronic respiratory failure with hypoxia ([place]) 01/17/2020  . Right hip pain 01/10/2020  . Leukocytosis 11/15/2019  . Wound of buttock 10/25/2019  . COVID-19 virus infection 10/04/2019  . Urinary frequency 09/26/2019  . Cold feeling 09/12/2019  . Left knee pain 07/04/2019  . Hemoptysis 06/30/2019  . Femur fracture ([place]) 08/06/2018  . Primary osteoarthritis of right shoulder 08/15/2017  . Right shoulder pain 05/16/2017  . Chronic venous insufficiency 02/10/2017  . Lymphedema 02/10/2017  . Neuropathy 12/25/2016  . Anemia due to blood loss,  chronic 08/07/2016  . GERD (gastroesophageal reflux disease) 12/10/2015  . Finger pain 11/13/2015  . Carotid artery calcification 09/26/2015  . External nasal lesion 09/25/2015  . Muscle cramps 09/01/2015  . Excessive sweating 07/30/2015  . Headache 07/16/2015  . Groin pain 05/14/2015  . Muscle twitching 03/05/2015  . Leg cramps 02/11/2015  . Back pain 02/11/2015  . Abdominal pain 02/11/2015  . Chronic pain 01/19/2015  . Acute cystitis without hematuria 12/12/2014  . Left elbow pain 11/30/2014  . Health care maintenance 11/30/2014  . Osteoporosis 10/19/2014  . Rectal bleeding 10/19/2014  . Neck pain 09/03/2014  . Unsteady gait 09/03/2014  . Nocturia 09/03/2014  . Dysphagia 06/01/2014  . Nasal dryness 06/01/2014  . Stress 06/01/2014  . Fatigue 02/27/2014  . Degenerative disc disease 12/21/2013  . Pre-op evaluation 10/12/2013  . Hoarseness 08/14/2013  . Leg swelling 01/24/2013  . CHF (congestive heart failure) ([place]) 12/29/2012  . Cough 12/29/2012  . OSA (obstructive sleep apnea) 07/09/2012  . Osteoarthritis 07/04/2012  . Symptomatic anemia 07/04/2012  . Chronic constipation 07/04/2012  . Pulmonary hypertension ([place]) 07/04/2012  . Pulmonary nodules 07/04/2012  . Hypertension 07/04/2012  . Hyperlipidemia 07/04/2012  . Hypothyroidism 07/04/2012    Past Surgical History:  Procedure Laterality Date  . ABDOMINAL HYSTERECTOMY     ovaries left in  place  . APPENDECTOMY    . Back Surgeries    . BACK SURGERY    . BREAST REDUCTION SURGERY     3/99  . CARDIAC CATHETERIZATION    . cataracts Bilateral   . CERVICAL SPINE SURGERY    . ESOPHAGEAL MANOMETRY N/A 08/02/2015   Procedure: ESOPHAGEAL MANOMETRY (EM);  Surgeon: Josefine Class, MD;  Location: [facility] ENDOSCOPY;  Service: Endoscopy;  Laterality: N/A;  . ESOPHAGOGASTRODUODENOSCOPY N/A 02/27/2015   Procedure: ESOPHAGOGASTRODUODENOSCOPY (EGD);  Surgeon: Hulen Luster, MD;  [facility] ENDOSCOPY;  Service: Gastroenterology;   Laterality: N/A;  . ESOPHAGOGASTRODUODENOSCOPY (EGD) WITH PROPOFOL N/A 10/30/2018   Procedure: ESOPHAGOGASTRODUODENOSCOPY (EGD) WITH PROPOFOL;  Surgeon: Lollie Sails, MD;  Location: [facility] ENDOSCOPY;  Service: Endoscopy;  Laterality: N/A;  . EXCISIONAL HEMORRHOIDECTOMY    . EYE SURGERY    . FRACTURE SURGERY    . HEMORRHOID SURGERY    . HIP SURGERY  2013   Right hip surgery  . JOINT REPLACEMENT    . KNEE ARTHROSCOPY     left and right  . ORIF FEMUR FRACTURE Right 08/07/2018   Procedure: OPEN REDUCTION INTERNAL FIXATION (ORIF) DISTAL FEMUR FRACTURE;  Surgeon: Hessie Knows, MD;  Location: [facility] ORS;  Service: Orthopedics;  Laterality: Right;  . REDUCTION MAMMAPLASTY Bilateral YRS AGO  . REPLACEMENT TOTAL KNEE Bilateral   . rotator cuff surgery     blilateral  . TONSILECTOMY/ADENOIDECTOMY WITH MYRINGOTOMY    . VISCERAL ARTERY INTERVENTION N/A 02/25/2019   Procedure: VISCERAL ARTERY INTERVENTION;  Surgeon: Algernon Huxley, MD;  Location: [facility] CV LAB;  Service: Cardiovascular;  Laterality: N/A;    OB History   No obstetric history on file.      Home Medications    Prior to Admission medications   Medication Sig Start Date End Date Taking? Authorizing Provider  albuterol (VENTOLIN HFA) 108 (90 Base) MCG/ACT inhaler Inhale 2 puffs into the lungs every 6 (six) hours as needed for wheezing or shortness of breath. 01/30/22   Einar Pheasant, MD  aluminum-magnesium hydroxide-simethicone (MAALOX) 200-200-20 MG/5ML SUSP Take 15 mLs by mouth 4 (four) times daily -  before meals and at bedtime.     [provider]  amLODipine (NORVASC) 10 MG tablet TAKE 1 TABLET BY MOUTH DAILY 11/16/21   Einar Pheasant, MD  aspirin 81 MG EC tablet Take by mouth.    [provider]  atorvastatin (LIPITOR) 20 MG tablet TAKE 1 TABLET BY MOUTH AT BEDTIME 03/06/22   Einar Pheasant, MD  benazepril (LOTENSIN) 40 MG tablet TAKE 1 TABLET BY MOUTH DAILY 11/16/21   Einar Pheasant, MD  betamethasone  dipropionate 0.05 % cream Apply 1 application topically 2 (two) times daily. 10/19/20   [provider]  busPIRone (BUSPAR) 10 MG tablet Take 10 mg by mouth 2 (two) times daily.  03/31/18   [provider]  calcium citrate-vitamin D (CITRACAL+D) 315-200 MG-UNIT tablet Take 1 tablet by mouth daily.    [provider]  Cholecalciferol (VITAMIN D3) 1000 UNITS CAPS Take 1 capsule by mouth daily.    [provider]  clobetasol cream (TEMOVATE) 0.99 % Apply 1 application topically See admin instructions. Apply to affected areas of body 1 - 2 times daily as needed for itchy bumps. Avoid applying to face, groin, and axilla. Use as directed. 07/30/21   Brendolyn Patty, MD  Diclofenac Sodium 3 % GEL Apply topically 2 (two) times daily. 12/20/19   [provider]  Docusate Sodium (DSS) 100 MG  CAPS Take by mouth.    [provider]  DULoxetine (CYMBALTA) 60 MG capsule Take 60 mg by mouth daily. 02/20/22   [provider]  ELMIRON 100 MG capsule Take 100 mg by mouth 3 (three) times daily. 11/07/20   [provider]  Fluocinolone Acetonide 0.01 % OIL Apply 1-2 drops into ears once to twice daily as needed. 10/09/21   Brendolyn Patty, MD  fluocinonide (LIDEX) 0.05 % external solution Apply topically 2 (two) times daily. Apply drops to scalp for itch 07/30/21   Brendolyn Patty, MD  furosemide (LASIX) 20 MG tablet TAKE 1 TABLET BY MOUTH DAILY AS NEEDED 02/20/22   Einar Pheasant, MD  gabapentin (NEURONTIN) 300 MG capsule Take two tablets tid (in am, midday and q hs). 08/02/21   Einar Pheasant, MD  HYDROcodone-acetaminophen (NORCO) 10-325 MG tablet Take 1 tablet by mouth 3 (three) times daily. 08/10/18   Fritzi Mandes, MD  [facility]) OINT Apply topically as directed (as discussed). 03/27/22   Einar Pheasant, MD  ipratropium (ATROVENT) 0.03 % nasal spray Place 2 sprays into both nostrils every 12 (twelve) hours. 03/20/22   Einar Pheasant, MD   ketoconazole (NIZORAL) 2 % shampoo Apply 1 application topically See admin instructions. apply three times per week, massage into scalp and leave in for 10 minutes before rinsing out 07/30/21   Brendolyn Patty, MD  LINZESS 145 MCG CAPS capsule Take 145 mcg by mouth daily. 10/22/21   [provider]  magnesium oxide (MAG-OX) 400 MG tablet Take 1 tablet (400 mg total) by mouth daily. 09/22/20   Einar Pheasant, MD  Melatonin 10 MG CAPS Take 1 capsule by mouth.    [provider]  meloxicam (MOBIC) 15 MG tablet Take 15 mg by mouth daily. 02/22/22   [provider]  methocarbamol (ROBAXIN) 500 MG tablet Take 500 mg by mouth 3 (three) times daily. As needed 03/06/21   [provider]  metoprolol succinate (TOPROL-XL) 50 MG 24 hr tablet TAKE 1 TABLET BY MOUTH DAILY. TAKE WITH OR IMMEDIATELY FOLLOWING A MEAL. 11/16/21   Einar Pheasant, MD  montelukast (SINGULAIR) 10 MG tablet 10 mg once daily as needed 12/23/19   [provider]  morphine (MS CONTIN) 60 MG 12 hr tablet Take 1 tablet (60 mg total) by mouth 2 (two) times daily. 08/10/18   Fritzi Mandes, MD  mupirocin ointment (BACTROBAN) 2 % Apply 1 application topically 2 (two) times daily. 05/14/21   Einar Pheasant, MD  mupirocin ointment (BACTROBAN) 2 % Apply 1 application topically daily. Apply to left knee until healed 07/30/21   Brendolyn Patty, MD  Naldemedine Tosylate 0.2 MG TABS Take 0.2 mg by mouth daily.    [provider]  nystatin (MYCOSTATIN/NYSTOP) powder Apply 1 application. topically 2 (two) times daily as needed. 01/21/22   Einar Pheasant, MD  polyethylene glycol powder (GLYCOLAX/MIRALAX) 17 GM/SCOOP powder MIX 17 GRAMS AS MARKED ON BOTTLE TOP IN 8 OUNCES OF WATER AND DRINK ONCE A DAY AS DIRECTED. 02/05/19   Einar Pheasant, MD  predniSONE (DELTASONE) 20 MG tablet Take 1 tablet (20 mg total) by mouth daily with breakfast. 03/19/22   Hughie Closs, PA-C  RABEprazole (ACIPHEX) 20 MG tablet TAKE 1  TABLET BY MOUTH TWICE A DAY BEFORE A MEAL 11/16/21   Einar Pheasant, MD  senna (SENOKOT) 8.6 MG TABS tablet Take 1 tablet by mouth 2 (two) times daily.    [provider]  Simethicone (GAS-X PO) Take 2 tablets by  mouth 2 (two) times daily.    [provider]  sodium chloride (OCEAN) 0.65 % nasal spray Place 1 spray into the nose as needed.    [provider]  SYNTHROID 100 MCG tablet Take 1 tablet (100 mcg total) by mouth daily. 03/06/22   Einar Pheasant, MD  triamcinolone ointment (KENALOG) 0.1 % APPLY TWICE DAILY TO BITES AND RASH UNTIL FLAT AND SMOOTH **DO NOT APPLY TO FACE** 07/16/21   Einar Pheasant, MD    Family History Family History  Problem Relation Age of Onset  . Heart disease Mother   . Stroke Mother   . Hypertension Mother   . Heart disease Father        myocardial infarction age 74  . Breast cancer Neg Hx     Social History Social History   Tobacco Use  . Smoking status: Never  . Smokeless tobacco: Never  Vaping Use  . Vaping Use: Never used  Substance Use Topics  . Alcohol use: No    Alcohol/week: 0.0 standard drinks of alcohol  . Drug use: Never     Allergies   Lyrica [pregabalin], Omnicef [cefdinir], Atarax [hydroxyzine], Dicyclomine, Hydroxyzine hcl, Levaquin [levofloxacin], Nitrofurantoin, Nucynta er [tapentadol hcl er], Oxybutynin, Zoloft [sertraline hcl], Biaxin [clarithromycin], Meloxicam, Sertraline, Sulfa antibiotics, Sulfasalazine, Tape, Tapentadol, and Venofer [iron sucrose]   Review of Systems Review of Systems   Physical Exam Triage Vital Signs ED Triage Vitals  Enc Vitals Group     BP 04/15/22 1653 (!) 183/71     Pulse Rate 04/15/22 1653 72     Resp 04/15/22 1653 18     Temp 04/15/22 1653 98.2 F (36.8 C)     Temp Source 04/15/22 1653 Oral     SpO2 04/15/22 1653 96 %     Weight --      Height --      Head Circumference --      Peak Flow --      Pain Score 04/15/22 1701 2     Pain Loc --      Pain Edu?  --      Excl. in [place]? --    No data found.  Updated Vital Signs BP (!) 183/71 (BP Location: Left Arm)   Pulse 72   Temp 98.2 F (36.8 C) (Oral)   Resp 18   SpO2 96%   Visual Acuity Right Eye Distance:   Left Eye Distance:   Bilateral Distance:    Right Eye Near:   Left Eye Near:    Bilateral Near:     Physical Exam   UC Treatments / Results  Labs (all labs ordered are listed, but only abnormal results are displayed) Labs Reviewed - No data to display  EKG   Radiology No results found.  Procedures Procedures (including critical care time)  Medications Ordered in UC Medications - No data to display  Initial Impression / Assessment and Plan / UC Course  I have reviewed the triage vital signs and the nursing notes.  Pertinent labs & imaging results that were available during my care of the patient were reviewed by me and considered in my medical decision making (see chart for details).     *** Final Clinical Impressions(s) / UC Diagnoses   Final diagnoses:  None   Discharge Instructions   None    ED Prescriptions   None    PDMP not reviewed this encounter.

## 2022-04-15 NOTE — Telephone Encounter (Signed)
Pt advised urgent care per my conversation with Dr Nicki Reaper

## 2022-04-15 NOTE — Discharge Instructions (Addendum)
Continue Claritin.  Start cetirizine  (Zyrtec) 10 mg at bedtime.  Start with half of a pill for the first dose then increase to 10 mg at bedtime as long as patient is able to tolerate medication without experiencing any severe dizziness or confusion. Continue topical creams. Update PCP with medication changes,

## 2022-04-15 NOTE — Telephone Encounter (Signed)
Patient called and stated she has a rash all over her body, it is itchy, hurts a little. The itch is driving her crazy. She has not been outside, she had someone come and spray the inside of the house and she still itches. She wanted to know if Dr Nicki Reaper could give her a steroid shot.

## 2022-04-15 NOTE — ED Triage Notes (Signed)
PT reports a rash that is worse since SAt. Rash covers her back,legs and is in scalp.

## 2022-04-17 ENCOUNTER — Telehealth: Payer: Self-pay | Admitting: Internal Medicine

## 2022-04-17 NOTE — Telephone Encounter (Signed)
Patient called and wanted to know if office had the name of the company she uses for her oxygen and the phone number. [place] office unable to locate that information.

## 2022-04-24 ENCOUNTER — Encounter: Payer: Medicare HMO | Admitting: Dermatology

## 2022-04-25 DIAGNOSIS — F419 Anxiety disorder, unspecified: Secondary | ICD-10-CM | POA: Diagnosis not present

## 2022-04-25 DIAGNOSIS — F330 Major depressive disorder, recurrent, mild: Secondary | ICD-10-CM | POA: Diagnosis not present

## 2022-04-25 DIAGNOSIS — F5105 Insomnia due to other mental disorder: Secondary | ICD-10-CM | POA: Diagnosis not present

## 2022-04-30 ENCOUNTER — Other Ambulatory Visit: Payer: Medicare HMO

## 2022-04-30 ENCOUNTER — Ambulatory Visit: Payer: Medicare HMO

## 2022-04-30 ENCOUNTER — Ambulatory Visit: Payer: Medicare HMO | Admitting: Internal Medicine

## 2022-05-02 ENCOUNTER — Ambulatory Visit: Payer: Medicare HMO | Admitting: Dermatology

## 2022-05-07 ENCOUNTER — Inpatient Hospital Stay: Payer: Medicare HMO

## 2022-05-08 ENCOUNTER — Inpatient Hospital Stay: Payer: Medicare HMO | Attending: Internal Medicine

## 2022-05-08 DIAGNOSIS — E538 Deficiency of other specified B group vitamins: Secondary | ICD-10-CM | POA: Diagnosis not present

## 2022-05-08 DIAGNOSIS — Z79899 Other long term (current) drug therapy: Secondary | ICD-10-CM | POA: Insufficient documentation

## 2022-05-08 DIAGNOSIS — D539 Nutritional anemia, unspecified: Secondary | ICD-10-CM | POA: Diagnosis not present

## 2022-05-08 DIAGNOSIS — D500 Iron deficiency anemia secondary to blood loss (chronic): Secondary | ICD-10-CM

## 2022-05-08 MED ORDER — EPOETIN ALFA-EPBX 20000 UNIT/ML IJ SOLN: 20000.0000 [IU] | Freq: Once | INTRAMUSCULAR | Status: DC

## 2022-05-08 MED ORDER — CYANOCOBALAMIN 1000 MCG/ML IJ SOLN
1000.0000 ug | Freq: Once | INTRAMUSCULAR | Status: AC
  Administered 2022-05-08: 1000 ug via INTRAMUSCULAR
  Filled 2022-05-08: qty 1

## 2022-05-16 ENCOUNTER — Telehealth: Payer: Self-pay

## 2022-05-16 NOTE — Telephone Encounter (Signed)
Patient states she would like to request a refill for gabapentin (NEURONTIN) 300 MG capsule.  Patient states she would like to pick this medication up on Saturday (05/18/2022).  *Patient states her preferred pharmacy is [facility].

## 2022-05-17 MED ORDER — GABAPENTIN 300 MG PO CAPS: ORAL_CAPSULE | ORAL | 2 refills | Status: DC

## 2022-05-17 MED ORDER — GABAPENTIN 300 MG PO CAPS
ORAL_CAPSULE | ORAL | 2 refills | Status: DC
Start: 2022-05-17 — End: 2022-05-20

## 2022-05-17 NOTE — Telephone Encounter (Signed)
Rx sent in for gabapentin.  [facility], but [facility].  [facility]. [facility]

## 2022-05-17 NOTE — Telephone Encounter (Signed)
Rx ok'd for gabapentin.

## 2022-05-20 ENCOUNTER — Telehealth: Payer: Self-pay

## 2022-05-20 MED ORDER — GABAPENTIN 300 MG PO CAPS: ORAL_CAPSULE | ORAL | 1 refills | Status: DC

## 2022-05-20 NOTE — Telephone Encounter (Signed)
Rx sent in for gabapentin two tid #540 with one refill to [facility].  Pt aware.

## 2022-05-20 NOTE — Telephone Encounter (Signed)
Called [facility] and got rx cancelled but pt stated she wanted a 90 day supply only 180 pills were sent in with 2 refills pt states she takes 2 in the am and 2 in the evening she would need 360 pills is it okay to order that amt ?

## 2022-05-20 NOTE — Telephone Encounter (Signed)
Duplicate.  See other phone message.  Spoke to pt.  Clarified she is taking gabapentin 2 tid.  #540 with one refill sent to [facility].

## 2022-05-20 NOTE — Telephone Encounter (Signed)
Patient states we sent her refill for gabapentin (NEURONTIN) 300 MG capsule to [facility] on [address], but it was supposed to go to [facility].  Patient states the refill was for 30-day supply, but she would like to have a 90-day supply.

## 2022-05-22 ENCOUNTER — Other Ambulatory Visit: Payer: Self-pay | Admitting: Internal Medicine

## 2022-05-29 ENCOUNTER — Encounter: Payer: Self-pay | Admitting: Internal Medicine

## 2022-05-29 ENCOUNTER — Ambulatory Visit (INDEPENDENT_AMBULATORY_CARE_PROVIDER_SITE_OTHER): Payer: Medicare HMO | Admitting: Internal Medicine

## 2022-05-29 DIAGNOSIS — E033 Postinfectious hypothyroidism: Secondary | ICD-10-CM | POA: Diagnosis not present

## 2022-05-29 DIAGNOSIS — G629 Polyneuropathy, unspecified: Secondary | ICD-10-CM | POA: Diagnosis not present

## 2022-05-29 DIAGNOSIS — Z23 Encounter for immunization: Secondary | ICD-10-CM | POA: Diagnosis not present

## 2022-05-29 DIAGNOSIS — I509 Heart failure, unspecified: Secondary | ICD-10-CM

## 2022-05-29 DIAGNOSIS — K219 Gastro-esophageal reflux disease without esophagitis: Secondary | ICD-10-CM | POA: Diagnosis not present

## 2022-05-29 DIAGNOSIS — E785 Hyperlipidemia, unspecified: Secondary | ICD-10-CM

## 2022-05-29 DIAGNOSIS — I10 Essential (primary) hypertension: Secondary | ICD-10-CM | POA: Diagnosis not present

## 2022-05-29 DIAGNOSIS — I872 Venous insufficiency (chronic) (peripheral): Secondary | ICD-10-CM

## 2022-05-29 DIAGNOSIS — I700 Atherosclerosis of aorta: Secondary | ICD-10-CM | POA: Diagnosis not present

## 2022-05-29 DIAGNOSIS — M19011 Primary osteoarthritis, right shoulder: Secondary | ICD-10-CM

## 2022-05-29 DIAGNOSIS — J9611 Chronic respiratory failure with hypoxia: Secondary | ICD-10-CM

## 2022-05-29 DIAGNOSIS — D649 Anemia, unspecified: Secondary | ICD-10-CM

## 2022-05-29 DIAGNOSIS — F439 Reaction to severe stress, unspecified: Secondary | ICD-10-CM

## 2022-05-29 DIAGNOSIS — I272 Pulmonary hypertension, unspecified: Secondary | ICD-10-CM

## 2022-05-29 NOTE — Progress Notes (Signed)
Patient ID: Kimberly Roy, female   DOB: [birth year], 88 y.o.   MRN: [id number]   Subjective:    Patient ID: Kimberly Roy, female    DOB: 07-22-1931, 88 y.o.   MRN: [id number]   Patient here for  Chief Complaint  Patient presents with   Follow-up    2 month follow up   .   HPI Here to follow up regarding her blood pressure and lower extremity swelling.  She is accompanied by her caretaker.  History obtained from both of them.  Reports she is doing relatively well.  Still with increased neck/shoulder pain.  Seeing ortho.  No chest pain.  Breathing stable.  No abdominal pain.  Bowels moving.  Persistent irritation - right ear.  Intermittent over the past year.  Discussed low dose steroid.  Call with update.  Persistent swelling lower extremities.  Overall improved.  Wears compression hose.     Past Medical History:  Diagnosis Date   Anemia    Anxiety    Chest pain    CHF (congestive heart failure) (HCC)    Constipation    DDD (degenerative disc disease), cervical    Depression    DVT (deep venous thrombosis) (HCC)    Dysphonia    Dyspnea    Fatty liver    Fatty liver    Headache    Hyperlipidemia    Hyperpiesia    Hypertension    Hypothyroidism    Interstitial cystitis    Left ventricular dysfunction    Lymphedema    Nephrolithiasis    Obstructive sleep apnea    Osteoarthritis    knees/cervical and lumbar spine   Pulmonary hypertension (HCC)    Pulmonary nodules    followed by Dr Raul Del   Pure hypercholesterolemia    Renal cyst    right   Past Surgical History:  Procedure Laterality Date   ABDOMINAL HYSTERECTOMY     ovaries left in place   APPENDECTOMY     Back Surgeries     BACK SURGERY     BREAST REDUCTION SURGERY     3/99   CARDIAC CATHETERIZATION     cataracts Bilateral    CERVICAL SPINE SURGERY     ESOPHAGEAL MANOMETRY N/A 08/02/2015   Procedure: ESOPHAGEAL MANOMETRY (EM);  Surgeon: Josefine Class, MD;  Location: [facility] ENDOSCOPY;  Service: Endoscopy;   Laterality: N/A;   ESOPHAGOGASTRODUODENOSCOPY N/A 02/27/2015   Procedure: ESOPHAGOGASTRODUODENOSCOPY (EGD);  Surgeon: Hulen Luster, MD;  Location: [facility] ENDOSCOPY;  Service: Gastroenterology;  Laterality: N/A;   ESOPHAGOGASTRODUODENOSCOPY (EGD) WITH PROPOFOL N/A 10/30/2018   Procedure: ESOPHAGOGASTRODUODENOSCOPY (EGD) WITH PROPOFOL;  Surgeon: Lollie Sails, MD;  Location: [facility] ENDOSCOPY;  Service: Endoscopy;  Laterality: N/A;   EXCISIONAL HEMORRHOIDECTOMY     EYE SURGERY     FRACTURE SURGERY     HEMORRHOID SURGERY     HIP SURGERY  2013   Right hip surgery   JOINT REPLACEMENT     KNEE ARTHROSCOPY     left and right   ORIF FEMUR FRACTURE Right 08/07/2018   Procedure: OPEN REDUCTION INTERNAL FIXATION (ORIF) DISTAL FEMUR FRACTURE;  Surgeon: Hessie Knows, MD;  Location: [facility] ORS;  Service: Orthopedics;  Laterality: Right;   REDUCTION MAMMAPLASTY Bilateral YRS AGO   REPLACEMENT TOTAL KNEE Bilateral    rotator cuff surgery     blilateral   TONSILECTOMY/ADENOIDECTOMY WITH MYRINGOTOMY     VISCERAL ARTERY INTERVENTION N/A 02/25/2019   Procedure: VISCERAL ARTERY INTERVENTION;  Surgeon: Algernon Huxley,  MD;  Location: [facility];  Service: Cardiovascular;  Laterality: N/A;   Family History  Problem Relation Age of Onset   Heart disease Mother    Stroke Mother    Hypertension Mother    Heart disease Father        myocardial infarction age 57   Breast cancer Neg Hx    Social History   Socioeconomic History   Marital status: Widowed    Spouse name: Not on file   Number of children: 2   Years of education: 5   Highest education level: 12th grade  Occupational History    Comment: insurance agency  Tobacco Use   Smoking status: Never   Smokeless tobacco: Never  Vaping Use   Vaping Use: Never used  Substance and Sexual Activity   Alcohol use: No    Alcohol/week: 0.0 standard drinks of alcohol   Drug use: Never   Sexual activity: Not Currently  Other Topics Concern   Not on  file  Social History Narrative   Not on file   Social Determinants of Health   Financial Resource Strain: Low Risk  (03/09/2021)   Overall Financial Resource Strain (CARDIA)    Difficulty of Paying Living Expenses: Not hard at all  Food Insecurity: No Food Insecurity (11/15/2021)   Hunger Vital Sign    Worried About Running Out of Food in the Last Year: Never true    [place] in the Last Year: Never true  Transportation Needs: Unmet Transportation Needs (11/30/2021)   PRAPARE - Transportation    Lack of Transportation (Medical): Yes    Lack of Transportation (Non-Medical): Yes  Physical Activity: Inactive (03/09/2021)   Exercise Vital Sign    Days of Exercise per Week: 0 days    Minutes of Exercise per Session: 0 min  Stress: No Stress Concern Present (03/09/2021)   [place]    Feeling of Stress : Only a little  Social Connections: Socially Isolated (11/15/2021)   Social Connection and Isolation Panel [NHANES]    Frequency of Communication with Friends and Family: Three times a week    Frequency of Social Gatherings with Friends and Family: Twice a week    Attends Religious Services: Never    Marine scientist or Organizations: No    Attends Archivist Meetings: Never    Marital Status: Widowed     Review of Systems  Constitutional:  Negative for appetite change and unexpected weight change.  HENT:  Negative for congestion and sinus pressure.   Respiratory:  Negative for cough and chest tightness.        Breathing stable.   Cardiovascular:  Negative for chest pain and palpitations.       No increased leg swelling.   Gastrointestinal:  Negative for abdominal pain, diarrhea, nausea and vomiting.  Genitourinary:  Negative for difficulty urinating and dysuria.  Musculoskeletal:  Negative for myalgias.       Neck/shoulder and arm pain as outlined.  Followed by ortho.   Skin:  Negative for color  change and rash.  Neurological:  Negative for dizziness, light-headedness and headaches.  Psychiatric/Behavioral:  Negative for agitation and dysphoric mood.        Objective:     BP (!) 126/58 (BP Location: Left Arm, Patient Position: Sitting, Cuff Size: Normal)   Pulse 88   Temp 97.9 F (36.6 C) (Oral)   Ht 5' 2" (1.575 m)  Wt 176 lb 6.4 oz (80 kg)   SpO2 95%   BMI 32.26 kg/m  Wt Readings from Last 3 Encounters:  05/29/22 176 lb 6.4 oz (80 kg)  04/02/22 173 lb 12.8 oz (78.8 kg)  03/27/22 175 lb 4.8 oz (79.5 kg)    Physical Exam Vitals reviewed.  Constitutional:      General: She is not in acute distress.    Appearance: Normal appearance.  HENT:     Head: Normocephalic and atraumatic.     Right Ear: External ear normal.     Left Ear: External ear normal.  Eyes:     General: No scleral icterus.       Right eye: No discharge.        Left eye: No discharge.     Conjunctiva/sclera: Conjunctivae normal.  Neck:     Thyroid: No thyromegaly.  Cardiovascular:     Rate and Rhythm: Normal rate and regular rhythm.  Pulmonary:     Effort: No respiratory distress.     Breath sounds: Normal breath sounds. No wheezing.  Abdominal:     General: Bowel sounds are normal.     Palpations: Abdomen is soft.     Tenderness: There is no abdominal tenderness.  Musculoskeletal:        General: No swelling.     Cervical back: Neck supple. No tenderness.     Comments: Limited rom - left arm/shoulder  Lymphadenopathy:     Cervical: No cervical adenopathy.  Skin:    Findings: No erythema or rash.  Neurological:     Mental Status: She is alert.  Psychiatric:        Mood and Affect: Mood normal.        Behavior: Behavior normal.      Outpatient Encounter Medications as of 05/29/2022  Medication Sig   albuterol (VENTOLIN HFA) 108 (90 Base) MCG/ACT inhaler Inhale 2 puffs into the lungs every 6 (six) hours as needed for wheezing or shortness of breath.   aluminum-magnesium  hydroxide-simethicone (MAALOX) 573-220-25 MG/5ML SUSP Take 15 mLs by mouth 4 (four) times daily -  before meals and at bedtime.    amLODipine (NORVASC) 10 MG tablet TAKE 1 TABLET BY MOUTH DAILY   aspirin 81 MG EC tablet Take by mouth.   atorvastatin (LIPITOR) 20 MG tablet TAKE 1 TABLET BY MOUTH AT BEDTIME   benazepril (LOTENSIN) 40 MG tablet TAKE 1 TABLET BY MOUTH DAILY   betamethasone dipropionate 0.05 % cream Apply 1 application topically 2 (two) times daily.   busPIRone (BUSPAR) 10 MG tablet Take 10 mg by mouth 2 (two) times daily.    calcium citrate-vitamin D (CITRACAL+D) 315-200 MG-UNIT tablet Take 1 tablet by mouth daily.   cetirizine (ZYRTEC) 10 MG tablet Take 1 tablet (10 mg total) by mouth daily.   Cholecalciferol (VITAMIN D3) 1000 UNITS CAPS Take 1 capsule by mouth daily.   clobetasol cream (TEMOVATE) 4.27 % Apply 1 application topically See admin instructions. Apply to affected areas of body 1 - 2 times daily as needed for itchy bumps. Avoid applying to face, groin, and axilla. Use as directed.   Diclofenac Sodium 3 % GEL Apply topically 2 (two) times daily.   Docusate Sodium (DSS) 100 MG CAPS Take by mouth.   DULoxetine (CYMBALTA) 60 MG capsule Take 60 mg by mouth daily.   ELMIRON 100 MG capsule Take 100 mg by mouth 3 (three) times daily.   Fluocinolone Acetonide 0.01 % OIL Apply 1-2 drops into ears once  to twice daily as needed.   fluocinonide (LIDEX) 0.05 % external solution Apply topically 2 (two) times daily. Apply drops to scalp for itch   furosemide (LASIX) 20 MG tablet TAKE 1 TABLET BY MOUTH DAILY AS NEEDED   gabapentin (NEURONTIN) 300 MG capsule Take two tablets tid (in am, midday and q hs).   HYDROcodone-acetaminophen (NORCO) 10-325 MG tablet Take 1 tablet by mouth 3 (three) times daily.   Infant Care Products [facility]) OINT Apply topically as directed (as discussed).   ipratropium (ATROVENT) 0.03 % nasal spray Place 2 sprays into both nostrils every 12 (twelve) hours.    ketoconazole (NIZORAL) 2 % shampoo Apply 1 application topically See admin instructions. apply three times per week, massage into scalp and leave in for 10 minutes before rinsing out   LINZESS 145 MCG CAPS capsule Take 145 mcg by mouth daily.   magnesium oxide (MAG-OX) 400 MG tablet Take 1 tablet (400 mg total) by mouth daily.   meloxicam (MOBIC) 15 MG tablet Take 15 mg by mouth daily.   methocarbamol (ROBAXIN) 500 MG tablet Take 500 mg by mouth 3 (three) times daily. As needed   metoprolol succinate (TOPROL-XL) 50 MG 24 hr tablet TAKE 1 TABLET BY MOUTH DAILY. TAKE WITH OR IMMEDIATELY FOLLOWING A MEAL.   montelukast (SINGULAIR) 10 MG tablet 10 mg once daily as needed   morphine (MS CONTIN) 60 MG 12 hr tablet Take 1 tablet (60 mg total) by mouth 2 (two) times daily.   mupirocin ointment (BACTROBAN) 2 % Apply 1 application topically 2 (two) times daily.   mupirocin ointment (BACTROBAN) 2 % Apply 1 application topically daily. Apply to left knee until healed   Naldemedine Tosylate 0.2 MG TABS Take 0.2 mg by mouth daily.   nystatin (MYCOSTATIN/NYSTOP) powder Apply 1 application. topically 2 (two) times daily as needed.   polyethylene glycol powder (GLYCOLAX/MIRALAX) 17 GM/SCOOP powder MIX 17 GRAMS AS MARKED ON BOTTLE TOP IN 8 OUNCES OF WATER AND DRINK ONCE A DAY AS DIRECTED.   RABEprazole (ACIPHEX) 20 MG tablet TAKE 1 TABLET BY MOUTH TWICE A DAY BEFORE A MEAL   senna (SENOKOT) 8.6 MG TABS tablet Take 1 tablet by mouth 2 (two) times daily.   Simethicone (GAS-X PO) Take 2 tablets by mouth 2 (two) times daily.   sodium chloride (OCEAN) 0.65 % nasal spray Place 1 spray into the nose as needed.   SYNTHROID 100 MCG tablet Take 1 tablet (100 mcg total) by mouth daily.   triamcinolone ointment (KENALOG) 0.1 % APPLY TWICE DAILY TO BITES AND RASH UNTIL FLAT AND SMOOTH **DO NOT APPLY TO FACE**   [DISCONTINUED] Melatonin 10 MG CAPS Take 1 capsule by mouth. (Patient not taking: Reported on 05/29/2022)    [DISCONTINUED] predniSONE (DELTASONE) 20 MG tablet Take 1 tablet (20 mg total) by mouth daily with breakfast. (Patient not taking: Reported on 05/29/2022)   No facility-administered encounter medications on file as of 05/29/2022.     Lab Results  Component Value Date   WBC 9.7 04/01/2022   HGB 10.4 (L) 04/01/2022   HCT 32.3 (L) 04/01/2022   PLT 137 (L) 04/01/2022   GLUCOSE 126 (H) 04/01/2022   CHOL 143 01/21/2022   TRIG 108.0 01/21/2022   HDL 57.90 01/21/2022   LDLCALC 63 01/21/2022   ALT 13 01/21/2022   AST 15 01/21/2022   NA 139 04/01/2022   K 3.8 04/01/2022   CL 102 04/01/2022   CREATININE 0.94 04/01/2022   BUN 18 04/01/2022  CO2 31 04/01/2022   TSH 4.63 01/21/2022   INR 0.89 08/06/2018   HGBA1C 6.0 10/19/2014       Assessment & Plan:   Problem List Items Addressed This Visit     Anemia    Saw Dr Rogue Bussing 04/12/22 - previous rash to IV venofer.  Oral iron daily. Needs B12 injections.       Aortic atherosclerosis (HCC)    Continue lipitor.       CHF (congestive heart failure) (HCC)    On lasix.  Lower extremity swelling improved.  No evidence of volume overload.  Follow.       Chronic respiratory failure with hypoxia (HCC)    Using oxygen at night.  Breathing baseline.       Chronic venous insufficiency    Continue compression hose.  Swelling overall improved.       GERD (gastroesophageal reflux disease)    Continue aciphex.  Followed by GI.       Hyperlipidemia    On lipitor.  Low cholesterol diet and exercise.  Follow lipid panel and liver function tests.        Hypertension    Blood pressure as outlined.  Continue current medication - metoprolol, benazepril and amlodipine.  Follow metabolic panel.       Hypothyroidism    On thyroid replacement.  Follow tsh.       Neuropathy    Continues on gabapentin.  Follow.       Primary osteoarthritis of right shoulder    Limited rom  Pain.  Followed by ortho.  Plans to f/u.       Pulmonary  hypertension ([place])    Followed by pulmonary.  Continue nighttime oxygen.       Stress    Followed by psychiatry.  Appears to be stable.  Follow.       Other Visit Diagnoses     Need for immunization against influenza       Relevant Orders   Flu Vaccine QUAD High Dose(Fluad) (Completed)        Einar Pheasant, MD

## 2022-06-02 ENCOUNTER — Encounter: Payer: Self-pay | Admitting: Internal Medicine

## 2022-06-02 NOTE — Assessment & Plan Note (Signed)
Using oxygen at night.  Breathing baseline.

## 2022-06-02 NOTE — Assessment & Plan Note (Signed)
Limited rom  Pain.  Followed by ortho.  Plans to f/u.

## 2022-06-02 NOTE — Assessment & Plan Note (Signed)
On lasix.  Lower extremity swelling improved.  No evidence of volume overload.  Follow.

## 2022-06-02 NOTE — Assessment & Plan Note (Signed)
Continues on gabapentin.  Follow.

## 2022-06-02 NOTE — Assessment & Plan Note (Signed)
Continue lipitor.

## 2022-06-02 NOTE — Assessment & Plan Note (Signed)
On lipitor.  Low cholesterol diet and exercise.  Follow lipid panel and liver function tests.   

## 2022-06-02 NOTE — Assessment & Plan Note (Signed)
On thyroid replacement.  Follow tsh.  

## 2022-06-02 NOTE — Assessment & Plan Note (Signed)
Blood pressure as outlined.  Continue current medication - metoprolol, benazepril and amlodipine.  Follow metabolic panel.  

## 2022-06-02 NOTE — Assessment & Plan Note (Signed)
Saw Dr Rogue Bussing 04/12/22 - previous rash to IV venofer.  Oral iron daily. Needs B12 injections.

## 2022-06-02 NOTE — Assessment & Plan Note (Signed)
Continue compression hose.  Swelling overall improved.  

## 2022-06-02 NOTE — Assessment & Plan Note (Signed)
Followed by pulmonary.  Continue nighttime oxygen.

## 2022-06-02 NOTE — Assessment & Plan Note (Signed)
Continue aciphex.  Followed by GI.

## 2022-06-02 NOTE — Assessment & Plan Note (Signed)
Followed by psychiatry.  Appears to be stable.  Follow.  °

## 2022-06-03 ENCOUNTER — Ambulatory Visit: Payer: Medicare HMO

## 2022-06-03 ENCOUNTER — Ambulatory Visit: Payer: Medicare HMO | Admitting: Internal Medicine

## 2022-06-03 ENCOUNTER — Other Ambulatory Visit: Payer: Medicare HMO

## 2022-06-04 ENCOUNTER — Inpatient Hospital Stay: Payer: Medicare HMO | Attending: Internal Medicine | Admitting: Internal Medicine

## 2022-06-04 ENCOUNTER — Inpatient Hospital Stay: Payer: Medicare HMO

## 2022-06-04 ENCOUNTER — Encounter: Payer: Self-pay | Admitting: Internal Medicine

## 2022-06-04 DIAGNOSIS — I272 Pulmonary hypertension, unspecified: Secondary | ICD-10-CM | POA: Insufficient documentation

## 2022-06-04 DIAGNOSIS — D649 Anemia, unspecified: Secondary | ICD-10-CM

## 2022-06-04 DIAGNOSIS — Z882 Allergy status to sulfonamides status: Secondary | ICD-10-CM | POA: Diagnosis not present

## 2022-06-04 DIAGNOSIS — Z8249 Family history of ischemic heart disease and other diseases of the circulatory system: Secondary | ICD-10-CM | POA: Diagnosis not present

## 2022-06-04 DIAGNOSIS — Z79890 Hormone replacement therapy (postmenopausal): Secondary | ICD-10-CM | POA: Insufficient documentation

## 2022-06-04 DIAGNOSIS — R5383 Other fatigue: Secondary | ICD-10-CM | POA: Diagnosis not present

## 2022-06-04 DIAGNOSIS — M2550 Pain in unspecified joint: Secondary | ICD-10-CM | POA: Diagnosis not present

## 2022-06-04 DIAGNOSIS — Z881 Allergy status to other antibiotic agents status: Secondary | ICD-10-CM | POA: Diagnosis not present

## 2022-06-04 DIAGNOSIS — Z9049 Acquired absence of other specified parts of digestive tract: Secondary | ICD-10-CM | POA: Insufficient documentation

## 2022-06-04 DIAGNOSIS — M1990 Unspecified osteoarthritis, unspecified site: Secondary | ICD-10-CM | POA: Diagnosis not present

## 2022-06-04 DIAGNOSIS — Z79899 Other long term (current) drug therapy: Secondary | ICD-10-CM | POA: Insufficient documentation

## 2022-06-04 DIAGNOSIS — Z888 Allergy status to other drugs, medicaments and biological substances status: Secondary | ICD-10-CM | POA: Insufficient documentation

## 2022-06-04 DIAGNOSIS — Z86718 Personal history of other venous thrombosis and embolism: Secondary | ICD-10-CM | POA: Insufficient documentation

## 2022-06-04 DIAGNOSIS — I509 Heart failure, unspecified: Secondary | ICD-10-CM | POA: Diagnosis not present

## 2022-06-04 DIAGNOSIS — I110 Hypertensive heart disease with heart failure: Secondary | ICD-10-CM | POA: Insufficient documentation

## 2022-06-04 DIAGNOSIS — Z823 Family history of stroke: Secondary | ICD-10-CM | POA: Diagnosis not present

## 2022-06-04 DIAGNOSIS — D500 Iron deficiency anemia secondary to blood loss (chronic): Secondary | ICD-10-CM

## 2022-06-04 DIAGNOSIS — Z8719 Personal history of other diseases of the digestive system: Secondary | ICD-10-CM | POA: Diagnosis not present

## 2022-06-04 DIAGNOSIS — D539 Nutritional anemia, unspecified: Secondary | ICD-10-CM | POA: Diagnosis not present

## 2022-06-04 DIAGNOSIS — E538 Deficiency of other specified B group vitamins: Secondary | ICD-10-CM | POA: Insufficient documentation

## 2022-06-04 DIAGNOSIS — M549 Dorsalgia, unspecified: Secondary | ICD-10-CM | POA: Diagnosis not present

## 2022-06-04 DIAGNOSIS — E039 Hypothyroidism, unspecified: Secondary | ICD-10-CM | POA: Diagnosis not present

## 2022-06-04 LAB — CBC WITH DIFFERENTIAL/PLATELET
Abs Immature Granulocytes: 0.01 10*3/uL (ref 0.00–0.07)
Basophils Absolute: 0 10*3/uL (ref 0.0–0.1)
Basophils Relative: 0 %
Eosinophils Absolute: 0.3 10*3/uL (ref 0.0–0.5)
Eosinophils Relative: 5 %
HCT: 32.6 % — ABNORMAL LOW (ref 36.0–46.0)
Hemoglobin: 10.5 g/dL — ABNORMAL LOW (ref 12.0–15.0)
Immature Granulocytes: 0 %
Lymphocytes Relative: 31 %
Lymphs Abs: 2 10*3/uL (ref 0.7–4.0)
MCH: 32.9 pg (ref 26.0–34.0)
MCHC: 32.2 g/dL (ref 30.0–36.0)
MCV: 102.2 fL — ABNORMAL HIGH (ref 80.0–100.0)
Monocytes Absolute: 0.5 10*3/uL (ref 0.1–1.0)
Monocytes Relative: 8 %
Neutro Abs: 3.6 10*3/uL (ref 1.7–7.7)
Neutrophils Relative %: 56 %
Platelets: 143 10*3/uL — ABNORMAL LOW (ref 150–400)
RBC: 3.19 MIL/uL — ABNORMAL LOW (ref 3.87–5.11)
RDW: 13.4 % (ref 11.5–15.5)
WBC: 6.5 10*3/uL (ref 4.0–10.5)
nRBC: 0 % (ref 0.0–0.2)

## 2022-06-04 LAB — BASIC METABOLIC PANEL
Anion gap: 5 (ref 5–15)
BUN: 19 mg/dL (ref 8–23)
CO2: 30 mmol/L (ref 22–32)
Calcium: 9.2 mg/dL (ref 8.9–10.3)
Chloride: 101 mmol/L (ref 98–111)
Creatinine, Ser: 0.79 mg/dL (ref 0.44–1.00)
GFR, Estimated: >60 mL/min (ref >60)
Glucose, Bld: 106 mg/dL — ABNORMAL HIGH (ref 70–99)
Potassium: 4.3 mmol/L (ref 3.5–5.1)
Sodium: 136 mmol/L (ref 135–145)

## 2022-06-04 LAB — VITAMIN B12: Vitamin B-12: 325 pg/mL (ref 180–914)

## 2022-06-04 MED ORDER — CYANOCOBALAMIN 1000 MCG/ML IJ SOLN
1000.0000 ug | Freq: Once | INTRAMUSCULAR | Status: AC
  Administered 2022-06-04: 1000 ug via INTRAMUSCULAR
  Filled 2022-06-04: qty 1

## 2022-06-04 MED ORDER — CYANOCOBALAMIN 1000 MCG/ML IJ SOLN: 1000.0000 ug | INTRAMUSCULAR | 0 refills | Status: DC

## 2022-06-04 MED ORDER — "BD SAFETYGLIDE SYRINGE/NEEDLE 25G X 1"" 3 ML MISC": 1 refills | Status: DC

## 2022-06-04 NOTE — Progress Notes (Signed)
Patient here today for follow up regarding anemia. Patient denies concerns today.

## 2022-06-04 NOTE — Progress Notes (Signed)
[place] NOTE  Patient Care Team: Einar Pheasant, [name], [name], [name], [name], MD as Consulting Physician (Hematology and Oncology)  CHIEF COMPLAINTS/PURPOSE OF CONSULTATION: Anemia  # CHRONIC MILD ANEMIA hb ~11.[Previous Dr.Gittin pt]; [place] 2017- work up [place] [employer]; stool card; Iron/ monoclonal work up- NEGATIVE] ; MARCH 2022- B12 WNL. BONE MARROW BIOPSY-JUNE 2023- DIAGNOSIS:   BONE MARROW, ASPIRATE, CLOT, CORE:  -Overall normocellular bone marrow with orderly trilineage  hematopoiesis, see note.  -  Essentially absent histiocytic iron stores   PERIPHERAL BLOOD:  -Macrocytic anemia and thrombocytopenia.   NOTE:  While the biopsy is inadequate for interpretation the overall aspirate  and clot section are adequate and revealed an otherwise normocellular  bone marrow.  While there is orderly maturation and unremarkable  morphology to the myeloid and erythroid lineages a compensatory  erythroid hyperplasia with left shift is not definitively identified.  There is a mild megakaryocytic hyperplasia with some atypical forms;  however, this may simply be a compensatory megakaryocytic hyperplasia  with left shift.  There is no definitive dysplasia in any of the 3  lineages.  Given the borderline plasmacytosis by aspirate morphology  immunohistochemical stains were performed identifying a normal number of  polyclonal plasma cells.   It should be noted that while my indices did anemia is macrocytic there  is essentially absent histiocytic iron stores.  A morphologic etiology  for the patient's anemia and thrombocytopenia is not identified.  Correlation with pending conventional cytogenetics is recommended to  fully exclude a possible myelodysplastic syndrome.  If clinically  indicated additional molecular  testing such as a next generation myeloid  panel could be considered.   # CHRONIC INTERSTITIAL CYSTITIS/ wheel chair bound  Oncology History   No history exists.    HISTORY OF PRESENTING ILLNESS: Alone.  In wheel chair.  Patient's daughter [name] on the phone.  Kimberly Roy 88 y.o.  female  with a history of chronic anemia- > 10 years and mild thrombocytopenia-possible iron deficiency is here for follow-up.   In the interim diagnosed with B12 deficiency.  Patient status post B12 injections x2.  Patient here today for follow up regarding anemia. Patient denies concerns today.  Patient currently on gentle iron.  Denies any worsening constipation.  Complains of ongoing fatigue.  This is not any worse.  Review of Systems  Constitutional:  Positive for malaise/fatigue. Negative for chills, diaphoresis and fever.  HENT:  Negative for nosebleeds and sore throat.   Eyes:  Negative for double vision.  Respiratory:  Negative for cough, hemoptysis, sputum production, shortness of breath and wheezing.   Cardiovascular:  Negative for chest pain, palpitations, orthopnea and leg swelling.  Gastrointestinal:  Negative for abdominal pain, blood in stool, constipation, diarrhea, heartburn, melena, nausea and vomiting.  Genitourinary:  Negative for dysuria, frequency and urgency.  Musculoskeletal:  Positive for back pain and joint pain.  Skin: Negative.  Negative for itching and rash.  Neurological:  Negative for dizziness, tingling, focal weakness, weakness and headaches.  Endo/Heme/Allergies:  Does not bruise/bleed easily.  Psychiatric/Behavioral:  Negative for depression. The patient is not nervous/anxious and does not have insomnia.      MEDICAL HISTORY:  Past Medical History:  Diagnosis Date   Anemia    Anxiety    Chest pain    CHF (congestive heart failure) (HCC)    Constipation  DDD (degenerative disc disease), cervical    Depression    DVT (deep venous thrombosis) (HCC)     Dysphonia    Dyspnea    Fatty liver    Fatty liver    Headache    Hyperlipidemia    Hyperpiesia    Hypertension    Hypothyroidism    Interstitial cystitis    Left ventricular dysfunction    Lymphedema    Nephrolithiasis    Obstructive sleep apnea    Osteoarthritis    knees/cervical and lumbar spine   Pulmonary hypertension (HCC)    Pulmonary nodules    followed by Dr Raul Del   Pure hypercholesterolemia    Renal cyst    right    SURGICAL HISTORY: Past Surgical History:  Procedure Laterality Date   ABDOMINAL HYSTERECTOMY     ovaries left in place   APPENDECTOMY     Back Surgeries     BACK SURGERY     BREAST REDUCTION SURGERY     3/99   CARDIAC CATHETERIZATION     cataracts Bilateral    CERVICAL SPINE SURGERY     ESOPHAGEAL MANOMETRY N/A 08/02/2015   Procedure: ESOPHAGEAL MANOMETRY (EM);  Surgeon: Josefine Class, MD;  Location: [facility] ENDOSCOPY;  Service: Endoscopy;  Laterality: N/A;   ESOPHAGOGASTRODUODENOSCOPY N/A 02/27/2015   Procedure: ESOPHAGOGASTRODUODENOSCOPY (EGD);  Surgeon: Hulen Luster, MD;  Location[facility] ENDOSCOPY;  Service: Gastroenterology;  Laterality: N/A;   ESOPHAGOGASTRODUODENOSCOPY (EGD) WITH PROPOFOL N/A 10/30/2018   Procedure: ESOPHAGOGASTRODUODENOSCOPY (EGD) WITH PROPOFOL;  Surgeon: Lollie Sails, MD;  Location: [facility] ENDOSCOPY;  Service: Endoscopy;  Laterality: N/A;   EXCISIONAL HEMORRHOIDECTOMY     EYE SURGERY     FRACTURE SURGERY     HEMORRHOID SURGERY     HIP SURGERY  2013   Right hip surgery   JOINT REPLACEMENT     KNEE ARTHROSCOPY     left and right   ORIF FEMUR FRACTURE Right 08/07/2018   Procedure: OPEN REDUCTION INTERNAL FIXATION (ORIF) DISTAL FEMUR FRACTURE;  Surgeon: Hessie Knows, MD;  Location: [facility] ORS;  Service: Orthopedics;  Laterality: Right;   REDUCTION MAMMAPLASTY Bilateral YRS AGO   REPLACEMENT TOTAL KNEE Bilateral    rotator cuff surgery     blilateral   TONSILECTOMY/ADENOIDECTOMY WITH MYRINGOTOMY     VISCERAL ARTERY  INTERVENTION N/A 02/25/2019   Procedure: VISCERAL ARTERY INTERVENTION;  Surgeon: Algernon Huxley, MD;  Location: [place] [facility] LAB;  Service: Cardiovascular;  Laterality: N/A;    SOCIAL HISTORY: Social History   Socioeconomic History   Marital status: Widowed    Spouse name: Not on file   Number of children: 2   Years of education: 64   Highest education level: 12th grade  Occupational History    Comment: insurance agency  Tobacco Use   Smoking status: Never   Smokeless tobacco: Never  Vaping Use   Vaping Use: Never used  Substance and Sexual Activity   Alcohol use: No    Alcohol/week: 0.0 standard drinks of alcohol   Drug use: Never   Sexual activity: Not Currently  Other Topics Concern   Not on file  Social History Narrative   Not on file   Social Determinants of Health   Financial Resource Strain: Low Risk  (03/09/2021)   Overall Financial Resource Strain (CARDIA)    Difficulty of Paying Living Expenses: Not hard at all  Food Insecurity: No Food Insecurity (11/15/2021)   Hunger Vital Sign    Worried About Running Out of  Food in the Last Year: Never true    [place] in the Last Year: Never true  Transportation Needs: Unmet Transportation Needs (11/30/2021)   PRAPARE - Transportation    Lack of Transportation (Medical): Yes    Lack of Transportation (Non-Medical): Yes  Physical Activity: Inactive (03/09/2021)   Exercise Vital Sign    Days of Exercise per Week: 0 days    Minutes of Exercise per Session: 0 min  Stress: No Stress Concern Present (03/09/2021)   [place]    Feeling of Stress : Only a little  Social Connections: Socially Isolated (11/15/2021)   Social Connection and Isolation Panel [NHANES]    Frequency of Communication with Friends and Family: Three times a week    Frequency of Social Gatherings with Friends and Family: Twice a week    Attends Religious Services: Never    Museum/gallery conservator or Organizations: No    Attends Archivist Meetings: Never    Marital Status: Widowed  Intimate Partner Violence: Not At Risk (11/15/2021)   Humiliation, Afraid, Rape, and Kick questionnaire    Fear of Current or Ex-Partner: No    Emotionally Abused: No    Physically Abused: No    Sexually Abused: No    FAMILY HISTORY: Family History  Problem Relation Age of Onset   Heart disease Mother    Stroke Mother    Hypertension Mother    Heart disease Father        myocardial infarction age 50   Breast cancer Neg Hx     ALLERGIES:  is allergic to lyrica [pregabalin], omnicef [cefdinir], atarax [hydroxyzine], dicyclomine, hydroxyzine hcl, levaquin [levofloxacin], nitrofurantoin, nucynta er [tapentadol hcl er], oxybutynin, zoloft [sertraline hcl], biaxin [clarithromycin], meloxicam, sertraline, sulfa antibiotics, sulfasalazine, tape, tapentadol, and venofer [iron sucrose].  MEDICATIONS:  Current Outpatient Medications  Medication Sig Dispense Refill   albuterol (VENTOLIN HFA) 108 (90 Base) MCG/ACT inhaler Inhale 2 puffs into the lungs every 6 (six) hours as needed for wheezing or shortness of breath. 18 g 0   aluminum-magnesium hydroxide-simethicone (MAALOX) 656-812-75 MG/5ML SUSP Take 15 mLs by mouth 4 (four) times daily -  before meals and at bedtime.      amLODipine (NORVASC) 10 MG tablet TAKE 1 TABLET BY MOUTH DAILY 90 tablet 1   aspirin 81 MG EC tablet Take by mouth.     atorvastatin (LIPITOR) 20 MG tablet TAKE 1 TABLET BY MOUTH AT BEDTIME 90 tablet 1   benazepril (LOTENSIN) 40 MG tablet TAKE 1 TABLET BY MOUTH DAILY 90 tablet 1   betamethasone dipropionate 0.05 % cream Apply 1 application topically 2 (two) times daily.     busPIRone (BUSPAR) 10 MG tablet Take 10 mg by mouth 2 (two) times daily.      calcium citrate-vitamin D (CITRACAL+D) 315-200 MG-UNIT tablet Take 1 tablet by mouth daily.     cetirizine (ZYRTEC) 10 MG tablet Take 1 tablet (10 mg total) by mouth daily.  30 tablet 0   Cholecalciferol (VITAMIN D3) 1000 UNITS CAPS Take 1 capsule by mouth daily.     clobetasol cream (TEMOVATE) 1.70 % Apply 1 application topically See admin instructions. Apply to affected areas of body 1 - 2 times daily as needed for itchy bumps. Avoid applying to face, groin, and axilla. Use as directed. 60 g 1   cyanocobalamin (VITAMIN B12) 1000 MCG/ML injection Inject 1 mL (1,000 mcg total) into the muscle every 3 (three)  months. 12 mL 0   Diclofenac Sodium 3 % GEL Apply topically 2 (two) times daily.     Docusate Sodium (DSS) 100 MG CAPS Take by mouth.     DULoxetine (CYMBALTA) 60 MG capsule Take 60 mg by mouth daily.     ELMIRON 100 MG capsule Take 100 mg by mouth 3 (three) times daily.     Fluocinolone Acetonide 0.01 % OIL Apply 1-2 drops into ears once to twice daily as needed. 20 mL 3   fluocinonide (LIDEX) 0.05 % external solution Apply topically 2 (two) times daily. Apply drops to scalp for itch 60 mL 3   furosemide (LASIX) 20 MG tablet TAKE 1 TABLET BY MOUTH DAILY AS NEEDED 90 tablet 0   gabapentin (NEURONTIN) 300 MG capsule Take two tablets tid (in am, midday and q hs). 540 capsule 1   HYDROcodone-acetaminophen (NORCO) 10-325 MG tablet Take 1 tablet by mouth 3 (three) times daily. 10 tablet 0   Infant Care Products [facility]) OINT Apply topically as directed (as discussed). 430 g 1   ipratropium (ATROVENT) 0.03 % nasal spray Place 2 sprays into both nostrils every 12 (twelve) hours. 30 mL 1   ketoconazole (NIZORAL) 2 % shampoo Apply 1 application topically See admin instructions. apply three times per week, massage into scalp and leave in for 10 minutes before rinsing out 120 mL 3   LINZESS 145 MCG CAPS capsule Take 145 mcg by mouth daily.     magnesium oxide (MAG-OX) 400 MG tablet Take 1 tablet (400 mg total) by mouth daily. 30 tablet 1   meloxicam (MOBIC) 15 MG tablet Take 15 mg by mouth daily.     methocarbamol (ROBAXIN) 500 MG tablet Take 500 mg by mouth 3 (three)  times daily. As needed     metoprolol succinate (TOPROL-XL) 50 MG 24 hr tablet TAKE 1 TABLET BY MOUTH DAILY. TAKE WITH OR IMMEDIATELY FOLLOWING A MEAL. 90 tablet 1   montelukast (SINGULAIR) 10 MG tablet 10 mg once daily as needed     morphine (MS CONTIN) 60 MG 12 hr tablet Take 1 tablet (60 mg total) by mouth 2 (two) times daily. 10 tablet 0   mupirocin ointment (BACTROBAN) 2 % Apply 1 application topically 2 (two) times daily. 22 g 0   mupirocin ointment (BACTROBAN) 2 % Apply 1 application topically daily. Apply to left knee until healed 22 g 0   Naldemedine Tosylate 0.2 MG TABS Take 0.2 mg by mouth daily.     nystatin (MYCOSTATIN/NYSTOP) powder Apply 1 application. topically 2 (two) times daily as needed. 60 g 0   polyethylene glycol powder (GLYCOLAX/MIRALAX) 17 GM/SCOOP powder MIX 17 GRAMS AS MARKED ON BOTTLE TOP IN 8 OUNCES OF WATER AND DRINK ONCE A DAY AS DIRECTED. 527 g 0   RABEprazole (ACIPHEX) 20 MG tablet TAKE 1 TABLET BY MOUTH TWICE A DAY BEFORE A MEAL 180 tablet 1   senna (SENOKOT) 8.6 MG TABS tablet Take 1 tablet by mouth 2 (two) times daily.     Simethicone (GAS-X PO) Take 2 tablets by mouth 2 (two) times daily.     sodium chloride (OCEAN) 0.65 % nasal spray Place 1 spray into the nose as needed.     SYNTHROID 100 MCG tablet Take 1 tablet (100 mcg total) by mouth daily. 90 tablet 3   SYRINGE-NEEDLE, DISP, 3 ML (BD SAFETYGLIDE SYRINGE/NEEDLE) 25G X 1" 3 ML MISC Use the needle for IM injection once every 3 months. 10 each 1  triamcinolone ointment (KENALOG) 0.1 % APPLY TWICE DAILY TO BITES AND RASH UNTIL FLAT AND SMOOTH **DO NOT APPLY TO FACE** 80 g 0   No current facility-administered medications for this visit.      Marland Kitchen  PHYSICAL EXAMINATION: ECOG PERFORMANCE STATUS: 0 - Asymptomatic  Vitals:   06/04/22 1443  BP: 139/61  Pulse: (!) 54  Resp: 18  Temp: 98.1 F (36.7 C)  SpO2: 95%   Filed Weights   06/04/22 1443  Weight: 175 lb (79.4 kg)     Physical  Exam Constitutional:      Comments: Patient in wheelchair because of chronic arthritic problems.  HENT:     Head: Normocephalic and atraumatic.     Mouth/Throat:     Pharynx: No oropharyngeal exudate.  Eyes:     Pupils: Pupils are equal, round, and reactive to light.  Cardiovascular:     Rate and Rhythm: Normal rate and regular rhythm.  Pulmonary:     Effort: Pulmonary effort is normal. No respiratory distress.     Breath sounds: Normal breath sounds. No wheezing.  Abdominal:     General: Bowel sounds are normal. There is no distension.     Palpations: Abdomen is soft. There is no mass.     Tenderness: There is no abdominal tenderness. There is no guarding or rebound.  Musculoskeletal:        General: No tenderness. Normal range of motion.     Cervical back: Normal range of motion and neck supple.  Skin:    General: Skin is warm.  Neurological:     Mental Status: She is alert and oriented to person, place, and time.  Psychiatric:        Mood and Affect: Affect normal.     LABORATORY DATA:  I have reviewed the data as listed Lab Results  Component Value Date   WBC 6.5 06/04/2022   HGB 10.5 (L) 06/04/2022   HCT 32.6 (L) 06/04/2022   MCV 102.2 (H) 06/04/2022   PLT 143 (L) 06/04/2022   Recent Labs    06/27/21 1236 09/26/21 1317 01/21/22 1200 01/23/22 1256 02/28/22 1116 04/01/22 1345 06/04/22 1418  NA 137   < > 140   < > 138 139 136  K 4.4   < > 4.7   < > 3.9 3.8 4.3  CL 98   < > 101   < > 98 102 101  CO2 33*   < > 33*   < > _0 GLUCOSE 76   < > 84   < > 124* 126* 106*  BUN 24*   < > 23   < > _1 CREATININE 0.93   < > 1.02   < > 1.00 0.94 0.79  CALCIUM 9.3   < > 10.0   < > 9.0 9.7 9.2  GFRNONAA 59*   < >  --    < > 54* 58* >60  PROT 6.5  --  6.4  --   --   --   --   ALBUMIN 4.0  --  4.3  --   --   --   --   AST 20  --  15  --   --   --   --   ALT 15  --  13  --   --   --   --   ALKPHOS 72  --  96  --   --   --   --  BILITOT 0.7  --  0.4  --   --    --   --   BILIDIR  --   --  0.1  --   --   --   --    < > = values in this interval not displayed.    RADIOGRAPHIC STUDIES: I have personally reviewed the radiological images as listed and agreed with the findings in the report. No results found.  ASSESSMENT & PLAN:   Symptomatic anemia #  Chronic macrocytic anemia-unclear etiology-hemoglobin around 10-mild thrombocytopenia greater than 100 platelets- suspect multifactorial. BONE MARROW- June 2023 all the peripheral blood shows mild macrocytic anemia-bone marrow biopsy shows Overall normocellular bone marrow with orderly trilineage  hematopoiesis,-  Essentially absent histiocytic iron stores; conventional cytogenetics normal.  # B12 def-[ B12 [July 2023]- 156-recommend B12 injection today.  And that after once every 3 months; this could be given at home.  Prescription sent.  I spoke at length with the patient's daughter, [name] regarding the patient's clinical status/plan of care.  Family agreement.   Care giver-[place]  # DISPOSITION:  # B12 injection today # follow up in 4 months- MD; labs-cbc/bmp; iron studies;ferritin- B12 levels -Dr.B        Cammie Sickle, MD 06/04/2022 3:08 PM

## 2022-06-04 NOTE — Assessment & Plan Note (Addendum)
#  Chronic macrocytic anemia-unclear etiology-hemoglobin around 10-mild thrombocytopenia greater than 100 platelets- suspect multifactorial. BONE MARROW- June 2023 all the peripheral blood shows mild macrocytic anemia-bone marrow biopsy shows Overall normocellular bone marrow with orderly trilineage  hematopoiesis,- Essentially absent histiocytic iron stores; conventional cytogenetics normal.  # B12 def-[ B12 [July 2023]- 156-recommend B12 injection today.  And that after once every 3 months; this could be given at home.  Prescription sent.  I spoke at length with the patient's daughter, [name] regarding the patient's clinical status/plan of care.  Family agreement.   Care giver-[place]  # DISPOSITION:  # B12 injection today # follow up in 4 months- MD; labs-cbc/bmp; iron studies;ferritin- B12 levels -Dr.B

## 2022-06-13 ENCOUNTER — Telehealth: Payer: Self-pay

## 2022-06-13 MED ORDER — NYSTATIN 100000 UNIT/GM EX CREA: 1.0000 | TOPICAL_CREAM | Freq: Two times a day (BID) | CUTANEOUS | 0 refills | Status: DC

## 2022-06-13 NOTE — Telephone Encounter (Signed)
Patient states she would like for Dr. Einar Pheasant to send in a prescription for some nystatin cream.  Patient states she has had this cream in the past.  Patient states she is having some redness under her breast and stomach, like a fungus infection.  Patient states is started two days ago and the nystatin (MYCOSTATIN/NYSTOP) powder is not working.  Patient states the affected areas are very painful.  Patient states she would like for Dr. Nicki Reaper to call this in as soon as possible.  *Patient states her preferred pharmacy is [facility].

## 2022-06-13 NOTE — Telephone Encounter (Signed)
Patient notified that cream was sent & will call if sx persist.

## 2022-06-13 NOTE — Telephone Encounter (Signed)
Notify pt - Rx for nystatin cream sent to [facility].  Let us know if a persistent problem.

## 2022-06-13 NOTE — Telephone Encounter (Signed)
Are okay sending for patient or would you prefer me to see if someone can see patient for evaluation?

## 2022-06-21 DIAGNOSIS — Z0100 Encounter for examination of eyes and vision without abnormal findings: Secondary | ICD-10-CM | POA: Diagnosis not present

## 2022-06-24 ENCOUNTER — Telehealth: Payer: Self-pay | Admitting: Internal Medicine

## 2022-06-24 NOTE — Telephone Encounter (Signed)
Pt called stating the provider wanted her to call back and if her right ear was not getting better

## 2022-06-25 NOTE — Telephone Encounter (Signed)
Spoke w/pt in regards to ear pain she states it is still sore and she still has pain behind the ear as well. Pt states she believes she needs a CT scan done.

## 2022-06-26 NOTE — Telephone Encounter (Signed)
Per last note, she reported ear irritation.  Need to clarify exactly what symptoms she is having to be able to determine next best step.

## 2022-06-26 NOTE — Telephone Encounter (Signed)
If having head pain now in addition to ear irritation, needs to be evaluated - needs an appt - to know what is best test to evaluate

## 2022-06-26 NOTE — Telephone Encounter (Signed)
Pt scheduled with Dr. Volanda Napoleon on tomorrow at 140 pm

## 2022-06-26 NOTE — Telephone Encounter (Signed)
Pt stated she has pain behind the ear as well as the irritation inside but now its pain behind it.

## 2022-06-27 ENCOUNTER — Ambulatory Visit (INDEPENDENT_AMBULATORY_CARE_PROVIDER_SITE_OTHER): Payer: Medicare HMO | Admitting: Family Medicine

## 2022-06-27 ENCOUNTER — Encounter: Payer: Self-pay | Admitting: Family Medicine

## 2022-06-27 VITALS — BP 134/76 | HR 63 | Temp 97.6°F | Ht 62.0 in | Wt 179.2 lb

## 2022-06-27 DIAGNOSIS — T1490XA Injury, unspecified, initial encounter: Secondary | ICD-10-CM

## 2022-06-27 DIAGNOSIS — M542 Cervicalgia: Secondary | ICD-10-CM

## 2022-06-27 NOTE — Patient Instructions (Addendum)
It was a pleasure meeting you today. Thank you for allowing me to take part in your health care.  Our goals for today as we discussed include:  For your right ear I think this is caused by friction from your neck pillow Recommend trial of small amount dermacloud on area Recommend ear donut pillow, can be purchased on [employer]   Follow up with Dermatology as scheduled   Please follow-up with PCP on Dec 6 or sooner if needed  If you have any questions or concerns, please do not hesitate to call the office at [phone].  I look forward to our next visit and until then take care and stay safe.  Regards,   Carollee Leitz, MD   [facility]

## 2022-06-27 NOTE — Progress Notes (Signed)
SUBJECTIVE:   CHIEF COMPLAINT / HPI: right ear pain  Patient presents to clinic with concern for intermittent right ear sore that has been ongoing this past year.  Has seen multiple providers for same and seems to recur.  Reports pain level 7.5 today. Pain on right side of head and neck and in ear.  Unable to wear hearing aid in right ear. Also endorses itching of ear.  No ringing of ear.  Hearing decrease at baseline.  Denies any fevers, headaches, nasal congestion.  Reports that she uses neck pillow regularly and when sleeping at night.  PERTINENT  PMH / PSH:  Cervical spine surgery  OBJECTIVE:   BP 134/76 (BP Location: Right Arm, Patient Position: Sitting, Cuff Size: Normal)   Pulse 63   Temp 97.6 F (36.4 C) (Oral)   Ht 5' 2" (1.575 m)   Wt 179 lb 3.2 oz (81.3 kg)   SpO2 98%   BMI 32.78 kg/m    General: Alert, no acute distress HEENT: TM's visible bilaterally, no erythema, retraction, effusion noted.  Canals normal bilaterally.  2 small healing wounds noted on right ear, 1 located on right antitragus, 1 located on right antihelix. No pain on palpation. Neck: No vertebral point tenderness.  Cervical neck contracted to right, at baseline. Cardio: Normal S1 and S2, RRR, no r/m/g Pulm: CTAB, normal work of breathing  ASSESSMENT/PLAN:   Soft tissue injury Suspect pressure injury from long term use of neck pillow in chair and at night.  Low suspicion for infectious cause given chronic intermittent concern and no systemic signs on exam. -Recommend use of donut pillow to relieve pressure -Recommend use of barrier ointment to promote healing  -Avoid scratching -Tylenol 325-500 mg every 6 hours as needed for discomfort -Follow up with Dermatology as scheduled -If no improvement in 1 week, follow up with PCP    PDMP Reviewed  Carollee Leitz, MD

## 2022-07-05 ENCOUNTER — Telehealth: Payer: Self-pay

## 2022-07-05 DIAGNOSIS — Z03818 Encounter for observation for suspected exposure to other biological agents ruled out: Secondary | ICD-10-CM | POA: Diagnosis not present

## 2022-07-05 DIAGNOSIS — N329 Bladder disorder, unspecified: Secondary | ICD-10-CM | POA: Diagnosis not present

## 2022-07-05 DIAGNOSIS — J441 Chronic obstructive pulmonary disease with (acute) exacerbation: Secondary | ICD-10-CM | POA: Diagnosis not present

## 2022-07-05 NOTE — Telephone Encounter (Signed)
Patient states she is concerned that she may have pneumonia.  Patient states she is experiencing a deep productive cough (brown, yellow, green) and it hurts in her chest. Patient states this started last night.  Patient states her legs are red, warm, and just a little swollen.  I transferred call to Access Nurse.

## 2022-07-05 NOTE — Telephone Encounter (Signed)
Pt advised to go immediatley to [facility] to be evaluated. Pt agreeable, will go now

## 2022-07-05 NOTE — Telephone Encounter (Signed)
If chest pain and cough, needs to be evaluated.

## 2022-07-07 ENCOUNTER — Emergency Department: Payer: Medicare HMO

## 2022-07-07 ENCOUNTER — Emergency Department
Admission: EM | Admit: 2022-07-07 | Discharge: 2022-07-07 | Disposition: A | Discharge status: Home / Self Care | Payer: Medicare HMO | Attending: Emergency Medicine | Admitting: Emergency Medicine

## 2022-07-07 ENCOUNTER — Other Ambulatory Visit: Payer: Self-pay

## 2022-07-07 DIAGNOSIS — Z8616 Personal history of COVID-19: Secondary | ICD-10-CM | POA: Diagnosis not present

## 2022-07-07 DIAGNOSIS — Z20822 Contact with and (suspected) exposure to covid-19: Secondary | ICD-10-CM | POA: Diagnosis not present

## 2022-07-07 DIAGNOSIS — I509 Heart failure, unspecified: Secondary | ICD-10-CM | POA: Diagnosis not present

## 2022-07-07 DIAGNOSIS — I110 Hypertensive heart disease with heart failure: Secondary | ICD-10-CM | POA: Diagnosis not present

## 2022-07-07 DIAGNOSIS — J189 Pneumonia, unspecified organism: Secondary | ICD-10-CM | POA: Diagnosis not present

## 2022-07-07 DIAGNOSIS — R918 Other nonspecific abnormal finding of lung field: Secondary | ICD-10-CM | POA: Diagnosis not present

## 2022-07-07 DIAGNOSIS — I959 Hypotension, unspecified: Secondary | ICD-10-CM | POA: Diagnosis not present

## 2022-07-07 DIAGNOSIS — R0602 Shortness of breath: Secondary | ICD-10-CM | POA: Diagnosis not present

## 2022-07-07 DIAGNOSIS — J449 Chronic obstructive pulmonary disease, unspecified: Secondary | ICD-10-CM | POA: Diagnosis not present

## 2022-07-07 DIAGNOSIS — I4891 Unspecified atrial fibrillation: Secondary | ICD-10-CM | POA: Diagnosis not present

## 2022-07-07 DIAGNOSIS — E039 Hypothyroidism, unspecified: Secondary | ICD-10-CM | POA: Insufficient documentation

## 2022-07-07 DIAGNOSIS — R059 Cough, unspecified: Secondary | ICD-10-CM | POA: Diagnosis not present

## 2022-07-07 LAB — CBC
HCT: 30.8 % — ABNORMAL LOW (ref 36.0–46.0)
Hemoglobin: 9.8 g/dL — ABNORMAL LOW (ref 12.0–15.0)
MCH: 32.5 pg (ref 26.0–34.0)
MCHC: 31.8 g/dL (ref 30.0–36.0)
MCV: 102 fL — ABNORMAL HIGH (ref 80.0–100.0)
Platelets: 135 10*3/uL — ABNORMAL LOW (ref 150–400)
RBC: 3.02 MIL/uL — ABNORMAL LOW (ref 3.87–5.11)
RDW: 13.9 % (ref 11.5–15.5)
WBC: 8.8 10*3/uL (ref 4.0–10.5)
nRBC: 0 % (ref 0.0–0.2)

## 2022-07-07 LAB — COMPREHENSIVE METABOLIC PANEL
ALT: 12 U/L (ref 0–44)
AST: 30 U/L (ref 15–41)
Albumin: 3.7 g/dL (ref 3.5–5.0)
Alkaline Phosphatase: 68 U/L (ref 38–126)
Anion gap: 7 (ref 5–15)
BUN: 18 mg/dL (ref 8–23)
CO2: 28 mmol/L (ref 22–32)
Calcium: 9.1 mg/dL (ref 8.9–10.3)
Chloride: 103 mmol/L (ref 98–111)
Creatinine, Ser: 0.7 mg/dL (ref 0.44–1.00)
GFR, Estimated: >60 mL/min (ref >60)
Glucose, Bld: 131 mg/dL — ABNORMAL HIGH (ref 70–99)
Potassium: 3.3 mmol/L — ABNORMAL LOW (ref 3.5–5.1)
Sodium: 138 mmol/L (ref 135–145)
Total Bilirubin: 0.9 mg/dL (ref 0.3–1.2)
Total Protein: 6.8 g/dL (ref 6.5–8.1)

## 2022-07-07 LAB — RESPIRATORY PANEL BY PCR

## 2022-07-07 LAB — TROPONIN I (HIGH SENSITIVITY): Troponin I (High Sensitivity): 13 ng/L (ref <18)

## 2022-07-07 LAB — RESP PANEL BY RT-PCR (FLU A&B, COVID) ARPGX2
Influenza A by PCR: NEGATIVE
Influenza B by PCR: NEGATIVE
SARS Coronavirus 2 by RT PCR: NEGATIVE

## 2022-07-07 LAB — PROCALCITONIN: Procalcitonin: <0.1 ng/mL

## 2022-07-07 MED ORDER — PREDNISONE 50 MG PO TABS: ORAL_TABLET | ORAL | 0 refills | Status: DC

## 2022-07-07 MED ORDER — AMOXICILLIN-POT CLAVULANATE 875-125 MG PO TABS
1.0000 | ORAL_TABLET | Freq: Once | ORAL | Status: AC
  Administered 2022-07-07: 1 via ORAL
  Filled 2022-07-07: qty 1

## 2022-07-07 MED ORDER — AMOXICILLIN-POT CLAVULANATE 875-125 MG PO TABS: 1.0000 | ORAL_TABLET | Freq: Two times a day (BID) | ORAL | 0 refills | Status: DC

## 2022-07-07 MED ORDER — SODIUM CHLORIDE 0.9 % IV BOLUS
500.0000 mL | Freq: Once | INTRAVENOUS | Status: AC
  Administered 2022-07-07: 500 mL via INTRAVENOUS

## 2022-07-07 NOTE — ED Provider Notes (Signed)
[facility] Provider Note    Event Date/Time   First MD Initiated Contact with Patient 07/07/22 1024     (approximate)   History   Shortness of Breath   HPI  Kimberly Roy is a 88 y.o. female with past medical history of COPD, CHF, DVT who presents with shortness of breath and cough.  Symptoms started 2 days ago.  Patient endorses dyspnea on exertion and cough productive of yellow sputum.  Has not had fevers or chills.  Does endorse pain in the left front of her chest that is worse with coughing and deep inspiration.  She has had decreased appetite.  She has peripheral edema but this is chronic for her and unchanged.  She went to urgent care 2 days ago on 11/3 and was prescribed Tessalon Perles doxycycline and prednisone for COPD exacerbation.  Patient says no chest x-ray was done at this time.    Past Medical History:  Diagnosis Date   Anemia    Anxiety    Chest pain    CHF (congestive heart failure) ([place])    Constipation    DDD (degenerative disc disease), cervical    Depression    DVT (deep venous thrombosis) (HCC)    Dysphonia    Dyspnea    Fatty liver    Fatty liver    Headache    Hyperlipidemia    Hyperpiesia    Hypertension    Hypothyroidism    Interstitial cystitis    Left ventricular dysfunction    Lymphedema    Nephrolithiasis    Obstructive sleep apnea    Osteoarthritis    knees/cervical and lumbar spine   Pulmonary hypertension (HCC)    Pulmonary nodules    followed by Dr Raul Del   Pure hypercholesterolemia    Renal cyst    right    Patient Active Problem List   Diagnosis Date Noted   Anemia 04/13/2022   B12 deficiency 04/04/2022   Interstitial cystitis 03/28/2022   Iron deficiency 02/28/2022   Right foot pain 01/29/2022   SOB (shortness of breath) 10/14/2021   Rash 03/03/2021   Skin lesion 03/03/2021   Aortic atherosclerosis ([place]) 11/20/2020   Bradycardia 06/08/2020   Itching 04/08/2020   Dyspnea 01/17/2020    Chronic respiratory failure with hypoxia ([place]) 01/17/2020   Right hip pain 01/10/2020   Leukocytosis 11/15/2019   Wound of buttock 10/25/2019   COVID-19 virus infection 10/04/2019   Urinary frequency 09/26/2019   Cold feeling 09/12/2019   Left knee pain 07/04/2019   Hemoptysis 06/30/2019   Femur fracture ([place]) 08/06/2018   Primary osteoarthritis of right shoulder 08/15/2017   Right shoulder pain 05/16/2017   Chronic venous insufficiency 02/10/2017   Lymphedema 02/10/2017   Neuropathy 12/25/2016   Anemia due to blood loss, chronic 08/07/2016   GERD (gastroesophageal reflux disease) 12/10/2015   Finger pain 11/13/2015   Carotid artery calcification 09/26/2015   External nasal lesion 09/25/2015   Muscle cramps 09/01/2015   Excessive sweating 07/30/2015   Headache 07/16/2015   Groin pain 05/14/2015   Muscle twitching 03/05/2015   Leg cramps 02/11/2015   Back pain 02/11/2015   Abdominal pain 02/11/2015   Chronic pain 01/19/2015   Acute cystitis without hematuria 12/12/2014   Left elbow pain 11/30/2014   Health care maintenance 11/30/2014   Osteoporosis 10/19/2014   Rectal bleeding 10/19/2014   Neck pain 09/03/2014   Unsteady gait 09/03/2014   Nocturia 09/03/2014   Dysphagia 06/01/2014   Nasal dryness  06/01/2014   Stress 06/01/2014   Fatigue 02/27/2014   Degenerative disc disease 12/21/2013   Pre-op evaluation 10/12/2013   Hoarseness 08/14/2013   Leg swelling 01/24/2013   CHF (congestive heart failure) (HCC) 12/29/2012   Cough 12/29/2012   OSA (obstructive sleep apnea) 07/09/2012   Osteoarthritis 07/04/2012   Symptomatic anemia 07/04/2012   Chronic constipation 07/04/2012   Pulmonary hypertension ([place]) 07/04/2012   Pulmonary nodules 07/04/2012   Hypertension 07/04/2012   Hyperlipidemia 07/04/2012   Hypothyroidism 07/04/2012     Physical Exam  Triage Vital Signs: ED Triage Vitals  Enc Vitals Group     BP 07/07/22 1020 (!) 149/53     Pulse Rate 07/07/22 1017 82      Resp 07/07/22 1017 20     Temp 07/07/22 1020 99 F (37.2 C)     Temp Source 07/07/22 1020 Oral     SpO2 07/07/22 1017 96 %     Weight 07/07/22 1017 180 lb (81.6 kg)     Height 07/07/22 1017 _0  (1.575 m)     Head Circumference --      Peak Flow --      Pain Score 07/07/22 1017 0     Pain Loc --      Pain Edu? --      Excl. in [place]? --     Most recent vital signs: Vitals:   07/07/22 1235 07/07/22 1303  BP: (!) 150/56   Pulse: 79   Resp: (!) 21   Temp:    SpO2: 96% 96%     General: Awake, no distress.  CV:  Good peripheral perfusion.  Resp:  Normal effort.  Scattered rhonchi throughout, no clear wheezing, no increased work of breathing Abd:  No distention.  Neuro:             Awake, Alert, Oriented x 3  Other:  Trace pitting edema in bilateral lower extremities   ED Results / Procedures / Treatments  Labs (all labs ordered are listed, but only abnormal results are displayed) Labs Reviewed  CBC - Abnormal; Notable for the following components:      Result Value   RBC 3.02 (*)    Hemoglobin 9.8 (*)    HCT 30.8 (*)    MCV 102.0 (*)    Platelets 135 (*)    All other components within normal limits  COMPREHENSIVE METABOLIC PANEL - Abnormal; Notable for the following components:   Potassium 3.3 (*)    Glucose, Bld 131 (*)    All other components within normal limits  RESP PANEL BY RT-PCR (FLU A&B, COVID) ARPGX2  RESPIRATORY PANEL BY PCR  PROCALCITONIN  TROPONIN I (HIGH SENSITIVITY)     EKG  EKG reviewed interpreted by myself shows sinus rhythm with frequent PACs in a pattern of supraventricular bigeminy, right bundle branch block pattern, no acute ischemic changes   RADIOLOGY Reviewed and interpreted the CT chest with contrast which is consistent with pneumonia on the right   PROCEDURES:  Critical Care performed: No  Procedures  The patient is on the cardiac monitor to evaluate for evidence of arrhythmia and/or significant heart rate  changes.   MEDICATIONS ORDERED IN ED: Medications  amoxicillin-clavulanate (AUGMENTIN) 875-125 MG per tablet 1 tablet (has no administration in time range)  sodium chloride 0.9 % bolus 500 mL (0 mLs Intravenous Stopped 07/07/22 1210)     IMPRESSION / MDM / ASSESSMENT AND PLAN / ED COURSE  I reviewed the triage vital signs and the  nursing notes.                              Patient's presentation is most consistent with acute presentation with potential threat to life or bodily function.  Differential diagnosis includes, but is not limited to, COPD exacerbation, bronchitis, viral syndrome, bacterial pneumonia  The patient is an 88 year old female with underlying COPD wears 2 L nasal cannula chronically presenting with productive cough and shortness of breath.  She is already seen in urgent care 2 days ago and is on doxycycline and prednisone as well as [employer].  Presents today because she is not improving says that she was upset but no x-ray was done at the time of her urgent care visit.  She is temp is 99 he is mildly hypertensive satting okay on her 2 L baseline O2.  She has no increased work of breathing but does have a frequent wet sounding cough and she has scattered rhonchi on lung exam without significant wheezing and is moving good air.  Differential is as above.  Plan check labs chest x-ray.  We will give a small fluid bolus.  Patient's chest x-ray is somewhat nondiagnostic.  There is increased opacity in the right upper lung field radiology comments that this is likely soft tissue.  Given patient's disability and body habitus she will not be able to do an upright chest x-ray so we will obtain a CT chest without contrast to further evaluate for underlying infiltrate.  CT chest does show multifocal pneumonia on the right side.  Interestingly patient's procalcitonin is negative.  COVID and flu test are negative but suspect this could be viral especially given the appearance on CT  so I sent an extended respiratory viral panel.  Patient's daughter is now at bedside tells me she patient wears 2 L of nasal cannula oxygen at nighttime only.  When I took the patient off oxygen she did maintain her sats.  Right now she is on 2 L satting 96%.  I had a long discussion with the patient and her daughter about the patient's condition and I offered admission given her age and comorbidities I think she is at high risk for deterioration.  I did however discussed that it would not be unreasonable for them to go home and continue treatment at home given she is not having any increased O2 requirement and does not appear septic.  We discussed the risk and benefits of both.  Ultimately the patient decided that she would like to try outpatient treatment.  Despite the procalcitonin being negative we will continue treatment for CAP.  Patient was prescribed doxycycline which will cover atypicals.  Given she has comorbidities she should have another agent added to this for adequate coverage of pneumonia.  We will add on Augmentin twice a day for 5 days.  Encouraged ongoing use of steroids and albuterol inhaler.  Discussed tricked return precautions for worsening symptoms.     FINAL CLINICAL IMPRESSION(S) / ED DIAGNOSES   Final diagnoses:  Community acquired pneumonia of right lung, unspecified part of lung     Rx / DC Orders   ED Discharge Orders          Ordered    amoxicillin-clavulanate (AUGMENTIN) 875-125 MG tablet  2 times daily        07/07/22 1308    predniSONE (DELTASONE) 50 MG tablet        07/07/22 1308  Note:  This document was prepared using Dragon voice recognition software and may include unintentional dictation errors.   Rada Hay, MD 07/07/22 1308

## 2022-07-07 NOTE — Discharge Instructions (Signed)
You have pneumonia.  This could be from bacterial infection but based on your blood work I suspect that is from a viral infection.  Continue to take the doxycycline twice a day for the remainder of the prescription.  I have also prescribed 5 days of Augmentin which she should take twice a day.  Please also take 40 mg of prednisone for the next 5 days.  You can continue to use your albuterol inhaler as needed.  If your breathing is worsening please return to the emergency department.

## 2022-07-07 NOTE — ED Triage Notes (Signed)
Pt arrives via EMS from home for [facility]- pt was seen at [facility] on Friday fro the same and was tested negative for everything but did not get a chest xray- per EMS pt was 91% on RA and was placed on 4L [place]- pt also has a productive cough

## 2022-07-09 ENCOUNTER — Other Ambulatory Visit: Payer: Self-pay | Admitting: Internal Medicine

## 2022-07-10 ENCOUNTER — Telehealth: Payer: Self-pay | Admitting: *Deleted

## 2022-07-10 ENCOUNTER — Ambulatory Visit: Payer: Medicare HMO | Admitting: Dermatology

## 2022-07-10 NOTE — Patient Outreach (Signed)
  Care Coordination [facility] Note Transition Care Management Follow-up Telephone Call Date of discharge and from where: [facility] [phone] How have you been since you were released from the hospital? I feel lousy , but better than when I went to the ED Any questions or concerns? No  Items Reviewed: Did the pt receive and understand the discharge instructions provided? Yes  Medications obtained and verified? Yes  Other? No  Any new allergies since your discharge? No  Dietary orders reviewed? No Do you have support at home? Yes   Home Care and Equipment/Supplies: Were home health services ordered? not applicable If so, what is the name of the agency? N/a  Has the agency set up a time to come to the patient's home? not applicable Were any new equipment or medical supplies ordered?  No What is the name of the medical supply agency? N/a Were you able to get the supplies/equipment? not applicable Do you have any questions related to the use of the equipment or supplies? No  Functional Questionnaire: (I = Independent and D = Dependent) ADLs: I  Bathing/Dressing- I  Meal Prep- I  Eating- I  Maintaining continence- I  Transferring/Ambulation- I  Managing Meds- O  Follow up appointments reviewed:  PCP Hospital f/u appt confirmed? No   Specialist Hospital f/u appt confirmed? No   Are transportation arrangements needed? No  If their condition worsens, is the pt aware to call PCP or go to the Emergency Dept.? Yes Was the patient provided with contact information for the PCP's office or ED? Yes Was to pt encouraged to call back with questions or concerns? Yes  SDOH assessments and interventions completed:   Yes  Care Coordination Interventions Activated:  Yes   Care Coordination Interventions:  PCP follow up appointment requested   Encounter Outcome:  Pt. Visit Completed    [place] Management [phone]

## 2022-07-11 ENCOUNTER — Emergency Department: Payer: Medicare HMO

## 2022-07-11 ENCOUNTER — Ambulatory Visit (INDEPENDENT_AMBULATORY_CARE_PROVIDER_SITE_OTHER): Payer: Medicare HMO | Admitting: Internal Medicine

## 2022-07-11 ENCOUNTER — Encounter: Payer: Self-pay | Admitting: Internal Medicine

## 2022-07-11 ENCOUNTER — Inpatient Hospital Stay
Admission: EM | Admit: 2022-07-11 | Discharge: 2022-07-15 | DRG: 193 | Disposition: A | Discharge status: Home Health | Payer: Medicare HMO | Attending: Internal Medicine | Admitting: Internal Medicine

## 2022-07-11 VITALS — BP 160/90 | HR 84

## 2022-07-11 DIAGNOSIS — M5030 Other cervical disc degeneration, unspecified cervical region: Secondary | ICD-10-CM | POA: Diagnosis present

## 2022-07-11 DIAGNOSIS — R918 Other nonspecific abnormal finding of lung field: Secondary | ICD-10-CM | POA: Diagnosis present

## 2022-07-11 DIAGNOSIS — I272 Pulmonary hypertension, unspecified: Secondary | ICD-10-CM | POA: Diagnosis present

## 2022-07-11 DIAGNOSIS — Z79890 Hormone replacement therapy (postmenopausal): Secondary | ICD-10-CM

## 2022-07-11 DIAGNOSIS — Z87442 Personal history of urinary calculi: Secondary | ICD-10-CM | POA: Diagnosis not present

## 2022-07-11 DIAGNOSIS — Z20822 Contact with and (suspected) exposure to covid-19: Secondary | ICD-10-CM | POA: Diagnosis not present

## 2022-07-11 DIAGNOSIS — R0609 Other forms of dyspnea: Secondary | ICD-10-CM | POA: Diagnosis present

## 2022-07-11 DIAGNOSIS — F32A Depression, unspecified: Secondary | ICD-10-CM | POA: Diagnosis present

## 2022-07-11 DIAGNOSIS — I110 Hypertensive heart disease with heart failure: Secondary | ICD-10-CM | POA: Diagnosis not present

## 2022-07-11 DIAGNOSIS — I160 Hypertensive urgency: Secondary | ICD-10-CM | POA: Insufficient documentation

## 2022-07-11 DIAGNOSIS — J168 Pneumonia due to other specified infectious organisms: Secondary | ICD-10-CM | POA: Diagnosis not present

## 2022-07-11 DIAGNOSIS — Z90710 Acquired absence of both cervix and uterus: Secondary | ICD-10-CM

## 2022-07-11 DIAGNOSIS — F419 Anxiety disorder, unspecified: Secondary | ICD-10-CM | POA: Diagnosis present

## 2022-07-11 DIAGNOSIS — B9789 Other viral agents as the cause of diseases classified elsewhere: Secondary | ICD-10-CM | POA: Diagnosis present

## 2022-07-11 DIAGNOSIS — E7800 Pure hypercholesterolemia, unspecified: Secondary | ICD-10-CM | POA: Diagnosis present

## 2022-07-11 DIAGNOSIS — R0602 Shortness of breath: Secondary | ICD-10-CM | POA: Diagnosis not present

## 2022-07-11 DIAGNOSIS — I350 Nonrheumatic aortic (valve) stenosis: Secondary | ICD-10-CM | POA: Diagnosis not present

## 2022-07-11 DIAGNOSIS — R0902 Hypoxemia: Secondary | ICD-10-CM

## 2022-07-11 DIAGNOSIS — I10 Essential (primary) hypertension: Secondary | ICD-10-CM

## 2022-07-11 DIAGNOSIS — K760 Fatty (change of) liver, not elsewhere classified: Secondary | ICD-10-CM | POA: Diagnosis present

## 2022-07-11 DIAGNOSIS — G8929 Other chronic pain: Secondary | ICD-10-CM | POA: Diagnosis present

## 2022-07-11 DIAGNOSIS — Z91048 Other nonmedicinal substance allergy status: Secondary | ICD-10-CM

## 2022-07-11 DIAGNOSIS — J129 Viral pneumonia, unspecified: Secondary | ICD-10-CM | POA: Diagnosis not present

## 2022-07-11 DIAGNOSIS — G4733 Obstructive sleep apnea (adult) (pediatric): Secondary | ICD-10-CM | POA: Diagnosis present

## 2022-07-11 DIAGNOSIS — Z79899 Other long term (current) drug therapy: Secondary | ICD-10-CM

## 2022-07-11 DIAGNOSIS — Z8249 Family history of ischemic heart disease and other diseases of the circulatory system: Secondary | ICD-10-CM

## 2022-07-11 DIAGNOSIS — M40204 Unspecified kyphosis, thoracic region: Secondary | ICD-10-CM | POA: Diagnosis present

## 2022-07-11 DIAGNOSIS — D6489 Other specified anemias: Secondary | ICD-10-CM | POA: Diagnosis present

## 2022-07-11 DIAGNOSIS — J9611 Chronic respiratory failure with hypoxia: Secondary | ICD-10-CM

## 2022-07-11 DIAGNOSIS — I872 Venous insufficiency (chronic) (peripheral): Secondary | ICD-10-CM | POA: Diagnosis present

## 2022-07-11 DIAGNOSIS — Z96653 Presence of artificial knee joint, bilateral: Secondary | ICD-10-CM | POA: Diagnosis present

## 2022-07-11 DIAGNOSIS — Z882 Allergy status to sulfonamides status: Secondary | ICD-10-CM

## 2022-07-11 DIAGNOSIS — R059 Cough, unspecified: Secondary | ICD-10-CM | POA: Diagnosis not present

## 2022-07-11 DIAGNOSIS — E039 Hypothyroidism, unspecified: Secondary | ICD-10-CM | POA: Diagnosis present

## 2022-07-11 DIAGNOSIS — J440 Chronic obstructive pulmonary disease with acute lower respiratory infection: Secondary | ICD-10-CM | POA: Diagnosis present

## 2022-07-11 DIAGNOSIS — M170 Bilateral primary osteoarthritis of knee: Secondary | ICD-10-CM | POA: Diagnosis present

## 2022-07-11 DIAGNOSIS — I5032 Chronic diastolic (congestive) heart failure: Secondary | ICD-10-CM | POA: Diagnosis present

## 2022-07-11 DIAGNOSIS — Z7982 Long term (current) use of aspirin: Secondary | ICD-10-CM

## 2022-07-11 DIAGNOSIS — J189 Pneumonia, unspecified organism: Secondary | ICD-10-CM | POA: Diagnosis present

## 2022-07-11 DIAGNOSIS — Z9842 Cataract extraction status, left eye: Secondary | ICD-10-CM

## 2022-07-11 DIAGNOSIS — Z79891 Long term (current) use of opiate analgesic: Secondary | ICD-10-CM

## 2022-07-11 DIAGNOSIS — Z9841 Cataract extraction status, right eye: Secondary | ICD-10-CM

## 2022-07-11 DIAGNOSIS — J9621 Acute and chronic respiratory failure with hypoxia: Secondary | ICD-10-CM | POA: Diagnosis not present

## 2022-07-11 DIAGNOSIS — I5033 Acute on chronic diastolic (congestive) heart failure: Secondary | ICD-10-CM | POA: Diagnosis not present

## 2022-07-11 DIAGNOSIS — R519 Headache, unspecified: Secondary | ICD-10-CM | POA: Diagnosis not present

## 2022-07-11 DIAGNOSIS — Z8719 Personal history of other diseases of the digestive system: Secondary | ICD-10-CM

## 2022-07-11 DIAGNOSIS — M47812 Spondylosis without myelopathy or radiculopathy, cervical region: Secondary | ICD-10-CM | POA: Diagnosis present

## 2022-07-11 DIAGNOSIS — M47816 Spondylosis without myelopathy or radiculopathy, lumbar region: Secondary | ICD-10-CM | POA: Diagnosis present

## 2022-07-11 DIAGNOSIS — Z823 Family history of stroke: Secondary | ICD-10-CM | POA: Diagnosis not present

## 2022-07-11 DIAGNOSIS — I428 Other cardiomyopathies: Secondary | ICD-10-CM | POA: Diagnosis not present

## 2022-07-11 DIAGNOSIS — R54 Age-related physical debility: Secondary | ICD-10-CM | POA: Diagnosis present

## 2022-07-11 DIAGNOSIS — Z881 Allergy status to other antibiotic agents status: Secondary | ICD-10-CM

## 2022-07-11 DIAGNOSIS — R5383 Other fatigue: Secondary | ICD-10-CM | POA: Diagnosis not present

## 2022-07-11 DIAGNOSIS — Z888 Allergy status to other drugs, medicaments and biological substances status: Secondary | ICD-10-CM

## 2022-07-11 DIAGNOSIS — I878 Other specified disorders of veins: Secondary | ICD-10-CM | POA: Diagnosis present

## 2022-07-11 DIAGNOSIS — Z791 Long term (current) use of non-steroidal anti-inflammatories (NSAID): Secondary | ICD-10-CM

## 2022-07-11 LAB — COMPREHENSIVE METABOLIC PANEL
ALT: 18 U/L (ref 0–44)
AST: 25 U/L (ref 15–41)
Albumin: 3.7 g/dL (ref 3.5–5.0)
Alkaline Phosphatase: 70 U/L (ref 38–126)
Anion gap: 7 (ref 5–15)
BUN: 20 mg/dL (ref 8–23)
CO2: 33 mmol/L — ABNORMAL HIGH (ref 22–32)
Calcium: 9.3 mg/dL (ref 8.9–10.3)
Chloride: 99 mmol/L (ref 98–111)
Creatinine, Ser: 0.7 mg/dL (ref 0.44–1.00)
GFR, Estimated: >60 mL/min (ref >60)
Glucose, Bld: 209 mg/dL — ABNORMAL HIGH (ref 70–99)
Potassium: 3.7 mmol/L (ref 3.5–5.1)
Sodium: 139 mmol/L (ref 135–145)
Total Bilirubin: 0.6 mg/dL (ref 0.3–1.2)
Total Protein: 6.8 g/dL (ref 6.5–8.1)

## 2022-07-11 LAB — CBC
HCT: 31.4 % — ABNORMAL LOW (ref 36.0–46.0)
Hemoglobin: 10.2 g/dL — ABNORMAL LOW (ref 12.0–15.0)
MCH: 32.4 pg (ref 26.0–34.0)
MCHC: 32.5 g/dL (ref 30.0–36.0)
MCV: 99.7 fL (ref 80.0–100.0)
Platelets: 188 10*3/uL (ref 150–400)
RBC: 3.15 MIL/uL — ABNORMAL LOW (ref 3.87–5.11)
RDW: 13.1 % (ref 11.5–15.5)
WBC: 6.5 10*3/uL (ref 4.0–10.5)
nRBC: 0 % (ref 0.0–0.2)

## 2022-07-11 LAB — RESP PANEL BY RT-PCR (FLU A&B, COVID) ARPGX2
Influenza A by PCR: NEGATIVE
Influenza B by PCR: NEGATIVE
SARS Coronavirus 2 by RT PCR: NEGATIVE

## 2022-07-11 MED ORDER — ALBUTEROL SULFATE (2.5 MG/3ML) 0.083% IN NEBU: 2.5000 mg | INHALATION_SOLUTION | RESPIRATORY_TRACT | Status: DC | PRN

## 2022-07-11 MED ORDER — IPRATROPIUM-ALBUTEROL 0.5-2.5 (3) MG/3ML IN SOLN
3.0000 mL | Freq: Once | RESPIRATORY_TRACT | Status: AC
  Administered 2022-07-11: 3 mL via RESPIRATORY_TRACT
  Filled 2022-07-11: qty 3

## 2022-07-11 MED ORDER — DOXYCYCLINE HYCLATE 100 MG PO CAPS: 100.0000 mg | ORAL_CAPSULE | Freq: Two times a day (BID) | ORAL | 0 refills | Status: DC

## 2022-07-11 MED ORDER — HYDRALAZINE HCL 20 MG/ML IJ SOLN
10.0000 mg | Freq: Once | INTRAMUSCULAR | Status: AC
  Administered 2022-07-12: 10 mg via INTRAVENOUS
  Filled 2022-07-11: qty 1

## 2022-07-11 NOTE — ED Triage Notes (Signed)
Pt sts that she has a rattle in her chest. Pt continues to sts that it is more than likely due to pneumonia that she is having. Pt sts that she was able to pick up the meds that her PCP given her.

## 2022-07-11 NOTE — Assessment & Plan Note (Signed)
Normally uses oxygen at night only.  With recent infection, has required oxygen during the day.  Oxygen saturation noted to be 88% with minimal exertion in exam room.  Discussed need for further w/up, evaluation and treatment.  Referred to ER as outlined.

## 2022-07-11 NOTE — ED Provider Notes (Signed)
[facility] Provider Note    Event Date/Time   First MD Initiated Contact with Patient 07/11/22 2143     (approximate)   History   Rattle in Chest   HPI  Kimberly Roy is a 88 y.o. female  who presents to the emergency department today from PCPs office because of concern for continued shortness of breath and hypoxia. The patient states she was seen in the emergency department 5 days ago for rattling in her chest. States she was diagnosed with pneumonia at that time. Has been taking her antibiotics. Feels like her breathing has neither gotten better or worse. The patient does have an albuterol inhaler which she is using twice a day with some relief. Denies any fevers or chills.     Physical Exam   Triage Vital Signs: ED Triage Vitals  Enc Vitals Group     BP 07/11/22 1632 116/84     Pulse Rate 07/11/22 1632 87     Resp 07/11/22 1632 19     Temp 07/11/22 1632 98.7 F (37.1 C)     Temp Source 07/11/22 1632 Oral     SpO2 07/11/22 1551 91 %     Weight 07/11/22 1552 174 lb (78.9 kg)     Height --      Head Circumference --      Peak Flow --      Pain Score 07/11/22 1552 0     Pain Loc --      Pain Edu? --      Excl. in [place]? --     Most recent vital signs: Vitals:   07/11/22 1632 07/11/22 2139  BP: 116/84 (!) 219/88  Pulse: 87 (!) 110  Resp: 19   Temp: 98.7 F (37.1 C)   SpO2: (!) 89% 98%   General: Awake, alert, oriented. CV:  Good peripheral perfusion. Regular rate and rhythm. Resp:  Normal effort. Diffuse expiratory wheezing.  Abd:  No distention.    ED Results / Procedures / Treatments   Labs (all labs ordered are listed, but only abnormal results are displayed) Labs Reviewed  CBC - Abnormal; Notable for the following components:      Result Value   RBC 3.15 (*)    Hemoglobin 10.2 (*)    HCT 31.4 (*)    All other components within normal limits  COMPREHENSIVE METABOLIC PANEL - Abnormal; Notable for the following components:    CO2 33 (*)    Glucose, Bld 209 (*)    All other components within normal limits  RESP PANEL BY RT-PCR (FLU A&B, COVID) ARPGX2     EKG  None   RADIOLOGY I independently interpreted and visualized the CXR. My interpretation: cardiomegaly Radiology interpretation:  IMPRESSION:  1. Improving patchy right upper lobe airspace disease for passage  from prior exam.  2. Stable cardiomegaly and aortic atherosclerosis.   I independently interpreted and visualized the CT head. My interpretation: No large bleed. Radiology interpretation:  IMPRESSION:  1. No CT evidence for acute intracranial abnormality.  2. Atrophy and chronic small vessel ischemic changes of the white  matter.        PROCEDURES:  Critical Care performed: No  Procedures   MEDICATIONS ORDERED IN ED: Medications  albuterol (PROVENTIL) (2.5 MG/3ML) 0.083% nebulizer solution 2.5 mg (has no administration in time range)     IMPRESSION / MDM / ASSESSMENT AND PLAN / ED COURSE  I reviewed the triage vital signs and the nursing notes.  Differential diagnosis includes, but is not limited to, pneumonia, COPD, CHF.  Patient's presentation is most consistent with acute presentation with potential threat to life or bodily function.  Patient presented to the emergency department today because of concerns for continued shortness of breath as well as slight hypoxia at doctor's office.  Patient was diagnosed with pneumonia 5 days ago.  On exam here patient has diffuse expiratory wheezing. While here the patient's blood pressure was elevated. Complained of headache. CT head was obtained and did not show any concerning findings. She clinically felt better after duoneb treatments. Will have nursing staff get patient up to see if she feels better and oxygen level is appropriate. Additionally patient was ordered medication to help with blood pressure. Do feel if oxygen is appropriate and blood pressure  improves it would be reasonable to discharge patient to follow up closely with primary care.   FINAL CLINICAL IMPRESSION(S) / ED DIAGNOSES   Final diagnoses:  Pneumonia due to infectious organism, unspecified laterality, unspecified part of lung     Note:  This document was prepared using Dragon voice recognition software and may include unintentional dictation errors.    Nance Pear, MD 07/11/22 2337

## 2022-07-11 NOTE — Assessment & Plan Note (Signed)
Persistent increased cough/congestion and sob despite antibiotics and prednisone.  Increased sob with minimal exertion on exam.  Oxygen saturation decreased to 88% with minimal exertion and became more sob.  She is on oxygen at home, but only uses at night.  Has required oxygen during the day since she developed the increased cough and congestion.  Previous xray - pneumonia.  Respiratory swab - positive rhinovirus.  Given above persistent symptoms and worsening sob despite treatment, I do feel she warrants further evaluation - ER.  ER notified.

## 2022-07-11 NOTE — Discharge Instructions (Signed)
Please seek medical attention for any high fevers, chest pain, shortness of breath, change in behavior, persistent vomiting, bloody stool or any other new or concerning symptoms.

## 2022-07-11 NOTE — ED Provider Triage Note (Signed)
Emergency Medicine Provider Triage Evaluation Note  Kimberly Roy , a 88 y.o. female  was evaluated in triage.  Pt complains of continued cough/rattle in her chest. No fever.  Physical Exam  Wt 78.9 kg   SpO2 91%   BMI 31.83 kg/m  Gen:   Awake, no distress   Resp:  Normal effort  MSK:   Moves extremities without difficulty  Other:    Medical Decision Making  Medically screening exam initiated at 3:57 PM.  Appropriate orders placed.  [name] was informed that the remainder of the evaluation will be completed by another provider, this initial triage assessment does not replace that evaluation, and the importance of remaining in the ED until their evaluation is complete.    Victorino Dike, FNP 07/11/22 1558

## 2022-07-11 NOTE — Assessment & Plan Note (Signed)
Increased fatigue - related to current infection.  Refer to ER as outlined

## 2022-07-11 NOTE — Assessment & Plan Note (Signed)
Recently evaluated in ER.  Diagnosed with pneumonia.  Persistent symptoms as outlined. Persistent increased cough/congestion and sob despite antibiotics and prednisone.  Increased sob with minimal exertion on exam.  Oxygen saturation decreased to 88% with minimal exertion and became more sob.  She is on oxygen at home, but only uses at night.  Has required oxygen during the day since she developed the increased cough and congestion.  Previous xray - pneumonia.  Respiratory swab - positive rhinovirus.  Given above persistent symptoms and worsening sob despite treatment, I do feel she warrants further evaluation - ER.  ER notified.

## 2022-07-11 NOTE — Assessment & Plan Note (Signed)
Blood pressure elevated.  Feel related to above.  Continue treatment for infection.  Hold on changing medication.  Follow.

## 2022-07-11 NOTE — Progress Notes (Signed)
Patient ID: Kimberly Roy, female   DOB: 25-Nov-1930, 88 y.o.   MRN: [id number]   Subjective:    Patient ID: Kimberly Roy, female    DOB: March 29, 1931, 88 y.o.   MRN: [id number]    Patient here for  Chief Complaint  Patient presents with   Pneumonia   .   HPI Work in with concerns regarding persistent increased cough, congestion and sob.  She was seen 07/05/22 - prescribed prednisone and doxycycline.  Persistent symptoms and went to ER 07/07/22. CT chest - c/w pneumonia (right).  COVID and flu negative.  Swab positive for rhinovirus/enterovirus. Was placed on augmentin and doxycycline. Prednisone was increased and extended (per pt).  She presents to day with persistent increased cough and congestion.  Persistent sob - with exertion.  Has oxygen, but only uses at night.  Since this has started, she has been having to use oxygen during the day.  No vomiting.  No diarrhea reported.    Past Medical History:  Diagnosis Date   Anemia    Anxiety    Chest pain    CHF (congestive heart failure) (HCC)    Constipation    DDD (degenerative disc disease), cervical    Depression    DVT (deep venous thrombosis) (HCC)    Dysphonia    Dyspnea    Fatty liver    Fatty liver    Headache    Hyperlipidemia    Hyperpiesia    Hypertension    Hypothyroidism    Interstitial cystitis    Left ventricular dysfunction    Lymphedema    Nephrolithiasis    Obstructive sleep apnea    Osteoarthritis    knees/cervical and lumbar spine   Pulmonary hypertension (HCC)    Pulmonary nodules    followed by Dr Raul Del   Pure hypercholesterolemia    Renal cyst    right   Past Surgical History:  Procedure Laterality Date   ABDOMINAL HYSTERECTOMY     ovaries left in place   APPENDECTOMY     Back Surgeries     BACK SURGERY     BREAST REDUCTION SURGERY     3/99   CARDIAC CATHETERIZATION     cataracts Bilateral    CERVICAL SPINE SURGERY     ESOPHAGEAL MANOMETRY N/A 08/02/2015   Procedure: ESOPHAGEAL MANOMETRY (EM);   Surgeon: Josefine Class, MD;  Location: [facility];  Service: Endoscopy;  Laterality: N/A;   ESOPHAGOGASTRODUODENOSCOPY N/A 02/27/2015   Procedure: ESOPHAGOGASTRODUODENOSCOPY (EGD);  Surgeon: Hulen Luster, MD;  Location: [facility] ENDOSCOPY;  Service: Gastroenterology;  Laterality: N/A;   ESOPHAGOGASTRODUODENOSCOPY (EGD) WITH PROPOFOL N/A 10/30/2018   Procedure: ESOPHAGOGASTRODUODENOSCOPY (EGD) WITH PROPOFOL;  Surgeon: Lollie Sails, MD;  Location: [facility] ENDOSCOPY;  Service: Endoscopy;  Laterality: N/A;   EXCISIONAL HEMORRHOIDECTOMY     EYE SURGERY     FRACTURE SURGERY     HEMORRHOID SURGERY     HIP SURGERY  2013   Right hip surgery   JOINT REPLACEMENT     KNEE ARTHROSCOPY     left and right   ORIF FEMUR FRACTURE Right 08/07/2018   Procedure: OPEN REDUCTION INTERNAL FIXATION (ORIF) DISTAL FEMUR FRACTURE;  Surgeon: Hessie Knows, MD;  Location: [facility] ORS;  Service: Orthopedics;  Laterality: Right;   REDUCTION MAMMAPLASTY Bilateral YRS AGO   REPLACEMENT TOTAL KNEE Bilateral    rotator cuff surgery     blilateral   TONSILECTOMY/ADENOIDECTOMY WITH MYRINGOTOMY     VISCERAL ARTERY INTERVENTION N/A 02/25/2019  Procedure: VISCERAL ARTERY INTERVENTION;  Surgeon: Algernon Huxley, MD;  Location: [place] [facility];  Service: Cardiovascular;  Laterality: N/A;   Family History  Problem Relation Age of Onset   Heart disease Mother    Stroke Mother    Hypertension Mother    Heart disease Father        myocardial infarction age 45   Breast cancer Neg Hx    Social History   Socioeconomic History   Marital status: Widowed    Spouse name: Not on file   Number of children: 2   Years of education: 73   Highest education level: 12th grade  Occupational History    Comment: insurance agency  Tobacco Use   Smoking status: Never   Smokeless tobacco: Never  Vaping Use   Vaping Use: Never used  Substance and Sexual Activity   Alcohol use: No    Alcohol/week: 0.0 standard drinks of alcohol    Drug use: Never   Sexual activity: Not Currently  Other Topics Concern   Not on file  Social History Narrative   Not on file   Social Determinants of Health   Financial Resource Strain: Low Risk  (03/09/2021)   Overall Financial Resource Strain (CARDIA)    Difficulty of Paying Living Expenses: Not hard at all  Food Insecurity: No Food Insecurity (11/15/2021)   Hunger Vital Sign    Worried About Running Out of Food in the Last Year: Never true    [place] in the Last Year: Never true  Transportation Needs: Unmet Transportation Needs (07/10/2022)   PRAPARE - Transportation    Lack of Transportation (Medical): Yes    Lack of Transportation (Non-Medical): Yes  Physical Activity: Inactive (03/09/2021)   Exercise Vital Sign    Days of Exercise per Week: 0 days    Minutes of Exercise per Session: 0 min  Stress: No Stress Concern Present (03/09/2021)   [place]    Feeling of Stress : Only a little  Social Connections: Socially Isolated (11/15/2021)   Social Connection and Isolation Panel [NHANES]    Frequency of Communication with Friends and Family: Three times a week    Frequency of Social Gatherings with Friends and Family: Twice a week    Attends Religious Services: Never    Marine scientist or Organizations: No    Attends Archivist Meetings: Never    Marital Status: Widowed     Review of Systems  Constitutional:  Negative for appetite change and fever.  HENT:  Negative for congestion and sinus pressure.   Respiratory:  Positive for cough, shortness of breath and wheezing. Negative for chest tightness.   Cardiovascular:  Negative for chest pain and palpitations.       No increased leg swelling.    Gastrointestinal:  Negative for diarrhea, nausea and vomiting.  Genitourinary:  Negative for difficulty urinating and dysuria.  Musculoskeletal:  Negative for joint swelling and myalgias.  Skin:   Negative for color change and rash.  Neurological:  Negative for dizziness and headaches.  Psychiatric/Behavioral:  Negative for agitation and dysphoric mood.        Objective:     There were no vitals taken for this visit. Wt Readings from Last 3 Encounters:  07/11/22 174 lb (78.9 kg)  07/07/22 180 lb (81.6 kg)  06/27/22 179 lb 3.2 oz (81.3 kg)    Physical Exam Vitals reviewed.  Constitutional:  Comments: Appears not to feel well.   HENT:     Head: Normocephalic and atraumatic.     Right Ear: External ear normal.     Left Ear: External ear normal.  Eyes:     General: No scleral icterus.       Right eye: No discharge.        Left eye: No discharge.     Conjunctiva/sclera: Conjunctivae normal.  Neck:     Thyroid: No thyromegaly.  Cardiovascular:     Rate and Rhythm: Normal rate and regular rhythm.  Pulmonary:     Comments: Increased rhonchi bilateral.  Some increased cough.  Abdominal:     General: Bowel sounds are normal.     Palpations: Abdomen is soft.     Tenderness: There is no abdominal tenderness.  Musculoskeletal:        General: No tenderness.     Cervical back: Neck supple. No tenderness.     Comments: No increased swelling.   Lymphadenopathy:     Cervical: No cervical adenopathy.  Skin:    Findings: No erythema or rash.  Neurological:     Mental Status: She is alert.  Psychiatric:        Mood and Affect: Mood normal.        Behavior: Behavior normal.      Outpatient Encounter Medications as of 07/11/2022  Medication Sig   albuterol (VENTOLIN HFA) 108 (90 Base) MCG/ACT inhaler INHALE 2 PUFFS INTO THE LUNGS EVERY 6 HOURS AS NEEDED FOR WHEEZING OR SHORTNESS OF BREATH   aluminum-magnesium hydroxide-simethicone (MAALOX) 200-200-20 MG/5ML SUSP Take 15 mLs by mouth 4 (four) times daily -  before meals and at bedtime.    amLODipine (NORVASC) 10 MG tablet TAKE 1 TABLET BY MOUTH DAILY   amoxicillin-clavulanate (AUGMENTIN) 875-125 MG tablet Take 1 tablet  by mouth 2 (two) times daily for 5 days.   aspirin 81 MG EC tablet Take by mouth.   atorvastatin (LIPITOR) 20 MG tablet TAKE 1 TABLET BY MOUTH AT BEDTIME   benazepril (LOTENSIN) 40 MG tablet TAKE 1 TABLET BY MOUTH DAILY   betamethasone dipropionate 0.05 % cream Apply 1 application topically 2 (two) times daily.   busPIRone (BUSPAR) 10 MG tablet Take 10 mg by mouth 2 (two) times daily.    calcium citrate-vitamin D (CITRACAL+D) 315-200 MG-UNIT tablet Take 1 tablet by mouth daily.   cetirizine (ZYRTEC) 10 MG tablet Take 1 tablet (10 mg total) by mouth daily.   Cholecalciferol (VITAMIN D3) 1000 UNITS CAPS Take 1 capsule by mouth daily.   clobetasol cream (TEMOVATE) 7.03 % Apply 1 application topically See admin instructions. Apply to affected areas of body 1 - 2 times daily as needed for itchy bumps. Avoid applying to face, groin, and axilla. Use as directed.   cyanocobalamin (VITAMIN B12) 1000 MCG/ML injection Inject 1 mL (1,000 mcg total) into the muscle every 3 (three) months.   Diclofenac Sodium 3 % GEL Apply topically 2 (two) times daily.   Docusate Sodium (DSS) 100 MG CAPS Take by mouth.   DULoxetine (CYMBALTA) 60 MG capsule Take 60 mg by mouth daily.   ELMIRON 100 MG capsule Take 100 mg by mouth 3 (three) times daily.   Fluocinolone Acetonide 0.01 % OIL Apply 1-2 drops into ears once to twice daily as needed.   fluocinonide (LIDEX) 0.05 % external solution Apply topically 2 (two) times daily. Apply drops to scalp for itch   furosemide (LASIX) 20 MG tablet TAKE 1 TABLET BY  MOUTH DAILY AS NEEDED   gabapentin (NEURONTIN) 300 MG capsule Take two tablets tid (in am, midday and q hs).   HYDROcodone-acetaminophen (NORCO) 10-325 MG tablet Take 1 tablet by mouth 3 (three) times daily.   Infant Care Products [facility]) OINT Apply topically as directed (as discussed).   ipratropium (ATROVENT) 0.03 % nasal spray Place 2 sprays into both nostrils every 12 (twelve) hours.   ketoconazole (NIZORAL) 2 %  shampoo Apply 1 application topically See admin instructions. apply three times per week, massage into scalp and leave in for 10 minutes before rinsing out   LINZESS 145 MCG CAPS capsule Take 145 mcg by mouth daily.   magnesium oxide (MAG-OX) 400 MG tablet Take 1 tablet (400 mg total) by mouth daily.   meloxicam (MOBIC) 15 MG tablet Take 15 mg by mouth daily.   methocarbamol (ROBAXIN) 500 MG tablet Take 500 mg by mouth 3 (three) times daily. As needed   metoprolol succinate (TOPROL-XL) 50 MG 24 hr tablet TAKE 1 TABLET BY MOUTH DAILY. TAKE WITH OR IMMEDIATELY FOLLOWING A MEAL.   montelukast (SINGULAIR) 10 MG tablet 10 mg once daily as needed   morphine (MS CONTIN) 60 MG 12 hr tablet Take 1 tablet (60 mg total) by mouth 2 (two) times daily.   mupirocin ointment (BACTROBAN) 2 % Apply 1 application topically 2 (two) times daily.   mupirocin ointment (BACTROBAN) 2 % Apply 1 application topically daily. Apply to left knee until healed   Naldemedine Tosylate 0.2 MG TABS Take 0.2 mg by mouth daily.   nystatin (MYCOSTATIN/NYSTOP) powder Apply 1 application. topically 2 (two) times daily as needed.   nystatin cream (MYCOSTATIN) Apply 1 Application topically 2 (two) times daily.   polyethylene glycol powder (GLYCOLAX/MIRALAX) 17 GM/SCOOP powder MIX 17 GRAMS AS MARKED ON BOTTLE TOP IN 8 OUNCES OF WATER AND DRINK ONCE A DAY AS DIRECTED.   predniSONE (DELTASONE) 50 MG tablet Take 1 pill daily for 5 days.   RABEprazole (ACIPHEX) 20 MG tablet TAKE 1 TABLET BY MOUTH TWICE A DAY BEFORE A MEAL   senna (SENOKOT) 8.6 MG TABS tablet Take 1 tablet by mouth 2 (two) times daily.   Simethicone (GAS-X PO) Take 2 tablets by mouth 2 (two) times daily.   sodium chloride (OCEAN) 0.65 % nasal spray Place 1 spray into the nose as needed.   SYNTHROID 100 MCG tablet Take 1 tablet (100 mcg total) by mouth daily.   SYRINGE-NEEDLE, DISP, 3 ML (BD SAFETYGLIDE SYRINGE/NEEDLE) 25G X 1" 3 ML MISC Use the needle for IM injection once  every 3 months.   triamcinolone ointment (KENALOG) 0.1 % APPLY TWICE DAILY TO BITES AND RASH UNTIL FLAT AND SMOOTH **DO NOT APPLY TO FACE**   No facility-administered encounter medications on file as of 07/11/2022.     Lab Results  Component Value Date   WBC 6.5 07/11/2022   HGB 10.2 (L) 07/11/2022   HCT 31.4 (L) 07/11/2022   PLT 188 07/11/2022   GLUCOSE 209 (H) 07/11/2022   CHOL 143 01/21/2022   TRIG 108.0 01/21/2022   HDL 57.90 01/21/2022   LDLCALC 63 01/21/2022   ALT 18 07/11/2022   AST 25 07/11/2022   NA 139 07/11/2022   K 3.7 07/11/2022   CL 99 07/11/2022   CREATININE 0.70 07/11/2022   BUN 20 07/11/2022   CO2 33 (H) 07/11/2022   TSH 4.63 01/21/2022   INR 0.89 08/06/2018   HGBA1C 6.0 10/19/2014    CT Chest Wo Contrast  Result  Date: 07/07/2022 CLINICAL DATA:  Respiratory illness, nondiagnostic xray, productive cough EXAM: CT CHEST WITHOUT CONTRAST TECHNIQUE: Multidetector CT imaging of the chest was performed following the standard protocol without IV contrast. RADIATION DOSE REDUCTION: This exam was performed according to the departmental dose-optimization program which includes automated exposure control, adjustment of the mA and/or kV according to patient size and/or use of iterative reconstruction technique. COMPARISON:  CT 12/29/2019 FINDINGS: Cardiovascular: Cardiomegaly. No pericardial effusion. Thoracic aorta is nonaneurysmal. Scattered atherosclerotic vascular calcifications of the aorta and coronary arteries. Mediastinum/Nodes: Multiple mildly prominent mediastinal lymph nodes including 10 mm precarinal node (series 2, image 60) lymph nodes have minimally increased in size compared to the previous CT from 2021. No axillary lymphadenopathy. Evaluation of the hilar structures is limited in the absence of intravenous contrast. Within this limitation, no obvious hilar adenopathy or mass is identified. Thyroid, trachea, and esophagus demonstrate no significant findings.  Lungs/Pleura: Multifocal airspace consolidations within the right upper lobe, predominantly ground-glass attenuation. Background of mosaic attenuation of the upper lung fields. Redemonstration of scattered small pulmonary nodules within the right lung including 7 mm posterior right subpleural nodule (series 3, image 103) and 6 mm peripheral right upper lobe nodule (series 3, image 58). Nodules have minimally increased by 1-2 mm compared to the previous study. No pleural effusion or pneumothorax. Upper Abdomen: Stable right adrenal myelolipoma containing macroscopic fat, benign. No follow-up imaging recommended. No new or acute findings within the included upper abdomen. Musculoskeletal: Bones are demineralized. No new or acute bony findings. Multilevel thoracic spondylosis. Severe bilateral glenohumeral arthropathy. Spinal stimulator leads remain in place within the midthoracic spine. Rounded 1.7 cm hyperdense nodule within the cutaneous and superficial subcutaneous soft tissues of the upper left back, new from prior (series 2, image 19) IMPRESSION: 1. Multifocal airspace consolidations within the right upper lobe, predominantly ground-glass attenuation, compatible with multifocal pneumonia, including atypical/viral etiologies. 2. Redemonstration of scattered small pulmonary nodules within the right lung, minimally increased compared to the previous study. Nodules are almost certainly benign and some of which may reflect reactive intrapulmonary lymph nodes. 3. Mildly prominent mediastinal lymph nodes, minimally increased in size compared to the previous study, likely reactive. 4. New rounded 1.7 cm hyperdense nodule within the cutaneous and superficial subcutaneous soft tissues of the upper left back, possibly sebaceous or epidermoid cyst. Correlate with physical exam. 5. Cardiomegaly with aortic and coronary artery atherosclerosis (ICD10-I70.0). Electronically Signed   By: Davina Poke D.O.   On: 07/07/2022  12:01   DG Chest Port 1 View  Result Date: 07/07/2022 CLINICAL DATA:  Shortness of breath. EXAM: PORTABLE CHEST 1 VIEW COMPARISON:  01/24/2021 FINDINGS: 1025 hours. Low volume film. Stable asymmetric elevation right hemidiaphragm. Hazy opacity over the upper lungs is likely superimposed soft tissue. SIRT cardiomegaly Interstitial markings are diffusely coarsened with chronic features. Bones are diffusely demineralized. Spinal stimulator device again noted over the thoracic spine. Telemetry leads overlie the chest. IMPRESSION: Low volume film without acute cardiopulmonary findings. Hazy opacity overlying the upper lungs bilaterally is probably superimposed soft tissue. Dedicated standing upright PA and lateral chest x-ray is recommended when patient is able. Electronically Signed   By: Misty Stanley M.D.   On: 07/07/2022 10:58       Assessment & Plan:   Problem List Items Addressed This Visit     Chronic respiratory failure with hypoxia ([place])    Normally uses oxygen at night only.  With recent infection, has required oxygen during the day.  Oxygen saturation noted to  be 88% with minimal exertion in exam room.  Discussed need for further w/up, evaluation and treatment.  Referred to ER as outlined.       Cough - Primary    Persistent increased cough/congestion and sob despite antibiotics and prednisone.  Increased sob with minimal exertion on exam.  Oxygen saturation decreased to 88% with minimal exertion and became more sob.  She is on oxygen at home, but only uses at night.  Has required oxygen during the day since she developed the increased cough and congestion.  Previous xray - pneumonia.  Respiratory swab - positive rhinovirus.  Given above persistent symptoms and worsening sob despite treatment, I do feel she warrants further evaluation - ER.  ER notified.        Fatigue    Increased fatigue - related to current infection.  Refer to ER as outlined       Hypertension    Blood pressure  elevated.  Feel related to above.  Continue treatment for infection.  Hold on changing medication.  Follow.       SOB (shortness of breath)    Recently evaluated in ER.  Diagnosed with pneumonia.  Persistent symptoms as outlined. Persistent increased cough/congestion and sob despite antibiotics and prednisone.  Increased sob with minimal exertion on exam.  Oxygen saturation decreased to 88% with minimal exertion and became more sob.  She is on oxygen at home, but only uses at night.  Has required oxygen during the day since she developed the increased cough and congestion.  Previous xray - pneumonia.  Respiratory swab - positive rhinovirus.  Given above persistent symptoms and worsening sob despite treatment, I do feel she warrants further evaluation - ER.  ER notified.          Einar Pheasant, MD

## 2022-07-12 ENCOUNTER — Encounter: Payer: Self-pay | Admitting: Family Medicine

## 2022-07-12 ENCOUNTER — Other Ambulatory Visit: Payer: Self-pay

## 2022-07-12 DIAGNOSIS — Z8249 Family history of ischemic heart disease and other diseases of the circulatory system: Secondary | ICD-10-CM | POA: Diagnosis not present

## 2022-07-12 DIAGNOSIS — I272 Pulmonary hypertension, unspecified: Secondary | ICD-10-CM | POA: Diagnosis present

## 2022-07-12 DIAGNOSIS — E039 Hypothyroidism, unspecified: Secondary | ICD-10-CM | POA: Diagnosis present

## 2022-07-12 DIAGNOSIS — I160 Hypertensive urgency: Secondary | ICD-10-CM | POA: Diagnosis present

## 2022-07-12 DIAGNOSIS — T1490XA Injury, unspecified, initial encounter: Secondary | ICD-10-CM | POA: Insufficient documentation

## 2022-07-12 DIAGNOSIS — J189 Pneumonia, unspecified organism: Secondary | ICD-10-CM | POA: Diagnosis present

## 2022-07-12 DIAGNOSIS — J129 Viral pneumonia, unspecified: Secondary | ICD-10-CM | POA: Diagnosis present

## 2022-07-12 DIAGNOSIS — Z87442 Personal history of urinary calculi: Secondary | ICD-10-CM | POA: Diagnosis not present

## 2022-07-12 DIAGNOSIS — F419 Anxiety disorder, unspecified: Secondary | ICD-10-CM | POA: Diagnosis present

## 2022-07-12 DIAGNOSIS — G4733 Obstructive sleep apnea (adult) (pediatric): Secondary | ICD-10-CM | POA: Diagnosis present

## 2022-07-12 DIAGNOSIS — F32A Depression, unspecified: Secondary | ICD-10-CM | POA: Diagnosis present

## 2022-07-12 DIAGNOSIS — I110 Hypertensive heart disease with heart failure: Secondary | ICD-10-CM | POA: Diagnosis present

## 2022-07-12 DIAGNOSIS — M47812 Spondylosis without myelopathy or radiculopathy, cervical region: Secondary | ICD-10-CM | POA: Diagnosis present

## 2022-07-12 DIAGNOSIS — I872 Venous insufficiency (chronic) (peripheral): Secondary | ICD-10-CM | POA: Diagnosis present

## 2022-07-12 DIAGNOSIS — I5033 Acute on chronic diastolic (congestive) heart failure: Secondary | ICD-10-CM | POA: Diagnosis present

## 2022-07-12 DIAGNOSIS — M170 Bilateral primary osteoarthritis of knee: Secondary | ICD-10-CM | POA: Diagnosis present

## 2022-07-12 DIAGNOSIS — E7800 Pure hypercholesterolemia, unspecified: Secondary | ICD-10-CM | POA: Diagnosis present

## 2022-07-12 DIAGNOSIS — Z20822 Contact with and (suspected) exposure to covid-19: Secondary | ICD-10-CM | POA: Diagnosis present

## 2022-07-12 DIAGNOSIS — Z90710 Acquired absence of both cervix and uterus: Secondary | ICD-10-CM | POA: Diagnosis not present

## 2022-07-12 DIAGNOSIS — I350 Nonrheumatic aortic (valve) stenosis: Secondary | ICD-10-CM

## 2022-07-12 DIAGNOSIS — J9621 Acute and chronic respiratory failure with hypoxia: Secondary | ICD-10-CM | POA: Diagnosis present

## 2022-07-12 DIAGNOSIS — B9789 Other viral agents as the cause of diseases classified elsewhere: Secondary | ICD-10-CM | POA: Diagnosis present

## 2022-07-12 DIAGNOSIS — K760 Fatty (change of) liver, not elsewhere classified: Secondary | ICD-10-CM | POA: Diagnosis present

## 2022-07-12 DIAGNOSIS — Z823 Family history of stroke: Secondary | ICD-10-CM | POA: Diagnosis not present

## 2022-07-12 DIAGNOSIS — J440 Chronic obstructive pulmonary disease with acute lower respiratory infection: Secondary | ICD-10-CM | POA: Diagnosis present

## 2022-07-12 DIAGNOSIS — G8929 Other chronic pain: Secondary | ICD-10-CM | POA: Diagnosis present

## 2022-07-12 LAB — BRAIN NATRIURETIC PEPTIDE: B Natriuretic Peptide: 473.1 pg/mL — ABNORMAL HIGH (ref 0.0–100.0)

## 2022-07-12 MED ORDER — SODIUM CHLORIDE 0.9 % IV SOLN: INTRAVENOUS | Status: AC

## 2022-07-12 MED ORDER — GABAPENTIN 300 MG PO CAPS: 600.0000 mg | ORAL_CAPSULE | Freq: Three times a day (TID) | ORAL | Status: DC

## 2022-07-12 MED ORDER — MORPHINE SULFATE ER 15 MG PO TBCR: 60.0000 mg | EXTENDED_RELEASE_TABLET | Freq: Two times a day (BID) | ORAL | Status: DC

## 2022-07-12 MED ORDER — LINACLOTIDE 145 MCG PO CAPS
145.0000 ug | ORAL_CAPSULE | Freq: Every day | ORAL | Status: DC
  Filled 2022-07-12: qty 1

## 2022-07-12 MED ORDER — AMLODIPINE BESYLATE 10 MG PO TABS
10.0000 mg | ORAL_TABLET | Freq: Every day | ORAL | Status: DC
  Administered 2022-07-12 – 2022-07-15 (×4): 10 mg via ORAL
  Filled 2022-07-12: qty 1
  Filled 2022-07-12: qty 2
  Filled 2022-07-12 (×2): qty 1

## 2022-07-12 MED ORDER — GABAPENTIN 300 MG PO CAPS
300.0000 mg | ORAL_CAPSULE | Freq: Three times a day (TID) | ORAL | Status: DC
  Administered 2022-07-12: 300 mg via ORAL
  Filled 2022-07-12: qty 1

## 2022-07-12 MED ORDER — MAGNESIUM OXIDE 400 MG PO TABS
400.0000 mg | ORAL_TABLET | Freq: Every day | ORAL | Status: DC
  Administered 2022-07-13 – 2022-07-15 (×3): 400 mg via ORAL
  Filled 2022-07-12 (×6): qty 1

## 2022-07-12 MED ORDER — GABAPENTIN 300 MG PO CAPS
600.0000 mg | ORAL_CAPSULE | Freq: Three times a day (TID) | ORAL | Status: DC
  Administered 2022-07-12 – 2022-07-15 (×8): 600 mg via ORAL
  Filled 2022-07-12 (×9): qty 2

## 2022-07-12 MED ORDER — DULOXETINE HCL 30 MG PO CPEP
60.0000 mg | ORAL_CAPSULE | Freq: Every day | ORAL | Status: DC
  Administered 2022-07-12 – 2022-07-15 (×4): 60 mg via ORAL
  Filled 2022-07-12: qty 1
  Filled 2022-07-12 (×3): qty 2

## 2022-07-12 MED ORDER — BENAZEPRIL HCL 20 MG PO TABS
40.0000 mg | ORAL_TABLET | Freq: Every day | ORAL | Status: DC
  Administered 2022-07-12 – 2022-07-15 (×4): 40 mg via ORAL
  Filled 2022-07-12 (×4): qty 2

## 2022-07-12 MED ORDER — NALDEMEDINE TOSYLATE 0.2 MG PO TABS: 0.2000 mg | ORAL_TABLET | Freq: Every day | ORAL | Status: DC

## 2022-07-12 MED ORDER — BUSPIRONE HCL 10 MG PO TABS
10.0000 mg | ORAL_TABLET | Freq: Two times a day (BID) | ORAL | Status: DC
  Administered 2022-07-12: 10 mg via ORAL
  Filled 2022-07-12: qty 2

## 2022-07-12 MED ORDER — HYDRALAZINE HCL 20 MG/ML IJ SOLN: 5.0000 mg | INTRAMUSCULAR | Status: DC | PRN

## 2022-07-12 MED ORDER — ALBUTEROL SULFATE (2.5 MG/3ML) 0.083% IN NEBU: 2.5000 mg | INHALATION_SOLUTION | Freq: Four times a day (QID) | RESPIRATORY_TRACT | Status: DC | PRN

## 2022-07-12 MED ORDER — IPRATROPIUM BROMIDE 0.03 % NA SOLN
2.0000 | Freq: Two times a day (BID) | NASAL | Status: DC
  Administered 2022-07-13 – 2022-07-15 (×5): 2 via NASAL
  Filled 2022-07-12: qty 30

## 2022-07-12 MED ORDER — ASPIRIN 81 MG PO TBEC
81.0000 mg | DELAYED_RELEASE_TABLET | Freq: Every day | ORAL | Status: DC
  Administered 2022-07-12 – 2022-07-15 (×4): 81 mg via ORAL
  Filled 2022-07-12 (×4): qty 1

## 2022-07-12 MED ORDER — SODIUM CHLORIDE 0.9 % IV SOLN
1.0000 g | Freq: Once | INTRAVENOUS | Status: AC
  Administered 2022-07-12: 1 g via INTRAVENOUS
  Filled 2022-07-12: qty 10

## 2022-07-12 MED ORDER — SODIUM CHLORIDE 0.9 % IV SOLN: 2.0000 g | INTRAVENOUS | Status: DC

## 2022-07-12 MED ORDER — SODIUM CHLORIDE 0.9 % IV SOLN
500.0000 mg | INTRAVENOUS | Status: DC
  Administered 2022-07-13: 500 mg via INTRAVENOUS
  Filled 2022-07-12: qty 500

## 2022-07-12 MED ORDER — BUSPIRONE HCL 10 MG PO TABS
10.0000 mg | ORAL_TABLET | Freq: Three times a day (TID) | ORAL | Status: DC
  Administered 2022-07-12 – 2022-07-15 (×8): 10 mg via ORAL
  Filled 2022-07-12 (×8): qty 1

## 2022-07-12 MED ORDER — MORPHINE SULFATE ER 15 MG PO TBCR
60.0000 mg | EXTENDED_RELEASE_TABLET | Freq: Two times a day (BID) | ORAL | Status: DC
  Administered 2022-07-12 – 2022-07-15 (×6): 60 mg via ORAL
  Filled 2022-07-12 (×6): qty 4

## 2022-07-12 MED ORDER — SODIUM CHLORIDE 0.9 % IV SOLN
500.0000 mg | Freq: Once | INTRAVENOUS | Status: AC
  Administered 2022-07-12: 500 mg via INTRAVENOUS
  Filled 2022-07-12: qty 5

## 2022-07-12 MED ORDER — TRAZODONE HCL 50 MG PO TABS
50.0000 mg | ORAL_TABLET | Freq: Every evening | ORAL | Status: DC | PRN
  Administered 2022-07-12 – 2022-07-14 (×3): 50 mg via ORAL
  Filled 2022-07-12 (×3): qty 1

## 2022-07-12 MED ORDER — ALUM & MAG HYDROXIDE-SIMETH 200-200-20 MG/5ML PO SUSP
15.0000 mL | Freq: Three times a day (TID) | ORAL | Status: DC
  Administered 2022-07-12 – 2022-07-15 (×10): 15 mL via ORAL
  Filled 2022-07-12 (×10): qty 30

## 2022-07-12 MED ORDER — ACETAMINOPHEN 325 MG PO TABS
650.0000 mg | ORAL_TABLET | Freq: Four times a day (QID) | ORAL | Status: DC | PRN
  Administered 2022-07-12: 650 mg via ORAL
  Filled 2022-07-12: qty 2

## 2022-07-12 MED ORDER — HYDROCODONE-ACETAMINOPHEN 10-325 MG PO TABS
1.0000 | ORAL_TABLET | Freq: Three times a day (TID) | ORAL | Status: DC
  Administered 2022-07-12 – 2022-07-14 (×7): 1 via ORAL
  Filled 2022-07-12 (×10): qty 1

## 2022-07-12 MED ORDER — CLOBETASOL PROPIONATE 0.05 % EX CREA: 1.0000 | TOPICAL_CREAM | Freq: Every day | CUTANEOUS | Status: DC | PRN

## 2022-07-12 MED ORDER — VITAMIN D 25 MCG (1000 UNIT) PO TABS
1000.0000 [IU] | ORAL_TABLET | Freq: Every day | ORAL | Status: DC
  Administered 2022-07-13 – 2022-07-15 (×3): 1000 [IU] via ORAL
  Filled 2022-07-12 (×3): qty 1

## 2022-07-12 MED ORDER — PANTOPRAZOLE SODIUM 40 MG PO TBEC
40.0000 mg | DELAYED_RELEASE_TABLET | Freq: Every day | ORAL | Status: DC
  Administered 2022-07-12 – 2022-07-15 (×4): 40 mg via ORAL
  Filled 2022-07-12 (×4): qty 1

## 2022-07-12 MED ORDER — KETOCONAZOLE 2 % EX SHAM
1.0000 | MEDICATED_SHAMPOO | CUTANEOUS | Status: DC
  Filled 2022-07-12: qty 120

## 2022-07-12 MED ORDER — OYSTER SHELL CALCIUM/D3 500-5 MG-MCG PO TABS
1.0000 | ORAL_TABLET | Freq: Every day | ORAL | Status: DC
  Administered 2022-07-13 – 2022-07-15 (×3): 1 via ORAL
  Filled 2022-07-12 (×3): qty 1

## 2022-07-12 MED ORDER — ENOXAPARIN SODIUM 40 MG/0.4ML IJ SOSY
40.0000 mg | PREFILLED_SYRINGE | INTRAMUSCULAR | Status: DC
  Administered 2022-07-12 – 2022-07-15 (×3): 40 mg via SUBCUTANEOUS
  Filled 2022-07-12 (×4): qty 0.4

## 2022-07-12 MED ORDER — MORPHINE SULFATE ER 15 MG PO TBCR: 30.0000 mg | EXTENDED_RELEASE_TABLET | Freq: Two times a day (BID) | ORAL | Status: DC

## 2022-07-12 MED ORDER — SODIUM CHLORIDE 0.9 % IV SOLN
2.0000 g | INTRAVENOUS | Status: DC
  Administered 2022-07-13: 2 g via INTRAVENOUS
  Filled 2022-07-12: qty 2

## 2022-07-12 MED ORDER — ACETAMINOPHEN 650 MG RE SUPP: 650.0000 mg | Freq: Four times a day (QID) | RECTAL | Status: DC | PRN

## 2022-07-12 MED ORDER — ONDANSETRON HCL 4 MG/2ML IJ SOLN: 4.0000 mg | Freq: Four times a day (QID) | INTRAMUSCULAR | Status: DC | PRN

## 2022-07-12 MED ORDER — METOPROLOL SUCCINATE ER 50 MG PO TB24
50.0000 mg | ORAL_TABLET | Freq: Every day | ORAL | Status: DC
  Administered 2022-07-12 – 2022-07-13 (×2): 50 mg via ORAL
  Filled 2022-07-12 (×4): qty 1

## 2022-07-12 MED ORDER — SIMETHICONE 80 MG PO CHEW
80.0000 mg | CHEWABLE_TABLET | Freq: Two times a day (BID) | ORAL | Status: DC
  Administered 2022-07-12 – 2022-07-15 (×6): 80 mg via ORAL
  Filled 2022-07-12 (×6): qty 1

## 2022-07-12 MED ORDER — ATORVASTATIN CALCIUM 20 MG PO TABS
20.0000 mg | ORAL_TABLET | Freq: Every day | ORAL | Status: DC
  Administered 2022-07-12 – 2022-07-14 (×3): 20 mg via ORAL
  Filled 2022-07-12 (×3): qty 1

## 2022-07-12 MED ORDER — LEVOTHYROXINE SODIUM 100 MCG PO TABS
100.0000 ug | ORAL_TABLET | Freq: Every day | ORAL | Status: DC
  Administered 2022-07-12 – 2022-07-15 (×3): 100 ug via ORAL
  Filled 2022-07-12: qty 2
  Filled 2022-07-12 (×2): qty 1

## 2022-07-12 MED ORDER — ONDANSETRON HCL 4 MG PO TABS: 4.0000 mg | ORAL_TABLET | Freq: Four times a day (QID) | ORAL | Status: DC | PRN

## 2022-07-12 NOTE — Assessment & Plan Note (Signed)
Continue levothyroxine

## 2022-07-12 NOTE — H&P (Addendum)
History and Physical    Patient: Kimberly Roy TDD:[id number] DOB: 11-18-1930 DOA: 07/11/2022 DOS: the patient was seen and examined on 07/12/2022 PCP: Einar Pheasant, MD  Patient coming from: Home  Chief Complaint:  Chief Complaint  Patient presents with   Rattle in Chest    HPI: Kimberly Roy is a 88 y.o. female with medical history significant for COPD, pulmonary hypertension, OSA, diastolic CHF, with chronic exertional dyspnea on as needed O2 at 2 L, mild aortic stenosis, chronic anemia, HTN, lower extremity venous stasis, who presents to the ED for shortness of breath.  Patient was seen in the urgent care on 11/3 for cough and shortness of breath and treated with doxycycline, prednisone and Tessalon Perles.  Due to insufficient improvement she went to the ED on 11/5.  She had a CT chest that showed a multifocal pneumonia on the right side but other work-up was unremarkable with normal WBC and negative procalcitonin.  Augmentin was added to her regimen and she was discharged, however she returns due to a persistent wet cough and need to use O2 continuously for relief in her shortness of breath instead of as needed.  She denies chest pain, fever or chills. ED course and data review: Afebrile with pulse mostly controlled but as high as 110, respirations up to 24 but mostly below 20.  BP was elevated to 219/88 on arrival.  O2 sat was 89% on 2 L.  Labs showed a normal WBC.  COVID and flu negative.  Hemoglobin 10.2 and glucose 209.She labs chest x-ray showed improvement in patchy right upper lobe airspace disease.  CT head was done as daughter was concerned about confusion however it showed no acute findings. Patient was started on Rocephin and azithromycin as well as DuoNebs x2 and also treated with IV hydralazine for blood pressure.  Hospitalist consulted for admission for CAP failing outpatient treatment   Review of Systems: As mentioned in the history of present illness. All other systems reviewed  and are negative.  Past Medical History:  Diagnosis Date   Anemia    Anxiety    Chest pain    CHF (congestive heart failure) (HCC)    Constipation    DDD (degenerative disc disease), cervical    Depression    DVT (deep venous thrombosis) (HCC)    Dysphonia    Dyspnea    Fatty liver    Fatty liver    Headache    Hyperlipidemia    Hyperpiesia    Hypertension    Hypothyroidism    Interstitial cystitis    Left ventricular dysfunction    Lymphedema    Nephrolithiasis    Obstructive sleep apnea    Osteoarthritis    knees/cervical and lumbar spine   Pulmonary hypertension (HCC)    Pulmonary nodules    followed by Dr Raul Del   Pure hypercholesterolemia    Renal cyst    right   Past Surgical History:  Procedure Laterality Date   ABDOMINAL HYSTERECTOMY     ovaries left in place   APPENDECTOMY     Back Surgeries     BACK SURGERY     BREAST REDUCTION SURGERY     3/99   CARDIAC CATHETERIZATION     cataracts Bilateral    CERVICAL SPINE SURGERY     ESOPHAGEAL MANOMETRY N/A 08/02/2015   Procedure: ESOPHAGEAL MANOMETRY (EM);  Surgeon: Josefine Class, MD;  Location: [facility] ENDOSCOPY;  Service: Endoscopy;  Laterality: N/A;   ESOPHAGOGASTRODUODENOSCOPY N/A 02/27/2015  Procedure: ESOPHAGOGASTRODUODENOSCOPY (EGD);  Surgeon: Hulen Luster, MD;  Location: [facility] ENDOSCOPY;  Service: Gastroenterology;  Laterality: N/A;   ESOPHAGOGASTRODUODENOSCOPY (EGD) WITH PROPOFOL N/A 10/30/2018   Procedure: ESOPHAGOGASTRODUODENOSCOPY (EGD) WITH PROPOFOL;  Surgeon: Lollie Sails, MD;  Location: [facility] ENDOSCOPY;  Service: Endoscopy;  Laterality: N/A;   EXCISIONAL HEMORRHOIDECTOMY     EYE SURGERY     FRACTURE SURGERY     HEMORRHOID SURGERY     HIP SURGERY  2013   Right hip surgery   JOINT REPLACEMENT     KNEE ARTHROSCOPY     left and right   ORIF FEMUR FRACTURE Right 08/07/2018   Procedure: OPEN REDUCTION INTERNAL FIXATION (ORIF) DISTAL FEMUR FRACTURE;  Surgeon: Hessie Knows, MD;  Location:  [facility] ORS;  Service: Orthopedics;  Laterality: Right;   REDUCTION MAMMAPLASTY Bilateral YRS AGO   REPLACEMENT TOTAL KNEE Bilateral    rotator cuff surgery     blilateral   TONSILECTOMY/ADENOIDECTOMY WITH MYRINGOTOMY     VISCERAL ARTERY INTERVENTION N/A 02/25/2019   Procedure: VISCERAL ARTERY INTERVENTION;  Surgeon: Algernon Huxley, MD;  Location: [place] [facility] LAB;  Service: Cardiovascular;  Laterality: N/A;   Social History:  reports that she has never smoked. She has never used smokeless tobacco. She reports that she does not drink alcohol and does not use drugs.  Allergies  Allergen Reactions   Lyrica [Pregabalin] Swelling   Omnicef [Cefdinir] Diarrhea and Nausea And Vomiting   Atarax [Hydroxyzine]     jittery   Dicyclomine Other (See Comments)    Abdominal bloating    Hydroxyzine Hcl     jittery   Levaquin [Levofloxacin] Swelling   Nitrofurantoin Diarrhea   Nucynta Er [Tapentadol Hcl Er] Other (See Comments)    Severe constipation    Oxybutynin Other (See Comments)    Blurred vision   Zoloft [Sertraline Hcl]     Severe headache   Biaxin [Clarithromycin] Other (See Comments) and Rash    Pt does not remember Pt does not remember   Meloxicam Rash   Sertraline Nausea And Vomiting    Severe headache Severe headache Other reaction(s): Headache Severe headache   Sulfa Antibiotics Rash    Pt does not remember   Sulfasalazine Rash    Pt does not remember   Tape Rash    Durabond - redness   Tapentadol Other (See Comments) and Rash    _0    Venofer [Iron Sucrose] Rash    Family History  Problem Relation Age of Onset   Heart disease Mother    Stroke Mother    Hypertension Mother    Heart disease Father        myocardial infarction age 16   Breast cancer Neg Hx     Prior to Admission medications   Medication Sig Start Date End Date Taking? Authorizing Provider   doxycycline (VIBRAMYCIN) 100 MG capsule Take 1 capsule (100 mg total) by mouth 2 (two) times daily for 5 days. 07/11/22 07/16/22 Yes Nance Pear, MD  albuterol (VENTOLIN HFA) 108 (90 Base) MCG/ACT inhaler INHALE 2 PUFFS INTO THE LUNGS EVERY 6 HOURS AS NEEDED FOR WHEEZING OR SHORTNESS OF BREATH 07/09/22   Einar Pheasant, MD  aluminum-magnesium hydroxide-simethicone (MAALOX) 200-200-20 MG/5ML SUSP Take 15 mLs by mouth 4 (four) times daily -  before meals and at bedtime.     [provider]  amLODipine (NORVASC) 10 MG tablet TAKE 1 TABLET BY MOUTH DAILY 05/23/22   Nicki Reaper,  Randell Patient, MD  amoxicillin-clavulanate (AUGMENTIN) 875-125 MG tablet Take 1 tablet by mouth 2 (two) times daily for 5 days. 07/07/22 07/12/22  Rada Hay, MD  aspirin 81 MG EC tablet Take by mouth.    [provider]  atorvastatin (LIPITOR) 20 MG tablet TAKE 1 TABLET BY MOUTH AT BEDTIME 03/06/22   Einar Pheasant, MD  benazepril (LOTENSIN) 40 MG tablet TAKE 1 TABLET BY MOUTH DAILY 05/23/22   Einar Pheasant, MD  betamethasone dipropionate 0.05 % cream Apply 1 application topically 2 (two) times daily. 10/19/20   [provider]  busPIRone (BUSPAR) 10 MG tablet Take 10 mg by mouth 2 (two) times daily.  03/31/18   [provider]  calcium citrate-vitamin D (CITRACAL+D) 315-200 MG-UNIT tablet Take 1 tablet by mouth daily.    [provider]  cetirizine (ZYRTEC) 10 MG tablet Take 1 tablet (10 mg total) by mouth daily. 04/15/22   Scot Jun, FNP  Cholecalciferol (VITAMIN D3) 1000 UNITS CAPS Take 1 capsule by mouth daily.    [provider]  clobetasol cream (TEMOVATE) 6.01 % Apply 1 application topically See admin instructions. Apply to affected areas of body 1 - 2 times daily as needed for itchy bumps. Avoid applying to face, groin, and axilla. Use as directed. 07/30/21   Brendolyn Patty, MD  cyanocobalamin (VITAMIN B12) 1000 MCG/ML injection Inject 1 mL (1,000 mcg total) into  the muscle every 3 (three) months. 06/04/22   Cammie Sickle, MD  Diclofenac Sodium 3 % GEL Apply topically 2 (two) times daily. 12/20/19   [provider]  Docusate Sodium (DSS) 100 MG CAPS Take by mouth.    [provider]  DULoxetine (CYMBALTA) 60 MG capsule Take 60 mg by mouth daily. 02/20/22   [provider]  ELMIRON 100 MG capsule Take 100 mg by mouth 3 (three) times daily. 11/07/20   [provider]  Fluocinolone Acetonide 0.01 % OIL Apply 1-2 drops into ears once to twice daily as needed. 10/09/21   Brendolyn Patty, MD  fluocinonide (LIDEX) 0.05 % external solution Apply topically 2 (two) times daily. Apply drops to scalp for itch 07/30/21   Brendolyn Patty, MD  furosemide (LASIX) 20 MG tablet TAKE 1 TABLET BY MOUTH DAILY AS NEEDED 05/23/22   Einar Pheasant, MD  gabapentin (NEURONTIN) 300 MG capsule Take two tablets tid (in am, midday and q hs). 05/20/22   Einar Pheasant, MD  HYDROcodone-acetaminophen (NORCO) 10-325 MG tablet Take 1 tablet by mouth 3 (three) times daily. 08/10/18   Fritzi Mandes, MD  [facility]) OINT Apply topically as directed (as discussed). 03/27/22   Einar Pheasant, MD  ipratropium (ATROVENT) 0.03 % nasal spray Place 2 sprays into both nostrils every 12 (twelve) hours. 03/20/22   Einar Pheasant, MD  ketoconazole (NIZORAL) 2 % shampoo Apply 1 application topically See admin instructions. apply three times per week, massage into scalp and leave in for 10 minutes before rinsing out 07/30/21   Brendolyn Patty, MD  LINZESS 145 MCG CAPS capsule Take 145 mcg by mouth daily. 10/22/21   [provider]  magnesium oxide (MAG-OX) 400 MG tablet Take 1 tablet (400 mg total) by mouth daily. 09/22/20   Einar Pheasant, MD  meloxicam (MOBIC) 15 MG tablet Take 15 mg by mouth daily. 02/22/22   [provider]  methocarbamol (ROBAXIN) 500 MG tablet Take 500 mg by mouth 3 (three) times daily. As needed 03/06/21   [provider]  metoprolol succinate (  TOPROL-XL) 50 MG 24 hr tablet TAKE 1 TABLET BY MOUTH DAILY. TAKE WITH OR IMMEDIATELY FOLLOWING A MEAL. 05/23/22   Einar Pheasant, MD  montelukast (SINGULAIR) 10 MG tablet 10 mg once daily as needed 12/23/19   [provider]  morphine (MS CONTIN) 60 MG 12 hr tablet Take 1 tablet (60 mg total) by mouth 2 (two) times daily. 08/10/18   Fritzi Mandes, MD  mupirocin ointment (BACTROBAN) 2 % Apply 1 application topically 2 (two) times daily. 05/14/21   Einar Pheasant, MD  mupirocin ointment (BACTROBAN) 2 % Apply 1 application topically daily. Apply to left knee until healed 07/30/21   Brendolyn Patty, MD  Naldemedine Tosylate 0.2 MG TABS Take 0.2 mg by mouth daily.    [provider]  nystatin (MYCOSTATIN/NYSTOP) powder Apply 1 application. topically 2 (two) times daily as needed. 01/21/22   Einar Pheasant, MD  nystatin cream (MYCOSTATIN) Apply 1 Application topically 2 (two) times daily. 06/13/22   Einar Pheasant, MD  polyethylene glycol powder (GLYCOLAX/MIRALAX) 17 GM/SCOOP powder MIX 17 GRAMS AS MARKED ON BOTTLE TOP IN 8 OUNCES OF WATER AND DRINK ONCE A DAY AS DIRECTED. 02/05/19   Einar Pheasant, MD  predniSONE (DELTASONE) 50 MG tablet Take 1 pill daily for 5 days. 07/07/22   Rada Hay, MD  RABEprazole (ACIPHEX) 20 MG tablet TAKE 1 TABLET BY MOUTH TWICE A DAY BEFORE A MEAL 05/23/22   Einar Pheasant, MD  senna (SENOKOT) 8.6 MG TABS tablet Take 1 tablet by mouth 2 (two) times daily.    [provider]  Simethicone (GAS-X PO) Take 2 tablets by mouth 2 (two) times daily.    [provider]  sodium chloride (OCEAN) 0.65 % nasal spray Place 1 spray into the nose as needed.    [provider]  SYNTHROID 100 MCG tablet Take 1 tablet (100 mcg total) by mouth daily. 03/06/22   Einar Pheasant, MD  SYRINGE-NEEDLE, DISP, 3 ML (BD SAFETYGLIDE SYRINGE/NEEDLE) 25G X 1" 3 ML MISC Use the needle for IM injection once every 3 months.  06/04/22   Cammie Sickle, MD  triamcinolone ointment (KENALOG) 0.1 % APPLY TWICE DAILY TO BITES AND RASH UNTIL FLAT AND SMOOTH **DO NOT APPLY TO FACE** 07/16/21   Einar Pheasant, MD    Physical Exam: Vitals:   07/11/22 2150 07/11/22 2235 07/11/22 2345 07/12/22 0033  BP: (!) 186/81 (!) 195/92 (!) 172/63 (!) 203/73  Pulse: 78 100 64   Resp: 20 (!) 24 15   Temp:      TempSrc:      SpO2: 100% 97% 98%   Weight:       Physical Exam Vitals and nursing note reviewed.  Constitutional:      General: She is not in acute distress.    Comments: Frail-appearing elderly female  HENT:     Head: Normocephalic and atraumatic.  Cardiovascular:     Rate and Rhythm: Normal rate and regular rhythm.     Heart sounds: Normal heart sounds.  Pulmonary:     Effort: Tachypnea present.     Breath sounds: Rhonchi present.  Abdominal:     Palpations: Abdomen is soft.     Tenderness: There is no abdominal tenderness.  Musculoskeletal:     Comments: Thoracic kyphosis  Neurological:     Mental Status: Mental status is at baseline.     Labs on Admission: I have personally reviewed following labs and imaging studies  CBC: Recent Labs  Lab 07/07/22 1020 07/11/22 1558  WBC 8.8 6.5  HGB 9.8* 10.2*  HCT 30.8* 31.4*  MCV 102.0* 99.7  PLT 135* 161   Basic Metabolic Panel: Recent Labs  Lab 07/07/22 1020 07/11/22 1558  NA 138 139  K 3.3* 3.7  CL 103 99  CO2 28 33*  GLUCOSE 131* 209*  BUN 18 20  CREATININE 0.70 0.70  CALCIUM 9.1 9.3   GFR: Estimated Creatinine Clearance: 46.4 mL/min (by C-G formula based on SCr of 0.7 mg/dL). Liver Function Tests: Recent Labs  Lab 07/07/22 1020 07/11/22 1558  AST 30 25  ALT 12 18  ALKPHOS 68 70  BILITOT 0.9 0.6  PROT 6.8 6.8  ALBUMIN 3.7 3.7   No results for input(s): "LIPASE", "AMYLASE" in the last 168 hours. No results for input(s): "AMMONIA" in the last 168 hours. Coagulation Profile: No results for input(s): "INR", "PROTIME" in the  last 168 hours. Cardiac Enzymes: No results for input(s): "CKTOTAL", "CKMB", "CKMBINDEX", "TROPONINI" in the last 168 hours. BNP (last 3 results) No results for input(s): "PROBNP" in the last 8760 hours. HbA1C: No results for input(s): "HGBA1C" in the last 72 hours. CBG: No results for input(s): "GLUCAP" in the last 168 hours. Lipid Profile: No results for input(s): "CHOL", "HDL", "LDLCALC", "TRIG", "CHOLHDL", "LDLDIRECT" in the last 72 hours. Thyroid Function Tests: No results for input(s): "TSH", "T4TOTAL", "FREET4", "T3FREE", "THYROIDAB" in the last 72 hours. Anemia Panel: No results for input(s): "VITAMINB12", "FOLATE", "FERRITIN", "TIBC", "IRON", "RETICCTPCT" in the last 72 hours. Urine analysis:    Component Value Date/Time   COLORURINE YELLOW (A) 01/24/2021 1613   APPEARANCEUR CLEAR (A) 01/24/2021 1613   APPEARANCEUR Clear 12/08/2013 1435   LABSPEC 1.009 01/24/2021 1613   LABSPEC 1.009 12/08/2013 1435   PHURINE 7.0 01/24/2021 1613   GLUCOSEU NEGATIVE 01/24/2021 1613   GLUCOSEU NEGATIVE 02/10/2015 1330   HGBUR NEGATIVE 01/24/2021 1613   BILIRUBINUR NEGATIVE 01/24/2021 1613   BILIRUBINUR neg 07/03/2018 1607   BILIRUBINUR Negative 12/08/2013 1435   KETONESUR NEGATIVE 01/24/2021 1613   PROTEINUR NEGATIVE 01/24/2021 1613   UROBILINOGEN 0.2 07/03/2018 1607   UROBILINOGEN 0.2 02/10/2015 1330   NITRITE NEGATIVE 01/24/2021 1613   LEUKOCYTESUR NEGATIVE 01/24/2021 1613   LEUKOCYTESUR Negative 12/08/2013 1435    Radiological Exams on Admission: CT Head Wo Contrast  Result Date: 07/11/2022 CLINICAL DATA:  Headache EXAM: CT HEAD WITHOUT CONTRAST TECHNIQUE: Contiguous axial images were obtained from the base of the skull through the vertex without intravenous contrast. RADIATION DOSE REDUCTION: This exam was performed according to the departmental dose-optimization program which includes automated exposure control, adjustment of the mA and/or kV according to patient size and/or use  of iterative reconstruction technique. COMPARISON:  CT brain 12/12/2021 FINDINGS: Brain: No acute territorial infarction, hemorrhage, or intracranial mass is visualized. Patchy white matter hypodensity consistent with chronic small vessel ischemic change. Atrophy. Stable ventricle size. Vascular: No hyperdense vessel.  Carotid vascular calcification. Skull: Normal. Negative for fracture or focal lesion. Sinuses/Orbits: Patchy mucosal thickening in the ethmoid sinuses. Opacified left sphenoid sinus with mild hyperdensity in the sinus secretions Other: None IMPRESSION: 1. No CT evidence for acute intracranial abnormality. 2. Atrophy and chronic small vessel ischemic changes of the white matter. Electronically Signed   By: Donavan Foil M.D.   On: 07/11/2022 23:17   DG Chest 2 View  Result Date: 07/11/2022 CLINICAL DATA:  Shortness of breath. Recent pneumonia. EXAM: CHEST - 2 VIEW COMPARISON:  Radiograph and CT 07/07/2022 FINDINGS: Stable cardiomegaly. Unchanged mediastinal contours, aortic atherosclerosis. Improving patchy right  upper lobe for passage a from prior exam. No new or progressive airspace disease. No pleural effusion or pneumothorax. Spinal stimulator in place. Thoracic spondylosis. IMPRESSION: 1. Improving patchy right upper lobe airspace disease for passage from prior exam. 2. Stable cardiomegaly and aortic atherosclerosis. Electronically Signed   By: Keith Rake M.D.   On: 07/11/2022 17:00     Data Reviewed: Relevant notes from primary care and specialist visits, past discharge summaries as available in EHR, including Care Everywhere. Prior diagnostic testing as pertinent to current admission diagnoses Updated medications and problem lists for reconciliation ED course, including vitals, labs, imaging, treatment and response to treatment Triage notes, nursing and pharmacy notes and ED provider's notes Notable results as noted in HPI   Assessment and Plan: * Pneumonia Acute on  chronic respiratory failure with hypoxia COPD Continue Rocephin and azithromycin Duo nebs as needed Supplemental oxygen to keep sats over 92% Antitussives   Aortic stenosis Mild Followed by cardiologist, Dr. Saralyn Pilar  Chronic diastolic CHF (congestive heart failure) ([place]) Euvolemic clinically Continue benazepril, metoprolol, furosemide  Hypertensive urgency Continue amlodipine, benazepril, metoprolol and Lasix Hydralazine IV as needed for SBP over 170  Chronic pain Continue MS Contin with hydrocodone as needed, duloxetine, gabapentin   Chronic venous insufficiency Followed by vascular Continue Lasix, aspirin and atorvastatin  OSA (obstructive sleep apnea) Uses O2 nightly.  Not on CPAP  Hypothyroidism Continue levothyroxine        DVT prophylaxis: Lovenox  Consults: none  Advance Care Planning:   Code Status: Prior   Family Communication: none  Disposition Plan: Back to previous home environment  Severity of Illness: The appropriate patient status for this patient is INPATIENT. Inpatient status is judged to be reasonable and necessary in order to provide the required intensity of service to ensure the patient's safety. The patient's presenting symptoms, physical exam findings, and initial radiographic and laboratory data in the context of their chronic comorbidities is felt to place them at high risk for further clinical deterioration. Furthermore, it is not anticipated that the patient will be medically stable for discharge from the hospital within 2 midnights of admission.   * I certify that at the point of admission it is my clinical judgment that the patient will require inpatient hospital care spanning beyond 2 midnights from the point of admission due to high intensity of service, high risk for further deterioration and high frequency of surveillance required.*  Author: Athena Masse, MD 07/12/2022 1:23 AM  For on call review [email or web address].

## 2022-07-12 NOTE — ED Notes (Signed)
Patient changed into gown and positioned in bed for comfort.   Patient resting in bed free from sign of distress. Breathing unlabored speaking in full sentences with symmetric chest rise and fall. Bed low and locked with side rails raised x2. Call bell in reach and monitor in place.

## 2022-07-12 NOTE — Assessment & Plan Note (Signed)
Followed by vascular Continue Lasix, aspirin and atorvastatin

## 2022-07-12 NOTE — Assessment & Plan Note (Signed)
Euvolemic clinically Continue benazepril, metoprolol, furosemide

## 2022-07-12 NOTE — Assessment & Plan Note (Signed)
Mild Followed by cardiologist, Dr. Saralyn Pilar

## 2022-07-12 NOTE — Assessment & Plan Note (Signed)
Uses O2 nightly.  Not on CPAP

## 2022-07-12 NOTE — ED Notes (Signed)
Pt assisted back to bed from toilet. Pt remains on 2 liter's [place]. Pt is AOX4, ambulatory with assistance/walker. Respirations even and unlabored.

## 2022-07-12 NOTE — Assessment & Plan Note (Addendum)
Suspect pressure injury from long term use of neck pillow in chair and at night.  Low suspicion for infectious cause given chronic intermittent concern and no systemic signs on exam. -Recommend use of donut pillow to relieve pressure -Recommend use of barrier ointment to promote healing  -Avoid scratching -Tylenol 325-500 mg every 6 hours as needed for discomfort -Follow up with Dermatology as scheduled -If no improvement in 1 week, follow up with PCP

## 2022-07-12 NOTE — Assessment & Plan Note (Signed)
Continue MS Contin with hydrocodone as needed, duloxetine, gabapentin

## 2022-07-12 NOTE — ED Provider Notes (Signed)
-----------------------------------------  12:09 AM on 07/12/2022 -----------------------------------------   Patient with increased cough/congestion shortness of breath despite antibiotics and prednisone.  Currently her saturation 88% at PCPs office, 89% here.  Has oxygen at home but uses it only at night; since being diagnosed with pneumonia using it continuously.  Respiratory swab is positive for rhinovirus per PCPs documentation.  Blood pressure remains elevated; place IV to administer hydralazine.  Patient appears weak and tired.  Discussed with patient and family member; will consult hospital services for evaluation and admission.     Paulette Blanch, MD 07/12/22 [phone]

## 2022-07-12 NOTE — ED Notes (Signed)
Patient resting in bed free from sign of distress. Breathing  with symmetric chest rise and fall. Bed low and locked with side rails raised x2. Call bell in reach and monitor in place.

## 2022-07-12 NOTE — Assessment & Plan Note (Signed)
Acute on chronic respiratory failure with hypoxia COPD Continue Rocephin and azithromycin Duo nebs as needed Supplemental oxygen to keep sats over 92% Antitussives

## 2022-07-12 NOTE — ED Notes (Signed)
Pt assisted to the commode via walker with minimal assistance.

## 2022-07-12 NOTE — Progress Notes (Signed)
PHARMACY -  BRIEF ANTIBIOTIC NOTE   Pharmacy has received consult(s) for ABx for CAP from an ED provider.  The patient's profile has been reviewed for ht/wt/allergies/indication/available labs.    One time order(s) placed for  Ceftriaxone 1 gm IV X 1 and Azithromycin 500 mg IV X 1.   Pt has cedinir and clarithomycin listed as allergies.  Prior records indicate pt has tolerated cephalexin and azithromycin previously.   Further antibiotics/pharmacy consults should be ordered by admitting physician if indicated.                       Thank you, Harryette Shuart D 07/12/2022  12:46 AM

## 2022-07-12 NOTE — Plan of Care (Signed)
Patient seen and examined personally, I reviewed the chart, history and physical and admission note, done by admitting physician this morning and agree with the same with following addendum.  Please refer to the morning admission note for more detailed plan of care.  Briefly,  88 y.o.f w/ COPD/pulmonary hypertension/OSA,diastolic CHF with chronic exertional dyspnea on  prn 02 at 2 L and as needed Lasix 20 mg hypertension amlodipine 10 mg, benazepril 40 mg, HLD on Lipitor and aspirin 81, hypothyroidism on Synthroid 100 mcg on chronic MS Contin 60 mg twice daily,and Linzess and Norco 10-325 mg tid, anxiety disorder on Cymbalta/BuSpar, mild aortic stenosis, chronic anemia, HTN, lower extremity venous stasis presented with shortness of breath, recently seen in urgent care on 11/3 for cough and shortness of breath and placed on antibiotic-clean steroids antitussive without much improvement, seen in the ED 11/5, had a CT chest with multifocal pneumonia on the right side, otherwise stable labs, Augmentin was added and discharged presenting with persistent wet cough and need to use oxygen continuously and also with shortness of breath no chest pain fever chills.  In the ED no temp, hr ~110, RR ~ 24 but mostly below 20.  BP was elevated to 219/88 on arrival.  O2 sat was 89% on 2 L.  Labs showed a normal WBC.  COVID and flu negative.  Hemoglobin 10.2 and glucose 209.She labs chest x-ray showed improvement in patchy right upper lobe airspace disease.  CT head was done as daughter was concerned about confusion however it showed no acute findings.  Patient was placed on the Rocephin and azithromycin and admitted Subsequently RVP came back positive for rhinovirus   On exam patient feels much better.  Complains of diarrhea reports usually gets it from antibiotic Discussed with the nursing staff  issues Pneumonia failed outpatient oral antibiotics Acute on chronic hypoxic respiratory failure Chronic COPD Rhinovirus  infection: Continue Rocephin/azithromycin, bronchodilators supplemental oxygen.  Follow-up culture data  Mild aortic stenosis Chronic diastolic CHF with chronic exertional dyspnea on  prn 02 at 2 L and prn lasix 20 mg Hypertension urgency: Poorly controlled, asked nurse to resumed home amlodipine 10 mg, benazepril 40 mg, and as needed's this am. HLD on Lipitor and aspirin 81 Hypothyroidism on Synthroid 100 mcg  Chronic opiate use for chronic neck back and shoulder pain: on chronic MS Contin 60 mg  bid,and Linzess and Norco 10-325 mg tid Anxiety disorder on Cymbalta/BuSpar [place] on nightly o2, not on cpap

## 2022-07-12 NOTE — ED Notes (Signed)
Pt assisted to toilet to urinate and back to bed.

## 2022-07-12 NOTE — Hospital Course (Addendum)
88 y.o.f w/ COPD/pulmonary hypertension/OSA,diastolic CHF with chronic exertional dyspnea on  prn 02 at 2 L and as needed Lasix 20 mg hypertension amlodipine 10 mg, benazepril 40 mg, HLD on Lipitor and aspirin 81, hypothyroidism on Synthroid 100 mcg on chronic MS Contin 60 mg twice daily,and Linzess and Norco 10-325 mg tid, anxiety disorder on Cymbalta/BuSpar, mild aortic stenosis, chronic anemia, HTN, lower extremity venous stasis presented with shortness of breath, recently seen in urgent care on 11/3 for cough and shortness of breath and placed on antibiotic-clean steroids antitussive without much improvement, seen in the ED 11/5, had a CT chest with multifocal pneumonia on the right side, otherwise stable labs, Augmentin was added and discharged presenting with persistent wet cough and need to use oxygen continuously and also with shortness of breath no chest pain fever chills.  In the ED no temp, hr ~110, RR ~ 24 but mostly below 20.  BP was elevated to 219/88 on arrival.  O2 sat was 89% on 2 L.  Labs showed a normal WBC.  COVID and flu negative.  Hemoglobin 10.2 and glucose 209.She labs chest x-ray showed improvement in patchy right upper lobe airspace disease.  CT head was done as daughter was concerned about confusion however it showed no acute findings.  Patient was placed on the Rocephin and azithromycin and admitted Subsequently RVP came back positive for rhinovirus

## 2022-07-12 NOTE — ED Notes (Addendum)
Called daughter [name] at [phone] per secretary request. Left message for call-back.

## 2022-07-12 NOTE — Assessment & Plan Note (Signed)
Continue amlodipine, benazepril, metoprolol and Lasix Hydralazine IV as needed for SBP over 170

## 2022-07-13 DIAGNOSIS — J189 Pneumonia, unspecified organism: Secondary | ICD-10-CM | POA: Diagnosis not present

## 2022-07-13 LAB — BASIC METABOLIC PANEL
Anion gap: 4 — ABNORMAL LOW (ref 5–15)
BUN: 14 mg/dL (ref 8–23)
CO2: 35 mmol/L — ABNORMAL HIGH (ref 22–32)
Calcium: 8.9 mg/dL (ref 8.9–10.3)
Chloride: 106 mmol/L (ref 98–111)
Creatinine, Ser: 0.79 mg/dL (ref 0.44–1.00)
GFR, Estimated: >60 mL/min (ref >60)
Glucose, Bld: 85 mg/dL (ref 70–99)
Potassium: 3.7 mmol/L (ref 3.5–5.1)
Sodium: 145 mmol/L (ref 135–145)

## 2022-07-13 LAB — CBC
HCT: 28.8 % — ABNORMAL LOW (ref 36.0–46.0)
Hemoglobin: 9.2 g/dL — ABNORMAL LOW (ref 12.0–15.0)
MCH: 32.2 pg (ref 26.0–34.0)
MCHC: 31.9 g/dL (ref 30.0–36.0)
MCV: 100.7 fL — ABNORMAL HIGH (ref 80.0–100.0)
Platelets: 186 10*3/uL (ref 150–400)
RBC: 2.86 MIL/uL — ABNORMAL LOW (ref 3.87–5.11)
RDW: 13.5 % (ref 11.5–15.5)
WBC: 6.1 10*3/uL (ref 4.0–10.5)
nRBC: 0 % (ref 0.0–0.2)

## 2022-07-13 MED ORDER — SENNOSIDES-DOCUSATE SODIUM 8.6-50 MG PO TABS: 1.0000 | ORAL_TABLET | Freq: Every evening | ORAL | Status: DC | PRN

## 2022-07-13 MED ORDER — SODIUM CHLORIDE 0.9 % IV SOLN: INTRAVENOUS | Status: DC | PRN

## 2022-07-13 MED ORDER — GUAIFENESIN 100 MG/5ML PO LIQD: 5.0000 mL | ORAL | Status: DC | PRN

## 2022-07-13 MED ORDER — FUROSEMIDE 10 MG/ML IJ SOLN
20.0000 mg | Freq: Two times a day (BID) | INTRAMUSCULAR | Status: AC
  Administered 2022-07-13 (×2): 20 mg via INTRAVENOUS
  Filled 2022-07-13 (×2): qty 2

## 2022-07-13 MED ORDER — HYDRALAZINE HCL 20 MG/ML IJ SOLN: 10.0000 mg | INTRAMUSCULAR | Status: DC | PRN

## 2022-07-13 MED ORDER — METOPROLOL TARTRATE 5 MG/5ML IV SOLN: 5.0000 mg | INTRAVENOUS | Status: DC | PRN

## 2022-07-13 MED ORDER — POTASSIUM CHLORIDE 20 MEQ PO PACK
40.0000 meq | PACK | Freq: Once | ORAL | Status: AC
  Administered 2022-07-13: 40 meq via ORAL
  Filled 2022-07-13: qty 2

## 2022-07-13 MED ORDER — IPRATROPIUM-ALBUTEROL 0.5-2.5 (3) MG/3ML IN SOLN: 3.0000 mL | RESPIRATORY_TRACT | Status: DC | PRN

## 2022-07-13 NOTE — Evaluation (Signed)
Physical Therapy Evaluation Patient Details Name: Kimberly Roy MRN: [id number] DOB: [birth year] Today's Date: 07/13/2022  History of Present Illness  Kimberly Roy is a 88 y.o. female with medical history significant for COPD, pulmonary hypertension, OSA, diastolic CHF, with chronic exertional dyspnea on as needed O2 at 2 L, mild aortic stenosis, chronic anemia, HTN, lower extremity venous stasis, who presents to the ED for shortness of breath. CT chest showed multifocal pneumonia on the right side.  Clinical Impression  Patient seated in recliner upon arrival to room; alert and oriented, follows commands and agreeable to participation with session with min encouragement from therapist.  Endorses chronic pain/soreness to back, neck and R hip/buttocks (FACES 4/10); improved with repositioning and propping as able.  Generally weak and deconditioned, but no focal weakness appreciated.  Currently requiring min assist for sit/stand, basic transfers and short-distance gait with RW (20').  Demonstrates forward flexed posture with mod WBing bilat UEs; maintains flexed posture to bilat knees, L > R, throughout gait cycle.  Fatigues quickly, mod SOB; distance limited as result (sats >95% on 2L). Would benefit from skilled PT to address above deficits and promote optimal return to PLOF.; Recommend transition to HHPT upon discharge from acute hospitalization.        Recommendations for follow up therapy are one component of a multi-disciplinary discharge planning process, led by the attending physician.  Recommendations may be updated based on patient status, additional functional criteria and insurance authorization.  Follow Up Recommendations Home health PT      Assistance Recommended at Discharge PRN  Patient can return home with the following  A little help with walking and/or transfers;A little help with bathing/dressing/bathroom    Equipment Recommendations    Recommendations for Other Services        Functional Status Assessment Patient has had a recent decline in their functional status and demonstrates the ability to make significant improvements in function in a reasonable and predictable amount of time.     Precautions / Restrictions Precautions Precautions: Fall Restrictions Weight Bearing Restrictions: No      Mobility  Bed Mobility               General bed mobility comments: NT, pt received/left sitting in recliner    Transfers Overall transfer level: Needs assistance Equipment used: Rolling walker (2 wheels) Transfers: Sit to/from Stand Sit to Stand: Min guard, Min assist           General transfer comment: STS from recliner and BSC, VC for hand placement, assist to rise from lower transfer surface; increased time/effor to complete, but able to complete with minimal physical assist from therapist    Ambulation/Gait Ambulation/Gait assistance: Min assist Gait Distance (Feet): 20 Feet Assistive device: Rolling walker (2 wheels)         General Gait Details: forward flexed posture with mod WBing bilat UEs; maintains flexed posture to bilat knees, L > R, throughout gait cycle.  Fatigues quickly, mod SOB; distance limited as result (sats >95% on 2L)  Stairs            Wheelchair Mobility    Modified Rankin (Stroke Patients Only)       Balance Overall balance assessment: Needs assistance Sitting-balance support: No upper extremity supported, Feet supported Sitting balance-Leahy Scale: Good     Standing balance support: Bilateral upper extremity supported, During functional activity Standing balance-Leahy Scale: Fair  Pertinent Vitals/Pain Pain Assessment Pain Assessment: Faces Faces Pain Scale: Hurts even more Pain Location: back, R hip/buttocks Pain Descriptors / Indicators: Aching Pain Intervention(s): Monitored during session, Repositioned, Limited activity within patient's tolerance     Home Living Family/patient expects to be discharged to:: Private residence Living Arrangements: Children Available Help at Discharge: Family;Personal care attendant;Available 24 hours/day Type of Home: House         Home Layout: One level Home Equipment: Tub bench;Rollator (4 wheels);BSC/3in1;Adaptive equipment;Grab bars - toilet;Grab bars - tub/shower Additional Comments: Pt reports son has "learning disability" but is physically able to help with some household management tasks (yard work, Producer, television/film/video). Has PCA M-F for 8 hours. Daughter lives in [place]. Pt wears 2L O2 at night.    Prior Function Prior Level of Function : Needs assist             Mobility Comments: Uses rollator in the home and to appointments, denies history of falls in past 6 months; home O2 at night (2L) ADLs Comments: PCA assists with all ADLs and IADLs. PCA provides transportation and assists with groceries. Pt has been taking sponge baths recently. IND for medication management, daughter delivers meds to her. Has BSC by bed at home for night time toileting.     Hand Dominance        Extremity/Trunk Assessment   Upper Extremity Assessment Upper Extremity Assessment: Generalized weakness    Lower Extremity Assessment Lower Extremity Assessment: Generalized weakness (grossly at least 4-/5 throughout)    Cervical / Trunk Assessment Cervical / Trunk Assessment: Kyphotic  Communication   Communication: No difficulties  Cognition Arousal/Alertness: Awake/alert Behavior During Therapy: WFL for tasks assessed/performed Overall Cognitive Status: Within Functional Limits for tasks assessed                                          General Comments General comments (skin integrity, edema, etc.): SpO2 96% at rest on 2L O2 via [place]    Exercises     Assessment/Plan    PT Assessment Patient needs continued PT services  PT Problem List Decreased strength;Decreased activity  tolerance;Decreased balance;Decreased mobility;Decreased cognition;Decreased knowledge of use of DME;Decreased safety awareness;Decreased knowledge of precautions;Cardiopulmonary status limiting activity;Pain       PT Treatment Interventions DME instruction;Gait training;Functional mobility training;Therapeutic activities;Therapeutic exercise;Balance training;Patient/family education    PT Goals (Current goals can be found in the Care Plan section)  Acute Rehab PT Goals Patient Stated Goal: to get stronger and to get more comfortable PT Goal Formulation: With patient Time For Goal Achievement: 07/27/22 Potential to Achieve Goals: Fair    Frequency Min 2X/week     Co-evaluation               AM-PAC PT "6 Clicks" Mobility  Outcome Measure Help needed turning from your back to your side while in a flat bed without using bedrails?: A Little Help needed moving from lying on your back to sitting on the side of a flat bed without using bedrails?: A Little Help needed moving to and from a bed to a chair (including a wheelchair)?: A Little Help needed standing up from a chair using your arms (e.g., wheelchair or bedside chair)?: A Little Help needed to walk in hospital room?: A Little Help needed climbing 3-5 steps with a railing? : A Lot 6 Click Score: 17    End of Session Equipment  Utilized During Treatment: Gait belt;Oxygen Activity Tolerance: Patient tolerated treatment well Patient left: in chair;with call bell/phone within reach Nurse Communication: Mobility status PT Visit Diagnosis: Muscle weakness (generalized) (M62.81);Difficulty in walking, not elsewhere classified (R26.2)    Time: 2712-9290 PT Time Calculation (min) (ACUTE ONLY): 28 min   Charges:   PT Evaluation $PT Eval Moderate Complexity: 1 Mod         Jasneet Schobert H. Owens Shark, PT, DPT, NCS 07/13/22, 4:37 PM [phone]

## 2022-07-13 NOTE — Evaluation (Addendum)
Occupational Therapy Evaluation Patient Details Name: Kimberly Roy MRN: [id number] DOB: 09-23-1930 Today's Date: 07/13/2022   History of Present Illness Kimberly Roy is a 88 y.o. female with medical history significant for COPD, pulmonary hypertension, OSA, diastolic CHF, with chronic exertional dyspnea on as needed O2 at 2 L, mild aortic stenosis, chronic anemia, HTN, lower extremity venous stasis, who presents to the ED for shortness of breath. CT chest showed multifocal pneumonia on the right side.   Clinical Impression   Patient seen for OT evaluation. Pt presenting with decreased independence in self care, balance, functional mobility/transfers, and endurance. Pt lives with her son and has a PCA M-F for 8 hours/day to assist with all ADLs/IADLs. Pt currently functioning at [facility] for functional transfers, Min guard for functional mobility to [facility] using RW, and set up-supervision for seated grooming tasks. Increased assistance required for LB dressing and peri care after toileting. Pt will benefit from acute OT to increase overall independence in the areas of ADLs, functional mobility in order to safely discharge home. Pt could benefit from [facility] following D/C to decrease falls risk, improve balance, and maximize independence in self-care within own home environment.    Recommendations for follow up therapy are one component of a multi-disciplinary discharge planning process, led by the attending physician.  Recommendations may be updated based on patient status, additional functional criteria and insurance authorization.   Follow Up Recommendations  Home health OT    Assistance Recommended at Discharge Intermittent Supervision/Assistance  Patient can return home with the following A little help with walking and/or transfers;A lot of help with bathing/dressing/bathroom;Assistance with cooking/housework;Assist for transportation;Help with stairs or ramp for entrance    Functional Status  Assessment  Patient has had a recent decline in their functional status and demonstrates the ability to make significant improvements in function in a reasonable and predictable amount of time.  Equipment Recommendations  None recommended by OT    Recommendations for Other Services       Precautions / Restrictions Precautions Precautions: Fall Restrictions Weight Bearing Restrictions: No      Mobility Bed Mobility               General bed mobility comments: NT, pt received/left sitting in recliner    Transfers Overall transfer level: Needs assistance Equipment used: Rolling walker (2 wheels) Transfers: Sit to/from Stand Sit to Stand: Min assist           General transfer comment: STS from recliner and BSC, VC for hand placement, assist to rise from lower transfer surface      Balance Overall balance assessment: Needs assistance Sitting-balance support: Feet supported, Bilateral upper extremity supported Sitting balance-Leahy Scale: Good     Standing balance support: Bilateral upper extremity supported, During functional activity Standing balance-Leahy Scale: Fair                             ADL either performed or assessed with clinical judgement   ADL Overall ADL's : Needs assistance/impaired Eating/Feeding: Set up;Sitting   Grooming: Brushing hair;Set up;Sitting               Lower Body Dressing: Maximal assistance;Sitting/lateral leans Lower Body Dressing Details (indicate cue type and reason): socks Toilet Transfer: BSC/3in1;Rolling walker (2 wheels);Minimal assistance;Ambulation Toilet Transfer Details (indicate cue type and reason): Min A to stand from [place] and Hygiene: Maximal assistance;Sit to/from stand Toileting - Water quality scientist Details (  indicate cue type and reason): encouraged pt to attempt peri care in standing, however, deferred 2/2 needing BUE support from RW     Functional  mobility during ADLs: Min guard;Rolling walker (2 wheels) (to take steps from recliner<>BSC)       Vision Baseline Vision/History: 1 Wears glasses Patient Visual Report: No change from baseline       Perception     Praxis      Pertinent Vitals/Pain Pain Assessment Pain Assessment: 0-10 Pain Score: 10-Worst pain ever Pain Location: neck, low back, "all over"  Pain Descriptors / Indicators: Constant Pain Intervention(s): Limited activity within patient's tolerance, Monitored during session, Premedicated before session     Hand Dominance     Extremity/Trunk Assessment Upper Extremity Assessment Upper Extremity Assessment: Generalized weakness   Lower Extremity Assessment Lower Extremity Assessment: Generalized weakness   Cervical / Trunk Assessment Cervical / Trunk Assessment: Kyphotic   Communication Communication Communication: No difficulties   Cognition Arousal/Alertness: Awake/alert Behavior During Therapy: WFL for tasks assessed/performed Overall Cognitive Status: Within Functional Limits for tasks assessed                                       General Comments  SpO2 96% at rest on 2L O2 via [place]    Exercises Other Exercises Other Exercises: OT provided education re: role of OT, OT POC, post acute recs, sitting up for all meals, EOB/OOB mobility with assistance, home/fall safety.     Shoulder Instructions      Home Living Family/patient expects to be discharged to:: Private residence Living Arrangements: Children Available Help at Discharge: Family;Personal care attendant;Available 24 hours/day Type of Home: House       Home Layout: One level     Bathroom Shower/Tub: Sponge bathes at baseline;Tub/shower unit;Door   Bathroom Toilet: Handicapped height     Home Equipment: Tub bench;Rollator (4 wheels);BSC/3in1;Adaptive equipment;Grab bars - toilet;Grab bars - tub/shower Adaptive Equipment: Sock aid;Reacher Additional Comments: Pt  reports son has "learning disability" but is physically able to help with some household management tasks (yard work, Producer, television/film/video). Has PCA M-F for 8 hours. Daughter lives in [place]. Pt wears 2L O2 at night.      Prior Functioning/Environment Prior Level of Function : Needs assist             Mobility Comments: Uses rollator in the home and to appointments, denies history of falls in past 6 months ADLs Comments: PCA assists with all ADLs and IADLs. PCA provides transportation and assists with groceries. Pt has been taking sponge baths recently. IND for medication management, daughter delivers meds to her. Has BSC by bed at home for night time toileting.        OT Problem List: Decreased strength;Decreased range of motion;Decreased activity tolerance;Impaired balance (sitting and/or standing);Cardiopulmonary status limiting activity      OT Treatment/Interventions: Self-care/ADL training;Therapeutic exercise;Therapeutic activities;Energy conservation;DME and/or AE instruction;Patient/family education;Balance training    OT Goals(Current goals can be found in the care plan section) Acute Rehab OT Goals Patient Stated Goal: to go home OT Goal Formulation: With patient Time For Goal Achievement: 07/27/22 Potential to Achieve Goals: Good   OT Frequency: Min 2X/week    Co-evaluation              AM-PAC OT "6 Clicks" Daily Activity     Outcome Measure Help from another person eating meals?: None Help from another person  taking care of personal grooming?: A Little Help from another person toileting, which includes using toliet, bedpan, or urinal?: A Lot Help from another person bathing (including washing, rinsing, drying)?: A Lot Help from another person to put on and taking off regular upper body clothing?: A Little Help from another person to put on and taking off regular lower body clothing?: A Lot 6 Click Score: 16   End of Session Equipment Utilized During Treatment: Gait  belt;Oxygen;Rolling walker (2 wheels) (2L O2 via [place]) Nurse Communication: Mobility status  Activity Tolerance: Patient tolerated treatment well Patient left: in chair;with call bell/phone within reach;Other (comment) (MD present in room)  OT Visit Diagnosis: Unsteadiness on feet (R26.81);Muscle weakness (generalized) (M62.81);Pain                Time: 4709-6283 OT Time Calculation (min): 40 min Charges:  OT General Charges $OT Visit: 1 Visit OT Evaluation $OT Eval Low Complexity: 1 Low OT Treatments $Self Care/Home Management : 8-22 mins  [facility] MS, OTR/L ascom [phone]  07/13/22, 3:23 PM

## 2022-07-13 NOTE — Progress Notes (Signed)
PROGRESS NOTE    Kimberly Roy  YCX:[id number] DOB: 17-Dec-1930 DOA: 07/11/2022 PCP: Einar Pheasant, MD   Brief Narrative:  88 y.o. female with medical history significant for COPD, pulmonary hypertension, OSA, diastolic CHF, with chronic exertional dyspnea on as needed O2 at 2 L, mild aortic stenosis, chronic anemia, HTN, lower extremity venous stasis, who presents to the ED for shortness of breath.  Patient was seen in the urgent care on 11/3 for cough and shortness of breath and treated with doxycycline, prednisone and Tessalon Perles.  Due to insufficient improvement she went to the ED on 11/5.  She had a CT chest that showed a multifocal pneumonia on the right side but other work-up was unremarkable with normal WBC and negative procalcitonin.  Augmentin was added to her regimen and she was discharged, however she returns due to a persistent wet cough and need to use O2 continuously for relief in her shortness of breath instead of as needed. CT head was done as daughter was concerned about confusion however it showed no acute findings. Respiratory panel was positive for Rhinovirus. Did have elevated BNP.    Assessment & Plan:  Principal Problem:   Pneumonia Active Problems:   Hypothyroidism   OSA (obstructive sleep apnea)   Chronic venous insufficiency   Chronic pain   Hypertensive urgency   Chronic diastolic CHF (congestive heart failure) (HCC)   Aortic stenosis     Assessment and Plan: * Viral Pneumonia Acute on chronic respiratory failure with hypoxia COPD ProCal is neg, will stop Abx. Resp Panel positive for Rhinovirus, Nebs, IS/Flutter. Supp O2 as needed.   Acute on Chronic diastolic CHF (congestive heart failure) (HCC) Has elevated BNP, suspect symptoms are from fluids overload and viral pna.  Last Echo in 2019= ef 55% Continue benazepril, metoprolol. Will give a dose of IV lasix.   Aortic stenosis; mild Followed by cardiologist, Dr. Saralyn Pilar  Hypertensive  urgency Continue amlodipine, benazepril, metoprolol and Lasix Hydralazine IV as needed for SBP over 170  Chronic pain Continue MS Contin with hydrocodone as needed, duloxetine, gabapentin  Chronic venous insufficiency Followed by vascular Continue Lasix, aspirin and atorvastatin  OSA (obstructive sleep apnea) Uses O2 nightly.  Not on CPAP  Hypothyroidism Continue levothyroxine    DVT prophylaxis: enoxaparin (LOVENOX) injection 40 mg Start: 07/12/22 0800 Code Status: Full Family Communication:  Called Debbie; updated  Status is: Inpatient Breathing improved but no where close to baseline according to her, cont hosp stay.     Subjective: Breathing improved but no where close to baseline.  She denies any chest pain.  Worked with therapy this morning.    Examination:  General exam: Appears calm and comfortable; 2L [place], weak frail. Khyphosis.  Respiratory system: Clear to auscultation. Respiratory effort normal. Cardiovascular system: S1 & S2 heard, RRR. No JVD, murmurs, rubs, gallops or clicks. No pedal edema. Gastrointestinal system: Abdomen is nondistended, soft and nontender. No organomegaly or masses felt. Normal bowel sounds heard. Central nervous system: Alert and oriented. No focal neurological deficits. Extremities: Symmetric 4 x 5 power. Skin: No rashes, lesions or ulcers Psychiatry: Judgement and insight appear normal. Mood & affect appropriate.     Objective: Vitals:   07/12/22 1959 07/13/22 0114 07/13/22 0355 07/13/22 0719  BP: (!) 184/88 (!) 138/52 (!) 140/62 (!) 166/55  Pulse: 76 (!) 54 (!) 53 (!) 59  Resp: 20 (!) 22  20  Temp: 99 F (37.2 C) 98.6 F (37 C) 98.6 F (37 C) 97.8 F (36.6 C)  TempSrc: Oral  Oral Oral  SpO2: 95% 93% 94% 97%  Weight:        Intake/Output Summary (Last 24 hours) at 07/13/2022 0856 Last data filed at 07/13/2022 0835 Gross per 24 hour  Intake --  Output 900 ml  Net -900 ml   Filed Weights   07/11/22 1552   Weight: 78.9 kg     Data Reviewed:   CBC: Recent Labs  Lab 07/07/22 1020 07/11/22 1558 07/13/22 0436  WBC 8.8 6.5 6.1  HGB 9.8* 10.2* 9.2*  HCT 30.8* 31.4* 28.8*  MCV 102.0* 99.7 100.7*  PLT 135* 188 417   Basic Metabolic Panel: Recent Labs  Lab 07/07/22 1020 07/11/22 1558 07/13/22 0436  NA 138 139 145  K 3.3* 3.7 3.7  CL 103 99 106  CO2 28 33* 35*  GLUCOSE 131* 209* 85  BUN _0 CREATININE 0.70 0.70 0.79  CALCIUM 9.1 9.3 8.9   GFR: Estimated Creatinine Clearance: 46.4 mL/min (by C-G formula based on SCr of 0.79 mg/dL). Liver Function Tests: Recent Labs  Lab 07/07/22 1020 07/11/22 1558  AST 30 25  ALT 12 18  ALKPHOS 68 70  BILITOT 0.9 0.6  PROT 6.8 6.8  ALBUMIN 3.7 3.7   No results for input(s): "LIPASE", "AMYLASE" in the last 168 hours. No results for input(s): "AMMONIA" in the last 168 hours. Coagulation Profile: No results for input(s): "INR", "PROTIME" in the last 168 hours. Cardiac Enzymes: No results for input(s): "CKTOTAL", "CKMB", "CKMBINDEX", "TROPONINI" in the last 168 hours. BNP (last 3 results) No results for input(s): "PROBNP" in the last 8760 hours. HbA1C: No results for input(s): "HGBA1C" in the last 72 hours. CBG: No results for input(s): "GLUCAP" in the last 168 hours. Lipid Profile: No results for input(s): "CHOL", "HDL", "LDLCALC", "TRIG", "CHOLHDL", "LDLDIRECT" in the last 72 hours. Thyroid Function Tests: No results for input(s): "TSH", "T4TOTAL", "FREET4", "T3FREE", "THYROIDAB" in the last 72 hours. Anemia Panel: No results for input(s): "VITAMINB12", "FOLATE", "FERRITIN", "TIBC", "IRON", "RETICCTPCT" in the last 72 hours. Sepsis Labs: Recent Labs  Lab 07/07/22 1020  PROCALCITON <0.10    Recent Results (from the past 240 hour(s))  Resp Panel by RT-PCR (Flu A&B, Covid) Anterior Nasal Swab     Status: None   Collection Time: 07/07/22 10:55 AM   Specimen: Anterior Nasal Swab  Result Value Ref Range Status   SARS  Coronavirus 2 by RT PCR NEGATIVE NEGATIVE Final    Comment: (NOTE) SARS-CoV-2 target nucleic acids are NOT DETECTED.  The SARS-CoV-2 RNA is generally detectable in upper respiratory specimens during the acute phase of infection. The lowest concentration of SARS-CoV-2 viral copies this assay can detect is 138 copies/mL. A negative result does not preclude SARS-Cov-2 infection and should not be used as the sole basis for treatment or other patient management decisions. A negative result may occur with  improper specimen collection/handling, submission of specimen other than nasopharyngeal swab, presence of viral mutation(s) within the areas targeted by this assay, and inadequate number of viral copies(<138 copies/mL). A negative result must be combined with clinical observations, patient history, and epidemiological information. The expected result is Negative.  Fact Sheet for Patients:  EntrepreneurPulse.com.au  Fact Sheet for Healthcare Providers:  IncredibleEmployment.be  This test is no t yet approved or cleared by the [place] FDA and  has been authorized for detection and/or diagnosis of SARS-CoV-2 by FDA under an Emergency Use Authorization (EUA). This EUA will remain  in effect (meaning this test  can be used) for the duration of the COVID-19 declaration under Section 564(b)(1) of the Act, 21 U.S.C.section 360bbb-3(b)(1), unless the authorization is terminated  or revoked sooner.       Influenza A by PCR NEGATIVE NEGATIVE Final   Influenza B by PCR NEGATIVE NEGATIVE Final    Comment: (NOTE) The Xpert Xpress SARS-CoV-2/FLU/RSV plus assay is intended as an aid in the diagnosis of influenza from Nasopharyngeal swab specimens and should not be used as a sole basis for treatment. Nasal washings and aspirates are unacceptable for Xpert Xpress SARS-CoV-2/FLU/RSV testing.  Fact Sheet for  Patients: EntrepreneurPulse.com.au  Fact Sheet for Healthcare Providers: IncredibleEmployment.be  This test is not yet approved or cleared by the [place] FDA and has been authorized for detection and/or diagnosis of SARS-CoV-2 by FDA under an Emergency Use Authorization (EUA). This EUA will remain in effect (meaning this test can be used) for the duration of the COVID-19 declaration under Section 564(b)(1) of the Act, 21 U.S.C. section 360bbb-3(b)(1), unless the authorization is terminated or revoked.  Performed at [facility], [address] [place], [place] [address]   Respiratory (~20 pathogens) panel by PCR     Status: Abnormal   Collection Time: 07/07/22 12:33 PM   Specimen: Nasopharyngeal Swab; Respiratory  Result Value Ref Range Status   Adenovirus NOT DETECTED NOT DETECTED Final   Coronavirus 229E NOT DETECTED NOT DETECTED Final    Comment: (NOTE) The Coronavirus on the Respiratory Panel, DOES NOT test for the novel  Coronavirus (2019 nCoV)    Coronavirus HKU1 NOT DETECTED NOT DETECTED Final   Coronavirus NL63 NOT DETECTED NOT DETECTED Final   Coronavirus OC43 NOT DETECTED NOT DETECTED Final   Metapneumovirus NOT DETECTED NOT DETECTED Final   Rhinovirus / Enterovirus DETECTED (A) NOT DETECTED Final   Influenza A NOT DETECTED NOT DETECTED Final   Influenza B NOT DETECTED NOT DETECTED Final   Parainfluenza Virus 1 NOT DETECTED NOT DETECTED Final   Parainfluenza Virus 2 NOT DETECTED NOT DETECTED Final   Parainfluenza Virus 3 NOT DETECTED NOT DETECTED Final   Parainfluenza Virus 4 NOT DETECTED NOT DETECTED Final   Respiratory Syncytial Virus NOT DETECTED NOT DETECTED Final   Bordetella pertussis NOT DETECTED NOT DETECTED Final   Bordetella Parapertussis NOT DETECTED NOT DETECTED Final   Chlamydophila pneumoniae NOT DETECTED NOT DETECTED Final   Mycoplasma pneumoniae NOT DETECTED NOT DETECTED Final    Comment:  Performed at [facility], [place]. [address] [place], [place] [address]  Resp Panel by RT-PCR (Flu A&B, Covid) Anterior Nasal Swab     Status: None   Collection Time: 07/11/22 10:41 PM   Specimen: Anterior Nasal Swab  Result Value Ref Range Status   SARS Coronavirus 2 by RT PCR NEGATIVE NEGATIVE Final    Comment: (NOTE) SARS-CoV-2 target nucleic acids are NOT DETECTED.  The SARS-CoV-2 RNA is generally detectable in upper respiratory specimens during the acute phase of infection. The lowest concentration of SARS-CoV-2 viral copies this assay can detect is 138 copies/mL. A negative result does not preclude SARS-Cov-2 infection and should not be used as the sole basis for treatment or other patient management decisions. A negative result may occur with  improper specimen collection/handling, submission of specimen other than nasopharyngeal swab, presence of viral mutation(s) within the areas targeted by this assay, and inadequate number of viral copies(<138 copies/mL). A negative result must be combined with clinical observations, patient history, and epidemiological information. The expected result is Negative.  Fact Sheet for Patients:  EntrepreneurPulse.com.au  Fact Sheet for Healthcare Providers:  IncredibleEmployment.be  This test is no t yet approved or cleared by the [place] FDA and  has been authorized for detection and/or diagnosis of SARS-CoV-2 by FDA under an Emergency Use Authorization (EUA). This EUA will remain  in effect (meaning this test can be used) for the duration of the COVID-19 declaration under Section 564(b)(1) of the Act, 21 U.S.C.section 360bbb-3(b)(1), unless the authorization is terminated  or revoked sooner.       Influenza A by PCR NEGATIVE NEGATIVE Final   Influenza B by PCR NEGATIVE NEGATIVE Final    Comment: (NOTE) The Xpert Xpress SARS-CoV-2/FLU/RSV plus assay is intended as an aid in the diagnosis  of influenza from Nasopharyngeal swab specimens and should not be used as a sole basis for treatment. Nasal washings and aspirates are unacceptable for Xpert Xpress SARS-CoV-2/FLU/RSV testing.  Fact Sheet for Patients: EntrepreneurPulse.com.au  Fact Sheet for Healthcare Providers: IncredibleEmployment.be  This test is not yet approved or cleared by the [place] FDA and has been authorized for detection and/or diagnosis of SARS-CoV-2 by FDA under an Emergency Use Authorization (EUA). This EUA will remain in effect (meaning this test can be used) for the duration of the COVID-19 declaration under Section 564(b)(1) of the Act, 21 U.S.C. section 360bbb-3(b)(1), unless the authorization is terminated or revoked.  Performed at [facility], [address], [place], [place] [address]          Radiology Studies: CT Head Wo Contrast  Result Date: 07/11/2022 CLINICAL DATA:  Headache EXAM: CT HEAD WITHOUT CONTRAST TECHNIQUE: Contiguous axial images were obtained from the base of the skull through the vertex without intravenous contrast. RADIATION DOSE REDUCTION: This exam was performed according to the departmental dose-optimization program which includes automated exposure control, adjustment of the mA and/or kV according to patient size and/or use of iterative reconstruction technique. COMPARISON:  CT brain 12/12/2021 FINDINGS: Brain: No acute territorial infarction, hemorrhage, or intracranial mass is visualized. Patchy white matter hypodensity consistent with chronic small vessel ischemic change. Atrophy. Stable ventricle size. Vascular: No hyperdense vessel.  Carotid vascular calcification. Skull: Normal. Negative for fracture or focal lesion. Sinuses/Orbits: Patchy mucosal thickening in the ethmoid sinuses. Opacified left sphenoid sinus with mild hyperdensity in the sinus secretions Other: None IMPRESSION: 1. No CT evidence for acute  intracranial abnormality. 2. Atrophy and chronic small vessel ischemic changes of the white matter. Electronically Signed   By: Donavan Foil M.D.   On: 07/11/2022 23:17   DG Chest 2 View  Result Date: 07/11/2022 CLINICAL DATA:  Shortness of breath. Recent pneumonia. EXAM: CHEST - 2 VIEW COMPARISON:  Radiograph and CT 07/07/2022 FINDINGS: Stable cardiomegaly. Unchanged mediastinal contours, aortic atherosclerosis. Improving patchy right upper lobe for passage a from prior exam. No new or progressive airspace disease. No pleural effusion or pneumothorax. Spinal stimulator in place. Thoracic spondylosis. IMPRESSION: 1. Improving patchy right upper lobe airspace disease for passage from prior exam. 2. Stable cardiomegaly and aortic atherosclerosis. Electronically Signed   By: Keith Rake M.D.   On: 07/11/2022 17:00        Scheduled Meds:  alum & mag hydroxide-simeth  15 mL Oral TID AC & HS   amLODipine  10 mg Oral Daily   aspirin EC  81 mg Oral Daily   atorvastatin  20 mg Oral QHS   benazepril  40 mg Oral Daily   busPIRone  10 mg Oral TID   calcium-vitamin  D  1 tablet Oral Daily   cholecalciferol  1,000 Units Oral Daily   DULoxetine  60 mg Oral Daily   enoxaparin (LOVENOX) injection  40 mg Subcutaneous Q24H   gabapentin  600 mg Oral TID   HYDROcodone-acetaminophen  1 tablet Oral TID   ipratropium  2 spray Each Nare Q12H   [START ON 07/15/2022] ketoconazole  1 Application Topical Once per day on Mon Wed Fri   levothyroxine  100 mcg Oral Q0600   magnesium oxide  400 mg Oral Daily   metoprolol succinate  50 mg Oral Daily   morphine  60 mg Oral Q12H   Naldemedine Tosylate  0.2 mg Oral Daily   pantoprazole  40 mg Oral Daily   simethicone  80 mg Oral BID   Continuous Infusions:  sodium chloride 10 mL/hr at 07/13/22 0127   azithromycin 500 mg (07/13/22 0336)   cefTRIAXone (ROCEPHIN)  IV 2 g (07/13/22 0128)     LOS: 1 day   Time spent= 35 mins    Adaira Centola Arsenio Loader, MD [facility]  If 7PM-7AM, please contact night-coverage  07/13/2022, 8:56 AM

## 2022-07-14 ENCOUNTER — Inpatient Hospital Stay
Admit: 2022-07-14 | Discharge: 2022-07-14 | Disposition: A | Discharge status: Home / Self Care | Payer: Medicare HMO | Attending: Internal Medicine | Admitting: Internal Medicine

## 2022-07-14 DIAGNOSIS — J189 Pneumonia, unspecified organism: Secondary | ICD-10-CM | POA: Diagnosis not present

## 2022-07-14 LAB — BASIC METABOLIC PANEL
Anion gap: 3 — ABNORMAL LOW (ref 5–15)
BUN: 23 mg/dL (ref 8–23)
CO2: 34 mmol/L — ABNORMAL HIGH (ref 22–32)
Calcium: 8.5 mg/dL — ABNORMAL LOW (ref 8.9–10.3)
Chloride: 103 mmol/L (ref 98–111)
Creatinine, Ser: 0.8 mg/dL (ref 0.44–1.00)
GFR, Estimated: >60 mL/min (ref >60)
Glucose, Bld: 85 mg/dL (ref 70–99)
Potassium: 3.9 mmol/L (ref 3.5–5.1)
Sodium: 140 mmol/L (ref 135–145)

## 2022-07-14 LAB — CBC
HCT: 28.5 % — ABNORMAL LOW (ref 36.0–46.0)
Hemoglobin: 9.1 g/dL — ABNORMAL LOW (ref 12.0–15.0)
MCH: 32.3 pg (ref 26.0–34.0)
MCHC: 31.9 g/dL (ref 30.0–36.0)
MCV: 101.1 fL — ABNORMAL HIGH (ref 80.0–100.0)
Platelets: 177 10*3/uL (ref 150–400)
RBC: 2.82 MIL/uL — ABNORMAL LOW (ref 3.87–5.11)
RDW: 13.3 % (ref 11.5–15.5)
WBC: 6.9 10*3/uL (ref 4.0–10.5)
nRBC: 0 % (ref 0.0–0.2)

## 2022-07-14 LAB — ECHOCARDIOGRAM COMPLETE
AR max vel: 1.1 cm2
AV Area VTI: 1.24 cm2
AV Area mean vel: 1.17 cm2
AV Mean grad: 11 mmHg
AV Peak grad: 24.8 mmHg
Ao pk vel: 2.49 m/s
Area-P 1/2: 2.21 cm2
P 1/2 time: 606 msec
S' Lateral: 3 cm
Weight: 2784 oz

## 2022-07-14 LAB — MAGNESIUM: Magnesium: 2.2 mg/dL (ref 1.7–2.4)

## 2022-07-14 MED ORDER — FUROSEMIDE 10 MG/ML IJ SOLN
20.0000 mg | Freq: Once | INTRAMUSCULAR | Status: AC
  Administered 2022-07-14: 20 mg via INTRAVENOUS
  Filled 2022-07-14: qty 2

## 2022-07-14 NOTE — Progress Notes (Signed)
PROGRESS NOTE    Kimberly Roy  JAZ:[id number] DOB: 1931-01-01 DOA: 07/11/2022 PCP: Einar Pheasant, MD   Brief Narrative:  88 y.o. female with medical history significant for COPD, pulmonary hypertension, OSA, diastolic CHF, with chronic exertional dyspnea on as needed O2 at 2 L, mild aortic stenosis, chronic anemia, HTN, lower extremity venous stasis, who presents to the ED for shortness of breath.  Patient was seen in the urgent care on 11/3 for cough and shortness of breath and treated with doxycycline, prednisone and Tessalon Perles.  Due to insufficient improvement she went to the ED on 11/5.  She had a CT chest that showed a multifocal pneumonia on the right side but other work-up was unremarkable with normal WBC and negative procalcitonin.  Augmentin was added to her regimen and she was discharged, however she returns due to a persistent wet cough and need to use O2 continuously for relief in her shortness of breath instead of as needed. CT head was done as daughter was concerned about confusion however it showed no acute findings. Respiratory panel was positive for Rhinovirus. Did have elevated BNP. Echo ordered.    Assessment & Plan:  Principal Problem:   Pneumonia Active Problems:   Hypothyroidism   OSA (obstructive sleep apnea)   Chronic venous insufficiency   Chronic pain   Hypertensive urgency   Chronic diastolic CHF (congestive heart failure) (HCC)   Aortic stenosis     Assessment and Plan: * Viral Pneumonia Acute on chronic respiratory failure with hypoxia COPD ProCal is neg, will stop Abx. Resp Panel positive for Rhinovirus, Nebs, IS/Flutter. Will attempt to wean off O2 today.   Acute on Chronic diastolic CHF (congestive heart failure) (HCC) Has elevated BNP, suspect symptoms are from fluids overload and viral pna.  Last Echo in 2019= ef 55% Continue benazepril, metoprolol. Lasix 69m iv once Echo results pending.   Aortic stenosis; mild Followed by cardiologist,  Dr. PSaralyn Pilar Hypertensive urgency Continue amlodipine, benazepril, metoprolol and Lasix Hydralazine IV as needed for SBP over 170  Chronic pain Continue MS Contin with hydrocodone as needed, duloxetine, gabapentin  Chronic venous insufficiency Followed by vascular Continue Lasix, aspirin and atorvastatin  OSA (obstructive sleep apnea) Uses O2 nightly.  Not on CPAP  Hypothyroidism Continue levothyroxine    DVT prophylaxis: enoxaparin (LOVENOX) injection 40 mg Start: 07/12/22 0800 Code Status: Full Family Communication:  Called Debbie; updated yesterday  Status is: Inpatient Feels better, but still has exertional sob. Hopefully home in the next day  Subjective: Feels better but not back to baseline yet.   Examination:  Constitutional: Not in acute distress. khyphotic Respiratory: diminished BS at the bases.  Cardiovascular: Normal sinus rhythm, no rubs Abdomen: Nontender nondistended good bowel sounds Musculoskeletal: No edema noted Skin: No rashes seen Neurologic: CN 2-12 grossly intact.  And nonfocal Psychiatric: Normal judgment and insight. Alert and oriented x 3. Normal mood.    Objective: Vitals:   07/13/22 1626 07/13/22 1940 07/14/22 0005 07/14/22 0418  BP: (!) 143/61 (!) 136/51 (!) 132/53 (!) 114/46  Pulse: (!) 56 (!) 52 (!) 52 (!) 44  Resp: _0 Temp: 97.9 F (36.6 C) 98 F (36.7 C) 98 F (36.7 C) 98 F (36.7 C)  TempSrc: Oral     SpO2: 100% 98% 97% 100%  Weight:        Intake/Output Summary (Last 24 hours) at 07/14/2022 00707Last data filed at 07/13/2022 1839 Gross per 24 hour  Intake 147.81 ml  Output  1975 ml  Net -1827.19 ml   Filed Weights   07/11/22 1552  Weight: 78.9 kg     Data Reviewed:   CBC: Recent Labs  Lab 07/07/22 1020 07/11/22 1558 07/13/22 0436 07/14/22 0311  WBC 8.8 6.5 6.1 6.9  HGB 9.8* 10.2* 9.2* 9.1*  HCT 30.8* 31.4* 28.8* 28.5*  MCV 102.0* 99.7 100.7* 101.1*  PLT 135* 188 186 387   Basic  Metabolic Panel: Recent Labs  Lab 07/07/22 1020 07/11/22 1558 07/13/22 0436 07/14/22 0311  NA 138 139 145 140  K 3.3* 3.7 3.7 3.9  CL 103 99 106 103  CO2 28 33* 35* 34*  GLUCOSE 131* 209* 85 85  BUN _0 CREATININE 0.70 0.70 0.79 0.80  CALCIUM 9.1 9.3 8.9 8.5*  MG  --   --   --  2.2   GFR: Estimated Creatinine Clearance: 46.4 mL/min (by C-G formula based on SCr of 0.8 mg/dL). Liver Function Tests: Recent Labs  Lab 07/07/22 1020 07/11/22 1558  AST 30 25  ALT 12 18  ALKPHOS 68 70  BILITOT 0.9 0.6  PROT 6.8 6.8  ALBUMIN 3.7 3.7   No results for input(s): "LIPASE", "AMYLASE" in the last 168 hours. No results for input(s): "AMMONIA" in the last 168 hours. Coagulation Profile: No results for input(s): "INR", "PROTIME" in the last 168 hours. Cardiac Enzymes: No results for input(s): "CKTOTAL", "CKMB", "CKMBINDEX", "TROPONINI" in the last 168 hours. BNP (last 3 results) No results for input(s): "PROBNP" in the last 8760 hours. HbA1C: No results for input(s): "HGBA1C" in the last 72 hours. CBG: No results for input(s): "GLUCAP" in the last 168 hours. Lipid Profile: No results for input(s): "CHOL", "HDL", "LDLCALC", "TRIG", "CHOLHDL", "LDLDIRECT" in the last 72 hours. Thyroid Function Tests: No results for input(s): "TSH", "T4TOTAL", "FREET4", "T3FREE", "THYROIDAB" in the last 72 hours. Anemia Panel: No results for input(s): "VITAMINB12", "FOLATE", "FERRITIN", "TIBC", "IRON", "RETICCTPCT" in the last 72 hours. Sepsis Labs: Recent Labs  Lab 07/07/22 1020  PROCALCITON <0.10    Recent Results (from the past 240 hour(s))  Resp Panel by RT-PCR (Flu A&B, Covid) Anterior Nasal Swab     Status: None   Collection Time: 07/07/22 10:55 AM   Specimen: Anterior Nasal Swab  Result Value Ref Range Status   SARS Coronavirus 2 by RT PCR NEGATIVE NEGATIVE Final    Comment: (NOTE) SARS-CoV-2 target nucleic acids are NOT DETECTED.  The SARS-CoV-2 RNA is generally detectable  in upper respiratory specimens during the acute phase of infection. The lowest concentration of SARS-CoV-2 viral copies this assay can detect is 138 copies/mL. A negative result does not preclude SARS-Cov-2 infection and should not be used as the sole basis for treatment or other patient management decisions. A negative result may occur with  improper specimen collection/handling, submission of specimen other than nasopharyngeal swab, presence of viral mutation(s) within the areas targeted by this assay, and inadequate number of viral copies(<138 copies/mL). A negative result must be combined with clinical observations, patient history, and epidemiological information. The expected result is Negative.  Fact Sheet for Patients:  EntrepreneurPulse.com.au  Fact Sheet for Healthcare Providers:  IncredibleEmployment.be  This test is no t yet approved or cleared by the [place] FDA and  has been authorized for detection and/or diagnosis of SARS-CoV-2 by FDA under an Emergency Use Authorization (EUA). This EUA will remain  in effect (meaning this test can be used) for the duration of the COVID-19 declaration under Section 564(b)(1) of  the Act, 21 U.S.C.section 360bbb-3(b)(1), unless the authorization is terminated  or revoked sooner.       Influenza A by PCR NEGATIVE NEGATIVE Final   Influenza B by PCR NEGATIVE NEGATIVE Final    Comment: (NOTE) The Xpert Xpress SARS-CoV-2/FLU/RSV plus assay is intended as an aid in the diagnosis of influenza from Nasopharyngeal swab specimens and should not be used as a sole basis for treatment. Nasal washings and aspirates are unacceptable for Xpert Xpress SARS-CoV-2/FLU/RSV testing.  Fact Sheet for Patients: EntrepreneurPulse.com.au  Fact Sheet for Healthcare Providers: IncredibleEmployment.be  This test is not yet approved or cleared by the [place] FDA and has  been authorized for detection and/or diagnosis of SARS-CoV-2 by FDA under an Emergency Use Authorization (EUA). This EUA will remain in effect (meaning this test can be used) for the duration of the COVID-19 declaration under Section 564(b)(1) of the Act, 21 U.S.C. section 360bbb-3(b)(1), unless the authorization is terminated or revoked.  Performed at [facility], [address] [place], [place] [address]   Respiratory (~20 pathogens) panel by PCR     Status: Abnormal   Collection Time: 07/07/22 12:33 PM   Specimen: Nasopharyngeal Swab; Respiratory  Result Value Ref Range Status   Adenovirus NOT DETECTED NOT DETECTED Final   Coronavirus 229E NOT DETECTED NOT DETECTED Final    Comment: (NOTE) The Coronavirus on the Respiratory Panel, DOES NOT test for the novel  Coronavirus (2019 nCoV)    Coronavirus HKU1 NOT DETECTED NOT DETECTED Final   Coronavirus NL63 NOT DETECTED NOT DETECTED Final   Coronavirus OC43 NOT DETECTED NOT DETECTED Final   Metapneumovirus NOT DETECTED NOT DETECTED Final   Rhinovirus / Enterovirus DETECTED (A) NOT DETECTED Final   Influenza A NOT DETECTED NOT DETECTED Final   Influenza B NOT DETECTED NOT DETECTED Final   Parainfluenza Virus 1 NOT DETECTED NOT DETECTED Final   Parainfluenza Virus 2 NOT DETECTED NOT DETECTED Final   Parainfluenza Virus 3 NOT DETECTED NOT DETECTED Final   Parainfluenza Virus 4 NOT DETECTED NOT DETECTED Final   Respiratory Syncytial Virus NOT DETECTED NOT DETECTED Final   Bordetella pertussis NOT DETECTED NOT DETECTED Final   Bordetella Parapertussis NOT DETECTED NOT DETECTED Final   Chlamydophila pneumoniae NOT DETECTED NOT DETECTED Final   Mycoplasma pneumoniae NOT DETECTED NOT DETECTED Final    Comment: Performed at [facility], [place]. [address] [place], [place] [address]  Resp Panel by RT-PCR (Flu A&B, Covid) Anterior Nasal Swab     Status: None   Collection Time: 07/11/22 10:41 PM   Specimen: Anterior Nasal  Swab  Result Value Ref Range Status   SARS Coronavirus 2 by RT PCR NEGATIVE NEGATIVE Final    Comment: (NOTE) SARS-CoV-2 target nucleic acids are NOT DETECTED.  The SARS-CoV-2 RNA is generally detectable in upper respiratory specimens during the acute phase of infection. The lowest concentration of SARS-CoV-2 viral copies this assay can detect is 138 copies/mL. A negative result does not preclude SARS-Cov-2 infection and should not be used as the sole basis for treatment or other patient management decisions. A negative result may occur with  improper specimen collection/handling, submission of specimen other than nasopharyngeal swab, presence of viral mutation(s) within the areas targeted by this assay, and inadequate number of viral copies(<138 copies/mL). A negative result must be combined with clinical observations, patient history, and epidemiological information. The expected result is Negative.  Fact Sheet for Patients:  EntrepreneurPulse.com.au  Fact Sheet for Healthcare Providers:  IncredibleEmployment.be  This test is no t yet approved or cleared by the [place] and  has been authorized for detection and/or diagnosis of SARS-CoV-2 by FDA under an Emergency Use Authorization (EUA). This EUA will remain  in effect (meaning this test can be used) for the duration of the COVID-19 declaration under Section 564(b)(1) of the Act, 21 U.S.C.section 360bbb-3(b)(1), unless the authorization is terminated  or revoked sooner.       Influenza A by PCR NEGATIVE NEGATIVE Final   Influenza B by PCR NEGATIVE NEGATIVE Final    Comment: (NOTE) The Xpert Xpress SARS-CoV-2/FLU/RSV plus assay is intended as an aid in the diagnosis of influenza from Nasopharyngeal swab specimens and should not be used as a sole basis for treatment. Nasal washings and aspirates are unacceptable for Xpert Xpress SARS-CoV-2/FLU/RSV testing.  Fact Sheet for  Patients: EntrepreneurPulse.com.au  Fact Sheet for Healthcare Providers: IncredibleEmployment.be  This test is not yet approved or cleared by the [place] FDA and has been authorized for detection and/or diagnosis of SARS-CoV-2 by FDA under an Emergency Use Authorization (EUA). This EUA will remain in effect (meaning this test can be used) for the duration of the COVID-19 declaration under Section 564(b)(1) of the Act, 21 U.S.C. section 360bbb-3(b)(1), unless the authorization is terminated or revoked.  Performed at [facility], [address], [place], [place] [address]          Radiology Studies: No results found.      Scheduled Meds:  alum & mag hydroxide-simeth  15 mL Oral TID AC & HS   amLODipine  10 mg Oral Daily   aspirin EC  81 mg Oral Daily   atorvastatin  20 mg Oral QHS   benazepril  40 mg Oral Daily   busPIRone  10 mg Oral TID   calcium-vitamin D  1 tablet Oral Daily   cholecalciferol  1,000 Units Oral Daily   DULoxetine  60 mg Oral Daily   enoxaparin (LOVENOX) injection  40 mg Subcutaneous Q24H   gabapentin  600 mg Oral TID   HYDROcodone-acetaminophen  1 tablet Oral TID   ipratropium  2 spray Each Nare Q12H   [START ON 07/15/2022] ketoconazole  1 Application Topical Once per day on Mon Wed Fri   levothyroxine  100 mcg Oral Q0600   magnesium oxide  400 mg Oral Daily   metoprolol succinate  50 mg Oral Daily   morphine  60 mg Oral Q12H   pantoprazole  40 mg Oral Daily   simethicone  80 mg Oral BID   Continuous Infusions:  sodium chloride Stopped (07/13/22 0547)     LOS: 2 days   Time spent= 35 mins    Mersades Barbaro Arsenio Loader, MD [facility]  If 7PM-7AM, please contact night-coverage  07/14/2022, 8:32 AM

## 2022-07-14 NOTE — TOC Initial Note (Signed)
Transition of Care [facility]) - Initial/Assessment Note    Patient Details  Name: Kimberly Roy MRN: [id number] Date of Birth: 29-May-1931  Transition of Care [facility]) CM/SW Contact:    Valente David, RN Phone Number: 07/14/2022, 5:08 PM  Clinical Narrative:                  Patient admitted from home where she lives with son.  She has private caregiver through long term care insurance, Monday-Friday 8 hours each day.  PCP is Dr. Nicki Reaper and she uses [facility] for medications.  She has DME in the home (walker,, shower chair, BSC).  Notified of recommendations for home health PT/OT, she agrees and prefers to work with [facility].  Call placed to [facility], voice message left.  TOC team to follow up tomorrow.  Expected Discharge Plan: [place] Barriers to Discharge: Continued Medical Work up   Patient Goals and CMS Choice Patient states their goals for this hospitalization and ongoing recovery are:: Home with [facility] services, caregiver in the home CMS Medicare.gov Compare Post Acute Care list provided to:: Patient Choice offered to / list presented to : Patient  Expected Discharge Plan and Services Expected Discharge Plan: [place] Acute Care Choice: [place] arrangements for the past 2 months: Single Family Home                                      Prior Living Arrangements/Services Living arrangements for the past 2 months: Single Family Home Lives with:: Adult Children   Do you feel safe going back to the place where you live?: Yes          Current home services: DME, Other (comment) (private caregiver)    Activities of Daily Living Home Assistive Devices/Equipment: Environmental consultant (specify type) ADL Screening (condition at time of admission) Patient's cognitive ability adequate to safely complete daily activities?: Yes Is the patient deaf or have difficulty hearing?: No Does the patient have difficulty seeing, even  when wearing glasses/contacts?: No Does the patient have difficulty concentrating, remembering, or making decisions?: No Patient able to express need for assistance with ADLs?: Yes Does the patient have difficulty dressing or bathing?: No Independently performs ADLs?: No Communication: Independent Dressing (OT): Independent Grooming: Independent Feeding: Independent Bathing: Needs assistance Is this a change from baseline?: Change from baseline, expected to last <3 days Toileting: Needs assistance Is this a change from baseline?: Change from baseline, expected to last <3 days In/Out Bed: Needs assistance Is this a change from baseline?: Change from baseline, expected to last <3 days Walks in Home: Needs assistance Is this a change from baseline?: Change from baseline, expected to last <3 days Does the patient have difficulty walking or climbing stairs?: No Weakness of Legs: None Weakness of Arms/Hands: None  Permission Sought/Granted                  Emotional Assessment              Admission diagnosis:  Pneumonia [J18.9] Hypoxia [R09.02] Pneumonia due to infectious organism, unspecified laterality, unspecified part of lung [J18.9] Hypertension, unspecified type [I10] Patient Active Problem List   Diagnosis Date Noted   Pneumonia 07/12/2022   Hypertensive urgency 07/12/2022   Aortic stenosis 07/12/2022   Soft tissue injury 07/12/2022   Anemia 04/13/2022   B12 deficiency  04/04/2022   Interstitial cystitis 03/28/2022   Iron deficiency 02/28/2022   Right foot pain 01/29/2022   SOB (shortness of breath) 10/14/2021   Rash 03/03/2021   Skin lesion 03/03/2021   Aortic atherosclerosis ([place]) 11/20/2020   Bradycardia 06/08/2020   Itching 04/08/2020   Dyspnea 01/17/2020   Chronic respiratory failure with hypoxia (HCC) 01/17/2020   Right hip pain 01/10/2020   Leukocytosis 11/15/2019   Wound of buttock 10/25/2019   COVID-19 virus infection 10/04/2019   Urinary  frequency 09/26/2019   Cold feeling 09/12/2019   Left knee pain 07/04/2019   Hemoptysis 06/30/2019   Chronic diastolic CHF (congestive heart failure) ([place]) 08/10/2018   Femur fracture ([place]) 08/06/2018   Primary osteoarthritis of right shoulder 08/15/2017   Right shoulder pain 05/16/2017   Chronic venous insufficiency 02/10/2017   Lymphedema 02/10/2017   Neuropathy 12/25/2016   Anemia due to blood loss, chronic 08/07/2016   GERD (gastroesophageal reflux disease) 12/10/2015   Finger pain 11/13/2015   Carotid artery calcification 09/26/2015   External nasal lesion 09/25/2015   Muscle cramps 09/01/2015   Excessive sweating 07/30/2015   Headache 07/16/2015   Groin pain 05/14/2015   Muscle twitching 03/05/2015   Leg cramps 02/11/2015   Back pain 02/11/2015   Abdominal pain 02/11/2015   Chronic pain 01/19/2015   Acute cystitis without hematuria 12/12/2014   Left elbow pain 11/30/2014   Health care maintenance 11/30/2014   Osteoporosis 10/19/2014   Rectal bleeding 10/19/2014   Neck pain 09/03/2014   Unsteady gait 09/03/2014   Nocturia 09/03/2014   Dysphagia 06/01/2014   Nasal dryness 06/01/2014   Stress 06/01/2014   Fatigue 02/27/2014   Degenerative disc disease 12/21/2013   Pre-op evaluation 10/12/2013   Hoarseness 08/14/2013   Leg swelling 01/24/2013   CHF (congestive heart failure) ([place]) 12/29/2012   Cough 12/29/2012   OSA (obstructive sleep apnea) 07/09/2012   Osteoarthritis 07/04/2012   Symptomatic anemia 07/04/2012   Chronic constipation 07/04/2012   Pulmonary hypertension ([place]) 07/04/2012   Pulmonary nodules 07/04/2012   Hypertension 07/04/2012   Hyperlipidemia 07/04/2012   Hypothyroidism 07/04/2012   PCP:  Einar Pheasant, MD Pharmacy:   [id number] [place], [id number]: [phone]: [address] [place]Phone: [phone]: [phone]    Social Determinants of Health (SDOH) Interventions    Readmission Risk Interventions     No data to display

## 2022-07-14 NOTE — Progress Notes (Signed)
Physical Therapy Treatment Patient Details Name: Kimberly Roy MRN: [id number] DOB: May 31, 1931 Today's Date: 07/14/2022   History of Present Illness Kimberly Roy is a 88 y.o. female with medical history significant for COPD, pulmonary hypertension, OSA, diastolic CHF, with chronic exertional dyspnea on as needed O2 at 2 L, mild aortic stenosis, chronic anemia, HTN, lower extremity venous stasis, who presents to the ED for shortness of breath. CT chest showed multifocal pneumonia on the right side.    PT Comments    Pt ready for session.  To EOB with rail.  Stood to [employer] with min a x 1 and is able to walk to bathroom then a small lap in room.  Remained in chair after session.    Pt has aid 8 hr a day and has a BSC next to recliner that she sleeps in at night.  She feels comfortable with set up and has all equipment needed at home.   Recommendations for follow up therapy are one component of a multi-disciplinary discharge planning process, led by the attending physician.  Recommendations may be updated based on patient status, additional functional criteria and insurance authorization.  Follow Up Recommendations  Home health PT     Assistance Recommended at Discharge PRN  Patient can return home with the following A little help with walking and/or transfers;A little help with bathing/dressing/bathroom;Assist for transportation;Help with stairs or ramp for entrance   Equipment Recommendations       Recommendations for Other Services       Precautions / Restrictions Precautions Precautions: Fall Restrictions Weight Bearing Restrictions: No     Mobility  Bed Mobility Overal bed mobility: Modified Independent                  Transfers Overall transfer level: Needs assistance Equipment used: Rolling walker (2 wheels) Transfers: Sit to/from Stand Sit to Stand: Min assist, Min guard                Ambulation/Gait Ambulation/Gait assistance: Min assist, Min guard Gait  Distance (Feet): 20 Feet Assistive device: Rolling walker (2 wheels) Gait Pattern/deviations: Step-to pattern, Step-through pattern, Decreased step length - right, Decreased step length - left, Trunk flexed Gait velocity: decreased     General Gait Details: forward flexed posture with mod WBing bilat UEs; maintains flexed posture to bilat knees, L > R, throughout gait cycle.  Fatigues quickly, mod SOB; distance limited as result sats WFL on room air today   Stairs             Wheelchair Mobility    Modified Rankin (Stroke Patients Only)       Balance Overall balance assessment: Needs assistance Sitting-balance support: No upper extremity supported, Feet supported Sitting balance-Leahy Scale: Good     Standing balance support: Bilateral upper extremity supported, During functional activity Standing balance-Leahy Scale: Fair                              Cognition Arousal/Alertness: Awake/alert Behavior During Therapy: WFL for tasks assessed/performed Overall Cognitive Status: Within Functional Limits for tasks assessed                                          Exercises Other Exercises Other Exercises: bathroom to void    General Comments        Pertinent  Vitals/Pain Pain Assessment Pain Assessment: Faces Faces Pain Scale: Hurts little more Pain Location: back, R hip/buttocks Pain Descriptors / Indicators: Aching, Sore Pain Intervention(s): Limited activity within patient's tolerance, Monitored during session, Repositioned    Home Living                          Prior Function            PT Goals (current goals can now be found in the care plan section) Progress towards PT goals: Progressing toward goals    Frequency    Min 2X/week      PT Plan Current plan remains appropriate    Co-evaluation              AM-PAC PT "6 Clicks" Mobility   Outcome Measure  Help needed turning from your back to  your side while in a flat bed without using bedrails?: A Little Help needed moving from lying on your back to sitting on the side of a flat bed without using bedrails?: A Little Help needed moving to and from a bed to a chair (including a wheelchair)?: A Little Help needed standing up from a chair using your arms (e.g., wheelchair or bedside chair)?: A Little Help needed to walk in hospital room?: A Little Help needed climbing 3-5 steps with a railing? : A Lot 6 Click Score: 17    End of Session Equipment Utilized During Treatment: Gait belt Activity Tolerance: Patient tolerated treatment well Patient left: in chair;with call bell/phone within reach Nurse Communication: Mobility status PT Visit Diagnosis: Muscle weakness (generalized) (M62.81);Difficulty in walking, not elsewhere classified (R26.2)     Time: 2824-1753 PT Time Calculation (min) (ACUTE ONLY): 41 min  Charges:  $Gait Training: 8-22 mins $Therapeutic Activity: 23-37 mins                   Chesley Noon, PTA 07/14/22, 4:37 PM

## 2022-07-14 NOTE — Progress Notes (Signed)
  Echocardiogram 2D Echocardiogram has been performed.  Kimberly Roy 07/14/2022, 12:26 PM

## 2022-07-15 ENCOUNTER — Telehealth: Payer: Self-pay | Admitting: Internal Medicine

## 2022-07-15 LAB — CBC
HCT: 28.8 % — ABNORMAL LOW (ref 36.0–46.0)
Hemoglobin: 9.5 g/dL — ABNORMAL LOW (ref 12.0–15.0)
MCH: 33.2 pg (ref 26.0–34.0)
MCHC: 33 g/dL (ref 30.0–36.0)
MCV: 100.7 fL — ABNORMAL HIGH (ref 80.0–100.0)
Platelets: 200 10*3/uL (ref 150–400)
RBC: 2.86 MIL/uL — ABNORMAL LOW (ref 3.87–5.11)
RDW: 13.3 % (ref 11.5–15.5)
WBC: 7.3 10*3/uL (ref 4.0–10.5)
nRBC: 0 % (ref 0.0–0.2)

## 2022-07-15 LAB — BASIC METABOLIC PANEL
Anion gap: 5 (ref 5–15)
BUN: 30 mg/dL — ABNORMAL HIGH (ref 8–23)
CO2: 34 mmol/L — ABNORMAL HIGH (ref 22–32)
Calcium: 9.2 mg/dL (ref 8.9–10.3)
Chloride: 99 mmol/L (ref 98–111)
Creatinine, Ser: 0.94 mg/dL (ref 0.44–1.00)
GFR, Estimated: 58 mL/min — ABNORMAL LOW (ref >60)
Glucose, Bld: 106 mg/dL — ABNORMAL HIGH (ref 70–99)
Potassium: 4 mmol/L (ref 3.5–5.1)
Sodium: 138 mmol/L (ref 135–145)

## 2022-07-15 LAB — MAGNESIUM: Magnesium: 2.4 mg/dL (ref 1.7–2.4)

## 2022-07-15 NOTE — Discharge Summary (Signed)
Physician Discharge Summary  KELANI ROBART VOJ:[id number] DOB: 1930-11-10 DOA: 07/11/2022  PCP: Einar Pheasant, MD  Admit date: 07/11/2022 Discharge date: 07/15/2022  Admitted From: Home Disposition:  Home  Recommendations for Outpatient Follow-up:  Follow up with PCP in 1-2 weeks Please obtain BMP/CBC in one week your next doctors visit.    Home Health: Equipment/Devices: Discharge Condition: Stable CODE STATUS:  Diet recommendation:   Brief/Interim Summary: 88 y.o. female with medical history significant for COPD, pulmonary hypertension, OSA, diastolic CHF, with chronic exertional dyspnea on as needed O2 at 2 L, mild aortic stenosis, chronic anemia, HTN, lower extremity venous stasis, who presents to the ED for shortness of breath.  Patient was seen in the urgent care on 11/3 for cough and shortness of breath and treated with doxycycline, prednisone and Tessalon Perles.  Due to insufficient improvement she went to the ED on 11/5.  She had a CT chest that showed a multifocal pneumonia on the right side but other work-up was unremarkable with normal WBC and negative procalcitonin.  Augmentin was added to her regimen and she was discharged, however she returns due to a persistent wet cough and need to use O2 continuously for relief in her shortness of breath instead of as needed. CT head was done as daughter was concerned about confusion however it showed no acute findings. Respiratory panel was positive for Rhinovirus. Did have elevated BNP. Echo 60%, G1DD.  After some diuresis and supportive care patient felt significantly better.  PT/OT recommended home health therefore arrangements were made.  Today she is medically stable for discharge.        Assessment and Plan: * Viral Pneumonia Acute on chronic respiratory failure with hypoxia COPD ProCal is neg, will stop Abx. Resp Panel positive for Rhinovirus, Nebs, IS/Flutter. Will attempt to wean off O2 today.  Continue with nocturnal  oxygen.   Acute on Chronic diastolic CHF (congestive heart failure) (HCC) Has elevated BNP, suspect symptoms are from fluids overload and viral pna.  Now appears to be euvolemic Last Echo in 2019= ef 55%.  Updated echocardiogram during this admission shows EF of 60% with grade 1 DD. Continue benazepril, metoprolol.  No longer needing diuretics.   Aortic stenosis; mild Followed by cardiologist, Dr. Saralyn Pilar   Hypertensive urgency Continue amlodipine, benazepril, metoprolol and Lasix   Chronic pain Continue MS Contin with hydrocodone as needed, duloxetine, gabapentin   Chronic venous insufficiency Followed by vascular Continue Lasix, aspirin and atorvastatin   OSA (obstructive sleep apnea) Uses O2 nightly.  Not on CPAP   Hypothyroidism Continue levothyroxine          Discharge Diagnoses:  Principal Problem:   Pneumonia Active Problems:   Hypothyroidism   OSA (obstructive sleep apnea)   Chronic venous insufficiency   Chronic pain   Hypertensive urgency   Chronic diastolic CHF (congestive heart failure) ([place])   Aortic stenosis      Consultations: None  Subjective: Patient states she feels great and would like to go home as she is feeling back to her baseline. Also spoke with the patient's daughter over the phone today and all the questions have been answered.  Discharge Exam: Vitals:   07/15/22 0400 07/15/22 0729  BP: (!) 115/43 (!) 111/52  Pulse: (!) 58 (!) 59  Resp: 20 18  Temp: 98.2 F (36.8 C) 98.1 F (36.7 C)  SpO2: 94% 94%   Vitals:   07/14/22 2048 07/15/22 0046 07/15/22 0400 07/15/22 0729  BP: (!) 133/47 (!) 127/44 (!) 115/43 Marland Kitchen)  111/52  Pulse: 66 (!) 58 (!) 58 (!) 59  Resp: _0 Temp: 98.2 F (36.8 C) 98.7 F (37.1 C) 98.2 F (36.8 C) 98.1 F (36.7 C)  TempSrc:    Oral  SpO2: 96% 97% 94% 94%  Weight:        General: Pt is alert, awake, not in acute distress, kyphosis Cardiovascular: RRR, S1/S2 +, no rubs, no  gallops Respiratory: CTA bilaterally, no wheezing, no rhonchi Abdominal: Soft, NT, ND, bowel sounds + Extremities: no edema, no cyanosis  Discharge Instructions   Allergies as of 07/15/2022       Reactions   Lyrica [pregabalin] Swelling   Omnicef [cefdinir] Diarrhea, Nausea And Vomiting   Atarax [hydroxyzine]    jittery   Dicyclomine Other (See Comments)   Abdominal bloating   Hydroxyzine Hcl    jittery   Levaquin [levofloxacin] Swelling   Nitrofurantoin Diarrhea   Nucynta Er [tapentadol Hcl Er] Other (See Comments)   Severe constipation   Oxybutynin Other (See Comments)   Blurred vision   Zoloft [sertraline Hcl]    Severe headache   Biaxin [clarithromycin] Other (See Comments), Rash   Pt does not remember Pt does not remember   Meloxicam Rash   Sertraline Nausea And Vomiting   Severe headache Severe headache Other reaction(s): Headache Severe headache   Sulfa Antibiotics Rash   Pt does not remember   Sulfasalazine Rash   Pt does not remember   Tape Rash   Durabond - redness   Tapentadol Other (See Comments), Rash   _1    Venofer [iron Sucrose] Rash        Medication List     STOP taking these medications    amoxicillin-clavulanate 875-125 MG tablet Commonly known as: AUGMENTIN       TAKE these medications    albuterol 108 (90 Base) MCG/ACT inhaler Commonly known as: VENTOLIN HFA INHALE 2 PUFFS INTO THE LUNGS EVERY 6 HOURS AS NEEDED FOR WHEEZING OR SHORTNESS OF BREATH   aluminum-magnesium hydroxide-simethicone 200-200-20 MG/5ML Susp Commonly known as: MAALOX Take 15 mLs by mouth 4 (four) times daily -  before meals and at bedtime.   amLODipine 10 MG tablet Commonly known as: NORVASC TAKE 1 TABLET BY MOUTH DAILY   aspirin EC 81 MG tablet Take 81 mg by mouth daily. Every other day   atorvastatin 20 MG tablet Commonly known as: LIPITOR TAKE 1  TABLET BY MOUTH AT BEDTIME   benazepril 40 MG tablet Commonly known as: LOTENSIN TAKE 1 TABLET BY MOUTH DAILY   betamethasone dipropionate 0.05 % cream Apply 1 application topically 2 (two) times daily.   busPIRone 10 MG tablet Commonly known as: BUSPAR Take 10 mg by mouth 3 (three) times daily.   calcium citrate-vitamin D 315-200 MG-UNIT tablet Commonly known as: CITRACAL+D Take 2 tablets by mouth daily.   cetirizine 10 MG tablet Commonly known as: ZYRTEC Take 1 tablet (10 mg total) by mouth daily.   clobetasol cream 0.05 % Commonly known as: TEMOVATE Apply 1 application topically See admin instructions. Apply to affected areas of body 1 - 2 times daily as needed for itchy bumps. Avoid applying to face, groin, and axilla. Use as directed.   cyanocobalamin 1000 MCG/ML injection Commonly known as: VITAMIN B12 Inject 1 mL (1,000 mcg total) into the muscle every 3 (three) months.   Dermacloud Oint Apply topically as directed (as discussed).   Diclofenac Sodium 3 %  Gel Apply topically 2 (two) times daily.   DSS 100 MG Caps Take 100 mg by mouth 2 (two) times daily.   DULoxetine 60 MG capsule Commonly known as: CYMBALTA Take 60 mg by mouth daily.   Fluocinolone Acetonide 0.01 % Oil Apply 1-2 drops into ears once to twice daily as needed.   fluocinonide 0.05 % external solution Commonly known as: LIDEX Apply topically 2 (two) times daily. Apply drops to scalp for itch   furosemide 20 MG tablet Commonly known as: LASIX TAKE 1 TABLET BY MOUTH DAILY AS NEEDED   gabapentin 300 MG capsule Commonly known as: NEURONTIN Take two tablets tid (in am, midday and q hs).   GAS-X PO Take 2 tablets by mouth 2 (two) times daily.   HYDROcodone-acetaminophen 10-325 MG tablet Commonly known as: Norco Take 1 tablet by mouth 3 (three) times daily.   ipratropium 0.03 % nasal spray Commonly known as: ATROVENT Place 2 sprays into both nostrils every 12 (twelve) hours.    ketoconazole 2 % shampoo Commonly known as: NIZORAL Apply 1 application topically See admin instructions. apply three times per week, massage into scalp and leave in for 10 minutes before rinsing out   Linzess 145 MCG Caps capsule Generic drug: linaclotide Take 145 mcg by mouth daily.   magnesium oxide 400 MG tablet Commonly known as: MAG-OX Take 1 tablet (400 mg total) by mouth daily.   metoprolol succinate 50 MG 24 hr tablet Commonly known as: TOPROL-XL TAKE 1 TABLET BY MOUTH DAILY. TAKE WITH OR IMMEDIATELY FOLLOWING A MEAL.   morphine 30 MG 12 hr tablet Commonly known as: MS CONTIN Take 30 mg by mouth every 12 (twelve) hours.   morphine 60 MG 12 hr tablet Commonly known as: MS CONTIN Take 1 tablet (60 mg total) by mouth 2 (two) times daily.   Naldemedine Tosylate 0.2 MG Tabs Take 0.2 mg by mouth daily.   nystatin powder Commonly known as: MYCOSTATIN/NYSTOP Apply 1 application. topically 2 (two) times daily as needed.   nystatin cream Commonly known as: MYCOSTATIN Apply 1 Application topically 2 (two) times daily.   polyethylene glycol powder 17 GM/SCOOP powder Commonly known as: GLYCOLAX/MIRALAX MIX 17 GRAMS AS MARKED ON BOTTLE TOP IN 8 OUNCES OF WATER AND DRINK ONCE A DAY AS DIRECTED.   RABEprazole 20 MG tablet Commonly known as: ACIPHEX TAKE 1 TABLET BY MOUTH TWICE A DAY BEFORE A MEAL   senna 8.6 MG Tabs tablet Commonly known as: SENOKOT Take 1 tablet by mouth 2 (two) times daily.   sodium chloride 0.65 % nasal spray Commonly known as: OCEAN Place 1 spray into the nose as needed.   Synthroid 100 MCG tablet Generic drug: levothyroxine Take 1 tablet (100 mcg total) by mouth daily.   triamcinolone ointment 0.1 % Commonly known as: KENALOG APPLY TWICE DAILY TO BITES AND RASH UNTIL FLAT AND SMOOTH **DO NOT APPLY TO FACE**   Vitamin D3 25 MCG (1000 UT) Caps Take 1 capsule by mouth daily.        Follow-up Information     Einar Pheasant, MD. Go in 1  week(s).   Specialty: Internal Medicine Why: Appointment on Friday, 07/19/2022 at 11:00am. Contact information: [address] [place] [address] [phone]         [facility] Follow up.   Specialty: Home Health Services Why: They will follow up with you for your home health therapy needs. Start of care will likely be tomorrow (11/14). Contact information:  [address] [place]  [address] [phone]                Allergies  Allergen Reactions   Lyrica [Pregabalin] Swelling   Omnicef [Cefdinir] Diarrhea and Nausea And Vomiting   Atarax [Hydroxyzine]     jittery   Dicyclomine Other (See Comments)    Abdominal bloating    Hydroxyzine Hcl     jittery   Levaquin [Levofloxacin] Swelling   Nitrofurantoin Diarrhea   Nucynta Er [Tapentadol Hcl Er] Other (See Comments)    Severe constipation    Oxybutynin Other (See Comments)    Blurred vision   Zoloft [Sertraline Hcl]     Severe headache   Biaxin [Clarithromycin] Other (See Comments) and Rash    Pt does not remember Pt does not remember   Meloxicam Rash   Sertraline Nausea And Vomiting    Severe headache Severe headache Other reaction(s): Headache Severe headache   Sulfa Antibiotics Rash    Pt does not remember   Sulfasalazine Rash    Pt does not remember   Tape Rash    Durabond - redness   Tapentadol Other (See Comments) and Rash    _0    Venofer [Iron Sucrose] Rash    You were cared for by a hospitalist during your hospital stay. If you have any questions about your discharge medications or the care you received while you were in the hospital after you are discharged, you can call the unit and asked to speak with the hospitalist on call if the hospitalist that took care of you is not available. Once you are discharged, your primary care physician will handle  any further medical issues. Please note that no refills for any discharge medications will be authorized once you are discharged, as it is imperative that you return to your primary care physician (or establish a relationship with a primary care physician if you do not have one) for your aftercare needs so that they can reassess your need for medications and monitor your lab values.   Procedures/Studies: ECHOCARDIOGRAM COMPLETE  Result Date: 07/14/2022    ECHOCARDIOGRAM REPORT   Patient Name:   [facility] Date of Exam: 07/14/2022 Medical Rec #:  [id number]   Height:       62.0 in Accession #:    [id number]  Weight:       174.0 lb Date of Birth:  1930/12/30    BSA:          1.802 m Patient Age:    88 years    BP:           126/45 mmHg Patient Gender: F           HR:           70 bpm. Exam Location:  [facility] Procedure: 2D Echo Indications:     Cardiomyopathy I42.9  History:         Patient has no prior history of Echocardiogram examinations.  Sonographer:     Kathlen Brunswick RDCS Referring Phys:  [id number] Jeanella Flattery Calianna [name] Diagnosing Phys: Isaias Cowman MD  Sonographer Comments: Technically difficult study due to poor echo windows and suboptimal apical window. Image acquisition challenging due to respiratory motion. IMPRESSIONS  1. Left ventricular ejection fraction, by estimation, is 60 to 65%. The left ventricle has normal function. The left ventricle has no regional wall motion abnormalities. There is mild left ventricular hypertrophy. Left ventricular  diastolic parameters are consistent with Grade I diastolic dysfunction (impaired relaxation).  2. Right ventricular systolic function is normal. The right ventricular size is normal.  3. The mitral valve is normal in structure. Mild mitral valve regurgitation. No evidence of mitral stenosis.  4. Mild to moderate tricuspid stenosis.  5. The aortic valve is normal in structure. Aortic valve regurgitation is mild. Mild aortic valve stenosis.  6. The inferior  vena cava is normal in size with greater than 50% respiratory variability, suggesting right atrial pressure of 3 mmHg. FINDINGS  Left Ventricle: Left ventricular ejection fraction, by estimation, is 60 to 65%. The left ventricle has normal function. The left ventricle has no regional wall motion abnormalities. The left ventricular internal cavity size was normal in size. There is  mild left ventricular hypertrophy. Left ventricular diastolic parameters are consistent with Grade I diastolic dysfunction (impaired relaxation). Right Ventricle: The right ventricular size is normal. No increase in right ventricular wall thickness. Right ventricular systolic function is normal. Left Atrium: Left atrial size was normal in size. Right Atrium: Right atrial size was normal in size. Pericardium: There is no evidence of pericardial effusion. Mitral Valve: The mitral valve is normal in structure. Mild mitral valve regurgitation. No evidence of mitral valve stenosis. Tricuspid Valve: The tricuspid valve is normal in structure. Tricuspid valve regurgitation is mild . Mild to moderate tricuspid stenosis. Aortic Valve: The aortic valve is normal in structure. Aortic valve regurgitation is mild. Aortic regurgitation PHT measures 606 msec. Mild aortic stenosis is present. Aortic valve mean gradient measures 11.0 mmHg. Aortic valve peak gradient measures 24.8 mmHg. Aortic valve area, by VTI measures 1.24 cm. Pulmonic Valve: The pulmonic valve was normal in structure. Pulmonic valve regurgitation is not visualized. No evidence of pulmonic stenosis. Aorta: The aortic root is normal in size and structure. Venous: The inferior vena cava is normal in size with greater than 50% respiratory variability, suggesting right atrial pressure of 3 mmHg. IAS/Shunts: No atrial level shunt detected by color flow Doppler.  LEFT VENTRICLE PLAX 2D LVIDd:         4.60 cm   Diastology LVIDs:         3.00 cm   LV e' medial:    6.31 cm/s LV PW:         1.40  cm   LV E/e' medial:  11.5 LV IVS:        1.20 cm   LV e' lateral:   7.94 cm/s LVOT diam:     1.80 cm   LV E/e' lateral: 9.2 LV SV:         70 LV SV Index:   39 LVOT Area:     2.54 cm  LEFT ATRIUM             Index LA diam:        4.70 cm 2.61 cm/m LA Vol (A2C):   30.2 ml 16.76 ml/m LA Vol (A4C):   19.4 ml 10.77 ml/m LA Biplane Vol: 25.5 ml 14.15 ml/m  AORTIC VALVE                     PULMONIC VALVE AV Area (Vmax):    1.10 cm      PV Vmax:          1.27 m/s AV Area (Vmean):   1.17 cm      PV Peak grad:     6.5 mmHg AV Area (VTI):     1.24 cm  PR End Diast Vel: 5.48 msec AV Vmax:           249.00 cm/s AV Vmean:          145.000 cm/s AV VTI:            0.563 m AV Peak Grad:      24.8 mmHg AV Mean Grad:      11.0 mmHg LVOT Vmax:         108.00 cm/s LVOT Vmean:        66.500 cm/s LVOT VTI:          0.274 m LVOT/AV VTI ratio: 0.49 AI PHT:            606 msec  AORTA Ao Root diam: 3.10 cm Ao Asc diam:  3.10 cm MITRAL VALVE               TRICUSPID VALVE MV Area (PHT): 2.21 cm    TR Peak grad:   41.2 mmHg MV Decel Time: 343 msec    TR Vmax:        321.00 cm/s MV E velocity: 72.80 cm/s MV A velocity: 95.40 cm/s  SHUNTS MV E/A ratio:  0.76        Systemic VTI:  0.27 m                            Systemic Diam: 1.80 cm Isaias Cowman MD Electronically signed by Isaias Cowman MD Signature Date/Time: 07/14/2022/12:34:06 PM    Final    CT Head Wo Contrast  Result Date: 07/11/2022 CLINICAL DATA:  Headache EXAM: CT HEAD WITHOUT CONTRAST TECHNIQUE: Contiguous axial images were obtained from the base of the skull through the vertex without intravenous contrast. RADIATION DOSE REDUCTION: This exam was performed according to the departmental dose-optimization program which includes automated exposure control, adjustment of the mA and/or kV according to patient size and/or use of iterative reconstruction technique. COMPARISON:  CT brain 12/12/2021 FINDINGS: Brain: No acute territorial infarction, hemorrhage, or  intracranial mass is visualized. Patchy white matter hypodensity consistent with chronic small vessel ischemic change. Atrophy. Stable ventricle size. Vascular: No hyperdense vessel.  Carotid vascular calcification. Skull: Normal. Negative for fracture or focal lesion. Sinuses/Orbits: Patchy mucosal thickening in the ethmoid sinuses. Opacified left sphenoid sinus with mild hyperdensity in the sinus secretions Other: None IMPRESSION: 1. No CT evidence for acute intracranial abnormality. 2. Atrophy and chronic small vessel ischemic changes of the white matter. Electronically Signed   By: Donavan Foil M.D.   On: 07/11/2022 23:17   DG Chest 2 View  Result Date: 07/11/2022 CLINICAL DATA:  Shortness of breath. Recent pneumonia. EXAM: CHEST - 2 VIEW COMPARISON:  Radiograph and CT 07/07/2022 FINDINGS: Stable cardiomegaly. Unchanged mediastinal contours, aortic atherosclerosis. Improving patchy right upper lobe for passage a from prior exam. No new or progressive airspace disease. No pleural effusion or pneumothorax. Spinal stimulator in place. Thoracic spondylosis. IMPRESSION: 1. Improving patchy right upper lobe airspace disease for passage from prior exam. 2. Stable cardiomegaly and aortic atherosclerosis. Electronically Signed   By: Keith Rake M.D.   On: 07/11/2022 17:00   CT Chest Wo Contrast  Result Date: 07/07/2022 CLINICAL DATA:  Respiratory illness, nondiagnostic xray, productive cough EXAM: CT CHEST WITHOUT CONTRAST TECHNIQUE: Multidetector CT imaging of the chest was performed following the standard protocol without IV contrast. RADIATION DOSE REDUCTION: This exam was performed according to the departmental dose-optimization program which includes automated exposure control, adjustment  of the mA and/or kV according to patient size and/or use of iterative reconstruction technique. COMPARISON:  CT 12/29/2019 FINDINGS: Cardiovascular: Cardiomegaly. No pericardial effusion. Thoracic aorta is  nonaneurysmal. Scattered atherosclerotic vascular calcifications of the aorta and coronary arteries. Mediastinum/Nodes: Multiple mildly prominent mediastinal lymph nodes including 10 mm precarinal node (series 2, image 60) lymph nodes have minimally increased in size compared to the previous CT from 2021. No axillary lymphadenopathy. Evaluation of the hilar structures is limited in the absence of intravenous contrast. Within this limitation, no obvious hilar adenopathy or mass is identified. Thyroid, trachea, and esophagus demonstrate no significant findings. Lungs/Pleura: Multifocal airspace consolidations within the right upper lobe, predominantly ground-glass attenuation. Background of mosaic attenuation of the upper lung fields. Redemonstration of scattered small pulmonary nodules within the right lung including 7 mm posterior right subpleural nodule (series 3, image 103) and 6 mm peripheral right upper lobe nodule (series 3, image 58). Nodules have minimally increased by 1-2 mm compared to the previous study. No pleural effusion or pneumothorax. Upper Abdomen: Stable right adrenal myelolipoma containing macroscopic fat, benign. No follow-up imaging recommended. No new or acute findings within the included upper abdomen. Musculoskeletal: Bones are demineralized. No new or acute bony findings. Multilevel thoracic spondylosis. Severe bilateral glenohumeral arthropathy. Spinal stimulator leads remain in place within the midthoracic spine. Rounded 1.7 cm hyperdense nodule within the cutaneous and superficial subcutaneous soft tissues of the upper left back, new from prior (series 2, image 19) IMPRESSION: 1. Multifocal airspace consolidations within the right upper lobe, predominantly ground-glass attenuation, compatible with multifocal pneumonia, including atypical/viral etiologies. 2. Redemonstration of scattered small pulmonary nodules within the right lung, minimally increased compared to the previous study.  Nodules are almost certainly benign and some of which may reflect reactive intrapulmonary lymph nodes. 3. Mildly prominent mediastinal lymph nodes, minimally increased in size compared to the previous study, likely reactive. 4. New rounded 1.7 cm hyperdense nodule within the cutaneous and superficial subcutaneous soft tissues of the upper left back, possibly sebaceous or epidermoid cyst. Correlate with physical exam. 5. Cardiomegaly with aortic and coronary artery atherosclerosis (ICD10-I70.0). Electronically Signed   By: Davina Poke D.O.   On: 07/07/2022 12:01   DG Chest Port 1 View  Result Date: 07/07/2022 CLINICAL DATA:  Shortness of breath. EXAM: PORTABLE CHEST 1 VIEW COMPARISON:  01/24/2021 FINDINGS: 1025 hours. Low volume film. Stable asymmetric elevation right hemidiaphragm. Hazy opacity over the upper lungs is likely superimposed soft tissue. SIRT cardiomegaly Interstitial markings are diffusely coarsened with chronic features. Bones are diffusely demineralized. Spinal stimulator device again noted over the thoracic spine. Telemetry leads overlie the chest. IMPRESSION: Low volume film without acute cardiopulmonary findings. Hazy opacity overlying the upper lungs bilaterally is probably superimposed soft tissue. Dedicated standing upright PA and lateral chest x-ray is recommended when patient is able. Electronically Signed   By: Misty Stanley M.D.   On: 07/07/2022 10:58     The results of significant diagnostics from this hospitalization (including imaging, microbiology, ancillary and laboratory) are listed below for reference.     Microbiology: Recent Results (from the past 240 hour(s))  Resp Panel by RT-PCR (Flu A&B, Covid) Anterior Nasal Swab     Status: None   Collection Time: 07/07/22 10:55 AM   Specimen: Anterior Nasal Swab  Result Value Ref Range Status   SARS Coronavirus 2 by RT PCR NEGATIVE NEGATIVE Final    Comment: (NOTE) SARS-CoV-2 target nucleic acids are NOT  DETECTED.  The SARS-CoV-2 RNA is generally detectable  in upper respiratory specimens during the acute phase of infection. The lowest concentration of SARS-CoV-2 viral copies this assay can detect is 138 copies/mL. A negative result does not preclude SARS-Cov-2 infection and should not be used as the sole basis for treatment or other patient management decisions. A negative result may occur with  improper specimen collection/handling, submission of specimen other than nasopharyngeal swab, presence of viral mutation(s) within the areas targeted by this assay, and inadequate number of viral copies(<138 copies/mL). A negative result must be combined with clinical observations, patient history, and epidemiological information. The expected result is Negative.  Fact Sheet for Patients:  EntrepreneurPulse.com.au  Fact Sheet for Healthcare Providers:  IncredibleEmployment.be  This test is no t yet approved or cleared by the [place] FDA and  has been authorized for detection and/or diagnosis of SARS-CoV-2 by FDA under an Emergency Use Authorization (EUA). This EUA will remain  in effect (meaning this test can be used) for the duration of the COVID-19 declaration under Section 564(b)(1) of the Act, 21 U.S.C.section 360bbb-3(b)(1), unless the authorization is terminated  or revoked sooner.       Influenza A by PCR NEGATIVE NEGATIVE Final   Influenza B by PCR NEGATIVE NEGATIVE Final    Comment: (NOTE) The Xpert Xpress SARS-CoV-2/FLU/RSV plus assay is intended as an aid in the diagnosis of influenza from Nasopharyngeal swab specimens and should not be used as a sole basis for treatment. Nasal washings and aspirates are unacceptable for Xpert Xpress SARS-CoV-2/FLU/RSV testing.  Fact Sheet for Patients: EntrepreneurPulse.com.au  Fact Sheet for Healthcare Providers: IncredibleEmployment.be  This test is not yet  approved or cleared by the [place] FDA and has been authorized for detection and/or diagnosis of SARS-CoV-2 by FDA under an Emergency Use Authorization (EUA). This EUA will remain in effect (meaning this test can be used) for the duration of the COVID-19 declaration under Section 564(b)(1) of the Act, 21 U.S.C. section 360bbb-3(b)(1), unless the authorization is terminated or revoked.  Performed at [facility], [address] [place], [place] [address]   Respiratory (~20 pathogens) panel by PCR     Status: Abnormal   Collection Time: 07/07/22 12:33 PM   Specimen: Nasopharyngeal Swab; Respiratory  Result Value Ref Range Status   Adenovirus NOT DETECTED NOT DETECTED Final   Coronavirus 229E NOT DETECTED NOT DETECTED Final    Comment: (NOTE) The Coronavirus on the Respiratory Panel, DOES NOT test for the novel  Coronavirus (2019 nCoV)    Coronavirus HKU1 NOT DETECTED NOT DETECTED Final   Coronavirus NL63 NOT DETECTED NOT DETECTED Final   Coronavirus OC43 NOT DETECTED NOT DETECTED Final   Metapneumovirus NOT DETECTED NOT DETECTED Final   Rhinovirus / Enterovirus DETECTED (A) NOT DETECTED Final   Influenza A NOT DETECTED NOT DETECTED Final   Influenza B NOT DETECTED NOT DETECTED Final   Parainfluenza Virus 1 NOT DETECTED NOT DETECTED Final   Parainfluenza Virus 2 NOT DETECTED NOT DETECTED Final   Parainfluenza Virus 3 NOT DETECTED NOT DETECTED Final   Parainfluenza Virus 4 NOT DETECTED NOT DETECTED Final   Respiratory Syncytial Virus NOT DETECTED NOT DETECTED Final   Bordetella pertussis NOT DETECTED NOT DETECTED Final   Bordetella Parapertussis NOT DETECTED NOT DETECTED Final   Chlamydophila pneumoniae NOT DETECTED NOT DETECTED Final   Mycoplasma pneumoniae NOT DETECTED NOT DETECTED Final    Comment: Performed at [facility], [place]. [address] [place], [place] [address]  Resp Panel by RT-PCR (Flu A&B, Covid) Anterior  Nasal Swab     Status: None   Collection  Time: 07/11/22 10:41 PM   Specimen: Anterior Nasal Swab  Result Value Ref Range Status   SARS Coronavirus 2 by RT PCR NEGATIVE NEGATIVE Final    Comment: (NOTE) SARS-CoV-2 target nucleic acids are NOT DETECTED.  The SARS-CoV-2 RNA is generally detectable in upper respiratory specimens during the acute phase of infection. The lowest concentration of SARS-CoV-2 viral copies this assay can detect is 138 copies/mL. A negative result does not preclude SARS-Cov-2 infection and should not be used as the sole basis for treatment or other patient management decisions. A negative result may occur with  improper specimen collection/handling, submission of specimen other than nasopharyngeal swab, presence of viral mutation(s) within the areas targeted by this assay, and inadequate number of viral copies(<138 copies/mL). A negative result must be combined with clinical observations, patient history, and epidemiological information. The expected result is Negative.  Fact Sheet for Patients:  EntrepreneurPulse.com.au  Fact Sheet for Healthcare Providers:  IncredibleEmployment.be  This test is no t yet approved or cleared by the [place] FDA and  has been authorized for detection and/or diagnosis of SARS-CoV-2 by FDA under an Emergency Use Authorization (EUA). This EUA will remain  in effect (meaning this test can be used) for the duration of the COVID-19 declaration under Section 564(b)(1) of the Act, 21 U.S.C.section 360bbb-3(b)(1), unless the authorization is terminated  or revoked sooner.       Influenza A by PCR NEGATIVE NEGATIVE Final   Influenza B by PCR NEGATIVE NEGATIVE Final    Comment: (NOTE) The Xpert Xpress SARS-CoV-2/FLU/RSV plus assay is intended as an aid in the diagnosis of influenza from Nasopharyngeal swab specimens and should not be used as a sole basis for treatment. Nasal washings and aspirates are unacceptable for Xpert Xpress  SARS-CoV-2/FLU/RSV testing.  Fact Sheet for Patients: EntrepreneurPulse.com.au  Fact Sheet for Healthcare Providers: IncredibleEmployment.be  This test is not yet approved or cleared by the [place] FDA and has been authorized for detection and/or diagnosis of SARS-CoV-2 by FDA under an Emergency Use Authorization (EUA). This EUA will remain in effect (meaning this test can be used) for the duration of the COVID-19 declaration under Section 564(b)(1) of the Act, 21 U.S.C. section 360bbb-3(b)(1), unless the authorization is terminated or revoked.  Performed at [facility], [address] [place], [place] [address]      Labs: BNP (last 3 results) Recent Labs    07/11/22 1558  BNP 471.5*   Basic Metabolic Panel: Recent Labs  Lab 07/11/22 1558 07/13/22 0436 07/14/22 0311 07/15/22 0414  NA 139 145 140 138  K 3.7 3.7 3.9 4.0  CL 99 106 103 99  CO2 33* 35* 34* 34*  GLUCOSE 209* 85 85 106*  BUN _0 30*  CREATININE 0.70 0.79 0.80 0.94  CALCIUM 9.3 8.9 8.5* 9.2  MG  --   --  2.2 2.4   Liver Function Tests: Recent Labs  Lab 07/11/22 1558  AST 25  ALT 18  ALKPHOS 70  BILITOT 0.6  PROT 6.8  ALBUMIN 3.7   No results for input(s): "LIPASE", "AMYLASE" in the last 168 hours. No results for input(s): "AMMONIA" in the last 168 hours. CBC: Recent Labs  Lab 07/11/22 1558 07/13/22 0436 07/14/22 0311 07/15/22 0414  WBC 6.5 6.1 6.9 7.3  HGB 10.2* 9.2* 9.1* 9.5*  HCT 31.4* 28.8* 28.5* 28.8*  MCV 99.7 100.7* 101.1* 100.7*  PLT 188 186 177  200   Cardiac Enzymes: No results for input(s): "CKTOTAL", "CKMB", "CKMBINDEX", "TROPONINI" in the last 168 hours. BNP: Invalid input(s): "POCBNP" CBG: No results for input(s): "GLUCAP" in the last 168 hours. D-Dimer No results for input(s): "DDIMER" in the last 72 hours. Hgb A1c No results for input(s): "HGBA1C" in the last 72 hours. Lipid Profile No results for  input(s): "CHOL", "HDL", "LDLCALC", "TRIG", "CHOLHDL", "LDLDIRECT" in the last 72 hours. Thyroid function studies No results for input(s): "TSH", "T4TOTAL", "T3FREE", "THYROIDAB" in the last 72 hours.  Invalid input(s): "FREET3" Anemia work up No results for input(s): "VITAMINB12", "FOLATE", "FERRITIN", "TIBC", "IRON", "RETICCTPCT" in the last 72 hours. Urinalysis    Component Value Date/Time   COLORURINE YELLOW (A) 01/24/2021 1613   APPEARANCEUR CLEAR (A) 01/24/2021 1613   APPEARANCEUR Clear 12/08/2013 1435   LABSPEC 1.009 01/24/2021 1613   LABSPEC 1.009 12/08/2013 1435   PHURINE 7.0 01/24/2021 1613   GLUCOSEU NEGATIVE 01/24/2021 1613   GLUCOSEU NEGATIVE 02/10/2015 1330   HGBUR NEGATIVE 01/24/2021 [place] 01/24/2021 1613   BILIRUBINUR neg 07/03/2018 1607   BILIRUBINUR Negative 12/08/2013 1435   KETONESUR NEGATIVE 01/24/2021 1613   PROTEINUR NEGATIVE 01/24/2021 1613   UROBILINOGEN 0.2 07/03/2018 1607   UROBILINOGEN 0.2 02/10/2015 1330   NITRITE NEGATIVE 01/24/2021 1613   LEUKOCYTESUR NEGATIVE 01/24/2021 1613   LEUKOCYTESUR Negative 12/08/2013 1435   Sepsis Labs Recent Labs  Lab 07/11/22 1558 07/13/22 0436 07/14/22 0311 07/15/22 0414  WBC 6.5 6.1 6.9 7.3   Microbiology Recent Results (from the past 240 hour(s))  Resp Panel by RT-PCR (Flu A&B, Covid) Anterior Nasal Swab     Status: None   Collection Time: 07/07/22 10:55 AM   Specimen: Anterior Nasal Swab  Result Value Ref Range Status   SARS Coronavirus 2 by RT PCR NEGATIVE NEGATIVE Final    Comment: (NOTE) SARS-CoV-2 target nucleic acids are NOT DETECTED.  The SARS-CoV-2 RNA is generally detectable in upper respiratory specimens during the acute phase of infection. The lowest concentration of SARS-CoV-2 viral copies this assay can detect is 138 copies/mL. A negative result does not preclude SARS-Cov-2 infection and should not be used as the sole basis for treatment or other patient management  decisions. A negative result may occur with  improper specimen collection/handling, submission of specimen other than nasopharyngeal swab, presence of viral mutation(s) within the areas targeted by this assay, and inadequate number of viral copies(<138 copies/mL). A negative result must be combined with clinical observations, patient history, and epidemiological information. The expected result is Negative.  Fact Sheet for Patients:  EntrepreneurPulse.com.au  Fact Sheet for Healthcare Providers:  IncredibleEmployment.be  This test is no t yet approved or cleared by the [place] FDA and  has been authorized for detection and/or diagnosis of SARS-CoV-2 by FDA under an Emergency Use Authorization (EUA). This EUA will remain  in effect (meaning this test can be used) for the duration of the COVID-19 declaration under Section 564(b)(1) of the Act, 21 U.S.C.section 360bbb-3(b)(1), unless the authorization is terminated  or revoked sooner.       Influenza A by PCR NEGATIVE NEGATIVE Final   Influenza B by PCR NEGATIVE NEGATIVE Final    Comment: (NOTE) The Xpert Xpress SARS-CoV-2/FLU/RSV plus assay is intended as an aid in the diagnosis of influenza from Nasopharyngeal swab specimens and should not be used as a sole basis for treatment. Nasal washings and aspirates are unacceptable for Xpert Xpress SARS-CoV-2/FLU/RSV testing.  Fact Sheet for Patients: EntrepreneurPulse.com.au  Fact  Sheet for Healthcare Providers: IncredibleEmployment.be  This test is not yet approved or cleared by the [place] and has been authorized for detection and/or diagnosis of SARS-CoV-2 by FDA under an Emergency Use Authorization (EUA). This EUA will remain in effect (meaning this test can be used) for the duration of the COVID-19 declaration under Section 564(b)(1) of the Act, 21 U.S.C. section 360bbb-3(b)(1), unless the  authorization is terminated or revoked.  Performed at [facility], [address] [place], [place] [address]   Respiratory (~20 pathogens) panel by PCR     Status: Abnormal   Collection Time: 07/07/22 12:33 PM   Specimen: Nasopharyngeal Swab; Respiratory  Result Value Ref Range Status   Adenovirus NOT DETECTED NOT DETECTED Final   Coronavirus 229E NOT DETECTED NOT DETECTED Final    Comment: (NOTE) The Coronavirus on the Respiratory Panel, DOES NOT test for the novel  Coronavirus (2019 nCoV)    Coronavirus HKU1 NOT DETECTED NOT DETECTED Final   Coronavirus NL63 NOT DETECTED NOT DETECTED Final   Coronavirus OC43 NOT DETECTED NOT DETECTED Final   Metapneumovirus NOT DETECTED NOT DETECTED Final   Rhinovirus / Enterovirus DETECTED (A) NOT DETECTED Final   Influenza A NOT DETECTED NOT DETECTED Final   Influenza B NOT DETECTED NOT DETECTED Final   Parainfluenza Virus 1 NOT DETECTED NOT DETECTED Final   Parainfluenza Virus 2 NOT DETECTED NOT DETECTED Final   Parainfluenza Virus 3 NOT DETECTED NOT DETECTED Final   Parainfluenza Virus 4 NOT DETECTED NOT DETECTED Final   Respiratory Syncytial Virus NOT DETECTED NOT DETECTED Final   Bordetella pertussis NOT DETECTED NOT DETECTED Final   Bordetella Parapertussis NOT DETECTED NOT DETECTED Final   Chlamydophila pneumoniae NOT DETECTED NOT DETECTED Final   Mycoplasma pneumoniae NOT DETECTED NOT DETECTED Final    Comment: Performed at [facility], [place]. [address], [place], [place] [address]  Resp Panel by RT-PCR (Flu A&B, Covid) Anterior Nasal Swab     Status: None   Collection Time: 07/11/22 10:41 PM   Specimen: Anterior Nasal Swab  Result Value Ref Range Status   SARS Coronavirus 2 by RT PCR NEGATIVE NEGATIVE Final    Comment: (NOTE) SARS-CoV-2 target nucleic acids are NOT DETECTED.  The SARS-CoV-2 RNA is generally detectable in upper respiratory specimens during the acute phase of infection. The lowest concentration of  SARS-CoV-2 viral copies this assay can detect is 138 copies/mL. A negative result does not preclude SARS-Cov-2 infection and should not be used as the sole basis for treatment or other patient management decisions. A negative result may occur with  improper specimen collection/handling, submission of specimen other than nasopharyngeal swab, presence of viral mutation(s) within the areas targeted by this assay, and inadequate number of viral copies(<138 copies/mL). A negative result must be combined with clinical observations, patient history, and epidemiological information. The expected result is Negative.  Fact Sheet for Patients:  EntrepreneurPulse.com.au  Fact Sheet for Healthcare Providers:  IncredibleEmployment.be  This test is no t yet approved or cleared by the [place] FDA and  has been authorized for detection and/or diagnosis of SARS-CoV-2 by FDA under an Emergency Use Authorization (EUA). This EUA will remain  in effect (meaning this test can be used) for the duration of the COVID-19 declaration under Section 564(b)(1) of the Act, 21 U.S.C.section 360bbb-3(b)(1), unless the authorization is terminated  or revoked sooner.       Influenza A by PCR NEGATIVE NEGATIVE Final   Influenza B by PCR  NEGATIVE NEGATIVE Final    Comment: (NOTE) The Xpert Xpress SARS-CoV-2/FLU/RSV plus assay is intended as an aid in the diagnosis of influenza from Nasopharyngeal swab specimens and should not be used as a sole basis for treatment. Nasal washings and aspirates are unacceptable for Xpert Xpress SARS-CoV-2/FLU/RSV testing.  Fact Sheet for Patients: EntrepreneurPulse.com.au  Fact Sheet for Healthcare Providers: IncredibleEmployment.be  This test is not yet approved or cleared by the [place] FDA and has been authorized for detection and/or diagnosis of SARS-CoV-2 by FDA under an Emergency Use  Authorization (EUA). This EUA will remain in effect (meaning this test can be used) for the duration of the COVID-19 declaration under Section 564(b)(1) of the Act, 21 U.S.C. section 360bbb-3(b)(1), unless the authorization is terminated or revoked.  Performed at [facility], [address] [place], [place] [address]      Time coordinating discharge:  I have spent 35 minutes face to face with the patient and on the ward discussing the patients care, assessment, plan and disposition with other care givers. >50% of the time was devoted counseling the patient about the risks and benefits of treatment/Discharge disposition and coordinating care.   SIGNED:   Damita Lack, MD  [facility] 07/15/2022, 11:25 AM   If 7PM-7AM, please contact night-coverage

## 2022-07-15 NOTE — Care Management Important Message (Signed)
Important Message  Patient Details  Name: BAYLEY YARBOROUGH MRN: [id number] Date of Birth: 1931/03/19   Medicare Important Message Given:  Other (see comment)  Attempted to review Medicare IM with patient via room phone due to isolation status.  Unable to reach upon attempt.    Dannette Barbara 07/15/2022, 2:01 PM

## 2022-07-15 NOTE — Telephone Encounter (Signed)
Patient is being release from hospital today. Hospital Follow up scheduled for 07/19/2022 @ 11am.

## 2022-07-15 NOTE — TOC Progression Note (Signed)
Transition of Care [facility]) - Progression Note    Patient Details  Name: Kimberly Roy MRN: [id number] Date of Birth: Nov 06, 1930  Transition of [facility]) CM/SW [place], LCSW Phone Number: 07/15/2022, 10:35 AM  Clinical Narrative:  [facility] the [facility] health referral for PT and OT. Per [employer], their oxygen orders are for 2LPM nocturnal. Asked RN to see if she requires continuous oxygen.   Expected Discharge Plan: [place] Barriers to Discharge: Continued Medical Work up  Expected Discharge Plan and Services Expected Discharge Plan: [place] Choice: [place] arrangements for the past 2 months: Single Family Home Expected Discharge Date: 07/15/22                                     Social Determinants of Health (SDOH) Interventions    Readmission Risk Interventions     No data to display

## 2022-07-15 NOTE — TOC Transition Note (Addendum)
Transition of Care [facility]) - CM/SW Discharge Note   Patient Details  Name: Kimberly Roy MRN: [id number] Date of Birth: Jan 06, 1931  Transition of Care [facility]) CM/SW Contact:  Candie Chroman, LCSW Phone Number: 07/15/2022, 10:41 AM   Clinical Narrative:   Patient has orders to discharge home today. Per RN, patient does not qualify for continuous oxygen. No further concerns. CSW signing off.  1:59 pm: [employer] intake stated patient needed to stay overnight be recertified for nocturnal oxygen. CSW called Darnelle Bos, Regional VP of Sales for [employer] who stated based on information he was reviewing, she did not need to be recertified. Intake team sent information they had on file to recertification team. Patient and her daughter have been updated. CSW signing off.  Final next level of care: [place] Barriers to Discharge: Barriers Resolved   Patient Goals and CMS Choice Patient states their goals for this hospitalization and ongoing recovery are:: Home with [facility] services, caregiver in the home CMS Medicare.gov Compare Post Acute Care list provided to:: Patient Choice offered to / list presented to : Patient  Discharge Placement                    Patient and family notified of of transfer: 07/15/22  Discharge Plan and Services     Post Acute Care Choice: [place] Arranged: PT, OT [facility] Agency: [facility] [place] Date [facility] Agency Contacted: 07/15/22   Representative spoke with at [place]: Judson Roch  Social Determinants of Health ([place]) Interventions     Readmission Risk Interventions     No data to display

## 2022-07-15 NOTE — Progress Notes (Signed)
Physical Therapy Treatment Patient Details Name: Kimberly Roy MRN: [id number] DOB: 11/05/1930 Today's Date: 07/15/2022   History of Present Illness Kimberly Roy is a 88 y.o. female with medical history significant for COPD, pulmonary hypertension, OSA, diastolic CHF, with chronic exertional dyspnea on as needed O2 at 2 L, mild aortic stenosis, chronic anemia, HTN, lower extremity venous stasis, who presents to the ED for shortness of breath. CT chest showed multifocal pneumonia on the right side.    PT Comments    Pt in chair.  Feeling better today.  Does ask to stand to change position and for self care but declined walking at this time.  Awaiting discharge.  Standing quality and confidence improved today.  Feels comfortable with discharge.   Recommendations for follow up therapy are one component of a multi-disciplinary discharge planning process, led by the attending physician.  Recommendations may be updated based on patient status, additional functional criteria and insurance authorization.  Follow Up Recommendations  Home health PT     Assistance Recommended at Discharge PRN  Patient can return home with the following A little help with walking and/or transfers;A little help with bathing/dressing/bathroom;Assist for transportation;Help with stairs or ramp for entrance   Equipment Recommendations       Recommendations for Other Services       Precautions / Restrictions Precautions Precautions: Fall Restrictions Weight Bearing Restrictions: No     Mobility  Bed Mobility                    Transfers     Transfers: Sit to/from Stand Sit to Stand: Min guard                Ambulation/Gait                   Stairs             Wheelchair Mobility    Modified Rankin (Stroke Patients Only)       Balance Overall balance assessment: Needs assistance Sitting-balance support: No upper extremity supported, Feet supported Sitting  balance-Leahy Scale: Good     Standing balance support: Bilateral upper extremity supported, During functional activity Standing balance-Leahy Scale: Fair                              Cognition Arousal/Alertness: Awake/alert Behavior During Therapy: WFL for tasks assessed/performed Overall Cognitive Status: Within Functional Limits for tasks assessed                                          Exercises      General Comments        Pertinent Vitals/Pain Pain Assessment Pain Assessment: Faces Faces Pain Scale: Hurts a little bit Pain Location: back, R hip/buttocks Pain Descriptors / Indicators: Aching, Sore Pain Intervention(s): Limited activity within patient's tolerance, Monitored during session, Repositioned    Home Living                          Prior Function            PT Goals (current goals can now be found in the care plan section) Progress towards PT goals: Progressing toward goals    Frequency    Min 2X/week      PT Plan Current  plan remains appropriate    Co-evaluation              AM-PAC PT "6 Clicks" Mobility   Outcome Measure  Help needed turning from your back to your side while in a flat bed without using bedrails?: A Little Help needed moving from lying on your back to sitting on the side of a flat bed without using bedrails?: A Little Help needed moving to and from a bed to a chair (including a wheelchair)?: A Little Help needed standing up from a chair using your arms (e.g., wheelchair or bedside chair)?: A Little Help needed to walk in hospital room?: A Little Help needed climbing 3-5 steps with a railing? : A Lot 6 Click Score: 17    End of Session Equipment Utilized During Treatment: Gait belt Activity Tolerance: Patient tolerated treatment well Patient left: in chair;with call bell/phone within reach Nurse Communication: Mobility status PT Visit Diagnosis: Muscle weakness (generalized)  (M62.81);Difficulty in walking, not elsewhere classified (R26.2)     Time: 1540-0867 PT Time Calculation (min) (ACUTE ONLY): 10 min  Charges:  $Therapeutic Activity: 8-22 mins                   Chesley Noon, PTA 07/15/22, 12:48 PM

## 2022-07-16 ENCOUNTER — Telehealth: Payer: Self-pay

## 2022-07-16 NOTE — Telephone Encounter (Signed)
S/w Kimberly Roy - states she is better than she was when she was in office w/ Kimberly Roy. Still having congestion, and still hears some rattling with her breathe.  But feels she is breathing much better, and not coughing.   Advised to monitor symptoms and call if she does not keep improving.  Advised I will monitor for cancellations and let her know if we can get her in sooner.

## 2022-07-16 NOTE — Telephone Encounter (Signed)
Soni called from [facility] and states patient has refused PT and OT services.  Soni states patient is scheduled to be seen by Dr. Einar Pheasant on 07/19/2022, but would like to see if she can be seen sooner.  Soni states patient was in the hospital with pneumonia and still has a lot of congestion.  I let Soni know that we do not have an appointment available between now and 07/19/2022, but I will send his message to Dr. Nicki Reaper.  Soni states he will fax a request to [place] for orders for a home health nurse.

## 2022-07-16 NOTE — Telephone Encounter (Signed)
Transition Care Management Follow-up Telephone Call Date of discharge and from where: [place] 07-15-22 Dx: Pneumonia How have you been since you were released from the hospital? Not feeling so well  Any questions or concerns? PT from [facility] states that she is still really congested- mucus is yellow- he hears lower lobes rattles and right middle lobe-   Items Reviewed: Did the pt receive and understand the discharge instructions provided? Yes  Medications obtained and verified? Yes  Other? No  Any new allergies since your discharge? No  Dietary orders reviewed? Yes Do you have support at home? Yes   Home Care and Equipment/Supplies: Were home health services ordered? Yes PT/OT refused- HH wants nursing eval ordered  If so, what is the name of the agency? [facility]   Has the agency set up a time to come to the patient's home? yes Were any new equipment or medical supplies ordered?  No What is the name of the medical supply agency? [facility] Were you able to get the supplies/equipment? not applicable Do you have any questions related to the use of the equipment or supplies? No  Functional Questionnaire: (I = Independent and D = Dependent) ADLs: I-WITH ASSISTANCE   Bathing/Dressing- I- WITH ASSISTANCE   Meal Prep- D  Eating- I  Maintaining continence- I  Transferring/Ambulation- I-WALKER  Managing Meds- D  Follow up appointments reviewed:  PCP Hospital f/u appt confirmed? Yes  Scheduled to see Dr Nicki Reaper on 07-19-22 @ 11am.- pt is not on any antibiotics/ still congested and O2 levels are low 90s even with oxygen- please ask PCP if she can be worked in sooner than 07-19-22 to prevent rehospitalization  Specialist Hospital f/u appt confirmed? No . Are transportation arrangements needed? No  If their condition worsens, is the pt aware to call PCP or go to the Emergency Dept.? Yes Was the patient provided with contact information for the PCP's office or ED? Yes Was to pt  encouraged to call back with questions or concerns? Yes   Juanda Crumble LPN [place] Direct Dial [phone]

## 2022-07-16 NOTE — Telephone Encounter (Signed)
Per note, congestion in lungs, etc.  Confirm no increased sob or acute distress.  Will see if have cancellation.  If more acute symptoms, will need to be seen.

## 2022-07-17 NOTE — Telephone Encounter (Signed)
Ok for home health pt/ot - if pt will agree.  See other phone message regarding symptoms and f/u

## 2022-07-19 ENCOUNTER — Encounter: Payer: Self-pay | Admitting: Internal Medicine

## 2022-07-19 ENCOUNTER — Ambulatory Visit (INDEPENDENT_AMBULATORY_CARE_PROVIDER_SITE_OTHER): Payer: Medicare HMO | Admitting: Internal Medicine

## 2022-07-19 VITALS — BP 138/80 | HR 82 | Temp 98.6°F | Resp 17 | Ht 62.0 in | Wt 170.0 lb

## 2022-07-19 DIAGNOSIS — E611 Iron deficiency: Secondary | ICD-10-CM

## 2022-07-19 DIAGNOSIS — I700 Atherosclerosis of aorta: Secondary | ICD-10-CM

## 2022-07-19 DIAGNOSIS — D649 Anemia, unspecified: Secondary | ICD-10-CM | POA: Diagnosis not present

## 2022-07-19 DIAGNOSIS — I509 Heart failure, unspecified: Secondary | ICD-10-CM

## 2022-07-19 DIAGNOSIS — R2681 Unsteadiness on feet: Secondary | ICD-10-CM

## 2022-07-19 DIAGNOSIS — R062 Wheezing: Secondary | ICD-10-CM | POA: Diagnosis not present

## 2022-07-19 DIAGNOSIS — J189 Pneumonia, unspecified organism: Secondary | ICD-10-CM | POA: Diagnosis not present

## 2022-07-19 DIAGNOSIS — E033 Postinfectious hypothyroidism: Secondary | ICD-10-CM | POA: Diagnosis not present

## 2022-07-19 DIAGNOSIS — E538 Deficiency of other specified B group vitamins: Secondary | ICD-10-CM

## 2022-07-19 DIAGNOSIS — I272 Pulmonary hypertension, unspecified: Secondary | ICD-10-CM

## 2022-07-19 DIAGNOSIS — K219 Gastro-esophageal reflux disease without esophagitis: Secondary | ICD-10-CM

## 2022-07-19 DIAGNOSIS — E785 Hyperlipidemia, unspecified: Secondary | ICD-10-CM

## 2022-07-19 DIAGNOSIS — J9611 Chronic respiratory failure with hypoxia: Secondary | ICD-10-CM

## 2022-07-19 DIAGNOSIS — I10 Essential (primary) hypertension: Secondary | ICD-10-CM

## 2022-07-19 DIAGNOSIS — I5032 Chronic diastolic (congestive) heart failure: Secondary | ICD-10-CM | POA: Diagnosis not present

## 2022-07-19 DIAGNOSIS — I872 Venous insufficiency (chronic) (peripheral): Secondary | ICD-10-CM

## 2022-07-19 DIAGNOSIS — I350 Nonrheumatic aortic (valve) stenosis: Secondary | ICD-10-CM

## 2022-07-19 LAB — CBC WITH DIFFERENTIAL/PLATELET
Basophils Absolute: 0 10*3/uL (ref 0.0–0.1)
Basophils Relative: 0.4 % (ref 0.0–3.0)
Eosinophils Absolute: 0.3 10*3/uL (ref 0.0–0.7)
Eosinophils Relative: 4.4 % (ref 0.0–5.0)
HCT: 30.6 % — ABNORMAL LOW (ref 36.0–46.0)
Hemoglobin: 10 g/dL — ABNORMAL LOW (ref 12.0–15.0)
Lymphocytes Relative: 21.4 % (ref 12.0–46.0)
Lymphs Abs: 1.6 10*3/uL (ref 0.7–4.0)
MCHC: 32.8 g/dL (ref 30.0–36.0)
MCV: 101.1 fl — ABNORMAL HIGH (ref 78.0–100.0)
Monocytes Absolute: 0.5 10*3/uL (ref 0.1–1.0)
Monocytes Relative: 7.5 % (ref 3.0–12.0)
Neutro Abs: 4.8 10*3/uL (ref 1.4–7.7)
Neutrophils Relative %: 66.3 % (ref 43.0–77.0)
Platelets: 232 10*3/uL (ref 150.0–400.0)
RBC: 3.02 Mil/uL — ABNORMAL LOW (ref 3.87–5.11)
RDW: 14.7 % (ref 11.5–15.5)
WBC: 7.3 10*3/uL (ref 4.0–10.5)

## 2022-07-19 LAB — BASIC METABOLIC PANEL
BUN: 21 mg/dL (ref 6–23)
CO2: 33 mEq/L — ABNORMAL HIGH (ref 19–32)
Calcium: 9.7 mg/dL (ref 8.4–10.5)
Chloride: 100 mEq/L (ref 96–112)
Creatinine, Ser: 0.95 mg/dL (ref 0.40–1.20)
GFR: 53.17 mL/min — ABNORMAL LOW (ref >60.00)
Glucose, Bld: 92 mg/dL (ref 70–99)
Potassium: 5.2 mEq/L — ABNORMAL HIGH (ref 3.5–5.1)
Sodium: 140 mEq/L (ref 135–145)

## 2022-07-19 LAB — IBC + FERRITIN
Ferritin: 137 ng/mL (ref 10.0–291.0)
Iron: 93 ug/dL (ref 42–145)
Saturation Ratios: 41.5 % (ref 20.0–50.0)
TIBC: 224 ug/dL — ABNORMAL LOW (ref 250.0–450.0)
Transferrin: 160 mg/dL — ABNORMAL LOW (ref 212.0–360.0)

## 2022-07-19 MED ORDER — LEVALBUTEROL HCL 0.63 MG/3ML IN NEBU: 0.6300 mg | INHALATION_SOLUTION | Freq: Four times a day (QID) | RESPIRATORY_TRACT | 1 refills | Status: DC | PRN

## 2022-07-19 MED ORDER — ALBUTEROL SULFATE (2.5 MG/3ML) 0.083% IN NEBU: 2.5000 mg | INHALATION_SOLUTION | Freq: Four times a day (QID) | RESPIRATORY_TRACT | 1 refills | Status: DC | PRN

## 2022-07-19 NOTE — Progress Notes (Unsigned)
Patient ID: Kimberly Roy, female   DOB: Jun 13, 1931, 88 y.o.   MRN: [id number]   Subjective:    Patient ID: Kimberly Roy, female    DOB: 1931-04-27, 88 y.o.   MRN: [id number]   Patient here for hospital follow up.  Marland Kitchen   HPI Hospital follow up.  She is accompanied by her daughter.  History obtained from both of them.  Admitted 07/11/22 - 07/15/22 - after presenting with increased sob and cough. PTAdimiission, she was evaluated and CT chest that showed a multifocal pneumonia on the right side but other work-up was unremarkable with normal WBC and negative procalcitonin.  Treated with doxycycline, prednisone and augmentin.  Continued to have increased cough and sob - back to ER and admitted.  Respiratory panel - positive for rhinovirus.  Abx stopped. Given Nebs and Flutter valve.  Oxygen weaned.  Elevated BNP - ECHO 60% with G1DD.  Diagnosed with acute on chronic diastolic CHF.  Was diuresed.  Symptoms improved.  Discharged home with nocturnal oxygen and plans for PT/OT.  Discussed home health PT/OT and nursing.  Pt in agreement.  States she does feel better, but sill with increased congestion and cough.  Using inhaler tid.  No chest pain.  Eating.  No vomiting.  Still weak from above.  Needs therapy.     Past Medical History:  Diagnosis Date   Anemia    Anxiety    Chest pain    CHF (congestive heart failure) (HCC)    Constipation    DDD (degenerative disc disease), cervical    Depression    DVT (deep venous thrombosis) (HCC)    Dysphonia    Dyspnea    Fatty liver    Fatty liver    Headache    Hyperlipidemia    Hyperpiesia    Hypertension    Hypothyroidism    Interstitial cystitis    Left ventricular dysfunction    Lymphedema    Nephrolithiasis    Obstructive sleep apnea    Osteoarthritis    knees/cervical and lumbar spine   Pulmonary hypertension (HCC)    Pulmonary nodules    followed by Dr Raul Del   Pure hypercholesterolemia    Renal cyst    right   Past Surgical History:   Procedure Laterality Date   ABDOMINAL HYSTERECTOMY     ovaries left in place   APPENDECTOMY     Back Surgeries     BACK SURGERY     BREAST REDUCTION SURGERY     3/99   CARDIAC CATHETERIZATION     cataracts Bilateral    CERVICAL SPINE SURGERY     ESOPHAGEAL MANOMETRY N/A 08/02/2015   Procedure: ESOPHAGEAL MANOMETRY (EM);  Surgeon: Josefine Class, MD;  Location: [facility] ENDOSCOPY;  Service: Endoscopy;  Laterality: N/A;   ESOPHAGOGASTRODUODENOSCOPY N/A 02/27/2015   Procedure: ESOPHAGOGASTRODUODENOSCOPY (EGD);  Surgeon: Hulen Luster, MD;  Location: [facility] ENDOSCOPY;  Service: Gastroenterology;  Laterality: N/A;   ESOPHAGOGASTRODUODENOSCOPY (EGD) WITH PROPOFOL N/A 10/30/2018   Procedure: ESOPHAGOGASTRODUODENOSCOPY (EGD) WITH PROPOFOL;  Surgeon: Lollie Sails, MD;  Location: [facility] ENDOSCOPY;  Service: Endoscopy;  Laterality: N/A;   EXCISIONAL HEMORRHOIDECTOMY     EYE SURGERY     FRACTURE SURGERY     HEMORRHOID SURGERY     HIP SURGERY  2013   Right hip surgery   JOINT REPLACEMENT     KNEE ARTHROSCOPY     left and right   ORIF FEMUR FRACTURE Right 08/07/2018   Procedure: OPEN REDUCTION INTERNAL  FIXATION (ORIF) DISTAL FEMUR FRACTURE;  Surgeon: Hessie Knows, MD;  Location: [facility] ORS;  Service: Orthopedics;  Laterality: Right;   REDUCTION MAMMAPLASTY Bilateral YRS AGO   REPLACEMENT TOTAL KNEE Bilateral    rotator cuff surgery     blilateral   TONSILECTOMY/ADENOIDECTOMY WITH MYRINGOTOMY     VISCERAL ARTERY INTERVENTION N/A 02/25/2019   Procedure: VISCERAL ARTERY INTERVENTION;  Surgeon: Algernon Huxley, MD;  Location: [place] [facility];  Service: Cardiovascular;  Laterality: N/A;   Family History  Problem Relation Age of Onset   Heart disease Mother    Stroke Mother    Hypertension Mother    Heart disease Father        myocardial infarction age 39   Breast cancer Neg Hx    Social History   Socioeconomic History   Marital status: Widowed    Spouse name: Not on file   Number of  children: 2   Years of education: 64   Highest education level: 12th grade  Occupational History    Comment: insurance agency  Tobacco Use   Smoking status: Never   Smokeless tobacco: Never  Vaping Use   Vaping Use: Never used  Substance and Sexual Activity   Alcohol use: No    Alcohol/week: 0.0 standard drinks of alcohol   Drug use: Never   Sexual activity: Not Currently  Other Topics Concern   Not on file  Social History Narrative   Not on file   Social Determinants of Health   Financial Resource Strain: Low Risk  (03/09/2021)   Overall Financial Resource Strain (CARDIA)    Difficulty of Paying Living Expenses: Not hard at all  Food Insecurity: No Food Insecurity (07/12/2022)   Hunger Vital Sign    Worried About Running Out of Food in the Last Year: Never true    [place] in the Last Year: Never true  Transportation Needs: Unmet Transportation Needs (07/12/2022)   PRAPARE - Transportation    Lack of Transportation (Medical): Yes    Lack of Transportation (Non-Medical): Yes  Physical Activity: Inactive (03/09/2021)   Exercise Vital Sign    Days of Exercise per Week: 0 days    Minutes of Exercise per Session: 0 min  Stress: No Stress Concern Present (03/09/2021)   [place]    Feeling of Stress : Only a little  Social Connections: Socially Isolated (11/15/2021)   Social Connection and Isolation Panel [NHANES]    Frequency of Communication with Friends and Family: Three times a week    Frequency of Social Gatherings with Friends and Family: Twice a week    Attends Religious Services: Never    Marine scientist or Organizations: No    Attends Archivist Meetings: Never    Marital Status: Widowed     Review of Systems  Constitutional:  Positive for fatigue. Negative for appetite change and fever.  HENT:  Negative for sinus pressure.   Respiratory:  Positive for cough. Negative for  chest tightness.        Increased chest congestion.   Cardiovascular:  Negative for chest pain and palpitations.       No increased swelling.   Gastrointestinal:  Negative for abdominal pain, diarrhea and vomiting.  Genitourinary:  Negative for difficulty urinating and dysuria.  Musculoskeletal:  Negative for joint swelling and myalgias.  Skin:  Negative for color change and rash.  Neurological:  Negative for dizziness and headaches.  Psychiatric/Behavioral:  Negative for agitation and dysphoric mood.        Objective:     BP 138/80 (BP Location: Left Arm, Patient Position: Sitting, Cuff Size: Large)   Pulse 82   Temp 98.6 F (37 C) (Temporal)   Resp 17   Ht _0  (1.575 m)   Wt 170 lb (77.1 kg)   SpO2 97%   BMI 31.09 kg/m  Wt Readings from Last 3 Encounters:  07/19/22 170 lb (77.1 kg)  07/11/22 174 lb (78.9 kg)  07/07/22 180 lb (81.6 kg)    Physical Exam Vitals reviewed.  Constitutional:      General: She is not in acute distress.    Appearance: Normal appearance.  HENT:     Head: Normocephalic and atraumatic.     Right Ear: External ear normal.     Left Ear: External ear normal.  Eyes:     General: No scleral icterus.       Right eye: No discharge.        Left eye: No discharge.     Conjunctiva/sclera: Conjunctivae normal.  Neck:     Thyroid: No thyromegaly.  Cardiovascular:     Rate and Rhythm: Normal rate and regular rhythm.  Pulmonary:     Effort: No respiratory distress.     Comments: No increased sob with talking.  Still with increased congestion.  increased air movement.  Abdominal:     General: Bowel sounds are normal.     Palpations: Abdomen is soft.     Tenderness: There is no abdominal tenderness.  Musculoskeletal:        General: No tenderness.     Cervical back: Neck supple. No tenderness.     Comments: No increased swelling.   Lymphadenopathy:     Cervical: No cervical adenopathy.  Skin:    Findings: No erythema or rash.  Neurological:      Mental Status: She is alert.  Psychiatric:        Mood and Affect: Mood normal.        Behavior: Behavior normal.      Outpatient Encounter Medications as of 07/19/2022  Medication Sig   albuterol (VENTOLIN HFA) 108 (90 Base) MCG/ACT inhaler INHALE 2 PUFFS INTO THE LUNGS EVERY 6 HOURS AS NEEDED FOR WHEEZING OR SHORTNESS OF BREATH   aluminum-magnesium hydroxide-simethicone (MAALOX) 200-200-20 MG/5ML SUSP Take 15 mLs by mouth 4 (four) times daily -  before meals and at bedtime.    amLODipine (NORVASC) 10 MG tablet TAKE 1 TABLET BY MOUTH DAILY   aspirin 81 MG EC tablet Take 81 mg by mouth daily. Every other day   atorvastatin (LIPITOR) 20 MG tablet TAKE 1 TABLET BY MOUTH AT BEDTIME   benazepril (LOTENSIN) 40 MG tablet TAKE 1 TABLET BY MOUTH DAILY   betamethasone dipropionate 0.05 % cream Apply 1 application topically 2 (two) times daily.   busPIRone (BUSPAR) 10 MG tablet Take 10 mg by mouth 3 (three) times daily.   calcium citrate-vitamin D (CITRACAL+D) 315-200 MG-UNIT tablet Take 2 tablets by mouth daily.   Cholecalciferol (VITAMIN D3) 1000 UNITS CAPS Take 1 capsule by mouth daily.   clobetasol cream (TEMOVATE) 1.01 % Apply 1 application topically See admin instructions. Apply to affected areas of body 1 - 2 times daily as needed for itchy bumps. Avoid applying to face, groin, and axilla. Use as directed.   cyanocobalamin (VITAMIN B12) 1000 MCG/ML injection Inject 1 mL (1,000 mcg total) into the muscle every 3 (three) months.  Diclofenac Sodium 3 % GEL Apply topically 2 (two) times daily.   Docusate Sodium (DSS) 100 MG CAPS Take 100 mg by mouth 2 (two) times daily.   DULoxetine (CYMBALTA) 60 MG capsule Take 60 mg by mouth daily.   Fluocinolone Acetonide 0.01 % OIL Apply 1-2 drops into ears once to twice daily as needed.   fluocinonide (LIDEX) 0.05 % external solution Apply topically 2 (two) times daily. Apply drops to scalp for itch   furosemide (LASIX) 20 MG tablet TAKE 1 TABLET BY  MOUTH DAILY AS NEEDED   gabapentin (NEURONTIN) 300 MG capsule Take two tablets tid (in am, midday and q hs).   HYDROcodone-acetaminophen (NORCO) 10-325 MG tablet Take 1 tablet by mouth 3 (three) times daily.   Infant Care Products [facility]) OINT Apply topically as directed (as discussed).   ipratropium (ATROVENT) 0.03 % nasal spray Place 2 sprays into both nostrils every 12 (twelve) hours.   ketoconazole (NIZORAL) 2 % shampoo Apply 1 application topically See admin instructions. apply three times per week, massage into scalp and leave in for 10 minutes before rinsing out   LINZESS 145 MCG CAPS capsule Take 145 mcg by mouth daily.   magnesium oxide (MAG-OX) 400 MG tablet Take 1 tablet (400 mg total) by mouth daily.   metoprolol succinate (TOPROL-XL) 50 MG 24 hr tablet TAKE 1 TABLET BY MOUTH DAILY. TAKE WITH OR IMMEDIATELY FOLLOWING A MEAL.   morphine (MS CONTIN) 30 MG 12 hr tablet Take 30 mg by mouth every 12 (twelve) hours.   morphine (MS CONTIN) 60 MG 12 hr tablet Take 1 tablet (60 mg total) by mouth 2 (two) times daily.   Naldemedine Tosylate 0.2 MG TABS Take 0.2 mg by mouth daily.   nystatin (MYCOSTATIN/NYSTOP) powder Apply 1 application. topically 2 (two) times daily as needed.   nystatin cream (MYCOSTATIN) Apply 1 Application topically 2 (two) times daily.   polyethylene glycol powder (GLYCOLAX/MIRALAX) 17 GM/SCOOP powder MIX 17 GRAMS AS MARKED ON BOTTLE TOP IN 8 OUNCES OF WATER AND DRINK ONCE A DAY AS DIRECTED.   RABEprazole (ACIPHEX) 20 MG tablet TAKE 1 TABLET BY MOUTH TWICE A DAY BEFORE A MEAL   senna (SENOKOT) 8.6 MG TABS tablet Take 1 tablet by mouth 2 (two) times daily.   Simethicone (GAS-X PO) Take 2 tablets by mouth 2 (two) times daily.   sodium chloride (OCEAN) 0.65 % nasal spray Place 1 spray into the nose as needed.   SYNTHROID 100 MCG tablet Take 1 tablet (100 mcg total) by mouth daily.   triamcinolone ointment (KENALOG) 0.1 % APPLY TWICE DAILY TO BITES AND RASH UNTIL FLAT AND  SMOOTH **DO NOT APPLY TO FACE**   [DISCONTINUED] albuterol (PROVENTIL) (2.5 MG/3ML) 0.083% nebulizer solution Take 3 mLs (2.5 mg total) by nebulization every 6 (six) hours as needed for wheezing or shortness of breath.   [DISCONTINUED] levalbuterol (XOPENEX) 0.63 MG/3ML nebulizer solution Take 3 mLs (0.63 mg total) by nebulization every 6 (six) hours as needed for wheezing or shortness of breath.   levalbuterol (XOPENEX) 0.63 MG/3ML nebulizer solution Take 3 mLs (0.63 mg total) by nebulization every 6 (six) hours as needed for wheezing or shortness of breath.   [DISCONTINUED] cetirizine (ZYRTEC) 10 MG tablet Take 1 tablet (10 mg total) by mouth daily. (Patient not taking: Reported on 07/12/2022)   No facility-administered encounter medications on file as of 07/19/2022.     Lab Results  Component Value Date   WBC 7.3 07/19/2022   HGB 10.0 (L)  07/19/2022   HCT 30.6 (L) 07/19/2022   PLT 232.0 07/19/2022   GLUCOSE 92 07/19/2022   CHOL 143 01/21/2022   TRIG 108.0 01/21/2022   HDL 57.90 01/21/2022   LDLCALC 63 01/21/2022   ALT 18 07/11/2022   AST 25 07/11/2022   NA 140 07/19/2022   K 5.2 No hemolysis seen (H) 07/19/2022   CL 100 07/19/2022   CREATININE 0.95 07/19/2022   BUN 21 07/19/2022   CO2 33 (H) 07/19/2022   TSH 4.63 01/21/2022   INR 0.89 08/06/2018   HGBA1C 6.0 10/19/2014    ECHOCARDIOGRAM COMPLETE  Result Date: 07/14/2022    ECHOCARDIOGRAM REPORT   Patient Name:   [facility] Date of Exam: 07/14/2022 Medical Rec #:  [id number]   Height:       62.0 in Accession #:    [id number]  Weight:       174.0 lb Date of Birth:  12-14-1930    BSA:          1.802 m Patient Age:    23 years    BP:           126/45 mmHg Patient Gender: F           HR:           70 bpm. Exam Location:  [facility] Procedure: 2D Echo Indications:     Cardiomyopathy I42.9  History:         Patient has no prior history of Echocardiogram examinations.  Sonographer:     Kathlen Brunswick RDCS Referring Phys:  [id number] Jeanella Flattery AMIN Diagnosing Phys: Isaias Cowman MD  Sonographer Comments: Technically difficult study due to poor echo windows and suboptimal apical window. Image acquisition challenging due to respiratory motion. IMPRESSIONS  1. Left ventricular ejection fraction, by estimation, is 60 to 65%. The left ventricle has normal function. The left ventricle has no regional wall motion abnormalities. There is mild left ventricular hypertrophy. Left ventricular diastolic parameters are consistent with Grade I diastolic dysfunction (impaired relaxation).  2. Right ventricular systolic function is normal. The right ventricular size is normal.  3. The mitral valve is normal in structure. Mild mitral valve regurgitation. No evidence of mitral stenosis.  4. Mild to moderate tricuspid stenosis.  5. The aortic valve is normal in structure. Aortic valve regurgitation is mild. Mild aortic valve stenosis.  6. The inferior vena cava is normal in size with greater than 50% respiratory variability, suggesting right atrial pressure of 3 mmHg. FINDINGS  Left Ventricle: Left ventricular ejection fraction, by estimation, is 60 to 65%. The left ventricle has normal function. The left ventricle has no regional wall motion abnormalities. The left ventricular internal cavity size was normal in size. There is  mild left ventricular hypertrophy. Left ventricular diastolic parameters are consistent with Grade I diastolic dysfunction (impaired relaxation). Right Ventricle: The right ventricular size is normal. No increase in right ventricular wall thickness. Right ventricular systolic function is normal. Left Atrium: Left atrial size was normal in size. Right Atrium: Right atrial size was normal in size. Pericardium: There is no evidence of pericardial effusion. Mitral Valve: The mitral valve is normal in structure. Mild mitral valve regurgitation. No evidence of mitral valve stenosis. Tricuspid Valve: The tricuspid valve is normal in structure.  Tricuspid valve regurgitation is mild . Mild to moderate tricuspid stenosis. Aortic Valve: The aortic valve is normal in structure. Aortic valve regurgitation is mild. Aortic regurgitation PHT measures 606 msec. Mild aortic stenosis is present. Aortic  valve mean gradient measures 11.0 mmHg. Aortic valve peak gradient measures 24.8 mmHg. Aortic valve area, by VTI measures 1.24 cm. Pulmonic Valve: The pulmonic valve was normal in structure. Pulmonic valve regurgitation is not visualized. No evidence of pulmonic stenosis. Aorta: The aortic root is normal in size and structure. Venous: The inferior vena cava is normal in size with greater than 50% respiratory variability, suggesting right atrial pressure of 3 mmHg. IAS/Shunts: No atrial level shunt detected by color flow Doppler.  LEFT VENTRICLE PLAX 2D LVIDd:         4.60 cm   Diastology LVIDs:         3.00 cm   LV e' medial:    6.31 cm/s LV PW:         1.40 cm   LV E/e' medial:  11.5 LV IVS:        1.20 cm   LV e' lateral:   7.94 cm/s LVOT diam:     1.80 cm   LV E/e' lateral: 9.2 LV SV:         70 LV SV Index:   39 LVOT Area:     2.54 cm  LEFT ATRIUM             Index LA diam:        4.70 cm 2.61 cm/m LA Vol (A2C):   30.2 ml 16.76 ml/m LA Vol (A4C):   19.4 ml 10.77 ml/m LA Biplane Vol: 25.5 ml 14.15 ml/m  AORTIC VALVE                     PULMONIC VALVE AV Area (Vmax):    1.10 cm      PV Vmax:          1.27 m/s AV Area (Vmean):   1.17 cm      PV Peak grad:     6.5 mmHg AV Area (VTI):     1.24 cm      PR End Diast Vel: 5.48 msec AV Vmax:           249.00 cm/s AV Vmean:          145.000 cm/s AV VTI:            0.563 m AV Peak Grad:      24.8 mmHg AV Mean Grad:      11.0 mmHg LVOT Vmax:         108.00 cm/s LVOT Vmean:        66.500 cm/s LVOT VTI:          0.274 m LVOT/AV VTI ratio: 0.49 AI PHT:            606 msec  AORTA Ao Root diam: 3.10 cm Ao Asc diam:  3.10 cm MITRAL VALVE               TRICUSPID VALVE MV Area (PHT): 2.21 cm    TR Peak grad:   41.2 mmHg MV  Decel Time: 343 msec    TR Vmax:        321.00 cm/s MV E velocity: 72.80 cm/s MV A velocity: 95.40 cm/s  SHUNTS MV E/A ratio:  0.76        Systemic VTI:  0.27 m                            Systemic Diam: 1.80 cm Isaias Cowman MD Electronically signed by Isaias Cowman MD Signature  Date/Time: 07/14/2022/12:34:06 PM    Final        Assessment & Plan:   Problem List Items Addressed This Visit     Anemia    Saw Dr Rogue Bussing 04/12/22 - previous rash to IV venofer.  Oral iron daily. Needs B12 injections. Recheck cbc today.  Hgb noted to be a little more decreased during hospitalization.        Relevant Orders   CBC with Differential/Platelet (Completed)   IBC + Ferritin (Completed)   Aortic atherosclerosis (HCC)    Continue lipitor.       Aortic stenosis    Just had echo in hospital.  Mild.  Followed by Dr Saralyn Pilar.       B12 deficiency   CHF (congestive heart failure) (HCC)    Elevated BNP - ECHO 60% with G1DD.  Diagnosed with acute on chronic diastolic CHF.  Was diuresed.  Symptoms improved. Takes lasix daily.  Weight is down.  No evidence of volume overload on exam.  Continue daily lasix.  Check metabolic panel.       Chronic diastolic CHF (congestive heart failure) (HCC)   Relevant Orders   Basic metabolic panel (Completed)   Chronic respiratory failure with hypoxia (HCC)    Normally uses oxygen at night.  Continue oxygen.        Relevant Orders   Ambulatory referral to [place]   Chronic venous insufficiency    Continue compression hose.  Swelling overall improved.       GERD (gastroesophageal reflux disease)    Continue aciphex.  Followed by GI.       Hyperlipidemia    On lipitor.  Low cholesterol diet and exercise.  Follow lipid panel and liver function tests.        Hypertension    Blood pressure as outlined.  Continue benazepril, amlodipine, lasix and metoprolol.  Follow pressures.  Follow metabolic panel.       Hypothyroidism    On thyroid  replacement.  Follow tsh.       Iron deficiency    Continues oral iron.       Pneumonia - Primary    Admitted 07/11/22 - 07/15/22 - after presenting with increased sob and cough. PTAdimiission, she was evaluated and CT chest that showed a multifocal pneumonia on the right side but other work-up was unremarkable with normal WBC and negative procalcitonin.  Treated with doxycycline, prednisone and augmentin.  Continued to have increased cough and sob - back to ER and admitted.  Respiratory panel - positive for rhinovirus.  Abx stopped. Given Nebs and Flutter valve.  Oxygen weaned. Discharged home with nocturnal oxygen and plans for PT/OT.  Discussed home health PT/OT and nursing.  Pt in agreement.  Breathing overall is better.  Still with increased congestion and cough.  Discussed neb treatments. She is using inhaler tid.  Xopenex neb as directed.  Robitussin/mucinex as directed.  Hold on further abx.  Follow.  Call with update.  Contact home health to arrange nursing, PT/OT.  Will need f/u cxr.       Relevant Medications   levalbuterol (XOPENEX) 0.63 MG/3ML nebulizer solution   Other Relevant Orders   Ambulatory referral to [place]   For home use only DME Nebulizer machine   Pulmonary hypertension ([place])    Followed by pulmonary.  Continue nighttime oxygen.       Unsteady gait    With recent admission.  Weakness.  Using walking.  Needs PT to evaluate and treat -  for strengthening and gait stability.       Other Visit Diagnoses     Wheezing       Relevant Orders   Ambulatory referral to [place]   For home use only DME Nebulizer machine        Einar Pheasant, MD

## 2022-07-21 ENCOUNTER — Encounter: Payer: Self-pay | Admitting: Internal Medicine

## 2022-07-21 ENCOUNTER — Telehealth: Payer: Self-pay | Admitting: Internal Medicine

## 2022-07-21 NOTE — Assessment & Plan Note (Signed)
Blood pressure as outlined.  Continue benazepril, amlodipine, lasix and metoprolol.  Follow pressures.  Follow metabolic panel.

## 2022-07-21 NOTE — Assessment & Plan Note (Signed)
Normally uses oxygen at night.  Continue oxygen.

## 2022-07-21 NOTE — Assessment & Plan Note (Signed)
Continue lipitor.

## 2022-07-21 NOTE — Assessment & Plan Note (Signed)
On lipitor.  Low cholesterol diet and exercise.  Follow lipid panel and liver function tests.   

## 2022-07-21 NOTE — Assessment & Plan Note (Addendum)
Admitted 07/11/22 - 07/15/22 - after presenting with increased sob and cough. PTAdimiission, she was evaluated and CT chest that showed a multifocal pneumonia on the right side but other work-up was unremarkable with normal WBC and negative procalcitonin.  Treated with doxycycline, prednisone and augmentin.  Continued to have increased cough and sob - back to ER and admitted.  Respiratory panel - positive for rhinovirus.  Abx stopped. Given Nebs and Flutter valve.  Oxygen weaned. Discharged home with nocturnal oxygen and plans for PT/OT.  Discussed home health PT/OT and nursing.  Pt in agreement.  Breathing overall is better.  Still with increased congestion and cough.  Discussed neb treatments. She is using inhaler tid.  Xopenex neb as directed.  Robitussin/mucinex as directed.  Hold on further abx.  Follow.  Call with update.  Contact home health to arrange nursing, PT/OT.  Will need f/u cxr.

## 2022-07-21 NOTE — Assessment & Plan Note (Signed)
Saw Dr Rogue Bussing 04/12/22 - previous rash to IV venofer.  Oral iron daily. Needs B12 injections. Recheck cbc today.  Hgb noted to be a little more decreased during hospitalization.

## 2022-07-21 NOTE — Assessment & Plan Note (Signed)
On thyroid replacement.  Follow tsh.  

## 2022-07-21 NOTE — Assessment & Plan Note (Signed)
Followed by pulmonary.  Continue nighttime oxygen.

## 2022-07-21 NOTE — Assessment & Plan Note (Signed)
Just had echo in hospital.  Mild.  Followed by Dr Saralyn Pilar.

## 2022-07-21 NOTE — Telephone Encounter (Signed)
Was in office Friday for hospital follow up.  While in hospital, home health was contacted for PT/OT.  PT evaluated and recommended nursing.  Per discussion with her at her appt, she is agreeable to home health nursing, PT and OT.  Per note, home health agency was [facility].  Please contact [facility] and confirm they are seeing her and need nursing, PT and OT services.  My note is in EPIC.  Also, I had prescribed nebulizer (Xopenex).  They were unable to get the machine at apothecary.  I printed DME.  Informed would need to get at medical supply store.  [facility] or medical supply store of their choice.  Order in box.  Need to confirm how she is doing.  If any change or worsening symptoms, will need to be reevaluated.

## 2022-07-21 NOTE — Assessment & Plan Note (Signed)
Elevated BNP - ECHO 60% with G1DD.  Diagnosed with acute on chronic diastolic CHF.  Was diuresed.  Symptoms improved. Takes lasix daily.  Weight is down.  No evidence of volume overload on exam.  Continue daily lasix.  Check metabolic panel.

## 2022-07-21 NOTE — Assessment & Plan Note (Signed)
Continue compression hose.  Swelling overall improved.

## 2022-07-21 NOTE — Assessment & Plan Note (Signed)
Continues oral iron.

## 2022-07-21 NOTE — Assessment & Plan Note (Signed)
With recent admission.  Weakness.  Using walking.  Needs PT to evaluate and treat - for strengthening and gait stability.

## 2022-07-21 NOTE — Assessment & Plan Note (Signed)
Continue aciphex.  Followed by GI.

## 2022-07-22 ENCOUNTER — Telehealth: Payer: Self-pay | Admitting: Internal Medicine

## 2022-07-22 ENCOUNTER — Other Ambulatory Visit: Payer: Self-pay

## 2022-07-22 DIAGNOSIS — E875 Hyperkalemia: Secondary | ICD-10-CM

## 2022-07-22 NOTE — Telephone Encounter (Signed)
DME for neb faxed to [facility]

## 2022-07-22 NOTE — Telephone Encounter (Signed)
error

## 2022-07-23 ENCOUNTER — Telehealth: Payer: Self-pay | Admitting: Internal Medicine

## 2022-07-23 NOTE — Telephone Encounter (Signed)
See 07/21/22 phone message.  States DME faxed.  Also need to know if home health nursing can be added to PT/OT.  Also need to know how she is doing.

## 2022-07-24 NOTE — Telephone Encounter (Signed)
S/w pt - stated doing much better. Saw Home health yesterday - nurse stated lungs sounded clear.  Referral for PT/OT placed at pt visit - per Lelon Frohlich the nurse who came yesterday stated they will be starting it next week.

## 2022-07-29 ENCOUNTER — Telehealth: Payer: Self-pay | Admitting: Internal Medicine

## 2022-07-29 ENCOUNTER — Other Ambulatory Visit: Payer: Self-pay

## 2022-07-29 DIAGNOSIS — I360 Nonrheumatic tricuspid (valve) stenosis: Secondary | ICD-10-CM | POA: Diagnosis not present

## 2022-07-29 DIAGNOSIS — E7800 Pure hypercholesterolemia, unspecified: Secondary | ICD-10-CM | POA: Diagnosis not present

## 2022-07-29 DIAGNOSIS — S76312A Strain of muscle, fascia and tendon of the posterior muscle group at thigh level, left thigh, initial encounter: Secondary | ICD-10-CM | POA: Diagnosis not present

## 2022-07-29 DIAGNOSIS — M5416 Radiculopathy, lumbar region: Secondary | ICD-10-CM | POA: Diagnosis not present

## 2022-07-29 DIAGNOSIS — J189 Pneumonia, unspecified organism: Secondary | ICD-10-CM | POA: Diagnosis not present

## 2022-07-29 DIAGNOSIS — Q675 Congenital deformity of spine: Secondary | ICD-10-CM | POA: Diagnosis not present

## 2022-07-29 DIAGNOSIS — E875 Hyperkalemia: Secondary | ICD-10-CM | POA: Diagnosis not present

## 2022-07-29 DIAGNOSIS — M19011 Primary osteoarthritis, right shoulder: Secondary | ICD-10-CM | POA: Diagnosis not present

## 2022-07-29 DIAGNOSIS — M519 Unspecified thoracic, thoracolumbar and lumbosacral intervertebral disc disorder: Secondary | ICD-10-CM | POA: Diagnosis not present

## 2022-07-29 DIAGNOSIS — I5032 Chronic diastolic (congestive) heart failure: Secondary | ICD-10-CM | POA: Diagnosis not present

## 2022-07-29 DIAGNOSIS — I340 Nonrheumatic mitral (valve) insufficiency: Secondary | ICD-10-CM | POA: Diagnosis not present

## 2022-07-29 DIAGNOSIS — K5903 Drug induced constipation: Secondary | ICD-10-CM | POA: Diagnosis not present

## 2022-07-29 DIAGNOSIS — Z5181 Encounter for therapeutic drug level monitoring: Secondary | ICD-10-CM | POA: Diagnosis not present

## 2022-07-29 DIAGNOSIS — Z79899 Other long term (current) drug therapy: Secondary | ICD-10-CM | POA: Diagnosis not present

## 2022-07-29 DIAGNOSIS — I10 Essential (primary) hypertension: Secondary | ICD-10-CM | POA: Diagnosis not present

## 2022-07-29 DIAGNOSIS — I350 Nonrheumatic aortic (valve) stenosis: Secondary | ICD-10-CM | POA: Diagnosis not present

## 2022-07-29 DIAGNOSIS — M48061 Spinal stenosis, lumbar region without neurogenic claudication: Secondary | ICD-10-CM | POA: Diagnosis not present

## 2022-07-29 DIAGNOSIS — M19012 Primary osteoarthritis, left shoulder: Secondary | ICD-10-CM | POA: Diagnosis not present

## 2022-07-29 MED ORDER — FLUTICASONE PROPIONATE 50 MCG/ACT NA SUSP: 2.0000 | Freq: Every day | NASAL | 6 refills | Status: DC

## 2022-07-29 NOTE — Telephone Encounter (Signed)
SENT

## 2022-07-29 NOTE — Telephone Encounter (Signed)
[facility]

## 2022-07-30 ENCOUNTER — Ambulatory Visit: Payer: Medicare HMO | Admitting: Dermatology

## 2022-07-30 VITALS — BP 144/64

## 2022-07-30 DIAGNOSIS — B079 Viral wart, unspecified: Secondary | ICD-10-CM | POA: Diagnosis not present

## 2022-07-30 DIAGNOSIS — L820 Inflamed seborrheic keratosis: Secondary | ICD-10-CM

## 2022-07-30 DIAGNOSIS — L219 Seborrheic dermatitis, unspecified: Secondary | ICD-10-CM | POA: Diagnosis not present

## 2022-07-30 DIAGNOSIS — L720 Epidermal cyst: Secondary | ICD-10-CM

## 2022-07-30 MED ORDER — FLUOCINOLONE ACETONIDE 0.01 % OT OIL: TOPICAL_OIL | OTIC | 3 refills | Status: DC

## 2022-07-30 MED ORDER — FLUOCINONIDE 0.05 % EX SOLN: Freq: Two times a day (BID) | CUTANEOUS | 3 refills | Status: DC

## 2022-07-30 MED ORDER — KETOCONAZOLE 2 % EX SHAM: 1.0000 | MEDICATED_SHAMPOO | CUTANEOUS | 3 refills | Status: DC

## 2022-07-30 NOTE — Progress Notes (Signed)
Follow-Up Visit   Subjective  Kimberly Roy is a 88 y.o. female who presents for the following: Follow-up.  Patient presents today to have a spot on her back checked again. Area is bothersome when sitting. She also has something on her right thumb and left forearm to check, gets irritated.   Patient accompanied by caregiver and son.   The following portions of the chart were reviewed this encounter and updated as appropriate:       Review of Systems:  No other skin or systemic complaints except as noted in HPI or Assessment and Plan.  Objective  Well appearing patient in no apparent distress; mood and affect are within normal limits.  A focused examination was performed including face, arms, scalp, back. Relevant physical exam findings are noted in the Assessment and Plan.  scalp, ears Scaly erythema of the right ear canal and scalp  Left forearm Erythematous stuck-on, waxy papule or plaque  R thumb 3.0 mm Verrucous papules -- Discussed viral etiology and contagion.   spinal upper back 2.0 cm firm white sub q nodule with punctum    Assessment & Plan  Seborrheic dermatitis scalp, ears  Chronic and persistent condition with duration or expected duration over one year. Condition is symptomatic/ bothersome to patient. Not currently at goal.   Seborrheic Dermatitis  -  is a chronic persistent rash characterized by pinkness and scaling most commonly of the mid face but also can occur on the scalp (dandruff), ears; mid chest, mid back and groin.  It tends to be exacerbated by stress and cooler weather.  People who have neurologic disease may experience new onset or exacerbation of existing seborrheic dermatitis.  The condition is not curable but treatable and can be controlled.  Restart fluocinolone oil Apply 1-2 gtts to ears QD/BID prn flares  Continue fluocinonide solution QD/BID AA scalp prn itch Continue ketoconazole 2% shampoo massage into scalp 3x/wk and leave in for 10  mins before rinsing out  ketoconazole (NIZORAL) 2 % shampoo - scalp, ears Apply 1 Application topically See admin instructions. apply three times per week, massage into scalp and leave in for 10 minutes before rinsing out  fluocinonide (LIDEX) 0.05 % external solution - scalp, ears Apply topically 2 (two) times daily. Apply drops to scalp for itch  Fluocinolone Acetonide 0.01 % OIL - scalp, ears Apply 1-2 drops into ears once to twice daily as needed.  Inflamed seborrheic keratosis Left forearm  Symptomatic, irritating, patient would like treated.  Destruction of lesion - Left forearm  Destruction method: cryotherapy   Informed consent: discussed and consent obtained   Lesion destroyed using liquid nitrogen: Yes   Region frozen until ice ball extended beyond lesion: Yes   Outcome: patient tolerated procedure well with no complications   Post-procedure details: wound care instructions given   Additional details:  Prior to procedure, discussed risks of blister formation, small wound, skin dyspigmentation, or rare scar following cryotherapy. Recommend Vaseline ointment to treated areas while healing.   Viral warts, unspecified type R thumb  Residual  Viral Wart (HPV) Counseling  Discussed viral / HPV (Human Papilloma Virus) etiology and risk of spread /infectivity to other areas of body as well as to other people.  Multiple treatments and methods may be required to clear warts and it is possible treatment may not be successful.  Treatment risks include discoloration; scarring and there is still potential for wart recurrence.  Destruction of lesion - R thumb  Destruction method: cryotherapy  Informed consent: discussed and consent obtained   Lesion destroyed using liquid nitrogen: Yes   Region frozen until ice ball extended beyond lesion: Yes   Outcome: patient tolerated procedure well with no complications   Post-procedure details: wound care instructions given   Additional  details:  Prior to procedure, discussed risks of blister formation, small wound, skin dyspigmentation, or rare scar following cryotherapy. Recommend Vaseline ointment to treated areas while healing.   Epidermal inclusion cyst spinal upper back  Cyst with symptoms and/or recent change.  Discussed surgical excision to remove, including resulting scar and possible recurrence.  Patient will schedule for surgery. Pre-op information given.    Return surgery cyst of the back.  IJamesetta Orleans, CMA, am acting as scribe for Brendolyn Patty, MD .  Documentation: I have reviewed the above documentation for accuracy and completeness, and I agree with the above.  Brendolyn Patty MD

## 2022-07-30 NOTE — Patient Instructions (Addendum)
Restart fluocinolone oil Apply 1-2 drops to ears once to twice daily as needed for flares.  Continue fluocinonide solution once to twice daily to affected areas scalp as needed for itch. Continue ketoconazole 2% shampoo massage into scalp 3x/wk and leave in for 10 mins before rinsing.  Cryotherapy Aftercare  Wash gently with soap and water everyday.   Apply Vaseline and Band-Aid daily until healed.      Pre-Operative Instructions  You are scheduled for a surgical procedure at [facility]. We recommend you read the following instructions. If you have any questions or concerns, please call the office at [phone].  Shower and wash the entire body with soap and water the day of your surgery paying special attention to cleansing at and around the planned surgery site.  Avoid aspirin or aspirin containing products at least fourteen (14) days prior to your surgical procedure and for at least one week (7 Days) after your surgical procedure. If you take aspirin on a regular basis for heart disease or history of stroke or for any other reason, we may recommend you continue taking aspirin but please notify us if you take this on a regular basis. Aspirin can cause more bleeding to occur during surgery as well as prolonged bleeding and bruising after surgery.   Avoid other nonsteroidal pain medications at least one week prior to surgery and at least one week prior to your surgery. These include medications such as Ibuprofen (Motrin, Advil and Nuprin), Naprosyn, Voltaren, Relafen, etc. If medications are used for therapeutic reasons, please inform us as they can cause increased bleeding or prolonged bleeding during and bruising after surgical procedures.   Please advise [place] if you are taking any "blood thinner" medications such as Coumadin or Dipyridamole or Plavix or similar medications. These cause increased bleeding and prolonged bleeding during procedures and bruising after surgical  procedures. We may have to consider discontinuing these medications briefly prior to and shortly after your surgery if safe to do so.   Please inform us of all medications you are currently taking. All medications that are taken regularly should be taken the day of surgery as you always do. Nevertheless, we need to be informed of what medications you are taking prior to surgery to know whether they will affect the procedure or cause any complications.   Please inform us of any medication allergies. Also inform us of whether you have allergies to Latex or rubber products or whether you have had any adverse reaction to Lidocaine or Epinephrine.  Please inform us of any prosthetic or artificial body parts such as artificial heart valve, joint replacements, etc., or similar condition that might require preoperative antibiotics.   We recommend avoidance of alcohol at least two weeks prior to surgery and continued avoidance for at least two weeks after surgery.   We recommend discontinuation of tobacco smoking at least two weeks prior to surgery and continued abstinence for at least two weeks after surgery.  Do not plan strenuous exercise, strenuous work or strenuous lifting for approximately four weeks after your surgery.   We request if you are unable to make your scheduled surgical appointment, please call us at least a week in advance or as soon as you are aware of a problem so that we can cancel or reschedule the appointment.   You MAY TAKE TYLENOL (acetaminophen) for pain as it is not a blood thinner.   PLEASE PLAN TO BE IN TOWN FOR TWO WEEKS FOLLOWING SURGERY, THIS IS IMPORTANT  SO YOU CAN BE CHECKED FOR DRESSING CHANGES, SUTURE REMOVAL AND TO MONITOR FOR POSSIBLE COMPLICATIONS.    Due to recent changes in healthcare laws, you may see results of your pathology and/or laboratory studies on MyChart before the doctors have had a chance to review them. We understand that in some cases there may be  results that are confusing or concerning to you. Please understand that not all results are received at the same time and often the doctors may need to interpret multiple results in order to provide you with the best plan of care or course of treatment. Therefore, we ask that you please give [place] 2 business days to thoroughly review all your results before contacting the office for clarification. Should we see a critical lab result, you will be contacted sooner.   If You Need Anything After Your Visit  If you have any questions or concerns for your doctor, please call our main line at [phone] and press option 4 to reach your doctor's medical assistant. If no one answers, please leave a voicemail as directed and we will return your call as soon as possible. Messages left after 4 pm will be answered the following business day.   You may also send [place] a message via [place]. We typically respond to MyChart messages within 1-2 business days.  For prescription refills, please ask your pharmacy to contact our office. Our fax number is [phone].  If you have an urgent issue when the clinic is closed that cannot wait until the next business day, you can page your doctor at the number below.    Please note that while we do our best to be available for urgent issues outside of office hours, we are not available 24/7.   If you have an urgent issue and are unable to reach [place], you may choose to seek medical care at your doctor's office, retail clinic, urgent care center, or emergency room.  If you have a medical emergency, please immediately call 911 or go to the emergency department.  Pager Numbers  - Dr. Nehemiah Massed: [phone]  - Dr. Laurence Ferrari: [phone]  - Dr. Nicole Kindred: [phone]  In the event of inclement weather, please call our main line at [phone] for an update on the status of any delays or closures.  Dermatology Medication Tips: Please keep the boxes that topical medications come  in in order to help keep track of the instructions about where and how to use these. Pharmacies typically print the medication instructions only on the boxes and not directly on the medication tubes.   If your medication is too expensive, please contact our office at [phone] option 4 or send [place] a message through [place].   We are unable to tell what your co-pay for medications will be in advance as this is different depending on your insurance coverage. However, we may be able to find a substitute medication at lower cost or fill out paperwork to get insurance to cover a needed medication.   If a prior authorization is required to get your medication covered by your insurance company, please allow [place] 1-2 business days to complete this process.  Drug prices often vary depending on where the prescription is filled and some pharmacies may offer cheaper prices.  The website www.goodrx.com contains coupons for medications through different pharmacies. The prices here do not account for what the cost may be with help from insurance (it may be cheaper with your insurance), but the website can give you the  price if you did not use any insurance.  - You can print the associated coupon and take it with your prescription to the pharmacy.  - You may also stop by our office during regular business hours and pick up a GoodRx coupon card.  - If you need your prescription sent electronically to a different pharmacy, notify our office through [facility] or by phone at [phone] option 4.     Si Usted Necesita Algo Despus de Su Visita  Tambin puede enviarnos un mensaje a travs de Pharmacist, community. Por lo general respondemos a los mensajes de MyChart en el transcurso de 1 a 2 das hbiles.  Para renovar recetas, por favor pida a su farmacia que se ponga en contacto con nuestra oficina. Harland Dingwall de fax es [place] [phone].  Si tiene un asunto urgente cuando la clnica est cerrada y que no puede  esperar hasta el siguiente da hbil, puede llamar/localizar a su doctor(a) al nmero que aparece a continuacin.   Por favor, tenga en cuenta que aunque hacemos todo lo posible para estar disponibles para asuntos urgentes fuera del horario de [place], no estamos disponibles las 24 horas del da, los 7 das de la [place].   Si tiene un problema urgente y no puede comunicarse con nosotros, puede optar por buscar atencin mdica  en el consultorio de su doctor(a), en una clnica privada, en un centro de atencin urgente o en una sala de emergencias.  Si tiene Engineering geologist, por favor llame inmediatamente al 911 o vaya a la sala de emergencias.  Nmeros de bper  - Dr. Nehemiah Massed: [phone]  - Dra. Moye: [phone]  - Dra. Nicole Kindred: [phone]  En caso de inclemencias del [place], por favor llame a Johnsie Kindred principal al [phone] para una actualizacin sobre el [place] de cualquier retraso o cierre.  Consejos para la medicacin en dermatologa: Por favor, guarde las cajas en las que vienen los medicamentos de uso tpico para ayudarle a seguir las instrucciones sobre dnde y cmo usarlos. Las farmacias generalmente imprimen las instrucciones del medicamento slo en las cajas y no directamente en los tubos del [place].   Si su medicamento es muy caro, por favor, pngase en contacto con Zigmund Daniel llamando al [phone] y presione la opcin 4 o envenos un mensaje a travs de Pharmacist, community.   No podemos decirle cul ser su copago por los medicamentos por adelantado ya que esto es diferente dependiendo de la cobertura de su seguro. Sin embargo, es posible que podamos encontrar un medicamento sustituto a Electrical engineer un formulario para que el seguro cubra el medicamento que se considera necesario.   Si se requiere una autorizacin previa para que su compaa de seguros [place] su medicamento, por favor permtanos de 1 a 2 das hbiles para completar este proceso.  Los  precios de los medicamentos varan con frecuencia dependiendo del Environmental consultant de dnde se surte la receta y alguna farmacias pueden ofrecer precios ms baratos.  El sitio web www.goodrx.com tiene cupones para medicamentos de Airline pilot. Los precios aqu no tienen en cuenta lo que podra costar con la ayuda del seguro (puede ser ms barato con su seguro), pero el sitio web puede darle el precio si no utiliz Research scientist (physical sciences).  - Puede imprimir el cupn correspondiente y llevarlo con su receta a la farmacia.  - Tambin puede pasar por nuestra oficina durante el horario de atencin regular y Charity fundraiser una tarjeta de cupones de GoodRx.  - Si necesita que  su receta se enve electrnicamente a una farmacia diferente, informe a nuestra oficina a travs de MyChart de  o por telfono llamando al [phone] y presione la opcin 4.

## 2022-08-01 ENCOUNTER — Telehealth: Payer: Self-pay

## 2022-08-01 NOTE — Telephone Encounter (Signed)
S/w pt - stated just feeling very tired. No chest pain, sob, dizziness, etc.

## 2022-08-01 NOTE — Telephone Encounter (Signed)
Colletta Maryland called from [facility] to state that patient refused OT evaluation this week and would like to move her appointment to next week.  Patient states she feels too weak to do it this week.

## 2022-08-01 NOTE — Telephone Encounter (Signed)
Noted. Please confirm she is doing ok

## 2022-08-07 ENCOUNTER — Ambulatory Visit (INDEPENDENT_AMBULATORY_CARE_PROVIDER_SITE_OTHER): Payer: Medicare HMO | Admitting: Internal Medicine

## 2022-08-07 ENCOUNTER — Encounter: Payer: Self-pay | Admitting: Internal Medicine

## 2022-08-07 VITALS — BP 140/68 | HR 70 | Temp 98.1°F | Resp 15 | Ht 62.0 in | Wt 173.7 lb

## 2022-08-07 DIAGNOSIS — G8929 Other chronic pain: Secondary | ICD-10-CM

## 2022-08-07 DIAGNOSIS — I350 Nonrheumatic aortic (valve) stenosis: Secondary | ICD-10-CM

## 2022-08-07 DIAGNOSIS — I872 Venous insufficiency (chronic) (peripheral): Secondary | ICD-10-CM | POA: Diagnosis not present

## 2022-08-07 DIAGNOSIS — J189 Pneumonia, unspecified organism: Secondary | ICD-10-CM

## 2022-08-07 DIAGNOSIS — E538 Deficiency of other specified B group vitamins: Secondary | ICD-10-CM | POA: Diagnosis not present

## 2022-08-07 DIAGNOSIS — G629 Polyneuropathy, unspecified: Secondary | ICD-10-CM

## 2022-08-07 DIAGNOSIS — D649 Anemia, unspecified: Secondary | ICD-10-CM | POA: Diagnosis not present

## 2022-08-07 DIAGNOSIS — M5440 Lumbago with sciatica, unspecified side: Secondary | ICD-10-CM

## 2022-08-07 DIAGNOSIS — I700 Atherosclerosis of aorta: Secondary | ICD-10-CM

## 2022-08-07 DIAGNOSIS — I509 Heart failure, unspecified: Secondary | ICD-10-CM | POA: Diagnosis not present

## 2022-08-07 DIAGNOSIS — I10 Essential (primary) hypertension: Secondary | ICD-10-CM | POA: Diagnosis not present

## 2022-08-07 DIAGNOSIS — E785 Hyperlipidemia, unspecified: Secondary | ICD-10-CM

## 2022-08-07 DIAGNOSIS — J9611 Chronic respiratory failure with hypoxia: Secondary | ICD-10-CM

## 2022-08-07 DIAGNOSIS — F439 Reaction to severe stress, unspecified: Secondary | ICD-10-CM

## 2022-08-07 DIAGNOSIS — M19011 Primary osteoarthritis, right shoulder: Secondary | ICD-10-CM

## 2022-08-07 DIAGNOSIS — K219 Gastro-esophageal reflux disease without esophagitis: Secondary | ICD-10-CM

## 2022-08-07 DIAGNOSIS — E033 Postinfectious hypothyroidism: Secondary | ICD-10-CM

## 2022-08-07 DIAGNOSIS — I272 Pulmonary hypertension, unspecified: Secondary | ICD-10-CM

## 2022-08-07 NOTE — Progress Notes (Signed)
Patient ID: Kimberly Roy, female   DOB: February 03, 1931, 88 y.o.   MRN: [id number]   Subjective:    Patient ID: Kimberly Roy, female    DOB: 1931-05-29, 88 y.o.   MRN: [id number]   Patient here for a scheduled follow up.  Marland Kitchen   HPI Here to follow up regarding her blood pressure, cholesterol, previous cough and congestion and weakness.  Saw cardiology 07/29/22 - feeling better. ECHO 07/14/22 - normal ER 60-65% with mild mitral regurgitation, mild to moderate tricuspid stenosis and mild aortic stenosis.  Had f/u met b and cxr through cardiology.  [name] in today accompanied by caretaker.  History obtained from both of them.  Doing better.  Energy is improving.  Working with therapy.  Using her walker.  Was questioning need for oxygen when up and moving.  Currently has nocturnal oxygen.  Eating.  No nausea or vomiting.  Feels her breathing is stable.  No abdominal pain.  Bowels stable.  Shoulder pain - cortisone injection. Ortho - nerve block.     Past Medical History:  Diagnosis Date   Anemia    Anxiety    Chest pain    CHF (congestive heart failure) (HCC)    Constipation    DDD (degenerative disc disease), cervical    Depression    DVT (deep venous thrombosis) (HCC)    Dysphonia    Dyspnea    Fatty liver    Fatty liver    Headache    Hyperlipidemia    Hyperpiesia    Hypertension    Hypothyroidism    Interstitial cystitis    Left ventricular dysfunction    Lymphedema    Nephrolithiasis    Obstructive sleep apnea    Osteoarthritis    knees/cervical and lumbar spine   Pulmonary hypertension (HCC)    Pulmonary nodules    followed by Dr Raul Del   Pure hypercholesterolemia    Renal cyst    right   Past Surgical History:  Procedure Laterality Date   ABDOMINAL HYSTERECTOMY     ovaries left in place   APPENDECTOMY     Back Surgeries     BACK SURGERY     BREAST REDUCTION SURGERY     3/99   CARDIAC CATHETERIZATION     cataracts Bilateral    CERVICAL SPINE SURGERY     ESOPHAGEAL  MANOMETRY N/A 08/02/2015   Procedure: ESOPHAGEAL MANOMETRY (EM);  Surgeon: Josefine Class, MD;  Location: [facility] ENDOSCOPY;  Service: Endoscopy;  Laterality: N/A;   ESOPHAGOGASTRODUODENOSCOPY N/A 02/27/2015   Procedure: ESOPHAGOGASTRODUODENOSCOPY (EGD);  Surgeon: Hulen Luster, MD;  Location: [facility] ENDOSCOPY;  Service: Gastroenterology;  Laterality: N/A;   ESOPHAGOGASTRODUODENOSCOPY (EGD) WITH PROPOFOL N/A 10/30/2018   Procedure: ESOPHAGOGASTRODUODENOSCOPY (EGD) WITH PROPOFOL;  Surgeon: Lollie Sails, MD;  Location: [facility] ENDOSCOPY;  Service: Endoscopy;  Laterality: N/A;   EXCISIONAL HEMORRHOIDECTOMY     EYE SURGERY     FRACTURE SURGERY     HEMORRHOID SURGERY     HIP SURGERY  2013   Right hip surgery   JOINT REPLACEMENT     KNEE ARTHROSCOPY     left and right   ORIF FEMUR FRACTURE Right 08/07/2018   Procedure: OPEN REDUCTION INTERNAL FIXATION (ORIF) DISTAL FEMUR FRACTURE;  Surgeon: Hessie Knows, MD;  Location: [facility] ORS;  Service: Orthopedics;  Laterality: Right;   REDUCTION MAMMAPLASTY Bilateral YRS AGO   REPLACEMENT TOTAL KNEE Bilateral    rotator cuff surgery     blilateral   TONSILECTOMY/ADENOIDECTOMY WITH  MYRINGOTOMY     VISCERAL ARTERY INTERVENTION N/A 02/25/2019   Procedure: VISCERAL ARTERY INTERVENTION;  Surgeon: Algernon Huxley, MD;  Location: [facility];  Service: Cardiovascular;  Laterality: N/A;   Family History  Problem Relation Age of Onset   Heart disease Mother    Stroke Mother    Hypertension Mother    Heart disease Father        myocardial infarction age 85   Breast cancer Neg Hx    Social History   Socioeconomic History   Marital status: Widowed    Spouse name: Not on file   Number of children: 2   Years of education: 35   Highest education level: 12th grade  Occupational History    Comment: insurance agency  Tobacco Use   Smoking status: Never   Smokeless tobacco: Never  Vaping Use   Vaping Use: Never used  Substance and Sexual Activity    Alcohol use: No    Alcohol/week: 0.0 standard drinks of alcohol   Drug use: Never   Sexual activity: Not Currently  Other Topics Concern   Not on file  Social History Narrative   Not on file   Social Determinants of Health   Financial Resource Strain: Low Risk  (03/09/2021)   Overall Financial Resource Strain (CARDIA)    Difficulty of Paying Living Expenses: Not hard at all  Food Insecurity: No Food Insecurity (07/12/2022)   Hunger Vital Sign    Worried About Running Out of Food in the Last Year: Never true    [place] in the Last Year: Never true  Transportation Needs: Unmet Transportation Needs (07/12/2022)   PRAPARE - Transportation    Lack of Transportation (Medical): Yes    Lack of Transportation (Non-Medical): Yes  Physical Activity: Inactive (03/09/2021)   Exercise Vital Sign    Days of Exercise per Week: 0 days    Minutes of Exercise per Session: 0 min  Stress: No Stress Concern Present (03/09/2021)   [place]    Feeling of Stress : Only a little  Social Connections: Socially Isolated (11/15/2021)   Social Connection and Isolation Panel [NHANES]    Frequency of Communication with Friends and Family: Three times a week    Frequency of Social Gatherings with Friends and Family: Twice a week    Attends Religious Services: Never    Marine scientist or Organizations: No    Attends Archivist Meetings: Never    Marital Status: Widowed     Review of Systems  Constitutional:  Negative for appetite change and fever.       Energy improving.   HENT:  Negative for congestion and sinus pressure.   Respiratory:  Negative for cough and chest tightness.        Breathing stable.   Cardiovascular:  Negative for chest pain and palpitations.       Lower extremity swelling -stable. No increase.   Gastrointestinal:  Negative for abdominal pain, diarrhea, nausea and vomiting.  Genitourinary:   Negative for difficulty urinating and dysuria.  Musculoskeletal:  Negative for myalgias.       Right shoulder pain.    Skin:  Negative for color change and rash.  Neurological:  Negative for dizziness and headaches.  Psychiatric/Behavioral:  Negative for agitation and dysphoric mood.        Objective:     BP (!) 140/68 (BP Location: Left Arm, Patient Position: Sitting, Cuff  Size: Large)   Pulse 70   Temp 98.1 F (36.7 C) (Temporal)   Resp 15   Ht _0  (1.575 m)   Wt 173 lb 11.2 oz (78.8 kg)   SpO2 93%   BMI 31.77 kg/m  Wt Readings from Last 3 Encounters:  08/07/22 173 lb 11.2 oz (78.8 kg)  07/19/22 170 lb (77.1 kg)  07/11/22 174 lb (78.9 kg)    Physical Exam Vitals reviewed.  Constitutional:      General: She is not in acute distress.    Appearance: Normal appearance.  HENT:     Head: Normocephalic and atraumatic.     Right Ear: External ear normal.     Left Ear: External ear normal.  Eyes:     General: No scleral icterus.       Right eye: No discharge.        Left eye: No discharge.     Conjunctiva/sclera: Conjunctivae normal.  Neck:     Thyroid: No thyromegaly.  Cardiovascular:     Rate and Rhythm: Normal rate and regular rhythm.  Pulmonary:     Effort: No respiratory distress.     Breath sounds: Normal breath sounds. No wheezing.  Abdominal:     General: Bowel sounds are normal.     Palpations: Abdomen is soft.     Tenderness: There is no abdominal tenderness.  Musculoskeletal:        General: No tenderness.     Cervical back: Neck supple. No tenderness.     Comments: No increased swelling.  Compression hose on.   Lymphadenopathy:     Cervical: No cervical adenopathy.  Skin:    Findings: No erythema or rash.  Neurological:     Mental Status: She is alert.  Psychiatric:        Mood and Affect: Mood normal.        Behavior: Behavior normal.      Outpatient Encounter Medications as of 08/07/2022  Medication Sig   albuterol (VENTOLIN HFA) 108 (90  Base) MCG/ACT inhaler INHALE 2 PUFFS INTO THE LUNGS EVERY 6 HOURS AS NEEDED FOR WHEEZING OR SHORTNESS OF BREATH   aluminum-magnesium hydroxide-simethicone (MAALOX) 200-200-20 MG/5ML SUSP Take 15 mLs by mouth 4 (four) times daily -  before meals and at bedtime.    amLODipine (NORVASC) 10 MG tablet TAKE 1 TABLET BY MOUTH DAILY   aspirin 81 MG EC tablet Take 81 mg by mouth daily. Every other day   atorvastatin (LIPITOR) 20 MG tablet TAKE 1 TABLET BY MOUTH AT BEDTIME   benazepril (LOTENSIN) 40 MG tablet TAKE 1 TABLET BY MOUTH DAILY   betamethasone dipropionate 0.05 % cream Apply 1 application topically 2 (two) times daily.   busPIRone (BUSPAR) 10 MG tablet Take 10 mg by mouth 3 (three) times daily.   calcium citrate-vitamin D (CITRACAL+D) 315-200 MG-UNIT tablet Take 2 tablets by mouth daily.   Cholecalciferol (VITAMIN D3) 1000 UNITS CAPS Take 1 capsule by mouth daily.   clobetasol cream (TEMOVATE) 7.93 % Apply 1 application topically See admin instructions. Apply to affected areas of body 1 - 2 times daily as needed for itchy bumps. Avoid applying to face, groin, and axilla. Use as directed.   cyanocobalamin (VITAMIN B12) 1000 MCG/ML injection Inject 1 mL (1,000 mcg total) into the muscle every 3 (three) months.   Diclofenac Sodium 3 % GEL Apply topically 2 (two) times daily.   Docusate Sodium (DSS) 100 MG CAPS Take 100 mg by mouth 2 (two) times daily.  DULoxetine (CYMBALTA) 60 MG capsule Take 60 mg by mouth daily.   Fluocinolone Acetonide 0.01 % OIL Apply 1-2 drops into ears once to twice daily as needed.   fluocinonide (LIDEX) 0.05 % external solution Apply topically 2 (two) times daily. Apply drops to scalp for itch   fluticasone (FLONASE) 50 MCG/ACT nasal spray Place 2 sprays into both nostrils daily.   furosemide (LASIX) 20 MG tablet TAKE 1 TABLET BY MOUTH DAILY AS NEEDED   gabapentin (NEURONTIN) 300 MG capsule Take two tablets tid (in am, midday and q hs).   HYDROcodone-acetaminophen (NORCO)  10-325 MG tablet Take 1 tablet by mouth 3 (three) times daily.   [employer]) OINT Apply topically as directed (as discussed).   ipratropium (ATROVENT) 0.03 % nasal spray Place 2 sprays into both nostrils every 12 (twelve) hours.   ketoconazole (NIZORAL) 2 % shampoo Apply 1 Application topically See admin instructions. apply three times per week, massage into scalp and leave in for 10 minutes before rinsing out   levalbuterol (XOPENEX) 0.63 MG/3ML nebulizer solution Take 3 mLs (0.63 mg total) by nebulization every 6 (six) hours as needed for wheezing or shortness of breath.   LINZESS 145 MCG CAPS capsule Take 145 mcg by mouth daily.   magnesium oxide (MAG-OX) 400 MG tablet Take 1 tablet (400 mg total) by mouth daily.   metoprolol succinate (TOPROL-XL) 50 MG 24 hr tablet TAKE 1 TABLET BY MOUTH DAILY. TAKE WITH OR IMMEDIATELY FOLLOWING A MEAL.   morphine (MS CONTIN) 30 MG 12 hr tablet Take 30 mg by mouth every 12 (twelve) hours.   morphine (MS CONTIN) 60 MG 12 hr tablet Take 1 tablet (60 mg total) by mouth 2 (two) times daily.   Naldemedine Tosylate 0.2 MG TABS Take 0.2 mg by mouth daily.   nystatin (MYCOSTATIN/NYSTOP) powder Apply 1 application. topically 2 (two) times daily as needed.   nystatin cream (MYCOSTATIN) Apply 1 Application topically 2 (two) times daily.   polyethylene glycol powder (GLYCOLAX/MIRALAX) 17 GM/SCOOP powder MIX 17 GRAMS AS MARKED ON BOTTLE TOP IN 8 OUNCES OF WATER AND DRINK ONCE A DAY AS DIRECTED.   RABEprazole (ACIPHEX) 20 MG tablet TAKE 1 TABLET BY MOUTH TWICE A DAY BEFORE A MEAL   senna (SENOKOT) 8.6 MG TABS tablet Take 1 tablet by mouth 2 (two) times daily.   Simethicone (GAS-X PO) Take 2 tablets by mouth 2 (two) times daily.   sodium chloride (OCEAN) 0.65 % nasal spray Place 1 spray into the nose as needed.   SYNTHROID 100 MCG tablet Take 1 tablet (100 mcg total) by mouth daily.   triamcinolone ointment (KENALOG) 0.1 % APPLY TWICE DAILY TO BITES AND  RASH UNTIL FLAT AND SMOOTH **DO NOT APPLY TO FACE**   No facility-administered encounter medications on file as of 08/07/2022.     Lab Results  Component Value Date   WBC 7.3 07/19/2022   HGB 10.0 (L) 07/19/2022   HCT 30.6 (L) 07/19/2022   PLT 232.0 07/19/2022   GLUCOSE 92 07/19/2022   CHOL 143 01/21/2022   TRIG 108.0 01/21/2022   HDL 57.90 01/21/2022   LDLCALC 63 01/21/2022   ALT 18 07/11/2022   AST 25 07/11/2022   NA 140 07/19/2022   K 5.2 No hemolysis seen (H) 07/19/2022   CL 100 07/19/2022   CREATININE 0.95 07/19/2022   BUN 21 07/19/2022   CO2 33 (H) 07/19/2022   TSH 4.63 01/21/2022   INR 0.89 08/06/2018   HGBA1C 6.0 10/19/2014  ECHOCARDIOGRAM COMPLETE  Result Date: 07/14/2022    ECHOCARDIOGRAM REPORT   Patient Name:   [facility] Date of Exam: 07/14/2022 Medical Rec #:  [id number]   Height:       62.0 in Accession #:    [id number]  Weight:       174.0 lb Date of Birth:  1931-04-24    BSA:          1.802 m Patient Age:    61 years    BP:           126/45 mmHg Patient Gender: F           HR:           70 bpm. Exam Location:  [facility] Procedure: 2D Echo Indications:     Cardiomyopathy I42.9  History:         Patient has no prior history of Echocardiogram examinations.  Sonographer:     Kathlen Brunswick RDCS Referring Phys:  [id number] Jeanella Flattery AMIN Diagnosing Phys: Isaias Cowman MD  Sonographer Comments: Technically difficult study due to poor echo windows and suboptimal apical window. Image acquisition challenging due to respiratory motion. IMPRESSIONS  1. Left ventricular ejection fraction, by estimation, is 60 to 65%. The left ventricle has normal function. The left ventricle has no regional wall motion abnormalities. There is mild left ventricular hypertrophy. Left ventricular diastolic parameters are consistent with Grade I diastolic dysfunction (impaired relaxation).  2. Right ventricular systolic function is normal. The right ventricular size is normal.  3. The mitral valve  is normal in structure. Mild mitral valve regurgitation. No evidence of mitral stenosis.  4. Mild to moderate tricuspid stenosis.  5. The aortic valve is normal in structure. Aortic valve regurgitation is mild. Mild aortic valve stenosis.  6. The inferior vena cava is normal in size with greater than 50% respiratory variability, suggesting right atrial pressure of 3 mmHg. FINDINGS  Left Ventricle: Left ventricular ejection fraction, by estimation, is 60 to 65%. The left ventricle has normal function. The left ventricle has no regional wall motion abnormalities. The left ventricular internal cavity size was normal in size. There is  mild left ventricular hypertrophy. Left ventricular diastolic parameters are consistent with Grade I diastolic dysfunction (impaired relaxation). Right Ventricle: The right ventricular size is normal. No increase in right ventricular wall thickness. Right ventricular systolic function is normal. Left Atrium: Left atrial size was normal in size. Right Atrium: Right atrial size was normal in size. Pericardium: There is no evidence of pericardial effusion. Mitral Valve: The mitral valve is normal in structure. Mild mitral valve regurgitation. No evidence of mitral valve stenosis. Tricuspid Valve: The tricuspid valve is normal in structure. Tricuspid valve regurgitation is mild . Mild to moderate tricuspid stenosis. Aortic Valve: The aortic valve is normal in structure. Aortic valve regurgitation is mild. Aortic regurgitation PHT measures 606 msec. Mild aortic stenosis is present. Aortic valve mean gradient measures 11.0 mmHg. Aortic valve peak gradient measures 24.8 mmHg. Aortic valve area, by VTI measures 1.24 cm. Pulmonic Valve: The pulmonic valve was normal in structure. Pulmonic valve regurgitation is not visualized. No evidence of pulmonic stenosis. Aorta: The aortic root is normal in size and structure. Venous: The inferior vena cava is normal in size with greater than 50% respiratory  variability, suggesting right atrial pressure of 3 mmHg. IAS/Shunts: No atrial level shunt detected by color flow Doppler.  LEFT VENTRICLE PLAX 2D LVIDd:         4.60 cm  Diastology LVIDs:         3.00 cm   LV e' medial:    6.31 cm/s LV PW:         1.40 cm   LV E/e' medial:  11.5 LV IVS:        1.20 cm   LV e' lateral:   7.94 cm/s LVOT diam:     1.80 cm   LV E/e' lateral: 9.2 LV SV:         70 LV SV Index:   39 LVOT Area:     2.54 cm  LEFT ATRIUM             Index LA diam:        4.70 cm 2.61 cm/m LA Vol (A2C):   30.2 ml 16.76 ml/m LA Vol (A4C):   19.4 ml 10.77 ml/m LA Biplane Vol: 25.5 ml 14.15 ml/m  AORTIC VALVE                     PULMONIC VALVE AV Area (Vmax):    1.10 cm      PV Vmax:          1.27 m/s AV Area (Vmean):   1.17 cm      PV Peak grad:     6.5 mmHg AV Area (VTI):     1.24 cm      PR End Diast Vel: 5.48 msec AV Vmax:           249.00 cm/s AV Vmean:          145.000 cm/s AV VTI:            0.563 m AV Peak Grad:      24.8 mmHg AV Mean Grad:      11.0 mmHg LVOT Vmax:         108.00 cm/s LVOT Vmean:        66.500 cm/s LVOT VTI:          0.274 m LVOT/AV VTI ratio: 0.49 AI PHT:            606 msec  AORTA Ao Root diam: 3.10 cm Ao Asc diam:  3.10 cm MITRAL VALVE               TRICUSPID VALVE MV Area (PHT): 2.21 cm    TR Peak grad:   41.2 mmHg MV Decel Time: 343 msec    TR Vmax:        321.00 cm/s MV E velocity: 72.80 cm/s MV A velocity: 95.40 cm/s  SHUNTS MV E/A ratio:  0.76        Systemic VTI:  0.27 m                            Systemic Diam: 1.80 cm Isaias Cowman MD Electronically signed by Isaias Cowman MD Signature Date/Time: 07/14/2022/12:34:06 PM    Final        Assessment & Plan:   Problem List Items Addressed This Visit     Anemia    Saw Dr Rogue Bussing 04/12/22 - previous rash to IV venofer.  Oral iron daily.  B12 injections.  Follow cbc.       Aortic atherosclerosis (HCC)    Continue lipitor.       Aortic stenosis    Just had echo in hospital.  Mild.  Followed by  Dr Saralyn Pilar.       B12 deficiency  B12 injections (per hematology).  Given 06/04/22 - recommended q 3 months.       Back pain    Chronic issue.  Limits activity.  Seeing ortho.  Home health PT.       CHF (congestive heart failure) (HCC)    Elevated BNP - ECHO 60% with G1DD.  Diagnosed with acute on chronic diastolic CHF.  Was diuresed.  Symptoms improved. Takes lasix daily.  Weight is down.  No evidence of volume overload on exam.  Continue daily lasix.  Follow metabolic panel.       Chronic respiratory failure with hypoxia (HCC)    Uses oxygen at night.  Continue oxygen - nocturnal.  Did not qualify - 5 minute walk in office.        Chronic venous insufficiency    Continue compression hose.  Swelling overall improved.       GERD (gastroesophageal reflux disease)    Continue aciphex.  Followed by GI.       Hyperlipidemia    On lipitor.  Low cholesterol diet and exercise.  Follow lipid panel and liver function tests.        Hypertension - Primary    Blood pressure as outlined.  Continue benazepril, amlodipine, lasix and metoprolol.  Follow pressures.  Follow metabolic panel.       Hypothyroidism    On thyroid replacement.  Follow tsh.       Neuropathy    Continues on gabapentin.  Follow.       Pneumonia    Admitted 07/11/22 - 07/15/22 - after presenting with increased sob and cough. PTAdimiission, she was evaluated and CT chest that showed a multifocal pneumonia on the right side but other work-up was unremarkable with normal WBC and negative procalcitonin.  Treated with doxycycline, prednisone and augmentin.  Continued to have increased cough and sob - back to ER and admitted.  Respiratory panel - positive for rhinovirus.  Abx stopped. Given Nebs and Flutter valve.  Oxygen weaned. Discharged home with nocturnal oxygen and plans for PT/OT.  Doing better.  Weakness is improving.  Working with PT.  F/u cxr was done through cardiology.  Need results.        Primary  osteoarthritis of right shoulder    Limited rom  Pain.  Followed by ortho.  Plans to f/u.       Pulmonary hypertension ([place])    Followed by pulmonary.  Continue nighttime oxygen.       Stress    Followed by psychiatry.  Appears to be stable.  Follow.         Einar Pheasant, MD

## 2022-08-11 ENCOUNTER — Encounter: Payer: Self-pay | Admitting: Internal Medicine

## 2022-08-11 NOTE — Assessment & Plan Note (Signed)
Followed by pulmonary.  Continue nighttime oxygen.

## 2022-08-11 NOTE — Assessment & Plan Note (Signed)
Continue compression hose.  Swelling overall improved.  

## 2022-08-11 NOTE — Assessment & Plan Note (Addendum)
Chronic issue.  Limits activity.  Seeing ortho.  Home health PT.

## 2022-08-11 NOTE — Assessment & Plan Note (Signed)
On thyroid replacement.  Follow tsh.  

## 2022-08-11 NOTE — Assessment & Plan Note (Signed)
Continue aciphex.  Followed by GI.

## 2022-08-11 NOTE — Assessment & Plan Note (Signed)
Continue lipitor.

## 2022-08-11 NOTE — Assessment & Plan Note (Signed)
Continues on gabapentin.  Follow.

## 2022-08-11 NOTE — Assessment & Plan Note (Signed)
Followed by psychiatry.  Appears to be stable.  Follow.  °

## 2022-08-11 NOTE — Assessment & Plan Note (Signed)
Just had echo in hospital.  Mild.  Followed by Dr Paraschos.  

## 2022-08-11 NOTE — Assessment & Plan Note (Signed)
Admitted 07/11/22 - 07/15/22 - after presenting with increased sob and cough. PTAdimiission, she was evaluated and CT chest that showed a multifocal pneumonia on the right side but other work-up was unremarkable with normal WBC and negative procalcitonin.  Treated with doxycycline, prednisone and augmentin.  Continued to have increased cough and sob - back to ER and admitted.  Respiratory panel - positive for rhinovirus.  Abx stopped. Given Nebs and Flutter valve.  Oxygen weaned. Discharged home with nocturnal oxygen and plans for PT/OT.  Doing better.  Weakness is improving.  Working with PT.  F/u cxr was done through cardiology.  Need results.

## 2022-08-11 NOTE — Assessment & Plan Note (Signed)
Elevated BNP - ECHO 60% with G1DD.  Diagnosed with acute on chronic diastolic CHF.  Was diuresed.  Symptoms improved. Takes lasix daily.  Weight is down.  No evidence of volume overload on exam.  Continue daily lasix.  Follow metabolic panel.

## 2022-08-11 NOTE — Assessment & Plan Note (Signed)
On lipitor.  Low cholesterol diet and exercise.  Follow lipid panel and liver function tests.   

## 2022-08-11 NOTE — Assessment & Plan Note (Signed)
Uses oxygen at night.  Continue oxygen - nocturnal.  Did not qualify - 5 minute walk in office.

## 2022-08-11 NOTE — Assessment & Plan Note (Signed)
Saw Dr Rogue Bussing 04/12/22 - previous rash to IV venofer.  Oral iron daily.  B12 injections.  Follow cbc.

## 2022-08-11 NOTE — Assessment & Plan Note (Signed)
B12 injections (per hematology).  Given 06/04/22 - recommended q 3 months.

## 2022-08-11 NOTE — Assessment & Plan Note (Signed)
Blood pressure as outlined.  Continue benazepril, amlodipine, lasix and metoprolol.  Follow pressures.  Follow metabolic panel.

## 2022-08-11 NOTE — Assessment & Plan Note (Signed)
Limited rom  Pain.  Followed by ortho.  Plans to f/u.

## 2022-08-12 DIAGNOSIS — M25512 Pain in left shoulder: Secondary | ICD-10-CM | POA: Diagnosis not present

## 2022-08-12 DIAGNOSIS — M25511 Pain in right shoulder: Secondary | ICD-10-CM | POA: Diagnosis not present

## 2022-08-15 DIAGNOSIS — F330 Major depressive disorder, recurrent, mild: Secondary | ICD-10-CM | POA: Diagnosis not present

## 2022-08-15 DIAGNOSIS — F5105 Insomnia due to other mental disorder: Secondary | ICD-10-CM | POA: Diagnosis not present

## 2022-08-15 DIAGNOSIS — F419 Anxiety disorder, unspecified: Secondary | ICD-10-CM | POA: Diagnosis not present

## 2022-08-20 ENCOUNTER — Other Ambulatory Visit: Payer: Self-pay | Admitting: Internal Medicine

## 2022-08-22 DIAGNOSIS — I2510 Atherosclerotic heart disease of native coronary artery without angina pectoris: Secondary | ICD-10-CM | POA: Diagnosis not present

## 2022-08-22 DIAGNOSIS — D649 Anemia, unspecified: Secondary | ICD-10-CM

## 2022-08-22 DIAGNOSIS — G8929 Other chronic pain: Secondary | ICD-10-CM

## 2022-08-22 DIAGNOSIS — I700 Atherosclerosis of aorta: Secondary | ICD-10-CM | POA: Diagnosis not present

## 2022-08-22 DIAGNOSIS — Z87448 Personal history of other diseases of urinary system: Secondary | ICD-10-CM

## 2022-08-22 DIAGNOSIS — Z9089 Acquired absence of other organs: Secondary | ICD-10-CM

## 2022-08-22 DIAGNOSIS — Z86718 Personal history of other venous thrombosis and embolism: Secondary | ICD-10-CM

## 2022-08-22 DIAGNOSIS — Z9181 History of falling: Secondary | ICD-10-CM

## 2022-08-22 DIAGNOSIS — Z7982 Long term (current) use of aspirin: Secondary | ICD-10-CM

## 2022-08-22 DIAGNOSIS — M5030 Other cervical disc degeneration, unspecified cervical region: Secondary | ICD-10-CM

## 2022-08-22 DIAGNOSIS — F32A Depression, unspecified: Secondary | ICD-10-CM

## 2022-08-22 DIAGNOSIS — I110 Hypertensive heart disease with heart failure: Secondary | ICD-10-CM | POA: Diagnosis not present

## 2022-08-22 DIAGNOSIS — M48061 Spinal stenosis, lumbar region without neurogenic claudication: Secondary | ICD-10-CM

## 2022-08-22 DIAGNOSIS — Z6831 Body mass index (BMI) 31.0-31.9, adult: Secondary | ICD-10-CM

## 2022-08-22 DIAGNOSIS — I5033 Acute on chronic diastolic (congestive) heart failure: Secondary | ICD-10-CM | POA: Diagnosis not present

## 2022-08-22 DIAGNOSIS — J449 Chronic obstructive pulmonary disease, unspecified: Secondary | ICD-10-CM | POA: Diagnosis not present

## 2022-08-22 DIAGNOSIS — I060 Rheumatic aortic stenosis: Secondary | ICD-10-CM | POA: Diagnosis not present

## 2022-08-22 DIAGNOSIS — J9621 Acute and chronic respiratory failure with hypoxia: Secondary | ICD-10-CM | POA: Diagnosis not present

## 2022-08-22 DIAGNOSIS — Z79891 Long term (current) use of opiate analgesic: Secondary | ICD-10-CM

## 2022-08-22 DIAGNOSIS — M1610 Unilateral primary osteoarthritis, unspecified hip: Secondary | ICD-10-CM

## 2022-08-22 DIAGNOSIS — I872 Venous insufficiency (chronic) (peripheral): Secondary | ICD-10-CM | POA: Diagnosis not present

## 2022-08-22 DIAGNOSIS — K219 Gastro-esophageal reflux disease without esophagitis: Secondary | ICD-10-CM

## 2022-08-22 DIAGNOSIS — Z96653 Presence of artificial knee joint, bilateral: Secondary | ICD-10-CM

## 2022-08-22 DIAGNOSIS — E039 Hypothyroidism, unspecified: Secondary | ICD-10-CM

## 2022-08-22 DIAGNOSIS — Z87442 Personal history of urinary calculi: Secondary | ICD-10-CM

## 2022-08-22 DIAGNOSIS — G4733 Obstructive sleep apnea (adult) (pediatric): Secondary | ICD-10-CM

## 2022-08-22 DIAGNOSIS — F0630 Mood disorder due to known physiological condition, unspecified: Secondary | ICD-10-CM

## 2022-08-22 DIAGNOSIS — E611 Iron deficiency: Secondary | ICD-10-CM

## 2022-08-22 DIAGNOSIS — M19019 Primary osteoarthritis, unspecified shoulder: Secondary | ICD-10-CM

## 2022-08-22 DIAGNOSIS — E669 Obesity, unspecified: Secondary | ICD-10-CM

## 2022-08-22 DIAGNOSIS — M961 Postlaminectomy syndrome, not elsewhere classified: Secondary | ICD-10-CM

## 2022-08-22 DIAGNOSIS — F419 Anxiety disorder, unspecified: Secondary | ICD-10-CM

## 2022-08-22 DIAGNOSIS — E538 Deficiency of other specified B group vitamins: Secondary | ICD-10-CM

## 2022-08-22 DIAGNOSIS — I272 Pulmonary hypertension, unspecified: Secondary | ICD-10-CM | POA: Diagnosis not present

## 2022-08-22 DIAGNOSIS — M419 Scoliosis, unspecified: Secondary | ICD-10-CM

## 2022-08-22 DIAGNOSIS — M5137 Other intervertebral disc degeneration, lumbosacral region: Secondary | ICD-10-CM

## 2022-08-22 DIAGNOSIS — Z8701 Personal history of pneumonia (recurrent): Secondary | ICD-10-CM

## 2022-08-22 DIAGNOSIS — K5900 Constipation, unspecified: Secondary | ICD-10-CM

## 2022-08-22 DIAGNOSIS — E7800 Pure hypercholesterolemia, unspecified: Secondary | ICD-10-CM

## 2022-08-27 DIAGNOSIS — H16223 Keratoconjunctivitis sicca, not specified as Sjogren's, bilateral: Secondary | ICD-10-CM | POA: Diagnosis not present

## 2022-08-28 DIAGNOSIS — R309 Painful micturition, unspecified: Secondary | ICD-10-CM | POA: Diagnosis not present

## 2022-09-02 DIAGNOSIS — J9601 Acute respiratory failure with hypoxia: Secondary | ICD-10-CM | POA: Diagnosis not present

## 2022-09-02 DIAGNOSIS — R0602 Shortness of breath: Secondary | ICD-10-CM | POA: Diagnosis not present

## 2022-09-02 DIAGNOSIS — J9621 Acute and chronic respiratory failure with hypoxia: Secondary | ICD-10-CM | POA: Diagnosis not present

## 2022-09-02 DIAGNOSIS — J1001 Influenza due to other identified influenza virus with the same other identified influenza virus pneumonia: Secondary | ICD-10-CM | POA: Diagnosis not present

## 2022-09-02 DIAGNOSIS — I5032 Chronic diastolic (congestive) heart failure: Secondary | ICD-10-CM | POA: Diagnosis not present

## 2022-09-02 DIAGNOSIS — R051 Acute cough: Secondary | ICD-10-CM | POA: Diagnosis not present

## 2022-09-02 DIAGNOSIS — D696 Thrombocytopenia, unspecified: Secondary | ICD-10-CM | POA: Diagnosis not present

## 2022-09-02 DIAGNOSIS — D539 Nutritional anemia, unspecified: Secondary | ICD-10-CM | POA: Diagnosis not present

## 2022-09-02 DIAGNOSIS — F32A Depression, unspecified: Secondary | ICD-10-CM | POA: Diagnosis not present

## 2022-09-02 DIAGNOSIS — J101 Influenza due to other identified influenza virus with other respiratory manifestations: Secondary | ICD-10-CM | POA: Diagnosis not present

## 2022-09-02 DIAGNOSIS — E039 Hypothyroidism, unspecified: Secondary | ICD-10-CM | POA: Diagnosis not present

## 2022-09-02 DIAGNOSIS — J129 Viral pneumonia, unspecified: Secondary | ICD-10-CM | POA: Diagnosis not present

## 2022-09-02 DIAGNOSIS — I272 Pulmonary hypertension, unspecified: Secondary | ICD-10-CM | POA: Diagnosis not present

## 2022-09-02 DIAGNOSIS — I110 Hypertensive heart disease with heart failure: Secondary | ICD-10-CM | POA: Diagnosis not present

## 2022-09-03 ENCOUNTER — Telehealth: Payer: Self-pay | Admitting: Internal Medicine

## 2022-09-03 DIAGNOSIS — J101 Influenza due to other identified influenza virus with other respiratory manifestations: Secondary | ICD-10-CM | POA: Diagnosis not present

## 2022-09-03 DIAGNOSIS — J9601 Acute respiratory failure with hypoxia: Secondary | ICD-10-CM | POA: Diagnosis not present

## 2022-09-03 DIAGNOSIS — J129 Viral pneumonia, unspecified: Secondary | ICD-10-CM | POA: Diagnosis not present

## 2022-09-03 DIAGNOSIS — R051 Acute cough: Secondary | ICD-10-CM | POA: Diagnosis not present

## 2022-09-03 NOTE — Telephone Encounter (Signed)
FYI

## 2022-09-03 NOTE — Telephone Encounter (Signed)
Pt called in today to let her provider know she was admitted yesterday at [facility] because she has the flu, and they doing a overall check.pt also would like to see her provider for a hospital follow up when was get released. Pt was informed to give [place] a call when she gets discharged to follow up.

## 2022-09-04 DIAGNOSIS — J9601 Acute respiratory failure with hypoxia: Secondary | ICD-10-CM | POA: Diagnosis not present

## 2022-09-04 DIAGNOSIS — J129 Viral pneumonia, unspecified: Secondary | ICD-10-CM | POA: Diagnosis not present

## 2022-09-04 DIAGNOSIS — J101 Influenza due to other identified influenza virus with other respiratory manifestations: Secondary | ICD-10-CM | POA: Diagnosis not present

## 2022-09-04 DIAGNOSIS — R051 Acute cough: Secondary | ICD-10-CM | POA: Diagnosis not present

## 2022-09-05 ENCOUNTER — Telehealth: Payer: Self-pay | Admitting: Internal Medicine

## 2022-09-05 ENCOUNTER — Telehealth: Payer: Self-pay | Admitting: *Deleted

## 2022-09-05 NOTE — Telephone Encounter (Signed)
Stacy from [facility](TOC nurse) called looking for a Hospital F/U for pt. Pt was discharged yesterday and went home. However, I don't see any open available appts to place her in.

## 2022-09-05 NOTE — Patient Outreach (Signed)
  Care Coordination [facility] Note Transition Care Management Follow-up Telephone Call Date of discharge and from where: [facility] [id number] influenza How have you been since you were released from the hospital? Doing good Any questions or concerns? No  Items Reviewed: Did the pt receive and understand the discharge instructions provided? Yes  Medications obtained and verified? Yes Patient understands to take the Tamiflu last dose Saturday [id number] Other? No  Any new allergies since your discharge? No  Dietary orders reviewed? No Do you have support at home? Yes   Home Care and Equipment/Supplies: Were home health services ordered? Yes PT If so, what is the name of the agency? [facility]  Has the agency set up a time to come to the patient's home? Yes  Were any new equipment or medical supplies ordered?  Yes:   rolling walker What is the name of the medical supply agency? [employer] Were you able to get the supplies/equipment? Declined because of cost Do you have any questions related to the use of the equipment or supplies? No  Functional Questionnaire: (I = Independent and D = Dependent) ADLs: I  Bathing/Dressing- I  Meal Prep- I  Eating- I  Maintaining continence- I  Transferring/Ambulation- I  Managing Meds- I  Follow up appointments reviewed:  PCP Hospital f/u appt confirmed? N RN sent for scheduling. [facility] f/u appt confirmed? Yes  Kimberly Roy [id number] 2:00 Are transportation arrangements needed? No  If their condition worsens, is the pt aware to call PCP or go to the Emergency Dept.? Yes Was the patient provided with contact information for the PCP's office or ED? Yes Was to pt encouraged to call back with questions or concerns? Yes  SDOH assessments and interventions completed:   Yes SDOH Interventions Today    Flowsheet Row Most Recent Value  SDOH Interventions   Food Insecurity Interventions Intervention Not Indicated  Housing Interventions Intervention Not  Indicated  Transportation Interventions Intervention Not Indicated  [per patient she has transportation to her appointments]       Care Coordination Interventions:  PCP follow up appointment requested   Encounter Outcome:  Pt. Visit Completed   [facility] [phone]

## 2022-09-06 ENCOUNTER — Telehealth: Payer: Self-pay

## 2022-09-06 NOTE — Telephone Encounter (Signed)
Soni called from [facility] to state when she saw the patient today, patient had abnormal lung sounds.  Soni states she will be faxing request for physical therapy and nurse evaluation for patient.

## 2022-09-06 NOTE — Telephone Encounter (Signed)
Pt sched for 09/11/2022

## 2022-09-10 ENCOUNTER — Telehealth: Payer: Self-pay | Admitting: Internal Medicine

## 2022-09-10 NOTE — Telephone Encounter (Signed)
Bonnie from [employer] called stating pt and son was released from the hospital on the 1/3 and they was diagnosed with the flu. Pt and son was given medication and but they can not get rid of the cough. Pt and son was given tessalon pearls to help with cough but thy did not help. Pt would like to be called by cma

## 2022-09-10 NOTE — Telephone Encounter (Signed)
LM for pt to cb

## 2022-09-11 ENCOUNTER — Ambulatory Visit (INDEPENDENT_AMBULATORY_CARE_PROVIDER_SITE_OTHER): Payer: Medicare HMO | Admitting: Internal Medicine

## 2022-09-11 ENCOUNTER — Ambulatory Visit
Admission: RE | Admit: 2022-09-11 | Discharge: 2022-09-11 | Disposition: A | Discharge status: Home / Self Care | Payer: Medicare HMO | Attending: Internal Medicine | Admitting: Internal Medicine

## 2022-09-11 ENCOUNTER — Encounter: Payer: Self-pay | Admitting: Internal Medicine

## 2022-09-11 ENCOUNTER — Ambulatory Visit
Admission: RE | Admit: 2022-09-11 | Discharge: 2022-09-11 | Disposition: A | Discharge status: Home / Self Care | Payer: Medicare HMO | Source: Ambulatory Visit | Attending: Internal Medicine | Admitting: Internal Medicine

## 2022-09-11 ENCOUNTER — Ambulatory Visit: Payer: Medicare HMO

## 2022-09-11 VITALS — BP 120/62 | HR 78 | Temp 97.6°F | Resp 19 | Ht 62.0 in | Wt 176.6 lb

## 2022-09-11 DIAGNOSIS — K219 Gastro-esophageal reflux disease without esophagitis: Secondary | ICD-10-CM

## 2022-09-11 DIAGNOSIS — J9611 Chronic respiratory failure with hypoxia: Secondary | ICD-10-CM

## 2022-09-11 DIAGNOSIS — I700 Atherosclerosis of aorta: Secondary | ICD-10-CM

## 2022-09-11 DIAGNOSIS — I10 Essential (primary) hypertension: Secondary | ICD-10-CM | POA: Diagnosis not present

## 2022-09-11 DIAGNOSIS — R059 Cough, unspecified: Secondary | ICD-10-CM | POA: Insufficient documentation

## 2022-09-11 DIAGNOSIS — D649 Anemia, unspecified: Secondary | ICD-10-CM | POA: Diagnosis not present

## 2022-09-11 DIAGNOSIS — R0602 Shortness of breath: Secondary | ICD-10-CM

## 2022-09-11 DIAGNOSIS — I509 Heart failure, unspecified: Secondary | ICD-10-CM

## 2022-09-11 DIAGNOSIS — R062 Wheezing: Secondary | ICD-10-CM

## 2022-09-11 DIAGNOSIS — I5032 Chronic diastolic (congestive) heart failure: Secondary | ICD-10-CM | POA: Diagnosis not present

## 2022-09-11 DIAGNOSIS — I350 Nonrheumatic aortic (valve) stenosis: Secondary | ICD-10-CM | POA: Diagnosis not present

## 2022-09-11 DIAGNOSIS — H6191 Disorder of right external ear, unspecified: Secondary | ICD-10-CM

## 2022-09-11 DIAGNOSIS — I272 Pulmonary hypertension, unspecified: Secondary | ICD-10-CM

## 2022-09-11 MED ORDER — DOXYCYCLINE HYCLATE 100 MG PO TABS: 100.0000 mg | ORAL_TABLET | Freq: Two times a day (BID) | ORAL | 0 refills | Status: DC

## 2022-09-11 MED ORDER — MUPIROCIN 2 % EX OINT: 1.0000 | TOPICAL_OINTMENT | Freq: Two times a day (BID) | CUTANEOUS | 0 refills | Status: DC

## 2022-09-11 MED ORDER — PREDNISONE 10 MG PO TABS: ORAL_TABLET | ORAL | 0 refills | Status: DC

## 2022-09-11 NOTE — Progress Notes (Unsigned)
Subjective:    Patient ID: Kimberly Roy, female    DOB: 1931-05-30, 89 y.o.   MRN: [id number]  Patient here for  Chief Complaint  Patient presents with   Hospitalization Follow-up    HPI Here for hospital follow up.  Admitted 09/02/22 - 09/04/22 - diagnosed with influenza.  Treated with tamiflu.  Increased oxygen requirements.  Chest PT. Albuterol prn. Elevated pro BNP.  Discharged to continue home lasix.  Continued on po cipro - for UTI.  Off abx now.  Reports persistent cough and congestion.  Using oxygen at night.  Reports increased nasal congestion.  Some thick yellow mucus production - with cough.  Chest congestion.  Wheezing. Using inhaler. Discussed nebulizer.  Feels helped during hospitalization. Also discussed incentive spirometer.  No vomiting or diarrhea.  She is eating.  Discussed home health.  PT came out and evaluated today.  Discussed home exercise.  Will treat above and wanted to hold on PT with plans for reassessment in 30 days.  No chest pain.  No abdominal pain.  Also reports ear lesion.    Past Medical History:  Diagnosis Date   Anemia    Anxiety    Chest pain    CHF (congestive heart failure) (HCC)    Constipation    DDD (degenerative disc disease), cervical    Depression    DVT (deep venous thrombosis) (HCC)    Dysphonia    Dyspnea    Fatty liver    Fatty liver    Headache    Hyperlipidemia    Hyperpiesia    Hypertension    Hypothyroidism    Interstitial cystitis    Left ventricular dysfunction    Lymphedema    Nephrolithiasis    Obstructive sleep apnea    Osteoarthritis    knees/cervical and lumbar spine   Pulmonary hypertension (HCC)    Pulmonary nodules    followed by Dr Raul Del   Pure hypercholesterolemia    Renal cyst    right   Past Surgical History:  Procedure Laterality Date   ABDOMINAL HYSTERECTOMY     ovaries left in place   APPENDECTOMY     Back Surgeries     BACK SURGERY     BREAST REDUCTION SURGERY     3/99   CARDIAC  CATHETERIZATION     cataracts Bilateral    CERVICAL SPINE SURGERY     ESOPHAGEAL MANOMETRY N/A 08/02/2015   Procedure: ESOPHAGEAL MANOMETRY (EM);  Surgeon: Josefine Class, MD;  Location: [facility] ENDOSCOPY;  Service: Endoscopy;  Laterality: N/A;   ESOPHAGOGASTRODUODENOSCOPY N/A 02/27/2015   Procedure: ESOPHAGOGASTRODUODENOSCOPY (EGD);  Surgeon: Hulen Luster, MD;  Location: [facility] ENDOSCOPY;  Service: Gastroenterology;  Laterality: N/A;   ESOPHAGOGASTRODUODENOSCOPY (EGD) WITH PROPOFOL N/A 10/30/2018   Procedure: ESOPHAGOGASTRODUODENOSCOPY (EGD) WITH PROPOFOL;  Surgeon: Lollie Sails, MD;  Location: [facility] ENDOSCOPY;  Service: Endoscopy;  Laterality: N/A;   EXCISIONAL HEMORRHOIDECTOMY     EYE SURGERY     FRACTURE SURGERY     HEMORRHOID SURGERY     HIP SURGERY  2013   Right hip surgery   JOINT REPLACEMENT     KNEE ARTHROSCOPY     left and right   ORIF FEMUR FRACTURE Right 08/07/2018   Procedure: OPEN REDUCTION INTERNAL FIXATION (ORIF) DISTAL FEMUR FRACTURE;  Surgeon: Hessie Knows, MD;  Location: [facility] ORS;  Service: Orthopedics;  Laterality: Right;   REDUCTION MAMMAPLASTY Bilateral YRS AGO   REPLACEMENT TOTAL KNEE Bilateral    rotator cuff surgery  blilateral   TONSILECTOMY/ADENOIDECTOMY WITH MYRINGOTOMY     VISCERAL ARTERY INTERVENTION N/A 02/25/2019   Procedure: VISCERAL ARTERY INTERVENTION;  Surgeon: Algernon Huxley, MD;  Location: [place] CV LAB;  Service: Cardiovascular;  Laterality: N/A;   Family History  Problem Relation Age of Onset   Heart disease Mother    Stroke Mother    Hypertension Mother    Heart disease Father        myocardial infarction age 4   Breast cancer Neg Hx    Social History   Socioeconomic History   Marital status: Widowed    Spouse name: Not on file   Number of children: 2   Years of education: 39   Highest education level: 12th grade  Occupational History    Comment: insurance agency  Tobacco Use   Smoking status: Never   Smokeless tobacco:  Never  Vaping Use   Vaping Use: Never used  Substance and Sexual Activity   Alcohol use: No    Alcohol/week: 0.0 standard drinks of alcohol   Drug use: Never   Sexual activity: Not Currently  Other Topics Concern   Not on file  Social History Narrative   Not on file   Social Determinants of Health   Financial Resource Strain: Low Risk  (03/09/2021)   Overall Financial Resource Strain (CARDIA)    Difficulty of Paying Living Expenses: Not hard at all  Food Insecurity: No Food Insecurity (09/05/2022)   Hunger Vital Sign    Worried About Running Out of Food in the Last Year: Never true    [place] in the Last Year: Never true  Transportation Needs: Unmet Transportation Needs (09/05/2022)   PRAPARE - Transportation    Lack of Transportation (Medical): Yes    Lack of Transportation (Non-Medical): Yes  Physical Activity: Inactive (03/09/2021)   Exercise Vital Sign    Days of Exercise per Week: 0 days    Minutes of Exercise per Session: 0 min  Stress: No Stress Concern Present (03/09/2021)   [place]    Feeling of Stress : Only a little  Social Connections: Socially Isolated (11/15/2021)   Social Connection and Isolation Panel [NHANES]    Frequency of Communication with Friends and Family: Three times a week    Frequency of Social Gatherings with Friends and Family: Twice a week    Attends Religious Services: Never    Marine scientist or Organizations: No    Attends Archivist Meetings: Never    Marital Status: Widowed     Review of Systems  Constitutional:  Positive for fatigue. Negative for fever.  HENT:  Positive for congestion and postnasal drip.   Respiratory:  Positive for cough and wheezing. Negative for chest tightness.   Cardiovascular:  Negative for chest pain and palpitations.       Lower extremity swelling - stable.  Wearing compression hose.   Gastrointestinal:  Negative for  abdominal pain, diarrhea, nausea and vomiting.  Genitourinary:  Negative for difficulty urinating and dysuria.  Musculoskeletal:  Negative for joint swelling and myalgias.  Skin:  Negative for color change and rash.  Neurological:  Negative for dizziness and headaches.  Psychiatric/Behavioral:  Negative for agitation and dysphoric mood.        Objective:     BP 120/62 (BP Location: Left Arm, Patient Position: Sitting, Cuff Size: Large)   Pulse 78   Temp 97.6 F (36.4 C) (Temporal)  Resp 19   Ht _0  (1.575 m)   Wt 176 lb 9.6 oz (80.1 kg)   SpO2 96%   BMI 32.30 kg/m  Wt Readings from Last 3 Encounters:  09/11/22 176 lb 9.6 oz (80.1 kg)  08/07/22 173 lb 11.2 oz (78.8 kg)  07/19/22 170 lb (77.1 kg)    Physical Exam Vitals reviewed.  Constitutional:      General: She is not in acute distress.    Appearance: Normal appearance.  HENT:     Head: Normocephalic and atraumatic.     Right Ear: External ear normal.     Left Ear: External ear normal.     Ears:     Comments: Right ear - minimal irritation.  Eyes:     General: No scleral icterus.       Right eye: No discharge.        Left eye: No discharge.     Conjunctiva/sclera: Conjunctivae normal.  Neck:     Thyroid: No thyromegaly.  Cardiovascular:     Rate and Rhythm: Normal rate and regular rhythm.  Pulmonary:     Comments: Increased cough with expiration.  Wheezing.   Abdominal:     General: Bowel sounds are normal.     Palpations: Abdomen is soft.     Tenderness: There is no abdominal tenderness.  Musculoskeletal:        General: No tenderness.     Cervical back: Neck supple. No tenderness.     Comments: No increased swelling.  Stable.   Lymphadenopathy:     Cervical: No cervical adenopathy.  Skin:    Findings: No erythema or rash.  Neurological:     Mental Status: She is alert.  Psychiatric:        Mood and Affect: Mood normal.        Behavior: Behavior normal.      Outpatient Encounter Medications  as of 09/11/2022  Medication Sig   albuterol (VENTOLIN HFA) 108 (90 Base) MCG/ACT inhaler INHALE 2 PUFFS INTO THE LUNGS EVERY 6 HOURS AS NEEDED FOR WHEEZING OR SHORTNESS OF BREATH   aluminum-magnesium hydroxide-simethicone (MAALOX) 200-200-20 MG/5ML SUSP Take 15 mLs by mouth 4 (four) times daily -  before meals and at bedtime.    amLODipine (NORVASC) 10 MG tablet TAKE 1 TABLET BY MOUTH DAILY   aspirin 81 MG EC tablet Take 81 mg by mouth daily. Every other day   atorvastatin (LIPITOR) 20 MG tablet TAKE 1 TABLET BY MOUTH AT BEDTIME   benazepril (LOTENSIN) 40 MG tablet TAKE 1 TABLET BY MOUTH DAILY   betamethasone dipropionate 0.05 % cream Apply 1 application topically 2 (two) times daily.   busPIRone (BUSPAR) 10 MG tablet Take 10 mg by mouth 3 (three) times daily.   calcium citrate-vitamin D (CITRACAL+D) 315-200 MG-UNIT tablet Take 2 tablets by mouth daily.   Cholecalciferol (VITAMIN D3) 1000 UNITS CAPS Take 1 capsule by mouth daily.   clobetasol cream (TEMOVATE) 1.61 % Apply 1 application topically See admin instructions. Apply to affected areas of body 1 - 2 times daily as needed for itchy bumps. Avoid applying to face, groin, and axilla. Use as directed.   cyanocobalamin (VITAMIN B12) 1000 MCG/ML injection Inject 1 mL (1,000 mcg total) into the muscle every 3 (three) months.   Diclofenac Sodium 3 % GEL Apply topically 2 (two) times daily.   Docusate Sodium (DSS) 100 MG CAPS Take 100 mg by mouth 2 (two) times daily.   doxycycline (VIBRA-TABS) 100 MG tablet Take  1 tablet (100 mg total) by mouth 2 (two) times daily.   DULoxetine (CYMBALTA) 60 MG capsule Take 60 mg by mouth daily.   Fluocinolone Acetonide 0.01 % OIL Apply 1-2 drops into ears once to twice daily as needed.   fluocinonide (LIDEX) 0.05 % external solution Apply topically 2 (two) times daily. Apply drops to scalp for itch   fluticasone (FLONASE) 50 MCG/ACT nasal spray Place 2 sprays into both nostrils daily.   furosemide (LASIX) 20 MG  tablet TAKE 1 TABLET BY MOUTH DAILY AS NEEDED   gabapentin (NEURONTIN) 300 MG capsule Take two tablets tid (in am, midday and q hs).   HYDROcodone-acetaminophen (NORCO) 10-325 MG tablet Take 1 tablet by mouth 3 (three) times daily.   Infant Care Products [facility]) OINT Apply topically as directed (as discussed).   ipratropium (ATROVENT) 0.03 % nasal spray Place 2 sprays into both nostrils every 12 (twelve) hours.   ketoconazole (NIZORAL) 2 % shampoo Apply 1 Application topically See admin instructions. apply three times per week, massage into scalp and leave in for 10 minutes before rinsing out   levalbuterol (XOPENEX) 0.63 MG/3ML nebulizer solution Take 3 mLs (0.63 mg total) by nebulization every 6 (six) hours as needed for wheezing or shortness of breath.   LINZESS 145 MCG CAPS capsule Take 145 mcg by mouth daily.   magnesium oxide (MAG-OX) 400 MG tablet Take 1 tablet (400 mg total) by mouth daily.   metoprolol succinate (TOPROL-XL) 50 MG 24 hr tablet TAKE 1 TABLET BY MOUTH DAILY. TAKE WITH OR IMMEDIATELY FOLLOWING A MEAL.   morphine (MS CONTIN) 30 MG 12 hr tablet Take 30 mg by mouth every 12 (twelve) hours.   morphine (MS CONTIN) 60 MG 12 hr tablet Take 1 tablet (60 mg total) by mouth 2 (two) times daily.   mupirocin ointment (BACTROBAN) 2 % Apply 1 Application topically 2 (two) times daily.   Naldemedine Tosylate 0.2 MG TABS Take 0.2 mg by mouth daily.   nystatin (MYCOSTATIN/NYSTOP) powder Apply 1 application. topically 2 (two) times daily as needed.   nystatin cream (MYCOSTATIN) Apply 1 Application topically 2 (two) times daily.   polyethylene glycol powder (GLYCOLAX/MIRALAX) 17 GM/SCOOP powder MIX 17 GRAMS AS MARKED ON BOTTLE TOP IN 8 OUNCES OF WATER AND DRINK ONCE A DAY AS DIRECTED.   predniSONE (DELTASONE) 10 MG tablet Take 4 tablets x 1 day and then decrease by 1/2 tablet per day until down to zero mg.   RABEprazole (ACIPHEX) 20 MG tablet TAKE 1 TABLET BY MOUTH TWICE A DAY BEFORE A MEAL    senna (SENOKOT) 8.6 MG TABS tablet Take 1 tablet by mouth 2 (two) times daily.   Simethicone (GAS-X PO) Take 2 tablets by mouth 2 (two) times daily.   sodium chloride (OCEAN) 0.65 % nasal spray Place 1 spray into the nose as needed.   SYNTHROID 100 MCG tablet Take 1 tablet (100 mcg total) by mouth daily.   triamcinolone ointment (KENALOG) 0.1 % APPLY TWICE DAILY TO BITES AND RASH UNTIL FLAT AND SMOOTH **DO NOT APPLY TO FACE**   No facility-administered encounter medications on file as of 09/11/2022.     Reviewed and updated medication list.    Lab Results  Component Value Date   WBC 7.3 07/19/2022   HGB 10.0 (L) 07/19/2022   HCT 30.6 (L) 07/19/2022   PLT 232.0 07/19/2022   GLUCOSE 92 07/19/2022   CHOL 143 01/21/2022   TRIG 108.0 01/21/2022   HDL 57.90 01/21/2022   LDLCALC  63 01/21/2022   ALT 18 07/11/2022   AST 25 07/11/2022   NA 140 07/19/2022   K 5.2 No hemolysis seen (H) 07/19/2022   CL 100 07/19/2022   CREATININE 0.95 07/19/2022   BUN 21 07/19/2022   CO2 33 (H) 07/19/2022   TSH 4.63 01/21/2022   INR 0.89 08/06/2018   HGBA1C 6.0 10/19/2014    ECHOCARDIOGRAM COMPLETE  Result Date: 07/14/2022    ECHOCARDIOGRAM REPORT   Patient Name:   [facility] Date of Exam: 07/14/2022 Medical Rec #:  [id number]   Height:       62.0 in Accession #:    [id number]  Weight:       174.0 lb Date of Birth:  05/01/1931    BSA:          1.802 m Patient Age:    80 years    BP:           126/45 mmHg Patient Gender: F           HR:           70 bpm. Exam Location:  [facility] Procedure: 2D Echo Indications:     Cardiomyopathy I42.9  History:         Patient has no prior history of Echocardiogram examinations.  Sonographer:     Kathlen Brunswick RDCS Referring Phys:  [id number] Jeanella Flattery AMIN Diagnosing Phys: Isaias Cowman MD  Sonographer Comments: Technically difficult study due to poor echo windows and suboptimal apical window. Image acquisition challenging due to respiratory motion. IMPRESSIONS  1.  Left ventricular ejection fraction, by estimation, is 60 to 65%. The left ventricle has normal function. The left ventricle has no regional wall motion abnormalities. There is mild left ventricular hypertrophy. Left ventricular diastolic parameters are consistent with Grade I diastolic dysfunction (impaired relaxation).  2. Right ventricular systolic function is normal. The right ventricular size is normal.  3. The mitral valve is normal in structure. Mild mitral valve regurgitation. No evidence of mitral stenosis.  4. Mild to moderate tricuspid stenosis.  5. The aortic valve is normal in structure. Aortic valve regurgitation is mild. Mild aortic valve stenosis.  6. The inferior vena cava is normal in size with greater than 50% respiratory variability, suggesting right atrial pressure of 3 mmHg. FINDINGS  Left Ventricle: Left ventricular ejection fraction, by estimation, is 60 to 65%. The left ventricle has normal function. The left ventricle has no regional wall motion abnormalities. The left ventricular internal cavity size was normal in size. There is  mild left ventricular hypertrophy. Left ventricular diastolic parameters are consistent with Grade I diastolic dysfunction (impaired relaxation). Right Ventricle: The right ventricular size is normal. No increase in right ventricular wall thickness. Right ventricular systolic function is normal. Left Atrium: Left atrial size was normal in size. Right Atrium: Right atrial size was normal in size. Pericardium: There is no evidence of pericardial effusion. Mitral Valve: The mitral valve is normal in structure. Mild mitral valve regurgitation. No evidence of mitral valve stenosis. Tricuspid Valve: The tricuspid valve is normal in structure. Tricuspid valve regurgitation is mild . Mild to moderate tricuspid stenosis. Aortic Valve: The aortic valve is normal in structure. Aortic valve regurgitation is mild. Aortic regurgitation PHT measures 606 msec. Mild aortic stenosis  is present. Aortic valve mean gradient measures 11.0 mmHg. Aortic valve peak gradient measures 24.8 mmHg. Aortic valve area, by VTI measures 1.24 cm. Pulmonic Valve: The pulmonic valve was normal in structure. Pulmonic valve regurgitation is not  visualized. No evidence of pulmonic stenosis. Aorta: The aortic root is normal in size and structure. Venous: The inferior vena cava is normal in size with greater than 50% respiratory variability, suggesting right atrial pressure of 3 mmHg. IAS/Shunts: No atrial level shunt detected by color flow Doppler.  LEFT VENTRICLE PLAX 2D LVIDd:         4.60 cm   Diastology LVIDs:         3.00 cm   LV e' medial:    6.31 cm/s LV PW:         1.40 cm   LV E/e' medial:  11.5 LV IVS:        1.20 cm   LV e' lateral:   7.94 cm/s LVOT diam:     1.80 cm   LV E/e' lateral: 9.2 LV SV:         70 LV SV Index:   39 LVOT Area:     2.54 cm  LEFT ATRIUM             Index LA diam:        4.70 cm 2.61 cm/m LA Vol (A2C):   30.2 ml 16.76 ml/m LA Vol (A4C):   19.4 ml 10.77 ml/m LA Biplane Vol: 25.5 ml 14.15 ml/m  AORTIC VALVE                     PULMONIC VALVE AV Area (Vmax):    1.10 cm      PV Vmax:          1.27 m/s AV Area (Vmean):   1.17 cm      PV Peak grad:     6.5 mmHg AV Area (VTI):     1.24 cm      PR End Diast Vel: 5.48 msec AV Vmax:           249.00 cm/s AV Vmean:          145.000 cm/s AV VTI:            0.563 m AV Peak Grad:      24.8 mmHg AV Mean Grad:      11.0 mmHg LVOT Vmax:         108.00 cm/s LVOT Vmean:        66.500 cm/s LVOT VTI:          0.274 m LVOT/AV VTI ratio: 0.49 AI PHT:            606 msec  AORTA Ao Root diam: 3.10 cm Ao Asc diam:  3.10 cm MITRAL VALVE               TRICUSPID VALVE MV Area (PHT): 2.21 cm    TR Peak grad:   41.2 mmHg MV Decel Time: 343 msec    TR Vmax:        321.00 cm/s MV E velocity: 72.80 cm/s MV A velocity: 95.40 cm/s  SHUNTS MV E/A ratio:  0.76        Systemic VTI:  0.27 m                            Systemic Diam: 1.80 cm Isaias Cowman MD  Electronically signed by Isaias Cowman MD Signature Date/Time: 07/14/2022/12:34:06 PM    Final        Assessment & Plan:  Cough, unspecified type Assessment & Plan: Recent admission with flu.  Was feeling some better, but reports  since being home, increased cough, congestion and wheezing.  Will cover for bacterial infection.  Doxycycline.  Also prednisone taper as directed.  Check cxr.  Albuterol neb as directed.  Also incentive spirometer.  Follow closely.  Call with update.  Discussed home PT as outlined.    Orders: -     DG Chest 2 View; Future  Chronic respiratory failure with hypoxia (HCC) Assessment & Plan: Uses oxygen at night.  Continue oxygen - nocturnal.      Orders: -     For home use only DME Nebulizer machine -     For home use only DME Other see comment  Congestive heart failure, unspecified HF chronicity, unspecified heart failure type ([place]) Assessment & Plan: ECHO 60% with G1DD.  No evidence of volume overload on exam.  Continue daily lasix.  Follow metabolic panel.   Orders: -     For home use only DME Nebulizer machine -     For home use only DME Other see comment  Wheezing -     For home use only DME Nebulizer machine -     For home use only DME Other see comment  SOB (shortness of breath) -     For home use only DME Nebulizer machine -     For home use only DME Other see comment  Anemia, unspecified type Assessment & Plan: Saw Dr Rogue Bussing 04/12/22 - previous rash to IV venofer.  Oral iron daily.  B12 injections.  Follow cbc.    Aortic atherosclerosis (HCC) Assessment & Plan: Continue lipitor.    Aortic valve stenosis, etiology of cardiac valve disease unspecified Assessment & Plan: Mild.  Followed by Dr Saralyn Pilar.    Chronic diastolic CHF (congestive heart failure) (HCC) Assessment & Plan: Euvolemic clinically.  Continue benazepril, metoprolol and lasix.    Gastroesophageal reflux disease, unspecified whether esophagitis  present Assessment & Plan: Continue aciphex.  Followed by GI.    Primary hypertension Assessment & Plan: Blood pressure as outlined.  Continue benazepril, amlodipine, lasix and metoprolol.  Follow pressures.  Follow metabolic panel.    Pulmonary hypertension ([place]) Assessment & Plan: Followed by pulmonary.  Continue nighttime oxygen.    Earlobe lesion, right Assessment & Plan: Bactroban as directed.  Follow.    Other orders -     Doxycycline Hyclate; Take 1 tablet (100 mg total) by mouth 2 (two) times daily.  Dispense: 20 tablet; Refill: 0 -     predniSONE; Take 4 tablets x 1 day and then decrease by 1/2 tablet per day until down to zero mg.  Dispense: 18 tablet; Refill: 0 -     Mupirocin; Apply 1 Application topically 2 (two) times daily.  Dispense: 22 g; Refill: 0     Einar Pheasant, MD

## 2022-09-11 NOTE — Telephone Encounter (Signed)
Already addressed - Pt w/ appt today - son w/ appt as well w/ Ollen Gross

## 2022-09-11 NOTE — Telephone Encounter (Signed)
LM for Horris Latino to advise

## 2022-09-12 ENCOUNTER — Encounter: Payer: Self-pay | Admitting: Internal Medicine

## 2022-09-12 DIAGNOSIS — H6191 Disorder of right external ear, unspecified: Secondary | ICD-10-CM | POA: Insufficient documentation

## 2022-09-12 NOTE — Assessment & Plan Note (Signed)
Continue lipitor.

## 2022-09-12 NOTE — Assessment & Plan Note (Signed)
Saw Dr Brahmanday 04/12/22 - previous rash to IV venofer.  Oral iron daily.  B12 injections.  Follow cbc.  

## 2022-09-12 NOTE — Assessment & Plan Note (Signed)
Continue aciphex.  Followed by GI.

## 2022-09-12 NOTE — Assessment & Plan Note (Signed)
Euvolemic clinically.  Continue benazepril, metoprolol and lasix.

## 2022-09-12 NOTE — Assessment & Plan Note (Signed)
Mild.  Followed by Dr Saralyn Pilar.

## 2022-09-12 NOTE — Assessment & Plan Note (Signed)
Bactroban as directed.  Follow.

## 2022-09-12 NOTE — Assessment & Plan Note (Signed)
Blood pressure as outlined.  Continue benazepril, amlodipine, lasix and metoprolol.  Follow pressures.  Follow metabolic panel.

## 2022-09-12 NOTE — Assessment & Plan Note (Signed)
Recent admission with flu.  Was feeling some better, but reports since being home, increased cough, congestion and wheezing.  Will cover for bacterial infection.  Doxycycline.  Also prednisone taper as directed.  Check cxr.  Albuterol neb as directed.  Also incentive spirometer.  Follow closely.  Call with update.  Discussed home PT as outlined.

## 2022-09-12 NOTE — Assessment & Plan Note (Signed)
Followed by pulmonary.  Continue nighttime oxygen.

## 2022-09-12 NOTE — Assessment & Plan Note (Signed)
ECHO 60% with G1DD.  No evidence of volume overload on exam.  Continue daily lasix.  Follow metabolic panel.

## 2022-09-12 NOTE — Assessment & Plan Note (Signed)
Uses oxygen at night.  Continue oxygen - nocturnal.

## 2022-09-13 ENCOUNTER — Telehealth: Payer: Self-pay

## 2022-09-13 ENCOUNTER — Telehealth: Payer: Self-pay | Admitting: Internal Medicine

## 2022-09-13 NOTE — Telephone Encounter (Signed)
The doxycycline should cover if any infection.  Stop the bactroban.  Monitor closely. If continues to worsen, will need to be reevaluated.

## 2022-09-13 NOTE — Telephone Encounter (Signed)
S/w pt - Pt stated bactroban is making area more irritated and red.  Stated she can barely touch it and it is more painful today then it was at visit.

## 2022-09-13 NOTE — Telephone Encounter (Signed)
Pt called staying that she was seen yesterday by Dr. Nicki Reaper, however, she got prescribed Mupirocin 2% for her ear, and she said it just made it worst. She would like to know if theres anything she can do? She would like another med if possible. She's available at [phone].

## 2022-09-13 NOTE — Telephone Encounter (Signed)
Patient states she is calling to see if we have the results of her chest x-ray.

## 2022-09-13 NOTE — Telephone Encounter (Signed)
Pt advised.

## 2022-09-13 NOTE — Telephone Encounter (Signed)
She just had a crusted/irritated spot on her outer ear.  Bactroban usually is well tolerated.  Need to know what specifically is worse with the ear.  What new/worsening symptoms is she having?

## 2022-09-16 ENCOUNTER — Encounter: Payer: Medicare HMO | Admitting: Dermatology

## 2022-09-16 ENCOUNTER — Telehealth: Payer: Self-pay | Admitting: Internal Medicine

## 2022-09-16 DIAGNOSIS — R053 Chronic cough: Secondary | ICD-10-CM

## 2022-09-16 MED ORDER — AZITHROMYCIN 250 MG PO TABS: ORAL_TABLET | ORAL | 0 refills | Status: AC

## 2022-09-16 NOTE — Telephone Encounter (Signed)
She has listed allergy to biaxin.  Zpak is in the same family as biaxin.  Has she taken a zpak previously? Any reaction?  Need to confirm no allergy to zpak.  Also, she is on prednisone, doxycyline and I increased her lasix to bid for three days.  If she is no better and symptoms are worsening, she needs to be reevaluated.

## 2022-09-16 NOTE — Telephone Encounter (Signed)
Pt daughter Kimberly Roy called in staying that's her mom Kimberly Roy, chest congestion got worst. And she would like to consider changing her meds(antibiotics) to Z-pack. Her daughter its available at [phone]. She would like to f/u Kimberly Roy concerning this issue.

## 2022-09-16 NOTE — Telephone Encounter (Signed)
Per pt no issue with taking zpak in past.  Pt and Pt daughter advised with prednisone, doxy, and increased lasix x 3 days, pt not getting better, needs to be seen. Agreeable  - scheduled with [facility] Wednesday morning 840am

## 2022-09-16 NOTE — Telephone Encounter (Signed)
S/w [name] and Kimberly Roy - Kimberly Roy still w/ congestion in chest. Kimberly Roy has been coughing all weekend, but hard to cough anything up, feels weak Mucous becoming more colored - green/brown Doing nebulizer treatments - albuterol Doesn't feel she is getting better. Wishes to change from doxy to zpak - doesn't feel like doxy is working.

## 2022-09-17 ENCOUNTER — Telehealth: Payer: Self-pay

## 2022-09-17 NOTE — Telephone Encounter (Signed)
Late entry from Access nurse 09/16/22  [place] RECORD AccessNurse Patient Name: Kimberly Roy Leon Gender: Female DOB: 1931-05-16 Age: 89 Y 18 M 12 D Return Phone Number: [phone] (Primary) Address: City/ State/ Zip: [place] [address] Client [place] Night - Cl Client Site [place] Provider Einar Pheasant - MD Contact Type Call Who Is Calling Patient / Member / Family / Caregiver Call Type Triage / Clinical Caller Name Javier Docker Relationship To Patient Daughter Return Phone Number [phone] (Primary) Chief Complaint Cold Symptom Reason for Call Symptomatic / Request for [place] states patient is getting worse with her chest congestion. Caller states she needs the antibiotic changed to something stronger. Translation No Nurse Assessment Nurse: Gildardo Pounds, RN, Amy Date/Time (Eastern Time): 09/16/2022 9:07:32 AM Confirm and document reason for call. If symptomatic, describe symptoms. ---Caller states her mother is getting worse with her chest congestion. Caller states she needs the antibiotic changed to something stronger. She started taking Doxycycline Wed. She is coughing a lot. She got a nebulizer over the weekend. Thick yellow mucus coming up. Low grade fever of 99. Sx first started Nov. 1st. Dx with pneumonia. Home in Dec. ER on 1/1. Admitted again. Worse since the 3rd. Does the patient have any new or worsening symptoms? ---Yes Will a triage be completed? ---Yes Related visit to physician within the last 2 weeks? ---No Does the PT have any chronic conditions? (i.e. diabetes, asthma, this includes High risk factors for pregnancy, etc.) ---Yes List chronic conditions. ---CHF Is this a behavioral health or substance abuse call? ---No PLEASE NOTE: All timestamps contained within this report are  represented as [place] Standard Time. CONFIDENTIALTY NOTICE: This fax transmission is intended only for the addressee. It contains information that is legally privileged, confidential or otherwise protected from use or disclosure. If you are not the intended recipient, you are strictly prohibited from reviewing, disclosing, copying using or disseminating any of this information or taking any action in reliance on or regarding this information. If you have received this fax in error, please notify us immediately by telephone so that we can arrange for its return to [place]. Phone: [phone], Toll-Free: [phone], Fax: [phone] Page: 2 of 2 Call Id: [id number] Guidelines Guideline Title Affirmed Question Affirmed Notes Nurse Date/Time Eilene Ghazi Time) Cough - Chronic Change in color of sputum (e.g., from white to yellow-green sputum) Lovelace, RN, Amy 09/16/2022 9:12:11 AM Disp. Time Eilene Ghazi Time) Disposition Final User 09/16/2022 8:45:56 AM Send To Nurse Brantley Persons, RN, Kaila 09/16/2022 9:19:13 AM See PCP within 24 Hours Yes Lovelace, RN, Amy Final Disposition 09/16/2022 9:19:13 AM See PCP within 24 Hours Yes Lovelace, RN, Amy Caller Disagree/Comply Comply Caller Understands Yes PreDisposition InappropriateToAsk Care Advice Given Per Guideline SEE PCP WITHIN 24 HOURS: * IF OFFICE WILL BE OPEN: You need to be examined within the next 24 hours. Call your doctor (or NP/PA) when the office opens and make an appointment. COUGHING SPELLS: * Drink warm fluids. Inhale warm mist. This can help relax the airway and also loosen up phlegm. HUMIDIFIER: CALL BACK IF: * Difficulty breathing occurs * You become worse CARE ADVICE given per Cough - Chronic (Adult) guideline. Referrals REFERRED TO PCP OFFICE

## 2022-09-17 NOTE — Telephone Encounter (Signed)
Called and spoke to Ms Krogh.  Discussed recent cxr.  Discussed continued cough and congestion.  Able to talk ad answer questions without increased sob or increased coughing.  Desires to stop doxycycline and start zpak.  Confirm has taken zpak and tolerated with no adverse reactions.  Zpak sent in to pharmacy.  Given persistent pulmonary issues, discussed f/u with pulmonary for further evaluation and treatment.  Also discussed f/u with cardiology given mild CHF noted on cxr.

## 2022-09-17 NOTE — Telephone Encounter (Signed)
Pt decided to cancel appt w/ Kacy for 1/17 due to having zpak sent in

## 2022-09-17 NOTE — Telephone Encounter (Signed)
Called and spoke to Kimberly Roy.  See previous note.  Needs f/u appt with Clabe Seal [facility].  (CXR with mild CHF).  Adjusting lasix dosing, etc.  Would like f/u appt with cardiology.  Please call and schedule.

## 2022-09-18 ENCOUNTER — Ambulatory Visit: Payer: Medicare HMO | Admitting: Nurse Practitioner

## 2022-09-18 DIAGNOSIS — I272 Pulmonary hypertension, unspecified: Secondary | ICD-10-CM | POA: Diagnosis not present

## 2022-09-18 DIAGNOSIS — J441 Chronic obstructive pulmonary disease with (acute) exacerbation: Secondary | ICD-10-CM | POA: Diagnosis not present

## 2022-09-18 DIAGNOSIS — I10 Essential (primary) hypertension: Secondary | ICD-10-CM | POA: Diagnosis not present

## 2022-09-18 DIAGNOSIS — I890 Lymphedema, not elsewhere classified: Secondary | ICD-10-CM | POA: Diagnosis not present

## 2022-09-18 DIAGNOSIS — Z9889 Other specified postprocedural states: Secondary | ICD-10-CM | POA: Diagnosis not present

## 2022-09-18 DIAGNOSIS — I350 Nonrheumatic aortic (valve) stenosis: Secondary | ICD-10-CM | POA: Diagnosis not present

## 2022-09-18 DIAGNOSIS — I5032 Chronic diastolic (congestive) heart failure: Secondary | ICD-10-CM | POA: Diagnosis not present

## 2022-09-18 DIAGNOSIS — I2729 Other secondary pulmonary hypertension: Secondary | ICD-10-CM | POA: Diagnosis not present

## 2022-09-18 DIAGNOSIS — R001 Bradycardia, unspecified: Secondary | ICD-10-CM | POA: Diagnosis not present

## 2022-09-18 DIAGNOSIS — E7800 Pure hypercholesterolemia, unspecified: Secondary | ICD-10-CM | POA: Diagnosis not present

## 2022-09-18 NOTE — Telephone Encounter (Signed)
Called Cardio to schedule pt - Cancellation today at 1130 with Clabe Seal. Pt advised, will try to get transportation and call cardio if can not make it.  If pt cannot make it she will be added to wait list and kept on for scheduled appt 2/27. Faxing over CXR at request of Nurse to [phone]

## 2022-09-30 ENCOUNTER — Telehealth: Payer: Self-pay

## 2022-09-30 DIAGNOSIS — F419 Anxiety disorder, unspecified: Secondary | ICD-10-CM | POA: Diagnosis not present

## 2022-09-30 DIAGNOSIS — F330 Major depressive disorder, recurrent, mild: Secondary | ICD-10-CM | POA: Diagnosis not present

## 2022-09-30 DIAGNOSIS — F5105 Insomnia due to other mental disorder: Secondary | ICD-10-CM | POA: Diagnosis not present

## 2022-09-30 NOTE — Telephone Encounter (Signed)
Spoke to patient and confirmed that she wished to switch to [place] office, as it is closer to her home.

## 2022-10-01 ENCOUNTER — Institutional Professional Consult (permissible substitution): Payer: Medicare HMO | Admitting: Student in an Organized Health Care Education/Training Program

## 2022-10-04 ENCOUNTER — Other Ambulatory Visit: Payer: Self-pay | Admitting: Internal Medicine

## 2022-10-07 DIAGNOSIS — M25551 Pain in right hip: Secondary | ICD-10-CM | POA: Diagnosis not present

## 2022-10-07 DIAGNOSIS — M25561 Pain in right knee: Secondary | ICD-10-CM | POA: Diagnosis not present

## 2022-10-08 ENCOUNTER — Inpatient Hospital Stay: Payer: Medicare HMO | Attending: Internal Medicine

## 2022-10-08 ENCOUNTER — Inpatient Hospital Stay (HOSPITAL_BASED_OUTPATIENT_CLINIC_OR_DEPARTMENT_OTHER): Payer: Medicare HMO | Admitting: Internal Medicine

## 2022-10-08 VITALS — BP 131/74 | HR 85 | Temp 97.0°F | Resp 16 | Wt 169.8 lb

## 2022-10-08 DIAGNOSIS — R5383 Other fatigue: Secondary | ICD-10-CM | POA: Diagnosis not present

## 2022-10-08 DIAGNOSIS — Z881 Allergy status to other antibiotic agents status: Secondary | ICD-10-CM | POA: Diagnosis not present

## 2022-10-08 DIAGNOSIS — Z8249 Family history of ischemic heart disease and other diseases of the circulatory system: Secondary | ICD-10-CM | POA: Diagnosis not present

## 2022-10-08 DIAGNOSIS — E538 Deficiency of other specified B group vitamins: Secondary | ICD-10-CM | POA: Insufficient documentation

## 2022-10-08 DIAGNOSIS — Z882 Allergy status to sulfonamides status: Secondary | ICD-10-CM | POA: Diagnosis not present

## 2022-10-08 DIAGNOSIS — Z9049 Acquired absence of other specified parts of digestive tract: Secondary | ICD-10-CM | POA: Insufficient documentation

## 2022-10-08 DIAGNOSIS — D696 Thrombocytopenia, unspecified: Secondary | ICD-10-CM | POA: Diagnosis not present

## 2022-10-08 DIAGNOSIS — M1990 Unspecified osteoarthritis, unspecified site: Secondary | ICD-10-CM | POA: Insufficient documentation

## 2022-10-08 DIAGNOSIS — Z8719 Personal history of other diseases of the digestive system: Secondary | ICD-10-CM | POA: Diagnosis not present

## 2022-10-08 DIAGNOSIS — Z86718 Personal history of other venous thrombosis and embolism: Secondary | ICD-10-CM | POA: Insufficient documentation

## 2022-10-08 DIAGNOSIS — I509 Heart failure, unspecified: Secondary | ICD-10-CM | POA: Insufficient documentation

## 2022-10-08 DIAGNOSIS — M2550 Pain in unspecified joint: Secondary | ICD-10-CM | POA: Diagnosis not present

## 2022-10-08 DIAGNOSIS — G4733 Obstructive sleep apnea (adult) (pediatric): Secondary | ICD-10-CM | POA: Insufficient documentation

## 2022-10-08 DIAGNOSIS — R053 Chronic cough: Secondary | ICD-10-CM | POA: Insufficient documentation

## 2022-10-08 DIAGNOSIS — R062 Wheezing: Secondary | ICD-10-CM | POA: Insufficient documentation

## 2022-10-08 DIAGNOSIS — D649 Anemia, unspecified: Secondary | ICD-10-CM

## 2022-10-08 DIAGNOSIS — I110 Hypertensive heart disease with heart failure: Secondary | ICD-10-CM | POA: Diagnosis not present

## 2022-10-08 DIAGNOSIS — Z888 Allergy status to other drugs, medicaments and biological substances status: Secondary | ICD-10-CM | POA: Diagnosis not present

## 2022-10-08 DIAGNOSIS — E039 Hypothyroidism, unspecified: Secondary | ICD-10-CM | POA: Insufficient documentation

## 2022-10-08 DIAGNOSIS — I272 Pulmonary hypertension, unspecified: Secondary | ICD-10-CM | POA: Insufficient documentation

## 2022-10-08 DIAGNOSIS — Z823 Family history of stroke: Secondary | ICD-10-CM | POA: Insufficient documentation

## 2022-10-08 DIAGNOSIS — Z90710 Acquired absence of both cervix and uterus: Secondary | ICD-10-CM | POA: Diagnosis not present

## 2022-10-08 DIAGNOSIS — Z79899 Other long term (current) drug therapy: Secondary | ICD-10-CM | POA: Diagnosis not present

## 2022-10-08 DIAGNOSIS — M549 Dorsalgia, unspecified: Secondary | ICD-10-CM | POA: Diagnosis not present

## 2022-10-08 DIAGNOSIS — D539 Nutritional anemia, unspecified: Secondary | ICD-10-CM | POA: Diagnosis not present

## 2022-10-08 LAB — BASIC METABOLIC PANEL
Anion gap: 10 (ref 5–15)
BUN: 18 mg/dL (ref 8–23)
CO2: 28 mmol/L (ref 22–32)
Calcium: 9.6 mg/dL (ref 8.9–10.3)
Chloride: 101 mmol/L (ref 98–111)
Creatinine, Ser: 0.96 mg/dL (ref 0.44–1.00)
GFR, Estimated: 57 mL/min — ABNORMAL LOW (ref >60)
Glucose, Bld: 129 mg/dL — ABNORMAL HIGH (ref 70–99)
Potassium: 4.3 mmol/L (ref 3.5–5.1)
Sodium: 139 mmol/L (ref 135–145)

## 2022-10-08 LAB — VITAMIN B12: Vitamin B-12: 744 pg/mL (ref 180–914)

## 2022-10-08 LAB — CBC WITH DIFFERENTIAL/PLATELET
Abs Immature Granulocytes: 0.02 10*3/uL (ref 0.00–0.07)
Basophils Absolute: 0 10*3/uL (ref 0.0–0.1)
Basophils Relative: 0 %
Eosinophils Absolute: 0.2 10*3/uL (ref 0.0–0.5)
Eosinophils Relative: 3 %
HCT: 33.4 % — ABNORMAL LOW (ref 36.0–46.0)
Hemoglobin: 10.7 g/dL — ABNORMAL LOW (ref 12.0–15.0)
Immature Granulocytes: 0 %
Lymphocytes Relative: 13 %
Lymphs Abs: 0.8 10*3/uL (ref 0.7–4.0)
MCH: 32.6 pg (ref 26.0–34.0)
MCHC: 32 g/dL (ref 30.0–36.0)
MCV: 101.8 fL — ABNORMAL HIGH (ref 80.0–100.0)
Monocytes Absolute: 0.2 10*3/uL (ref 0.1–1.0)
Monocytes Relative: 3 %
Neutro Abs: 5.1 10*3/uL (ref 1.7–7.7)
Neutrophils Relative %: 81 %
Platelets: 208 10*3/uL (ref 150–400)
RBC: 3.28 MIL/uL — ABNORMAL LOW (ref 3.87–5.11)
RDW: 13.7 % (ref 11.5–15.5)
WBC: 6.3 10*3/uL (ref 4.0–10.5)
nRBC: 0 % (ref 0.0–0.2)

## 2022-10-08 LAB — IRON AND TIBC
Iron: 63 ug/dL (ref 28–170)
Saturation Ratios: 25 % (ref 10.4–31.8)
TIBC: 258 ug/dL (ref 250–450)
UIBC: 195 ug/dL

## 2022-10-08 LAB — FERRITIN: Ferritin: 159 ng/mL (ref 11–307)

## 2022-10-08 NOTE — Progress Notes (Signed)
Patient had 2 hospitalizations since last MD visit.  Ortho started Pednisone taper pak today.

## 2022-10-08 NOTE — Assessment & Plan Note (Addendum)
#    Chronic macrocytic anemia-unclear etiology-hemoglobin around 10-mild thrombocytopenia greater than 100 platelets- suspect multifactorial. BONE MARROW- June 2023 all the peripheral blood shows mild macrocytic anemia-bone marrow biopsy shows Overall normocellular bone marrow with orderly trilineage  hematopoiesis,-  Essentially absent histiocytic iron stores; conventional cytogenetics normal.  # B12 def-[ B12 [July 2023]- 156-currently getting B12 injections at home every 3 months.   Care giver-[place]  # DISPOSITION:  # follow up in 4 months- MD; labs-cbc/bmp; iron studies;ferritin- B12 levels -Dr.B

## 2022-10-08 NOTE — Progress Notes (Signed)
[place] NOTE  Patient Care Team: Einar Pheasant, [name], [name], [name], [name], MD as Consulting Physician (Hematology and Oncology)  CHIEF COMPLAINTS/PURPOSE OF CONSULTATION: Anemia  # CHRONIC MILD ANEMIA hb ~11.[Previous Dr.Gittin pt]; [place] 2017- work up  [place]; stool card; Iron/ monoclonal work up- NEGATIVE] ; MARCH 2022- B12 WNL. BONE MARROW BIOPSY-JUNE 2023- DIAGNOSIS:   BONE MARROW, ASPIRATE, CLOT, CORE:  -Overall normocellular bone marrow with orderly trilineage  hematopoiesis, see note.  -  Essentially absent histiocytic iron stores   PERIPHERAL BLOOD:  -Macrocytic anemia and thrombocytopenia.   NOTE:  While the biopsy is inadequate for interpretation the overall aspirate  and clot section are adequate and revealed an otherwise normocellular  bone marrow.  While there is orderly maturation and unremarkable  morphology to the myeloid and erythroid lineages a compensatory  erythroid hyperplasia with left shift is not definitively identified.  There is a mild megakaryocytic hyperplasia with some atypical forms;  however, this may simply be a compensatory megakaryocytic hyperplasia  with left shift.  There is no definitive dysplasia in any of the 3  lineages.  Given the borderline plasmacytosis by aspirate morphology  immunohistochemical stains were performed identifying a normal number of  polyclonal plasma cells.   It should be noted that while my indices did anemia is macrocytic there  is essentially absent histiocytic iron stores.  A morphologic etiology  for the patient's anemia and thrombocytopenia is not identified.  Correlation with pending conventional cytogenetics is recommended to  fully exclude a possible myelodysplastic syndrome.  If clinically  indicated additional molecular  testing such as a next generation myeloid  panel could be considered.   # CHRONIC INTERSTITIAL CYSTITIS/ wheel chair bound  Oncology History   No history exists.    HISTORY OF PRESENTING ILLNESS: Alone.  In wheel chair.  Patient's daughter [name] on the phone.  [name] 89 y.o.  female  with a history of chronic macrocytic anemia- > 10 years of unclear etiology and mild thrombocytopenia; mild B12 deficiency is here for follow-up.  Patient had 2 hospitalizations since last MD visit.  The last admission was for hypoxia/flu.  Patient here today for follow up regarding anemia. Patient denies concerns today.  Patient currently on gentle iron.  Denies any worsening constipation.  Complains of ongoing fatigue.  This is not any worse.  Patient getting B12 injections at home every 3 months.  Review of Systems  Constitutional:  Positive for malaise/fatigue. Negative for chills, diaphoresis and fever.  HENT:  Negative for nosebleeds and sore throat.   Eyes:  Negative for double vision.  Respiratory:  Negative for cough, hemoptysis, sputum production, shortness of breath and wheezing.   Cardiovascular:  Negative for chest pain, palpitations, orthopnea and leg swelling.  Gastrointestinal:  Negative for abdominal pain, blood in stool, constipation, diarrhea, heartburn, melena, nausea and vomiting.  Genitourinary:  Negative for dysuria, frequency and urgency.  Musculoskeletal:  Positive for back pain and joint pain.  Skin: Negative.  Negative for itching and rash.  Neurological:  Negative for dizziness, tingling, focal weakness, weakness and headaches.  Endo/Heme/Allergies:  Does not bruise/bleed easily.  Psychiatric/Behavioral:  Negative for depression. The patient is not nervous/anxious and does not have insomnia.      MEDICAL HISTORY:  Past Medical History:  Diagnosis Date   Anemia    Anxiety    Chest pain  CHF (congestive heart failure) (HCC)    Constipation    DDD (degenerative  disc disease), cervical    Depression    DVT (deep venous thrombosis) (HCC)    Dysphonia    Dyspnea    Fatty liver    Fatty liver    Headache    Hyperlipidemia    Hyperpiesia    Hypertension    Hypothyroidism    Interstitial cystitis    Left ventricular dysfunction    Lymphedema    Nephrolithiasis    Obstructive sleep apnea    Osteoarthritis    knees/cervical and lumbar spine   Pulmonary hypertension (HCC)    Pulmonary nodules    followed by Dr Raul Del   Pure hypercholesterolemia    Renal cyst    right    SURGICAL HISTORY: Past Surgical History:  Procedure Laterality Date   ABDOMINAL HYSTERECTOMY     ovaries left in place   APPENDECTOMY     Back Surgeries     BACK SURGERY     BREAST REDUCTION SURGERY     3/99   CARDIAC CATHETERIZATION     cataracts Bilateral    CERVICAL SPINE SURGERY     ESOPHAGEAL MANOMETRY N/A 08/02/2015   Procedure: ESOPHAGEAL MANOMETRY (EM);  Surgeon: Josefine Class, MD;  Location: [facility] ENDOSCOPY;  Service: Endoscopy;  Laterality: N/A;   ESOPHAGOGASTRODUODENOSCOPY N/A 02/27/2015   Procedure: ESOPHAGOGASTRODUODENOSCOPY (EGD);  Surgeon: Hulen Luster, MD;  Location: [facility] ENDOSCOPY;  Service: Gastroenterology;  Laterality: N/A;   ESOPHAGOGASTRODUODENOSCOPY (EGD) WITH PROPOFOL N/A 10/30/2018   Procedure: ESOPHAGOGASTRODUODENOSCOPY (EGD) WITH PROPOFOL;  Surgeon: Lollie Sails, MD;  Location: [facility] ENDOSCOPY;  Service: Endoscopy;  Laterality: N/A;   EXCISIONAL HEMORRHOIDECTOMY     EYE SURGERY     FRACTURE SURGERY     HEMORRHOID SURGERY     HIP SURGERY  2013   Right hip surgery   JOINT REPLACEMENT     KNEE ARTHROSCOPY     left and right   ORIF FEMUR FRACTURE Right 08/07/2018   Procedure: OPEN REDUCTION INTERNAL FIXATION (ORIF) DISTAL FEMUR FRACTURE;  Surgeon: Hessie Knows, MD;  Location: [facility] ORS;  Service: Orthopedics;  Laterality: Right;   REDUCTION MAMMAPLASTY Bilateral YRS AGO   REPLACEMENT TOTAL KNEE Bilateral    rotator cuff surgery      blilateral   TONSILECTOMY/ADENOIDECTOMY WITH MYRINGOTOMY     VISCERAL ARTERY INTERVENTION N/A 02/25/2019   Procedure: VISCERAL ARTERY INTERVENTION;  Surgeon: Algernon Huxley, MD;  Location: [facility];  Service: Cardiovascular;  Laterality: N/A;    SOCIAL HISTORY: Social History   Socioeconomic History   Marital status: Widowed    Spouse name: Not on file   Number of children: 2   Years of education: 35   Highest education level: 12th grade  Occupational History    Comment: insurance agency  Tobacco Use   Smoking status: Never   Smokeless tobacco: Never  Vaping Use   Vaping Use: Never used  Substance and Sexual Activity   Alcohol use: No    Alcohol/week: 0.0 standard drinks of alcohol   Drug use: Never   Sexual activity: Not Currently  Other Topics Concern   Not on file  Social History Narrative   Not on file   Social Determinants of Health   Financial Resource Strain: Low Risk  (03/09/2021)   Overall Financial Resource Strain (CARDIA)    Difficulty of Paying Living Expenses: Not hard at all  Food Insecurity: No Food Insecurity (09/05/2022)  Hunger Vital Sign    Worried About Running Out of Food in the Last Year: Never true    Ran Out of Food in the Last Year: Never true  Transportation Needs: Unmet Transportation Needs (09/05/2022)   PRAPARE - Transportation    Lack of Transportation (Medical): Yes    Lack of Transportation (Non-Medical): Yes  Physical Activity: Inactive (03/09/2021)   Exercise Vital Sign    Days of Exercise per Week: 0 days    Minutes of Exercise per Session: 0 min  Stress: No Stress Concern Present (03/09/2021)   [place]    Feeling of Stress : Only a little  Social Connections: Socially Isolated (11/15/2021)   Social Connection and Isolation Panel [NHANES]    Frequency of Communication with Friends and Family: Three times a week    Frequency of Social Gatherings with Friends  and Family: Twice a week    Attends Religious Services: Never    Marine scientist or Organizations: No    Attends Archivist Meetings: Never    Marital Status: Widowed  Intimate Partner Violence: Not At Risk (07/12/2022)   Humiliation, Afraid, Rape, and Kick questionnaire    Fear of Current or Ex-Partner: No    Emotionally Abused: No    Physically Abused: No    Sexually Abused: No    FAMILY HISTORY: Family History  Problem Relation Age of Onset   Heart disease Mother    Stroke Mother    Hypertension Mother    Heart disease Father        myocardial infarction age 87   Breast cancer Neg Hx     ALLERGIES:  is allergic to lyrica [pregabalin], omnicef [cefdinir], atarax [hydroxyzine], dicyclomine, hydroxyzine hcl, levaquin [levofloxacin], nitrofurantoin, nucynta er [tapentadol hcl er], oxybutynin, zoloft [sertraline hcl], biaxin [clarithromycin], meloxicam, sertraline, silicone, sulfa antibiotics, sulfasalazine, tape, tapentadol, and venofer [iron sucrose].  MEDICATIONS:  Current Outpatient Medications  Medication Sig Dispense Refill   albuterol (VENTOLIN HFA) 108 (90 Base) MCG/ACT inhaler INHALE 2 PUFFS INTO THE LUNGS EVERY 6 HOURS AS NEEDED FOR WHEEZING OR SHORTNESS OF BREATH 18 g 0   aluminum-magnesium hydroxide-simethicone (MAALOX) 295-188-41 MG/5ML SUSP Take 15 mLs by mouth 4 (four) times daily -  before meals and at bedtime.      amLODipine (NORVASC) 10 MG tablet TAKE 1 TABLET BY MOUTH DAILY 90 tablet 1   aspirin 81 MG EC tablet Take 81 mg by mouth daily. Every other day     atorvastatin (LIPITOR) 20 MG tablet TAKE 1 TABLET BY MOUTH AT BEDTIME 90 tablet 1   benazepril (LOTENSIN) 40 MG tablet TAKE 1 TABLET BY MOUTH DAILY 90 tablet 1   betamethasone dipropionate 0.05 % cream Apply 1 application topically 2 (two) times daily.     busPIRone (BUSPAR) 10 MG tablet Take 10 mg by mouth 3 (three) times daily.     calcium citrate-vitamin D (CITRACAL+D) 315-200 MG-UNIT  tablet Take 2 tablets by mouth daily.     Cholecalciferol (VITAMIN D3) 1000 UNITS CAPS Take 1 capsule by mouth daily.     clobetasol cream (TEMOVATE) 6.60 % Apply 1 application topically See admin instructions. Apply to affected areas of body 1 - 2 times daily as needed for itchy bumps. Avoid applying to face, groin, and axilla. Use as directed. 60 g 1   cyanocobalamin (VITAMIN B12) 1000 MCG/ML injection Inject 1 mL (1,000 mcg total) into the muscle every 3 (three) months. 12 mL  0   Diclofenac Sodium 3 % GEL Apply topically 2 (two) times daily.     Docusate Sodium (DSS) 100 MG CAPS Take 100 mg by mouth 2 (two) times daily.     DULoxetine (CYMBALTA) 60 MG capsule Take 60 mg by mouth daily.     Fluocinolone Acetonide 0.01 % OIL Apply 1-2 drops into ears once to twice daily as needed. 20 mL 3   fluocinonide (LIDEX) 0.05 % external solution Apply topically 2 (two) times daily. Apply drops to scalp for itch 60 mL 3   fluticasone (FLONASE) 50 MCG/ACT nasal spray Place 2 sprays into both nostrils daily. 16 g 6   furosemide (LASIX) 20 MG tablet TAKE 1 TABLET BY MOUTH DAILY AS NEEDED 90 tablet 0   gabapentin (NEURONTIN) 300 MG capsule Take two tablets tid (in am, midday and q hs). 540 capsule 1   HYDROcodone-acetaminophen (NORCO) 10-325 MG tablet Take 1 tablet by mouth 3 (three) times daily. 10 tablet 0   Infant Care Products [facility]) OINT Apply topically as directed (as discussed). 430 g 1   ipratropium (ATROVENT) 0.03 % nasal spray Place 2 sprays into both nostrils every 12 (twelve) hours. 30 mL 1   ketoconazole (NIZORAL) 2 % shampoo Apply 1 Application topically See admin instructions. apply three times per week, massage into scalp and leave in for 10 minutes before rinsing out 120 mL 3   levalbuterol (XOPENEX) 0.63 MG/3ML nebulizer solution Take 3 mLs (0.63 mg total) by nebulization every 6 (six) hours as needed for wheezing or shortness of breath. 3 mL 1   LINZESS 145 MCG CAPS capsule Take 145 mcg  by mouth daily.     magnesium oxide (MAG-OX) 400 MG tablet Take 1 tablet (400 mg total) by mouth daily. 30 tablet 1   methylPREDNISolone (MEDROL DOSEPAK) 4 MG TBPK tablet Take 4 mg by mouth. Taper pak     metoprolol succinate (TOPROL-XL) 50 MG 24 hr tablet TAKE 1 TABLET BY MOUTH DAILY. TAKE WITH OR IMMEDIATELY FOLLOWING A MEAL. 90 tablet 1   morphine (MS CONTIN) 30 MG 12 hr tablet Take 30 mg by mouth every 12 (twelve) hours.     morphine (MS CONTIN) 60 MG 12 hr tablet Take 1 tablet (60 mg total) by mouth 2 (two) times daily. 10 tablet 0   mupirocin ointment (BACTROBAN) 2 % Apply 1 Application topically 2 (two) times daily. 22 g 0   Naldemedine Tosylate 0.2 MG TABS Take 0.2 mg by mouth daily.     nystatin (MYCOSTATIN/NYSTOP) powder Apply 1 application. topically 2 (two) times daily as needed. 60 g 0   nystatin cream (MYCOSTATIN) Apply 1 Application topically 2 (two) times daily. 60 g 0   polyethylene glycol powder (GLYCOLAX/MIRALAX) 17 GM/SCOOP powder MIX 17 GRAMS AS MARKED ON BOTTLE TOP IN 8 OUNCES OF WATER AND DRINK ONCE A DAY AS DIRECTED. 527 g 0   RABEprazole (ACIPHEX) 20 MG tablet TAKE 1 TABLET BY MOUTH TWICE A DAY BEFORE A MEAL 180 tablet 1   senna (SENOKOT) 8.6 MG TABS tablet Take 1 tablet by mouth 2 (two) times daily.     Simethicone (GAS-X PO) Take 2 tablets by mouth 2 (two) times daily.     sodium chloride (OCEAN) 0.65 % nasal spray Place 1 spray into the nose as needed.     SYNTHROID 100 MCG tablet Take 1 tablet (100 mcg total) by mouth daily. 90 tablet 3   triamcinolone ointment (KENALOG) 0.1 % APPLY TWICE DAILY TO  BITES AND RASH UNTIL FLAT AND SMOOTH **DO NOT APPLY TO FACE** 80 g 0   doxycycline (VIBRA-TABS) 100 MG tablet Take 1 tablet (100 mg total) by mouth 2 (two) times daily. (Patient not taking: Reported on 10/08/2022) 20 tablet 0   predniSONE (DELTASONE) 10 MG tablet Take 4 tablets x 1 day and then decrease by 1/2 tablet per day until down to zero mg. (Patient not taking: Reported  on 10/08/2022) 18 tablet 0   No current facility-administered medications for this visit.      Marland Kitchen  PHYSICAL EXAMINATION: ECOG PERFORMANCE STATUS: 0 - Asymptomatic  Vitals:   10/08/22 1500  BP: 131/74  Pulse: 85  Resp: 16  Temp: (!) 97 F (36.1 C)   Filed Weights   10/08/22 1500  Weight: 169 lb 12.8 oz (77 kg)     Physical Exam Constitutional:      Comments: Patient in wheelchair because of chronic arthritic problems.  HENT:     Head: Normocephalic and atraumatic.     Mouth/Throat:     Pharynx: No oropharyngeal exudate.  Eyes:     Pupils: Pupils are equal, round, and reactive to light.  Cardiovascular:     Rate and Rhythm: Normal rate and regular rhythm.  Pulmonary:     Effort: Pulmonary effort is normal. No respiratory distress.     Breath sounds: Normal breath sounds. No wheezing.  Abdominal:     General: Bowel sounds are normal. There is no distension.     Palpations: Abdomen is soft. There is no mass.     Tenderness: There is no abdominal tenderness. There is no guarding or rebound.  Musculoskeletal:        General: No tenderness. Normal range of motion.     Cervical back: Normal range of motion and neck supple.  Skin:    General: Skin is warm.  Neurological:     Mental Status: She is alert and oriented to person, place, and time.  Psychiatric:        Mood and Affect: Affect normal.     LABORATORY DATA:  I have reviewed the data as listed Lab Results  Component Value Date   WBC 6.3 10/08/2022   HGB 10.7 (L) 10/08/2022   HCT 33.4 (L) 10/08/2022   MCV 101.8 (H) 10/08/2022   PLT 208 10/08/2022   Recent Labs    01/21/22 1200 01/23/22 1256 07/07/22 1020 07/11/22 1558 07/13/22 0436 07/14/22 0311 07/15/22 0414 07/19/22 1203 10/08/22 1509  NA 140   < > 138 139   < > 140 138 140 139  K 4.7   < > 3.3* 3.7   < > 3.9 4.0 5.2 No hemolysis seen* 4.3  CL 101   < > 103 99   < > 103 99 100 101  CO2 33*   < > 28 33*   < > 34* 34* 33* 28  GLUCOSE 84   < >  131* 209*   < > 85 106* 92 129*  BUN 23   < > 18 20   < > 23 30* 21 18  CREATININE 1.02   < > 0.70 0.70   < > 0.80 0.94 0.95 0.96  CALCIUM 10.0   < > 9.1 9.3   < > 8.5* 9.2 9.7 9.6  GFRNONAA  --    < > >60 >60   < > >60 58*  --  57*  PROT 6.4  --  6.8 6.8  --   --   --   --   --  ALBUMIN 4.3  --  3.7 3.7  --   --   --   --   --   AST 15  --  30 25  --   --   --   --   --   ALT 13  --  12 18  --   --   --   --   --   ALKPHOS 96  --  68 70  --   --   --   --   --   BILITOT 0.4  --  0.9 0.6  --   --   --   --   --   BILIDIR 0.1  --   --   --   --   --   --   --   --    < > = values in this interval not displayed.    RADIOGRAPHIC STUDIES: I have personally reviewed the radiological images as listed and agreed with the findings in the report. DG Chest 2 View  Result Date: 09/13/2022 CLINICAL DATA:  Persistent cough, congestion and wheezing. EXAM: CHEST - 2 VIEW COMPARISON:  July 11, 2022 FINDINGS: The heart size and mediastinal contours are stable. The heart size is enlarged. The aorta is tortuous. Mild increased pulmonary interstitium is identified bilaterally. There is no focal pneumonia or pleural effusion. The visualized skeletal structures are stable. Spinal stimulator leads are noted. IMPRESSION: Mild congestive heart failure. Electronically Signed   By: Abelardo Diesel M.D.   On: 09/13/2022 10:42    ASSESSMENT & PLAN:   Symptomatic anemia #  Chronic macrocytic anemia-unclear etiology-hemoglobin around 10-mild thrombocytopenia greater than 100 platelets- suspect multifactorial. BONE MARROW- June 2023 all the peripheral blood shows mild macrocytic anemia-bone marrow biopsy shows Overall normocellular bone marrow with orderly trilineage  hematopoiesis,-  Essentially absent histiocytic iron stores; conventional cytogenetics normal.  # B12 def-[ B12 [July 2023]- 156-currently getting B12 injections at home every 3 months.   Care giver-[place]  # DISPOSITION:  # follow up in 4 months-  MD; labs-cbc/bmp; iron studies;ferritin- B12 levels -Dr.B   Cammie Sickle, MD 10/08/2022 3:49 PM

## 2022-10-09 ENCOUNTER — Institutional Professional Consult (permissible substitution): Payer: Medicare HMO | Admitting: Student in an Organized Health Care Education/Training Program

## 2022-10-09 DIAGNOSIS — J9621 Acute and chronic respiratory failure with hypoxia: Secondary | ICD-10-CM | POA: Diagnosis not present

## 2022-10-09 DIAGNOSIS — I700 Atherosclerosis of aorta: Secondary | ICD-10-CM

## 2022-10-09 DIAGNOSIS — M1610 Unilateral primary osteoarthritis, unspecified hip: Secondary | ICD-10-CM

## 2022-10-09 DIAGNOSIS — I2510 Atherosclerotic heart disease of native coronary artery without angina pectoris: Secondary | ICD-10-CM | POA: Diagnosis not present

## 2022-10-09 DIAGNOSIS — K219 Gastro-esophageal reflux disease without esophagitis: Secondary | ICD-10-CM

## 2022-10-09 DIAGNOSIS — M19019 Primary osteoarthritis, unspecified shoulder: Secondary | ICD-10-CM

## 2022-10-09 DIAGNOSIS — M5137 Other intervertebral disc degeneration, lumbosacral region: Secondary | ICD-10-CM

## 2022-10-09 DIAGNOSIS — I272 Pulmonary hypertension, unspecified: Secondary | ICD-10-CM

## 2022-10-09 DIAGNOSIS — D696 Thrombocytopenia, unspecified: Secondary | ICD-10-CM

## 2022-10-09 DIAGNOSIS — J449 Chronic obstructive pulmonary disease, unspecified: Secondary | ICD-10-CM | POA: Diagnosis not present

## 2022-10-09 DIAGNOSIS — G43909 Migraine, unspecified, not intractable, without status migrainosus: Secondary | ICD-10-CM

## 2022-10-09 DIAGNOSIS — I5033 Acute on chronic diastolic (congestive) heart failure: Secondary | ICD-10-CM | POA: Diagnosis not present

## 2022-10-09 DIAGNOSIS — Z9981 Dependence on supplemental oxygen: Secondary | ICD-10-CM

## 2022-10-09 DIAGNOSIS — G8929 Other chronic pain: Secondary | ICD-10-CM

## 2022-10-09 DIAGNOSIS — Z86718 Personal history of other venous thrombosis and embolism: Secondary | ICD-10-CM

## 2022-10-09 DIAGNOSIS — F32A Depression, unspecified: Secondary | ICD-10-CM

## 2022-10-09 DIAGNOSIS — I110 Hypertensive heart disease with heart failure: Secondary | ICD-10-CM | POA: Diagnosis not present

## 2022-10-09 DIAGNOSIS — E871 Hypo-osmolality and hyponatremia: Secondary | ICD-10-CM

## 2022-10-09 DIAGNOSIS — M961 Postlaminectomy syndrome, not elsewhere classified: Secondary | ICD-10-CM

## 2022-10-09 DIAGNOSIS — B961 Klebsiella pneumoniae [K. pneumoniae] as the cause of diseases classified elsewhere: Secondary | ICD-10-CM | POA: Diagnosis not present

## 2022-10-09 DIAGNOSIS — M5030 Other cervical disc degeneration, unspecified cervical region: Secondary | ICD-10-CM

## 2022-10-09 DIAGNOSIS — D649 Anemia, unspecified: Secondary | ICD-10-CM

## 2022-10-09 DIAGNOSIS — Z6831 Body mass index (BMI) 31.0-31.9, adult: Secondary | ICD-10-CM

## 2022-10-09 DIAGNOSIS — G4733 Obstructive sleep apnea (adult) (pediatric): Secondary | ICD-10-CM

## 2022-10-09 DIAGNOSIS — E669 Obesity, unspecified: Secondary | ICD-10-CM

## 2022-10-09 DIAGNOSIS — M419 Scoliosis, unspecified: Secondary | ICD-10-CM

## 2022-10-09 DIAGNOSIS — Z8701 Personal history of pneumonia (recurrent): Secondary | ICD-10-CM

## 2022-10-09 DIAGNOSIS — E538 Deficiency of other specified B group vitamins: Secondary | ICD-10-CM

## 2022-10-09 DIAGNOSIS — M48061 Spinal stenosis, lumbar region without neurogenic claudication: Secondary | ICD-10-CM

## 2022-10-09 DIAGNOSIS — E7800 Pure hypercholesterolemia, unspecified: Secondary | ICD-10-CM

## 2022-10-09 DIAGNOSIS — F0630 Mood disorder due to known physiological condition, unspecified: Secondary | ICD-10-CM

## 2022-10-09 DIAGNOSIS — J101 Influenza due to other identified influenza virus with other respiratory manifestations: Secondary | ICD-10-CM | POA: Diagnosis not present

## 2022-10-09 DIAGNOSIS — I872 Venous insufficiency (chronic) (peripheral): Secondary | ICD-10-CM | POA: Diagnosis not present

## 2022-10-09 DIAGNOSIS — F419 Anxiety disorder, unspecified: Secondary | ICD-10-CM

## 2022-10-09 DIAGNOSIS — Z79891 Long term (current) use of opiate analgesic: Secondary | ICD-10-CM

## 2022-10-09 DIAGNOSIS — K760 Fatty (change of) liver, not elsewhere classified: Secondary | ICD-10-CM

## 2022-10-09 DIAGNOSIS — E039 Hypothyroidism, unspecified: Secondary | ICD-10-CM

## 2022-10-09 DIAGNOSIS — N3000 Acute cystitis without hematuria: Secondary | ICD-10-CM | POA: Diagnosis not present

## 2022-10-09 DIAGNOSIS — I060 Rheumatic aortic stenosis: Secondary | ICD-10-CM

## 2022-10-09 DIAGNOSIS — Z7982 Long term (current) use of aspirin: Secondary | ICD-10-CM

## 2022-10-09 DIAGNOSIS — K2950 Unspecified chronic gastritis without bleeding: Secondary | ICD-10-CM

## 2022-10-09 DIAGNOSIS — E611 Iron deficiency: Secondary | ICD-10-CM

## 2022-10-10 ENCOUNTER — Encounter: Payer: Self-pay | Admitting: Internal Medicine

## 2022-10-10 DIAGNOSIS — M7061 Trochanteric bursitis, right hip: Secondary | ICD-10-CM | POA: Insufficient documentation

## 2022-10-11 ENCOUNTER — Ambulatory Visit: Payer: Medicare HMO | Admitting: Internal Medicine

## 2022-10-11 ENCOUNTER — Telehealth: Payer: Self-pay | Admitting: Internal Medicine

## 2022-10-11 NOTE — Telephone Encounter (Signed)
FYI for you. Patient has an appt with you next week (10/17/22)

## 2022-10-11 NOTE — Telephone Encounter (Signed)
Pt aware of below.

## 2022-10-11 NOTE — Telephone Encounter (Signed)
Kimberly Roy from [facility] called, [phone]. Calling to report bp; rt arm 162/68 and left arm 164/68. She is on prednisone taper 2 more days reaming, prescribed by Dr Harlow Mares at [facility], BP medication was taken around 10am.

## 2022-10-11 NOTE — Telephone Encounter (Signed)
Has previously been ok.   Question if prednisone contributing to elevation.  Have them recheck and spot check.  Let us know if persistent elevation.  If any acute symptoms, needs to be evaluated.  Keep appt

## 2022-10-12 ENCOUNTER — Telehealth: Payer: Medicare HMO | Admitting: Nurse Practitioner

## 2022-10-12 DIAGNOSIS — R509 Fever, unspecified: Secondary | ICD-10-CM

## 2022-10-12 DIAGNOSIS — R197 Diarrhea, unspecified: Secondary | ICD-10-CM | POA: Diagnosis not present

## 2022-10-12 DIAGNOSIS — R1084 Generalized abdominal pain: Secondary | ICD-10-CM | POA: Diagnosis not present

## 2022-10-12 NOTE — Patient Instructions (Signed)
Kimberly Roy, thank you for joining Chevis Pretty, FNP for today's virtual visit.  While this provider is not your primary care provider (PCP), if your PCP is located in our provider database this encounter information will be shared with them immediately following your visit.   [facility] account gives you access to today's visit and all your visits, tests, and labs performed at [facility] " click here if you don't have a [facility] account or go to [email or web address]  Consent: (Patient) Kimberly Roy provided verbal consent for this virtual visit at the beginning of the encounter.  Current Medications:  Current Outpatient Medications:    albuterol (VENTOLIN HFA) 108 (90 Base) MCG/ACT inhaler, INHALE 2 PUFFS INTO THE LUNGS EVERY 6 HOURS AS NEEDED FOR WHEEZING OR SHORTNESS OF BREATH, Disp: 18 g, Rfl: 0   aluminum-magnesium hydroxide-simethicone (MAALOX) I037812 MG/5ML SUSP, Take 15 mLs by mouth 4 (four) times daily -  before meals and at bedtime. , Disp: , Rfl:    amLODipine (NORVASC) 10 MG tablet, TAKE 1 TABLET BY MOUTH DAILY, Disp: 90 tablet, Rfl: 1   aspirin 81 MG EC tablet, Take 81 mg by mouth daily. Every other day, Disp: , Rfl:    atorvastatin (LIPITOR) 20 MG tablet, TAKE 1 TABLET BY MOUTH AT BEDTIME, Disp: 90 tablet, Rfl: 1   benazepril (LOTENSIN) 40 MG tablet, TAKE 1 TABLET BY MOUTH DAILY, Disp: 90 tablet, Rfl: 1   betamethasone dipropionate 0.05 % cream, Apply 1 application topically 2 (two) times daily., Disp: , Rfl:    busPIRone (BUSPAR) 10 MG tablet, Take 10 mg by mouth 3 (three) times daily., Disp: , Rfl:    calcium citrate-vitamin D (CITRACAL+D) 315-200 MG-UNIT tablet, Take 2 tablets by mouth daily., Disp: , Rfl:    Cholecalciferol (VITAMIN D3) 1000 UNITS CAPS, Take 1 capsule by mouth daily., Disp: , Rfl:    clobetasol cream (TEMOVATE) [id number] %, Apply 1 application topically See admin instructions. Apply to affected areas of body 1 - 2  times daily as needed for itchy bumps. Avoid applying to face, groin, and axilla. Use as directed., Disp: 60 g, Rfl: 1   cyanocobalamin (VITAMIN B12) 1000 MCG/ML injection, Inject 1 mL (1,000 mcg total) into the muscle every 3 (three) months., Disp: 12 mL, Rfl: 0   Diclofenac Sodium 3 % GEL, Apply topically 2 (two) times daily., Disp: , Rfl:    Docusate Sodium (DSS) 100 MG CAPS, Take 100 mg by mouth 2 (two) times daily., Disp: , Rfl:    doxycycline (VIBRA-TABS) 100 MG tablet, Take 1 tablet (100 mg total) by mouth 2 (two) times daily. (Patient not taking: Reported on 10/08/2022), Disp: 20 tablet, Rfl: 0   DULoxetine (CYMBALTA) 60 MG capsule, Take 60 mg by mouth daily., Disp: , Rfl:    Fluocinolone Acetonide 0.01 % OIL, Apply 1-2 drops into ears once to twice daily as needed., Disp: 20 mL, Rfl: 3   fluocinonide (LIDEX) 0.05 % external solution, Apply topically 2 (two) times daily. Apply drops to scalp for itch, Disp: 60 mL, Rfl: 3   fluticasone (FLONASE) 50 MCG/ACT nasal spray, Place 2 sprays into both nostrils daily., Disp: 16 g, Rfl: 6   furosemide (LASIX) 20 MG tablet, TAKE 1 TABLET BY MOUTH DAILY AS NEEDED, Disp: 90 tablet, Rfl: 0   gabapentin (NEURONTIN) 300 MG capsule, Take two tablets tid (in am, midday and q hs)., Disp: 540 capsule, Rfl: 1   HYDROcodone-acetaminophen (NORCO) 10-325 MG tablet, Take  1 tablet by mouth 3 (three) times daily., Disp: 10 tablet, Rfl: 0   Infant Care Products (DERMACLOUD) OINT, Apply topically as directed (as discussed)., Disp: 430 g, Rfl: 1   ipratropium (ATROVENT) 0.03 % nasal spray, Place 2 sprays into both nostrils every 12 (twelve) hours., Disp: 30 mL, Rfl: 1   ketoconazole (NIZORAL) 2 % shampoo, Apply 1 Application topically See admin instructions. apply three times per week, massage into scalp and leave in for 10 minutes before rinsing out, Disp: 120 mL, Rfl: 3   levalbuterol (XOPENEX) 0.63 MG/3ML nebulizer solution, Take 3 mLs (0.63 mg total) by nebulization every  6 (six) hours as needed for wheezing or shortness of breath., Disp: 3 mL, Rfl: 1   LINZESS 145 MCG CAPS capsule, Take 145 mcg by mouth daily., Disp: , Rfl:    magnesium oxide (MAG-OX) 400 MG tablet, Take 1 tablet (400 mg total) by mouth daily., Disp: 30 tablet, Rfl: 1   methylPREDNISolone (MEDROL DOSEPAK) 4 MG TBPK tablet, Take 4 mg by mouth. Taper pak, Disp: , Rfl:    metoprolol succinate (TOPROL-XL) 50 MG 24 hr tablet, TAKE 1 TABLET BY MOUTH DAILY. TAKE WITH OR IMMEDIATELY FOLLOWING A MEAL., Disp: 90 tablet, Rfl: 1   morphine (MS CONTIN) 30 MG 12 hr tablet, Take 30 mg by mouth every 12 (twelve) hours., Disp: , Rfl:    morphine (MS CONTIN) 60 MG 12 hr tablet, Take 1 tablet (60 mg total) by mouth 2 (two) times daily., Disp: 10 tablet, Rfl: 0   mupirocin ointment (BACTROBAN) 2 %, Apply 1 Application topically 2 (two) times daily., Disp: 22 g, Rfl: 0   Naldemedine Tosylate 0.2 MG TABS, Take 0.2 mg by mouth daily., Disp: , Rfl:    nystatin (MYCOSTATIN/NYSTOP) powder, Apply 1 application. topically 2 (two) times daily as needed., Disp: 60 g, Rfl: 0   nystatin cream (MYCOSTATIN), Apply 1 Application topically 2 (two) times daily., Disp: 60 g, Rfl: 0   polyethylene glycol powder (GLYCOLAX/MIRALAX) 17 GM/SCOOP powder, MIX 17 GRAMS AS MARKED ON BOTTLE TOP IN 8 OUNCES OF WATER AND DRINK ONCE A DAY AS DIRECTED., Disp: 527 g, Rfl: 0   predniSONE (DELTASONE) 10 MG tablet, Take 4 tablets x 1 day and then decrease by 1/2 tablet per day until down to zero mg. (Patient not taking: Reported on 10/08/2022), Disp: 18 tablet, Rfl: 0   RABEprazole (ACIPHEX) 20 MG tablet, TAKE 1 TABLET BY MOUTH TWICE A DAY BEFORE A MEAL, Disp: 180 tablet, Rfl: 1   senna (SENOKOT) 8.6 MG TABS tablet, Take 1 tablet by mouth 2 (two) times daily., Disp: , Rfl:    Simethicone (GAS-X PO), Take 2 tablets by mouth 2 (two) times daily., Disp: , Rfl:    sodium chloride (OCEAN) 0.65 % nasal spray, Place 1 spray into the nose as needed., Disp: , Rfl:     SYNTHROID 100 MCG tablet, Take 1 tablet (100 mcg total) by mouth daily., Disp: 90 tablet, Rfl: 3   triamcinolone ointment (KENALOG) 0.1 %, APPLY TWICE DAILY TO BITES AND RASH UNTIL FLAT AND SMOOTH **DO NOT APPLY TO FACE**, Disp: 80 g, Rfl: 0   Medications ordered in this encounter:  No orders of the defined types were placed in this encounter.    *If you need refills on other medications prior to your next appointment, please contact your pharmacy*  Follow-Up: Call back or seek an in-person evaluation if the symptoms worsen or if the condition fails to improve as anticipated.  [facility] [phone]  Other Instructions NPO until evaluated by ED   If you have been instructed to have an in-person evaluation today at a local Urgent Care facility, please use the link below. It will take you to a list of all of our available [facility], including address, phone number and hours of operation. Please do not delay care.  [facility]  If you or a family member do not have a primary care provider, use the link below to schedule a visit and establish care. When you choose a [facility] primary care physician or advanced practice provider, you gain a long-term partner in health. Find a Primary Care Provider  Learn more about [facility]'s in-office and virtual care options: [facility]

## 2022-10-12 NOTE — Progress Notes (Signed)
Virtual Visit Consent   ARLOINE WIDMARK, you are scheduled for a virtual visit with Mary-Margaret Hassell Done, [place], a [facility] provider, today.     Just as with appointments in the office, your consent must be obtained to participate.  Your consent will be active for this visit and any virtual visit you may have with one of our providers in the next 365 days.     If you have a MyChart account, a copy of this consent can be sent to you electronically.  All virtual visits are billed to your insurance company just like a traditional visit in the office.    As this is a virtual visit, video technology does not allow for your provider to perform a traditional examination.  This may limit your provider's ability to fully assess your condition.  If your provider identifies any concerns that need to be evaluated in person or the need to arrange testing (such as labs, EKG, etc.), we will make arrangements to do so.     Although advances in technology are sophisticated, we cannot ensure that it will always work on either your end or our end.  If the connection with a video visit is poor, the visit may have to be switched to a telephone visit.  With either a video or telephone visit, we are not always able to ensure that we have a secure connection.     I need to obtain your verbal consent now.   Are you willing to proceed with your visit today? YES   ALYASIA DUBOSE has provided verbal consent on 10/12/2022 for a virtual visit (video or telephone).   Mary-Margaret Hassell Done, FNP   Date: 10/12/2022 3:55 PM   Virtual Visit via Video Note   I, Mary-Margaret Hassell Done, connected with LYLIAN TYMINSKI ([id number], 1930-09-13) on 10/12/22 at  4:00 PM EST by a video-enabled telemedicine application and verified that I am speaking with the correct person using two identifiers.  Location: Patient: Virtual Visit Location Patient: Home Provider: Virtual Visit Location Provider: Mobile   I discussed the limitations of evaluation  and management by telemedicine and the availability of in person appointments. The patient expressed understanding and agreed to proceed.    History of Present Illness: Kimberly Roy is a 89 y.o. who identifies as a female who was assigned female at birth, and is being seen today for nausea, diarrhea and covid positive.  HPI: Patient has been having nausea and vomiting. The diarrhea started 3 days ago but she has not taken anything for it. Her last bowel movement 8AM. She vomiting around 9am this morning. She threw up again at 11AM. She has not vomited sincethen but has nausea.     Review of Systems  Constitutional:  Positive for fever (99.7). Negative for chills.  Gastrointestinal:  Positive for diarrhea, nausea and vomiting. Abdominal pain: 10/10.   Problems:  Patient Active Problem List   Diagnosis Date Noted   Trochanteric bursitis, right hip 10/10/2022   Earlobe lesion, right 09/12/2022   Pneumonia 07/12/2022   Hypertensive urgency 07/12/2022   Aortic stenosis 07/12/2022   Soft tissue injury 07/12/2022   Anemia 04/13/2022   B12 deficiency 04/04/2022   Interstitial cystitis 03/28/2022   Iron deficiency 02/28/2022   Right foot pain 01/29/2022   SOB (shortness of breath) 10/14/2021   Rash 03/03/2021   Skin lesion 03/03/2021   Aortic atherosclerosis ([place]) 11/20/2020   Bradycardia 06/08/2020   Itching 04/08/2020   Dyspnea 01/17/2020  Chronic respiratory failure with hypoxia (HCC) 01/17/2020   Right hip pain 01/10/2020   Leukocytosis 11/15/2019   Wound of buttock 10/25/2019   COVID-19 virus infection 10/04/2019   Urinary frequency 09/26/2019   Cold feeling 09/12/2019   Left knee pain 07/04/2019   Hemoptysis 06/30/2019   Chronic diastolic CHF (congestive heart failure) ([place]) 08/10/2018   Femur fracture ([place]) 08/06/2018   Primary osteoarthritis of right shoulder 08/15/2017   Right shoulder pain 05/16/2017   Chronic venous insufficiency 02/10/2017   Lymphedema 02/10/2017    Neuropathy 12/25/2016   Anemia due to blood loss, chronic 08/07/2016   GERD (gastroesophageal reflux disease) 12/10/2015   Finger pain 11/13/2015   Carotid artery calcification 09/26/2015   External nasal lesion 09/25/2015   Muscle cramps 09/01/2015   Excessive sweating 07/30/2015   Headache 07/16/2015   Groin pain 05/14/2015   Muscle twitching 03/05/2015   Leg cramps 02/11/2015   Back pain 02/11/2015   Abdominal pain 02/11/2015   Chronic pain 01/19/2015   Acute cystitis without hematuria 12/12/2014   Left elbow pain 11/30/2014   Health care maintenance 11/30/2014   Osteoporosis 10/19/2014   Rectal bleeding 10/19/2014   Neck pain 09/03/2014   Unsteady gait 09/03/2014   Nocturia 09/03/2014   Dysphagia 06/01/2014   Nasal dryness 06/01/2014   Stress 06/01/2014   Fatigue 02/27/2014   Degenerative disc disease 12/21/2013   Pre-op evaluation 10/12/2013   Hoarseness 08/14/2013   Leg swelling 01/24/2013   CHF (congestive heart failure) ([place]) 12/29/2012   Cough 12/29/2012   OSA (obstructive sleep apnea) 07/09/2012   Osteoarthritis 07/04/2012   Symptomatic anemia 07/04/2012   Chronic constipation 07/04/2012   Pulmonary hypertension ([place]) 07/04/2012   Pulmonary nodules 07/04/2012   Hypertension 07/04/2012   Hyperlipidemia 07/04/2012   Hypothyroidism 07/04/2012    Allergies:  Allergies  Allergen Reactions   Lyrica [Pregabalin] Swelling   Omnicef [Cefdinir] Diarrhea and Nausea And Vomiting   Atarax [Hydroxyzine]     jittery   Dicyclomine Other (See Comments)    Abdominal bloating    Hydroxyzine Hcl     jittery   Levaquin [Levofloxacin] Swelling   Nitrofurantoin Diarrhea   Nucynta Er [Tapentadol Hcl Er] Other (See Comments)    Severe constipation    Oxybutynin Other (See Comments)    Blurred vision   Zoloft [Sertraline Hcl]     Severe headache   Biaxin [Clarithromycin] Other (See Comments) and Rash    Pt does not remember Pt does not remember   Meloxicam Rash    Sertraline Nausea And Vomiting    Severe headache Severe headache Other reaction(s): Headache Severe headache   Silicone Other (See Comments) and Rash    Durabond - redness Durabond - redness    Durabond - redness   Sulfa Antibiotics Rash    Pt does not remember   Sulfasalazine Rash    Pt does not remember   Tape Rash    Durabond - redness   Tapentadol Other (See Comments) and Rash    Severe constipation Severe constipation Severe constipation Severe constipation Severe constipation   Venofer [Iron Sucrose] Rash   Medications:  Current Outpatient Medications:    albuterol (VENTOLIN HFA) 108 (90 Base) MCG/ACT inhaler, INHALE 2 PUFFS INTO THE LUNGS EVERY 6 HOURS AS NEEDED FOR WHEEZING OR SHORTNESS OF BREATH, Disp: 18 g, Rfl: 0   aluminum-magnesium hydroxide-simethicone (MAALOX) I037812 MG/5ML SUSP, Take 15 mLs by mouth 4 (four) times daily -  before meals and at bedtime. , Disp: , Rfl:  amLODipine (NORVASC) 10 MG tablet, TAKE 1 TABLET BY MOUTH DAILY, Disp: 90 tablet, Rfl: 1   aspirin 81 MG EC tablet, Take 81 mg by mouth daily. Every other day, Disp: , Rfl:    atorvastatin (LIPITOR) 20 MG tablet, TAKE 1 TABLET BY MOUTH AT BEDTIME, Disp: 90 tablet, Rfl: 1   benazepril (LOTENSIN) 40 MG tablet, TAKE 1 TABLET BY MOUTH DAILY, Disp: 90 tablet, Rfl: 1   betamethasone dipropionate 0.05 % cream, Apply 1 application topically 2 (two) times daily., Disp: , Rfl:    busPIRone (BUSPAR) 10 MG tablet, Take 10 mg by mouth 3 (three) times daily., Disp: , Rfl:    calcium citrate-vitamin D (CITRACAL+D) 315-200 MG-UNIT tablet, Take 2 tablets by mouth daily., Disp: , Rfl:    Cholecalciferol (VITAMIN D3) 1000 UNITS CAPS, Take 1 capsule by mouth daily., Disp: , Rfl:    clobetasol cream (TEMOVATE) [id number] %, Apply 1 application topically See admin instructions. Apply to affected areas of body 1 - 2 times daily as needed for itchy bumps. Avoid applying to face, groin, and axilla. Use as directed., Disp: 60  g, Rfl: 1   cyanocobalamin (VITAMIN B12) 1000 MCG/ML injection, Inject 1 mL (1,000 mcg total) into the muscle every 3 (three) months., Disp: 12 mL, Rfl: 0   Diclofenac Sodium 3 % GEL, Apply topically 2 (two) times daily., Disp: , Rfl:    Docusate Sodium (DSS) 100 MG CAPS, Take 100 mg by mouth 2 (two) times daily., Disp: , Rfl:    doxycycline (VIBRA-TABS) 100 MG tablet, Take 1 tablet (100 mg total) by mouth 2 (two) times daily. (Patient not taking: Reported on 10/08/2022), Disp: 20 tablet, Rfl: 0   DULoxetine (CYMBALTA) 60 MG capsule, Take 60 mg by mouth daily., Disp: , Rfl:    Fluocinolone Acetonide 0.01 % OIL, Apply 1-2 drops into ears once to twice daily as needed., Disp: 20 mL, Rfl: 3   fluocinonide (LIDEX) 0.05 % external solution, Apply topically 2 (two) times daily. Apply drops to scalp for itch, Disp: 60 mL, Rfl: 3   fluticasone (FLONASE) 50 MCG/ACT nasal spray, Place 2 sprays into both nostrils daily., Disp: 16 g, Rfl: 6   furosemide (LASIX) 20 MG tablet, TAKE 1 TABLET BY MOUTH DAILY AS NEEDED, Disp: 90 tablet, Rfl: 0   gabapentin (NEURONTIN) 300 MG capsule, Take two tablets tid (in am, midday and q hs)., Disp: 540 capsule, Rfl: 1   HYDROcodone-acetaminophen (NORCO) 10-325 MG tablet, Take 1 tablet by mouth 3 (three) times daily., Disp: 10 tablet, Rfl: 0   Infant Care Products (DERMACLOUD) OINT, Apply topically as directed (as discussed)., Disp: 430 g, Rfl: 1   ipratropium (ATROVENT) 0.03 % nasal spray, Place 2 sprays into both nostrils every 12 (twelve) hours., Disp: 30 mL, Rfl: 1   ketoconazole (NIZORAL) 2 % shampoo, Apply 1 Application topically See admin instructions. apply three times per week, massage into scalp and leave in for 10 minutes before rinsing out, Disp: 120 mL, Rfl: 3   levalbuterol (XOPENEX) 0.63 MG/3ML nebulizer solution, Take 3 mLs (0.63 mg total) by nebulization every 6 (six) hours as needed for wheezing or shortness of breath., Disp: 3 mL, Rfl: 1   LINZESS 145 MCG CAPS  capsule, Take 145 mcg by mouth daily., Disp: , Rfl:    magnesium oxide (MAG-OX) 400 MG tablet, Take 1 tablet (400 mg total) by mouth daily., Disp: 30 tablet, Rfl: 1   methylPREDNISolone (MEDROL DOSEPAK) 4 MG TBPK tablet, Take 4 mg by  mouth. Taper pak, Disp: , Rfl:    metoprolol succinate (TOPROL-XL) 50 MG 24 hr tablet, TAKE 1 TABLET BY MOUTH DAILY. TAKE WITH OR IMMEDIATELY FOLLOWING A MEAL., Disp: 90 tablet, Rfl: 1   morphine (MS CONTIN) 30 MG 12 hr tablet, Take 30 mg by mouth every 12 (twelve) hours., Disp: , Rfl:    morphine (MS CONTIN) 60 MG 12 hr tablet, Take 1 tablet (60 mg total) by mouth 2 (two) times daily., Disp: 10 tablet, Rfl: 0   mupirocin ointment (BACTROBAN) 2 %, Apply 1 Application topically 2 (two) times daily., Disp: 22 g, Rfl: 0   Naldemedine Tosylate 0.2 MG TABS, Take 0.2 mg by mouth daily., Disp: , Rfl:    nystatin (MYCOSTATIN/NYSTOP) powder, Apply 1 application. topically 2 (two) times daily as needed., Disp: 60 g, Rfl: 0   nystatin cream (MYCOSTATIN), Apply 1 Application topically 2 (two) times daily., Disp: 60 g, Rfl: 0   polyethylene glycol powder (GLYCOLAX/MIRALAX) 17 GM/SCOOP powder, MIX 17 GRAMS AS MARKED ON BOTTLE TOP IN 8 OUNCES OF WATER AND DRINK ONCE A DAY AS DIRECTED., Disp: 527 g, Rfl: 0   predniSONE (DELTASONE) 10 MG tablet, Take 4 tablets x 1 day and then decrease by 1/2 tablet per day until down to zero mg. (Patient not taking: Reported on 10/08/2022), Disp: 18 tablet, Rfl: 0   RABEprazole (ACIPHEX) 20 MG tablet, TAKE 1 TABLET BY MOUTH TWICE A DAY BEFORE A MEAL, Disp: 180 tablet, Rfl: 1   senna (SENOKOT) 8.6 MG TABS tablet, Take 1 tablet by mouth 2 (two) times daily., Disp: , Rfl:    Simethicone (GAS-X PO), Take 2 tablets by mouth 2 (two) times daily., Disp: , Rfl:    sodium chloride (OCEAN) 0.65 % nasal spray, Place 1 spray into the nose as needed., Disp: , Rfl:    SYNTHROID 100 MCG tablet, Take 1 tablet (100 mcg total) by mouth daily., Disp: 90 tablet, Rfl: 3    triamcinolone ointment (KENALOG) 0.1 %, APPLY TWICE DAILY TO BITES AND RASH UNTIL FLAT AND SMOOTH **DO NOT APPLY TO FACE**, Disp: 80 g, Rfl: 0  Observations/Objective: Patient is well-developed, well-nourished in no acute distress.  Resting comfortably  at home.  Head is normocephalic, atraumatic.  No labored breathing.  Speech is clear and coherent with logical content.  Patient is alert and oriented at baseline.  10/10 abdominal pain  Assessment and Plan:  [name] in today with chief complaint of Abdominal pain  1. Generalized abdominal pain 2. Fever, unspecified fever cause 3. Diarrhea, unspecified type NPO To ED ASAP Daughter agreed to take her to the hospital     Follow Up Instructions: I discussed the assessment and treatment plan with the patient. The patient was provided an opportunity to ask questions and all were answered. The patient agreed with the plan and demonstrated an understanding of the instructions.  A copy of instructions were sent to the patient via MyChart.  The patient was advised to call back or seek an in-person evaluation if the symptoms worsen or if the condition fails to improve as anticipated.  Time:  I spent 11 minutes with the patient via telehealth technology discussing the above problems/concerns.    Mary-Margaret Hassell Done, FNP

## 2022-10-15 ENCOUNTER — Telehealth: Payer: Self-pay | Admitting: Internal Medicine

## 2022-10-15 NOTE — Telephone Encounter (Signed)
If not eating and persistent diarrhea and vomiting - needs to be evaluated.

## 2022-10-15 NOTE — Telephone Encounter (Signed)
Pt advised needs eval - will go to [facility] the [address] now

## 2022-10-15 NOTE — Telephone Encounter (Signed)
Patient called to let Dr Nicki Reaper know that she has had diarrhea since Friday, she threw up 2x on Sunday. She is not eating very much. She wanted to know if Dr Nicki Reaper wanted her to go any where to be seen. Patient does have an up coming appointment on 10/17/2022.

## 2022-10-16 ENCOUNTER — Ambulatory Visit: Payer: Self-pay

## 2022-10-16 ENCOUNTER — Emergency Department: Payer: Medicare HMO

## 2022-10-16 ENCOUNTER — Encounter: Payer: Self-pay | Admitting: Emergency Medicine

## 2022-10-16 ENCOUNTER — Emergency Department
Admission: EM | Admit: 2022-10-16 | Discharge: 2022-10-16 | Disposition: A | Discharge status: Home / Self Care | Payer: Medicare HMO | Attending: Emergency Medicine | Admitting: Emergency Medicine

## 2022-10-16 DIAGNOSIS — K5730 Diverticulosis of large intestine without perforation or abscess without bleeding: Secondary | ICD-10-CM | POA: Diagnosis not present

## 2022-10-16 DIAGNOSIS — Z20822 Contact with and (suspected) exposure to covid-19: Secondary | ICD-10-CM | POA: Insufficient documentation

## 2022-10-16 DIAGNOSIS — R109 Unspecified abdominal pain: Secondary | ICD-10-CM | POA: Diagnosis not present

## 2022-10-16 DIAGNOSIS — R531 Weakness: Secondary | ICD-10-CM | POA: Insufficient documentation

## 2022-10-16 DIAGNOSIS — N281 Cyst of kidney, acquired: Secondary | ICD-10-CM | POA: Diagnosis not present

## 2022-10-16 DIAGNOSIS — R197 Diarrhea, unspecified: Secondary | ICD-10-CM | POA: Insufficient documentation

## 2022-10-16 LAB — CBC
HCT: 33.7 % — ABNORMAL LOW (ref 36.0–46.0)
Hemoglobin: 11.1 g/dL — ABNORMAL LOW (ref 12.0–15.0)
MCH: 32.5 pg (ref 26.0–34.0)
MCHC: 32.9 g/dL (ref 30.0–36.0)
MCV: 98.5 fL (ref 80.0–100.0)
Platelets: 191 10*3/uL (ref 150–400)
RBC: 3.42 MIL/uL — ABNORMAL LOW (ref 3.87–5.11)
RDW: 13.5 % (ref 11.5–15.5)
WBC: 6.8 10*3/uL (ref 4.0–10.5)
nRBC: 0 % (ref 0.0–0.2)

## 2022-10-16 LAB — COMPREHENSIVE METABOLIC PANEL
ALT: 13 U/L (ref 0–44)
AST: 20 U/L (ref 15–41)
Albumin: 3.7 g/dL (ref 3.5–5.0)
Alkaline Phosphatase: 73 U/L (ref 38–126)
Anion gap: 9 (ref 5–15)
BUN: 13 mg/dL (ref 8–23)
CO2: 27 mmol/L (ref 22–32)
Calcium: 9 mg/dL (ref 8.9–10.3)
Chloride: 102 mmol/L (ref 98–111)
Creatinine, Ser: 0.64 mg/dL (ref 0.44–1.00)
GFR, Estimated: >60 mL/min (ref >60)
Glucose, Bld: 95 mg/dL (ref 70–99)
Potassium: 3.9 mmol/L (ref 3.5–5.1)
Sodium: 138 mmol/L (ref 135–145)
Total Bilirubin: 0.9 mg/dL (ref 0.3–1.2)
Total Protein: 6.6 g/dL (ref 6.5–8.1)

## 2022-10-16 LAB — RESP PANEL BY RT-PCR (RSV, FLU A&B, COVID)  RVPGX2
Influenza A by PCR: NEGATIVE
Influenza B by PCR: NEGATIVE
Resp Syncytial Virus by PCR: NEGATIVE
SARS Coronavirus 2 by RT PCR: NEGATIVE

## 2022-10-16 LAB — URINALYSIS, ROUTINE W REFLEX MICROSCOPIC
Bilirubin Urine: NEGATIVE
Glucose, UA: NEGATIVE mg/dL
Hgb urine dipstick: NEGATIVE
Ketones, ur: 5 mg/dL — AB
Leukocytes,Ua: NEGATIVE
Nitrite: NEGATIVE
Protein, ur: NEGATIVE mg/dL
Specific Gravity, Urine: 1.013 (ref 1.005–1.030)
pH: 8 (ref 5.0–8.0)

## 2022-10-16 LAB — LIPASE, BLOOD: Lipase: 25 U/L (ref 11–51)

## 2022-10-16 MED ORDER — HYDROMORPHONE HCL 1 MG/ML IJ SOLN
1.0000 mg | Freq: Once | INTRAMUSCULAR | Status: AC
  Administered 2022-10-16: 1 mg via INTRAVENOUS
  Filled 2022-10-16: qty 1

## 2022-10-16 MED ORDER — IOHEXOL 300 MG/ML  SOLN
100.0000 mL | Freq: Once | INTRAMUSCULAR | Status: AC | PRN
  Administered 2022-10-16: 100 mL via INTRAVENOUS

## 2022-10-16 MED ORDER — METOPROLOL SUCCINATE ER 50 MG PO TB24
50.0000 mg | ORAL_TABLET | Freq: Once | ORAL | Status: AC
  Administered 2022-10-16: 50 mg via ORAL
  Filled 2022-10-16: qty 1

## 2022-10-16 MED ORDER — AMLODIPINE BESYLATE 5 MG PO TABS
10.0000 mg | ORAL_TABLET | Freq: Once | ORAL | Status: AC
  Administered 2022-10-16: 10 mg via ORAL
  Filled 2022-10-16: qty 2

## 2022-10-16 MED ORDER — SODIUM CHLORIDE 0.9 % IV BOLUS
1000.0000 mL | Freq: Once | INTRAVENOUS | Status: AC
  Administered 2022-10-16: 1000 mL via INTRAVENOUS

## 2022-10-16 MED ORDER — BENAZEPRIL HCL 20 MG PO TABS
40.0000 mg | ORAL_TABLET | Freq: Once | ORAL | Status: AC
  Administered 2022-10-16: 40 mg via ORAL
  Filled 2022-10-16: qty 2

## 2022-10-16 MED ORDER — BUSPIRONE HCL 5 MG PO TABS
10.0000 mg | ORAL_TABLET | Freq: Once | ORAL | Status: AC
  Administered 2022-10-16: 10 mg via ORAL
  Filled 2022-10-16: qty 2

## 2022-10-16 NOTE — ED Notes (Addendum)
Stool sample set up given to dtr per MD since pt unable to obtain sample during this visit. . Instructions on collection provided.

## 2022-10-16 NOTE — ED Notes (Signed)
See triage note, pt reports has had several days of N/V/D; states hasn't had vomiting in few days but remains nauseous at smell of food so hasn't eaten much; states last episode of diarrhea was yesterday. Pt reports tenderness all over abdomen. Pt's resp reg/unlabored, skin dry and laying calmly on stretcher. Visitors with pt.

## 2022-10-16 NOTE — ED Notes (Signed)
Pure wick applied. Warm blanket given.

## 2022-10-16 NOTE — ED Provider Notes (Signed)
[facility] Provider Note   Event Date/Time   First MD Initiated Contact with Patient 10/16/22 [phone]     (approximate) History  Weakness and Diarrhea  HPI Kimberly Roy is a 89 y.o. female with past medical history of chronic back pain on chronic opiates as well as spinal stimulator who presents for 1 week of diarrhea and increasing weakness.  Patient also complains of suprapubic abdominal pain that is 8/10 and is stable for her chronic back pain.  Patient does endorse worsening generalized weakness due to what she describes as dehydration over this time.  Patient states that she has been trying over-the-counter antidiarrheals that have not been improving her symptoms. ROS: Patient currently denies any vision changes, tinnitus, difficulty speaking, facial droop, sore throat, chest pain, shortness of breath, abdominal pain, nausea/vomiting, dysuria, or weakness/numbness/paresthesias in any extremity   Physical Exam  Triage Vital Signs: ED Triage Vitals  Enc Vitals Group     BP 10/16/22 0932 (!) 171/73     Pulse Rate 10/16/22 0932 (!) 36     Resp 10/16/22 0932 16     Temp 10/16/22 0932 98.3 F (36.8 C)     Temp Source 10/16/22 0932 Oral     SpO2 10/16/22 0932 94 %     Weight 10/16/22 0931 169 lb 12.1 oz (77 kg)     Height 10/16/22 0931 5' 2"$  (1.575 m)     Head Circumference --      Peak Flow --      Pain Score 10/16/22 0933 8     Pain Loc --      Pain Edu? --      Excl. in [place]? --    Most recent vital signs: Vitals:   10/16/22 1500 10/16/22 1524  BP: (!) 163/64   Pulse:  75  Resp:    Temp:    SpO2: 100% 96%   General: Awake, oriented x4. CV:  Good peripheral perfusion.  Resp:  Normal effort.  Abd:  No distention.  Suprapubic tenderness to palpation Other:  Elderly overweight Caucasian female laying in bed in no acute distress ED Results / Procedures / Treatments  Labs (all labs ordered are listed, but only abnormal results are displayed) Labs  Reviewed  CBC - Abnormal; Notable for the following components:      Result Value   RBC 3.42 (*)    Hemoglobin 11.1 (*)    HCT 33.7 (*)    All other components within normal limits  URINALYSIS, ROUTINE W REFLEX MICROSCOPIC - Abnormal; Notable for the following components:   Color, Urine STRAW (*)    APPearance CLEAR (*)    Ketones, ur 5 (*)    All other components within normal limits  RESP PANEL BY RT-PCR (RSV, FLU A&B, COVID)  RVPGX2  C DIFFICILE QUICK SCREEN W PCR REFLEX    GASTROINTESTINAL PANEL BY PCR, STOOL (REPLACES STOOL CULTURE)  LIPASE, BLOOD  COMPREHENSIVE METABOLIC PANEL   EKG ED ECG REPORT I, Naaman Plummer, the attending physician, personally viewed and interpreted this ECG. Date: 10/16/2022 EKG Time: 0937 Rate: 71 Rhythm: normal sinus rhythm with bigeminy QRS Axis: normal Intervals: normal ST/T Wave abnormalities: normal Narrative Interpretation: no evidence of acute ischemia RADIOLOGY ED MD interpretation: CT of the abdomen pelvis with IV contrast interpreted independently by me and shows development of dilation of the biliary tree with filling defect suggested along the distal common duct -Agree with radiology assessment Official radiology report(s): CT Abdomen Pelvis  W Contrast  Result Date: 10/16/2022 CLINICAL DATA:  Diarrhea and vomiting for a week. Abdominal pain remote history of C diff colitis EXAM: CT ABDOMEN AND PELVIS WITH CONTRAST TECHNIQUE: Multidetector CT imaging of the abdomen and pelvis was performed using the standard protocol following bolus administration of intravenous contrast. RADIATION DOSE REDUCTION: This exam was performed according to the departmental dose-optimization program which includes automated exposure control, adjustment of the mA and/or kV according to patient size and/or use of iterative reconstruction technique. CONTRAST:  123m OMNIPAQUE IOHEXOL 300 MG/ML  SOLN COMPARISON:  06/13/2021 FINDINGS: Lower chest: Heart is enlarged.  Coronary artery calcifications are seen. Calcifications as well along the proximal aorta in the aortic valve region. There are areas of scarring and atelectatic changes along the lung bases with bronchiectasis. Is also some noncalcified basilar lung nodules. Example right lower lobe posteriorly series 4, image 13 measuring 7 mm and left lower lobe image 36 measuring 7 mm. Additional smaller focus just anterior to this on image 31. These were seen on the more recent chest CT of 07/07/2022. these are also present going back to a CT scan of the abdomen pelvis from 04/30/2018 demonstrating long-term stability. No specific additional follow-up. Hepatobiliary: Gallbladder is distended. There is biliary duct dilatation diffusely the of the ampulla with nodular area along the distal common duct as seen on sagittal image 63 of series 6, axial image 28 of series 2 and coronal image 49 of series 5. Filling defect or mass is possible recommend further evaluation. No separate intrahepatic space-occupying lesion. Portal vein is patent. Pancreas: Global pancreatic atrophy without obvious duct dilatation or enhancing mass. Spleen: Tiny low-attenuation lesion seen in the superior margin of the spleen on series 2, image 16 is stable going back 2019 may represent a benign cysts. No additional imaging follow-up. Adrenals/Urinary Tract: Minimal thickening of left adrenal gland is stable. There is also a right adrenal nodule with some central macroscopic fat consistent with a myelolipoma. This has been previously described. Mild bilateral renal atrophy. No clear enhancing renal mass or collecting system dilatation. Prominent renal sinus fat. The ureters have normal course and caliber down to the normal caliber bladder. There are multiple Bosniak 1 right-sided renal cysts. Largest is seen posterior from the lower pole measuring 4.7 by 3.8 cm in diameter with Hounsfield unit of 13. Few tiny areas elsewhere in each kidney as well. No  specific imaging follow-up. Stomach/Bowel: On this non oral contrast exam, large bowel has a normal course and caliber with moderate colonic stool. Slightly redundant course to the sigmoid colon with diverticula. The appendix is not clearly seen in the right lower quadrant but no pericecal stranding or fluid. Stomach is relatively collapsed. Small bowel is nondilated. Vascular/Lymphatic: Normal caliber aorta and IVC with scattered vascular calcifications. Only mildly tortuous course of the aorta. No specific abnormal lymph node enlargement present in the abdomen and pelvis. Reproductive: Status post hysterectomy. No adnexal masses. Other: Small fat containing umbilical hernia. Musculoskeletal: There is a battery pack seen in the subcutaneous tissues posterior to the upper right hemipelvis with lead extending superior towards the spine but does not enter the spine in the visualized field of view. Osteopenia with extensive deformity of the skeleton, unchanged from previous with trabecular thickening. Right hip hardware identified. IMPRESSION: Development of dilatation of the biliary tree with filling defect suggested along distal common duct. Mass lesion is not excluded recommend further evaluation. Aggressive lesion is in the differential. No bowel obstruction, free air or free fluid.  Right adrenal myelolipoma. Sigmoid colon diverticula Electronically Signed   By: Jill Side M.D.   On: 10/16/2022 11:09   PROCEDURES: Critical Care performed: No .1-3 Lead EKG Interpretation  Performed by: Naaman Plummer, MD Authorized by: Naaman Plummer, MD     Interpretation: normal     ECG rate:  71   ECG rate assessment: normal     Rhythm: sinus rhythm     Ectopy: none     Conduction: normal    MEDICATIONS ORDERED IN ED: Medications  sodium chloride 0.9 % bolus 1,000 mL (0 mLs Intravenous Stopped 10/16/22 1229)  iohexol (OMNIPAQUE) 300 MG/ML solution 100 mL (100 mLs Intravenous Contrast Given 10/16/22 1051)   HYDROmorphone (DILAUDID) injection 1 mg (1 mg Intravenous Given 10/16/22 1154)  benazepril (LOTENSIN) tablet 40 mg (40 mg Oral Given 10/16/22 1314)  amLODipine (NORVASC) tablet 10 mg (10 mg Oral Given 10/16/22 1314)  metoprolol succinate (TOPROL-XL) 24 hr tablet 50 mg (50 mg Oral Given 10/16/22 1313)  busPIRone (BUSPAR) tablet 10 mg (10 mg Oral Given 10/16/22 1444)   IMPRESSION / MDM / ASSESSMENT AND PLAN / ED COURSE  I reviewed the triage vital signs and the nursing notes.                             The patient is on the cardiac monitor to evaluate for evidence of arrhythmia and/or significant heart rate changes. Patient's presentation is most consistent with acute presentation with potential threat to life or bodily function. This patient presents with diarrhea consistent with likely self-limiting course. Doubt acute bacterial diarrhea. Considered, but think unlikely, partial SBO, appendicitis, diverticulitis, other intraabdominal infection. Low suspicion for secondary causes of diarrhea such as hyperadrenergic state, pheo, adrenal crisis, hyperthyroidism, or sepsis. Doubt antibiotic associated diarrhea.  Plan: PO rehydration, reassess, discharge with OTC antidiarrheal meds//short course antibiotics  Dispo: Discharge home with PCP follow-up and strict return precautions   FINAL CLINICAL IMPRESSION(S) / ED DIAGNOSES   Final diagnoses:  Generalized weakness  Diarrhea, unspecified type   Rx / DC Orders   ED Discharge Orders     None      Note:  This document was prepared using Dragon voice recognition software and may include unintentional dictation errors.   Naaman Plummer, MD 10/16/22 1537

## 2022-10-16 NOTE — Discharge Instructions (Addendum)
Please use 2 mg of Imodium (loperamide) after every loose stool up to a total of 8 mg or 4 doses in a 24-hour period  I made a referral to gastroenterology for a follow-up appointment to recheck on your symptoms and to do further investigations of an abnormal finding on your CT scan of the abdomen, however we are unable to further characterize it today with an MRI because of your device in your back.  Thank you for choosing [place] for your health care today!  Please see your primary doctor this week for a follow up appointment.   Sometimes, in the early stages of certain disease courses it is difficult to detect in the emergency department evaluation -- so, it is important that you continue to monitor your symptoms and call your doctor right away or return to the emergency department if you develop any new or worsening symptoms.  Please go to the following website to schedule new (and existing) patient appointments:   [email or web address]  If you do not have a primary doctor try calling the following clinics to establish care:  If you have insurance:  [facility] [phone] [place] [address]   [facility]  [phone] [place] [place] [address]   If you do not have insurance:  [facility]  [id number] [address] [place] [address]   The following is another list of primary care offices in the area who are accepting new patients at this time.  Please reach out to one of them directly and let them know you would like to schedule an appointment to follow up on an Emergency Department visit, and/or to establish a new primary care provider (PCP).  There are likely other primary care clinics in the are who are accepting new patients, but this is an excellent place to start:  [place] physician: Dr Lavon Paganini [address] [place], [place]  [address] [phone]  [facility] Lead Physician: Dr Steele Sizer [address], [place], [place] [address] [phone]  [place] Physician: Dr Park Liter [address] [place], [place] [address] [phone]  [facility] Lead Physician: Dr Dewaine Oats [place], [place], [place] [address] [phone]  [facility] [place] Physician: Dr Halina Maidens [address] [place], [place] [address] [phone]   It was my pleasure to care for you today.

## 2022-10-16 NOTE — ED Notes (Addendum)
Pt back to room; fluids restarted. Pt asking about eating food; notified doc will need to assess results of images first so will be at least 30-23mns before this RN can give pt answer. Pt/family understanding.

## 2022-10-16 NOTE — ED Notes (Addendum)
Pt requesting her home buspar be ordered; pt calm but states feels anxious; states did "not yet take my 3 BP meds today". EDP Bradler notified via secure chat; pt given crackers and gingerale at her request with verbal okay from [place].

## 2022-10-16 NOTE — ED Provider Notes (Signed)
I got signout on this patient pending discharge.  She is comfortable and eagerly awaiting discharge.  She will collect her stool for C. difficile and stool studies at home and bring her to her primary doctor, she has an appointment scheduled already tomorrow.  Noticed some biliary dilation on her CT scan but her liver function and bilirubin testing were normal and she has no tenderness to palpation in her right upper quadrant on my recheck.  She looks comfortable.  She was unable to go MRCP today due to a device in her back.  Made a referral to gastroenterology for further workup as needed.  She has an appointment tomorrow with PMD.  She understands to return to the emergency department with any new or worsening symptoms.   Lucillie Garfinkel, MD 10/16/22 [phone]

## 2022-10-16 NOTE — ED Notes (Signed)
EDP Bradler back to bedside to re-educate/update on imaging as pt felt confused about what provider said earlier.

## 2022-10-16 NOTE — ED Notes (Signed)
Float RN Benjamine Mola assisting this RN and will give pt scheduled meds soon.

## 2022-10-16 NOTE — ED Notes (Signed)
Pt's daughter heading to car to see if she has a card related to pt's stimulator at MRI staff's request. Will relay info to MRI staff if daughter can find card.

## 2022-10-16 NOTE — ED Notes (Signed)
EDP Bradler notified pt stated she cannot get an MRI since has stimulator in her back.

## 2022-10-16 NOTE — ED Triage Notes (Signed)
Pt reports diarrhea and vomiting x1 week. Also endorses 8/10 generalized abd pain. Reports a hx of cdiff.

## 2022-10-16 NOTE — ED Notes (Signed)
Fluids paused as pt left for imaging and is CT with contrast. Will restart once pt back to room.

## 2022-10-17 ENCOUNTER — Encounter: Payer: Self-pay | Admitting: Internal Medicine

## 2022-10-17 ENCOUNTER — Ambulatory Visit (INDEPENDENT_AMBULATORY_CARE_PROVIDER_SITE_OTHER): Payer: Medicare HMO | Admitting: Internal Medicine

## 2022-10-17 VITALS — BP 122/70 | HR 60 | Temp 98.1°F | Resp 16 | Ht 62.0 in | Wt 164.7 lb

## 2022-10-17 DIAGNOSIS — J9611 Chronic respiratory failure with hypoxia: Secondary | ICD-10-CM

## 2022-10-17 DIAGNOSIS — I509 Heart failure, unspecified: Secondary | ICD-10-CM

## 2022-10-17 DIAGNOSIS — I872 Venous insufficiency (chronic) (peripheral): Secondary | ICD-10-CM | POA: Diagnosis not present

## 2022-10-17 DIAGNOSIS — R935 Abnormal findings on diagnostic imaging of other abdominal regions, including retroperitoneum: Secondary | ICD-10-CM

## 2022-10-17 DIAGNOSIS — I5032 Chronic diastolic (congestive) heart failure: Secondary | ICD-10-CM | POA: Diagnosis not present

## 2022-10-17 DIAGNOSIS — I10 Essential (primary) hypertension: Secondary | ICD-10-CM

## 2022-10-17 DIAGNOSIS — G629 Polyneuropathy, unspecified: Secondary | ICD-10-CM

## 2022-10-17 DIAGNOSIS — I700 Atherosclerosis of aorta: Secondary | ICD-10-CM | POA: Diagnosis not present

## 2022-10-17 DIAGNOSIS — D649 Anemia, unspecified: Secondary | ICD-10-CM

## 2022-10-17 DIAGNOSIS — I350 Nonrheumatic aortic (valve) stenosis: Secondary | ICD-10-CM

## 2022-10-17 DIAGNOSIS — K219 Gastro-esophageal reflux disease without esophagitis: Secondary | ICD-10-CM

## 2022-10-17 DIAGNOSIS — E785 Hyperlipidemia, unspecified: Secondary | ICD-10-CM

## 2022-10-17 DIAGNOSIS — E538 Deficiency of other specified B group vitamins: Secondary | ICD-10-CM | POA: Diagnosis not present

## 2022-10-17 DIAGNOSIS — E033 Postinfectious hypothyroidism: Secondary | ICD-10-CM

## 2022-10-17 DIAGNOSIS — I272 Pulmonary hypertension, unspecified: Secondary | ICD-10-CM

## 2022-10-17 NOTE — Progress Notes (Signed)
Subjective:    Patient ID: [name], female    DOB: 01/31/1931, 89 y.o.   MRN: [id number]  Patient here for  Chief Complaint  Patient presents with   Medical Management of Chronic Issues    HPI Here to follow up regarding hypertension and hypercholesterolemia.  Recently admitted and diagnosed with flu.  Had persistent cough last visit.  This has improved.  No significant cough.  Breathing better.  Saw cardiology 09/18/22 - no changes made.  Continue lasix following weight.  Saw Dr Rogue Bussing - 10/08/22 - f/u anemia.  Stable.  Evaluated virtual visit 10/12/22 - nausea and vomiting and diarrhea.  Recommended ER evaluation. Was seen yesterday - ER - persistent diarrhea and weakness and abdominal pain.  CT yesterday - Development of dilatation of the biliary tree with filling defect suggested along distal common duct. Mass lesion is not excluded recommend further evaluation.  Reports she is feeling better.  Last bowel movement - 2 days ago.  Off iron currently.  Did eat a small amount last night.  Ate strudel and muffin this am.  No nausea/vomiting today.     Past Medical History:  Diagnosis Date   Anemia    Anxiety    Chest pain    CHF (congestive heart failure) (HCC)    Constipation    DDD (degenerative disc disease), cervical    Depression    DVT (deep venous thrombosis) (HCC)    Dysphonia    Dyspnea    Fatty liver    Fatty liver    Headache    Hyperlipidemia    Hyperpiesia    Hypertension    Hypothyroidism    Interstitial cystitis    Left ventricular dysfunction    Lymphedema    Nephrolithiasis    Obstructive sleep apnea    Osteoarthritis    knees/cervical and lumbar spine   Pulmonary hypertension (HCC)    Pulmonary nodules    followed by Dr Raul Del   Pure hypercholesterolemia    Renal cyst    right   Past Surgical History:  Procedure Laterality Date   ABDOMINAL HYSTERECTOMY     ovaries left in place   APPENDECTOMY     Back Surgeries     BACK SURGERY     BREAST  REDUCTION SURGERY     3/99   CARDIAC CATHETERIZATION     cataracts Bilateral    CERVICAL SPINE SURGERY     ESOPHAGEAL MANOMETRY N/A 08/02/2015   Procedure: ESOPHAGEAL MANOMETRY (EM);  Surgeon: Josefine Class, MD;  Location: [facility] ENDOSCOPY;  Service: Endoscopy;  Laterality: N/A;   ESOPHAGOGASTRODUODENOSCOPY N/A 02/27/2015   Procedure: ESOPHAGOGASTRODUODENOSCOPY (EGD);  Surgeon: Hulen Luster, MD;  Location: [facility] ENDOSCOPY;  Service: Gastroenterology;  Laterality: N/A;   ESOPHAGOGASTRODUODENOSCOPY (EGD) WITH PROPOFOL N/A 10/30/2018   Procedure: ESOPHAGOGASTRODUODENOSCOPY (EGD) WITH PROPOFOL;  Surgeon: Lollie Sails, MD;  Location: [facility] ENDOSCOPY;  Service: Endoscopy;  Laterality: N/A;   EXCISIONAL HEMORRHOIDECTOMY     EYE SURGERY     FRACTURE SURGERY     HEMORRHOID SURGERY     HIP SURGERY  2013   Right hip surgery   JOINT REPLACEMENT     KNEE ARTHROSCOPY     left and right   ORIF FEMUR FRACTURE Right 08/07/2018   Procedure: OPEN REDUCTION INTERNAL FIXATION (ORIF) DISTAL FEMUR FRACTURE;  Surgeon: Hessie Knows, MD;  Location: [facility] ORS;  Service: Orthopedics;  Laterality: Right;   REDUCTION MAMMAPLASTY Bilateral YRS AGO   REPLACEMENT TOTAL KNEE Bilateral  rotator cuff surgery     blilateral   TONSILECTOMY/ADENOIDECTOMY WITH MYRINGOTOMY     VISCERAL ARTERY INTERVENTION N/A 02/25/2019   Procedure: VISCERAL ARTERY INTERVENTION;  Surgeon: Algernon Huxley, MD;  Location: [place] [facility];  Service: Cardiovascular;  Laterality: N/A;   Family History  Problem Relation Age of Onset   Heart disease Mother    Stroke Mother    Hypertension Mother    Heart disease Father        myocardial infarction age 35   Breast cancer Neg Hx    Social History   Socioeconomic History   Marital status: Widowed    Spouse name: Not on file   Number of children: 2   Years of education: 37   Highest education level: 12th grade  Occupational History    Comment: insurance agency  Tobacco Use    Smoking status: Never   Smokeless tobacco: Never  Vaping Use   Vaping Use: Never used  Substance and Sexual Activity   Alcohol use: No    Alcohol/week: 0.0 standard drinks of alcohol   Drug use: Never   Sexual activity: Not Currently  Other Topics Concern   Not on file  Social History Narrative   Not on file   Social Determinants of Health   Financial Resource Strain: Low Risk  (03/09/2021)   Overall Financial Resource Strain (CARDIA)    Difficulty of Paying Living Expenses: Not hard at all  Food Insecurity: No Food Insecurity (09/05/2022)   Hunger Vital Sign    Worried About Running Out of Food in the Last Year: Never true    [place] in the Last Year: Never true  Transportation Needs: Unmet Transportation Needs (09/05/2022)   PRAPARE - Transportation    Lack of Transportation (Medical): Yes    Lack of Transportation (Non-Medical): Yes  Physical Activity: Inactive (03/09/2021)   Exercise Vital Sign    Days of Exercise per Week: 0 days    Minutes of Exercise per Session: 0 min  Stress: No Stress Concern Present (03/09/2021)   [place]    Feeling of Stress : Only a little  Social Connections: Socially Isolated (11/15/2021)   Social Connection and Isolation Panel [NHANES]    Frequency of Communication with Friends and Family: Three times a week    Frequency of Social Gatherings with Friends and Family: Twice a week    Attends Religious Services: Never    Marine scientist or Organizations: No    Attends Archivist Meetings: Never    Marital Status: Widowed     Review of Systems  Constitutional:  Positive for appetite change and fatigue. Negative for fever.  HENT:  Negative for congestion and sinus pressure.   Respiratory:  Negative for chest tightness.        Cough better.  Breathing stable - improved from previous infection.   Cardiovascular:  Negative for chest pain and palpitations.   Gastrointestinal:        Vomiting and nausea - better.  Able to eat small amount this am.    Genitourinary:  Negative for difficulty urinating and dysuria.  Musculoskeletal:  Negative for joint swelling and myalgias.  Skin:  Negative for color change and rash.  Neurological:  Negative for dizziness and headaches.  Psychiatric/Behavioral:  Negative for agitation and dysphoric mood.        Objective:     BP 122/70   Pulse 60  Temp 98.1 F (36.7 C)   Resp 16   Ht '5\' 2"'$  (1.575 m)   Wt 164 lb 11.2 oz (74.7 kg)   SpO2 96%   BMI 30.12 kg/m  Wt Readings from Last 3 Encounters:  10/17/22 164 lb 11.2 oz (74.7 kg)  10/16/22 169 lb 12.1 oz (77 kg)  10/08/22 169 lb 12.8 oz (77 kg)    Physical Exam Vitals reviewed.  Constitutional:      General: She is not in acute distress.    Appearance: Normal appearance.  HENT:     Head: Normocephalic and atraumatic.     Right Ear: External ear normal.     Left Ear: External ear normal.  Eyes:     General: No scleral icterus.       Right eye: No discharge.        Left eye: No discharge.     Conjunctiva/sclera: Conjunctivae normal.  Neck:     Thyroid: No thyromegaly.  Cardiovascular:     Rate and Rhythm: Normal rate and regular rhythm.  Pulmonary:     Effort: No respiratory distress.     Breath sounds: Normal breath sounds. No wheezing.  Abdominal:     General: Bowel sounds are normal.     Palpations: Abdomen is soft.     Tenderness: There is no abdominal tenderness.  Musculoskeletal:        General: No swelling or tenderness.     Cervical back: Neck supple. No tenderness.  Lymphadenopathy:     Cervical: No cervical adenopathy.  Skin:    Findings: No erythema or rash.  Neurological:     Mental Status: She is alert.  Psychiatric:        Mood and Affect: Mood normal.        Behavior: Behavior normal.      Outpatient Encounter Medications as of 10/17/2022  Medication Sig   albuterol (VENTOLIN HFA) 108 (90 Base) MCG/ACT  inhaler INHALE 2 PUFFS INTO THE LUNGS EVERY 6 HOURS AS NEEDED FOR WHEEZING OR SHORTNESS OF BREATH   aluminum-magnesium hydroxide-simethicone (MAALOX) 200-200-20 MG/5ML SUSP Take 15 mLs by mouth 4 (four) times daily -  before meals and at bedtime.    amLODipine (NORVASC) 10 MG tablet TAKE 1 TABLET BY MOUTH DAILY   aspirin 81 MG EC tablet Take 81 mg by mouth daily. Every other day   atorvastatin (LIPITOR) 20 MG tablet TAKE 1 TABLET BY MOUTH AT BEDTIME   benazepril (LOTENSIN) 40 MG tablet TAKE 1 TABLET BY MOUTH DAILY   betamethasone dipropionate 0.05 % cream Apply 1 application topically 2 (two) times daily.   busPIRone (BUSPAR) 10 MG tablet Take 10 mg by mouth 3 (three) times daily.   calcium citrate-vitamin D (CITRACAL+D) 315-200 MG-UNIT tablet Take 2 tablets by mouth daily.   Cholecalciferol (VITAMIN D3) 1000 UNITS CAPS Take 1 capsule by mouth daily.   clobetasol cream (TEMOVATE) [id number]-123456789 % Apply 1 application topically See admin instructions. Apply to affected areas of body 1 - 2 times daily as needed for itchy bumps. Avoid applying to face, groin, and axilla. Use as directed.   cyanocobalamin (VITAMIN B12) 1000 MCG/ML injection Inject 1 mL (1,000 mcg total) into the muscle every 3 (three) months.   Diclofenac Sodium 3 % GEL Apply topically 2 (two) times daily.   Docusate Sodium (DSS) 100 MG CAPS Take 100 mg by mouth 2 (two) times daily.   DULoxetine (CYMBALTA) 60 MG capsule Take 60 mg by mouth daily.   Fluocinolone  Acetonide 0.01 % OIL Apply 1-2 drops into ears once to twice daily as needed.   fluocinonide (LIDEX) 0.05 % external solution Apply topically 2 (two) times daily. Apply drops to scalp for itch   fluticasone (FLONASE) 50 MCG/ACT nasal spray Place 2 sprays into both nostrils daily.   furosemide (LASIX) 20 MG tablet TAKE 1 TABLET BY MOUTH DAILY AS NEEDED   gabapentin (NEURONTIN) 300 MG capsule Take two tablets tid (in am, midday and q hs).   HYDROcodone-acetaminophen (NORCO) 10-325 MG  tablet Take 1 tablet by mouth 3 (three) times daily.   Infant Care Products [facility]) OINT Apply topically as directed (as discussed).   ipratropium (ATROVENT) 0.03 % nasal spray Place 2 sprays into both nostrils every 12 (twelve) hours.   ketoconazole (NIZORAL) 2 % shampoo Apply 1 Application topically See admin instructions. apply three times per week, massage into scalp and leave in for 10 minutes before rinsing out   levalbuterol (XOPENEX) 0.63 MG/3ML nebulizer solution Take 3 mLs (0.63 mg total) by nebulization every 6 (six) hours as needed for wheezing or shortness of breath.   LINZESS 145 MCG CAPS capsule Take 145 mcg by mouth daily.   magnesium oxide (MAG-OX) 400 MG tablet Take 1 tablet (400 mg total) by mouth daily.   metoprolol succinate (TOPROL-XL) 50 MG 24 hr tablet TAKE 1 TABLET BY MOUTH DAILY. TAKE WITH OR IMMEDIATELY FOLLOWING A MEAL.   morphine (MS CONTIN) 30 MG 12 hr tablet Take 30 mg by mouth every 12 (twelve) hours.   morphine (MS CONTIN) 60 MG 12 hr tablet Take 1 tablet (60 mg total) by mouth 2 (two) times daily.   mupirocin ointment (BACTROBAN) 2 % Apply 1 Application topically 2 (two) times daily.   nystatin (MYCOSTATIN/NYSTOP) powder Apply 1 application. topically 2 (two) times daily as needed.   nystatin cream (MYCOSTATIN) Apply 1 Application topically 2 (two) times daily.   polyethylene glycol powder (GLYCOLAX/MIRALAX) 17 GM/SCOOP powder MIX 17 GRAMS AS MARKED ON BOTTLE TOP IN 8 OUNCES OF WATER AND DRINK ONCE A DAY AS DIRECTED.   RABEprazole (ACIPHEX) 20 MG tablet TAKE 1 TABLET BY MOUTH TWICE A DAY BEFORE A MEAL   Simethicone (GAS-X PO) Take 2 tablets by mouth 2 (two) times daily.   sodium chloride (OCEAN) 0.65 % nasal spray Place 1 spray into the nose as needed.   SYNTHROID 100 MCG tablet Take 1 tablet (100 mcg total) by mouth daily.   triamcinolone ointment (KENALOG) 0.1 % APPLY TWICE DAILY TO BITES AND RASH UNTIL FLAT AND SMOOTH **DO NOT APPLY TO FACE**    [DISCONTINUED] doxycycline (VIBRA-TABS) 100 MG tablet Take 1 tablet (100 mg total) by mouth 2 (two) times daily. (Patient not taking: Reported on 10/08/2022)   [DISCONTINUED] methylPREDNISolone (MEDROL DOSEPAK) 4 MG TBPK tablet Take 4 mg by mouth. Taper pak   [DISCONTINUED] Naldemedine Tosylate 0.2 MG TABS Take 0.2 mg by mouth daily.   [DISCONTINUED] predniSONE (DELTASONE) 10 MG tablet Take 4 tablets x 1 day and then decrease by 1/2 tablet per day until down to zero mg. (Patient not taking: Reported on 10/08/2022)   [DISCONTINUED] senna (SENOKOT) 8.6 MG TABS tablet Take 1 tablet by mouth 2 (two) times daily.   No facility-administered encounter medications on file as of 10/17/2022.     Lab Results  Component Value Date   WBC 6.8 10/16/2022   HGB 11.1 (L) 10/16/2022   HCT 33.7 (L) 10/16/2022   PLT 191 10/16/2022   GLUCOSE 95 10/16/2022  CHOL 143 01/21/2022   TRIG 108.0 01/21/2022   HDL 57.90 01/21/2022   LDLCALC 63 01/21/2022   ALT 13 10/16/2022   AST 20 10/16/2022   NA 138 10/16/2022   K 3.9 10/16/2022   CL 102 10/16/2022   CREATININE 0.64 10/16/2022   BUN 13 10/16/2022   CO2 27 10/16/2022   TSH 4.63 01/21/2022   INR 0.89 08/06/2018   HGBA1C 6.0 10/19/2014    CT Abdomen Pelvis W Contrast  Result Date: 10/16/2022 CLINICAL DATA:  Diarrhea and vomiting for a week. Abdominal pain remote history of C diff colitis EXAM: CT ABDOMEN AND PELVIS WITH CONTRAST TECHNIQUE: Multidetector CT imaging of the abdomen and pelvis was performed using the standard protocol following bolus administration of intravenous contrast. RADIATION DOSE REDUCTION: This exam was performed according to the departmental dose-optimization program which includes automated exposure control, adjustment of the mA and/or kV according to patient size and/or use of iterative reconstruction technique. CONTRAST:  171m OMNIPAQUE IOHEXOL 300 MG/ML  SOLN COMPARISON:  06/13/2021 FINDINGS: Lower chest: Heart is enlarged. Coronary  artery calcifications are seen. Calcifications as well along the proximal aorta in the aortic valve region. There are areas of scarring and atelectatic changes along the lung bases with bronchiectasis. Is also some noncalcified basilar lung nodules. Example right lower lobe posteriorly series 4, image 13 measuring 7 mm and left lower lobe image 36 measuring 7 mm. Additional smaller focus just anterior to this on image 31. These were seen on the more recent chest CT of 07/07/2022. these are also present going back to a CT scan of the abdomen pelvis from 04/30/2018 demonstrating long-term stability. No specific additional follow-up. Hepatobiliary: Gallbladder is distended. There is biliary duct dilatation diffusely the of the ampulla with nodular area along the distal common duct as seen on sagittal image 63 of series 6, axial image 28 of series 2 and coronal image 49 of series 5. Filling defect or mass is possible recommend further evaluation. No separate intrahepatic space-occupying lesion. Portal vein is patent. Pancreas: Global pancreatic atrophy without obvious duct dilatation or enhancing mass. Spleen: Tiny low-attenuation lesion seen in the superior margin of the spleen on series 2, image 16 is stable going back 2019 may represent a benign cysts. No additional imaging follow-up. Adrenals/Urinary Tract: Minimal thickening of left adrenal gland is stable. There is also a right adrenal nodule with some central macroscopic fat consistent with a myelolipoma. This has been previously described. Mild bilateral renal atrophy. No clear enhancing renal mass or collecting system dilatation. Prominent renal sinus fat. The ureters have normal course and caliber down to the normal caliber bladder. There are multiple Bosniak 1 right-sided renal cysts. Largest is seen posterior from the lower pole measuring 4.7 by 3.8 cm in diameter with Hounsfield unit of 13. Few tiny areas elsewhere in each kidney as well. No specific  imaging follow-up. Stomach/Bowel: On this non oral contrast exam, large bowel has a normal course and caliber with moderate colonic stool. Slightly redundant course to the sigmoid colon with diverticula. The appendix is not clearly seen in the right lower quadrant but no pericecal stranding or fluid. Stomach is relatively collapsed. Small bowel is nondilated. Vascular/Lymphatic: Normal caliber aorta and IVC with scattered vascular calcifications. Only mildly tortuous course of the aorta. No specific abnormal lymph node enlargement present in the abdomen and pelvis. Reproductive: Status post hysterectomy. No adnexal masses. Other: Small fat containing umbilical hernia. Musculoskeletal: There is a battery pack seen in the subcutaneous tissues posterior  to the upper right hemipelvis with lead extending superior towards the spine but does not enter the spine in the visualized field of view. Osteopenia with extensive deformity of the skeleton, unchanged from previous with trabecular thickening. Right hip hardware identified. IMPRESSION: Development of dilatation of the biliary tree with filling defect suggested along distal common duct. Mass lesion is not excluded recommend further evaluation. Aggressive lesion is in the differential. No bowel obstruction, free air or free fluid. Right adrenal myelolipoma. Sigmoid colon diverticula Electronically Signed   By: Jill Side M.D.   On: 10/16/2022 11:09       Assessment & Plan:  Anemia, unspecified type Assessment & Plan: Saw Dr Rogue Bussing 04/12/22 - previous rash to IV venofer.  Oral iron daily.  B12 injections.  Follow cbc.    Aortic atherosclerosis (HCC) Assessment & Plan: Continue lipitor.    Aortic valve stenosis, etiology of cardiac valve disease unspecified Assessment & Plan: Followed by cardiology.  Request appt for f/u with Dr Saralyn Pilar.    B12 deficiency Assessment & Plan: B12 injections (per hematology).  Given 06/04/22 - recommended q 3  months.    Congestive heart failure, unspecified HF chronicity, unspecified heart failure type ([place]) Assessment & Plan: ECHO 60% with G1DD.  No evidence of volume overload on exam.  Continue daily lasix.  Follow metabolic panel.    Chronic diastolic CHF (congestive heart failure) (HCC) Assessment & Plan: Euvolemic clinically.  Continue benazepril, metoprolol and lasix.    Chronic respiratory failure with hypoxia (HCC) Assessment & Plan: Uses oxygen at night.  Continue oxygen - nocturnal.       Chronic venous insufficiency Assessment & Plan: Continue compression hose.  Swelling overall improved.    Gastroesophageal reflux disease, unspecified whether esophagitis present Assessment & Plan: Continue aciphex.  Followed by GI.    Hyperlipidemia, unspecified hyperlipidemia type Assessment & Plan: On lipitor.  Low cholesterol diet and exercise.  Follow lipid panel and liver function tests.     Primary hypertension Assessment & Plan: Blood pressure as outlined.  Continue benazepril, amlodipine, lasix and metoprolol.  Follow pressures.  Follow metabolic panel.    Postinfectious hypothyroidism Assessment & Plan: On thyroid replacement.  Follow tsh.    Neuropathy Assessment & Plan: Continues on gabapentin.  Follow.    Pulmonary hypertension ([place]) Assessment & Plan: Followed by pulmonary.  Continue nighttime oxygen.    Abnormal CT of the abdomen Assessment & Plan: Was seen yesterday - ER - persistent diarrhea and weakness and abdominal pain.  CT yesterday - Development of dilatation of the biliary tree with filling defect suggested along distal common duct. Mass lesion is not excluded recommend further evaluation. Overall feeling better.  Appetite improving.  Ate small amount this am.  No nausea, vomiting or diarrhea now.  Continue bland diet and advance as tolerated.  Follow closely.  F/u with GI regarding further w/up - abnormal CT.       Einar Pheasant, MD

## 2022-10-21 ENCOUNTER — Other Ambulatory Visit
Admission: RE | Admit: 2022-10-21 | Discharge: 2022-10-21 | Disposition: A | Discharge status: Home / Self Care | Payer: Medicare HMO | Source: Ambulatory Visit | Attending: Emergency Medicine | Admitting: Emergency Medicine

## 2022-10-21 DIAGNOSIS — R531 Weakness: Secondary | ICD-10-CM | POA: Diagnosis not present

## 2022-10-21 DIAGNOSIS — R197 Diarrhea, unspecified: Secondary | ICD-10-CM | POA: Insufficient documentation

## 2022-10-21 LAB — GASTROINTESTINAL PANEL BY PCR, STOOL (REPLACES STOOL CULTURE)

## 2022-10-21 LAB — C DIFFICILE QUICK SCREEN W PCR REFLEX
C Diff antigen: NEGATIVE
C Diff interpretation: NOT DETECTED
C Diff toxin: NEGATIVE

## 2022-10-23 DIAGNOSIS — R935 Abnormal findings on diagnostic imaging of other abdominal regions, including retroperitoneum: Secondary | ICD-10-CM | POA: Diagnosis not present

## 2022-10-27 ENCOUNTER — Encounter: Payer: Self-pay | Admitting: Internal Medicine

## 2022-10-27 DIAGNOSIS — R935 Abnormal findings on diagnostic imaging of other abdominal regions, including retroperitoneum: Secondary | ICD-10-CM | POA: Insufficient documentation

## 2022-10-27 NOTE — Assessment & Plan Note (Signed)
Continues on gabapentin.  Follow.  

## 2022-10-27 NOTE — Assessment & Plan Note (Signed)
Continue aciphex.  Followed by GI.  °

## 2022-10-27 NOTE — Assessment & Plan Note (Signed)
ECHO 60% with G1DD.  No evidence of volume overload on exam.  Continue daily lasix.  Follow metabolic panel.  

## 2022-10-27 NOTE — Assessment & Plan Note (Signed)
Was seen yesterday - ER - persistent diarrhea and weakness and abdominal pain.  CT yesterday - Development of dilatation of the biliary tree with filling defect suggested along distal common duct. Mass lesion is not excluded recommend further evaluation. Overall feeling better.  Appetite improving.  Ate small amount this am.  No nausea, vomiting or diarrhea now.  Continue bland diet and advance as tolerated.  Follow closely.  F/u with GI regarding further w/up - abnormal CT.

## 2022-10-27 NOTE — Assessment & Plan Note (Signed)
B12 injections (per hematology).  Given 06/04/22 - recommended q 3 months.  

## 2022-10-27 NOTE — Assessment & Plan Note (Signed)
Continue compression hose.  Swelling overall improved.  

## 2022-10-27 NOTE — Assessment & Plan Note (Signed)
Blood pressure as outlined.  Continue benazepril, amlodipine, lasix and metoprolol.  Follow pressures.  Follow metabolic panel.  

## 2022-10-27 NOTE — Assessment & Plan Note (Signed)
Continue lipitor  ?

## 2022-10-27 NOTE — Assessment & Plan Note (Signed)
On thyroid replacement.  Follow tsh.  

## 2022-10-27 NOTE — Assessment & Plan Note (Signed)
Followed by cardiology.  Request appt for f/u with Dr Saralyn Pilar.

## 2022-10-27 NOTE — Assessment & Plan Note (Signed)
Saw Dr Brahmanday 04/12/22 - previous rash to IV venofer.  Oral iron daily.  B12 injections.  Follow cbc.  

## 2022-10-27 NOTE — Assessment & Plan Note (Signed)
On lipitor.  Low cholesterol diet and exercise.  Follow lipid panel and liver function tests.   

## 2022-10-27 NOTE — Assessment & Plan Note (Signed)
Followed by pulmonary.  Continue nighttime oxygen.  

## 2022-10-27 NOTE — Assessment & Plan Note (Signed)
Uses oxygen at night.  Continue oxygen - nocturnal.     

## 2022-10-27 NOTE — Assessment & Plan Note (Signed)
Euvolemic clinically.  Continue benazepril, metoprolol and lasix.  

## 2022-10-30 ENCOUNTER — Ambulatory Visit: Payer: Medicare HMO | Admitting: Student in an Organized Health Care Education/Training Program

## 2022-10-30 ENCOUNTER — Encounter: Payer: Self-pay | Admitting: Student in an Organized Health Care Education/Training Program

## 2022-10-30 VITALS — BP 128/72 | HR 61 | Temp 98.2°F | Ht 62.0 in | Wt 167.0 lb

## 2022-10-30 DIAGNOSIS — J4530 Mild persistent asthma, uncomplicated: Secondary | ICD-10-CM

## 2022-10-30 DIAGNOSIS — R0602 Shortness of breath: Secondary | ICD-10-CM | POA: Diagnosis not present

## 2022-10-30 MED ORDER — BUDESONIDE-FORMOTEROL FUMARATE 80-4.5 MCG/ACT IN AERO: 2.0000 | INHALATION_SPRAY | Freq: Two times a day (BID) | RESPIRATORY_TRACT | 12 refills | Status: DC

## 2022-10-30 NOTE — Progress Notes (Signed)
Synopsis: Referred in for shortness of breath by Einar Pheasant, MD  Assessment & Plan:   #Mild persistent asthma without complication #Shortness of breath  She is presenting for the evaluation of shortness of breath that appears to have worsened over the last couple of months after 2 hospitalizations (1 in November for rhinovirus, 1 in January for influenza).  On physical exam, she has no wheezing but does have some Rales over the bibasilar bases with notable lower extremity pitting edema to the level of the knees.  Imaging in November showed mosaic attenuation and multifocal airspace consolidations is consistent with a combination of heart failure as well as her viral infection at that time.  I reviewed her previous high-resolution CT scan of the chest and it does show mosaic attenuation that is worsened on exhalation consistent with small airway disease/air trapping.  It is my impression that her shortness of breath is multifactorial from reactive airway disease, heart failure with preserved ejection fraction, and restrictive lung disease secondary to her kyphosis.  I will restart her Symbicort to be taken twice daily and I have counseled her on the need to increase her diuretic dose.  She will reach out to her cardiology team in order to improve her diuresis.  - budesonide-formoterol (SYMBICORT) 80-4.5 MCG/ACT inhaler; Inhale 2 puffs into the lungs in the morning and at bedtime.  Dispense: 1 each; Refill: 12   Return in about 6 months (around 04/30/2023).  I spent 60 minutes caring for this patient today, including preparing to see the patient, obtaining a medical history , reviewing a separately obtained history, performing a medically appropriate examination and/or evaluation, counseling and educating the patient/family/caregiver, ordering medications, tests, or procedures, documenting clinical information in the electronic health record, and independently interpreting results (not  separately reported/billed) and communicating results to the patient/family/caregiver  Armando Reichert, MD [name] 10/30/2022 5:37 PM    End of visit medications:  Meds ordered this encounter  Medications   budesonide-formoterol (SYMBICORT) 80-4.5 MCG/ACT inhaler    Sig: Inhale 2 puffs into the lungs in the morning and at bedtime.    Dispense:  1 each    Refill:  12     Current Outpatient Medications:    albuterol (VENTOLIN HFA) 108 (90 Base) MCG/ACT inhaler, INHALE 2 PUFFS INTO THE LUNGS EVERY 6 HOURS AS NEEDED FOR WHEEZING OR SHORTNESS OF BREATH, Disp: 18 g, Rfl: 0   aluminum-magnesium hydroxide-simethicone (MAALOX) [id number] MG/5ML SUSP, Take 15 mLs by mouth 4 (four) times daily -  before meals and at bedtime. , Disp: , Rfl:    amLODipine (NORVASC) 10 MG tablet, TAKE 1 TABLET BY MOUTH DAILY, Disp: 90 tablet, Rfl: 1   aspirin 81 MG EC tablet, Take 81 mg by mouth daily. Every other day, Disp: , Rfl:    atorvastatin (LIPITOR) 20 MG tablet, TAKE 1 TABLET BY MOUTH AT BEDTIME, Disp: 90 tablet, Rfl: 1   benazepril (LOTENSIN) 40 MG tablet, TAKE 1 TABLET BY MOUTH DAILY, Disp: 90 tablet, Rfl: 1   betamethasone dipropionate 0.05 % cream, Apply 1 application topically 2 (two) times daily., Disp: , Rfl:    budesonide-formoterol (SYMBICORT) 80-4.5 MCG/ACT inhaler, Inhale 2 puffs into the lungs in the morning and at bedtime., Disp: 1 each, Rfl: 12   busPIRone (BUSPAR) 10 MG tablet, Take 10 mg by mouth 3 (three) times daily., Disp: , Rfl:    calcium citrate-vitamin D (CITRACAL+D) 315-200 MG-UNIT tablet, Take 2 tablets by mouth daily., Disp: , Rfl:  Cholecalciferol (VITAMIN D3) 1000 UNITS CAPS, Take 1 capsule by mouth daily., Disp: , Rfl:    clobetasol cream (TEMOVATE) [id number] %, Apply 1 application topically See admin instructions. Apply to affected areas of body 1 - 2 times daily as needed for itchy bumps. Avoid applying to face, groin, and axilla. Use as directed., Disp: 60 g,  Rfl: 1   cyanocobalamin (VITAMIN B12) 1000 MCG/ML injection, Inject 1 mL (1,000 mcg total) into the muscle every 3 (three) months., Disp: 12 mL, Rfl: 0   Diclofenac Sodium 3 % GEL, Apply topically 2 (two) times daily., Disp: , Rfl:    Docusate Sodium (DSS) 100 MG CAPS, Take 100 mg by mouth 2 (two) times daily., Disp: , Rfl:    DULoxetine (CYMBALTA) 60 MG capsule, Take 60 mg by mouth daily., Disp: , Rfl:    Fluocinolone Acetonide 0.01 % OIL, Apply 1-2 drops into ears once to twice daily as needed., Disp: 20 mL, Rfl: 3   fluocinonide (LIDEX) 0.05 % external solution, Apply topically 2 (two) times daily. Apply drops to scalp for itch, Disp: 60 mL, Rfl: 3   fluticasone (FLONASE) 50 MCG/ACT nasal spray, Place 2 sprays into both nostrils daily., Disp: 16 g, Rfl: 6   furosemide (LASIX) 20 MG tablet, TAKE 1 TABLET BY MOUTH DAILY AS NEEDED, Disp: 90 tablet, Rfl: 0   gabapentin (NEURONTIN) 300 MG capsule, Take two tablets tid (in am, midday and q hs)., Disp: 540 capsule, Rfl: 1   HYDROcodone-acetaminophen (NORCO) 10-325 MG tablet, Take 1 tablet by mouth 3 (three) times daily., Disp: 10 tablet, Rfl: 0   Infant Care Products (DERMACLOUD) OINT, Apply topically as directed (as discussed)., Disp: 430 g, Rfl: 1   ipratropium (ATROVENT) 0.03 % nasal spray, Place 2 sprays into both nostrils every 12 (twelve) hours., Disp: 30 mL, Rfl: 1   ketoconazole (NIZORAL) 2 % shampoo, Apply 1 Application topically See admin instructions. apply three times per week, massage into scalp and leave in for 10 minutes before rinsing out, Disp: 120 mL, Rfl: 3   levalbuterol (XOPENEX) 0.63 MG/3ML nebulizer solution, Take 3 mLs (0.63 mg total) by nebulization every 6 (six) hours as needed for wheezing or shortness of breath., Disp: 3 mL, Rfl: 1   LINZESS 145 MCG CAPS capsule, Take 145 mcg by mouth daily., Disp: , Rfl:    magnesium oxide (MAG-OX) 400 MG tablet, Take 1 tablet (400 mg total) by mouth daily., Disp: 30 tablet, Rfl: 1    metoprolol succinate (TOPROL-XL) 50 MG 24 hr tablet, TAKE 1 TABLET BY MOUTH DAILY. TAKE WITH OR IMMEDIATELY FOLLOWING A MEAL., Disp: 90 tablet, Rfl: 1   morphine (MS CONTIN) 30 MG 12 hr tablet, Take 30 mg by mouth every 12 (twelve) hours., Disp: , Rfl:    morphine (MS CONTIN) 60 MG 12 hr tablet, Take 1 tablet (60 mg total) by mouth 2 (two) times daily., Disp: 10 tablet, Rfl: 0   mupirocin ointment (BACTROBAN) 2 %, Apply 1 Application topically 2 (two) times daily., Disp: 22 g, Rfl: 0   nystatin (MYCOSTATIN/NYSTOP) powder, Apply 1 application. topically 2 (two) times daily as needed., Disp: 60 g, Rfl: 0   nystatin cream (MYCOSTATIN), Apply 1 Application topically 2 (two) times daily., Disp: 60 g, Rfl: 0   polyethylene glycol powder (GLYCOLAX/MIRALAX) 17 GM/SCOOP powder, MIX 17 GRAMS AS MARKED ON BOTTLE TOP IN 8 OUNCES OF WATER AND DRINK ONCE A DAY AS DIRECTED., Disp: 527 g, Rfl: 0   RABEprazole (ACIPHEX) 20 MG  tablet, TAKE 1 TABLET BY MOUTH TWICE A DAY BEFORE A MEAL, Disp: 180 tablet, Rfl: 1   Simethicone (GAS-X PO), Take 2 tablets by mouth 2 (two) times daily., Disp: , Rfl:    sodium chloride (OCEAN) 0.65 % nasal spray, Place 1 spray into the nose as needed., Disp: , Rfl:    SYNTHROID 100 MCG tablet, Take 1 tablet (100 mcg total) by mouth daily., Disp: 90 tablet, Rfl: 3   triamcinolone ointment (KENALOG) 0.1 %, APPLY TWICE DAILY TO BITES AND RASH UNTIL FLAT AND SMOOTH **DO NOT APPLY TO FACE**, Disp: 80 g, Rfl: 0   Subjective:   PATIENT ID: Kimberly Roy GENDER: female DOB: 1931/06/07, MRN: [id number]  Chief Complaint  Patient presents with   New Patient (Initial Visit)    PNA 07/2022 and Flu 09/2022-SOB with exertion and non prod cough    HPI  Patient is a pleasant 89 year old female previously followed by Dr. Erin Fulling who presents to clinic for the evaluation of shortness of breath. Patient was last seen by Dr. Erin Fulling in August 2022 for asthma follow-up.  She is noted to have a history of HFpEF,  aortic stenosis, and kyphosis.    She was diagnosed with COVID in 2021 after which she appears to have developed reactive airway disease. Over the past few months, the patient had 2 hospitalizations for shortness of breath, with with viral panel returning positive for rhinovirus in November 2023.  She was also admitted to the hospital in January 2020 for with influenza A infection resulting in acute on chronic hypoxic respiratory failure.  She was treated with Tamiflu, oxygen, and nebulizers.  Patient returns to clinic today with a chief complaint of worsening shortness of breath over the past 2 months with an increase in cough that is not productive. The patient describes a sensation that she needs to bring up phlegm but is unable to.  She has no fevers, no chills, no chest pain, no night sweats, no wheezing, and no weight loss.  She has been using her nebulizers intermittently but does not use her inhalers, and has not been on Symbicort for quite a while now.  She follows with [facility] cardiology and has been asked to monitor her weight to help guide diuresis.  Her diuretic dose has been unchanged.  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  Ancillary information including prior medications, full medical/surgical/family/social histories, and PFTs (when available) are listed below and have been reviewed.   Review of Systems  Constitutional:  Negative for chills, fever, malaise/fatigue and weight loss.  Respiratory:  Positive for cough and shortness of breath. Negative for hemoptysis and sputum production.   Cardiovascular:  Negative for chest pain.  Skin:  Negative for rash.     Objective:   Vitals:   10/30/22 1428  BP: 128/72  Pulse: 61  Temp: 98.2 F (36.8 C)  TempSrc: Temporal  SpO2: 96%  Weight: 167 lb (75.8 kg)  Height: '5\' 2"'$  (1.575 m)   96% on RA  BMI Readings from Last 3 Encounters:  10/30/22 30.54 kg/m  10/17/22 30.12 kg/m   10/16/22 31.05 kg/m   Wt Readings from Last 3 Encounters:  10/30/22 167 lb (75.8 kg)  10/17/22 164 lb 11.2 oz (74.7 kg)  10/16/22 169 lb 12.1 oz (77 kg)    Physical Exam Constitutional:      Appearance: She is not ill-appearing.  Neck:     Comments: kyphosis Cardiovascular:  Rate and Rhythm: Normal rate and regular rhythm.     Heart sounds: Murmur (systolic ejection, left upper sternal border) heard.  Pulmonary:     Effort: Pulmonary effort is normal.     Breath sounds: Rales present. No wheezing or rhonchi.  Abdominal:     Palpations: Abdomen is soft.  Musculoskeletal:     Right lower leg: Edema present.     Left lower leg: Edema present.  Neurological:     General: No focal deficit present.     Mental Status: She is alert.    Ancillary Information    Past Medical History:  Diagnosis Date   Anemia    Anxiety    Chest pain    CHF (congestive heart failure) (HCC)    Constipation    DDD (degenerative disc disease), cervical    Depression    DVT (deep venous thrombosis) (HCC)    Dysphonia    Dyspnea    Fatty liver    Fatty liver    Headache    Hyperlipidemia    Hyperpiesia    Hypertension    Hypothyroidism    Interstitial cystitis    Left ventricular dysfunction    Lymphedema    Nephrolithiasis    Obstructive sleep apnea    Osteoarthritis    knees/cervical and lumbar spine   Pulmonary hypertension (HCC)    Pulmonary nodules    followed by Dr Raul Del   Pure hypercholesterolemia    Renal cyst    right     Family History  Problem Relation Age of Onset   Heart disease Mother    Stroke Mother    Hypertension Mother    Heart disease Father        myocardial infarction age 63   Breast cancer Neg Hx      Past Surgical History:  Procedure Laterality Date   ABDOMINAL HYSTERECTOMY     ovaries left in place   APPENDECTOMY     Back Surgeries     BACK SURGERY     BREAST REDUCTION SURGERY     3/99   CARDIAC CATHETERIZATION     cataracts  Bilateral    CERVICAL SPINE SURGERY     ESOPHAGEAL MANOMETRY N/A 08/02/2015   Procedure: ESOPHAGEAL MANOMETRY (EM);  Surgeon: Josefine Class, MD;  Location: [facility] ENDOSCOPY;  Service: Endoscopy;  Laterality: N/A;   ESOPHAGOGASTRODUODENOSCOPY N/A 02/27/2015   Procedure: ESOPHAGOGASTRODUODENOSCOPY (EGD);  Surgeon: Hulen Luster, MD;  [facility] ENDOSCOPY;  Service: Gastroenterology;  Laterality: N/A;   ESOPHAGOGASTRODUODENOSCOPY (EGD) WITH PROPOFOL N/A 10/30/2018   Procedure: ESOPHAGOGASTRODUODENOSCOPY (EGD) WITH PROPOFOL;  Surgeon: Lollie Sails, MD;  Location: [facility] ENDOSCOPY;  Service: Endoscopy;  Laterality: N/A;   EXCISIONAL HEMORRHOIDECTOMY     EYE SURGERY     FRACTURE SURGERY     HEMORRHOID SURGERY     HIP SURGERY  2013   Right hip surgery   JOINT REPLACEMENT     KNEE ARTHROSCOPY     left and right   ORIF FEMUR FRACTURE Right 08/07/2018   Procedure: OPEN REDUCTION INTERNAL FIXATION (ORIF) DISTAL FEMUR FRACTURE;  Surgeon: Hessie Knows, MD;  Location: [facility] ORS;  Service: Orthopedics;  Laterality: Right;   REDUCTION MAMMAPLASTY Bilateral YRS AGO   REPLACEMENT TOTAL KNEE Bilateral    rotator cuff surgery     blilateral   TONSILECTOMY/ADENOIDECTOMY WITH MYRINGOTOMY     VISCERAL ARTERY INTERVENTION N/A 02/25/2019   Procedure: VISCERAL ARTERY INTERVENTION;  Surgeon: Algernon Huxley, MD;  Location: [facility]  INVASIVE CV LAB;  Service: Cardiovascular;  Laterality: N/A;    Social History   Socioeconomic History   Marital status: Widowed    Spouse name: Not on file   Number of children: 2   Years of education: 47   Highest education level: 12th grade  Occupational History    Comment: insurance agency  Tobacco Use   Smoking status: Never   Smokeless tobacco: Never  Vaping Use   Vaping Use: Never used  Substance and Sexual Activity   Alcohol use: No    Alcohol/week: 0.0 standard drinks of alcohol   Drug use: Never   Sexual activity: Not Currently  Other Topics Concern   Not on  file  Social History Narrative   Not on file   Social Determinants of Health   Financial Resource Strain: Low Risk  (03/09/2021)   Overall Financial Resource Strain (CARDIA)    Difficulty of Paying Living Expenses: Not hard at all  Food Insecurity: No Food Insecurity (09/05/2022)   Hunger Vital Sign    Worried About Running Out of Food in the Last Year: Never true    [place] in the Last Year: Never true  Transportation Needs: Unmet Transportation Needs (09/05/2022)   PRAPARE - Transportation    Lack of Transportation (Medical): Yes    Lack of Transportation (Non-Medical): Yes  Physical Activity: Inactive (03/09/2021)   Exercise Vital Sign    Days of Exercise per Week: 0 days    Minutes of Exercise per Session: 0 min  Stress: No Stress Concern Present (03/09/2021)   [place]    Feeling of Stress : Only a little  Social Connections: Socially Isolated (11/15/2021)   Social Connection and Isolation Panel [NHANES]    Frequency of Communication with Friends and Family: Three times a week    Frequency of Social Gatherings with Friends and Family: Twice a week    Attends Religious Services: Never    Marine scientist or Organizations: No    Attends Archivist Meetings: Never    Marital Status: Widowed  Intimate Partner Violence: Not At Risk (07/12/2022)   Humiliation, Afraid, Rape, and Kick questionnaire    Fear of Current or Ex-Partner: No    Emotionally Abused: No    Physically Abused: No    Sexually Abused: No     Allergies  Allergen Reactions   Lyrica [Pregabalin] Swelling   Omnicef [Cefdinir] Diarrhea and Nausea And Vomiting   Atarax [Hydroxyzine]     jittery   Dicyclomine Other (See Comments)    Abdominal bloating    Hydroxyzine Hcl     jittery   Levaquin [Levofloxacin] Swelling   Nitrofurantoin Diarrhea   Nucynta Er [Tapentadol Hcl Er] Other (See Comments)    Severe constipation     Oxybutynin Other (See Comments)    Blurred vision   Zoloft [Sertraline Hcl]     Severe headache   Biaxin [Clarithromycin] Other (See Comments) and Rash    Pt does not remember Pt does not remember   Meloxicam Rash   Sertraline Nausea And Vomiting    Severe headache Severe headache Other reaction(s): Headache Severe headache   Silicone Other (See Comments) and Rash    Durabond - redness Durabond - redness    Durabond - redness   Sulfa Antibiotics Rash    Pt does not remember   Sulfasalazine Rash    Pt does not remember   Tape Rash  Durabond - redness   Tapentadol Other (See Comments) and Rash    Severe constipation Severe constipation Severe constipation Severe constipation Severe constipation   Venofer [Iron Sucrose] Rash     CBC    Component Value Date/Time   WBC 6.8 10/16/2022 0942   RBC 3.42 (L) 10/16/2022 0942   HGB 11.1 (L) 10/16/2022 0942   HGB 10.4 (L) 10/29/2019 0000   HCT 33.7 (L) 10/16/2022 0942   HCT 30.8 (L) 10/29/2019 0000   PLT 191 10/16/2022 0942   PLT 284 10/29/2019 0000   MCV 98.5 10/16/2022 0942   MCV 95 10/29/2019 0000   MCV 94 04/18/2014 0944   MCH 32.5 10/16/2022 0942   MCHC 32.9 10/16/2022 0942   RDW 13.5 10/16/2022 0942   RDW 12.5 10/29/2019 0000   RDW 14.8 (H) 04/18/2014 0944   LYMPHSABS 0.8 10/08/2022 1509   LYMPHSABS 2.0 10/29/2019 0000   LYMPHSABS 2.4 04/18/2014 0944   MONOABS 0.2 10/08/2022 1509   MONOABS 0.6 04/18/2014 0944   EOSABS 0.2 10/08/2022 1509   EOSABS 0.3 10/29/2019 0000   EOSABS 0.4 04/18/2014 0944   BASOSABS 0.0 10/08/2022 1509   BASOSABS 0.0 10/29/2019 0000   BASOSABS 0.0 04/18/2014 0944    Pulmonary Functions Testing Results:    Latest Ref Rng & Units 01/17/2020    1:10 PM  PFT Results  FVC-Pre L 1.71   FVC-Predicted Pre % 81   FVC-Post L 1.71   FVC-Predicted Post % 81   Pre FEV1/FVC % % 80   Post FEV1/FCV % % 83   FEV1-Pre L 1.36   FEV1-Predicted Pre % 88   FEV1-Post L 1.42   DLCO uncorrected  ml/min/mmHg 11.24   DLCO UNC% % 65   DLCO corrected ml/min/mmHg 12.35   DLCO COR %Predicted % 71   DLVA Predicted % 100   TLC L 3.62   TLC % Predicted % 76   RV % Predicted % 81     Outpatient Medications Prior to Visit  Medication Sig Dispense Refill   albuterol (VENTOLIN HFA) 108 (90 Base) MCG/ACT inhaler INHALE 2 PUFFS INTO THE LUNGS EVERY 6 HOURS AS NEEDED FOR WHEEZING OR SHORTNESS OF BREATH 18 g 0   aluminum-magnesium hydroxide-simethicone (MAALOX) [id number] MG/5ML SUSP Take 15 mLs by mouth 4 (four) times daily -  before meals and at bedtime.      amLODipine (NORVASC) 10 MG tablet TAKE 1 TABLET BY MOUTH DAILY 90 tablet 1   aspirin 81 MG EC tablet Take 81 mg by mouth daily. Every other day     atorvastatin (LIPITOR) 20 MG tablet TAKE 1 TABLET BY MOUTH AT BEDTIME 90 tablet 1   benazepril (LOTENSIN) 40 MG tablet TAKE 1 TABLET BY MOUTH DAILY 90 tablet 1   betamethasone dipropionate 0.05 % cream Apply 1 application topically 2 (two) times daily.     busPIRone (BUSPAR) 10 MG tablet Take 10 mg by mouth 3 (three) times daily.     calcium citrate-vitamin D (CITRACAL+D) 315-200 MG-UNIT tablet Take 2 tablets by mouth daily.     Cholecalciferol (VITAMIN D3) 1000 UNITS CAPS Take 1 capsule by mouth daily.     clobetasol cream (TEMOVATE) [id number] % Apply 1 application topically See admin instructions. Apply to affected areas of body 1 - 2 times daily as needed for itchy bumps. Avoid applying to face, groin, and axilla. Use as directed. 60 g 1   cyanocobalamin (VITAMIN B12) 1000 MCG/ML injection Inject 1 mL (1,000 mcg total) into the  muscle every 3 (three) months. 12 mL 0   Diclofenac Sodium 3 % GEL Apply topically 2 (two) times daily.     Docusate Sodium (DSS) 100 MG CAPS Take 100 mg by mouth 2 (two) times daily.     DULoxetine (CYMBALTA) 60 MG capsule Take 60 mg by mouth daily.     Fluocinolone Acetonide 0.01 % OIL Apply 1-2 drops into ears once to twice daily as needed. 20 mL 3   fluocinonide  (LIDEX) 0.05 % external solution Apply topically 2 (two) times daily. Apply drops to scalp for itch 60 mL 3   fluticasone (FLONASE) 50 MCG/ACT nasal spray Place 2 sprays into both nostrils daily. 16 g 6   furosemide (LASIX) 20 MG tablet TAKE 1 TABLET BY MOUTH DAILY AS NEEDED 90 tablet 0   gabapentin (NEURONTIN) 300 MG capsule Take two tablets tid (in am, midday and q hs). 540 capsule 1   HYDROcodone-acetaminophen (NORCO) 10-325 MG tablet Take 1 tablet by mouth 3 (three) times daily. 10 tablet 0   Infant Care Products [facility]) OINT Apply topically as directed (as discussed). 430 g 1   ipratropium (ATROVENT) 0.03 % nasal spray Place 2 sprays into both nostrils every 12 (twelve) hours. 30 mL 1   ketoconazole (NIZORAL) 2 % shampoo Apply 1 Application topically See admin instructions. apply three times per week, massage into scalp and leave in for 10 minutes before rinsing out 120 mL 3   levalbuterol (XOPENEX) 0.63 MG/3ML nebulizer solution Take 3 mLs (0.63 mg total) by nebulization every 6 (six) hours as needed for wheezing or shortness of breath. 3 mL 1   LINZESS 145 MCG CAPS capsule Take 145 mcg by mouth daily.     magnesium oxide (MAG-OX) 400 MG tablet Take 1 tablet (400 mg total) by mouth daily. 30 tablet 1   metoprolol succinate (TOPROL-XL) 50 MG 24 hr tablet TAKE 1 TABLET BY MOUTH DAILY. TAKE WITH OR IMMEDIATELY FOLLOWING A MEAL. 90 tablet 1   morphine (MS CONTIN) 30 MG 12 hr tablet Take 30 mg by mouth every 12 (twelve) hours.     morphine (MS CONTIN) 60 MG 12 hr tablet Take 1 tablet (60 mg total) by mouth 2 (two) times daily. 10 tablet 0   mupirocin ointment (BACTROBAN) 2 % Apply 1 Application topically 2 (two) times daily. 22 g 0   nystatin (MYCOSTATIN/NYSTOP) powder Apply 1 application. topically 2 (two) times daily as needed. 60 g 0   nystatin cream (MYCOSTATIN) Apply 1 Application topically 2 (two) times daily. 60 g 0   polyethylene glycol powder (GLYCOLAX/MIRALAX) 17 GM/SCOOP powder MIX  17 GRAMS AS MARKED ON BOTTLE TOP IN 8 OUNCES OF WATER AND DRINK ONCE A DAY AS DIRECTED. 527 g 0   RABEprazole (ACIPHEX) 20 MG tablet TAKE 1 TABLET BY MOUTH TWICE A DAY BEFORE A MEAL 180 tablet 1   Simethicone (GAS-X PO) Take 2 tablets by mouth 2 (two) times daily.     sodium chloride (OCEAN) 0.65 % nasal spray Place 1 spray into the nose as needed.     SYNTHROID 100 MCG tablet Take 1 tablet (100 mcg total) by mouth daily. 90 tablet 3   triamcinolone ointment (KENALOG) 0.1 % APPLY TWICE DAILY TO BITES AND RASH UNTIL FLAT AND SMOOTH **DO NOT APPLY TO FACE** 80 g 0   No facility-administered medications prior to visit.

## 2022-11-04 DIAGNOSIS — M25551 Pain in right hip: Secondary | ICD-10-CM | POA: Diagnosis not present

## 2022-11-04 DIAGNOSIS — T8484XD Pain due to internal orthopedic prosthetic devices, implants and grafts, subsequent encounter: Secondary | ICD-10-CM | POA: Diagnosis not present

## 2022-11-04 DIAGNOSIS — M25561 Pain in right knee: Secondary | ICD-10-CM | POA: Diagnosis not present

## 2022-11-04 DIAGNOSIS — Z96653 Presence of artificial knee joint, bilateral: Secondary | ICD-10-CM | POA: Diagnosis not present

## 2022-11-05 DIAGNOSIS — M25512 Pain in left shoulder: Secondary | ICD-10-CM | POA: Diagnosis not present

## 2022-11-05 DIAGNOSIS — M25511 Pain in right shoulder: Secondary | ICD-10-CM | POA: Diagnosis not present

## 2022-11-06 ENCOUNTER — Encounter: Payer: Self-pay | Admitting: Internal Medicine

## 2022-11-07 ENCOUNTER — Telehealth: Payer: Self-pay

## 2022-11-07 NOTE — Telephone Encounter (Signed)
Levada Dy states she is following up on an order from January that was missed for resumption of care after patient's hospital visit.  Date of order was 09/06/2022.  Levada Dy states it was re-faxed on 10/28/2022.  Levada Dy states she will fax it to [place] again today.

## 2022-11-08 NOTE — Telephone Encounter (Signed)
Orders placed in Dr Lars Mage folder for signature

## 2022-11-12 DIAGNOSIS — K5903 Drug induced constipation: Secondary | ICD-10-CM | POA: Diagnosis not present

## 2022-11-12 DIAGNOSIS — Z79899 Other long term (current) drug therapy: Secondary | ICD-10-CM | POA: Diagnosis not present

## 2022-11-12 DIAGNOSIS — S76312A Strain of muscle, fascia and tendon of the posterior muscle group at thigh level, left thigh, initial encounter: Secondary | ICD-10-CM | POA: Diagnosis not present

## 2022-11-12 DIAGNOSIS — M19011 Primary osteoarthritis, right shoulder: Secondary | ICD-10-CM | POA: Diagnosis not present

## 2022-11-12 DIAGNOSIS — Q675 Congenital deformity of spine: Secondary | ICD-10-CM | POA: Diagnosis not present

## 2022-11-12 DIAGNOSIS — M48061 Spinal stenosis, lumbar region without neurogenic claudication: Secondary | ICD-10-CM | POA: Diagnosis not present

## 2022-11-12 DIAGNOSIS — M5416 Radiculopathy, lumbar region: Secondary | ICD-10-CM | POA: Diagnosis not present

## 2022-11-12 DIAGNOSIS — M519 Unspecified thoracic, thoracolumbar and lumbosacral intervertebral disc disorder: Secondary | ICD-10-CM | POA: Diagnosis not present

## 2022-11-12 DIAGNOSIS — M47812 Spondylosis without myelopathy or radiculopathy, cervical region: Secondary | ICD-10-CM | POA: Diagnosis not present

## 2022-11-13 ENCOUNTER — Telehealth: Payer: Self-pay

## 2022-11-13 NOTE — Telephone Encounter (Signed)
Patient called asking for previous medications that was used for ears. She states she went for a hearing test today and they tested her ear for an infection due to currently flare. Patient advised if she does have a skin infection to please call me back and we would try our best to get her in sooner then 04/03. aw

## 2022-11-15 ENCOUNTER — Telehealth: Payer: Self-pay | Admitting: Internal Medicine

## 2022-11-15 NOTE — Telephone Encounter (Signed)
Mirica RN from [facility] called in staying that pt had a start date for service on 3/20, however, due to pt having some appts coming up that need a new restart date of service for pt. Any questions or concern, she's available @[phone].

## 2022-11-17 NOTE — Telephone Encounter (Signed)
Noted.  Let me know if I need to do anything.  ?

## 2022-11-18 ENCOUNTER — Other Ambulatory Visit: Payer: Self-pay | Admitting: Internal Medicine

## 2022-11-18 DIAGNOSIS — F419 Anxiety disorder, unspecified: Secondary | ICD-10-CM | POA: Diagnosis not present

## 2022-11-18 DIAGNOSIS — F330 Major depressive disorder, recurrent, mild: Secondary | ICD-10-CM | POA: Diagnosis not present

## 2022-11-18 DIAGNOSIS — F5105 Insomnia due to other mental disorder: Secondary | ICD-10-CM | POA: Diagnosis not present

## 2022-11-25 ENCOUNTER — Ambulatory Visit (INDEPENDENT_AMBULATORY_CARE_PROVIDER_SITE_OTHER): Payer: Medicare HMO

## 2022-11-25 VITALS — Ht 62.0 in | Wt 167.0 lb

## 2022-11-25 DIAGNOSIS — Z1231 Encounter for screening mammogram for malignant neoplasm of breast: Secondary | ICD-10-CM

## 2022-11-25 DIAGNOSIS — Z0000 Encounter for general adult medical examination without abnormal findings: Secondary | ICD-10-CM | POA: Diagnosis not present

## 2022-11-25 NOTE — Patient Instructions (Addendum)
Kimberly Roy , Thank you for taking time to come for your Medicare Wellness Visit. I appreciate your ongoing commitment to your health goals. Please review the following plan we discussed and let me know if I can assist you in the future.   These are the goals we discussed:  Goals Addressed               This Visit's Progress     Patient Stated     I plan to start walking for exercise (pt-stated)        Once the weather is better       Other     COMPLETED: Increase physical activity        Water exercises up to 2 days a week , 30 minutes each session          This is a list of the screening recommended for you and due dates:  Health Maintenance  Topic Date Due   Pneumonia Vaccine (2 of 2 - PPSV23 or PCV20) 10/18/2023*   Mammogram  03/20/2023   Medicare Annual Wellness Visit  11/25/2023   DTaP/Tdap/Td vaccine (2 - Td or Tdap) 09/18/2027   Flu Shot  Completed   DEXA scan (bone density measurement)  Completed   Zoster (Shingles) Vaccine  Completed   HPV Vaccine  Aged Out   COVID-19 Vaccine  Discontinued  *Topic was postponed. The date shown is not the original due date.    Advanced directives: on file  Conditions/risks identified: none new  Next appointment: Follow up in one year for your annual wellness visit    Preventive Care 65 Years and Older, Female Preventive care refers to lifestyle choices and visits with your health care provider that can promote health and wellness. What does preventive care include? A yearly physical exam. This is also called an annual well check. Dental exams once or twice a year. Routine eye exams. Ask your health care provider how often you should have your eyes checked. Personal lifestyle choices, including: Daily care of your teeth and gums. Regular physical activity. Eating a healthy diet. Avoiding tobacco and drug use. Limiting alcohol use. Practicing safe sex. Taking low-dose aspirin every day. Taking vitamin and mineral  supplements as recommended by your health care provider. What happens during an annual well check? The services and screenings done by your health care provider during your annual well check will depend on your age, overall health, lifestyle risk factors, and family history of disease. Counseling  Your health care provider may ask you questions about your: Alcohol use. Tobacco use. Drug use. Emotional well-being. Home and relationship well-being. Sexual activity. Eating habits. History of falls. Memory and ability to understand (cognition). Work and work Statistician. Reproductive health. Screening  You may have the following tests or measurements: Height, weight, and BMI. Blood pressure. Lipid and cholesterol levels. These may be checked every 5 years, or more frequently if you are over 29 years old. Skin check. Lung cancer screening. You may have this screening every year starting at age 1 if you have a 30-pack-year history of smoking and currently smoke or have quit within the past 15 years. Fecal occult blood test (FOBT) of the stool. You may have this test every year starting at age 37. Flexible sigmoidoscopy or colonoscopy. You may have a sigmoidoscopy every 5 years or a colonoscopy every 10 years starting at age 44. Hepatitis C blood test. Hepatitis B blood test. Sexually transmitted disease (STD) testing. Diabetes screening. This is done  by checking your blood sugar (glucose) after you have not eaten for a while (fasting). You may have this done every 1-3 years. Bone density scan. This is done to screen for osteoporosis. You may have this done starting at age 95. Mammogram. This may be done every 1-2 years. Talk to your health care provider about how often you should have regular mammograms. Talk with your health care provider about your test results, treatment options, and if necessary, the need for more tests. Vaccines  Your health care provider may recommend certain  vaccines, such as: Influenza vaccine. This is recommended every year. Tetanus, diphtheria, and acellular pertussis (Tdap, Td) vaccine. You may need a Td booster every 10 years. Zoster vaccine. You may need this after age 50. Pneumococcal 13-valent conjugate (PCV13) vaccine. One dose is recommended after age 83. Pneumococcal polysaccharide (PPSV23) vaccine. One dose is recommended after age 67. Talk to your health care provider about which screenings and vaccines you need and how often you need them. This information is not intended to replace advice given to you by your health care provider. Make sure you discuss any questions you have with your health care provider. Document Released: 09/15/2015 Document Revised: 05/08/2016 Document Reviewed: 06/20/2015 Elsevier Interactive Patient Education  2017 [place] Prevention in the Home Falls can cause injuries. They can happen to people of all ages. There are many things you can do to make your home safe and to help prevent falls. What can I do on the outside of my home? Regularly fix the edges of walkways and driveways and fix any cracks. Remove anything that might make you trip as you walk through a door, such as a raised step or threshold. Trim any bushes or trees on the path to your home. Use bright outdoor lighting. Clear any walking paths of anything that might make someone trip, such as rocks or tools. Regularly check to see if handrails are loose or broken. Make sure that both sides of any steps have handrails. Any raised decks and porches should have guardrails on the edges. Have any leaves, snow, or ice cleared regularly. Use sand or salt on walking paths during winter. Clean up any spills in your garage right away. This includes oil or grease spills. What can I do in the bathroom? Use night lights. Install grab bars by the toilet and in the tub and shower. Do not use towel bars as grab bars. Use non-skid mats or decals in  the tub or shower. If you need to sit down in the shower, use a plastic, non-slip stool. Keep the floor dry. Clean up any water that spills on the floor as soon as it happens. Remove soap buildup in the tub or shower regularly. Attach bath mats securely with double-sided non-slip rug tape. Do not have throw rugs and other things on the floor that can make you trip. What can I do in the bedroom? Use night lights. Make sure that you have a light by your bed that is easy to reach. Do not use any sheets or blankets that are too big for your bed. They should not hang down onto the floor. Have a firm chair that has side arms. You can use this for support while you get dressed. Do not have throw rugs and other things on the floor that can make you trip. What can I do in the kitchen? Clean up any spills right away. Avoid walking on wet floors. Keep items that you use  a lot in easy-to-reach places. If you need to reach something above you, use a strong step stool that has a grab bar. Keep electrical cords out of the way. Do not use floor polish or wax that makes floors slippery. If you must use wax, use non-skid floor wax. Do not have throw rugs and other things on the floor that can make you trip. What can I do with my stairs? Do not leave any items on the stairs. Make sure that there are handrails on both sides of the stairs and use them. Fix handrails that are broken or loose. Make sure that handrails are as long as the stairways. Check any carpeting to make sure that it is firmly attached to the stairs. Fix any carpet that is loose or worn. Avoid having throw rugs at the top or bottom of the stairs. If you do have throw rugs, attach them to the floor with carpet tape. Make sure that you have a light switch at the top of the stairs and the bottom of the stairs. If you do not have them, ask someone to add them for you. What else can I do to help prevent falls? Wear shoes that: Do not have high  heels. Have rubber bottoms. Are comfortable and fit you well. Are closed at the toe. Do not wear sandals. If you use a stepladder: Make sure that it is fully opened. Do not climb a closed stepladder. Make sure that both sides of the stepladder are locked into place. Ask someone to hold it for you, if possible. Clearly mark and make sure that you can see: Any grab bars or handrails. First and last steps. Where the edge of each step is. Use tools that help you move around (mobility aids) if they are needed. These include: Canes. Walkers. Scooters. Crutches. Turn on the lights when you go into a dark area. Replace any light bulbs as soon as they burn out. Set up your furniture so you have a clear path. Avoid moving your furniture around. If any of your floors are uneven, fix them. If there are any pets around you, be aware of where they are. Review your medicines with your doctor. Some medicines can make you feel dizzy. This can increase your chance of falling. Ask your doctor what other things that you can do to help prevent falls. This information is not intended to replace advice given to you by your health care provider. Make sure you discuss any questions you have with your health care provider. Document Released: 06/15/2009 Document Revised: 01/25/2016 Document Reviewed: 09/23/2014 Elsevier Interactive Patient Education  2017 [employer].

## 2022-11-25 NOTE — Progress Notes (Signed)
Subjective:   Kimberly Roy is a 89 y.o. female who presents for Medicare Annual (Subsequent) preventive examination.  Review of Systems    No ROS.  Medicare Wellness Virtual Visit.  Visual/audio telehealth visit, UTA vital signs.   See social history for additional risk factors.   Cardiac Risk Factors include: advanced age (>88men, >53 women);hypertension     Objective:    Today's Vitals   11/25/22 1235  Weight: 167 lb (75.8 kg)  Height: 5\' 2"  (1.575 m)   Body mass index is 30.54 kg/m.     11/25/2022   12:58 PM 10/08/2022    3:22 PM 07/12/2022    8:00 AM 07/07/2022   10:20 AM 04/02/2022    2:58 PM 03/12/2022    3:00 PM 02/28/2022   11:45 AM  Advanced Directives  Does Patient Have a Medical Advance Directive? Yes Yes No No Yes Yes Yes  Type of Paramedic of [place];Living will [place];Living will   Living will;Healthcare Power of [place];Living will  Does patient want to make changes to medical advance directive?     Yes (ED - Information included in AVS) No - Patient declined   Copy of [place] in Chart? Yes - validated most recent copy scanned in chart (See row information)        Would patient like information on creating a medical advance directive?   No - Patient declined  Yes (ED - Information included in AVS)      Current Medications (verified) Outpatient Encounter Medications as of 11/25/2022  Medication Sig   albuterol (VENTOLIN HFA) 108 (90 Base) MCG/ACT inhaler INHALE 2 PUFFS INTO THE LUNGS EVERY 6 HOURS AS NEEDED FOR WHEEZING OR SHORTNESS OF BREATH   aluminum-magnesium hydroxide-simethicone (MAALOX) 200-200-20 MG/5ML SUSP Take 15 mLs by mouth 4 (four) times daily -  before meals and at bedtime.    amLODipine (NORVASC) 10 MG tablet TAKE 1 TABLET BY MOUTH DAILY   aspirin 81 MG EC tablet Take 81 mg by mouth daily. Every other day   atorvastatin (LIPITOR) 20 MG tablet TAKE 1  TABLET BY MOUTH AT BEDTIME   benazepril (LOTENSIN) 40 MG tablet TAKE 1 TABLET BY MOUTH DAILY   betamethasone dipropionate 0.05 % cream Apply 1 application topically 2 (two) times daily.   budesonide-formoterol (SYMBICORT) 80-4.5 MCG/ACT inhaler Inhale 2 puffs into the lungs in the morning and at bedtime.   busPIRone (BUSPAR) 10 MG tablet Take 10 mg by mouth 3 (three) times daily.   calcium citrate-vitamin D (CITRACAL+D) 315-200 MG-UNIT tablet Take 2 tablets by mouth daily.   Cholecalciferol (VITAMIN D3) 1000 UNITS CAPS Take 1 capsule by mouth daily.   clobetasol cream (TEMOVATE) [id number] % Apply 1 application topically See admin instructions. Apply to affected areas of body 1 - 2 times daily as needed for itchy bumps. Avoid applying to face, groin, and axilla. Use as directed.   cyanocobalamin (VITAMIN B12) 1000 MCG/ML injection Inject 1 mL (1,000 mcg total) into the muscle every 3 (three) months.   Diclofenac Sodium 3 % GEL Apply topically 2 (two) times daily.   Docusate Sodium (DSS) 100 MG CAPS Take 100 mg by mouth 2 (two) times daily.   DULoxetine (CYMBALTA) 60 MG capsule Take 60 mg by mouth daily.   Fluocinolone Acetonide 0.01 % OIL Apply 1-2 drops into ears once to twice daily as needed.   fluocinonide (LIDEX) 0.05 % external solution Apply topically  2 (two) times daily. Apply drops to scalp for itch   fluticasone (FLONASE) 50 MCG/ACT nasal spray Place 2 sprays into both nostrils daily.   furosemide (LASIX) 20 MG tablet TAKE 1 TABLET BY MOUTH DAILY AS NEEDED   gabapentin (NEURONTIN) 300 MG capsule Take two tablets tid (in am, midday and q hs).   HYDROcodone-acetaminophen (NORCO) 10-325 MG tablet Take 1 tablet by mouth 3 (three) times daily.   Infant Care Products [facility]) OINT Apply topically as directed (as discussed).   ipratropium (ATROVENT) 0.03 % nasal spray Place 2 sprays into both nostrils every 12 (twelve) hours.   ketoconazole (NIZORAL) 2 % shampoo Apply 1 Application topically See  admin instructions. apply three times per week, massage into scalp and leave in for 10 minutes before rinsing out   levalbuterol (XOPENEX) 0.63 MG/3ML nebulizer solution Take 3 mLs (0.63 mg total) by nebulization every 6 (six) hours as needed for wheezing or shortness of breath.   LINZESS 145 MCG CAPS capsule Take 145 mcg by mouth daily.   magnesium oxide (MAG-OX) 400 MG tablet Take 1 tablet (400 mg total) by mouth daily.   metoprolol succinate (TOPROL-XL) 50 MG 24 hr tablet TAKE 1 TABLET BY MOUTH DAILY. TAKE WITH OR IMMEDIATELY FOLLOWING A MEAL.   morphine (MS CONTIN) 30 MG 12 hr tablet Take 30 mg by mouth every 12 (twelve) hours.   morphine (MS CONTIN) 60 MG 12 hr tablet Take 1 tablet (60 mg total) by mouth 2 (two) times daily.   mupirocin ointment (BACTROBAN) 2 % Apply 1 Application topically 2 (two) times daily.   nystatin (MYCOSTATIN/NYSTOP) powder Apply 1 application. topically 2 (two) times daily as needed.   nystatin cream (MYCOSTATIN) Apply 1 Application topically 2 (two) times daily.   polyethylene glycol powder (GLYCOLAX/MIRALAX) 17 GM/SCOOP powder MIX 17 GRAMS AS MARKED ON BOTTLE TOP IN 8 OUNCES OF WATER AND DRINK ONCE A DAY AS DIRECTED.   RABEprazole (ACIPHEX) 20 MG tablet TAKE 1 TABLET BY MOUTH TWICE A DAY BEFORE A MEAL   Simethicone (GAS-X PO) Take 2 tablets by mouth 2 (two) times daily.   sodium chloride (OCEAN) 0.65 % nasal spray Place 1 spray into the nose as needed.   SYNTHROID 100 MCG tablet Take 1 tablet (100 mcg total) by mouth daily.   triamcinolone ointment (KENALOG) 0.1 % APPLY TWICE DAILY TO BITES AND RASH UNTIL FLAT AND SMOOTH **DO NOT APPLY TO FACE**   No facility-administered encounter medications on file as of 11/25/2022.    Allergies (verified) Lyrica [pregabalin], Omnicef [cefdinir], Atarax [hydroxyzine], Dicyclomine, Hydroxyzine hcl, Levaquin [levofloxacin], Nitrofurantoin, Nucynta er [tapentadol hcl er], Oxybutynin, Zoloft [sertraline hcl], Biaxin  [clarithromycin], Meloxicam, Sertraline, Silicone, Sulfa antibiotics, Sulfasalazine, Tape, Tapentadol, and Venofer [iron sucrose]   History: Past Medical History:  Diagnosis Date   Anemia    Anxiety    Chest pain    CHF (congestive heart failure) (HCC)    Constipation    DDD (degenerative disc disease), cervical    Depression    DVT (deep venous thrombosis) (HCC)    Dysphonia    Dyspnea    Fatty liver    Fatty liver    Headache    Hyperlipidemia    Hyperpiesia    Hypertension    Hypothyroidism    Interstitial cystitis    Left ventricular dysfunction    Lymphedema    Nephrolithiasis    Obstructive sleep apnea    Osteoarthritis    knees/cervical and lumbar spine   Pulmonary hypertension (  [place])    Pulmonary nodules    followed by Dr Raul Del   Pure hypercholesterolemia    Renal cyst    right   Past Surgical History:  Procedure Laterality Date   ABDOMINAL HYSTERECTOMY     ovaries left in place   APPENDECTOMY     Back Surgeries     BACK SURGERY     BREAST REDUCTION SURGERY     3/99   CARDIAC CATHETERIZATION     cataracts Bilateral    CERVICAL SPINE SURGERY     ESOPHAGEAL MANOMETRY N/A 08/02/2015   Procedure: ESOPHAGEAL MANOMETRY (EM);  Surgeon: Josefine Class, MD;  Location: [facility] ENDOSCOPY;  Service: Endoscopy;  Laterality: N/A;   ESOPHAGOGASTRODUODENOSCOPY N/A 02/27/2015   Procedure: ESOPHAGOGASTRODUODENOSCOPY (EGD);  Surgeon: Hulen Luster, MD;  Location[facility] END[facility]COPY;  Service: Gastroenterology;  Laterality: N/A;   ESOPHAGOGASTRODUODENOSCOPY (EGD) WITH PROPOFOL N/A 10/30/2018   Procedure: ESOPHAGOGASTRODUODENOSCOPY (EGD) WITH PROPOFOL;  Surgeon: Lollie Sails, MD;  Location[facility] ENDOSCOPY;  Service: Endoscopy;  Laterality: N/A;   EXCISIONAL HEMORRHOIDECTOMY     EYE SURGERY     FRACTURE SURGERY     HEMORRHOID SURGERY     HIP SURGERY  2013   Right hip surgery   JOINT REPLACEMENT     KNEE ARTHROSCOPY     left and right   ORIF FEMUR FRACTURE Right  08/07/2018   Procedure: OPEN REDUCTION INTERNAL FIXATION (ORIF) DISTAL FEMUR FRACTURE;  Surgeon: Hessie Knows, MD;  Location: [facility] ORS;  Service: Orthopedics;  Laterality: Right;   REDUCTION MAMMAPLASTY Bilateral YRS AGO   REPLACEMENT TOTAL KNEE Bilateral    rotator cuff surgery     blilateral   TONSILECTOMY/ADENOIDECTOMY WITH MYRINGOTOMY     VISCERAL ARTERY INTERVENTION N/A 02/25/2019   Procedure: VISCERAL ARTERY INTERVENTION;  Surgeon: Algernon Huxley, MD;  Location: [facility];  Service: Cardiovascular;  Laterality: N/A;   Family History  Problem Relation Age of Onset   Heart disease Mother    Stroke Mother    Hypertension Mother    Heart disease Father        myocardial infarction age 61   Breast cancer Neg Hx    Social History   Socioeconomic History   Marital status: Widowed    Spouse name: Not on file   Number of children: 2   Years of education: 12   Highest education level: 12th grade  Occupational History    Comment: insurance agency  Tobacco Use   Smoking status: Never   Smokeless tobacco: Never  Vaping Use   Vaping Use: Never used  Substance and Sexual Activity   Alcohol use: No    Alcohol/week: 0.0 standard drinks of alcohol   Drug use: Never   Sexual activity: Not Currently  Other Topics Concern   Not on file  Social History Narrative   Not on file   Social Determinants of Health   Financial Resource Strain: Low Risk  (11/25/2022)   Overall Financial Resource Strain (CARDIA)    Difficulty of Paying Living Expenses: Not hard at all  Food Insecurity: No Food Insecurity (11/25/2022)   Hunger Vital Sign    Worried About Running Out of Food in the Last Year: Never true    Ran Out of Food in the Last Year: Never true  Transportation Needs: No Transportation Needs (11/25/2022)   PRAPARE - Hydrologist (Medical): No    Lack of Transportation (Non-Medical): No  Recent Concern: Transportation Needs -  Unmet Transportation  Needs (09/05/2022)   PRAPARE - Hydrologist (Medical): Yes    Lack of Transportation (Non-Medical): Yes  Physical Activity: Insufficiently Active (11/25/2022)   Exercise Vital Sign    Days of Exercise per Week: 7 days    Minutes of Exercise per Session: 10 min  Stress: No Stress Concern Present (11/25/2022)   [place]    Feeling of Stress : Not at all  Social Connections: Socially Isolated (11/25/2022)   Social Connection and Isolation Panel [NHANES]    Frequency of Communication with Friends and Family: Three times a week    Frequency of Social Gatherings with Friends and Family: More than three times a week    Attends Religious Services: Never    Marine scientist or Organizations: No    Attends Archivist Meetings: Never    Marital Status: Widowed    Tobacco Counseling Counseling given: Not Answered   Clinical Intake:  Pre-visit preparation completed: Yes           How often do you need to have someone help you when you read instructions, pamphlets, or other written materials from your doctor or pharmacy?: 1 - Never   Interpreter Needed?: No      Activities of Daily Living    11/25/2022   12:44 PM 07/12/2022    8:00 AM  In your present state of health, do you have any difficulty performing the following activities:  Hearing? 1 0  Vision? 0 0  Difficulty concentrating or making decisions? 0 0  Walking or climbing stairs? 1 0  Comment Walker in use   Dressing or bathing? 1 0  Comment Caregiver assist   Doing errands, shopping? 1 0  Comment Caregiver assist   Preparing Food and eating ? N   Using the Toilet? N   In the past six months, have you accidently leaked urine? N   Do you have problems with loss of bowel control? N   Managing your Medications? Y   Comment Daughter or caregiver assist as needed   Managing your Finances? N   Comment Daughter  assist as needed   Housekeeping or managing your Housekeeping? Y   Comment Daughter or caregiver assist as needed     Patient Care Team: Einar Pheasant, [name], [name], [name], [name], MD as Consulting Physician (Hematology and Oncology)  Indicate any recent Medical Services you may have received from other than [facility] providers in the past year (date may be approximate).     Assessment:   This is a routine wellness examination for [place].  I connected with  [name] on 11/25/22 by a audio enabled telemedicine application and verified that I am speaking with the correct person using two identifiers.  Patient Location: Home  Provider Location: Office/Clinic  I discussed the limitations of evaluation and management by telemedicine. The patient expressed understanding and agreed to proceed.   Hearing/Vision screen Hearing Screening - Comments:: Hearing aids Vision Screening - Comments:: Followed by [facility] Cataract extraction, bilateral  Dietary issues and exercise activities discussed: Current Exercise Habits: Home exercise routine, Time (Minutes): 15, Frequency (Times/Week): 5, Weekly Exercise (Minutes/Week): 75, Intensity: Mild Healthy diet   Goals Addressed               This Visit's  Progress     Patient Stated     I plan to start walking for exercise (pt-stated)        Once the weather is better       Other     COMPLETED: Increase physical activity        Water exercises up to 2 days a week , 30 minutes each session        Depression Screen    11/25/2022   12:44 PM 09/11/2022    1:42 PM 08/07/2022    1:22 PM 07/19/2022   11:02 AM 06/27/2022    2:22 PM 01/21/2022   11:06 AM 11/15/2021    2:12 PM  PHQ 2/9 Scores  PHQ - 2 Score 0 1 0 0 0 0 0    Fall Risk    11/25/2022   12:43 PM  09/11/2022    1:42 PM 08/07/2022    1:22 PM 07/19/2022   11:02 AM 06/27/2022    2:22 PM  Fall Risk   Falls in the past year? 0 0 0 0 0  Number falls in past yr: 0 0 0 0 0  Injury with Fall?  0 0 0 0  Risk for fall due to :  History of fall(s);Impaired mobility;Impaired balance/gait No Fall Risks No Fall Risks No Fall Risks  Follow up Falls evaluation completed;Falls prevention discussed Falls evaluation completed Falls evaluation completed Falls evaluation completed Falls evaluation completed    FALL RISK PREVENTION PERTAINING TO THE HOME: Home free of loose throw rugs in walkways, pet beds, electrical cords, etc? Yes  Adequate lighting in your home to reduce risk of falls? Yes   ASSISTIVE DEVICES UTILIZED TO PREVENT FALLS: Life alert? No  Use of a cane, walker or w/c? Yes  Grab bars in the bathroom? Yes  Shower chair or bench in shower? Yes  Elevated toilet seat or a handicapped toilet? Yes   TIMED UP AND GO: Was the test performed? No .   Cognitive Function:    03/08/2015   11:34 AM  MMSE - Mini Mental State Exam  Orientation to time 5  Orientation to Place 5  Registration 3  Attention/ Calculation 5  Recall 3  Language- name 2 objects 2  Language- repeat 1  Language- follow 3 step command 3  Language- read & follow direction 1  Write a sentence 1  Copy design 1  Total score 30        11/25/2022   12:58 PM 11/11/2018   12:36 PM 09/10/2017    4:05 PM 08/02/2016    2:35 PM  6CIT Screen  What Year? 0 points 0 points 0 points 0 points  What month? 0 points 0 points 0 points 0 points  What time? 0 points 0 points 0 points 0 points  Count back from 20 0 points 0 points 0 points 0 points  Months in reverse 0 points 0 points 0 points 0 points  Repeat phrase 2 points 0 points 0 points 0 points  Total Score 2 points 0 points 0 points 0 points    Immunizations Immunization History  Administered Date(s) Administered   Fluad Quad(high Dose 65+) 05/31/2020, 05/14/2021,  05/29/2022   Influenza Split 07/13/2009, 07/03/2012   Influenza, High Dose Seasonal PF 05/13/2016, 06/11/2017, 06/05/2018, 06/07/2019   Influenza,inj,Quad PF,6+ Mos 05/25/2013, 05/31/2014, 04/27/2015   PFIZER(Purple Top)SARS-COV-2 Vaccination 12/16/2019, 01/14/2020   Pneumococcal Conjugate-13 11/29/2014   Pneumococcal-Unspecified 06/22/2001   Tdap 09/17/2017   Zoster Recombinat (Shingrix) 06/07/2019, 08/20/2019  Screening Tests Health Maintenance  Topic Date Due   Pneumonia Vaccine 26+ Years old (2 of 2 - PPSV23 or PCV20) 10/18/2023 (Originally 11/29/2015)   MAMMOGRAM  03/20/2023   Medicare Annual Wellness (AWV)  11/25/2023   DTaP/Tdap/Td (2 - Td or Tdap) 09/18/2027   INFLUENZA VACCINE  Completed   DEXA SCAN  Completed   Zoster Vaccines- Shingrix  Completed   HPV VACCINES  Aged Out   COVID-19 Vaccine  Discontinued   Health Maintenance There are no preventive care reminders to display for this patient.  Mammogram- ordered per consent. Number provided for future scheduling [phone]. Patient notes she has the number.   Lung Cancer Screening: (Low Dose CT Chest recommended if Age 23-80 years, 30 pack-year currently smoking OR have quit w/in 15years.) does not qualify.   Hepatitis C Screening: does not qualify.  Vision Screening: Recommended annual ophthalmology exams for early detection of glaucoma and other disorders of the eye.  Dental Screening: Recommended annual dental exams for proper oral hygiene  Community Resource Referral / Chronic Care Management: CRR required this visit?  No   CCM required this visit?  No      Plan:     I have personally reviewed and noted the following in the patient's chart:   Medical and social history Use of alcohol, tobacco or illicit drugs  Current medications and supplements including opioid prescriptions. Patient is not currently taking opioid prescriptions. Functional ability and status Nutritional status Physical  activity Advanced directives List of other physicians Hospitalizations, surgeries, and ER visits in previous 12 months Vitals Screenings to include cognitive, depression, and falls Referrals and appointments  In addition, I have reviewed and discussed with patient certain preventive protocols, quality metrics, and best practice recommendations. A written personalized care plan for preventive services as well as general preventive health recommendations were provided to patient.     Leta Jungling, LPN   [id number]

## 2022-11-25 NOTE — Addendum Note (Signed)
Addended by: Leta Jungling on: 11/25/2022 01:59 PM   Modules accepted: Orders

## 2022-11-28 DIAGNOSIS — R69 Illness, unspecified: Secondary | ICD-10-CM | POA: Diagnosis not present

## 2022-12-02 DIAGNOSIS — I272 Pulmonary hypertension, unspecified: Secondary | ICD-10-CM | POA: Diagnosis not present

## 2022-12-02 DIAGNOSIS — K551 Chronic vascular disorders of intestine: Secondary | ICD-10-CM | POA: Diagnosis not present

## 2022-12-02 DIAGNOSIS — I350 Nonrheumatic aortic (valve) stenosis: Secondary | ICD-10-CM | POA: Diagnosis not present

## 2022-12-02 DIAGNOSIS — I340 Nonrheumatic mitral (valve) insufficiency: Secondary | ICD-10-CM | POA: Diagnosis not present

## 2022-12-02 DIAGNOSIS — R001 Bradycardia, unspecified: Secondary | ICD-10-CM | POA: Diagnosis not present

## 2022-12-02 DIAGNOSIS — E7800 Pure hypercholesterolemia, unspecified: Secondary | ICD-10-CM | POA: Diagnosis not present

## 2022-12-02 DIAGNOSIS — Z9889 Other specified postprocedural states: Secondary | ICD-10-CM | POA: Diagnosis not present

## 2022-12-02 DIAGNOSIS — I10 Essential (primary) hypertension: Secondary | ICD-10-CM | POA: Diagnosis not present

## 2022-12-04 ENCOUNTER — Ambulatory Visit: Payer: Medicare HMO | Admitting: Dermatology

## 2022-12-10 ENCOUNTER — Other Ambulatory Visit: Payer: Self-pay | Admitting: Internal Medicine

## 2022-12-12 ENCOUNTER — Encounter: Payer: Self-pay | Admitting: Internal Medicine

## 2022-12-12 ENCOUNTER — Ambulatory Visit (INDEPENDENT_AMBULATORY_CARE_PROVIDER_SITE_OTHER): Payer: Medicare HMO | Admitting: Internal Medicine

## 2022-12-12 VITALS — BP 132/62 | HR 70 | Temp 97.9°F | Resp 16 | Ht 62.0 in | Wt 169.5 lb

## 2022-12-12 DIAGNOSIS — R935 Abnormal findings on diagnostic imaging of other abdominal regions, including retroperitoneum: Secondary | ICD-10-CM

## 2022-12-12 DIAGNOSIS — I872 Venous insufficiency (chronic) (peripheral): Secondary | ICD-10-CM

## 2022-12-12 DIAGNOSIS — E033 Postinfectious hypothyroidism: Secondary | ICD-10-CM

## 2022-12-12 DIAGNOSIS — I700 Atherosclerosis of aorta: Secondary | ICD-10-CM

## 2022-12-12 DIAGNOSIS — K219 Gastro-esophageal reflux disease without esophagitis: Secondary | ICD-10-CM | POA: Diagnosis not present

## 2022-12-12 DIAGNOSIS — D649 Anemia, unspecified: Secondary | ICD-10-CM | POA: Diagnosis not present

## 2022-12-12 DIAGNOSIS — E785 Hyperlipidemia, unspecified: Secondary | ICD-10-CM

## 2022-12-12 DIAGNOSIS — I10 Essential (primary) hypertension: Secondary | ICD-10-CM

## 2022-12-12 DIAGNOSIS — M542 Cervicalgia: Secondary | ICD-10-CM

## 2022-12-12 DIAGNOSIS — I509 Heart failure, unspecified: Secondary | ICD-10-CM

## 2022-12-12 DIAGNOSIS — I350 Nonrheumatic aortic (valve) stenosis: Secondary | ICD-10-CM

## 2022-12-12 DIAGNOSIS — I272 Pulmonary hypertension, unspecified: Secondary | ICD-10-CM

## 2022-12-12 DIAGNOSIS — G629 Polyneuropathy, unspecified: Secondary | ICD-10-CM

## 2022-12-12 NOTE — Progress Notes (Signed)
Subjective:    Patient ID: Kimberly Roy, female    DOB: 11-27-1930, 89 y.o.   MRN: [id number]  Patient here for  Chief Complaint  Patient presents with   Medical Management of Chronic Issues    HPI Here to follow up regarding her blood pressure and cholesterol.  No increased cough or congestion. No abdominal pain.  Eating.  No nausea or vomiting.  Saw GI 10/23/22.  Elected to hold on EUS.  Doing better.  Saw pulmonary 10/30/22 - started on symbicort. Breathing better. Saw cardiology 12/02/22.  Recommended adjustment in lasix dose as outlined.  Main complaint today is that of neck pain.  She sits bent over - constant strain on her neck.  Discussed PT - home health - to work on her neck and posture.   Agreeable.    Past Medical History:  Diagnosis Date   Anemia    Anxiety    Chest pain    CHF (congestive heart failure)    Constipation    DDD (degenerative disc disease), cervical    Depression    DVT (deep venous thrombosis)    Dysphonia    Dyspnea    Fatty liver    Fatty liver    Headache    Hyperlipidemia    Hyperpiesia    Hypertension    Hypothyroidism    Interstitial cystitis    Left ventricular dysfunction    Lymphedema    Nephrolithiasis    Obstructive sleep apnea    Osteoarthritis    knees/cervical and lumbar spine   Pulmonary hypertension    Pulmonary nodules    followed by Dr Meredeth Ide   Pure hypercholesterolemia    Renal cyst    right   Past Surgical History:  Procedure Laterality Date   ABDOMINAL HYSTERECTOMY     ovaries left in place   APPENDECTOMY     Back Surgeries     BACK SURGERY     BREAST REDUCTION SURGERY     3/99   CARDIAC CATHETERIZATION     cataracts Bilateral    CERVICAL SPINE SURGERY     ESOPHAGEAL MANOMETRY N/A 08/02/2015   Procedure: ESOPHAGEAL MANOMETRY (EM);  Surgeon: Elnita Maxwell, MD;  Location[facility] ENDOSCOPY;  Service: Endoscopy;  Laterality: N/A;   ESOPHAGOGASTRODUODENOSCOPY N/A 02/27/2015   Procedure:  ESOPHAGOGASTRODUODENOSCOPY (EGD);  Surgeon: Wallace Cullens, MD;  Location: [facility] ENDOSCOPY;  Service: Gastroenterology;  Laterality: N/A;   ESOPHAGOGASTRODUODENOSCOPY (EGD) WITH PROPOFOL N/A 10/30/2018   Procedure: ESOPHAGOGASTRODUODENOSCOPY (EGD) WITH PROPOFOL;  Surgeon: Christena Deem, MD;  Location: [facility] ENDOSCOPY;  Service: Endoscopy;  Laterality: N/A;   EXCISIONAL HEMORRHOIDECTOMY     EYE SURGERY     FRACTURE SURGERY     HEMORRHOID SURGERY     HIP SURGERY  2013   Right hip surgery   JOINT REPLACEMENT     KNEE ARTHROSCOPY     left and right   ORIF FEMUR FRACTURE Right 08/07/2018   Procedure: OPEN REDUCTION INTERNAL FIXATION (ORIF) DISTAL FEMUR FRACTURE;  Surgeon: Kennedy Bucker, MD;  Location: [facility] ORS;  Service: Orthopedics;  Laterality: Right;   REDUCTION MAMMAPLASTY Bilateral YRS AGO   REPLACEMENT TOTAL KNEE Bilateral    rotator cuff surgery     blilateral   TONSILECTOMY/ADENOIDECTOMY WITH MYRINGOTOMY     VISCERAL ARTERY INTERVENTION N/A 02/25/2019   Procedure: VISCERAL ARTERY INTERVENTION;  Surgeon: Annice Needy, MD;  Location: [facility] INVASIVE CV LAB;  Service: Cardiovascular;  Laterality: N/A;   Family History  Problem  Relation Age of Onset   Heart disease Mother    Stroke Mother    Hypertension Mother    Heart disease Father        myocardial infarction age 65   Breast cancer Neg Hx    Social History   Socioeconomic History   Marital status: Widowed    Spouse name: Not on file   Number of children: 2   Years of education: 66   Highest education level: 12th grade  Occupational History    Comment: insurance agency  Tobacco Use   Smoking status: Never   Smokeless tobacco: Never  Vaping Use   Vaping Use: Never used  Substance and Sexual Activity   Alcohol use: No    Alcohol/week: 0.0 standard drinks of alcohol   Drug use: Never   Sexual activity: Not Currently  Other Topics Concern   Not on file  Social History Narrative   Not on file   Social Determinants of  Health   Financial Resource Strain: Low Risk  (11/25/2022)   Overall Financial Resource Strain (CARDIA)    Difficulty of Paying Living Expenses: Not hard at all  Food Insecurity: No Food Insecurity (11/25/2022)   Hunger Vital Sign    Worried About Running Out of Food in the Last Year: Never true    Ran Out of Food in the Last Year: Never true  Transportation Needs: No Transportation Needs (11/25/2022)   PRAPARE - Administrator, Civil Service (Medical): No    Lack of Transportation (Non-Medical): No  Recent Concern: Transportation Needs - Unmet Transportation Needs (09/05/2022)   PRAPARE - Transportation    Lack of Transportation (Medical): Yes    Lack of Transportation (Non-Medical): Yes  Physical Activity: Insufficiently Active (11/25/2022)   Exercise Vital Sign    Days of Exercise per Week: 7 days    Minutes of Exercise per Session: 10 min  Stress: No Stress Concern Present (11/25/2022)   [employer] of Occupational Health - Occupational Stress Questionnaire    Feeling of Stress : Not at all  Social Connections: Socially Isolated (11/25/2022)   Social Connection and Isolation Panel [NHANES]    Frequency of Communication with Friends and Family: Three times a week    Frequency of Social Gatherings with Friends and Family: More than three times a week    Attends Religious Services: Never    Database administrator or Organizations: No    Attends Banker Meetings: Never    Marital Status: Widowed     Review of Systems  Constitutional:  Negative for appetite change and unexpected weight change.  HENT:  Negative for congestion and sinus pressure.   Respiratory:  Negative for cough and chest tightness.        Breathing stable.   Cardiovascular:  Negative for chest pain and palpitations.  Gastrointestinal:  Negative for abdominal pain, diarrhea, nausea and vomiting.  Genitourinary:  Negative for difficulty urinating and dysuria.  Musculoskeletal:  Positive  for neck pain. Negative for joint swelling and myalgias.  Skin:  Negative for color change and rash.  Neurological:  Negative for dizziness and headaches.  Psychiatric/Behavioral:  Negative for agitation and dysphoric mood.        Objective:     BP 132/62   Pulse 70   Temp 97.9 F (36.6 C)   Resp 16   Ht 5\' 2"  (1.575 m)   Wt 169 lb 8 oz (76.9 kg)   SpO2 96%   BMI  31.00 kg/m  Wt Readings from Last 3 Encounters:  12/12/22 169 lb 8 oz (76.9 kg)  11/25/22 167 lb (75.8 kg)  10/30/22 167 lb (75.8 kg)    Physical Exam Vitals reviewed.  Constitutional:      General: She is not in acute distress.    Appearance: Normal appearance.  HENT:     Head: Normocephalic and atraumatic.     Right Ear: External ear normal.     Left Ear: External ear normal.  Eyes:     General: No scleral icterus.       Right eye: No discharge.        Left eye: No discharge.     Conjunctiva/sclera: Conjunctivae normal.  Neck:     Thyroid: No thyromegaly.  Cardiovascular:     Rate and Rhythm: Normal rate and regular rhythm.  Pulmonary:     Effort: No respiratory distress.     Breath sounds: Normal breath sounds. No wheezing.  Abdominal:     General: Bowel sounds are normal.     Palpations: Abdomen is soft.     Tenderness: There is no abdominal tenderness.  Musculoskeletal:        General: No swelling or tenderness.     Cervical back: Neck supple. No tenderness.     Comments: Sitting bent forward.    Lymphadenopathy:     Cervical: No cervical adenopathy.  Skin:    Findings: No erythema or rash.  Neurological:     Mental Status: She is alert.  Psychiatric:        Mood and Affect: Mood normal.        Behavior: Behavior normal.      Outpatient Encounter Medications as of 12/12/2022  Medication Sig   albuterol (VENTOLIN HFA) 108 (90 Base) MCG/ACT inhaler INHALE 2 PUFFS INTO THE LUNGS EVERY 6 HOURS AS NEEDED FOR WHEEZING OR SHORTNESS OF BREATH   aluminum-magnesium hydroxide-simethicone  (MAALOX) 200-200-20 MG/5ML SUSP Take 15 mLs by mouth 4 (four) times daily -  before meals and at bedtime.    amLODipine (NORVASC) 10 MG tablet TAKE 1 TABLET BY MOUTH DAILY   aspirin 81 MG EC tablet Take 81 mg by mouth daily. Every other day   atorvastatin (LIPITOR) 20 MG tablet TAKE 1 TABLET BY MOUTH AT BEDTIME   benazepril (LOTENSIN) 40 MG tablet TAKE 1 TABLET BY MOUTH DAILY   betamethasone dipropionate 0.05 % cream Apply 1 application topically 2 (two) times daily.   budesonide-formoterol (SYMBICORT) 80-4.5 MCG/ACT inhaler Inhale 2 puffs into the lungs in the morning and at bedtime.   busPIRone (BUSPAR) 10 MG tablet Take 10 mg by mouth 3 (three) times daily.   calcium citrate-vitamin D (CITRACAL+D) 315-200 MG-UNIT tablet Take 2 tablets by mouth daily.   Cholecalciferol (VITAMIN D3) 1000 UNITS CAPS Take 1 capsule by mouth daily.   clobetasol cream (TEMOVATE) 0.05 % Apply 1 application topically See admin instructions. Apply to affected areas of body 1 - 2 times daily as needed for itchy bumps. Avoid applying to face, groin, and axilla. Use as directed.   cyanocobalamin (VITAMIN B12) 1000 MCG/ML injection Inject 1 mL (1,000 mcg total) into the muscle every 3 (three) months.   Diclofenac Sodium 3 % GEL Apply topically 2 (two) times daily.   Docusate Sodium (DSS) 100 MG CAPS Take 100 mg by mouth 2 (two) times daily.   DULoxetine (CYMBALTA) 60 MG capsule Take 60 mg by mouth daily.   Fluocinolone Acetonide 0.01 % OIL Apply 1-2 drops  into ears once to twice daily as needed.   fluocinonide (LIDEX) 0.05 % external solution Apply topically 2 (two) times daily. Apply drops to scalp for itch   fluticasone (FLONASE) 50 MCG/ACT nasal spray Place 2 sprays into both nostrils daily.   gabapentin (NEURONTIN) 300 MG capsule Take two tablets tid (in am, midday and q hs).   HYDROcodone-acetaminophen (NORCO) 10-325 MG tablet Take 1 tablet by mouth 3 (three) times daily.   Infant Care Products [facility](DERMACLOUD) OINT Apply  topically as directed (as discussed).   ipratropium (ATROVENT) 0.03 % nasal spray Place 2 sprays into both nostrils every 12 (twelve) hours.   ketoconazole (NIZORAL) 2 % shampoo Apply 1 Application topically See admin instructions. apply three times per week, massage into scalp and leave in for 10 minutes before rinsing out   levalbuterol (XOPENEX) 0.63 MG/3ML nebulizer solution Take 3 mLs (0.63 mg total) by nebulization every 6 (six) hours as needed for wheezing or shortness of breath.   LINZESS 145 MCG CAPS capsule Take 145 mcg by mouth daily.   magnesium oxide (MAG-OX) 400 MG tablet Take 1 tablet (400 mg total) by mouth daily.   metoprolol succinate (TOPROL-XL) 50 MG 24 hr tablet TAKE 1 TABLET BY MOUTH DAILY. TAKE WITH OR IMMEDIATELY FOLLOWING A MEAL.   morphine (MS CONTIN) 30 MG 12 hr tablet Take 30 mg by mouth every 12 (twelve) hours.   morphine (MS CONTIN) 60 MG 12 hr tablet Take 1 tablet (60 mg total) by mouth 2 (two) times daily.   mupirocin ointment (BACTROBAN) 2 % Apply 1 Application topically 2 (two) times daily.   nystatin (MYCOSTATIN/NYSTOP) powder Apply 1 application. topically 2 (two) times daily as needed.   nystatin cream (MYCOSTATIN) Apply 1 Application topically 2 (two) times daily.   polyethylene glycol powder (GLYCOLAX/MIRALAX) 17 GM/SCOOP powder MIX 17 GRAMS AS MARKED ON BOTTLE TOP IN 8 OUNCES OF WATER AND DRINK ONCE A DAY AS DIRECTED.   Simethicone (GAS-X PO) Take 2 tablets by mouth 2 (two) times daily.   sodium chloride (OCEAN) 0.65 % nasal spray Place 1 spray into the nose as needed.   SYNTHROID 100 MCG tablet Take 1 tablet (100 mcg total) by mouth daily.   triamcinolone ointment (KENALOG) 0.1 % APPLY TWICE DAILY TO BITES AND RASH UNTIL FLAT AND SMOOTH **DO NOT APPLY TO FACE**   [DISCONTINUED] furosemide (LASIX) 20 MG tablet TAKE 1 TABLET BY MOUTH DAILY AS NEEDED   [DISCONTINUED] RABEprazole (ACIPHEX) 20 MG tablet TAKE 1 TABLET BY MOUTH TWICE A DAY BEFORE A MEAL   No  facility-administered encounter medications on file as of 12/12/2022.     Lab Results  Component Value Date   WBC 6.8 10/16/2022   HGB 11.1 (L) 10/16/2022   HCT 33.7 (L) 10/16/2022   PLT 191 10/16/2022   GLUCOSE 71 12/12/2022   CHOL 143 01/21/2022   TRIG 108.0 01/21/2022   HDL 57.90 01/21/2022   LDLCALC 63 01/21/2022   ALT 13 10/16/2022   AST 20 10/16/2022   NA 142 12/12/2022   K 4.1 12/12/2022   CL 102 12/12/2022   CREATININE 0.82 12/12/2022   BUN 17 12/12/2022   CO2 29 12/12/2022   TSH 4.63 01/21/2022   INR 0.89 08/06/2018   HGBA1C 6.0 10/19/2014    No results found.     Assessment & Plan:  Primary hypertension Assessment & Plan: Blood pressure as outlined.  Continue benazepril, amlodipine, lasix and metoprolol.  Follow pressures.  Follow metabolic panel.  Orders: -     Basic metabolic panel  Abnormal CT of the abdomen Assessment & Plan: Was seen ER - persistent diarrhea and weakness and abdominal pain.  CT - Development of dilatation of the biliary tree with filling defect suggested along distal common duct. Mass lesion is not excluded recommend further evaluation. Overall feeling better.  Appetite improved.  Saw GI. Declined EUS.  Doing better.  Feeling better.  Follow.    Anemia, unspecified type Assessment & Plan: Seeing Dr Donneta Romberg - previous rash to IV venofer.  Oral iron daily.  B12 injections.  Follow cbc.    Aortic atherosclerosis Assessment & Plan: Continue lipitor.    Aortic valve stenosis, etiology of cardiac valve disease unspecified Assessment & Plan: Followed by cardiology.     Congestive heart failure, unspecified HF chronicity, unspecified heart failure type Assessment & Plan: ECHO 60% with G1DD.  No evidence of volume overload on exam.  Continue lasix as directed by cardiology.  Follow metabolic panel.    Chronic venous insufficiency Assessment & Plan: Continue compression hose.  Swelling overall improved.    Gastroesophageal  reflux disease, unspecified whether esophagitis present Assessment & Plan: Continue aciphex.  Followed by GI.    Hyperlipidemia, unspecified hyperlipidemia type Assessment & Plan: On lipitor.  Low cholesterol diet and exercise.  Follow lipid panel and liver function tests.     Postinfectious hypothyroidism Assessment & Plan: On thyroid replacement.  Follow tsh.    Neck pain Assessment & Plan: Neck and shoulder pain as outlined.  Saw ortho.  Some limitations - using her walker, etc.  Discussed PT - for evaluation and treatment neck and core muscles.   Orders: -     Ambulatory referral to Home Health  Neuropathy Assessment & Plan: Continues on gabapentin.  Follow.    Pulmonary hypertension Assessment & Plan: Followed by pulmonary.  Continue nighttime oxygen.       Dale [place], MD

## 2022-12-13 LAB — BASIC METABOLIC PANEL
BUN: 17 mg/dL (ref 6–23)
CO2: 29 mEq/L (ref 19–32)
Calcium: 9.5 mg/dL (ref 8.4–10.5)
Chloride: 102 mEq/L (ref 96–112)
Creatinine, Ser: 0.82 mg/dL (ref 0.40–1.20)
GFR: 63.26 mL/min (ref >60.00)
Glucose, Bld: 71 mg/dL (ref 70–99)
Potassium: 4.1 mEq/L (ref 3.5–5.1)
Sodium: 142 mEq/L (ref 135–145)

## 2022-12-15 ENCOUNTER — Encounter: Payer: Self-pay | Admitting: Internal Medicine

## 2022-12-16 ENCOUNTER — Encounter: Payer: Self-pay | Admitting: Internal Medicine

## 2022-12-16 NOTE — Assessment & Plan Note (Signed)
Followed by cardiology 

## 2022-12-16 NOTE — Assessment & Plan Note (Signed)
Continue lipitor  ?

## 2022-12-16 NOTE — Assessment & Plan Note (Signed)
Seeing Dr Donneta Romberg - previous rash to IV venofer.  Oral iron daily.  B12 injections.  Follow cbc.

## 2022-12-16 NOTE — Assessment & Plan Note (Signed)
Followed by pulmonary.  Continue nighttime oxygen.  

## 2022-12-16 NOTE — Assessment & Plan Note (Signed)
ECHO 60% with G1DD.  No evidence of volume overload on exam.  Continue lasix as directed by cardiology.  Follow metabolic panel.

## 2022-12-16 NOTE — Assessment & Plan Note (Signed)
On thyroid replacement.  Follow tsh.  

## 2022-12-16 NOTE — Assessment & Plan Note (Signed)
Blood pressure as outlined.  Continue benazepril, amlodipine, lasix and metoprolol.  Follow pressures.  Follow metabolic panel.  

## 2022-12-16 NOTE — Assessment & Plan Note (Signed)
On lipitor.  Low cholesterol diet and exercise.  Follow lipid panel and liver function tests.   

## 2022-12-16 NOTE — Progress Notes (Incomplete)
Subjective:    Patient ID: Kimberly Roy, female    DOB: 31-Jul-1931, 89 y.o.   MRN: [id number]  Patient here for  Chief Complaint  Patient presents with  . Medical Management of Chronic Issues    HPI Here to follow up regarding her blood pressure and cholesterol.  Breathing better.  No increased cough or congestion.    Past Medical History:  Diagnosis Date  . Anemia   . Anxiety   . Chest pain   . CHF (congestive heart failure)   . Constipation   . DDD (degenerative disc disease), cervical   . Depression   . DVT (deep venous thrombosis)   . Dysphonia   . Dyspnea   . Fatty liver   . Fatty liver   . Headache   . Hyperlipidemia   . Hyperpiesia   . Hypertension   . Hypothyroidism   . Interstitial cystitis   . Left ventricular dysfunction   . Lymphedema   . Nephrolithiasis   . Obstructive sleep apnea   . Osteoarthritis    knees/cervical and lumbar spine  . Pulmonary hypertension   . Pulmonary nodules    followed by Dr Meredeth Ide  . Pure hypercholesterolemia   . Renal cyst    right   Past Surgical History:  Procedure Laterality Date  . ABDOMINAL HYSTERECTOMY     ovaries left in place  . APPENDECTOMY    . Back Surgeries    . BACK SURGERY    . BREAST REDUCTION SURGERY     3/99  . CARDIAC CATHETERIZATION    . cataracts Bilateral   . CERVICAL SPINE SURGERY    . ESOPHAGEAL MANOMETRY N/A 08/02/2015   Procedure: ESOPHAGEAL MANOMETRY (EM);  Surgeon: Elnita Maxwell, MD;  Location: [facility] ENDOSCOPY;  Service: Endoscopy;  Laterality: N/A;  . ESOPHAGOGASTRODUODENOSCOPY N/A 02/27/2015   Procedure: ESOPHAGOGASTRODUODENOSCOPY (EGD);  Surgeon: Wallace Cullens, MD;  Location: [facility] ENDOSCOPY;  Service: Gastroenterology;  Laterality: N/A;  . ESOPHAGOGASTRODUODENOSCOPY (EGD) WITH PROPOFOL N/A 10/30/2018   Procedure: ESOPHAGOGASTRODUODENOSCOPY (EGD) WITH PROPOFOL;  Surgeon: Christena Deem, MD;  Location: [facility] ENDOSCOPY;  Service: Endoscopy;  Laterality: N/A;  . EXCISIONAL  HEMORRHOIDECTOMY    . EYE SURGERY    . FRACTURE SURGERY    . HEMORRHOID SURGERY    . HIP SURGERY  2013   Right hip surgery  . JOINT REPLACEMENT    . KNEE ARTHROSCOPY     left and right  . ORIF FEMUR FRACTURE Right 08/07/2018   Procedure: OPEN REDUCTION INTERNAL FIXATION (ORIF) DISTAL FEMUR FRACTURE;  Surgeon: Kennedy Bucker, MD;  Location: [facility] ORS;  Service: Orthopedics;  Laterality: Right;  . REDUCTION MAMMAPLASTY Bilateral YRS AGO  . REPLACEMENT TOTAL KNEE Bilateral   . rotator cuff surgery     blilateral  . TONSILECTOMY/ADENOIDECTOMY WITH MYRINGOTOMY    . VISCERAL ARTERY INTERVENTION N/A 02/25/2019   Procedure: VISCERAL ARTERY INTERVENTION;  Surgeon: Annice Needy, MD;  Location: [facility] INVASIVE CV LAB;  Service: Cardiovascular;  Laterality: N/A;   Family History  Problem Relation Age of Onset  . Heart disease Mother   . Stroke Mother   . Hypertension Mother   . Heart disease Father        myocardial infarction age 50  . Breast cancer Neg Hx    Social History   Socioeconomic History  . Marital status: Widowed    Spouse name: Not on file  . Number of children: 2  . Years of education: 12  .  Highest education level: 12th grade  Occupational History    Comment: insurance agency  Tobacco Use  . Smoking status: Never  . Smokeless tobacco: Never  Vaping Use  . Vaping Use: Never used  Substance and Sexual Activity  . Alcohol use: No    Alcohol/week: 0.0 standard drinks of alcohol  . Drug use: Never  . Sexual activity: Not Currently  Other Topics Concern  . Not on file  Social History Narrative  . Not on file   Social Determinants of Health   Financial Resource Strain: Low Risk  (11/25/2022)   Overall Financial Resource Strain (CARDIA)   . Difficulty of Paying Living Expenses: Not hard at all  Food Insecurity: No Food Insecurity (11/25/2022)   Hunger Vital Sign   . Worried About Programme researcher, broadcasting/film/video in the Last Year: Never true   . Ran Out of Food in the Last Year:  Never true  Transportation Needs: No Transportation Needs (11/25/2022)   PRAPARE - Transportation   . Lack of Transportation (Medical): No   . Lack of Transportation (Non-Medical): No  Recent Concern: Transportation Needs - Unmet Transportation Needs (09/05/2022)   PRAPARE - Transportation   . Lack of Transportation (Medical): Yes   . Lack of Transportation (Non-Medical): Yes  Physical Activity: Insufficiently Active (11/25/2022)   Exercise Vital Sign   . Days of Exercise per Week: 7 days   . Minutes of Exercise per Session: 10 min  Stress: No Stress Concern Present (11/25/2022)   [employer] of Occupational Health - Occupational Stress Questionnaire   . Feeling of Stress : Not at all  Social Connections: Socially Isolated (11/25/2022)   Social Connection and Isolation Panel [NHANES]   . Frequency of Communication with Friends and Family: Three times a week   . Frequency of Social Gatherings with Friends and Family: More than three times a week   . Attends Religious Services: Never   . Active Member of Clubs or Organizations: No   . Attends Banker Meetings: Never   . Marital Status: Widowed     Review of Systems     Objective:     BP (!) 140/90   Pulse 70   Temp 97.9 F (36.6 C)   Resp 16   Ht  (1.575 m)   Wt 169 lb 8 oz (76.9 kg)   SpO2 96%   BMI 31.00 kg/m  Wt Readings from Last 3 Encounters:  12/12/22 169 lb 8 oz (76.9 kg)  11/25/22 167 lb (75.8 kg)  10/30/22 167 lb (75.8 kg)    Physical Exam   Outpatient Encounter Medications as of 12/12/2022  Medication Sig  . albuterol (VENTOLIN HFA) 108 (90 Base) MCG/ACT inhaler INHALE 2 PUFFS INTO THE LUNGS EVERY 6 HOURS AS NEEDED FOR WHEEZING OR SHORTNESS OF BREATH  . aluminum-magnesium hydroxide-simethicone (MAALOX) 200-200-20 MG/5ML SUSP Take 15 mLs by mouth 4 (four) times daily -  before meals and at bedtime.   Marland Kitchen amLODipine (NORVASC) 10 MG tablet TAKE 1 TABLET BY MOUTH DAILY  . aspirin 81 MG EC  tablet Take 81 mg by mouth daily. Every other day  . atorvastatin (LIPITOR) 20 MG tablet TAKE 1 TABLET BY MOUTH AT BEDTIME  . benazepril (LOTENSIN) 40 MG tablet TAKE 1 TABLET BY MOUTH DAILY  . betamethasone dipropionate 0.05 % cream Apply 1 application topically 2 (two) times daily.  . budesonide-formoterol (SYMBICORT) 80-4.5 MCG/ACT inhaler Inhale 2 puffs into the lungs in the morning and at bedtime.  Marland Kitchen  busPIRone (BUSPAR) 10 MG tablet Take 10 mg by mouth 3 (three) times daily.  . calcium citrate-vitamin D (CITRACAL+D) 315-200 MG-UNIT tablet Take 2 tablets by mouth daily.  . Cholecalciferol (VITAMIN D3) 1000 UNITS CAPS Take 1 capsule by mouth daily.  . clobetasol cream (TEMOVATE) 0.05 % Apply 1 application topically See admin instructions. Apply to affected areas of body 1 - 2 times daily as needed for itchy bumps. Avoid applying to face, groin, and axilla. Use as directed.  . cyanocobalamin (VITAMIN B12) 1000 MCG/ML injection Inject 1 mL (1,000 mcg total) into the muscle every 3 (three) months.  . Diclofenac Sodium 3 % GEL Apply topically 2 (two) times daily.  Tery Sanfilippo Sodium (DSS) 100 MG CAPS Take 100 mg by mouth 2 (two) times daily.  . DULoxetine (CYMBALTA) 60 MG capsule Take 60 mg by mouth daily.  . Fluocinolone Acetonide 0.01 % OIL Apply 1-2 drops into ears once to twice daily as needed.  . fluocinonide (LIDEX) 0.05 % external solution Apply topically 2 (two) times daily. Apply drops to scalp for itch  . fluticasone (FLONASE) 50 MCG/ACT nasal spray Place 2 sprays into both nostrils daily.  Marland Kitchen gabapentin (NEURONTIN) 300 MG capsule Take two tablets tid (in am, midday and q hs).  Marland Kitchen HYDROcodone-acetaminophen (NORCO) 10-325 MG tablet Take 1 tablet by mouth 3 (three) times daily.  . [facility]) OINT Apply topically as directed (as discussed).  Marland Kitchen ipratropium (ATROVENT) 0.03 % nasal spray Place 2 sprays into both nostrils every 12 (twelve) hours.  Marland Kitchen ketoconazole (NIZORAL) 2 %  shampoo Apply 1 Application topically See admin instructions. apply three times per week, massage into scalp and leave in for 10 minutes before rinsing out  . levalbuterol (XOPENEX) 0.63 MG/3ML nebulizer solution Take 3 mLs (0.63 mg total) by nebulization every 6 (six) hours as needed for wheezing or shortness of breath.  Marland Kitchen LINZESS 145 MCG CAPS capsule Take 145 mcg by mouth daily.  . magnesium oxide (MAG-OX) 400 MG tablet Take 1 tablet (400 mg total) by mouth daily.  . metoprolol succinate (TOPROL-XL) 50 MG 24 hr tablet TAKE 1 TABLET BY MOUTH DAILY. TAKE WITH OR IMMEDIATELY FOLLOWING A MEAL.  Marland Kitchen morphine (MS CONTIN) 30 MG 12 hr tablet Take 30 mg by mouth every 12 (twelve) hours.  Marland Kitchen morphine (MS CONTIN) 60 MG 12 hr tablet Take 1 tablet (60 mg total) by mouth 2 (two) times daily.  . mupirocin ointment (BACTROBAN) 2 % Apply 1 Application topically 2 (two) times daily.  Marland Kitchen nystatin (MYCOSTATIN/NYSTOP) powder Apply 1 application. topically 2 (two) times daily as needed.  . nystatin cream (MYCOSTATIN) Apply 1 Application topically 2 (two) times daily.  . polyethylene glycol powder (GLYCOLAX/MIRALAX) 17 GM/SCOOP powder MIX 17 GRAMS AS MARKED ON BOTTLE TOP IN 8 OUNCES OF WATER AND DRINK ONCE A DAY AS DIRECTED.  . Simethicone (GAS-X PO) Take 2 tablets by mouth 2 (two) times daily.  . sodium chloride (OCEAN) 0.65 % nasal spray Place 1 spray into the nose as needed.  Marland Kitchen SYNTHROID 100 MCG tablet Take 1 tablet (100 mcg total) by mouth daily.  Marland Kitchen triamcinolone ointment (KENALOG) 0.1 % APPLY TWICE DAILY TO BITES AND RASH UNTIL FLAT AND SMOOTH **DO NOT APPLY TO FACE**  . [DISCONTINUED] furosemide (LASIX) 20 MG tablet TAKE 1 TABLET BY MOUTH DAILY AS NEEDED  . [DISCONTINUED] RABEprazole (ACIPHEX) 20 MG tablet TAKE 1 TABLET BY MOUTH TWICE A DAY BEFORE A MEAL   No facility-administered encounter medications  on file as of 12/12/2022.     Lab Results  Component Value Date   WBC 6.8 10/16/2022   HGB 11.1 (L) 10/16/2022    HCT 33.7 (L) 10/16/2022   PLT 191 10/16/2022   GLUCOSE 95 10/16/2022   CHOL 143 01/21/2022   TRIG 108.0 01/21/2022   HDL 57.90 01/21/2022   LDLCALC 63 01/21/2022   ALT 13 10/16/2022   AST 20 10/16/2022   NA 138 10/16/2022   K 3.9 10/16/2022   CL 102 10/16/2022   CREATININE 0.64 10/16/2022   BUN 13 10/16/2022   CO2 27 10/16/2022   TSH 4.63 01/21/2022   INR 0.89 08/06/2018   HGBA1C 6.0 10/19/2014    No results found.     Assessment & Plan:  There are no diagnoses linked to this encounter.   Dale Paint, MD

## 2022-12-16 NOTE — Assessment & Plan Note (Signed)
Continue aciphex.  Followed by GI.  °

## 2022-12-16 NOTE — Assessment & Plan Note (Signed)
Neck and shoulder pain as outlined.  Saw ortho.  Some limitations - using her walker, etc.  Discussed PT - for evaluation and treatment neck and core muscles.

## 2022-12-16 NOTE — Telephone Encounter (Signed)
Pt is calling wanting to make sure that someone will see her mychart message.

## 2022-12-16 NOTE — Assessment & Plan Note (Signed)
Continue compression hose.  Swelling overall improved.  

## 2022-12-16 NOTE — Assessment & Plan Note (Signed)
Was seen ER - persistent diarrhea and weakness and abdominal pain.  CT - Development of dilatation of the biliary tree with filling defect suggested along distal common duct. Mass lesion is not excluded recommend further evaluation. Overall feeling better.  Appetite improved.  Saw GI. Declined EUS.  Doing better.  Feeling better.  Follow.

## 2022-12-16 NOTE — Assessment & Plan Note (Signed)
Continues on gabapentin.  Follow.  

## 2022-12-16 NOTE — Telephone Encounter (Signed)
Spoke with patient. Could not give examples of heart rates. She says that her watch just notifies when her heart is irregular. Confirmed doing ok- no acute symptoms at this time. Advised she needs to call cardiology to make appt with them and I would let Dr Lorin Picket know as well.

## 2022-12-17 DIAGNOSIS — R001 Bradycardia, unspecified: Secondary | ICD-10-CM | POA: Diagnosis not present

## 2022-12-17 NOTE — Telephone Encounter (Signed)
Called and spoke to Ms Beamesderfer.  She has noticed the rhythm on her watch.  She had one episode where she could feel a brief change.  Only lasted for a few seconds.  Given the change - her watch is detecting,etc. - needs a f/u appt with cardiology.  Sees Dr Darrold Junker.  She has a call into them as well.  Will need f/u appt - question of need for zio monitor, etc.

## 2022-12-17 NOTE — Telephone Encounter (Signed)
May 2 11:30 Kimberly Roy

## 2022-12-17 NOTE — Telephone Encounter (Signed)
Pt had holter monitor on 4/1-4/4. Results being read by Dr Darrold Junker

## 2022-12-17 NOTE — Telephone Encounter (Signed)
Patient aware of appt date and time. 

## 2022-12-25 NOTE — Telephone Encounter (Signed)
Pt called in asking for Trisha LPN. She wants to know if we contacted [facility] for her PT?? Any questions, she's available -[phone]

## 2022-12-26 ENCOUNTER — Other Ambulatory Visit: Payer: Self-pay

## 2022-12-26 ENCOUNTER — Telehealth: Payer: Self-pay | Admitting: Internal Medicine

## 2022-12-26 MED ORDER — DERMACLOUD EX OINT: TOPICAL_OINTMENT | CUTANEOUS | 1 refills | Status: DC

## 2022-12-26 NOTE — Telephone Encounter (Signed)
Dr Lorin Picket placed PT referral to [facility] 4/15. Can we follow up on this? Thank you

## 2022-12-26 NOTE — Telephone Encounter (Signed)
Prescription Request  12/26/2022  LOV: 12/12/2022  What is the name of the medication or equipment? Dermacloud ointment  Have you contacted your pharmacy to request a refill? Yes   Which pharmacy would you like this sent to?   [facility] - [place], [place] - [address] [place] [address] Phone: [phone] Fax: [phone]      Patient notified that their request is being sent to the clinical staff for review and that they should receive a response within 2 business days.   Please advise at [facility] [phone]   I did not see this med in her list.

## 2022-12-26 NOTE — Telephone Encounter (Signed)
[facility]

## 2022-12-27 DIAGNOSIS — J449 Chronic obstructive pulmonary disease, unspecified: Secondary | ICD-10-CM | POA: Diagnosis not present

## 2022-12-27 DIAGNOSIS — I110 Hypertensive heart disease with heart failure: Secondary | ICD-10-CM | POA: Diagnosis not present

## 2022-12-27 DIAGNOSIS — D649 Anemia, unspecified: Secondary | ICD-10-CM | POA: Diagnosis not present

## 2022-12-27 DIAGNOSIS — M5031 Other cervical disc degeneration,  high cervical region: Secondary | ICD-10-CM | POA: Diagnosis not present

## 2022-12-27 DIAGNOSIS — I509 Heart failure, unspecified: Secondary | ICD-10-CM | POA: Diagnosis not present

## 2022-12-27 DIAGNOSIS — I872 Venous insufficiency (chronic) (peripheral): Secondary | ICD-10-CM | POA: Diagnosis not present

## 2022-12-27 DIAGNOSIS — M50323 Other cervical disc degeneration at C6-C7 level: Secondary | ICD-10-CM | POA: Diagnosis not present

## 2022-12-27 DIAGNOSIS — I700 Atherosclerosis of aorta: Secondary | ICD-10-CM | POA: Diagnosis not present

## 2022-12-27 DIAGNOSIS — I6789 Other cerebrovascular disease: Secondary | ICD-10-CM | POA: Diagnosis not present

## 2022-12-31 ENCOUNTER — Telehealth: Payer: Self-pay | Admitting: Internal Medicine

## 2022-12-31 DIAGNOSIS — H903 Sensorineural hearing loss, bilateral: Secondary | ICD-10-CM | POA: Diagnosis not present

## 2022-12-31 NOTE — Telephone Encounter (Signed)
Soni, [facility], physical therapist, [phone]. He needs to know the location of the disc that has a diagnosis for  Degenerative disc disease.

## 2023-01-01 DIAGNOSIS — I700 Atherosclerosis of aorta: Secondary | ICD-10-CM | POA: Diagnosis not present

## 2023-01-01 DIAGNOSIS — D649 Anemia, unspecified: Secondary | ICD-10-CM | POA: Diagnosis not present

## 2023-01-01 DIAGNOSIS — M50323 Other cervical disc degeneration at C6-C7 level: Secondary | ICD-10-CM | POA: Diagnosis not present

## 2023-01-01 DIAGNOSIS — M5031 Other cervical disc degeneration,  high cervical region: Secondary | ICD-10-CM | POA: Diagnosis not present

## 2023-01-01 DIAGNOSIS — I6789 Other cerebrovascular disease: Secondary | ICD-10-CM | POA: Diagnosis not present

## 2023-01-01 DIAGNOSIS — J449 Chronic obstructive pulmonary disease, unspecified: Secondary | ICD-10-CM | POA: Diagnosis not present

## 2023-01-01 DIAGNOSIS — I509 Heart failure, unspecified: Secondary | ICD-10-CM | POA: Diagnosis not present

## 2023-01-01 DIAGNOSIS — I110 Hypertensive heart disease with heart failure: Secondary | ICD-10-CM | POA: Diagnosis not present

## 2023-01-01 DIAGNOSIS — I872 Venous insufficiency (chronic) (peripheral): Secondary | ICD-10-CM | POA: Diagnosis not present

## 2023-01-01 NOTE — Telephone Encounter (Signed)
DDD c3-c7 per xray in chart. Information provided to home health.

## 2023-01-01 NOTE — Telephone Encounter (Signed)
LMTCB

## 2023-01-01 NOTE — Telephone Encounter (Signed)
This info was needed because she was referred for neck pain

## 2023-01-03 DIAGNOSIS — I509 Heart failure, unspecified: Secondary | ICD-10-CM | POA: Diagnosis not present

## 2023-01-03 DIAGNOSIS — J449 Chronic obstructive pulmonary disease, unspecified: Secondary | ICD-10-CM | POA: Diagnosis not present

## 2023-01-03 DIAGNOSIS — M50323 Other cervical disc degeneration at C6-C7 level: Secondary | ICD-10-CM | POA: Diagnosis not present

## 2023-01-03 DIAGNOSIS — I110 Hypertensive heart disease with heart failure: Secondary | ICD-10-CM | POA: Diagnosis not present

## 2023-01-03 DIAGNOSIS — M5031 Other cervical disc degeneration,  high cervical region: Secondary | ICD-10-CM | POA: Diagnosis not present

## 2023-01-03 DIAGNOSIS — I700 Atherosclerosis of aorta: Secondary | ICD-10-CM | POA: Diagnosis not present

## 2023-01-03 DIAGNOSIS — I872 Venous insufficiency (chronic) (peripheral): Secondary | ICD-10-CM | POA: Diagnosis not present

## 2023-01-03 DIAGNOSIS — I6789 Other cerebrovascular disease: Secondary | ICD-10-CM | POA: Diagnosis not present

## 2023-01-03 DIAGNOSIS — D649 Anemia, unspecified: Secondary | ICD-10-CM | POA: Diagnosis not present

## 2023-01-09 NOTE — Telephone Encounter (Signed)
Called and spoke with patient.  She reported that PT of her neck has been denied by [employer].  She reported that not enough information was provided to support the need for PT.  Previous message reported that [facility] would be faxing info to office.  Checked e-faxes and have not received anything yet.

## 2023-01-09 NOTE — Telephone Encounter (Signed)
Patient called and [facility] both called. Dr Lorin Picket did not provide enough info to have PT on her neck. Insurance will not approve. [facility] stated they will be faxing over more info. Patient would like a call back from [place].

## 2023-01-10 NOTE — Telephone Encounter (Signed)
Please hold until receive fax.

## 2023-01-13 ENCOUNTER — Telehealth: Payer: Self-pay | Admitting: Internal Medicine

## 2023-01-13 NOTE — Telephone Encounter (Signed)
Pt called in staying that Dr. Lorin Picket needs to call [employer] and order her oxygen supply/water that goes inside of bottle.   Also, pt stated that as per her last visit with Dr. Lorin Picket, provider was going to put a referral for Psy for her, and pt never heard about this since then.

## 2023-01-14 NOTE — Telephone Encounter (Signed)
Called patient to let her know to contact pulmonary regarding her oxygen supplies. Psychiatry referral was for son. Sent messsage to rasheedah in his chart to follow up.

## 2023-01-21 ENCOUNTER — Telehealth: Payer: Self-pay

## 2023-01-21 NOTE — Telephone Encounter (Signed)
Prescription Request  01/21/2023  LOV: Visit date not found  What is the name of the medication or equipment?  gabapentin (NEURONTIN) 300 MG capsule   Have you contacted your pharmacy to request a refill? Yes   Which pharmacy would you like this sent to?   [facility] - [place], [place] - [address] [place] [address] Phone: [phone] Fax: [phone]   Patient notified that their request is being sent to the clinical staff for review and that they should receive a response within 2 business days.   Please advise at Mobile [phone] (mobile)  Patient states she needs a 90-day supply of this medication and it is due to be refilled.  Patient states she is just about out of this medication.  Patient states she spoke with her pharmacy and asked them not to refill the current prescription they have because it is not a 90-day supply.

## 2023-01-22 MED ORDER — GABAPENTIN 300 MG PO CAPS: ORAL_CAPSULE | ORAL | 1 refills | Status: DC

## 2023-01-22 NOTE — Telephone Encounter (Signed)
Ok for 90 day supply (instead of 30 day).

## 2023-01-22 NOTE — Telephone Encounter (Signed)
Confirmed with patient taking gabapentin 300- 2 capsules tid. Ok to send in 90 day supply? Patient and lynn are requesting 90 day supply because it makes it easier when she is fixing her pill boxes for a few weeks at a time in case she is not able to be there some days or is out of town.

## 2023-01-22 NOTE — Telephone Encounter (Signed)
GABAPENTIN REFILLED. PT AWARE

## 2023-01-24 DIAGNOSIS — M542 Cervicalgia: Secondary | ICD-10-CM

## 2023-01-24 DIAGNOSIS — I110 Hypertensive heart disease with heart failure: Secondary | ICD-10-CM | POA: Diagnosis not present

## 2023-01-24 DIAGNOSIS — Z974 Presence of external hearing-aid: Secondary | ICD-10-CM

## 2023-01-24 DIAGNOSIS — Z5982 Transportation insecurity: Secondary | ICD-10-CM

## 2023-01-24 DIAGNOSIS — Z9842 Cataract extraction status, left eye: Secondary | ICD-10-CM

## 2023-01-24 DIAGNOSIS — M5031 Other cervical disc degeneration,  high cervical region: Secondary | ICD-10-CM | POA: Diagnosis not present

## 2023-01-24 DIAGNOSIS — I890 Lymphedema, not elsewhere classified: Secondary | ICD-10-CM

## 2023-01-24 DIAGNOSIS — E039 Hypothyroidism, unspecified: Secondary | ICD-10-CM

## 2023-01-24 DIAGNOSIS — Z7982 Long term (current) use of aspirin: Secondary | ICD-10-CM

## 2023-01-24 DIAGNOSIS — K219 Gastro-esophageal reflux disease without esophagitis: Secondary | ICD-10-CM

## 2023-01-24 DIAGNOSIS — H9193 Unspecified hearing loss, bilateral: Secondary | ICD-10-CM

## 2023-01-24 DIAGNOSIS — Z96653 Presence of artificial knee joint, bilateral: Secondary | ICD-10-CM

## 2023-01-24 DIAGNOSIS — Z7951 Long term (current) use of inhaled steroids: Secondary | ICD-10-CM

## 2023-01-24 DIAGNOSIS — Z9181 History of falling: Secondary | ICD-10-CM

## 2023-01-24 DIAGNOSIS — Z79899 Other long term (current) drug therapy: Secondary | ICD-10-CM

## 2023-01-24 DIAGNOSIS — Z9089 Acquired absence of other organs: Secondary | ICD-10-CM

## 2023-01-24 DIAGNOSIS — I872 Venous insufficiency (chronic) (peripheral): Secondary | ICD-10-CM | POA: Diagnosis not present

## 2023-01-24 DIAGNOSIS — I509 Heart failure, unspecified: Secondary | ICD-10-CM | POA: Diagnosis not present

## 2023-01-24 DIAGNOSIS — G4733 Obstructive sleep apnea (adult) (pediatric): Secondary | ICD-10-CM

## 2023-01-24 DIAGNOSIS — I700 Atherosclerosis of aorta: Secondary | ICD-10-CM | POA: Diagnosis not present

## 2023-01-24 DIAGNOSIS — F419 Anxiety disorder, unspecified: Secondary | ICD-10-CM

## 2023-01-24 DIAGNOSIS — J449 Chronic obstructive pulmonary disease, unspecified: Secondary | ICD-10-CM | POA: Diagnosis not present

## 2023-01-24 DIAGNOSIS — M1990 Unspecified osteoarthritis, unspecified site: Secondary | ICD-10-CM

## 2023-01-24 DIAGNOSIS — G609 Hereditary and idiopathic neuropathy, unspecified: Secondary | ICD-10-CM

## 2023-01-24 DIAGNOSIS — Z90710 Acquired absence of both cervix and uterus: Secondary | ICD-10-CM

## 2023-01-24 DIAGNOSIS — Z9841 Cataract extraction status, right eye: Secondary | ICD-10-CM

## 2023-01-24 DIAGNOSIS — I10 Essential (primary) hypertension: Secondary | ICD-10-CM

## 2023-01-24 DIAGNOSIS — M50323 Other cervical disc degeneration at C6-C7 level: Secondary | ICD-10-CM | POA: Diagnosis not present

## 2023-01-24 DIAGNOSIS — I6789 Other cerebrovascular disease: Secondary | ICD-10-CM | POA: Diagnosis not present

## 2023-01-24 DIAGNOSIS — K5900 Constipation, unspecified: Secondary | ICD-10-CM

## 2023-01-24 DIAGNOSIS — E785 Hyperlipidemia, unspecified: Secondary | ICD-10-CM

## 2023-01-24 DIAGNOSIS — I279 Pulmonary heart disease, unspecified: Secondary | ICD-10-CM

## 2023-01-24 DIAGNOSIS — D649 Anemia, unspecified: Secondary | ICD-10-CM | POA: Diagnosis not present

## 2023-01-29 ENCOUNTER — Telehealth: Payer: Self-pay | Admitting: Internal Medicine

## 2023-01-29 NOTE — Telephone Encounter (Signed)
Pt called in stating that she has some sore places in vaginal area, which are very uncomfortable  and she's not sure if its a GYN issue or dermatology? Pt would like a phone call back from [place] LPN.

## 2023-01-30 NOTE — Telephone Encounter (Signed)
Pt says she has bumps in the vaginal area that she thinks may be like ingrown hair follicles but is unsure. Scheduled with NP for pelvic exam to determine if GYN referral is appropriate or if able to treat.

## 2023-01-31 ENCOUNTER — Ambulatory Visit (INDEPENDENT_AMBULATORY_CARE_PROVIDER_SITE_OTHER): Payer: Medicare HMO | Admitting: Nurse Practitioner

## 2023-01-31 ENCOUNTER — Encounter: Payer: Self-pay | Admitting: Nurse Practitioner

## 2023-01-31 VITALS — BP 130/60 | HR 63 | Temp 97.9°F | Ht 62.0 in | Wt 173.0 lb

## 2023-01-31 DIAGNOSIS — N750 Cyst of Bartholin's gland: Secondary | ICD-10-CM

## 2023-01-31 NOTE — Patient Instructions (Addendum)
Use warm compress and tylenol.  Bartholin's Cyst  A Bartholin's cyst is a fluid-filled sac that forms as a result of a blockage along the tube (duct) of the Bartholin's gland. Bartholin's glands are small glands in the folds of skin around the vaginal opening (labia). These glands produce fluid to moisten or lubricate the outside of the vagina during sex. A cyst that is not large or infected may not cause any problems or require treatment. If the cyst gets infected with bacteria, it is called a Bartholin's abscess. An abscess may cause symptoms such as pain and swelling and is more likely to require treatment. What are the causes? This condition may be caused by a blocked Bartholin's gland duct. These ducts can become blocked due to natural buildup of fluid and oils. Bacteria inside of the cyst can cause infection. In many cases, the cause is not known. What are the signs or symptoms? Symptoms may include: A bulge or lump on the labia, near the lower opening of the vagina. Discomfort or pain. This may get worse during sex or when walking. Redness, swelling, or fluid draining from the area. These may be signs of an abscess. How severe your symptoms are depends on the size of your cyst and whether it is infected. Infection causes symptoms to get more severe. How is this diagnosed? This condition may be diagnosed based on: Your symptoms and medical history. A physical exam to check for swelling in your vaginal area. You may lie on your back on an exam table and have your feet placed into footrests for the exam. Blood tests to check for infections. Removal of a fluid sample from the cyst or abscess (biopsy) for testing. You may work with a health care provider who specializes in [employer] health (gynecologist) for diagnosis and treatment. How is this treated? If your cyst is small, not infected, and not causing symptoms, you may not need treatment. These cysts often go away on their own, with home  care such as hot baths or warm compresses. If you have a large cyst or an abscess, treatment may include: Antibiotic medicine. A procedure to drain the fluid inside the cyst or abscess. These procedures involve making an incision in the cyst or abscess so that the fluid drains out, and then one of the following may be done: A small, thin tube (catheter) may be placed inside the cyst or abscess so that it does not close and fill up with fluid again (fistulization). The catheter will be removed at a follow-up visit. The edges of the incision may be stitched to your skin so that the cyst or abscess stays open (marsupialization). This allows it to continue to drain and not fill up with fluid again. If you have cysts or abscesses that keep returning (recurring) and have required incision and drainage multiple times, your health care provider may talk with you about surgery to remove the Bartholin's gland. Follow these instructions at home: Medicines Take over-the-counter and prescription medicines only as told by your health care provider. If you were prescribed an antibiotic medicine, take it as told by your health care provider. Do not stop taking the antibiotic even if your condition improves. Managing pain and swelling Try sitz baths to help with pain and swelling. A sitz bath is a warm water bath in which the water only comes up to your hips and should cover your buttocks. You may take sitz baths several times a day. Apply heat to the affected area as  often as needed. Use the heat source that your health care provider recommends, such as a moist heat pack or a heating pad. Place a towel between your skin and the heat source. Leave the heat on for 20-30 minutes. Remove the heat if your skin turns bright red. This is especially important if you are unable to feel pain, heat, or cold. You may have a greater risk of getting burned. Do not fall asleep with the heating pad in place. General  instructions If your cyst or abscess was drained, follow instructions from your health care provider about how to take care of your wound. Use feminine pads as needed to absorb any drainage. Do not push on or squeeze your cyst. Do not have sex until the cyst has gone away or your wound from drainage has healed. Take these steps to help prevent a Bartholin's cyst from returning and to prevent other Bartholin's cysts from developing: Take a bath or shower once a day. Clean your vaginal area with mild soap and water when you bathe. Practice safe sex to prevent STIs. Talk with your health care provider about how to prevent STIs and which forms of birth control (contraception) may be best for you. Keep all follow-up visits. This is important. Contact a health care provider if: You have a fever. You develop increasing redness, swelling, or pain around your cyst. You have fluid, blood, pus, or a bad smell coming from your cyst. You have a cyst that gets larger or comes back. Summary A Bartholin's cyst is a fluid-filled sac that forms as a result of a blockage along the duct of the Bartholin's gland. If your cyst is small, not infected, and not causing symptoms, you may not need any treatment. If you have a large cyst or an abscess, your health care provider may perform a procedure to drain the fluid. If you have cysts or abscesses that keep returning (recurring) and have required incision and drainage multiple times, your health care provider may talk with you about surgery to remove the Bartholin's gland. This information is not intended to replace advice given to you by your health care provider. Make sure you discuss any questions you have with your health care provider. Document Revised: 01/17/2020 Document Reviewed: 01/17/2020 Elsevier Patient Education  2024 [employer].

## 2023-01-31 NOTE — Progress Notes (Signed)
Established Patient Office Visit  Subjective:  Patient ID: Kimberly Roy, female    DOB: February 27, 1931  Age: 89 y.o. MRN: [id number]  CC:  Chief Complaint  Patient presents with   Pelvic Pain    Pt would like to get her vaginal area checked as last week she started developing small white bumps that have gradually increased and is red and painful     HPI  Kimberly Roy presents for bumps on the vaginal area.  The bumps are tender to touch.  The bumps is increased in size and are painful.  She denies any discharge, urinary frequency.  HPI   Past Medical History:  Diagnosis Date   Anemia    Anxiety    Chest pain    CHF (congestive heart failure) (HCC)    Constipation    DDD (degenerative disc disease), cervical    Depression    DVT (deep venous thrombosis) (HCC)    Dysphonia    Dyspnea    Fatty liver    Fatty liver    Headache    Hyperlipidemia    Hyperpiesia    Hypertension    Hypothyroidism    Interstitial cystitis    Left ventricular dysfunction    Lymphedema    Nephrolithiasis    Obstructive sleep apnea    Osteoarthritis    knees/cervical and lumbar spine   Pulmonary hypertension (HCC)    Pulmonary nodules    followed by Dr Meredeth Ide   Pure hypercholesterolemia    Renal cyst    right    Past Surgical History:  Procedure Laterality Date   ABDOMINAL HYSTERECTOMY     ovaries left in place   APPENDECTOMY     Back Surgeries     BACK SURGERY     BREAST REDUCTION SURGERY     3/99   CARDIAC CATHETERIZATION     cataracts Bilateral    CERVICAL SPINE SURGERY     ESOPHAGEAL MANOMETRY N/A 08/02/2015   Procedure: ESOPHAGEAL MANOMETRY (EM);  Surgeon: Elnita Maxwell, MD;  Location: [facility] ENDOSCOPY;  Service: Endoscopy;  Laterality: N/A;   ESOPHAGOGASTRODUODENOSCOPY N/A 02/27/2015   Procedure: ESOPHAGOGASTRODUODENOSCOPY (EGD);  Surgeon: Wallace Cullens, MD;  Location: [facility] ENDOSCOPY;  Service: Gastroenterology;  Laterality: N/A;   ESOPHAGOGASTRODUODENOSCOPY (EGD) WITH  PROPOFOL N/A 10/30/2018   Procedure: ESOPHAGOGASTRODUODENOSCOPY (EGD) WITH PROPOFOL;  Surgeon: Christena Deem, MD;  Location: [facility] ENDOSCOPY;  Service: Endoscopy;  Laterality: N/A;   EXCISIONAL HEMORRHOIDECTOMY     EYE SURGERY     FRACTURE SURGERY     HEMORRHOID SURGERY     HIP SURGERY  2013   Right hip surgery   JOINT REPLACEMENT     KNEE ARTHROSCOPY     left and right   ORIF FEMUR FRACTURE Right 08/07/2018   Procedure: OPEN REDUCTION INTERNAL FIXATION (ORIF) DISTAL FEMUR FRACTURE;  Surgeon: Kennedy Bucker, MD;  Location: [facility] ORS;  Service: Orthopedics;  Laterality: Right;   REDUCTION MAMMAPLASTY Bilateral YRS AGO   REPLACEMENT TOTAL KNEE Bilateral    rotator cuff surgery     blilateral   TONSILECTOMY/ADENOIDECTOMY WITH MYRINGOTOMY     VISCERAL ARTERY INTERVENTION N/A 02/25/2019   Procedure: VISCERAL ARTERY INTERVENTION;  Surgeon: Annice Needy, MD;  Location: [facility] INVASIVE CV LAB;  Service: Cardiovascular;  Laterality: N/A;    Family History  Problem Relation Age of Onset   Heart disease Mother    Stroke Mother    Hypertension Mother    Heart disease Father  myocardial infarction age 73   Breast cancer Neg Hx     Social History   Socioeconomic History   Marital status: Widowed    Spouse name: Not on file   Number of children: 2   Years of education: 45   Highest education level: 12th grade  Occupational History    Comment: insurance agency  Tobacco Use   Smoking status: Never   Smokeless tobacco: Never  Vaping Use   Vaping Use: Never used  Substance and Sexual Activity   Alcohol use: No    Alcohol/week: 0.0 standard drinks of alcohol   Drug use: Never   Sexual activity: Not Currently  Other Topics Concern   Not on file  Social History Narrative   Not on file   Social Determinants of Health   Financial Resource Strain: Low Risk  (11/25/2022)   Overall Financial Resource Strain (CARDIA)    Difficulty of Paying Living Expenses: Not hard at all  Food  Insecurity: No Food Insecurity (11/25/2022)   Hunger Vital Sign    Worried About Running Out of Food in the Last Year: Never true    Ran Out of Food in the Last Year: Never true  Transportation Needs: No Transportation Needs (11/25/2022)   PRAPARE - Administrator, Civil Service (Medical): No    Lack of Transportation (Non-Medical): No  Recent Concern: Transportation Needs - Unmet Transportation Needs (09/05/2022)   PRAPARE - Transportation    Lack of Transportation (Medical): Yes    Lack of Transportation (Non-Medical): Yes  Physical Activity: Insufficiently Active (11/25/2022)   Exercise Vital Sign    Days of Exercise per Week: 7 days    Minutes of Exercise per Session: 10 min  Stress: No Stress Concern Present (11/25/2022)   [employer] of Occupational Health - Occupational Stress Questionnaire    Feeling of Stress : Not at all  Social Connections: Socially Isolated (11/25/2022)   Social Connection and Isolation Panel [NHANES]    Frequency of Communication with Friends and Family: Three times a week    Frequency of Social Gatherings with Friends and Family: More than three times a week    Attends Religious Services: Never    Database administrator or Organizations: No    Attends Banker Meetings: Never    Marital Status: Widowed  Intimate Partner Violence: Not At Risk (11/25/2022)   Humiliation, Afraid, Rape, and Kick questionnaire    Fear of Current or Ex-Partner: No    Emotionally Abused: No    Physically Abused: No    Sexually Abused: No     Outpatient Medications Prior to Visit  Medication Sig Dispense Refill   albuterol (VENTOLIN HFA) 108 (90 Base) MCG/ACT inhaler INHALE 2 PUFFS INTO THE LUNGS EVERY 6 HOURS AS NEEDED FOR WHEEZING OR SHORTNESS OF BREATH 18 g 0   aluminum-magnesium hydroxide-simethicone (MAALOX) 200-200-20 MG/5ML SUSP Take 15 mLs by mouth 4 (four) times daily -  before meals and at bedtime.      amLODipine (NORVASC) 10 MG tablet  TAKE 1 TABLET BY MOUTH DAILY 90 tablet 1   aspirin 81 MG EC tablet Take 81 mg by mouth daily. Every other day     atorvastatin (LIPITOR) 20 MG tablet TAKE 1 TABLET BY MOUTH AT BEDTIME 90 tablet 1   benazepril (LOTENSIN) 40 MG tablet TAKE 1 TABLET BY MOUTH DAILY 90 tablet 1   betamethasone dipropionate 0.05 % cream Apply 1 application topically 2 (two) times daily.  budesonide-formoterol (SYMBICORT) 80-4.5 MCG/ACT inhaler Inhale 2 puffs into the lungs in the morning and at bedtime. 1 each 12   busPIRone (BUSPAR) 10 MG tablet Take 10 mg by mouth 3 (three) times daily.     calcium citrate-vitamin D (CITRACAL+D) 315-200 MG-UNIT tablet Take 2 tablets by mouth daily.     Cholecalciferol (VITAMIN D3) 1000 UNITS CAPS Take 1 capsule by mouth daily.     clobetasol cream (TEMOVATE) 0.05 % Apply 1 application topically See admin instructions. Apply to affected areas of body 1 - 2 times daily as needed for itchy bumps. Avoid applying to face, groin, and axilla. Use as directed. 60 g 1   cyanocobalamin (VITAMIN B12) 1000 MCG/ML injection Inject 1 mL (1,000 mcg total) into the muscle every 3 (three) months. 12 mL 0   Diclofenac Sodium 3 % GEL Apply topically 2 (two) times daily.     Docusate Sodium (DSS) 100 MG CAPS Take 100 mg by mouth 2 (two) times daily.     DULoxetine (CYMBALTA) 60 MG capsule Take 60 mg by mouth daily.     Fluocinolone Acetonide 0.01 % OIL Apply 1-2 drops into ears once to twice daily as needed. 20 mL 3   fluocinonide (LIDEX) 0.05 % external solution Apply topically 2 (two) times daily. Apply drops to scalp for itch 60 mL 3   fluticasone (FLONASE) 50 MCG/ACT nasal spray Place 2 sprays into both nostrils daily. 16 g 6   furosemide (LASIX) 20 MG tablet TAKE 1 TABLET BY MOUTH DAILY AS NEEDED 90 tablet 0   gabapentin (NEURONTIN) 300 MG capsule Take two tablets tid (in am, midday and q hs). 540 capsule 1   HYDROcodone-acetaminophen (NORCO) 10-325 MG tablet Take 1 tablet by mouth 3 (three)  times daily. 10 tablet 0   Infant Care Products [facility]) OINT Apply topically as directed (as discussed). 430 g 1   ipratropium (ATROVENT) 0.03 % nasal spray Place 2 sprays into both nostrils every 12 (twelve) hours. 30 mL 1   ketoconazole (NIZORAL) 2 % shampoo Apply 1 Application topically See admin instructions. apply three times per week, massage into scalp and leave in for 10 minutes before rinsing out 120 mL 3   levalbuterol (XOPENEX) 0.63 MG/3ML nebulizer solution Take 3 mLs (0.63 mg total) by nebulization every 6 (six) hours as needed for wheezing or shortness of breath. 3 mL 1   LINZESS 145 MCG CAPS capsule Take 145 mcg by mouth daily.     magnesium oxide (MAG-OX) 400 MG tablet Take 1 tablet (400 mg total) by mouth daily. 30 tablet 1   metoprolol succinate (TOPROL-XL) 50 MG 24 hr tablet TAKE 1 TABLET BY MOUTH DAILY. TAKE WITH OR IMMEDIATELY FOLLOWING A MEAL. 90 tablet 1   morphine (MS CONTIN) 30 MG 12 hr tablet Take 30 mg by mouth every 12 (twelve) hours.     morphine (MS CONTIN) 60 MG 12 hr tablet Take 1 tablet (60 mg total) by mouth 2 (two) times daily. 10 tablet 0   mupirocin ointment (BACTROBAN) 2 % Apply 1 Application topically 2 (two) times daily. 22 g 0   nystatin (MYCOSTATIN/NYSTOP) powder Apply 1 application. topically 2 (two) times daily as needed. 60 g 0   nystatin cream (MYCOSTATIN) Apply 1 Application topically 2 (two) times daily. 60 g 0   polyethylene glycol powder (GLYCOLAX/MIRALAX) 17 GM/SCOOP powder MIX 17 GRAMS AS MARKED ON BOTTLE TOP IN 8 OUNCES OF WATER AND DRINK ONCE A DAY AS DIRECTED. 527 g 0  RABEprazole (ACIPHEX) 20 MG tablet TAKE 1 TABLET BY MOUTH TWICE A DAY BEFORE A MEAL 180 tablet 1   Simethicone (GAS-X PO) Take 2 tablets by mouth 2 (two) times daily.     sodium chloride (OCEAN) 0.65 % nasal spray Place 1 spray into the nose as needed.     SYNTHROID 100 MCG tablet Take 1 tablet (100 mcg total) by mouth daily. 90 tablet 3   triamcinolone ointment (KENALOG)  0.1 % APPLY TWICE DAILY TO BITES AND RASH UNTIL FLAT AND SMOOTH **DO NOT APPLY TO FACE** 80 g 0   No facility-administered medications prior to visit.    Allergies  Allergen Reactions   Lyrica [Pregabalin] Swelling   Omnicef [Cefdinir] Diarrhea and Nausea And Vomiting   Atarax [Hydroxyzine]     jittery   Dicyclomine Other (See Comments)    Abdominal bloating    Hydroxyzine Hcl     jittery   Levaquin [Levofloxacin] Swelling   Nitrofurantoin Diarrhea   Nucynta Er [Tapentadol Hcl Er] Other (See Comments)    Severe constipation    Oxybutynin Other (See Comments)    Blurred vision   Zoloft [Sertraline Hcl]     Severe headache   Biaxin [Clarithromycin] Other (See Comments) and Rash    Pt does not remember Pt does not remember   Meloxicam Rash   Other Rash    Other Reaction(s): Other (See Comments)  Durabond - redness, Durabond - redness   Sertraline Nausea And Vomiting    Severe headache  Other reaction(s): Headache   Silicone Other (See Comments) and Rash    Durabond - redness Durabond - redness    Durabond - redness   Sulfa Antibiotics Rash    Pt does not remember   Sulfasalazine Rash    Pt does not remember   Tape Rash    Durabond - redness   Tapentadol Other (See Comments) and Rash    Severe constipation Severe constipation Severe constipation Severe constipation Severe constipation   Venofer [Iron Sucrose] Rash    ROS Review of Systems  Genitourinary:        Lumps near the vaginal opening      Objective:    Physical Exam Constitutional:      Appearance: Normal appearance.  HENT:     Head: Normocephalic.     Nose: Nose normal.     Mouth/Throat:     Mouth: Mucous membranes are moist.  Cardiovascular:     Rate and Rhythm: Normal rate and regular rhythm.     Pulses: Normal pulses.     Heart sounds: Normal heart sounds.  Pulmonary:     Effort: Pulmonary effort is normal.     Breath sounds: Normal breath sounds. No stridor. No wheezing.   Abdominal:     General: Bowel sounds are normal.     Palpations: Abdomen is soft. There is no mass.     Tenderness: There is no abdominal tenderness.  Genitourinary:    Comments: Tender painful lumps near the vaginal opening Musculoskeletal:     Cervical back: Normal range of motion and neck supple.  Skin:    General: Skin is warm.     Findings: No rash.  Neurological:     General: No focal deficit present.     Mental Status: She is alert and oriented to person, place, and time. Mental status is at baseline.  Psychiatric:        Mood and Affect: Mood normal.  Behavior: Behavior normal.        Thought Content: Thought content normal.        Judgment: Judgment normal.     BP 130/60   Pulse 63   Temp 97.9 F (36.6 C)   Ht 5\' 2"  (1.575 m)   Wt 173 lb (78.5 kg)   SpO2 95%   BMI 31.64 kg/m  Wt Readings from Last 3 Encounters:  01/31/23 173 lb (78.5 kg)  12/12/22 169 lb 8 oz (76.9 kg)  11/25/22 167 lb (75.8 kg)     Health Maintenance  Topic Date Due   Pneumonia Vaccine 54+ Years old (2 of 2 - PPSV23 or PCV20) 10/18/2023 (Originally 11/29/2015)   MAMMOGRAM  03/20/2023   INFLUENZA VACCINE  04/03/2023   Medicare Annual Wellness (AWV)  11/25/2023   DTaP/Tdap/Td (2 - Td or Tdap) 09/18/2027   DEXA SCAN  Completed   Zoster Vaccines- Shingrix  Completed   HPV VACCINES  Aged Out   COVID-19 Vaccine  Discontinued    There are no preventive care reminders to display for this patient.  Lab Results  Component Value Date   TSH 4.63 01/21/2022   Lab Results  Component Value Date   WBC 6.8 10/16/2022   HGB 11.1 (L) 10/16/2022   HCT 33.7 (L) 10/16/2022   MCV 98.5 10/16/2022   PLT 191 10/16/2022   Lab Results  Component Value Date   NA 142 12/12/2022   K 4.1 12/12/2022   CO2 29 12/12/2022   GLUCOSE 71 12/12/2022   BUN 17 12/12/2022   CREATININE 0.82 12/12/2022   BILITOT 0.9 10/16/2022   ALKPHOS 73 10/16/2022   AST 20 10/16/2022   ALT 13 10/16/2022   PROT 6.6  10/16/2022   ALBUMIN 3.7 10/16/2022   CALCIUM 9.5 12/12/2022   ANIONGAP 9 10/16/2022   GFR 63.26 12/12/2022   Lab Results  Component Value Date   CHOL 143 01/21/2022   Lab Results  Component Value Date   HDL 57.90 01/21/2022   Lab Results  Component Value Date   LDLCALC 63 01/21/2022   Lab Results  Component Value Date   TRIG 108.0 01/21/2022   Lab Results  Component Value Date   CHOLHDL 2 01/21/2022   Lab Results  Component Value Date   HGBA1C 6.0 10/19/2014      Assessment & Plan:  Bartholin cyst Assessment & Plan: Advised patient to use warm bath and anti-inflammatory. Also advised to use warm compress. If symptoms not improving call the office back for further evaluation.      Follow-up: No follow-ups on file.   Kara Dies, NP

## 2023-02-04 ENCOUNTER — Other Ambulatory Visit: Payer: Medicare HMO

## 2023-02-04 ENCOUNTER — Ambulatory Visit: Payer: Medicare HMO | Admitting: Internal Medicine

## 2023-02-04 ENCOUNTER — Telehealth: Payer: Self-pay | Admitting: Internal Medicine

## 2023-02-04 ENCOUNTER — Other Ambulatory Visit: Payer: Self-pay | Admitting: Internal Medicine

## 2023-02-04 DIAGNOSIS — Z1231 Encounter for screening mammogram for malignant neoplasm of breast: Secondary | ICD-10-CM

## 2023-02-04 NOTE — Telephone Encounter (Signed)
Pt would like to be called regarding her son and her physical therapy

## 2023-02-05 NOTE — Telephone Encounter (Signed)
Patient needed to reschedule her sons appointment. Patient also wanted to to mention getting referral to podiatry because she has not had one in years and to resubmit PT referral for her neck. Pt has appt coming up next week- ok per patient to discuss at appt and place referrals.

## 2023-02-11 DIAGNOSIS — M19012 Primary osteoarthritis, left shoulder: Secondary | ICD-10-CM | POA: Diagnosis not present

## 2023-02-11 DIAGNOSIS — M48061 Spinal stenosis, lumbar region without neurogenic claudication: Secondary | ICD-10-CM | POA: Diagnosis not present

## 2023-02-11 DIAGNOSIS — M5416 Radiculopathy, lumbar region: Secondary | ICD-10-CM | POA: Diagnosis not present

## 2023-02-11 DIAGNOSIS — M47812 Spondylosis without myelopathy or radiculopathy, cervical region: Secondary | ICD-10-CM | POA: Diagnosis not present

## 2023-02-11 DIAGNOSIS — N750 Cyst of Bartholin's gland: Secondary | ICD-10-CM | POA: Insufficient documentation

## 2023-02-11 DIAGNOSIS — Z79899 Other long term (current) drug therapy: Secondary | ICD-10-CM | POA: Diagnosis not present

## 2023-02-11 DIAGNOSIS — M519 Unspecified thoracic, thoracolumbar and lumbosacral intervertebral disc disorder: Secondary | ICD-10-CM | POA: Diagnosis not present

## 2023-02-11 DIAGNOSIS — S76312A Strain of muscle, fascia and tendon of the posterior muscle group at thigh level, left thigh, initial encounter: Secondary | ICD-10-CM | POA: Diagnosis not present

## 2023-02-11 DIAGNOSIS — Q675 Congenital deformity of spine: Secondary | ICD-10-CM | POA: Diagnosis not present

## 2023-02-11 DIAGNOSIS — M19011 Primary osteoarthritis, right shoulder: Secondary | ICD-10-CM | POA: Diagnosis not present

## 2023-02-11 NOTE — Assessment & Plan Note (Signed)
Advised patient to use warm bath and anti-inflammatory. Also advised to use warm compress. If symptoms not improving call the office back for further evaluation.

## 2023-02-12 ENCOUNTER — Inpatient Hospital Stay: Payer: Medicare HMO | Admitting: Internal Medicine

## 2023-02-12 ENCOUNTER — Inpatient Hospital Stay: Payer: Medicare HMO | Attending: Internal Medicine

## 2023-02-12 ENCOUNTER — Encounter: Payer: Self-pay | Admitting: Internal Medicine

## 2023-02-12 DIAGNOSIS — E538 Deficiency of other specified B group vitamins: Secondary | ICD-10-CM | POA: Diagnosis not present

## 2023-02-12 DIAGNOSIS — Z8249 Family history of ischemic heart disease and other diseases of the circulatory system: Secondary | ICD-10-CM | POA: Insufficient documentation

## 2023-02-12 DIAGNOSIS — I272 Pulmonary hypertension, unspecified: Secondary | ICD-10-CM | POA: Diagnosis not present

## 2023-02-12 DIAGNOSIS — Z888 Allergy status to other drugs, medicaments and biological substances status: Secondary | ICD-10-CM | POA: Diagnosis not present

## 2023-02-12 DIAGNOSIS — D539 Nutritional anemia, unspecified: Secondary | ICD-10-CM | POA: Insufficient documentation

## 2023-02-12 DIAGNOSIS — Z86718 Personal history of other venous thrombosis and embolism: Secondary | ICD-10-CM | POA: Insufficient documentation

## 2023-02-12 DIAGNOSIS — Z823 Family history of stroke: Secondary | ICD-10-CM | POA: Diagnosis not present

## 2023-02-12 DIAGNOSIS — M1990 Unspecified osteoarthritis, unspecified site: Secondary | ICD-10-CM | POA: Diagnosis not present

## 2023-02-12 DIAGNOSIS — Z8719 Personal history of other diseases of the digestive system: Secondary | ICD-10-CM | POA: Diagnosis not present

## 2023-02-12 DIAGNOSIS — D649 Anemia, unspecified: Secondary | ICD-10-CM | POA: Diagnosis not present

## 2023-02-12 DIAGNOSIS — Z79899 Other long term (current) drug therapy: Secondary | ICD-10-CM | POA: Insufficient documentation

## 2023-02-12 DIAGNOSIS — Z9049 Acquired absence of other specified parts of digestive tract: Secondary | ICD-10-CM | POA: Insufficient documentation

## 2023-02-12 DIAGNOSIS — D693 Immune thrombocytopenic purpura: Secondary | ICD-10-CM | POA: Diagnosis not present

## 2023-02-12 DIAGNOSIS — R5383 Other fatigue: Secondary | ICD-10-CM | POA: Diagnosis not present

## 2023-02-12 DIAGNOSIS — Z881 Allergy status to other antibiotic agents status: Secondary | ICD-10-CM | POA: Diagnosis not present

## 2023-02-12 DIAGNOSIS — Z90710 Acquired absence of both cervix and uterus: Secondary | ICD-10-CM | POA: Insufficient documentation

## 2023-02-12 DIAGNOSIS — I509 Heart failure, unspecified: Secondary | ICD-10-CM | POA: Insufficient documentation

## 2023-02-12 DIAGNOSIS — E039 Hypothyroidism, unspecified: Secondary | ICD-10-CM | POA: Insufficient documentation

## 2023-02-12 DIAGNOSIS — M549 Dorsalgia, unspecified: Secondary | ICD-10-CM | POA: Insufficient documentation

## 2023-02-12 DIAGNOSIS — Z882 Allergy status to sulfonamides status: Secondary | ICD-10-CM | POA: Insufficient documentation

## 2023-02-12 DIAGNOSIS — I110 Hypertensive heart disease with heart failure: Secondary | ICD-10-CM | POA: Diagnosis not present

## 2023-02-12 DIAGNOSIS — G8929 Other chronic pain: Secondary | ICD-10-CM | POA: Insufficient documentation

## 2023-02-12 DIAGNOSIS — G4733 Obstructive sleep apnea (adult) (pediatric): Secondary | ICD-10-CM | POA: Insufficient documentation

## 2023-02-12 LAB — CBC WITH DIFFERENTIAL/PLATELET
Abs Immature Granulocytes: 0.01 10*3/uL (ref 0.00–0.07)
Basophils Absolute: 0 10*3/uL (ref 0.0–0.1)
Basophils Relative: 1 %
Eosinophils Absolute: 0.1 10*3/uL (ref 0.0–0.5)
Eosinophils Relative: 2 %
HCT: 32 % — ABNORMAL LOW (ref 36.0–46.0)
Hemoglobin: 10.5 g/dL — ABNORMAL LOW (ref 12.0–15.0)
Immature Granulocytes: 0 %
Lymphocytes Relative: 27 %
Lymphs Abs: 1.5 10*3/uL (ref 0.7–4.0)
MCH: 33.9 pg (ref 26.0–34.0)
MCHC: 32.8 g/dL (ref 30.0–36.0)
MCV: 103.2 fL — ABNORMAL HIGH (ref 80.0–100.0)
Monocytes Absolute: 0.5 10*3/uL (ref 0.1–1.0)
Monocytes Relative: 8 %
Neutro Abs: 3.5 10*3/uL (ref 1.7–7.7)
Neutrophils Relative %: 62 %
Platelets: 133 10*3/uL — ABNORMAL LOW (ref 150–400)
RBC: 3.1 MIL/uL — ABNORMAL LOW (ref 3.87–5.11)
RDW: 13.7 % (ref 11.5–15.5)
WBC: 5.6 10*3/uL (ref 4.0–10.5)
nRBC: 0 % (ref 0.0–0.2)

## 2023-02-12 LAB — COMPREHENSIVE METABOLIC PANEL
ALT: 15 U/L (ref 0–44)
AST: 21 U/L (ref 15–41)
Albumin: 3.9 g/dL (ref 3.5–5.0)
Alkaline Phosphatase: 82 U/L (ref 38–126)
Anion gap: 12 (ref 5–15)
BUN: 23 mg/dL (ref 8–23)
CO2: 30 mmol/L (ref 22–32)
Calcium: 9.6 mg/dL (ref 8.9–10.3)
Chloride: 100 mmol/L (ref 98–111)
Creatinine, Ser: 0.81 mg/dL (ref 0.44–1.00)
GFR, Estimated: >60 mL/min (ref >60)
Glucose, Bld: 84 mg/dL (ref 70–99)
Potassium: 4.1 mmol/L (ref 3.5–5.1)
Sodium: 142 mmol/L (ref 135–145)
Total Bilirubin: 0.2 mg/dL — ABNORMAL LOW (ref 0.3–1.2)
Total Protein: 6.7 g/dL (ref 6.5–8.1)

## 2023-02-12 LAB — IRON AND TIBC
Iron: 82 ug/dL (ref 28–170)
Saturation Ratios: 29 % (ref 10.4–31.8)
TIBC: 281 ug/dL (ref 250–450)
UIBC: 199 ug/dL

## 2023-02-12 LAB — VITAMIN B12: Vitamin B-12: 656 pg/mL (ref 180–914)

## 2023-02-12 LAB — FERRITIN: Ferritin: 96 ng/mL (ref 11–307)

## 2023-02-12 NOTE — Assessment & Plan Note (Addendum)
#    Chronic macrocytic anemia-unclear etiology-hemoglobin around 10-mild thrombocytopenia greater than 100 platelets- suspect multifactorial. BONE MARROW- June 2023 all the peripheral blood shows mild macrocytic anemia-bone marrow biopsy shows Overall normocellular bone marrow with orderly trilineage  hematopoiesis,-  Essentially absent histiocytic iron stores; conventional cytogenetics normal.  Continues to be clinically suspicious MDS; although not proven on bone marrow biopsy.  Would consider Retacrit if hemoglobin does not improve.  # Today Hb 10.5- Iron sat 25; ferritin-159 Currently On PO iron. [ given Rash sec to IV venofer]; HOLD off venofer for now; continue PO iron.   # Intermittent low platelets > 100-? ITP-  monitor for now.   # B12 def-[ B12 [July 2023]- 156-currently getting B12 injections at home every 3 months.   Care giver-[facility]  # DISPOSITION:  # follow up in 6 months- MD; labs-cbc/bmp; iron studies;ferritin- B12 levels -Dr.B

## 2023-02-12 NOTE — Progress Notes (Signed)
Fatigue/weakness: yes Dyspena: no Light headedness: no Blood in stool: no  

## 2023-02-12 NOTE — Progress Notes (Signed)
[name] CONSULT NOTE  Patient Care Team: [name], MD as PCP - General (Internal Medicine) [name], MD as Attending Physician (Cardiology) [name], MD (Ophthalmology) [name], MD (Otolaryngology) [name], MD as Consulting Physician (Hematology and Oncology)  CHIEF COMPLAINTS/PURPOSE OF CONSULTATION: Anemia  # CHRONIC MILD ANEMIA hb ~11.[Previous Dr.Gittin pt]; DEC 2017- work up [employer]; stool card; Iron/ monoclonal work up- NEGATIVE] ; MARCH 2022- B12 WNL. BONE MARROW BIOPSY-JUNE 2023- DIAGNOSIS:   BONE MARROW, ASPIRATE, CLOT, CORE:  -Overall normocellular bone marrow with orderly trilineage  hematopoiesis, see note.  -  Essentially absent histiocytic iron stores   PERIPHERAL BLOOD:  -Macrocytic anemia and thrombocytopenia.   NOTE:  While the biopsy is inadequate for interpretation the overall aspirate  and clot section are adequate and revealed an otherwise normocellular  bone marrow.  While there is orderly maturation and unremarkable  morphology to the myeloid and erythroid lineages a compensatory  erythroid hyperplasia with left shift is not definitively identified.  There is a mild megakaryocytic hyperplasia with some atypical forms;  however, this may simply be a compensatory megakaryocytic hyperplasia  with left shift.  There is no definitive dysplasia in any of the 3  lineages.  Given the borderline plasmacytosis by aspirate morphology  immunohistochemical stains were performed identifying a normal number of  polyclonal plasma cells.   It should be noted that while my indices did anemia is macrocytic there  is essentially absent histiocytic iron stores.  A morphologic etiology  for the patient's anemia and thrombocytopenia is not identified.  Correlation with pending conventional cytogenetics is recommended to  fully exclude a possible myelodysplastic syndrome.  If clinically  indicated additional molecular  testing such as a next generation myeloid  panel could be considered.   # CHRONIC INTERSTITIAL CYSTITIS/ wheel chair bound  Oncology History   No history exists.    HISTORY OF PRESENTING ILLNESS:  In wheel chair.  Patient's caregiver.   Kimberly Roy 89 y.o.  female  with a history of chronic macrocytic anemia- > 10 years of unclear etiology and mild intermittent thrombocytopenia [clinically suspicious for MDS although not proven on bone marrow biopsy 2023]; mild B12 deficiency is here for follow-up.  Continues to have chronic fatigue.  Chronic joint pains back pain not any worse.  No blood in stools no black-colored stools.  No nausea no vomiting.  No weight loss no night sweats.  Review of Systems  Constitutional:  Positive for malaise/fatigue. Negative for chills, diaphoresis and fever.  HENT:  Negative for nosebleeds and sore throat.   Eyes:  Negative for double vision.  Respiratory:  Negative for cough, hemoptysis, sputum production, shortness of breath and wheezing.   Cardiovascular:  Negative for chest pain, palpitations, orthopnea and leg swelling.  Gastrointestinal:  Negative for abdominal pain, blood in stool, constipation, diarrhea, heartburn, melena, nausea and vomiting.  Genitourinary:  Negative for dysuria, frequency and urgency.  Musculoskeletal:  Positive for back pain and joint pain.  Skin: Negative.  Negative for itching and rash.  Neurological:  Negative for dizziness, tingling, focal weakness, weakness and headaches.  Endo/Heme/Allergies:  Does not bruise/bleed easily.  Psychiatric/Behavioral:  Negative for depression. The patient is not nervous/anxious and does not have insomnia.      MEDICAL HISTORY:  Past Medical History:  Diagnosis Date   Anemia    Anxiety    Chest pain    CHF (congestive heart failure) (HCC)    Constipation    DDD (degenerative  disc disease), cervical    Depression    DVT (deep venous thrombosis) (HCC)    Dysphonia    Dyspnea     Fatty liver    Fatty liver    Headache    Hyperlipidemia    Hyperpiesia    Hypertension    Hypothyroidism    Interstitial cystitis    Left ventricular dysfunction    Lymphedema    Nephrolithiasis    Obstructive sleep apnea    Osteoarthritis    knees/cervical and lumbar spine   Pulmonary hypertension (HCC)    Pulmonary nodules    followed by Dr Meredeth Ide   Pure hypercholesterolemia    Renal cyst    right    SURGICAL HISTORY: Past Surgical History:  Procedure Laterality Date   ABDOMINAL HYSTERECTOMY     ovaries left in place   APPENDECTOMY     Back Surgeries     BACK SURGERY     BREAST REDUCTION SURGERY     3/99   CARDIAC CATHETERIZATION     cataracts Bilateral    CERVICAL SPINE SURGERY     ESOPHAGEAL MANOMETRY N/A 08/02/2015   Procedure: ESOPHAGEAL MANOMETRY (EM);  Surgeon: Elnita Maxwell, MD;  Location: [facility] ENDOSCOPY;  Service: Endoscopy;  Laterality: N/A;   ESOPHAGOGASTRODUODENOSCOPY N/A 02/27/2015   Procedure: ESOPHAGOGASTRODUODENOSCOPY (EGD);  Surgeon: Wallace Cullens, MD;  Location: [facility] ENDOSCOPY;  Service: Gastroenterology;  Laterality: N/A;   ESOPHAGOGASTRODUODENOSCOPY (EGD) WITH PROPOFOL N/A 10/30/2018   Procedure: ESOPHAGOGASTRODUODENOSCOPY (EGD) WITH PROPOFOL;  Surgeon: Christena Deem, MD;  Location: [facility] ENDOSCOPY;  Service: Endoscopy;  Laterality: N/A;   EXCISIONAL HEMORRHOIDECTOMY     EYE SURGERY     FRACTURE SURGERY     HEMORRHOID SURGERY     HIP SURGERY  2013   Right hip surgery   JOINT REPLACEMENT     KNEE ARTHROSCOPY     left and right   ORIF FEMUR FRACTURE Right 08/07/2018   Procedure: OPEN REDUCTION INTERNAL FIXATION (ORIF) DISTAL FEMUR FRACTURE;  Surgeon: Kennedy Bucker, MD;  Location: [facility] ORS;  Service: Orthopedics;  Laterality: Right;   REDUCTION MAMMAPLASTY Bilateral YRS AGO   REPLACEMENT TOTAL KNEE Bilateral    rotator cuff surgery     blilateral   TONSILECTOMY/ADENOIDECTOMY WITH MYRINGOTOMY     VISCERAL ARTERY INTERVENTION N/A 02/25/2019    Procedure: VISCERAL ARTERY INTERVENTION;  Surgeon: Annice Needy, MD;  Location: [facility] INVASIVE CV LAB;  Service: Cardiovascular;  Laterality: N/A;    SOCIAL HISTORY: Social History   Socioeconomic History   Marital status: Widowed    Spouse name: Not on file   Number of children: 2   Years of education: 68   Highest education level: 12th grade  Occupational History    Comment: insurance agency  Tobacco Use   Smoking status: Never   Smokeless tobacco: Never  Vaping Use   Vaping Use: Never used  Substance and Sexual Activity   Alcohol use: No    Alcohol/week: 0.0 standard drinks of alcohol   Drug use: Never   Sexual activity: Not Currently  Other Topics Concern   Not on file  Social History Narrative   Not on file   Social Determinants of Health   Financial Resource Strain: Low Risk  (11/25/2022)   Overall Financial Resource Strain (CARDIA)    Difficulty of Paying Living Expenses: Not hard at all  Food Insecurity: No Food Insecurity (11/25/2022)   Hunger Vital Sign    Worried About Running Out of Food in  the Last Year: Never true    Ran Out of Food in the Last Year: Never true  Transportation Needs: No Transportation Needs (11/25/2022)   PRAPARE - Administrator, Civil Service (Medical): No    Lack of Transportation (Non-Medical): No  Recent Concern: Transportation Needs - Unmet Transportation Needs (09/05/2022)   PRAPARE - Transportation    Lack of Transportation (Medical): Yes    Lack of Transportation (Non-Medical): Yes  Physical Activity: Insufficiently Active (11/25/2022)   Exercise Vital Sign    Days of Exercise per Week: 7 days    Minutes of Exercise per Session: 10 min  Stress: No Stress Concern Present (11/25/2022)   [employer] of Occupational Health - Occupational Stress Questionnaire    Feeling of Stress : Not at all  Social Connections: Socially Isolated (11/25/2022)   Social Connection and Isolation Panel [NHANES]    Frequency of  Communication with Friends and Family: Three times a week    Frequency of Social Gatherings with Friends and Family: More than three times a week    Attends Religious Services: Never    Database administrator or Organizations: No    Attends Banker Meetings: Never    Marital Status: Widowed  Intimate Partner Violence: Not At Risk (11/25/2022)   Humiliation, Afraid, Rape, and Kick questionnaire    Fear of Current or Ex-Partner: No    Emotionally Abused: No    Physically Abused: No    Sexually Abused: No    FAMILY HISTORY: Family History  Problem Relation Age of Onset   Heart disease Mother    Stroke Mother    Hypertension Mother    Heart disease Father        myocardial infarction age 100   Breast cancer Neg Hx     ALLERGIES:  is allergic to lyrica [pregabalin], omnicef [cefdinir], atarax [hydroxyzine], dicyclomine, hydroxyzine hcl, levaquin [levofloxacin], nitrofurantoin, nucynta er [tapentadol hcl er], oxybutynin, zoloft [sertraline hcl], biaxin [clarithromycin], meloxicam, other, sertraline, silicone, sulfa antibiotics, sulfasalazine, tape, tapentadol, and venofer [iron sucrose].  MEDICATIONS:  Current Outpatient Medications  Medication Sig Dispense Refill   albuterol (VENTOLIN HFA) 108 (90 Base) MCG/ACT inhaler INHALE 2 PUFFS INTO THE LUNGS EVERY 6 HOURS AS NEEDED FOR WHEEZING OR SHORTNESS OF BREATH 18 g 0   aluminum-magnesium hydroxide-simethicone (MAALOX) 200-200-20 MG/5ML SUSP Take 15 mLs by mouth 4 (four) times daily -  before meals and at bedtime.      amLODipine (NORVASC) 10 MG tablet TAKE 1 TABLET BY MOUTH DAILY 90 tablet 1   aspirin 81 MG EC tablet Take 81 mg by mouth daily. Every other day     atorvastatin (LIPITOR) 20 MG tablet TAKE 1 TABLET BY MOUTH AT BEDTIME 90 tablet 1   benazepril (LOTENSIN) 40 MG tablet TAKE 1 TABLET BY MOUTH DAILY 90 tablet 1   betamethasone dipropionate 0.05 % cream Apply 1 application topically 2 (two) times daily.      budesonide-formoterol (SYMBICORT) 80-4.5 MCG/ACT inhaler Inhale 2 puffs into the lungs in the morning and at bedtime. 1 each 12   busPIRone (BUSPAR) 10 MG tablet Take 10 mg by mouth 3 (three) times daily.     calcium citrate-vitamin D (CITRACAL+D) 315-200 MG-UNIT tablet Take 2 tablets by mouth daily.     Cholecalciferol (VITAMIN D3) 1000 UNITS CAPS Take 1 capsule by mouth daily.     clobetasol cream (TEMOVATE) 0.05 % Apply 1 application topically See admin instructions. Apply to affected areas of body 1 -  2 times daily as needed for itchy bumps. Avoid applying to face, groin, and axilla. Use as directed. 60 g 1   cyanocobalamin (VITAMIN B12) 1000 MCG/ML injection Inject 1 mL (1,000 mcg total) into the muscle every 3 (three) months. 12 mL 0   Diclofenac Sodium 3 % GEL Apply topically 2 (two) times daily.     Docusate Sodium (DSS) 100 MG CAPS Take 100 mg by mouth 2 (two) times daily.     DULoxetine (CYMBALTA) 60 MG capsule Take 60 mg by mouth daily.     Fluocinolone Acetonide 0.01 % OIL Apply 1-2 drops into ears once to twice daily as needed. 20 mL 3   fluocinonide (LIDEX) 0.05 % external solution Apply topically 2 (two) times daily. Apply drops to scalp for itch 60 mL 3   fluticasone (FLONASE) 50 MCG/ACT nasal spray Place 2 sprays into both nostrils daily. 16 g 6   furosemide (LASIX) 20 MG tablet TAKE 1 TABLET BY MOUTH DAILY AS NEEDED 90 tablet 0   gabapentin (NEURONTIN) 300 MG capsule Take two tablets tid (in am, midday and q hs). 540 capsule 1   HYDROcodone-acetaminophen (NORCO) 10-325 MG tablet Take 1 tablet by mouth 3 (three) times daily. 10 tablet 0   Infant Care Products [facility]) OINT Apply topically as directed (as discussed). 430 g 1   ipratropium (ATROVENT) 0.03 % nasal spray Place 2 sprays into both nostrils every 12 (twelve) hours. 30 mL 1   ketoconazole (NIZORAL) 2 % shampoo Apply 1 Application topically See admin instructions. apply three times per week, massage into scalp and leave  in for 10 minutes before rinsing out 120 mL 3   levalbuterol (XOPENEX) 0.63 MG/3ML nebulizer solution Take 3 mLs (0.63 mg total) by nebulization every 6 (six) hours as needed for wheezing or shortness of breath. 3 mL 1   LINZESS 145 MCG CAPS capsule Take 145 mcg by mouth daily.     magnesium oxide (MAG-OX) 400 MG tablet Take 1 tablet (400 mg total) by mouth daily. 30 tablet 1   metoprolol succinate (TOPROL-XL) 50 MG 24 hr tablet TAKE 1 TABLET BY MOUTH DAILY. TAKE WITH OR IMMEDIATELY FOLLOWING A MEAL. 90 tablet 1   morphine (MS CONTIN) 30 MG 12 hr tablet Take 30 mg by mouth every 12 (twelve) hours.     morphine (MS CONTIN) 60 MG 12 hr tablet Take 1 tablet (60 mg total) by mouth 2 (two) times daily. 10 tablet 0   mupirocin ointment (BACTROBAN) 2 % Apply 1 Application topically 2 (two) times daily. 22 g 0   nystatin (MYCOSTATIN/NYSTOP) powder Apply 1 application. topically 2 (two) times daily as needed. 60 g 0   nystatin cream (MYCOSTATIN) Apply 1 Application topically 2 (two) times daily. 60 g 0   polyethylene glycol powder (GLYCOLAX/MIRALAX) 17 GM/SCOOP powder MIX 17 GRAMS AS MARKED ON BOTTLE TOP IN 8 OUNCES OF WATER AND DRINK ONCE A DAY AS DIRECTED. 527 g 0   RABEprazole (ACIPHEX) 20 MG tablet TAKE 1 TABLET BY MOUTH TWICE A DAY BEFORE A MEAL 180 tablet 1   Simethicone (GAS-X PO) Take 2 tablets by mouth 2 (two) times daily.     sodium chloride (OCEAN) 0.65 % nasal spray Place 1 spray into the nose as needed.     SYNTHROID 100 MCG tablet Take 1 tablet (100 mcg total) by mouth daily. 90 tablet 3   triamcinolone ointment (KENALOG) 0.1 % APPLY TWICE DAILY TO BITES AND RASH UNTIL FLAT AND SMOOTH **DO  NOT APPLY TO FACE** 80 g 0   No current facility-administered medications for this visit.      Marland Kitchen  PHYSICAL EXAMINATION: ECOG PERFORMANCE STATUS: 0 - Asymptomatic  Vitals:   02/12/23 1410  BP: (!) 144/51  Pulse: (!) 55  Temp: 99.1 F (37.3 C)  SpO2: 98%   Filed Weights   02/12/23 1410   Weight: 172 lb (78 kg)     Physical Exam Constitutional:      Comments: Patient in wheelchair because of chronic arthritic problems.  HENT:     Head: Normocephalic and atraumatic.     Mouth/Throat:     Pharynx: No oropharyngeal exudate.  Eyes:     Pupils: Pupils are equal, round, and reactive to light.  Cardiovascular:     Rate and Rhythm: Normal rate and regular rhythm.  Pulmonary:     Effort: Pulmonary effort is normal. No respiratory distress.     Breath sounds: Normal breath sounds. No wheezing.  Abdominal:     General: Bowel sounds are normal. There is no distension.     Palpations: Abdomen is soft. There is no mass.     Tenderness: There is no abdominal tenderness. There is no guarding or rebound.  Musculoskeletal:        General: No tenderness. Normal range of motion.     Cervical back: Normal range of motion and neck supple.  Skin:    General: Skin is warm.  Neurological:     Mental Status: She is alert and oriented to person, place, and time.  Psychiatric:        Mood and Affect: Affect normal.     LABORATORY DATA:  I have reviewed the data as listed Lab Results  Component Value Date   WBC 5.6 02/12/2023   HGB 10.5 (L) 02/12/2023   HCT 32.0 (L) 02/12/2023   MCV 103.2 (H) 02/12/2023   PLT 133 (L) 02/12/2023   Recent Labs    07/11/22 1558 07/13/22 0436 10/08/22 1509 10/16/22 0942 12/12/22 1400 02/12/23 1408  NA 139   < > 139 138 142 142  K 3.7   < > 4.3 3.9 4.1 4.1  CL 99   < > 101 102 102 100  CO2 33*   < > 28 27 29 30   GLUCOSE 209*   < > 129* 95 71 84  BUN 20   < > 18 13 17 23   CREATININE 0.70   < > 0.96 0.64 0.82 0.81  CALCIUM 9.3   < > 9.6 9.0 9.5 9.6  GFRNONAA >60   < > 57* >60  --  >60  PROT 6.8  --   --  6.6  --  6.7  ALBUMIN 3.7  --   --  3.7  --  3.9  AST 25  --   --  20  --  21  ALT 18  --   --  13  --  15  ALKPHOS 70  --   --  73  --  82  BILITOT 0.6  --   --  0.9  --  0.2*   < > = values in this interval not displayed.     RADIOGRAPHIC STUDIES: I have personally reviewed the radiological images as listed and agreed with the findings in the report. No results found.  ASSESSMENT & PLAN:   Symptomatic anemia #  Chronic macrocytic anemia-unclear etiology-hemoglobin around 10-mild thrombocytopenia greater than 100 platelets- suspect multifactorial. BONE MARROW- June 2023 all the peripheral  blood shows mild macrocytic anemia-bone marrow biopsy shows Overall normocellular bone marrow with orderly trilineage  hematopoiesis,-  Essentially absent histiocytic iron stores; conventional cytogenetics normal.  Continues to be clinically suspicious MDS; although not proven on bone marrow biopsy.  Would consider Retacrit if hemoglobin does not improve.  # Today Hb 10.5- Iron sat 25; ferritin-159 Currently On PO iron. [ given Rash sec to IV venofer]; HOLD off venofer for now; continue PO iron.   # Intermittent low platelets > 100-? ITP-  monitor for now.   # B12 def-[ B12 [July 2023]- 156-currently getting B12 injections at home every 3 months.   Care giver-[place]  # DISPOSITION:  # follow up in 6 months- MD; labs-cbc/bmp; iron studies;ferritin- B12 levels -Dr.B   [name], MD 02/12/2023 3:06 PM

## 2023-02-13 ENCOUNTER — Encounter: Payer: Self-pay | Admitting: Internal Medicine

## 2023-02-13 ENCOUNTER — Ambulatory Visit (INDEPENDENT_AMBULATORY_CARE_PROVIDER_SITE_OTHER): Payer: Medicare HMO | Admitting: Internal Medicine

## 2023-02-13 VITALS — BP 138/86 | HR 62 | Temp 98.2°F | Ht 62.0 in | Wt 172.0 lb

## 2023-02-13 DIAGNOSIS — R1310 Dysphagia, unspecified: Secondary | ICD-10-CM

## 2023-02-13 DIAGNOSIS — I700 Atherosclerosis of aorta: Secondary | ICD-10-CM

## 2023-02-13 DIAGNOSIS — R935 Abnormal findings on diagnostic imaging of other abdominal regions, including retroperitoneum: Secondary | ICD-10-CM

## 2023-02-13 DIAGNOSIS — J9611 Chronic respiratory failure with hypoxia: Secondary | ICD-10-CM

## 2023-02-13 DIAGNOSIS — M542 Cervicalgia: Secondary | ICD-10-CM

## 2023-02-13 DIAGNOSIS — E785 Hyperlipidemia, unspecified: Secondary | ICD-10-CM

## 2023-02-13 DIAGNOSIS — I5032 Chronic diastolic (congestive) heart failure: Secondary | ICD-10-CM

## 2023-02-13 DIAGNOSIS — I872 Venous insufficiency (chronic) (peripheral): Secondary | ICD-10-CM

## 2023-02-13 DIAGNOSIS — F439 Reaction to severe stress, unspecified: Secondary | ICD-10-CM

## 2023-02-13 DIAGNOSIS — D649 Anemia, unspecified: Secondary | ICD-10-CM

## 2023-02-13 DIAGNOSIS — I350 Nonrheumatic aortic (valve) stenosis: Secondary | ICD-10-CM

## 2023-02-13 DIAGNOSIS — I10 Essential (primary) hypertension: Secondary | ICD-10-CM | POA: Diagnosis not present

## 2023-02-13 DIAGNOSIS — K219 Gastro-esophageal reflux disease without esophagitis: Secondary | ICD-10-CM

## 2023-02-13 DIAGNOSIS — E033 Postinfectious hypothyroidism: Secondary | ICD-10-CM | POA: Diagnosis not present

## 2023-02-13 DIAGNOSIS — I272 Pulmonary hypertension, unspecified: Secondary | ICD-10-CM

## 2023-02-13 NOTE — Progress Notes (Signed)
Subjective:    Patient ID: Kimberly Roy, female    DOB: 04/16/1931, 89 y.o.   MRN: [id number]  Patient here for  Chief Complaint  Patient presents with   Medical Management of Chronic Issues    HPI Here to follow up regarding her blood pressure and cholesterol. Saw pulmonary 10/30/22 - started on symbicort. Breathing better. Saw cardiology - 72-hour Holter monitor 12/02/18/2024 - 12/05/2022 revealed predominant sinus tachycardia with mean heart rate of 59 bpm, sinus heart rate range 46 to 100 bpm. Frequent premature atrial contractions were present. Frequent brief atrial runs were observed the longest lasting 12 beats. No chest pain reported.  Saw Dr Donneta Romberg yesterday- f/u anemia.  Recommended hold venofer.  Hgb 10.5. continue B12 injections q 3 months. Left arm/shoulder pain - cortisone injection 02/26/23.  Persistent neck pain.  Increased pain - kyphosis.  Discussed PT.  See if we can arrange home health PT to evaluate and treat.  She has noticed recently - when eating - liquid coming up in her throat. She will sometimes have to put a paper towel in his mouth to "soak up the liquid".  Some swallowing change. Reports feeling like she cannot clear her throat.  Increased gas.  Reviewed outside blood pressure readings - varying - mostly 120-140s/60-70s.     Past Medical History:  Diagnosis Date   Anemia    Anxiety    Chest pain    CHF (congestive heart failure) (HCC)    Constipation    DDD (degenerative disc disease), cervical    Depression    DVT (deep venous thrombosis) (HCC)    Dysphonia    Dyspnea    Fatty liver    Fatty liver    Headache    Hyperlipidemia    Hyperpiesia    Hypertension    Hypothyroidism    Interstitial cystitis    Left ventricular dysfunction    Lymphedema    Nephrolithiasis    Obstructive sleep apnea    Osteoarthritis    knees/cervical and lumbar spine   Pulmonary hypertension (HCC)    Pulmonary nodules    followed by Dr Meredeth Ide   Pure hypercholesterolemia     Renal cyst    right   Past Surgical History:  Procedure Laterality Date   ABDOMINAL HYSTERECTOMY     ovaries left in place   APPENDECTOMY     Back Surgeries     BACK SURGERY     BREAST REDUCTION SURGERY     3/99   CARDIAC CATHETERIZATION     cataracts Bilateral    CERVICAL SPINE SURGERY     ESOPHAGEAL MANOMETRY N/A 08/02/2015   Procedure: ESOPHAGEAL MANOMETRY (EM);  Surgeon: Elnita Maxwell, MD;  Location: [facility] ENDOSCOPY;  Service: Endoscopy;  Laterality: N/A;   ESOPHAGOGASTRODUODENOSCOPY N/A 02/27/2015   Procedure: ESOPHAGOGASTRODUODENOSCOPY (EGD);  Surgeon: Wallace Cullens, MD;  Location: [facility] ENDOSCOPY;  Service: Gastroenterology;  Laterality: N/A;   ESOPHAGOGASTRODUODENOSCOPY (EGD) WITH PROPOFOL N/A 10/30/2018   Procedure: ESOPHAGOGASTRODUODENOSCOPY (EGD) WITH PROPOFOL;  Surgeon: Christena Deem, MD;  Location: [facility] ENDOSCOPY;  Service: Endoscopy;  Laterality: N/A;   EXCISIONAL HEMORRHOIDECTOMY     EYE SURGERY     FRACTURE SURGERY     HEMORRHOID SURGERY     HIP SURGERY  2013   Right hip surgery   JOINT REPLACEMENT     KNEE ARTHROSCOPY     left and right   ORIF FEMUR FRACTURE Right 08/07/2018   Procedure: OPEN REDUCTION INTERNAL FIXATION (ORIF) DISTAL  FEMUR FRACTURE;  Surgeon: Kennedy Bucker, MD;  Location: [facility] ORS;  Service: Orthopedics;  Laterality: Right;   REDUCTION MAMMAPLASTY Bilateral YRS AGO   REPLACEMENT TOTAL KNEE Bilateral    rotator cuff surgery     blilateral   TONSILECTOMY/ADENOIDECTOMY WITH MYRINGOTOMY     VISCERAL ARTERY INTERVENTION N/A 02/25/2019   Procedure: VISCERAL ARTERY INTERVENTION;  Surgeon: Annice Needy, MD;  Location: [facility] INVASIVE CV LAB;  Service: Cardiovascular;  Laterality: N/A;   Family History  Problem Relation Age of Onset   Heart disease Mother    Stroke Mother    Hypertension Mother    Heart disease Father        myocardial infarction age 19   Breast cancer Neg Hx    Social History   Socioeconomic History   Marital status:  Widowed    Spouse name: Not on file   Number of children: 2   Years of education: 21   Highest education level: Associate degree: occupational, Scientist, product/process development, or vocational program  Occupational History    Comment: Engineer, site  Tobacco Use   Smoking status: Never   Smokeless tobacco: Never  Vaping Use   Vaping Use: Never used  Substance and Sexual Activity   Alcohol use: No    Alcohol/week: 0.0 standard drinks of alcohol   Drug use: Never   Sexual activity: Not Currently  Other Topics Concern   Not on file  Social History Narrative   Not on file   Social Determinants of Health   Financial Resource Strain: Low Risk  (02/13/2023)   Overall Financial Resource Strain (CARDIA)    Difficulty of Paying Living Expenses: Not very hard  Food Insecurity: No Food Insecurity (02/13/2023)   Hunger Vital Sign    Worried About Running Out of Food in the Last Year: Never true    Ran Out of Food in the Last Year: Never true  Transportation Needs: No Transportation Needs (02/13/2023)   PRAPARE - Administrator, Civil Service (Medical): No    Lack of Transportation (Non-Medical): No  Physical Activity: Inactive (02/13/2023)   Exercise Vital Sign    Days of Exercise per Week: 0 days    Minutes of Exercise per Session: 10 min  Stress: No Stress Concern Present (02/13/2023)   [employer] of Occupational Health - Occupational Stress Questionnaire    Feeling of Stress : Only a little  Social Connections: Moderately Isolated (02/13/2023)   Social Connection and Isolation Panel [NHANES]    Frequency of Communication with Friends and Family: More than three times a week    Frequency of Social Gatherings with Friends and Family: Once a week    Attends Religious Services: 1 to 4 times per year    Active Member of [employer] or Organizations: No    Attends Banker Meetings: Never    Marital Status: Widowed     Review of Systems  Constitutional:  Negative for fever and  unexpected weight change.  HENT:  Negative for congestion and sinus pressure.   Respiratory:  Negative for cough and chest tightness.        Breathing stable.   Cardiovascular:  Negative for chest pain and palpitations.       No increased swelling.  Compression hose - in place.   Gastrointestinal:  Negative for abdominal pain and vomiting.       Swallowing issues as outlined.   Genitourinary:  Negative for difficulty urinating and dysuria.  Musculoskeletal:  Positive for neck  pain.       Arm pain.   Skin:  Negative for color change and rash.  Neurological:  Negative for dizziness and headaches.  Psychiatric/Behavioral:  Negative for agitation and dysphoric mood.        Objective:     BP 138/86   Pulse 62   Temp 98.2 F (36.8 C) (Oral)   Ht 5\' 2"  (1.575 m)   Wt 172 lb (78 kg)   SpO2 94%   BMI 31.46 kg/m  Wt Readings from Last 3 Encounters:  02/13/23 172 lb (78 kg)  02/12/23 172 lb (78 kg)  01/31/23 173 lb (78.5 kg)    Physical Exam Vitals reviewed.  Constitutional:      General: She is not in acute distress.    Appearance: Normal appearance.  HENT:     Head: Normocephalic and atraumatic.     Right Ear: External ear normal.     Left Ear: External ear normal.  Eyes:     General: No scleral icterus.       Right eye: No discharge.        Left eye: No discharge.     Conjunctiva/sclera: Conjunctivae normal.  Neck:     Thyroid: No thyromegaly.  Cardiovascular:     Rate and Rhythm: Normal rate and regular rhythm.  Pulmonary:     Effort: No respiratory distress.     Breath sounds: Normal breath sounds. No wheezing.  Abdominal:     General: Bowel sounds are normal.     Palpations: Abdomen is soft.     Tenderness: There is no abdominal tenderness.  Musculoskeletal:        General: No tenderness.     Cervical back: Neck supple. No tenderness.     Comments: No increased swelling. Kyphosis.    Lymphadenopathy:     Cervical: No cervical adenopathy.  Skin:     Findings: No erythema or rash.  Neurological:     Mental Status: She is alert.  Psychiatric:        Mood and Affect: Mood normal.        Behavior: Behavior normal.      Outpatient Encounter Medications as of 02/13/2023  Medication Sig   albuterol (VENTOLIN HFA) 108 (90 Base) MCG/ACT inhaler INHALE 2 PUFFS INTO THE LUNGS EVERY 6 HOURS AS NEEDED FOR WHEEZING OR SHORTNESS OF BREATH   aluminum-magnesium hydroxide-simethicone (MAALOX) 200-200-20 MG/5ML SUSP Take 15 mLs by mouth 4 (four) times daily -  before meals and at bedtime.    amLODipine (NORVASC) 10 MG tablet TAKE 1 TABLET BY MOUTH DAILY   aspirin 81 MG EC tablet Take 81 mg by mouth daily. Every other day   atorvastatin (LIPITOR) 20 MG tablet TAKE 1 TABLET BY MOUTH AT BEDTIME   benazepril (LOTENSIN) 40 MG tablet TAKE 1 TABLET BY MOUTH DAILY   betamethasone dipropionate 0.05 % cream Apply 1 application topically 2 (two) times daily.   budesonide-formoterol (SYMBICORT) 80-4.5 MCG/ACT inhaler Inhale 2 puffs into the lungs in the morning and at bedtime.   busPIRone (BUSPAR) 10 MG tablet Take 10 mg by mouth 3 (three) times daily.   calcium citrate-vitamin D (CITRACAL+D) 315-200 MG-UNIT tablet Take 2 tablets by mouth daily.   Cholecalciferol (VITAMIN D3) 1000 UNITS CAPS Take 1 capsule by mouth daily.   clobetasol cream (TEMOVATE) 0.05 % Apply 1 application topically See admin instructions. Apply to affected areas of body 1 - 2 times daily as needed for itchy bumps. Avoid applying to face,  groin, and axilla. Use as directed.   cyanocobalamin (VITAMIN B12) 1000 MCG/ML injection Inject 1 mL (1,000 mcg total) into the muscle every 3 (three) months.   Diclofenac Sodium 3 % GEL Apply topically 2 (two) times daily.   Docusate Sodium (DSS) 100 MG CAPS Take 100 mg by mouth 2 (two) times daily.   DULoxetine (CYMBALTA) 60 MG capsule Take 60 mg by mouth daily.   Fluocinolone Acetonide 0.01 % OIL Apply 1-2 drops into ears once to twice daily as needed.    fluocinonide (LIDEX) 0.05 % external solution Apply topically 2 (two) times daily. Apply drops to scalp for itch   fluticasone (FLONASE) 50 MCG/ACT nasal spray Place 2 sprays into both nostrils daily.   furosemide (LASIX) 20 MG tablet TAKE 1 TABLET BY MOUTH DAILY AS NEEDED   gabapentin (NEURONTIN) 300 MG capsule Take two tablets tid (in am, midday and q hs).   HYDROcodone-acetaminophen (NORCO) 10-325 MG tablet Take 1 tablet by mouth 3 (three) times daily.   Infant Care Products [facility]) OINT Apply topically as directed (as discussed).   ipratropium (ATROVENT) 0.03 % nasal spray Place 2 sprays into both nostrils every 12 (twelve) hours.   ketoconazole (NIZORAL) 2 % shampoo Apply 1 Application topically See admin instructions. apply three times per week, massage into scalp and leave in for 10 minutes before rinsing out   levalbuterol (XOPENEX) 0.63 MG/3ML nebulizer solution Take 3 mLs (0.63 mg total) by nebulization every 6 (six) hours as needed for wheezing or shortness of breath.   LINZESS 145 MCG CAPS capsule Take 145 mcg by mouth daily.   magnesium oxide (MAG-OX) 400 MG tablet Take 1 tablet (400 mg total) by mouth daily.   metoprolol succinate (TOPROL-XL) 50 MG 24 hr tablet TAKE 1 TABLET BY MOUTH DAILY. TAKE WITH OR IMMEDIATELY FOLLOWING A MEAL.   morphine (MS CONTIN) 30 MG 12 hr tablet Take 30 mg by mouth every 12 (twelve) hours.   morphine (MS CONTIN) 60 MG 12 hr tablet Take 1 tablet (60 mg total) by mouth 2 (two) times daily.   mupirocin ointment (BACTROBAN) 2 % Apply 1 Application topically 2 (two) times daily.   nystatin (MYCOSTATIN/NYSTOP) powder Apply 1 application. topically 2 (two) times daily as needed.   nystatin cream (MYCOSTATIN) Apply 1 Application topically 2 (two) times daily.   polyethylene glycol powder (GLYCOLAX/MIRALAX) 17 GM/SCOOP powder MIX 17 GRAMS AS MARKED ON BOTTLE TOP IN 8 OUNCES OF WATER AND DRINK ONCE A DAY AS DIRECTED.   RABEprazole (ACIPHEX) 20 MG tablet TAKE 1  TABLET BY MOUTH TWICE A DAY BEFORE A MEAL   Simethicone (GAS-X PO) Take 2 tablets by mouth 2 (two) times daily.   sodium chloride (OCEAN) 0.65 % nasal spray Place 1 spray into the nose as needed.   SYNTHROID 100 MCG tablet Take 1 tablet (100 mcg total) by mouth daily.   triamcinolone ointment (KENALOG) 0.1 % APPLY TWICE DAILY TO BITES AND RASH UNTIL FLAT AND SMOOTH **DO NOT APPLY TO FACE**   No facility-administered encounter medications on file as of 02/13/2023.     Lab Results  Component Value Date   WBC 5.6 02/12/2023   HGB 10.5 (L) 02/12/2023   HCT 32.0 (L) 02/12/2023   PLT 133 (L) 02/12/2023   GLUCOSE 84 02/12/2023   CHOL 143 01/21/2022   TRIG 108.0 01/21/2022   HDL 57.90 01/21/2022   LDLCALC 63 01/21/2022   ALT 15 02/12/2023   AST 21 02/12/2023   NA 142 02/12/2023  K 4.1 02/12/2023   CL 100 02/12/2023   CREATININE 0.81 02/12/2023   BUN 23 02/12/2023   CO2 30 02/12/2023   TSH 4.63 01/21/2022   INR 0.89 08/06/2018   HGBA1C 6.0 10/19/2014    No results found.     Assessment & Plan:  Primary hypertension Assessment & Plan: Blood pressure as outlined.  Continue benazepril, amlodipine, lasix and metoprolol.  Follow pressures.  Follow metabolic panel.    Hyperlipidemia, unspecified hyperlipidemia type Assessment & Plan: On lipitor.  Low cholesterol diet and exercise.  Follow lipid panel and liver function tests.    Orders: -     Lipid panel; Future -     TSH; Future  Dysphagia, unspecified type -     DG ESOPHAGUS W SINGLE CM (SOL OR THIN BA); Future  Abnormal CT of the abdomen Assessment & Plan: Was seen ER - persistent diarrhea and weakness and abdominal pain.  CT - Development of dilatation of the biliary tree with filling defect suggested along distal common duct. Mass lesion is not excluded recommend further evaluation. Overall feeling better.  Appetite improved.  Saw GI. Declined EUS.    Anemia, unspecified type Assessment & Plan: Saw Dr Donneta Romberg  yesterday- f/u anemia.  Recommended hold venofer.  Hgb 10.5. continue B12 injections q 3 months.    Aortic atherosclerosis (HCC) Assessment & Plan: Continue lipitor.    Aortic valve stenosis, etiology of cardiac valve disease unspecified Assessment & Plan: Followed by cardiology.     Chronic diastolic CHF (congestive heart failure) (HCC) Assessment & Plan: Euvolemic clinically.  Continue benazepril, metoprolol and lasix.    Stress Assessment & Plan: Followed by psychiatry.  Appears to be stable.  Follow.    Pulmonary hypertension (HCC) Assessment & Plan: Followed by pulmonary.  Continue nighttime oxygen.    Neck pain Assessment & Plan: Neck and shoulder pain as outlined.  Saw ortho.  Some limitations - using her walker, ADLs, etc.  Discussed PT - for evaluation and treatment neck and core muscles.    Postinfectious hypothyroidism Assessment & Plan: On thyroid replacement.  Follow tsh.    Gastroesophageal reflux disease, unspecified whether esophagitis present Assessment & Plan: Continue aciphex.  Has been followed by GI. Symptoms as outlined - liquid coming up in throat, not being able to clear her throat, swallowing change and increased gas.  Will get her back in with GI.  Also schedule UGI with barium swallow.     Chronic venous insufficiency Assessment & Plan: Continue compression hose.  Swelling overall improved.    Chronic respiratory failure with hypoxia (HCC) Assessment & Plan: Uses oxygen at night.  Continue oxygen - nocturnal.          Dale [place], MD

## 2023-02-16 DIAGNOSIS — S93401A Sprain of unspecified ligament of right ankle, initial encounter: Secondary | ICD-10-CM | POA: Diagnosis not present

## 2023-02-17 DIAGNOSIS — F419 Anxiety disorder, unspecified: Secondary | ICD-10-CM | POA: Diagnosis not present

## 2023-02-17 DIAGNOSIS — F5105 Insomnia due to other mental disorder: Secondary | ICD-10-CM | POA: Diagnosis not present

## 2023-02-17 DIAGNOSIS — F330 Major depressive disorder, recurrent, mild: Secondary | ICD-10-CM | POA: Diagnosis not present

## 2023-02-22 ENCOUNTER — Telehealth: Payer: Self-pay | Admitting: Internal Medicine

## 2023-02-22 ENCOUNTER — Emergency Department: Payer: Medicare HMO

## 2023-02-22 ENCOUNTER — Encounter: Payer: Self-pay | Admitting: Internal Medicine

## 2023-02-22 ENCOUNTER — Emergency Department
Admission: EM | Admit: 2023-02-22 | Discharge: 2023-02-22 | Disposition: A | Discharge status: Home / Self Care | Payer: Medicare HMO | Attending: Student in an Organized Health Care Education/Training Program | Admitting: Student in an Organized Health Care Education/Training Program

## 2023-02-22 ENCOUNTER — Other Ambulatory Visit: Payer: Self-pay

## 2023-02-22 ENCOUNTER — Encounter: Payer: Self-pay | Admitting: Radiology

## 2023-02-22 DIAGNOSIS — M542 Cervicalgia: Secondary | ICD-10-CM

## 2023-02-22 DIAGNOSIS — S0990XA Unspecified injury of head, initial encounter: Secondary | ICD-10-CM | POA: Diagnosis not present

## 2023-02-22 DIAGNOSIS — M5030 Other cervical disc degeneration, unspecified cervical region: Secondary | ICD-10-CM | POA: Diagnosis not present

## 2023-02-22 DIAGNOSIS — M545 Low back pain, unspecified: Secondary | ICD-10-CM | POA: Diagnosis not present

## 2023-02-22 DIAGNOSIS — S20222A Contusion of left back wall of thorax, initial encounter: Secondary | ICD-10-CM

## 2023-02-22 DIAGNOSIS — E039 Hypothyroidism, unspecified: Secondary | ICD-10-CM | POA: Insufficient documentation

## 2023-02-22 DIAGNOSIS — W06XXXA Fall from bed, initial encounter: Secondary | ICD-10-CM | POA: Insufficient documentation

## 2023-02-22 DIAGNOSIS — I110 Hypertensive heart disease with heart failure: Secondary | ICD-10-CM | POA: Diagnosis not present

## 2023-02-22 DIAGNOSIS — I6789 Other cerebrovascular disease: Secondary | ICD-10-CM | POA: Diagnosis not present

## 2023-02-22 DIAGNOSIS — M25511 Pain in right shoulder: Secondary | ICD-10-CM | POA: Diagnosis not present

## 2023-02-22 DIAGNOSIS — S199XXA Unspecified injury of neck, initial encounter: Secondary | ICD-10-CM | POA: Diagnosis not present

## 2023-02-22 DIAGNOSIS — S51812A Laceration without foreign body of left forearm, initial encounter: Secondary | ICD-10-CM | POA: Insufficient documentation

## 2023-02-22 DIAGNOSIS — M40204 Unspecified kyphosis, thoracic region: Secondary | ICD-10-CM | POA: Diagnosis not present

## 2023-02-22 DIAGNOSIS — M25519 Pain in unspecified shoulder: Secondary | ICD-10-CM | POA: Diagnosis not present

## 2023-02-22 DIAGNOSIS — W19XXXA Unspecified fall, initial encounter: Secondary | ICD-10-CM | POA: Diagnosis not present

## 2023-02-22 DIAGNOSIS — M19012 Primary osteoarthritis, left shoulder: Secondary | ICD-10-CM | POA: Diagnosis not present

## 2023-02-22 DIAGNOSIS — I700 Atherosclerosis of aorta: Secondary | ICD-10-CM | POA: Diagnosis not present

## 2023-02-22 DIAGNOSIS — S4992XA Unspecified injury of left shoulder and upper arm, initial encounter: Secondary | ICD-10-CM | POA: Diagnosis not present

## 2023-02-22 DIAGNOSIS — R0902 Hypoxemia: Secondary | ICD-10-CM | POA: Diagnosis not present

## 2023-02-22 DIAGNOSIS — S299XXA Unspecified injury of thorax, initial encounter: Secondary | ICD-10-CM | POA: Diagnosis not present

## 2023-02-22 DIAGNOSIS — I509 Heart failure, unspecified: Secondary | ICD-10-CM | POA: Diagnosis not present

## 2023-02-22 DIAGNOSIS — Z9682 Presence of neurostimulator: Secondary | ICD-10-CM | POA: Diagnosis not present

## 2023-02-22 DIAGNOSIS — S300XXA Contusion of lower back and pelvis, initial encounter: Secondary | ICD-10-CM | POA: Diagnosis not present

## 2023-02-22 DIAGNOSIS — M19011 Primary osteoarthritis, right shoulder: Secondary | ICD-10-CM | POA: Diagnosis not present

## 2023-02-22 DIAGNOSIS — M47812 Spondylosis without myelopathy or radiculopathy, cervical region: Secondary | ICD-10-CM | POA: Diagnosis not present

## 2023-02-22 DIAGNOSIS — M25512 Pain in left shoulder: Secondary | ICD-10-CM | POA: Diagnosis not present

## 2023-02-22 DIAGNOSIS — I10 Essential (primary) hypertension: Secondary | ICD-10-CM | POA: Diagnosis not present

## 2023-02-22 LAB — URINALYSIS, ROUTINE W REFLEX MICROSCOPIC
Bacteria, UA: NONE SEEN
Bilirubin Urine: NEGATIVE
Glucose, UA: NEGATIVE mg/dL
Hgb urine dipstick: NEGATIVE
Ketones, ur: NEGATIVE mg/dL
Nitrite: NEGATIVE
Protein, ur: NEGATIVE mg/dL
Specific Gravity, Urine: 1.01 (ref 1.005–1.030)
pH: 7 (ref 5.0–8.0)

## 2023-02-22 NOTE — Discharge Instructions (Signed)
Take Tylenol if needed for pain.  Keep your scheduled appointment for your shoulder.  Return to the ER for symptoms of concern if unable to schedule an appointment with primary care.

## 2023-02-22 NOTE — ED Notes (Signed)
Patient taken to imaging. 

## 2023-02-22 NOTE — ED Notes (Signed)
Skin tear on left forearm was cleaned and dressed with tegaderm.

## 2023-02-22 NOTE — Assessment & Plan Note (Signed)
Blood pressure as outlined.  Continue benazepril, amlodipine, lasix and metoprolol.  Follow pressures.  Follow metabolic panel.  

## 2023-02-22 NOTE — Assessment & Plan Note (Signed)
Continue aciphex.  Has been followed by GI. Symptoms as outlined - liquid coming up in throat, not being able to clear her throat, swallowing change and increased gas.  Will get her back in with GI.  Also schedule UGI with barium swallow.   

## 2023-02-22 NOTE — ED Triage Notes (Signed)
Pt was attempting to get to her bedside commode and tried to reach for it to steady herself and it had been moved farther away. Pt fell on her left side. Pt complains of left shoulder pain as well as a skin tear to her left arm.

## 2023-02-22 NOTE — Assessment & Plan Note (Signed)
Followed by pulmonary.  Continue nighttime oxygen.  

## 2023-02-22 NOTE — Telephone Encounter (Signed)
She has seen Dr Timothy Lasso [facility]). Needs a f/u appt with [facility]- increased problems with liquid coming up in her throat, constant clearing her throat and swallowing change.  Also, let me know if there is anything more I need to do to get PT for her neck.  If need referral to outpt PT,let me know.

## 2023-02-22 NOTE — Assessment & Plan Note (Signed)
Followed by cardiology 

## 2023-02-22 NOTE — Assessment & Plan Note (Signed)
Was seen ER - persistent diarrhea and weakness and abdominal pain.  CT - Development of dilatation of the biliary tree with filling defect suggested along distal common duct. Mass lesion is not excluded recommend further evaluation. Overall feeling better.  Appetite improved.  Saw GI. Declined EUS.

## 2023-02-22 NOTE — ED Notes (Signed)
ED tech [name] at bedside to place [place]. Patient states she just has chronic pain, but no acute pain while not moving. Patient given a pillow for comfort and the call bell.

## 2023-02-22 NOTE — Assessment & Plan Note (Signed)
Neck and shoulder pain as outlined.  Saw ortho.  Some limitations - using her walker, ADLs, etc.  Discussed PT - for evaluation and treatment neck and core muscles.

## 2023-02-22 NOTE — Assessment & Plan Note (Signed)
Followed by psychiatry.  Appears to be stable.  Follow.  

## 2023-02-22 NOTE — Assessment & Plan Note (Signed)
Continue compression hose.  Swelling overall improved.  

## 2023-02-22 NOTE — ED Provider Notes (Signed)
[facility] Provider Note    Event Date/Time   First MD Initiated Contact with Patient 02/22/23 [phone]     (approximate)   History   Fall (Left shoulder pain)   HPI  Kimberly Roy is a 89 y.o. female  with history of osteoarthritis, hypertension, hyperlipidemia, hypothyroidism, CHF and as listed in EMR presents to the emergency department for treatment and evaluation after a mechanical, nonsyncopal fall this morning.  She was trying to transition from her bed to the potty chair and it was further away than she realized causing her to fall out of bed onto her left back/shoulder area.  She did not strike her head or lose consciousness.  She is not complaining of pain in the left shoulder, right shoulder, thoracic, and cervical areas..      Physical Exam   Triage Vital Signs: ED Triage Vitals  Enc Vitals Group     BP 02/22/23 0905 (!) 152/48     Pulse Rate 02/22/23 0905 (!) 53     Resp 02/22/23 0905 16     Temp 02/22/23 0905 97.7 F (36.5 C)     Temp Source 02/22/23 0905 Oral     SpO2 02/22/23 0859 94 %     Weight 02/22/23 0902 172 lb (78 kg)     Height 02/22/23 0902 5\' 2"  (1.575 m)     Head Circumference --      Peak Flow --      Pain Score 02/22/23 1155 4     Pain Loc --      Pain Edu? --      Excl. in GC? --     Most recent vital signs: Vitals:   02/22/23 1130 02/22/23 1145  BP: (!) 137/49   Pulse: (!) 58 99  Resp:    Temp:    SpO2: 90% 99%    General: Awake, no distress.  CV:  Good peripheral perfusion.  Resp:  Normal effort.  Abd:  No distention.  Other:  Large hematoma noted to the posterior shoulder and scapular area.  Skin tear noted to the left forearm.   ED Results / Procedures / Treatments   Labs (all labs ordered are listed, but only abnormal results are displayed) Labs Reviewed  URINALYSIS, ROUTINE W REFLEX MICROSCOPIC - Abnormal; Notable for the following components:      Result Value   Color, Urine YELLOW (*)     APPearance CLEAR (*)    Leukocytes,Ua SMALL (*)    All other components within normal limits     EKG  Not indicated   RADIOLOGY  Image and radiology report reviewed and interpreted by me. Radiology report consistent with the same.  CT of the head, cervical spine, and thorax are all negative for acute changes.  Imaging of the right and left shoulder are also both negative for acute concerns.  PROCEDURES:  Critical Care performed: No  Procedures   MEDICATIONS ORDERED IN ED:  Medications - No data to display   IMPRESSION / MDM / ASSESSMENT AND PLAN / ED COURSE   I have reviewed the triage note.  Differential diagnosis includes, but is not limited to, cervical vertebral injury, thoracic vertebral injury, shoulder strain, humeral head fracture  Patient's presentation is most consistent with acute illness / injury with system symptoms.  89 year old female presenting to the emergency department after mechanical, nonsyncopal fall.  See HPI for further details.  CT scans of the head, cervical spine, and thoracic spine are  all negative as are images of both shoulders.  Skin tear cleaned and dressed by nursing staff. Patient felt reassured by negative workup. She will take Tylenol if needed for pain. ER return precautions discussed.      FINAL CLINICAL IMPRESSION(S) / ED DIAGNOSES   Final diagnoses:  Contusion, back, left, initial encounter  Acute pain of both shoulders     Rx / DC Orders   ED Discharge Orders     None        Note:  This document was prepared using Dragon voice recognition software and may include unintentional dictation errors.   Chinita Pester, FNP 02/22/23 1231    Willy Eddy, MD 02/22/23 [phone]

## 2023-02-22 NOTE — Assessment & Plan Note (Signed)
Continue lipitor  ?

## 2023-02-22 NOTE — Assessment & Plan Note (Signed)
Continue aciphex.  Has been followed by GI. Symptoms as outlined - liquid coming up in throat, not being able to clear her throat, swallowing change and increased gas.  Will get her back in with GI.  Also schedule UGI with barium swallow.

## 2023-02-22 NOTE — Assessment & Plan Note (Signed)
Euvolemic clinically.  Continue benazepril, metoprolol and lasix.  

## 2023-02-22 NOTE — Assessment & Plan Note (Signed)
On lipitor.  Low cholesterol diet and exercise.  Follow lipid panel and liver function tests.   

## 2023-02-22 NOTE — Assessment & Plan Note (Signed)
On thyroid replacement.  Follow tsh.  

## 2023-02-22 NOTE — Assessment & Plan Note (Signed)
Saw Dr Donneta Romberg yesterday- f/u anemia.  Recommended hold venofer.  Hgb 10.5. continue B12 injections q 3 months.

## 2023-02-22 NOTE — Assessment & Plan Note (Signed)
Uses oxygen at night.  Continue oxygen - nocturnal.     

## 2023-02-24 DIAGNOSIS — S93601D Unspecified sprain of right foot, subsequent encounter: Secondary | ICD-10-CM | POA: Diagnosis not present

## 2023-02-24 NOTE — Telephone Encounter (Signed)
I put in new referral for her neck. Will discuss GI appt with patient.

## 2023-02-24 NOTE — Telephone Encounter (Signed)
LVM to call back to follow up per Dr Lorin Picket note below

## 2023-02-24 NOTE — Addendum Note (Signed)
Addended by: Rita Ohara D on: 02/24/2023 04:19 PM   Modules accepted: Orders

## 2023-02-25 NOTE — Telephone Encounter (Signed)
Pt called stating she was checking on a referral for her neck and she need a ER follow-up appointment but there are no appointments within two weeks. Pt stated she went to the ER over the weekend.

## 2023-02-26 ENCOUNTER — Telehealth: Payer: Self-pay | Admitting: Internal Medicine

## 2023-02-26 ENCOUNTER — Ambulatory Visit: Payer: Self-pay

## 2023-02-26 ENCOUNTER — Telehealth: Payer: Self-pay | Admitting: *Deleted

## 2023-02-26 ENCOUNTER — Other Ambulatory Visit: Payer: Self-pay | Admitting: Internal Medicine

## 2023-02-26 DIAGNOSIS — M25512 Pain in left shoulder: Secondary | ICD-10-CM | POA: Diagnosis not present

## 2023-02-26 DIAGNOSIS — M25511 Pain in right shoulder: Secondary | ICD-10-CM | POA: Diagnosis not present

## 2023-02-26 NOTE — Telephone Encounter (Signed)
Pt scheduled for ED f/u. Will schedule GI appt and let pt know.

## 2023-02-26 NOTE — Chronic Care Management (AMB) (Signed)
   02/26/2023  Kimberly Roy [birth year] [id number]   Reason for Encounter: Patient is not currently enrolled in the CCM program. CCM status changed to previously enrolled  Alto Denver RN, MSN, CCM RN Care Manager  Chronic Care Management Direct Number: [phone]

## 2023-02-26 NOTE — Telephone Encounter (Signed)
[employer] representative called in stating that they will like to let Dr. Lorin Picket knows that pt told them she's was going to hold on for home pt until after 03/10/23.

## 2023-02-26 NOTE — Telephone Encounter (Signed)
FYI for you. 

## 2023-02-26 NOTE — Transitions of Care (Post Inpatient/ED Visit) (Signed)
02/26/2023  Name: Kimberly Roy MRN: [id number] DOB: [birth year]  Today's TOC FU Call Status: Today's TOC FU Call Status:: Successful TOC FU Call Competed TOC FU Call Complete Date: 02/26/23  Transition Care Management Follow-up Telephone Call Date of Discharge: 02/23/23 Discharge Facility: [facility]) Type of Discharge: Emergency Department Reason for ED Visit: Other: (contusion) How have you been since you were released from the hospital?: Better Any questions or concerns?: No  Items Reviewed: Did you receive and understand the discharge instructions provided?: Yes Medications obtained,verified, and reconciled?: Yes (Medications Reviewed) Any new allergies since your discharge?: No Dietary orders reviewed?: No Do you have support at home?: Yes People in Home: alone Name of Support/Comfort Primary Source: Debbie  Medications Reviewed Today: Medications Reviewed Today     Reviewed by Luella Cook, RN (Case Manager) on 02/26/23 at 1553  Med List Status: <None>   Medication Order Taking? Sig Documenting Provider Last Dose Status Informant  albuterol (VENTOLIN HFA) 108 (90 Base) MCG/ACT inhaler [id number] Yes INHALE 2 PUFFS INTO THE LUNGS EVERY 6 HOURS AS NEEDED FOR WHEEZING OR SHORTNESS OF Wandra Scot, MD Taking Active Self  aluminum-magnesium hydroxide-simethicone (MAALOX) 200-200-20 MG/5ML SUSP [id number] Yes Take 15 mLs by mouth 4 (four) times daily -  before meals and at bedtime.  [provider] Taking Active Self           Med Note Nelia Shi   Fri Jul 12, 2022  1:32 AM)    amLODipine (NORVASC) 10 MG tablet [id number] Yes TAKE 1 TABLET BY MOUTH DAILY Dale [place], MD Taking Active   aspirin 81 MG EC tablet [id number] Yes Take 81 mg by mouth daily. Every other day [provider] Taking Active Self  atorvastatin (LIPITOR) 20 MG tablet [id number] Yes TAKE 1 TABLET BY MOUTH AT BEDTIME Dale [place], MD Taking  Active   benazepril (LOTENSIN) 40 MG tablet [id number] Yes TAKE 1 TABLET BY MOUTH DAILY Dale [place], MD Taking Active   betamethasone dipropionate 0.05 % cream [id number] Yes Apply 1 application topically 2 (two) times daily. [provider] Taking Active Self           Med Note Aundria Rud, Landry Dyke   Fri Jul 12, 2022  1:32 AM)    budesonide-formoterol [facility]) 80-4.5 MCG/ACT inhaler [id number] Yes Inhale 2 puffs into the lungs in the morning and at bedtime. [name], MD Taking Active   busPIRone (BUSPAR) 10 MG tablet [id number] Yes Take 10 mg by mouth 3 (three) times daily. [provider] Taking Active Self  calcium citrate-vitamin D (CITRACAL+D) 315-200 MG-UNIT tablet [id number] Yes Take 2 tablets by mouth daily. [provider] Taking Active Self  Cholecalciferol (VITAMIN D3) 1000 UNITS CAPS [id number] Yes Take 1 capsule by mouth daily. [provider] Taking Active Self  clobetasol cream (TEMOVATE) 0.05 % [id number] Yes Apply 1 application topically See admin instructions. Apply to affected areas of body 1 - 2 times daily as needed for itchy bumps. Avoid applying to face, groin, and axilla. Use as directed. Willeen Niece, MD Taking Active Self  cyanocobalamin (VITAMIN B12) 1000 MCG/ML injection [id number] Yes Inject 1 mL (1,000 mcg total) into the muscle every 3 (three) months. [name], MD Taking Active Self  Diclofenac Sodium 3 % GEL [id number] Yes Apply topically 2 (two) times daily. [provider] Taking Active Self           Med Note Aundria Rud, Landry Dyke   Fri Jul 12, 2022  1:32 AM)    Docusate Sodium (DSS) 100 MG CAPS [id number] Yes Take 100 mg by mouth 2 (two) times daily. [provider] Taking Active Self  DULoxetine (CYMBALTA) 60 MG capsule [id number] Yes Take 60 mg by mouth daily. [provider] Taking Active Self  Fluocinolone Acetonide 0.01 % OIL [id number] Yes Apply 1-2 drops into ears once to twice daily  as needed. Willeen Niece, MD Taking Active   fluocinonide (LIDEX) 0.05 % external solution [id number] Yes Apply topically 2 (two) times daily. Apply drops to scalp for itch Willeen Niece, MD Taking Active   fluticasone [facility]) 50 MCG/ACT nasal spray [id number] Yes Place 2 sprays into both nostrils daily. Dale [place], MD Taking Active   furosemide (LASIX) 20 MG tablet [id number] Yes TAKE 1 TABLET BY MOUTH DAILY AS NEEDED Dale [place], MD Taking Active   gabapentin (NEURONTIN) 300 MG capsule [id number] Yes Take two tablets tid (in am, midday and q hs). Dale [place], MD Taking Active   HYDROcodone-acetaminophen [facility]) 10-325 MG tablet [id number] Yes Take 1 tablet by mouth 3 (three) times daily. Enedina Finner, MD Taking Active Self  Infant Care Products [facility]) [place] [id number] Yes Apply topically as directed (as discussed). Dale [place], MD Taking Active   ipratropium (ATROVENT) 0.03 % nasal spray [id number] Yes Place 2 sprays into both nostrils every 12 (twelve) hours. Dale [place], MD Taking Active Self  ketoconazole (NIZORAL) 2 % shampoo [id number] Yes Apply 1 Application topically See admin instructions. apply three times per week, massage into scalp and leave in for 10 minutes before rinsing out Willeen Niece, MD Taking Active   levalbuterol [facility]) 0.63 MG/3ML nebulizer solution [id number] Yes Take 3 mLs (0.63 mg total) by nebulization every 6 (six) hours as needed for wheezing or shortness of breath. Dale [place], MD Taking Active   LINZESS 145 MCG CAPS capsule [id number] Yes Take 145 mcg by mouth daily. [provider] Taking Active Self  magnesium oxide (MAG-OX) 400 MG tablet [id number] Yes Take 1 tablet (400 mg total) by mouth daily. Dale [place], MD Taking Active Self  metoprolol succinate (TOPROL-XL) 50 MG 24 hr tablet [id number] Yes TAKE 1 TABLET BY MOUTH DAILY. TAKE WITH OR IMMEDIATELY FOLLOWING A MEAL. Dale [place], MD Taking Active   morphine (MS CONTIN) 30 MG  12 hr tablet [id number] Yes Take 30 mg by mouth every 12 (twelve) hours. [provider] Taking Active Self  morphine (MS CONTIN) 60 MG 12 hr tablet [id number] Yes Take 1 tablet (60 mg total) by mouth 2 (two) times daily. Enedina Finner, MD Taking Active Self  mupirocin ointment (BACTROBAN) 2 % [id number] Yes Apply 1 Application topically 2 (two) times daily. Dale [place], MD Taking Active   nystatin (MYCOSTATIN/NYSTOP) powder [id number] Yes Apply 1 application. topically 2 (two) times daily as needed. Dale [place], MD Taking Active Self  nystatin cream (MYCOSTATIN) [id number] Yes Apply 1 Application topically 2 (two) times daily. Dale [place], MD Taking Active Self  polyethylene glycol powder (GLYCOLAX/MIRALAX) 17 GM/SCOOP powder [id number] Yes MIX 17 GRAMS AS MARKED ON BOTTLE TOP IN 8 OUNCES OF WATER AND DRINK ONCE A DAY AS DIRECTED. Dale [place], MD Taking Active Self  RABEprazole (ACIPHEX) 20 MG tablet [id number] Yes TAKE 1 TABLET BY MOUTH TWICE A DAY BEFORE A MEAL Dale [place], MD Taking Active   Simethicone (GAS-X PO) [id number] Yes Take 2 tablets by mouth 2 (two) times daily. [provider] Taking Active Self  sodium chloride (OCEAN) 0.65 % nasal spray [id number] Yes Place 1  spray into the nose as needed. [provider] Taking Active Self  SYNTHROID 100 MCG tablet [id number] Yes Take 1 tablet (100 mcg total) by mouth daily. Dale [place], MD Taking Active Self  triamcinolone ointment (KENALOG) 0.1 % [id number] Yes APPLY TWICE DAILY TO BITES AND RASH UNTIL FLAT AND SMOOTH **DO NOT APPLY TO FACEDale [place], MD Taking Active Self            Home Care and Equipment/Supplies: Were Home Health Services Ordered?: Yes [facility] of [facility] Health Agency:: [facility] Has Agency set up a time to come to your home?: No EMR reviewed for Home Health Orders: Orders present/patient has not received call (refer to CM for follow-up) (patient requested that [facility] come after  [id number]) Any new equipment or medical supplies ordered?: NA  Functional Questionnaire: Do you need assistance with bathing/showering or dressing?: Yes (patient has a caregiver) Do you need assistance with meal preparation?: Yes Do you need assistance with eating?: No Do you have difficulty maintaining continence: No Do you need assistance with getting out of bed/getting out of a chair/moving?: No Do you have difficulty managing or taking your medications?: No  Follow up appointments reviewed: PCP Follow-up appointment confirmed?: Yes Date of PCP follow-up appointment?: 03/11/23 Follow-up Provider: Dr Lorin Picket [facility] Follow-up appointment confirmed?: NA Do you need transportation to your follow-up appointment?: No Do you understand care options if your condition(s) worsen?: Yes-patient verbalized understanding  SDOH Interventions Today    Flowsheet Row Most Recent Value  SDOH Interventions   Food Insecurity Interventions Intervention Not Indicated  Housing Interventions Intervention Not Indicated  Transportation Interventions Intervention Not Indicated, Patient Resources (Friends/Family)      Interventions Today    Flowsheet Row Most Recent Value  General Interventions   General Interventions Discussed/Reviewed General Interventions Discussed, General Interventions Reviewed, Doctor Visits  Pharmacy Interventions   Pharmacy Dicussed/Reviewed Pharmacy Topics Reviewed, Pharmacy Topics Discussed      TOC Interventions Today    Flowsheet Row Most Recent Value  TOC Interventions   TOC Interventions Discussed/Reviewed TOC Interventions Discussed, TOC Interventions Reviewed       Gean Maidens BSN RN [facility] [phone]

## 2023-02-28 ENCOUNTER — Other Ambulatory Visit: Payer: Self-pay | Admitting: Internal Medicine

## 2023-03-10 ENCOUNTER — Telehealth: Payer: Self-pay

## 2023-03-10 ENCOUNTER — Telehealth: Payer: Self-pay | Admitting: Internal Medicine

## 2023-03-10 NOTE — Telephone Encounter (Signed)
Will discuss f/u with GI at appt tomorrow.

## 2023-03-10 NOTE — Telephone Encounter (Signed)
Hey Rasheedah!  Dr Lorin Picket ordered a swallow study on this patient that looks like it is not scheduled. Just wanted to follow up with you on this order because we are seeing her tomorrow.

## 2023-03-10 NOTE — Telephone Encounter (Signed)
Soni from [facility], [phone]. He saw patient today and patient told him she wanted to speak to Dr Lorin Picket. She wants to try out of home therapy.

## 2023-03-10 NOTE — Telephone Encounter (Signed)
Noted  

## 2023-03-10 NOTE — Telephone Encounter (Signed)
Will discuss at appt tomorrow

## 2023-03-11 ENCOUNTER — Encounter: Payer: Self-pay | Admitting: Internal Medicine

## 2023-03-11 ENCOUNTER — Ambulatory Visit (INDEPENDENT_AMBULATORY_CARE_PROVIDER_SITE_OTHER): Payer: Medicare HMO | Admitting: Internal Medicine

## 2023-03-11 VITALS — BP 126/70 | HR 72 | Temp 98.0°F | Resp 16 | Ht 62.0 in | Wt 172.0 lb

## 2023-03-11 DIAGNOSIS — J9611 Chronic respiratory failure with hypoxia: Secondary | ICD-10-CM

## 2023-03-11 DIAGNOSIS — I700 Atherosclerosis of aorta: Secondary | ICD-10-CM | POA: Diagnosis not present

## 2023-03-11 DIAGNOSIS — I5032 Chronic diastolic (congestive) heart failure: Secondary | ICD-10-CM

## 2023-03-11 DIAGNOSIS — I10 Essential (primary) hypertension: Secondary | ICD-10-CM

## 2023-03-11 DIAGNOSIS — W19XXXD Unspecified fall, subsequent encounter: Secondary | ICD-10-CM | POA: Diagnosis not present

## 2023-03-11 DIAGNOSIS — M542 Cervicalgia: Secondary | ICD-10-CM | POA: Diagnosis not present

## 2023-03-11 NOTE — Progress Notes (Signed)
Subjective:    Patient ID: Kimberly Roy, female    DOB: Jul 12, 1931, 89 y.o.   MRN: [id number]  Patient here for ER follow up.   HPI Here for ER follow up.  Was evaluated 02/22/23 - f/up fall.  Fell transitioning from bed to bedside commode. (Slid down from recliner).  Fell onto her left back/shoulder. CT scans of the head, cervical spine, and thoracic spine are all negative as are images of both shoulders. Persistent left arm pain. Elbow - abrasion.  Shoulder bruise - resolved.  Not able to get neck injection. No able to lie on stomach.  Discussed PT. Scheduled for swallowing study next week.  Breathing stable.  No increased cough or congestion.  No abdominal pain.     Past Medical History:  Diagnosis Date   Anemia    Anxiety    Chest pain    CHF (congestive heart failure) (HCC)    Constipation    DDD (degenerative disc disease), cervical    Depression    DVT (deep venous thrombosis) (HCC)    Dysphonia    Dyspnea    Fatty liver    Fatty liver    Headache    Hyperlipidemia    Hyperpiesia    Hypertension    Hypothyroidism    Interstitial cystitis    Left ventricular dysfunction    Lymphedema    Nephrolithiasis    Obstructive sleep apnea    Osteoarthritis    knees/cervical and lumbar spine   Pulmonary hypertension (HCC)    Pulmonary nodules    followed by Dr Meredeth Ide   Pure hypercholesterolemia    Renal cyst    right   Past Surgical History:  Procedure Laterality Date   ABDOMINAL HYSTERECTOMY     ovaries left in place   APPENDECTOMY     Back Surgeries     BACK SURGERY     BREAST REDUCTION SURGERY     3/99   CARDIAC CATHETERIZATION     cataracts Bilateral    CERVICAL SPINE SURGERY     ESOPHAGEAL MANOMETRY N/A 08/02/2015   Procedure: ESOPHAGEAL MANOMETRY (EM);  Surgeon: Elnita Maxwell, MD;  Location: [facility] ENDOSCOPY;  Service: Endoscopy;  Laterality: N/A;   ESOPHAGOGASTRODUODENOSCOPY N/A 02/27/2015   Procedure: ESOPHAGOGASTRODUODENOSCOPY (EGD);  Surgeon: Wallace Cullens, MD;  Location: [facility] ENDOSCOPY;  Service: Gastroenterology;  Laterality: N/A;   ESOPHAGOGASTRODUODENOSCOPY (EGD) WITH PROPOFOL N/A 10/30/2018   Procedure: ESOPHAGOGASTRODUODENOSCOPY (EGD) WITH PROPOFOL;  Surgeon: Christena Deem, MD;  Location: [facility] ENDOSCOPY;  Service: Endoscopy;  Laterality: N/A;   EXCISIONAL HEMORRHOIDECTOMY     EYE SURGERY     FRACTURE SURGERY     HEMORRHOID SURGERY     HIP SURGERY  2013   Right hip surgery   JOINT REPLACEMENT     KNEE ARTHROSCOPY     left and right   ORIF FEMUR FRACTURE Right 08/07/2018   Procedure: OPEN REDUCTION INTERNAL FIXATION (ORIF) DISTAL FEMUR FRACTURE;  Surgeon: Kennedy Bucker, MD;  Location: [facility] ORS;  Service: Orthopedics;  Laterality: Right;   REDUCTION MAMMAPLASTY Bilateral YRS AGO   REPLACEMENT TOTAL KNEE Bilateral    rotator cuff surgery     blilateral   TONSILECTOMY/ADENOIDECTOMY WITH MYRINGOTOMY     VISCERAL ARTERY INTERVENTION N/A 02/25/2019   Procedure: VISCERAL ARTERY INTERVENTION;  Surgeon: Annice Needy, MD;  Location: [facility] INVASIVE CV LAB;  Service: Cardiovascular;  Laterality: N/A;   Family History  Problem Relation Age of Onset   Heart disease Mother  Stroke Mother    Hypertension Mother    Heart disease Father        myocardial infarction age 73   Breast cancer Neg Hx    Social History   Socioeconomic History   Marital status: Widowed    Spouse name: Not on file   Number of children: 2   Years of education: 63   Highest education level: Associate degree: occupational, Scientist, product/process development, or vocational program  Occupational History    Comment: Engineer, site  Tobacco Use   Smoking status: Never   Smokeless tobacco: Never  Vaping Use   Vaping status: Never Used  Substance and Sexual Activity   Alcohol use: No    Alcohol/week: 0.0 standard drinks of alcohol   Drug use: Never   Sexual activity: Not Currently  Other Topics Concern   Not on file  Social History Narrative   Not on file   Social Determinants  of Health   Financial Resource Strain: Low Risk  (02/13/2023)   Overall Financial Resource Strain (CARDIA)    Difficulty of Paying Living Expenses: Not very hard  Food Insecurity: No Food Insecurity (02/26/2023)   Hunger Vital Sign    Worried About Running Out of Food in the Last Year: Never true    Ran Out of Food in the Last Year: Never true  Transportation Needs: No Transportation Needs (02/26/2023)   PRAPARE - Administrator, Civil Service (Medical): No    Lack of Transportation (Non-Medical): No  Physical Activity: Inactive (02/13/2023)   Exercise Vital Sign    Days of Exercise per Week: 0 days    Minutes of Exercise per Session: 10 min  Stress: No Stress Concern Present (02/13/2023)   [employer] of Occupational Health - Occupational Stress Questionnaire    Feeling of Stress : Only a little  Social Connections: Moderately Isolated (02/13/2023)   Social Connection and Isolation Panel [NHANES]    Frequency of Communication with Friends and Family: More than three times a week    Frequency of Social Gatherings with Friends and Family: Once a week    Attends Religious Services: 1 to 4 times per year    Active Member of [employer] or Organizations: No    Attends Banker Meetings: Never    Marital Status: Widowed     Review of Systems  Constitutional:  Negative for appetite change and unexpected weight change.  HENT:  Negative for congestion and sinus pressure.   Respiratory:  Negative for cough, chest tightness and shortness of breath.   Cardiovascular:  Negative for chest pain and palpitations.  Gastrointestinal:  Negative for abdominal pain, diarrhea, nausea and vomiting.  Genitourinary:  Negative for difficulty urinating and dysuria.  Musculoskeletal:  Positive for neck pain.       Arm pain as outlined.   Skin:  Negative for color change and rash.  Neurological:  Negative for dizziness and headaches.  Psychiatric/Behavioral:  Negative for agitation and  dysphoric mood.        Objective:     BP 126/70   Pulse 72   Temp 98 F (36.7 C)   Resp 16   Ht 5\' 2"  (1.575 m)   Wt 172 lb (78 kg)   SpO2 97%   BMI 31.46 kg/m  Wt Readings from Last 3 Encounters:  03/11/23 172 lb (78 kg)  02/22/23 172 lb (78 kg)  02/13/23 172 lb (78 kg)    Physical Exam Vitals reviewed.  Constitutional:  General: She is not in acute distress.    Appearance: Normal appearance.  HENT:     Head: Normocephalic and atraumatic.     Right Ear: External ear normal.     Left Ear: External ear normal.  Eyes:     General: No scleral icterus.       Right eye: No discharge.        Left eye: No discharge.     Conjunctiva/sclera: Conjunctivae normal.  Neck:     Thyroid: No thyromegaly.  Cardiovascular:     Rate and Rhythm: Normal rate and regular rhythm.  Pulmonary:     Effort: No respiratory distress.     Breath sounds: Normal breath sounds. No wheezing.  Abdominal:     General: Bowel sounds are normal.     Palpations: Abdomen is soft.     Tenderness: There is no abdominal tenderness.  Musculoskeletal:     Cervical back: Neck supple. No tenderness.     Comments: Limited rom - left arm.  Increased pain - with attempts at full extension of left arm.   Lymphadenopathy:     Cervical: No cervical adenopathy.  Skin:    Findings: No erythema or rash.  Neurological:     Mental Status: She is alert.  Psychiatric:        Mood and Affect: Mood normal.        Behavior: Behavior normal.      Outpatient Encounter Medications as of 03/11/2023  Medication Sig   albuterol (VENTOLIN HFA) 108 (90 Base) MCG/ACT inhaler INHALE 2 PUFFS INTO THE LUNGS EVERY 6 HOURS AS NEEDED FOR WHEEZING OR SHORTNESS OF BREATH   aluminum-magnesium hydroxide-simethicone (MAALOX) 200-200-20 MG/5ML SUSP Take 15 mLs by mouth 4 (four) times daily -  before meals and at bedtime.    amLODipine (NORVASC) 10 MG tablet TAKE 1 TABLET BY MOUTH DAILY   aspirin 81 MG EC tablet Take 81 mg by  mouth daily. Every other day   atorvastatin (LIPITOR) 20 MG tablet TAKE 1 TABLET BY MOUTH AT BEDTIME   benazepril (LOTENSIN) 40 MG tablet TAKE 1 TABLET BY MOUTH DAILY   betamethasone dipropionate 0.05 % cream Apply 1 application topically 2 (two) times daily.   budesonide-formoterol (SYMBICORT) 80-4.5 MCG/ACT inhaler Inhale 2 puffs into the lungs in the morning and at bedtime.   busPIRone (BUSPAR) 10 MG tablet Take 10 mg by mouth 3 (three) times daily.   calcium citrate-vitamin D (CITRACAL+D) 315-200 MG-UNIT tablet Take 2 tablets by mouth daily.   Cholecalciferol (VITAMIN D3) 1000 UNITS CAPS Take 1 capsule by mouth daily.   clobetasol cream (TEMOVATE) 0.05 % Apply 1 application topically See admin instructions. Apply to affected areas of body 1 - 2 times daily as needed for itchy bumps. Avoid applying to face, groin, and axilla. Use as directed.   cyanocobalamin (VITAMIN B12) 1000 MCG/ML injection Inject 1 mL (1,000 mcg total) into the muscle every 3 (three) months.   Diclofenac Sodium 3 % GEL Apply topically 2 (two) times daily.   Docusate Sodium (DSS) 100 MG CAPS Take 100 mg by mouth 2 (two) times daily.   DULoxetine (CYMBALTA) 60 MG capsule Take 60 mg by mouth daily.   Fluocinolone Acetonide 0.01 % OIL Apply 1-2 drops into ears once to twice daily as needed.   fluocinonide (LIDEX) 0.05 % external solution Apply topically 2 (two) times daily. Apply drops to scalp for itch   fluticasone (FLONASE) 50 MCG/ACT nasal spray Place 2 sprays into both nostrils  daily.   gabapentin (NEURONTIN) 300 MG capsule Take two tablets tid (in am, midday and q hs).   HYDROcodone-acetaminophen (NORCO) 10-325 MG tablet Take 1 tablet by mouth 3 (three) times daily.   Infant Care Products [facility]) OINT Apply topically as directed (as discussed).   ipratropium (ATROVENT) 0.03 % nasal spray Place 2 sprays into both nostrils every 12 (twelve) hours.   ketoconazole (NIZORAL) 2 % shampoo Apply 1 Application topically See  admin instructions. apply three times per week, massage into scalp and leave in for 10 minutes before rinsing out   levalbuterol (XOPENEX) 0.63 MG/3ML nebulizer solution Take 3 mLs (0.63 mg total) by nebulization every 6 (six) hours as needed for wheezing or shortness of breath.   LINZESS 145 MCG CAPS capsule Take 145 mcg by mouth daily.   magnesium oxide (MAG-OX) 400 MG tablet Take 1 tablet (400 mg total) by mouth daily.   metoprolol succinate (TOPROL-XL) 50 MG 24 hr tablet TAKE 1 TABLET BY MOUTH DAILY. TAKE WITH OR IMMEDIATELY FOLLOWING A MEAL.   morphine (MS CONTIN) 30 MG 12 hr tablet Take 30 mg by mouth every 12 (twelve) hours.   morphine (MS CONTIN) 60 MG 12 hr tablet Take 1 tablet (60 mg total) by mouth 2 (two) times daily.   mupirocin ointment (BACTROBAN) 2 % Apply 1 Application topically 2 (two) times daily.   nystatin (MYCOSTATIN/NYSTOP) powder Apply 1 application. topically 2 (two) times daily as needed.   nystatin cream (MYCOSTATIN) Apply 1 Application topically 2 (two) times daily.   polyethylene glycol powder (GLYCOLAX/MIRALAX) 17 GM/SCOOP powder MIX 17 GRAMS AS MARKED ON BOTTLE TOP IN 8 OUNCES OF WATER AND DRINK ONCE A DAY AS DIRECTED.   RABEprazole (ACIPHEX) 20 MG tablet TAKE 1 TABLET BY MOUTH TWICE A DAY BEFORE A MEAL   Simethicone (GAS-X PO) Take 2 tablets by mouth 2 (two) times daily.   sodium chloride (OCEAN) 0.65 % nasal spray Place 1 spray into the nose as needed.   SYNTHROID 100 MCG tablet TAKE 1 TABLET BY MOUTH DAILY   triamcinolone ointment (KENALOG) 0.1 % APPLY TWICE DAILY TO BITES AND RASH UNTIL FLAT AND SMOOTH **DO NOT APPLY TO FACE**   [DISCONTINUED] furosemide (LASIX) 20 MG tablet TAKE 1 TABLET BY MOUTH DAILY AS NEEDED   No facility-administered encounter medications on file as of 03/11/2023.     Lab Results  Component Value Date   WBC 5.6 02/12/2023   HGB 10.5 (L) 02/12/2023   HCT 32.0 (L) 02/12/2023   PLT 133 (L) 02/12/2023   GLUCOSE 84 02/12/2023   CHOL 143  01/21/2022   TRIG 108.0 01/21/2022   HDL 57.90 01/21/2022   LDLCALC 63 01/21/2022   ALT 15 02/12/2023   AST 21 02/12/2023   NA 142 02/12/2023   K 4.1 02/12/2023   CL 100 02/12/2023   CREATININE 0.81 02/12/2023   BUN 23 02/12/2023   CO2 30 02/12/2023   TSH 4.63 01/21/2022   INR 0.89 08/06/2018   HGBA1C 6.0 10/19/2014    CT Thoracic Spine Wo Contrast  Result Date: 02/22/2023 CLINICAL DATA:  Back trauma, no prior imaging (Age >= 16y) EXAM: CT THORACIC SPINE WITHOUT CONTRAST TECHNIQUE: Multidetector CT images of the thoracic were obtained using the standard protocol without intravenous contrast. RADIATION DOSE REDUCTION: This exam was performed according to the departmental dose-optimization program which includes automated exposure control, adjustment of the mA and/or kV according to patient size and/or use of iterative reconstruction technique. COMPARISON:  CT Chest 12/29/19,  07/07/22 FINDINGS: Alignment: Exaggerated thoracic kyphosis. Vertebrae: No acute fracture or focal pathologic process. There is osseous fusion at the L1-L2 vertebral bodies and the posterior elements of L1-L3 vertebral bodies on the left. Paraspinal and other soft tissues: Spinal nerve stimulator leads in place which enters the spinal canal at the T8-T9 interspace with the leads positioned at the T7 level. There is a subcutaneous hyperdense lesion along the upper back (series 7, image 52) with a possible skin defect, unchanged compared to 07/07/22 CT chest. Correlate with physical exam. The gallbladder is distended. No evidence of pericholecystic fluid. There is a mild intrahepatic biliary ductal dilatation. These findings are likely unchanged compared to recent prior CT abdomen and pelvis dated 10/16/2022. Moderate coronary artery calcifications. Calcifications of the thoracic aorta. Of air there are geographic regions trapping in the bilateral upper lobes, nonspecific could be seen in the setting of bronchitis. There is minimal  interval increase in size of a right upper lobe pulmonary nodule which now measures up to 6 x 5 mm, unchanged compared to 07/07/22 Disc levels: No CT evidence of high-grade spinal canal stenosis. IMPRESSION: 1. No acute fracture or traumatic listhesis of the thoracic spine. Note that assessment is slightly limited to degree osteopenia, particularly in the visualized lumbar spine. 2. Redemonstrated 6 mm pulmonary nodule in the right upper lobe, unchanged compared to 07/07/22. 3. Distended gallbladder with mild intrahepatic biliary ductal dilatation. These findings are likely unchanged compared to recent prior CT abdomen and pelvis dated 10/16/2022. Aortic Atherosclerosis (ICD10-I70.0). Electronically Signed   By: Lorenza Cambridge M.D.   On: 02/22/2023 11:13   CT Head Wo Contrast  Result Date: 02/22/2023 CLINICAL DATA:  Head trauma.  Status post fall. EXAM: CT HEAD WITHOUT CONTRAST TECHNIQUE: Contiguous axial images were obtained from the base of the skull through the vertex without intravenous contrast. RADIATION DOSE REDUCTION: This exam was performed according to the departmental dose-optimization program which includes automated exposure control, adjustment of the mA and/or kV according to patient size and/or use of iterative reconstruction technique. COMPARISON:  07/11/2022 FINDINGS: Brain: No evidence of acute infarction, hemorrhage, hydrocephalus, extra-axial collection or mass lesion/mass effect. There is mild diffuse low-attenuation within the subcortical and periventricular white matter compatible with chronic microvascular disease. Vascular: No hyperdense vessel or unexpected calcification. Skull: Normal. Negative for fracture or focal lesion. Sinuses/Orbits: There is chronic appearing opacification of the sphenoid sinus. Partially calcified and septated mucus noted. No acute abnormality. Other: None. IMPRESSION: 1. No acute intracranial abnormalities. 2. Chronic microvascular disease and brain atrophy. 3.  Chronic sphenoid sinus disease. Electronically Signed   By: Signa Kell M.D.   On: 02/22/2023 10:43   CT Cervical Spine Wo Contrast  Result Date: 02/22/2023 CLINICAL DATA:  Neck trauma (Age >= 65y) EXAM: CT CERVICAL SPINE WITHOUT CONTRAST TECHNIQUE: Multidetector CT imaging of the cervical spine was performed without intravenous contrast. Multiplanar CT image reconstructions were also generated. RADIATION DOSE REDUCTION: This exam was performed according to the departmental dose-optimization program which includes automated exposure control, adjustment of the mA and/or kV according to patient size and/or use of iterative reconstruction technique. COMPARISON:  08/18/2015 FINDINGS: Alignment: Facet joints are aligned without dislocation or traumatic listhesis. Dens and lateral masses are aligned. New grade 1 anterolisthesis of C3 on C4 secondary to progressive degenerative facet arthropathy. Skull base and vertebrae: No acute fracture. No primary bone lesion or focal pathologic process. Congenital nonunion of the posterior arch of C1. Soft tissues and spinal canal: No prevertebral fluid or swelling.  No visible canal hematoma. Disc levels: Advanced multilevel degenerative disc disease and facet arthropathy throughout the cervical spine, progressed since 2016. Prominent degenerative pannus formation posterior to the odontoid process resulting in canal narrowing at the C1-C2 level. Multiple additional levels of foraminal and canal stenosis, not well assessed by CT. Upper chest: Mosaic attenuation of the included lung apices. Other: Bilateral carotid atherosclerosis. IMPRESSION: 1. No acute fracture or traumatic listhesis of the cervical spine. 2. Advanced multilevel degenerative disc disease and facet arthropathy throughout the cervical spine, progressed since 2016. 3. Mosaic attenuation of the included lung apices, which can be seen in the setting of small airways disease. Electronically Signed   By: Duanne Guess D.O.   On: 02/22/2023 10:32   DG Shoulder Right  Result Date: 02/22/2023 CLINICAL DATA:  Fall, pain. EXAM: RIGHT SHOULDER - 2+ VIEW COMPARISON:  07/18/2017 FINDINGS: There is no evidence of fracture or dislocation. Glenohumeral and acromioclavicular osteophyte formation and joint space narrowing consistent with osteoarthritis. IMPRESSION: Degenerative changes.  No acute osseous abnormalities. Electronically Signed   By: Layla Maw M.D.   On: 02/22/2023 09:57   DG Shoulder Left  Result Date: 02/22/2023 CLINICAL DATA:  Trauma, pain. EXAM: LEFT SHOULDER - 3 VIEW COMPARISON:  None Available. FINDINGS: There is no evidence of fracture or dislocation. Osteophyte formation and joint space narrowing consistent with glenohumeral osteoarthritis. Widening of the [facility] joint. This may be related to previous acromioplasty and correlation with surgical history recommended. IMPRESSION: Degenerative changes.  No acute traumatic abnormalities. Electronically Signed   By: Layla Maw M.D.   On: 02/22/2023 09:48       Assessment & Plan:  Neck pain Assessment & Plan: Neck and shoulder pain as outlined.  Has seen ortho.  Some limitations - using her walker, ADLs, etc.  Discussed PT - for evaluation and treatment neck and core muscles. Prefers outpatient PT.   Orders: -     Ambulatory referral to Physical Therapy  Primary hypertension Assessment & Plan: Blood pressure as outlined.  Continue benazepril, amlodipine, lasix and metoprolol.  Follow pressures.  Follow metabolic panel.    Chronic respiratory failure with hypoxia (HCC) Assessment & Plan: Uses oxygen at night.  Continue oxygen - nocturnal.       Chronic diastolic CHF (congestive heart failure) (HCC) Assessment & Plan: Euvolemic clinically.  Continue benazepril, metoprolol and lasix.    Aortic atherosclerosis (HCC) Assessment & Plan: Continue lipitor.    Fall, subsequent encounter Assessment & Plan: S/p fall. ER evaluation  as outlined.  CT scans as outlined. With increased arm pain and limited rom.  Discussed ortho f/u and PT evaluation.       Dale [place], MD

## 2023-03-14 ENCOUNTER — Other Ambulatory Visit: Payer: Self-pay | Admitting: Internal Medicine

## 2023-03-14 ENCOUNTER — Ambulatory Visit: Payer: Medicare HMO

## 2023-03-15 ENCOUNTER — Encounter: Payer: Self-pay | Admitting: Internal Medicine

## 2023-03-15 DIAGNOSIS — W19XXXA Unspecified fall, initial encounter: Secondary | ICD-10-CM | POA: Insufficient documentation

## 2023-03-15 NOTE — Assessment & Plan Note (Signed)
Uses oxygen at night.  Continue oxygen - nocturnal.     

## 2023-03-15 NOTE — Assessment & Plan Note (Signed)
S/p fall. ER evaluation as outlined.  CT scans as outlined. With increased arm pain and limited rom.  Discussed ortho f/u and PT evaluation.

## 2023-03-15 NOTE — Assessment & Plan Note (Signed)
Neck and shoulder pain as outlined.  Has seen ortho.  Some limitations - using her walker, ADLs, etc.  Discussed PT - for evaluation and treatment neck and core muscles. Prefers outpatient PT.

## 2023-03-15 NOTE — Assessment & Plan Note (Signed)
Euvolemic clinically.  Continue benazepril, metoprolol and lasix.  

## 2023-03-15 NOTE — Assessment & Plan Note (Signed)
Continue lipitor  ?

## 2023-03-15 NOTE — Assessment & Plan Note (Signed)
Blood pressure as outlined.  Continue benazepril, amlodipine, lasix and metoprolol.  Follow pressures.  Follow metabolic panel.  

## 2023-03-17 ENCOUNTER — Ambulatory Visit: Payer: Medicare HMO

## 2023-03-24 DIAGNOSIS — F330 Major depressive disorder, recurrent, mild: Secondary | ICD-10-CM | POA: Diagnosis not present

## 2023-03-24 DIAGNOSIS — F5105 Insomnia due to other mental disorder: Secondary | ICD-10-CM | POA: Diagnosis not present

## 2023-03-24 DIAGNOSIS — F419 Anxiety disorder, unspecified: Secondary | ICD-10-CM | POA: Diagnosis not present

## 2023-03-26 ENCOUNTER — Ambulatory Visit: Payer: Medicare HMO

## 2023-03-27 ENCOUNTER — Ambulatory Visit
Admission: RE | Admit: 2023-03-27 | Discharge: 2023-03-27 | Disposition: A | Discharge status: Home / Self Care | Payer: Medicare HMO | Source: Ambulatory Visit | Attending: Internal Medicine | Admitting: Internal Medicine

## 2023-03-27 DIAGNOSIS — Z1231 Encounter for screening mammogram for malignant neoplasm of breast: Secondary | ICD-10-CM | POA: Insufficient documentation

## 2023-03-31 ENCOUNTER — Other Ambulatory Visit: Payer: Self-pay | Admitting: Internal Medicine

## 2023-04-02 ENCOUNTER — Ambulatory Visit
Admission: RE | Admit: 2023-04-02 | Discharge: 2023-04-02 | Disposition: A | Discharge status: Home / Self Care | Payer: Medicare HMO | Source: Ambulatory Visit | Attending: Internal Medicine | Admitting: Internal Medicine

## 2023-04-07 DIAGNOSIS — E7800 Pure hypercholesterolemia, unspecified: Secondary | ICD-10-CM | POA: Diagnosis not present

## 2023-04-07 DIAGNOSIS — I872 Venous insufficiency (chronic) (peripheral): Secondary | ICD-10-CM | POA: Diagnosis not present

## 2023-04-07 DIAGNOSIS — I350 Nonrheumatic aortic (valve) stenosis: Secondary | ICD-10-CM | POA: Diagnosis not present

## 2023-04-07 DIAGNOSIS — R001 Bradycardia, unspecified: Secondary | ICD-10-CM | POA: Diagnosis not present

## 2023-04-07 DIAGNOSIS — I272 Pulmonary hypertension, unspecified: Secondary | ICD-10-CM | POA: Diagnosis not present

## 2023-04-07 DIAGNOSIS — I10 Essential (primary) hypertension: Secondary | ICD-10-CM | POA: Diagnosis not present

## 2023-04-07 DIAGNOSIS — I340 Nonrheumatic mitral (valve) insufficiency: Secondary | ICD-10-CM | POA: Diagnosis not present

## 2023-04-07 DIAGNOSIS — Z9889 Other specified postprocedural states: Secondary | ICD-10-CM | POA: Diagnosis not present

## 2023-04-14 ENCOUNTER — Ambulatory Visit: Payer: Medicare HMO

## 2023-04-17 ENCOUNTER — Encounter (INDEPENDENT_AMBULATORY_CARE_PROVIDER_SITE_OTHER): Payer: Self-pay

## 2023-04-18 ENCOUNTER — Telehealth: Payer: Self-pay

## 2023-04-18 NOTE — Telephone Encounter (Signed)
Patient states she was scheduled for a swallowing test in July and she did not get to do it because of transportation and some other things.  Patient states she would like for Dr. Dale Seymour to send the order in again for her to have the swallowing test.

## 2023-04-22 ENCOUNTER — Ambulatory Visit: Payer: Medicare HMO | Attending: Internal Medicine

## 2023-04-22 DIAGNOSIS — R293 Abnormal posture: Secondary | ICD-10-CM | POA: Insufficient documentation

## 2023-04-22 DIAGNOSIS — M6281 Muscle weakness (generalized): Secondary | ICD-10-CM | POA: Diagnosis not present

## 2023-04-22 DIAGNOSIS — M542 Cervicalgia: Secondary | ICD-10-CM | POA: Insufficient documentation

## 2023-04-22 NOTE — Therapy (Signed)
OUTPATIENT PHYSICAL THERAPY CERVICAL EVALUATION   Patient Name: Kimberly Roy MRN: [id number] DOB:[birth year], 89 y.o., female Today's Date: 04/22/2023  END OF SESSION:  PT End of Session - 04/22/23 1523     Visit Number 1    Number of Visits 13    Date for PT Re-Evaluation 06/03/23    PT Start Time 1432    PT Stop Time 1515    PT Time Calculation (min) 43 min    Activity Tolerance Patient tolerated treatment well    Behavior During Therapy WFL for tasks assessed/performed             Past Medical History:  Diagnosis Date   Anemia    Anxiety    Chest pain    CHF (congestive heart failure) (HCC)    Constipation    DDD (degenerative disc disease), cervical    Depression    DVT (deep venous thrombosis) (HCC)    Dysphonia    Dyspnea    Fatty liver    Fatty liver    Headache    Hyperlipidemia    Hyperpiesia    Hypertension    Hypothyroidism    Interstitial cystitis    Left ventricular dysfunction    Lymphedema    Nephrolithiasis    Obstructive sleep apnea    Osteoarthritis    knees/cervical and lumbar spine   Pulmonary hypertension (HCC)    Pulmonary nodules    followed by Dr Meredeth Ide   Pure hypercholesterolemia    Renal cyst    right   Past Surgical History:  Procedure Laterality Date   ABDOMINAL HYSTERECTOMY     ovaries left in place   APPENDECTOMY     Back Surgeries     BACK SURGERY     BREAST REDUCTION SURGERY     3/99   CARDIAC CATHETERIZATION     cataracts Bilateral    CERVICAL SPINE SURGERY     ESOPHAGEAL MANOMETRY N/A 08/02/2015   Procedure: ESOPHAGEAL MANOMETRY (EM);  Surgeon: Elnita Maxwell, MD;  Location: [facility] ENDOSCOPY;  Service: Endoscopy;  Laterality: N/A;   ESOPHAGOGASTRODUODENOSCOPY N/A 02/27/2015   Procedure: ESOPHAGOGASTRODUODENOSCOPY (EGD);  Surgeon: Wallace Cullens, MD;  Location: [facility] ENDOSCOPY;  Service: Gastroenterology;  Laterality: N/A;   ESOPHAGOGASTRODUODENOSCOPY (EGD) WITH PROPOFOL N/A 10/30/2018   Procedure:  ESOPHAGOGASTRODUODENOSCOPY (EGD) WITH PROPOFOL;  Surgeon: Christena Deem, MD;  Location: [facility] ENDOSCOPY;  Service: Endoscopy;  Laterality: N/A;   EXCISIONAL HEMORRHOIDECTOMY     EYE SURGERY     FRACTURE SURGERY     HEMORRHOID SURGERY     HIP SURGERY  2013   Right hip surgery   JOINT REPLACEMENT     KNEE ARTHROSCOPY     left and right   ORIF FEMUR FRACTURE Right 08/07/2018   Procedure: OPEN REDUCTION INTERNAL FIXATION (ORIF) DISTAL FEMUR FRACTURE;  Surgeon: Kennedy Bucker, MD;  Location: [facility] ORS;  Service: Orthopedics;  Laterality: Right;   REDUCTION MAMMAPLASTY Bilateral YRS AGO   REPLACEMENT TOTAL KNEE Bilateral    rotator cuff surgery     blilateral   TONSILECTOMY/ADENOIDECTOMY WITH MYRINGOTOMY     VISCERAL ARTERY INTERVENTION N/A 02/25/2019   Procedure: VISCERAL ARTERY INTERVENTION;  Surgeon: Annice Needy, MD;  Location: [facility] INVASIVE CV LAB;  Service: Cardiovascular;  Laterality: N/A;   Patient Active Problem List   Diagnosis Date Noted   Fall 03/15/2023   Difficulty swallowing 02/13/2023   Bartholin cyst 02/11/2023   Abnormal CT of the abdomen 10/27/2022   Trochanteric bursitis, right  hip 10/10/2022   Earlobe lesion, right 09/12/2022   Pneumonia 07/12/2022   Hypertensive urgency 07/12/2022   Aortic stenosis 07/12/2022   Soft tissue injury 07/12/2022   Anemia 04/13/2022   B12 deficiency 04/04/2022   Interstitial cystitis 03/28/2022   Iron deficiency 02/28/2022   Right foot pain 01/29/2022   SOB (shortness of breath) 10/14/2021   Rash 03/03/2021   Skin lesion 03/03/2021   Aortic atherosclerosis (HCC) 11/20/2020   Bradycardia 06/08/2020   Itching 04/08/2020   Dyspnea 01/17/2020   Chronic respiratory failure with hypoxia (HCC) 01/17/2020   Right hip pain 01/10/2020   Leukocytosis 11/15/2019   Wound of buttock 10/25/2019   COVID-19 virus infection 10/04/2019   Urinary frequency 09/26/2019   Cold feeling 09/12/2019   Knee pain 07/04/2019   Hemoptysis 06/30/2019    Chronic diastolic CHF (congestive heart failure) (HCC) 08/10/2018   Femur fracture (HCC) 08/06/2018   Primary osteoarthritis of right shoulder 08/15/2017   Right shoulder pain 05/16/2017   Chronic venous insufficiency 02/10/2017   Lymphedema 02/10/2017   Neuropathy 12/25/2016   Anemia due to blood loss, chronic 08/07/2016   GERD (gastroesophageal reflux disease) 12/10/2015   Finger pain 11/13/2015   Carotid artery calcification 09/26/2015   External nasal lesion 09/25/2015   Muscle cramps 09/01/2015   Excessive sweating 07/30/2015   Headache 07/16/2015   Groin pain 05/14/2015   Muscle twitching 03/05/2015   Leg cramps 02/11/2015   Back pain 02/11/2015   Abdominal pain 02/11/2015   Chronic pain 01/19/2015   Acute cystitis without hematuria 12/12/2014   Left elbow pain 11/30/2014   Health care maintenance 11/30/2014   Osteoporosis 10/19/2014   Rectal bleeding 10/19/2014   Neck pain 09/03/2014   Unsteady gait 09/03/2014   Nocturia 09/03/2014   Dysphagia 06/01/2014   Nasal dryness 06/01/2014   Stress 06/01/2014   Fatigue 02/27/2014   Degenerative disc disease 12/21/2013   Pre-op evaluation 10/12/2013   Hoarseness 08/14/2013   Leg swelling 01/24/2013   CHF (congestive heart failure) (HCC) 12/29/2012   Cough 12/29/2012   OSA (obstructive sleep apnea) 07/09/2012   Osteoarthritis 07/04/2012   Symptomatic anemia 07/04/2012   Chronic constipation 07/04/2012   Pulmonary hypertension (HCC) 07/04/2012   Pulmonary nodules 07/04/2012   Hypertension 07/04/2012   Hyperlipidemia 07/04/2012   Hypothyroidism 07/04/2012    PCP: Dale Shellman, MD  REFERRING PROVIDER: Dale [place], MD  REFERRING DIAG: M54.2 (ICD-10-CM) - Neck pain  THERAPY DIAG:  Cervicalgia  Muscle weakness (generalized)  Abnormal posture  Rationale for Evaluation and Treatment: Rehabilitation  ONSET DATE: ~9 months ago  SUBJECTIVE:  SUBJECTIVE STATEMENT: Pt is a 89 y.o. female referred to OPPT for neck pain.   Hand dominance: Right  PERTINENT HISTORY:  Pt reports having chronic cervical pain for many years. History of cervical surgery in her 30's per pt reports. Pain has worsened gradually over the last several months. Referred pain down her LUE at mid deltoid. Reports pain as dull and achey. Reports having trouble using her LUE on her rollator in weightbearing and L shoulder pain with overhead activities. Denies N/T into her LUE or L hand weakness. Pt does have very kyphotic posture and cervical flexion with difficulty holding her head in a neutral position during subjective reports. Worst pain up to 9/10 NPS, best pain 4-5/10 NPS. Pain currently 6/10 NPS which she reports is her average.   PAIN:  Are you having pain? Yes: NPRS scale: 6/10 Pain location: L upper trap to top of L shoulder  Pain description: Dull and achey Aggravating factors: WB on rollator, overhead use with LUE.  Relieving factors: medications  PRECAUTIONS: None  RED FLAGS: Bowel or bladder incontinence: No, Spinal tumors: No, and Cauda equina syndrome: No     WEIGHT BEARING RESTRICTIONS: No  FALLS:  Has patient fallen in last 6 months? No  LIVING ENVIRONMENT: Lives with: lives with their son Lives in: House/apartment Has following equipment at home: Single point cane, Environmental consultant - 4 wheeled, Tour manager, and bed side commode  OCCUPATION: Retired  PLOF: Independent with transfers, Requires assistive device for independence, Needs assistance with ADLs, and Needs assistance with homemaking  PATIENT GOALS: Improve her pain   NEXT MD VISIT: September 2024  OBJECTIVE:   DIAGNOSTIC FINDINGS:  CLINICAL DATA:  Neck trauma (Age >= 65y)   EXAM: CT CERVICAL SPINE WITHOUT CONTRAST   TECHNIQUE: Multidetector CT  imaging of the cervical spine was performed without intravenous contrast. Multiplanar CT image reconstructions were also generated.   RADIATION DOSE REDUCTION: This exam was performed according to the departmental dose-optimization program which includes automated exposure control, adjustment of the mA and/or kV according to patient size and/or use of iterative reconstruction technique.   COMPARISON:  08/18/2015   FINDINGS: Alignment: Facet joints are aligned without dislocation or traumatic listhesis. Dens and lateral masses are aligned. New grade 1 anterolisthesis of C3 on C4 secondary to progressive degenerative facet arthropathy.   Skull base and vertebrae: No acute fracture. No primary bone lesion or focal pathologic process. Congenital nonunion of the posterior arch of C1.   Soft tissues and spinal canal: No prevertebral fluid or swelling. No visible canal hematoma.   Disc levels: Advanced multilevel degenerative disc disease and facet arthropathy throughout the cervical spine, progressed since 2016. Prominent degenerative pannus formation posterior to the odontoid process resulting in canal narrowing at the C1-C2 level. Multiple additional levels of foraminal and canal stenosis, not well assessed by CT.   Upper chest: Mosaic attenuation of the included lung apices.   Other: Bilateral carotid atherosclerosis.   IMPRESSION: 1. No acute fracture or traumatic listhesis of the cervical spine. 2. Advanced multilevel degenerative disc disease and facet arthropathy throughout the cervical spine, progressed since 2016. 3. Mosaic attenuation of the included lung apices, which can be seen in the setting of small airways disease.     Electronically Signed   By: Duanne Guess D.O.   On: 02/22/2023 10:32  PATIENT SURVEYS:  FOTO 40/48  COGNITION: Overall cognitive status: Within functional limits for tasks assessed  SENSATION: Light touch: WFL  POSTURE: rounded  shoulders, forward head,  decreased lumbar lordosis, increased thoracic kyphosis, and flexed trunk   PALPATION: TTP along L cervical paraspinals and upper trap. Painful to palpate at L biceps tendon.TTP also on bilat pec major.   CERVICAL ROM:   Active ROM A/PROM (deg) eval  Flexion 60  Extension 20*  Right lateral flexion Unable  Left lateral flexion Unable  Right rotation 40  Left rotation 42   (Blank rows = not tested)  UPPER EXTREMITY ROM:  Active ROM Right eval Left eval  Shoulder flexion 124 122*  Shoulder extension    Shoulder abduction    Shoulder adduction    Shoulder extension    Shoulder internal rotation    Shoulder external rotation    Elbow flexion WNL WNL  Elbow extension WNL WNL  Wrist flexion    Wrist extension    Wrist ulnar deviation    Wrist radial deviation    Wrist pronation    Wrist supination     (Blank rows = not tested)  Shoulder ER, flexion, abduction to occiput  Shoulder extension, IR, adduction to T8/bra strap. Painful on L shoulder.   UPPER EXTREMITY MMT:  MMT Right eval Left eval  Shoulder flexion 4* 4*  Shoulder extension    Shoulder abduction 5* 5*  Shoulder adduction    Shoulder extension    Shoulder internal rotation    Shoulder external rotation    Middle trapezius    Lower trapezius    Elbow flexion 4 4*  Elbow extension 5 5  Wrist flexion 5 5  Wrist extension 5 5  Wrist ulnar deviation    Wrist radial deviation    Wrist pronation    Wrist supination    Grip strength Symmetrical Symmetrical   (Blank rows = not tested)  CERVICAL/SHOULDER SPECIAL TESTS:  Open can: positive on LUE     FUNCTIONAL TESTS:  Pt with significant cervical flexor contracture with attempts to lie supine. Unable to tolerate even inclined due to pt quickly reporting dizziness. Thus unable to assess further shoulder joint mobility, PROM  TODAY'S TREATMENT:                                                                                                                               DATE: 04/22/23  Reviewed with pt and caregiver on functional cervical extension when ambulating/having conversation to address cervical extension AROM with removal of cervical brace to begin working on cervical extension. Provided hand out for reps/sets/frequency for postural strengthening   PATIENT EDUCATION:  Education details: Prognosis, HEP, POC Person educated: Patient, Child(ren), and Caregiver   Education method: Explanation, Demonstration, Tactile cues, Verbal cues, and Handouts Education comprehension: verbalized understanding, returned demonstration, verbal cues required, tactile cues required, and needs further education  HOME EXERCISE PROGRAM: Access Code: [email or web address] URL: [email or web address] Date: 04/22/2023 Prepared by: Ronnie Derby  Exercises - Seated Scapular Protraction and Retraction with Dowel  - 1 x daily - 7 x weekly - 3 sets -  8 reps - Seated Shoulder Flexion AAROM with Dowel  - 1 x daily - 7 x weekly - 3 sets - 8 reps - Seated Cervical Extension AROM  - 1 x daily - 7 x weekly - 3 sets - 8 reps  ASSESSMENT:  CLINICAL IMPRESSION: Patient is a 89 y.o. female who was seen today for physical therapy evaluation and treatment for Cervical pain. Pt's examination overall limited today due to significant baseline postural limitations only gathering info with seated testing. Pt does not present with cervical radiculopathy symptoms but likely cervical and L shoulder pain due to significant forward head posture, neck flexor contracture and kyphosis. Pt does have signs/symptoms likely of shoulder impingement with concordant pain with overhead mobility and open can test but this is also likely to GHJ IR/adduction and protracted scapulae from her posture. These deficits are limiting pt with maintain independent L shoulder overhead use for ADL completion and pain free WB onto rollator. Pt will benefit from skilled PT services to  address these impairments leading to decreased participation with overhead ADL's and use of rollator to decreased pain and improve QoL.  OBJECTIVE IMPAIRMENTS: Abnormal gait, decreased mobility, decreased ROM, decreased strength, hypomobility, impaired flexibility, impaired UE functional use, improper body mechanics, postural dysfunction, and pain.   ACTIVITY LIMITATIONS: carrying, lifting, standing, bathing, toileting, dressing, reach over head, and hygiene/grooming  PARTICIPATION LIMITATIONS:  LUE weightbearing on rollator with gait  PERSONAL FACTORS: Age, Past/current experiences, Time since onset of injury/illness/exacerbation, and 3+ comorbidities: Anemia, CHF, DDD, depression, HLD, pulmonary HTN  are also affecting patient's functional outcome.   REHAB POTENTIAL: Poor Chronicity of symptoms, age, postural and position limitations  CLINICAL DECISION MAKING: Evolving/moderate complexity  EVALUATION COMPLEXITY: Moderate   GOALS: Goals reviewed with patient? No  SHORT TERM GOALS: Target date: 05/13/23  Pt will be compliant with HEP with min to modA from caregiver to address L cervical and shoulder pain and improve posture/cervical mobility Baseline: 04/22/23: provided initial HEP Goal status: INITIAL   LONG TERM GOALS: Target date: 06/03/23  Pt will improve FOTO to target score to demonstrate clinically significant improvement in functional mobility. Baseline: 8/20: 40/48 Goal status: INITIAL  2.  Pt will report on average worst pain 4/10 or less to demonstrate clinically significant reduction in cervical pain.  Baseline: 04/22/23: 6/10 NPS on average worst pain Goal status: INITIAL  3.  Pt will improve cervical extension AROM by at least 5 degrees to assist in neutral head positioning for decreased neck pain and flexor contracture  Baseline: 04/22/23: 20 degrees Goal status: INITIAL   PLAN:  PT FREQUENCY: 1-2x/week  PT DURATION: 6 weeks  PLANNED INTERVENTIONS:  Therapeutic exercises, Therapeutic activity, Neuromuscular re-education, Balance training, Gait training, Patient/Family education, Self Care, Joint mobilization, Dry Needling, Spinal mobilization, Moist heat, Manual therapy, and Re-evaluation  PLAN FOR NEXT SESSION: Postural strengthening, cervical extension, pec major stretch   Delphia Grates. Fairly IV, PT, DPT Physical Therapist- [place]  [facility], [place]  04/22/2023, 4:16 PM

## 2023-05-07 ENCOUNTER — Ambulatory Visit
Admission: RE | Admit: 2023-05-07 | Discharge: 2023-05-07 | Disposition: A | Discharge status: Home / Self Care | Payer: Medicare HMO | Source: Ambulatory Visit | Attending: Internal Medicine | Admitting: Internal Medicine

## 2023-05-07 DIAGNOSIS — R1310 Dysphagia, unspecified: Secondary | ICD-10-CM | POA: Insufficient documentation

## 2023-05-07 DIAGNOSIS — K219 Gastro-esophageal reflux disease without esophagitis: Secondary | ICD-10-CM | POA: Diagnosis not present

## 2023-05-13 ENCOUNTER — Ambulatory Visit: Payer: Medicare HMO | Attending: Internal Medicine

## 2023-05-13 DIAGNOSIS — M6281 Muscle weakness (generalized): Secondary | ICD-10-CM | POA: Insufficient documentation

## 2023-05-13 DIAGNOSIS — M542 Cervicalgia: Secondary | ICD-10-CM | POA: Insufficient documentation

## 2023-05-13 DIAGNOSIS — R293 Abnormal posture: Secondary | ICD-10-CM | POA: Insufficient documentation

## 2023-05-13 NOTE — Therapy (Addendum)
OUTPATIENT PHYSICAL THERAPY CERVICAL TREATMENT   Patient Name: Kimberly Roy MRN: [id number] DOB:[birth year], 89 y.o., female Today's Date: 05/13/2023  END OF SESSION:  PT End of Session - 05/13/23 1425     Visit Number 2    Number of Visits 13    Date for PT Re-Evaluation 06/03/23    PT Start Time 1430    PT Stop Time 1510    PT Time Calculation (min) 40 min    Activity Tolerance Patient tolerated treatment well    Behavior During Therapy WFL for tasks assessed/performed              Past Medical History:  Diagnosis Date   Anemia    Anxiety    Chest pain    CHF (congestive heart failure) (HCC)    Constipation    DDD (degenerative disc disease), cervical    Depression    DVT (deep venous thrombosis) (HCC)    Dysphonia    Dyspnea    Fatty liver    Fatty liver    Headache    Hyperlipidemia    Hyperpiesia    Hypertension    Hypothyroidism    Interstitial cystitis    Left ventricular dysfunction    Lymphedema    Nephrolithiasis    Obstructive sleep apnea    Osteoarthritis    knees/cervical and lumbar spine   Pulmonary hypertension (HCC)    Pulmonary nodules    followed by Dr Meredeth Ide   Pure hypercholesterolemia    Renal cyst    right   Past Surgical History:  Procedure Laterality Date   ABDOMINAL HYSTERECTOMY     ovaries left in place   APPENDECTOMY     Back Surgeries     BACK SURGERY     BREAST REDUCTION SURGERY     3/99   CARDIAC CATHETERIZATION     cataracts Bilateral    CERVICAL SPINE SURGERY     ESOPHAGEAL MANOMETRY N/A 08/02/2015   Procedure: ESOPHAGEAL MANOMETRY (EM);  Surgeon: Elnita Maxwell, MD;  Location: [facility] ENDOSCOPY;  Service: Endoscopy;  Laterality: N/A;   ESOPHAGOGASTRODUODENOSCOPY N/A 02/27/2015   Procedure: ESOPHAGOGASTRODUODENOSCOPY (EGD);  Surgeon: Wallace Cullens, MD;  Location: [facility] ENDOSCOPY;  Service: Gastroenterology;  Laterality: N/A;   ESOPHAGOGASTRODUODENOSCOPY (EGD) WITH PROPOFOL N/A 10/30/2018   Procedure:  ESOPHAGOGASTRODUODENOSCOPY (EGD) WITH PROPOFOL;  Surgeon: Christena Deem, MD;  Location: [facility] ENDOSCOPY;  Service: Endoscopy;  Laterality: N/A;   EXCISIONAL HEMORRHOIDECTOMY     EYE SURGERY     FRACTURE SURGERY     HEMORRHOID SURGERY     HIP SURGERY  2013   Right hip surgery   JOINT REPLACEMENT     KNEE ARTHROSCOPY     left and right   ORIF FEMUR FRACTURE Right 08/07/2018   Procedure: OPEN REDUCTION INTERNAL FIXATION (ORIF) DISTAL FEMUR FRACTURE;  Surgeon: Kennedy Bucker, MD;  Location: [facility] ORS;  Service: Orthopedics;  Laterality: Right;   REDUCTION MAMMAPLASTY Bilateral YRS AGO   REPLACEMENT TOTAL KNEE Bilateral    rotator cuff surgery     blilateral   TONSILECTOMY/ADENOIDECTOMY WITH MYRINGOTOMY     VISCERAL ARTERY INTERVENTION N/A 02/25/2019   Procedure: VISCERAL ARTERY INTERVENTION;  Surgeon: Annice Needy, MD;  Location: [facility] INVASIVE CV LAB;  Service: Cardiovascular;  Laterality: N/A;   Patient Active Problem List   Diagnosis Date Noted   Fall 03/15/2023   Difficulty swallowing 02/13/2023   Bartholin cyst 02/11/2023   Abnormal CT of the abdomen 10/27/2022   Trochanteric bursitis,  right hip 10/10/2022   Earlobe lesion, right 09/12/2022   Pneumonia 07/12/2022   Hypertensive urgency 07/12/2022   Aortic stenosis 07/12/2022   Soft tissue injury 07/12/2022   Anemia 04/13/2022   B12 deficiency 04/04/2022   Interstitial cystitis 03/28/2022   Iron deficiency 02/28/2022   Right foot pain 01/29/2022   SOB (shortness of breath) 10/14/2021   Rash 03/03/2021   Skin lesion 03/03/2021   Aortic atherosclerosis (HCC) 11/20/2020   Bradycardia 06/08/2020   Itching 04/08/2020   Dyspnea 01/17/2020   Chronic respiratory failure with hypoxia (HCC) 01/17/2020   Right hip pain 01/10/2020   Leukocytosis 11/15/2019   Wound of buttock 10/25/2019   COVID-19 virus infection 10/04/2019   Urinary frequency 09/26/2019   Cold feeling 09/12/2019   Knee pain 07/04/2019   Hemoptysis 06/30/2019    Chronic diastolic CHF (congestive heart failure) (HCC) 08/10/2018   Femur fracture (HCC) 08/06/2018   Primary osteoarthritis of right shoulder 08/15/2017   Right shoulder pain 05/16/2017   Chronic venous insufficiency 02/10/2017   Lymphedema 02/10/2017   Neuropathy 12/25/2016   Anemia due to blood loss, chronic 08/07/2016   GERD (gastroesophageal reflux disease) 12/10/2015   Finger pain 11/13/2015   Carotid artery calcification 09/26/2015   External nasal lesion 09/25/2015   Muscle cramps 09/01/2015   Excessive sweating 07/30/2015   Headache 07/16/2015   Groin pain 05/14/2015   Muscle twitching 03/05/2015   Leg cramps 02/11/2015   Back pain 02/11/2015   Abdominal pain 02/11/2015   Chronic pain 01/19/2015   Acute cystitis without hematuria 12/12/2014   Left elbow pain 11/30/2014   Health care maintenance 11/30/2014   Osteoporosis 10/19/2014   Rectal bleeding 10/19/2014   Neck pain 09/03/2014   Unsteady gait 09/03/2014   Nocturia 09/03/2014   Dysphagia 06/01/2014   Nasal dryness 06/01/2014   Stress 06/01/2014   Fatigue 02/27/2014   Degenerative disc disease 12/21/2013   Pre-op evaluation 10/12/2013   Hoarseness 08/14/2013   Leg swelling 01/24/2013   CHF (congestive heart failure) (HCC) 12/29/2012   Cough 12/29/2012   OSA (obstructive sleep apnea) 07/09/2012   Osteoarthritis 07/04/2012   Symptomatic anemia 07/04/2012   Chronic constipation 07/04/2012   Pulmonary hypertension (HCC) 07/04/2012   Pulmonary nodules 07/04/2012   Hypertension 07/04/2012   Hyperlipidemia 07/04/2012   Hypothyroidism 07/04/2012    PCP: Dale La Chuparosa, MD  REFERRING PROVIDER: Dale Orleans, MD  REFERRING DIAG: M54.2 (ICD-10-CM) - Neck pain  THERAPY DIAG:  Cervicalgia  Muscle weakness (generalized)  Abnormal posture  Rationale for Evaluation and Treatment: Rehabilitation  ONSET DATE: ~9 months ago  SUBJECTIVE:  SUBJECTIVE STATEMENT:  Pt reports a significant amount of pain in the UE's upon arrival.  Pt notes she took pain medicine at 1:30 PM today, so it is feeling a little better.    Hand dominance: Right  PERTINENT HISTORY:  Pt reports having chronic cervical pain for many years. History of cervical surgery in her 30's per pt reports. Pain has worsened gradually over the last several months. Referred pain down her LUE at mid deltoid. Reports pain as dull and achey. Reports having trouble using her LUE on her rollator in weightbearing and L shoulder pain with overhead activities. Denies N/T into her LUE or L hand weakness. Pt does have very kyphotic posture and cervical flexion with difficulty holding her head in a neutral position during subjective reports. Worst pain up to 9/10 NPS, best pain 4-5/10 NPS. Pain currently 6/10 NPS which she reports is her average.   PAIN:  Are you having pain? Yes: NPRS scale: 6/10 Pain location: L upper trap to top of L shoulder  Pain description: Dull and achey Aggravating factors: WB on rollator, overhead use with LUE.  Relieving factors: medications  PRECAUTIONS: None  RED FLAGS: Bowel or bladder incontinence: No, Spinal tumors: No, and Cauda equina syndrome: No     WEIGHT BEARING RESTRICTIONS: No  FALLS:  Has patient fallen in last 6 months? No  LIVING ENVIRONMENT: Lives with: lives with their son Lives in: House/apartment Has following equipment at home: Single point cane, Environmental consultant - 4 wheeled, Tour manager, and bed side commode  OCCUPATION: Retired  PLOF: Independent with transfers, Requires assistive device for independence, Needs assistance with ADLs, and Needs assistance with homemaking  PATIENT GOALS: Improve her pain   NEXT MD VISIT: September 2024  OBJECTIVE:   DIAGNOSTIC FINDINGS:  CLINICAL DATA:  Neck  trauma (Age >= 65y)   EXAM: CT CERVICAL SPINE WITHOUT CONTRAST   TECHNIQUE: Multidetector CT imaging of the cervical spine was performed without intravenous contrast. Multiplanar CT image reconstructions were also generated.   RADIATION DOSE REDUCTION: This exam was performed according to the departmental dose-optimization program which includes automated exposure control, adjustment of the mA and/or kV according to patient size and/or use of iterative reconstruction technique.   COMPARISON:  08/18/2015   FINDINGS: Alignment: Facet joints are aligned without dislocation or traumatic listhesis. Dens and lateral masses are aligned. New grade 1 anterolisthesis of C3 on C4 secondary to progressive degenerative facet arthropathy.   Skull base and vertebrae: No acute fracture. No primary bone lesion or focal pathologic process. Congenital nonunion of the posterior arch of C1.   Soft tissues and spinal canal: No prevertebral fluid or swelling. No visible canal hematoma.   Disc levels: Advanced multilevel degenerative disc disease and facet arthropathy throughout the cervical spine, progressed since 2016. Prominent degenerative pannus formation posterior to the odontoid process resulting in canal narrowing at the C1-C2 level. Multiple additional levels of foraminal and canal stenosis, not well assessed by CT.   Upper chest: Mosaic attenuation of the included lung apices.   Other: Bilateral carotid atherosclerosis.   IMPRESSION: 1. No acute fracture or traumatic listhesis of the cervical spine. 2. Advanced multilevel degenerative disc disease and facet arthropathy throughout the cervical spine, progressed since 2016. 3. Mosaic attenuation of the included lung apices, which can be seen in the setting of small airways disease.     Electronically Signed   By: Duanne Guess D.O.   On: 02/22/2023 10:32  PATIENT SURVEYS:  FOTO 40/48  COGNITION: Overall  cognitive status:  Within functional limits for tasks assessed  SENSATION: Light touch: WFL  POSTURE: rounded shoulders, forward head, decreased lumbar lordosis, increased thoracic kyphosis, and flexed trunk   PALPATION: TTP along L cervical paraspinals and upper trap. Painful to palpate at L biceps tendon.TTP also on bilat pec major.   CERVICAL ROM:   Active ROM A/PROM (deg) eval  Flexion 60  Extension 20*  Right lateral flexion Unable  Left lateral flexion Unable  Right rotation 40  Left rotation 42   (Blank rows = not tested)  UPPER EXTREMITY ROM:  Active ROM Right eval Left eval  Shoulder flexion 124 122*  Shoulder extension    Shoulder abduction    Shoulder adduction    Shoulder extension    Shoulder internal rotation    Shoulder external rotation    Elbow flexion WNL WNL  Elbow extension WNL WNL  Wrist flexion    Wrist extension    Wrist ulnar deviation    Wrist radial deviation    Wrist pronation    Wrist supination     (Blank rows = not tested)  Shoulder ER, flexion, abduction to occiput  Shoulder extension, IR, adduction to T8/bra strap. Painful on L shoulder.   UPPER EXTREMITY MMT:  MMT Right eval Left eval  Shoulder flexion 4* 4*  Shoulder extension    Shoulder abduction 5* 5*  Shoulder adduction    Shoulder extension    Shoulder internal rotation    Shoulder external rotation    Middle trapezius    Lower trapezius    Elbow flexion 4 4*  Elbow extension 5 5  Wrist flexion 5 5  Wrist extension 5 5  Wrist ulnar deviation    Wrist radial deviation    Wrist pronation    Wrist supination    Grip strength Symmetrical Symmetrical   (Blank rows = not tested)  CERVICAL/SHOULDER SPECIAL TESTS:  Open can: positive on LUE     FUNCTIONAL TESTS:  Pt with significant cervical flexor contracture with attempts to lie supine. Unable to tolerate even inclined due to pt quickly reporting dizziness. Thus unable to assess further shoulder joint mobility,  PROM  TODAY'S TREATMENT: DATE: 05/13/23  TherEx:  Semi-fowlers position for comfort level against wedge: Shoulder flexion to end range utilizing PVC pipe, 2x15 Shoulder ER with red theraband, 2x10 Shoulder horizontal abduction with theraband, 2x10 Bicep curls with red theraband around foot for anchor, 2x10  Moist heat applied for exercises listed above and during manul therapy for pain modulation.  Skin assessed 60 sec and 2 minutes into the modality and found to be warm to touch, with no other adverse reactions.    Seated shoulder rolls into posterior rotation, 2x10 Seated cervical extension to end range, 2x10 Static standing against wall with UE support on walker, cervical extension for 60 seconds, pt noted increased difficulty due to leg length discrepancy Seated chin tucks, with limited mobility in the cervical region, x10  Manual Therapy:  STM to B UT's with pt in semi fowler position for pain modulation and improved tissue extensibility STM to thoracic and cervical paraspinals for reduction in overall pain    PATIENT EDUCATION:  Education details: Prognosis, HEP, POC Person educated: Patient, Child(ren), and Caregiver   Education method: Explanation, Demonstration, Tactile cues, Verbal cues, and Handouts Education comprehension: verbalized understanding, returned demonstration, verbal cues required, tactile cues required, and needs further education  HOME EXERCISE PROGRAM: Access Code: [email or web address] URL: [email or web address] Date: 05/13/2023 Prepared by: Ronnie Derby & Josh  Dyasia Firestine  Exercises - Dance movement psychotherapist and Retraction with Dowel  - 1 x daily - 7 x weekly - 3 sets - 8 reps - Seated Shoulder Flexion AAROM with Dowel  - 1 x daily - 7 x weekly - 3 sets - 8 reps - Seated Cervical Extension AROM  - 1 x daily - 7 x weekly - 3 sets - 8 reps - Shoulder Rolls in Sitting  - 1 x daily - 7 x weekly - 3 sets - 10 reps - Supine Bicep Curl with Cane  -  1 x daily - 7 x weekly - 3 sets - 10 reps  ASSESSMENT:  CLINICAL IMPRESSION:  Pt responded fair to the exercises, however continues to have significant pain in the biceps when placing weight through the UE's for support when in an upright position.  Pt did respond well to the heat applied, and was noted to have a sebaceous cyst in the thoracic region, with pt reporting it to be removed soon.  Pt lacks significant movement in the cervical region and has difficulty holding the head upright >50 seconds without pain.   Pt will continue to benefit from skilled therapy to address remaining deficits in order to improve overall QoL and return to PLOF.     OBJECTIVE IMPAIRMENTS: Abnormal gait, decreased mobility, decreased ROM, decreased strength, hypomobility, impaired flexibility, impaired UE functional use, improper body mechanics, postural dysfunction, and pain.   ACTIVITY LIMITATIONS: carrying, lifting, standing, bathing, toileting, dressing, reach over head, and hygiene/grooming  PARTICIPATION LIMITATIONS:  LUE weightbearing on rollator with gait  PERSONAL FACTORS: Age, Past/current experiences, Time since onset of injury/illness/exacerbation, and 3+ comorbidities: Anemia, CHF, DDD, depression, HLD, pulmonary HTN  are also affecting patient's functional outcome.   REHAB POTENTIAL: Poor Chronicity of symptoms, age, postural and position limitations  CLINICAL DECISION MAKING: Evolving/moderate complexity  EVALUATION COMPLEXITY: Moderate   GOALS: Goals reviewed with patient? No  SHORT TERM GOALS: Target date: 05/13/23  Pt will be compliant with HEP with min to modA from caregiver to address L cervical and shoulder pain and improve posture/cervical mobility Baseline: 04/22/23: provided initial HEP Goal status: INITIAL   LONG TERM GOALS: Target date: 06/03/23  Pt will improve FOTO to target score to demonstrate clinically significant improvement in functional mobility. Baseline: 8/20:  40/48 Goal status: INITIAL  2.  Pt will report on average worst pain 4/10 or less to demonstrate clinically significant reduction in cervical pain.  Baseline: 04/22/23: 6/10 NPS on average worst pain Goal status: INITIAL  3.  Pt will improve cervical extension AROM by at least 5 degrees to assist in neutral head positioning for decreased neck pain and flexor contracture  Baseline: 04/22/23: 20 degrees Goal status: INITIAL   PLAN:  PT FREQUENCY: 1-2x/week  PT DURATION: 6 weeks  PLANNED INTERVENTIONS: Therapeutic exercises, Therapeutic activity, Neuromuscular re-education, Balance training, Gait training, Patient/Family education, Self Care, Joint mobilization, Dry Needling, Spinal mobilization, Moist heat, Manual therapy, and Re-evaluation  PLAN FOR NEXT SESSION:  Postural strengthening, cervical extension, pec major stretch    Nolon Bussing, PT, DPT Physical Therapist - [facility]  [facility]  05/13/23, 3:31 PM

## 2023-05-14 ENCOUNTER — Ambulatory Visit: Payer: Medicare HMO | Admitting: Student in an Organized Health Care Education/Training Program

## 2023-05-14 ENCOUNTER — Encounter: Payer: Self-pay | Admitting: Student in an Organized Health Care Education/Training Program

## 2023-05-14 VITALS — BP 138/66 | HR 36 | Temp 97.6°F | Ht 62.0 in | Wt 172.0 lb

## 2023-05-14 DIAGNOSIS — J4530 Mild persistent asthma, uncomplicated: Secondary | ICD-10-CM | POA: Diagnosis not present

## 2023-05-14 NOTE — Progress Notes (Unsigned)
Synopsis: Referred in *** by [name], MD  Assessment & Plan:   There are no diagnoses linked to this encounter.  Smptoms much improved with symbicort. Cough resolved, shortness of breath improved. No complaints. One year follow up.  No follow-ups on file.  I spent *** minutes caring for this patient today, including {EM billing:[id number]}  [name], MD [facility] 05/14/2023 1:51 PM    End of visit medications:  No orders of the defined types were placed in this encounter.    Current Outpatient Medications:    albuterol (VENTOLIN HFA) 108 (90 Base) MCG/ACT inhaler, INHALE 2 PUFFS INTO THE LUNGS EVERY 6 HOURS AS NEEDED FOR WHEEZING OR SHORTNESS OF BREATH, Disp: 18 g, Rfl: 0   aluminum-magnesium hydroxide-simethicone (MAALOX) 200-200-20 MG/5ML SUSP, Take 15 mLs by mouth 4 (four) times daily -  before meals and at bedtime. , Disp: , Rfl:    amLODipine (NORVASC) 10 MG tablet, TAKE 1 TABLET BY MOUTH DAILY, Disp: 90 tablet, Rfl: 1   aspirin 81 MG EC tablet, Take 81 mg by mouth daily. Every other day, Disp: , Rfl:    atorvastatin (LIPITOR) 20 MG tablet, TAKE 1 TABLET BY MOUTH AT BEDTIME, Disp: 90 tablet, Rfl: 1   benazepril (LOTENSIN) 40 MG tablet, TAKE 1 TABLET BY MOUTH DAILY, Disp: 90 tablet, Rfl: 1   betamethasone dipropionate 0.05 % cream, Apply 1 application topically 2 (two) times daily., Disp: , Rfl:    budesonide-formoterol (SYMBICORT) 80-4.5 MCG/ACT inhaler, Inhale 2 puffs into the lungs in the morning and at bedtime., Disp: 1 each, Rfl: 12   busPIRone (BUSPAR) 10 MG tablet, Take 10 mg by mouth 3 (three) times daily., Disp: , Rfl:    calcium citrate-vitamin D (CITRACAL+D) 315-200 MG-UNIT tablet, Take 2 tablets by mouth daily., Disp: , Rfl:    Cholecalciferol (VITAMIN D3) 1000 UNITS CAPS, Take 1 capsule by mouth daily., Disp: , Rfl:    clobetasol cream (TEMOVATE) 0.05 %, Apply 1 application topically See admin instructions. Apply to affected areas  of body 1 - 2 times daily as needed for itchy bumps. Avoid applying to face, groin, and axilla. Use as directed., Disp: 60 g, Rfl: 1   cyanocobalamin (VITAMIN B12) 1000 MCG/ML injection, Inject 1 mL (1,000 mcg total) into the muscle every 3 (three) months., Disp: 12 mL, Rfl: 0   Diclofenac Sodium 3 % GEL, Apply topically 2 (two) times daily., Disp: , Rfl:    Docusate Sodium (DSS) 100 MG CAPS, Take 100 mg by mouth 2 (two) times daily., Disp: , Rfl:    DULoxetine (CYMBALTA) 60 MG capsule, Take 60 mg by mouth daily., Disp: , Rfl:    Fluocinolone Acetonide 0.01 % OIL, Apply 1-2 drops into ears once to twice daily as needed., Disp: 20 mL, Rfl: 3   fluocinonide (LIDEX) 0.05 % external solution, Apply topically 2 (two) times daily. Apply drops to scalp for itch, Disp: 60 mL, Rfl: 3   fluticasone (FLONASE) 50 MCG/ACT nasal spray, Place 2 sprays into both nostrils daily., Disp: 16 g, Rfl: 6   furosemide (LASIX) 20 MG tablet, TAKE 1 TABLET BY MOUTH DAILY AS NEEDED, Disp: 90 tablet, Rfl: 0   gabapentin (NEURONTIN) 300 MG capsule, Take two tablets tid (in am, midday and q hs)., Disp: 540 capsule, Rfl: 1   HYDROcodone-acetaminophen (NORCO) 10-325 MG tablet, Take 1 tablet by mouth 3 (three) times daily., Disp: 10 tablet, Rfl: 0   Infant Care Products (DERMACLOUD) OINT, Apply topically as  directed (as discussed)., Disp: 430 g, Rfl: 1   ipratropium (ATROVENT) 0.03 % nasal spray, Place 2 sprays into both nostrils every 12 (twelve) hours., Disp: 30 mL, Rfl: 1   ketoconazole (NIZORAL) 2 % shampoo, Apply 1 Application topically See admin instructions. apply three times per week, massage into scalp and leave in for 10 minutes before rinsing out, Disp: 120 mL, Rfl: 3   levalbuterol (XOPENEX) 0.63 MG/3ML nebulizer solution, Take 3 mLs (0.63 mg total) by nebulization every 6 (six) hours as needed for wheezing or shortness of breath., Disp: 3 mL, Rfl: 1   LINZESS 145 MCG CAPS capsule, Take 145 mcg by mouth daily., Disp: ,  Rfl:    magnesium oxide (MAG-OX) 400 MG tablet, Take 1 tablet (400 mg total) by mouth daily., Disp: 30 tablet, Rfl: 1   metoprolol succinate (TOPROL-XL) 50 MG 24 hr tablet, TAKE 1 TABLET BY MOUTH DAILY. TAKE WITH OR IMMEDIATELY FOLLOWING A MEAL., Disp: 90 tablet, Rfl: 1   morphine (MS CONTIN) 30 MG 12 hr tablet, Take 30 mg by mouth every 12 (twelve) hours., Disp: , Rfl:    morphine (MS CONTIN) 60 MG 12 hr tablet, Take 1 tablet (60 mg total) by mouth 2 (two) times daily., Disp: 10 tablet, Rfl: 0   mupirocin ointment (BACTROBAN) 2 %, Apply 1 Application topically 2 (two) times daily., Disp: 22 g, Rfl: 0   nystatin (MYCOSTATIN/NYSTOP) powder, Apply 1 application. topically 2 (two) times daily as needed., Disp: 60 g, Rfl: 0   nystatin cream (MYCOSTATIN), Apply 1 Application topically 2 (two) times daily., Disp: 60 g, Rfl: 0   polyethylene glycol powder (GLYCOLAX/MIRALAX) 17 GM/SCOOP powder, MIX 17 GRAMS AS MARKED ON BOTTLE TOP IN 8 OUNCES OF WATER AND DRINK ONCE A DAY AS DIRECTED., Disp: 527 g, Rfl: 0   RABEprazole (ACIPHEX) 20 MG tablet, TAKE 1 TABLET BY MOUTH TWICE A DAY BEFORE A MEAL, Disp: 180 tablet, Rfl: 1   Simethicone (GAS-X PO), Take 2 tablets by mouth 2 (two) times daily., Disp: , Rfl:    sodium chloride (OCEAN) 0.65 % nasal spray, Place 1 spray into the nose as needed., Disp: , Rfl:    SYNTHROID 100 MCG tablet, TAKE 1 TABLET BY MOUTH DAILY, Disp: 90 tablet, Rfl: 3   triamcinolone ointment (KENALOG) 0.1 %, APPLY TWICE DAILY TO BITES AND RASH UNTIL FLAT AND SMOOTH **DO NOT APPLY TO FACE**, Disp: 80 g, Rfl: 0   Subjective:   PATIENT ID: Kimberly Roy GENDER: female DOB: [birth year], MRN: [id number]  Chief Complaint  Patient presents with   Follow-up    Patient denies any cough, shortness of breath or wheezing.     HPI ***  Ancillary information including prior medications, full medical/surgical/family/social histories, and PFTs (when available) are listed below and have been reviewed.    ROS   Objective:   Vitals:   05/14/23 1345  BP: 138/66  Pulse: (!) 36  Temp: 97.6 F (36.4 C)  TempSrc: Temporal  SpO2: 94%  Weight: 172 lb (78 kg)  Height: 5\' 2"  (1.575 m)   94% on *** LPM *** RA BMI Readings from Last 3 Encounters:  05/14/23 31.46 kg/m  03/11/23 31.46 kg/m  02/22/23 31.46 kg/m   Wt Readings from Last 3 Encounters:  05/14/23 172 lb (78 kg)  03/11/23 172 lb (78 kg)  02/22/23 172 lb (78 kg)    Physical Exam    Ancillary Information    Past Medical History:  Diagnosis Date   Anemia  Anxiety    Chest pain    CHF (congestive heart failure) (HCC)    Constipation    DDD (degenerative disc disease), cervical    Depression    DVT (deep venous thrombosis) (HCC)    Dysphonia    Dyspnea    Fatty liver    Fatty liver    Headache    Hyperlipidemia    Hyperpiesia    Hypertension    Hypothyroidism    Interstitial cystitis    Left ventricular dysfunction    Lymphedema    Nephrolithiasis    Obstructive sleep apnea    Osteoarthritis    knees/cervical and lumbar spine   Pulmonary hypertension (HCC)    Pulmonary nodules    followed by Dr Meredeth Ide   Pure hypercholesterolemia    Renal cyst    right     Family History  Problem Relation Age of Onset   Heart disease Mother    Stroke Mother    Hypertension Mother    Heart disease Father        myocardial infarction age 18   Breast cancer Neg Hx      Past Surgical History:  Procedure Laterality Date   ABDOMINAL HYSTERECTOMY     ovaries left in place   APPENDECTOMY     Back Surgeries     BACK SURGERY     BREAST REDUCTION SURGERY     3/99   CARDIAC CATHETERIZATION     cataracts Bilateral    CERVICAL SPINE SURGERY     ESOPHAGEAL MANOMETRY N/A 08/02/2015   Procedure: ESOPHAGEAL MANOMETRY (EM);  Surgeon: Elnita Maxwell, MD;  Location: [facility] ENDOSCOPY;  Service: Endoscopy;  Laterality: N/A;   ESOPHAGOGASTRODUODENOSCOPY N/A 02/27/2015   Procedure: ESOPHAGOGASTRODUODENOSCOPY  (EGD);  Surgeon: Wallace Cullens, MD;  Location: [facility] ENDOSCOPY;  Service: Gastroenterology;  Laterality: N/A;   ESOPHAGOGASTRODUODENOSCOPY (EGD) WITH PROPOFOL N/A 10/30/2018   Procedure: ESOPHAGOGASTRODUODENOSCOPY (EGD) WITH PROPOFOL;  Surgeon: Christena Deem, MD;  Location[facility] ENDOSCOPY;  Service: Endoscopy;  Laterality: N/A;   EXCISIONAL HEMORRHOIDECTOMY     EYE SURGERY     FRACTURE SURGERY     HEMORRHOID SURGERY     HIP SURGERY  2013   Right hip surgery   JOINT REPLACEMENT     KNEE ARTHROSCOPY     left and right   ORIF FEMUR FRACTURE Right 08/07/2018   Procedure: OPEN REDUCTION INTERNAL FIXATION (ORIF) DISTAL FEMUR FRACTURE;  Surgeon: Kennedy Bucker, MD;  Location: [facility] ORS;  Service: Orthopedics;  Laterality: Right;   REDUCTION MAMMAPLASTY Bilateral YRS AGO   REPLACEMENT TOTAL KNEE Bilateral    rotator cuff surgery     blilateral   TONSILECTOMY/ADENOIDECTOMY WITH MYRINGOTOMY     VISCERAL ARTERY INTERVENTION N/A 02/25/2019   Procedure: VISCERAL ARTERY INTERVENTION;  Surgeon: Annice Needy, MD;  Location: [facility] INVASIVE CV LAB;  Service: Cardiovascular;  Laterality: N/A;    Social History   Socioeconomic History   Marital status: Widowed    Spouse name: Not on file   Number of children: 2   Years of education: 48   Highest education level: Associate degree: occupational, Scientist, product/process development, or vocational program  Occupational History    Comment: insurance agency  Tobacco Use   Smoking status: Never   Smokeless tobacco: Never  Vaping Use   Vaping status: Never Used  Substance and Sexual Activity   Alcohol use: No    Alcohol/week: 0.0 standard drinks of alcohol   Drug use: Never   Sexual activity: Not  Currently  Other Topics Concern   Not on file  Social History Narrative   Not on file   Social Determinants of Health   Financial Resource Strain: Low Risk  (02/13/2023)   Overall Financial Resource Strain (CARDIA)    Difficulty of Paying Living Expenses: Not very hard  Food  Insecurity: No Food Insecurity (02/26/2023)   Hunger Vital Sign    Worried About Running Out of Food in the Last Year: Never true    Ran Out of Food in the Last Year: Never true  Transportation Needs: No Transportation Needs (02/26/2023)   PRAPARE - Administrator, Civil Service (Medical): No    Lack of Transportation (Non-Medical): No  Physical Activity: Inactive (02/13/2023)   Exercise Vital Sign    Days of Exercise per Week: 0 days    Minutes of Exercise per Session: 10 min  Stress: No Stress Concern Present (02/13/2023)   [employer] of Occupational Health - Occupational Stress Questionnaire    Feeling of Stress : Only a little  Social Connections: Moderately Isolated (02/13/2023)   Social Connection and Isolation Panel [NHANES]    Frequency of Communication with Friends and Family: More than three times a week    Frequency of Social Gatherings with Friends and Family: Once a week    Attends Religious Services: 1 to 4 times per year    Active Member of [employer] or Organizations: No    Attends Banker Meetings: Never    Marital Status: Widowed  Intimate Partner Violence: Not At Risk (11/25/2022)   Humiliation, Afraid, Rape, and Kick questionnaire    Fear of Current or Ex-Partner: No    Emotionally Abused: No    Physically Abused: No    Sexually Abused: No     Allergies  Allergen Reactions   Lyrica [Pregabalin] Swelling   Omnicef [Cefdinir] Diarrhea and Nausea And Vomiting   Atarax [Hydroxyzine]     jittery   Dicyclomine Other (See Comments)    Abdominal bloating    Hydroxyzine Hcl     jittery   Levaquin [Levofloxacin] Swelling   Nitrofurantoin Diarrhea   Nucynta Er [Tapentadol Hcl Er] Other (See Comments)    Severe constipation    Oxybutynin Other (See Comments)    Blurred vision   Zoloft [Sertraline Hcl]     Severe headache   Biaxin [Clarithromycin] Other (See Comments) and Rash    Pt does not remember Pt does not remember   Meloxicam  Rash   Other Rash    Other Reaction(s): Other (See Comments)  Durabond - redness, Durabond - redness   Sertraline Nausea And Vomiting    Severe headache  Other reaction(s): Headache   Silicone Other (See Comments) and Rash    Durabond - redness Durabond - redness    Durabond - redness   Sulfa Antibiotics Rash    Pt does not remember   Sulfasalazine Rash    Pt does not remember   Tape Rash    Durabond - redness   Tapentadol Other (See Comments) and Rash    Severe constipation Severe constipation Severe constipation Severe constipation Severe constipation   Venofer [Iron Sucrose] Rash     CBC    Component Value Date/Time   WBC 5.6 02/12/2023 1408   RBC 3.10 (L) 02/12/2023 1408   HGB 10.5 (L) 02/12/2023 1408   HGB 10.4 (L) 10/29/2019 0000   HCT 32.0 (L) 02/12/2023 1408   HCT 30.8 (L) 10/29/2019 0000   PLT 133 (  L) 02/12/2023 1408   PLT 284 10/29/2019 0000   MCV 103.2 (H) 02/12/2023 1408   MCV 95 10/29/2019 0000   MCV 94 04/18/2014 0944   MCH 33.9 02/12/2023 1408   MCHC 32.8 02/12/2023 1408   RDW 13.7 02/12/2023 1408   RDW 12.5 10/29/2019 0000   RDW 14.8 (H) 04/18/2014 0944   LYMPHSABS 1.5 02/12/2023 1408   LYMPHSABS 2.0 10/29/2019 0000   LYMPHSABS 2.4 04/18/2014 0944   MONOABS 0.5 02/12/2023 1408   MONOABS 0.6 04/18/2014 0944   EOSABS 0.1 02/12/2023 1408   EOSABS 0.3 10/29/2019 0000   EOSABS 0.4 04/18/2014 0944   BASOSABS 0.0 02/12/2023 1408   BASOSABS 0.0 10/29/2019 0000   BASOSABS 0.0 04/18/2014 0944    Pulmonary Functions Testing Results:    Latest Ref Rng & Units 01/17/2020    1:10 PM  PFT Results  FVC-Pre L 1.71   FVC-Predicted Pre % 81   FVC-Post L 1.71   FVC-Predicted Post % 81   Pre FEV1/FVC % % 80   Post FEV1/FCV % % 83   FEV1-Pre L 1.36   FEV1-Predicted Pre % 88   FEV1-Post L 1.42   DLCO uncorrected ml/min/mmHg 11.24   DLCO UNC% % 65   DLCO corrected ml/min/mmHg 12.35   DLCO COR %Predicted % 71   DLVA Predicted % 100   TLC L 3.62    TLC % Predicted % 76   RV % Predicted % 81     Outpatient Medications Prior to Visit  Medication Sig Dispense Refill   albuterol (VENTOLIN HFA) 108 (90 Base) MCG/ACT inhaler INHALE 2 PUFFS INTO THE LUNGS EVERY 6 HOURS AS NEEDED FOR WHEEZING OR SHORTNESS OF BREATH 18 g 0   aluminum-magnesium hydroxide-simethicone (MAALOX) 200-200-20 MG/5ML SUSP Take 15 mLs by mouth 4 (four) times daily -  before meals and at bedtime.      amLODipine (NORVASC) 10 MG tablet TAKE 1 TABLET BY MOUTH DAILY 90 tablet 1   aspirin 81 MG EC tablet Take 81 mg by mouth daily. Every other day     atorvastatin (LIPITOR) 20 MG tablet TAKE 1 TABLET BY MOUTH AT BEDTIME 90 tablet 1   benazepril (LOTENSIN) 40 MG tablet TAKE 1 TABLET BY MOUTH DAILY 90 tablet 1   betamethasone dipropionate 0.05 % cream Apply 1 application topically 2 (two) times daily.     budesonide-formoterol (SYMBICORT) 80-4.5 MCG/ACT inhaler Inhale 2 puffs into the lungs in the morning and at bedtime. 1 each 12   busPIRone (BUSPAR) 10 MG tablet Take 10 mg by mouth 3 (three) times daily.     calcium citrate-vitamin D (CITRACAL+D) 315-200 MG-UNIT tablet Take 2 tablets by mouth daily.     Cholecalciferol (VITAMIN D3) 1000 UNITS CAPS Take 1 capsule by mouth daily.     clobetasol cream (TEMOVATE) 0.05 % Apply 1 application topically See admin instructions. Apply to affected areas of body 1 - 2 times daily as needed for itchy bumps. Avoid applying to face, groin, and axilla. Use as directed. 60 g 1   cyanocobalamin (VITAMIN B12) 1000 MCG/ML injection Inject 1 mL (1,000 mcg total) into the muscle every 3 (three) months. 12 mL 0   Diclofenac Sodium 3 % GEL Apply topically 2 (two) times daily.     Docusate Sodium (DSS) 100 MG CAPS Take 100 mg by mouth 2 (two) times daily.     DULoxetine (CYMBALTA) 60 MG capsule Take 60 mg by mouth daily.     Fluocinolone Acetonide 0.01 %  OIL Apply 1-2 drops into ears once to twice daily as needed. 20 mL 3   fluocinonide (LIDEX) 0.05 %  external solution Apply topically 2 (two) times daily. Apply drops to scalp for itch 60 mL 3   fluticasone (FLONASE) 50 MCG/ACT nasal spray Place 2 sprays into both nostrils daily. 16 g 6   furosemide (LASIX) 20 MG tablet TAKE 1 TABLET BY MOUTH DAILY AS NEEDED 90 tablet 0   gabapentin (NEURONTIN) 300 MG capsule Take two tablets tid (in am, midday and q hs). 540 capsule 1   HYDROcodone-acetaminophen (NORCO) 10-325 MG tablet Take 1 tablet by mouth 3 (three) times daily. 10 tablet 0   Infant Care Products [facility]) OINT Apply topically as directed (as discussed). 430 g 1   ipratropium (ATROVENT) 0.03 % nasal spray Place 2 sprays into both nostrils every 12 (twelve) hours. 30 mL 1   ketoconazole (NIZORAL) 2 % shampoo Apply 1 Application topically See admin instructions. apply three times per week, massage into scalp and leave in for 10 minutes before rinsing out 120 mL 3   levalbuterol (XOPENEX) 0.63 MG/3ML nebulizer solution Take 3 mLs (0.63 mg total) by nebulization every 6 (six) hours as needed for wheezing or shortness of breath. 3 mL 1   LINZESS 145 MCG CAPS capsule Take 145 mcg by mouth daily.     magnesium oxide (MAG-OX) 400 MG tablet Take 1 tablet (400 mg total) by mouth daily. 30 tablet 1   metoprolol succinate (TOPROL-XL) 50 MG 24 hr tablet TAKE 1 TABLET BY MOUTH DAILY. TAKE WITH OR IMMEDIATELY FOLLOWING A MEAL. 90 tablet 1   morphine (MS CONTIN) 30 MG 12 hr tablet Take 30 mg by mouth every 12 (twelve) hours.     morphine (MS CONTIN) 60 MG 12 hr tablet Take 1 tablet (60 mg total) by mouth 2 (two) times daily. 10 tablet 0   mupirocin ointment (BACTROBAN) 2 % Apply 1 Application topically 2 (two) times daily. 22 g 0   nystatin (MYCOSTATIN/NYSTOP) powder Apply 1 application. topically 2 (two) times daily as needed. 60 g 0   nystatin cream (MYCOSTATIN) Apply 1 Application topically 2 (two) times daily. 60 g 0   polyethylene glycol powder (GLYCOLAX/MIRALAX) 17 GM/SCOOP powder MIX 17 GRAMS AS  MARKED ON BOTTLE TOP IN 8 OUNCES OF WATER AND DRINK ONCE A DAY AS DIRECTED. 527 g 0   RABEprazole (ACIPHEX) 20 MG tablet TAKE 1 TABLET BY MOUTH TWICE A DAY BEFORE A MEAL 180 tablet 1   Simethicone (GAS-X PO) Take 2 tablets by mouth 2 (two) times daily.     sodium chloride (OCEAN) 0.65 % nasal spray Place 1 spray into the nose as needed.     SYNTHROID 100 MCG tablet TAKE 1 TABLET BY MOUTH DAILY 90 tablet 3   triamcinolone ointment (KENALOG) 0.1 % APPLY TWICE DAILY TO BITES AND RASH UNTIL FLAT AND SMOOTH **DO NOT APPLY TO FACE** 80 g 0   No facility-administered medications prior to visit.

## 2023-05-15 ENCOUNTER — Encounter: Payer: Self-pay | Admitting: Internal Medicine

## 2023-05-15 DIAGNOSIS — J45909 Unspecified asthma, uncomplicated: Secondary | ICD-10-CM | POA: Insufficient documentation

## 2023-05-19 ENCOUNTER — Ambulatory Visit (INDEPENDENT_AMBULATORY_CARE_PROVIDER_SITE_OTHER): Payer: Medicare HMO | Admitting: Internal Medicine

## 2023-05-19 ENCOUNTER — Encounter: Payer: Self-pay | Admitting: Internal Medicine

## 2023-05-19 VITALS — BP 136/70 | HR 78 | Temp 97.9°F | Resp 16 | Ht 62.0 in | Wt 177.0 lb

## 2023-05-19 DIAGNOSIS — J4520 Mild intermittent asthma, uncomplicated: Secondary | ICD-10-CM

## 2023-05-19 DIAGNOSIS — M25511 Pain in right shoulder: Secondary | ICD-10-CM | POA: Diagnosis not present

## 2023-05-19 DIAGNOSIS — J9611 Chronic respiratory failure with hypoxia: Secondary | ICD-10-CM

## 2023-05-19 DIAGNOSIS — F439 Reaction to severe stress, unspecified: Secondary | ICD-10-CM

## 2023-05-19 DIAGNOSIS — I700 Atherosclerosis of aorta: Secondary | ICD-10-CM

## 2023-05-19 DIAGNOSIS — I272 Pulmonary hypertension, unspecified: Secondary | ICD-10-CM

## 2023-05-19 DIAGNOSIS — I10 Essential (primary) hypertension: Secondary | ICD-10-CM | POA: Diagnosis not present

## 2023-05-19 DIAGNOSIS — M542 Cervicalgia: Secondary | ICD-10-CM

## 2023-05-19 DIAGNOSIS — I5032 Chronic diastolic (congestive) heart failure: Secondary | ICD-10-CM | POA: Diagnosis not present

## 2023-05-19 DIAGNOSIS — I350 Nonrheumatic aortic (valve) stenosis: Secondary | ICD-10-CM

## 2023-05-19 DIAGNOSIS — R1310 Dysphagia, unspecified: Secondary | ICD-10-CM | POA: Diagnosis not present

## 2023-05-19 DIAGNOSIS — E785 Hyperlipidemia, unspecified: Secondary | ICD-10-CM | POA: Diagnosis not present

## 2023-05-19 DIAGNOSIS — E033 Postinfectious hypothyroidism: Secondary | ICD-10-CM | POA: Diagnosis not present

## 2023-05-19 DIAGNOSIS — G8929 Other chronic pain: Secondary | ICD-10-CM

## 2023-05-19 DIAGNOSIS — M5440 Lumbago with sciatica, unspecified side: Secondary | ICD-10-CM

## 2023-05-19 DIAGNOSIS — M25512 Pain in left shoulder: Secondary | ICD-10-CM

## 2023-05-19 DIAGNOSIS — D649 Anemia, unspecified: Secondary | ICD-10-CM

## 2023-05-19 LAB — BASIC METABOLIC PANEL
BUN: 16 mg/dL (ref 6–23)
CO2: 31 meq/L (ref 19–32)
Calcium: 9.3 mg/dL (ref 8.4–10.5)
Chloride: 102 mEq/L (ref 96–112)
Creatinine, Ser: 0.78 mg/dL (ref 0.40–1.20)
GFR: 66.97 mL/min (ref >60.00)
Glucose, Bld: 75 mg/dL (ref 70–99)
Potassium: 4.5 meq/L (ref 3.5–5.1)
Sodium: 141 meq/L (ref 135–145)

## 2023-05-19 LAB — HEPATIC FUNCTION PANEL
ALT: 9 U/L (ref 0–35)
AST: 18 U/L (ref 0–37)
Albumin: 3.8 g/dL (ref 3.5–5.2)
Alkaline Phosphatase: 87 U/L (ref 39–117)
Bilirubin, Direct: 0.1 mg/dL (ref 0.0–0.3)
Total Bilirubin: 0.4 mg/dL (ref 0.2–1.2)
Total Protein: 6.2 g/dL (ref 6.0–8.3)

## 2023-05-19 LAB — TSH: TSH: 1.45 u[IU]/mL (ref 0.35–5.50)

## 2023-05-19 LAB — LIPID PANEL
Cholesterol: 125 mg/dL (ref 0–200)
HDL: 54 mg/dL (ref >39.00)
LDL Cholesterol: 58 mg/dL (ref 0–99)
NonHDL: 71.2
Total CHOL/HDL Ratio: 2
Triglycerides: 67 mg/dL (ref 0.0–149.0)
VLDL: 13.4 mg/dL (ref 0.0–40.0)

## 2023-05-19 LAB — SEDIMENTATION RATE: Sed Rate: 21 mm/h (ref 0–30)

## 2023-05-19 NOTE — Progress Notes (Signed)
Subjective:    Patient ID: Kimberly Roy, female    DOB: 08/29/1931, 89 y.o.   MRN: [id number]  Patient here for  Chief Complaint  Patient presents with   Medical Management of Chronic Issues    HPI Here to follow up regarding hypercholesterolemia, hypertension, increased stress and neck pain. Saw Dr Darrold Junker 04/07/23 - 2D echocardiogram 07/14/2022 showed normal ventricular function with LVEF 60 to 65% with mild mitral regurgitation, mild to moderate tricuspid stenosis, and mild aortic stenosis. Lexiscan Myoview 04/13/2019 revealed LVEF 72% without evidence for scar or ischemia. 72-hour Holter monitor 12/02/2022-/12/2022 revealed predominant sinus bradycardia with mean heart rate of 59 bpm, sinus heart rate range 41 to 100 bpm, frequent premature atrial contractions, and frequent brief atrial runs the longest lasting 12 beats. Metoprolol decreased to 50mg  q day. Recommended to continue lasix 20mg  daily alternating with 40mg  on Tuesday and Saturday. Had f/u with pulmonary 05/14/23.  Doing better with daily symbicort.  Continue bid. Due to f/u with pain clinic tomorrow.  Scheduled to see Dr Elenore Rota this week.    Past Medical History:  Diagnosis Date   Anemia    Anxiety    Chest pain    CHF (congestive heart failure) (HCC)    Constipation    DDD (degenerative disc disease), cervical    Depression    DVT (deep venous thrombosis) (HCC)    Dysphonia    Dyspnea    Fatty liver    Fatty liver    Headache    Hyperlipidemia    Hyperpiesia    Hypertension    Hypothyroidism    Interstitial cystitis    Left ventricular dysfunction    Lymphedema    Nephrolithiasis    Obstructive sleep apnea    Osteoarthritis    knees/cervical and lumbar spine   Pulmonary hypertension (HCC)    Pulmonary nodules    followed by Dr Meredeth Ide   Pure hypercholesterolemia    Renal cyst    right   Past Surgical History:  Procedure Laterality Date   ABDOMINAL HYSTERECTOMY     ovaries left in place   APPENDECTOMY      Back Surgeries     BACK SURGERY     BREAST REDUCTION SURGERY     3/99   CARDIAC CATHETERIZATION     cataracts Bilateral    CERVICAL SPINE SURGERY     ESOPHAGEAL MANOMETRY N/A 08/02/2015   Procedure: ESOPHAGEAL MANOMETRY (EM);  Surgeon: Elnita Maxwell, MD;  Location: [facility] ENDOSCOPY;  Service: Endoscopy;  Laterality: N/A;   ESOPHAGOGASTRODUODENOSCOPY N/A 02/27/2015   Procedure: ESOPHAGOGASTRODUODENOSCOPY (EGD);  Surgeon: Wallace Cullens, MD;  Location: [facility] ENDOSCOPY;  Service: Gastroenterology;  Laterality: N/A;   ESOPHAGOGASTRODUODENOSCOPY (EGD) WITH PROPOFOL N/A 10/30/2018   Procedure: ESOPHAGOGASTRODUODENOSCOPY (EGD) WITH PROPOFOL;  Surgeon: Christena Deem, MD;  Location: [facility] ENDOSCOPY;  Service: Endoscopy;  Laterality: N/A;   EXCISIONAL HEMORRHOIDECTOMY     EYE SURGERY     FRACTURE SURGERY     HEMORRHOID SURGERY     HIP SURGERY  2013   Right hip surgery   JOINT REPLACEMENT     KNEE ARTHROSCOPY     left and right   ORIF FEMUR FRACTURE Right 08/07/2018   Procedure: OPEN REDUCTION INTERNAL FIXATION (ORIF) DISTAL FEMUR FRACTURE;  Surgeon: Kennedy Bucker, MD;  Location: [facility] ORS;  Service: Orthopedics;  Laterality: Right;   REDUCTION MAMMAPLASTY Bilateral YRS AGO   REPLACEMENT TOTAL KNEE Bilateral    rotator cuff surgery  blilateral   TONSILECTOMY/ADENOIDECTOMY WITH MYRINGOTOMY     VISCERAL ARTERY INTERVENTION N/A 02/25/2019   Procedure: VISCERAL ARTERY INTERVENTION;  Surgeon: Annice Needy, MD;  Location: [facility] INVASIVE CV LAB;  Service: Cardiovascular;  Laterality: N/A;   Family History  Problem Relation Age of Onset   Heart disease Mother    Stroke Mother    Hypertension Mother    Heart disease Father        myocardial infarction age 38   Breast cancer Neg Hx    Social History   Socioeconomic History   Marital status: Widowed    Spouse name: Not on file   Number of children: 2   Years of education: 43   Highest education level: Associate degree: occupational,  Scientist, product/process development, or vocational program  Occupational History    Comment: Engineer, site  Tobacco Use   Smoking status: Never   Smokeless tobacco: Never  Vaping Use   Vaping status: Never Used  Substance and Sexual Activity   Alcohol use: No    Alcohol/week: 0.0 standard drinks of alcohol   Drug use: Never   Sexual activity: Not Currently  Other Topics Concern   Not on file  Social History Narrative   Not on file   Social Determinants of Health   Financial Resource Strain: Low Risk  (02/13/2023)   Overall Financial Resource Strain (CARDIA)    Difficulty of Paying Living Expenses: Not very hard  Food Insecurity: No Food Insecurity (02/26/2023)   Hunger Vital Sign    Worried About Running Out of Food in the Last Year: Never true    Ran Out of Food in the Last Year: Never true  Transportation Needs: No Transportation Needs (02/26/2023)   PRAPARE - Administrator, Civil Service (Medical): No    Lack of Transportation (Non-Medical): No  Physical Activity: Inactive (02/13/2023)   Exercise Vital Sign    Days of Exercise per Week: 0 days    Minutes of Exercise per Session: 10 min  Stress: No Stress Concern Present (02/13/2023)   [employer] of Occupational Health - Occupational Stress Questionnaire    Feeling of Stress : Only a little  Social Connections: Moderately Isolated (02/13/2023)   Social Connection and Isolation Panel [NHANES]    Frequency of Communication with Friends and Family: More than three times a week    Frequency of Social Gatherings with Friends and Family: Once a week    Attends Religious Services: 1 to 4 times per year    Active Member of [employer] or Organizations: No    Attends Banker Meetings: Never    Marital Status: Widowed     Review of Systems  Constitutional:  Negative for appetite change and unexpected weight change.  HENT:  Negative for congestion and sinus pressure.   Respiratory:  Negative for cough, chest tightness and  shortness of breath.   Cardiovascular:  Negative for chest pain and palpitations.  Gastrointestinal:  Negative for abdominal pain, diarrhea, nausea and vomiting.  Genitourinary:  Negative for difficulty urinating and dysuria.  Musculoskeletal:  Positive for neck pain.       Shoulder pain.   Skin:  Negative for color change and rash.  Neurological:  Negative for dizziness and headaches.  Psychiatric/Behavioral:  Negative for agitation and dysphoric mood.        Objective:     BP 136/70   Pulse 78   Temp 97.9 F (36.6 C)   Resp 16   Ht 5\' 2"  (  1.575 m)   Wt 177 lb (80.3 kg)   SpO2 96%   BMI 32.37 kg/m  Wt Readings from Last 3 Encounters:  05/19/23 177 lb (80.3 kg)  05/14/23 172 lb (78 kg)  03/11/23 172 lb (78 kg)    Physical Exam Vitals reviewed.  Constitutional:      General: She is not in acute distress.    Appearance: Normal appearance.  HENT:     Head: Normocephalic and atraumatic.     Right Ear: External ear normal.     Left Ear: External ear normal.  Eyes:     General: No scleral icterus.       Right eye: No discharge.        Left eye: No discharge.     Conjunctiva/sclera: Conjunctivae normal.  Neck:     Thyroid: No thyromegaly.  Cardiovascular:     Rate and Rhythm: Normal rate and regular rhythm.  Pulmonary:     Effort: No respiratory distress.     Breath sounds: Normal breath sounds. No wheezing.  Abdominal:     General: Bowel sounds are normal.     Palpations: Abdomen is soft.     Tenderness: There is no abdominal tenderness.  Musculoskeletal:        General: No swelling.     Cervical back: Neck supple. No tenderness.     Comments: Limited rom - neck, shoulder. Increased pain. Kyphotic.   Lymphadenopathy:     Cervical: No cervical adenopathy.  Skin:    Findings: No erythema or rash.  Neurological:     Mental Status: She is alert.  Psychiatric:        Mood and Affect: Mood normal.        Behavior: Behavior normal.      Outpatient Encounter  Medications as of 05/19/2023  Medication Sig   albuterol (VENTOLIN HFA) 108 (90 Base) MCG/ACT inhaler INHALE 2 PUFFS INTO THE LUNGS EVERY 6 HOURS AS NEEDED FOR WHEEZING OR SHORTNESS OF BREATH   aluminum-magnesium hydroxide-simethicone (MAALOX) 200-200-20 MG/5ML SUSP Take 15 mLs by mouth 4 (four) times daily -  before meals and at bedtime.    amLODipine (NORVASC) 10 MG tablet TAKE 1 TABLET BY MOUTH DAILY   aspirin 81 MG EC tablet Take 81 mg by mouth daily. Every other day   atorvastatin (LIPITOR) 20 MG tablet TAKE 1 TABLET BY MOUTH AT BEDTIME   benazepril (LOTENSIN) 40 MG tablet TAKE 1 TABLET BY MOUTH DAILY   betamethasone dipropionate 0.05 % cream Apply 1 application topically 2 (two) times daily.   budesonide-formoterol (SYMBICORT) 80-4.5 MCG/ACT inhaler Inhale 2 puffs into the lungs in the morning and at bedtime.   busPIRone (BUSPAR) 10 MG tablet Take 10 mg by mouth 3 (three) times daily.   calcium citrate-vitamin D (CITRACAL+D) 315-200 MG-UNIT tablet Take 2 tablets by mouth daily.   Cholecalciferol (VITAMIN D3) 1000 UNITS CAPS Take 1 capsule by mouth daily.   clobetasol cream (TEMOVATE) 0.05 % Apply 1 application topically See admin instructions. Apply to affected areas of body 1 - 2 times daily as needed for itchy bumps. Avoid applying to face, groin, and axilla. Use as directed.   cyanocobalamin (VITAMIN B12) 1000 MCG/ML injection Inject 1 mL (1,000 mcg total) into the muscle every 3 (three) months.   Diclofenac Sodium 3 % GEL Apply topically 2 (two) times daily.   Docusate Sodium (DSS) 100 MG CAPS Take 100 mg by mouth 2 (two) times daily.   DULoxetine (CYMBALTA) 60 MG capsule  Take 60 mg by mouth daily.   Fluocinolone Acetonide 0.01 % OIL Apply 1-2 drops into ears once to twice daily as needed.   fluocinonide (LIDEX) 0.05 % external solution Apply topically 2 (two) times daily. Apply drops to scalp for itch   fluticasone (FLONASE) 50 MCG/ACT nasal spray Place 2 sprays into both nostrils  daily.   furosemide (LASIX) 20 MG tablet TAKE 1 TABLET BY MOUTH DAILY AS NEEDED   gabapentin (NEURONTIN) 300 MG capsule Take two tablets tid (in am, midday and q hs).   HYDROcodone-acetaminophen (NORCO) 10-325 MG tablet Take 1 tablet by mouth 3 (three) times daily.   Infant Care Products [facility]) OINT Apply topically as directed (as discussed).   ipratropium (ATROVENT) 0.03 % nasal spray Place 2 sprays into both nostrils every 12 (twelve) hours.   ketoconazole (NIZORAL) 2 % shampoo Apply 1 Application topically See admin instructions. apply three times per week, massage into scalp and leave in for 10 minutes before rinsing out   levalbuterol (XOPENEX) 0.63 MG/3ML nebulizer solution Take 3 mLs (0.63 mg total) by nebulization every 6 (six) hours as needed for wheezing or shortness of breath.   LINZESS 145 MCG CAPS capsule Take 145 mcg by mouth daily.   magnesium oxide (MAG-OX) 400 MG tablet Take 1 tablet (400 mg total) by mouth daily.   metoprolol succinate (TOPROL-XL) 50 MG 24 hr tablet TAKE 1 TABLET BY MOUTH DAILY. TAKE WITH OR IMMEDIATELY FOLLOWING A MEAL.   morphine (MS CONTIN) 30 MG 12 hr tablet Take 30 mg by mouth every 12 (twelve) hours.   morphine (MS CONTIN) 60 MG 12 hr tablet Take 1 tablet (60 mg total) by mouth 2 (two) times daily.   mupirocin ointment (BACTROBAN) 2 % Apply 1 Application topically 2 (two) times daily.   nystatin (MYCOSTATIN/NYSTOP) powder Apply 1 application. topically 2 (two) times daily as needed.   nystatin cream (MYCOSTATIN) Apply 1 Application topically 2 (two) times daily.   polyethylene glycol powder (GLYCOLAX/MIRALAX) 17 GM/SCOOP powder MIX 17 GRAMS AS MARKED ON BOTTLE TOP IN 8 OUNCES OF WATER AND DRINK ONCE A DAY AS DIRECTED.   RABEprazole (ACIPHEX) 20 MG tablet TAKE 1 TABLET BY MOUTH TWICE A DAY BEFORE A MEAL   Simethicone (GAS-X PO) Take 2 tablets by mouth 2 (two) times daily.   sodium chloride (OCEAN) 0.65 % nasal spray Place 1 spray into the nose as  needed.   SYNTHROID 100 MCG tablet TAKE 1 TABLET BY MOUTH DAILY   triamcinolone ointment (KENALOG) 0.1 % APPLY TWICE DAILY TO BITES AND RASH UNTIL FLAT AND SMOOTH **DO NOT APPLY TO FACE**   No facility-administered encounter medications on file as of 05/19/2023.     Lab Results  Component Value Date   WBC 5.6 02/12/2023   HGB 10.5 (L) 02/12/2023   HCT 32.0 (L) 02/12/2023   PLT 133 (L) 02/12/2023   GLUCOSE 75 05/19/2023   CHOL 125 05/19/2023   TRIG 67.0 05/19/2023   HDL 54.00 05/19/2023   LDLCALC 58 05/19/2023   ALT 9 05/19/2023   AST 18 05/19/2023   NA 141 05/19/2023   K 4.5 05/19/2023   CL 102 05/19/2023   CREATININE 0.78 05/19/2023   BUN 16 05/19/2023   CO2 31 05/19/2023   TSH 1.45 05/19/2023   INR 0.89 08/06/2018   HGBA1C 6.0 10/19/2014    DG ESOPHAGUS W SINGLE CM (SOL OR THIN BA)  Result Date: 05/07/2023 CLINICAL DATA:  Dysphagia. Patient reports difficulty swallowing "every once in  a while," worse with liquids. Patient also reports severe heartburn. EXAM: ESOPHAGUS/BARIUM SWALLOW/TABLET STUDY TECHNIQUE: Single contrast examination was performed using thin liquid barium. This exam was performed by Mina Marble, PA-C , and was supervised and interpreted by Dr. Leanna Battles. A limited exam was conducted today due to significantly limited patient mobility which limited patient to LPO and AP views. FLUOROSCOPY: Radiation Exposure Index (as provided by the fluoroscopic device): 17.70 mGy Kerma COMPARISON:  DG UGI W/O KUB 10/03/2015 FINDINGS: Swallowing: Not assessed due to patient condition. Pharynx: Not assessed due to patient condition. Esophagus: Limited assessment due to patient condition. Esophageal motility: Severe esophageal dysmotility visualized with stasis of contrast during single swallow evaluation. Hiatal Hernia: None visualized. Gastroesophageal reflux: Small volume gastroesophageal reflux visualized. Ingested 13mm barium tablet: Not given. Other: Severe kyphosis with  the patient's head obscuring the neck and superior mediastinum. IMPRESSION: 1. Limited examination performed due to patient condition. 2. Esophageal dysmotility. 3. Small volume gastroesophageal reflux. Electronically Signed   By: Leanna Battles M.D.   On: 05/07/2023 10:33       Assessment & Plan:  Chronic diastolic CHF (congestive heart failure) (HCC) Assessment & Plan: Euvolemic clinically.  Continue benazepril, metoprolol and lasix.   Orders: -     SARS-CoV-2 Semi-Quantitative Total Antibody, Spike  Postinfectious hypothyroidism Assessment & Plan: On thyroid replacement.  Follow tsh.    Primary hypertension Assessment & Plan: Blood pressure as outlined.  Continue benazepril, amlodipine, lasix and metoprolol.  Follow pressures.  Follow metabolic panel.   Orders: -     Hepatic function panel  Hyperlipidemia, unspecified hyperlipidemia type Assessment & Plan: On lipitor.  Low cholesterol diet and exercise.  Follow lipid panel and liver function tests.    Orders: -     TSH -     Lipid panel -     Basic metabolic panel  Bilateral shoulder pain, unspecified chronicity Assessment & Plan: Seeing ortho.  S/p injection.  F/u with pain clinic tomorrow.   Orders: -     Sedimentation rate  Anemia, unspecified type Assessment & Plan: Followed by Dr Donneta Romberg. - f/u anemia. Hgb 10.5. continue B12 injections q 3 months.    Aortic atherosclerosis (HCC) Assessment & Plan: Continue lipitor.    Aortic valve stenosis, etiology of cardiac valve disease unspecified Assessment & Plan: Followed by cardiology.     Mild intermittent asthma without complication Assessment & Plan: F/u pulmonary 05/2023 - continue symbicort.  Breathing stable.    Chronic low back pain with sciatica, sciatica laterality unspecified, unspecified back pain laterality Assessment & Plan: Chronic issue.  Limits activity.  Seeing ortho.  Outpatient PT.    Chronic respiratory failure with hypoxia  (HCC) Assessment & Plan: Uses oxygen at night.  Continue oxygen - nocturnal.       Dysphagia, unspecified type Assessment & Plan: Recent barium swallow/UGI - Limited examination performed due to patient condition. Esophageal dysmotility. Small volume gastroesophageal reflux. I had contacted GI after the study for f/u.  She reports swallowing stable.  Will notify me if desires any further intervention or evaluation.    Stress Assessment & Plan: Followed by psychiatry.  Appears to be stable.  Follow.    Pulmonary hypertension (HCC) Assessment & Plan: Followed by pulmonary.  Continue nighttime oxygen.    Neck pain Assessment & Plan: Neck and shoulder pain as outlined.  Has seen ortho.  Some limitations - using her walker, ADLs, etc.  Continue outpatient PT.       Dale [place], MD

## 2023-05-20 ENCOUNTER — Ambulatory Visit: Payer: Medicare HMO

## 2023-05-20 DIAGNOSIS — Z79899 Other long term (current) drug therapy: Secondary | ICD-10-CM | POA: Diagnosis not present

## 2023-05-20 DIAGNOSIS — S76312A Strain of muscle, fascia and tendon of the posterior muscle group at thigh level, left thigh, initial encounter: Secondary | ICD-10-CM | POA: Diagnosis not present

## 2023-05-20 DIAGNOSIS — M19012 Primary osteoarthritis, left shoulder: Secondary | ICD-10-CM | POA: Diagnosis not present

## 2023-05-20 DIAGNOSIS — M519 Unspecified thoracic, thoracolumbar and lumbosacral intervertebral disc disorder: Secondary | ICD-10-CM | POA: Diagnosis not present

## 2023-05-20 DIAGNOSIS — M19011 Primary osteoarthritis, right shoulder: Secondary | ICD-10-CM | POA: Diagnosis not present

## 2023-05-20 DIAGNOSIS — K5903 Drug induced constipation: Secondary | ICD-10-CM | POA: Diagnosis not present

## 2023-05-20 DIAGNOSIS — M47812 Spondylosis without myelopathy or radiculopathy, cervical region: Secondary | ICD-10-CM | POA: Diagnosis not present

## 2023-05-20 DIAGNOSIS — Q675 Congenital deformity of spine: Secondary | ICD-10-CM | POA: Diagnosis not present

## 2023-05-20 DIAGNOSIS — M48061 Spinal stenosis, lumbar region without neurogenic claudication: Secondary | ICD-10-CM | POA: Diagnosis not present

## 2023-05-21 DIAGNOSIS — H6061 Unspecified chronic otitis externa, right ear: Secondary | ICD-10-CM | POA: Diagnosis not present

## 2023-05-23 ENCOUNTER — Ambulatory Visit: Payer: Medicare HMO

## 2023-05-23 DIAGNOSIS — R293 Abnormal posture: Secondary | ICD-10-CM

## 2023-05-23 DIAGNOSIS — M6281 Muscle weakness (generalized): Secondary | ICD-10-CM | POA: Diagnosis not present

## 2023-05-23 DIAGNOSIS — M542 Cervicalgia: Secondary | ICD-10-CM

## 2023-05-23 NOTE — Therapy (Addendum)
OUTPATIENT PHYSICAL THERAPY CERVICAL TREATMENT   Patient Name: Kimberly Roy MRN: [id number] DOB:22-Aug-1931, 88 y.o., female Today's Date: 05/23/2023  END OF SESSION:  PT End of Session - 05/23/23 1107     Visit Number 3    Number of Visits 13    Date for PT Re-Evaluation 06/03/23    PT Start Time 1110    PT Stop Time 1154    PT Time Calculation (min) 44 min    Activity Tolerance Patient tolerated treatment well    Behavior During Therapy WFL for tasks assessed/performed              Past Medical History:  Diagnosis Date   Anemia    Anxiety    Chest pain    CHF (congestive heart failure) (HCC)    Constipation    DDD (degenerative disc disease), cervical    Depression    DVT (deep venous thrombosis) (HCC)    Dysphonia    Dyspnea    Fatty liver    Fatty liver    Headache    Hyperlipidemia    Hyperpiesia    Hypertension    Hypothyroidism    Interstitial cystitis    Left ventricular dysfunction    Lymphedema    Nephrolithiasis    Obstructive sleep apnea    Osteoarthritis    knees/cervical and lumbar spine   Pulmonary hypertension (HCC)    Pulmonary nodules    followed by Dr Meredeth Ide   Pure hypercholesterolemia    Renal cyst    right   Past Surgical History:  Procedure Laterality Date   ABDOMINAL HYSTERECTOMY     ovaries left in place   APPENDECTOMY     Back Surgeries     BACK SURGERY     BREAST REDUCTION SURGERY     3/99   CARDIAC CATHETERIZATION     cataracts Bilateral    CERVICAL SPINE SURGERY     ESOPHAGEAL MANOMETRY N/A 08/02/2015   Procedure: ESOPHAGEAL MANOMETRY (EM);  Surgeon: Elnita Maxwell, MD;  Location: [facility] ENDOSCOPY;  Service: Endoscopy;  Laterality: N/A;   ESOPHAGOGASTRODUODENOSCOPY N/A 02/27/2015   Procedure: ESOPHAGOGASTRODUODENOSCOPY (EGD);  Surgeon: Wallace Cullens, MD;  Location: [facility] ENDOSCOPY;  Service: Gastroenterology;  Laterality: N/A;   ESOPHAGOGASTRODUODENOSCOPY (EGD) WITH PROPOFOL N/A 10/30/2018   Procedure:  ESOPHAGOGASTRODUODENOSCOPY (EGD) WITH PROPOFOL;  Surgeon: Christena Deem, MD;  Location: [facility] ENDOSCOPY;  Service: Endoscopy;  Laterality: N/A;   EXCISIONAL HEMORRHOIDECTOMY     EYE SURGERY     FRACTURE SURGERY     HEMORRHOID SURGERY     HIP SURGERY  2013   Right hip surgery   JOINT REPLACEMENT     KNEE ARTHROSCOPY     left and right   ORIF FEMUR FRACTURE Right 08/07/2018   Procedure: OPEN REDUCTION INTERNAL FIXATION (ORIF) DISTAL FEMUR FRACTURE;  Surgeon: Kennedy Bucker, MD;  Location: [facility] ORS;  Service: Orthopedics;  Laterality: Right;   REDUCTION MAMMAPLASTY Bilateral YRS AGO   REPLACEMENT TOTAL KNEE Bilateral    rotator cuff surgery     blilateral   TONSILECTOMY/ADENOIDECTOMY WITH MYRINGOTOMY     VISCERAL ARTERY INTERVENTION N/A 02/25/2019   Procedure: VISCERAL ARTERY INTERVENTION;  Surgeon: Annice Needy, MD;  Location: [facility] INVASIVE CV LAB;  Service: Cardiovascular;  Laterality: N/A;   Patient Active Problem List   Diagnosis Date Noted   Asthma 05/15/2023   Fall 03/15/2023   Difficulty swallowing 02/13/2023   Bartholin cyst 02/11/2023   Abnormal CT of the abdomen 10/27/2022  Trochanteric bursitis, right hip 10/10/2022   Earlobe lesion, right 09/12/2022   Pneumonia 07/12/2022   Hypertensive urgency 07/12/2022   Aortic stenosis 07/12/2022   Soft tissue injury 07/12/2022   Anemia 04/13/2022   B12 deficiency 04/04/2022   Interstitial cystitis 03/28/2022   Iron deficiency 02/28/2022   Right foot pain 01/29/2022   SOB (shortness of breath) 10/14/2021   Rash 03/03/2021   Skin lesion 03/03/2021   Aortic atherosclerosis (HCC) 11/20/2020   Bradycardia 06/08/2020   Itching 04/08/2020   Dyspnea 01/17/2020   Chronic respiratory failure with hypoxia (HCC) 01/17/2020   Right hip pain 01/10/2020   Leukocytosis 11/15/2019   Wound of buttock 10/25/2019   COVID-19 virus infection 10/04/2019   Urinary frequency 09/26/2019   Cold feeling 09/12/2019   Knee pain 07/04/2019    Hemoptysis 06/30/2019   Chronic diastolic CHF (congestive heart failure) (HCC) 08/10/2018   Femur fracture (HCC) 08/06/2018   Primary osteoarthritis of right shoulder 08/15/2017   Shoulder pain 05/16/2017   Chronic venous insufficiency 02/10/2017   Lymphedema 02/10/2017   Neuropathy 12/25/2016   Anemia due to blood loss, chronic 08/07/2016   GERD (gastroesophageal reflux disease) 12/10/2015   Finger pain 11/13/2015   Carotid artery calcification 09/26/2015   External nasal lesion 09/25/2015   Muscle cramps 09/01/2015   Excessive sweating 07/30/2015   Headache 07/16/2015   Groin pain 05/14/2015   Muscle twitching 03/05/2015   Leg cramps 02/11/2015   Back pain 02/11/2015   Abdominal pain 02/11/2015   Chronic pain 01/19/2015   Acute cystitis without hematuria 12/12/2014   Left elbow pain 11/30/2014   Health care maintenance 11/30/2014   Osteoporosis 10/19/2014   Rectal bleeding 10/19/2014   Neck pain 09/03/2014   Unsteady gait 09/03/2014   Nocturia 09/03/2014   Dysphagia 06/01/2014   Nasal dryness 06/01/2014   Stress 06/01/2014   Fatigue 02/27/2014   Degenerative disc disease 12/21/2013   Pre-op evaluation 10/12/2013   Hoarseness 08/14/2013   Leg swelling 01/24/2013   CHF (congestive heart failure) (HCC) 12/29/2012   Cough 12/29/2012   OSA (obstructive sleep apnea) 07/09/2012   Osteoarthritis 07/04/2012   Symptomatic anemia 07/04/2012   Chronic constipation 07/04/2012   Pulmonary hypertension (HCC) 07/04/2012   Pulmonary nodules 07/04/2012   Hypertension 07/04/2012   Hyperlipidemia 07/04/2012   Hypothyroidism 07/04/2012    PCP: Dale Deschutes, MD  REFERRING PROVIDER: Dale Kearny, MD  REFERRING DIAG: M54.2 (ICD-10-CM) - Neck pain  THERAPY DIAG:  Cervicalgia  Muscle weakness (generalized)  Abnormal posture  Rationale for Evaluation and Treatment: Rehabilitation  ONSET DATE: ~9 months ago  SUBJECTIVE:  SUBJECTIVE STATEMENT:  Pt reports 6/10 NPS in bilat shoulders. Pt notes some improvements in pain after visiting the pain clinic.   Hand dominance: Right  PERTINENT HISTORY:  Pt reports having chronic cervical pain for many years. History of cervical surgery in her 30's per pt reports. Pain has worsened gradually over the last several months. Referred pain down her LUE at mid deltoid. Reports pain as dull and achey. Reports having trouble using her LUE on her rollator in weightbearing and L shoulder pain with overhead activities. Denies N/T into her LUE or L hand weakness. Pt does have very kyphotic posture and cervical flexion with difficulty holding her head in a neutral position during subjective reports. Worst pain up to 9/10 NPS, best pain 4-5/10 NPS. Pain currently 6/10 NPS which she reports is her average.   PAIN:  Are you having pain? Yes: NPRS scale: 6/10 Pain location: L upper trap to top of L shoulder  Pain description: Dull and achey Aggravating factors: WB on rollator, overhead use with LUE.  Relieving factors: medications  PRECAUTIONS: None  RED FLAGS: Bowel or bladder incontinence: No, Spinal tumors: No, and Cauda equina syndrome: No     WEIGHT BEARING RESTRICTIONS: No  FALLS:  Has patient fallen in last 6 months? No  LIVING ENVIRONMENT: Lives with: lives with their son Lives in: House/apartment Has following equipment at home: Single point cane, Environmental consultant - 4 wheeled, Tour manager, and bed side commode  OCCUPATION: Retired  PLOF: Independent with transfers, Requires assistive device for independence, Needs assistance with ADLs, and Needs assistance with homemaking  PATIENT GOALS: Improve her pain   NEXT MD VISIT: September 2024  OBJECTIVE:   DIAGNOSTIC FINDINGS:  CLINICAL DATA:  Neck trauma (Age >= 65y)    EXAM: CT CERVICAL SPINE WITHOUT CONTRAST   TECHNIQUE: Multidetector CT imaging of the cervical spine was performed without intravenous contrast. Multiplanar CT image reconstructions were also generated.   RADIATION DOSE REDUCTION: This exam was performed according to the departmental dose-optimization program which includes automated exposure control, adjustment of the mA and/or kV according to patient size and/or use of iterative reconstruction technique.   COMPARISON:  08/18/2015   FINDINGS: Alignment: Facet joints are aligned without dislocation or traumatic listhesis. Dens and lateral masses are aligned. New grade 1 anterolisthesis of C3 on C4 secondary to progressive degenerative facet arthropathy.   Skull base and vertebrae: No acute fracture. No primary bone lesion or focal pathologic process. Congenital nonunion of the posterior arch of C1.   Soft tissues and spinal canal: No prevertebral fluid or swelling. No visible canal hematoma.   Disc levels: Advanced multilevel degenerative disc disease and facet arthropathy throughout the cervical spine, progressed since 2016. Prominent degenerative pannus formation posterior to the odontoid process resulting in canal narrowing at the C1-C2 level. Multiple additional levels of foraminal and canal stenosis, not well assessed by CT.   Upper chest: Mosaic attenuation of the included lung apices.   Other: Bilateral carotid atherosclerosis.   IMPRESSION: 1. No acute fracture or traumatic listhesis of the cervical spine. 2. Advanced multilevel degenerative disc disease and facet arthropathy throughout the cervical spine, progressed since 2016. 3. Mosaic attenuation of the included lung apices, which can be seen in the setting of small airways disease.     Electronically Signed   By: Duanne Guess D.O.   On: 02/22/2023 10:32  PATIENT SURVEYS:  FOTO 40/48  COGNITION: Overall cognitive status: Within functional limits  for tasks assessed  SENSATION: Light touch:  WFL  POSTURE: rounded shoulders, forward head, decreased lumbar lordosis, increased thoracic kyphosis, and flexed trunk   PALPATION: TTP along L cervical paraspinals and upper trap. Painful to palpate at L biceps tendon.TTP also on bilat pec major.   CERVICAL ROM:   Active ROM A/PROM (deg) eval  Flexion 60  Extension 20*  Right lateral flexion Unable  Left lateral flexion Unable  Right rotation 40  Left rotation 42   (Blank rows = not tested)  UPPER EXTREMITY ROM:  Active ROM Right eval Left eval  Shoulder flexion 124 122*  Shoulder extension    Shoulder abduction    Shoulder adduction    Shoulder extension    Shoulder internal rotation    Shoulder external rotation    Elbow flexion WNL WNL  Elbow extension WNL WNL  Wrist flexion    Wrist extension    Wrist ulnar deviation    Wrist radial deviation    Wrist pronation    Wrist supination     (Blank rows = not tested)  Shoulder ER, flexion, abduction to occiput  Shoulder extension, IR, adduction to T8/bra strap. Painful on L shoulder.   UPPER EXTREMITY MMT:  MMT Right eval Left eval  Shoulder flexion 4* 4*  Shoulder extension    Shoulder abduction 5* 5*  Shoulder adduction    Shoulder extension    Shoulder internal rotation    Shoulder external rotation    Middle trapezius    Lower trapezius    Elbow flexion 4 4*  Elbow extension 5 5  Wrist flexion 5 5  Wrist extension 5 5  Wrist ulnar deviation    Wrist radial deviation    Wrist pronation    Wrist supination    Grip strength Symmetrical Symmetrical   (Blank rows = not tested)  CERVICAL/SHOULDER SPECIAL TESTS:  Open can: positive on LUE     FUNCTIONAL TESTS:  Pt with significant cervical flexor contracture with attempts to lie supine. Unable to tolerate even inclined due to pt quickly reporting dizziness. Thus unable to assess further shoulder joint mobility, PROM  TODAY'S TREATMENT: DATE:  05/23/23  TherEx:  Semi-fowlers position for comfort level against tilted table: Shoulder flexion to end range utilizing PVC pipe, 1 x10, w/ 1# AW 2 x6 Shoulder horizontal abduction with RTB, 2x10 Bicep curls with 2# DB in bilat UE's 2x10 Shoulder ER RUE/ LUE with redTB, 2x10 Shoulder rolls into posterior rotation, 2x10 Cone taps (2) dynamic reaching across midline x8, dynamic reaching across midline w/ 1# DB in bilat UE 's 2 x8 Seated w/ back unsupported:   Seated cervical extension to end range, x15  Manual Therapy: Seated STM to B biceps and anterior/ middle deltoid ~5 minutes (pt expressing increased pain in bilat UE 's following exercises, and expressed relief following STM)   PATIENT EDUCATION:  Education details: Prognosis, HEP, POC Person educated: Patient, Child(ren), and Caregiver   Education method: Explanation, Demonstration, Tactile cues, Verbal cues, and Handouts Education comprehension: verbalized understanding, returned demonstration, verbal cues required, tactile cues required, and needs further education  HOME EXERCISE PROGRAM: Access Code: [email or web address] URL: [email or web address] Date: 05/13/2023 Prepared by: Ronnie Derby & Tomasa Hose  Exercises - Seated Scapular Protraction and Retraction with Dowel  - 1 x daily - 7 x weekly - 3 sets - 8 reps - Seated Shoulder Flexion AAROM with Dowel  - 1 x daily - 7 x weekly - 3 sets - 8 reps - Seated Cervical Extension AROM  - 1 x daily -  7 x weekly - 3 sets - 8 reps - Shoulder Rolls in Sitting  - 1 x daily - 7 x weekly - 3 sets - 10 reps - Supine Bicep Curl with Cane  - 1 x daily - 7 x weekly - 3 sets - 10 reps  ASSESSMENT:  CLINICAL IMPRESSION: Session focused on progressing thoracic mobility and bilat shoulder strengthening exercises. Pt notes improvements in bilat shoulder strengthening noted w/ increased activity tolerance at today's session, however noted increased muscular pain following exercises with  some pain relief from STM at bilat shoulders/ biceps. Pt continues to display decreased thoracic extension mobility presenting with severe thoracic kyphosis, forward head posture and rounded shoulders. Pt will continue to benefit from skilled therapy to address remaining deficits in order to improve overall QoL and return to PLOF.     OBJECTIVE IMPAIRMENTS: Abnormal gait, decreased mobility, decreased ROM, decreased strength, hypomobility, impaired flexibility, impaired UE functional use, improper body mechanics, postural dysfunction, and pain.   ACTIVITY LIMITATIONS: carrying, lifting, standing, bathing, toileting, dressing, reach over head, and hygiene/grooming  PARTICIPATION LIMITATIONS:  LUE weightbearing on rollator with gait  PERSONAL FACTORS: Age, Past/current experiences, Time since onset of injury/illness/exacerbation, and 3+ comorbidities: Anemia, CHF, DDD, depression, HLD, pulmonary HTN  are also affecting patient's functional outcome.   REHAB POTENTIAL: Poor Chronicity of symptoms, age, postural and position limitations  CLINICAL DECISION MAKING: Evolving/moderate complexity  EVALUATION COMPLEXITY: Moderate   GOALS: Goals reviewed with patient? No  SHORT TERM GOALS: Target date: 05/13/23  Pt will be compliant with HEP with min to modA from caregiver to address L cervical and shoulder pain and improve posture/cervical mobility Baseline: 04/22/23: provided initial HEP Goal status: INITIAL   LONG TERM GOALS: Target date: 06/03/23  Pt will improve FOTO to target score to demonstrate clinically significant improvement in functional mobility. Baseline: 8/20: 40/48 Goal status: INITIAL  2.  Pt will report on average worst pain 4/10 or less to demonstrate clinically significant reduction in cervical pain.  Baseline: 04/22/23: 6/10 NPS on average worst pain Goal status: INITIAL  3.  Pt will improve cervical extension AROM by at least 5 degrees to assist in neutral head  positioning for decreased neck pain and flexor contracture  Baseline: 04/22/23: 20 degrees Goal status: INITIAL   PLAN:  PT FREQUENCY: 1-2x/week  PT DURATION: 6 weeks  PLANNED INTERVENTIONS: Therapeutic exercises, Therapeutic activity, Neuromuscular re-education, Balance training, Gait training, Patient/Family education, Self Care, Joint mobilization, Dry Needling, Spinal mobilization, Moist heat, Manual therapy, and Re-evaluation  PLAN FOR NEXT SESSION:  Postural strengthening, cervical extension, posture w/ ambulation training.  Lovie Macadamia, SPT  Delphia Grates. Fairly IV, PT, DPT Physical Therapist- [facility]  05/23/23, 12:26 PM

## 2023-05-24 ENCOUNTER — Encounter: Payer: Self-pay | Admitting: Internal Medicine

## 2023-05-24 NOTE — Assessment & Plan Note (Signed)
Followed by pulmonary.  Continue nighttime oxygen.

## 2023-05-24 NOTE — Assessment & Plan Note (Signed)
Followed by psychiatry.  Appears to be stable.  Follow.

## 2023-05-24 NOTE — Assessment & Plan Note (Signed)
Recent barium swallow/UGI - Limited examination performed due to patient condition. Esophageal dysmotility. Small volume gastroesophageal reflux. I had contacted GI after the study for f/u.  She reports swallowing stable.  Will notify me if desires any further intervention or evaluation.

## 2023-05-24 NOTE — Assessment & Plan Note (Signed)
F/u pulmonary 05/2023 - continue symbicort.  Breathing stable.

## 2023-05-24 NOTE — Assessment & Plan Note (Signed)
Followed by cardiology

## 2023-05-24 NOTE — Assessment & Plan Note (Signed)
On lipitor.  Low cholesterol diet and exercise.  Follow lipid panel and liver function tests.

## 2023-05-24 NOTE — Assessment & Plan Note (Signed)
Chronic issue.  Limits activity.  Seeing ortho.  Outpatient PT.

## 2023-05-24 NOTE — Assessment & Plan Note (Signed)
Uses oxygen at night.  Continue oxygen - nocturnal.

## 2023-05-24 NOTE — Assessment & Plan Note (Signed)
Neck and shoulder pain as outlined.  Has seen ortho.  Some limitations - using her walker, ADLs, etc.  Continue outpatient PT.

## 2023-05-24 NOTE — Assessment & Plan Note (Signed)
Continue lipitor  ?

## 2023-05-24 NOTE — Assessment & Plan Note (Signed)
Seeing ortho.  S/p injection.  F/u with pain clinic tomorrow.

## 2023-05-24 NOTE — Assessment & Plan Note (Signed)
Followed by Dr Donneta Romberg. - f/u anemia. Hgb 10.5. continue B12 injections q 3 months.

## 2023-05-24 NOTE — Assessment & Plan Note (Signed)
On thyroid replacement.  Follow tsh.  

## 2023-05-24 NOTE — Assessment & Plan Note (Signed)
Euvolemic clinically.  Continue benazepril, metoprolol and lasix.

## 2023-05-24 NOTE — Assessment & Plan Note (Signed)
Blood pressure as outlined.  Continue benazepril, amlodipine, lasix and metoprolol.  Follow pressures.  Follow metabolic panel.

## 2023-05-27 ENCOUNTER — Ambulatory Visit: Payer: Medicare HMO

## 2023-05-27 DIAGNOSIS — M6281 Muscle weakness (generalized): Secondary | ICD-10-CM

## 2023-05-27 DIAGNOSIS — R293 Abnormal posture: Secondary | ICD-10-CM

## 2023-05-27 DIAGNOSIS — M542 Cervicalgia: Secondary | ICD-10-CM

## 2023-05-27 NOTE — Therapy (Cosign Needed)
OUTPATIENT PHYSICAL THERAPY CERVICAL TREATMENT   Patient Name: Kimberly Roy MRN: [id number] DOB:April 25, 1931, 89 y.o., female Today's Date: 05/28/2023  END OF SESSION:  PT End of Session - 05/27/23 1302     Visit Number 4    Number of Visits 13    Date for PT Re-Evaluation 06/03/23    PT Start Time 1302    PT Stop Time 1345    PT Time Calculation (min) 43 min    Activity Tolerance Patient tolerated treatment well    Behavior During Therapy WFL for tasks assessed/performed              Past Medical History:  Diagnosis Date   Anemia    Anxiety    Chest pain    CHF (congestive heart failure) (HCC)    Constipation    DDD (degenerative disc disease), cervical    Depression    DVT (deep venous thrombosis) (HCC)    Dysphonia    Dyspnea    Fatty liver    Fatty liver    Headache    Hyperlipidemia    Hyperpiesia    Hypertension    Hypothyroidism    Interstitial cystitis    Left ventricular dysfunction    Lymphedema    Nephrolithiasis    Obstructive sleep apnea    Osteoarthritis    knees/cervical and lumbar spine   Pulmonary hypertension (HCC)    Pulmonary nodules    followed by Dr Meredeth Ide   Pure hypercholesterolemia    Renal cyst    right   Past Surgical History:  Procedure Laterality Date   ABDOMINAL HYSTERECTOMY     ovaries left in place   APPENDECTOMY     Back Surgeries     BACK SURGERY     BREAST REDUCTION SURGERY     3/99   CARDIAC CATHETERIZATION     cataracts Bilateral    CERVICAL SPINE SURGERY     ESOPHAGEAL MANOMETRY N/A 08/02/2015   Procedure: ESOPHAGEAL MANOMETRY (EM);  Surgeon: Elnita Maxwell, MD;  Location: [facility] ENDOSCOPY;  Service: Endoscopy;  Laterality: N/A;   ESOPHAGOGASTRODUODENOSCOPY N/A 02/27/2015   Procedure: ESOPHAGOGASTRODUODENOSCOPY (EGD);  Surgeon: Wallace Cullens, MD;  Location: [facility] ENDOSCOPY;  Service: Gastroenterology;  Laterality: N/A;   ESOPHAGOGASTRODUODENOSCOPY (EGD) WITH PROPOFOL N/A 10/30/2018   Procedure:  ESOPHAGOGASTRODUODENOSCOPY (EGD) WITH PROPOFOL;  Surgeon: Christena Deem, MD;  Location: [facility] ENDOSCOPY;  Service: Endoscopy;  Laterality: N/A;   EXCISIONAL HEMORRHOIDECTOMY     EYE SURGERY     FRACTURE SURGERY     HEMORRHOID SURGERY     HIP SURGERY  2013   Right hip surgery   JOINT REPLACEMENT     KNEE ARTHROSCOPY     left and right   ORIF FEMUR FRACTURE Right 08/07/2018   Procedure: OPEN REDUCTION INTERNAL FIXATION (ORIF) DISTAL FEMUR FRACTURE;  Surgeon: Kennedy Bucker, MD;  Location: [facility] ORS;  Service: Orthopedics;  Laterality: Right;   REDUCTION MAMMAPLASTY Bilateral YRS AGO   REPLACEMENT TOTAL KNEE Bilateral    rotator cuff surgery     blilateral   TONSILECTOMY/ADENOIDECTOMY WITH MYRINGOTOMY     VISCERAL ARTERY INTERVENTION N/A 02/25/2019   Procedure: VISCERAL ARTERY INTERVENTION;  Surgeon: Annice Needy, MD;  Location: [facility] INVASIVE CV LAB;  Service: Cardiovascular;  Laterality: N/A;   Patient Active Problem List   Diagnosis Date Noted   Asthma 05/15/2023   Fall 03/15/2023   Difficulty swallowing 02/13/2023   Bartholin cyst 02/11/2023   Abnormal CT of the abdomen 10/27/2022  Trochanteric bursitis, right hip 10/10/2022   Earlobe lesion, right 09/12/2022   Pneumonia 07/12/2022   Hypertensive urgency 07/12/2022   Aortic stenosis 07/12/2022   Soft tissue injury 07/12/2022   Anemia 04/13/2022   B12 deficiency 04/04/2022   Interstitial cystitis 03/28/2022   Iron deficiency 02/28/2022   Right foot pain 01/29/2022   SOB (shortness of breath) 10/14/2021   Rash 03/03/2021   Skin lesion 03/03/2021   Aortic atherosclerosis (HCC) 11/20/2020   Bradycardia 06/08/2020   Itching 04/08/2020   Dyspnea 01/17/2020   Chronic respiratory failure with hypoxia (HCC) 01/17/2020   Right hip pain 01/10/2020   Leukocytosis 11/15/2019   Wound of buttock 10/25/2019   COVID-19 virus infection 10/04/2019   Urinary frequency 09/26/2019   Cold feeling 09/12/2019   Knee pain 07/04/2019    Hemoptysis 06/30/2019   Chronic diastolic CHF (congestive heart failure) (HCC) 08/10/2018   Femur fracture (HCC) 08/06/2018   Primary osteoarthritis of right shoulder 08/15/2017   Shoulder pain 05/16/2017   Chronic venous insufficiency 02/10/2017   Lymphedema 02/10/2017   Neuropathy 12/25/2016   Anemia due to blood loss, chronic 08/07/2016   GERD (gastroesophageal reflux disease) 12/10/2015   Finger pain 11/13/2015   Carotid artery calcification 09/26/2015   External nasal lesion 09/25/2015   Muscle cramps 09/01/2015   Excessive sweating 07/30/2015   Headache 07/16/2015   Groin pain 05/14/2015   Muscle twitching 03/05/2015   Leg cramps 02/11/2015   Back pain 02/11/2015   Abdominal pain 02/11/2015   Chronic pain 01/19/2015   Acute cystitis without hematuria 12/12/2014   Left elbow pain 11/30/2014   Health care maintenance 11/30/2014   Osteoporosis 10/19/2014   Rectal bleeding 10/19/2014   Neck pain 09/03/2014   Unsteady gait 09/03/2014   Nocturia 09/03/2014   Dysphagia 06/01/2014   Nasal dryness 06/01/2014   Stress 06/01/2014   Fatigue 02/27/2014   Degenerative disc disease 12/21/2013   Pre-op evaluation 10/12/2013   Hoarseness 08/14/2013   Leg swelling 01/24/2013   CHF (congestive heart failure) (HCC) 12/29/2012   Cough 12/29/2012   OSA (obstructive sleep apnea) 07/09/2012   Osteoarthritis 07/04/2012   Symptomatic anemia 07/04/2012   Chronic constipation 07/04/2012   Pulmonary hypertension (HCC) 07/04/2012   Pulmonary nodules 07/04/2012   Hypertension 07/04/2012   Hyperlipidemia 07/04/2012   Hypothyroidism 07/04/2012    PCP: Dale Newcastle, MD  REFERRING PROVIDER: Dale Avoyelles, MD  REFERRING DIAG: M54.2 (ICD-10-CM) - Neck pain  THERAPY DIAG:  Cervicalgia  Muscle weakness (generalized)  Abnormal posture  Rationale for Evaluation and Treatment: Rehabilitation  ONSET DATE: ~9 months ago  SUBJECTIVE:  SUBJECTIVE STATEMENT:  Pt reports 8/10 NPS in bilat shoulders. Pt expresses soreness in bilat shoulders after last visit.   Hand dominance: Right  PERTINENT HISTORY:  Pt reports having chronic cervical pain for many years. History of cervical surgery in her 30's per pt reports. Pain has worsened gradually over the last several months. Referred pain down her LUE at mid deltoid. Reports pain as dull and achey. Reports having trouble using her LUE on her rollator in weightbearing and L shoulder pain with overhead activities. Denies N/T into her LUE or L hand weakness. Pt does have very kyphotic posture and cervical flexion with difficulty holding her head in a neutral position during subjective reports. Worst pain up to 9/10 NPS, best pain 4-5/10 NPS. Pain currently 6/10 NPS which she reports is her average.   PAIN:  Are you having pain? Yes: NPRS scale: 8/10 Pain location: L upper trap to top of L shoulder  Pain description: Dull and achey Aggravating factors: WB on rollator, overhead use with LUE.  Relieving factors: medications  PRECAUTIONS: None  RED FLAGS: Bowel or bladder incontinence: No, Spinal tumors: No, and Cauda equina syndrome: No     WEIGHT BEARING RESTRICTIONS: No  FALLS:  Has patient fallen in last 6 months? No  LIVING ENVIRONMENT: Lives with: lives with their son Lives in: House/apartment Has following equipment at home: Single point cane, Environmental consultant - 4 wheeled, Tour manager, and bed side commode  OCCUPATION: Retired  PLOF: Independent with transfers, Requires assistive device for independence, Needs assistance with ADLs, and Needs assistance with homemaking  PATIENT GOALS: Improve her pain   NEXT MD VISIT: September 2024  OBJECTIVE:   DIAGNOSTIC FINDINGS:  CLINICAL DATA:  Neck trauma (Age >= 65y)   EXAM: CT  CERVICAL SPINE WITHOUT CONTRAST   TECHNIQUE: Multidetector CT imaging of the cervical spine was performed without intravenous contrast. Multiplanar CT image reconstructions were also generated.   RADIATION DOSE REDUCTION: This exam was performed according to the departmental dose-optimization program which includes automated exposure control, adjustment of the mA and/or kV according to patient size and/or use of iterative reconstruction technique.   COMPARISON:  08/18/2015   FINDINGS: Alignment: Facet joints are aligned without dislocation or traumatic listhesis. Dens and lateral masses are aligned. New grade 1 anterolisthesis of C3 on C4 secondary to progressive degenerative facet arthropathy.   Skull base and vertebrae: No acute fracture. No primary bone lesion or focal pathologic process. Congenital nonunion of the posterior arch of C1.   Soft tissues and spinal canal: No prevertebral fluid or swelling. No visible canal hematoma.   Disc levels: Advanced multilevel degenerative disc disease and facet arthropathy throughout the cervical spine, progressed since 2016. Prominent degenerative pannus formation posterior to the odontoid process resulting in canal narrowing at the C1-C2 level. Multiple additional levels of foraminal and canal stenosis, not well assessed by CT.   Upper chest: Mosaic attenuation of the included lung apices.   Other: Bilateral carotid atherosclerosis.   IMPRESSION: 1. No acute fracture or traumatic listhesis of the cervical spine. 2. Advanced multilevel degenerative disc disease and facet arthropathy throughout the cervical spine, progressed since 2016. 3. Mosaic attenuation of the included lung apices, which can be seen in the setting of small airways disease.     Electronically Signed   By: Duanne Guess D.O.   On: 02/22/2023 10:32  PATIENT SURVEYS:  FOTO 40/48  COGNITION: Overall cognitive status: Within functional limits for tasks  assessed  SENSATION: Light touch: [facility]  POSTURE: rounded shoulders, forward head, decreased lumbar lordosis, increased thoracic kyphosis, and flexed trunk   PALPATION: TTP along L cervical paraspinals and upper trap. Painful to palpate at L biceps tendon.TTP also on bilat pec major.   CERVICAL ROM:   Active ROM A/PROM (deg) eval  Flexion 60  Extension 20*  Right lateral flexion Unable  Left lateral flexion Unable  Right rotation 40  Left rotation 42   (Blank rows = not tested)  UPPER EXTREMITY ROM:  Active ROM Right eval Left eval  Shoulder flexion 124 122*  Shoulder extension    Shoulder abduction    Shoulder adduction    Shoulder extension    Shoulder internal rotation    Shoulder external rotation    Elbow flexion WNL WNL  Elbow extension WNL WNL  Wrist flexion    Wrist extension    Wrist ulnar deviation    Wrist radial deviation    Wrist pronation    Wrist supination     (Blank rows = not tested)  Shoulder ER, flexion, abduction to occiput  Shoulder extension, IR, adduction to T8/bra strap. Painful on L shoulder.   UPPER EXTREMITY MMT:  MMT Right eval Left eval  Shoulder flexion 4* 4*  Shoulder extension    Shoulder abduction 5* 5*  Shoulder adduction    Shoulder extension    Shoulder internal rotation    Shoulder external rotation    Middle trapezius    Lower trapezius    Elbow flexion 4 4*  Elbow extension 5 5  Wrist flexion 5 5  Wrist extension 5 5  Wrist ulnar deviation    Wrist radial deviation    Wrist pronation    Wrist supination    Grip strength Symmetrical Symmetrical   (Blank rows = not tested)  CERVICAL/SHOULDER SPECIAL TESTS:  Open can: positive on LUE     FUNCTIONAL TESTS:  Pt with significant cervical flexor contracture with attempts to lie supine. Unable to tolerate even inclined due to pt quickly reporting dizziness. Thus unable to assess further shoulder joint mobility, PROM  TODAY'S TREATMENT: DATE:  05/28/23  TherEx:  Semi-fowlers position for comfort level against tilted table: w/ heat pack on bilat shoulders  LAQ RLE/LLE x10 over bolster, w/ 2# AW's 2 x10/ each side Shoulder flexion to end range utilizing PVC pipe, w/ 1# AW 2 x8 Shoulder horizontal abduction with RTB, 2x8 Bicep curls with 1# DB in bilat UE's 2 x10 Punches across midline w/ 1# DB RLE/LLE 2 x8  Shoulder rows w/ GTB 2 x10   STS from plinth emphasis on cervico- thoracic extension upon standing x8; SPV  PATIENT EDUCATION:  Education details: Prognosis, HEP, POC Person educated: Patient, Child(ren), and Caregiver   Education method: Explanation, Demonstration, Tactile cues, Verbal cues, and Handouts Education comprehension: verbalized understanding, returned demonstration, verbal cues required, tactile cues required, and needs further education  HOME EXERCISE PROGRAM: Access Code: [email or web address] URL: [email or web address] Date: 05/13/2023 Prepared by: Ronnie Derby & Tomasa Hose  Exercises - Seated Scapular Protraction and Retraction with Dowel  - 1 x daily - 7 x weekly - 3 sets - 8 reps - Seated Shoulder Flexion AAROM with Dowel  - 1 x daily - 7 x weekly - 3 sets - 8 reps - Seated Cervical Extension AROM  - 1 x daily - 7 x weekly - 3 sets - 8 reps - Shoulder Rolls in Sitting  - 1 x daily - 7 x weekly - 3 sets - 10 reps - Supine  Bicep Curl with Cane  - 1 x daily - 7 x weekly - 3 sets - 10 reps  ASSESSMENT:  CLINICAL IMPRESSION: Session focused on progressing thoracic mobility and bilat shoulder strengthening exercises. Pt notes improvements in bilat shoulder strengthening and cervicothoracic ext noted w/ increased activity tolerance at today's session specifically w/ correcting head position w/ STS activity. Pt does continue to display severe thoracic kyphosis, forward head posture and rounded shoulders when standing at rollator. Plan to incorporate leg strengthening tasks to reduce cervical and shoulder  pain as pain is likely being exacerbating in spine/shoulders due to significant WB'ing in Ue's on rollator during ambulation. Pt will continue to benefit from skilled therapy to address remaining deficits in order to improve overall QoL and return to PLOF.    OBJECTIVE IMPAIRMENTS: Abnormal gait, decreased mobility, decreased ROM, decreased strength, hypomobility, impaired flexibility, impaired UE functional use, improper body mechanics, postural dysfunction, and pain.   ACTIVITY LIMITATIONS: carrying, lifting, standing, bathing, toileting, dressing, reach over head, and hygiene/grooming  PARTICIPATION LIMITATIONS:  LUE weightbearing on rollator with gait  PERSONAL FACTORS: Age, Past/current experiences, Time since onset of injury/illness/exacerbation, and 3+ comorbidities: Anemia, CHF, DDD, depression, HLD, pulmonary HTN  are also affecting patient's functional outcome.   REHAB POTENTIAL: Poor Chronicity of symptoms, age, postural and position limitations  CLINICAL DECISION MAKING: Evolving/moderate complexity  EVALUATION COMPLEXITY: Moderate   GOALS: Goals reviewed with patient? No  SHORT TERM GOALS: Target date: 05/13/23  Pt will be compliant with HEP with min to modA from caregiver to address L cervical and shoulder pain and improve posture/cervical mobility Baseline: 04/22/23: provided initial HEP Goal status: INITIAL   LONG TERM GOALS: Target date: 06/03/23  Pt will improve FOTO to target score to demonstrate clinically significant improvement in functional mobility. Baseline: 8/20: 40/48 Goal status: INITIAL  2.  Pt will report on average worst pain 4/10 or less to demonstrate clinically significant reduction in cervical pain.  Baseline: 04/22/23: 6/10 NPS on average worst pain Goal status: INITIAL  3.  Pt will improve cervical extension AROM by at least 5 degrees to assist in neutral head positioning for decreased neck pain and flexor contracture  Baseline: 04/22/23: 20  degrees Goal status: INITIAL   PLAN:  PT FREQUENCY: 1-2x/week  PT DURATION: 6 weeks  PLANNED INTERVENTIONS: Therapeutic exercises, Therapeutic activity, Neuromuscular re-education, Balance training, Gait training, Patient/Family education, Self Care, Joint mobilization, Dry Needling, Spinal mobilization, Moist heat, Manual therapy, and Re-evaluation  PLAN FOR NEXT SESSION:  Postural strengthening/ cervical strengthening along w/ LE strengthening.   Lovie Macadamia, SPT  Delphia Grates. Fairly IV, PT, DPT Physical Therapist- [place]  [facility]  05/28/23, 8:16 AM

## 2023-05-29 ENCOUNTER — Ambulatory Visit: Payer: Medicare HMO

## 2023-06-02 DIAGNOSIS — M25512 Pain in left shoulder: Secondary | ICD-10-CM | POA: Diagnosis not present

## 2023-06-02 DIAGNOSIS — M25511 Pain in right shoulder: Secondary | ICD-10-CM | POA: Diagnosis not present

## 2023-06-03 ENCOUNTER — Ambulatory Visit: Payer: Medicare HMO

## 2023-06-05 ENCOUNTER — Other Ambulatory Visit: Payer: Self-pay | Admitting: Internal Medicine

## 2023-06-05 ENCOUNTER — Ambulatory Visit: Payer: Medicare HMO | Attending: Internal Medicine

## 2023-06-05 DIAGNOSIS — R293 Abnormal posture: Secondary | ICD-10-CM | POA: Diagnosis not present

## 2023-06-05 DIAGNOSIS — M6281 Muscle weakness (generalized): Secondary | ICD-10-CM | POA: Insufficient documentation

## 2023-06-05 DIAGNOSIS — M542 Cervicalgia: Secondary | ICD-10-CM | POA: Insufficient documentation

## 2023-06-05 NOTE — Therapy (Addendum)
OUTPATIENT PHYSICAL THERAPY CERVICAL TREATMENT/ RE- CERTIFICATION   Patient Name: Kimberly Roy MRN: [id number] DOB:Dec 30, 1930, 89 y.o., female Today's Date: 06/05/2023  END OF SESSION:  PT End of Session - 06/05/23 1251     Visit Number 5    Number of Visits 13    Date for PT Re-Evaluation 07/17/23    PT Start Time 1258    PT Stop Time 1341    PT Time Calculation (min) 43 min    Activity Tolerance Patient tolerated treatment well    Behavior During Therapy WFL for tasks assessed/performed              Past Medical History:  Diagnosis Date   Anemia    Anxiety    Chest pain    CHF (congestive heart failure) (HCC)    Constipation    DDD (degenerative disc disease), cervical    Depression    DVT (deep venous thrombosis) (HCC)    Dysphonia    Dyspnea    Fatty liver    Fatty liver    Headache    Hyperlipidemia    Hyperpiesia    Hypertension    Hypothyroidism    Interstitial cystitis    Left ventricular dysfunction    Lymphedema    Nephrolithiasis    Obstructive sleep apnea    Osteoarthritis    knees/cervical and lumbar spine   Pulmonary hypertension (HCC)    Pulmonary nodules    followed by Dr Meredeth Ide   Pure hypercholesterolemia    Renal cyst    right   Past Surgical History:  Procedure Laterality Date   ABDOMINAL HYSTERECTOMY     ovaries left in place   APPENDECTOMY     Back Surgeries     BACK SURGERY     BREAST REDUCTION SURGERY     3/99   CARDIAC CATHETERIZATION     cataracts Bilateral    CERVICAL SPINE SURGERY     ESOPHAGEAL MANOMETRY N/A 08/02/2015   Procedure: ESOPHAGEAL MANOMETRY (EM);  Surgeon: Elnita Maxwell, MD;  Location: [facility] ENDOSCOPY;  Service: Endoscopy;  Laterality: N/A;   ESOPHAGOGASTRODUODENOSCOPY N/A 02/27/2015   Procedure: ESOPHAGOGASTRODUODENOSCOPY (EGD);  Surgeon: Wallace Cullens, MD;  Location: [facility] ENDOSCOPY;  Service: Gastroenterology;  Laterality: N/A;   ESOPHAGOGASTRODUODENOSCOPY (EGD) WITH PROPOFOL N/A 10/30/2018    Procedure: ESOPHAGOGASTRODUODENOSCOPY (EGD) WITH PROPOFOL;  Surgeon: Christena Deem, MD;  Location: [facility] ENDOSCOPY;  Service: Endoscopy;  Laterality: N/A;   EXCISIONAL HEMORRHOIDECTOMY     EYE SURGERY     FRACTURE SURGERY     HEMORRHOID SURGERY     HIP SURGERY  2013   Right hip surgery   JOINT REPLACEMENT     KNEE ARTHROSCOPY     left and right   ORIF FEMUR FRACTURE Right 08/07/2018   Procedure: OPEN REDUCTION INTERNAL FIXATION (ORIF) DISTAL FEMUR FRACTURE;  Surgeon: Kennedy Bucker, MD;  Location: [facility] ORS;  Service: Orthopedics;  Laterality: Right;   REDUCTION MAMMAPLASTY Bilateral YRS AGO   REPLACEMENT TOTAL KNEE Bilateral    rotator cuff surgery     blilateral   TONSILECTOMY/ADENOIDECTOMY WITH MYRINGOTOMY     VISCERAL ARTERY INTERVENTION N/A 02/25/2019   Procedure: VISCERAL ARTERY INTERVENTION;  Surgeon: Annice Needy, MD;  Location: [facility] INVASIVE CV LAB;  Service: Cardiovascular;  Laterality: N/A;   Patient Active Problem List   Diagnosis Date Noted   Asthma 05/15/2023   Fall 03/15/2023   Difficulty swallowing 02/13/2023   Bartholin cyst 02/11/2023   Abnormal CT of the  abdomen 10/27/2022   Trochanteric bursitis, right hip 10/10/2022   Earlobe lesion, right 09/12/2022   Pneumonia 07/12/2022   Hypertensive urgency 07/12/2022   Aortic stenosis 07/12/2022   Soft tissue injury 07/12/2022   Anemia 04/13/2022   B12 deficiency 04/04/2022   Interstitial cystitis 03/28/2022   Iron deficiency 02/28/2022   Right foot pain 01/29/2022   SOB (shortness of breath) 10/14/2021   Rash 03/03/2021   Skin lesion 03/03/2021   Aortic atherosclerosis (HCC) 11/20/2020   Bradycardia 06/08/2020   Itching 04/08/2020   Dyspnea 01/17/2020   Chronic respiratory failure with hypoxia (HCC) 01/17/2020   Right hip pain 01/10/2020   Leukocytosis 11/15/2019   Wound of buttock 10/25/2019   COVID-19 virus infection 10/04/2019   Urinary frequency 09/26/2019   Cold feeling 09/12/2019   Knee pain  07/04/2019   Hemoptysis 06/30/2019   Chronic diastolic CHF (congestive heart failure) (HCC) 08/10/2018   Femur fracture (HCC) 08/06/2018   Primary osteoarthritis of right shoulder 08/15/2017   Shoulder pain 05/16/2017   Chronic venous insufficiency 02/10/2017   Lymphedema 02/10/2017   Neuropathy 12/25/2016   Anemia due to blood loss, chronic 08/07/2016   GERD (gastroesophageal reflux disease) 12/10/2015   Finger pain 11/13/2015   Carotid artery calcification 09/26/2015   External nasal lesion 09/25/2015   Muscle cramps 09/01/2015   Excessive sweating 07/30/2015   Headache 07/16/2015   Groin pain 05/14/2015   Muscle twitching 03/05/2015   Leg cramps 02/11/2015   Back pain 02/11/2015   Abdominal pain 02/11/2015   Chronic pain 01/19/2015   Acute cystitis without hematuria 12/12/2014   Left elbow pain 11/30/2014   Health care maintenance 11/30/2014   Osteoporosis 10/19/2014   Rectal bleeding 10/19/2014   Neck pain 09/03/2014   Unsteady gait 09/03/2014   Nocturia 09/03/2014   Dysphagia 06/01/2014   Nasal dryness 06/01/2014   Stress 06/01/2014   Fatigue 02/27/2014   Degenerative disc disease 12/21/2013   Pre-op evaluation 10/12/2013   Hoarseness 08/14/2013   Leg swelling 01/24/2013   CHF (congestive heart failure) (HCC) 12/29/2012   Cough 12/29/2012   OSA (obstructive sleep apnea) 07/09/2012   Osteoarthritis 07/04/2012   Symptomatic anemia 07/04/2012   Chronic constipation 07/04/2012   Pulmonary hypertension (HCC) 07/04/2012   Pulmonary nodules 07/04/2012   Hypertension 07/04/2012   Hyperlipidemia 07/04/2012   Hypothyroidism 07/04/2012    PCP: Dale Thar[place]own, MD  REFERRING PROVIDER: Dale Gregg, MD  REFERRING DIAG: M54.2 (ICD-10-CM) - Neck pain  THERAPY DIAG:  Cervicalgia  Muscle weakness (generalized)  Abnormal posture  Rationale for Evaluation and Treatment: Rehabilitation  ONSET DATE: ~9 months ago  SUBJECTIVE:  SUBJECTIVE STATEMENT:  Pt reports 6/10NPS in bilat shoulders today. She received two cortisone shots in bilat Ue's on Monday.  Hand dominance: Right  PERTINENT HISTORY:  Pt reports having chronic cervical pain for many years. History of cervical surgery in her 30's per pt reports. Pain has worsened gradually over the last several months. Referred pain down her LUE at mid deltoid. Reports pain as dull and achey. Reports having trouble using her LUE on her rollator in weightbearing and L shoulder pain with overhead activities. Denies N/T into her LUE or L hand weakness. Pt does have very kyphotic posture and cervical flexion with difficulty holding her head in a neutral position during subjective reports. Worst pain up to 9/10 NPS, best pain 4-5/10 NPS. Pain currently 6/10 NPS which she reports is her average.   PAIN:  Are you having pain? Yes: NPRS scale: 6/10 Pain location: L upper trap to top of L shoulder  Pain description: Dull and achey Aggravating factors: WB on rollator, overhead use with LUE.  Relieving factors: medications  PRECAUTIONS: None  RED FLAGS: Bowel or bladder incontinence: No, Spinal tumors: No, and Cauda equina syndrome: No     WEIGHT BEARING RESTRICTIONS: No  FALLS:  Has patient fallen in last 6 months? No  LIVING ENVIRONMENT: Lives with: lives with their son Lives in: House/apartment Has following equipment at home: Single point cane, Environmental consultant - 4 wheeled, Tour manager, and bed side commode  OCCUPATION: Retired  PLOF: Independent with transfers, Requires assistive device for independence, Needs assistance with ADLs, and Needs assistance with homemaking  PATIENT GOALS: Improve her pain   NEXT MD VISIT: September 2024  OBJECTIVE:   DIAGNOSTIC FINDINGS:  CLINICAL DATA:  Neck trauma (Age >=  65y)   EXAM: CT CERVICAL SPINE WITHOUT CONTRAST   TECHNIQUE: Multidetector CT imaging of the cervical spine was performed without intravenous contrast. Multiplanar CT image reconstructions were also generated.   RADIATION DOSE REDUCTION: This exam was performed according to the departmental dose-optimization program which includes automated exposure control, adjustment of the mA and/or kV according to patient size and/or use of iterative reconstruction technique.   COMPARISON:  08/18/2015   FINDINGS: Alignment: Facet joints are aligned without dislocation or traumatic listhesis. Dens and lateral masses are aligned. New grade 1 anterolisthesis of C3 on C4 secondary to progressive degenerative facet arthropathy.   Skull base and vertebrae: No acute fracture. No primary bone lesion or focal pathologic process. Congenital nonunion of the posterior arch of C1.   Soft tissues and spinal canal: No prevertebral fluid or swelling. No visible canal hematoma.   Disc levels: Advanced multilevel degenerative disc disease and facet arthropathy throughout the cervical spine, progressed since 2016. Prominent degenerative pannus formation posterior to the odontoid process resulting in canal narrowing at the C1-C2 level. Multiple additional levels of foraminal and canal stenosis, not well assessed by CT.   Upper chest: Mosaic attenuation of the included lung apices.   Other: Bilateral carotid atherosclerosis.   IMPRESSION: 1. No acute fracture or traumatic listhesis of the cervical spine. 2. Advanced multilevel degenerative disc disease and facet arthropathy throughout the cervical spine, progressed since 2016. 3. Mosaic attenuation of the included lung apices, which can be seen in the setting of small airways disease.     Electronically Signed   By: Duanne Guess D.O.   On: 02/22/2023 10:32  PATIENT SURVEYS:  FOTO 40/48  COGNITION: Overall cognitive status: Within functional  limits for tasks assessed  SENSATION: Light touch: [facility]  POSTURE: rounded shoulders, forward head, decreased lumbar lordosis, increased thoracic kyphosis, and flexed trunk   PALPATION: TTP along L cervical paraspinals and upper trap. Painful to palpate at L biceps tendon.TTP also on bilat pec major.   CERVICAL ROM:   Active ROM A/PROM (deg) eval  Flexion 60  Extension 20*  Right lateral flexion Unable  Left lateral flexion Unable  Right rotation 40  Left rotation 42   (Blank rows = not tested)  UPPER EXTREMITY ROM:  Active ROM Right eval Left eval  Shoulder flexion 124 122*  Shoulder extension    Shoulder abduction    Shoulder adduction    Shoulder extension    Shoulder internal rotation    Shoulder external rotation    Elbow flexion WNL WNL  Elbow extension WNL WNL  Wrist flexion    Wrist extension    Wrist ulnar deviation    Wrist radial deviation    Wrist pronation    Wrist supination     (Blank rows = not tested)  Shoulder ER, flexion, abduction to occiput  Shoulder extension, IR, adduction to T8/bra strap. Painful on L shoulder.   UPPER EXTREMITY MMT:  MMT Right eval Left eval  Shoulder flexion 4* 4*  Shoulder extension    Shoulder abduction 5* 5*  Shoulder adduction    Shoulder extension    Shoulder internal rotation    Shoulder external rotation    Middle trapezius    Lower trapezius    Elbow flexion 4 4*  Elbow extension 5 5  Wrist flexion 5 5  Wrist extension 5 5  Wrist ulnar deviation    Wrist radial deviation    Wrist pronation    Wrist supination    Grip strength Symmetrical Symmetrical   (Blank rows = not tested)  CERVICAL/SHOULDER SPECIAL TESTS:  Open can: positive on LUE     FUNCTIONAL TESTS:  Pt with significant cervical flexor contracture with attempts to lie supine. Unable to tolerate even inclined due to pt quickly reporting dizziness. Thus unable to assess further shoulder joint mobility, PROM  TODAY'S  TREATMENT: DATE: 06/05/23   Beginning of session spent reassessing pt's goals and POC to complete re-cert. (See below)    5xSTS administered using bilat UE support: 19.93 seconds   There.Ex:  Standing marches w/ bilat UE support at rollator x30 sec (increased difficulty w/ LLE> RLE, trialed 2# AW d/c 2/2 difficulty.)  Seated marches w/ 2# AW on bilat LE's 2 x30 sec  Seated LAQ w/ 2# AW's on bilat RLE/LLE 2x 10/ each side Seated Shoulder flexion to end range utilizing PVC pipe, w/ 1# AW 3 x8 Seated Shoulder horizontal abduction with RTB, 2x 10 Seated shoulder rows w/ GTB 3 x10  Bicep curls with 1# DB in bilat UE's 2 x12 Punches across midline w/ 1# DB RLE/LLE 3 x30 sec    PATIENT EDUCATION:  Education details: Prognosis, HEP, POC Person educated: Patient, Child(ren), and Caregiver   Education method: Explanation, Demonstration, Tactile cues, Verbal cues, and Handouts Education comprehension: verbalized understanding, returned demonstration, verbal cues required, tactile cues required, and needs further education  HOME EXERCISE PROGRAM: Access Code: [email or web address] URL: [email or web address] Date: 05/13/2023 Prepared by: Ronnie Derby & Tomasa Hose  Exercises - Seated Scapular Protraction and Retraction with Dowel  - 1 x daily - 7 x weekly - 3 sets - 8 reps - Seated Shoulder Flexion AAROM with Dowel  - 1 x daily - 7 x weekly - 3 sets - 8 reps - Seated  Cervical Extension AROM  - 1 x daily - 7 x weekly - 3 sets - 8 reps - Shoulder Rolls in Sitting  - 1 x daily - 7 x weekly - 3 sets - 10 reps - Supine Bicep Curl with Cane  - 1 x daily - 7 x weekly - 3 sets - 10 reps  ASSESSMENT:  CLINICAL IMPRESSION: Session focused on reviewing pt's STG and LTG's as pt requires re-certification for continued physical therapy. Pt notes achieving 1/4 of her goals, which is being HEP compliant. Pt continues to note bilat shoulder pain, has regressed on her FOTO score, and has maintained 20  deg of cervical extension. Pt previously received cortisone in bilat shoulders which has improved her shoulder mobility noted w/ increased activity tolerance at today's session. Pt continues to note decreased strength in bilat LE's and decreased cervico-thoracic mobility and would continue to benefit from 2 x/ week for 6 weeks of skilled PT interventions in order to improve overall QoL and return to PLOF.    OBJECTIVE IMPAIRMENTS: Abnormal gait, decreased mobility, decreased ROM, decreased strength, hypomobility, impaired flexibility, impaired UE functional use, improper body mechanics, postural dysfunction, and pain.   ACTIVITY LIMITATIONS: carrying, lifting, standing, bathing, toileting, dressing, reach over head, and hygiene/grooming  PARTICIPATION LIMITATIONS:  LUE weightbearing on rollator with gait  PERSONAL FACTORS: Age, Past/current experiences, Time since onset of injury/illness/exacerbation, and 3+ comorbidities: Anemia, CHF, DDD, depression, HLD, pulmonary HTN  are also affecting patient's functional outcome.   REHAB POTENTIAL: Poor Chronicity of symptoms, age, postural and position limitations  CLINICAL DECISION MAKING: Evolving/moderate complexity  EVALUATION COMPLEXITY: Moderate   GOALS: Goals reviewed with patient? No  SHORT TERM GOALS: Target date: 05/13/23  Pt will be compliant with HEP with min to modA from caregiver to address L cervical and shoulder pain and improve posture/cervical mobility Baseline: 04/22/23: provided initial HEP Goal status: MET    LONG TERM GOALS: Target date: 07/17/23  Pt will improve FOTO to target score to demonstrate clinically significant improvement in functional mobility. Baseline: 8/20: 40/48; 06/05/23: 35/48 Goal status: ONGOING  2.  Pt will report on average worst pain 4/10 or less to demonstrate clinically significant reduction in cervical pain.  Baseline: 04/22/23: 6/10 NPS on average worst pain 06/05/23: 6/10 NPS Goal status:  ONGOING  3.  Pt will improve cervical extension AROM by at least 5 degrees to assist in neutral head positioning for decreased neck pain and flexor contracture  Baseline: 04/22/23: 20 degrees 06/05/23: 20 degrees Goal status: ONGOING  4. Pt to improve 5x STS to < 14.8 seconds to display improved functional mobility and LE strength to meet age matched norms.  Baseline: 06/05/23: 19.93 seconds using BUE's on arm rests Goal status: INITIAL     PLAN:  PT FREQUENCY: 1-2x/week  PT DURATION: 6 weeks  PLANNED INTERVENTIONS: Therapeutic exercises, Therapeutic activity, Neuromuscular re-education, Balance training, Gait training, Patient/Family education, Self Care, Joint mobilization, Dry Needling, Spinal mobilization, Moist heat, Manual therapy, and Re-evaluation  PLAN FOR NEXT SESSION:  Postural strengthening/ cervical strengthening along w/ LE strengthening.   Lovie Macadamia, SPT  Delphia Grates. Fairly IV, PT, DPT Physical Therapist- [place]  [facility]  06/05/23, 2:20 PM

## 2023-06-10 ENCOUNTER — Ambulatory Visit: Payer: Medicare HMO

## 2023-06-10 DIAGNOSIS — M542 Cervicalgia: Secondary | ICD-10-CM

## 2023-06-10 DIAGNOSIS — R351 Nocturia: Secondary | ICD-10-CM | POA: Diagnosis not present

## 2023-06-10 DIAGNOSIS — N952 Postmenopausal atrophic vaginitis: Secondary | ICD-10-CM | POA: Diagnosis not present

## 2023-06-10 DIAGNOSIS — M6281 Muscle weakness (generalized): Secondary | ICD-10-CM

## 2023-06-10 DIAGNOSIS — R293 Abnormal posture: Secondary | ICD-10-CM | POA: Diagnosis not present

## 2023-06-10 DIAGNOSIS — Z87440 Personal history of urinary (tract) infections: Secondary | ICD-10-CM | POA: Diagnosis not present

## 2023-06-10 DIAGNOSIS — N281 Cyst of kidney, acquired: Secondary | ICD-10-CM | POA: Diagnosis not present

## 2023-06-10 DIAGNOSIS — R350 Frequency of micturition: Secondary | ICD-10-CM | POA: Diagnosis not present

## 2023-06-10 DIAGNOSIS — N39498 Other specified urinary incontinence: Secondary | ICD-10-CM | POA: Diagnosis not present

## 2023-06-10 DIAGNOSIS — N3010 Interstitial cystitis (chronic) without hematuria: Secondary | ICD-10-CM | POA: Diagnosis not present

## 2023-06-10 NOTE — Therapy (Addendum)
OUTPATIENT PHYSICAL THERAPY CERVICAL TREATMENT   Patient Name: Kimberly Roy MRN: [id number] DOB:Jan 29, 1931, 89 y.o., female Today's Date: 06/10/2023  END OF SESSION:  PT End of Session - 06/10/23 1427     Visit Number 6    Number of Visits 13    Date for PT Re-Evaluation 07/17/23    PT Start Time 1430    PT Stop Time 1514    PT Time Calculation (min) 44 min    Activity Tolerance Patient tolerated treatment well    Behavior During Therapy WFL for tasks assessed/performed              Past Medical History:  Diagnosis Date   Anemia    Anxiety    Chest pain    CHF (congestive heart failure) (HCC)    Constipation    DDD (degenerative disc disease), cervical    Depression    DVT (deep venous thrombosis) (HCC)    Dysphonia    Dyspnea    Fatty liver    Fatty liver    Headache    Hyperlipidemia    Hyperpiesia    Hypertension    Hypothyroidism    Interstitial cystitis    Left ventricular dysfunction    Lymphedema    Nephrolithiasis    Obstructive sleep apnea    Osteoarthritis    knees/cervical and lumbar spine   Pulmonary hypertension (HCC)    Pulmonary nodules    followed by Dr Meredeth Ide   Pure hypercholesterolemia    Renal cyst    right   Past Surgical History:  Procedure Laterality Date   ABDOMINAL HYSTERECTOMY     ovaries left in place   APPENDECTOMY     Back Surgeries     BACK SURGERY     BREAST REDUCTION SURGERY     3/99   CARDIAC CATHETERIZATION     cataracts Bilateral    CERVICAL SPINE SURGERY     ESOPHAGEAL MANOMETRY N/A 08/02/2015   Procedure: ESOPHAGEAL MANOMETRY (EM);  Surgeon: Elnita Maxwell, MD;  Location: [facility] ENDOSCOPY;  Service: Endoscopy;  Laterality: N/A;   ESOPHAGOGASTRODUODENOSCOPY N/A 02/27/2015   Procedure: ESOPHAGOGASTRODUODENOSCOPY (EGD);  Surgeon: Wallace Cullens, MD;  Location: [facility] ENDOSCOPY;  Service: Gastroenterology;  Laterality: N/A;   ESOPHAGOGASTRODUODENOSCOPY (EGD) WITH PROPOFOL N/A 10/30/2018   Procedure:  ESOPHAGOGASTRODUODENOSCOPY (EGD) WITH PROPOFOL;  Surgeon: Christena Deem, MD;  Location: [facility] ENDOSCOPY;  Service: Endoscopy;  Laterality: N/A;   EXCISIONAL HEMORRHOIDECTOMY     EYE SURGERY     FRACTURE SURGERY     HEMORRHOID SURGERY     HIP SURGERY  2013   Right hip surgery   JOINT REPLACEMENT     KNEE ARTHROSCOPY     left and right   ORIF FEMUR FRACTURE Right 08/07/2018   Procedure: OPEN REDUCTION INTERNAL FIXATION (ORIF) DISTAL FEMUR FRACTURE;  Surgeon: Kennedy Bucker, MD;  Location: [facility] ORS;  Service: Orthopedics;  Laterality: Right;   REDUCTION MAMMAPLASTY Bilateral YRS AGO   REPLACEMENT TOTAL KNEE Bilateral    rotator cuff surgery     blilateral   TONSILECTOMY/ADENOIDECTOMY WITH MYRINGOTOMY     VISCERAL ARTERY INTERVENTION N/A 02/25/2019   Procedure: VISCERAL ARTERY INTERVENTION;  Surgeon: Annice Needy, MD;  Location: [facility] INVASIVE CV LAB;  Service: Cardiovascular;  Laterality: N/A;   Patient Active Problem List   Diagnosis Date Noted   Asthma 05/15/2023   Fall 03/15/2023   Difficulty swallowing 02/13/2023   Bartholin cyst 02/11/2023   Abnormal CT of the abdomen 10/27/2022  Trochanteric bursitis, right hip 10/10/2022   Earlobe lesion, right 09/12/2022   Pneumonia 07/12/2022   Hypertensive urgency 07/12/2022   Aortic stenosis 07/12/2022   Soft tissue injury 07/12/2022   Anemia 04/13/2022   B12 deficiency 04/04/2022   Interstitial cystitis 03/28/2022   Iron deficiency 02/28/2022   Right foot pain 01/29/2022   SOB (shortness of breath) 10/14/2021   Rash 03/03/2021   Skin lesion 03/03/2021   Aortic atherosclerosis (HCC) 11/20/2020   Bradycardia 06/08/2020   Itching 04/08/2020   Dyspnea 01/17/2020   Chronic respiratory failure with hypoxia (HCC) 01/17/2020   Right hip pain 01/10/2020   Leukocytosis 11/15/2019   Wound of buttock 10/25/2019   COVID-19 virus infection 10/04/2019   Urinary frequency 09/26/2019   Cold feeling 09/12/2019   Knee pain 07/04/2019    Hemoptysis 06/30/2019   Chronic diastolic CHF (congestive heart failure) (HCC) 08/10/2018   Femur fracture (HCC) 08/06/2018   Primary osteoarthritis of right shoulder 08/15/2017   Shoulder pain 05/16/2017   Chronic venous insufficiency 02/10/2017   Lymphedema 02/10/2017   Neuropathy 12/25/2016   Anemia due to blood loss, chronic 08/07/2016   GERD (gastroesophageal reflux disease) 12/10/2015   Finger pain 11/13/2015   Carotid artery calcification 09/26/2015   External nasal lesion 09/25/2015   Muscle cramps 09/01/2015   Excessive sweating 07/30/2015   Headache 07/16/2015   Groin pain 05/14/2015   Muscle twitching 03/05/2015   Leg cramps 02/11/2015   Back pain 02/11/2015   Abdominal pain 02/11/2015   Chronic pain 01/19/2015   Acute cystitis without hematuria 12/12/2014   Left elbow pain 11/30/2014   Health care maintenance 11/30/2014   Osteoporosis 10/19/2014   Rectal bleeding 10/19/2014   Neck pain 09/03/2014   Unsteady gait 09/03/2014   Nocturia 09/03/2014   Dysphagia 06/01/2014   Nasal dryness 06/01/2014   Stress 06/01/2014   Fatigue 02/27/2014   Degenerative disc disease 12/21/2013   Pre-op evaluation 10/12/2013   Hoarseness 08/14/2013   Leg swelling 01/24/2013   CHF (congestive heart failure) (HCC) 12/29/2012   Cough 12/29/2012   OSA (obstructive sleep apnea) 07/09/2012   Osteoarthritis 07/04/2012   Symptomatic anemia 07/04/2012   Chronic constipation 07/04/2012   Pulmonary hypertension (HCC) 07/04/2012   Pulmonary nodules 07/04/2012   Hypertension 07/04/2012   Hyperlipidemia 07/04/2012   Hypothyroidism 07/04/2012    PCP: Dale [place], MD  REFERRING PROVIDER: Dale [place], MD  REFERRING DIAG: M54.2 (ICD-10-CM) - Neck pain  THERAPY DIAG:  Cervicalgia  Muscle weakness (generalized)  Abnormal posture  Rationale for Evaluation and Treatment: Rehabilitation  ONSET DATE: ~9 months ago  SUBJECTIVE:  SUBJECTIVE STATEMENT:  Pt reports 0-1/10NPS in bilat shoulders today. She states her cortisone shots are continuing to work.  Hand dominance: Right  PERTINENT HISTORY:  Pt reports having chronic cervical pain for many years. History of cervical surgery in her 30's per pt reports. Pain has worsened gradually over the last several months. Referred pain down her LUE at mid deltoid. Reports pain as dull and achey. Reports having trouble using her LUE on her rollator in weightbearing and L shoulder pain with overhead activities. Denies N/T into her LUE or L hand weakness. Pt does have very kyphotic posture and cervical flexion with difficulty holding her head in a neutral position during subjective reports. Worst pain up to 9/10 NPS, best pain 4-5/10 NPS. Pain currently 6/10 NPS which she reports is her average.   PAIN:  Are you having pain? Yes: NPRS scale: 0-1/10 Pain location: L upper trap to top of L shoulder  Pain description: Dull and achey Aggravating factors: WB on rollator, overhead use with LUE.  Relieving factors: medications  PRECAUTIONS: None  RED FLAGS: Bowel or bladder incontinence: No, Spinal tumors: No, and Cauda equina syndrome: No     WEIGHT BEARING RESTRICTIONS: No  FALLS:  Has patient fallen in last 6 months? No  LIVING ENVIRONMENT: Lives with: lives with their son Lives in: House/apartment Has following equipment at home: Single point cane, Environmental consultant - 4 wheeled, Tour manager, and bed side commode  OCCUPATION: Retired  PLOF: Independent with transfers, Requires assistive device for independence, Needs assistance with ADLs, and Needs assistance with homemaking  PATIENT GOALS: Improve her pain   NEXT MD VISIT: September 2024  OBJECTIVE:   DIAGNOSTIC FINDINGS:  CLINICAL DATA:  Neck trauma (Age >= 65y)    EXAM: CT CERVICAL SPINE WITHOUT CONTRAST   TECHNIQUE: Multidetector CT imaging of the cervical spine was performed without intravenous contrast. Multiplanar CT image reconstructions were also generated.   RADIATION DOSE REDUCTION: This exam was performed according to the departmental dose-optimization program which includes automated exposure control, adjustment of the mA and/or kV according to patient size and/or use of iterative reconstruction technique.   COMPARISON:  08/18/2015   FINDINGS: Alignment: Facet joints are aligned without dislocation or traumatic listhesis. Dens and lateral masses are aligned. New grade 1 anterolisthesis of C3 on C4 secondary to progressive degenerative facet arthropathy.   Skull base and vertebrae: No acute fracture. No primary bone lesion or focal pathologic process. Congenital nonunion of the posterior arch of C1.   Soft tissues and spinal canal: No prevertebral fluid or swelling. No visible canal hematoma.   Disc levels: Advanced multilevel degenerative disc disease and facet arthropathy throughout the cervical spine, progressed since 2016. Prominent degenerative pannus formation posterior to the odontoid process resulting in canal narrowing at the C1-C2 level. Multiple additional levels of foraminal and canal stenosis, not well assessed by CT.   Upper chest: Mosaic attenuation of the included lung apices.   Other: Bilateral carotid atherosclerosis.   IMPRESSION: 1. No acute fracture or traumatic listhesis of the cervical spine. 2. Advanced multilevel degenerative disc disease and facet arthropathy throughout the cervical spine, progressed since 2016. 3. Mosaic attenuation of the included lung apices, which can be seen in the setting of small airways disease.     Electronically Signed   By: Duanne Guess D.O.   On: 02/22/2023 10:32  PATIENT SURVEYS:  FOTO 40/48  COGNITION: Overall cognitive status: Within functional limits  for tasks assessed  SENSATION: Light touch: WFL  POSTURE:  rounded shoulders, forward head, decreased lumbar lordosis, increased thoracic kyphosis, and flexed trunk   PALPATION: TTP along L cervical paraspinals and upper trap. Painful to palpate at L biceps tendon.TTP also on bilat pec major.   CERVICAL ROM:   Active ROM A/PROM (deg) eval  Flexion 60  Extension 20*  Right lateral flexion Unable  Left lateral flexion Unable  Right rotation 40  Left rotation 42   (Blank rows = not tested)  UPPER EXTREMITY ROM:  Active ROM Right eval Left eval  Shoulder flexion 124 122*  Shoulder extension    Shoulder abduction    Shoulder adduction    Shoulder extension    Shoulder internal rotation    Shoulder external rotation    Elbow flexion WNL WNL  Elbow extension WNL WNL  Wrist flexion    Wrist extension    Wrist ulnar deviation    Wrist radial deviation    Wrist pronation    Wrist supination     (Blank rows = not tested)  Shoulder ER, flexion, abduction to occiput  Shoulder extension, IR, adduction to T8/bra strap. Painful on L shoulder.   UPPER EXTREMITY MMT:  MMT Right eval Left eval  Shoulder flexion 4* 4*  Shoulder extension    Shoulder abduction 5* 5*  Shoulder adduction    Shoulder extension    Shoulder internal rotation    Shoulder external rotation    Middle trapezius    Lower trapezius    Elbow flexion 4 4*  Elbow extension 5 5  Wrist flexion 5 5  Wrist extension 5 5  Wrist ulnar deviation    Wrist radial deviation    Wrist pronation    Wrist supination    Grip strength Symmetrical Symmetrical   (Blank rows = not tested)  CERVICAL/SHOULDER SPECIAL TESTS:  Open can: positive on LUE     FUNCTIONAL TESTS:  Pt with significant cervical flexor contracture with attempts to lie supine. Unable to tolerate even inclined due to pt quickly reporting dizziness. Thus unable to assess further shoulder joint mobility, PROM  TODAY'S TREATMENT: DATE:  06/10/23  There.Ex:  STS from chair w/ bilat UE support 2 x5; CGA Standing marches w/ bilat UE support at RW x30 sec  Seated marches w/ 2# AW on bilat LE's 2 x30 sec  Seated alternating LAQ w/ 2# AW's on bilat RLE/LLE 3 x30 sec Seated Shoulder flexion to end range utilizing PVC pipe, w/ 1# AW 3 x10 Seated Shoulder horizontal abduction with RTB, 2x 10 Seated shoulder rows w/ GTB 3 x10  Bicep curls with 1# DB in bilat UE's 2 x12   Attempts made by PT to fix R brake on rollator. Unsuccessful with attempts. Educated pt's caregiver to block R posterior wheel with foot during patient transfers. Caregiver aware of brake issue and plans to get it fixed. Educated pt to use other rollator at home to reduce falls risk.   PATIENT EDUCATION:  Education details: Prognosis, HEP, POC Person educated: Patient, Child(ren), and Caregiver   Education method: Explanation, Demonstration, Tactile cues, Verbal cues, and Handouts Education comprehension: verbalized understanding, returned demonstration, verbal cues required, tactile cues required, and needs further education  HOME EXERCISE PROGRAM: Access Code: [email or web address] URL: [email or web address] Date: 05/13/2023 Prepared by: Ronnie Derby & Tomasa Hose  Exercises - Seated Scapular Protraction and Retraction with Dowel  - 1 x daily - 7 x weekly - 3 sets - 8 reps - Seated Shoulder Flexion AAROM with Dowel  - 1 x daily - 7  x weekly - 3 sets - 8 reps - Seated Cervical Extension AROM  - 1 x daily - 7 x weekly - 3 sets - 8 reps - Shoulder Rolls in Sitting  - 1 x daily - 7 x weekly - 3 sets - 10 reps - Supine Bicep Curl with Cane  - 1 x daily - 7 x weekly - 3 sets - 10 reps  ASSESSMENT:  CLINICAL IMPRESSION: Session focused on progressing pt's cervico-thoracic mobility and bilat LE strengthening. Pt displays improvements w/ bilat UE strengthening noted w/ improved activity tolerance and decreased pain levels w/ UE activities at today's session.  Pt continues to note deficits in LE strength displayed w/ increased fatigue following STS and standing marching exercises. Future sessions will continue to emphasize UE and LE strengthening to improve pt's functional mobility. Pt would continue to benefit from skilled PT interventions in order to improve overall QoL and return to PLOF.    OBJECTIVE IMPAIRMENTS: Abnormal gait, decreased mobility, decreased ROM, decreased strength, hypomobility, impaired flexibility, impaired UE functional use, improper body mechanics, postural dysfunction, and pain.   ACTIVITY LIMITATIONS: carrying, lifting, standing, bathing, toileting, dressing, reach over head, and hygiene/grooming  PARTICIPATION LIMITATIONS:  LUE weightbearing on rollator with gait  PERSONAL FACTORS: Age, Past/current experiences, Time since onset of injury/illness/exacerbation, and 3+ comorbidities: Anemia, CHF, DDD, depression, HLD, pulmonary HTN  are also affecting patient's functional outcome.   REHAB POTENTIAL: Poor Chronicity of symptoms, age, postural and position limitations  CLINICAL DECISION MAKING: Evolving/moderate complexity  EVALUATION COMPLEXITY: Moderate   GOALS: Goals reviewed with patient? No  SHORT TERM GOALS: Target date: 05/13/23  Pt will be compliant with HEP with min to modA from caregiver to address L cervical and shoulder pain and improve posture/cervical mobility Baseline: 04/22/23: provided initial HEP Goal status: MET    LONG TERM GOALS: Target date: 07/17/23  Pt will improve FOTO to target score to demonstrate clinically significant improvement in functional mobility. Baseline: 8/20: 40/48; 06/05/23: 35/48 Goal status: ONGOING  2.  Pt will report on average worst pain 4/10 or less to demonstrate clinically significant reduction in cervical pain.  Baseline: 04/22/23: 6/10 NPS on average worst pain 06/05/23: 6/10 NPS Goal status: ONGOING  3.  Pt will improve cervical extension AROM by at least 5 degrees  to assist in neutral head positioning for decreased neck pain and flexor contracture  Baseline: 04/22/23: 20 degrees 06/05/23: 20 degrees Goal status: ONGOING  4. Pt to improve 5x STS to < 14.8 seconds to display improved functional mobility and LE strength to meet age matched norms.  Baseline: 06/05/23: 19.93 seconds using BUE's on arm rests Goal status: INITIAL     PLAN:  PT FREQUENCY: 1-2x/week  PT DURATION: 6 weeks  PLANNED INTERVENTIONS: Therapeutic exercises, Therapeutic activity, Neuromuscular re-education, Balance training, Gait training, Patient/Family education, Self Care, Joint mobilization, Dry Needling, Spinal mobilization, Moist heat, Manual therapy, and Re-evaluation  PLAN FOR NEXT SESSION: cervico-thoracic strengthening along w/ LE strengthening exercises.  Lovie Macadamia, SPT  Delphia Grates. Fairly IV, PT, DPT Physical Therapist- [place]  [facility] [place]  06/10/23, 3:48 PM

## 2023-06-11 DIAGNOSIS — M47812 Spondylosis without myelopathy or radiculopathy, cervical region: Secondary | ICD-10-CM | POA: Diagnosis not present

## 2023-06-12 ENCOUNTER — Ambulatory Visit: Payer: Medicare HMO

## 2023-06-12 DIAGNOSIS — R293 Abnormal posture: Secondary | ICD-10-CM

## 2023-06-12 DIAGNOSIS — M6281 Muscle weakness (generalized): Secondary | ICD-10-CM | POA: Diagnosis not present

## 2023-06-12 DIAGNOSIS — M542 Cervicalgia: Secondary | ICD-10-CM

## 2023-06-12 NOTE — Therapy (Addendum)
OUTPATIENT PHYSICAL THERAPY CERVICAL TREATMENT   Patient Name: Kimberly Roy MRN: [id number] DOB:08-Jun-1931, 89 y.o., female Today's Date: 06/12/2023  END OF SESSION:  PT End of Session - 06/12/23 1441     Visit Number 7    Number of Visits 13    Date for PT Re-Evaluation 07/17/23    PT Start Time 1435    PT Stop Time 1513    PT Time Calculation (min) 38 min    Activity Tolerance Patient tolerated treatment well    Behavior During Therapy WFL for tasks assessed/performed              Past Medical History:  Diagnosis Date   Anemia    Anxiety    Chest pain    CHF (congestive heart failure) (HCC)    Constipation    DDD (degenerative disc disease), cervical    Depression    DVT (deep venous thrombosis) (HCC)    Dysphonia    Dyspnea    Fatty liver    Fatty liver    Headache    Hyperlipidemia    Hyperpiesia    Hypertension    Hypothyroidism    Interstitial cystitis    Left ventricular dysfunction    Lymphedema    Nephrolithiasis    Obstructive sleep apnea    Osteoarthritis    knees/cervical and lumbar spine   Pulmonary hypertension (HCC)    Pulmonary nodules    followed by Dr Meredeth Ide   Pure hypercholesterolemia    Renal cyst    right   Past Surgical History:  Procedure Laterality Date   ABDOMINAL HYSTERECTOMY     ovaries left in place   APPENDECTOMY     Back Surgeries     BACK SURGERY     BREAST REDUCTION SURGERY     3/99   CARDIAC CATHETERIZATION     cataracts Bilateral    CERVICAL SPINE SURGERY     ESOPHAGEAL MANOMETRY N/A 08/02/2015   Procedure: ESOPHAGEAL MANOMETRY (EM);  Surgeon: Elnita Maxwell, MD;  Location: [facility] ENDOSCOPY;  Service: Endoscopy;  Laterality: N/A;   ESOPHAGOGASTRODUODENOSCOPY N/A 02/27/2015   Procedure: ESOPHAGOGASTRODUODENOSCOPY (EGD);  Surgeon: Wallace Cullens, MD;  Location: [facility] ENDOSCOPY;  Service: Gastroenterology;  Laterality: N/A;   ESOPHAGOGASTRODUODENOSCOPY (EGD) WITH PROPOFOL N/A 10/30/2018   Procedure:  ESOPHAGOGASTRODUODENOSCOPY (EGD) WITH PROPOFOL;  Surgeon: Christena Deem, MD;  Location: [facility] ENDOSCOPY;  Service: Endoscopy;  Laterality: N/A;   EXCISIONAL HEMORRHOIDECTOMY     EYE SURGERY     FRACTURE SURGERY     HEMORRHOID SURGERY     HIP SURGERY  2013   Right hip surgery   JOINT REPLACEMENT     KNEE ARTHROSCOPY     left and right   ORIF FEMUR FRACTURE Right 08/07/2018   Procedure: OPEN REDUCTION INTERNAL FIXATION (ORIF) DISTAL FEMUR FRACTURE;  Surgeon: Kennedy Bucker, MD;  Location: [facility] ORS;  Service: Orthopedics;  Laterality: Right;   REDUCTION MAMMAPLASTY Bilateral YRS AGO   REPLACEMENT TOTAL KNEE Bilateral    rotator cuff surgery     blilateral   TONSILECTOMY/ADENOIDECTOMY WITH MYRINGOTOMY     VISCERAL ARTERY INTERVENTION N/A 02/25/2019   Procedure: VISCERAL ARTERY INTERVENTION;  Surgeon: Annice Needy, MD;  Location: [facility] INVASIVE CV LAB;  Service: Cardiovascular;  Laterality: N/A;   Patient Active Problem List   Diagnosis Date Noted   Asthma 05/15/2023   Fall 03/15/2023   Difficulty swallowing 02/13/2023   Bartholin cyst 02/11/2023   Abnormal CT of the abdomen 10/27/2022  Trochanteric bursitis, right hip 10/10/2022   Earlobe lesion, right 09/12/2022   Pneumonia 07/12/2022   Hypertensive urgency 07/12/2022   Aortic stenosis 07/12/2022   Soft tissue injury 07/12/2022   Anemia 04/13/2022   B12 deficiency 04/04/2022   Interstitial cystitis 03/28/2022   Iron deficiency 02/28/2022   Right foot pain 01/29/2022   SOB (shortness of breath) 10/14/2021   Rash 03/03/2021   Skin lesion 03/03/2021   Aortic atherosclerosis (HCC) 11/20/2020   Bradycardia 06/08/2020   Itching 04/08/2020   Dyspnea 01/17/2020   Chronic respiratory failure with hypoxia (HCC) 01/17/2020   Right hip pain 01/10/2020   Leukocytosis 11/15/2019   Wound of buttock 10/25/2019   COVID-19 virus infection 10/04/2019   Urinary frequency 09/26/2019   Cold feeling 09/12/2019   Knee pain 07/04/2019    Hemoptysis 06/30/2019   Chronic diastolic CHF (congestive heart failure) (HCC) 08/10/2018   Femur fracture (HCC) 08/06/2018   Primary osteoarthritis of right shoulder 08/15/2017   Shoulder pain 05/16/2017   Chronic venous insufficiency 02/10/2017   Lymphedema 02/10/2017   Neuropathy 12/25/2016   Anemia due to blood loss, chronic 08/07/2016   GERD (gastroesophageal reflux disease) 12/10/2015   Finger pain 11/13/2015   Carotid artery calcification 09/26/2015   External nasal lesion 09/25/2015   Muscle cramps 09/01/2015   Excessive sweating 07/30/2015   Headache 07/16/2015   Groin pain 05/14/2015   Muscle twitching 03/05/2015   Leg cramps 02/11/2015   Back pain 02/11/2015   Abdominal pain 02/11/2015   Chronic pain 01/19/2015   Acute cystitis without hematuria 12/12/2014   Left elbow pain 11/30/2014   Health care maintenance 11/30/2014   Osteoporosis 10/19/2014   Rectal bleeding 10/19/2014   Neck pain 09/03/2014   Unsteady gait 09/03/2014   Nocturia 09/03/2014   Dysphagia 06/01/2014   Nasal dryness 06/01/2014   Stress 06/01/2014   Fatigue 02/27/2014   Degenerative disc disease 12/21/2013   Pre-op evaluation 10/12/2013   Hoarseness 08/14/2013   Leg swelling 01/24/2013   CHF (congestive heart failure) (HCC) 12/29/2012   Cough 12/29/2012   OSA (obstructive sleep apnea) 07/09/2012   Osteoarthritis 07/04/2012   Symptomatic anemia 07/04/2012   Chronic constipation 07/04/2012   Pulmonary hypertension (HCC) 07/04/2012   Pulmonary nodules 07/04/2012   Hypertension 07/04/2012   Hyperlipidemia 07/04/2012   Hypothyroidism 07/04/2012    PCP: Dale Kennedale, MD  REFERRING PROVIDER: Dale Hardwick, MD  REFERRING DIAG: M54.2 (ICD-10-CM) - Neck pain  THERAPY DIAG:  Cervicalgia  Muscle weakness (generalized)  Abnormal posture  Rationale for Evaluation and Treatment: Rehabilitation  ONSET DATE: ~9 months ago  SUBJECTIVE:  SUBJECTIVE STATEMENT:  Pt reports no pain in bilat shoulders at today's session. Pt's daughter reports that lidocaine procedure went well.   Hand dominance: Right  PERTINENT HISTORY:  Pt reports having chronic cervical pain for many years. History of cervical surgery in her 30's per pt reports. Pain has worsened gradually over the last several months. Referred pain down her LUE at mid deltoid. Reports pain as dull and achey. Reports having trouble using her LUE on her rollator in weightbearing and L shoulder pain with overhead activities. Denies N/T into her LUE or L hand weakness. Pt does have very kyphotic posture and cervical flexion with difficulty holding her head in a neutral position during subjective reports. Worst pain up to 9/10 NPS, best pain 4-5/10 NPS. Pain currently 6/10 NPS which she reports is her average.   PAIN:  Are you having pain? Yes: NPRS scale: 0-1/10 Pain location: L upper trap to top of L shoulder  Pain description: Dull and achey Aggravating factors: WB on rollator, overhead use with LUE.  Relieving factors: medications  PRECAUTIONS: None  RED FLAGS: Bowel or bladder incontinence: No, Spinal tumors: No, and Cauda equina syndrome: No     WEIGHT BEARING RESTRICTIONS: No  FALLS:  Has patient fallen in last 6 months? No  LIVING ENVIRONMENT: Lives with: lives with their son Lives in: House/apartment Has following equipment at home: Single point cane, Environmental consultant - 4 wheeled, Tour manager, and bed side commode  OCCUPATION: Retired  PLOF: Independent with transfers, Requires assistive device for independence, Needs assistance with ADLs, and Needs assistance with homemaking  PATIENT GOALS: Improve her pain   NEXT MD VISIT: September 2024  OBJECTIVE:   DIAGNOSTIC FINDINGS:  CLINICAL DATA:  Neck trauma (Age >=  65y)   EXAM: CT CERVICAL SPINE WITHOUT CONTRAST   TECHNIQUE: Multidetector CT imaging of the cervical spine was performed without intravenous contrast. Multiplanar CT image reconstructions were also generated.   RADIATION DOSE REDUCTION: This exam was performed according to the departmental dose-optimization program which includes automated exposure control, adjustment of the mA and/or kV according to patient size and/or use of iterative reconstruction technique.   COMPARISON:  08/18/2015   FINDINGS: Alignment: Facet joints are aligned without dislocation or traumatic listhesis. Dens and lateral masses are aligned. New grade 1 anterolisthesis of C3 on C4 secondary to progressive degenerative facet arthropathy.   Skull base and vertebrae: No acute fracture. No primary bone lesion or focal pathologic process. Congenital nonunion of the posterior arch of C1.   Soft tissues and spinal canal: No prevertebral fluid or swelling. No visible canal hematoma.   Disc levels: Advanced multilevel degenerative disc disease and facet arthropathy throughout the cervical spine, progressed since 2016. Prominent degenerative pannus formation posterior to the odontoid process resulting in canal narrowing at the C1-C2 level. Multiple additional levels of foraminal and canal stenosis, not well assessed by CT.   Upper chest: Mosaic attenuation of the included lung apices.   Other: Bilateral carotid atherosclerosis.   IMPRESSION: 1. No acute fracture or traumatic listhesis of the cervical spine. 2. Advanced multilevel degenerative disc disease and facet arthropathy throughout the cervical spine, progressed since 2016. 3. Mosaic attenuation of the included lung apices, which can be seen in the setting of small airways disease.     Electronically Signed   By: Duanne Guess D.O.   On: 02/22/2023 10:32  PATIENT SURVEYS:  FOTO 40/48  COGNITION: Overall cognitive status: Within functional  limits for tasks assessed  SENSATION: Light touch:  WFL  POSTURE: rounded shoulders, forward head, decreased lumbar lordosis, increased thoracic kyphosis, and flexed trunk   PALPATION: TTP along L cervical paraspinals and upper trap. Painful to palpate at L biceps tendon.TTP also on bilat pec major.   CERVICAL ROM:   Active ROM A/PROM (deg) eval  Flexion 60  Extension 20*  Right lateral flexion Unable  Left lateral flexion Unable  Right rotation 40  Left rotation 42   (Blank rows = not tested)  UPPER EXTREMITY ROM:  Active ROM Right eval Left eval  Shoulder flexion 124 122*  Shoulder extension    Shoulder abduction    Shoulder adduction    Shoulder extension    Shoulder internal rotation    Shoulder external rotation    Elbow flexion WNL WNL  Elbow extension WNL WNL  Wrist flexion    Wrist extension    Wrist ulnar deviation    Wrist radial deviation    Wrist pronation    Wrist supination     (Blank rows = not tested)  Shoulder ER, flexion, abduction to occiput  Shoulder extension, IR, adduction to T8/bra strap. Painful on L shoulder.   UPPER EXTREMITY MMT:  MMT Right eval Left eval  Shoulder flexion 4* 4*  Shoulder extension    Shoulder abduction 5* 5*  Shoulder adduction    Shoulder extension    Shoulder internal rotation    Shoulder external rotation    Middle trapezius    Lower trapezius    Elbow flexion 4 4*  Elbow extension 5 5  Wrist flexion 5 5  Wrist extension 5 5  Wrist ulnar deviation    Wrist radial deviation    Wrist pronation    Wrist supination    Grip strength Symmetrical Symmetrical   (Blank rows = not tested)  CERVICAL/SHOULDER SPECIAL TESTS:  Open can: positive on LUE     FUNCTIONAL TESTS:  Pt with significant cervical flexor contracture with attempts to lie supine. Unable to tolerate even inclined due to pt quickly reporting dizziness. Thus unable to assess further shoulder joint mobility, PROM  TODAY'S  TREATMENT: DATE: 06/12/23  There.Ex:  Seated Shoulder flexion to end range utilizing PVC pipe, w/ 1# AW 2 x10 Seated Shoulder horizontal abduction with GTB, 2x 10 Seated shoulder rows w/ GTB 3 x10  Bicep curls with 1# DB in bilat UE's 2 x12 Seated cervical extension against wall with ball for resistance behind occiput, 3 x10 Seated crossbody punches w/ 1# DB in bilat Ue's 2 x10  Seated shoulder flexion w/ 1# DB in bilat UE's 2 x10   PATIENT EDUCATION:  Education details: Prognosis, HEP, POC Person educated: Patient, Child(ren), and Caregiver   Education method: Explanation, Demonstration, Tactile cues, Verbal cues, and Handouts Education comprehension: verbalized understanding, returned demonstration, verbal cues required, tactile cues required, and needs further education  HOME EXERCISE PROGRAM: Access Code: [email or web address] URL: [email or web address] Date: 05/13/2023 Prepared by: Ronnie Derby & Tomasa Hose  Exercises - Seated Scapular Protraction and Retraction with Dowel  - 1 x daily - 7 x weekly - 3 sets - 8 reps - Seated Shoulder Flexion AAROM with Dowel  - 1 x daily - 7 x weekly - 3 sets - 8 reps - Seated Cervical Extension AROM  - 1 x daily - 7 x weekly - 3 sets - 8 reps - Shoulder Rolls in Sitting  - 1 x daily - 7 x weekly - 3 sets - 10 reps - Supine Bicep Curl with Cane  - 1  x daily - 7 x weekly - 3 sets - 10 reps  ASSESSMENT:  CLINICAL IMPRESSION: Session focused on progressing pt's cervico-thoracic mobility and strength. Pt notes reduced pain levels in bilat shoulders following a recent lidocaine procedure that she had completed earlier this week. This allowed pt to note increased activity tolerance to cervico-thoracic strengthening exercises at today's session. Seated chin tucks were attempted but unsuccessful 2/2 pt's increased thoracic kyphosis, however pt tolerated seated cervical extensions with a ball against the wall well to improve cervical ext strength.  Pt would continue to benefit from skilled PT interventions in order to address remaining strength and ROM deficits and improve overall QoL to return to PLOF.     OBJECTIVE IMPAIRMENTS: Abnormal gait, decreased mobility, decreased ROM, decreased strength, hypomobility, impaired flexibility, impaired UE functional use, improper body mechanics, postural dysfunction, and pain.   ACTIVITY LIMITATIONS: carrying, lifting, standing, bathing, toileting, dressing, reach over head, and hygiene/grooming  PARTICIPATION LIMITATIONS:  LUE weightbearing on rollator with gait  PERSONAL FACTORS: Age, Past/current experiences, Time since onset of injury/illness/exacerbation, and 3+ comorbidities: Anemia, CHF, DDD, depression, HLD, pulmonary HTN  are also affecting patient's functional outcome.   REHAB POTENTIAL: Poor Chronicity of symptoms, age, postural and position limitations  CLINICAL DECISION MAKING: Evolving/moderate complexity  EVALUATION COMPLEXITY: Moderate   GOALS: Goals reviewed with patient? No  SHORT TERM GOALS: Target date: 05/13/23  Pt will be compliant with HEP with min to modA from caregiver to address L cervical and shoulder pain and improve posture/cervical mobility Baseline: 04/22/23: provided initial HEP Goal status: MET    LONG TERM GOALS: Target date: 07/17/23  Pt will improve FOTO to target score to demonstrate clinically significant improvement in functional mobility. Baseline: 8/20: 40/48; 06/05/23: 35/48 Goal status: ONGOING  2.  Pt will report on average worst pain 4/10 or less to demonstrate clinically significant reduction in cervical pain.  Baseline: 04/22/23: 6/10 NPS on average worst pain 06/05/23: 6/10 NPS Goal status: ONGOING  3.  Pt will improve cervical extension AROM by at least 5 degrees to assist in neutral head positioning for decreased neck pain and flexor contracture  Baseline: 04/22/23: 20 degrees 06/05/23: 20 degrees Goal status: ONGOING  4. Pt to improve  5x STS to < 14.8 seconds to display improved functional mobility and LE strength to meet age matched norms.  Baseline: 06/05/23: 19.93 seconds using BUE's on arm rests Goal status: INITIAL     PLAN:  PT FREQUENCY: 1-2x/week  PT DURATION: 6 weeks  PLANNED INTERVENTIONS: Therapeutic exercises, Therapeutic activity, Neuromuscular re-education, Balance training, Gait training, Patient/Family education, Self Care, Joint mobilization, Dry Needling, Spinal mobilization, Moist heat, Manual therapy, and Re-evaluation  PLAN FOR NEXT SESSION: Continue to progress cervico-thoracic strengthening   Lovie Macadamia, SPT  Delphia Grates. Fairly IV, PT, DPT Physical Therapist- [facility]  06/12/23, 9:21 PM

## 2023-06-17 ENCOUNTER — Ambulatory Visit: Payer: Medicare HMO

## 2023-06-17 DIAGNOSIS — M6281 Muscle weakness (generalized): Secondary | ICD-10-CM

## 2023-06-17 DIAGNOSIS — M542 Cervicalgia: Secondary | ICD-10-CM

## 2023-06-17 DIAGNOSIS — R293 Abnormal posture: Secondary | ICD-10-CM

## 2023-06-17 DIAGNOSIS — Z23 Encounter for immunization: Secondary | ICD-10-CM

## 2023-06-17 NOTE — Progress Notes (Signed)
Patient presented for a high dose flu vaccine and it was administered into her right deltoid. Patient tolerated the vaccine well.

## 2023-06-17 NOTE — Therapy (Cosign Needed)
OUTPATIENT PHYSICAL THERAPY CERVICAL TREATMENT   Patient Name: Kimberly Roy MRN: [id number] DOB:Dec 13, 1930, 89 y.o., female Today's Date: 06/18/2023  END OF SESSION:  PT End of Session - 06/17/23 1431     Visit Number 8    Number of Visits 13    Date for PT Re-Evaluation 07/17/23    PT Start Time 1431    PT Stop Time 1511    PT Time Calculation (min) 40 min    Activity Tolerance Patient tolerated treatment well    Behavior During Therapy WFL for tasks assessed/performed              Past Medical History:  Diagnosis Date   Anemia    Anxiety    Chest pain    CHF (congestive heart failure) (HCC)    Constipation    DDD (degenerative disc disease), cervical    Depression    DVT (deep venous thrombosis) (HCC)    Dysphonia    Dyspnea    Fatty liver    Fatty liver    Headache    Hyperlipidemia    Hyperpiesia    Hypertension    Hypothyroidism    Interstitial cystitis    Left ventricular dysfunction    Lymphedema    Nephrolithiasis    Obstructive sleep apnea    Osteoarthritis    knees/cervical and lumbar spine   Pulmonary hypertension (HCC)    Pulmonary nodules    followed by Dr Meredeth Ide   Pure hypercholesterolemia    Renal cyst    right   Past Surgical History:  Procedure Laterality Date   ABDOMINAL HYSTERECTOMY     ovaries left in place   APPENDECTOMY     Back Surgeries     BACK SURGERY     BREAST REDUCTION SURGERY     3/99   CARDIAC CATHETERIZATION     cataracts Bilateral    CERVICAL SPINE SURGERY     ESOPHAGEAL MANOMETRY N/A 08/02/2015   Procedure: ESOPHAGEAL MANOMETRY (EM);  Surgeon: Elnita Maxwell, MD;  Location: [facility] ENDOSCOPY;  Service: Endoscopy;  Laterality: N/A;   ESOPHAGOGASTRODUODENOSCOPY N/A 02/27/2015   Procedure: ESOPHAGOGASTRODUODENOSCOPY (EGD);  Surgeon: Wallace Cullens, MD;  Location: [facility] ENDOSCOPY;  Service: Gastroenterology;  Laterality: N/A;   ESOPHAGOGASTRODUODENOSCOPY (EGD) WITH PROPOFOL N/A 10/30/2018   Procedure:  ESOPHAGOGASTRODUODENOSCOPY (EGD) WITH PROPOFOL;  Surgeon: Christena Deem, MD;  Location: [facility] ENDOSCOPY;  Service: Endoscopy;  Laterality: N/A;   EXCISIONAL HEMORRHOIDECTOMY     EYE SURGERY     FRACTURE SURGERY     HEMORRHOID SURGERY     HIP SURGERY  2013   Right hip surgery   JOINT REPLACEMENT     KNEE ARTHROSCOPY     left and right   ORIF FEMUR FRACTURE Right 08/07/2018   Procedure: OPEN REDUCTION INTERNAL FIXATION (ORIF) DISTAL FEMUR FRACTURE;  Surgeon: Kennedy Bucker, MD;  Location: [facility] ORS;  Service: Orthopedics;  Laterality: Right;   REDUCTION MAMMAPLASTY Bilateral YRS AGO   REPLACEMENT TOTAL KNEE Bilateral    rotator cuff surgery     blilateral   TONSILECTOMY/ADENOIDECTOMY WITH MYRINGOTOMY     VISCERAL ARTERY INTERVENTION N/A 02/25/2019   Procedure: VISCERAL ARTERY INTERVENTION;  Surgeon: Annice Needy, MD;  Location: [facility] INVASIVE CV LAB;  Service: Cardiovascular;  Laterality: N/A;   Patient Active Problem List   Diagnosis Date Noted   Asthma 05/15/2023   Fall 03/15/2023   Difficulty swallowing 02/13/2023   Bartholin cyst 02/11/2023   Abnormal CT of the abdomen 10/27/2022  Trochanteric bursitis, right hip 10/10/2022   Earlobe lesion, right 09/12/2022   Pneumonia 07/12/2022   Hypertensive urgency 07/12/2022   Aortic stenosis 07/12/2022   Soft tissue injury 07/12/2022   Anemia 04/13/2022   B12 deficiency 04/04/2022   Interstitial cystitis 03/28/2022   Iron deficiency 02/28/2022   Right foot pain 01/29/2022   SOB (shortness of breath) 10/14/2021   Rash 03/03/2021   Skin lesion 03/03/2021   Aortic atherosclerosis (HCC) 11/20/2020   Bradycardia 06/08/2020   Itching 04/08/2020   Dyspnea 01/17/2020   Chronic respiratory failure with hypoxia (HCC) 01/17/2020   Right hip pain 01/10/2020   Leukocytosis 11/15/2019   Wound of buttock 10/25/2019   COVID-19 virus infection 10/04/2019   Urinary frequency 09/26/2019   Cold feeling 09/12/2019   Knee pain 07/04/2019    Hemoptysis 06/30/2019   Chronic diastolic CHF (congestive heart failure) (HCC) 08/10/2018   Femur fracture (HCC) 08/06/2018   Primary osteoarthritis of right shoulder 08/15/2017   Shoulder pain 05/16/2017   Chronic venous insufficiency 02/10/2017   Lymphedema 02/10/2017   Neuropathy 12/25/2016   Anemia due to blood loss, chronic 08/07/2016   GERD (gastroesophageal reflux disease) 12/10/2015   Finger pain 11/13/2015   Carotid artery calcification 09/26/2015   External nasal lesion 09/25/2015   Muscle cramps 09/01/2015   Excessive sweating 07/30/2015   Headache 07/16/2015   Groin pain 05/14/2015   Muscle twitching 03/05/2015   Leg cramps 02/11/2015   Back pain 02/11/2015   Abdominal pain 02/11/2015   Chronic pain 01/19/2015   Acute cystitis without hematuria 12/12/2014   Left elbow pain 11/30/2014   Health care maintenance 11/30/2014   Osteoporosis 10/19/2014   Rectal bleeding 10/19/2014   Neck pain 09/03/2014   Unsteady gait 09/03/2014   Nocturia 09/03/2014   Dysphagia 06/01/2014   Nasal dryness 06/01/2014   Stress 06/01/2014   Fatigue 02/27/2014   Degenerative disc disease 12/21/2013   Pre-op evaluation 10/12/2013   Hoarseness 08/14/2013   Leg swelling 01/24/2013   CHF (congestive heart failure) (HCC) 12/29/2012   Cough 12/29/2012   OSA (obstructive sleep apnea) 07/09/2012   Osteoarthritis 07/04/2012   Symptomatic anemia 07/04/2012   Chronic constipation 07/04/2012   Pulmonary hypertension (HCC) 07/04/2012   Pulmonary nodules 07/04/2012   Hypertension 07/04/2012   Hyperlipidemia 07/04/2012   Hypothyroidism 07/04/2012    PCP: Dale Kenova, MD  REFERRING PROVIDER: Dale Clayton, MD  REFERRING DIAG: M54.2 (ICD-10-CM) - Neck pain  THERAPY DIAG:  Cervicalgia  Muscle weakness (generalized)  Abnormal posture  Rationale for Evaluation and Treatment: Rehabilitation  ONSET DATE: ~9 months ago  SUBJECTIVE:  SUBJECTIVE STATEMENT: Pt report pain in the L arm at today's session 9/10NPS. She states her overall pain is a 6/10NPS.  Hand dominance: Right  PERTINENT HISTORY:  Pt reports having chronic cervical pain for many years. History of cervical surgery in her 30's per pt reports. Pain has worsened gradually over the last several months. Referred pain down her LUE at mid deltoid. Reports pain as dull and achey. Reports having trouble using her LUE on her rollator in weightbearing and L shoulder pain with overhead activities. Denies N/T into her LUE or L hand weakness. Pt does have very kyphotic posture and cervical flexion with difficulty holding her head in a neutral position during subjective reports. Worst pain up to 9/10 NPS, best pain 4-5/10 NPS. Pain currently 6/10 NPS which she reports is her average.   PAIN:  Are you having pain? Yes: NPRS scale: 9/10 Pain location: L upper trap to top of L shoulder  Pain description: Dull and achey Aggravating factors: WB on rollator, overhead use with LUE.  Relieving factors: medications  PRECAUTIONS: None  RED FLAGS: Bowel or bladder incontinence: No, Spinal tumors: No, and Cauda equina syndrome: No     WEIGHT BEARING RESTRICTIONS: No  FALLS:  Has patient fallen in last 6 months? No  LIVING ENVIRONMENT: Lives with: lives with their son Lives in: House/apartment Has following equipment at home: Single point cane, Environmental consultant - 4 wheeled, Tour manager, and bed side commode  OCCUPATION: Retired  PLOF: Independent with transfers, Requires assistive device for independence, Needs assistance with ADLs, and Needs assistance with homemaking  PATIENT GOALS: Improve her pain   NEXT MD VISIT: September 2024  OBJECTIVE:   DIAGNOSTIC FINDINGS:  CLINICAL DATA:  Neck trauma (Age >= 65y)   EXAM: CT  CERVICAL SPINE WITHOUT CONTRAST   TECHNIQUE: Multidetector CT imaging of the cervical spine was performed without intravenous contrast. Multiplanar CT image reconstructions were also generated.   RADIATION DOSE REDUCTION: This exam was performed according to the departmental dose-optimization program which includes automated exposure control, adjustment of the mA and/or kV according to patient size and/or use of iterative reconstruction technique.   COMPARISON:  08/18/2015   FINDINGS: Alignment: Facet joints are aligned without dislocation or traumatic listhesis. Dens and lateral masses are aligned. New grade 1 anterolisthesis of C3 on C4 secondary to progressive degenerative facet arthropathy.   Skull base and vertebrae: No acute fracture. No primary bone lesion or focal pathologic process. Congenital nonunion of the posterior arch of C1.   Soft tissues and spinal canal: No prevertebral fluid or swelling. No visible canal hematoma.   Disc levels: Advanced multilevel degenerative disc disease and facet arthropathy throughout the cervical spine, progressed since 2016. Prominent degenerative pannus formation posterior to the odontoid process resulting in canal narrowing at the C1-C2 level. Multiple additional levels of foraminal and canal stenosis, not well assessed by CT.   Upper chest: Mosaic attenuation of the included lung apices.   Other: Bilateral carotid atherosclerosis.   IMPRESSION: 1. No acute fracture or traumatic listhesis of the cervical spine. 2. Advanced multilevel degenerative disc disease and facet arthropathy throughout the cervical spine, progressed since 2016. 3. Mosaic attenuation of the included lung apices, which can be seen in the setting of small airways disease.     Electronically Signed   By: Duanne Guess D.O.   On: 02/22/2023 10:32  PATIENT SURVEYS:  FOTO 40/48  COGNITION: Overall cognitive status: Within functional limits for tasks  assessed  SENSATION: Light touch: [facility]  POSTURE: rounded shoulders, forward head, decreased lumbar lordosis, increased thoracic kyphosis, and flexed trunk   PALPATION: TTP along L cervical paraspinals and upper trap. Painful to palpate at L biceps tendon.TTP also on bilat pec major.   CERVICAL ROM:   Active ROM A/PROM (deg) eval  Flexion 60  Extension 20*  Right lateral flexion Unable  Left lateral flexion Unable  Right rotation 40  Left rotation 42   (Blank rows = not tested)  UPPER EXTREMITY ROM:  Active ROM Right eval Left eval  Shoulder flexion 124 122*  Shoulder extension    Shoulder abduction    Shoulder adduction    Shoulder extension    Shoulder internal rotation    Shoulder external rotation    Elbow flexion WNL WNL  Elbow extension WNL WNL  Wrist flexion    Wrist extension    Wrist ulnar deviation    Wrist radial deviation    Wrist pronation    Wrist supination     (Blank rows = not tested)  Shoulder ER, flexion, abduction to occiput  Shoulder extension, IR, adduction to T8/bra strap. Painful on L shoulder.   UPPER EXTREMITY MMT:  MMT Right eval Left eval  Shoulder flexion 4* 4*  Shoulder extension    Shoulder abduction 5* 5*  Shoulder adduction    Shoulder extension    Shoulder internal rotation    Shoulder external rotation    Middle trapezius    Lower trapezius    Elbow flexion 4 4*  Elbow extension 5 5  Wrist flexion 5 5  Wrist extension 5 5  Wrist ulnar deviation    Wrist radial deviation    Wrist pronation    Wrist supination    Grip strength Symmetrical Symmetrical   (Blank rows = not tested)  CERVICAL/SHOULDER SPECIAL TESTS:  Open can: positive on LUE     FUNCTIONAL TESTS:  Pt with significant cervical flexor contracture with attempts to lie supine. Unable to tolerate even inclined due to pt quickly reporting dizziness. Thus unable to assess further shoulder joint mobility, PROM  TODAY'S TREATMENT: DATE:  06/17/23  There.Ex: L shoulder w/ heating pack x5 minutes during therex R shoulder flexion to end range w/ 1# DB 3x10 R shoulder bicep curl w/ 1# DB 3 x10 R shoulder abduction w/ 1# DB 2 x12 Seated shoulder rows w/ GTB 3 x12  Seated Shoulder horizontal abduction with GTB, 2 x12  Seated tricep extensions with bilat UE support 3 x6 Seated crossbody punches w/ 1# DB in bilat Ue's 2 x10  Seated cervical extension against wall with ball for resistance behind occiput, 3 x10 Biofreeze application to reduce LUE shoulder and arm pain. Not billed.  PATIENT EDUCATION:  Education details: Prognosis, HEP, POC Person educated: Patient, Child(ren), and Caregiver   Education method: Explanation, Demonstration, Tactile cues, Verbal cues, and Handouts Education comprehension: verbalized understanding, returned demonstration, verbal cues required, tactile cues required, and needs further education  HOME EXERCISE PROGRAM: Access Code: [email or web address] URL: [email or web address] Date: 05/13/2023 Prepared by: Ronnie Derby & Tomasa Hose  Exercises - Seated Scapular Protraction and Retraction with Dowel  - 1 x daily - 7 x weekly - 3 sets - 8 reps - Seated Shoulder Flexion AAROM with Dowel  - 1 x daily - 7 x weekly - 3 sets - 8 reps - Seated Cervical Extension AROM  - 1 x daily - 7 x weekly - 3 sets - 8 reps - Shoulder Rolls in Sitting  - 1 x daily -  7 x weekly - 3 sets - 10 reps - Supine Bicep Curl with Cane  - 1 x daily - 7 x weekly - 3 sets - 10 reps  ASSESSMENT:  CLINICAL IMPRESSION: Session focused on progressing pt's cervico-thoracic mobility and strength. Pt notes increased LUE pain at today's session, however noted mild relief following heat pack application. Pt displays progression in bilat UE strength noted w/ improved activity tolerance to exercises and increased resistance bands utilized. Pt continues to lack cervical extension AROM and strength, future sessions will continue to  emphasize this musculature. Biofreeze applied to pt's LUE to help in reducing pt's shoulder pain and relaxing her muscles all potential contraindications discussed with pt prior to application. Pt would continue to benefit from skilled PT interventions in order to address remaining strength and ROM deficits and improve overall QoL to return to PLOF.     OBJECTIVE IMPAIRMENTS: Abnormal gait, decreased mobility, decreased ROM, decreased strength, hypomobility, impaired flexibility, impaired UE functional use, improper body mechanics, postural dysfunction, and pain.   ACTIVITY LIMITATIONS: carrying, lifting, standing, bathing, toileting, dressing, reach over head, and hygiene/grooming  PARTICIPATION LIMITATIONS:  LUE weightbearing on rollator with gait  PERSONAL FACTORS: Age, Past/current experiences, Time since onset of injury/illness/exacerbation, and 3+ comorbidities: Anemia, CHF, DDD, depression, HLD, pulmonary HTN  are also affecting patient's functional outcome.   REHAB POTENTIAL: Poor Chronicity of symptoms, age, postural and position limitations  CLINICAL DECISION MAKING: Evolving/moderate complexity  EVALUATION COMPLEXITY: Moderate   GOALS: Goals reviewed with patient? No  SHORT TERM GOALS: Target date: 05/13/23  Pt will be compliant with HEP with min to modA from caregiver to address L cervical and shoulder pain and improve posture/cervical mobility Baseline: 04/22/23: provided initial HEP Goal status: MET    LONG TERM GOALS: Target date: 07/17/23  Pt will improve FOTO to target score to demonstrate clinically significant improvement in functional mobility. Baseline: 8/20: 40/48; 06/05/23: 35/48 Goal status: ONGOING  2.  Pt will report on average worst pain 4/10 or less to demonstrate clinically significant reduction in cervical pain.  Baseline: 04/22/23: 6/10 NPS on average worst pain 06/05/23: 6/10 NPS Goal status: ONGOING  3.  Pt will improve cervical extension AROM by at  least 5 degrees to assist in neutral head positioning for decreased neck pain and flexor contracture  Baseline: 04/22/23: 20 degrees 06/05/23: 20 degrees Goal status: ONGOING  4. Pt to improve 5x STS to < 14.8 seconds to display improved functional mobility and LE strength to meet age matched norms.  Baseline: 06/05/23: 19.93 seconds using BUE's on arm rests Goal status: INITIAL     PLAN:  PT FREQUENCY: 1-2x/week  PT DURATION: 6 weeks  PLANNED INTERVENTIONS: Therapeutic exercises, Therapeutic activity, Neuromuscular re-education, Balance training, Gait training, Patient/Family education, Self Care, Joint mobilization, Dry Needling, Spinal mobilization, Moist heat, Manual therapy, and Re-evaluation  PLAN FOR NEXT SESSION: Continue to progress cervico-thoracic strengthening and cervical extension AROM/ strengthening  Lovie Macadamia, SPT  Delphia Grates. Fairly IV, PT, DPT Physical Therapist- [place]  [facility]  06/18/23, 8:44 AM

## 2023-06-19 ENCOUNTER — Ambulatory Visit: Payer: Medicare HMO

## 2023-06-20 ENCOUNTER — Telehealth: Payer: Self-pay | Admitting: Internal Medicine

## 2023-06-20 NOTE — Telephone Encounter (Signed)
Agree with evaluation especially if heart 40s.  Per review, no head injury.  No residual injuries from the fall - fell into the chair.  Can schedule a f/u appt with me.  (Declining more urgent evaluation)

## 2023-06-20 NOTE — Telephone Encounter (Signed)
Daughter called to let Dr Lorin Picket know that patient fell last night. EMT was called, they were concerned on how weak she is and pulse rate was in the 40's, every thing else was fine, EKG was fine.   Daughter thinks it may have been a reaction from the flu shot that was given on  Wednesday, 10/16824. The Area  where vaccine was given had a hard ball around it and patient felt very tired, on Thursday. This morning patient is better, a little tired, not as weak as yesterday and the ball on her arm is gone.

## 2023-06-20 NOTE — Telephone Encounter (Signed)
Called patient to follow up. Discussed with patient and daughter about need for evaluation given fall, heart rate, etc. Pt declined evaluation today. She says that if she felt like she did yesterday when she got up today she would have went to be evaluated but she has had a lot going on this week (currently being treated for UTI, flu shot, therapy, other appts, etc). She is feeling better today. No injury from fall noted. She hit the recliner so it was soft enough to break her fall. Still a little tired. Heart rate has been ranging 65-80 through out the night and all day today. BP running 130s-140s/80s. Will continue to monitor pressures and heart rate. She did agree to go to acute care if any change in condition through out the weekend. Per patient and daughter- The reason for her call was an FYI for you and to inquire if you felt any medication changes were warranted. Do you want to work her in next week to discuss with her? Pt will continue current medications, monitoring vitals and keeping record.

## 2023-06-20 NOTE — Telephone Encounter (Signed)
Patient aware of below. Unable to do appt on Friday. Holding msg for work in appt.

## 2023-06-24 ENCOUNTER — Encounter: Payer: Self-pay | Admitting: Internal Medicine

## 2023-06-24 ENCOUNTER — Ambulatory Visit: Payer: Medicare HMO

## 2023-06-24 ENCOUNTER — Ambulatory Visit: Payer: Medicare HMO | Admitting: Internal Medicine

## 2023-06-24 VITALS — BP 128/64 | HR 56 | Temp 97.9°F | Resp 16 | Ht 62.0 in | Wt 173.3 lb

## 2023-06-24 DIAGNOSIS — W19XXXD Unspecified fall, subsequent encounter: Secondary | ICD-10-CM | POA: Diagnosis not present

## 2023-06-24 DIAGNOSIS — I509 Heart failure, unspecified: Secondary | ICD-10-CM | POA: Diagnosis not present

## 2023-06-24 DIAGNOSIS — R300 Dysuria: Secondary | ICD-10-CM

## 2023-06-24 DIAGNOSIS — I872 Venous insufficiency (chronic) (peripheral): Secondary | ICD-10-CM

## 2023-06-24 DIAGNOSIS — M542 Cervicalgia: Secondary | ICD-10-CM | POA: Diagnosis not present

## 2023-06-24 DIAGNOSIS — I350 Nonrheumatic aortic (valve) stenosis: Secondary | ICD-10-CM | POA: Diagnosis not present

## 2023-06-24 DIAGNOSIS — I10 Essential (primary) hypertension: Secondary | ICD-10-CM

## 2023-06-24 DIAGNOSIS — I700 Atherosclerosis of aorta: Secondary | ICD-10-CM | POA: Diagnosis not present

## 2023-06-24 DIAGNOSIS — M6281 Muscle weakness (generalized): Secondary | ICD-10-CM

## 2023-06-24 DIAGNOSIS — R001 Bradycardia, unspecified: Secondary | ICD-10-CM

## 2023-06-24 DIAGNOSIS — J9611 Chronic respiratory failure with hypoxia: Secondary | ICD-10-CM | POA: Diagnosis not present

## 2023-06-24 DIAGNOSIS — R293 Abnormal posture: Secondary | ICD-10-CM

## 2023-06-24 NOTE — Telephone Encounter (Signed)
Patient just called back and said she will come in today at 1:30. She said she will be here between 1:00 and 1:15.

## 2023-06-24 NOTE — Progress Notes (Addendum)
Subjective:    Patient ID: Kimberly Roy, female    DOB: [birth year], 89 y.o.   MRN: [id number]  Patient here for  Chief Complaint  Patient presents with   Medical Management of Chronic Issues    HPI Work in appt.  Work in - recent fall. Larey Seat 06/19/23.  EMS called.  Reported pulse rate 40s. Was being treated for UTI. Received flu vaccine the day before. Had reported increased fatigue.  Was coming back from going to the bathroom and the walker slid and she fell.  On questioning her, she denied any feelings of dizziness, light headedness, near syncope - prior to the fall.  No head injury.  Called EMS to help her get up.  In reviewing her outside pulse readings, most range between 50-70s.    Past Medical History:  Diagnosis Date   Anemia    Anxiety    Chest pain    CHF (congestive heart failure) (HCC)    Constipation    DDD (degenerative disc disease), cervical    Depression    DVT (deep venous thrombosis) (HCC)    Dysphonia    Dyspnea    Fatty liver    Fatty liver    Headache    Hyperlipidemia    Hyperpiesia    Hypertension    Hypothyroidism    Interstitial cystitis    Left ventricular dysfunction    Lymphedema    Nephrolithiasis    Obstructive sleep apnea    Osteoarthritis    knees/cervical and lumbar spine   Pulmonary hypertension (HCC)    Pulmonary nodules    followed by Dr Meredeth Ide   Pure hypercholesterolemia    Renal cyst    right   Past Surgical History:  Procedure Laterality Date   ABDOMINAL HYSTERECTOMY     ovaries left in place   APPENDECTOMY     Back Surgeries     BACK SURGERY     BREAST REDUCTION SURGERY     3/99   CARDIAC CATHETERIZATION     cataracts Bilateral    CERVICAL SPINE SURGERY     ESOPHAGEAL MANOMETRY N/A 08/02/2015   Procedure: ESOPHAGEAL MANOMETRY (EM);  Surgeon: Elnita Maxwell, MD;  Location: [facility] ENDOSCOPY;  Service: Endoscopy;  Laterality: N/A;   ESOPHAGOGASTRODUODENOSCOPY N/A 02/27/2015   Procedure: ESOPHAGOGASTRODUODENOSCOPY  (EGD);  Surgeon: Wallace Cullens, MD;  Location: [facility] ENDOSCOPY;  Service: Gastroenterology;  Laterality: N/A;   ESOPHAGOGASTRODUODENOSCOPY (EGD) WITH PROPOFOL N/A 10/30/2018   Procedure: ESOPHAGOGASTRODUODENOSCOPY (EGD) WITH PROPOFOL;  Surgeon: Christena Deem, MD;  Location: [facility] ENDOSCOPY;  Service: Endoscopy;  Laterality: N/A;   EXCISIONAL HEMORRHOIDECTOMY     EYE SURGERY     FRACTURE SURGERY     HEMORRHOID SURGERY     HIP SURGERY  2013   Right hip surgery   JOINT REPLACEMENT     KNEE ARTHROSCOPY     left and right   ORIF FEMUR FRACTURE Right 08/07/2018   Procedure: OPEN REDUCTION INTERNAL FIXATION (ORIF) DISTAL FEMUR FRACTURE;  Surgeon: Kennedy Bucker, MD;  Location: [facility] ORS;  Service: Orthopedics;  Laterality: Right;   REDUCTION MAMMAPLASTY Bilateral YRS AGO   REPLACEMENT TOTAL KNEE Bilateral    rotator cuff surgery     blilateral   TONSILECTOMY/ADENOIDECTOMY WITH MYRINGOTOMY     VISCERAL ARTERY INTERVENTION N/A 02/25/2019   Procedure: VISCERAL ARTERY INTERVENTION;  Surgeon: Annice Needy, MD;  Location: [facility] INVASIVE CV LAB;  Service: Cardiovascular;  Laterality: N/A;   Family History  Problem Relation Age  of Onset   Heart disease Mother    Stroke Mother    Hypertension Mother    Heart disease Father        myocardial infarction age 46   Breast cancer Neg Hx    Social History   Socioeconomic History   Marital status: Widowed    Spouse name: Not on file   Number of children: 2   Years of education: 25   Highest education level: Associate degree: occupational, Scientist, product/process development, or vocational program  Occupational History    Comment: Engineer, site  Tobacco Use   Smoking status: Never   Smokeless tobacco: Never  Vaping Use   Vaping status: Never Used  Substance and Sexual Activity   Alcohol use: No    Alcohol/week: 0.0 standard drinks of alcohol   Drug use: Never   Sexual activity: Not Currently  Other Topics Concern   Not on file  Social History Narrative   Not on file    Social Determinants of Health   Financial Resource Strain: Low Risk  (02/13/2023)   Overall Financial Resource Strain (CARDIA)    Difficulty of Paying Living Expenses: Not very hard  Food Insecurity: No Food Insecurity (02/26/2023)   Hunger Vital Sign    Worried About Running Out of Food in the Last Year: Never true    Ran Out of Food in the Last Year: Never true  Transportation Needs: No Transportation Needs (02/26/2023)   PRAPARE - Administrator, Civil Service (Medical): No    Lack of Transportation (Non-Medical): No  Physical Activity: Inactive (02/13/2023)   Exercise Vital Sign    Days of Exercise per Week: 0 days    Minutes of Exercise per Session: 10 min  Stress: No Stress Concern Present (02/13/2023)   [employer] of Occupational Health - Occupational Stress Questionnaire    Feeling of Stress : Only a little  Social Connections: Moderately Isolated (02/13/2023)   Social Connection and Isolation Panel [NHANES]    Frequency of Communication with Friends and Family: More than three times a week    Frequency of Social Gatherings with Friends and Family: Once a week    Attends Religious Services: 1 to 4 times per year    Active Member of [employer] or Organizations: No    Attends Banker Meetings: Never    Marital Status: Widowed     Review of Systems  Constitutional:  Negative for appetite change and unexpected weight change.  HENT:  Negative for congestion and sinus pressure.   Respiratory:  Negative for cough, chest tightness and shortness of breath.   Cardiovascular:  Negative for chest pain and palpitations.  Gastrointestinal:  Negative for abdominal pain, diarrhea, nausea and vomiting.  Genitourinary:  Negative for difficulty urinating and dysuria.  Musculoskeletal:  Negative for myalgias.  Skin:  Negative for color change and rash.  Neurological:  Negative for dizziness and headaches.  Psychiatric/Behavioral:  Negative for agitation and  dysphoric mood.        Objective:     BP 128/64   Pulse (!) 56   Temp 97.9 F (36.6 C)   Resp 16   Ht 5\' 2"  (1.575 m)   Wt 173 lb 4.8 oz (78.6 kg)   SpO2 98%   BMI 31.70 kg/m  Wt Readings from Last 3 Encounters:  06/24/23 173 lb 4.8 oz (78.6 kg)  05/19/23 177 lb (80.3 kg)  05/14/23 172 lb (78 kg)    Physical Exam Vitals reviewed.  Constitutional:  General: She is not in acute distress.    Appearance: Normal appearance.  HENT:     Head: Normocephalic and atraumatic.     Right Ear: External ear normal.     Left Ear: External ear normal.  Eyes:     General: No scleral icterus.       Right eye: No discharge.        Left eye: No discharge.     Conjunctiva/sclera: Conjunctivae normal.  Neck:     Thyroid: No thyromegaly.  Cardiovascular:     Rate and Rhythm: Normal rate and regular rhythm.  Pulmonary:     Effort: No respiratory distress.     Breath sounds: Normal breath sounds. No wheezing.  Abdominal:     General: Bowel sounds are normal.     Palpations: Abdomen is soft.     Tenderness: There is no abdominal tenderness.  Musculoskeletal:        General: No swelling or tenderness.     Cervical back: Neck supple. No tenderness.  Lymphadenopathy:     Cervical: No cervical adenopathy.  Skin:    Findings: No erythema or rash.  Neurological:     Mental Status: She is alert.  Psychiatric:        Mood and Affect: Mood normal.        Behavior: Behavior normal.      Outpatient Encounter Medications as of 06/24/2023  Medication Sig   albuterol (VENTOLIN HFA) 108 (90 Base) MCG/ACT inhaler INHALE 2 PUFFS INTO THE LUNGS EVERY 6 HOURS AS NEEDED FOR WHEEZING OR SHORTNESS OF BREATH   aluminum-magnesium hydroxide-simethicone (MAALOX) 200-200-20 MG/5ML SUSP Take 15 mLs by mouth 4 (four) times daily -  before meals and at bedtime.    amLODipine (NORVASC) 10 MG tablet TAKE 1 TABLET BY MOUTH DAILY   aspirin 81 MG EC tablet Take 81 mg by mouth daily. Every other day    atorvastatin (LIPITOR) 20 MG tablet TAKE 1 TABLET BY MOUTH AT BEDTIME   benazepril (LOTENSIN) 40 MG tablet TAKE 1 TABLET BY MOUTH DAILY   betamethasone dipropionate 0.05 % cream Apply 1 application topically 2 (two) times daily.   budesonide-formoterol (SYMBICORT) 80-4.5 MCG/ACT inhaler Inhale 2 puffs into the lungs in the morning and at bedtime.   busPIRone (BUSPAR) 10 MG tablet Take 10 mg by mouth 3 (three) times daily.   calcium citrate-vitamin D (CITRACAL+D) 315-200 MG-UNIT tablet Take 2 tablets by mouth daily.   Cholecalciferol (VITAMIN D3) 1000 UNITS CAPS Take 1 capsule by mouth daily.   clobetasol cream (TEMOVATE) 0.05 % Apply 1 application topically See admin instructions. Apply to affected areas of body 1 - 2 times daily as needed for itchy bumps. Avoid applying to face, groin, and axilla. Use as directed.   cyanocobalamin (VITAMIN B12) 1000 MCG/ML injection Inject 1 mL (1,000 mcg total) into the muscle every 3 (three) months.   Diclofenac Sodium 3 % GEL Apply topically 2 (two) times daily.   Docusate Sodium (DSS) 100 MG CAPS Take 100 mg by mouth 2 (two) times daily.   DULoxetine (CYMBALTA) 60 MG capsule Take 60 mg by mouth daily.   Fluocinolone Acetonide 0.01 % OIL Apply 1-2 drops into ears once to twice daily as needed.   fluocinonide (LIDEX) 0.05 % external solution Apply topically 2 (two) times daily. Apply drops to scalp for itch   fluticasone (FLONASE) 50 MCG/ACT nasal spray Place 2 sprays into both nostrils daily.   furosemide (LASIX) 20 MG tablet TAKE 1 TABLET  BY MOUTH DAILY AS NEEDED   gabapentin (NEURONTIN) 300 MG capsule Take two tablets tid (in am, midday and q hs).   HYDROcodone-acetaminophen (NORCO) 10-325 MG tablet Take 1 tablet by mouth 3 (three) times daily.   Infant Care Products [facility]) OINT Apply topically as directed (as discussed).   ipratropium (ATROVENT) 0.03 % nasal spray Place 2 sprays into both nostrils every 12 (twelve) hours.   ketoconazole (NIZORAL) 2 %  shampoo Apply 1 Application topically See admin instructions. apply three times per week, massage into scalp and leave in for 10 minutes before rinsing out   levalbuterol (XOPENEX) 0.63 MG/3ML nebulizer solution Take 3 mLs (0.63 mg total) by nebulization every 6 (six) hours as needed for wheezing or shortness of breath.   LINZESS 145 MCG CAPS capsule Take 145 mcg by mouth daily.   magnesium oxide (MAG-OX) 400 MG tablet Take 1 tablet (400 mg total) by mouth daily.   metoprolol succinate (TOPROL-XL) 50 MG 24 hr tablet TAKE 1 TABLET BY MOUTH DAILY. TAKE WITH OR IMMEDIATELY FOLLOWING A MEAL.   morphine (MS CONTIN) 30 MG 12 hr tablet Take 30 mg by mouth every 12 (twelve) hours.   morphine (MS CONTIN) 60 MG 12 hr tablet Take 1 tablet (60 mg total) by mouth 2 (two) times daily.   mupirocin ointment (BACTROBAN) 2 % Apply 1 Application topically 2 (two) times daily.   nystatin (MYCOSTATIN/NYSTOP) powder Apply 1 application. topically 2 (two) times daily as needed.   nystatin cream (MYCOSTATIN) Apply 1 Application topically 2 (two) times daily.   polyethylene glycol powder (GLYCOLAX/MIRALAX) 17 GM/SCOOP powder MIX 17 GRAMS AS MARKED ON BOTTLE TOP IN 8 OUNCES OF WATER AND DRINK ONCE A DAY AS DIRECTED.   RABEprazole (ACIPHEX) 20 MG tablet TAKE 1 TABLET BY MOUTH TWICE A DAY BEFORE A MEAL   Simethicone (GAS-X PO) Take 2 tablets by mouth 2 (two) times daily.   sodium chloride (OCEAN) 0.65 % nasal spray Place 1 spray into the nose as needed.   SYNTHROID 100 MCG tablet TAKE 1 TABLET BY MOUTH DAILY   triamcinolone ointment (KENALOG) 0.1 % APPLY TWICE DAILY TO BITES AND RASH UNTIL FLAT AND SMOOTH **DO NOT APPLY TO FACE**   No facility-administered encounter medications on file as of 06/24/2023.     Lab Results  Component Value Date   WBC 5.6 02/12/2023   HGB 10.5 (L) 02/12/2023   HCT 32.0 (L) 02/12/2023   PLT 133 (L) 02/12/2023   GLUCOSE 85 06/24/2023   CHOL 125 05/19/2023   TRIG 67.0 05/19/2023   HDL  54.00 05/19/2023   LDLCALC 58 05/19/2023   ALT 9 05/19/2023   AST 18 05/19/2023   NA 139 06/24/2023   K 5.1 06/27/2023   CL 102 06/24/2023   CREATININE 0.84 06/24/2023   BUN 19 06/24/2023   CO2 32 06/24/2023   TSH 1.45 05/19/2023   INR 0.89 08/06/2018   HGBA1C 6.0 10/19/2014    DG ESOPHAGUS W SINGLE CM (SOL OR THIN BA)  Result Date: 05/07/2023 CLINICAL DATA:  Dysphagia. Patient reports difficulty swallowing "every once in a while," worse with liquids. Patient also reports severe heartburn. EXAM: ESOPHAGUS/BARIUM SWALLOW/TABLET STUDY TECHNIQUE: Single contrast examination was performed using thin liquid barium. This exam was performed by Mina Marble, PA-C , and was supervised and interpreted by Dr. Leanna Battles. A limited exam was conducted today due to significantly limited patient mobility which limited patient to LPO and AP views. FLUOROSCOPY: Radiation Exposure Index (as provided by  the fluoroscopic device): 17.70 mGy Kerma COMPARISON:  DG UGI W/O KUB 10/03/2015 FINDINGS: Swallowing: Not assessed due to patient condition. Pharynx: Not assessed due to patient condition. Esophagus: Limited assessment due to patient condition. Esophageal motility: Severe esophageal dysmotility visualized with stasis of contrast during single swallow evaluation. Hiatal Hernia: None visualized. Gastroesophageal reflux: Small volume gastroesophageal reflux visualized. Ingested 13mm barium tablet: Not given. Other: Severe kyphosis with the patient's head obscuring the neck and superior mediastinum. IMPRESSION: 1. Limited examination performed due to patient condition. 2. Esophageal dysmotility. 3. Small volume gastroesophageal reflux. Electronically Signed   By: Leanna Battles M.D.   On: 05/07/2023 10:33       Assessment & Plan:  Bradycardia Assessment & Plan: Recent fall as outlined.  It appears on questioning her, the fall occurred due to her walker slipping out from under her. She denied any dizziness,  light headedness or near syncope preceding the fall. EMS called to help her get up and noticed heart rate in the 40s.  EKG today - ventricular rate 61 with PACs.  Will hold on changing metoprolol.  Have her f/u with cardiology to assess if further cardiac w/up warranted, for example zio monitor, etc.    Orders: -     EKG 12-Lead -     Basic metabolic panel  Aortic atherosclerosis (HCC) Assessment & Plan: Continue lipitor.    Aortic valve stenosis, etiology of cardiac valve disease unspecified Assessment & Plan: Followed by cardiology.     Congestive heart failure, unspecified HF chronicity, unspecified heart failure type (HCC) Assessment & Plan: ECHO 60% with G1DD.  No evidence of volume overload on exam.  Continue lasix as directed by cardiology.  Follow metabolic panel.    Chronic respiratory failure with hypoxia (HCC) Assessment & Plan: Uses oxygen at night.  Continue oxygen - nocturnal.       Chronic venous insufficiency Assessment & Plan: Continue compression hose.  Swelling overall improved.    Fall, subsequent encounter Assessment & Plan: Recent fall as outlined.  It appears on questioning her, the fall occurred due to her walker slipping out from under her. She denied any dizziness, light headedness or near syncope preceding the fall. EMS called to help her get up and noticed heart rate in the 40s.  EKG today - ventricular rate 61 with PACs.  Will hold on changing metoprolol.  Have her f/u with cardiology to assess if further cardiac w/up warranted, for example zio monitor, etc.  No residual pain from the fall. No head injury.    Primary hypertension Assessment & Plan: Blood pressure as outlined.  Continue benazepril, amlodipine, lasix and metoprolol.  Will hold on making adjustment in metoprolol dosing. Follow pressures.  Follow metabolic panel.    Dysuria Assessment & Plan: Addended note 06/29/23.  Reviewed phone message.  Change order for urine to be done at  hospital.  See phone message.       Dale Las Flores, MD

## 2023-06-24 NOTE — Telephone Encounter (Signed)
LM for patient to offer her work in appt at 1:30 today. If patient returns call, please schedule her at 1:30 and advise to arrive by 1:00-1:15. If not, will hold for another work in spot.

## 2023-06-24 NOTE — Telephone Encounter (Signed)
Pt scheduled  

## 2023-06-24 NOTE — Therapy (Cosign Needed)
OUTPATIENT PHYSICAL THERAPY CERVICAL TREATMENT   Patient Name: Kimberly Roy MRN: [id number] DOB:Jul 18, 1931, 89 y.o., female Today's Date: 06/25/2023  END OF SESSION:  PT End of Session - 06/24/23 1440     Visit Number 9    Number of Visits 13    Date for PT Re-Evaluation 07/17/23    PT Start Time 1441    PT Stop Time 1514    PT Time Calculation (min) 33 min    Activity Tolerance Patient tolerated treatment well    Behavior During Therapy WFL for tasks assessed/performed              Past Medical History:  Diagnosis Date   Anemia    Anxiety    Chest pain    CHF (congestive heart failure) (HCC)    Constipation    DDD (degenerative disc disease), cervical    Depression    DVT (deep venous thrombosis) (HCC)    Dysphonia    Dyspnea    Fatty liver    Fatty liver    Headache    Hyperlipidemia    Hyperpiesia    Hypertension    Hypothyroidism    Interstitial cystitis    Left ventricular dysfunction    Lymphedema    Nephrolithiasis    Obstructive sleep apnea    Osteoarthritis    knees/cervical and lumbar spine   Pulmonary hypertension (HCC)    Pulmonary nodules    followed by Dr Meredeth Ide   Pure hypercholesterolemia    Renal cyst    right   Past Surgical History:  Procedure Laterality Date   ABDOMINAL HYSTERECTOMY     ovaries left in place   APPENDECTOMY     Back Surgeries     BACK SURGERY     BREAST REDUCTION SURGERY     3/99   CARDIAC CATHETERIZATION     cataracts Bilateral    CERVICAL SPINE SURGERY     ESOPHAGEAL MANOMETRY N/A 08/02/2015   Procedure: ESOPHAGEAL MANOMETRY (EM);  Surgeon: Elnita Maxwell, MD;  Location: [facility] ENDOSCOPY;  Service: Endoscopy;  Laterality: N/A;   ESOPHAGOGASTRODUODENOSCOPY N/A 02/27/2015   Procedure: ESOPHAGOGASTRODUODENOSCOPY (EGD);  Surgeon: Wallace Cullens, MD;  Location: [facility] ENDOSCOPY;  Service: Gastroenterology;  Laterality: N/A;   ESOPHAGOGASTRODUODENOSCOPY (EGD) WITH PROPOFOL N/A 10/30/2018   Procedure:  ESOPHAGOGASTRODUODENOSCOPY (EGD) WITH PROPOFOL;  Surgeon: Christena Deem, MD;  Location: [facility] ENDOSCOPY;  Service: Endoscopy;  Laterality: N/A;   EXCISIONAL HEMORRHOIDECTOMY     EYE SURGERY     FRACTURE SURGERY     HEMORRHOID SURGERY     HIP SURGERY  2013   Right hip surgery   JOINT REPLACEMENT     KNEE ARTHROSCOPY     left and right   ORIF FEMUR FRACTURE Right 08/07/2018   Procedure: OPEN REDUCTION INTERNAL FIXATION (ORIF) DISTAL FEMUR FRACTURE;  Surgeon: Kennedy Bucker, MD;  Location: [facility] ORS;  Service: Orthopedics;  Laterality: Right;   REDUCTION MAMMAPLASTY Bilateral YRS AGO   REPLACEMENT TOTAL KNEE Bilateral    rotator cuff surgery     blilateral   TONSILECTOMY/ADENOIDECTOMY WITH MYRINGOTOMY     VISCERAL ARTERY INTERVENTION N/A 02/25/2019   Procedure: VISCERAL ARTERY INTERVENTION;  Surgeon: Annice Needy, MD;  Location: [facility] INVASIVE CV LAB;  Service: Cardiovascular;  Laterality: N/A;   Patient Active Problem List   Diagnosis Date Noted   Asthma 05/15/2023   Fall 03/15/2023   Difficulty swallowing 02/13/2023   Bartholin cyst 02/11/2023   Abnormal CT of the abdomen 10/27/2022  Trochanteric bursitis, right hip 10/10/2022   Earlobe lesion, right 09/12/2022   Pneumonia 07/12/2022   Hypertensive urgency 07/12/2022   Aortic stenosis 07/12/2022   Soft tissue injury 07/12/2022   Anemia 04/13/2022   B12 deficiency 04/04/2022   Interstitial cystitis 03/28/2022   Iron deficiency 02/28/2022   Right foot pain 01/29/2022   SOB (shortness of breath) 10/14/2021   Rash 03/03/2021   Skin lesion 03/03/2021   Aortic atherosclerosis (HCC) 11/20/2020   Bradycardia 06/08/2020   Itching 04/08/2020   Dyspnea 01/17/2020   Chronic respiratory failure with hypoxia (HCC) 01/17/2020   Right hip pain 01/10/2020   Leukocytosis 11/15/2019   Wound of buttock 10/25/2019   COVID-19 virus infection 10/04/2019   Urinary frequency 09/26/2019   Cold feeling 09/12/2019   Knee pain 07/04/2019    Hemoptysis 06/30/2019   Chronic diastolic CHF (congestive heart failure) (HCC) 08/10/2018   Femur fracture (HCC) 08/06/2018   Primary osteoarthritis of right shoulder 08/15/2017   Shoulder pain 05/16/2017   Chronic venous insufficiency 02/10/2017   Lymphedema 02/10/2017   Neuropathy 12/25/2016   Anemia due to blood loss, chronic 08/07/2016   GERD (gastroesophageal reflux disease) 12/10/2015   Finger pain 11/13/2015   Carotid artery calcification 09/26/2015   External nasal lesion 09/25/2015   Muscle cramps 09/01/2015   Excessive sweating 07/30/2015   Headache 07/16/2015   Groin pain 05/14/2015   Muscle twitching 03/05/2015   Leg cramps 02/11/2015   Back pain 02/11/2015   Abdominal pain 02/11/2015   Chronic pain 01/19/2015   Acute cystitis without hematuria 12/12/2014   Left elbow pain 11/30/2014   Health care maintenance 11/30/2014   Osteoporosis 10/19/2014   Rectal bleeding 10/19/2014   Neck pain 09/03/2014   Unsteady gait 09/03/2014   Nocturia 09/03/2014   Dysphagia 06/01/2014   Nasal dryness 06/01/2014   Stress 06/01/2014   Fatigue 02/27/2014   Degenerative disc disease 12/21/2013   Pre-op evaluation 10/12/2013   Hoarseness 08/14/2013   Leg swelling 01/24/2013   CHF (congestive heart failure) (HCC) 12/29/2012   Cough 12/29/2012   OSA (obstructive sleep apnea) 07/09/2012   Osteoarthritis 07/04/2012   Symptomatic anemia 07/04/2012   Chronic constipation 07/04/2012   Pulmonary hypertension (HCC) 07/04/2012   Pulmonary nodules 07/04/2012   Hypertension 07/04/2012   Hyperlipidemia 07/04/2012   Hypothyroidism 07/04/2012    PCP: Dale Maringouin, MD  REFERRING PROVIDER: Dale [place], MD  REFERRING DIAG: M54.2 (ICD-10-CM) - Neck pain  THERAPY DIAG:  Cervicalgia  Muscle weakness (generalized)  Abnormal posture  Rationale for Evaluation and Treatment: Rehabilitation  ONSET DATE: ~9 months ago  SUBJECTIVE:  SUBJECTIVE STATEMENT: Pt reports bilat shoulder pain as a 6/10NPS. Pt reports that she had a fall earlier this week, however went to see her PCP today and no injuries reported.  Hand dominance: Right  PERTINENT HISTORY:  Pt reports having chronic cervical pain for many years. History of cervical surgery in her 30's per pt reports. Pain has worsened gradually over the last several months. Referred pain down her LUE at mid deltoid. Reports pain as dull and achey. Reports having trouble using her LUE on her rollator in weightbearing and L shoulder pain with overhead activities. Denies N/T into her LUE or L hand weakness. Pt does have very kyphotic posture and cervical flexion with difficulty holding her head in a neutral position during subjective reports. Worst pain up to 9/10 NPS, best pain 4-5/10 NPS. Pain currently 6/10 NPS which she reports is her average.   PAIN:  Are you having pain? Yes: NPRS scale: 6/10 Pain location: L upper trap to top of L shoulder  Pain description: Dull and achey Aggravating factors: WB on rollator, overhead use with LUE.  Relieving factors: medications  PRECAUTIONS: None  RED FLAGS: Bowel or bladder incontinence: No, Spinal tumors: No, and Cauda equina syndrome: No     WEIGHT BEARING RESTRICTIONS: No  FALLS:  Has patient fallen in last 6 months? No  LIVING ENVIRONMENT: Lives with: lives with their son Lives in: House/apartment Has following equipment at home: Single point cane, Environmental consultant - 4 wheeled, Tour manager, and bed side commode  OCCUPATION: Retired  PLOF: Independent with transfers, Requires assistive device for independence, Needs assistance with ADLs, and Needs assistance with homemaking  PATIENT GOALS: Improve her pain   NEXT MD VISIT: September 2024  OBJECTIVE:   DIAGNOSTIC FINDINGS:   CLINICAL DATA:  Neck trauma (Age >= 65y)   EXAM: CT CERVICAL SPINE WITHOUT CONTRAST   TECHNIQUE: Multidetector CT imaging of the cervical spine was performed without intravenous contrast. Multiplanar CT image reconstructions were also generated.   RADIATION DOSE REDUCTION: This exam was performed according to the departmental dose-optimization program which includes automated exposure control, adjustment of the mA and/or kV according to patient size and/or use of iterative reconstruction technique.   COMPARISON:  08/18/2015   FINDINGS: Alignment: Facet joints are aligned without dislocation or traumatic listhesis. Dens and lateral masses are aligned. New grade 1 anterolisthesis of C3 on C4 secondary to progressive degenerative facet arthropathy.   Skull base and vertebrae: No acute fracture. No primary bone lesion or focal pathologic process. Congenital nonunion of the posterior arch of C1.   Soft tissues and spinal canal: No prevertebral fluid or swelling. No visible canal hematoma.   Disc levels: Advanced multilevel degenerative disc disease and facet arthropathy throughout the cervical spine, progressed since 2016. Prominent degenerative pannus formation posterior to the odontoid process resulting in canal narrowing at the C1-C2 level. Multiple additional levels of foraminal and canal stenosis, not well assessed by CT.   Upper chest: Mosaic attenuation of the included lung apices.   Other: Bilateral carotid atherosclerosis.   IMPRESSION: 1. No acute fracture or traumatic listhesis of the cervical spine. 2. Advanced multilevel degenerative disc disease and facet arthropathy throughout the cervical spine, progressed since 2016. 3. Mosaic attenuation of the included lung apices, which can be seen in the setting of small airways disease.     Electronically Signed   By: Duanne Guess D.O.   On: 02/22/2023 10:32  PATIENT SURVEYS:  FOTO  40/48  COGNITION: Overall cognitive status: Within  functional limits for tasks assessed  SENSATION: Light touch: WFL  POSTURE: rounded shoulders, forward head, decreased lumbar lordosis, increased thoracic kyphosis, and flexed trunk   PALPATION: TTP along L cervical paraspinals and upper trap. Painful to palpate at L biceps tendon.TTP also on bilat pec major.   CERVICAL ROM:   Active ROM A/PROM (deg) eval  Flexion 60  Extension 20*  Right lateral flexion Unable  Left lateral flexion Unable  Right rotation 40  Left rotation 42   (Blank rows = not tested)  UPPER EXTREMITY ROM:  Active ROM Right eval Left eval  Shoulder flexion 124 122*  Shoulder extension    Shoulder abduction    Shoulder adduction    Shoulder extension    Shoulder internal rotation    Shoulder external rotation    Elbow flexion WNL WNL  Elbow extension WNL WNL  Wrist flexion    Wrist extension    Wrist ulnar deviation    Wrist radial deviation    Wrist pronation    Wrist supination     (Blank rows = not tested)  Shoulder ER, flexion, abduction to occiput  Shoulder extension, IR, adduction to T8/bra strap. Painful on L shoulder.   UPPER EXTREMITY MMT:  MMT Right eval Left eval  Shoulder flexion 4* 4*  Shoulder extension    Shoulder abduction 5* 5*  Shoulder adduction    Shoulder extension    Shoulder internal rotation    Shoulder external rotation    Middle trapezius    Lower trapezius    Elbow flexion 4 4*  Elbow extension 5 5  Wrist flexion 5 5  Wrist extension 5 5  Wrist ulnar deviation    Wrist radial deviation    Wrist pronation    Wrist supination    Grip strength Symmetrical Symmetrical   (Blank rows = not tested)  CERVICAL/SHOULDER SPECIAL TESTS:  Open can: positive on LUE     FUNCTIONAL TESTS:  Pt with significant cervical flexor contracture with attempts to lie supine. Unable to tolerate even inclined due to pt quickly reporting dizziness. Thus unable to assess  further shoulder joint mobility, PROM  TODAY'S TREATMENT: DATE: 06/24/23  There.Ex: LUE/ RUE shoulder flexion 2 x10/ each side  LUE/RUE shoulder "Y's" 2 x10/ each side  Overhead throws w/ 2KG medicine ball with bilat UE's 2 x12  Seated bicep curls w/ 2# DB in bilat UE's 2 x12 Seated RUE/LUE shoulder abduction w/ 1# DB 2 x12 Seated pec fly's with 2# DB in bilat UE's 2 x12 Seated RUE/LUE tricep extension w/ GTB 2 x10/ each side  PATIENT EDUCATION:  Education details: Prognosis, HEP, POC Person educated: Patient, Child(ren), and Caregiver   Education method: Explanation, Demonstration, Tactile cues, Verbal cues, and Handouts Education comprehension: verbalized understanding, returned demonstration, verbal cues required, tactile cues required, and needs further education  HOME EXERCISE PROGRAM: Access Code: [email or web address] URL: [email or web address] Date: 05/13/2023 Prepared by: Ronnie Derby & Tomasa Hose  Exercises - Seated Scapular Protraction and Retraction with Dowel  - 1 x daily - 7 x weekly - 3 sets - 8 reps - Seated Shoulder Flexion AAROM with Dowel  - 1 x daily - 7 x weekly - 3 sets - 8 reps - Seated Cervical Extension AROM  - 1 x daily - 7 x weekly - 3 sets - 8 reps - Shoulder Rolls in Sitting  - 1 x daily - 7 x weekly - 3 sets - 10 reps - Supine Bicep Curl with Cane  -  1 x daily - 7 x weekly - 3 sets - 10 reps  ASSESSMENT:  CLINICAL IMPRESSION: Pt arriving 11 minutes late to appt today. Session focused on progressing pt's cervico-thoracic mobility and strength. Pt notes decreased irritability of sx at today's session, allowing progression of bilat UE exercises (see above). Pt notes improvements in her cervico- thoracic strength noted with improved ability to hold her in neutral with activity. Pt continues to have intermittent L shoulder> R shoulder pain with exercise needing rest breaks following activity. Pt would continue to benefit from skilled PT interventions  in order to address remaining strength and ROM deficits and improve overall QoL to return to PLOF.    OBJECTIVE IMPAIRMENTS: Abnormal gait, decreased mobility, decreased ROM, decreased strength, hypomobility, impaired flexibility, impaired UE functional use, improper body mechanics, postural dysfunction, and pain.   ACTIVITY LIMITATIONS: carrying, lifting, standing, bathing, toileting, dressing, reach over head, and hygiene/grooming  PARTICIPATION LIMITATIONS:  LUE weightbearing on rollator with gait  PERSONAL FACTORS: Age, Past/current experiences, Time since onset of injury/illness/exacerbation, and 3+ comorbidities: Anemia, CHF, DDD, depression, HLD, pulmonary HTN  are also affecting patient's functional outcome.   REHAB POTENTIAL: Poor Chronicity of symptoms, age, postural and position limitations  CLINICAL DECISION MAKING: Evolving/moderate complexity  EVALUATION COMPLEXITY: Moderate   GOALS: Goals reviewed with patient? No  SHORT TERM GOALS: Target date: 05/13/23  Pt will be compliant with HEP with min to modA from caregiver to address L cervical and shoulder pain and improve posture/cervical mobility Baseline: 04/22/23: provided initial HEP Goal status: MET    LONG TERM GOALS: Target date: 07/17/23  Pt will improve FOTO to target score to demonstrate clinically significant improvement in functional mobility. Baseline: 8/20: 40/48; 06/05/23: 35/48 Goal status: ONGOING  2.  Pt will report on average worst pain 4/10 or less to demonstrate clinically significant reduction in cervical pain.  Baseline: 04/22/23: 6/10 NPS on average worst pain 06/05/23: 6/10 NPS Goal status: ONGOING  3.  Pt will improve cervical extension AROM by at least 5 degrees to assist in neutral head positioning for decreased neck pain and flexor contracture  Baseline: 04/22/23: 20 degrees 06/05/23: 20 degrees Goal status: ONGOING  4. Pt to improve 5x STS to < 14.8 seconds to display improved functional  mobility and LE strength to meet age matched norms.  Baseline: 06/05/23: 19.93 seconds using BUE's on arm rests Goal status: INITIAL     PLAN:  PT FREQUENCY: 1-2x/week  PT DURATION: 6 weeks  PLANNED INTERVENTIONS: Therapeutic exercises, Therapeutic activity, Neuromuscular re-education, Balance training, Gait training, Patient/Family education, Self Care, Joint mobilization, Dry Needling, Spinal mobilization, Moist heat, Manual therapy, and Re-evaluation  PLAN FOR NEXT SESSION: PROGRESS NOTE, Continue to progress cervico-thoracic strengthening and cervical extension AROM/ strengthening  Lovie Macadamia, SPT  Delphia Grates. Fairly IV, PT, DPT Physical Therapist- [facility]  06/25/23, 9:14 AM

## 2023-06-25 LAB — BASIC METABOLIC PANEL
BUN: 19 mg/dL (ref 6–23)
CO2: 32 meq/L (ref 19–32)
Calcium: 9.9 mg/dL (ref 8.4–10.5)
Chloride: 102 meq/L (ref 96–112)
Creatinine, Ser: 0.84 mg/dL (ref 0.40–1.20)
GFR: 61.23 mL/min (ref >60.00)
Glucose, Bld: 85 mg/dL (ref 70–99)
Potassium: 5.5 meq/L — ABNORMAL HIGH (ref 3.5–5.1)
Sodium: 139 meq/L (ref 135–145)

## 2023-06-26 ENCOUNTER — Other Ambulatory Visit: Payer: Self-pay

## 2023-06-26 ENCOUNTER — Ambulatory Visit: Payer: Medicare HMO

## 2023-06-26 DIAGNOSIS — E875 Hyperkalemia: Secondary | ICD-10-CM

## 2023-06-27 ENCOUNTER — Telehealth: Payer: Self-pay | Admitting: Internal Medicine

## 2023-06-27 ENCOUNTER — Other Ambulatory Visit
Admission: RE | Admit: 2023-06-27 | Discharge: 2023-06-27 | Disposition: A | Discharge status: Home / Self Care | Payer: Medicare HMO | Attending: Internal Medicine | Admitting: Internal Medicine

## 2023-06-27 DIAGNOSIS — R300 Dysuria: Secondary | ICD-10-CM

## 2023-06-27 DIAGNOSIS — E875 Hyperkalemia: Secondary | ICD-10-CM | POA: Insufficient documentation

## 2023-06-27 LAB — POTASSIUM: Potassium: 5.1 mmol/L (ref 3.5–5.1)

## 2023-06-27 NOTE — Telephone Encounter (Signed)
LMTCB. Need to know what kind of symptoms she is having.

## 2023-06-27 NOTE — Telephone Encounter (Signed)
Pt went to [facility] about 10 days ago & was treated with an abx, still having pain, urgency, & burning. She talked to them again this morning & they wanted to see if she could repeat a urine.   I have place a urine order for her to have a urine checked over the weekend att he hospital. Pt aware if sx's worsen that she needs to be evaluated at [facility] or ED.

## 2023-06-27 NOTE — Telephone Encounter (Signed)
Patient called and states it was ok to have [facility] drawn [facility], she just would like a urine sample order added as she feel she may have a uti.

## 2023-06-28 NOTE — Telephone Encounter (Signed)
Noted.  Hold message to confirm f/u regarding urine.

## 2023-06-29 ENCOUNTER — Encounter: Payer: Self-pay | Admitting: Internal Medicine

## 2023-06-29 DIAGNOSIS — R300 Dysuria: Secondary | ICD-10-CM | POA: Insufficient documentation

## 2023-06-29 NOTE — Assessment & Plan Note (Signed)
Followed by cardiology 

## 2023-06-29 NOTE — Assessment & Plan Note (Signed)
Uses oxygen at night.  Continue oxygen - nocturnal.     

## 2023-06-29 NOTE — Assessment & Plan Note (Signed)
Blood pressure as outlined.  Continue benazepril, amlodipine, lasix and metoprolol.  Will hold on making adjustment in metoprolol dosing. Follow pressures.  Follow metabolic panel.

## 2023-06-29 NOTE — Assessment & Plan Note (Signed)
Continue lipitor  ?

## 2023-06-29 NOTE — Assessment & Plan Note (Signed)
Continue compression hose.  Swelling overall improved.

## 2023-06-29 NOTE — Assessment & Plan Note (Signed)
Recent fall as outlined.  It appears on questioning her, the fall occurred due to her walker slipping out from under her. She denied any dizziness, light headedness or near syncope preceding the fall. EMS called to help her get up and noticed heart rate in the 40s.  EKG today - ventricular rate 61 with PACs.  Will hold on changing metoprolol.  Have her f/u with cardiology to assess if further cardiac w/up warranted, for example zio monitor, etc.

## 2023-06-29 NOTE — Addendum Note (Signed)
Addended by: Charm Barges on: 06/29/2023 03:31 PM   Modules accepted: Orders

## 2023-06-29 NOTE — Assessment & Plan Note (Signed)
Recent fall as outlined.  It appears on questioning her, the fall occurred due to her walker slipping out from under her. She denied any dizziness, light headedness or near syncope preceding the fall. EMS called to help her get up and noticed heart rate in the 40s.  EKG today - ventricular rate 61 with PACs.  Will hold on changing metoprolol.  Have her f/u with cardiology to assess if further cardiac w/up warranted, for example zio monitor, etc.  No residual pain from the fall. No head injury.

## 2023-06-29 NOTE — Assessment & Plan Note (Signed)
ECHO 60% with G1DD.  No evidence of volume overload on exam.  Continue lasix as directed by cardiology.  Follow metabolic panel.

## 2023-06-29 NOTE — Assessment & Plan Note (Signed)
Addended note 06/29/23.  Reviewed phone message.  Change order for urine to be done at hospital.  See phone message.

## 2023-06-30 ENCOUNTER — Other Ambulatory Visit
Admission: RE | Admit: 2023-06-30 | Discharge: 2023-06-30 | Disposition: A | Discharge status: Home / Self Care | Payer: Medicare HMO | Attending: Internal Medicine | Admitting: Internal Medicine

## 2023-06-30 DIAGNOSIS — R300 Dysuria: Secondary | ICD-10-CM | POA: Diagnosis not present

## 2023-06-30 DIAGNOSIS — M47812 Spondylosis without myelopathy or radiculopathy, cervical region: Secondary | ICD-10-CM | POA: Diagnosis not present

## 2023-06-30 LAB — URINALYSIS, ROUTINE W REFLEX MICROSCOPIC
Bilirubin Urine: NEGATIVE
Glucose, UA: NEGATIVE mg/dL
Hgb urine dipstick: NEGATIVE
Ketones, ur: NEGATIVE mg/dL
Nitrite: NEGATIVE
Protein, ur: NEGATIVE mg/dL
Specific Gravity, Urine: 1.016 (ref 1.005–1.030)
pH: 7 (ref 5.0–8.0)

## 2023-06-30 NOTE — Telephone Encounter (Signed)
Called patient to follow up. She is going to leave urine sample today. Did not have transportation this weekend

## 2023-07-01 ENCOUNTER — Ambulatory Visit: Payer: Medicare HMO

## 2023-07-01 DIAGNOSIS — M542 Cervicalgia: Secondary | ICD-10-CM

## 2023-07-01 DIAGNOSIS — M6281 Muscle weakness (generalized): Secondary | ICD-10-CM

## 2023-07-01 DIAGNOSIS — R293 Abnormal posture: Secondary | ICD-10-CM | POA: Diagnosis not present

## 2023-07-01 LAB — URINE CULTURE: Culture: 60000 — AB

## 2023-07-01 NOTE — Therapy (Addendum)
OUTPATIENT PHYSICAL THERAPY CERVICAL TREATMENT/ PROGRESS NOTE Dates: 04/22/2023- 07/01/2023   Patient Name: Kimberly Roy MRN: [id number] DOB:[birth year], 89 y.o., female Today's Date: 07/01/2023  END OF SESSION:  PT End of Session - 07/01/23 1433     Visit Number 10    Number of Visits 13    Date for PT Re-Evaluation 07/17/23    PT Start Time 1433    PT Stop Time 1511    PT Time Calculation (min) 38 min    Activity Tolerance Patient tolerated treatment well    Behavior During Therapy WFL for tasks assessed/performed              Past Medical History:  Diagnosis Date   Anemia    Anxiety    Chest pain    CHF (congestive heart failure) (HCC)    Constipation    DDD (degenerative disc disease), cervical    Depression    DVT (deep venous thrombosis) (HCC)    Dysphonia    Dyspnea    Fatty liver    Fatty liver    Headache    Hyperlipidemia    Hyperpiesia    Hypertension    Hypothyroidism    Interstitial cystitis    Left ventricular dysfunction    Lymphedema    Nephrolithiasis    Obstructive sleep apnea    Osteoarthritis    knees/cervical and lumbar spine   Pulmonary hypertension (HCC)    Pulmonary nodules    followed by Dr Meredeth Ide   Pure hypercholesterolemia    Renal cyst    right   Past Surgical History:  Procedure Laterality Date   ABDOMINAL HYSTERECTOMY     ovaries left in place   APPENDECTOMY     Back Surgeries     BACK SURGERY     BREAST REDUCTION SURGERY     3/99   CARDIAC CATHETERIZATION     cataracts Bilateral    CERVICAL SPINE SURGERY     ESOPHAGEAL MANOMETRY N/A 08/02/2015   Procedure: ESOPHAGEAL MANOMETRY (EM);  Surgeon: Elnita Maxwell, MD;  Location[facility] ENDOSCOPY;  Service: Endoscopy;  Laterality: N/A;   ESOPHAGOGASTRODUODENOSCOPY N/A 02/27/2015   Procedure: ESOPHAGOGASTRODUODENOSCOPY (EGD);  Surgeon: Wallace Cullens, MD;  Location: [facility] ENDOSCOPY;  Service: Gastroenterology;  Laterality: N/A;   ESOPHAGOGASTRODUODENOSCOPY (EGD) WITH  PROPOFOL N/A 10/30/2018   Procedure: ESOPHAGOGASTRODUODENOSCOPY (EGD) WITH PROPOFOL;  Surgeon: Christena Deem, MD;  Location: [facility] ENDOSCOPY;  Service: Endoscopy;  Laterality: N/A;   EXCISIONAL HEMORRHOIDECTOMY     EYE SURGERY     FRACTURE SURGERY     HEMORRHOID SURGERY     HIP SURGERY  2013   Right hip surgery   JOINT REPLACEMENT     KNEE ARTHROSCOPY     left and right   ORIF FEMUR FRACTURE Right 08/07/2018   Procedure: OPEN REDUCTION INTERNAL FIXATION (ORIF) DISTAL FEMUR FRACTURE;  Surgeon: Kennedy Bucker, MD;  Location: [facility] ORS;  Service: Orthopedics;  Laterality: Right;   REDUCTION MAMMAPLASTY Bilateral YRS AGO   REPLACEMENT TOTAL KNEE Bilateral    rotator cuff surgery     blilateral   TONSILECTOMY/ADENOIDECTOMY WITH MYRINGOTOMY     VISCERAL ARTERY INTERVENTION N/A 02/25/2019   Procedure: VISCERAL ARTERY INTERVENTION;  Surgeon: Annice Needy, MD;  Location: [facility] INVASIVE CV LAB;  Service: Cardiovascular;  Laterality: N/A;   Patient Active Problem List   Diagnosis Date Noted   Dysuria 06/29/2023   Asthma 05/15/2023   Fall 03/15/2023   Difficulty swallowing 02/13/2023   Bartholin cyst  02/11/2023   Abnormal CT of the abdomen 10/27/2022   Trochanteric bursitis, right hip 10/10/2022   Earlobe lesion, right 09/12/2022   Pneumonia 07/12/2022   Hypertensive urgency 07/12/2022   Aortic stenosis 07/12/2022   Soft tissue injury 07/12/2022   Anemia 04/13/2022   B12 deficiency 04/04/2022   Interstitial cystitis 03/28/2022   Iron deficiency 02/28/2022   Right foot pain 01/29/2022   SOB (shortness of breath) 10/14/2021   Rash 03/03/2021   Skin lesion 03/03/2021   Aortic atherosclerosis (HCC) 11/20/2020   Bradycardia 06/08/2020   Itching 04/08/2020   Dyspnea 01/17/2020   Chronic respiratory failure with hypoxia (HCC) 01/17/2020   Right hip pain 01/10/2020   Leukocytosis 11/15/2019   Wound of buttock 10/25/2019   COVID-19 virus infection 10/04/2019   Urinary frequency  09/26/2019   Cold feeling 09/12/2019   Knee pain 07/04/2019   Hemoptysis 06/30/2019   Chronic diastolic CHF (congestive heart failure) (HCC) 08/10/2018   Femur fracture (HCC) 08/06/2018   Primary osteoarthritis of right shoulder 08/15/2017   Shoulder pain 05/16/2017   Chronic venous insufficiency 02/10/2017   Lymphedema 02/10/2017   Neuropathy 12/25/2016   Anemia due to blood loss, chronic 08/07/2016   GERD (gastroesophageal reflux disease) 12/10/2015   Finger pain 11/13/2015   Carotid artery calcification 09/26/2015   External nasal lesion 09/25/2015   Muscle cramps 09/01/2015   Excessive sweating 07/30/2015   Headache 07/16/2015   Groin pain 05/14/2015   Muscle twitching 03/05/2015   Leg cramps 02/11/2015   Back pain 02/11/2015   Abdominal pain 02/11/2015   Chronic pain 01/19/2015   Acute cystitis without hematuria 12/12/2014   Left elbow pain 11/30/2014   Health care maintenance 11/30/2014   Osteoporosis 10/19/2014   Rectal bleeding 10/19/2014   Neck pain 09/03/2014   Unsteady gait 09/03/2014   Nocturia 09/03/2014   Dysphagia 06/01/2014   Nasal dryness 06/01/2014   Stress 06/01/2014   Fatigue 02/27/2014   Degenerative disc disease 12/21/2013   Pre-op evaluation 10/12/2013   Hoarseness 08/14/2013   Leg swelling 01/24/2013   CHF (congestive heart failure) (HCC) 12/29/2012   Cough 12/29/2012   OSA (obstructive sleep apnea) 07/09/2012   Osteoarthritis 07/04/2012   Symptomatic anemia 07/04/2012   Chronic constipation 07/04/2012   Pulmonary hypertension (HCC) 07/04/2012   Pulmonary nodules 07/04/2012   Hypertension 07/04/2012   Hyperlipidemia 07/04/2012   Hypothyroidism 07/04/2012    PCP: Dale Willows, MD  REFERRING PROVIDER: Dale , MD  REFERRING DIAG: M54.2 (ICD-10-CM) - Neck pain  THERAPY DIAG:  Cervicalgia  Muscle weakness (generalized)  Rationale for Evaluation and Treatment: Rehabilitation  ONSET DATE: ~9 months ago  SUBJECTIVE:  SUBJECTIVE STATEMENT: Pt reports 2/10 pain in the neck, and bilat shoulder pain as a 6/10NPS. Pt reports decreased pain following nerve block.   Hand dominance: Right  PERTINENT HISTORY:  Pt reports having chronic cervical pain for many years. History of cervical surgery in her 30's per pt reports. Pain has worsened gradually over the last several months. Referred pain down her LUE at mid deltoid. Reports pain as dull and achey. Reports having trouble using her LUE on her rollator in weightbearing and L shoulder pain with overhead activities. Denies N/T into her LUE or L hand weakness. Pt does have very kyphotic posture and cervical flexion with difficulty holding her head in a neutral position during subjective reports. Worst pain up to 9/10 NPS, best pain 4-5/10 NPS. Pain currently 6/10 NPS which she reports is her average.   PAIN:  Are you having pain? Yes: NPRS scale: 2/10 Pain location: L upper trap to top of L shoulder  Pain description: Dull and achey Aggravating factors: WB on rollator, overhead use with LUE.  Relieving factors: medications  PRECAUTIONS: None  RED FLAGS: Bowel or bladder incontinence: No, Spinal tumors: No, and Cauda equina syndrome: No     WEIGHT BEARING RESTRICTIONS: No  FALLS:  Has patient fallen in last 6 months? No  LIVING ENVIRONMENT: Lives with: lives with their son Lives in: House/apartment Has following equipment at home: Single point cane, Environmental consultant - 4 wheeled, Tour manager, and bed side commode  OCCUPATION: Retired  PLOF: Independent with transfers, Requires assistive device for independence, Needs assistance with ADLs, and Needs assistance with homemaking  PATIENT GOALS: Improve her pain   NEXT MD VISIT: September 2024  OBJECTIVE:   DIAGNOSTIC  FINDINGS:  CLINICAL DATA:  Neck trauma (Age >= 65y)   EXAM: CT CERVICAL SPINE WITHOUT CONTRAST   TECHNIQUE: Multidetector CT imaging of the cervical spine was performed without intravenous contrast. Multiplanar CT image reconstructions were also generated.   RADIATION DOSE REDUCTION: This exam was performed according to the departmental dose-optimization program which includes automated exposure control, adjustment of the mA and/or kV according to patient size and/or use of iterative reconstruction technique.   COMPARISON:  08/18/2015   FINDINGS: Alignment: Facet joints are aligned without dislocation or traumatic listhesis. Dens and lateral masses are aligned. New grade 1 anterolisthesis of C3 on C4 secondary to progressive degenerative facet arthropathy.   Skull base and vertebrae: No acute fracture. No primary bone lesion or focal pathologic process. Congenital nonunion of the posterior arch of C1.   Soft tissues and spinal canal: No prevertebral fluid or swelling. No visible canal hematoma.   Disc levels: Advanced multilevel degenerative disc disease and facet arthropathy throughout the cervical spine, progressed since 2016. Prominent degenerative pannus formation posterior to the odontoid process resulting in canal narrowing at the C1-C2 level. Multiple additional levels of foraminal and canal stenosis, not well assessed by CT.   Upper chest: Mosaic attenuation of the included lung apices.   Other: Bilateral carotid atherosclerosis.   IMPRESSION: 1. No acute fracture or traumatic listhesis of the cervical spine. 2. Advanced multilevel degenerative disc disease and facet arthropathy throughout the cervical spine, progressed since 2016. 3. Mosaic attenuation of the included lung apices, which can be seen in the setting of small airways disease.     Electronically Signed   By: Duanne Guess D.O.   On: 02/22/2023 10:32  PATIENT SURVEYS:  FOTO  40/48  COGNITION: Overall cognitive status: Within functional limits for tasks assessed  SENSATION:  Light touch: WFL  POSTURE: rounded shoulders, forward head, decreased lumbar lordosis, increased thoracic kyphosis, and flexed trunk   PALPATION: TTP along L cervical paraspinals and upper trap. Painful to palpate at L biceps tendon.TTP also on bilat pec major.   CERVICAL ROM:   Active ROM A/PROM (deg) eval  Flexion 60  Extension 20*  Right lateral flexion Unable  Left lateral flexion Unable  Right rotation 40  Left rotation 42   (Blank rows = not tested)  UPPER EXTREMITY ROM:  Active ROM Right eval Left eval  Shoulder flexion 124 122*  Shoulder extension    Shoulder abduction    Shoulder adduction    Shoulder extension    Shoulder internal rotation    Shoulder external rotation    Elbow flexion WNL WNL  Elbow extension WNL WNL  Wrist flexion    Wrist extension    Wrist ulnar deviation    Wrist radial deviation    Wrist pronation    Wrist supination     (Blank rows = not tested)  Shoulder ER, flexion, abduction to occiput  Shoulder extension, IR, adduction to T8/bra strap. Painful on L shoulder.   UPPER EXTREMITY MMT:  MMT Right eval Left eval  Shoulder flexion 4* 4*  Shoulder extension    Shoulder abduction 5* 5*  Shoulder adduction    Shoulder extension    Shoulder internal rotation    Shoulder external rotation    Middle trapezius    Lower trapezius    Elbow flexion 4 4*  Elbow extension 5 5  Wrist flexion 5 5  Wrist extension 5 5  Wrist ulnar deviation    Wrist radial deviation    Wrist pronation    Wrist supination    Grip strength Symmetrical Symmetrical   (Blank rows = not tested)  CERVICAL/SHOULDER SPECIAL TESTS:  Open can: positive on LUE     FUNCTIONAL TESTS:  Pt with significant cervical flexor contracture with attempts to lie supine. Unable to tolerate even inclined due to pt quickly reporting dizziness. Thus unable to assess  further shoulder joint mobility, PROM  TODAY'S TREATMENT: DATE: 07/01/23  Beginning of session spent reviewing goals a pt requiring progress note. See clinical impression and goals section for details.    There.Ex: Seated horizontal abduction w/ GTB 2 x10  Seated bicep curls w/ GTB RUE/LUE x10/ each side Seated shoulder flexion w/ dowel 2 x10  Seated shoulder rows w/ GTB 3 x10   PATIENT EDUCATION:  Education details: Prognosis, HEP, POC Person educated: Patient, Child(ren), and Caregiver   Education method: Explanation, Demonstration, Tactile cues, Verbal cues, and Handouts Education comprehension: verbalized understanding, returned demonstration, verbal cues required, tactile cues required, and needs further education  HOME EXERCISE PROGRAM: Access Code: [email or web address] URL: [email or web address] Date: 07/01/2023 Prepared by: Ronnie Derby  Exercises - Seated Shoulder Flexion AAROM with Dowel  - 1 x daily - 7 x weekly - 3 sets - 8 reps - Seated Cervical Extension AROM  - 1 x daily - 7 x weekly - 3 sets - 8 reps - Shoulder Rolls in Sitting  - 1 x daily - 7 x weekly - 3 sets - 10 reps - Sit to Stand with Armchair  - 1 x daily - 2-3 x weekly - 2 sets - 6-8 reps - Standing Shoulder Horizontal Abduction with Resistance  - 1 x daily - 2-3 x weekly - 3 sets - 8 reps - Seated Bicep Curls Supinated with Dumbbells  - 1 x daily -  2-3 x weekly - 3 sets - 8 reps - Scapular Retraction with Resistance  - 1 x daily - 2-3 x weekly - 3 sets - 8 reps  ASSESSMENT:  CLINICAL IMPRESSION: Session focused on reviewing pt's STG and LTG's as pt requiring progress note at 10th visit. Notes achieving 4/5 of her STG and LTG's including HEP compliance, meeting her FOTO score, improving her cervical extension AROM, and exceeding her 5xSTS time. Achieving these goals displays improvements in pt's, strength and AROM. However, pt continues to note bilat shoulder and cervical pain that is limiting pt's  functional mobility and ability to perform ADL's. PT and pt agree that pt would continue to benefit from skilled PT interventions 1x/ week to address remaining strength and ROM deficits and improve overall QoL to return to PLOF.    OBJECTIVE IMPAIRMENTS: Abnormal gait, decreased mobility, decreased ROM, decreased strength, hypomobility, impaired flexibility, impaired UE functional use, improper body mechanics, postural dysfunction, and pain.   ACTIVITY LIMITATIONS: carrying, lifting, standing, bathing, toileting, dressing, reach over head, and hygiene/grooming  PARTICIPATION LIMITATIONS:  LUE weightbearing on rollator with gait  PERSONAL FACTORS: Age, Past/current experiences, Time since onset of injury/illness/exacerbation, and 3+ comorbidities: Anemia, CHF, DDD, depression, HLD, pulmonary HTN  are also affecting patient's functional outcome.   REHAB POTENTIAL: Poor Chronicity of symptoms, age, postural and position limitations  CLINICAL DECISION MAKING: Evolving/moderate complexity  EVALUATION COMPLEXITY: Moderate   GOALS: Goals reviewed with patient? No  SHORT TERM GOALS: Target date: 05/13/23  Pt will be compliant with HEP with min to modA from caregiver to address L cervical and shoulder pain and improve posture/cervical mobility Baseline: 04/22/23: provided initial HEP Goal status: MET    LONG TERM GOALS: Target date: 07/17/23  Pt will improve FOTO to target score to demonstrate clinically significant improvement in functional mobility. Baseline: 8/20: 40/48; 06/05/23: 35/48 06/30/28: 48/48 Goal status: MET  2.  Pt will report on average worst pain 4/10 or less to demonstrate clinically significant reduction in cervical pain.  Baseline: 04/22/23: 6/10 NPS on average worst pain 06/05/23: 6/10 NPS 07/01/23: 6/10 NPS Goal status: ONGOING  3.  Pt will improve cervical extension AROM by at least 5 degrees to assist in neutral head positioning for decreased neck pain and flexor  contracture  Baseline: 04/22/23: 20 degrees 06/05/23: 20 degrees 07/01/23: 35 degrees  Goal status: MET  4. Pt to improve 5x STS to < 14.8 seconds to display improved functional mobility and LE strength to meet age matched norms.  Baseline: 06/05/23: 19.93 seconds using BUE's on arm rests 07/01/23: 14.31 seconds using BUE's on arm rests Goal status: MET     PLAN:  PT FREQUENCY: 1-2x/week  PT DURATION: 6 weeks  PLANNED INTERVENTIONS: Therapeutic exercises, Therapeutic activity, Neuromuscular re-education, Balance training, Gait training, Patient/Family education, Self Care, Joint mobilization, Dry Needling, Spinal mobilization, Moist heat, Manual therapy, and Re-evaluation  PLAN FOR NEXT SESSION: DASH, Assess tolerance to new exercises added to HEP, Continue to progress cervico-thoracic strengthening and cervical extension AROM/ strengthening  Lovie Macadamia, SPT  Delphia Grates. Fairly IV, PT, DPT Physical Therapist- [facility]  07/01/23, 4:18 PM

## 2023-07-02 ENCOUNTER — Other Ambulatory Visit: Payer: Self-pay

## 2023-07-02 DIAGNOSIS — R1310 Dysphagia, unspecified: Secondary | ICD-10-CM | POA: Diagnosis not present

## 2023-07-02 DIAGNOSIS — F5105 Insomnia due to other mental disorder: Secondary | ICD-10-CM | POA: Diagnosis not present

## 2023-07-02 DIAGNOSIS — F330 Major depressive disorder, recurrent, mild: Secondary | ICD-10-CM | POA: Diagnosis not present

## 2023-07-02 DIAGNOSIS — K219 Gastro-esophageal reflux disease without esophagitis: Secondary | ICD-10-CM | POA: Diagnosis not present

## 2023-07-02 DIAGNOSIS — R300 Dysuria: Secondary | ICD-10-CM | POA: Diagnosis not present

## 2023-07-02 DIAGNOSIS — F419 Anxiety disorder, unspecified: Secondary | ICD-10-CM | POA: Diagnosis not present

## 2023-07-02 LAB — URINALYSIS, ROUTINE W REFLEX MICROSCOPIC
Bilirubin Urine: NEGATIVE
Hgb urine dipstick: NEGATIVE
Ketones, ur: NEGATIVE
Nitrite: POSITIVE — AB
Specific Gravity, Urine: 1.015 (ref 1.000–1.030)
Total Protein, Urine: NEGATIVE
Urine Glucose: NEGATIVE
Urobilinogen, UA: 0.2 (ref 0.0–1.0)
pH: 7 (ref 5.0–8.0)

## 2023-07-03 ENCOUNTER — Ambulatory Visit: Payer: Medicare HMO

## 2023-07-03 ENCOUNTER — Telehealth: Payer: Self-pay | Admitting: Internal Medicine

## 2023-07-03 LAB — URINE CULTURE
MICRO NUMBER:: 15664072
SPECIMEN QUALITY:: ADEQUATE

## 2023-07-03 NOTE — Telephone Encounter (Signed)
Pt called stating the Augmentin is not at the pharmacy

## 2023-07-03 NOTE — Telephone Encounter (Signed)
Error

## 2023-07-04 ENCOUNTER — Other Ambulatory Visit: Payer: Self-pay

## 2023-07-04 NOTE — Telephone Encounter (Signed)
See below. Abx not needed.

## 2023-07-08 ENCOUNTER — Ambulatory Visit: Payer: Medicare HMO

## 2023-07-10 ENCOUNTER — Telehealth: Payer: Self-pay | Admitting: Internal Medicine

## 2023-07-10 NOTE — Telephone Encounter (Signed)
Pt is supposed to call back and let me know if she can come in tomorrow at 3:30. Appt spot held. OK to use.

## 2023-07-10 NOTE — Telephone Encounter (Signed)
Urine was checked on 10/30. No infection. Do you want to work her in or refer to urology?;

## 2023-07-10 NOTE — Telephone Encounter (Signed)
LMTCB

## 2023-07-10 NOTE — Telephone Encounter (Signed)
Patient just called and asked can she come in and drop off a urine specimen. She said she is still having symptoms of a UTI. Her number is [phone]. She would like for someone to call her.

## 2023-07-10 NOTE — Telephone Encounter (Signed)
Can evaluate, but if she is having persistent symptoms, will also need urology evaluation.

## 2023-07-10 NOTE — Telephone Encounter (Signed)
Patient states she is returning call from Rita Ohara, LPN.  I was unable to reach [place] at the time of the call.  I let patient know that I will send her a message so she can call her back.  Patient states she is experiencing burning and urgency.

## 2023-07-11 ENCOUNTER — Other Ambulatory Visit (HOSPITAL_COMMUNITY)
Admission: RE | Admit: 2023-07-11 | Discharge: 2023-07-11 | Disposition: A | Discharge status: Home / Self Care | Payer: Medicare HMO | Source: Ambulatory Visit | Attending: Internal Medicine | Admitting: Internal Medicine

## 2023-07-11 ENCOUNTER — Encounter: Payer: Self-pay | Admitting: Internal Medicine

## 2023-07-11 ENCOUNTER — Ambulatory Visit (INDEPENDENT_AMBULATORY_CARE_PROVIDER_SITE_OTHER): Payer: Medicare HMO | Admitting: Internal Medicine

## 2023-07-11 VITALS — BP 136/78 | HR 81 | Temp 98.0°F | Resp 16 | Ht 62.0 in | Wt 171.0 lb

## 2023-07-11 DIAGNOSIS — I10 Essential (primary) hypertension: Secondary | ICD-10-CM

## 2023-07-11 DIAGNOSIS — N898 Other specified noninflammatory disorders of vagina: Secondary | ICD-10-CM | POA: Insufficient documentation

## 2023-07-11 DIAGNOSIS — K219 Gastro-esophageal reflux disease without esophagitis: Secondary | ICD-10-CM

## 2023-07-11 DIAGNOSIS — R300 Dysuria: Secondary | ICD-10-CM

## 2023-07-11 NOTE — Progress Notes (Unsigned)
Subjective:    Patient ID: Kimberly Roy, female    DOB: 1930-10-15, 89 y.o.   MRN: [id number]  Patient here for  Chief Complaint  Patient presents with   Urinary Tract Infection    HPI Recently has been having concerns regarding urinary symptoms. Recent urine checks - negative infection. Given persistent symptoms, she was asked to come in for evaluation.  On questioning today, she is having burning as outlined.  Also describes intravaginal burning. No bleeding.  No discharge.  No abdominal pain.  No fever. Sees urology.  Being treated for IC.    Past Medical History:  Diagnosis Date   Anemia    Anxiety    Chest pain    CHF (congestive heart failure) (HCC)    Constipation    DDD (degenerative disc disease), cervical    Depression    DVT (deep venous thrombosis) (HCC)    Dysphonia    Dyspnea    Fatty liver    Fatty liver    Headache    Hyperlipidemia    Hyperpiesia    Hypertension    Hypothyroidism    Interstitial cystitis    Left ventricular dysfunction    Lymphedema    Nephrolithiasis    Obstructive sleep apnea    Osteoarthritis    knees/cervical and lumbar spine   Pulmonary hypertension (HCC)    Pulmonary nodules    followed by Dr Meredeth Ide   Pure hypercholesterolemia    Renal cyst    right   Past Surgical History:  Procedure Laterality Date   ABDOMINAL HYSTERECTOMY     ovaries left in place   APPENDECTOMY     Back Surgeries     BACK SURGERY     BREAST REDUCTION SURGERY     3/99   CARDIAC CATHETERIZATION     cataracts Bilateral    CERVICAL SPINE SURGERY     ESOPHAGEAL MANOMETRY N/A 08/02/2015   Procedure: ESOPHAGEAL MANOMETRY (EM);  Surgeon: Elnita Maxwell, MD;  Location: [facility] ENDOSCOPY;  Service: Endoscopy;  Laterality: N/A;   ESOPHAGOGASTRODUODENOSCOPY N/A 02/27/2015   Procedure: ESOPHAGOGASTRODUODENOSCOPY (EGD);  Surgeon: Wallace Cullens, MD;  Location: [facility] ENDOSCOPY;  Service: Gastroenterology;  Laterality: N/A;   ESOPHAGOGASTRODUODENOSCOPY (EGD) WITH  PROPOFOL N/A 10/30/2018   Procedure: ESOPHAGOGASTRODUODENOSCOPY (EGD) WITH PROPOFOL;  Surgeon: Christena Deem, MD;  Location: [facility] ENDOSCOPY;  Service: Endoscopy;  Laterality: N/A;   EXCISIONAL HEMORRHOIDECTOMY     EYE SURGERY     FRACTURE SURGERY     HEMORRHOID SURGERY     HIP SURGERY  2013   Right hip surgery   JOINT REPLACEMENT     KNEE ARTHROSCOPY     left and right   ORIF FEMUR FRACTURE Right 08/07/2018   Procedure: OPEN REDUCTION INTERNAL FIXATION (ORIF) DISTAL FEMUR FRACTURE;  Surgeon: Kennedy Bucker, MD;  Location: [facility] ORS;  Service: Orthopedics;  Laterality: Right;   REDUCTION MAMMAPLASTY Bilateral YRS AGO   REPLACEMENT TOTAL KNEE Bilateral    rotator cuff surgery     blilateral   TONSILECTOMY/ADENOIDECTOMY WITH MYRINGOTOMY     VISCERAL ARTERY INTERVENTION N/A 02/25/2019   Procedure: VISCERAL ARTERY INTERVENTION;  Surgeon: Annice Needy, MD;  Location: [facility] INVASIVE CV LAB;  Service: Cardiovascular;  Laterality: N/A;   Family History  Problem Relation Age of Onset   Heart disease Mother    Stroke Mother    Hypertension Mother    Heart disease Father        myocardial infarction age 6  Breast cancer Neg Hx    Social History   Socioeconomic History   Marital status: Widowed    Spouse name: Not on file   Number of children: 2   Years of education: 39   Highest education level: Associate degree: occupational, Scientist, product/process development, or vocational program  Occupational History    Comment: Engineer, site  Tobacco Use   Smoking status: Never   Smokeless tobacco: Never  Vaping Use   Vaping status: Never Used  Substance and Sexual Activity   Alcohol use: No    Alcohol/week: 0.0 standard drinks of alcohol   Drug use: Never   Sexual activity: Not Currently  Other Topics Concern   Not on file  Social History Narrative   Not on file   Social Determinants of Health   Financial Resource Strain: Low Risk  (02/13/2023)   Overall Financial Resource Strain (CARDIA)     Difficulty of Paying Living Expenses: Not very hard  Food Insecurity: No Food Insecurity (02/26/2023)   Hunger Vital Sign    Worried About Running Out of Food in the Last Year: Never true    Ran Out of Food in the Last Year: Never true  Transportation Needs: No Transportation Needs (02/26/2023)   PRAPARE - Administrator, Civil Service (Medical): No    Lack of Transportation (Non-Medical): No  Physical Activity: Inactive (02/13/2023)   Exercise Vital Sign    Days of Exercise per Week: 0 days    Minutes of Exercise per Session: 10 min  Stress: No Stress Concern Present (02/13/2023)   [employer] of Occupational Health - Occupational Stress Questionnaire    Feeling of Stress : Only a little  Social Connections: Moderately Isolated (02/13/2023)   Social Connection and Isolation Panel [NHANES]    Frequency of Communication with Friends and Family: More than three times a week    Frequency of Social Gatherings with Friends and Family: Once a week    Attends Religious Services: 1 to 4 times per year    Active Member of [employer] or Organizations: No    Attends Banker Meetings: Never    Marital Status: Widowed     Review of Systems  Constitutional:  Negative for appetite change and unexpected weight change.  HENT:  Negative for congestion and sinus pressure.   Respiratory:  Negative for cough and chest tightness.        Breathing stable.   Cardiovascular:  Negative for chest pain and palpitations.  Gastrointestinal:  Negative for abdominal pain, nausea and vomiting.  Genitourinary:  Positive for dysuria.       Vaginal irritation.   Musculoskeletal:  Negative for joint swelling and myalgias.  Skin:  Negative for color change and rash.  Neurological:  Negative for dizziness and headaches.  Psychiatric/Behavioral:  Negative for agitation and dysphoric mood.        Objective:     BP 136/78   Pulse 81   Temp 98 F (36.7 C)   Resp 16   Ht 5\' 2"  (1.575 m)    Wt 171 lb (77.6 kg)   SpO2 98%   BMI 31.28 kg/m  Wt Readings from Last 3 Encounters:  07/11/23 171 lb (77.6 kg)  06/24/23 173 lb 4.8 oz (78.6 kg)  05/19/23 177 lb (80.3 kg)    Physical Exam Vitals reviewed.  Constitutional:      General: She is not in acute distress.    Appearance: Normal appearance.  HENT:     Head:  Normocephalic and atraumatic.     Right Ear: External ear normal.     Left Ear: External ear normal.  Eyes:     General: No scleral icterus.       Right eye: No discharge.        Left eye: No discharge.     Conjunctiva/sclera: Conjunctivae normal.  Neck:     Thyroid: No thyromegaly.  Cardiovascular:     Rate and Rhythm: Normal rate and regular rhythm.  Pulmonary:     Effort: No respiratory distress.     Breath sounds: Normal breath sounds. No wheezing.  Abdominal:     General: Bowel sounds are normal.     Palpations: Abdomen is soft.     Tenderness: There is no abdominal tenderness.  Genitourinary:    Comments: Normal external genitalia.  Vaginal vault without lesions. KOH/wet prep obtained. Could not appreciate any adnexal masses or tenderness.   Musculoskeletal:        General: No swelling or tenderness.     Cervical back: Neck supple. No tenderness.  Lymphadenopathy:     Cervical: No cervical adenopathy.  Skin:    Findings: No erythema or rash.  Neurological:     Mental Status: She is alert.  Psychiatric:        Mood and Affect: Mood normal.        Behavior: Behavior normal.      Outpatient Encounter Medications as of 07/11/2023  Medication Sig   albuterol (VENTOLIN HFA) 108 (90 Base) MCG/ACT inhaler INHALE 2 PUFFS INTO THE LUNGS EVERY 6 HOURS AS NEEDED FOR WHEEZING OR SHORTNESS OF BREATH   aluminum-magnesium hydroxide-simethicone (MAALOX) 200-200-20 MG/5ML SUSP Take 15 mLs by mouth 4 (four) times daily -  before meals and at bedtime.    amLODipine (NORVASC) 10 MG tablet TAKE 1 TABLET BY MOUTH DAILY   aspirin 81 MG EC tablet Take 81 mg by mouth  daily. Every other day   atorvastatin (LIPITOR) 20 MG tablet TAKE 1 TABLET BY MOUTH AT BEDTIME   benazepril (LOTENSIN) 40 MG tablet TAKE 1 TABLET BY MOUTH DAILY   betamethasone dipropionate 0.05 % cream Apply 1 application topically 2 (two) times daily.   budesonide-formoterol (SYMBICORT) 80-4.5 MCG/ACT inhaler Inhale 2 puffs into the lungs in the morning and at bedtime.   busPIRone (BUSPAR) 10 MG tablet Take 10 mg by mouth 3 (three) times daily.   calcium citrate-vitamin D (CITRACAL+D) 315-200 MG-UNIT tablet Take 2 tablets by mouth daily.   Cholecalciferol (VITAMIN D3) 1000 UNITS CAPS Take 1 capsule by mouth daily.   clobetasol cream (TEMOVATE) 0.05 % Apply 1 application topically See admin instructions. Apply to affected areas of body 1 - 2 times daily as needed for itchy bumps. Avoid applying to face, groin, and axilla. Use as directed.   cyanocobalamin (VITAMIN B12) 1000 MCG/ML injection Inject 1 mL (1,000 mcg total) into the muscle every 3 (three) months.   Diclofenac Sodium 3 % GEL Apply topically 2 (two) times daily.   Docusate Sodium (DSS) 100 MG CAPS Take 100 mg by mouth 2 (two) times daily.   DULoxetine (CYMBALTA) 60 MG capsule Take 60 mg by mouth daily.   Fluocinolone Acetonide 0.01 % OIL Apply 1-2 drops into ears once to twice daily as needed.   fluocinonide (LIDEX) 0.05 % external solution Apply topically 2 (two) times daily. Apply drops to scalp for itch   fluticasone (FLONASE) 50 MCG/ACT nasal spray Place 2 sprays into both nostrils daily.   furosemide (LASIX)  20 MG tablet TAKE 1 TABLET BY MOUTH DAILY AS NEEDED   gabapentin (NEURONTIN) 300 MG capsule Take two tablets tid (in am, midday and q hs).   HYDROcodone-acetaminophen (NORCO) 10-325 MG tablet Take 1 tablet by mouth 3 (three) times daily.   Infant Care Products [facility]) OINT Apply topically as directed (as discussed).   ipratropium (ATROVENT) 0.03 % nasal spray Place 2 sprays into both nostrils every 12 (twelve) hours.    ketoconazole (NIZORAL) 2 % shampoo Apply 1 Application topically See admin instructions. apply three times per week, massage into scalp and leave in for 10 minutes before rinsing out   levalbuterol (XOPENEX) 0.63 MG/3ML nebulizer solution Take 3 mLs (0.63 mg total) by nebulization every 6 (six) hours as needed for wheezing or shortness of breath.   LINZESS 145 MCG CAPS capsule Take 145 mcg by mouth daily.   magnesium oxide (MAG-OX) 400 MG tablet Take 1 tablet (400 mg total) by mouth daily.   metoprolol succinate (TOPROL-XL) 50 MG 24 hr tablet TAKE 1 TABLET BY MOUTH DAILY. TAKE WITH OR IMMEDIATELY FOLLOWING A MEAL.   morphine (MS CONTIN) 30 MG 12 hr tablet Take 30 mg by mouth every 12 (twelve) hours.   morphine (MS CONTIN) 60 MG 12 hr tablet Take 1 tablet (60 mg total) by mouth 2 (two) times daily.   mupirocin ointment (BACTROBAN) 2 % Apply 1 Application topically 2 (two) times daily.   nystatin (MYCOSTATIN/NYSTOP) powder Apply 1 application. topically 2 (two) times daily as needed.   nystatin cream (MYCOSTATIN) Apply 1 Application topically 2 (two) times daily.   polyethylene glycol powder (GLYCOLAX/MIRALAX) 17 GM/SCOOP powder MIX 17 GRAMS AS MARKED ON BOTTLE TOP IN 8 OUNCES OF WATER AND DRINK ONCE A DAY AS DIRECTED.   RABEprazole (ACIPHEX) 20 MG tablet TAKE 1 TABLET BY MOUTH TWICE A DAY BEFORE A MEAL   Simethicone (GAS-X PO) Take 2 tablets by mouth 2 (two) times daily.   sodium chloride (OCEAN) 0.65 % nasal spray Place 1 spray into the nose as needed.   SYNTHROID 100 MCG tablet TAKE 1 TABLET BY MOUTH DAILY   triamcinolone ointment (KENALOG) 0.1 % APPLY TWICE DAILY TO BITES AND RASH UNTIL FLAT AND SMOOTH **DO NOT APPLY TO FACE**   No facility-administered encounter medications on file as of 07/11/2023.     Lab Results  Component Value Date   WBC 5.6 02/12/2023   HGB 10.5 (L) 02/12/2023   HCT 32.0 (L) 02/12/2023   PLT 133 (L) 02/12/2023   GLUCOSE 85 06/24/2023   CHOL 125 05/19/2023   TRIG  67.0 05/19/2023   HDL 54.00 05/19/2023   LDLCALC 58 05/19/2023   ALT 9 05/19/2023   AST 18 05/19/2023   NA 139 06/24/2023   K 5.1 06/27/2023   CL 102 06/24/2023   CREATININE 0.84 06/24/2023   BUN 19 06/24/2023   CO2 32 06/24/2023   TSH 1.45 05/19/2023   INR 0.89 08/06/2018   HGBA1C 6.0 10/19/2014    No results found.     Assessment & Plan:  Vaginal irritation Assessment & Plan: Persistent burning (urinary) and vaginal as outlined. Recent urine culture negative for infection. Pelvic exam as outlined. KOH/wet prep obtained.  Wait for results. Discussed the need for f/u with urology if persistent symptoms.  Being treated for IC.   Orders: -     Cervicovaginal ancillary only  Primary hypertension Assessment & Plan: Blood pressure as outlined.  Continue benazepril, amlodipine, lasix and metoprolol. Follow pressures.  Follow  metabolic panel.    Gastroesophageal reflux disease, unspecified whether esophagitis present Assessment & Plan: Continue aciphex.    Dysuria Assessment & Plan: Recent urine culture negative for infection.  Vaginal exam as outlined.  Await KOH/wet prep results. Discussed the need for urology evaluation.       Dale Ethete, MD

## 2023-07-13 ENCOUNTER — Encounter: Payer: Self-pay | Admitting: Internal Medicine

## 2023-07-13 DIAGNOSIS — N898 Other specified noninflammatory disorders of vagina: Secondary | ICD-10-CM | POA: Insufficient documentation

## 2023-07-13 NOTE — Assessment & Plan Note (Signed)
Recent urine culture negative for infection.  Vaginal exam as outlined.  Await KOH/wet prep results. Discussed the need for urology evaluation.

## 2023-07-13 NOTE — Assessment & Plan Note (Signed)
Blood pressure as outlined.  Continue benazepril, amlodipine, lasix and metoprolol.  Follow pressures.  Follow metabolic panel.

## 2023-07-13 NOTE — Assessment & Plan Note (Signed)
Continue aciphex.  

## 2023-07-13 NOTE — Assessment & Plan Note (Signed)
Persistent burning (urinary) and vaginal as outlined. Recent urine culture negative for infection. Pelvic exam as outlined. KOH/wet prep obtained.  Wait for results. Discussed the need for f/u with urology if persistent symptoms.  Being treated for IC.

## 2023-07-15 ENCOUNTER — Ambulatory Visit: Payer: Medicare HMO

## 2023-07-15 DIAGNOSIS — I340 Nonrheumatic mitral (valve) insufficiency: Secondary | ICD-10-CM | POA: Diagnosis not present

## 2023-07-15 DIAGNOSIS — I5032 Chronic diastolic (congestive) heart failure: Secondary | ICD-10-CM | POA: Diagnosis not present

## 2023-07-15 DIAGNOSIS — I491 Atrial premature depolarization: Secondary | ICD-10-CM | POA: Diagnosis not present

## 2023-07-15 DIAGNOSIS — I10 Essential (primary) hypertension: Secondary | ICD-10-CM | POA: Diagnosis not present

## 2023-07-15 DIAGNOSIS — R001 Bradycardia, unspecified: Secondary | ICD-10-CM | POA: Diagnosis not present

## 2023-07-15 DIAGNOSIS — I360 Nonrheumatic tricuspid (valve) stenosis: Secondary | ICD-10-CM | POA: Diagnosis not present

## 2023-07-15 DIAGNOSIS — I350 Nonrheumatic aortic (valve) stenosis: Secondary | ICD-10-CM | POA: Diagnosis not present

## 2023-07-15 LAB — CERVICOVAGINAL ANCILLARY ONLY
Bacterial Vaginitis (gardnerella): NEGATIVE
Candida Glabrata: NEGATIVE
Candida Vaginitis: NEGATIVE
Comment: NEGATIVE
Comment: NEGATIVE
Comment: NEGATIVE

## 2023-07-21 ENCOUNTER — Other Ambulatory Visit: Payer: Self-pay | Admitting: *Deleted

## 2023-07-21 ENCOUNTER — Other Ambulatory Visit: Payer: Self-pay | Admitting: Internal Medicine

## 2023-07-21 ENCOUNTER — Telehealth: Payer: Self-pay

## 2023-07-21 NOTE — Telephone Encounter (Signed)
Patient states she received a reminder about an upcoming appointment she has with Dr. Dale Hearne tomorrow.  Patient states she just saw Dr. Lorin Picket on 07/11/2023 and wasn't sure if she needs to keep this appointment.  Patient states she would like for [place] to please let her know.

## 2023-07-21 NOTE — Telephone Encounter (Signed)
If she does not feel needs to come in - ok to reschedule.  See me - there is another pt can place in visit.

## 2023-07-22 ENCOUNTER — Ambulatory Visit
Admission: RE | Admit: 2023-07-22 | Discharge: 2023-07-22 | Disposition: A | Discharge status: Home / Self Care | Payer: Medicare HMO | Source: Home / Self Care | Attending: Internal Medicine | Admitting: Internal Medicine

## 2023-07-22 ENCOUNTER — Ambulatory Visit: Payer: Medicare HMO | Admitting: Internal Medicine

## 2023-07-22 ENCOUNTER — Ambulatory Visit
Admission: RE | Admit: 2023-07-22 | Discharge: 2023-07-22 | Disposition: A | Discharge status: Home / Self Care | Payer: Medicare HMO | Source: Ambulatory Visit | Attending: Internal Medicine | Admitting: Internal Medicine

## 2023-07-22 VITALS — BP 134/70 | HR 75 | Temp 98.0°F | Resp 16 | Ht 62.0 in | Wt 173.0 lb

## 2023-07-22 DIAGNOSIS — R1084 Generalized abdominal pain: Secondary | ICD-10-CM | POA: Insufficient documentation

## 2023-07-22 DIAGNOSIS — R109 Unspecified abdominal pain: Secondary | ICD-10-CM | POA: Diagnosis not present

## 2023-07-22 DIAGNOSIS — F439 Reaction to severe stress, unspecified: Secondary | ICD-10-CM

## 2023-07-22 DIAGNOSIS — I10 Essential (primary) hypertension: Secondary | ICD-10-CM

## 2023-07-22 DIAGNOSIS — M7989 Other specified soft tissue disorders: Secondary | ICD-10-CM

## 2023-07-22 DIAGNOSIS — E785 Hyperlipidemia, unspecified: Secondary | ICD-10-CM

## 2023-07-22 DIAGNOSIS — I5032 Chronic diastolic (congestive) heart failure: Secondary | ICD-10-CM

## 2023-07-22 DIAGNOSIS — I272 Pulmonary hypertension, unspecified: Secondary | ICD-10-CM

## 2023-07-22 DIAGNOSIS — K219 Gastro-esophageal reflux disease without esophagitis: Secondary | ICD-10-CM | POA: Diagnosis not present

## 2023-07-22 DIAGNOSIS — K5909 Other constipation: Secondary | ICD-10-CM

## 2023-07-22 DIAGNOSIS — E039 Hypothyroidism, unspecified: Secondary | ICD-10-CM

## 2023-07-22 DIAGNOSIS — I700 Atherosclerosis of aorta: Secondary | ICD-10-CM

## 2023-07-22 DIAGNOSIS — J4520 Mild intermittent asthma, uncomplicated: Secondary | ICD-10-CM

## 2023-07-22 LAB — BASIC METABOLIC PANEL
BUN: 21 mg/dL (ref 6–23)
CO2: 30 meq/L (ref 19–32)
Calcium: 9.6 mg/dL (ref 8.4–10.5)
Chloride: 103 meq/L (ref 96–112)
Creatinine, Ser: 0.84 mg/dL (ref 0.40–1.20)
GFR: 61.2 mL/min (ref >60.00)
Glucose, Bld: 101 mg/dL — ABNORMAL HIGH (ref 70–99)
Potassium: 4.5 meq/L (ref 3.5–5.1)
Sodium: 141 meq/L (ref 135–145)

## 2023-07-22 LAB — CBC WITH DIFFERENTIAL/PLATELET
Basophils Absolute: 0 10*3/uL (ref 0.0–0.1)
Basophils Relative: 0.6 % (ref 0.0–3.0)
Eosinophils Absolute: 0.2 10*3/uL (ref 0.0–0.7)
Eosinophils Relative: 2.8 % (ref 0.0–5.0)
HCT: 33.3 % — ABNORMAL LOW (ref 36.0–46.0)
Hemoglobin: 11 g/dL — ABNORMAL LOW (ref 12.0–15.0)
Lymphocytes Relative: 24.3 % (ref 12.0–46.0)
Lymphs Abs: 1.4 10*3/uL (ref 0.7–4.0)
MCHC: 33.1 g/dL (ref 30.0–36.0)
MCV: 101.7 fL — ABNORMAL HIGH (ref 78.0–100.0)
Monocytes Absolute: 0.5 10*3/uL (ref 0.1–1.0)
Monocytes Relative: 8.2 % (ref 3.0–12.0)
Neutro Abs: 3.7 10*3/uL (ref 1.4–7.7)
Neutrophils Relative %: 64.1 % (ref 43.0–77.0)
Platelets: 147 10*3/uL — ABNORMAL LOW (ref 150.0–400.0)
RBC: 3.27 Mil/uL — ABNORMAL LOW (ref 3.87–5.11)
RDW: 14.1 % (ref 11.5–15.5)
WBC: 5.7 10*3/uL (ref 4.0–10.5)

## 2023-07-22 LAB — HEPATIC FUNCTION PANEL
ALT: 8 U/L (ref 0–35)
AST: 14 U/L (ref 0–37)
Albumin: 4.1 g/dL (ref 3.5–5.2)
Alkaline Phosphatase: 86 U/L (ref 39–117)
Bilirubin, Direct: 0.1 mg/dL (ref 0.0–0.3)
Total Bilirubin: 0.5 mg/dL (ref 0.2–1.2)
Total Protein: 6.1 g/dL (ref 6.0–8.3)

## 2023-07-22 LAB — TSH: TSH: 2.51 u[IU]/mL (ref 0.35–5.50)

## 2023-07-22 NOTE — Progress Notes (Signed)
Subjective:    Patient ID: Kimberly Roy, female    DOB: 02-Apr-1931, 89 y.o.   MRN: [id number]  Patient here for  Chief Complaint  Patient presents with   Medical Management of Chronic Issues    HPI Here for a scheduled follow up.  Recently evaluated for vaginal irritation. No increased vaginal problems reported today. Saw cardiology 07/15/23 - decreased metoprolol to 25mg  q day. Recommended heart monitor. 2D echocardiogram 07/14/2022 showed normal ventricular function with LVEF 60 to 65% with mild mitral regurgitation, mild to moderate tricuspid stenosis, and mild aortic stenosis. Overall heart appears to be stable.  Breathing stable. She does report that starting a couple of weeks ago, she noticed being more irritable. Also reported some fatigue.  Was questioning if thyroid could be off.  Also reports some increased abdominal discomfort.  Appears to be worse after eating. Increased gas.  Soft stool. Taking miralax, linzess and stool softener.  Discussed the need to keep bowels moving. No vomiting reported.   Past Medical History:  Diagnosis Date   Anemia    Anxiety    Chest pain    CHF (congestive heart failure) (HCC)    Constipation    DDD (degenerative disc disease), cervical    Depression    DVT (deep venous thrombosis) (HCC)    Dysphonia    Dyspnea    Fatty liver    Fatty liver    Headache    Hyperlipidemia    Hyperpiesia    Hypertension    Hypothyroidism    Interstitial cystitis    Left ventricular dysfunction    Lymphedema    Nephrolithiasis    Obstructive sleep apnea    Osteoarthritis    knees/cervical and lumbar spine   Pulmonary hypertension (HCC)    Pulmonary nodules    followed by Dr Meredeth Ide   Pure hypercholesterolemia    Renal cyst    right   Past Surgical History:  Procedure Laterality Date   ABDOMINAL HYSTERECTOMY     ovaries left in place   APPENDECTOMY     Back Surgeries     BACK SURGERY     BREAST REDUCTION SURGERY     3/99   CARDIAC  CATHETERIZATION     cataracts Bilateral    CERVICAL SPINE SURGERY     ESOPHAGEAL MANOMETRY N/A 08/02/2015   Procedure: ESOPHAGEAL MANOMETRY (EM);  Surgeon: Elnita Maxwell, MD;  Location: [facility] ENDOSCOPY;  Service: Endoscopy;  Laterality: N/A;   ESOPHAGOGASTRODUODENOSCOPY N/A 02/27/2015   Procedure: ESOPHAGOGASTRODUODENOSCOPY (EGD);  Surgeon: Wallace Cullens, MD;  Location: [facility] ENDOSCOPY;  Service: Gastroenterology;  Laterality: N/A;   ESOPHAGOGASTRODUODENOSCOPY (EGD) WITH PROPOFOL N/A 10/30/2018   Procedure: ESOPHAGOGASTRODUODENOSCOPY (EGD) WITH PROPOFOL;  Surgeon: Christena Deem, MD;  Location: [facility] ENDOSCOPY;  Service: Endoscopy;  Laterality: N/A;   EXCISIONAL HEMORRHOIDECTOMY     EYE SURGERY     FRACTURE SURGERY     HEMORRHOID SURGERY     HIP SURGERY  2013   Right hip surgery   JOINT REPLACEMENT     KNEE ARTHROSCOPY     left and right   ORIF FEMUR FRACTURE Right 08/07/2018   Procedure: OPEN REDUCTION INTERNAL FIXATION (ORIF) DISTAL FEMUR FRACTURE;  Surgeon: Kennedy Bucker, MD;  Location: [facility] ORS;  Service: Orthopedics;  Laterality: Right;   REDUCTION MAMMAPLASTY Bilateral YRS AGO   REPLACEMENT TOTAL KNEE Bilateral    rotator cuff surgery     blilateral   TONSILECTOMY/ADENOIDECTOMY WITH MYRINGOTOMY     VISCERAL ARTERY  INTERVENTION N/A 02/25/2019   Procedure: VISCERAL ARTERY INTERVENTION;  Surgeon: Annice Needy, MD;  Location: [facility] INVASIVE CV LAB;  Service: Cardiovascular;  Laterality: N/A;   Family History  Problem Relation Age of Onset   Heart disease Mother    Stroke Mother    Hypertension Mother    Heart disease Father        myocardial infarction age 47   Breast cancer Neg Hx    Social History   Socioeconomic History   Marital status: Widowed    Spouse name: Not on file   Number of children: 2   Years of education: 72   Highest education level: Associate degree: occupational, Scientist, product/process development, or vocational program  Occupational History    Comment: Engineer, site   Tobacco Use   Smoking status: Never   Smokeless tobacco: Never  Vaping Use   Vaping status: Never Used  Substance and Sexual Activity   Alcohol use: No    Alcohol/week: 0.0 standard drinks of alcohol   Drug use: Never   Sexual activity: Not Currently  Other Topics Concern   Not on file  Social History Narrative   Not on file   Social Determinants of Health   Financial Resource Strain: Low Risk  (07/20/2023)   Overall Financial Resource Strain (CARDIA)    Difficulty of Paying Living Expenses: Not hard at all  Food Insecurity: No Food Insecurity (07/20/2023)   Hunger Vital Sign    Worried About Running Out of Food in the Last Year: Never true    Ran Out of Food in the Last Year: Never true  Transportation Needs: No Transportation Needs (07/20/2023)   PRAPARE - Administrator, Civil Service (Medical): No    Lack of Transportation (Non-Medical): No  Physical Activity: Inactive (07/20/2023)   Exercise Vital Sign    Days of Exercise per Week: 0 days    Minutes of Exercise per Session: 10 min  Stress: No Stress Concern Present (07/20/2023)   [employer] of Occupational Health - Occupational Stress Questionnaire    Feeling of Stress : Only a little  Social Connections: Socially Isolated (07/20/2023)   Social Connection and Isolation Panel [NHANES]    Frequency of Communication with Friends and Family: More than three times a week    Frequency of Social Gatherings with Friends and Family: Twice a week    Attends Religious Services: Never    Database administrator or Organizations: No    Attends Banker Meetings: Never    Marital Status: Widowed     Review of Systems  Constitutional:  Positive for fatigue. Negative for fever.  HENT:  Negative for congestion and sinus pressure.   Respiratory:  Negative for cough and chest tightness.        Breathing stable.   Cardiovascular:  Negative for chest pain and palpitations.  Gastrointestinal:   Positive for abdominal pain. Negative for nausea and vomiting.       Bowels as outlined.   Genitourinary:  Negative for difficulty urinating and dysuria.  Musculoskeletal:  Negative for joint swelling and myalgias.  Skin:  Negative for color change and rash.  Neurological:  Negative for dizziness and headaches.  Psychiatric/Behavioral:  Negative for agitation and dysphoric mood.        Has noticed some increased irritability recently.        Objective:     BP 134/70   Pulse 75   Temp 98 F (36.7 C)   Resp 16  Ht 5\' 2"  (1.575 m)   Wt 173 lb (78.5 kg)   SpO2 97%   BMI 31.64 kg/m  Wt Readings from Last 3 Encounters:  07/22/23 173 lb (78.5 kg)  07/11/23 171 lb (77.6 kg)  06/24/23 173 lb 4.8 oz (78.6 kg)    Physical Exam Vitals reviewed.  Constitutional:      General: She is not in acute distress.    Appearance: Normal appearance.  HENT:     Head: Normocephalic and atraumatic.     Right Ear: External ear normal.     Left Ear: External ear normal.     Mouth/Throat:     Pharynx: No oropharyngeal exudate or posterior oropharyngeal erythema.  Eyes:     General: No scleral icterus.       Right eye: No discharge.        Left eye: No discharge.     Conjunctiva/sclera: Conjunctivae normal.  Neck:     Thyroid: No thyromegaly.  Cardiovascular:     Rate and Rhythm: Normal rate and regular rhythm.  Pulmonary:     Effort: No respiratory distress.     Breath sounds: Normal breath sounds. No wheezing.  Abdominal:     General: Bowel sounds are normal.     Palpations: Abdomen is soft.     Comments: No significant pain to palpation.   Musculoskeletal:        General: No swelling or tenderness.     Cervical back: Neck supple. No tenderness.  Lymphadenopathy:     Cervical: No cervical adenopathy.  Skin:    Findings: No erythema or rash.  Neurological:     Mental Status: She is alert.  Psychiatric:        Mood and Affect: Mood normal.        Behavior: Behavior normal.       Outpatient Encounter Medications as of 07/22/2023  Medication Sig   albuterol (VENTOLIN HFA) 108 (90 Base) MCG/ACT inhaler INHALE 2 PUFFS INTO THE LUNGS EVERY 6 HOURS AS NEEDED FOR WHEEZING OR SHORTNESS OF BREATH   aluminum-magnesium hydroxide-simethicone (MAALOX) 200-200-20 MG/5ML SUSP Take 15 mLs by mouth 4 (four) times daily -  before meals and at bedtime.    amLODipine (NORVASC) 10 MG tablet TAKE 1 TABLET BY MOUTH DAILY   aspirin 81 MG EC tablet Take 81 mg by mouth daily. Every other day   atorvastatin (LIPITOR) 20 MG tablet TAKE 1 TABLET BY MOUTH AT BEDTIME   benazepril (LOTENSIN) 40 MG tablet TAKE 1 TABLET BY MOUTH DAILY   betamethasone dipropionate 0.05 % cream Apply 1 application topically 2 (two) times daily.   budesonide-formoterol (SYMBICORT) 80-4.5 MCG/ACT inhaler Inhale 2 puffs into the lungs in the morning and at bedtime.   busPIRone (BUSPAR) 10 MG tablet Take 10 mg by mouth 3 (three) times daily.   calcium citrate-vitamin D (CITRACAL+D) 315-200 MG-UNIT tablet Take 2 tablets by mouth daily.   Cholecalciferol (VITAMIN D3) 1000 UNITS CAPS Take 1 capsule by mouth daily.   clobetasol cream (TEMOVATE) 0.05 % Apply 1 application topically See admin instructions. Apply to affected areas of body 1 - 2 times daily as needed for itchy bumps. Avoid applying to face, groin, and axilla. Use as directed.   cyanocobalamin (VITAMIN B12) 1000 MCG/ML injection INJECT 1 ML INTO THE MUSCLE EVERY 3 MONTHS.   Diclofenac Sodium 3 % GEL Apply topically 2 (two) times daily.   Docusate Sodium (DSS) 100 MG CAPS Take 100 mg by mouth 2 (two) times daily.  DULoxetine (CYMBALTA) 60 MG capsule Take 60 mg by mouth daily.   Fluocinolone Acetonide 0.01 % OIL Apply 1-2 drops into ears once to twice daily as needed.   fluocinonide (LIDEX) 0.05 % external solution Apply topically 2 (two) times daily. Apply drops to scalp for itch   fluticasone (FLONASE) 50 MCG/ACT nasal spray Place 2 sprays into both nostrils  daily.   furosemide (LASIX) 20 MG tablet TAKE 1 TABLET BY MOUTH DAILY AS NEEDED   gabapentin (NEURONTIN) 300 MG capsule Take two tablets tid (in am, midday and q hs).   HYDROcodone-acetaminophen (NORCO) 10-325 MG tablet Take 1 tablet by mouth 3 (three) times daily.   Infant Care Products [facility]) OINT Apply topically as directed (as discussed).   ipratropium (ATROVENT) 0.03 % nasal spray Place 2 sprays into both nostrils every 12 (twelve) hours.   ketoconazole (NIZORAL) 2 % shampoo Apply 1 Application topically See admin instructions. apply three times per week, massage into scalp and leave in for 10 minutes before rinsing out   levalbuterol (XOPENEX) 0.63 MG/3ML nebulizer solution Take 3 mLs (0.63 mg total) by nebulization every 6 (six) hours as needed for wheezing or shortness of breath.   LINZESS 145 MCG CAPS capsule Take 145 mcg by mouth daily.   magnesium oxide (MAG-OX) 400 MG tablet Take 1 tablet (400 mg total) by mouth daily.   metoprolol succinate (TOPROL-XL) 50 MG 24 hr tablet TAKE 1 TABLET BY MOUTH DAILY. TAKE WITH OR IMMEDIATELY FOLLOWING A MEAL.   morphine (MS CONTIN) 30 MG 12 hr tablet Take 30 mg by mouth every 12 (twelve) hours.   morphine (MS CONTIN) 60 MG 12 hr tablet Take 1 tablet (60 mg total) by mouth 2 (two) times daily.   mupirocin ointment (BACTROBAN) 2 % Apply 1 Application topically 2 (two) times daily.   nystatin (MYCOSTATIN/NYSTOP) powder Apply 1 application. topically 2 (two) times daily as needed.   nystatin cream (MYCOSTATIN) Apply 1 Application topically 2 (two) times daily.   polyethylene glycol powder (GLYCOLAX/MIRALAX) 17 GM/SCOOP powder MIX 17 GRAMS AS MARKED ON BOTTLE TOP IN 8 OUNCES OF WATER AND DRINK ONCE A DAY AS DIRECTED.   Simethicone (GAS-X PO) Take 2 tablets by mouth 2 (two) times daily.   sodium chloride (OCEAN) 0.65 % nasal spray Place 1 spray into the nose as needed.   SYNTHROID 100 MCG tablet TAKE 1 TABLET BY MOUTH DAILY   triamcinolone ointment  (KENALOG) 0.1 % APPLY TWICE DAILY TO BITES AND RASH UNTIL FLAT AND SMOOTH **DO NOT APPLY TO FACE**   [DISCONTINUED] RABEprazole (ACIPHEX) 20 MG tablet TAKE 1 TABLET BY MOUTH TWICE A DAY BEFORE A MEAL   No facility-administered encounter medications on file as of 07/22/2023.     Lab Results  Component Value Date   WBC 5.7 07/22/2023   HGB 11.0 (L) 07/22/2023   HCT 33.3 (L) 07/22/2023   PLT 147.0 (L) 07/22/2023   GLUCOSE 101 (H) 07/22/2023   CHOL 125 05/19/2023   TRIG 67.0 05/19/2023   HDL 54.00 05/19/2023   LDLCALC 58 05/19/2023   ALT 8 07/22/2023   AST 14 07/22/2023   NA 141 07/22/2023   K 4.5 07/22/2023   CL 103 07/22/2023   CREATININE 0.84 07/22/2023   BUN 21 07/22/2023   CO2 30 07/22/2023   TSH 2.51 07/22/2023   INR 0.89 08/06/2018   HGBA1C 6.0 10/19/2014       Assessment & Plan:  Generalized abdominal pain Assessment & Plan: Persistent abdominal discomfort as  outlined.  Has seen GI.  Instructed to take miralax bid - to keep bowels moving. Also on linzess and stool softener.  Check KUB. Previous CT unrevealing.  With increased acid reflux, increase protonix to bid.  Follow.   Orders: -     CBC with Differential/Platelet -     Basic metabolic panel -     Hepatic function panel -     DG Abd 1 View; Future  Hypothyroidism, unspecified type Assessment & Plan: On thyroid replacement.  Follow tsh.   Orders: -     TSH  Stress Assessment & Plan: Followed by psychiatry.  Has noticed more irritability recently.  Check labs, including tsh. Follow. Keep f/u with psychiatry.    Pulmonary hypertension (HCC) Assessment & Plan: Followed by pulmonary.  Continue nighttime oxygen.    Leg swelling Assessment & Plan: Improved with compression hose.     Primary hypertension Assessment & Plan: Blood pressure as outlined.  Continue benazepril, amlodipine, lasix and metoprolol. Follow pressures.  Follow metabolic panel.    Hyperlipidemia, unspecified hyperlipidemia  type Assessment & Plan: On lipitor.  Low cholesterol diet and exercise.  Follow lipid panel and liver function tests.     Gastroesophageal reflux disease, unspecified whether esophagitis present Assessment & Plan: Previously on aciphex. Now on protonix.  With increased acid reflux.  Increase protonix to bid.  Follow.  Call with update.    Chronic diastolic CHF (congestive heart failure) (HCC) Assessment & Plan: Euvolemic clinically.  Continue benazepril, metoprolol and lasix.    Chronic constipation Assessment & Plan: Has seen GI.  Continues on miralax, linzess and stool softener. Will check KUB.  Keep bowels moving regularly.  Follow symptoms.  If persistent pain, may need scan.  Follow.    Mild intermittent asthma without complication Assessment & Plan: F/u pulmonary 05/2023 - continue symbicort.  Breathing stable.    Aortic atherosclerosis (HCC) Assessment & Plan: Continue lipitor.       Dale [place], MD

## 2023-07-23 ENCOUNTER — Telehealth: Payer: Self-pay | Admitting: Internal Medicine

## 2023-07-23 NOTE — Telephone Encounter (Signed)
Patient stated she is taking Protonix 40 mg.

## 2023-07-23 NOTE — Telephone Encounter (Signed)
FYI- pt taking Linzess, stool softener and protonix. Called read room to have x-ray read STAT

## 2023-07-23 NOTE — Telephone Encounter (Signed)
Patient called and wanted to let her provider know what she is currently taking for her stomach. She said she is taking Linzess(Linaclotide), a stool softener, and pertonic. She said if you have any questions you can call her. Her number is [phone].

## 2023-07-23 NOTE — Telephone Encounter (Signed)
Increase protonix to bid take 30 minutes before breakfast and 30 minutes before evening meal. Also, xray reveals a moderate amount of stool in the colon.  Please confirm with her how her bowels are moving now.

## 2023-07-24 NOTE — Telephone Encounter (Signed)
Noted.  Continue with bowel regimen to keep bowels moving.  If she is doing better, will hold on further testing at this time. If symptoms worsen or change to do not continue to improve/resolve - let us know.

## 2023-07-24 NOTE — Telephone Encounter (Signed)
Pt notified. Pt verbalized understanding and stated she would let us know if anything changes.

## 2023-07-25 ENCOUNTER — Other Ambulatory Visit: Payer: Self-pay | Admitting: Internal Medicine

## 2023-07-25 ENCOUNTER — Telehealth: Payer: Self-pay | Admitting: Internal Medicine

## 2023-07-25 NOTE — Telephone Encounter (Signed)
Patient was seen last week and Dr Lorin Picket increased her 40mg  P[place] to 1 in morning and 1 at night. She is going to need a refill /90 day supply.

## 2023-07-27 ENCOUNTER — Encounter: Payer: Self-pay | Admitting: Internal Medicine

## 2023-07-27 ENCOUNTER — Telehealth: Payer: Self-pay | Admitting: Internal Medicine

## 2023-07-27 NOTE — Assessment & Plan Note (Signed)
Persistent abdominal discomfort as outlined.  Has seen GI.  Instructed to take miralax bid - to keep bowels moving. Also on linzess and stool softener.  Check KUB. Previous CT unrevealing.  With increased acid reflux, increase protonix to bid.  Follow.

## 2023-07-27 NOTE — Telephone Encounter (Signed)
Recently evaluation for abdominal pain.  Instructed to keep bowels moving - has a bowel regimen. Was placed on protonix bid. Need to know how symptoms are doing now.  Also confirm has f/u with GI scheduled.  If persistent increased symptoms, need to clarify persistent symptoms having.

## 2023-07-27 NOTE — Assessment & Plan Note (Signed)
On lipitor.  Low cholesterol diet and exercise.  Follow lipid panel and liver function tests.   

## 2023-07-27 NOTE — Assessment & Plan Note (Signed)
Blood pressure as outlined.  Continue benazepril, amlodipine, lasix and metoprolol.  Follow pressures.  Follow metabolic panel.

## 2023-07-27 NOTE — Assessment & Plan Note (Signed)
F/u pulmonary 05/2023 - continue symbicort.  Breathing stable.

## 2023-07-27 NOTE — Assessment & Plan Note (Signed)
Followed by pulmonary.  Continue nighttime oxygen.

## 2023-07-27 NOTE — Assessment & Plan Note (Signed)
Has seen GI.  Continues on miralax, linzess and stool softener. Will check KUB.  Keep bowels moving regularly.  Follow symptoms.  If persistent pain, may need scan.  Follow.

## 2023-07-27 NOTE — Assessment & Plan Note (Signed)
Euvolemic clinically.  Continue benazepril, metoprolol and lasix.

## 2023-07-27 NOTE — Assessment & Plan Note (Signed)
On thyroid replacement.  Follow tsh.  

## 2023-07-27 NOTE — Assessment & Plan Note (Addendum)
Previously on aciphex. Now on protonix.  With increased acid reflux.  Increase protonix to bid.  Follow.  Call with update.

## 2023-07-27 NOTE — Assessment & Plan Note (Signed)
Improved with compression hose.

## 2023-07-27 NOTE — Assessment & Plan Note (Signed)
Continue lipitor  ?

## 2023-07-27 NOTE — Assessment & Plan Note (Signed)
Followed by psychiatry.  Has noticed more irritability recently.  Check labs, including tsh. Follow. Keep f/u with psychiatry.

## 2023-07-28 ENCOUNTER — Encounter: Payer: Medicare HMO | Admitting: Dermatology

## 2023-07-28 NOTE — Telephone Encounter (Signed)
Ok. Need to know how she is doing.

## 2023-07-28 NOTE — Addendum Note (Signed)
Addended by: Rita Ohara D on: 07/28/2023 10:41 AM   Modules accepted: Orders

## 2023-07-28 NOTE — Telephone Encounter (Signed)
Ok to send in refill of protonix bid for her?

## 2023-07-29 ENCOUNTER — Emergency Department: Payer: Medicare HMO

## 2023-07-29 ENCOUNTER — Other Ambulatory Visit: Payer: Self-pay

## 2023-07-29 ENCOUNTER — Emergency Department
Admission: EM | Admit: 2023-07-29 | Discharge: 2023-07-29 | Disposition: A | Discharge status: Home / Self Care | Payer: Medicare HMO | Attending: Emergency Medicine | Admitting: Emergency Medicine

## 2023-07-29 DIAGNOSIS — I5030 Unspecified diastolic (congestive) heart failure: Secondary | ICD-10-CM | POA: Diagnosis not present

## 2023-07-29 DIAGNOSIS — F0390 Unspecified dementia without behavioral disturbance: Secondary | ICD-10-CM | POA: Diagnosis not present

## 2023-07-29 DIAGNOSIS — I110 Hypertensive heart disease with heart failure: Secondary | ICD-10-CM | POA: Diagnosis not present

## 2023-07-29 DIAGNOSIS — I10 Essential (primary) hypertension: Secondary | ICD-10-CM | POA: Diagnosis not present

## 2023-07-29 DIAGNOSIS — I700 Atherosclerosis of aorta: Secondary | ICD-10-CM | POA: Diagnosis not present

## 2023-07-29 DIAGNOSIS — R4182 Altered mental status, unspecified: Secondary | ICD-10-CM | POA: Diagnosis not present

## 2023-07-29 DIAGNOSIS — R911 Solitary pulmonary nodule: Secondary | ICD-10-CM | POA: Diagnosis not present

## 2023-07-29 DIAGNOSIS — I6523 Occlusion and stenosis of bilateral carotid arteries: Secondary | ICD-10-CM | POA: Diagnosis not present

## 2023-07-29 DIAGNOSIS — J45909 Unspecified asthma, uncomplicated: Secondary | ICD-10-CM | POA: Diagnosis not present

## 2023-07-29 DIAGNOSIS — R410 Disorientation, unspecified: Secondary | ICD-10-CM | POA: Diagnosis not present

## 2023-07-29 DIAGNOSIS — E039 Hypothyroidism, unspecified: Secondary | ICD-10-CM | POA: Diagnosis not present

## 2023-07-29 DIAGNOSIS — R519 Headache, unspecified: Secondary | ICD-10-CM | POA: Diagnosis not present

## 2023-07-29 DIAGNOSIS — R5381 Other malaise: Secondary | ICD-10-CM | POA: Diagnosis not present

## 2023-07-29 LAB — COMPREHENSIVE METABOLIC PANEL
ALT: 13 U/L (ref 0–44)
AST: 22 U/L (ref 15–41)
Albumin: 4.6 g/dL (ref 3.5–5.0)
Alkaline Phosphatase: 93 U/L (ref 38–126)
Anion gap: 9 (ref 5–15)
BUN: 16 mg/dL (ref 8–23)
CO2: 29 mmol/L (ref 22–32)
Calcium: 9.6 mg/dL (ref 8.9–10.3)
Chloride: 100 mmol/L (ref 98–111)
Creatinine, Ser: 0.79 mg/dL (ref 0.44–1.00)
GFR, Estimated: >60 mL/min (ref >60)
Glucose, Bld: 106 mg/dL — ABNORMAL HIGH (ref 70–99)
Potassium: 3.9 mmol/L (ref 3.5–5.1)
Sodium: 138 mmol/L (ref 135–145)
Total Bilirubin: 0.9 mg/dL (ref <1.2)
Total Protein: 7.6 g/dL (ref 6.5–8.1)

## 2023-07-29 LAB — CBC WITH DIFFERENTIAL/PLATELET
Abs Immature Granulocytes: 0.02 10*3/uL (ref 0.00–0.07)
Basophils Absolute: 0 10*3/uL (ref 0.0–0.1)
Basophils Relative: 0 %
Eosinophils Absolute: 0.2 10*3/uL (ref 0.0–0.5)
Eosinophils Relative: 3 %
HCT: 37.2 % (ref 36.0–46.0)
Hemoglobin: 12.2 g/dL (ref 12.0–15.0)
Immature Granulocytes: 0 %
Lymphocytes Relative: 28 %
Lymphs Abs: 1.8 10*3/uL (ref 0.7–4.0)
MCH: 33.7 pg (ref 26.0–34.0)
MCHC: 32.8 g/dL (ref 30.0–36.0)
MCV: 102.8 fL — ABNORMAL HIGH (ref 80.0–100.0)
Monocytes Absolute: 0.5 10*3/uL (ref 0.1–1.0)
Monocytes Relative: 7 %
Neutro Abs: 4.1 10*3/uL (ref 1.7–7.7)
Neutrophils Relative %: 62 %
Platelets: 151 10*3/uL (ref 150–400)
RBC: 3.62 MIL/uL — ABNORMAL LOW (ref 3.87–5.11)
RDW: 13.2 % (ref 11.5–15.5)
WBC: 6.5 10*3/uL (ref 4.0–10.5)
nRBC: 0 % (ref 0.0–0.2)

## 2023-07-29 LAB — URINALYSIS, ROUTINE W REFLEX MICROSCOPIC
Bacteria, UA: NONE SEEN
Bilirubin Urine: NEGATIVE
Glucose, UA: NEGATIVE mg/dL
Hgb urine dipstick: NEGATIVE
Ketones, ur: NEGATIVE mg/dL
Leukocytes,Ua: NEGATIVE
Nitrite: NEGATIVE
Protein, ur: 30 mg/dL — AB
Specific Gravity, Urine: 1.003 — ABNORMAL LOW (ref 1.005–1.030)
Squamous Epithelial / HPF: 0 /[HPF] (ref 0–5)
pH: 8 (ref 5.0–8.0)

## 2023-07-29 LAB — ACETAMINOPHEN LEVEL: Acetaminophen (Tylenol), Serum: <10 ug/mL — ABNORMAL LOW (ref 10–30)

## 2023-07-29 LAB — ETHANOL: Alcohol, Ethyl (B): <10 mg/dL (ref <10)

## 2023-07-29 LAB — SALICYLATE LEVEL: Salicylate Lvl: <7 mg/dL — ABNORMAL LOW (ref 7.0–30.0)

## 2023-07-29 MED ORDER — IOHEXOL 350 MG/ML SOLN
75.0000 mL | Freq: Once | INTRAVENOUS | Status: AC | PRN
  Administered 2023-07-29: 75 mL via INTRAVENOUS

## 2023-07-29 MED ORDER — PANTOPRAZOLE SODIUM 40 MG PO TBEC: 40.0000 mg | DELAYED_RELEASE_TABLET | Freq: Two times a day (BID) | ORAL | 1 refills | Status: DC

## 2023-07-29 NOTE — Addendum Note (Signed)
Addended by: Rita Ohara D on: 07/29/2023 01:56 PM   Modules accepted: Orders

## 2023-07-29 NOTE — Telephone Encounter (Signed)
See other phone note

## 2023-07-29 NOTE — ED Triage Notes (Signed)
Pt brought in by EMS for altered mental status. Family stated she was disoriented  but was back to her baseline by the time EMS got there. HX of HTN and HF. On arrival, pt ANOx4. Pt said she's been having headaches this week and that is not normal.  EMS vitals: BP 186/82 97.6 F 94 SPO2 91 BS

## 2023-07-29 NOTE — ED Notes (Signed)
This NT assisted pt to the commode using a walker. Pt is now back in bed and resting comfortably.

## 2023-07-29 NOTE — Telephone Encounter (Signed)
Protonix bid sent in. Per patient, she is doing better. Scheduled with GI 3/12 and placed on waitlist for sooner appt.

## 2023-07-29 NOTE — Discharge Instructions (Addendum)
Please follow-up with your primary care provider for reassessment within the week.  I have also sent neurology a referral for you to follow-up with them outpatient for any potential recurrence of symptoms.  Please return for any worsening symptoms.  Separately, there was incidental findings on the CT of a couple pulmonary nodules on the right side.  Follow this up with your primary care provider for repeat chest CT in the next 3 to 6 months.  I also noted as moderate narrowing of one of the arteries going into your neck.  Please make her PCP aware of this for outpatient follow-up.

## 2023-07-29 NOTE — ED Provider Notes (Signed)
[facility] Provider Note    Event Date/Time   First MD Initiated Contact with Patient 07/29/23 1618     (approximate)   History   Altered Mental Status   HPI Kimberly Roy is a 89 y.o. female with history of pulmonary HTN, HFpEF, HTN, HLD, dementia, asthma, hypothyroidism presenting today for altered mental status.  EMS was called after patient reportedly had approximately 15-minute episode of disorientation noted by caregiver and family member at home.  On arrival to the ED, her symptoms had completely resolved.  During the episode, EMS and family report that there was no slurred speech, facial droop, numbness, weakness anywhere.  Patient herself reports that recently she has had some worsening pain with urination given on top of her chronic interstitial cystitis.  No recent antibiotic usage.  Otherwise denies currently or recently having chest pain, shortness of breath, abdominal pain, nausea, vomiting, diarrhea, constipation, cough, congestion.  No prior history of CVA.  Reviewed most recent primary care provider notes with constipation history.      Physical Exam   Triage Vital Signs: ED Triage Vitals  Encounter Vitals Group     BP 07/29/23 1619 (!) 186/79     Systolic BP Percentile --      Diastolic BP Percentile --      Pulse Rate 07/29/23 1619 100     Resp 07/29/23 1619 18     Temp --      Temp src --      SpO2 07/29/23 1619 93 %     Weight --      Height --      Head Circumference --      Peak Flow --      Pain Score 07/29/23 1620 0     Pain Loc --      Pain Education --      Exclude from Growth Chart --     Most recent vital signs: Vitals:   07/29/23 2030 07/29/23 2032  BP: (!) 199/84   Pulse: 80   Resp:    Temp:  97.7 F (36.5 C)  SpO2: 95%    Physical Exam: I have reviewed the vital signs and nursing notes. General: Awake, alert, no acute distress.  Nontoxic appearing. Head:  Atraumatic, normocephalic.   ENT:  EOM intact,  PERRL. Oral mucosa is pink and moist with no lesions. Neck: Neck is supple with full range of motion, No meningeal signs. Cardiovascular:  RRR, No murmurs. Peripheral pulses palpable and equal bilaterally. Respiratory:  Symmetrical chest wall expansion.  No rhonchi, rales, or wheezes.  Good air movement throughout.  No use of accessory muscles.   Musculoskeletal:  No cyanosis or edema. Moving extremities with full ROM Abdomen:  Soft, nontender, nondistended. Neuro:  GCS 15, moving all four extremities, interacting appropriately. Alert and oriented with cogent speech; 5/5 motor strength all 4 extremities with intact peripheral nerve distributions; gross sensory intact to challenge in all 4 extremities and peripheral nerve distributions; no clonus; cerebellar examination normal including Romberg, finger-to-nose, heel-to-shin, and without trunkal ataxia, dysdiadochokinesis or dysmetria; cranial nerves 2-12 intact to gross challenge bilaterally. Psych:  Calm, appropriate.   Skin:  Warm, dry, no rash.    ED Results / Procedures / Treatments   Labs (all labs ordered are listed, but only abnormal results are displayed) Labs Reviewed  CBC WITH DIFFERENTIAL/PLATELET - Abnormal; Notable for the following components:      Result Value   RBC 3.62 (*)  MCV 102.8 (*)    All other components within normal limits  COMPREHENSIVE METABOLIC PANEL - Abnormal; Notable for the following components:   Glucose, Bld 106 (*)    All other components within normal limits  SALICYLATE LEVEL - Abnormal; Notable for the following components:   Salicylate Lvl <7.0 (*)    All other components within normal limits  ACETAMINOPHEN LEVEL - Abnormal; Notable for the following components:   Acetaminophen (Tylenol), Serum <10 (*)    All other components within normal limits  URINALYSIS, ROUTINE W REFLEX MICROSCOPIC - Abnormal; Notable for the following components:   Color, Urine COLORLESS (*)    APPearance CLEAR (*)     Specific Gravity, Urine 1.003 (*)    Protein, ur 30 (*)    All other components within normal limits  ETHANOL     EKG My EKG interpretation: Rate of 82, sinus rhythm with occasional PAC.  Normal axis, normal intervals.  Right bundle branch block.  No acute ST elevations or depressions   RADIOLOGY Independently interpreted CT head and CTA head and neck with no acute neurological process   PROCEDURES:  Critical Care performed: No  Procedures   MEDICATIONS ORDERED IN ED: Medications  iohexol (OMNIPAQUE) 350 MG/ML injection 75 mL (75 mLs Intravenous Contrast Given 07/29/23 2022)     IMPRESSION / MDM / ASSESSMENT AND PLAN / ED COURSE  I reviewed the triage vital signs and the nursing notes.                              Differential diagnosis includes, but is not limited to, UTI, dehydration, lower suspicion for CVA  Patient's presentation is most consistent with acute presentation with potential threat to life or bodily function.  Patient is a 89 year old female presenting today for 15-minute period of disorientation.  No other reported strokelike symptoms such as weakness, numbness, slurred speech, or facial droop.  On arrival, patient does not have any symptoms.  Only recent symptom is dysuria concerning for possible UTI as source of disorientation.  Will get laboratory workup, UA, and CT head for further evaluation.  Physical exam otherwise reassuring with no acute neurological findings present.  CT head with no acute findings.  Laboratory workup overall reassuring and UA negative.  Reassessed patient with no ongoing neurological symptoms.  Patient has low back stimulator for which she cannot get an MRI.  I have very low suspicion for CVA given her disorientation symptoms being the only neurologic finding which have resolved.  Will get CTA head/neck for further evaluation.  This shows no other acute pathology.  Did make note to family of incidental finding of pulmonary nodules as  well as stenosis of one of the arteries in the neck.  Patient is otherwise stable and can follow-up with her primary care provider as well as a referral for neurology visit outpatient.  They are given strict return precautions and agreeable with plan.  The patient is on the cardiac monitor to evaluate for evidence of arrhythmia and/or significant heart rate changes. Clinical Course as of 07/29/23 2158  Tue Jul 29, 2023  2003 SpO2(!): 87 % Incorrectly documented.  Not hypoxic nor bradycardic in the room. [DW]  2148 CT ANGIO HEAD NECK W WO CM (CODE STROKE) 1. Negative CTA for large vessel occlusion or other emergent finding. 2. Mild atheromatous change about the carotid bifurcations and carotid siphons without hemodynamically significant or correctable stenosis. 3. Moderate stenosis involving  the distal right V2 segment at the level of the right C3 transverse foramen due to extrinsic compression from uncovertebral disease. 4. Aortic Atherosclerosis (ICD10-I70.0). 5. Few bilateral pulmonary nodules, largest of which measures 6 mm at the right upper lobe  [DW]    Clinical Course User Index [DW] Janith Lima, MD     FINAL CLINICAL IMPRESSION(S) / ED DIAGNOSES   Final diagnoses:  Disorientation  Pulmonary nodule     Rx / DC Orders   ED Discharge Orders          Ordered    Ambulatory referral to Neurology       Comments: An appointment is requested in approximately: 2 weeks   07/29/23 2158             Note:  This document was prepared using Dragon voice recognition software and may include unintentional dictation errors.   Janith Lima, MD 07/29/23 2159

## 2023-07-30 ENCOUNTER — Telehealth: Payer: Self-pay

## 2023-07-30 ENCOUNTER — Encounter: Payer: Self-pay | Admitting: Internal Medicine

## 2023-07-30 ENCOUNTER — Telehealth: Payer: Self-pay | Admitting: Internal Medicine

## 2023-07-30 ENCOUNTER — Other Ambulatory Visit (HOSPITAL_COMMUNITY): Payer: Self-pay

## 2023-07-30 NOTE — Telephone Encounter (Signed)
Pt called wanting to let the cma know she was in the ER last night

## 2023-07-30 NOTE — Telephone Encounter (Signed)
FYI- Called pt to follow up with her regarding ED visit. She is feeling back to her normal self. No acute issues. Just wanted to be sure you were able to see ED notes. Patient did not feel ED f/u was necessary. Has f/u in January and will let me know if she changes her mind.

## 2023-07-30 NOTE — Telephone Encounter (Signed)
Noted. Reviewed w/up.  I would like to arrange a f/u with neurology.  Also, would like earlier appt to discuss further w/up and f/u.  If any return of symptoms or problems, needs to be seen.

## 2023-07-30 NOTE — Transitions of Care (Post Inpatient/ED Visit) (Signed)
07/30/2023  Name: Kimberly Roy MRN: [id number] DOB: [birth year]  Today's TOC FU Call Status: Today's TOC FU Call Status:: Successful TOC FU Call Completed TOC FU Call Complete Date: 07/30/23 Patient's Name and Date of Birth confirmed.  Transition Care Management Follow-up Telephone Call Date of Discharge: 07/29/23 Discharge Facility: [facility]) Type of Discharge: Emergency Department Reason for ED Visit: Other: (disorientation) How have you been since you were released from the hospital?: Better Any questions or concerns?: No  Items Reviewed: Did you receive and understand the discharge instructions provided?: Yes Medications obtained,verified, and reconciled?: Yes (Medications Reviewed) Any new allergies since your discharge?: No Dietary orders reviewed?: NA Do you have support at home?: Yes People in Home: child(ren), adult  Medications Reviewed Today: Medications Reviewed Today     Reviewed by Karena Addison, LPN (Licensed Practical Nurse) on 07/30/23 at 1531  Med List Status: <None>   Medication Order Taking? Sig Documenting Provider Last Dose Status Informant  albuterol (VENTOLIN HFA) 108 (90 Base) MCG/ACT inhaler [id number] No INHALE 2 PUFFS INTO THE LUNGS EVERY 6 HOURS AS NEEDED FOR WHEEZING OR SHORTNESS OF Wandra Scot, MD Taking Active Self  aluminum-magnesium hydroxide-simethicone (MAALOX) 200-200-20 MG/5ML SUSP [id number] No Take 15 mLs by mouth 4 (four) times daily -  before meals and at bedtime.  [provider] Taking Active Self           Med Note Nelia Shi   Fri Jul 12, 2022  1:32 AM)    amLODipine (NORVASC) 10 MG tablet [id number] No TAKE 1 TABLET BY MOUTH DAILY Dale [place], MD Taking Active   aspirin 81 MG EC tablet [id number] No Take 81 mg by mouth daily. Every other day [provider] Taking Active Self  atorvastatin (LIPITOR) 20 MG tablet [id number] No TAKE 1 TABLET BY MOUTH AT BEDTIME  Dale [place], MD Taking Active   benazepril (LOTENSIN) 40 MG tablet [id number] No TAKE 1 TABLET BY MOUTH DAILY Dale [place], MD Taking Active   betamethasone dipropionate 0.05 % cream [id number] No Apply 1 application topically 2 (two) times daily. [provider] Taking Active Self           Med Note Aundria Rud, Landry Dyke   Fri Jul 12, 2022  1:32 AM)    budesonide-formoterol [facility]) 80-4.5 MCG/ACT inhaler [id number] No Inhale 2 puffs into the lungs in the morning and at bedtime. [name], MD Taking Active   busPIRone (BUSPAR) 10 MG tablet [id number] No Take 10 mg by mouth 3 (three) times daily. [provider] Taking Active Self  calcium citrate-vitamin D (CITRACAL+D) 315-200 MG-UNIT tablet [id number] No Take 2 tablets by mouth daily. [provider] Taking Active Self  Cholecalciferol (VITAMIN D3) 1000 UNITS CAPS [id number] No Take 1 capsule by mouth daily. [provider] Taking Active Self  clobetasol cream (TEMOVATE) 0.05 % [id number] No Apply 1 application topically See admin instructions. Apply to affected areas of body 1 - 2 times daily as needed for itchy bumps. Avoid applying to face, groin, and axilla. Use as directed. Willeen Niece, MD Taking Active Self  cyanocobalamin (VITAMIN B12) 1000 MCG/ML injection [id number]  INJECT 1 ML INTO THE MUSCLE EVERY 3 MONTHS. [name], MD  Active   Diclofenac Sodium 3 % GEL [id number] No Apply topically 2 (two) times daily. [provider] Taking Active Self           Med Note Aundria Rud, Landry Dyke   Fri Jul 12, 2022  1:32 AM)    Docusate Sodium (DSS) 100 MG CAPS [id number] No Take 100 mg by mouth 2 (two) times daily. [provider] Taking Active Self  DULoxetine (CYMBALTA) 60 MG capsule [id number] No Take 60 mg by mouth daily. [provider] Taking Active Self  Fluocinolone Acetonide 0.01 % OIL [id number] No Apply 1-2 drops into ears once to twice daily as needed. Willeen Niece, MD Taking Active   fluocinonide (LIDEX) 0.05 % external solution [id number] No Apply topically 2 (two) times daily. Apply drops to scalp for itch Willeen Niece, MD Taking Active   fluticasone [facility]) 50 MCG/ACT nasal spray [id number] No Place 2 sprays into both nostrils daily. Dale [place], MD Taking Active   furosemide (LASIX) 20 MG tablet [id number]  TAKE 1 TABLET BY MOUTH DAILY AS NEEDED Dale [place], MD  Active   gabapentin (NEURONTIN) 300 MG capsule [id number]  TAKE 2 CAPSULES BY MOUTH 3 TIMES A DAY (IN THE MORNING, MIDDAY AND AT BEDTIME). Dale [place], MD  Active   HYDROcodone-acetaminophen [facility]) 10-325 MG tablet [id number] No Take 1 tablet by mouth 3 (three) times daily. Enedina Finner, MD Taking Active Self  Infant Care Products [facility]) [place] [id number] No Apply topically as directed (as discussed). Dale [place], MD Taking Active   ipratropium (ATROVENT) 0.03 % nasal spray [id number] No Place 2 sprays into both nostrils every 12 (twelve) hours. Dale [place], MD Taking Active Self  ketoconazole (NIZORAL) 2 % shampoo [id number] No Apply 1 Application topically See admin instructions. apply three times per week, massage into scalp and leave in for 10 minutes before rinsing out Willeen Niece, MD Taking Active   levalbuterol [facility]) 0.63 MG/3ML nebulizer solution [id number] No Take 3 mLs (0.63 mg total) by nebulization every 6 (six) hours as needed for wheezing or shortness of breath. Dale [place], MD Taking Active   LINZESS 145 MCG CAPS capsule [id number] No Take 145 mcg by mouth daily. [provider] Taking Active Self  magnesium oxide (MAG-OX) 400 MG tablet [id number] No Take 1 tablet (400 mg total) by mouth daily. Dale [place], MD Taking Active Self  metoprolol succinate (TOPROL-XL) 50 MG 24 hr tablet [id number] No TAKE 1 TABLET BY MOUTH DAILY. TAKE WITH OR IMMEDIATELY FOLLOWING A MEAL. Dale [employer], MD Taking Active   morphine (MS CONTIN) 30 MG 12 hr tablet  [id number] No Take 30 mg by mouth every 12 (twelve) hours. [provider] Taking Active Self  morphine (MS CONTIN) 60 MG 12 hr tablet [id number] No Take 1 tablet (60 mg total) by mouth 2 (two) times daily. Enedina Finner, MD Taking Active Self  mupirocin ointment (BACTROBAN) 2 % [id number] No Apply 1 Application topically 2 (two) times daily. Dale [place], MD Taking Active   nystatin (MYCOSTATIN/NYSTOP) powder [id number] No Apply 1 application. topically 2 (two) times daily as needed. Dale [place], MD Taking Active Self  nystatin cream (MYCOSTATIN) [id number] No Apply 1 Application topically 2 (two) times daily. Dale [place], MD Taking Active Self  pantoprazole (PROTONIX) 40 MG tablet [id number]  Take 1 tablet (40 mg total) by mouth 2 (two) times daily. Dale [place], MD  Active   polyethylene glycol powder (GLYCOLAX/MIRALAX) 17 GM/SCOOP powder [id number] No MIX 17 GRAMS AS MARKED ON BOTTLE TOP IN 8 OUNCES OF WATER AND DRINK ONCE A DAY AS DIRECTED. Dale , MD Taking Active Self  Simethicone (GAS-X PO) [id number] No Take 2 tablets by mouth 2 (two) times daily. [provider] Taking Active Self  sodium chloride (OCEAN) 0.65 % nasal  spray [id number] No Place 1 spray into the nose as needed. [provider] Taking Active Self  SYNTHROID 100 MCG tablet [id number] No TAKE 1 TABLET BY MOUTH DAILY Dale [place], MD Taking Active   triamcinolone ointment (KENALOG) 0.1 % [id number] No APPLY TWICE DAILY TO BITES AND RASH UNTIL FLAT AND SMOOTH **DO NOT APPLY TO FACEDale [place]ian, MD Taking Active Self            Home Care and Equipment/Supplies: Were Home Health Services Ordered?: NA Any new equipment or medical supplies ordered?: NA  Functional Questionnaire: Do you need assistance with bathing/showering or dressing?: No Do you need assistance with meal preparation?: No Do you need assistance with eating?: No Do you have difficulty maintaining continence: No Do  you need assistance with getting out of bed/getting out of a chair/moving?: No Do you have difficulty managing or taking your medications?: No  Follow up appointments reviewed: PCP Follow-up appointment confirmed?: No (declined appt) MD Provider Line Number:[phone] Given: No Specialist Hospital Follow-up appointment confirmed?: NA Do you need transportation to your follow-up appointment?: No Do you understand care options if your condition(s) worsen?: Yes-patient verbalized understanding    SIGNATURE Karena Addison, LPN [facility] Nurse Health Advisor Direct Dial [phone]

## 2023-07-30 NOTE — Telephone Encounter (Signed)
Pt is in the process of being referred to Neurology and getting a f/u with Dr Darrold Junker. Would like to hold off on appt for now.

## 2023-08-01 ENCOUNTER — Telehealth: Payer: Self-pay

## 2023-08-01 NOTE — Telephone Encounter (Signed)
Pharmacy Patient Advocate Encounter  Received notification from [facility] that Prior Authorization for Pantoprazole Sodium 40MG  dr tablets has been APPROVED from 08/01/2023 to 09/01/2024   PA #/Case ID/Reference #: [id number]

## 2023-08-01 NOTE — Telephone Encounter (Signed)
Pharmacy Patient Advocate Encounter   Received notification from CoverMyMeds that prior authorization for Pantoprazole Sodium 40MG  dr tablets is required/requested.   Insurance verification completed.   The patient is insured through [employer] .   Per test claim: PA required; PA submitted to above mentioned insurance via CoverMyMeds Key/confirmation #/[facility] Status is pending

## 2023-08-04 ENCOUNTER — Telehealth: Payer: Self-pay

## 2023-08-04 NOTE — Telephone Encounter (Signed)
Patient called to [facility] has called in a drug for her.  Patient states she would like for [place] to please call this drug in today since she has to have someone pick it up for her and they can pick it up tomorrow.   Patient states she isn't sure which medication she is calling about.

## 2023-08-05 ENCOUNTER — Encounter: Payer: Self-pay | Admitting: Neurology

## 2023-08-05 DIAGNOSIS — R001 Bradycardia, unspecified: Secondary | ICD-10-CM | POA: Diagnosis not present

## 2023-08-05 NOTE — Telephone Encounter (Signed)
LMTCB. Need to know which medication

## 2023-08-05 NOTE — Telephone Encounter (Signed)
noted 

## 2023-08-05 NOTE — Telephone Encounter (Signed)
Patient states she is returning a call from Rita Ohara, LPN.  Patient states she will call the pharmacy to find out the name of the medication and call us back.

## 2023-08-13 DIAGNOSIS — I10 Essential (primary) hypertension: Secondary | ICD-10-CM | POA: Diagnosis not present

## 2023-08-18 ENCOUNTER — Ambulatory Visit: Payer: Medicare HMO | Admitting: Internal Medicine

## 2023-08-18 ENCOUNTER — Other Ambulatory Visit: Payer: Medicare HMO

## 2023-08-18 ENCOUNTER — Ambulatory Visit: Payer: Medicare HMO | Admitting: Dermatology

## 2023-08-18 DIAGNOSIS — D492 Neoplasm of unspecified behavior of bone, soft tissue, and skin: Secondary | ICD-10-CM

## 2023-08-18 DIAGNOSIS — D485 Neoplasm of uncertain behavior of skin: Secondary | ICD-10-CM

## 2023-08-18 DIAGNOSIS — L720 Epidermal cyst: Secondary | ICD-10-CM | POA: Diagnosis not present

## 2023-08-18 MED ORDER — MUPIROCIN 2 % EX OINT: 1.0000 | TOPICAL_OINTMENT | Freq: Every day | CUTANEOUS | 1 refills | Status: DC

## 2023-08-18 MED ORDER — DOXYCYCLINE MONOHYDRATE 100 MG PO CAPS: 100.0000 mg | ORAL_CAPSULE | Freq: Two times a day (BID) | ORAL | 0 refills | Status: AC

## 2023-08-18 NOTE — Patient Instructions (Signed)
Wound Care Instructions for After Surgery  On the day following your surgery, you should begin doing daily dressing changes until your sutures are removed: Remove the bandage. Cleanse the wound gently with soap and water.  Make sure you then dry the skin surrounding the wound completely or the tape will not stick to the skin. Do not use cotton balls on the wound. After the wound is clean and dry, apply the ointment (either prescription antibiotic prescribed by your doctor or plain Vaseline if nothing was prescribed) gently with a Q-tip. If you are using a bandaid to cover: Apply a bandaid large enough to cover the entire wound. If you do not have a bandaid large enough to cover the wound OR if you are sensitive to bandaid adhesive: Cut a non-stick pad (such as Telfa) to fit the size of the wound.  Cover the wound with the non-stick pad. If the wound is draining, you may want to add a small amount of gauze on top of the non-stick pad for a little added compression to the area. Use tape to seal the area completely.  For the next 1-2 weeks: Be sure to keep the wound moist with ointment 24/7 to ensure best healing. If you are unable to cover the wound with a bandage to hold the ointment in place, you may need to reapply the ointment several times a day. Do not bend over or lift heavy items to reduce the chance of elevated blood pressure to the wound. Do not participate in particularly strenuous activities.  Below is a list of dressing supplies you might need.  Cotton-tipped applicators - Q-tips Gauze pads (2x2 and/or 4x4) - All-Purpose Sponges New and clean tube of petroleum jelly (Vaseline) OR prescription antibiotic ointment if prescribed Either a bandaid large enough to cover the entire wound OR non-stick dressing material (Telfa) and Tape (Paper or Hypafix)  FOR ADULT SURGERY PATIENTS: If you need something for pain relief, you may take 1 extra strength Tylenol (acetaminophen) and 2  ibuprofen (200 mg) together every 4 hours as needed. (Do not take these medications if you are allergic to them or if you know you cannot take them for any other reason). Typically you may only need pain medication for 1-3 days.   Comments on the Post-Operative Period Slight swelling and redness often appear around the wound. This is normal and will disappear within several days following the surgery. The healing wound will drain a brownish-red-yellow discharge during healing. This is a normal phase of wound healing. As the wound begins to heal, the drainage may increase in amount. Again, this drainage is normal. Notify us if the drainage becomes persistently bloody, excessively swollen, or intensely painful or develops a foul odor or red streaks.  The healing wound will also typically be itchy. This is normal. If you have severe or persistent pain, Notify us if the discomfort is severe or persistent. Avoid alcoholic beverages when taking pain medicine.  In Case of Wound Hemorrhage A wound hemorrhage is when the bandage suddenly becomes soaked with bright red blood and flows profusely. If this happens, sit down or lie down with your head elevated. If the wound has a dressing on it, do not remove the dressing. Apply pressure to the existing gauze. If the wound is not covered, use a gauze pad to apply pressure and continue applying the pressure for 20 minutes without peeking. DO NOT COVER THE WOUND WITH A LARGE TOWEL OR WASH CLOTH. Release your hand from the  wound site but do not remove the dressing. If the bleeding has stopped, gently clean around the wound. Leave the dressing in place for 24 hours if possible. This wait time allows the blood vessels to close off so that you do not spark a new round of bleeding by disrupting the newly clotted blood vessels with an immediate dressing change. If the bleeding does not subside, continue to hold pressure for 40 minutes. If bleeding continues, page your  physician, contact an After Hours clinic or go to the Emergency Room.    Due to recent changes in healthcare laws, you may see results of your pathology and/or laboratory studies on MyChart before the doctors have had a chance to review them. We understand that in some cases there may be results that are confusing or concerning to you. Please understand that not all results are received at the same time and often the doctors may need to interpret multiple results in order to provide you with the best plan of care or course of treatment. Therefore, we ask that you please give [place] 2 business days to thoroughly review all your results before contacting the office for clarification. Should we see a critical lab result, you will be contacted sooner.   If You Need Anything After Your Visit  If you have any questions or concerns for your doctor, please call our main line at [phone] and press option 4 to reach your doctor's medical assistant. If no one answers, please leave a voicemail as directed and we will return your call as soon as possible. Messages left after 4 pm will be answered the following business day.   You may also send [place] a message via MyChart. We typically respond to MyChart messages within 1-2 business days.  For prescription refills, please ask your pharmacy to contact our office. Our fax number is [phone].  If you have an urgent issue when the clinic is closed that cannot wait until the next business day, you can page your doctor at the number below.    Please note that while we do our best to be available for urgent issues outside of office hours, we are not available 24/7.   If you have an urgent issue and are unable to reach [place], you may choose to seek medical care at your doctor's office, retail clinic, urgent care center, or emergency room.  If you have a medical emergency, please immediately call 911 or go to the emergency department.  Pager Numbers  - Dr. Gwen Pounds:  [phone]  - Dr. Roseanne Reno: [phone]  - Dr. Katrinka Blazing: [phone]   In the event of inclement weather, please call our main line at [phone] for an update on the status of any delays or closures.  Dermatology Medication Tips: Please keep the boxes that topical medications come in in order to help keep track of the instructions about where and how to use these. Pharmacies typically print the medication instructions only on the boxes and not directly on the medication tubes.   If your medication is too expensive, please contact our office at [phone] option 4 or send [place] a message through MyChart.   We are unable to tell what your co-pay for medications will be in advance as this is different depending on your insurance coverage. However, we may be able to find a substitute medication at lower cost or fill out paperwork to get insurance to cover a needed medication.   If a prior authorization is required to get your  medication covered by your insurance company, please allow [place] 1-2 business days to complete this process.  Drug prices often vary depending on where the prescription is filled and some pharmacies may offer cheaper prices.  The website www.goodrx.com contains coupons for medications through different pharmacies. The prices here do not account for what the cost may be with help from insurance (it may be cheaper with your insurance), but the website can give you the price if you did not use any insurance.  - You can print the associated coupon and take it with your prescription to the pharmacy.  - You may also stop by our office during regular business hours and pick up a GoodRx coupon card.  - If you need your prescription sent electronically to a different pharmacy, notify our office through [facility] or by phone at [phone] option 4.     Si Usted Necesita Algo Despus de Su Visita  Tambin puede enviarnos un mensaje a travs de Clinical cytogeneticist. Por lo general  respondemos a los mensajes de MyChart en el transcurso de 1 a 2 das hbiles.  Para renovar recetas, por favor pida a su farmacia que se ponga en contacto con nuestra oficina. Annie Sable de fax es [place] [phone].  Si tiene un asunto urgente cuando la clnica est cerrada y que no puede esperar hasta el siguiente da hbil, puede llamar/localizar a su doctor(a) al nmero que aparece a continuacin.   Por favor, tenga en cuenta que aunque hacemos todo lo posible para estar disponibles para asuntos urgentes fuera del horario de [place], no estamos disponibles las 24 horas del da, los 7 [address]  [address] de la [place].   Si tiene un problema urgente y no puede comunicarse con nosotros, puede optar por buscar atencin mdica  en el consultorio de su doctor(a), en una clnica privada, en un centro de atencin urgente o en una sala de emergencias.  Si tiene Engineer, drilling, por favor llame inmediatamente al 911 o vaya a la sala de emergencias.  Nmeros de bper  - Dr. Gwen Pounds: [phone]  - Dra. Roseanne Reno: [phone]  - Dr. Katrinka Blazing: [phone]   En caso de inclemencias del tiempo, por favor llame a Lacy Duverney principal al [phone] para una actualizacin sobre el [place] de cualquier retraso o cierre.  Consejos para la medicacin en dermatologa: Por favor, guarde las cajas en las que vienen los medicamentos de uso tpico para ayudarle a seguir las instrucciones sobre dnde y cmo usarlos. Las farmacias generalmente imprimen las instrucciones del medicamento slo en las cajas y no directamente en los tubos del [place].   Si su medicamento es muy caro, por favor, pngase en contacto con Rolm Gala llamando al [phone] y presione la opcin 4 o envenos un mensaje a travs de Clinical cytogeneticist.   No podemos decirle cul ser su copago por los medicamentos por adelantado ya que esto es diferente dependiendo de la cobertura de su seguro. Sin embargo, es posible que podamos encontrar un  medicamento sustituto a Audiological scientist un formulario para que el seguro cubra el medicamento que se considera necesario.   Si se requiere una autorizacin previa para que su compaa de seguros [place] su medicamento, por favor permtanos de 1 a 2 das hbiles para completar [address]  Los precios de los medicamentos varan con frecuencia dependiendo del Environmental consultant de dnde se surte la receta y alguna farmacias pueden ofrecer precios ms baratos.  El sitio web www.goodrx.com tiene cupones para medicamentos de Health and safety inspector. Los  precios aqu no tienen en cuenta lo que podra costar con la ayuda del seguro (puede ser ms barato con su seguro), pero el sitio web puede darle el precio si no utiliz Tourist information centre manager.  - Puede imprimir el cupn correspondiente y llevarlo con su receta a la farmacia.  - Tambin puede pasar por nuestra oficina durante el horario de atencin regular y Education officer, museum una tarjeta de cupones de GoodRx.  - Si necesita que su receta se enve electrnicamente a una farmacia diferente, informe a nuestra oficina a travs de MyChart de [place] o por telfono llamando al [phone] y presione la opcin 4.

## 2023-08-18 NOTE — Progress Notes (Signed)
   Follow-Up Visit   Subjective  Kimberly Roy is a 89 y.o. female who presents for the following: Excision of cyst of the spinal upper back.    The following portions of the chart were reviewed this encounter and updated as appropriate: medications, allergies, medical history  Review of Systems:  No other skin or systemic complaints except as noted in HPI or Assessment and Plan.  Objective  Well appearing patient in no apparent distress; mood and affect are within normal limits.  A focused examination was performed of the following areas: Back  Relevant physical exam findings are noted in the Assessment and Plan.  Spinal Upper Back 2.8 x 2.5 cm Firm white sub q nodule with punctum.  Assessment & Plan   NEOPLASM OF UNCERTAIN BEHAVIOR OF SKIN Spinal Upper Back Skin excision  Lesion length (cm):  2.8 Lesion width (cm):  2.5 Margin per side (cm):  0.1 Total excision diameter (cm):  3 Informed consent: discussed and consent obtained   Timeout: patient name, date of birth, surgical site, and procedure verified   Procedure prep:  Patient was prepped and draped in usual sterile fashion Prep type:  Povidone-iodine Anesthesia: the lesion was anesthetized in a standard fashion   Anesthetic:  1% lidocaine w/ epinephrine 1-100,000 buffered w/ 8.4% NaHCO3 (Total 12cc - 6cc lido w/epi, 6cc bupivacaine) Instrument used: #15 blade   Hemostasis achieved with: pressure   Outcome: patient tolerated procedure well with no complications    Skin repair Complexity:  Intermediate Final length (cm):  5.5 Informed consent: discussed and consent obtained   Timeout: patient name, date of birth, surgical site, and procedure verified   Reason for type of repair: reduce tension to allow closure, reduce the risk of dehiscence, infection, and necrosis, reduce subcutaneous dead space and avoid a hematoma, preserve normal anatomical and functional relationships and enhance both functionality and cosmetic  results   Undermining: edges undermined   Subcutaneous layers (deep stitches):  Suture size:  3-0 Suture type: Vicryl (polyglactin 910)   Stitches:  Buried vertical mattress Fine/surface layer approximation (top stitches):  Suture size:  3-0 Suture type: nylon   Stitches: simple interrupted   Suture removal (days):  7 Hemostasis achieved with: suture Outcome: patient tolerated procedure well with no complications   Post-procedure details: sterile dressing applied and wound care instructions given   Dressing type: pressure dressing (mupirocin)    mupirocin ointment (BACTROBAN) 2 % Apply 1 Application topically daily. Qd to excision site Specimen 1 - Surgical pathology Differential Diagnosis: Cyst vs other Check Margins: No Start Doxycycline 100mg  1 po bid with food and drink for 7 days Start Mupirocin oint qd to excision site  Doxycycline should be taken with food to prevent nausea. Do not lay down for 30 minutes after taking. Be cautious with sun exposure and use good sun protection while on this medication. Pregnant women should not take this medication.     Return in about 1 week (around 08/25/2023) for suture removal. Tx ISK crown on f/u.Wendee Beavers, CMA, am acting as scribe for Willeen Niece, MD .   Documentation: I have reviewed the above documentation for accuracy and completeness, and I agree with the above.  Willeen Niece, MD

## 2023-08-19 DIAGNOSIS — M519 Unspecified thoracic, thoracolumbar and lumbosacral intervertebral disc disorder: Secondary | ICD-10-CM | POA: Diagnosis not present

## 2023-08-19 DIAGNOSIS — Q675 Congenital deformity of spine: Secondary | ICD-10-CM | POA: Diagnosis not present

## 2023-08-19 DIAGNOSIS — Z5181 Encounter for therapeutic drug level monitoring: Secondary | ICD-10-CM | POA: Diagnosis not present

## 2023-08-19 DIAGNOSIS — M19011 Primary osteoarthritis, right shoulder: Secondary | ICD-10-CM | POA: Diagnosis not present

## 2023-08-19 DIAGNOSIS — M19012 Primary osteoarthritis, left shoulder: Secondary | ICD-10-CM | POA: Diagnosis not present

## 2023-08-19 DIAGNOSIS — M48061 Spinal stenosis, lumbar region without neurogenic claudication: Secondary | ICD-10-CM | POA: Diagnosis not present

## 2023-08-19 DIAGNOSIS — Z79899 Other long term (current) drug therapy: Secondary | ICD-10-CM | POA: Diagnosis not present

## 2023-08-19 DIAGNOSIS — S76312A Strain of muscle, fascia and tendon of the posterior muscle group at thigh level, left thigh, initial encounter: Secondary | ICD-10-CM | POA: Diagnosis not present

## 2023-08-19 DIAGNOSIS — M47812 Spondylosis without myelopathy or radiculopathy, cervical region: Secondary | ICD-10-CM | POA: Diagnosis not present

## 2023-08-19 DIAGNOSIS — M5416 Radiculopathy, lumbar region: Secondary | ICD-10-CM | POA: Diagnosis not present

## 2023-08-20 LAB — SURGICAL PATHOLOGY

## 2023-08-22 ENCOUNTER — Inpatient Hospital Stay: Payer: Medicare HMO | Attending: Internal Medicine | Admitting: Internal Medicine

## 2023-08-22 ENCOUNTER — Inpatient Hospital Stay: Payer: Medicare HMO

## 2023-08-22 ENCOUNTER — Encounter: Payer: Self-pay | Admitting: Internal Medicine

## 2023-08-22 DIAGNOSIS — Z881 Allergy status to other antibiotic agents status: Secondary | ICD-10-CM | POA: Diagnosis not present

## 2023-08-22 DIAGNOSIS — D539 Nutritional anemia, unspecified: Secondary | ICD-10-CM | POA: Diagnosis not present

## 2023-08-22 DIAGNOSIS — M47812 Spondylosis without myelopathy or radiculopathy, cervical region: Secondary | ICD-10-CM | POA: Insufficient documentation

## 2023-08-22 DIAGNOSIS — R5383 Other fatigue: Secondary | ICD-10-CM | POA: Insufficient documentation

## 2023-08-22 DIAGNOSIS — D649 Anemia, unspecified: Secondary | ICD-10-CM

## 2023-08-22 DIAGNOSIS — D693 Immune thrombocytopenic purpura: Secondary | ICD-10-CM | POA: Diagnosis not present

## 2023-08-22 DIAGNOSIS — Z9049 Acquired absence of other specified parts of digestive tract: Secondary | ICD-10-CM | POA: Diagnosis not present

## 2023-08-22 DIAGNOSIS — I6523 Occlusion and stenosis of bilateral carotid arteries: Secondary | ICD-10-CM | POA: Diagnosis not present

## 2023-08-22 DIAGNOSIS — Z888 Allergy status to other drugs, medicaments and biological substances status: Secondary | ICD-10-CM | POA: Diagnosis not present

## 2023-08-22 DIAGNOSIS — M2550 Pain in unspecified joint: Secondary | ICD-10-CM | POA: Insufficient documentation

## 2023-08-22 DIAGNOSIS — Z823 Family history of stroke: Secondary | ICD-10-CM | POA: Insufficient documentation

## 2023-08-22 DIAGNOSIS — Z86718 Personal history of other venous thrombosis and embolism: Secondary | ICD-10-CM | POA: Insufficient documentation

## 2023-08-22 DIAGNOSIS — M549 Dorsalgia, unspecified: Secondary | ICD-10-CM | POA: Diagnosis not present

## 2023-08-22 DIAGNOSIS — Z8719 Personal history of other diseases of the digestive system: Secondary | ICD-10-CM | POA: Insufficient documentation

## 2023-08-22 DIAGNOSIS — Z79899 Other long term (current) drug therapy: Secondary | ICD-10-CM | POA: Diagnosis not present

## 2023-08-22 DIAGNOSIS — I6782 Cerebral ischemia: Secondary | ICD-10-CM | POA: Insufficient documentation

## 2023-08-22 DIAGNOSIS — I272 Pulmonary hypertension, unspecified: Secondary | ICD-10-CM | POA: Diagnosis not present

## 2023-08-22 DIAGNOSIS — I10 Essential (primary) hypertension: Secondary | ICD-10-CM | POA: Diagnosis not present

## 2023-08-22 DIAGNOSIS — E039 Hypothyroidism, unspecified: Secondary | ICD-10-CM | POA: Insufficient documentation

## 2023-08-22 DIAGNOSIS — I509 Heart failure, unspecified: Secondary | ICD-10-CM | POA: Insufficient documentation

## 2023-08-22 DIAGNOSIS — Z90710 Acquired absence of both cervix and uterus: Secondary | ICD-10-CM | POA: Diagnosis not present

## 2023-08-22 DIAGNOSIS — R0600 Dyspnea, unspecified: Secondary | ICD-10-CM | POA: Diagnosis not present

## 2023-08-22 DIAGNOSIS — I700 Atherosclerosis of aorta: Secondary | ICD-10-CM | POA: Diagnosis not present

## 2023-08-22 DIAGNOSIS — Z882 Allergy status to sulfonamides status: Secondary | ICD-10-CM | POA: Diagnosis not present

## 2023-08-22 DIAGNOSIS — Z8249 Family history of ischemic heart disease and other diseases of the circulatory system: Secondary | ICD-10-CM | POA: Insufficient documentation

## 2023-08-22 LAB — BASIC METABOLIC PANEL WITH GFR
Anion gap: 11 (ref 5–15)
BUN: 18 mg/dL (ref 8–23)
CO2: 29 mmol/L (ref 22–32)
Calcium: 9.6 mg/dL (ref 8.9–10.3)
Chloride: 98 mmol/L (ref 98–111)
Creatinine, Ser: 0.79 mg/dL (ref 0.44–1.00)
GFR, Estimated: >60 mL/min
Glucose, Bld: 87 mg/dL (ref 70–99)
Potassium: 3.9 mmol/L (ref 3.5–5.1)
Sodium: 138 mmol/L (ref 135–145)

## 2023-08-22 LAB — CBC WITH DIFFERENTIAL (CANCER CENTER ONLY)
Abs Immature Granulocytes: 0.02 10*3/uL (ref 0.00–0.07)
Basophils Absolute: 0 10*3/uL (ref 0.0–0.1)
Basophils Relative: 0 %
Eosinophils Absolute: 0.2 10*3/uL (ref 0.0–0.5)
Eosinophils Relative: 3 %
HCT: 32.8 % — ABNORMAL LOW (ref 36.0–46.0)
Hemoglobin: 10.6 g/dL — ABNORMAL LOW (ref 12.0–15.0)
Immature Granulocytes: 0 %
Lymphocytes Relative: 24 %
Lymphs Abs: 1.2 10*3/uL (ref 0.7–4.0)
MCH: 33.7 pg (ref 26.0–34.0)
MCHC: 32.3 g/dL (ref 30.0–36.0)
MCV: 104.1 fL — ABNORMAL HIGH (ref 80.0–100.0)
Monocytes Absolute: 0.4 10*3/uL (ref 0.1–1.0)
Monocytes Relative: 7 %
Neutro Abs: 3.2 10*3/uL (ref 1.7–7.7)
Neutrophils Relative %: 66 %
Platelet Count: 147 10*3/uL — ABNORMAL LOW (ref 150–400)
RBC: 3.15 MIL/uL — ABNORMAL LOW (ref 3.87–5.11)
RDW: 13.4 % (ref 11.5–15.5)
WBC Count: 4.9 10*3/uL (ref 4.0–10.5)
nRBC: 0 % (ref 0.0–0.2)

## 2023-08-22 LAB — IRON AND TIBC
Iron: 106 ug/dL (ref 28–170)
Saturation Ratios: 44 % — ABNORMAL HIGH (ref 10.4–31.8)
TIBC: 244 ug/dL — ABNORMAL LOW (ref 250–450)
UIBC: 138 ug/dL

## 2023-08-22 LAB — VITAMIN B12: Vitamin B-12: 428 pg/mL (ref 180–914)

## 2023-08-22 LAB — FERRITIN: Ferritin: 112 ng/mL (ref 11–307)

## 2023-08-22 NOTE — Progress Notes (Signed)
Pt feels fatigue. Has dyspnea with ambulation. Denies any dizziness. No blood in stool. Appetite is good. Cyst removed from back on Monday. Nerve blocks in neck on Dec 30th.

## 2023-08-22 NOTE — Progress Notes (Signed)
[facility] CONSULT NOTE  Patient Care Team: Dale [place], MD as PCP - General (Internal Medicine) [name], MD as Attending Physician (Cardiology) [name], MD (Ophthalmology) [name], MD (Otolaryngology) [name], MD as Consulting Physician (Hematology and Oncology)  CHIEF COMPLAINTS/PURPOSE OF CONSULTATION: Anemia  # CHRONIC MILD ANEMIA hb ~11.[Previous Dr.Gittin pt]; DEC 2017- work up [employer]; stool card; Iron/ monoclonal work up- NEGATIVE] ; MARCH 2022- B12 WNL. BONE MARROW BIOPSY-JUNE 2023- DIAGNOSIS:   BONE MARROW, ASPIRATE, CLOT, CORE:  -Overall normocellular bone marrow with orderly trilineage  hematopoiesis, see note.  -  Essentially absent histiocytic iron stores   PERIPHERAL BLOOD:  -Macrocytic anemia and thrombocytopenia.   NOTE:  While the biopsy is inadequate for interpretation the overall aspirate  and clot section are adequate and revealed an otherwise normocellular  bone marrow.  While there is orderly maturation and unremarkable  morphology to the myeloid and erythroid lineages a compensatory  erythroid hyperplasia with left shift is not definitively identified.  There is a mild megakaryocytic hyperplasia with some atypical forms;  however, this may simply be a compensatory megakaryocytic hyperplasia  with left shift.  There is no definitive dysplasia in any of the 3  lineages.  Given the borderline plasmacytosis by aspirate morphology  immunohistochemical stains were performed identifying a normal number of  polyclonal plasma cells.   It should be noted that while my indices did anemia is macrocytic there  is essentially absent histiocytic iron stores.  A morphologic etiology  for the patient's anemia and thrombocytopenia is not identified.  Correlation with pending conventional cytogenetics is recommended to  fully exclude a possible myelodysplastic syndrome.  If clinically  indicated additional molecular  testing such as a next generation myeloid  panel could be considered.   # CHRONIC INTERSTITIAL CYSTITIS/ wheel chair bound  Oncology History   No history exists.    HISTORY OF PRESENTING ILLNESS:  In wheel chair.  Patient's caregiver- family.   Kimberly Roy 89 y.o.  female  with a history of chronic macrocytic anemia- > 10 years of unclear etiology and mild intermittent thrombocytopenia [clinically suspicious for MDS although not proven on bone marrow biopsy 2023]; mild B12 deficiency is here for follow-up.  Patient feels fatigue. Has dyspnea with ambulation. Denies any dizziness. No blood in stool. Appetite is good. Cyst removed from back on Monday. Nerve blocks in neck on Dec 30th.   Continues to have chronic fatigue.  No weight loss no night sweats.  Review of Systems  Constitutional:  Positive for malaise/fatigue. Negative for chills, diaphoresis and fever.  HENT:  Negative for nosebleeds and sore throat.   Eyes:  Negative for double vision.  Respiratory:  Negative for cough, hemoptysis, sputum production, shortness of breath and wheezing.   Cardiovascular:  Negative for chest pain, palpitations, orthopnea and leg swelling.  Gastrointestinal:  Negative for abdominal pain, blood in stool, constipation, diarrhea, heartburn, melena, nausea and vomiting.  Genitourinary:  Negative for dysuria, frequency and urgency.  Musculoskeletal:  Positive for back pain and joint pain.  Skin: Negative.  Negative for itching and rash.  Neurological:  Negative for dizziness, tingling, focal weakness, weakness and headaches.  Endo/Heme/Allergies:  Does not bruise/bleed easily.  Psychiatric/Behavioral:  Negative for depression. The patient is not nervous/anxious and does not have insomnia.      MEDICAL HISTORY:  Past Medical History:  Diagnosis Date   Anemia    Anxiety    Chest pain    CHF (congestive heart  failure) (HCC)    Constipation    DDD (degenerative disc disease), cervical     Depression    DVT (deep venous thrombosis) (HCC)    Dysphonia    Dyspnea    Fatty liver    Fatty liver    Headache    Hyperlipidemia    Hyperpiesia    Hypertension    Hypothyroidism    Interstitial cystitis    Left ventricular dysfunction    Lymphedema    Nephrolithiasis    Obstructive sleep apnea    Osteoarthritis    knees/cervical and lumbar spine   Pulmonary hypertension (HCC)    Pulmonary nodules    followed by Dr Meredeth Ide   Pure hypercholesterolemia    Renal cyst    right    SURGICAL HISTORY: Past Surgical History:  Procedure Laterality Date   ABDOMINAL HYSTERECTOMY     ovaries left in place   APPENDECTOMY     Back Surgeries     BACK SURGERY     BREAST REDUCTION SURGERY     3/99   CARDIAC CATHETERIZATION     cataracts Bilateral    CERVICAL SPINE SURGERY     ESOPHAGEAL MANOMETRY N/A 08/02/2015   Procedure: ESOPHAGEAL MANOMETRY (EM);  Surgeon: Elnita Maxwell, MD;  Location: [facility] ENDOSCOPY;  Service: Endoscopy;  Laterality: N/A;   ESOPHAGOGASTRODUODENOSCOPY N/A 02/27/2015   Procedure: ESOPHAGOGASTRODUODENOSCOPY (EGD);  Surgeon: Wallace Cullens, MD;  Location[facility] ENDOSCOPY;  Service: Gastroenterology;  Laterality: N/A;   ESOPHAGOGASTRODUODENOSCOPY (EGD) WITH PROPOFOL N/A 10/30/2018   Procedure: ESOPHAGOGASTRODUODENOSCOPY (EGD) WITH PROPOFOL;  Surgeon: Christena Deem, MD;  Location: [facility] ENDOSCOPY;  Service: Endoscopy;  Laterality: N/A;   EXCISIONAL HEMORRHOIDECTOMY     EYE SURGERY     FRACTURE SURGERY     HEMORRHOID SURGERY     HIP SURGERY  2013   Right hip surgery   JOINT REPLACEMENT     KNEE ARTHROSCOPY     left and right   ORIF FEMUR FRACTURE Right 08/07/2018   Procedure: OPEN REDUCTION INTERNAL FIXATION (ORIF) DISTAL FEMUR FRACTURE;  Surgeon: Kennedy Bucker, MD;  Location: [facility] ORS;  Service: Orthopedics;  Laterality: Right;   REDUCTION MAMMAPLASTY Bilateral YRS AGO   REPLACEMENT TOTAL KNEE Bilateral    rotator cuff surgery     blilateral    TONSILECTOMY/ADENOIDECTOMY WITH MYRINGOTOMY     VISCERAL ARTERY INTERVENTION N/A 02/25/2019   Procedure: VISCERAL ARTERY INTERVENTION;  Surgeon: Annice Needy, MD;  Location: [facility] INVASIVE CV LAB;  Service: Cardiovascular;  Laterality: N/A;    SOCIAL HISTORY: Social History   Socioeconomic History   Marital status: Widowed    Spouse name: Not on file   Number of children: 2   Years of education: 12   Highest education level: Associate degree: occupational, Scientist, product/process development, or vocational program  Occupational History    Comment: Engineer, site  Tobacco Use   Smoking status: Never   Smokeless tobacco: Never  Vaping Use   Vaping status: Never Used  Substance and Sexual Activity   Alcohol use: No    Alcohol/week: 0.0 standard drinks of alcohol   Drug use: Never   Sexual activity: Not Currently  Other Topics Concern   Not on file  Social History Narrative   Not on file   Social Drivers of Health   Financial Resource Strain: Low Risk  (07/20/2023)   Overall Financial Resource Strain (CARDIA)    Difficulty of Paying Living Expenses: Not hard at all  Food Insecurity: No Food Insecurity (  07/20/2023)   Hunger Vital Sign    Worried About Running Out of Food in the Last Year: Never true    Ran Out of Food in the Last Year: Never true  Transportation Needs: No Transportation Needs (07/20/2023)   PRAPARE - Administrator, Civil Service (Medical): No    Lack of Transportation (Non-Medical): No  Physical Activity: Inactive (07/20/2023)   Exercise Vital Sign    Days of Exercise per Week: 0 days    Minutes of Exercise per Session: 10 min  Stress: No Stress Concern Present (07/20/2023)   [employer] of Occupational Health - Occupational Stress Questionnaire    Feeling of Stress : Only a little  Social Connections: Socially Isolated (07/20/2023)   Social Connection and Isolation Panel [NHANES]    Frequency of Communication with Friends and Family: More than three times a  week    Frequency of Social Gatherings with Friends and Family: Twice a week    Attends Religious Services: Never    Database administrator or Organizations: No    Attends Banker Meetings: Never    Marital Status: Widowed  Intimate Partner Violence: Not At Risk (11/25/2022)   Humiliation, Afraid, Rape, and Kick questionnaire    Fear of Current or Ex-Partner: No    Emotionally Abused: No    Physically Abused: No    Sexually Abused: No    FAMILY HISTORY: Family History  Problem Relation Age of Onset   Heart disease Mother    Stroke Mother    Hypertension Mother    Heart disease Father        myocardial infarction age 66   Breast cancer Neg Hx     ALLERGIES:  is allergic to lyrica [pregabalin], omnicef [cefdinir], atarax [hydroxyzine], dicyclomine, hydroxyzine hcl, levaquin [levofloxacin], nitrofurantoin, nucynta er [tapentadol hcl er], oxybutynin, zoloft [sertraline hcl], biaxin [clarithromycin], meloxicam, other, sertraline, silicone, sulfa antibiotics, sulfasalazine, tape, tapentadol, and venofer [iron sucrose].  MEDICATIONS:  Current Outpatient Medications  Medication Sig Dispense Refill   albuterol (VENTOLIN HFA) 108 (90 Base) MCG/ACT inhaler INHALE 2 PUFFS INTO THE LUNGS EVERY 6 HOURS AS NEEDED FOR WHEEZING OR SHORTNESS OF BREATH 18 g 0   aluminum-magnesium hydroxide-simethicone (MAALOX) 200-200-20 MG/5ML SUSP Take 15 mLs by mouth 4 (four) times daily -  before meals and at bedtime.      amLODipine (NORVASC) 10 MG tablet TAKE 1 TABLET BY MOUTH DAILY 90 tablet 1   aspirin 81 MG EC tablet Take 81 mg by mouth daily. Every other day     atorvastatin (LIPITOR) 20 MG tablet TAKE 1 TABLET BY MOUTH AT BEDTIME 90 tablet 1   benazepril (LOTENSIN) 40 MG tablet TAKE 1 TABLET BY MOUTH DAILY 90 tablet 1   betamethasone dipropionate 0.05 % cream Apply 1 application topically 2 (two) times daily.     budesonide-formoterol (SYMBICORT) 80-4.5 MCG/ACT inhaler Inhale 2 puffs into the  lungs in the morning and at bedtime. 1 each 12   busPIRone (BUSPAR) 10 MG tablet Take 10 mg by mouth 3 (three) times daily.     calcium citrate-vitamin D (CITRACAL+D) 315-200 MG-UNIT tablet Take 2 tablets by mouth daily.     Cholecalciferol (VITAMIN D3) 1000 UNITS CAPS Take 1 capsule by mouth daily.     clobetasol cream (TEMOVATE) 0.05 % Apply 1 application topically See admin instructions. Apply to affected areas of body 1 - 2 times daily as needed for itchy bumps. Avoid applying to face, groin, and axilla. Use  as directed. 60 g 1   cyanocobalamin (VITAMIN B12) 1000 MCG/ML injection INJECT 1 ML INTO THE MUSCLE EVERY 3 MONTHS. 12 mL 0   Diclofenac Sodium 3 % GEL Apply topically 2 (two) times daily.     Docusate Sodium (DSS) 100 MG CAPS Take 100 mg by mouth 2 (two) times daily.     doxycycline (MONODOX) 100 MG capsule Take 1 capsule (100 mg total) by mouth 2 (two) times daily for 7 days. Take with food and drink 14 capsule 0   DULoxetine (CYMBALTA) 60 MG capsule Take 60 mg by mouth daily.     Fluocinolone Acetonide 0.01 % OIL Apply 1-2 drops into ears once to twice daily as needed. 20 mL 3   fluocinonide (LIDEX) 0.05 % external solution Apply topically 2 (two) times daily. Apply drops to scalp for itch 60 mL 3   fluticasone (FLONASE) 50 MCG/ACT nasal spray Place 2 sprays into both nostrils daily. 16 g 6   furosemide (LASIX) 20 MG tablet TAKE 1 TABLET BY MOUTH DAILY AS NEEDED 90 tablet 0   gabapentin (NEURONTIN) 300 MG capsule TAKE 2 CAPSULES BY MOUTH 3 TIMES A DAY (IN THE MORNING, MIDDAY AND AT BEDTIME). 540 capsule 1   HYDROcodone-acetaminophen (NORCO) 10-325 MG tablet Take 1 tablet by mouth 3 (three) times daily. 10 tablet 0   Infant [employer] [facility]) OINT Apply topically as directed (as discussed). 430 g 1   ipratropium (ATROVENT) 0.03 % nasal spray Place 2 sprays into both nostrils every 12 (twelve) hours. 30 mL 1   ketoconazole (NIZORAL) 2 % shampoo Apply 1 Application topically See  admin instructions. apply three times per week, massage into scalp and leave in for 10 minutes before rinsing out 120 mL 3   levalbuterol (XOPENEX) 0.63 MG/3ML nebulizer solution Take 3 mLs (0.63 mg total) by nebulization every 6 (six) hours as needed for wheezing or shortness of breath. 3 mL 1   LINZESS 145 MCG CAPS capsule Take 145 mcg by mouth daily.     magnesium oxide (MAG-OX) 400 MG tablet Take 1 tablet (400 mg total) by mouth daily. 30 tablet 1   metoprolol tartrate (LOPRESSOR) 25 MG tablet Take 25 mg by mouth daily.     morphine (MS CONTIN) 30 MG 12 hr tablet Take 30 mg by mouth every 12 (twelve) hours.     morphine (MS CONTIN) 60 MG 12 hr tablet Take 1 tablet (60 mg total) by mouth 2 (two) times daily. 10 tablet 0   mupirocin ointment (BACTROBAN) 2 % Apply 1 Application topically daily. Qd to excision site 22 g 1   nystatin (MYCOSTATIN/NYSTOP) powder Apply 1 application. topically 2 (two) times daily as needed. 60 g 0   nystatin cream (MYCOSTATIN) Apply 1 Application topically 2 (two) times daily. 60 g 0   pantoprazole (PROTONIX) 40 MG tablet Take 1 tablet (40 mg total) by mouth 2 (two) times daily. 180 tablet 1   polyethylene glycol powder (GLYCOLAX/MIRALAX) 17 GM/SCOOP powder MIX 17 GRAMS AS MARKED ON BOTTLE TOP IN 8 OUNCES OF WATER AND DRINK ONCE A DAY AS DIRECTED. 527 g 0   Simethicone (GAS-X PO) Take 2 tablets by mouth 2 (two) times daily.     sodium chloride (OCEAN) 0.65 % nasal spray Place 1 spray into the nose as needed.     SYNTHROID 100 MCG tablet TAKE 1 TABLET BY MOUTH DAILY 90 tablet 3   triamcinolone ointment (KENALOG) 0.1 % APPLY TWICE DAILY TO BITES AND RASH  UNTIL FLAT AND SMOOTH **DO NOT APPLY TO FACE** 80 g 0   No current facility-administered medications for this visit.      Marland Kitchen  PHYSICAL EXAMINATION: ECOG PERFORMANCE STATUS: 0 - Asymptomatic  Vitals:   08/22/23 1314  BP: (!) 120/48  Pulse: (!) 55  Resp: 18  Temp: 98.6 F (37 C)  SpO2: 98%   Filed Weights    08/22/23 1314  Weight: 180 lb (81.6 kg)     Physical Exam Constitutional:      Comments: Patient in wheelchair because of chronic arthritic problems.  HENT:     Head: Normocephalic and atraumatic.     Mouth/Throat:     Pharynx: No oropharyngeal exudate.  Eyes:     Pupils: Pupils are equal, round, and reactive to light.  Cardiovascular:     Rate and Rhythm: Normal rate and regular rhythm.  Pulmonary:     Effort: Pulmonary effort is normal. No respiratory distress.     Breath sounds: Normal breath sounds. No wheezing.  Abdominal:     General: Bowel sounds are normal. There is no distension.     Palpations: Abdomen is soft. There is no mass.     Tenderness: There is no abdominal tenderness. There is no guarding or rebound.  Musculoskeletal:        General: No tenderness. Normal range of motion.     Cervical back: Normal range of motion and neck supple.  Skin:    General: Skin is warm.  Neurological:     Mental Status: She is alert and oriented to person, place, and time.  Psychiatric:        Mood and Affect: Affect normal.     LABORATORY DATA:  I have reviewed the data as listed Lab Results  Component Value Date   WBC 4.9 08/22/2023   HGB 10.6 (L) 08/22/2023   HCT 32.8 (L) 08/22/2023   MCV 104.1 (H) 08/22/2023   PLT 147 (L) 08/22/2023   Recent Labs    02/12/23 1408 05/19/23 1217 06/24/23 1418 07/22/23 1225 07/29/23 1627 08/22/23 1255  NA 142 141   < > 141 138 138  K 4.1 4.5   < > 4.5 3.9 3.9  CL 100 102   < > 103 100 98  CO2 30 31   < > 30 29 29   GLUCOSE 84 75   < > 101* 106* 87  BUN 23 16   < > 21 16 18   CREATININE 0.81 0.78   < > 0.84 0.79 0.79  CALCIUM 9.6 9.3   < > 9.6 9.6 9.6  GFRNONAA >60  --   --   --  >60 >60  PROT 6.7 6.2  --  6.1 7.6  --   ALBUMIN 3.9 3.8  --  4.1 4.6  --   AST 21 18  --  14 22  --   ALT 15 9  --  8 13  --   ALKPHOS 82 87  --  86 93  --   BILITOT 0.2* 0.4  --  0.5 0.9  --   BILIDIR  --  0.1  --  0.1  --   --    < > =  values in this interval not displayed.    RADIOGRAPHIC STUDIES: I have personally reviewed the radiological images as listed and agreed with the findings in the report. CT ANGIO HEAD NECK W WO CM (CODE STROKE) Result Date: 07/29/2023 CLINICAL DATA:  Initial evaluation for acute TIA.  EXAM: CT ANGIOGRAPHY HEAD AND NECK WITH AND WITHOUT CONTRAST TECHNIQUE: Multidetector CT imaging of the head and neck was performed using the standard protocol during bolus administration of intravenous contrast. Multiplanar CT image reconstructions and MIPs were obtained to evaluate the vascular anatomy. Carotid stenosis measurements (when applicable) are obtained utilizing NASCET criteria, using the distal internal carotid diameter as the denominator. RADIATION DOSE REDUCTION: This exam was performed according to the departmental dose-optimization program which includes automated exposure control, adjustment of the mA and/or kV according to patient size and/or use of iterative reconstruction technique. CONTRAST:  75mL OMNIPAQUE IOHEXOL 350 MG/ML SOLN COMPARISON:  CT from earlier the same day. FINDINGS: CTA NECK FINDINGS Aortic arch: Visualized aortic arch within normal limits for caliber with standard branch pattern. Aortic atherosclerosis. No stenosis about the origin the great vessels. Right carotid system: Right common and internal carotid arteries are patent without dissection. Atheromatous change about the right carotid bulb with estimated 40% stenosis by NASCET criteria. Left carotid system: Left common and internal carotid arteries are patent without dissection. Calcified plaque about the left carotid bulb without hemodynamically significant greater than 50% stenosis. Vertebral arteries: Both vertebral arteries arise from subclavian arteries. No dissection. Moderate stenosis involving the distal right V2 segment at the level of the right C3 transverse foramen due to extrinsic compression from uncovertebral disease (series  8, image 186). No other stenosis. Skeleton: No discrete or worrisome osseous lesions. Moderate to advanced cervical spondylosis, most pronounced at C3-4 and C4-5. Other neck: No other acute finding. Upper chest: 6 mm right upper lobe pulmonary nodule (series 6, image 162). Additional 4 mm left upper lobe pulmonary nodule (series 6, image 176). No other acute finding. Review of the MIP images confirms the above findings CTA HEAD FINDINGS Anterior circulation: Mild atheromatous change about the carotid siphons without stenosis. A1 segments, anterior communicating artery complex common anterior cerebral arteries widely patent. No M1 stenosis or occlusion. Distal MCA branches perfused and symmetric. Posterior circulation: Both V4 segments patent without significant stenosis. Right PICA patent. Left PICA not well seen. Basilar patent without stenosis. Superior cerebellar and posterior cerebral arteries patent bilaterally. Venous sinuses: Patent allowing for timing the contrast bolus. Anatomic variants: As above.  No aneurysm. Review of the MIP images confirms the above findings IMPRESSION: 1. Negative CTA for large vessel occlusion or other emergent finding. 2. Mild atheromatous change about the carotid bifurcations and carotid siphons without hemodynamically significant or correctable stenosis. 3. Moderate stenosis involving the distal right V2 segment at the level of the right C3 transverse foramen due to extrinsic compression from uncovertebral disease. 4.  Aortic Atherosclerosis (ICD10-I70.0). 5. Few bilateral pulmonary nodules, largest of which measures 6 mm at the right upper lobe. Per Fleischner Society Guidelines, recommend a non-contrast Chest CT at 3-6 months, then consider another non-contrast Chest CT at 18-24 months. If patient is low risk for malignancy, non-contrast Chest CT at 18-24 months is optional. These guidelines do not apply to immunocompromised patients and patients with cancer. Follow up in  patients with significant comorbidities as clinically warranted. For lung cancer screening, adhere to Lung-RADS guidelines. Reference: Radiology. 2017; 284(1):228-43. Electronically Signed   By: Rise Mu M.D.   On: 07/29/2023 20:57   CT Head Wo Contrast Result Date: 07/29/2023 CLINICAL DATA:  Altered mental status and disorientation. Worsening headache. EXAM: CT HEAD WITHOUT CONTRAST TECHNIQUE: Contiguous axial images were obtained from the base of the skull through the vertex without intravenous contrast. RADIATION DOSE REDUCTION: This exam was performed  according to the departmental dose-optimization program which includes automated exposure control, adjustment of the mA and/or kV according to patient size and/or use of iterative reconstruction technique. COMPARISON:  Head CT dated 02/22/2023. FINDINGS: Brain: Mild age-related atrophy and chronic microvascular ischemic changes. There is no acute intracranial hemorrhage. No mass effect or midline shift. No extra-axial fluid collection. Vascular: No hyperdense vessel or unexpected calcification. Skull: Normal. Negative for fracture or focal lesion. Sinuses/Orbits: There is partial opacification of the left sphenoid sinus. The remainder of the visualized paranasal sinuses and mastoid air cells are clear. Other: None IMPRESSION: 1. No acute intracranial pathology. 2. Mild age-related atrophy and chronic microvascular ischemic changes. Electronically Signed   By: Elgie Collard M.D.   On: 07/29/2023 18:33    ASSESSMENT & PLAN:   Symptomatic anemia # Since 2013 chronic macrocytic anemia-unclear etiology-hemoglobin around 10-mild thrombocytopenia greater than 100 platelets- suspect multifactorial. BONE MARROW- June 2023 all the peripheral blood shows mild macrocytic anemia-bone marrow biopsy shows Overall normocellular bone marrow with orderly trilineage  hematopoiesis,-  Essentially absent histiocytic iron stores; conventional cytogenetics  normal.  Continues to be clinically suspicious MDS; although not proven on bone marrow biopsy.  Would consider Retacrit if hemoglobin does not improve.  If drop in hemoglobin noted consider-NGS testing.  # Today Hb 10.6 Currently On PO iron. [ given Rash sec to IV venofer]; HOLD off venofer for now; continue PO iron. June 2024-iron studies adequate.  # Intermittent low platelets > 100-? ITP-  monitor for now.   # B12 def-[ B12 [July 2023]- 156-currently getting B12 injections at home every 3 months.   Care giver-Transport  # DISPOSITION:  # follow up in 6 months- MD; labs-cbc/bmp; iron studies;ferritin- B12 levels -Dr.B    [name], MD 08/22/2023 2:17 PM

## 2023-08-22 NOTE — Assessment & Plan Note (Addendum)
#   Since 2013 chronic macrocytic anemia-unclear etiology-hemoglobin around 10-mild thrombocytopenia greater than 100 platelets- suspect multifactorial. BONE MARROW- June 2023 all the peripheral blood shows mild macrocytic anemia-bone marrow biopsy shows Overall normocellular bone marrow with orderly trilineage  hematopoiesis,-  Essentially absent histiocytic iron stores; conventional cytogenetics normal.  Continues to be clinically suspicious MDS; although not proven on bone marrow biopsy.  Would consider Retacrit if hemoglobin does not improve.  If drop in hemoglobin noted consider-NGS testing.  # Today Hb 10.6 Currently On PO iron. [ given Rash sec to IV venofer]; HOLD off venofer for now; continue PO iron. June 2024-iron studies adequate.  # Intermittent low platelets > 100-? ITP-  monitor for now.   # B12 def-[ B12 [July 2023]- 156-currently getting B12 injections at home every 3 months.   Care giver-Transport  # DISPOSITION:  # follow up in 6 months- MD; labs-cbc/bmp; iron studies;ferritin- B12 levels -Dr.B

## 2023-08-25 ENCOUNTER — Encounter: Payer: Self-pay | Admitting: Dermatology

## 2023-08-25 ENCOUNTER — Ambulatory Visit: Payer: Medicare HMO | Admitting: Dermatology

## 2023-08-25 DIAGNOSIS — H16223 Keratoconjunctivitis sicca, not specified as Sjogren's, bilateral: Secondary | ICD-10-CM | POA: Diagnosis not present

## 2023-08-25 DIAGNOSIS — L820 Inflamed seborrheic keratosis: Secondary | ICD-10-CM

## 2023-08-25 DIAGNOSIS — L219 Seborrheic dermatitis, unspecified: Secondary | ICD-10-CM | POA: Diagnosis not present

## 2023-08-25 DIAGNOSIS — L729 Follicular cyst of the skin and subcutaneous tissue, unspecified: Secondary | ICD-10-CM

## 2023-08-25 DIAGNOSIS — Z961 Presence of intraocular lens: Secondary | ICD-10-CM | POA: Diagnosis not present

## 2023-08-25 NOTE — Patient Instructions (Signed)
 Due to recent changes in healthcare laws, you may see results of your pathology and/or laboratory studies on MyChart before the doctors have had a chance to review them. We understand that in some cases there may be results that are confusing or concerning to you. Please understand that not all results are received at the same time and often the doctors may need to interpret multiple results in order to provide you with the best plan of care or course of treatment. Therefore, we ask that you please give [place] 2 business days to thoroughly review all your results before contacting the office for clarification. Should we see a critical lab result, you will be contacted sooner.   If You Need Anything After Your Visit  If you have any questions or concerns for your doctor, please call our main line at [phone] and press option 4 to reach your doctor's medical assistant. If no one answers, please leave a voicemail as directed and we will return your call as soon as possible. Messages left after 4 pm will be answered the following business day.   You may also send [place] a message via MyChart. We typically respond to MyChart messages within 1-2 business days.  For prescription refills, please ask your pharmacy to contact our office. Our fax number is [phone].  If you have an urgent issue when the clinic is closed that cannot wait until the next business day, you can page your doctor at the number below.    Please note that while we do our best to be available for urgent issues outside of office hours, we are not available 24/7.   If you have an urgent issue and are unable to reach [place], you may choose to seek medical care at your doctor's office, retail clinic, urgent care center, or emergency room.  If you have a medical emergency, please immediately call 911 or go to the emergency department.  Pager Numbers  - Dr. Gwen Pounds: [phone]  - Dr. Roseanne Reno: [phone]  - Dr. Katrinka Blazing: [phone]    In the event of inclement weather, please call our main line at [phone] for an update on the status of any delays or closures.  Dermatology Medication Tips: Please keep the boxes that topical medications come in in order to help keep track of the instructions about where and how to use these. Pharmacies typically print the medication instructions only on the boxes and not directly on the medication tubes.   If your medication is too expensive, please contact our office at [phone] option 4 or send [place] a message through MyChart.   We are unable to tell what your co-pay for medications will be in advance as this is different depending on your insurance coverage. However, we may be able to find a substitute medication at lower cost or fill out paperwork to get insurance to cover a needed medication.   If a prior authorization is required to get your medication covered by your insurance company, please allow [place] 1-2 business days to complete this process.  Drug prices often vary depending on where the prescription is filled and some pharmacies may offer cheaper prices.  The website www.goodrx.com contains coupons for medications through different pharmacies. The prices here do not account for what the cost may be with help from insurance (it may be cheaper with your insurance), but the website can give you the price if you did not use any insurance.  - You can print the associated coupon and take it  with your prescription to the pharmacy.  - You may also stop by our office during regular business hours and pick up a GoodRx coupon card.  - If you need your prescription sent electronically to a different pharmacy, notify our office through [facility] or by phone at [phone] option 4.     Si Usted Necesita Algo Despus de Su Visita  Tambin puede enviarnos un mensaje a travs de Clinical cytogeneticist. Por lo general respondemos a los mensajes de MyChart en el transcurso de 1 a 2 das  hbiles.  Para renovar recetas, por favor pida a su farmacia que se ponga en contacto con nuestra oficina. Annie Sable de fax es [place] [phone].  Si tiene un asunto urgente cuando la clnica est cerrada y que no puede esperar hasta el siguiente da hbil, puede llamar/localizar a su doctor(a) al nmero que aparece a continuacin.   Por favor, tenga en cuenta que aunque hacemos todo lo posible para estar disponibles para asuntos urgentes fuera del horario de [place], no estamos disponibles las 24 horas del da, los 7 [address]  [address] de la [place].   Si tiene un problema urgente y no puede comunicarse con nosotros, puede optar por buscar atencin mdica  en el consultorio de su doctor(a), en una clnica privada, en un centro de atencin urgente o en una sala de emergencias.  Si tiene Engineer, drilling, por favor llame inmediatamente al 911 o vaya a la sala de emergencias.  Nmeros de bper  - Dr. Gwen Pounds: [phone]  - Dra. Roseanne Reno: [phone]  - Dr. Katrinka Blazing: [phone]   En caso de inclemencias del tiempo, por favor llame a Lacy Duverney principal al [phone] para una actualizacin sobre el [place] de cualquier retraso o cierre.  Consejos para la medicacin en dermatologa: Por favor, guarde las cajas en las que vienen los medicamentos de uso tpico para ayudarle a seguir las instrucciones sobre dnde y cmo usarlos. Las farmacias generalmente imprimen las instrucciones del medicamento slo en las cajas y no directamente en los tubos del [place].   Si su medicamento es muy caro, por favor, pngase en contacto con Rolm Gala llamando al [phone] y presione la opcin 4 o envenos un mensaje a travs de Clinical cytogeneticist.   No podemos decirle cul ser su copago por los medicamentos por adelantado ya que esto es diferente dependiendo de la cobertura de su seguro. Sin embargo, es posible que podamos encontrar un medicamento sustituto a Audiological scientist un formulario para que el  seguro cubra el medicamento que se considera necesario.   Si se requiere una autorizacin previa para que su compaa de seguros [place] su medicamento, por favor permtanos de 1 a 2 das hbiles para completar [address]  Los precios de los medicamentos varan con frecuencia dependiendo del Environmental consultant de dnde se surte la receta y alguna farmacias pueden ofrecer precios ms baratos.  El sitio web www.goodrx.com tiene cupones para medicamentos de Health and safety inspector. Los precios aqu no tienen en cuenta lo que podra costar con la ayuda del seguro (puede ser ms barato con su seguro), pero el sitio web puede darle el precio si no utiliz Tourist information centre manager.  - Puede imprimir el cupn correspondiente y llevarlo con su receta a la farmacia.  - Tambin puede pasar por nuestra oficina durante el horario de atencin regular y Education officer, museum una tarjeta de cupones de GoodRx.  - Si necesita que su receta se enve electrnicamente a Psychiatrist, informe a nuestra oficina a travs de MyChart de  [place] o por telfono llamando al [phone] y presione la opcin 4.

## 2023-08-25 NOTE — Addendum Note (Signed)
Addended by: Willeen Niece on: 08/25/2023 07:36 PM   Modules accepted: Level of Service

## 2023-08-25 NOTE — Progress Notes (Signed)
Follow-Up Visit   Subjective  Kimberly Roy is a 89 y.o. female who presents for the following: cyst bx proven, 1 wk post op, pt presents for suture removal, check spot scalp, irritating, picks at it The patient has spots, moles and lesions to be evaluated, some may be new or changing and the patient may have concern these could be cancer.   The following portions of the chart were reviewed this encounter and updated as appropriate: medications, allergies, medical history  Review of Systems:  No other skin or systemic complaints except as noted in HPI or Assessment and Plan.  Objective  Well appearing patient in no apparent distress; mood and affect are within normal limits.   A focused examination was performed of the following areas: Back, scalp  Relevant exam findings are noted in the Assessment and Plan.  crown scalp x 1 Stuck on waxy paps with erythema  Assessment & Plan   EPIDERMOID CYST Spinal upper back Exam: healing excision site  Treatment Plan: Encounter for Removal of Sutures - Incision site at the spinal upper back is clean, dry and intact - Wound cleansed, sutures removed, wound cleansed and steri strips applied.  - Discussed pathology results showing epidermoid cyst  - Patient advised to keep steri-strips dry until they fall off. - Scars remodel for a full year. - Once steri-strips fall off, patient can apply over-the-counter silicone scar cream each night to help with scar remodeling if desired. - Patient advised to call with any concerns or if they notice any new or changing lesions.    INFLAMED SEBORRHEIC KERATOSIS crown scalp x 1 Symptomatic, irritating, patient would like treated. Destruction of lesion - crown scalp x 1  Destruction method: cryotherapy   Informed consent: discussed and consent obtained   Lesion destroyed using liquid nitrogen: Yes   Region frozen until ice ball extended beyond lesion: Yes   Outcome: patient tolerated procedure  well with no complications   Post-procedure details: wound care instructions given   Additional details:  Prior to procedure, discussed risks of blister formation, small wound, skin dyspigmentation, or rare scar following cryotherapy. Recommend Vaseline ointment to treated areas while healing.   SEBORRHEIC DERMATITIS scalp Exam: mild scaling scalp, pt c/o itching  Chronic and persistent condition with duration or expected duration over one year. Condition is symptomatic/ bothersome to patient. Not currently at goal.   Seborrheic Dermatitis is a chronic persistent rash characterized by pinkness and scaling most commonly of the mid face but also can occur on the scalp (dandruff), ears; mid chest, mid back and groin.  It tends to be exacerbated by stress and cooler weather.  People who have neurologic disease may experience new onset or exacerbation of existing seborrheic dermatitis.  The condition is not curable but treatable and can be controlled.  Treatment Plan: Cont Ketoconazole 2% shampoo 1x/wk, let sit 5-10 minutes and rinse out.   Cont Lidex solution qd/bid to aa itchy scalp  Topical steroids (such as triamcinolone, fluocinolone, fluocinonide, mometasone, clobetasol, halobetasol, betamethasone, hydrocortisone) can cause thinning and lightening of the skin if they are used for too long in the same area. Your physician has selected the right strength medicine for your problem and area affected on the body. Please use your medication only as directed by your physician to prevent side effects.    Return in 6 months (on 02/23/2024) for seb derm, ISK f/u.  I, Ardis Rowan, RMA, am acting as scribe for Willeen Niece, MD .  Documentation: I have reviewed the above documentation for accuracy and completeness, and I agree with the above.  Willeen Niece, MD

## 2023-09-01 DIAGNOSIS — M47812 Spondylosis without myelopathy or radiculopathy, cervical region: Secondary | ICD-10-CM | POA: Diagnosis not present

## 2023-09-09 DIAGNOSIS — G8929 Other chronic pain: Secondary | ICD-10-CM | POA: Diagnosis not present

## 2023-09-09 DIAGNOSIS — M545 Low back pain, unspecified: Secondary | ICD-10-CM | POA: Diagnosis not present

## 2023-09-09 DIAGNOSIS — M5459 Other low back pain: Secondary | ICD-10-CM | POA: Diagnosis not present

## 2023-09-10 ENCOUNTER — Encounter: Payer: Self-pay | Admitting: Internal Medicine

## 2023-09-11 DIAGNOSIS — K080 Exfoliation of teeth due to systemic causes: Secondary | ICD-10-CM | POA: Diagnosis not present

## 2023-09-12 ENCOUNTER — Other Ambulatory Visit: Payer: Self-pay | Admitting: Internal Medicine

## 2023-09-17 ENCOUNTER — Ambulatory Visit: Payer: Medicare Other | Admitting: Internal Medicine

## 2023-09-17 NOTE — Progress Notes (Deleted)
Subjective:    Patient ID: Kimberly Roy, female    DOB: [birth year], 89 y.o.   MRN: [id number]  Patient here for No chief complaint on file.   HPI Here for a scheduled follow up - here to follow up regarding hypertension, GERD and hypercholesterolemia.  Saw cardiology 07/15/23 - decreased metoprolol to 25mg  q day. Recommended heart monitor. 2D echocardiogram 07/14/2022 showed normal ventricular function with LVEF 60 to 65% with mild mitral regurgitation, mild to moderate tricuspid stenosis, and mild aortic stenosis. Overall heart appears to be stable. Saw cardiology 08/13/23 - Continue metoprolol XL 25 mg qd - minimal change on Holter in bradycardia with decreased dose. Pt tolerating well - will continue. Last 2D echocardiogram 07/14/2022 showed normal ventricular function with LVEF 60 to 65% with mild mitral regurgitation, mild to moderate tricuspid stenosis, and mild aortic stenosis. No significant change in ectopy burden since 12/2022 Holter. Saw Dr Donneta Romberg 08/22/23 - hgb 10.6. continue po iron. Continue B12 injections. Saw ortho 09/09/23 - low back pain. Recommended f/u with pain specialist in [place]. S/p ablation - sholder pain. Was prescribed steroid dose pak.    Past Medical History:  Diagnosis Date   Anemia    Anxiety    Chest pain    CHF (congestive heart failure) (HCC)    Constipation    DDD (degenerative disc disease), cervical    Depression    DVT (deep venous thrombosis) (HCC)    Dysphonia    Dyspnea    Fatty liver    Fatty liver    Headache    Hyperlipidemia    Hyperpiesia    Hypertension    Hypothyroidism    Interstitial cystitis    Left ventricular dysfunction    Lymphedema    Nephrolithiasis    Obstructive sleep apnea    Osteoarthritis    knees/cervical and lumbar spine   Pulmonary hypertension (HCC)    Pulmonary nodules    followed by Dr Meredeth Ide   Pure hypercholesterolemia    Renal cyst    right   Past Surgical History:  Procedure Laterality Date    ABDOMINAL HYSTERECTOMY     ovaries left in place   APPENDECTOMY     Back Surgeries     BACK SURGERY     BREAST REDUCTION SURGERY     3/99   CARDIAC CATHETERIZATION     cataracts Bilateral    CERVICAL SPINE SURGERY     ESOPHAGEAL MANOMETRY N/A 08/02/2015   Procedure: ESOPHAGEAL MANOMETRY (EM);  Surgeon: Elnita Maxwell, MD;  Location: [facility] ENDOSCOPY;  Service: Endoscopy;  Laterality: N/A;   ESOPHAGOGASTRODUODENOSCOPY N/A 02/27/2015   Procedure: ESOPHAGOGASTRODUODENOSCOPY (EGD);  Surgeon: Wallace Cullens, MD;  Location: [facility] ENDOSCOPY;  Service: Gastroenterology;  Laterality: N/A;   ESOPHAGOGASTRODUODENOSCOPY (EGD) WITH PROPOFOL N/A 10/30/2018   Procedure: ESOPHAGOGASTRODUODENOSCOPY (EGD) WITH PROPOFOL;  Surgeon: Christena Deem, MD;  Location: [facility] ENDOSCOPY;  Service: Endoscopy;  Laterality: N/A;   EXCISIONAL HEMORRHOIDECTOMY     EYE SURGERY     FRACTURE SURGERY     HEMORRHOID SURGERY     HIP SURGERY  2013   Right hip surgery   JOINT REPLACEMENT     KNEE ARTHROSCOPY     left and right   ORIF FEMUR FRACTURE Right 08/07/2018   Procedure: OPEN REDUCTION INTERNAL FIXATION (ORIF) DISTAL FEMUR FRACTURE;  Surgeon: Kennedy Bucker, MD;  Location: [facility] ORS;  Service: Orthopedics;  Laterality: Right;   REDUCTION MAMMAPLASTY Bilateral YRS AGO   REPLACEMENT TOTAL KNEE Bilateral  rotator cuff surgery     blilateral   TONSILECTOMY/ADENOIDECTOMY WITH MYRINGOTOMY     VISCERAL ARTERY INTERVENTION N/A 02/25/2019   Procedure: VISCERAL ARTERY INTERVENTION;  Surgeon: Annice Needy, MD;  Location: [facility] INVASIVE CV LAB;  Service: Cardiovascular;  Laterality: N/A;   Family History  Problem Relation Age of Onset   Heart disease Mother    Stroke Mother    Hypertension Mother    Heart disease Father        myocardial infarction age 57   Breast cancer Neg Hx    Social History   Socioeconomic History   Marital status: Widowed    Spouse name: Not on file   Number of children: 2   Years of education:  41   Highest education level: Associate degree: occupational, Scientist, product/process development, or vocational program  Occupational History    Comment: Engineer, site  Tobacco Use   Smoking status: Never   Smokeless tobacco: Never  Vaping Use   Vaping status: Never Used  Substance and Sexual Activity   Alcohol use: No    Alcohol/week: 0.0 standard drinks of alcohol   Drug use: Never   Sexual activity: Not Currently  Other Topics Concern   Not on file  Social History Narrative   Not on file   Social Drivers of Health   Financial Resource Strain: Low Risk  (09/16/2023)   Overall Financial Resource Strain (CARDIA)    Difficulty of Paying Living Expenses: Not very hard  Food Insecurity: No Food Insecurity (09/16/2023)   Hunger Vital Sign    Worried About Running Out of Food in the Last Year: Never true    Ran Out of Food in the Last Year: Never true  Transportation Needs: No Transportation Needs (09/16/2023)   PRAPARE - Administrator, Civil Service (Medical): No    Lack of Transportation (Non-Medical): No  Physical Activity: Inactive (09/16/2023)   Exercise Vital Sign    Days of Exercise per Week: 0 days    Minutes of Exercise per Session: 10 min  Stress: No Stress Concern Present (09/16/2023)   [employer] of Occupational Health - Occupational Stress Questionnaire    Feeling of Stress : Not at all  Social Connections: Socially Isolated (09/16/2023)   Social Connection and Isolation Panel [NHANES]    Frequency of Communication with Friends and Family: More than three times a week    Frequency of Social Gatherings with Friends and Family: More than three times a week    Attends Religious Services: Never    Database administrator or Organizations: No    Attends Banker Meetings: Never    Marital Status: Widowed     Review of Systems     Objective:     There were no vitals taken for this visit. Wt Readings from Last 3 Encounters:  08/22/23 180 lb (81.6 kg)   07/29/23 177 lb (80.3 kg)  07/22/23 173 lb (78.5 kg)    Physical Exam  {Perform Simple Foot Exam  Perform Detailed exam:1} {Insert foot Exam (Optional):30965}   Outpatient Encounter Medications as of 09/17/2023  Medication Sig   albuterol (VENTOLIN HFA) 108 (90 Base) MCG/ACT inhaler INHALE 2 PUFFS INTO THE LUNGS EVERY 6 HOURS AS NEEDED FOR WHEEZING OR SHORTNESS OF BREATH   aluminum-magnesium hydroxide-simethicone (MAALOX) 200-200-20 MG/5ML SUSP Take 15 mLs by mouth 4 (four) times daily -  before meals and at bedtime.    amLODipine (NORVASC) 10 MG tablet TAKE 1 TABLET BY MOUTH  DAILY   aspirin 81 MG EC tablet Take 81 mg by mouth daily. Every other day   atorvastatin (LIPITOR) 20 MG tablet TAKE 1 TABLET BY MOUTH AT BEDTIME   benazepril (LOTENSIN) 40 MG tablet TAKE 1 TABLET BY MOUTH DAILY   betamethasone dipropionate 0.05 % cream Apply 1 application topically 2 (two) times daily.   budesonide-formoterol (SYMBICORT) 80-4.5 MCG/ACT inhaler Inhale 2 puffs into the lungs in the morning and at bedtime.   busPIRone (BUSPAR) 10 MG tablet Take 10 mg by mouth 3 (three) times daily.   calcium citrate-vitamin D (CITRACAL+D) 315-200 MG-UNIT tablet Take 2 tablets by mouth daily.   Cholecalciferol (VITAMIN D3) 1000 UNITS CAPS Take 1 capsule by mouth daily.   clobetasol cream (TEMOVATE) 0.05 % Apply 1 application topically See admin instructions. Apply to affected areas of body 1 - 2 times daily as needed for itchy bumps. Avoid applying to face, groin, and axilla. Use as directed.   cyanocobalamin (VITAMIN B12) 1000 MCG/ML injection INJECT 1 ML INTO THE MUSCLE EVERY 3 MONTHS.   Diclofenac Sodium 3 % GEL Apply topically 2 (two) times daily.   Docusate Sodium (DSS) 100 MG CAPS Take 100 mg by mouth 2 (two) times daily.   DULoxetine (CYMBALTA) 60 MG capsule Take 60 mg by mouth daily.   Fluocinolone Acetonide 0.01 % OIL Apply 1-2 drops into ears once to twice daily as needed.   fluocinonide (LIDEX) 0.05 %  external solution Apply topically 2 (two) times daily. Apply drops to scalp for itch   fluticasone (FLONASE) 50 MCG/ACT nasal spray Place 2 sprays into both nostrils daily.   furosemide (LASIX) 20 MG tablet TAKE 1 TABLET BY MOUTH DAILY AS NEEDED   gabapentin (NEURONTIN) 300 MG capsule TAKE 2 CAPSULES BY MOUTH 3 TIMES A DAY (IN THE MORNING, MIDDAY AND AT BEDTIME).   HYDROcodone-acetaminophen (NORCO) 10-325 MG tablet Take 1 tablet by mouth 3 (three) times daily.   Infant Care Products [facility]) OINT Apply topically as directed (as discussed).   ipratropium (ATROVENT) 0.03 % nasal spray Place 2 sprays into both nostrils every 12 (twelve) hours.   ketoconazole (NIZORAL) 2 % shampoo Apply 1 Application topically See admin instructions. apply three times per week, massage into scalp and leave in for 10 minutes before rinsing out   levalbuterol (XOPENEX) 0.63 MG/3ML nebulizer solution Take 3 mLs (0.63 mg total) by nebulization every 6 (six) hours as needed for wheezing or shortness of breath.   LINZESS 145 MCG CAPS capsule Take 145 mcg by mouth daily.   magnesium oxide (MAG-OX) 400 MG tablet Take 1 tablet (400 mg total) by mouth daily.   metoprolol tartrate (LOPRESSOR) 25 MG tablet Take 25 mg by mouth daily.   morphine (MS CONTIN) 30 MG 12 hr tablet Take 30 mg by mouth every 12 (twelve) hours.   morphine (MS CONTIN) 60 MG 12 hr tablet Take 1 tablet (60 mg total) by mouth 2 (two) times daily.   mupirocin ointment (BACTROBAN) 2 % Apply 1 Application topically daily. Qd to excision site   nystatin (MYCOSTATIN/NYSTOP) powder Apply 1 application. topically 2 (two) times daily as needed.   nystatin cream (MYCOSTATIN) Apply 1 Application topically 2 (two) times daily.   pantoprazole (PROTONIX) 40 MG tablet Take 1 tablet (40 mg total) by mouth 2 (two) times daily.   polyethylene glycol powder (GLYCOLAX/MIRALAX) 17 GM/SCOOP powder MIX 17 GRAMS AS MARKED ON BOTTLE TOP IN 8 OUNCES OF WATER AND DRINK ONCE A DAY AS  DIRECTED.  Simethicone (GAS-X PO) Take 2 tablets by mouth 2 (two) times daily.   sodium chloride (OCEAN) 0.65 % nasal spray Place 1 spray into the nose as needed.   SYNTHROID 100 MCG tablet TAKE 1 TABLET BY MOUTH DAILY   triamcinolone ointment (KENALOG) 0.1 % APPLY TWICE DAILY TO BITES AND RASH UNTIL FLAT AND SMOOTH **DO NOT APPLY TO FACE**   No facility-administered encounter medications on file as of 09/17/2023.     Lab Results  Component Value Date   WBC 4.9 08/22/2023   HGB 10.6 (L) 08/22/2023   HCT 32.8 (L) 08/22/2023   PLT 147 (L) 08/22/2023   GLUCOSE 87 08/22/2023   CHOL 125 05/19/2023   TRIG 67.0 05/19/2023   HDL 54.00 05/19/2023   LDLCALC 58 05/19/2023   ALT 13 07/29/2023   AST 22 07/29/2023   NA 138 08/22/2023   K 3.9 08/22/2023   CL 98 08/22/2023   CREATININE 0.79 08/22/2023   BUN 18 08/22/2023   CO2 29 08/22/2023   TSH 2.51 07/22/2023   INR 0.89 08/06/2018   HGBA1C 6.0 10/19/2014    CT ANGIO HEAD NECK W WO CM (CODE STROKE) Result Date: 07/29/2023 CLINICAL DATA:  Initial evaluation for acute TIA. EXAM: CT ANGIOGRAPHY HEAD AND NECK WITH AND WITHOUT CONTRAST TECHNIQUE: Multidetector CT imaging of the head and neck was performed using the standard protocol during bolus administration of intravenous contrast. Multiplanar CT image reconstructions and MIPs were obtained to evaluate the vascular anatomy. Carotid stenosis measurements (when applicable) are obtained utilizing NASCET criteria, using the distal internal carotid diameter as the denominator. RADIATION DOSE REDUCTION: This exam was performed according to the departmental dose-optimization program which includes automated exposure control, adjustment of the mA and/or kV according to patient size and/or use of iterative reconstruction technique. CONTRAST:  75mL OMNIPAQUE IOHEXOL 350 MG/ML SOLN COMPARISON:  CT from earlier the same day. FINDINGS: CTA NECK FINDINGS Aortic arch: Visualized aortic arch within normal limits  for caliber with standard branch pattern. Aortic atherosclerosis. No stenosis about the origin the great vessels. Right carotid system: Right common and internal carotid arteries are patent without dissection. Atheromatous change about the right carotid bulb with estimated 40% stenosis by NASCET criteria. Left carotid system: Left common and internal carotid arteries are patent without dissection. Calcified plaque about the left carotid bulb without hemodynamically significant greater than 50% stenosis. Vertebral arteries: Both vertebral arteries arise from subclavian arteries. No dissection. Moderate stenosis involving the distal right V2 segment at the level of the right C3 transverse foramen due to extrinsic compression from uncovertebral disease (series 8, image 186). No other stenosis. Skeleton: No discrete or worrisome osseous lesions. Moderate to advanced cervical spondylosis, most pronounced at C3-4 and C4-5. Other neck: No other acute finding. Upper chest: 6 mm right upper lobe pulmonary nodule (series 6, image 162). Additional 4 mm left upper lobe pulmonary nodule (series 6, image 176). No other acute finding. Review of the MIP images confirms the above findings CTA HEAD FINDINGS Anterior circulation: Mild atheromatous change about the carotid siphons without stenosis. A1 segments, anterior communicating artery complex common anterior cerebral arteries widely patent. No M1 stenosis or occlusion. Distal MCA branches perfused and symmetric. Posterior circulation: Both V4 segments patent without significant stenosis. Right PICA patent. Left PICA not well seen. Basilar patent without stenosis. Superior cerebellar and posterior cerebral arteries patent bilaterally. Venous sinuses: Patent allowing for timing the contrast bolus. Anatomic variants: As above.  No aneurysm. Review of the MIP images confirms the  above findings IMPRESSION: 1. Negative CTA for large vessel occlusion or other emergent finding. 2. Mild  atheromatous change about the carotid bifurcations and carotid siphons without hemodynamically significant or correctable stenosis. 3. Moderate stenosis involving the distal right V2 segment at the level of the right C3 transverse foramen due to extrinsic compression from uncovertebral disease. 4.  Aortic Atherosclerosis (ICD10-I70.0). 5. Few bilateral pulmonary nodules, largest of which measures 6 mm at the right upper lobe. Per Fleischner Society Guidelines, recommend a non-contrast Chest CT at 3-6 months, then consider another non-contrast Chest CT at 18-24 months. If patient is low risk for malignancy, non-contrast Chest CT at 18-24 months is optional. These guidelines do not apply to immunocompromised patients and patients with cancer. Follow up in patients with significant comorbidities as clinically warranted. For lung cancer screening, adhere to Lung-RADS guidelines. Reference: Radiology. 2017; 284(1):228-43. Electronically Signed   By: Rise Mu M.D.   On: 07/29/2023 20:57   CT Head Wo Contrast Result Date: 07/29/2023 CLINICAL DATA:  Altered mental status and disorientation. Worsening headache. EXAM: CT HEAD WITHOUT CONTRAST TECHNIQUE: Contiguous axial images were obtained from the base of the skull through the vertex without intravenous contrast. RADIATION DOSE REDUCTION: This exam was performed according to the departmental dose-optimization program which includes automated exposure control, adjustment of the mA and/or kV according to patient size and/or use of iterative reconstruction technique. COMPARISON:  Head CT dated 02/22/2023. FINDINGS: Brain: Mild age-related atrophy and chronic microvascular ischemic changes. There is no acute intracranial hemorrhage. No mass effect or midline shift. No extra-axial fluid collection. Vascular: No hyperdense vessel or unexpected calcification. Skull: Normal. Negative for fracture or focal lesion. Sinuses/Orbits: There is partial opacification of the  left sphenoid sinus. The remainder of the visualized paranasal sinuses and mastoid air cells are clear. Other: None IMPRESSION: 1. No acute intracranial pathology. 2. Mild age-related atrophy and chronic microvascular ischemic changes. Electronically Signed   By: Elgie Collard M.D.   On: 07/29/2023 18:33       Assessment & Plan:  There are no diagnoses linked to this encounter.   Dale Volente, MD

## 2023-09-19 ENCOUNTER — Ambulatory Visit: Payer: Medicare Other | Admitting: Internal Medicine

## 2023-09-19 NOTE — Progress Notes (Deleted)
Subjective:    Patient ID: Kimberly Roy, female    DOB: 12-17-1930, 89 y.o.   MRN: [id number]  Patient here for No chief complaint on file.   HPI Here for a scheduled follow up - here to follow up regarding hypertension, GERD and hypercholesterolemia.  Saw cardiology 07/15/23 - decreased metoprolol to 25mg  q day. Recommended heart monitor. 2D echocardiogram 07/14/2022 showed normal ventricular function with LVEF 60 to 65% with mild mitral regurgitation, mild to moderate tricuspid stenosis, and mild aortic stenosis. Overall heart appears to be stable. Saw cardiology 08/13/23 - Continue metoprolol XL 25 mg qd - minimal change on Holter in bradycardia with decreased dose. Pt tolerating well - will continue. Last 2D echocardiogram 07/14/2022 showed normal ventricular function with LVEF 60 to 65% with mild mitral regurgitation, mild to moderate tricuspid stenosis, and mild aortic stenosis. No significant change in ectopy burden since 12/2022 Holter. Saw Dr Donneta Romberg 08/22/23 - hgb 10.6. continue po iron. Continue B12 injections. Saw ortho 09/09/23 - low back pain. Recommended f/u with pain specialist in [place]. S/p ablation - sholder pain. Was prescribed steroid dose pak.    Past Medical History:  Diagnosis Date   Anemia    Anxiety    Chest pain    CHF (congestive heart failure) (HCC)    Constipation    DDD (degenerative disc disease), cervical    Depression    DVT (deep venous thrombosis) (HCC)    Dysphonia    Dyspnea    Fatty liver    Fatty liver    Headache    Hyperlipidemia    Hyperpiesia    Hypertension    Hypothyroidism    Interstitial cystitis    Left ventricular dysfunction    Lymphedema    Nephrolithiasis    Obstructive sleep apnea    Osteoarthritis    knees/cervical and lumbar spine   Pulmonary hypertension (HCC)    Pulmonary nodules    followed by Dr Meredeth Ide   Pure hypercholesterolemia    Renal cyst    right   Past Surgical History:  Procedure Laterality Date    ABDOMINAL HYSTERECTOMY     ovaries left in place   APPENDECTOMY     Back Surgeries     BACK SURGERY     BREAST REDUCTION SURGERY     3/99   CARDIAC CATHETERIZATION     cataracts Bilateral    CERVICAL SPINE SURGERY     ESOPHAGEAL MANOMETRY N/A 08/02/2015   Procedure: ESOPHAGEAL MANOMETRY (EM);  Surgeon: Elnita Maxwell, MD;  Location: [facility] ENDOSCOPY;  Service: Endoscopy;  Laterality: N/A;   ESOPHAGOGASTRODUODENOSCOPY N/A 02/27/2015   Procedure: ESOPHAGOGASTRODUODENOSCOPY (EGD);  Surgeon: Wallace Cullens, MD;  Location: [facility] ENDOSCOPY;  Service: Gastroenterology;  Laterality: N/A;   ESOPHAGOGASTRODUODENOSCOPY (EGD) WITH PROPOFOL N/A 10/30/2018   Procedure: ESOPHAGOGASTRODUODENOSCOPY (EGD) WITH PROPOFOL;  Surgeon: Christena Deem, MD;  Location: [facility] ENDOSCOPY;  Service: Endoscopy;  Laterality: N/A;   EXCISIONAL HEMORRHOIDECTOMY     EYE SURGERY     FRACTURE SURGERY     HEMORRHOID SURGERY     HIP SURGERY  2013   Right hip surgery   JOINT REPLACEMENT     KNEE ARTHROSCOPY     left and right   ORIF FEMUR FRACTURE Right 08/07/2018   Procedure: OPEN REDUCTION INTERNAL FIXATION (ORIF) DISTAL FEMUR FRACTURE;  Surgeon: Kennedy Bucker, MD;  Location: [facility] ORS;  Service: Orthopedics;  Laterality: Right;   REDUCTION MAMMAPLASTY Bilateral YRS AGO   REPLACEMENT TOTAL KNEE Bilateral  rotator cuff surgery     blilateral   TONSILECTOMY/ADENOIDECTOMY WITH MYRINGOTOMY     VISCERAL ARTERY INTERVENTION N/A 02/25/2019   Procedure: VISCERAL ARTERY INTERVENTION;  Surgeon: Annice Needy, MD;  Location: [facility] INVASIVE CV LAB;  Service: Cardiovascular;  Laterality: N/A;   Family History  Problem Relation Age of Onset   Heart disease Mother    Stroke Mother    Hypertension Mother    Heart disease Father        myocardial infarction age 34   Breast cancer Neg Hx    Social History   Socioeconomic History   Marital status: Widowed    Spouse name: Not on file   Number of children: 2   Years of education:  26   Highest education level: Associate degree: occupational, Scientist, product/process development, or vocational program  Occupational History    Comment: Engineer, site  Tobacco Use   Smoking status: Never   Smokeless tobacco: Never  Vaping Use   Vaping status: Never Used  Substance and Sexual Activity   Alcohol use: No    Alcohol/week: 0.0 standard drinks of alcohol   Drug use: Never   Sexual activity: Not Currently  Other Topics Concern   Not on file  Social History Narrative   Not on file   Social Drivers of Health   Financial Resource Strain: Low Risk  (09/16/2023)   Overall Financial Resource Strain (CARDIA)    Difficulty of Paying Living Expenses: Not very hard  Food Insecurity: No Food Insecurity (09/16/2023)   Hunger Vital Sign    Worried About Running Out of Food in the Last Year: Never true    Ran Out of Food in the Last Year: Never true  Transportation Needs: No Transportation Needs (09/16/2023)   PRAPARE - Administrator, Civil Service (Medical): No    Lack of Transportation (Non-Medical): No  Physical Activity: Inactive (09/16/2023)   Exercise Vital Sign    Days of Exercise per Week: 0 days    Minutes of Exercise per Session: 10 min  Stress: No Stress Concern Present (09/16/2023)   [employer] of Occupational Health - Occupational Stress Questionnaire    Feeling of Stress : Not at all  Social Connections: Socially Isolated (09/16/2023)   Social Connection and Isolation Panel [NHANES]    Frequency of Communication with Friends and Family: More than three times a week    Frequency of Social Gatherings with Friends and Family: More than three times a week    Attends Religious Services: Never    Database administrator or Organizations: No    Attends Banker Meetings: Never    Marital Status: Widowed     Review of Systems     Objective:     There were no vitals taken for this visit. Wt Readings from Last 3 Encounters:  08/22/23 180 lb (81.6 kg)   07/29/23 177 lb (80.3 kg)  07/22/23 173 lb (78.5 kg)    Physical Exam  {Perform Simple Foot Exam  Perform Detailed exam:1} {Insert foot Exam (Optional):30965}   Outpatient Encounter Medications as of 09/19/2023  Medication Sig   albuterol (VENTOLIN HFA) 108 (90 Base) MCG/ACT inhaler INHALE 2 PUFFS INTO THE LUNGS EVERY 6 HOURS AS NEEDED FOR WHEEZING OR SHORTNESS OF BREATH   aluminum-magnesium hydroxide-simethicone (MAALOX) 200-200-20 MG/5ML SUSP Take 15 mLs by mouth 4 (four) times daily -  before meals and at bedtime.    amLODipine (NORVASC) 10 MG tablet TAKE 1 TABLET BY MOUTH  DAILY   aspirin 81 MG EC tablet Take 81 mg by mouth daily. Every other day   atorvastatin (LIPITOR) 20 MG tablet TAKE 1 TABLET BY MOUTH AT BEDTIME   benazepril (LOTENSIN) 40 MG tablet TAKE 1 TABLET BY MOUTH DAILY   betamethasone dipropionate 0.05 % cream Apply 1 application topically 2 (two) times daily.   budesonide-formoterol (SYMBICORT) 80-4.5 MCG/ACT inhaler Inhale 2 puffs into the lungs in the morning and at bedtime.   busPIRone (BUSPAR) 10 MG tablet Take 10 mg by mouth 3 (three) times daily.   calcium citrate-vitamin D (CITRACAL+D) 315-200 MG-UNIT tablet Take 2 tablets by mouth daily.   Cholecalciferol (VITAMIN D3) 1000 UNITS CAPS Take 1 capsule by mouth daily.   clobetasol cream (TEMOVATE) 0.05 % Apply 1 application topically See admin instructions. Apply to affected areas of body 1 - 2 times daily as needed for itchy bumps. Avoid applying to face, groin, and axilla. Use as directed.   cyanocobalamin (VITAMIN B12) 1000 MCG/ML injection INJECT 1 ML INTO THE MUSCLE EVERY 3 MONTHS.   Diclofenac Sodium 3 % GEL Apply topically 2 (two) times daily.   Docusate Sodium (DSS) 100 MG CAPS Take 100 mg by mouth 2 (two) times daily.   DULoxetine (CYMBALTA) 60 MG capsule Take 60 mg by mouth daily.   Fluocinolone Acetonide 0.01 % OIL Apply 1-2 drops into ears once to twice daily as needed.   fluocinonide (LIDEX) 0.05 %  external solution Apply topically 2 (two) times daily. Apply drops to scalp for itch   fluticasone (FLONASE) 50 MCG/ACT nasal spray Place 2 sprays into both nostrils daily.   furosemide (LASIX) 20 MG tablet TAKE 1 TABLET BY MOUTH DAILY AS NEEDED   gabapentin (NEURONTIN) 300 MG capsule TAKE 2 CAPSULES BY MOUTH 3 TIMES A DAY (IN THE MORNING, MIDDAY AND AT BEDTIME).   HYDROcodone-acetaminophen (NORCO) 10-325 MG tablet Take 1 tablet by mouth 3 (three) times daily.   Infant Care Products [facility]) OINT Apply topically as directed (as discussed).   ipratropium (ATROVENT) 0.03 % nasal spray Place 2 sprays into both nostrils every 12 (twelve) hours.   ketoconazole (NIZORAL) 2 % shampoo Apply 1 Application topically See admin instructions. apply three times per week, massage into scalp and leave in for 10 minutes before rinsing out   levalbuterol (XOPENEX) 0.63 MG/3ML nebulizer solution Take 3 mLs (0.63 mg total) by nebulization every 6 (six) hours as needed for wheezing or shortness of breath.   LINZESS 145 MCG CAPS capsule Take 145 mcg by mouth daily.   magnesium oxide (MAG-OX) 400 MG tablet Take 1 tablet (400 mg total) by mouth daily.   metoprolol tartrate (LOPRESSOR) 25 MG tablet Take 25 mg by mouth daily.   morphine (MS CONTIN) 30 MG 12 hr tablet Take 30 mg by mouth every 12 (twelve) hours.   morphine (MS CONTIN) 60 MG 12 hr tablet Take 1 tablet (60 mg total) by mouth 2 (two) times daily.   mupirocin ointment (BACTROBAN) 2 % Apply 1 Application topically daily. Qd to excision site   nystatin (MYCOSTATIN/NYSTOP) powder Apply 1 application. topically 2 (two) times daily as needed.   nystatin cream (MYCOSTATIN) Apply 1 Application topically 2 (two) times daily.   pantoprazole (PROTONIX) 40 MG tablet Take 1 tablet (40 mg total) by mouth 2 (two) times daily.   polyethylene glycol powder (GLYCOLAX/MIRALAX) 17 GM/SCOOP powder MIX 17 GRAMS AS MARKED ON BOTTLE TOP IN 8 OUNCES OF WATER AND DRINK ONCE A DAY AS  DIRECTED.  Simethicone (GAS-X PO) Take 2 tablets by mouth 2 (two) times daily.   sodium chloride (OCEAN) 0.65 % nasal spray Place 1 spray into the nose as needed.   SYNTHROID 100 MCG tablet TAKE 1 TABLET BY MOUTH DAILY   triamcinolone ointment (KENALOG) 0.1 % APPLY TWICE DAILY TO BITES AND RASH UNTIL FLAT AND SMOOTH **DO NOT APPLY TO FACE**   No facility-administered encounter medications on file as of 09/19/2023.     Lab Results  Component Value Date   WBC 4.9 08/22/2023   HGB 10.6 (L) 08/22/2023   HCT 32.8 (L) 08/22/2023   PLT 147 (L) 08/22/2023   GLUCOSE 87 08/22/2023   CHOL 125 05/19/2023   TRIG 67.0 05/19/2023   HDL 54.00 05/19/2023   LDLCALC 58 05/19/2023   ALT 13 07/29/2023   AST 22 07/29/2023   NA 138 08/22/2023   K 3.9 08/22/2023   CL 98 08/22/2023   CREATININE 0.79 08/22/2023   BUN 18 08/22/2023   CO2 29 08/22/2023   TSH 2.51 07/22/2023   INR 0.89 08/06/2018   HGBA1C 6.0 10/19/2014    CT ANGIO HEAD NECK W WO CM (CODE STROKE) Result Date: 07/29/2023 CLINICAL DATA:  Initial evaluation for acute TIA. EXAM: CT ANGIOGRAPHY HEAD AND NECK WITH AND WITHOUT CONTRAST TECHNIQUE: Multidetector CT imaging of the head and neck was performed using the standard protocol during bolus administration of intravenous contrast. Multiplanar CT image reconstructions and MIPs were obtained to evaluate the vascular anatomy. Carotid stenosis measurements (when applicable) are obtained utilizing NASCET criteria, using the distal internal carotid diameter as the denominator. RADIATION DOSE REDUCTION: This exam was performed according to the departmental dose-optimization program which includes automated exposure control, adjustment of the mA and/or kV according to patient size and/or use of iterative reconstruction technique. CONTRAST:  75mL OMNIPAQUE IOHEXOL 350 MG/ML SOLN COMPARISON:  CT from earlier the same day. FINDINGS: CTA NECK FINDINGS Aortic arch: Visualized aortic arch within normal limits  for caliber with standard branch pattern. Aortic atherosclerosis. No stenosis about the origin the great vessels. Right carotid system: Right common and internal carotid arteries are patent without dissection. Atheromatous change about the right carotid bulb with estimated 40% stenosis by NASCET criteria. Left carotid system: Left common and internal carotid arteries are patent without dissection. Calcified plaque about the left carotid bulb without hemodynamically significant greater than 50% stenosis. Vertebral arteries: Both vertebral arteries arise from subclavian arteries. No dissection. Moderate stenosis involving the distal right V2 segment at the level of the right C3 transverse foramen due to extrinsic compression from uncovertebral disease (series 8, image 186). No other stenosis. Skeleton: No discrete or worrisome osseous lesions. Moderate to advanced cervical spondylosis, most pronounced at C3-4 and C4-5. Other neck: No other acute finding. Upper chest: 6 mm right upper lobe pulmonary nodule (series 6, image 162). Additional 4 mm left upper lobe pulmonary nodule (series 6, image 176). No other acute finding. Review of the MIP images confirms the above findings CTA HEAD FINDINGS Anterior circulation: Mild atheromatous change about the carotid siphons without stenosis. A1 segments, anterior communicating artery complex common anterior cerebral arteries widely patent. No M1 stenosis or occlusion. Distal MCA branches perfused and symmetric. Posterior circulation: Both V4 segments patent without significant stenosis. Right PICA patent. Left PICA not well seen. Basilar patent without stenosis. Superior cerebellar and posterior cerebral arteries patent bilaterally. Venous sinuses: Patent allowing for timing the contrast bolus. Anatomic variants: As above.  No aneurysm. Review of the MIP images confirms the  above findings IMPRESSION: 1. Negative CTA for large vessel occlusion or other emergent finding. 2. Mild  atheromatous change about the carotid bifurcations and carotid siphons without hemodynamically significant or correctable stenosis. 3. Moderate stenosis involving the distal right V2 segment at the level of the right C3 transverse foramen due to extrinsic compression from uncovertebral disease. 4.  Aortic Atherosclerosis (ICD10-I70.0). 5. Few bilateral pulmonary nodules, largest of which measures 6 mm at the right upper lobe. Per Fleischner Society Guidelines, recommend a non-contrast Chest CT at 3-6 months, then consider another non-contrast Chest CT at 18-24 months. If patient is low risk for malignancy, non-contrast Chest CT at 18-24 months is optional. These guidelines do not apply to immunocompromised patients and patients with cancer. Follow up in patients with significant comorbidities as clinically warranted. For lung cancer screening, adhere to Lung-RADS guidelines. Reference: Radiology. 2017; 284(1):228-43. Electronically Signed   By: Rise Mu M.D.   On: 07/29/2023 20:57   CT Head Wo Contrast Result Date: 07/29/2023 CLINICAL DATA:  Altered mental status and disorientation. Worsening headache. EXAM: CT HEAD WITHOUT CONTRAST TECHNIQUE: Contiguous axial images were obtained from the base of the skull through the vertex without intravenous contrast. RADIATION DOSE REDUCTION: This exam was performed according to the departmental dose-optimization program which includes automated exposure control, adjustment of the mA and/or kV according to patient size and/or use of iterative reconstruction technique. COMPARISON:  Head CT dated 02/22/2023. FINDINGS: Brain: Mild age-related atrophy and chronic microvascular ischemic changes. There is no acute intracranial hemorrhage. No mass effect or midline shift. No extra-axial fluid collection. Vascular: No hyperdense vessel or unexpected calcification. Skull: Normal. Negative for fracture or focal lesion. Sinuses/Orbits: There is partial opacification of the  left sphenoid sinus. The remainder of the visualized paranasal sinuses and mastoid air cells are clear. Other: None IMPRESSION: 1. No acute intracranial pathology. 2. Mild age-related atrophy and chronic microvascular ischemic changes. Electronically Signed   By: Elgie Collard M.D.   On: 07/29/2023 18:33       Assessment & Plan:  There are no diagnoses linked to this encounter.   Dale Parker, MD

## 2023-09-22 ENCOUNTER — Ambulatory Visit: Payer: Medicare HMO | Admitting: Neurology

## 2023-09-23 ENCOUNTER — Telehealth: Payer: Self-pay

## 2023-09-23 NOTE — Telephone Encounter (Signed)
Copied from CRM [id number]. Topic: Clinical - Medical Advice >> Sep 23, 2023  3:16 PM Isabell A wrote: Reason for CRM: Patient is requesting to speak with Bethann Berkshire in regard to changing her oxygen company from one to another.

## 2023-09-24 NOTE — Telephone Encounter (Signed)
Oxygen is prescribed by pulmonology. Pt is going to contact them

## 2023-09-29 DIAGNOSIS — M47812 Spondylosis without myelopathy or radiculopathy, cervical region: Secondary | ICD-10-CM | POA: Diagnosis not present

## 2023-10-01 ENCOUNTER — Other Ambulatory Visit: Payer: Self-pay | Admitting: Internal Medicine

## 2023-10-06 ENCOUNTER — Telehealth: Payer: Self-pay | Admitting: Student in an Organized Health Care Education/Training Program

## 2023-10-06 NOTE — Telephone Encounter (Signed)
Patient is calling to get oxygen switched. She has [employer] but would like [employer] to be her supplier. Please call and advise [phone]. Patietn now has [employer].

## 2023-10-06 NOTE — Telephone Encounter (Signed)
Okay to place order for O2 to [employer]?

## 2023-10-08 DIAGNOSIS — I10 Essential (primary) hypertension: Secondary | ICD-10-CM | POA: Diagnosis not present

## 2023-10-08 DIAGNOSIS — I5032 Chronic diastolic (congestive) heart failure: Secondary | ICD-10-CM | POA: Diagnosis not present

## 2023-10-08 DIAGNOSIS — I340 Nonrheumatic mitral (valve) insufficiency: Secondary | ICD-10-CM | POA: Diagnosis not present

## 2023-10-08 DIAGNOSIS — I350 Nonrheumatic aortic (valve) stenosis: Secondary | ICD-10-CM | POA: Diagnosis not present

## 2023-10-08 NOTE — Telephone Encounter (Signed)
 I spoke with the patient. I told her we did have not placed and order for O2 for her. So we will have to bring her in the office and do a walk test to re-qualify her in order to send over an order to [employer].   She said she thinks her PCP ordered the O2 for her before and she will reach out to their office.  Nothing further needed.

## 2023-10-09 ENCOUNTER — Encounter: Payer: Self-pay | Admitting: Emergency Medicine

## 2023-10-09 ENCOUNTER — Other Ambulatory Visit: Payer: Self-pay

## 2023-10-09 ENCOUNTER — Inpatient Hospital Stay
Admission: EM | Admit: 2023-10-09 | Discharge: 2023-10-12 | DRG: 308 | Disposition: A | Discharge status: Home / Self Care | Payer: Medicare Other | Source: Ambulatory Visit | Attending: Internal Medicine | Admitting: Internal Medicine

## 2023-10-09 ENCOUNTER — Encounter: Payer: Self-pay | Admitting: Internal Medicine

## 2023-10-09 ENCOUNTER — Emergency Department: Payer: Medicare Other

## 2023-10-09 ENCOUNTER — Ambulatory Visit: Payer: Medicare Other | Admitting: Internal Medicine

## 2023-10-09 VITALS — BP 150/70 | HR 135 | Ht 62.0 in | Wt 174.0 lb

## 2023-10-09 DIAGNOSIS — E7800 Pure hypercholesterolemia, unspecified: Secondary | ICD-10-CM | POA: Diagnosis not present

## 2023-10-09 DIAGNOSIS — Z882 Allergy status to sulfonamides status: Secondary | ICD-10-CM

## 2023-10-09 DIAGNOSIS — I110 Hypertensive heart disease with heart failure: Principal | ICD-10-CM | POA: Diagnosis present

## 2023-10-09 DIAGNOSIS — G4733 Obstructive sleep apnea (adult) (pediatric): Secondary | ICD-10-CM | POA: Diagnosis not present

## 2023-10-09 DIAGNOSIS — M545 Low back pain, unspecified: Secondary | ICD-10-CM | POA: Diagnosis not present

## 2023-10-09 DIAGNOSIS — E66811 Obesity, class 1: Secondary | ICD-10-CM | POA: Diagnosis not present

## 2023-10-09 DIAGNOSIS — Z79890 Hormone replacement therapy (postmenopausal): Secondary | ICD-10-CM

## 2023-10-09 DIAGNOSIS — G8929 Other chronic pain: Secondary | ICD-10-CM | POA: Diagnosis not present

## 2023-10-09 DIAGNOSIS — K219 Gastro-esophageal reflux disease without esophagitis: Secondary | ICD-10-CM | POA: Diagnosis not present

## 2023-10-09 DIAGNOSIS — M25511 Pain in right shoulder: Secondary | ICD-10-CM

## 2023-10-09 DIAGNOSIS — F439 Reaction to severe stress, unspecified: Secondary | ICD-10-CM | POA: Diagnosis not present

## 2023-10-09 DIAGNOSIS — I4891 Unspecified atrial fibrillation: Secondary | ICD-10-CM | POA: Diagnosis not present

## 2023-10-09 DIAGNOSIS — Z7951 Long term (current) use of inhaled steroids: Secondary | ICD-10-CM | POA: Diagnosis not present

## 2023-10-09 DIAGNOSIS — E6609 Other obesity due to excess calories: Secondary | ICD-10-CM

## 2023-10-09 DIAGNOSIS — I10 Essential (primary) hypertension: Secondary | ICD-10-CM | POA: Diagnosis not present

## 2023-10-09 DIAGNOSIS — Z7901 Long term (current) use of anticoagulants: Secondary | ICD-10-CM | POA: Diagnosis not present

## 2023-10-09 DIAGNOSIS — I700 Atherosclerosis of aorta: Secondary | ICD-10-CM | POA: Diagnosis not present

## 2023-10-09 DIAGNOSIS — Z604 Social exclusion and rejection: Secondary | ICD-10-CM | POA: Diagnosis present

## 2023-10-09 DIAGNOSIS — I5033 Acute on chronic diastolic (congestive) heart failure: Secondary | ICD-10-CM | POA: Diagnosis not present

## 2023-10-09 DIAGNOSIS — I471 Supraventricular tachycardia, unspecified: Secondary | ICD-10-CM

## 2023-10-09 DIAGNOSIS — R000 Tachycardia, unspecified: Secondary | ICD-10-CM | POA: Diagnosis not present

## 2023-10-09 DIAGNOSIS — I482 Chronic atrial fibrillation, unspecified: Secondary | ICD-10-CM | POA: Diagnosis not present

## 2023-10-09 DIAGNOSIS — Z7982 Long term (current) use of aspirin: Secondary | ICD-10-CM

## 2023-10-09 DIAGNOSIS — M549 Dorsalgia, unspecified: Secondary | ICD-10-CM | POA: Diagnosis not present

## 2023-10-09 DIAGNOSIS — Z6831 Body mass index (BMI) 31.0-31.9, adult: Secondary | ICD-10-CM

## 2023-10-09 DIAGNOSIS — K760 Fatty (change of) liver, not elsewhere classified: Secondary | ICD-10-CM | POA: Diagnosis present

## 2023-10-09 DIAGNOSIS — Z91048 Other nonmedicinal substance allergy status: Secondary | ICD-10-CM

## 2023-10-09 DIAGNOSIS — F419 Anxiety disorder, unspecified: Secondary | ICD-10-CM | POA: Diagnosis present

## 2023-10-09 DIAGNOSIS — R0602 Shortness of breath: Secondary | ICD-10-CM | POA: Diagnosis not present

## 2023-10-09 DIAGNOSIS — Z79899 Other long term (current) drug therapy: Secondary | ICD-10-CM | POA: Diagnosis not present

## 2023-10-09 DIAGNOSIS — I272 Pulmonary hypertension, unspecified: Secondary | ICD-10-CM | POA: Diagnosis present

## 2023-10-09 DIAGNOSIS — Z79891 Long term (current) use of opiate analgesic: Secondary | ICD-10-CM

## 2023-10-09 DIAGNOSIS — E785 Hyperlipidemia, unspecified: Secondary | ICD-10-CM | POA: Diagnosis present

## 2023-10-09 DIAGNOSIS — R001 Bradycardia, unspecified: Secondary | ICD-10-CM

## 2023-10-09 DIAGNOSIS — R918 Other nonspecific abnormal finding of lung field: Secondary | ICD-10-CM

## 2023-10-09 DIAGNOSIS — I890 Lymphedema, not elsewhere classified: Secondary | ICD-10-CM | POA: Diagnosis not present

## 2023-10-09 DIAGNOSIS — Z8249 Family history of ischemic heart disease and other diseases of the circulatory system: Secondary | ICD-10-CM | POA: Diagnosis not present

## 2023-10-09 DIAGNOSIS — M25512 Pain in left shoulder: Secondary | ICD-10-CM

## 2023-10-09 DIAGNOSIS — E039 Hypothyroidism, unspecified: Secondary | ICD-10-CM | POA: Diagnosis present

## 2023-10-09 DIAGNOSIS — R0902 Hypoxemia: Secondary | ICD-10-CM | POA: Diagnosis not present

## 2023-10-09 DIAGNOSIS — N3010 Interstitial cystitis (chronic) without hematuria: Secondary | ICD-10-CM | POA: Diagnosis present

## 2023-10-09 DIAGNOSIS — J9811 Atelectasis: Secondary | ICD-10-CM | POA: Diagnosis not present

## 2023-10-09 DIAGNOSIS — R079 Chest pain, unspecified: Secondary | ICD-10-CM | POA: Diagnosis not present

## 2023-10-09 DIAGNOSIS — Z96653 Presence of artificial knee joint, bilateral: Secondary | ICD-10-CM | POA: Diagnosis present

## 2023-10-09 DIAGNOSIS — Z886 Allergy status to analgesic agent status: Secondary | ICD-10-CM

## 2023-10-09 DIAGNOSIS — F32A Depression, unspecified: Secondary | ICD-10-CM | POA: Diagnosis present

## 2023-10-09 DIAGNOSIS — Z881 Allergy status to other antibiotic agents status: Secondary | ICD-10-CM

## 2023-10-09 DIAGNOSIS — J9611 Chronic respiratory failure with hypoxia: Secondary | ICD-10-CM

## 2023-10-09 DIAGNOSIS — Z888 Allergy status to other drugs, medicaments and biological substances status: Secondary | ICD-10-CM

## 2023-10-09 DIAGNOSIS — Z823 Family history of stroke: Secondary | ICD-10-CM

## 2023-10-09 DIAGNOSIS — I509 Heart failure, unspecified: Secondary | ICD-10-CM

## 2023-10-09 LAB — COMPREHENSIVE METABOLIC PANEL
ALT: 17 U/L (ref 0–44)
AST: 22 U/L (ref 15–41)
Albumin: 3.8 g/dL (ref 3.5–5.0)
Alkaline Phosphatase: 83 U/L (ref 38–126)
Anion gap: 9 (ref 5–15)
BUN: 28 mg/dL — ABNORMAL HIGH (ref 8–23)
CO2: 26 mmol/L (ref 22–32)
Calcium: 9.4 mg/dL (ref 8.9–10.3)
Chloride: 103 mmol/L (ref 98–111)
Creatinine, Ser: 0.98 mg/dL (ref 0.44–1.00)
GFR, Estimated: 55 mL/min — ABNORMAL LOW (ref >60)
Glucose, Bld: 128 mg/dL — ABNORMAL HIGH (ref 70–99)
Potassium: 4.2 mmol/L (ref 3.5–5.1)
Sodium: 138 mmol/L (ref 135–145)
Total Bilirubin: 0.7 mg/dL (ref 0.0–1.2)
Total Protein: 6.9 g/dL (ref 6.5–8.1)

## 2023-10-09 LAB — CBC WITH DIFFERENTIAL/PLATELET
Abs Immature Granulocytes: 0.08 10*3/uL — ABNORMAL HIGH (ref 0.00–0.07)
Basophils Absolute: 0 10*3/uL (ref 0.0–0.1)
Basophils Relative: 0 %
Eosinophils Absolute: 0 10*3/uL (ref 0.0–0.5)
Eosinophils Relative: 0 %
HCT: 34.1 % — ABNORMAL LOW (ref 36.0–46.0)
Hemoglobin: 11.1 g/dL — ABNORMAL LOW (ref 12.0–15.0)
Immature Granulocytes: 1 %
Lymphocytes Relative: 9 %
Lymphs Abs: 0.9 10*3/uL (ref 0.7–4.0)
MCH: 33.5 pg (ref 26.0–34.0)
MCHC: 32.6 g/dL (ref 30.0–36.0)
MCV: 103 fL — ABNORMAL HIGH (ref 80.0–100.0)
Monocytes Absolute: 0.3 10*3/uL (ref 0.1–1.0)
Monocytes Relative: 3 %
Neutro Abs: 8.5 10*3/uL — ABNORMAL HIGH (ref 1.7–7.7)
Neutrophils Relative %: 87 %
Platelets: 231 10*3/uL (ref 150–400)
RBC: 3.31 MIL/uL — ABNORMAL LOW (ref 3.87–5.11)
RDW: 13.7 % (ref 11.5–15.5)
WBC: 9.8 10*3/uL (ref 4.0–10.5)
nRBC: 0 % (ref 0.0–0.2)

## 2023-10-09 LAB — D-DIMER, QUANTITATIVE: D-Dimer, Quant: 0.78 ug{FEU}/mL — ABNORMAL HIGH (ref 0.00–0.50)

## 2023-10-09 LAB — T4, FREE: Free T4: 0.89 ng/dL (ref 0.61–1.12)

## 2023-10-09 LAB — MAGNESIUM: Magnesium: 2 mg/dL (ref 1.7–2.4)

## 2023-10-09 LAB — TROPONIN I (HIGH SENSITIVITY)
Troponin I (High Sensitivity): 10 ng/L (ref <18)
Troponin I (High Sensitivity): 11 ng/L (ref <18)

## 2023-10-09 LAB — BRAIN NATRIURETIC PEPTIDE: B Natriuretic Peptide: 568.6 pg/mL — ABNORMAL HIGH (ref 0.0–100.0)

## 2023-10-09 LAB — TSH: TSH: 0.468 u[IU]/mL (ref 0.350–4.500)

## 2023-10-09 MED ORDER — GABAPENTIN 300 MG PO CAPS
300.0000 mg | ORAL_CAPSULE | Freq: Three times a day (TID) | ORAL | Status: DC
  Administered 2023-10-09 – 2023-10-12 (×8): 300 mg via ORAL
  Filled 2023-10-09 (×8): qty 1

## 2023-10-09 MED ORDER — ALUM & MAG HYDROXIDE-SIMETH 200-200-20 MG/5ML PO SUSP
15.0000 mL | Freq: Three times a day (TID) | ORAL | Status: DC | PRN
  Administered 2023-10-11: 15 mL via ORAL
  Filled 2023-10-09: qty 30

## 2023-10-09 MED ORDER — FUROSEMIDE 10 MG/ML IJ SOLN
40.0000 mg | Freq: Once | INTRAMUSCULAR | Status: AC
  Administered 2023-10-09: 40 mg via INTRAVENOUS
  Filled 2023-10-09: qty 4

## 2023-10-09 MED ORDER — DILTIAZEM HCL-DEXTROSE 125-5 MG/125ML-% IV SOLN (PREMIX)
5.0000 mg/h | INTRAVENOUS | Status: DC
  Administered 2023-10-09: 5 mg/h via INTRAVENOUS
  Filled 2023-10-09: qty 125

## 2023-10-09 MED ORDER — MORPHINE SULFATE ER 15 MG PO TBCR
30.0000 mg | EXTENDED_RELEASE_TABLET | Freq: Once | ORAL | Status: AC
  Administered 2023-10-10: 30 mg via ORAL
  Filled 2023-10-09: qty 2

## 2023-10-09 MED ORDER — DILTIAZEM HCL 25 MG/5ML IV SOLN
10.0000 mg | Freq: Once | INTRAVENOUS | Status: AC
  Administered 2023-10-09: 10 mg via INTRAVENOUS
  Filled 2023-10-09: qty 5

## 2023-10-09 MED ORDER — APIXABAN 2.5 MG PO TABS
2.5000 mg | ORAL_TABLET | Freq: Two times a day (BID) | ORAL | Status: DC
  Administered 2023-10-09 – 2023-10-12 (×6): 2.5 mg via ORAL
  Filled 2023-10-09 (×7): qty 1

## 2023-10-09 MED ORDER — AMIODARONE HCL IN DEXTROSE 360-4.14 MG/200ML-% IV SOLN
30.0000 mg/h | INTRAVENOUS | Status: DC
  Administered 2023-10-10 (×2): 30 mg/h via INTRAVENOUS
  Filled 2023-10-09: qty 200

## 2023-10-09 MED ORDER — AMIODARONE HCL IN DEXTROSE 360-4.14 MG/200ML-% IV SOLN
60.0000 mg/h | INTRAVENOUS | Status: AC
  Administered 2023-10-09 (×2): 60 mg/h via INTRAVENOUS
  Filled 2023-10-09 (×2): qty 200

## 2023-10-09 MED ORDER — PREDNISONE 20 MG PO TABS: 50.0000 mg | ORAL_TABLET | Freq: Every day | ORAL | Status: DC

## 2023-10-09 MED ORDER — SODIUM CHLORIDE 0.9 % IV BOLUS
500.0000 mL | Freq: Once | INTRAVENOUS | Status: AC
  Administered 2023-10-09: 500 mL via INTRAVENOUS

## 2023-10-09 MED ORDER — MORPHINE SULFATE ER 15 MG PO TBCR: 30.0000 mg | EXTENDED_RELEASE_TABLET | Freq: Two times a day (BID) | ORAL | Status: DC

## 2023-10-09 MED ORDER — BENAZEPRIL HCL 40 MG PO TABS: 40.0000 mg | ORAL_TABLET | Freq: Every day | ORAL | 1 refills | Status: DC

## 2023-10-09 MED ORDER — AMLODIPINE BESYLATE 10 MG PO TABS: 10.0000 mg | ORAL_TABLET | Freq: Every day | ORAL | 1 refills | Status: DC

## 2023-10-09 MED ORDER — FUROSEMIDE 20 MG PO TABS
20.0000 mg | ORAL_TABLET | Freq: Every day | ORAL | Status: DC
  Administered 2023-10-10 – 2023-10-12 (×3): 20 mg via ORAL
  Filled 2023-10-09 (×3): qty 1

## 2023-10-09 MED ORDER — AMIODARONE LOAD VIA INFUSION
150.0000 mg | Freq: Once | INTRAVENOUS | Status: AC
  Administered 2023-10-09: 150 mg via INTRAVENOUS
  Filled 2023-10-09: qty 83.34

## 2023-10-09 MED ORDER — METOPROLOL TARTRATE 25 MG PO TABS
25.0000 mg | ORAL_TABLET | Freq: Two times a day (BID) | ORAL | Status: DC
  Administered 2023-10-09 – 2023-10-12 (×6): 25 mg via ORAL
  Filled 2023-10-09 (×6): qty 1

## 2023-10-09 NOTE — ED Notes (Signed)
 Family left bedside at this time.

## 2023-10-09 NOTE — Assessment & Plan Note (Signed)
 Followed by psychiatry.  Some increased stress recently. Dr Bradford Cadet increased cymbalta  - adding 20mg  q pm.

## 2023-10-09 NOTE — Assessment & Plan Note (Signed)
 Lung nodules noted on recent CT. Discussed need for f/u with pulmonary for further f/u and evaluation

## 2023-10-09 NOTE — Assessment & Plan Note (Signed)
 Noted to be tachycardic on exam. EKG - question of afib. Ventricular rate on exam 135-150. Has a documented history of SVT. Took her metoprolol  this am. Has been having issues with bradycardia - previously. Evaluated by cardiology 08/13/23 - 40% SVE burden and non-sustained paroxysmal SVT. Bradycardic with average HR 61 bpm, range 52-89 bpm when in sinus rhythm. 46% of the time in bradycardia. 54% controlled rate. SVT fastest at 132 bpm with longest at 9 beats. Elected to defer pacemaker placement.  Given sustained increased heart rate and above history and noticed of increased sob with exertion, discussed need for further treatment and evaluation in ER. EMS notified. Daughter and caretaker notified.

## 2023-10-09 NOTE — Assessment & Plan Note (Signed)
 Evaluated by cardiology 08/13/23 - 40% SVE burden and non-sustained paroxysmal SVT. Bradycardic with average HR 61 bpm, range 52-89 bpm when in sinus rhythm. 46% of the time in bradycardia. 54% controlled rate. SVT fastest at 132 bpm with longest at 9 beats. Elected to defer pacemaker placement.

## 2023-10-09 NOTE — Progress Notes (Signed)
 Subjective:    Patient ID: Kimberly Roy, female    DOB: [birth year], 89 y.o.   MRN: [id number]  Patient here for  Chief Complaint  Patient presents with   Medical Management of Chronic Issues    HPI Here for a scheduled follow up. Was seen in ER 07/2023 - 15 minute episode of disorientation noted by caregiver and family member. CTA Head and Neck without acute finding. Mild atheromatous changes in carotid bifurcations without significant stenosis. Aortic atherosclerosis noted. Incidental finding of bilateral pulmonary nodules noted, largest 6 mm. Evaluated by cardiology 08/13/23 - 40% SVE burden and non-sustained paroxysmal SVT. Bradycardic with average HR 61 bpm, range 52-89 bpm when in sinus rhythm. 46% of the time in bradycardia. 54% controlled rate. SVT fastest at 132 bpm with longest at 9 beats. Elected to defer pacemaker placement - asymptomatic, low activity level. Last 2D echocardiogram 07/14/2022 showed normal ventricular function with LVEF 60 to 65% with mild mitral regurgitation, mild to moderate tricuspid stenosis, and mild aortic stenosis. No significant change in ectopy burden since 12/2022 Holter. Repeat ECHO to evaluate for arrhythmogenic LV dysfunction if pt becomes symptomatic. Had f/u with ortho 09/09/23 - f/u low back pain. Placed on steroid dosepak and instructed to f/u with her pain specialist.  On questioning today, she reports noticing being more sob with ambulation. Caretaker reports she has been using her oxygen  more with ambulation - lately. No chest pain.  No increased cough.  Eating. No vomiting. No further episodes of disorientation.    Past Medical History:  Diagnosis Date   Anemia    Anxiety    Chest pain    CHF (congestive heart failure) (HCC)    Constipation    DDD (degenerative disc disease), cervical    Depression    DVT (deep venous thrombosis) (HCC)    Dysphonia    Dyspnea    Fatty liver    Fatty liver    Headache    Hyperlipidemia    Hyperpiesia     Hypertension    Hypothyroidism    Interstitial cystitis    Left ventricular dysfunction    Lymphedema    Nephrolithiasis    Obstructive sleep apnea    Osteoarthritis    knees/cervical and lumbar spine   Pulmonary hypertension (HCC)    Pulmonary nodules    followed by Dr Theotis   Pure hypercholesterolemia    Renal cyst    right   Past Surgical History:  Procedure Laterality Date   ABDOMINAL HYSTERECTOMY     ovaries left in place   APPENDECTOMY     Back Surgeries     BACK SURGERY     BREAST REDUCTION SURGERY     3/99   CARDIAC CATHETERIZATION     cataracts Bilateral    CERVICAL SPINE SURGERY     ESOPHAGEAL MANOMETRY N/A 08/02/2015   Procedure: ESOPHAGEAL MANOMETRY (EM);  Surgeon: Donnice Vaughn Manes, MD;  Location: [facility] ENDOSCOPY;  Service: Endoscopy;  Laterality: N/A;   ESOPHAGOGASTRODUODENOSCOPY N/A 02/27/2015   Procedure: ESOPHAGOGASTRODUODENOSCOPY (EGD);  Surgeon: Deward CINDERELLA Piedmont, MD;  Location: [facility] ENDOSCOPY;  Service: Gastroenterology;  Laterality: N/A;   ESOPHAGOGASTRODUODENOSCOPY (EGD) WITH PROPOFOL  N/A 10/30/2018   Procedure: ESOPHAGOGASTRODUODENOSCOPY (EGD) WITH PROPOFOL ;  Surgeon: Gaylyn Gladis PENNER, MD;  Location: [facility] ENDOSCOPY;  Service: Endoscopy;  Laterality: N/A;   EXCISIONAL HEMORRHOIDECTOMY     EYE SURGERY     FRACTURE SURGERY     HEMORRHOID SURGERY     HIP SURGERY  2013  Right hip surgery   JOINT REPLACEMENT     KNEE ARTHROSCOPY     left and right   ORIF FEMUR FRACTURE Right 08/07/2018   Procedure: OPEN REDUCTION INTERNAL FIXATION (ORIF) DISTAL FEMUR FRACTURE;  Surgeon: Kathlynn Sharper, MD;  Location: [facility] ORS;  Service: Orthopedics;  Laterality: Right;   REDUCTION MAMMAPLASTY Bilateral YRS AGO   REPLACEMENT TOTAL KNEE Bilateral    rotator cuff surgery     blilateral   TONSILECTOMY/ADENOIDECTOMY WITH MYRINGOTOMY     VISCERAL ARTERY INTERVENTION N/A 02/25/2019   Procedure: VISCERAL ARTERY INTERVENTION;  Surgeon: Marea Selinda RAMAN, MD;  Location: [facility] INVASIVE CV  LAB;  Service: Cardiovascular;  Laterality: N/A;   Family History  Problem Relation Age of Onset   Heart disease Mother    Stroke Mother    Hypertension Mother    Heart disease Father        myocardial infarction age 64   Breast cancer Neg Hx    Social History   Socioeconomic History   Marital status: Widowed    Spouse name: Not on file   Number of children: 2   Years of education: 62   Highest education level: Associate degree: occupational, scientist, product/process development, or vocational program  Occupational History    Comment: engineer, site  Tobacco Use   Smoking status: Never   Smokeless tobacco: Never  Vaping Use   Vaping status: Never Used  Substance and Sexual Activity   Alcohol use: No    Alcohol/week: 0.0 standard drinks of alcohol   Drug use: Never   Sexual activity: Not Currently  Other Topics Concern   Not on file  Social History Narrative   Not on file   Social Drivers of Health   Financial Resource Strain: Low Risk  (09/16/2023)   Overall Financial Resource Strain (CARDIA)    Difficulty of Paying Living Expenses: Not very hard  Food Insecurity: No Food Insecurity (09/16/2023)   Hunger Vital Sign    Worried About Running Out of Food in the Last Year: Never true    Ran Out of Food in the Last Year: Never true  Transportation Needs: No Transportation Needs (09/16/2023)   PRAPARE - Administrator, Civil Service (Medical): No    Lack of Transportation (Non-Medical): No  Physical Activity: Inactive (09/16/2023)   Exercise Vital Sign    Days of Exercise per Week: 0 days    Minutes of Exercise per Session: 10 min  Stress: No Stress Concern Present (09/16/2023)   [employer] of Occupational Health - Occupational Stress Questionnaire    Feeling of Stress : Not at all  Social Connections: Socially Isolated (09/16/2023)   Social Connection and Isolation Panel [NHANES]    Frequency of Communication with Friends and Family: More than three times a week    Frequency  of Social Gatherings with Friends and Family: More than three times a week    Attends Religious Services: Never    Database Administrator or Organizations: No    Attends Banker Meetings: Never    Marital Status: Widowed     Review of Systems  Constitutional:  Positive for fatigue. Negative for appetite change and unexpected weight change.  HENT:  Negative for congestion and sinus pressure.   Respiratory:  Positive for shortness of breath. Negative for cough and chest tightness.   Cardiovascular:  Negative for chest pain and palpitations.  Gastrointestinal:  Negative for abdominal pain, diarrhea, nausea and vomiting.  Genitourinary:  Negative for difficulty  urinating and dysuria.  Musculoskeletal:  Negative for myalgias.       Some left shoulder pain - overall improved.   Skin:  Negative for color change and rash.  Neurological:  Negative for dizziness and headaches.  Psychiatric/Behavioral:  Negative for agitation.        Increased stress.         Objective:     BP (!) 150/70   Pulse (!) 135   Ht 5' 2 (1.575 m)   Wt 174 lb (78.9 kg)   SpO2 92%   BMI 31.83 kg/m  Wt Readings from Last 3 Encounters:  10/09/23 174 lb (78.9 kg)  08/22/23 180 lb (81.6 kg)  07/29/23 177 lb (80.3 kg)    Physical Exam Vitals reviewed.  Constitutional:      General: She is not in acute distress.    Appearance: Normal appearance.  HENT:     Head: Normocephalic and atraumatic.     Right Ear: External ear normal.     Left Ear: External ear normal.     Mouth/Throat:     Pharynx: No oropharyngeal exudate or posterior oropharyngeal erythema.  Eyes:     General: No scleral icterus.       Right eye: No discharge.        Left eye: No discharge.     Conjunctiva/sclera: Conjunctivae normal.  Neck:     Thyroid : No thyromegaly.  Cardiovascular:     Comments: Tachycardic - ventricular rate on exam 134-150.  Pulmonary:     Effort: No respiratory distress.     Breath sounds: Normal  breath sounds. No wheezing.  Abdominal:     General: Bowel sounds are normal.     Palpations: Abdomen is soft.     Tenderness: There is no abdominal tenderness.  Musculoskeletal:        General: No swelling or tenderness.     Cervical back: Neck supple. No tenderness.  Lymphadenopathy:     Cervical: No cervical adenopathy.  Skin:    Findings: No erythema or rash.  Neurological:     Mental Status: She is alert.  Psychiatric:        Mood and Affect: Mood normal.        Behavior: Behavior normal.         Outpatient Encounter Medications as of 10/09/2023  Medication Sig   albuterol  (VENTOLIN  HFA) 108 (90 Base) MCG/ACT inhaler INHALE 2 PUFFS INTO THE LUNGS EVERY 6 HOURS AS NEEDED FOR WHEEZING OR SHORTNESS OF BREATH   aluminum-magnesium  hydroxide-simethicone  (MAALOX) 200-200-20 MG/5ML SUSP Take 15 mLs by mouth 4 (four) times daily -  before meals and at bedtime.    aspirin  81 MG EC tablet Take 81 mg by mouth daily. Every other day   atorvastatin  (LIPITOR) 20 MG tablet TAKE 1 TABLET BY MOUTH AT BEDTIME   betamethasone  dipropionate 0.05 % cream Apply 1 application topically 2 (two) times daily.   budesonide -formoterol  (SYMBICORT ) 80-4.5 MCG/ACT inhaler Inhale 2 puffs into the lungs in the morning and at bedtime.   busPIRone  (BUSPAR ) 10 MG tablet Take 10 mg by mouth 3 (three) times daily.   calcium  citrate-vitamin D  (CITRACAL+D) 315-200 MG-UNIT tablet Take 2 tablets by mouth daily.   Cholecalciferol  (VITAMIN D3) 1000 UNITS CAPS Take 1 capsule by mouth daily.   clobetasol  cream (TEMOVATE ) 0.05 % Apply 1 application topically See admin instructions. Apply to affected areas of body 1 - 2 times daily as needed for itchy bumps. Avoid applying to face, groin,  and axilla. Use as directed.   cyanocobalamin  (VITAMIN B12) 1000 MCG/ML injection INJECT 1 ML INTO THE MUSCLE EVERY 3 MONTHS.   Diclofenac  Sodium 3 % GEL Apply topically 2 (two) times daily.   Docusate Sodium  (DSS) 100 MG CAPS Take 100 mg by  mouth 2 (two) times daily.   DULoxetine  (CYMBALTA ) 20 MG capsule Take 20 mg by mouth daily.   DULoxetine  (CYMBALTA ) 60 MG capsule Take 60 mg by mouth daily.   Fluocinolone  Acetonide 0.01 % OIL Apply 1-2 drops into ears once to twice daily as needed.   fluocinonide  (LIDEX ) 0.05 % external solution Apply topically 2 (two) times daily. Apply drops to scalp for itch   fluticasone  (FLONASE ) 50 MCG/ACT nasal spray Place 2 sprays into both nostrils daily.   furosemide  (LASIX ) 20 MG tablet TAKE 1 TABLET BY MOUTH DAILY AS NEEDED   gabapentin  (NEURONTIN ) 300 MG capsule TAKE 2 CAPSULES BY MOUTH 3 TIMES A DAY (IN THE MORNING, MIDDAY AND AT BEDTIME).   HYDROcodone -acetaminophen  (NORCO) 10-325 MG tablet Take 1 tablet by mouth 3 (three) times daily.   Infant Care Products [facility]) OINT Apply topically as directed (as discussed).   ipratropium (ATROVENT ) 0.03 % nasal spray Place 2 sprays into both nostrils every 12 (twelve) hours.   ketoconazole  (NIZORAL ) 2 % shampoo Apply 1 Application topically See admin instructions. apply three times per week, massage into scalp and leave in for 10 minutes before rinsing out   levalbuterol  (XOPENEX ) 0.63 MG/3ML nebulizer solution Take 3 mLs (0.63 mg total) by nebulization every 6 (six) hours as needed for wheezing or shortness of breath.   LINZESS  290 MCG CAPS capsule Take 290 mcg by mouth daily.   magnesium  oxide (MAG-OX) 400 MG tablet Take 1 tablet (400 mg total) by mouth daily.   metoprolol  tartrate (LOPRESSOR ) 25 MG tablet Take 25 mg by mouth daily.   morphine  (MS CONTIN ) 30 MG 12 hr tablet Take 30 mg by mouth every 12 (twelve) hours.   morphine  (MS CONTIN ) 60 MG 12 hr tablet Take 1 tablet (60 mg total) by mouth 2 (two) times daily.   mupirocin  ointment (BACTROBAN ) 2 % Apply 1 Application topically daily. Qd to excision site   nystatin  (MYCOSTATIN /NYSTOP ) powder Apply 1 application. topically 2 (two) times daily as needed.   nystatin  cream (MYCOSTATIN ) Apply 1  Application topically 2 (two) times daily.   pantoprazole  (PROTONIX ) 40 MG tablet Take 1 tablet (40 mg total) by mouth 2 (two) times daily.   polyethylene glycol powder (GLYCOLAX /MIRALAX ) 17 GM/SCOOP powder MIX 17 GRAMS AS MARKED ON BOTTLE TOP IN 8 OUNCES OF WATER AND DRINK ONCE A DAY AS DIRECTED.   predniSONE  (DELTASONE ) 10 MG tablet SMARTSIG:- Tablet(s) By Mouth -   Simethicone  (GAS-X PO) Take 2 tablets by mouth 2 (two) times daily.   sodium chloride  (OCEAN) 0.65 % nasal spray Place 1 spray into the nose as needed.   SYNTHROID  100 MCG tablet TAKE 1 TABLET BY MOUTH DAILY   triamcinolone  ointment (KENALOG ) 0.1 % APPLY TWICE DAILY TO BITES AND RASH UNTIL FLAT AND SMOOTH **DO NOT APPLY TO FACE**   [DISCONTINUED] amLODipine  (NORVASC ) 10 MG tablet TAKE 1 TABLET BY MOUTH DAILY   [DISCONTINUED] benazepril  (LOTENSIN ) 40 MG tablet TAKE 1 TABLET BY MOUTH DAILY   amLODipine  (NORVASC ) 10 MG tablet Take 1 tablet (10 mg total) by mouth daily.   benazepril  (LOTENSIN ) 40 MG tablet Take 1 tablet (40 mg total) by mouth daily.   [DISCONTINUED] LINZESS  145 MCG CAPS capsule  Take 145 mcg by mouth daily. (Patient not taking: Reported on 10/09/2023)   No facility-administered encounter medications on file as of 10/09/2023.     Lab Results  Component Value Date   WBC 4.9 08/22/2023   HGB 10.6 (L) 08/22/2023   HCT 32.8 (L) 08/22/2023   PLT 147 (L) 08/22/2023   GLUCOSE 87 08/22/2023   CHOL 125 05/19/2023   TRIG 67.0 05/19/2023   HDL 54.00 05/19/2023   LDLCALC 58 05/19/2023   ALT 13 07/29/2023   AST 22 07/29/2023   NA 138 08/22/2023   K 3.9 08/22/2023   CL 98 08/22/2023   CREATININE 0.79 08/22/2023   BUN 18 08/22/2023   CO2 29 08/22/2023   TSH 2.51 07/22/2023   INR 0.89 08/06/2018   HGBA1C 6.0 10/19/2014    CT ANGIO HEAD NECK W WO CM (CODE STROKE) Result Date: 07/29/2023 CLINICAL DATA:  Initial evaluation for acute TIA. EXAM: CT ANGIOGRAPHY HEAD AND NECK WITH AND WITHOUT CONTRAST TECHNIQUE: Multidetector  CT imaging of the head and neck was performed using the standard protocol during bolus administration of intravenous contrast. Multiplanar CT image reconstructions and MIPs were obtained to evaluate the vascular anatomy. Carotid stenosis measurements (when applicable) are obtained utilizing NASCET criteria, using the distal internal carotid diameter as the denominator. RADIATION DOSE REDUCTION: This exam was performed according to the departmental dose-optimization program which includes automated exposure control, adjustment of the mA and/or kV according to patient size and/or use of iterative reconstruction technique. CONTRAST:  75mL OMNIPAQUE  IOHEXOL  350 MG/ML SOLN COMPARISON:  CT from earlier the same day. FINDINGS: CTA NECK FINDINGS Aortic arch: Visualized aortic arch within normal limits for caliber with standard branch pattern. Aortic atherosclerosis. No stenosis about the origin the great vessels. Right carotid system: Right common and internal carotid arteries are patent without dissection. Atheromatous change about the right carotid bulb with estimated 40% stenosis by NASCET criteria. Left carotid system: Left common and internal carotid arteries are patent without dissection. Calcified plaque about the left carotid bulb without hemodynamically significant greater than 50% stenosis. Vertebral arteries: Both vertebral arteries arise from subclavian arteries. No dissection. Moderate stenosis involving the distal right V2 segment at the level of the right C3 transverse foramen due to extrinsic compression from uncovertebral disease (series 8, image 186). No other stenosis. Skeleton: No discrete or worrisome osseous lesions. Moderate to advanced cervical spondylosis, most pronounced at C3-4 and C4-5. Other neck: No other acute finding. Upper chest: 6 mm right upper lobe pulmonary nodule (series 6, image 162). Additional 4 mm left upper lobe pulmonary nodule (series 6, image 176). No other acute finding. Review  of the MIP images confirms the above findings CTA HEAD FINDINGS Anterior circulation: Mild atheromatous change about the carotid siphons without stenosis. A1 segments, anterior communicating artery complex common anterior cerebral arteries widely patent. No M1 stenosis or occlusion. Distal MCA branches perfused and symmetric. Posterior circulation: Both V4 segments patent without significant stenosis. Right PICA patent. Left PICA not well seen. Basilar patent without stenosis. Superior cerebellar and posterior cerebral arteries patent bilaterally. Venous sinuses: Patent allowing for timing the contrast bolus. Anatomic variants: As above.  No aneurysm. Review of the MIP images confirms the above findings IMPRESSION: 1. Negative CTA for large vessel occlusion or other emergent finding. 2. Mild atheromatous change about the carotid bifurcations and carotid siphons without hemodynamically significant or correctable stenosis. 3. Moderate stenosis involving the distal right V2 segment at the level of the right C3 transverse foramen due to  extrinsic compression from uncovertebral disease. 4.  Aortic Atherosclerosis (ICD10-I70.0). 5. Few bilateral pulmonary nodules, largest of which measures 6 mm at the right upper lobe. Per Fleischner Society Guidelines, recommend a non-contrast Chest CT at 3-6 months, then consider another non-contrast Chest CT at 18-24 months. If patient is low risk for malignancy, non-contrast Chest CT at 18-24 months is optional. These guidelines do not apply to immunocompromised patients and patients with cancer. Follow up in patients with significant comorbidities as clinically warranted. For lung cancer screening, adhere to Lung-RADS guidelines. Reference: Radiology. 2017; 284(1):228-43. Electronically Signed   By: Morene Hoard M.D.   On: 07/29/2023 20:57   CT Head Wo Contrast Result Date: 07/29/2023 CLINICAL DATA:  Altered mental status and disorientation. Worsening headache. EXAM: CT  HEAD WITHOUT CONTRAST TECHNIQUE: Contiguous axial images were obtained from the base of the skull through the vertex without intravenous contrast. RADIATION DOSE REDUCTION: This exam was performed according to the departmental dose-optimization program which includes automated exposure control, adjustment of the mA and/or kV according to patient size and/or use of iterative reconstruction technique. COMPARISON:  Head CT dated 02/22/2023. FINDINGS: Brain: Mild age-related atrophy and chronic microvascular ischemic changes. There is no acute intracranial hemorrhage. No mass effect or midline shift. No extra-axial fluid collection. Vascular: No hyperdense vessel or unexpected calcification. Skull: Normal. Negative for fracture or focal lesion. Sinuses/Orbits: There is partial opacification of the left sphenoid sinus. The remainder of the visualized paranasal sinuses and mastoid air cells are clear. Other: None IMPRESSION: 1. No acute intracranial pathology. 2. Mild age-related atrophy and chronic microvascular ischemic changes. Electronically Signed   By: Vanetta Chou M.D.   On: 07/29/2023 18:33       Assessment & Plan:  Tachycardia Assessment & Plan: Noted to be tachycardic on exam. EKG - question of afib. Ventricular rate on exam 135-150. Has a documented history of SVT. Took her metoprolol  this am. Has been having issues with bradycardia - previously. Evaluated by cardiology 08/13/23 - 40% SVE burden and non-sustained paroxysmal SVT. Bradycardic with average HR 61 bpm, range 52-89 bpm when in sinus rhythm. 46% of the time in bradycardia. 54% controlled rate. SVT fastest at 132 bpm with longest at 9 beats. Elected to defer pacemaker placement.  Given sustained increased heart rate and above history and noticed of increased sob with exertion, discussed need for further treatment and evaluation in ER. EMS notified. Daughter and caretaker notified.   Orders: -     EKG 12-Lead  Stress Assessment &  Plan: Followed by psychiatry.  Some increased stress recently. Dr Chipper increased cymbalta  - adding 20mg  q pm.    Bilateral shoulder pain, unspecified chronicity Assessment & Plan: Seeing pain clinic - improved.    Pulmonary nodules Assessment & Plan: Lung nodules noted on recent CT. Discussed need for f/u with pulmonary for further f/u and evaluation    OSA (obstructive sleep apnea) Assessment & Plan: Has seen pulmonary previously. On oxygen  at night.     Primary hypertension Assessment & Plan: Blood pressure as outlined.  Recheck improved.    Chronic respiratory failure with hypoxia (HCC) Assessment & Plan: Uses oxygen  at night.  Continue oxygen  - nocturnal.  Caretaker reported increased use of oxygent during the day - recently.    Congestive heart failure, unspecified HF chronicity, unspecified heart failure type (HCC) Assessment & Plan: ECHO 60% with G1DD.     Bradycardia Assessment & Plan: Evaluated by cardiology 08/13/23 - 40% SVE burden and non-sustained paroxysmal SVT. Bradycardic  with average HR 61 bpm, range 52-89 bpm when in sinus rhythm. 46% of the time in bradycardia. 54% controlled rate. SVT fastest at 132 bpm with longest at 9 beats. Elected to defer pacemaker placement.     Aortic atherosclerosis (HCC) Assessment & Plan: Continue lipitor.    Other orders -     amLODIPine  Besylate; Take 1 tablet (10 mg total) by mouth daily.  Dispense: 90 tablet; Refill: 1 -     Benazepril  HCl; Take 1 tablet (40 mg total) by mouth daily.  Dispense: 90 tablet; Refill: 1     Allena Hamilton, MD

## 2023-10-09 NOTE — Assessment & Plan Note (Signed)
 Continue lipitor  ?

## 2023-10-09 NOTE — Assessment & Plan Note (Signed)
 Blood pressure as outlined.  Recheck improved.

## 2023-10-09 NOTE — Assessment & Plan Note (Signed)
 Seeing pain clinic - improved.

## 2023-10-09 NOTE — H&P (Signed)
 History and Physical    Patient: Kimberly Roy DOB: [birth year] DOA: 10/09/2023 DOS: the patient was seen and examined on 10/09/2023 PCP: Glendia Shad, MD  Patient coming from: Home  Chief Complaint:  Chief Complaint  Patient presents with   Irregular Heart Beat        HPI: Kimberly Roy is a 89 y.o. female with medical history significant of diastolic CHF, SVT on metoprolol  fatty liver, hyperlipidemia, hypertension, chronic lymphedema, obstructive sleep apnea, pulmonary hypertension presented to the emergency department for evaluation of fast irregular heartbeat from PCP office.  Patient denied chest pain, shortness of breath, palpitations.  She does have exertional dyspnea which is not new.  Patient ambulates with rolling walker, uses 2 L oxygen  at night.  Patient was seen at primary care physician's office for routine visit when her heart rate is 135-150 and was directed to ER for further management.  Patient is accompanied by daughter and son.  Daughter who is a teacher, early years/pre states that she was on metoprolol  100 mg previously for SVT, gradually decreased to 25 mg due to slow HR.  She is also on steroid taper for back pain.  She denies any fever, chills, rigors, no sick contacts.  In the emergency department patient was noted to be tachycardic with heart rate 135.  EKG showed rapid A-fib at the rate of 120, no ST-T wave changes.  ED provider started her on Cardizem  drip, requested hospitalist admission for further management evaluation of A-fib with RVR.  Review of Systems: As mentioned in the history of present illness. All other systems reviewed and are negative. Past Medical History:  Diagnosis Date   Anemia    Anxiety    Chest pain    CHF (congestive heart failure) (HCC)    Constipation    DDD (degenerative disc disease), cervical    Depression    DVT (deep venous thrombosis) (HCC)    Dysphonia    Dyspnea    Fatty liver    Fatty liver    Headache    Hyperlipidemia     Hyperpiesia    Hypertension    Hypothyroidism    Interstitial cystitis    Left ventricular dysfunction    Lymphedema    Nephrolithiasis    Obstructive sleep apnea    Osteoarthritis    knees/cervical and lumbar spine   Pulmonary hypertension (HCC)    Pulmonary nodules    followed by Dr Theotis   Pure hypercholesterolemia    Renal cyst    right   Past Surgical History:  Procedure Laterality Date   ABDOMINAL HYSTERECTOMY     ovaries left in place   APPENDECTOMY     Back Surgeries     BACK SURGERY     BREAST REDUCTION SURGERY     3/99   CARDIAC CATHETERIZATION     cataracts Bilateral    CERVICAL SPINE SURGERY     ESOPHAGEAL MANOMETRY N/A 08/02/2015   Procedure: ESOPHAGEAL MANOMETRY (EM);  Surgeon: Donnice Vaughn Manes, MD;  Location: [facility] ENDOSCOPY;  Service: Endoscopy;  Laterality: N/A;   ESOPHAGOGASTRODUODENOSCOPY N/A 02/27/2015   Procedure: ESOPHAGOGASTRODUODENOSCOPY (EGD);  Surgeon: Deward CINDERELLA Piedmont, MD;  Location: [facility] ENDOSCOPY;  Service: Gastroenterology;  Laterality: N/A;   ESOPHAGOGASTRODUODENOSCOPY (EGD) WITH PROPOFOL  N/A 10/30/2018   Procedure: ESOPHAGOGASTRODUODENOSCOPY (EGD) WITH PROPOFOL ;  Surgeon: Gaylyn Gladis PENNER, MD;  Location: [facility] ENDOSCOPY;  Service: Endoscopy;  Laterality: N/A;   EXCISIONAL HEMORRHOIDECTOMY     EYE SURGERY     FRACTURE SURGERY  HEMORRHOID SURGERY     HIP SURGERY  2013   Right hip surgery   JOINT REPLACEMENT     KNEE ARTHROSCOPY     left and right   ORIF FEMUR FRACTURE Right 08/07/2018   Procedure: OPEN REDUCTION INTERNAL FIXATION (ORIF) DISTAL FEMUR FRACTURE;  Surgeon: Kathlynn Sharper, MD;  Location: [facility] ORS;  Service: Orthopedics;  Laterality: Right;   REDUCTION MAMMAPLASTY Bilateral YRS AGO   REPLACEMENT TOTAL KNEE Bilateral    rotator cuff surgery     blilateral   TONSILECTOMY/ADENOIDECTOMY WITH MYRINGOTOMY     VISCERAL ARTERY INTERVENTION N/A 02/25/2019   Procedure: VISCERAL ARTERY INTERVENTION;  Surgeon: Marea Selinda RAMAN, MD;  Location:  [facility] INVASIVE CV LAB;  Service: Cardiovascular;  Laterality: N/A;   Social History:  reports that she has never smoked. She has never used smokeless tobacco. She reports that she does not drink alcohol and does not use drugs.  Allergies  Allergen Reactions   Lyrica [Pregabalin] Swelling   Omnicef  [Cefdinir ] Diarrhea and Nausea And Vomiting   Atarax [Hydroxyzine]     jittery   Dicyclomine Other (See Comments)    Abdominal bloating    Hydroxyzine Hcl     jittery   Levaquin [Levofloxacin] Swelling   Nitrofurantoin  Diarrhea   Nucynta Er [Tapentadol Hcl Er] Other (See Comments)    Severe constipation    Oxybutynin Other (See Comments)    Blurred vision   Zoloft [Sertraline Hcl]     Severe headache   Biaxin [Clarithromycin] Other (See Comments) and Rash    Pt does not remember Pt does not remember   Meloxicam Rash   Other Rash    Other Reaction(s): Other (See Comments)  Durabond - redness, Durabond - redness   Sertraline Nausea And Vomiting    Severe headache  Other reaction(s): Headache   Silicone Other (See Comments) and Rash    Durabond - redness Durabond - redness    Durabond - redness   Sulfa Antibiotics Rash    Pt does not remember   Sulfasalazine Rash    Pt does not remember   Tape Rash    Durabond - redness   Tapentadol Other (See Comments) and Rash    Severe constipation Severe constipation Severe constipation Severe constipation Severe constipation   Venofer  [Iron  Sucrose] Rash    Family History  Problem Relation Age of Onset   Heart disease Mother    Stroke Mother    Hypertension Mother    Heart disease Father        myocardial infarction age 24   Breast cancer Neg Hx     Prior to Admission medications   Medication Sig Start Date End Date Taking? Authorizing Provider  albuterol  (VENTOLIN  HFA) 108 (90 Base) MCG/ACT inhaler INHALE 2 PUFFS INTO THE LUNGS EVERY 6 HOURS AS NEEDED FOR WHEEZING OR SHORTNESS OF BREATH 07/09/22   Glendia Shad, MD   aluminum-magnesium  hydroxide-simethicone  (MAALOX) 200-200-20 MG/5ML SUSP Take 15 mLs by mouth 4 (four) times daily -  before meals and at bedtime.     [provider]  amLODipine  (NORVASC ) 10 MG tablet Take 1 tablet (10 mg total) by mouth daily. 10/09/23   Glendia Shad, MD  aspirin  81 MG EC tablet Take 81 mg by mouth daily. Every other day    [provider]  atorvastatin  (LIPITOR) 20 MG tablet TAKE 1 TABLET BY MOUTH AT BEDTIME 10/06/23   Glendia Shad, MD  benazepril  (LOTENSIN ) 40 MG tablet Take 1 tablet (40 mg total) by  mouth daily. 10/09/23   Glendia Shad, MD  betamethasone  dipropionate 0.05 % cream Apply 1 application topically 2 (two) times daily. 10/19/20   [provider]  budesonide -formoterol  (SYMBICORT ) 80-4.5 MCG/ACT inhaler Inhale 2 puffs into the lungs in the morning and at bedtime. 10/30/22   Isadora Hose, MD  busPIRone  (BUSPAR ) 10 MG tablet Take 10 mg by mouth 3 (three) times daily. 03/31/18   [provider]  calcium  citrate-vitamin D  (CITRACAL+D) 315-200 MG-UNIT tablet Take 2 tablets by mouth daily.    [provider]  Cholecalciferol  (VITAMIN D3) 1000 UNITS CAPS Take 1 capsule by mouth daily.    [provider]  clobetasol  cream (TEMOVATE ) 0.05 % Apply 1 application topically See admin instructions. Apply to affected areas of body 1 - 2 times daily as needed for itchy bumps. Avoid applying to face, groin, and axilla. Use as directed. 07/30/21   Jackquline Sawyer, MD  cyanocobalamin  (VITAMIN B12) 1000 MCG/ML injection INJECT 1 ML INTO THE MUSCLE EVERY 3 MONTHS. 07/21/23   Brahmanday, Govinda R, MD  Diclofenac  Sodium 3 % GEL Apply topically 2 (two) times daily. 12/20/19   [provider]  Docusate Sodium  (DSS) 100 MG CAPS Take 100 mg by mouth 2 (two) times daily.    [provider]  DULoxetine  (CYMBALTA ) 20 MG capsule Take 20 mg by mouth daily. 10/07/23   [provider]  DULoxetine  (CYMBALTA ) 60 MG capsule  Take 60 mg by mouth daily. 02/20/22   [provider]  Fluocinolone  Acetonide 0.01 % OIL Apply 1-2 drops into ears once to twice daily as needed. 07/30/22   Jackquline Sawyer, MD  fluocinonide  (LIDEX ) 0.05 % external solution Apply topically 2 (two) times daily. Apply drops to scalp for itch 07/30/22   Jackquline Sawyer, MD  fluticasone  (FLONASE ) 50 MCG/ACT nasal spray Place 2 sprays into both nostrils daily. 07/29/22   Glendia Shad, MD  furosemide  (LASIX ) 20 MG tablet TAKE 1 TABLET BY MOUTH DAILY AS NEEDED 09/12/23   Glendia Shad, MD  gabapentin  (NEURONTIN ) 300 MG capsule TAKE 2 CAPSULES BY MOUTH 3 TIMES A DAY (IN THE MORNING, MIDDAY AND AT BEDTIME). 07/28/23   Glendia Shad, MD  HYDROcodone -acetaminophen  (NORCO) 10-325 MG tablet Take 1 tablet by mouth 3 (three) times daily. 08/10/18   Patel, Sona, MD  [employer] [facility]) OINT Apply topically as directed (as discussed). 12/26/22   Scott, Charlene, MD  ipratropium (ATROVENT ) 0.03 % nasal spray Place 2 sprays into both nostrils every 12 (twelve) hours. 03/20/22   Glendia Shad, MD  ketoconazole  (NIZORAL ) 2 % shampoo Apply 1 Application topically See admin instructions. apply three times per week, massage into scalp and leave in for 10 minutes before rinsing out 07/30/22   Jackquline Sawyer, MD  levalbuterol  (XOPENEX ) 0.63 MG/3ML nebulizer solution Take 3 mLs (0.63 mg total) by nebulization every 6 (six) hours as needed for wheezing or shortness of breath. 07/19/22   Glendia Shad, MD  LINZESS  290 MCG CAPS capsule Take 290 mcg by mouth daily. 09/27/23   [provider]  magnesium  oxide (MAG-OX) 400 MG tablet Take 1 tablet (400 mg total) by mouth daily. 09/22/20   Glendia Shad, MD  metoprolol  tartrate (LOPRESSOR ) 25 MG tablet Take 25 mg by mouth daily.    [provider]  morphine  (MS CONTIN ) 30 MG 12 hr tablet Take 30 mg by mouth every 12 (twelve) hours.    [provider]  morphine  (MS CONTIN ) 60 MG 12 hr  tablet Take 1 tablet (  60 mg total) by mouth 2 (two) times daily. 08/10/18   Patel, Sona, MD  mupirocin  ointment (BACTROBAN ) 2 % Apply 1 Application topically daily. Qd to excision site 08/18/23   Jackquline Sawyer, MD  nystatin  (MYCOSTATIN /NYSTOP ) powder Apply 1 application. topically 2 (two) times daily as needed. 01/21/22   Glendia Shad, MD  nystatin  cream (MYCOSTATIN ) Apply 1 Application topically 2 (two) times daily. 06/13/22   Glendia Shad, MD  pantoprazole  (PROTONIX ) 40 MG tablet Take 1 tablet (40 mg total) by mouth 2 (two) times daily. 07/29/23   Glendia Shad, MD  polyethylene glycol powder (GLYCOLAX /MIRALAX ) 17 GM/SCOOP powder MIX 17 GRAMS AS MARKED ON BOTTLE TOP IN 8 OUNCES OF WATER AND DRINK ONCE A DAY AS DIRECTED. 02/05/19   Glendia Shad, MD  predniSONE  (DELTASONE ) 10 MG tablet SMARTSIG:- Tablet(s) By Mouth - 09/09/23   [provider]  Simethicone  (GAS-X PO) Take 2 tablets by mouth 2 (two) times daily.    [provider]  sodium chloride  (OCEAN) 0.65 % nasal spray Place 1 spray into the nose as needed.    [provider]  SYNTHROID  100 MCG tablet TAKE 1 TABLET BY MOUTH DAILY 03/03/23   Glendia Shad, MD  triamcinolone  ointment (KENALOG ) 0.1 % APPLY TWICE DAILY TO BITES AND RASH UNTIL FLAT AND SMOOTH **DO NOT APPLY TO FACE** 07/16/21   Glendia Shad, MD    Physical Exam: Vitals:   10/09/23 1317 10/09/23 1318 10/09/23 1551  BP:  103/64 104/74  Pulse:  (!) 135 (!) 105  Resp:  20 20  Temp:   97.8 F (36.6 C)  SpO2:  92% 100%  Weight: 78.9 kg    Height: 5' 2 (1.575 m)     General - Elderly obese Caucasian female, no apparent distress HEENT - PERRLA, EOMI, atraumatic head, non tender sinuses. Lung - Clear, rales, rhonchi, wheezes. Heart - S1, S2 heard, no murmurs, rubs, trace pedal edema. Abdomen - soft, non tender, obese, bowel sounds good. Neuro - Alert, awake and oriented x 3, non focal exam. Skin - Warm and dry. Data Reviewed:     Latest  Ref Rng & Units 10/09/2023    1:07 PM 08/22/2023   12:55 PM 07/29/2023    4:27 PM  CBC  WBC 4.0 - 10.5 K/uL 9.8  4.9  6.5   Hemoglobin 12.0 - 15.0 g/dL 88.8  89.3  87.7   Hematocrit 36.0 - 46.0 % 34.1  32.8  37.2   Platelets 150 - 400 K/uL 231  147  151       Latest Ref Rng & Units 10/09/2023    1:07 PM 08/22/2023   12:55 PM 07/29/2023    4:27 PM  BMP  Glucose 70 - 99 mg/dL 871  87  893   BUN 8 - 23 mg/dL 28  18  16    Creatinine 0.44 - 1.00 mg/dL 9.01  9.20  9.20   Sodium 135 - 145 mmol/L 138  138  138   Potassium 3.5 - 5.1 mmol/L 4.2  3.9  3.9   Chloride 98 - 111 mmol/L 103  98  100   CO2 22 - 32 mmol/L 26  29  29    Calcium  8.9 - 10.3 mg/dL 9.4  9.6  9.6    DG Chest Port 1 View Result Date: 10/09/2023 CLINICAL DATA:  Shortness of breath. EXAM: PORTABLE CHEST 1 VIEW COMPARISON:  Chest radiograph dated 09/11/2022. FINDINGS: Shallow inspiration. Left lung base atelectasis. No pleural effusion pneumothorax. The cardiac silhouette.  Atherosclerotic calcification of the aorta. No acute osseous pathology. IMPRESSION: Left lung base atelectasis. Electronically Signed   By: Vanetta Chou M.D.   On: 10/09/2023 14:41     Assessment and Plan: Kimberly Roy is a 89 y.o. female with medical history significant of diastolic CHF, fatty liver, hyperlipidemia, hypertension, chronic lymphedema, obstructive sleep apnea, pulmonary hypertension presented to the emergency department for evaluation of fast irregular heartbeat from PCP office.  Patient does not have prior history of A-fib and is currently being evaluated for A-fib with RVR.  Cardiology consulted.  Plan: A-fib with RVR: New onset A-fib. Patient will be admitted to progressive care unit. Cardiology consultation called who recommended IV amiodarone  bolus and infusion for rate control.  Resume home p.o. metoprolol  therapy.   Wean off Cardizem  drip. Will start her on Eliquis  2.5 mg twice daily due to her age, bleeding risk. Hold aspirin .  TSH,  Free T4 wnl. Echocardiogram ordered. Continue telemetry monitoring.  Acute on chronic chronic diastolic CHF: BNP elevated 568,chest xray no pulmonary edema. Troponin flat. One-time IV Lasix  40 mg ordered. Resume p.o. dose from tomorrow. Monitor daily weights, strict input and output. Monitor electrolytes, renal function.  Chronic back pain Continue home regimen pain medications morphine , gabapentin . Patient states she is on steroid taper, prednisone  50 for another day and then later 40 for 2 day taper until 10mg .  Hypertension Continue beta blocker. Resume norvasc , benazepril  if BP elevated.  Hyperlipidemia Continue lipitor  Obesity class I BMI 31.81 Diet, exercise and weight reduction advised.  Hypothyroidism TSH, free T4 WNL. Continue home dose synthroid .  Anxiety and depression: Continue Cymbalta   GERD- On protonix . Maalox PRN.  Fall, aspiration precautions. PT/ OT evaluation. Nursing supportive care. DVT prophylaxis - on eliquis .   Advance Care Planning:   Code Status: Full Code discussed with patient, daughter and son at bedside.  Consults: [facility]  Family Communication: Daughter and son at bedside during my discussion. They understand and agree.  Severity of Illness: The appropriate patient status for this patient is INPATIENT. Inpatient status is judged to be reasonable and necessary in order to provide the required intensity of service to ensure the patient's safety. The patient's presenting symptoms, physical exam findings, and initial radiographic and laboratory data in the context of their chronic comorbidities is felt to place them at high risk for further clinical deterioration. Furthermore, it is not anticipated that the patient will be medically stable for discharge from the hospital within 2 midnights of admission.   * I certify that at the point of admission it is my clinical judgment that the patient will require inpatient hospital care  spanning beyond 2 midnights from the point of admission due to high intensity of service, high risk for further deterioration and high frequency of surveillance required.*  Author: Concepcion Riser, MD 10/09/2023 4:04 PM  For on call review [email or web address].

## 2023-10-09 NOTE — Assessment & Plan Note (Signed)
 Has seen pulmonary previously. On oxygen  at night.

## 2023-10-09 NOTE — ED Triage Notes (Signed)
 Presents via EMS from PCP  States she went fro routine visit  Found to have an abnormal heart rate  Denies any pain

## 2023-10-09 NOTE — Assessment & Plan Note (Signed)
 Uses oxygen  at night.  Continue oxygen  - nocturnal.  Caretaker reported increased use of oxygent during the day - recently.

## 2023-10-09 NOTE — Assessment & Plan Note (Signed)
 ECHO 60% with G1DD.

## 2023-10-09 NOTE — ED Notes (Addendum)
 Pt eating meal brought by family. Family remains at bedside. No needs at this time.

## 2023-10-09 NOTE — Consult Note (Signed)
 [facility] CARDIOLOGY CONSULT NOTE       Patient ID: Kimberly Roy MRN: [id number] DOB/AGE: 10-Aug-1931 89 y.o.  Admit date: 10/09/2023 Referring Physician Dr. Darci Primary Physician Glendia Shad, MD  Primary Cardiologist Dr. Ammon Reason for Consultation A-fib RVR  HPI: Kimberly Roy is a 89 y.o. female  with a past medical history of chronic diastolic heart failure, hypertension, hyperlipidemia, pulmonary hypertension, sinus bradycardia, mild to moderate TR who presented to the ED on 10/09/2023 for elevated heart rate.  She was seen in her PCP office earlier today and there was concern that she was in atrial fibrillation and thus she was sent to the ED for further evaluation.  Cardiology was consulted for further evaluation.   Patient reports that this morning she noticed that her BP was elevated.  She had regularly scheduled appointment with her PCP and BP was elevated at this appointment as well.  While she was at her PCP office she was noted to have an irregular heart rate, EKG was done which was concerning for atrial fibrillation and thus she was sent to the ED for further evaluation.  Workup in the ED for creatinine 0.98, potassium 4.2, magnesium  2.0, hemoglobin 11.1, WBC 9.8.  BNP elevated at 568.  Initial troponin normal at 10.  Heart rate maintaining in the 140s and thus she was started on diltiazem  infusion in the ED.  At the time of my evaluation she is resting comfortably in ED stretcher.  States that she could not tell that her heart rate was irregular or elevated.  States that usually her heart rate is on the slower side and she completed a monitor late last year for further evaluation of this.  She denies any palpitation symptoms, shortness of breath, chest pain, dizziness/lightheadedness.  Overall has been doing well recently without any issues.  States that she lives at home with her son and has caregivers that come to her house multiple times per week.  She uses a walker to  get around, she states that she did have 1 fall several weeks ago but feels that this was mechanical and states she got tripped up when this occurred.  Overall she feels about the same as she did when she first came in, she does not have any significant concerning symptoms or issues at the time of my evaluation.  Review of systems complete and found to be negative unless listed above    Past Medical History:  Diagnosis Date   Anemia    Anxiety    Chest pain    CHF (congestive heart failure) (HCC)    Constipation    DDD (degenerative disc disease), cervical    Depression    DVT (deep venous thrombosis) (HCC)    Dysphonia    Dyspnea    Fatty liver    Fatty liver    Headache    Hyperlipidemia    Hyperpiesia    Hypertension    Hypothyroidism    Interstitial cystitis    Left ventricular dysfunction    Lymphedema    Nephrolithiasis    Obstructive sleep apnea    Osteoarthritis    knees/cervical and lumbar spine   Pulmonary hypertension (HCC)    Pulmonary nodules    followed by Dr Theotis   Pure hypercholesterolemia    Renal cyst    right    Past Surgical History:  Procedure Laterality Date   ABDOMINAL HYSTERECTOMY     ovaries left in place   APPENDECTOMY  Back Surgeries     BACK SURGERY     BREAST REDUCTION SURGERY     3/99   CARDIAC CATHETERIZATION     cataracts Bilateral    CERVICAL SPINE SURGERY     ESOPHAGEAL MANOMETRY N/A 08/02/2015   Procedure: ESOPHAGEAL MANOMETRY (EM);  Surgeon: Donnice Vaughn Manes, MD;  Location: [facility] ENDOSCOPY;  Service: Endoscopy;  Laterality: N/A;   ESOPHAGOGASTRODUODENOSCOPY N/A 02/27/2015   Procedure: ESOPHAGOGASTRODUODENOSCOPY (EGD);  Surgeon: Deward CINDERELLA Piedmont, MD;  Location: [facility] ENDOSCOPY;  Service: Gastroenterology;  Laterality: N/A;   ESOPHAGOGASTRODUODENOSCOPY (EGD) WITH PROPOFOL  N/A 10/30/2018   Procedure: ESOPHAGOGASTRODUODENOSCOPY (EGD) WITH PROPOFOL ;  Surgeon: Gaylyn Gladis PENNER, MD;  Location: [facility] ENDOSCOPY;  Service: Endoscopy;   Laterality: N/A;   EXCISIONAL HEMORRHOIDECTOMY     EYE SURGERY     FRACTURE SURGERY     HEMORRHOID SURGERY     HIP SURGERY  2013   Right hip surgery   JOINT REPLACEMENT     KNEE ARTHROSCOPY     left and right   ORIF FEMUR FRACTURE Right 08/07/2018   Procedure: OPEN REDUCTION INTERNAL FIXATION (ORIF) DISTAL FEMUR FRACTURE;  Surgeon: Kathlynn Sharper, MD;  Location: [facility] ORS;  Service: Orthopedics;  Laterality: Right;   REDUCTION MAMMAPLASTY Bilateral YRS AGO   REPLACEMENT TOTAL KNEE Bilateral    rotator cuff surgery     blilateral   TONSILECTOMY/ADENOIDECTOMY WITH MYRINGOTOMY     VISCERAL ARTERY INTERVENTION N/A 02/25/2019   Procedure: VISCERAL ARTERY INTERVENTION;  Surgeon: Marea Selinda RAMAN, MD;  Location: [facility] INVASIVE CV LAB;  Service: Cardiovascular;  Laterality: N/A;    (Not in a hospital admission)  Social History   Socioeconomic History   Marital status: Widowed    Spouse name: Not on file   Number of children: 2   Years of education: 43   Highest education level: Associate degree: occupational, scientist, product/process development, or vocational program  Occupational History    Comment: engineer, site  Tobacco Use   Smoking status: Never   Smokeless tobacco: Never  Vaping Use   Vaping status: Never Used  Substance and Sexual Activity   Alcohol use: No    Alcohol/week: 0.0 standard drinks of alcohol   Drug use: Never   Sexual activity: Not Currently  Other Topics Concern   Not on file  Social History Narrative   Not on file   Social Drivers of Health   Financial Resource Strain: Low Risk  (09/16/2023)   Overall Financial Resource Strain (CARDIA)    Difficulty of Paying Living Expenses: Not very hard  Food Insecurity: No Food Insecurity (09/16/2023)   Hunger Vital Sign    Worried About Running Out of Food in the Last Year: Never true    Ran Out of Food in the Last Year: Never true  Transportation Needs: No Transportation Needs (09/16/2023)   PRAPARE - Scientist, Research (physical Sciences) (Medical): No    Lack of Transportation (Non-Medical): No  Physical Activity: Inactive (09/16/2023)   Exercise Vital Sign    Days of Exercise per Week: 0 days    Minutes of Exercise per Session: 10 min  Stress: No Stress Concern Present (09/16/2023)   [employer] of Occupational Health - Occupational Stress Questionnaire    Feeling of Stress : Not at all  Social Connections: Socially Isolated (09/16/2023)   Social Connection and Isolation Panel [NHANES]    Frequency of Communication with Friends and Family: More than three times a week    Frequency of Social Gatherings with  Friends and Family: More than three times a week    Attends Religious Services: Never    Database Administrator or Organizations: No    Attends Banker Meetings: Never    Marital Status: Widowed  Intimate Partner Violence: Not At Risk (11/25/2022)   Humiliation, Afraid, Rape, and Kick questionnaire    Fear of Current or Ex-Partner: No    Emotionally Abused: No    Physically Abused: No    Sexually Abused: No    Family History  Problem Relation Age of Onset   Heart disease Mother    Stroke Mother    Hypertension Mother    Heart disease Father        myocardial infarction age 51   Breast cancer Neg Hx      Vitals:   10/09/23 1317 10/09/23 1318 10/09/23 1551  BP:  103/64 104/74  Pulse:  (!) 135 (!) 105  Resp:  20 20  Temp:   97.8 F (36.6 C)  SpO2:  92% 100%  Weight: 78.9 kg    Height: 5' 2 (1.575 m)      PHYSICAL EXAM General: Appearing elderly female, well nourished, in no acute distress. HEENT: Normocephalic and atraumatic. Neck: No JVD.  Lungs: Normal respiratory effort on 2L [place]. Clear bilaterally to auscultation. No wheezes, crackles, rhonchi.  Heart: Irregularly irregular, elevated rate. Normal S1 and S2 without gallops or murmurs.  Abdomen: Non-distended appearing.  Msk: Normal strength and tone for age. Extremities: Warm and well perfused. No clubbing,  cyanosis.  No edema.  Neuro: Alert and oriented X 3. Psych: Answers questions appropriately.   Labs: Basic Metabolic Panel: Recent Labs    10/09/23 1307  NA 138  K 4.2  CL 103  CO2 26  GLUCOSE 128*  BUN 28*  CREATININE 0.98  CALCIUM  9.4  MG 2.0   Liver Function Tests: Recent Labs    10/09/23 1307  AST 22  ALT 17  ALKPHOS 83  BILITOT 0.7  PROT 6.9  ALBUMIN 3.8   No results for input(s): LIPASE, AMYLASE in the last 72 hours. CBC: Recent Labs    10/09/23 1307  WBC 9.8  NEUTROABS 8.5*  HGB 11.1*  HCT 34.1*  MCV 103.0*  PLT 231   Cardiac Enzymes: Recent Labs    10/09/23 1307  TROPONINIHS 10   BNP: Recent Labs    10/09/23 1307  BNP 568.6*   D-Dimer: Recent Labs    10/09/23 1307  DDIMER 0.78*   Hemoglobin A1C: No results for input(s): HGBA1C in the last 72 hours. Fasting Lipid Panel: No results for input(s): CHOL, HDL, LDLCALC, TRIG, CHOLHDL, LDLDIRECT in the last 72 hours. Thyroid  Function Tests: Recent Labs    10/09/23 1307  TSH 0.468   Anemia Panel: No results for input(s): VITAMINB12, FOLATE, FERRITIN, TIBC, IRON , RETICCTPCT in the last 72 hours.   Radiology: [facility] Chest Port 1 View Result Date: 10/09/2023 CLINICAL DATA:  Shortness of breath. EXAM: PORTABLE CHEST 1 VIEW COMPARISON:  Chest radiograph dated 09/11/2022. FINDINGS: Shallow inspiration. Left lung base atelectasis. No pleural effusion pneumothorax. The cardiac silhouette. Atherosclerotic calcification of the aorta. No acute osseous pathology. IMPRESSION: Left lung base atelectasis. Electronically Signed   By: Vanetta Chou M.D.   On: 10/09/2023 14:41    ECHO ordered  TELEMETRY reviewed by me 10/09/2023: Atrial fibrillation rate 100s  EKG reviewed by me: atrial fibrillation RVR rate 136 bpm  Data reviewed by me 10/09/2023: last 24h vitals tele labs imaging I/O  ED provider note, admission H&P  Principal Problem:   Atrial fibrillation with RVR (HCC)     ASSESSMENT AND PLAN:  Kimberly Roy is a 89 y.o. female  with a past medical history of chronic diastolic heart failure, hypertension, hyperlipidemia, pulmonary hypertension, sinus bradycardia, mild to moderate TR who presented to the ED on 10/09/2023 for elevated heart rate.  She was seen in her PCP office earlier today and there was concern that she was in atrial fibrillation and thus she was sent to the ED for further evaluation.  Cardiology was consulted for further evaluation.   # New onset atrial fibrillation # Atrial fibrillation RVR # Acute on chronic HFpEF Patient with history of chronic HFpEF, sinus bradycardia and frequent supraventricular ectopy on recent Holter monitor presenting from primary care office due to concern for atrial fibrillation RVR.  She had elevated heart rates in the 140s on admission and was started on IV diltiazem  for rate control.  Now has improvement in heart rate to the 100s with apparent atrial fibrillation on telemetry. -Start IV amiodarone  bolus and infusion for rhythm control.  Will resume home p.o. metoprolol .  Wean off of diltiazem  infusion as able. -Start Eliquis  2.5 mg twice daily, reduced dose due to age and concern for bleeding complications. -Echo ordered. -Will give one-time dose of IV Lasix  40 mg daily and resume p.o. dose tomorrow.  This patient's plan of care was discussed and created with Dr. Florencio and he is in agreement.  Signed: Danita Bloch, PA-C  10/09/2023, 4:20 PM [facility] Cardiology

## 2023-10-09 NOTE — ED Provider Notes (Signed)
 [facility] Provider Note    Event Date/Time   First MD Initiated Contact with Patient 10/09/23 1256     (approximate)   History   Irregular Heart Beat (/)   HPI  SHAUNETTE GASSNER is a 89 year old female with history of HTN, CHF, SVT presenting to the emergency department for evaluation of tachycardia.  Seen by her primary care doctor today for routine visit where she noted to have heart rate of 135-150 so she was directed to the ER for further evaluation.  Reports that she has had some mild shortness of breath.  Denies history of similar tachycardia episodes, reports she usually has a low heart rate.  Denies chest pain.  Was recently placed on steroids for back pain.     Physical Exam   Triage Vital Signs: ED Triage Vitals  Encounter Vitals Group     BP 10/09/23 1318 103/64     Systolic BP Percentile --      Diastolic BP Percentile --      Pulse Rate 10/09/23 1318 (!) 135     Resp 10/09/23 1318 20     Temp --      Temp src --      SpO2 10/09/23 1318 92 %     Weight 10/09/23 1317 173 lb 15.1 oz (78.9 kg)     Height 10/09/23 1317 5' 2 (1.575 m)     Head Circumference --      Peak Flow --      Pain Score 10/09/23 1318 0     Pain Loc --      Pain Education --      Exclude from Growth Chart --     Most recent vital signs: Vitals:   10/09/23 1318  BP: 103/64  Pulse: (!) 135  Resp: 20  SpO2: 92%     General: Awake, interactive  CV:  Tachycardic with irregularly irregular rhythm, heart rates ranging from the 120s to 140s Resp:  Unlabored respirations, lungs clear to auscultation Abd:  Nondistended, soft, nontender to palpation Neuro:  Symmetric facial movement, fluid speech   ED Results / Procedures / Treatments   Labs (all labs ordered are listed, but only abnormal results are displayed) Labs Reviewed  CBC WITH DIFFERENTIAL/PLATELET - Abnormal; Notable for the following components:      Result Value   RBC 3.31 (*)    Hemoglobin 11.1  (*)    HCT 34.1 (*)    MCV 103.0 (*)    Neutro Abs 8.5 (*)    Abs Immature Granulocytes 0.08 (*)    All other components within normal limits  COMPREHENSIVE METABOLIC PANEL - Abnormal; Notable for the following components:   Glucose, Bld 128 (*)    BUN 28 (*)    GFR, Estimated 55 (*)    All other components within normal limits  D-DIMER, QUANTITATIVE - Abnormal; Notable for the following components:   D-Dimer, Quant 0.78 (*)    All other components within normal limits  MAGNESIUM   TSH  T4, FREE  BRAIN NATRIURETIC PEPTIDE  TROPONIN I (HIGH SENSITIVITY)  TROPONIN I (HIGH SENSITIVITY)     EKG EKG independently reviewed interpreted by myself (ER attending) demonstrates:  EKG demonstrates A-fib at a rate of 120, QRS 118, QTc 474, no acute ST changes noted, incomplete right bundle branch block  RADIOLOGY Imaging independently reviewed and interpreted by myself demonstrates:  CXR without obvious focal consolidation on my review  PROCEDURES:  Critical Care performed: Yes,  see critical care procedure note(s)  CRITICAL CARE Performed by: Nilsa Dade   Total critical care time: 32 minutes  Critical care time was exclusive of separately billable procedures and treating other patients.  Critical care was necessary to treat or prevent imminent or life-threatening deterioration.  Critical care was time spent personally by me on the following activities: development of treatment plan with patient and/or surrogate as well as nursing, discussions with consultants, evaluation of patient's response to treatment, examination of patient, obtaining history from patient or surrogate, ordering and performing treatments and interventions, ordering and review of laboratory studies, ordering and review of radiographic studies, pulse oximetry and re-evaluation of patient's condition.   Procedures   MEDICATIONS ORDERED IN ED: Medications  diltiazem  (CARDIZEM ) 125 mg in dextrose  5% 125 mL (1  mg/mL) infusion (5 mg/hr Intravenous New Bag/Given 10/09/23 1413)  sodium chloride  0.9 % bolus 500 mL (has no administration in time range)  diltiazem  (CARDIZEM ) injection 10 mg (10 mg Intravenous Given 10/09/23 1408)     IMPRESSION / MDM / ASSESSMENT AND PLAN / ED COURSE  I reviewed the triage vital signs and the nursing notes.  Differential diagnosis includes, but is not limited to, electrolyte abnormality, anemia, arrhythmia, thyroid  dysfunction, pulmonary embolism, pneumonia, lower suspicion ACS  Patient's presentation is most consistent with acute presentation with potential threat to life or bodily function.  89 year old female presenting to the emergency department for evaluation of tachycardia, EKG with A-fib with RVR, no known history.  Stable blood pressure.  Patient with mild shortness of breath, otherwise without significant symptoms.  X-[place] without obvious pneumonia on my review.  Labs without critical derangements.  D-dimer within normal limits for age-adjusted criteria.  Patient was given a diltiazem  bolus and placed on a drip with improvement in her heart rate to the low 100s.  BP stable.  Discussed admission and patient is comfortable with this plan.  Will reach out to hospitalist team.  Case discussed with hospitalist team.  They will evaluate the patient for anticipated admission.     FINAL CLINICAL IMPRESSION(S) / ED DIAGNOSES   Final diagnoses:  Atrial fibrillation with rapid ventricular response (HCC)     Rx / DC Orders   ED Discharge Orders     None        Note:  This document was prepared using Dragon voice recognition software and may include unintentional dictation errors.   Dade Nilsa, MD 10/09/23 [phone]

## 2023-10-10 ENCOUNTER — Telehealth (HOSPITAL_COMMUNITY): Payer: Self-pay | Admitting: Pharmacy Technician

## 2023-10-10 ENCOUNTER — Encounter: Payer: Self-pay | Admitting: Internal Medicine

## 2023-10-10 ENCOUNTER — Other Ambulatory Visit (HOSPITAL_COMMUNITY): Payer: Self-pay

## 2023-10-10 DIAGNOSIS — I5033 Acute on chronic diastolic (congestive) heart failure: Secondary | ICD-10-CM | POA: Diagnosis not present

## 2023-10-10 DIAGNOSIS — I471 Supraventricular tachycardia, unspecified: Secondary | ICD-10-CM | POA: Diagnosis not present

## 2023-10-10 DIAGNOSIS — E66811 Obesity, class 1: Secondary | ICD-10-CM | POA: Diagnosis not present

## 2023-10-10 DIAGNOSIS — I4891 Unspecified atrial fibrillation: Secondary | ICD-10-CM | POA: Diagnosis not present

## 2023-10-10 DIAGNOSIS — G4733 Obstructive sleep apnea (adult) (pediatric): Secondary | ICD-10-CM

## 2023-10-10 LAB — BASIC METABOLIC PANEL
Anion gap: 10 (ref 5–15)
BUN: 24 mg/dL — ABNORMAL HIGH (ref 8–23)
CO2: 29 mmol/L (ref 22–32)
Calcium: 9.2 mg/dL (ref 8.9–10.3)
Chloride: 100 mmol/L (ref 98–111)
Creatinine, Ser: 1.05 mg/dL — ABNORMAL HIGH (ref 0.44–1.00)
GFR, Estimated: 50 mL/min — ABNORMAL LOW (ref >60)
Glucose, Bld: 98 mg/dL (ref 70–99)
Potassium: 3.9 mmol/L (ref 3.5–5.1)
Sodium: 139 mmol/L (ref 135–145)

## 2023-10-10 LAB — CBC
HCT: 32.4 % — ABNORMAL LOW (ref 36.0–46.0)
Hemoglobin: 10.4 g/dL — ABNORMAL LOW (ref 12.0–15.0)
MCH: 32.8 pg (ref 26.0–34.0)
MCHC: 32.1 g/dL (ref 30.0–36.0)
MCV: 102.2 fL — ABNORMAL HIGH (ref 80.0–100.0)
Platelets: 213 10*3/uL (ref 150–400)
RBC: 3.17 MIL/uL — ABNORMAL LOW (ref 3.87–5.11)
RDW: 13.7 % (ref 11.5–15.5)
WBC: 7.7 10*3/uL (ref 4.0–10.5)
nRBC: 0 % (ref 0.0–0.2)

## 2023-10-10 MED ORDER — PREDNISONE 20 MG PO TABS
40.0000 mg | ORAL_TABLET | Freq: Every day | ORAL | Status: AC
  Administered 2023-10-11 – 2023-10-12 (×2): 40 mg via ORAL
  Filled 2023-10-10 (×2): qty 2

## 2023-10-10 MED ORDER — AMIODARONE HCL 200 MG PO TABS
400.0000 mg | ORAL_TABLET | Freq: Two times a day (BID) | ORAL | Status: DC
  Administered 2023-10-10 – 2023-10-12 (×5): 400 mg via ORAL
  Filled 2023-10-10 (×5): qty 2

## 2023-10-10 MED ORDER — PREDNISONE 10 MG PO TABS: 10.0000 mg | ORAL_TABLET | Freq: Every day | ORAL | Status: DC

## 2023-10-10 MED ORDER — LEVOTHYROXINE SODIUM 100 MCG PO TABS
100.0000 ug | ORAL_TABLET | Freq: Every day | ORAL | Status: DC
Start: 2023-10-11 — End: 2023-10-12
  Administered 2023-10-11 – 2023-10-12 (×2): 100 ug via ORAL
  Filled 2023-10-10 (×2): qty 1

## 2023-10-10 MED ORDER — PREDNISONE 20 MG PO TABS
50.0000 mg | ORAL_TABLET | Freq: Every day | ORAL | Status: AC
  Administered 2023-10-10: 50 mg via ORAL
  Filled 2023-10-10: qty 1

## 2023-10-10 MED ORDER — BUSPIRONE HCL 10 MG PO TABS
10.0000 mg | ORAL_TABLET | Freq: Three times a day (TID) | ORAL | Status: DC
  Administered 2023-10-10 – 2023-10-12 (×6): 10 mg via ORAL
  Filled 2023-10-10 (×6): qty 1

## 2023-10-10 MED ORDER — ATORVASTATIN CALCIUM 20 MG PO TABS
20.0000 mg | ORAL_TABLET | Freq: Every day | ORAL | Status: DC
  Administered 2023-10-10 – 2023-10-11 (×2): 20 mg via ORAL
  Filled 2023-10-10 (×2): qty 1

## 2023-10-10 MED ORDER — ONDANSETRON HCL 4 MG/2ML IJ SOLN: 4.0000 mg | Freq: Four times a day (QID) | INTRAMUSCULAR | Status: DC | PRN

## 2023-10-10 MED ORDER — PANTOPRAZOLE SODIUM 40 MG PO TBEC
40.0000 mg | DELAYED_RELEASE_TABLET | Freq: Two times a day (BID) | ORAL | Status: DC
  Administered 2023-10-10 – 2023-10-12 (×5): 40 mg via ORAL
  Filled 2023-10-10 (×5): qty 1

## 2023-10-10 MED ORDER — VITAMIN D3 25 MCG (1000 UNIT) PO TABS
1000.0000 [IU] | ORAL_TABLET | Freq: Every day | ORAL | Status: DC
  Administered 2023-10-10 – 2023-10-12 (×3): 1000 [IU] via ORAL
  Filled 2023-10-10 (×7): qty 1

## 2023-10-10 MED ORDER — PREDNISONE 20 MG PO TABS: 20.0000 mg | ORAL_TABLET | Freq: Every day | ORAL | Status: DC

## 2023-10-10 MED ORDER — BENAZEPRIL HCL 20 MG PO TABS
40.0000 mg | ORAL_TABLET | Freq: Every day | ORAL | Status: DC
Start: 2023-10-10 — End: 2023-10-12
  Administered 2023-10-10 – 2023-10-12 (×3): 40 mg via ORAL
  Filled 2023-10-10 (×3): qty 2

## 2023-10-10 MED ORDER — AMIODARONE HCL 200 MG PO TABS: 200.0000 mg | ORAL_TABLET | Freq: Every day | ORAL | Status: DC

## 2023-10-10 MED ORDER — MORPHINE SULFATE ER 15 MG PO TBCR
60.0000 mg | EXTENDED_RELEASE_TABLET | Freq: Two times a day (BID) | ORAL | Status: DC
  Administered 2023-10-10 – 2023-10-12 (×5): 60 mg via ORAL
  Filled 2023-10-10 (×5): qty 4

## 2023-10-10 MED ORDER — ACETAMINOPHEN 325 MG PO TABS: 650.0000 mg | ORAL_TABLET | ORAL | Status: DC | PRN

## 2023-10-10 MED ORDER — LINACLOTIDE 145 MCG PO CAPS
290.0000 ug | ORAL_CAPSULE | Freq: Every day | ORAL | Status: DC
  Administered 2023-10-10 – 2023-10-12 (×3): 290 ug via ORAL
  Filled 2023-10-10: qty 2
  Filled 2023-10-10: qty 1
  Filled 2023-10-10 (×2): qty 2
  Filled 2023-10-10: qty 1

## 2023-10-10 MED ORDER — MAGNESIUM OXIDE 400 MG PO TABS
400.0000 mg | ORAL_TABLET | Freq: Every day | ORAL | Status: DC
  Administered 2023-10-10 – 2023-10-12 (×3): 400 mg via ORAL
  Filled 2023-10-10 (×7): qty 1

## 2023-10-10 MED ORDER — DULOXETINE HCL 20 MG PO CPEP
20.0000 mg | ORAL_CAPSULE | Freq: Every day | ORAL | Status: DC
  Administered 2023-10-10 – 2023-10-11 (×2): 20 mg via ORAL
  Filled 2023-10-10 (×2): qty 1

## 2023-10-10 MED ORDER — HYDROCODONE-ACETAMINOPHEN 10-325 MG PO TABS: 1.0000 | ORAL_TABLET | Freq: Three times a day (TID) | ORAL | Status: DC | PRN

## 2023-10-10 MED ORDER — PREDNISONE 20 MG PO TABS: 30.0000 mg | ORAL_TABLET | Freq: Every day | ORAL | Status: DC

## 2023-10-10 MED ORDER — DULOXETINE HCL 30 MG PO CPEP
60.0000 mg | ORAL_CAPSULE | Freq: Every day | ORAL | Status: DC
  Administered 2023-10-11 – 2023-10-12 (×2): 60 mg via ORAL
  Filled 2023-10-10 (×3): qty 2

## 2023-10-10 MED ORDER — POLYETHYLENE GLYCOL 3350 17 G PO PACK
17.0000 g | PACK | Freq: Every day | ORAL | Status: DC
  Administered 2023-10-10 – 2023-10-11 (×2): 17 g via ORAL
  Filled 2023-10-10 (×3): qty 1

## 2023-10-10 MED ORDER — MORPHINE SULFATE ER 15 MG PO TBCR
30.0000 mg | EXTENDED_RELEASE_TABLET | Freq: Every day | ORAL | Status: DC
  Administered 2023-10-10 – 2023-10-11 (×2): 30 mg via ORAL
  Filled 2023-10-10 (×2): qty 2

## 2023-10-10 NOTE — Telephone Encounter (Signed)
 Patient Product/process development scientist completed.    The patient is insured through [facility]. Patient has Medicare and is not eligible for a copay card, but may be able to apply for patient assistance or Medicare RX Payment Plan (Patient Must reach out to their plan, if eligible for payment plan), if available.    Ran test claim for Eliquis 5 mg and the current 30 day co-pay is $45.00.   This test claim was processed through [facility]- copay amounts may vary at other pharmacies due to pharmacy/plan contracts, or as the patient moves through the different stages of their insurance plan.     Roland Earl, CPHT Pharmacy Technician III Certified Patient Advocate [facility] Pharm[facility] Patient Advocate Team Direct Number: [phone]  Fax: [phone]

## 2023-10-10 NOTE — Progress Notes (Signed)
 Progress Note   Patient: Kimberly Roy FMW:[id number] DOB: 06-05-1931 DOA: 10/09/2023     1 DOS: the patient was seen and examined on 10/10/2023   Brief hospital course: VENNA BERBERICH is a 89 y.o. female with medical history significant of diastolic CHF, SVT on metoprolol  fatty liver, hyperlipidemia, hypertension, chronic lymphedema, obstructive sleep apnea, pulmonary hypertension presented to the emergency department for evaluation of fast irregular heartbeat from PCP office.  Patient denied chest pain, shortness of breath, palpitations.  Patient is admitted for new onset A-fib, RVR.  Cardiology evaluated her and recommended amiodarone  bolus and drip.  Assessment and Plan: A-fib with RVR: New onset A-fib. Heart rate better control today. Cardiology follow-up appreciated IV amiodarone  drip to be changed to oral amio.  Off Cardizem  drip Continue metoprolol  therapy.   Continue Eliquis  2.5 mg twice daily due to her age, bleeding risk. Hold aspirin .  Echocardiogram pending. Continue telemetry monitoring.   Acute on chronic chronic diastolic CHF: BNP elevated 568,chest xray no pulmonary edema. Troponin flat. Got one-time IV Lasix  40 mg yesterday, resumed p.o. 20mg  from tomorrow. Monitor daily weights, strict input and output. Monitor electrolytes, renal function.   Chronic back pain Continue home regimen pain medications morphine , gabapentin . Continue prednisone  taper. She follows orthopedics for shots.   Hypertension Continue beta blocker. Resumed benazepril .   Hyperlipidemia Continue lipitor   Obesity class I BMI 31.81 Diet, exercise and weight reduction advised.   Hypothyroidism TSH, free T4 WNL. Continue home dose synthroid .   Anxiety and depression: Continue Cymbalta    GERD- On protonix . Maalox PRN.  Out of bed to chair. Incentive spirometry. Nursing supportive care. Fall, aspiration precautions. DVT prophylaxis   Code Status: Full Code  Subjective: Patient is seen  and examined today morning. She is eating poor. Did not get out of bed. No chest pain, palpitations. HR well controlled. No family at bedside.  Physical Exam: Vitals:   10/10/23 0745 10/10/23 0749 10/10/23 1215 10/10/23 1430  BP: (!) 147/72   (!) 162/83  Pulse: 85  82 87  Resp: 16  20 (!) 25  Temp:  98.8 F (37.1 C)    TempSrc:  Oral    SpO2: 98%  93% 96%  Weight:      Height:        General - Elderly obese Caucasian female, no apparent distress HEENT - PERRLA, EOMI, atraumatic head, non tender sinuses. Lung - Clear, basal rales, no rhonchi, wheezes. Heart - S1, S2 heard, no murmurs, rubs, trace pedal edema. Abdomen - Soft, non tender, bowel sounds good Neuro - Alert, awake and oriented x 3, non focal exam. Skin - Warm and dry.  Data Reviewed:      Latest Ref Rng & Units 10/10/2023    7:51 AM 10/09/2023    1:07 PM 08/22/2023   12:55 PM  CBC  WBC 4.0 - 10.5 K/uL 7.7  9.8  4.9   Hemoglobin 12.0 - 15.0 g/dL 89.5  88.8  89.3   Hematocrit 36.0 - 46.0 % 32.4  34.1  32.8   Platelets 150 - 400 K/uL 213  231  147       Latest Ref Rng & Units 10/10/2023    7:51 AM 10/09/2023    1:07 PM 08/22/2023   12:55 PM  BMP  Glucose 70 - 99 mg/dL 98  871  87   BUN 8 - 23 mg/dL 24  28  18    Creatinine 0.44 - 1.00 mg/dL 8.94  9.01  9.20  Sodium 135 - 145 mmol/L 139  138  138   Potassium 3.5 - 5.1 mmol/L 3.9  4.2  3.9   Chloride 98 - 111 mmol/L 100  103  98   CO2 22 - 32 mmol/L 29  26  29    Calcium  8.9 - 10.3 mg/dL 9.2  9.4  9.6    DG Chest Port 1 View Result Date: 10/09/2023 CLINICAL DATA:  Shortness of breath. EXAM: PORTABLE CHEST 1 VIEW COMPARISON:  Chest radiograph dated 09/11/2022. FINDINGS: Shallow inspiration. Left lung base atelectasis. No pleural effusion pneumothorax. The cardiac silhouette. Atherosclerotic calcification of the aorta. No acute osseous pathology. IMPRESSION: Left lung base atelectasis. Electronically Signed   By: Vanetta Chou M.D.   On: 10/09/2023 14:41   Family  Communication: Discussed with patient, she understand and agree. All questions answereed.  Disposition: Status is: Inpatient Remains inpatient appropriate because:   Planned Discharge Destination: Home with Home Health     Time spent: 40 minutes  Author: Concepcion Riser, MD 10/10/2023 3:28 PM Secure chat 7am to 7pm For on call review [email or web address].

## 2023-10-10 NOTE — ED Notes (Signed)
 This NT assisted pt to bedside commode. Pt unable to have a bowel movement at this time. NT assisted pt back to bed and placed purwick in place.

## 2023-10-10 NOTE — Progress Notes (Signed)
 [facility] CLINIC CARDIOLOGY PROGRESS NOTE       Patient ID: Kimberly Roy MRN: [id number] DOB/AGE: 88-Sep-1932 88 y.o.  Admit date: 10/09/2023 Referring Physician Dr. Darci Primary Physician Glendia Shad, MD  Primary Cardiologist Dr. Ammon Reason for Consultation A-fib RVR  HPI: Kimberly Roy is a 89 y.o. female  with a past medical history of chronic diastolic heart failure, hypertension, hyperlipidemia, pulmonary hypertension, sinus bradycardia, mild to moderate TR who presented to the ED on 10/09/2023 for elevated heart rate.  She was seen in her PCP office earlier today and there was concern that she was in atrial fibrillation and thus she was sent to the ED for further evaluation.  Cardiology was consulted for further evaluation.   Interval history: -Patient seen and examined this morning, reports she is feeling much better. -Heart rate much better controlled today.  She denies any chest pain or palpitation symptoms. -States that her shortness of breath has improved.  She does remain on supplemental O2. -Renal function is relatively stable with diuresis.  Review of systems complete and found to be negative unless listed above    Past Medical History:  Diagnosis Date   Anemia    Anxiety    Chest pain    CHF (congestive heart failure) (HCC)    Constipation    DDD (degenerative disc disease), cervical    Depression    DVT (deep venous thrombosis) (HCC)    Dysphonia    Dyspnea    Fatty liver    Fatty liver    Headache    Hyperlipidemia    Hyperpiesia    Hypertension    Hypothyroidism    Interstitial cystitis    Left ventricular dysfunction    Lymphedema    Nephrolithiasis    Obstructive sleep apnea    Osteoarthritis    knees/cervical and lumbar spine   Pulmonary hypertension (HCC)    Pulmonary nodules    followed by Dr Theotis   Pure hypercholesterolemia    Renal cyst    right    Past Surgical History:  Procedure Laterality Date   ABDOMINAL HYSTERECTOMY      ovaries left in place   APPENDECTOMY     Back Surgeries     BACK SURGERY     BREAST REDUCTION SURGERY     3/99   CARDIAC CATHETERIZATION     cataracts Bilateral    CERVICAL SPINE SURGERY     ESOPHAGEAL MANOMETRY N/A 08/02/2015   Procedure: ESOPHAGEAL MANOMETRY (EM);  Surgeon: Donnice Vaughn Manes, MD;  Location: [facility] ENDOSCOPY;  Service: Endoscopy;  Laterality: N/A;   ESOPHAGOGASTRODUODENOSCOPY N/A 02/27/2015   Procedure: ESOPHAGOGASTRODUODENOSCOPY (EGD);  Surgeon: Deward CINDERELLA Piedmont, MD;  Location: [facility] ENDOSCOPY;  Service: Gastroenterology;  Laterality: N/A;   ESOPHAGOGASTRODUODENOSCOPY (EGD) WITH PROPOFOL  N/A 10/30/2018   Procedure: ESOPHAGOGASTRODUODENOSCOPY (EGD) WITH PROPOFOL ;  Surgeon: Gaylyn Gladis PENNER, MD;  Location: [facility] ENDOSCOPY;  Service: Endoscopy;  Laterality: N/A;   EXCISIONAL HEMORRHOIDECTOMY     EYE SURGERY     FRACTURE SURGERY     HEMORRHOID SURGERY     HIP SURGERY  2013   Right hip surgery   JOINT REPLACEMENT     KNEE ARTHROSCOPY     left and right   ORIF FEMUR FRACTURE Right 08/07/2018   Procedure: OPEN REDUCTION INTERNAL FIXATION (ORIF) DISTAL FEMUR FRACTURE;  Surgeon: Kathlynn Sharper, MD;  Location: [facility] ORS;  Service: Orthopedics;  Laterality: Right;   REDUCTION MAMMAPLASTY Bilateral YRS AGO   REPLACEMENT TOTAL KNEE Bilateral  rotator cuff surgery     blilateral   TONSILECTOMY/ADENOIDECTOMY WITH MYRINGOTOMY     VISCERAL ARTERY INTERVENTION N/A 02/25/2019   Procedure: VISCERAL ARTERY INTERVENTION;  Surgeon: Marea Selinda RAMAN, MD;  Location: [facility] INVASIVE CV LAB;  Service: Cardiovascular;  Laterality: N/A;    (Not in a hospital admission)  Social History   Socioeconomic History   Marital status: Widowed    Spouse name: Not on file   Number of children: 2   Years of education: 55   Highest education level: Associate degree: occupational, scientist, product/process development, or vocational program  Occupational History    Comment: engineer, site  Tobacco Use   Smoking status: Never    Smokeless tobacco: Never  Vaping Use   Vaping status: Never Used  Substance and Sexual Activity   Alcohol use: No    Alcohol/week: 0.0 standard drinks of alcohol   Drug use: Never   Sexual activity: Not Currently  Other Topics Concern   Not on file  Social History Narrative   Not on file   Social Drivers of Health   Financial Resource Strain: Low Risk  (09/16/2023)   Overall Financial Resource Strain (CARDIA)    Difficulty of Paying Living Expenses: Not very hard  Food Insecurity: No Food Insecurity (09/16/2023)   Hunger Vital Sign    Worried About Running Out of Food in the Last Year: Never true    Ran Out of Food in the Last Year: Never true  Transportation Needs: No Transportation Needs (09/16/2023)   PRAPARE - Administrator, Civil Service (Medical): No    Lack of Transportation (Non-Medical): No  Physical Activity: Inactive (09/16/2023)   Exercise Vital Sign    Days of Exercise per Week: 0 days    Minutes of Exercise per Session: 10 min  Stress: No Stress Concern Present (09/16/2023)   [employer] of Occupational Health - Occupational Stress Questionnaire    Feeling of Stress : Not at all  Social Connections: Socially Isolated (09/16/2023)   Social Connection and Isolation Panel [NHANES]    Frequency of Communication with Friends and Family: More than three times a week    Frequency of Social Gatherings with Friends and Family: More than three times a week    Attends Religious Services: Never    Database Administrator or Organizations: No    Attends Banker Meetings: Never    Marital Status: Widowed  Intimate Partner Violence: Not At Risk (11/25/2022)   Humiliation, Afraid, Rape, and Kick questionnaire    Fear of Current or Ex-Partner: No    Emotionally Abused: No    Physically Abused: No    Sexually Abused: No    Family History  Problem Relation Age of Onset   Heart disease Mother    Stroke Mother    Hypertension Mother    Heart  disease Father        myocardial infarction age 55   Breast cancer Neg Hx      Vitals:   10/10/23 0325 10/10/23 0400 10/10/23 0742 10/10/23 0749  BP: 113/67 136/79 (!) 147/72   Pulse: 77 72 74   Resp:  18 16   Temp: 97.8 F (36.6 C)   98.8 F (37.1 C)  TempSrc: Oral   Oral  SpO2:  95% 97%   Weight:      Height:        PHYSICAL EXAM General: Appearing elderly female, well nourished, in no acute distress. HEENT: Normocephalic and atraumatic. Neck:  No JVD.  Lungs: Normal respiratory effort on 2L [place]. Clear bilaterally to auscultation. No wheezes, crackles, rhonchi.  Heart: Irregularly irregular, elevated rate. Normal S1 and S2 without gallops or murmurs.  Abdomen: Non-distended appearing.  Msk: Normal strength and tone for age. Extremities: Warm and well perfused. No clubbing, cyanosis.  No edema.  Neuro: Alert and oriented X 3. Psych: Answers questions appropriately.   Labs: Basic Metabolic Panel: Recent Labs    10/09/23 1307 10/10/23 0751  NA 138 139  K 4.2 3.9  CL 103 100  CO2 26 29  GLUCOSE 128* 98  BUN 28* 24*  CREATININE 0.98 1.05*  CALCIUM  9.4 9.2  MG 2.0  --    Liver Function Tests: Recent Labs    10/09/23 1307  AST 22  ALT 17  ALKPHOS 83  BILITOT 0.7  PROT 6.9  ALBUMIN 3.8   No results for input(s): LIPASE, AMYLASE in the last 72 hours. CBC: Recent Labs    10/09/23 1307 10/10/23 0751  WBC 9.8 7.7  NEUTROABS 8.5*  --   HGB 11.1* 10.4*  HCT 34.1* 32.4*  MCV 103.0* 102.2*  PLT 231 213   Cardiac Enzymes: Recent Labs    10/09/23 1307 10/09/23 1800  TROPONINIHS 10 11   BNP: Recent Labs    10/09/23 1307  BNP 568.6*   D-Dimer: Recent Labs    10/09/23 1307  DDIMER 0.78*   Hemoglobin A1C: No results for input(s): HGBA1C in the last 72 hours. Fasting Lipid Panel: No results for input(s): CHOL, HDL, LDLCALC, TRIG, CHOLHDL, LDLDIRECT in the last 72 hours. Thyroid  Function Tests: Recent Labs    10/09/23 1307   TSH 0.468   Anemia Panel: No results for input(s): VITAMINB12, FOLATE, FERRITIN, TIBC, IRON , RETICCTPCT in the last 72 hours.   Radiology: [facility] Chest Port 1 View Result Date: 10/09/2023 CLINICAL DATA:  Shortness of breath. EXAM: PORTABLE CHEST 1 VIEW COMPARISON:  Chest radiograph dated 09/11/2022. FINDINGS: Shallow inspiration. Left lung base atelectasis. No pleural effusion pneumothorax. The cardiac silhouette. Atherosclerotic calcification of the aorta. No acute osseous pathology. IMPRESSION: Left lung base atelectasis. Electronically Signed   By: Vanetta Chou M.D.   On: 10/09/2023 14:41    ECHO ordered  TELEMETRY reviewed by me 10/10/2023: Atrial fibrillation rate 80-90s  EKG reviewed by me: atrial fibrillation RVR rate 136 bpm  Data reviewed by me 10/10/2023: last 24h vitals tele labs imaging I/O's progress note Principal Problem:   Atrial fibrillation with RVR (HCC)    ASSESSMENT AND PLAN:  Kimberly Roy is a 89 y.o. female  with a past medical history of chronic diastolic heart failure, hypertension, hyperlipidemia, pulmonary hypertension, sinus bradycardia, mild to moderate TR who presented to the ED on 10/09/2023 for elevated heart rate.  She was seen in her PCP office earlier today and there was concern that she was in atrial fibrillation and thus she was sent to the ED for further evaluation.  Cardiology was consulted for further evaluation.   # New onset atrial fibrillation # Atrial fibrillation RVR # Acute on chronic HFpEF Patient with history of chronic HFpEF, sinus bradycardia and frequent supraventricular ectopy on recent Holter monitor presenting from primary care office due to concern for atrial fibrillation RVR.  She had elevated heart rates in the 140s on admission and was started on IV diltiazem  for rate control.  Remains in atrial fibrillation this morning with controlled rate. -Echo ordered. -Transition to p.o. amiodarone  load today.  She has been off of  diltiazem  infusion since last night. -Continue Eliquis  2.5 mg twice daily, reduced dose due to age and concern for bleeding complications. -Continue Lasix  20 mg daily.  This patient's plan of care was discussed and created with Dr. Florencio and he is in agreement.  Signed: Danita Bloch, PA-C  10/10/2023, 9:58 AM [facility] Cardiology

## 2023-10-10 NOTE — Evaluation (Signed)
 Physical Therapy Evaluation Patient Details Name: Kimberly Roy MRN: [id number] DOB: August 22, 1931 Today's Date: 10/10/2023  History of Present Illness  Kimberly Roy is a 89 y.o. female with medical history significant of diastolic CHF, SVT on metoprolol  fatty liver, hyperlipidemia, hypertension, chronic lymphedema, obstructive sleep apnea, pulmonary hypertension presented to the emergency department for evaluation of fast irregular heartbeat from PCP office.  Patient denied chest pain, shortness of breath, palpitations.   Clinical Impression  Pt admitted with above diagnosis. Pt currently with functional limitations due to the deficits listed below (see PT Problem List). Pt received upright in bed agreeable to PT. Reports PTA having a PCA that assists Monday to Thursday with bathing/dressing and IADL's. Cousin assists on Fridays. She is typically independent with meals and toileting. Mod-I ambulating with rollator.   To date, pt resting HR 85-87 BPM and Spo2 90-98%. PT mod-I with bed mobility and bed features. STS with supervision to CGA to rollator completing ~50' of gait in room. Pt reports mild LE weakness and lightheadedness leading to seated rest but states overall feels close to her baseline. Max HR reaching 116 BPM. Trends down appropriately back to mid 80's with rest. Returns supine with all needs in reach with VSS. Educated pt and RN on importance of regular OOB mobility to maintain current functional capacity to prevent hospital related weakness. Pt will benefit from d/c recs to address acute LE weakness upon return to previous environment.      If plan is discharge home, recommend the following: A little help with bathing/dressing/bathroom;Assistance with cooking/housework;Assist for transportation;Help with stairs or ramp for entrance   Can travel by private vehicle        Equipment Recommendations None recommended by PT  Recommendations for Other Services       Functional Status  Assessment Patient has had a recent decline in their functional status and demonstrates the ability to make significant improvements in function in a reasonable and predictable amount of time.     Precautions / Restrictions Precautions Precautions: Fall Precaution Comments: monitor HR Restrictions Weight Bearing Restrictions Per Provider Order: No      Mobility  Bed Mobility Overal bed mobility: Modified Independent             General bed mobility comments: with bed features Patient Response: Cooperative  Transfers Overall transfer level: Needs assistance Equipment used: Rolling walker (2 wheels) Transfers: Sit to/from Stand Sit to Stand: Supervision                Ambulation/Gait Ambulation/Gait assistance: Contact guard assist Gait Distance (Feet): 50 Feet Assistive device: Rolling walker (2 wheels) Gait Pattern/deviations: Step-to pattern, Trunk flexed       General Gait Details: Has baseline limp due to leg length discrepancy.  Stairs            Wheelchair Mobility     Tilt Bed Tilt Bed Patient Response: Cooperative  Modified Rankin (Stroke Patients Only)       Balance Overall balance assessment: Needs assistance Sitting-balance support: No upper extremity supported, Feet supported Sitting balance-Leahy Scale: Good     Standing balance support: Bilateral upper extremity supported, No upper extremity supported Standing balance-Leahy Scale: Fair                               Pertinent Vitals/Pain Pain Assessment Pain Assessment: No/denies pain    Home Living Family/patient expects to be discharged to:: Private residence Living  Arrangements: Children Available Help at Discharge: Family;Personal care attendant;Available PRN/intermittently Type of Home: House Home Access: Ramped entrance       Home Layout: One level Home Equipment: Rollator (4 wheels);Shower seat;Grab bars - toilet;Grab bars - tub/shower;Hand held  shower head Additional Comments: Has PCA M-Thrs 9:30-3:30. Cousin helps on fridays but recently diagnosed with flu. Daughter is retired and can help if needed.    Prior Function Prior Level of Function : Needs assist       Physical Assist : Mobility (physical);ADLs (physical) Mobility (physical): Gait ADLs (physical): Grooming;Bathing;Dressing;IADLs Mobility Comments: Uses rollator in home and short community distances. ADLs Comments: PCA for ADL's and IADL's.     Extremity/Trunk Assessment   Upper Extremity Assessment Upper Extremity Assessment: Generalized weakness    Lower Extremity Assessment Lower Extremity Assessment: Generalized weakness    Cervical / Trunk Assessment Cervical / Trunk Assessment: Kyphotic  Communication   Communication Communication: No apparent difficulties Cueing Techniques: Verbal cues  Cognition Arousal: Alert Behavior During Therapy: WFL for tasks assessed/performed Overall Cognitive Status: Within Functional Limits for tasks assessed                                          General Comments General comments (skin integrity, edema, etc.): Resting HR: 85-87 in a-fib. Max HR post gait: 116 BPM    Exercises     Assessment/Plan    PT Assessment Patient needs continued PT services  PT Problem List Decreased strength;Decreased activity tolerance;Decreased balance;Decreased mobility       PT Treatment Interventions DME instruction;Neuromuscular re-education;Gait training;Stair training;Patient/family education;Functional mobility training;Therapeutic activities;Therapeutic exercise;Balance training    PT Goals (Current goals can be found in the Care Plan section)  Acute Rehab PT Goals Patient Stated Goal: return home PT Goal Formulation: With patient Time For Goal Achievement: 10/24/23 Potential to Achieve Goals: Good    Frequency Min 1X/week     Co-evaluation               AM-PAC PT 6 Clicks Mobility   Outcome Measure Help needed turning from your back to your side while in a flat bed without using bedrails?: A Little Help needed moving from lying on your back to sitting on the side of a flat bed without using bedrails?: A Little Help needed moving to and from a bed to a chair (including a wheelchair)?: A Little Help needed standing up from a chair using your arms (e.g., wheelchair or bedside chair)?: A Little Help needed to walk in hospital room?: A Little Help needed climbing 3-5 steps with a railing? : A Lot 6 Click Score: 17    End of Session Equipment Utilized During Treatment: Gait belt Activity Tolerance: Patient tolerated treatment well Patient left: in bed;with call bell/phone within reach;with bed alarm set;with family/visitor present Nurse Communication: Mobility status PT Visit Diagnosis: Other abnormalities of gait and mobility (R26.89);Muscle weakness (generalized) (M62.81)    Time: 8595-8569 PT Time Calculation (min) (ACUTE ONLY): 26 min   Charges:   PT Evaluation $PT Eval Moderate Complexity: 1 Mod PT Treatments $Therapeutic Activity: 8-22 mins PT General Charges $$ ACUTE PT VISIT: 1 Visit         Dorina HERO. Fairly IV, PT, DPT Physical Therapist- [place]  [facility]  10/10/2023, 3:27 PM

## 2023-10-11 ENCOUNTER — Inpatient Hospital Stay
Admit: 2023-10-11 | Discharge: 2023-10-11 | Disposition: A | Discharge status: Home / Self Care | Payer: Medicare Other | Attending: Student | Admitting: Student

## 2023-10-11 DIAGNOSIS — I4891 Unspecified atrial fibrillation: Secondary | ICD-10-CM | POA: Diagnosis not present

## 2023-10-11 DIAGNOSIS — I471 Supraventricular tachycardia, unspecified: Secondary | ICD-10-CM | POA: Diagnosis not present

## 2023-10-11 DIAGNOSIS — E66811 Obesity, class 1: Secondary | ICD-10-CM | POA: Diagnosis not present

## 2023-10-11 DIAGNOSIS — I5033 Acute on chronic diastolic (congestive) heart failure: Secondary | ICD-10-CM | POA: Diagnosis not present

## 2023-10-11 LAB — BASIC METABOLIC PANEL
Anion gap: 10 (ref 5–15)
BUN: 28 mg/dL — ABNORMAL HIGH (ref 8–23)
CO2: 27 mmol/L (ref 22–32)
Calcium: 9.2 mg/dL (ref 8.9–10.3)
Chloride: 100 mmol/L (ref 98–111)
Creatinine, Ser: 0.99 mg/dL (ref 0.44–1.00)
GFR, Estimated: 54 mL/min — ABNORMAL LOW (ref >60)
Glucose, Bld: 109 mg/dL — ABNORMAL HIGH (ref 70–99)
Potassium: 4.3 mmol/L (ref 3.5–5.1)
Sodium: 137 mmol/L (ref 135–145)

## 2023-10-11 LAB — CBC
HCT: 34.1 % — ABNORMAL LOW (ref 36.0–46.0)
Hemoglobin: 11.4 g/dL — ABNORMAL LOW (ref 12.0–15.0)
MCH: 34 pg (ref 26.0–34.0)
MCHC: 33.4 g/dL (ref 30.0–36.0)
MCV: 101.8 fL — ABNORMAL HIGH (ref 80.0–100.0)
Platelets: 212 10*3/uL (ref 150–400)
RBC: 3.35 MIL/uL — ABNORMAL LOW (ref 3.87–5.11)
RDW: 13.5 % (ref 11.5–15.5)
WBC: 9.5 10*3/uL (ref 4.0–10.5)
nRBC: 0 % (ref 0.0–0.2)

## 2023-10-11 LAB — ECHOCARDIOGRAM COMPLETE
Height: 62 in
S' Lateral: 3.3 cm
Weight: 2783.09 [oz_av]

## 2023-10-11 NOTE — Progress Notes (Signed)
 Progress Note   Patient: Kimberly Roy FMW:[id number] DOB: Mar 27, 1931 DOA: 10/09/2023     2 DOS: the patient was seen and examined on 10/11/2023   Brief hospital course: Kimberly Roy is a 89 y.o. female with medical history significant of diastolic CHF, SVT on metoprolol  fatty liver, hyperlipidemia, hypertension, chronic lymphedema, obstructive sleep apnea, pulmonary hypertension presented to the emergency department for evaluation of fast irregular heartbeat from PCP office.  Patient denied chest pain, shortness of breath, palpitations.  Patient is admitted for new onset A-fib, RVR.  Cardiology evaluated her and recommended amiodarone  bolus and drip.  Assessment and Plan: A-fib with RVR: New onset A-fib. Heart rate better control today. IV amiodarone  drip to be changed to oral amio.   Off Cardizem  drip Continue metoprolol  therapy. Cardiology follow-up appreciated.   Continue Eliquis  2.5 mg twice daily due to her age, bleeding risk. Hold aspirin .  Echocardiogram pending. Continue telemetry monitoring.   Acute on chronic chronic diastolic CHF: BNP elevated 568,chest xray no pulmonary edema. Troponin flat. Got one-time IV Lasix  40 mg yesterday, resumed p.o. 20mg  from tomorrow. Monitor daily weights, strict input and output. Monitor electrolytes, renal function.   Chronic back pain Continue home regimen pain medications morphine , gabapentin . Continue prednisone  taper. She follows orthopedics for shots.   Hypertension Continue beta blocker. Resumed benazepril .   Hyperlipidemia Continue lipitor   Obesity class I BMI 31.81 Diet, exercise and weight reduction advised.   Hypothyroidism TSH, free T4 WNL. Continue home dose synthroid .   Anxiety and depression: Continue Cymbalta    GERD- On protonix . Maalox PRN.  Out of bed to chair. Incentive spirometry. Nursing supportive care. Fall, aspiration precautions. DVT prophylaxis   Code Status: Full Code  Subjective: Patient is seen  and examined today morning. She is lying in bed. Worked with PT yesterday. Denies chest pain, palpitations. HR well controlled. Awaiting Echo.  Physical Exam: Vitals:   10/11/23 0454 10/11/23 0852 10/11/23 1311 10/11/23 1608  BP: (!) 154/88 (!) 153/66 133/78 (!) 157/73  Pulse: 84 93 95 74  Resp: 18     Temp: 98.8 F (37.1 C) 98 F (36.7 C) 98.2 F (36.8 C) 98.7 F (37.1 C)  TempSrc: Oral   Oral  SpO2: 91% 94% 95% 91%  Weight:      Height:        General - Elderly obese Caucasian female, no apparent distress HEENT - PERRLA, EOMI, atraumatic head, non tender sinuses. Lung - Clear, basal rales, no rhonchi, wheezes. Heart - S1, S2 heard, no murmurs, rubs, trace pedal edema. Abdomen - Soft, non tender, bowel sounds good Neuro - Alert, awake and oriented x 3, non focal exam. Skin - Warm and dry.  Data Reviewed:      Latest Ref Rng & Units 10/11/2023    3:28 AM 10/10/2023    7:51 AM 10/09/2023    1:07 PM  CBC  WBC 4.0 - 10.5 K/uL 9.5  7.7  9.8   Hemoglobin 12.0 - 15.0 g/dL 88.5  89.5  88.8   Hematocrit 36.0 - 46.0 % 34.1  32.4  34.1   Platelets 150 - 400 K/uL 212  213  231       Latest Ref Rng & Units 10/11/2023    3:28 AM 10/10/2023    7:51 AM 10/09/2023    1:07 PM  BMP  Glucose 70 - 99 mg/dL 890  98  871   BUN 8 - 23 mg/dL 28  24  28    Creatinine 0.44 -  1.00 mg/dL 9.00  8.94  9.01   Sodium 135 - 145 mmol/L 137  139  138   Potassium 3.5 - 5.1 mmol/L 4.3  3.9  4.2   Chloride 98 - 111 mmol/L 100  100  103   CO2 22 - 32 mmol/L 27  29  26    Calcium  8.9 - 10.3 mg/dL 9.2  9.2  9.4    ECHOCARDIOGRAM COMPLETE Result Date: 10/11/2023    ECHOCARDIOGRAM REPORT   Patient Name:   [facility] Date of Exam: 10/11/2023 Medical Rec #:  [id number]   Height:       62.0 in Accession #:    [id number]  Weight:       173.9 lb Date of Birth:  [birth year]    BSA:          1.802 m Patient Age:    89 years    BP:           154/88 mmHg Patient Gender: F           HR:           80 bpm. Exam Location:  [facility]  Procedure: 2D Echo Indications:     Atrial Fibrillation I48.91  History:         Patient has prior history of Echocardiogram examinations, most                  recent 07/14/2022.  Sonographer:     Thedora Louder RDCS, FASE Referring Phys:  [id number] Kimberly Roy Diagnosing Phys: Kimberly Kimberly Lovelace MD  Sonographer Comments: Technically challenging study due to limited acoustic windows, no apical window and suboptimal subcostal window. Image acquisition challenging due to patient body habitus and Image acquisition challenging due to respiratory motion. IMPRESSIONS  1. TDS.  2. Left ventricular ejection fraction, by estimation, is 55 to 60%. The left ventricle has normal function. The left ventricle has no regional wall motion abnormalities. Left ventricular diastolic function could not be evaluated.  3. Right ventricular systolic function is normal. The right ventricular size is normal.  4. Left atrial size was mild to moderately dilated.  5. Right atrial size was mildly dilated.  6. The mitral valve is normal in structure. No evidence of mitral valve regurgitation.  7. The aortic valve is grossly normal. Aortic valve regurgitation is not visualized. Conclusion(s)/Recommendation(s): Poor windows for evaluation of left ventricular function by transthoracic echocardiography. Would recommend an alternative means of evaluation. FINDINGS  Left Ventricle: Left ventricular ejection fraction, by estimation, is 55 to 60%. The left ventricle has normal function. The left ventricle has no regional wall motion abnormalities. The left ventricular internal cavity size was normal in size. There is  borderline left ventricular hypertrophy. Left ventricular diastolic function could not be evaluated. Right Ventricle: The right ventricular size is normal. No increase in right ventricular wall thickness. Right ventricular systolic function is normal. Left Atrium: Left atrial size was mild to moderately dilated. Right Atrium: Right  atrial size was mildly dilated. Pericardium: There is no evidence of pericardial effusion. Mitral Valve: The mitral valve is normal in structure. No evidence of mitral valve regurgitation. Tricuspid Valve: The tricuspid valve is normal in structure. Tricuspid valve regurgitation is not demonstrated. Aortic Valve: The aortic valve is grossly normal. Aortic valve regurgitation is not visualized. Pulmonic Valve: The pulmonic valve was normal in structure. Pulmonic valve regurgitation is not visualized. Aorta: The ascending aorta was not well visualized. IAS/Shunts: No atrial level shunt detected by color flow  Doppler. Additional Comments: TDS.  LEFT VENTRICLE PLAX 2D LVIDd:         4.80 cm LVIDs:         3.30 cm LV PW:         1.20 cm LV IVS:        1.00 cm LVOT diam:     1.80 cm LVOT Area:     2.54 cm  LEFT ATRIUM         Index LA diam:    4.90 cm 2.72 cm/m                        PULMONIC VALVE AORTA                 PV Vmax:        0.93 m/s Ao Root diam: 3.10 cm PV Peak grad:   3.5 mmHg                       RVOT Peak grad: 4 mmHg  TRICUSPID VALVE TR Peak grad:   24.8 mmHg TR Vmax:        249.00 cm/s  SHUNTS Systemic Diam: 1.80 cm Dwayne Kimberly Lovelace MD Electronically signed by Kimberly Kimberly Lovelace MD Signature Date/Time: 10/11/2023/2:28:12 PM    Final    Family Communication: Discussed with patient, she understand and agree. All questions answereed.  Disposition: Status is: Inpatient Remains inpatient appropriate because: awaiting Echo, rate and rhythm control medications.  Planned Discharge Destination: Home with Home Health     Time spent: 37 minutes  Author: Concepcion Riser, MD 10/11/2023 4:25 PM Secure chat 7am to 7pm For on call review [email or web address].

## 2023-10-11 NOTE — Plan of Care (Signed)
  Problem: Education: Goal: Knowledge of General Education information will improve Description: Including pain rating scale, medication(s)/side effects and non-pharmacologic comfort measures Outcome: Progressing   Problem: Health Behavior/Discharge Planning: Goal: Ability to manage health-related needs will improve Outcome: Progressing   Problem: Clinical Measurements: Goal: Ability to maintain clinical measurements within normal limits will improve Outcome: Progressing Goal: Will remain free from infection Outcome: Progressing Goal: Diagnostic test results will improve Outcome: Progressing Goal: Respiratory complications will improve Outcome: Progressing Goal: Cardiovascular complication will be avoided Outcome: Progressing   Problem: Nutrition: Goal: Adequate nutrition will be maintained Outcome: Progressing   Problem: Activity: Goal: Risk for activity intolerance will decrease Outcome: Progressing   Problem: Coping: Goal: Level of anxiety will decrease Outcome: Progressing   Problem: Elimination: Goal: Will not experience complications related to bowel motility Outcome: Progressing Goal: Will not experience complications related to urinary retention Outcome: Progressing   Problem: Pain Managment: Goal: General experience of comfort will improve and/or be controlled Outcome: Progressing   Problem: Safety: Goal: Ability to remain free from injury will improve Outcome: Progressing   Problem: Skin Integrity: Goal: Risk for impaired skin integrity will decrease Outcome: Progressing   Problem: Education: Goal: Knowledge of disease or condition will improve Outcome: Progressing Goal: Understanding of medication regimen will improve Outcome: Progressing Goal: Individualized Educational Video(s) Outcome: Progressing   Problem: Activity: Goal: Ability to tolerate increased activity will improve Outcome: Progressing   Problem: Cardiac: Goal: Ability to achieve  and maintain adequate cardiopulmonary perfusion will improve Outcome: Progressing   Problem: Health Behavior/Discharge Planning: Goal: Ability to safely manage health-related needs after discharge will improve Outcome: Progressing

## 2023-10-11 NOTE — Progress Notes (Signed)
 [facility] Cardiology    SUBJECTIVE: Patient resting comfortably in bed shortness of breath improved denies any palpitations or tachycardia or lower extremity edema   Vitals:   10/10/23 2021 10/10/23 2345 10/11/23 0454 10/11/23 0852  BP: 129/79 (!) 159/66 (!) 154/88 (!) 153/66  Pulse: (!) 101 90 84 93  Resp: 18 18 18    Temp: 98.8 F (37.1 C) 98 F (36.7 C) 98.8 F (37.1 C) 98 F (36.7 C)  TempSrc: Oral Oral Oral   SpO2: 98% 91% 91% 94%  Weight:      Height:         Intake/Output Summary (Last 24 hours) at 10/11/2023 1040 Last data filed at 10/10/2023 1700 Gross per 24 hour  Intake 398.62 ml  Output 900 ml  Net -501.38 ml      PHYSICAL EXAM  General: Well developed, well nourished, in no acute distress HEENT:  Normocephalic and atramatic Neck:  No JVD.  Lungs: Clear bilaterally to auscultation and percussion. Heart: Irregular irregular. Normal S1 and S2 without gallops or murmurs.  Abdomen: Bowel sounds are positive, abdomen soft and non-tender  Msk:  Back normal, normal gait. Normal strength and tone for age. Extremities: No clubbing, cyanosis or 2+edema.   Neuro: Alert and oriented X 3. Psych:  Good affect, responds appropriately   LABS: Basic Metabolic Panel: Recent Labs    10/09/23 1307 10/10/23 0751 10/11/23 0328  NA 138 139 137  K 4.2 3.9 4.3  CL 103 100 100  CO2 26 29 27   GLUCOSE 128* 98 109*  BUN 28* 24* 28*  CREATININE 0.98 1.05* 0.99  CALCIUM  9.4 9.2 9.2  MG 2.0  --   --    Liver Function Tests: Recent Labs    10/09/23 1307  AST 22  ALT 17  ALKPHOS 83  BILITOT 0.7  PROT 6.9  ALBUMIN 3.8   No results for input(s): LIPASE, AMYLASE in the last 72 hours. CBC: Recent Labs    10/09/23 1307 10/10/23 0751 10/11/23 0328  WBC 9.8 7.7 9.5  NEUTROABS 8.5*  --   --   HGB 11.1* 10.4* 11.4*  HCT 34.1* 32.4* 34.1*  MCV 103.0* 102.2* 101.8*  PLT 231 213 212   Cardiac Enzymes: No results for input(s): CKTOTAL, CKMB, CKMBINDEX,  TROPONINI in the last 72 hours. BNP: Invalid input(s): POCBNP D-Dimer: Recent Labs    10/09/23 1307  DDIMER 0.78*   Hemoglobin A1C: No results for input(s): HGBA1C in the last 72 hours. Fasting Lipid Panel: No results for input(s): CHOL, HDL, LDLCALC, TRIG, CHOLHDL, LDLDIRECT in the last 72 hours. Thyroid  Function Tests: Recent Labs    10/09/23 1307  TSH 0.468   Anemia Panel: No results for input(s): VITAMINB12, FOLATE, FERRITIN, TIBC, IRON , RETICCTPCT in the last 72 hours.  DG Chest Port 1 View Result Date: 10/09/2023 CLINICAL DATA:  Shortness of breath. EXAM: PORTABLE CHEST 1 VIEW COMPARISON:  Chest radiograph dated 09/11/2022. FINDINGS: Shallow inspiration. Left lung base atelectasis. No pleural effusion pneumothorax. The cardiac silhouette. Atherosclerotic calcification of the aorta. No acute osseous pathology. IMPRESSION: Left lung base atelectasis. Electronically Signed   By: Vanetta Chou M.D.   On: 10/09/2023 14:41     Echo pending  TELEMETRY: Atrial fibrillation rate of around 70 nonspecific ST-T wave changes:  ASSESSMENT AND PLAN:  Principal Problem:   Atrial fibrillation with RVR (HCC) Chronic diastolic congestive heart failure Tachycardia Pretension Obesity  Plan Atrial fibrillation management rate control metoprolol  rhythm control with amiodarone  Eliquis  for anticoagulation Acute on  chronic heart failure preserved left ventricular function continue current therapy diuretics blood pressure control Hypertension metoprolol  diltiazem  Lasix  Heart failure continue diuretic therapy as necessary No invasive procedures recommended   Cara JONETTA Lovelace, MD 10/11/2023 10:40 AM

## 2023-10-11 NOTE — Progress Notes (Signed)
  Echocardiogram 2D Echocardiogram has been performed.  Nichola Barges 10/11/2023, 1:08 PM

## 2023-10-11 NOTE — Plan of Care (Signed)

## 2023-10-12 DIAGNOSIS — E039 Hypothyroidism, unspecified: Secondary | ICD-10-CM

## 2023-10-12 DIAGNOSIS — I10 Essential (primary) hypertension: Secondary | ICD-10-CM | POA: Diagnosis not present

## 2023-10-12 DIAGNOSIS — I890 Lymphedema, not elsewhere classified: Secondary | ICD-10-CM

## 2023-10-12 DIAGNOSIS — I5033 Acute on chronic diastolic (congestive) heart failure: Secondary | ICD-10-CM | POA: Diagnosis not present

## 2023-10-12 DIAGNOSIS — E66811 Obesity, class 1: Secondary | ICD-10-CM | POA: Insufficient documentation

## 2023-10-12 DIAGNOSIS — I272 Pulmonary hypertension, unspecified: Secondary | ICD-10-CM

## 2023-10-12 DIAGNOSIS — I4891 Unspecified atrial fibrillation: Secondary | ICD-10-CM | POA: Diagnosis not present

## 2023-10-12 MED ORDER — AMIODARONE HCL 200 MG PO TABS: 200.0000 mg | ORAL_TABLET | Freq: Every day | ORAL | 2 refills | Status: DC

## 2023-10-12 MED ORDER — AMIODARONE HCL 200 MG PO TABS: 200.0000 mg | ORAL_TABLET | Freq: Every day | ORAL | Status: DC

## 2023-10-12 MED ORDER — APIXABAN 2.5 MG PO TABS: 2.5000 mg | ORAL_TABLET | Freq: Two times a day (BID) | ORAL | 3 refills | Status: DC

## 2023-10-12 NOTE — Discharge Summary (Signed)
 Physician Discharge Summary   Patient: Kimberly Roy MRN: [id number] DOB: [birth year]  Admit date:     10/09/2023  Discharge date: 10/12/23  Discharge Physician: Concepcion Riser   PCP: Glendia Shad, MD   Recommendations at discharge:    PCP follow up in 1 week. [facility] follow up as scheduled.  Discharge Diagnoses: Principal Problem:   Atrial fibrillation with RVR (HCC) Active Problems:   Acute on chronic diastolic CHF (congestive heart failure) (HCC)   Pulmonary hypertension (HCC)   Hypertension   Hyperlipidemia   Hypothyroidism   Lymphedema   Obesity (BMI 30.0-34.9)  Resolved Problems:   * No resolved hospital problems. [facility] Course: Kimberly Roy is a 89 y.o. female with medical history significant of diastolic CHF, SVT on metoprolol  fatty liver, hyperlipidemia, hypertension, chronic lymphedema, obstructive sleep apnea, pulmonary hypertension presented to the emergency department for evaluation of fast irregular heartbeat from PCP office.  Patient denied chest pain, shortness of breath, palpitations.    Patient is admitted for new onset A-fib, RVR.  Cardiology evaluated her and recommended amiodarone  bolus and drip, IV lasix   Assessment and Plan: A-fib with RVR: New onset A-fib. Heart rate better control. Cardiology transitioned IV amiodarone  drip to oral amio.   She is off Cardizem  drip. Continue metoprolol  therapy.   Continue Eliquis  2.5 mg twice daily due to her age, bleeding risk.  Hold aspirin .  Echocardiogram reviewed EF 55-60%, no wall motion abnormalities. [facility] cardiology follow up as suggested.   Acute on chronic chronic diastolic CHF: BNP elevated 568,chest xray no pulmonary edema. Troponin flat. Got one-time IV Lasix  40 mg while in ED, resumed  home dose p.o. 20mg . Advised to monitor daily weights, salt and fluid restriction.   Chronic back pain Continue home regimen pain medications morphine , gabapentin . Continue prednisone  taper as  prescribed outpatient. Continue to follow orthopedics.   Hypertension Continue beta blocker. Resumed Norvasc , benazepril .   Hyperlipidemia Continue lipitor   Obesity class I BMI 31.81 Diet, exercise and weight reduction advised.   Hypothyroidism TSH, free T4 WNL. Continue home dose synthroid .   Anxiety and depression: Continue Cymbalta    GERD- On protonix . Maalox PRN.        Consultants: Cardiology Procedures performed: none  Disposition: Home Diet recommendation:  Discharge Diet Orders (From admission, onward)     Start     Ordered   10/12/23 0000  Diet - low sodium heart healthy        10/12/23 1049           Cardiac diet DISCHARGE MEDICATION: Allergies as of 10/12/2023       Reactions   Lyrica [pregabalin] Swelling   Omnicef  [cefdinir ] Diarrhea, Nausea And Vomiting   Atarax [hydroxyzine]    jittery   Dicyclomine Other (See Comments)   Abdominal bloating   Hydroxyzine Hcl    jittery   Levaquin [levofloxacin] Swelling   Nitrofurantoin  Diarrhea   Nucynta Er [tapentadol Hcl Er] Other (See Comments)   Severe constipation   Oxybutynin Other (See Comments)   Blurred vision   Zoloft [sertraline Hcl]    Severe headache   Biaxin [clarithromycin] Other (See Comments), Rash   Pt does not remember Pt does not remember   Meloxicam Rash   Other Rash   Other Reaction(s): Other (See Comments) Durabond - redness, Durabond - redness   Sertraline Nausea And Vomiting   Severe headache Other reaction(s): Headache   Silicone Other (See Comments), Rash   Durabond - redness Durabond -  redness    Durabond - redness   Sulfa Antibiotics Rash   Pt does not remember   Sulfasalazine Rash   Pt does not remember   Tape Rash   Durabond - redness   Tapentadol Other (See Comments), Rash   Severe constipation Severe constipation Severe constipation Severe constipation Severe constipation   Venofer  [iron  Sucrose] Rash        Medication List     STOP  taking these medications    aspirin  EC 81 MG tablet       TAKE these medications    albuterol  108 (90 Base) MCG/ACT inhaler Commonly known as: VENTOLIN  HFA INHALE 2 PUFFS INTO THE LUNGS EVERY 6 HOURS AS NEEDED FOR WHEEZING OR SHORTNESS OF BREATH   aluminum-magnesium  hydroxide-simethicone  200-200-20 MG/5ML Susp Commonly known as: MAALOX Take 15 mLs by mouth 4 (four) times daily -  before meals and at bedtime.   amiodarone  200 MG tablet Commonly known as: PACERONE  Take 1 tablet (200 mg total) by mouth daily.   amLODipine  10 MG tablet Commonly known as: NORVASC  Take 1 tablet (10 mg total) by mouth daily.   apixaban  2.5 MG Tabs tablet Commonly known as: ELIQUIS  Take 1 tablet (2.5 mg total) by mouth 2 (two) times daily.   atorvastatin  20 MG tablet Commonly known as: LIPITOR TAKE 1 TABLET BY MOUTH AT BEDTIME   benazepril  40 MG tablet Commonly known as: LOTENSIN  Take 1 tablet (40 mg total) by mouth daily.   betamethasone  dipropionate 0.05 % cream Apply 1 application topically 2 (two) times daily.   budesonide -formoterol  80-4.5 MCG/ACT inhaler Commonly known as: Symbicort  Inhale 2 puffs into the lungs in the morning and at bedtime.   busPIRone  10 MG tablet Commonly known as: BUSPAR  Take 10 mg by mouth 3 (three) times daily.   calcium  citrate-vitamin D  315-200 MG-UNIT tablet Commonly known as: CITRACAL+D Take 2 tablets by mouth daily.   clobetasol  cream 0.05 % Commonly known as: TEMOVATE  Apply 1 application topically See admin instructions. Apply to affected areas of body 1 - 2 times daily as needed for itchy bumps. Avoid applying to face, groin, and axilla. Use as directed.   cyanocobalamin  1000 MCG/ML injection Commonly known as: VITAMIN B12 INJECT 1 ML INTO THE MUSCLE EVERY 3 MONTHS.   Dermacloud Oint Apply topically as directed (as discussed).   Diclofenac  Sodium 3 % Gel Apply topically 2 (two) times daily.   DSS 100 MG Caps Take 100 mg by mouth 2 (two)  times daily.   DULoxetine  60 MG capsule Commonly known as: CYMBALTA  Take 60 mg by mouth daily.   DULoxetine  20 MG capsule Commonly known as: CYMBALTA  Take 20 mg by mouth daily.   Fluocinolone  Acetonide 0.01 % Oil Apply 1-2 drops into ears once to twice daily as needed.   fluocinonide  0.05 % external solution Commonly known as: LIDEX  Apply topically 2 (two) times daily. Apply drops to scalp for itch   fluticasone  50 MCG/ACT nasal spray Commonly known as: FLONASE  Place 2 sprays into both nostrils daily.   furosemide  20 MG tablet Commonly known as: LASIX  TAKE 1 TABLET BY MOUTH DAILY AS NEEDED What changed: when to take this   gabapentin  300 MG capsule Commonly known as: NEURONTIN  TAKE 2 CAPSULES BY MOUTH 3 TIMES A DAY (IN THE MORNING, MIDDAY AND AT BEDTIME).   GAS-X PO Take 2 tablets by mouth 2 (two) times daily.   HYDROcodone -acetaminophen  10-325 MG tablet Commonly known as: Norco Take 1 tablet by mouth 3 (three) times  daily.   ipratropium 0.03 % nasal spray Commonly known as: ATROVENT  Place 2 sprays into both nostrils every 12 (twelve) hours.   ketoconazole  2 % shampoo Commonly known as: NIZORAL  Apply 1 Application topically See admin instructions. apply three times per week, massage into scalp and leave in for 10 minutes before rinsing out   levalbuterol  0.63 MG/3ML nebulizer solution Commonly known as: Xopenex  Take 3 mLs (0.63 mg total) by nebulization every 6 (six) hours as needed for wheezing or shortness of breath.   Linzess  290 MCG Caps capsule Generic drug: linaclotide  Take 290 mcg by mouth daily.   magnesium  oxide 400 MG tablet Commonly known as: MAG-OX Take 1 tablet (400 mg total) by mouth daily.   metoprolol  tartrate 25 MG tablet Commonly known as: LOPRESSOR  Take 25 mg by mouth daily.   morphine  30 MG 12 hr tablet Commonly known as: MS CONTIN  Take 30 mg by mouth daily. 1400   morphine  60 MG 12 hr tablet Commonly known as: MS CONTIN  Take 1  tablet (60 mg total) by mouth 2 (two) times daily.   mupirocin  ointment 2 % Commonly known as: BACTROBAN  Apply 1 Application topically daily. Qd to excision site   nystatin  powder Commonly known as: MYCOSTATIN /NYSTOP  Apply 1 application. topically 2 (two) times daily as needed.   nystatin  cream Commonly known as: MYCOSTATIN  Apply 1 Application topically 2 (two) times daily.   pantoprazole  40 MG tablet Commonly known as: PROTONIX  Take 1 tablet (40 mg total) by mouth 2 (two) times daily.   polyethylene glycol powder 17 GM/SCOOP powder Commonly known as: GLYCOLAX /MIRALAX  MIX 17 GRAMS AS MARKED ON BOTTLE TOP IN 8 OUNCES OF WATER AND DRINK ONCE A DAY AS DIRECTED.   predniSONE  10 MG tablet Commonly known as: DELTASONE  Taper dose 60mg x2 days/50mg x2 days/40mg x2 days/30mg x2 days/20mg x2 days/10mg x2 days, stop Pt started 10/07/23   sodium chloride  0.65 % nasal spray Commonly known as: OCEAN Place 1 spray into the nose as needed.   Synthroid  100 MCG tablet Generic drug: levothyroxine  TAKE 1 TABLET BY MOUTH DAILY   triamcinolone  ointment 0.1 % Commonly known as: KENALOG  APPLY TWICE DAILY TO BITES AND RASH UNTIL FLAT AND SMOOTH **DO NOT APPLY TO FACE**   Vitamin D3 25 MCG (1000 UT) Caps Take 1 capsule by mouth daily.        Follow-up Information     Launie Maiden, Ronal Maxwell, NP. Go in 1 week(s).   Specialty: Nurse Practitioner Contact information: [address] [place] [address] [phone]         Ammon Blunt, MD Follow up in 3 week(s).   Specialty: Cardiology Contact information: [address] [facility] [place] [address] [phone]                Discharge Exam: Fredricka Weights   10/09/23 1317  Weight: 78.9 kg      10/12/2023    3:43 AM 10/11/2023   11:59 PM 10/11/2023    8:40 PM  Vitals with BMI  Systolic 159 139 856  Diastolic 112 66 81  Pulse 83 73 90   General - Elderly obese Caucasian  female, no apparent distress HEENT - PERRLA, EOMI, atraumatic head, non tender sinuses. Lung - Clear, basal rales, no rhonchi, wheezes. Heart - S1, S2 heard, no murmurs, rubs, trace pedal edema. Abdomen - Soft, non tender, bowel sounds good Neuro - Alert, awake and oriented x 3, non focal exam. Skin - Warm and dry.  Condition at discharge: stable  The results of significant diagnostics from this hospitalization (including imaging, microbiology, ancillary and laboratory) are listed below for reference.   Imaging Studies: ECHOCARDIOGRAM COMPLETE Result Date: 10/11/2023    ECHOCARDIOGRAM REPORT   Patient Name:   [facility] Date of Exam: 10/11/2023 Medical Rec #:  [id number]   Height:       62.0 in Accession #:    [id number]  Weight:       173.9 lb Date of Birth:  [birth year]    BSA:          1.802 m Patient Age:    89 years    BP:           154/88 mmHg Patient Gender: F           HR:           80 bpm. Exam Location:  [facility] Procedure: 2D Echo Indications:     Atrial Fibrillation I48.91  History:         Patient has prior history of Echocardiogram examinations, most                  recent 07/14/2022.  Sonographer:     Thedora Louder RDCS, FASE Referring Phys:  [id number] CARALYN HUDSON Diagnosing Phys: Cara JONETTA Lovelace MD  Sonographer Comments: Technically challenging study due to limited acoustic windows, no apical window and suboptimal subcostal window. Image acquisition challenging due to patient body habitus and Image acquisition challenging due to respiratory motion. IMPRESSIONS  1. TDS.  2. Left ventricular ejection fraction, by estimation, is 55 to 60%. The left ventricle has normal function. The left ventricle has no regional wall motion abnormalities. Left ventricular diastolic function could not be evaluated.  3. Right ventricular systolic function is normal. The right ventricular size is normal.  4. Left atrial size was mild to moderately dilated.  5. Right atrial size was mildly dilated.  6. The  mitral valve is normal in structure. No evidence of mitral valve regurgitation.  7. The aortic valve is grossly normal. Aortic valve regurgitation is not visualized. Conclusion(s)/Recommendation(s): Poor windows for evaluation of left ventricular function by transthoracic echocardiography. Would recommend an alternative means of evaluation. FINDINGS  Left Ventricle: Left ventricular ejection fraction, by estimation, is 55 to 60%. The left ventricle has normal function. The left ventricle has no regional wall motion abnormalities. The left ventricular internal cavity size was normal in size. There is  borderline left ventricular hypertrophy. Left ventricular diastolic function could not be evaluated. Right Ventricle: The right ventricular size is normal. No increase in right ventricular wall thickness. Right ventricular systolic function is normal. Left Atrium: Left atrial size was mild to moderately dilated. Right Atrium: Right atrial size was mildly dilated. Pericardium: There is no evidence of pericardial effusion. Mitral Valve: The mitral valve is normal in structure. No evidence of mitral valve regurgitation. Tricuspid Valve: The tricuspid valve is normal in structure. Tricuspid valve regurgitation is not demonstrated. Aortic Valve: The aortic valve is grossly normal. Aortic valve regurgitation is not visualized. Pulmonic Valve: The pulmonic valve was normal in structure. Pulmonic valve regurgitation is not visualized. Aorta: The ascending aorta was not well visualized. IAS/Shunts: No atrial level shunt detected by color flow Doppler. Additional Comments: TDS.  LEFT VENTRICLE PLAX 2D LVIDd:         4.80 cm LVIDs:         3.30 cm LV PW:         1.20 cm LV IVS:  1.00 cm LVOT diam:     1.80 cm LVOT Area:     2.54 cm  LEFT ATRIUM         Index LA diam:    4.90 cm 2.72 cm/m                        PULMONIC VALVE AORTA                 PV Vmax:        0.93 m/s Ao Root diam: 3.10 cm PV Peak grad:   3.5 mmHg                        RVOT Peak grad: 4 mmHg  TRICUSPID VALVE TR Peak grad:   24.8 mmHg TR Vmax:        249.00 cm/s  SHUNTS Systemic Diam: 1.80 cm Cara JONETTA Lovelace MD Electronically signed by Cara JONETTA Lovelace MD Signature Date/Time: 10/11/2023/2:28:12 PM    Final    DG Chest Port 1 View Result Date: 10/09/2023 CLINICAL DATA:  Shortness of breath. EXAM: PORTABLE CHEST 1 VIEW COMPARISON:  Chest radiograph dated 09/11/2022. FINDINGS: Shallow inspiration. Left lung base atelectasis. No pleural effusion pneumothorax. The cardiac silhouette. Atherosclerotic calcification of the aorta. No acute osseous pathology. IMPRESSION: Left lung base atelectasis. Electronically Signed   By: Vanetta Chou M.D.   On: 10/09/2023 14:41    Microbiology: Results for orders placed or performed during the hospital encounter of 06/30/23  Urine Culture     Status: Abnormal   Collection Time: 06/30/23  2:25 PM   Specimen: Urine, Clean Catch  Result Value Ref Range Status   Specimen Description   Final    URINE, CLEAN CATCH Performed at [facility], [address], [place], [place] [address]    Special Requests   Final    URINE, CLEAN CATCH Performed at [facility], [address], [place], [place] [address]    Culture (A)  Final    60,000 COLONIES/mL MULTIPLE SPECIES PRESENT, SUGGEST RECOLLECTION   Report Status 07/01/2023 FINAL  Final   *Note: Due to a large number of results and/or encounters for the requested time period, some results have not been displayed. A complete set of results can be found in Results Review.    Labs: CBC: Recent Labs  Lab 10/09/23 1307 10/10/23 0751 10/11/23 0328  WBC 9.8 7.7 9.5  NEUTROABS 8.5*  --   --   HGB 11.1* 10.4* 11.4*  HCT 34.1* 32.4* 34.1*  MCV 103.0* 102.2* 101.8*  PLT 231 213 212   Basic Metabolic Panel: Recent Labs  Lab 10/09/23 1307 10/10/23 0751 10/11/23 0328  NA 138 139 137  K 4.2 3.9 4.3  CL 103 100 100  CO2 26 29 27   GLUCOSE 128*  98 109*  BUN 28* 24* 28*  CREATININE 0.98 1.05* 0.99  CALCIUM  9.4 9.2 9.2  MG 2.0  --   --    Liver Function Tests: Recent Labs  Lab 10/09/23 1307  AST 22  ALT 17  ALKPHOS 83  BILITOT 0.7  PROT 6.9  ALBUMIN 3.8   CBG: No results for input(s): GLUCAP in the last 168 hours.  Discharge time spent: 34 minutes.  Signed: Concepcion Riser, MD [facility] 10/12/2023

## 2023-10-12 NOTE — Progress Notes (Signed)
 Patient ID: Kimberly Roy, female   DOB: [birth year], 89 y.o.   MRN: [id number] [facility]    SUBJECTIVE: Patient feels much improved less shortness of breath denies any palpitations or tachycardia Apple Watch tells her heart rates are but she has been doing reasonably well no chest pain improved weakness and fatigue and ready for discharge she has help at home with her son and caregivers   Vitals:   10/11/23 1608 10/11/23 2040 10/11/23 2359 10/12/23 0343  BP: (!) 157/73 (!) 143/81 139/66 (!) 159/112  Pulse: 74 90 73 83  Resp:  19 18 18   Temp: 98.7 F (37.1 C) 99 F (37.2 C) 98 F (36.7 C) 98 F (36.7 C)  TempSrc: Oral Oral Oral Oral  SpO2: 91% 95% 93% 95%  Weight:      Height:         Intake/Output Summary (Last 24 hours) at 10/12/2023 1308 Last data filed at 10/12/2023 0340 Gross per 24 hour  Intake 360 ml  Output --  Net 360 ml      PHYSICAL EXAM  General: Well developed, well nourished, in no acute distress HEENT:  Normocephalic and atramatic Neck:  No JVD.  Lungs: Clear bilaterally to auscultation and percussion. Heart: HRRR . Normal S1 and S2 without gallops or murmurs.  Abdomen: Bowel sounds are positive, abdomen soft and non-tender  Msk:  Back normal, normal gait. Normal strength and tone for age. Extremities: No clubbing, cyanosis or edema.   Neuro: Alert and oriented X 3. Psych:  Good affect, responds appropriately   LABS: Basic Metabolic Panel: Recent Labs    10/10/23 0751 10/11/23 0328  NA 139 137  K 3.9 4.3  CL 100 100  CO2 29 27  GLUCOSE 98 109*  BUN 24* 28*  CREATININE 1.05* 0.99  CALCIUM  9.2 9.2   Liver Function Tests: No results for input(s): AST, ALT, ALKPHOS, BILITOT, PROT, ALBUMIN in the last 72 hours. No results for input(s): LIPASE, AMYLASE in the last 72 hours. CBC: Recent Labs    10/10/23 0751 10/11/23 0328  WBC 7.7 9.5  HGB 10.4* 11.4*  HCT 32.4* 34.1*  MCV 102.2* 101.8*  PLT 213 212   Cardiac Enzymes: No  results for input(s): CKTOTAL, CKMB, CKMBINDEX, TROPONINI in the last 72 hours. BNP: Invalid input(s): POCBNP D-Dimer: No results for input(s): DDIMER in the last 72 hours. Hemoglobin A1C: No results for input(s): HGBA1C in the last 72 hours. Fasting Lipid Panel: No results for input(s): CHOL, HDL, LDLCALC, TRIG, CHOLHDL, LDLDIRECT in the last 72 hours. Thyroid  Function Tests: No results for input(s): TSH, T4TOTAL, T3FREE, THYROIDAB in the last 72 hours.  Invalid input(s): FREET3 Anemia Panel: No results for input(s): VITAMINB12, FOLATE, FERRITIN, TIBC, IRON , RETICCTPCT in the last 72 hours.  ECHOCARDIOGRAM COMPLETE Result Date: 10/11/2023    ECHOCARDIOGRAM REPORT   Patient Name:   [facility] Date of Exam: 10/11/2023 Medical Rec #:  [id number]   Height:       62.0 in Accession #:    [id number]  Weight:       173.9 lb Date of Birth:  1931-08-28    BSA:          1.802 m Patient Age:    89 years    BP:           154/88 mmHg Patient Gender: F           HR:           80 bpm.  Exam Location:  [facility] Procedure: 2D Echo Indications:     Atrial Fibrillation I48.91  History:         Patient has prior history of Echocardiogram examinations, most                  recent 07/14/2022.  Sonographer:     Thedora Louder RDCS, FASE Referring Phys:  [id number] CARALYN HUDSON Diagnosing Phys: Cara JONETTA Lovelace MD  Sonographer Comments: Technically challenging study due to limited acoustic windows, no apical window and suboptimal subcostal window. Image acquisition challenging due to patient body habitus and Image acquisition challenging due to respiratory motion. IMPRESSIONS  1. TDS.  2. Left ventricular ejection fraction, by estimation, is 55 to 60%. The left ventricle has normal function. The left ventricle has no regional wall motion abnormalities. Left ventricular diastolic function could not be evaluated.  3. Right ventricular systolic function is normal. The right  ventricular size is normal.  4. Left atrial size was mild to moderately dilated.  5. Right atrial size was mildly dilated.  6. The mitral valve is normal in structure. No evidence of mitral valve regurgitation.  7. The aortic valve is grossly normal. Aortic valve regurgitation is not visualized. Conclusion(s)/Recommendation(s): Poor windows for evaluation of left ventricular function by transthoracic echocardiography. Would recommend an alternative means of evaluation. FINDINGS  Left Ventricle: Left ventricular ejection fraction, by estimation, is 55 to 60%. The left ventricle has normal function. The left ventricle has no regional wall motion abnormalities. The left ventricular internal cavity size was normal in size. There is  borderline left ventricular hypertrophy. Left ventricular diastolic function could not be evaluated. Right Ventricle: The right ventricular size is normal. No increase in right ventricular wall thickness. Right ventricular systolic function is normal. Left Atrium: Left atrial size was mild to moderately dilated. Right Atrium: Right atrial size was mildly dilated. Pericardium: There is no evidence of pericardial effusion. Mitral Valve: The mitral valve is normal in structure. No evidence of mitral valve regurgitation. Tricuspid Valve: The tricuspid valve is normal in structure. Tricuspid valve regurgitation is not demonstrated. Aortic Valve: The aortic valve is grossly normal. Aortic valve regurgitation is not visualized. Pulmonic Valve: The pulmonic valve was normal in structure. Pulmonic valve regurgitation is not visualized. Aorta: The ascending aorta was not well visualized. IAS/Shunts: No atrial level shunt detected by color flow Doppler. Additional Comments: TDS.  LEFT VENTRICLE PLAX 2D LVIDd:         4.80 cm LVIDs:         3.30 cm LV PW:         1.20 cm LV IVS:        1.00 cm LVOT diam:     1.80 cm LVOT Area:     2.54 cm  LEFT ATRIUM         Index LA diam:    4.90 cm 2.72 cm/m                         PULMONIC VALVE AORTA                 PV Vmax:        0.93 m/s Ao Root diam: 3.10 cm PV Peak grad:   3.5 mmHg                       RVOT Peak grad: 4 mmHg  TRICUSPID VALVE TR Peak grad:   24.8 mmHg TR  Vmax:        249.00 cm/s  SHUNTS Systemic Diam: 1.80 cm Jannae Fagerstrom D Marquon Alcala MD Electronically signed by Cara JONETTA Lovelace MD Signature Date/Time: 10/11/2023/2:28:12 PM    Final      Echo preserved left ventricular function EF of at least 55%  TELEMETRY: Atrial fibrillation rate of 80 nonspecific ST-T changes:  ASSESSMENT AND PLAN:  Principal Problem:   Atrial fibrillation with RVR (HCC) Active Problems:   Pulmonary hypertension (HCC)   Hypertension   Hyperlipidemia   Hypothyroidism   Lymphedema   Acute on chronic diastolic CHF (congestive heart failure) (HCC)   Obesity (BMI 30.0-34.9)    Plan Atrial fibrillation new onset rate controlled with metoprolol  patient also on Eliquis  for anticoagulation amiodarone  p.o. for rhythm Continue medical therapy for A-fib follow-up in the office consider cardioversion if patient still in atrial fibrillation in a few weeks Hypertension management continue metoprolol  therapy Lymphedema mild continue diuretics as necessary Acute on chronic diastolic congestive heart failure preserved left ventricular function continue diuretics and beta-blocker History of pulm hypertension continue diuretic therapy supplemental oxygen  as necessary  follow-up with cardiology as well as pulmonary Obesity recommend modest weight loss exercise portion control Consider evaluation for obstructive sleep apnea sleep study CPAP weight loss   Cara JONETTA Lovelace, MD,  10/12/2023 1:08 PM

## 2023-10-12 NOTE — TOC Transition Note (Signed)
 Transition of Care [facility]) - Discharge Note   Patient Details  Name: Kimberly Roy MRN: [id number] Date of Birth: [birth year]  Transition of Care [facility]) CM/SW Contact:  Heron KATHEE Edison, RN Phone Number: 10/12/2023, 11:25 AM   Clinical Narrative:     Transition of Care [facility]) - Inpatient Brief Assessment   Patient Details  Name: Kimberly Roy MRN: [id number] Date of Birth: May 21, 1931  Transition of Care [facility]) CM/SW Contact:    Heron KATHEE Edison, RN Phone Number: 10/12/2023, 11:29 AM   Clinical Narrative: 2/9: Patient was admitted on 10/09/23 after being sent to ED from PCP office for heart rate irregularities without chest pain. Afib RVR listed as diagnosis. Htx of Heart Failure with HF instructions included in AVS.   RN CM spoke with patient regarding discharge plan and HH order per PT recommendations. Patient has a 4-day/week/ 6 hours a day, caregiver who is following PT instructions from prior discharge in Fall 2024 and patient verbally declines HH services at this time, but was advised to contact PCP if changes her mind or patient needs change once in home setting. Patient from home where lives with son. Daughter is from [place] and is coming to transport to home.   Patient requested cardiologist to speak to recent Cardiac US  prior to discharge, this was passed on the hospitalist provider who also spoke with patient earlier this am per patient.   Htx of Heart Failure with HF instructions included in AVS Patient stated she will follow up with PCP, Glendia Shad, MD. within one week.  Cardiologist. Dr. Ammon, already established with in 3 weeks.  New patient appointment with Darice Shivers at [facility] on 10/30/23 at 1000 am.    Bing Edison MSN RN CM  RN Case Manager [place]  Transitions of Care Direct Dial: [phone] (Weekends Only) [facility] Main Office Phone: [phone] [facility] Fax: [phone] [email or web address]  .      Transition of Care Asessment:  Note: PLOF below taken  from PT evaluation this admission.  Insurance and Status: Insurance coverage has been reviewed Patient has primary care physician: Yes Home environment has been reviewed: Lives with son and had 4day/week caregiver.Has PCA M-Thrs 9:30-3:30. Cousin helps on fridays but recently diagnosed with flu. Daughter is retired and can help if needed lives in [place]. Prior level of function:: Prior Level of Function : Needs assist  Physical Assist : Mobility (physical);ADLs (physical)  Mobility (physical): Gait  ADLs (physical): Grooming;Bathing;Dressing;IADLs  Mobility Comments: Uses rollator in home and short community distances.  ADLs Comments: PCA for ADL's and IADL's.Equipment: Rollator (4 wheels);Shower seat;Grab bars - toilet;Grab bars - tub/shower;Hand held shower head  (Per PT eval) Prior/Current Home Services: Current home services (PCS) Social Drivers of Health Review: SDOH reviewed no interventions necessary Readmission risk has been reviewed: Yes Transition of care needs: transition of care needs identified, TOC will continue to follow     Barriers to Discharge: No Barriers Identified   Patient Goals and CMS Choice        Expected Discharge Plan and Services: Declined HH on discharge, advised to contact PCP is changes her mind once at home. Has private care services 4 days/week.            Expected Discharge Date: 10/12/23               DME Arranged: N/A DME Agency: NA       HH Arranged: NA HH Agency: NA        Prior  Living Arrangements/Services                       Activities of Daily Living   ADL Screening (condition at time of admission) Independently performs ADLs?: No Does the patient have a NEW difficulty with bathing/dressing/toileting/self-feeding that is expected to last >3 days?: No Does the patient have a NEW difficulty with getting in/out of bed, walking, or climbing stairs that is expected to last >3 days?: No Does the patient have a NEW difficulty  with communication that is expected to last >3 days?: No Is the patient deaf or have difficulty hearing?: No Does the patient have difficulty seeing, even when wearing glasses/contacts?: No Does the patient have difficulty concentrating, remembering, or making decisions?: No  Permission Sought/Granted                  Emotional Assessment              Admission diagnosis:  Atrial fibrillation with rapid ventricular response (HCC) [I48.91] Atrial fibrillation with RVR (HCC) [I48.91] Patient Active Problem List   Diagnosis Date Noted   Obesity (BMI 30.0-34.9) 10/12/2023   Tachycardia 10/09/2023   Atrial fibrillation with RVR (HCC) 10/09/2023   Vaginal irritation 07/13/2023   Dysuria 06/29/2023   Asthma 05/15/2023   Fall 03/15/2023   Difficulty swallowing 02/13/2023   Bartholin cyst 02/11/2023   Abnormal CT of the abdomen 10/27/2022   Trochanteric bursitis, right hip 10/10/2022   Earlobe lesion, right 09/12/2022   Pneumonia 07/12/2022   Hypertensive urgency 07/12/2022   Aortic stenosis 07/12/2022   Soft tissue injury 07/12/2022   Anemia 04/13/2022   B12 deficiency 04/04/2022   Interstitial cystitis 03/28/2022   Iron  deficiency 02/28/2022   Right foot pain 01/29/2022   SOB (shortness of breath) 10/14/2021   Rash 03/03/2021   Skin lesion 03/03/2021   Aortic atherosclerosis (HCC) 11/20/2020   Bradycardia 06/08/2020   Itching 04/08/2020   Dyspnea 01/17/2020   Chronic respiratory failure with hypoxia (HCC) 01/17/2020   Right hip pain 01/10/2020   Leukocytosis 11/15/2019   Wound of buttock 10/25/2019   COVID-19 virus infection 10/04/2019   Urinary frequency 09/26/2019   Cold feeling 09/12/2019   Knee pain 07/04/2019   Hemoptysis 06/30/2019   Acute on chronic diastolic CHF (congestive heart failure) (HCC) 08/10/2018   Femur fracture (HCC) 08/06/2018   Primary osteoarthritis of right shoulder 08/15/2017   Shoulder pain 05/16/2017   Chronic venous insufficiency  02/10/2017   Lymphedema 02/10/2017   Neuropathy 12/25/2016   Anemia due to blood loss, chronic 08/07/2016   GERD (gastroesophageal reflux disease) 12/10/2015   Finger pain 11/13/2015   Carotid artery calcification 09/26/2015   External nasal lesion 09/25/2015   Muscle cramps 09/01/2015   Excessive sweating 07/30/2015   Headache 07/16/2015   Groin pain 05/14/2015   Muscle twitching 03/05/2015   Leg cramps 02/11/2015   Back pain 02/11/2015   Abdominal pain 02/11/2015   Chronic pain 01/19/2015   Acute cystitis without hematuria 12/12/2014   Left elbow pain 11/30/2014   Health care maintenance 11/30/2014   Osteoporosis 10/19/2014   Rectal bleeding 10/19/2014   Neck pain 09/03/2014   Unsteady gait 09/03/2014   Nocturia 09/03/2014   Dysphagia 06/01/2014   Nasal dryness 06/01/2014   Stress 06/01/2014   Fatigue 02/27/2014   Degenerative disc disease 12/21/2013   Pre-op evaluation 10/12/2013   Hoarseness 08/14/2013   Leg swelling 01/24/2013   CHF (congestive heart failure) (HCC) 12/29/2012   Cough  12/29/2012   OSA (obstructive sleep apnea) 07/09/2012   Osteoarthritis 07/04/2012   Symptomatic anemia 07/04/2012   Chronic constipation 07/04/2012   Pulmonary hypertension (HCC) 07/04/2012   Pulmonary nodules 07/04/2012   Hypertension 07/04/2012   Hyperlipidemia 07/04/2012   Hypothyroidism 07/04/2012   PCP:  Glendia Shad, MD Pharmacy:   [phone] [place], [place] - [address] [place] [address] Phone: [phone] Fax: [phone]  [facility] - [place], [place] - [address] [place] [address] Phone: [phone] Fax: [phone]  [id number] [place], [place] - [address] AT [facility] OF [facility] & [address] [place] [address] Phone: [phone] Fax: [phone]  [facility] [address] [facility] - [place], [place] - [address] [facility] AT  [facility] [address] [place] [address] Phone: [phone] Fax: [phone]     Social Determinants of Health (SDOH) Interventions    Readmission Risk Interventions     No data to display          Final next level of care: Home/Self Care (Declined [facility] though orders in chart.) Barriers to Discharge: No Barriers Identified   Patient Goals and CMS Choice            Discharge Placement                    Patient and family notified of of transfer: 10/12/23  Discharge Plan and Services Additional resources added to the After Visit Summary for                  DME Arranged: N/A DME Agency: NA       HH Arranged: NA HH Agency: NA        Social Drivers of Health (SDOH) Interventions SDOH Screenings   Food Insecurity: No Food Insecurity (10/10/2023)  Housing: Low Risk  (10/10/2023)  Transportation Needs: No Transportation Needs (10/10/2023)  Utilities: Not At Risk (10/10/2023)  Depression (PHQ2-9): Low Risk  (02/13/2023)  Financial Resource Strain: Low Risk  (09/16/2023)  Physical Activity: Inactive (09/16/2023)  Social Connections: Socially Isolated (10/10/2023)  Stress: No Stress Concern Present (09/16/2023)  Tobacco Use: Low Risk  (10/09/2023)     Readmission Risk Interventions     No data to display

## 2023-10-12 NOTE — Plan of Care (Signed)
  Problem: Clinical Measurements: Goal: Diagnostic test results will improve Outcome: Progressing   Problem: Activity: Goal: Risk for activity intolerance will decrease Outcome: Progressing   Problem: Coping: Goal: Level of anxiety will decrease Outcome: Progressing   

## 2023-10-13 ENCOUNTER — Telehealth: Payer: Self-pay

## 2023-10-13 NOTE — Transitions of Care (Post Inpatient/ED Visit) (Signed)
 10/13/2023  Name: Kimberly Roy MRN: [id number] DOB: 06/30/1931  Today's TOC FU Call Status: Today's TOC FU Call Status:: Successful TOC FU Call Completed TOC FU Call Complete Date: 10/13/23 Patient's Name and Date of Birth confirmed.  Transition Care Management Follow-up Telephone Call Date of Discharge: 10/12/23 Discharge Facility: [facility]) Type of Discharge: Inpatient Admission Primary Inpatient Discharge Diagnosis:: A-fib How have you been since you were released from the hospital?: Better Any questions or concerns?: No  Items Reviewed: Did you receive and understand the discharge instructions provided?: Yes Medications obtained,verified, and reconciled?: Yes (Medications Reviewed) Any new allergies since your discharge?: No Dietary orders reviewed?: Yes Type of Diet Ordered:: Low sodium heart healthy Do you have support at home?: Yes People in Home: child(ren), adult Name of Support/Comfort Primary Source: Stephenie Einstein, daughter  Medications Reviewed Today: Medications Reviewed Today     Reviewed by Claudene Crystal, RN (Case Manager) on 10/13/23 at 1456  Med List Status: <None>   Medication Order Taking? Sig Documenting Provider Last Dose Status Informant  albuterol  (VENTOLIN  HFA) 108 (90 Base) MCG/ACT inhaler [id number] No INHALE 2 PUFFS INTO THE LUNGS EVERY 6 HOURS AS NEEDED FOR WHEEZING OR SHORTNESS OF Eric Hatter, MD Taking Active Child, Multiple Informants, Pharmacy Records  aluminum-magnesium  hydroxide-simethicone  (MAALOX) 200-200-20 MG/5ML SUSP [id number] No Take 15 mLs by mouth 4 (four) times daily -  before meals and at bedtime.  [provider] Taking Active Child, Multiple Informants, Pharmacy Records           Med Note Thomasena Fleming   Fri Jul 12, 2022  1:32 AM)    amiodarone  (PACERONE ) 200 MG tablet [id number]  Take 1 tablet (200 mg total) by mouth daily. Aisha Hove, MD  Active   amLODipine  (NORVASC ) 10  MG tablet [id number] No Take 1 tablet (10 mg total) by mouth daily. Dellar Fenton, MD 10/09/2023 Morning Active Child, Multiple Informants, Pharmacy Records  apixaban  (ELIQUIS ) 2.5 MG TABS tablet [id number]  Take 1 tablet (2.5 mg total) by mouth 2 (two) times daily. Aisha Hove, MD  Active   atorvastatin  (LIPITOR) 20 MG tablet [id number] No TAKE 1 TABLET BY MOUTH AT BEDTIME Dellar Fenton, MD 10/08/2023 Active Child, Multiple Informants, Pharmacy Records  benazepril  (LOTENSIN ) 40 MG tablet [id number] No Take 1 tablet (40 mg total) by mouth daily. Dellar Fenton, MD 10/09/2023 Morning Active Child, Multiple Informants, Pharmacy Records  betamethasone  dipropionate 0.05 % cream [id number] No Apply 1 application topically 2 (two) times daily. [provider] Taking Active Child, Multiple Informants, Pharmacy Records           Med Note Sherald Dienes, Arlana Bellini   Fri Jul 12, 2022  1:32 AM)    budesonide -formoterol  (SYMBICORT ) 80-4.5 MCG/ACT inhaler [id number] No Inhale 2 puffs into the lungs in the morning and at bedtime. Vergia Glasgow, MD 10/09/2023 Morning Active Child, Multiple Informants, Pharmacy Records  busPIRone  (BUSPAR ) 10 MG tablet [id number] No Take 10 mg by mouth 3 (three) times daily. [provider] 10/09/2023 Morning Active Child, Multiple Informants, Pharmacy Records  calcium  citrate-vitamin D  (CITRACAL+D) 315-200 MG-UNIT tablet [id number] No Take 2 tablets by mouth daily. [provider] 10/09/2023 Morning Active Child, Multiple Informants, Pharmacy Records  Cholecalciferol  (VITAMIN D3) 1000 UNITS CAPS [id number] No Take 1 capsule by mouth daily. [provider] 10/09/2023 Morning Active Child, Multiple Informants, Pharmacy Records  clobetasol  cream (TEMOVATE ) 0.05 % [id number] No Apply 1 application topically See admin instructions. Apply to affected areas of body 1 -  2 times daily as needed for itchy bumps. Avoid applying to face, groin, and axilla. Use as directed.  Artemio Larry, MD Taking Active Child, Multiple Informants, Pharmacy Records  cyanocobalamin  (VITAMIN B12) 1000 MCG/ML injection [id number] No INJECT 1 ML INTO THE MUSCLE EVERY 3 MONTHS. Gwyn Leos, MD 09/03/2023 Active Child, Multiple Informants, Pharmacy Records  Diclofenac  Sodium 3 % GEL [id number] No Apply topically 2 (two) times daily. [provider] Taking Active Child, Multiple Informants, Pharmacy Records           Med Note Thomasena Fleming   Fri Jul 12, 2022  1:32 AM)    Docusate Sodium  (DSS) 100 MG CAPS [id number] No Take 100 mg by mouth 2 (two) times daily. [provider] 10/09/2023 Morning Active Child, Multiple Informants, Pharmacy Records  DULoxetine  (CYMBALTA ) 20 MG capsule [id number] No Take 20 mg by mouth daily. [provider] 10/09/2023 Morning Active Child, Multiple Informants, Pharmacy Records  DULoxetine  (CYMBALTA ) 60 MG capsule [id number] No Take 60 mg by mouth daily. [provider] 10/09/2023 Morning Active Child, Multiple Informants, Pharmacy Records  Fluocinolone  Acetonide 0.01 % OIL [id number] No Apply 1-2 drops into ears once to twice daily as needed. Artemio Larry, MD Taking Active Child, Multiple Informants, Pharmacy Records  fluocinonide  (LIDEX ) 0.05 % external solution [id number] No Apply topically 2 (two) times daily. Apply drops to scalp for itch Artemio Larry, MD Taking Active Child, Multiple Informants, Pharmacy Records  fluticasone  (FLONASE ) 50 MCG/ACT nasal spray [id number] No Place 2 sprays into both nostrils daily. Dellar Fenton, MD 10/09/2023 Morning Active Child, Multiple Informants, Pharmacy Records  furosemide  (LASIX ) 20 MG tablet [id number] No TAKE 1 TABLET BY MOUTH DAILY AS NEEDED  Patient taking differently: Take 20 mg by mouth daily.   Dellar Fenton, MD 10/09/2023 Active Child, Multiple Informants, Pharmacy Records           Med Note Thomasena Fleming   Fri Oct 10, 2023  8:57 AM)    gabapentin  (NEURONTIN ) 300 MG  capsule [id number] No TAKE 2 CAPSULES BY MOUTH 3 TIMES A DAY (IN THE MORNING, MIDDAY AND AT BEDTIME). Dellar Fenton, MD 10/09/2023 Noon Active Child, Multiple Informants, Pharmacy Records  HYDROcodone -acetaminophen  [facility]) 10-325 MG tablet [id number] No Take 1 tablet by mouth 3 (three) times daily. Melvinia Stager, MD 10/09/2023 Kathie Panther Child, Multiple Informants, Pharmacy Records  [facility] [place]) [place] [id number] No Apply topically as directed (as discussed). Dellar Fenton, MD Taking Active Child, Multiple Informants, Pharmacy Records  ipratropium (ATROVENT ) 0.03 % nasal spray [id number] No Place 2 sprays into both nostrils every 12 (twelve) hours. Dellar Fenton, MD 10/09/2023 Morning Active Child, Multiple Informants, Pharmacy Records  ketoconazole  (NIZORAL ) 2 % shampoo [id number] No Apply 1 Application topically See admin instructions. apply three times per week, massage into scalp and leave in for 10 minutes before rinsing out Artemio Larry, MD Taking Active Child, Multiple Informants, Pharmacy Records  levalbuterol  (XOPENEX ) 0.63 MG/3ML nebulizer solution [id number] No Take 3 mLs (0.63 mg total) by nebulization every 6 (six) hours as needed for wheezing or shortness of breath.  Patient not taking: Reported on 10/10/2023   Dellar Fenton, MD Not Taking Active Child, Multiple Informants, Pharmacy Records  LINZESS  290 MCG CAPS capsule [id number] No Take 290 mcg by mouth daily. [provider] 10/09/2023 Morning Active Child, Multiple Informants, Pharmacy Records  magnesium  oxide (MAG-OX) 400 MG tablet [id number] No Take 1 tablet (400 mg total) by mouth daily. Dellar Fenton, MD 10/09/2023 Morning Active Child, Multiple Informants, Pharmacy  Records  metoprolol  tartrate (LOPRESSOR ) 25 MG tablet [id number] No Take 25 mg by mouth daily. [provider] 10/09/2023 Morning Active Child, Multiple Informants, Pharmacy Records  morphine  (MS CONTIN ) 30 MG 12 hr tablet [id number] No Take 30 mg  by mouth daily. 1400 [provider] 10/09/2023 Noon Active Child, Multiple Informants, Pharmacy Records  morphine  (MS CONTIN ) 60 MG 12 hr tablet [id number] No Take 1 tablet (60 mg total) by mouth 2 (two) times daily. Patel, Sona, MD 10/09/2023 Morning Active Child, Multiple Informants, Pharmacy Records  mupirocin  ointment (BACTROBAN ) 2 % [id number] No Apply 1 Application topically daily. Qd to excision site Artemio Larry, MD Taking Active Child, Multiple Informants, Pharmacy Records  nystatin  (MYCOSTATIN /NYSTOP ) powder [id number] No Apply 1 application. topically 2 (two) times daily as needed. Dellar Fenton, MD Taking Active Child, Multiple Informants, Pharmacy Records  nystatin  cream (MYCOSTATIN ) [id number] No Apply 1 Application topically 2 (two) times daily. Dellar Fenton, MD Taking Active Child, Multiple Informants, Pharmacy Records  pantoprazole  (PROTONIX ) 40 MG tablet [id number] No Take 1 tablet (40 mg total) by mouth 2 (two) times daily. Dellar Fenton, MD 10/09/2023 Morning Active Child, Multiple Informants, Pharmacy Records  polyethylene glycol powder (GLYCOLAX /MIRALAX ) 17 GM/SCOOP powder [id number] No MIX 17 GRAMS AS MARKED ON BOTTLE TOP IN 8 OUNCES OF WATER AND DRINK ONCE A DAY AS DIRECTED. Dellar Fenton, MD Taking Active Child, Multiple Informants, Pharmacy Records  predniSONE  (DELTASONE ) 10 MG tablet [id number] No Taper dose 60mg x2 days/50mg x2 days/40mg x2 days/30mg x2 days/20mg x2 days/10mg x2 days, stop Pt started 10/07/23 [provider] 10/09/2023 Active Child, Multiple Informants, Pharmacy Records           Med Note Thomasena Fleming   Fri Oct 10, 2023  9:10 AM) Pt started 10/07/2023  Simethicone  (GAS-X PO) [id number] No Take 2 tablets by mouth 2 (two) times daily. [provider] Taking Active Child, Multiple Informants, Pharmacy Records  sodium chloride  (OCEAN) 0.65 % nasal spray [id number] No Place 1 spray into the nose as needed. [provider] Taking Active  Child, Multiple Informants, Pharmacy Records  SYNTHROID  100 MCG tablet [id number] No TAKE 1 TABLET BY MOUTH DAILY Dellar Fenton, MD 10/09/2023 Morning Active Child, Multiple Informants, Pharmacy Records  triamcinolone  ointment (KENALOG ) 0.1 % [id number] No APPLY TWICE DAILY TO BITES AND RASH UNTIL FLAT AND SMOOTH **DO NOT APPLY TO FACEDellar Fenton, MD Taking Active Child, Multiple Informants, Pharmacy Records            Home Care and Equipment/Supplies: Were Home Health Services Ordered?: NA Any new equipment or medical supplies ordered?: NA  Functional Questionnaire: Do you need assistance with bathing/showering or dressing?: No Do you need assistance with meal preparation?: No Do you need assistance with eating?: No Do you have difficulty maintaining continence: No Do you need assistance with getting out of bed/getting out of a chair/moving?: No Do you have difficulty managing or taking your medications?: No  Follow up appointments reviewed: PCP Follow-up appointment confirmed?: Yes Date of PCP follow-up appointment?: 10/16/23 Follow-up Provider: [facility] Follow-up appointment confirmed?: NA Do you need transportation to your follow-up appointment?: No Do you understand care options if your condition(s) worsen?: Yes-patient verbalized understanding  SDOH Interventions Today    Flowsheet Row Most Recent Value  SDOH Interventions   Food Insecurity Interventions Intervention Not Indicated  Housing Interventions Intervention Not Indicated  Transportation Interventions Intervention Not Indicated  Utilities Interventions Intervention Not Indicated  Social Connections Interventions Intervention Not Indicated      Interventions Today  Flowsheet Row Most Recent Value  Chronic Disease   Chronic disease during today's visit Atrial Fibrillation (AFib)  General Interventions   General Interventions Discussed/Reviewed General Interventions Discussed   Education Interventions   Education Provided Provided Education  Provided Verbal Education On Insurance Plans  Pharmacy Interventions   Pharmacy Dicussed/Reviewed Medications and their functions  Safety Interventions   Safety Discussed/Reviewed Fall Risk        TOC Outreach completed today. The patient is discharged home with self care. She states she feels safe at home but is tired. Follow Up appointments arranged. The patient has caregivers daily who assist with meal prep, ADL's and driving to appointments. Medications reviewed. No concerns. Encouraged patient to call her insurance company to discuss benefits available to her through her plan.  The patient has been provided with contact information for the care management team and has been advised to call with any health-related questions or concerns. The patient verbalized understanding with current POC. The patient is directed to their insurance card regarding availability of benefits coverage.  Gareld June, BSN, RN [place]  [facility]BC[facility] - [employer] [facility] RN Care Manager [phone]

## 2023-10-16 ENCOUNTER — Encounter: Payer: Self-pay | Admitting: Internal Medicine

## 2023-10-16 ENCOUNTER — Ambulatory Visit: Payer: Medicare Other | Admitting: Internal Medicine

## 2023-10-16 ENCOUNTER — Telehealth: Payer: Self-pay

## 2023-10-16 VITALS — BP 120/68 | HR 144 | Temp 98.2°F | Ht 62.0 in | Wt 179.2 lb

## 2023-10-16 DIAGNOSIS — I509 Heart failure, unspecified: Secondary | ICD-10-CM

## 2023-10-16 DIAGNOSIS — M7989 Other specified soft tissue disorders: Secondary | ICD-10-CM

## 2023-10-16 DIAGNOSIS — E039 Hypothyroidism, unspecified: Secondary | ICD-10-CM

## 2023-10-16 DIAGNOSIS — I4891 Unspecified atrial fibrillation: Secondary | ICD-10-CM

## 2023-10-16 DIAGNOSIS — J9611 Chronic respiratory failure with hypoxia: Secondary | ICD-10-CM

## 2023-10-16 DIAGNOSIS — I700 Atherosclerosis of aorta: Secondary | ICD-10-CM

## 2023-10-16 DIAGNOSIS — E785 Hyperlipidemia, unspecified: Secondary | ICD-10-CM | POA: Diagnosis not present

## 2023-10-16 DIAGNOSIS — J4520 Mild intermittent asthma, uncomplicated: Secondary | ICD-10-CM

## 2023-10-16 DIAGNOSIS — F439 Reaction to severe stress, unspecified: Secondary | ICD-10-CM

## 2023-10-16 DIAGNOSIS — I10 Essential (primary) hypertension: Secondary | ICD-10-CM | POA: Diagnosis not present

## 2023-10-16 DIAGNOSIS — K219 Gastro-esophageal reflux disease without esophagitis: Secondary | ICD-10-CM

## 2023-10-16 DIAGNOSIS — I272 Pulmonary hypertension, unspecified: Secondary | ICD-10-CM

## 2023-10-16 MED ORDER — METOPROLOL TARTRATE 25 MG PO TABS: ORAL_TABLET | ORAL | 2 refills | Status: DC

## 2023-10-16 MED ORDER — GABAPENTIN 300 MG PO CAPS: ORAL_CAPSULE | ORAL | 1 refills | Status: DC

## 2023-10-16 NOTE — Telephone Encounter (Signed)
Copied from CRM [id number]. Topic: General - Other >> Oct 16, 2023 11:02 AM Elizebeth Brooking wrote: Reason for CRM: Felicity Pellegrini from [facility] called to advised that patient needs to undergo 6 minute walk test or any type of stress test. And also needs to provide chart notes. Will be faxing over form today for this matter

## 2023-10-16 NOTE — Progress Notes (Signed)
Subjective:    Patient ID: Kimberly Roy, female    DOB: Jul 04, 1931, 89 y.o.   MRN: [id number]  Patient here for  Chief Complaint  Patient presents with   Hospitalization Follow-up    HPI Here for hospital follow up. Admitted 10/09/23 - 10/12/23 - new onset afib with RVR. Placed on IV amiodarone. This was transitioned to oral amiodarone. Continued on metoprolol. ECHO - EF 55-60%. CXR revealed no pulmonary edema. Troponin flat. She received a one time dose of IV lasix. Continued on oral lasix. Since discharge, she has been checking her pulse. Will notice some variation in her pulse rate - on her pulse ox. She does not feel the afib. Denies any increased heart rate or palpitations. No chest pain reported. Breathing overall stable. No increased cough or congestion. No abdominal pain. Due to f/u with cardiology 2/17.    Past Medical History:  Diagnosis Date   Anemia    Anxiety    Chest pain    CHF (congestive heart failure) (HCC)    Constipation    DDD (degenerative disc disease), cervical    Depression    DVT (deep venous thrombosis) (HCC)    Dysphonia    Dyspnea    Fatty liver    Fatty liver    Headache    Hyperlipidemia    Hyperpiesia    Hypertension    Hypothyroidism    Interstitial cystitis    Left ventricular dysfunction    Lymphedema    Nephrolithiasis    Obstructive sleep apnea    Osteoarthritis    knees/cervical and lumbar spine   Pulmonary hypertension (HCC)    Pulmonary nodules    followed by Dr Meredeth Ide   Pure hypercholesterolemia    Renal cyst    right   Past Surgical History:  Procedure Laterality Date   ABDOMINAL HYSTERECTOMY     ovaries left in place   APPENDECTOMY     Back Surgeries     BACK SURGERY     BREAST REDUCTION SURGERY     3/99   CARDIAC CATHETERIZATION     cataracts Bilateral    CERVICAL SPINE SURGERY     ESOPHAGEAL MANOMETRY N/A 08/02/2015   Procedure: ESOPHAGEAL MANOMETRY (EM);  Surgeon: Elnita Maxwell, MD;  Location: [facility] ENDOSCOPY;   Service: Endoscopy;  Laterality: N/A;   ESOPHAGOGASTRODUODENOSCOPY N/A 02/27/2015   Procedure: ESOPHAGOGASTRODUODENOSCOPY (EGD);  Surgeon: Wallace Cullens, MD;  Location: [facility] ENDOSCOPY;  Service: Gastroenterology;  Laterality: N/A;   ESOPHAGOGASTRODUODENOSCOPY (EGD) WITH PROPOFOL N/A 10/30/2018   Procedure: ESOPHAGOGASTRODUODENOSCOPY (EGD) WITH PROPOFOL;  Surgeon: Christena Deem, MD;  Location: [facility] ENDOSCOPY;  Service: Endoscopy;  Laterality: N/A;   EXCISIONAL HEMORRHOIDECTOMY     EYE SURGERY     FRACTURE SURGERY     HEMORRHOID SURGERY     HIP SURGERY  2013   Right hip surgery   JOINT REPLACEMENT     KNEE ARTHROSCOPY     left and right   ORIF FEMUR FRACTURE Right 08/07/2018   Procedure: OPEN REDUCTION INTERNAL FIXATION (ORIF) DISTAL FEMUR FRACTURE;  Surgeon: Kennedy Bucker, MD;  Location: [facility] ORS;  Service: Orthopedics;  Laterality: Right;   REDUCTION MAMMAPLASTY Bilateral YRS AGO   REPLACEMENT TOTAL KNEE Bilateral    rotator cuff surgery     blilateral   TONSILECTOMY/ADENOIDECTOMY WITH MYRINGOTOMY     VISCERAL ARTERY INTERVENTION N/A 02/25/2019   Procedure: VISCERAL ARTERY INTERVENTION;  Surgeon: Annice Needy, MD;  Location: [facility] INVASIVE CV LAB;  Service:  Cardiovascular;  Laterality: N/A;   Family History  Problem Relation Age of Onset   Heart disease Mother    Stroke Mother    Hypertension Mother    Heart disease Father        myocardial infarction age 88   Breast cancer Neg Hx    Social History   Socioeconomic History   Marital status: Widowed    Spouse name: Not on file   Number of children: 2   Years of education: 10   Highest education level: Associate degree: occupational, Scientist, product/process development, or vocational program  Occupational History    Comment: Engineer, site  Tobacco Use   Smoking status: Never   Smokeless tobacco: Never  Vaping Use   Vaping status: Never Used  Substance and Sexual Activity   Alcohol use: No    Alcohol/week: 0.0 standard drinks of alcohol   Drug  use: Never   Sexual activity: Not Currently  Other Topics Concern   Not on file  Social History Narrative   Not on file   Social Drivers of Health   Financial Resource Strain: Low Risk  (09/16/2023)   Overall Financial Resource Strain (CARDIA)    Difficulty of Paying Living Expenses: Not very hard  Food Insecurity: No Food Insecurity (10/13/2023)   Hunger Vital Sign    Worried About Running Out of Food in the Last Year: Never true    Ran Out of Food in the Last Year: Never true  Transportation Needs: No Transportation Needs (10/13/2023)   PRAPARE - Administrator, Civil Service (Medical): No    Lack of Transportation (Non-Medical): No  Physical Activity: Inactive (09/16/2023)   Exercise Vital Sign    Days of Exercise per Week: 0 days    Minutes of Exercise per Session: 10 min  Stress: No Stress Concern Present (09/16/2023)   [employer] of Occupational Health - Occupational Stress Questionnaire    Feeling of Stress : Not at all  Social Connections: Socially Isolated (10/13/2023)   Social Connection and Isolation Panel [NHANES]    Frequency of Communication with Friends and Family: More than three times a week    Frequency of Social Gatherings with Friends and Family: More than three times a week    Attends Religious Services: Never    Database administrator or Organizations: No    Attends Banker Meetings: Never    Marital Status: Widowed     Review of Systems  Constitutional:  Negative for appetite change and unexpected weight change.  HENT:  Negative for congestion and sinus pressure.   Respiratory:  Negative for cough and chest tightness.        Breathing overall stable.   Cardiovascular:  Negative for chest pain and palpitations.       No increased swelling. Wearing compression hose.   Gastrointestinal:  Negative for abdominal pain, nausea and vomiting.  Genitourinary:  Negative for difficulty urinating and dysuria.  Musculoskeletal:   Negative for joint swelling and myalgias.  Skin:  Negative for color change and rash.  Neurological:  Negative for dizziness and headaches.  Psychiatric/Behavioral:  Negative for agitation and dysphoric mood.        Objective:     BP 120/68   Pulse (!) 144   Temp 98.2 F (36.8 C) (Oral)   Wt 179 lb 3.2 oz (81.3 kg)   SpO2 97%   BMI 32.78 kg/m  Wt Readings from Last 3 Encounters:  10/16/23 179 lb 3.2 oz (81.3  kg)  10/09/23 173 lb 15.1 oz (78.9 kg)  10/09/23 174 lb (78.9 kg)    Physical Exam Vitals reviewed.  Constitutional:      General: She is not in acute distress.    Appearance: Normal appearance.  HENT:     Head: Normocephalic and atraumatic.     Right Ear: External ear normal.     Left Ear: External ear normal.  Eyes:     General: No scleral icterus.       Right eye: No discharge.        Left eye: No discharge.     Conjunctiva/sclera: Conjunctivae normal.  Neck:     Thyroid: No thyromegaly.  Cardiovascular:     Comments: Irregularly irregular. Rate 96-100.  Pulmonary:     Effort: No respiratory distress.     Breath sounds: Normal breath sounds. No wheezing.  Abdominal:     General: Bowel sounds are normal.     Palpations: Abdomen is soft.     Tenderness: There is no abdominal tenderness.  Musculoskeletal:        General: No swelling or tenderness.     Cervical back: Neck supple. No tenderness.  Lymphadenopathy:     Cervical: No cervical adenopathy.  Skin:    Findings: No erythema or rash.  Neurological:     Mental Status: She is alert.  Psychiatric:        Mood and Affect: Mood normal.        Behavior: Behavior normal.         Outpatient Encounter Medications as of 10/16/2023  Medication Sig   albuterol (VENTOLIN HFA) 108 (90 Base) MCG/ACT inhaler INHALE 2 PUFFS INTO THE LUNGS EVERY 6 HOURS AS NEEDED FOR WHEEZING OR SHORTNESS OF BREATH   aluminum-magnesium hydroxide-simethicone (MAALOX) 200-200-20 MG/5ML SUSP Take 15 mLs by mouth 4 (four) times  daily -  before meals and at bedtime.    amiodarone (PACERONE) 200 MG tablet Take 1 tablet (200 mg total) by mouth daily.   amLODipine (NORVASC) 10 MG tablet Take 1 tablet (10 mg total) by mouth daily.   apixaban (ELIQUIS) 2.5 MG TABS tablet Take 1 tablet (2.5 mg total) by mouth 2 (two) times daily.   atorvastatin (LIPITOR) 20 MG tablet TAKE 1 TABLET BY MOUTH AT BEDTIME   benazepril (LOTENSIN) 40 MG tablet Take 1 tablet (40 mg total) by mouth daily.   betamethasone dipropionate 0.05 % cream Apply 1 application topically 2 (two) times daily.   budesonide-formoterol (SYMBICORT) 80-4.5 MCG/ACT inhaler Inhale 2 puffs into the lungs in the morning and at bedtime.   busPIRone (BUSPAR) 10 MG tablet Take 10 mg by mouth 3 (three) times daily.   calcium citrate-vitamin D (CITRACAL+D) 315-200 MG-UNIT tablet Take 2 tablets by mouth daily.   Cholecalciferol (VITAMIN D3) 1000 UNITS CAPS Take 1 capsule by mouth daily.   clobetasol cream (TEMOVATE) 0.05 % Apply 1 application topically See admin instructions. Apply to affected areas of body 1 - 2 times daily as needed for itchy bumps. Avoid applying to face, groin, and axilla. Use as directed.   cyanocobalamin (VITAMIN B12) 1000 MCG/ML injection INJECT 1 ML INTO THE MUSCLE EVERY 3 MONTHS.   Diclofenac Sodium 3 % GEL Apply topically 2 (two) times daily.   Docusate Sodium (DSS) 100 MG CAPS Take 100 mg by mouth 2 (two) times daily.   DULoxetine (CYMBALTA) 20 MG capsule Take 20 mg by mouth daily.   DULoxetine (CYMBALTA) 60 MG capsule Take 60 mg by mouth  daily.   Fluocinolone Acetonide 0.01 % OIL Apply 1-2 drops into ears once to twice daily as needed.   fluocinonide (LIDEX) 0.05 % external solution Apply topically 2 (two) times daily. Apply drops to scalp for itch   fluticasone (FLONASE) 50 MCG/ACT nasal spray Place 2 sprays into both nostrils daily.   furosemide (LASIX) 20 MG tablet TAKE 1 TABLET BY MOUTH DAILY AS NEEDED (Patient taking differently: Take 20 mg by  mouth daily.)   HYDROcodone-acetaminophen (NORCO) 10-325 MG tablet Take 1 tablet by mouth 3 (three) times daily.   Infant Care Products [facility]) OINT Apply topically as directed (as discussed).   ipratropium (ATROVENT) 0.03 % nasal spray Place 2 sprays into both nostrils every 12 (twelve) hours.   ketoconazole (NIZORAL) 2 % shampoo Apply 1 Application topically See admin instructions. apply three times per week, massage into scalp and leave in for 10 minutes before rinsing out   levalbuterol (XOPENEX) 0.63 MG/3ML nebulizer solution Take 3 mLs (0.63 mg total) by nebulization every 6 (six) hours as needed for wheezing or shortness of breath.   LINZESS 290 MCG CAPS capsule Take 290 mcg by mouth daily.   magnesium oxide (MAG-OX) 400 MG tablet Take 1 tablet (400 mg total) by mouth daily.   morphine (MS CONTIN) 30 MG 12 hr tablet Take 30 mg by mouth daily. 1400   morphine (MS CONTIN) 60 MG 12 hr tablet Take 1 tablet (60 mg total) by mouth 2 (two) times daily.   mupirocin ointment (BACTROBAN) 2 % Apply 1 Application topically daily. Qd to excision site   nystatin (MYCOSTATIN/NYSTOP) powder Apply 1 application. topically 2 (two) times daily as needed.   nystatin cream (MYCOSTATIN) Apply 1 Application topically 2 (two) times daily.   pantoprazole (PROTONIX) 40 MG tablet Take 1 tablet (40 mg total) by mouth 2 (two) times daily.   polyethylene glycol powder (GLYCOLAX/MIRALAX) 17 GM/SCOOP powder MIX 17 GRAMS AS MARKED ON BOTTLE TOP IN 8 OUNCES OF WATER AND DRINK ONCE A DAY AS DIRECTED.   predniSONE (DELTASONE) 10 MG tablet Taper dose 60mg x2 days/50mg x2 days/40mg x2 days/30mg x2 days/20mg x2 days/10mg x2 days, stop Pt started 10/07/23   Simethicone (GAS-X PO) Take 2 tablets by mouth 2 (two) times daily.   sodium chloride (OCEAN) 0.65 % nasal spray Place 1 spray into the nose as needed.   SYNTHROID 100 MCG tablet TAKE 1 TABLET BY MOUTH DAILY   triamcinolone ointment (KENALOG) 0.1 % APPLY TWICE DAILY TO BITES  AND RASH UNTIL FLAT AND SMOOTH **DO NOT APPLY TO FACE**   [DISCONTINUED] gabapentin (NEURONTIN) 300 MG capsule TAKE 2 CAPSULES BY MOUTH 3 TIMES A DAY (IN THE MORNING, MIDDAY AND AT BEDTIME).   [DISCONTINUED] metoprolol tartrate (LOPRESSOR) 25 MG tablet Take 25 mg by mouth daily.   gabapentin (NEURONTIN) 300 MG capsule TAKE 2 CAPSULES BY MOUTH 3 TIMES A DAY (IN THE MORNING, MIDDAY AND AT BEDTIME).   metoprolol tartrate (LOPRESSOR) 25 MG tablet Take one tablet q am and 1/2 tablet q pm   No facility-administered encounter medications on file as of 10/16/2023.    Reconciled medication with pt.   Lab Results  Component Value Date   WBC 9.5 10/11/2023   HGB 11.4 (L) 10/11/2023   HCT 34.1 (L) 10/11/2023   PLT 212 10/11/2023   GLUCOSE 109 (H) 10/11/2023   CHOL 125 05/19/2023   TRIG 67.0 05/19/2023   HDL 54.00 05/19/2023   LDLCALC 58 05/19/2023   ALT 17 10/09/2023   AST 22 10/09/2023   NA  137 10/11/2023   K 4.3 10/11/2023   CL 100 10/11/2023   CREATININE 0.99 10/11/2023   BUN 28 (H) 10/11/2023   CO2 27 10/11/2023   TSH 0.468 10/09/2023   INR 0.89 08/06/2018   HGBA1C 6.0 10/19/2014    ECHOCARDIOGRAM COMPLETE Result Date: 10/11/2023    ECHOCARDIOGRAM REPORT   Patient Name:   [facility] Date of Exam: 10/11/2023 Medical Rec #:  [id number]   Height:       62.0 in Accession #:    [id number]  Weight:       173.9 lb Date of Birth:  12-16-1930    BSA:          1.802 m Patient Age:    89 years    BP:           154/88 mmHg Patient Gender: F           HR:           80 bpm. Exam Location:  [facility] Procedure: 2D Echo Indications:     Atrial Fibrillation I48.91  History:         Patient has prior history of Echocardiogram examinations, most                  recent 07/14/2022.  Sonographer:     Overton Mam RDCS, FASE Referring Phys:  [id number] Dorian Pod HUDSON Diagnosing Phys: Alwyn Pea MD  Sonographer Comments: Technically challenging study due to limited acoustic windows, no apical window and suboptimal  subcostal window. Image acquisition challenging due to patient body habitus and Image acquisition challenging due to respiratory motion. IMPRESSIONS  1. TDS.  2. Left ventricular ejection fraction, by estimation, is 55 to 60%. The left ventricle has normal function. The left ventricle has no regional wall motion abnormalities. Left ventricular diastolic function could not be evaluated.  3. Right ventricular systolic function is normal. The right ventricular size is normal.  4. Left atrial size was mild to moderately dilated.  5. Right atrial size was mildly dilated.  6. The mitral valve is normal in structure. No evidence of mitral valve regurgitation.  7. The aortic valve is grossly normal. Aortic valve regurgitation is not visualized. Conclusion(s)/Recommendation(s): Poor windows for evaluation of left ventricular function by transthoracic echocardiography. Would recommend an alternative means of evaluation. FINDINGS  Left Ventricle: Left ventricular ejection fraction, by estimation, is 55 to 60%. The left ventricle has normal function. The left ventricle has no regional wall motion abnormalities. The left ventricular internal cavity size was normal in size. There is  borderline left ventricular hypertrophy. Left ventricular diastolic function could not be evaluated. Right Ventricle: The right ventricular size is normal. No increase in right ventricular wall thickness. Right ventricular systolic function is normal. Left Atrium: Left atrial size was mild to moderately dilated. Right Atrium: Right atrial size was mildly dilated. Pericardium: There is no evidence of pericardial effusion. Mitral Valve: The mitral valve is normal in structure. No evidence of mitral valve regurgitation. Tricuspid Valve: The tricuspid valve is normal in structure. Tricuspid valve regurgitation is not demonstrated. Aortic Valve: The aortic valve is grossly normal. Aortic valve regurgitation is not visualized. Pulmonic Valve: The pulmonic  valve was normal in structure. Pulmonic valve regurgitation is not visualized. Aorta: The ascending aorta was not well visualized. IAS/Shunts: No atrial level shunt detected by color flow Doppler. Additional Comments: TDS.  LEFT VENTRICLE PLAX 2D LVIDd:         4.80 cm LVIDs:  3.30 cm LV PW:         1.20 cm LV IVS:        1.00 cm LVOT diam:     1.80 cm LVOT Area:     2.54 cm  LEFT ATRIUM         Index LA diam:    4.90 cm 2.72 cm/m                        PULMONIC VALVE AORTA                 PV Vmax:        0.93 m/s Ao Root diam: 3.10 cm PV Peak grad:   3.5 mmHg                       RVOT Peak grad: 4 mmHg  TRICUSPID VALVE TR Peak grad:   24.8 mmHg TR Vmax:        249.00 cm/s  SHUNTS Systemic Diam: 1.80 cm Alwyn Pea MD Electronically signed by Alwyn Pea MD Signature Date/Time: 10/11/2023/2:28:12 PM    Final        Assessment & Plan:  Hyperlipidemia, unspecified hyperlipidemia type Assessment & Plan: On lipitor.  Low cholesterol diet and exercise.  Follow lipid panel and liver function tests.    Orders: -     Lipid panel; Future -     Hepatic function panel; Future  Primary hypertension Assessment & Plan: Continues on amlodipine and benazepril. Also now on metoprolol. Adjust dose of metoprolol as outlined - for continued increased heart rate. Follow pressures.  Follow metabolic panel.   Orders: -     Basic metabolic panel; Future  Atrial fibrillation, unspecified type [facility]) Assessment & Plan: Recent admission - afib with RVR. Now on metoprolol and eliquis. Also on amiodarone. Ventricular rate today around 100. Will increase metoprolol to 1 tablet in am and add 1/2 tablet in pm. Follow pressures and pulse rate. May have to adjust metoprolol dosing more. Keep f/u with cardiology next week.   Orders: -     EKG 12-Lead  Aortic atherosclerosis (HCC) Assessment & Plan: Continue lipitor.    Mild intermittent asthma without complication Assessment & Plan: F/u pulmonary  05/2023 - continue symbicort.  Breathing stable.    Congestive heart failure, unspecified HF chronicity, unspecified heart failure type (HCC) Assessment & Plan: ECHO 60% with G1DD.     Chronic respiratory failure with hypoxia (HCC) Assessment & Plan: Uses oxygen at night.  Continue oxygen - nocturnal.  Breathing stable.    Gastroesophageal reflux disease, unspecified whether esophagitis present Assessment & Plan: Continue PPI. No increased upper GI symptoms.    Hypothyroidism, unspecified type Assessment & Plan: On thyroid replacement.  Follow tsh.    Leg swelling Assessment & Plan: Improved with compression hose.    Pulmonary hypertension (HCC) Assessment & Plan: Followed by pulmonary.  Continue nighttime oxygen.    Stress Assessment & Plan: Followed by psychiatry.  Continues on cymbalta.    Other orders -     Gabapentin; TAKE 2 CAPSULES BY MOUTH 3 TIMES A DAY (IN THE MORNING, MIDDAY AND AT BEDTIME).  Dispense: 540 capsule; Refill: 1 -     Metoprolol Tartrate; Take one tablet q am and 1/2 tablet q pm  Dispense: 45 tablet; Refill: 2     Dale Massillon, MD

## 2023-10-16 NOTE — Patient Instructions (Signed)
Metoprolol - take one tablet in am and 1/2 tablet in the evening.

## 2023-10-20 ENCOUNTER — Encounter: Payer: Self-pay | Admitting: Internal Medicine

## 2023-10-20 DIAGNOSIS — I272 Pulmonary hypertension, unspecified: Secondary | ICD-10-CM | POA: Diagnosis not present

## 2023-10-20 DIAGNOSIS — I5032 Chronic diastolic (congestive) heart failure: Secondary | ICD-10-CM | POA: Diagnosis not present

## 2023-10-20 DIAGNOSIS — I480 Paroxysmal atrial fibrillation: Secondary | ICD-10-CM | POA: Diagnosis not present

## 2023-10-20 DIAGNOSIS — I10 Essential (primary) hypertension: Secondary | ICD-10-CM | POA: Diagnosis not present

## 2023-10-20 NOTE — Assessment & Plan Note (Signed)
Followed by psychiatry.  Continues on cymbalta.  

## 2023-10-20 NOTE — Assessment & Plan Note (Signed)
Continue PPI. No increased upper GI symptoms.

## 2023-10-20 NOTE — Telephone Encounter (Signed)
The only form I have received is from [facility] for a oxygen concentrator. I have placed the from in your folder.   This message is from Thursday so I am not sure if this from has already been completed. I will route to both PCP & clinical pool for follow-up

## 2023-10-20 NOTE — Assessment & Plan Note (Signed)
Recent admission - afib with RVR. Now on metoprolol and eliquis. Also on amiodarone. Ventricular rate today around 100. Will increase metoprolol to 1 tablet in am and add 1/2 tablet in pm. Follow pressures and pulse rate. May have to adjust metoprolol dosing more. Keep f/u with cardiology next week.

## 2023-10-20 NOTE — Assessment & Plan Note (Signed)
Uses oxygen at night.  Continue oxygen - nocturnal.  Breathing stable.

## 2023-10-20 NOTE — Assessment & Plan Note (Signed)
 On lipitor.  Low cholesterol diet and exercise.  Follow lipid panel and liver function tests.

## 2023-10-20 NOTE — Assessment & Plan Note (Signed)
Continues on amlodipine and benazepril. Also now on metoprolol. Adjust dose of metoprolol as outlined - for continued increased heart rate. Follow pressures.  Follow metabolic panel.

## 2023-10-20 NOTE — Assessment & Plan Note (Signed)
 Continue lipitor  ?

## 2023-10-20 NOTE — Telephone Encounter (Signed)
Kimberly Roy, has this been addressed?  Let me know if we need to do anything more.

## 2023-10-20 NOTE — Assessment & Plan Note (Signed)
ECHO 60% with G1DD.

## 2023-10-20 NOTE — Assessment & Plan Note (Signed)
 On thyroid replacement.  Follow tsh.

## 2023-10-20 NOTE — Assessment & Plan Note (Signed)
 F/u pulmonary 05/2023 - continue symbicort.  Breathing stable.

## 2023-10-20 NOTE — Assessment & Plan Note (Signed)
 Followed by pulmonary.  Continue nighttime oxygen.

## 2023-10-20 NOTE — Assessment & Plan Note (Signed)
 Improved with compression hose.

## 2023-10-21 NOTE — Telephone Encounter (Signed)
Have made several attempts to connect with [facility]. Unaware of patient using "[facility]". Order I received was for a neb machine. No call back number to reach Vesta in message or on fax. Closing note.

## 2023-10-22 ENCOUNTER — Emergency Department
Admission: EM | Admit: 2023-10-22 | Discharge: 2023-10-22 | Disposition: A | Discharge status: Home / Self Care | Payer: Medicare Other | Attending: Emergency Medicine | Admitting: Emergency Medicine

## 2023-10-22 ENCOUNTER — Other Ambulatory Visit: Payer: Self-pay

## 2023-10-22 ENCOUNTER — Encounter: Payer: Self-pay | Admitting: Emergency Medicine

## 2023-10-22 ENCOUNTER — Emergency Department: Payer: Medicare Other

## 2023-10-22 DIAGNOSIS — I959 Hypotension, unspecified: Secondary | ICD-10-CM | POA: Diagnosis not present

## 2023-10-22 DIAGNOSIS — I4891 Unspecified atrial fibrillation: Secondary | ICD-10-CM | POA: Diagnosis not present

## 2023-10-22 DIAGNOSIS — I110 Hypertensive heart disease with heart failure: Secondary | ICD-10-CM | POA: Insufficient documentation

## 2023-10-22 DIAGNOSIS — R9389 Abnormal findings on diagnostic imaging of other specified body structures: Secondary | ICD-10-CM | POA: Diagnosis not present

## 2023-10-22 DIAGNOSIS — R000 Tachycardia, unspecified: Secondary | ICD-10-CM | POA: Diagnosis not present

## 2023-10-22 DIAGNOSIS — R0989 Other specified symptoms and signs involving the circulatory and respiratory systems: Secondary | ICD-10-CM | POA: Diagnosis not present

## 2023-10-22 DIAGNOSIS — I509 Heart failure, unspecified: Secondary | ICD-10-CM | POA: Insufficient documentation

## 2023-10-22 DIAGNOSIS — R531 Weakness: Secondary | ICD-10-CM | POA: Diagnosis not present

## 2023-10-22 LAB — CBC WITH DIFFERENTIAL/PLATELET
Abs Immature Granulocytes: 0.06 10*3/uL (ref 0.00–0.07)
Basophils Absolute: 0 10*3/uL (ref 0.0–0.1)
Basophils Relative: 0 %
Eosinophils Absolute: 0.4 10*3/uL (ref 0.0–0.5)
Eosinophils Relative: 6 %
HCT: 36 % (ref 36.0–46.0)
Hemoglobin: 11.6 g/dL — ABNORMAL LOW (ref 12.0–15.0)
Immature Granulocytes: 1 %
Lymphocytes Relative: 12 %
Lymphs Abs: 0.8 10*3/uL (ref 0.7–4.0)
MCH: 33.8 pg (ref 26.0–34.0)
MCHC: 32.2 g/dL (ref 30.0–36.0)
MCV: 105 fL — ABNORMAL HIGH (ref 80.0–100.0)
Monocytes Absolute: 0.5 10*3/uL (ref 0.1–1.0)
Monocytes Relative: 7 %
Neutro Abs: 5.3 10*3/uL (ref 1.7–7.7)
Neutrophils Relative %: 74 %
Platelets: 157 10*3/uL (ref 150–400)
RBC: 3.43 MIL/uL — ABNORMAL LOW (ref 3.87–5.11)
RDW: 13.7 % (ref 11.5–15.5)
WBC: 7.1 10*3/uL (ref 4.0–10.5)
nRBC: 0 % (ref 0.0–0.2)

## 2023-10-22 LAB — TROPONIN I (HIGH SENSITIVITY): Troponin I (High Sensitivity): 8 ng/L (ref <18)

## 2023-10-22 LAB — COMPREHENSIVE METABOLIC PANEL
ALT: 107 U/L — ABNORMAL HIGH (ref 0–44)
AST: 122 U/L — ABNORMAL HIGH (ref 15–41)
Albumin: 3.5 g/dL (ref 3.5–5.0)
Alkaline Phosphatase: 92 U/L (ref 38–126)
Anion gap: 11 (ref 5–15)
BUN: 25 mg/dL — ABNORMAL HIGH (ref 8–23)
CO2: 30 mmol/L (ref 22–32)
Calcium: 9.3 mg/dL (ref 8.9–10.3)
Chloride: 98 mmol/L (ref 98–111)
Creatinine, Ser: 1.19 mg/dL — ABNORMAL HIGH (ref 0.44–1.00)
GFR, Estimated: 43 mL/min — ABNORMAL LOW (ref >60)
Glucose, Bld: 87 mg/dL (ref 70–99)
Potassium: 4 mmol/L (ref 3.5–5.1)
Sodium: 139 mmol/L (ref 135–145)
Total Bilirubin: 1 mg/dL (ref 0.0–1.2)
Total Protein: 6.5 g/dL (ref 6.5–8.1)

## 2023-10-22 LAB — BRAIN NATRIURETIC PEPTIDE: B Natriuretic Peptide: 420.6 pg/mL — ABNORMAL HIGH (ref 0.0–100.0)

## 2023-10-22 LAB — MAGNESIUM: Magnesium: 2 mg/dL (ref 1.7–2.4)

## 2023-10-22 NOTE — Discharge Instructions (Signed)
You are seen in the ER today for your weakness.  Your testing fortunately did not show an emergency cause for this.  Keep your scheduled follow-up with your outpatient doctors.  Return to the ER for new or worsening symptoms.

## 2023-10-22 NOTE — ED Provider Notes (Signed)
[facility] Provider Note    Event Date/Time   First MD Initiated Contact with Patient 10/22/23 1230     (approximate)   History   Weakness and Irregular Heart Beat   HPI  Kimberly Roy is a 89 year old female with history of CHF, SVT, A-fib with RVR, HTN presenting to the emergency department for evaluation of weakness.  Patient with recent hospital admission, reports recently she has felt generally weak since then.  Had a visitor she had forgotten about today, so was walking quickly and began to feel short of breath.  O2 sat of 89% on room air when they checked, does wear oxygen as needed at night.  Does feel improved now after resting.  I reviewed her discharge summary from 10/12/2023.  At that time, patient presented with irregular heart rate found to be in new onset A-fib with RVR.  She was placed on Eliquis and oral amiodarone at the time of discharge.  Also noted to have elevated BNP and was diuresed.  She saw her cardiology office yesterday where she was found to have atrial flutter with a heart rate of 113.  Her metoprolol and Eliquis were increased and she was placed on a Holter monitor with consideration for cardioversion based on results.      Physical Exam   Triage Vital Signs: ED Triage Vitals  Encounter Vitals Group     BP 10/22/23 1231 (!) 105/56     Systolic BP Percentile --      Diastolic BP Percentile --      Pulse Rate 10/22/23 1231 (!) 105     Resp 10/22/23 1231 18     Temp 10/22/23 1231 98.5 F (36.9 C)     Temp Source 10/22/23 1231 Oral     SpO2 10/22/23 1231 96 %     Weight 10/22/23 1232 173 lb (78.5 kg)     Height 10/22/23 1232 5\' 2"  (1.575 m)     Head Circumference --      Peak Flow --      Pain Score 10/22/23 1233 0     Pain Loc --      Pain Education --      Exclude from Growth Chart --     Most recent vital signs: Vitals:   10/22/23 1400 10/22/23 1500  BP: 106/78 106/66  Pulse: (!) 105 (!) 102  Resp: (!) 21 19   Temp:    SpO2: 94% 94%     General: Awake, interactive  CV:  Irregularly irregular rhythm, heart rate ranging from the 90s to 110s, good peripheral perfusion Resp:  Unlabored respirations, lungs clear to auscultation Abd:  Nondistended, soft, nontender to palpation Neuro:  Symmetric facial movement, fluid speech   ED Results / Procedures / Treatments   Labs (all labs ordered are listed, but only abnormal results are displayed) Labs Reviewed  CBC WITH DIFFERENTIAL/PLATELET - Abnormal; Notable for the following components:      Result Value   RBC 3.43 (*)    Hemoglobin 11.6 (*)    MCV 105.0 (*)    All other components within normal limits  COMPREHENSIVE METABOLIC PANEL - Abnormal; Notable for the following components:   BUN 25 (*)    Creatinine, Ser 1.19 (*)    AST 122 (*)    ALT 107 (*)    GFR, Estimated 43 (*)    All other components within normal limits  BRAIN NATRIURETIC PEPTIDE - Abnormal; Notable for the following components:  B Natriuretic Peptide 420.6 (*)    All other components within normal limits  MAGNESIUM  TROPONIN I (HIGH SENSITIVITY)     EKG EKG independently reviewed interpreted by myself (ER attending) demonstrates:  EKG demonstrates A-fib at a rate of 106, QRS 150, QTc 497, no acute ST changes  RADIOLOGY Imaging independently reviewed and interpreted by myself demonstrates:  CXR without focal consolidation  PROCEDURES:  Critical Care performed: No  Procedures   MEDICATIONS ORDERED IN ED: Medications - No data to display   IMPRESSION / MDM / ASSESSMENT AND PLAN / ED COURSE  I reviewed the triage vital signs and the nursing notes.  Differential diagnosis includes, but is not limited to, arrhythmia, anemia, electrolyte abnormality, pneumonia, ACS  Patient's presentation is most consistent with acute presentation with potential threat to life or bodily function.  89 year old female presenting with weakness found to be in A-fib with RVR  with heart rates up to the 110s.  Will obtain labs, EKG, x-Syriana Croslin to further evaluate.  Heart rates not persistently significantly elevated, will hold off on rate control for now.  Labs with stable anemia, mild renal dysfunction.  BNP slightly elevated but not significantly changed from recent priors.  Troponin negative.  Chest x-Makynzi Eastland without acute findings.  On reevaluation, patient eager to be discharged home.  I did discuss further workup and possible admission given age and risk factors, but patient strongly prefers discharge home and given overall stable workup, do think this is reasonable.  Her heart rate improved to the 90s on reevaluation.  Do note that she was elevated in the 110s yesterday in the office, do think discharge with continued medication management is reasonable.  Strict return precautions provided.  Patient discharged stable condition.      FINAL CLINICAL IMPRESSION(S) / ED DIAGNOSES   Final diagnoses:  Generalized weakness     Rx / DC Orders   ED Discharge Orders     None        Note:  This document was prepared using Dragon voice recognition software and may include unintentional dictation errors.   Trinna Post, MD 10/22/23 [phone]

## 2023-10-22 NOTE — ED Notes (Signed)
 Patient discharged from ED by provider. Discharge instructions reviewed with patient and all questions answered. Patient wheeled from ED in NAD.

## 2023-10-22 NOTE — ED Notes (Signed)
Called CCMD at [phone] to initiate cardiac monitoring.

## 2023-10-22 NOTE — ED Triage Notes (Addendum)
Patient presents to ED via [facility] from home for irregular heart beat and weakness.   Patient just recently admitted for same. During her follow-up visit yesterday, her cardiologist increased her metoprolol dose and placed her on holter monitor.

## 2023-10-23 ENCOUNTER — Telehealth: Payer: Self-pay

## 2023-10-23 NOTE — Transitions of Care (Post Inpatient/ED Visit) (Signed)
10/23/2023  Name: Kimberly Roy MRN: [id number] DOB: 1930-12-11  Today's TOC FU Call Status: Today's TOC FU Call Status:: Successful TOC FU Call Completed TOC FU Call Complete Date: 10/23/23 Patient's Name and Date of Birth confirmed.  Transition Care Management Follow-up Telephone Call Date of Discharge: 10/22/23 Discharge Facility: [facility]) Type of Discharge: Emergency Department Reason for ED Visit: Other: (weakness) How have you been since you were released from the hospital?: Better Any questions or concerns?: No  Items Reviewed: Did you receive and understand the discharge instructions provided?: Yes Medications obtained,verified, and reconciled?: Yes (Medications Reviewed) Any new allergies since your discharge?: No Dietary orders reviewed?: Yes Do you have support at home?: Yes Name of Support/Comfort Primary Source: caregiver  Medications Reviewed Today: Medications Reviewed Today     Reviewed by Karena Addison, LPN (Licensed Practical Nurse) on 10/23/23 at 1226  Med List Status: <None>   Medication Order Taking? Sig Documenting Provider Last Dose Status Informant  albuterol (VENTOLIN HFA) 108 (90 Base) MCG/ACT inhaler [id number] No INHALE 2 PUFFS INTO THE LUNGS EVERY 6 HOURS AS NEEDED FOR WHEEZING OR SHORTNESS OF Wandra Scot, MD Taking Active Child, Multiple Informants, Pharmacy Records  aluminum-magnesium hydroxide-simethicone (MAALOX) 200-200-20 MG/5ML SUSP [id number] No Take 15 mLs by mouth 4 (four) times daily -  before meals and at bedtime.  [provider] Taking Active Child, Multiple Informants, Pharmacy Records           Med Note Nelia Shi   Fri Jul 12, 2022  1:32 AM)    amiodarone (PACERONE) 200 MG tablet [id number] No Take 1 tablet (200 mg total) by mouth daily. Marcelino Duster, MD Taking Active   amLODipine (NORVASC) 10 MG tablet [id number] No Take 1 tablet (10 mg total) by mouth daily. Dale [place], MD Taking Active Child, Multiple Informants, Pharmacy Records  apixaban (ELIQUIS) 2.5 MG TABS tablet [id number] No Take 1 tablet (2.5 mg total) by mouth 2 (two) times daily. Marcelino Duster, MD Taking Active   atorvastatin (LIPITOR) 20 MG tablet [id number] No TAKE 1 TABLET BY MOUTH AT BEDTIME Dale [place], MD Taking Active Child, Multiple Informants, Pharmacy Records  benazepril (LOTENSIN) 40 MG tablet [id number] No Take 1 tablet (40 mg total) by mouth daily. Dale [place], MD Taking Active Child, Multiple Informants, Pharmacy Records  betamethasone dipropionate 0.05 % cream [id number] No Apply 1 application topically 2 (two) times daily. [provider] Taking Active Child, Multiple Informants, Pharmacy Records           Med Note Nelia Shi   Fri Jul 12, 2022  1:32 AM)    budesonide-formoterol [facility]) 80-4.5 MCG/ACT inhaler [id number] No Inhale 2 puffs into the lungs in the morning and at bedtime. [name], MD Taking Active Child, Multiple Informants, Pharmacy Records  busPIRone (BUSPAR) 10 MG tablet [id number] No Take 10 mg by mouth 3 (three) times daily. [provider] Taking Active Child, Multiple Informants, Pharmacy Records  calcium citrate-vitamin D (CITRACAL+D) 315-200 MG-UNIT tablet [id number] No Take 2 tablets by mouth daily. [provider] Taking Active Child, Multiple Informants, Pharmacy Records  Cholecalciferol (VITAMIN D3) 1000 UNITS CAPS [id number] No Take 1 capsule by mouth daily. [provider] Taking Active Child, Multiple Informants, Pharmacy Records  clobetasol cream (TEMOVATE) 0.05 % [id number] No Apply 1 application topically See admin instructions. Apply to affected areas of body 1 - 2 times daily as needed for itchy bumps. Avoid applying to face, groin, and axilla. Use as  directed. Willeen Niece, MD Taking Active Child, Multiple Informants, Pharmacy Records  cyanocobalamin (VITAMIN B12) 1000 MCG/ML injection  [id number] No INJECT 1 ML INTO THE MUSCLE EVERY 3 MONTHS. [name], MD Taking Active Child, Multiple Informants, Pharmacy Records  Diclofenac Sodium 3 % GEL [id number] No Apply topically 2 (two) times daily. [provider] Taking Active Child, Multiple Informants, Pharmacy Records           Med Note Aundria Rud, Landry Dyke   Fri Jul 12, 2022  1:32 AM)    Docusate Sodium (DSS) 100 MG CAPS [id number] No Take 100 mg by mouth 2 (two) times daily. [provider] Taking Active Child, Multiple Informants, Pharmacy Records  DULoxetine (CYMBALTA) 20 MG capsule [id number] No Take 20 mg by mouth daily. [provider] Taking Active Child, Multiple Informants, Pharmacy Records  DULoxetine (CYMBALTA) 60 MG capsule [id number] No Take 60 mg by mouth daily. [provider] Taking Active Child, Multiple Informants, Pharmacy Records  Fluocinolone Acetonide 0.01 % OIL [id number] No Apply 1-2 drops into ears once to twice daily as needed. Willeen Niece, MD Taking Active Child, Multiple Informants, Pharmacy Records  fluocinonide (LIDEX) 0.05 % external solution [id number] No Apply topically 2 (two) times daily. Apply drops to scalp for itch Willeen Niece, MD Taking Active Child, Multiple Informants, Pharmacy Records  fluticasone [facility]) 50 MCG/ACT nasal spray [id number] No Place 2 sprays into both nostrils daily. Dale [place], MD Taking Active Child, Multiple Informants, Pharmacy Records  furosemide (LASIX) 20 MG tablet [id number] No TAKE 1 TABLET BY MOUTH DAILY AS NEEDED  Patient taking differently: Take 20 mg by mouth daily.   Dale [place], MD Taking Active Child, Multiple Informants, Pharmacy Records           Med Note Aundria Rud, Landry Dyke   Fri Oct 10, 2023  8:57 AM)    gabapentin (NEURONTIN) 300 MG capsule [id number]  TAKE 2 CAPSULES BY MOUTH 3 TIMES A DAY (IN THE MORNING, MIDDAY AND AT BEDTIME). Dale [place], MD  Active   HYDROcodone-acetaminophen [facility]) 10-325 MG  tablet [id number] No Take 1 tablet by mouth 3 (three) times daily. Enedina Finner, MD Taking Active Child, Multiple Informants, Pharmacy Records  [facility]) [place] [id number] No Apply topically as directed (as discussed). Dale [place], MD Taking Active Child, Multiple Informants, Pharmacy Records  ipratropium (ATROVENT) 0.03 % nasal spray [id number] No Place 2 sprays into both nostrils every 12 (twelve) hours. Dale [place], MD Taking Active Child, Multiple Informants, Pharmacy Records  ketoconazole (NIZORAL) 2 % shampoo [id number] No Apply 1 Application topically See admin instructions. apply three times per week, massage into scalp and leave in for 10 minutes before rinsing out Willeen Niece, MD Taking Active Child, Multiple Informants, Pharmacy Records  levalbuterol [facility]) 0.63 MG/3ML nebulizer solution [id number] No Take 3 mLs (0.63 mg total) by nebulization every 6 (six) hours as needed for wheezing or shortness of breath. Dale [place], MD Taking Active Child, Multiple Informants, Pharmacy Records  LINZESS 290 MCG CAPS capsule [id number] No Take 290 mcg by mouth daily. [provider] Taking Active Child, Multiple Informants, Pharmacy Records  magnesium oxide (MAG-OX) 400 MG tablet [id number] No Take 1 tablet (400 mg total) by mouth daily. Dale [place], MD Taking Active Child, Multiple Informants, Pharmacy Records  metoprolol tartrate (LOPRESSOR) 25 MG tablet [id number]  Take one tablet q am and 1/2 tablet q pm Dale [place], MD  Active   morphine (MS CONTIN) 30 MG 12 hr tablet [id number] No Take 30 mg  by mouth daily. 1400 [provider] Taking Active Child, Multiple Informants, Pharmacy Records  morphine (MS CONTIN) 60 MG 12 hr tablet [id number] No Take 1 tablet (60 mg total) by mouth 2 (two) times daily. Enedina Finner, MD Taking Active Child, Multiple Informants, Pharmacy Records  mupirocin ointment (BACTROBAN) 2 % [id number] No Apply 1 Application topically  daily. Qd to excision site Willeen Niece, MD Taking Active Child, Multiple Informants, Pharmacy Records  nystatin (MYCOSTATIN/NYSTOP) powder [id number] No Apply 1 application. topically 2 (two) times daily as needed. Dale [place], MD Taking Active Child, Multiple Informants, Pharmacy Records  nystatin cream (MYCOSTATIN) [id number] No Apply 1 Application topically 2 (two) times daily. Dale [place], MD Taking Active Child, Multiple Informants, Pharmacy Records  pantoprazole (PROTONIX) 40 MG tablet [id number] No Take 1 tablet (40 mg total) by mouth 2 (two) times daily. Dale [place], MD Taking Active Child, Multiple Informants, Pharmacy Records  polyethylene glycol powder (GLYCOLAX/MIRALAX) 17 GM/SCOOP powder [id number] No MIX 17 GRAMS AS MARKED ON BOTTLE TOP IN 8 OUNCES OF WATER AND DRINK ONCE A DAY AS DIRECTED. Dale [place], MD Taking Active Child, Multiple Informants, Pharmacy Records  predniSONE (DELTASONE) 10 MG tablet [id number] No Taper dose 60mg x2 days/50mg x2 days/40mg x2 days/30mg x2 days/20mg x2 days/10mg x2 days, stop Pt started 10/07/23 [provider] Taking Active Child, Multiple Informants, Pharmacy Records           Med Note Nelia Shi   Fri Oct 10, 2023  9:10 AM) Pt started 10/07/2023  Simethicone (GAS-X PO) [id number] No Take 2 tablets by mouth 2 (two) times daily. [provider] Taking Active Child, Multiple Informants, Pharmacy Records  sodium chloride (OCEAN) 0.65 % nasal spray [id number] No Place 1 spray into the nose as needed. [provider] Taking Active Child, Multiple Informants, Pharmacy Records  SYNTHROID 100 MCG tablet [id number] No TAKE 1 TABLET BY MOUTH DAILY Dale [place], MD Taking Active Child, Multiple Informants, Pharmacy Records  triamcinolone ointment (KENALOG) 0.1 % [id number] No APPLY TWICE DAILY TO BITES AND RASH UNTIL FLAT AND SMOOTH **DO NOT APPLY TO FACEDale [place], MD Taking Active Child, Multiple Informants, Pharmacy  Records            Home Care and Equipment/Supplies: Were Home Health Services Ordered?: NA Any new equipment or medical supplies ordered?: NA  Functional Questionnaire: Do you need assistance with bathing/showering or dressing?: No Do you need assistance with meal preparation?: No Do you need assistance with eating?: No Do you have difficulty maintaining continence: No Do you need assistance with getting out of bed/getting out of a chair/moving?: No Do you have difficulty managing or taking your medications?: No  Follow up appointments reviewed: PCP Follow-up appointment confirmed?: Yes Date of PCP follow-up appointment?: 11/03/23 Follow-up Provider: [facility] Follow-up appointment confirmed?: NA Do you need transportation to your follow-up appointment?: No Do you understand care options if your condition(s) worsen?: Yes-patient verbalized understanding    SIGNATURE Karena Addison, LPN [facility] Nurse Health Advisor Direct Dial [phone]

## 2023-10-29 ENCOUNTER — Telehealth: Payer: Self-pay

## 2023-10-29 ENCOUNTER — Ambulatory Visit: Payer: Self-pay | Admitting: Internal Medicine

## 2023-10-29 ENCOUNTER — Other Ambulatory Visit: Payer: Self-pay

## 2023-10-29 DIAGNOSIS — R300 Dysuria: Secondary | ICD-10-CM

## 2023-10-29 NOTE — Telephone Encounter (Signed)
 Spoke with Dr. Darrick Huntsman about pt and symptoms. Dr. Darrick Huntsman said that it would be okay for pt to wait until tomorrow to have her urine checked. I have placed the pt on the lab schedule and ordered the labs. The daughter would still like to do a virtual visit so I'm not sure if there is somewhere that you would like to work her in or if it would be okay to put her on someone else's schedule for the virtual. The daughter is aware that she can drop the urine off anytime tomorrow and that we will call her tomorrow to schedule a virtual visit with them.

## 2023-10-29 NOTE — Telephone Encounter (Signed)
 Chief Complaint: confusion Symptoms: memory loss, abd pain, fatigue Frequency: About 1 week Pertinent Negatives: Patient denies fever Disposition: [] ED /[] Urgent Care (no appt availability in office) / [x] Appointment(In office/virtual)/ []  [place] [facility]/ [] Home Care/ [] Refused Recommended Disposition /[] [place] [facility]/ []  Follow-up with PCP Additional Notes:Pt's daughter reports pt is being followed by Cardiology and has recently started to decline, pt's dtr was advised by Cardio to call PCP for urinalysis as pt is exhibiting increased confusion, memory loss, fatigue and weakness for the last week. Denies fever, V/D. Pt is currently requiring a higher oxygen need and does not currently have portable oxygen available to come to an in person appt. Spoke to [place] from [place] who advised to send HP msg for nurse/provider to review. Please call Dtr Gavin Pound at [phone]. She reports they do have urine cups at pt's home if sample can be brought in and virtual visit scheduled. This RN educated pt on home care, new-worsening symptoms, when to call back/seek emergent care. Pt verbalized understanding and agrees to plan.    Copied from CRM [id number]. Topic: Clinical - Red Word Triage >> Oct 29, 2023  4:33 PM Sim Boast F wrote: Red Word that prompted transfer to Nurse Triage: Possibly UTI, fatigue, stomach pain, confusion, hallucinations, weak and memory loss. Cardiologist recommended patient to have urine checked. Reason for Disposition . [1] Acting confused (e.g., disoriented, slurred speech) AND [2] brief (now gone)  Answer Assessment - Initial Assessment Questions 1. LEVEL OF CONSCIOUSNESS: "How is he (she, the patient) acting right now?" (e.g., alert-oriented, confused, lethargic, stuporous, comatose)     Confused 2. ONSET: "When did the confusion start?"  (minutes, hours, days)     Declined significantly last week 3. PATTERN "Does this come and go, or has it been constant since it  started?"  "Is it present now?"     Constant 6. CAUSE: "What do you think is causing the confusion?"      Poss UTI, advised to call PCP per Cardiologist 7. OTHER SYMPTOMS: "Are there any other symptoms?" (e.g., difficulty breathing, headache, fever, weakness)     Memory loss, fatigue, abd pain, memory loss  Protocols used: Confusion - Delirium-A-AH

## 2023-10-29 NOTE — Telephone Encounter (Signed)
 Copied from CRM [id number]. Topic: General - Other >> Oct 29, 2023  1:27 PM Denese Killings wrote: Reason for CRM: Kimberly Roy Patient's care giver stated that patient is trying to switch oxygen companies because she changed insurance. Oxygen company is requesting clinical notes and a prescription for oxygen.  Fax number is [phone]

## 2023-10-30 ENCOUNTER — Ambulatory Visit: Payer: Medicare HMO | Admitting: Neurology

## 2023-10-30 ENCOUNTER — Other Ambulatory Visit (INDEPENDENT_AMBULATORY_CARE_PROVIDER_SITE_OTHER): Payer: Medicare Other

## 2023-10-30 DIAGNOSIS — R300 Dysuria: Secondary | ICD-10-CM | POA: Diagnosis not present

## 2023-10-30 LAB — URINALYSIS, ROUTINE W REFLEX MICROSCOPIC
Bilirubin Urine: NEGATIVE
Hgb urine dipstick: NEGATIVE
Ketones, ur: NEGATIVE
Leukocytes,Ua: NEGATIVE
Nitrite: NEGATIVE
RBC / HPF: NONE SEEN (ref 0–?)
Specific Gravity, Urine: <=1.005 — AB (ref 1.000–1.030)
Total Protein, Urine: NEGATIVE
Urine Glucose: NEGATIVE
Urobilinogen, UA: 0.2 (ref 0.0–1.0)
pH: 6 (ref 5.0–8.0)

## 2023-10-30 NOTE — Telephone Encounter (Signed)
 Patient gets her oxygen from pulmonology. I have duplicate messages on this patient will let family know when I call them regarding other messages.

## 2023-10-30 NOTE — Telephone Encounter (Signed)
 Pt scheduled with Dr. Nicki Reaper tomorrow

## 2023-10-31 ENCOUNTER — Other Ambulatory Visit: Payer: Self-pay | Admitting: Dermatology

## 2023-10-31 ENCOUNTER — Telehealth (INDEPENDENT_AMBULATORY_CARE_PROVIDER_SITE_OTHER): Payer: Medicare Other | Admitting: Internal Medicine

## 2023-10-31 DIAGNOSIS — L219 Seborrheic dermatitis, unspecified: Secondary | ICD-10-CM

## 2023-10-31 DIAGNOSIS — R300 Dysuria: Secondary | ICD-10-CM | POA: Diagnosis not present

## 2023-10-31 DIAGNOSIS — I4891 Unspecified atrial fibrillation: Secondary | ICD-10-CM

## 2023-10-31 LAB — URINE CULTURE
MICRO NUMBER:: 16138052
SPECIMEN QUALITY:: ADEQUATE

## 2023-10-31 MED ORDER — FOSFOMYCIN TROMETHAMINE 3 G PO PACK: 3.0000 g | PACK | Freq: Once | ORAL | 0 refills | Status: AC

## 2023-10-31 NOTE — Progress Notes (Signed)
 Patient ID: Kimberly Roy, female   DOB: December 17, 1930, 89 y.o.   MRN: [id number]   Virtual Visit via video Note  I connected with Kimberly Roy by a video enabled telemedicine application and verified that I am speaking with the correct person using two identifiers. Location patient: home Location provider: work Persons participating in the virtual visit: patient, provider and patients daughter - Kimberly Roy.   The limitations, risks, security and privacy concerns of performing an evaluation and management service by video and the availability of in person appointments have been discussed. It has also been discussed with the patient that there may be a patient responsible charge related to this service. The patient expressed understanding and agreed to proceed.   Reason for visit: work in appt.   HPI: Reports symptoms started four days ago. Noticed some burning and discomfort with urination. States unable to tell if symptoms are from an infection. No vomiting. No fever. No increased abdominal pain. Daughter reported some question of confusion. She was able to answer questions appropriately today. She has an appt with me in a few days. Daughter reports some things wanted to follow up on at her upcoming appt. Has noticed some jerking - elbow down to hand. Already has an appt with neurology. Also reports some dysphagia - food stopping when eats. Discussed eating slowly, taking small bites and chewing food well. Also has been adjusting lasix. Weighing daily. Discussed monitoring sodium intake. Breathing currently stable.    ROS: See pertinent positives and negatives per HPI.  Past Medical History:  Diagnosis Date   Anemia    Anxiety    Chest pain    CHF (congestive heart failure) (HCC)    Constipation    DDD (degenerative disc disease), cervical    Depression    DVT (deep venous thrombosis) (HCC)    Dysphonia    Dyspnea    Fatty liver    Fatty liver    Headache    Hyperlipidemia    Hyperpiesia     Hypertension    Hypothyroidism    Interstitial cystitis    Left ventricular dysfunction    Lymphedema    Nephrolithiasis    Obstructive sleep apnea    Osteoarthritis    knees/cervical and lumbar spine   Pulmonary hypertension (HCC)    Pulmonary nodules    followed by Dr Meredeth Ide   Pure hypercholesterolemia    Renal cyst    right    Past Surgical History:  Procedure Laterality Date   ABDOMINAL HYSTERECTOMY     ovaries left in place   APPENDECTOMY     Back Surgeries     BACK SURGERY     BREAST REDUCTION SURGERY     3/99   CARDIAC CATHETERIZATION     cataracts Bilateral    CERVICAL SPINE SURGERY     ESOPHAGEAL MANOMETRY N/A 08/02/2015   Procedure: ESOPHAGEAL MANOMETRY (EM);  Surgeon: Elnita Maxwell, MD;  Location: [facility] ENDOSCOPY;  Service: Endoscopy;  Laterality: N/A;   ESOPHAGOGASTRODUODENOSCOPY N/A 02/27/2015   Procedure: ESOPHAGOGASTRODUODENOSCOPY (EGD);  Surgeon: Wallace Cullens, MD;  Location: [facility] ENDOSCOPY;  Service: Gastroenterology;  Laterality: N/A;   ESOPHAGOGASTRODUODENOSCOPY (EGD) WITH PROPOFOL N/A 10/30/2018   Procedure: ESOPHAGOGASTRODUODENOSCOPY (EGD) WITH PROPOFOL;  Surgeon: Christena Deem, MD;  Location: [facility] ENDOSCOPY;  Service: Endoscopy;  Laterality: N/A;   EXCISIONAL HEMORRHOIDECTOMY     EYE SURGERY     FRACTURE SURGERY     HEMORRHOID SURGERY     HIP SURGERY  2013  Right hip surgery   JOINT REPLACEMENT     KNEE ARTHROSCOPY     left and right   ORIF FEMUR FRACTURE Right 08/07/2018   Procedure: OPEN REDUCTION INTERNAL FIXATION (ORIF) DISTAL FEMUR FRACTURE;  Surgeon: Kennedy Bucker, MD;  Location: [facility] ORS;  Service: Orthopedics;  Laterality: Right;   REDUCTION MAMMAPLASTY Bilateral YRS AGO   REPLACEMENT TOTAL KNEE Bilateral    rotator cuff surgery     blilateral   TONSILECTOMY/ADENOIDECTOMY WITH MYRINGOTOMY     VISCERAL ARTERY INTERVENTION N/A 02/25/2019   Procedure: VISCERAL ARTERY INTERVENTION;  Surgeon: Annice Needy, MD;  Location: [facility] INVASIVE  CV LAB;  Service: Cardiovascular;  Laterality: N/A;    Family History  Problem Relation Age of Onset   Heart disease Mother    Stroke Mother    Hypertension Mother    Heart disease Father        myocardial infarction age 34   Breast cancer Neg Hx     SOCIAL HX: reviewed.    Current Outpatient Medications:    apixaban (ELIQUIS) 5 MG TABS tablet, Take 5 mg by mouth 2 (two) times daily., Disp: , Rfl:    metoprolol succinate (TOPROL-XL) 25 MG 24 hr tablet, Take 25 mg by mouth daily., Disp: , Rfl:    albuterol (VENTOLIN HFA) 108 (90 Base) MCG/ACT inhaler, INHALE 2 PUFFS INTO THE LUNGS EVERY 6 HOURS AS NEEDED FOR WHEEZING OR SHORTNESS OF BREATH, Disp: 18 g, Rfl: 0   aluminum-magnesium hydroxide-simethicone (MAALOX) 200-200-20 MG/5ML SUSP, Take 15 mLs by mouth 4 (four) times daily -  before meals and at bedtime. , Disp: , Rfl:    amiodarone (PACERONE) 200 MG tablet, Take 1 tablet (200 mg total) by mouth daily., Disp: 60 tablet, Rfl: 2   amLODipine (NORVASC) 10 MG tablet, Take 1 tablet (10 mg total) by mouth daily., Disp: 90 tablet, Rfl: 1   atorvastatin (LIPITOR) 20 MG tablet, TAKE 1 TABLET BY MOUTH AT BEDTIME, Disp: 90 tablet, Rfl: 1   benazepril (LOTENSIN) 40 MG tablet, Take 1 tablet (40 mg total) by mouth daily., Disp: 90 tablet, Rfl: 1   betamethasone dipropionate 0.05 % cream, Apply 1 application topically 2 (two) times daily., Disp: , Rfl:    budesonide-formoterol (SYMBICORT) 80-4.5 MCG/ACT inhaler, Inhale 2 puffs into the lungs in the morning and at bedtime., Disp: 1 each, Rfl: 12   busPIRone (BUSPAR) 10 MG tablet, Take 10 mg by mouth 3 (three) times daily., Disp: , Rfl:    calcium citrate-vitamin D (CITRACAL+D) 315-200 MG-UNIT tablet, Take 2 tablets by mouth daily., Disp: , Rfl:    Cholecalciferol (VITAMIN D3) 1000 UNITS CAPS, Take 1 capsule by mouth daily., Disp: , Rfl:    clobetasol cream (TEMOVATE) 0.05 %, Apply 1 application topically See admin instructions. Apply to affected areas  of body 1 - 2 times daily as needed for itchy bumps. Avoid applying to face, groin, and axilla. Use as directed., Disp: 60 g, Rfl: 1   cyanocobalamin (VITAMIN B12) 1000 MCG/ML injection, INJECT 1 ML INTO THE MUSCLE EVERY 3 MONTHS., Disp: 12 mL, Rfl: 0   Diclofenac Sodium 3 % GEL, Apply topically 2 (two) times daily., Disp: , Rfl:    Docusate Sodium (DSS) 100 MG CAPS, Take 100 mg by mouth 2 (two) times daily., Disp: , Rfl:    DULoxetine (CYMBALTA) 20 MG capsule, Take 20 mg by mouth daily., Disp: , Rfl:    DULoxetine (CYMBALTA) 60 MG capsule, Take 60 mg by mouth daily., Disp: ,  Rfl:    Fluocinolone Acetonide 0.01 % OIL, Apply 1-2 drops into ears once to twice daily as needed., Disp: 20 mL, Rfl: 3   fluocinonide (LIDEX) 0.05 % external solution, Apply topically 2 (two) times daily. Apply drops to scalp for itch, Disp: 60 mL, Rfl: 3   fluticasone (FLONASE) 50 MCG/ACT nasal spray, Place 2 sprays into both nostrils daily., Disp: 16 g, Rfl: 6   furosemide (LASIX) 20 MG tablet, TAKE 1 TABLET BY MOUTH DAILY AS NEEDED (Patient taking differently: Take 20 mg by mouth daily.), Disp: 90 tablet, Rfl: 0   gabapentin (NEURONTIN) 300 MG capsule, TAKE 2 CAPSULES BY MOUTH 3 TIMES A DAY (IN THE MORNING, MIDDAY AND AT BEDTIME)., Disp: 540 capsule, Rfl: 1   HYDROcodone-acetaminophen (NORCO) 10-325 MG tablet, Take 1 tablet by mouth 3 (three) times daily., Disp: 10 tablet, Rfl: 0   Infant Care Products (DERMACLOUD) OINT, Apply topically as directed (as discussed)., Disp: 430 g, Rfl: 1   ipratropium (ATROVENT) 0.03 % nasal spray, Place 2 sprays into both nostrils every 12 (twelve) hours., Disp: 30 mL, Rfl: 1   ketoconazole (NIZORAL) 2 % shampoo, Apply 1 Application topically See admin instructions. apply three times per week, massage into scalp and leave in for 10 minutes before rinsing out, Disp: 120 mL, Rfl: 3   levalbuterol (XOPENEX) 0.63 MG/3ML nebulizer solution, Take 3 mLs (0.63 mg total) by nebulization every 6 (six)  hours as needed for wheezing or shortness of breath., Disp: 3 mL, Rfl: 1   LINZESS 290 MCG CAPS capsule, Take 290 mcg by mouth daily., Disp: , Rfl:    magnesium oxide (MAG-OX) 400 MG tablet, Take 1 tablet (400 mg total) by mouth daily., Disp: 30 tablet, Rfl: 1   morphine (MS CONTIN) 30 MG 12 hr tablet, Take 30 mg by mouth daily. 1400, Disp: , Rfl:    morphine (MS CONTIN) 60 MG 12 hr tablet, Take 1 tablet (60 mg total) by mouth 2 (two) times daily., Disp: 10 tablet, Rfl: 0   mupirocin ointment (BACTROBAN) 2 %, Apply 1 Application topically daily. Qd to excision site, Disp: 22 g, Rfl: 1   nystatin (MYCOSTATIN/NYSTOP) powder, Apply 1 application. topically 2 (two) times daily as needed., Disp: 60 g, Rfl: 0   nystatin cream (MYCOSTATIN), Apply 1 Application topically 2 (two) times daily., Disp: 60 g, Rfl: 0   pantoprazole (PROTONIX) 40 MG tablet, Take 1 tablet (40 mg total) by mouth 2 (two) times daily., Disp: 180 tablet, Rfl: 1   polyethylene glycol powder (GLYCOLAX/MIRALAX) 17 GM/SCOOP powder, MIX 17 GRAMS AS MARKED ON BOTTLE TOP IN 8 OUNCES OF WATER AND DRINK ONCE A DAY AS DIRECTED., Disp: 527 g, Rfl: 0   predniSONE (DELTASONE) 10 MG tablet, Taper dose 60mg x2 days/50mg x2 days/40mg x2 days/30mg x2 days/20mg x2 days/10mg x2 days, stop Pt started 10/07/23, Disp: , Rfl:    Simethicone (GAS-X PO), Take 2 tablets by mouth 2 (two) times daily., Disp: , Rfl:    sodium chloride (OCEAN) 0.65 % nasal spray, Place 1 spray into the nose as needed., Disp: , Rfl:    SYNTHROID 100 MCG tablet, TAKE 1 TABLET BY MOUTH DAILY, Disp: 90 tablet, Rfl: 3   triamcinolone ointment (KENALOG) 0.1 %, APPLY TWICE DAILY TO BITES AND RASH UNTIL FLAT AND SMOOTH **DO NOT APPLY TO FACE**, Disp: 80 g, Rfl: 0  EXAM:  GENERAL: alert, oriented, appears well and in no acute distress  HEENT: atraumatic, conjunttiva clear, no obvious abnormalities on inspection of external nose and ears  NECK: normal movements of the head and neck  LUNGS:  on inspection no signs of respiratory distress, breathing rate appears normal, no obvious gross SOB, gasping or wheezing  CV: no obvious cyanosis  PSYCH/NEURO: pleasant and cooperative, no obvious depression or anxiety, speech and thought processing grossly intact  ASSESSMENT AND PLAN:  Discussed the following assessment and plan:  Problem List Items Addressed This Visit     Atrial fibrillation (HCC)   Recent admission - afib with RVR. Reported fatigue. Reports pulse rate 80-103. Continues on amiodarone. Just saw cardiology. Weighing daily. Will plan to check metabolic panel with upcoming labs.  Overall feels stable.       Relevant Medications   apixaban (ELIQUIS) 5 MG TABS tablet   metoprolol succinate (TOPROL-XL) 25 MG 24 hr tablet   Dysuria - Primary   Reports increased dysuria and discomfort. Declines vaginal discomfort. No vaginal discharge. Discussed urine results - negative with low specific gravity. Given symptoms and some confusion, discussed treatment. Discussed allergies. Will treat with monurol. Await culture results. Follow.  Call with update.        Return if symptoms worsen or fail to improve.   I discussed the assessment and treatment plan with the patient. The patient was provided an opportunity to ask questions and all were answered. The patient agreed with the plan and demonstrated an understanding of the instructions.   The patient was advised to call back or seek an in-person evaluation if the symptoms worsen or if the condition fails to improve as anticipated.    Dale [place], MD

## 2023-11-01 ENCOUNTER — Encounter: Payer: Self-pay | Admitting: Internal Medicine

## 2023-11-01 NOTE — Assessment & Plan Note (Signed)
 Reports increased dysuria and discomfort. Declines vaginal discomfort. No vaginal discharge. Discussed urine results - negative with low specific gravity. Given symptoms and some confusion, discussed treatment. Discussed allergies. Will treat with monurol. Await culture results. Follow.  Call with update.

## 2023-11-01 NOTE — Assessment & Plan Note (Signed)
 Recent admission - afib with RVR. Reported fatigue. Reports pulse rate 80-103. Continues on amiodarone. Just saw cardiology. Weighing daily. Will plan to check metabolic panel with upcoming labs.  Overall feels stable.

## 2023-11-03 ENCOUNTER — Encounter: Payer: Self-pay | Admitting: Internal Medicine

## 2023-11-03 ENCOUNTER — Ambulatory Visit
Admission: RE | Admit: 2023-11-03 | Discharge: 2023-11-03 | Disposition: A | Discharge status: Home / Self Care | Attending: Internal Medicine | Admitting: Internal Medicine

## 2023-11-03 ENCOUNTER — Ambulatory Visit
Admission: RE | Admit: 2023-11-03 | Discharge: 2023-11-03 | Disposition: A | Discharge status: Home / Self Care | Source: Ambulatory Visit | Attending: Internal Medicine | Admitting: Internal Medicine

## 2023-11-03 ENCOUNTER — Ambulatory Visit (INDEPENDENT_AMBULATORY_CARE_PROVIDER_SITE_OTHER): Payer: Medicare Other | Admitting: Internal Medicine

## 2023-11-03 VITALS — BP 110/70 | HR 88 | Temp 98.0°F | Resp 16 | Ht 62.0 in | Wt 179.0 lb

## 2023-11-03 DIAGNOSIS — I10 Essential (primary) hypertension: Secondary | ICD-10-CM | POA: Diagnosis not present

## 2023-11-03 DIAGNOSIS — R059 Cough, unspecified: Secondary | ICD-10-CM | POA: Diagnosis not present

## 2023-11-03 DIAGNOSIS — J9611 Chronic respiratory failure with hypoxia: Secondary | ICD-10-CM

## 2023-11-03 DIAGNOSIS — I700 Atherosclerosis of aorta: Secondary | ICD-10-CM

## 2023-11-03 DIAGNOSIS — D649 Anemia, unspecified: Secondary | ICD-10-CM

## 2023-11-03 DIAGNOSIS — R5383 Other fatigue: Secondary | ICD-10-CM

## 2023-11-03 DIAGNOSIS — I4891 Unspecified atrial fibrillation: Secondary | ICD-10-CM

## 2023-11-03 DIAGNOSIS — E785 Hyperlipidemia, unspecified: Secondary | ICD-10-CM

## 2023-11-03 DIAGNOSIS — I517 Cardiomegaly: Secondary | ICD-10-CM | POA: Diagnosis not present

## 2023-11-03 DIAGNOSIS — E039 Hypothyroidism, unspecified: Secondary | ICD-10-CM

## 2023-11-03 DIAGNOSIS — M542 Cervicalgia: Secondary | ICD-10-CM

## 2023-11-03 DIAGNOSIS — J4520 Mild intermittent asthma, uncomplicated: Secondary | ICD-10-CM

## 2023-11-03 DIAGNOSIS — R0989 Other specified symptoms and signs involving the circulatory and respiratory systems: Secondary | ICD-10-CM | POA: Diagnosis not present

## 2023-11-03 DIAGNOSIS — I509 Heart failure, unspecified: Secondary | ICD-10-CM | POA: Diagnosis not present

## 2023-11-03 DIAGNOSIS — M7989 Other specified soft tissue disorders: Secondary | ICD-10-CM

## 2023-11-03 DIAGNOSIS — R918 Other nonspecific abnormal finding of lung field: Secondary | ICD-10-CM

## 2023-11-03 DIAGNOSIS — R1310 Dysphagia, unspecified: Secondary | ICD-10-CM

## 2023-11-03 DIAGNOSIS — I4819 Other persistent atrial fibrillation: Secondary | ICD-10-CM | POA: Diagnosis not present

## 2023-11-03 DIAGNOSIS — I272 Pulmonary hypertension, unspecified: Secondary | ICD-10-CM

## 2023-11-03 DIAGNOSIS — K219 Gastro-esophageal reflux disease without esophagitis: Secondary | ICD-10-CM

## 2023-11-03 LAB — HEPATIC FUNCTION PANEL
ALT: 10 U/L (ref 0–35)
AST: 12 U/L (ref 0–37)
Albumin: 3.4 g/dL — ABNORMAL LOW (ref 3.5–5.2)
Alkaline Phosphatase: 85 U/L (ref 39–117)
Bilirubin, Direct: 0.1 mg/dL (ref 0.0–0.3)
Total Bilirubin: 0.3 mg/dL (ref 0.2–1.2)
Total Protein: 5.7 g/dL — ABNORMAL LOW (ref 6.0–8.3)

## 2023-11-03 LAB — CBC WITH DIFFERENTIAL/PLATELET
Basophils Absolute: 0.1 10*3/uL (ref 0.0–0.1)
Basophils Relative: 1 % (ref 0.0–3.0)
Eosinophils Absolute: 0.3 10*3/uL (ref 0.0–0.7)
Eosinophils Relative: 5.2 % — ABNORMAL HIGH (ref 0.0–5.0)
HCT: 31.2 % — ABNORMAL LOW (ref 36.0–46.0)
Hemoglobin: 10.3 g/dL — ABNORMAL LOW (ref 12.0–15.0)
Lymphocytes Relative: 19 % (ref 12.0–46.0)
Lymphs Abs: 1 10*3/uL (ref 0.7–4.0)
MCHC: 33.2 g/dL (ref 30.0–36.0)
MCV: 102.8 fl — ABNORMAL HIGH (ref 78.0–100.0)
Monocytes Absolute: 0.5 10*3/uL (ref 0.1–1.0)
Monocytes Relative: 10 % (ref 3.0–12.0)
Neutro Abs: 3.4 10*3/uL (ref 1.4–7.7)
Neutrophils Relative %: 64.8 % (ref 43.0–77.0)
Platelets: 268 10*3/uL (ref 150.0–400.0)
RBC: 3.03 Mil/uL — ABNORMAL LOW (ref 3.87–5.11)
RDW: 14 % (ref 11.5–15.5)
WBC: 5.2 10*3/uL (ref 4.0–10.5)

## 2023-11-03 LAB — BASIC METABOLIC PANEL
BUN: 18 mg/dL (ref 6–23)
CO2: 32 meq/L (ref 19–32)
Calcium: 9.4 mg/dL (ref 8.4–10.5)
Chloride: 101 meq/L (ref 96–112)
Creatinine, Ser: 1.04 mg/dL (ref 0.40–1.20)
GFR: 47.27 mL/min — ABNORMAL LOW (ref >60.00)
Glucose, Bld: 81 mg/dL (ref 70–99)
Potassium: 4.4 meq/L (ref 3.5–5.1)
Sodium: 142 meq/L (ref 135–145)

## 2023-11-03 NOTE — Assessment & Plan Note (Signed)
 With recent cough and congestion as outlined. Breathing - stable.  Increased air movement. Check cxr. Treat acid reflux. Hold abx. F/u with pulmonary for further evaluation and testing.

## 2023-11-03 NOTE — Assessment & Plan Note (Signed)
 Continue lipitor  ?

## 2023-11-03 NOTE — Assessment & Plan Note (Signed)
 Continue nocturnal oxygen

## 2023-11-03 NOTE — Assessment & Plan Note (Signed)
 Has had neck and shoulder pain. Has seen ortho. Outpatient PT. Continue exercise.

## 2023-11-03 NOTE — Assessment & Plan Note (Signed)
 Had barium swallow/UGI - limited examination performed due to patient condition. Esophageal dysmotility. Small volume gastroesophageal reflux. Worsening reflux. Continue protonix bid. Add pepcid before bed. Given worsening symptoms, refer back to GI for further evaluation.

## 2023-11-03 NOTE — Assessment & Plan Note (Signed)
 Continue lipitor.  Low cholesterol diet and exercise.  Follow lipid panel and liver function tests.

## 2023-11-03 NOTE — Assessment & Plan Note (Signed)
 Continue amiodarone, eliquis and metoprolol. Heart rate better controlled. Continue f/u with cardiology. Has f/u this week.

## 2023-11-03 NOTE — Assessment & Plan Note (Signed)
Continue thyroid replacement.  Follow tsh.   

## 2023-11-03 NOTE — Assessment & Plan Note (Signed)
 ECHO 60% with Grade 1DD

## 2023-11-03 NOTE — Assessment & Plan Note (Signed)
 Had been evaluated by pulmonary. Using oxygen at night. Discussed possible sleep apnea and further testing. Referral back to pulmonary for further evaluation.

## 2023-11-03 NOTE — Assessment & Plan Note (Signed)
 Followed by Dr Donneta Romberg. - f/u anemia. Check cbc today.

## 2023-11-03 NOTE — Assessment & Plan Note (Signed)
 Lung nodules noted on recent CT. Discussed need for f/u with pulmonary for further f/u and evaluation. She request referral back to [facility] pulmonary for further evaluation.

## 2023-11-03 NOTE — Assessment & Plan Note (Signed)
Compression hose.   

## 2023-11-03 NOTE — Assessment & Plan Note (Signed)
Continue symbicort 

## 2023-11-03 NOTE — Progress Notes (Addendum)
 Subjective:    Patient ID: Kimberly Roy, female    DOB: August 30, 1931, 89 y.o.   MRN: [id number]  Patient here for  Chief Complaint  Patient presents with   Medical Management of Chronic Issues    HPI Here for a scheduled follow up. She is accompanied by her daughter. History obtained from both of them. Was evaluated via virtual visit 10/31/23 - with concerns regarding dysuria. Given symptoms, she was prescribed fosfamycin. Urine culture negative. She did not take the fosfamycin. She was seen in ER 10/22/23 with generalized weakness. Labs revealed stable anemia. BNP slightly elevated, but no significant change from previous. Troponin negative. CXR without acute findings. F/u with cardiology 10/20/23 - eliquis 5mg  bid. Metoprolol increased to 25mg  bid. Recommended 72 hour holter. During evaluation 07/2023 - CTA negative for large vessel occlusion. Moderate stenosis involving the distal right V2 segment at the level of the right C3 transverse foramen due to extrinsic compression. Also noted - few bilaterl pulmonary nodules - largest 6mm at the RUL. Recommended f/u chest CT 3-6 months. Discussed with her today. Discussed f/u with pulmonary. Agreeable. Daughter reports concern regarding - falling asleep easily. Also describes some jerking movements when sleeping. Uses oxygen at night. Discussed possible sleep apnea. Has noticed increased acid reflux. Worsened recently. Has noticed over the last several days worsening dysphagia. Regurgitation of pills. Also has noticed - over the last two weeks - increased cough - productive of yellow mucus. No abdominal pain. Daughter concerned regarding her weight. Weighing daily. Reviewed outside weights. Has been receiving extra 20mg  lasix q day recently.    Past Medical History:  Diagnosis Date   Anemia    Anxiety    Chest pain    CHF (congestive heart failure) (HCC)    Constipation    DDD (degenerative disc disease), cervical    Depression    DVT (deep venous  thrombosis) (HCC)    Dysphonia    Dyspnea    Fatty liver    Fatty liver    Headache    Hyperlipidemia    Hyperpiesia    Hypertension    Hypothyroidism    Interstitial cystitis    Left ventricular dysfunction    Lymphedema    Nephrolithiasis    Obstructive sleep apnea    Osteoarthritis    knees/cervical and lumbar spine   Pulmonary hypertension (HCC)    Pulmonary nodules    followed by Dr Meredeth Ide   Pure hypercholesterolemia    Renal cyst    right   Past Surgical History:  Procedure Laterality Date   ABDOMINAL HYSTERECTOMY     ovaries left in place   APPENDECTOMY     Back Surgeries     BACK SURGERY     BREAST REDUCTION SURGERY     3/99   CARDIAC CATHETERIZATION     cataracts Bilateral    CERVICAL SPINE SURGERY     ESOPHAGEAL MANOMETRY N/A 08/02/2015   Procedure: ESOPHAGEAL MANOMETRY (EM);  Surgeon: Elnita Maxwell, MD;  Location: [facility] ENDOSCOPY;  Service: Endoscopy;  Laterality: N/A;   ESOPHAGOGASTRODUODENOSCOPY N/A 02/27/2015   Procedure: ESOPHAGOGASTRODUODENOSCOPY (EGD);  Surgeon: Wallace Cullens, MD;  Location: [facility] ENDOSCOPY;  Service: Gastroenterology;  Laterality: N/A;   ESOPHAGOGASTRODUODENOSCOPY (EGD) WITH PROPOFOL N/A 10/30/2018   Procedure: ESOPHAGOGASTRODUODENOSCOPY (EGD) WITH PROPOFOL;  Surgeon: Christena Deem, MD;  Location: [facility] ENDOSCOPY;  Service: Endoscopy;  Laterality: N/A;   EXCISIONAL HEMORRHOIDECTOMY     EYE SURGERY     FRACTURE SURGERY  HEMORRHOID SURGERY     HIP SURGERY  2013   Right hip surgery   JOINT REPLACEMENT     KNEE ARTHROSCOPY     left and right   ORIF FEMUR FRACTURE Right 08/07/2018   Procedure: OPEN REDUCTION INTERNAL FIXATION (ORIF) DISTAL FEMUR FRACTURE;  Surgeon: Kennedy Bucker, MD;  Location: [facility] ORS;  Service: Orthopedics;  Laterality: Right;   REDUCTION MAMMAPLASTY Bilateral YRS AGO   REPLACEMENT TOTAL KNEE Bilateral    rotator cuff surgery     blilateral   TONSILECTOMY/ADENOIDECTOMY WITH MYRINGOTOMY     VISCERAL ARTERY  INTERVENTION N/A 02/25/2019   Procedure: VISCERAL ARTERY INTERVENTION;  Surgeon: Annice Needy, MD;  Location: [facility] INVASIVE CV LAB;  Service: Cardiovascular;  Laterality: N/A;   Family History  Problem Relation Age of Onset   Heart disease Mother    Stroke Mother    Hypertension Mother    Heart disease Father        myocardial infarction age 11   Breast cancer Neg Hx    Social History   Socioeconomic History   Marital status: Widowed    Spouse name: Not on file   Number of children: 2   Years of education: 32   Highest education level: Associate degree: occupational, Scientist, product/process development, or vocational program  Occupational History    Comment: Engineer, site  Tobacco Use   Smoking status: Never   Smokeless tobacco: Never  Vaping Use   Vaping status: Never Used  Substance and Sexual Activity   Alcohol use: No    Alcohol/week: 0.0 standard drinks of alcohol   Drug use: Never   Sexual activity: Not Currently  Other Topics Concern   Not on file  Social History Narrative   Not on file   Social Drivers of Health   Financial Resource Strain: Low Risk  (09/16/2023)   Overall Financial Resource Strain (CARDIA)    Difficulty of Paying Living Expenses: Not very hard  Food Insecurity: No Food Insecurity (10/13/2023)   Hunger Vital Sign    Worried About Running Out of Food in the Last Year: Never true    Ran Out of Food in the Last Year: Never true  Transportation Needs: No Transportation Needs (10/13/2023)   PRAPARE - Administrator, Civil Service (Medical): No    Lack of Transportation (Non-Medical): No  Physical Activity: Inactive (09/16/2023)   Exercise Vital Sign    Days of Exercise per Week: 0 days    Minutes of Exercise per Session: 10 min  Stress: No Stress Concern Present (09/16/2023)   [employer] of Occupational Health - Occupational Stress Questionnaire    Feeling of Stress : Not at all  Social Connections: Socially Isolated (10/13/2023)   Social Connection  and Isolation Panel [NHANES]    Frequency of Communication with Friends and Family: More than three times a week    Frequency of Social Gatherings with Friends and Family: More than three times a week    Attends Religious Services: Never    Database administrator or Organizations: No    Attends Banker Meetings: Never    Marital Status: Widowed     Review of Systems  Constitutional:  Positive for fatigue. Negative for fever.  HENT:  Negative for congestion and sinus pressure.   Respiratory:  Positive for cough. Negative for chest tightness.        She denies increased sob.   Cardiovascular:  Negative for chest pain and palpitations.  No increased swelling.   Gastrointestinal:  Negative for vomiting.       Increased acid reflux. Dysphagia. Pill regurgitation.   Genitourinary:  Negative for difficulty urinating and dysuria.  Musculoskeletal:  Negative for joint swelling and myalgias.  Skin:  Negative for color change and rash.  Neurological:  Negative for dizziness and headaches.  Psychiatric/Behavioral:  Negative for agitation and dysphoric mood.        Objective:     BP 110/70   Pulse 88   Temp 98 F (36.7 C)   Resp 16   Ht 5\' 2"  (1.575 m)   Wt 179 lb (81.2 kg)   SpO2 95%   BMI 32.74 kg/m  Wt Readings from Last 3 Encounters:  11/03/23 179 lb (81.2 kg)  10/22/23 173 lb (78.5 kg)  10/16/23 179 lb 3.2 oz (81.3 kg)    Physical Exam Vitals reviewed.  Constitutional:      General: She is not in acute distress.    Appearance: Normal appearance.  HENT:     Head: Normocephalic and atraumatic.     Right Ear: Ear canal and external ear normal.     Left Ear: Tympanic membrane, ear canal and external ear normal.  Eyes:     General: No scleral icterus.       Right eye: No discharge.        Left eye: No discharge.     Conjunctiva/sclera: Conjunctivae normal.  Neck:     Thyroid: No thyromegaly.  Cardiovascular:     Rate and Rhythm: Normal rate and  regular rhythm.  Pulmonary:     Effort: No respiratory distress.     Breath sounds: No wheezing.     Comments: Increased air movement. No wheezing. No increased congestion.  Abdominal:     General: Bowel sounds are normal.     Palpations: Abdomen is soft.     Tenderness: There is no abdominal tenderness.  Musculoskeletal:        General: No tenderness.     Cervical back: Neck supple. No tenderness.     Comments: Compression hose in place. No increased swelling.   Lymphadenopathy:     Cervical: No cervical adenopathy.  Skin:    Findings: No erythema or rash.  Neurological:     Mental Status: She is alert.  Psychiatric:        Mood and Affect: Mood normal.        Behavior: Behavior normal.         Outpatient Encounter Medications as of 11/03/2023  Medication Sig   albuterol (VENTOLIN HFA) 108 (90 Base) MCG/ACT inhaler INHALE 2 PUFFS INTO THE LUNGS EVERY 6 HOURS AS NEEDED FOR WHEEZING OR SHORTNESS OF BREATH   aluminum-magnesium hydroxide-simethicone (MAALOX) 200-200-20 MG/5ML SUSP Take 15 mLs by mouth 4 (four) times daily -  before meals and at bedtime.    amiodarone (PACERONE) 200 MG tablet Take 1 tablet (200 mg total) by mouth daily.   amLODipine (NORVASC) 10 MG tablet Take 1 tablet (10 mg total) by mouth daily.   apixaban (ELIQUIS) 5 MG TABS tablet Take 5 mg by mouth 2 (two) times daily.   atorvastatin (LIPITOR) 20 MG tablet TAKE 1 TABLET BY MOUTH AT BEDTIME   benazepril (LOTENSIN) 40 MG tablet Take 1 tablet (40 mg total) by mouth daily.   betamethasone dipropionate 0.05 % cream Apply 1 application topically 2 (two) times daily.   budesonide-formoterol (SYMBICORT) 80-4.5 MCG/ACT inhaler Inhale 2 puffs into the lungs in the morning  and at bedtime.   busPIRone (BUSPAR) 10 MG tablet Take 10 mg by mouth 3 (three) times daily.   calcium citrate-vitamin D (CITRACAL+D) 315-200 MG-UNIT tablet Take 2 tablets by mouth daily.   Cholecalciferol (VITAMIN D3) 1000 UNITS CAPS Take 1 capsule  by mouth daily.   clobetasol cream (TEMOVATE) 0.05 % Apply 1 application topically See admin instructions. Apply to affected areas of body 1 - 2 times daily as needed for itchy bumps. Avoid applying to face, groin, and axilla. Use as directed.   cyanocobalamin (VITAMIN B12) 1000 MCG/ML injection INJECT 1 ML INTO THE MUSCLE EVERY 3 MONTHS.   Diclofenac Sodium 3 % GEL Apply topically 2 (two) times daily.   Docusate Sodium (DSS) 100 MG CAPS Take 100 mg by mouth 2 (two) times daily.   DULoxetine (CYMBALTA) 20 MG capsule Take 20 mg by mouth daily.   DULoxetine (CYMBALTA) 60 MG capsule Take 60 mg by mouth daily.   Fluocinolone Acetonide 0.01 % OIL Apply 1-2 drops into ears once to twice daily as needed.   fluticasone (FLONASE) 50 MCG/ACT nasal spray Place 2 sprays into both nostrils daily.   furosemide (LASIX) 20 MG tablet TAKE 1 TABLET BY MOUTH DAILY AS NEEDED (Patient taking differently: Take 20 mg by mouth daily.)   gabapentin (NEURONTIN) 300 MG capsule TAKE 2 CAPSULES BY MOUTH 3 TIMES A DAY (IN THE MORNING, MIDDAY AND AT BEDTIME).   HYDROcodone-acetaminophen (NORCO) 10-325 MG tablet Take 1 tablet by mouth 3 (three) times daily.   Infant Care Products [facility]) OINT Apply topically as directed (as discussed).   ipratropium (ATROVENT) 0.03 % nasal spray Place 2 sprays into both nostrils every 12 (twelve) hours.   levalbuterol (XOPENEX) 0.63 MG/3ML nebulizer solution Take 3 mLs (0.63 mg total) by nebulization every 6 (six) hours as needed for wheezing or shortness of breath.   LINZESS 290 MCG CAPS capsule Take 290 mcg by mouth daily.   magnesium oxide (MAG-OX) 400 MG tablet Take 1 tablet (400 mg total) by mouth daily.   metoprolol succinate (TOPROL-XL) 25 MG 24 hr tablet Take 25 mg by mouth daily.   morphine (MS CONTIN) 30 MG 12 hr tablet Take 30 mg by mouth daily. 1400   morphine (MS CONTIN) 60 MG 12 hr tablet Take 1 tablet (60 mg total) by mouth 2 (two) times daily.   mupirocin ointment  (BACTROBAN) 2 % Apply 1 Application topically daily. Qd to excision site   nystatin (MYCOSTATIN/NYSTOP) powder Apply 1 application. topically 2 (two) times daily as needed.   nystatin cream (MYCOSTATIN) Apply 1 Application topically 2 (two) times daily.   pantoprazole (PROTONIX) 40 MG tablet Take 1 tablet (40 mg total) by mouth 2 (two) times daily.   polyethylene glycol powder (GLYCOLAX/MIRALAX) 17 GM/SCOOP powder MIX 17 GRAMS AS MARKED ON BOTTLE TOP IN 8 OUNCES OF WATER AND DRINK ONCE A DAY AS DIRECTED.   Simethicone (GAS-X PO) Take 2 tablets by mouth 2 (two) times daily.   sodium chloride (OCEAN) 0.65 % nasal spray Place 1 spray into the nose as needed.   SYNTHROID 100 MCG tablet TAKE 1 TABLET BY MOUTH DAILY   triamcinolone ointment (KENALOG) 0.1 % APPLY TWICE DAILY TO BITES AND RASH UNTIL FLAT AND SMOOTH **DO NOT APPLY TO FACE**   [DISCONTINUED] fluocinonide (LIDEX) 0.05 % external solution Apply topically 2 (two) times daily. Apply drops to scalp for itch   [DISCONTINUED] ketoconazole (NIZORAL) 2 % shampoo Apply 1 Application topically See admin instructions. apply three times  per week, massage into scalp and leave in for 10 minutes before rinsing out   [DISCONTINUED] predniSONE (DELTASONE) 10 MG tablet Taper dose 60mg x2 days/50mg x2 days/40mg x2 days/30mg x2 days/20mg x2 days/10mg x2 days, stop Pt started 10/07/23   No facility-administered encounter medications on file as of 11/03/2023.     Lab Results  Component Value Date   WBC 5.2 11/03/2023   HGB 10.3 (L) 11/03/2023   HCT 31.2 (L) 11/03/2023   PLT 268.0 11/03/2023   GLUCOSE 81 11/03/2023   CHOL 125 05/19/2023   TRIG 67.0 05/19/2023   HDL 54.00 05/19/2023   LDLCALC 58 05/19/2023   ALT 10 11/03/2023   AST 12 11/03/2023   NA 142 11/03/2023   K 4.4 11/03/2023   CL 101 11/03/2023   CREATININE 1.04 11/03/2023   BUN 18 11/03/2023   CO2 32 11/03/2023   TSH 0.468 10/09/2023   INR 0.89 08/06/2018   HGBA1C 6.0 10/19/2014    DG Chest  2 View Result Date: 10/22/2023 CLINICAL DATA:  Weakness.  Recent pneumonia. EXAM: CHEST - 2 VIEW COMPARISON:  10/09/2023. FINDINGS: Low lung volume. There are increased interstitial markings throughout bilateral lungs, which are nonspecific but appears grossly similar to the prior study. No frank pulmonary edema. There are probable atelectatic changes at the left lung base. Elevated right hemidiaphragm noted. Bilateral costophrenic angles are clear. Stable cardio-mediastinal silhouette. Presumed cardiac monitor noted overlying the left lower anterior chest wall. No acute osseous abnormalities. Neurostimulator device lead again seen in the spinal canal at midthoracic level. The soft tissues are within normal limits. IMPRESSION: No active cardiopulmonary disease. Electronically Signed   By: Jules Schick M.D.   On: 10/22/2023 14:14       Assessment & Plan:  Cough, unspecified type Assessment & Plan: With recent cough and congestion as outlined. Breathing - stable.  Increased air movement. Check cxr. Treat acid reflux. Hold abx. F/u with pulmonary for further evaluation and testing.   Orders: -     DG Chest 2 View; Future -     CBC with Differential/Platelet -     Ambulatory referral to Pulmonology  Congestive heart failure, unspecified HF chronicity, unspecified heart failure type (HCC) Assessment & Plan: ECHO 60% with Grade 1DD   Orders: -     CBC with Differential/Platelet -     Basic metabolic panel -     Hepatic function panel  Primary hypertension Assessment & Plan: Continue amlodipine and benazepril. Also taking metoprolol. She is taking 25mg  q am. Holding the pm dose. Blood pressure doing well. Follow pressures.    Pulmonary nodules Assessment & Plan: Lung nodules noted on recent CT. Discussed need for f/u with pulmonary for further f/u and evaluation. She request referral back to [facility] pulmonary for further evaluation.   Orders: -     Ambulatory referral to  Pulmonology  Pulmonary hypertension [facility]) Assessment & Plan: Had been evaluated by pulmonary. Using oxygen at night. Discussed possible sleep apnea and further testing. Referral back to pulmonary for further evaluation.   Orders: -     Ambulatory referral to Pulmonology  Neck pain Assessment & Plan: Has had neck and shoulder pain. Has seen ortho. Outpatient PT. Continue exercise.    Leg swelling Assessment & Plan: Compression hose.    Hypothyroidism, unspecified type Assessment & Plan: Continue thyroid replacement. Follow tsh.    Hyperlipidemia, unspecified hyperlipidemia type Assessment & Plan: Continue lipitor. Low cholesterol diet and exercise. Follow lipid panel and liver function tests.    Gastroesophageal reflux disease,  unspecified whether esophagitis present Assessment & Plan: Worsening acid reflux as outlined.  Also with dysphagia. On protonix bid. Add pepcid q hs. Arrange f/u with GI for further evaluation.    Other fatigue Assessment & Plan: Increased fatigue. Falling asleep easily as outlined. Uses oxygen at night. Discussed further evaluation - sleep apnea. Plan f/u with pulmonary.   Orders: -     Ambulatory referral to Pulmonology  Dysphagia, unspecified type Assessment & Plan: Had barium swallow/UGI - limited examination performed due to patient condition. Esophageal dysmotility. Small volume gastroesophageal reflux. Worsening reflux. Continue protonix bid. Add pepcid before bed. Given worsening symptoms, refer back to GI for further evaluation.    Chronic respiratory failure with hypoxia (HCC) Assessment & Plan: Continue nocturnal oxygen.    Atrial fibrillation, unspecified type [facility]) Assessment & Plan: Continue amiodarone, eliquis and metoprolol. Heart rate better controlled. Continue f/u with cardiology. Has f/u this week.    Mild intermittent asthma without complication Assessment & Plan: Continue symbicort.    Aortic atherosclerosis  (HCC) Assessment & Plan: Continue lipitor.    Anemia, unspecified type Assessment & Plan: Followed by Dr Donneta Romberg. - f/u anemia. Check cbc today.     I spent 45 minutes with the patient. Time spent discussing her current concerns and symptoms. Specifically time spent discussing current concerns regarding increased fatigue and increased somnolence and increased acid reflux and dysphagia.  Time also spent discussing further w/up, evaluation and treatment.    Dale Valrico, MD

## 2023-11-03 NOTE — Assessment & Plan Note (Signed)
 Continue amlodipine and benazepril. Also taking metoprolol. She is taking 25mg  q am. Holding the pm dose. Blood pressure doing well. Follow pressures.

## 2023-11-03 NOTE — Addendum Note (Signed)
 Addended by: Charm Barges on: 11/03/2023 10:23 PM   Modules accepted: Orders

## 2023-11-03 NOTE — Assessment & Plan Note (Signed)
 Increased fatigue. Falling asleep easily as outlined. Uses oxygen at night. Discussed further evaluation - sleep apnea. Plan f/u with pulmonary.

## 2023-11-03 NOTE — Assessment & Plan Note (Signed)
 Worsening acid reflux as outlined.  Also with dysphagia. On protonix bid. Add pepcid q hs. Arrange f/u with GI for further evaluation.

## 2023-11-04 IMAGING — DX DG FOOT 2V*R*
2 series · 2 of 2 positions shown · non-contrast
Comparison: None

CLINICAL DATA: Foot pain and bruising status post injury.

EXAM:
RIGHT FOOT - 2 VIEW

[foot ap]
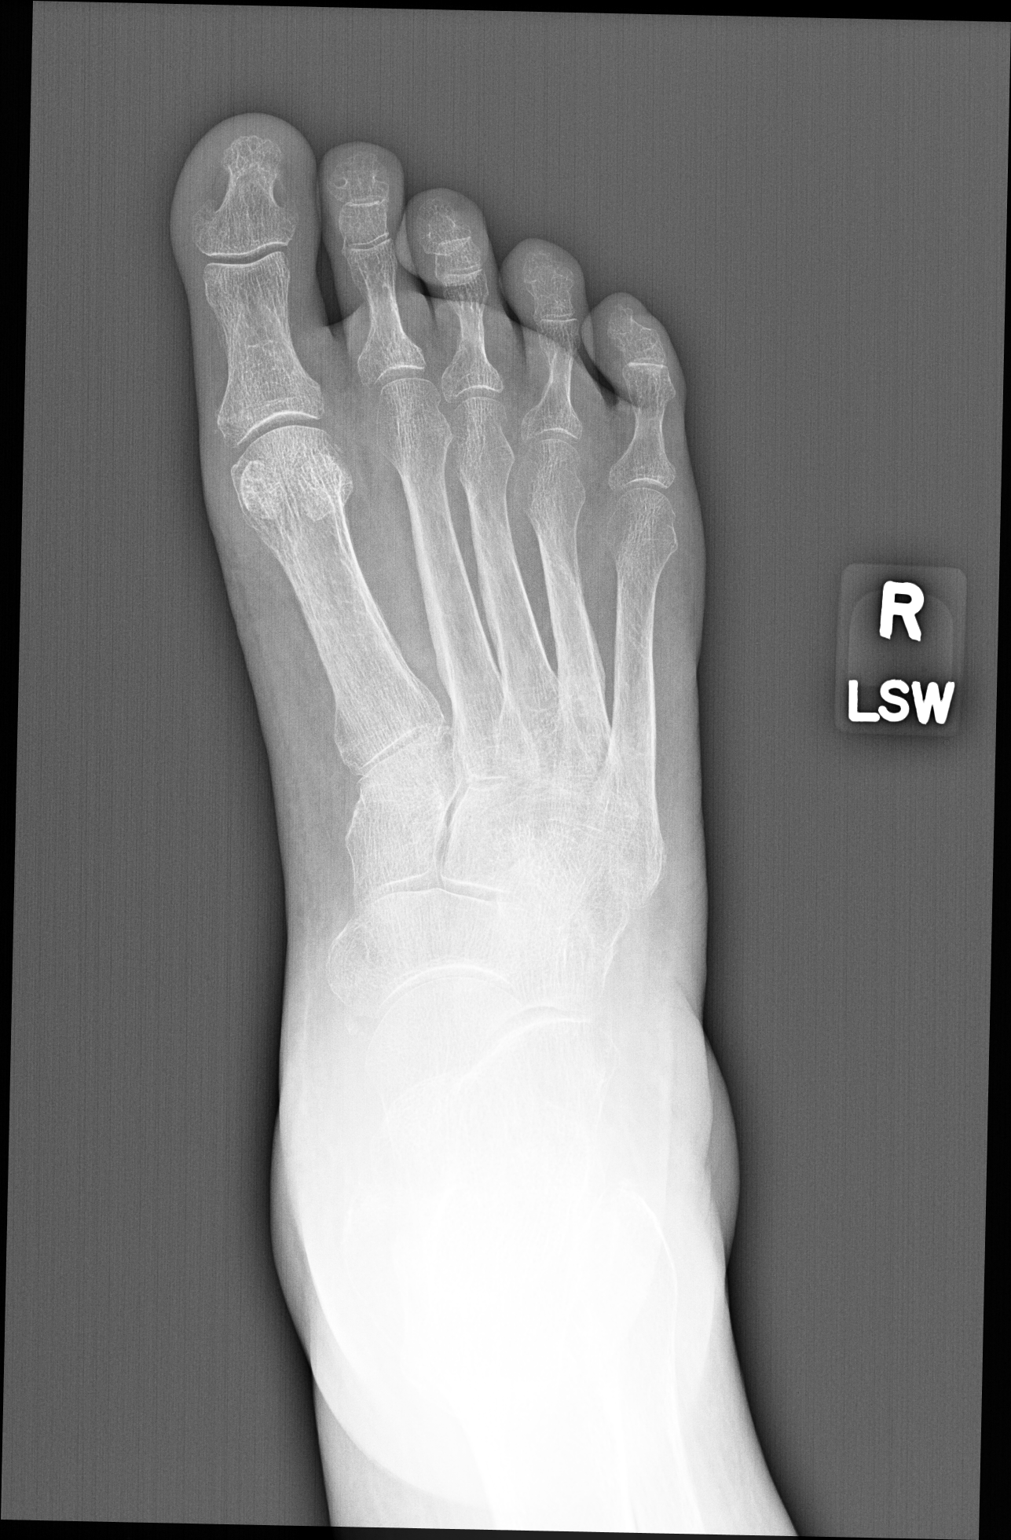

[foot lat]
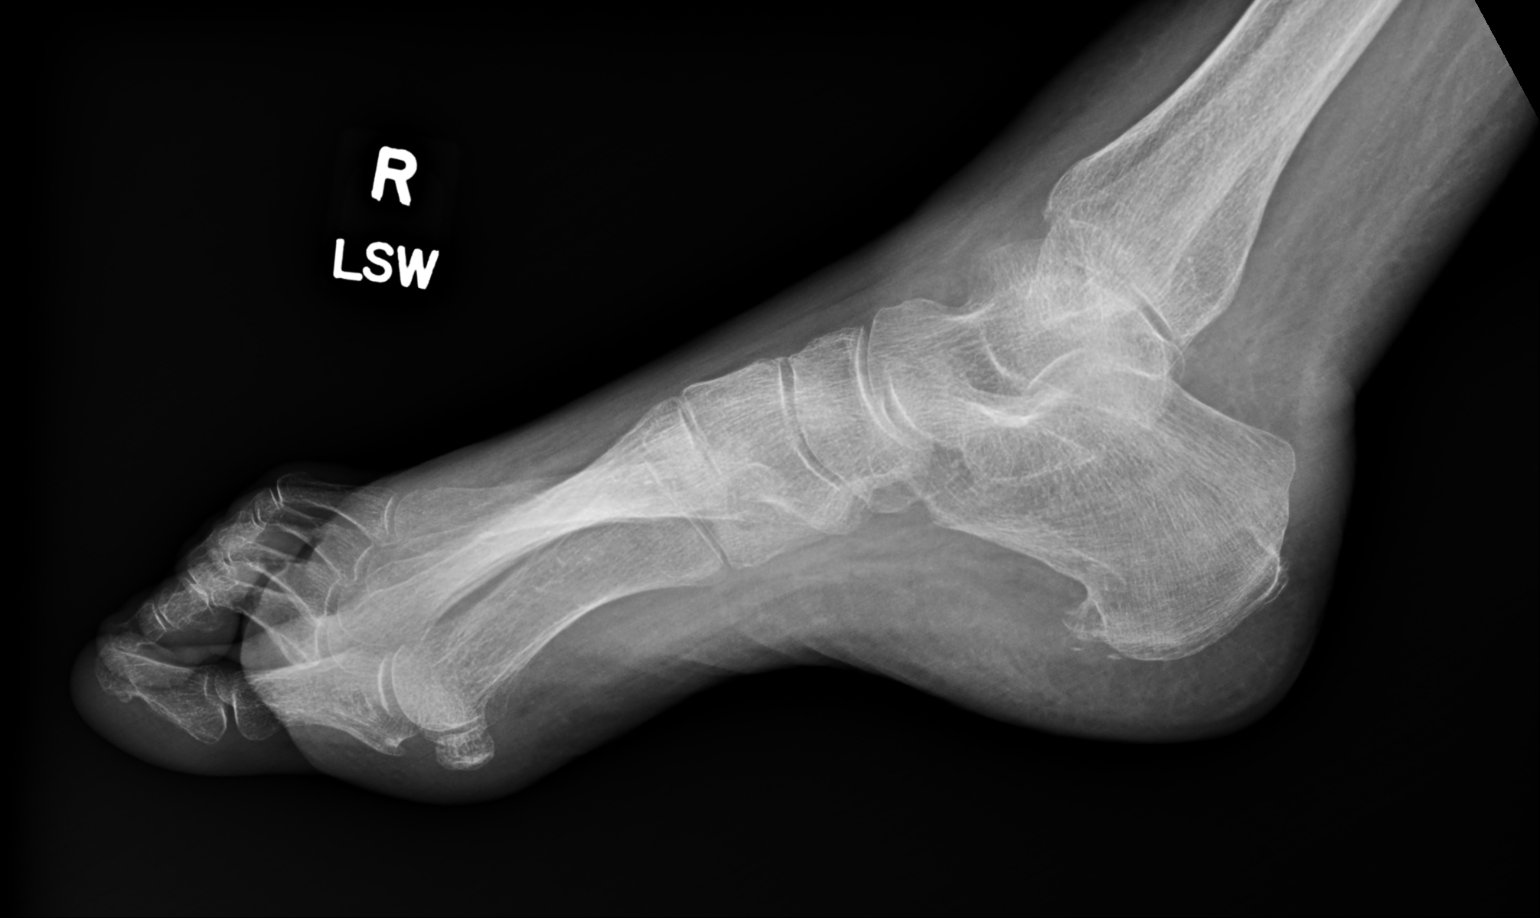

[2 of 2 positions shown; findings below may reference images not displayed]

FINDINGS: Diffuse decreased bone mineralization. Moderate plantar calcaneal
heel spur with mild adjacent punctate mineralization. No acute
fracture is seen. No dislocation.
IMPRESSION: Moderate plantar calcaneal heel spur.  No acute fracture is seen.

## 2023-11-06 ENCOUNTER — Encounter: Payer: Self-pay | Admitting: Student in an Organized Health Care Education/Training Program

## 2023-11-06 DIAGNOSIS — J4530 Mild persistent asthma, uncomplicated: Secondary | ICD-10-CM

## 2023-11-06 MED ORDER — BUDESONIDE-FORMOTEROL FUMARATE 80-4.5 MCG/ACT IN AERO: 2.0000 | INHALATION_SPRAY | Freq: Two times a day (BID) | RESPIRATORY_TRACT | 12 refills | Status: DC

## 2023-11-06 NOTE — Telephone Encounter (Signed)
 Rx sent to pharmacy

## 2023-11-10 ENCOUNTER — Telehealth: Payer: Self-pay

## 2023-11-10 DIAGNOSIS — I350 Nonrheumatic aortic (valve) stenosis: Secondary | ICD-10-CM | POA: Diagnosis not present

## 2023-11-10 DIAGNOSIS — I10 Essential (primary) hypertension: Secondary | ICD-10-CM | POA: Diagnosis not present

## 2023-11-10 DIAGNOSIS — I5032 Chronic diastolic (congestive) heart failure: Secondary | ICD-10-CM | POA: Diagnosis not present

## 2023-11-10 DIAGNOSIS — Z01810 Encounter for preprocedural cardiovascular examination: Secondary | ICD-10-CM | POA: Diagnosis not present

## 2023-11-10 NOTE — Telephone Encounter (Signed)
 Copied from CRM [id number]. Topic: Clinical - Medical Advice >> Nov 10, 2023 10:21 AM Alcus Dad wrote: Reason for CRM: Oxygen company sent certificate of medical necessity, needs to be signed and faxed back to the company [phone] Fax: [phone]

## 2023-11-12 NOTE — Telephone Encounter (Signed)
 Patient is trying to switch oxygen to [employer]. Discussed with Christa See rep. They will reach out to pt/

## 2023-11-13 ENCOUNTER — Emergency Department

## 2023-11-13 ENCOUNTER — Other Ambulatory Visit: Payer: Self-pay

## 2023-11-13 ENCOUNTER — Emergency Department
Admission: EM | Admit: 2023-11-13 | Discharge: 2023-11-14 | Disposition: A | Discharge status: Other Acute Inpatient Hospital | Attending: Emergency Medicine | Admitting: Emergency Medicine

## 2023-11-13 DIAGNOSIS — I10 Essential (primary) hypertension: Secondary | ICD-10-CM | POA: Insufficient documentation

## 2023-11-13 DIAGNOSIS — W19XXXA Unspecified fall, initial encounter: Secondary | ICD-10-CM | POA: Diagnosis not present

## 2023-11-13 DIAGNOSIS — M25551 Pain in right hip: Secondary | ICD-10-CM | POA: Insufficient documentation

## 2023-11-13 DIAGNOSIS — Z7901 Long term (current) use of anticoagulants: Secondary | ICD-10-CM | POA: Diagnosis not present

## 2023-11-13 DIAGNOSIS — K219 Gastro-esophageal reflux disease without esophagitis: Secondary | ICD-10-CM | POA: Diagnosis present

## 2023-11-13 DIAGNOSIS — M5136 Other intervertebral disc degeneration, lumbar region with discogenic back pain only: Secondary | ICD-10-CM | POA: Diagnosis not present

## 2023-11-13 DIAGNOSIS — S32040A Wedge compression fracture of fourth lumbar vertebra, initial encounter for closed fracture: Secondary | ICD-10-CM | POA: Diagnosis not present

## 2023-11-13 DIAGNOSIS — R519 Headache, unspecified: Secondary | ICD-10-CM | POA: Diagnosis not present

## 2023-11-13 DIAGNOSIS — E039 Hypothyroidism, unspecified: Secondary | ICD-10-CM | POA: Insufficient documentation

## 2023-11-13 DIAGNOSIS — I509 Heart failure, unspecified: Secondary | ICD-10-CM

## 2023-11-13 DIAGNOSIS — M85852 Other specified disorders of bone density and structure, left thigh: Secondary | ICD-10-CM | POA: Diagnosis not present

## 2023-11-13 DIAGNOSIS — Z96653 Presence of artificial knee joint, bilateral: Secondary | ICD-10-CM | POA: Diagnosis not present

## 2023-11-13 DIAGNOSIS — M545 Low back pain, unspecified: Secondary | ICD-10-CM | POA: Diagnosis not present

## 2023-11-13 DIAGNOSIS — S32002A Unstable burst fracture of unspecified lumbar vertebra, initial encounter for closed fracture: Secondary | ICD-10-CM | POA: Diagnosis not present

## 2023-11-13 DIAGNOSIS — S199XXA Unspecified injury of neck, initial encounter: Secondary | ICD-10-CM | POA: Diagnosis not present

## 2023-11-13 DIAGNOSIS — M1611 Unilateral primary osteoarthritis, right hip: Secondary | ICD-10-CM | POA: Diagnosis not present

## 2023-11-13 DIAGNOSIS — S32049A Unspecified fracture of fourth lumbar vertebra, initial encounter for closed fracture: Secondary | ICD-10-CM | POA: Diagnosis not present

## 2023-11-13 DIAGNOSIS — M5030 Other cervical disc degeneration, unspecified cervical region: Secondary | ICD-10-CM | POA: Diagnosis not present

## 2023-11-13 DIAGNOSIS — I4891 Unspecified atrial fibrillation: Secondary | ICD-10-CM | POA: Diagnosis not present

## 2023-11-13 DIAGNOSIS — R0902 Hypoxemia: Secondary | ICD-10-CM | POA: Diagnosis not present

## 2023-11-13 DIAGNOSIS — M549 Dorsalgia, unspecified: Secondary | ICD-10-CM | POA: Diagnosis present

## 2023-11-13 DIAGNOSIS — M47816 Spondylosis without myelopathy or radiculopathy, lumbar region: Secondary | ICD-10-CM | POA: Diagnosis not present

## 2023-11-13 DIAGNOSIS — S3992XA Unspecified injury of lower back, initial encounter: Secondary | ICD-10-CM | POA: Diagnosis present

## 2023-11-13 DIAGNOSIS — R918 Other nonspecific abnormal finding of lung field: Secondary | ICD-10-CM | POA: Diagnosis not present

## 2023-11-13 DIAGNOSIS — G8929 Other chronic pain: Secondary | ICD-10-CM | POA: Diagnosis present

## 2023-11-13 DIAGNOSIS — M50221 Other cervical disc displacement at C4-C5 level: Secondary | ICD-10-CM | POA: Diagnosis not present

## 2023-11-13 DIAGNOSIS — S32000A Wedge compression fracture of unspecified lumbar vertebra, initial encounter for closed fracture: Secondary | ICD-10-CM | POA: Diagnosis present

## 2023-11-13 DIAGNOSIS — M48061 Spinal stenosis, lumbar region without neurogenic claudication: Secondary | ICD-10-CM | POA: Diagnosis not present

## 2023-11-13 DIAGNOSIS — I672 Cerebral atherosclerosis: Secondary | ICD-10-CM | POA: Diagnosis not present

## 2023-11-13 LAB — URINALYSIS, ROUTINE W REFLEX MICROSCOPIC
Bilirubin Urine: NEGATIVE
Glucose, UA: NEGATIVE mg/dL
Hgb urine dipstick: NEGATIVE
Ketones, ur: NEGATIVE mg/dL
Leukocytes,Ua: NEGATIVE
Nitrite: NEGATIVE
Protein, ur: NEGATIVE mg/dL
Specific Gravity, Urine: 1.015 (ref 1.005–1.030)
pH: 5 (ref 5.0–8.0)

## 2023-11-13 LAB — COMPREHENSIVE METABOLIC PANEL
ALT: 18 U/L (ref 0–44)
AST: 25 U/L (ref 15–41)
Albumin: 3.2 g/dL — ABNORMAL LOW (ref 3.5–5.0)
Alkaline Phosphatase: 83 U/L (ref 38–126)
Anion gap: 12 (ref 5–15)
BUN: 23 mg/dL (ref 8–23)
CO2: 30 mmol/L (ref 22–32)
Calcium: 9.4 mg/dL (ref 8.9–10.3)
Chloride: 97 mmol/L — ABNORMAL LOW (ref 98–111)
Creatinine, Ser: 1.16 mg/dL — ABNORMAL HIGH (ref 0.44–1.00)
GFR, Estimated: 45 mL/min — ABNORMAL LOW (ref >60)
Glucose, Bld: 143 mg/dL — ABNORMAL HIGH (ref 70–99)
Potassium: 4.7 mmol/L (ref 3.5–5.1)
Sodium: 139 mmol/L (ref 135–145)
Total Bilirubin: 0.7 mg/dL (ref 0.0–1.2)
Total Protein: 6.4 g/dL — ABNORMAL LOW (ref 6.5–8.1)

## 2023-11-13 LAB — CBC WITH DIFFERENTIAL/PLATELET
Abs Immature Granulocytes: 0.11 10*3/uL — ABNORMAL HIGH (ref 0.00–0.07)
Basophils Absolute: 0.1 10*3/uL (ref 0.0–0.1)
Basophils Relative: 1 %
Eosinophils Absolute: 0 10*3/uL (ref 0.0–0.5)
Eosinophils Relative: 0 %
HCT: 28.9 % — ABNORMAL LOW (ref 36.0–46.0)
Hemoglobin: 9.3 g/dL — ABNORMAL LOW (ref 12.0–15.0)
Immature Granulocytes: 1 %
Lymphocytes Relative: 11 %
Lymphs Abs: 1.3 10*3/uL (ref 0.7–4.0)
MCH: 33.7 pg (ref 26.0–34.0)
MCHC: 32.2 g/dL (ref 30.0–36.0)
MCV: 104.7 fL — ABNORMAL HIGH (ref 80.0–100.0)
Monocytes Absolute: 0.7 10*3/uL (ref 0.1–1.0)
Monocytes Relative: 7 %
Neutro Abs: 8.9 10*3/uL — ABNORMAL HIGH (ref 1.7–7.7)
Neutrophils Relative %: 80 %
Platelets: 173 10*3/uL (ref 150–400)
RBC: 2.76 MIL/uL — ABNORMAL LOW (ref 3.87–5.11)
RDW: 14.2 % (ref 11.5–15.5)
WBC: 11.1 10*3/uL — ABNORMAL HIGH (ref 4.0–10.5)
nRBC: 0 % (ref 0.0–0.2)

## 2023-11-13 MED ORDER — MORPHINE SULFATE (PF) 4 MG/ML IV SOLN
4.0000 mg | Freq: Once | INTRAVENOUS | Status: AC
  Administered 2023-11-13: 4 mg via INTRAVENOUS
  Filled 2023-11-13: qty 1

## 2023-11-13 MED ORDER — FENTANYL CITRATE PF 50 MCG/ML IJ SOSY
50.0000 ug | PREFILLED_SYRINGE | Freq: Once | INTRAMUSCULAR | Status: AC
  Administered 2023-11-13: 50 ug via INTRAVENOUS
  Filled 2023-11-13: qty 1

## 2023-11-13 NOTE — ED Notes (Signed)
 Called [facility] @2214  transfer center to request patient come for Neurosurgery service Spoke to [id number] provided demographics/Faxed Face Sheet/Power shared images/ transferred call to provider for diagnosis. Waiting on call back

## 2023-11-13 NOTE — ED Triage Notes (Signed)
 First nurse note: Arrived by [facility] from home after fall going into house. Landed on back and heard a loud pop  History spinal rod fusion. Rod in right hip.  Reports ever since rod placed her right leg appears shorter. Patient reports she took her oxycodone at home for pain  EMS v/s WNL

## 2023-11-13 NOTE — ED Triage Notes (Signed)
 See first nurse note.

## 2023-11-13 NOTE — ED Provider Notes (Signed)
 [facility] Emergency Department Provider Note     Event Date/Time   First MD Initiated Contact with Patient 11/13/23 [phone]     (approximate)   History   Fall   HPI  Kimberly Roy is a 89 y.o. female with a history of osteoarthritis, HTN, HLD, hypothyroidism A-fib on apixaban, bilateral total knee arthroplasties, prior right hip ORIF and lumbar spinal fusion, presents to the ED following mechanical fall.  The report is that the patient was sitting on her rollator, and her caregiver was attempting to push her up a ramp into the home.  The wheel of the rollator became stuck, causing the caregiver to fall on top of the patient in the rollator onto her back on the ramp.  Patient reports immediate lumbar pain as she landed on the bar of the rollator.  She denies any frank head injury but is on anticoagulation therapy.  She has endorsed a mild headache since the incident.  No nausea, vomiting, dizziness reported. She landed on her back, and endorses an audible/palpable pop.  She presents via EMS for evaluation of injuries, which include pain to the low back and right hip.    Physical Exam   Triage Vital Signs: ED Triage Vitals  Encounter Vitals Group     BP 11/13/23 1423 135/69     Systolic BP Percentile --      Diastolic BP Percentile --      Pulse Rate 11/13/23 1423 100     Resp 11/13/23 1423 20     Temp 11/13/23 1423 98 F (36.7 C)     Temp Source 11/13/23 1423 Oral     SpO2 11/13/23 1423 96 %     Weight 11/13/23 1423 180 lb (81.6 kg)     Height 11/13/23 1423 5\' 2"  (1.575 m)     Head Circumference --      Peak Flow --      Pain Score 11/13/23 1422 10     Pain Loc --      Pain Education --      Exclude from Growth Chart --     Most recent vital signs: Vitals:   11/13/23 2112 11/13/23 2118  BP:    Pulse:    Resp:    Temp:  98.3 F (36.8 C)  SpO2: 93%     General Awake, no distress. NAD HEENT NCAT. PERRL. EOMI. No rhinorrhea. Mucous membranes  are moist.  CV:  Good peripheral perfusion. RRR RESP:  Normal effort. CTA ABD:  No distention. Soft, mildly tender to the general abd regions.. MSK:  Normal ROM BLE at the feet/ankles, limited by subjective back pain NEURO: CN II-XII grossly intact. Normal gross sensation   ED Results / Procedures / Treatments   Labs (all labs ordered are listed, but only abnormal results are displayed) Labs Reviewed  COMPREHENSIVE METABOLIC PANEL - Abnormal; Notable for the following components:      Result Value   Chloride 97 (*)    Glucose, Bld 143 (*)    Creatinine, Ser 1.16 (*)    Total Protein 6.4 (*)    Albumin 3.2 (*)    GFR, Estimated 45 (*)    All other components within normal limits  CBC WITH DIFFERENTIAL/PLATELET - Abnormal; Notable for the following components:   WBC 11.1 (*)    RBC 2.76 (*)    Hemoglobin 9.3 (*)    HCT 28.9 (*)    MCV 104.7 (*)  Neutro Abs 8.9 (*)    Abs Immature Granulocytes 0.11 (*)    All other components within normal limits  URINALYSIS, ROUTINE W REFLEX MICROSCOPIC - Abnormal; Notable for the following components:   Color, Urine YELLOW (*)    APPearance HAZY (*)    All other components within normal limits    EKG   RADIOLOGY  I personally viewed and evaluated these images as part of my medical decision making, as well as reviewing the written report by the radiologist.  ED Provider Interpretation: acute L3-4  CT Lumbar Spine Wo Contrast Addendum Date: 11/13/2023 ADDENDUM REPORT: 11/13/2023 21:22 ADDENDUM: These results were called by telephone at the time of interpretation on 11/13/2023 at 8:37 pm to provider Ray, MD, who verbally acknowledged these results. Electronically Signed   By: Helyn Numbers M.D.   On: 11/13/2023 21:22   Result Date: 11/13/2023 CLINICAL DATA:  Low back pain, fall, back injury EXAM: CT LUMBAR SPINE WITHOUT CONTRAST TECHNIQUE: Multidetector CT imaging of the lumbar spine was performed without intravenous contrast  administration. Multiplanar CT image reconstructions were also generated. RADIATION DOSE REDUCTION: This exam was performed according to the departmental dose-optimization program which includes automated exposure control, adjustment of the mA and/or kV according to patient size and/or use of iterative reconstruction technique. COMPARISON:  09/12/2017 FINDINGS: Segmentation: 5 lumbar type vertebrae. Alignment: There is acute hyperextension injury type fracture through the superior vertebral body of L4 and extending through the posterior elements bilaterally with distraction of the vertebral fracture fragments by approximately 18 mm and anterior translation of the superior component of the L4 vertebral body by approximately 8-9 mm. Vertebrae: The osseous structures are diffusely osteopenic. There is ankylosis of the facet joints bilaterally from T12 through S2, stable since prior examination. As noted above, the fracture extends through the posterior elements at L3-4 with distraction and accentuated lordosis at this level. No other fracture identified. Paraspinal and other soft tissues: There is acute paraspinal, intramuscular, and retroperitoneal hemorrhage surrounding the fracture at L3-4 while not optimally assessed and epidural hematoma is suspected at this level, best seen on image # 92/6, with moderate canal stenosis. Disc levels: Intervertebral disc heights are narrowed or obliterated with associated endplate remodeling in keeping with changes of advanced degenerative disc disease throughout the visualized thoracolumbar spine. Epidural hematoma may impinge upon crossing L4 nerve roots. Mature callus results in high-grade left neuroforaminal narrowing L2-3. Milder narrowing noted bilaterally T12-L1 and the right at L1-2. IMPRESSION: 1. Acute hyperextension injury type fracture through the superior vertebral body of L4 and extending through the posterior elements bilaterally with distraction of the vertebral  fracture fragments by approximately 18 mm and anterior translation of the superior component of the L4 vertebral body by approximately 8-9 mm. This is best characterized as a Spine AO class C fracture and emergent surgical consultation for stabilization is recommended. 2. Acute paraspinal, intramuscular, and retroperitoneal hemorrhage surrounding the fracture at L3-4 while not optimally assessed, epidural hematoma is suspected at this level, with moderate canal stenosis. Epidural hematoma may impinge upon crossing L4 nerve roots. Dedicated MRI examination would be more helpful for for further evaluation. 3. Ankylosis of posterior elements T12-S2. 4. Diffuse osteopenia. 5. Advanced degenerative disc and degenerative joint disease resulting in multilevel neuroforaminal narrowing, most severe on the left at L2-3. Electronically Signed: By: Helyn Numbers M.D. On: 11/13/2023 20:46   CT Hip Right Wo Contrast Result Date: 11/13/2023 CLINICAL DATA:  Trauma EXAM: CT OF THE RIGHT HIP WITHOUT CONTRAST TECHNIQUE: Multidetector  CT imaging of the right hip was performed according to the standard protocol. Multiplanar CT image reconstructions were also generated. RADIATION DOSE REDUCTION: This exam was performed according to the departmental dose-optimization program which includes automated exposure control, adjustment of the mA and/or kV according to patient size and/or use of iterative reconstruction technique. COMPARISON:  CT abdomen and pelvis 10/16/2022 FINDINGS: Bones/Joint/Cartilage The bones are diffusely osteopenic. Right-sided hip screw is present. There is no evidence for hardware loosening. No acute fracture visualized. No dislocation. There is moderate joint space narrowing of the right hip, pubic symphysis and sacroiliac joint compatible with degenerative change. Ligaments Suboptimally assessed by CT. Muscles and Tendons No intramuscular hematoma.  Vascular calcifications are present. Soft tissues No focal  hematoma or fluid collection identified. There is a fat containing ventral hernia which is partially imaged. IMPRESSION: 1. No acute fracture or dislocation of the right hip. 2. Right-sided hip screw without evidence for hardware loosening. 3. Moderate degenerative changes of the right hip, pubic symphysis and sacroiliac joint. 4. Osteopenia. Electronically Signed   By: Darliss Cheney M.D.   On: 11/13/2023 20:37   CT Cervical Spine Wo Contrast Result Date: 11/13/2023 CLINICAL DATA:  Neck trauma.  Pain after fall. EXAM: CT CERVICAL SPINE WITHOUT CONTRAST TECHNIQUE: Multidetector CT imaging of the cervical spine was performed without intravenous contrast. Multiplanar CT image reconstructions were also generated. RADIATION DOSE REDUCTION: This exam was performed according to the departmental dose-optimization program which includes automated exposure control, adjustment of the mA and/or kV according to patient size and/or use of iterative reconstruction technique. COMPARISON:  Cervical spine CT 02/22/2023 FINDINGS: Alignment: No traumatic subluxation. Chronic grade 1 anterolisthesis of C3 on C4 and retrolisthesis of C4 on C5. Skull base and vertebrae: No acute fracture. Vertebral body heights are maintained. The dens and skull base are intact. None fusion posterior arch of C1. Moderate pannus at C1-C2. Soft tissues and spinal canal: No prevertebral fluid or swelling. No visible canal hematoma. Disc levels: Advanced diffuse degenerative disc disease and facet hypertrophy. Degenerative changes are grossly stable from prior. Upper chest: Heterogeneous pulmonary parenchyma which is in part chronic, but more prominent than on prior exam, raising concern for pulmonary edema. Other: Carotid calcifications. IMPRESSION: 1. No acute fracture or subluxation of the cervical spine. 2. Advanced diffuse degenerative disc disease and facet hypertrophy, grossly stable from prior. 3. Heterogeneous pulmonary parenchyma which is in part  chronic, but more prominent than on prior exam, raising concern for pulmonary edema. Electronically Signed   By: Narda Rutherford M.D.   On: 11/13/2023 20:37   CT HEAD WO CONTRAST ( ) Result Date: 11/13/2023 CLINICAL DATA:  Pain after fall. EXAM: CT HEAD WITHOUT CONTRAST TECHNIQUE: Contiguous axial images were obtained from the base of the skull through the vertex without intravenous contrast. RADIATION DOSE REDUCTION: This exam was performed according to the departmental dose-optimization program which includes automated exposure control, adjustment of the mA and/or kV according to patient size and/or use of iterative reconstruction technique. COMPARISON:  Head CT 07/29/2023 FINDINGS: Brain: Patient is tilted in the scanner. No intracranial hemorrhage, mass effect, or midline shift. Age related atrophy. No hydrocephalus. The basilar cisterns are patent. No evidence of territorial infarct or acute ischemia. No extra-axial or intracranial fluid collection. Vascular: Atherosclerosis of skullbase vasculature without hyperdense vessel or abnormal calcification. Skull: No fracture or focal lesion. Sinuses/Orbits: Chronic opacification of left side of sphenoid sinus. No acute fracture. Bilateral cataract resection. Other: None. IMPRESSION: 1. No acute intracranial abnormality. No skull  fracture. 2. Age related atrophy. Electronically Signed   By: Narda Rutherford M.D.   On: 11/13/2023 20:33     PROCEDURES:  Critical Care performed: No  Procedures   MEDICATIONS ORDERED IN ED: Medications  fentaNYL (SUBLIMAZE) injection 50 mcg (50 mcg Intravenous Given 11/13/23 1636)  morphine (PF) 4 MG/ML injection 4 mg (4 mg Intravenous Given 11/13/23 1813)  morphine (PF) 4 MG/ML injection 4 mg (4 mg Intravenous Given 11/13/23 2111)  morphine (PF) 4 MG/ML injection 4 mg (4 mg Intravenous Given 11/13/23 2342)     IMPRESSION / MDM / ASSESSMENT AND PLAN / ED COURSE  I reviewed the triage vital signs and the nursing  notes.                              Differential diagnosis includes, but is not limited to, SDH, cervical fracture, cervical dislocation, lumbar fracture, lumbar radiculopathy, hip fracture, hip dislocation, hardware disruption  Patient's presentation is most consistent with acute presentation with potential threat to life or bodily function.  Patient's diagnosis is consistent with a fracture at L4 with 18 mm of distraction, with retroperitoneal hematoma related to the L3 fracture.  Geriatric patient with a history of ankylosing spondylitis and severe spinal fusion, presents to the ED after a mechanical fall where she landed backwards while sitting in her rollator and her caregiver landed on top of her.  Patient denies any head injury or LOC.  CT imaging of the head, neck, and hip were reviewed by me and negative at this time.   ----------------------------------------- 9:53 PM on 11/13/2023 ----------------------------------------- Discussed the case with Dr. Ernestine Mcmurray (neurosurgery) he reviewed the images, and discussed the treatment options with the patient and her adult daughter Kimberly Roy) who is at bedside.  Options included nonsurgical stabilization and potential transfer to home versus skilled nursing facility as well as potential surgical intervention with a high risk of mortality and morbidity including spinal cord injury, further spinal fracture, stroke, cardiac arrest, etc. Patient and family requesting transfer to [facility] for surgical consultation.  ----------------------------------------- 11:30 PM on 11/14/2023 ----------------------------------------- [facility]) transfer center. Case will have to be reviewed by the Admin Review Team when they convene at 6:30 am, before being forwarded to the Neurosurgery Team for final disposition on acceptance.  Patient care will be transferred to my attending, D. Wells, pending contact from [facility] center in the morning.   FINAL  CLINICAL IMPRESSION(S) / ED DIAGNOSES   Final diagnoses:  Closed fracture of fourth lumbar vertebra, unspecified fracture morphology, initial encounter (HCC)  Fall, initial encounter     Rx / DC Orders   ED Discharge Orders     None        Note:  This document was prepared using Dragon voice recognition software and may include unintentional dictation errors.    Lissa Hoard, PA-C 11/14/23 0037    Trinna Post, MD 11/15/23 [phone]

## 2023-11-14 DIAGNOSIS — S36899A Unspecified injury of other intra-abdominal organs, initial encounter: Secondary | ICD-10-CM | POA: Diagnosis not present

## 2023-11-14 DIAGNOSIS — Z96653 Presence of artificial knee joint, bilateral: Secondary | ICD-10-CM | POA: Diagnosis not present

## 2023-11-14 DIAGNOSIS — I509 Heart failure, unspecified: Secondary | ICD-10-CM | POA: Diagnosis not present

## 2023-11-14 DIAGNOSIS — M546 Pain in thoracic spine: Secondary | ICD-10-CM | POA: Diagnosis not present

## 2023-11-14 DIAGNOSIS — I4891 Unspecified atrial fibrillation: Secondary | ICD-10-CM | POA: Diagnosis not present

## 2023-11-14 DIAGNOSIS — K828 Other specified diseases of gallbladder: Secondary | ICD-10-CM | POA: Diagnosis not present

## 2023-11-14 DIAGNOSIS — W19XXXA Unspecified fall, initial encounter: Secondary | ICD-10-CM | POA: Diagnosis not present

## 2023-11-14 DIAGNOSIS — K838 Other specified diseases of biliary tract: Secondary | ICD-10-CM | POA: Diagnosis not present

## 2023-11-14 DIAGNOSIS — M545 Low back pain, unspecified: Secondary | ICD-10-CM | POA: Diagnosis not present

## 2023-11-14 DIAGNOSIS — S32042A Unstable burst fracture of fourth lumbar vertebra, initial encounter for closed fracture: Secondary | ICD-10-CM | POA: Diagnosis not present

## 2023-11-14 DIAGNOSIS — M549 Dorsalgia, unspecified: Secondary | ICD-10-CM

## 2023-11-14 DIAGNOSIS — J9601 Acute respiratory failure with hypoxia: Secondary | ICD-10-CM | POA: Diagnosis not present

## 2023-11-14 DIAGNOSIS — S064X0A Epidural hemorrhage without loss of consciousness, initial encounter: Secondary | ICD-10-CM | POA: Diagnosis not present

## 2023-11-14 DIAGNOSIS — I517 Cardiomegaly: Secondary | ICD-10-CM | POA: Diagnosis not present

## 2023-11-14 DIAGNOSIS — J9811 Atelectasis: Secondary | ICD-10-CM | POA: Diagnosis not present

## 2023-11-14 DIAGNOSIS — K683 Retroperitoneal hematoma: Secondary | ICD-10-CM | POA: Diagnosis not present

## 2023-11-14 DIAGNOSIS — E039 Hypothyroidism, unspecified: Secondary | ICD-10-CM | POA: Diagnosis not present

## 2023-11-14 DIAGNOSIS — S32002A Unstable burst fracture of unspecified lumbar vertebra, initial encounter for closed fracture: Secondary | ICD-10-CM | POA: Diagnosis not present

## 2023-11-14 DIAGNOSIS — M25551 Pain in right hip: Secondary | ICD-10-CM | POA: Diagnosis not present

## 2023-11-14 DIAGNOSIS — S32000A Wedge compression fracture of unspecified lumbar vertebra, initial encounter for closed fracture: Secondary | ICD-10-CM | POA: Diagnosis not present

## 2023-11-14 DIAGNOSIS — S32049A Unspecified fracture of fourth lumbar vertebra, initial encounter for closed fracture: Secondary | ICD-10-CM

## 2023-11-14 DIAGNOSIS — Z981 Arthrodesis status: Secondary | ICD-10-CM | POA: Diagnosis not present

## 2023-11-14 DIAGNOSIS — Z7901 Long term (current) use of anticoagulants: Secondary | ICD-10-CM | POA: Diagnosis not present

## 2023-11-14 DIAGNOSIS — S3992XA Unspecified injury of lower back, initial encounter: Secondary | ICD-10-CM | POA: Diagnosis not present

## 2023-11-14 DIAGNOSIS — G8929 Other chronic pain: Secondary | ICD-10-CM

## 2023-11-14 DIAGNOSIS — I10 Essential (primary) hypertension: Secondary | ICD-10-CM | POA: Diagnosis not present

## 2023-11-14 DIAGNOSIS — S32048A Other fracture of fourth lumbar vertebra, initial encounter for closed fracture: Secondary | ICD-10-CM | POA: Diagnosis not present

## 2023-11-14 DIAGNOSIS — R58 Hemorrhage, not elsewhere classified: Secondary | ICD-10-CM | POA: Diagnosis not present

## 2023-11-14 DIAGNOSIS — Z515 Encounter for palliative care: Secondary | ICD-10-CM | POA: Diagnosis not present

## 2023-11-14 DIAGNOSIS — R519 Headache, unspecified: Secondary | ICD-10-CM | POA: Diagnosis not present

## 2023-11-14 MED ORDER — FENTANYL CITRATE PF 50 MCG/ML IJ SOSY
75.0000 ug | PREFILLED_SYRINGE | Freq: Once | INTRAMUSCULAR | Status: AC
  Administered 2023-11-14: 75 ug via INTRAVENOUS
  Filled 2023-11-14: qty 2

## 2023-11-14 MED ORDER — MORPHINE SULFATE (PF) 4 MG/ML IV SOLN: 8.0000 mg | Freq: Once | INTRAVENOUS | Status: DC

## 2023-11-14 MED ORDER — METOPROLOL SUCCINATE ER 25 MG PO TB24
25.0000 mg | ORAL_TABLET | Freq: Every day | ORAL | Status: DC
  Administered 2023-11-14: 25 mg via ORAL
  Filled 2023-11-14: qty 1

## 2023-11-14 MED ORDER — SODIUM CHLORIDE 0.9 % IV BOLUS
1000.0000 mL | Freq: Once | INTRAVENOUS | Status: AC
  Administered 2023-11-14: 1000 mL via INTRAVENOUS

## 2023-11-14 MED ORDER — ONDANSETRON HCL 4 MG PO TABS: 4.0000 mg | ORAL_TABLET | Freq: Four times a day (QID) | ORAL | Status: DC | PRN

## 2023-11-14 MED ORDER — FENTANYL CITRATE PF 50 MCG/ML IJ SOSY
25.0000 ug | PREFILLED_SYRINGE | INTRAMUSCULAR | Status: DC | PRN
  Administered 2023-11-14: 25 ug via INTRAVENOUS
  Filled 2023-11-14: qty 1

## 2023-11-14 MED ORDER — PANTOPRAZOLE SODIUM 40 MG PO TBEC
40.0000 mg | DELAYED_RELEASE_TABLET | Freq: Two times a day (BID) | ORAL | Status: DC
  Administered 2023-11-14: 40 mg via ORAL
  Filled 2023-11-14: qty 1

## 2023-11-14 MED ORDER — ONDANSETRON HCL 4 MG/2ML IJ SOLN
4.0000 mg | Freq: Four times a day (QID) | INTRAMUSCULAR | Status: DC | PRN
Start: 2023-11-14 — End: 2023-11-14

## 2023-11-14 MED ORDER — FENTANYL CITRATE PF 50 MCG/ML IJ SOSY
50.0000 ug | PREFILLED_SYRINGE | Freq: Once | INTRAMUSCULAR | Status: AC
  Administered 2023-11-14: 50 ug via INTRAVENOUS
  Filled 2023-11-14: qty 1

## 2023-11-14 MED ORDER — AMIODARONE HCL 200 MG PO TABS
200.0000 mg | ORAL_TABLET | Freq: Once | ORAL | Status: AC
  Administered 2023-11-14: 200 mg via ORAL
  Filled 2023-11-14: qty 1

## 2023-11-14 MED ORDER — MORPHINE SULFATE (PF) 4 MG/ML IV SOLN
4.0000 mg | Freq: Once | INTRAVENOUS | Status: AC
  Administered 2023-11-14: 4 mg via INTRAVENOUS
  Filled 2023-11-14: qty 1

## 2023-11-14 MED ORDER — AMIODARONE HCL 200 MG PO TABS: 200.0000 mg | ORAL_TABLET | Freq: Every day | ORAL | Status: DC

## 2023-11-14 MED ORDER — FENTANYL CITRATE PF 50 MCG/ML IJ SOSY: 50.0000 ug | PREFILLED_SYRINGE | INTRAMUSCULAR | Status: DC | PRN

## 2023-11-14 MED ORDER — LORAZEPAM 2 MG/ML IJ SOLN
1.0000 mg | Freq: Once | INTRAMUSCULAR | Status: AC
  Administered 2023-11-14: 1 mg via INTRAVENOUS
  Filled 2023-11-14: qty 1

## 2023-11-14 MED ORDER — METOPROLOL TARTRATE 25 MG PO TABS
25.0000 mg | ORAL_TABLET | Freq: Once | ORAL | Status: AC
  Administered 2023-11-14: 25 mg via ORAL
  Filled 2023-11-14: qty 1

## 2023-11-14 NOTE — Assessment & Plan Note (Addendum)
 Decompensated severe back pain in the setting of lumbar fracture Currently on IV fentanyl

## 2023-11-14 NOTE — ED Notes (Signed)
 Called [facility] spoke with rep. [employer], rep stated she is sending a truck the ETA is 10 minutes.

## 2023-11-14 NOTE — ED Provider Notes (Addendum)
-----------------------------------------   8:26 AM on 11/14/2023 -----------------------------------------  I took over care of this patient from Dr. Anner Crete.  I received call back from Dr. Asa Lente from neurosurgery at [facility].  He advises that based on the described imaging findings, he would try to avoid surgery unless absolutely necessary.  He initially discussed the ability of placing a corset brace and having the patient follow-up in clinic however I advised that the patient was in too much pain and would need to be admitted.  He recommended that we have the patient fitted for the brace and then perform a standing x-ray to determine if there is instability.  He also suggested an MRI.  He advised that there is no specific indication for transfer at this time, however he would consider surgical intervention depending on the results of these additional studies, if the fracture is felt to be unstable.  I updated Dr. Katrinka Blazing from neurosurgery here about these recommendations and have asked for jim to consult on the patient.  The patient will need to be fitted for the brace before the x-rays can be obtained.  She may not be able to get an MRI here as she has an older type spinal stimulator.  At this time the patient has been in the ED for 18 hours.  Given that there is no acute recommendation for transfer to [facility] and she still needs additional workup as well as treatment for her pain, I discussed with the family the option for how to proceed.  They agreed that we admit the patient here, have neurosurgery see her here, and then obtain the additional workup.  The patient has a hematoma on the CT but last had Eliquis yesterday at around 8 AM, now over 24 hours ago, so there is no indication for reversal.  I consulted and discussed the case with Dr. Alvester Morin from the hospitalist service; based on our discussion he agreed to evaluate the patient for admission.     ----------------------------------------- 10:03  AM on 11/14/2023 -----------------------------------------  Dr. Katrinka Blazing has evaluated the patient.  He feels that the fracture is very unstable.  He does not recommend a standing x-ray and wants the patient in a full TLSO brace.  He contacted Dr. Asa Lente at [facility] and they discussed the case; Dr. Asa Lente now recommends transfer to [facility] for further management.  The family is still in agreement with transfer.  The patient will go ED to ED.  She is stable for transfer at this time.       Dionne Bucy, MD 11/14/23 1006

## 2023-11-14 NOTE — ED Notes (Signed)
 Pt is consistently remains in Afib with a hx of same. Rates are sustaining around 140 and pt is asymptomatic. Daughter states that her normal rates are below 120. Dr. Anner Crete made aware.

## 2023-11-14 NOTE — Assessment & Plan Note (Addendum)
 Epidural hematoma  L4 nerve impingement  Noted extensive lumbar compression fracture with compression fragments noted-actively unstable  Also w/ Acute paraspinal, intramuscular, and retroperitoneal hemorrhage surrounding the fracture at L3-4 while not optimally assessed, epidural hematoma is suspected at this level, with moderate canal stenosis. Epidural hematoma may impinge upon crossing L4 nerve roots  Case preliminarily discussed with Dr. Katrinka Blazing per Dr. Marisa Severin as well as neurosurgery with [facility] Pt to be transferred to [facility] for further management given fracture instability

## 2023-11-14 NOTE — Consult Note (Signed)
 Consulting Department:  Emergency department  Primary Physician:  Dale Grand Rivers, MD  Chief Complaint: Unstable spinal fracture.  History of Present Illness: 11/14/2023 Kimberly Roy is a 89 y.o. female who presents with the chief complaint of unstable spinal fracture.  She has a history of pulmonary hypertension, congestive heart failure, and spinal ankylosis with autofusion who had a fall complicated by severe pain.  She came into the emergency department was found to have a unstable spinal fracture with associated retroperitoneal and epidural hematoma.  Prior to admission she was independent at home with a walker. She currently has severe pain, is not having any lower extremity weakness numbness or tingling.  No bowel or bladder incontinence.  The symptoms are causing a significant impact on the patient's life.   Review of Systems:  A 10 point review of systems is negative, except for the pertinent positives and negatives detailed in the HPI.  Past Medical History: Past Medical History:  Diagnosis Date   Anemia    Anxiety    Chest pain    CHF (congestive heart failure) (HCC)    Constipation    DDD (degenerative disc disease), cervical    Depression    DVT (deep venous thrombosis) (HCC)    Dysphonia    Dyspnea    Fatty liver    Fatty liver    Headache    Hyperlipidemia    Hyperpiesia    Hypertension    Hypothyroidism    Interstitial cystitis    Left ventricular dysfunction    Lymphedema    Nephrolithiasis    Obstructive sleep apnea    Osteoarthritis    knees/cervical and lumbar spine   Pulmonary hypertension (HCC)    Pulmonary nodules    followed by Dr Meredeth Ide   Pure hypercholesterolemia    Renal cyst    right    Past Surgical History: Past Surgical History:  Procedure Laterality Date   ABDOMINAL HYSTERECTOMY     ovaries left in place   APPENDECTOMY     Back Surgeries     BACK SURGERY     BREAST REDUCTION SURGERY     3/99   CARDIAC CATHETERIZATION      cataracts Bilateral    CERVICAL SPINE SURGERY     ESOPHAGEAL MANOMETRY N/A 08/02/2015   Procedure: ESOPHAGEAL MANOMETRY (EM);  Surgeon: Elnita Maxwell, MD;  Location: [facility] ENDOSCOPY;  Service: Endoscopy;  Laterality: N/A;   ESOPHAGOGASTRODUODENOSCOPY N/A 02/27/2015   Procedure: ESOPHAGOGASTRODUODENOSCOPY (EGD);  Surgeon: Wallace Cullens, MD;  Location: [facility] ENDOSCOPY;  Service: Gastroenterology;  Laterality: N/A;   ESOPHAGOGASTRODUODENOSCOPY (EGD) WITH PROPOFOL N/A 10/30/2018   Procedure: ESOPHAGOGASTRODUODENOSCOPY (EGD) WITH PROPOFOL;  Surgeon: Christena Deem, MD;  Location: [facility] ENDOSCOPY;  Service: Endoscopy;  Laterality: N/A;   EXCISIONAL HEMORRHOIDECTOMY     EYE SURGERY     FRACTURE SURGERY     HEMORRHOID SURGERY     HIP SURGERY  2013   Right hip surgery   JOINT REPLACEMENT     KNEE ARTHROSCOPY     left and right   ORIF FEMUR FRACTURE Right 08/07/2018   Procedure: OPEN REDUCTION INTERNAL FIXATION (ORIF) DISTAL FEMUR FRACTURE;  Surgeon: Kennedy Bucker, MD;  Location: [facility] ORS;  Service: Orthopedics;  Laterality: Right;   REDUCTION MAMMAPLASTY Bilateral YRS AGO   REPLACEMENT TOTAL KNEE Bilateral    rotator cuff surgery     blilateral   TONSILECTOMY/ADENOIDECTOMY WITH MYRINGOTOMY     VISCERAL ARTERY INTERVENTION N/A 02/25/2019   Procedure: VISCERAL ARTERY INTERVENTION;  Surgeon: Annice Needy, MD;  Location: [facility] INVASIVE CV LAB;  Service: Cardiovascular;  Laterality: N/A;    Allergies: Allergies as of 11/13/2023 - Review Complete 11/13/2023  Allergen Reaction Noted   Lyrica [pregabalin] Swelling 07/03/2012   Omnicef [cefdinir] Diarrhea and Nausea And Vomiting 12/10/2019   Atarax [hydroxyzine]  08/15/2015   Dicyclomine Other (See Comments) 01/20/2016   Hydroxyzine hcl  11/08/2015   Levaquin [levofloxacin] Swelling 07/03/2012   Nitrofurantoin Diarrhea 12/02/2017   Nucynta er [tapentadol hcl er] Other (See Comments) 07/04/2012   Oxybutynin Other (See Comments) 01/20/2016   Zoloft  [sertraline hcl]  08/30/2014   Biaxin [clarithromycin] Other (See Comments) and Rash 07/03/2012   Meloxicam Rash 03/22/2022   Other Rash 01/19/2014   Sertraline Nausea And Vomiting 01/13/2015   Silicone Other (See Comments) and Rash 01/19/2014   Sulfa antibiotics Rash 07/03/2012   Sulfasalazine Rash 07/03/2012   Tape Rash 11/08/2015   Tapentadol Other (See Comments) and Rash 11/08/2015   Venofer [iron sucrose] Rash 03/19/2022    Medications:  Current Facility-Administered Medications:    [START ON 11/15/2023] amiodarone (PACERONE) tablet 200 mg, 200 mg, Oral, Daily, Floydene Flock, MD   fentaNYL (SUBLIMAZE) injection 25 mcg, 25 mcg, Intravenous, Q2H PRN, Floydene Flock, MD   metoprolol succinate (TOPROL-XL) 24 hr tablet 25 mg, 25 mg, Oral, Daily, Floydene Flock, MD, 25 mg at 11/14/23 0931   ondansetron (ZOFRAN) tablet 4 mg, 4 mg, Oral, Q6H PRN **OR** ondansetron (ZOFRAN) injection 4 mg, 4 mg, Intravenous, Q6H PRN, Floydene Flock, MD   pantoprazole (PROTONIX) EC tablet 40 mg, 40 mg, Oral, BID, Floydene Flock, MD, 40 mg at 11/14/23 7829  Current Outpatient Medications:    albuterol (VENTOLIN HFA) 108 (90 Base) MCG/ACT inhaler, INHALE 2 PUFFS INTO THE LUNGS EVERY 6 HOURS AS NEEDED FOR WHEEZING OR SHORTNESS OF BREATH, Disp: 18 g, Rfl: 0   aluminum-magnesium hydroxide-simethicone (MAALOX) 200-200-20 MG/5ML SUSP, Take 15 mLs by mouth 4 (four) times daily -  before meals and at bedtime. , Disp: , Rfl:    amiodarone (PACERONE) 200 MG tablet, Take 1 tablet (200 mg total) by mouth daily., Disp: 60 tablet, Rfl: 2   amLODipine (NORVASC) 10 MG tablet, Take 1 tablet (10 mg total) by mouth daily., Disp: 90 tablet, Rfl: 1   apixaban (ELIQUIS) 5 MG TABS tablet, Take 5 mg by mouth 2 (two) times daily., Disp: , Rfl:    atorvastatin (LIPITOR) 20 MG tablet, TAKE 1 TABLET BY MOUTH AT BEDTIME, Disp: 90 tablet, Rfl: 1   benazepril (LOTENSIN) 40 MG tablet, Take 1 tablet (40 mg total) by mouth daily.,  Disp: 90 tablet, Rfl: 1   betamethasone dipropionate 0.05 % cream, Apply 1 application topically 2 (two) times daily., Disp: , Rfl:    budesonide-formoterol (SYMBICORT) 80-4.5 MCG/ACT inhaler, Inhale 2 puffs into the lungs in the morning and at bedtime., Disp: 1 each, Rfl: 12   busPIRone (BUSPAR) 10 MG tablet, Take 10 mg by mouth 3 (three) times daily., Disp: , Rfl:    calcium citrate-vitamin D (CITRACAL+D) 315-200 MG-UNIT tablet, Take 2 tablets by mouth daily., Disp: , Rfl:    Cholecalciferol (VITAMIN D3) 1000 UNITS CAPS, Take 1 capsule by mouth daily., Disp: , Rfl:    clobetasol cream (TEMOVATE) 0.05 %, Apply 1 application topically See admin instructions. Apply to affected areas of body 1 - 2 times daily as needed for itchy bumps. Avoid applying to face, groin, and axilla. Use as directed., Disp: 60 g,  Rfl: 1   cyanocobalamin (VITAMIN B12) 1000 MCG/ML injection, INJECT 1 ML INTO THE MUSCLE EVERY 3 MONTHS., Disp: 12 mL, Rfl: 0   Diclofenac Sodium 3 % GEL, Apply topically 2 (two) times daily., Disp: , Rfl:    Docusate Sodium (DSS) 100 MG CAPS, Take 100 mg by mouth 2 (two) times daily., Disp: , Rfl:    DULoxetine (CYMBALTA) 20 MG capsule, Take 20 mg by mouth daily., Disp: , Rfl:    DULoxetine (CYMBALTA) 60 MG capsule, Take 60 mg by mouth daily., Disp: , Rfl:    Fluocinolone Acetonide 0.01 % OIL, Apply 1-2 drops into ears once to twice daily as needed., Disp: 20 mL, Rfl: 3   fluocinonide (LIDEX) 0.05 % external solution, APPLY TOPICALLY TO SCALP 2 TIMES DAILY FOR ITCH, Disp: 60 mL, Rfl: 3   fluticasone (FLONASE) 50 MCG/ACT nasal spray, Place 2 sprays into both nostrils daily., Disp: 16 g, Rfl: 6   furosemide (LASIX) 20 MG tablet, TAKE 1 TABLET BY MOUTH DAILY AS NEEDED (Patient taking differently: Take 20 mg by mouth daily.), Disp: 90 tablet, Rfl: 0   gabapentin (NEURONTIN) 300 MG capsule, TAKE 2 CAPSULES BY MOUTH 3 TIMES A DAY (IN THE MORNING, MIDDAY AND AT BEDTIME)., Disp: 540 capsule, Rfl: 1    HYDROcodone-acetaminophen (NORCO) 10-325 MG tablet, Take 1 tablet by mouth 3 (three) times daily., Disp: 10 tablet, Rfl: 0   Infant Care Products (DERMACLOUD) OINT, Apply topically as directed (as discussed)., Disp: 430 g, Rfl: 1   ipratropium (ATROVENT) 0.03 % nasal spray, Place 2 sprays into both nostrils every 12 (twelve) hours., Disp: 30 mL, Rfl: 1   ketoconazole (NIZORAL) 2 % shampoo, USE AS A SHAMPOO 3 TIMES PER WEEK. MASSAGE INTO SCALP AND LEAVE IN FOR 10 MINUTES BEFORE RINSING OUT., Disp: 120 mL, Rfl: 3   levalbuterol (XOPENEX) 0.63 MG/3ML nebulizer solution, Take 3 mLs (0.63 mg total) by nebulization every 6 (six) hours as needed for wheezing or shortness of breath., Disp: 3 mL, Rfl: 1   LINZESS 290 MCG CAPS capsule, Take 290 mcg by mouth daily., Disp: , Rfl:    magnesium oxide (MAG-OX) 400 MG tablet, Take 1 tablet (400 mg total) by mouth daily., Disp: 30 tablet, Rfl: 1   metoprolol succinate (TOPROL-XL) 25 MG 24 hr tablet, Take 25 mg by mouth daily., Disp: , Rfl:    morphine (MS CONTIN) 30 MG 12 hr tablet, Take 30 mg by mouth daily. 1400, Disp: , Rfl:    morphine (MS CONTIN) 60 MG 12 hr tablet, Take 1 tablet (60 mg total) by mouth 2 (two) times daily., Disp: 10 tablet, Rfl: 0   mupirocin ointment (BACTROBAN) 2 %, Apply 1 Application topically daily. Qd to excision site, Disp: 22 g, Rfl: 1   nystatin (MYCOSTATIN/NYSTOP) powder, Apply 1 application. topically 2 (two) times daily as needed., Disp: 60 g, Rfl: 0   nystatin cream (MYCOSTATIN), Apply 1 Application topically 2 (two) times daily., Disp: 60 g, Rfl: 0   pantoprazole (PROTONIX) 40 MG tablet, Take 1 tablet (40 mg total) by mouth 2 (two) times daily., Disp: 180 tablet, Rfl: 1   polyethylene glycol powder (GLYCOLAX/MIRALAX) 17 GM/SCOOP powder, MIX 17 GRAMS AS MARKED ON BOTTLE TOP IN 8 OUNCES OF WATER AND DRINK ONCE A DAY AS DIRECTED., Disp: 527 g, Rfl: 0   Simethicone (GAS-X PO), Take 2 tablets by mouth 2 (two) times daily., Disp: , Rfl:     sodium chloride (OCEAN) 0.65 % nasal spray, Place 1  spray into the nose as needed., Disp: , Rfl:    SYNTHROID 100 MCG tablet, TAKE 1 TABLET BY MOUTH DAILY, Disp: 90 tablet, Rfl: 3   triamcinolone ointment (KENALOG) 0.1 %, APPLY TWICE DAILY TO BITES AND RASH UNTIL FLAT AND SMOOTH **DO NOT APPLY TO FACE**, Disp: 80 g, Rfl: 0   Social History: Social History   Tobacco Use   Smoking status: Never   Smokeless tobacco: Never  Vaping Use   Vaping status: Never Used  Substance Use Topics   Alcohol use: No    Alcohol/week: 0.0 standard drinks of alcohol   Drug use: Never    Family Medical History: Family History  Problem Relation Age of Onset   Heart disease Mother    Stroke Mother    Hypertension Mother    Heart disease Father        myocardial infarction age 71   Breast cancer Neg Hx     Physical Examination: Vitals:   11/14/23 0900 11/14/23 0932  BP: (!) 114/53   Pulse: (!) 122   Resp: 18   Temp:  99.1 F (37.3 C)  SpO2: 92%      General: Patient appears stated age, calm, collected, but appears to be in pain  NEUROLOGICAL:  General: I appears to be in pain Awake, alert, oriented to person, place, and time.  Pupils equal round and reactive to light.  Facial tone is symmetric.  Tongue protrusion is midline.  There is no pronator drift.  ROM of spine: On bedrest with strict spine precautions  Strength: She has significant pain in her back which is limiting her examination, however she is antigravity at the bilateral knees and resists to at least a 4-4+ level, her plantarflexion dorsiflexion and EHL function is at least 4+ bilaterally.  She acknowledges sensation in her bilateral lower extremities.  She had some pain with reflex examination so unable to clearly document.  Imaging: CT Lumbar Spine Wo Contrast Addendum Date: 11/13/2023 ADDENDUM REPORT: 11/13/2023 21:22 ADDENDUM: These results were called by telephone at the time of interpretation on 11/13/2023 at  8:37 pm to provider Ray, MD, who verbally acknowledged these results. Electronically Signed   By: Helyn Numbers M.D.   On: 11/13/2023 21:22   Result Date: 11/13/2023 CLINICAL DATA:  Low back pain, fall, back injury EXAM: CT LUMBAR SPINE WITHOUT CONTRAST TECHNIQUE: Multidetector CT imaging of the lumbar spine was performed without intravenous contrast administration. Multiplanar CT image reconstructions were also generated. RADIATION DOSE REDUCTION: This exam was performed according to the departmental dose-optimization program which includes automated exposure control, adjustment of the mA and/or kV according to patient size and/or use of iterative reconstruction technique. COMPARISON:  09/12/2017 FINDINGS: Segmentation: 5 lumbar type vertebrae. Alignment: There is acute hyperextension injury type fracture through the superior vertebral body of L4 and extending through the posterior elements bilaterally with distraction of the vertebral fracture fragments by approximately 18 mm and anterior translation of the superior component of the L4 vertebral body by approximately 8-9 mm. Vertebrae: The osseous structures are diffusely osteopenic. There is ankylosis of the facet joints bilaterally from T12 through S2, stable since prior examination. As noted above, the fracture extends through the posterior elements at L3-4 with distraction and accentuated lordosis at this level. No other fracture identified. Paraspinal and other soft tissues: There is acute paraspinal, intramuscular, and retroperitoneal hemorrhage surrounding the fracture at L3-4 while not optimally assessed and epidural hematoma is suspected at this level, best seen on image # 92/6,  with moderate canal stenosis. Disc levels: Intervertebral disc heights are narrowed or obliterated with associated endplate remodeling in keeping with changes of advanced degenerative disc disease throughout the visualized thoracolumbar spine. Epidural hematoma may impinge upon  crossing L4 nerve roots. Mature callus results in high-grade left neuroforaminal narrowing L2-3. Milder narrowing noted bilaterally T12-L1 and the right at L1-2. IMPRESSION: 1. Acute hyperextension injury type fracture through the superior vertebral body of L4 and extending through the posterior elements bilaterally with distraction of the vertebral fracture fragments by approximately 18 mm and anterior translation of the superior component of the L4 vertebral body by approximately 8-9 mm. This is best characterized as a Spine AO class C fracture and emergent surgical consultation for stabilization is recommended. 2. Acute paraspinal, intramuscular, and retroperitoneal hemorrhage surrounding the fracture at L3-4 while not optimally assessed, epidural hematoma is suspected at this level, with moderate canal stenosis. Epidural hematoma may impinge upon crossing L4 nerve roots. Dedicated MRI examination would be more helpful for for further evaluation. 3. Ankylosis of posterior elements T12-S2. 4. Diffuse osteopenia. 5. Advanced degenerative disc and degenerative joint disease resulting in multilevel neuroforaminal narrowing, most severe on the left at L2-3. Electronically Signed: By: Helyn Numbers M.D. On: 11/13/2023 20:46   CT Hip Right Wo Contrast Result Date: 11/13/2023 CLINICAL DATA:  Trauma EXAM: CT OF THE RIGHT HIP WITHOUT CONTRAST TECHNIQUE: Multidetector CT imaging of the right hip was performed according to the standard protocol. Multiplanar CT image reconstructions were also generated. RADIATION DOSE REDUCTION: This exam was performed according to the departmental dose-optimization program which includes automated exposure control, adjustment of the mA and/or kV according to patient size and/or use of iterative reconstruction technique. COMPARISON:  CT abdomen and pelvis 10/16/2022 FINDINGS: Bones/Joint/Cartilage The bones are diffusely osteopenic. Right-sided hip screw is present. There is no evidence  for hardware loosening. No acute fracture visualized. No dislocation. There is moderate joint space narrowing of the right hip, pubic symphysis and sacroiliac joint compatible with degenerative change. Ligaments Suboptimally assessed by CT. Muscles and Tendons No intramuscular hematoma.  Vascular calcifications are present. Soft tissues No focal hematoma or fluid collection identified. There is a fat containing ventral hernia which is partially imaged. IMPRESSION: 1. No acute fracture or dislocation of the right hip. 2. Right-sided hip screw without evidence for hardware loosening. 3. Moderate degenerative changes of the right hip, pubic symphysis and sacroiliac joint. 4. Osteopenia. Electronically Signed   By: Darliss Cheney M.D.   On: 11/13/2023 20:37   CT Cervical Spine Wo Contrast Result Date: 11/13/2023 CLINICAL DATA:  Neck trauma.  Pain after fall. EXAM: CT CERVICAL SPINE WITHOUT CONTRAST TECHNIQUE: Multidetector CT imaging of the cervical spine was performed without intravenous contrast. Multiplanar CT image reconstructions were also generated. RADIATION DOSE REDUCTION: This exam was performed according to the departmental dose-optimization program which includes automated exposure control, adjustment of the mA and/or kV according to patient size and/or use of iterative reconstruction technique. COMPARISON:  Cervical spine CT 02/22/2023 FINDINGS: Alignment: No traumatic subluxation. Chronic grade 1 anterolisthesis of C3 on C4 and retrolisthesis of C4 on C5. Skull base and vertebrae: No acute fracture. Vertebral body heights are maintained. The dens and skull base are intact. None fusion posterior arch of C1. Moderate pannus at C1-C2. Soft tissues and spinal canal: No prevertebral fluid or swelling. No visible canal hematoma. Disc levels: Advanced diffuse degenerative disc disease and facet hypertrophy. Degenerative changes are grossly stable from prior. Upper chest: Heterogeneous pulmonary parenchyma which  is  in part chronic, but more prominent than on prior exam, raising concern for pulmonary edema. Other: Carotid calcifications. IMPRESSION: 1. No acute fracture or subluxation of the cervical spine. 2. Advanced diffuse degenerative disc disease and facet hypertrophy, grossly stable from prior. 3. Heterogeneous pulmonary parenchyma which is in part chronic, but more prominent than on prior exam, raising concern for pulmonary edema. Electronically Signed   By: Narda Rutherford M.D.   On: 11/13/2023 20:37   CT HEAD WO CONTRAST ( ) Result Date: 11/13/2023 CLINICAL DATA:  Pain after fall. EXAM: CT HEAD WITHOUT CONTRAST TECHNIQUE: Contiguous axial images were obtained from the base of the skull through the vertex without intravenous contrast. RADIATION DOSE REDUCTION: This exam was performed according to the departmental dose-optimization program which includes automated exposure control, adjustment of the mA and/or kV according to patient size and/or use of iterative reconstruction technique. COMPARISON:  Head CT 07/29/2023 FINDINGS: Brain: Patient is tilted in the scanner. No intracranial hemorrhage, mass effect, or midline shift. Age related atrophy. No hydrocephalus. The basilar cisterns are patent. No evidence of territorial infarct or acute ischemia. No extra-axial or intracranial fluid collection. Vascular: Atherosclerosis of skullbase vasculature without hyperdense vessel or abnormal calcification. Skull: No fracture or focal lesion. Sinuses/Orbits: Chronic opacification of left side of sphenoid sinus. No acute fracture. Bilateral cataract resection. Other: None. IMPRESSION: 1. No acute intracranial abnormality. No skull fracture. 2. Age related atrophy. Electronically Signed   By: Narda Rutherford M.D.   On: 11/13/2023 20:33   I have personally reviewed the images and agree with the above interpretation.  Labs:    Latest Ref Rng & Units 11/13/2023   11:28 PM 11/03/2023   12:08 PM 10/22/2023   12:39 PM   CBC  WBC 4.0 - 10.5 K/uL 11.1  5.2  7.1   Hemoglobin 12.0 - 15.0 g/dL 9.3  65.7  84.6   Hematocrit 36.0 - 46.0 % 28.9  31.2  36.0   Platelets 150 - 400 K/uL 173  268.0  157       Latest Ref Rng & Units 11/13/2023   11:28 PM 11/03/2023   12:08 PM 10/22/2023   12:39 PM  BMP  Glucose 70 - 99 mg/dL 962  81  87   BUN 8 - 23 mg/dL 23  18  25    Creatinine 0.44 - 1.00 mg/dL 9.52  8.41  3.24   Sodium 135 - 145 mmol/L 139  142  139   Potassium 3.5 - 5.1 mmol/L 4.7  4.4  4.0   Chloride 98 - 111 mmol/L 97  101  98   CO2 22 - 32 mmol/L 30  32  30   Calcium 8.9 - 10.3 mg/dL 9.4  9.4  9.3         Assessment and Plan: Ms. Bollig is a pleasant 89 y.o. female with a unstable distraction fracture of her lumbar spine with associated retroperitoneal hematoma and epidural hematoma.  Currently has preserved motor function in all testable muscle groups including her knees and bilateral ankles as well as her toe extensors.  She has intact sensation to light touch.  Reflex examination was difficult given patient's level of pain.  She is currently on logroll and strict spine precautions so did not check any range of motion.  Her CT of her lumbar spine shows a massive distraction fracture through the anterior posterior elements with continued dislocation and anterior distraction.  This represents a clearly unstable spinal fracture with high risk of neurologic demise.  Given the risk of bleeding, her cardiac and pulmonary history, and retroperitoneal hemorrhage, we do not have the resources to safely treat her here.  Her family is also requesting a transfer evaluation at [facility] as she is a cardiac and pulmonary patient within their system and the family lives in [place].  There was a recommendation for upright x-rays with a lumbar corset, I feel that there is a high level of chance that there would be a neurologic compromise with this testing and this should be done at a tertiary center in case she does have a demise and  needs immediate surgical intervention.  I also discussed with the patient's family member that this would be a case where I would strongly consider evaluation by palliative care as well.  Given her health history as well as the major morbidity and mortality associated with a fracture of this magnitude and her age there is a very high risk of surgical complication, postsurgical complication, anesthesia complication, neurologic demise.  They acknowledged understanding and would like to have further evaluation at a tertiary center.  After discussing this case with the emergency department team here at [facility], plan is for transfer to [facility] for evaluation for her ongoing spine care which we agree with.  Lovenia [name], MD/MSCR Dept. of Neurosurgery   MDM High: Life and function threatening illness, independent interpretation of test, evaluation of 3 spinal imaging exams, decision to transfer to a higher level of care.

## 2023-11-14 NOTE — Assessment & Plan Note (Signed)
 On metoprolol, amiodarone and eliquis  Holding eliquis in setting of unstable lumbar fracture

## 2023-11-14 NOTE — ED Provider Notes (Signed)
-----------------------------------------   2:15 AM on 11/14/2023 -----------------------------------------   Blood pressure 123/66, pulse (!) 141, temperature 98.2 F (36.8 C), temperature source Oral, resp. rate 20, height 5\' 2"  (1.575 m), weight 81.6 kg, SpO2 99%.  Received signout on patient.  89 year old female presenting today for mechanical fall.  Originally seen by different provider and found most notably to have acute L4 compression fracture.  Neurosurgery was consulted.  They discussed the case with patient and her family at bedside.  They discussed nonsurgical stabilization versus intervention.  Family requesting further discussion with [facility] for surgical consultation.  Plan will be for evaluation by [facility] team at 6:30 AM in the morning.  Patient given pain medicine overnight while awaiting further evaluation.  Separately, she has a history of A-fib with RVR.  Briefly went into A-fib with RVR and given her home doses of amiodarone and metoprolol along with 1 L fluids.  Patient signed out to oncoming provider in the morning while waiting for reassessment for possible transfer to [facility].  I suspect if she is not able to be transferred there, will need admission for ongoing pain control given multiple doses of fentanyl, morphine, and Ativan overnight.  Clinical Course as of 11/14/23 0217  Thu Nov 13, 2023  2057 CT Lumbar Spine Wo Contrast [JR]    Clinical Course User Index [JR] Therese Sarah      Janith Lima, MD 11/14/23 [phone]

## 2023-11-14 NOTE — Progress Notes (Signed)
 Orthopedic Tech Progress Note Patient Details:  Kimberly Roy June 08, 1931 [id number] Called [facility] into [facility] Patient ID: Kimberly Roy, female   DOB: [birth year], 89 y.o.   MRN: [id number]  Kimberly Roy 11/14/2023, 9:05 AM

## 2023-11-14 NOTE — ED Notes (Signed)
 pt has been accepted Ed To ED at [facility] [address], report number [phone], Accepting: Dr. Rocco Serene. Transfer coordinator: Adela Lank: [phone]  Calling [facility] for transportation.

## 2023-11-14 NOTE — Progress Notes (Signed)
 OT Cancellation Note  Patient Details Name: Kimberly Roy MRN: [id number] DOB: 12-20-1930   Cancelled Treatment:    Reason Eval/Treat Not Completed: Patient not medically ready. Order received, per chart review 11/14/23 Neurosurgery note states " On bedrest with strict spine precautions " and note plan for transfer to [facility]. Will complete orders at this time.   Kathie Dike, M.S. OTR/L  11/14/23, 10:55 AM  ascom [phone]

## 2023-11-14 NOTE — Progress Notes (Signed)
 Initial review of patient's chart demonstrates a 3 column distraction fracture with massive gap and angulation secondary to autofusion and ankylosing spondylitis/DISH arthritis.  Review of her imaging demonstrates that she has active separation of her anterior and posterior elements with distraction.  There is also hemorrhage noted to both in the retroperitoneum as well as the epidural space and intravertebral space.  There is a wide gap approximately the size of an entire vertebral body at the anterior portion.  At this point I would keep the patient on strict spine precautions.  [place]roll precautions.  There was initial recommendation for a upright x-ray with a corset brace, I do not feel comfortable with this plan here at our hospital should she have a acute compromise as she does have intact neurologic function at this time.  If plan is for upright x-rays with a lumbar corset she should have this done at a tertiary center.  Patient's family has requested transfer to [facility] as they get their care there for her heart and lungs.  Full consult note to follow.

## 2023-11-14 NOTE — ED Notes (Signed)
 [facility]  [facility]   called  spoke  with  Kimberly Roy

## 2023-11-14 NOTE — ED Notes (Signed)
 Pt is complaining of 10/10 back pain and is in obvious distress. Dr. Anner Crete made aware.

## 2023-11-14 NOTE — ED Notes (Signed)
 Pt is in extreme pain and discomfort upon waking up from sleeping. Dr. Anner Crete made aware and ativan was ordered for pt due to previous frequent doses of narcotics that do not seem to keep pt comfortable very long.

## 2023-11-14 NOTE — Progress Notes (Signed)
 PT Cancellation Note  Patient Details Name: Kimberly Roy MRN: [id number] DOB: 03-Aug-1931   Cancelled Treatment:    Reason Eval/Treat Not Completed: Patient not medically ready (Consult received and chart reviewed.  Patient not medically stable for PT assessment, on strict spine precautions with hifh risk of spinal injury; pending transfer to [facility] for additional care.  Will complete order at this time.)   Dora Clauss H. Manson Passey, PT, DPT, NCS 11/14/23, 11:07 AM [phone]

## 2023-11-14 NOTE — ED Notes (Signed)
 [facility] called [place] back , spoke with [email or web address] she stated she is waiting on a truck to free up, from [place] at [facility] and she will send the truck here for the pt.

## 2023-11-14 NOTE — Assessment & Plan Note (Deleted)
 PPI ?
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
## 2023-11-14 NOTE — ED Notes (Signed)
 S/W Kimberly Roy [facility]): she advised that case requires Admin Review; Review Committee convenes at 6:30 am. If approved, case is referred to [facility] team for review and determination of dispo. Family (Debbie=daughter) and Patient aware of status. S/W Kimberly Roy at 11:30 pm 3/13

## 2023-11-14 NOTE — ED Notes (Signed)
 Pt resting at this time, however cannot get fully comfortable with her back injury. Extra pillows were propped under pt for comfort. Pt's daughter still at bedside with pt.

## 2023-11-14 NOTE — Assessment & Plan Note (Signed)
 Appears euvolemic.  Monitor.

## 2023-11-15 DIAGNOSIS — R58 Hemorrhage, not elsewhere classified: Secondary | ICD-10-CM | POA: Diagnosis not present

## 2023-11-15 DIAGNOSIS — Z515 Encounter for palliative care: Secondary | ICD-10-CM | POA: Diagnosis not present

## 2023-11-15 DIAGNOSIS — S32042A Unstable burst fracture of fourth lumbar vertebra, initial encounter for closed fracture: Secondary | ICD-10-CM | POA: Diagnosis not present

## 2023-11-17 ENCOUNTER — Telehealth: Payer: Self-pay | Admitting: Internal Medicine

## 2023-11-17 NOTE — Telephone Encounter (Signed)
 Copied from CRM [id number]. Topic: Medicare AWV >> Nov 17, 2023 11:26 AM Payton Doughty wrote: Reason for CRM: Called LVM 11/17/2023 to schedule AWV. Please schedule office or virtual visits.  Verlee Rossetti; Care Guide Ambulatory Clinical Support [place] [facility] Direct Dial: [phone]

## 2023-11-27 ENCOUNTER — Ambulatory Visit: Payer: Medicare HMO | Admitting: Neurology

## 2023-12-02 DEATH — deceased

## 2023-12-11 ENCOUNTER — Ambulatory Visit: Payer: Medicare Other | Admitting: Internal Medicine

## 2024-02-18 ENCOUNTER — Other Ambulatory Visit: Payer: Medicare HMO

## 2024-02-18 ENCOUNTER — Ambulatory Visit: Payer: Medicare HMO | Admitting: Internal Medicine

## 2024-02-23 ENCOUNTER — Ambulatory Visit: Payer: Medicare HMO | Admitting: Dermatology

## 2024-07-21 NOTE — Telephone Encounter (Signed)
 open in error
